# Patient Record
Sex: Female | Born: 1935 | Race: Black or African American | Hispanic: No | Marital: Single | Smoking: Former smoker
Health system: Southern US, Community
[De-identification: ages and names within clinical notes are randomized; demographics above are authoritative.]

## PROBLEM LIST (undated history)

## (undated) DIAGNOSIS — M25511 Pain in right shoulder: Secondary | ICD-10-CM

## (undated) DIAGNOSIS — E785 Hyperlipidemia, unspecified: Secondary | ICD-10-CM

## (undated) DIAGNOSIS — I509 Heart failure, unspecified: Secondary | ICD-10-CM

## (undated) DIAGNOSIS — I4891 Unspecified atrial fibrillation: Secondary | ICD-10-CM

## (undated) DIAGNOSIS — N3281 Overactive bladder: Secondary | ICD-10-CM

## (undated) DIAGNOSIS — R001 Bradycardia, unspecified: Secondary | ICD-10-CM

## (undated) DIAGNOSIS — R319 Hematuria, unspecified: Secondary | ICD-10-CM

## (undated) DIAGNOSIS — C801 Malignant (primary) neoplasm, unspecified: Secondary | ICD-10-CM

## (undated) DIAGNOSIS — J449 Chronic obstructive pulmonary disease, unspecified: Secondary | ICD-10-CM

## (undated) DIAGNOSIS — M549 Dorsalgia, unspecified: Secondary | ICD-10-CM

## (undated) DIAGNOSIS — N189 Chronic kidney disease, unspecified: Secondary | ICD-10-CM

## (undated) DIAGNOSIS — N952 Postmenopausal atrophic vaginitis: Secondary | ICD-10-CM

## (undated) DIAGNOSIS — N390 Urinary tract infection, site not specified: Secondary | ICD-10-CM

## (undated) DIAGNOSIS — E860 Dehydration: Secondary | ICD-10-CM

## (undated) DIAGNOSIS — M109 Gout, unspecified: Secondary | ICD-10-CM

## (undated) DIAGNOSIS — R350 Frequency of micturition: Secondary | ICD-10-CM

## (undated) DIAGNOSIS — G8929 Other chronic pain: Secondary | ICD-10-CM

## (undated) DIAGNOSIS — C50919 Malignant neoplasm of unspecified site of unspecified female breast: Secondary | ICD-10-CM

## (undated) DIAGNOSIS — L8990 Pressure ulcer of unspecified site, unspecified stage: Secondary | ICD-10-CM

## (undated) DIAGNOSIS — IMO0002 Reserved for concepts with insufficient information to code with codable children: Secondary | ICD-10-CM

## (undated) DIAGNOSIS — G8918 Other acute postprocedural pain: Secondary | ICD-10-CM

## (undated) DIAGNOSIS — B1920 Unspecified viral hepatitis C without hepatic coma: Secondary | ICD-10-CM

## (undated) DIAGNOSIS — M25512 Pain in left shoulder: Secondary | ICD-10-CM

## (undated) DIAGNOSIS — M1990 Unspecified osteoarthritis, unspecified site: Secondary | ICD-10-CM

## (undated) DIAGNOSIS — I10 Essential (primary) hypertension: Secondary | ICD-10-CM

## (undated) DIAGNOSIS — I499 Cardiac arrhythmia, unspecified: Secondary | ICD-10-CM

## (undated) DIAGNOSIS — G2581 Restless legs syndrome: Secondary | ICD-10-CM

## (undated) HISTORY — DX: Unspecified viral hepatitis C without hepatic coma: B19.20

## (undated) HISTORY — PX: COLON SURGERY: SHX602

## (undated) HISTORY — PX: BREAST SURGERY: SHX581

## (undated) HISTORY — DX: Hypomagnesemia: E83.42

## (undated) HISTORY — DX: Cardiac arrhythmia, unspecified: I49.9

## (undated) HISTORY — DX: Urinary tract infection, site not specified: N39.0

## (undated) HISTORY — PX: ABDOMINAL HYSTERECTOMY: SHX81

## (undated) HISTORY — PX: BACK SURGERY: SHX140

## (undated) HISTORY — DX: Postmenopausal atrophic vaginitis: N95.2

## (undated) HISTORY — DX: Overactive bladder: N32.81

## (undated) HISTORY — DX: Chronic kidney disease, unspecified: N18.9

## (undated) HISTORY — PX: INSERT / REPLACE / REMOVE PACEMAKER: SUR710

## (undated) HISTORY — DX: Reserved for concepts with insufficient information to code with codable children: IMO0002

## (undated) HISTORY — DX: Hematuria, unspecified: R31.9

## (undated) HISTORY — PX: ROTATOR CUFF REPAIR: SHX139

## (undated) HISTORY — DX: Gout, unspecified: M10.9

## (undated) HISTORY — PX: CHOLECYSTECTOMY: SHX55

## (undated) HISTORY — DX: Other acute postprocedural pain: G89.18

## (undated) HISTORY — DX: Dehydration: E86.0

## (undated) HISTORY — DX: Frequency of micturition: R35.0

## (undated) HISTORY — PX: CARPAL TUNNEL RELEASE: SHX101

## (undated) HISTORY — DX: Bradycardia, unspecified: R00.1

## (undated) HISTORY — PX: JOINT REPLACEMENT: SHX530

---

## 2001-11-01 ENCOUNTER — Encounter: Payer: Self-pay | Admitting: Internal Medicine

## 2001-11-01 ENCOUNTER — Ambulatory Visit (HOSPITAL_COMMUNITY): Admission: RE | Admit: 2001-11-01 | Discharge: 2001-11-01 | Payer: Self-pay | Admitting: Internal Medicine

## 2002-08-23 DIAGNOSIS — C801 Malignant (primary) neoplasm, unspecified: Secondary | ICD-10-CM

## 2002-08-23 DIAGNOSIS — C50919 Malignant neoplasm of unspecified site of unspecified female breast: Secondary | ICD-10-CM

## 2002-08-23 HISTORY — DX: Malignant (primary) neoplasm, unspecified: C80.1

## 2002-08-23 HISTORY — PX: BREAST LUMPECTOMY: SHX2

## 2002-08-23 HISTORY — DX: Malignant neoplasm of unspecified site of unspecified female breast: C50.919

## 2003-03-28 ENCOUNTER — Encounter: Payer: Self-pay | Admitting: Internal Medicine

## 2003-03-28 ENCOUNTER — Ambulatory Visit (HOSPITAL_COMMUNITY): Admission: RE | Admit: 2003-03-28 | Discharge: 2003-03-28 | Payer: Self-pay | Admitting: Internal Medicine

## 2003-04-11 ENCOUNTER — Encounter: Payer: Self-pay | Admitting: Internal Medicine

## 2003-04-11 ENCOUNTER — Ambulatory Visit (HOSPITAL_COMMUNITY): Admission: RE | Admit: 2003-04-11 | Discharge: 2003-04-11 | Payer: Self-pay | Admitting: Internal Medicine

## 2003-04-18 ENCOUNTER — Encounter (INDEPENDENT_AMBULATORY_CARE_PROVIDER_SITE_OTHER): Payer: Self-pay | Admitting: Diagnostic Radiology

## 2003-04-18 ENCOUNTER — Encounter (INDEPENDENT_AMBULATORY_CARE_PROVIDER_SITE_OTHER): Payer: Self-pay

## 2003-04-18 ENCOUNTER — Encounter: Payer: Self-pay | Admitting: Internal Medicine

## 2003-04-18 ENCOUNTER — Encounter: Admission: RE | Admit: 2003-04-18 | Discharge: 2003-04-18 | Payer: Self-pay | Admitting: Internal Medicine

## 2003-08-20 ENCOUNTER — Other Ambulatory Visit: Payer: Self-pay

## 2004-06-15 ENCOUNTER — Ambulatory Visit: Payer: Self-pay | Admitting: Oncology

## 2004-06-23 ENCOUNTER — Ambulatory Visit: Payer: Self-pay | Admitting: Oncology

## 2004-09-01 ENCOUNTER — Ambulatory Visit: Payer: Self-pay | Admitting: Unknown Physician Specialty

## 2004-09-14 ENCOUNTER — Ambulatory Visit: Payer: Self-pay | Admitting: Oncology

## 2004-09-23 ENCOUNTER — Ambulatory Visit: Payer: Self-pay | Admitting: Oncology

## 2004-10-23 ENCOUNTER — Ambulatory Visit: Payer: Self-pay | Admitting: General Surgery

## 2004-12-14 ENCOUNTER — Ambulatory Visit: Payer: Self-pay | Admitting: Oncology

## 2004-12-21 ENCOUNTER — Ambulatory Visit: Payer: Self-pay | Admitting: Oncology

## 2005-02-01 ENCOUNTER — Ambulatory Visit: Payer: Self-pay | Admitting: Oncology

## 2005-05-03 ENCOUNTER — Ambulatory Visit: Payer: Self-pay | Admitting: General Surgery

## 2005-05-05 ENCOUNTER — Ambulatory Visit: Payer: Self-pay | Admitting: Oncology

## 2005-05-23 ENCOUNTER — Ambulatory Visit: Payer: Self-pay | Admitting: Oncology

## 2005-07-29 ENCOUNTER — Ambulatory Visit: Payer: Self-pay | Admitting: Physician Assistant

## 2005-08-10 ENCOUNTER — Ambulatory Visit: Payer: Self-pay | Admitting: Oncology

## 2005-08-23 ENCOUNTER — Ambulatory Visit: Payer: Self-pay | Admitting: Oncology

## 2005-11-09 ENCOUNTER — Ambulatory Visit: Payer: Self-pay | Admitting: Oncology

## 2005-11-23 ENCOUNTER — Ambulatory Visit: Payer: Self-pay | Admitting: General Surgery

## 2005-11-24 ENCOUNTER — Ambulatory Visit: Payer: Self-pay | Admitting: Oncology

## 2005-12-02 ENCOUNTER — Ambulatory Visit: Payer: Self-pay | Admitting: General Surgery

## 2006-01-13 ENCOUNTER — Emergency Department: Payer: Self-pay | Admitting: Emergency Medicine

## 2006-02-07 ENCOUNTER — Ambulatory Visit: Payer: Self-pay | Admitting: Oncology

## 2006-05-09 ENCOUNTER — Ambulatory Visit: Payer: Self-pay | Admitting: Oncology

## 2006-05-23 ENCOUNTER — Ambulatory Visit: Payer: Self-pay | Admitting: Oncology

## 2006-05-23 ENCOUNTER — Ambulatory Visit: Payer: Self-pay | Admitting: General Surgery

## 2006-08-08 ENCOUNTER — Ambulatory Visit: Payer: Self-pay | Admitting: Oncology

## 2006-08-23 ENCOUNTER — Ambulatory Visit: Payer: Self-pay | Admitting: Oncology

## 2006-10-17 ENCOUNTER — Ambulatory Visit: Payer: Self-pay | Admitting: Unknown Physician Specialty

## 2006-11-08 ENCOUNTER — Ambulatory Visit: Payer: Self-pay | Admitting: Oncology

## 2006-11-09 ENCOUNTER — Ambulatory Visit: Payer: Self-pay | Admitting: Oncology

## 2006-11-22 ENCOUNTER — Ambulatory Visit: Payer: Self-pay | Admitting: Oncology

## 2006-11-25 ENCOUNTER — Emergency Department: Payer: Self-pay | Admitting: Unknown Physician Specialty

## 2006-11-25 ENCOUNTER — Other Ambulatory Visit: Payer: Self-pay

## 2006-11-28 ENCOUNTER — Ambulatory Visit: Payer: Self-pay | Admitting: Oncology

## 2006-12-22 ENCOUNTER — Ambulatory Visit: Payer: Self-pay | Admitting: Oncology

## 2007-02-21 ENCOUNTER — Ambulatory Visit: Payer: Self-pay | Admitting: Oncology

## 2007-03-01 ENCOUNTER — Ambulatory Visit: Payer: Self-pay | Admitting: Oncology

## 2007-03-24 ENCOUNTER — Ambulatory Visit: Payer: Self-pay | Admitting: Oncology

## 2007-04-24 ENCOUNTER — Ambulatory Visit: Payer: Self-pay | Admitting: Oncology

## 2007-05-24 ENCOUNTER — Ambulatory Visit: Payer: Self-pay | Admitting: Oncology

## 2007-06-24 ENCOUNTER — Ambulatory Visit: Payer: Self-pay | Admitting: Oncology

## 2007-07-24 ENCOUNTER — Ambulatory Visit: Payer: Self-pay | Admitting: Oncology

## 2007-08-23 ENCOUNTER — Ambulatory Visit: Payer: Self-pay | Admitting: Oncology

## 2007-08-24 ENCOUNTER — Ambulatory Visit: Payer: Self-pay | Admitting: Oncology

## 2007-09-07 ENCOUNTER — Other Ambulatory Visit: Payer: Self-pay

## 2007-09-08 ENCOUNTER — Inpatient Hospital Stay: Payer: Self-pay | Admitting: Internal Medicine

## 2007-11-23 ENCOUNTER — Ambulatory Visit: Payer: Self-pay | Admitting: Oncology

## 2007-12-22 ENCOUNTER — Ambulatory Visit: Payer: Self-pay | Admitting: Oncology

## 2008-02-21 ENCOUNTER — Ambulatory Visit: Payer: Self-pay | Admitting: Oncology

## 2008-02-25 ENCOUNTER — Emergency Department: Payer: Self-pay | Admitting: Emergency Medicine

## 2008-03-26 ENCOUNTER — Ambulatory Visit: Payer: Self-pay | Admitting: Unknown Physician Specialty

## 2008-05-01 ENCOUNTER — Ambulatory Visit: Payer: Self-pay | Admitting: Pain Medicine

## 2008-05-06 ENCOUNTER — Ambulatory Visit: Payer: Self-pay | Admitting: Pain Medicine

## 2008-05-09 ENCOUNTER — Ambulatory Visit: Payer: Self-pay | Admitting: Pain Medicine

## 2008-05-23 ENCOUNTER — Ambulatory Visit: Payer: Self-pay | Admitting: Oncology

## 2008-06-05 ENCOUNTER — Ambulatory Visit: Payer: Self-pay | Admitting: General Surgery

## 2008-06-06 ENCOUNTER — Ambulatory Visit: Payer: Self-pay | Admitting: Physician Assistant

## 2008-06-13 ENCOUNTER — Ambulatory Visit: Payer: Self-pay | Admitting: Pain Medicine

## 2008-06-20 ENCOUNTER — Ambulatory Visit: Payer: Self-pay | Admitting: Oncology

## 2008-06-23 ENCOUNTER — Ambulatory Visit: Payer: Self-pay | Admitting: Oncology

## 2008-06-27 ENCOUNTER — Ambulatory Visit: Payer: Self-pay | Admitting: Pain Medicine

## 2008-07-11 ENCOUNTER — Ambulatory Visit: Payer: Self-pay | Admitting: Physician Assistant

## 2008-07-11 ENCOUNTER — Ambulatory Visit: Payer: Self-pay | Admitting: Pain Medicine

## 2008-07-30 ENCOUNTER — Ambulatory Visit: Payer: Self-pay | Admitting: Pain Medicine

## 2008-08-26 ENCOUNTER — Ambulatory Visit: Payer: Self-pay | Admitting: Pain Medicine

## 2008-10-07 ENCOUNTER — Ambulatory Visit: Payer: Self-pay | Admitting: Pain Medicine

## 2008-10-24 ENCOUNTER — Ambulatory Visit: Payer: Self-pay | Admitting: Pain Medicine

## 2008-10-28 ENCOUNTER — Ambulatory Visit: Payer: Self-pay | Admitting: Unknown Physician Specialty

## 2008-11-07 ENCOUNTER — Ambulatory Visit: Payer: Self-pay | Admitting: Physician Assistant

## 2008-12-16 ENCOUNTER — Ambulatory Visit: Payer: Self-pay | Admitting: Physician Assistant

## 2008-12-24 ENCOUNTER — Ambulatory Visit: Payer: Self-pay | Admitting: Pain Medicine

## 2009-01-07 ENCOUNTER — Ambulatory Visit: Payer: Self-pay | Admitting: Physician Assistant

## 2009-02-05 ENCOUNTER — Ambulatory Visit: Payer: Self-pay | Admitting: Physician Assistant

## 2009-02-11 ENCOUNTER — Ambulatory Visit: Payer: Self-pay | Admitting: Pain Medicine

## 2009-03-03 ENCOUNTER — Ambulatory Visit: Payer: Self-pay | Admitting: Pain Medicine

## 2009-03-17 ENCOUNTER — Ambulatory Visit: Payer: Self-pay | Admitting: Pain Medicine

## 2009-04-01 ENCOUNTER — Ambulatory Visit: Payer: Self-pay | Admitting: Pain Medicine

## 2009-04-02 ENCOUNTER — Ambulatory Visit: Payer: Self-pay | Admitting: Cardiovascular Disease

## 2009-04-02 ENCOUNTER — Ambulatory Visit: Payer: Self-pay | Admitting: Unknown Physician Specialty

## 2009-04-08 ENCOUNTER — Inpatient Hospital Stay: Payer: Self-pay | Admitting: Unknown Physician Specialty

## 2009-04-16 ENCOUNTER — Ambulatory Visit: Payer: Self-pay | Admitting: Physician Assistant

## 2009-05-07 ENCOUNTER — Encounter: Payer: Self-pay | Admitting: Unknown Physician Specialty

## 2009-05-08 ENCOUNTER — Ambulatory Visit: Payer: Self-pay | Admitting: Physician Assistant

## 2009-05-23 ENCOUNTER — Ambulatory Visit: Payer: Self-pay | Admitting: Oncology

## 2009-05-26 ENCOUNTER — Ambulatory Visit: Payer: Self-pay | Admitting: Oncology

## 2009-05-27 ENCOUNTER — Encounter: Payer: Self-pay | Admitting: Unknown Physician Specialty

## 2009-06-04 ENCOUNTER — Ambulatory Visit: Payer: Self-pay | Admitting: Physician Assistant

## 2009-06-09 ENCOUNTER — Ambulatory Visit: Payer: Self-pay | Admitting: General Surgery

## 2009-06-23 ENCOUNTER — Ambulatory Visit: Payer: Self-pay | Admitting: Oncology

## 2009-06-25 ENCOUNTER — Encounter: Payer: Self-pay | Admitting: Unknown Physician Specialty

## 2009-07-10 ENCOUNTER — Ambulatory Visit: Payer: Self-pay | Admitting: Physician Assistant

## 2009-07-21 ENCOUNTER — Ambulatory Visit: Payer: Self-pay | Admitting: Unknown Physician Specialty

## 2009-07-22 ENCOUNTER — Ambulatory Visit: Payer: Self-pay | Admitting: Pain Medicine

## 2009-07-24 ENCOUNTER — Encounter: Payer: Self-pay | Admitting: Unknown Physician Specialty

## 2009-08-05 ENCOUNTER — Ambulatory Visit: Payer: Self-pay | Admitting: Cardiology

## 2009-08-05 ENCOUNTER — Ambulatory Visit: Payer: Self-pay | Admitting: Unknown Physician Specialty

## 2009-08-07 ENCOUNTER — Inpatient Hospital Stay: Payer: Self-pay | Admitting: Unknown Physician Specialty

## 2009-08-23 ENCOUNTER — Encounter: Payer: Self-pay | Admitting: Unknown Physician Specialty

## 2009-09-04 ENCOUNTER — Encounter: Payer: Self-pay | Admitting: Unknown Physician Specialty

## 2009-09-11 ENCOUNTER — Ambulatory Visit: Payer: Self-pay | Admitting: Physician Assistant

## 2009-09-23 ENCOUNTER — Encounter: Payer: Self-pay | Admitting: Unknown Physician Specialty

## 2009-10-06 ENCOUNTER — Ambulatory Visit: Payer: Self-pay | Admitting: Pain Medicine

## 2009-10-21 ENCOUNTER — Encounter: Payer: Self-pay | Admitting: Unknown Physician Specialty

## 2009-11-21 ENCOUNTER — Ambulatory Visit: Payer: Self-pay | Admitting: Oncology

## 2009-11-24 ENCOUNTER — Ambulatory Visit: Payer: Self-pay | Admitting: Oncology

## 2009-12-21 ENCOUNTER — Ambulatory Visit: Payer: Self-pay | Admitting: Oncology

## 2010-02-26 ENCOUNTER — Ambulatory Visit: Payer: Self-pay | Admitting: Pain Medicine

## 2010-06-10 ENCOUNTER — Ambulatory Visit: Payer: Self-pay | Admitting: Oncology

## 2010-06-12 ENCOUNTER — Ambulatory Visit: Payer: Self-pay | Admitting: Oncology

## 2010-06-19 ENCOUNTER — Ambulatory Visit: Payer: Self-pay | Admitting: Oncology

## 2010-06-23 ENCOUNTER — Ambulatory Visit: Payer: Self-pay | Admitting: Oncology

## 2010-07-06 ENCOUNTER — Ambulatory Visit: Payer: Self-pay | Admitting: Pain Medicine

## 2010-07-09 ENCOUNTER — Ambulatory Visit: Payer: Self-pay | Admitting: Pain Medicine

## 2010-07-27 ENCOUNTER — Ambulatory Visit: Payer: Self-pay | Admitting: Pain Medicine

## 2010-07-27 ENCOUNTER — Ambulatory Visit: Payer: Self-pay | Admitting: Oncology

## 2010-07-30 ENCOUNTER — Ambulatory Visit: Payer: Self-pay | Admitting: Oncology

## 2010-08-23 ENCOUNTER — Ambulatory Visit: Payer: Self-pay | Admitting: Oncology

## 2010-08-27 ENCOUNTER — Ambulatory Visit: Payer: Self-pay | Admitting: Pain Medicine

## 2010-09-11 ENCOUNTER — Ambulatory Visit: Payer: Self-pay | Admitting: Oncology

## 2010-09-12 LAB — CANCER ANTIGEN 27.29: CA 27.29: 90.4 U/mL — ABNORMAL HIGH (ref 0.0–38.6)

## 2010-09-23 ENCOUNTER — Ambulatory Visit: Payer: Self-pay | Admitting: Oncology

## 2010-09-28 ENCOUNTER — Ambulatory Visit: Payer: Self-pay | Admitting: Pain Medicine

## 2010-10-06 ENCOUNTER — Ambulatory Visit: Payer: Self-pay | Admitting: Pain Medicine

## 2010-10-13 ENCOUNTER — Ambulatory Visit: Payer: Self-pay | Admitting: Oncology

## 2010-10-14 LAB — CANCER ANTIGEN 27.29: CA 27.29: 68.3 U/mL — ABNORMAL HIGH (ref 0.0–38.6)

## 2010-10-19 ENCOUNTER — Ambulatory Visit: Payer: Self-pay | Admitting: Pain Medicine

## 2010-10-22 ENCOUNTER — Ambulatory Visit: Payer: Self-pay | Admitting: Oncology

## 2010-12-28 ENCOUNTER — Other Ambulatory Visit: Payer: Self-pay | Admitting: Orthopedic Surgery

## 2010-12-28 DIAGNOSIS — M25512 Pain in left shoulder: Secondary | ICD-10-CM

## 2011-01-04 ENCOUNTER — Ambulatory Visit
Admission: RE | Admit: 2011-01-04 | Discharge: 2011-01-04 | Disposition: A | Discharge status: Home / Self Care | Payer: Medicare Other | Source: Ambulatory Visit | Attending: Orthopedic Surgery | Admitting: Orthopedic Surgery

## 2011-01-04 DIAGNOSIS — M25512 Pain in left shoulder: Secondary | ICD-10-CM

## 2011-01-11 ENCOUNTER — Ambulatory Visit: Payer: Self-pay | Admitting: Pain Medicine

## 2011-01-11 ENCOUNTER — Ambulatory Visit: Payer: Self-pay | Admitting: Oncology

## 2011-01-12 LAB — CANCER ANTIGEN 27.29: CA 27.29: 72.7 U/mL — ABNORMAL HIGH (ref 0.0–38.6)

## 2011-01-21 ENCOUNTER — Ambulatory Visit: Payer: Self-pay | Admitting: Pain Medicine

## 2011-01-22 ENCOUNTER — Ambulatory Visit: Payer: Self-pay | Admitting: Oncology

## 2011-01-28 ENCOUNTER — Other Ambulatory Visit: Payer: Self-pay | Admitting: Pain Medicine

## 2011-01-29 ENCOUNTER — Ambulatory Visit: Payer: Self-pay | Admitting: Oncology

## 2011-02-16 ENCOUNTER — Other Ambulatory Visit: Payer: Self-pay | Admitting: Pain Medicine

## 2011-02-17 ENCOUNTER — Encounter (HOSPITAL_COMMUNITY)
Admission: RE | Admit: 2011-02-17 | Discharge: 2011-02-17 | Disposition: A | Discharge status: Home / Self Care | Payer: Medicare Other | Source: Ambulatory Visit | Attending: Orthopedic Surgery | Admitting: Orthopedic Surgery

## 2011-02-17 LAB — URINALYSIS, ROUTINE W REFLEX MICROSCOPIC
Hgb urine dipstick: NEGATIVE
Nitrite: NEGATIVE
Protein, ur: NEGATIVE mg/dL
Specific Gravity, Urine: 1.022 (ref 1.005–1.030)
Urobilinogen, UA: 0.2 mg/dL (ref 0.0–1.0)

## 2011-02-17 LAB — CBC
HCT: 38.4 % (ref 36.0–46.0)
Hemoglobin: 12.6 g/dL (ref 12.0–15.0)
MCHC: 32.8 g/dL (ref 30.0–36.0)
RBC: 4.22 MIL/uL (ref 3.87–5.11)

## 2011-02-17 LAB — BASIC METABOLIC PANEL
BUN: 12 mg/dL (ref 6–23)
CO2: 26 mEq/L (ref 19–32)
Calcium: 9.9 mg/dL (ref 8.4–10.5)
Chloride: 106 mEq/L (ref 96–112)
Creatinine, Ser: 0.9 mg/dL (ref 0.50–1.10)
Glucose, Bld: 88 mg/dL (ref 70–99)

## 2011-02-17 LAB — URINE MICROSCOPIC-ADD ON

## 2011-02-17 LAB — APTT: aPTT: 32 seconds (ref 24–37)

## 2011-02-17 LAB — PROTIME-INR: INR: 1.07 (ref 0.00–1.49)

## 2011-02-17 LAB — DIFFERENTIAL
Basophils Absolute: 0.1 10*3/uL (ref 0.0–0.1)
Lymphocytes Relative: 36 % (ref 12–46)
Monocytes Absolute: 0.7 10*3/uL (ref 0.1–1.0)
Neutro Abs: 4.4 10*3/uL (ref 1.7–7.7)
Neutrophils Relative %: 51 % (ref 43–77)

## 2011-02-19 ENCOUNTER — Ambulatory Visit (HOSPITAL_COMMUNITY): Payer: Medicare Other

## 2011-02-19 ENCOUNTER — Inpatient Hospital Stay (HOSPITAL_COMMUNITY)
Admission: RE | Admit: 2011-02-19 | Discharge: 2011-02-21 | DRG: 484 | Disposition: A | Discharge status: Home / Self Care | Payer: Medicare Other | Source: Ambulatory Visit | Attending: Orthopedic Surgery | Admitting: Orthopedic Surgery

## 2011-02-19 DIAGNOSIS — M719 Bursopathy, unspecified: Secondary | ICD-10-CM | POA: Diagnosis present

## 2011-02-19 DIAGNOSIS — M19019 Primary osteoarthritis, unspecified shoulder: Principal | ICD-10-CM | POA: Diagnosis present

## 2011-02-19 DIAGNOSIS — M67919 Unspecified disorder of synovium and tendon, unspecified shoulder: Secondary | ICD-10-CM | POA: Diagnosis present

## 2011-02-19 DIAGNOSIS — Z01812 Encounter for preprocedural laboratory examination: Secondary | ICD-10-CM

## 2011-02-19 DIAGNOSIS — Z79899 Other long term (current) drug therapy: Secondary | ICD-10-CM

## 2011-02-20 LAB — BASIC METABOLIC PANEL
BUN: 12 mg/dL (ref 6–23)
CO2: 24 mEq/L (ref 19–32)
Calcium: 9 mg/dL (ref 8.4–10.5)
Chloride: 108 mEq/L (ref 96–112)
Creatinine, Ser: 0.92 mg/dL (ref 0.50–1.10)

## 2011-02-20 LAB — TYPE AND SCREEN
DAT, IgG: NEGATIVE
PT AG Type: NEGATIVE
Unit division: 0
Unit division: 0

## 2011-02-20 LAB — CBC
HCT: 34.2 % — ABNORMAL LOW (ref 36.0–46.0)
MCH: 29.3 pg (ref 26.0–34.0)
MCV: 90.2 fL (ref 78.0–100.0)
Platelets: 179 10*3/uL (ref 150–400)
RBC: 3.79 MIL/uL — ABNORMAL LOW (ref 3.87–5.11)
WBC: 9.1 10*3/uL (ref 4.0–10.5)

## 2011-03-12 NOTE — Op Note (Signed)
NAMESOOJIN, FARAHANI                 ACCOUNT NO.:  [id number]  MEDICAL RECORD NO.:  [id number]  LOCATION:  [id number]                         FACILITY:  [facility]  PHYSICIAN:  Doran Heater. Veverly Fells, M.D. DATE OF BIRTH:  1930-06-05  DATE OF PROCEDURE:  02/19/2011 DATE OF DISCHARGE:                              OPERATIVE REPORT   PREOPERATIVE DIAGNOSIS:  Left shoulder rotator cuff tear arthropathy.  POSTOPERATIVE DIAGNOSIS:  Left shoulder rotator cuff tear arthropathy.  PROCEDURE PERFORMED:  Left shoulder reverse total shoulder arthroplasty using DePuy Delta Xtend prosthesis.  SURGEON:  Doran Heater. Veverly Fells, MD  ASSISTANT:  Abbott Pao. Dixon, PA-C  ANESTHESIA:  General anesthesia was used plus interscalene block.  ESTIMATED BLOOD LOSS:  Minimal.  FLUID REPLACEMENT:  1200 mL crystalloid.  Instrument counts correct.  There were no complications.  Perioperative antibiotics were given.  INDICATION:  The patient is a 77 year old female with worsening left shoulder pain secondary to rotator cuff tear arthropathy.  The patient has had progressive pain despite conservative management and has a very little function of that shoulder.  The patient also has had prior right shoulder surgery and has poor function with that.  She presents for evaluation of her left shoulder and for total shoulder arthroplasty, reverse to improve function and eliminate pain.  Informed consent was obtained.  DESCRIPTION OF PROCEDURE:  After adequate level of anesthesia was achieved, the patient was positioned in the modified beach-chair position.  Left shoulder was properly identified.  Time-out was called and then sterile prep and drape performed.  A deltopectoral approach utilized during the coracoid process extending down to the anterior humeral shaft.  Dissection down through subcutaneous tissues using Bovie.  The deltopectoral interval was identified and the cephalic vein was taken laterally the deltoid.  Upper  centimeter pectoralis insertion of the humerus was released.  Conjoined tendon identified, mobilized medially.  Subscapularis was taken down subperiosteally off the lesser tuberosity and tagged with the FiberWire suture.  We then performed anterior inferior capsulectomy and released off the humeral neck and also released a little bit of the anterior supraspinatus tendon.  There was some degenerative tendon that was nonfunctional and was allowing the patient to articulate with the acromion that was removed.  The teres minor was kept intact posteriorly.  At this point, we delivered the head on the wound and reamed up to size 12 reamer and then placed our cutting block off of it and a 12 intramedullary guide and resected at 10 degrees retroversion.  We next went ahead and retract the humerus posteriorly, did a 360-degree release of soft tissue off the glenoid careful to protect the axillary nerve.  Next, we went ahead and placed our central guide pin and then reamed down to try to get sufficient bone support for the metaglene and it should be noted the patient had a false basically acetabularized superior glenoid and so we could not get all the way down to where we had 360-degree support for the metaglene, but greater than 190 support was felt to be sufficient with three good screws and then a screw functioning like the pillar on the top part that we locked.  We again got  down as far as we felt like we could we gone any further would compromise the inferior bone and support for the metaglene.  We went and ahead and drilled our central lug, our central peg hole and then impacted our metaglene in position, again had three nice flush screw holes, the inferior three, superior one was off about a millimeter 2. We went ahead and then placed three locked screws of 48 inferior lock screw an then two nonlocked anterior-posterior 18 screws and then another 30 locked proximally.  Once we locked those  screws, we had a nice secure metaglene, we then went ahead and inserted a 38 standard glenosphere and then tied that in position.  We then directed our attention towards the humerus.  We reamed for a one at the left size 12 stem and then and placed that trial prosthesis 10 degrees retroverted and put a 38 +3 poly and reduced the shoulder and we were happy with soft tissue balancing intention.  We dislocated that.  Went ahead and placed our real size 12 hydroxyapatite coated with a EPI1 left metaphyseal portion tightened that into position and impacted that down with some impaction grafting using available bone graft.  We had a nice secure fit with no significant bony osteophytes were noted.  At this point, we went ahead and impacted our +3 38 polyethylene into place and then reduced the shoulder, thoroughly irrigated and then closed the deltopectoral interval and we noted the conjoined tendon be appropriate tension.  No gapping with the inferior pole or with external rotation. We repaired the deltopectoral interval with 0 Vicryl suture followed by 2-0 Vicryl subcutaneous closure and 4-0 Monocryl for skin.  Steri-Strips applied followed by sterile dressing.  The patient tolerated surgery well.  I should note repair of the subscapularis back through bone with the #2 FiberWire suture.  The subscap appeared to be in very good shape with nice muscle that balance and felt like that would add some increased to enhance stability and potentially increase function, that was repaired anatomically to the lesser tuberosity.     Doran Heater. Veverly Fells, M.D.     SRN/MEDQ  D:  02/19/2011  T:  02/20/2011  Job:  [id number]  Electronically Signed by Esmond Plants  on 03/12/2011 12:07:38 AM

## 2011-04-09 NOTE — Discharge Summary (Signed)
  Kristin [name], Kristin [name]                 ACCOUNT NO.:  [id number]  MEDICAL RECORD NO.:  [id number]  LOCATION:                                 FACILITY:  PHYSICIAN:  Doran Heater. Veverly Fells, M.D. DATE OF BIRTH:  1930/07/23  DATE OF ADMISSION: DATE OF DISCHARGE:                              DISCHARGE SUMMARY   ADMISSION DIAGNOSIS:  Left shoulder pain secondary to rotator cuff arthropathy.  DISCHARGE DIAGNOSIS:  Left shoulder pain secondary to rotator cuff arthropathy.  BRIEF HISTORY:  The patient is a 77 year old female with worsening left shoulder pain and function secondary to rotator cuff arthropathy.  The patient was elected to have a left total shoulder arthroplasty reversed to decrease pain and increase function.  PROCEDURE:  The patient had a left total shoulder arthroplasty reversed performed by Dr. Esmond Plants on February 19, 2011.  ASSISTANT:  Abbott Pao. Dixon, PA-C  ANESTHESIA:  General anesthesia was used.  COMPLICATIONS:  None.  HOSPITAL COURSE:  The patient was admitted on February 19, 2011, for the above-stated procedure which she tolerated well.  After adequate time in [facility], she was transferred up to 5000.  Postop day 1, the patient complained about minimal pain to that shoulder. Neurovascularly, she is intact.  Labs were in acceptable limits.  She was able to work with occupational and physical therapy quite well over the next couple of days and thus the patient will be discharged home on postop day 2, on February 21, 2011.  CONDITION:  Stable.  DIET:  Regular.  DISCHARGE MEDICATIONS: 1. Percocet 5/325 one to two tabs q.4-6 h. p.r.n. pain. 2. Robaxin 500 mg p.o. q.6 h.     Thomas B. Dixon, P.A.   ______________________________ Doran Heater. Veverly Fells, M.D.    TBD/MEDQ  D:  03/11/2011  T:  03/11/2011  Job:  [id number]  Electronically Signed by Shelle Iron P.A. on 03/16/2011 07:52:24 AM Electronically Signed by Esmond Plants  on 04/09/2011 10:15:57 AM

## 2011-04-12 ENCOUNTER — Ambulatory Visit: Payer: Self-pay | Admitting: Pain Medicine

## 2011-05-21 ENCOUNTER — Ambulatory Visit: Payer: Self-pay | Admitting: Oncology

## 2011-05-24 ENCOUNTER — Ambulatory Visit: Payer: Self-pay | Admitting: Oncology

## 2011-06-07 ENCOUNTER — Ambulatory Visit: Payer: Self-pay | Admitting: Pain Medicine

## 2011-06-29 ENCOUNTER — Ambulatory Visit: Payer: Self-pay | Admitting: Oncology

## 2011-08-23 ENCOUNTER — Ambulatory Visit: Payer: Self-pay | Admitting: Neurological Surgery

## 2011-08-31 ENCOUNTER — Ambulatory Visit: Payer: Self-pay | Admitting: Neurological Surgery

## 2011-09-20 ENCOUNTER — Ambulatory Visit: Payer: Self-pay | Admitting: Pain Medicine

## 2011-10-15 ENCOUNTER — Encounter (HOSPITAL_COMMUNITY): Payer: Self-pay

## 2011-10-15 ENCOUNTER — Other Ambulatory Visit: Payer: Self-pay

## 2011-10-15 ENCOUNTER — Encounter (HOSPITAL_COMMUNITY)
Admission: RE | Admit: 2011-10-15 | Discharge: 2011-10-15 | Disposition: A | Discharge status: Home / Self Care | Payer: Medicare Other | Source: Ambulatory Visit | Attending: Ophthalmology | Admitting: Ophthalmology

## 2011-10-15 ENCOUNTER — Encounter (HOSPITAL_COMMUNITY): Payer: Self-pay | Admitting: Pharmacy Technician

## 2011-10-15 HISTORY — DX: Other chronic pain: G89.29

## 2011-10-15 HISTORY — DX: Dorsalgia, unspecified: M54.9

## 2011-10-15 HISTORY — DX: Chronic obstructive pulmonary disease, unspecified: J44.9

## 2011-10-15 HISTORY — DX: Essential (primary) hypertension: I10

## 2011-10-15 HISTORY — DX: Unspecified osteoarthritis, unspecified site: M19.90

## 2011-10-15 HISTORY — DX: Malignant (primary) neoplasm, unspecified: C80.1

## 2011-10-15 HISTORY — DX: Hyperlipidemia, unspecified: E78.5

## 2011-10-15 LAB — DIFFERENTIAL
Basophils Absolute: 0 10*3/uL (ref 0.0–0.1)
Basophils Relative: 1 % (ref 0–1)
Eosinophils Absolute: 0.3 10*3/uL (ref 0.0–0.7)
Eosinophils Relative: 4 % (ref 0–5)
Monocytes Absolute: 0.5 10*3/uL (ref 0.1–1.0)

## 2011-10-15 LAB — BASIC METABOLIC PANEL
CO2: 24 mEq/L (ref 19–32)
Calcium: 10 mg/dL (ref 8.4–10.5)
Creatinine, Ser: 0.98 mg/dL (ref 0.50–1.10)
GFR calc Af Amer: 62 mL/min — ABNORMAL LOW (ref >90)
GFR calc non Af Amer: 53 mL/min — ABNORMAL LOW (ref >90)
Sodium: 140 mEq/L (ref 135–145)

## 2011-10-15 LAB — CBC
HCT: 38.1 % (ref 36.0–46.0)
MCH: 29.9 pg (ref 26.0–34.0)
MCHC: 32 g/dL (ref 30.0–36.0)
MCV: 93.4 fL (ref 78.0–100.0)
Platelets: 156 10*3/uL (ref 150–400)
RDW: 14.6 % (ref 11.5–15.5)
WBC: 6.1 10*3/uL (ref 4.0–10.5)

## 2011-10-15 NOTE — Patient Instructions (Addendum)
Kristin [name]  10/15/2011   Your procedure is scheduled on:  10/19/11  Report to [facility] at 06:15 AM.  Call this number if you have problems the morning of surgery: [phone]   Remember:   Do not eat food:After Midnight.  May have clear liquids:until Midnight .  Clear liquids include soda, tea, black coffee, apple or grape juice, broth.  Take these medicines the morning of surgery with A SIP OF WATER: Allopurinol, Carvedilol and Lisinopril. Take your Oxycodone and Ropinirole only if needed. Also, use your inhaler, Albuterol.   Do not wear jewelry, make-up or nail polish.  Do not wear lotions, powders, or perfumes. You may wear deodorant.  Do not shave 48 hours prior to surgery.  Do not bring valuables to the hospital.  Contacts, dentures or bridgework may not be worn into surgery.  Leave suitcase in the car. After surgery it may be brought to your room.  For patients admitted to the hospital, checkout time is 11:00 AM the day of discharge.   Patients discharged the day of surgery will not be allowed to drive home.  Name and phone number of your driver:   Special Instructions: N/A   Please read over the following fact sheets that you were given: Anesthesia Post-op Instructions and Care and Recovery After Surgery   Cataract A cataract is a clouding of the lens of the eye. It is most often related to aging. A cataract is not a "film" over the surface of the eye. The lens is inside the eye and changes size of the pupil. The lens can enlarge to let more light enter the eye in dark environments and contract the size of the pupil to let in bright light. The lens is the part of the eye that helps focus light on the retina. The retina is the eye's light-sensitive layer. It is in the back of the eye that sends visual signals to the brain. In a normal eye, light passes through the lens and gets focused on the retina. To help produce a sharp image, the lens must remain clear. When a lens  becomes cloudy, vision is compromised by the degree and nature of the clouding. Certain cataracts make people more near-sighted as they develop, others increase glare, and all reduce vision to some degree or another. A cataract that is so dense that it becomes milky Kemo Spruce and a Dae Highley opacity can be seen through the pupil. When the Zeta Bucy color is seen, it is called a "mature" or "hyper-mature cataract." Such cataracts cause total blindness in the affected eye. The cataract must be removed to prevent damage to the eye itself. Some types of cataracts can cause a secondary disease of the eye, such as certain types of glaucoma. In the early stages, better lighting and eyeglasses may lessen vision problems caused by cataracts. At a certain point, surgery may be needed to improve vision. CAUSES   Aging. However, cataracts may occur at any age, even in newborns.   Certain drugs.   Trauma to the eye.   Certain diseases (such as diabetes).   Inherited or acquired medical syndromes.  SYMPTOMS   Gradual, progressive drop in vision in the affected eye. Cataracts may develop at different rates in each eye. Cataracts may even be in just one eye with the other unaffected.   Cataracts due to trauma may develop quickly, sometimes over a matter or days or even hours. The result is severe and rapid visual loss.  DIAGNOSIS  To detect a cataract, an eye doctor examines the lens. A well developed cataract can be diagnosed without dilating the pupil. Early cataracts and others of a specific nature are best diagnosed with an exam of the eyes with the pupils dilated by drops. TREATMENT   For an early cataract, vision may improve by using different eyeglasses or stronger lighting.   If the above measures do not help, surgery is the only effective treatment. This treatment removes the cloudy lens and replaces it with a substitute lens (Intraocular lens, or IOL). Newly developed IOL technology allows the implanted lens  to improve vision both at a distance and up close. Discuss with your eye surgeon about the possibility of still needing glasses. Also discuss how visual coordination between both eyes will be affected.  A cataract needs to be removed only when vision loss interferes with your everyday activities such as driving, reading or watching TV. You and your eye doctor can make that decision together. In most cases, waiting until you are ready to have cataract surgery will not harm your eye. If you have cataracts in both eyes, only one should be removed at a time. This allows the operated eye to heal and be out of danger from serious problems (such as infection or poor wound healing) before having the other eye undergo surgery.  Sometimes, a cataract should be removed even if it does not cause problems with your vision. For example, a cataract should be removed if it prevents examination or treatment of another eye problem. Just as you cannot see out of the affected eye well, your doctor cannot see into your eye well through a cataract. The vast majority of people who have cataract surgery have better vision afterward. CATARACT REMOVAL There are two primary ways to remove a cataract. Your doctor can explain the differences and help determine which is best for you:  Phacoemulsification (small incision cataract surgery). This involves making a small cut (incision) on the edge of the clear, dome-shaped surface that covers the front of the eye (the cornea). An injection behind the eye or eye drops are given to make this a painless procedure. The doctor then inserts a tiny probe into the eye. This device emits ultrasound waves that soften and break up the cloudy center of the lens so it can be removed by suction. Most cataract surgery is done this way. The cuts are usually so small and performed in such a manner that often no sutures are needed to keep it closed.   Extracapsular surgery. Your doctor makes a slightly  longer incision on the side of the cornea. The doctor removes the hard center of the lens. The remainder of the lens is then removed by suction. In some cases, extremely fine sutures are needed which the doctor may, or may not remove in the office after the surgery.  When an IOL is implanted, it needs no care. It becomes a permanent part of your eye and cannot be seen or felt.  Some people cannot have an IOL. They may have problems during surgery, or maybe they have another eye disease. For these people, a soft contact lens may be suggested. If an IOL or contact lens cannot be used, very powerful and thick glasses are required after surgery. Since vision is very different through such thick glasses, it is important to have your doctor discuss the impact on your vision after any cataract surgery where there is no plan to implant an IOL. The normal lens  of the eye is covered by a clear capsule. Both phacoemulsification and extracapsular surgery require that the back surface of this lens capsule be left in place. This helps support IOLs and prevents the IOL from dislocating and falling back into the deeper interior of the eye. Right after surgery, and often permanently this "posterior capsule" remains clear. In some cases however, it can become cloudy, presenting the same type of visual compromise that the original cataract did since light is again obstructed as it passes through the clear IOL. This condition is often referred to as an "after-cataract." Fortunately, after-cataracts are easily treated using a painless and very fast laser treatment that is performed without anesthesia or incisions. It is done in a matter of minutes in an outpatient environment. Visual improvement is often immediate.  HOME CARE INSTRUCTIONS   Your surgeon will discuss pre and post operative care with you prior to surgery. The majority of people are able to do almost all normal activities right away. Although, it is often advised to  avoid strenuous activity for a period of time.   Postoperative drops and careful avoidance of infection will be needed. Many surgeons suggest the use of a protective shield during the first few days after surgery.   There is a very small incidence of complication from modern cataract surgery, but it can happen. Infection that spreads to the inside of the eye (endophthalmitis) can result in total visual loss and even loss of the eye itself. In extremely rare instances, the inflammation of endophthalmitis can spread to both eyes (sympathetic ophthalmia). Appropriate post-operative care under the close observation of your surgeon is essential to a successful outcome.  SEEK IMMEDIATE MEDICAL CARE IF:   You have any sudden drop of vision in the operated eye.   You have pain in the operated eye.   You see a large number of floating dots in the field of vision in the operated eye.   You see flashing lights, or if a portion of your side vision in any direction appears black (like a curtain being drawn into your field of vision) in the operated eye.  Document Released: 08/09/2005 Document Revised: 04/21/2011 Document Reviewed: 09/25/2007 [facility] Patient Information 2012 [place].   PATIENT INSTRUCTIONS POST-ANESTHESIA  IMMEDIATELY FOLLOWING SURGERY:  Do not drive or operate machinery for the first twenty four hours after surgery.  Do not make any important decisions for twenty four hours after surgery or while taking narcotic pain medications or sedatives.  If you develop intractable nausea and vomiting or a severe headache please notify your doctor immediately.  FOLLOW-UP:  Please make an appointment with your surgeon as instructed. You do not need to follow up with anesthesia unless specifically instructed to do so.  WOUND CARE INSTRUCTIONS (if applicable):  Keep a dry clean dressing on the anesthesia/puncture wound site if there is drainage.  Once the wound has quit draining you may leave  it open to air.  Generally you should leave the bandage intact for twenty four hours unless there is drainage.  If the epidural site drains for more than 36-48 hours please call the anesthesia department.  QUESTIONS?:  Please feel free to call your physician or the hospital operator if you have any questions, and they will be happy to assist you.     [place] [phone]

## 2011-10-18 MED ORDER — TETRACAINE HCL 0.5 % OP SOLN
OPHTHALMIC | Status: AC
  Administered 2011-10-19: 1 [drp] via OPHTHALMIC
  Filled 2011-10-18: qty 2

## 2011-10-18 MED ORDER — FLURBIPROFEN SODIUM 0.03 % OP SOLN
OPHTHALMIC | Status: AC
  Administered 2011-10-19: 1 [drp] via OPHTHALMIC
  Filled 2011-10-18: qty 2.5

## 2011-10-18 MED ORDER — CYCLOPENTOLATE-PHENYLEPHRINE 0.2-1 % OP SOLN
OPHTHALMIC | Status: AC
  Administered 2011-10-19: 1 [drp] via OPHTHALMIC
  Filled 2011-10-18: qty 2

## 2011-10-19 ENCOUNTER — Encounter (HOSPITAL_COMMUNITY)
Admission: RE | Disposition: A | Discharge status: Home / Self Care | Payer: Self-pay | Source: Ambulatory Visit | Attending: Ophthalmology

## 2011-10-19 ENCOUNTER — Encounter (HOSPITAL_COMMUNITY): Payer: Self-pay | Admitting: *Deleted

## 2011-10-19 ENCOUNTER — Ambulatory Visit (HOSPITAL_COMMUNITY)
Admission: RE | Admit: 2011-10-19 | Discharge: 2011-10-19 | Disposition: A | Discharge status: Home / Self Care | Payer: Medicare Other | Source: Ambulatory Visit | Attending: Ophthalmology | Admitting: Ophthalmology

## 2011-10-19 ENCOUNTER — Encounter (HOSPITAL_COMMUNITY): Payer: Self-pay | Admitting: Anesthesiology

## 2011-10-19 ENCOUNTER — Ambulatory Visit (HOSPITAL_COMMUNITY): Payer: Medicare Other | Admitting: Anesthesiology

## 2011-10-19 DIAGNOSIS — J449 Chronic obstructive pulmonary disease, unspecified: Secondary | ICD-10-CM | POA: Insufficient documentation

## 2011-10-19 DIAGNOSIS — Z01810 Encounter for preprocedural cardiovascular examination: Secondary | ICD-10-CM | POA: Insufficient documentation

## 2011-10-19 DIAGNOSIS — J4489 Other specified chronic obstructive pulmonary disease: Secondary | ICD-10-CM | POA: Insufficient documentation

## 2011-10-19 DIAGNOSIS — Z79899 Other long term (current) drug therapy: Secondary | ICD-10-CM | POA: Insufficient documentation

## 2011-10-19 DIAGNOSIS — H2510 Age-related nuclear cataract, unspecified eye: Secondary | ICD-10-CM | POA: Insufficient documentation

## 2011-10-19 DIAGNOSIS — Z01812 Encounter for preprocedural laboratory examination: Secondary | ICD-10-CM | POA: Insufficient documentation

## 2011-10-19 DIAGNOSIS — I10 Essential (primary) hypertension: Secondary | ICD-10-CM | POA: Insufficient documentation

## 2011-10-19 HISTORY — PX: CATARACT EXTRACTION W/PHACO: SHX586

## 2011-10-19 SURGERY — PHACOEMULSIFICATION, CATARACT, WITH IOL INSERTION
Anesthesia: Monitor Anesthesia Care | Site: Eye | Laterality: Right | Wound class: Clean

## 2011-10-19 MED ORDER — PROVISC 10 MG/ML IO SOLN
INTRAOCULAR | Status: DC | PRN
  Administered 2011-10-19: 8.5 mg via INTRAOCULAR

## 2011-10-19 MED ORDER — FLURBIPROFEN SODIUM 0.03 % OP SOLN
1.0000 [drp] | OPHTHALMIC | Status: AC
  Administered 2011-10-19 (×3): 1 [drp] via OPHTHALMIC

## 2011-10-19 MED ORDER — LACTATED RINGERS IV SOLN
INTRAVENOUS | Status: DC | PRN
  Administered 2011-10-19: 07:00:00 via INTRAVENOUS

## 2011-10-19 MED ORDER — TETRACAINE HCL 0.5 % OP SOLN
1.0000 [drp] | OPHTHALMIC | Status: AC
  Administered 2011-10-19 (×3): 1 [drp] via OPHTHALMIC

## 2011-10-19 MED ORDER — EPINEPHRINE HCL 1 MG/ML IJ SOLN
INTRAMUSCULAR | Status: AC
  Filled 2011-10-19: qty 1

## 2011-10-19 MED ORDER — LACTATED RINGERS IV SOLN
INTRAVENOUS | Status: DC
  Administered 2011-10-19: 1000 mL via INTRAVENOUS

## 2011-10-19 MED ORDER — PHENYLEPHRINE HCL 2.5 % OP SOLN
1.0000 [drp] | OPHTHALMIC | Status: AC
  Administered 2011-10-19 (×3): 1 [drp] via OPHTHALMIC

## 2011-10-19 MED ORDER — EPINEPHRINE HCL 1 MG/ML IJ SOLN
INTRAOCULAR | Status: DC | PRN
  Administered 2011-10-19: 08:00:00

## 2011-10-19 MED ORDER — PHENYLEPHRINE HCL 2.5 % OP SOLN
OPHTHALMIC | Status: AC
  Administered 2011-10-19: 1 [drp] via OPHTHALMIC
  Filled 2011-10-19: qty 2

## 2011-10-19 MED ORDER — TETRACAINE 0.5 % OP SOLN OPTIME - NO CHARGE
OPHTHALMIC | Status: DC | PRN
  Administered 2011-10-19: 1 [drp] via OPHTHALMIC

## 2011-10-19 MED ORDER — BSS IO SOLN
INTRAOCULAR | Status: DC | PRN
  Administered 2011-10-19: 15 mL via INTRAOCULAR

## 2011-10-19 MED ORDER — GLYCOPYRROLATE 0.2 MG/ML IJ SOLN
INTRAMUSCULAR | Status: DC | PRN
  Administered 2011-10-19: 0.2 mg via INTRAVENOUS

## 2011-10-19 MED ORDER — MIDAZOLAM HCL 2 MG/2ML IJ SOLN
1.0000 mg | INTRAMUSCULAR | Status: DC | PRN
  Administered 2011-10-19: 2 mg via INTRAVENOUS

## 2011-10-19 MED ORDER — MIDAZOLAM HCL 2 MG/2ML IJ SOLN
INTRAMUSCULAR | Status: AC
  Filled 2011-10-19: qty 2

## 2011-10-19 MED ORDER — GLYCOPYRROLATE 0.2 MG/ML IJ SOLN
INTRAMUSCULAR | Status: AC
  Filled 2011-10-19: qty 1

## 2011-10-19 MED ORDER — CYCLOPENTOLATE-PHENYLEPHRINE 0.2-1 % OP SOLN
1.0000 [drp] | OPHTHALMIC | Status: AC
  Administered 2011-10-19 (×3): 1 [drp] via OPHTHALMIC

## 2011-10-19 SURGICAL SUPPLY — 23 items
CAPSULAR TENSION RING-AMO (OPHTHALMIC RELATED) IMPLANT
CLOTH BEACON ORANGE TIMEOUT ST (SAFETY) ×2 IMPLANT
EYE SHIELD UNIVERSAL CLEAR (GAUZE/BANDAGES/DRESSINGS) ×2 IMPLANT
GLOVE BIOGEL M 6.5 STRL (GLOVE) ×2 IMPLANT
GLOVE ECLIPSE 6.5 STRL STRAW (GLOVE) IMPLANT
GLOVE ECLIPSE 7.0 STRL STRAW (GLOVE) IMPLANT
GLOVE EXAM NITRILE LRG STRL (GLOVE) IMPLANT
GLOVE EXAM NITRILE MD LF STRL (GLOVE) ×2 IMPLANT
GLOVE SKINSENSE NS SZ6.5 (GLOVE)
GLOVE SKINSENSE STRL SZ6.5 (GLOVE) IMPLANT
HEALON 5 0.6 ML (INTRAOCULAR LENS) IMPLANT
KIT VITRECTOMY (OPHTHALMIC RELATED) IMPLANT
PAD ARMBOARD 7.5X6 YLW CONV (MISCELLANEOUS) ×2 IMPLANT
PROC W NO LENS (INTRAOCULAR LENS)
PROC W SPEC LENS (INTRAOCULAR LENS)
PROCESS W NO LENS (INTRAOCULAR LENS) IMPLANT
PROCESS W SPEC LENS (INTRAOCULAR LENS) IMPLANT
RING MALYGIN (MISCELLANEOUS) IMPLANT
SIGHTPATH CAT PROC W REG LENS (Ophthalmic Related) ×2 IMPLANT
TAPE SURG TRANSPORE 1 IN (GAUZE/BANDAGES/DRESSINGS) ×1 IMPLANT
TAPE SURGICAL TRANSPORE 1 IN (GAUZE/BANDAGES/DRESSINGS) ×1
VISCOELASTIC ADDITIONAL (OPHTHALMIC RELATED) IMPLANT
WATER STERILE IRR 250ML POUR (IV SOLUTION) ×2 IMPLANT

## 2011-10-19 NOTE — H&P (Signed)
The patient was re examined and there is no change in the patients condition since the original H and P. 

## 2011-10-19 NOTE — Op Note (Signed)
Patient brought to the operating room and prepped and draped in the usual manner.  Lid speculum inserted in right eye.  Stab incision made at the twelve o'clock position.  Provisc instilled in the anterior chamber.   A 2.4 mm. Stab incision was made temporally.  An anterior capsulotomy was done with a bent 25 gauge needle.  The nucleus was hydrodissected.  The Phaco tip was inserted in the anterior chamber and the nucleus was emulsified.  CDE was 10.81.  The cortical material was then removed with the I and A tip.  Posterior capsule was the polished.  The anterior chamber was deepened with Provisc.  A 18.0 Diopter Rayner 570C IOL was then inserted in the capsular bag.  Provisc was then removed with the I and A tip.  The wound was then hydrated.  Patient sent to the Recovery Room in good condition with follow up in my office.

## 2011-10-19 NOTE — Transfer of Care (Signed)
Immediate Anesthesia Transfer of Care Note  Patient: Kristin [name]  Procedure(s) Performed: Procedure(s) (LRB): CATARACT EXTRACTION PHACO AND INTRAOCULAR LENS PLACEMENT (IOC) (Right)  Patient Location: Short Stay  Anesthesia Type: MAC  Level of Consciousness: awake, alert , oriented and patient cooperative  Airway & Oxygen Therapy: Patient Spontanous Breathing  Post-op Assessment: Report given to PACU RN and Post -op Vital signs reviewed and stable  Post vital signs: Reviewed and stable  Complications: No apparent anesthesia complications

## 2011-10-19 NOTE — Anesthesia Preprocedure Evaluation (Signed)
Anesthesia Evaluation  Patient identified by MRN, date of birth, ID band Patient awake    Reviewed: Allergy & Precautions, H&P , NPO status , Patient's Chart, lab work & pertinent test results, reviewed documented beta blocker date and time   History of Anesthesia Complications Negative for: history of anesthetic complications  Airway Mallampati: I      Dental  (+) Edentulous Upper and Edentulous Lower   Pulmonary asthma , COPD oxygen dependent,  clear to auscultation        Cardiovascular hypertension, + CAD Regular Bradycardia    Neuro/Psych    GI/Hepatic   Endo/Other    Renal/GU      Musculoskeletal  (+) Arthritis -, Osteoarthritis,    Abdominal   Peds  Hematology   Anesthesia Other Findings   Reproductive/Obstetrics                           Anesthesia Physical Anesthesia Plan  ASA: III  Anesthesia Plan: MAC   Post-op Pain Management:    Induction: Intravenous  Airway Management Planned: Nasal Cannula  Additional Equipment:   Intra-op Plan:   Post-operative Plan:   Informed Consent: I have reviewed the patients History and Physical, chart, labs and discussed the procedure including the risks, benefits and alternatives for the proposed anesthesia with the patient or authorized representative who has indicated his/her understanding and acceptance.     Plan Discussed with:   Anesthesia Plan Comments:         Anesthesia Quick Evaluation

## 2011-10-19 NOTE — Discharge Instructions (Signed)
Kristin [name]  10/19/2011           [name] [id number] [address]      1. Avoid closing eyes tightly. One often closes the eye tightly when laughing, talking, sneezing, coughing or if they feel irritated. At these times, you should be careful not to close your eyes tightly.  2. Instill one drop [place] in operative eye 3 times each day until the bottle is finished. To instill drops in your eye, open it, look up and have someone gently pull the lower lid down and instill a couple of drops inside the lower lid.  3. Do not touch upper lid.  4. Take Advil or Tylenol for pain.  5. You may use either eye for near work, such as reading or sewing and you may watch television.  6. You may have your hair done at the beauty parlor at any time.  7. Wear dark glasses with or without your own glasses if you are in bright light.  8. Call our office at [phone] or [phone] if you have sharp pain in your eye or unusual symptoms.  9. Do not be concerned because vision in the operative eye is not good. It will not be good, no matter how successful the operation, until you get a special lens for it. Your old glasses will not be suited to the new eye that was operated on and you will not be ready for a new lens for about a month.  10. Follow up at the [facility] office.    I have received a copy of the above instructions and will follow them.

## 2011-10-19 NOTE — Anesthesia Postprocedure Evaluation (Signed)
  Anesthesia Post-op Note  Patient: Kristin [name]  Procedure(s) Performed: Procedure(s) (LRB): CATARACT EXTRACTION PHACO AND INTRAOCULAR LENS PLACEMENT (IOC) (Right)  Patient Location: Short Stay  Anesthesia Type: MAC  Level of Consciousness: awake, alert , oriented and patient cooperative  Airway and Oxygen Therapy: Patient Spontanous Breathing  Post-op Pain: none  Post-op Assessment: Post-op Vital signs reviewed, Patient's Cardiovascular Status Stable and Respiratory Function Stable  Post-op Vital Signs: Reviewed and stable  Complications: No apparent anesthesia complications

## 2011-10-19 NOTE — Preoperative (Signed)
Beta Blockers   Reason not to administer Beta Blockers:Not Applicable 

## 2011-10-21 ENCOUNTER — Encounter (HOSPITAL_COMMUNITY): Payer: Self-pay | Admitting: Ophthalmology

## 2011-10-26 ENCOUNTER — Encounter (HOSPITAL_COMMUNITY): Payer: Self-pay | Admitting: Pharmacy Technician

## 2011-10-28 ENCOUNTER — Encounter (HOSPITAL_COMMUNITY): Payer: Self-pay

## 2011-10-28 ENCOUNTER — Encounter (HOSPITAL_COMMUNITY)
Admission: RE | Admit: 2011-10-28 | Discharge: 2011-10-28 | Disposition: A | Discharge status: Home / Self Care | Payer: Medicare Other | Source: Ambulatory Visit | Attending: Ophthalmology | Admitting: Ophthalmology

## 2011-11-01 MED ORDER — FLURBIPROFEN SODIUM 0.03 % OP SOLN
OPHTHALMIC | Status: AC
  Filled 2011-11-01: qty 2.5

## 2011-11-01 MED ORDER — CYCLOPENTOLATE-PHENYLEPHRINE 0.2-1 % OP SOLN
OPHTHALMIC | Status: AC
  Filled 2011-11-01: qty 2

## 2011-11-01 MED ORDER — TETRACAINE HCL 0.5 % OP SOLN
OPHTHALMIC | Status: AC
  Filled 2011-11-01: qty 2

## 2011-11-02 ENCOUNTER — Encounter (HOSPITAL_COMMUNITY)
Admission: RE | Disposition: A | Discharge status: Home / Self Care | Payer: Self-pay | Source: Ambulatory Visit | Attending: Ophthalmology

## 2011-11-02 ENCOUNTER — Ambulatory Visit (HOSPITAL_COMMUNITY): Payer: Medicare Other | Admitting: Anesthesiology

## 2011-11-02 ENCOUNTER — Encounter (HOSPITAL_COMMUNITY): Payer: Self-pay | Admitting: *Deleted

## 2011-11-02 ENCOUNTER — Encounter (HOSPITAL_COMMUNITY): Payer: Self-pay | Admitting: Anesthesiology

## 2011-11-02 ENCOUNTER — Ambulatory Visit (HOSPITAL_COMMUNITY)
Admission: RE | Admit: 2011-11-02 | Discharge: 2011-11-02 | Disposition: A | Discharge status: Home / Self Care | Payer: Medicare Other | Source: Ambulatory Visit | Attending: Ophthalmology | Admitting: Ophthalmology

## 2011-11-02 DIAGNOSIS — J4489 Other specified chronic obstructive pulmonary disease: Secondary | ICD-10-CM | POA: Insufficient documentation

## 2011-11-02 DIAGNOSIS — Z79899 Other long term (current) drug therapy: Secondary | ICD-10-CM | POA: Insufficient documentation

## 2011-11-02 DIAGNOSIS — J449 Chronic obstructive pulmonary disease, unspecified: Secondary | ICD-10-CM | POA: Insufficient documentation

## 2011-11-02 DIAGNOSIS — I10 Essential (primary) hypertension: Secondary | ICD-10-CM | POA: Insufficient documentation

## 2011-11-02 DIAGNOSIS — H2510 Age-related nuclear cataract, unspecified eye: Secondary | ICD-10-CM | POA: Insufficient documentation

## 2011-11-02 DIAGNOSIS — Z9981 Dependence on supplemental oxygen: Secondary | ICD-10-CM | POA: Insufficient documentation

## 2011-11-02 HISTORY — PX: CATARACT EXTRACTION W/PHACO: SHX586

## 2011-11-02 HISTORY — DX: Heart failure, unspecified: I50.9

## 2011-11-02 SURGERY — PHACOEMULSIFICATION, CATARACT, WITH IOL INSERTION
Anesthesia: Monitor Anesthesia Care | Site: Eye | Laterality: Left | Wound class: Clean

## 2011-11-02 MED ORDER — FENTANYL CITRATE 0.05 MG/ML IJ SOLN: 25.0000 ug | INTRAMUSCULAR | Status: DC | PRN

## 2011-11-02 MED ORDER — FLURBIPROFEN SODIUM 0.03 % OP SOLN
1.0000 [drp] | OPHTHALMIC | Status: AC
  Administered 2011-11-02 (×3): 1 [drp] via OPHTHALMIC

## 2011-11-02 MED ORDER — GLYCOPYRROLATE 0.2 MG/ML IJ SOLN
INTRAMUSCULAR | Status: AC
  Filled 2011-11-02: qty 1

## 2011-11-02 MED ORDER — ONDANSETRON HCL 4 MG/2ML IJ SOLN: 4.0000 mg | Freq: Once | INTRAMUSCULAR | Status: DC | PRN

## 2011-11-02 MED ORDER — LACTATED RINGERS IV SOLN
INTRAVENOUS | Status: DC | PRN
  Administered 2011-11-02: 08:00:00 via INTRAVENOUS

## 2011-11-02 MED ORDER — PHENYLEPHRINE HCL 2.5 % OP SOLN
OPHTHALMIC | Status: AC
  Filled 2011-11-02: qty 2

## 2011-11-02 MED ORDER — GLYCOPYRROLATE 0.2 MG/ML IJ SOLN
INTRAMUSCULAR | Status: DC | PRN
  Administered 2011-11-02: 0.2 mg via INTRAVENOUS

## 2011-11-02 MED ORDER — BSS IO SOLN
INTRAOCULAR | Status: DC | PRN
  Administered 2011-11-02: 15 mL via INTRAOCULAR

## 2011-11-02 MED ORDER — TETRACAINE HCL 0.5 % OP SOLN
1.0000 [drp] | OPHTHALMIC | Status: AC
  Administered 2011-11-02 (×3): 1 [drp] via OPHTHALMIC

## 2011-11-02 MED ORDER — MIDAZOLAM HCL 2 MG/2ML IJ SOLN
1.0000 mg | INTRAMUSCULAR | Status: DC | PRN
  Administered 2011-11-02: 1 mg via INTRAVENOUS

## 2011-11-02 MED ORDER — CYCLOPENTOLATE-PHENYLEPHRINE 0.2-1 % OP SOLN
1.0000 [drp] | OPHTHALMIC | Status: AC
  Administered 2011-11-02 (×3): 1 [drp] via OPHTHALMIC

## 2011-11-02 MED ORDER — LACTATED RINGERS IV SOLN
INTRAVENOUS | Status: DC
  Administered 2011-11-02: 08:00:00 via INTRAVENOUS

## 2011-11-02 MED ORDER — PHENYLEPHRINE HCL 2.5 % OP SOLN
1.0000 [drp] | OPHTHALMIC | Status: AC
  Administered 2011-11-02 (×3): 1 [drp] via OPHTHALMIC

## 2011-11-02 MED ORDER — MIDAZOLAM HCL 2 MG/2ML IJ SOLN
INTRAMUSCULAR | Status: AC
  Filled 2011-11-02: qty 2

## 2011-11-02 MED ORDER — EPINEPHRINE HCL 1 MG/ML IJ SOLN
INTRAOCULAR | Status: DC | PRN
  Administered 2011-11-02: 08:00:00

## 2011-11-02 MED ORDER — EPINEPHRINE HCL 1 MG/ML IJ SOLN
INTRAMUSCULAR | Status: AC
  Filled 2011-11-02: qty 1

## 2011-11-02 MED ORDER — KETOROLAC TROMETHAMINE 0.5 % OP SOLN: 1.0000 [drp] | OPHTHALMIC | Status: AC

## 2011-11-02 MED ORDER — PROVISC 10 MG/ML IO SOLN
INTRAOCULAR | Status: DC | PRN
  Administered 2011-11-02: 8.5 mg via INTRAOCULAR

## 2011-11-02 SURGICAL SUPPLY — 23 items
CAPSULAR TENSION RING-AMO (OPHTHALMIC RELATED) IMPLANT
CLOTH BEACON ORANGE TIMEOUT ST (SAFETY) ×2 IMPLANT
EYE SHIELD UNIVERSAL CLEAR (GAUZE/BANDAGES/DRESSINGS) ×2 IMPLANT
GLOVE BIOGEL M 6.5 STRL (GLOVE) IMPLANT
GLOVE ECLIPSE 6.5 STRL STRAW (GLOVE) IMPLANT
GLOVE ECLIPSE 7.0 STRL STRAW (GLOVE) IMPLANT
GLOVE EXAM NITRILE LRG STRL (GLOVE) IMPLANT
GLOVE EXAM NITRILE MD LF STRL (GLOVE) ×2 IMPLANT
GLOVE SKINSENSE NS SZ6.5 (GLOVE) ×1
GLOVE SKINSENSE STRL SZ6.5 (GLOVE) ×1 IMPLANT
HEALON 5 0.6 ML (INTRAOCULAR LENS) IMPLANT
KIT VITRECTOMY (OPHTHALMIC RELATED) IMPLANT
PAD ARMBOARD 7.5X6 YLW CONV (MISCELLANEOUS) ×2 IMPLANT
PROC W NO LENS (INTRAOCULAR LENS)
PROC W SPEC LENS (INTRAOCULAR LENS)
PROCESS W NO LENS (INTRAOCULAR LENS) IMPLANT
PROCESS W SPEC LENS (INTRAOCULAR LENS) IMPLANT
RING MALYGIN (MISCELLANEOUS) IMPLANT
SIGHTPATH CAT PROC W REG LENS (Ophthalmic Related) ×2 IMPLANT
TAPE SURG TRANSPORE 1 IN (GAUZE/BANDAGES/DRESSINGS) ×1 IMPLANT
TAPE SURGICAL TRANSPORE 1 IN (GAUZE/BANDAGES/DRESSINGS) ×1
VISCOELASTIC ADDITIONAL (OPHTHALMIC RELATED) IMPLANT
WATER STERILE IRR 250ML POUR (IV SOLUTION) ×2 IMPLANT

## 2011-11-02 NOTE — Anesthesia Procedure Notes (Signed)
Procedure Name: MAC Date/Time: 11/02/2011 8:20 AM Performed by: Antony Contras, Anara Cowman L Pre-anesthesia Checklist: Patient identified, Patient being monitored, Emergency Drugs available, Timeout performed and Suction available Oxygen Delivery Method: Nasal cannula

## 2011-11-02 NOTE — Transfer of Care (Signed)
Immediate Anesthesia Transfer of Care Note  Patient: Kristin [name]  Procedure(s) Performed: Procedure(s) (LRB): CATARACT EXTRACTION PHACO AND INTRAOCULAR LENS PLACEMENT (IOC) (Left)  Patient Location: Short Stay  Anesthesia Type: MAC  Level of Consciousness: awake, alert , oriented and patient cooperative  Airway & Oxygen Therapy: Patient Spontanous Breathing  Post-op Assessment: Report given to PACU RN and Post -op Vital signs reviewed and stable  Post vital signs: Reviewed and stable  Complications: No apparent anesthesia complications

## 2011-11-02 NOTE — H&P (Signed)
The patient was re examined and there is no change in the patients condition since the original H and P. 

## 2011-11-02 NOTE — Discharge Instructions (Signed)
CHAYE SIDES  11/02/2011           [place] Instructions [place] [id number] [address]      1. Avoid closing eyes tightly. One often closes the eye tightly when laughing, talking, sneezing, coughing or if they feel irritated. At these times, you should be careful not to close your eyes tightly.  2. Instill one drop [place] in operative eye 3 times each day until the bottle is finished. To instill drops in your eye, open it, look up and have someone gently pull the lower lid down and instill a couple of drops inside the lower lid.  3. Do not touch upper lid.  4. Take Advil or Tylenol for pain.  5. You may use either eye for near work, such as reading or sewing and you may watch television.  6. You may have your hair done at the beauty parlor at any time.  7. Wear dark glasses with or without your own glasses if you are in bright light.  8. Call our office at [phone] or [phone] if you have sharp pain in your eye or unusual symptoms.  9. Do not be concerned because vision in the operative eye is not good. It will not be good, no matter how successful the operation, until you get a special lens for it. Your old glasses will not be suited to the new eye that was operated on and you will not be ready for a new lens for about a month.  10. Follow up at the [facility] office.    I have received a copy of the above instructions and will follow them.

## 2011-11-02 NOTE — Op Note (Signed)
Patient brought to the operating room and prepped and draped in the usual manner.  Lid speculum inserted in left eye.  Stab incision made at the twelve o'clock position.  Provisc instilled in the anterior chamber.   A 2.4 mm. Stab incision was made temporally.  An anterior capsulotomy was done with a bent 25 gauge needle.  The nucleus was hydrodissected.  The Phaco tip was inserted in the anterior chamber and the nucleus was emulsified.  CDE was 8.60.  The cortical material was then removed with the I and A tip.  Posterior capsule was the polished.  The anterior chamber was deepened with Provisc.  A 18.5 Diopter Rayner 570C IOL was then inserted in the capsular bag.  Provisc was then removed with the I and A tip.  The wound was then hydrated.  Patient sent to the Recovery Room in good condition with follow up in my office.

## 2011-11-02 NOTE — Anesthesia Postprocedure Evaluation (Signed)
  Anesthesia Post-op Note  Patient: Kristin [name]  Procedure(s) Performed: Procedure(s) (LRB): CATARACT EXTRACTION PHACO AND INTRAOCULAR LENS PLACEMENT (IOC) (Left)  Patient Location: Short Stay  Anesthesia Type: MAC  Level of Consciousness: awake, alert , oriented and patient cooperative  Airway and Oxygen Therapy: Patient Spontanous Breathing  Post-op Pain: none  Post-op Assessment: Post-op Vital signs reviewed, Patient's Cardiovascular Status Stable, Respiratory Function Stable, Patent Airway and No signs of Nausea or vomiting  Post-op Vital Signs: Reviewed and stable  Complications: No apparent anesthesia complications

## 2011-11-02 NOTE — Anesthesia Preprocedure Evaluation (Signed)
Anesthesia Evaluation  Patient identified by MRN, date of birth, ID band Patient awake    Reviewed: Allergy & Precautions, H&P , NPO status , Patient's Chart, lab work & pertinent test results, reviewed documented beta blocker date and time   History of Anesthesia Complications Negative for: history of anesthetic complications  Airway Mallampati: I      Dental  (+) Edentulous Upper and Edentulous Lower   Pulmonary asthma , COPD oxygen dependent,  breath sounds clear to auscultation        Cardiovascular hypertension, + CAD Rhythm:Regular Rate:Bradycardia     Neuro/Psych    GI/Hepatic   Endo/Other    Renal/GU      Musculoskeletal  (+) Arthritis -, Osteoarthritis,    Abdominal   Peds  Hematology   Anesthesia Other Findings   Reproductive/Obstetrics                           Anesthesia Physical Anesthesia Plan  ASA: III  Anesthesia Plan: MAC   Post-op Pain Management:    Induction: Intravenous  Airway Management Planned: Nasal Cannula  Additional Equipment:   Intra-op Plan:   Post-operative Plan:   Informed Consent: I have reviewed the patients History and Physical, chart, labs and discussed the procedure including the risks, benefits and alternatives for the proposed anesthesia with the patient or authorized representative who has indicated his/her understanding and acceptance.     Plan Discussed with:   Anesthesia Plan Comments:         Anesthesia Quick Evaluation

## 2011-11-04 ENCOUNTER — Encounter (HOSPITAL_COMMUNITY): Payer: Self-pay | Admitting: Ophthalmology

## 2011-11-23 ENCOUNTER — Ambulatory Visit: Payer: Self-pay | Admitting: Oncology

## 2011-11-23 LAB — CBC CANCER CENTER
Basophil %: 0.5 %
HCT: 37.8 % (ref 35.0–47.0)
Lymphocyte %: 33.1 %
Monocyte #: 0.8 x10 3/mm — ABNORMAL HIGH (ref 0.0–0.7)
Neutrophil #: 4.6 x10 3/mm (ref 1.4–6.5)
Platelet: 203 x10 3/mm (ref 150–440)
RBC: 4.04 10*6/uL (ref 3.80–5.20)
RDW: 15.4 % — ABNORMAL HIGH (ref 11.5–14.5)
WBC: 8.8 x10 3/mm (ref 3.6–11.0)

## 2011-11-23 LAB — COMPREHENSIVE METABOLIC PANEL
Alkaline Phosphatase: 89 U/L (ref 50–136)
Bilirubin,Total: 0.4 mg/dL (ref 0.2–1.0)
Calcium, Total: 9.3 mg/dL (ref 8.5–10.1)
EGFR (Non-African Amer.): 51 — ABNORMAL LOW
Potassium: 5 mmol/L (ref 3.5–5.1)
SGPT (ALT): 17 U/L
Sodium: 143 mmol/L (ref 136–145)
Total Protein: 7.7 g/dL (ref 6.4–8.2)

## 2011-11-24 LAB — CANCER ANTIGEN 27.29: CA 27.29: 39.4 U/mL — ABNORMAL HIGH (ref 0.0–38.6)

## 2011-12-22 ENCOUNTER — Ambulatory Visit: Payer: Self-pay | Admitting: Oncology

## 2012-01-06 ENCOUNTER — Ambulatory Visit: Payer: Self-pay | Admitting: Unknown Physician Specialty

## 2012-01-11 ENCOUNTER — Ambulatory Visit: Payer: Self-pay | Admitting: Pain Medicine

## 2012-01-13 ENCOUNTER — Ambulatory Visit: Payer: Self-pay | Admitting: Pain Medicine

## 2012-01-31 ENCOUNTER — Ambulatory Visit: Payer: Self-pay | Admitting: Unknown Physician Specialty

## 2012-02-08 ENCOUNTER — Ambulatory Visit: Payer: Self-pay | Admitting: Pain Medicine

## 2012-02-10 ENCOUNTER — Ambulatory Visit: Payer: Self-pay | Admitting: Pain Medicine

## 2012-02-29 ENCOUNTER — Ambulatory Visit: Payer: Self-pay | Admitting: Pain Medicine

## 2012-03-07 ENCOUNTER — Ambulatory Visit: Payer: Self-pay | Admitting: Pain Medicine

## 2012-03-13 ENCOUNTER — Ambulatory Visit: Payer: Self-pay | Admitting: Pain Medicine

## 2012-06-29 ENCOUNTER — Ambulatory Visit: Payer: Self-pay | Admitting: Family Medicine

## 2012-07-31 ENCOUNTER — Ambulatory Visit: Payer: Self-pay | Admitting: Pain Medicine

## 2012-12-13 ENCOUNTER — Other Ambulatory Visit: Payer: Self-pay | Admitting: Pain Medicine

## 2012-12-13 ENCOUNTER — Ambulatory Visit: Payer: Self-pay | Admitting: Pain Medicine

## 2012-12-19 ENCOUNTER — Ambulatory Visit: Payer: Self-pay | Admitting: Pain Medicine

## 2013-01-08 ENCOUNTER — Ambulatory Visit: Payer: Self-pay | Admitting: Pain Medicine

## 2013-03-30 ENCOUNTER — Ambulatory Visit: Payer: Self-pay | Admitting: Pain Medicine

## 2013-04-05 ENCOUNTER — Other Ambulatory Visit: Payer: Self-pay | Admitting: Pain Medicine

## 2013-04-05 LAB — MAGNESIUM: Magnesium: 2.2 mg/dL

## 2013-07-03 ENCOUNTER — Ambulatory Visit: Payer: Self-pay | Admitting: Oncology

## 2013-07-10 ENCOUNTER — Ambulatory Visit: Payer: Self-pay | Admitting: Oncology

## 2013-07-10 LAB — CBC CANCER CENTER
Basophil #: 0.1 x10 3/mm (ref 0.0–0.1)
Basophil %: 0.8 %
Eosinophil #: 0.6 x10 3/mm (ref 0.0–0.7)
Eosinophil %: 8.1 %
HCT: 38.2 % (ref 35.0–47.0)
HGB: 12.4 g/dL (ref 12.0–16.0)
Lymphocyte #: 2.7 x10 3/mm (ref 1.0–3.6)
Lymphocyte %: 39.5 %
MCH: 30 pg (ref 26.0–34.0)
MCHC: 32.6 g/dL (ref 32.0–36.0)
MCV: 92 fL (ref 80–100)
Monocyte #: 0.5 x10 3/mm (ref 0.2–0.9)
Monocyte %: 7.8 %
Neutrophil #: 3 x10 3/mm (ref 1.4–6.5)
Neutrophil %: 43.8 %
Platelet: 151 x10 3/mm (ref 150–440)
RBC: 4.15 10*6/uL (ref 3.80–5.20)
RDW: 14.8 % — ABNORMAL HIGH (ref 11.5–14.5)
WBC: 6.8 x10 3/mm (ref 3.6–11.0)

## 2013-07-10 LAB — COMPREHENSIVE METABOLIC PANEL
Albumin: 3.3 g/dL — ABNORMAL LOW (ref 3.4–5.0)
Alkaline Phosphatase: 99 U/L (ref 50–136)
Anion Gap: 6 — ABNORMAL LOW (ref 7–16)
BUN: 24 mg/dL — ABNORMAL HIGH (ref 7–18)
Bilirubin,Total: 0.4 mg/dL (ref 0.2–1.0)
Calcium, Total: 9.4 mg/dL (ref 8.5–10.1)
Chloride: 106 mmol/L (ref 98–107)
Co2: 27 mmol/L (ref 21–32)
Creatinine: 1.73 mg/dL — ABNORMAL HIGH (ref 0.60–1.30)
EGFR (African American): 32 — ABNORMAL LOW
EGFR (Non-African Amer.): 27 — ABNORMAL LOW
Glucose: 90 mg/dL (ref 65–99)
Osmolality: 281 (ref 275–301)
Potassium: 4.7 mmol/L (ref 3.5–5.1)
SGOT(AST): 19 U/L (ref 15–37)
SGPT (ALT): 15 U/L (ref 12–78)
Sodium: 139 mmol/L (ref 136–145)
Total Protein: 7.4 g/dL (ref 6.4–8.2)

## 2013-07-11 LAB — CANCER ANTIGEN 27.29: CA 27.29: 49.4 U/mL — ABNORMAL HIGH (ref 0.0–38.6)

## 2013-07-23 ENCOUNTER — Ambulatory Visit: Payer: Self-pay | Admitting: Oncology

## 2013-07-27 ENCOUNTER — Ambulatory Visit: Payer: Self-pay | Admitting: Pain Medicine

## 2013-10-01 ENCOUNTER — Ambulatory Visit: Payer: Self-pay | Admitting: Pain Medicine

## 2013-10-30 DIAGNOSIS — N183 Chronic kidney disease, stage 3 unspecified: Secondary | ICD-10-CM | POA: Insufficient documentation

## 2013-10-30 DIAGNOSIS — M109 Gout, unspecified: Secondary | ICD-10-CM | POA: Insufficient documentation

## 2013-12-07 ENCOUNTER — Ambulatory Visit: Payer: Self-pay | Admitting: Pain Medicine

## 2014-01-16 ENCOUNTER — Other Ambulatory Visit: Payer: Self-pay | Admitting: Pain Medicine

## 2014-01-16 LAB — BASIC METABOLIC PANEL
ANION GAP: 7 (ref 7–16)
BUN: 31 mg/dL — ABNORMAL HIGH (ref 7–18)
CHLORIDE: 109 mmol/L — AB (ref 98–107)
CO2: 23 mmol/L (ref 21–32)
Calcium, Total: 9.4 mg/dL (ref 8.5–10.1)
Creatinine: 1.55 mg/dL — ABNORMAL HIGH (ref 0.60–1.30)
EGFR (Non-African Amer.): 31 — ABNORMAL LOW
GFR CALC AF AMER: 36 — AB
GLUCOSE: 78 mg/dL (ref 65–99)
OSMOLALITY: 283 (ref 275–301)
POTASSIUM: 5.1 mmol/L (ref 3.5–5.1)
SODIUM: 139 mmol/L (ref 136–145)

## 2014-01-16 LAB — HEPATIC FUNCTION PANEL A (ARMC)
ALBUMIN: 3.7 g/dL (ref 3.4–5.0)
ALT: 14 U/L (ref 12–78)
Alkaline Phosphatase: 103 U/L
BILIRUBIN TOTAL: 0.4 mg/dL (ref 0.2–1.0)
SGOT(AST): 16 U/L (ref 15–37)
Total Protein: 7.9 g/dL (ref 6.4–8.2)

## 2014-01-16 LAB — MAGNESIUM: Magnesium: 2 mg/dL

## 2014-03-01 ENCOUNTER — Ambulatory Visit: Payer: Self-pay | Admitting: Pain Medicine

## 2014-04-03 DIAGNOSIS — N183 Chronic kidney disease, stage 3 unspecified: Secondary | ICD-10-CM | POA: Insufficient documentation

## 2014-04-03 DIAGNOSIS — M5137 Other intervertebral disc degeneration, lumbosacral region: Secondary | ICD-10-CM | POA: Insufficient documentation

## 2014-04-03 DIAGNOSIS — I10 Essential (primary) hypertension: Secondary | ICD-10-CM | POA: Insufficient documentation

## 2014-04-03 DIAGNOSIS — I499 Cardiac arrhythmia, unspecified: Secondary | ICD-10-CM | POA: Insufficient documentation

## 2014-04-03 DIAGNOSIS — M75100 Unspecified rotator cuff tear or rupture of unspecified shoulder, not specified as traumatic: Secondary | ICD-10-CM | POA: Insufficient documentation

## 2014-04-03 DIAGNOSIS — I129 Hypertensive chronic kidney disease with stage 1 through stage 4 chronic kidney disease, or unspecified chronic kidney disease: Secondary | ICD-10-CM | POA: Insufficient documentation

## 2014-04-03 DIAGNOSIS — M48061 Spinal stenosis, lumbar region without neurogenic claudication: Secondary | ICD-10-CM | POA: Insufficient documentation

## 2014-04-03 HISTORY — DX: Cardiac arrhythmia, unspecified: I49.9

## 2014-04-04 DIAGNOSIS — Z9889 Other specified postprocedural states: Secondary | ICD-10-CM | POA: Insufficient documentation

## 2014-04-09 ENCOUNTER — Ambulatory Visit: Payer: Self-pay | Admitting: Pain Medicine

## 2014-04-23 ENCOUNTER — Ambulatory Visit: Payer: Self-pay | Admitting: Pain Medicine

## 2014-06-05 ENCOUNTER — Ambulatory Visit: Payer: Self-pay | Admitting: Pain Medicine

## 2014-07-04 ENCOUNTER — Ambulatory Visit: Payer: Self-pay | Admitting: Internal Medicine

## 2014-07-04 ENCOUNTER — Ambulatory Visit: Payer: Self-pay | Admitting: Oncology

## 2014-07-04 LAB — BASIC METABOLIC PANEL
Anion Gap: 8 (ref 7–16)
BUN: 25 mg/dL — AB (ref 7–18)
CHLORIDE: 110 mmol/L — AB (ref 98–107)
Calcium, Total: 9.6 mg/dL (ref 8.5–10.1)
Co2: 23 mmol/L (ref 21–32)
Creatinine: 1.56 mg/dL — ABNORMAL HIGH (ref 0.60–1.30)
GFR CALC AF AMER: 41 — AB
GFR CALC NON AF AMER: 34 — AB
Glucose: 95 mg/dL (ref 65–99)
Osmolality: 285 (ref 275–301)
Potassium: 4.3 mmol/L (ref 3.5–5.1)
SODIUM: 141 mmol/L (ref 136–145)

## 2014-07-10 ENCOUNTER — Ambulatory Visit: Payer: Self-pay | Admitting: Oncology

## 2014-07-10 LAB — CBC CANCER CENTER
Basophil #: 0.1 x10 3/mm (ref 0.0–0.1)
Basophil %: 1 %
Eosinophil #: 0.6 x10 3/mm (ref 0.0–0.7)
Eosinophil %: 10.6 %
HCT: 38.1 % (ref 35.0–47.0)
HGB: 12 g/dL (ref 12.0–16.0)
LYMPHS PCT: 30.9 %
Lymphocyte #: 1.9 x10 3/mm (ref 1.0–3.6)
MCH: 29.4 pg (ref 26.0–34.0)
MCHC: 31.4 g/dL — ABNORMAL LOW (ref 32.0–36.0)
MCV: 94 fL (ref 80–100)
Monocyte #: 0.5 x10 3/mm (ref 0.2–0.9)
Monocyte %: 8.8 %
NEUTROS ABS: 3 x10 3/mm (ref 1.4–6.5)
Neutrophil %: 48.7 %
PLATELETS: 225 x10 3/mm (ref 150–440)
RBC: 4.07 10*6/uL (ref 3.80–5.20)
RDW: 15.6 % — ABNORMAL HIGH (ref 11.5–14.5)
WBC: 6.1 x10 3/mm (ref 3.6–11.0)

## 2014-07-10 LAB — COMPREHENSIVE METABOLIC PANEL
ALBUMIN: 3.2 g/dL — AB (ref 3.4–5.0)
ANION GAP: 3 — AB (ref 7–16)
Alkaline Phosphatase: 69 U/L
BILIRUBIN TOTAL: 0.4 mg/dL (ref 0.2–1.0)
BUN: 24 mg/dL — AB (ref 7–18)
CHLORIDE: 108 mmol/L — AB (ref 98–107)
CO2: 29 mmol/L (ref 21–32)
CREATININE: 1.54 mg/dL — AB (ref 0.60–1.30)
Calcium, Total: 9.8 mg/dL (ref 8.5–10.1)
GFR CALC AF AMER: 42 — AB
GFR CALC NON AF AMER: 34 — AB
GLUCOSE: 83 mg/dL (ref 65–99)
OSMOLALITY: 283 (ref 275–301)
Potassium: 4.8 mmol/L (ref 3.5–5.1)
SGOT(AST): 26 U/L (ref 15–37)
SGPT (ALT): 15 U/L
Sodium: 140 mmol/L (ref 136–145)
TOTAL PROTEIN: 7.1 g/dL (ref 6.4–8.2)

## 2014-07-23 ENCOUNTER — Ambulatory Visit: Payer: Self-pay | Admitting: Oncology

## 2014-08-27 DIAGNOSIS — J441 Chronic obstructive pulmonary disease with (acute) exacerbation: Secondary | ICD-10-CM | POA: Diagnosis not present

## 2014-08-27 DIAGNOSIS — R05 Cough: Secondary | ICD-10-CM | POA: Diagnosis not present

## 2014-08-29 DIAGNOSIS — I2510 Atherosclerotic heart disease of native coronary artery without angina pectoris: Secondary | ICD-10-CM | POA: Diagnosis not present

## 2014-08-29 DIAGNOSIS — I440 Atrioventricular block, first degree: Secondary | ICD-10-CM | POA: Diagnosis not present

## 2014-08-29 DIAGNOSIS — I129 Hypertensive chronic kidney disease with stage 1 through stage 4 chronic kidney disease, or unspecified chronic kidney disease: Secondary | ICD-10-CM | POA: Diagnosis not present

## 2014-08-29 DIAGNOSIS — E119 Type 2 diabetes mellitus without complications: Secondary | ICD-10-CM | POA: Diagnosis not present

## 2014-08-29 DIAGNOSIS — R05 Cough: Secondary | ICD-10-CM | POA: Diagnosis not present

## 2014-08-29 DIAGNOSIS — Z79899 Other long term (current) drug therapy: Secondary | ICD-10-CM | POA: Diagnosis not present

## 2014-08-29 DIAGNOSIS — I452 Bifascicular block: Secondary | ICD-10-CM | POA: Diagnosis not present

## 2014-08-29 DIAGNOSIS — N189 Chronic kidney disease, unspecified: Secondary | ICD-10-CM | POA: Diagnosis not present

## 2014-08-29 DIAGNOSIS — J449 Chronic obstructive pulmonary disease, unspecified: Secondary | ICD-10-CM | POA: Diagnosis not present

## 2014-08-29 DIAGNOSIS — R918 Other nonspecific abnormal finding of lung field: Secondary | ICD-10-CM | POA: Diagnosis not present

## 2014-08-30 DIAGNOSIS — J449 Chronic obstructive pulmonary disease, unspecified: Secondary | ICD-10-CM | POA: Diagnosis not present

## 2014-08-30 DIAGNOSIS — I129 Hypertensive chronic kidney disease with stage 1 through stage 4 chronic kidney disease, or unspecified chronic kidney disease: Secondary | ICD-10-CM | POA: Diagnosis not present

## 2014-08-30 DIAGNOSIS — R05 Cough: Secondary | ICD-10-CM | POA: Diagnosis not present

## 2014-08-30 DIAGNOSIS — N189 Chronic kidney disease, unspecified: Secondary | ICD-10-CM | POA: Diagnosis not present

## 2014-08-30 DIAGNOSIS — E119 Type 2 diabetes mellitus without complications: Secondary | ICD-10-CM | POA: Diagnosis not present

## 2014-08-30 DIAGNOSIS — I2510 Atherosclerotic heart disease of native coronary artery without angina pectoris: Secondary | ICD-10-CM | POA: Diagnosis not present

## 2014-08-30 DIAGNOSIS — Z79899 Other long term (current) drug therapy: Secondary | ICD-10-CM | POA: Diagnosis not present

## 2014-08-30 DIAGNOSIS — I440 Atrioventricular block, first degree: Secondary | ICD-10-CM | POA: Diagnosis not present

## 2014-08-30 DIAGNOSIS — I452 Bifascicular block: Secondary | ICD-10-CM | POA: Diagnosis not present

## 2014-09-06 DIAGNOSIS — R05 Cough: Secondary | ICD-10-CM | POA: Diagnosis not present

## 2014-09-06 DIAGNOSIS — R001 Bradycardia, unspecified: Secondary | ICD-10-CM | POA: Diagnosis not present

## 2014-09-06 DIAGNOSIS — R55 Syncope and collapse: Secondary | ICD-10-CM | POA: Diagnosis not present

## 2014-09-06 LAB — CBC WITH DIFFERENTIAL/PLATELET
BASOS PCT: 1.1 %
Basophil #: 0.1 10*3/uL (ref 0.0–0.1)
EOS PCT: 4 %
Eosinophil #: 0.4 10*3/uL (ref 0.0–0.7)
HCT: 38.3 % (ref 35.0–47.0)
HGB: 12.2 g/dL (ref 12.0–16.0)
Lymphocyte #: 2.3 10*3/uL (ref 1.0–3.6)
Lymphocyte %: 23.7 %
MCH: 29.3 pg (ref 26.0–34.0)
MCHC: 31.9 g/dL — ABNORMAL LOW (ref 32.0–36.0)
MCV: 92 fL (ref 80–100)
MONO ABS: 0.8 x10 3/mm (ref 0.2–0.9)
MONOS PCT: 7.8 %
NEUTROS PCT: 63.4 %
Neutrophil #: 6.1 10*3/uL (ref 1.4–6.5)
PLATELETS: 229 10*3/uL (ref 150–440)
RBC: 4.17 10*6/uL (ref 3.80–5.20)
RDW: 15.4 % — AB (ref 11.5–14.5)
WBC: 9.6 10*3/uL (ref 3.6–11.0)

## 2014-09-06 LAB — BASIC METABOLIC PANEL
ANION GAP: 9 (ref 7–16)
BUN: 25 mg/dL — AB (ref 7–18)
CALCIUM: 9 mg/dL (ref 8.5–10.1)
CHLORIDE: 106 mmol/L (ref 98–107)
Co2: 23 mmol/L (ref 21–32)
Creatinine: 1.63 mg/dL — ABNORMAL HIGH (ref 0.60–1.30)
GFR CALC AF AMER: 39 — AB
GFR CALC NON AF AMER: 32 — AB
Glucose: 92 mg/dL (ref 65–99)
OSMOLALITY: 280 (ref 275–301)
POTASSIUM: 4.4 mmol/L (ref 3.5–5.1)
Sodium: 138 mmol/L (ref 136–145)

## 2014-09-06 LAB — TROPONIN I: Troponin-I: <0.02 ng/mL

## 2014-09-07 ENCOUNTER — Inpatient Hospital Stay: Payer: Self-pay | Admitting: Internal Medicine

## 2014-09-07 DIAGNOSIS — R001 Bradycardia, unspecified: Secondary | ICD-10-CM | POA: Diagnosis not present

## 2014-09-07 DIAGNOSIS — J449 Chronic obstructive pulmonary disease, unspecified: Secondary | ICD-10-CM | POA: Diagnosis not present

## 2014-09-07 DIAGNOSIS — I10 Essential (primary) hypertension: Secondary | ICD-10-CM | POA: Diagnosis not present

## 2014-09-07 DIAGNOSIS — Z853 Personal history of malignant neoplasm of breast: Secondary | ICD-10-CM | POA: Diagnosis not present

## 2014-09-07 DIAGNOSIS — R69 Illness, unspecified: Secondary | ICD-10-CM | POA: Diagnosis not present

## 2014-09-07 DIAGNOSIS — S0990XA Unspecified injury of head, initial encounter: Secondary | ICD-10-CM | POA: Diagnosis not present

## 2014-09-07 DIAGNOSIS — J9610 Chronic respiratory failure, unspecified whether with hypoxia or hypercapnia: Secondary | ICD-10-CM | POA: Diagnosis not present

## 2014-09-07 DIAGNOSIS — R791 Abnormal coagulation profile: Secondary | ICD-10-CM | POA: Diagnosis not present

## 2014-09-07 DIAGNOSIS — R55 Syncope and collapse: Secondary | ICD-10-CM | POA: Diagnosis not present

## 2014-09-07 DIAGNOSIS — R42 Dizziness and giddiness: Secondary | ICD-10-CM | POA: Diagnosis not present

## 2014-09-07 DIAGNOSIS — N183 Chronic kidney disease, stage 3 (moderate): Secondary | ICD-10-CM | POA: Diagnosis not present

## 2014-09-07 DIAGNOSIS — I951 Orthostatic hypotension: Secondary | ICD-10-CM | POA: Diagnosis not present

## 2014-09-07 DIAGNOSIS — I129 Hypertensive chronic kidney disease with stage 1 through stage 4 chronic kidney disease, or unspecified chronic kidney disease: Secondary | ICD-10-CM | POA: Diagnosis not present

## 2014-09-07 DIAGNOSIS — Z9981 Dependence on supplemental oxygen: Secondary | ICD-10-CM | POA: Diagnosis not present

## 2014-09-07 DIAGNOSIS — S299XXA Unspecified injury of thorax, initial encounter: Secondary | ICD-10-CM | POA: Diagnosis not present

## 2014-09-07 DIAGNOSIS — R05 Cough: Secondary | ICD-10-CM | POA: Diagnosis not present

## 2014-09-07 DIAGNOSIS — I509 Heart failure, unspecified: Secondary | ICD-10-CM | POA: Diagnosis not present

## 2014-09-07 LAB — TROPONIN I
Troponin-I: <0.02 ng/mL
Troponin-I: 0.02 ng/mL

## 2014-09-07 LAB — D-DIMER(ARMC): D-Dimer: 1094 ng/ml

## 2014-09-10 DIAGNOSIS — J45998 Other asthma: Secondary | ICD-10-CM | POA: Diagnosis not present

## 2014-09-16 DIAGNOSIS — J449 Chronic obstructive pulmonary disease, unspecified: Secondary | ICD-10-CM | POA: Insufficient documentation

## 2014-09-16 DIAGNOSIS — I10 Essential (primary) hypertension: Secondary | ICD-10-CM | POA: Diagnosis not present

## 2014-09-16 DIAGNOSIS — Z9889 Other specified postprocedural states: Secondary | ICD-10-CM | POA: Diagnosis not present

## 2014-09-16 DIAGNOSIS — M109 Gout, unspecified: Secondary | ICD-10-CM | POA: Diagnosis not present

## 2014-09-16 DIAGNOSIS — R0602 Shortness of breath: Secondary | ICD-10-CM | POA: Diagnosis not present

## 2014-09-16 DIAGNOSIS — J432 Centrilobular emphysema: Secondary | ICD-10-CM | POA: Insufficient documentation

## 2014-09-16 DIAGNOSIS — I495 Sick sinus syndrome: Secondary | ICD-10-CM | POA: Diagnosis not present

## 2014-09-23 DIAGNOSIS — I491 Atrial premature depolarization: Secondary | ICD-10-CM | POA: Diagnosis not present

## 2014-09-26 DIAGNOSIS — J069 Acute upper respiratory infection, unspecified: Secondary | ICD-10-CM | POA: Diagnosis not present

## 2014-09-26 DIAGNOSIS — I10 Essential (primary) hypertension: Secondary | ICD-10-CM | POA: Diagnosis not present

## 2014-09-26 DIAGNOSIS — J449 Chronic obstructive pulmonary disease, unspecified: Secondary | ICD-10-CM | POA: Diagnosis not present

## 2014-10-01 DIAGNOSIS — M79606 Pain in leg, unspecified: Secondary | ICD-10-CM | POA: Diagnosis not present

## 2014-10-01 DIAGNOSIS — M5416 Radiculopathy, lumbar region: Secondary | ICD-10-CM | POA: Diagnosis not present

## 2014-10-01 DIAGNOSIS — M545 Low back pain: Secondary | ICD-10-CM | POA: Diagnosis not present

## 2014-10-01 DIAGNOSIS — M47816 Spondylosis without myelopathy or radiculopathy, lumbar region: Secondary | ICD-10-CM | POA: Diagnosis not present

## 2014-10-01 DIAGNOSIS — G8929 Other chronic pain: Secondary | ICD-10-CM | POA: Diagnosis not present

## 2014-10-01 DIAGNOSIS — R0602 Shortness of breath: Secondary | ICD-10-CM | POA: Diagnosis not present

## 2014-10-01 DIAGNOSIS — F1120 Opioid dependence, uncomplicated: Secondary | ICD-10-CM | POA: Diagnosis not present

## 2014-10-01 DIAGNOSIS — Z79899 Other long term (current) drug therapy: Secondary | ICD-10-CM | POA: Diagnosis not present

## 2014-10-01 DIAGNOSIS — M25559 Pain in unspecified hip: Secondary | ICD-10-CM | POA: Diagnosis not present

## 2014-10-10 DIAGNOSIS — I495 Sick sinus syndrome: Secondary | ICD-10-CM | POA: Diagnosis not present

## 2014-10-10 DIAGNOSIS — I10 Essential (primary) hypertension: Secondary | ICD-10-CM | POA: Diagnosis not present

## 2014-10-10 DIAGNOSIS — Z9889 Other specified postprocedural states: Secondary | ICD-10-CM | POA: Diagnosis not present

## 2014-10-11 DIAGNOSIS — J45998 Other asthma: Secondary | ICD-10-CM | POA: Diagnosis not present

## 2014-10-18 DIAGNOSIS — N289 Disorder of kidney and ureter, unspecified: Secondary | ICD-10-CM | POA: Diagnosis not present

## 2014-11-09 DIAGNOSIS — J45998 Other asthma: Secondary | ICD-10-CM | POA: Diagnosis not present

## 2014-11-12 DIAGNOSIS — R350 Frequency of micturition: Secondary | ICD-10-CM | POA: Diagnosis not present

## 2014-12-09 DIAGNOSIS — K219 Gastro-esophageal reflux disease without esophagitis: Secondary | ICD-10-CM | POA: Diagnosis not present

## 2014-12-09 DIAGNOSIS — N3281 Overactive bladder: Secondary | ICD-10-CM | POA: Diagnosis not present

## 2014-12-09 DIAGNOSIS — M549 Dorsalgia, unspecified: Secondary | ICD-10-CM | POA: Diagnosis not present

## 2014-12-09 DIAGNOSIS — M109 Gout, unspecified: Secondary | ICD-10-CM | POA: Diagnosis not present

## 2014-12-09 DIAGNOSIS — E780 Pure hypercholesterolemia: Secondary | ICD-10-CM | POA: Diagnosis not present

## 2014-12-09 DIAGNOSIS — I10 Essential (primary) hypertension: Secondary | ICD-10-CM | POA: Diagnosis not present

## 2014-12-09 DIAGNOSIS — G2581 Restless legs syndrome: Secondary | ICD-10-CM | POA: Diagnosis not present

## 2014-12-09 DIAGNOSIS — M1990 Unspecified osteoarthritis, unspecified site: Secondary | ICD-10-CM | POA: Diagnosis not present

## 2014-12-10 DIAGNOSIS — J45998 Other asthma: Secondary | ICD-10-CM | POA: Diagnosis not present

## 2014-12-10 NOTE — Op Note (Signed)
PATIENT NAME:  Kristin [name], Kristin [name] MR#:  [id number] DATE OF BIRTH:  [birth year]  DATE OF PROCEDURE:  03/07/2012  Location: Operating Room Referring Physician: Dr. Albesa Seen Procedure by: Kathlen Brunswick. Dossie Arbour, MD  Note: This is the case of an 81 year old female patient who comes into the clinic today for a bilateral lumbar spinal cord stimulator trial. 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    Procedure(s):  1. Temporary (Trial) implantation of Lumbar Epidural, Double Percutaneous Neurostimulator Leads (16 electrode array). 2. Fluoroscopic Needle Guidance 3. Intraoperative Analysis and Programming. 4. Postoperative Analysis and Programming. 5. Moderate Conscious Sedation  Surgeon: Korinna Tat A. Dossie Arbour, M.D. Side of implant: Bilateral Top electrode tip level: Top of T8 vertebral body. Diagnostic Indications: Chronic Lumbosacral Radiculopathy/Radiculitis, failed back surgery syndrome.  Position: Prone.  Prepping solution: DuraPrep Area prepped: The thoracolumbar and sacral areas, were prepped with a broad-spectrum topical antiseptic microbicide. Target area: Lumbar Epidural Space, around the T8-9 vertebral body, for the electrode tip. Insertion site is the L2-3 intervertebral space. Level entered: L1-L2 Number of attempts: one  Infection Control: Standard Universal Precautions taken (Respiratory Hygiene/Cough Etiquette; Mouth, nose, eye protection; Hand Hygiene; Personal protective equipment (PPE); safe injection practices; and use of masks and disposable sterile surgical gloves) as recommended by the Department of [place] for Disease Control and Prevention (CDC).  Safety Measures: Allergies were reviewed. Appropriate site, procedure, and patient were confirmed by following the Joint Commission's Universal Protocol (UP.01.01.01). The patient was asked to confirm marked site  and procedure, before commencing. The patient was asked about blood thinners, or active infections, both of which were denied. No attempt was made at seeking any paresthesias. Aspiration looking for blood return was conducted prior to injecting. At no point did we inject any substances, as a needle was being advanced.  Pre-procedure Assessment:  A medical history and physical exam were obtained. Relevant documentation was reviewed and verified. Prior to the procedure, the patient was provided with an Audio CD, as well as written information on the procedure, including side-effects, and possible complications. Under the influence of no sedatives, a verbal, as well as a written informed consent were obtained, after having provided information on the risks and possible complications. To fulfill our ethical and legal obligations, as recommended by the American Medical Association's Code of Ethics, we have provided information to the patient about our clinical impression; the nature and purpose of an available treatment or procedure; the risks and benefits of an available treatment or procedure; alternatives; the risk and benefits of the alternative treatment or procedure; and the risks and benefits of not receiving or undergoing a treatment or procedure. The patient was provided information about the risks and possible complications associated with the procedure. These include, but not limited to, failure to achieve desired goals, infection, bleeding, organ or nerve damage, allergic reactions, paralysis, and death. In addition, the patient was informed that Medicine is not an exact science; therefore, there is also the possibility of unforeseen risks and possible complications that may result in a catastrophic outcome. The patient indicated having understood very clearly.  We have given the patient no guarantees and we have made no promises. Ample time was given to the patient to ask questions, all of which were  answered, to the patient's satisfaction, before proceeding. The patient understands that by signing our informed consent form, they understand and accept the risks and the fact  that it is impossible to predict all possible complications. Baseline vital signs were taken and the medical assessment was completed. Verification of the correct person, correct site (including marking of site), and correct procedure were performed and confirmed by the patient. Baseline vital signs were taken and the initial assessment was completed. Verification of the correct person, correct site (including marking of site), and correct procedure were performed and confirmed by the patient, in the form of a "Time Out".  Monitoring: The patient was monitored in the usual manner, using NIBPM, ECG, and pulse oximetry.  IV Access:  An IV access was obtained and secured.  Analgesia:  Moderate (Conscious) Intravenous sedation: Consent was obtained before administering any sedation. Availability of a responsible, adult driver, and NPO status confirmed. Meaningful verbal contact was maintained, with the patient at all times during the procedure. ASA Sedation Guidelines followed. For specifics on pharmacological type and quantity of sedation, please see nursing chart.  Prophylactic Antibiotics: Cefazolin (1st generation cephalosporin) 1 gm IV 30 minutes prior to the procedure.  Local Anesthesia: Lidocaine 1%. The skin over the procedure site were infiltrated using a 3 ml Luer-Lok syringe with a 0.5 inch, 25-G needle. Deeper tissues were infiltrated using a 3.0 inch, 22-G spinal needle, under fluoroscopic guidance.  Fluoroscopy: The patient was taken to the operative suite, where the patient was placed in position for the procedure, over the fluoroscopy compatible table. Fluoroscopy was manipulated, using "Tunnel Vision Technique", to obtain the best possible view of the target area, on the affected side. Parallax error was corrected  before commencing the procedure. Gabor Racz's "Direction-Depth-Direction" technique was used to introduce the procedural needle under continuous pulsed fluoroscopic guidance. Once the target was reached, antero-posterior and lateral fluoroscopic views were taken to confirm needle placement in two planes. Fluoroscopy time: Please see the patient's chart for details.  Description of the procedure: The procedure site was prepped using a broad-spectrum topical antiseptic. The area was then draped in the usual and standard manner. "Time-out" was performed as per JC Universal Protocol (UP.01.01.01).   The skin and deeper tissues over the procedure site were infiltrated using 1% lidocaine, loaded in a 10 ml Luer-Lok syringe with a 0.5 inch, 25-G needle. The procedural needle was then introduced through the skin and deeper tissues, using Gabor Racz's "Direction-Depth-Direction" technique, under pulsed fluoroscopic guidance. No attempt was made at seeking a paresthesia. The paramidline approach was used to enter the posterior lumbar epidural space at a 30 degree angle, using "Loss-of-resistance Technique" with 3 ml of PF-NaCl (0.9% NSS) + 0.5 ml of air, in a 5 ml glass syringe, using a "loss-of-bounce technique", at the desired level. Correct needle placement was confirmed in the  antero-posterior and lateral fluoroscopic views. The epidural lead was gently introduced under real-time fluoroscopy, constantly assessing for pain or paresthesias, until the tip was observed to be at the target level, on the side ipsilateral to the pain. The placement of the second lead was accomplished in a similar fashion. Once the target was thought to have been reached, antero-posterior and lateral fluoroscopic views were taken to confirm electrode placement in two planes. Placement was tested until a comfortable stimulation pattern was observed over the usual painful area. Once the patient had assured [name] that the stimulation was in the  correct pattern, and distribution, we proceeded to remove the 15-G "Tuohy" epidural needle(s). This was done while observing the electrode tip(s) under real-time fluoroscopy to prevent movement. The leads were then fixed to the skin using  silk 2-0 suture. Benzoin was applied to the area, and 6 (six) 25M, 1/2"X4", reinforced, adhesive Steri Strips were used to further secure the lead into place. A sterile transparent dressing was then used to cover the lead, in order to assess any evidence of infection in the future.  The patient tolerated the entire procedure well. A repeat set of vitals were taken after the procedure and the patient was kept under observation until discharge criteria was met. The patient was provided with discharge instructions, including a section on how to identify potential problems. Should any problems arise concerning this procedure, the patient was given instructions to immediately contact us, without hesitation. The neurostimulator representative and I, both provided the patient with our Business cards containing our contact telephone numbers, and instructed the patient to contact either one of [name], at any time, should there be any problems or questions. In any case, we plan to contact the patient by telephone for a follow-up status report regarding this interventional procedure.  EBL: 5 ml  Complications: No heme; no paresthesias.  Disposition: Return to clinics in 5-7 days for removal of trial electrodes and evaluation of trial.  Additional Comments/Plan: None.  Equipment used:   1. Medtronic accessory kit [id number], lot [id number]. 2. Medtronic lead [id number], lot #[id number] 3. Medtronic lead [phone], lot #[id number]  Disclaimer: Medicine is not an Chief Strategy Officer. The only guarantee in medicine is that nothing is guaranteed. It is important to note that the decision to proceed with this intervention was based on the information collected from the patient. The Data and conclusions were  drawn from the patient's questionnaire, the interview, and the physical examination. Because the information was provided in large part by the patient, it cannot be guaranteed that it has not been purposely or unconsciously manipulated. Every effort has been made to obtain as much relevant data as possible for this evaluation. It is important to note that the conclusions that lead to this procedure are derived in large part from the available data. Always take into account that the treatment will also be dependent on availability of resources and existing treatment guidelines, considered by other Pain Management Practitioners as being common knowledge and practice, at this time. For Medico-Legal purposes, it is also important to point out that variations in procedural techniques and pharmacological choices are the acceptable norm. The indications, contraindications, technique, and results of the above procedure should only be interpreted and judged by a Board-Certified Interventional Pain Specialist with extensive familiarity and expertise in the same exact procedure and technique, doing otherwise would be inappropriate and unethical.  ____________________________ Kathlen Brunswick. Dossie Arbour, MD fan:cms D: 03/07/2012 17:11:47 ET T: 03/07/2012 17:40:57 ET JOB#: [id number]  cc: Trini Christiansen A. Dossie Arbour, MD, <Dictator>  Gaspar Cola MD ELECTRONICALLY SIGNED 03/08/2012 15:32

## 2014-12-22 NOTE — H&P (Signed)
PATIENT NAME:  Kristin [name], CARNEAL MR#:  [id number] DATE OF BIRTH:  09-15-1929  DATE OF ADMISSION:  09/07/2014  REFERRING PHYSICIAN: Valli Glance. Owens Shark, MD  PRIMARY CARE PHYSICIAN: [place] Luan Pulling, MD  CARDIOLOGIST: Isaias Cowman, MD, [facility].   CHIEF COMPLAINT: Almost passed out.   HISTORY OF PRESENT ILLNESS: An 81 year old African-American female with a past medical history of congestive heart failure with unknown ejection fraction, COPD, chronic respiratory failure on 2 L nasal cannula at bedtime, presenting with a near-syncopal episode. She recently had a COPD exacerbation about 1 week ago completed both steroids and azithromycin, and back to her usual state of health. However, when she was in her kitchen today cooking dinner, she felt lightheaded and she almost passed out and went to sit in her chair. After sitting there, her symptoms improved. Denies any frank chest pain, shortness of breath, palpitations, or further symptomatology. Brought to the hospital for further workup and evaluation. Noted to be bradycardic with the lowest heart rate down to about 36.  REVIEW OF SYSTEMS:  CONSTITUTIONAL: Denies fevers, chills. Positive for fatigue, weakness.  EYES: Denies blurred vision, double vision, eye pain.  ENT: Denies tinnitus, ear pain, hearing loss.  RESPIRATORY: Denies cough, wheeze, shortness of breath.  CARDIOVASCULAR: Denies chest pain, palpitations, edema.  GASTROINTESTINAL: Denies nausea, vomiting, diarrhea, or abdominal pain.  GENITOURINARY: Denies dysuria or hematuria.  ENDOCRINE: Denies nocturia or thyroid problems. HEMATOLOGY AND LYMPHATIC: Denies easy bruising or bleeding.  SKIN: Denies rashes or lesions.  MUSCULOSKELETAL: Denies pain in neck, back, shoulder, knees, hips or arthritic symptoms.  NEUROLOGIC: Denies any paralysis, paresthesias.  PSYCHIATRIC: Denies anxiety or depressive symptoms.  Otherwise, full review of systems performed by me is negative.    PAST MEDICAL HISTORY: Hepatitis C; essential hypertension; COPD with chronic respiratory failure on 2 L nasal cannula at bedtime; congestive heart failure, unknown ejection fraction; as well as history of breast cancer currently in remission.   SOCIAL HISTORY: Remote tobacco use. Denies any alcohol or drug use.   FAMILY HISTORY: Denies any known cardiovascular or pulmonary disorders.   ALLERGIES: FLEXERIL AS WELL AS VANCOMYCIN.   HOME MEDICATIONS: Include Percocet 10/325 mg 2 tabs 1 to 3 times daily as needed for pain; allopurinol 100 mg p.o. q. daily; Requip 1 mg p.o. q. daily at bedtime; magnesium oxide 400 mg p.o. b.i.d.; Prilosec 20 mg p.o. q. daily; Vesicare 5 mg p.o. q. daily; calcium 600+ vitamin D 200, 1 tablet p.o. b.i.d.; vitamin D3, 2000 international units p.o. q. daily; aspirin 325 mg p.o. q. daily; Lasix 20 mg p.o. q. daily; lisinopril 10 mg p.o. q. daily.   PHYSICAL EXAMINATION:  VITAL SIGNS: Temperature 97.9, heart rate 76 currently, respirations 15, blood pressure 128/71, saturating 97% on supplemental oxygen 2 L nasal cannula. Weight 81.6 kg, BMI 26.6.  GENERAL: A weak-appearing African American currently in no acute distress.  HEAD: Normocephalic, atraumatic.  EYES: Pupils equal, round, reactive to light. Extraocular muscles intact. No scleral icterus.  MOUTH: Moist mucosal membranes. Dentition intact. No abscess noted. EARS, NOSE, AND THROAT: Clear without exudates. No external lesions.  NECK: Supple. No thyromegaly. No nodules. No JVD.  PULMONARY: Bibasilar coarse rhonchi. No use of accessory muscles. Good respiratory effort.  CHEST: Nontender to palpation.  CARDIOVASCULAR: S1, S2, regular rate and rhythm. No murmurs, rubs, or gallops. No edema. Pedal pulses 2+ bilaterally.  GASTROINTESTINAL: Soft, nontender, nondistended. No masses. Positive bowel sounds. No hepatosplenomegaly.  MUSCULOSKELETAL: No swelling, clubbing, or edema. Range of motion full  in all extremities.   NEUROLOGIC: Cranial nerves II through XII intact. No gross focal neurological deficits. Sensation intact. Reflexes intact.  SKIN: No ulceration, lesions, rashes, or cyanosis. Skin warm, dry. Turgor intact. PSYCHIATRIC: Mood and affect within normal limits. The patient is awake, alert, oriented x 3. Insight and judgment intact.  LABORATORY DATA: Includes EKG performed which reveals right bundle branch block, sinus bradycardia, heart rate of 52, lateral T inversions without any old EKGs for comparison. Radiographic imaging: Had a CT, head, performed which reveals no acute intracranial process. Remainder of laboratory data: Sodium of 138, potassium 4.4, chloride 106, bicarbonate of 23, BUN 25, creatinine 1.63, glucose 92. Troponin less than 0.02. WBC 9.6, hemoglobin 12.2, platelets of 229,000. D-dimer of 1094.   ASSESSMENT AND PLAN: An 81 year old African American female with a history of congestive heart failure, unknown ejection fraction; chronic obstructive pulmonary disease with chronic respiratory failure on 2 L nasal cannula at bedtime presenting with a near-syncopal episode.  1.  Near syncope: Likely secondary to symptomatic bradycardia. Heart rate dropped into the 30s while present in the Emergency Department. Place on telemetry. Consult cardiology. She is on no atrioventricular nodal agents. She may require permanent pacemaker if bradycardia remains an issue. Will also provide gentle intravenous fluid hydration given she has an element of acute kidney injury.  2.  Elevated D-dimer: Will get a CT scan to rule out a pulmonary embolism. Follow up on that study.  3.  Chronic obstructive pulmonary disease: Provide supplemental oxygen at bedtime as well as breathing treatments as needed.  4.  Hypertension: Lisinopril.  5.  Venous thromboembolism prophylaxis: Heparin subcutaneous.  CODE STATUS: The patient is full code.   TIME SPENT: 45 minutes.    ____________________________ Aaron Mose. Dantonio Justen,  MD dkh:ST D: 09/07/2014 02:07:52 ET T: 09/07/2014 02:20:35 ET JOB#: [id number]  cc: Aaron Mose. Kham Zuckerman, MD, <Dictator> Evyn Putzier Woodfin Ganja MD ELECTRONICALLY SIGNED 09/07/2014 4:03

## 2014-12-22 NOTE — Consult Note (Signed)
PATIENT NAME:  Kristin [name], Kristin [name] MR#:  [id number] DATE OF BIRTH:  1929-11-22  DATE OF CONSULTATION:  09/07/2014  REFERRING PHYSICIAN:   CONSULTING PHYSICIAN:  Corey Skains, MD  REFERRING PHYSICIAN: Dr. Posey Pronto   REASON FOR CONSULTATION: Near-syncope with bradycardia, chronic obstructive pulmonary disease, chronic kidney disease, and hypertension.   CHIEF COMPLAINT: "I almost passed out."   HISTORY OF PRESENT ILLNESS: This is an 81 year old female with known chronic kidney disease stage III, essential hypertension, chronic obstructive pulmonary disease on oxygen, having a near syncopal episode with dizziness, weakness occurring while she was at home at rest. This occurred with no evidence of chest discomfort or worsening shortness of breath. The patient did not completely pass out and did have some recovery. When she was seen in the Emergency Room, she had an EKG showing sinus bradycardia with left axis deviation and right bundle branch block with lateral T wave inversions. She does have normal troponin with no evidence of telemetry changes of heart block, but continues to have bradycardia. She has not been using any medications that could cause her bradycardia.   REVIEW OF SYSTEMS: The remainder review of systems negative for vision change, ringing in the ears, hearing loss, cough, congestion, heartburn, nausea, vomiting, diarrhea, bloody stools, stomach pain, extremity pain, leg weakness, cramping of the buttocks, known blood clots, headache, nosebleeds, congestion, trouble swallowing, frequent urination, urination at night, muscle weakness, numbness, anxiety, depression, skin lesions, and skin rashes.   PAST MEDICAL HISTORY:  1. Chronic kidney disease.  2. Hypertension.  3. COPD.   FAMILY HISTORY: Multiple family members with early onset of cardiovascular disease and hypertension.   SOCIAL HISTORY: Currently denies alcohol or tobacco use.   ALLERGIES: As listed.   MEDICATIONS: As listed.    PHYSICAL EXAMINATION:  VITAL SIGNS: Blood pressure is 110/68 bilaterally, heart rate is 52 upright, reclining, and slightly irregular.  GENERAL: She is a well-appearing female in no acute distress.  HEENT: No icterus, thyromegaly, ulcers, hemorrhage, or xanthelasma.  CARDIOVASCULAR: Regular rate and rhythm at 52 beats per minute with normal S1, S2. No apparent murmur, gallop, or rub. PMI is inferiorly displaced. Carotid upstroke normal without bruit. Jugular venous pressure is normal.  LUNGS: Clear to auscultation with normal respirations.  ABDOMEN: Soft, nontender, without hepatosplenomegaly or masses. Abdominal aorta is normal size without bruit.  EXTREMITIES: 2+ bilateral pulses in dorsal, pedal, radial, and femoral arteries without lower extremity edema, cyanosis, clubbing, or ulcers.  NEUROLOGIC: She is oriented to time, place, and person, with normal mood and affect.   ASSESSMENT: An 81 year old female with chronic kidney disease stage III, essential hypertension, and chronic obstructive pulmonary disease with abnormal EKG and near syncope with bradycardia, but no current evidence of heart block or myocardial infarction.   RECOMMENDATIONS: 1. Continue telemetry with ambulation, following for any further significant heart block needing pacemaker.  2. Will consideration of treadmill EKG for chronotropic incompetence as a cause needing pacemaker.  3. Echocardiogram for LV systolic dysfunction, valvular heart disease contributing to above.  4. Continue essential hypertension control, but avoid beta blockers  5. Two liters of oxygen for chronic obstructive pulmonary disease.  6. Continue to watch chronic kidney disease and/or anemia causing above.   Further diagnostic testing and treatment options after above.    ____________________________ Corey Skains, MD bjk:mw D: 09/07/2014 08:43:07 ET T: 09/07/2014 11:04:46 ET JOB#: [id number]  cc: Corey Skains, MD, <Dictator>  Corey Skains MD ELECTRONICALLY SIGNED 09/09/2014 13:27

## 2014-12-22 NOTE — Discharge Summary (Signed)
PATIENT NAME:  Kristin [name], Kristin [name] MR#:  [id number] DATE OF BIRTH:  [birth year]  DATE OF ADMISSION:  09/07/2014 DATE OF DISCHARGE:  09/07/2014  For a detailed note, please see the history and physical done on admission by Dr. Valentino Nose.   DIAGNOSES AT DISCHARGE:  1.  Presyncope, possibly related to orthostatic hypotension.  2.  History of hypertension.  3.  Gastroesophageal reflux disease.  4.  History of urinary incontinence.  5.  Restless leg syndrome.  6.  Sinus bradycardia.   DIET: The patient is being discharged on a low-sodium diet.   ACTIVITY: As tolerated.   FOLLOWUP: With Dr. Larene Beach in the next 1 to 2 weeks. Also follow up with Dr. Saralyn Pilar in the next 1 to 2 weeks.   DISCHARGE MEDICATIONS: As follows: Aspirin 325 mg daily, Lasix 20 mg daily, allopurinol 100 mg daily, omeprazole 20 mg daily, magnesium oxide 400 mg b.i.d., vitamin D3 at 2000 international units daily, Requip 1 mg at bedtime, Percocet 10/325 mg 2 tabs t.i.d. as needed, calcium vitamin D 1 tab b.i.d., oxybutynin 5 mg 1/2 tab b.i.d., lisinopril 5 mg daily.   CONSULTANTS DURING THE HOSPITAL COURSE: Dr. Nehemiah Massed from cardiology.   PERTINENT STUDIES DONE DURING HOSPITAL COURSE: A CT of head was done without contrast on admission showing no acute intracranial process. The patient's CT chest done with contrast showing no evidence of acute pulmonary embolism, worsening fibrotic change and honeycombing in the periphery of the lungs with diffuse peribronchial thickening and bronchiectasis. Underlying emphysema noticed. No definite superimposed focal airspace consolidation noted.   HOSPITAL COURSE: This is an 81 year old female who presented to the hospital with a presyncopal episode.   1.  Presyncope: The exact etiology of this is unclear, but suspected to be orthostatic hypotension. There was also some concern whether this was symptomatic bradycardia as the patient's heart did go down to the 30s, but she remained  hemodynamically stable with that low heart rate. The patient has been adequately hydrated with IV fluids now. Clinically, on ambulation, does not feel symptomatically or dizzy. Her cardiac markers x 3 have been negative. Her CT head is negative. At this point, the patient is being discharged home and her blood pressure medications are being adjusted as mentioned with her lisinopril dose being lowered. There is also some concern that this could be chronotropic incompetence given her bradycardia, but this is to be further addressed by cardiology as an outpatient.  2.  Hypertension: The patient was noted to be mildly orthostatic and is currently being discharged on a lower dose of lisinopril.  3.  Restless leg syndrome: She will continue her Requip. 4.  GERD: The patient will continue on Protonix.  5.  Gout: There was no acute gout attack. She will continue allopurinol.  6.  Urinary incontinence: The patient will continue her VESIcare.  7.  Bradycardia: The patient is not on any beta blockers or calcium channel blockers; possibly has underlying sick sinus syndrome with chronotropic incompetence. This is to be further addressed by her cardiologist, Dr. Saralyn Pilar.   CODE STATUS: The patient is a FULL CODE.   DISPOSITION: She is being discharged home.   TIME SPENT: 40 minutes.    ____________________________ Belia Heman. Verdell Carmine, MD vjs:ts D: 09/07/2014 14:08:10 ET T: 09/08/2014 01:10:02 ET JOB#: [id number]  cc: Belia Heman. Verdell Carmine, MD, <Dictator> Isaias Cowman, MD Arlis Porta., MD Henreitta Leber MD ELECTRONICALLY SIGNED 09/17/2014 11:50

## 2014-12-26 DIAGNOSIS — G8929 Other chronic pain: Secondary | ICD-10-CM | POA: Diagnosis not present

## 2014-12-26 DIAGNOSIS — I5032 Chronic diastolic (congestive) heart failure: Secondary | ICD-10-CM | POA: Insufficient documentation

## 2014-12-26 DIAGNOSIS — I10 Essential (primary) hypertension: Secondary | ICD-10-CM | POA: Diagnosis not present

## 2014-12-26 DIAGNOSIS — Z9889 Other specified postprocedural states: Secondary | ICD-10-CM | POA: Diagnosis not present

## 2014-12-26 DIAGNOSIS — Z79899 Other long term (current) drug therapy: Secondary | ICD-10-CM | POA: Diagnosis not present

## 2014-12-26 DIAGNOSIS — M961 Postlaminectomy syndrome, not elsewhere classified: Secondary | ICD-10-CM | POA: Diagnosis not present

## 2014-12-26 DIAGNOSIS — N183 Chronic kidney disease, stage 3 (moderate): Secondary | ICD-10-CM | POA: Diagnosis not present

## 2014-12-26 DIAGNOSIS — M47816 Spondylosis without myelopathy or radiculopathy, lumbar region: Secondary | ICD-10-CM | POA: Diagnosis not present

## 2014-12-26 DIAGNOSIS — I495 Sick sinus syndrome: Secondary | ICD-10-CM | POA: Diagnosis not present

## 2014-12-26 DIAGNOSIS — M545 Low back pain: Secondary | ICD-10-CM | POA: Diagnosis not present

## 2014-12-26 DIAGNOSIS — Z79891 Long term (current) use of opiate analgesic: Secondary | ICD-10-CM | POA: Diagnosis not present

## 2015-01-02 DIAGNOSIS — N183 Chronic kidney disease, stage 3 (moderate): Secondary | ICD-10-CM | POA: Diagnosis not present

## 2015-01-02 DIAGNOSIS — R7302 Impaired glucose tolerance (oral): Secondary | ICD-10-CM | POA: Diagnosis not present

## 2015-01-02 DIAGNOSIS — I129 Hypertensive chronic kidney disease with stage 1 through stage 4 chronic kidney disease, or unspecified chronic kidney disease: Secondary | ICD-10-CM | POA: Diagnosis not present

## 2015-01-02 DIAGNOSIS — M1A9XX0 Chronic gout, unspecified, without tophus (tophi): Secondary | ICD-10-CM | POA: Diagnosis not present

## 2015-01-09 DIAGNOSIS — J45998 Other asthma: Secondary | ICD-10-CM | POA: Diagnosis not present

## 2015-01-16 DIAGNOSIS — I5032 Chronic diastolic (congestive) heart failure: Secondary | ICD-10-CM | POA: Diagnosis not present

## 2015-02-09 DIAGNOSIS — J45998 Other asthma: Secondary | ICD-10-CM | POA: Diagnosis not present

## 2015-03-03 ENCOUNTER — Encounter: Payer: Self-pay | Admitting: Family Medicine

## 2015-03-03 ENCOUNTER — Ambulatory Visit (INDEPENDENT_AMBULATORY_CARE_PROVIDER_SITE_OTHER): Payer: Commercial Managed Care - HMO | Admitting: Family Medicine

## 2015-03-03 VITALS — BP 118/67 | HR 45 | Temp 98.0°F | Resp 16 | Ht 69.0 in | Wt 182.8 lb

## 2015-03-03 DIAGNOSIS — R21 Rash and other nonspecific skin eruption: Secondary | ICD-10-CM

## 2015-03-03 DIAGNOSIS — R600 Localized edema: Secondary | ICD-10-CM | POA: Diagnosis not present

## 2015-03-03 DIAGNOSIS — N319 Neuromuscular dysfunction of bladder, unspecified: Secondary | ICD-10-CM | POA: Diagnosis not present

## 2015-03-03 MED ORDER — FUROSEMIDE 20 MG PO TABS: 20.0000 mg | ORAL_TABLET | Freq: Every day | ORAL | Status: DC

## 2015-03-03 MED ORDER — TRIAMCINOLONE ACETONIDE 0.1 % EX CREA: 1.0000 "application " | TOPICAL_CREAM | Freq: Two times a day (BID) | CUTANEOUS | Status: DC

## 2015-03-03 MED ORDER — OXYBUTYNIN CHLORIDE 5 MG PO TABS: 5.0000 mg | ORAL_TABLET | Freq: Two times a day (BID) | ORAL | Status: DC

## 2015-03-03 NOTE — Progress Notes (Signed)
Name: Kristin [name]   MRN: [id number]    DOB: [birth year]   Date:03/03/2015       Progress Note  Subjective  Chief Complaint  Chief Complaint  Patient presents with  . Foot Swelling    3 weeks  . Rash    lower body- 1 week    HPI  Here c/o bilateral feet and leg swelling.  This has been going on for 3 weeks.She has seen her Card. (Dr. Lillie Fragmin) and he did echo, but told her only to cut down on her salt.  Elevating feet helps the swelling some. She also c/o bumpy, itchy rash on her legs bilaterally.  This has been going on for 2-3 weeks..   Past Medical History  Diagnosis Date  . Hypertension   . Hyperlipidemia   . COPD (chronic obstructive pulmonary disease)     wears O2 at 2L via [place] at night  . Cancer     rt breast cancer-post lumpectomy  . Arthritis   . Chronic back pain   . CHF (congestive heart failure)     History  Substance Use Topics  . Smoking status: Former Smoker -- 1.25 packs/day for 40 years    Types: Cigarettes  . Smokeless tobacco: Not on file  . Alcohol Use: No     Current outpatient prescriptions:  .  albuterol (PROVENTIL) (2.5 MG/3ML) 0.083% nebulizer solution, Take 2.5 mg by nebulization every 8 (eight) hours as needed. Shortness of breath and wheezing, Disp: , Rfl:  .  aspirin 325 MG tablet, Take 325 mg by mouth daily., Disp: , Rfl:  .  calcium citrate-vitamin D (CITRACAL+D) 315-200 MG-UNIT per tablet, Take 1 tablet by mouth 2 (two) times daily., Disp: , Rfl:  .  Cholecalciferol 2000 UNITS TBDP, Take by mouth., Disp: , Rfl:  .  furosemide (LASIX) 20 MG tablet, Take 20 mg by mouth daily., Disp: , Rfl:  .  levocetirizine (XYZAL) 5 MG tablet, Take 5 mg by mouth every evening., Disp: , Rfl:  .  lisinopril (PRINIVIL,ZESTRIL) 10 MG tablet, Take 10 mg by mouth daily., Disp: , Rfl:  .  magnesium hydroxide (MILK OF MAGNESIA) 400 MG/5ML suspension, Take by mouth daily as needed for mild constipation., Disp: , Rfl:  .  Melatonin 5 MG CAPS, Take by mouth.,  Disp: , Rfl:  .  omeprazole (PRILOSEC) 20 MG capsule, Take 20 mg by mouth daily., Disp: , Rfl:  .  oxybutynin (DITROPAN) 5 MG tablet, Take 5 mg by mouth 3 (three) times daily., Disp: , Rfl:  .  oxyCODONE-acetaminophen (PERCOCET) 10-325 MG per tablet, Take 1-2 tablets by mouth every 8 (eight) hours as needed. Chronic back pain, Disp: , Rfl:  .  allopurinol (ZYLOPRIM) 100 MG tablet, Take 100 mg by mouth daily., Disp: , Rfl:   Allergies  Allergen Reactions  . Flexeril [Cyclobenzaprine Hcl]     Severe Rash  . Vancomycin     Red Mans Syndrome    Review of Systems  Constitutional: Negative.  Negative for fever and chills.  HENT: Positive for hearing loss.   Eyes: Negative for blurred vision and double vision.  Respiratory: Positive for wheezing (occ.). Negative for cough, sputum production and shortness of breath.        Uses O2 at night to sleep.  Cardiovascular: Positive for leg swelling. Negative for chest pain, palpitations and orthopnea.  Gastrointestinal: Negative for heartburn, abdominal pain and blood in stool.  Genitourinary: Negative for dysuria, urgency and frequency.  Skin: Positive  for itching and rash.  Neurological: Negative for headaches.      Objective  Filed Vitals:   03/03/15 1555  BP: 118/67  Pulse: 45  Temp: 98 F (36.7 C)  Resp: 16  Height: '5\' 9"'$  (1.753 m)  Weight: 182 lb 12.8 oz (82.918 kg)     Physical Exam  Constitutional: She is well-developed, well-nourished, and in no distress.  HENT:  Head: Normocephalic and atraumatic.  Eyes: Conjunctivae and EOM are normal. Pupils are equal, round, and reactive to light.  Neck: Normal range of motion. Neck supple. No thyromegaly present.  Cardiovascular: Regular rhythm.  Bradycardia present.   Murmur heard.  Systolic murmur is present with a grade of 2/6  ULSB  Pulmonary/Chest: Effort normal. She has wheezes (faint, irregular espiratory wheezes.). She has no rales.  Musculoskeletal: She exhibits edema (1+  edema of bilateral feet.).  Lymphadenopathy:    She has no cervical adenopathy.  Skin: Rash (6-7 macular papular lesions on bilateral thighs (1-2 mm).  Some excoriation.) noted.  Vitals reviewed.       Assessment & Plan   1. Pedal edema  - furosemide (LASIX) 20 MG tablet; Take 1 tablet (20 mg total) by mouth daily.  Dispense: 30 tablet; Refill: 12  2. Maculopapular rash, localized  - triamcinolone cream (KENALOG) 0.1 %; Apply 1 application topically 2 (two) times daily.  Dispense: 30 g; Refill: 1  3. Bladder dysfunction  - oxybutynin (DITROPAN) 5 MG tablet; Take 1 tablet (5 mg total) by mouth 2 (two) times daily.  Dispense: 60 tablet; Refill: 6

## 2015-03-11 DIAGNOSIS — J45998 Other asthma: Secondary | ICD-10-CM | POA: Diagnosis not present

## 2015-03-27 DIAGNOSIS — Z7189 Other specified counseling: Secondary | ICD-10-CM | POA: Diagnosis not present

## 2015-03-27 DIAGNOSIS — F1120 Opioid dependence, uncomplicated: Secondary | ICD-10-CM | POA: Diagnosis not present

## 2015-03-27 DIAGNOSIS — Z853 Personal history of malignant neoplasm of breast: Secondary | ICD-10-CM | POA: Diagnosis not present

## 2015-03-27 DIAGNOSIS — Z5181 Encounter for therapeutic drug level monitoring: Secondary | ICD-10-CM | POA: Diagnosis not present

## 2015-03-27 DIAGNOSIS — Z0289 Encounter for other administrative examinations: Secondary | ICD-10-CM | POA: Diagnosis not present

## 2015-03-27 DIAGNOSIS — Z79899 Other long term (current) drug therapy: Secondary | ICD-10-CM | POA: Diagnosis not present

## 2015-03-27 DIAGNOSIS — G8929 Other chronic pain: Secondary | ICD-10-CM | POA: Diagnosis not present

## 2015-03-27 DIAGNOSIS — M545 Low back pain: Secondary | ICD-10-CM | POA: Diagnosis not present

## 2015-03-27 DIAGNOSIS — Z79891 Long term (current) use of opiate analgesic: Secondary | ICD-10-CM | POA: Diagnosis not present

## 2015-04-11 ENCOUNTER — Encounter: Payer: Self-pay | Admitting: *Deleted

## 2015-04-11 DIAGNOSIS — J45998 Other asthma: Secondary | ICD-10-CM | POA: Diagnosis not present

## 2015-04-29 ENCOUNTER — Encounter: Payer: Self-pay | Admitting: Obstetrics and Gynecology

## 2015-04-29 ENCOUNTER — Ambulatory Visit (INDEPENDENT_AMBULATORY_CARE_PROVIDER_SITE_OTHER): Payer: Commercial Managed Care - HMO | Admitting: Obstetrics and Gynecology

## 2015-04-29 VITALS — BP 125/74 | HR 90 | Resp 18 | Ht 69.0 in | Wt 174.4 lb

## 2015-04-29 DIAGNOSIS — R350 Frequency of micturition: Secondary | ICD-10-CM | POA: Diagnosis not present

## 2015-04-29 LAB — URINALYSIS, COMPLETE
Bilirubin, UA: NEGATIVE
Glucose, UA: NEGATIVE
Nitrite, UA: NEGATIVE
PH UA: 5 (ref 5.0–7.5)
PROTEIN UA: NEGATIVE
RBC, UA: NEGATIVE
Specific Gravity, UA: 1.01 (ref 1.005–1.030)
UUROB: 0.2 mg/dL (ref 0.2–1.0)

## 2015-04-29 LAB — MICROSCOPIC EXAMINATION
Bacteria, UA: NONE SEEN
RBC MICROSCOPIC, UA: NONE SEEN /HPF (ref 0–?)

## 2015-04-29 LAB — BLADDER SCAN AMB NON-IMAGING

## 2015-04-29 MED ORDER — FESOTERODINE FUMARATE ER 8 MG PO TB24: 8.0000 mg | ORAL_TABLET | Freq: Every day | ORAL | Status: DC

## 2015-04-29 NOTE — Progress Notes (Signed)
04/29/2015 11:47 AM   Kristin [name] 1929-09-29 [id number]  Referring provider: Arlis Porta., MD [place], [place] [address]  Chief Complaint  Patient presents with  . Urinary Frequency    3 mo f/u    HPI: Patient is an 81 year old female with a history of OAB presenting today for follow-up. She was last seen 11/12/14. At that time she recently stopped taking Vesicare because she stated it was too expensive but that it did improve her symptoms of urinary frequency especially nocturia. Her nephrologist had started her back on oxybutynin but she stated that it was not working. At that time she stated that she would like to restart Vesicare and if her insurance company would not cover it she would contact our office with other options of medications. She did not contact our office after her last visit and was not able to fill her prescription for Vesicare.   She is now taking oxybutynin as prescribed by her nephrologist once again and reports that it still has not improved her symptoms. She reports nocturia as her most bothersome symptom. She states that she voids approximately every 1-2 hours per night. Her daytime frequency is less. She reports minimal bilateral lower extremity edema during the day which she states has improved since she was started on a low-sodium diet.  PMH: Past Medical History  Diagnosis Date  . Hypertension   . Hyperlipidemia   . COPD (chronic obstructive pulmonary disease)     wears O2 at 2L via [place] at night  . Cancer     rt breast cancer-post lumpectomy  . Arthritis   . Chronic back pain   . CHF (congestive heart failure)   . Hepatitis C   . Gout   . Arthritis   . CKD (chronic kidney disease)   . OAB (overactive bladder)   . UTI (lower urinary tract infection)   . Cystocele   . Vaginal atrophy   . Bradycardia   . Hematuria   . Urinary frequency     Surgical History: Past Surgical History  Procedure Laterality Date  . Abdominal  hysterectomy    . Joint replacement      bilateral TKA  . Back surgery    . Rotator cuff repair      bilateral  . Breast surgery      rt lumpectomy  . Cataract extraction w/phaco  10/19/2011    Procedure: CATARACT EXTRACTION PHACO AND INTRAOCULAR LENS PLACEMENT (IOC);  Surgeon: Elta Guadeloupe T. Gershon Crane, MD;  Location: [facility];  Service: Ophthalmology;  Laterality: Right;  CDE:10.81  . Cataract extraction w/phaco  11/02/2011    Procedure: CATARACT EXTRACTION PHACO AND INTRAOCULAR LENS PLACEMENT (IOC);  Surgeon: Elta Guadeloupe T. Gershon Crane, MD;  Location: [facility];  Service: Ophthalmology;  Laterality: Left;  CDE 8.60  . Carpal tunnel release      Home Medications:    Medication List       This list is accurate as of: 04/29/15 11:47 AM.  Always use your most recent med list.               albuterol (2.5 MG/3ML) 0.083% nebulizer solution  Commonly known as:  PROVENTIL  Take 2.5 mg by nebulization every 8 (eight) hours as needed. Shortness of breath and wheezing     aspirin 325 MG tablet  Take 325 mg by mouth daily.     calcium citrate-vitamin D 315-200 MG-UNIT per tablet  Commonly known as:  CITRACAL+D  Take  1 tablet by mouth 2 (two) times daily.     Cholecalciferol 2000 UNITS Tbdp  Take by mouth.     fesoterodine 8 MG Tb24 tablet  Commonly known as:  TOVIAZ  Take 1 tablet (8 mg total) by mouth daily.     furosemide 20 MG tablet  Commonly known as:  LASIX  Take 1 tablet (20 mg total) by mouth daily.     levocetirizine 5 MG tablet  Commonly known as:  XYZAL  Take 5 mg by mouth every evening.     lisinopril 10 MG tablet  Commonly known as:  PRINIVIL,ZESTRIL  Take 10 mg by mouth daily.     magnesium hydroxide 400 MG/5ML suspension  Commonly known as:  MILK OF MAGNESIA  Take by mouth daily as needed for mild constipation.     Melatonin 5 MG Caps  Take by mouth.     omeprazole 20 MG capsule  Commonly known as:  PRILOSEC  Take 20 mg by mouth daily.     oxybutynin 5 MG tablet  Commonly  known as:  DITROPAN  Take 1 tablet (5 mg total) by mouth 2 (two) times daily.     oxyCODONE-acetaminophen 10-325 MG per tablet  Commonly known as:  PERCOCET  Take 1-2 tablets by mouth every 8 (eight) hours as needed. Chronic back pain     rOPINIRole 0.5 MG tablet  Commonly known as:  REQUIP  Take 0.5 mg by mouth 3 (three) times daily.     solifenacin 5 MG tablet  Commonly known as:  VESICARE  Take 5 mg by mouth daily.     triamcinolone cream 0.1 %  Commonly known as:  KENALOG  Apply 1 application topically 2 (two) times daily.        Allergies:  Allergies  Allergen Reactions  . Flexeril [Cyclobenzaprine Hcl]     Severe Rash  . Vancomycin     Red Mans Syndrome    Family History: Family History  Problem Relation Age of Onset  . Anesthesia problems Neg Hx   . Hypotension Neg Hx   . Malignant hyperthermia Neg Hx   . Pseudochol deficiency Neg Hx   . Heart disease Father   . Heart disease Mother   . Stroke Mother   . Cancer Sister     Social History:  reports that she has quit smoking. Her smoking use included Cigarettes. She has a 50 pack-year smoking history. She does not have any smokeless tobacco history on file. She reports that she does not drink alcohol or use illicit drugs.  ROS: UROLOGY Frequent Urination?: Yes Hard to postpone urination?: Yes Burning/pain with urination?: Yes Get up at night to urinate?: Yes Leakage of urine?: No Urine stream starts and stops?: Yes Trouble starting stream?: No Do you have to strain to urinate?: No Blood in urine?: No Urinary tract infection?: No Sexually transmitted disease?: No Injury to kidneys or bladder?: No Painful intercourse?: No Weak stream?: Yes Currently pregnant?: No Vaginal bleeding?: No Last menstrual period?: n  Gastrointestinal Nausea?: No Vomiting?: No Indigestion/heartburn?: No Diarrhea?: No Constipation?: Yes  Constitutional Fever: No Night sweats?: No Weight loss?: No Fatigue?:  No  Skin Skin rash/lesions?: No Itching?: No  Eyes Blurred vision?: No Double vision?: No  Ears/Nose/Throat Sore throat?: No Sinus problems?: No  Hematologic/Lymphatic Swollen glands?: No Easy bruising?: Yes  Cardiovascular Leg swelling?: No Chest pain?: No  Respiratory Cough?: No Shortness of breath?: No  Endocrine Excessive thirst?: No  Musculoskeletal Back pain?: Yes  Joint pain?: Yes  Neurological Headaches?: No Dizziness?: No  Psychologic Depression?: No Anxiety?: No  Physical Exam: BP 125/74 mmHg  Pulse 90  Resp 18  Ht '5\' 9"'$  (1.753 m)  Wt 174 lb 6.4 oz (79.107 kg)  BMI 25.74 kg/m2  Constitutional:  Alert and oriented, No acute distress. HEENT: [place] AT, moist mucus membranes.  Trachea midline, no masses. Cardiovascular: No clubbing, cyanosis, or edema. Respiratory: Normal respiratory effort, no increased work of breathing. Skin: No rashes, bruises or suspicious lesions. Lymph: No cervical adenopathy. Neurologic: Grossly intact, no focal deficits, moving all 4 extremities. Psychiatric: Normal mood and affect.  Laboratory Data: Lab Results  Component Value Date   WBC 9.6 09/06/2014   HGB 12.2 09/06/2014   HCT 38.3 09/06/2014   MCV 92 09/06/2014   PLT 229 09/06/2014    Lab Results  Component Value Date   CREATININE 1.63* 09/06/2014   Urinalysis    Component Value Date/Time   COLORURINE YELLOW 02/17/2011 1050   APPEARANCEUR CLOUDY* 02/17/2011 1050   LABSPEC 1.022 02/17/2011 1050   PHURINE 5.5 02/17/2011 1050   GLUCOSEU NEGATIVE 02/17/2011 1050   HGBUR NEGATIVE 02/17/2011 1050   BILIRUBINUR NEGATIVE 02/17/2011 1050   KETONESUR NEGATIVE 02/17/2011 1050   PROTEINUR NEGATIVE 02/17/2011 1050   UROBILINOGEN 0.2 02/17/2011 1050   NITRITE NEGATIVE 02/17/2011 1050   LEUKOCYTESUR MODERATE* 02/17/2011 1050    Pertinent Imaging: 81 year old female with a history of OAB. She is used oxybutynin the past without improvement in symptoms. She  reports moderate improvement with Vesicare but cannot afford the medication. PVRs minimal.  Assessment & Plan:   1. Urinary frequency Patient cannot afford Vesicare. Prescription for Toviaz sent to her pharmacy. I instructed patient to notify our office if this medication is not covered or if she cannot afford it. If medication requires prior authorization I instructed her to notify our office. We discussed options such as PTNS in the future the patient states that she would like to try to [place] first. I encouraged patient also to elevate her lower extremities throughout the day to decrease swelling prior to going to bed at night. -Patient advised of the potential side effects of Toviaz, such as: Dry eyes, dry mouth, constipation, mental confusion and/or urinary retention.  Should confusion or urinary retention occur patient instructed to discontinue medication, notify provider and/or seek immediate medical attention. - Urinalysis, Complete - BLADDER SCAN AMB NON-IMAGING- 72m  Problem List Items Addressed This Visit      Other   Urinary frequency - Primary   Relevant Medications   fesoterodine (TOVIAZ) 8 MG TB24 tablet   Other Relevant Orders   Urinalysis, Complete   BLADDER SCAN AMB NON-IMAGING (Completed)      Return in about 3 months (around 07/29/2015) for recheck OAB.  LHerbert Moors [facility] [address] [place] [place] [phone]

## 2015-05-01 ENCOUNTER — Telehealth: Payer: Self-pay | Admitting: Radiology

## 2015-05-01 NOTE — Telephone Encounter (Signed)
Please advise 

## 2015-05-01 NOTE — Telephone Encounter (Signed)
Pt states Ria Comment ordered a medication that isn't covered by her insurance. Can she get a different medication?

## 2015-05-05 DIAGNOSIS — I495 Sick sinus syndrome: Secondary | ICD-10-CM | POA: Diagnosis not present

## 2015-05-05 DIAGNOSIS — I5032 Chronic diastolic (congestive) heart failure: Secondary | ICD-10-CM | POA: Diagnosis not present

## 2015-05-05 DIAGNOSIS — I10 Essential (primary) hypertension: Secondary | ICD-10-CM | POA: Diagnosis not present

## 2015-05-05 DIAGNOSIS — J449 Chronic obstructive pulmonary disease, unspecified: Secondary | ICD-10-CM | POA: Diagnosis not present

## 2015-05-05 NOTE — Telephone Encounter (Signed)
I believe I prescribed her Vesicare or Toviaz. Will you please call patient and have her call her insurance carrier and see what OAB medications are covered. We could also call her pharmacy and see if they could send over a prior authorization form we could complete if needed.  thanks

## 2015-05-06 ENCOUNTER — Telehealth: Payer: Self-pay

## 2015-05-06 NOTE — Telephone Encounter (Signed)
Pt called requesting another medication than vesicare due to insurance not covering vesicare. Please advise.

## 2015-05-06 NOTE — Telephone Encounter (Signed)
See last telephone encounter for recommendations.

## 2015-05-12 DIAGNOSIS — J45998 Other asthma: Secondary | ICD-10-CM | POA: Diagnosis not present

## 2015-05-13 NOTE — Telephone Encounter (Signed)
I spoke with pt who stated she has only been using the vesicare. Pt stated she was unaware Lisbeth Ply was called into her pharmacy. Nurse reinforced with pt a script for Charlann Boxer should be at her pharmacy, if script is not there pt is to return call for a new script. Pt voiced understanding.

## 2015-06-11 DIAGNOSIS — J45998 Other asthma: Secondary | ICD-10-CM | POA: Diagnosis not present

## 2015-06-17 ENCOUNTER — Encounter: Payer: Self-pay | Admitting: Family Medicine

## 2015-06-17 ENCOUNTER — Ambulatory Visit (INDEPENDENT_AMBULATORY_CARE_PROVIDER_SITE_OTHER): Payer: Commercial Managed Care - HMO | Admitting: Family Medicine

## 2015-06-17 ENCOUNTER — Other Ambulatory Visit: Payer: Self-pay | Admitting: Family Medicine

## 2015-06-17 VITALS — BP 136/82 | HR 66 | Temp 98.4°F | Resp 16 | Ht 69.0 in | Wt 175.2 lb

## 2015-06-17 DIAGNOSIS — R7303 Prediabetes: Secondary | ICD-10-CM | POA: Insufficient documentation

## 2015-06-17 DIAGNOSIS — C50911 Malignant neoplasm of unspecified site of right female breast: Secondary | ICD-10-CM

## 2015-06-17 DIAGNOSIS — I5032 Chronic diastolic (congestive) heart failure: Secondary | ICD-10-CM | POA: Diagnosis not present

## 2015-06-17 DIAGNOSIS — Z23 Encounter for immunization: Secondary | ICD-10-CM | POA: Diagnosis not present

## 2015-06-17 DIAGNOSIS — J431 Panlobular emphysema: Secondary | ICD-10-CM | POA: Diagnosis not present

## 2015-06-17 DIAGNOSIS — I10 Essential (primary) hypertension: Secondary | ICD-10-CM

## 2015-06-17 DIAGNOSIS — R350 Frequency of micturition: Secondary | ICD-10-CM

## 2015-06-17 DIAGNOSIS — J302 Other seasonal allergic rhinitis: Secondary | ICD-10-CM | POA: Diagnosis not present

## 2015-06-17 DIAGNOSIS — Z853 Personal history of malignant neoplasm of breast: Secondary | ICD-10-CM | POA: Insufficient documentation

## 2015-06-17 LAB — POCT URINALYSIS DIPSTICK
Bilirubin, UA: NEGATIVE
Blood, UA: NEGATIVE
Glucose, UA: NEGATIVE
KETONES UA: NEGATIVE
LEUKOCYTES UA: NEGATIVE
PH UA: 5
PROTEIN UA: NEGATIVE
Spec Grav, UA: 1.01
UROBILINOGEN UA: NEGATIVE

## 2015-06-17 MED ORDER — ALBUTEROL SULFATE (2.5 MG/3ML) 0.083% IN NEBU: 2.5000 mg | INHALATION_SOLUTION | Freq: Three times a day (TID) | RESPIRATORY_TRACT | Status: DC | PRN

## 2015-06-17 MED ORDER — LEVOCETIRIZINE DIHYDROCHLORIDE 5 MG PO TABS: 5.0000 mg | ORAL_TABLET | Freq: Every evening | ORAL | Status: DC

## 2015-06-17 NOTE — Progress Notes (Signed)
Name: Kristin [name]   MRN: [id number]    DOB: 05/05/1930   Date:06/17/2015       Progress Note  Subjective  Chief Complaint  Chief Complaint  Patient presents with  . Urinary Frequency    pt has seen by cardio for irregular heart beat  also has some edema and frequent unrination    HPI  Here for f/u of pedal edema and HBP.  Edema much better since she cut back on salt.  She still c./o urinary frequency.  Vesicare helped a lot but not covered byu insurance.  Lisbeth Ply helps a little.  Has see [facility] for this in past.  Has seen  Dr./ Parachous for Card. F/u.  No problem-specific assessment & plan notes found for this encounter.   Past Medical History  Diagnosis Date  . Hypertension   . Hyperlipidemia   . COPD (chronic obstructive pulmonary disease) (HCC)     wears O2 at 2L via [place] at night  . Cancer ([place])     rt breast cancer-post lumpectomy  . Arthritis   . Chronic back pain   . CHF (congestive heart failure) ([place])   . Hepatitis C   . Gout   . Arthritis   . CKD (chronic kidney disease)   . OAB (overactive bladder)   . UTI (lower urinary tract infection)   . Cystocele   . Vaginal atrophy   . Bradycardia   . Hematuria   . Urinary frequency     Social History  Substance Use Topics  . Smoking status: Former Smoker -- 1.25 packs/day for 40 years    Types: Cigarettes  . Smokeless tobacco: Not on file  . Alcohol Use: No     Current outpatient prescriptions:  .  albuterol (PROVENTIL) (2.5 MG/3ML) 0.083% nebulizer solution, Take 2.5 mg by nebulization every 8 (eight) hours as needed. Shortness of breath and wheezing, Disp: , Rfl:  .  aspirin 325 MG tablet, Take 325 mg by mouth daily., Disp: , Rfl:  .  calcium citrate-vitamin D (CITRACAL+D) 315-200 MG-UNIT per tablet, Take 1 tablet by mouth 2 (two) times daily., Disp: , Rfl:  .  Cholecalciferol 2000 UNITS TBDP, Take by mouth., Disp: , Rfl:  .  fesoterodine (TOVIAZ) 8 MG TB24 tablet, Take 1 tablet (8 mg total) by  mouth daily., Disp: 30 tablet, Rfl: 4 .  furosemide (LASIX) 20 MG tablet, Take 1 tablet (20 mg total) by mouth daily., Disp: 30 tablet, Rfl: 12 .  levocetirizine (XYZAL) 5 MG tablet, Take 5 mg by mouth every evening., Disp: , Rfl:  .  lisinopril (PRINIVIL,ZESTRIL) 10 MG tablet, Take 10 mg by mouth daily., Disp: , Rfl:  .  magnesium hydroxide (MILK OF MAGNESIA) 400 MG/5ML suspension, Take by mouth daily as needed for mild constipation., Disp: , Rfl:  .  Melatonin 5 MG CAPS, Take by mouth., Disp: , Rfl:  .  omeprazole (PRILOSEC) 20 MG capsule, Take 20 mg by mouth daily., Disp: , Rfl:  .  oxyCODONE-acetaminophen (PERCOCET) 10-325 MG per tablet, Take 1-2 tablets by mouth every 8 (eight) hours as needed. Chronic back pain, Disp: , Rfl:  .  rOPINIRole (REQUIP) 0.5 MG tablet, Take 0.5 mg by mouth 3 (three) times daily., Disp: , Rfl:  .  triamcinolone cream (KENALOG) 0.1 %, Apply 1 application topically 2 (two) times daily., Disp: 30 g, Rfl: 1  Allergies  Allergen Reactions  . Flexeril [Cyclobenzaprine Hcl]     Severe Rash  . Vancomycin  [place] Syndrome    Review of Systems  Constitutional: Negative for fever, chills, weight loss and malaise/fatigue.  HENT: Negative for hearing loss.   Eyes: Negative for blurred vision and double vision.  Respiratory: Positive for wheezing (rare). Negative for cough, sputum production and shortness of breath.   Cardiovascular: Positive for leg swelling. Negative for chest pain, palpitations and orthopnea.  Gastrointestinal: Negative for heartburn, abdominal pain and constipation.  Genitourinary: Positive for frequency. Negative for dysuria and urgency.  Musculoskeletal: Negative for myalgias and joint pain.  Skin: Negative for rash.  Neurological: Negative for dizziness, tremors, weakness and headaches.      Objective  Filed Vitals:   06/17/15 1035  BP: 136/82  Pulse: 66  Temp: 98.4 F (36.9 C)  TempSrc: Oral  Resp: 16  Height: '5\' 9"'$  (1.753  m)  Weight: 175 lb 3.2 oz (79.47 kg)     Physical Exam  Constitutional: She is oriented to person, place, and time and well-developed, well-nourished, and in no distress. No distress.  HENT:  Head: Normocephalic and atraumatic.  Eyes: Conjunctivae and EOM are normal. Pupils are equal, round, and reactive to light. No scleral icterus.  Neck: Normal range of motion. Neck supple. Carotid bruit is not present. No thyromegaly present.  Cardiovascular: Normal rate, regular rhythm and intact distal pulses.  Exam reveals no gallop and no friction rub.   Murmur heard.  Systolic murmur is present with a grade of 1/6  throughout  Pulmonary/Chest: Effort normal and breath sounds normal. No respiratory distress. She has no wheezes. She has no rales.  Abdominal: Soft. Bowel sounds are normal. There is no tenderness.  Musculoskeletal: Normal range of motion. She exhibits no edema.  Lymphadenopathy:    She has no cervical adenopathy.  Neurological: She is alert and oriented to person, place, and time.  Vitals reviewed.     Recent Results (from the past 2160 hour(s))  Urinalysis, Complete     Status: Abnormal   Collection Time: 04/29/15 10:38 AM  Result Value Ref Range   Specific Gravity, UA 1.010 1.005 - 1.030   pH, UA 5.0 5.0 - 7.5   Color, UA Yellow Yellow   Appearance Ur Clear Clear   Leukocytes, UA 2+ (A) Negative   Protein, UA Negative Negative/Trace   Glucose, UA Negative Negative   Ketones, UA Trace (A) Negative   RBC, UA Negative Negative   Bilirubin, UA Negative Negative   Urobilinogen, Ur 0.2 0.2 - 1.0 mg/dL   Nitrite, UA Negative Negative   Microscopic Examination See below:   Microscopic Examination     Status: Abnormal   Collection Time: 04/29/15 10:38 AM  Result Value Ref Range   WBC, UA 6-10 (A) 0 -  5 /hpf   RBC, UA None seen 0 -  2 /hpf   Epithelial Cells (non renal) 0-10 0 - 10 /hpf   Mucus, UA Present (A) Not Estab.   Bacteria, UA None seen None seen/Few  BLADDER  SCAN AMB NON-IMAGING     Status: None   Collection Time: 04/29/15 11:04 AM  Result Value Ref Range   Scan Result 29m   POCT urinalysis dipstick     Status: Normal   Collection Time: 06/17/15 11:30 AM  Result Value Ref Range   Color, UA yellow    Clarity, UA clear    Glucose, UA negative    Bilirubin, UA negative    Ketones, UA negative    Spec Grav, UA 1.010    Blood,  UA negative    pH, UA 5.0    Protein, UA negative    Urobilinogen, UA negative    Nitrite, UA neagtive    Leukocytes, UA Negative Negative     Assessment & Plan  1. Chronic diastolic heart failure ([place])   2. Frequency of urination  - POCT urinalysis dipstick  3. Panlobular emphysema (HCC)  - albuterol (PROVENTIL) (2.5 MG/3ML) 0.083% nebulizer solution; Take 3 mLs (2.5 mg total) by nebulization every 8 (eight) hours as needed. Shortness of breath and wheezing  Dispense: 75 mL; Refill: 12  4. Essential hypertension   5. Need for influenza vaccination  - Flu vaccine HIGH DOSE PF (Fluzone High dose)  6. Seasonal allergies  - levocetirizine (XYZAL) 5 MG tablet; Take 1 tablet (5 mg total) by mouth every evening.  Dispense: 30 tablet; Refill: 12  7. Malignant neoplasm of right female breast, unspecified site of breast ([place])  - MM Digital Diagnostic Bilat; Future

## 2015-06-17 NOTE — Addendum Note (Signed)
Addended by: Frederich Cha D on: 06/17/2015 02:17 PM   Modules accepted: Miquel Dunn

## 2015-06-17 NOTE — Addendum Note (Signed)
Addended by: Frederich Cha D on: 06/17/2015 01:48 PM   Modules accepted: Orders, SmartSet

## 2015-06-17 NOTE — Patient Instructions (Addendum)
Make appt. With [facility] to further discuss urinary sx.  Continue current medsaat current doses.

## 2015-06-18 ENCOUNTER — Other Ambulatory Visit: Payer: Self-pay | Admitting: Family Medicine

## 2015-06-18 DIAGNOSIS — Z1239 Encounter for other screening for malignant neoplasm of breast: Secondary | ICD-10-CM

## 2015-06-18 DIAGNOSIS — C50911 Malignant neoplasm of unspecified site of right female breast: Secondary | ICD-10-CM

## 2015-06-19 LAB — URINE CULTURE

## 2015-06-25 ENCOUNTER — Other Ambulatory Visit: Payer: Self-pay | Admitting: Pain Medicine

## 2015-06-25 ENCOUNTER — Ambulatory Visit: Payer: Commercial Managed Care - HMO | Attending: Pain Medicine | Admitting: Pain Medicine

## 2015-06-25 ENCOUNTER — Encounter: Payer: Self-pay | Admitting: Pain Medicine

## 2015-06-25 VITALS — BP 144/89 | HR 62 | Temp 98.2°F | Resp 18 | Ht 69.0 in | Wt 175.0 lb

## 2015-06-25 DIAGNOSIS — M4310 Spondylolisthesis, site unspecified: Secondary | ICD-10-CM

## 2015-06-25 DIAGNOSIS — K5900 Constipation, unspecified: Secondary | ICD-10-CM

## 2015-06-25 DIAGNOSIS — G2581 Restless legs syndrome: Secondary | ICD-10-CM

## 2015-06-25 DIAGNOSIS — G96198 Other disorders of meninges, not elsewhere classified: Secondary | ICD-10-CM

## 2015-06-25 DIAGNOSIS — M4726 Other spondylosis with radiculopathy, lumbar region: Secondary | ICD-10-CM | POA: Diagnosis not present

## 2015-06-25 DIAGNOSIS — Z87891 Personal history of nicotine dependence: Secondary | ICD-10-CM | POA: Diagnosis not present

## 2015-06-25 DIAGNOSIS — M47816 Spondylosis without myelopathy or radiculopathy, lumbar region: Secondary | ICD-10-CM | POA: Insufficient documentation

## 2015-06-25 DIAGNOSIS — G8929 Other chronic pain: Secondary | ICD-10-CM | POA: Insufficient documentation

## 2015-06-25 DIAGNOSIS — I509 Heart failure, unspecified: Secondary | ICD-10-CM | POA: Diagnosis not present

## 2015-06-25 DIAGNOSIS — M549 Dorsalgia, unspecified: Secondary | ICD-10-CM | POA: Insufficient documentation

## 2015-06-25 DIAGNOSIS — M4806 Spinal stenosis, lumbar region: Secondary | ICD-10-CM | POA: Diagnosis not present

## 2015-06-25 DIAGNOSIS — Z79891 Long term (current) use of opiate analgesic: Secondary | ICD-10-CM

## 2015-06-25 DIAGNOSIS — T402X5A Adverse effect of other opioids, initial encounter: Secondary | ICD-10-CM

## 2015-06-25 DIAGNOSIS — M5441 Lumbago with sciatica, right side: Secondary | ICD-10-CM

## 2015-06-25 DIAGNOSIS — Z5181 Encounter for therapeutic drug level monitoring: Secondary | ICD-10-CM

## 2015-06-25 DIAGNOSIS — F1120 Opioid dependence, uncomplicated: Secondary | ICD-10-CM | POA: Diagnosis not present

## 2015-06-25 DIAGNOSIS — M7918 Myalgia, other site: Secondary | ICD-10-CM

## 2015-06-25 DIAGNOSIS — E559 Vitamin D deficiency, unspecified: Secondary | ICD-10-CM

## 2015-06-25 DIAGNOSIS — G9619 Other disorders of meninges, not elsewhere classified: Secondary | ICD-10-CM

## 2015-06-25 DIAGNOSIS — R7303 Prediabetes: Secondary | ICD-10-CM | POA: Diagnosis not present

## 2015-06-25 DIAGNOSIS — I341 Nonrheumatic mitral (valve) prolapse: Secondary | ICD-10-CM

## 2015-06-25 DIAGNOSIS — M810 Age-related osteoporosis without current pathological fracture: Secondary | ICD-10-CM | POA: Diagnosis not present

## 2015-06-25 DIAGNOSIS — Z79899 Other long term (current) drug therapy: Secondary | ICD-10-CM

## 2015-06-25 DIAGNOSIS — I129 Hypertensive chronic kidney disease with stage 1 through stage 4 chronic kidney disease, or unspecified chronic kidney disease: Secondary | ICD-10-CM | POA: Diagnosis not present

## 2015-06-25 DIAGNOSIS — M25559 Pain in unspecified hip: Secondary | ICD-10-CM

## 2015-06-25 DIAGNOSIS — M533 Sacrococcygeal disorders, not elsewhere classified: Secondary | ICD-10-CM

## 2015-06-25 DIAGNOSIS — F1190 Opioid use, unspecified, uncomplicated: Secondary | ICD-10-CM | POA: Diagnosis not present

## 2015-06-25 DIAGNOSIS — G894 Chronic pain syndrome: Secondary | ICD-10-CM | POA: Diagnosis not present

## 2015-06-25 DIAGNOSIS — Z8614 Personal history of Methicillin resistant Staphylococcus aureus infection: Secondary | ICD-10-CM | POA: Insufficient documentation

## 2015-06-25 DIAGNOSIS — M5442 Lumbago with sciatica, left side: Secondary | ICD-10-CM

## 2015-06-25 DIAGNOSIS — M545 Low back pain, unspecified: Secondary | ICD-10-CM

## 2015-06-25 DIAGNOSIS — M961 Postlaminectomy syndrome, not elsewhere classified: Secondary | ICD-10-CM | POA: Diagnosis not present

## 2015-06-25 DIAGNOSIS — M791 Myalgia: Secondary | ICD-10-CM

## 2015-06-25 DIAGNOSIS — M25519 Pain in unspecified shoulder: Secondary | ICD-10-CM | POA: Insufficient documentation

## 2015-06-25 DIAGNOSIS — M539 Dorsopathy, unspecified: Secondary | ICD-10-CM

## 2015-06-25 DIAGNOSIS — M489 Spondylopathy, unspecified: Secondary | ICD-10-CM

## 2015-06-25 DIAGNOSIS — M792 Neuralgia and neuritis, unspecified: Secondary | ICD-10-CM

## 2015-06-25 DIAGNOSIS — M2428 Disorder of ligament, vertebrae: Secondary | ICD-10-CM

## 2015-06-25 DIAGNOSIS — M47896 Other spondylosis, lumbar region: Secondary | ICD-10-CM

## 2015-06-25 DIAGNOSIS — K5909 Other constipation: Secondary | ICD-10-CM

## 2015-06-25 DIAGNOSIS — Z22322 Carrier or suspected carrier of Methicillin resistant Staphylococcus aureus: Secondary | ICD-10-CM

## 2015-06-25 DIAGNOSIS — M4600 Spinal enthesopathy, site unspecified: Secondary | ICD-10-CM

## 2015-06-25 DIAGNOSIS — M48061 Spinal stenosis, lumbar region without neurogenic claudication: Secondary | ICD-10-CM | POA: Insufficient documentation

## 2015-06-25 DIAGNOSIS — K5903 Drug induced constipation: Secondary | ICD-10-CM | POA: Diagnosis not present

## 2015-06-25 DIAGNOSIS — M5416 Radiculopathy, lumbar region: Secondary | ICD-10-CM

## 2015-06-25 HISTORY — DX: Hypomagnesemia: E83.42

## 2015-06-25 MED ORDER — OXYCODONE-ACETAMINOPHEN 10-325 MG PO TABS: 2.0000 | ORAL_TABLET | Freq: Three times a day (TID) | ORAL | Status: DC | PRN

## 2015-06-25 MED ORDER — BENEFIBER PO POWD: ORAL | Status: DC

## 2015-06-25 MED ORDER — BISACODYL 5 MG PO TBEC: 10.0000 mg | DELAYED_RELEASE_TABLET | Freq: Every day | ORAL | Status: DC | PRN

## 2015-06-25 MED ORDER — MAGNESIUM OXIDE 400 MG PO TABS: 400.0000 mg | ORAL_TABLET | Freq: Every day | ORAL | Status: DC

## 2015-06-25 MED ORDER — ROPINIROLE HCL 2 MG PO TABS: 2.0000 mg | ORAL_TABLET | Freq: Every day | ORAL | Status: DC

## 2015-06-25 NOTE — Progress Notes (Signed)
Did not bring pills for pill count.  Reminded to bring to each appointment.

## 2015-06-25 NOTE — Progress Notes (Signed)
Patient's Name: Kristin [name] MRN: [id number] DOB: 1929/10/03 DOS: 06/25/2015  Primary Reason(s) for Visit: Encounter for Medication Management. CC: Back Pain; Leg Pain; and Shoulder Pain   HPI:   Kristin [name] is a 81 y.o. year old, female patient, who returns today as an established patient. She has Abnormal glucose tolerance test; Chronic diastolic heart failure ([place]); Chronic kidney disease (CKD), stage III (moderate); CAFL (chronic airflow limitation) ([place]); Complete rotator cuff rupture of left shoulder; CCF (congestive cardiac failure) ([place]); DDD (degenerative disc disease), lumbosacral; Gout; Essential (primary) hypertension; Benign essential HTN; BP (high blood pressure); H/O cardiac catheterization; Arrhythmia, sinus node ([place]); Upper respiratory infection, viral; Lumbar canal stenosis; Urinary frequency; Chronic obstructive pulmonary disease ([place]); Congestive heart failure ([place]); Degeneration of intervertebral disc of lumbosacral region; 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[place]); History of cardiac catheterization; Sinoatrial node dysfunction ([place]); Need for influenza vaccination; Frequency of urination; Seasonal allergies; Breast cancer, right breast ([place]); Chronic pain; Chronic pain syndrome; Chronic low back pain (L>R); Lumbar facet syndrome (Bilateral) (L>R); Lumbar spondylosis; Chronic lumbar radicular pain (Bilateral) (L>R) (L5 Dermat[place]); Restless leg syndrome; Myofascial pain; Neuropathic pain; Neurogenic pain; Long term current use of opiate analgesic; Long term prescription opiate use; Opiate use; Opiate dependence ([place]); Encounter for therapeutic drug level monitoring; Chronic constipation; Therapeutic opioid-induced constipation (OIC); Vitamin D insufficiency; Grade 1 Retrolisthesis of L1 over L2; Lumbar facet hypertrophy (L1-2, L2-3, and L4-5); Stenosis of lateral recess of lumbar spine (L1-2 and L4-5); 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 Her primarily concern today is the Back Pain; Leg Pain; and Shoulder Pain    The patient returns today for follow-up of observation. He seems to be doing well with her current medication therapy. However, she thinks that the medication that we use for restless legs syndrome needs to be increased. She was taking Requip 1 mg by mouth at bedtime. We will increase this to 2 mg. She has been using the MiraLAX daily and because of this, I have recommended this she switched to Benefiber. Today's Pain Score: 7  Pain Type: Chronic pain Pain Location: Back Pain Orientation: Lower Pain Descriptors / Indicators: Aching Pain Frequency: Constant  Date of Last Visit: Date of Last Visit: 03/27/15 Service Provided on Last Visit: Service Provided on Last Visit: Med Refill  Pharmacotherapy Review:   Side-effects or Adverse reactions: None reported. Effectiveness: Described as relatively effective, allowing for increase in activities of daily living (ADL). Onset of action: Within expected pharmacological parameters. Duration of action: Within normal limits for medication. Peak effect: Timing and results are as within normal expected parameters.  PMP: Compliant with practice rules and regulations. DST: Compliant with practice rules and regulations. Lab work: No new labs ordered by our practice. Treatment compliance: Compliant. Substance Use Disorder (SUD) Risk Level: Low Planned course of action: Continue therapy as is.  Allergies: Kristin [name] is allergic to flexeril and vancomycin.  Meds: The patient has a current medication list which includes the following prescription(s):  albuterol, aspirin, calcium citrate-vitamin d, vitamin d3, fesoterodine, furosemide, levocetirizine, lisinopril, magnesium hydroxide, magnesium oxide, omeprazole, triamcinolone cream, bisacodyl, melatonin, oxycodone-acetaminophen, oxycodone-acetaminophen, oxycodone-acetaminophen, ropinirole, and benefiber. Requested Prescriptions   Signed Prescriptions Disp Refills  . magnesium oxide (MAG-OX) 400 MG tablet 30 tablet 2  Sig: Take 1 tablet (400 mg total) by mouth daily.  Marland Kitchen rOPINIRole (REQUIP) 2 MG tablet 30 tablet 2    Sig: Take 1 tablet (2 mg total) by mouth at bedtime.  Marland Kitchen oxyCODONE-acetaminophen (PERCOCET) 10-325 MG tablet 180 tablet 0    Sig: Take 2 tablets by mouth every 8 (eight) hours as needed for pain.  Marland Kitchen oxyCODONE-acetaminophen (PERCOCET) 10-325 MG tablet 180 tablet 0    Sig: Take 2 tablets by mouth every 8 (eight) hours as needed for pain.  Marland Kitchen oxyCODONE-acetaminophen (PERCOCET) 10-325 MG tablet 180 tablet 0    Sig: Take 2 tablets by mouth every 8 (eight) hours as needed for pain.  . Wheat Dextrin (BENEFIBER) POWD 500 g PRN    Sig: Stir 2 teaspoons of Benefiber into 4-8 oz of any non-carbonated beverage or soft food (hot or cold) PO TID  . bisacodyl (DULCOLAX) 5 MG EC tablet 30 tablet PRN    Sig: Take 2 tablets (10 mg total) by mouth daily as needed for moderate constipation.    ROS: Constitutional: Afebrile, no chills, well hydrated and well nourished Gastrointestinal: negative Musculoskeletal:negative Neurological: negative Behavioral/Psych: negative  PFSH: Medical:  Kristin [name]  has a past medical history of Hypertension; Hyperlipidemia; COPD (chronic obstructive pulmonary disease) ([place]); Cancer ([place]); Arthritis; Chronic back pain; CHF (congestive heart failure) ([place]); Hepatitis C; Gout; Arthritis; CKD (chronic kidney disease); OAB (overactive bladder); UTI (lower urinary tract infection); Cystocele; Vaginal atrophy; Bradycardia; Hematuria; Urinary frequency; and Hypomagnesemia  (06/25/2015). Family: family history includes Cancer in her sister; Heart disease in her father and mother; Stroke in her mother. There is no history of Anesthesia problems, Hypotension, Malignant hyperthermia, or Pseudochol deficiency. Surgical:  has past surgical history that includes Abdominal hysterectomy; Joint replacement; Back surgery; Rotator cuff repair; Breast surgery; Cataract extraction w/PHACO (10/19/2011); Cataract extraction w/PHACO (11/02/2011); and Carpal tunnel release. Tobacco:  reports that she has quit smoking. Her smoking use included Cigarettes. She has a 50 pack-year smoking history. She does not have any smokeless tobacco history on file. Alcohol:  reports that she does not drink alcohol. Drug:  reports that she does not use illicit drugs.  Physical Exam: Vitals:  Today's Vitals   06/25/15 1153 06/25/15 1200  BP: 144/89   Pulse: 62   Temp: 98.2 F (36.8 C)   TempSrc: Oral   Resp: 18   Height: '5\' 9"'$  (1.753 m)   Weight: 175 lb (79.379 kg)   SpO2: 100%   PainSc: 7  7   PainLoc: Back   Calculated BMI: Body mass index is 25.83 kg/(m^2). General appearance: alert, cooperative, appears stated age, mild distress and moderately obese Eyes: conjunctivae/corneas clear. PERRL, EOM's intact. Fundi benign. Lungs: No evidence respiratory distress, no audible rales or ronchi and no use of accessory muscles of respiration Neck: no adenopathy, no carotid bruit, no JVD, supple, symmetrical, trachea midline and thyroid not enlarged, symmetric, no tenderness/mass/nodules Back: symmetric, no curvature. ROM normal. No CVA tenderness. Extremities: extremities normal, atraumatic, no cyanosis or edema Pulses: 2+ and symmetric Skin: Skin color, texture, turgor normal. No rashes or lesions Neurologic: Gait: Antalgic    Assessment: Encounter Diagnosis:  Primary Diagnosis: Chronic pain [G89.29]  Plan: Angelyna was seen today for back pain, leg pain and shoulder pain.  Diagnoses and all  orders for this visit:  Chronic pain -     COMPLETE METABOLIC PANEL WITH GFR; Future -     C-reactive protein; Future -     Magnesium; Future -  Sedimentation rate; Future -     Vitamin D2,D3 Panel; Future -     Discontinue: oxyCODONE-acetaminophen (PERCOCET) 10-325 MG tablet; Take 2 tablets by mouth every 8 (eight) hours as needed for pain. -     Discontinue: oxyCODONE-acetaminophen (PERCOCET) 10-325 MG tablet; Take 2 tablets by mouth every 8 (eight) hours as needed for pain. -     Discontinue: oxyCODONE-acetaminophen (PERCOCET) 10-325 MG tablet; Take 2 tablets by mouth every 8 (eight) hours as needed for pain. -     oxyCODONE-acetaminophen (PERCOCET) 10-325 MG tablet; Take 2 tablets by mouth every 8 (eight) hours as needed for pain. -     oxyCODONE-acetaminophen (PERCOCET) 10-325 MG tablet; Take 2 tablets by mouth every 8 (eight) hours as needed for pain. -     oxyCODONE-acetaminophen (PERCOCET) 10-325 MG tablet; Take 2 tablets by mouth every 8 (eight) hours as needed for pain.  Chronic pain syndrome  Chronic low back pain  Lumbar facet syndrome  Osteoarthritis of spine with radiculopathy, lumbar region  Chronic radicular lumbar pain  Restless leg syndrome -     rOPINIRole (REQUIP) 2 MG tablet; Take 1 tablet (2 mg total) by mouth at bedtime.  Myofascial pain  Neuropathic pain  Neurogenic pain  Long term current use of opiate analgesic -     Drugs of abuse screen w/o alc, rtn urine-sln; Future  Long term prescription opiate use  Opiate use  Uncomplicated opioid dependence ([place])  Encounter for therapeutic drug level monitoring  Chronic constipation -     magnesium oxide (MAG-OX) 400 MG tablet; Take 1 tablet (400 mg total) by mouth daily. -     Wheat Dextrin (BENEFIBER) POWD; Stir 2 teaspoons of Benefiber into 4-8 oz of any non-carbonated beverage or soft food (hot or cold) PO TID -     bisacodyl (DULCOLAX) 5 MG EC tablet; Take 2 tablets (10 mg total) by mouth daily  as needed for moderate constipation.  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 Medications discontinued today:  Medications Discontinued During This Encounter  Medication Reason  . Cholecalciferol 2000 UNITS TBDP Error  . oxybutynin (DITROPAN-XL) 5 MG 24 hr tablet Error  . rOPINIRole (REQUIP) 0.5 MG tablet Error  . rOPINIRole (REQUIP) 1 MG tablet Change in therapy  . magnesium oxide (MAG-OX) 400 MG tablet Reorder  . oxyCODONE-acetaminophen (PERCOCET) 10-325 MG per tablet Reorder  . oxyCODONE-acetaminophen (PERCOCET) 10-325 MG tablet Cost of medication  . oxyCODONE-acetaminophen (PERCOCET) 10-325 MG tablet Cost of medication  . oxyCODONE-acetaminophen (PERCOCET) 10-325 MG tablet Cost of medication   Medications administered today:  Ms. Vallin had no medications administered during this visit.  Primary Care Physician: Dicky Doe, MD Location: [facility] Note by: Kathlen Brunswick. Dossie Arbour, M.D, DABA, DABAPM, DABPM, DABIPP, FIPP

## 2015-06-25 NOTE — Progress Notes (Signed)
Safety precautions to be maintained throughout the outpatient stay will include: orient to surroundings, keep bed in low position, maintain call bell within reach at all times, provide assistance with transfer out of bed and ambulation.  

## 2015-07-03 LAB — TOXASSURE SELECT 13 (MW), URINE: PDF: 0

## 2015-07-07 ENCOUNTER — Other Ambulatory Visit: Payer: Self-pay | Admitting: Family Medicine

## 2015-07-07 ENCOUNTER — Ambulatory Visit
Admission: RE | Admit: 2015-07-07 | Discharge: 2015-07-07 | Disposition: A | Discharge status: Home / Self Care | Payer: Commercial Managed Care - HMO | Source: Ambulatory Visit | Attending: Family Medicine | Admitting: Family Medicine

## 2015-07-07 DIAGNOSIS — Z1231 Encounter for screening mammogram for malignant neoplasm of breast: Secondary | ICD-10-CM | POA: Insufficient documentation

## 2015-07-07 DIAGNOSIS — Z853 Personal history of malignant neoplasm of breast: Secondary | ICD-10-CM | POA: Diagnosis not present

## 2015-07-07 DIAGNOSIS — Z1239 Encounter for other screening for malignant neoplasm of breast: Secondary | ICD-10-CM

## 2015-07-07 DIAGNOSIS — C50911 Malignant neoplasm of unspecified site of right female breast: Secondary | ICD-10-CM

## 2015-07-12 DIAGNOSIS — J45998 Other asthma: Secondary | ICD-10-CM | POA: Diagnosis not present

## 2015-07-29 ENCOUNTER — Ambulatory Visit (INDEPENDENT_AMBULATORY_CARE_PROVIDER_SITE_OTHER): Payer: Commercial Managed Care - HMO | Admitting: Obstetrics and Gynecology

## 2015-07-29 ENCOUNTER — Other Ambulatory Visit: Payer: Self-pay | Admitting: Pain Medicine

## 2015-07-29 ENCOUNTER — Encounter: Payer: Self-pay | Admitting: Obstetrics and Gynecology

## 2015-07-29 VITALS — BP 94/59 | HR 44 | Resp 18 | Ht 69.0 in | Wt 175.7 lb

## 2015-07-29 DIAGNOSIS — R350 Frequency of micturition: Secondary | ICD-10-CM

## 2015-07-29 LAB — URINALYSIS, COMPLETE
BILIRUBIN UA: NEGATIVE
Glucose, UA: NEGATIVE
Ketones, UA: NEGATIVE
NITRITE UA: NEGATIVE
PH UA: 5 (ref 5.0–7.5)
PROTEIN UA: NEGATIVE
Specific Gravity, UA: 1.02 (ref 1.005–1.030)
UUROB: 0.2 mg/dL (ref 0.2–1.0)

## 2015-07-29 LAB — MICROSCOPIC EXAMINATION: RBC MICROSCOPIC, UA: NONE SEEN /HPF (ref 0–?)

## 2015-07-29 LAB — BLADDER SCAN AMB NON-IMAGING

## 2015-07-29 NOTE — Progress Notes (Signed)
07/29/2015 3:19 PM   Kristin [name] November 07, 1929 [id number]  Referring provider: Arlis Porta., MD [address] [place], [place] [address]  Chief Complaint  Patient presents with  . Urinary Frequency    HPI: Patient is an 81 year old female with a history of overactive bladder with most bothersome symptom of nocturia. She has tried oxybutynin, Toviaz and Vesicare in the past. She states the Vesicare improved her symptoms the most that she quit that she was unable to afford it because her insurance would not cover the medication.  Most recently she completed a trial of Toviaz and stated that his did not prove improve her symptoms. She reports her symptoms are the worst at night. When she leaves the house and is out running errands she noticed that her symptoms improve.     PMH: Past Medical History  Diagnosis Date  . Hypertension   . Hyperlipidemia   . COPD (chronic obstructive pulmonary disease) (HCC)     wears O2 at 2L via [place] at night  . Arthritis   . Chronic back pain   . CHF (congestive heart failure) ([place])   . Hepatitis C   . Gout   . Arthritis   . CKD (chronic kidney disease)   . OAB (overactive bladder)   . UTI (lower urinary tract infection)   . Cystocele   . Vaginal atrophy   . Bradycardia   . Hematuria   . Urinary frequency   . Hypomagnesemia 06/25/2015  . Cancer [facility]) 2004    rt breast cancer-post lumpectomy- chemo/rad    Surgical History: Past Surgical History  Procedure Laterality Date  . Abdominal hysterectomy    . Joint replacement      bilateral TKA  . Back surgery    . Rotator cuff repair      bilateral  . Breast surgery      rt lumpectomy  . Cataract extraction w/phaco  10/19/2011    Procedure: CATARACT EXTRACTION PHACO AND INTRAOCULAR LENS PLACEMENT (IOC);  Surgeon: Elta Guadeloupe T. Gershon Crane, MD;  Location: [facility];  Service: Ophthalmology;  Laterality: Right;  CDE:10.81  . Cataract extraction w/phaco  11/02/2011    Procedure: CATARACT EXTRACTION PHACO AND  INTRAOCULAR LENS PLACEMENT (IOC);  Surgeon: Elta Guadeloupe T. Gershon Crane, MD;  Location: [facility];  Service: Ophthalmology;  Laterality: Left;  CDE 8.60  . Carpal tunnel release      Home Medications:    Medication List       This list is accurate as of: 07/29/15 11:59 PM.  Always use your most recent med list.               albuterol (2.5 MG/3ML) 0.083% nebulizer solution  Commonly known as:  PROVENTIL  Take 3 mLs (2.5 mg total) by nebulization every 8 (eight) hours as needed. Shortness of breath and wheezing     aspirin 325 MG tablet  Take 325 mg by mouth daily.     BENEFIBER Powd  Stir 2 teaspoons of Benefiber into 4-8 oz of any non-carbonated beverage or soft food (hot or cold) PO TID     bisacodyl 5 MG EC tablet  Commonly known as:  DULCOLAX  Take 2 tablets (10 mg total) by mouth daily as needed for moderate constipation.     calcium citrate-vitamin D 315-200 MG-UNIT tablet  Commonly known as:  CITRACAL+D  Take 1 tablet by mouth 2 (two) times daily.     fesoterodine 8 MG Tb24 tablet  Commonly known as:  [employer]  Take 1 tablet (8 mg total) by mouth daily.     furosemide 20 MG tablet  Commonly known as:  LASIX  Take 1 tablet (20 mg total) by mouth daily.     levocetirizine 5 MG tablet  Commonly known as:  XYZAL  Take 1 tablet (5 mg total) by mouth every evening.     lisinopril 10 MG tablet  Commonly known as:  PRINIVIL,ZESTRIL  Take 10 mg by mouth daily.     magnesium oxide 400 MG tablet  Commonly known as:  MAG-OX  Take 1 tablet (400 mg total) by mouth daily.     Melatonin 5 MG Caps  Take 2 capsules by mouth at bedtime.     omeprazole 20 MG capsule  Commonly known as:  PRILOSEC  Take 20 mg by mouth daily.     oxybutynin 5 MG tablet  Commonly known as:  DITROPAN  Take 2.5 mg by mouth.     oxyCODONE-acetaminophen 10-325 MG tablet  Commonly known as:  PERCOCET  Take 2 tablets by mouth every 8 (eight) hours as needed for pain.     rOPINIRole 2 MG tablet  Commonly  known as:  REQUIP  Take 1 tablet (2 mg total) by mouth at bedtime.     triamcinolone cream 0.1 %  Commonly known as:  KENALOG  Apply 1 application topically 2 (two) times daily.     Vitamin D3 2000 UNITS Tabs  Take 1 tablet by mouth daily.        Allergies:  Allergies  Allergen Reactions  . Flexeril [Cyclobenzaprine Hcl]     Severe Rash  . Vancomycin     Red [place] Syndrome    Family History: Family History  Problem Relation Age of Onset  . Anesthesia problems Neg Hx   . Hypotension Neg Hx   . Malignant hyperthermia Neg Hx   . Pseudochol deficiency Neg Hx   . Heart disease Father   . Heart disease Mother   . Stroke Mother   . Cancer Sister   . Breast cancer Sister 57  . Breast cancer Paternal Aunt     Social History:  reports that she has quit smoking. Her smoking use included Cigarettes. She has a 50 pack-year smoking history. She does not have any smokeless tobacco history on file. She reports that she does not drink alcohol or use illicit drugs.  ROS: UROLOGY Frequent Urination?: Yes Hard to postpone urination?: Yes Burning/pain with urination?: No Get up at night to urinate?: Yes Leakage of urine?: No Urine stream starts and stops?: Yes Trouble starting stream?: No Do you have to strain to urinate?: No Blood in urine?: No Urinary tract infection?: No Sexually transmitted disease?: No Injury to kidneys or bladder?: No Painful intercourse?: No Weak stream?: Yes Currently pregnant?: No Vaginal bleeding?: No Last menstrual period?: n  Gastrointestinal Nausea?: No Vomiting?: No Indigestion/heartburn?: No Diarrhea?: No Constipation?: Yes  Constitutional Fever: No Night sweats?: No Weight loss?: No Fatigue?: No  Skin Skin rash/lesions?: No Itching?: No  Eyes Blurred vision?: No Double vision?: No  Ears/Nose/Throat Sore throat?: No Sinus problems?: No  Hematologic/Lymphatic Swollen glands?: No Easy bruising?: No  Cardiovascular Leg  swelling?: Yes Chest pain?: No  Respiratory Cough?: No Shortness of breath?: Yes  Endocrine Excessive thirst?: No  Musculoskeletal Back pain?: Yes Joint pain?: Yes  Neurological Headaches?: No Dizziness?: No  Psychologic Depression?: No Anxiety?: No  Physical Exam: BP 94/59 mmHg  Pulse 44  Resp 18  Ht '5\' 9"'$  (  1.753 m)  Wt 175 lb 11.2 oz (79.697 kg)  BMI 25.93 kg/m2  Constitutional:  Alert and oriented, No acute distress. HEENT: [place] AT, moist mucus membranes.  Trachea midline, no masses. Cardiovascular: No clubbing, cyanosis, or edema. Respiratory: Normal respiratory effort, no increased work of breathing. Skin: No rashes, bruises or suspicious lesions. Neurologic: Grossly intact, no focal deficits, moving all 4 extremities. Psychiatric: Normal mood and affect.  Laboratory Data:   Urinalysis    Component Value Date/Time   COLORURINE YELLOW 02/17/2011 1050   APPEARANCEUR CLOUDY* 02/17/2011 1050   LABSPEC 1.022 02/17/2011 1050   PHURINE 5.5 02/17/2011 1050   GLUCOSEU Negative 07/29/2015 1006   HGBUR NEGATIVE 02/17/2011 1050   BILIRUBINUR Negative 07/29/2015 1006   BILIRUBINUR negative 06/17/2015 1130   BILIRUBINUR NEGATIVE 02/17/2011 1050   KETONESUR NEGATIVE 02/17/2011 1050   PROTEINUR negative 06/17/2015 1130   PROTEINUR NEGATIVE 02/17/2011 1050   UROBILINOGEN negative 06/17/2015 1130   UROBILINOGEN 0.2 02/17/2011 1050   NITRITE Negative 07/29/2015 1006   NITRITE neagtive 06/17/2015 1130   NITRITE NEGATIVE 02/17/2011 1050   LEUKOCYTESUR 1+* 07/29/2015 1006   LEUKOCYTESUR Negative 06/17/2015 1130    Pertinent Imaging:   Assessment & Plan:    1. Urinary frequency/Nocturia-  Recommend sleep study for evaluation for possible sleep apnea. Asian instructed to discontinue Toviaz and was provided samples of Myrbetriq today. Return to care in 1 month to reassess symptoms. - Urinalysis, Complete - BLADDER SCAN AMB NON-IMAGING   Return in about 1 month  (around 08/29/2015).  These notes generated with voice recognition software. I apologize for typographical errors.  Herbert Moors, [facility] [address] [place], [place] [address] [phone]

## 2015-08-26 DIAGNOSIS — J0100 Acute maxillary sinusitis, unspecified: Secondary | ICD-10-CM | POA: Diagnosis not present

## 2015-08-26 DIAGNOSIS — J441 Chronic obstructive pulmonary disease with (acute) exacerbation: Secondary | ICD-10-CM | POA: Diagnosis not present

## 2015-09-03 ENCOUNTER — Ambulatory Visit (INDEPENDENT_AMBULATORY_CARE_PROVIDER_SITE_OTHER): Payer: Medicare Other | Admitting: Obstetrics and Gynecology

## 2015-09-03 ENCOUNTER — Encounter: Payer: Self-pay | Admitting: Obstetrics and Gynecology

## 2015-09-03 VITALS — BP 127/80 | HR 88 | Resp 16 | Ht 69.0 in | Wt 179.5 lb

## 2015-09-03 DIAGNOSIS — R350 Frequency of micturition: Secondary | ICD-10-CM | POA: Diagnosis not present

## 2015-09-03 DIAGNOSIS — R351 Nocturia: Secondary | ICD-10-CM | POA: Diagnosis not present

## 2015-09-03 LAB — URINALYSIS, COMPLETE
BILIRUBIN UA: NEGATIVE
GLUCOSE, UA: NEGATIVE
Nitrite, UA: NEGATIVE
SPEC GRAV UA: 1.015 (ref 1.005–1.030)
UUROB: 0.2 mg/dL (ref 0.2–1.0)
pH, UA: 5.5 (ref 5.0–7.5)

## 2015-09-03 LAB — MICROSCOPIC EXAMINATION: RBC, UA: NONE SEEN /hpf (ref 0–?)

## 2015-09-03 LAB — BLADDER SCAN AMB NON-IMAGING

## 2015-09-03 NOTE — Progress Notes (Signed)
1:17 PM   Kristin [name] 11-Feb-1930 [id number]  Referring provider: Arlis Porta., MD [place], [place] [address]  Chief Complaint  Patient presents with  . Urinary Frequency  . Follow-up    1 month    HPI: Patient is an 82 year old female with a history of overactive bladder with most bothersome symptom of nocturia. She has tried oxybutynin, Toviaz and Vesicare in the past. She states the Vesicare improved her symptoms the most that she quit that she was unable to afford it because her insurance would not cover the medication.  Most recently she completed a trial of Myrbetriq and stated that his did not prove improve her symptoms. She reports her symptoms are the worst at night. When she leaves the house and is out running errands she noticed that her symptoms improve.     Continued bothersome symptom of nocturia  Voiding every 2-3 hrs. Otherwise she states she is asymptomatic.  No lower extremity swelling.  PMH: Past Medical History  Diagnosis Date  . Hypertension   . Hyperlipidemia   . COPD (chronic obstructive pulmonary disease) (HCC)     wears O2 at 2L via [place] at night  . Arthritis   . Chronic back pain   . CHF (congestive heart failure) ([facility])   . Hepatitis C   . Gout   . Arthritis   . CKD (chronic kidney disease)   . OAB (overactive bladder)   . UTI (lower urinary tract infection)   . Cystocele   . Vaginal atrophy   . Bradycardia   . Hematuria   . Urinary frequency   . Hypomagnesemia 06/25/2015  . Cancer [facility]) 2004    rt breast cancer-post lumpectomy- chemo/rad    Surgical History: Past Surgical History  Procedure Laterality Date  . Abdominal hysterectomy    . Joint replacement      bilateral TKA  . Back surgery    . Rotator cuff repair      bilateral  . Breast surgery      rt lumpectomy  . Cataract extraction w/phaco  10/19/2011    Procedure: CATARACT EXTRACTION PHACO AND INTRAOCULAR LENS PLACEMENT (IOC);  Surgeon: Elta Guadeloupe T. Gershon Crane, MD;   Location: [facility];  Service: Ophthalmology;  Laterality: Right;  CDE:10.81  . Cataract extraction w/phaco  11/02/2011    Procedure: CATARACT EXTRACTION PHACO AND INTRAOCULAR LENS PLACEMENT (IOC);  Surgeon: Elta Guadeloupe T. Gershon Crane, MD;  Location: [facility];  Service: Ophthalmology;  Laterality: Left;  CDE 8.60  . Carpal tunnel release      Home Medications:    Medication List       This list is accurate as of: 09/03/15  1:17 PM.  Always use your most recent med list.               albuterol (2.5 MG/3ML) 0.083% nebulizer solution  Commonly known as:  PROVENTIL  Take 3 mLs (2.5 mg total) by nebulization every 8 (eight) hours as needed. Shortness of breath and wheezing     aspirin 325 MG tablet  Take 325 mg by mouth daily.     BENEFIBER Powd  Stir 2 teaspoons of Benefiber into 4-8 oz of any non-carbonated beverage or soft food (hot or cold) PO TID     bisacodyl 5 MG EC tablet  Commonly known as:  DULCOLAX  Take 2 tablets (10 mg total) by mouth daily as needed for moderate constipation.     calcium citrate-vitamin D 315-200 MG-UNIT tablet  Commonly known as:  CITRACAL+D  Take 1 tablet by mouth 2 (two) times daily.     fesoterodine 8 MG Tb24 tablet  Commonly known as:  TOVIAZ  Take 1 tablet (8 mg total) by mouth daily.     furosemide 20 MG tablet  Commonly known as:  LASIX  Take 1 tablet (20 mg total) by mouth daily.     levocetirizine 5 MG tablet  Commonly known as:  XYZAL  Take 1 tablet (5 mg total) by mouth every evening.     lisinopril 10 MG tablet  Commonly known as:  PRINIVIL,ZESTRIL  Take 10 mg by mouth daily.     magnesium oxide 400 MG tablet  Commonly known as:  MAG-OX  Take 1 tablet (400 mg total) by mouth daily.     Melatonin 5 MG Caps  Take 2 capsules by mouth at bedtime.     omeprazole 20 MG capsule  Commonly known as:  PRILOSEC  Take 20 mg by mouth daily.     oxyCODONE-acetaminophen 10-325 MG tablet  Commonly known as:  PERCOCET  Take 2 tablets by mouth every  8 (eight) hours as needed for pain.     predniSONE 10 MG (48) Tbpk tablet  Commonly known as:  STERAPRED UNI-PAK 48 TAB     rOPINIRole 2 MG tablet  Commonly known as:  REQUIP  Take 1 tablet (2 mg total) by mouth at bedtime.     triamcinolone cream 0.1 %  Commonly known as:  KENALOG  Apply 1 application topically 2 (two) times daily.     Vitamin D3 2000 units Tabs  Take 1 tablet by mouth daily.        Allergies:  Allergies  Allergen Reactions  . Cyclobenzaprine Rash    Severe Rash  . Flexeril [Cyclobenzaprine Hcl]     Severe Rash  . Vancomycin     [place] Syndrome    Family History: Family History  Problem Relation Age of Onset  . Anesthesia problems Neg Hx   . Hypotension Neg Hx   . Malignant hyperthermia Neg Hx   . Pseudochol deficiency Neg Hx   . Heart disease Father   . Heart disease Mother   . Stroke Mother   . Cancer Sister   . Breast cancer Sister 18  . Breast cancer Paternal Aunt     Social History:  reports that she has quit smoking. Her smoking use included Cigarettes. She has a 50 pack-year smoking history. She does not have any smokeless tobacco history on file. She reports that she does not drink alcohol or use illicit drugs.  ROS: UROLOGY Frequent Urination?: Yes Hard to postpone urination?: Yes Burning/pain with urination?: No Get up at night to urinate?: Yes Leakage of urine?: No Urine stream starts and stops?: No Trouble starting stream?: No Do you have to strain to urinate?: No Blood in urine?: No Urinary tract infection?: No Sexually transmitted disease?: No Injury to kidneys or bladder?: No Painful intercourse?: No Weak stream?: Yes Currently pregnant?: No Vaginal bleeding?: No Last menstrual period?: n  Gastrointestinal Nausea?: No Vomiting?: No Indigestion/heartburn?: No Diarrhea?: No Constipation?: Yes  Constitutional Fever: No Night sweats?: No Weight loss?: No Fatigue?: No  Skin Skin rash/lesions?: No Itching?:  No  Eyes Blurred vision?: No Double vision?: No  Ears/Nose/Throat Sore throat?: No Sinus problems?: No  Hematologic/Lymphatic Swollen glands?: No Easy bruising?: No  Cardiovascular Leg swelling?: Yes Chest pain?: No  Respiratory Cough?: No Shortness of breath?: Yes  Endocrine  Excessive thirst?: No  Musculoskeletal Back pain?: Yes Joint pain?: Yes  Neurological Headaches?: No Dizziness?: No  Psychologic Depression?: No Anxiety?: No  Physical Exam: BP 127/80 mmHg  Pulse 88  Resp 16  Ht '5\' 9"'$  (1.753 m)  Wt 179 lb 8 oz (81.421 kg)  BMI 26.50 kg/m2  Constitutional:  Alert and oriented, No acute distress. HEENT: [place] AT, moist mucus membranes.  Trachea midline, no masses. Cardiovascular: No clubbing, cyanosis, or edema. Respiratory: Normal respiratory effort, no increased work of breathing. Skin: No rashes, bruises or suspicious lesions. Neurologic: Grossly intact, no focal deficits, moving all 4 extremities. Psychiatric: Normal mood and affect.  Laboratory Data:   Urinalysis Results for orders placed or performed in visit on 09/03/15  BLADDER SCAN AMB NON-IMAGING  Result Value Ref Range   Scan Result 14 mL     Pertinent Imaging:   Assessment & Plan:    1. Urinary frequency/Nocturia-  Recommend sleep study for evaluation for possible sleep apnea.Patient states that she does have an appt with her PCP but she has not had a sleep study yet. Patient has tried multiple anticholinergics as well as Myrbetriq without improvement in nocturia.  I reviewed the possible benefits of the PTNS treatments. Patient is not interested at this time. She would like to undergo her sleep study in then return for reassessment of her symptoms and 6 months. I encouraged her to remember to limit her fluid intake 4 hours prior to going to bed. - Urinalysis, Complete - BLADDER SCAN AMB NON-IMAGING- PVR 37m today    Return in about 6 months (around 03/02/2016).  These notes  generated with voice recognition software. I apologize for typographical errors.  LHerbert Moors [facility] [address] [place] [phone]

## 2015-09-11 DIAGNOSIS — J45998 Other asthma: Secondary | ICD-10-CM | POA: Diagnosis not present

## 2015-09-19 ENCOUNTER — Other Ambulatory Visit: Payer: Self-pay | Admitting: Pain Medicine

## 2015-09-24 ENCOUNTER — Ambulatory Visit: Payer: Medicare Other | Attending: Pain Medicine | Admitting: Pain Medicine

## 2015-09-24 ENCOUNTER — Other Ambulatory Visit: Payer: Self-pay | Admitting: Pain Medicine

## 2015-09-24 ENCOUNTER — Encounter: Payer: Self-pay | Admitting: Pain Medicine

## 2015-09-24 VITALS — BP 141/76 | HR 70 | Temp 98.8°F | Resp 16 | Ht 69.0 in | Wt 178.0 lb

## 2015-09-24 DIAGNOSIS — G2581 Restless legs syndrome: Secondary | ICD-10-CM

## 2015-09-24 DIAGNOSIS — K5903 Drug induced constipation: Secondary | ICD-10-CM

## 2015-09-24 DIAGNOSIS — K5909 Other constipation: Secondary | ICD-10-CM

## 2015-09-24 DIAGNOSIS — M545 Low back pain, unspecified: Secondary | ICD-10-CM

## 2015-09-24 DIAGNOSIS — E559 Vitamin D deficiency, unspecified: Secondary | ICD-10-CM

## 2015-09-24 DIAGNOSIS — Z5181 Encounter for therapeutic drug level monitoring: Secondary | ICD-10-CM | POA: Diagnosis not present

## 2015-09-24 DIAGNOSIS — G8929 Other chronic pain: Secondary | ICD-10-CM

## 2015-09-24 DIAGNOSIS — Z79891 Long term (current) use of opiate analgesic: Secondary | ICD-10-CM

## 2015-09-24 DIAGNOSIS — T402X5A Adverse effect of other opioids, initial encounter: Secondary | ICD-10-CM

## 2015-09-24 DIAGNOSIS — Z79899 Other long term (current) drug therapy: Secondary | ICD-10-CM | POA: Diagnosis not present

## 2015-09-24 DIAGNOSIS — K5900 Constipation, unspecified: Secondary | ICD-10-CM

## 2015-09-24 DIAGNOSIS — M47816 Spondylosis without myelopathy or radiculopathy, lumbar region: Secondary | ICD-10-CM

## 2015-09-24 LAB — COMPREHENSIVE METABOLIC PANEL
ALBUMIN: 4 g/dL (ref 3.5–5.0)
ALT: 15 U/L (ref 14–54)
ANION GAP: 5 (ref 5–15)
AST: 24 U/L (ref 15–41)
Alkaline Phosphatase: 82 U/L (ref 38–126)
BILIRUBIN TOTAL: 0.7 mg/dL (ref 0.3–1.2)
BUN: 22 mg/dL — AB (ref 6–20)
CHLORIDE: 107 mmol/L (ref 101–111)
CO2: 28 mmol/L (ref 22–32)
Calcium: 10 mg/dL (ref 8.9–10.3)
Creatinine, Ser: 1.22 mg/dL — ABNORMAL HIGH (ref 0.44–1.00)
GFR calc Af Amer: 46 mL/min — ABNORMAL LOW (ref >60)
GFR, EST NON AFRICAN AMERICAN: 40 mL/min — AB (ref >60)
Glucose, Bld: 87 mg/dL (ref 65–99)
POTASSIUM: 4.3 mmol/L (ref 3.5–5.1)
Sodium: 140 mmol/L (ref 135–145)
TOTAL PROTEIN: 7.4 g/dL (ref 6.5–8.1)

## 2015-09-24 LAB — SEDIMENTATION RATE: Sed Rate: 12 mm/hr (ref 0–30)

## 2015-09-24 LAB — MAGNESIUM: MAGNESIUM: 2.2 mg/dL (ref 1.7–2.4)

## 2015-09-24 MED ORDER — OXYCODONE-ACETAMINOPHEN 10-325 MG PO TABS: 2.0000 | ORAL_TABLET | Freq: Three times a day (TID) | ORAL | Status: DC | PRN

## 2015-09-24 MED ORDER — MAGNESIUM OXIDE 400 MG PO TABS: 400.0000 mg | ORAL_TABLET | Freq: Every day | ORAL | Status: DC

## 2015-09-24 MED ORDER — ROPINIROLE HCL 2 MG PO TABS: 2.0000 mg | ORAL_TABLET | Freq: Every day | ORAL | Status: DC

## 2015-09-24 MED ORDER — BENEFIBER PO POWD: ORAL | Status: DC

## 2015-09-24 MED ORDER — VITAMIN D3 50 MCG (2000 UT) PO TABS: 1.0000 | ORAL_TABLET | Freq: Every day | ORAL | Status: DC

## 2015-09-24 MED ORDER — BISACODYL 5 MG PO TBEC: 10.0000 mg | DELAYED_RELEASE_TABLET | Freq: Every day | ORAL | Status: DC | PRN

## 2015-09-24 NOTE — Progress Notes (Signed)
Patient here for medication management.  Percocet 10-325 qty 0.  Last fill 08/23/15 Patient would also like to increase qty of Biscodyl.  Last qty was 30, to be taken 2 tabs qd prn constipation.

## 2015-09-24 NOTE — Telephone Encounter (Signed)
Note: It is the responsibility of all patients to have an up to date list of all the medications that need to be refilled at the time of their appointment.  Pain Management Policy: Medication changes and refills will be conducted during appointments only, after determining if the medical indications for the use of the medication are still present. No fax, phone, or electronic refills will be conducted outside of an evaluation appointment. Patients are encouraged to call and set up an appointment to comply with stipulated policy. 

## 2015-09-24 NOTE — Patient Instructions (Signed)
GENERAL RISKS AND COMPLICATIONS  What are the risk, side effects and possible complications? Generally speaking, most procedures are safe.  However, with any procedure there are risks, side effects, and the possibility of complications.  The risks and complications are dependent upon the sites that are lesioned, or the type of nerve block to be performed.  The closer the procedure is to the spine, the more serious the risks are.  Great care is taken when placing the radio frequency needles, block needles or lesioning probes, but sometimes complications can occur. 1. Infection: Any time there is an injection through the skin, there is a risk of infection.  This is why sterile conditions are used for these blocks.  There are four possible types of infection. 1. Localized skin infection. 2. Central Nervous System Infection-This can be in the form of Meningitis, which can be deadly. 3. Epidural Infections-This can be in the form of an epidural abscess, which can cause pressure inside of the spine, causing compression of the spinal cord with subsequent paralysis. This would require an emergency surgery to decompress, and there are no guarantees that the patient would recover from the paralysis. 4. Discitis-This is an infection of the intervertebral discs.  It occurs in about 1% of discography procedures.  It is difficult to treat and it may lead to surgery.        2. Pain: the needles have to go through skin and soft tissues, will cause soreness.       3. Damage to internal structures:  The nerves to be lesioned may be near blood vessels or    other nerves which can be potentially damaged.       4. Bleeding: Bleeding is more common if the patient is taking blood thinners such as  aspirin, Coumadin, Ticiid, Plavix, etc., or if he/she have some genetic predisposition  such as hemophilia. Bleeding into the spinal canal can cause compression of the spinal  cord with subsequent paralysis.  This would require an  emergency surgery to  decompress and there are no guarantees that the patient would recover from the  paralysis.       5. Pneumothorax:  Puncturing of a lung is a possibility, every time a needle is introduced in  the area of the chest or upper back.  Pneumothorax refers to free air around the  collapsed lung(s), inside of the thoracic cavity (chest cavity).  Another two possible  complications related to a similar event would include: Hemothorax and Chylothorax.   These are variations of the Pneumothorax, where instead of air around the collapsed  lung(s), you may have blood or chyle, respectively.       6. Spinal headaches: They may occur with any procedures in the area of the spine.       7. Persistent CSF (Cerebro-Spinal Fluid) leakage: This is a rare problem, but may occur  with prolonged intrathecal or epidural catheters either due to the formation of a fistulous  track or a dural tear.       8. Nerve damage: By working so close to the spinal cord, there is always a possibility of  nerve damage, which could be as serious as a permanent spinal cord injury with  paralysis.       9. Death:  Although rare, severe deadly allergic reactions known as "Anaphylactic  reaction" can occur to any of the medications used.      10. Worsening of the symptoms:  We can always make thing worse.    What are the chances of something like this happening? Chances of any of this occuring are extremely low.  By statistics, you have more of a chance of getting killed in a motor vehicle accident: while driving to the hospital than any of the above occurring .  Nevertheless, you should be aware that they are possibilities.  In general, it is similar to taking a shower.  Everybody knows that you can slip, hit your head and get killed.  Does that mean that you should not shower again?  Nevertheless always keep in mind that statistics do not mean anything if you happen to be on the wrong side of them.  Even if a procedure has a 1  (one) in a 1,000,000 (million) chance of going wrong, it you happen to be that one..Also, keep in mind that by statistics, you have more of a chance of having something go wrong when taking medications.  Who should not have this procedure? If you are on a blood thinning medication (e.g. Coumadin, Plavix, see list of "Blood Thinners"), or if you have an active infection going on, you should not have the procedure.  If you are taking any blood thinners, please inform your physician.  How should I prepare for this procedure?  Do not eat or drink anything at least six hours prior to the procedure.  Bring a driver with you .  It cannot be a taxi.  Come accompanied by an adult that can drive you back, and that is strong enough to help you if your legs get weak or numb from the local anesthetic.  Take all of your medicines the morning of the procedure with just enough water to swallow them.  If you have diabetes, make sure that you are scheduled to have your procedure done first thing in the morning, whenever possible.  If you have diabetes, take only half of your insulin dose and notify our nurse that you have done so as soon as you arrive at the clinic.  If you are diabetic, but only take blood sugar pills (oral hypoglycemic), then do not take them on the morning of your procedure.  You may take them after you have had the procedure.  Do not take aspirin or any aspirin-containing medications, at least eleven (11) days prior to the procedure.  They may prolong bleeding.  Wear loose fitting clothing that may be easy to take off and that you would not mind if it got stained with Betadine or blood.  Do not wear any jewelry or perfume  Remove any nail coloring.  It will interfere with some of our monitoring equipment.  NOTE: Remember that this is not meant to be interpreted as a complete list of all possible complications.  Unforeseen problems may occur.  BLOOD THINNERS The following drugs  contain aspirin or other products, which can cause increased bleeding during surgery and should not be taken for 2 weeks prior to and 1 week after surgery.  If you should need take something for relief of minor pain, you may take acetaminophen which is found in Tylenol,m Datril, Anacin-3 and Panadol. It is not blood thinner. The products listed below are.  Do not take any of the products listed below in addition to any listed on your instruction sheet.  A.P.C or A.P.C with Codeine Codeine Phosphate Capsules #3 Ibuprofen Ridaura  ABC compound Congesprin Imuran rimadil  Advil Cope Indocin Robaxisal  Alka-Seltzer Effervescent Pain Reliever and Antacid Coricidin or Coricidin-D  Indomethacin Rufen    Alka-Seltzer plus Cold Medicine Cosprin Ketoprofen S-A-C Tablets  Anacin Analgesic Tablets or Capsules Coumadin Korlgesic Salflex  Anacin Extra Strength Analgesic tablets or capsules CP-2 Tablets Lanoril Salicylate  Anaprox Cuprimine Capsules Levenox Salocol  Anexsia-D Dalteparin Magan Salsalate  Anodynos Darvon compound Magnesium Salicylate Sine-off  Ansaid Dasin Capsules Magsal Sodium Salicylate  Anturane Depen Capsules Marnal Soma  APF Arthritis pain formula Dewitt's Pills Measurin Stanback  Argesic Dia-Gesic Meclofenamic Sulfinpyrazone  Arthritis Bayer Timed Release Aspirin Diclofenac Meclomen Sulindac  Arthritis pain formula Anacin Dicumarol Medipren Supac  Analgesic (Safety coated) Arthralgen Diffunasal Mefanamic Suprofen  Arthritis Strength Bufferin Dihydrocodeine Mepro Compound Suprol  Arthropan liquid Dopirydamole Methcarbomol with Aspirin Synalgos  ASA tablets/Enseals Disalcid Micrainin Tagament  Ascriptin Doan's Midol Talwin  Ascriptin A/D Dolene Mobidin Tanderil  Ascriptin Extra Strength Dolobid Moblgesic Ticlid  Ascriptin with Codeine Doloprin or Doloprin with Codeine Momentum Tolectin  Asperbuf Duoprin Mono-gesic Trendar  Aspergum Duradyne Motrin or Motrin IB Triminicin  Aspirin  plain, buffered or enteric coated Durasal Myochrisine Trigesic  Aspirin Suppositories Easprin Nalfon Trillsate  Aspirin with Codeine Ecotrin Regular or Extra Strength Naprosyn Uracel  Atromid-S Efficin Naproxen Ursinus  Auranofin Capsules Elmiron Neocylate Vanquish  Axotal Emagrin Norgesic Verin  Azathioprine Empirin or Empirin with Codeine Normiflo Vitamin E  Azolid Emprazil Nuprin Voltaren  Bayer Aspirin plain, buffered or children's or timed BC Tablets or powders Encaprin Orgaran Warfarin Sodium  Buff-a-Comp Enoxaparin Orudis Zorpin  Buff-a-Comp with Codeine Equegesic Os-Cal-Gesic   Buffaprin Excedrin plain, buffered or Extra Strength Oxalid   Bufferin Arthritis Strength Feldene Oxphenbutazone   Bufferin plain or Extra Strength Feldene Capsules Oxycodone with Aspirin   Bufferin with Codeine Fenoprofen Fenoprofen Pabalate or Pabalate-SF   Buffets II Flogesic Panagesic   Buffinol plain or Extra Strength Florinal or Florinal with Codeine Panwarfarin   Buf-Tabs Flurbiprofen Penicillamine   Butalbital Compound Four-way cold tablets Penicillin   Butazolidin Fragmin Pepto-Bismol   Carbenicillin Geminisyn Percodan   Carna Arthritis Reliever Geopen Persantine   Carprofen Gold's salt Persistin   Chloramphenicol Goody's Phenylbutazone   Chloromycetin Haltrain Piroxlcam   Clmetidine heparin Plaquenil   Cllnoril Hyco-pap Ponstel   Clofibrate Hydroxy chloroquine Propoxyphen         Before stopping any of these medications, be sure to consult the physician who ordered them.  Some, such as Coumadin (Warfarin) are ordered to prevent or treat serious conditions such as "deep thrombosis", "pumonary embolisms", and other heart problems.  The amount of time that you may need off of the medication may also vary with the medication and the reason for which you were taking it.  If you are taking any of these medications, please make sure you notify your pain physician before you undergo any  procedures.         Facet Blocks Patient Information  Description: The facets are joints in the spine between the vertebrae.  Like any joints in the body, facets can become irritated and painful.  Arthritis can also effect the facets.  By injecting steroids and local anesthetic in and around these joints, we can temporarily block the nerve supply to them.  Steroids act directly on irritated nerves and tissues to reduce selling and inflammation which often leads to decreased pain.  Facet blocks may be done anywhere along the spine from the neck to the low back depending upon the location of your pain.   After numbing the skin with local anesthetic (like Novocaine), a small needle is passed onto the facet joints under x-ray guidance.    You may experience a sensation of pressure while this is being done.  The entire block usually lasts about 15-25 minutes.   Conditions which may be treated by facet blocks:   Low back/buttock pain  Neck/shoulder pain  Certain types of headaches  Preparation for the injection:  1. Do not eat any solid food or dairy products within 6 hours of your appointment. 2. You may drink clear liquid up to 2 hours before appointment.  Clear liquids include water, black coffee, juice or soda.  No milk or cream please. 3. You may take your regular medication, including pain medications, with a sip of water before your appointment.  Diabetics should hold regular insulin (if taken separately) and take 1/2 normal NPH dose the morning of the procedure.  Carry some sugar containing items with you to your appointment. 4. A driver must accompany you and be prepared to drive you home after your procedure. 5. Bring all your current medications with you. 6. An IV may be inserted and sedation may be given at the discretion of the physician. 7. A blood pressure cuff, EKG and other monitors will often be applied during the procedure.  Some patients may need to have extra oxygen  administered for a short period. 8. You will be asked to provide medical information, including your allergies and medications, prior to the procedure.  We must know immediately if you are taking blood thinners (like Coumadin/Warfarin) or if you are allergic to IV iodine contrast (dye).  We must know if you could possible be pregnant.  Possible side-effects:   Bleeding from needle site  Infection (rare, may require surgery)  Nerve injury (rare)  Numbness & tingling (temporary)  Difficulty urinating (rare, temporary)  Spinal headache (a headache worse with upright posture)  Light-headedness (temporary)  Pain at injection site (serveral days)  Decreased blood pressure (rare, temporary)  Weakness in arm/leg (temporary)  Pressure sensation in back/neck (temporary)   Call if you experience:   Fever/chills associated with headache or increased back/neck pain  Headache worsened by an upright position  New onset, weakness or numbness of an extremity below the injection site  Hives or difficulty breathing (go to the emergency room)  Inflammation or drainage at the injection site(s)  Severe back/neck pain greater than usual  New symptoms which are concerning to you  Please note:  Although the local anesthetic injected can often make your back or neck feel good for several hours after the injection, the pain will likely return. It takes 3-7 days for steroids to work.  You may not notice any pain relief for at least one week.  If effective, we will often do a series of 2-3 injections spaced 3-6 weeks apart to maximally decrease your pain.  After the initial series, you may be a candidate for a more permanent nerve block of the facets.  If you have any questions, please call [phone] [facility] 

## 2015-09-25 LAB — C-REACTIVE PROTEIN: CRP: <0.5 mg/dL (ref <1.0)

## 2015-09-26 NOTE — Progress Notes (Signed)
Patient's Name: Kristin [name] MRN: [id number] DOB: Nov 27, 1929 DOS: 09/24/2015  Primary Reason(s) for Visit: Encounter for Medication Management CC: Back Pain   HPI  Kristin [name] is a 82 y.o. year old, female patient, who returns today as an established patient. She has Abnormal glucose tolerance test; Chronic diastolic heart failure ([place]); Chronic kidney disease (CKD), stage III (moderate); CAFL (chronic airflow limitation) ([place]); Complete rotator cuff rupture of shoulder (Left); CCF (congestive cardiac failure) (HCC); DDD (degenerative disc disease), lumbosacral; Gout; Essential (primary) hypertension; Benign essential HTN; BP (high blood pressure); H/O cardiac catheterization; Arrhythmia, sinus node ([place]); Upper respiratory infection, viral; Lumbar canal stenosis; Urinary frequency; Chronic obstructive pulmonary disease ([place]); Congestive heart failure ([employer]); Degeneration of intervertebral disc of lumbosacral region; 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[place]); History of cardiac catheterization; Sinoatrial node dysfunction ([place]); Need for influenza vaccination; Frequency of urination; Seasonal allergies; Breast cancer, right breast ([place]); Chronic pain; Chronic pain syndrome; Chronic low back pain (Location of Primary Source of Pain) (Bilateral) (L>R); Lumbar facet syndrome (Bilateral) (L>R); Lumbar spondylosis; Chronic lumbar radicular pain (Bilateral) (L>R) (L5 Dermat[place]); Restless leg syndrome; Myofascial pain; Neuropathic pain; Neurogenic pain; Long term current use of opiate analgesic; Long term prescription opiate use; Opiate use; Opiate dependence ([place]); Encounter for therapeutic drug level monitoring; Chronic constipation; Therapeutic opioid-induced constipation (OIC); Vitamin D insufficiency; Grade 1 Retrolisthesis of L1 over L2; Lumbar facet hypertrophy (L1-2, L2-3, and L4-5); Stenosis of lateral recess of lumbar spine (L1-2 and L4-5); 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 Her primarily concern today is the Back Pain   The patient returns to the clinics today for pharmacological management of her chronic pain. She is doing rather well on her current medication regimen and we have been doing PRN bilateral lumbar facet blocks under fluoroscopic guidance.  Reported Pain Score: 7  (last pain medicine this a.m.), clinically she looks like a 2/10. Reported level is inconsistent with clinical obrservations. Pain Type: Chronic pain Pain Location: Back Pain Orientation: Lower Pain Descriptors / Indicators: Constant, Aching Pain Frequency: Constant  Date of Last Visit: 06/25/15 Service Provided on Last Visit: Med Refill  Pharmacotherapy  Medication(s): The patient currently takes Percocet 10/325 2 tablets every 8 hours when necessary for the pain. We have been prescribing this for this patient. Onset of action: Within expected pharmacological parameters. (30-45 minutes) Time to Peak effect: Timing and results are as within normal expected parameters. (One hour) Analgesic Effect: More than 50% (60%) Activity Facilitation: Medication(s) allow patient to sit, stand, walk, and do the basic ADLs Perceived Effectiveness: Described as relatively effective, allowing for increase in activities of daily living (ADL) Side-effects or Adverse reactions: None reported Duration of action: Within normal limits for medication. (6 hours) [place] PMP: Compliant with practice rules and regulations UDS Results: The patient's last UDS done on 06/25/2015 came back within normal limits with no unexpected results. UDS Interpretation: Patient appears  to be compliant with practice rules and regulations Medication Assessment Form: Reviewed. Patient indicates being compliant with therapy Treatment compliance: Compliant Substance Use Disorder (SUD) Risk Level: Low Pharmacologic Plan: Continue therapy as is  Lab Work: Illicit Drugs No results found for: THCU, COCAINSCRNUR, PCPSCRNUR, MDMA, AMPHETMU, METHADONE, ETOH  Inflammation Markers Lab Results  Component Value Date  ESRSEDRATE 12 09/24/2015   CRP <0.5 09/24/2015    Renal Function Lab Results  Component Value Date   BUN 22* 09/24/2015   CREATININE 1.22* 09/24/2015   GFRAA 46* 09/24/2015   GFRNONAA 40* 09/24/2015    Hepatic Function Lab Results  Component Value Date   AST 24 09/24/2015   ALT 15 09/24/2015   ALBUMIN 4.0 09/24/2015    Electrolytes Lab Results  Component Value Date   NA 140 09/24/2015   K 4.3 09/24/2015   CL 107 09/24/2015   CALCIUM 10.0 09/24/2015   MG 2.2 09/24/2015    Allergies  Kristin [name] is allergic to cyclobenzaprine; flexeril; and vancomycin.  Meds  The patient has a current medication list which includes the following prescription(s): albuterol, aspirin, bisacodyl, calcium citrate-vitamin d, vitamin d3, furosemide, levocetirizine, lisinopril, magnesium oxide, melatonin, omeprazole, oxycodone-acetaminophen, ropinirole, triamcinolone cream, benefiber, oxycodone-acetaminophen, and oxycodone-acetaminophen.  Current Outpatient Prescriptions on File Prior to Visit  Medication Sig  . albuterol (PROVENTIL) (2.5 MG/3ML) 0.083% nebulizer solution Take 3 mLs (2.5 mg total) by nebulization every 8 (eight) hours as needed. Shortness of breath and wheezing  . aspirin 325 MG tablet Take 325 mg by mouth daily.  . calcium citrate-vitamin D (CITRACAL+D) 315-200 MG-UNIT per tablet Take 1 tablet by mouth 2 (two) times daily.  . furosemide (LASIX) 20 MG tablet Take 1 tablet (20 mg total) by mouth daily.  Marland Kitchen levocetirizine (XYZAL) 5 MG tablet Take 1 tablet (5  mg total) by mouth every evening.  Marland Kitchen lisinopril (PRINIVIL,ZESTRIL) 10 MG tablet Take 10 mg by mouth daily.  . Melatonin 5 MG CAPS Take 2 capsules by mouth at bedtime.   Marland Kitchen omeprazole (PRILOSEC) 20 MG capsule Take 20 mg by mouth daily.  Marland Kitchen triamcinolone cream (KENALOG) 0.1 % Apply 1 application topically 2 (two) times daily.   No current facility-administered medications on file prior to visit.    ROS  Constitutional: Afebrile, no chills, well hydrated and well nourished Gastrointestinal: negative Musculoskeletal:negative Neurological: negative Behavioral/Psych: negative  PFSH  Medical:  Ms. Snedeker  has a past medical history of Hypertension; Hyperlipidemia; COPD (chronic obstructive pulmonary disease) ([place]); Arthritis; Chronic back pain; CHF (congestive heart failure) ([place]); Hepatitis C; Gout; Arthritis; CKD (chronic kidney disease); OAB (overactive bladder); UTI (lower urinary tract infection); Cystocele; Vaginal atrophy; Bradycardia; Hematuria; Urinary frequency; Hypomagnesemia (06/25/2015); and Cancer ([place]) (2004). Family: family history includes Breast cancer in her paternal aunt; Breast cancer (age of onset: 44) in her sister; Cancer in her sister; Heart disease in her father and mother; Stroke in her mother. There is no history of Anesthesia problems, Hypotension, Malignant hyperthermia, or Pseudochol deficiency. Surgical:  has past surgical history that includes Abdominal hysterectomy; Joint replacement; Back surgery; Rotator cuff repair; Breast surgery; Cataract extraction w/PHACO (10/19/2011); Cataract extraction w/PHACO (11/02/2011); and Carpal tunnel release. Tobacco:  reports that she has quit smoking. Her smoking use included Cigarettes. She has a 50 pack-year smoking history. She does not have any smokeless tobacco history on file. Alcohol:  reports that she does not drink alcohol. Drug:  reports that she does not use illicit drugs.  Physical Exam  Vitals:  Today's Vitals    09/24/15 1357  BP: 141/76  Pulse: 70  Temp: 98.8 F (37.1 C)  TempSrc: Oral  Resp: 16  Height: '5\' 9"'$  (1.753 m)  Weight: 178 lb (80.74 kg)  SpO2: 97%  PainSc: 7     Calculated BMI: Body mass index is 26.27 kg/(m^2).  General appearance: alert, cooperative, appears stated age,  no distress and mildly obese Eyes: PERLA Respiratory: No evidence respiratory distress, no audible rales or ronchi and no use of accessory muscles of respiration  Cervical Spine Inspection: Normal anatomy Alignment: Symetrical ROM: Adequate  Upper Extremities Inspection: No gross anomalies detected ROM: Adequate Sensory: Normal Motor: Unremarkable  Thoracic Spine Inspection: No gross anomalies detected Alignment: Symetrical ROM: Adequate Palpation: WNL  Lumbar Spine Inspection: No gross anomalies detected Alignment: Symetrical ROM: Decreased Palpation: Tender Provocative Tests:  Lumbar Hyperextension and rotation test:  Positive bilaterally Patrick's Maneuver: deferred Gait: Antalgic (limping)  Lower Extremities Inspection: No gross anomalies detected ROM: Adequate Sensory:  Normal Motor: Unremarkable  Toe walk (S1): WNL  Heal walk (L5): WNL  Assessment & Plan  Primary Diagnosis & Pertinent Problem List: The primary encounter diagnosis was Chronic pain. Diagnoses of Chronic low back pain (L>R), Encounter for therapeutic drug level monitoring, Long term current use of opiate analgesic, Therapeutic opioid-induced constipation (OIC), Chronic constipation, Restless leg syndrome, Vitamin D insufficiency, and Lumbar facet syndrome (Bilateral) (L>R) were also pertinent to this visit.  Visit Diagnosis: 1. Chronic pain   2. Chronic low back pain (L>R)   3. Encounter for therapeutic drug level monitoring   4. Long term current use of opiate analgesic   5. Therapeutic opioid-induced constipation (OIC)   6. Chronic constipation   7. Restless leg syndrome   8. Vitamin D insufficiency   9.  Lumbar facet syndrome (Bilateral) (L>R)     Assessment: No problem-specific assessment & plan notes found for this encounter.   Plan of Care  Pharmacotherapy (Medications Ordered): Meds ordered this encounter  Medications  . DISCONTD: bisacodyl (DULCOLAX) 5 MG EC tablet    Sig: Take 2 tablets (10 mg total) by mouth daily as needed for moderate constipation.    Dispense:  30 tablet    Refill:  PRN    Do not place this medication, or any other prescription from our practice, on "Automatic Refill". Patient may have prescription filled one day early if pharmacy is closed on scheduled refill date.  . magnesium oxide (MAG-OX) 400 MG tablet    Sig: Take 1 tablet (400 mg total) by mouth daily.    Dispense:  30 tablet    Refill:  2    Do not place this medication, or any other prescription from our practice, on "Automatic Refill". Patient may have prescription filled one day early if pharmacy is closed on scheduled refill date.  . Wheat Dextrin (BENEFIBER) POWD    Sig: Stir 2 teaspoons of Benefiber into 4-8 oz of any non-carbonated beverage or soft food (hot or cold) PO TID    Dispense:  500 g    Refill:  PRN    Do not place this medication, or any other prescription from our practice, on "Automatic Refill". Patient may have prescription filled one day early if pharmacy is closed on scheduled refill date.  Marland Kitchen oxyCODONE-acetaminophen (PERCOCET) 10-325 MG tablet    Sig: Take 2 tablets by mouth every 8 (eight) hours as needed for pain.    Dispense:  180 tablet    Refill:  0    Do not place this medication, or any other prescription from our practice, on "Automatic Refill". Patient may have prescription filled one day early if pharmacy is closed on scheduled refill date. Do not fill until: 09/24/15 To last until: 10/24/15  . oxyCODONE-acetaminophen (PERCOCET) 10-325 MG tablet    Sig: Take 2 tablets by mouth every 8 (eight) hours as needed for pain.  Dispense:  180 tablet    Refill:  0    Do  not place this medication, or any other prescription from our practice, on "Automatic Refill". Patient may have prescription filled one day early if pharmacy is closed on scheduled refill date. Do not fill until: 10/24/15 To last until: 11/23/15  . oxyCODONE-acetaminophen (PERCOCET) 10-325 MG tablet    Sig: Take 2 tablets by mouth every 8 (eight) hours as needed for pain.    Dispense:  120 tablet    Refill:  0    Do not place this medication, or any other prescription from our practice, on "Automatic Refill". Patient may have prescription filled one day early if pharmacy is closed on scheduled refill date. Do not fill until: 11/23/15 To last until: 12/23/15  . rOPINIRole (REQUIP) 2 MG tablet    Sig: Take 1 tablet (2 mg total) by mouth at bedtime.    Dispense:  30 tablet    Refill:  2    Do not place this medication, or any other prescription from our practice, on "Automatic Refill". Patient may have prescription filled one day early if pharmacy is closed on scheduled refill date.  . Cholecalciferol (VITAMIN D3) 2000 units TABS    Sig: Take 1 tablet by mouth daily.    Dispense:  30 tablet    Refill:  2    Do not place this medication, or any other prescription from our practice, on "Automatic Refill". Patient may have prescription filled one day early if pharmacy is closed on scheduled refill date.  . bisacodyl (DULCOLAX) 5 MG EC tablet    Sig: Take 2 tablets (10 mg total) by mouth daily as needed for moderate constipation.    Dispense:  60 tablet    Refill:  PRN    Do not place this medication, or any other prescription from our practice, on "Automatic Refill". Patient may have prescription filled one day early if pharmacy is closed on scheduled refill date.    Lab-work & Procedure Ordered: Orders Placed This Encounter  Procedures  . LUMBAR FACET(MEDIAL BRANCH NERVE BLOCK) MBNB    Standing Status: Standing     Number of Occurrences: 1     Standing Expiration Date: 09/23/2016     Scheduling Instructions:     Side: Bilateral     Level: L2, L3, L4, L5, & S1 Medial Branch Nerve     Sedation: With Sedation.     Timeframe: PRN Procedure. Patient will call to schedule.    Order Specific Question:  Where will this procedure be performed?    Answer:  [facility]  . Drugs of abuse screen w/o alc, rtn urine-sln    Volume: 10 ml(s). Minimum 3 ml of urine is needed. Document temperature of fresh sample. Indications: Long term (current) use of opiate analgesic (Z79.891) Test#: [id number] ([employer] Select-13)  . Comprehensive metabolic panel    Order Specific Question:  Has the patient fasted?    Answer:  No  . C-reactive protein  . Magnesium  . Sedimentation rate  . Vitamin B12    Indication: Bone Pain (M89.9)  . Vitamin D pnl(25-hydrxy+1,25-dihy)-bld    Imaging Ordered: None  Interventional Therapies: Scheduled: None at this time. PRN Procedures: Bilateral lumbar facet block under fluoroscopic guidance and IV sedation.    Referral(s) or Consult(s): None at this time.  Medications administered during this visit: Ms. Campillo had no medications administered during this visit.  Future Appointments Date Time Provider [place]  12/18/2015  1:00 PM Milinda Pointer, MD [facility] None  12/22/2015 11:00 AM Arlis Porta., MD [facility] None  03/03/2016 10:30 AM Roda Shutters, FNP [facility] None    Primary Care Physician: Dicky Doe, MD Location: [facility] Note by: Kathlen Brunswick. Dossie Arbour, M.D, DABA, DABAPM, DABPM, DABIPP, FIPP

## 2015-09-30 LAB — TOXASSURE SELECT 13 (MW), URINE: PDF: 0

## 2015-10-04 NOTE — Progress Notes (Signed)
Quick Note:   BUN levels between 7 to 20 mg/dL (2.5 to 7.1 mmol/L) are considered normal. Elevated blood urea nitrogen can also be due to: urinary tract obstruction; congestive heart failure or recent heart attack; gastrointestinal bleeding; dehydration; shock; severe burns; certain medications, such as corticosteroids and some antibiotics; and/or a high protein diet.  Normal Creatinine levels are between 0.5 and 0.9 mg/dl for our lab. Any condition that impairs the function of the kidneys is likely to raise the creatinine level in the blood. The most common causes of longstanding (chronic) kidney disease in adults are high blood pressure and diabetes. Other causes of elevated blood creatinine levels include drugs, ingestion of a large amount of dietary meat, kidney infections, rhabdomyolysis (abnormal muscle breakdown), and urinary tract obstruction.  BUN-to-creatinine ratio >20:1 (BUN dispropertionally higher than the creatinine levels) suggests prerenal azotemia (dehydration or renal hypoperfusion), while <10:1 levels suggest renal damage.  eGFR (Estimated Glomerular Filtration Rate) results are reported as milliliters/minute/1.52m (mL/min/1.766m. Because some laboratories do not collect information on a patient's race when the sample is collected for testing, they may report calculated results for both African Americans and non-African Americans.  The NaNationwide Mutual InsuranceNAcad[facility] Surgical Suitesuggests only reporting actual results once values are < 60 mL/min. 1. Normal values: 90-120 mL/min 2. Below 60 mL/min suggests that some kidney damage has occurred. 3. Between 5937nd 30 indicate (Moderate) Stage 3 kidney disease. 4. Between 29 and 15 represent (Severe) Stage 4 kidney disease. 5. Less than 15 is considered (Kidney Failure) Stage 5.  ______

## 2015-10-04 NOTE — Progress Notes (Signed)
Quick Note:  Lab results reviewed and found to be within normal limits. ______ 

## 2015-10-12 DIAGNOSIS — J45998 Other asthma: Secondary | ICD-10-CM | POA: Diagnosis not present

## 2015-10-28 ENCOUNTER — Inpatient Hospital Stay
Admission: EM | Admit: 2015-10-28 | Discharge: 2015-10-30 | DRG: 690 | Disposition: A | Discharge status: Home / Self Care | Payer: Medicare Other | Attending: Internal Medicine | Admitting: Internal Medicine

## 2015-10-28 DIAGNOSIS — K219 Gastro-esophageal reflux disease without esophagitis: Secondary | ICD-10-CM | POA: Diagnosis not present

## 2015-10-28 DIAGNOSIS — Z79891 Long term (current) use of opiate analgesic: Secondary | ICD-10-CM | POA: Diagnosis not present

## 2015-10-28 DIAGNOSIS — E785 Hyperlipidemia, unspecified: Secondary | ICD-10-CM | POA: Diagnosis not present

## 2015-10-28 DIAGNOSIS — I509 Heart failure, unspecified: Secondary | ICD-10-CM | POA: Diagnosis not present

## 2015-10-28 DIAGNOSIS — M549 Dorsalgia, unspecified: Secondary | ICD-10-CM | POA: Diagnosis present

## 2015-10-28 DIAGNOSIS — R42 Dizziness and giddiness: Secondary | ICD-10-CM | POA: Diagnosis not present

## 2015-10-28 DIAGNOSIS — Z7951 Long term (current) use of inhaled steroids: Secondary | ICD-10-CM

## 2015-10-28 DIAGNOSIS — N3000 Acute cystitis without hematuria: Secondary | ICD-10-CM | POA: Diagnosis not present

## 2015-10-28 DIAGNOSIS — Z7982 Long term (current) use of aspirin: Secondary | ICD-10-CM

## 2015-10-28 DIAGNOSIS — N3281 Overactive bladder: Secondary | ICD-10-CM | POA: Diagnosis present

## 2015-10-28 DIAGNOSIS — Z853 Personal history of malignant neoplasm of breast: Secondary | ICD-10-CM

## 2015-10-28 DIAGNOSIS — E860 Dehydration: Secondary | ICD-10-CM | POA: Diagnosis not present

## 2015-10-28 DIAGNOSIS — N183 Chronic kidney disease, stage 3 (moderate): Secondary | ICD-10-CM | POA: Diagnosis present

## 2015-10-28 DIAGNOSIS — Z87891 Personal history of nicotine dependence: Secondary | ICD-10-CM

## 2015-10-28 DIAGNOSIS — R531 Weakness: Secondary | ICD-10-CM | POA: Diagnosis not present

## 2015-10-28 DIAGNOSIS — B1920 Unspecified viral hepatitis C without hepatic coma: Secondary | ICD-10-CM | POA: Diagnosis present

## 2015-10-28 DIAGNOSIS — Z881 Allergy status to other antibiotic agents status: Secondary | ICD-10-CM

## 2015-10-28 DIAGNOSIS — N390 Urinary tract infection, site not specified: Secondary | ICD-10-CM

## 2015-10-28 DIAGNOSIS — R55 Syncope and collapse: Secondary | ICD-10-CM | POA: Diagnosis not present

## 2015-10-28 DIAGNOSIS — M109 Gout, unspecified: Secondary | ICD-10-CM | POA: Diagnosis not present

## 2015-10-28 DIAGNOSIS — Z888 Allergy status to other drugs, medicaments and biological substances status: Secondary | ICD-10-CM | POA: Diagnosis not present

## 2015-10-28 DIAGNOSIS — Z885 Allergy status to narcotic agent status: Secondary | ICD-10-CM | POA: Diagnosis not present

## 2015-10-28 DIAGNOSIS — E1122 Type 2 diabetes mellitus with diabetic chronic kidney disease: Secondary | ICD-10-CM | POA: Diagnosis present

## 2015-10-28 DIAGNOSIS — J449 Chronic obstructive pulmonary disease, unspecified: Secondary | ICD-10-CM | POA: Diagnosis present

## 2015-10-28 DIAGNOSIS — G2581 Restless legs syndrome: Secondary | ICD-10-CM | POA: Diagnosis present

## 2015-10-28 DIAGNOSIS — I480 Paroxysmal atrial fibrillation: Secondary | ICD-10-CM | POA: Diagnosis not present

## 2015-10-28 DIAGNOSIS — I4892 Unspecified atrial flutter: Secondary | ICD-10-CM | POA: Diagnosis present

## 2015-10-28 DIAGNOSIS — Z79899 Other long term (current) drug therapy: Secondary | ICD-10-CM

## 2015-10-28 DIAGNOSIS — M1990 Unspecified osteoarthritis, unspecified site: Secondary | ICD-10-CM | POA: Diagnosis not present

## 2015-10-28 DIAGNOSIS — I130 Hypertensive heart and chronic kidney disease with heart failure and stage 1 through stage 4 chronic kidney disease, or unspecified chronic kidney disease: Secondary | ICD-10-CM | POA: Diagnosis not present

## 2015-10-28 DIAGNOSIS — Z96653 Presence of artificial knee joint, bilateral: Secondary | ICD-10-CM | POA: Diagnosis not present

## 2015-10-28 DIAGNOSIS — R0602 Shortness of breath: Secondary | ICD-10-CM | POA: Diagnosis not present

## 2015-10-28 DIAGNOSIS — I4891 Unspecified atrial fibrillation: Secondary | ICD-10-CM | POA: Diagnosis present

## 2015-10-28 DIAGNOSIS — I10 Essential (primary) hypertension: Secondary | ICD-10-CM | POA: Diagnosis not present

## 2015-10-28 DIAGNOSIS — G8929 Other chronic pain: Secondary | ICD-10-CM | POA: Diagnosis not present

## 2015-10-28 LAB — URINALYSIS COMPLETE WITH MICROSCOPIC (ARMC ONLY)
Bilirubin Urine: NEGATIVE
GLUCOSE, UA: NEGATIVE mg/dL
HGB URINE DIPSTICK: NEGATIVE
Ketones, ur: NEGATIVE mg/dL
Nitrite: NEGATIVE
PH: 5 (ref 5.0–8.0)
PROTEIN: NEGATIVE mg/dL
Specific Gravity, Urine: 1.013 (ref 1.005–1.030)

## 2015-10-28 LAB — CBC
HEMATOCRIT: 42.5 % (ref 35.0–47.0)
Hemoglobin: 14.2 g/dL (ref 12.0–16.0)
MCH: 30.4 pg (ref 26.0–34.0)
MCHC: 33.4 g/dL (ref 32.0–36.0)
MCV: 91 fL (ref 80.0–100.0)
Platelets: 175 10*3/uL (ref 150–440)
RBC: 4.67 MIL/uL (ref 3.80–5.20)
RDW: 15.2 % — ABNORMAL HIGH (ref 11.5–14.5)
WBC: 7.2 10*3/uL (ref 3.6–11.0)

## 2015-10-28 NOTE — ED Notes (Signed)
Patient ambulatory to triage with steady gait, without difficulty or distress noted; pt reports sudden onset of diaphoresis and dizziness PTA: st symptoms resolved now; st occurrence Friday as well; denies any recent illness; denies any pain

## 2015-10-29 ENCOUNTER — Inpatient Hospital Stay
Admit: 2015-10-29 | Discharge: 2015-10-29 | Disposition: A | Discharge status: Home / Self Care | Payer: Medicare Other | Attending: Internal Medicine | Admitting: Internal Medicine

## 2015-10-29 ENCOUNTER — Emergency Department: Payer: Medicare Other

## 2015-10-29 ENCOUNTER — Encounter: Payer: Self-pay | Admitting: Internal Medicine

## 2015-10-29 DIAGNOSIS — Z888 Allergy status to other drugs, medicaments and biological substances status: Secondary | ICD-10-CM | POA: Diagnosis not present

## 2015-10-29 DIAGNOSIS — I130 Hypertensive heart and chronic kidney disease with heart failure and stage 1 through stage 4 chronic kidney disease, or unspecified chronic kidney disease: Secondary | ICD-10-CM | POA: Diagnosis present

## 2015-10-29 DIAGNOSIS — I4891 Unspecified atrial fibrillation: Secondary | ICD-10-CM | POA: Diagnosis present

## 2015-10-29 DIAGNOSIS — R55 Syncope and collapse: Secondary | ICD-10-CM | POA: Diagnosis present

## 2015-10-29 DIAGNOSIS — N183 Chronic kidney disease, stage 3 (moderate): Secondary | ICD-10-CM | POA: Diagnosis present

## 2015-10-29 DIAGNOSIS — Z885 Allergy status to narcotic agent status: Secondary | ICD-10-CM | POA: Diagnosis not present

## 2015-10-29 DIAGNOSIS — E1122 Type 2 diabetes mellitus with diabetic chronic kidney disease: Secondary | ICD-10-CM | POA: Diagnosis present

## 2015-10-29 DIAGNOSIS — M549 Dorsalgia, unspecified: Secondary | ICD-10-CM | POA: Diagnosis present

## 2015-10-29 DIAGNOSIS — G2581 Restless legs syndrome: Secondary | ICD-10-CM | POA: Diagnosis present

## 2015-10-29 DIAGNOSIS — I4892 Unspecified atrial flutter: Secondary | ICD-10-CM | POA: Diagnosis present

## 2015-10-29 DIAGNOSIS — Z79891 Long term (current) use of opiate analgesic: Secondary | ICD-10-CM | POA: Diagnosis not present

## 2015-10-29 DIAGNOSIS — Z853 Personal history of malignant neoplasm of breast: Secondary | ICD-10-CM | POA: Diagnosis not present

## 2015-10-29 DIAGNOSIS — Z96653 Presence of artificial knee joint, bilateral: Secondary | ICD-10-CM | POA: Diagnosis present

## 2015-10-29 DIAGNOSIS — M1990 Unspecified osteoarthritis, unspecified site: Secondary | ICD-10-CM | POA: Diagnosis present

## 2015-10-29 DIAGNOSIS — Z7951 Long term (current) use of inhaled steroids: Secondary | ICD-10-CM | POA: Diagnosis not present

## 2015-10-29 DIAGNOSIS — K219 Gastro-esophageal reflux disease without esophagitis: Secondary | ICD-10-CM | POA: Diagnosis present

## 2015-10-29 DIAGNOSIS — Z87891 Personal history of nicotine dependence: Secondary | ICD-10-CM | POA: Diagnosis not present

## 2015-10-29 DIAGNOSIS — I509 Heart failure, unspecified: Secondary | ICD-10-CM | POA: Diagnosis present

## 2015-10-29 DIAGNOSIS — M109 Gout, unspecified: Secondary | ICD-10-CM | POA: Diagnosis present

## 2015-10-29 DIAGNOSIS — Z79899 Other long term (current) drug therapy: Secondary | ICD-10-CM | POA: Diagnosis not present

## 2015-10-29 DIAGNOSIS — B1920 Unspecified viral hepatitis C without hepatic coma: Secondary | ICD-10-CM | POA: Diagnosis present

## 2015-10-29 DIAGNOSIS — N3000 Acute cystitis without hematuria: Secondary | ICD-10-CM | POA: Diagnosis present

## 2015-10-29 DIAGNOSIS — E785 Hyperlipidemia, unspecified: Secondary | ICD-10-CM | POA: Diagnosis present

## 2015-10-29 DIAGNOSIS — Z7982 Long term (current) use of aspirin: Secondary | ICD-10-CM | POA: Diagnosis not present

## 2015-10-29 DIAGNOSIS — G8929 Other chronic pain: Secondary | ICD-10-CM | POA: Diagnosis present

## 2015-10-29 DIAGNOSIS — Z881 Allergy status to other antibiotic agents status: Secondary | ICD-10-CM | POA: Diagnosis not present

## 2015-10-29 DIAGNOSIS — N3281 Overactive bladder: Secondary | ICD-10-CM | POA: Diagnosis present

## 2015-10-29 DIAGNOSIS — E860 Dehydration: Secondary | ICD-10-CM | POA: Diagnosis present

## 2015-10-29 DIAGNOSIS — J449 Chronic obstructive pulmonary disease, unspecified: Secondary | ICD-10-CM | POA: Diagnosis present

## 2015-10-29 LAB — BASIC METABOLIC PANEL
Anion gap: 7 (ref 5–15)
Anion gap: 9 (ref 5–15)
BUN: 27 mg/dL — AB (ref 6–20)
BUN: 27 mg/dL — AB (ref 6–20)
CO2: 26 mmol/L (ref 22–32)
CO2: 21 mmol/L — ABNORMAL LOW (ref 22–32)
CREATININE: 1.51 mg/dL — AB (ref 0.44–1.00)
CREATININE: 1.25 mg/dL — AB (ref 0.44–1.00)
Calcium: 10 mg/dL (ref 8.9–10.3)
Calcium: 9.3 mg/dL (ref 8.9–10.3)
Chloride: 109 mmol/L (ref 101–111)
Chloride: 111 mmol/L (ref 101–111)
GFR calc Af Amer: 35 mL/min — ABNORMAL LOW (ref >60)
GFR calc Af Amer: 44 mL/min — ABNORMAL LOW (ref >60)
GFR, EST NON AFRICAN AMERICAN: 31 mL/min — AB (ref >60)
GFR, EST NON AFRICAN AMERICAN: 38 mL/min — AB (ref >60)
Glucose, Bld: 113 mg/dL — ABNORMAL HIGH (ref 65–99)
Glucose, Bld: 88 mg/dL (ref 65–99)
POTASSIUM: 5 mmol/L (ref 3.5–5.1)
Potassium: 4.4 mmol/L (ref 3.5–5.1)
SODIUM: 142 mmol/L (ref 135–145)
SODIUM: 141 mmol/L (ref 135–145)

## 2015-10-29 LAB — GLUCOSE, CAPILLARY
GLUCOSE-CAPILLARY: 81 mg/dL (ref 65–99)
GLUCOSE-CAPILLARY: 88 mg/dL (ref 65–99)

## 2015-10-29 LAB — TROPONIN I
TROPONIN I: 0.03 ng/mL (ref <0.031)
Troponin I: <0.03 ng/mL (ref <0.031)
Troponin I: <0.03 ng/mL (ref <0.031)

## 2015-10-29 LAB — TSH: TSH: 2.033 u[IU]/mL (ref 0.350–4.500)

## 2015-10-29 MED ORDER — PANTOPRAZOLE SODIUM 40 MG PO TBEC
40.0000 mg | DELAYED_RELEASE_TABLET | Freq: Every day | ORAL | Status: DC
  Administered 2015-10-29 – 2015-10-30 (×2): 40 mg via ORAL
  Filled 2015-10-29 (×2): qty 1

## 2015-10-29 MED ORDER — DEXTROSE 5 % IV SOLN
1.0000 g | INTRAVENOUS | Status: DC
  Administered 2015-10-29: 1 g via INTRAVENOUS
  Filled 2015-10-29 (×2): qty 10

## 2015-10-29 MED ORDER — APIXABAN 5 MG PO TABS
5.0000 mg | ORAL_TABLET | Freq: Two times a day (BID) | ORAL | Status: DC
  Administered 2015-10-29 – 2015-10-30 (×3): 5 mg via ORAL
  Filled 2015-10-29 (×3): qty 1

## 2015-10-29 MED ORDER — OXYCODONE-ACETAMINOPHEN 10-325 MG PO TABS: 1.0000 | ORAL_TABLET | Freq: Three times a day (TID) | ORAL | Status: DC | PRN

## 2015-10-29 MED ORDER — ACETAMINOPHEN 325 MG PO TABS: 650.0000 mg | ORAL_TABLET | Freq: Four times a day (QID) | ORAL | Status: DC | PRN

## 2015-10-29 MED ORDER — BISACODYL 5 MG PO TBEC: 10.0000 mg | DELAYED_RELEASE_TABLET | Freq: Every day | ORAL | Status: DC | PRN

## 2015-10-29 MED ORDER — CALCIUM CITRATE-VITAMIN D 315-200 MG-UNIT PO TABS: 1.0000 | ORAL_TABLET | Freq: Two times a day (BID) | ORAL | Status: DC

## 2015-10-29 MED ORDER — MAGNESIUM SULFATE 2 GM/50ML IV SOLN
2.0000 g | Freq: Once | INTRAVENOUS | Status: AC
  Administered 2015-10-29: 2 g via INTRAVENOUS
  Filled 2015-10-29: qty 50

## 2015-10-29 MED ORDER — DEXTROSE 5 % IV SOLN
1.0000 g | INTRAVENOUS | Status: DC
  Administered 2015-10-29: 1 g via INTRAVENOUS
  Filled 2015-10-29: qty 10

## 2015-10-29 MED ORDER — ALBUTEROL SULFATE (2.5 MG/3ML) 0.083% IN NEBU: 2.5000 mg | INHALATION_SOLUTION | Freq: Four times a day (QID) | RESPIRATORY_TRACT | Status: DC | PRN

## 2015-10-29 MED ORDER — PSYLLIUM 95 % PO PACK
1.0000 | PACK | Freq: Every day | ORAL | Status: DC
  Administered 2015-10-29 – 2015-10-30 (×2): 1 via ORAL
  Filled 2015-10-29 (×2): qty 1

## 2015-10-29 MED ORDER — LISINOPRIL 10 MG PO TABS
10.0000 mg | ORAL_TABLET | Freq: Every day | ORAL | Status: DC
  Administered 2015-10-29: 10 mg via ORAL
  Filled 2015-10-29 (×2): qty 1

## 2015-10-29 MED ORDER — VITAMIN D 1000 UNITS PO TABS
2000.0000 [IU] | ORAL_TABLET | Freq: Every day | ORAL | Status: DC
  Administered 2015-10-29 – 2015-10-30 (×2): 2000 [IU] via ORAL
  Filled 2015-10-29 (×2): qty 2

## 2015-10-29 MED ORDER — ENOXAPARIN SODIUM 80 MG/0.8ML ~~LOC~~ SOLN
1.0000 mg/kg | SUBCUTANEOUS | Status: DC
  Administered 2015-10-29: 80 mg via SUBCUTANEOUS
  Filled 2015-10-29: qty 0.8

## 2015-10-29 MED ORDER — SODIUM CHLORIDE 0.9% FLUSH
3.0000 mL | Freq: Two times a day (BID) | INTRAVENOUS | Status: DC
Start: 2015-10-29 — End: 2015-10-30
  Administered 2015-10-29 (×2): 3 mL via INTRAVENOUS

## 2015-10-29 MED ORDER — MAGNESIUM OXIDE 400 (241.3 MG) MG PO TABS
400.0000 mg | ORAL_TABLET | Freq: Every day | ORAL | Status: DC
  Administered 2015-10-29 – 2015-10-30 (×2): 400 mg via ORAL
  Filled 2015-10-29 (×2): qty 1

## 2015-10-29 MED ORDER — SODIUM CHLORIDE 0.9 % IV BOLUS (SEPSIS)
500.0000 mL | Freq: Once | INTRAVENOUS | Status: AC
  Administered 2015-10-29: 500 mL via INTRAVENOUS

## 2015-10-29 MED ORDER — SENNOSIDES-DOCUSATE SODIUM 8.6-50 MG PO TABS: 1.0000 | ORAL_TABLET | Freq: Every evening | ORAL | Status: DC | PRN

## 2015-10-29 MED ORDER — ASPIRIN 325 MG PO TABS
325.0000 mg | ORAL_TABLET | Freq: Every day | ORAL | Status: DC
  Administered 2015-10-29: 325 mg via ORAL
  Filled 2015-10-29 (×2): qty 1

## 2015-10-29 MED ORDER — OXYCODONE-ACETAMINOPHEN 5-325 MG PO TABS: 1.0000 | ORAL_TABLET | Freq: Three times a day (TID) | ORAL | Status: DC | PRN

## 2015-10-29 MED ORDER — DEXTROSE 5 % IV SOLN
2.0000 g | INTRAVENOUS | Status: DC
  Filled 2015-10-29: qty 2

## 2015-10-29 MED ORDER — OXYCODONE HCL 5 MG PO TABS: 5.0000 mg | ORAL_TABLET | Freq: Three times a day (TID) | ORAL | Status: DC | PRN

## 2015-10-29 MED ORDER — TRIAMCINOLONE ACETONIDE 0.1 % EX CREA
1.0000 "application " | TOPICAL_CREAM | Freq: Two times a day (BID) | CUTANEOUS | Status: DC
  Filled 2015-10-29 (×2): qty 15

## 2015-10-29 MED ORDER — ACETAMINOPHEN 650 MG RE SUPP: 650.0000 mg | Freq: Four times a day (QID) | RECTAL | Status: DC | PRN

## 2015-10-29 MED ORDER — ROPINIROLE HCL 1 MG PO TABS
2.0000 mg | ORAL_TABLET | Freq: Every day | ORAL | Status: DC
  Administered 2015-10-29: 2 mg via ORAL
  Filled 2015-10-29 (×3): qty 2

## 2015-10-29 MED ORDER — MELATONIN 5 MG PO CAPS: 2.0000 | ORAL_CAPSULE | Freq: Every day | ORAL | Status: DC

## 2015-10-29 MED ORDER — SODIUM CHLORIDE 0.9 % IV SOLN
INTRAVENOUS | Status: DC
  Administered 2015-10-29: 03:00:00 via INTRAVENOUS

## 2015-10-29 MED ORDER — CALCIUM CARBONATE-VITAMIN D 500-200 MG-UNIT PO TABS
1.0000 | ORAL_TABLET | Freq: Two times a day (BID) | ORAL | Status: DC
  Administered 2015-10-29 – 2015-10-30 (×3): 1 via ORAL
  Filled 2015-10-29 (×3): qty 1

## 2015-10-29 MED ORDER — ASPIRIN EC 81 MG PO TBEC: 81.0000 mg | DELAYED_RELEASE_TABLET | Freq: Every day | ORAL | Status: DC

## 2015-10-29 NOTE — ED Notes (Signed)
Patient transported to X-ray and CT via stretcher.

## 2015-10-29 NOTE — ED Provider Notes (Signed)
[facility] Emergency Department Provider Note  ____________________________________________  Time seen: Approximately 12:18 AM  I have reviewed the triage vital signs and the nursing notes.   HISTORY  Chief Complaint Dizziness    HPI JONNETTE NUON is a 82 y.o. female who presents to the ED from home with a chief complaint of dizziness and sweating. Patient reports similar episode last week. This evening she was at rest sitting in a chair when she experienced lightheadedness with sweating episode. Symptoms associated with nausea. Denies associated fever, chills, headache, vision changes, neck pain, chest pain, shortness of breath, vomiting, diarrhea. Denies recent travel or trauma. Nothing makes her symptoms better or worse.   Past Medical History  Diagnosis Date  . Hypertension   . Hyperlipidemia   . COPD (chronic obstructive pulmonary disease) (HCC)     wears O2 at 2L via [place] at night  . Arthritis   . Chronic back pain   . CHF (congestive heart failure) ([place])   . Hepatitis C   . Gout   . Arthritis   . CKD (chronic kidney disease)   . OAB (overactive bladder)   . UTI (lower urinary tract infection)   . Cystocele   . Vaginal atrophy   . Bradycardia   . Hematuria   . Urinary frequency   . Hypomagnesemia 06/25/2015  . Cancer [facility]) 2004    rt breast cancer-post lumpectomy- chemo/rad    Patient Active Problem List   Diagnosis Date Noted  . Chronic pain 06/25/2015  . Chronic pain syndrome 06/25/2015  . Chronic low back pain (Location of Primary Source of Pain) (Bilateral) (L>R) 06/25/2015  . Lumbar facet syndrome (Bilateral) (L>R) 06/25/2015  . Lumbar spondylosis 06/25/2015  . Chronic lumbar radicular pain (Bilateral) (L>R) (L5 Dermatome) 06/25/2015  . Restless leg syndrome 06/25/2015  . Myofascial pain 06/25/2015  . Neuropathic pain 06/25/2015  . Neurogenic pain 06/25/2015  . Long term current use of opiate analgesic 06/25/2015  . Long term  prescription opiate use 06/25/2015  . Opiate use 06/25/2015  . Opiate dependence ([place]) 06/25/2015  . Encounter for therapeutic drug level monitoring 06/25/2015  . Chronic constipation 06/25/2015  . Therapeutic opioid-induced constipation (OIC) 06/25/2015  . Vitamin D insufficiency 06/25/2015  . Grade 1 Retrolisthesis of L1 over L2 06/25/2015  . Lumbar facet hypertrophy (L1-2, L2-3, and L4-5) 06/25/2015  . Stenosis of lateral recess of lumbar spine (L1-2 and L4-5) 06/25/2015  . 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 Epidural fibrosis 06/25/2015  . Lumbar postlaminectomy syndrome 06/25/2015  . Chronic hip pain (bilateral) (L>R) 06/25/2015  . 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) 06/25/2015  . 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) 06/25/2015  . Hypomagnesemia 06/25/2015    Class: History of  . Osteoporosis 06/25/2015  . Chronic sacroiliac joint pain (Left) 06/25/2015  . MRSA (methicillin resistant staph aureus) culture positive 06/25/2015  . Mitral valve prolapse 06/25/2015  . 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 Need for influenza vaccination 06/17/2015  . Frequency of urination 06/17/2015  . Seasonal allergies 06/17/2015  . Breast cancer, right breast ([place]) 06/17/2015  . Urinary frequency 04/11/2015  . BP (high blood pressure) 03/03/2015  . Upper respiratory infection, viral 01/02/2015  . Chronic diastolic heart failure ([place]) 12/26/2014  . Abnormal glucose tolerance test 09/16/2014  . CAFL (chronic airflow limitation) ([place]) 09/16/2014  . Essential (primary) hypertension 09/16/2014  . Chronic obstructive pulmonary disease ([place]) 09/16/2014  . H/O cardiac catheterization 04/04/2014  .  History of cardiac catheterization 04/04/2014  . Complete rotator cuff rupture of shoulder (Left) 04/03/2014  . CCF (congestive cardiac failure) ([place]) 04/03/2014  . DDD (degenerative disc disease), lumbosacral 04/03/2014  . Benign  essential HTN 04/03/2014  . Arrhythmia, sinus node ([place]) 04/03/2014  . Lumbar canal stenosis 04/03/2014  . Congestive heart failure ([place]) 04/03/2014  . Degeneration of intervertebral disc of lumbosacral region 04/03/2014  . Sinoatrial node dysfunction ([place]) 04/03/2014  . Chronic kidney disease (CKD), stage III (moderate) 10/30/2013  . Gout 10/30/2013    Past Surgical History  Procedure Laterality Date  . Abdominal hysterectomy    . Joint replacement      bilateral TKA  . Back surgery    . Rotator cuff repair      bilateral  . Breast surgery      rt lumpectomy  . Cataract extraction w/phaco  10/19/2011    Procedure: CATARACT EXTRACTION PHACO AND INTRAOCULAR LENS PLACEMENT (IOC);  Surgeon: Elta Guadeloupe T. Gershon Crane, MD;  Location: [facility];  Service: Ophthalmology;  Laterality: Right;  CDE:10.81  . Cataract extraction w/phaco  11/02/2011    Procedure: CATARACT EXTRACTION PHACO AND INTRAOCULAR LENS PLACEMENT (IOC);  Surgeon: Elta Guadeloupe T. Gershon Crane, MD;  Location: [facility];  Service: Ophthalmology;  Laterality: Left;  CDE 8.60  . Carpal tunnel release      Current Outpatient Rx  Name  Route  Sig  Dispense  Refill  . albuterol (PROVENTIL) (2.5 MG/3ML) 0.083% nebulizer solution   Nebulization   Take 3 mLs (2.5 mg total) by nebulization every 8 (eight) hours as needed. Shortness of breath and wheezing   75 mL   12   . aspirin 325 MG tablet   Oral   Take 325 mg by mouth daily.         . bisacodyl (DULCOLAX) 5 MG EC tablet   Oral   Take 2 tablets (10 mg total) by mouth daily as needed for moderate constipation.   60 tablet   PRN     Do not place this medication, or any other prescri ...   . calcium citrate-vitamin D (CITRACAL+D) 315-200 MG-UNIT per tablet   Oral   Take 1 tablet by mouth 2 (two) times daily.         . Cholecalciferol (VITAMIN D3) 2000 units TABS   Oral   Take 1 tablet by mouth daily.   30 tablet   2     Do not place this medication, or any other prescri ...   .  furosemide (LASIX) 20 MG tablet   Oral   Take 1 tablet (20 mg total) by mouth daily.   30 tablet   12   . levocetirizine (XYZAL) 5 MG tablet   Oral   Take 1 tablet (5 mg total) by mouth every evening.   30 tablet   12   . lisinopril (PRINIVIL,ZESTRIL) 10 MG tablet   Oral   Take 10 mg by mouth daily.         . magnesium oxide (MAG-OX) 400 MG tablet   Oral   Take 1 tablet (400 mg total) by mouth daily.   30 tablet   2     Do not place this medication, or any other prescri ...   . Melatonin 5 MG CAPS   Oral   Take 2 capsules by mouth at bedtime.          Marland Kitchen omeprazole (PRILOSEC) 20 MG capsule   Oral   Take 20 mg by mouth  daily.         . oxyCODONE-acetaminophen (PERCOCET) 10-325 MG tablet   Oral   Take 2 tablets by mouth every 8 (eight) hours as needed for pain.   180 tablet   0     Do not place this medication, or any other prescri ...   . oxyCODONE-acetaminophen (PERCOCET) 10-325 MG tablet   Oral   Take 2 tablets by mouth every 8 (eight) hours as needed for pain.   180 tablet   0     Do not place this medication, or any other prescri ...   . oxyCODONE-acetaminophen (PERCOCET) 10-325 MG tablet   Oral   Take 2 tablets by mouth every 8 (eight) hours as needed for pain.   120 tablet   0     Do not place this medication, or any other prescri ...   . rOPINIRole (REQUIP) 2 MG tablet   Oral   Take 1 tablet (2 mg total) by mouth at bedtime.   30 tablet   2     Do not place this medication, or any other prescri ...   . triamcinolone cream (KENALOG) 0.1 %   Topical   Apply 1 application topically 2 (two) times daily.   30 g   1   . Wheat Dextrin (BENEFIBER) POWD      Stir 2 teaspoons of Benefiber into 4-8 oz of any non-carbonated beverage or soft food (hot or cold) PO TID   500 g   PRN     Do not place this medication, or any other prescri ...     Allergies Cyclobenzaprine; Flexeril; and Vancomycin  Family History  Problem Relation Age of  Onset  . Anesthesia problems Neg Hx   . Hypotension Neg Hx   . Malignant hyperthermia Neg Hx   . Pseudochol deficiency Neg Hx   . Heart disease Father   . Heart disease Mother   . Stroke Mother   . Cancer Sister   . Breast cancer Sister 21  . Breast cancer Paternal Aunt     Social History Social History  Substance Use Topics  . Smoking status: Former Smoker -- 1.25 packs/day for 40 years    Types: Cigarettes  . Smokeless tobacco: Not on file  . Alcohol Use: No    Review of Systems  Constitutional: No fever/chills. Positive for sweating. Eyes: No visual changes. ENT: No sore throat. Cardiovascular: Denies chest pain. Respiratory: Denies shortness of breath. Gastrointestinal: No abdominal pain.  Positive for nausea, no vomiting.  No diarrhea.  No constipation. Genitourinary: Negative for dysuria. Musculoskeletal: Negative for back pain. Skin: Negative for rash. Neurological: Positive for dizziness and lightheadedness. Negative for headaches, focal weakness or numbness.  10-point ROS otherwise negative.  ____________________________________________   PHYSICAL EXAM:  VITAL SIGNS: ED Triage Vitals  Enc Vitals Group     BP 10/28/15 2255 105/92 mmHg     Pulse Rate 10/28/15 2255 84     Resp 10/28/15 2255 20     Temp 10/28/15 2255 97.9 F (36.6 C)     Temp Source 10/28/15 2255 Oral     SpO2 10/28/15 2255 95 %     Weight 10/28/15 2255 175 lb (79.379 kg)     Height 10/28/15 2255 '5\' 9"'$  (1.753 m)     Head Cir --      Peak Flow --      Pain Score --      Pain Loc --  Pain Edu? --      Excl. in [place]? --     Constitutional: Alert and oriented. Well appearing and in no acute distress. Eyes: Conjunctivae are normal. PERRL. EOMI. Head: Atraumatic. Nose: No congestion/rhinnorhea. Mouth/Throat: Mucous membranes are moist.  Oropharynx non-erythematous. Neck: No stridor.  No carotid bruits. Cardiovascular: Irregular rate, regular rhythm. Grossly normal heart sounds.   Good peripheral circulation. Respiratory: Normal respiratory effort.  No retractions. Lungs CTAB. Gastrointestinal: Soft and nontender. No distention. No abdominal bruits. No CVA tenderness. Musculoskeletal: No lower extremity tenderness nor edema.  No joint effusions. Neurologic:  Normal speech and language. No gross focal neurologic deficits are appreciated.  Skin:  Skin is warm, dry and intact. No rash noted. Psychiatric: Mood and affect are normal. Speech and behavior are normal.  ____________________________________________   LABS (all labs ordered are listed, but only abnormal results are displayed)  Labs Reviewed  BASIC METABOLIC PANEL - Abnormal; Notable for the following:    Glucose, Bld 113 (*)    BUN 27 (*)    Creatinine, Ser 1.51 (*)    GFR calc non Af Amer 31 (*)    GFR calc Af Amer 35 (*)    All other components within normal limits  CBC - Abnormal; Notable for the following:    RDW 15.2 (*)    All other components within normal limits  URINALYSIS COMPLETEWITH MICROSCOPIC ([facility] ONLY) - Abnormal; Notable for the following:    Color, Urine YELLOW (*)    APPearance CLEAR (*)    Leukocytes, UA 3+ (*)    Bacteria, UA RARE (*)    Squamous Epithelial / LPF 0-5 (*)    All other components within normal limits  TROPONIN I   ____________________________________________  EKG  ED ECG REPORT I, Ozie Lupe J, the attending physician, personally viewed and interpreted this ECG.   Date: 10/29/2015  EKG Time: 2307  Rate: 62  Rhythm: Atrial flutter with 4:1 AV conduction  Axis: LAD  Intervals:right bundle branch block  ST&T Change: Nonspecific New atrial flutter compared to EKG dated 09/06/2014 ____________________________________________  RADIOLOGY  Chest 2 view (view by me, interpreted per Dr. Marisue Humble): Chronic interstitial lung disease and cardiomegaly. No superimposed acute process   CT Head without contrast interpreted per Dr. Marisue Humble: Stable atrophy and  chronic small vessel ischemia, no acute intracranial abnormality. ____________________________________________   PROCEDURES  Procedure(s) performed: None  Critical Care performed: No  ____________________________________________   INITIAL IMPRESSION / ASSESSMENT AND PLAN / ED COURSE  Pertinent labs & imaging results that were available during my care of the patient were reviewed by me and considered in my medical decision making (see chart for details).  82 year old female who presents with dizziness and near syncopal episode. New onset atrial flutter and UTI found on exam. Will initiate IV antibiotics, gentle IV fluid hydration and discuss with hospitalist for admission. Records review demonstrate that patient sees Dr. Read Drivers for sinoatrial disease, specifically sinus bradycardia, and CHF; there is no documented EKG or record of patient having atrial flutter.  ____________________________________________   FINAL CLINICAL IMPRESSION(S) / ED DIAGNOSES  Final diagnoses:  New onset atrial flutter ([place])  Near syncope  UTI (lower urinary tract infection)  Weakness      Paulette Blanch, MD 10/29/15 [phone]

## 2015-10-29 NOTE — Progress Notes (Signed)
Pharmacy Antibiotic Note  Kristin [name] is a 82 y.o. female admitted on 10/28/2015 with UTI.  Pharmacy has been consulted for ceftriaxone dosing.  Plan: Ceftriaxone 2 grams q 24 hours ordered.  Height: '5\' 9"'$  (175.3 cm) Weight: 172 lb 9.6 oz (78.291 kg) IBW/kg (Calculated) : 66.2  Temp (24hrs), Avg:97.8 F (36.6 C), Min:97.7 F (36.5 C), Max:97.9 F (36.6 C)   Recent Labs Lab 10/28/15 2259  WBC 7.2  CREATININE 1.51*    Estimated Creatinine Clearance: 29 mL/min (by C-G formula based on Cr of 1.51).    Allergies  Allergen Reactions  . Cyclobenzaprine Rash    Severe Rash  . Flexeril [Cyclobenzaprine Hcl]     Severe Rash  . Vancomycin Rash    [place] Syndrome    Antimicrobials this admission:   Dose adjustments this admission:   Microbiology results:  3/8 UCx: pending   UA: LE(+) NO2(-)  WBC 6-30 CXR: no acute disease  Thank you for allowing pharmacy to be a part of this patient's care.  Terrelle Ruffolo S 10/29/2015 3:19 AM

## 2015-10-29 NOTE — Progress Notes (Signed)
PT Cancellation Note  Patient Details Name: Kristin [name] MRN: [id number] DOB: 1929/11/19   Cancelled Treatment:    Reason Eval/Treat Not Completed: Patient at procedure or test/unavailable. Patient is currently off the floor for a procedure and is not available for mobility evaluation. PT will re-attempt as patient is available.   Kerman Passey, PT, DPT    10/29/2015, 9:23 AM

## 2015-10-29 NOTE — H&P (Signed)
[facility] NAME: Kristin [name]    MR#:  [id number]  DATE OF BIRTH:  September 27, 1929  DATE OF ADMISSION:  10/28/2015  PRIMARY CARE PHYSICIAN: Dicky Doe, MD   REQUESTING/REFERRING PHYSICIAN:   CHIEF COMPLAINT:   Chief Complaint  Patient presents with  . Dizziness    HISTORY OF PRESENT ILLNESS: Kristin [name]  is a 82 y.o. female with a known history of hypertension, hyperlipidemia, COPD, arthritis, congestive heart failure, hepatitis C presented to the emergency room with dizziness and about to pass out. Patient had a similar episode of near syncope on Friday. She also had similar episode yesterday when she was watching a television sitting on the couch she felt warm and sweaty. She felt diaphoretic and about to pass out. No history of chest pain. No complaints of shortness of breath and orthopnea. No fever or chills and cough. No history of chest pain. No history of any abdominal pain nausea vomiting diarrhea. Patient was found to be in atrial flutter in the emergency room rate was under control. First set of troponin negative. Patient also complains of increased frequency of urination  PAST MEDICAL HISTORY:   Past Medical History  Diagnosis Date  . Hypertension   . Hyperlipidemia   . COPD (chronic obstructive pulmonary disease) (HCC)     wears O2 at 2L via [place] at night  . Arthritis   . Chronic back pain   . CHF (congestive heart failure) ([place])   . Hepatitis C   . Gout   . Arthritis   . CKD (chronic kidney disease)   . OAB (overactive bladder)   . UTI (lower urinary tract infection)   . Cystocele   . Vaginal atrophy   . Bradycardia   . Hematuria   . Urinary frequency   . Hypomagnesemia 06/25/2015  . Cancer [facility]) 2004    rt breast cancer-post lumpectomy- chemo/rad    PAST SURGICAL HISTORY: Past Surgical History  Procedure Laterality Date  . Abdominal hysterectomy    . Joint replacement      bilateral TKA  . Back  surgery    . Rotator cuff repair      bilateral  . Breast surgery      rt lumpectomy  . Cataract extraction w/phaco  10/19/2011    Procedure: CATARACT EXTRACTION PHACO AND INTRAOCULAR LENS PLACEMENT (IOC);  Surgeon: Elta Guadeloupe T. Gershon Crane, MD;  Location: [facility];  Service: Ophthalmology;  Laterality: Right;  CDE:10.81  . Cataract extraction w/phaco  11/02/2011    Procedure: CATARACT EXTRACTION PHACO AND INTRAOCULAR LENS PLACEMENT (IOC);  Surgeon: Elta Guadeloupe T. Gershon Crane, MD;  Location: [facility];  Service: Ophthalmology;  Laterality: Left;  CDE 8.60  . Carpal tunnel release      SOCIAL HISTORY:  Social History  Substance Use Topics  . Smoking status: Former Smoker -- 1.25 packs/day for 40 years    Types: Cigarettes  . Smokeless tobacco: Not on file  . Alcohol Use: No    FAMILY HISTORY:  Family History  Problem Relation Age of Onset  . Anesthesia problems Neg Hx   . Hypotension Neg Hx   . Malignant hyperthermia Neg Hx   . Pseudochol deficiency Neg Hx   . Heart disease Father   . Heart disease Mother   . Stroke Mother   . Cancer Sister   . Breast cancer Sister 91  . Breast cancer Paternal Aunt     DRUG ALLERGIES:  Allergies  Allergen  Reactions  . Cyclobenzaprine Rash    Severe Rash  . Flexeril [Cyclobenzaprine Hcl]     Severe Rash  . Vancomycin Rash    Red Mans Syndrome    REVIEW OF SYSTEMS:   CONSTITUTIONAL: No fever, fatigue or weakness.  EYES: No blurred or double vision.  EARS, NOSE, AND THROAT: No tinnitus or ear pain.  RESPIRATORY: No cough, shortness of breath, wheezing or hemoptysis.  CARDIOVASCULAR: No chest pain, orthopnea, edema.  GASTROINTESTINAL: No nausea, vomiting, diarrhea or abdominal pain.  GENITOURINARY: No dysuria, hematuria. Has increased urinary frequency. ENDOCRINE: Has polyuria HEMATOLOGY: No anemia, easy bruising or bleeding SKIN: No rash or lesion. MUSCULOSKELETAL: No joint pain or arthritis.   NEUROLOGIC: No tingling, numbness, weakness. Dizziness  noted PSYCHIATRY: No anxiety or depression.   MEDICATIONS AT HOME:  Prior to Admission medications   Medication Sig Start Date End Date Taking? Authorizing Provider  albuterol (PROVENTIL) (2.5 MG/3ML) 0.083% nebulizer solution Take 3 mLs (2.5 mg total) by nebulization every 8 (eight) hours as needed. Shortness of breath and wheezing 06/17/15   Arlis Porta., MD  aspirin 325 MG tablet Take 325 mg by mouth daily.    Historical Provider, MD  bisacodyl (DULCOLAX) 5 MG EC tablet Take 2 tablets (10 mg total) by mouth daily as needed for moderate constipation. 09/24/15   Milinda Pointer, MD  calcium citrate-vitamin D (CITRACAL+D) 315-200 MG-UNIT per tablet Take 1 tablet by mouth 2 (two) times daily.    Historical Provider, MD  Cholecalciferol (VITAMIN D3) 2000 units TABS Take 1 tablet by mouth daily. 09/24/15   Milinda Pointer, MD  furosemide (LASIX) 20 MG tablet Take 1 tablet (20 mg total) by mouth daily. 03/03/15   Arlis Porta., MD  levocetirizine (XYZAL) 5 MG tablet Take 1 tablet (5 mg total) by mouth every evening. 06/17/15   Arlis Porta., MD  lisinopril (PRINIVIL,ZESTRIL) 10 MG tablet Take 10 mg by mouth daily.    Historical Provider, MD  magnesium oxide (MAG-OX) 400 MG tablet Take 1 tablet (400 mg total) by mouth daily. 09/24/15   Milinda Pointer, MD  Melatonin 5 MG CAPS Take 2 capsules by mouth at bedtime.     Historical Provider, MD  omeprazole (PRILOSEC) 20 MG capsule Take 20 mg by mouth daily.    Historical Provider, MD  oxyCODONE-acetaminophen (PERCOCET) 10-325 MG tablet Take 2 tablets by mouth every 8 (eight) hours as needed for pain. 09/24/15   Milinda Pointer, MD  rOPINIRole (REQUIP) 2 MG tablet Take 1 tablet (2 mg total) by mouth at bedtime. 09/24/15   Milinda Pointer, MD  Wheat Dextrin (BENEFIBER) POWD Stir 2 teaspoons of Benefiber into 4-8 oz of any non-carbonated beverage or soft food (hot or cold) PO TID 09/24/15   Milinda Pointer, MD      PHYSICAL EXAMINATION:    VITAL SIGNS: Blood pressure 134/78, pulse 61, temperature 97.9 F (36.6 C), temperature source Oral, resp. rate 16, height '5\' 9"'$  (1.753 m), weight 79.379 kg (175 lb), SpO2 96 %.  GENERAL:  82 y.o.-year-old patient lying in the bed with no acute distress.  EYES: Pupils equal, round, reactive to light and accommodation. No scleral icterus. Extraocular muscles intact.  HEENT: Head atraumatic, normocephalic. Oropharynx and nasopharynx clear.  NECK:  Supple, no jugular venous distention. No thyroid enlargement, no tenderness.  LUNGS: Normal breath sounds bilaterally, no wheezing, rales,rhonchi or crepitation. No use of accessory muscles of respiration.  CARDIOVASCULAR: S1, S2 irregular. No murmurs, rubs, or gallops.  ABDOMEN: Soft, nontender, nondistended. Bowel sounds present. No organomegaly or mass.  EXTREMITIES: No pedal edema, cyanosis, or clubbing.  NEUROLOGIC: Cranial nerves II through XII are intact. Muscle strength 5/5 in all extremities. Sensation intact. No cerebellar signs noted. PSYCHIATRIC: The patient is alert and oriented x 3.  SKIN: No obvious rash, lesion, or ulcer.   LABORATORY PANEL:   CBC  Recent Labs Lab 10/28/15 2259  WBC 7.2  HGB 14.2  HCT 42.5  PLT 175  MCV 91.0  MCH 30.4  MCHC 33.4  RDW 15.2*   ------------------------------------------------------------------------------------------------------------------  Chemistries   Recent Labs Lab 10/28/15 2259  NA 142  K 4.4  CL 109  CO2 26  GLUCOSE 113*  BUN 27*  CREATININE 1.51*  CALCIUM 10.0   ------------------------------------------------------------------------------------------------------------------ estimated creatinine clearance is 29 mL/min (by C-G formula based on Cr of 1.51). ------------------------------------------------------------------------------------------------------------------ No results for input(s): TSH, T4TOTAL, T3FREE, THYROIDAB in the last 72 hours.  Invalid input(s):  FREET3   Coagulation profile No results for input(s): INR, PROTIME in the last 168 hours. ------------------------------------------------------------------------------------------------------------------- No results for input(s): DDIMER in the last 72 hours. -------------------------------------------------------------------------------------------------------------------  Cardiac Enzymes  Recent Labs Lab 10/28/15 2259  TROPONINI 0.03   ------------------------------------------------------------------------------------------------------------------ Invalid input(s): POCBNP  ---------------------------------------------------------------------------------------------------------------  Urinalysis    Component Value Date/Time   COLORURINE YELLOW* 10/28/2015 2329   APPEARANCEUR CLEAR* 10/28/2015 2329   LABSPEC 1.013 10/28/2015 2329   PHURINE 5.0 10/28/2015 2329   GLUCOSEU NEGATIVE 10/28/2015 2329   HGBUR NEGATIVE 10/28/2015 2329   BILIRUBINUR NEGATIVE 10/28/2015 2329   BILIRUBINUR Negative 09/03/2015 1036   BILIRUBINUR negative 06/17/2015 1130   KETONESUR NEGATIVE 10/28/2015 2329   PROTEINUR NEGATIVE 10/28/2015 2329   PROTEINUR negative 06/17/2015 1130   UROBILINOGEN negative 06/17/2015 1130   UROBILINOGEN 0.2 02/17/2011 1050   NITRITE NEGATIVE 10/28/2015 2329   NITRITE Negative 09/03/2015 1036   NITRITE neagtive 06/17/2015 1130   LEUKOCYTESUR 3+* 10/28/2015 2329   LEUKOCYTESUR Trace* 09/03/2015 1036     RADIOLOGY: Dg Chest 2 View  10/29/2015  CLINICAL DATA:  82 year old female with dizziness. Sudden onset of diaphoresis and shortness of breath. EXAM: CHEST  2 VIEW COMPARISON:  CT 09/07/2014.  Radiographs 08/27/2014 FINDINGS: Cardiomediastinal contours are unchanged with mild cardiomegaly and tortuous thoracic aorta. Chronic interstitial thickening is unchanged from prior exam. Basilar honeycombing on CT less well appreciated radiographically. No evidence of pulmonary  edema, confluent airspace disease, pleural effusion or pneumothorax. Air-filled colon under the right hemidiaphragm. Post bilateral shoulder arthroplasties. No acute osseous abnormalities. IMPRESSION: Chronic interstitial lung disease and cardiomegaly. No superimposed acute process Electronically Signed   By: Jeb Levering M.D.   On: 10/29/2015 00:19   Ct Head Wo Contrast  10/29/2015  CLINICAL DATA:  Sudden onset of dizziness and diaphoresis prior to arrival, now resolved. Similar episode 4 days prior. EXAM: CT HEAD WITHOUT CONTRAST TECHNIQUE: Contiguous axial images were obtained from the base of the skull through the vertex without intravenous contrast. COMPARISON:  09/06/2014 FINDINGS: Age-related atrophy and mild chronic small vessel ischemia.No intracranial hemorrhage, mass effect, or midline shift. No hydrocephalus. The basilar cisterns are patent. No evidence of territorial infarct. No intracranial fluid collection. Upper scleroses of skullbase vasculature. Calvarium is intact. Included paranasal sinuses are well aerated. Minimal in chronic opacification of lower left mastoid air cells. IMPRESSION: Stable atrophy and chronic small vessel ischemia, no acute intracranial abnormality. Electronically Signed   By: Jeb Levering M.D.   On: 10/29/2015 00:39    EKG: Orders placed or performed during the  hospital encounter of 10/28/15  . ED EKG  . ED EKG  . EKG 12-Lead  . EKG 12-Lead    IMPRESSION AND PLAN: 82 year old female patient with history of arthritis, COPD, congestive heart failure, hypertension, hyperlipidemia presented to the emergency room and she felt dizzy and about to pass out. Admitting diagnosis 1. Near syncope 2. Urinary tract infection 3. Dizziness 4. New onset atrial flutter 5. Congestive heart failure stable 6. Emphysema  Treatment plan Admit patient to inpatient service Check echocardiogram and carotid ultrasound to rule out obstruction Anticoagulate patient with  subcutaneous Lovenox with full dose Cycle troponin check for ischemia Nebulization treatments Cardiology consultation IV Rocephin antibiotic for urinary tract infection Supportive care All the records are reviewed and case discussed with ED provider. Management plans discussed with the patient, family and they are in agreement.  CODE STATUS:FULL Code Status History    This patient does not have a recorded code status. Please follow your organizational policy for patients in this situation.       TOTAL TIME TAKING CARE OF THIS PATIENT: 52 minutes.    Saundra Shelling M.D on 10/29/2015 at 1:22 AM  Between 7am to 6pm - Pager - [phone]  After 6pm go to [email or web address] - password [email or web address] [facility]  Office  [phone]  CC: Primary care physician; Dicky Doe, MD

## 2015-10-29 NOTE — Progress Notes (Signed)
*  PRELIMINARY RESULTS* Echocardiogram 2D Echocardiogram has been performed.  Kristin [name] 10/29/2015, 9:32 AM

## 2015-10-29 NOTE — Evaluation (Signed)
Physical Therapy Evaluation Patient Details Name: Kristin [name] MRN: [id number] DOB: 1929/11/17 Today's Date: 10/29/2015   History of Present Illness  Patient is an 82 y/o female that presents after several episodes of light headed/diaphoretic states. Neurologic work up thus far has been negative for acute changes.   Clinical Impression  Patient admitted with complaints of multiple near syncopal episodes. No orthostatic hypotension noted during transfers. Patient reports she typically doesn't use any device for mobility, if she does it's a single point cane. Today she demonstrates no deficits in bed mobility or transfers. She does demonstrate decreased higher level balance (such as head turns) with ambulation in hallways without a device. Patient would benefit from outpatient PT services to address higher level balance, however no overt loss of balance noted today and no falls history to speak of. Skilled acute PT services are indicated to address her mobility deficits listed below.     Follow Up Recommendations Outpatient PT (If patient has any balance concerns)    Equipment Recommendations       Recommendations for Other Services       Precautions / Restrictions Precautions Precautions: Fall Restrictions Weight Bearing Restrictions: No      Mobility  Bed Mobility Overal bed mobility: Independent             General bed mobility comments: No deficits identified in bed mobility.   Transfers Overall transfer level: Independent Equipment used: None             General transfer comment: No deficits identified, though anterior trunk lean noted.   Ambulation/Gait Ambulation/Gait assistance: Supervision Ambulation Distance (Feet): 200 Feet Assistive device: Rolling walker (2 wheeled) Gait Pattern/deviations: WFL(Within Functional Limits);Decreased step length - right;Decreased step length - left;Narrow base of support   Gait velocity interpretation: Below normal speed  for age/gender General Gait Details: Patient demonstrates mild balance deficits throughout gait, modified DGI of 10/12 without device, no overt loss of balance noted.   Stairs            Wheelchair Mobility    Modified Rankin (Stroke Patients Only)       Balance Overall balance assessment: Needs assistance   Sitting balance-Leahy Scale: Good     Standing balance support: No upper extremity supported Standing balance-Leahy Scale: Good Standing balance comment: Modified DGI of 10/12                             Pertinent Vitals/Pain Pain Assessment: Faces Faces Pain Scale: No hurt    Home Living Family/patient expects to be discharged to:: Private residence Living Arrangements: Alone Available Help at Discharge: Family Type of Home: House Home Access: Level entry     Home Layout: One level Home Equipment: Cane - single point      Prior Function Level of Independence: Independent with assistive device(s)         Comments: Patient has been an independent ambulator with/without SPC with no reprots of falls.      Hand Dominance        Extremity/Trunk Assessment   Upper Extremity Assessment: Overall WFL for tasks assessed           Lower Extremity Assessment: Overall WFL for tasks assessed         Communication   Communication: No difficulties  Cognition Arousal/Alertness: Awake/alert Behavior During Therapy: WFL for tasks assessed/performed Overall Cognitive Status: Within Functional Limits for tasks assessed  General Comments      Exercises        Assessment/Plan    PT Assessment Patient needs continued PT services  PT Diagnosis Difficulty walking   PT Problem List Decreased mobility;Decreased balance;Decreased strength  PT Treatment Interventions Gait training;DME instruction;Therapeutic activities;Therapeutic exercise;Balance training   PT Goals (Current goals can be found in the Care Plan  section) Acute Rehab PT Goals Patient Stated Goal: To return home safely  PT Goal Formulation: With patient Time For Goal Achievement: 11/12/15 Potential to Achieve Goals: Good    Frequency Min 2X/week   Barriers to discharge        Co-evaluation               End of Session Equipment Utilized During Treatment: Gait belt Activity Tolerance: Patient tolerated treatment well Patient left: in bed;with call bell/phone within reach;with bed alarm set;with family/visitor present Nurse Communication: Mobility status         Time: 0254-8628 PT Time Calculation (min) (ACUTE ONLY): 15 min   Charges:   PT Evaluation $PT Eval Moderate Complexity: 1 Procedure     PT G Codes:       Kerman Passey, PT, DPT    10/29/2015, 3:18 PM

## 2015-10-29 NOTE — Progress Notes (Signed)
Pharmacy Antibiotic Note  Kristin [name] is a 82 y.o. female admitted on 10/28/2015 with UTI.  Pharmacy has been consulted for ceftriaxone dosing.  Plan: Will reduce to 1 g daily as pt is not septic, no signs of bacteremia.   Height: '5\' 9"'$  (175.3 cm) Weight: 172 lb 9.6 oz (78.291 kg) IBW/kg (Calculated) : 66.2  Temp (24hrs), Avg:97.8 F (36.6 C), Min:97.7 F (36.5 C), Max:97.9 F (36.6 C)   Recent Labs Lab 10/28/15 2259 10/29/15 0357  WBC 7.2  --   CREATININE 1.51* 1.25*    Estimated Creatinine Clearance: 35 mL/min (by C-G formula based on Cr of 1.25).    Allergies  Allergen Reactions  . Cyclobenzaprine Rash    Severe Rash  . Flexeril [Cyclobenzaprine Hcl]     Severe Rash  . Vancomycin Rash    Red Mans Syndrome    Antimicrobials this admission:   Dose adjustments this admission:   Microbiology results:  3/8 UCx: pending   UA: LE(+) NO2(-)  WBC 6-30 CXR: no acute disease  Thank you for allowing pharmacy to be a part of this patient's care.  Ramond Dial 10/29/2015 11:04 AM

## 2015-10-29 NOTE — Progress Notes (Signed)
[name], female   DOB: February 25, 1930, 82 y.o.   MRN: [id number] [facility] PROGRESS NOTE  Kristin [name]:[id number] DOB: February 21, 1930 DOA: 10/28/2015 PCP: Dicky Doe, MD  HPI/Subjective: Patient had dizziness and weakness and near passing out episode. She was found to be in atrial flutter.  Objective: Filed Vitals:   10/29/15 0822 10/29/15 1118  BP: 143/98 138/98  Pulse: 55 60  Temp:  98.3 F (36.8 C)  Resp:  18    Filed Weights   10/28/15 2255 10/29/15 0236  Weight: 79.379 kg (175 lb) 78.291 kg (172 lb 9.6 oz)    ROS: Review of Systems  Constitutional: Negative for fever and chills.  Eyes: Negative for blurred vision.  Respiratory: Negative for cough and shortness of breath.   Cardiovascular: Negative for chest pain.  Gastrointestinal: Negative for nausea, vomiting, abdominal pain, diarrhea and constipation.  Genitourinary: Negative for dysuria.  Musculoskeletal: Negative for joint pain.  Neurological: Positive for weakness. Negative for dizziness and headaches.   Exam: Physical Exam  Constitutional: She is oriented to person, place, and time.  HENT:  Nose: No mucosal edema.  Mouth/Throat: No oropharyngeal exudate or posterior oropharyngeal edema.  Eyes: Conjunctivae, EOM and lids are normal. Pupils are equal, round, and reactive to light.  Neck: No JVD present. Carotid bruit is not present. No edema present. No thyroid mass and no thyromegaly present.  Cardiovascular: S1 normal and S2 normal.  An irregularly irregular rhythm present. Exam reveals no gallop.   No murmur heard. Pulses:      Dorsalis pedis pulses are 2+ on the right side, and 2+ on the left side.  Respiratory: No respiratory distress. She has no wheezes. She has no rhonchi. She has no rales.  GI: Soft. Bowel sounds are normal. There is no tenderness.  Musculoskeletal:       Right ankle: She exhibits no swelling.       Left ankle: She exhibits no swelling.   Lymphadenopathy:    She has no cervical adenopathy.  Neurological: She is alert and oriented to person, place, and time. No cranial nerve deficit.  Skin: Skin is warm. No rash noted. Nails show no clubbing.  Psychiatric: She has a normal mood and affect.      Data Reviewed: Basic Metabolic Panel:  Recent Labs Lab 10/28/15 2259 10/29/15 0357  NA 142 141  K 4.4 5.0  CL 109 111  CO2 26 21*  GLUCOSE 113* 88  BUN 27* 27*  CREATININE 1.51* 1.25*  CALCIUM 10.0 9.3   CBC:  Recent Labs Lab 10/28/15 2259  WBC 7.2  HGB 14.2  HCT 42.5  MCV 91.0  PLT 175   Cardiac Enzymes:  Recent Labs Lab 10/28/15 2259 10/29/15 0357 10/29/15 0839 10/29/15 1435  TROPONINI 0.03 <0.03 <0.03 <0.03     Studies: Dg Chest 2 View  10/29/2015  CLINICAL DATA:  82 year old female with dizziness. Sudden onset of diaphoresis and shortness of breath. EXAM: CHEST  2 VIEW COMPARISON:  CT 09/07/2014.  Radiographs 08/27/2014 FINDINGS: Cardiomediastinal contours are unchanged with mild cardiomegaly and tortuous thoracic aorta. Chronic interstitial thickening is unchanged from prior exam. Basilar honeycombing on CT less well appreciated radiographically. No evidence of pulmonary edema, confluent airspace disease, pleural effusion or pneumothorax. Air-filled colon under the right hemidiaphragm. Post bilateral shoulder arthroplasties. No acute osseous abnormalities. IMPRESSION: Chronic interstitial lung disease and cardiomegaly. No superimposed acute process Electronically Signed   By: Jeb Levering M.D.   On: 10/29/2015  00:19   Ct Head Wo Contrast  10/29/2015  CLINICAL DATA:  Sudden onset of dizziness and diaphoresis prior to arrival, now resolved. Similar episode 4 days prior. EXAM: CT HEAD WITHOUT CONTRAST TECHNIQUE: Contiguous axial images were obtained from the base of the skull through the vertex without intravenous contrast. COMPARISON:  09/06/2014 FINDINGS: Age-related atrophy and mild chronic small  vessel ischemia.No intracranial hemorrhage, mass effect, or midline shift. No hydrocephalus. The basilar cisterns are patent. No evidence of territorial infarct. No intracranial fluid collection. Upper scleroses of skullbase vasculature. Calvarium is intact. Included paranasal sinuses are well aerated. Minimal in chronic opacification of lower left mastoid air cells. IMPRESSION: Stable atrophy and chronic small vessel ischemia, no acute intracranial abnormality. Electronically Signed   By: Jeb Levering M.D.   On: 10/29/2015 00:39    Scheduled Meds: . apixaban  5 mg Oral BID  . aspirin  325 mg Oral Daily  . calcium-vitamin D  1 tablet Oral BID  . cefTRIAXone (ROCEPHIN)  IV  1 g Intravenous Q24H  . cholecalciferol  2,000 Units Oral Daily  . lisinopril  10 mg Oral Daily  . magnesium oxide  400 mg Oral Daily  . pantoprazole  40 mg Oral Daily  . psyllium  1 packet Oral Daily  . rOPINIRole  2 mg Oral QHS  . sodium chloride flush  3 mL Intravenous Q12H  . triamcinolone cream  1 application Topical BID   Continuous Infusions: . sodium chloride 30 mL/hr at 10/29/15 0902    Assessment/Plan:  1. Atrial flutter rate controlled. Patient with near syncope. Echocardiogram with normal EF and moderate mitral regurgitation. Patient seen in consultation by Dr. Ubaldo Glassing cardiology and started on eliquis. I will stop aspirin at this point. Gave 1 dose of IV magnesium 2. Acute cystitis without hematuria- on Rocephin 3. Acute renal insufficiency and Dehydration- creatinine improved with IV fluids 4. Essential hypertension continue lisinopril 5. Gastroesophageal reflux disease on Protonix 6. Restless leg syndrome on Requip  Code Status:     Code Status Orders        Start     Ordered   10/29/15 0236  Full code   Continuous     10/29/15 0235    Code Status History    Date Active Date Inactive Code Status Order ID Comments User Context   This patient has a current code status but no historical code  status.     Disposition Plan: Home soon  Consultants:  Cardiology  Antibiotics:  Rocephin  Time spent: 35 minutes  Loletha Grayer  [facility]

## 2015-10-29 NOTE — Consult Note (Signed)
[place]  CARDIOLOGY CONSULT NOTE  [name] MRN: [id number] DOB/AGE: 1930/06/03 82 y.o.  Admit date: 10/28/2015 Referring Physician Dr. Leslye Peer Primary Physician   Primary Cardiologist Dr. Saralyn Pilar Reason for Consultation near syncope  HPI: Pt is a 82 yo female with history of hypertension, hyperlipidemia, o2 dependent copd, history of chf who was admitted with a second episode of near syncope. She states she was sitting on her bed watching tv when she felt real hot followed by near syncope. She had a similar episode several weeks earlier. She denies chest pain or rapid heart rate. She was noted to be in atrial flutter/fib in the er. She has not been diagnosed with this previously apparently. Echo done during this admission showed ef of 55-65% with moderate mr, mild tr. She has ruled out for mi. CXR revealed interstitial lung disease and cardiomegaly with no chf. No acute intracranial abnormalities on head ct.   Review of Systems  Constitutional: Positive for diaphoresis.  HENT: Negative.   Eyes: Negative.   Respiratory: Negative.   Cardiovascular: Negative.   Gastrointestinal: Negative.   Genitourinary: Negative.   Musculoskeletal: Negative.   Skin: Negative.   Neurological: Positive for dizziness.  Endo/Heme/Allergies: Negative.   Psychiatric/Behavioral: Negative.     Past Medical History  Diagnosis Date  . Hypertension   . Hyperlipidemia   . COPD (chronic obstructive pulmonary disease) (HCC)     wears O2 at 2L via [place] at night  . Arthritis   . Chronic back pain   . CHF (congestive heart failure) ([place])   . Hepatitis C   . Gout   . Arthritis   . CKD (chronic kidney disease)   . OAB (overactive bladder)   . UTI (lower urinary tract infection)   . Cystocele   . Vaginal atrophy   . Bradycardia   . Hematuria   . Urinary frequency   . Hypomagnesemia 06/25/2015  . Cancer [facility]) 2004    rt breast cancer-post  lumpectomy- chemo/rad    Family History  Problem Relation Age of Onset  . Anesthesia problems Neg Hx   . Hypotension Neg Hx   . Malignant hyperthermia Neg Hx   . Pseudochol deficiency Neg Hx   . Heart disease Father   . Heart disease Mother   . Stroke Mother   . Cancer Sister   . Breast cancer Sister 12  . Breast cancer Paternal Aunt     Social History   Social History  . Marital Status: Single    Spouse Name: N/A  . Number of Children: N/A  . Years of Education: N/A   Occupational History  . retired    Social History Main Topics  . Smoking status: Former Smoker -- 1.25 packs/day for 40 years    Types: Cigarettes  . Smokeless tobacco: Not on file  . Alcohol Use: No  . Drug Use: No  . Sexual Activity: Yes    Birth Control/ Protection: Surgical   Other Topics Concern  . Not on file   Social History Narrative    Past Surgical History  Procedure Laterality Date  . Abdominal hysterectomy    . Joint replacement      bilateral TKA  . Back surgery    . Rotator cuff repair      bilateral  . Breast surgery      rt lumpectomy  . Cataract extraction w/phaco  10/19/2011    Procedure: CATARACT EXTRACTION PHACO AND INTRAOCULAR  LENS PLACEMENT (IOC);  Surgeon: Elta Guadeloupe T. Gershon Crane, MD;  Location: [facility];  Service: Ophthalmology;  Laterality: Right;  CDE:10.81  . Cataract extraction w/phaco  11/02/2011    Procedure: CATARACT EXTRACTION PHACO AND INTRAOCULAR LENS PLACEMENT (IOC);  Surgeon: Elta Guadeloupe T. Gershon Crane, MD;  Location: [facility];  Service: Ophthalmology;  Laterality: Left;  CDE 8.60  . Carpal tunnel release       Prescriptions prior to admission  Medication Sig Dispense Refill Last Dose  . albuterol (PROVENTIL) (2.5 MG/3ML) 0.083% nebulizer solution Take 3 mLs (2.5 mg total) by nebulization every 8 (eight) hours as needed. Shortness of breath and wheezing 75 mL 12 10/28/2015 at Unknown time  . aspirin 325 MG tablet Take 325 mg by mouth daily.   10/28/2015 at 0800  . bisacodyl (DULCOLAX)  5 MG EC tablet Take 2 tablets (10 mg total) by mouth daily as needed for moderate constipation. 60 tablet PRN 10/28/2015 at Unknown time  . calcium citrate-vitamin D (CITRACAL+D) 315-200 MG-UNIT per tablet Take 1 tablet by mouth 2 (two) times daily.   10/28/2015 at Unknown time  . Cholecalciferol (VITAMIN D3) 2000 units TABS Take 1 tablet by mouth daily. 30 tablet 2 10/28/2015 at Unknown time  . furosemide (LASIX) 20 MG tablet Take 1 tablet (20 mg total) by mouth daily. 30 tablet 12 10/28/2015 at Unknown time  . levocetirizine (XYZAL) 5 MG tablet Take 1 tablet (5 mg total) by mouth every evening. 30 tablet 12 10/28/2015 at Unknown time  . lisinopril (PRINIVIL,ZESTRIL) 10 MG tablet Take 10 mg by mouth daily.   10/28/2015 at Unknown time  . magnesium oxide (MAG-OX) 400 MG tablet Take 1 tablet (400 mg total) by mouth daily. 30 tablet 2 10/28/2015 at Unknown time  . omeprazole (PRILOSEC) 20 MG capsule Take 20 mg by mouth daily.   10/28/2015 at Unknown time  . oxyCODONE-acetaminophen (PERCOCET) 10-325 MG tablet Take 2 tablets by mouth every 8 (eight) hours as needed for pain. 180 tablet 0 10/28/2015 at Unknown time  . rOPINIRole (REQUIP) 2 MG tablet Take 1 tablet (2 mg total) by mouth at bedtime. 30 tablet 2 10/28/2015 at Unknown time  . Wheat Dextrin (BENEFIBER) POWD Stir 2 teaspoons of Benefiber into 4-8 oz of any non-carbonated beverage or soft food (hot or cold) PO TID 500 g PRN 10/28/2015 at Unknown time    Physical Exam: Blood pressure 138/98, pulse 60, temperature 98.3 F (36.8 C), temperature source Oral, resp. rate 18, height '5\' 9"'$  (1.753 m), weight 78.291 kg (172 lb 9.6 oz), SpO2 94 %.   Wt Readings from Last 1 Encounters:  10/29/15 78.291 kg (172 lb 9.6 oz)     General appearance: alert, cooperative and appears stated age Head: Normocephalic, without obvious abnormality, atraumatic Resp: clear to auscultation bilaterally Chest wall: no tenderness Cardio: irregularly irregular rhythm GI: soft, non-tender;  bowel sounds normal; no masses,  no organomegaly Pulses: 2+ and symmetric Neurologic: Grossly normal  Labs:   Lab Results  Component Value Date   WBC 7.2 10/28/2015   HGB 14.2 10/28/2015   HCT 42.5 10/28/2015   MCV 91.0 10/28/2015   PLT 175 10/28/2015    Recent Labs Lab 10/29/15 0357  NA 141  K 5.0  CL 111  CO2 21*  BUN 27*  CREATININE 1.25*  CALCIUM 9.3  GLUCOSE 88   Lab Results  Component Value Date   TROPONINI <0.03 10/29/2015      Radiology: cardiomegaly with no chf EKG: afib with controlled vr  ASSESSMENT AND PLAN:  82 yo female with history of diastollic chf, history of mr dm now with afib with variable vr. Admitted after several near syncopal episodes. Has lost about 15 pounds of weight recently. New afib on ekg. Symptoms causeing dizziness had appearance of vasovagal episodes but afib is new adn patient may be having runs of rvr vs pauses when she converts from afib to nsr. No evidence of this on tele. CHADS2VASc score is at least 6. Will add eliquis 5 mg bid. Continue with current other meds including lisinopril  Signed: Teodoro Spray MD, [facility] 10/29/2015, 12:57 PM

## 2015-10-29 NOTE — Progress Notes (Signed)
Patient was admitted from the ED following c/o dizziness. On arrival to the unit patient was ambulatory and accompanied with her grand daughter. She was A&O X4 and denied pain, SOB or dizziness. Patient was oriented to her room, fall contract was signed. Patient remained on control aflutter in the 60-70 and VS WDL for patient.

## 2015-10-29 NOTE — ED Notes (Signed)
Pt returned from CT and x-ray. NAD noted.

## 2015-10-29 NOTE — Progress Notes (Signed)
PHARMACIST - PHYSICIAN ORDER COMMUNICATION  CONCERNING: P&T Medication Policy on Herbal Medications  DESCRIPTION:  This patient's order for:  melatonin  has been noted.  This product(s) is classified as an "herbal" or natural product. Due to a lack of definitive safety studies or FDA approval, nonstandard manufacturing practices, plus the potential risk of unknown drug-drug interactions while on inpatient medications, the Pharmacy and Therapeutics Committee does not permit the use of "herbal" or natural products of this type within [facility].   ACTION TAKEN: The pharmacy department is unable to verify this order at this time. Please reevaluate patient's clinical condition at discharge and address if the herbal or natural product(s) should be resumed at that time.

## 2015-10-30 MED ORDER — FUROSEMIDE 40 MG PO TABS
40.0000 mg | ORAL_TABLET | Freq: Once | ORAL | Status: AC
  Administered 2015-10-30: 40 mg via ORAL
  Filled 2015-10-30: qty 1

## 2015-10-30 MED ORDER — ATROPINE SULFATE 1 MG/ML IJ SOLN
0.4000 mg | INTRAMUSCULAR | Status: DC | PRN
  Filled 2015-10-30: qty 0.4

## 2015-10-30 MED ORDER — CEPHALEXIN 500 MG PO CAPS: 500.0000 mg | ORAL_CAPSULE | Freq: Three times a day (TID) | ORAL | Status: DC

## 2015-10-30 MED ORDER — APIXABAN 5 MG PO TABS: 5.0000 mg | ORAL_TABLET | Freq: Two times a day (BID) | ORAL | Status: DC

## 2015-10-30 NOTE — Discharge Summary (Signed)
[place] [facility] [place] NAME: Kristin [name]    MR#:  [id number]  DATE OF BIRTH:  03/17/1930  DATE OF ADMISSION:  10/28/2015 ADMITTING PHYSICIAN: Saundra Shelling, MD  DATE OF DISCHARGE: 10/30/2015  4:21 PM  PRIMARY CARE PHYSICIAN: Dicky Doe, MD    ADMISSION DIAGNOSIS:  Weakness [R53.1] UTI (lower urinary tract infection) [N39.0] Near syncope [R55] New onset atrial flutter ([place]) [I48.92]  DISCHARGE DIAGNOSIS:  Principal Problem:   Near syncope Active Problems:   Atrial flutter ([place])   UTI (lower urinary tract infection)   SECONDARY DIAGNOSIS:   Past Medical History  Diagnosis Date  . Hypertension   . Hyperlipidemia   . COPD (chronic obstructive pulmonary disease) (HCC)     wears O2 at 2L via [place] at night  . Arthritis   . Chronic back pain   . CHF (congestive heart failure) ([place])   . Hepatitis C   . Gout   . Arthritis   . CKD (chronic kidney disease)   . OAB (overactive bladder)   . UTI (lower urinary tract infection)   . Cystocele   . Vaginal atrophy   . Bradycardia   . Hematuria   . Urinary frequency   . Hypomagnesemia 06/25/2015  . Cancer [facility]) 2004    rt breast cancer-post lumpectomy- chemo/rad    HOSPITAL COURSE:   1. Atrial flutter. Patient also had episodes of bradycardia and some pauses. Patient was seen in consultation by Dr. Ubaldo Glassing cardiology. Echocardiogram was done which showed normal EF and moderate mitral regurgitation. Dr. Ubaldo Glassing recommended an outpatient Holter monitor which he will set up. He also recommended eliquis for anticoagulation. Benefits and risks of major blood thinner explained to the patient and all questions answered. Patient already has a follow-up appointment with Dr. Lorinda Creed cardiology. 2. Near syncope. Likely multifactorial with urinary tract infection, dehydration and also atrial flutter. 3. Acute cystitis without hematuria. Patient was started on Rocephin and switched over to Keflex  upon going home. Urine culture grew out mixed organisms. 4. Essential hypertension continue lisinopril and can restart Lasix 5. History of congestive heart failure. Currently no signs. Patient was given IV fluids during the hospital course. 6. Restless leg syndrome on Requip  DISCHARGE CONDITIONS:   Satisfactory  CONSULTS OBTAINED:  Treatment Team:  Teodoro Spray, MD  DRUG ALLERGIES:   Allergies  Allergen Reactions  . Cyclobenzaprine Rash    Severe Rash  . Flexeril [Cyclobenzaprine Hcl]     Severe Rash  . Vancomycin Rash    [place] Syndrome    DISCHARGE MEDICATIONS:   Discharge Medication List as of 10/30/2015  3:42 PM    START taking these medications   Details  apixaban (ELIQUIS) 5 MG TABS tablet Take 1 tablet (5 mg total) by mouth 2 (two) times daily., Starting 10/30/2015, Until Discontinued, Print    cephALEXin (KEFLEX) 500 MG capsule Take 1 capsule (500 mg total) by mouth 3 (three) times daily., Starting 10/30/2015, Until Discontinued, Print      CONTINUE these medications which have NOT CHANGED   Details  albuterol (PROVENTIL) (2.5 MG/3ML) 0.083% nebulizer solution Take 3 mLs (2.5 mg total) by nebulization every 8 (eight) hours as needed. Shortness of breath and wheezing, Starting 06/17/2015, Until Discontinued, Normal    bisacodyl (DULCOLAX) 5 MG EC tablet Take 2 tablets (10 mg total) by mouth daily as needed for moderate constipation., Starting 09/24/2015, Until Discontinued, Normal    calcium citrate-vitamin D (CITRACAL+D) 315-200  MG-UNIT per tablet Take 1 tablet by mouth 2 (two) times daily., Until Discontinued, Historical Med    Cholecalciferol (VITAMIN D3) 2000 units TABS Take 1 tablet by mouth daily., Starting 09/24/2015, Until Discontinued, Normal    furosemide (LASIX) 20 MG tablet Take 1 tablet (20 mg total) by mouth daily., Starting 03/03/2015, Until Discontinued, Normal    levocetirizine (XYZAL) 5 MG tablet Take 1 tablet (5 mg total) by mouth every evening.,  Starting 06/17/2015, Until Discontinued, Normal    lisinopril (PRINIVIL,ZESTRIL) 10 MG tablet Take 10 mg by mouth daily., Until Discontinued, Historical Med    magnesium oxide (MAG-OX) 400 MG tablet Take 1 tablet (400 mg total) by mouth daily., Starting 09/24/2015, Until Discontinued, Normal    omeprazole (PRILOSEC) 20 MG capsule Take 20 mg by mouth daily., Until Discontinued, Historical Med    oxyCODONE-acetaminophen (PERCOCET) 10-325 MG tablet Take 2 tablets by mouth every 8 (eight) hours as needed for pain., Starting 09/24/2015, Until Discontinued, Print    rOPINIRole (REQUIP) 2 MG tablet Take 1 tablet (2 mg total) by mouth at bedtime., Starting 09/24/2015, Until Discontinued, Normal    Wheat Dextrin (BENEFIBER) POWD Stir 2 teaspoons of Benefiber into 4-8 oz of any non-carbonated beverage or soft food (hot or cold) PO TID, Normal      STOP taking these medications     aspirin 325 MG tablet      triamcinolone cream (KENALOG) 0.1 %          DISCHARGE INSTRUCTIONS:    Follow-up PMD one week.  Follow-up with Dr. Lorinda Creed as scheduled.  If you experience worsening of your admission symptoms, develop shortness of breath, life threatening emergency, suicidal or homicidal thoughts you must seek medical attention immediately by calling 911 or calling your MD immediately  if symptoms less severe.  You Must read complete instructions/literature along with all the possible adverse reactions/side effects for all the Medicines you take and that have been prescribed to you. Take any new Medicines after you have completely understood and accept all the possible adverse reactions/side effects.   Please note  You were cared for by a hospitalist during your hospital stay. If you have any questions about your discharge medications or the care you received while you were in the hospital after you are discharged, you can call the unit and asked to speak with the hospitalist on call if the hospitalist  that took care of you is not available. Once you are discharged, your primary care physician will handle any further medical issues. Please note that NO REFILLS for any discharge medications will be authorized once you are discharged, as it is imperative that you return to your primary care physician (or establish a relationship with a primary care physician if you do not have one) for your aftercare needs so that they can reassess your need for medications and monitor your lab values.    Today   CHIEF COMPLAINT:   Chief Complaint  Patient presents with  . Dizziness    HISTORY OF PRESENT ILLNESS:  Kristin [name]  is a 82 y.o. female presented with dizziness and near syncope. Found to be in atrial flutter and have a urinary tract infection.   VITAL SIGNS:  Blood pressure 131/72, pulse 88, temperature 97.7 F (36.5 C), temperature source Oral, resp. rate 18, height '5\' 9"'$  (1.753 m), weight 78.291 kg (172 lb 9.6 oz), SpO2 95 %.    PHYSICAL EXAMINATION:  GENERAL:  82 y.o.-year-old patient lying in the bed with no  acute distress.  EYES: Pupils equal, round, reactive to light and accommodation. No scleral icterus. Extraocular muscles intact.  HEENT: Head atraumatic, normocephalic. Oropharynx and nasopharynx clear.  NECK:  Supple, no jugular venous distention. No thyroid enlargement, no tenderness.  LUNGS: Normal breath sounds bilaterally, no wheezing, rales,rhonchi or crepitation. No use of accessory muscles of respiration.  CARDIOVASCULAR: S1, S2 irregular. 2/6 systolic murmurs, no rubs, or gallops.  ABDOMEN: Soft, non-tender, non-distended. Bowel sounds present. No organomegaly or mass.  EXTREMITIES: No pedal edema, cyanosis, or clubbing.  NEUROLOGIC: Cranial nerves II through XII are intact. Muscle strength 5/5 in all extremities. Sensation intact. Gait not checked.  PSYCHIATRIC: The patient is alert and oriented x 3.  SKIN: No obvious rash, lesion, or ulcer.   DATA REVIEW:    CBC  Recent Labs Lab 10/28/15 2259  WBC 7.2  HGB 14.2  HCT 42.5  PLT 175    Chemistries   Recent Labs Lab 10/29/15 0357  NA 141  K 5.0  CL 111  CO2 21*  GLUCOSE 88  BUN 27*  CREATININE 1.25*  CALCIUM 9.3    Cardiac Enzymes  Recent Labs Lab 10/29/15 1435  TROPONINI <0.03    RADIOLOGY:  Dg Chest 2 View  10/29/2015  CLINICAL DATA:  82 year old female with dizziness. Sudden onset of diaphoresis and shortness of breath. EXAM: CHEST  2 VIEW COMPARISON:  CT 09/07/2014.  Radiographs 08/27/2014 FINDINGS: Cardiomediastinal contours are unchanged with mild cardiomegaly and tortuous thoracic aorta. Chronic interstitial thickening is unchanged from prior exam. Basilar honeycombing on CT less well appreciated radiographically. No evidence of pulmonary edema, confluent airspace disease, pleural effusion or pneumothorax. Air-filled colon under the right hemidiaphragm. Post bilateral shoulder arthroplasties. No acute osseous abnormalities. IMPRESSION: Chronic interstitial lung disease and cardiomegaly. No superimposed acute process Electronically Signed   By: Jeb Levering M.D.   On: 10/29/2015 00:19   Ct Head Wo Contrast  10/29/2015  CLINICAL DATA:  Sudden onset of dizziness and diaphoresis prior to arrival, now resolved. Similar episode 4 days prior. EXAM: CT HEAD WITHOUT CONTRAST TECHNIQUE: Contiguous axial images were obtained from the base of the skull through the vertex without intravenous contrast. COMPARISON:  09/06/2014 FINDINGS: Age-related atrophy and mild chronic small vessel ischemia.No intracranial hemorrhage, mass effect, or midline shift. No hydrocephalus. The basilar cisterns are patent. No evidence of territorial infarct. No intracranial fluid collection. Upper scleroses of skullbase vasculature. Calvarium is intact. Included paranasal sinuses are well aerated. Minimal in chronic opacification of lower left mastoid air cells. IMPRESSION: Stable atrophy and chronic small  vessel ischemia, no acute intracranial abnormality. Electronically Signed   By: Jeb Levering M.D.   On: 10/29/2015 00:39    Management plans discussed with the patient, family and they are in agreement.  CODE STATUS:     Code Status Orders        Start     Ordered   10/29/15 0236  Full code   Continuous     10/29/15 0235    Code Status History    Date Active Date Inactive Code Status Order ID Comments User Context   This patient has a current code status but no historical code status.      TOTAL TIME TAKING CARE OF THIS PATIENT: 35 minutes.    Loletha Grayer M.D on 10/30/2015 at 5:33 PM  Between 7am to 6pm - Pager - [phone]  After 6pm go to [email or web address] - password [email or web address] [facility]  [facility]  Office  [phone]  CC:  Primary care physician; Dicky Doe, MD

## 2015-10-30 NOTE — Discharge Instructions (Addendum)
Atrial Flutter °Atrial flutter is a heart rhythm that can cause the heart to beat very fast (tachycardia). It originates in the upper chambers of the heart (atria). In atrial flutter, the top chambers of the heart (atria) often beat much faster than the bottom chambers of the heart (ventricles). Atrial flutter has a regular "saw toothed" appearance in an EKG readout. An EKG is a test that records the electrical activity of the heart. Atrial flutter can cause the heart to beat up to 150 beats per minute (BPM). Atrial flutter can either be short lived (paroxysmal) or permanent.  °CAUSES  °Causes of atrial flutter can be many. Some of these include: °· Heart related issues: °¨ Heart attack (myocardial infarction). °¨ Heart failure. °¨ Heart valve problems. °¨ Poorly controlled high blood pressure (hypertension). °¨ After open heart surgery. °· Lung related issues: °¨ A blood clot in the lungs (pulmonary embolism). °¨ Chronic obstructive pulmonary disease (COPD). Medications used to treat COPD can attribute to atrial flutter. °· Other related causes: °¨ Hyperthyroidism. °¨ Caffeine. °¨ Some decongestant cold medications. °¨ Low electrolyte levels such as potassium or magnesium. °¨ Cocaine. °SYMPTOMS °· An awareness of your heart beating rapidly (palpitations). °· Shortness of breath. °· Chest pain. °· Low blood pressure (hypotension). °· Dizziness or fainting. °DIAGNOSIS  °Different tests can be performed to diagnose atrial flutter.  °· An EKG. °· Holter monitor. This is a 24-hour recording of your heart rhythm. You will also be given a diary. Write down all symptoms that you have and what you were doing at the time you experienced symptoms. °· Cardiac event monitor. This small device can be worn for up to 30 days. When you have heart symptoms, you will push a button on the device. This will then record your heart rhythm. °· Echocardiogram. This is an imaging test to look at your heart. Your caregiver will look at your  heart valves and the ventricles. °· Stress test. This test can help determine if the atrial flutter is related to exercise or if coronary artery disease is present. °· Laboratory studies will look at certain blood levels like: °¨ Complete blood count (CBC). °¨ Potassium. °¨ Magnesium. °¨ Thyroid function. °TREATMENT  °Treatment of atrial flutter varies. A combination of therapies may be used or sometimes atrial flutter may need only 1 type of treatment.  °Lab work: °If your blood work, such as your electrolytes (potassium, magnesium) or your thyroid function tests, are abnormal, your caregiver will treat them accordingly.  °Medication:  °There are several different types of medications that can convert your heart to a normal rhythm and prevent atrial flutter from reoccurring.  °Nonsurgical procedures: °Nonsurgical techniques may be used to control atrial flutter. Some examples include: °· Cardioversion. This technique uses either drugs or an electrical shock to restore a normal heart rhythm: °¨ Cardioversion drugs may be given through an intravenous (IV) line to help "reset" the heart rhythm. °¨ In electrical cardioversion, your caregiver shocks your heart with electrical energy. This helps to reset the heartbeat to a normal rhythm. °· Ablation. If atrial flutter is a persistent problem, an ablation may be needed. This procedure is done under mild sedation. High frequency radio-wave energy is used to destroy the area of heart tissue responsible for atrial flutter. °SEEK IMMEDIATE MEDICAL CARE IF:  °You have: °· Dizziness. °· Near fainting or fainting. °· Shortness of breath. °· Chest pain or pressure. °· Sudden nausea or vomiting. °· Profuse sweating. °If you have the above symptoms,   call your local emergency service immediately! Do not drive yourself to the hospital. MAKE SURE YOU:   Understand these instructions.  Will watch your condition.  Will get help right away if you are not doing well or get worse.    This information is not intended to replace advice given to you by your health care provider. Make sure you discuss any questions you have with your health care provider.   Document Released: 12/26/2008 Document Revised: 08/30/2014 Document Reviewed: 02/21/2015 Elsevier Interactive Patient Education 2016 [employer].   Dr Ubaldo Glassing will set you up with an outpatient Holter monitor, his office will call you   Information on my medicine - ELIQUIS (apixaban)  This medication education was reviewed with me or my healthcare representative as part of my discharge preparation.  The pharmacist that spoke with me during my hospital stay was:  Ramond Dial, [facility]  Why was Eliquis prescribed for you? Eliquis was prescribed for you to reduce the risk of a blood clot forming that can cause a stroke if you have a medical condition called atrial fibrillation (a type of irregular heartbeat).  What do You need to know about Eliquis ? Take your Eliquis TWICE DAILY - one tablet in the morning and one tablet in the evening with or without food. If you have difficulty swallowing the tablet whole please discuss with your pharmacist how to take the medication safely.  Take Eliquis exactly as prescribed by your doctor and DO NOT stop taking Eliquis without talking to the doctor who prescribed the medication.  Stopping may increase your risk of developing a stroke.  Refill your prescription before you run out.  After discharge, you should have regular check-up appointments with your healthcare provider that is prescribing your Eliquis.  In the future your dose may need to be changed if your kidney function or weight changes by a significant amount or as you get older.  What do you do if you miss a dose? If you miss a dose, take it as soon as you remember on the same day and resume taking twice daily.  Do not take more than one dose of ELIQUIS at the same time to make up a missed dose.  Important Safety  Information A possible side effect of Eliquis is bleeding. You should call your healthcare provider right away if you experience any of the following: ? Bleeding from an injury or your nose that does not stop. ? Unusual colored urine (red or dark brown) or unusual colored stools (red or black). ? Unusual bruising for unknown reasons. ? A serious fall or if you hit your head (even if there is no bleeding).  Some medicines may interact with Eliquis and might increase your risk of bleeding or clotting while on Eliquis. To help avoid this, consult your healthcare provider or pharmacist prior to using any new prescription or non-prescription medications, including herbals, vitamins, non-steroidal anti-inflammatory drugs (NSAIDs) and supplements.  This website has more information on Eliquis (apixaban): http://www.eliquis.com/eliquis/home

## 2015-10-30 NOTE — Progress Notes (Signed)
Physical Therapy Treatment Patient Details Name: Kristin [name] MRN: [id number] DOB: [birth year] Today's Date: 10/30/2015    History of Present Illness Patient is an 82 y/o female that presents after several episodes of light headed/diaphoretic states. Neurologic work up thus far has been negative for acute changes.     PT Comments    Pt eager to be discharged, but agreeable to work with PT. PT noted to ambulate in general fairly well; however, it is noted that pt has poor clearance of B feet with swing phase inconsistently, but more often than not. Pt mentioned she has chronic back pain. Discussed option of using a rolling walker to alleviate stress on back and allow for safer clearance of feet. Pt states she know it would help her, but refuses trial or obtaining/using a rolling walker at this time. Pt states she uses a single point cane when needed and wishes to continue as such for now. Post longer ambulation, pt up in chair then notes she needs to use the bathroom. Pt does so without assist and distant supervision of ambulation. Pt performs independently. Pt returns to chair. Pt may benefit from outpatient PT to address strength and mobility needs. Continue PT this admission for improved quality of ambulation.   Follow Up Recommendations  Outpatient PT     Equipment Recommendations  Other (comment) (would recommend rw; pt does not wish at this time)    Recommendations for Other Services       Precautions / Restrictions Precautions Precautions: Fall Restrictions Weight Bearing Restrictions: No    Mobility  Bed Mobility               General bed mobility comments: Not tested; sitting edge of bed upon arrival   Transfers Overall transfer level: Independent Equipment used: None             General transfer comment: Uses hands safely from bed, chair and commode  Ambulation/Gait Ambulation/Gait assistance: Supervision;Independent Ambulation Distance (Feet): 250 Feet  (also ambulates to/from bathroom total 50 ft distant S/I) Assistive device: None Gait Pattern/deviations: Step-through pattern (decreased clearance of B feet more consistently than not)   Gait velocity interpretation: at or above normal speed for age/gender (pt states she is ambulating at her baseline) General Gait Details: Pt ambulates with SBA without AD without LOB; pt does hovwever have decreased clearance of B feet more times than not sliding plantar aspect along floor. Discussed use of rw due to this and chronic back pain. Pt refuses idea of a rw at this time and notes she uses a SPC as needed at home.    Stairs            Wheelchair Mobility    Modified Rankin (Stroke Patients Only)       Balance     Sitting balance-Leahy Scale: Normal     Standing balance support: No upper extremity supported Standing balance-Leahy Scale: Good                      Cognition Arousal/Alertness: Awake/alert Behavior During Therapy: WFL for tasks assessed/performed Overall Cognitive Status: Within Functional Limits for tasks assessed                      Exercises      General Comments        Pertinent Vitals/Pain Pain Assessment: No/denies pain    Home Living  Prior Function            PT Goals (current goals can now be found in the care plan section) Progress towards PT goals: Progressing toward goals    Frequency  Min 2X/week    PT Plan Current plan remains appropriate    Co-evaluation             End of Session Equipment Utilized During Treatment: Gait belt Activity Tolerance: Patient tolerated treatment well Patient left: in chair;with call bell/phone within reach (did not wish alarm on chair)     Time: 7276-1848 PT Time Calculation (min) (ACUTE ONLY): 23 min  Charges:  $Gait Training: 23-37 mins                    G CodesCharlaine Dalton, PTA 10/30/2015, 3:15 PM

## 2015-10-30 NOTE — Progress Notes (Signed)
[place]  SUBJECTIVE: No complaints. Wants to go home. Bradycardia and puase last pm noted. Asymptomatic   Filed Vitals:   10/29/15 2005 10/30/15 0415 10/30/15 0417 10/30/15 0418  BP: 145/68 138/83 139/79 111/63  Pulse: 79 71 41 52  Temp: 98.8 F (37.1 C) 98.7 F (37.1 C)    TempSrc: Oral Oral    Resp:  18    Height:      Weight:      SpO2: 93% 95%      Intake/Output Summary (Last 24 hours) at 10/30/15 0905 Last data filed at 10/30/15 4401  Gross per 24 hour  Intake  861.5 ml  Output   2400 ml  Net -1538.5 ml    LABS: Basic Metabolic Panel:  Recent Labs  10/28/15 2259 10/29/15 0357  NA 142 141  K 4.4 5.0  CL 109 111  CO2 26 21*  GLUCOSE 113* 88  BUN 27* 27*  CREATININE 1.51* 1.25*  CALCIUM 10.0 9.3   Liver Function Tests: No results for input(s): AST, ALT, ALKPHOS, BILITOT, PROT, ALBUMIN in the last 72 hours. No results for input(s): LIPASE, AMYLASE in the last 72 hours. CBC:  Recent Labs  10/28/15 2259  WBC 7.2  HGB 14.2  HCT 42.5  MCV 91.0  PLT 175   Cardiac Enzymes:  Recent Labs  10/29/15 0357 10/29/15 0839 10/29/15 1435  TROPONINI <0.03 <0.03 <0.03   BNP: Invalid input(s): POCBNP D-Dimer: No results for input(s): DDIMER in the last 72 hours. Hemoglobin A1C: No results for input(s): HGBA1C in the last 72 hours. Fasting Lipid Panel: No results for input(s): CHOL, HDL, LDLCALC, TRIG, CHOLHDL, LDLDIRECT in the last 72 hours. Thyroid Function Tests:  Recent Labs  10/29/15 0839  TSH 2.033   Anemia Panel: No results for input(s): VITAMINB12, FOLATE, FERRITIN, TIBC, IRON, RETICCTPCT in the last 72 hours.   Physical Exam: Blood pressure 111/63, pulse 52, temperature 98.7 F (37.1 C), temperature source Oral, resp. rate 18, height '5\' 9"'$  (1.753 m), weight 78.291 kg (172 lb 9.6 oz), SpO2 95 %.   Wt Readings from Last 1 Encounters:  10/29/15 78.291 kg (172 lb 9.6 oz)     General appearance:  alert and cooperative Resp: clear to auscultation bilaterally Cardio: irregularly irregular rhythm GI: soft, non-tender; bowel sounds normal; no masses,  no organomegaly Neurologic: Grossly normal  TELEMETRY: Reviewed telemetry pt in aflutter with variable but slow vr. 2.4 second pause last pm. Asymptomatic:  ASSESSMENT AND PLAN:  Principal Problem:   Near syncope Active Problems:   Atrial flutter (HCC)-aflutter with slow vr. Asymptomatic at present. On eliquis. OK for discharge on eliquis and lisinopril. 48 Hour holter to be placed at discharge. Follow up in 1 week or less with Dr. Saralyn Pilar.    UTI (lower urinary tract infection)    Teodoro Spray., MD, [facility] 10/30/2015 9:05 AM

## 2015-10-30 NOTE — Care Management (Signed)
Patient for discharge home today on Eliquis.  She verbalizes has medication plan with her medicare policy.  She denies needing any home health nursing follow up.

## 2015-10-30 NOTE — Progress Notes (Signed)
Pt heart rate decreased into mid 30s. Pt asymptomatic and resting in bed. Pt had 2.16 second pause during this time as well. MD notified. Acknowledged. New orders placed. Will continue to monitor,

## 2015-10-30 NOTE — Progress Notes (Signed)
Pt discharged home, IV site DC, bleding controlled, tele monitor turned in, patient given instruction re eliquis, keflex, a flutter, UTI, understanding verbalized, scrips given to patient, she left hte hospital with her granddaughter.

## 2015-11-09 DIAGNOSIS — J45998 Other asthma: Secondary | ICD-10-CM | POA: Diagnosis not present

## 2015-11-10 ENCOUNTER — Telehealth: Payer: Self-pay

## 2015-11-10 NOTE — Telephone Encounter (Signed)
Pt wants to know since she is taking aspirin should she still come for procedure tomorrow 3/22 please call pt back today Dr Dossie Arbour pt

## 2015-11-10 NOTE — Telephone Encounter (Signed)
Attempted to call patient, phone number not valid.

## 2015-11-11 ENCOUNTER — Ambulatory Visit: Payer: Medicare Other | Attending: Pain Medicine | Admitting: Pain Medicine

## 2015-11-11 ENCOUNTER — Encounter: Payer: Self-pay | Admitting: Pain Medicine

## 2015-11-11 VITALS — BP 133/80 | HR 92 | Temp 98.0°F | Resp 16 | Ht 69.0 in | Wt 175.0 lb

## 2015-11-11 DIAGNOSIS — M549 Dorsalgia, unspecified: Secondary | ICD-10-CM | POA: Diagnosis present

## 2015-11-11 DIAGNOSIS — I129 Hypertensive chronic kidney disease with stage 1 through stage 4 chronic kidney disease, or unspecified chronic kidney disease: Secondary | ICD-10-CM | POA: Diagnosis not present

## 2015-11-11 DIAGNOSIS — R7309 Other abnormal glucose: Secondary | ICD-10-CM | POA: Insufficient documentation

## 2015-11-11 DIAGNOSIS — Z853 Personal history of malignant neoplasm of breast: Secondary | ICD-10-CM | POA: Diagnosis not present

## 2015-11-11 DIAGNOSIS — R7303 Prediabetes: Secondary | ICD-10-CM | POA: Diagnosis not present

## 2015-11-11 DIAGNOSIS — M539 Dorsopathy, unspecified: Secondary | ICD-10-CM

## 2015-11-11 DIAGNOSIS — M47896 Other spondylosis, lumbar region: Secondary | ICD-10-CM | POA: Diagnosis not present

## 2015-11-11 DIAGNOSIS — M5137 Other intervertebral disc degeneration, lumbosacral region: Secondary | ICD-10-CM | POA: Insufficient documentation

## 2015-11-11 DIAGNOSIS — M489 Spondylopathy, unspecified: Secondary | ICD-10-CM

## 2015-11-11 DIAGNOSIS — K5909 Other constipation: Secondary | ICD-10-CM | POA: Diagnosis not present

## 2015-11-11 DIAGNOSIS — M961 Postlaminectomy syndrome, not elsewhere classified: Secondary | ICD-10-CM | POA: Insufficient documentation

## 2015-11-11 DIAGNOSIS — R350 Frequency of micturition: Secondary | ICD-10-CM | POA: Diagnosis not present

## 2015-11-11 DIAGNOSIS — I509 Heart failure, unspecified: Secondary | ICD-10-CM | POA: Insufficient documentation

## 2015-11-11 DIAGNOSIS — G8929 Other chronic pain: Secondary | ICD-10-CM | POA: Diagnosis not present

## 2015-11-11 DIAGNOSIS — I341 Nonrheumatic mitral (valve) prolapse: Secondary | ICD-10-CM | POA: Insufficient documentation

## 2015-11-11 DIAGNOSIS — Z90710 Acquired absence of both cervix and uterus: Secondary | ICD-10-CM | POA: Diagnosis not present

## 2015-11-11 DIAGNOSIS — Z79891 Long term (current) use of opiate analgesic: Secondary | ICD-10-CM | POA: Diagnosis not present

## 2015-11-11 DIAGNOSIS — Z8614 Personal history of Methicillin resistant Staphylococcus aureus infection: Secondary | ICD-10-CM | POA: Diagnosis not present

## 2015-11-11 DIAGNOSIS — M4310 Spondylolisthesis, site unspecified: Secondary | ICD-10-CM

## 2015-11-11 DIAGNOSIS — I4892 Unspecified atrial flutter: Secondary | ICD-10-CM | POA: Insufficient documentation

## 2015-11-11 DIAGNOSIS — M47816 Spondylosis without myelopathy or radiculopathy, lumbar region: Secondary | ICD-10-CM | POA: Insufficient documentation

## 2015-11-11 DIAGNOSIS — M545 Low back pain: Secondary | ICD-10-CM | POA: Diagnosis not present

## 2015-11-11 DIAGNOSIS — B1920 Unspecified viral hepatitis C without hepatic coma: Secondary | ICD-10-CM | POA: Insufficient documentation

## 2015-11-11 DIAGNOSIS — M810 Age-related osteoporosis without current pathological fracture: Secondary | ICD-10-CM | POA: Insufficient documentation

## 2015-11-11 DIAGNOSIS — Z87891 Personal history of nicotine dependence: Secondary | ICD-10-CM | POA: Diagnosis not present

## 2015-11-11 DIAGNOSIS — Z5181 Encounter for therapeutic drug level monitoring: Secondary | ICD-10-CM

## 2015-11-11 DIAGNOSIS — G9619 Other disorders of meninges, not elsewhere classified: Secondary | ICD-10-CM | POA: Diagnosis not present

## 2015-11-11 DIAGNOSIS — M75122 Complete rotator cuff tear or rupture of left shoulder, not specified as traumatic: Secondary | ICD-10-CM | POA: Diagnosis not present

## 2015-11-11 DIAGNOSIS — I5032 Chronic diastolic (congestive) heart failure: Secondary | ICD-10-CM | POA: Insufficient documentation

## 2015-11-11 DIAGNOSIS — M4316 Spondylolisthesis, lumbar region: Secondary | ICD-10-CM | POA: Insufficient documentation

## 2015-11-11 DIAGNOSIS — J069 Acute upper respiratory infection, unspecified: Secondary | ICD-10-CM | POA: Insufficient documentation

## 2015-11-11 DIAGNOSIS — E559 Vitamin D deficiency, unspecified: Secondary | ICD-10-CM | POA: Insufficient documentation

## 2015-11-11 DIAGNOSIS — M4806 Spinal stenosis, lumbar region: Secondary | ICD-10-CM | POA: Diagnosis not present

## 2015-11-11 DIAGNOSIS — M109 Gout, unspecified: Secondary | ICD-10-CM | POA: Insufficient documentation

## 2015-11-11 DIAGNOSIS — J449 Chronic obstructive pulmonary disease, unspecified: Secondary | ICD-10-CM | POA: Diagnosis not present

## 2015-11-11 MED ORDER — OXYCODONE HCL 10 MG PO TABS: 10.0000 mg | ORAL_TABLET | Freq: Three times a day (TID) | ORAL | Status: DC | PRN

## 2015-11-11 NOTE — Patient Instructions (Signed)
GENERAL RISKS AND COMPLICATIONS  What are the risk, side effects and possible complications? Generally speaking, most procedures are safe.  However, with any procedure there are risks, side effects, and the possibility of complications.  The risks and complications are dependent upon the sites that are lesioned, or the type of nerve block to be performed.  The closer the procedure is to the spine, the more serious the risks are.  Great care is taken when placing the radio frequency needles, block needles or lesioning probes, but sometimes complications can occur. 1. Infection: Any time there is an injection through the skin, there is a risk of infection.  This is why sterile conditions are used for these blocks.  There are four possible types of infection. 1. Localized skin infection. 2. Central Nervous System Infection-This can be in the form of Meningitis, which can be deadly. 3. Epidural Infections-This can be in the form of an epidural abscess, which can cause pressure inside of the spine, causing compression of the spinal cord with subsequent paralysis. This would require an emergency surgery to decompress, and there are no guarantees that the patient would recover from the paralysis. 4. Discitis-This is an infection of the intervertebral discs.  It occurs in about 1% of discography procedures.  It is difficult to treat and it may lead to surgery.        2. Pain: the needles have to go through skin and soft tissues, will cause soreness.       3. Damage to internal structures:  The nerves to be lesioned may be near blood vessels or    other nerves which can be potentially damaged.       4. Bleeding: Bleeding is more common if the patient is taking blood thinners such as  aspirin, Coumadin, Ticiid, Plavix, etc., or if he/she have some genetic predisposition  such as hemophilia. Bleeding into the spinal canal can cause compression of the spinal  cord with subsequent paralysis.  This would require an  emergency surgery to  decompress and there are no guarantees that the patient would recover from the  paralysis.       5. Pneumothorax:  Puncturing of a lung is a possibility, every time a needle is introduced in  the area of the chest or upper back.  Pneumothorax refers to free air around the  collapsed lung(s), inside of the thoracic cavity (chest cavity).  Another two possible  complications related to a similar event would include: Hemothorax and Chylothorax.   These are variations of the Pneumothorax, where instead of air around the collapsed  lung(s), you may have blood or chyle, respectively.       6. Spinal headaches: They may occur with any procedures in the area of the spine.       7. Persistent CSF (Cerebro-Spinal Fluid) leakage: This is a rare problem, but may occur  with prolonged intrathecal or epidural catheters either due to the formation of a fistulous  track or a dural tear.       8. Nerve damage: By working so close to the spinal cord, there is always a possibility of  nerve damage, which could be as serious as a permanent spinal cord injury with  paralysis.       9. Death:  Although rare, severe deadly allergic reactions known as "Anaphylactic  reaction" can occur to any of the medications used.      10. Worsening of the symptoms:  We can always make thing worse.    What are the chances of something like this happening? Chances of any of this occuring are extremely low.  By statistics, you have more of a chance of getting killed in a motor vehicle accident: while driving to the hospital than any of the above occurring .  Nevertheless, you should be aware that they are possibilities.  In general, it is similar to taking a shower.  Everybody knows that you can slip, hit your head and get killed.  Does that mean that you should not shower again?  Nevertheless always keep in mind that statistics do not mean anything if you happen to be on the wrong side of them.  Even if a procedure has a 1  (one) in a 1,000,000 (million) chance of going wrong, it you happen to be that one..Also, keep in mind that by statistics, you have more of a chance of having something go wrong when taking medications.  Who should not have this procedure? If you are on a blood thinning medication (e.g. Coumadin, Plavix, see list of "Blood Thinners"), or if you have an active infection going on, you should not have the procedure.  If you are taking any blood thinners, please inform your physician.  How should I prepare for this procedure?  Do not eat or drink anything at least six hours prior to the procedure.  Bring a driver with you .  It cannot be a taxi.  Come accompanied by an adult that can drive you back, and that is strong enough to help you if your legs get weak or numb from the local anesthetic.  Take all of your medicines the morning of the procedure with just enough water to swallow them.  If you have diabetes, make sure that you are scheduled to have your procedure done first thing in the morning, whenever possible.  If you have diabetes, take only half of your insulin dose and notify our nurse that you have done so as soon as you arrive at the clinic.  If you are diabetic, but only take blood sugar pills (oral hypoglycemic), then do not take them on the morning of your procedure.  You may take them after you have had the procedure.  Do not take aspirin or any aspirin-containing medications, at least eleven (11) days prior to the procedure.  They may prolong bleeding.  Wear loose fitting clothing that may be easy to take off and that you would not mind if it got stained with Betadine or blood.  Do not wear any jewelry or perfume  Remove any nail coloring.  It will interfere with some of our monitoring equipment.  NOTE: Remember that this is not meant to be interpreted as a complete list of all possible complications.  Unforeseen problems may occur.  BLOOD THINNERS The following drugs  contain aspirin or other products, which can cause increased bleeding during surgery and should not be taken for 2 weeks prior to and 1 week after surgery.  If you should need take something for relief of minor pain, you may take acetaminophen which is found in Tylenol,m Datril, Anacin-3 and Panadol. It is not blood thinner. The products listed below are.  Do not take any of the products listed below in addition to any listed on your instruction sheet.  A.P.C or A.P.C with Codeine Codeine Phosphate Capsules #3 Ibuprofen Ridaura  ABC compound Congesprin Imuran rimadil  Advil Cope Indocin Robaxisal  Alka-Seltzer Effervescent Pain Reliever and Antacid Coricidin or Coricidin-D  Indomethacin Rufen    Alka-Seltzer plus Cold Medicine Cosprin Ketoprofen S-A-C Tablets  Anacin Analgesic Tablets or Capsules Coumadin Korlgesic Salflex  Anacin Extra Strength Analgesic tablets or capsules CP-2 Tablets Lanoril Salicylate  Anaprox Cuprimine Capsules Levenox Salocol  Anexsia-D Dalteparin Magan Salsalate  Anodynos Darvon compound Magnesium Salicylate Sine-off  Ansaid Dasin Capsules Magsal Sodium Salicylate  Anturane Depen Capsules Marnal Soma  APF Arthritis pain formula Dewitt's Pills Measurin Stanback  Argesic Dia-Gesic Meclofenamic Sulfinpyrazone  Arthritis Bayer Timed Release Aspirin Diclofenac Meclomen Sulindac  Arthritis pain formula Anacin Dicumarol Medipren Supac  Analgesic (Safety coated) Arthralgen Diffunasal Mefanamic Suprofen  Arthritis Strength Bufferin Dihydrocodeine Mepro Compound Suprol  Arthropan liquid Dopirydamole Methcarbomol with Aspirin Synalgos  ASA tablets/Enseals Disalcid Micrainin Tagament  Ascriptin Doan's Midol Talwin  Ascriptin A/D Dolene Mobidin Tanderil  Ascriptin Extra Strength Dolobid Moblgesic Ticlid  Ascriptin with Codeine Doloprin or Doloprin with Codeine Momentum Tolectin  Asperbuf Duoprin Mono-gesic Trendar  Aspergum Duradyne Motrin or Motrin IB Triminicin  Aspirin  plain, buffered or enteric coated Durasal Myochrisine Trigesic  Aspirin Suppositories Easprin Nalfon Trillsate  Aspirin with Codeine Ecotrin Regular or Extra Strength Naprosyn Uracel  Atromid-S Efficin Naproxen Ursinus  Auranofin Capsules Elmiron Neocylate Vanquish  Axotal Emagrin Norgesic Verin  Azathioprine Empirin or Empirin with Codeine Normiflo Vitamin E  Azolid Emprazil Nuprin Voltaren  Bayer Aspirin plain, buffered or children's or timed BC Tablets or powders Encaprin Orgaran Warfarin Sodium  Buff-a-Comp Enoxaparin Orudis Zorpin  Buff-a-Comp with Codeine Equegesic Os-Cal-Gesic   Buffaprin Excedrin plain, buffered or Extra Strength Oxalid   Bufferin Arthritis Strength Feldene Oxphenbutazone   Bufferin plain or Extra Strength Feldene Capsules Oxycodone with Aspirin   Bufferin with Codeine Fenoprofen Fenoprofen Pabalate or Pabalate-SF   Buffets II Flogesic Panagesic   Buffinol plain or Extra Strength Florinal or Florinal with Codeine Panwarfarin   Buf-Tabs Flurbiprofen Penicillamine   Butalbital Compound Four-way cold tablets Penicillin   Butazolidin Fragmin Pepto-Bismol   Carbenicillin Geminisyn Percodan   Carna Arthritis Reliever Geopen Persantine   Carprofen Gold's salt Persistin   Chloramphenicol Goody's Phenylbutazone   Chloromycetin Haltrain Piroxlcam   Clmetidine heparin Plaquenil   Cllnoril Hyco-pap Ponstel   Clofibrate Hydroxy chloroquine Propoxyphen         Before stopping any of these medications, be sure to consult the physician who ordered them.  Some, such as Coumadin (Warfarin) are ordered to prevent or treat serious conditions such as "deep thrombosis", "pumonary embolisms", and other heart problems.  The amount of time that you may need off of the medication may also vary with the medication and the reason for which you were taking it.  If you are taking any of these medications, please make sure you notify your pain physician before you undergo any  procedures.         Facet Blocks Patient Information  Description: The facets are joints in the spine between the vertebrae.  Like any joints in the body, facets can become irritated and painful.  Arthritis can also effect the facets.  By injecting steroids and local anesthetic in and around these joints, we can temporarily block the nerve supply to them.  Steroids act directly on irritated nerves and tissues to reduce selling and inflammation which often leads to decreased pain.  Facet blocks may be done anywhere along the spine from the neck to the low back depending upon the location of your pain.   After numbing the skin with local anesthetic (like Novocaine), a small needle is passed onto the facet joints under x-ray guidance.    You may experience a sensation of pressure while this is being done.  The entire block usually lasts about 15-25 minutes.   Conditions which may be treated by facet blocks:   Low back/buttock pain  Neck/shoulder pain  Certain types of headaches  Preparation for the injection:  1. Do not eat any solid food or dairy products within 8 hours of your appointment. 2. You may drink clear liquid up to 3 hours before appointment.  Clear liquids include water, black coffee, juice or soda.  No milk or cream please. 3. You may take your regular medication, including pain medications, with a sip of water before your appointment.  Diabetics should hold regular insulin (if taken separately) and take 1/2 normal NPH dose the morning of the procedure.  Carry some sugar containing items with you to your appointment. 4. A driver must accompany you and be prepared to drive you home after your procedure. 5. Bring all your current medications with you. 6. An IV may be inserted and sedation may be given at the discretion of the physician. 7. A blood pressure cuff, EKG and other monitors will often be applied during the procedure.  Some patients may need to have extra oxygen  administered for a short period. 8. You will be asked to provide medical information, including your allergies and medications, prior to the procedure.  We must know immediately if you are taking blood thinners (like Coumadin/Warfarin) or if you are allergic to IV iodine contrast (dye).  We must know if you could possible be pregnant.  Possible side-effects:   Bleeding from needle site  Infection (rare, may require surgery)  Nerve injury (rare)  Numbness & tingling (temporary)  Difficulty urinating (rare, temporary)  Spinal headache (a headache worse with upright posture)  Light-headedness (temporary)  Pain at injection site (serveral days)  Decreased blood pressure (rare, temporary)  Weakness in arm/leg (temporary)  Pressure sensation in back/neck (temporary)   Call if you experience:   Fever/chills associated with headache or increased back/neck pain  Headache worsened by an upright position  New onset, weakness or numbness of an extremity below the injection site  Hives or difficulty breathing (go to the emergency room)  Inflammation or drainage at the injection site(s)  Severe back/neck pain greater than usual  New symptoms which are concerning to you  Please note:  Although the local anesthetic injected can often make your back or neck feel good for several hours after the injection, the pain will likely return. It takes 3-7 days for steroids to work.  You may not notice any pain relief for at least one week.  If effective, we will often do a series of 2-3 injections spaced 3-6 weeks apart to maximally decrease your pain.  After the initial series, you may be a candidate for a more permanent nerve block of the facets.  If you have any questions, please call [phone] [facility] 

## 2015-11-11 NOTE — Progress Notes (Signed)
Patient presents today for procedure d/t low back pain.  Procedure has to be put on hold d/t patient is taking blood thinner after being evaluated in ED this past week.  Patient is scheduled for further evaluation with Dr Saralyn Pilar on 11/21/15.

## 2015-11-11 NOTE — Progress Notes (Signed)
Patient's Name: Kristin [name] MRN: [id number] DOB: August 20, 1930 DOS: 11/11/2015  Primary Reason(s) for Visit: Encounter for Medication Management CC: Back Pain   HPI  Kristin [name] is a 82 y.o. year old, female patient, who returns today as an established patient. She has Abnormal glucose tolerance test; Chronic diastolic heart failure ([place]); Chronic kidney disease (CKD), stage III (moderate); CAFL (chronic airflow limitation) ([place]); Complete rotator cuff rupture of shoulder (Left); CCF (congestive cardiac failure) (HCC); DDD (degenerative disc disease), lumbosacral; Gout; Essential (primary) hypertension; Benign essential HTN; BP (high blood pressure); H/O cardiac catheterization; Arrhythmia, sinus node ([place]); Upper respiratory infection, viral; Lumbar canal stenosis; Urinary frequency; Chronic obstructive pulmonary disease ([place]); Congestive heart failure ([place]); Degeneration of intervertebral disc of lumbosacral region; 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[place]); History of cardiac catheterization; Sinoatrial node dysfunction ([place]); Need for influenza vaccination; Frequency of urination; Seasonal allergies; Breast cancer, right breast ([place]); Chronic pain; Chronic pain syndrome; Chronic low back pain (Location of Primary Source of Pain) (Bilateral) (L>R); Lumbar facet syndrome (Bilateral) (L>R); Lumbar spondylosis; Chronic lumbar radicular pain (Bilateral) (L>R) (L5 [place]erm[place]); Restless leg syndrome; Myofascial pain; Neuropathic pain; Neurogenic pain; Long term current use of opiate analgesic; Long term prescription opiate use; Opiate use; Opiate dependence ([place]); Encounter for therapeutic drug level monitoring; Chronic constipation; Therapeutic opioid-induced constipation (OIC); Vitamin D insufficiency; Grade 1 Retrolisthesis of L1 over L2; Lumbar facet hypertrophy (L1-2, L2-3, and L4-5); Stenosis of lateral recess of lumbar spine (L1-2 and L4-5); 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 Her primarily concern today is the Back Pain   The patient comes into the clinics today hoping to have a bilateral lumbar facet block under fluoroscopic guidance and IV sedation. However, in reviewing her chart I have noticed that on 10/28/2015 she was in the emergency department to cardiac problems. She was recently placed on Elaquis and she did not stop it. At this point, we have decided to consult her cardiologist, Dr. Josefa Half to see if she can come off of the blood thinner for these type procedures. Meanwhile, today we have taken the opportunity to use this as a medication management evaluation. As it turns out her very last prescription that I wrote for her I made it for 120 tablets instead of the 180 that I normally prescribe for her. She brought this prescription back and we have destroyed it and given her some new ones.   Pain Assessment: Self-Reported Pain Score: 7 , clinically she looks like a 2-3/10. Reported level is compatible with observation Pain Type: Chronic pain Pain Location: Back Pain Orientation: Lower Pain Descriptors / Indicators: Constant, Aching Pain Frequency: Constant  Date of Last Visit: 09/24/15 Service Provided on Last Visit: Med Refill  Controlled Substance Pharmacotherapy Assessment  Analgesic: Oxycodone IR 20 mg every 8 hours (60 mg/day) Pill Count: The patient failed to bring her medications to this appointment. MME/day: 90 mg/day Pharmacokinetics: Onset of action (Liberation/Absorption): Within expected pharmacological parameters Time to  Peak effect (Distribution): Timing and results are as within normal expected parameters Duration of action (Metabolism/Excretion): Within normal limits for medication Pharmacodynamics: Analgesic Effect: More than 50% Activity Facilitation: Medication(s) allow patient to sit, stand, walk, and do the basic ADLs  Perceived Effectiveness: Described as relatively effective, allowing for increase in activities of daily living (ADL) Side-effects or Adverse reactions: None reported Monitoring: [place] PMP: Compliant with practice rules and regulations UDS Results/interpretation: The patient's last UDS was done on 09/24/2015 and it came back within normal limits with no unexpected results. Medication Assessment Form: Reviewed. Patient indicates being compliant with therapy Treatment compliance: Compliant Risk Assessment: Aberrant Behavior: None observed today and     Substance Use Disorder (SUD) Risk Level: Low Opioid Risk Tool (ORT) Score: Total Score: 3 Low Risk for SUD (Score <3) Depression Scale Score: PHQ-2: PHQ-2 Total Score: 0 No depression (0) PHQ-9: PHQ-9 Total Score: 0 No depression (0-4)  Pharmacologic Plan: No change in therapy, at this time   Laboratory Workup  Last ED UDS: No results found for: THCU, COCAINSCRNUR, PCPSCRNUR, MDMA, AMPHETMU, METHADONE, ETOH  Inflammation Markers Lab Results  Component Value Date   ESRSEDRATE 12 09/24/2015   CRP <0.5 09/24/2015    Renal Function Lab Results  Component Value Date   BUN 27* 10/29/2015   CREATININE 1.25* 10/29/2015   GFRAA 44* 10/29/2015   GFRNONAA 38* 10/29/2015    Hepatic Function Lab Results  Component Value Date   AST 24 09/24/2015   ALT 15 09/24/2015   ALBUMIN 4.0 09/24/2015    Electrolytes Lab Results  Component Value Date   NA 141 10/29/2015   K 5.0 10/29/2015   CL 111 10/29/2015   CALCIUM 9.3 10/29/2015   MG 2.2 09/24/2015    Allergies  Kristin [name] is allergic to cyclobenzaprine; flexeril; and  vancomycin.  Meds  The patient has a current medication list which includes the following prescription(s): albuterol, apixaban, bisacodyl, calcium citrate-vitamin d, vitamin d3, furosemide, levocetirizine, lisinopril, magnesium oxide, omeprazole, ropinirole, benefiber, cephalexin, oxycodone hcl, oxycodone hcl, and oxycodone hcl.  Current Outpatient Prescriptions on File Prior to Visit  Medication Sig  . albuterol (PROVENTIL) (2.5 MG/3ML) 0.083% nebulizer solution Take 3 mLs (2.5 mg total) by nebulization every 8 (eight) hours as needed. Shortness of breath and wheezing  . apixaban (ELIQUIS) 5 MG TABS tablet Take 1 tablet (5 mg total) by mouth 2 (two) times daily.  . bisacodyl (DULCOLAX) 5 MG EC tablet Take 2 tablets (10 mg total) by mouth daily as needed for moderate constipation.  . calcium citrate-vitamin D (CITRACAL+D) 315-200 MG-UNIT per tablet Take 1 tablet by mouth 2 (two) times daily.  . Cholecalciferol (VITAMIN D3) 2000 units TABS Take 1 tablet by mouth daily.  . furosemide (LASIX) 20 MG tablet Take 1 tablet (20 mg total) by mouth daily.  Marland Kitchen levocetirizine (XYZAL) 5 MG tablet Take 1 tablet (5 mg total) by mouth every evening.  Marland Kitchen lisinopril (PRINIVIL,ZESTRIL) 10 MG tablet Take 10 mg by mouth daily.  . magnesium oxide (MAG-OX) 400 MG tablet Take 1 tablet (400 mg total) by mouth daily.  Marland Kitchen omeprazole (PRILOSEC) 20 MG capsule Take 20 mg by mouth daily.  Marland Kitchen rOPINIRole (REQUIP) 2 MG tablet Take 1 tablet (2 mg total) by mouth at bedtime.  . Wheat Dextrin (BENEFIBER) POWD Stir 2 teaspoons of Benefiber into 4-8 oz of any non-carbonated beverage or soft food (hot or cold) PO TID  . cephALEXin (KEFLEX) 500 MG capsule Take 1 capsule (500 mg total) by mouth 3 (three) times daily. (Patient not taking: Reported on 11/11/2015)   No current facility-administered medications on file prior to visit.    ROS  Constitutional: Afebrile, no chills, well hydrated and well nourished Gastrointestinal:  negative Musculoskeletal:negative Neurological: negative  Behavioral/Psych: negative  PFSH  Medical:  Kristin [name]  has a past medical history of Hypertension; Hyperlipidemia; COPD (chronic obstructive pulmonary disease) ([place]); Arthritis; Chronic back pain; CHF (congestive heart failure) ([place]); Hepatitis C; Gout; Arthritis; CKD (chronic kidney disease); OAB (overactive bladder); UTI (lower urinary tract infection); Cystocele; Vaginal atrophy; Bradycardia; Hematuria; Urinary frequency; Hypomagnesemia (06/25/2015); and Cancer ([place]) (2004). Family: family history includes Breast cancer in her paternal aunt; Breast cancer (age of onset: 10) in her sister; Cancer in her sister; Heart disease in her father and mother; Stroke in her mother. There is no history of Anesthesia problems, Hypotension, Malignant hyperthermia, or Pseudochol deficiency. Surgical:  has past surgical history that includes Abdominal hysterectomy; Joint replacement; Back surgery; Rotator cuff repair; Breast surgery; Cataract extraction w/PHACO (10/19/2011); Cataract extraction w/PHACO (11/02/2011); and Carpal tunnel release. Tobacco:  reports that she has quit smoking. Her smoking use included Cigarettes. She has a 50 pack-year smoking history. She does not have any smokeless tobacco history on file. Alcohol:  reports that she does not drink alcohol. Drug:  reports that she does not use illicit drugs.  Physical Exam  Vitals:  Today's Vitals   11/11/15 0957 11/11/15 1006  BP: 137/122 133/80  Pulse: 111 92  Temp: 98 F (36.7 C)   TempSrc: Oral   Resp: 16 16  Height: '5\' 9"'$  (1.753 m)   Weight: 175 lb (79.379 kg)   SpO2: 94%   PainSc: 7      Calculated BMI: Body mass index is 25.83 kg/(m^2).     General appearance: alert, cooperative, appears stated age and mild distress Eyes: PERLA Respiratory: No evidence respiratory distress, no audible rales or ronchi and no use of accessory muscles of respiration  Cervical  Spine Inspection: Normal anatomy Alignment: Symetrical ROM: Adequate  Upper Extremities Inspection: No gross anomalies detected ROM: Adequate Sensory: Normal Motor: Unremarkable  Thoracic Spine Inspection: No gross anomalies detected Alignment: Symetrical ROM: Adequate  Lumbar Spine Inspection: No gross anomalies detected Alignment: Symetrical ROM: Adequate  Gait: WNL  Lower Extremities Inspection: No gross anomalies detected ROM: Adequate Sensory:  Normal Motor: Unremarkable  Assessment & Plan  Primary Diagnosis & Pertinent Problem List: The primary encounter diagnosis was Lumbar facet syndrome (Bilateral) (L>R). 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  Visit Diagnosis: 1. Lumbar facet syndrome (Bilateral) (L>R)   2. Chronic low back pain (Location of Primary Source of Pain) (Bilateral) (L>R)   3. Lumbar facet hypertrophy (L1-2, L2-3, and L4-5)   4. Lumbar spondylosis, unspecified spinal osteoarthritis   5. Grade 1 Retrolisthesis of L1 over L2   6. 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 Chronic pain   8. Encounter for therapeutic drug level monitoring   9. Long term current use of opiate analgesic     Problem-specific Plan(s): No problem-specific assessment & plan notes found for this encounter.   Plan of Care  Pharmacotherapy (Medications Ordered): Meds ordered this encounter  Medications  . Oxycodone HCl 10 MG TABS    Sig: Take 1-2 tablets (10-20 mg total) by mouth every 8 (eight) hours as needed.    Dispense:  180 tablet     Refill:  0    Do not place this medication, or any other prescription from our practice, on "Automatic Refill". Patient may have prescription filled one day early if pharmacy is closed on scheduled refill date. Do not fill until: 11/23/15 To last until: 12/23/15  . Oxycodone HCl 10 MG TABS    Sig: Take 1-2 tablets (10-20 mg total) by mouth every 8 (eight) hours as needed.    Dispense:  180 tablet    Refill:  0    Do not place this medication, or any other prescription from our practice, on "Automatic Refill". Patient may have prescription filled one day early if pharmacy is closed on scheduled refill date. Do not fill until: 12/23/15 To last until: 01/22/16  . Oxycodone HCl 10 MG TABS    Sig: Take 1-2 tablets (10-20 mg total) by mouth every 8 (eight) hours as needed.    Dispense:  180 tablet    Refill:  0    Do not place this medication, or any other prescription from our practice, on "Automatic Refill". Patient may have prescription filled one day early if pharmacy is closed on scheduled refill date. Do not fill until: 01/22/16 To last until: 02/21/16    [facility] & Procedure Ordered: Orders Placed This Encounter  Procedures  . LUMBAR FACET(MEDIAL BRANCH NERVE BLOCK) MBNB    Standing Status: Future     Number of Occurrences:      Standing Expiration Date: 11/10/2016    Scheduling Instructions:     Side: Bilateral     Level: L2, L3, L4, L5, & S1 Medial Branch Nerve     Sedation: With Sedation.     Timeframe: ASAA (make sure to stop the Elaquis areas for 3 days prior to procedure)    Order Specific Question:  Where will this procedure be performed?    Answer:  [facility]    Imaging Ordered: None  Interventional Therapies: Scheduled: Bilateral lumbar facet block under fluoroscopic guidance and IV sedation. PRN Procedures: Possible bilateral lumbar facet radiofrequency ablation    Referral(s) or Consult(s): Cardiology consult with Dr. Josefa Half is to determine if  the patient is able to stop her blood thinners for procedures.  Medications administered during this visit: Kristin [name] had no medications administered during this visit.  Future Appointments Date Time Provider [place]  11/20/2015 2:00 PM Arlis Porta., MD [facility] None  12/18/2015 1:00 PM Milinda Pointer, MD [facility] None  12/22/2015 11:00 AM Arlis Porta., MD [facility] None  02/11/2016 10:00 AM Milinda Pointer, MD [facility] None  03/03/2016 10:30 AM Nori Riis, PA-C [facility] None    Primary Care Physician: Dicky Doe, MD Location: [facility] Note by: Kathlen Brunswick. Dossie Arbour, M.D, DABA, DABAPM, DABPM, DABIPP, FIPP  Pain Score Disclaimer: We use the NRS-11 scale. This is a self-reported, subjective measurement of pain severity with only modest accuracy. It is used primarily to identify changes within a particular patient. It must be understood that outpatient pain scales are significantly less accurate that those used for research, where they can be applied under ideal controlled circumstances with minimal exposure to variables. In reality, the score is likely to be a combination of pain intensity and pain affect, where pain affect describes the degree of emotional arousal or changes in action readiness caused by the sensory experience of pain. Factors such as social and work situation, setting, emotional state, anxiety levels, expectation, and prior pain experience may influence pain perception and show  large inter-individual differences that may also be affected by time variables.

## 2015-11-17 ENCOUNTER — Other Ambulatory Visit: Payer: Self-pay | Admitting: Pain Medicine

## 2015-11-20 ENCOUNTER — Other Ambulatory Visit: Payer: Self-pay | Admitting: Family Medicine

## 2015-11-20 ENCOUNTER — Ambulatory Visit (INDEPENDENT_AMBULATORY_CARE_PROVIDER_SITE_OTHER): Payer: Medicare Other | Admitting: Family Medicine

## 2015-11-20 ENCOUNTER — Encounter: Payer: Self-pay | Admitting: Family Medicine

## 2015-11-20 ENCOUNTER — Ambulatory Visit: Payer: Medicare Other | Admitting: Family Medicine

## 2015-11-20 VITALS — BP 141/76 | HR 48 | Temp 98.2°F | Resp 16 | Ht 69.0 in | Wt 177.0 lb

## 2015-11-20 DIAGNOSIS — I4892 Unspecified atrial flutter: Secondary | ICD-10-CM | POA: Diagnosis not present

## 2015-11-20 DIAGNOSIS — N390 Urinary tract infection, site not specified: Secondary | ICD-10-CM | POA: Diagnosis not present

## 2015-11-20 DIAGNOSIS — N3000 Acute cystitis without hematuria: Secondary | ICD-10-CM | POA: Diagnosis not present

## 2015-11-20 NOTE — Progress Notes (Signed)
Name: Kristin [name]   MRN: [id number]    DOB: [birth year]   Date:11/20/2015       Progress Note  Subjective  Chief Complaint  Chief Complaint  Patient presents with  . Urinary Tract Infection    follow up.    HPI Here to f/u hosp. For UTI.  Treated with antibiotics.  Was hospitalized for about 3 days.  Also found to have atr. fib.  Discharged on Eliquis.  Has appt with Dr. Josefa Half tomorrow re: her heart.    No problem-specific assessment & plan notes found for this encounter.   Past Medical History  Diagnosis Date  . Hypertension   . Hyperlipidemia   . COPD (chronic obstructive pulmonary disease) (HCC)     wears O2 at 2L via [place] at night  . Arthritis   . Chronic back pain   . CHF (congestive heart failure) ([place])   . Hepatitis C   . Gout   . Arthritis   . CKD (chronic kidney disease)   . OAB (overactive bladder)   . UTI (lower urinary tract infection)   . Cystocele   . Vaginal atrophy   . Bradycardia   . Hematuria   . Urinary frequency   . Hypomagnesemia 06/25/2015  . Cancer [facility]) 2004    rt breast cancer-post lumpectomy- chemo/rad    Past Surgical History  Procedure Laterality Date  . Abdominal hysterectomy    . Joint replacement      bilateral TKA  . Back surgery    . Rotator cuff repair      bilateral  . Breast surgery      rt lumpectomy  . Cataract extraction w/phaco  10/19/2011    Procedure: CATARACT EXTRACTION PHACO AND INTRAOCULAR LENS PLACEMENT (IOC);  Surgeon: Elta Guadeloupe T. Gershon Crane, MD;  Location: [facility];  Service: Ophthalmology;  Laterality: Right;  CDE:10.81  . Cataract extraction w/phaco  11/02/2011    Procedure: CATARACT EXTRACTION PHACO AND INTRAOCULAR LENS PLACEMENT (IOC);  Surgeon: Elta Guadeloupe T. Gershon Crane, MD;  Location: [facility];  Service: Ophthalmology;  Laterality: Left;  CDE 8.60  . Carpal tunnel release      Family History  Problem Relation Age of Onset  . Anesthesia problems Neg Hx   . Hypotension Neg Hx   . Malignant hyperthermia Neg Hx   . Pseudochol  deficiency Neg Hx   . Heart disease Father   . Heart disease Mother   . Stroke Mother   . Cancer Sister   . Breast cancer Sister 11  . Breast cancer Paternal Aunt     Social History   Social History  . Marital Status: Single    Spouse Name: N/A  . Number of Children: N/A  . Years of Education: N/A   Occupational History  . retired    Social History Main Topics  . Smoking status: Former Smoker -- 1.25 packs/day for 40 years    Types: Cigarettes  . Smokeless tobacco: Not on file  . Alcohol Use: No  . Drug Use: No  . Sexual Activity: Yes    Birth Control/ Protection: Surgical   Other Topics Concern  . Not on file   Social History Narrative     Current outpatient prescriptions:  .  albuterol (PROVENTIL) (2.5 MG/3ML) 0.083% nebulizer solution, Take 3 mLs (2.5 mg total) by nebulization every 8 (eight) hours as needed. Shortness of breath and wheezing, Disp: 75 mL, Rfl: 12 .  apixaban (ELIQUIS) 5 MG TABS tablet, Take 1 tablet (5 mg  total) by mouth 2 (two) times daily., Disp: 60 tablet, Rfl: 0 .  bisacodyl (DULCOLAX) 5 MG EC tablet, Take 2 tablets (10 mg total) by mouth daily as needed for moderate constipation., Disp: 60 tablet, Rfl: PRN .  calcium citrate-vitamin D (CITRACAL+D) 315-200 MG-UNIT per tablet, Take 1 tablet by mouth 2 (two) times daily., Disp: , Rfl:  .  Cholecalciferol (VITAMIN D3) 2000 units TABS, Take 1 tablet by mouth daily., Disp: 30 tablet, Rfl: 2 .  furosemide (LASIX) 20 MG tablet, Take 1 tablet (20 mg total) by mouth daily., Disp: 30 tablet, Rfl: 12 .  levocetirizine (XYZAL) 5 MG tablet, Take 1 tablet (5 mg total) by mouth every evening., Disp: 30 tablet, Rfl: 12 .  lisinopril (PRINIVIL,ZESTRIL) 10 MG tablet, Take 10 mg by mouth daily., Disp: , Rfl:  .  magnesium oxide (MAG-OX) 400 MG tablet, Take 1 tablet (400 mg total) by mouth daily., Disp: 30 tablet, Rfl: 2 .  omeprazole (PRILOSEC) 20 MG capsule, Take 20 mg by mouth daily., Disp: , Rfl:  .  Oxycodone  HCl 10 MG TABS, Take 1-2 tablets (10-20 mg total) by mouth every 8 (eight) hours as needed., Disp: 180 tablet, Rfl: 0 .  Oxycodone HCl 10 MG TABS, Take 1-2 tablets (10-20 mg total) by mouth every 8 (eight) hours as needed., Disp: 180 tablet, Rfl: 0 .  rOPINIRole (REQUIP) 2 MG tablet, Take 1 tablet (2 mg total) by mouth at bedtime., Disp: 30 tablet, Rfl: 2 .  Wheat Dextrin (BENEFIBER) POWD, Stir 2 teaspoons of Benefiber into 4-8 oz of any non-carbonated beverage or soft food (hot or cold) PO TID, Disp: 500 g, Rfl: PRN  Allergies  Allergen Reactions  . Cyclobenzaprine Rash    Severe Rash  . Flexeril [Cyclobenzaprine Hcl]     Severe Rash  . Vancomycin Rash    [place] Syndrome     Review of Systems  Constitutional: Negative for fever, chills, weight loss and malaise/fatigue.  HENT: Negative for hearing loss.   Eyes: Negative for blurred vision and double vision.  Respiratory: Negative for cough, shortness of breath and wheezing.   Cardiovascular: Negative for chest pain, palpitations and orthopnea.  Gastrointestinal: Negative for heartburn, abdominal pain and blood in stool.  Genitourinary: Positive for frequency. Negative for dysuria and urgency.  Musculoskeletal: Negative for myalgias and joint pain.  Skin: Negative for rash.  Neurological: Negative for tremors, weakness and headaches.      Objective  Filed Vitals:   11/20/15 1402  BP: 141/76  Pulse: 48  Temp: 98.2 F (36.8 C)  TempSrc: Oral  Resp: 16  Height: '5\' 9"'  (1.753 m)  Weight: 177 lb (80.287 kg)    Physical Exam  Constitutional: She is oriented to person, place, and time and well-developed, well-nourished, and in no distress. No distress.  HENT:  Head: Normocephalic and atraumatic.  Neck: Normal range of motion. Neck supple. No thyromegaly present.  Cardiovascular: Normal rate and normal heart sounds.  An irregularly irregular rhythm present. Exam reveals no gallop and no friction rub.   No murmur  heard. Pulmonary/Chest: Effort normal and breath sounds normal. No respiratory distress. She has no wheezes. She has no rales.  Abdominal: Bowel sounds are normal. She exhibits no distension and no mass. There is no tenderness.  No CVA tenderness.  Musculoskeletal: She exhibits no edema.  Lymphadenopathy:    She has no cervical adenopathy.  Neurological: She is alert and oriented to person, place, and time.  Vitals reviewed.  Recent Results (from the past 2160 hour(s))  Urinalysis, Complete     Status: Abnormal   Collection Time: 09/03/15 10:36 AM  Result Value Ref Range   Specific Gravity, UA 1.015 1.005 - 1.030   pH, UA 5.5 5.0 - 7.5   Color, UA Yellow Yellow   Appearance Ur Clear Clear   Leukocytes, UA Trace (A) Negative   Protein, UA 1+ (A) Negative/Trace   Glucose, UA Negative Negative   Ketones, UA Trace (A) Negative   RBC, UA Trace (A) Negative   Bilirubin, UA Negative Negative   Urobilinogen, Ur 0.2 0.2 - 1.0 mg/dL   Nitrite, UA Negative Negative   Microscopic Examination See below:   Microscopic Examination     Status: Abnormal   Collection Time: 09/03/15 10:36 AM  Result Value Ref Range   WBC, UA 6-10 (A) 0 -  5 /hpf   RBC, UA None seen 0 -  2 /hpf   Epithelial Cells (non renal) 0-10 0 - 10 /hpf   Mucus, UA Present (A) Not Estab.   Bacteria, UA Few (A) None seen/Few  BLADDER SCAN AMB NON-IMAGING     Status: None   Collection Time: 09/03/15 10:54 AM  Result Value Ref Range   Scan Result 14 mL   ToxASSURE Select 13 (MW), Urine     Status: None   Collection Time: 09/24/15  2:05 PM  Result Value Ref Range   Report Summary FINAL     Comment: ==================================================================== TOXASSURE SELECT 13 (MW) ==================================================================== Test                             Result       Flag       Units Drug Present and Declared for Prescription Verification   Oxycodone                      2951          EXPECTED   ng/mg creat   Oxymorphone                    756          EXPECTED   ng/mg creat   Noroxycodone                   >5348        EXPECTED   ng/mg creat   Noroxymorphone                 224          EXPECTED   ng/mg creat    Sources of oxycodone are scheduled prescription medications.    Oxymorphone, noroxycodone, and noroxymorphone are expected    metabolites of oxycodone. Oxymorphone is also available as a    scheduled prescription medication. ==================================================================== Test                      Result    Flag   Units      Ref Range   Creatinine              187              mg/dL      >=20 ====== ============================================================== Declared Medications:  The flagging and interpretation on this report are based on the  following declared medications.  Unexpected results may arise from  inaccuracies in the declared medications.  **  Note: The testing scope of this panel includes these medications:  Oxycodone (Percocet)  **Note: The testing scope of this panel does not include following  reported medications:  Acetaminophen (Percocet) ==================================================================== For clinical consultation, please call [phone]. ====================================================================    PDF .   Comprehensive metabolic panel     Status: Abnormal   Collection Time: 09/24/15  3:20 PM  Result Value Ref Range   Sodium 140 135 - 145 mmol/L   Potassium 4.3 3.5 - 5.1 mmol/L   Chloride 107 101 - 111 mmol/L   CO2 28 22 - 32 mmol/L   Glucose, Bld 87 65 - 99 mg/dL   BUN 22 (H) 6 - 20 mg/dL   Creatinine, Ser 1.22 (H) 0.44 - 1.00 mg/dL   Calcium 10.0 8.9 - 10.3 mg/dL   Total Protein 7.4 6.5 - 8.1 g/dL   Albumin 4.0 3.5 - 5.0 g/dL   AST 24 15 - 41 U/L   ALT 15 14 - 54 U/L   Alkaline Phosphatase 82 38 - 126 U/L   Total Bilirubin 0.7 0.3 - 1.2 mg/dL   GFR calc non Af  Amer 40 (L) >60 mL/min   GFR calc Af Amer 46 (L) >60 mL/min    Comment: (NOTE) The eGFR has been calculated using the CKD EPI equation. This calculation has not been validated in all clinical situations. eGFR's persistently <60 mL/min signify possible Chronic Kidney Disease.    Anion gap 5 5 - 15  C-reactive protein     Status: None   Collection Time: 09/24/15  3:20 PM  Result Value Ref Range   CRP <0.5 <1.0 mg/dL    Comment: Performed at [facility]  Magnesium     Status: None   Collection Time: 09/24/15  3:20 PM  Result Value Ref Range   Magnesium 2.2 1.7 - 2.4 mg/dL  Sedimentation rate     Status: None   Collection Time: 09/24/15  3:20 PM  Result Value Ref Range   Sed Rate 12 0 - 30 mm/hr  Basic metabolic panel     Status: Abnormal   Collection Time: 10/28/15 10:59 PM  Result Value Ref Range   Sodium 142 135 - 145 mmol/L   Potassium 4.4 3.5 - 5.1 mmol/L   Chloride 109 101 - 111 mmol/L   CO2 26 22 - 32 mmol/L   Glucose, Bld 113 (H) 65 - 99 mg/dL   BUN 27 (H) 6 - 20 mg/dL   Creatinine, Ser 1.51 (H) 0.44 - 1.00 mg/dL   Calcium 10.0 8.9 - 10.3 mg/dL   GFR calc non Af Amer 31 (L) >60 mL/min   GFR calc Af Amer 35 (L) >60 mL/min    Comment: (NOTE) The eGFR has been calculated using the CKD EPI equation. This calculation has not been validated in all clinical situations. eGFR's persistently <60 mL/min signify possible Chronic Kidney Disease.    Anion gap 7 5 - 15  CBC     Status: Abnormal   Collection Time: 10/28/15 10:59 PM  Result Value Ref Range   WBC 7.2 3.6 - 11.0 K/uL   RBC 4.67 3.80 - 5.20 MIL/uL   Hemoglobin 14.2 12.0 - 16.0 g/dL   HCT 42.5 35.0 - 47.0 %   MCV 91.0 80.0 - 100.0 fL   MCH 30.4 26.0 - 34.0 pg   MCHC 33.4 32.0 - 36.0 g/dL   RDW 15.2 (H) 11.5 - 14.5 %   Platelets 175 150 - 440 K/uL  Troponin  I     Status: None   Collection Time: 10/28/15 10:59 PM  Result Value Ref Range   Troponin I 0.03 <0.031 ng/mL    Comment:        NO INDICATION  OF MYOCARDIAL INJURY.   Urinalysis complete, with microscopic ([facility] only)     Status: Abnormal   Collection Time: 10/28/15 11:29 PM  Result Value Ref Range   Color, Urine YELLOW (A) YELLOW   APPearance CLEAR (A) CLEAR   Glucose, UA NEGATIVE NEGATIVE mg/dL   Bilirubin Urine NEGATIVE NEGATIVE   Ketones, ur NEGATIVE NEGATIVE mg/dL   Specific Gravity, Urine 1.013 1.005 - 1.030   Hgb urine dipstick NEGATIVE NEGATIVE   pH 5.0 5.0 - 8.0   Protein, ur NEGATIVE NEGATIVE mg/dL   Nitrite NEGATIVE NEGATIVE   Leukocytes, UA 3+ (A) NEGATIVE   RBC / HPF 0-5 0 - 5 RBC/hpf   WBC, UA 6-30 0 - 5 WBC/hpf   Bacteria, UA RARE (A) NONE SEEN   Squamous Epithelial / LPF 0-5 (A) NONE SEEN   Mucous PRESENT    Hyaline Casts, UA PRESENT   Troponin I     Status: None   Collection Time: 10/29/15  3:57 AM  Result Value Ref Range   Troponin I <0.03 <0.031 ng/mL    Comment:        NO INDICATION OF MYOCARDIAL INJURY.   Basic metabolic panel     Status: Abnormal   Collection Time: 10/29/15  3:57 AM  Result Value Ref Range   Sodium 141 135 - 145 mmol/L   Potassium 5.0 3.5 - 5.1 mmol/L    Comment: HEMOLYSIS AT THIS LEVEL MAY AFFECT RESULT   Chloride 111 101 - 111 mmol/L   CO2 21 (L) 22 - 32 mmol/L   Glucose, Bld 88 65 - 99 mg/dL   BUN 27 (H) 6 - 20 mg/dL   Creatinine, Ser 1.25 (H) 0.44 - 1.00 mg/dL   Calcium 9.3 8.9 - 10.3 mg/dL   GFR calc non Af Amer 38 (L) >60 mL/min   GFR calc Af Amer 44 (L) >60 mL/min    Comment: (NOTE) The eGFR has been calculated using the CKD EPI equation. This calculation has not been validated in all clinical situations. eGFR's persistently <60 mL/min signify possible Chronic Kidney Disease.    Anion gap 9 5 - 15  Glucose, capillary     Status: None   Collection Time: 10/29/15  7:30 AM  Result Value Ref Range   Glucose-Capillary 81 65 - 99 mg/dL  Troponin I     Status: None   Collection Time: 10/29/15  8:39 AM  Result Value Ref Range   Troponin I <0.03 <0.031 ng/mL     Comment:        NO INDICATION OF MYOCARDIAL INJURY.   TSH     Status: None   Collection Time: 10/29/15  8:39 AM  Result Value Ref Range   TSH 2.033 0.350 - 4.500 uIU/mL  Glucose, capillary     Status: None   Collection Time: 10/29/15 11:19 AM  Result Value Ref Range   Glucose-Capillary 88 65 - 99 mg/dL  Troponin I     Status: None   Collection Time: 10/29/15  2:35 PM  Result Value Ref Range   Troponin I <0.03 <0.031 ng/mL    Comment:        NO INDICATION OF MYOCARDIAL INJURY.      Assessment & Plan  Problem List Items Addressed This  Visit      Cardiovascular and Mediastinum   Atrial flutter ([place])     Genitourinary   UTI (lower urinary tract infection)   Relevant Orders   Urinalysis   Urine Culture    Other Visit Diagnoses    Acute cystitis without hematuria    -  Primary    Relevant Orders    POCT urinalysis dipstick       No orders of the defined types were placed in this encounter.   1. Acute cystitis without hematuria  - POCT urinalysis dipstick-wnl  2. Atrial flutter, unspecified type ([place]) Keep appt. With Dr. Josefa Half tomorrow.   3. UTI (lower urinary tract infection)   - Urine Culture

## 2015-11-21 DIAGNOSIS — I5032 Chronic diastolic (congestive) heart failure: Secondary | ICD-10-CM | POA: Diagnosis not present

## 2015-11-21 DIAGNOSIS — J410 Simple chronic bronchitis: Secondary | ICD-10-CM | POA: Diagnosis not present

## 2015-11-21 DIAGNOSIS — I495 Sick sinus syndrome: Secondary | ICD-10-CM | POA: Diagnosis not present

## 2015-11-21 DIAGNOSIS — I4892 Unspecified atrial flutter: Secondary | ICD-10-CM | POA: Insufficient documentation

## 2015-11-21 DIAGNOSIS — I10 Essential (primary) hypertension: Secondary | ICD-10-CM | POA: Diagnosis not present

## 2015-11-21 DIAGNOSIS — N183 Chronic kidney disease, stage 3 (moderate): Secondary | ICD-10-CM | POA: Diagnosis not present

## 2015-11-25 ENCOUNTER — Telehealth: Payer: Self-pay

## 2015-11-25 ENCOUNTER — Other Ambulatory Visit: Payer: Self-pay | Admitting: Family Medicine

## 2015-11-25 DIAGNOSIS — N390 Urinary tract infection, site not specified: Secondary | ICD-10-CM

## 2015-11-25 LAB — URINE CULTURE

## 2015-11-25 MED ORDER — AMOXICILLIN-POT CLAVULANATE 875-125 MG PO TABS: 1.0000 | ORAL_TABLET | Freq: Two times a day (BID) | ORAL | Status: DC

## 2015-11-25 NOTE — Telephone Encounter (Signed)
Patient notified that whe could stop Eliquis for 3 days per Dr Saralyn Pilar.  Pre procedure instructions given and patient states understanding.

## 2015-11-26 ENCOUNTER — Telehealth: Payer: Self-pay

## 2015-11-26 NOTE — Telephone Encounter (Signed)
Spoke with patient.  She was given a script for a UTI and started today.  Coming in on the 11th for procedure.  Will have taken antibiotic for 6 days.  Instructed to call if running fever or any other problems.

## 2015-11-26 NOTE — Telephone Encounter (Signed)
Left message to call office

## 2015-11-26 NOTE — Telephone Encounter (Signed)
Having a procedure on April 11,  Her PCP gave her amoxicillan 10 days for infection. She wants to know if that will interfere with her procedure.

## 2015-11-26 NOTE — Telephone Encounter (Signed)
Note: It is the responsibility of all patients to have an up to date list of all the medications that need to be refilled at the time of their appointment.  Pain Management Policy: Medication changes and refills will be conducted during appointments only, after determining if the medical indications for the use of the medication are still present. No fax, phone, or electronic refills will be conducted outside of an evaluation appointment. Patients are encouraged to call and set up an appointment to comply with stipulated policy. 

## 2015-11-27 DIAGNOSIS — I483 Typical atrial flutter: Secondary | ICD-10-CM | POA: Diagnosis not present

## 2015-12-02 ENCOUNTER — Ambulatory Visit: Payer: Medicare Other | Attending: Pain Medicine | Admitting: Pain Medicine

## 2015-12-02 ENCOUNTER — Encounter: Payer: Self-pay | Admitting: Pain Medicine

## 2015-12-02 ENCOUNTER — Other Ambulatory Visit: Payer: Self-pay | Admitting: Family Medicine

## 2015-12-02 VITALS — BP 169/70 | HR 61 | Temp 98.3°F | Resp 16 | Ht 69.0 in | Wt 175.0 lb

## 2015-12-02 DIAGNOSIS — M75102 Unspecified rotator cuff tear or rupture of left shoulder, not specified as traumatic: Secondary | ICD-10-CM | POA: Insufficient documentation

## 2015-12-02 DIAGNOSIS — Z79891 Long term (current) use of opiate analgesic: Secondary | ICD-10-CM | POA: Diagnosis not present

## 2015-12-02 DIAGNOSIS — G8929 Other chronic pain: Secondary | ICD-10-CM | POA: Diagnosis not present

## 2015-12-02 DIAGNOSIS — I5032 Chronic diastolic (congestive) heart failure: Secondary | ICD-10-CM | POA: Insufficient documentation

## 2015-12-02 DIAGNOSIS — R7303 Prediabetes: Secondary | ICD-10-CM | POA: Diagnosis not present

## 2015-12-02 DIAGNOSIS — N390 Urinary tract infection, site not specified: Secondary | ICD-10-CM | POA: Insufficient documentation

## 2015-12-02 DIAGNOSIS — R7309 Other abnormal glucose: Secondary | ICD-10-CM | POA: Diagnosis not present

## 2015-12-02 DIAGNOSIS — M25519 Pain in unspecified shoulder: Secondary | ICD-10-CM | POA: Diagnosis present

## 2015-12-02 DIAGNOSIS — I129 Hypertensive chronic kidney disease with stage 1 through stage 4 chronic kidney disease, or unspecified chronic kidney disease: Secondary | ICD-10-CM | POA: Insufficient documentation

## 2015-12-02 DIAGNOSIS — M4316 Spondylolisthesis, lumbar region: Secondary | ICD-10-CM | POA: Diagnosis not present

## 2015-12-02 DIAGNOSIS — I499 Cardiac arrhythmia, unspecified: Secondary | ICD-10-CM | POA: Insufficient documentation

## 2015-12-02 DIAGNOSIS — E559 Vitamin D deficiency, unspecified: Secondary | ICD-10-CM | POA: Diagnosis not present

## 2015-12-02 DIAGNOSIS — M25551 Pain in right hip: Secondary | ICD-10-CM | POA: Diagnosis not present

## 2015-12-02 DIAGNOSIS — J449 Chronic obstructive pulmonary disease, unspecified: Secondary | ICD-10-CM | POA: Insufficient documentation

## 2015-12-02 DIAGNOSIS — I341 Nonrheumatic mitral (valve) prolapse: Secondary | ICD-10-CM | POA: Insufficient documentation

## 2015-12-02 DIAGNOSIS — G2581 Restless legs syndrome: Secondary | ICD-10-CM | POA: Insufficient documentation

## 2015-12-02 DIAGNOSIS — M47816 Spondylosis without myelopathy or radiculopathy, lumbar region: Secondary | ICD-10-CM

## 2015-12-02 DIAGNOSIS — M545 Low back pain: Secondary | ICD-10-CM | POA: Diagnosis not present

## 2015-12-02 DIAGNOSIS — C50911 Malignant neoplasm of unspecified site of right female breast: Secondary | ICD-10-CM | POA: Diagnosis not present

## 2015-12-02 DIAGNOSIS — M4806 Spinal stenosis, lumbar region: Secondary | ICD-10-CM | POA: Insufficient documentation

## 2015-12-02 DIAGNOSIS — R55 Syncope and collapse: Secondary | ICD-10-CM | POA: Diagnosis not present

## 2015-12-02 DIAGNOSIS — M5137 Other intervertebral disc degeneration, lumbosacral region: Secondary | ICD-10-CM | POA: Diagnosis not present

## 2015-12-02 DIAGNOSIS — J069 Acute upper respiratory infection, unspecified: Secondary | ICD-10-CM | POA: Insufficient documentation

## 2015-12-02 DIAGNOSIS — I4892 Unspecified atrial flutter: Secondary | ICD-10-CM | POA: Diagnosis not present

## 2015-12-02 DIAGNOSIS — Z9889 Other specified postprocedural states: Secondary | ICD-10-CM | POA: Insufficient documentation

## 2015-12-02 DIAGNOSIS — M47896 Other spondylosis, lumbar region: Secondary | ICD-10-CM

## 2015-12-02 DIAGNOSIS — K5909 Other constipation: Secondary | ICD-10-CM | POA: Diagnosis not present

## 2015-12-02 DIAGNOSIS — M549 Dorsalgia, unspecified: Secondary | ICD-10-CM | POA: Diagnosis present

## 2015-12-02 DIAGNOSIS — M25552 Pain in left hip: Secondary | ICD-10-CM | POA: Diagnosis not present

## 2015-12-02 MED ORDER — TRIAMCINOLONE ACETONIDE 40 MG/ML IJ SUSP
INTRAMUSCULAR | Status: AC
  Administered 2015-12-02: 09:00:00
  Filled 2015-12-02: qty 2

## 2015-12-02 MED ORDER — MIDAZOLAM HCL 5 MG/5ML IJ SOLN
INTRAMUSCULAR | Status: AC
  Administered 2015-12-02: 1 mg via INTRAVENOUS
  Filled 2015-12-02: qty 5

## 2015-12-02 MED ORDER — MIDAZOLAM HCL 5 MG/5ML IJ SOLN: 5.0000 mg | INTRAMUSCULAR | Status: DC

## 2015-12-02 MED ORDER — LIDOCAINE HCL (PF) 1 % IJ SOLN: 10.0000 mL | Freq: Once | INTRAMUSCULAR | Status: DC

## 2015-12-02 MED ORDER — FENTANYL CITRATE (PF) 100 MCG/2ML IJ SOLN: 100.0000 ug | INTRAMUSCULAR | Status: DC

## 2015-12-02 MED ORDER — ROPIVACAINE HCL 2 MG/ML IJ SOLN
INTRAMUSCULAR | Status: AC
  Administered 2015-12-02: 09:00:00
  Filled 2015-12-02: qty 20

## 2015-12-02 MED ORDER — ROPIVACAINE HCL 2 MG/ML IJ SOLN: 9.0000 mL | Freq: Once | INTRAMUSCULAR | Status: DC

## 2015-12-02 MED ORDER — LACTATED RINGERS IV SOLN: 1000.0000 mL | INTRAVENOUS | Status: AC

## 2015-12-02 MED ORDER — TRIAMCINOLONE ACETONIDE 40 MG/ML IJ SUSP: 40.0000 mg | Freq: Once | INTRAMUSCULAR | Status: DC

## 2015-12-02 MED ORDER — FENTANYL CITRATE (PF) 100 MCG/2ML IJ SOLN
INTRAMUSCULAR | Status: AC
  Administered 2015-12-02: 50 ug via INTRAVENOUS
  Filled 2015-12-02: qty 2

## 2015-12-02 NOTE — Patient Instructions (Signed)

## 2015-12-02 NOTE — Progress Notes (Signed)
Patient's Name: Kristin [name] Patient type: Established  MRN: [id number] Service setting: Ambulatory outpatient  DOB: May 26, 1930   DOS: 12/02/2015    Primary Reason(s) for Visit: Interventional Pain Management Treatment. CC: Back Pain and Shoulder Pain   Procedure:  Anesthesia, Analgesia, Anxiolysis:  Type: Diagnostic Medial Branch Facet Block Region: Lumbar Level: L2, L3, L4, L5, & S1 Medial Branch Level(s) Laterality: Bilateral  Indications: 1. Lumbar facet syndrome (Bilateral) (L>R)   2. Lumbar facet hypertrophy (L1-2, L2-3, and L4-5)   3. Lumbar spondylosis, unspecified spinal osteoarthritis   4. Chronic low back pain (Location of Primary Source of Pain) (Bilateral) (L>R)     Pre-procedure Pain Score: 7/10 Reported level of pain is compatible with clinical observations Post-procedure Pain Score: 0-No pain  Type: Moderate (Conscious) Sedation & Local Anesthesia Local Anesthetic: Lidocaine 1% Route: Intravenous (IV) IV Access: Secured Sedation: Meaningful verbal contact was maintained at all times during the procedure  Indication(s): Analgesia & Anxiolysis   Pre-Procedure Assessment:  Kristin [name] is a 82 y.o. year old, female patient, seen today for interventional treatment. She has Abnormal glucose tolerance test; Chronic diastolic heart failure ([place]); Chronic kidney disease (CKD), stage III (moderate); CAFL (chronic airflow limitation) ([place]); Complete rotator cuff rupture of shoulder (Left); CCF (congestive cardiac failure) (HCC); DDD (degenerative disc disease), lumbosacral; Gout; Essential (primary) hypertension; Benign essential HTN; BP (high blood pressure); H/O cardiac catheterization; Arrhythmia, sinus node ([place]); Upper respiratory infection, viral; Lumbar canal stenosis; Urinary frequency; Chronic obstructive pulmonary disease ([place]); Congestive heart failure ([place]); Degeneration of intervertebral disc of lumbosacral region; 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[place]); History of cardiac  catheterization; Sinoatrial node dysfunction ([place]); Need for influenza vaccination; Frequency of urination; Seasonal allergies; Breast cancer, right breast ([place]); Chronic pain; Chronic pain syndrome; Chronic low back pain (Location of Primary Source of Pain) (Bilateral) (L>R); Lumbar facet syndrome (Bilateral) (L>R); Lumbar spondylosis; Chronic lumbar radicular pain (Bilateral) (L>R) (L5 Dermatome); Restless leg syndrome; Myofascial pain; Neuropathic pain; Neurogenic pain; Long term current use of opiate analgesic; Long term prescription opiate use; Opiate use (90 MME/Day); Opiate dependence ([place]); Encounter for therapeutic drug level monitoring; Chronic constipation; Therapeutic opioid-induced constipation (OIC); Vitamin D insufficiency; Grade 1 Retrolisthesis of L1 over L2; Lumbar facet hypertrophy (L1-2, L2-3, and L4-5); Stenosis of lateral recess of lumbar spine (L1-2 and L4-5); 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 Her primarily concern today is the Back Pain and Shoulder Pain   Pain Type: Chronic pain Pain Location: Back Pain Orientation: Lower Pain Descriptors / Indicators: Constant, Aching Pain Frequency: Constant  Date of Last Visit: 11/11/15 Service Provided on Last Visit: Med Refill  Verification of the correct person, correct site (including marking of site), and correct procedure were performed and confirmed by the patient.  Today's Vitals   12/02/15 1003 12/02/15 1008 12/02/15 1010 12/02/15 1022  BP:  127/54 109/66 119/70 121/75  Pulse: 57 61 69 69  Temp:      TempSrc:      Resp: _0 Height:      Weight:      SpO2: 97% 96% 96%   PainSc:   0-No pain   Calculated BMI: Body mass  index is 25.83 kg/(m^2). Allergies: She is allergic to cyclobenzaprine; flexeril; and vancomycin.. Primary Diagnosis: Facet syndrome, lumbar [M54.5]  Consent: Secured. Under the influence of no sedatives a written informed consent was obtained, after having provided information on the risks and possible complications. To fulfill our ethical and legal obligations, as recommended by the American Medical Association's Code of Ethics, we have provided information to the patient about our clinical impression; the nature and purpose of the treatment or procedure; the risks, benefits, and possible complications of the intervention; alternatives; the risk(s) and benefit(s) of the alternative treatment(s) or procedure(s); and the risk(s) and benefit(s) of doing nothing. The patient was provided information about the risks and possible complications associated with the procedure. In the case of spinal procedures these may include, but are not limited to, failure to achieve desired goals, infection, bleeding, organ or nerve damage, allergic reactions, paralysis, and death. In addition, the patient was informed that Medicine is not an exact science; therefore, there is also the possibility of unforeseen risks and possible complications that may result in a catastrophic outcome. The patient indicated having understood very clearly. We have given the patient no guarantees and we have made no promises. Enough time was given to the patient to ask questions, all of which were answered to the patient's satisfaction.  Pre-Procedure Preparation: Safety Precautions: Allergies reviewed. Appropriate site, procedure, and patient were confirmed by following the Joint Commission's Universal Protocol (UP.01.01.01), in the form of a "Time  Out". The patient was asked to confirm marked site and procedure, before commencing. The patient was asked about blood thinners, or active infections, both of which were denied. Patient was assessed for positional comfort and all pressure points were checked before starting procedure. Monitoring:  As per clinic protocol. Infection Control Precautions: Sterile technique used. Standard Universal Precautions were taken as recommended by the Department of [facility] for Disease Control and Prevention (CDC). Standard pre-surgical skin prep was conducted. Respiratory hygiene and cough etiquette was practiced. Hand hygiene observed. Safe injection practices and needle disposal techniques followed. SDV (single dose vial) medications used. Medications properly checked for expiration dates and contaminants. Personal protective equipment (PPE) used: Sterile double glove technique. Radiation resistant gloves. Sterile surgical gloves.  Description of Procedure Process:   Time-out: "Time-out" completed before starting procedure, as per protocol. Position: Prone Target Area: For Lumbar Facet blocks, the target is the groove formed by the junction of the transverse process and superior articular process. For the L5 dorsal ramus, the target is the notch between superior articular process and sacral ala. For the S1 dorsal ramus, the target is the superior and lateral edge of the posterior S1 Sacral foramen. Approach: Paramedial approach. Area Prepped: Entire Posterior Lumbosacral Region Prepping solution: ChloraPrep (2% chlorhexidine gluconate and 70% isopropyl alcohol) Safety Precautions: Aspiration looking for blood return was conducted prior to all injections. At no point did we inject any substances, as a needle was being advanced. No attempts were made at seeking any paresthesias. Safe injection practices and needle disposal techniques used. Medications properly checked for expiration dates. SDV (single  dose vial) medications used.   Description of the Procedure: Protocol guidelines were followed. The patient was placed in position over the fluoroscopy table. The target area was identified and the area prepped in the usual manner. Skin desensitized using vapocoolant spray. Skin & deeper tissues infiltrated with local anesthetic. Appropriate amount of time allowed to pass for local anesthetics to take effect. The procedure needle was introduced through the  skin, ipsilateral to the reported pain, and advanced to the target area. Employing the "Medial Branch Technique", the needles were advanced to the angle made by the superior and medial portion of the transverse process, and the lateral and inferior portion of the superior articulating process of the targeted vertebral bodies. This area is known as "Burton's Eye" or the "Eye of the [place] Dog". A procedure needle was introduced through the skin, and this time advanced to the angle made by the superior and medial border of the sacral ala, and the lateral border of the S1 vertebral body. This last needle was later repositioned at the superior and lateral border of the posterior S1 foramen. Negative aspiration confirmed. Solution injected in intermittent fashion, asking for systemic symptoms every 0.5cc of injectate. The needles were then removed and the area cleansed, making sure to leave some of the prepping solution back to take advantage of its long term bactericidal properties. EBL: None Materials & Medications Used:  Needle(s) Used: 22g - 3.5" Spinal Needle(s) Medications Administered today: We administered ropivacaine (PF) 2 mg/ml (0.2%), fentaNYL, triamcinolone acetonide, and midazolam.Please see chart orders for dosing details.  Imaging Guidance:   Type of Imaging Technique: Fluoroscopy Guidance (Spinal) Indication(s): Assistance in needle guidance and placement for procedures requiring needle placement in or near specific anatomical locations not  easily accessible without such assistance. Exposure Time: Please see nurses notes. Contrast: None required. Fluoroscopic Guidance: I was personally present in the fluoroscopy suite, where the patient was placed in position for the procedure, over the fluoroscopy-compatible table. Fluoroscopy was manipulated, using "Tunnel Vision Technique", to obtain the best possible view of the target area, on the affected side. Parallax error was corrected before commencing the procedure. A "direction-depth-direction" technique was used to introduce the needle under continuous pulsed fluoroscopic guidance. Once the target was reached, antero-posterior, oblique, and lateral fluoroscopic projection views were taken to confirm needle placement in all planes. Permanently recorded images stored by scanning into EMR. Interpretation: Intraoperative imaging interpretation by performing Physician. Adequate needle placement confirmed. Adequate needle placement confirmed in AP, lateral, & Oblique Views. No contrast injected.  Antibiotic Prophylaxis:  Indication(s): No indications identified. Type:  Antibiotics Given (last 72 hours)    None       Post-operative Assessment:   Complications: No immediate post-treatment complications were observed. Disposition: Return to clinic for follow-up evaluation. The patient tolerated the entire procedure well. A repeat set of vitals were taken after the procedure and the patient was kept under observation following institutional policy, for this procedure. Post-procedural neurological assessment was performed, showing return to baseline, prior to discharge. The patient was discharged home, once institutional criteria were met. The patient was provided with post-procedure discharge instructions, including a section on how to identify potential problems. Should any problems arise concerning this procedure, the patient was given instructions to immediately contact us, at any time, without  hesitation. In any case, we plan to contact the patient by telephone for a follow-up status report regarding this interventional procedure. Comments:  No additional relevant information.  Medications administered during this visit: We administered ropivacaine (PF) 2 mg/ml (0.2%), fentaNYL, triamcinolone acetonide, and midazolam.  Prescriptions ordered during this visit: New Prescriptions   No medications on file    Future Appointments Date Time Provider [place]  12/18/2015 1:00 PM Milinda Pointer, MD [facility] None  02/11/2016 10:00 AM Milinda Pointer, MD [facility] None  02/19/2016 11:00 AM Arlis Porta., MD [facility] None  02/25/2016 10:00 AM Nori Riis,  PA-C [facility] None    Primary Care Physician: Dicky Doe, MD Location: [facility] Note by: Kathlen Brunswick. Dossie Arbour, M.D, DABA, DABAPM, DABPM, DABIPP, FIPP   Illustration of the posterior view of the lumbar spine and the posterior neural structures. Laminae of L2 through S1 are labeled. DPRL5, dorsal primary ramus of L5; DPRS1, dorsal primary ramus of S1; DPR3, dorsal primary ramus of L3; FJ, facet (zygapophyseal) joint L3-L4; I, inferior articular process of L4; LB1, lateral branch of dorsal primary ramus of L1; IAB, inferior articular branches from L3 medial branch (supplies L4-L5 facet joint); IBP, intermediate branch plexus; MB3, medial branch of dorsal primary ramus of L3; NR3, third lumbar nerve root; S, superior articular process of L5; SAB, superior articular branches from L4 (supplies L4-5 facet joint also); TP3, transverse process of L3.  Disclaimer:  Medicine is not an Chief Strategy Officer. The only guarantee in medicine is that nothing is guaranteed. It is important to note that the decision to proceed with this intervention was based on the information collected from the patient. The Data and conclusions were drawn from the patient's questionnaire, the interview, and the physical  examination. Because the information was provided in large part by the patient, it cannot be guaranteed that it has not been purposely or unconsciously manipulated. Every effort has been made to obtain as much relevant data as possible for this evaluation. It is important to note that the conclusions that lead to this procedure are derived in large part from the available data. Always take into account that the treatment will also be dependent on availability of resources and existing treatment guidelines, considered by other Pain Management Practitioners as being common knowledge and practice, at the time of the intervention. For Medico-Legal purposes, it is also important to point out that variation in procedural techniques and pharmacological choices are the acceptable norm. The indications, contraindications, technique, and results of the above procedure should only be interpreted and judged by a Board-Certified Interventional Pain Specialist with extensive familiarity and expertise in the same exact procedure and technique. Attempts at providing opinions without similar or greater experience and expertise than that of the treating physician will be considered as inappropriate and unethical, and shall result in a formal complaint to the state medical board and applicable specialty societies.

## 2015-12-02 NOTE — Progress Notes (Signed)
Patient here for procedure d/t low back pain. Safety precautions to be maintained throughout the outpatient stay will include: orient to surroundings, keep bed in low position, maintain call bell within reach at all times, provide assistance with transfer out of bed and ambulation.

## 2015-12-03 ENCOUNTER — Telehealth: Payer: Self-pay | Admitting: *Deleted

## 2015-12-03 NOTE — Telephone Encounter (Signed)
No problems post procedure. 

## 2015-12-04 ENCOUNTER — Other Ambulatory Visit: Payer: Self-pay | Admitting: Family Medicine

## 2015-12-04 DIAGNOSIS — I5032 Chronic diastolic (congestive) heart failure: Secondary | ICD-10-CM | POA: Diagnosis not present

## 2015-12-04 DIAGNOSIS — I10 Essential (primary) hypertension: Secondary | ICD-10-CM | POA: Diagnosis not present

## 2015-12-04 DIAGNOSIS — I4892 Unspecified atrial flutter: Secondary | ICD-10-CM | POA: Diagnosis not present

## 2015-12-04 DIAGNOSIS — J410 Simple chronic bronchitis: Secondary | ICD-10-CM | POA: Diagnosis not present

## 2015-12-04 DIAGNOSIS — I495 Sick sinus syndrome: Secondary | ICD-10-CM | POA: Diagnosis not present

## 2015-12-04 MED ORDER — LISINOPRIL 10 MG PO TABS: 10.0000 mg | ORAL_TABLET | Freq: Every day | ORAL | Status: DC

## 2015-12-09 ENCOUNTER — Telehealth: Payer: Self-pay | Admitting: Family Medicine

## 2015-12-09 NOTE — Telephone Encounter (Signed)
Kristin [name] at [facility] has a question about pt's lisinopril dosage.  Please call [phone]

## 2015-12-09 NOTE — Telephone Encounter (Signed)
Called patient and pharmacy confirmed patient has been taking 1/2 of Lisinopril 10 mg.Kristin [name]

## 2015-12-10 DIAGNOSIS — J45998 Other asthma: Secondary | ICD-10-CM | POA: Diagnosis not present

## 2015-12-16 ENCOUNTER — Other Ambulatory Visit: Payer: Self-pay | Admitting: Family Medicine

## 2015-12-16 ENCOUNTER — Ambulatory Visit
Admission: RE | Admit: 2015-12-16 | Discharge: 2015-12-16 | Disposition: A | Discharge status: Home / Self Care | Payer: Medicare Other | Source: Ambulatory Visit | Attending: Cardiology | Admitting: Cardiology

## 2015-12-16 ENCOUNTER — Encounter
Admission: RE | Admit: 2015-12-16 | Discharge: 2015-12-16 | Disposition: A | Discharge status: Home / Self Care | Payer: Medicare Other | Source: Ambulatory Visit | Attending: Cardiology | Admitting: Cardiology

## 2015-12-16 DIAGNOSIS — I700 Atherosclerosis of aorta: Secondary | ICD-10-CM | POA: Insufficient documentation

## 2015-12-16 DIAGNOSIS — J439 Emphysema, unspecified: Secondary | ICD-10-CM | POA: Diagnosis not present

## 2015-12-16 DIAGNOSIS — N3000 Acute cystitis without hematuria: Secondary | ICD-10-CM

## 2015-12-16 DIAGNOSIS — N390 Urinary tract infection, site not specified: Secondary | ICD-10-CM

## 2015-12-16 DIAGNOSIS — Z950 Presence of cardiac pacemaker: Secondary | ICD-10-CM

## 2015-12-16 DIAGNOSIS — Z01818 Encounter for other preprocedural examination: Secondary | ICD-10-CM | POA: Diagnosis not present

## 2015-12-16 HISTORY — DX: Dorsalgia, unspecified: M54.9

## 2015-12-16 HISTORY — DX: Restless legs syndrome: G25.81

## 2015-12-16 HISTORY — DX: Pressure ulcer of unspecified site, unspecified stage: L89.90

## 2015-12-16 HISTORY — DX: Overactive bladder: N32.81

## 2015-12-16 HISTORY — DX: Unspecified atrial fibrillation: I48.91

## 2015-12-16 LAB — PROTIME-INR
INR: 1.24
Prothrombin Time: 15.8 seconds — ABNORMAL HIGH (ref 11.4–15.0)

## 2015-12-16 LAB — BASIC METABOLIC PANEL
Anion gap: 8 (ref 5–15)
BUN: 22 mg/dL — AB (ref 6–20)
CALCIUM: 9.4 mg/dL (ref 8.9–10.3)
CO2: 23 mmol/L (ref 22–32)
CREATININE: 1.26 mg/dL — AB (ref 0.44–1.00)
Chloride: 107 mmol/L (ref 101–111)
GFR calc Af Amer: 44 mL/min — ABNORMAL LOW (ref >60)
GFR, EST NON AFRICAN AMERICAN: 38 mL/min — AB (ref >60)
GLUCOSE: 88 mg/dL (ref 65–99)
Potassium: 4.3 mmol/L (ref 3.5–5.1)
Sodium: 138 mmol/L (ref 135–145)

## 2015-12-16 LAB — CBC WITH DIFFERENTIAL/PLATELET
BASOS ABS: 0 10*3/uL (ref 0–0.1)
Basophils Relative: 1 %
EOS PCT: 5 %
Eosinophils Absolute: 0.3 10*3/uL (ref 0–0.7)
HEMATOCRIT: 36.7 % (ref 35.0–47.0)
Hemoglobin: 12.1 g/dL (ref 12.0–16.0)
LYMPHS ABS: 1.7 10*3/uL (ref 1.0–3.6)
LYMPHS PCT: 25 %
MCH: 30.3 pg (ref 26.0–34.0)
MCHC: 33.1 g/dL (ref 32.0–36.0)
MCV: 91.4 fL (ref 80.0–100.0)
MONO ABS: 0.8 10*3/uL (ref 0.2–0.9)
Monocytes Relative: 12 %
NEUTROS ABS: 3.9 10*3/uL (ref 1.4–6.5)
Neutrophils Relative %: 57 %
PLATELETS: 156 10*3/uL (ref 150–440)
RBC: 4.01 MIL/uL (ref 3.80–5.20)
RDW: 14.8 % — AB (ref 11.5–14.5)
WBC: 6.8 10*3/uL (ref 3.6–11.0)

## 2015-12-16 LAB — SURGICAL PCR SCREEN
MRSA, PCR: NEGATIVE
Staphylococcus aureus: NEGATIVE

## 2015-12-16 LAB — APTT: APTT: 26 s (ref 24–36)

## 2015-12-16 NOTE — Patient Instructions (Addendum)
  Your procedure is scheduled on: 12/24/15 Wed  Report to Same Day Surgery 2nd floor medical mall To find out your arrival time please call [phone] between 1PM - 3PM on  12/23/15 Tues   Remember: Instructions that are not followed completely may result in serious medical risk, up to and including death, or upon the discretion of your surgeon and anesthesiologist your surgery may need to be rescheduled.    _x___ 1. Do not eat food or drink liquids after midnight. No gum chewing or hard candies.     __x__ 2. No Alcohol for 24 hours before or after surgery.   ____ 3. Bring all medications with you on the day of surgery if instructed.    __x__ 4. Notify your doctor if there is any change in your medical condition     (cold, fever, infections).     Do not wear jewelry, make-up, hairpins, clips or nail polish.  Do not wear lotions, powders, or perfumes. You may wear deodorant.  Do not shave 48 hours prior to surgery. Men may shave face and neck.  Do not bring valuables to the hospital.    [facility] is not responsible for any belongings or valuables.               Contacts, dentures or bridgework may not be worn into surgery.  Leave your suitcase in the car. After surgery it may be brought to your room.  For patients admitted to the hospital, discharge time is determined by your treatment team.   Patients discharged the day of surgery will not be allowed to drive home.    Please read over the following fact sheets that you were given:   [facility] Preparing for Surgery and or MRSA Information   _x___ Take these medicines the morning of surgery with A SIP OF WATER:    1. albuterol (PROVENTIL) (2.5 MG/3ML) 0.083% nebulizer solutio  2.lisinopril (PRINIVIL,ZESTRIL) 10 MG tablet  3.omeprazole (PRILOSEC) 20 MG capsule  4.  5.  6.  ____ Fleet Enema (as directed)   __x__ Use CHG Soap or sage wipes as directed on instruction sheet   _x__ Use inhalers on the day of surgery and bring  to hospital day of surgery  ____ Stop metformin 2 days prior to surgery    ____ Take 1/2 of usual insulin dose the night before surgery and none on the morning of           surgery.   _x___ Stop aspirin or coumadin, or plavix stop Eliquis 5 days before surgery and stop aspirin on the 27th as instructed by Doctor  _x__ Stop Anti-inflammatories such as Advil, Aleve, Ibuprofen, Motrin, Naproxen,          Naprosyn, Goodies powders or aspirin products. Ok to take Tylenol.   ____ Stop supplements until after surgery.    ____ Bring C-Pap to the hospital.

## 2015-12-17 ENCOUNTER — Other Ambulatory Visit: Payer: Self-pay | Admitting: Family Medicine

## 2015-12-18 ENCOUNTER — Encounter: Payer: Self-pay | Admitting: Pain Medicine

## 2015-12-18 ENCOUNTER — Ambulatory Visit: Payer: Medicare Other | Attending: Pain Medicine | Admitting: Pain Medicine

## 2015-12-18 VITALS — BP 128/69 | HR 49 | Temp 98.4°F | Resp 16 | Ht 69.0 in | Wt 177.0 lb

## 2015-12-18 DIAGNOSIS — J449 Chronic obstructive pulmonary disease, unspecified: Secondary | ICD-10-CM | POA: Diagnosis not present

## 2015-12-18 DIAGNOSIS — K5903 Drug induced constipation: Secondary | ICD-10-CM | POA: Diagnosis not present

## 2015-12-18 DIAGNOSIS — T402X5A Adverse effect of other opioids, initial encounter: Secondary | ICD-10-CM

## 2015-12-18 DIAGNOSIS — I341 Nonrheumatic mitral (valve) prolapse: Secondary | ICD-10-CM | POA: Diagnosis not present

## 2015-12-18 DIAGNOSIS — M810 Age-related osteoporosis without current pathological fracture: Secondary | ICD-10-CM | POA: Diagnosis not present

## 2015-12-18 DIAGNOSIS — G2581 Restless legs syndrome: Secondary | ICD-10-CM | POA: Insufficient documentation

## 2015-12-18 DIAGNOSIS — R7303 Prediabetes: Secondary | ICD-10-CM | POA: Insufficient documentation

## 2015-12-18 DIAGNOSIS — Z87891 Personal history of nicotine dependence: Secondary | ICD-10-CM | POA: Diagnosis not present

## 2015-12-18 DIAGNOSIS — K5900 Constipation, unspecified: Secondary | ICD-10-CM

## 2015-12-18 DIAGNOSIS — Z79891 Long term (current) use of opiate analgesic: Secondary | ICD-10-CM | POA: Insufficient documentation

## 2015-12-18 DIAGNOSIS — R350 Frequency of micturition: Secondary | ICD-10-CM | POA: Insufficient documentation

## 2015-12-18 DIAGNOSIS — R55 Syncope and collapse: Secondary | ICD-10-CM | POA: Insufficient documentation

## 2015-12-18 DIAGNOSIS — K219 Gastro-esophageal reflux disease without esophagitis: Secondary | ICD-10-CM | POA: Insufficient documentation

## 2015-12-18 DIAGNOSIS — M4316 Spondylolisthesis, lumbar region: Secondary | ICD-10-CM | POA: Insufficient documentation

## 2015-12-18 DIAGNOSIS — R7309 Other abnormal glucose: Secondary | ICD-10-CM | POA: Diagnosis not present

## 2015-12-18 DIAGNOSIS — M5137 Other intervertebral disc degeneration, lumbosacral region: Secondary | ICD-10-CM | POA: Insufficient documentation

## 2015-12-18 DIAGNOSIS — G8929 Other chronic pain: Secondary | ICD-10-CM | POA: Insufficient documentation

## 2015-12-18 DIAGNOSIS — M533 Sacrococcygeal disorders, not elsewhere classified: Secondary | ICD-10-CM | POA: Insufficient documentation

## 2015-12-18 DIAGNOSIS — M109 Gout, unspecified: Secondary | ICD-10-CM | POA: Diagnosis not present

## 2015-12-18 DIAGNOSIS — I5032 Chronic diastolic (congestive) heart failure: Secondary | ICD-10-CM | POA: Diagnosis not present

## 2015-12-18 DIAGNOSIS — Z7901 Long term (current) use of anticoagulants: Secondary | ICD-10-CM | POA: Diagnosis not present

## 2015-12-18 DIAGNOSIS — Z5181 Encounter for therapeutic drug level monitoring: Secondary | ICD-10-CM | POA: Diagnosis not present

## 2015-12-18 DIAGNOSIS — M75102 Unspecified rotator cuff tear or rupture of left shoulder, not specified as traumatic: Secondary | ICD-10-CM | POA: Diagnosis not present

## 2015-12-18 DIAGNOSIS — C50911 Malignant neoplasm of unspecified site of right female breast: Secondary | ICD-10-CM | POA: Diagnosis not present

## 2015-12-18 DIAGNOSIS — I129 Hypertensive chronic kidney disease with stage 1 through stage 4 chronic kidney disease, or unspecified chronic kidney disease: Secondary | ICD-10-CM | POA: Insufficient documentation

## 2015-12-18 DIAGNOSIS — M4806 Spinal stenosis, lumbar region: Secondary | ICD-10-CM | POA: Insufficient documentation

## 2015-12-18 DIAGNOSIS — M549 Dorsalgia, unspecified: Secondary | ICD-10-CM | POA: Diagnosis present

## 2015-12-18 DIAGNOSIS — I4892 Unspecified atrial flutter: Secondary | ICD-10-CM | POA: Insufficient documentation

## 2015-12-18 DIAGNOSIS — Z8614 Personal history of Methicillin resistant Staphylococcus aureus infection: Secondary | ICD-10-CM | POA: Diagnosis not present

## 2015-12-18 DIAGNOSIS — M961 Postlaminectomy syndrome, not elsewhere classified: Secondary | ICD-10-CM | POA: Insufficient documentation

## 2015-12-18 DIAGNOSIS — K5909 Other constipation: Secondary | ICD-10-CM

## 2015-12-18 DIAGNOSIS — F1190 Opioid use, unspecified, uncomplicated: Secondary | ICD-10-CM | POA: Diagnosis not present

## 2015-12-18 MED ORDER — MAGNESIUM OXIDE 400 MG PO TABS: 400.0000 mg | ORAL_TABLET | Freq: Every day | ORAL | Status: DC

## 2015-12-18 MED ORDER — BENEFIBER PO POWD: ORAL | Status: DC

## 2015-12-18 MED ORDER — OXYCODONE HCL 20 MG PO TABS: 20.0000 mg | ORAL_TABLET | Freq: Three times a day (TID) | ORAL | Status: DC | PRN

## 2015-12-18 MED ORDER — BISACODYL 5 MG PO TBEC: 10.0000 mg | DELAYED_RELEASE_TABLET | Freq: Every day | ORAL | Status: DC | PRN

## 2015-12-18 NOTE — Progress Notes (Signed)
Patient's Name: Kristin [name]  Patient type: Established  MRN: [id number]  Service setting: Ambulatory outpatient  DOB: [birth year]  Location: [facility]  DOS: 12/18/2015  Primary Care Physician: Dicky Doe, MD  Note by: Kathlen Brunswick. Dossie Arbour, M.D, DABA, DABAPM, DABPM, DABIPP, FIPP  Referring Physician: Arlis Porta., MD  Specialty: Board-Certified Interventional Pain Management     Primary Reason(s) for Visit: Encounter for prescription drug management (Level of risk: moderate). In addition the patient returns for follow-up after a procedure. CC: Back Pain   HPI  Kristin [name] is a 82 y.o. year old, female patient, who returns today as an established patient. She has Abnormal glucose tolerance test; Chronic diastolic heart failure ([place]); Chronic kidney disease (CKD), stage III (moderate); CAFL (chronic airflow limitation) ([place]); Complete rotator cuff rupture of shoulder (Left); CCF (congestive cardiac failure) (HCC); DDD (degenerative disc disease), lumbosacral; Gout; Essential (primary) hypertension; Benign essential HTN; BP (high blood pressure); H/O cardiac catheterization; Arrhythmia, sinus node ([place]); Upper respiratory infection, viral; Lumbar canal stenosis; Urinary frequency; Chronic obstructive pulmonary disease ([place]); Congestive heart failure (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[place]); History of cardiac catheterization; Sinoatrial node dysfunction ([place]); Need for influenza vaccination; Frequency of urination; Seasonal allergies; Breast cancer, right breast ([place]); Chronic pain; Chronic pain syndrome; Chronic low back pain (Location of Primary Source of Pain) (Bilateral) (L>R); Lumbar facet syndrome (Bilateral) (L>R); Lumbar spondylosis; Chronic lumbar radicular pain (Bilateral) (L>R) (L5 [place]erm[place]); Restless leg syndrome; Myofascial pain; Neuropathic pain; Neurogenic pain; Long term current use of opiate analgesic;  Long term prescription opiate use; Opiate use (90 MME/Day); Encounter for therapeutic drug level monitoring; Chronic constipation; Opioid-induced constipation (OIC); Vitamin D insufficiency; Grade 1 Retrolisthesis of L1 over L2; Lumbar facet hypertrophy (L1-2, L2-3, and L4-5); Stenosis of lateral recess of lumbar spine (L1-2 and L4-5); 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 Her primarily concern today is the Back Pain   Pain Assessment: Self-Reported Pain Score: 5  Reported level is compatible with observation Pain Type: Chronic pain Pain Location: Back Pain Descriptors / Indicators: Aching, Constant Pain Frequency: Constant  The patient comes into the clinics today for pharmacological management of her chronic pain. In addition the patient returns to the clinics today for follow-up after a procedure.  Date of Last Visit: 12/02/15 Service Provided on Last Visit: Procedure (BLF)   Post-Procedure Assessment  Procedure done on last visit: Diagnostic bilateral lumbar facet block under fluoroscopic guidance and IV sedation. Side-effects or Adverse reactions: None reported Sedation: Sedation given  Results: Ultra-Short Term Relief (First 1 hour after procedure): 60 %  Analgesia during this period is likely to be Local Anesthetic and/or IV Sedative (Analgesic/Anxiolitic) related Short Term Relief (Initial 4-6 hrs after procedure): 60 % Complete relief  confirms area to be the source of pain Long Term Relief : 60 % (1.5 days) Long-term benefit would suggest an inflammatory etiology to the pain   Current Relief (Now): < 50%  No persistent benefit would suggest no long-term anti-inflammatory benefit from intervention and/or persistent or recurrent aggravating factors Interpretation of Results: Based  on these results, the patient may be a candidate for radiofrequency ablation of the lumbar facets.  Controlled Substance Pharmacotherapy Assessment & REMS (Risk Evaluation and Mitigation Strategy)  Analgesic: Oxycodone IR 20 mg every 8 hours (60 mg/day) Pill Count: The patient ran out of medication and forget to bring her empty bottles. She was informed that she has to bring those from now on. MME/day: 90 mg/day Pharmacokinetics: Onset of action (Liberation/Absorption): Within expected pharmacological parameters Time to Peak effect (Distribution): Timing and results are as within normal expected parameters Duration of action (Metabolism/Excretion): Within normal limits for medication Pharmacodynamics: Analgesic Effect: More than 50% Activity Facilitation: Medication(s) allow patient to sit, stand, walk, and do the basic ADLs Perceived Effectiveness: Described as relatively effective, allowing for increase in activities of daily living (ADL) Side-effects or Adverse reactions: None reported Monitoring: [place] PMP: Online review of the past 51-monthperiod conducted. Compliant with practice rules and regulations UDS Results/interpretation: The patient's last UDS was on 09/24/2015 and it came back within normal limits with no unexpected results. Medication Assessment Form: Reviewed. Patient indicates being compliant with therapy Treatment compliance: Compliant Risk Assessment: Aberrant Behavior: None observed today Substance Use Disorder (SUD) Risk Level: No change since last visit Risk of opioid abuse or dependence: 0.7-3.0% with doses ? 36 MME/day  and 6.1-26% with doses ? 120 MME/day. Opioid Risk Tool (ORT) Score:  3 Low Risk for SUD (Score <3) Depression Scale Score: PHQ-2: PHQ-2 Total Score: 0 No depression (0) PHQ-9: PHQ-9 Total Score: 0 No depression (0-4)  Pharmacologic Plan: Since the patient always takes 2 of the oxycodone IR 10 mg at a time, today I will be switching her to the 20 mg formulation so as to take less pills. The patient was informed to be her for not to take 2 of these at a time. She indicated that she understood and that she would not take 2 at a time of the no pills.  Laboratory Chemistry  Inflammation Markers Lab Results  Component Value Date   ESRSEDRATE 12 09/24/2015   CRP <0.5 09/24/2015    Renal Function Lab Results  Component Value Date   BUN 22* 12/16/2015   CREATININE 1.26* 12/16/2015   GFRAA 44* 12/16/2015   GFRNONAA 38* 12/16/2015    Hepatic Function Lab Results  Component Value Date   AST 24 09/24/2015   ALT 15 09/24/2015   ALBUMIN 4.0 09/24/2015    Electrolytes Lab Results  Component Value Date   NA 138 12/16/2015   K 4.3 12/16/2015   CL 107 12/16/2015   CALCIUM 9.4 12/16/2015   MG 2.2 09/24/2015    Pain Modulating Vitamins No results found for: VD25OH, VD125OH2TOT, VOA4166AY3 VKZ6010XN2 VITAMINB12  Coagulation Parameters Lab Results  Component Value Date   INR 1.24 12/16/2015   LABPROT 15.8* 12/16/2015    Note: I personally reviewed the above data. Results shared with patient.  Meds  The patient has a current medication list which includes the following prescription(s): albuterol, apixaban, apixaban, bisacodyl, calcium citrate-vitamin d, vitamin d3, furosemide, levocetirizine, lisinopril, magnesium oxide, omeprazole, oxygen-helium, ropinirole, benefiber, aspirin ec, oxycodone hcl, oxycodone hcl, and oxycodone hcl.  Current Outpatient Prescriptions on File Prior to Visit  Medication Sig  . albuterol (PROVENTIL) (2.5 MG/3ML) 0.083% nebulizer solution Take 3 mLs (2.5  mg total) by nebulization every 8 (eight) hours as needed. Shortness of breath and wheezing  . apixaban (ELIQUIS) 5 MG TABS tablet Take 1 tablet (5 mg total) by mouth 2 (two) times daily.  .Marland Kitchen  calcium citrate-vitamin D (CITRACAL+D) 315-200 MG-UNIT per tablet Take 1 tablet by mouth 2 (two) times daily.  . Cholecalciferol (VITAMIN D3) 2000 units TABS Take 1 tablet by mouth daily.  . furosemide (LASIX) 20 MG tablet Take 1 tablet (20 mg total) by mouth daily.  Marland Kitchen levocetirizine (XYZAL) 5 MG tablet Take 1 tablet (5 mg total) by mouth every evening.  Marland Kitchen lisinopril (PRINIVIL,ZESTRIL) 10 MG tablet Take 1 tablet (10 mg total) by mouth daily.  Marland Kitchen omeprazole (PRILOSEC) 20 MG capsule TAKE (1) CAPSULE BY MOUTH ONCE DAILY.  Marland Kitchen OXYGEN Inhale 2 Doses into the lungs at bedtime.  Marland Kitchen rOPINIRole (REQUIP) 2 MG tablet Take 1 tablet (2 mg total) by mouth at bedtime.   No current facility-administered medications on file prior to visit.    ROS  Constitutional: Afebrile, no chills, well hydrated and well nourished Gastrointestinal: No upper or lower GI bleeding, no nausea, no vomiting and no acute GI distress Musculoskeletal: No acute joint swelling or redness, no acute loss of range of motion and no acute onset weakness Neurological: Denies any acute onset apraxia, no episodes of paralysis, no acute loss of coordination, no acute loss of consciousness and no acute onset aphasia, dysarthria, agnosia, or amnesia  Allergies  Ms. Shearman is allergic to flexeril and vancomycin.  [place]  Medical:  Ms. Methot  has a past medical history of Hypertension; Hyperlipidemia; COPD (chronic obstructive pulmonary disease) ([place]); Arthritis; Chronic back pain; CHF (congestive heart failure) ([place]); Hepatitis C; Gout; Arthritis; OAB (overactive bladder); UTI (lower urinary tract infection); Cystocele; Vaginal atrophy; Bradycardia; Hematuria; Urinary frequency; Hypomagnesemia (06/25/2015); Cancer [facility]) (2004); Back pain; CKD (chronic kidney  disease); Overactive bladder; Atrial fibrillation ([place]); Decubitus ulcers; and Restless leg. Family: family history includes Breast cancer in her paternal aunt; Breast cancer (age of onset: 70) in her sister; Cancer in her sister; Heart disease in her father and mother; Stroke in her mother. There is no history of Anesthesia problems, Hypotension, Malignant hyperthermia, or Pseudochol deficiency. Surgical:  has past surgical history that includes Abdominal hysterectomy; Back surgery; Rotator cuff repair; Breast surgery; Cataract extraction w/PHACO (10/19/2011); Cataract extraction w/PHACO (11/02/2011); Carpal tunnel release; Joint replacement; and Colon surgery. Tobacco:  reports that she quit smoking about 17 years ago. Her smoking use included Cigarettes. She has a 50 pack-year smoking history. She does not have any smokeless tobacco history on file. Alcohol:  reports that she does not drink alcohol. Drug:  reports that she does not use illicit drugs.  Physical Examination  Constitutional Vitals: Blood pressure 128/69, pulse 49, temperature 98.4 F (36.9 C), temperature source Oral, resp. rate 16, height '5\' 9"'$  (1.753 m), weight 177 lb (80.287 kg), SpO2 97 %. Calculated BMI: Body mass index is 26.13 kg/(m^2). (25-29.9 kg/m2) Overweight - 20% higher incidence of chronic pain General appearance: Alert, cooperative, oriented x 3, in no acute distress, well nourished, well developed, well hydrated Eyes: PERLA Respiratory: No evidence respiratory distress, no audible rales or ronchi and no use of accessory muscles of respiration Psych: Alert, oriented to person, oriented to place and oriented to time  Cervical Spine Exam  Inspection: Normal anatomy, no anomalies observed Cervical Lordosis: Normal Alignment: Symetrical Functional ROM: Within functional limits [facility]) AROM: WFL Sensory: No sensory anomalies reported or detected  Upper Extremity Exam    Right  Left  Inspection: No gross anomalies  detected  Inspection: No gross anomalies detected  Functional ROM: Adequate  Functional ROM: Adequate  AROM: Adequate  AROM: Adequate  Sensory: No sensory  anomalies reported or detected  Sensory: No sensory anomalies reported or detected  Motor: Unremarkable  Motor: Unremarkable  Vascular: Normal skin color, temperature, and hair growth. No peripheral edema or cyanosis  Vascular: Normal skin color, temperature, and hair growth. No peripheral edema or cyanosis   Thoracic Spine  Inspection: No gross anomalies detected Alignment: Symetrical Functional ROM: Within functional limits [facility]) AROM: Adequate Palpation: WNL  Lumbar Spine  Inspection: No gross anomalies detected Alignment: Symetrical Functional ROM: Within functional limits (WFL) AROM: Decreased Sensory: No sensory anomalies reported or detected Palpation: Tender Provocative Tests: Lumbar Hyperextension and rotation test: Positive for bilateral lumbar facet pain Patrick's Maneuver: deferred  Gait Assessment  Gait: Antalgic (limping)  Lower Extremities    Right  Left  Inspection: No gross anomalies detected  Inspection: No gross anomalies detected  Functional ROM: Within functional limits [facility])  Functional ROM: Within functional limits (WFL)  AROM: Adequate  AROM: Adequate  Sensory: No sensory anomalies reported or detected  Sensory: No sensory anomalies reported or detected  Motor: Unremarkable  Motor: Unremarkable  Toe walk (S1): WNL  Toe walk (S1): WNL  Heal walk (L5): WNL  Heal walk (L5): WNL   Assessment & Plan  Primary Diagnosis & Pertinent Problem List: The primary encounter diagnosis was Chronic pain. Diagnoses of Encounter for therapeutic drug level monitoring, Long term current use of opiate analgesic, Opiate use (90 MME/Day), Opioid-induced constipation (OIC), Chronic constipation, and Gastroesophageal reflux disease without esophagitis were also pertinent to this visit.  Visit Diagnosis: 1. Chronic pain    2. Encounter for therapeutic drug level monitoring   3. Long term current use of opiate analgesic   4. Opiate use (90 MME/Day)   5. Opioid-induced constipation (OIC)   6. Chronic constipation   7. Gastroesophageal reflux disease without esophagitis     Problems updated and reviewed during this visit: Problem  Breast Cancer, Right Breast (Hcc)  Disorder of Rotator Cuff  Opiate use (90 MME/Day)   Oxycodone IR 20 mg every 8 hours (60 mg/day)   Opioid-induced constipation (OIC)  Gerd (Gastroesophageal Reflux Disease)  Mitral Valve Prolapse    Problem-specific Plan(s): No problem-specific assessment & plan notes found for this encounter.  No new assessment & plan notes have been filed under this hospital service since the last note was generated. Service: Pain Management   Plan of Care   Problem List Items Addressed This Visit      High   Chronic pain - Primary (Chronic)   Relevant Medications   aspirin EC 325 MG tablet   Oxycodone HCl 20 MG TABS   Oxycodone HCl 20 MG TABS   Oxycodone HCl 20 MG TABS     Medium   Encounter for therapeutic drug level monitoring   Long term current use of opiate analgesic (Chronic)   Relevant Orders   ToxASSURE Select 13 (MW), Urine   Opiate use (90 MME/Day) (Chronic)   Opioid-induced constipation (OIC) (Chronic)   Relevant Medications   bisacodyl (DULCOLAX) 5 MG EC tablet   Wheat Dextrin (BENEFIBER) POWD     Low   Chronic constipation (Chronic)   Relevant Medications   bisacodyl (DULCOLAX) 5 MG EC tablet   Wheat Dextrin (BENEFIBER) POWD   GERD (gastroesophageal reflux disease) (Chronic)   Relevant Medications   bisacodyl (DULCOLAX) 5 MG EC tablet   magnesium oxide (MAG-OX) 400 MG tablet   Wheat Dextrin (BENEFIBER) POWD       Pharmacotherapy (Medications Ordered): Meds ordered this encounter  Medications  .  bisacodyl (DULCOLAX) 5 MG EC tablet    Sig: Take 2 tablets (10 mg total) by mouth daily as needed for moderate  constipation.    Dispense:  60 tablet    Refill:  2    Do not place this medication, or any other prescription from our practice, on "Automatic Refill". Patient may have prescription filled one day early if pharmacy is closed on scheduled refill date.  . magnesium oxide (MAG-OX) 400 MG tablet    Sig: Take 1 tablet (400 mg total) by mouth daily.    Dispense:  30 tablet    Refill:  2    Do not place this medication, or any other prescription from our practice, on "Automatic Refill". Patient may have prescription filled one day early if pharmacy is closed on scheduled refill date.  . Wheat Dextrin (BENEFIBER) POWD    Sig: Stir 2 teaspoons of Benefiber into 4-8 oz of any non-carbonated beverage or soft food (hot or cold) PO TID    Dispense:  500 g    Refill:  2    This is an OTC product. This prescription is to serve as a reminder to the patient as to our preference.  . Oxycodone HCl 20 MG TABS    Sig: Take 1 tablet (20 mg total) by mouth every 8 (eight) hours as needed.    Dispense:  90 tablet    Refill:  0    Do not add this medication to the electronic "Automatic Refill" notification system. Patient may have prescription filled one day early if pharmacy is closed on scheduled refill date. Do not fill until: 12/18/15 To last until: 01/17/16  . Oxycodone HCl 20 MG TABS    Sig: Take 1 tablet (20 mg total) by mouth every 8 (eight) hours as needed.    Dispense:  90 tablet    Refill:  0    Do not add this medication to the electronic "Automatic Refill" notification system. Patient may have prescription filled one day early if pharmacy is closed on scheduled refill date. Do not fill until: 01/17/16 To last until: 02/16/16  . Oxycodone HCl 20 MG TABS    Sig: Take 1 tablet (20 mg total) by mouth every 8 (eight) hours as needed.    Dispense:  90 tablet    Refill:  0    Do not add this medication to the electronic "Automatic Refill" notification system. Patient may have prescription filled one  day early if pharmacy is closed on scheduled refill date. Do not fill until: 02/16/16 To last until: 03/17/16    [facility] & Procedure Ordered: Orders Placed This Encounter  Procedures  . ToxASSURE Select 13 (MW), Urine    Imaging Ordered: None  Interventional Therapies: Scheduled:  None at this time.    Considering:  Possible bilateral lumbar facet radiofrequency.    PRN Procedures:  Diagnostic bilateral lumbar facet block under fluoroscopic guidance and IV sedation for the low back pain.    Referral(s) or Consult(s): None at this time.  New Prescriptions   OXYCODONE HCL 20 MG TABS    Take 1 tablet (20 mg total) by mouth every 8 (eight) hours as needed.   OXYCODONE HCL 20 MG TABS    Take 1 tablet (20 mg total) by mouth every 8 (eight) hours as needed.   OXYCODONE HCL 20 MG TABS    Take 1 tablet (20 mg total) by mouth every 8 (eight) hours as needed.    Medications administered during this  visit: Ms. Seubert had no medications administered during this visit.  Future Appointments Date Time Provider [place]  02/19/2016 11:00 AM Arlis Porta., MD [facility] None  02/25/2016 10:00 AM Nori Riis, PA-C [facility] None  03/17/2016 11:00 AM Milinda Pointer, MD [facility] None    Primary Care Physician: Dicky Doe, MD Location: [facility] Note by: Kathlen Brunswick. Dossie Arbour, M.D, DABA, DABAPM, DABPM, DABIPP, FIPP  Pain Score Disclaimer: We use the NRS-11 scale. This is a self-reported, subjective measurement of pain severity with only modest accuracy. It is used primarily to identify changes within a particular patient. It must be understood that outpatient pain scales are significantly less accurate that those used for research, where they can be applied under ideal controlled circumstances with minimal exposure to variables. In reality, the score is likely to be a combination of pain intensity and pain affect, where pain affect  describes the degree of emotional arousal or changes in action readiness caused by the sensory experience of pain. Factors such as social and work situation, setting, emotional state, anxiety levels, expectation, and prior pain experience may influence pain perception and show large inter-individual differences that may also be affected by time variables.

## 2015-12-18 NOTE — Progress Notes (Signed)
Safety precautions to be maintained throughout the outpatient stay will include: orient to surroundings, keep bed in low position, maintain call bell within reach at all times, provide assistance with transfer out of bed and ambulation.  

## 2015-12-19 LAB — CULTURE, URINE COMPREHENSIVE

## 2015-12-22 ENCOUNTER — Ambulatory Visit: Payer: Commercial Managed Care - HMO | Admitting: Family Medicine

## 2015-12-23 ENCOUNTER — Other Ambulatory Visit: Payer: Self-pay | Admitting: Pain Medicine

## 2015-12-24 ENCOUNTER — Ambulatory Visit: Payer: Medicare Other | Admitting: Anesthesiology

## 2015-12-24 ENCOUNTER — Observation Stay: Payer: Medicare Other

## 2015-12-24 ENCOUNTER — Ambulatory Visit
Admission: RE | Admit: 2015-12-24 | Discharge: 2015-12-25 | Disposition: A | Discharge status: Home / Self Care | Payer: Medicare Other | Source: Ambulatory Visit | Attending: Cardiology | Admitting: Cardiology

## 2015-12-24 ENCOUNTER — Ambulatory Visit: Payer: Medicare Other

## 2015-12-24 ENCOUNTER — Encounter: Payer: Self-pay | Admitting: *Deleted

## 2015-12-24 ENCOUNTER — Encounter
Admission: RE | Disposition: A | Discharge status: Home / Self Care | Payer: Self-pay | Source: Ambulatory Visit | Attending: Cardiology

## 2015-12-24 DIAGNOSIS — M109 Gout, unspecified: Secondary | ICD-10-CM | POA: Diagnosis not present

## 2015-12-24 DIAGNOSIS — I4892 Unspecified atrial flutter: Secondary | ICD-10-CM | POA: Diagnosis not present

## 2015-12-24 DIAGNOSIS — Z87891 Personal history of nicotine dependence: Secondary | ICD-10-CM | POA: Diagnosis not present

## 2015-12-24 DIAGNOSIS — I480 Paroxysmal atrial fibrillation: Secondary | ICD-10-CM | POA: Insufficient documentation

## 2015-12-24 DIAGNOSIS — M1990 Unspecified osteoarthritis, unspecified site: Secondary | ICD-10-CM | POA: Diagnosis not present

## 2015-12-24 DIAGNOSIS — R001 Bradycardia, unspecified: Secondary | ICD-10-CM | POA: Diagnosis not present

## 2015-12-24 DIAGNOSIS — N189 Chronic kidney disease, unspecified: Secondary | ICD-10-CM | POA: Diagnosis not present

## 2015-12-24 DIAGNOSIS — Z79891 Long term (current) use of opiate analgesic: Secondary | ICD-10-CM | POA: Insufficient documentation

## 2015-12-24 DIAGNOSIS — Z950 Presence of cardiac pacemaker: Secondary | ICD-10-CM | POA: Diagnosis not present

## 2015-12-24 DIAGNOSIS — Z79899 Other long term (current) drug therapy: Secondary | ICD-10-CM | POA: Insufficient documentation

## 2015-12-24 DIAGNOSIS — I509 Heart failure, unspecified: Secondary | ICD-10-CM | POA: Diagnosis not present

## 2015-12-24 DIAGNOSIS — I340 Nonrheumatic mitral (valve) insufficiency: Secondary | ICD-10-CM | POA: Diagnosis not present

## 2015-12-24 DIAGNOSIS — E785 Hyperlipidemia, unspecified: Secondary | ICD-10-CM | POA: Insufficient documentation

## 2015-12-24 DIAGNOSIS — I129 Hypertensive chronic kidney disease with stage 1 through stage 4 chronic kidney disease, or unspecified chronic kidney disease: Secondary | ICD-10-CM | POA: Diagnosis not present

## 2015-12-24 DIAGNOSIS — Z96653 Presence of artificial knee joint, bilateral: Secondary | ICD-10-CM | POA: Diagnosis not present

## 2015-12-24 DIAGNOSIS — I10 Essential (primary) hypertension: Secondary | ICD-10-CM | POA: Diagnosis not present

## 2015-12-24 DIAGNOSIS — I495 Sick sinus syndrome: Secondary | ICD-10-CM | POA: Diagnosis not present

## 2015-12-24 DIAGNOSIS — E1122 Type 2 diabetes mellitus with diabetic chronic kidney disease: Secondary | ICD-10-CM | POA: Insufficient documentation

## 2015-12-24 DIAGNOSIS — Z853 Personal history of malignant neoplasm of breast: Secondary | ICD-10-CM | POA: Insufficient documentation

## 2015-12-24 DIAGNOSIS — I2510 Atherosclerotic heart disease of native coronary artery without angina pectoris: Secondary | ICD-10-CM | POA: Insufficient documentation

## 2015-12-24 DIAGNOSIS — Z7901 Long term (current) use of anticoagulants: Secondary | ICD-10-CM | POA: Insufficient documentation

## 2015-12-24 DIAGNOSIS — Z86010 Personal history of colonic polyps: Secondary | ICD-10-CM | POA: Diagnosis not present

## 2015-12-24 DIAGNOSIS — Z96612 Presence of left artificial shoulder joint: Secondary | ICD-10-CM | POA: Diagnosis not present

## 2015-12-24 DIAGNOSIS — J449 Chronic obstructive pulmonary disease, unspecified: Secondary | ICD-10-CM | POA: Insufficient documentation

## 2015-12-24 DIAGNOSIS — Z9889 Other specified postprocedural states: Secondary | ICD-10-CM

## 2015-12-24 DIAGNOSIS — I5032 Chronic diastolic (congestive) heart failure: Secondary | ICD-10-CM | POA: Diagnosis not present

## 2015-12-24 DIAGNOSIS — I498 Other specified cardiac arrhythmias: Secondary | ICD-10-CM | POA: Diagnosis not present

## 2015-12-24 HISTORY — PX: PACEMAKER INSERTION: SHX728

## 2015-12-24 LAB — TOXASSURE SELECT 13 (MW), URINE

## 2015-12-24 SURGERY — INSERTION, CARDIAC PACEMAKER
Anesthesia: Monitor Anesthesia Care

## 2015-12-24 MED ORDER — LISINOPRIL 10 MG PO TABS: 10.0000 mg | ORAL_TABLET | Freq: Every day | ORAL | Status: DC

## 2015-12-24 MED ORDER — CEFAZOLIN SODIUM 1-5 GM-% IV SOLN
1.0000 g | Freq: Four times a day (QID) | INTRAVENOUS | Status: DC
  Administered 2015-12-24 – 2015-12-25 (×2): 1 g via INTRAVENOUS
  Filled 2015-12-24 (×3): qty 50

## 2015-12-24 MED ORDER — FENTANYL CITRATE (PF) 100 MCG/2ML IJ SOLN: 25.0000 ug | INTRAMUSCULAR | Status: DC | PRN

## 2015-12-24 MED ORDER — ACETAMINOPHEN 325 MG PO TABS
325.0000 mg | ORAL_TABLET | ORAL | Status: DC | PRN
Start: 2015-12-24 — End: 2015-12-25

## 2015-12-24 MED ORDER — ONDANSETRON HCL 4 MG/2ML IJ SOLN: 4.0000 mg | Freq: Four times a day (QID) | INTRAMUSCULAR | Status: DC | PRN

## 2015-12-24 MED ORDER — SODIUM CHLORIDE 0.9 % IJ SOLN
INTRAMUSCULAR | Status: AC
  Filled 2015-12-24: qty 50

## 2015-12-24 MED ORDER — CEFAZOLIN SODIUM 1-5 GM-% IV SOLN
INTRAVENOUS | Status: AC
  Administered 2015-12-24: 1 g via INTRAVENOUS
  Filled 2015-12-24: qty 50

## 2015-12-24 MED ORDER — ONDANSETRON HCL 4 MG/2ML IJ SOLN: 4.0000 mg | Freq: Once | INTRAMUSCULAR | Status: DC | PRN

## 2015-12-24 MED ORDER — EPHEDRINE SULFATE 50 MG/ML IJ SOLN
INTRAMUSCULAR | Status: DC | PRN
  Administered 2015-12-24 (×2): 5 mg via INTRAVENOUS

## 2015-12-24 MED ORDER — SODIUM CHLORIDE 0.9 % IJ SOLN
INTRAMUSCULAR | Status: DC | PRN
  Administered 2015-12-24: 50 mL via INTRAVENOUS
  Administered 2015-12-24: 5 mL via INTRAVENOUS

## 2015-12-24 MED ORDER — PROPOFOL 500 MG/50ML IV EMUL
INTRAVENOUS | Status: DC | PRN
Start: 2015-12-24 — End: 2015-12-24
  Administered 2015-12-24: 50 ug/kg/min via INTRAVENOUS

## 2015-12-24 MED ORDER — LIDOCAINE HCL (CARDIAC) 20 MG/ML IV SOLN
INTRAVENOUS | Status: DC | PRN
  Administered 2015-12-24: 30 mg via INTRAVENOUS

## 2015-12-24 MED ORDER — FENTANYL CITRATE (PF) 100 MCG/2ML IJ SOLN
INTRAMUSCULAR | Status: DC | PRN
  Administered 2015-12-24: 50 ug via INTRAVENOUS

## 2015-12-24 MED ORDER — SODIUM CHLORIDE 0.9 % IR SOLN
Status: DC | PRN
  Administered 2015-12-24: 50 mL

## 2015-12-24 MED ORDER — LACTATED RINGERS IV SOLN
INTRAVENOUS | Status: DC
  Administered 2015-12-24: 11:00:00 via INTRAVENOUS

## 2015-12-24 MED ORDER — SODIUM CHLORIDE 0.9 % IR SOLN: Freq: Once | Status: DC

## 2015-12-24 MED ORDER — CEFAZOLIN SODIUM 1-5 GM-% IV SOLN
1.0000 g | Freq: Once | INTRAVENOUS | Status: AC
  Administered 2015-12-24: 1 g via INTRAVENOUS

## 2015-12-24 MED ORDER — FUROSEMIDE 20 MG PO TABS
20.0000 mg | ORAL_TABLET | Freq: Every day | ORAL | Status: DC
  Administered 2015-12-24: 20 mg via ORAL
  Filled 2015-12-24: qty 1

## 2015-12-24 MED ORDER — ALBUTEROL SULFATE (2.5 MG/3ML) 0.083% IN NEBU: 2.5000 mg | INHALATION_SOLUTION | Freq: Four times a day (QID) | RESPIRATORY_TRACT | Status: DC | PRN

## 2015-12-24 MED ORDER — CEFAZOLIN SODIUM 1-5 GM-% IV SOLN
INTRAVENOUS | Status: DC | PRN
  Administered 2015-12-24: 1 g via INTRAVENOUS

## 2015-12-24 MED ORDER — LACTATED RINGERS IV SOLN
INTRAVENOUS | Status: DC
  Administered 2015-12-24 (×2): via INTRAVENOUS

## 2015-12-24 MED ORDER — LIDOCAINE 1 % OPTIME INJ - NO CHARGE
INTRAMUSCULAR | Status: DC | PRN
  Administered 2015-12-24: 15 mL

## 2015-12-24 MED ORDER — GENTAMICIN SULFATE 40 MG/ML IJ SOLN
INTRAMUSCULAR | Status: AC
  Filled 2015-12-24: qty 2

## 2015-12-24 MED ORDER — MIDAZOLAM HCL 5 MG/5ML IJ SOLN
INTRAMUSCULAR | Status: DC | PRN
  Administered 2015-12-24: 0.5 mg via INTRAVENOUS

## 2015-12-24 MED ORDER — MAGNESIUM OXIDE 400 (241.3 MG) MG PO TABS: 400.0000 mg | ORAL_TABLET | Freq: Every day | ORAL | Status: DC

## 2015-12-24 MED ORDER — PHENYLEPHRINE HCL 10 MG/ML IJ SOLN
INTRAMUSCULAR | Status: DC | PRN
  Administered 2015-12-24 (×2): 100 ug via INTRAVENOUS

## 2015-12-24 MED ORDER — OXYCODONE HCL 5 MG PO TABS
10.0000 mg | ORAL_TABLET | Freq: Four times a day (QID) | ORAL | Status: DC | PRN
  Administered 2015-12-25: 10 mg via ORAL
  Filled 2015-12-24: qty 2

## 2015-12-24 MED ORDER — ROPINIROLE HCL 0.25 MG PO TABS
0.2500 mg | ORAL_TABLET | Freq: Every day | ORAL | Status: DC
  Administered 2015-12-24: 0.25 mg via ORAL
  Filled 2015-12-24: qty 1

## 2015-12-24 SURGICAL SUPPLY — 38 items
BAG DECANTER FOR FLEXI CONT (MISCELLANEOUS) ×2 IMPLANT
BRUSH SCRUB 4% CHG (MISCELLANEOUS) ×2 IMPLANT
CABLE SURG 12 DISP A/V CHANNEL (MISCELLANEOUS) ×4 IMPLANT
CANISTER SUCT 1200ML W/VALVE (MISCELLANEOUS) ×2 IMPLANT
CHLORAPREP W/TINT 26ML (MISCELLANEOUS) ×2 IMPLANT
COVER LIGHT HANDLE STERIS (MISCELLANEOUS) ×4 IMPLANT
COVER MAYO STAND STRL (DRAPES) ×2 IMPLANT
DRAPE C-ARM XRAY 36X54 (DRAPES) ×2 IMPLANT
DRESSING TELFA 4X3 1S ST N-ADH (GAUZE/BANDAGES/DRESSINGS) ×2 IMPLANT
DRSG TEGADERM 4X4.75 (GAUZE/BANDAGES/DRESSINGS) ×2 IMPLANT
ELECT REM PT RETURN 9FT ADLT (ELECTROSURGICAL) ×2
ELECTRODE REM PT RTRN 9FT ADLT (ELECTROSURGICAL) ×1 IMPLANT
GLOVE BIO SURGEON STRL SZ7.5 (GLOVE) ×8 IMPLANT
GLOVE BIO SURGEON STRL SZ8 (GLOVE) ×8 IMPLANT
GOWN STRL REUS W/ TWL LRG LVL3 (GOWN DISPOSABLE) ×1 IMPLANT
GOWN STRL REUS W/ TWL XL LVL3 (GOWN DISPOSABLE) ×1 IMPLANT
GOWN STRL REUS W/TWL LRG LVL3 (GOWN DISPOSABLE) ×1
GOWN STRL REUS W/TWL XL LVL3 (GOWN DISPOSABLE) ×1
IMMOBILIZER SHDR MD LX WHT (SOFTGOODS) IMPLANT
IMMOBILIZER SHDR XL LX WHT (SOFTGOODS) ×2 IMPLANT
INTRO PACEMAKR LEAD 9FR 13CM (INTRODUCER) ×2
INTRO PACEMKR SHEATH II 7FR (MISCELLANEOUS) ×2
INTRODUCER PACEMKR LD 9FR 13CM (INTRODUCER) ×1 IMPLANT
INTRODUCER PACEMKR SHTH II 7FR (MISCELLANEOUS) ×1 IMPLANT
IV NS 500ML (IV SOLUTION) ×1
IV NS 500ML BAXH (IV SOLUTION) ×1 IMPLANT
KIT RM TURNOVER STRD PROC AR (KITS) ×2 IMPLANT
LABEL OR SOLS (LABEL) ×2 IMPLANT
LEAD CAPSURE NOVUS 5076-52CM (Lead) ×2 IMPLANT
LEAD CAPSURE NOVUS 5076-58CM (Lead) ×2 IMPLANT
MARKER SKIN DUAL TIP RULER LAB (MISCELLANEOUS) ×2 IMPLANT
NEEDLE FILTER BLUNT 18X 1/2SAF (NEEDLE) ×1
NEEDLE FILTER BLUNT 18X1 1/2 (NEEDLE) ×1 IMPLANT
PACK PACE INSERTION (MISCELLANEOUS) ×2 IMPLANT
PAD STATPAD (MISCELLANEOUS) ×2 IMPLANT
PPM ADVISA MRI DR A2DR01 (Pacemaker) ×2 IMPLANT
SUT SILK 0 SH 30 (SUTURE) ×6 IMPLANT
SYR 3ML LL SCALE MARK (SYRINGE) ×2 IMPLANT

## 2015-12-24 NOTE — Op Note (Signed)
[facility]   12/24/2015                     1:35 PM  PATIENT:  Kristin [name]    PRE-OPERATIVE DIAGNOSIS:  BRADYCARDIA  POST-OPERATIVE DIAGNOSIS:  Same  PROCEDURE:  INSERTION PACEMAKER  SURGEON:  Mckenley Birenbaum, MD    ANESTHESIA:     PREOPERATIVE INDICATIONS:  [name] is a  82 y.o. female with a diagnosis of BRADYCARDIA who failed conservative measures and elected for surgical management.    The risks benefits and alternatives were discussed with the patient preoperatively including but not limited to the risks of infection, bleeding, cardiopulmonary complications, the need for revision surgery, among others, and the patient was willing to proceed.   OPERATIVE PROCEDURE: The patient was brought to the operating room the fasting state. The left pectoral region was prepped and draped in usual sterile manner. Anesthesia was obtained 1% lidocaine locally. A 6 cm incision was performed a left pectoral region. Access was obtained to left subclavian vein by fine needle aspiration. MRI compatible leads were positioned into the right ventricular apical septum and right atrial appendage under fluoroscopic guidance. After proper thresholds were obtained the leads were sutured in place. The pacemaker pocket was irrigated with gentamicin solution. The leads were connected to a MRI compatible dual-chamber rate responsive pacemaker generator (Advisa DR MRI A2 DRO 1), and was positioned into the pocket. The pocket was closed with 2-0 and 4-0 Vicryl, respectively. Steri-Strips and a pressure dressing were applied.

## 2015-12-24 NOTE — Transfer of Care (Addendum)
Immediate Anesthesia Transfer of Care Note  Patient: Kristin [name]  Procedure(s) Performed: Procedure(s): INSERTION PACEMAKER (N/A)  Patient Location: PACU  Anesthesia Type:MAC  Level of Consciousness:   Airway & Oxygen Therapy: Patient Spontanous Breathing and Patient connected to face mask oxygen  Post-op Assessment: Report given to RN and Post -op Vital signs reviewed and stable  Post vital signs: Reviewed and stable  Last Vitals:  Filed Vitals:   12/24/15 1039  BP: 100/75  Pulse: 68  Temp: 36.4 C  Resp: 16    Last Pain:  Filed Vitals:   12/24/15 1046  PainSc: 5          Complications: No apparent anesthesia complications

## 2015-12-24 NOTE — Anesthesia Postprocedure Evaluation (Signed)
Anesthesia Post Note  Patient: Kristin [name]  Procedure(s) Performed: Procedure(s) (LRB): INSERTION PACEMAKER (N/A)  Patient location during evaluation: PACU Anesthesia Type: General Level of consciousness: awake Pain management: pain level controlled Vital Signs Assessment: post-procedure vital signs reviewed and stable Respiratory status: spontaneous breathing Cardiovascular status: blood pressure returned to baseline Anesthetic complications: no    Last Vitals:  Filed Vitals:   12/24/15 1333 12/24/15 1336  BP: 120/62 120/62  Pulse: 59 59  Temp: 36.8 C 36.8 C  Resp: 15 13    Last Pain:  Filed Vitals:   12/24/15 1337  PainSc: Asleep                 VAN STAVEREN,Christohper Dube

## 2015-12-24 NOTE — Plan of Care (Signed)
Problem: Phase III Progression Outcomes Goal: Limited arm movement per orders Outcome: Progressing Pt using arm immobolizer, Pt verbalizes understanding in not raising or laying on her L arm.

## 2015-12-24 NOTE — Interval H&P Note (Signed)
History and Physical Interval Note:  12/24/2015 10:23 AM  Kristin [name]  has presented today for surgery, with the diagnosis of BRADYCARDIA  The various methods of treatment have been discussed with the patient and family. After consideration of risks, benefits and other options for treatment, the patient has consented to  Procedure(s): INSERTION PACEMAKER (N/A) as a surgical intervention .  The patient's history has been reviewed, patient examined, no change in status, stable for surgery.  I have reviewed the patient's chart and labs.  Questions were answered to the patient's satisfaction.     Jaydynn Wolford

## 2015-12-24 NOTE — H&P (Signed)
Printout Information Document Contents Office Visit Document Received Date 12/24/2015 Document Source [place] Patient Demographics  Reason for Visit  Encounter Details  Social Hx  Last Filed Vital Signs  Instructions  Progress Notes  Plan of Treatment  Imaging Results  EKG Results  Visit Diagnoses  Document Information Encounter Summary - Kristin [name] (82 y.o. Female)As of May. 03, 2017 Patient Demographics Patient Address Communication Language Race / Ethnicity  [address], [place] [address]  [phone] [facility]) [phone] (Mobile)  English (Preferred) Black or African American / Not Hispanic or Latino  Reason for Visit Reason Comments  Follow-up 2 weeks  Shortness of Breath Exertional  Edema Feet and ankles  Encounter Details Date Type Department Care Team Description  12/04/2015 Office Visit [facility]  [place], [place] [address]  [phone]  Isaias Cowman, [place]  [facility]  [place], [place] [address]  [phone]  [phone] (Fax)  Chronic diastolic CHF (congestive heart failure) (CMS-HCC) (Primary Dx);Essential hypertension, benign;New onset atrial flutter (CMS-HCC);Sinoatrial node dysfunction (CMS-HCC);Simple chronic bronchitis (CMS-HCC);S/P cardiac catheterization  Social History - as of this encounter Tobacco Use Types Packs/Day Years Used Date  Former Smoker Cigarettes   Quit: 12/26/2002   Alcohol Use Drinks/Week oz/Week Comments  No 0 Standard drinks or equivalent  0.0     Birth Sex Date Recorded  Unknown   Last Filed Vital Signs - in this encounter Vital Sign Reading Time Taken  Blood Pressure 110/70 12/04/2015 10:45 AM EDT  Pulse 42 12/04/2015 10:45 AM EDT  Temperature - -  Respiratory Rate - -  Oxygen Saturation - -  Inhaled Oxygen Concentration - -  Weight 80.7 kg (178 lb) 12/04/2015 10:45 AM EDT    Height 175.3 cm ('5\' 9"'$ ) 12/04/2015 10:45 AM EDT  Body Mass Index 26.29 12/04/2015 10:45 AM EDT  Instructions - in this encounter Patient Instructions - Isaias Cowman, MD - 12/04/2015 11:00 AM EDT DASH Eating Plan DASH stands for "Dietary Approaches to Stop Hypertension." The DASH eating plan is a healthy eating plan that has been shown to reduce high blood pressure (hypertension). Additional health benefits may include reducing the risk of type 2 diabetes mellitus, heart disease, and stroke. The DASH eating plan may also help with weight loss. WHAT DO I NEED TO KNOW ABOUT THE DASH EATING PLAN? For the DASH eating plan, you will follow these general guidelines:  Choose foods with a percent daily value for sodium of less than 5% (as listed on the food label).  Use salt-free seasonings or herbs instead of table salt or sea salt.  Check with your health care provider or pharmacist before using salt substitutes.  Eat lower-sodium products, often labeled as "lower sodium" or "no salt added."  Eat fresh foods.  Eat more vegetables, fruits, and low-fat dairy products.  Choose whole grains. Look for the word "whole" as the first word in the ingredient list.  Choose fish and skinless chicken or [place] more often than red meat. Limit fish, poultry, and meat to 6 oz (170 g) each day.  Limit sweets, desserts, sugars, and sugary drinks.  Choose heart-healthy fats.  Limit cheese to 1 oz (28 g) per day.  Eat more home-cooked food and less restaurant, buffet, and fast food.  Limit fried foods.  Cook foods using methods other than frying.  Limit canned vegetables. If you do use them, rinse them well to decrease the sodium.  When eating  at a restaurant, ask that your food be prepared with less salt, or no salt if possible. WHAT FOODS CAN I EAT? Seek help from a dietitian for individual calorie needs. Grains Whole grain or whole wheat bread. Brown rice. Whole grain or whole wheat  pasta. Quinoa, bulgur, and whole grain cereals. Low-sodium cereals. Corn or whole wheat flour tortillas. Whole grain cornbread. Whole grain crackers. Low-sodium crackers. Vegetables Fresh or frozen vegetables (raw, steamed, roasted, or grilled). Low-sodium or reduced-sodium tomato and vegetable juices. Low-sodium or reduced-sodium tomato sauce and paste. Low-sodium or reduced-sodium canned vegetables.  Fruits All fresh, canned (in natural juice), or frozen fruits. Meat and Other Protein Products Ground beef (85% or leaner), grass-fed beef, or beef trimmed of fat. Skinless chicken or [place]. Ground chicken or [place]. Pork trimmed of fat. All fish and seafood. Eggs. Dried beans, peas, or lentils. Unsalted nuts and seeds. Unsalted canned beans. Dairy Low-fat dairy products, such as skim or 1% milk, 2% or reduced-fat cheeses, low-fat ricotta or cottage cheese, or plain low-fat yogurt. Low-sodium or reduced-sodium cheeses. Fats and Oils Tub margarines without trans fats. Light or reduced-fat mayonnaise and salad dressings (reduced sodium). Avocado. Safflower, olive, or canola oils. Natural peanut or almond butter. Other Unsalted popcorn and pretzels. The items listed above may not be a complete list of recommended foods or beverages. Contact your dietitian for more options. WHAT FOODS ARE NOT RECOMMENDED? Grains White bread. White pasta. White rice. Refined cornbread. Bagels and croissants. Crackers that contain trans fat. Vegetables Creamed or fried vegetables. Vegetables in a cheese sauce. Regular canned vegetables. Regular canned tomato sauce and paste. Regular tomato and vegetable juices. Fruits Dried fruits. Canned fruit in light or heavy syrup. Fruit juice. Meat and Other Protein Products Fatty cuts of meat. Ribs, chicken wings, bacon, sausage, bologna, salami, chitterlings, fatback, hot dogs, bratwurst, and packaged luncheon meats. Salted nuts and seeds. Canned beans with salt. Dairy Whole  or 2% milk, cream, half-and-half, and cream cheese. Whole-fat or sweetened yogurt. Full-fat cheeses or blue cheese. Nondairy creamers and whipped toppings. Processed cheese, cheese spreads, or cheese curds. Condiments Onion and garlic salt, seasoned salt, table salt, and sea salt. Canned and packaged gravies. Worcestershire sauce. Tartar sauce. Barbecue sauce. Teriyaki sauce. Soy sauce, including reduced sodium. Steak sauce. Fish sauce. Oyster sauce. Cocktail sauce. Horseradish. Ketchup and mustard. Meat flavorings and tenderizers. Bouillon cubes. Hot sauce. Tabasco sauce. Marinades. Taco seasonings. Relishes. Fats and Oils Butter, stick margarine, lard, shortening, ghee, and bacon fat. Coconut, palm kernel, or palm oils. Regular salad dressings. Other Pickles and olives. Salted popcorn and pretzels. The items listed above may not be a complete list of foods and beverages to avoid. Contact your dietitian for more information. WHERE CAN I FIND MORE INFORMATION? National Heart, Lung, and Blood Institute: travelstabloid.com  This information is not intended to replace advice given to you by your health care provider. Make sure you discuss any questions you have with your health care provider.  Document Released: 07/29/2011 Document Revised: 08/30/2014 Document Reviewed: 06/13/2013 Elsevier Interactive Patient Education 2016 Elsevier Inc. Fat and Cholesterol Restricted Diet High levels of fat and cholesterol in your blood may lead to various health problems, such as diseases of the heart, blood vessels, gallbladder, liver, and pancreas. Fats are concentrated sources of energy that come in various forms. Certain types of fat, including saturated fat, may be harmful in excess. Cholesterol is a substance needed by your body in small amounts. Your body makes all the cholesterol it needs. Excess  cholesterol comes from the food you eat. When you have high levels of  cholesterol and saturated fat in your blood, health problems can develop because the excess fat and cholesterol will gather along the walls of your blood vessels, causing them to narrow. Choosing the right foods will help you control your intake of fat and cholesterol. This will help keep the levels of these substances in your blood within normal limits and reduce your risk of disease. WHAT IS MY PLAN? Your health care provider recommends that you:  Get no more than __________ % of the total calories in your daily diet from fat.  Limit your intake of saturated fat to less than ______% of your total calories each day.  Limit the amount of cholesterol in your diet to less than _________mg per day. WHAT TYPES OF FAT SHOULD I CHOOSE?  Choose healthy fats more often. Choose monounsaturated and polyunsaturated fats, such as olive and canola oil, flaxseeds, walnuts, almonds, and seeds.  Eat more omega-3 fats. Good choices include salmon, mackerel, sardines, tuna, flaxseed oil, and ground flaxseeds. Aim to eat fish at least two times a week.  Limit saturated fats. Saturated fats are primarily found in animal products, such as meats, butter, and cream. Plant sources of saturated fats include palm oil, palm kernel oil, and coconut oil.  Avoid foods with partially hydrogenated oils in them. These contain trans fats. Examples of foods that contain trans fats are stick margarine, some tub margarines, cookies, crackers, and other baked goods. WHAT GENERAL GUIDELINES DO I NEED TO FOLLOW? These guidelines for healthy eating will help you control your intake of fat and cholesterol:  Check food labels carefully to identify foods with trans fats or high amounts of saturated fat.  Fill one half of your plate with vegetables and green salads.  Fill one fourth of your plate with whole grains. Look for the word "whole" as the first word in the ingredient list.  Fill one fourth of your plate with lean protein  foods.  Limit fruit to two servings a day. Choose fruit instead of juice.  Eat more foods that contain soluble fiber. Examples of foods that contain this type of fiber are apples, broccoli, carrots, beans, peas, and barley. Aim to get 20-30 g of fiber per day.  Eat more home-cooked food and less restaurant, buffet, and fast food.  Limit or avoid alcohol.  Limit foods high in starch and sugar.  Limit fried foods.  Cook foods using methods other than frying. Baking, boiling, grilling, and broiling are all great options.  Lose weight if you are overweight. Losing just 5-10% of your initial body weight can help your overall health and prevent diseases such as diabetes and heart disease. WHAT FOODS CAN I EAT? Grains Whole grains, such as whole wheat or whole grain breads, crackers, cereals, and pasta. Unsweetened oatmeal, bulgur, barley, quinoa, or brown rice. Corn or whole wheat flour tortillas. Vegetables Fresh or frozen vegetables (raw, steamed, roasted, or grilled). Green salads. Fruits All fresh, canned (in natural juice), or frozen fruits. Meat and Other Protein Products Ground beef (85% or leaner), grass-fed beef, or beef trimmed of fat. Skinless chicken or [place]. Ground chicken or [place]. Pork trimmed of fat. All fish and seafood. Eggs. Dried beans, peas, or lentils. Unsalted nuts or seeds. Unsalted canned or dry beans. Dairy Low-fat dairy products, such as skim or 1% milk, 2% or reduced-fat cheeses, low-fat ricotta or cottage cheese, or plain low-fat yogurt. Fats and Oils Tub margarines  without trans fats. Light or reduced-fat mayonnaise and salad dressings. Avocado. Olive, canola, sesame, or safflower oils. Natural peanut or almond butter (choose ones without added sugar and oil). The items listed above may not be a complete list of recommended foods or beverages. Contact your dietitian for more options. WHAT FOODS ARE NOT RECOMMENDED? Grains White bread. White pasta. White  rice. Cornbread. Bagels, pastries, and croissants. Crackers that contain trans fat. Vegetables White potatoes. Corn. Creamed or fried vegetables. Vegetables in a cheese sauce. Fruits Dried fruits. Canned fruit in light or heavy syrup. Fruit juice. Meat and Other Protein Products Fatty cuts of meat. Ribs, chicken wings, bacon, sausage, bologna, salami, chitterlings, fatback, hot dogs, bratwurst, and packaged luncheon meats. Liver and organ meats. Dairy Whole or 2% milk, cream, half-and-half, and cream cheese. Whole milk cheeses. Whole-fat or sweetened yogurt. Full-fat cheeses. Nondairy creamers and whipped toppings. Processed cheese, cheese spreads, or cheese curds. Sweets and Desserts Corn syrup, sugars, honey, and molasses. Candy. Jam and jelly. Syrup. Sweetened cereals. Cookies, pies, cakes, donuts, muffins, and ice cream. Fats and Oils Butter, stick margarine, lard, shortening, ghee, or bacon fat. Coconut, palm kernel, or palm oils. Beverages Alcohol. Sweetened drinks (such as sodas, lemonade, and fruit drinks or punches). The items listed above may not be a complete list of foods and beverages to avoid. Contact your dietitian for more information.  This information is not intended to replace advice given to you by your health care provider. Make sure you discuss any questions you have with your health care provider.  Document Released: 08/09/2005 Document Revised: 08/30/2014 Document Reviewed: 11/07/2013 Elsevier Interactive Patient Education 2016 [employer].   Progress Notes - in this encounter Isaias Cowman, MD - 12/04/2015 11:00 AM EDT Formatting of this note may be different from the original. Established Patient Visit   Chief Complaint: Chief Complaint  Patient presents with  . Follow-up  2 weeks  . Shortness of Breath  Exertional  . Edema  Feet and ankles  Date of Service: 12/04/2015 Date of Birth: [birth year] PCP: Arlis Porta, MD  History of Present  Illness: Kristin [name] is a 82 y.o.female patient who returns for  1. Essential hypertension 2. Chronic diastolic congestive heart failure 3. Hyperlipidemia 4. Sinus bradycardia 5. Insignificant Coronary disease by cardiac catheterization 02/22/2002 6. Mitral regurgitation 7. Type 2 diabetes 8. COPD 9. Paroxysmal atrial fibrillation/atrial flutter  Patient was recently hospitalized at [facility] 10/28/2015 with near syncope, at which time the patient was noted to be in atrial ablation/atrial flutter, started on Eliquis. 2D echocardiogram on 10/29/2015 revealed normal left ventricular function, with LV ejection fraction of 55-65%, with moderate mitral regurgitation. 24-hour Holter monitor was performed 11/27/2015 which revealed predominant sinus bradycardia, with mean heart rate of 67 bpm, with intermittent episodes of sinus arrhythmia, junctional rhythm, and frequent brief runs of probable atrial flutter. The patient received injections at the pain clinic on 12/02/2015 at which time she was also noted to be in atrial flutter.  The patient has essential hypertension, blood pressure is well controlled on lisinopril and furosemide, which are well tolerated without apparent side effects. The patient follows a low-sodium, no added salt diet.  Past Medical and Surgical History  Past Medical History Past Medical History  Diagnosis Date  . Adenomatous polyps  . Anemia, unspecified  . Breast cancer (CMS-HCC)  . CHF (congestive heart failure) (CMS-HCC)  . Chronic arthritis  . Chronic renal insufficiency  . COPD (chronic obstructive pulmonary disease) , unspecified (CMS-HCC)  .  Coronary artery disease  . Diabetes mellitus type 2, uncomplicated (CMS-HCC)  . Endometriosis  . Gout  . Heart disease  . Hepatitis  . Hyperlipidemia, unspecified  . Hypertension  . Sinus bradycardia  . Tricuspid regurgitation   Past Surgical History She has a past surgical history that includes shoulder surgery; Bilateral knee  replacements; Left shoulder replacement; Colonoscopy (10/17/2006); egd (01/31/2012); Hysterectomy; and Stomach surgery.   Medications and Allergies  Current Medications  Current Outpatient Prescriptions  Medication Sig Dispense Refill  . albuterol (PROVENTIL) 2.5 mg /3 mL (0.083 %) nebulizer solution Take 2.5 mg by nebulization every 8 (eight) hours as needed for Wheezing.  Marland Kitchen apixaban (ELIQUIS) 5 mg tablet Take 1 tablet (5 mg total) by mouth every 12 (twelve) hours. 60 tablet 5  . bisacodyl (DULCOLAX) 5 mg EC tablet Take by mouth.  . calcium citrate-vitamin D3 (CITRACAL+D) 315-200 mg-unit tablet Take by mouth.  . FUROsemide (LASIX) 20 MG tablet Take 20 mg by mouth once daily.  Marland Kitchen levocetirizine (XYZAL) 5 MG tablet Take 5 mg by mouth once daily. 2  . lisinopril (PRINIVIL,ZESTRIL) 10 MG tablet Take 10 mg by mouth once daily.  . magnesium oxide (MAG-OX) 400 mg tablet Take 400 mg by mouth once daily.  Marland Kitchen omeprazole (PRILOSEC) 20 MG DR capsule Take 20 mg by mouth once daily.  Marland Kitchen oxyCODONE (ROXICODONE) 10 mg tablet Take by mouth.  Marland Kitchen rOPINIRole (REQUIP) 0.25 MG tablet Take 0.25 mg by mouth once daily as needed.   No current facility-administered medications for this visit.   Allergies: Flexeril [cyclobenzaprine] and Vancomycin  Social and Family History  Social History reports that she quit smoking about 12 years ago. Her smoking use included Cigarettes. She does not have any smokeless tobacco history on file. She reports that she does not drink alcohol or use illicit drugs.  Family History Family History  Problem Relation Age of Onset  . Hypertension Mother  . Stroke Mother  . Heart attack Father  . Heart disease Brother  . Heart attack Brother  . Heart attack Brother   Review of Systems   Review of Systems: The patient denies chest pain, reports chronic exertional shortness of breath, without orthopnea, paroxysmal nocturnal dyspnea, with mild pedal edema, without palpitations, heart  racing, presyncope, syncope. Review of 12 Systems is negative except as described above.  Physical Examination   Vitals: Visit Vitals  . BP 110/70  . Pulse (!) 42  . Ht 175.3 cm ('5\' 9"'$ )  . Wt 80.7 kg (178 lb)  . BMI 26.29 kg/m2   Ht:175.3 cm ('5\' 9"'$ ) Wt:80.7 kg (178 lb) KDT:OIZT surface area is 1.98 meters squared. Body mass index is 26.29 kg/(m^2).  HEENT: Pupils equally reactive to light and accomodation  Neck: Supple without thyromegaly, carotid pulses 2+ Lungs: clear to auscultation bilaterally; no wheezes, rales, rhonchi Heart: Regular rate and rhythm. No gallops, murmurs or rub Abdomen: soft nontender, nondistended, with normal bowel sounds Extremities: no cyanosis, clubbing, or edema Peripheral Pulses: 2+ in all extremities, 2+ femoral pulses bilaterally  Assessment   82 y.o. female with  1. Chronic diastolic CHF (congestive heart failure) (CMS-HCC)  2. Essential hypertension, benign  3. New onset atrial flutter (CMS-HCC)  4. Sinoatrial node dysfunction (CMS-HCC)  5. Simple chronic bronchitis (CMS-HCC)  6. S/P cardiac catheterization   82 year old female with essential hypertension, blood pressure under good control. Patient has chronic diastolic congestive heart failure, peripheral edema is much better control controlled after limiting her sodium intake. Patient was recently  hospitalized with near syncope, noted to be in atrial fibrillation atrial flutter, started on Eliquis. Holter monitor revealed predominant sinus bradycardia with intermittent sinus arrhythmia and junctional rhythm, with frequent brief runs of atrial flutter. We discussed in detail the potential benefits of permanent pacemaker implantation, and more aggressive therapy of atrial flutter with antiarrhythmics.  Plan   1. Continue current medications 2. Counseled patient about low-sodium diet 3. DASH diet printed instructions given to the patient 4. Counseled patient about low-cholesterol diet 5.  Low-fat and cholesterol diet printed instructions given to the patient 6. Continue Eliquis for stroke prevention 7. Return to clinic for follow-up in 2 weeks at which time we will make final decision about proceeding with permanent pacemaker implantation.  No orders of the defined types were placed in this encounter.  Return in about 2 weeks (around 12/18/2015).  Isaias Cowman, MD    Plan of Treatment - as of this encounter Upcoming Encounters Upcoming Encounters  Date Type Specialty Care Team Description  01/07/2016 Office Visit Cardiology Isaias Cowman, [place]  [facility]  [place], [place] [address]  [phone]  [phone] (Fax)    Imaging Results - in this encounter   [place] (12/16/2015 9:23 AM) EXTERNAL RADIOLOGY RESULT - CHEST (12/16/2015 9:23 AM)  Narrative  Ordered by an unspecified provider.   EKG Results - in this encounter   ECG (12/04/2015) ECG (12/04/2015)  Narrative  Ordered by an unspecified provider.   Visit Diagnoses - in this encounter Diagnosis  Chronic diastolic CHF (congestive heart failure) (CMS-HCC) - Primary  Essential hypertension, benign  New onset atrial flutter (CMS-HCC)  Sinoatrial node dysfunction (CMS-HCC)  Sinoatrial node dysfunction   Simple chronic bronchitis (CMS-HCC)  Simple chronic bronchitis   S/P cardiac catheterization  Other postprocedural status   Document Information Service Providers Document Coverage Dates Apr. 13, 2017 - Apr. 13, 2017 [facility] [phone] (Work) [place], [place] [address] Encounter Providers Isaias Cowman MD (Attending) [phone] (Work) [phone] (Fax)  [address] [facility] [place], [place] [address] Encounter Date Apr. 13, 2017 - Apr. 13, 2017

## 2015-12-24 NOTE — Anesthesia Preprocedure Evaluation (Addendum)
Anesthesia Evaluation  Patient identified by MRN, date of birth, ID band Patient awake    Reviewed: Allergy & Precautions, NPO status , Patient's Chart, lab work & pertinent test results, reviewed documented beta blocker date and time   Airway Mallampati: II  TM Distance: >3 FB     Dental  (+) Chipped   Pulmonary COPD, former smoker,           Cardiovascular hypertension, Pt. on medications +CHF  + dysrhythmias Atrial Fibrillation      Neuro/Psych  Neuromuscular disease    GI/Hepatic GERD  ,(+) Cirrhosis       , Hepatitis -, C  Endo/Other    Renal/GU Renal InsufficiencyRenal disease     Musculoskeletal  (+) Arthritis ,   Abdominal   Peds  Hematology   Anesthesia Other Findings Gout. RLS. Chronic pain pt, on opiods. Hx of CHF. A fib on eliquis. 2L o2 at nite only. EKG reviewed, Rbbb,L ant hemiblock, T wave inv in lat leads, She is now in SR. Cardiologist aware.  Reproductive/Obstetrics                            Anesthesia Physical Anesthesia Plan  ASA: III  Anesthesia Plan: MAC   Post-op Pain Management:    Induction:   Airway Management Planned:   Additional Equipment:   Intra-op Plan:   Post-operative Plan:   Informed Consent: I have reviewed the patients History and Physical, chart, labs and discussed the procedure including the risks, benefits and alternatives for the proposed anesthesia with the patient or authorized representative who has indicated his/her understanding and acceptance.     Plan Discussed with: CRNA  Anesthesia Plan Comments:         Anesthesia Quick Evaluation

## 2015-12-25 DIAGNOSIS — I480 Paroxysmal atrial fibrillation: Secondary | ICD-10-CM | POA: Diagnosis not present

## 2015-12-25 DIAGNOSIS — E785 Hyperlipidemia, unspecified: Secondary | ICD-10-CM | POA: Diagnosis not present

## 2015-12-25 DIAGNOSIS — I495 Sick sinus syndrome: Secondary | ICD-10-CM | POA: Diagnosis not present

## 2015-12-25 DIAGNOSIS — R001 Bradycardia, unspecified: Secondary | ICD-10-CM | POA: Diagnosis not present

## 2015-12-25 DIAGNOSIS — Z79891 Long term (current) use of opiate analgesic: Secondary | ICD-10-CM | POA: Diagnosis not present

## 2015-12-25 DIAGNOSIS — Z96653 Presence of artificial knee joint, bilateral: Secondary | ICD-10-CM | POA: Diagnosis not present

## 2015-12-25 DIAGNOSIS — I2510 Atherosclerotic heart disease of native coronary artery without angina pectoris: Secondary | ICD-10-CM | POA: Diagnosis not present

## 2015-12-25 DIAGNOSIS — N189 Chronic kidney disease, unspecified: Secondary | ICD-10-CM | POA: Diagnosis not present

## 2015-12-25 DIAGNOSIS — Z96612 Presence of left artificial shoulder joint: Secondary | ICD-10-CM | POA: Diagnosis not present

## 2015-12-25 DIAGNOSIS — Z87891 Personal history of nicotine dependence: Secondary | ICD-10-CM | POA: Diagnosis not present

## 2015-12-25 DIAGNOSIS — Z853 Personal history of malignant neoplasm of breast: Secondary | ICD-10-CM | POA: Diagnosis not present

## 2015-12-25 DIAGNOSIS — Z7901 Long term (current) use of anticoagulants: Secondary | ICD-10-CM | POA: Diagnosis not present

## 2015-12-25 DIAGNOSIS — I129 Hypertensive chronic kidney disease with stage 1 through stage 4 chronic kidney disease, or unspecified chronic kidney disease: Secondary | ICD-10-CM | POA: Diagnosis not present

## 2015-12-25 DIAGNOSIS — M109 Gout, unspecified: Secondary | ICD-10-CM | POA: Diagnosis not present

## 2015-12-25 DIAGNOSIS — E1122 Type 2 diabetes mellitus with diabetic chronic kidney disease: Secondary | ICD-10-CM | POA: Diagnosis not present

## 2015-12-25 DIAGNOSIS — M1990 Unspecified osteoarthritis, unspecified site: Secondary | ICD-10-CM | POA: Diagnosis not present

## 2015-12-25 DIAGNOSIS — Z79899 Other long term (current) drug therapy: Secondary | ICD-10-CM | POA: Diagnosis not present

## 2015-12-25 DIAGNOSIS — J449 Chronic obstructive pulmonary disease, unspecified: Secondary | ICD-10-CM | POA: Diagnosis not present

## 2015-12-25 DIAGNOSIS — I4892 Unspecified atrial flutter: Secondary | ICD-10-CM | POA: Diagnosis not present

## 2015-12-25 DIAGNOSIS — I340 Nonrheumatic mitral (valve) insufficiency: Secondary | ICD-10-CM | POA: Diagnosis not present

## 2015-12-25 DIAGNOSIS — I5032 Chronic diastolic (congestive) heart failure: Secondary | ICD-10-CM | POA: Diagnosis not present

## 2015-12-25 DIAGNOSIS — Z86010 Personal history of colonic polyps: Secondary | ICD-10-CM | POA: Diagnosis not present

## 2015-12-25 MED ORDER — CEPHALEXIN 250 MG PO CAPS: 250.0000 mg | ORAL_CAPSULE | Freq: Four times a day (QID) | ORAL | Status: DC

## 2015-12-25 NOTE — Discharge Instructions (Signed)
Resume Eliquis 12/26/2015. Do not lift left arm above head.

## 2015-12-25 NOTE — Progress Notes (Signed)
Discharge instructions explained to pt/ verbalized an understanding/ iv and telel removed/ rx given to pt/ pt request to take her morning meds at home after discharge/ will transport off unit via wheelchair when ride arrives

## 2015-12-30 ENCOUNTER — Other Ambulatory Visit: Payer: Self-pay

## 2015-12-30 DIAGNOSIS — G2581 Restless legs syndrome: Secondary | ICD-10-CM

## 2015-12-30 MED ORDER — ROPINIROLE HCL 2 MG PO TABS: 2.0000 mg | ORAL_TABLET | Freq: Every day | ORAL | Status: DC

## 2016-01-07 DIAGNOSIS — I5032 Chronic diastolic (congestive) heart failure: Secondary | ICD-10-CM | POA: Diagnosis not present

## 2016-01-07 DIAGNOSIS — J410 Simple chronic bronchitis: Secondary | ICD-10-CM | POA: Diagnosis not present

## 2016-01-07 DIAGNOSIS — Z950 Presence of cardiac pacemaker: Secondary | ICD-10-CM | POA: Insufficient documentation

## 2016-01-07 DIAGNOSIS — N183 Chronic kidney disease, stage 3 (moderate): Secondary | ICD-10-CM | POA: Diagnosis not present

## 2016-01-07 DIAGNOSIS — I10 Essential (primary) hypertension: Secondary | ICD-10-CM | POA: Diagnosis not present

## 2016-01-07 DIAGNOSIS — I495 Sick sinus syndrome: Secondary | ICD-10-CM | POA: Diagnosis not present

## 2016-01-09 DIAGNOSIS — J45998 Other asthma: Secondary | ICD-10-CM | POA: Diagnosis not present

## 2016-02-09 DIAGNOSIS — J45998 Other asthma: Secondary | ICD-10-CM | POA: Diagnosis not present

## 2016-02-11 ENCOUNTER — Encounter: Payer: Medicare Other | Admitting: Pain Medicine

## 2016-02-16 ENCOUNTER — Other Ambulatory Visit: Payer: Self-pay | Admitting: Pain Medicine

## 2016-02-16 NOTE — Telephone Encounter (Signed)
Note: It is the responsibility of all patients to have an up to date list of all the medications that need to be refilled at the time of their appointment.  Pain Management Policy: Medication changes and refills will be conducted during appointments only, after determining if the medical indications for the use of the medication are still present. No fax, phone, or electronic refills will be conducted outside of an evaluation appointment. Patients are encouraged to call and set up an appointment to comply with stipulated policy. 

## 2016-02-19 ENCOUNTER — Encounter: Payer: Self-pay | Admitting: Family Medicine

## 2016-02-19 ENCOUNTER — Ambulatory Visit (INDEPENDENT_AMBULATORY_CARE_PROVIDER_SITE_OTHER): Payer: Medicare Other | Admitting: Family Medicine

## 2016-02-19 VITALS — BP 128/80 | HR 90 | Temp 97.6°F | Resp 16 | Ht 69.0 in | Wt 170.0 lb

## 2016-02-19 DIAGNOSIS — J431 Panlobular emphysema: Secondary | ICD-10-CM | POA: Diagnosis not present

## 2016-02-19 DIAGNOSIS — I10 Essential (primary) hypertension: Secondary | ICD-10-CM | POA: Diagnosis not present

## 2016-02-19 DIAGNOSIS — K219 Gastro-esophageal reflux disease without esophagitis: Secondary | ICD-10-CM | POA: Diagnosis not present

## 2016-02-19 DIAGNOSIS — R600 Localized edema: Secondary | ICD-10-CM

## 2016-02-19 DIAGNOSIS — I483 Typical atrial flutter: Secondary | ICD-10-CM | POA: Diagnosis not present

## 2016-02-19 DIAGNOSIS — M5137 Other intervertebral disc degeneration, lumbosacral region: Secondary | ICD-10-CM | POA: Diagnosis not present

## 2016-02-19 MED ORDER — LISINOPRIL 10 MG PO TABS: 10.0000 mg | ORAL_TABLET | Freq: Every day | ORAL | Status: DC

## 2016-02-19 MED ORDER — FUROSEMIDE 20 MG PO TABS: 20.0000 mg | ORAL_TABLET | Freq: Every day | ORAL | Status: DC

## 2016-02-19 MED ORDER — OMEPRAZOLE 20 MG PO CPDR: 20.0000 mg | DELAYED_RELEASE_CAPSULE | Freq: Every day | ORAL | Status: DC

## 2016-02-19 MED ORDER — ALBUTEROL SULFATE (2.5 MG/3ML) 0.083% IN NEBU: 2.5000 mg | INHALATION_SOLUTION | Freq: Three times a day (TID) | RESPIRATORY_TRACT | Status: DC | PRN

## 2016-02-19 NOTE — Progress Notes (Signed)
Name: Kristin [name]   MRN: [id number]    DOB: August 11, 1930   Date:02/19/2016       Progress Note  Subjective  Chief Complaint  Chief Complaint  Patient presents with  . Hypertension    HPI Here for f/u of HBP.  Also has Atrial flutter and has pacer and is on Eliquis.  Also with GERD (takes Omeprazole daily)  and COPD with rare use of Albuterol.  Sees Pain management for back and leg pain.  No problem-specific assessment & plan notes found for this encounter.   Past Medical History  Diagnosis Date  . Hypertension   . Hyperlipidemia   . COPD (chronic obstructive pulmonary disease) (HCC)     wears O2 at 2L via  at night  . Arthritis   . Chronic back pain   . CHF (congestive heart failure) ([place])   . Hepatitis C   . Gout   . Arthritis   . OAB (overactive bladder)   . UTI (lower urinary tract infection)   . Cystocele   . Vaginal atrophy   . Bradycardia   . Hematuria   . Urinary frequency   . Hypomagnesemia 06/25/2015  . Cancer [name]) 2004    rt breast cancer-post lumpectomy- chemo/rad  . Back pain     lower back chronic  . CKD (chronic kidney disease)   . Overactive bladder   . Atrial fibrillation ([place])   . Decubitus ulcers   . Restless leg     Past Surgical History  Procedure Laterality Date  . Abdominal hysterectomy    . Back surgery    . Rotator cuff repair      bilateral  . Breast surgery      rt lumpectomy  . Cataract extraction w/phaco  10/19/2011    Procedure: CATARACT EXTRACTION PHACO AND INTRAOCULAR LENS PLACEMENT (IOC);  Surgeon: Elta Guadeloupe T. Gershon Crane, MD;  Location: [facility];  Service: Ophthalmology;  Laterality: Right;  CDE:10.81  . Cataract extraction w/phaco  11/02/2011    Procedure: CATARACT EXTRACTION PHACO AND INTRAOCULAR LENS PLACEMENT (IOC);  Surgeon: Elta Guadeloupe T. Gershon Crane, MD;  Location: [facility];  Service: Ophthalmology;  Laterality: Left;  CDE 8.60  . Carpal tunnel release    . Joint replacement      bilateral TKA  . Colon surgery    . Pacemaker insertion N/A  12/24/2015    Procedure: INSERTION PACEMAKER;  Surgeon: Isaias Cowman, MD;  Location: [facility] ORS;  Service: Cardiovascular;  Laterality: N/A;    Family History  Problem Relation Age of Onset  . Anesthesia problems Neg Hx   . Hypotension Neg Hx   . Malignant hyperthermia Neg Hx   . Pseudochol deficiency Neg Hx   . Heart disease Father   . Heart disease Mother   . Stroke Mother   . Cancer Sister   . Breast cancer Sister 59  . Breast cancer Paternal Aunt     Social History   Social History  . Marital Status: Single    Spouse Name: N/A  . Number of Children: N/A  . Years of Education: N/A   Occupational History  . retired    Social History Main Topics  . Smoking status: Former Smoker -- 1.25 packs/day for 40 years    Types: Cigarettes    Quit date: 12/16/1998  . Smokeless tobacco: Not on file  . Alcohol Use: No  . Drug Use: No  . Sexual Activity: Yes    Birth Control/ Protection: Surgical   Other Topics  Concern  . Not on file   Social History Narrative     Current outpatient prescriptions:  .  albuterol (PROVENTIL) (2.5 MG/3ML) 0.083% nebulizer solution, Take 3 mLs (2.5 mg total) by nebulization every 8 (eight) hours as needed. Shortness of breath and wheezing, Disp: 75 mL, Rfl: 12 .  apixaban (ELIQUIS) 5 MG TABS tablet, Take 1 tablet (5 mg total) by mouth 2 (two) times daily., Disp: 60 tablet, Rfl: 0 .  bisacodyl (DULCOLAX) 5 MG EC tablet, Take 2 tablets (10 mg total) by mouth daily as needed for moderate constipation., Disp: 60 tablet, Rfl: 2 .  calcium citrate-vitamin D (CITRACAL+D) 315-200 MG-UNIT per tablet, Take 1 tablet by mouth 2 (two) times daily., Disp: , Rfl:  .  Cholecalciferol (VITAMIN D3) 2000 units TABS, Take 1 tablet by mouth daily., Disp: 30 tablet, Rfl: 2 .  furosemide (LASIX) 20 MG tablet, Take 1 tablet (20 mg total) by mouth daily., Disp: 90 tablet, Rfl: 3 .  levocetirizine (XYZAL) 5 MG tablet, Take 1 tablet (5 mg total) by mouth every evening.,  Disp: 30 tablet, Rfl: 12 .  lisinopril (PRINIVIL,ZESTRIL) 10 MG tablet, Take 1 tablet (10 mg total) by mouth daily., Disp: 90 tablet, Rfl: 3 .  magnesium oxide (MAG-OX) 400 MG tablet, Take 1 tablet (400 mg total) by mouth daily., Disp: 30 tablet, Rfl: 2 .  omeprazole (PRILOSEC) 20 MG capsule, Take 1 capsule (20 mg total) by mouth daily., Disp: 90 capsule, Rfl: 3 .  Oxycodone HCl 20 MG TABS, Take 1 tablet (20 mg total) by mouth every 8 (eight) hours as needed., Disp: 90 tablet, Rfl: 0 .  OXYGEN, Inhale 2 Doses into the lungs at bedtime., Disp: , Rfl:  .  rOPINIRole (REQUIP) 2 MG tablet, Take 1 tablet (2 mg total) by mouth at bedtime., Disp: 30 tablet, Rfl: 2 .  Wheat Dextrin (BENEFIBER) POWD, Stir 2 teaspoons of Benefiber into 4-8 oz of any non-carbonated beverage or soft food (hot or cold) PO TID, Disp: 500 g, Rfl: 2  Allergies  Allergen Reactions  . Flexeril [Cyclobenzaprine Hcl]     Severe Rash  . Vancomycin Rash    [place] Syndrome     Review of Systems  Constitutional: Negative for fever, chills, weight loss and malaise/fatigue.  HENT: Negative for hearing loss.   Eyes: Negative for blurred vision and double vision.  Respiratory: Negative for cough, shortness of breath and wheezing.   Cardiovascular: Negative for chest pain, palpitations and leg swelling.  Gastrointestinal: Negative for heartburn, abdominal pain and blood in stool.  Genitourinary: Negative for dysuria, urgency and frequency.  Skin: Negative for rash.  Neurological: Negative for dizziness, tremors, weakness and headaches.      Objective  Filed Vitals:   02/19/16 1054  BP: 128/80  Pulse: 90  Temp: 97.6 F (36.4 C)  TempSrc: Oral  Resp: 16  Height: _0  (1.753 m)  Weight: 170 lb (77.111 kg)    Physical Exam  Constitutional: She is oriented to person, place, and time and well-developed, well-nourished, and in no distress. No distress.  HENT:  Head: Normocephalic and atraumatic.  Eyes: Conjunctivae  and EOM are normal. Pupils are equal, round, and reactive to light. No scleral icterus.  Neck: Normal range of motion. Neck supple. Carotid bruit is not present. No thyromegaly present.  Cardiovascular: Normal rate, regular rhythm and normal heart sounds.  Exam reveals no gallop.   No murmur heard. Pulmonary/Chest: Effort normal and breath sounds normal. No respiratory distress.  She has no wheezes. She has no rales.  Abdominal: Soft. Bowel sounds are normal.  Musculoskeletal: She exhibits no edema.  Lymphadenopathy:    She has no cervical adenopathy.  Neurological: She is alert and oriented to person, place, and time.  Vitals reviewed.      Recent Results (from the past 2160 hour(s))  CULTURE, URINE COMPREHENSIVE     Status: None   Collection Time: 12/16/15 12:00 AM  Result Value Ref Range   Urine Culture, Comprehensive Final report    Result 1 Comment     Comment: More than 3 organisms recovered, none predominant. Please submit another culture if clinically indicated. 50,000-100,000 colony forming units per mL   CBC with Differential/Platelet     Status: Abnormal   Collection Time: 12/16/15  9:58 AM  Result Value Ref Range   WBC 6.8 3.6 - 11.0 K/uL   RBC 4.01 3.80 - 5.20 MIL/uL   Hemoglobin 12.1 12.0 - 16.0 g/dL   HCT 36.7 35.0 - 47.0 %   MCV 91.4 80.0 - 100.0 fL   MCH 30.3 26.0 - 34.0 pg   MCHC 33.1 32.0 - 36.0 g/dL   RDW 14.8 (H) 11.5 - 14.5 %   Platelets 156 150 - 440 K/uL   Neutrophils Relative % 57 %   Neutro Abs 3.9 1.4 - 6.5 K/uL   Lymphocytes Relative 25 %   Lymphs Abs 1.7 1.0 - 3.6 K/uL   Monocytes Relative 12 %   Monocytes Absolute 0.8 0.2 - 0.9 K/uL   Eosinophils Relative 5 %   Eosinophils Absolute 0.3 0 - 0.7 K/uL   Basophils Relative 1 %   Basophils Absolute 0.0 0 - 0.1 K/uL  Basic metabolic panel     Status: Abnormal   Collection Time: 12/16/15  9:58 AM  Result Value Ref Range   Sodium 138 135 - 145 mmol/L   Potassium 4.3 3.5 - 5.1 mmol/L   Chloride  107 101 - 111 mmol/L   CO2 23 22 - 32 mmol/L   Glucose, Bld 88 65 - 99 mg/dL   BUN 22 (H) 6 - 20 mg/dL   Creatinine, Ser 1.26 (H) 0.44 - 1.00 mg/dL   Calcium 9.4 8.9 - 10.3 mg/dL   GFR calc non Af Amer 38 (L) >60 mL/min   GFR calc Af Amer 44 (L) >60 mL/min    Comment: (NOTE) The eGFR has been calculated using the CKD EPI equation. This calculation has not been validated in all clinical situations. eGFR's persistently <60 mL/min signify possible Chronic Kidney Disease.    Anion gap 8 5 - 15  Protime-INR     Status: Abnormal   Collection Time: 12/16/15  9:58 AM  Result Value Ref Range   Prothrombin Time 15.8 (H) 11.4 - 15.0 seconds   INR 1.24   APTT     Status: None   Collection Time: 12/16/15  9:58 AM  Result Value Ref Range   aPTT 26 24 - 36 seconds  Surgical pcr screen     Status: None   Collection Time: 12/16/15  9:58 AM  Result Value Ref Range   MRSA, PCR NEGATIVE NEGATIVE   Staphylococcus aureus NEGATIVE NEGATIVE    Comment:        The Xpert SA Assay (FDA approved for NASAL specimens in patients over 26 years of age), is one component of a comprehensive surveillance program.  Test performance has been validated by [facility] for patients greater than or equal to 1 year  old. It is not intended to diagnose infection nor to guide or monitor treatment.   ToxASSURE Select 13 (MW), Urine     Status: None   Collection Time: 12/18/15  2:31 PM  Result Value Ref Range   ToxAssure Select 13 FINAL     Comment: ==================================================================== TOXASSURE SELECT 13 (MW) ==================================================================== Test                             Result       Flag       Units Drug Present and Declared for Prescription Verification   Oxycodone                      2377         EXPECTED   ng/mg creat   Oxymorphone                    2225         EXPECTED   ng/mg creat   Noroxycodone                   7596          EXPECTED   ng/mg creat   Noroxymorphone                 911          EXPECTED   ng/mg creat    Sources of oxycodone are scheduled prescription medications.    Oxymorphone, noroxycodone, and noroxymorphone are expected    metabolites of oxycodone. Oxymorphone is also available as a    scheduled prescription medication. ==================================================================== Test                      Result    Flag   Units      Ref Range   Creatinine              103              mg/dL      >=20 ====== ============================================================== Declared Medications:  The flagging and interpretation on this report are based on the  following declared medications.  Unexpected results may arise from  inaccuracies in the declared medications.  **Note: The testing scope of this panel includes these medications:  Oxycodone  **Note: The testing scope of this panel does not include following  reported medications:  Albuterol  Apixaban  Aspirin  Bisacodyl  Calcium citrate (Calcium citrate/Vitamin D)  Cholecalciferol  Furosemide  Levocetirizine  Lisinopril  Magnesium Oxide  Omeprazole  Oxygen  Ropinirole  Supplement  Vitamin D (Calcium citrate/Vitamin D) ==================================================================== For clinical consultation, please call [phone]. ====================================================================      Assessment & Plan  Problem List Items Addressed This Visit      Cardiovascular and Mediastinum   Essential (primary) hypertension - Primary   Relevant Medications   lisinopril (PRINIVIL,ZESTRIL) 10 MG tablet   furosemide (LASIX) 20 MG tablet   Other Relevant Orders   COMPLETE METABOLIC PANEL WITH GFR   Lipid Profile   Atrial flutter (HCC)   Relevant Medications   lisinopril (PRINIVIL,ZESTRIL) 10 MG tablet   furosemide (LASIX) 20 MG tablet     Respiratory   Chronic obstructive pulmonary disease  (HCC)   Relevant Medications   albuterol (PROVENTIL) (2.5 MG/3ML) 0.083% nebulizer solution     Digestive   GERD (gastroesophageal reflux disease) (Chronic)   Relevant  Medications   omeprazole (PRILOSEC) 20 MG capsule   Other Relevant Orders   CBC with Differential     Musculoskeletal and Integument   DDD (degenerative disc disease), lumbosacral (Chronic)    Other Visit Diagnoses    Pedal edema        Relevant Medications    furosemide (LASIX) 20 MG tablet       Meds ordered this encounter  Medications  . albuterol (PROVENTIL) (2.5 MG/3ML) 0.083% nebulizer solution    Sig: Take 3 mLs (2.5 mg total) by nebulization every 8 (eight) hours as needed. Shortness of breath and wheezing    Dispense:  75 mL    Refill:  12  . omeprazole (PRILOSEC) 20 MG capsule    Sig: Take 1 capsule (20 mg total) by mouth daily.    Dispense:  90 capsule    Refill:  3  . lisinopril (PRINIVIL,ZESTRIL) 10 MG tablet    Sig: Take 1 tablet (10 mg total) by mouth daily.    Dispense:  90 tablet    Refill:  3    PT IS REQUESTING A 90 DAYS SUPPLY  . furosemide (LASIX) 20 MG tablet    Sig: Take 1 tablet (20 mg total) by mouth daily.    Dispense:  90 tablet    Refill:  3   1. Essential (primary) hypertension  - COMPLETE METABOLIC PANEL WITH GFR - Lipid Profile - lisinopril (PRINIVIL,ZESTRIL) 10 MG tablet; Take 1 tablet (10 mg total) by mouth daily.  Dispense: 90 tablet; Refill: 3  2. Typical atrial flutter (HCC) Cont to see Dr. Josefa Half  3. Gastroesophageal reflux disease without esophagitis  - CBC with Differential - omeprazole (PRILOSEC) 20 MG capsule; Take 1 capsule (20 mg total) by mouth daily.  Dispense: 90 capsule; Refill: 3  4. Panlobular emphysema (HCC)  - albuterol (PROVENTIL) (2.5 MG/3ML) 0.083% nebulizer solution; Take 3 mLs (2.5 mg total) by nebulization every 8 (eight) hours as needed. Shortness of breath and wheezing  Dispense: 75 mL; Refill: 12  5. DDD (degenerative disc disease),  lumbosacral Cont top go to pain clinic  6. Pedal edema  - furosemide (LASIX) 20 MG tablet; Take 1 tablet (20 mg total) by mouth daily.  Dispense: 90 tablet; Refill: 3

## 2016-02-25 ENCOUNTER — Encounter: Payer: Self-pay | Admitting: Urology

## 2016-02-25 ENCOUNTER — Ambulatory Visit: Payer: Medicare Other | Admitting: Urology

## 2016-03-01 DIAGNOSIS — N183 Chronic kidney disease, stage 3 (moderate): Secondary | ICD-10-CM | POA: Diagnosis not present

## 2016-03-02 ENCOUNTER — Other Ambulatory Visit: Payer: Medicare Other

## 2016-03-02 DIAGNOSIS — I10 Essential (primary) hypertension: Secondary | ICD-10-CM | POA: Diagnosis not present

## 2016-03-02 DIAGNOSIS — K219 Gastro-esophageal reflux disease without esophagitis: Secondary | ICD-10-CM | POA: Diagnosis not present

## 2016-03-03 ENCOUNTER — Ambulatory Visit: Payer: Medicare Other | Admitting: Urology

## 2016-03-03 LAB — CBC WITH DIFFERENTIAL/PLATELET
BASOS ABS: 59 {cells}/uL (ref 0–200)
Basophils Relative: 1 %
EOS PCT: 8 %
Eosinophils Absolute: 472 cells/uL (ref 15–500)
HCT: 41.6 % (ref 35.0–45.0)
HEMOGLOBIN: 13.5 g/dL (ref 11.7–15.5)
LYMPHS ABS: 2360 {cells}/uL (ref 850–3900)
Lymphocytes Relative: 40 %
MCH: 29.5 pg (ref 27.0–33.0)
MCHC: 32.5 g/dL (ref 32.0–36.0)
MCV: 90.8 fL (ref 80.0–100.0)
MONOS PCT: 8 %
MPV: 10.4 fL (ref 7.5–12.5)
Monocytes Absolute: 472 cells/uL (ref 200–950)
NEUTROS ABS: 2537 {cells}/uL (ref 1500–7800)
Neutrophils Relative %: 43 %
PLATELETS: 191 10*3/uL (ref 140–400)
RBC: 4.58 MIL/uL (ref 3.80–5.10)
RDW: 13.7 % (ref 11.0–15.0)
WBC: 5.9 10*3/uL (ref 3.8–10.8)

## 2016-03-03 LAB — COMPLETE METABOLIC PANEL WITH GFR
ALT: 10 U/L (ref 6–29)
AST: 21 U/L (ref 10–35)
Albumin: 3.7 g/dL (ref 3.6–5.1)
Alkaline Phosphatase: 74 U/L (ref 33–130)
BILIRUBIN TOTAL: 0.5 mg/dL (ref 0.2–1.2)
BUN: 21 mg/dL (ref 7–25)
CHLORIDE: 105 mmol/L (ref 98–110)
CO2: 27 mmol/L (ref 20–31)
CREATININE: 1.16 mg/dL — AB (ref 0.60–0.88)
Calcium: 10 mg/dL (ref 8.6–10.4)
GFR, Est African American: 50 mL/min — ABNORMAL LOW (ref >=60)
GFR, Est Non African American: 43 mL/min — ABNORMAL LOW (ref >=60)
GLUCOSE: 81 mg/dL (ref 65–99)
Potassium: 4.6 mmol/L (ref 3.5–5.3)
SODIUM: 143 mmol/L (ref 135–146)
TOTAL PROTEIN: 6.7 g/dL (ref 6.1–8.1)

## 2016-03-03 LAB — LIPID PANEL
Cholesterol: 204 mg/dL — ABNORMAL HIGH (ref 125–200)
HDL: 58 mg/dL (ref >=46)
LDL CALC: 131 mg/dL — AB (ref <130)
TRIGLYCERIDES: 76 mg/dL (ref <150)
Total CHOL/HDL Ratio: 3.5 Ratio (ref <=5.0)
VLDL: 15 mg/dL (ref <30)

## 2016-03-08 DIAGNOSIS — R350 Frequency of micturition: Secondary | ICD-10-CM | POA: Diagnosis not present

## 2016-03-08 DIAGNOSIS — R3915 Urgency of urination: Secondary | ICD-10-CM | POA: Diagnosis not present

## 2016-03-10 DIAGNOSIS — J45998 Other asthma: Secondary | ICD-10-CM | POA: Diagnosis not present

## 2016-03-17 ENCOUNTER — Encounter: Payer: Self-pay | Admitting: Urology

## 2016-03-17 ENCOUNTER — Encounter: Payer: Self-pay | Admitting: Pain Medicine

## 2016-03-17 ENCOUNTER — Ambulatory Visit: Payer: Medicare Other | Attending: Pain Medicine | Admitting: Pain Medicine

## 2016-03-17 ENCOUNTER — Ambulatory Visit (INDEPENDENT_AMBULATORY_CARE_PROVIDER_SITE_OTHER): Payer: Medicare Other | Admitting: Urology

## 2016-03-17 VITALS — BP 101/70 | HR 85 | Ht 67.0 in | Wt 172.0 lb

## 2016-03-17 VITALS — BP 140/80 | HR 87 | Temp 98.7°F | Resp 18 | Ht 69.0 in | Wt 170.0 lb

## 2016-03-17 DIAGNOSIS — J449 Chronic obstructive pulmonary disease, unspecified: Secondary | ICD-10-CM | POA: Diagnosis not present

## 2016-03-17 DIAGNOSIS — J069 Acute upper respiratory infection, unspecified: Secondary | ICD-10-CM | POA: Diagnosis not present

## 2016-03-17 DIAGNOSIS — I341 Nonrheumatic mitral (valve) prolapse: Secondary | ICD-10-CM | POA: Diagnosis not present

## 2016-03-17 DIAGNOSIS — M47896 Other spondylosis, lumbar region: Secondary | ICD-10-CM | POA: Diagnosis not present

## 2016-03-17 DIAGNOSIS — M533 Sacrococcygeal disorders, not elsewhere classified: Secondary | ICD-10-CM | POA: Diagnosis not present

## 2016-03-17 DIAGNOSIS — I129 Hypertensive chronic kidney disease with stage 1 through stage 4 chronic kidney disease, or unspecified chronic kidney disease: Secondary | ICD-10-CM | POA: Insufficient documentation

## 2016-03-17 DIAGNOSIS — Z950 Presence of cardiac pacemaker: Secondary | ICD-10-CM | POA: Insufficient documentation

## 2016-03-17 DIAGNOSIS — K219 Gastro-esophageal reflux disease without esophagitis: Secondary | ICD-10-CM

## 2016-03-17 DIAGNOSIS — Z5181 Encounter for therapeutic drug level monitoring: Secondary | ICD-10-CM

## 2016-03-17 DIAGNOSIS — N390 Urinary tract infection, site not specified: Secondary | ICD-10-CM | POA: Insufficient documentation

## 2016-03-17 DIAGNOSIS — Z79891 Long term (current) use of opiate analgesic: Secondary | ICD-10-CM

## 2016-03-17 DIAGNOSIS — X58XXXA Exposure to other specified factors, initial encounter: Secondary | ICD-10-CM | POA: Insufficient documentation

## 2016-03-17 DIAGNOSIS — M4806 Spinal stenosis, lumbar region: Secondary | ICD-10-CM | POA: Insufficient documentation

## 2016-03-17 DIAGNOSIS — G8929 Other chronic pain: Secondary | ICD-10-CM | POA: Insufficient documentation

## 2016-03-17 DIAGNOSIS — M961 Postlaminectomy syndrome, not elsewhere classified: Secondary | ICD-10-CM | POA: Insufficient documentation

## 2016-03-17 DIAGNOSIS — N183 Chronic kidney disease, stage 3 (moderate): Secondary | ICD-10-CM | POA: Diagnosis not present

## 2016-03-17 DIAGNOSIS — F1190 Opioid use, unspecified, uncomplicated: Secondary | ICD-10-CM

## 2016-03-17 DIAGNOSIS — E559 Vitamin D deficiency, unspecified: Secondary | ICD-10-CM | POA: Diagnosis not present

## 2016-03-17 DIAGNOSIS — R7303 Prediabetes: Secondary | ICD-10-CM | POA: Diagnosis not present

## 2016-03-17 DIAGNOSIS — R350 Frequency of micturition: Secondary | ICD-10-CM

## 2016-03-17 DIAGNOSIS — Z7901 Long term (current) use of anticoagulants: Secondary | ICD-10-CM | POA: Insufficient documentation

## 2016-03-17 DIAGNOSIS — G2581 Restless legs syndrome: Secondary | ICD-10-CM | POA: Diagnosis not present

## 2016-03-17 DIAGNOSIS — Z87891 Personal history of nicotine dependence: Secondary | ICD-10-CM | POA: Insufficient documentation

## 2016-03-17 DIAGNOSIS — M4316 Spondylolisthesis, lumbar region: Secondary | ICD-10-CM | POA: Diagnosis not present

## 2016-03-17 DIAGNOSIS — M545 Low back pain: Secondary | ICD-10-CM | POA: Insufficient documentation

## 2016-03-17 DIAGNOSIS — R351 Nocturia: Secondary | ICD-10-CM | POA: Diagnosis not present

## 2016-03-17 DIAGNOSIS — K5903 Drug induced constipation: Secondary | ICD-10-CM

## 2016-03-17 DIAGNOSIS — T402X5A Adverse effect of other opioids, initial encounter: Secondary | ICD-10-CM

## 2016-03-17 DIAGNOSIS — I5032 Chronic diastolic (congestive) heart failure: Secondary | ICD-10-CM | POA: Diagnosis not present

## 2016-03-17 DIAGNOSIS — I4892 Unspecified atrial flutter: Secondary | ICD-10-CM | POA: Insufficient documentation

## 2016-03-17 DIAGNOSIS — Z853 Personal history of malignant neoplasm of breast: Secondary | ICD-10-CM | POA: Insufficient documentation

## 2016-03-17 DIAGNOSIS — I495 Sick sinus syndrome: Secondary | ICD-10-CM | POA: Diagnosis not present

## 2016-03-17 DIAGNOSIS — M75102 Unspecified rotator cuff tear or rupture of left shoulder, not specified as traumatic: Secondary | ICD-10-CM | POA: Insufficient documentation

## 2016-03-17 DIAGNOSIS — Z7982 Long term (current) use of aspirin: Secondary | ICD-10-CM | POA: Insufficient documentation

## 2016-03-17 MED ORDER — OXYCODONE HCL 20 MG PO TABS: 20.0000 mg | ORAL_TABLET | Freq: Three times a day (TID) | ORAL | 0 refills | Status: DC | PRN

## 2016-03-17 MED ORDER — ROPINIROLE HCL 2 MG PO TABS: 2.0000 mg | ORAL_TABLET | Freq: Every day | ORAL | 2 refills | Status: DC

## 2016-03-17 MED ORDER — BISACODYL 5 MG PO TBEC: 10.0000 mg | DELAYED_RELEASE_TABLET | Freq: Every day | ORAL | 2 refills | Status: DC | PRN

## 2016-03-17 MED ORDER — MAGNESIUM OXIDE 400 MG PO TABS: 400.0000 mg | ORAL_TABLET | Freq: Every day | ORAL | 2 refills | Status: DC

## 2016-03-17 MED ORDER — BENEFIBER PO POWD: ORAL | 2 refills | Status: DC

## 2016-03-17 NOTE — Patient Instructions (Signed)
Facet Blocks Patient Information  Description: The facets are joints in the spine between the vertebrae.  Like any joints in the body, facets can become irritated and painful.  Arthritis can also effect the facets.  By injecting steroids and local anesthetic in and around these joints, we can temporarily block the nerve supply to them.  Steroids act directly on irritated nerves and tissues to reduce selling and inflammation which often leads to decreased pain.  Facet blocks may be done anywhere along the spine from the neck to the low back depending upon the location of your pain.   After numbing the skin with local anesthetic (like Novocaine), a small needle is passed onto the facet joints under x-ray guidance.  You may experience a sensation of pressure while this is being done.  The entire block usually lasts about 15-25 minutes.   Conditions which may be treated by facet blocks:   Low back/buttock pain  Neck/shoulder pain  Certain types of headaches  Preparation for the injection:  1. Do not eat any solid food or dairy products within 8 hours of your appointment. 2. You may drink clear liquid up to 3 hours before appointment.  Clear liquids include water, black coffee, juice or soda.  No milk or cream please. 3. You may take your regular medication, including pain medications, with a sip of water before your appointment.  Diabetics should hold regular insulin (if taken separately) and take 1/2 normal NPH dose the morning of the procedure.  Carry some sugar containing items with you to your appointment. 4. A driver must accompany you and be prepared to drive you home after your procedure. 5. Bring all your current medications with you. 6. An IV may be inserted and sedation may be given at the discretion of the physician. 7. A blood pressure cuff, EKG and other monitors will often be applied during the procedure.  Some patients may need to have extra oxygen administered for a short  period. 8. You will be asked to provide medical information, including your allergies and medications, prior to the procedure.  We must know immediately if you are taking blood thinners (like Coumadin/Warfarin) or if you are allergic to IV iodine contrast (dye).  We must know if you could possible be pregnant.  Possible side-effects:   Bleeding from needle site  Infection (rare, may require surgery)  Nerve injury (rare)  Numbness & tingling (temporary)  Difficulty urinating (rare, temporary)  Spinal headache (a headache worse with upright posture)  Light-headedness (temporary)  Pain at injection site (serveral days)  Decreased blood pressure (rare, temporary)  Weakness in arm/leg (temporary)  Pressure sensation in back/neck (temporary)   Call if you experience:   Fever/chills associated with headache or increased back/neck pain  Headache worsened by an upright position  New onset, weakness or numbness of an extremity below the injection site  Hives or difficulty breathing (go to the emergency room)  Inflammation or drainage at the injection site(s)  Severe back/neck pain greater than usual  New symptoms which are concerning to you  Please note:  Although the local anesthetic injected can often make your back or neck feel good for several hours after the injection, the pain will likely return. It takes 3-7 days for steroids to work.  You may not notice any pain relief for at least one week.  If effective, we will often do a series of 2-3 injections spaced 3-6 weeks apart to maximally decrease your pain.  After the initial   series, you may be a candidate for a more permanent nerve block of the facets.  If you have any questions, please call [phone] [facility]  Epidural Steroid Injection Patient Information  Description: The epidural space surrounds the nerves as they exit the spinal cord.  In some patients, the nerves can  be compressed and inflamed by a bulging disc or a tight spinal canal (spinal stenosis).  By injecting steroids into the epidural space, we can bring irritated nerves into direct contact with a potentially helpful medication.  These steroids act directly on the irritated nerves and can reduce swelling and inflammation which often leads to decreased pain.  Epidural steroids may be injected anywhere along the spine and from the neck to the low back depending upon the location of your pain.   After numbing the skin with local anesthetic (like Novocaine), a small needle is passed into the epidural space slowly.  You may experience a sensation of pressure while this is being done.  The entire block usually last less than 10 minutes.  Conditions which may be treated by epidural steroids:   Low back and leg pain  Neck and arm pain  Spinal stenosis  Post-laminectomy syndrome  Herpes zoster (shingles) pain  Pain from compression fractures  Preparation for the injection:  1. Do not eat any solid food or dairy products within 8 hours of your appointment.  2. You may drink clear liquids up to 3 hours before appointment.  Clear liquids include water, black coffee, juice or soda.  No milk or cream please. 3. You may take your regular medication, including pain medications, with a sip of water before your appointment  Diabetics should hold regular insulin (if taken separately) and take 1/2 normal NPH dos the morning of the procedure.  Carry some sugar containing items with you to your appointment. 4. A driver must accompany you and be prepared to drive you home after your procedure.  5. Bring all your current medications with your. 6. An IV may be inserted and sedation may be given at the discretion of the physician.   7. A blood pressure cuff, EKG and other monitors will often be applied during the procedure.  Some patients may need to have extra oxygen administered for a short period. 8. You will be  asked to provide medical information, including your allergies, prior to the procedure.  We must know immediately if you are taking blood thinners (like Coumadin/Warfarin)  Or if you are allergic to IV iodine contrast (dye). We must know if you could possible be pregnant.  Possible side-effects:  Bleeding from needle site  Infection (rare, may require surgery)  Nerve injury (rare)  Numbness & tingling (temporary)  Difficulty urinating (rare, temporary)  Spinal headache ( a headache worse with upright posture)  Light -headedness (temporary)  Pain at injection site (several days)  Decreased blood pressure (temporary)  Weakness in arm/leg (temporary)  Pressure sensation in back/neck (temporary)  Call if you experience:  Fever/chills associated with headache or increased back/neck pain.  Headache worsened by an upright position.  New onset weakness or numbness of an extremity below the injection site  Hives or difficulty breathing (go to the emergency room)  Inflammation or drainage at the infection site  Severe back/neck pain  Any new symptoms which are concerning to you  Please note:  Although the local anesthetic injected can often make your back or neck feel good for several hours after the injection,   the pain will likely return.  It takes 3-7 days for steroids to work in the epidural space.  You may not notice any pain relief for at least that one week.  If effective, we will often do a series of three injections spaced 3-6 weeks apart to maximally decrease your pain.  After the initial series, we generally will wait several months before considering a repeat injection of the same type.  If you have any questions, please call [phone] [facility] 

## 2016-03-17 NOTE — Progress Notes (Signed)
Safety precautions to be maintained throughout the outpatient stay will include: orient to surroundings, keep bed in low position, maintain call bell within reach at all times, provide assistance with transfer out of bed and ambulation.  Oxycodone pill count # 0/90  Filled 02-16-16  Took last one on 03-17-16

## 2016-03-17 NOTE — Progress Notes (Signed)
1:48 PM   Kristin [name] [birth year] [id number]  Referring provider: Arlis Porta., MD [address] [place], [place] [address]  Chief Complaint  Patient presents with  . Nocturia    6 month follow up    HPI: Patient is an 82 year old female with urinary frequency and nocturia who presents today for a follow up visit.  Patient has a history of overactive bladder with most bothersome symptom of nocturia. She has tried oxybutynin, Toviaz and Vesicare in the past. She states the Vesicare improved her symptoms the most that she quit that she was unable to afford it because her insurance would not cover the medication.  Most recently she completed a trial of Myrbetriq and stated that his did not prove improve her symptoms. She reports her symptoms are the worst at night. When she leaves the house and is out running errands she noticed that her symptoms improve.    Her nephrologist restarted her on the Vesicare approximately 4 weeks ago.  She feels that it is helping.  She used to get up every two hours during the night and now it has extended to every three hours.  Otherwise she states she is asymptomatic.  No lower extremity swelling.  She is visiting the restroom every three hours while awake.  That has not changed with the Vesicare.   She did not pursue a sleep study, but she is on oxygen at night.    PMH: Past Medical History:  Diagnosis Date  . Arthritis   . Arthritis   . Atrial fibrillation ([place])   . Back pain    lower back chronic  . Bradycardia   . Cancer [facility]) 2004   rt breast cancer-post lumpectomy- chemo/rad  . CHF (congestive heart failure) ([place])   . Chronic back pain   . CKD (chronic kidney disease)   . COPD (chronic obstructive pulmonary disease) ([facility])    wears O2 at 2L via [place] at night  . Cystocele   . Decubitus ulcers   . Gout   . Hematuria   . Hepatitis C   . Hyperlipidemia   . Hypertension   . Hypomagnesemia 06/25/2015  . OAB (overactive bladder)   .  Overactive bladder   . Restless leg   . Urinary frequency   . UTI (lower urinary tract infection)   . Vaginal atrophy     Surgical History: Past Surgical History:  Procedure Laterality Date  . ABDOMINAL HYSTERECTOMY    . BACK SURGERY    . BREAST SURGERY     rt lumpectomy  . CARPAL TUNNEL RELEASE    . CATARACT EXTRACTION W/PHACO  10/19/2011   Procedure: CATARACT EXTRACTION PHACO AND INTRAOCULAR LENS PLACEMENT (IOC);  Surgeon: Elta Guadeloupe T. Gershon Crane, MD;  Location: [facility];  Service: Ophthalmology;  Laterality: Right;  CDE:10.81  . CATARACT EXTRACTION W/PHACO  11/02/2011   Procedure: CATARACT EXTRACTION PHACO AND INTRAOCULAR LENS PLACEMENT (IOC);  Surgeon: Elta Guadeloupe T. Gershon Crane, MD;  Location: [facility];  Service: Ophthalmology;  Laterality: Left;  CDE 8.60  . COLON SURGERY    . JOINT REPLACEMENT     bilateral TKA  . PACEMAKER INSERTION N/A 12/24/2015   Procedure: INSERTION PACEMAKER;  Surgeon: Isaias Cowman, MD;  Location: [facility];  Service: Cardiovascular;  Laterality: N/A;  . ROTATOR CUFF REPAIR     bilateral    Home Medications:    Medication List       Accurate as of 03/17/16  1:48 PM. Always use your most recent  med list.          albuterol (2.5 MG/3ML) 0.083% nebulizer solution Commonly known as:  PROVENTIL Take 3 mLs (2.5 mg total) by nebulization every 8 (eight) hours as needed. Shortness of breath and wheezing   apixaban 5 MG Tabs tablet Commonly known as:  ELIQUIS Take 1 tablet (5 mg total) by mouth 2 (two) times daily.   aspirin EC 325 MG tablet Take by mouth.   BENEFIBER Powd Stir 2 teaspoons of Benefiber into 4-8 oz of any non-carbonated beverage or soft food (hot or cold) PO TID   bisacodyl 5 MG EC tablet Commonly known as:  DULCOLAX Take 2 tablets (10 mg total) by mouth daily as needed for moderate constipation.   calcium citrate-vitamin D 315-200 MG-UNIT tablet Commonly known as:  CITRACAL+D Take 1 tablet by mouth 2 (two) times daily.   cephALEXin 250 MG  capsule Commonly known as:  KEFLEX   docusate calcium 240 MG capsule Commonly known as:  SURFAK Take 240 mg by mouth.   furosemide 20 MG tablet Commonly known as:  LASIX Take 1 tablet (20 mg total) by mouth daily.   levocetirizine 5 MG tablet Commonly known as:  XYZAL Take 1 tablet (5 mg total) by mouth every evening.   lisinopril 10 MG tablet Commonly known as:  PRINIVIL,ZESTRIL Take 1 tablet (10 mg total) by mouth daily.   magnesium oxide 400 MG tablet Commonly known as:  MAG-OX Take 1 tablet (400 mg total) by mouth daily.   Melatonin 5 MG Tabs Take 5 mg by mouth.   omeprazole 20 MG capsule Commonly known as:  PRILOSEC Take 1 capsule (20 mg total) by mouth daily.   Oxycodone HCl 20 MG Tabs Take 1 tablet (20 mg total) by mouth every 8 (eight) hours as needed.   Oxycodone HCl 20 MG Tabs Take 1 tablet (20 mg total) by mouth every 8 (eight) hours as needed.   Oxycodone HCl 20 MG Tabs Take 1 tablet (20 mg total) by mouth every 8 (eight) hours as needed.   OXYGEN Inhale 2 Doses into the lungs at bedtime.   rOPINIRole 2 MG tablet Commonly known as:  REQUIP Take 1 tablet (2 mg total) by mouth at bedtime.   solifenacin 5 MG tablet Commonly known as:  VESICARE Take 5 mg by mouth.       Allergies:  Allergies  Allergen Reactions  . Cyclobenzaprine Rash and Hives    Severe Rash  . Flexeril [Cyclobenzaprine Hcl]     Severe Rash  . Vancomycin Rash    [place] Syndrome    Family History: Family History  Problem Relation Age of Onset  . Kidney disease Brother   . Heart disease Father   . Heart disease Mother   . Stroke Mother   . Cancer Sister   . Breast cancer Sister 51  . Breast cancer Paternal Aunt   . Anesthesia problems Neg Hx   . Hypotension Neg Hx   . Malignant hyperthermia Neg Hx   . Pseudochol deficiency Neg Hx     Social History:  reports that she quit smoking about 17 years ago. Her smoking use included Cigarettes. She has a 50.00 pack-year  smoking history. She has never used smokeless tobacco. She reports that she does not drink alcohol or use drugs.  ROS: UROLOGY Frequent Urination?: Yes Hard to postpone urination?: No Burning/pain with urination?: No Get up at night to urinate?: Yes Leakage of urine?: No Urine stream starts and  stops?: No Trouble starting stream?: No Do you have to strain to urinate?: No Blood in urine?: No Urinary tract infection?: No Sexually transmitted disease?: No Injury to kidneys or bladder?: No Painful intercourse?: No Weak stream?: Yes Currently pregnant?: No Vaginal bleeding?: No Last menstrual period?: n  Gastrointestinal Nausea?: No Vomiting?: No Indigestion/heartburn?: No Diarrhea?: No Constipation?: Yes  Constitutional Fever: No Night sweats?: No Weight loss?: No Fatigue?: Yes  Skin Skin rash/lesions?: No Itching?: No  Eyes Blurred vision?: No Double vision?: No  Ears/Nose/Throat Sore throat?: No Sinus problems?: No  Hematologic/Lymphatic Swollen glands?: No Easy bruising?: No  Cardiovascular Leg swelling?: No Chest pain?: No  Respiratory Cough?: No Shortness of breath?: Yes  Endocrine Excessive thirst?: No  Musculoskeletal Back pain?: Yes Joint pain?: No  Neurological Headaches?: No Dizziness?: No  Psychologic Depression?: No Anxiety?: No  Physical Exam: BP 101/70   Pulse 85   Ht '5\' 7"'  (1.702 m)   Wt 172 lb (78 kg)   BMI 26.94 kg/m   Constitutional:  Alert and oriented, No acute distress. HEENT: [place] AT, moist mucus membranes.  Trachea midline, no masses. Cardiovascular: No clubbing, cyanosis, or edema. Respiratory: Normal respiratory effort, no increased work of breathing. Skin: No rashes, bruises or suspicious lesions. Neurologic: Grossly intact, no focal deficits, moving all 4 extremities. Psychiatric: Normal mood and affect.  Laboratory Data:   Urinalysis Results for orders placed or performed in visit on 02/19/16  COMPLETE  METABOLIC PANEL WITH GFR  Result Value Ref Range   Sodium 143 135 - 146 mmol/L   Potassium 4.6 3.5 - 5.3 mmol/L   Chloride 105 98 - 110 mmol/L   CO2 27 20 - 31 mmol/L   Glucose, Bld 81 65 - 99 mg/dL   BUN 21 7 - 25 mg/dL   Creat 1.16 (H) 0.60 - 0.88 mg/dL   Total Bilirubin 0.5 0.2 - 1.2 mg/dL   Alkaline Phosphatase 74 33 - 130 U/L   AST 21 10 - 35 U/L   ALT 10 6 - 29 U/L   Total Protein 6.7 6.1 - 8.1 g/dL   Albumin 3.7 3.6 - 5.1 g/dL   Calcium 10.0 8.6 - 10.4 mg/dL   GFR, Est African American 50 (L) >=60 mL/min   GFR, Est Non African American 43 (L) >=60 mL/min  Lipid Profile  Result Value Ref Range   Cholesterol 204 (H) 125 - 200 mg/dL   Triglycerides 76 <150 mg/dL   HDL 58 >=46 mg/dL   Total CHOL/HDL Ratio 3.5 <=5.0 Ratio   VLDL 15 <30 mg/dL   LDL Cholesterol 131 (H) <130 mg/dL  CBC with Differential  Result Value Ref Range   WBC 5.9 3.8 - 10.8 K/uL   RBC 4.58 3.80 - 5.10 MIL/uL   Hemoglobin 13.5 11.7 - 15.5 g/dL   HCT 41.6 35.0 - 45.0 %   MCV 90.8 80.0 - 100.0 fL   MCH 29.5 27.0 - 33.0 pg   MCHC 32.5 32.0 - 36.0 g/dL   RDW 13.7 11.0 - 15.0 %   Platelets 191 140 - 400 K/uL   MPV 10.4 7.5 - 12.5 fL   Neutro Abs 2,537 1,500 - 7,800 cells/uL   Lymphs Abs 2,360 850 - 3,900 cells/uL   Monocytes Absolute 472 200 - 950 cells/uL   Eosinophils Absolute 472 15 - 500 cells/uL   Basophils Absolute 59 0 - 200 cells/uL   Neutrophils Relative % 43 %   Lymphocytes Relative 40 %   Monocytes Relative 8 %  Eosinophils Relative 8 %   Basophils Relative 1 %   Smear Review Criteria for review not met      Assessment & Plan:    1. Urinary frequency:   Patient has been restarted on her Vesicare by nephrology and has noticed an improvement.  She will RTC in 3 months.     2. Nocturia:    Patient will continue the [place].  She does not feel the need to have a sleep study.  She will RTC in 3 months for PVR and symptom recheck.      Return in about 3 years (around 03/18/2019) for  PVR and symptom recheck .  These notes generated with voice recognition software. I apologize for typographical errors.  Zara Council, [facility] [address] [place], [place] [address] [phone]

## 2016-03-17 NOTE — Progress Notes (Signed)
Patient's Name: Kristin [name]  Patient type: Established  MRN: [id number]  Service setting: Ambulatory outpatient  DOB: August 25, 1929  Location: [facility]  DOS: 03/17/2016  Primary Care Physician: Dicky Doe, MD  Note by: Kathlen Brunswick. Dossie Arbour, M.D, DABA, [place], DABPM, DABIPP, FIPP  Referring Physician: Arlis Porta., MD  Specialty: Board-Certified Interventional Pain Management  Last Visit to Pain Management: 02/16/2016   Primary Reason(s) for Visit: Encounter for prescription drug management (Level of risk: moderate) CC: Back Pain (low)   HPI  Kristin [name] is a 82 y.o. year old, female patient, who returns today as an established patient. She has Abnormal glucose tolerance test; Chronic diastolic heart failure ([place]); Chronic kidney disease (CKD), stage III (moderate); CAFL (chronic airflow limitation) ([place]); Complete rotator cuff rupture of shoulder (Left); CCF (congestive cardiac failure) (HCC); DDD (degenerative disc disease), lumbosacral; Gout; Essential (primary) hypertension; Benign essential HTN; BP (high blood pressure); H/O cardiac catheterization; Arrhythmia, sinus node ([place]); Upper respiratory infection, viral; Lumbar canal stenosis; Urinary frequency; Chronic obstructive pulmonary disease ([place]); Congestive heart failure ([place]); Degeneration of intervertebral disc of lumbosacral region; 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[place]); History of cardiac catheterization; Sinoatrial node dysfunction ([place]); Need for influenza vaccination; Frequency of urination; Seasonal allergies; Breast cancer, right breast ([place]); Chronic pain; Chronic pain syndrome; Chronic low back pain (Location of Primary Source of Pain) (Bilateral) (L>R); Lumbar facet syndrome (Bilateral) (L>R); Lumbar spondylosis; Chronic lumbar radicular pain (Bilateral) (L>R) (L5 [place]); Restless leg syndrome; Myofascial pain; Neuropathic pain; Neurogenic pain; Long term current use of opiate analgesic; Long term  prescription opiate use; Opiate use (90 MME/Day); Encounter for therapeutic drug level monitoring; Chronic constipation; Opioid-induced constipation (OIC); Vitamin D insufficiency; Grade 1 Retrolisthesis of L1 over L2; Lumbar facet hypertrophy (L1-2, L2-3, and L4-5); Stenosis of lateral recess of lumbar spine (L1-2 and L4-5); 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); Epidural fibrosis; Lumbar postlaminectomy syndrome; Chronic hip pain (bilateral) (L>R); 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 Her primarily concern today is the Back Pain (low)   Pain Assessment: Self-Reported Pain Score: 8  Clinically the patient looks like a 1/10 Reported level is inconsistent with clinical obrservations Information on the proper use of the pain score provided to the patient today. Pain Type: Chronic pain Pain Location: Back Pain Descriptors / Indicators: Aching Pain Frequency: Constant  The patient comes into the clinics today for pharmacological management of her chronic pain. I last saw this patient on 02/16/2016. The patient  reports that she does not use drugs. Her body mass index is 25.1 kg/m.  Date of Last Visit: 12/18/15 Service Provided on Last Visit: Med Refill  Controlled Substance Pharmacotherapy Assessment & REMS (Risk Evaluation and Mitigation Strategy)  Analgesic: Oxycodone IR 20 mg every 8 hours (60 mg/day) MME/day: 90 mg/day  Pill Count: Oxycodone pill count # 0/90  Filled 02-16-16  Took last one on  03-17-16. Pharmacokinetics: Onset of action (Liberation/Absorption): Within expected pharmacological parameters Time to Peak effect (Distribution): Timing and results are as within normal expected parameters Duration of action (Metabolism/Excretion): Within normal limits for medication Pharmacodynamics: Analgesic Effect: More than 50% Activity Facilitation: Medication(s) allow  patient to sit, stand, walk, and do the basic ADLs Perceived Effectiveness: Described as relatively effective, allowing for increase in activities of daily living (ADL) Side-effects or Adverse reactions: None reported Monitoring: [place] PMP: Online review of the past 1-monthperiod conducted. Compliant with practice rules and regulations Last UDS on record: ToxAssure Select 13  Date Value Ref Range Status  12/18/2015 FINAL  Final    Comment:    ==================================================================== TOXASSURE SELECT 13 (MW) ==================================================================== Test                             Result       Flag       Units Drug Present and Declared for Prescription Verification   Oxycodone                      2377         EXPECTED   ng/mg creat   Oxymorphone                    2225         EXPECTED   ng/mg creat   Noroxycodone                   7596         EXPECTED   ng/mg creat   Noroxymorphone                 911          EXPECTED   ng/mg creat    Sources of oxycodone are scheduled prescription medications.    Oxymorphone, noroxycodone, and noroxymorphone are expected    metabolites of oxycodone. Oxymorphone is also available as a    scheduled prescription medication. ==================================================================== Test                      Result    Flag   Units      Ref Range   Creatinine              103              mg/dL      >=20 ==================================================================== Declared Medications:  The flagging and  interpretation on this report are based on the  following declared medications.  Unexpected results may arise from  inaccuracies in the declared medications.  **Note: The testing scope of this panel includes these medications:  Oxycodone  **Note: The testing scope of this panel does not include following  reported medications:  Albuterol  Apixaban  Aspirin  Bisacodyl  Calcium citrate (Calcium citrate/Vitamin D)  Cholecalciferol  Furosemide  Levocetirizine  Lisinopril  Magnesium Oxide  Omeprazole  Oxygen  Ropinirole  Supplement  Vitamin D (Calcium citrate/Vitamin D) ==================================================================== For clinical consultation, please call ([phone] ====================================================================    UDS interpretation: Compliant          Medication Assessment Form: Reviewed. Patient indicates being compliant with therapy Treatment compliance: Compliant Risk Assessment: Aberrant Behavior: None observed today Substance Use Disorder (SUD) Risk Level: Low-to-moderate Risk of opioid abuse or dependence: 0.7-3.0% with doses ? 36 MME/day and 6.1-26% with doses ? 120 MME/day. Opioid Risk Tool (ORT) Score: Total Score: 0 Low Risk for SUD (Score <3) Depression Scale Score: PHQ-2: PHQ-2 Total Score: 0 No depression (0) PHQ-9: PHQ-9 Total Score: 0 No depression (0-4)  Pharmacologic Plan: No change in therapy, at this time  Laboratory Chemistry  Inflammation Markers Lab Results  Component Value Date   ESRSEDRATE 12 09/24/2015   CRP <0.5 09/24/2015    Renal Function Lab Results  Component Value Date   BUN 21 03/02/2016   CREATININE 1.16 (H) 03/02/2016   GFRAA 50 (L) 03/02/2016   GFRNONAA 43 (L) 03/02/2016    Hepatic Function Lab Results  Component Value Date   AST 21 03/02/2016   ALT 10 03/02/2016   ALBUMIN 3.7 03/02/2016    Electrolytes Lab Results  Component Value Date   NA 143 03/02/2016   K 4.6  03/02/2016   CL 105 03/02/2016   CALCIUM 10.0 03/02/2016   MG 2.2 09/24/2015    Pain Modulating Vitamins No results found for: Maralyn Sago [id number], HG9924QA8, TM1962IW9, 25OHVITD1, 25OHVITD2, 25OHVITD3, VITAMINB12  Coagulation Parameters Lab Results  Component Value Date   INR 1.24 12/16/2015   LABPROT 15.8 (H) 12/16/2015   APTT 26 12/16/2015   PLT 191 03/02/2016    Cardiovascular Lab Results  Component Value Date   HGB 13.5 03/02/2016   HCT 41.6 03/02/2016    Note: Lab results reviewed.  Recent Diagnostic Imaging  Dg Chest 2 View  Result Date: 12/16/2015 CLINICAL DATA:  82 year old female with a history of preoperative chest x-ray for pacemaker insertion EXAM: CHEST - 2 VIEW COMPARISON:  10/28/2015, 08/27/2014, CT chest 09/07/2014 FINDINGS: Cardiomediastinal silhouette unchanged in size and contour. Atherosclerotic calcifications of the aortic arch. Tortuosity of descending thoracic aorta. Coarsened interstitial markings bilaterally. No confluent airspace disease. Stigmata of emphysema, with increased retrosternal airspace, flattened hemidiaphragms, increased AP diameter, and hyperinflation on the AP view. No pleural effusion or pneumothorax. Surgical changes of the right axilla. No displaced fracture. Multilevel degenerative changes of the spine. Surgical changes of bilateral shoulder reverse arthroplasty. IMPRESSION: Emphysema and chronic lung changes without evidence of superimposed acute cardiopulmonary disease. Atherosclerosis Signed, Dulcy Fanny. Earleen Newport, DO [facility] Electronically Signed   By: Corrie Mckusick D.O.   On: 12/16/2015 10:50    Meds  The patient has a current medication list which includes the following prescription(s): albuterol, apixaban, aspirin ec, bisacodyl, calcium citrate-vitamin d, furosemide, levocetirizine, lisinopril, magnesium oxide, omeprazole, oxycodone hcl, oxycodone hcl, oxycodone hcl,  oxygen-helium, ropinirole, and benefiber.  Current Outpatient Prescriptions on File Prior to Visit  Medication Sig  . albuterol (PROVENTIL) (2.5 MG/3ML) 0.083% nebulizer solution Take 3 mLs (2.5 mg total) by nebulization every 8 (eight) hours as needed. Shortness of breath and wheezing  . apixaban (ELIQUIS) 5 MG TABS tablet Take 1 tablet (5 mg total) by mouth 2 (two) times daily.  . calcium citrate-vitamin D (CITRACAL+D) 315-200 MG-UNIT per tablet Take 1 tablet by mouth 2 (two) times daily.  . furosemide (LASIX) 20 MG tablet Take 1 tablet (20 mg total) by mouth daily.  Marland Kitchen levocetirizine (XYZAL) 5 MG tablet Take 1 tablet (5 mg total) by mouth every evening.  Marland Kitchen lisinopril (PRINIVIL,ZESTRIL) 10 MG tablet Take 1 tablet (10 mg total) by mouth daily.  Marland Kitchen omeprazole (PRILOSEC) 20 MG capsule Take 1 capsule (20 mg total) by mouth daily.  . OXYGEN Inhale 2 Doses into the lungs at bedtime.   No current facility-administered medications on file prior to visit.     ROS  Constitutional: Denies any fever or chills Gastrointestinal: No reported hemesis, hematochezia, vomiting, or acute GI distress Musculoskeletal: Denies any acute onset joint swelling, redness, loss of ROM, or weakness Neurological: No reported episodes of acute onset apraxia, aphasia, dysarthria, agnosia, amnesia, paralysis, loss of coordination,  or loss of consciousness  Allergies  Ms. Ramseyer is allergic to cyclobenzaprine; flexeril [cyclobenzaprine hcl]; and vancomycin.  [place]  Medical:  Ms. Yohe  has a past medical history of Arthritis; Arthritis; Atrial fibrillation ([place]); Back pain; Bradycardia; Cancer [facility]) (2004); CHF (congestive heart failure) ([place]); Chronic back pain; CKD (chronic kidney disease); COPD (chronic obstructive pulmonary disease) ([place]); Cystocele; Decubitus ulcers; Gout; Hematuria; Hepatitis C; Hyperlipidemia; Hypertension; Hypomagnesemia (06/25/2015); OAB (overactive bladder); Overactive bladder; Restless leg; Urinary  frequency; UTI (lower urinary tract infection); and Vaginal atrophy. Family: family history includes Breast cancer in her paternal aunt; Breast cancer (age of onset: 43) in her sister; Cancer in her sister; Heart disease in her father and mother; Stroke in her mother. Surgical:  has a past surgical history that includes Abdominal hysterectomy; Back surgery; Rotator cuff repair; Breast surgery; Cataract extraction w/PHACO (10/19/2011); Cataract extraction w/PHACO (11/02/2011); Carpal tunnel release; Joint replacement; Colon surgery; and Pacemaker insertion (N/A, 12/24/2015). Tobacco:  reports that she quit smoking about 17 years ago. Her smoking use included Cigarettes. She has a 50.00 pack-year smoking history. She has never used smokeless tobacco. Alcohol:  reports that she does not drink alcohol. Drug:  reports that she does not use drugs.  Constitutional Exam  Vitals: Blood pressure 140/80, pulse 87, temperature 98.7 F (37.1 C), resp. rate 18, height '5\' 9"'$  (1.753 m), weight 170 lb (77.1 kg), SpO2 98 %. General appearance: Well nourished, well developed, and well hydrated. In no acute distress Calculated BMI/Body habitus: Body mass index is 25.1 kg/m. (25-29.9 kg/m2) Overweight - 20% higher incidence of chronic pain Psych/Mental status: Alert and oriented x 3 (person, place, & time) Eyes: PERLA Respiratory: No evidence of acute respiratory distress  Cervical Spine Exam  Inspection: No masses, redness, or swelling Alignment: Symmetrical ROM: Functional: ROM is within functional limits [facility]) Stability: No instability detected Muscle strength & Tone: Functionally intact Sensory: Unimpaired Palpation: No complaints of tenderness  Upper Extremity (UE) Exam    Side: Right upper extremity  Side: Left upper extremity  Inspection: No masses, redness, swelling, or asymmetry  Inspection: No masses, redness, swelling, or asymmetry  ROM:  ROM:  Functional: ROM is within functional limits [facility])         Functional: ROM is within functional limits [facility])        Muscle strength & Tone: Functionally intact  Muscle strength & Tone: Functionally intact  Sensory: Unimpaired  Sensory: Unimpaired  Palpation: No complaints of tenderness  Palpation: No complaints of tenderness   Thoracic Spine Exam  Inspection: No masses, redness, or swelling Alignment: Symmetrical ROM: Functional: ROM is within functional limits [facility]) Stability: No instability detected Sensory: Unimpaired Muscle strength & Tone: Functionally intact Palpation: No complaints of tenderness  Lumbar Spine Exam  Inspection: No masses, redness, or swelling Alignment: Symmetrical ROM: Functional: Reduced ROM Stability: No instability detected Muscle strength & Tone: Functionally intact Sensory: Movement-associated discomfort Palpation: No complaints of tenderness Provocative Tests: Lumbar Hyperextension and rotation test: Positive bilaterally for facet joint pain. Patrick's Maneuver: provocative test deferred today              Gait & Posture Assessment  Ambulation: Unassisted Gait: Antalgic Posture: WNL   Lower Extremity Exam    Side: Right lower extremity  Side: Left lower extremity  Inspection: No masses, redness, swelling, or asymmetry ROM:  Inspection: No masses, redness, swelling, or asymmetry ROM:  Functional: ROM is within functional limits [facility])        Functional: ROM is within functional  limits [facility])        Muscle strength & Tone: Functionally intact  Muscle strength & Tone: Functionally intact  Sensory: Unimpaired  Sensory: Unimpaired  Palpation: No complaints of tenderness  Palpation: No complaints of tenderness   Assessment & Plan  Primary Diagnosis & Pertinent Problem List: The primary encounter diagnosis was Chronic pain. Diagnoses of Long term current use of opiate analgesic, Encounter for therapeutic drug level monitoring, Opiate use (90 MME/Day), Chronic low back pain (Location of Primary Source of  Pain) (Bilateral) (L>R), Gastroesophageal reflux disease without esophagitis, Restless leg syndrome, and Opioid-induced constipation (OIC) were also pertinent to this visit.  Visit Diagnosis: 1. Chronic pain   2. Long term current use of opiate analgesic   3. Encounter for therapeutic drug level monitoring   4. Opiate use (90 MME/Day)   5. Chronic low back pain (Location of Primary Source of Pain) (Bilateral) (L>R)   6. Gastroesophageal reflux disease without esophagitis   7. Restless leg syndrome   8. Opioid-induced constipation (OIC)     Problems updated and reviewed during this visit: Problem  New Onset Atrial Flutter (Hcc)  Chronic Obstructive Pulmonary Disease (Hcc)    Problem-specific Plan(s): No problem-specific Assessment & Plan notes found for this encounter.  No new Assessment & Plan notes have been filed under this hospital service since the last note was generated. Service: Pain Management   Plan of Care   Problem List Items Addressed This Visit      High   Chronic low back pain (Location of Primary Source of Pain) (Bilateral) (L>R) (Chronic)   Relevant Medications   aspirin EC 325 MG tablet   Oxycodone HCl 20 MG TABS   Oxycodone HCl 20 MG TABS   Oxycodone HCl 20 MG TABS   Chronic pain - Primary (Chronic)   Relevant Medications   aspirin EC 325 MG tablet   Oxycodone HCl 20 MG TABS   Oxycodone HCl 20 MG TABS   Oxycodone HCl 20 MG TABS   Restless leg syndrome (Chronic)   Relevant Medications   rOPINIRole (REQUIP) 2 MG tablet     Medium   Encounter for therapeutic drug level monitoring   Long term current use of opiate analgesic (Chronic)   Opiate use (90 MME/Day) (Chronic)   Opioid-induced constipation (OIC) (Chronic)   Relevant Medications   bisacodyl (DULCOLAX) 5 MG EC tablet   Wheat Dextrin (BENEFIBER) POWD     Low   GERD (gastroesophageal reflux disease) (Chronic)   Relevant Medications   magnesium oxide (MAG-OX) 400 MG tablet   bisacodyl  (DULCOLAX) 5 MG EC tablet   Wheat Dextrin (BENEFIBER) POWD    Other Visit Diagnoses   None.      Pharmacotherapy (Medications Ordered): Meds ordered this encounter  Medications  . magnesium oxide (MAG-OX) 400 MG tablet    Sig: Take 1 tablet (400 mg total) by mouth daily.    Dispense:  30 tablet    Refill:  2    Do not place this medication, or any other prescription from our practice, on "Automatic Refill". Patient may have prescription filled one day early if pharmacy is closed on scheduled refill date.  Marland Kitchen rOPINIRole (REQUIP) 2 MG tablet    Sig: Take 1 tablet (2 mg total) by mouth at bedtime.    Dispense:  30 tablet    Refill:  2    Do not place this medication, or any other prescription from our practice, on "Automatic Refill". Patient may have prescription  filled one day early if pharmacy is closed on scheduled refill date.  . bisacodyl (DULCOLAX) 5 MG EC tablet    Sig: Take 2 tablets (10 mg total) by mouth daily as needed for moderate constipation.    Dispense:  60 tablet    Refill:  2    Do not place this medication, or any other prescription from our practice, on "Automatic Refill". Patient may have prescription filled one day early if pharmacy is closed on scheduled refill date.  . Wheat Dextrin (BENEFIBER) POWD    Sig: Stir 2 teaspoons of Benefiber into 4-8 oz of any non-carbonated beverage or soft food (hot or cold) PO TID    Dispense:  500 g    Refill:  2    This is an OTC product. This prescription is to serve as a reminder to the patient as to our preference.  . Oxycodone HCl 20 MG TABS    Sig: Take 1 tablet (20 mg total) by mouth every 8 (eight) hours as needed.    Dispense:  90 tablet    Refill:  0    Do not add this medication to the electronic "Automatic Refill" notification system. Patient may have prescription filled one day early if pharmacy is closed on scheduled refill date. Do not fill until: 03/17/16 To last until: 04/16/16  . Oxycodone HCl 20 MG TABS     Sig: Take 1 tablet (20 mg total) by mouth every 8 (eight) hours as needed.    Dispense:  90 tablet    Refill:  0    Do not add this medication to the electronic "Automatic Refill" notification system. Patient may have prescription filled one day early if pharmacy is closed on scheduled refill date. Do not fill until: 04/16/16 To last until: 05/16/16  . Oxycodone HCl 20 MG TABS    Sig: Take 1 tablet (20 mg total) by mouth every 8 (eight) hours as needed.    Dispense:  90 tablet    Refill:  0    Do not add this medication to the electronic "Automatic Refill" notification system. Patient may have prescription filled one day early if pharmacy is closed on scheduled refill date. Do not fill until: 05/16/16 To last until: 06/15/16    [facility] & Procedure Ordered: No orders of the defined types were placed in this encounter.   Imaging Ordered: None  Interventional Therapies: Scheduled:   None at this time.    Considering:   Possible bilateral lumbar facet radiofrequency.    PRN Procedures:   Diagnostic bilateral lumbar facet block under fluoroscopic guidance and IV sedation for the low back pain.    Referral(s) or Consult(s): None at this time.  New Prescriptions   No medications on file    Medications administered during this visit: Ms. Southard had no medications administered during this visit.  Requested PM Follow-up: Return in about 3 months (around 06/14/2016) for Med-Mgmt, (3-Mo), (PRN) Procedure.  Future Appointments Date Time Provider [place]  03/17/2016 1:30 PM Nori Riis, PA-C [facility] None  06/14/2016 10:20 AM Milinda Pointer, MD [facility] None  06/22/2016 11:00 AM Arlis Porta., MD [facility] None    Primary Care Physician: Dicky Doe, MD Location: [facility] Note by: Kathlen Brunswick. Dossie Arbour, M.D, DABA, DABAPM, DABPM, DABIPP, FIPP  Pain Score Disclaimer: We use the NRS-11 scale. This is a self-reported,  subjective measurement of pain severity with only modest accuracy. It is used primarily to identify changes  within a particular patient. It must be understood that outpatient pain scales are significantly less accurate that those used for research, where they can be applied under ideal controlled circumstances with minimal exposure to variables. In reality, the score is likely to be a combination of pain intensity and pain affect, where pain affect describes the degree of emotional arousal or changes in action readiness caused by the sensory experience of pain. Factors such as social and work situation, setting, emotional state, anxiety levels, expectation, and prior pain experience may influence pain perception and show large inter-individual differences that may also be affected by time variables.  Patient instructions provided during this appointment: Patient Instructions  Facet Blocks Patient Information  Description: The facets are joints in the spine between the vertebrae.  Like any joints in the body, facets can become irritated and painful.  Arthritis can also effect the facets.  By injecting steroids and local anesthetic in and around these joints, we can temporarily block the nerve supply to them.  Steroids act directly on irritated nerves and tissues to reduce selling and inflammation which often leads to decreased pain.  Facet blocks may be done anywhere along the spine from the neck to the low back depending upon the location of your pain.   After numbing the skin with local anesthetic (like Novocaine), a small needle is passed onto the facet joints under x-ray guidance.  You may experience a sensation of pressure while this is being done.  The entire block usually lasts about 15-25 minutes.   Conditions which may be treated by facet blocks:   Low back/buttock pain  Neck/shoulder pain  Certain types of headaches  Preparation for the injection:  1. Do not eat any solid food or dairy  products within 8 hours of your appointment. 2. You may drink clear liquid up to 3 hours before appointment.  Clear liquids include water, black coffee, juice or soda.  No milk or cream please. 3. You may take your regular medication, including pain medications, with a sip of water before your appointment.  Diabetics should hold regular insulin (if taken separately) and take 1/2 normal NPH dose the morning of the procedure.  Carry some sugar containing items with you to your appointment. 4. A driver must accompany you and be prepared to drive you home after your procedure. 5. Bring all your current medications with you. 6. An IV may be inserted and sedation may be given at the discretion of the physician. 7. A blood pressure cuff, EKG and other monitors will often be applied during the procedure.  Some patients may need to have extra oxygen administered for a short period. 8. You will be asked to provide medical information, including your allergies and medications, prior to the procedure.  We must know immediately if you are taking blood thinners (like Coumadin/Warfarin) or if you are allergic to IV iodine contrast (dye).  We must know if you could possible be pregnant.  Possible side-effects:   Bleeding from needle site  Infection (rare, may require surgery)  Nerve injury (rare)  Numbness & tingling (temporary)  Difficulty urinating (rare, temporary)  Spinal headache (a headache worse with upright posture)  Light-headedness (temporary)  Pain at injection site (serveral days)  Decreased blood pressure (rare, temporary)  Weakness in arm/leg (temporary)  Pressure sensation in back/neck (temporary)   Call if you experience:   Fever/chills associated with headache or increased back/neck pain  Headache worsened by an upright position  New onset, weakness or  numbness of an extremity below the injection site  Hives or difficulty breathing (go to the emergency  room)  Inflammation or drainage at the injection site(s)  Severe back/neck pain greater than usual  New symptoms which are concerning to you  Please note:  Although the local anesthetic injected can often make your back or neck feel good for several hours after the injection, the pain will likely return. It takes 3-7 days for steroids to work.  You may not notice any pain relief for at least one week.  If effective, we will often do a series of 2-3 injections spaced 3-6 weeks apart to maximally decrease your pain.  After the initial series, you may be a candidate for a more permanent nerve block of the facets.  If you have any questions, please call #[phone]) [facility]  Description: The epidural space surrounds the nerves as they exit the spinal cord.  In some patients, the nerves can be compressed and inflamed by a bulging disc or a tight spinal canal (spinal stenosis).  By injecting steroids into the epidural space, we can bring irritated nerves into direct contact with a potentially helpful medication.  These steroids act directly on the irritated nerves and can reduce swelling and inflammation which often leads to decreased pain.  Epidural steroids may be injected anywhere along the spine and from the neck to the low back depending upon the location of your pain.   After numbing the skin with local anesthetic (like Novocaine), a small needle is passed into the epidural space slowly.  You may experience a sensation of pressure while this is being done.  The entire block usually last less than 10 minutes.  Conditions which may be treated by epidural steroids:   Low back and leg pain  Neck and arm pain  Spinal stenosis  Post-laminectomy syndrome  Herpes zoster (shingles) pain  Pain from compression fractures  Preparation for the injection:  1. Do not eat any solid food or dairy products within  8 hours of your appointment.  2. You may drink clear liquids up to 3 hours before appointment.  Clear liquids include water, black coffee, juice or soda.  No milk or cream please. 3. You may take your regular medication, including pain medications, with a sip of water before your appointment  Diabetics should hold regular insulin (if taken separately) and take 1/2 normal NPH dos the morning of the procedure.  Carry some sugar containing items with you to your appointment. 4. A driver must accompany you and be prepared to drive you home after your procedure.  5. Bring all your current medications with your. 6. An IV may be inserted and sedation may be given at the discretion of the physician.   7. A blood pressure cuff, EKG and other monitors will often be applied during the procedure.  Some patients may need to have extra oxygen administered for a short period. 8. You will be asked to provide medical information, including your allergies, prior to the procedure.  We must know immediately if you are taking blood thinners (like Coumadin/Warfarin)  Or if you are allergic to IV iodine contrast (dye). We must know if you could possible be pregnant.  Possible side-effects:  Bleeding from needle site  Infection (rare, may require surgery)  Nerve injury (rare)  Numbness & tingling (temporary)  Difficulty urinating (rare, temporary)  Spinal headache ( a headache worse with upright posture)  Light -  headedness (temporary)  Pain at injection site (several days)  Decreased blood pressure (temporary)  Weakness in arm/leg (temporary)  Pressure sensation in back/neck (temporary)  Call if you experience:  Fever/chills associated with headache or increased back/neck pain.  Headache worsened by an upright position.  New onset weakness or numbness of an extremity below the injection site  Hives or difficulty breathing (go to the emergency room)  Inflammation or drainage at the infection  site  Severe back/neck pain  Any new symptoms which are concerning to you  Please note:  Although the local anesthetic injected can often make your back or neck feel good for several hours after the injection, the pain will likely return.  It takes 3-7 days for steroids to work in the epidural space.  You may not notice any pain relief for at least that one week.  If effective, we will often do a series of three injections spaced 3-6 weeks apart to maximally decrease your pain.  After the initial series, we generally will wait several months before considering a repeat injection of the same type.  If you have any questions, please call [phone] [facility]

## 2016-03-30 ENCOUNTER — Telehealth: Payer: Self-pay | Admitting: Pain Medicine

## 2016-03-30 NOTE — Telephone Encounter (Signed)
Spoke with patient to let her know that Requip was escribed on March 17, 2016 for 30 day supply and 2 refills to [facility] in [place].

## 2016-03-30 NOTE — Telephone Encounter (Signed)
Patient did not get script for Requip at her last visit, it is for restless leg syndrome, please call in and let patient know

## 2016-04-07 DIAGNOSIS — I495 Sick sinus syndrome: Secondary | ICD-10-CM | POA: Diagnosis not present

## 2016-04-07 DIAGNOSIS — I5032 Chronic diastolic (congestive) heart failure: Secondary | ICD-10-CM | POA: Diagnosis not present

## 2016-04-07 DIAGNOSIS — I4892 Unspecified atrial flutter: Secondary | ICD-10-CM | POA: Diagnosis not present

## 2016-04-07 DIAGNOSIS — J410 Simple chronic bronchitis: Secondary | ICD-10-CM | POA: Diagnosis not present

## 2016-04-07 DIAGNOSIS — I10 Essential (primary) hypertension: Secondary | ICD-10-CM | POA: Diagnosis not present

## 2016-04-10 DIAGNOSIS — J45998 Other asthma: Secondary | ICD-10-CM | POA: Diagnosis not present

## 2016-05-11 DIAGNOSIS — J45998 Other asthma: Secondary | ICD-10-CM | POA: Diagnosis not present

## 2016-06-10 DIAGNOSIS — J45998 Other asthma: Secondary | ICD-10-CM | POA: Diagnosis not present

## 2016-06-14 ENCOUNTER — Other Ambulatory Visit: Payer: Self-pay | Admitting: Family Medicine

## 2016-06-14 ENCOUNTER — Encounter: Payer: Self-pay | Admitting: Pain Medicine

## 2016-06-14 ENCOUNTER — Ambulatory Visit: Payer: Medicare Other | Attending: Pain Medicine | Admitting: Pain Medicine

## 2016-06-14 VITALS — BP 144/67 | HR 91 | Temp 98.2°F | Resp 16 | Ht 69.0 in | Wt 174.0 lb

## 2016-06-14 DIAGNOSIS — I495 Sick sinus syndrome: Secondary | ICD-10-CM | POA: Insufficient documentation

## 2016-06-14 DIAGNOSIS — K5909 Other constipation: Secondary | ICD-10-CM | POA: Insufficient documentation

## 2016-06-14 DIAGNOSIS — I130 Hypertensive heart and chronic kidney disease with heart failure and stage 1 through stage 4 chronic kidney disease, or unspecified chronic kidney disease: Secondary | ICD-10-CM | POA: Insufficient documentation

## 2016-06-14 DIAGNOSIS — Z79891 Long term (current) use of opiate analgesic: Secondary | ICD-10-CM | POA: Diagnosis not present

## 2016-06-14 DIAGNOSIS — Z79899 Other long term (current) drug therapy: Secondary | ICD-10-CM | POA: Insufficient documentation

## 2016-06-14 DIAGNOSIS — I509 Heart failure, unspecified: Secondary | ICD-10-CM | POA: Insufficient documentation

## 2016-06-14 DIAGNOSIS — Z9889 Other specified postprocedural states: Secondary | ICD-10-CM | POA: Diagnosis not present

## 2016-06-14 DIAGNOSIS — N390 Urinary tract infection, site not specified: Secondary | ICD-10-CM | POA: Diagnosis not present

## 2016-06-14 DIAGNOSIS — I341 Nonrheumatic mitral (valve) prolapse: Secondary | ICD-10-CM | POA: Diagnosis not present

## 2016-06-14 DIAGNOSIS — M533 Sacrococcygeal disorders, not elsewhere classified: Secondary | ICD-10-CM | POA: Diagnosis not present

## 2016-06-14 DIAGNOSIS — M48061 Spinal stenosis, lumbar region without neurogenic claudication: Secondary | ICD-10-CM | POA: Insufficient documentation

## 2016-06-14 DIAGNOSIS — M25552 Pain in left hip: Secondary | ICD-10-CM | POA: Diagnosis not present

## 2016-06-14 DIAGNOSIS — I4892 Unspecified atrial flutter: Secondary | ICD-10-CM | POA: Insufficient documentation

## 2016-06-14 DIAGNOSIS — M159 Polyosteoarthritis, unspecified: Secondary | ICD-10-CM

## 2016-06-14 DIAGNOSIS — M109 Gout, unspecified: Secondary | ICD-10-CM | POA: Diagnosis not present

## 2016-06-14 DIAGNOSIS — J449 Chronic obstructive pulmonary disease, unspecified: Secondary | ICD-10-CM | POA: Insufficient documentation

## 2016-06-14 DIAGNOSIS — K219 Gastro-esophageal reflux disease without esophagitis: Secondary | ICD-10-CM | POA: Diagnosis not present

## 2016-06-14 DIAGNOSIS — Z7901 Long term (current) use of anticoagulants: Secondary | ICD-10-CM | POA: Insufficient documentation

## 2016-06-14 DIAGNOSIS — M1991 Primary osteoarthritis, unspecified site: Secondary | ICD-10-CM | POA: Insufficient documentation

## 2016-06-14 DIAGNOSIS — G2581 Restless legs syndrome: Secondary | ICD-10-CM | POA: Insufficient documentation

## 2016-06-14 DIAGNOSIS — G894 Chronic pain syndrome: Secondary | ICD-10-CM

## 2016-06-14 DIAGNOSIS — Z950 Presence of cardiac pacemaker: Secondary | ICD-10-CM | POA: Diagnosis not present

## 2016-06-14 DIAGNOSIS — G8929 Other chronic pain: Secondary | ICD-10-CM

## 2016-06-14 DIAGNOSIS — J302 Other seasonal allergic rhinitis: Secondary | ICD-10-CM

## 2016-06-14 DIAGNOSIS — F1190 Opioid use, unspecified, uncomplicated: Secondary | ICD-10-CM | POA: Diagnosis not present

## 2016-06-14 DIAGNOSIS — R55 Syncope and collapse: Secondary | ICD-10-CM | POA: Insufficient documentation

## 2016-06-14 DIAGNOSIS — M1288 Other specific arthropathies, not elsewhere classified, other specified site: Secondary | ICD-10-CM

## 2016-06-14 DIAGNOSIS — M75102 Unspecified rotator cuff tear or rupture of left shoulder, not specified as traumatic: Secondary | ICD-10-CM | POA: Insufficient documentation

## 2016-06-14 DIAGNOSIS — Z87891 Personal history of nicotine dependence: Secondary | ICD-10-CM | POA: Diagnosis not present

## 2016-06-14 DIAGNOSIS — M47816 Spondylosis without myelopathy or radiculopathy, lumbar region: Secondary | ICD-10-CM

## 2016-06-14 DIAGNOSIS — M150 Primary generalized (osteo)arthritis: Secondary | ICD-10-CM

## 2016-06-14 DIAGNOSIS — M8949 Other hypertrophic osteoarthropathy, multiple sites: Secondary | ICD-10-CM | POA: Insufficient documentation

## 2016-06-14 DIAGNOSIS — M25511 Pain in right shoulder: Secondary | ICD-10-CM

## 2016-06-14 MED ORDER — MELOXICAM 7.5 MG PO TABS: 7.5000 mg | ORAL_TABLET | Freq: Every day | ORAL | 2 refills | Status: DC

## 2016-06-14 MED ORDER — OXYCODONE HCL 20 MG PO TABS: 20.0000 mg | ORAL_TABLET | Freq: Three times a day (TID) | ORAL | 0 refills | Status: DC | PRN

## 2016-06-14 NOTE — Patient Instructions (Signed)

## 2016-06-14 NOTE — Progress Notes (Signed)
Patient's Name: Kristin [name]  MRN: [id number]  Referring Provider: Arlis Porta., MD  DOB: 1929-11-07  PCP: Dicky Doe, MD  DOS: 06/14/2016  Note by: Kathlen Brunswick. Dossie Arbour, MD  Service setting: Ambulatory outpatient  Specialty: Interventional Pain Management  Location: [facility] (AMB) Pain Management Facility    Patient type: Established   Primary Reason(s) for Visit: Encounter for prescription drug management (Level of risk: moderate) CC: Back Pain (lower); Leg Pain (both legs, thighs); and Arm Pain (upper arm  right)  HPI  Kristin [name] is a 82 y.o. year old, female patient, who comes today for an initial evaluation. She has Abnormal glucose tolerance test; Chronic diastolic heart failure ([place]); Chronic kidney disease (CKD), stage III (moderate); CAFL (chronic airflow limitation) ([place]); Complete rotator cuff rupture of shoulder (Left); CCF (congestive cardiac failure) (HCC); DDD (degenerative disc disease), lumbosacral; Gout; Essential (primary) hypertension; Benign essential HTN; BP (high blood pressure); H/O cardiac catheterization; Arrhythmia, sinus node ([place]); Upper respiratory infection, viral; Lumbar canal stenosis; Urinary frequency; Chronic obstructive pulmonary disease ([place]); Congestive heart failure ([place]); Degeneration of intervertebral disc of lumbosacral region; Chemical diabetes; History of cardiac catheterization; Sinoatrial node dysfunction ([place]); Need for influenza vaccination; Frequency of urination; Seasonal allergies; Breast cancer, right breast ([place]); Chronic pain; Chronic pain syndrome; Chronic low back pain (Location of Primary Source of Pain) (Bilateral) (L>R); Lumbar facet syndrome (Bilateral) (L>R); Lumbar spondylosis; Chronic lumbar radicular pain (Bilateral) (L>R) (L5 [place]erm[place]); Restless leg syndrome; Myofascial pain; Neuropathic pain; Neurogenic pain; Long term current use of opiate analgesic; Long term prescription opiate use; Opiate use (90 MME/Day); Encounter for  therapeutic drug level monitoring; Chronic constipation; Opioid-induced constipation (OIC); Vitamin D insufficiency; Grade 1 Retrolisthesis of L1 over L2; Lumbar facet hypertrophy (L1-2, L2-3, and L4-5); Stenosis of lateral recess of lumbar spine (L1-2 and L4-5); 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); Epidural fibrosis; Lumbar postlaminectomy syndrome; Chronic hip pain (bilateral) (L>R); 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); 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 Her primarily concern today is the Back Pain (lower); Leg Pain (both legs, thighs); and Arm Pain (upper arm  right)  Pain Assessment: Self-Reported Pain Score: 7 /10 Clinically the patient looks like a 3/10 Reported level is inconsistent with clinical observations. Information on the proper use of the pain score provided to the patient today. Pain Type: Chronic pain Pain Location: Back Pain Orientation: Lower Pain Descriptors / Indicators: Aching, Constant, Sharp (sharp pain when getting up and down) Pain Frequency: Constant  [name] was last seen on 03/30/2016 for medication management. During today's appointment we reviewed Kristin [name]'s chronic pain status, as well as her outpatient medication regimen.  The patient  reports that she does not use drugs. Her body mass index is 25.7 kg/m.  Further details on both, my assessment(s), as well as the proposed treatment plan, please see below.  Controlled Substance  Pharmacotherapy Assessment REMS (Risk Evaluation and Mitigation Strategy)  Analgesic:Oxycodone IR 20 mg every 8 hours (60 mg/day) MME/day:90 mg/day  Kristin Slack, RN  06/14/2016 10:39 AM  Sign at close encounter Nursing Pain Medication Assessment:  Safety precautions to be maintained throughout  the outpatient stay will include: orient to surroundings, keep bed in low position, maintain call bell within reach at all times, provide assistance with transfer out of bed and ambulation.  Medication Inspection Compliance: Pill count conducted under aseptic conditions, in front of the patient. Neither the pills nor the bottle was removed from the patient's sight at any time. Once count was completed pills were immediately returned to the patient in their original bottle. Pill Count: 7 of 90 pills remain Bottle Appearance: Standard pharmacy container. Clearly labeled. Medication: Oxycodone IR Filled Date: 09 / 25 / 2017 Pharmacokinetics: Liberation and absorption (onset of action): WNL Distribution (time to peak effect): WNL Metabolism and excretion (duration of action): WNL         Pharmacodynamics: Desired effects: Analgesia: The patient reports >50% benefit. Reported improvement in function: The patient reports medication allows her to accomplish basic ADLs. Clinically meaningful improvement in function (CMIF): Sustained CMIF goals met Perceived effectiveness: Described as relatively effective, allowing for increase in activities of daily living (ADL) Undesirable effects: Side-effects or Adverse reactions: None reported Monitoring: [place] PMP: Online review of the past 59-monthperiod conducted. Compliant with practice rules and regulations List of all UDS test(s) done:  Lab Results  Component Value Date   TOXASSSELUR FINAL 12/18/2015   TOXASSSELUR FINAL 09/24/2015   TOXASSSELUR FINAL 06/25/2015   Last UDS on record: ToxAssure Select 13  Date Value Ref Range Status  12/18/2015 FINAL   Final    Comment:    ==================================================================== TOXASSURE SELECT 13 (MW) ==================================================================== Test                             Result       Flag       Units Drug Present and Declared for Prescription Verification   Oxycodone                      2377         EXPECTED   ng/mg creat   Oxymorphone                    2225         EXPECTED   ng/mg creat   Noroxycodone                   7596         EXPECTED   ng/mg creat   Noroxymorphone                 911          EXPECTED   ng/mg creat    Sources of oxycodone are scheduled prescription medications.    Oxymorphone, noroxycodone, and noroxymorphone are expected    metabolites of oxycodone. Oxymorphone is also available as a    scheduled prescription medication. ==================================================================== Test                      Result    Flag   Units      Ref Range   Creatinine              103              mg/dL      >=20 ==================================================================== Declared Medications:  The flagging and interpretation on this report are based on the  following declared medications.  Unexpected results may arise from  inaccuracies in the declared medications.  **  Note: The testing scope of this panel includes these medications:  Oxycodone  **Note: The testing scope of this panel does not include following  reported medications:  Albuterol  Apixaban  Aspirin  Bisacodyl  Calcium citrate (Calcium citrate/Vitamin D)  Cholecalciferol  Furosemide  Levocetirizine  Lisinopril  Magnesium Oxide  Omeprazole  Oxygen  Ropinirole  Supplement  Vitamin D (Calcium citrate/Vitamin D) ==================================================================== For clinical consultation, please call [phone]. ====================================================================    UDS interpretation:  Compliant          Medication Assessment Form: Reviewed. Patient indicates being compliant with therapy Treatment compliance: Compliant Risk Assessment Profile: Aberrant behavior: See prior evaluations. None observed or detected today Comorbid factors increasing risk of overdose: See prior notes. No additional risks detected today Risk of substance use disorder (SUD): Low Opioid Risk Tool (ORT) Total Score: 3  Interpretation Table:  Score <3 = Low Risk for SUD  Score between 4-7 = Moderate Risk for SUD  Score >8 = High Risk for Opioid Abuse   Risk Mitigation Strategies:  Patient Counseling:  Covered Patient-Prescriber Agreement (PPA): Present and active  Notification to other healthcare providers: Done  Pharmacologic Plan: No change in therapy, at this time  Laboratory Chemistry  Inflammation Markers Lab Results  Component Value Date   ESRSEDRATE 12 09/24/2015   CRP <0.5 09/24/2015   Renal Function Lab Results  Component Value Date   BUN 21 03/02/2016   CREATININE 1.16 (H) 03/02/2016   GFRAA 50 (L) 03/02/2016   GFRNONAA 43 (L) 03/02/2016   Hepatic Function Lab Results  Component Value Date   AST 21 03/02/2016   ALT 10 03/02/2016   ALBUMIN 3.7 03/02/2016   Electrolytes Lab Results  Component Value Date   NA 143 03/02/2016   K 4.6 03/02/2016   CL 105 03/02/2016   CALCIUM 10.0 03/02/2016   MG 2.2 09/24/2015   Pain Modulating Vitamins No results found for: Maralyn Sago [id number], [id number], GO1157WI2, 25OHVITD1, 25OHVITD2, 25OHVITD3, VITAMINB12 Coagulation Parameters Lab Results  Component Value Date   INR 1.24 12/16/2015   LABPROT 15.8 (H) 12/16/2015   APTT 26 12/16/2015   PLT 191 03/02/2016   Cardiovascular Lab Results  Component Value Date   HGB 13.5 03/02/2016   HCT 41.6 03/02/2016   Note: Lab results reviewed.  Recent Diagnostic Imaging Review  Dg Chest 2 View  Result Date: 12/16/2015 CLINICAL DATA:  82 year old female with a history of  preoperative chest x-ray for pacemaker insertion EXAM: CHEST - 2 VIEW COMPARISON:  10/28/2015, 08/27/2014, CT chest 09/07/2014 FINDINGS: Cardiomediastinal silhouette unchanged in size and contour. Atherosclerotic calcifications of the aortic arch. Tortuosity of descending thoracic aorta. Coarsened interstitial markings bilaterally. No confluent airspace disease. Stigmata of emphysema, with increased retrosternal airspace, flattened hemidiaphragms, increased AP diameter, and hyperinflation on the AP view. No pleural effusion or pneumothorax. Surgical changes of the right axilla. No displaced fracture. Multilevel degenerative changes of the spine. Surgical changes of bilateral shoulder reverse arthroplasty. IMPRESSION: Emphysema and chronic lung changes without evidence of superimposed acute cardiopulmonary disease. Atherosclerosis Signed, Dulcy Fanny. Earleen Newport, DO [facility] Electronically Signed   By: Corrie Mckusick D.O.   On: 12/16/2015 10:50   Note: Imaging results reviewed.  Meds  The patient has a current medication list which includes the following prescription(s): albuterol, apixaban, aspirin ec, bisacodyl, calcium citrate-vitamin d, docusate calcium, furosemide, levocetirizine, lisinopril, magnesium oxide, melatonin, omeprazole, oxycodone hcl, oxycodone hcl, oxycodone hcl, oxygen-helium, ropinirole, benefiber drink mix, and meloxicam.  Current Outpatient  Prescriptions on File Prior to Visit  Medication Sig  . albuterol (PROVENTIL) (2.5 MG/3ML) 0.083% nebulizer solution Take 3 mLs (2.5 mg total) by nebulization every 8 (eight) hours as needed. Shortness of breath and wheezing  . apixaban (ELIQUIS) 5 MG TABS tablet Take 1 tablet (5 mg total) by mouth 2 (two) times daily.  Marland Kitchen aspirin EC 325 MG tablet Take by mouth.  . bisacodyl (DULCOLAX) 5 MG EC tablet Take 2 tablets (10 mg total) by mouth daily as needed for moderate constipation.  . calcium  citrate-vitamin D (CITRACAL+D) 315-200 MG-UNIT per tablet Take 1 tablet by mouth 2 (two) times daily.  Marland Kitchen docusate calcium (SURFAK) 240 MG capsule Take 240 mg by mouth.  . furosemide (LASIX) 20 MG tablet Take 1 tablet (20 mg total) by mouth daily.  Marland Kitchen levocetirizine (XYZAL) 5 MG tablet Take 1 tablet (5 mg total) by mouth every evening.  Marland Kitchen lisinopril (PRINIVIL,ZESTRIL) 10 MG tablet Take 1 tablet (10 mg total) by mouth daily.  . magnesium oxide (MAG-OX) 400 MG tablet Take 1 tablet (400 mg total) by mouth daily.  . Melatonin 5 MG TABS Take 5 mg by mouth.  Marland Kitchen omeprazole (PRILOSEC) 20 MG capsule Take 1 capsule (20 mg total) by mouth daily.  . OXYGEN Inhale 2 Doses into the lungs at bedtime.  Marland Kitchen rOPINIRole (REQUIP) 2 MG tablet Take 1 tablet (2 mg total) by mouth at bedtime.   No current facility-administered medications on file prior to visit.    ROS  Constitutional: Denies any fever or chills Gastrointestinal: No reported hemesis, hematochezia, vomiting, or acute GI distress Musculoskeletal: Denies any acute onset joint swelling, redness, loss of ROM, or weakness Neurological: No reported episodes of acute onset apraxia, aphasia, dysarthria, agnosia, amnesia, paralysis, loss of coordination, or loss of consciousness  Allergies  Ms. Mole is allergic to cyclobenzaprine; flexeril [cyclobenzaprine hcl]; and vancomycin.  PFSH  Drug: Ms. Caldwell  reports that she does not use drugs. Alcohol:  reports that she does not drink alcohol. Tobacco:  reports that she quit smoking about 17 years ago. Her smoking use included Cigarettes. She has a 50.00 pack-year smoking history. She has never used smokeless tobacco. Medical:  has a past medical history of Arthritis; Arthritis; Atrial fibrillation ([place]); Back pain; Bradycardia; Cancer [facility]) (2004); CHF (congestive heart failure) ([place]); Chronic back pain; CKD (chronic kidney disease); COPD (chronic obstructive pulmonary disease) ([place]); Cystocele; Decubitus ulcers;  Gout; Hematuria; Hepatitis C; Hyperlipidemia; Hypertension; Hypomagnesemia (06/25/2015); OAB (overactive bladder); Overactive bladder; Restless leg; Urinary frequency; UTI (lower urinary tract infection); and Vaginal atrophy. Family: family history includes Breast cancer in her paternal aunt; Breast cancer (age of onset: 45) in her sister; Cancer in her sister; Heart disease in her father and mother; Kidney disease in her brother; Stroke in her mother.  Past Surgical History:  Procedure Laterality Date  . ABDOMINAL HYSTERECTOMY    . BACK SURGERY    . BREAST SURGERY     rt lumpectomy  . CARPAL TUNNEL RELEASE    . CATARACT EXTRACTION W/PHACO  10/19/2011   Procedure: CATARACT EXTRACTION PHACO AND INTRAOCULAR LENS PLACEMENT (IOC);  Surgeon: Elta Guadeloupe T. Gershon Crane, MD;  Location: [facility];  Service: Ophthalmology;  Laterality: Right;  CDE:10.81  . CATARACT EXTRACTION W/PHACO  11/02/2011   Procedure: CATARACT EXTRACTION PHACO AND INTRAOCULAR LENS PLACEMENT (IOC);  Surgeon: Elta Guadeloupe T. Gershon Crane, MD;  Location: [facility];  Service: Ophthalmology;  Laterality: Left;  CDE 8.60  . COLON SURGERY    . JOINT REPLACEMENT  bilateral TKA  . PACEMAKER INSERTION N/A 12/24/2015   Procedure: INSERTION PACEMAKER;  Surgeon: Isaias Cowman, MD;  Location: [facility] ORS;  Service: Cardiovascular;  Laterality: N/A;  . ROTATOR CUFF REPAIR     bilateral   Constitutional Exam  General appearance: Well nourished, well developed, and well hydrated. In no apparent acute distress Vitals:   06/14/16 1025  BP: (!) 144/67  Pulse: 91  Resp: 16  Temp: 98.2 F (36.8 C)  TempSrc: Oral  SpO2: 94%  Weight: 174 lb (78.9 kg)  Height: 5' 9" (1.753 m)   BMI Assessment: Estimated body mass index is 25.7 kg/m as calculated from the following:   Height as of this encounter: 5' 9" (1.753 m).   Weight as of this encounter: 174 lb (78.9 kg).  BMI interpretation table: BMI level Category Range association with higher incidence of chronic pain   <18 kg/m2 Underweight   18.5-24.9 kg/m2 Ideal body weight   25-29.9 kg/m2 Overweight Increased incidence by 20%  30-34.9 kg/m2 Obese (Class I) Increased incidence by 68%  35-39.9 kg/m2 Severe obesity (Class II) Increased incidence by 136%  >40 kg/m2 Extreme obesity (Class III) Increased incidence by 254%   BMI Readings from Last 4 Encounters:  06/14/16 25.70 kg/m  03/17/16 26.94 kg/m  03/17/16 25.10 kg/m  02/19/16 25.10 kg/m   Wt Readings from Last 4 Encounters:  06/14/16 174 lb (78.9 kg)  03/17/16 172 lb (78 kg)  03/17/16 170 lb (77.1 kg)  02/19/16 170 lb (77.1 kg)  Psych/Mental status: Alert, oriented x 3 (person, place, & time) Eyes: PERLA Respiratory: No evidence of acute respiratory distress  Cervical Spine Exam  Inspection: No masses, redness, or swelling Alignment: Symmetrical Functional ROM: Unrestricted ROM Stability: No instability detected Muscle strength & Tone: Functionally intact Sensory: Unimpaired Palpation: Non-contributory  Upper Extremity (UE) Exam    Side: Right upper extremity  Side: Left upper extremity  Inspection: No masses, redness, swelling, or asymmetry  Inspection: No masses, redness, swelling, or asymmetry  Functional ROM: Decreased ROMfor shoulder joint  Functional ROM: Unrestricted ROM          Muscle strength & Tone: Guarding  Muscle strength & Tone: Functionally intact  Sensory: Articular pain pattern  Sensory: Unimpaired  Palpation: Tender  Palpation: Non-contributory   Thoracic Spine Exam  Inspection: No masses, redness, or swelling Alignment: Symmetrical Functional ROM: Unrestricted ROM Stability: No instability detected Sensory: Unimpaired Muscle strength & Tone: Functionally intact Palpation: Non-contributory  Lumbar Spine Exam  Inspection: Well healed scar from previous spine surgery detected Alignment: Symmetrical Functional ROM: Decreased ROM Stability: No instability detected Muscle strength & Tone: Functionally  intact Sensory: Movement-associated pain Palpation: Complains of area being tender to palpation Provocative Tests: Lumbar Hyperextension and rotation test: Positive bilaterally for facet joint pain. Patrick's Maneuver: evaluation deferred today              Gait & Posture Assessment  Ambulation: Unassisted Gait: Relatively normal for age and body habitus Posture: WNL   Lower Extremity Exam    Side: Right lower extremity  Side: Left lower extremity  Inspection: No masses, redness, swelling, or asymmetry  Inspection: No masses, redness, swelling, or asymmetry  Functional ROM: Unrestricted ROM          Functional ROM: Unrestricted ROM          Muscle strength & Tone: Functionally intact  Muscle strength & Tone: Functionally intact  Sensory: Unimpaired  Sensory: Unimpaired  Palpation: Non-contributory  Palpation: Non-contributory   Assessment  Primary Diagnosis & Pertinent Problem List: The primary encounter diagnosis was Chronic pain syndrome. Diagnoses of Long term current use of opiate analgesic, Opiate use (90 MME/Day), Chronic sacroiliac joint pain (Left), Lumbar facet syndrome (Bilateral) (L>R), Chronic shoulder pain (Right), and Primary osteoarthritis involving multiple joints were also pertinent to this visit.  Visit Diagnosis: 1. Chronic pain syndrome   2. Long term current use of opiate analgesic   3. Opiate use (90 MME/Day)   4. Chronic sacroiliac joint pain (Left)   5. Lumbar facet syndrome (Bilateral) (L>R)   6. Chronic shoulder pain (Right)   7. Primary osteoarthritis involving multiple joints    Plan of Care  Pharmacotherapy (Medications Ordered): Meds ordered this encounter  Medications  . Oxycodone HCl 20 MG TABS    Sig: Take 1 tablet (20 mg total) by mouth every 8 (eight) hours as needed.    Dispense:  90 tablet    Refill:  0    Do not add this medication to the electronic "Automatic Refill" notification system. Patient may have prescription filled one day early  if pharmacy is closed on scheduled refill date. Do not fill until: 06/15/16 To last until: 07/15/16  . Oxycodone HCl 20 MG TABS    Sig: Take 1 tablet (20 mg total) by mouth every 8 (eight) hours as needed.    Dispense:  90 tablet    Refill:  0    Do not add this medication to the electronic "Automatic Refill" notification system. Patient may have prescription filled one day early if pharmacy is closed on scheduled refill date. Do not fill until: 07/15/16 To last until: 08/14/16  . Oxycodone HCl 20 MG TABS    Sig: Take 1 tablet (20 mg total) by mouth every 8 (eight) hours as needed.    Dispense:  90 tablet    Refill:  0    Do not add this medication to the electronic "Automatic Refill" notification system. Patient may have prescription filled one day early if pharmacy is closed on scheduled refill date. Do not fill until: 08/14/16 To last until: 09/13/16  . meloxicam (MOBIC) 7.5 MG tablet    Sig: Take 1 tablet (7.5 mg total) by mouth daily.    Dispense:  30 tablet    Refill:  2    Do not add this medication to the electronic "Automatic Refill" notification system. Patient may have prescription filled one day early if pharmacy is closed on scheduled refill date.   New Prescriptions   MELOXICAM (MOBIC) 7.5 MG TABLET    Take 1 tablet (7.5 mg total) by mouth daily.   Medications administered during this visit: Ms. Parmer had no medications administered during this visit. Lab-work, Procedure(s), & Referral(s) Ordered: Orders Placed This Encounter  Procedures  . LUMBAR FACET(MEDIAL BRANCH NERVE BLOCK) MBNB  . SHOULDER INJECTION   Imaging & Referral(s) Ordered: None  Interventional Therapies: Pending/Scheduled/Planned:   Palliative bilateral lumbar facet block under fluoroscopic guidance and IV sedation, followed by a right intra-articular shoulder joint injection, 2 weeks later.    Considering:   Possible bilateral lumbar facet radiofrequency.   PRN Procedures:   Diagnostic  right intra-articular shoulder joint injection under fluoroscopic guidance, no sedation.  Diagnostic right suprascapular nerve block under fluoroscopic guidance, with or without sedation.  Possible right suprascapular radiofrequency ablation.    Requested PM Follow-up: Return in 3 months (on 09/09/2016) for Med-Mgmt, In addition, Schedule Procedure.  Future Appointments Date Time Provider [place]  06/17/2016 11:15 AM Larene Beach A  McGowan, PA-C [facility] None  06/22/2016 11:00 AM Arlis Porta., MD [facility] None  06/24/2016 12:45 PM Milinda Pointer, MD [facility] None  07/07/2016 9:15 AM Milinda Pointer, MD [facility] None  09/09/2016 9:30 AM Milinda Pointer, MD [facility] None   Primary Care Physician: Dicky Doe, MD Location: [facility] Outpatient Pain Management Facility Note by: Kathlen Brunswick. Dossie Arbour, M.D, DABA, DABAPM, DABPM, DABIPP, FIPP  Pain Score Disclaimer: We use the NRS-11 scale. This is a self-reported, subjective measurement of pain severity with only modest accuracy. It is used primarily to identify changes within a particular patient. It must be understood that outpatient pain scales are significantly less accurate that those used for research, where they can be applied under ideal controlled circumstances with minimal exposure to variables. In reality, the score is likely to be a combination of pain intensity and pain affect, where pain affect describes the degree of emotional arousal or changes in action readiness caused by the sensory experience of pain. Factors such as social and work situation, setting, emotional state, anxiety levels, expectation, and prior pain experience may influence pain perception and show large inter-individual differences that may also be affected by time variables.  Patient instructions provided during this appointment: Patient Instructions   Facet Blocks Patient Information  Description: The facets are joints in the spine between the  vertebrae.  Like any joints in the body, facets can become irritated and painful.  Arthritis can also effect the facets.  By injecting steroids and local anesthetic in and around these joints, we can temporarily block the nerve supply to them.  Steroids act directly on irritated nerves and tissues to reduce selling and inflammation which often leads to decreased pain.  Facet blocks may be done anywhere along the spine from the neck to the low back depending upon the location of your pain.   After numbing the skin with local anesthetic (like Novocaine), a small needle is passed onto the facet joints under x-ray guidance.  You may experience a sensation of pressure while this is being done.  The entire block usually lasts about 15-25 minutes.   Conditions which may be treated by facet blocks:   Low back/buttock pain  Neck/shoulder pain  Certain types of headaches  Preparation for the injection:  1. Do not eat any solid food or dairy products within 8 hours of your appointment. 2. You may drink clear liquid up to 3 hours before appointment.  Clear liquids include water, black coffee, juice or soda.  No milk or cream please. 3. You may take your regular medication, including pain medications, with a sip of water before your appointment.  Diabetics should hold regular insulin (if taken separately) and take 1/2 normal NPH dose the morning of the procedure.  Carry some sugar containing items with you to your appointment. 4. A driver must accompany you and be prepared to drive you home after your procedure. 5. Bring all your current medications with you. 6. An IV may be inserted and sedation may be given at the discretion of the physician. 7. A blood pressure cuff, EKG and other monitors will often be applied during the procedure.  Some patients may need to have extra oxygen administered for a short period. 8. You will be asked to provide medical information, including your allergies and medications,  prior to the procedure.  We must know immediately if you are taking blood thinners (like Coumadin/Warfarin) or if you are allergic to IV iodine contrast (dye).  We must know  if you could possible be pregnant.  Possible side-effects:   Bleeding from needle site  Infection (rare, may require surgery)  Nerve injury (rare)  Numbness & tingling (temporary)  Difficulty urinating (rare, temporary)  Spinal headache (a headache worse with upright posture)  Light-headedness (temporary)  Pain at injection site (serveral days)  Decreased blood pressure (rare, temporary)  Weakness in arm/leg (temporary)  Pressure sensation in back/neck (temporary)   Call if you experience:   Fever/chills associated with headache or increased back/neck pain  Headache worsened by an upright position  New onset, weakness or numbness of an extremity below the injection site  Hives or difficulty breathing (go to the emergency room)  Inflammation or drainage at the injection site(s)  Severe back/neck pain greater than usual  New symptoms which are concerning to you  Please note:  Although the local anesthetic injected can often make your back or neck feel good for several hours after the injection, the pain will likely return. It takes 3-7 days for steroids to work.  You may not notice any pain relief for at least one week.  If effective, we will often do a series of 2-3 injections spaced 3-6 weeks apart to maximally decrease your pain.  After the initial series, you may be a candidate for a more permanent nerve block of the facets.  If you have any questions, please call [phone] [phone]  What are the risk, side effects and possible complications? Generally speaking, most procedures are safe.  However, with any procedure there are risks, side effects, and the possibility of complications.  The risks and complications are  dependent upon the sites that are lesioned, or the type of nerve block to be performed.  The closer the procedure is to the spine, the more serious the risks are.  Great care is taken when placing the radio frequency needles, block needles or lesioning probes, but sometimes complications can occur. 1. Infection: Any time there is an injection through the skin, there is a risk of infection.  This is why sterile conditions are used for these blocks.  There are four possible types of infection. 1. Localized skin infection. 2. Central Nervous System Infection-This can be in the form of Meningitis, which can be deadly. 3. Epidural Infections-This can be in the form of an epidural abscess, which can cause pressure inside of the spine, causing compression of the spinal cord with subsequent paralysis. This would require an emergency surgery to decompress, and there are no guarantees that the patient would recover from the paralysis. 4. Discitis-This is an infection of the intervertebral discs.  It occurs in about 1% of discography procedures.  It is difficult to treat and it may lead to surgery.        2. Pain: the needles have to go through skin and soft tissues, will cause soreness.       3. Damage to internal structures:  The nerves to be lesioned may be near blood vessels or    other nerves which can be potentially damaged.       4. Bleeding: Bleeding is more common if the patient is taking blood thinners such as  aspirin, Coumadin, Ticiid, Plavix, etc., or if he/she have some genetic predisposition  such as hemophilia. Bleeding into the spinal canal can cause compression of the spinal  cord with subsequent paralysis.  This would require an emergency surgery to  decompress and there are no guarantees that  the patient would recover from the  paralysis.       5. Pneumothorax:  Puncturing of a lung is a possibility, every time a needle is introduced in  the area of the chest or upper back.  Pneumothorax refers  to free air around the  collapsed lung(s), inside of the thoracic cavity (chest cavity).  Another two possible  complications related to a similar event would include: Hemothorax and Chylothorax.   These are variations of the Pneumothorax, where instead of air around the collapsed  lung(s), you may have blood or chyle, respectively.       6. Spinal headaches: They may occur with any procedures in the area of the spine.       7. Persistent CSF (Cerebro-Spinal Fluid) leakage: This is a rare problem, but may occur  with prolonged intrathecal or epidural catheters either due to the formation of a fistulous  track or a dural tear.       8. Nerve damage: By working so close to the spinal cord, there is always a possibility of  nerve damage, which could be as serious as a permanent spinal cord injury with  paralysis.       9. Death:  Although rare, severe deadly allergic reactions known as "Anaphylactic  reaction" can occur to any of the medications used.      10. Worsening of the symptoms:  We can always make thing worse.  What are the chances of something like this happening? Chances of any of this occuring are extremely low.  By statistics, you have more of a chance of getting killed in a motor vehicle accident: while driving to the hospital than any of the above occurring .  Nevertheless, you should be aware that they are possibilities.  In general, it is similar to taking a shower.  Everybody knows that you can slip, hit your head and get killed.  Does that mean that you should not shower again?  Nevertheless always keep in mind that statistics do not mean anything if you happen to be on the wrong side of them.  Even if a procedure has a 1 (one) in a 1,000,000 (million) chance of going wrong, it you happen to be that one..Also, keep in mind that by statistics, you have more of a chance of having something go wrong when taking medications.  Who should not have this procedure? If you are on a blood thinning  medication (e.g. Coumadin, Plavix, see list of "Blood Thinners"), or if you have an active infection going on, you should not have the procedure.  If you are taking any blood thinners, please inform your physician.  How should I prepare for this procedure?  Do not eat or drink anything at least six hours prior to the procedure.  Bring a driver with you .  It cannot be a taxi.  Come accompanied by an adult that can drive you back, and that is strong enough to help you if your legs get weak or numb from the local anesthetic.  Take all of your medicines the morning of the procedure with just enough water to swallow them.  If you have diabetes, make sure that you are scheduled to have your procedure done first thing in the morning, whenever possible.  If you have diabetes, take only half of your insulin dose and notify our nurse that you have done so as soon as you arrive at the clinic.  If you are diabetic, but only take blood  sugar pills (oral hypoglycemic), then do not take them on the morning of your procedure.  You may take them after you have had the procedure.  Do not take aspirin or any aspirin-containing medications, at least eleven (11) days prior to the procedure.  They may prolong bleeding.  Wear loose fitting clothing that may be easy to take off and that you would not mind if it got stained with Betadine or blood.  Do not wear any jewelry or perfume  Remove any nail coloring.  It will interfere with some of our monitoring equipment.  NOTE: Remember that this is not meant to be interpreted as a complete list of all possible complications.  Unforeseen problems may occur.  BLOOD THINNERS The following drugs contain aspirin or other products, which can cause increased bleeding during surgery and should not be taken for 2 weeks prior to and 1 week after surgery.  If you should need take something for relief of minor pain, you may take acetaminophen which is found in Tylenol,m  Datril, Anacin-3 and Panadol. It is not blood thinner. The products listed below are.  Do not take any of the products listed below in addition to any listed on your instruction sheet.  A.P.C or A.P.C with Codeine Codeine Phosphate Capsules #3 Ibuprofen Ridaura  ABC compound Congesprin Imuran rimadil  Advil Cope Indocin Robaxisal  Alka-Seltzer Effervescent Pain Reliever and Antacid Coricidin or Coricidin-D  Indomethacin Rufen  Alka-Seltzer plus Cold Medicine Cosprin Ketoprofen S-A-C Tablets  Anacin Analgesic Tablets or Capsules Coumadin Korlgesic Salflex  Anacin Extra Strength Analgesic tablets or capsules CP-2 Tablets Lanoril Salicylate  Anaprox Cuprimine Capsules Levenox Salocol  Anexsia-D Dalteparin Magan Salsalate  Anodynos Darvon compound Magnesium Salicylate Sine-off  Ansaid Dasin Capsules Magsal Sodium Salicylate  Anturane Depen Capsules Marnal Soma  APF Arthritis pain formula Dewitt's Pills Measurin Stanback  Argesic Dia-Gesic Meclofenamic Sulfinpyrazone  Arthritis Bayer Timed Release Aspirin Diclofenac Meclomen Sulindac  Arthritis pain formula Anacin Dicumarol Medipren Supac  Analgesic (Safety coated) Arthralgen Diffunasal Mefanamic Suprofen  Arthritis Strength Bufferin Dihydrocodeine Mepro Compound Suprol  Arthropan liquid Dopirydamole Methcarbomol with Aspirin Synalgos  ASA tablets/Enseals Disalcid Micrainin Tagament  Ascriptin Doan's Midol Talwin  Ascriptin A/D Dolene Mobidin Tanderil  Ascriptin Extra Strength Dolobid Moblgesic Ticlid  Ascriptin with Codeine Doloprin or Doloprin with Codeine Momentum Tolectin  Asperbuf Duoprin Mono-gesic Trendar  Aspergum Duradyne Motrin or Motrin IB Triminicin  Aspirin plain, buffered or enteric coated Durasal Myochrisine Trigesic  Aspirin Suppositories Easprin Nalfon Trillsate  Aspirin with Codeine Ecotrin Regular or Extra Strength Naprosyn Uracel  Atromid-S Efficin Naproxen Ursinus  Auranofin Capsules Elmiron Neocylate Vanquish   Axotal Emagrin Norgesic Verin  Azathioprine Empirin or Empirin with Codeine Normiflo Vitamin E  Azolid Emprazil Nuprin Voltaren  Bayer Aspirin plain, buffered or children's or timed BC Tablets or powders Encaprin Orgaran Warfarin Sodium  Buff-a-Comp Enoxaparin Orudis Zorpin  Buff-a-Comp with Codeine Equegesic Os-Cal-Gesic   Buffaprin Excedrin plain, buffered or Extra Strength Oxalid   Bufferin Arthritis Strength Feldene Oxphenbutazone   Bufferin plain or Extra Strength Feldene Capsules Oxycodone with Aspirin   Bufferin with Codeine Fenoprofen Fenoprofen Pabalate or Pabalate-SF   Buffets II Flogesic Panagesic   Buffinol plain or Extra Strength Florinal or Florinal with Codeine Panwarfarin   Buf-Tabs Flurbiprofen Penicillamine   Butalbital Compound Four-way cold tablets Penicillin   Butazolidin Fragmin Pepto-Bismol   Carbenicillin Geminisyn Percodan   Carna Arthritis Reliever Geopen Persantine   Carprofen Gold's salt Persistin   Chloramphenicol Goody's Phenylbutazone   Chloromycetin Haltrain  Piroxlcam   Clmetidine heparin Plaquenil   Cllnoril Hyco-pap Ponstel   Clofibrate Hydroxy chloroquine Propoxyphen         Before stopping any of these medications, be sure to consult the physician who ordered them.  Some, such as Coumadin (Warfarin) are ordered to prevent or treat serious conditions such as "deep thrombosis", "pumonary embolisms", and other heart problems.  The amount of time that you may need off of the medication may also vary with the medication and the reason for which you were taking it.  If you are taking any of these medications, please make sure you notify your pain physician before you undergo any procedures.

## 2016-06-14 NOTE — Progress Notes (Deleted)
Nursing Pain Medication Assessment:  Safety precautions to be maintained throughout the outpatient stay will include: orient to surroundings, keep bed in low position, maintain call bell within reach at all times, provide assistance with transfer out of bed and ambulation.  Medication Inspection Compliance: Pill count conducted under aseptic conditions, in front of the patient. Neither the pills nor the bottle was removed from the patient's sight at any time. Once count was completed pills were immediately returned to the patient in their original bottle. Pill Count: *** of *** pills remain Bottle Appearance: Standard pharmacy container. Clearly labeled. Medication: See above Filled Date: 32 / *** / 2017 Medication last intake: ***

## 2016-06-14 NOTE — Progress Notes (Signed)
Nursing Pain Medication Assessment:  Safety precautions to be maintained throughout the outpatient stay will include: orient to surroundings, keep bed in low position, maintain call bell within reach at all times, provide assistance with transfer out of bed and ambulation.  Medication Inspection Compliance: Pill count conducted under aseptic conditions, in front of the patient. Neither the pills nor the bottle was removed from the patient's sight at any time. Once count was completed pills were immediately returned to the patient in their original bottle. Pill Count: 7 of 90 pills remain Bottle Appearance: Standard pharmacy container. Clearly labeled. Medication: Oxycodone IR Filled Date: 09 / 25 / 2017

## 2016-06-17 ENCOUNTER — Ambulatory Visit: Payer: Medicare Other | Admitting: Urology

## 2016-06-22 ENCOUNTER — Ambulatory Visit (INDEPENDENT_AMBULATORY_CARE_PROVIDER_SITE_OTHER): Payer: Medicare Other | Admitting: Family Medicine

## 2016-06-22 ENCOUNTER — Encounter: Payer: Self-pay | Admitting: Family Medicine

## 2016-06-22 VITALS — BP 140/80 | HR 80 | Temp 98.1°F | Resp 16 | Ht 67.0 in | Wt 174.0 lb

## 2016-06-22 DIAGNOSIS — Z23 Encounter for immunization: Secondary | ICD-10-CM

## 2016-06-22 DIAGNOSIS — M1288 Other specific arthropathies, not elsewhere classified, other specified site: Secondary | ICD-10-CM

## 2016-06-22 DIAGNOSIS — I5032 Chronic diastolic (congestive) heart failure: Secondary | ICD-10-CM | POA: Diagnosis not present

## 2016-06-22 DIAGNOSIS — N183 Chronic kidney disease, stage 3 unspecified: Secondary | ICD-10-CM

## 2016-06-22 DIAGNOSIS — I10 Essential (primary) hypertension: Secondary | ICD-10-CM

## 2016-06-22 DIAGNOSIS — I495 Sick sinus syndrome: Secondary | ICD-10-CM | POA: Diagnosis not present

## 2016-06-22 DIAGNOSIS — J431 Panlobular emphysema: Secondary | ICD-10-CM

## 2016-06-22 DIAGNOSIS — M47816 Spondylosis without myelopathy or radiculopathy, lumbar region: Secondary | ICD-10-CM

## 2016-06-22 DIAGNOSIS — G2581 Restless legs syndrome: Secondary | ICD-10-CM

## 2016-06-22 DIAGNOSIS — J301 Allergic rhinitis due to pollen: Secondary | ICD-10-CM | POA: Diagnosis not present

## 2016-06-22 MED ORDER — LEVOCETIRIZINE DIHYDROCHLORIDE 5 MG PO TABS: 5.0000 mg | ORAL_TABLET | Freq: Every evening | ORAL | 12 refills | Status: DC

## 2016-06-22 NOTE — Progress Notes (Signed)
Name: Kristin [name]   MRN: [id number]    DOB: [birth year]   Date:06/22/2016       Progress Note  Subjective  Chief Complaint  Chief Complaint  Patient presents with  . Hypertension    HPI Here for f/u of HBP.  She has CKD grade 3 and low back pain, seasonal allergies.  No problem-specific Assessment & Plan notes found for this encounter.   Past Medical History:  Diagnosis Date  . Arthritis   . Arthritis   . Atrial fibrillation ([place])   . Back pain    lower back chronic  . Bradycardia   . Cancer [facility]) 2004   rt breast cancer-post lumpectomy- chemo/rad  . CHF (congestive heart failure) ([place])   . Chronic back pain   . CKD (chronic kidney disease)   . COPD (chronic obstructive pulmonary disease) (HCC)    wears O2 at 2L via [place] at night  . Cystocele   . Decubitus ulcers   . Gout   . Hematuria   . Hepatitis C   . Hyperlipidemia   . Hypertension   . Hypomagnesemia 06/25/2015  . OAB (overactive bladder)   . Overactive bladder   . Restless leg   . Urinary frequency   . UTI (lower urinary tract infection)   . Vaginal atrophy     Past Surgical History:  Procedure Laterality Date  . ABDOMINAL HYSTERECTOMY    . BACK SURGERY    . BREAST SURGERY     rt lumpectomy  . CARPAL TUNNEL RELEASE    . CATARACT EXTRACTION W/PHACO  10/19/2011   Procedure: CATARACT EXTRACTION PHACO AND INTRAOCULAR LENS PLACEMENT (IOC);  Surgeon: Elta Guadeloupe T. Gershon Crane, MD;  Location: [facility];  Service: Ophthalmology;  Laterality: Right;  CDE:10.81  . CATARACT EXTRACTION W/PHACO  11/02/2011   Procedure: CATARACT EXTRACTION PHACO AND INTRAOCULAR LENS PLACEMENT (IOC);  Surgeon: Elta Guadeloupe T. Gershon Crane, MD;  Location: [facility];  Service: Ophthalmology;  Laterality: Left;  CDE 8.60  . COLON SURGERY    . JOINT REPLACEMENT     bilateral TKA  . PACEMAKER INSERTION N/A 12/24/2015   Procedure: INSERTION PACEMAKER;  Surgeon: Isaias Cowman, MD;  Location: [facility] ORS;  Service: Cardiovascular;  Laterality: N/A;  . ROTATOR CUFF  REPAIR     bilateral    Family History  Problem Relation Age of Onset  . Kidney disease Brother   . Heart disease Father   . Heart disease Mother   . Stroke Mother   . Cancer Sister   . Breast cancer Sister 57  . Breast cancer Paternal Aunt   . Anesthesia problems Neg Hx   . Hypotension Neg Hx   . Malignant hyperthermia Neg Hx   . Pseudochol deficiency Neg Hx     Social History   Social History  . Marital status: Single    Spouse name: N/A  . Number of children: N/A  . Years of education: N/A   Occupational History  . retired    Social History Main Topics  . Smoking status: Former Smoker    Packs/day: 1.25    Years: 40.00    Types: Cigarettes    Quit date: 12/16/1998  . Smokeless tobacco: Never Used  . Alcohol use No  . Drug use: No  . Sexual activity: Yes    Birth control/ protection: Surgical   Other Topics Concern  . Not on file   Social History Narrative  . No narrative on file     Current Outpatient Prescriptions:  .  albuterol (PROVENTIL) (2.5 MG/3ML) 0.083% nebulizer solution, Take 3 mLs (2.5 mg total) by nebulization every 8 (eight) hours as needed. Shortness of breath and wheezing, Disp: 75 mL, Rfl: 12 .  apixaban (ELIQUIS) 5 MG TABS tablet, Take 1 tablet (5 mg total) by mouth 2 (two) times daily., Disp: 60 tablet, Rfl: 0 .  aspirin EC 325 MG tablet, Take by mouth., Disp: , Rfl:  .  bisacodyl (DULCOLAX) 5 MG EC tablet, Take 2 tablets (10 mg total) by mouth daily as needed for moderate constipation., Disp: 60 tablet, Rfl: 2 .  calcium citrate-vitamin D (CITRACAL+D) 315-200 MG-UNIT per tablet, Take 1 tablet by mouth 2 (two) times daily., Disp: , Rfl:  .  docusate calcium (SURFAK) 240 MG capsule, Take 240 mg by mouth., Disp: , Rfl:  .  furosemide (LASIX) 20 MG tablet, Take 1 tablet (20 mg total) by mouth daily., Disp: 90 tablet, Rfl: 3 .  levocetirizine (XYZAL) 5 MG tablet, Take 1 tablet (5 mg total) by mouth every evening., Disp: 30 tablet, Rfl: 12 .   lisinopril (PRINIVIL,ZESTRIL) 10 MG tablet, Take 1 tablet (10 mg total) by mouth daily., Disp: 90 tablet, Rfl: 3 .  magnesium oxide (MAG-OX) 400 MG tablet, Take 1 tablet (400 mg total) by mouth daily., Disp: 30 tablet, Rfl: 2 .  meloxicam (MOBIC) 7.5 MG tablet, Take 1 tablet (7.5 mg total) by mouth daily., Disp: 30 tablet, Rfl: 2 .  omeprazole (PRILOSEC) 20 MG capsule, Take 1 capsule (20 mg total) by mouth daily., Disp: 90 capsule, Rfl: 3 .  Oxycodone HCl 20 MG TABS, Take 1 tablet (20 mg total) by mouth every 8 (eight) hours as needed., Disp: 90 tablet, Rfl: 0 .  [START ON 07/15/2016] Oxycodone HCl 20 MG TABS, Take 1 tablet (20 mg total) by mouth every 8 (eight) hours as needed., Disp: 90 tablet, Rfl: 0 .  [START ON 08/14/2016] Oxycodone HCl 20 MG TABS, Take 1 tablet (20 mg total) by mouth every 8 (eight) hours as needed., Disp: 90 tablet, Rfl: 0 .  OXYGEN, Inhale 2 Doses into the lungs at bedtime., Disp: , Rfl:  .  rOPINIRole (REQUIP) 2 MG tablet, Take 1 tablet (2 mg total) by mouth at bedtime., Disp: 30 tablet, Rfl: 2 .  Wheat Dextrin (BENEFIBER DRINK MIX) PACK, Stir 2 teaspoons of Benefiber into 4-8 oz of any non-carbonated beverage or soft food (hot or cold) PO TID as needed, Disp: , Rfl:   Allergies  Allergen Reactions  . Cyclobenzaprine Rash and Hives    Severe Rash  . Flexeril [Cyclobenzaprine Hcl]     Severe Rash  . Vancomycin Rash    [place] Syndrome     Review of Systems  Constitutional: Negative for chills, fever, malaise/fatigue and weight loss.  HENT: Positive for congestion. Negative for hearing loss.   Eyes: Negative for blurred vision and double vision.  Respiratory: Negative for cough, shortness of breath and wheezing.   Cardiovascular: Negative for chest pain, palpitations and leg swelling.  Gastrointestinal: Negative for abdominal pain, blood in stool and heartburn.  Genitourinary: Positive for frequency and urgency. Negative for dysuria.  Musculoskeletal: Positive  for back pain and joint pain. Negative for myalgias.  Skin: Negative for rash.  Neurological: Negative for dizziness, tremors, weakness and headaches.      Objective  Vitals:   06/22/16 1052 06/22/16 1139  BP: (!) 147/92 140/80  Pulse: 80   Resp: 16   Temp: 98.1 F (36.7 C)   TempSrc: Oral  Weight: 174 lb (78.9 kg)   Height: '5\' 7"'$  (1.702 m)     Physical Exam  Constitutional: She is oriented to person, place, and time and well-developed, well-nourished, and in no distress. No distress.  HENT:  Head: Normocephalic and atraumatic.  Eyes: Conjunctivae and EOM are normal. Pupils are equal, round, and reactive to light. No scleral icterus.  Neck: Normal range of motion. Neck supple. Carotid bruit is not present. No thyromegaly present.  Cardiovascular: Normal rate and regular rhythm.  Exam reveals no gallop and no friction rub.   Murmur heard.  Systolic murmur is present with a grade of 2/6  throughout  Pulmonary/Chest: Effort normal. No respiratory distress. She has no wheezes. She has rales (faint rales at bilateral bases).  Musculoskeletal: She exhibits no edema.  Lymphadenopathy:    She has no cervical adenopathy.  Neurological: She is alert and oriented to person, place, and time.  Vitals reviewed.      No results found for this or any previous visit (from the past 2160 hour(s)).   Assessment & Plan  Problem List Items Addressed This Visit      Cardiovascular and Mediastinum   Chronic diastolic heart failure (HCC)   Benign essential HTN   Sick sinus syndrome (HCC)     Respiratory   Chronic obstructive pulmonary disease (HCC)   Relevant Medications   levocetirizine (XYZAL) 5 MG tablet   Seasonal allergies   Relevant Medications   levocetirizine (XYZAL) 5 MG tablet     Musculoskeletal and Integument   Lumbar facet syndrome (Bilateral) (L>R) (Chronic)     Genitourinary   Chronic kidney disease (CKD), stage III (moderate)     Other   Restless leg  syndrome (Chronic)    Other Visit Diagnoses    Need for vaccination    -  Primary   Relevant Orders   Flu vaccine HIGH DOSE PF (Fluzone High dose) (Completed)      Meds ordered this encounter  Medications  . levocetirizine (XYZAL) 5 MG tablet    Sig: Take 1 tablet (5 mg total) by mouth every evening.    Dispense:  30 tablet    Refill:  12   1. Need for vaccination  - Flu vaccine HIGH DOSE PF (Fluzone High dose)  2. Benign essential HTN   3. Chronic diastolic heart failure (HCC) Cont to see Dr. Lillie Fragmin  4. Sick sinus syndrome ([place])   5. Panlobular emphysema (HCC)   6. Lumbar facet syndrome (Bilateral) (L>R)   7. Chronic kidney disease (CKD), stage III (moderate)   8. Restless leg syndrome   9. Chronic seasonal allergic rhinitis due to pollen  - levocetirizine (XYZAL) 5 MG tablet; Take 1 tablet (5 mg total) by mouth every evening.  Dispense: 30 tablet; Refill: 12   Continue all current meds at current doses

## 2016-06-24 ENCOUNTER — Ambulatory Visit: Payer: Medicare Other | Admitting: Pain Medicine

## 2016-06-25 ENCOUNTER — Telehealth: Payer: Self-pay | Admitting: Pain Medicine

## 2016-06-25 NOTE — Telephone Encounter (Signed)
Patient advised no prescriptions are written outside of appointment.

## 2016-06-25 NOTE — Telephone Encounter (Signed)
Calling about a refill for ReQuip, not sure if dr Dossie Arbour writes this for her, please call

## 2016-06-29 ENCOUNTER — Ambulatory Visit: Payer: Medicare Other | Attending: Pain Medicine | Admitting: Pain Medicine

## 2016-06-29 ENCOUNTER — Encounter: Payer: Self-pay | Admitting: Pain Medicine

## 2016-06-29 ENCOUNTER — Telehealth: Payer: Self-pay | Admitting: Pain Medicine

## 2016-06-29 VITALS — BP 150/83 | HR 81 | Temp 98.2°F | Resp 16 | Ht 69.0 in | Wt 174.0 lb

## 2016-06-29 DIAGNOSIS — I130 Hypertensive heart and chronic kidney disease with heart failure and stage 1 through stage 4 chronic kidney disease, or unspecified chronic kidney disease: Secondary | ICD-10-CM | POA: Insufficient documentation

## 2016-06-29 DIAGNOSIS — N3281 Overactive bladder: Secondary | ICD-10-CM | POA: Insufficient documentation

## 2016-06-29 DIAGNOSIS — I341 Nonrheumatic mitral (valve) prolapse: Secondary | ICD-10-CM | POA: Diagnosis not present

## 2016-06-29 DIAGNOSIS — I5032 Chronic diastolic (congestive) heart failure: Secondary | ICD-10-CM | POA: Diagnosis not present

## 2016-06-29 DIAGNOSIS — I495 Sick sinus syndrome: Secondary | ICD-10-CM | POA: Diagnosis not present

## 2016-06-29 DIAGNOSIS — M545 Low back pain: Secondary | ICD-10-CM | POA: Diagnosis not present

## 2016-06-29 DIAGNOSIS — M25511 Pain in right shoulder: Secondary | ICD-10-CM | POA: Insufficient documentation

## 2016-06-29 DIAGNOSIS — E785 Hyperlipidemia, unspecified: Secondary | ICD-10-CM | POA: Diagnosis not present

## 2016-06-29 DIAGNOSIS — K5909 Other constipation: Secondary | ICD-10-CM | POA: Diagnosis not present

## 2016-06-29 DIAGNOSIS — I4891 Unspecified atrial fibrillation: Secondary | ICD-10-CM | POA: Insufficient documentation

## 2016-06-29 DIAGNOSIS — Z950 Presence of cardiac pacemaker: Secondary | ICD-10-CM | POA: Insufficient documentation

## 2016-06-29 DIAGNOSIS — G2581 Restless legs syndrome: Secondary | ICD-10-CM | POA: Diagnosis not present

## 2016-06-29 DIAGNOSIS — M75102 Unspecified rotator cuff tear or rupture of left shoulder, not specified as traumatic: Secondary | ICD-10-CM | POA: Diagnosis not present

## 2016-06-29 DIAGNOSIS — R55 Syncope and collapse: Secondary | ICD-10-CM | POA: Diagnosis not present

## 2016-06-29 DIAGNOSIS — Z87891 Personal history of nicotine dependence: Secondary | ICD-10-CM | POA: Diagnosis not present

## 2016-06-29 DIAGNOSIS — K219 Gastro-esophageal reflux disease without esophagitis: Secondary | ICD-10-CM | POA: Insufficient documentation

## 2016-06-29 DIAGNOSIS — Z79899 Other long term (current) drug therapy: Secondary | ICD-10-CM | POA: Diagnosis not present

## 2016-06-29 DIAGNOSIS — R7309 Other abnormal glucose: Secondary | ICD-10-CM | POA: Diagnosis not present

## 2016-06-29 DIAGNOSIS — M48061 Spinal stenosis, lumbar region without neurogenic claudication: Secondary | ICD-10-CM | POA: Diagnosis not present

## 2016-06-29 DIAGNOSIS — Z7901 Long term (current) use of anticoagulants: Secondary | ICD-10-CM | POA: Insufficient documentation

## 2016-06-29 DIAGNOSIS — I4892 Unspecified atrial flutter: Secondary | ICD-10-CM | POA: Insufficient documentation

## 2016-06-29 DIAGNOSIS — N183 Chronic kidney disease, stage 3 (moderate): Secondary | ICD-10-CM | POA: Diagnosis not present

## 2016-06-29 DIAGNOSIS — J449 Chronic obstructive pulmonary disease, unspecified: Secondary | ICD-10-CM | POA: Diagnosis not present

## 2016-06-29 DIAGNOSIS — M109 Gout, unspecified: Secondary | ICD-10-CM | POA: Insufficient documentation

## 2016-06-29 DIAGNOSIS — Z9889 Other specified postprocedural states: Secondary | ICD-10-CM | POA: Insufficient documentation

## 2016-06-29 MED ORDER — ROPINIROLE HCL 2 MG PO TABS: 2.0000 mg | ORAL_TABLET | Freq: Every day | ORAL | 0 refills | Status: DC

## 2016-06-29 NOTE — Patient Instructions (Signed)
GENERAL RISKS AND COMPLICATIONS  What are the risk, side effects and possible complications? Generally speaking, most procedures are safe.  However, with any procedure there are risks, side effects, and the possibility of complications.  The risks and complications are dependent upon the sites that are lesioned, or the type of nerve block to be performed.  The closer the procedure is to the spine, the more serious the risks are.  Great care is taken when placing the radio frequency needles, block needles or lesioning probes, but sometimes complications can occur. 1. Infection: Any time there is an injection through the skin, there is a risk of infection.  This is why sterile conditions are used for these blocks.  There are four possible types of infection. 1. Localized skin infection. 2. Central Nervous System Infection-This can be in the form of Meningitis, which can be deadly. 3. Epidural Infections-This can be in the form of an epidural abscess, which can cause pressure inside of the spine, causing compression of the spinal cord with subsequent paralysis. This would require an emergency surgery to decompress, and there are no guarantees that the patient would recover from the paralysis. 4. Discitis-This is an infection of the intervertebral discs.  It occurs in about 1% of discography procedures.  It is difficult to treat and it may lead to surgery.        2. Pain: the needles have to go through skin and soft tissues, will cause soreness.       3. Damage to internal structures:  The nerves to be lesioned may be near blood vessels or    other nerves which can be potentially damaged.       4. Bleeding: Bleeding is more common if the patient is taking blood thinners such as  aspirin, Coumadin, Ticiid, Plavix, etc., or if he/she have some genetic predisposition  such as hemophilia. Bleeding into the spinal canal can cause compression of the spinal  cord with subsequent paralysis.  This would require an  emergency surgery to  decompress and there are no guarantees that the patient would recover from the  paralysis.       5. Pneumothorax:  Puncturing of a lung is a possibility, every time a needle is introduced in  the area of the chest or upper back.  Pneumothorax refers to free air around the  collapsed lung(s), inside of the thoracic cavity (chest cavity).  Another two possible  complications related to a similar event would include: Hemothorax and Chylothorax.   These are variations of the Pneumothorax, where instead of air around the collapsed  lung(s), you may have blood or chyle, respectively.       6. Spinal headaches: They may occur with any procedures in the area of the spine.       7. Persistent CSF (Cerebro-Spinal Fluid) leakage: This is a rare problem, but may occur  with prolonged intrathecal or epidural catheters either due to the formation of a fistulous  track or a dural tear.       8. Nerve damage: By working so close to the spinal cord, there is always a possibility of  nerve damage, which could be as serious as a permanent spinal cord injury with  paralysis.       9. Death:  Although rare, severe deadly allergic reactions known as "Anaphylactic  reaction" can occur to any of the medications used.      10. Worsening of the symptoms:  We can always make thing worse.    What are the chances of something like this happening? Chances of any of this occuring are extremely low.  By statistics, you have more of a chance of getting killed in a motor vehicle accident: while driving to the hospital than any of the above occurring .  Nevertheless, you should be aware that they are possibilities.  In general, it is similar to taking a shower.  Everybody knows that you can slip, hit your head and get killed.  Does that mean that you should not shower again?  Nevertheless always keep in mind that statistics do not mean anything if you happen to be on the wrong side of them.  Even if a procedure has a 1  (one) in a 1,000,000 (million) chance of going wrong, it you happen to be that one..Also, keep in mind that by statistics, you have more of a chance of having something go wrong when taking medications.  Who should not have this procedure? If you are on a blood thinning medication (e.g. Coumadin, Plavix, see list of "Blood Thinners"), or if you have an active infection going on, you should not have the procedure.  If you are taking any blood thinners, please inform your physician.  How should I prepare for this procedure?  Do not eat or drink anything at least six hours prior to the procedure.  Bring a driver with you .  It cannot be a taxi.  Come accompanied by an adult that can drive you back, and that is strong enough to help you if your legs get weak or numb from the local anesthetic.  Take all of your medicines the morning of the procedure with just enough water to swallow them.  If you have diabetes, make sure that you are scheduled to have your procedure done first thing in the morning, whenever possible.  If you have diabetes, take only half of your insulin dose and notify our nurse that you have done so as soon as you arrive at the clinic.  If you are diabetic, but only take blood sugar pills (oral hypoglycemic), then do not take them on the morning of your procedure.  You may take them after you have had the procedure.  Do not take aspirin or any aspirin-containing medications, at least eleven (11) days prior to the procedure.  They may prolong bleeding.  Wear loose fitting clothing that may be easy to take off and that you would not mind if it got stained with Betadine or blood.  Do not wear any jewelry or perfume  Remove any nail coloring.  It will interfere with some of our monitoring equipment.  NOTE: Remember that this is not meant to be interpreted as a complete list of all possible complications.  Unforeseen problems may occur.  BLOOD THINNERS The following drugs  contain aspirin or other products, which can cause increased bleeding during surgery and should not be taken for 2 weeks prior to and 1 week after surgery.  If you should need take something for relief of minor pain, you may take acetaminophen which is found in Tylenol,m Datril, Anacin-3 and Panadol. It is not blood thinner. The products listed below are.  Do not take any of the products listed below in addition to any listed on your instruction sheet.  A.P.C or A.P.C with Codeine Codeine Phosphate Capsules #3 Ibuprofen Ridaura  ABC compound Congesprin Imuran rimadil  Advil Cope Indocin Robaxisal  Alka-Seltzer Effervescent Pain Reliever and Antacid Coricidin or Coricidin-D  Indomethacin Rufen    Alka-Seltzer plus Cold Medicine Cosprin Ketoprofen S-A-C Tablets  Anacin Analgesic Tablets or Capsules Coumadin Korlgesic Salflex  Anacin Extra Strength Analgesic tablets or capsules CP-2 Tablets Lanoril Salicylate  Anaprox Cuprimine Capsules Levenox Salocol  Anexsia-D Dalteparin Magan Salsalate  Anodynos Darvon compound Magnesium Salicylate Sine-off  Ansaid Dasin Capsules Magsal Sodium Salicylate  Anturane Depen Capsules Marnal Soma  APF Arthritis pain formula Dewitt's Pills Measurin Stanback  Argesic Dia-Gesic Meclofenamic Sulfinpyrazone  Arthritis Bayer Timed Release Aspirin Diclofenac Meclomen Sulindac  Arthritis pain formula Anacin Dicumarol Medipren Supac  Analgesic (Safety coated) Arthralgen Diffunasal Mefanamic Suprofen  Arthritis Strength Bufferin Dihydrocodeine Mepro Compound Suprol  Arthropan liquid Dopirydamole Methcarbomol with Aspirin Synalgos  ASA tablets/Enseals Disalcid Micrainin Tagament  Ascriptin Doan's Midol Talwin  Ascriptin A/D Dolene Mobidin Tanderil  Ascriptin Extra Strength Dolobid Moblgesic Ticlid  Ascriptin with Codeine Doloprin or Doloprin with Codeine Momentum Tolectin  Asperbuf Duoprin Mono-gesic Trendar  Aspergum Duradyne Motrin or Motrin IB Triminicin  Aspirin  plain, buffered or enteric coated Durasal Myochrisine Trigesic  Aspirin Suppositories Easprin Nalfon Trillsate  Aspirin with Codeine Ecotrin Regular or Extra Strength Naprosyn Uracel  Atromid-S Efficin Naproxen Ursinus  Auranofin Capsules Elmiron Neocylate Vanquish  Axotal Emagrin Norgesic Verin  Azathioprine Empirin or Empirin with Codeine Normiflo Vitamin E  Azolid Emprazil Nuprin Voltaren  Bayer Aspirin plain, buffered or children's or timed BC Tablets or powders Encaprin Orgaran Warfarin Sodium  Buff-a-Comp Enoxaparin Orudis Zorpin  Buff-a-Comp with Codeine Equegesic Os-Cal-Gesic   Buffaprin Excedrin plain, buffered or Extra Strength Oxalid   Bufferin Arthritis Strength Feldene Oxphenbutazone   Bufferin plain or Extra Strength Feldene Capsules Oxycodone with Aspirin   Bufferin with Codeine Fenoprofen Fenoprofen Pabalate or Pabalate-SF   Buffets II Flogesic Panagesic   Buffinol plain or Extra Strength Florinal or Florinal with Codeine Panwarfarin   Buf-Tabs Flurbiprofen Penicillamine   Butalbital Compound Four-way cold tablets Penicillin   Butazolidin Fragmin Pepto-Bismol   Carbenicillin Geminisyn Percodan   Carna Arthritis Reliever Geopen Persantine   Carprofen Gold's salt Persistin   Chloramphenicol Goody's Phenylbutazone   Chloromycetin Haltrain Piroxlcam   Clmetidine heparin Plaquenil   Cllnoril Hyco-pap Ponstel   Clofibrate Hydroxy chloroquine Propoxyphen         Before stopping any of these medications, be sure to consult the physician who ordered them.  Some, such as Coumadin (Warfarin) are ordered to prevent or treat serious conditions such as "deep thrombosis", "pumonary embolisms", and other heart problems.  The amount of time that you may need off of the medication may also vary with the medication and the reason for which you were taking it.  If you are taking any of these medications, please make sure you notify your pain physician before you undergo any  procedures.         Facet Blocks Patient Information  Description: The facets are joints in the spine between the vertebrae.  Like any joints in the body, facets can become irritated and painful.  Arthritis can also effect the facets.  By injecting steroids and local anesthetic in and around these joints, we can temporarily block the nerve supply to them.  Steroids act directly on irritated nerves and tissues to reduce selling and inflammation which often leads to decreased pain.  Facet blocks may be done anywhere along the spine from the neck to the low back depending upon the location of your pain.   After numbing the skin with local anesthetic (like Novocaine), a small needle is passed onto the facet joints under x-ray guidance.    You may experience a sensation of pressure while this is being done.  The entire block usually lasts about 15-25 minutes.   Conditions which may be treated by facet blocks:   Low back/buttock pain  Neck/shoulder pain  Certain types of headaches  Preparation for the injection:  1. Do not eat any solid food or dairy products within 8 hours of your appointment. 2. You may drink clear liquid up to 3 hours before appointment.  Clear liquids include water, black coffee, juice or soda.  No milk or cream please. 3. You may take your regular medication, including pain medications, with a sip of water before your appointment.  Diabetics should hold regular insulin (if taken separately) and take 1/2 normal NPH dose the morning of the procedure.  Carry some sugar containing items with you to your appointment. 4. A driver must accompany you and be prepared to drive you home after your procedure. 5. Bring all your current medications with you. 6. An IV may be inserted and sedation may be given at the discretion of the physician. 7. A blood pressure cuff, EKG and other monitors will often be applied during the procedure.  Some patients may need to have extra oxygen  administered for a short period. 8. You will be asked to provide medical information, including your allergies and medications, prior to the procedure.  We must know immediately if you are taking blood thinners (like Coumadin/Warfarin) or if you are allergic to IV iodine contrast (dye).  We must know if you could possible be pregnant.  Possible side-effects:   Bleeding from needle site  Infection (rare, may require surgery)  Nerve injury (rare)  Numbness & tingling (temporary)  Difficulty urinating (rare, temporary)  Spinal headache (a headache worse with upright posture)  Light-headedness (temporary)  Pain at injection site (serveral days)  Decreased blood pressure (rare, temporary)  Weakness in arm/leg (temporary)  Pressure sensation in back/neck (temporary)   Call if you experience:   Fever/chills associated with headache or increased back/neck pain  Headache worsened by an upright position  New onset, weakness or numbness of an extremity below the injection site  Hives or difficulty breathing (go to the emergency room)  Inflammation or drainage at the injection site(s)  Severe back/neck pain greater than usual  New symptoms which are concerning to you  Please note:  Although the local anesthetic injected can often make your back or neck feel good for several hours after the injection, the pain will likely return. It takes 3-7 days for steroids to work.  You may not notice any pain relief for at least one week.  If effective, we will often do a series of 2-3 injections spaced 3-6 weeks apart to maximally decrease your pain.  After the initial series, you may be a candidate for a more permanent nerve block of the facets.  If you have any questions, please call [phone]  [facility] 

## 2016-06-29 NOTE — Telephone Encounter (Signed)
Patient came in to inquire about medications, we gave her an appt that prior patient canceled and she was seen today

## 2016-06-29 NOTE — Progress Notes (Signed)
Patient's Name: Kristin [name]  MRN: [id number]  Referring Provider: Arlis Porta., MD  DOB: 1929/12/19  PCP: Dicky Doe, MD  DOS: 06/29/2016  Note by: Kathlen Brunswick. Dossie Arbour, MD  Service setting: Ambulatory outpatient  Specialty: Interventional Pain Management  Location: [facility] (AMB) Pain Management Facility    Patient type: Established   Primary Reason(s) for Visit: Encounter for prescription drug management (Level of risk: moderate) CC: Back Pain (lower) and Shoulder Pain (right)  HPI  Kristin [name] is a 82 y.o. year old, female patient, who comes today for a medication management evaluation. She has Abnormal glucose tolerance test; Chronic diastolic heart failure ([place]); Chronic kidney disease (CKD), stage III (moderate); CAFL (chronic airflow limitation) ([place]); Complete rotator cuff rupture of shoulder (Left); CCF (congestive cardiac failure) (HCC); DDD (degenerative disc disease), lumbosacral; Gout; Essential (primary) hypertension; Benign essential HTN; BP (high blood pressure); H/O cardiac catheterization; Arrhythmia, sinus node ([place]); Upper respiratory infection, viral; Lumbar canal stenosis; Urinary frequency; Chronic obstructive pulmonary disease ([place]); Congestive heart failure ([place]); Degeneration of intervertebral disc of lumbosacral region; Chemical diabetes; History of cardiac catheterization; Sinoatrial node dysfunction ([place]); Need for influenza vaccination; Frequency of urination; Seasonal allergies; Breast cancer, right breast ([place]); Chronic pain; Chronic pain syndrome; Chronic low back pain (Location of Primary Source of Pain) (Bilateral) (L>R); Lumbar facet syndrome (Bilateral) (L>R); Lumbar spondylosis; Chronic lumbar radicular pain (Bilateral) (L>R) (L5 Dermatome); Restless leg syndrome; Myofascial pain; Neuropathic pain; Neurogenic pain; Long term current use of opiate analgesic; Long term prescription opiate use; Opiate use (90 MME/Day); Encounter for therapeutic drug level  monitoring; Chronic constipation; Opioid-induced constipation (OIC); Vitamin D insufficiency; Grade 1 Retrolisthesis of L1 over L2; Lumbar facet hypertrophy (L1-2, L2-3, and L4-5); Stenosis of lateral recess of lumbar spine (L1-2 and L4-5); 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); Epidural fibrosis; Lumbar postlaminectomy syndrome; Chronic hip pain (bilateral) (L>R); 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); 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 Her primarily concern today is the Back Pain (lower) and Shoulder Pain (right)  Pain Assessment: Self-Reported Pain Score: 5 /10 Clinically the patient looks like a 1/10 Reported level is inconsistent with clinical observations. Information on the proper use of the pain score provided to the patient today. Pain Type: Chronic pain Pain Location: Back Pain Orientation: Lower Pain Descriptors / Indicators: Aching, Constant, Throbbing  Kristin [name] was last seen on 06/29/2016 for medication management. During today's appointment we reviewed Kristin [name]'s chronic pain status, as well as her outpatient medication regimen. The patient comes into the clinics today as an add-on to get her Requip. On the patient's last appointment, she did not request the medication and therefore he was not prescribed. She is also requesting the magnesium, but we have pointed out that this medication is over-the-counter.  The patient  reports that she does not use drugs. Her  body mass index is 25.7 kg/m.  Further details on both, my assessment(s), as well as the proposed treatment plan, please see below.  Controlled Substance Pharmacotherapy Assessment REMS (Risk Evaluation and Mitigation Strategy)  Analgesic:Oxycodone IR 20 mg every 8 hours (60 mg/day) MME/day:90 mg/day Evon Slack,  RN  06/29/2016 10:13 AM  Sign at close encounter Safety precautions to be maintained throughout the outpatient stay will include: orient to surroundings, keep bed in low position, maintain call bell within reach at all times, provide assistance with transfer out of bed and ambulation.    Pharmacokinetics: Liberation and absorption (onset of action): WNL Distribution (time to peak effect): WNL Metabolism and excretion (duration of action): WNL         Pharmacodynamics: Desired effects: Analgesia: The patient reports >50% benefit. Reported improvement in function: The patient reports medication allows her to accomplish basic ADLs. Clinically meaningful improvement in function (CMIF): Sustained CMIF goals met Perceived effectiveness: Described as relatively effective, allowing for increase in activities of daily living (ADL) Undesirable effects: Side-effects or Adverse reactions: None reported Monitoring: [place] PMP: Online review of the past 9-monthperiod conducted. Compliant with practice rules and regulations List of all UDS test(s) done:  Lab Results  Component Value Date   TOXASSSELUR FINAL 12/18/2015   TOXASSSELUR FINAL 09/24/2015   TOXASSSELUR FINAL 06/25/2015   Last UDS on record: ToxAssure Select 13  Date Value Ref Range Status  12/18/2015 FINAL  Final    Comment:    ==================================================================== TOXASSURE SELECT 13 (MW) ==================================================================== Test                             Result       Flag       Units Drug Present and Declared for Prescription Verification   Oxycodone                       2377         EXPECTED   ng/mg creat   Oxymorphone                    2225         EXPECTED   ng/mg creat   Noroxycodone                   7596         EXPECTED   ng/mg creat   Noroxymorphone                 911          EXPECTED   ng/mg creat    Sources of oxycodone are scheduled prescription medications.    Oxymorphone, noroxycodone, and noroxymorphone are expected    metabolites of oxycodone. Oxymorphone is also available as a    scheduled prescription medication. ==================================================================== Test                      Result    Flag   Units      Ref Range   Creatinine              103              mg/dL      >=20 ==================================================================== Declared Medications:  The flagging and interpretation on this report are based on the  following declared medications.  Unexpected results may arise from  inaccuracies in the declared medications.  **Note: The testing scope of this panel includes these medications:  Oxycodone  **Note: The testing scope of this panel does not include following  reported medications:  Albuterol  Apixaban  Aspirin  Bisacodyl  Calcium citrate (Calcium citrate/Vitamin D)  Cholecalciferol  Furosemide  Levocetirizine  Lisinopril  Magnesium Oxide  Omeprazole  Oxygen  Ropinirole  Supplement  Vitamin D (Calcium citrate/Vitamin D) ==================================================================== For clinical consultation, please call [phone]. ====================================================================    UDS interpretation: Compliant          Medication Assessment Form: Reviewed. Patient indicates being compliant with therapy Treatment compliance: Compliant Risk Assessment Profile: Aberrant behavior: See prior evaluations. None observed or detected today Comorbid factors increasing risk of overdose: See prior notes. No additional risks detected  today Risk of substance use disorder (SUD): Low Opioid Risk Tool (ORT) Total Score: 3  Interpretation Table:  Score <3 = Low Risk for SUD  Score between 4-7 = Moderate Risk for SUD  Score >8 = High Risk for Opioid Abuse   Risk Mitigation Strategies:  Patient Counseling: Covered Patient-Prescriber Agreement (PPA): Present and active  Notification to other healthcare providers: Done  Pharmacologic Plan: No change in therapy, at this time  Laboratory Chemistry  Inflammation Markers Lab Results  Component Value Date   ESRSEDRATE 12 09/24/2015   CRP <0.5 09/24/2015   Renal Function Lab Results  Component Value Date   BUN 21 03/02/2016   CREATININE 1.16 (H) 03/02/2016   GFRAA 50 (L) 03/02/2016   GFRNONAA 43 (L) 03/02/2016   Hepatic Function Lab Results  Component Value Date   AST 21 03/02/2016   ALT 10 03/02/2016   ALBUMIN 3.7 03/02/2016   Electrolytes Lab Results  Component Value Date   NA 143 03/02/2016   K 4.6 03/02/2016   CL 105 03/02/2016   CALCIUM 10.0 03/02/2016   MG 2.2 09/24/2015   Pain Modulating Vitamins No results found for: Maralyn Sago [id number], [id number], [id number], 25OHVITD1, 25OHVITD2, 25OHVITD3, VITAMINB12 Coagulation Parameters Lab Results  Component Value Date   INR 1.24 12/16/2015   LABPROT 15.8 (H) 12/16/2015   APTT 26 12/16/2015   PLT 191 03/02/2016   Cardiovascular Lab Results  Component Value Date   HGB 13.5 03/02/2016   HCT 41.6 03/02/2016   Note: Lab results reviewed.  Recent Diagnostic Imaging Review  Dg Chest 2 View  Result Date: 12/16/2015 CLINICAL DATA:  82 year old female with a history of preoperative chest x-ray for pacemaker insertion EXAM: CHEST - 2 VIEW COMPARISON:  10/28/2015, 08/27/2014, CT chest 09/07/2014 FINDINGS: Cardiomediastinal silhouette unchanged in size and contour. Atherosclerotic calcifications of the aortic arch. Tortuosity of descending thoracic aorta. Coarsened interstitial markings bilaterally. No  confluent airspace disease. Stigmata of emphysema, with increased retrosternal airspace, flattened hemidiaphragms, increased AP diameter, and hyperinflation on the AP view. No pleural effusion or pneumothorax. Surgical changes of the right axilla. No displaced fracture. Multilevel degenerative changes of the spine. Surgical changes of bilateral shoulder reverse arthroplasty. IMPRESSION: Emphysema and chronic lung changes without evidence of superimposed acute cardiopulmonary disease. Atherosclerosis Signed, Dulcy Fanny. Earleen [place], DO [facility] Electronically Signed   By: Corrie Mckusick D.O.   On: 12/16/2015 10:50   Note: Imaging results reviewed.  Meds  The patient has a current medication list which includes the following prescription(s): albuterol, apixaban, aspirin ec, bisacodyl, calcium citrate-vitamin d, docusate calcium, furosemide, levocetirizine, lisinopril, magnesium oxide, omeprazole, oxycodone hcl, oxycodone hcl, oxycodone hcl, oxygen-helium, ropinirole, and benefiber drink mix.  Current Outpatient Prescriptions on File Prior to Visit  Medication Sig  . albuterol (PROVENTIL) (2.5 MG/3ML) 0.083% nebulizer solution Take 3 mLs (2.5 mg total) by nebulization every 8 (eight) hours as needed. Shortness of breath and wheezing  . apixaban (ELIQUIS) 5 MG TABS tablet Take 1 tablet (5 mg total) by mouth  2 (two) times daily.  Marland Kitchen aspirin EC 325 MG tablet Take by mouth.  . bisacodyl (DULCOLAX) 5 MG EC tablet Take 2 tablets (10 mg total) by mouth daily as needed for moderate constipation.  . calcium citrate-vitamin D (CITRACAL+D) 315-200 MG-UNIT per tablet Take 1 tablet by mouth 2 (two) times daily.  Marland Kitchen docusate calcium (SURFAK) 240 MG capsule Take 240 mg by mouth.  . furosemide (LASIX) 20 MG tablet Take 1 tablet (20 mg total) by mouth daily.  Marland Kitchen levocetirizine (XYZAL) 5 MG tablet Take 1 tablet (5 mg total) by mouth every evening.  Marland Kitchen lisinopril  (PRINIVIL,ZESTRIL) 10 MG tablet Take 1 tablet (10 mg total) by mouth daily.  . magnesium oxide (MAG-OX) 400 MG tablet Take 1 tablet (400 mg total) by mouth daily.  Marland Kitchen omeprazole (PRILOSEC) 20 MG capsule Take 1 capsule (20 mg total) by mouth daily.  . Oxycodone HCl 20 MG TABS Take 1 tablet (20 mg total) by mouth every 8 (eight) hours as needed.  Derrill Memo ON 07/15/2016] Oxycodone HCl 20 MG TABS Take 1 tablet (20 mg total) by mouth every 8 (eight) hours as needed.  Derrill Memo ON 08/14/2016] Oxycodone HCl 20 MG TABS Take 1 tablet (20 mg total) by mouth every 8 (eight) hours as needed.  . OXYGEN Inhale 2 Doses into the lungs at bedtime.  . Wheat Dextrin (BENEFIBER DRINK MIX) PACK Stir 2 teaspoons of Benefiber into 4-8 oz of any non-carbonated beverage or soft food (hot or cold) PO TID as needed   No current facility-administered medications on file prior to visit.    ROS  Constitutional: Denies any fever or chills Gastrointestinal: No reported hemesis, hematochezia, vomiting, or acute GI distress Musculoskeletal: Denies any acute onset joint swelling, redness, loss of ROM, or weakness Neurological: No reported episodes of acute onset apraxia, aphasia, dysarthria, agnosia, amnesia, paralysis, loss of coordination, or loss of consciousness  Allergies  Kristin [name] is allergic to cyclobenzaprine; flexeril [cyclobenzaprine hcl]; and vancomycin.  PFSH  Drug: Kristin [name]  reports that she does not use drugs. Alcohol:  reports that she does not drink alcohol. Tobacco:  reports that she quit smoking about 17 years ago. Her smoking use included Cigarettes. She has a 50.00 pack-year smoking history. She has never used smokeless tobacco. Medical:  has a past medical history of Arthritis; Arthritis; Atrial fibrillation ([place]); Back pain; Bradycardia; Cancer [facility]) (2004); CHF (congestive heart failure) ([place]); Chronic back pain; CKD (chronic kidney disease); COPD (chronic obstructive pulmonary disease) ([place]);  Cystocele; Decubitus ulcers; Gout; Hematuria; Hepatitis C; Hyperlipidemia; Hypertension; Hypomagnesemia (06/25/2015); OAB (overactive bladder); Overactive bladder; Restless leg; Urinary frequency; UTI (lower urinary tract infection); and Vaginal atrophy. Family: family history includes Breast cancer in her paternal aunt; Breast cancer (age of onset: 23) in her sister; Cancer in her sister; Heart disease in her father and mother; Kidney disease in her brother; Stroke in her mother.  Past Surgical History:  Procedure Laterality Date  . ABDOMINAL HYSTERECTOMY    . BACK SURGERY    . BREAST SURGERY     rt lumpectomy  . CARPAL TUNNEL RELEASE    . CATARACT EXTRACTION W/PHACO  10/19/2011   Procedure: CATARACT EXTRACTION PHACO AND INTRAOCULAR LENS PLACEMENT (IOC);  Surgeon: Elta Guadeloupe T. Gershon Crane, MD;  Location: [facility];  Service: Ophthalmology;  Laterality: Right;  CDE:10.81  . CATARACT EXTRACTION W/PHACO  11/02/2011   Procedure: CATARACT EXTRACTION PHACO AND INTRAOCULAR LENS PLACEMENT (IOC);  Surgeon: Elta Guadeloupe T. Gershon Crane, MD;  Location: [facility];  Service:  Ophthalmology;  Laterality: Left;  CDE 8.60  . COLON SURGERY    . JOINT REPLACEMENT     bilateral TKA  . PACEMAKER INSERTION N/A 12/24/2015   Procedure: INSERTION PACEMAKER;  Surgeon: Isaias Cowman, MD;  Location: [facility] ORS;  Service: Cardiovascular;  Laterality: N/A;  . ROTATOR CUFF REPAIR     bilateral   Constitutional Exam  General appearance: Well nourished, well developed, and well hydrated. In no apparent acute distress Vitals:   06/29/16 0959  BP: (!) 150/83  Pulse: 81  Resp: 16  Temp: 98.2 F (36.8 C)  TempSrc: Oral  SpO2: 100%  Weight: 174 lb (78.9 kg)  Height: '5\' 9"'  (1.753 m)   BMI Assessment: Estimated body mass index is 25.7 kg/m as calculated from the following:   Height as of this encounter: '5\' 9"'  (1.753 m).   Weight as of this encounter: 174 lb (78.9 kg).  BMI interpretation table: BMI level Category Range association with  higher incidence of chronic pain  <18 kg/m2 Underweight   18.5-24.9 kg/m2 Ideal body weight   25-29.9 kg/m2 Overweight Increased incidence by 20%  30-34.9 kg/m2 Obese (Class I) Increased incidence by 68%  35-39.9 kg/m2 Severe obesity (Class II) Increased incidence by 136%  >40 kg/m2 Extreme obesity (Class III) Increased incidence by 254%   BMI Readings from Last 4 Encounters:  06/29/16 25.70 kg/m  06/22/16 27.25 kg/m  06/14/16 25.70 kg/m  03/17/16 26.94 kg/m   Wt Readings from Last 4 Encounters:  06/29/16 174 lb (78.9 kg)  06/22/16 174 lb (78.9 kg)  06/14/16 174 lb (78.9 kg)  03/17/16 172 lb (78 kg)  Psych/Mental status: Alert, oriented x 3 (person, place, & time) Eyes: PERLA Respiratory: No evidence of acute respiratory distress  Cervical Spine Exam  Inspection: No masses, redness, or swelling Alignment: Symmetrical Functional ROM: Unrestricted ROM Stability: No instability detected Muscle strength & Tone: Functionally intact Sensory: Unimpaired Palpation: Non-contributory  Upper Extremity (UE) Exam    Side: Right upper extremity  Side: Left upper extremity  Inspection: No masses, redness, swelling, or asymmetry  Inspection: No masses, redness, swelling, or asymmetry  Functional ROM: Unrestricted ROM         Functional ROM: Unrestricted ROM          Muscle strength & Tone: Functionally intact  Muscle strength & Tone: Functionally intact  Sensory: Unimpaired  Sensory: Unimpaired  Palpation: Non-contributory  Palpation: Non-contributory   Thoracic Spine Exam  Inspection: No masses, redness, or swelling Alignment: Symmetrical Functional ROM: Unrestricted ROM Stability: No instability detected Sensory: Unimpaired Muscle strength & Tone: Functionally intact Palpation: Non-contributory  Lumbar Spine Exam  Inspection: No masses, redness, or swelling Alignment: Symmetrical Functional ROM: Decreased ROM Stability: No instability detected Muscle strength & Tone:  Functionally intact Sensory: Movement-associated discomfort Palpation: Complains of area being tender to palpation Provocative Tests: Lumbar Hyperextension and rotation test: Positive bilaterally for facet joint pain. Patrick's Maneuver: evaluation deferred today              Gait & Posture Assessment  Ambulation: Unassisted Gait: Relatively normal for age and body habitus Posture: WNL   Lower Extremity Exam    Side: Right lower extremity  Side: Left lower extremity  Inspection: No masses, redness, swelling, or asymmetry  Inspection: No masses, redness, swelling, or asymmetry  Functional ROM: Unrestricted ROM          Functional ROM: Unrestricted ROM          Muscle strength & Tone: Functionally intact  Muscle strength & Tone: Functionally intact  Sensory: Unimpaired  Sensory: Unimpaired  Palpation: Non-contributory  Palpation: Non-contributory   Assessment  Primary Diagnosis & Pertinent Problem List: The encounter diagnosis was Restless leg syndrome.  Visit Diagnosis: 1. Restless leg syndrome    Plan of Care  Pharmacotherapy (Medications Ordered): Meds ordered this encounter  Medications  . rOPINIRole (REQUIP) 2 MG tablet    Sig: Take 1 tablet (2 mg total) by mouth at bedtime.    Dispense:  76 tablet    Refill:  0    Do not place this medication, or any other prescription from our practice, on "Automatic Refill". Patient may have prescription filled one day early if pharmacy is closed on scheduled refill date.   New Prescriptions   No medications on file   Medications administered today: Kristin [name] had no medications administered during this visit. Lab-work, procedure(s), and/or referral(s): No orders of the defined types were placed in this encounter.  Imaging and/or referral(s): None  Interventional therapies: Planned, scheduled, and/or pending:   Palliative bilateral lumbar facet block under fluoroscopic guidance and IV sedation, followed by a right  intra-articular shoulder joint injection, 2 weeks later.    Considering:   Possible bilateral lumbar facet radiofrequency.   Palliative PRN treatment(s):   Diagnostic right intra-articular shoulder joint injection under fluoroscopic guidance, no sedation.  Diagnostic right suprascapular nerve block under fluoroscopic guidance, with or without sedation.  Possible right suprascapular radiofrequency ablation.    Provider-requested follow-up: Return for Keep prior appointment.  Future Appointments Date Time Provider [place]  07/07/2016 9:15 AM Milinda Pointer, MD [facility] None  07/21/2016 9:15 AM Milinda Pointer, MD [facility] None  09/09/2016 9:30 AM Milinda Pointer, MD [facility] None   Primary Care Physician: Dicky Doe, MD Location: [facility] Outpatient Pain Management Facility Note by: Kathlen Brunswick. Dossie Arbour, M.D, DABA, DABAPM, DABPM, DABIPP, FIPP  Pain Score Disclaimer: We use the NRS-11 scale. This is a self-reported, subjective measurement of pain severity with only modest accuracy. It is used primarily to identify changes within a particular patient. It must be understood that outpatient pain scales are significantly less accurate that those used for research, where they can be applied under ideal controlled circumstances with minimal exposure to variables. In reality, the score is likely to be a combination of pain intensity and pain affect, where pain affect describes the degree of emotional arousal or changes in action readiness caused by the sensory experience of pain. Factors such as social and work situation, setting, emotional state, anxiety levels, expectation, and prior pain experience may influence pain perception and show large inter-individual differences that may also be affected by time variables.  Patient instructions provided during this appointment: Patient Instructions   GENERAL RISKS AND COMPLICATIONS  What are the risk, side effects and possible  complications? Generally speaking, most procedures are safe.  However, with any procedure there are risks, side effects, and the possibility of complications.  The risks and complications are dependent upon the sites that are lesioned, or the type of nerve block to be performed.  The closer the procedure is to the spine, the more serious the risks are.  Great care is taken when placing the radio frequency needles, block needles or lesioning probes, but sometimes complications can occur. 1. Infection: Any time there is an injection through the skin, there is a risk of infection.  This is why sterile conditions are used for these blocks.  There are four possible types of infection. 1. Localized skin  infection. 2. Central Nervous System Infection-This can be in the form of Meningitis, which can be deadly. 3. Epidural Infections-This can be in the form of an epidural abscess, which can cause pressure inside of the spine, causing compression of the spinal cord with subsequent paralysis. This would require an emergency surgery to decompress, and there are no guarantees that the patient would recover from the paralysis. 4. Discitis-This is an infection of the intervertebral discs.  It occurs in about 1% of discography procedures.  It is difficult to treat and it may lead to surgery.        2. Pain: the needles have to go through skin and soft tissues, will cause soreness.       3. Damage to internal structures:  The nerves to be lesioned may be near blood vessels or    other nerves which can be potentially damaged.       4. Bleeding: Bleeding is more common if the patient is taking blood thinners such as  aspirin, Coumadin, Ticiid, Plavix, etc., or if he/she have some genetic predisposition  such as hemophilia. Bleeding into the spinal canal can cause compression of the spinal  cord with subsequent paralysis.  This would require an emergency surgery to  decompress and there are no guarantees that the patient  would recover from the  paralysis.       5. Pneumothorax:  Puncturing of a lung is a possibility, every time a needle is introduced in  the area of the chest or upper back.  Pneumothorax refers to free air around the  collapsed lung(s), inside of the thoracic cavity (chest cavity).  Another two possible  complications related to a similar event would include: Hemothorax and Chylothorax.   These are variations of the Pneumothorax, where instead of air around the collapsed  lung(s), you may have blood or chyle, respectively.       6. Spinal headaches: They may occur with any procedures in the area of the spine.       7. Persistent CSF (Cerebro-Spinal Fluid) leakage: This is a rare problem, but may occur  with prolonged intrathecal or epidural catheters either due to the formation of a fistulous  track or a dural tear.       8. Nerve damage: By working so close to the spinal cord, there is always a possibility of  nerve damage, which could be as serious as a permanent spinal cord injury with  paralysis.       9. Death:  Although rare, severe deadly allergic reactions known as "Anaphylactic  reaction" can occur to any of the medications used.      10. Worsening of the symptoms:  We can always make thing worse.  What are the chances of something like this happening? Chances of any of this occuring are extremely low.  By statistics, you have more of a chance of getting killed in a motor vehicle accident: while driving to the hospital than any of the above occurring .  Nevertheless, you should be aware that they are possibilities.  In general, it is similar to taking a shower.  Everybody knows that you can slip, hit your head and get killed.  Does that mean that you should not shower again?  Nevertheless always keep in mind that statistics do not mean anything if you happen to be on the wrong side of them.  Even if a procedure has a 1 (one) in a 1,000,000 (million) chance of going wrong, it  you happen to be that  one..Also, keep in mind that by statistics, you have more of a chance of having something go wrong when taking medications.  Who should not have this procedure? If you are on a blood thinning medication (e.g. Coumadin, Plavix, see list of "Blood Thinners"), or if you have an active infection going on, you should not have the procedure.  If you are taking any blood thinners, please inform your physician.  How should I prepare for this procedure?  Do not eat or drink anything at least six hours prior to the procedure.  Bring a driver with you .  It cannot be a taxi.  Come accompanied by an adult that can drive you back, and that is strong enough to help you if your legs get weak or numb from the local anesthetic.  Take all of your medicines the morning of the procedure with just enough water to swallow them.  If you have diabetes, make sure that you are scheduled to have your procedure done first thing in the morning, whenever possible.  If you have diabetes, take only half of your insulin dose and notify our nurse that you have done so as soon as you arrive at the clinic.  If you are diabetic, but only take blood sugar pills (oral hypoglycemic), then do not take them on the morning of your procedure.  You may take them after you have had the procedure.  Do not take aspirin or any aspirin-containing medications, at least eleven (11) days prior to the procedure.  They may prolong bleeding.  Wear loose fitting clothing that may be easy to take off and that you would not mind if it got stained with Betadine or blood.  Do not wear any jewelry or perfume  Remove any nail coloring.  It will interfere with some of our monitoring equipment.  NOTE: Remember that this is not meant to be interpreted as a complete list of all possible complications.  Unforeseen problems may occur.  BLOOD THINNERS The following drugs contain aspirin or other products, which can cause increased bleeding during  surgery and should not be taken for 2 weeks prior to and 1 week after surgery.  If you should need take something for relief of minor pain, you may take acetaminophen which is found in Tylenol,m Datril, Anacin-3 and Panadol. It is not blood thinner. The products listed below are.  Do not take any of the products listed below in addition to any listed on your instruction sheet.  A.P.C or A.P.C with Codeine Codeine Phosphate Capsules #3 Ibuprofen Ridaura  ABC compound Congesprin Imuran rimadil  Advil Cope Indocin Robaxisal  Alka-Seltzer Effervescent Pain Reliever and Antacid Coricidin or Coricidin-D  Indomethacin Rufen  Alka-Seltzer plus Cold Medicine Cosprin Ketoprofen S-A-C Tablets  Anacin Analgesic Tablets or Capsules Coumadin Korlgesic Salflex  Anacin Extra Strength Analgesic tablets or capsules CP-2 Tablets Lanoril Salicylate  Anaprox Cuprimine Capsules Levenox Salocol  Anexsia-D Dalteparin Magan Salsalate  Anodynos Darvon compound Magnesium Salicylate Sine-off  Ansaid Dasin Capsules Magsal Sodium Salicylate  Anturane Depen Capsules Marnal Soma  APF Arthritis pain formula Dewitt's Pills Measurin Stanback  Argesic Dia-Gesic Meclofenamic Sulfinpyrazone  Arthritis Bayer Timed Release Aspirin Diclofenac Meclomen Sulindac  Arthritis pain formula Anacin Dicumarol Medipren Supac  Analgesic (Safety coated) Arthralgen Diffunasal Mefanamic Suprofen  Arthritis Strength Bufferin Dihydrocodeine Mepro Compound Suprol  Arthropan liquid Dopirydamole Methcarbomol with Aspirin Synalgos  ASA tablets/Enseals Disalcid Micrainin Tagament  Ascriptin Doan's Midol Talwin  Ascriptin A/D Dolene Mobidin  Tanderil  Ascriptin Extra Strength Dolobid Moblgesic Ticlid  Ascriptin with Codeine Doloprin or Doloprin with Codeine Momentum Tolectin  Asperbuf Duoprin Mono-gesic Trendar  Aspergum Duradyne Motrin or Motrin IB Triminicin  Aspirin plain, buffered or enteric coated Durasal Myochrisine Trigesic  Aspirin  Suppositories Easprin Nalfon Trillsate  Aspirin with Codeine Ecotrin Regular or Extra Strength Naprosyn Uracel  Atromid-S Efficin Naproxen Ursinus  Auranofin Capsules Elmiron Neocylate Vanquish  Axotal Emagrin Norgesic Verin  Azathioprine Empirin or Empirin with Codeine Normiflo Vitamin E  Azolid Emprazil Nuprin Voltaren  Bayer Aspirin plain, buffered or children's or timed BC Tablets or powders Encaprin Orgaran Warfarin Sodium  Buff-a-Comp Enoxaparin Orudis Zorpin  Buff-a-Comp with Codeine Equegesic Os-Cal-Gesic   Buffaprin Excedrin plain, buffered or Extra Strength Oxalid   Bufferin Arthritis Strength Feldene Oxphenbutazone   Bufferin plain or Extra Strength Feldene Capsules Oxycodone with Aspirin   Bufferin with Codeine Fenoprofen Fenoprofen Pabalate or Pabalate-SF   Buffets II Flogesic Panagesic   Buffinol plain or Extra Strength Florinal or Florinal with Codeine Panwarfarin   Buf-Tabs Flurbiprofen Penicillamine   Butalbital Compound Four-way cold tablets Penicillin   Butazolidin Fragmin Pepto-Bismol   Carbenicillin Geminisyn Percodan   Carna Arthritis Reliever Geopen Persantine   Carprofen Gold's salt Persistin   Chloramphenicol Goody's Phenylbutazone   Chloromycetin Haltrain Piroxlcam   Clmetidine heparin Plaquenil   Cllnoril Hyco-pap Ponstel   Clofibrate Hydroxy chloroquine Propoxyphen         Before stopping any of these medications, be sure to consult the physician who ordered them.  Some, such as Coumadin (Warfarin) are ordered to prevent or treat serious conditions such as "deep thrombosis", "pumonary embolisms", and other heart problems.  The amount of time that you may need off of the medication may also vary with the medication and the reason for which you were taking it.  If you are taking any of these medications, please make sure you notify your pain physician before you undergo any procedures.         Facet Blocks Patient Information  Description: The facets  are joints in the spine between the vertebrae.  Like any joints in the body, facets can become irritated and painful.  Arthritis can also effect the facets.  By injecting steroids and local anesthetic in and around these joints, we can temporarily block the nerve supply to them.  Steroids act directly on irritated nerves and tissues to reduce selling and inflammation which often leads to decreased pain.  Facet blocks may be done anywhere along the spine from the neck to the low back depending upon the location of your pain.   After numbing the skin with local anesthetic (like Novocaine), a small needle is passed onto the facet joints under x-ray guidance.  You may experience a sensation of pressure while this is being done.  The entire block usually lasts about 15-25 minutes.   Conditions which may be treated by facet blocks:   Low back/buttock pain  Neck/shoulder pain  Certain types of headaches  Preparation for the injection:  1. Do not eat any solid food or dairy products within 8 hours of your appointment. 2. You may drink clear liquid up to 3 hours before appointment.  Clear liquids include water, black coffee, juice or soda.  No milk or cream please. 3. You may take your regular medication, including pain medications, with a sip of water before your appointment.  Diabetics should hold regular insulin (if taken separately) and take 1/2 normal NPH dose the morning of  the procedure.  Carry some sugar containing items with you to your appointment. 4. A driver must accompany you and be prepared to drive you home after your procedure. 5. Bring all your current medications with you. 6. An IV may be inserted and sedation may be given at the discretion of the physician. 7. A blood pressure cuff, EKG and other monitors will often be applied during the procedure.  Some patients may need to have extra oxygen administered for a short period. 8. You will be asked to provide medical information,  including your allergies and medications, prior to the procedure.  We must know immediately if you are taking blood thinners (like Coumadin/Warfarin) or if you are allergic to IV iodine contrast (dye).  We must know if you could possible be pregnant.  Possible side-effects:   Bleeding from needle site  Infection (rare, may require surgery)  Nerve injury (rare)  Numbness & tingling (temporary)  Difficulty urinating (rare, temporary)  Spinal headache (a headache worse with upright posture)  Light-headedness (temporary)  Pain at injection site (serveral days)  Decreased blood pressure (rare, temporary)  Weakness in arm/leg (temporary)  Pressure sensation in back/neck (temporary)   Call if you experience:   Fever/chills associated with headache or increased back/neck pain  Headache worsened by an upright position  New onset, weakness or numbness of an extremity below the injection site  Hives or difficulty breathing (go to the emergency room)  Inflammation or drainage at the injection site(s)  Severe back/neck pain greater than usual  New symptoms which are concerning to you  Please note:  Although the local anesthetic injected can often make your back or neck feel good for several hours after the injection, the pain will likely return. It takes 3-7 days for steroids to work.  You may not notice any pain relief for at least one week.  If effective, we will often do a series of 2-3 injections spaced 3-6 weeks apart to maximally decrease your pain.  After the initial series, you may be a candidate for a more permanent nerve block of the facets.  If you have any questions, please call #[phone]) [facility]

## 2016-06-29 NOTE — Progress Notes (Signed)
Safety precautions to be maintained throughout the outpatient stay will include: orient to surroundings, keep bed in low position, maintain call bell within reach at all times, provide assistance with transfer out of bed and ambulation.  

## 2016-07-06 ENCOUNTER — Other Ambulatory Visit: Payer: Self-pay | Admitting: Family Medicine

## 2016-07-06 DIAGNOSIS — Z1231 Encounter for screening mammogram for malignant neoplasm of breast: Secondary | ICD-10-CM

## 2016-07-07 ENCOUNTER — Encounter: Payer: Self-pay | Admitting: Pain Medicine

## 2016-07-07 ENCOUNTER — Ambulatory Visit: Admission: RE | Admit: 2016-07-07 | Payer: Medicare Other | Source: Ambulatory Visit

## 2016-07-07 ENCOUNTER — Ambulatory Visit (HOSPITAL_BASED_OUTPATIENT_CLINIC_OR_DEPARTMENT_OTHER): Payer: Medicare Other | Admitting: Pain Medicine

## 2016-07-07 ENCOUNTER — Ambulatory Visit
Admission: RE | Admit: 2016-07-07 | Discharge: 2016-07-07 | Disposition: A | Discharge status: Home / Self Care | Payer: Medicare Other | Source: Ambulatory Visit | Attending: Pain Medicine | Admitting: Pain Medicine

## 2016-07-07 VITALS — BP 163/84 | HR 60 | Temp 98.2°F | Resp 16 | Ht 69.0 in | Wt 170.0 lb

## 2016-07-07 DIAGNOSIS — M47816 Spondylosis without myelopathy or radiculopathy, lumbar region: Secondary | ICD-10-CM

## 2016-07-07 DIAGNOSIS — Z888 Allergy status to other drugs, medicaments and biological substances status: Secondary | ICD-10-CM | POA: Insufficient documentation

## 2016-07-07 DIAGNOSIS — M47896 Other spondylosis, lumbar region: Secondary | ICD-10-CM | POA: Insufficient documentation

## 2016-07-07 DIAGNOSIS — G8929 Other chronic pain: Secondary | ICD-10-CM

## 2016-07-07 DIAGNOSIS — M1288 Other specific arthropathies, not elsewhere classified, other specified site: Secondary | ICD-10-CM | POA: Diagnosis not present

## 2016-07-07 DIAGNOSIS — M545 Low back pain: Secondary | ICD-10-CM

## 2016-07-07 MED ORDER — METHYLPREDNISOLONE ACETATE 80 MG/ML IJ SUSP
80.0000 mg | Freq: Once | INTRAMUSCULAR | Status: DC
  Filled 2016-07-07: qty 1

## 2016-07-07 MED ORDER — ROPIVACAINE HCL 2 MG/ML IJ SOLN
9.0000 mL | Freq: Once | INTRAMUSCULAR | Status: AC
  Administered 2016-07-07: 9 mL
  Filled 2016-07-07: qty 10

## 2016-07-07 MED ORDER — LIDOCAINE HCL (PF) 1 % IJ SOLN
10.0000 mL | Freq: Once | INTRAMUSCULAR | Status: DC
  Administered 2016-07-07: 10 mL
  Filled 2016-07-07: qty 10

## 2016-07-07 MED ORDER — TRIAMCINOLONE ACETONIDE 40 MG/ML IJ SUSP
40.0000 mg | Freq: Once | INTRAMUSCULAR | Status: AC
  Administered 2016-07-07: 40 mg
  Filled 2016-07-07: qty 1

## 2016-07-07 MED ORDER — LACTATED RINGERS IV SOLN: 1000.0000 mL | Freq: Once | INTRAVENOUS | Status: DC

## 2016-07-07 MED ORDER — FENTANYL CITRATE (PF) 100 MCG/2ML IJ SOLN
25.0000 ug | INTRAMUSCULAR | Status: DC | PRN
  Filled 2016-07-07: qty 2

## 2016-07-07 MED ORDER — LIDOCAINE HCL (PF) 1 % IJ SOLN
10.0000 mL | Freq: Once | INTRAMUSCULAR | Status: AC
  Administered 2016-07-07: 10 mL
  Filled 2016-07-07: qty 10

## 2016-07-07 MED ORDER — ROPIVACAINE HCL 2 MG/ML IJ SOLN
9.0000 mL | Freq: Once | INTRAMUSCULAR | Status: DC
  Administered 2016-07-07: 9 mL
  Filled 2016-07-07: qty 10

## 2016-07-07 MED ORDER — MIDAZOLAM HCL 5 MG/5ML IJ SOLN
1.0000 mg | INTRAMUSCULAR | Status: DC | PRN
  Filled 2016-07-07: qty 5

## 2016-07-07 NOTE — Progress Notes (Signed)
Safety precautions to be maintained throughout the outpatient stay will include: orient to surroundings, keep bed in low position, maintain call bell within reach at all times, provide assistance with transfer out of bed and ambulation.  

## 2016-07-07 NOTE — Patient Instructions (Signed)
Pain Management Discharge Instructions  General Discharge Instructions :  If you need to reach your doctor call: Monday-Friday 8:00 am - 4:00 pm at [phone] or toll free [phone].  After clinic hours [phone] to have operator reach doctor.  Bring all of your medication bottles to all your appointments in the pain clinic.  To cancel or reschedule your appointment with Pain Management please remember to call 24 hours in advance to avoid a fee.  Refer to the educational materials which you have been given on: General Risks, I had my Procedure. Discharge Instructions, Post Sedation.  Post Procedure Instructions:  The drugs you were given will stay in your system until tomorrow, so for the next 24 hours you should not drive, make any legal decisions or drink any alcoholic beverages.  You may eat anything you prefer, but it is better to start with liquids then soups and crackers, and gradually work up to solid foods.  Please notify your doctor immediately if you have any unusual bleeding, trouble breathing or pain that is not related to your normal pain.  Depending on the type of procedure that was done, some parts of your body may feel week and/or numb.  This usually clears up by tonight or the next day.  Walk with the use of an assistive device or accompanied by an adult for the 24 hours.  You may use ice on the affected area for the first 24 hours.  Put ice in a Ziploc bag and cover with a towel and place against area 15 minutes on 15 minutes off.  You may switch to heat after 24 hours.Facet Joint Block The facet joints connect the bones of the spine (vertebrae). They make it possible for you to bend, twist, and make other movements with your spine. They also prevent you from overbending, overtwisting, and making other excessive movements.  A facet joint block is a procedure where a numbing medicine (anesthetic) is injected into a facet joint. Often, a type of anti-inflammatory  medicine called a steroid is also injected. A facet joint block may be done for two reasons:   Diagnosis. A facet joint block may be done as a test to see whether neck or back pain is caused by a worn-down or infected facet joint. If the pain gets better after a facet joint block, it means the pain is probably coming from the facet joint. If the pain does not get better, it means the pain is probably not coming from the facet joint.   Therapy. A facet joint block may be done to relieve neck or back pain caused by a facet joint. A facet joint block is only done as a therapy if the pain does not improve with medicine, exercise programs, physical therapy, and other forms of pain management. LET [facility] CARE PROVIDER KNOW ABOUT:   Any allergies you have.   All medicines you are taking, including vitamins, herbs, eyedrops, and over-the-counter medicines and creams.   Previous problems you or members of your family have had with the use of anesthetics.   Any blood disorders you have had.   Other health problems you have. RISKS AND COMPLICATIONS Generally, having a facet joint block is safe. However, as with any procedure, complications can occur. Possible complications associated with having a facet joint block include:   Bleeding.   Injury to a nerve near the injection site.   Pain at the injection site.   Weakness or numbness in areas controlled by nerves near  the injection site.   Infection.   Temporary fluid retention.   Allergic reaction to anesthetics or medicines used during the procedure. BEFORE THE PROCEDURE   Follow your health care provider's instructions if you are taking dietary supplements or medicines. You may need to stop taking them or reduce your dosage.   Do not take any new dietary supplements or medicines without asking your health care provider first.   Follow your health care provider's instructions about eating and drinking before the  procedure. You may need to stop eating and drinking several hours before the procedure.   Arrange to have an adult drive you home after the procedure. PROCEDURE  You may need to remove your clothing and dress in an open-back gown so that your health care provider can access your spine.   The procedure will be done while you are lying on an X-ray table. Most of the time you will be asked to lie on your stomach, but you may be asked to lie in a different position if an injection will be made in your neck.   Special machines will be used to monitor your oxygen levels, heart rate, and blood pressure.   If an injection will be made in your neck, an intravenous (IV) tube will be inserted into one of your veins. Fluids and medicine will flow directly into your body through the IV tube.   The area over the facet joint where the injection will be made will be cleaned with an antiseptic soap. The surrounding skin will be covered with sterile drapes.   An anesthetic will be applied to your skin to make the injection area numb. You may feel a temporary stinging or burning sensation.   A video X-ray machine will be used to locate the joint. A contrast dye may be injected into the facet joint area to help with locating the joint.   When the joint is located, an anesthetic medicine will be injected into the joint through the needle.   Your health care provider will ask you whether you feel pain relief. If you do feel relief, a steroid may be injected to provide pain relief for a longer period of time. If you do not feel relief or feel only partial relief, additional injections of an anesthetic may be made in other facet joints.   The needle will be removed, the skin will be cleansed, and bandages will be applied.  AFTER THE PROCEDURE   You will be observed for 15-30 minutes before being allowed to go home. Do not drive. Have an adult drive you or take a taxi or public transportation instead.    If you feel pain relief, the pain will return in several hours or days when the anesthetic wears off.   You may feel pain relief 2-14 days after the procedure. The amount of time this relief lasts varies from person to person.   It is normal to feel some tenderness over the injected area(s) for 2 days following the procedure.   If you have diabetes, you may have a temporary increase in blood sugar. This information is not intended to replace advice given to you by your health care provider. Make sure you discuss any questions you have with your health care provider. Document Released: 12/29/2006 Document Revised: 08/30/2014 Document Reviewed: 05/05/2015 Elsevier Interactive Patient Education  2017 [place] After Refer to this sheet in the next few weeks. These instructions provide you with information on caring  for yourself after your procedure. Your health care provider may also give you more specific instructions. Your treatment has been planned according to current medical practices, but problems sometimes occur. Call your health care provider if you have any problems or questions after your procedure. HOME CARE INSTRUCTIONS   Keep track of the amount of pain relief you feel and how long it lasts.  Limit pain medicine within the first 4-6 hours after the procedure as directed by your health care provider.  Resume taking dietary supplements and medicines as directed by your health care provider.  You may resume your regular diet.  Do not apply heat near or over the injection site(s) for 24 hours.   Do not take a bath or soak in water (such as a pool or lake) for 24 hours.  Do not drive for 24 hours unless approved by your health care provider.  Avoid strenuous activity for 24 hours.  Remove your bandages the morning after the procedure.   If the injection site is tender, applying an ice pack may relieve some tenderness. To do this:  Put  ice in a bag.  Place a towel between your skin and the bag.  Leave the ice on for 15-20 minutes, 3-4 times a day.  Keep follow-up appointments as directed by your health care provider. SEEK MEDICAL CARE IF:   Your pain is not controlled by your medicines.   There is drainage from the injection site.   There is significant bleeding or swelling at the injection site.  You have diabetes and your blood sugar is above 180 mg/dL. SEEK IMMEDIATE MEDICAL CARE IF:   You develop a fever of 101F (38.3C) or greater.   You have worsening pain or swelling around the injection site.   You have red streaking around the injection site.   You develop severe pain that is not controlled by your medicines.   You develop a headache, stiff neck, nausea, or vomiting.   Your eyes become very sensitive to light.   You have weakness, paralysis, or tingling in your arms or legs that was not present before the procedure.   You develop difficulty urinating or breathing.  This information is not intended to replace advice given to you by your health care provider. Make sure you discuss any questions you have with your health care provider. Document Released: 07/26/2012 Document Revised: 08/30/2014 Document Reviewed: 05/05/2015 Elsevier Interactive Patient Education  2017 [employer].

## 2016-07-07 NOTE — Progress Notes (Signed)
Patient's Name: Kristin [name]  MRN: [id number]  Referring Provider: Arlis Porta., MD  DOB: 1930-05-18  PCP: Arlis Porta., MD  DOS: 07/07/2016  Note by: Kathlen Brunswick. Dossie Arbour, MD  Service setting: Ambulatory outpatient  Location: [facility] (AMB) Pain Management Facility  Visit type: Procedure  Specialty: Interventional Pain Management  Patient type: Established   Primary Reason for Visit: Interventional Pain Management Treatment. CC: Back Pain (lower left)  Procedure #1:  Anesthesia, Analgesia, Anxiolysis:  Type: Diagnostic Medial Branch Facet Block Region: Lumbar Level: L2, L3, L4, L5, & S1 Medial Branch Level(s) Laterality: Bilateral  Type: Local Anesthesia with Moderate (Conscious) Sedation Local Anesthetic: Lidocaine 1% Route: Intravenous (IV) IV Access: Secured Sedation: Meaningful verbal contact was maintained at all times during the procedure  Indication(s): Analgesia and Anxiety  Indications: 1. Chronic low back pain (Location of Primary Source of Pain) (Bilateral) (L>R)   2. Lumbar facet syndrome (Bilateral) (L>R)   3. Lumbar facet hypertrophy (L1-2, L2-3, and L4-5)   4. Lumbar spondylosis    Pain Score: Pre-procedure: 5 /10 Post-procedure: 0-No pain/10  Pre-Procedure Assessment:  Ms. [name] is a 82 y.o. (year old), female patient, seen today for interventional treatment. She  has a past surgical history that includes Abdominal hysterectomy; Back surgery; Rotator cuff repair; Breast surgery; Cataract extraction w/PHACO (10/19/2011); Cataract extraction w/PHACO (11/02/2011); Carpal tunnel release; Joint replacement; Colon surgery; and Pacemaker insertion (N/A, 12/24/2015).. Her primarily concern today is the Back Pain (lower left) The primary encounter diagnosis was Chronic low back pain (Location of Primary Source of Pain) (Bilateral) (L>R). Diagnoses of Lumbar facet syndrome (Bilateral) (L>R), Lumbar facet hypertrophy (L1-2, L2-3, and L4-5), and Lumbar spondylosis were also  pertinent to this visit.  Pain Type: Chronic pain Pain Location: Back Pain Orientation: Left, Right (worse on the left) Pain Descriptors / Indicators: Aching, Constant, Throbbing Pain Frequency: Constant  Date of Last Visit: 06/29/16 Service Provided on Last Visit: Med Refill  Coagulation Parameters Lab Results  Component Value Date   INR 1.24 12/16/2015   LABPROT 15.8 (H) 12/16/2015   APTT 26 12/16/2015   PLT 191 03/02/2016   Verification of the correct person, correct site (including marking of site), and correct procedure were performed and confirmed by the patient.  Consent: Before the procedure and under the influence of no sedative(s), amnesic(s), or anxiolytics, the patient was informed of the treatment options, risks and possible complications. To fulfill our ethical and legal obligations, as recommended by the American Medical Association's Code of Ethics, I have informed the patient of my clinical impression; the nature and purpose of the treatment or procedure; the risks, benefits, and possible complications of the intervention; the alternatives, including doing nothing; the risk(s) and benefit(s) of the alternative treatment(s) or procedure(s); and the risk(s) and benefit(s) of doing nothing. The patient was provided information about the general risks and possible complications associated with the procedure. These may include, but are not limited to: failure to achieve desired goals, infection, bleeding, organ or nerve damage, allergic reactions, paralysis, and death. In addition, the patient was informed of those risks and complications associated to Spine-related procedures, such as failure to decrease pain; infection (i.e.: Meningitis, epidural or intraspinal abscess); bleeding (i.e.: epidural hematoma, subarachnoid hemorrhage, or any other type of intraspinal or peri-dural bleeding); organ or nerve damage (i.e.: Any type of peripheral nerve, nerve root, or spinal cord injury)  with subsequent damage to sensory, motor, and/or autonomic systems, resulting in permanent pain, numbness, and/or weakness of one  or several areas of the body; allergic reactions; (i.e.: anaphylactic reaction); and/or death. Furthermore, the patient was informed of those risks and complications associated with the medications. These include, but are not limited to: allergic reactions (i.e.: anaphylactic or anaphylactoid reaction(s)); adrenal axis suppression; blood sugar elevation that in diabetics may result in ketoacidosis or comma; water retention that in patients with history of congestive heart failure may result in shortness of breath, pulmonary edema, and decompensation with resultant heart failure; weight gain; swelling or edema; medication-induced neural toxicity; particulate matter embolism and blood vessel occlusion with resultant organ, and/or nervous system infarction; and/or aseptic necrosis of one or more joints. Finally, the patient was informed that Medicine is not an exact science; therefore, there is also the possibility of unforeseen or unpredictable risks and/or possible complications that may result in a catastrophic outcome. The patient indicated having understood very clearly. We have given the patient no guarantees and we have made no promises. Enough time was given to the patient to ask questions, all of which were answered to the patient's satisfaction. Ms. Osterlund has indicated that she wanted to continue with the procedure.  Consent Attestation: I, the ordering provider, attest that I have discussed with the patient the benefits, risks, side-effects, alternatives, likelihood of achieving goals, and potential problems during recovery for the procedure that I have provided informed consent.  Pre-Procedure Preparation:  Safety Precautions: Allergies reviewed. The patient was asked about blood thinners, or active infections, both of which were denied. The patient was asked to confirm  the procedure and laterality, before marking the site, and again before commencing the procedure. Appropriate site, procedure, and patient were confirmed by following the Joint Commission's Universal Protocol (UP.01.01.01), in the form of a "Time Out". The patient was asked to participate by confirming the accuracy of the "Time Out" information. Patient was assessed for positional comfort and pressure points before starting the procedure. Allergies: She is allergic to cyclobenzaprine; flexeril [cyclobenzaprine hcl]; and vancomycin. Allergy Precautions: None required Infection Control Precautions: Sterile technique used. Standard Universal Precautions were taken as recommended by the Department of [facility] for Disease Control and Prevention (CDC). Standard pre-surgical skin prep was conducted. Respiratory hygiene and cough etiquette was practiced. Hand hygiene observed. Safe injection practices and needle disposal techniques followed. SDV (single dose vial) medications used. Medications properly checked for expiration dates and contaminants. Personal protective equipment (PPE) used as per protocol. Monitoring:  As per clinic protocol. Vitals:   07/07/16 1037 07/07/16 1047 07/07/16 1057 07/07/16 1107  BP: 132/74 (!) 164/91 (!) 184/87 (!) 163/84  Pulse:  61 60 60  Resp: '13 14 14 16  '$ Temp:  98.2 F (36.8 C)    TempSrc:      SpO2: 100%  97% 97%  Weight:      Height:      Calculated BMI: Body mass index is 25.1 kg/m. Time-out: "Time-out" completed before starting procedure, as per protocol.  Description of Procedure Process:   Time-out: "Time-out" completed before starting procedure, as per protocol. Position: Prone Target Area: For Lumbar Facet blocks, the target is the groove formed by the junction of the transverse process and superior articular process. For the L5 dorsal ramus, the target is the notch between superior articular process and sacral ala. For the S1 dorsal ramus, the  target is the superior and lateral edge of the posterior S1 Sacral foramen. Approach: Paramedial approach. Area Prepped: Entire Posterior Lumbosacral Region Prepping solution: ChloraPrep (2% chlorhexidine gluconate and 70% isopropyl alcohol)  Safety Precautions: Aspiration looking for blood return was conducted prior to all injections. At no point did we inject any substances, as a needle was being advanced. No attempts were made at seeking any paresthesias. Safe injection practices and needle disposal techniques used. Medications properly checked for expiration dates. SDV (single dose vial) medications used. Description of the Procedure: Protocol guidelines were followed. The patient was placed in position over the fluoroscopy table. The target area was identified and the area prepped in the usual manner. Skin desensitized using vapocoolant spray. Skin & deeper tissues infiltrated with local anesthetic. Appropriate amount of time allowed to pass for local anesthetics to take effect. The procedure needle was introduced through the skin, ipsilateral to the reported pain, and advanced to the target area. Employing the "Medial Branch Technique", the needles were advanced to the angle made by the superior and medial portion of the transverse process, and the lateral and inferior portion of the superior articulating process of the targeted vertebral bodies. This area is known as "Burton's Eye" or the "Eye of the [place] Dog". A procedure needle was introduced through the skin, and this time advanced to the angle made by the superior and medial border of the sacral ala, and the lateral border of the S1 vertebral body. This last needle was later repositioned at the superior and lateral border of the posterior S1 foramen. Negative aspiration confirmed. Solution injected in intermittent fashion, asking for systemic symptoms every 0.5cc of injectate. The needles were then removed and the area cleansed, making sure to  leave some of the prepping solution back to take advantage of its long term bactericidal properties. EBL: None Materials & Medications Used:  Needle(s) Used: 22g - 3.5" Spinal Needle(s)   Illustration of the posterior view of the lumbar spine and the posterior neural structures. Laminae of L2 through S1 are labeled. DPRL5, dorsal primary ramus of L5; DPRS1, dorsal primary ramus of S1; DPR3, dorsal primary ramus of L3; FJ, facet (zygapophyseal) joint L3-L4; I, inferior articular process of L4; LB1, lateral branch of dorsal primary ramus of L1; IAB, inferior articular branches from L3 medial branch (supplies L4-L5 facet joint); IBP, intermediate branch plexus; MB3, medial branch of dorsal primary ramus of L3; NR3, third lumbar nerve root; S, superior articular process of L5; SAB, superior articular branches from L4 (supplies L4-5 facet joint also); TP3, transverse process of L3.  Imaging Guidance (Spinal):  Type of Imaging Technique: Fluoroscopy Guidance (Spinal) Indication(s): Assistance in needle guidance and placement for procedures requiring needle placement in or near specific anatomical locations not easily accessible without such assistance. Exposure Time: Please see nurses notes. Contrast: None used. Fluoroscopic Guidance: I was personally present during the use of fluoroscopy. "Tunnel Vision Technique" used to obtain the best possible view of the target area. Parallax error corrected before commencing the procedure. "Direction-depth-direction" technique used to introduce the needle under continuous pulsed fluoroscopy. Once target was reached, antero-posterior, oblique, and lateral fluoroscopic projection used confirm needle placement in all planes. Images permanently stored in EMR. Interpretation: No contrast injected. I personally interpreted the imaging intraoperatively. Adequate needle placement confirmed in multiple planes. Permanent images saved into the patient's record.  Antibiotic  Prophylaxis:  Indication(s): No indications identified. Type:  Antibiotics Given (last 72 hours)    None      Post-operative Assessment:  Complications: No immediate post-treatment complications observed by team, or reported by patient. Disposition: The patient tolerated the entire procedure well. A repeat set of vitals were taken after the procedure  and the patient was kept under observation following institutional policy, for this type of procedure. Post-procedural neurological assessment was performed, showing return to baseline, prior to discharge. The patient was provided with post-procedure discharge instructions, including a section on how to identify potential problems. Should any problems arise concerning this procedure, the patient was given instructions to immediately contact us, at any time, without hesitation. In any case, we plan to contact the patient by telephone for a follow-up status report regarding this interventional procedure. Comments:  No additional relevant information.  Plan of Care  Discharge to: Discharge home  Medications ordered for procedure: Meds ordered this encounter  Medications  . fentaNYL (SUBLIMAZE) injection 25-50 mcg    Make sure Narcan is available in the pyxis when using this medication. In the event of respiratory depression (RR< 8/min): Titrate NARCAN (naloxone) in increments of 0.1 to 0.2 mg IV at 2-3 minute intervals, until desired degree of reversal.  . lactated ringers infusion 1,000 mL  . midazolam (VERSED) 5 MG/5ML injection 1-2 mg    Make sure Flumazenil is available in the pyxis when using this medication. If oversedation occurs, administer 0.2 mg IV over 15 sec. If after 45 sec no response, administer 0.2 mg again over 1 min; may repeat at 1 min intervals; not to exceed 4 doses (1 mg)  . triamcinolone acetonide (KENALOG-40) injection 40 mg  . lidocaine (PF) (XYLOCAINE) 1 % injection 10 mL  . ropivacaine (PF) 2 mg/ml (0.2%) (NAROPIN)  epidural 9 mL  . ropivacaine (PF) 2 mg/ml (0.2%) (NAROPIN) epidural 9 mL  . triamcinolone acetonide (KENALOG-40) injection 40 mg  . DISCONTD: methylPREDNISolone acetate (DEPO-MEDROL) injection 80 mg  . DISCONTD: lidocaine (PF) (XYLOCAINE) 1 % injection 10 mL  . DISCONTD: ropivacaine (PF) 2 mg/ml (0.2%) (NAROPIN) epidural 9 mL   Medications administered: (For more details, see medical record) We administered triamcinolone acetonide, lidocaine (PF), ropivacaine (PF) 2 mg/ml (0.2%), ropivacaine (PF) 2 mg/ml (0.2%), triamcinolone acetonide, lidocaine (PF), and ropivacaine (PF) 2 mg/ml (0.2%). Lab-work, Procedure(s), & Referral(s) Ordered: Orders Placed This Encounter  Procedures  . LUMBAR FACET(MEDIAL BRANCH NERVE BLOCK) MBNB  . DG C-Arm 1-60 Min-No Report   Imaging Ordered: Results for orders placed during the hospital encounter of 12/24/15  DG C-Arm 1-60 Min-No Report   Narrative CLINICAL DATA: surgery   C-ARM 1-60 MINUTES  Fluoroscopy was utilized by the requesting physician.  No radiographic  interpretation.     New Prescriptions   No medications on file   Physician-requested Follow-up:  Return in about 2 weeks (around 07/21/2016) for Post-Procedure evaluation.  Future Appointments Date Time Provider [place]  07/09/2016 1:40 PM [facility]  07/21/2016 9:15 AM Milinda Pointer, MD [facility] None  09/09/2016 9:30 AM Milinda Pointer, MD [facility] None   Primary Care Physician: Arlis Porta., MD Location: [facility] Note by: Kathlen Brunswick. Dossie Arbour, M.D, DABA, DABAPM, DABPM, DABIPP, FIPP  Disclaimer:  Medicine is not an exact science. The only guarantee in medicine is that nothing is guaranteed. It is important to note that the decision to proceed with this intervention was based on the information collected from the patient. The Data and conclusions were drawn from the patient's questionnaire, the interview, and the  physical examination. Because the information was provided in large part by the patient, it cannot be guaranteed that it has not been purposely or unconsciously manipulated. Every effort has been made to obtain as much relevant data as possible for this  evaluation. It is important to note that the conclusions that lead to this procedure are derived in large part from the available data. Always take into account that the treatment will also be dependent on availability of resources and existing treatment guidelines, considered by other Pain Management Practitioners as being common knowledge and practice, at the time of the intervention. For Medico-Legal purposes, it is also important to point out that variation in procedural techniques and pharmacological choices are the acceptable norm. The indications, contraindications, technique, and results of the above procedure should only be interpreted and judged by a Board-Certified Interventional Pain Specialist with extensive familiarity and expertise in the same exact procedure and technique. Attempts at providing opinions without similar or greater experience and expertise than that of the treating physician will be considered as inappropriate and unethical, and shall result in a formal complaint to the state medical board and applicable specialty societies.  Instructions provided at this appointment: Patient Instructions  Pain Management Discharge Instructions  General Discharge Instructions :  If you need to reach your doctor call: Monday-Friday 8:00 am - 4:00 pm at [phone] or toll free [phone].  After clinic hours [phone] to have operator reach doctor.  Bring all of your medication bottles to all your appointments in the pain clinic.  To cancel or reschedule your appointment with Pain Management please remember to call 24 hours in advance to avoid a fee.  Refer to the educational materials which you have been given on: General Risks, I  had my Procedure. Discharge Instructions, Post Sedation.  Post Procedure Instructions:  The drugs you were given will stay in your system until tomorrow, so for the next 24 hours you should not drive, make any legal decisions or drink any alcoholic beverages.  You may eat anything you prefer, but it is better to start with liquids then soups and crackers, and gradually work up to solid foods.  Please notify your doctor immediately if you have any unusual bleeding, trouble breathing or pain that is not related to your normal pain.  Depending on the type of procedure that was done, some parts of your body may feel week and/or numb.  This usually clears up by tonight or the next day.  Walk with the use of an assistive device or accompanied by an adult for the 24 hours.  You may use ice on the affected area for the first 24 hours.  Put ice in a Ziploc bag and cover with a towel and place against area 15 minutes on 15 minutes off.  You may switch to heat after 24 hours.Facet Joint Block The facet joints connect the bones of the spine (vertebrae). They make it possible for you to bend, twist, and make other movements with your spine. They also prevent you from overbending, overtwisting, and making other excessive movements.  A facet joint block is a procedure where a numbing medicine (anesthetic) is injected into a facet joint. Often, a type of anti-inflammatory medicine called a steroid is also injected. A facet joint block may be done for two reasons:   Diagnosis. A facet joint block may be done as a test to see whether neck or back pain is caused by a worn-down or infected facet joint. If the pain gets better after a facet joint block, it means the pain is probably coming from the facet joint. If the pain does not get better, it means the pain is probably not coming from the facet joint.   Therapy. A facet  joint block may be done to relieve neck or back pain caused by a facet joint. A facet joint  block is only done as a therapy if the pain does not improve with medicine, exercise programs, physical therapy, and other forms of pain management. LET [facility] CARE PROVIDER KNOW ABOUT:   Any allergies you have.   All medicines you are taking, including vitamins, herbs, eyedrops, and over-the-counter medicines and creams.   Previous problems you or members of your family have had with the use of anesthetics.   Any blood disorders you have had.   Other health problems you have. RISKS AND COMPLICATIONS Generally, having a facet joint block is safe. However, as with any procedure, complications can occur. Possible complications associated with having a facet joint block include:   Bleeding.   Injury to a nerve near the injection site.   Pain at the injection site.   Weakness or numbness in areas controlled by nerves near the injection site.   Infection.   Temporary fluid retention.   Allergic reaction to anesthetics or medicines used during the procedure. BEFORE THE PROCEDURE   Follow your health care provider's instructions if you are taking dietary supplements or medicines. You may need to stop taking them or reduce your dosage.   Do not take any new dietary supplements or medicines without asking your health care provider first.   Follow your health care provider's instructions about eating and drinking before the procedure. You may need to stop eating and drinking several hours before the procedure.   Arrange to have an adult drive you home after the procedure. PROCEDURE  You may need to remove your clothing and dress in an open-back gown so that your health care provider can access your spine.   The procedure will be done while you are lying on an X-ray table. Most of the time you will be asked to lie on your stomach, but you may be asked to lie in a different position if an injection will be made in your neck.   Special machines will be used to  monitor your oxygen levels, heart rate, and blood pressure.   If an injection will be made in your neck, an intravenous (IV) tube will be inserted into one of your veins. Fluids and medicine will flow directly into your body through the IV tube.   The area over the facet joint where the injection will be made will be cleaned with an antiseptic soap. The surrounding skin will be covered with sterile drapes.   An anesthetic will be applied to your skin to make the injection area numb. You may feel a temporary stinging or burning sensation.   A video X-ray machine will be used to locate the joint. A contrast dye may be injected into the facet joint area to help with locating the joint.   When the joint is located, an anesthetic medicine will be injected into the joint through the needle.   Your health care provider will ask you whether you feel pain relief. If you do feel relief, a steroid may be injected to provide pain relief for a longer period of time. If you do not feel relief or feel only partial relief, additional injections of an anesthetic may be made in other facet joints.   The needle will be removed, the skin will be cleansed, and bandages will be applied.  AFTER THE PROCEDURE   You will be observed for 15-30 minutes before being allowed to  go home. Do not drive. Have an adult drive you or take a taxi or public transportation instead.   If you feel pain relief, the pain will return in several hours or days when the anesthetic wears off.   You may feel pain relief 2-14 days after the procedure. The amount of time this relief lasts varies from person to person.   It is normal to feel some tenderness over the injected area(s) for 2 days following the procedure.   If you have diabetes, you may have a temporary increase in blood sugar. This information is not intended to replace advice given to you by your health care provider. Make sure you discuss any questions you have  with your health care provider. Document Released: 12/29/2006 Document Revised: 08/30/2014 Document Reviewed: 05/05/2015 Elsevier Interactive Patient Education  2017 [employer] Refer to this sheet in the next few weeks. These instructions provide you with information on caring for yourself after your procedure. Your health care provider may also give you more specific instructions. Your treatment has been planned according to current medical practices, but problems sometimes occur. Call your health care provider if you have any problems or questions after your procedure. HOME CARE INSTRUCTIONS   Keep track of the amount of pain relief you feel and how long it lasts.  Limit pain medicine within the first 4-6 hours after the procedure as directed by your health care provider.  Resume taking dietary supplements and medicines as directed by your health care provider.  You may resume your regular diet.  Do not apply heat near or over the injection site(s) for 24 hours.   Do not take a bath or soak in water (such as a pool or lake) for 24 hours.  Do not drive for 24 hours unless approved by your health care provider.  Avoid strenuous activity for 24 hours.  Remove your bandages the morning after the procedure.   If the injection site is tender, applying an ice pack may relieve some tenderness. To do this:  Put ice in a bag.  Place a towel between your skin and the bag.  Leave the ice on for 15-20 minutes, 3-4 times a day.  Keep follow-up appointments as directed by your health care provider. SEEK MEDICAL CARE IF:   Your pain is not controlled by your medicines.   There is drainage from the injection site.   There is significant bleeding or swelling at the injection site.  You have diabetes and your blood sugar is above 180 mg/dL. SEEK IMMEDIATE MEDICAL CARE IF:   You develop a fever of 101F (38.3C) or greater.   You have worsening  pain or swelling around the injection site.   You have red streaking around the injection site.   You develop severe pain that is not controlled by your medicines.   You develop a headache, stiff neck, nausea, or vomiting.   Your eyes become very sensitive to light.   You have weakness, paralysis, or tingling in your arms or legs that was not present before the procedure.   You develop difficulty urinating or breathing.  This information is not intended to replace advice given to you by your health care provider. Make sure you discuss any questions you have with your health care provider. Document Released: 07/26/2012 Document Revised: 08/30/2014 Document Reviewed: 05/05/2015 Elsevier Interactive Patient Education  2017 [employer].

## 2016-07-08 ENCOUNTER — Telehealth: Payer: Self-pay | Admitting: *Deleted

## 2016-07-08 NOTE — Telephone Encounter (Signed)
No problems post procedure. 

## 2016-07-09 ENCOUNTER — Ambulatory Visit
Admission: RE | Admit: 2016-07-09 | Discharge: 2016-07-09 | Disposition: A | Discharge status: Home / Self Care | Payer: Medicare Other | Source: Ambulatory Visit | Attending: Family Medicine | Admitting: Family Medicine

## 2016-07-09 DIAGNOSIS — Z1231 Encounter for screening mammogram for malignant neoplasm of breast: Secondary | ICD-10-CM

## 2016-07-09 HISTORY — DX: Malignant neoplasm of unspecified site of unspecified female breast: C50.919

## 2016-07-11 DIAGNOSIS — J45998 Other asthma: Secondary | ICD-10-CM | POA: Diagnosis not present

## 2016-07-21 ENCOUNTER — Encounter: Payer: Self-pay | Admitting: Pain Medicine

## 2016-07-21 ENCOUNTER — Ambulatory Visit (HOSPITAL_BASED_OUTPATIENT_CLINIC_OR_DEPARTMENT_OTHER): Payer: Medicare Other | Admitting: Pain Medicine

## 2016-07-21 VITALS — BP 147/72 | HR 75 | Temp 97.9°F | Resp 16 | Ht 69.0 in | Wt 170.0 lb

## 2016-07-21 DIAGNOSIS — G894 Chronic pain syndrome: Secondary | ICD-10-CM

## 2016-07-21 NOTE — Progress Notes (Signed)
Appointment canceled.

## 2016-07-28 ENCOUNTER — Ambulatory Visit
Admission: RE | Admit: 2016-07-28 | Discharge: 2016-07-28 | Disposition: A | Discharge status: Home / Self Care | Payer: Medicare Other | Source: Ambulatory Visit | Attending: Pain Medicine | Admitting: Pain Medicine

## 2016-07-28 ENCOUNTER — Encounter: Payer: Medicare Other | Admitting: Pain Medicine

## 2016-07-28 ENCOUNTER — Ambulatory Visit (HOSPITAL_BASED_OUTPATIENT_CLINIC_OR_DEPARTMENT_OTHER): Payer: Medicare Other | Admitting: Pain Medicine

## 2016-07-28 ENCOUNTER — Encounter: Payer: Self-pay | Admitting: Pain Medicine

## 2016-07-28 VITALS — BP 142/92 | HR 56 | Temp 97.1°F | Resp 16 | Ht 69.0 in | Wt 168.0 lb

## 2016-07-28 DIAGNOSIS — M545 Low back pain: Secondary | ICD-10-CM

## 2016-07-28 DIAGNOSIS — M47816 Spondylosis without myelopathy or radiculopathy, lumbar region: Secondary | ICD-10-CM

## 2016-07-28 DIAGNOSIS — T8484XA Pain due to internal orthopedic prosthetic devices, implants and grafts, initial encounter: Secondary | ICD-10-CM | POA: Insufficient documentation

## 2016-07-28 DIAGNOSIS — M25511 Pain in right shoulder: Secondary | ICD-10-CM | POA: Insufficient documentation

## 2016-07-28 DIAGNOSIS — Z90710 Acquired absence of both cervix and uterus: Secondary | ICD-10-CM | POA: Insufficient documentation

## 2016-07-28 DIAGNOSIS — Z96611 Presence of right artificial shoulder joint: Secondary | ICD-10-CM | POA: Diagnosis not present

## 2016-07-28 DIAGNOSIS — G8929 Other chronic pain: Secondary | ICD-10-CM | POA: Diagnosis not present

## 2016-07-28 DIAGNOSIS — Z9889 Other specified postprocedural states: Secondary | ICD-10-CM | POA: Insufficient documentation

## 2016-07-28 DIAGNOSIS — M47896 Other spondylosis, lumbar region: Secondary | ICD-10-CM

## 2016-07-28 DIAGNOSIS — Y831 Surgical operation with implant of artificial internal device as the cause of abnormal reaction of the patient, or of later complication, without mention of misadventure at the time of the procedure: Secondary | ICD-10-CM | POA: Insufficient documentation

## 2016-07-28 DIAGNOSIS — M1288 Other specific arthropathies, not elsewhere classified, other specified site: Secondary | ICD-10-CM

## 2016-07-28 DIAGNOSIS — M19011 Primary osteoarthritis, right shoulder: Secondary | ICD-10-CM

## 2016-07-28 MED ORDER — ROPIVACAINE HCL 2 MG/ML IJ SOLN
4.0000 mL | Freq: Once | INTRAMUSCULAR | Status: AC
  Administered 2016-07-28: 4 mL
  Filled 2016-07-28: qty 10

## 2016-07-28 MED ORDER — METHYLPREDNISOLONE ACETATE 80 MG/ML IJ SUSP
80.0000 mg | Freq: Once | INTRAMUSCULAR | Status: AC
  Administered 2016-07-28: 80 mg
  Filled 2016-07-28: qty 1

## 2016-07-28 MED ORDER — LIDOCAINE HCL (PF) 1 % IJ SOLN
10.0000 mL | Freq: Once | INTRAMUSCULAR | Status: AC
  Administered 2016-07-28: 10 mL
  Filled 2016-07-28: qty 10

## 2016-07-28 NOTE — Progress Notes (Signed)
Patient's Name: Kristin [name]  MRN: [id number]  Referring Provider: Arlis Porta., MD  DOB: 1930-06-25  PCP: Arlis Porta., MD  DOS: 07/28/2016  Note by: Kathlen Brunswick. Dossie Arbour, MD  Service setting: Ambulatory outpatient  Location: [facility] (AMB) Pain Management Facility  Visit type: Procedure  Specialty: Interventional Pain Management  Patient type: Established   Primary Reason for Visit: Interventional Pain Management Treatment. CC: Shoulder Pain (right)  Procedure:  Anesthesia, Analgesia, Anxiolysis:  Type: Diagnostic Suprascapular nerve Block Region: Posterior Shoulder & Scapular Areas Level: Superior to the scapular spine, in the lateral aspect of the supraspinatus fossa (Suprescapular notch). Laterality: Right-Side  Type: Local Anesthesia Local Anesthetic: Lidocaine 1% Route: Infiltration (/IM) IV Access: Declined Sedation: Declined  Indication(s): Analgesia          Indications: 1. Chronic shoulder pain (Right)   2. Primary osteoarthritis of right shoulder   3. 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 (year old), female patient, seen today for interventional treatment. She  has a past surgical history that includes Abdominal hysterectomy; Back surgery; Rotator cuff repair; Breast surgery; Cataract extraction w/PHACO (10/19/2011); Cataract extraction w/PHACO (11/02/2011); Carpal tunnel release; Joint replacement; Colon surgery; and Pacemaker insertion (N/A, 12/24/2015).. Her primarily concern today is the Shoulder Pain (right) The primary encounter diagnosis was Chronic shoulder pain (Right). Diagnoses of Primary osteoarthritis of right shoulder and Pain due to right shoulder joint prosthesis ([place]) were also pertinent to this visit.  Pain Type: Chronic pain Pain Location: Shoulder Pain Orientation: Right Pain Descriptors / Indicators: Aching, Constant,  Throbbing (unable to lift arm) Pain Frequency: Constant  Date of Last Visit: 06/29/16 Service Provided on Last Visit: Med Refill  Coagulation Parameters Lab Results  Component Value Date   INR 1.24 12/16/2015   LABPROT 15.8 (H) 12/16/2015   APTT 26 12/16/2015   PLT 191 03/02/2016   Verification of the correct person, correct site (including marking of site), and correct procedure were performed and confirmed by the patient.  Consent: Before the procedure and under the influence of no sedative(s), amnesic(s), or anxiolytics, the patient was informed of the treatment options, risks and possible complications. To fulfill our ethical and legal obligations, as recommended by the American Medical Association's Code of Ethics, I have informed the patient of my clinical impression; the nature and purpose of the treatment or procedure; the risks, benefits, and possible complications of the intervention; the alternatives, including doing nothing; the risk(s) and benefit(s) of the alternative treatment(s) or procedure(s); and the risk(s) and benefit(s) of doing nothing. The patient was provided information about the general risks and possible complications associated with the procedure. These may include, but are not limited to: failure to achieve desired goals, infection, bleeding, organ or nerve damage, allergic reactions, paralysis, and death. In addition, the patient was informed of those risks and complications associated to the procedure, such as failure to decrease pain; infection; bleeding; organ or nerve damage with subsequent damage to sensory, motor, and/or autonomic systems, resulting in permanent pain, numbness, and/or weakness of one or several areas of the body; allergic reactions; (i.e.: anaphylactic reaction); and/or death. Furthermore, the patient was informed of those risks and complications associated with the medications. These include, but are not limited to: allergic reactions (i.e.:  anaphylactic or anaphylactoid reaction(s)); adrenal axis suppression; blood sugar elevation that in diabetics may result in ketoacidosis or comma;  water retention that in patients with history of congestive heart failure may result in shortness of breath, pulmonary edema, and decompensation with resultant heart failure; weight gain; swelling or edema; medication-induced neural toxicity; particulate matter embolism and blood vessel occlusion with resultant organ, and/or nervous system infarction; and/or aseptic necrosis of one or more joints. Finally, the patient was informed that Medicine is not an exact science; therefore, there is also the possibility of unforeseen or unpredictable risks and/or possible complications that may result in a catastrophic outcome. The patient indicated having understood very clearly. We have given the patient no guarantees and we have made no promises. Enough time was given to the patient to ask questions, all of which were answered to the patient's satisfaction. Kristin [name] has indicated that she wanted to continue with the procedure.  Consent Attestation: I, the ordering provider, attest that I have discussed with the patient the benefits, risks, side-effects, alternatives, likelihood of achieving goals, and potential problems during recovery for the procedure that I have provided informed consent.  Pre-Procedure Preparation:  Safety Precautions: Allergies reviewed. The patient was asked about blood thinners, or active infections, both of which were denied. The patient was asked to confirm the procedure and laterality, before marking the site, and again before commencing the procedure. Appropriate site, procedure, and patient were confirmed by following the Joint Commission's Universal Protocol (UP.01.01.01), in the form of a "Time Out". The patient was asked to participate by confirming the accuracy of the "Time Out" information. Patient was assessed for positional comfort and  pressure points before starting the procedure. Allergies: She is allergic to cyclobenzaprine; flexeril [cyclobenzaprine hcl]; and vancomycin. Allergy Precautions: None required Infection Control Precautions: Sterile technique used. Standard Universal Precautions were taken as recommended by the Department of [facility] for Disease Control and Prevention (CDC). Standard pre-surgical skin prep was conducted. Respiratory hygiene and cough etiquette was practiced. Hand hygiene observed. Safe injection practices and needle disposal techniques followed. SDV (single dose vial) medications used. Medications properly checked for expiration dates and contaminants. Personal protective equipment (PPE) used as per protocol. Monitoring:  As per clinic protocol. Vitals:   07/28/16 1122 07/28/16 1127 07/28/16 1133 07/28/16 1139  BP: (!) 126/99 (!) 145/78 (!) 147/94 (!) 142/92  Pulse: 62 68 78 (!) 56  Resp: '16 16 16 16  '$ Temp:      TempSrc:      SpO2: 98% 98% 98% 98%  Weight:      Height:      Calculated BMI: Body mass index is 24.81 kg/m. Time-out: "Time-out" completed before starting procedure, as per protocol.  Description of Procedure Process:   Time-out: "Time-out" completed before starting procedure, as per protocol. Position: Prone", Target Area: Suprascapular notch. Approach: Posterior approach. Area Prepped: Entire shoulder Area Prepping solution: ChloraPrep (2% chlorhexidine gluconate and 70% isopropyl alcohol) Safety Precautions: Aspiration looking for blood return was conducted prior to all injections. At no point did we inject any substances, as a needle was being advanced. No attempts were made at seeking any paresthesias. Safe injection practices and needle disposal techniques used. Medications properly checked for expiration dates. SDV (single dose vial) medications used. Description of the Procedure: Protocol guidelines were followed. The patient was placed in position over the  procedure table. The target area was identified and the area prepped in the usual manner. Skin & deeper tissues infiltrated with local anesthetic. Appropriate amount of time allowed to pass for local anesthetics to take effect. The procedure needles were then advanced  to the target area. Proper needle placement secured. Negative aspiration confirmed. Solution injected in intermittent fashion, asking for systemic symptoms every 0.5cc of injectate. The needles were then removed and the area cleansed, making sure to leave some of the prepping solution back to take advantage of its long term bactericidal properties. EBL: None Materials & Medications Used:  Needle(s) Used: 22g - 3.5" Spinal Needle(s) Solution Injected: 0.2% PF-Ropivacaine (83m) + SDV-Decadron 10 mg/ml (198m Medications Administered today: We administered methylPREDNISolone acetate, lidocaine (PF), and ropivacaine (PF) 2 mg/mL (0.2%).Please see chart orders for dosing details.  Imaging Guidance (Non-Spinal):  Type of Imaging Technique: Fluoroscopy Guidance (Non-Spinal) Indication(s): Assistance in needle guidance and placement for procedures requiring needle placement in or near specific anatomical locations not easily accessible without such assistance. Exposure Time: Please see nurses notes. Contrast: None used. Fluoroscopic Guidance: I was personally present during the use of fluoroscopy. "Tunnel Vision Technique" used to obtain the best possible view of the target area. Parallax error corrected before commencing the procedure. "Direction-depth-direction" technique used to introduce the needle under continuous pulsed fluoroscopy. Once target was reached, antero-posterior, oblique, and lateral fluoroscopic projection used confirm needle placement in all planes. Images permanently stored in EMR. Interpretation: No contrast injected. I personally interpreted the imaging intraoperatively. Adequate needle placement confirmed in multiple planes.  Permanent images saved into the patient's record.  Antibiotic Prophylaxis:  Indication(s): No indications identified. Type:  Antibiotics Given (last 72 hours)    None      Post-operative Assessment:  Complications: No immediate post-treatment complications observed by team, or reported by patient. Disposition: The patient tolerated the entire procedure well. A repeat set of vitals were taken after the procedure and the patient was kept under observation following institutional policy, for this type of procedure. Post-procedural neurological assessment was performed, showing return to baseline, prior to discharge. The patient was provided with post-procedure discharge instructions, including a section on how to identify potential problems. Should any problems arise concerning this procedure, the patient was given instructions to immediately contact us[place]oreaat any time, without hesitation. In any case, we plan to contact the patient by telephone for a follow-up status report regarding this interventional procedure. Comments:  No additional relevant information.  Plan of Care  Discharge to: Discharge home  Medications ordered for procedure: Meds ordered this encounter  Medications  . methylPREDNISolone acetate (DEPO-MEDROL) injection 80 mg  . lidocaine (PF) (XYLOCAINE) 1 % injection 10 mL  . ropivacaine (PF) 2 mg/mL (0.2%) (NAROPIN) injection 4 mL   Medications administered: (For more details, see medical record) We administered methylPREDNISolone acetate, lidocaine (PF), and ropivacaine (PF) 2 mg/mL (0.2%). Lab-work, Procedure(s), & Referral(s) Ordered: Orders Placed This Encounter  Procedures  . SUPRASCAPULAR NERVE BLOCK  . DG C-Arm 1-60 Min-No Report   Imaging Ordered: Results for orders placed in visit on 07/07/16  DG C-Arm 1-60 Min-No Report   Narrative Fluoroscopy was utilized by the requesting physician.  No radiographic  interpretation.    New Prescriptions   No medications on  file   Physician-requested Follow-up:  Return in about 2 weeks (around 08/11/2016) for Post-Procedure evaluation.  Future Appointments Date Time Provider [place]1/18/2018 9:30 AM FrMilinda PointerMD [facility]   Primary Care Physician: JaArlis Porta MD [facility] Note by: FrKathlen BrunswickNaDossie ArbourM.D, DABA, DABAPM, DABPM, DABIPP, FIPP Date: 07/28/16; Time: 2:55 PM  Disclaimer:  Medicine is not an exact science. The only guarantee in medicine is that nothing  is guaranteed. It is important to note that the decision to proceed with this intervention was based on the information collected from the patient. The Data and conclusions were drawn from the patient's questionnaire, the interview, and the physical examination. Because the information was provided in large part by the patient, it cannot be guaranteed that it has not been purposely or unconsciously manipulated. Every effort has been made to obtain as much relevant data as possible for this evaluation. It is important to note that the conclusions that lead to this procedure are derived in large part from the available data. Always take into account that the treatment will also be dependent on availability of resources and existing treatment guidelines, considered by other Pain Management Practitioners as being common knowledge and practice, at the time of the intervention. For Medico-Legal purposes, it is also important to point out that variation in procedural techniques and pharmacological choices are the acceptable norm. The indications, contraindications, technique, and results of the above procedure should only be interpreted and judged by a Board-Certified Interventional Pain Specialist with extensive familiarity and expertise in the same exact procedure and technique. Attempts at providing opinions without similar or greater experience and expertise than that of the treating  physician will be considered as inappropriate and unethical, and shall result in a formal complaint to the state medical board and applicable specialty societies.  Instructions provided at this appointment: Patient Instructions  Pain Management Discharge Instructions  General Discharge Instructions :  If you need to reach your doctor call: Monday-Friday 8:00 am - 4:00 pm at [phone] or toll free [phone].  After clinic hours [phone] to have operator reach doctor.  Bring all of your medication bottles to all your appointments in the pain clinic.  To cancel or reschedule your appointment with Pain Management please remember to call 24 hours in advance to avoid a fee.  Refer to the educational materials which you have been given on: General Risks, I had my Procedure. Discharge Instructions, Post Sedation.  Post Procedure Instructions:  The drugs you were given will stay in your system until tomorrow, so for the next 24 hours you should not drive, make any legal decisions or drink any alcoholic beverages.  You may eat anything you prefer, but it is better to start with liquids then soups and crackers, and gradually work up to solid foods.  Please notify your doctor immediately if you have any unusual bleeding, trouble breathing or pain that is not related to your normal pain.  Depending on the type of procedure that was done, some parts of your body may feel week and/or numb.  This usually clears up by tonight or the next day.  Walk with the use of an assistive device or accompanied by an adult for the 24 hours.  You may use ice on the affected area for the first 24 hours.  Put ice in a Ziploc bag and cover with a towel and place against area 15 minutes on 15 minutes off.  You may switch to heat after 24 hours.Trigger Point Injection Trigger points are areas where you have pain. A trigger point injection is a shot given in the trigger point to help relieve pain for a few days  to a few months. Common places for trigger points include:  The neck.  The shoulders.  The upper back.  The lower back. A trigger point injection will not cure long-lasting (chronic) pain permanently. These injections do not always work for every person, but  for some people they can help to relieve pain for a few days to a few months. Tell a health care provider about:  Any allergies you have.  All medicines you are taking, including vitamins, herbs, eye drops, creams, and over-the-counter medicines.  Any problems you or family members have had with anesthetic medicines.  Any blood disorders you have.  Any surgeries you have had.  Any medical conditions you have. What are the risks? Generally, this is a safe procedure. However, problems may occur, including:  Infection.  Bleeding.  Allergic reaction to the injected medicine.  Irritation of the skin around the injection site. What happens before the procedure?  Ask your health care provider about changing or stopping your regular medicines. This is especially important if you are taking diabetes medicines or blood thinners. What happens during the procedure?  Your health care provider will feel for trigger points. A marker may be used to circle the area for the injection.  The skin over the trigger point will be washed with a germ-killing (antiseptic) solution.  A thin needle is used for the shot. You may feel pain or a twitching feeling when the needle enters the trigger point.  A numbing solution may be injected into the trigger point. Sometimes a medicine to keep down swelling, redness, and warmth (inflammation) is also injected.  Your health care provider may move the needle around the area where the trigger point is located until the tightness and twitching goes away.  After the injection, your health care provider may put gentle pressure over the injection site.  The injection site will be covered with a bandage  (dressing). The procedure may vary among health care providers and hospitals. What happens after the procedure?  The dressing can be taken off in a few hours or as told by your health care provider.  You may feel sore and stiff for 1-2 days. This information is not intended to replace advice given to you by your health care provider. Make sure you discuss any questions you have with your health care provider. Document Released: 07/29/2011 Document Revised: 04/11/2016 Document Reviewed: 01/27/2015 Elsevier Interactive Patient Education  2017 [employer].

## 2016-07-28 NOTE — Patient Instructions (Signed)
Pain Management Discharge Instructions  General Discharge Instructions :  If you need to reach your doctor call: Monday-Friday 8:00 am - 4:00 pm at [phone] or toll free [phone].  After clinic hours [phone] to have operator reach doctor.  Bring all of your medication bottles to all your appointments in the pain clinic.  To cancel or reschedule your appointment with Pain Management please remember to call 24 hours in advance to avoid a fee.  Refer to the educational materials which you have been given on: General Risks, I had my Procedure. Discharge Instructions, Post Sedation.  Post Procedure Instructions:  The drugs you were given will stay in your system until tomorrow, so for the next 24 hours you should not drive, make any legal decisions or drink any alcoholic beverages.  You may eat anything you prefer, but it is better to start with liquids then soups and crackers, and gradually work up to solid foods.  Please notify your doctor immediately if you have any unusual bleeding, trouble breathing or pain that is not related to your normal pain.  Depending on the type of procedure that was done, some parts of your body may feel week and/or numb.  This usually clears up by tonight or the next day.  Walk with the use of an assistive device or accompanied by an adult for the 24 hours.  You may use ice on the affected area for the first 24 hours.  Put ice in a Ziploc bag and cover with a towel and place against area 15 minutes on 15 minutes off.  You may switch to heat after 24 hours.Trigger Point Injection Trigger points are areas where you have pain. A trigger point injection is a shot given in the trigger point to help relieve pain for a few days to a few months. Common places for trigger points include:  The neck.  The shoulders.  The upper back.  The lower back. A trigger point injection will not cure long-lasting (chronic) pain permanently. These injections do not  always work for every person, but for some people they can help to relieve pain for a few days to a few months. Tell a health care provider about:  Any allergies you have.  All medicines you are taking, including vitamins, herbs, eye drops, creams, and over-the-counter medicines.  Any problems you or family members have had with anesthetic medicines.  Any blood disorders you have.  Any surgeries you have had.  Any medical conditions you have. What are the risks? Generally, this is a safe procedure. However, problems may occur, including:  Infection.  Bleeding.  Allergic reaction to the injected medicine.  Irritation of the skin around the injection site. What happens before the procedure?  Ask your health care provider about changing or stopping your regular medicines. This is especially important if you are taking diabetes medicines or blood thinners. What happens during the procedure?  Your health care provider will feel for trigger points. A marker may be used to circle the area for the injection.  The skin over the trigger point will be washed with a germ-killing (antiseptic) solution.  A thin needle is used for the shot. You may feel pain or a twitching feeling when the needle enters the trigger point.  A numbing solution may be injected into the trigger point. Sometimes a medicine to keep down swelling, redness, and warmth (inflammation) is also injected.  Your health care provider may move the needle around the area where the trigger  point is located until the tightness and twitching goes away.  After the injection, your health care provider may put gentle pressure over the injection site.  The injection site will be covered with a bandage (dressing). The procedure may vary among health care providers and hospitals. What happens after the procedure?  The dressing can be taken off in a few hours or as told by your health care provider.  You may feel sore and stiff  for 1-2 days. This information is not intended to replace advice given to you by your health care provider. Make sure you discuss any questions you have with your health care provider. Document Released: 07/29/2011 Document Revised: 04/11/2016 Document Reviewed: 01/27/2015 Elsevier Interactive Patient Education  2017 [employer].

## 2016-07-28 NOTE — Progress Notes (Signed)
Safety precautions to be maintained throughout the outpatient stay will include: orient to surroundings, keep bed in low position, maintain call bell within reach at all times, provide assistance with transfer out of bed and ambulation.  

## 2016-07-29 ENCOUNTER — Telehealth: Payer: Self-pay

## 2016-07-29 NOTE — Telephone Encounter (Signed)
Denies any needs- States shoulder is a lot better

## 2016-08-03 DIAGNOSIS — I495 Sick sinus syndrome: Secondary | ICD-10-CM | POA: Diagnosis not present

## 2016-08-05 DIAGNOSIS — M79641 Pain in right hand: Secondary | ICD-10-CM | POA: Diagnosis not present

## 2016-08-05 DIAGNOSIS — M79642 Pain in left hand: Secondary | ICD-10-CM | POA: Diagnosis not present

## 2016-08-10 DIAGNOSIS — J45998 Other asthma: Secondary | ICD-10-CM | POA: Diagnosis not present

## 2016-08-22 ENCOUNTER — Encounter: Payer: Self-pay | Admitting: Emergency Medicine

## 2016-08-22 ENCOUNTER — Emergency Department: Payer: Medicare Other

## 2016-08-22 ENCOUNTER — Emergency Department
Admission: EM | Admit: 2016-08-22 | Discharge: 2016-08-23 | Disposition: A | Discharge status: Home / Self Care | Payer: Medicare Other | Attending: Emergency Medicine | Admitting: Emergency Medicine

## 2016-08-22 DIAGNOSIS — I5032 Chronic diastolic (congestive) heart failure: Secondary | ICD-10-CM | POA: Insufficient documentation

## 2016-08-22 DIAGNOSIS — X58XXXA Exposure to other specified factors, initial encounter: Secondary | ICD-10-CM | POA: Diagnosis not present

## 2016-08-22 DIAGNOSIS — J449 Chronic obstructive pulmonary disease, unspecified: Secondary | ICD-10-CM | POA: Diagnosis not present

## 2016-08-22 DIAGNOSIS — Y999 Unspecified external cause status: Secondary | ICD-10-CM | POA: Diagnosis not present

## 2016-08-22 DIAGNOSIS — M25512 Pain in left shoulder: Secondary | ICD-10-CM | POA: Diagnosis not present

## 2016-08-22 DIAGNOSIS — Z87891 Personal history of nicotine dependence: Secondary | ICD-10-CM | POA: Insufficient documentation

## 2016-08-22 DIAGNOSIS — Z7982 Long term (current) use of aspirin: Secondary | ICD-10-CM | POA: Insufficient documentation

## 2016-08-22 DIAGNOSIS — Z853 Personal history of malignant neoplasm of breast: Secondary | ICD-10-CM | POA: Insufficient documentation

## 2016-08-22 DIAGNOSIS — Y9389 Activity, other specified: Secondary | ICD-10-CM | POA: Insufficient documentation

## 2016-08-22 DIAGNOSIS — S46912A Strain of unspecified muscle, fascia and tendon at shoulder and upper arm level, left arm, initial encounter: Secondary | ICD-10-CM | POA: Insufficient documentation

## 2016-08-22 DIAGNOSIS — N183 Chronic kidney disease, stage 3 (moderate): Secondary | ICD-10-CM | POA: Diagnosis not present

## 2016-08-22 DIAGNOSIS — R031 Nonspecific low blood-pressure reading: Secondary | ICD-10-CM | POA: Diagnosis not present

## 2016-08-22 DIAGNOSIS — I130 Hypertensive heart and chronic kidney disease with heart failure and stage 1 through stage 4 chronic kidney disease, or unspecified chronic kidney disease: Secondary | ICD-10-CM | POA: Insufficient documentation

## 2016-08-22 DIAGNOSIS — Y929 Unspecified place or not applicable: Secondary | ICD-10-CM | POA: Diagnosis not present

## 2016-08-22 DIAGNOSIS — Z79899 Other long term (current) drug therapy: Secondary | ICD-10-CM | POA: Insufficient documentation

## 2016-08-22 DIAGNOSIS — S4992XA Unspecified injury of left shoulder and upper arm, initial encounter: Secondary | ICD-10-CM | POA: Diagnosis present

## 2016-08-22 HISTORY — DX: Pain in right shoulder: M25.511

## 2016-08-22 HISTORY — DX: Pain in left shoulder: M25.512

## 2016-08-22 NOTE — ED Triage Notes (Signed)
Pt states has had both shoulders replaced, has chronic shoulder pain in both. Pain increased with usage this afternoon. Takes oxycodone 3 x daily and last took at 5pm.

## 2016-08-22 NOTE — ED Triage Notes (Signed)
Pt was taking down christmas lights this afternoon and began to have left shoulder pain that increases with movement. No other complaints. vss.

## 2016-08-23 MED ORDER — KETOROLAC TROMETHAMINE 30 MG/ML IJ SOLN
INTRAMUSCULAR | Status: AC
  Filled 2016-08-23: qty 1

## 2016-08-23 MED ORDER — KETOROLAC TROMETHAMINE 30 MG/ML IJ SOLN
30.0000 mg | Freq: Once | INTRAMUSCULAR | Status: AC
  Administered 2016-08-23: 30 mg via INTRAVENOUS

## 2016-08-23 MED ORDER — LIDOCAINE 5 % EX PTCH: 1.0000 | MEDICATED_PATCH | CUTANEOUS | 0 refills | Status: DC

## 2016-08-23 MED ORDER — LIDOCAINE 5 % EX PTCH
MEDICATED_PATCH | CUTANEOUS | Status: AC
  Filled 2016-08-23: qty 1

## 2016-08-23 MED ORDER — LIDOCAINE 5 % EX PTCH
2.0000 | MEDICATED_PATCH | CUTANEOUS | Status: DC
  Administered 2016-08-23: 2 via TRANSDERMAL

## 2016-08-23 NOTE — ED Provider Notes (Signed)
[facility] Emergency Department Provider Note    First MD Initiated Contact with Patient 08/22/16 2311     (approximate)  I have reviewed the triage vital signs and the nursing notes.   HISTORY  Chief Complaint Shoulder Pain   HPI Kristin PYPER is a 83 y.o. female with history of bilateral shoulder replacement as well as chronic shoulder pain. Patient also has a history of diffuse arthritis in her spine for which she takes 20 mg of oxycodone every 8 hours last dose at 5 PM today presents to the emergency department with left shoulder pain worse with movement. Patient states pain began after taking down Christmas lights today. Patient denies any chest pain no shortness of breath.   Past Medical History:  Diagnosis Date  . Arthritis   . Arthritis   . Atrial fibrillation ([place])   . Back pain    lower back chronic  . Bradycardia   . Breast cancer ([place]) 2004   right breast cancer  . Cancer [facility]) 2004   rt breast cancer-post lumpectomy- chemo/rad  . CHF (congestive heart failure) ([place])   . Chronic back pain   . CKD (chronic kidney disease)   . COPD (chronic obstructive pulmonary disease) (HCC)    wears O2 at 2L via [place] at night  . Cystocele   . Decubitus ulcers   . Gout   . Hematuria   . Hepatitis C   . Hyperlipidemia   . Hypertension   . Hypomagnesemia 06/25/2015  . OAB (overactive bladder)   . Overactive bladder   . Restless leg   . Shoulder pain, bilateral   . Urinary frequency   . UTI (lower urinary tract infection)   . Vaginal atrophy     Patient Active Problem List   Diagnosis Date Noted  . Osteoarthritis of shoulder (Right) 07/28/2016  . Shoulder pain S/P prostheses (Right) 07/28/2016  . Chronic shoulder pain (S/P replacement) (Right) 06/14/2016  . Osteoarthritis, multiple sites 06/14/2016  . History of cardiac pacemaker in situ 01/07/2016  . Sick sinus syndrome ([place]) 12/24/2015  . GERD (gastroesophageal reflux disease) 12/18/2015    . Near syncope 10/29/2015  . New onset atrial flutter ([place]) 10/29/2015  . UTI (lower urinary tract infection) 10/29/2015  . Chronic pain syndrome 06/25/2015  . Chronic low back pain (Location of Primary Source of Pain) (Bilateral) (L>R) 06/25/2015  . Lumbar facet syndrome (Bilateral) (L>R) 06/25/2015  . Lumbar spondylosis 06/25/2015  . Chronic lumbar radicular pain (Bilateral) (L>R) (L5 Dermatome) 06/25/2015  . Restless leg syndrome 06/25/2015  . Myofascial pain 06/25/2015  . Neuropathic pain 06/25/2015  . Neurogenic pain 06/25/2015  . Long term current use of opiate analgesic 06/25/2015  . Long term prescription opiate use 06/25/2015  . Opiate use (90 MME/Day) 06/25/2015  . Encounter for therapeutic drug level monitoring 06/25/2015  . Chronic constipation 06/25/2015  . Opioid-induced constipation (OIC) 06/25/2015  . Vitamin D insufficiency 06/25/2015  . Grade 1 Retrolisthesis of L1 over L2 06/25/2015  . Lumbar facet hypertrophy (L1-2, L2-3, and L4-5) 06/25/2015  . Stenosis of lateral recess of lumbar spine (L1-2 and L4-5) 06/25/2015  . 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 Epidural fibrosis 06/25/2015  . Lumbar postlaminectomy syndrome 06/25/2015  . Chronic hip pain (bilateral) (L>R) 06/25/2015  . 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) 06/25/2015  . 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) 06/25/2015  . Hypomagnesemia 06/25/2015    Class: History of  . Osteoporosis  06/25/2015  . Chronic sacroiliac joint pain (Left) 06/25/2015  . MRSA (methicillin resistant staph aureus) culture positive 06/25/2015  . Mitral valve prolapse 06/25/2015  . 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 Need for influenza vaccination 06/17/2015  . Frequency of urination 06/17/2015  . Seasonal allergies 06/17/2015  . Breast cancer, right breast ([place]) 06/17/2015  . Urinary frequency 04/11/2015  . BP (high blood pressure) 03/03/2015  . Upper  respiratory infection, viral 01/02/2015  . Chronic diastolic heart failure ([place]) 12/26/2014  . Abnormal glucose tolerance test 09/16/2014  . CAFL (chronic airflow limitation) ([place]) 09/16/2014  . Essential (primary) hypertension 09/16/2014  . Chronic obstructive pulmonary disease ([place]) 09/16/2014  . H/O cardiac catheterization 04/04/2014  . History of cardiac catheterization 04/04/2014  . Complete rotator cuff rupture of shoulder (Left) 04/03/2014  . CCF (congestive cardiac failure) ([place]) 04/03/2014  . DDD (degenerative disc disease), lumbosacral 04/03/2014  . Benign essential HTN 04/03/2014  . Arrhythmia, sinus node ([place]) 04/03/2014  . Lumbar canal stenosis 04/03/2014  . Congestive heart failure ([place]) 04/03/2014  . Degeneration of intervertebral disc of lumbosacral region 04/03/2014  . Sinoatrial node dysfunction ([place]) 04/03/2014  . Rotator cuff syndrome 04/03/2014  . Disorder of rotator cuff 04/03/2014  . Chronic kidney disease (CKD), stage III (moderate) 10/30/2013  . Gout 10/30/2013    Past Surgical History:  Procedure Laterality Date  . ABDOMINAL HYSTERECTOMY    . BACK SURGERY    . BREAST SURGERY     rt lumpectomy  . CARPAL TUNNEL RELEASE    . CATARACT EXTRACTION W/PHACO  10/19/2011   Procedure: CATARACT EXTRACTION PHACO AND INTRAOCULAR LENS PLACEMENT (IOC);  Surgeon: Elta Guadeloupe T. Gershon Crane, MD;  Location: [facility];  Service: Ophthalmology;  Laterality: Right;  CDE:10.81  . CATARACT EXTRACTION W/PHACO  11/02/2011   Procedure: CATARACT EXTRACTION PHACO AND INTRAOCULAR LENS PLACEMENT (IOC);  Surgeon: Elta Guadeloupe T. Gershon Crane, MD;  Location: [facility];  Service: Ophthalmology;  Laterality: Left;  CDE 8.60  . COLON SURGERY    . JOINT REPLACEMENT     bilateral TKA  . PACEMAKER INSERTION N/A 12/24/2015   Procedure: INSERTION PACEMAKER;  Surgeon: Isaias Cowman, MD;  Location: [facility] ORS;  Service: Cardiovascular;  Laterality: N/A;  . ROTATOR CUFF REPAIR     bilateral    Prior to Admission  medications   Medication Sig Start Date End Date Taking? Authorizing Provider  albuterol (PROVENTIL) (2.5 MG/3ML) 0.083% nebulizer solution Take 3 mLs (2.5 mg total) by nebulization every 8 (eight) hours as needed. Shortness of breath and wheezing 02/19/16   Arlis Porta., MD  apixaban (ELIQUIS) 5 MG TABS tablet Take 1 tablet (5 mg total) by mouth 2 (two) times daily. 10/30/15   Loletha Grayer, MD  aspirin EC 325 MG tablet Take 325 mg by mouth daily.     Historical Provider, MD  bisacodyl (DULCOLAX) 5 MG EC tablet Take 2 tablets (10 mg total) by mouth daily as needed for moderate constipation. 03/17/16   Milinda Pointer, MD  calcium citrate-vitamin D (CITRACAL+D) 315-200 MG-UNIT per tablet Take 1 tablet by mouth 2 (two) times daily.    Historical Provider, MD  docusate calcium (SURFAK) 240 MG capsule Take 240 mg by mouth daily.     Historical Provider, MD  furosemide (LASIX) 20 MG tablet Take 1 tablet (20 mg total) by mouth daily. 02/19/16   Arlis Porta., MD  levocetirizine (XYZAL) 5 MG tablet Take 1 tablet (5 mg total) by mouth every evening. 06/22/16   Coralie Common  Brooke Bonito., MD  lisinopril (PRINIVIL,ZESTRIL) 10 MG tablet Take 1 tablet (10 mg total) by mouth daily. 02/19/16   Arlis Porta., MD  magnesium oxide (MAG-OX) 400 MG tablet Take 1 tablet (400 mg total) by mouth daily. 03/17/16   Milinda Pointer, MD  omeprazole (PRILOSEC) 20 MG capsule Take 1 capsule (20 mg total) by mouth daily. 02/19/16   Arlis Porta., MD  Oxycodone HCl 20 MG TABS Take 1 tablet (20 mg total) by mouth every 8 (eight) hours as needed. 06/15/16 07/15/16  Milinda Pointer, MD  Oxycodone HCl 20 MG TABS Take 1 tablet (20 mg total) by mouth every 8 (eight) hours as needed. 07/15/16 08/14/16  Milinda Pointer, MD  Oxycodone HCl 20 MG TABS Take 1 tablet (20 mg total) by mouth every 8 (eight) hours as needed. 08/14/16 09/13/16  Milinda Pointer, MD  OXYGEN Inhale 2 Doses into the lungs at bedtime.     Historical Provider, MD  rOPINIRole (REQUIP) 2 MG tablet Take 1 tablet (2 mg total) by mouth at bedtime. 06/29/16 09/13/16  Milinda Pointer, MD  Wheat Dextrin (BENEFIBER DRINK MIX) PACK Stir 2 teaspoons of Benefiber into 4-8 oz of any non-carbonated beverage or soft food (hot or cold) PO TID as needed 03/17/16   Historical Provider, MD    Allergies Cyclobenzaprine; Flexeril [cyclobenzaprine hcl]; and Vancomycin  Family History  Problem Relation Age of Onset  . Kidney disease Brother   . Heart disease Father   . Heart disease Mother   . Stroke Mother   . Cancer Sister   . Breast cancer Sister 3  . Breast cancer Paternal Aunt   . Anesthesia problems Neg Hx   . Hypotension Neg Hx   . Malignant hyperthermia Neg Hx   . Pseudochol deficiency Neg Hx     Social History Social History  Substance Use Topics  . Smoking status: Former Smoker    Packs/day: 1.25    Years: 40.00    Types: Cigarettes    Quit date: 12/16/1998  . Smokeless tobacco: Never Used  . Alcohol use No    Review of Systems Constitutional: No fever/chills Eyes: No visual changes. ENT: No sore throat. Cardiovascular: Denies chest pain. Respiratory: Denies shortness of breath. Gastrointestinal: No abdominal pain.  No nausea, no vomiting.  No diarrhea.  No constipation. Genitourinary: Negative for dysuria. Musculoskeletal: Negative for back pain.Positive for left shoulder pain Skin: Negative for rash. Neurological: Negative for headaches, focal weakness or numbness.  10-point ROS otherwise negative.  ____________________________________________   PHYSICAL EXAM:  VITAL SIGNS: ED Triage Vitals [08/22/16 2145]  Enc Vitals Group     BP      Pulse      Resp      Temp      Temp src      SpO2      Weight      Height      Head Circumference      Peak Flow      Pain Score 5     Pain Loc      Pain Edu?      Excl. in [place]?     Constitutional: Alert and oriented. Well appearing and in no acute  distress. Eyes: Conjunctivae are normal. PERRL. EOMI. Head: Atraumatic. Mouth/Throat: Mucous membranes are moist.  Oropharynx non-erythematous. Neck: No stridor.   Cardiovascular: Normal rate, regular rhythm. Good peripheral circulation. Grossly normal heart sounds. Respiratory: Normal respiratory effort.  No retractions. Lungs CTAB. Gastrointestinal: Soft and nontender.  No distention.  Musculoskeletal: No lower extremity tenderness nor edema. End palpation anterior left shoulder  Neurologic:  Normal speech and language. No gross focal neurologic deficits are appreciated.  Skin:  Skin is warm, dry and intact. No rash noted. Psychiatric: Mood and affect are normal. Speech and behavior are normal.  ____________________________________________   LABS (all labs ordered are listed, but only abnormal results are displayed)  Labs Reviewed - No data to display ____________________________________________  EKG  ED ECG REPORT I, [place], the attending physician, personally viewed and interpreted this ECG.   Date: 08/23/2016  EKG Time: 12:13 AM  Rate: 60  Rhythm: Normal sinus rhythm  Axis: Normal  Intervals: Normal  ST&T Change: None  ____________________________________________  RADIOLOGY I, [place] N BROWN, personally viewed and evaluated these images (plain radiographs) as part of my medical decision making, as well as reviewing the written report by the radiologist.  Dg Shoulder Left  Result Date: 08/22/2016 CLINICAL DATA:  Shoulder pain after taking down Christmas lights. EXAM: LEFT SHOULDER - 2+ VIEW COMPARISON:  LEFT shoulder radiograph February 19, 2011 FINDINGS: Bony fragments project in the inferior glenohumeral joint space, no donor site. No dislocation. LEFT shoulder arthroplasty. Intact well-seated non cemented hardware without periprosthetic lucency. Cardiac pacemaker LEFT chest. Soft tissue planes are nonsuspicious. IMPRESSION: Bony fragments LEFT inferior  glenohumeral joint space concerning for acute fracture, possible heterotopic ossification. Electronically Signed   By: Elon Alas M.D.   On: 08/22/2016 22:21      Procedures     INITIAL IMPRESSION / ASSESSMENT AND PLAN / ED COURSE  Pertinent labs & imaging results that were available during my care of the patient were reviewed by me and considered in my medical decision making (see chart for details).  83 year old female presented with nontraumatic left shoulder pain that is worse with movement Given history physical exam and comparison with a chest x-ray that was performed earlier in the year suspect fracture to be unlikely more likely to be heterotropic ossification of the left shoulder. Patient's pain improved with Lidoderm patch application and Toradol current pain score is 2 out of 10. Patient continues to have no chest pain no shortness of breath.   Clinical Course     ____________________________________________  FINAL CLINICAL IMPRESSION(S) / ED DIAGNOSES  Final diagnoses:  Strain of left shoulder, initial encounter     MEDICATIONS GIVEN DURING THIS VISIT:  Medications  ketorolac (TORADOL) 30 MG/ML injection 30 mg (not administered)  lidocaine (LIDODERM) 5 % 2 patch (not administered)     NEW OUTPATIENT MEDICATIONS STARTED DURING THIS VISIT:  New Prescriptions   No medications on file    Modified Medications   No medications on file    Discontinued Medications   No medications on file     Note:  This document was prepared using Dragon voice recognition software and may include unintentional dictation errors.    Gregor Hams, MD 08/23/16 [phone]

## 2016-08-25 NOTE — Addendum Note (Signed)
Addended by: Milinda Pointer A on: 08/25/2016 11:58 AM   Modules accepted: Orders

## 2016-08-26 ENCOUNTER — Other Ambulatory Visit: Payer: Self-pay | Admitting: Family Medicine

## 2016-08-26 DIAGNOSIS — E876 Hypokalemia: Secondary | ICD-10-CM

## 2016-08-31 DIAGNOSIS — E876 Hypokalemia: Secondary | ICD-10-CM | POA: Diagnosis not present

## 2016-09-01 LAB — BASIC METABOLIC PANEL WITH GFR
BUN: 28 mg/dL — ABNORMAL HIGH (ref 7–25)
CALCIUM: 9.4 mg/dL (ref 8.6–10.4)
CHLORIDE: 107 mmol/L (ref 98–110)
CO2: 25 mmol/L (ref 20–31)
CREATININE: 1.26 mg/dL — AB (ref 0.60–0.88)
GFR, EST AFRICAN AMERICAN: 45 mL/min — AB (ref >=60)
GFR, Est Non African American: 39 mL/min — ABNORMAL LOW (ref >=60)
Glucose, Bld: 72 mg/dL (ref 65–99)
Potassium: 4.4 mmol/L (ref 3.5–5.3)
SODIUM: 140 mmol/L (ref 135–146)

## 2016-09-09 ENCOUNTER — Other Ambulatory Visit: Payer: Self-pay | Admitting: Family Medicine

## 2016-09-09 ENCOUNTER — Ambulatory Visit: Payer: Medicare Other | Admitting: Pain Medicine

## 2016-09-09 DIAGNOSIS — I10 Essential (primary) hypertension: Secondary | ICD-10-CM

## 2016-09-10 DIAGNOSIS — J45998 Other asthma: Secondary | ICD-10-CM | POA: Diagnosis not present

## 2016-09-13 NOTE — Progress Notes (Signed)
Patient's Name: Kristin [name]  MRN: [id number]  Referring Provider: Hawkins, James H Jr., MD  DOB: 08/18/1930  PCP: James H Hawkins Jr., MD  DOS: 09/14/2016  Note by:  A. , MD  Service setting: Ambulatory outpatient  Specialty: Interventional Pain Management  Location: [facility] (AMB) Pain Management Facility    Patient type: Established   Primary Reason(s) for Visit: Encounter for prescription drug management & post-procedure evaluation of chronic illness with mild to moderate exacerbation(Level of risk: moderate) CC: Back Pain (lower, more on the left than right side)  HPI  Kristin [name] is a 83 y.o. year old, female patient, who comes today for a post-procedure evaluation and medication management. She has Abnormal glucose tolerance test; Chronic diastolic heart failure (HCC); Chronic kidney disease (CKD), stage III (moderate); CAFL (chronic airflow limitation) (HCC); Complete rotator cuff rupture of shoulder (Left); CCF (congestive cardiac failure) (HCC); DDD (degenerative disc disease), lumbosacral; Gout; Essential (primary) hypertension; Benign essential HTN; BP (high blood pressure); H/O cardiac catheterization; Arrhythmia, sinus node (HCC); Upper respiratory infection, viral; Lumbar canal stenosis; Urinary frequency; Chronic obstructive pulmonary disease (HCC); Congestive heart failure (HCC); Degeneration of intervertebral disc of lumbosacral region; Chemical diabetes; History of cardiac catheterization; Sinoatrial node dysfunction (HCC); Need for influenza vaccination; Frequency of urination; Seasonal allergies; Breast cancer, right breast (HCC); Chronic pain syndrome; Chronic low back pain (Location of Primary Source of Pain) (Bilateral) (L>R); Lumbar facet syndrome (Bilateral) (L>R); Lumbar spondylosis; Chronic lumbar radicular pain (Bilateral) (L>R) (L5 Dermatome); Restless leg syndrome; Myofascial pain; Neuropathic pain; Neurogenic pain; Long term current use of opiate analgesic; Long  term prescription opiate use; Opiate use (90 MME/Day); Encounter for therapeutic drug level monitoring; Chronic constipation; Opioid-induced constipation (OIC); Vitamin D insufficiency; Grade 1 Retrolisthesis of L1 over L2; Lumbar facet hypertrophy (L1-2, L2-3, and L4-5); Stenosis of lateral recess of lumbar spine (L1-2 and L4-5); 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); Epidural fibrosis; Lumbar postlaminectomy syndrome; Chronic hip pain (bilateral) (L>R); 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); 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); Hypomagnesemia; Osteoporosis; Chronic sacroiliac joint pain (Left); MRSA (methicillin resistant staph aureus) culture positive; Mitral valve prolapse; Near syncope; New onset atrial flutter (HCC); UTI (lower urinary tract infection); Rotator cuff syndrome; Disorder of rotator cuff; GERD (gastroesophageal reflux disease); Sick sinus syndrome (HCC); History of cardiac pacemaker in situ; Chronic shoulder pain (S/P replacement) (Right); Osteoarthritis, multiple sites; Osteoarthritis of shoulder (Right); Shoulder pain S/P prostheses (Right); Chronic diastolic CHF (congestive heart failure) (HCC); Chronic kidney disease, stage III (moderate); S/P cardiac pacemaker procedure; and Tear of rotator cuff on her problem list. Her primarily concern today is the Back Pain (lower, more on the left than right side)  Pain Assessment: Self-Reported Pain Score: 4 /10 Clinically the patient looks like a 1/10 Reported level is inconsistent with clinical observations. Information on the proper use of the pain score provided to the patient today Pain Type: Chronic pain Pain Location: Back Pain Orientation: Lower, Left, Right (more on the left side) Pain Descriptors / Indicators: Aching, Constant (worse on movement) Pain Frequency: Constant  Kristin [name] was last seen on 07/28/2016 for a procedure. During today's appointment we reviewed Ms. Dandridge's  post-procedure results, as well as her outpatient medication regimen. The patient indicates excellent results to the right-sided diagnostic suprascapular nerve block. She indicates currently having absolutely no pain in that shoulder. She also indicates that she has no range of motion but this seems to be mechanical as she is not hurt when she tries to   move the arm. At this point, we'll go back to her lower back which is still given her some problems. We have done to diagnostic lumbar facet blocks with excellent results and at this point I believe that she would be a good candidate for radiofrequency. She is no longer a candidate for any further physical therapy due to her advanced age and other medical problems. Because the patient indicates that the left lower back gives her more problems, we will go ahead and schedule her to do a R facet radiofrequency ablation under fluoroscopic guidance and IV sedation, starting with that left side.  Further details on both, my assessment(s), as well as the proposed treatment plan, please see below.  Controlled Substance Pharmacotherapy Assessment REMS (Risk Evaluation and Mitigation Strategy)  Analgesic:Oxycodone IR 20 mg every 8 hours (60 mg/day) MME/day:90 mg/day Patti Powell, RN  09/14/2016  2:35 PM  Sign at close encounter Nursing Pain Medication Assessment:  Safety precautions to be maintained throughout the outpatient stay will include: orient to surroundings, keep bed in low position, maintain call bell within reach at all times, provide assistance with transfer out of bed and ambulation.  Medication Inspection Compliance: Pill count conducted under aseptic conditions, in front of the patient. Neither the pills nor the bottle was removed from the patient's sight at any time. Once count was completed pills were immediately returned to the patient in their original bottle.  Medication: Oxycodone IR Pill Count: 4 of 90 pills remain Bottle Appearance:  Standard pharmacy container. Clearly labeled. Filled Date: 12 /23 / 2017 Medication last intake: 09/14/2016   Pharmacokinetics: Liberation and absorption (onset of action): WNL Distribution (time to peak effect): WNL Metabolism and excretion (duration of action): WNL         Pharmacodynamics: Desired effects: Analgesia: Ms. Weinel reports >50% benefit. Functional ability: Patient reports that medication allows her to accomplish basic ADLs Clinically meaningful improvement in function (CMIF): Sustained CMIF goals met Perceived effectiveness: Described as relatively effective, allowing for increase in activities of daily living (ADL) Undesirable effects: Side-effects or Adverse reactions: None reported Monitoring: [place] PMP: Online review of the past 12-month period conducted. Compliant with practice rules and regulations List of all UDS test(s) done:  Lab Results  Component Value Date   TOXASSSELUR FINAL 12/18/2015   TOXASSSELUR FINAL 09/24/2015   TOXASSSELUR FINAL 06/25/2015   Last UDS on record: ToxAssure Select 13  Date Value Ref Range Status  12/18/2015 FINAL  Final    Comment:    ==================================================================== TOXASSURE SELECT 13 (MW) ==================================================================== Test                             Result       Flag       Units Drug Present and Declared for Prescription Verification   Oxycodone                      2377         EXPECTED   ng/mg creat   Oxymorphone                    2225         EXPECTED   ng/mg creat   Noroxycodone                   7596         EXPECTED   ng/mg creat   Noroxymorphone                   911          EXPECTED   ng/mg creat    Sources of oxycodone are scheduled prescription medications.    Oxymorphone, noroxycodone, and noroxymorphone are expected    metabolites of oxycodone. Oxymorphone is also available as a    scheduled prescription  medication. ==================================================================== Test                      Result    Flag   Units      Ref Range   Creatinine              103              mg/dL      >=20 ==================================================================== Declared Medications:  The flagging and interpretation on this report are based on the  following declared medications.  Unexpected results may arise from  inaccuracies in the declared medications.  **Note: The testing scope of this panel includes these medications:  Oxycodone  **Note: The testing scope of this panel does not include following  reported medications:  Albuterol  Apixaban  Aspirin  Bisacodyl  Calcium citrate (Calcium citrate/Vitamin D)  Cholecalciferol  Furosemide  Levocetirizine  Lisinopril  Magnesium Oxide  Omeprazole  Oxygen  Ropinirole  Supplement  Vitamin D (Calcium citrate/Vitamin D) ==================================================================== For clinical consultation, please call [phone]. ====================================================================    UDS interpretation: Compliant          Medication Assessment Form: Reviewed. Patient indicates being compliant with therapy Treatment compliance: Compliant Risk Assessment Profile: Aberrant behavior: See prior evaluations. None observed or detected today Comorbid factors increasing risk of overdose: See prior notes. No additional risks detected today Risk of substance use disorder (SUD): Low Opioid Risk Tool (ORT) Total Score: 0  Interpretation Table:  Score <3 = Low Risk for SUD  Score between 4-7 = Moderate Risk for SUD  Score >8 = High Risk for Opioid Abuse   Risk Mitigation Strategies:  Patient Counseling: Covered Patient-Prescriber Agreement (PPA): Present and active  Notification to other healthcare providers: Done  Pharmacologic Plan: No change in therapy, at this time  Post-Procedure Assessment   07/28/2016 Procedure: Diagnostic Right Suprascapular NB Post-procedure pain score: 0/10         Influential Factors: BMI: 24.37 kg/m Intra-procedural challenges: None observed Assessment challenges: None detected         Post-procedural side-effects, adverse reactions, or complications: None reported Reported issues: None  Sedation: No sedation used. When no sedatives are used, the analgesic levels obtained are directly associated to the effectiveness of the local anesthetics. However, when sedation is provided, the level of analgesia obtained during the initial 1 hour following the intervention, is believed to be the result of a combination of factors. These factors may include, but are not limited to: 1. The effectiveness of the local anesthetics used. 2. The effects of the analgesic(s) and/or anxiolytic(s) used. 3. The degree of discomfort experienced by the patient at the time of the procedure. 4. The patients ability and reliability in recalling and recording the events. 5. The presence and influence of possible secondary gains and/or psychosocial factors. Reported result: Relief experienced during the 1st hour after the procedure: 60 % (Ultra-Short Term Relief) Interpretative annotation: Analgesia during this period is likely to be Local Anesthetic and/or IV Sedative (Analgesic/Anxiolitic) related.          Effects of local anesthetic: The analgesic effects attained during this period are directly associated to the localized infiltration of local   anesthetics and therefore cary significant diagnostic value as to the etiological location, or anatomical origin, of the pain. Expected duration of relief is directly dependent on the pharmacodynamics of the local anesthetic used. Long-acting (4-6 hours) anesthetics used.  Reported result: Relief during the next 4 to 6 hour after the procedure: 70 % (Short-Term Relief) Interpretative annotation: Complete relief would suggest area to be the source  of the pain.          Long-term benefit: Defined as the period of time past the expected duration of local anesthetics. With the possible exception of prolonged sympathetic blockade from the local anesthetics, benefits during this period are typically attributed to, or associated with, other factors such as analgesic sensory neuropraxia, antiinflammatory effects, or beneficial biochemical changes provided by agents other than the local anesthetics Reported result: Extended relief following procedure: 90 % (good pain relief but doesn't have ROM ) (Long-Term Relief) Interpretative annotation: Good relief. This could suggest inflammation to be a significant component in the etiology to the pain.          Current benefits: Defined as persistent relief that continues at this point in time.   Reported results: Treated area: 75 %       Interpretative annotation: Ongoing benefits would suggest effective therapeutic approach  Interpretation: Results would suggest a successful diagnostic intervention. If the pain returns, she may be a good candidate for radiofrequency ablation.  Laboratory Chemistry  Inflammation Markers Lab Results  Component Value Date   ESRSEDRATE 12 09/24/2015   CRP <0.5 09/24/2015   Renal Function Lab Results  Component Value Date   BUN 28 (H) 08/31/2016   CREATININE 1.26 (H) 08/31/2016   GFRAA 45 (L) 08/31/2016   GFRNONAA 39 (L) 08/31/2016   Hepatic Function Lab Results  Component Value Date   AST 21 03/02/2016   ALT 10 03/02/2016   ALBUMIN 3.7 03/02/2016   Electrolytes Lab Results  Component Value Date   NA 140 08/31/2016   K 4.4 08/31/2016   CL 107 08/31/2016   CALCIUM 9.4 08/31/2016   MG 2.2 09/24/2015   Pain Modulating Vitamins No results found for: Maralyn Sago [id number], [id number], [id number], 25OHVITD1, 25OHVITD2, 25OHVITD3, VITAMINB12 Coagulation Parameters Lab Results  Component Value Date   INR 1.24 12/16/2015   LABPROT 15.8 (H) 12/16/2015   APTT  26 12/16/2015   PLT 191 03/02/2016   Cardiovascular Lab Results  Component Value Date   HGB 13.5 03/02/2016   HCT 41.6 03/02/2016   Note: Lab results reviewed.  Recent Diagnostic Imaging Review  Dg Shoulder Left  Result Date: 08/22/2016 CLINICAL DATA:  Shoulder pain after taking down Christmas lights. EXAM: LEFT SHOULDER - 2+ VIEW COMPARISON:  LEFT shoulder radiograph February 19, 2011 FINDINGS: Bony fragments project in the inferior glenohumeral joint space, no donor site. No dislocation. LEFT shoulder arthroplasty. Intact well-seated non cemented hardware without periprosthetic lucency. Cardiac pacemaker LEFT chest. Soft tissue planes are nonsuspicious. IMPRESSION: Bony fragments LEFT inferior glenohumeral joint space concerning for acute fracture, possible heterotopic ossification. Electronically Signed   By: Elon Alas M.D.   On: 08/22/2016 22:21   Note: Imaging results reviewed.          Meds  The patient has a current medication list which includes the following prescription(s): albuterol, apixaban, aspirin ec, bisacodyl, calcium citrate-vitamin d, vitamin d, furosemide, levocetirizine, lisinopril, magnesium oxide, omeprazole, oxygen-helium, benefiber drink mix, oxycodone hcl, oxycodone hcl, oxycodone hcl, and ropinirole.  Current Outpatient Prescriptions on File Prior to Visit  Medication Sig  .  albuterol (PROVENTIL) (2.5 MG/3ML) 0.083% nebulizer solution Take 3 mLs (2.5 mg total) by nebulization every 8 (eight) hours as needed. Shortness of breath and wheezing  . apixaban (ELIQUIS) 5 MG TABS tablet Take 1 tablet (5 mg total) by mouth 2 (two) times daily.  . aspirin EC 325 MG tablet Take 325 mg by mouth daily.   . bisacodyl (DULCOLAX) 5 MG EC tablet Take 2 tablets (10 mg total) by mouth daily as needed for moderate constipation.  . calcium citrate-vitamin D (CITRACAL+D) 315-200 MG-UNIT per tablet Take 1 tablet by mouth 2 (two) times daily.  . furosemide (LASIX) 20 MG tablet  Take 1 tablet (20 mg total) by mouth daily.  . levocetirizine (XYZAL) 5 MG tablet Take 1 tablet (5 mg total) by mouth every evening.  . lisinopril (PRINIVIL,ZESTRIL) 10 MG tablet TAKE 1 TABLET BY MOUTH ONCE DAILY.  . magnesium oxide (MAG-OX) 400 MG tablet Take 1 tablet (400 mg total) by mouth daily.  . omeprazole (PRILOSEC) 20 MG capsule Take 1 capsule (20 mg total) by mouth daily.  . OXYGEN Inhale 2 Doses into the lungs at bedtime.  . Wheat Dextrin (BENEFIBER DRINK MIX) PACK Stir 2 teaspoons of Benefiber into 4-8 oz of any non-carbonated beverage or soft food (hot or cold) PO TID as needed   No current facility-administered medications on file prior to visit.    ROS  Constitutional: Denies any fever or chills Gastrointestinal: No reported hemesis, hematochezia, vomiting, or acute GI distress Musculoskeletal: Denies any acute onset joint swelling, redness, loss of ROM, or weakness Neurological: No reported episodes of acute onset apraxia, aphasia, dysarthria, agnosia, amnesia, paralysis, loss of coordination, or loss of consciousness  Allergies  Ms. Revelo is allergic to cyclobenzaprine; flexeril [cyclobenzaprine hcl]; and vancomycin.  PFSH  Drug: Ms. Wexler  reports that she does not use drugs. Alcohol:  reports that she does not drink alcohol. Tobacco:  reports that she quit smoking about 17 years ago. Her smoking use included Cigarettes. She has a 50.00 pack-year smoking history. She has never used smokeless tobacco. Medical:  has a past medical history of Arthritis; Arthritis; Atrial fibrillation (HCC); Back pain; Bradycardia; Breast cancer (HCC) (2004); Cancer (HCC) (2004); CHF (congestive heart failure) (HCC); Chronic back pain; CKD (chronic kidney disease); COPD (chronic obstructive pulmonary disease) (HCC); Cystocele; Decubitus ulcers; Gout; Hematuria; Hepatitis C; Hyperlipidemia; Hypertension; Hypomagnesemia (06/25/2015); OAB (overactive bladder); Overactive bladder; Restless leg;  Shoulder pain, bilateral; Urinary frequency; UTI (lower urinary tract infection); and Vaginal atrophy. Family: family history includes Breast cancer in her paternal aunt; Breast cancer (age of onset: 50) in her sister; Cancer in her sister; Heart disease in her father and mother; Kidney disease in her brother; Stroke in her mother.  Past Surgical History:  Procedure Laterality Date  . ABDOMINAL HYSTERECTOMY    . BACK SURGERY    . BREAST SURGERY     rt lumpectomy  . CARPAL TUNNEL RELEASE    . CATARACT EXTRACTION W/PHACO  10/19/2011   Procedure: CATARACT EXTRACTION PHACO AND INTRAOCULAR LENS PLACEMENT (IOC);  Surgeon: Mark T. Shapiro, MD;  Location: [facility];  Service: Ophthalmology;  Laterality: Right;  CDE:10.81  . CATARACT EXTRACTION W/PHACO  11/02/2011   Procedure: CATARACT EXTRACTION PHACO AND INTRAOCULAR LENS PLACEMENT (IOC);  Surgeon: Mark T. Shapiro, MD;  Location: [facility];  Service: Ophthalmology;  Laterality: Left;  CDE 8.60  . COLON SURGERY    . JOINT REPLACEMENT     bilateral TKA  . PACEMAKER INSERTION N/A 12/24/2015     Procedure: INSERTION PACEMAKER;  Surgeon: Alexander Paraschos, MD;  Location: [facility];  Service: Cardiovascular;  Laterality: N/A;  . ROTATOR CUFF REPAIR     bilateral   Constitutional Exam  General appearance: Well nourished, well developed, and well hydrated. In no apparent acute distress Vitals:   09/14/16 1420  BP: 118/79  Pulse: 88  Resp: 16  Temp: 98.6 F (37 C)  TempSrc: Oral  SpO2: 96%  Weight: 165 lb (74.8 kg)  Height: 5' 9" (1.753 m)   BMI Assessment: Estimated body mass index is 24.37 kg/m as calculated from the following:   Height as of this encounter: 5' 9" (1.753 m).   Weight as of this encounter: 165 lb (74.8 kg).  BMI interpretation table: BMI level Category Range association with higher incidence of chronic pain  <18 kg/m2 Underweight   18.5-24.9 kg/m2 Ideal body weight   25-29.9 kg/m2 Overweight Increased incidence by 20%  30-34.9  kg/m2 Obese (Class I) Increased incidence by 68%  35-39.9 kg/m2 Severe obesity (Class II) Increased incidence by 136%  >40 kg/m2 Extreme obesity (Class III) Increased incidence by 254%   BMI Readings from Last 4 Encounters:  09/14/16 24.37 kg/m  07/28/16 24.81 kg/m  07/21/16 25.10 kg/m  07/07/16 25.10 kg/m   Wt Readings from Last 4 Encounters:  09/14/16 165 lb (74.8 kg)  07/28/16 168 lb (76.2 kg)  07/21/16 170 lb (77.1 kg)  07/07/16 170 lb (77.1 kg)  Psych/Mental status: Alert, oriented x 3 (person, place, & time)       Eyes: PERLA Respiratory: No evidence of acute respiratory distress  Cervical Spine Exam  Inspection: No masses, redness, or swelling Alignment: Symmetrical Functional ROM: Unrestricted ROM Stability: No instability detected Muscle strength & Tone: Functionally intact Sensory: Unimpaired Palpation: Non-contributory  Upper Extremity (UE) Exam    Side: Right upper extremity  Side: Left upper extremity  Inspection: No masses, redness, swelling, or asymmetry  Inspection: No masses, redness, swelling, or asymmetry  Functional ROM: Unrestricted ROM          Functional ROM: Unrestricted ROM          Muscle strength & Tone: Functionally intact  Muscle strength & Tone: Functionally intact  Sensory: Unimpaired  Sensory: Unimpaired  Palpation: Non-contributory  Palpation: Non-contributory   Thoracic Spine Exam  Inspection: No masses, redness, or swelling Alignment: Symmetrical Functional ROM: Unrestricted ROM Stability: No instability detected Sensory: Unimpaired Muscle strength & Tone: Functionally intact Palpation: Non-contributory  Lumbar Spine Exam  Inspection: No masses, redness, or swelling Alignment: Symmetrical Functional ROM: Unrestricted ROM Stability: No instability detected Muscle strength & Tone: Functionally intact Sensory: Unimpaired Palpation: Non-contributory Provocative Tests: Lumbar Hyperextension and rotation test: evaluation deferred  today       Patrick's Maneuver: evaluation deferred today              Gait & Posture Assessment  Ambulation: Unassisted Gait: Relatively normal for age and body habitus Posture: WNL   Lower Extremity Exam    Side: Right lower extremity  Side: Left lower extremity  Inspection: No masses, redness, swelling, or asymmetry  Inspection: No masses, redness, swelling, or asymmetry  Functional ROM: Unrestricted ROM          Functional ROM: Unrestricted ROM          Muscle strength & Tone: Functionally intact  Muscle strength & Tone: Functionally intact  Sensory: Unimpaired  Sensory: Unimpaired  Palpation: Non-contributory  Palpation: Non-contributory   Assessment  Primary Diagnosis & Pertinent Problem List: The   primary encounter diagnosis was Chronic pain syndrome. 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  Status Diagnosis  Controlled Controlled Controlled 1. Chronic pain syndrome   2. Chronic low back pain (Location of Primary Source of Pain) (Bilateral) (L>R)   3. Chronic lumbar radicular pain (Bilateral) (L>R) (L5 Dermatome)   4. Lumbar postlaminectomy syndrome   5. Lumbar facet syndrome (Bilateral) (L>R)   6. Stenosis of lateral recess of lumbar spine (L1-2 and L4-5)   7. Long term current use of opiate analgesic   8. Opiate use (90 MME/Day)   9. Restless leg syndrome      Plan of Care  Pharmacotherapy (Medications Ordered): Meds ordered this encounter  Medications  . Oxycodone HCl 20 MG TABS    Sig: Take 1 tablet (20 mg total) by mouth every 8 (eight) hours as needed.    Dispense:  90 tablet    Refill:  0     Do not add this medication to the electronic "Automatic Refill" notification system. Patient may have prescription filled one day early if pharmacy is closed on scheduled refill date. Do not fill until: 09/14/16 To last until: 10/14/16  . Oxycodone HCl 20 MG TABS    Sig: Take 1 tablet (20 mg total) by mouth every 8 (eight) hours as needed.    Dispense:  90 tablet    Refill:  0    Do not add this medication to the electronic "Automatic Refill" notification system. Patient may have prescription filled one day early if pharmacy is closed on scheduled refill date. Do not fill until: 10/14/16 To last until: 11/13/16  . Oxycodone HCl 20 MG TABS    Sig: Take 1 tablet (20 mg total) by mouth every 8 (eight) hours as needed.    Dispense:  90 tablet    Refill:  0    Do not add this medication to the electronic "Automatic Refill" notification system. Patient may have prescription filled one day early if pharmacy is closed on scheduled refill date. Do not fill until: 11/13/16 To last until: 12/13/16  . rOPINIRole (REQUIP) 2 MG tablet    Sig: Take 1 tablet (2 mg total) by mouth at bedtime.    Dispense:  90 tablet    Refill:  0    Do not place this medication, or any other prescription from our practice, on "Automatic Refill". Patient may have prescription filled one day early if pharmacy is closed on scheduled refill date.   New Prescriptions   No medications on file   Medications administered today: Ms. Klingel had no medications administered during this visit. Lab-work, procedure(s), and/or referral(s): Orders Placed This Encounter  Procedures  . Radiofrequency,Lumbar   Imaging and/or referral(s): None  Interventional therapies: Planned, scheduled, and/or pending:   Left Lumbar Facet RFA.   Considering:   Possible bilateral lumbar facet radiofrequency. Palliative bilateral lumbar facet block under fluoroscopic guidance and IV sedation Diagnostic right intra-articular shoulder joint  injection under fluoroscopic guidance, no sedation.  Diagnostic right suprascapular nerve block under fluoroscopic guidance, with or without sedation.  Possible right suprascapular radiofrequency ablation.    Palliative PRN treatment(s):   Palliative bilateral lumbar facet block under fluoroscopic guidance  and IV sedation Diagnostic right intra-articular shoulder joint injection under fluoroscopic guidance, no sedation.  Diagnostic right suprascapular nerve block under fluoroscopic guidance, with or without sedation.  Possible right suprascapular radiofrequency ablation.    Provider-requested follow-up: Return in 2 months (on 11/23/2016) for (MD) Med-Mgmt, in addition, procedure (ASAA).  Future Appointments Date Time Provider Department Center  11/01/2016 11:00 AM James H Hawkins Jr., MD [facility] None  11/11/2016 1:00 PM  , MD [facility] None   Primary Care Physician: James H Hawkins Jr., MD Location: [facility] Note by:  A. , M.D, DABA, DABAPM, DABPM, DABIPP, FIPP Date: 09/14/2016; Time: 3:18 PM  Pain Score Disclaimer: We use the NRS-11 scale. This is a self-reported, subjective measurement of pain severity with only modest accuracy. It is used primarily to identify changes within a particular patient. It must be understood that outpatient pain scales are significantly less accurate that those used for research, where they can be applied under ideal controlled circumstances with minimal exposure to variables. In reality, the score is likely to be a combination of pain intensity and pain affect, where pain affect describes the degree of emotional arousal or changes in action readiness caused by the sensory experience of pain. Factors such as social and work situation, setting, emotional state, anxiety levels, expectation, and prior pain experience may influence pain perception and show large inter-individual differences that may also be  affected by time variables.  Patient instructions provided during this appointment: Patient Instructions   Make sure you stop your ASA and Eliquis 3 days before procedure, when we schedule it. We will have to get approved with your insurance and will call you for appointment  GENERAL RISKS AND COMPLICATIONS  What are the risk, side effects and possible complications? Generally speaking, most procedures are safe.  However, with any procedure there are risks, side effects, and the possibility of complications.  The risks and complications are dependent upon the sites that are lesioned, or the type of nerve block to be performed.  The closer the procedure is to the spine, the more serious the risks are.  Great care is taken when placing the radio frequency needles, block needles or lesioning probes, but sometimes complications can occur. 1. Infection: Any time there is an injection through the skin, there is a risk of infection.  This is why sterile conditions are used for these blocks.  There are four possible types of infection. 1. Localized skin infection. 2. Central Nervous System Infection-This can be in the form of Meningitis, which can be deadly. 3. Epidural Infections-This can be in the form of an epidural abscess, which can cause pressure inside of the spine, causing compression of the spinal cord with subsequent paralysis. This would require an emergency surgery to decompress, and there are no guarantees that the patient would recover from the paralysis. 4. Discitis-This is an infection of the intervertebral discs.  It occurs in about 1% of discography procedures.  It is difficult to treat and it may lead to surgery.        2. Pain: the needles have to go through skin and soft tissues, will cause soreness.       3. Damage to internal structures:  The nerves to be lesioned may be near blood vessels or    other nerves which can be potentially damaged.       4. Bleeding: Bleeding is more  common if the patient is taking blood thinners such as  aspirin, Coumadin, Ticiid,   Plavix, etc., or if he/she have some genetic predisposition  such as hemophilia. Bleeding into the spinal canal can cause compression of the spinal  cord with subsequent paralysis.  This would require an emergency surgery to  decompress and there are no guarantees that the patient would recover from the  paralysis.       5. Pneumothorax:  Puncturing of a lung is a possibility, every time a needle is introduced in  the area of the chest or upper back.  Pneumothorax refers to free air around the  collapsed lung(s), inside of the thoracic cavity (chest cavity).  Another two possible  complications related to a similar event would include: Hemothorax and Chylothorax.   These are variations of the Pneumothorax, where instead of air around the collapsed  lung(s), you may have blood or chyle, respectively.       6. Spinal headaches: They may occur with any procedures in the area of the spine.       7. Persistent CSF (Cerebro-Spinal Fluid) leakage: This is a rare problem, but may occur  with prolonged intrathecal or epidural catheters either due to the formation of a fistulous  track or a dural tear.       8. Nerve damage: By working so close to the spinal cord, there is always a possibility of  nerve damage, which could be as serious as a permanent spinal cord injury with  paralysis.       9. Death:  Although rare, severe deadly allergic reactions known as "Anaphylactic  reaction" can occur to any of the medications used.      10. Worsening of the symptoms:  We can always make thing worse.  What are the chances of something like this happening? Chances of any of this occuring are extremely low.  By statistics, you have more of a chance of getting killed in a motor vehicle accident: while driving to the hospital than any of the above occurring .  Nevertheless, you should be aware that they are possibilities.  In general, it is  similar to taking a shower.  Everybody knows that you can slip, hit your head and get killed.  Does that mean that you should not shower again?  Nevertheless always keep in mind that statistics do not mean anything if you happen to be on the wrong side of them.  Even if a procedure has a 1 (one) in a 1,000,000 (million) chance of going wrong, it you happen to be that one..Also, keep in mind that by statistics, you have more of a chance of having something go wrong when taking medications.  Who should not have this procedure? If you are on a blood thinning medication (e.g. Coumadin, Plavix, see list of "Blood Thinners"), or if you have an active infection going on, you should not have the procedure.  If you are taking any blood thinners, please inform your physician.  How should I prepare for this procedure?  Do not eat or drink anything at least six hours prior to the procedure.  Bring a driver with you .  It cannot be a taxi.  Come accompanied by an adult that can drive you back, and that is strong enough to help you if your legs get weak or numb from the local anesthetic.  Take all of your medicines the morning of the procedure with just enough water to swallow them.  If you have diabetes, make sure that you are scheduled to have your procedure done first   thing in the morning, whenever possible.  If you have diabetes, take only half of your insulin dose and notify our nurse that you have done so as soon as you arrive at the clinic.  If you are diabetic, but only take blood sugar pills (oral hypoglycemic), then do not take them on the morning of your procedure.  You may take them after you have had the procedure.  Do not take aspirin or any aspirin-containing medications, at least eleven (11) days prior to the procedure.  They may prolong bleeding.  Wear loose fitting clothing that may be easy to take off and that you would not mind if it got stained with Betadine or blood.  Do not wear any  jewelry or perfume  Remove any nail coloring.  It will interfere with some of our monitoring equipment.  NOTE: Remember that this is not meant to be interpreted as a complete list of all possible complications.  Unforeseen problems may occur.  BLOOD THINNERS The following drugs contain aspirin or other products, which can cause increased bleeding during surgery and should not be taken for 2 weeks prior to and 1 week after surgery.  If you should need take something for relief of minor pain, you may take acetaminophen which is found in Tylenol,m Datril, Anacin-3 and Panadol. It is not blood thinner. The products listed below are.  Do not take any of the products listed below in addition to any listed on your instruction sheet.  A.P.C or A.P.C with Codeine Codeine Phosphate Capsules #3 Ibuprofen Ridaura  ABC compound Congesprin Imuran rimadil  Advil Cope Indocin Robaxisal  Alka-Seltzer Effervescent Pain Reliever and Antacid Coricidin or Coricidin-D  Indomethacin Rufen  Alka-Seltzer plus Cold Medicine Cosprin Ketoprofen S-A-C Tablets  Anacin Analgesic Tablets or Capsules Coumadin Korlgesic Salflex  Anacin Extra Strength Analgesic tablets or capsules CP-2 Tablets Lanoril Salicylate  Anaprox Cuprimine Capsules Levenox Salocol  Anexsia-D Dalteparin Magan Salsalate  Anodynos Darvon compound Magnesium Salicylate Sine-off  Ansaid Dasin Capsules Magsal Sodium Salicylate  Anturane Depen Capsules Marnal Soma  APF Arthritis pain formula Dewitt's Pills Measurin Stanback  Argesic Dia-Gesic Meclofenamic Sulfinpyrazone  Arthritis Bayer Timed Release Aspirin Diclofenac Meclomen Sulindac  Arthritis pain formula Anacin Dicumarol Medipren Supac  Analgesic (Safety coated) Arthralgen Diffunasal Mefanamic Suprofen  Arthritis Strength Bufferin Dihydrocodeine Mepro Compound Suprol  Arthropan liquid Dopirydamole Methcarbomol with Aspirin Synalgos  ASA tablets/Enseals Disalcid Micrainin Tagament  Ascriptin Doan's  Midol Talwin  Ascriptin A/D Dolene Mobidin Tanderil  Ascriptin Extra Strength Dolobid Moblgesic Ticlid  Ascriptin with Codeine Doloprin or Doloprin with Codeine Momentum Tolectin  Asperbuf Duoprin Mono-gesic Trendar  Aspergum Duradyne Motrin or Motrin IB Triminicin  Aspirin plain, buffered or enteric coated Durasal Myochrisine Trigesic  Aspirin Suppositories Easprin Nalfon Trillsate  Aspirin with Codeine Ecotrin Regular or Extra Strength Naprosyn Uracel  Atromid-S Efficin Naproxen Ursinus  Auranofin Capsules Elmiron Neocylate Vanquish  Axotal Emagrin Norgesic Verin  Azathioprine Empirin or Empirin with Codeine Normiflo Vitamin E  Azolid Emprazil Nuprin Voltaren  Bayer Aspirin plain, buffered or children's or timed BC Tablets or powders Encaprin Orgaran Warfarin Sodium  Buff-a-Comp Enoxaparin Orudis Zorpin  Buff-a-Comp with Codeine Equegesic Os-Cal-Gesic   Buffaprin Excedrin plain, buffered or Extra Strength Oxalid   Bufferin Arthritis Strength Feldene Oxphenbutazone   Bufferin plain or Extra Strength Feldene Capsules Oxycodone with Aspirin   Bufferin with Codeine Fenoprofen Fenoprofen Pabalate or Pabalate-SF   Buffets II Flogesic Panagesic   Buffinol plain or Extra Strength Florinal or Florinal with Codeine Panwarfarin  Buf-Tabs Flurbiprofen Penicillamine   Butalbital Compound Four-way cold tablets Penicillin   Butazolidin Fragmin Pepto-Bismol   Carbenicillin Geminisyn Percodan   Carna Arthritis Reliever Geopen Persantine   Carprofen Gold's salt Persistin   Chloramphenicol Goody's Phenylbutazone   Chloromycetin Haltrain Piroxlcam   Clmetidine heparin Plaquenil   Cllnoril Hyco-pap Ponstel   Clofibrate Hydroxy chloroquine Propoxyphen         Before stopping any of these medications, be sure to consult the physician who ordered them.  Some, such as Coumadin (Warfarin) are ordered to prevent or treat serious conditions such as "deep thrombosis", "pumonary embolisms", and other heart  problems.  The amount of time that you may need off of the medication may also vary with the medication and the reason for which you were taking it.  If you are taking any of these medications, please make sure you notify your pain physician before you undergo any procedures.         Radiofrequency Lesioning Introduction Radiofrequency lesioning is a procedure that is performed to relieve pain. The procedure is often used for back, neck, or arm pain. Radiofrequency lesioning involves the use of a machine that creates radio waves to make heat. During the procedure, the heat is applied to the nerve that carries the pain signal. The heat damages the nerve and interferes with the pain signal. Pain relief usually starts about 2 weeks after the procedure and lasts for 6 months to 1 year. Tell a health care provider about:  Any allergies you have.  All medicines you are taking, including vitamins, herbs, eye drops, creams, and over-the-counter medicines.  Any problems you or family members have had with anesthetic medicines.  Any blood disorders you have.  Any surgeries you have had.  Any medical conditions you have.  Whether you are pregnant or may be pregnant. What are the risks? Generally, this is a safe procedure. However, problems may occur, including:  Pain or soreness at the injection site.  Infection at the injection site.  Damage to nerves or blood vessels. What happens before the procedure?  Ask your health care provider about:  Changing or stopping your regular medicines. This is especially important if you are taking diabetes medicines or blood thinners.  Taking medicines such as aspirin and ibuprofen. These medicines can thin your blood. Do not take these medicines before your procedure if your health care provider instructs you not to.  Follow instructions from your health care provider about eating or drinking restrictions.  Plan to have someone take you home  after the procedure.  If you go home right after the procedure, plan to have someone with you for 24 hours. What happens during the procedure?  You will be given one or more of the following:  A medicine to help you relax (sedative).  A medicine to numb the area (local anesthetic).  You will be awake during the procedure. You will need to be able to talk with the health care provider during the procedure.  With the help of a type of X-ray (fluoroscopy), the health care provider will insert a radiofrequency needle into the area to be treated.  Next, a wire that carries the radio waves (electrode) will be put through the radiofrequency needle. An electrical pulse will be sent through the electrode to verify the correct nerve. You will feel a tingling sensation, and you may have muscle twitching.  Then, the tissue that is around the needle tip will be heated by an electric current that   is passed using the radiofrequency machine. This will numb the nerves.  A bandage (dressing) will be put on the insertion area after the procedure is done. The procedure may vary among health care providers and hospitals. What happens after the procedure?  Your blood pressure, heart rate, breathing rate, and blood oxygen level will be monitored often until the medicines you were given have worn off.  Return to your normal activities as directed by your health care provider. This information is not intended to replace advice given to you by your health care provider. Make sure you discuss any questions you have with your health care provider. Document Released: 04/07/2011 Document Revised: 01/15/2016 Document Reviewed: 09/16/2014  2017 [employer]

## 2016-09-14 ENCOUNTER — Ambulatory Visit: Payer: Medicare Other | Attending: Pain Medicine | Admitting: Pain Medicine

## 2016-09-14 ENCOUNTER — Encounter: Payer: Self-pay | Admitting: Pain Medicine

## 2016-09-14 VITALS — BP 118/79 | HR 88 | Temp 98.6°F | Resp 16 | Ht 69.0 in | Wt 165.0 lb

## 2016-09-14 DIAGNOSIS — Z7982 Long term (current) use of aspirin: Secondary | ICD-10-CM | POA: Diagnosis not present

## 2016-09-14 DIAGNOSIS — M109 Gout, unspecified: Secondary | ICD-10-CM | POA: Diagnosis not present

## 2016-09-14 DIAGNOSIS — N3281 Overactive bladder: Secondary | ICD-10-CM | POA: Insufficient documentation

## 2016-09-14 DIAGNOSIS — R55 Syncope and collapse: Secondary | ICD-10-CM | POA: Diagnosis not present

## 2016-09-14 DIAGNOSIS — I495 Sick sinus syndrome: Secondary | ICD-10-CM | POA: Insufficient documentation

## 2016-09-14 DIAGNOSIS — I5032 Chronic diastolic (congestive) heart failure: Secondary | ICD-10-CM | POA: Insufficient documentation

## 2016-09-14 DIAGNOSIS — Z950 Presence of cardiac pacemaker: Secondary | ICD-10-CM | POA: Insufficient documentation

## 2016-09-14 DIAGNOSIS — Z841 Family history of disorders of kidney and ureter: Secondary | ICD-10-CM | POA: Insufficient documentation

## 2016-09-14 DIAGNOSIS — K219 Gastro-esophageal reflux disease without esophagitis: Secondary | ICD-10-CM | POA: Insufficient documentation

## 2016-09-14 DIAGNOSIS — G894 Chronic pain syndrome: Secondary | ICD-10-CM | POA: Diagnosis not present

## 2016-09-14 DIAGNOSIS — N183 Chronic kidney disease, stage 3 (moderate): Secondary | ICD-10-CM | POA: Insufficient documentation

## 2016-09-14 DIAGNOSIS — F17210 Nicotine dependence, cigarettes, uncomplicated: Secondary | ICD-10-CM | POA: Insufficient documentation

## 2016-09-14 DIAGNOSIS — G2581 Restless legs syndrome: Secondary | ICD-10-CM | POA: Insufficient documentation

## 2016-09-14 DIAGNOSIS — I4892 Unspecified atrial flutter: Secondary | ICD-10-CM | POA: Diagnosis not present

## 2016-09-14 DIAGNOSIS — M961 Postlaminectomy syndrome, not elsewhere classified: Secondary | ICD-10-CM | POA: Diagnosis not present

## 2016-09-14 DIAGNOSIS — M545 Low back pain: Secondary | ICD-10-CM | POA: Diagnosis not present

## 2016-09-14 DIAGNOSIS — Z803 Family history of malignant neoplasm of breast: Secondary | ICD-10-CM | POA: Insufficient documentation

## 2016-09-14 DIAGNOSIS — I341 Nonrheumatic mitral (valve) prolapse: Secondary | ICD-10-CM | POA: Diagnosis not present

## 2016-09-14 DIAGNOSIS — J449 Chronic obstructive pulmonary disease, unspecified: Secondary | ICD-10-CM | POA: Insufficient documentation

## 2016-09-14 DIAGNOSIS — R7309 Other abnormal glucose: Secondary | ICD-10-CM | POA: Insufficient documentation

## 2016-09-14 DIAGNOSIS — K5909 Other constipation: Secondary | ICD-10-CM | POA: Insufficient documentation

## 2016-09-14 DIAGNOSIS — M48061 Spinal stenosis, lumbar region without neurogenic claudication: Secondary | ICD-10-CM | POA: Diagnosis not present

## 2016-09-14 DIAGNOSIS — E785 Hyperlipidemia, unspecified: Secondary | ICD-10-CM | POA: Insufficient documentation

## 2016-09-14 DIAGNOSIS — Z888 Allergy status to other drugs, medicaments and biological substances status: Secondary | ICD-10-CM | POA: Diagnosis not present

## 2016-09-14 DIAGNOSIS — N390 Urinary tract infection, site not specified: Secondary | ICD-10-CM | POA: Diagnosis not present

## 2016-09-14 DIAGNOSIS — M1288 Other specific arthropathies, not elsewhere classified, other specified site: Secondary | ICD-10-CM

## 2016-09-14 DIAGNOSIS — M5416 Radiculopathy, lumbar region: Secondary | ICD-10-CM | POA: Diagnosis not present

## 2016-09-14 DIAGNOSIS — M75102 Unspecified rotator cuff tear or rupture of left shoulder, not specified as traumatic: Secondary | ICD-10-CM | POA: Diagnosis not present

## 2016-09-14 DIAGNOSIS — Z8249 Family history of ischemic heart disease and other diseases of the circulatory system: Secondary | ICD-10-CM | POA: Insufficient documentation

## 2016-09-14 DIAGNOSIS — G8929 Other chronic pain: Secondary | ICD-10-CM

## 2016-09-14 DIAGNOSIS — M47816 Spondylosis without myelopathy or radiculopathy, lumbar region: Secondary | ICD-10-CM

## 2016-09-14 DIAGNOSIS — F1190 Opioid use, unspecified, uncomplicated: Secondary | ICD-10-CM

## 2016-09-14 DIAGNOSIS — Z9889 Other specified postprocedural states: Secondary | ICD-10-CM | POA: Insufficient documentation

## 2016-09-14 DIAGNOSIS — Z79891 Long term (current) use of opiate analgesic: Secondary | ICD-10-CM

## 2016-09-14 DIAGNOSIS — I4891 Unspecified atrial fibrillation: Secondary | ICD-10-CM | POA: Insufficient documentation

## 2016-09-14 DIAGNOSIS — Z79899 Other long term (current) drug therapy: Secondary | ICD-10-CM | POA: Diagnosis not present

## 2016-09-14 DIAGNOSIS — Z823 Family history of stroke: Secondary | ICD-10-CM | POA: Insufficient documentation

## 2016-09-14 MED ORDER — OXYCODONE HCL 20 MG PO TABS: 20.0000 mg | ORAL_TABLET | Freq: Three times a day (TID) | ORAL | 0 refills | Status: DC | PRN

## 2016-09-14 MED ORDER — ROPINIROLE HCL 2 MG PO TABS: 2.0000 mg | ORAL_TABLET | Freq: Every day | ORAL | 0 refills | Status: DC

## 2016-09-14 NOTE — Progress Notes (Signed)
Nursing Pain Medication Assessment:  Safety precautions to be maintained throughout the outpatient stay will include: orient to surroundings, keep bed in low position, maintain call bell within reach at all times, provide assistance with transfer out of bed and ambulation.  Medication Inspection Compliance: Pill count conducted under aseptic conditions, in front of the patient. Neither the pills nor the bottle was removed from the patient's sight at any time. Once count was completed pills were immediately returned to the patient in their original bottle.  Medication: Oxycodone IR Pill Count: 4 of 90 pills remain Bottle Appearance: Standard pharmacy container. Clearly labeled. Filled Date: 12 /23 / 2017 Medication last intake: 09/14/2016

## 2016-09-14 NOTE — Patient Instructions (Signed)
Make sure you stop your ASA and Eliquis 3 days before procedure, when we schedule it. We will have to get approved with your insurance and will call you for appointment  GENERAL RISKS AND COMPLICATIONS  What are the risk, side effects and possible complications? Generally speaking, most procedures are safe.  However, with any procedure there are risks, side effects, and the possibility of complications.  The risks and complications are dependent upon the sites that are lesioned, or the type of nerve block to be performed.  The closer the procedure is to the spine, the more serious the risks are.  Great care is taken when placing the radio frequency needles, block needles or lesioning probes, but sometimes complications can occur. 1. Infection: Any time there is an injection through the skin, there is a risk of infection.  This is why sterile conditions are used for these blocks.  There are four possible types of infection. 1. Localized skin infection. 2. Central Nervous System Infection-This can be in the form of Meningitis, which can be deadly. 3. Epidural Infections-This can be in the form of an epidural abscess, which can cause pressure inside of the spine, causing compression of the spinal cord with subsequent paralysis. This would require an emergency surgery to decompress, and there are no guarantees that the patient would recover from the paralysis. 4. Discitis-This is an infection of the intervertebral discs.  It occurs in about 1% of discography procedures.  It is difficult to treat and it may lead to surgery.        2. Pain: the needles have to go through skin and soft tissues, will cause soreness.       3. Damage to internal structures:  The nerves to be lesioned may be near blood vessels or    other nerves which can be potentially damaged.       4. Bleeding: Bleeding is more common if the patient is taking blood thinners such as  aspirin, Coumadin, Ticiid, Plavix, etc., or if he/she have  some genetic predisposition  such as hemophilia. Bleeding into the spinal canal can cause compression of the spinal  cord with subsequent paralysis.  This would require an emergency surgery to  decompress and there are no guarantees that the patient would recover from the  paralysis.       5. Pneumothorax:  Puncturing of a lung is a possibility, every time a needle is introduced in  the area of the chest or upper back.  Pneumothorax refers to free air around the  collapsed lung(s), inside of the thoracic cavity (chest cavity).  Another two possible  complications related to a similar event would include: Hemothorax and Chylothorax.   These are variations of the Pneumothorax, where instead of air around the collapsed  lung(s), you may have blood or chyle, respectively.       6. Spinal headaches: They may occur with any procedures in the area of the spine.       7. Persistent CSF (Cerebro-Spinal Fluid) leakage: This is a rare problem, but may occur  with prolonged intrathecal or epidural catheters either due to the formation of a fistulous  track or a dural tear.       8. Nerve damage: By working so close to the spinal cord, there is always a possibility of  nerve damage, which could be as serious as a permanent spinal cord injury with  paralysis.       9. Death:  Although rare, severe deadly  allergic reactions known as "Anaphylactic  reaction" can occur to any of the medications used.      10. Worsening of the symptoms:  We can always make thing worse.  What are the chances of something like this happening? Chances of any of this occuring are extremely low.  By statistics, you have more of a chance of getting killed in a motor vehicle accident: while driving to the hospital than any of the above occurring .  Nevertheless, you should be aware that they are possibilities.  In general, it is similar to taking a shower.  Everybody knows that you can slip, hit your head and get killed.  Does that mean that you  should not shower again?  Nevertheless always keep in mind that statistics do not mean anything if you happen to be on the wrong side of them.  Even if a procedure has a 1 (one) in a 1,000,000 (million) chance of going wrong, it you happen to be that one..Also, keep in mind that by statistics, you have more of a chance of having something go wrong when taking medications.  Who should not have this procedure? If you are on a blood thinning medication (e.g. Coumadin, Plavix, see list of "Blood Thinners"), or if you have an active infection going on, you should not have the procedure.  If you are taking any blood thinners, please inform your physician.  How should I prepare for this procedure?  Do not eat or drink anything at least six hours prior to the procedure.  Bring a driver with you .  It cannot be a taxi.  Come accompanied by an adult that can drive you back, and that is strong enough to help you if your legs get weak or numb from the local anesthetic.  Take all of your medicines the morning of the procedure with just enough water to swallow them.  If you have diabetes, make sure that you are scheduled to have your procedure done first thing in the morning, whenever possible.  If you have diabetes, take only half of your insulin dose and notify our nurse that you have done so as soon as you arrive at the clinic.  If you are diabetic, but only take blood sugar pills (oral hypoglycemic), then do not take them on the morning of your procedure.  You may take them after you have had the procedure.  Do not take aspirin or any aspirin-containing medications, at least eleven (11) days prior to the procedure.  They may prolong bleeding.  Wear loose fitting clothing that may be easy to take off and that you would not mind if it got stained with Betadine or blood.  Do not wear any jewelry or perfume  Remove any nail coloring.  It will interfere with some of our monitoring equipment.  NOTE:  Remember that this is not meant to be interpreted as a complete list of all possible complications.  Unforeseen problems may occur.  BLOOD THINNERS The following drugs contain aspirin or other products, which can cause increased bleeding during surgery and should not be taken for 2 weeks prior to and 1 week after surgery.  If you should need take something for relief of minor pain, you may take acetaminophen which is found in Tylenol,m Datril, Anacin-3 and Panadol. It is not blood thinner. The products listed below are.  Do not take any of the products listed below in addition to any listed on your instruction sheet.  A.P.C or A.P.C  with Codeine Codeine Phosphate Capsules #3 Ibuprofen Ridaura  ABC compound Congesprin Imuran rimadil  Advil Cope Indocin Robaxisal  Alka-Seltzer Effervescent Pain Reliever and Antacid Coricidin or Coricidin-D  Indomethacin Rufen  Alka-Seltzer plus Cold Medicine Cosprin Ketoprofen S-A-C Tablets  Anacin Analgesic Tablets or Capsules Coumadin Korlgesic Salflex  Anacin Extra Strength Analgesic tablets or capsules CP-2 Tablets Lanoril Salicylate  Anaprox Cuprimine Capsules Levenox Salocol  Anexsia-D Dalteparin Magan Salsalate  Anodynos Darvon compound Magnesium Salicylate Sine-off  Ansaid Dasin Capsules Magsal Sodium Salicylate  Anturane Depen Capsules Marnal Soma  APF Arthritis pain formula Dewitt's Pills Measurin Stanback  Argesic Dia-Gesic Meclofenamic Sulfinpyrazone  Arthritis Bayer Timed Release Aspirin Diclofenac Meclomen Sulindac  Arthritis pain formula Anacin Dicumarol Medipren Supac  Analgesic (Safety coated) Arthralgen Diffunasal Mefanamic Suprofen  Arthritis Strength Bufferin Dihydrocodeine Mepro Compound Suprol  Arthropan liquid Dopirydamole Methcarbomol with Aspirin Synalgos  ASA tablets/Enseals Disalcid Micrainin Tagament  Ascriptin Doan's Midol Talwin  Ascriptin A/D Dolene Mobidin Tanderil  Ascriptin Extra Strength Dolobid Moblgesic Ticlid   Ascriptin with Codeine Doloprin or Doloprin with Codeine Momentum Tolectin  Asperbuf Duoprin Mono-gesic Trendar  Aspergum Duradyne Motrin or Motrin IB Triminicin  Aspirin plain, buffered or enteric coated Durasal Myochrisine Trigesic  Aspirin Suppositories Easprin Nalfon Trillsate  Aspirin with Codeine Ecotrin Regular or Extra Strength Naprosyn Uracel  Atromid-S Efficin Naproxen Ursinus  Auranofin Capsules Elmiron Neocylate Vanquish  Axotal Emagrin Norgesic Verin  Azathioprine Empirin or Empirin with Codeine Normiflo Vitamin E  Azolid Emprazil Nuprin Voltaren  Bayer Aspirin plain, buffered or children's or timed BC Tablets or powders Encaprin Orgaran Warfarin Sodium  Buff-a-Comp Enoxaparin Orudis Zorpin  Buff-a-Comp with Codeine Equegesic Os-Cal-Gesic   Buffaprin Excedrin plain, buffered or Extra Strength Oxalid   Bufferin Arthritis Strength Feldene Oxphenbutazone   Bufferin plain or Extra Strength Feldene Capsules Oxycodone with Aspirin   Bufferin with Codeine Fenoprofen Fenoprofen Pabalate or Pabalate-SF   Buffets II Flogesic Panagesic   Buffinol plain or Extra Strength Florinal or Florinal with Codeine Panwarfarin   Buf-Tabs Flurbiprofen Penicillamine   Butalbital Compound Four-way cold tablets Penicillin   Butazolidin Fragmin Pepto-Bismol   Carbenicillin Geminisyn Percodan   Carna Arthritis Reliever Geopen Persantine   Carprofen Gold's salt Persistin   Chloramphenicol Goody's Phenylbutazone   Chloromycetin Haltrain Piroxlcam   Clmetidine heparin Plaquenil   Cllnoril Hyco-pap Ponstel   Clofibrate Hydroxy chloroquine Propoxyphen         Before stopping any of these medications, be sure to consult the physician who ordered them.  Some, such as Coumadin (Warfarin) are ordered to prevent or treat serious conditions such as "deep thrombosis", "pumonary embolisms", and other heart problems.  The amount of time that you may need off of the medication may also vary with the medication  and the reason for which you were taking it.  If you are taking any of these medications, please make sure you notify your pain physician before you undergo any procedures.         Radiofrequency Lesioning Introduction Radiofrequency lesioning is a procedure that is performed to relieve pain. The procedure is often used for back, neck, or arm pain. Radiofrequency lesioning involves the use of a machine that creates radio waves to make heat. During the procedure, the heat is applied to the nerve that carries the pain signal. The heat damages the nerve and interferes with the pain signal. Pain relief usually starts about 2 weeks after the procedure and lasts for 6 months to 1 year. Tell a health care provider about:  Any allergies you have.  All medicines you are taking, including vitamins, herbs, eye drops, creams, and over-the-counter medicines.  Any problems you or family members have had with anesthetic medicines.  Any blood disorders you have.  Any surgeries you have had.  Any medical conditions you have.  Whether you are pregnant or may be pregnant. What are the risks? Generally, this is a safe procedure. However, problems may occur, including:  Pain or soreness at the injection site.  Infection at the injection site.  Damage to nerves or blood vessels. What happens before the procedure?  Ask your health care provider about:  Changing or stopping your regular medicines. This is especially important if you are taking diabetes medicines or blood thinners.  Taking medicines such as aspirin and ibuprofen. These medicines can thin your blood. Do not take these medicines before your procedure if your health care provider instructs you not to.  Follow instructions from your health care provider about eating or drinking restrictions.  Plan to have someone take you home after the procedure.  If you go home right after the procedure, plan to have someone with you for 24  hours. What happens during the procedure?  You will be given one or more of the following:  A medicine to help you relax (sedative).  A medicine to numb the area (local anesthetic).  You will be awake during the procedure. You will need to be able to talk with the health care provider during the procedure.  With the help of a type of X-ray (fluoroscopy), the health care provider will insert a radiofrequency needle into the area to be treated.  Next, a wire that carries the radio waves (electrode) will be put through the radiofrequency needle. An electrical pulse will be sent through the electrode to verify the correct nerve. You will feel a tingling sensation, and you may have muscle twitching.  Then, the tissue that is around the needle tip will be heated by an electric current that is passed using the radiofrequency machine. This will numb the nerves.  A bandage (dressing) will be put on the insertion area after the procedure is done. The procedure may vary among health care providers and hospitals. What happens after the procedure?  Your blood pressure, heart rate, breathing rate, and blood oxygen level will be monitored often until the medicines you were given have worn off.  Return to your normal activities as directed by your health care provider. This information is not intended to replace advice given to you by your health care provider. Make sure you discuss any questions you have with your health care provider. Document Released: 04/07/2011 Document Revised: 01/15/2016 Document Reviewed: 09/16/2014  2017 [employer]vier

## 2016-10-07 DIAGNOSIS — I5032 Chronic diastolic (congestive) heart failure: Secondary | ICD-10-CM | POA: Diagnosis not present

## 2016-10-07 DIAGNOSIS — Z950 Presence of cardiac pacemaker: Secondary | ICD-10-CM | POA: Diagnosis not present

## 2016-10-07 DIAGNOSIS — I495 Sick sinus syndrome: Secondary | ICD-10-CM | POA: Diagnosis not present

## 2016-10-07 DIAGNOSIS — N183 Chronic kidney disease, stage 3 (moderate): Secondary | ICD-10-CM | POA: Diagnosis not present

## 2016-10-07 DIAGNOSIS — I10 Essential (primary) hypertension: Secondary | ICD-10-CM | POA: Diagnosis not present

## 2016-10-11 DIAGNOSIS — J45998 Other asthma: Secondary | ICD-10-CM | POA: Diagnosis not present

## 2016-11-01 ENCOUNTER — Ambulatory Visit: Payer: Medicare Other | Admitting: Family Medicine

## 2016-11-08 ENCOUNTER — Ambulatory Visit (INDEPENDENT_AMBULATORY_CARE_PROVIDER_SITE_OTHER): Payer: Medicare Other | Admitting: Family Medicine

## 2016-11-08 ENCOUNTER — Encounter: Payer: Self-pay | Admitting: Family Medicine

## 2016-11-08 VITALS — BP 136/73 | HR 89 | Temp 97.8°F | Resp 16 | Ht 69.0 in | Wt 169.0 lb

## 2016-11-08 DIAGNOSIS — R350 Frequency of micturition: Secondary | ICD-10-CM

## 2016-11-08 DIAGNOSIS — Z950 Presence of cardiac pacemaker: Secondary | ICD-10-CM | POA: Diagnosis not present

## 2016-11-08 DIAGNOSIS — J431 Panlobular emphysema: Secondary | ICD-10-CM

## 2016-11-08 DIAGNOSIS — I5032 Chronic diastolic (congestive) heart failure: Secondary | ICD-10-CM | POA: Diagnosis not present

## 2016-11-08 DIAGNOSIS — R300 Dysuria: Secondary | ICD-10-CM | POA: Diagnosis not present

## 2016-11-08 DIAGNOSIS — J301 Allergic rhinitis due to pollen: Secondary | ICD-10-CM

## 2016-11-08 DIAGNOSIS — I495 Sick sinus syndrome: Secondary | ICD-10-CM

## 2016-11-08 DIAGNOSIS — I10 Essential (primary) hypertension: Secondary | ICD-10-CM | POA: Diagnosis not present

## 2016-11-08 DIAGNOSIS — C50911 Malignant neoplasm of unspecified site of right female breast: Secondary | ICD-10-CM | POA: Diagnosis not present

## 2016-11-08 DIAGNOSIS — J45998 Other asthma: Secondary | ICD-10-CM | POA: Diagnosis not present

## 2016-11-08 LAB — POCT URINALYSIS DIPSTICK
BILIRUBIN UA: NEGATIVE
GLUCOSE UA: NEGATIVE
Ketones, UA: NEGATIVE
NITRITE UA: NEGATIVE
PH UA: 5 (ref 5.0–8.0)
Protein, UA: NEGATIVE
RBC UA: NEGATIVE
SPEC GRAV UA: 1.01 (ref 1.030–1.035)
UROBILINOGEN UA: 0.2 (ref ?–2.0)

## 2016-11-08 MED ORDER — CEPHALEXIN 250 MG PO CAPS: 250.0000 mg | ORAL_CAPSULE | Freq: Four times a day (QID) | ORAL | 0 refills | Status: DC

## 2016-11-08 NOTE — Progress Notes (Signed)
Name: Kristin [name]   MRN: [id number]    DOB: 1930-03-28   Date:11/08/2016       Progress Note  Subjective  Chief Complaint  Chief Complaint  Patient presents with  . Hypertension  . Urinary Tract Infection  . Allergic Rhinitis     HPI Here for f/u of HBP.  Taking Lisinopril.  Also with atrial fib with a pacemaker implanted.  Has seasonal allergies doing well at present.  Has had more dysuria again and frequency.  No problem-specific Assessment & Plan notes found for this encounter.   Past Medical History:  Diagnosis Date  . Arthritis   . Arthritis   . Atrial fibrillation ([place])   . Back pain    lower back chronic  . Bradycardia   . Breast cancer ([place]) 2004   right breast cancer  . Cancer [facility]) 2004   rt breast cancer-post lumpectomy- chemo/rad  . CHF (congestive heart failure) ([place])   . Chronic back pain   . CKD (chronic kidney disease)   . COPD (chronic obstructive pulmonary disease) (HCC)    wears O2 at 2L via [place] at night  . Cystocele   . Decubitus ulcers   . Gout   . Hematuria   . Hepatitis C   . Hyperlipidemia   . Hypertension   . Hypomagnesemia 06/25/2015  . OAB (overactive bladder)   . Overactive bladder   . Restless leg   . Shoulder pain, bilateral   . Urinary frequency   . UTI (lower urinary tract infection)   . Vaginal atrophy     Past Surgical History:  Procedure Laterality Date  . ABDOMINAL HYSTERECTOMY    . BACK SURGERY    . BREAST SURGERY     rt lumpectomy  . CARPAL TUNNEL RELEASE    . CATARACT EXTRACTION W/PHACO  10/19/2011   Procedure: CATARACT EXTRACTION PHACO AND INTRAOCULAR LENS PLACEMENT (IOC);  Surgeon: Elta Guadeloupe T. Gershon Crane, MD;  Location: [facility];  Service: Ophthalmology;  Laterality: Right;  CDE:10.81  . CATARACT EXTRACTION W/PHACO  11/02/2011   Procedure: CATARACT EXTRACTION PHACO AND INTRAOCULAR LENS PLACEMENT (IOC);  Surgeon: Elta Guadeloupe T. Gershon Crane, MD;  Location: [facility];  Service: Ophthalmology;  Laterality: Left;  CDE 8.60  . COLON SURGERY     . JOINT REPLACEMENT     bilateral TKA  . PACEMAKER INSERTION N/A 12/24/2015   Procedure: INSERTION PACEMAKER;  Surgeon: Isaias Cowman, MD;  Location: [facility] ORS;  Service: Cardiovascular;  Laterality: N/A;  . ROTATOR CUFF REPAIR     bilateral    Family History  Problem Relation Age of Onset  . Kidney disease Brother   . Heart disease Father   . Heart disease Mother   . Stroke Mother   . Cancer Sister   . Breast cancer Sister 43  . Breast cancer Paternal Aunt   . Anesthesia problems Neg Hx   . Hypotension Neg Hx   . Malignant hyperthermia Neg Hx   . Pseudochol deficiency Neg Hx     Social History   Social History  . Marital status: Single    Spouse name: N/A  . Number of children: N/A  . Years of education: N/A   Occupational History  . retired    Social History Main Topics  . Smoking status: Former Smoker    Packs/day: 1.25    Years: 40.00    Types: Cigarettes    Quit date: 12/16/1998  . Smokeless tobacco: Never Used  . Alcohol use No  .  Drug use: No  . Sexual activity: Yes    Birth control/ protection: Surgical   Other Topics Concern  . Not on file   Social History Narrative  . No narrative on file     Current Outpatient Prescriptions:  .  albuterol (PROVENTIL) (2.5 MG/3ML) 0.083% nebulizer solution, Take 3 mLs (2.5 mg total) by nebulization every 8 (eight) hours as needed. Shortness of breath and wheezing, Disp: 75 mL, Rfl: 12 .  apixaban (ELIQUIS) 5 MG TABS tablet, Take 1 tablet (5 mg total) by mouth 2 (two) times daily., Disp: 60 tablet, Rfl: 0 .  aspirin EC 325 MG tablet, Take 325 mg by mouth daily. , Disp: , Rfl:  .  bisacodyl (DULCOLAX) 5 MG EC tablet, Take 2 tablets (10 mg total) by mouth daily as needed for moderate constipation., Disp: 60 tablet, Rfl: 2 .  Cholecalciferol (VITAMIN D) 2000 units tablet, Take 2,000 Units by mouth daily. , Disp: , Rfl:  .  furosemide (LASIX) 20 MG tablet, Take 1 tablet (20 mg total) by mouth daily., Disp: 90  tablet, Rfl: 3 .  levocetirizine (XYZAL) 5 MG tablet, Take 1 tablet (5 mg total) by mouth every evening., Disp: 30 tablet, Rfl: 12 .  lisinopril (PRINIVIL,ZESTRIL) 10 MG tablet, TAKE 1 TABLET BY MOUTH ONCE DAILY., Disp: 90 tablet, Rfl: 3 .  Oxycodone HCl 20 MG TABS, Take 1 tablet (20 mg total) by mouth every 8 (eight) hours as needed., Disp: 90 tablet, Rfl: 0 .  [START ON 11/13/2016] Oxycodone HCl 20 MG TABS, Take 1 tablet (20 mg total) by mouth every 8 (eight) hours as needed., Disp: 90 tablet, Rfl: 0 .  OXYGEN, Inhale 2 Doses into the lungs at bedtime., Disp: , Rfl:  .  rOPINIRole (REQUIP) 2 MG tablet, Take 1 tablet (2 mg total) by mouth at bedtime., Disp: 90 tablet, Rfl: 0 .  Wheat Dextrin (BENEFIBER DRINK MIX) PACK, Stir 2 teaspoons of Benefiber into 4-8 oz of any non-carbonated beverage or soft food (hot or cold) PO TID as needed, Disp: , Rfl:  .  cephALEXin (KEFLEX) 250 MG capsule, Take 1 capsule (250 mg total) by mouth 4 (four) times daily. Take for 7 days, Disp: 28 capsule, Rfl: 0 .  Oxycodone HCl 20 MG TABS, Take 1 tablet (20 mg total) by mouth every 8 (eight) hours as needed., Disp: 90 tablet, Rfl: 0  Allergies  Allergen Reactions  . Cyclobenzaprine Rash and Hives    Severe Rash  . Flexeril [Cyclobenzaprine Hcl]     Severe Rash  . Vancomycin Rash    [place] Syndrome     Review of Systems  Constitutional: Negative for chills, fever, malaise/fatigue and weight loss.  HENT: Negative for hearing loss and tinnitus.   Eyes: Negative for blurred vision and double vision.  Respiratory: Positive for shortness of breath (with walking up atairs.). Negative for cough and wheezing.   Cardiovascular: Negative for chest pain, palpitations and leg swelling.  Gastrointestinal: Negative for abdominal pain, blood in stool and heartburn.  Genitourinary: Positive for dysuria and frequency. Negative for hematuria and urgency.  Skin: Negative for rash.  Neurological: Negative for dizziness,  tingling, tremors, weakness and headaches.      Objective  Vitals:   11/08/16 1457  BP: 136/73  Pulse: 89  Resp: 16  Temp: 97.8 F (36.6 C)  TempSrc: Oral  Weight: 169 lb (76.7 kg)  Height: '5\' 9"'$  (1.753 m)    Physical Exam  Constitutional: She is oriented to  person, place, and time and well-developed, well-nourished, and in no distress. No distress.  HENT:  Head: Normocephalic and atraumatic.  Eyes: Conjunctivae and EOM are normal. Pupils are equal, round, and reactive to light. No scleral icterus.  Neck: Normal range of motion. Neck supple. Carotid bruit is not present. No thyromegaly present.  Cardiovascular: Normal rate, regular rhythm and normal heart sounds.  Exam reveals no gallop and no friction rub.   No murmur heard. Pulmonary/Chest: Effort normal and breath sounds normal. No respiratory distress. She has no wheezes. She has no rales.  Abdominal: Soft. There is no tenderness.  Musculoskeletal: She exhibits no edema.  Lymphadenopathy:    She has no cervical adenopathy.  Neurological: She is alert and oriented to person, place, and time.  Vitals reviewed.      Recent Results (from the past 2160 hour(s))  BASIC METABOLIC PANEL WITH GFR     Status: Abnormal   Collection Time: 08/31/16 12:01 AM  Result Value Ref Range   Sodium 140 135 - 146 mmol/L   Potassium 4.4 3.5 - 5.3 mmol/L   Chloride 107 98 - 110 mmol/L   CO2 25 20 - 31 mmol/L   Glucose, Bld 72 65 - 99 mg/dL   BUN 28 (H) 7 - 25 mg/dL   Creat 1.26 (H) 0.60 - 0.88 mg/dL    Comment:   For patients > or = 81 years of age: The upper reference limit for Creatinine is approximately 13% higher for people identified as African-American.      Calcium 9.4 8.6 - 10.4 mg/dL   GFR, Est African American 45 (L) >=60 mL/min   GFR, Est Non African American 39 (L) >=60 mL/min  POCT urinalysis dipstick     Status: Abnormal   Collection Time: 11/08/16  3:19 PM  Result Value Ref Range   Color, UA yellow    Clarity,  UA clear    Glucose, UA neg    Bilirubin, UA neg    Ketones, UA neg    Spec Grav, UA 1.010 1.030 - 1.035   Blood, UA neg    pH, UA 5.0 5.0 - 8.0   Protein, UA neg    Urobilinogen, UA 0.2 Negative - 2.0   Nitrite, UA neg    Leukocytes, UA moderate (2+) (A) Negative     Assessment & Plan  Problem List Items Addressed This Visit      Cardiovascular and Mediastinum   Benign essential HTN   Sick sinus syndrome (HCC)   Chronic diastolic CHF (congestive heart failure) (HCC)     Respiratory   Chronic obstructive pulmonary disease (HCC)   Seasonal allergies     Other   Frequency of urination   Relevant Medications   cephALEXin (KEFLEX) 250 MG capsule   Other Relevant Orders   Urine Culture   Breast cancer, right breast (HCC)   Relevant Medications   cephALEXin (KEFLEX) 250 MG capsule   History of cardiac pacemaker in situ    Other Visit Diagnoses    Difficult or painful urination    -  Primary   Relevant Orders   POCT urinalysis dipstick (Completed)      Meds ordered this encounter  Medications  . cephALEXin (KEFLEX) 250 MG capsule    Sig: Take 1 capsule (250 mg total) by mouth 4 (four) times daily. Take for 7 days    Dispense:  28 capsule    Refill:  0   1. Difficult or painful urination  - POCT  urinalysis dipstick  2. Benign essential HTN Cont Lisinopril  3. Sick sinus syndrome ([place])   4. Chronic diastolic CHF (congestive heart failure) (HCC) Cont Lasix  5. Panlobular emphysema ([place])   6. Chronic seasonal allergic rhinitis due to pollen Cont Xyal  7. History of cardiac pacemaker in situ   8. Malignant neoplasm of right female breast, unspecified estrogen receptor status, unspecified site of breast ([place])   9. Frequency of urination  - Urine Culture - cephALEXin (KEFLEX) 250 MG capsule; Take 1 capsule (250 mg total) by mouth 4 (four) times daily. Take for 7 days  Dispense: 28 capsule; Refill: 0

## 2016-11-08 NOTE — Addendum Note (Signed)
Addended by: Devona Konig on: 11/08/2016 04:05 PM   Modules accepted: Orders

## 2016-11-10 LAB — URINE CULTURE

## 2016-11-11 ENCOUNTER — Ambulatory Visit: Payer: Medicare Other | Attending: Pain Medicine | Admitting: Pain Medicine

## 2016-11-11 ENCOUNTER — Encounter: Payer: Self-pay | Admitting: Pain Medicine

## 2016-11-11 VITALS — BP 138/92 | HR 86 | Temp 98.0°F | Resp 18 | Ht 69.0 in | Wt 167.0 lb

## 2016-11-11 DIAGNOSIS — Z7982 Long term (current) use of aspirin: Secondary | ICD-10-CM | POA: Diagnosis not present

## 2016-11-11 DIAGNOSIS — Z79891 Long term (current) use of opiate analgesic: Secondary | ICD-10-CM

## 2016-11-11 DIAGNOSIS — Z79899 Other long term (current) drug therapy: Secondary | ICD-10-CM | POA: Diagnosis not present

## 2016-11-11 DIAGNOSIS — Z96653 Presence of artificial knee joint, bilateral: Secondary | ICD-10-CM | POA: Insufficient documentation

## 2016-11-11 DIAGNOSIS — K5903 Drug induced constipation: Secondary | ICD-10-CM | POA: Diagnosis not present

## 2016-11-11 DIAGNOSIS — M1288 Other specific arthropathies, not elsewhere classified, other specified site: Secondary | ICD-10-CM

## 2016-11-11 DIAGNOSIS — Z9889 Other specified postprocedural states: Secondary | ICD-10-CM | POA: Insufficient documentation

## 2016-11-11 DIAGNOSIS — Z90710 Acquired absence of both cervix and uterus: Secondary | ICD-10-CM | POA: Insufficient documentation

## 2016-11-11 DIAGNOSIS — G8929 Other chronic pain: Secondary | ICD-10-CM

## 2016-11-11 DIAGNOSIS — M25552 Pain in left hip: Secondary | ICD-10-CM | POA: Diagnosis not present

## 2016-11-11 DIAGNOSIS — J449 Chronic obstructive pulmonary disease, unspecified: Secondary | ICD-10-CM | POA: Insufficient documentation

## 2016-11-11 DIAGNOSIS — R55 Syncope and collapse: Secondary | ICD-10-CM | POA: Insufficient documentation

## 2016-11-11 DIAGNOSIS — M961 Postlaminectomy syndrome, not elsewhere classified: Secondary | ICD-10-CM | POA: Diagnosis not present

## 2016-11-11 DIAGNOSIS — R7309 Other abnormal glucose: Secondary | ICD-10-CM | POA: Insufficient documentation

## 2016-11-11 DIAGNOSIS — M545 Low back pain: Secondary | ICD-10-CM

## 2016-11-11 DIAGNOSIS — G894 Chronic pain syndrome: Secondary | ICD-10-CM | POA: Diagnosis not present

## 2016-11-11 DIAGNOSIS — F17210 Nicotine dependence, cigarettes, uncomplicated: Secondary | ICD-10-CM | POA: Insufficient documentation

## 2016-11-11 DIAGNOSIS — M47816 Spondylosis without myelopathy or radiculopathy, lumbar region: Secondary | ICD-10-CM

## 2016-11-11 DIAGNOSIS — N183 Chronic kidney disease, stage 3 (moderate): Secondary | ICD-10-CM | POA: Diagnosis not present

## 2016-11-11 DIAGNOSIS — T402X5A Adverse effect of other opioids, initial encounter: Secondary | ICD-10-CM

## 2016-11-11 DIAGNOSIS — I341 Nonrheumatic mitral (valve) prolapse: Secondary | ICD-10-CM | POA: Insufficient documentation

## 2016-11-11 DIAGNOSIS — Z881 Allergy status to other antibiotic agents status: Secondary | ICD-10-CM | POA: Diagnosis not present

## 2016-11-11 DIAGNOSIS — M109 Gout, unspecified: Secondary | ICD-10-CM | POA: Insufficient documentation

## 2016-11-11 DIAGNOSIS — Z803 Family history of malignant neoplasm of breast: Secondary | ICD-10-CM | POA: Insufficient documentation

## 2016-11-11 DIAGNOSIS — Z8249 Family history of ischemic heart disease and other diseases of the circulatory system: Secondary | ICD-10-CM | POA: Insufficient documentation

## 2016-11-11 DIAGNOSIS — M47896 Other spondylosis, lumbar region: Secondary | ICD-10-CM | POA: Diagnosis not present

## 2016-11-11 DIAGNOSIS — I4891 Unspecified atrial fibrillation: Secondary | ICD-10-CM | POA: Insufficient documentation

## 2016-11-11 DIAGNOSIS — N3281 Overactive bladder: Secondary | ICD-10-CM | POA: Insufficient documentation

## 2016-11-11 DIAGNOSIS — I5032 Chronic diastolic (congestive) heart failure: Secondary | ICD-10-CM | POA: Insufficient documentation

## 2016-11-11 DIAGNOSIS — G2581 Restless legs syndrome: Secondary | ICD-10-CM | POA: Diagnosis not present

## 2016-11-11 DIAGNOSIS — M25551 Pain in right hip: Secondary | ICD-10-CM | POA: Insufficient documentation

## 2016-11-11 DIAGNOSIS — K219 Gastro-esophageal reflux disease without esophagitis: Secondary | ICD-10-CM | POA: Diagnosis not present

## 2016-11-11 DIAGNOSIS — Z950 Presence of cardiac pacemaker: Secondary | ICD-10-CM | POA: Insufficient documentation

## 2016-11-11 DIAGNOSIS — M48061 Spinal stenosis, lumbar region without neurogenic claudication: Secondary | ICD-10-CM | POA: Diagnosis not present

## 2016-11-11 DIAGNOSIS — Z888 Allergy status to other drugs, medicaments and biological substances status: Secondary | ICD-10-CM | POA: Insufficient documentation

## 2016-11-11 DIAGNOSIS — I4892 Unspecified atrial flutter: Secondary | ICD-10-CM | POA: Insufficient documentation

## 2016-11-11 DIAGNOSIS — F1190 Opioid use, unspecified, uncomplicated: Secondary | ICD-10-CM | POA: Diagnosis not present

## 2016-11-11 DIAGNOSIS — Z823 Family history of stroke: Secondary | ICD-10-CM | POA: Insufficient documentation

## 2016-11-11 DIAGNOSIS — M75102 Unspecified rotator cuff tear or rupture of left shoulder, not specified as traumatic: Secondary | ICD-10-CM | POA: Insufficient documentation

## 2016-11-11 DIAGNOSIS — I495 Sick sinus syndrome: Secondary | ICD-10-CM | POA: Insufficient documentation

## 2016-11-11 DIAGNOSIS — E785 Hyperlipidemia, unspecified: Secondary | ICD-10-CM | POA: Insufficient documentation

## 2016-11-11 DIAGNOSIS — K5909 Other constipation: Secondary | ICD-10-CM | POA: Diagnosis not present

## 2016-11-11 DIAGNOSIS — Z841 Family history of disorders of kidney and ureter: Secondary | ICD-10-CM | POA: Insufficient documentation

## 2016-11-11 MED ORDER — OXYCODONE HCL 20 MG PO TABS: 20.0000 mg | ORAL_TABLET | Freq: Three times a day (TID) | ORAL | 0 refills | Status: DC | PRN

## 2016-11-11 MED ORDER — BISACODYL 5 MG PO TBEC: 10.0000 mg | DELAYED_RELEASE_TABLET | Freq: Every day | ORAL | 2 refills | Status: DC | PRN

## 2016-11-11 MED ORDER — ROPINIROLE HCL 2 MG PO TABS: 2.0000 mg | ORAL_TABLET | Freq: Every day | ORAL | 0 refills | Status: DC

## 2016-11-11 MED ORDER — BENEFIBER PO POWD: ORAL | 0 refills | Status: DC

## 2016-11-11 NOTE — Progress Notes (Signed)
Nursing Pain Medication Assessment:  Safety precautions to be maintained throughout the outpatient stay will include: orient to surroundings, keep bed in low position, maintain call bell within reach at all times, provide assistance with transfer out of bed and ambulation.  Medication Inspection Compliance: Ms. Demont did not comply with our request to bring her pills to be counted. She was reminded that bringing the medication bottles, even when empty, is a requirement. Pill/Patch Count: None available to be counted. Bottle Appearance: No container available. Did not bring bottle(s) to appointment. Medication: None brought in. Filled Date: N/A Last Medication intake:  Today

## 2016-11-11 NOTE — Patient Instructions (Signed)
Stop Eliquis for 3 days, but take Aspirin 81 mg instead.  Do not eat or drink for 8 hours, bring a driver, take blood pressure medicine.    Radiofrequency Lesioning Radiofrequency lesioning is a procedure that is performed to relieve pain. The procedure is often used for back, neck, or arm pain. Radiofrequency lesioning involves the use of a machine that creates radio waves to make heat. During the procedure, the heat is applied to the nerve that carries the pain signal. The heat damages the nerve and interferes with the pain signal. Pain relief usually starts about 2 weeks after the procedure and lasts for 6 months to 1 year. Tell a health care provider about:  Any allergies you have.  All medicines you are taking, including vitamins, herbs, eye drops, creams, and over-the-counter medicines.  Any problems you or family members have had with anesthetic medicines.  Any blood disorders you have.  Any surgeries you have had.  Any medical conditions you have.  Whether you are pregnant or may be pregnant. What are the risks? Generally, this is a safe procedure. However, problems may occur, including:  Pain or soreness at the injection site.  Infection at the injection site.  Damage to nerves or blood vessels. What happens before the procedure?  Ask your health care provider about:  Changing or stopping your regular medicines. This is especially important if you are taking diabetes medicines or blood thinners.  Taking medicines such as aspirin and ibuprofen. These medicines can thin your blood. Do not take these medicines before your procedure if your health care provider instructs you not to.  Follow instructions from your health care provider about eating or drinking restrictions.  Plan to have someone take you home after the procedure.  If you go home right after the procedure, plan to have someone with you for 24 hours. What happens during the procedure?  You will be given  one or more of the following:  A medicine to help you relax (sedative).  A medicine to numb the area (local anesthetic).  You will be awake during the procedure. You will need to be able to talk with the health care provider during the procedure.  With the help of a type of X-ray (fluoroscopy), the health care provider will insert a radiofrequency needle into the area to be treated.  Next, a wire that carries the radio waves (electrode) will be put through the radiofrequency needle. An electrical pulse will be sent through the electrode to verify the correct nerve. You will feel a tingling sensation, and you may have muscle twitching.  Then, the tissue that is around the needle tip will be heated by an electric current that is passed using the radiofrequency machine. This will numb the nerves.  A bandage (dressing) will be put on the insertion area after the procedure is done. The procedure may vary among health care providers and hospitals. What happens after the procedure?  Your blood pressure, heart rate, breathing rate, and blood oxygen level will be monitored often until the medicines you were given have worn off.  Return to your normal activities as directed by your health care provider. This information is not intended to replace advice given to you by your health care provider. Make sure you discuss any questions you have with your health care provider. Document Released: 04/07/2011 Document Revised: 01/15/2016 Document Reviewed: 09/16/2014 [employer] Interactive Patient Education  2017 [employer].

## 2016-11-11 NOTE — Progress Notes (Signed)
Patient's Name: Kristin [name]  MRN: [id number]  Referring Provider: Arlis Porta., MD  DOB: [birth year]  PCP: Mikey College, NP  DOS: 11/11/2016  Note by: Kathlen Brunswick. Dossie Arbour, MD  Service setting: Ambulatory outpatient  Specialty: Interventional Pain Management  Location: [facility] (AMB) Pain Management Facility    Patient type: Established   Primary Reason(s) for Visit: Encounter for prescription drug management (Level of risk: moderate) CC: Back Pain (low) and Hip Pain (bilateral)  HPI  Kristin [name] is a 83 y.o. year old, female patient, who comes today for a medication management evaluation. She has Abnormal glucose tolerance test; Chronic diastolic heart failure ([place]); Chronic kidney disease (CKD), stage III (moderate); CAFL (chronic airflow limitation) ([place]); Complete rotator cuff rupture of shoulder (Left); CCF (congestive cardiac failure) (HCC); DDD (degenerative disc disease), lumbosacral; Gout; Essential (primary) hypertension; Benign essential HTN; BP (high blood pressure); H/O cardiac catheterization; Arrhythmia, sinus node ([place]); Upper respiratory infection, viral; Lumbar canal stenosis; Urinary frequency; Chronic obstructive pulmonary disease ([place]); Congestive heart failure ([place]); Degeneration of intervertebral disc of lumbosacral region; Chemical diabetes; History of cardiac catheterization; Sinoatrial node dysfunction ([place]); Need for influenza vaccination; Frequency of urination; Seasonal allergies; Breast cancer, right breast ([place]); Chronic pain syndrome; Chronic low back pain (Location of Primary Source of Pain) (Bilateral) (L>R); Lumbar facet syndrome (Bilateral) (L>R); Lumbar spondylosis; Chronic lumbar radicular pain (Bilateral) (L>R) (L5 Dermat[place]); Restless leg syndrome; Myofascial pain; Neuropathic pain; Neurogenic pain; Long term current use of opiate analgesic; Long term prescription opiate use; Opiate use (90 MME/Day); Encounter for therapeutic drug level monitoring; Chronic  constipation; Opioid-induced constipation (OIC); Vitamin D insufficiency; Grade 1 Retrolisthesis of L1 over L2; Lumbar facet hypertrophy (L1-2, L2-3, and L4-5); Stenosis of lateral recess of lumbar spine (L1-2 and L4-5); 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); Epidural fibrosis; Lumbar postlaminectomy syndrome; Chronic hip pain (bilateral) (L>R); 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); 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); Hypomagnesemia; Osteoporosis; Chronic sacroiliac joint pain (Left); MRSA (methicillin resistant staph aureus) culture positive; Mitral valve prolapse; Near syncope; New onset atrial flutter ([place]); UTI (lower urinary tract infection); Rotator cuff syndrome; Disorder of rotator cuff; GERD (gastroesophageal reflux disease); Sick sinus syndrome ([employer]); History of cardiac pacemaker in situ; Chronic shoulder pain (S/P replacement) (Right); Osteoarthritis, multiple sites; Osteoarthritis of shoulder (Right); Shoulder pain S/P prostheses (Right); Chronic diastolic CHF (congestive heart failure) ([place]); Chronic kidney disease, stage III (moderate); S/P cardiac pacemaker procedure; and Tear of rotator cuff on her problem list. Her primarily concern today is the Back Pain (low) and Hip Pain (bilateral)  Pain Assessment: Self-Reported Pain Score: 5 /10 Clinically the patient looks like a 2/10 Reported level is inconsistent with clinical observations. Information on the proper use of the pain scale provided to the patient today Pain Type: Chronic pain Pain Location: Back (bilateral hips) Pain Orientation: Lower Pain Descriptors / Indicators: Aching, Constant Pain Frequency: Constant  Kristin [name] was last scheduled for an appointment on 09/14/2016 for medication management. During today's appointment we reviewed Kristin [name]'s chronic pain status, as well as her outpatient medication regimen.  The patient  reports that she does not use drugs. Her body  mass index is 24.66 kg/m.  Further details on both, my assessment(s), as well as the proposed treatment plan, please see below.  Controlled Substance Pharmacotherapy Assessment REMS (Risk Evaluation and Mitigation Strategy)  Analgesic:Oxycodone IR 20 mg every 8 hours (60 mg/day) MME/day:90 mg/day Zenovia Jarred, RN  11/11/2016 12:56 PM  Signed Nursing Pain Medication  Assessment:  Safety precautions to be maintained throughout the outpatient stay will include: orient to surroundings, keep bed in low position, maintain call bell within reach at all times, provide assistance with transfer out of bed and ambulation.  Medication Inspection Compliance: Kristin [name] did not comply with our request to bring her pills to be counted. She was reminded that bringing the medication bottles, even when empty, is a requirement. Pill/Patch Count: None available to be counted. Bottle Appearance: No container available. Did not bring bottle(s) to appointment. Medication: None brought in. Filled Date: N/A Last Medication intake:  Today   Pharmacokinetics: Liberation and absorption (onset of action): WNL Distribution (time to peak effect): WNL Metabolism and excretion (duration of action): WNL         Pharmacodynamics: Desired effects: Analgesia: Kristin [name] reports >50% benefit. Functional ability: Patient reports that medication allows her to accomplish basic ADLs Clinically meaningful improvement in function (CMIF): Sustained CMIF goals met Perceived effectiveness: Described as relatively effective, allowing for increase in activities of daily living (ADL) Undesirable effects: Side-effects or Adverse reactions: None reported Monitoring: [place] PMP: Online review of the past 70-monthperiod conducted. Compliant with practice rules and regulations List of all UDS test(s) done:  Lab Results  Component Value Date   TOXASSSELUR FINAL 12/18/2015   TOXASSSELUR FINAL 09/24/2015   TOXASSSELUR FINAL 06/25/2015    Last UDS on record: ToxAssure Select 13  Date Value Ref Range Status  12/18/2015 FINAL  Final    Comment:    ==================================================================== TOXASSURE SELECT 13 (MW) ==================================================================== Test                             Result       Flag       Units Drug Present and Declared for Prescription Verification   Oxycodone                      2377         EXPECTED   ng/mg creat   Oxymorphone                    2225         EXPECTED   ng/mg creat   Noroxycodone                   7596         EXPECTED   ng/mg creat   Noroxymorphone                 911          EXPECTED   ng/mg creat    Sources of oxycodone are scheduled prescription medications.    Oxymorphone, noroxycodone, and noroxymorphone are expected    metabolites of oxycodone. Oxymorphone is also available as a    scheduled prescription medication. ==================================================================== Test                      Result    Flag   Units      Ref Range   Creatinine              103              mg/dL      >=20 ==================================================================== Declared Medications:  The flagging and interpretation on this report are based on the  following declared medications.  Unexpected results may arise from  inaccuracies in the  declared medications.  **Note: The testing scope of this panel includes these medications:  Oxycodone  **Note: The testing scope of this panel does not include following  reported medications:  Albuterol  Apixaban  Aspirin  Bisacodyl  Calcium citrate (Calcium citrate/Vitamin D)  Cholecalciferol  Furosemide  Levocetirizine  Lisinopril  Magnesium Oxide  Omeprazole  Oxygen  Ropinirole  Supplement  Vitamin D (Calcium citrate/Vitamin D) ==================================================================== For clinical consultation, please call [phone]. ====================================================================    UDS interpretation: Compliant          Medication Assessment Form: Reviewed. Patient indicates being compliant with therapy Treatment compliance: Compliant Risk Assessment Profile: Aberrant behavior: See prior evaluations. None observed or detected today Comorbid factors increasing risk of overdose: See prior notes. No additional risks detected today Risk of substance use disorder (SUD): Low Opioid Risk Tool (ORT) Total Score:    Interpretation Table:  Score <3 = Low Risk for SUD  Score between 4-7 = Moderate Risk for SUD  Score >8 = High Risk for Opioid Abuse   Risk Mitigation Strategies:  Patient Counseling: Covered Patient-Prescriber Agreement (PPA): Present and active  Notification to other healthcare providers: Done  Pharmacologic Plan: No change in therapy, at this time  Laboratory Chemistry  Inflammation Markers Lab Results  Component Value Date   CRP <0.5 09/24/2015   ESRSEDRATE 12 09/24/2015   (CRP: Acute Phase) (ESR: Chronic Phase) Renal Function Markers Lab Results  Component Value Date   BUN 28 (H) 08/31/2016   CREATININE 1.26 (H) 08/31/2016   GFRAA 45 (L) 08/31/2016   GFRNONAA 39 (L) 08/31/2016   Hepatic Function Markers Lab Results  Component Value Date   AST 21 03/02/2016   ALT 10 03/02/2016   ALBUMIN 3.7 03/02/2016   ALKPHOS 74 03/02/2016   Electrolytes Lab Results  Component Value Date   NA 140 08/31/2016   K 4.4 08/31/2016   CL 107 08/31/2016   CALCIUM 9.4 08/31/2016   MG 2.2 09/24/2015   Neuropathy Markers No results found for: [id number] Bone Pathology Markers Lab Results  Component Value Date   ALKPHOS 74 03/02/2016   CALCIUM 9.4 08/31/2016   Coagulation Parameters Lab Results  Component Value Date   INR 1.24 12/16/2015   LABPROT 15.8 (H) 12/16/2015   APTT 26 12/16/2015   PLT 191 03/02/2016   Cardiovascular Markers Lab Results  Component  Value Date   HGB 13.5 03/02/2016   HCT 41.6 03/02/2016   Note: Lab results reviewed.  Recent Diagnostic Imaging Review  Dg Shoulder Left  Result Date: 08/22/2016 CLINICAL DATA:  Shoulder pain after taking down Christmas lights. EXAM: LEFT SHOULDER - 2+ VIEW COMPARISON:  LEFT shoulder radiograph February 19, 2011 FINDINGS: Bony fragments project in the inferior glenohumeral joint space, no donor site. No dislocation. LEFT shoulder arthroplasty. Intact well-seated non cemented hardware without periprosthetic lucency. Cardiac pacemaker LEFT chest. Soft tissue planes are nonsuspicious. IMPRESSION: Bony fragments LEFT inferior glenohumeral joint space concerning for acute fracture, possible heterotopic ossification. Electronically Signed   By: Elon Alas M.D.   On: 08/22/2016 22:21   Note: Imaging results reviewed.          Meds  The patient has a current medication list which includes the following prescription(s): albuterol, apixaban, aspirin ec, bisacodyl, cephalexin, vitamin d, furosemide, levocetirizine, lisinopril, omeprazole, oxycodone hcl, oxycodone hcl, oxycodone hcl, oxygen-helium, ropinirole, and benefiber.  Current Outpatient Prescriptions on File Prior to Visit  Medication Sig  . albuterol (PROVENTIL) (2.5 MG/3ML) 0.083% nebulizer solution Take 3 mLs (  2.5 mg total) by nebulization every 8 (eight) hours as needed. Shortness of breath and wheezing  . apixaban (ELIQUIS) 5 MG TABS tablet Take 1 tablet (5 mg total) by mouth 2 (two) times daily.  Marland Kitchen aspirin EC 325 MG tablet Take 325 mg by mouth daily.   . cephALEXin (KEFLEX) 250 MG capsule Take 1 capsule (250 mg total) by mouth 4 (four) times daily. Take for 7 days  . Cholecalciferol (VITAMIN D) 2000 units tablet Take 2,000 Units by mouth daily.   . furosemide (LASIX) 20 MG tablet Take 1 tablet (20 mg total) by mouth daily.  Marland Kitchen levocetirizine (XYZAL) 5 MG tablet Take 1 tablet (5 mg total) by mouth every evening.  Marland Kitchen lisinopril  (PRINIVIL,ZESTRIL) 10 MG tablet TAKE 1 TABLET BY MOUTH ONCE DAILY.  Marland Kitchen OXYGEN Inhale 2 Doses into the lungs at bedtime.   No current facility-administered medications on file prior to visit.    ROS  Constitutional: Denies any fever or chills Gastrointestinal: No reported hemesis, hematochezia, vomiting, or acute GI distress Musculoskeletal: Denies any acute onset joint swelling, redness, loss of ROM, or weakness Neurological: No reported episodes of acute onset apraxia, aphasia, dysarthria, agnosia, amnesia, paralysis, loss of coordination, or loss of consciousness  Allergies  Ms. Bellotti is allergic to cyclobenzaprine; flexeril [cyclobenzaprine hcl]; and vancomycin.  PFSH  Drug: Ms. Freundlich  reports that she does not use drugs. Alcohol:  reports that she does not drink alcohol. Tobacco:  reports that she quit smoking about 17 years ago. Her smoking use included Cigarettes. She has a 50.00 pack-year smoking history. She has never used smokeless tobacco. Medical:  has a past medical history of Arthritis; Arthritis; Atrial fibrillation ([place]); Back pain; Bradycardia; Breast cancer ([place]) (2004); Cancer [facility]) (2004); CHF (congestive heart failure) ([place]); Chronic back pain; CKD (chronic kidney disease); COPD (chronic obstructive pulmonary disease) ([place]); Cystocele; Decubitus ulcers; Gout; Hematuria; Hepatitis C; Hyperlipidemia; Hypertension; Hypomagnesemia (06/25/2015); OAB (overactive bladder); Overactive bladder; Restless leg; Shoulder pain, bilateral; Urinary frequency; UTI (lower urinary tract infection); and Vaginal atrophy. Family: family history includes Breast cancer in her paternal aunt; Breast cancer (age of onset: 98) in her sister; Cancer in her sister; Heart disease in her father and mother; Kidney disease in her brother; Stroke in her mother.  Past Surgical History:  Procedure Laterality Date  . ABDOMINAL HYSTERECTOMY    . BACK SURGERY    . BREAST SURGERY     rt lumpectomy  . CARPAL  TUNNEL RELEASE    . CATARACT EXTRACTION W/PHACO  10/19/2011   Procedure: CATARACT EXTRACTION PHACO AND INTRAOCULAR LENS PLACEMENT (IOC);  Surgeon: Elta Guadeloupe T. Gershon Crane, MD;  Location: [facility];  Service: Ophthalmology;  Laterality: Right;  CDE:10.81  . CATARACT EXTRACTION W/PHACO  11/02/2011   Procedure: CATARACT EXTRACTION PHACO AND INTRAOCULAR LENS PLACEMENT (IOC);  Surgeon: Elta Guadeloupe T. Gershon Crane, MD;  Location: [facility];  Service: Ophthalmology;  Laterality: Left;  CDE 8.60  . COLON SURGERY    . JOINT REPLACEMENT     bilateral TKA  . PACEMAKER INSERTION N/A 12/24/2015   Procedure: INSERTION PACEMAKER;  Surgeon: Isaias Cowman, MD;  Location: [facility];  Service: Cardiovascular;  Laterality: N/A;  . ROTATOR CUFF REPAIR     bilateral   Constitutional Exam  General appearance: Well nourished, well developed, and well hydrated. In no apparent acute distress Vitals:   11/11/16 1251  BP: (!) 138/92  Pulse: 86  Resp: 18  Temp: 98 F (36.7 C)  SpO2: 95%  Weight: 167 lb (75.8  kg)  Height: 5' 9" (1.753 m)   BMI Assessment: Estimated body mass index is 24.66 kg/m as calculated from the following:   Height as of this encounter: 5' 9" (1.753 m).   Weight as of this encounter: 167 lb (75.8 kg).  BMI interpretation table: BMI level Category Range association with higher incidence of chronic pain  <18 kg/m2 Underweight   18.5-24.9 kg/m2 Ideal body weight   25-29.9 kg/m2 Overweight Increased incidence by 20%  30-34.9 kg/m2 Obese (Class I) Increased incidence by 68%  35-39.9 kg/m2 Severe obesity (Class II) Increased incidence by 136%  >40 kg/m2 Extreme obesity (Class III) Increased incidence by 254%   BMI Readings from Last 4 Encounters:  11/11/16 24.66 kg/m  11/08/16 24.96 kg/m  09/14/16 24.37 kg/m  07/28/16 24.81 kg/m   Wt Readings from Last 4 Encounters:  11/11/16 167 lb (75.8 kg)  11/08/16 169 lb (76.7 kg)  09/14/16 165 lb (74.8 kg)  07/28/16 168 lb (76.2 kg)  Psych/Mental status: Alert,  oriented x 3 (person, place, & time)       Eyes: PERLA Respiratory: No evidence of acute respiratory distress  Cervical Spine Exam  Inspection: No masses, redness, or swelling Alignment: Symmetrical Functional ROM: Unrestricted ROM Stability: No instability detected Muscle strength & Tone: Functionally intact Sensory: Unimpaired Palpation: No palpable anomalies  Upper Extremity (UE) Exam    Side: Right upper extremity  Side: Left upper extremity  Inspection: No masses, redness, swelling, or asymmetry. No contractures  Inspection: No masses, redness, swelling, or asymmetry. No contractures  Functional ROM: Unrestricted ROM          Functional ROM: Unrestricted ROM          Muscle strength & Tone: Functionally intact  Muscle strength & Tone: Functionally intact  Sensory: Unimpaired  Sensory: Unimpaired  Palpation: No palpable anomalies  Palpation: No palpable anomalies  Specialized Test(s): Deferred         Specialized Test(s): Deferred          Thoracic Spine Exam  Inspection: No masses, redness, or swelling Alignment: Symmetrical Functional ROM: Unrestricted ROM Stability: No instability detected Sensory: Unimpaired Muscle strength & Tone: No palpable anomalies  Lumbar Spine Exam  Inspection: No masses, redness, or swelling Alignment: Symmetrical Functional ROM: Limited ROM Stability: No instability detected Muscle strength & Tone: Functionally intact Sensory: Movement-associated pain Palpation: Tender Provocative Tests: Lumbar Hyperextension and rotation test: Positive bilaterally for facet joint pain. Patrick's Maneuver: evaluation deferred today              Gait & Posture Assessment  Ambulation: Unassisted Gait: Relatively normal for age and body habitus Posture: WNL   Lower Extremity Exam    Side: Right lower extremity  Side: Left lower extremity  Inspection: No masses, redness, swelling, or asymmetry. No contractures  Inspection: No masses, redness, swelling, or  asymmetry. No contractures  Functional ROM: Unrestricted ROM          Functional ROM: Unrestricted ROM          Muscle strength & Tone: Functionally intact  Muscle strength & Tone: Functionally intact  Sensory: Unimpaired  Sensory: Unimpaired  Palpation: No palpable anomalies  Palpation: No palpable anomalies   Assessment  Primary Diagnosis & Pertinent Problem List: The primary encounter diagnosis was Chronic pain syndrome. Diagnoses of Chronic low back pain (Location of Primary Source of Pain) (Bilateral) (L>R), Lumbar facet syndrome (Bilateral) (L>R), Lumbar facet hypertrophy (L1-2, L2-3, and L4-5), Lumbar spondylosis, Failed back surgical syndrome (L4-5 left  hemilaminectomy laminectomy), Long term prescription opiate use, Opiate use (90 MME/Day), Restless leg syndrome, and Opioid-induced constipation (OIC) were also pertinent to this visit.  Status Diagnosis  Controlled Controlled Controlled 1. Chronic pain syndrome   2. Chronic low back pain (Location of Primary Source of Pain) (Bilateral) (L>R)   3. Lumbar facet syndrome (Bilateral) (L>R)   4. Lumbar facet hypertrophy (L1-2, L2-3, and L4-5)   5. Lumbar spondylosis   6. 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 Long term prescription opiate use   8. Opiate use (90 MME/Day)   9. Restless leg syndrome   10. Opioid-induced constipation (OIC)      Plan of Care  Pharmacotherapy (Medications Ordered): Meds ordered this encounter  Medications  . Oxycodone HCl 20 MG TABS    Sig: Take 1 tablet (20 mg total) by mouth every 8 (eight) hours as needed.    Dispense:  90 tablet    Refill:  0    Do not add this medication to the electronic "Automatic Refill" notification system. Patient may have prescription filled one day early if pharmacy is closed on scheduled refill date. Do not fill until: 02/11/17 To last until: 03/13/17  . Oxycodone HCl 20 MG TABS    Sig: Take 1 tablet (20 mg total) by mouth every 8  (eight) hours as needed.    Dispense:  90 tablet    Refill:  0    Do not add this medication to the electronic "Automatic Refill" notification system. Patient may have prescription filled one day early if pharmacy is closed on scheduled refill date. Do not fill until: 12/13/16 To last until: 01/12/17  . Oxycodone HCl 20 MG TABS    Sig: Take 1 tablet (20 mg total) by mouth every 8 (eight) hours as needed.    Dispense:  90 tablet    Refill:  0    Do not add this medication to the electronic "Automatic Refill" notification system. Patient may have prescription filled one day early if pharmacy is closed on scheduled refill date. Do not fill until: 01/12/17 To last until: 02/11/17  . rOPINIRole (REQUIP) 2 MG tablet    Sig: Take 1 tablet (2 mg total) by mouth at bedtime.    Dispense:  90 tablet    Refill:  0    Do not place this medication, or any other prescription from our practice, on "Automatic Refill". Patient may have prescription filled one day early if pharmacy is closed on scheduled refill date.  . bisacodyl (DULCOLAX) 5 MG EC tablet    Sig: Take 2 tablets (10 mg total) by mouth daily as needed for moderate constipation.    Dispense:  60 tablet    Refill:  2    Do not place this medication, or any other prescription from our practice, on "Automatic Refill". Patient may have prescription filled one day early if pharmacy is closed on scheduled refill date.  . Wheat Dextrin (BENEFIBER) POWD    Sig: Stir 2 tsp. TID into 4-8 oz of any non-carbonated beverage or soft food (hot or cold)    Dispense:  500 g    Refill:  0    This is an OTC product. This prescription is to serve as a reminder to the patient as to our preference.   New Prescriptions   WHEAT DEXTRIN (BENEFIBER) POWD    Stir 2 tsp. TID into 4-8 oz of any non-carbonated beverage or soft food (hot or cold)  Medications administered today: Ms. Sobocinski had no medications administered during this visit. Lab-work, procedure(s),  and/or referral(s): No orders of the defined types were placed in this encounter.  Imaging and/or referral(s): None  Interventional therapies: Planned, scheduled, and/or pending:   Left Lumbar Facet RFA.   Considering:   Possible bilateral lumbar facet radiofrequency. Palliative bilateral lumbar facet block under fluoroscopic guidance and IV sedation Diagnostic right intra-articular shoulder joint injection under fluoroscopic guidance, no sedation.  Diagnostic right suprascapular nerve block under fluoroscopic guidance, with or without sedation.  Possible right suprascapular radiofrequency ablation.    Palliative PRN treatment(s):   Palliative bilateral lumbar facet block under fluoroscopic guidance and IV sedation Diagnostic right intra-articular shoulder joint injection under fluoroscopic guidance, no sedation.  Diagnostic right suprascapular nerve block under fluoroscopic guidance, with or without sedation.  Possible right suprascapular radiofrequency ablation.    Provider-requested follow-up: Return in 4 months (on 03/03/2017) for keep scheduled Med-Mgmt appointment, (Nurse Practitioner) Med-Mgmt.  Future Appointments Date Time Provider [place]  12/13/2016 10:45 AM Milinda Pointer, MD [facility] None  03/01/2017 11:00 AM Vevelyn Francois, NP [facility] None  03/17/2017 11:00 AM Ander Purpura Bonnye Fava, NP [facility] None   Primary Care Physician: Mikey College, NP Location: [facility] Outpatient Pain Management Facility Note by: Kathlen Brunswick. Dossie Arbour, M.D, DABA, DABAPM, DABPM, DABIPP, FIPP Date: 11/11/2016; Time: 3:03 PM  Pain Score Disclaimer: We use the NRS-11 scale. This is a self-reported, subjective measurement of pain severity with only modest accuracy. It is used primarily to identify changes within a particular patient. It must be understood that outpatient pain scales are significantly less accurate that those used for research, where they can be applied under ideal  controlled circumstances with minimal exposure to variables. In reality, the score is likely to be a combination of pain intensity and pain affect, where pain affect describes the degree of emotional arousal or changes in action readiness caused by the sensory experience of pain. Factors such as social and work situation, setting, emotional state, anxiety levels, expectation, and prior pain experience may influence pain perception and show large inter-individual differences that may also be affected by time variables.  Patient instructions provided during this appointment: Patient Instructions  Stop Eliquis for 3 days, but take Aspirin 81 mg instead.  Do not eat or drink for 8 hours, bring a driver, take blood pressure medicine.    Radiofrequency Lesioning Radiofrequency lesioning is a procedure that is performed to relieve pain. The procedure is often used for back, neck, or arm pain. Radiofrequency lesioning involves the use of a machine that creates radio waves to make heat. During the procedure, the heat is applied to the nerve that carries the pain signal. The heat damages the nerve and interferes with the pain signal. Pain relief usually starts about 2 weeks after the procedure and lasts for 6 months to 1 year. Tell a health care provider about:  Any allergies you have.  All medicines you are taking, including vitamins, herbs, eye drops, creams, and over-the-counter medicines.  Any problems you or family members have had with anesthetic medicines.  Any blood disorders you have.  Any surgeries you have had.  Any medical conditions you have.  Whether you are pregnant or may be pregnant. What are the risks? Generally, this is a safe procedure. However, problems may occur, including:  Pain or soreness at the injection site.  Infection at the injection site.  Damage to nerves or blood vessels. What happens before the procedure?  Ask  your health care provider about:  Changing or  stopping your regular medicines. This is especially important if you are taking diabetes medicines or blood thinners.  Taking medicines such as aspirin and ibuprofen. These medicines can thin your blood. Do not take these medicines before your procedure if your health care provider instructs you not to.  Follow instructions from your health care provider about eating or drinking restrictions.  Plan to have someone take you home after the procedure.  If you go home right after the procedure, plan to have someone with you for 24 hours. What happens during the procedure?  You will be given one or more of the following:  A medicine to help you relax (sedative).  A medicine to numb the area (local anesthetic).  You will be awake during the procedure. You will need to be able to talk with the health care provider during the procedure.  With the help of a type of X-ray (fluoroscopy), the health care provider will insert a radiofrequency needle into the area to be treated.  Next, a wire that carries the radio waves (electrode) will be put through the radiofrequency needle. An electrical pulse will be sent through the electrode to verify the correct nerve. You will feel a tingling sensation, and you may have muscle twitching.  Then, the tissue that is around the needle tip will be heated by an electric current that is passed using the radiofrequency machine. This will numb the nerves.  A bandage (dressing) will be put on the insertion area after the procedure is done. The procedure may vary among health care providers and hospitals. What happens after the procedure?  Your blood pressure, heart rate, breathing rate, and blood oxygen level will be monitored often until the medicines you were given have worn off.  Return to your normal activities as directed by your health care provider. This information is not intended to replace advice given to you by your health care provider. Make sure you  discuss any questions you have with your health care provider. Document Released: 04/07/2011 Document Revised: 01/15/2016 Document Reviewed: 09/16/2014 Elsevier Interactive Patient Education  2017 [employer].

## 2016-11-16 DIAGNOSIS — I2510 Atherosclerotic heart disease of native coronary artery without angina pectoris: Secondary | ICD-10-CM | POA: Diagnosis not present

## 2016-11-16 DIAGNOSIS — K810 Acute cholecystitis: Secondary | ICD-10-CM | POA: Diagnosis not present

## 2016-11-16 DIAGNOSIS — R740 Nonspecific elevation of levels of transaminase and lactic acid dehydrogenase [LDH]: Secondary | ICD-10-CM | POA: Diagnosis not present

## 2016-11-16 DIAGNOSIS — K811 Chronic cholecystitis: Secondary | ICD-10-CM | POA: Diagnosis not present

## 2016-11-16 DIAGNOSIS — N183 Chronic kidney disease, stage 3 (moderate): Secondary | ICD-10-CM | POA: Diagnosis not present

## 2016-11-16 DIAGNOSIS — I5023 Acute on chronic systolic (congestive) heart failure: Secondary | ICD-10-CM | POA: Diagnosis not present

## 2016-11-16 DIAGNOSIS — Z950 Presence of cardiac pacemaker: Secondary | ICD-10-CM | POA: Diagnosis not present

## 2016-11-16 DIAGNOSIS — I10 Essential (primary) hypertension: Secondary | ICD-10-CM | POA: Diagnosis not present

## 2016-11-16 DIAGNOSIS — M791 Myalgia: Secondary | ICD-10-CM | POA: Diagnosis not present

## 2016-11-16 DIAGNOSIS — B9620 Unspecified Escherichia coli [E. coli] as the cause of diseases classified elsewhere: Secondary | ICD-10-CM | POA: Diagnosis not present

## 2016-11-16 DIAGNOSIS — R932 Abnormal findings on diagnostic imaging of liver and biliary tract: Secondary | ICD-10-CM | POA: Diagnosis not present

## 2016-11-16 DIAGNOSIS — J449 Chronic obstructive pulmonary disease, unspecified: Secondary | ICD-10-CM | POA: Diagnosis not present

## 2016-11-16 DIAGNOSIS — G2581 Restless legs syndrome: Secondary | ICD-10-CM | POA: Diagnosis not present

## 2016-11-16 DIAGNOSIS — Z853 Personal history of malignant neoplasm of breast: Secondary | ICD-10-CM | POA: Diagnosis not present

## 2016-11-16 DIAGNOSIS — K8040 Calculus of bile duct with cholecystitis, unspecified, without obstruction: Secondary | ICD-10-CM | POA: Diagnosis not present

## 2016-11-16 DIAGNOSIS — R768 Other specified abnormal immunological findings in serum: Secondary | ICD-10-CM | POA: Diagnosis not present

## 2016-11-16 DIAGNOSIS — R918 Other nonspecific abnormal finding of lung field: Secondary | ICD-10-CM | POA: Diagnosis not present

## 2016-11-16 DIAGNOSIS — I5042 Chronic combined systolic (congestive) and diastolic (congestive) heart failure: Secondary | ICD-10-CM | POA: Diagnosis not present

## 2016-11-16 DIAGNOSIS — Z7901 Long term (current) use of anticoagulants: Secondary | ICD-10-CM | POA: Diagnosis not present

## 2016-11-16 DIAGNOSIS — Z96612 Presence of left artificial shoulder joint: Secondary | ICD-10-CM | POA: Diagnosis not present

## 2016-11-16 DIAGNOSIS — R945 Abnormal results of liver function studies: Secondary | ICD-10-CM | POA: Diagnosis not present

## 2016-11-16 DIAGNOSIS — K7689 Other specified diseases of liver: Secondary | ICD-10-CM | POA: Diagnosis not present

## 2016-11-16 DIAGNOSIS — K828 Other specified diseases of gallbladder: Secondary | ICD-10-CM | POA: Diagnosis not present

## 2016-11-16 DIAGNOSIS — I130 Hypertensive heart and chronic kidney disease with heart failure and stage 1 through stage 4 chronic kidney disease, or unspecified chronic kidney disease: Secondary | ICD-10-CM | POA: Diagnosis not present

## 2016-11-16 DIAGNOSIS — E860 Dehydration: Secondary | ICD-10-CM | POA: Diagnosis not present

## 2016-11-16 DIAGNOSIS — R7881 Bacteremia: Secondary | ICD-10-CM | POA: Diagnosis not present

## 2016-11-16 DIAGNOSIS — K8050 Calculus of bile duct without cholangitis or cholecystitis without obstruction: Secondary | ICD-10-CM | POA: Diagnosis not present

## 2016-11-16 DIAGNOSIS — Z87891 Personal history of nicotine dependence: Secondary | ICD-10-CM | POA: Diagnosis not present

## 2016-11-16 DIAGNOSIS — Z96653 Presence of artificial knee joint, bilateral: Secondary | ICD-10-CM | POA: Diagnosis not present

## 2016-11-16 DIAGNOSIS — I4891 Unspecified atrial fibrillation: Secondary | ICD-10-CM | POA: Diagnosis not present

## 2016-11-16 DIAGNOSIS — R531 Weakness: Secondary | ICD-10-CM | POA: Diagnosis not present

## 2016-11-16 DIAGNOSIS — K660 Peritoneal adhesions (postprocedural) (postinfection): Secondary | ICD-10-CM | POA: Diagnosis not present

## 2016-11-16 DIAGNOSIS — G9341 Metabolic encephalopathy: Secondary | ICD-10-CM | POA: Diagnosis not present

## 2016-11-16 DIAGNOSIS — R935 Abnormal findings on diagnostic imaging of other abdominal regions, including retroperitoneum: Secondary | ICD-10-CM | POA: Diagnosis not present

## 2016-11-16 DIAGNOSIS — I4892 Unspecified atrial flutter: Secondary | ICD-10-CM | POA: Diagnosis not present

## 2016-11-16 DIAGNOSIS — R5381 Other malaise: Secondary | ICD-10-CM | POA: Diagnosis not present

## 2016-11-16 DIAGNOSIS — J410 Simple chronic bronchitis: Secondary | ICD-10-CM | POA: Diagnosis not present

## 2016-11-16 DIAGNOSIS — E785 Hyperlipidemia, unspecified: Secondary | ICD-10-CM | POA: Diagnosis not present

## 2016-11-16 DIAGNOSIS — R748 Abnormal levels of other serum enzymes: Secondary | ICD-10-CM | POA: Diagnosis not present

## 2016-11-16 DIAGNOSIS — K8020 Calculus of gallbladder without cholecystitis without obstruction: Secondary | ICD-10-CM | POA: Diagnosis not present

## 2016-11-16 DIAGNOSIS — M1990 Unspecified osteoarthritis, unspecified site: Secondary | ICD-10-CM | POA: Diagnosis not present

## 2016-11-16 DIAGNOSIS — E1122 Type 2 diabetes mellitus with diabetic chronic kidney disease: Secondary | ICD-10-CM | POA: Diagnosis not present

## 2016-11-16 DIAGNOSIS — K838 Other specified diseases of biliary tract: Secondary | ICD-10-CM | POA: Diagnosis not present

## 2016-11-16 DIAGNOSIS — J90 Pleural effusion, not elsewhere classified: Secondary | ICD-10-CM | POA: Diagnosis not present

## 2016-11-16 DIAGNOSIS — R948 Abnormal results of function studies of other organs and systems: Secondary | ICD-10-CM | POA: Diagnosis not present

## 2016-11-16 DIAGNOSIS — N179 Acute kidney failure, unspecified: Secondary | ICD-10-CM | POA: Diagnosis not present

## 2016-11-16 DIAGNOSIS — K8042 Calculus of bile duct with acute cholecystitis without obstruction: Secondary | ICD-10-CM | POA: Diagnosis not present

## 2016-11-17 DIAGNOSIS — Z7901 Long term (current) use of anticoagulants: Secondary | ICD-10-CM | POA: Insufficient documentation

## 2016-11-17 DIAGNOSIS — I4891 Unspecified atrial fibrillation: Secondary | ICD-10-CM | POA: Insufficient documentation

## 2016-11-17 DIAGNOSIS — I4892 Unspecified atrial flutter: Secondary | ICD-10-CM

## 2016-11-17 DIAGNOSIS — R768 Other specified abnormal immunological findings in serum: Secondary | ICD-10-CM | POA: Diagnosis not present

## 2016-11-18 DIAGNOSIS — R768 Other specified abnormal immunological findings in serum: Secondary | ICD-10-CM | POA: Insufficient documentation

## 2016-11-20 DIAGNOSIS — K810 Acute cholecystitis: Secondary | ICD-10-CM | POA: Diagnosis not present

## 2016-11-24 DIAGNOSIS — I10 Essential (primary) hypertension: Secondary | ICD-10-CM | POA: Diagnosis not present

## 2016-11-24 DIAGNOSIS — R531 Weakness: Secondary | ICD-10-CM | POA: Diagnosis not present

## 2016-11-24 DIAGNOSIS — I4891 Unspecified atrial fibrillation: Secondary | ICD-10-CM | POA: Diagnosis not present

## 2016-11-24 DIAGNOSIS — Z8679 Personal history of other diseases of the circulatory system: Secondary | ICD-10-CM | POA: Diagnosis not present

## 2016-11-24 DIAGNOSIS — J449 Chronic obstructive pulmonary disease, unspecified: Secondary | ICD-10-CM | POA: Diagnosis not present

## 2016-11-25 DIAGNOSIS — R531 Weakness: Secondary | ICD-10-CM | POA: Diagnosis not present

## 2016-11-25 DIAGNOSIS — I10 Essential (primary) hypertension: Secondary | ICD-10-CM | POA: Diagnosis not present

## 2016-11-25 DIAGNOSIS — Z8679 Personal history of other diseases of the circulatory system: Secondary | ICD-10-CM | POA: Diagnosis not present

## 2016-11-25 DIAGNOSIS — J449 Chronic obstructive pulmonary disease, unspecified: Secondary | ICD-10-CM | POA: Diagnosis not present

## 2016-11-25 DIAGNOSIS — I4891 Unspecified atrial fibrillation: Secondary | ICD-10-CM | POA: Diagnosis not present

## 2016-11-26 ENCOUNTER — Encounter: Payer: Self-pay | Admitting: Nurse Practitioner

## 2016-11-26 ENCOUNTER — Other Ambulatory Visit: Payer: Self-pay | Admitting: *Deleted

## 2016-11-26 ENCOUNTER — Ambulatory Visit (INDEPENDENT_AMBULATORY_CARE_PROVIDER_SITE_OTHER): Payer: Medicare Other | Admitting: Nurse Practitioner

## 2016-11-26 ENCOUNTER — Encounter: Payer: Self-pay | Admitting: *Deleted

## 2016-11-26 VITALS — BP 111/70 | HR 76 | Temp 98.0°F | Resp 16 | Ht 69.0 in | Wt 183.0 lb

## 2016-11-26 DIAGNOSIS — Z8679 Personal history of other diseases of the circulatory system: Secondary | ICD-10-CM | POA: Diagnosis not present

## 2016-11-26 DIAGNOSIS — R600 Localized edema: Secondary | ICD-10-CM | POA: Diagnosis not present

## 2016-11-26 DIAGNOSIS — Z09 Encounter for follow-up examination after completed treatment for conditions other than malignant neoplasm: Secondary | ICD-10-CM | POA: Diagnosis not present

## 2016-11-26 DIAGNOSIS — Z9049 Acquired absence of other specified parts of digestive tract: Secondary | ICD-10-CM | POA: Diagnosis not present

## 2016-11-26 DIAGNOSIS — K810 Acute cholecystitis: Secondary | ICD-10-CM | POA: Diagnosis not present

## 2016-11-26 DIAGNOSIS — I10 Essential (primary) hypertension: Secondary | ICD-10-CM | POA: Diagnosis not present

## 2016-11-26 DIAGNOSIS — R531 Weakness: Secondary | ICD-10-CM | POA: Diagnosis not present

## 2016-11-26 DIAGNOSIS — J449 Chronic obstructive pulmonary disease, unspecified: Secondary | ICD-10-CM | POA: Diagnosis not present

## 2016-11-26 DIAGNOSIS — I4891 Unspecified atrial fibrillation: Secondary | ICD-10-CM | POA: Diagnosis not present

## 2016-11-26 NOTE — Progress Notes (Signed)
Subjective:    [name], female    DOB: 1930/04/19, 83 y.o.   MRN: [id number]  Kristin [name] is a 83 y.o. female presenting on 11/26/2016 for Hospitalization Follow-up  HPI  Hospital follow up s/p laparoscopic cholecystectomy on 11/20/2016 Ms Branagan was admitted on 11/16/2016 to [facility] with acute cholecystitis complicated by choledocholithiasis and E.coli bacteremia.  At discharge her home lasix and lisinopril were held due to mild AKI and hypotension during hospitalization.  She was discharged home on 11/22/2016.  She finished her Augmentin prescription yesterday. She denies fever, chills, sweats, nausea, vomiting, constipation, and diarrhea with the exception of fever (100.1) and chills on Wednesday 11/24/2016.    She states her incisions "look pretty good," but endorses soreness at the incision in the epigastric region.  Pain is controlled with oxycodone 20 mg bid (her chronic dose for back pain) without need for break through pain control. She denies incisional drainage, redness, and warmth.  She has had daily bowel movements since going home and is taking miralax daily for 14 days and senna 2 tabs 2x per day for 30 days.  Prior to surgery she was taking benefiber and dulcolax prn (about every 2 days).  Mobility: She is using a walker today and reports prior to surgery she used a cane sometimes.  She still feels weak, but is getting stronger daily.  She is receiving home PT 2x week for 3 weeks and home OT 1x week for at least one more visit.  She was unsure of how long they were planning to come.  BP / BLE edema Discharge VS on 11/22/2016: BP 107/61  Pulse 81   Pre-op wt 3/28: 170 lbs (no post-op weight documented)  She has not checked her BP since going home, but denies dizziness and lightheadedness.  She does report her feet are very swollen and hurt when she walks.  She is wearing her only pair of shoes that fit.  She rests in a recliner at home with her feet  elevated.  Prior to hospitalization weight 11/11/2016 = 167 lbs 11/25/2016: 182 lbs 11/26/2016: 183 lbs  Social History  Substance Use Topics  . Smoking status: Former Smoker    Packs/day: 1.25    Years: 40.00    Types: Cigarettes    Quit date: 12/16/1998  . Smokeless tobacco: Never Used  . Alcohol use No    Review of Systems Per HPI unless specifically indicated above     Objective:    BP 111/70 (BP Location: Left Arm, Patient Position: Sitting, Cuff Size: Normal)   Pulse 76   Temp 98 F (36.7 C) (Oral)   Resp 16   Ht '5\' 9"'$  (1.753 m)   Wt 183 lb (83 kg)   SpO2 97%   BMI 27.02 kg/m   Wt Readings from Last 3 Encounters:  11/26/16 183 lb (83 kg)  11/11/16 167 lb (75.8 kg)  11/08/16 169 lb (76.7 kg)    Physical Exam  Constitutional: She is oriented to person, place, and time. She appears well-developed and well-nourished.  HENT:  Head: Normocephalic and atraumatic.  Cardiovascular: Normal rate, regular rhythm and intact distal pulses.   Murmur heard. BLE +3 pitting edema  Pulmonary/Chest: Effort normal and breath sounds normal. No respiratory distress.  Abdominal: Soft. Bowel sounds are normal. She exhibits no distension.    Neurological: She is alert and oriented to person, place, and time.  Skin: Skin is warm and dry.  Psychiatric: She  has a normal mood and affect. Her behavior is normal. Judgment and thought content normal.      Assessment & Plan:   Problem List Items Addressed This Visit    None    Visit Diagnoses    Hospital discharge follow-up    -  Primary 1. Continue your home PT and OT.  Discussed with patient to avoid driving until PT, OT, or her surgeon clears her to drive even though she wishes for independence. 2. Keep your follow up appointment with your surgeon.     Status post laparoscopic cholecystectomy     1. Incisions healing well.  One painful incision without other complications. 2. No evidence of infection at incisions or constitutional  ROS. 3. Physically deconditioned. Continue therapy and using assistive devices as needed.  Encouraged physical activity as tolerated.    Hypertension 1. Personal history of hypertension.  Normotensive today. 2. Restart lisinopril on Saturday 4/6.  Check BP before taking. If BP less than 100/60, hold medication. 3. Requested assistance from [facility] care management for a home BP evaluation in about 2 weeks. Care management RN Sherrin Daisy was assigned and will call PCP if needed.  Bilateral lower extremity edema     1. Take one tablet lasix 20 mg today. 2. Continue to hold lasix. 3.  Instructions given to call office if gains 2 lbs in 1 day or 5 lbs in 7 days.   4. Consider referral to cardiology if no improvement in BLE or BP.       Meds ordered this encounter  Medications  . ondansetron (ZOFRAN-ODT) 4 MG disintegrating tablet    Sig: Take 4 mg by mouth as needed.  . polyethylene glycol (MIRALAX / GLYCOLAX) packet    Sig: Take 17 g by mouth daily as needed.  . senna-docusate (SENOKOT-S) 8.6-50 MG tablet    Sig: Take 2 tablets by mouth 2 (two) times daily.    Follow up plan: Return in about 2 months (around 01/26/2017) for BP monitoring.  [name], DNP, AGPCNP-BC Adult Gerontology Primary Care Nurse Practitioner [facility] 11/26/2016, 5:21 PM

## 2016-11-26 NOTE — Patient Instructions (Addendum)
[name], Thank you for coming in to clinic today.  1. Restart your lisinopril on Saturday 11/27/2016.  Check your BP daily before your take your medicine and keep a log.  Do not take your lisinopril if your BP is less than 100/60.      2. Take one lasix pill today.  Do not restart your lasix every day.  If you notice an increase in 2 lbs in 1 day or 5 lbs in 1 week. Call me and we will tell you if you need to take your lasix.   If your BP regularly is around 120/80, you can start your lasix every day.  3. Continue cleaning your incisions with your hospital instruction.  If they start draining, are red or hot, call the clinic.  4.  If you have a fever, chills, or sweats check your temperature and call the clinic.  You might have an infection.  You have taken antibiotics and they should prevent infection.  Please schedule a follow-up appointment with Kristin [name], AGNP in  2 months.  If you have any other questions or concerns, please feel free to call the clinic or send a message through [place]. You may also schedule an earlier appointment if necessary.  Kristin Smiles, DNP, AGNP-BC Adult Gerontology Nurse Practitioner [place]   How to Take Your Blood Pressure Blood pressure is a measurement of how strongly your blood is pressing against the walls of your arteries. Arteries are blood vessels that carry blood from your heart throughout your body. Your health care provider takes your blood pressure at each office visit. You can also take your own blood pressure at home with a blood pressure machine. You may need to take your own blood pressure:  To confirm a diagnosis of high blood pressure (hypertension).  To monitor your blood pressure over time.  To make sure your blood pressure medicine is working. Supplies needed: To take your blood pressure, you will need a blood pressure machine. You can buy a blood pressure machine, or blood pressure monitor, at most  drugstores or online. There are several types of home blood pressure monitors. When choosing one, consider the following:  Choose a monitor that has an arm cuff.  Choose a monitor that wraps snugly around your upper arm. You should be able to fit only one finger between your arm and the cuff.  Do not choose a monitor that measures your blood pressure from your wrist or finger. Your health care provider can suggest a reliable monitor that will meet your needs. How to prepare To get the most accurate reading, avoid the following for 30 minutes before you check your blood pressure:  Drinking caffeine.  Drinking alcohol.  Eating.  Smoking.  Exercising. Five minutes before you check your blood pressure:  Empty your bladder.  Sit quietly without talking in a dining chair, rather than in a soft couch or armchair. How to take your blood pressure To check your blood pressure, follow the instructions in the manual that came with your blood pressure monitor. If you have a digital blood pressure monitor, the instructions may be as follows: 1. Sit up straight. 2. Place your feet on the floor. Do not cross your ankles or legs. 3. Rest your left arm at the level of your heart on a table or desk or on the arm of a chair. 4. Pull up your shirt sleeve. 5. Wrap the blood pressure cuff around the upper part of your  left arm, 1 inch (2.5 cm) above your elbow. It is best to wrap the cuff around bare skin. 6. Fit the cuff snugly around your arm. You should be able to place only one finger between the cuff and your arm. 7. Position the cord inside the groove of your elbow. 8. Press the power button. 9. Sit quietly while the cuff inflates and deflates. 10. Read the digital reading on the monitor screen and write it down (record it). 11. Wait 2-3 minutes, then repeat the steps, starting at step 1. What does my blood pressure reading mean? A blood pressure reading consists of a higher number over a lower  number. Ideally, your blood pressure should be below 120/80. The first ("top") number is called the systolic pressure. It is a measure of the pressure in your arteries as your heart beats. The second ("bottom") number is called the diastolic pressure. It is a measure of the pressure in your arteries as the heart relaxes. Blood pressure is classified into four stages. The following are the stages for adults who do not have a short-term serious illness or a chronic condition. Systolic pressure and diastolic pressure are measured in a unit called mm Hg. Normal   Systolic pressure: below 093.  Diastolic pressure: below 80. Elevated   Systolic pressure: 112-162.  Diastolic pressure: below 80. Hypertension stage 1   Systolic pressure: 446-950.  Diastolic pressure: 72-25. Hypertension stage 2   Systolic pressure: 750 or above.  Diastolic pressure: 90 or above. You can have prehypertension or hypertension even if only the systolic or only the diastolic number in your reading is higher than normal. Follow these instructions at home:  Check your blood pressure as often as recommended by your health care provider.  Take your monitor to the next appointment with your health care provider to make sure:  That you are using it correctly.  That it provides accurate readings.  Be sure you understand what your goal blood pressure numbers are.  Tell your health care provider if you are having any side effects from blood pressure medicine. Contact a health care provider if:  Your blood pressure is consistently high. Get help right away if:  Your systolic blood pressure is higher than 180.  Your diastolic blood pressure is higher than 110. This information is not intended to replace advice given to you by your health care provider. Make sure you discuss any questions you have with your health care provider. Document Released: 01/16/2016 Document Revised: 03/30/2016 Document Reviewed:  01/16/2016 Elsevier Interactive Patient Education  2017 [employer].

## 2016-11-27 NOTE — Progress Notes (Signed)
I have reviewed this encounter including the documentation in this note and/or discussed this patient with the provider, [name], AGPCNP-BC. I am certifying that I agree with the content of this note as supervising physician.  [name], [facility] 11/27/2016, 9:09 AM

## 2016-11-29 ENCOUNTER — Encounter: Payer: Self-pay | Admitting: Nurse Practitioner

## 2016-11-29 ENCOUNTER — Other Ambulatory Visit: Payer: Self-pay | Admitting: *Deleted

## 2016-11-29 NOTE — Patient Outreach (Signed)
[facility]) Care Management  11/29/2016  MAUREENA DABBS 04-Apr-1930 [id number]  MD referral -recent hospital discharge and has hypotension:  Telephone call to patient-[phone]; recording states voice mail has not been set up. Alternate number called -grand daughter answered & advised that patient was not available; advised that she would have patient return call-contact information given.  Plan: Will follow up.  Sherrin Daisy, RN BSN [place] Management Coordinator [facility] Care Management  [phone]

## 2016-11-29 NOTE — Progress Notes (Signed)
Patient called.  BP readings have been consistently between 100-120/80 on lisinopril. Instructions given to restart lasix 20 mg once daily if BP is 120/80.

## 2016-11-30 ENCOUNTER — Other Ambulatory Visit: Payer: Self-pay | Admitting: *Deleted

## 2016-11-30 DIAGNOSIS — J449 Chronic obstructive pulmonary disease, unspecified: Secondary | ICD-10-CM | POA: Diagnosis not present

## 2016-11-30 DIAGNOSIS — Z8679 Personal history of other diseases of the circulatory system: Secondary | ICD-10-CM | POA: Diagnosis not present

## 2016-11-30 DIAGNOSIS — I4891 Unspecified atrial fibrillation: Secondary | ICD-10-CM | POA: Diagnosis not present

## 2016-11-30 DIAGNOSIS — I10 Essential (primary) hypertension: Secondary | ICD-10-CM | POA: Diagnosis not present

## 2016-11-30 DIAGNOSIS — R531 Weakness: Secondary | ICD-10-CM | POA: Diagnosis not present

## 2016-11-30 NOTE — Patient Outreach (Signed)
[facility]) Care Management  11/30/2016  Kristin [name] November 06, 1929 [id number]  Referral via MD office-recent hospital discharge 11/22/16 & has hypotension-needs to know how to manage medication based on blood pressure:  Per chart review patient was admitted 11/16/16 to [facility] in [place] with acute kidney injury, acute cholecystitis, common bile choledocholithiasis and gram negative bacteremia. Patient had ERCP for stone removal, biliary sphincterotomy then laparoscopic cholecystectomy. Other health issues include HTN, Heart Failure, pacemaker in place, COPD.  #2 Telephone call attempt to patient. Patient was advised of reason for call & [facility] care management services. HIPPA verification received from patient.  Patient states caregivers include grand daughter & son. States grand daughter manages her medications and takes her blood pressure twice daily and helps her with personal care. . States son & grand daughter assist with meals & take her to MD appointments,   Voices she has home services 2-3 times weekly for therapy sessions. States she is using walker which helps her get around easier.  Patient voices that she is not weighing daily. States BP ranges from 97/55 to 122/68 and that grand daughter is recording them & giving her medications as instructed by MD,  States  that she does not feel dizzy or faint  when her blood pressure is low.    Voices she went to hospital follow up appointment with primary care provider 11/26/2016 & also has appointment scheduled to see surgeon.   Patient's major concern is swelling in ankles & feet,  possibility of constipation with taking pain medicine for surgical pain & chronic back pain and when to take medicine based on blood pressure readings (lasix & lisinopril).  States blood pressures yesterday were 122/68 & 119/75.   Patient consents to [facility] care management services.   Plan: Advise care management assistant to refer to [facility] care  coordinator for complex case management -recent hospital discharge, post surgery, hx heart failure-pacemaker; having hypotension that requires managing medication; patient is falls risk.   Sherrin Daisy, RN BSN [place] Management Coordinator [facility] Care Management  [phone]

## 2016-12-02 DIAGNOSIS — Z8679 Personal history of other diseases of the circulatory system: Secondary | ICD-10-CM | POA: Diagnosis not present

## 2016-12-02 DIAGNOSIS — I4891 Unspecified atrial fibrillation: Secondary | ICD-10-CM | POA: Diagnosis not present

## 2016-12-02 DIAGNOSIS — R531 Weakness: Secondary | ICD-10-CM | POA: Diagnosis not present

## 2016-12-02 DIAGNOSIS — I10 Essential (primary) hypertension: Secondary | ICD-10-CM | POA: Diagnosis not present

## 2016-12-02 DIAGNOSIS — J449 Chronic obstructive pulmonary disease, unspecified: Secondary | ICD-10-CM | POA: Diagnosis not present

## 2016-12-03 ENCOUNTER — Telehealth: Payer: Self-pay | Admitting: *Deleted

## 2016-12-03 DIAGNOSIS — J449 Chronic obstructive pulmonary disease, unspecified: Secondary | ICD-10-CM | POA: Diagnosis not present

## 2016-12-03 DIAGNOSIS — Z8679 Personal history of other diseases of the circulatory system: Secondary | ICD-10-CM | POA: Diagnosis not present

## 2016-12-03 DIAGNOSIS — I10 Essential (primary) hypertension: Secondary | ICD-10-CM | POA: Diagnosis not present

## 2016-12-03 DIAGNOSIS — R531 Weakness: Secondary | ICD-10-CM | POA: Diagnosis not present

## 2016-12-03 DIAGNOSIS — I4891 Unspecified atrial fibrillation: Secondary | ICD-10-CM | POA: Diagnosis not present

## 2016-12-03 NOTE — Patient Outreach (Signed)
[facility] [facility]) Care Management  12/03/2016  Kristin [name] [birth year] [id number]   [facility] CM received primary care provider referral for Kristin Kristin [name] from [facility] telephonic care coordinator, Halford Decamp.  The referral indicates Kristin [name] had a recent hospital discharge 04/02 (post surgery) and is having some hypotension (patient needs to know when to take medication). Patient has support from family members who take blood pressure & administer medication.   [facility] CM called and reached grand-daughter initially who stated contact (mobile) number is her and not Kristin [name]'  [facility] CM called confirmed home number and spoke with Kristin [name] who confirms she would like a home visit and has had concerns with her medications.    Plan To see Kristin [name] on Tuesday December 07 2016 at 10 am as agreed upon by Kristin [name]

## 2016-12-07 ENCOUNTER — Other Ambulatory Visit: Payer: Self-pay | Admitting: Nurse Practitioner

## 2016-12-07 ENCOUNTER — Telehealth: Payer: Self-pay | Admitting: Nurse Practitioner

## 2016-12-07 ENCOUNTER — Other Ambulatory Visit: Payer: Self-pay | Admitting: *Deleted

## 2016-12-07 DIAGNOSIS — R531 Weakness: Secondary | ICD-10-CM | POA: Diagnosis not present

## 2016-12-07 DIAGNOSIS — Z8679 Personal history of other diseases of the circulatory system: Secondary | ICD-10-CM | POA: Diagnosis not present

## 2016-12-07 DIAGNOSIS — I10 Essential (primary) hypertension: Secondary | ICD-10-CM | POA: Diagnosis not present

## 2016-12-07 DIAGNOSIS — J449 Chronic obstructive pulmonary disease, unspecified: Secondary | ICD-10-CM | POA: Diagnosis not present

## 2016-12-07 DIAGNOSIS — I4891 Unspecified atrial fibrillation: Secondary | ICD-10-CM | POA: Diagnosis not present

## 2016-12-07 MED ORDER — FUROSEMIDE 40 MG PO TABS: 40.0000 mg | ORAL_TABLET | Freq: Every day | ORAL | 0 refills | Status: DC

## 2016-12-07 NOTE — Patient Outreach (Addendum)
[facility]) Care Management   12/07/2016  CICI RODRIGES 11-Jun-1930 [id number]  TAYGEN NEWSOME is an 83 y.o. female with PMH of Chronic diastolic heart failure, COPD, HTN, Chronic kidney disease (CKD), stage III (moderate), s/p pacemaker, CAFL (chronic airflow limitation), Complete rotator cuff rupture of shoulder (Left); DDD (degenerative disc disease), lumbosacral; Gout; H/O cardiac catheterization; Arrhythmia, sinus node (HCC, Lumbar canal stenosis; Urinary frequency/UTI, Seasonal allergies; Breast cancer of right breast, Chronic pain syndrome; Chronic low back pain, Restless leg syndrome, Neurogenic pain; Long term current use of opiate analgesic; Chronic Opioid-induced constipation, Vitamin D insufficiency, 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 S/p laparoscopic cholecystectomy on 11/20/2016 and ERCP for stone removal, biliary sphincterotomy after an admission on 11/16/2016 to [facility] with acute cholecystitis complicated by choledocholithiasis and E.coli bacteremia. When she was discharged on 11/22/16, her home lasix and lisinopril were held due to mild AKI and hypotension during hospitalization.    Ms Sieben was referred to [place] on 12/01/16 for complex case management (for recent hospital discharge, post surgery, hx heart failure-pacemaker; having hypotension that requires managing medication; patient is falls risk) after contacted by [facility] telephonic CM, L Manning.  Ms Botello states her grand daughter manages her medications and takes her blood pressure twice daily and helps her with personal care. States son & grand daughter assist with meals & take her to MD appointments. She is seen by [facility] home care PT ("mel") and OT Timmothy Sours") services   She had her pcp follow up appointment on 11/26/16 with Winn Jock, NP at [facility].  Reports her MD, Dr Luan Pulling retired in March 2018.  Ms Garciagarcia mentioned she may consider changing pcps to have a MD vs NP.  Subjective: "My feet have been swelling for over a week now" "I have been keeping them raised up"  Objective:   Pt reports this am BP of 118/74 pulse 78 prior to [facility] CM arrival (entered in her [facility] therapy notebook)  BP 130/80 (BP Location: right arm, Patient Position: Sitting Manual cuff size normal pulse 62 Resp 22 SpO2 92% (sats initially started off at 84%)  BP 133/80 (BP Location: Right Arm, Patient Position: Sitting, Cuff Size: Normal)   Pulse 78   Resp (!) 22   SpO2 92%  (This BP and pulse was taken using Ms Schlemmer Om[employer] electronic BP cuff (purchased by son) when [name], son inquired if electronic cuff was accurate)   Prior to leaving her home THN CM rechecked her SpO2 =91 pulse 72  Review of Systems  Constitutional: Positive for malaise/fatigue. Negative for chills, diaphoresis, fever and weight loss.  HENT: Positive for hearing loss. Negative for congestion, ear discharge, ear pain, nosebleeds, sinus pain, sore throat and tinnitus.   Eyes: Negative for blurred vision, double vision, photophobia, pain, discharge and redness.  Respiratory: Positive for cough and sputum production. Negative for hemoptysis, shortness of breath, wheezing and stridor.   Cardiovascular: Positive for leg swelling. Negative for chest pain, palpitations, orthopnea, claudication and PND.  Gastrointestinal: Negative for abdominal pain, blood in stool, constipation, diarrhea, heartburn, melena, nausea and vomiting.  Genitourinary: Negative for dysuria, flank pain, frequency, hematuria and urgency.  Musculoskeletal: Positive for myalgias. Negative for back pain, falls, joint pain and neck pain.  Skin: Negative for itching and rash.  Neurological: Negative for dizziness, tingling, tremors, sensory change, speech change, focal  weakness, seizures, loss of  consciousness, weakness and headaches.  Endo/Heme/Allergies: Negative for environmental allergies and polydipsia. Does not bruise/bleed easily.  Psychiatric/Behavioral: Negative for depression, hallucinations, memory loss, substance abuse and suicidal ideas. The patient is not nervous/anxious and does not have insomnia.     Physical Exam  Constitutional: She is oriented to person, place, and time. She appears well-developed and well-nourished.  HENT:  Head: Normocephalic and atraumatic.  Right Ear: External ear normal.  Left Ear: External ear normal.  Mouth/Throat: Oropharynx is clear and moist.  Eyes: Conjunctivae are normal. Pupils are equal, round, and reactive to light.  Neck: Normal range of motion. Neck supple.  Cardiovascular: Normal rate, normal heart sounds and intact distal pulses.   Respiratory: Effort normal and breath sounds normal.  GI: Soft. Bowel sounds are normal. There is tenderness.    Musculoskeletal: She exhibits edema.       Right ankle: She exhibits swelling.       Left ankle: She exhibits swelling.  Neurological: She is alert and oriented to person, place, and time.  Skin: Skin is warm and dry.  Psychiatric: She has a normal mood and affect. Her behavior is normal. Judgment and thought content normal.    Encounter Medications:   Outpatient Encounter Prescriptions as of 12/07/2016  Medication Sig Note  . albuterol (PROVENTIL) (2.5 MG/3ML) 0.083% nebulizer solution Take 3 mLs (2.5 mg total) by nebulization every 8 (eight) hours as needed. Shortness of breath and wheezing   . apixaban (ELIQUIS) 5 MG TABS tablet Take 1 tablet (5 mg total) by mouth 2 (two) times daily.   Derrill Memo ON 12/13/2016] bisacodyl (DULCOLAX) 5 MG EC tablet Take 2 tablets (10 mg total) by mouth daily as needed for moderate constipation.   . Cholecalciferol (VITAMIN D) 2000 units tablet Take 2,000 Units by mouth daily.    . furosemide (LASIX) 40 MG tablet Take 1 tablet (40 mg total) by mouth  daily.   Marland Kitchen levocetirizine (XYZAL) 5 MG tablet Take 1 tablet (5 mg total) by mouth every evening.   Derrill Memo ON 02/11/2017] Oxycodone HCl 20 MG TABS Take 1 tablet (20 mg total) by mouth every 8 (eight) hours as needed. 11/11/2016: DO NOT DELETE, even if Expired!!! See Dr. Adalberto Cole care coordination note.  Derrill Memo ON 12/13/2016] Oxycodone HCl 20 MG TABS Take 1 tablet (20 mg total) by mouth every 8 (eight) hours as needed. 11/11/2016: DO NOT DELETE, even if Expired!!! See Dr. Adalberto Cole care coordination note.  Derrill Memo ON 01/12/2017] Oxycodone HCl 20 MG TABS Take 1 tablet (20 mg total) by mouth every 8 (eight) hours as needed. 11/11/2016: DO NOT DELETE, even if Expired!!! See Dr. Adalberto Cole care coordination note.  . OXYGEN Inhale 2 Doses into the lungs at bedtime.   Derrill Memo ON 12/13/2016] rOPINIRole (REQUIP) 2 MG tablet Take 1 tablet (2 mg total) by mouth at bedtime.   . senna-docusate (SENOKOT-S) 8.6-50 MG tablet Take 2 tablets by mouth 2 (two) times daily.   Marland Kitchen aspirin EC 325 MG tablet Take 325 mg by mouth daily.    . furosemide (LASIX) 20 MG tablet Take 1 tablet (20 mg total) by mouth daily. (Patient not taking: Reported on 12/07/2016) 11/26/2016: On hold till office visit  . lisinopril (PRINIVIL,ZESTRIL) 10 MG tablet TAKE 1 TABLET BY MOUTH ONCE DAILY. (Patient not taking: Reported on 11/26/2016) 11/26/2016: On hold till hospital f/u pcp.  Marland Kitchen omeprazole (PRILOSEC) 20 MG capsule    . polyethylene glycol (MIRALAX / GLYCOLAX)  packet Take 17 g by mouth daily as needed. 12/07/2016: Finished on 12/06/16 - box empty  . Wheat Dextrin (BENEFIBER) POWD Stir 2 tsp. TID into 4-8 oz of any non-carbonated beverage or soft food (hot or cold)    No facility-administered encounter medications on file as of 12/07/2016.     Functional Status:   In your present state of health, do you have any difficulty performing the following activities: 11/30/2016 12/24/2015  Hearing? Y N  Vision? N N  Difficulty concentrating or making  decisions? N N  Walking or climbing stairs? Y Y  Dressing or bathing? Y N  Doing errands, shopping? Y N  Some recent data might be hidden    Fall/Depression Screening:    PHQ 2/9 Scores 11/30/2016 11/11/2016 09/14/2016 07/28/2016 07/21/2016 07/07/2016 06/29/2016  PHQ - 2 Score 0 0 0 0 0 0 0  Exception Documentation - - - - - - -    Assessment:   Upon THN CM arrival to visit Ms Maltz she was greeted by Ms Munnerlyn son as Ms Bier was reclining with her legs elevated resting.  She confirmed her name, dob, today's date/time, her address and her united healthcare medicare coverage with CM. THN CM reviewed THN information packet and Ms Awan signed her [facility] consent form (her copies placed in [facility] information package). She confirms her granddaughter, Jacob Moores and her son, [name] may be contacted by [facility] staff as needed. She has a rolling walker but when ambulating is using her cane which she states she prefers. DME in the home includes a bp cuff, oxygen, nebulizer, cane, rolling walker and her bathroom has been redesigned to include a walk in shower with bench, grab bars and shower head (assisted by [place] [place] vocational rehabilitation services of [place]. When [facility] CM inquired about a bedside commode, Ms Callanan stated "I don't have one and don't need one"   Acute Medical Conditions (CHF, swelling of both lower extremities): Ms Nazaryan states she has been managing CHF and swelling with medications (lasix 20 mg recently started taking in mornings because at night she was getting up to void "every hour"), "mustard for spasms" as needed and elevation.  She was noted to be sniffing and coughing nonproductively throughout the visit.  She states she believes these symptoms are related to "seasonal allergies" THN CM discussed these symptoms related to CHF and lisinopril use. Both of her feet, toes are swollen with decreased pulses, THN CM measured her ankles and the top of feet.  Circumference Measurements:  right ankle  9 inches, top of right foot 12 inches       Left ankle 9.5 inches, top of left foot 13 inches, indicating > swelling in left       2-3+Pitting Edema She confirms she is voiding fair amounts of light yellow urine. She states she is not on a fluid restriction and during the visit she drank 2 8 ounce glasses of iced water. She states she does not weigh herself daily and was encouraged to weigh daily after educating her on the CHF yellow and red zones that includes a daily weight gain of 3 lbs and a weekly gain of 5 lbs is reportable to her doctor. The last weight noted in 11/26/16 pcp office visit note was 183 lbs with a ht of 69 inches.and a BMI of 27.10 See below THN interventions in Plan section  Chronic Medical Conditions (COPD, HTN): Ms Erlinda Hong manages her COPD and HTN with medications (to include nebulizer treatments and  oxygen at 2 L [place] at night -both DME provided by [employer]), a low sodium diet and frequent BP monitoring (especially for hypotension episodes of 100/60 or below).  The last noted flu shot was noted on 06/22/16 and last pneumovax noted in chart on 08/23/2008. Ms. Holten was not aware of her type of COPD. CM discussed bronchitis, emphysema, normal oxygen saturation values and taught her how to use her incentive spirometry she had not opened.  Last BM reported on 12/06/16  Surgical incision:  All of Ms Holzheimer laparoscopic sites with signs of healing except the upper midline epigastric site that was noted to have a hard moveable  2.5 cm area that Ms Deisher states causes minimal pain that is relieved by her oxycodone use. She has a surgical follow up appointment "soon" and a 12/13/16 pain management follow up appointment.  DME cost; Ms Snooks voiced concern about the cost of her nebulizer and oxygen being obtained from [place].  She is not aware if [employer] is in or out of network with [place] healthcare coverage.  CM discussed in and out of network coverage.   Patient goals: Ms Marzec reports her  immediate goals are 1) to decrease the swelling in her lower extremities and to see about possible decreasing the costs of her medications and DME  Plan:  [facility] CM contacted the pcp office for assistance with management of swollen extremities. Ms Bulger reported not having a follow up Cardiologist appointment until May 2018. Winn Jock provided an order for Lasix to be increased from 20 mg to 40 mg qd for 7 days when precaution, if BP is equal to or lower than 100/60, the Lasix is to be held. This order was sent in to Ms [facility] pharmacy of preference, [facility] in [place]. NP states she will try to get Ms Zoeller an earlier Cardiology appointment with Paras[facility] at [facility] in [place] (versus May 2018-Unable to view appt on EPIC). THN CM wrote this change in Lasix with perimeters and taped it to her Lasix bottle to assist her granddaughter, [name] with filling Ms Frey pill box. Ms Rockers was able to repeat this change in Lasix dosing using the teach back method.  THN CM provided a THN 2018 calendar, documented her upcoming appointments and wrote down when her 7 day course of Lasix would be completed (12/13/16). Ms Bolduc agreed to a follow up call from [facility] CM to check on her progress with change in Lasix later this week but encouraged Ms Erlinda Hong or son to contact [facility] CM as needed. THN CM provided a 7 day THN pill box versus her daily pill box. THN CM confirmed Dr Parks Ranger is now the primary medical provider for [facility] and discussed this with Ms Quarry  Ms Borunda confirmed with [facility] CM that she has a Therapist, music [place] address but live in [place] when [employer] offered [employer] senior resources She states she is aware of the [facility] senior resources but has not participated in the program at this time. THN sent barrier letter to pcp and successful visit letter to Ms [facility] sent referral to [place] CM sent [facility] CM notes to pcp and CV providers  [facility] CM Care  Plan Problem One     Most Recent Value  Care Plan Problem One  Knowledge deficit of congestive heart failure home management (signs/symptoms, treatment interventions  Role Documenting the Problem One  Care Management [place] for Problem One  Active  THN Long Term Goal (31-90 days)  Over the next 45 days the patient will be able to verbalize knowledge  and demonstrate home management interventions for CHF  THN Long Term Goal Start Date  12/07/16  Interventions for Problem One Long Term Goal  Assess and discuss CHF diagnosis, sympotoms/zone symptoms to report to doctor, diet, activity, medications , weights Provided  written materials, monitor for symptoms like sob weight gain & edema  THN CM Short Term Goal #1 (0-30 days)  over the next 7 days the patient will take ordered diuretics to decrease CHF symptom of edema with evidence of decreased edema of lower extremeties  THN CM Short Term Goal #1 Start Date  12/07/16  Interventions for Short Term Goal #1  Assess zone CHF symptoms, examined feet, lungs, medications, called pcp with noted CHF symptoms, Order for increase in lasix obtained Educated pt on zone symptoms, medications (how to take new ordered medications) and when to call pcp Provieded written CHF materials   THN CM Short Term Goal #2 (0-30 days)  Over the next 14-21 days the patient will be able to knowledge about CHF home treatment to include verbalizing zone symptoms she will call into her doctor, daily weights, diet, activity and understanding of use of CHF medications  THN CM Short Term Goal #2 Start Date  12/07/16  Interventions for Short Term Goal #2  assess and education on CHF home treatment using CHF booklet/handouts/Use teach back method to evaluate knowledge of CHF home care needs     [facility] CM Care Plan Problem Two     Most Recent Value  Care Plan Problem Two  Possible side effect of Lisinopril or CHF yellow zone coughing symptom assistance needed plus  increase cost  in medications reported (referral to [facility] pharmacist)   Role Documenting the Problem Two  Care Management [place] for Problem Two  Active  Interventions for Problem Two Long Term Goal   Discussed lisinopril side effect with pcp, NP and patient, [facility] pharmacy referral after permission given  THN Long Term Goal (31-90) days  over the next 30 - 45 days patient will be assisted with lisinopril and her medication costs  THN Long Term Goal Start Date  12/07/16  THN CM Short Term Goal #1 (0-30 days)  Over the next 30 days the patient will receive [place] referral for asisstance with Lisinopril use and possible assistance with costs of medications  THN CM Short Term Goal #1 Start Date  12/07/16  Interventions for Short Term Goal #2   [facility] pharmacy referral Discussed CHF symptoms related to medicines       Kimberly L. Lavina Hamman, RN, BSN, [place] Care Management [phone]

## 2016-12-07 NOTE — Progress Notes (Signed)
Telephone call from [place], [place] with [facility] who is at Kristin [name] home for a home visit.  Patient with increased pedal edema and tenderness, worse in left leg.   She is currently taking 20 mg lasix daily.  She was discharged from hospital and will not see cardio until May, Dr. Saralyn Pilar at [facility].  Pt's vitals are 130/80 and pulse 62.   Increase dose.  Take lasix 40 mg once daily for 7 days.  Hold if BP less than 100/60.  After 7 days, resume lasix 20 mg once daily.  7-day prescription of lasix sent to pharmacy.

## 2016-12-07 NOTE — Telephone Encounter (Signed)
Joelene Millin, nurse case manager with [facility] is visiting pt today and pt has extreme edema in legs.  They are very tight and temder especially in left leg.  She is currently taking 40 mg lasix daily.  She was discharged from hospital and will not see cardio until May, Dr. Saralyn Pilar at [facility].  Joelene Millin asked for treatment recommendations including increasing lasix.  Pt's vitals are 130/80 and pulse 62.  Her call back number is [phone]

## 2016-12-09 DIAGNOSIS — Z8679 Personal history of other diseases of the circulatory system: Secondary | ICD-10-CM | POA: Diagnosis not present

## 2016-12-09 DIAGNOSIS — I10 Essential (primary) hypertension: Secondary | ICD-10-CM | POA: Diagnosis not present

## 2016-12-09 DIAGNOSIS — J45998 Other asthma: Secondary | ICD-10-CM | POA: Diagnosis not present

## 2016-12-09 DIAGNOSIS — J449 Chronic obstructive pulmonary disease, unspecified: Secondary | ICD-10-CM | POA: Diagnosis not present

## 2016-12-09 DIAGNOSIS — I4891 Unspecified atrial fibrillation: Secondary | ICD-10-CM | POA: Diagnosis not present

## 2016-12-09 DIAGNOSIS — R531 Weakness: Secondary | ICD-10-CM | POA: Diagnosis not present

## 2016-12-09 NOTE — Telephone Encounter (Signed)
Patient instructed to take furosemide 40 mg daily for 7 days between 4/17 - 4/23. Then, resume 20 mg daily.

## 2016-12-10 ENCOUNTER — Telehealth: Payer: Self-pay | Admitting: *Deleted

## 2016-12-10 ENCOUNTER — Other Ambulatory Visit: Payer: Self-pay | Admitting: *Deleted

## 2016-12-10 ENCOUNTER — Telehealth: Payer: Self-pay | Admitting: Nurse Practitioner

## 2016-12-10 DIAGNOSIS — N183 Chronic kidney disease, stage 3 unspecified: Secondary | ICD-10-CM

## 2016-12-10 DIAGNOSIS — I5032 Chronic diastolic (congestive) heart failure: Secondary | ICD-10-CM

## 2016-12-10 MED ORDER — FUROSEMIDE 40 MG PO TABS: 40.0000 mg | ORAL_TABLET | Freq: Every day | ORAL | 0 refills | Status: DC

## 2016-12-10 NOTE — Telephone Encounter (Signed)
Kim with  [facility] have called and requested that you call her at  [phone].

## 2016-12-10 NOTE — Patient Outreach (Addendum)
[facility]) Care Management  12/10/2016  Kristin [name] Mar 08, 1930 [id number]   Telephone Assessment:    Acute Medical Conditions: Swelling of lower extremities Kristin [name] reports she continues to have pain of both her feet but is able to walk on them.  Reports she is wearing ted hose today. She confirms she has had increased voiding but feet remain swollen. She is alert and oriented to person, place, time and situation. She has kept her feet elevated. Denies increased sob, abdominal swelling, open areas on lower extremities, no seeping of fluid from swollen legs, not needing to use her inhaler/nebulizer more than normal and chest pain/cardiac symptoms. She denies constipation. Last BM 12/09/17 soft formed She continues to use Lasix 40 mg qd for 7 days then is scheduled to return to Lasix 20 mg qd   Plan:   THN Cm assessed for increased symptoms and informed Kristin [name] that CM would contact [place] center staff for further recommendations.  Kristin [name] voiced appreciation. THN CM called and spoke with Kristin [name] at 1524 12/10/16 at [place] center who will give Kristin [name] [facility] CM message including CM mobile return number Pending return call to Kristin [name] or [facility] CM  Routed note to pcp and CV providers Attempt to call CV but office closed for the day    Kanon Colunga L. Lavina Hamman, RN, BSN, [place] Care Management [phone]

## 2016-12-10 NOTE — Patient Outreach (Signed)
[facility]) Care Management  12/10/2016  Kristin [name] 08-14-1930 [id number]   Care coordination CHF follow calls to pt, pcp, CV and family  [facility] CM received a call from [name], NP at [facility] medical center. Reviewed Kristin [name] symptoms.  Recommendation is to continue taking the ordered Lasix 40 mg daily until she can consult with her cardiologist. Unable to determined in EPIC nor [facility]  the Cardiologist hospital follow up appointment date. [name] also voiced concern noted information about recent hospital weights  [name] NP 11/26/16 note states "Prior to hospitalization weight 11/11/2016 = 167 lbs 11/25/2016: 182 lbs 11/26/2016: 183 lbs"  Plans  THN CM to make another attempt to contact Kristin [name] Cardiologist office on Monday to determine his recommendation for treatment.  Kristin [name] counsel to monitor for CHF yellow and red zones this weekend.on first call to her today [name] CM attempted to reach Kristin [name] to follow up with her on pcp recommendations but no answer x 2 Kristin [name] gave verbal and written permission for [name] staff to contact her son, Kristin [name] and grand daughter, Kristin [name] as needed on 12/07/16. THN CM called son , [name]'s number without an answer THN CM called grand daughter Kristin [name] and spoke with her to discuss attempt to call Kristin [name] and Kristin [name] without success.  THN CM discussed with Kristin [name] the pcp recommendations for Kristin [name] to continue Lasix 40 mg qd until contact made to Cardiologist to check for other recommendations.  Kristin [name] confirmed she would be seeing Kristin [name] within the next hour and voiced appreciation of services to [facility] CM. Routed note to pcp, CV  Kristin [name] L. Lavina Hamman, RN, BSN, [place] Care Management [phone]

## 2016-12-10 NOTE — Patient Outreach (Signed)
[facility]) Care Management  12/10/2016  MARIEN MANSHIP [birth year] [id number]  Care Coordination follow up call from patient [facility] CM received a return call from Mrs Glanz to tell [facility] CM her phone was off the hook and [name] had provided the message to continue Lasix at 40 mg. Mrs Cong confirmed she did not have a CV appointment but she would call the CV also to get a follow up appointment and would continue the Lasix 40 mg qd until otherwise advised by her CV MD. Mrs Berenice Primas voiced appreciation for [facility] CM services    Plans:  [facility] CM follow up with Mrs Kassem and her CV MD on next week   Elanna Bert L. Lavina Hamman, RN, BSN, [place] Care Management [phone]

## 2016-12-10 NOTE — Telephone Encounter (Addendum)
Kristin [name] from [facility]:  Denies other symptoms of CHF.  Able to walk on legs -only tight swollen legs.  Today she continues to have pain in both feet.able to walk. Wearing Ted hose.  Voiding more, but her feet are still swollen.  A&O.  Elevating feet.  Denies sob, abd. Swelling, no seeping fluids in legs. Not needed to use inhaler/nebs more than normal.  No chest pain or cardiac symptoms.  LBM 4/19 soft and formed.  BP - Counseled to hold pill and has taken every.   RN reviewed HF zones with patient.    Kristin [name] will follow up with Cardiology office to see if there can be an earlier follow up appointment.  Next scheduled appointment is on 04/04/2017.  For now, continue lasix 40 mg daily until further instructions given.

## 2016-12-10 NOTE — Addendum Note (Signed)
Addended by: Cleaster Corin on: 12/10/2016 05:15 PM   Modules accepted: Orders

## 2016-12-13 ENCOUNTER — Encounter: Payer: Self-pay | Admitting: Pain Medicine

## 2016-12-13 ENCOUNTER — Ambulatory Visit (HOSPITAL_BASED_OUTPATIENT_CLINIC_OR_DEPARTMENT_OTHER): Payer: Medicare Other | Admitting: Pain Medicine

## 2016-12-13 ENCOUNTER — Ambulatory Visit
Admission: RE | Admit: 2016-12-13 | Discharge: 2016-12-13 | Disposition: A | Discharge status: Home / Self Care | Payer: Medicare Other | Source: Ambulatory Visit | Attending: Pain Medicine | Admitting: Pain Medicine

## 2016-12-13 VITALS — BP 155/87 | HR 60 | Temp 97.7°F | Resp 13 | Ht 68.0 in | Wt 168.0 lb

## 2016-12-13 DIAGNOSIS — M545 Low back pain, unspecified: Secondary | ICD-10-CM

## 2016-12-13 DIAGNOSIS — M4696 Unspecified inflammatory spondylopathy, lumbar region: Secondary | ICD-10-CM | POA: Diagnosis not present

## 2016-12-13 DIAGNOSIS — M47816 Spondylosis without myelopathy or radiculopathy, lumbar region: Secondary | ICD-10-CM

## 2016-12-13 DIAGNOSIS — G8929 Other chronic pain: Secondary | ICD-10-CM | POA: Diagnosis not present

## 2016-12-13 DIAGNOSIS — M47896 Other spondylosis, lumbar region: Secondary | ICD-10-CM | POA: Diagnosis not present

## 2016-12-13 DIAGNOSIS — Z888 Allergy status to other drugs, medicaments and biological substances status: Secondary | ICD-10-CM | POA: Insufficient documentation

## 2016-12-13 DIAGNOSIS — Z881 Allergy status to other antibiotic agents status: Secondary | ICD-10-CM | POA: Diagnosis not present

## 2016-12-13 DIAGNOSIS — G8918 Other acute postprocedural pain: Secondary | ICD-10-CM | POA: Insufficient documentation

## 2016-12-13 MED ORDER — LACTATED RINGERS IV SOLN
1000.0000 mL | Freq: Once | INTRAVENOUS | Status: AC
  Administered 2016-12-13: 1000 mL via INTRAVENOUS

## 2016-12-13 MED ORDER — FENTANYL CITRATE (PF) 100 MCG/2ML IJ SOLN
25.0000 ug | INTRAMUSCULAR | Status: DC | PRN
  Administered 2016-12-13: 50 ug via INTRAVENOUS
  Filled 2016-12-13: qty 2

## 2016-12-13 MED ORDER — ROPIVACAINE HCL 2 MG/ML IJ SOLN
9.0000 mL | Freq: Once | INTRAMUSCULAR | Status: AC
  Administered 2016-12-13: 9 mL via PERINEURAL
  Filled 2016-12-13: qty 10

## 2016-12-13 MED ORDER — MIDAZOLAM HCL 5 MG/5ML IJ SOLN
1.0000 mg | INTRAMUSCULAR | Status: DC | PRN
  Administered 2016-12-13: 1 mg via INTRAVENOUS
  Filled 2016-12-13: qty 5

## 2016-12-13 MED ORDER — LIDOCAINE HCL (PF) 1 % IJ SOLN
10.0000 mL | Freq: Once | INTRAMUSCULAR | Status: AC
  Administered 2016-12-13: 5 mL
  Filled 2016-12-13: qty 10

## 2016-12-13 MED ORDER — TRIAMCINOLONE ACETONIDE 40 MG/ML IJ SUSP
40.0000 mg | Freq: Once | INTRAMUSCULAR | Status: AC
  Administered 2016-12-13: 40 mg
  Filled 2016-12-13: qty 1

## 2016-12-13 MED ORDER — HYDROCODONE-ACETAMINOPHEN 5-325 MG PO TABS: 1.0000 | ORAL_TABLET | Freq: Three times a day (TID) | ORAL | 0 refills | Status: DC | PRN

## 2016-12-13 NOTE — Progress Notes (Signed)
Patient's Name: Kristin [name]  MRN: [id number]  Referring Provider: Milinda Pointer, MD  DOB: 02-16-1930  PCP: Mikey College, NP  DOS: 12/13/2016  Note by: Kathlen Brunswick. Dossie Arbour, MD  Service setting: Ambulatory outpatient  Location: [facility] (AMB) Pain Management Facility  Visit type: Procedure  Specialty: Interventional Pain Management  Patient type: Established   Primary Reason for Visit: Interventional Pain Management Treatment. CC: Back Pain (lower)  Procedure:  Anesthesia, Analgesia, Anxiolysis:  Type: Therapeutic Medial Branch Facet Radiofrequency Ablation Region: Lumbar Level: L2, L3, L4, L5, & S1 Medial Branch Level(s) Laterality: Left-Sided  Type: Local Anesthesia with Moderate (Conscious) Sedation Local Anesthetic: Lidocaine 1% Route: Intravenous (IV) IV Access: Secured Sedation: Meaningful verbal contact was maintained at all times during the procedure  Indication(s): Analgesia and Anxiety  Indications: 1. Lumbar facet syndrome (Bilateral) (L>R)   2. Lumbar facet hypertrophy (L1-2, L2-3, and L4-5)   3. Lumbar spondylosis   4. Chronic low back pain (Location of Primary Source of Pain) (Bilateral) (L>R)   5. Acute postoperative pain    Kristin [name] has either failed to respond, was unable to tolerate, or simply did not get enough benefit from other more conservative therapies including, but not limited to: 1. Over-the-counter medications 2. Anti-inflammatory medications 3. Muscle relaxants 4. Membrane stabilizers 5. Opioids 6. Physical therapy 7. Modalities (Heat, ice, etc.) 8. Invasive techniques such as nerve blocks. Kristin [name] has attained more than 50% relief of the pain from a series of diagnostic injections conducted in separate occasions.  Pain Score: Pre-procedure: 5 /10 Post-procedure: 0-No pain/10  Pre-op Assessment:  Previous date of service: 11/11/16 Service provided: Med Refill Kristin [name] is a 83 y.o. (year old), female patient, seen today for  interventional treatment. She  has a past surgical history that includes Abdominal hysterectomy; Back surgery; Rotator cuff repair; Breast surgery; Cataract extraction w/PHACO (10/19/2011); Cataract extraction w/PHACO (11/02/2011); Carpal tunnel release; Joint replacement; Colon surgery; Pacemaker insertion (N/A, 12/24/2015); and Cholecystectomy. Her primarily concern today is the Back Pain (lower)  Initial Vital Signs: Blood pressure (!) 113/94, pulse 72, temperature 97.7 F (36.5 C), resp. rate 18, height '5\' 8"'$  (1.727 m), weight 168 lb (76.2 kg), SpO2 92 %. BMI: 25.54 kg/m  Risk Assessment: Allergies: Reviewed. She is allergic to cyclobenzaprine; flexeril [cyclobenzaprine hcl]; and vancomycin.  Allergy Precautions: None required Coagulopathies: "Reviewed. None identified.  Blood-thinner therapy: None at this time Active Infection(s): Reviewed. None identified. Kristin [name] is afebrile  Site Confirmation: Kristin [name] was asked to confirm the procedure and laterality before marking the site Procedure checklist: Completed Consent: Before the procedure and under the influence of no sedative(s), amnesic(s), or anxiolytics, the patient was informed of the treatment options, risks and possible complications. To fulfill our ethical and legal obligations, as recommended by the American Medical Association's Code of Ethics, I have informed the patient of my clinical impression; the nature and purpose of the treatment or procedure; the risks, benefits, and possible complications of the intervention; the alternatives, including doing nothing; the risk(s) and benefit(s) of the alternative treatment(s) or procedure(s); and the risk(s) and benefit(s) of doing nothing. The patient was provided information about the general risks and possible complications associated with the procedure. These may include, but are not limited to: failure to achieve desired goals, infection, bleeding, organ or nerve damage, allergic  reactions, paralysis, and death. In addition, the patient was informed of those risks and complications associated to Spine-related procedures, such as failure to decrease pain; infection (i.e.: Meningitis, epidural or  intraspinal abscess); bleeding (i.e.: epidural hematoma, subarachnoid hemorrhage, or any other type of intraspinal or peri-dural bleeding); organ or nerve damage (i.e.: Any type of peripheral nerve, nerve root, or spinal cord injury) with subsequent damage to sensory, motor, and/or autonomic systems, resulting in permanent pain, numbness, and/or weakness of one or several areas of the body; allergic reactions; (i.e.: anaphylactic reaction); and/or death. Furthermore, the patient was informed of those risks and complications associated with the medications. These include, but are not limited to: allergic reactions (i.e.: anaphylactic or anaphylactoid reaction(s)); adrenal axis suppression; blood sugar elevation that in diabetics may result in ketoacidosis or comma; water retention that in patients with history of congestive heart failure may result in shortness of breath, pulmonary edema, and decompensation with resultant heart failure; weight gain; swelling or edema; medication-induced neural toxicity; particulate matter embolism and blood vessel occlusion with resultant organ, and/or nervous system infarction; and/or aseptic necrosis of one or more joints. Finally, the patient was informed that Medicine is not an exact science; therefore, there is also the possibility of unforeseen or unpredictable risks and/or possible complications that may result in a catastrophic outcome. The patient indicated having understood very clearly. We have given the patient no guarantees and we have made no promises. Enough time was given to the patient to ask questions, all of which were answered to the patient's satisfaction. Kristin [name] has indicated that she wanted to continue with the procedure. Attestation: I,  the ordering provider, attest that I have discussed with the patient the benefits, risks, side-effects, alternatives, likelihood of achieving goals, and potential problems during recovery for the procedure that I have provided informed consent. Date: 12/13/2016; Time: 10:45 AM  Pre-Procedure Preparation:  Monitoring: As per clinic protocol. Respiration, ETCO2, SpO2, BP, heart rate and rhythm monitor placed and checked for adequate function Safety Precautions: Patient was assessed for positional comfort and pressure points before starting the procedure. Time-out: I initiated and conducted the "Time-out" before starting the procedure, as per protocol. The patient was asked to participate by confirming the accuracy of the "Time Out" information. Verification of the correct person, site, and procedure were performed and confirmed by me, the nursing staff, and the patient. "Time-out" conducted as per Joint Commission's Universal Protocol (UP.01.01.01). "Time-out" Date & Time: 12/13/2016; 1140 hrs.  Description of Procedure Process:   Position: Prone Target Area: For Lumbar Facet blocks, the target is the groove formed by the junction of the transverse process and superior articular process. For the L5 dorsal ramus, the target is the notch between superior articular process and sacral ala. For the S1 dorsal ramus, the target is the superior and lateral edge of the posterior S1 Sacral foramen. Approach: Paraspinal approach. Area Prepped: Entire Posterior Lumbosacral Region Prepping solution: Hibiclens (4.0% Chlorhexidine gluconate solution) Safety Precautions: Aspiration looking for blood return was conducted prior to all injections. At no point did we inject any substances, as a needle was being advanced. No attempts were made at seeking any paresthesias. Safe injection practices and needle disposal techniques used. Medications properly checked for expiration dates. SDV (single dose vial) medications  used. Description of the Procedure: Protocol guidelines were followed. The patient was placed in position over the fluoroscopy table. The target area was identified and the area prepped in the usual manner. Skin desensitized using vapocoolant spray. Skin & deeper tissues infiltrated with local anesthetic. Appropriate amount of time allowed to pass for local anesthetics to take effect. Radiofrequency needles were introduced to the area of the medial branch  at the junction of the superior articular process and transverse process using fluoroscopy. Using the [employer], sensory stimulation using 50 Hz was used to locate & identify the nerve, making sure that the needle was positioned such that there was no sensory stimulation below 0.3 V or above 0.7 V. Stimulation using 2 Hz was used to evaluate the motor component. Care was taken not to lesion any nerves that demonstrated motor stimulation of the lower extremities at an output of less than 2.5 times that of the sensory threshold, or a maximum of 2.0 V. Once satisfactory placement of the needles was achieved, the above solution was slowly injected after negative aspiration. After waiting for at least 2 minutes, the ablation was performed at 80 degrees C for 60 seconds.The needles were then removed and the area cleansed, making sure to leave some of the prepping solution back to take advantage of its long term bactericidal properties. Vitals:   12/13/16 1221 12/13/16 1231 12/13/16 1241 12/13/16 1251  BP: (!) 161/79 (!) 149/78 (!) 147/77 (!) 155/87  Pulse: 60 64 60 60  Resp: '12 12 14 13  '$ Temp:      SpO2: 100% 96% 95% 93%  Weight:      Height:        Start Time: 1141 hrs. End Time: 1219 hrs. Materials & Medications:  Needle(s) Type: Teflon-coated, curved tip, Radiofrequency needle(s) Gauge: 22G Length: 10cm Medication(s): We administered lactated ringers, midazolam, fentaNYL, triamcinolone acetonide, lidocaine (PF), and ropivacaine  (PF) 2 mg/mL (0.2%). Please see chart orders for dosing details.  Imaging Guidance (Spinal):  Type of Imaging Technique: Fluoroscopy Guidance (Spinal) Indication(s): Assistance in needle guidance and placement for procedures requiring needle placement in or near specific anatomical locations not easily accessible without such assistance. Exposure Time: Please see nurses notes. Contrast: None used. Fluoroscopic Guidance: I was personally present during the use of fluoroscopy. "Tunnel Vision Technique" used to obtain the best possible view of the target area. Parallax error corrected before commencing the procedure. "Direction-depth-direction" technique used to introduce the needle under continuous pulsed fluoroscopy. Once target was reached, antero-posterior, oblique, and lateral fluoroscopic projection used confirm needle placement in all planes. Images permanently stored in EMR. Interpretation: No contrast injected. I personally interpreted the imaging intraoperatively. Adequate needle placement confirmed in multiple planes. Permanent images saved into the patient's record.  Antibiotic Prophylaxis:  Indication(s): None identified Antibiotic given: None  Post-operative Assessment:  EBL: None Complications: No immediate post-treatment complications observed by team, or reported by patient. Note: The patient tolerated the entire procedure well. A repeat set of vitals were taken after the procedure and the patient was kept under observation following institutional policy, for this type of procedure. Post-procedural neurological assessment was performed, showing return to baseline, prior to discharge. The patient was provided with post-procedure discharge instructions, including a section on how to identify potential problems. Should any problems arise concerning this procedure, the patient was given instructions to immediately contact us, at any time, without hesitation. In any case, we plan to contact  the patient by telephone for a follow-up status report regarding this interventional procedure. Comments:  No additional relevant information.  Plan of Care  Disposition: Discharge home  Discharge Date & Time: 12/13/2016; 1251 hrs.  Physician-requested Follow-up:  Return for Post-Procedure Eval, (6 wks), by MD.  Future Appointments Date Time Provider [place]  12/20/2016 10:00 AM Alisa Graff, FNP [facility] None  01/24/2017 1:00 PM Milinda Pointer, MD [facility] None  01/31/2017 11:00 AM Ander Purpura  Bonnye Fava, NP [facility] None  03/01/2017 11:00 AM Roberts, NP [facility] None  03/17/2017 11:00 AM Ander Purpura Bonnye Fava, NP [facility] None   Medications ordered for procedure: Meds ordered this encounter  Medications  . lactated ringers infusion 1,000 mL  . midazolam (VERSED) 5 MG/5ML injection 1-2 mg    Make sure Flumazenil is available in the pyxis when using this medication. If oversedation occurs, administer 0.2 mg IV over 15 sec. If after 45 sec no response, administer 0.2 mg again over 1 min; may repeat at 1 min intervals; not to exceed 4 doses (1 mg)  . fentaNYL (SUBLIMAZE) injection 25-50 mcg    Make sure Narcan is available in the pyxis when using this medication. In the event of respiratory depression (RR< 8/min): Titrate NARCAN (naloxone) in increments of 0.1 to 0.2 mg IV at 2-3 minute intervals, until desired degree of reversal.  . triamcinolone acetonide (KENALOG-40) injection 40 mg  . lidocaine (PF) (XYLOCAINE) 1 % injection 10 mL  . ropivacaine (PF) 2 mg/mL (0.2%) (NAROPIN) injection 9 mL  . HYDROcodone-acetaminophen (NORCO/VICODIN) 5-325 MG tablet    Sig: Take 1 tablet by mouth every 8 (eight) hours as needed for moderate pain.    Dispense:  30 tablet    Refill:  0    Do not place this medication, or any other prescription from our practice, on "Automatic Refill". Patient may have prescription filled one day early if pharmacy is closed on scheduled refill  date. Do not fill until: 12/13/16 To last until: 12/23/16   Medications administered: We administered lactated ringers, midazolam, fentaNYL, triamcinolone acetonide, lidocaine (PF), and ropivacaine (PF) 2 mg/mL (0.2%).  See the medical record for exact dosing, route, and time of administration.  Lab-work, Procedure(s), & Referral(s) Ordered: Orders Placed This Encounter  Procedures  . DG C-Arm 1-60 Min-No Report  . Informed Consent Details: Transcribe to consent form and obtain patient signature  . Provider attestation of informed consent for procedure/surgical case  . Verify informed consent  . Discharge instructions  . Follow-up   Imaging Ordered: Results for orders placed in visit on 07/28/16  DG C-Arm 1-60 Min-No Report   Narrative There is no report for this exam.   New Prescriptions   HYDROCODONE-ACETAMINOPHEN (NORCO/VICODIN) 5-325 MG TABLET    Take 1 tablet by mouth every 8 (eight) hours as needed for moderate pain.   Primary Care Physician: Mikey College, NP Location: [facility] Note by: Kathlen Brunswick. Dossie Arbour, M.D, DABA, DABAPM, DABPM, DABIPP, FIPP Date: 12/13/2016; Time: 6:07 PM  Disclaimer:  Medicine is not an Chief Strategy Officer. The only guarantee in medicine is that nothing is guaranteed. It is important to note that the decision to proceed with this intervention was based on the information collected from the patient. The Data and conclusions were drawn from the patient's questionnaire, the interview, and the physical examination. Because the information was provided in large part by the patient, it cannot be guaranteed that it has not been purposely or unconsciously manipulated. Every effort has been made to obtain as much relevant data as possible for this evaluation. It is important to note that the conclusions that lead to this procedure are derived in large part from the available data. Always take into account that the treatment will  also be dependent on availability of resources and existing treatment guidelines, considered by other Pain Management Practitioners as being common knowledge and practice, at the time of the intervention. For Medico-Legal purposes, it  is also important to point out that variation in procedural techniques and pharmacological choices are the acceptable norm. The indications, contraindications, technique, and results of the above procedure should only be interpreted and judged by a Board-Certified Interventional Pain Specialist with extensive familiarity and expertise in the same exact procedure and technique.  Instructions provided at this appointment: Patient Instructions   Post-Procedure instructions Instructions:  Apply ice: Fill a plastic sandwich bag with crushed ice. Cover it with a small towel and apply to injection site. Apply for 15 minutes then remove x 15 minutes. Repeat sequence on day of procedure, until you go to bed. The purpose is to minimize swelling and discomfort after procedure.  Apply heat: Apply heat to procedure site starting the day following the procedure. The purpose is to treat any soreness and discomfort from the procedure.  Food intake: Start with clear liquids (like water) and advance to regular food, as tolerated.   Physical activities: Keep activities to a minimum for the first 8 hours after the procedure.   Driving: If you have received any sedation, you are not allowed to drive for 24 hours after your procedure.  Blood thinner: Restart your blood thinner 6 hours after your procedure. (Only for those taking blood thinners)  Insulin: As soon as you can eat, you may resume your normal dosing schedule. (Only for those taking insulin)  Infection prevention: Keep procedure site clean and dry.  Post-procedure Pain Diary: Extremely important that this be done correctly and accurately. Recorded information will be used to determine the next step in treatment.  Pain  evaluated is that of treated area only. Do not include pain from an untreated area.  Complete every hour, on the hour, for the initial 8 hours. Set an alarm to help you do this part accurately.  Do not go to sleep and have it completed later. It will not be accurate.  Follow-up appointment: Keep your follow-up appointment after the procedure. Usually 2 weeks for most procedures. (6 weeks in the case of radiofrequency.) Bring you pain diary.  Expect:  From numbing medicine (AKA: Local Anesthetics): Numbness or decrease in pain.  Onset: Full effect within 15 minutes of injected.  Duration: It will depend on the type of local anesthetic used. On the average, 1 to 8 hours.   From steroids: Decrease in swelling or inflammation. Once inflammation is improved, relief of the pain will follow.  Onset of benefits: Depends on the amount of swelling present. The more swelling, the longer it will take for the benefits to be seen.   Duration: Steroids will stay in the system x 2 weeks. Duration of benefits will depend on multiple posibilities including persistent irritating factors.  From procedure: Some discomfort is to be expected once the numbing medicine wears off. This should be minimal if ice and heat are applied as instructed. Call if:  You experience numbness and weakness that gets worse with time, as opposed to wearing off.  New onset bowel or bladder incontinence. (Spinal procedures only)  Emergency Numbers:  Durning business hours (Monday - Thursday, 8:00 AM - 4:00 PM) (Friday, 9:00 AM - 12:00 Noon): [phone]  After hours: [phone] _____________________________________________________________________________________________  Radiofrequency Lesioning Radiofrequency lesioning is a procedure that is performed to relieve pain. The procedure is often used for back, neck, or arm pain. Radiofrequency lesioning involves the use of a machine that creates radio waves to make heat.  During the procedure, the heat is applied to the nerve that carries the  pain signal. The heat damages the nerve and interferes with the pain signal. Pain relief usually starts about 2 weeks after the procedure and lasts for 6 months to 1 year. Tell a health care provider about:  Any allergies you have.  All medicines you are taking, including vitamins, herbs, eye drops, creams, and over-the-counter medicines.  Any problems you or family members have had with anesthetic medicines.  Any blood disorders you have.  Any surgeries you have had.  Any medical conditions you have.  Whether you are pregnant or may be pregnant. What are the risks? Generally, this is a safe procedure. However, problems may occur, including:  Pain or soreness at the injection site.  Infection at the injection site.  Damage to nerves or blood vessels. What happens before the procedure?  Ask your health care provider about:  Changing or stopping your regular medicines. This is especially important if you are taking diabetes medicines or blood thinners.  Taking medicines such as aspirin and ibuprofen. These medicines can thin your blood. Do not take these medicines before your procedure if your health care provider instructs you not to.  Follow instructions from your health care provider about eating or drinking restrictions.  Plan to have someone take you home after the procedure.  If you go home right after the procedure, plan to have someone with you for 24 hours. What happens during the procedure?  You will be given one or more of the following:  A medicine to help you relax (sedative).  A medicine to numb the area (local anesthetic).  You will be awake during the procedure. You will need to be able to talk with the health care provider during the procedure.  With the help of a type of X-ray (fluoroscopy), the health care provider will insert a radiofrequency needle into the area to be  treated.  Next, a wire that carries the radio waves (electrode) will be put through the radiofrequency needle. An electrical pulse will be sent through the electrode to verify the correct nerve. You will feel a tingling sensation, and you may have muscle twitching.  Then, the tissue that is around the needle tip will be heated by an electric current that is passed using the radiofrequency machine. This will numb the nerves.  A bandage (dressing) will be put on the insertion area after the procedure is done. The procedure may vary among health care providers and hospitals. What happens after the procedure?  Your blood pressure, heart rate, breathing rate, and blood oxygen level will be monitored often until the medicines you were given have worn off.  Return to your normal activities as directed by your health care provider. This information is not intended to replace advice given to you by your health care provider. Make sure you discuss any questions you have with your health care provider. Document Released: 04/07/2011 Document Revised: 01/15/2016 Document Reviewed: 09/16/2014 Elsevier Interactive Patient Education  2017 [place]. Pain Management Discharge Instructions  General Discharge Instructions :  If you need to reach your doctor call: Monday-Friday 8:00 am - 4:00 pm at [phone] or toll free [phone].  After clinic hours [phone] to have operator reach doctor.  Bring all of your medication bottles to all your appointments in the pain clinic.  To cancel or reschedule your appointment with Pain Management please remember to call 24 hours in advance to avoid a fee.  Refer to the educational materials which you have been given on: General Risks, I had  my Procedure. Discharge Instructions, Post Sedation.  Post Procedure Instructions:  The drugs you were given will stay in your system until tomorrow, so for the next 24 hours you should not drive, make any legal  decisions or drink any alcoholic beverages.  You may eat anything you prefer, but it is better to start with liquids then soups and crackers, and gradually work up to solid foods.  Please notify your doctor immediately if you have any unusual bleeding, trouble breathing or pain that is not related to your normal pain.  Depending on the type of procedure that was done, some parts of your body may feel week and/or numb.  This usually clears up by tonight or the next day.  Walk with the use of an assistive device or accompanied by an adult for the 24 hours.  You may use ice on the affected area for the first 24 hours.  Put ice in a Ziploc bag and cover with a towel and place against area 15 minutes on 15 minutes off.  You may switch to heat after 24 hours.GENERAL RISKS AND COMPLICATIONS  What are the risk, side effects and possible complications? Generally speaking, most procedures are safe.  However, with any procedure there are risks, side effects, and the possibility of complications.  The risks and complications are dependent upon the sites that are lesioned, or the type of nerve block to be performed.  The closer the procedure is to the spine, the more serious the risks are.  Great care is taken when placing the radio frequency needles, block needles or lesioning probes, but sometimes complications can occur. 1. Infection: Any time there is an injection through the skin, there is a risk of infection.  This is why sterile conditions are used for these blocks.  There are four possible types of infection. 1. Localized skin infection. 2. Central Nervous System Infection-This can be in the form of Meningitis, which can be deadly. 3. Epidural Infections-This can be in the form of an epidural abscess, which can cause pressure inside of the spine, causing compression of the spinal cord with subsequent paralysis. This would require an emergency surgery to decompress, and there are no guarantees that the  patient would recover from the paralysis. 4. Discitis-This is an infection of the intervertebral discs.  It occurs in about 1% of discography procedures.  It is difficult to treat and it may lead to surgery.        2. Pain: the needles have to go through skin and soft tissues, will cause soreness.       3. Damage to internal structures:  The nerves to be lesioned may be near blood vessels or    other nerves which can be potentially damaged.       4. Bleeding: Bleeding is more common if the patient is taking blood thinners such as  aspirin, Coumadin, Ticiid, Plavix, etc., or if he/she have some genetic predisposition  such as hemophilia. Bleeding into the spinal canal can cause compression of the spinal  cord with subsequent paralysis.  This would require an emergency surgery to  decompress and there are no guarantees that the patient would recover from the  paralysis.       5. Pneumothorax:  Puncturing of a lung is a possibility, every time a needle is introduced in  the area of the chest or upper back.  Pneumothorax refers to free air around the  collapsed lung(s), inside of the thoracic cavity (chest cavity).  Another two possible  complications related to a similar event would include: Hemothorax and Chylothorax.   These are variations of the Pneumothorax, where instead of air around the collapsed  lung(s), you may have blood or chyle, respectively.       6. Spinal headaches: They may occur with any procedures in the area of the spine.       7. Persistent CSF (Cerebro-Spinal Fluid) leakage: This is a rare problem, but may occur  with prolonged intrathecal or epidural catheters either due to the formation of a fistulous  track or a dural tear.       8. Nerve damage: By working so close to the spinal cord, there is always a possibility of  nerve damage, which could be as serious as a permanent spinal cord injury with  paralysis.       9. Death:  Although rare, severe deadly allergic reactions known as  "Anaphylactic  reaction" can occur to any of the medications used.      10. Worsening of the symptoms:  We can always make thing worse.  What are the chances of something like this happening? Chances of any of this occuring are extremely low.  By statistics, you have more of a chance of getting killed in a motor vehicle accident: while driving to the hospital than any of the above occurring .  Nevertheless, you should be aware that they are possibilities.  In general, it is similar to taking a shower.  Everybody knows that you can slip, hit your head and get killed.  Does that mean that you should not shower again?  Nevertheless always keep in mind that statistics do not mean anything if you happen to be on the wrong side of them.  Even if a procedure has a 1 (one) in a 1,000,000 (million) chance of going wrong, it you happen to be that one..Also, keep in mind that by statistics, you have more of a chance of having something go wrong when taking medications.  Who should not have this procedure? If you are on a blood thinning medication (e.g. Coumadin, Plavix, see list of "Blood Thinners"), or if you have an active infection going on, you should not have the procedure.  If you are taking any blood thinners, please inform your physician.  How should I prepare for this procedure?  Do not eat or drink anything at least six hours prior to the procedure.  Bring a driver with you .  It cannot be a taxi.  Come accompanied by an adult that can drive you back, and that is strong enough to help you if your legs get weak or numb from the local anesthetic.  Take all of your medicines the morning of the procedure with just enough water to swallow them.  If you have diabetes, make sure that you are scheduled to have your procedure done first thing in the morning, whenever possible.  If you have diabetes, take only half of your insulin dose and notify our nurse that you have done so as soon as you arrive at the  clinic.  If you are diabetic, but only take blood sugar pills (oral hypoglycemic), then do not take them on the morning of your procedure.  You may take them after you have had the procedure.  Do not take aspirin or any aspirin-containing medications, at least eleven (11) days prior to the procedure.  They may prolong bleeding.  Wear loose fitting clothing that may be easy to  take off and that you would not mind if it got stained with Betadine or blood.  Do not wear any jewelry or perfume  Remove any nail coloring.  It will interfere with some of our monitoring equipment.  NOTE: Remember that this is not meant to be interpreted as a complete list of all possible complications.  Unforeseen problems may occur.  BLOOD THINNERS The following drugs contain aspirin or other products, which can cause increased bleeding during surgery and should not be taken for 2 weeks prior to and 1 week after surgery.  If you should need take something for relief of minor pain, you may take acetaminophen which is found in Tylenol,m Datril, Anacin-3 and Panadol. It is not blood thinner. The products listed below are.  Do not take any of the products listed below in addition to any listed on your instruction sheet.  A.P.C or A.P.C with Codeine Codeine Phosphate Capsules #3 Ibuprofen Ridaura  ABC compound Congesprin Imuran rimadil  Advil Cope Indocin Robaxisal  Alka-Seltzer Effervescent Pain Reliever and Antacid Coricidin or Coricidin-D  Indomethacin Rufen  Alka-Seltzer plus Cold Medicine Cosprin Ketoprofen S-A-C Tablets  Anacin Analgesic Tablets or Capsules Coumadin Korlgesic Salflex  Anacin Extra Strength Analgesic tablets or capsules CP-2 Tablets Lanoril Salicylate  Anaprox Cuprimine Capsules Levenox Salocol  Anexsia-D Dalteparin Magan Salsalate  Anodynos Darvon compound Magnesium Salicylate Sine-off  Ansaid Dasin Capsules Magsal Sodium Salicylate  Anturane Depen Capsules Marnal Soma  APF Arthritis pain  formula Dewitt's Pills Measurin Stanback  Argesic Dia-Gesic Meclofenamic Sulfinpyrazone  Arthritis Bayer Timed Release Aspirin Diclofenac Meclomen Sulindac  Arthritis pain formula Anacin Dicumarol Medipren Supac  Analgesic (Safety coated) Arthralgen Diffunasal Mefanamic Suprofen  Arthritis Strength Bufferin Dihydrocodeine Mepro Compound Suprol  Arthropan liquid Dopirydamole Methcarbomol with Aspirin Synalgos  ASA tablets/Enseals Disalcid Micrainin Tagament  Ascriptin Doan's Midol Talwin  Ascriptin A/D Dolene Mobidin Tanderil  Ascriptin Extra Strength Dolobid Moblgesic Ticlid  Ascriptin with Codeine Doloprin or Doloprin with Codeine Momentum Tolectin  Asperbuf Duoprin Mono-gesic Trendar  Aspergum Duradyne Motrin or Motrin IB Triminicin  Aspirin plain, buffered or enteric coated Durasal Myochrisine Trigesic  Aspirin Suppositories Easprin Nalfon Trillsate  Aspirin with Codeine Ecotrin Regular or Extra Strength Naprosyn Uracel  Atromid-S Efficin Naproxen Ursinus  Auranofin Capsules Elmiron Neocylate Vanquish  Axotal Emagrin Norgesic Verin  Azathioprine Empirin or Empirin with Codeine Normiflo Vitamin E  Azolid Emprazil Nuprin Voltaren  Bayer Aspirin plain, buffered or children's or timed BC Tablets or powders Encaprin Orgaran Warfarin Sodium  Buff-a-Comp Enoxaparin Orudis Zorpin  Buff-a-Comp with Codeine Equegesic Os-Cal-Gesic   Buffaprin Excedrin plain, buffered or Extra Strength Oxalid   Bufferin Arthritis Strength Feldene Oxphenbutazone   Bufferin plain or Extra Strength Feldene Capsules Oxycodone with Aspirin   Bufferin with Codeine Fenoprofen Fenoprofen Pabalate or Pabalate-SF   Buffets II Flogesic Panagesic   Buffinol plain or Extra Strength Florinal or Florinal with Codeine Panwarfarin   Buf-Tabs Flurbiprofen Penicillamine   Butalbital Compound Four-way cold tablets Penicillin   Butazolidin Fragmin Pepto-Bismol   Carbenicillin Geminisyn Percodan   Carna Arthritis Reliever Geopen  Persantine   Carprofen Gold's salt Persistin   Chloramphenicol Goody's Phenylbutazone   Chloromycetin Haltrain Piroxlcam   Clmetidine heparin Plaquenil   Cllnoril Hyco-pap Ponstel   Clofibrate Hydroxy chloroquine Propoxyphen         Before stopping any of these medications, be sure to consult the physician who ordered them.  Some, such as Coumadin (Warfarin) are ordered to prevent or treat serious conditions such as "deep thrombosis", "pumonary  embolisms", and other heart problems.  The amount of time that you may need off of the medication may also vary with the medication and the reason for which you were taking it.  If you are taking any of these medications, please make sure you notify your pain physician before you undergo any procedures.         Radiofrequency Lesioning Radiofrequency lesioning is a procedure that is performed to relieve pain. The procedure is often used for back, neck, or arm pain. Radiofrequency lesioning involves the use of a machine that creates radio waves to make heat. During the procedure, the heat is applied to the nerve that carries the pain signal. The heat damages the nerve and interferes with the pain signal. Pain relief usually starts about 2 weeks after the procedure and lasts for 6 months to 1 year. Tell a health care provider about:  Any allergies you have.  All medicines you are taking, including vitamins, herbs, eye drops, creams, and over-the-counter medicines.  Any problems you or family members have had with anesthetic medicines.  Any blood disorders you have.  Any surgeries you have had.  Any medical conditions you have.  Whether you are pregnant or may be pregnant. What are the risks? Generally, this is a safe procedure. However, problems may occur, including:  Pain or soreness at the injection site.  Infection at the injection site.  Damage to nerves or blood vessels. What happens before the procedure?  Ask your health care  provider about:  Changing or stopping your regular medicines. This is especially important if you are taking diabetes medicines or blood thinners.  Taking medicines such as aspirin and ibuprofen. These medicines can thin your blood. Do not take these medicines before your procedure if your health care provider instructs you not to.  Follow instructions from your health care provider about eating or drinking restrictions.  Plan to have someone take you home after the procedure.  If you go home right after the procedure, plan to have someone with you for 24 hours. What happens during the procedure?  You will be given one or more of the following:  A medicine to help you relax (sedative).  A medicine to numb the area (local anesthetic).  You will be awake during the procedure. You will need to be able to talk with the health care provider during the procedure.  With the help of a type of X-ray (fluoroscopy), the health care provider will insert a radiofrequency needle into the area to be treated.  Next, a wire that carries the radio waves (electrode) will be put through the radiofrequency needle. An electrical pulse will be sent through the electrode to verify the correct nerve. You will feel a tingling sensation, and you may have muscle twitching.  Then, the tissue that is around the needle tip will be heated by an electric current that is passed using the radiofrequency machine. This will numb the nerves.  A bandage (dressing) will be put on the insertion area after the procedure is done. The procedure may vary among health care providers and hospitals. What happens after the procedure?  Your blood pressure, heart rate, breathing rate, and blood oxygen level will be monitored often until the medicines you were given have worn off.  Return to your normal activities as directed by your health care provider. This information is not intended to replace advice given to you by your health  care provider. Make sure you discuss any questions you  have with your health care provider. Document Released: 04/07/2011 Document Revised: 01/15/2016 Document Reviewed: 09/16/2014 [employer] Interactive Patient Education  2017 [employer].

## 2016-12-13 NOTE — Progress Notes (Signed)
Safety precautions to be maintained throughout the outpatient stay will include: orient to surroundings, keep bed in low position, maintain call bell within reach at all times, provide assistance with transfer out of bed and ambulation.  

## 2016-12-13 NOTE — Patient Instructions (Addendum)
Post-Procedure instructions Instructions:  Apply ice: Fill a plastic sandwich bag with crushed ice. Cover it with a small towel and apply to injection site. Apply for 15 minutes then remove x 15 minutes. Repeat sequence on day of procedure, until you go to bed. The purpose is to minimize swelling and discomfort after procedure.  Apply heat: Apply heat to procedure site starting the day following the procedure. The purpose is to treat any soreness and discomfort from the procedure.  Food intake: Start with clear liquids (like water) and advance to regular food, as tolerated.   Physical activities: Keep activities to a minimum for the first 8 hours after the procedure.   Driving: If you have received any sedation, you are not allowed to drive for 24 hours after your procedure.  Blood thinner: Restart your blood thinner 6 hours after your procedure. (Only for those taking blood thinners)  Insulin: As soon as you can eat, you may resume your normal dosing schedule. (Only for those taking insulin)  Infection prevention: Keep procedure site clean and dry.  Post-procedure Pain Diary: Extremely important that this be done correctly and accurately. Recorded information will be used to determine the next step in treatment.  Pain evaluated is that of treated area only. Do not include pain from an untreated area.  Complete every hour, on the hour, for the initial 8 hours. Set an alarm to help you do this part accurately.  Do not go to sleep and have it completed later. It will not be accurate.  Follow-up appointment: Keep your follow-up appointment after the procedure. Usually 2 weeks for most procedures. (6 weeks in the case of radiofrequency.) Bring you pain diary.  Expect:  From numbing medicine (AKA: Local Anesthetics): Numbness or decrease in pain.  Onset: Full effect within 15 minutes of injected.  Duration: It will depend on the type of local anesthetic used. On the average, 1 to 8  hours.   From steroids: Decrease in swelling or inflammation. Once inflammation is improved, relief of the pain will follow.  Onset of benefits: Depends on the amount of swelling present. The more swelling, the longer it will take for the benefits to be seen.   Duration: Steroids will stay in the system x 2 weeks. Duration of benefits will depend on multiple posibilities including persistent irritating factors.  From procedure: Some discomfort is to be expected once the numbing medicine wears off. This should be minimal if ice and heat are applied as instructed. Call if:  You experience numbness and weakness that gets worse with time, as opposed to wearing off.  New onset bowel or bladder incontinence. (Spinal procedures only)  Emergency Numbers:  Durning business hours (Monday - Thursday, 8:00 AM - 4:00 PM) (Friday, 9:00 AM - 12:00 Noon): [phone]  After hours: [phone] _____________________________________________________________________________________________  Radiofrequency Lesioning Radiofrequency lesioning is a procedure that is performed to relieve pain. The procedure is often used for back, neck, or arm pain. Radiofrequency lesioning involves the use of a machine that creates radio waves to make heat. During the procedure, the heat is applied to the nerve that carries the pain signal. The heat damages the nerve and interferes with the pain signal. Pain relief usually starts about 2 weeks after the procedure and lasts for 6 months to 1 year. Tell a health care provider about:  Any allergies you have.  All medicines you are taking, including vitamins, herbs, eye drops, creams, and over-the-counter medicines.  Any problems you or family  members have had with anesthetic medicines.  Any blood disorders you have.  Any surgeries you have had.  Any medical conditions you have.  Whether you are pregnant or may be pregnant. What are the risks? Generally, this is a  safe procedure. However, problems may occur, including:  Pain or soreness at the injection site.  Infection at the injection site.  Damage to nerves or blood vessels. What happens before the procedure?  Ask your health care provider about:  Changing or stopping your regular medicines. This is especially important if you are taking diabetes medicines or blood thinners.  Taking medicines such as aspirin and ibuprofen. These medicines can thin your blood. Do not take these medicines before your procedure if your health care provider instructs you not to.  Follow instructions from your health care provider about eating or drinking restrictions.  Plan to have someone take you home after the procedure.  If you go home right after the procedure, plan to have someone with you for 24 hours. What happens during the procedure?  You will be given one or more of the following:  A medicine to help you relax (sedative).  A medicine to numb the area (local anesthetic).  You will be awake during the procedure. You will need to be able to talk with the health care provider during the procedure.  With the help of a type of X-ray (fluoroscopy), the health care provider will insert a radiofrequency needle into the area to be treated.  Next, a wire that carries the radio waves (electrode) will be put through the radiofrequency needle. An electrical pulse will be sent through the electrode to verify the correct nerve. You will feel a tingling sensation, and you may have muscle twitching.  Then, the tissue that is around the needle tip will be heated by an electric current that is passed using the radiofrequency machine. This will numb the nerves.  A bandage (dressing) will be put on the insertion area after the procedure is done. The procedure may vary among health care providers and hospitals. What happens after the procedure?  Your blood pressure, heart rate, breathing rate, and blood oxygen level  will be monitored often until the medicines you were given have worn off.  Return to your normal activities as directed by your health care provider. This information is not intended to replace advice given to you by your health care provider. Make sure you discuss any questions you have with your health care provider. Document Released: 04/07/2011 Document Revised: 01/15/2016 Document Reviewed: 09/16/2014 Elsevier Interactive Patient Education  2017 [place]. Pain Management Discharge Instructions  General Discharge Instructions :  If you need to reach your doctor call: Monday-Friday 8:00 am - 4:00 pm at [phone] or toll free [phone].  After clinic hours [phone] to have operator reach doctor.  Bring all of your medication bottles to all your appointments in the pain clinic.  To cancel or reschedule your appointment with Pain Management please remember to call 24 hours in advance to avoid a fee.  Refer to the educational materials which you have been given on: General Risks, I had my Procedure. Discharge Instructions, Post Sedation.  Post Procedure Instructions:  The drugs you were given will stay in your system until tomorrow, so for the next 24 hours you should not drive, make any legal decisions or drink any alcoholic beverages.  You may eat anything you prefer, but it is better to start with liquids then soups and crackers, and  gradually work up to solid foods.  Please notify your doctor immediately if you have any unusual bleeding, trouble breathing or pain that is not related to your normal pain.  Depending on the type of procedure that was done, some parts of your body may feel week and/or numb.  This usually clears up by tonight or the next day.  Walk with the use of an assistive device or accompanied by an adult for the 24 hours.  You may use ice on the affected area for the first 24 hours.  Put ice in a Ziploc bag and cover with a towel and place against  area 15 minutes on 15 minutes off.  You may switch to heat after 24 hours.GENERAL RISKS AND COMPLICATIONS  What are the risk, side effects and possible complications? Generally speaking, most procedures are safe.  However, with any procedure there are risks, side effects, and the possibility of complications.  The risks and complications are dependent upon the sites that are lesioned, or the type of nerve block to be performed.  The closer the procedure is to the spine, the more serious the risks are.  Great care is taken when placing the radio frequency needles, block needles or lesioning probes, but sometimes complications can occur. 1. Infection: Any time there is an injection through the skin, there is a risk of infection.  This is why sterile conditions are used for these blocks.  There are four possible types of infection. 1. Localized skin infection. 2. Central Nervous System Infection-This can be in the form of Meningitis, which can be deadly. 3. Epidural Infections-This can be in the form of an epidural abscess, which can cause pressure inside of the spine, causing compression of the spinal cord with subsequent paralysis. This would require an emergency surgery to decompress, and there are no guarantees that the patient would recover from the paralysis. 4. Discitis-This is an infection of the intervertebral discs.  It occurs in about 1% of discography procedures.  It is difficult to treat and it may lead to surgery.        2. Pain: the needles have to go through skin and soft tissues, will cause soreness.       3. Damage to internal structures:  The nerves to be lesioned may be near blood vessels or    other nerves which can be potentially damaged.       4. Bleeding: Bleeding is more common if the patient is taking blood thinners such as  aspirin, Coumadin, Ticiid, Plavix, etc., or if he/she have some genetic predisposition  such as hemophilia. Bleeding into the spinal canal can cause  compression of the spinal  cord with subsequent paralysis.  This would require an emergency surgery to  decompress and there are no guarantees that the patient would recover from the  paralysis.       5. Pneumothorax:  Puncturing of a lung is a possibility, every time a needle is introduced in  the area of the chest or upper back.  Pneumothorax refers to free air around the  collapsed lung(s), inside of the thoracic cavity (chest cavity).  Another two possible  complications related to a similar event would include: Hemothorax and Chylothorax.   These are variations of the Pneumothorax, where instead of air around the collapsed  lung(s), you may have blood or chyle, respectively.       6. Spinal headaches: They may occur with any procedures in the area of the spine.  7. Persistent CSF (Cerebro-Spinal Fluid) leakage: This is a rare problem, but may occur  with prolonged intrathecal or epidural catheters either due to the formation of a fistulous  track or a dural tear.       8. Nerve damage: By working so close to the spinal cord, there is always a possibility of  nerve damage, which could be as serious as a permanent spinal cord injury with  paralysis.       9. Death:  Although rare, severe deadly allergic reactions known as "Anaphylactic  reaction" can occur to any of the medications used.      10. Worsening of the symptoms:  We can always make thing worse.  What are the chances of something like this happening? Chances of any of this occuring are extremely low.  By statistics, you have more of a chance of getting killed in a motor vehicle accident: while driving to the hospital than any of the above occurring .  Nevertheless, you should be aware that they are possibilities.  In general, it is similar to taking a shower.  Everybody knows that you can slip, hit your head and get killed.  Does that mean that you should not shower again?  Nevertheless always keep in mind that statistics do not mean  anything if you happen to be on the wrong side of them.  Even if a procedure has a 1 (one) in a 1,000,000 (million) chance of going wrong, it you happen to be that one..Also, keep in mind that by statistics, you have more of a chance of having something go wrong when taking medications.  Who should not have this procedure? If you are on a blood thinning medication (e.g. Coumadin, Plavix, see list of "Blood Thinners"), or if you have an active infection going on, you should not have the procedure.  If you are taking any blood thinners, please inform your physician.  How should I prepare for this procedure?  Do not eat or drink anything at least six hours prior to the procedure.  Bring a driver with you .  It cannot be a taxi.  Come accompanied by an adult that can drive you back, and that is strong enough to help you if your legs get weak or numb from the local anesthetic.  Take all of your medicines the morning of the procedure with just enough water to swallow them.  If you have diabetes, make sure that you are scheduled to have your procedure done first thing in the morning, whenever possible.  If you have diabetes, take only half of your insulin dose and notify our nurse that you have done so as soon as you arrive at the clinic.  If you are diabetic, but only take blood sugar pills (oral hypoglycemic), then do not take them on the morning of your procedure.  You may take them after you have had the procedure.  Do not take aspirin or any aspirin-containing medications, at least eleven (11) days prior to the procedure.  They may prolong bleeding.  Wear loose fitting clothing that may be easy to take off and that you would not mind if it got stained with Betadine or blood.  Do not wear any jewelry or perfume  Remove any nail coloring.  It will interfere with some of our monitoring equipment.  NOTE: Remember that this is not meant to be interpreted as a complete list of all possible  complications.  Unforeseen problems may occur.  BLOOD THINNERS  The following drugs contain aspirin or other products, which can cause increased bleeding during surgery and should not be taken for 2 weeks prior to and 1 week after surgery.  If you should need take something for relief of minor pain, you may take acetaminophen which is found in Tylenol,m Datril, Anacin-3 and Panadol. It is not blood thinner. The products listed below are.  Do not take any of the products listed below in addition to any listed on your instruction sheet.  A.P.C or A.P.C with Codeine Codeine Phosphate Capsules #3 Ibuprofen Ridaura  ABC compound Congesprin Imuran rimadil  Advil Cope Indocin Robaxisal  Alka-Seltzer Effervescent Pain Reliever and Antacid Coricidin or Coricidin-D  Indomethacin Rufen  Alka-Seltzer plus Cold Medicine Cosprin Ketoprofen S-A-C Tablets  Anacin Analgesic Tablets or Capsules Coumadin Korlgesic Salflex  Anacin Extra Strength Analgesic tablets or capsules CP-2 Tablets Lanoril Salicylate  Anaprox Cuprimine Capsules Levenox Salocol  Anexsia-D Dalteparin Magan Salsalate  Anodynos Darvon compound Magnesium Salicylate Sine-off  Ansaid Dasin Capsules Magsal Sodium Salicylate  Anturane Depen Capsules Marnal Soma  APF Arthritis pain formula Dewitt's Pills Measurin Stanback  Argesic Dia-Gesic Meclofenamic Sulfinpyrazone  Arthritis Bayer Timed Release Aspirin Diclofenac Meclomen Sulindac  Arthritis pain formula Anacin Dicumarol Medipren Supac  Analgesic (Safety coated) Arthralgen Diffunasal Mefanamic Suprofen  Arthritis Strength Bufferin Dihydrocodeine Mepro Compound Suprol  Arthropan liquid Dopirydamole Methcarbomol with Aspirin Synalgos  ASA tablets/Enseals Disalcid Micrainin Tagament  Ascriptin Doan's Midol Talwin  Ascriptin A/D Dolene Mobidin Tanderil  Ascriptin Extra Strength Dolobid Moblgesic Ticlid  Ascriptin with Codeine Doloprin or Doloprin with Codeine Momentum Tolectin  Asperbuf Duoprin  Mono-gesic Trendar  Aspergum Duradyne Motrin or Motrin IB Triminicin  Aspirin plain, buffered or enteric coated Durasal Myochrisine Trigesic  Aspirin Suppositories Easprin Nalfon Trillsate  Aspirin with Codeine Ecotrin Regular or Extra Strength Naprosyn Uracel  Atromid-S Efficin Naproxen Ursinus  Auranofin Capsules Elmiron Neocylate Vanquish  Axotal Emagrin Norgesic Verin  Azathioprine Empirin or Empirin with Codeine Normiflo Vitamin E  Azolid Emprazil Nuprin Voltaren  Bayer Aspirin plain, buffered or children's or timed BC Tablets or powders Encaprin Orgaran Warfarin Sodium  Buff-a-Comp Enoxaparin Orudis Zorpin  Buff-a-Comp with Codeine Equegesic Os-Cal-Gesic   Buffaprin Excedrin plain, buffered or Extra Strength Oxalid   Bufferin Arthritis Strength Feldene Oxphenbutazone   Bufferin plain or Extra Strength Feldene Capsules Oxycodone with Aspirin   Bufferin with Codeine Fenoprofen Fenoprofen Pabalate or Pabalate-SF   Buffets II Flogesic Panagesic   Buffinol plain or Extra Strength Florinal or Florinal with Codeine Panwarfarin   Buf-Tabs Flurbiprofen Penicillamine   Butalbital Compound Four-way cold tablets Penicillin   Butazolidin Fragmin Pepto-Bismol   Carbenicillin Geminisyn Percodan   Carna Arthritis Reliever Geopen Persantine   Carprofen Gold's salt Persistin   Chloramphenicol Goody's Phenylbutazone   Chloromycetin Haltrain Piroxlcam   Clmetidine heparin Plaquenil   Cllnoril Hyco-pap Ponstel   Clofibrate Hydroxy chloroquine Propoxyphen         Before stopping any of these medications, be sure to consult the physician who ordered them.  Some, such as Coumadin (Warfarin) are ordered to prevent or treat serious conditions such as "deep thrombosis", "pumonary embolisms", and other heart problems.  The amount of time that you may need off of the medication may also vary with the medication and the reason for which you were taking it.  If you are taking any of these medications, please  make sure you notify your pain physician before you undergo any procedures.         Radiofrequency Lesioning Radiofrequency  lesioning is a procedure that is performed to relieve pain. The procedure is often used for back, neck, or arm pain. Radiofrequency lesioning involves the use of a machine that creates radio waves to make heat. During the procedure, the heat is applied to the nerve that carries the pain signal. The heat damages the nerve and interferes with the pain signal. Pain relief usually starts about 2 weeks after the procedure and lasts for 6 months to 1 year. Tell a health care provider about:  Any allergies you have.  All medicines you are taking, including vitamins, herbs, eye drops, creams, and over-the-counter medicines.  Any problems you or family members have had with anesthetic medicines.  Any blood disorders you have.  Any surgeries you have had.  Any medical conditions you have.  Whether you are pregnant or may be pregnant. What are the risks? Generally, this is a safe procedure. However, problems may occur, including:  Pain or soreness at the injection site.  Infection at the injection site.  Damage to nerves or blood vessels. What happens before the procedure?  Ask your health care provider about:  Changing or stopping your regular medicines. This is especially important if you are taking diabetes medicines or blood thinners.  Taking medicines such as aspirin and ibuprofen. These medicines can thin your blood. Do not take these medicines before your procedure if your health care provider instructs you not to.  Follow instructions from your health care provider about eating or drinking restrictions.  Plan to have someone take you home after the procedure.  If you go home right after the procedure, plan to have someone with you for 24 hours. What happens during the procedure?  You will be given one or more of the following:  A medicine to help  you relax (sedative).  A medicine to numb the area (local anesthetic).  You will be awake during the procedure. You will need to be able to talk with the health care provider during the procedure.  With the help of a type of X-ray (fluoroscopy), the health care provider will insert a radiofrequency needle into the area to be treated.  Next, a wire that carries the radio waves (electrode) will be put through the radiofrequency needle. An electrical pulse will be sent through the electrode to verify the correct nerve. You will feel a tingling sensation, and you may have muscle twitching.  Then, the tissue that is around the needle tip will be heated by an electric current that is passed using the radiofrequency machine. This will numb the nerves.  A bandage (dressing) will be put on the insertion area after the procedure is done. The procedure may vary among health care providers and hospitals. What happens after the procedure?  Your blood pressure, heart rate, breathing rate, and blood oxygen level will be monitored often until the medicines you were given have worn off.  Return to your normal activities as directed by your health care provider. This information is not intended to replace advice given to you by your health care provider. Make sure you discuss any questions you have with your health care provider. Document Released: 04/07/2011 Document Revised: 01/15/2016 Document Reviewed: 09/16/2014 Elsevier Interactive Patient Education  2017 [employer].

## 2016-12-14 ENCOUNTER — Telehealth: Payer: Self-pay | Admitting: *Deleted

## 2016-12-14 ENCOUNTER — Other Ambulatory Visit: Payer: Self-pay | Admitting: *Deleted

## 2016-12-14 DIAGNOSIS — I10 Essential (primary) hypertension: Secondary | ICD-10-CM | POA: Diagnosis not present

## 2016-12-14 DIAGNOSIS — I4892 Unspecified atrial flutter: Secondary | ICD-10-CM | POA: Diagnosis not present

## 2016-12-14 DIAGNOSIS — I495 Sick sinus syndrome: Secondary | ICD-10-CM | POA: Diagnosis not present

## 2016-12-14 DIAGNOSIS — I4891 Unspecified atrial fibrillation: Secondary | ICD-10-CM | POA: Diagnosis not present

## 2016-12-14 DIAGNOSIS — Z8679 Personal history of other diseases of the circulatory system: Secondary | ICD-10-CM | POA: Diagnosis not present

## 2016-12-14 DIAGNOSIS — J449 Chronic obstructive pulmonary disease, unspecified: Secondary | ICD-10-CM | POA: Diagnosis not present

## 2016-12-14 DIAGNOSIS — R531 Weakness: Secondary | ICD-10-CM | POA: Diagnosis not present

## 2016-12-14 DIAGNOSIS — I5032 Chronic diastolic (congestive) heart failure: Secondary | ICD-10-CM | POA: Diagnosis not present

## 2016-12-14 NOTE — Telephone Encounter (Signed)
States doing fine. Denies problems post procedure.

## 2016-12-14 NOTE — Patient Outreach (Addendum)
[facility]) Care Management  12/14/2016  Kristin [name] 1930-04-20 [id number]    Late entry for 12/13/16 Care Coordination [facility] CM received a call from Kristin [name] on 12/13/16 at 0914 to update CM that her swelling had decreased "some" over the weekend with increase of Lasix to 40 mg and she has scheduled an appointment to see her Cardiologist, Isaias Cowman, on 12/14/16 at 2 pm. [place] CM discussed the labs (potassium) noted in an in basket message from her pcp's NP to [facility] CM.   Kristin Gathright to discuss this with her Cardiologist   Plans: [facility] CM will follow up with Kristin Blucher to discuss further treatment plan offered by her Cardiologist  On 12/14/16 Dr Sharlet Salina notes not available to [facility] CM on EPIC but CM noted a scheduled 12/20/16 1000 CHF  clinic at [facility] appointment with [name], NP  [facility] routed note to pcp office   [facility] home visit to see Kristin Vaquerano scheduled for 12/21/16    Joelene Millin L. Lavina Hamman, RN, BSN, [place] Care Management [phone]

## 2016-12-15 ENCOUNTER — Other Ambulatory Visit: Payer: Self-pay | Admitting: Pharmacist

## 2016-12-15 DIAGNOSIS — Z8679 Personal history of other diseases of the circulatory system: Secondary | ICD-10-CM | POA: Diagnosis not present

## 2016-12-15 DIAGNOSIS — J449 Chronic obstructive pulmonary disease, unspecified: Secondary | ICD-10-CM | POA: Diagnosis not present

## 2016-12-15 DIAGNOSIS — R531 Weakness: Secondary | ICD-10-CM | POA: Diagnosis not present

## 2016-12-15 DIAGNOSIS — I4891 Unspecified atrial fibrillation: Secondary | ICD-10-CM | POA: Diagnosis not present

## 2016-12-15 DIAGNOSIS — I10 Essential (primary) hypertension: Secondary | ICD-10-CM | POA: Diagnosis not present

## 2016-12-15 NOTE — Patient Outreach (Signed)
[facility]) Care Management  12/15/2016  MAKEYA HILGERT Dec 26, 1929 [id number]  Patient was referred to [place] by [place] for medication reconciliation and patient cost concerns with her medications.   Successful phone outreach to patient, HIPAA details verified, explained purpose of call to patient.   Patient reports she "took care of" cost issues with Eliquis.  She did not wish to go into details other than to state she has Eliquis and denies affordability concern with it.  When asked about other medications, patient states other medications are affordable and she denies cost concerns.   Patient did not wish to review medications over the phone at time of call due to being busy, but stated [facility] Pharmacist could call back at a later date/time.   Plan:  Will make a phone outreach attempt in the next week to attempt to complete medication review with patient.   Karrie Meres, PharmD, [place] [phone]

## 2016-12-17 ENCOUNTER — Ambulatory Visit: Payer: Self-pay | Admitting: Pharmacist

## 2016-12-20 ENCOUNTER — Ambulatory Visit: Payer: Medicare Other | Admitting: Family

## 2016-12-21 ENCOUNTER — Telehealth: Payer: Self-pay | Admitting: Nurse Practitioner

## 2016-12-21 ENCOUNTER — Other Ambulatory Visit: Payer: Self-pay | Admitting: *Deleted

## 2016-12-21 NOTE — Telephone Encounter (Signed)
Returned phone call to [employer].  [name] is having swelling only at top of left foot and the side of her ankle is red and tender.  Causing her lots of stabbing pain.  Takes pain medication for chronic pain management at night and pain is better.  This is different from her swelling consistent with CHF because her right leg swelling is significantly improved and "almost normal." The left ankle is not significantly swollen - only the top of foot.  Pt has had gout in her feet in prior years, but notes that this pain is different from her former gout pain.  Recommended office visit to evaluate.  Can wait until 5/8 on same day as Dr. Saralyn Pilar or sooner if patient desires.  Advised granddaughter that we should defer management of CHF fluid status to Dr.Paraschos with upcoming echo and office visit, but to evaluate this new pain as a potentially separate problem.

## 2016-12-21 NOTE — Telephone Encounter (Signed)
Pt granddaughter called states that pt foot (left) is still swollen   Red and tender to touch (pain was so bad this weekend pt wanted to go to the ER) granddaughters states that right foot swollen have gone down. [name] call back  # is [phone]

## 2016-12-22 ENCOUNTER — Ambulatory Visit: Payer: Self-pay | Admitting: Pharmacist

## 2016-12-23 ENCOUNTER — Ambulatory Visit (INDEPENDENT_AMBULATORY_CARE_PROVIDER_SITE_OTHER): Payer: Medicare Other | Admitting: Nurse Practitioner

## 2016-12-23 ENCOUNTER — Encounter: Payer: Self-pay | Admitting: Nurse Practitioner

## 2016-12-23 VITALS — BP 107/62 | HR 78 | Temp 98.9°F | Resp 16 | Ht 69.0 in | Wt 162.0 lb

## 2016-12-23 DIAGNOSIS — I5032 Chronic diastolic (congestive) heart failure: Secondary | ICD-10-CM | POA: Diagnosis not present

## 2016-12-23 DIAGNOSIS — M109 Gout, unspecified: Secondary | ICD-10-CM | POA: Diagnosis not present

## 2016-12-23 MED ORDER — COLCHICINE 0.6 MG PO CAPS: ORAL_CAPSULE | ORAL | 0 refills | Status: DC

## 2016-12-23 NOTE — Progress Notes (Signed)
Subjective:    [name], female    DOB: 06-04-1930, 83 y.o.   MRN: [id number]  Kristin [name] is a 83 y.o. female presenting on 12/23/2016 for Foot Swelling   HPI Heart failure Overall improvement with significantly improved fluid status.  Patient weight now 162 lbs and back to her pre-op weight from early April 2018.  After increasing lasix to 40 mg daily, leg swelling reduced bilaterally.  She denies any abdominal swelling, right leg swelling, difficulty breathing.  She actually reports feeling well and has increased appetite and activity tolerance.  She notes she has started driving again in the area where she lives.  They have not received the message to have labs drawn to check kidney function.  They are going to see [name] at [facility] tomorrow for further heart failure management.  Dr. Saralyn Pilar, her Cardiologist has also ordered a repeat echocardiogram.  L Foot/Leg Swelling and Pain First symptoms of L leg swelling were about 1.5-2 weeks ago.  Pt had "really bad ankle and foot pain" on Saturday and considered going to the emergency room.    She took her regular oxycodone and it helped some.  She notes her home health nurse told her it might be gout.  She has pain and swelling in her left great toe with swelling worse to ankle and some extension up to her left knee.   Social History  Substance Use Topics  . Smoking status: Former Smoker    Packs/day: 1.25    Years: 40.00    Types: Cigarettes    Quit date: 12/16/1998  . Smokeless tobacco: Never Used  . Alcohol use No    Review of Systems Per HPI unless specifically indicated above     Objective:    BP 107/62 (BP Location: Right Arm, Patient Position: Sitting, Cuff Size: Large)   Pulse 78   Temp 98.9 F (37.2 C) (Oral)   Resp 16   Ht '5\' 9"'$  (1.753 m)   Wt 162 lb (73.5 kg)   SpO2 97%   BMI 23.92 kg/m   Wt Readings from Last 3 Encounters:  12/23/16 162 lb (73.5 kg)  12/13/16 168 lb (76.2 kg)    11/26/16 183 lb (83 kg)    Physical Exam  Constitutional: She is oriented to person, place, and time. She appears well-developed and well-nourished. No distress.  HENT:  Head: Normocephalic and atraumatic.  Eyes: Conjunctivae are normal. Pupils are equal, round, and reactive to light.  Neck: Normal range of motion. No JVD present. No tracheal deviation present.  Cardiovascular: Normal rate, regular rhythm, normal heart sounds and intact distal pulses.   Pulmonary/Chest: Effort normal and breath sounds normal. No respiratory distress. She has no wheezes. She has no rales.  Musculoskeletal:       Right lower leg: Normal. She exhibits no edema.       Left lower leg: She exhibits swelling.       Right foot: Normal.       Left foot: There is bony tenderness and swelling. There is normal range of motion, normal capillary refill, no crepitus, no deformity and no laceration.       Feet:  Lymphadenopathy:    She has no cervical adenopathy.  Neurological: She is alert and oriented to person, place, and time.  Skin: Skin is warm and dry.       Assessment & Plan:   Problem List Items Addressed This Visit  Cardiovascular and Mediastinum   Chronic diastolic CHF (congestive heart failure) (HCC) Stable and improving symptoms.  Pt with unilateral LE edema.  Evaluate other causes.  Plan: 1. Continue plans for repeat echocardiogram and appointments with [name], NP and Dr. Saralyn Pilar. 2. Obtain BMP at your appointment tomorrow. Order written     Other   Gout - Primary (Chronic) Acute flare.  Use colchicine for as short a duration as possible r/t CKD Stage III and recent AKI with hospitalization in April 2018.  Plan: 1. Take colchicine 2 capsules today then repeat with 1 capsule in 1 hour.  May repeat this dosing with additional gout flares.  Avoid prophylactic treatment if possible r/t CKD stage III. 2. Obtain BMP tomorrow at appointment with [name], NP    Relevant  Medications   Colchicine 0.6 MG CAPS   Other Relevant Orders   Basic Metabolic Panel (BMET)      Meds ordered this encounter  Medications  . Colchicine 0.6 MG CAPS    Sig: Take 2 pills followed by 1 pill in 1 hour at the start of your gout flare.    Dispense:  9 capsule    Refill:  0    Order Specific Question:   Supervising Provider    Answer:   Olin Hauser [[id number]]      Follow up plan: Return in about 2 months (around 02/22/2017).  [name], DNP, AGPCNP-BC Adult Gerontology Primary Care Nurse Practitioner [facility] 12/23/2016, 3:10 PM

## 2016-12-23 NOTE — Patient Outreach (Signed)
[facility]) Care Management   12/23/2016  Kristin [name] [birth year] [id number]  Kristin [name] is an 83 y.o. female with PMH of Chronic diastolic heart failure, COPD, HTN, Chronic kidney disease (CKD), stage III (moderate), s/p pacemaker, CAFL (chronic airflow limitation), Complete rotator cuff rupture of shoulder (Left); DDD (degenerative disc disease), lumbosacral; Gout; H/O cardiac catheterization; Arrhythmia, sinus node (HCC, Lumbar canal stenosis; Urinary frequency/UTI, Seasonal allergies; Breast cancer of right breast, Chronic pain syndrome; Chronic low back pain, Restless leg syndrome, Neurogenic pain; Long term current use of opiate analgesic; Chronic Opioid-induced constipation, Vitamin D insufficiency, 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 S/p laparoscopic cholecystectomy on 11/20/2016 and ERCP for stone removal, biliary sphincterotomy after an admission on 11/16/2016 to [facility] with acute cholecystitis complicated by choledocholithiasis and E.coli bacteremia. When she was discharged on 11/22/16, her home lasix and lisinopril were held due to mild AKI and hypotension during hospitalization.   Kristin [name] was referred to [place] on 12/01/16 for complex case management (for recent hospital discharge, post surgery, hx heart failure-pacemaker; having hypotension that requires managing medication; patient is falls risk) after contacted by [facility] telephonic CM, L Manning.  Kristin [name] states her grand daughter manages her medications and takes her blood pressure twice daily and helps her with personal care. States son &grand daughter assist with meals &take her to MD appointments. She is seen by [facility] home care PT ("mel") only now No longer being seen by OT Timmothy Sours)- Mel is schedule dot visit her on today after 3 pm She has had a  CV follow up appointment on 4//24/18 at 2 pm with Dr Josefa Half, Cv that went well per Kristin [name]. Kristin [name] confirms she is scheduled for an EKG on 12/29/16  And is schedule dot return to see thee CV "one week later" On 12/27/16 she returns to her surgeon to check her incision site that has healed and decreased in soreness and is now soft vs hard to touch. He CHF clinic appt for 12/20/16 was cancelled due to staff member out sick but will be rescheduled. THN CM discussed the purpose of this clinic to monitor her CHF symptoms in comparison to visit to a coumadin clinic to monitor INR values so pt could understand the importance of attendance to CHF clinic. Pt voiced understanding.  Subjective: "I'm doing better except my one foot is a little swollen and painful but not like it was before"    Objective:   BP 110/68   Pulse 71   Temp 98.1 F (36.7 C) (Oral)   SpO2 95%   Initially THN CM got 106/70 manually and inquired about episodes of low BP so THN Cm took her BP with her BP machine (since she had not yet completed to record in her log for her [facility] therapists. She denied dizziness episodes,  THN CM discussed hypotension On her log she was noted to be 117/80 p 78 on 12/20/16 Review of Systems  Constitutional: Positive for malaise/fatigue and weight loss. Negative for chills, diaphoresis and fever.  HENT: Positive for hearing loss. Negative for congestion, ear discharge, ear pain, nosebleeds, sinus pain, sore throat and tinnitus.   Eyes: Negative for blurred vision, double vision, photophobia, pain, discharge and redness.  Respiratory: Negative for cough, hemoptysis, sputum production, shortness of breath, wheezing and stridor.   Cardiovascular: Positive for leg swelling. Negative for chest  pain, palpitations, orthopnea, claudication and PND.  Gastrointestinal: Negative for abdominal pain, blood in stool, constipation, diarrhea, heartburn, melena, nausea and vomiting.  Genitourinary: Negative for dysuria,  frequency, hematuria and urgency.  Musculoskeletal: Positive for myalgias. Negative for back pain, falls, joint pain and neck pain.  Skin: Negative.  Negative for itching and rash.  Neurological: Negative.  Negative for dizziness, tingling, tremors, sensory change, speech change, focal weakness, seizures, loss of consciousness, weakness and headaches.  Endo/Heme/Allergies: Negative for environmental allergies and polydipsia. Does not bruise/bleed easily.  Psychiatric/Behavioral: Negative for depression, hallucinations, memory loss, substance abuse and suicidal ideas. The patient is not nervous/anxious and does not have insomnia.     Physical Exam  Encounter Medications:   Outpatient Encounter Prescriptions as of 12/21/2016  Medication Sig Note  . albuterol (PROVENTIL) (2.5 MG/3ML) 0.083% nebulizer solution Take 3 mLs (2.5 mg total) by nebulization every 8 (eight) hours as needed. Shortness of breath and wheezing   . apixaban (ELIQUIS) 5 MG TABS tablet Take 1 tablet (5 mg total) by mouth 2 (two) times daily.   Marland Kitchen aspirin EC 325 MG tablet Take 325 mg by mouth daily.    . bisacodyl (DULCOLAX) 5 MG EC tablet Take 2 tablets (10 mg total) by mouth daily as needed for moderate constipation.   . Cholecalciferol (VITAMIN D) 2000 units tablet Take 2,000 Units by mouth daily.    . furosemide (LASIX) 40 MG tablet Take 1 tablet (40 mg total) by mouth daily.   Marland Kitchen HYDROcodone-acetaminophen (NORCO/VICODIN) 5-325 MG tablet Take 1 tablet by mouth every 8 (eight) hours as needed for moderate pain.   Marland Kitchen levocetirizine (XYZAL) 5 MG tablet Take 1 tablet (5 mg total) by mouth every evening.   Marland Kitchen lisinopril (PRINIVIL,ZESTRIL) 10 MG tablet TAKE 1 TABLET BY MOUTH ONCE DAILY. 12/07/2016: Restarted taking lisinopril on 11/27/16 after pcp visit on 11/26/16 per MD order   . omeprazole (PRILOSEC) 20 MG capsule    . [START ON 02/11/2017] Oxycodone HCl 20 MG TABS Take 1 tablet (20 mg total) by mouth every 8 (eight) hours as needed.  11/11/2016: DO NOT DELETE, even if Expired!!! See Dr. Adalberto Cole care coordination note.  . Oxycodone HCl 20 MG TABS Take 1 tablet (20 mg total) by mouth every 8 (eight) hours as needed. 11/11/2016: DO NOT DELETE, even if Expired!!! See Dr. Adalberto Cole care coordination note.  Derrill Memo ON 01/12/2017] Oxycodone HCl 20 MG TABS Take 1 tablet (20 mg total) by mouth every 8 (eight) hours as needed. 11/11/2016: DO NOT DELETE, even if Expired!!! See Dr. Adalberto Cole care coordination note.  . OXYGEN Inhale 2 Doses into the lungs at bedtime.   . polyethylene glycol (MIRALAX / GLYCOLAX) packet Take 17 g by mouth daily as needed. 12/07/2016: Finished on 12/06/16 - box empty  . rOPINIRole (REQUIP) 2 MG tablet Take 1 tablet (2 mg total) by mouth at bedtime.   . senna-docusate (SENOKOT-S) 8.6-50 MG tablet Take 2 tablets by mouth 2 (two) times daily.    No facility-administered encounter medications on file as of 12/21/2016.     Functional Status:   In your present state of health, do you have any difficulty performing the following activities: 12/07/2016 11/30/2016  Hearing? Y Y  Vision? - N  Difficulty concentrating or making decisions? - N  Walking or climbing stairs? - Y  Dressing or bathing? - Y  Doing errands, shopping? - Y  Some recent data might be hidden    Fall/Depression Screening:    Fall Risk  12/21/2016 12/13/2016 11/30/2016  Falls in the past year? No No No  Risk for fall due to : Impaired balance/gait;Impaired mobility;Medication side effect;Impaired vision - -   PHQ 2/9 Scores 12/21/2016 12/13/2016 11/30/2016 11/11/2016 09/14/2016 07/28/2016 07/21/2016  PHQ - 2 Score 0 0 0 0 0 0 0  Exception Documentation - - - - - - -    Assessment:    Upon THN CM arrival to visit Kristin [name] she was greeted by Kristin [name] to her back door using a cane.    She confirmed her name, dob, today's date/time, her address and her united healthcare medicare coverage with CM. THN CM reviewed THN information packet and Kristin [name] signed  her [facility] consent form (her copies placed in [facility] information package). She confirms her granddaughter, Kristin [name] and her son, Kristin [name] may be contacted by [facility] staff as needed. She has a rolling walker but when ambulating is using her cane which she states she prefers. DME in the home includes a bp cuff, oxygen, nebulizer, cane, rolling walker and her. THN CM mentioned her bathroom in conversation and she gave CM a tour of it to include a walk in shower with bench, grab bars and shower head (assisted by [facility] [place].   Acute Medical Conditions (? Gout symptoms of right great toe) Kristin [name] is managing her CHF and swelling with  lasix 40 mg as continued by her CV  She was not noted to be sniffing and coughing nonproductively this visit.   No Pitting Edema noted today Denies sob Kristin [name] was again encouraged to weigh daily after educating her again on the CHF yellow and red zones that includes a daily weight gain of 3 lbs and a weekly gain of 5 lbs is reportable to her doctor. The last weight she recalls lately was 168 lbs with a ht of 69 inches.which was calculated with a BMI of 25.5 overweight THN CM educated Kristin [name] about BMI calculations, and he BMI being overweight.  She ws show the BMI scale and her normal BMI value of 151 to be not overweight per her request. Discussed loss of 17 lbs would = 21-22 BMI for her  THN CM discussed in details Gout symptoms and asked pt if she had the symptoms as reviewed. Kristin [name] agreed she had each symptom reviewed.  Right great toe is swollen and tender to touch.  Kristin [name] could not initially recall use of gout medications but at end of visit stated she recalls use of Allopurinol but does not have any at home. THN CM referred her to her pcp for assistance with medication to relieve gout pain.     Chronic Medical Conditions (COPD, HTN): Kristin [name] manages her COPD and HTN with medications (to include nebulizer treatments and  oxygen at 2 L [place] at night -both DME provided by [name]), a low sodium diet and frequent BP monitoring (especially for hypotension episodes of 100/60 or below).  The last noted flu shot was noted on 06/22/16 and last pneumovax noted in chart on 08/23/2008. Kristin. Frede was not aware of her type of COPD. CM discussed bronchitis, emphysema, normal oxygen saturation values and taught her how to use her incentive spirometry she had not opened.  Last BM reported on 12/21/16  DME cost; Kristin [name] voiced concern about the cost of her nebulizer and oxygen being obtained from [place].  She is not aware if [name] is in or out of network with [place] healthcare coverage.  CM discussed in and out of network coverage.   Patient goals: Kristin Weisenburger met her immediate goal # 1) to decrease the swelling in her lower extremities from last home visit and has been contacted by [place] to see about possible decreasing the costs of her medications and DME  Plan: [facility] CM educated Kristin Karwowski on there [place] when she informed CM she was only watching her sodium for her diet. CM left her with a list of [place] resources. THN CM left Kristin [name] the written copy of the list of 10 symptoms of Gout on her table after CM reviewed therm with her The THN 2018 calendar,has not been used as evidence by Pt stating initially she did not recall [facility] CM home visit.  THN CM picked up calendar on the living room table were [facility] CM left it weeks ago to show the [facility] CM appt written for today. When [facility] Cm was leaving her home, Kristin [name] had CM to write the next [facility] CM appt on her calendar on the wall near her back door.     THN CM routed [facility] CM notes to pcp and Cv providers    [facility] CM Care Plan Problem One     Most Recent Value  Care Plan Problem One  Knowledge deficit of congestive heart failure home management (signs/symptoms, treatment interventions  Role Documenting the Problem One  Care Management Coordinator  Care Plan for  Problem One  Active  THN Long Term Goal (31-90 days)  Over the next 45 days the patient will be able to verbalize knowledge  and demonstrate home management interventions for CHF  THN Long Term Goal Start Date  12/07/16  [facility] Long Term Goal Met Date  12/21/16  Interventions for Problem One Long Term Goal  Assess and discuss CHF diagnosis, sympotoms/zone symptoms to report to doctor, diet, activity, medications , weights Provided  written materials, monitor for symptoms like sob weight gain & edema  THN CM Short Term Goal #1 (0-30 days)  over the next 7 days the patient will take ordered diuretics to decrease edema  THN CM Short Term Goal #1 Start Date  12/07/16  [facility] CM Short Term Goal #1 Met Date  12/21/16  Interventions for Short Term Goal #1  Assess zone CHF symptoms, examined feet, lungs, medications, called pcp with noted CHF symptoms, Order for increase in lasix obtained Educated pt on zone symptoms, medications (how to take new ordered medications) and when to call pcp Provieded written CHF materials   THN CM Short Term Goal #2 (0-30 days)  Over the next 14-21 days the patient will be able to knowledge about CHF home treatment to include verbalizing zone symptoms she will call into her doctor, daily weights, diet, activity and understanding of use of CHF medications  THN CM Short Term Goal #2 Start Date  12/07/16  [facility] CM Short Term Goal #2 Met Date  12/21/16  Interventions for Short Term Goal #2  assess and education on CHF home treatment using CHF booklet/handouts/Use teach back method to evaluate knowledge of CHF home care needs     [facility] CM Care Plan Problem Two     Most Recent Value  Care Plan Problem Two   Possible side effect of Lisinopril or CHF yellow zone coughing symptom assistance needed plus  increase cost in medications reported (referral to [facility] pharmacist)   Role Documenting the Problem Two  Care Management [place] for Problem Two  Active  Interventions for Problem  Two Long Term Goal   Discussed lisinopril side effect with pcp, NP and patient, [facility] pharmacy referral after permission given  THN Long Term Goal Start Date  12/07/16  THN CM Short Term Goal #1 (0-30 days)  Over the next 30 days the patient will receive [place] referral for asisstance with Lisinopril use and possible assistance with costs of medications  THN CM Short Term Goal #1 Start Date  12/07/16  Interventions for Short Term Goal #2   [place] referral Discussed CHF symptoms related to medicines    [facility] CM Care Plan Problem Three     Most Recent Value  Care Plan Problem Three  Knowledge of gout flare up management   Role Documenting the Problem Three  Care Management Coordinator  Care Plan for Problem Three  Active  THN Long Term Goal (31-90) days  over the next 31 days gout flare up will be resolved  THN Long Term Goal Start Date  12/21/16  Interventions for Problem Three Long Term Goal  review gout symptoms, encourage call to pcp, assessed painful gout site Provided written symptoms to monitor for   [facility] CM Short Term Goal #1 (0-30 days)  over the next 14 day patient will make contact with pcp to request treatment plan for gout management/resolution  [facility] CM Short Term Goal #1 Start Date  12/21/16  Interventions for Short Term Goal #1  review gout symptoms, encourage call to pcp, assessed painful gout site Provided written symptoms to monitor for       Saphyre Cillo L. Lavina Hamman, RN, BSN, [place] Care Management [phone]

## 2016-12-23 NOTE — Patient Instructions (Signed)
Kristin [name], Thank you for coming in to clinic today.  1. You likely have a gout flare in your left foot. - START colchicine 0.6 mg tablet.  Take 2 pills as soon as you pick up your prescription.  Take 1 pill 1 hour later then stop.   - Any future gout flare, you can repeat this dosing with 2 pills and 1 pill 1 hour later.    Please keep your follow-up appointment with Inis Sizer on July 26th.  If you have any other questions or concerns, please feel free to call the clinic or send a message through [facility]. You may also schedule an earlier appointment if necessary.  [name], DNP, AGNP-BC Adult Gerontology Nurse Practitioner [facility], [facility]    Gout Gout is painful swelling that can occur in some of your joints. Gout is a type of arthritis. This condition is caused by having too much uric acid in your body. Uric acid is a chemical that forms when your body breaks down substances called purines. Purines are important for building body proteins. When your body has too much uric acid, sharp crystals can form and build up inside your joints. This causes pain and swelling. Gout attacks can happen quickly and be very painful (acute gout). Over time, the attacks can affect more joints and become more frequent (chronic gout). Gout can also cause uric acid to build up under your skin and inside your kidneys. What are the causes? This condition is caused by too much uric acid in your blood. This can occur because:  Your kidneys do not remove enough uric acid from your blood. This is the most common cause.  Your body makes too much uric acid. This can occur with some cancers and cancer treatments. It can also occur if your body is breaking down too many red blood cells (hemolytic anemia).  You eat too many foods that are high in purines. These foods include organ meats and some seafood. Alcohol, especially beer, is also high in purines. A gout attack may be triggered  by trauma or stress. What increases the risk? This condition is more likely to develop in people who:  Have a family history of gout.  Are female and middle-aged.  Are female and have gone through menopause.  Are obese.  Frequently drink alcohol, especially beer.  Are dehydrated.  Lose weight too quickly.  Have an organ transplant.  Have lead poisoning.  Take certain medicines, including aspirin, cyclosporine, diuretics, levodopa, and niacin.  Have kidney disease or psoriasis. What are the signs or symptoms? An attack of acute gout happens quickly. It usually occurs in just one joint. The most common place is the big toe. Attacks often start at night. Other joints that may be affected include joints of the feet, ankle, knee, fingers, wrist, or elbow. Symptoms may include:  Severe pain.  Warmth.  Swelling.  Stiffness.  Tenderness. The affected joint may be very painful to touch.  Shiny, red, or purple skin.  Chills and fever. Chronic gout may cause symptoms more frequently. More joints may be involved. You may also have white or yellow lumps (tophi) on your hands or feet or in other areas near your joints. How is this diagnosed? This condition is diagnosed based on your symptoms, medical history, and physical exam. You may have tests, such as:  Blood tests to measure uric acid levels.  Removal of joint fluid with a needle (aspiration) to look for uric acid crystals.  X-rays to look for joint damage. How is this treated? Treatment for this condition has two phases: treating an acute attack and preventing future attacks. Acute gout treatment may include medicines to reduce pain and swelling, including:  NSAIDs.  Steroids. These are strong anti-inflammatory medicines that can be taken by mouth (orally) or injected into a joint.  Colchicine. This medicine relieves pain and swelling when it is taken soon after an attack. It can be given orally or through an IV  tube. Preventive treatment may include:  Daily use of smaller doses of NSAIDs or colchicine.  Use of a medicine that reduces uric acid levels in your blood.  Changes to your diet. You may need to see a specialist about healthy eating (dietitian). Follow these instructions at home: During a Gout Attack   If directed, apply ice to the affected area:  Put ice in a plastic bag.  Place a towel between your skin and the bag.  Leave the ice on for 20 minutes, 2-3 times a day.  Rest the joint as much as possible. If the affected joint is in your leg, you may be given crutches to use.  Raise (elevate) the affected joint above the level of your heart as often as possible.  Drink enough fluids to keep your urine clear or pale yellow.  Take over-the-counter and prescription medicines only as told by your health care provider.  Do not drive or operate heavy machinery while taking prescription pain medicine.  Follow instructions from your health care provider about eating or drinking restrictions.  Return to your normal activities as told by your health care provider. Ask your health care provider what activities are safe for you. Avoiding Future Gout Attacks   Follow a low-purine diet as told by your dietitian or health care provider. Avoid foods and drinks that are high in purines, including liver, kidney, anchovies, asparagus, herring, mushrooms, mussels, and beer.  Limit alcohol intake to no more than 1 drink a day for nonpregnant women and 2 drinks a day for men. One drink equals 12 oz of beer, 5 oz of wine, or 1 oz of hard liquor.  Maintain a healthy weight or lose weight if you are overweight. If you want to lose weight, talk with your health care provider. It is important that you do not lose weight too quickly.  Start or maintain an exercise program as told by your health care provider.  Drink enough fluids to keep your urine clear or pale yellow.  Take over-the-counter and  prescription medicines only as told by your health care provider.  Keep all follow-up visits as told by your health care provider. This is important. Contact a health care provider if:  You have another gout attack.  You continue to have symptoms of a gout attack after10 days of treatment.  You have side effects from your medicines.  You have chills or a fever.  You have burning pain when you urinate.  You have pain in your lower back or belly. Get help right away if:  You have severe or uncontrolled pain.  You cannot urinate. This information is not intended to replace advice given to you by your health care provider. Make sure you discuss any questions you have with your health care provider. Document Released: 08/06/2000 Document Revised: 01/15/2016 Document Reviewed: 05/22/2015 Elsevier Interactive Patient Education  2017 [employer].

## 2016-12-24 ENCOUNTER — Encounter: Payer: Self-pay | Admitting: Family

## 2016-12-24 ENCOUNTER — Ambulatory Visit: Payer: Medicare Other | Attending: Family | Admitting: Family

## 2016-12-24 VITALS — BP 125/73 | HR 89 | Resp 20 | Ht 68.0 in | Wt 161.5 lb

## 2016-12-24 DIAGNOSIS — Z9049 Acquired absence of other specified parts of digestive tract: Secondary | ICD-10-CM | POA: Diagnosis not present

## 2016-12-24 DIAGNOSIS — Z8619 Personal history of other infectious and parasitic diseases: Secondary | ICD-10-CM | POA: Diagnosis not present

## 2016-12-24 DIAGNOSIS — Z950 Presence of cardiac pacemaker: Secondary | ICD-10-CM | POA: Diagnosis not present

## 2016-12-24 DIAGNOSIS — Z823 Family history of stroke: Secondary | ICD-10-CM | POA: Insufficient documentation

## 2016-12-24 DIAGNOSIS — I10 Essential (primary) hypertension: Secondary | ICD-10-CM

## 2016-12-24 DIAGNOSIS — J449 Chronic obstructive pulmonary disease, unspecified: Secondary | ICD-10-CM | POA: Diagnosis not present

## 2016-12-24 DIAGNOSIS — Z9841 Cataract extraction status, right eye: Secondary | ICD-10-CM | POA: Insufficient documentation

## 2016-12-24 DIAGNOSIS — Z881 Allergy status to other antibiotic agents status: Secondary | ICD-10-CM | POA: Diagnosis not present

## 2016-12-24 DIAGNOSIS — Z9842 Cataract extraction status, left eye: Secondary | ICD-10-CM | POA: Insufficient documentation

## 2016-12-24 DIAGNOSIS — M545 Low back pain: Secondary | ICD-10-CM | POA: Diagnosis not present

## 2016-12-24 DIAGNOSIS — G8929 Other chronic pain: Secondary | ICD-10-CM | POA: Insufficient documentation

## 2016-12-24 DIAGNOSIS — I4891 Unspecified atrial fibrillation: Secondary | ICD-10-CM | POA: Diagnosis not present

## 2016-12-24 DIAGNOSIS — Z90710 Acquired absence of both cervix and uterus: Secondary | ICD-10-CM | POA: Diagnosis not present

## 2016-12-24 DIAGNOSIS — Z87440 Personal history of urinary (tract) infections: Secondary | ICD-10-CM | POA: Insufficient documentation

## 2016-12-24 DIAGNOSIS — M1990 Unspecified osteoarthritis, unspecified site: Secondary | ICD-10-CM | POA: Insufficient documentation

## 2016-12-24 DIAGNOSIS — Z7982 Long term (current) use of aspirin: Secondary | ICD-10-CM | POA: Insufficient documentation

## 2016-12-24 DIAGNOSIS — Z803 Family history of malignant neoplasm of breast: Secondary | ICD-10-CM | POA: Insufficient documentation

## 2016-12-24 DIAGNOSIS — Z853 Personal history of malignant neoplasm of breast: Secondary | ICD-10-CM | POA: Insufficient documentation

## 2016-12-24 DIAGNOSIS — M109 Gout, unspecified: Secondary | ICD-10-CM | POA: Diagnosis not present

## 2016-12-24 DIAGNOSIS — Z9889 Other specified postprocedural states: Secondary | ICD-10-CM | POA: Insufficient documentation

## 2016-12-24 DIAGNOSIS — Z888 Allergy status to other drugs, medicaments and biological substances status: Secondary | ICD-10-CM | POA: Diagnosis not present

## 2016-12-24 DIAGNOSIS — Z7902 Long term (current) use of antithrombotics/antiplatelets: Secondary | ICD-10-CM | POA: Insufficient documentation

## 2016-12-24 DIAGNOSIS — I5032 Chronic diastolic (congestive) heart failure: Secondary | ICD-10-CM

## 2016-12-24 DIAGNOSIS — N189 Chronic kidney disease, unspecified: Secondary | ICD-10-CM | POA: Insufficient documentation

## 2016-12-24 DIAGNOSIS — Z8249 Family history of ischemic heart disease and other diseases of the circulatory system: Secondary | ICD-10-CM | POA: Insufficient documentation

## 2016-12-24 DIAGNOSIS — I130 Hypertensive heart and chronic kidney disease with heart failure and stage 1 through stage 4 chronic kidney disease, or unspecified chronic kidney disease: Secondary | ICD-10-CM | POA: Diagnosis not present

## 2016-12-24 DIAGNOSIS — E785 Hyperlipidemia, unspecified: Secondary | ICD-10-CM | POA: Diagnosis not present

## 2016-12-24 DIAGNOSIS — N3281 Overactive bladder: Secondary | ICD-10-CM | POA: Diagnosis not present

## 2016-12-24 DIAGNOSIS — Z841 Family history of disorders of kidney and ureter: Secondary | ICD-10-CM | POA: Insufficient documentation

## 2016-12-24 DIAGNOSIS — Z87891 Personal history of nicotine dependence: Secondary | ICD-10-CM | POA: Diagnosis not present

## 2016-12-24 DIAGNOSIS — G2581 Restless legs syndrome: Secondary | ICD-10-CM | POA: Diagnosis not present

## 2016-12-24 DIAGNOSIS — Z809 Family history of malignant neoplasm, unspecified: Secondary | ICD-10-CM | POA: Insufficient documentation

## 2016-12-24 DIAGNOSIS — Z96653 Presence of artificial knee joint, bilateral: Secondary | ICD-10-CM | POA: Insufficient documentation

## 2016-12-24 DIAGNOSIS — Z9981 Dependence on supplemental oxygen: Secondary | ICD-10-CM | POA: Insufficient documentation

## 2016-12-24 LAB — BASIC METABOLIC PANEL
Anion gap: 5 (ref 5–15)
BUN: 20 mg/dL (ref 6–20)
CHLORIDE: 107 mmol/L (ref 101–111)
CO2: 27 mmol/L (ref 22–32)
CREATININE: 0.98 mg/dL (ref 0.44–1.00)
Calcium: 9.7 mg/dL (ref 8.9–10.3)
GFR calc non Af Amer: 51 mL/min — ABNORMAL LOW (ref >60)
GFR, EST AFRICAN AMERICAN: 59 mL/min — AB (ref >60)
GLUCOSE: 87 mg/dL (ref 65–99)
Potassium: 4 mmol/L (ref 3.5–5.1)
Sodium: 139 mmol/L (ref 135–145)

## 2016-12-24 NOTE — Progress Notes (Signed)
[name], female    DOB: 1930-06-30, 83 y.o.   MRN: [id number]  HPI  Kristin [name] is a 83 y/o female with a history of atrial fibrillation, right breast cancer, chronic back pain, CKD, COPD, gout, hepatitis, hyperlipidemia, HTN, hypomagnesium and chronic heart failure  Reviewed echo report on 10/29/15 which showed an EF of 55-65% with moderate MR.   Admitted 11/16/16 due to cholecystitis and e coli bacteremia. Treated with IV antibiotics and then transitioned to oral antibiotics. Had ERCP done with stone removal prior to lap choley. Discharged home. Was in the ED 08/22/16 due to shoulder pain after taking down Christmas lights. Evaluated and discharged home.   She presents today for her initial visit with a chief complaint of moderate fatigue with little exertion. She describes it as chronic in nature and has been present for many years. Does improve with rest. Has associated cough, shortness of breath and left foot swelling with this.   Past Medical History:  Diagnosis Date  . Arthritis   . Arthritis   . Atrial fibrillation ([place])   . Back pain    lower back chronic  . Bradycardia   . Breast cancer ([place]) 2004   right breast cancer  . Cancer [facility]) 2004   rt breast cancer-post lumpectomy- chemo/rad  . CHF (congestive heart failure) ([place])   . Chronic back pain   . CKD (chronic kidney disease)   . COPD (chronic obstructive pulmonary disease) ([facility])    wears O2 at 2L via [place] at night  . Cystocele   . Decubitus ulcers   . Gout   . Hematuria   . Hepatitis C   . Hyperlipidemia   . Hypertension   . Hypomagnesemia 06/25/2015  . OAB (overactive bladder)   . Overactive bladder   . Restless leg   . Shoulder pain, bilateral   . Urinary frequency   . UTI (lower urinary tract infection)   . Vaginal atrophy    Past Surgical History:  Procedure Laterality Date  . ABDOMINAL HYSTERECTOMY    . BACK SURGERY    . BREAST SURGERY     rt lumpectomy  . CARPAL TUNNEL RELEASE    .  CATARACT EXTRACTION W/PHACO  10/19/2011   Procedure: CATARACT EXTRACTION PHACO AND INTRAOCULAR LENS PLACEMENT (IOC);  Surgeon: Elta Guadeloupe T. Gershon Crane, MD;  Location: [facility];  Service: Ophthalmology;  Laterality: Right;  CDE:10.81  . CATARACT EXTRACTION W/PHACO  11/02/2011   Procedure: CATARACT EXTRACTION PHACO AND INTRAOCULAR LENS PLACEMENT (IOC);  Surgeon: Elta Guadeloupe T. Gershon Crane, MD;  Location: [facility];  Service: Ophthalmology;  Laterality: Left;  CDE 8.60  . CHOLECYSTECTOMY     3/18  . COLON SURGERY    . JOINT REPLACEMENT     bilateral TKA  . PACEMAKER INSERTION N/A 12/24/2015   Procedure: INSERTION PACEMAKER;  Surgeon: Isaias Cowman, MD;  Location: [facility] ORS;  Service: Cardiovascular;  Laterality: N/A;  . ROTATOR CUFF REPAIR     bilateral   Family History  Problem Relation Age of Onset  . Kidney disease Brother   . Heart disease Father   . Heart disease Mother   . Stroke Mother   . Cancer Sister   . Breast cancer Sister 26  . Breast cancer Paternal Aunt   . Anesthesia problems Neg Hx   . Hypotension Neg Hx   . Malignant hyperthermia Neg Hx   . Pseudochol deficiency Neg Hx    Social History  Substance Use Topics  . Smoking  status: Former Smoker    Packs/day: 1.25    Years: 40.00    Types: Cigarettes    Quit date: 12/16/1998  . Smokeless tobacco: Never Used  . Alcohol use No   Allergies  Allergen Reactions  . Cyclobenzaprine Rash and Hives    Severe Rash  . Flexeril [Cyclobenzaprine Hcl]     Severe Rash  . Vancomycin Rash    [place] Syndrome   Prior to Admission medications   Medication Sig Start Date End Date Taking? Authorizing Provider  albuterol (PROVENTIL) (2.5 MG/3ML) 0.083% nebulizer solution Take 3 mLs (2.5 mg total) by nebulization every 8 (eight) hours as needed. Shortness of breath and wheezing 02/19/16  Yes Arlis Porta., MD  apixaban (ELIQUIS) 5 MG TABS tablet Take 1 tablet (5 mg total) by mouth 2 (two) times daily. 10/30/15  Yes Wieting, [name], MD  aspirin EC  325 MG tablet Take 325 mg by mouth daily.    Yes [provider]  bisacodyl (DULCOLAX) 5 MG EC tablet Take 2 tablets (10 mg total) by mouth daily as needed for moderate constipation. 12/13/16 03/13/17 Yes Milinda Pointer, MD  Cholecalciferol (VITAMIN D) 2000 units tablet Take 2,000 Units by mouth daily.    Yes [provider]  furosemide (LASIX) 40 MG tablet Take 1 tablet (40 mg total) by mouth daily. 12/10/16  Yes Mikey College, NP  levocetirizine (XYZAL) 5 MG tablet Take 1 tablet (5 mg total) by mouth every evening. 06/22/16  Yes Arlis Porta., MD  lisinopril (PRINIVIL,ZESTRIL) 10 MG tablet TAKE 1 TABLET BY MOUTH ONCE DAILY. 09/10/16  Yes Arlis Porta., MD  Oxycodone HCl 20 MG TABS Take 1 tablet (20 mg total) by mouth every 8 (eight) hours as needed. 02/11/17 03/13/17 Yes Milinda Pointer, MD  Oxycodone HCl 20 MG TABS Take 1 tablet (20 mg total) by mouth every 8 (eight) hours as needed. 12/13/16 01/12/17 Yes Milinda Pointer, MD  Oxycodone HCl 20 MG TABS Take 1 tablet (20 mg total) by mouth every 8 (eight) hours as needed. 01/12/17 02/11/17 Yes Milinda Pointer, MD  OXYGEN Inhale 2 Doses into the lungs at bedtime.   Yes [provider]  polyethylene glycol (MIRALAX / GLYCOLAX) packet Take 17 g by mouth daily as needed. 11/22/16  Yes [provider]  rOPINIRole (REQUIP) 2 MG tablet Take 1 tablet (2 mg total) by mouth at bedtime. 12/13/16 03/13/17 Yes Milinda Pointer, MD  Colchicine 0.6 MG CAPS Take 2 pills followed by 1 pill in 1 hour at the start of your gout flare. Patient not taking: Reported on 12/24/2016 12/23/16   Mikey College, NP     Review of Systems  Constitutional: Positive for fatigue (tire easily). Negative for appetite change.  HENT: Negative for congestion, postnasal drip and sore throat.   Eyes: Negative.   Respiratory: Positive for cough (in the mornings) and shortness of breath. Negative for chest tightness.    Cardiovascular: Positive for leg swelling (left foot). Negative for chest pain and palpitations.  Gastrointestinal: Negative for abdominal distention and abdominal pain.  Endocrine: Negative.   Genitourinary: Negative.   Musculoskeletal: Positive for arthralgias (left great toe) and back pain.  Skin: Negative.   Allergic/Immunologic: Negative.   Neurological: Negative for dizziness and light-headedness.  Hematological: Negative for adenopathy. Does not bruise/bleed easily.  Psychiatric/Behavioral: Negative for dysphoric mood and sleep disturbance (sleepingon 1 pillow). The patient is not nervous/anxious.    Vitals:   12/24/16 1131  BP:  125/73  Pulse: 89  Resp: 20  SpO2: 97%  Weight: 161 lb 8 oz (73.3 kg)  Height: '5\' 8"'$  (1.727 m)   Wt Readings from Last 3 Encounters:  12/24/16 161 lb 8 oz (73.3 kg)  12/23/16 162 lb (73.5 kg)  12/13/16 168 lb (76.2 kg)   Physical Exam  Constitutional: She is oriented to person, place, and time. She appears well-developed and well-nourished.  HENT:  Head: Normocephalic and atraumatic.  Neck: Normal range of motion. Neck supple. No JVD present.  Cardiovascular: Normal rate and regular rhythm.   Pulmonary/Chest: Effort normal. She has no wheezes. She has no rales.  Abdominal: Soft. She exhibits no distension. There is no tenderness.  Musculoskeletal: She exhibits edema (1+ pitting edema in left foot) and tenderness (left big toe).  Neurological: She is alert and oriented to person, place, and time.  Skin: Skin is warm and dry.  Psychiatric: She has a normal mood and affect. Her behavior is normal. Thought content normal.  Nursing note and vitals reviewed.  Assessment & Plan:  1: Chronic heart failure with preserved ejection fraction- - NYHA class III - euvolemic today - already weighing daily. Instructed to call for an overnight weight gain of >2 pounds or a weekly weight gain of >5 pounds - does add salt when cooking but none afterwards.  Discussed the importance of not adding any salt to her food and to follow a '2000mg'$  sodium diet. Written dietary information was given to her about this. - elevate legs as much as possible - saw cardiologist (Paraschos) 12/14/16 and returns to him 12/28/16  2: HTN-  - BP looks good today - saw PCP Merrilyn Puma) 12/23/16  3: Gout- - being treated with colchicine - get a BMP today - call PCP if pain persists  Medication bottles were reviewed.  Return here in 1 month or sooner for any questions/problems before then.

## 2016-12-24 NOTE — Progress Notes (Signed)
I have reviewed this encounter including the documentation in this note and/or discussed this patient with the provider, [name], AGPCNP-BC. I am certifying that I agree with the content of this note as supervising physician.  [name], [facility] 12/24/2016, 6:31 AM

## 2016-12-24 NOTE — Patient Instructions (Signed)
Continue weighing daily and call for an overnight weight gain of > 2 pounds or a weekly weight gain of >5 pounds. 

## 2016-12-27 ENCOUNTER — Telehealth: Payer: Self-pay | Admitting: Family

## 2016-12-27 NOTE — Telephone Encounter (Signed)
Spoke with patient and advised that her lab work drawn on 12/24/16 was normal. Patient was appreciative of the call.

## 2016-12-28 ENCOUNTER — Telehealth: Payer: Self-pay | Admitting: Nurse Practitioner

## 2016-12-28 NOTE — Telephone Encounter (Signed)
Pt.called states that her gout was not getting better wanted to know If she can take more  meds.  Pt call back # is  [phone]

## 2016-12-28 NOTE — Telephone Encounter (Signed)
[name],  This far after the onset of a gout flare, there aren't many treatment options.  We tried colchicine and it hasn't worked.  Other options for flares are not likely to work either.  Instead, we will treat the symptoms of inflammation and pain: - Start taking Tylenol extra strength 1 to 2 tablets every 6-8 hours for aches as needed.  Do not take more than 3,000 mg in 24 hours from all medicines.  May also take Ibuprofen as well if tolerated 200-'400mg'$  every 8 hours as needed. Ibuprofen may raise your blood pressure because it can increase your fluid. Watch your daily weights and check your BP daily. If you have an increase of 2 lbs in one day or 5 lbs in 1 week, stop taking ibuprofen and call clinic.  If your BP is above 130/90, stop taking ibuprofen. - Use heat and ice.  Apply this for 15 minutes at a time 6-8 times per day.   - Muscle rub with lidocaine.  Avoid using this before or during use of heat and ice to avoid burns.  Use this after heat or ice.  After your joint swelling is gone, we can use medication all the time to prevent any future gout flares.  We will need to work closely with your kidney doctor to find something that will work well without harming your kidneys.  Schedule an office visit for about 2 weeks after your swelling and pain are improved.   [name], DNP, AGPCNP-BC Adult Gerontology Primary Care Nurse Practitioner [facility]

## 2016-12-29 NOTE — Telephone Encounter (Signed)
Patient reports she if feeling much better today on colchicine and will continue to take as needed. sd

## 2016-12-31 ENCOUNTER — Telehealth: Payer: Self-pay | Admitting: *Deleted

## 2016-12-31 NOTE — Patient Outreach (Signed)
[facility]) Care Management  12/31/2016  Kristin [name] 25-Apr-1930 [id number]  Care Coordination  [facility] CM spoke with [name] to follow up on her pain in her feet after reviewing EPIC.  [name] confirms she saw her pcp and was diagnosed with gout and given colchicine "only nine tabs for now and I am doing much better"  I had to cancel my appointment to get a EKG for Dr Saralyn Pilar because I was sick so I have to reschedule my 01/05/17 appointment with him until I can get the EKG done.  Pt re scheduled for [facility] CM home visit per pt agreement also related to [facility] CM conflict in schedule  Plans:  [facility] CM see patient for [facility] CM  home visit next week  Shatyra Becka L. Lavina Hamman, RN, BSN, [place] Care Management [phone]

## 2017-01-04 ENCOUNTER — Other Ambulatory Visit: Payer: Self-pay | Admitting: Pharmacist

## 2017-01-04 ENCOUNTER — Ambulatory Visit: Payer: Self-pay | Admitting: *Deleted

## 2017-01-04 NOTE — Patient Outreach (Signed)
[place] [facility]) Care Management  01/04/2017  Kristin [name] [birth year] [id number]  Unsuccessful phone follow-up with patient. HIPAA compliant message left requesting return call.   Plan:  Will make another outreach attempt within the next week.   Karrie Meres, PharmD, [place] [phone]

## 2017-01-05 ENCOUNTER — Other Ambulatory Visit: Payer: Self-pay | Admitting: *Deleted

## 2017-01-05 NOTE — Patient Outreach (Signed)
[place] [facility]) Care Management   01/05/2017  Kristin [name] July 13, 1930 [id number]  Kristin [name] is an 83 y.o. female PMH of Chronic diastolic heart failure, COPD, HTN, Chronic kidney disease (CKD), stage III (moderate), s/p pacemaker, CAFL (chronic airflow limitation),Complete rotator cuff rupture of shoulder (Left); DDD (degenerative disc disease), lumbosacral; Gout; H/O cardiac catheterization; Arrhythmia, sinus node (HCC,Lumbar canal stenosis; Urinary frequency/UTI,Seasonal allergies; Breast cancer ofright breast, Chronic pain syndrome; Chronic low back pain, Restless leg syndrome, Neurogenic pain; Long term current use of opiate analgesic; Chronic Opioid-induced constipation, Vitamin D insufficiency, 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 S/p laparoscopic cholecystectomy on 11/20/2016 and ERCP for stone removal, biliary sphincterotomy after an admission on 11/16/2016 to [facility] with acute cholecystitis complicated by choledocholithiasis and E.coli bacteremia. When she was discharged on 11/22/16, her home lasix and lisinopril were held due to mild AKI and hypotension during hospitalization.   Ms Corporan was referred to [facility] on 12/01/16 for complex case management (for recent hospital discharge, post surgery, hx heart failure-pacemaker; having hypotension that requires managing medication; patient is falls risk) after contacted by [facility] telephonic CM, L Manning. Ms [name] states hergrand daughter manages her medications and takes her blood pressure twice daily and helps her with personal care. States son &grand daughter assist with meals &take her to MD appointments.   She has had a CV follow up appointment on 4//24/18 at 2 pm with Dr Josefa Half, Cv that went well per Kristin [name]. Kristin [name] confirms she was scheduled  for an EKG on 12/29/16 but had to cancel it related to having a cold and has rescheduled for once she completes her EKG she is scheduled to return to see the CV "one week later" On 12/27/16 she returned to her [place]  that has healed and decreased in soreness and is now soft vs hard to touch. Her CHF clinic appt for 12/20/16 was cancelled due to staff member out sick but has been rescheduled. THN CM discussed importance of attendance to CHF clinic. Pt voiced understanding.  Subjective: " I got a clean bill of health from my [place] doctors" (related to her [place] who completed her laparoscopic cholecystectomy on 11/20/16 (checked her incision site), " I feel much better"  Objective:   Review of Systems  Constitutional: Negative for chills, diaphoresis, fever, malaise/fatigue and weight loss.  HENT: Positive for hearing loss. Negative for congestion, ear discharge, ear pain, nosebleeds, sinus pain, sore throat and tinnitus.   Eyes: Negative.  Negative for blurred vision, double vision, photophobia, pain, discharge and redness.  Respiratory: Positive for cough, shortness of breath and wheezing. Negative for hemoptysis, sputum production and stridor.        Noted Some sob and wheezing when noted to move at faster pace in home "rushing to get items"  Encouraged to slow her pace   Cardiovascular: Positive for leg swelling. Negative for chest pain, palpitations, orthopnea, claudication and PND.       Left leg and ankle with minimal swelling   Gastrointestinal: Negative.  Negative for abdominal pain, blood in stool, constipation, diarrhea, heartburn, melena, nausea and vomiting.  Genitourinary: Positive for frequency. Negative for dysuria, flank pain, hematuria and urgency.       Kristin [name] used the restroom x 2 to void during 1/1/2 hour home visit   Musculoskeletal: Positive for back pain. Negative for falls, joint pain,  myalgias and neck pain.       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  Negative for itching and rash.  Neurological: Positive for weakness. Negative for dizziness, tingling, tremors, sensory change, speech change, focal weakness, seizures, loss of consciousness and headaches.  Endo/Heme/Allergies: Negative.  Negative for environmental allergies and polydipsia. Does not bruise/bleed easily.  Psychiatric/Behavioral: Negative.  Negative for depression, hallucinations, memory loss, substance abuse and suicidal ideas. The patient is not nervous/anxious and does not have insomnia.     Physical Exam  Constitutional: She is oriented to person, place, and time. She appears well-developed and well-nourished.  HENT:  Head: Normocephalic and atraumatic.  Right Ear: External ear normal.  Left Ear: External ear normal.  Mouth/Throat: Oropharynx is clear and moist.  Eyes: Conjunctivae and EOM are normal. Pupils are equal, round, and reactive to light.  Neck: Normal range of motion. Neck supple.  Cardiovascular: Normal rate, regular rhythm, normal heart sounds and intact distal pulses.   Respiratory: She has wheezes.  Sniffing noted during assessment Reports getting better from a "cold" in the last 1-2 weeks.  GI: Soft. Bowel sounds are normal.  Musculoskeletal: She exhibits edema.  Neurological: She is alert and oriented to person, place, and time.  Skin: Skin is warm and dry.  Psychiatric: She has a normal mood and affect. Her behavior is normal. Judgment and thought content normal.    Encounter Medications:   Outpatient Encounter  Prescriptions as of 01/05/2017  Medication Sig Note  . albuterol (PROVENTIL) (2.5 MG/3ML) 0.083% nebulizer solution Take 3 mLs (2.5 mg total) by nebulization every 8 (eight) hours as needed. Shortness of breath and wheezing   . apixaban (ELIQUIS) 5 MG TABS tablet Take 1 tablet (5 mg total) by mouth 2 (two) times daily.   Marland Kitchen aspirin EC 325 MG tablet Take 325 mg by mouth daily.    . bisacodyl (DULCOLAX) 5 MG EC tablet Take 2 tablets (10 mg total) by mouth daily as needed for moderate constipation.   . Cholecalciferol (VITAMIN D) 2000 units tablet Take 2,000 Units by mouth daily.    . Colchicine 0.6 MG CAPS Take 2 pills followed by 1 pill in 1 hour at the start of your gout flare. (Patient not taking: Reported on 12/24/2016)   . furosemide (LASIX) 40 MG tablet Take 1 tablet (40 mg total) by mouth daily.   Marland Kitchen levocetirizine (XYZAL) 5 MG tablet Take 1 tablet (5 mg total) by mouth every evening.   Marland Kitchen lisinopril (PRINIVIL,ZESTRIL) 10 MG tablet TAKE 1 TABLET BY MOUTH ONCE DAILY. 12/07/2016: Restarted taking lisinopril on 11/27/16 after pcp visit on 11/26/16 per MD order   . [START ON 02/11/2017] Oxycodone HCl 20 MG TABS Take 1 tablet (20 mg total) by mouth every 8 (eight) hours as needed. 11/11/2016: DO NOT DELETE, even if Expired!!! See Dr. Adalberto Cole care coordination note.  . Oxycodone HCl 20 MG TABS Take 1 tablet (20 mg total) by mouth every 8 (eight) hours as needed. 11/11/2016: DO NOT DELETE, even if Expired!!! See Dr. Adalberto Cole care coordination note.  Derrill Memo ON 01/12/2017] Oxycodone HCl 20 MG TABS Take 1 tablet (20 mg total) by mouth every 8 (  eight) hours as needed. 11/11/2016: DO NOT DELETE, even if Expired!!! See Dr. Adalberto Cole care coordination note.  . OXYGEN Inhale 2 Doses into the lungs at bedtime.   . polyethylene glycol (MIRALAX / GLYCOLAX) packet Take 17 g by mouth daily as needed.   Marland Kitchen rOPINIRole (REQUIP) 2 MG tablet Take 1 tablet (2 mg total) by mouth at bedtime.    No facility-administered encounter  medications on file as of 01/05/2017.     Functional Status:   In your present state of health, do you have any difficulty performing the following activities: 12/24/2016 12/07/2016  Hearing? Tempie Donning  Vision? N -  Difficulty concentrating or making decisions? Y -  Walking or climbing stairs? Y -  Dressing or bathing? N -  Doing errands, shopping? N -  Some recent data might be hidden    Fall/Depression Screening:    Fall Risk  12/24/2016 12/21/2016 12/13/2016  Falls in the past year? No No No  Risk for fall due to : Impaired balance/gait Impaired balance/gait;Impaired mobility;Medication side effect;Impaired vision -   PHQ 2/9 Scores 12/24/2016 12/21/2016 12/13/2016 11/30/2016 11/11/2016 09/14/2016 07/28/2016  PHQ - 2 Score 0 0 0 0 0 0 0  Exception Documentation - - - - - - -    Assessment:    Acute Medical issues   Back pain but with PMH as indicated in ROS.  Kristin [name] denies need for any further surgery.  Her Home health PT and OT sessions have concluded.  As she is ambulating long distances in her home and driving herself locally to appointments and to run errands.  She states she does not prefer to drive on the "interstate roads" Kristin [name] already is connected with a pain management program with noted medications regimen on medication review.  THN CM recommended discussing her pain with her pain management provider, use of heat or ice, use os stretches and exercises already taught by [facility] staff, use of a back massager ([employer]) or TENs ([employer]) Kristin [name] will get grand daughter to assist with [employer] purchases in [place] store.   Cough  Kristin [name] was noted with a cough during [facility] CM first visit and Lisinopril question. THN CM notes she was restarted on Lisinopril on 11/27/16 per Medication review after her 11/26/16 visit with a MD.  Community services issues- New BP cuff, [place] DME bill, hearing aid, Eliquis pharmacy concern   Chronic medical issues (CHF, COPD,  Kristin [name] continues to keep good  records of her BP, pulse and weight in her [facility] Calendar SHe wears Oxygen at night and uses her nebulizer prn CHF/Gout symptoms- Kristin [name] noted to continue to have some minimal swelling in her left lower leg and ankle but her right leg and ankle are very good. Weight values noted to be within 1-3 pounds range Continues to take Lasix 40 mg po and colchicine.  THN CM also counseled on use of colchicine and monitoring diet for any foods causing issues like seafood.  Pt states she "love shrimp"     Plan:  THN CM called and spoke with Mohammed Kindle at 1044 am [employer] to assist with getting Kristin Broecker quarterly benefit information connected with her united health care medicare coverage (educated Kristin Guidotti about coverage benefit- her $74 credit does not roll over each quarter-next quarter begins on February 19 2017) and ordering her a catalog to be sent out in the next 7-10 business days Kristin [name] ordered a new BP cuff today using her $  40 quarterly credit and paid $5 extra. Item to be delivered to her home and she will receive a call from a [employer] supervisor for processing of $5 charge [facility] CM entered [employer] contact information in her [facility] calendar for future reference  THN CM assisted Kristin [name] to call [place] [facility]) customer services to change her home number in their records as suggested by [employer], to discuss the [place] DME charge. Kristin [name] was able to review [facility] records to see no claims sent in for the amount on Kristin Allbritton 12/08/16 bill.  Kristin [name] called [employer] to find this bill sen to Kristin Patnode was in error and Pt only owes $15.20 which would be bill next month Kristin [name] voiced lots appreciation in clarifying this billing issue.  Also assistance given for the in network hearing aid, audiology (ENT) provider, [place] [phone].  THN CM and Kristin [name] spoke with Kristin [name], Delia Heady and Kristin [name] again at [facility] to discuss Kristin Butrick  benefit options and the directions to obtaining hearing aids.  Kristin Kwan was advised to have her hearing test done a her choice of audiologists and then fax test results to [facility] [phone].  Kristin Norfolk voice understanding.  A list of in network ENT provider but the [facility] one is no longer available Carron Curie and Dr Loura Back [facility] [phone]- [phone]) Other in network ENTs included [facility] audiology [place] [facility] [phone] and Fern[facility] Parkins [place] but Kristin Geng does not prefer to go to [place] nor [place].   A message was left for [facility] at [phone] during their lunch hour They will return a call to Kristin Giaimo to schedule an appointment for hearing test to be complete [facility] CM left [facility] fax number in Kristin [place]' [facility] calendar to have [place] [facility] to fax test results to [place] to have hearing aid to be shipped to her in the mail.  The recommended [place] [facility] office is not taking appointments at this time( hiring new ENT provider per Kristin [name]) Kristin [name] placed Kristin Stannard on a waiting list to receive a call for an appointment  Kristin Bauers will be seen for a [facility] CM home visit in 2 weeks and this information was written on her wall calendar near her back door.  THN CM Care Plan Problem One     Most Recent Value  Care Plan Problem One  Knowledge deficit of congestive heart failure home management (signs/symptoms, treatment interventions  Role Documenting the Problem One  Care Management Coordinator  Care Plan for Problem One  Active  THN Long Term Goal   Over the next 45 days the patient will be able to verbalize knowledge  and demonstrate home management interventions for CHF  THN Long Term Goal Start Date  12/07/16  [facility] Long Term Goal Met Date  12/21/16  Interventions for Problem One Long Term Goal  Assess and discuss CHF diagnosis, sympotoms/zone symptoms to report to doctor, diet, activity,  medications , weights Provided  written materials, monitor for symptoms like sob weight gain & edema  THN CM Short Term Goal #1   over the next 7 days the patient will take ordered diuretics to decrease edema  THN CM Short Term Goal #1 Start Date  12/07/16  [facility] CM Short Term Goal #1 Met Date  12/21/16  Interventions for Short Term Goal #  1  Assess zone CHF symptoms, examined feet, lungs, medications, called pcp with noted CHF symptoms, Order for increase in lasix obtained Educated pt on zone symptoms, medications (how to take new ordered medications) and when to call pcp Provieded written CHF materials   THN CM Short Term Goal #2   Over the next 14-21 days the patient will be able to knowledge about CHF home treatment to include verbalizing zone symptoms she will call into her doctor, daily weights, diet, activity and understanding of use of CHF medications  THN CM Short Term Goal #2 Start Date  12/07/16  [facility] CM Short Term Goal #2 Met Date  12/21/16  Interventions for Short Term Goal #2  assess and education on CHF home treatment using CHF booklet/handouts/Use teach back method to evaluate knowledge of CHF home care needs     [facility] CM Care Plan Problem Two     Most Recent Value  Care Plan Problem Two   Possible side effect of Lisinopril or CHF yellow zone coughing symptom assistance needed plus  increase cost in medications reported (referral to [facility] pharmacist)   Role Documenting the Problem Two  Care Management [place] for Problem Two  Active  Interventions for Problem Two Long Term Goal   Discussed lisinopril side effect with pcp, NP and patient, [facility] pharmacy referral after permission given  THN Long Term Goal  over the next 30 - 45 days patient will be assisted with lisinopril and her medication costs  THN Long Term Goal Start Date  12/07/16  THN CM Short Term Goal #1   Over the next 30 days the patient will receive [facility] pharmacy referral for asisstance with Lisinopril use and  possible assistance with costs of medications  THN CM Short Term Goal #1 Start Date  12/07/16  Interventions for Short Term Goal #2   [facility] pharmacy referral Discussed CHF symptoms related to medicines    [facility] CM Care Plan Problem Three     Most Recent Value  Care Plan Problem Three  Knowledge of gout flare up management   Role Documenting the Problem Three  Care Management [place] for Problem Three  Active  THN Long Term Goal   over the next 31 days gout flare up will be resolved  THN Long Term Goal Start Date  12/21/16  [facility] Long Term Goal Met Date  01/05/17  Interventions for Problem Three Long Term Goal  review gout symptoms, encourage call to pcp, assessed painful gout site Provided written symptoms to monitor for   [facility] CM Short Term Goal #1   over the next 14 day patient will make contact with pcp to request treatment plan for gout management/resolution  [facility] CM Short Term Goal #1 Start Date  12/21/16  [facility] CM Short Term Goal #1 Met Date  01/05/17  Interventions for Short Term Goal #1  review gout symptoms, encourage call to pcp, assessed painful gout site Provided written symptoms to monitor for        Henry Utsey L. Lavina Hamman, RN, BSN, [place] Care Management [phone]

## 2017-01-13 ENCOUNTER — Other Ambulatory Visit: Payer: Self-pay | Admitting: Pharmacist

## 2017-01-13 NOTE — Patient Outreach (Signed)
[facility]) Care Management  01/13/2017  SOPHIEA UEDA Mar 28, 1930 [id number]  Second unsuccessful phone outreach attempt to patient.  HIPAA compliant message left requesting return call.   Plan:  If no return call, will make third phone outreach attempt within next 10 business days.   Karrie Meres, PharmD, [place] [phone]

## 2017-01-19 ENCOUNTER — Ambulatory Visit: Payer: Self-pay | Admitting: *Deleted

## 2017-01-24 ENCOUNTER — Encounter: Payer: Self-pay | Admitting: Pain Medicine

## 2017-01-24 ENCOUNTER — Encounter: Payer: Self-pay | Admitting: Family

## 2017-01-24 ENCOUNTER — Ambulatory Visit: Payer: Medicare Other | Attending: Pain Medicine | Admitting: Pain Medicine

## 2017-01-24 ENCOUNTER — Ambulatory Visit: Payer: Medicare Other | Admitting: Family

## 2017-01-24 VITALS — BP 114/67 | HR 66 | Temp 98.4°F | Resp 18 | Ht 68.0 in | Wt 161.0 lb

## 2017-01-24 VITALS — BP 131/73 | HR 88 | Resp 20 | Ht 68.0 in | Wt 159.4 lb

## 2017-01-24 DIAGNOSIS — M961 Postlaminectomy syndrome, not elsewhere classified: Secondary | ICD-10-CM | POA: Diagnosis not present

## 2017-01-24 DIAGNOSIS — M549 Dorsalgia, unspecified: Secondary | ICD-10-CM | POA: Diagnosis not present

## 2017-01-24 DIAGNOSIS — Z79899 Other long term (current) drug therapy: Secondary | ICD-10-CM | POA: Insufficient documentation

## 2017-01-24 DIAGNOSIS — N183 Chronic kidney disease, stage 3 (moderate): Secondary | ICD-10-CM | POA: Insufficient documentation

## 2017-01-24 DIAGNOSIS — J449 Chronic obstructive pulmonary disease, unspecified: Secondary | ICD-10-CM | POA: Insufficient documentation

## 2017-01-24 DIAGNOSIS — B1920 Unspecified viral hepatitis C without hepatic coma: Secondary | ICD-10-CM | POA: Diagnosis not present

## 2017-01-24 DIAGNOSIS — Z9981 Dependence on supplemental oxygen: Secondary | ICD-10-CM | POA: Insufficient documentation

## 2017-01-24 DIAGNOSIS — I4891 Unspecified atrial fibrillation: Secondary | ICD-10-CM | POA: Diagnosis not present

## 2017-01-24 DIAGNOSIS — Z881 Allergy status to other antibiotic agents status: Secondary | ICD-10-CM | POA: Diagnosis not present

## 2017-01-24 DIAGNOSIS — I5032 Chronic diastolic (congestive) heart failure: Secondary | ICD-10-CM | POA: Insufficient documentation

## 2017-01-24 DIAGNOSIS — Z87891 Personal history of nicotine dependence: Secondary | ICD-10-CM | POA: Diagnosis not present

## 2017-01-24 DIAGNOSIS — G8929 Other chronic pain: Secondary | ICD-10-CM

## 2017-01-24 DIAGNOSIS — M4696 Unspecified inflammatory spondylopathy, lumbar region: Secondary | ICD-10-CM | POA: Diagnosis not present

## 2017-01-24 DIAGNOSIS — G9619 Other disorders of meninges, not elsewhere classified: Secondary | ICD-10-CM

## 2017-01-24 DIAGNOSIS — Z7982 Long term (current) use of aspirin: Secondary | ICD-10-CM | POA: Insufficient documentation

## 2017-01-24 DIAGNOSIS — Z96653 Presence of artificial knee joint, bilateral: Secondary | ICD-10-CM | POA: Insufficient documentation

## 2017-01-24 DIAGNOSIS — Z7901 Long term (current) use of anticoagulants: Secondary | ICD-10-CM | POA: Insufficient documentation

## 2017-01-24 DIAGNOSIS — I130 Hypertensive heart and chronic kidney disease with heart failure and stage 1 through stage 4 chronic kidney disease, or unspecified chronic kidney disease: Secondary | ICD-10-CM | POA: Diagnosis not present

## 2017-01-24 DIAGNOSIS — Z853 Personal history of malignant neoplasm of breast: Secondary | ICD-10-CM | POA: Diagnosis not present

## 2017-01-24 DIAGNOSIS — I10 Essential (primary) hypertension: Secondary | ICD-10-CM

## 2017-01-24 DIAGNOSIS — M47816 Spondylosis without myelopathy or radiculopathy, lumbar region: Secondary | ICD-10-CM

## 2017-01-24 DIAGNOSIS — M533 Sacrococcygeal disorders, not elsewhere classified: Secondary | ICD-10-CM | POA: Diagnosis not present

## 2017-01-24 DIAGNOSIS — M109 Gout, unspecified: Secondary | ICD-10-CM | POA: Diagnosis not present

## 2017-01-24 DIAGNOSIS — G894 Chronic pain syndrome: Secondary | ICD-10-CM

## 2017-01-24 DIAGNOSIS — G2581 Restless legs syndrome: Secondary | ICD-10-CM | POA: Insufficient documentation

## 2017-01-24 DIAGNOSIS — E785 Hyperlipidemia, unspecified: Secondary | ICD-10-CM | POA: Diagnosis not present

## 2017-01-24 DIAGNOSIS — M545 Low back pain: Secondary | ICD-10-CM

## 2017-01-24 DIAGNOSIS — G96198 Other disorders of meninges, not elsewhere classified: Secondary | ICD-10-CM

## 2017-01-24 NOTE — Patient Instructions (Addendum)
____________________________________________________________________________________________  Appointment Policy  It is our goal and responsibility to provide the medical community with assistance in the evaluation and management of patients with chronic pain. Unfortunately our resources are limited. Because we do not have an unlimited amount of time, or available appointments, we are required to closely monitor and manage their use. The following rules exist to maximize their use:  Patient's responsibilities: 1. Punctuality: You are required to be physically present and registered in our facility at least 30 minutes before your appointment. 2. Tardiness: The cutoff is your appointment time. If you have an appointment scheduled for 10:00 AM and you arrive at 10:01, you will be required to reschedule your appointment.  3. Plan ahead: Always assume that you will encounter traffic on your way in. Plan for it. If you are dependent on a driver, make sure they understand these rules and the need to arrive early. 4. Other appointments and responsibilities: Avoid scheduling any other appointments before or after your pain clinic appointments.  5. Be prepared: Write down everything that you need to discuss with your healthcare provider and give this information to the admitting nurse. Write down the medications that you will need refilled. Bring your pills and bottles (even the empty ones), to all of your appointments, except for those where a procedure is scheduled. 6. No children or pets: Find someone to take care of them. It is not appropriate to bring them in. 7. Scheduling changes: We request "advanced notification" of any changes or cancellations. 8. Advanced notification: Defined as a time period of more than 24 hours prior to the originally scheduled appointment. This allows for the appointment to be offered to other patients. 9. Rescheduling: When a visit is rescheduled, it will require the  cancellation of the original appointment. For this reason they both fall within the category of "Cancellations".  10. Cancellations: They require advanced notification. Any cancellation less than 24 hours before the  appointment will be recorded as a "No Show". 11. No Show: Defined as an unkept appointment where the patient failed to notify or declare to the practice their intention or inability to keep the appointment.  Corrective process for repeat offenders:  1. Tardiness: Three (3) episodes of rescheduling due to late arrivals will be recorded as one (1) "No Show". 2. Cancellation or reschedule: Three (3) cancellations or rescheduling will be recorded as one (1) "No Show". 3. "No Shows": Three (3) "No Shows" within a 12 month period will result in discharge from the practice.  ____________________________________________________________________________________________   ____________________________________________________________________________________________  Medication Rules  Applies to: All patients receiving prescriptions (written or electronic).  Pharmacy of record: Pharmacy where electronic prescriptions will be sent. If written prescriptions are taken to a different pharmacy, please inform the nursing staff. The pharmacy listed in the electronic medical record should be the one where you would like electronic prescriptions to be sent.  Prescription refills: Only during scheduled appointments. Applies to both, written and electronic prescriptions.  NOTE: The following applies primarily to controlled substances (Opioid Pain Medications)  Patient's responsibilities: 1. Pain Pills: Bring all pain pills to every appointment (except for procedure appointments). 2. Pill Bottles: Bring pills in original pharmacy bottle. Always bring newest bottle. Bring bottle, even if empty. 3. Medication refills: You are responsible for knowing and keeping track of what medications you need  refilled. The day before your appointment, write a list of all prescriptions that need to be refilled. Bring that list to your appointment and give it to the  admitting nurse. Prescriptions will be written only during appointments. If you forget a medication, it will not be "Called in", "Faxed", or "electronically sent". You will need to get another appointment to get these prescribed. 4. Prescription Accuracy: You are responsible for carefully inspecting your prescriptions before leaving our office. Have the discharge nurse carefully go over each prescription with you, before taking them home. Make sure that your name is accurately spelled, that your address is correct. Check the name and dose of your medication to make sure it is accurate. Check the number of pills, and the written instructions to make sure they are clear and accurate. Make sure that you are given enough medication to last until your next medication refill appointment. 5. Taking Medication: Take medication as prescribed. Never take more pills than instructed. Never take medication more frequently than prescribed. Taking less pills or less frequently is permitted and encouraged, when it comes to controlled substances (written prescriptions).  6. Inform other Doctors: Always inform, all of your healthcare providers, of all the medications you take. 7. Pain Medication from other Providers: You are not allowed to accept any additional pain medication from any other Doctor or Healthcare provider. There are two exceptions to this rule. (see below) In the event that you require additional pain medication, you are responsible for notifying us, as stated below. 8. Medication Agreement: You are responsible for carefully reading and following our Medication Agreement. This must be signed before receiving any prescriptions from our practice. Safely store a copy of your signed Agreement. Violations to the Agreement will result in no further prescriptions.  (Additional copies of our Medication Agreement are available upon request.) 9. Laws, Rules, & Regulations: All patients are expected to follow all Federal and [employer], Rules, [employer]. Ignorance of the Laws does not constitute a valid excuse.  Exceptions: There are only two exceptions to the rule of not receiving pain medications from other Healthcare Providers. 1. Exception #1 (Emergencies): In the event of an emergency (i.e.: accident requiring emergency care), you are allowed to receive additional pain medication. However, you are responsible for: As soon as you are able, call our office [phone], at any time of the day or night, and leave a message stating your name, the date and nature of the emergency, and the name and dose of the medication prescribed. In the event that your call is answered by a member of our staff, make sure to document and save the date, time, and the name of the person that took your information.  2. Exception #2 (Planned Surgery): In the event that you are scheduled by another doctor or dentist to have any type of surgery or procedure, you are allowed (for a period no longer than 30 days), to receive additional pain medication, for the acute post-op pain. However, in this case, you are responsible for picking up a copy of our "Post-op Pain Management for Surgeons" handout, and giving it to your surgeon or dentist. This document is available at our office, and does not require an appointment to obtain it. Simply go to our office during business hours (Monday-Thursday from 8:00 AM to 4:00 PM) (Friday 8:00 AM to 12:00 Noon) or if you have a scheduled appointment with [name], prior to your surgery, and ask for it by name. In addition, you will need to provide [name] with your name, name of your surgeon, type of surgery, and date of procedure or  surgery.  ____________________________________________________________________________________________ ____________________________________________________________________________________________  Pain Scale  Introduction: The pain score used by this practice is the Verbal Numerical Rating Scale (VNRS-11). This is an 11-point scale. It is for adults and children 10 years or older. There are significant differences in how the pain score is reported, used, and applied. Forget everything you learned in the past and learn this scoring system.  General Information: The scale should reflect your current level of pain. Unless you are specifically asked for the level of your worst pain, or your average pain. If you are asked for one of these two, then it should be understood that it is over the past 24 hours.  Basic Activities of Daily Living (ADL): Personal hygiene, dressing, eating, transferring, and using restroom.  Instructions: Most patients tend to report their level of pain as a combination of two factors, their physical pain and their psychosocial pain. This last one is also known as "suffering" and it is reflection of how physical pain affects you socially and psychologically. From now on, report them separately. From this point on, when asked to report your pain level, report only your physical pain. Use the following table for reference.  Pain Clinic Pain Levels (0-5/10)  Pain Level Score  Description  No Pain 0   Mild pain 1 Nagging, annoying, but does not interfere with basic activities of daily living (ADL). Patients are able to eat, bathe, get dressed, toileting (being able to get on and off the toilet and perform personal hygiene functions), transfer (move in and out of bed or a chair without assistance), and maintain continence (able to control bladder and bowel functions). Blood pressure and heart rate are unaffected. A normal heart rate for a healthy adult ranges from 60 to 100 bpm  (beats per minute).   Mild to moderate pain 2 Noticeable and distracting. Impossible to hide from other people. More frequent flare-ups. Still possible to adapt and function close to normal. It can be very annoying and may have occasional stronger flare-ups. With discipline, patients may get used to it and adapt.   Moderate pain 3 Interferes significantly with activities of daily living (ADL). It becomes difficult to feed, bathe, get dressed, get on and off the toilet or to perform personal hygiene functions. Difficult to get in and out of bed or a chair without assistance. Very distracting. With effort, it can be ignored when deeply involved in activities.   Moderately severe pain 4 Impossible to ignore for more than a few minutes. With effort, patients may still be able to manage work or participate in some social activities. Very difficult to concentrate. Signs of autonomic nervous system discharge are evident: dilated pupils (mydriasis); mild sweating (diaphoresis); sleep interference. Heart rate becomes elevated (>115 bpm). Diastolic blood pressure (lower number) rises above 100 mmHg. Patients find relief in laying down and not moving.   Severe pain 5 Intense and extremely unpleasant. Associated with frowning face and frequent crying. Pain overwhelms the senses.  Ability to do any activity or maintain social relationships becomes significantly limited. Conversation becomes difficult. Pacing back and forth is common, as getting into a comfortable position is nearly impossible. Pain wakes you up from deep sleep. Physical signs will be obvious: pupillary dilation; increased sweating; goosebumps; brisk reflexes; cold, clammy hands and feet; nausea, vomiting or dry heaves; loss of appetite; significant sleep disturbance with inability to fall asleep or to remain asleep. When persistent, significant weight loss is observed due to the complete loss of appetite and sleep deprivation.  Blood pressure and heart  rate  becomes significantly elevated. Caution: If elevated blood pressure triggers a pounding headache associated with blurred vision, then the patient should immediately seek attention at an urgent or emergency care unit, as these may be signs of an impending stroke.    Emergency Department Pain Levels (6-10/10)  Emergency Room Pain 6 Severely limiting. Requires emergency care and should not be seen or managed at an outpatient pain management facility. Communication becomes difficult and requires great effort. Assistance to reach the emergency department may be required. Facial flushing and profuse sweating along with potentially dangerous increases in heart rate and blood pressure will be evident.   Distressing pain 7 Self-care is very difficult. Assistance is required to transport, or use restroom. Assistance to reach the emergency department will be required. Tasks requiring coordination, such as bathing and getting dressed become very difficult.   Disabling pain 8 Self-care is no longer possible. At this level, pain is disabling. The individual is unable to do even the most "basic" activities such as walking, eating, bathing, dressing, transferring to a bed, or toileting. Fine motor skills are lost. It is difficult to think clearly.   Incapacitating pain 9 Pain becomes incapacitating. Thought processing is no longer possible. Difficult to remember your own name. Control of movement and coordination are lost.   The worst pain imaginable 10 At this level, most patients pass out from pain. When this level is reached, collapse of the autonomic nervous system occurs, leading to a sudden drop in blood pressure and heart rate. This in turn results in a temporary and dramatic drop in blood flow to the brain, leading to a loss of consciousness. Fainting is one of the body's self defense mechanisms. Passing out puts the brain in a calmed state and causes it to shut down for a while, in order to begin the  healing process.    Summary: 1. Refer to this scale when providing [name] with your pain level. 2. Be accurate and careful when reporting your pain level. This will help with your care. 3. Over-reporting your pain level will lead to loss of credibility. 4. Even a level of 1/10 means that there is pain and will be treated at our facility. 5. High, inaccurate reporting will be documented as "Symptom Exaggeration", leading to loss of credibility and suspicions of possible secondary gains such as obtaining more narcotics, or wanting to appear disabled, for fraudulent reasons. 6. Only pain levels of 5 or below will be seen at our facility. 7. Pain levels of 6 and above will be sent to the Emergency Department and the appointment cancelled. ____________________________________________________________________________________________  ____________________________________________________________________________________________  Post-Procedure instructions Instructions:  Apply ice: Fill a plastic sandwich bag with crushed ice. Cover it with a small towel and apply to injection site. Apply for 15 minutes then remove x 15 minutes. Repeat sequence on day of procedure, until you go to bed. The purpose is to minimize swelling and discomfort after procedure.  Apply heat: Apply heat to procedure site starting the day following the procedure. The purpose is to treat any soreness and discomfort from the procedure.  Food intake: Start with clear liquids (like water) and advance to regular food, as tolerated.   Physical activities: Keep activities to a minimum for the first 8 hours after the procedure.   Driving: If you have received any sedation, you are not allowed to drive for 24 hours after your procedure.  Blood thinner: Restart your blood thinner 6 hours after your procedure. (Only for those taking blood thinners)  Insulin: As soon as you can eat, you may resume your normal dosing schedule. (Only for  those taking insulin)  Infection prevention: Keep procedure site clean and dry.  Post-procedure Pain Diary: Extremely important that this be done correctly and accurately. Recorded information will be used to determine the next step in treatment.  Pain evaluated is that of treated area only. Do not include pain from an untreated area.  Complete every hour, on the hour, for the initial 8 hours. Set an alarm to help you do this part accurately.  Do not go to sleep and have it completed later. It will not be accurate.  Follow-up appointment: Keep your follow-up appointment after the procedure. Usually 2 weeks for most procedures. (6 weeks in the case of radiofrequency.) Bring you pain diary.  Expect:  From numbing medicine (AKA: Local Anesthetics): Numbness or decrease in pain.  Onset: Full effect within 15 minutes of injected.  Duration: It will depend on the type of local anesthetic used. On the average, 1 to 8 hours.   From steroids: Decrease in swelling or inflammation. Once inflammation is improved, relief of the pain will follow.  Onset of benefits: Depends on the amount of swelling present. The more swelling, the longer it will take for the benefits to be seen.   Duration: Steroids will stay in the system x 2 weeks. Duration of benefits will depend on multiple posibilities including persistent irritating factors.  From procedure: Some discomfort is to be expected once the numbing medicine wears off. This should be minimal if ice and heat are applied as instructed. Call if:  You experience numbness and weakness that gets worse with time, as opposed to wearing off.  New onset bowel or bladder incontinence. (Spinal procedures only)  Emergency Numbers:  [facility] business hours (Monday - Thursday, 8:00 AM - 4:00 PM) (Friday, 9:00 AM - 12:00 Noon): [phone]  After hours: [phone]  [phone] ____________________________________________________________________________________________

## 2017-01-24 NOTE — Progress Notes (Signed)
Patient's Name: Kristin [name]  MRN: [id number]  Referring Provider: Mikey College, *  DOB: 06/16/1930  PCP: Mikey College, NP  DOS: 01/24/2017  Note by: Kathlen Brunswick. Dossie Arbour, MD  Service setting: Ambulatory outpatient  Specialty: Interventional Pain Management  Location: [facility] (AMB) Pain Management Facility    Patient type: Established   Primary Reason(s) for Visit: Encounter for post-procedure evaluation of chronic illness with mild to moderate exacerbation CC: Back Pain (low)  HPI  Kristin [name] is a 83 y.o. year old, female patient, who comes today for a post-procedure evaluation. She has Chronic kidney disease (CKD), stage III (moderate); DDD (degenerative disc disease), lumbosacral; Gout; Benign essential HTN; Arrhythmia, sinus node; Lumbar canal stenosis; Chronic obstructive pulmonary disease ([place]); Chemical diabetes; History of cardiac catheterization; Seasonal allergies; History of breast cancer; Chronic pain syndrome; Chronic low back pain (Location of Primary Source of Pain) (Bilateral) (L>R); Lumbar facet syndrome (Bilateral) (L>R); Lumbar spondylosis; Chronic lumbar radicular pain (Bilateral) (L>R) (L5 Dermatome); Restless leg syndrome; Myofascial pain; Neurogenic pain; Long term current use of opiate analgesic; Opiate use (90 MME/Day); Opioid-induced constipation (OIC); Vitamin D insufficiency; Grade 1 Retrolisthesis of L1 over L2; Lumbar facet hypertrophy (L1-2, L2-3, and L4-5); Stenosis of lateral recess of lumbar spine (L1-2 and L4-5); 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); Epidural fibrosis; 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 Her primarily concern today is the Back Pain (low)  Pain Assessment: Self-Reported Pain Score: 5 /10 Clinically the patient looks like a 2/10 Reported level is inconsistent with clinical observations. Information on the proper use of the pain scale provided to the patient today Pain Type: Chronic pain Pain Location: Back Pain Orientation: Lower Pain Descriptors / Indicators: Aching Pain Frequency: Constant  Kristin [name] comes in today for post-procedure evaluation after the treatment done on 12/13/2016. The patient indicates not having obtained any benefit from the radiofrequency and therefore we will not repeat it again. Today we had a very long discussion with regards to her medicines. She is under the impression that I switched her medicine, but as it turns out we have not made any changes in the past year. Most likely, she is being giving oxycodone from a different pharmaceutical company. This is likely to be the case if pharmacy has ran out of the one that she normally gets. She was under the impression that she was not getting as much medication because the pill is smaller and her co-pay is lower and therefore she was thinking that she was getting less medication. The fact that the matter is that her medication regimen has not changed at all. She was also indicating that the medicine was not working and that she was asked to change back to what she had. I explained to her that she is still on the same one and we had not changed it. At this point I think there is a significant psychological component here driving her complaints. On the other hand, there is always a possibility that she may be developing tolerance to the medication in which case I have  offered  her to do a "drug holiday". I explained to the patient what the drug holiday was and she was not very enthusiastic about it. On her next visit, I will address this issue again and will probably taper her opioids down and stop them for about 2 weeks after which, I will restart them and I am sure they will work much better for her.  Further details on both, my assessment(s), as well as the proposed treatment plan, please see below.  Post-Procedure Assessment  12/13/2016 Procedure: Left-sided lumbar facet radiofrequency ablation under fluoroscopic guidance and IV sedation Pre-procedure pain score:  5/10 Post-procedure pain score: 0/10 (100% relief) Influential Factors: BMI: 24.48 kg/m Intra-procedural challenges: None observed Assessment challenges: None detected         Post-procedural side-effects, adverse reactions, or complications: None reported Reported issues: None  Sedation: Sedation provided. When no sedatives are used, the analgesic levels obtained are directly associated to the effectiveness of the local anesthetics. However, when sedation is provided, the level of analgesia obtained during the initial 1 hour following the intervention, is believed to be the result of a combination of factors. These factors may include, but are not limited to: 1. The effectiveness of the local anesthetics used. 2. The effects of the analgesic(s) and/or anxiolytic(s) used. 3. The degree of discomfort experienced by the patient at the time of the procedure. 4. The patients ability and reliability in recalling and recording the events. 5. The presence and influence of possible secondary gains and/or psychosocial factors. Reported result: Relief experienced during the 1st hour after the procedure: 100 % (Ultra-Short Term Relief) Interpretative annotation: Analgesia during this period is likely to be Local Anesthetic and/or IV Sedative (Analgesic/Anxiolitic) related.          Effects of local  anesthetic: The analgesic effects attained during this period are directly associated to the localized infiltration of local anesthetics and therefore cary significant diagnostic value as to the etiological location, or anatomical origin, of the pain. Expected duration of relief is directly dependent on the pharmacodynamics of the local anesthetic used. Long-acting (4-6 hours) anesthetics used.  Reported result: Relief during the next 4 to 6 hour after the procedure: 100 % (Short-Term Relief) Interpretative annotation: Complete relief would suggest area to be the source of the pain.          Long-term benefit: Defined as the period of time past the expected duration of local anesthetics. With the possible exception of prolonged sympathetic blockade from the local anesthetics, benefits during this period are typically attributed to, or associated with, other factors such as analgesic sensory neuropraxia, antiinflammatory effects, or beneficial biochemical changes provided by agents other than the local anesthetics Reported result: Extended relief following procedure: 0 % (Long-Term Relief) Interpretative annotation: No benefit. Unexpected therapeutic failure. No benefit, or incomplete benefit may suggest failure to adequately lesion the nerve  Current benefits: Defined as persistent relief that continues at this point in time.   Reported results: Treated area: 0 % Ms. Lara reports improvement in function Interpretative annotation: No benefit whatsoever. This would argue against repeating therapy  Interpretation: Results would suggest failure of therapy in achieving desired goal(s). Possible adjustment in plan of care may be required  Laboratory Chemistry  Inflammation Markers Lab Results  Component Value Date   CRP <0.5 09/24/2015   ESRSEDRATE 12 09/24/2015   (CRP: Acute Phase) (ESR: Chronic Phase) Renal Function Markers Lab Results  Component Value Date   BUN 20 12/24/2016   CREATININE  0.98 12/24/2016  GFRAA 59 (L) 12/24/2016   GFRNONAA 51 (L) 12/24/2016   Hepatic Function Markers Lab Results  Component Value Date   AST 21 03/02/2016   ALT 10 03/02/2016   ALBUMIN 3.7 03/02/2016   ALKPHOS 74 03/02/2016   Electrolytes Lab Results  Component Value Date   NA 139 12/24/2016   K 4.0 12/24/2016   CL 107 12/24/2016   CALCIUM 9.7 12/24/2016   MG 2.2 09/24/2015   Neuropathy Markers No results found for: [id number] Bone Pathology Markers Lab Results  Component Value Date   ALKPHOS 74 03/02/2016   CALCIUM 9.7 12/24/2016   Coagulation Parameters Lab Results  Component Value Date   INR 1.24 12/16/2015   LABPROT 15.8 (H) 12/16/2015   APTT 26 12/16/2015   PLT 191 03/02/2016   Cardiovascular Markers Lab Results  Component Value Date   HGB 13.5 03/02/2016   HCT 41.6 03/02/2016   Note: Lab results reviewed.  Recent Diagnostic Imaging Review  Dg C-arm 1-60 Min-no Report  Result Date: 12/13/2016 Fluoroscopy was utilized by the requesting physician.  No radiographic interpretation.   Note: Imaging results reviewed.          Meds  The patient has a current medication list which includes the following prescription(s): albuterol, apixaban, aspirin ec, bisacodyl, vitamin d, colchicine, furosemide, levocetirizine, lisinopril, oxycodone hcl, oxycodone hcl, oxygen-helium, polyethylene glycol, ropinirole, and oxycodone hcl.  Current Outpatient Prescriptions on File Prior to Visit  Medication Sig  . albuterol (PROVENTIL) (2.5 MG/3ML) 0.083% nebulizer solution Take 3 mLs (2.5 mg total) by nebulization every 8 (eight) hours as needed. Shortness of breath and wheezing  . apixaban (ELIQUIS) 5 MG TABS tablet Take 1 tablet (5 mg total) by mouth 2 (two) times daily.  Marland Kitchen aspirin EC 325 MG tablet Take 325 mg by mouth daily.   . bisacodyl (DULCOLAX) 5 MG EC tablet Take 2 tablets (10 mg total) by mouth daily as needed for moderate constipation.  . Cholecalciferol (VITAMIN D)  2000 units tablet Take 2,000 Units by mouth daily.   . furosemide (LASIX) 40 MG tablet Take 1 tablet (40 mg total) by mouth daily.  Marland Kitchen levocetirizine (XYZAL) 5 MG tablet Take 1 tablet (5 mg total) by mouth every evening.  Marland Kitchen lisinopril (PRINIVIL,ZESTRIL) 10 MG tablet TAKE 1 TABLET BY MOUTH ONCE DAILY.  Derrill Memo ON 02/11/2017] Oxycodone HCl 20 MG TABS Take 1 tablet (20 mg total) by mouth every 8 (eight) hours as needed.  . Oxycodone HCl 20 MG TABS Take 1 tablet (20 mg total) by mouth every 8 (eight) hours as needed.  . OXYGEN Inhale 2 Doses into the lungs at bedtime.  . polyethylene glycol (MIRALAX / GLYCOLAX) packet Take 17 g by mouth daily as needed.  Marland Kitchen rOPINIRole (REQUIP) 2 MG tablet Take 1 tablet (2 mg total) by mouth at bedtime.  . Oxycodone HCl 20 MG TABS Take 1 tablet (20 mg total) by mouth every 8 (eight) hours as needed.   No current facility-administered medications on file prior to visit.    ROS  Constitutional: Denies any fever or chills Gastrointestinal: No reported hemesis, hematochezia, vomiting, or acute GI distress Musculoskeletal: Denies any acute onset joint swelling, redness, loss of ROM, or weakness Neurological: No reported episodes of acute onset apraxia, aphasia, dysarthria, agnosia, amnesia, paralysis, loss of coordination, or loss of consciousness  Allergies  Ms. Everingham is allergic to cyclobenzaprine; flexeril [cyclobenzaprine hcl]; and vancomycin.  PFSH  Drug: Ms. Hinde  reports that she does not use drugs. Alcohol:  reports that she does not drink alcohol. Tobacco:  reports that she quit smoking about 18 years ago. Her smoking use included Cigarettes. She has a 50.00 pack-year smoking history. She has never used smokeless tobacco. Medical:  has a past medical history of Arthritis; Arthritis; Atrial fibrillation ([place]); Back pain; Bradycardia; Breast cancer ([place]) (2004); Cancer [facility]) (2004); CHF (congestive heart failure) ([place]); Chronic back pain; CKD (chronic kidney  disease); COPD (chronic obstructive pulmonary disease) ([place]); Cystocele; Decubitus ulcers; Gout; Hematuria; Hepatitis C; Hyperlipidemia; Hypertension; Hypomagnesemia (06/25/2015); OAB (overactive bladder); Overactive bladder; Restless leg; Shoulder pain, bilateral; Urinary frequency; UTI (lower urinary tract infection); and Vaginal atrophy. Family: family history includes Breast cancer in her paternal aunt; Breast cancer (age of onset: 85) in her sister; Cancer in her sister; Heart disease in her father and mother; Kidney disease in her brother; Stroke in her mother.  Past Surgical History:  Procedure Laterality Date  . ABDOMINAL HYSTERECTOMY    . BACK SURGERY    . BREAST SURGERY     rt lumpectomy  . CARPAL TUNNEL RELEASE    . CATARACT EXTRACTION W/PHACO  10/19/2011   Procedure: CATARACT EXTRACTION PHACO AND INTRAOCULAR LENS PLACEMENT (IOC);  Surgeon: Elta Guadeloupe T. Gershon Crane, MD;  Location: [facility];  Service: Ophthalmology;  Laterality: Right;  CDE:10.81  . CATARACT EXTRACTION W/PHACO  11/02/2011   Procedure: CATARACT EXTRACTION PHACO AND INTRAOCULAR LENS PLACEMENT (IOC);  Surgeon: Elta Guadeloupe T. Gershon Crane, MD;  Location: [facility];  Service: Ophthalmology;  Laterality: Left;  CDE 8.60  . CHOLECYSTECTOMY     3/18  . COLON SURGERY    . JOINT REPLACEMENT     bilateral TKA  . PACEMAKER INSERTION N/A 12/24/2015   Procedure: INSERTION PACEMAKER;  Surgeon: Isaias Cowman, MD;  Location: [facility];  Service: Cardiovascular;  Laterality: N/A;  . ROTATOR CUFF REPAIR     bilateral   Constitutional Exam  General appearance: Well nourished, well developed, and well hydrated. In no apparent acute distress Vitals:   01/24/17 1306  BP: 114/67  Pulse: 66  Resp: 18  Temp: 98.4 F (36.9 C)  SpO2: 100%  Weight: 161 lb (73 kg)  Height: '5\' 8"'  (1.727 m)   BMI Assessment: Estimated body mass index is 24.48 kg/m as calculated from the following:   Height as of this encounter: '5\' 8"'  (1.727 m).   Weight as of this  encounter: 161 lb (73 kg).  BMI interpretation table: BMI level Category Range association with higher incidence of chronic pain  <18 kg/m2 Underweight   18.5-24.9 kg/m2 Ideal body weight   25-29.9 kg/m2 Overweight Increased incidence by 20%  30-34.9 kg/m2 Obese (Class I) Increased incidence by 68%  35-39.9 kg/m2 Severe obesity (Class II) Increased incidence by 136%  >40 kg/m2 Extreme obesity (Class III) Increased incidence by 254%   BMI Readings from Last 4 Encounters:  01/24/17 24.48 kg/m  01/24/17 24.23 kg/m  12/24/16 24.56 kg/m  12/23/16 23.92 kg/m   Wt Readings from Last 4 Encounters:  01/24/17 161 lb (73 kg)  01/24/17 159 lb 6 oz (72.3 kg)  12/24/16 161 lb 8 oz (73.3 kg)  12/23/16 162 lb (73.5 kg)  Psych/Mental status: Alert, oriented x 3 (person, place, & time)       Eyes: PERLA Respiratory: No evidence of acute respiratory distress  Cervical Spine Exam  Inspection: No masses, redness, or swelling Alignment: Symmetrical Functional ROM: Unrestricted ROM      Stability: No instability detected Muscle strength & Tone: Functionally intact Sensory: Unimpaired Palpation: No  palpable anomalies              Upper Extremity (UE) Exam    Side: Right upper extremity  Side: Left upper extremity  Inspection: No masses, redness, swelling, or asymmetry. No contractures  Inspection: No masses, redness, swelling, or asymmetry. No contractures  Functional ROM: Unrestricted ROM          Functional ROM: Unrestricted ROM          Muscle strength & Tone: Functionally intact  Muscle strength & Tone: Functionally intact  Sensory: Unimpaired  Sensory: Unimpaired  Palpation: No palpable anomalies              Palpation: No palpable anomalies              Specialized Test(s): Deferred         Specialized Test(s): Deferred          Thoracic Spine Exam  Inspection: No masses, redness, or swelling Alignment: Symmetrical Functional ROM: Unrestricted ROM Stability: No instability  detected Sensory: Unimpaired Muscle strength & Tone: No palpable anomalies  Lumbar Spine Exam  Inspection: No masses, redness, or swelling Alignment: Symmetrical Functional ROM: Decreased ROM      Stability: No instability detected Muscle strength & Tone: Functionally intact Sensory: Movement-associated pain Palpation: Tender       Provocative Tests: Lumbar Hyperextension and rotation test: evaluation deferred today       Patrick's Maneuver: evaluation deferred today                    Gait & Posture Assessment  Ambulation: Patient ambulates using a cane Gait: Age-related, senile gait pattern Posture: Thoracic kyphosis   Lower Extremity Exam    Side: Right lower extremity  Side: Left lower extremity  Inspection: No masses, redness, swelling, or asymmetry. No contractures  Inspection: No masses, redness, swelling, or asymmetry. No contractures  Functional ROM: Unrestricted ROM          Functional ROM: Unrestricted ROM          Muscle strength & Tone: Functionally intact  Muscle strength & Tone: Functionally intact  Sensory: Unimpaired  Sensory: Unimpaired  Palpation: No palpable anomalies  Palpation: No palpable anomalies   Assessment  Primary Diagnosis & Pertinent Problem List: The primary encounter diagnosis was Chronic low back pain (Location of Primary Source of Pain) (Bilateral) (L>R). 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  Status Diagnosis  Unimproved Controlled Controlled 1. Chronic low back pain (Location of Primary Source of Pain) (Bilateral) (L>R)   2. Lumbar facet syndrome (Bilateral) (L>R)   3. Chronic sacroiliac joint pain (Left)   4. Chronic pain syndrome   5. Epidural fibrosis   6. 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 Plan of Care  Pharmacotherapy (Medications Ordered): No orders of the defined types were placed in this encounter.  New Prescriptions   No medications on file   Medications administered today: Ms. Krass had no medications administered during this visit. Lab-work, procedure(s), and/or referral(s): No orders of the defined types were placed in this encounter.  Imaging and/or referral(s): None  Interventional therapies: Planned, scheduled, and/or pending:   None at this point.   Considering:   Possible bilateral lumbar facet radiofrequency. (Left side done on  12/13/2016) Palliative bilateral lumbar facet block  Diagnostic right intra-articular shoulder joint injection   Diagnostic right suprascapular nerve block   Possible right suprascapular radiofrequency ablation.    Palliative PRN treatment(s):   Palliative bilateral lumbar facet block  Diagnostic right intra-articular shoulder joint injection   Diagnostic right suprascapular nerve block   Possible right suprascapular radiofrequency ablation.    Provider-requested follow-up: Return for Med-Mgmt, w/ NP.  Future Appointments Date Time Provider [place]  03/01/2017 11:00 AM Vevelyn Francois, NP [facility] None  03/17/2017 11:00 AM Mikey College, NP [facility] None  04/27/2017 10:20 AM Alisa Graff, FNP [facility] None   Primary Care Physician: Mikey College, NP Location: [facility] Note by: Kathlen Brunswick Dossie Arbour, M.D, DABA, DABAPM, DABPM, DABIPP, FIPP Date: 01/24/2017; Time: 4:19 PM  Patient instructions provided during this appointment: Patient Instructions    ____________________________________________________________________________________________  Appointment Policy  It is our goal and responsibility to provide the medical community with assistance in the evaluation and management of  patients with chronic pain. Unfortunately our resources are limited. Because we do not have an unlimited amount of time, or available appointments, we are required to closely monitor and manage their use. The following rules exist to maximize their use:  Patient's responsibilities: 1. Punctuality: You are required to be physically present and registered in our facility at least 30 minutes before your appointment. 2. Tardiness: The cutoff is your appointment time. If you have an appointment scheduled for 10:00 AM and you arrive at 10:01, you will be required to reschedule your appointment.  3. Plan ahead: Always assume that you will encounter traffic on your way in. Plan for it. If you are dependent on a driver, make sure they understand these rules and the need to arrive early. 4. Other appointments and responsibilities: Avoid scheduling any other appointments before or after your pain clinic appointments.  5. Be prepared: Write down everything that you need to discuss with your healthcare provider and give this information to the admitting nurse. Write down the medications that you will need refilled. Bring your pills and bottles (even the empty ones), to all of your appointments, except for those where a procedure is scheduled. 6. No children or pets: Find someone to take care of them. It is not appropriate to bring them in. 7. Scheduling changes: We request "advanced notification" of any changes or cancellations. 8. Advanced notification: Defined as a time period of more than 24 hours prior to the originally scheduled appointment. This allows for the appointment to be offered to other patients. 9. Rescheduling: When a visit is rescheduled, it will require the cancellation of the original appointment. For this reason they both fall within the category of "Cancellations".  10. Cancellations: They require advanced notification. Any cancellation less than 24 hours before the  appointment will be recorded  as a "No Show". 11. No Show: Defined as an unkept appointment where the patient failed to notify or declare to the practice their intention or inability to keep the appointment.  Corrective process for repeat offenders:  1. Tardiness: Three (3) episodes of rescheduling due to late arrivals will be recorded as one (1) "No Show". 2. Cancellation or reschedule: Three (3) cancellations or rescheduling will be recorded as one (1) "No Show". 3. "No Shows": Three (3) "No Shows" within a 12 month period will result in discharge from the practice.  ____________________________________________________________________________________________   ____________________________________________________________________________________________  Medication Rules  Applies to: All patients receiving prescriptions (written or  electronic).  Pharmacy of record: Pharmacy where electronic prescriptions will be sent. If written prescriptions are taken to a different pharmacy, please inform the nursing staff. The pharmacy listed in the electronic medical record should be the one where you would like electronic prescriptions to be sent.  Prescription refills: Only during scheduled appointments. Applies to both, written and electronic prescriptions.  NOTE: The following applies primarily to controlled substances (Opioid Pain Medications)  Patient's responsibilities: 1. Pain Pills: Bring all pain pills to every appointment (except for procedure appointments). 2. Pill Bottles: Bring pills in original pharmacy bottle. Always bring newest bottle. Bring bottle, even if empty. 3. Medication refills: You are responsible for knowing and keeping track of what medications you need refilled. The day before your appointment, write a list of all prescriptions that need to be refilled. Bring that list to your appointment and give it to the admitting nurse. Prescriptions will be written only during appointments. If you forget a  medication, it will not be "Called in", "Faxed", or "electronically sent". You will need to get another appointment to get these prescribed. 4. Prescription Accuracy: You are responsible for carefully inspecting your prescriptions before leaving our office. Have the discharge nurse carefully go over each prescription with you, before taking them home. Make sure that your name is accurately spelled, that your address is correct. Check the name and dose of your medication to make sure it is accurate. Check the number of pills, and the written instructions to make sure they are clear and accurate. Make sure that you are given enough medication to last until your next medication refill appointment. 5. Taking Medication: Take medication as prescribed. Never take more pills than instructed. Never take medication more frequently than prescribed. Taking less pills or less frequently is permitted and encouraged, when it comes to controlled substances (written prescriptions).  6. Inform other Doctors: Always inform, all of your healthcare providers, of all the medications you take. 7. Pain Medication from other Providers: You are not allowed to accept any additional pain medication from any other Doctor or Healthcare provider. There are two exceptions to this rule. (see below) In the event that you require additional pain medication, you are responsible for notifying us, as stated below. 8. Medication Agreement: You are responsible for carefully reading and following our Medication Agreement. This must be signed before receiving any prescriptions from our practice. Safely store a copy of your signed Agreement. Violations to the Agreement will result in no further prescriptions. (Additional copies of our Medication Agreement are available upon request.) 9. Laws, Rules, & Regulations: All patients are expected to follow all Federal and [employer], [employer], Rules, [employer]. Ignorance of the Laws does not constitute a  valid excuse.  Exceptions: There are only two exceptions to the rule of not receiving pain medications from other Healthcare Providers. 1. Exception #1 (Emergencies): In the event of an emergency (i.e.: accident requiring emergency care), you are allowed to receive additional pain medication. However, you are responsible for: As soon as you are able, call our office [phone], at any time of the day or night, and leave a message stating your name, the date and nature of the emergency, and the name and dose of the medication prescribed. In the event that your call is answered by a member of our staff, make sure to document and save the date, time, and the name of the person that took your information.  2. Exception #2 (Planned Surgery): In the event that  you are scheduled by another doctor or dentist to have any type of surgery or procedure, you are allowed (for a period no longer than 30 days), to receive additional pain medication, for the acute post-op pain. However, in this case, you are responsible for picking up a copy of our "Post-op Pain Management for Surgeons" handout, and giving it to your surgeon or dentist. This document is available at our office, and does not require an appointment to obtain it. Simply go to our office during business hours (Monday-Thursday from 8:00 AM to 4:00 PM) (Friday 8:00 AM to 12:00 Noon) or if you have a scheduled appointment with [name], prior to your surgery, and ask for it by name. In addition, you will need to provide [name] with your name, name of your surgeon, type of surgery, and date of procedure or surgery.  ____________________________________________________________________________________________ ____________________________________________________________________________________________  Pain Scale  Introduction: The pain score used by this practice is the Verbal Numerical Rating Scale (VNRS-11). This is an 11-point scale. It is for adults and children 10  years or older. There are significant differences in how the pain score is reported, used, and applied. Forget everything you learned in the past and learn this scoring system.  General Information: The scale should reflect your current level of pain. Unless you are specifically asked for the level of your worst pain, or your average pain. If you are asked for one of these two, then it should be understood that it is over the past 24 hours.  Basic Activities of Daily Living (ADL): Personal hygiene, dressing, eating, transferring, and using restroom.  Instructions: Most patients tend to report their level of pain as a combination of two factors, their physical pain and their psychosocial pain. This last one is also known as "suffering" and it is reflection of how physical pain affects you socially and psychologically. From now on, report them separately. From this point on, when asked to report your pain level, report only your physical pain. Use the following table for reference.  Pain Clinic Pain Levels (0-5/10)  Pain Level Score  Description  No Pain 0   Mild pain 1 Nagging, annoying, but does not interfere with basic activities of daily living (ADL). Patients are able to eat, bathe, get dressed, toileting (being able to get on and off the toilet and perform personal hygiene functions), transfer (move in and out of bed or a chair without assistance), and maintain continence (able to control bladder and bowel functions). Blood pressure and heart rate are unaffected. A normal heart rate for a healthy adult ranges from 60 to 100 bpm (beats per minute).   Mild to moderate pain 2 Noticeable and distracting. Impossible to hide from other people. More frequent flare-ups. Still possible to adapt and function close to normal. It can be very annoying and may have occasional stronger flare-ups. With discipline, patients may get used to it and adapt.   Moderate pain 3 Interferes significantly with activities of  daily living (ADL). It becomes difficult to feed, bathe, get dressed, get on and off the toilet or to perform personal hygiene functions. Difficult to get in and out of bed or a chair without assistance. Very distracting. With effort, it can be ignored when deeply involved in activities.   Moderately severe pain 4 Impossible to ignore for more than a few minutes. With effort, patients may still be able to manage work or participate in some social activities. Very difficult to concentrate. Signs of autonomic nervous system discharge  are evident: dilated pupils (mydriasis); mild sweating (diaphoresis); sleep interference. Heart rate becomes elevated (>115 bpm). Diastolic blood pressure (lower number) rises above 100 mmHg. Patients find relief in laying down and not moving.   Severe pain 5 Intense and extremely unpleasant. Associated with frowning face and frequent crying. Pain overwhelms the senses.  Ability to do any activity or maintain social relationships becomes significantly limited. Conversation becomes difficult. Pacing back and forth is common, as getting into a comfortable position is nearly impossible. Pain wakes you up from deep sleep. Physical signs will be obvious: pupillary dilation; increased sweating; goosebumps; brisk reflexes; cold, clammy hands and feet; nausea, vomiting or dry heaves; loss of appetite; significant sleep disturbance with inability to fall asleep or to remain asleep. When persistent, significant weight loss is observed due to the complete loss of appetite and sleep deprivation.  Blood pressure and heart rate becomes significantly elevated. Caution: If elevated blood pressure triggers a pounding headache associated with blurred vision, then the patient should immediately seek attention at an urgent or emergency care unit, as these may be signs of an impending stroke.    Emergency Department Pain Levels (6-10/10)  Emergency Room Pain 6 Severely limiting. Requires emergency  care and should not be seen or managed at an outpatient pain management facility. Communication becomes difficult and requires great effort. Assistance to reach the emergency department may be required. Facial flushing and profuse sweating along with potentially dangerous increases in heart rate and blood pressure will be evident.   Distressing pain 7 Self-care is very difficult. Assistance is required to transport, or use restroom. Assistance to reach the emergency department will be required. Tasks requiring coordination, such as bathing and getting dressed become very difficult.   Disabling pain 8 Self-care is no longer possible. At this level, pain is disabling. The individual is unable to do even the most "basic" activities such as walking, eating, bathing, dressing, transferring to a bed, or toileting. Fine motor skills are lost. It is difficult to think clearly.   Incapacitating pain 9 Pain becomes incapacitating. Thought processing is no longer possible. Difficult to remember your own name. Control of movement and coordination are lost.   The worst pain imaginable 10 At this level, most patients pass out from pain. When this level is reached, collapse of the autonomic nervous system occurs, leading to a sudden drop in blood pressure and heart rate. This in turn results in a temporary and dramatic drop in blood flow to the brain, leading to a loss of consciousness. Fainting is one of the body's self defense mechanisms. Passing out puts the brain in a calmed state and causes it to shut down for a while, in order to begin the healing process.    Summary: 1. Refer to this scale when providing [name] with your pain level. 2. Be accurate and careful when reporting your pain level. This will help with your care. 3. Over-reporting your pain level will lead to loss of credibility. 4. Even a level of 1/10 means that there is pain and will be treated at our facility. 5. High, inaccurate reporting will be  documented as "Symptom Exaggeration", leading to loss of credibility and suspicions of possible secondary gains such as obtaining more narcotics, or wanting to appear disabled, for fraudulent reasons. 6. Only pain levels of 5 or below will be seen at our facility. 7. Pain levels of 6 and above will be sent to the Emergency Department and the appointment cancelled. ____________________________________________________________________________________________  ____________________________________________________________________________________________  Post-Procedure instructions Instructions:  Apply ice: Fill a plastic sandwich bag with crushed ice. Cover it with a small towel and apply to injection site. Apply for 15 minutes then remove x 15 minutes. Repeat sequence on day of procedure, until you go to bed. The purpose is to minimize swelling and discomfort after procedure.  Apply heat: Apply heat to procedure site starting the day following the procedure. The purpose is to treat any soreness and discomfort from the procedure.  Food intake: Start with clear liquids (like water) and advance to regular food, as tolerated.   Physical activities: Keep activities to a minimum for the first 8 hours after the procedure.   Driving: If you have received any sedation, you are not allowed to drive for 24 hours after your procedure.  Blood thinner: Restart your blood thinner 6 hours after your procedure. (Only for those taking blood thinners)  Insulin: As soon as you can eat, you may resume your normal dosing schedule. (Only for those taking insulin)  Infection prevention: Keep procedure site clean and dry.  Post-procedure Pain Diary: Extremely important that this be done correctly and accurately. Recorded information will be used to determine the next step in treatment.  Pain evaluated is that of treated area only. Do not include pain from an untreated area.  Complete every hour, on the hour, for  the initial 8 hours. Set an alarm to help you do this part accurately.  Do not go to sleep and have it completed later. It will not be accurate.  Follow-up appointment: Keep your follow-up appointment after the procedure. Usually 2 weeks for most procedures. (6 weeks in the case of radiofrequency.) Bring you pain diary.  Expect:  From numbing medicine (AKA: Local Anesthetics): Numbness or decrease in pain.  Onset: Full effect within 15 minutes of injected.  Duration: It will depend on the type of local anesthetic used. On the average, 1 to 8 hours.   From steroids: Decrease in swelling or inflammation. Once inflammation is improved, relief of the pain will follow.  Onset of benefits: Depends on the amount of swelling present. The more swelling, the longer it will take for the benefits to be seen.   Duration: Steroids will stay in the system x 2 weeks. Duration of benefits will depend on multiple posibilities including persistent irritating factors.  From procedure: Some discomfort is to be expected once the numbing medicine wears off. This should be minimal if ice and heat are applied as instructed. Call if:  You experience numbness and weakness that gets worse with time, as opposed to wearing off.  New onset bowel or bladder incontinence. (Spinal procedures only)  Emergency Numbers:  [place] business hours (Monday - Thursday, 8:00 AM - 4:00 PM) (Friday, 9:00 AM - 12:00 Noon): [phone]  After hours: [phone] ____________________________________________________________________________________________

## 2017-01-24 NOTE — Patient Instructions (Addendum)
Continue weighing daily and call for an overnight weight gain of > 2 pounds or a weekly weight gain of >5 pounds.  Keep fluid intake around 60 ounces daily

## 2017-01-24 NOTE — Progress Notes (Signed)
Safety precautions to be maintained throughout the outpatient stay will include: orient to surroundings, keep bed in low position, maintain call bell within reach at all times, provide assistance with transfer out of bed and ambulation.  

## 2017-01-24 NOTE — Progress Notes (Signed)
[name], female    DOB: Apr 27, 1930, 83 y.o.   MRN: [id number]  HPI  Kristin [name] is a 83 y/o female with a history of atrial fibrillation, right breast cancer, chronic back pain, CKD, COPD, gout, hepatitis, hyperlipidemia, HTN, hypomagnesium and chronic heart failure  Reviewed echo report on 10/29/15 which showed an EF of 55-65% with moderate MR.   Admitted 11/16/16 due to cholecystitis and e coli bacteremia. Treated with IV antibiotics and then transitioned to oral antibiotics. Had ERCP done with stone removal prior to lap choley. Discharged home. Was in the ED 08/22/16 due to shoulder pain after taking down Christmas lights. Evaluated and discharged home.   She presents today for her follow-up visit with a chief complaint of moderate fatigue with little exertion. She describes it as chronic in nature and has been present for many years. Does improve with rest. Has associated cough, shortness of breath and left foot swelling with this.   Past Medical History:  Diagnosis Date  . Arthritis   . Arthritis   . Atrial fibrillation ([place])   . Back pain    lower back chronic  . Bradycardia   . Breast cancer ([place]) 2004   right breast cancer  . Cancer [facility]) 2004   rt breast cancer-post lumpectomy- chemo/rad  . CHF (congestive heart failure) ([place])   . Chronic back pain   . CKD (chronic kidney disease)   . COPD (chronic obstructive pulmonary disease) ([facility])    wears O2 at 2L via  at night  . Cystocele   . Decubitus ulcers   . Gout   . Hematuria   . Hepatitis C   . Hyperlipidemia   . Hypertension   . Hypomagnesemia 06/25/2015  . OAB (overactive bladder)   . Overactive bladder   . Restless leg   . Shoulder pain, bilateral   . Urinary frequency   . UTI (lower urinary tract infection)   . Vaginal atrophy    Past Surgical History:  Procedure Laterality Date  . ABDOMINAL HYSTERECTOMY    . BACK SURGERY    . BREAST SURGERY     rt lumpectomy  . CARPAL TUNNEL RELEASE    .  CATARACT EXTRACTION W/PHACO  10/19/2011   Procedure: CATARACT EXTRACTION PHACO AND INTRAOCULAR LENS PLACEMENT (IOC);  Surgeon: Elta Guadeloupe T. Gershon Crane, MD;  Location: [facility];  Service: Ophthalmology;  Laterality: Right;  CDE:10.81  . CATARACT EXTRACTION W/PHACO  11/02/2011   Procedure: CATARACT EXTRACTION PHACO AND INTRAOCULAR LENS PLACEMENT (IOC);  Surgeon: Elta Guadeloupe T. Gershon Crane, MD;  Location: [facility];  Service: Ophthalmology;  Laterality: Left;  CDE 8.60  . CHOLECYSTECTOMY     3/18  . COLON SURGERY    . JOINT REPLACEMENT     bilateral TKA  . PACEMAKER INSERTION N/A 12/24/2015   Procedure: INSERTION PACEMAKER;  Surgeon: Isaias Cowman, MD;  Location: [facility] ORS;  Service: Cardiovascular;  Laterality: N/A;  . ROTATOR CUFF REPAIR     bilateral   Family History  Problem Relation Age of Onset  . Kidney disease Brother   . Heart disease Father   . Heart disease Mother   . Stroke Mother   . Cancer Sister   . Breast cancer Sister 1  . Breast cancer Paternal Aunt   . Anesthesia problems Neg Hx   . Hypotension Neg Hx   . Malignant hyperthermia Neg Hx   . Pseudochol deficiency Neg Hx    Social History  Substance Use Topics  . Smoking  status: Former Smoker    Packs/day: 1.25    Years: 40.00    Types: Cigarettes    Quit date: 12/16/1998  . Smokeless tobacco: Never Used  . Alcohol use No   Allergies  Allergen Reactions  . Cyclobenzaprine Rash and Hives    Severe Rash  . Flexeril [Cyclobenzaprine Hcl]     Severe Rash  . Vancomycin Rash    [place] Syndrome   Prior to Admission medications   Medication Sig Start Date End Date Taking? Authorizing Provider  albuterol (PROVENTIL) (2.5 MG/3ML) 0.083% nebulizer solution Take 3 mLs (2.5 mg total) by nebulization every 8 (eight) hours as needed. Shortness of breath and wheezing 02/19/16  Yes Arlis Porta., MD  apixaban (ELIQUIS) 5 MG TABS tablet Take 1 tablet (5 mg total) by mouth 2 (two) times daily. 10/30/15  Yes Wieting, [name], MD  aspirin EC  325 MG tablet Take 325 mg by mouth daily.    Yes [provider]  bisacodyl (DULCOLAX) 5 MG EC tablet Take 2 tablets (10 mg total) by mouth daily as needed for moderate constipation. 12/13/16 03/13/17 Yes Milinda Pointer, MD  Cholecalciferol (VITAMIN D) 2000 units tablet Take 2,000 Units by mouth daily.    Yes [provider]  furosemide (LASIX) 40 MG tablet Take 1 tablet (40 mg total) by mouth daily. 12/10/16  Yes Mikey College, NP  levocetirizine (XYZAL) 5 MG tablet Take 1 tablet (5 mg total) by mouth every evening. 06/22/16  Yes Arlis Porta., MD  lisinopril (PRINIVIL,ZESTRIL) 10 MG tablet TAKE 1 TABLET BY MOUTH ONCE DAILY. 09/10/16  Yes Arlis Porta., MD  Oxycodone HCl 20 MG TABS Take 1 tablet (20 mg total) by mouth every 8 (eight) hours as needed. 02/11/17 03/13/17 Yes Milinda Pointer, MD  Oxycodone HCl 20 MG TABS Take 1 tablet (20 mg total) by mouth every 8 (eight) hours as needed. 12/13/16 01/12/17 Yes Milinda Pointer, MD  Oxycodone HCl 20 MG TABS Take 1 tablet (20 mg total) by mouth every 8 (eight) hours as needed. 01/12/17 02/11/17 Yes Milinda Pointer, MD  OXYGEN Inhale 2 Doses into the lungs at bedtime.   Yes [provider]  polyethylene glycol (MIRALAX / GLYCOLAX) packet Take 17 g by mouth daily as needed. 11/22/16  Yes [provider]  rOPINIRole (REQUIP) 2 MG tablet Take 1 tablet (2 mg total) by mouth at bedtime. 12/13/16 03/13/17 Yes Milinda Pointer, MD  Colchicine 0.6 MG CAPS Take 2 pills followed by 1 pill in 1 hour at the start of your gout flare. Patient not taking: Reported on 12/24/2016 12/23/16   Mikey College, NP     Review of Systems  Constitutional: Positive for fatigue. Negative for appetite change.  HENT: Positive for congestion (in the mornings). Negative for postnasal drip and sore throat.   Eyes: Negative.   Respiratory: Positive for cough (in the mornings) and shortness of breath (walking up stairs and  walking long distantces). Negative for chest tightness.   Cardiovascular: Negative for chest pain, palpitations and leg swelling.  Gastrointestinal: Negative for abdominal distention and abdominal pain.  Endocrine: Negative.   Genitourinary: Negative.   Musculoskeletal: Positive for arthralgias (left great toe) and back pain. Negative for neck stiffness.  Skin: Negative.   Allergic/Immunologic: Negative.   Neurological: Negative for dizziness and light-headedness.  Hematological: Negative for adenopathy. Bruises/bleeds easily.  Psychiatric/Behavioral: Positive for sleep disturbance (sleeping on 1 pillow; wearing oxygen @ 2L at bedtime). Negative for dysphoric  mood. The patient is not nervous/anxious.    Vitals:   01/24/17 1037  BP: 131/73  Pulse: 88  Resp: 20  SpO2: 94%  Weight: 159 lb 6 oz (72.3 kg)  Height: 5\' 8"  (1.727 m)   Wt Readings from Last 3 Encounters:  01/24/17 159 lb 6 oz (72.3 kg)  12/24/16 161 lb 8 oz (73.3 kg)  12/23/16 162 lb (73.5 kg)   Lab Results  Component Value Date   CREATININE 0.98 12/24/2016   CREATININE 1.26 (H) 08/31/2016   CREATININE 1.16 (H) 03/02/2016    Physical Exam  Constitutional: She is oriented to person, place, and time. She appears well-developed and well-nourished.  HENT:  Head: Normocephalic and atraumatic.  Neck: Normal range of motion. Neck supple. No JVD present.  Cardiovascular: Normal rate and regular rhythm.   Pulmonary/Chest: Effort normal. She has no wheezes. She has no rales.  Abdominal: Soft. She exhibits no distension. There is no tenderness.  Musculoskeletal: She exhibits edema (1+ pitting edema in left foot). She exhibits no tenderness.  Neurological: She is alert and oriented to person, place, and time.  Skin: Skin is warm and dry.  Psychiatric: She has a normal mood and affect. Her behavior is normal. Thought content normal.  Nursing note and vitals reviewed.  Assessment & Plan:  1: Chronic heart failure with  preserved ejection fraction- - NYHA class III - euvolemic today - already weighing daily and home weight ranges from 159-164 pounds. Reminded to call for an overnight weight gain of >2 pounds or a weekly weight gain of >5 pounds - does add salt when cooking but none afterwards. Reviewed the importance of not adding any salt to her food and to follow a 2000mg  sodium diet.  - elevate legs as much as possible - saw cardiologist (Kristin [name]) 12/14/16 and returns to him 02/02/17 - reports having an echocardiogram done on 01/21/17 - drinking "lots" of fluid but unable to quantify amount. Says that she's drinking 6-8 cups of fluid daily and she's guessing that her cup holds 12 ounces. Advised her to measure how much that cup holds and that she should stay closer to 60 ounces of fluid daily - wearing oxygen at 2L at bedtime  2: HTN-  - BP looks good today - checks it twice daily at home. BP record reviewed - sees PCP Kristin [name]) end of June 2018   Medication list was reviewed.  Return here in 3 months or sooner for any questions/problems before then.

## 2017-01-31 ENCOUNTER — Ambulatory Visit: Payer: Medicare Other | Admitting: Nurse Practitioner

## 2017-02-02 DIAGNOSIS — E785 Hyperlipidemia, unspecified: Secondary | ICD-10-CM | POA: Insufficient documentation

## 2017-02-07 ENCOUNTER — Other Ambulatory Visit: Payer: Self-pay | Admitting: Pharmacist

## 2017-02-07 NOTE — Patient Outreach (Signed)
[facility]) Care Management  02/07/2017  Kristin [name] September 20, 1929 [id number]  Third phone outreach attempt to patient, patient answered phone.   Purpose of call to follow-up with patient regarding medication concerns.  When patient answered call in April 2018, she stated she "took care of" cost issue for Eliquis and stated she needed a call back later due to being busy at time of call.    Multiple attempts to reach patient since then were made, with no return call from patient.   Today patient denies medication related concerns.  She did not wish to review her medications in detail over the phone.  She states she is not having cost concerns at this time.  She denies having questions about her medications at this time.   Plan:  Will close pharmacy case as patient denies pharmacy related concerns at this time.   Will update [facility] RN Maudie Mercury via in-basket of pharmacy case closure.   Karrie Meres, PharmD, [place] [phone]

## 2017-02-08 ENCOUNTER — Other Ambulatory Visit: Payer: Self-pay | Admitting: *Deleted

## 2017-02-24 ENCOUNTER — Ambulatory Visit: Payer: Medicare Other | Admitting: *Deleted

## 2017-02-28 NOTE — Progress Notes (Addendum)
Patient's Name: Kristin [name]  MRN: [id number]  Referring Provider: Mikey College, *  DOB: May 26, 1930  PCP: Mikey College, NP  DOS: 03/01/2017  Note by: Vevelyn Francois NP  Service setting: Ambulatory outpatient  Specialty: Interventional Pain Management  Location: [facility] (AMB) Pain Management Facility    Patient type: Established    Primary Reason(s) for Visit: Encounter for prescription drug management. (Level of risk: moderate)  CC: Back Pain (low); Shoulder Pain (bilateral); and Neck Pain  HPI  Kristin [name] is a 83 y.o. year old, female patient, who comes today for a medication management evaluation. She has Chronic kidney disease (CKD), stage III (moderate); DDD (degenerative disc disease), lumbosacral; Gout; Benign essential HTN; Arrhythmia, sinus node; Lumbar canal stenosis; Chronic obstructive pulmonary disease ([place]); Chemical diabetes; History of cardiac catheterization; Seasonal allergies; History of breast cancer; Chronic pain syndrome; Chronic low back pain (Location of Primary Source of Pain) (Bilateral) (L>R); Lumbar facet syndrome (Bilateral) (L>R); Lumbar spondylosis; Chronic lumbar radicular pain (Bilateral) (L>R) (L5 Dermatome); Restless leg syndrome; Myofascial pain; Neurogenic pain; Long term current use of opiate analgesic; Opiate use (90 MME/Day); Opioid-induced constipation (OIC); Vitamin D insufficiency; Grade 1 Retrolisthesis of L1 over L2; Lumbar facet hypertrophy (L1-2, L2-3, and L4-5); Stenosis of lateral recess of lumbar spine (L1-2 and L4-5); 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); Epidural fibrosis; 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); 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 Her primarily concern today is the Back Pain (low); Shoulder Pain (bilateral); and Neck Pain  Pain Assessment: Location: Lower Back Radiating:   Onset: More than a month ago Duration: Chronic pain Quality: Aching, Constant Severity: 5 /10 (self-reported pain score)  Note: Reported level is compatible with observation.                   Effect on ADL:   Timing: Constant Modifying factors:    Kristin [name] was last scheduled for an appointment on 11/11/16 for medication management. During today's appointment we reviewed Kristin [name]'s chronic pain status, as well as her outpatient medication regimen. She admits that her back pain is stable today. She is concern today by her shoulder pain. She feels this time for an injection. She admits that these have been effective in the past. The patient  reports that she does not use drugs. Her body mass index is 24.18 kg/m.  Further details on both, my assessment(s), as well as the proposed treatment plan, please see below.  Controlled Substance Pharmacotherapy Assessment REMS (Risk Evaluation and Mitigation Strategy)  Analgesic:Oxycodone IR 20 mg every 8 hours (60 mg/day) MME/day:90 mg/day Hart Rochester, RN  03/01/2017  3:16 PM  Sign at close encounter toxNursing Pain Medication Assessment:  Safety precautions to be maintained throughout the outpatient stay will include: orient to surroundings, keep bed in low position, maintain call bell within reach at all times, provide assistance with transfer out of bed and ambulation.  Medication Inspection Compliance: Pill count conducted under aseptic conditions, in front of the  patient. Neither the pills nor the bottle was removed from the patient's  sight at any time. Once count was completed pills were immediately returned to the patient in their original bottle.  Medication: Oxycodone IR Pill/Patch Count: 12 of 90 pills remain Pill/Patch Appearance: Markings consistent with prescribed medication Bottle Appearance: Standard pharmacy container. Clearly labeled. Filled Date: 06 / 22 / 2018 Last Medication intake:  Today   Pharmacokinetics: Liberation and absorption (onset of action): WNL Distribution (time to peak effect): WNL Metabolism and excretion (duration of action): WNL         Pharmacodynamics: Desired effects: Analgesia: Kristin [name] reports >50% benefit. Functional ability: Patient reports that medication allows her to accomplish basic ADLs Clinically meaningful improvement in function (CMIF): Sustained CMIF goals met Perceived effectiveness: Described as relatively effective, allowing for increase in activities of daily living (ADL) Undesirable effects: Side-effects or Adverse reactions: None reported Monitoring: [place] PMP: Online review of the past 59-monthperiod conducted. Compliant with practice rules and regulations List of all UDS test(s) done:  Lab Results  Component Value Date   TOXASSSELUR FINAL 12/18/2015   TOXASSSELUR FINAL 09/24/2015   TOXASSSELUR FINAL 06/25/2015   Last UDS on record: ToxAssure Select 13  Date Value Ref Range Status  12/18/2015 FINAL  Final    Comment:    ==================================================================== TOXASSURE SELECT 13 (MW) ==================================================================== Test                             Result       Flag       Units Drug Present and Declared for Prescription Verification   Oxycodone                      2377         EXPECTED   ng/mg creat   Oxymorphone                    2225         EXPECTED   ng/mg creat   Noroxycodone                   7596          EXPECTED   ng/mg creat   Noroxymorphone                 911          EXPECTED   ng/mg creat    Sources of oxycodone are scheduled prescription medications.    Oxymorphone, noroxycodone, and noroxymorphone are expected    metabolites of oxycodone. Oxymorphone is also available as a    scheduled prescription medication. ==================================================================== Test                      Result    Flag   Units      Ref Range   Creatinine              103              mg/dL      >=20 ==================================================================== Declared Medications:  The flagging and interpretation on this report are based on the  following declared medications.  Unexpected results may arise from  inaccuracies in the declared medications.  **Note: The testing scope of this panel includes these medications:  Oxycodone  **Note: The testing scope of this panel does not include following  reported medications:  Albuterol  Apixaban  Aspirin  Bisacodyl  Calcium citrate (Calcium citrate/Vitamin D)  Cholecalciferol  Furosemide  Levocetirizine  Lisinopril  Magnesium Oxide  Omeprazole  Oxygen  Ropinirole  Supplement  Vitamin D (Calcium citrate/Vitamin D) ==================================================================== For clinical consultation, please call [phone]. ====================================================================    UDS interpretation: Compliant          Medication Assessment Form: Discrepancies found between patient's report and information collected Treatment compliance: Deficiencies noted and steps taken to remind the patient of the seriousness of adequate therapy compliance Risk Assessment Profile: Aberrant behavior: taking more medication than prescribed Comorbid factors increasing risk of overdose: COPD or asthma and kidney disease Risk of substance use disorder (SUD): Moderate Opioid Risk Tool (ORT) Total Score:      Interpretation Table:  Score <3 = Low Risk for SUD  Score between 4-7 = Moderate Risk for SUD  Score >8 = High Risk for Opioid Abuse   Risk Mitigation Strategies:  Patient Counseling: Covered Patient-Prescriber Agreement (PPA): Present and active  Notification to other healthcare providers: Done  Pharmacologic Plan: No change in therapy, at this time  Laboratory Chemistry  Inflammation Markers (CRP: Acute Phase) (ESR: Chronic Phase) Lab Results  Component Value Date   CRP <0.5 09/24/2015   ESRSEDRATE 12 09/24/2015                 Renal Function Markers Lab Results  Component Value Date   BUN 20 12/24/2016   CREATININE 0.98 12/24/2016   GFRAA 59 (L) 12/24/2016   GFRNONAA 51 (L) 12/24/2016                 Hepatic Function Markers Lab Results  Component Value Date   AST 21 03/02/2016   ALT 10 03/02/2016   ALBUMIN 3.7 03/02/2016   ALKPHOS 74 03/02/2016                 Electrolytes Lab Results  Component Value Date   NA 139 12/24/2016   K 4.0 12/24/2016   CL 107 12/24/2016   CALCIUM 9.7 12/24/2016   MG 2.2 09/24/2015                 Neuropathy Markers No results found for: [id number]               Bone Pathology Markers Lab Results  Component Value Date   ALKPHOS 74 03/02/2016   CALCIUM 9.7 12/24/2016                 Coagulation Parameters Lab Results  Component Value Date   INR 1.24 12/16/2015   LABPROT 15.8 (H) 12/16/2015   APTT 26 12/16/2015   PLT 191 03/02/2016                 Cardiovascular Markers Lab Results  Component Value Date   HGB 13.5 03/02/2016   HCT 41.6 03/02/2016                 Note: Lab results reviewed.  Recent Diagnostic Imaging Review  Dg C-arm 1-60 Min-no Report  Result Date: 12/13/2016 Fluoroscopy was utilized by the requesting physician.  No radiographic interpretation.   Note: Imaging results reviewed.          Meds   Current Meds  Medication Sig  . albuterol (PROVENTIL) (2.5 MG/3ML) 0.083% nebulizer  solution Take 3 mLs (2.5 mg total) by nebulization every 8 (eight) hours as needed. Shortness of breath and wheezing  . apixaban (ELIQUIS) 5 MG TABS tablet Take 1 tablet (5 mg total) by mouth 2 (two) times daily.  Marland Kitchen  aspirin EC 325 MG tablet Take 325 mg by mouth daily.   . bisacodyl (DULCOLAX) 5 MG EC tablet Take 2 tablets (10 mg total) by mouth daily as needed for moderate constipation.  . Cholecalciferol (VITAMIN D) 2000 units tablet Take 2,000 Units by mouth daily.   . Colchicine 0.6 MG CAPS TAKE 2 PILLS FOLLOWED BY 1 PILL IN 1 HOUR AT THE START OF YOUR GOUT FLARE.  . furosemide (LASIX) 40 MG tablet Take 1 tablet (40 mg total) by mouth daily.  Marland Kitchen levocetirizine (XYZAL) 5 MG tablet Take 1 tablet (5 mg total) by mouth every evening.  Marland Kitchen lisinopril (PRINIVIL,ZESTRIL) 10 MG tablet TAKE 1 TABLET BY MOUTH ONCE DAILY.  Marland Kitchen OXYGEN Inhale 2 Doses into the lungs at bedtime.  . polyethylene glycol (MIRALAX / GLYCOLAX) packet Take 17 g by mouth daily as needed.  Marland Kitchen rOPINIRole (REQUIP) 2 MG tablet Take 1 tablet (2 mg total) by mouth at bedtime.  . [DISCONTINUED] rOPINIRole (REQUIP) 2 MG tablet Take 1 tablet (2 mg total) by mouth at bedtime.    ROS  Constitutional: Denies any fever or chills Gastrointestinal: No reported hemesis, hematochezia, vomiting, or acute GI distress Musculoskeletal: Denies any acute onset joint swelling, redness, loss of ROM, or weakness Neurological: No reported episodes of acute onset apraxia, aphasia, dysarthria, agnosia, amnesia, paralysis, loss of coordination, or loss of consciousness  Allergies  Ms. Hartsock is allergic to cyclobenzaprine; flexeril [cyclobenzaprine hcl]; and vancomycin.  PFSH  Drug: Ms. Leeth  reports that she does not use drugs. Alcohol:  reports that she does not drink alcohol. Tobacco:  reports that she quit smoking about 18 years ago. Her smoking use included Cigarettes. She has a 50.00 pack-year smoking history. She has never used smokeless  tobacco. Medical:  has a past medical history of Arthritis; Arthritis; Atrial fibrillation ([place]); Back pain; Bradycardia; Breast cancer ([place]) (2004); Cancer [facility]) (2004); CHF (congestive heart failure) ([place]); Chronic back pain; CKD (chronic kidney disease); COPD (chronic obstructive pulmonary disease) ([place]); Cystocele; Decubitus ulcers; Gout; Hematuria; Hepatitis C; Hyperlipidemia; Hypertension; Hypomagnesemia (06/25/2015); OAB (overactive bladder); Overactive bladder; Restless leg; Shoulder pain, bilateral; Urinary frequency; UTI (lower urinary tract infection); and Vaginal atrophy. Surgical: Ms. Charrier  has a past surgical history that includes Abdominal hysterectomy; Back surgery; Rotator cuff repair; Breast surgery; Cataract extraction w/PHACO (10/19/2011); Cataract extraction w/PHACO (11/02/2011); Carpal tunnel release; Joint replacement; Colon surgery; Pacemaker insertion (N/A, 12/24/2015); and Cholecystectomy. Family: family history includes Breast cancer in her paternal aunt; Breast cancer (age of onset: 70) in her sister; Cancer in her sister; Heart disease in her father and mother; Kidney disease in her brother; Stroke in her mother.  Constitutional Exam  General appearance: Well nourished, well developed, and well hydrated. In no apparent acute distress Vitals:   03/01/17 1118  BP: 118/73  Pulse: 64  Resp: 18  Temp: 97.9 F (36.6 C)  TempSrc: Oral  SpO2: 100%  Weight: 159 lb (72.1 kg)  Height: '5\' 8"'  (1.727 m)   BMI Assessment: Estimated body mass index is 24.18 kg/m as calculated from the following:   Height as of this encounter: '5\' 8"'  (1.727 m).   Weight as of this encounter: 159 lb (72.1 kg).  BMI interpretation table: BMI level Category Range association with higher incidence of chronic pain  <18 kg/m2 Underweight   18.5-24.9 kg/m2 Ideal body weight   25-29.9 kg/m2 Overweight Increased incidence by 20%  30-34.9 kg/m2 Obese (Class I) Increased incidence by 68%  35-39.9 kg/m2  Severe obesity (Class  II) Increased incidence by 136%  >40 kg/m2 Extreme obesity (Class III) Increased incidence by 254%   BMI Readings from Last 4 Encounters:  03/01/17 24.18 kg/m  01/24/17 24.48 kg/m  01/24/17 24.23 kg/m  12/24/16 24.56 kg/m   Wt Readings from Last 4 Encounters:  03/01/17 159 lb (72.1 kg)  01/24/17 161 lb (73 kg)  01/24/17 159 lb 6 oz (72.3 kg)  12/24/16 161 lb 8 oz (73.3 kg)  Psych/Mental status: Alert, oriented x 3 (person, place, & time)       Eyes: PERLA Respiratory: No evidence of acute respiratory distress  Cervical Spine Exam  Inspection: No masses, redness, or swelling Alignment: Symmetrical Functional ROM: Unrestricted ROM      Stability: No instability detected Muscle strength & Tone: Functionally intact Sensory: Unimpaired Palpation: No palpable anomalies              Upper Extremity (UE) Exam    Side: Right upper extremity  Side: Left upper extremity  Inspection: No masses, redness, swelling, or asymmetry. No contractures  Inspection: No masses, redness, swelling, or asymmetry. No contractures  Functional ROM: Unrestricted ROM          Functional ROM: Decreased ROM          Muscle strength & Tone: Functionally intact  Muscle strength & Tone: Movement possible against gravity, but not against resistance (3/5)  Sensory: Unimpaired  Sensory: Unimpaired  Palpation: No palpable anomalies              Palpation: No palpable anomalies              Specialized Test(s): Deferred         Specialized Test(s): Deferred          Thoracic Spine Exam  Inspection: No masses, redness, or swelling Alignment: Symmetrical Functional ROM: Unrestricted ROM Stability: No instability detected Sensory: Unimpaired Muscle strength & Tone: No palpable anomalies  Lumbar Spine Exam  Inspection: Well healed scar from previous spine surgery detected Alignment: Symmetrical Functional ROM: Unrestricted ROM      Stability: No instability detected Muscle strength &  Tone: Functionally intact Sensory: Unimpaired Palpation: Complains of area being tender to palpation       Provocative Tests: Lumbar Hyperextension and rotation test: Positive       Patrick's Maneuver: evaluation deferred today                    Gait & Posture Assessment  Ambulation: Patient ambulates using a cane Gait: Relatively normal for age and body habitus Posture: WNL   Lower Extremity Exam    Side: Right lower extremity  Side: Left lower extremity  Inspection: No masses, redness, swelling, or asymmetry. No contractures  Inspection: No masses, redness, swelling, or asymmetry. No contractures  Functional ROM: Unrestricted ROM          Functional ROM: Unrestricted ROM          Muscle strength & Tone: Functionally intact  Muscle strength & Tone: Functionally intact  Sensory: Unimpaired  Sensory: Unimpaired  Palpation: No palpable anomalies  Palpation: No palpable anomalies   Assessment  Primary Diagnosis & Pertinent Problem List: The primary encounter diagnosis was Primary osteoarthritis of right shoulder. Diagnoses of Chronic neck pain, Restless leg syndrome, Lumbar spondylosis, and Chronic pain syndrome were also pertinent to this visit.  Status Diagnosis  Controlled Controlled Controlled 1. Primary osteoarthritis of right shoulder   2. Chronic neck pain   3. Restless leg syndrome  4. Lumbar spondylosis   5. Chronic pain syndrome     Problems updated and reviewed during this visit: Problem  Chronic Neck Pain  Hyperlipidemia, Unspecified   Plan of Care  Pharmacotherapy (Medications Ordered): Meds ordered this encounter  Medications  . Oxycodone HCl 20 MG TABS    Sig: Take 1 tablet (20 mg total) by mouth every 8 (eight) hours as needed.    Dispense:  90 tablet    Refill:  0    Do not add this medication to the electronic "Automatic Refill" notification system. Patient may have prescription filled one day early if pharmacy is closed on scheduled refill date. Do not  fill until: 03/13/17 To last until: 04/12/17    Order Specific Question:   Supervising Provider    Answer:   Milinda Pointer [phone]  . Oxycodone HCl 20 MG TABS    Sig: Take 1 tablet (20 mg total) by mouth every 8 (eight) hours as needed.    Dispense:  90 tablet    Refill:  0    Do not add this medication to the electronic "Automatic Refill" notification system. Patient may have prescription filled one day early if pharmacy is closed on scheduled refill date. Do not fill until: 04/12/17 To last until: 05/12/17    Order Specific Question:   Supervising Provider    Answer:   Milinda Pointer [phone]  . rOPINIRole (REQUIP) 2 MG tablet    Sig: Take 1 tablet (2 mg total) by mouth at bedtime.    Dispense:  90 tablet    Refill:  0    Do not place this medication, or any other prescription from our practice, on "Automatic Refill". Patient may have prescription filled one day early if pharmacy is closed on scheduled refill date.    Order Specific Question:   Supervising Provider    Answer:   Milinda Pointer [[id number]]   New Prescriptions   No medications on file   Medications administered today: Ms. Wehner had no medications administered during this visit. Lab-work, procedure(s), and/or referral(s): Orders Placed This Encounter  Procedures  . SHOULDER INJECTION  . DG Cervical Spine Complete  . ToxASSURE Select 13 (MW), Urine   Imaging and/or referral(s): None  Interventional therapies: Planned, scheduled, and/or pending:   Left Shoulder injection no sedation    Considering:   Possible bilateral lumbar facet radiofrequency. Palliative bilateral lumbar facet block  Diagnostic right intra-articular shoulder joint injection  Diagnostic right suprascapular nerve block  Possible right suprascapular radiofrequency ablation.    Palliative PRN treatment(s):   Palliative bilateral lumbar facet block  Diagnostic right intra-articular shoulder joint injection Diagnostic right  suprascapular nerve block  Possible right suprascapular radiofrequency ablation.    Provider-requested follow-up: Return in about 2 months (around 05/02/2017) for MedMgmt, w/ Dr. Dossie Arbour, Procedure(NS), (ASAP).  Future Appointments Date Time Provider [place]  03/07/2017 12:30 PM Milinda Pointer, MD [facility] None  03/08/2017 10:00 AM [place] ADVISOR [place] None  03/17/2017 11:00 AM Mikey College, NP [facility] None  04/27/2017 10:20 AM Alisa Graff, FNP [facility] None  04/28/2017 1:00 PM Milinda Pointer, MD [facility] None   Primary Care Physician: Mikey College, NP Location: [facility] Note by: Vevelyn Francois NP Date: 03/01/2017; Time: 3:46 PM  Pain Score Disclaimer: We use the NRS-11 scale. This is a self-reported, subjective measurement of pain severity with only modest accuracy. It is used primarily to identify changes within a particular patient. It must be  understood that outpatient pain scales are significantly less accurate that those used for research, where they can be applied under ideal controlled circumstances with minimal exposure to variables. In reality, the score is likely to be a combination of pain intensity and pain affect, where pain affect describes the degree of emotional arousal or changes in action readiness caused by the sensory experience of pain. Factors such as social and work situation, setting, emotional state, anxiety levels, expectation, and prior pain experience may influence pain perception and show large inter-individual differences that may also be affected by time variables.  Patient instructions provided during this appointment: Patient Instructions    ____________________________________________________________________________________________  Medication Rules  Applies to: All patients receiving prescriptions (written or electronic).  Pharmacy of record: Pharmacy where  electronic prescriptions will be sent. If written prescriptions are taken to a different pharmacy, please inform the nursing staff. The pharmacy listed in the electronic medical record should be the one where you would like electronic prescriptions to be sent.  Prescription refills: Only during scheduled appointments. Applies to both, written and electronic prescriptions.  NOTE: The following applies primarily to controlled substances (Opioid* Pain Medications).   Patient's responsibilities: 1. Pain Pills: Bring all pain pills to every appointment (except for procedure appointments). 2. Pill Bottles: Bring pills in original pharmacy bottle. Always bring newest bottle. Bring bottle, even if empty. 3. Medication refills: You are responsible for knowing and keeping track of what medications you need refilled. The day before your appointment, write a list of all prescriptions that need to be refilled. Bring that list to your appointment and give it to the admitting nurse. Prescriptions will be written only during appointments. If you forget a medication, it will not be "Called in", "Faxed", or "electronically sent". You will need to get another appointment to get these prescribed. 4. Prescription Accuracy: You are responsible for carefully inspecting your prescriptions before leaving our office. Have the discharge nurse carefully go over each prescription with you, before taking them home. Make sure that your name is accurately spelled, that your address is correct. Check the name and dose of your medication to make sure it is accurate. Check the number of pills, and the written instructions to make sure they are clear and accurate. Make sure that you are given enough medication to last until your next medication refill appointment. 5. Taking Medication: Take medication as prescribed. Never take more pills than instructed. Never take medication more frequently than prescribed. Taking less pills or less  frequently is permitted and encouraged, when it comes to controlled substances (written prescriptions).  6. Inform other Doctors: Always inform, all of your healthcare providers, of all the medications you take. 7. Pain Medication from other Providers: You are not allowed to accept any additional pain medication from any other Doctor or Healthcare provider. There are two exceptions to this rule. (see below) In the event that you require additional pain medication, you are responsible for notifying us, as stated below. 8. Medication Agreement: You are responsible for carefully reading and following our Medication Agreement. This must be signed before receiving any prescriptions from our practice. Safely store a copy of your signed Agreement. Violations to the Agreement will result in no further prescriptions. (Additional copies of our Medication Agreement are available upon request.) 9. Laws, Rules, & Regulations: All patients are expected to follow all Federal and [employer], [employer], Rules, [employer] [facility]. Ignorance of the Laws does not constitute a valid excuse. The use of any illegal substances is  prohibited. 10. Adopted CDC guidelines & recommendations: Target dosing levels will be at or below 60 MME/day. Use of benzodiazepines** is not recommended.  Exceptions: There are only two exceptions to the rule of not receiving pain medications from other Healthcare Providers. 1. Exception #1 (Emergencies): In the event of an emergency (i.e.: accident requiring emergency care), you are allowed to receive additional pain medication. However, you are responsible for: As soon as you are able, call our office [phone], at any time of the day or night, and leave a message stating your name, the date and nature of the emergency, and the name and dose of the medication prescribed. In the event that your call is answered by a member of our staff, make sure to document and save the date, time, and the name of the  person that took your information.  2. Exception #2 (Planned Surgery): In the event that you are scheduled by another doctor or dentist to have any type of surgery or procedure, you are allowed (for a period no longer than 30 days), to receive additional pain medication, for the acute post-op pain. However, in this case, you are responsible for picking up a copy of our "Post-op Pain Management for Surgeons" handout, and giving it to your surgeon or dentist. This document is available at our office, and does not require an appointment to obtain it. Simply go to our office during business hours (Monday-Thursday from 8:00 AM to 4:00 PM) (Friday 8:00 AM to 12:00 Noon) or if you have a scheduled appointment with [name], prior to your surgery, and ask for it by name. In addition, you will need to provide [name] with your name, name of your surgeon, type of surgery, and date of procedure or surgery.  *Opioid medications include: morphine, codeine, oxycodone, oxymorphone, hydrocodone, hydromorphone, meperidine, tramadol, tapentadol, buprenorphine, fentanyl, methadone. **Benzodiazepine medications include: diazepam (Valium), alprazolam (Xanax), clonazepam (Klonopine), lorazepam (Ativan), clorazepate (Tranxene), chlordiazepoxide (Librium), estazolam (Prosom), oxazepam (Serax), temazepam (Restoril), triazolam (Halcion)  ____________________________________________________________________________________________  Trigger Point Injection Trigger points are areas where you have pain. A trigger point injection is a shot given in the trigger point to help relieve pain for a few days to a few months. Common places for trigger points include:  The neck.  The shoulders.  The upper back.  The lower back.  A trigger point injection will not cure long-lasting (chronic) pain permanently. These injections do not always work for every person, but for some people they can help to relieve pain for a few days to a few months. Tell  a health care provider about:  Any allergies you have.  All medicines you are taking, including vitamins, herbs, eye drops, creams, and over-the-counter medicines.  Any problems you or family members have had with anesthetic medicines.  Any blood disorders you have.  Any surgeries you have had.  Any medical conditions you have. What are the risks? Generally, this is a safe procedure. However, problems may occur, including:  Infection.  Bleeding.  Allergic reaction to the injected medicine.  Irritation of the skin around the injection site.  What happens before the procedure?  Ask your health care provider about changing or stopping your regular medicines. This is especially important if you are taking diabetes medicines or blood thinners. What happens during the procedure?  Your health care provider will feel for trigger points. A marker may be used to circle the area for the injection.  The skin over the trigger point will be washed with a  germ-killing (antiseptic) solution.  A thin needle is used for the shot. You may feel pain or a twitching feeling when the needle enters the trigger point.  A numbing solution may be injected into the trigger point. Sometimes a medicine to keep down swelling, redness, and warmth (inflammation) is also injected.  Your health care provider may move the needle around the area where the trigger point is located until the tightness and twitching goes away.  After the injection, your health care provider may put gentle pressure over the injection site.  The injection site will be covered with a bandage (dressing). The procedure may vary among health care providers and hospitals. What happens after the procedure?  The dressing can be taken off in a few hours or as told by your health care provider.  You may feel sore and stiff for 1-2 days. This information is not intended to replace advice given to you by your health care provider. Make  sure you discuss any questions you have with your health care provider. Document Released: 07/29/2011 Document Revised: 04/11/2016 Document Reviewed: 01/27/2015 Elsevier Interactive Patient Education  2018 [employer]  What are the risk, side effects and possible complications? Generally speaking, most procedures are safe.  However, with any procedure there are risks, side effects, and the possibility of complications.  The risks and complications are dependent upon the sites that are lesioned, or the type of nerve block to be performed.  The closer the procedure is to the spine, the more serious the risks are.  Great care is taken when placing the radio frequency needles, block needles or lesioning probes, but sometimes complications can occur. 1. Infection: Any time there is an injection through the skin, there is a risk of infection.  This is why sterile conditions are used for these blocks.  There are four possible types of infection. 1. Localized skin infection. 2. Central Nervous System Infection-This can be in the form of Meningitis, which can be deadly. 3. Epidural Infections-This can be in the form of an epidural abscess, which can cause pressure inside of the spine, causing compression of the spinal cord with subsequent paralysis. This would require an emergency surgery to decompress, and there are no guarantees that the patient would recover from the paralysis. 4. Discitis-This is an infection of the intervertebral discs.  It occurs in about 1% of discography procedures.  It is difficult to treat and it may lead to surgery.        2. Pain: the needles have to go through skin and soft tissues, will cause soreness.       3. Damage to internal structures:  The nerves to be lesioned may be near blood vessels or    other nerves which can be potentially damaged.       4. Bleeding: Bleeding is more common if the patient is taking blood thinners such as  aspirin,  Coumadin, Ticiid, Plavix, etc., or if he/she have some genetic predisposition  such as hemophilia. Bleeding into the spinal canal can cause compression of the spinal  cord with subsequent paralysis.  This would require an emergency surgery to  decompress and there are no guarantees that the patient would recover from the  paralysis.       5. Pneumothorax:  Puncturing of a lung is a possibility, every time a needle is introduced in  the area of the chest or upper back.  Pneumothorax refers to free air around the  collapsed  lung(s), inside of the thoracic cavity (chest cavity).  Another two possible  complications related to a similar event would include: Hemothorax and Chylothorax.   These are variations of the Pneumothorax, where instead of air around the collapsed  lung(s), you may have blood or chyle, respectively.       6. Spinal headaches: They may occur with any procedures in the area of the spine.       7. Persistent CSF (Cerebro-Spinal Fluid) leakage: This is a rare problem, but may occur  with prolonged intrathecal or epidural catheters either due to the formation of a fistulous  track or a dural tear.       8. Nerve damage: By working so close to the spinal cord, there is always a possibility of  nerve damage, which could be as serious as a permanent spinal cord injury with  paralysis.       9. Death:  Although rare, severe deadly allergic reactions known as "Anaphylactic  reaction" can occur to any of the medications used.      10. Worsening of the symptoms:  We can always make thing worse.  What are the chances of something like this happening? Chances of any of this occuring are extremely low.  By statistics, you have more of a chance of getting killed in a motor vehicle accident: while driving to the hospital than any of the above occurring .  Nevertheless, you should be aware that they are possibilities.  In general, it is similar to taking a shower.  Everybody knows that you can slip, hit  your head and get killed.  Does that mean that you should not shower again?  Nevertheless always keep in mind that statistics do not mean anything if you happen to be on the wrong side of them.  Even if a procedure has a 1 (one) in a 1,000,000 (million) chance of going wrong, it you happen to be that one..Also, keep in mind that by statistics, you have more of a chance of having something go wrong when taking medications.  Who should not have this procedure? If you are on a blood thinning medication (e.g. Coumadin, Plavix, see list of "Blood Thinners"), or if you have an active infection going on, you should not have the procedure.  If you are taking any blood thinners, please inform your physician.  How should I prepare for this procedure?  Do not eat or drink anything at least six hours prior to the procedure.  Bring a driver with you .  It cannot be a taxi.  Come accompanied by an adult that can drive you back, and that is strong enough to help you if your legs get weak or numb from the local anesthetic.  Take all of your medicines the morning of the procedure with just enough water to swallow them.  If you have diabetes, make sure that you are scheduled to have your procedure done first thing in the morning, whenever possible.  If you have diabetes, take only half of your insulin dose and notify our nurse that you have done so as soon as you arrive at the clinic.  If you are diabetic, but only take blood sugar pills (oral hypoglycemic), then do not take them on the morning of your procedure.  You may take them after you have had the procedure.  Do not take aspirin or any aspirin-containing medications, at least eleven (11) days prior to the procedure.  They may prolong bleeding.  Wear  loose fitting clothing that may be easy to take off and that you would not mind if it got stained with Betadine or blood.  Do not wear any jewelry or perfume  Remove any nail coloring.  It will interfere  with some of our monitoring equipment.  NOTE: Remember that this is not meant to be interpreted as a complete list of all possible complications.  Unforeseen problems may occur.  BLOOD THINNERS The following drugs contain aspirin or other products, which can cause increased bleeding during surgery and should not be taken for 2 weeks prior to and 1 week after surgery.  If you should need take something for relief of minor pain, you may take acetaminophen which is found in Tylenol,m Datril, Anacin-3 and Panadol. It is not blood thinner. The products listed below are.  Do not take any of the products listed below in addition to any listed on your instruction sheet.  A.P.C or A.P.C with Codeine Codeine Phosphate Capsules #3 Ibuprofen Ridaura  ABC compound Congesprin Imuran rimadil  Advil Cope Indocin Robaxisal  Alka-Seltzer Effervescent Pain Reliever and Antacid Coricidin or Coricidin-D  Indomethacin Rufen  Alka-Seltzer plus Cold Medicine Cosprin Ketoprofen S-A-C Tablets  Anacin Analgesic Tablets or Capsules Coumadin Korlgesic Salflex  Anacin Extra Strength Analgesic tablets or capsules CP-2 Tablets Lanoril Salicylate  Anaprox Cuprimine Capsules Levenox Salocol  Anexsia-D Dalteparin Magan Salsalate  Anodynos Darvon compound Magnesium Salicylate Sine-off  Ansaid Dasin Capsules Magsal Sodium Salicylate  Anturane Depen Capsules Marnal Soma  APF Arthritis pain formula Dewitt's Pills Measurin Stanback  Argesic Dia-Gesic Meclofenamic Sulfinpyrazone  Arthritis Bayer Timed Release Aspirin Diclofenac Meclomen Sulindac  Arthritis pain formula Anacin Dicumarol Medipren Supac  Analgesic (Safety coated) Arthralgen Diffunasal Mefanamic Suprofen  Arthritis Strength Bufferin Dihydrocodeine Mepro Compound Suprol  Arthropan liquid Dopirydamole Methcarbomol with Aspirin Synalgos  ASA tablets/Enseals Disalcid Micrainin Tagament  Ascriptin Doan's Midol Talwin  Ascriptin A/D Dolene Mobidin Tanderil  Ascriptin  Extra Strength Dolobid Moblgesic Ticlid  Ascriptin with Codeine Doloprin or Doloprin with Codeine Momentum Tolectin  Asperbuf Duoprin Mono-gesic Trendar  Aspergum Duradyne Motrin or Motrin IB Triminicin  Aspirin plain, buffered or enteric coated Durasal Myochrisine Trigesic  Aspirin Suppositories Easprin Nalfon Trillsate  Aspirin with Codeine Ecotrin Regular or Extra Strength Naprosyn Uracel  Atromid-S Efficin Naproxen Ursinus  Auranofin Capsules Elmiron Neocylate Vanquish  Axotal Emagrin Norgesic Verin  Azathioprine Empirin or Empirin with Codeine Normiflo Vitamin E  Azolid Emprazil Nuprin Voltaren  Bayer Aspirin plain, buffered or children's or timed BC Tablets or powders Encaprin Orgaran Warfarin Sodium  Buff-a-Comp Enoxaparin Orudis Zorpin  Buff-a-Comp with Codeine Equegesic Os-Cal-Gesic   Buffaprin Excedrin plain, buffered or Extra Strength Oxalid   Bufferin Arthritis Strength Feldene Oxphenbutazone   Bufferin plain or Extra Strength Feldene Capsules Oxycodone with Aspirin   Bufferin with Codeine Fenoprofen Fenoprofen Pabalate or Pabalate-SF   Buffets II Flogesic Panagesic   Buffinol plain or Extra Strength Florinal or Florinal with Codeine Panwarfarin   Buf-Tabs Flurbiprofen Penicillamine   Butalbital Compound Four-way cold tablets Penicillin   Butazolidin Fragmin Pepto-Bismol   Carbenicillin Geminisyn Percodan   Carna Arthritis Reliever Geopen Persantine   Carprofen Gold's salt Persistin   Chloramphenicol Goody's Phenylbutazone   Chloromycetin Haltrain Piroxlcam   Clmetidine heparin Plaquenil   Cllnoril Hyco-pap Ponstel   Clofibrate Hydroxy chloroquine Propoxyphen         Before stopping any of these medications, be sure to consult the physician who ordered them.  Some, such as Coumadin (Warfarin) are ordered to prevent or  treat serious conditions such as "deep thrombosis", "pumonary embolisms", and other heart problems.  The amount of time that you may need off of the  medication may also vary with the medication and the reason for which you were taking it.  If you are taking any of these medications, please make sure you notify your pain physician before you undergo any procedures.

## 2017-03-01 ENCOUNTER — Encounter: Payer: Self-pay | Admitting: Nurse Practitioner

## 2017-03-01 ENCOUNTER — Ambulatory Visit
Admission: RE | Admit: 2017-03-01 | Discharge: 2017-03-01 | Disposition: A | Discharge status: Home / Self Care | Payer: Medicare Other | Source: Ambulatory Visit | Attending: Nurse Practitioner | Admitting: Nurse Practitioner

## 2017-03-01 ENCOUNTER — Ambulatory Visit: Payer: Medicare Other | Attending: Nurse Practitioner | Admitting: Nurse Practitioner

## 2017-03-01 VITALS — BP 118/73 | HR 64 | Temp 97.9°F | Resp 18 | Ht 68.0 in | Wt 159.0 lb

## 2017-03-01 DIAGNOSIS — I5032 Chronic diastolic (congestive) heart failure: Secondary | ICD-10-CM | POA: Insufficient documentation

## 2017-03-01 DIAGNOSIS — M25512 Pain in left shoulder: Secondary | ICD-10-CM | POA: Diagnosis present

## 2017-03-01 DIAGNOSIS — M19011 Primary osteoarthritis, right shoulder: Secondary | ICD-10-CM | POA: Diagnosis not present

## 2017-03-01 DIAGNOSIS — Z950 Presence of cardiac pacemaker: Secondary | ICD-10-CM | POA: Diagnosis not present

## 2017-03-01 DIAGNOSIS — K219 Gastro-esophageal reflux disease without esophagitis: Secondary | ICD-10-CM | POA: Diagnosis not present

## 2017-03-01 DIAGNOSIS — M542 Cervicalgia: Principal | ICD-10-CM

## 2017-03-01 DIAGNOSIS — I341 Nonrheumatic mitral (valve) prolapse: Secondary | ICD-10-CM | POA: Insufficient documentation

## 2017-03-01 DIAGNOSIS — M4312 Spondylolisthesis, cervical region: Secondary | ICD-10-CM | POA: Diagnosis not present

## 2017-03-01 DIAGNOSIS — M5137 Other intervertebral disc degeneration, lumbosacral region: Secondary | ICD-10-CM | POA: Insufficient documentation

## 2017-03-01 DIAGNOSIS — G2581 Restless legs syndrome: Secondary | ICD-10-CM | POA: Diagnosis not present

## 2017-03-01 DIAGNOSIS — G893 Neoplasm related pain (acute) (chronic): Secondary | ICD-10-CM | POA: Diagnosis not present

## 2017-03-01 DIAGNOSIS — N183 Chronic kidney disease, stage 3 (moderate): Secondary | ICD-10-CM | POA: Diagnosis not present

## 2017-03-01 DIAGNOSIS — J449 Chronic obstructive pulmonary disease, unspecified: Secondary | ICD-10-CM | POA: Diagnosis not present

## 2017-03-01 DIAGNOSIS — I130 Hypertensive heart and chronic kidney disease with heart failure and stage 1 through stage 4 chronic kidney disease, or unspecified chronic kidney disease: Secondary | ICD-10-CM | POA: Diagnosis not present

## 2017-03-01 DIAGNOSIS — M47812 Spondylosis without myelopathy or radiculopathy, cervical region: Secondary | ICD-10-CM | POA: Insufficient documentation

## 2017-03-01 DIAGNOSIS — I495 Sick sinus syndrome: Secondary | ICD-10-CM | POA: Insufficient documentation

## 2017-03-01 DIAGNOSIS — M47816 Spondylosis without myelopathy or radiculopathy, lumbar region: Secondary | ICD-10-CM | POA: Diagnosis not present

## 2017-03-01 DIAGNOSIS — G894 Chronic pain syndrome: Secondary | ICD-10-CM | POA: Diagnosis not present

## 2017-03-01 DIAGNOSIS — N3281 Overactive bladder: Secondary | ICD-10-CM | POA: Insufficient documentation

## 2017-03-01 DIAGNOSIS — Z8614 Personal history of Methicillin resistant Staphylococcus aureus infection: Secondary | ICD-10-CM | POA: Insufficient documentation

## 2017-03-01 DIAGNOSIS — I4892 Unspecified atrial flutter: Secondary | ICD-10-CM | POA: Diagnosis not present

## 2017-03-01 DIAGNOSIS — M545 Low back pain: Secondary | ICD-10-CM | POA: Insufficient documentation

## 2017-03-01 DIAGNOSIS — I6523 Occlusion and stenosis of bilateral carotid arteries: Secondary | ICD-10-CM | POA: Insufficient documentation

## 2017-03-01 DIAGNOSIS — M4182 Other forms of scoliosis, cervical region: Secondary | ICD-10-CM | POA: Insufficient documentation

## 2017-03-01 DIAGNOSIS — E785 Hyperlipidemia, unspecified: Secondary | ICD-10-CM | POA: Diagnosis not present

## 2017-03-01 DIAGNOSIS — M48061 Spinal stenosis, lumbar region without neurogenic claudication: Secondary | ICD-10-CM | POA: Diagnosis not present

## 2017-03-01 DIAGNOSIS — E559 Vitamin D deficiency, unspecified: Secondary | ICD-10-CM | POA: Insufficient documentation

## 2017-03-01 DIAGNOSIS — Z79891 Long term (current) use of opiate analgesic: Secondary | ICD-10-CM | POA: Insufficient documentation

## 2017-03-01 DIAGNOSIS — G8929 Other chronic pain: Secondary | ICD-10-CM

## 2017-03-01 DIAGNOSIS — M25511 Pain in right shoulder: Secondary | ICD-10-CM | POA: Insufficient documentation

## 2017-03-01 DIAGNOSIS — M109 Gout, unspecified: Secondary | ICD-10-CM | POA: Diagnosis not present

## 2017-03-01 DIAGNOSIS — Z853 Personal history of malignant neoplasm of breast: Secondary | ICD-10-CM | POA: Insufficient documentation

## 2017-03-01 DIAGNOSIS — M810 Age-related osteoporosis without current pathological fracture: Secondary | ICD-10-CM | POA: Insufficient documentation

## 2017-03-01 DIAGNOSIS — I4891 Unspecified atrial fibrillation: Secondary | ICD-10-CM | POA: Insufficient documentation

## 2017-03-01 DIAGNOSIS — N390 Urinary tract infection, site not specified: Secondary | ICD-10-CM | POA: Insufficient documentation

## 2017-03-01 MED ORDER — OXYCODONE HCL 20 MG PO TABS: 20.0000 mg | ORAL_TABLET | Freq: Three times a day (TID) | ORAL | 0 refills | Status: DC | PRN

## 2017-03-01 MED ORDER — ROPINIROLE HCL 2 MG PO TABS: 2.0000 mg | ORAL_TABLET | Freq: Every day | ORAL | 0 refills | Status: DC

## 2017-03-01 NOTE — Patient Instructions (Addendum)
____________________________________________________________________________________________  Medication Rules  Applies to: All patients receiving prescriptions (written or electronic).  Pharmacy of record: Pharmacy where electronic prescriptions will be sent. If written prescriptions are taken to a different pharmacy, please inform the nursing staff. The pharmacy listed in the electronic medical record should be the one where you would like electronic prescriptions to be sent.  Prescription refills: Only during scheduled appointments. Applies to both, written and electronic prescriptions.  NOTE: The following applies primarily to controlled substances (Opioid* Pain Medications).   Patient's responsibilities: 1. Pain Pills: Bring all pain pills to every appointment (except for procedure appointments). 2. Pill Bottles: Bring pills in original pharmacy bottle. Always bring newest bottle. Bring bottle, even if empty. 3. Medication refills: You are responsible for knowing and keeping track of what medications you need refilled. The day before your appointment, write a list of all prescriptions that need to be refilled. Bring that list to your appointment and give it to the admitting nurse. Prescriptions will be written only during appointments. If you forget a medication, it will not be "Called in", "Faxed", or "electronically sent". You will need to get another appointment to get these prescribed. 4. Prescription Accuracy: You are responsible for carefully inspecting your prescriptions before leaving our office. Have the discharge nurse carefully go over each prescription with you, before taking them home. Make sure that your name is accurately spelled, that your address is correct. Check the name and dose of your medication to make sure it is accurate. Check the number of pills, and the written instructions to make sure they are clear and accurate. Make sure that you are given enough medication to  last until your next medication refill appointment. 5. Taking Medication: Take medication as prescribed. Never take more pills than instructed. Never take medication more frequently than prescribed. Taking less pills or less frequently is permitted and encouraged, when it comes to controlled substances (written prescriptions).  6. Inform other Doctors: Always inform, all of your healthcare providers, of all the medications you take. 7. Pain Medication from other Providers: You are not allowed to accept any additional pain medication from any other Doctor or Healthcare provider. There are two exceptions to this rule. (see below) In the event that you require additional pain medication, you are responsible for notifying us, as stated below. 8. Medication Agreement: You are responsible for carefully reading and following our Medication Agreement. This must be signed before receiving any prescriptions from our practice. Safely store a copy of your signed Agreement. Violations to the Agreement will result in no further prescriptions. (Additional copies of our Medication Agreement are available upon request.) 9. Laws, Rules, & Regulations: All patients are expected to follow all Federal and State Laws, Statutes, Rules, & Regulations. Ignorance of the Laws does not constitute a valid excuse. The use of any illegal substances is prohibited. 10. Adopted CDC guidelines & recommendations: Target dosing levels will be at or below 60 MME/day. Use of benzodiazepines** is not recommended.  Exceptions: There are only two exceptions to the rule of not receiving pain medications from other Healthcare Providers. 1. Exception #1 (Emergencies): In the event of an emergency (i.e.: accident requiring emergency care), you are allowed to receive additional pain medication. However, you are responsible for: As soon as you are able, call our office [phone], at any time of the day or night, and leave a message stating your  name, the date and nature of the emergency, and the name and dose of the medication   prescribed. In the event that your call is answered by a member of our staff, make sure to document and save the date, time, and the name of the person that took your information.  2. Exception #2 (Planned Surgery): In the event that you are scheduled by another doctor or dentist to have any type of surgery or procedure, you are allowed (for a period no longer than 30 days), to receive additional pain medication, for the acute post-op pain. However, in this case, you are responsible for picking up a copy of our "Post-op Pain Management for Surgeons" handout, and giving it to your surgeon or dentist. This document is available at our office, and does not require an appointment to obtain it. Simply go to our office during business hours (Monday-Thursday from 8:00 AM to 4:00 PM) (Friday 8:00 AM to 12:00 Noon) or if you have a scheduled appointment with [name], prior to your surgery, and ask for it by name. In addition, you will need to provide [name] with your name, name of your surgeon, type of surgery, and date of procedure or surgery.  *Opioid medications include: morphine, codeine, oxycodone, oxymorphone, hydrocodone, hydromorphone, meperidine, tramadol, tapentadol, buprenorphine, fentanyl, methadone. **Benzodiazepine medications include: diazepam (Valium), alprazolam (Xanax), clonazepam (Klonopine), lorazepam (Ativan), clorazepate (Tranxene), chlordiazepoxide (Librium), estazolam (Prosom), oxazepam (Serax), temazepam (Restoril), triazolam (Halcion)  ____________________________________________________________________________________________  Trigger Point Injection Trigger points are areas where you have pain. A trigger point injection is a shot given in the trigger point to help relieve pain for a few days to a few months. Common places for trigger points include:  The neck.  The shoulders.  The upper back.  The lower  back.  A trigger point injection will not cure long-lasting (chronic) pain permanently. These injections do not always work for every person, but for some people they can help to relieve pain for a few days to a few months. Tell a health care provider about:  Any allergies you have.  All medicines you are taking, including vitamins, herbs, eye drops, creams, and over-the-counter medicines.  Any problems you or family members have had with anesthetic medicines.  Any blood disorders you have.  Any surgeries you have had.  Any medical conditions you have. What are the risks? Generally, this is a safe procedure. However, problems may occur, including:  Infection.  Bleeding.  Allergic reaction to the injected medicine.  Irritation of the skin around the injection site.  What happens before the procedure?  Ask your health care provider about changing or stopping your regular medicines. This is especially important if you are taking diabetes medicines or blood thinners. What happens during the procedure?  Your health care provider will feel for trigger points. A marker may be used to circle the area for the injection.  The skin over the trigger point will be washed with a germ-killing (antiseptic) solution.  A thin needle is used for the shot. You may feel pain or a twitching feeling when the needle enters the trigger point.  A numbing solution may be injected into the trigger point. Sometimes a medicine to keep down swelling, redness, and warmth (inflammation) is also injected.  Your health care provider may move the needle around the area where the trigger point is located until the tightness and twitching goes away.  After the injection, your health care provider may put gentle pressure over the injection site.  The injection site will be covered with a bandage (dressing). The procedure may vary among health care providers  and hospitals. What happens after the  procedure?  The dressing can be taken off in a few hours or as told by your health care provider.  You may feel sore and stiff for 1-2 days. This information is not intended to replace advice given to you by your health care provider. Make sure you discuss any questions you have with your health care provider. Document Released: 07/29/2011 Document Revised: 04/11/2016 Document Reviewed: 01/27/2015 Elsevier Interactive Patient Education  2018 [place]  What are the risk, side effects and possible complications? Generally speaking, most procedures are safe.  However, with any procedure there are risks, side effects, and the possibility of complications.  The risks and complications are dependent upon the sites that are lesioned, or the type of nerve block to be performed.  The closer the procedure is to the spine, the more serious the risks are.  Great care is taken when placing the radio frequency needles, block needles or lesioning probes, but sometimes complications can occur. 1. Infection: Any time there is an injection through the skin, there is a risk of infection.  This is why sterile conditions are used for these blocks.  There are four possible types of infection. 1. Localized skin infection. 2. Central Nervous System Infection-This can be in the form of Meningitis, which can be deadly. 3. Epidural Infections-This can be in the form of an epidural abscess, which can cause pressure inside of the spine, causing compression of the spinal cord with subsequent paralysis. This would require an emergency surgery to decompress, and there are no guarantees that the patient would recover from the paralysis. 4. Discitis-This is an infection of the intervertebral discs.  It occurs in about 1% of discography procedures.  It is difficult to treat and it may lead to surgery.        2. Pain: the needles have to go through skin and soft tissues, will cause soreness.        3. Damage to internal structures:  The nerves to be lesioned may be near blood vessels or    other nerves which can be potentially damaged.       4. Bleeding: Bleeding is more common if the patient is taking blood thinners such as  aspirin, Coumadin, Ticiid, Plavix, etc., or if he/she have some genetic predisposition  such as hemophilia. Bleeding into the spinal canal can cause compression of the spinal  cord with subsequent paralysis.  This would require an emergency surgery to  decompress and there are no guarantees that the patient would recover from the  paralysis.       5. Pneumothorax:  Puncturing of a lung is a possibility, every time a needle is introduced in  the area of the chest or upper back.  Pneumothorax refers to free air around the  collapsed lung(s), inside of the thoracic cavity (chest cavity).  Another two possible  complications related to a similar event would include: Hemothorax and Chylothorax.   These are variations of the Pneumothorax, where instead of air around the collapsed  lung(s), you may have blood or chyle, respectively.       6. Spinal headaches: They may occur with any procedures in the area of the spine.       7. Persistent CSF (Cerebro-Spinal Fluid) leakage: This is a rare problem, but may occur  with prolonged intrathecal or epidural catheters either due to the formation of a fistulous  track or a dural tear.  8. Nerve damage: By working so close to the spinal cord, there is always a possibility of  nerve damage, which could be as serious as a permanent spinal cord injury with  paralysis.       9. Death:  Although rare, severe deadly allergic reactions known as "Anaphylactic  reaction" can occur to any of the medications used.      10. Worsening of the symptoms:  We can always make thing worse.  What are the chances of something like this happening? Chances of any of this occuring are extremely low.  By statistics, you have more of a chance of getting killed in  a motor vehicle accident: while driving to the hospital than any of the above occurring .  Nevertheless, you should be aware that they are possibilities.  In general, it is similar to taking a shower.  Everybody knows that you can slip, hit your head and get killed.  Does that mean that you should not shower again?  Nevertheless always keep in mind that statistics do not mean anything if you happen to be on the wrong side of them.  Even if a procedure has a 1 (one) in a 1,000,000 (million) chance of going wrong, it you happen to be that one..Also, keep in mind that by statistics, you have more of a chance of having something go wrong when taking medications.  Who should not have this procedure? If you are on a blood thinning medication (e.g. Coumadin, Plavix, see list of "Blood Thinners"), or if you have an active infection going on, you should not have the procedure.  If you are taking any blood thinners, please inform your physician.  How should I prepare for this procedure?  Do not eat or drink anything at least six hours prior to the procedure.  Bring a driver with you .  It cannot be a taxi.  Come accompanied by an adult that can drive you back, and that is strong enough to help you if your legs get weak or numb from the local anesthetic.  Take all of your medicines the morning of the procedure with just enough water to swallow them.  If you have diabetes, make sure that you are scheduled to have your procedure done first thing in the morning, whenever possible.  If you have diabetes, take only half of your insulin dose and notify our nurse that you have done so as soon as you arrive at the clinic.  If you are diabetic, but only take blood sugar pills (oral hypoglycemic), then do not take them on the morning of your procedure.  You may take them after you have had the procedure.  Do not take aspirin or any aspirin-containing medications, at least eleven (11) days prior to the procedure.   They may prolong bleeding.  Wear loose fitting clothing that may be easy to take off and that you would not mind if it got stained with Betadine or blood.  Do not wear any jewelry or perfume  Remove any nail coloring.  It will interfere with some of our monitoring equipment.  NOTE: Remember that this is not meant to be interpreted as a complete list of all possible complications.  Unforeseen problems may occur.  BLOOD THINNERS The following drugs contain aspirin or other products, which can cause increased bleeding during surgery and should not be taken for 2 weeks prior to and 1 week after surgery.  If you should need take something for relief of minor  pain, you may take acetaminophen which is found in Tylenol,m Datril, Anacin-3 and Panadol. It is not blood thinner. The products listed below are.  Do not take any of the products listed below in addition to any listed on your instruction sheet.  A.P.C or A.P.C with Codeine Codeine Phosphate Capsules #3 Ibuprofen Ridaura  ABC compound Congesprin Imuran rimadil  Advil Cope Indocin Robaxisal  Alka-Seltzer Effervescent Pain Reliever and Antacid Coricidin or Coricidin-D  Indomethacin Rufen  Alka-Seltzer plus Cold Medicine Cosprin Ketoprofen S-A-C Tablets  Anacin Analgesic Tablets or Capsules Coumadin Korlgesic Salflex  Anacin Extra Strength Analgesic tablets or capsules CP-2 Tablets Lanoril Salicylate  Anaprox Cuprimine Capsules Levenox Salocol  Anexsia-D Dalteparin Magan Salsalate  Anodynos Darvon compound Magnesium Salicylate Sine-off  Ansaid Dasin Capsules Magsal Sodium Salicylate  Anturane Depen Capsules Marnal Soma  APF Arthritis pain formula Dewitt's Pills Measurin Stanback  Argesic Dia-Gesic Meclofenamic Sulfinpyrazone  Arthritis Bayer Timed Release Aspirin Diclofenac Meclomen Sulindac  Arthritis pain formula Anacin Dicumarol Medipren Supac  Analgesic (Safety coated) Arthralgen Diffunasal Mefanamic Suprofen  Arthritis Strength  Bufferin Dihydrocodeine Mepro Compound Suprol  Arthropan liquid Dopirydamole Methcarbomol with Aspirin Synalgos  ASA tablets/Enseals Disalcid Micrainin Tagament  Ascriptin Doan's Midol Talwin  Ascriptin A/D Dolene Mobidin Tanderil  Ascriptin Extra Strength Dolobid Moblgesic Ticlid  Ascriptin with Codeine Doloprin or Doloprin with Codeine Momentum Tolectin  Asperbuf Duoprin Mono-gesic Trendar  Aspergum Duradyne Motrin or Motrin IB Triminicin  Aspirin plain, buffered or enteric coated Durasal Myochrisine Trigesic  Aspirin Suppositories Easprin Nalfon Trillsate  Aspirin with Codeine Ecotrin Regular or Extra Strength Naprosyn Uracel  Atromid-S Efficin Naproxen Ursinus  Auranofin Capsules Elmiron Neocylate Vanquish  Axotal Emagrin Norgesic Verin  Azathioprine Empirin or Empirin with Codeine Normiflo Vitamin E  Azolid Emprazil Nuprin Voltaren  Bayer Aspirin plain, buffered or children's or timed BC Tablets or powders Encaprin Orgaran Warfarin Sodium  Buff-a-Comp Enoxaparin Orudis Zorpin  Buff-a-Comp with Codeine Equegesic Os-Cal-Gesic   Buffaprin Excedrin plain, buffered or Extra Strength Oxalid   Bufferin Arthritis Strength Feldene Oxphenbutazone   Bufferin plain or Extra Strength Feldene Capsules Oxycodone with Aspirin   Bufferin with Codeine Fenoprofen Fenoprofen Pabalate or Pabalate-SF   Buffets II Flogesic Panagesic   Buffinol plain or Extra Strength Florinal or Florinal with Codeine Panwarfarin   Buf-Tabs Flurbiprofen Penicillamine   Butalbital Compound Four-way cold tablets Penicillin   Butazolidin Fragmin Pepto-Bismol   Carbenicillin Geminisyn Percodan   Carna Arthritis Reliever Geopen Persantine   Carprofen Gold's salt Persistin   Chloramphenicol Goody's Phenylbutazone   Chloromycetin Haltrain Piroxlcam   Clmetidine heparin Plaquenil   Cllnoril Hyco-pap Ponstel   Clofibrate Hydroxy chloroquine Propoxyphen         Before stopping any of these medications, be sure to consult  the physician who ordered them.  Some, such as Coumadin (Warfarin) are ordered to prevent or treat serious conditions such as "deep thrombosis", "pumonary embolisms", and other heart problems.  The amount of time that you may need off of the medication may also vary with the medication and the reason for which you were taking it.  If you are taking any of these medications, please make sure you notify your pain physician before you undergo any procedures.

## 2017-03-01 NOTE — Progress Notes (Signed)
toxNursing Pain Medication Assessment:  Safety precautions to be maintained throughout the outpatient stay will include: orient to surroundings, keep bed in low position, maintain call bell within reach at all times, provide assistance with transfer out of bed and ambulation.  Medication Inspection Compliance: Pill count conducted under aseptic conditions, in front of the patient. Neither the pills nor the bottle was removed from the patient's sight at any time. Once count was completed pills were immediately returned to the patient in their original bottle.  Medication: Oxycodone IR Pill/Patch Count: 12 of 90 pills remain Pill/Patch Appearance: Markings consistent with prescribed medication Bottle Appearance: Standard pharmacy container. Clearly labeled. Filled Date: 06 / 22 / 2018 Last Medication intake:  Today

## 2017-03-04 ENCOUNTER — Telehealth: Payer: Self-pay | Admitting: *Deleted

## 2017-03-04 LAB — TOXASSURE SELECT 13 (MW), URINE

## 2017-03-04 NOTE — Patient Outreach (Signed)
[facility]) Care Management  03/04/2017  Kristin [name] 14-Dec-1929 [id number]   Care coordination   [facility] CM spoke with Kristin [name] on 02/24/17 for follow up outreach call She reported that she is doing well except back pain.  [facility] CM assessed her back pain to inquire of home treatment.  She confirms she is being followed for this chronic back pain by Dr Richardean Chimera at [facility] pain clinic.  [facility] CM referred her to pain management provider for further changes in treatment plan.  When inquired Kristin [name] discussed the only HHPT she has received was for mobility after her last hospitalization.  [facility] CM discussed that she may be able to consult Dr Dossie Arbour for possible HHPT or outpatient PT to concentrate on her back.    [facility] CM called 03/04/17 and left a voice message inquiring about possible PT for Kristin [name] on her behalf  [facility] CM left [facility] CM's mobile number and Kristin [name] home number for any further information or a return call at [phone] after no answers x 2 at [phone]   [facility] CM also sent an EPIC in basket message to Dr Dossie Arbour about Kristin [name] interest in possible physical therapy sessions for her back pain. [facility] CM will follow up with Kristin [name] in 2 weeks for telephone follow up outreach  [place]. Lavina Hamman, RN, BSN, [place] [facility] [phone]

## 2017-03-07 ENCOUNTER — Ambulatory Visit (HOSPITAL_BASED_OUTPATIENT_CLINIC_OR_DEPARTMENT_OTHER): Payer: Medicare Other | Admitting: Pain Medicine

## 2017-03-07 ENCOUNTER — Telehealth: Payer: Self-pay | Admitting: *Deleted

## 2017-03-07 ENCOUNTER — Ambulatory Visit
Admission: RE | Admit: 2017-03-07 | Discharge: 2017-03-07 | Disposition: A | Discharge status: Home / Self Care | Payer: Medicare Other | Source: Ambulatory Visit | Attending: Pain Medicine | Admitting: Pain Medicine

## 2017-03-07 ENCOUNTER — Telehealth: Payer: Self-pay | Admitting: Pain Medicine

## 2017-03-07 ENCOUNTER — Encounter: Payer: Self-pay | Admitting: Pain Medicine

## 2017-03-07 VITALS — BP 119/72 | HR 61 | Temp 96.2°F | Resp 14 | Ht 68.0 in | Wt 158.0 lb

## 2017-03-07 DIAGNOSIS — Z79899 Other long term (current) drug therapy: Secondary | ICD-10-CM | POA: Diagnosis not present

## 2017-03-07 DIAGNOSIS — T8484XA Pain due to internal orthopedic prosthetic devices, implants and grafts, initial encounter: Secondary | ICD-10-CM

## 2017-03-07 DIAGNOSIS — M25511 Pain in right shoulder: Secondary | ICD-10-CM | POA: Insufficient documentation

## 2017-03-07 DIAGNOSIS — Z888 Allergy status to other drugs, medicaments and biological substances status: Secondary | ICD-10-CM | POA: Insufficient documentation

## 2017-03-07 DIAGNOSIS — Z96611 Presence of right artificial shoulder joint: Secondary | ICD-10-CM | POA: Diagnosis not present

## 2017-03-07 DIAGNOSIS — G8929 Other chronic pain: Secondary | ICD-10-CM | POA: Insufficient documentation

## 2017-03-07 DIAGNOSIS — M25512 Pain in left shoulder: Secondary | ICD-10-CM | POA: Diagnosis not present

## 2017-03-07 DIAGNOSIS — M545 Low back pain: Secondary | ICD-10-CM | POA: Diagnosis not present

## 2017-03-07 DIAGNOSIS — Z950 Presence of cardiac pacemaker: Secondary | ICD-10-CM | POA: Insufficient documentation

## 2017-03-07 DIAGNOSIS — Z881 Allergy status to other antibiotic agents status: Secondary | ICD-10-CM | POA: Insufficient documentation

## 2017-03-07 DIAGNOSIS — Z79891 Long term (current) use of opiate analgesic: Secondary | ICD-10-CM | POA: Insufficient documentation

## 2017-03-07 MED ORDER — FENTANYL CITRATE (PF) 100 MCG/2ML IJ SOLN
INTRAMUSCULAR | Status: AC
  Filled 2017-03-07: qty 2

## 2017-03-07 MED ORDER — ROPIVACAINE HCL 2 MG/ML IJ SOLN
9.0000 mL | Freq: Once | INTRAMUSCULAR | Status: AC
  Administered 2017-03-07: 9 mL

## 2017-03-07 MED ORDER — ROPIVACAINE HCL 2 MG/ML IJ SOLN
INTRAMUSCULAR | Status: AC
  Filled 2017-03-07: qty 10

## 2017-03-07 MED ORDER — LIDOCAINE HCL (PF) 1.5 % IJ SOLN
20.0000 mL | Freq: Once | INTRAMUSCULAR | Status: AC
  Administered 2017-03-07: 20 mL
  Filled 2017-03-07: qty 20

## 2017-03-07 MED ORDER — MIDAZOLAM HCL 5 MG/5ML IJ SOLN
1.0000 mg | INTRAMUSCULAR | Status: DC | PRN
  Administered 2017-03-07: 2 mg via INTRAVENOUS

## 2017-03-07 MED ORDER — MIDAZOLAM HCL 5 MG/5ML IJ SOLN
INTRAMUSCULAR | Status: AC
  Filled 2017-03-07: qty 5

## 2017-03-07 MED ORDER — METHYLPREDNISOLONE ACETATE 80 MG/ML IJ SUSP
INTRAMUSCULAR | Status: AC
  Filled 2017-03-07: qty 1

## 2017-03-07 MED ORDER — METHYLPREDNISOLONE ACETATE 80 MG/ML IJ SUSP
80.0000 mg | Freq: Once | INTRAMUSCULAR | Status: AC
  Administered 2017-03-07: 80 mg

## 2017-03-07 MED ORDER — FENTANYL CITRATE (PF) 100 MCG/2ML IJ SOLN
25.0000 ug | INTRAMUSCULAR | Status: DC | PRN
  Administered 2017-03-07: 50 ug via INTRAVENOUS

## 2017-03-07 NOTE — Patient Outreach (Signed)
[place] [facility]) Care Management  03/07/2017  Kristin [name] 11-07-1929 [id number]   Care coordination Kori from Pain management office [phone] called to clarify request for PT needs [name] interested in  [facility] CM had left a voice message on 03/04/17 regarding patients concerns with continued pain of back and inquiry about potential interventions to assist with pain reduction other than present pain management plan in place during 02/24/17 telephone follow up Pt with appointment today at pain management provider Discussed pt needs for PT for chronic back pain (stretches, exercises, precautions)   Plans to make contact with [name] in 1-2 weeks for follow up  [place]. Lavina Hamman, RN, BSN, [place] Care Management [phone]

## 2017-03-07 NOTE — Patient Instructions (Addendum)
____________________________________________________________________________________________  Post-Procedure instructions Instructions:  Apply ice: Fill a plastic sandwich bag with crushed ice. Cover it with a small towel and apply to injection site. Apply for 15 minutes then remove x 15 minutes. Repeat sequence on day of procedure, until you go to bed. The purpose is to minimize swelling and discomfort after procedure.  Apply heat: Apply heat to procedure site starting the day following the procedure. The purpose is to treat any soreness and discomfort from the procedure.  Food intake: Start with clear liquids (like water) and advance to regular food, as tolerated.   Physical activities: Keep activities to a minimum for the first 8 hours after the procedure.   Driving: If you have received any sedation, you are not allowed to drive for 24 hours after your procedure.  Blood thinner: Restart your blood thinner 6 hours after your procedure. (Only for those taking blood thinners)  Insulin: As soon as you can eat, you may resume your normal dosing schedule. (Only for those taking insulin)  Infection prevention: Keep procedure site clean and dry.  Post-procedure Pain Diary: Extremely important that this be done correctly and accurately. Recorded information will be used to determine the next step in treatment.  Pain evaluated is that of treated area only. Do not include pain from an untreated area.  Complete every hour, on the hour, for the initial 8 hours. Set an alarm to help you do this part accurately.  Do not go to sleep and have it completed later. It will not be accurate.  Follow-up appointment: Keep your follow-up appointment after the procedure. Usually 2 weeks for most procedures. (6 weeks in the case of radiofrequency.) Bring you pain diary.  Expect:  From numbing medicine (AKA: Local Anesthetics): Numbness or decrease in pain.  Onset: Full effect within 15 minutes of  injected.  Duration: It will depend on the type of local anesthetic used. On the average, 1 to 8 hours.   From steroids: Decrease in swelling or inflammation. Once inflammation is improved, relief of the pain will follow.  Onset of benefits: Depends on the amount of swelling present. The more swelling, the longer it will take for the benefits to be seen. In some cases, up to 10 days.  Duration: Steroids will stay in the system x 2 weeks. Duration of benefits will depend on multiple posibilities including persistent irritating factors.  From procedure: Some discomfort is to be expected once the numbing medicine wears off. This should be minimal if ice and heat are applied as instructed. Call if:  You experience numbness and weakness that gets worse with time, as opposed to wearing off.  New onset bowel or bladder incontinence. (Spinal procedures only)  Emergency Numbers:  [place] hours (Monday - Thursday, 8:00 AM - 4:00 PM) (Friday, 9:00 AM - 12:00 Noon): [phone]  After hours: [phone] ____________________________________________________________________________________________  Celiac Plexus Block Patient Information  Description: The celiac plexus is a group of nerves which are part of the sympathetic nervous system.  These nerves supply organs in the abdomen and pelvis.  Specific organs supplied with sensation by the celiac plexus include the stomach, liver, gallbladder, pancreas, kidneys and part of the gut.   The celiac plexus is located on both sides of the aorta at approximately the level of the first lumbar vertebral body.  The block will be performed with you lying on your abdomen with a pillow underneath.  Using direct x-ray guidance, the celiac plexus will be located on both  sides of the spine.  Numbing medicine will be used to deaden the skin prior to needle insertion.  In most cases, a small amount of sedation can be given by IV prior to the numbing  medicine.  Two small needles will be place near the celiac plexus and local anesthetic and steroid will be injected.  The entire block usually last about 15-25 minutes.  Conditions which may be treated by celiac plexus block:   Acute and chronic pancreatitis  Pain from liver or pancreatic cancer  Pain from Crohn's disease  Other types of abdominal or flank pain  Preparation for the injection:  1. Do not eat any solid food or dairy products within 8 hours of your appointment. 2. You may drink clear liquids up to 3 hours before appointment.  Clear liquids include water, black coffee, juice or soda.  No milk or cream please. 3. You may take your regular medication, including pain medications, with a sip of water before your appointment.  Diabetics should hold regular insulin (if taken separately) and take 1/2 normal NPH dose in the morning of the procedure.  Carry some sugar containing items with you to your appointment. 4. A driver must accompany you and be prepared to drive you home after your procedure. 5. Bring all your current medications with you. 6. An IV may be inserted and sedation may be given at the discretion of the physician. 7. A blood pressure cuff, EKG, and other monitors will often be applied during the procedure.  Some patients may need to have extra oxygen administered for a short period. 8. You will be asked to provide medical information, including your allergies and medications, prior to the procedure.  We must know immediately if you are taking blood thinners (like Coumadin/Warfarin) or if you are allergic to IV iodine contrast (dye).  We must know if you could possible be pregnant.  Possible side-effects:   Bleeding from needle site or deeper  Infection (rarre, can require surgery)  Nerve injury (rare)  Numbness & tingling (temporary)  Collapsed lung (rare)  Spinal headache ( a headache worse with upright posture)  Light-headedness (temporary)  Pain at  injection site (several days)  Decreased blood pressure (temporary)  Weakness in legs (temporary)  Seizure or other drug reaction (rare)  Call if you experience:   Fever/chills associated with headache or increased back/neck pain  Headache worsened by an upright position  New onset weakness or numbness of an extremity below the injection site.  Hives or difficulty breathing (go to the emergency room)  Inflammation or drainage at the injection site.  New onset diarrhea lasting more than 2 weeks.  New symptoms which are concerning to you  Please note:  If effective, we will often do a series of 2-3 injections spaced 3-6 weeks apart to maximally decrease your pain.  If initial series is effective, you may be a candidate for a more permanent block of the celiac plexus. .  If you have questions, please call [phone] [facility]    Pain Management Discharge Instructions  General Discharge Instructions :  If you need to reach your doctor call: Monday-Friday 8:00 am - 4:00 pm at [phone] or toll free [phone].  After clinic hours [phone] to have operator reach doctor.  Bring all of your medication bottles to all your appointments in the pain clinic.  To cancel or reschedule your appointment with Pain Management please remember to call 24 hours in advance to avoid  a fee.  Refer to the educational materials which you have been given on: General Risks, I had my Procedure. Discharge Instructions, Post Sedation.  Post Procedure Instructions:  The drugs you were given will stay in your system until tomorrow, so for the next 24 hours you should not drive, make any legal decisions or drink any alcoholic beverages.  You may eat anything you prefer, but it is better to start with liquids then soups and crackers, and gradually work up to solid foods.  Please notify your doctor immediately if you have any unusual bleeding, trouble  breathing or pain that is not related to your normal pain.  Depending on the type of procedure that was done, some parts of your body may feel week and/or numb.  This usually clears up by tonight or the next day.  Walk with the use of an assistive device or accompanied by an adult for the 24 hours.  You may use ice on the affected area for the first 24 hours.  Put ice in a Ziploc bag and cover with a towel and place against area 15 minutes on 15 minutes off.  You may switch to heat after 24 hours.GENERAL RISKS AND COMPLICATIONS  What are the risk, side effects and possible complications? Generally speaking, most procedures are safe.  However, with any procedure there are risks, side effects, and the possibility of complications.  The risks and complications are dependent upon the sites that are lesioned, or the type of nerve block to be performed.  The closer the procedure is to the spine, the more serious the risks are.  Great care is taken when placing the radio frequency needles, block needles or lesioning probes, but sometimes complications can occur. 1. Infection: Any time there is an injection through the skin, there is a risk of infection.  This is why sterile conditions are used for these blocks.  There are four possible types of infection. 1. Localized skin infection. 2. Central Nervous System Infection-This can be in the form of Meningitis, which can be deadly. 3. Epidural Infections-This can be in the form of an epidural abscess, which can cause pressure inside of the spine, causing compression of the spinal cord with subsequent paralysis. This would require an emergency surgery to decompress, and there are no guarantees that the patient would recover from the paralysis. 4. Discitis-This is an infection of the intervertebral discs.  It occurs in about 1% of discography procedures.  It is difficult to treat and it may lead to surgery.        2. Pain: the needles have to go through skin and  soft tissues, will cause soreness.       3. Damage to internal structures:  The nerves to be lesioned may be near blood vessels or    other nerves which can be potentially damaged.       4. Bleeding: Bleeding is more common if the patient is taking blood thinners such as  aspirin, Coumadin, Ticiid, Plavix, etc., or if he/she have some genetic predisposition  such as hemophilia. Bleeding into the spinal canal can cause compression of the spinal  cord with subsequent paralysis.  This would require an emergency surgery to  decompress and there are no guarantees that the patient would recover from the  paralysis.       5. Pneumothorax:  Puncturing of a lung is a possibility, every time a needle is introduced in  the area of the chest or upper back.  Pneumothorax refers to free air around the  collapsed lung(s), inside of the thoracic cavity (chest cavity).  Another two possible  complications related to a similar event would include: Hemothorax and Chylothorax.   These are variations of the Pneumothorax, where instead of air around the collapsed  lung(s), you may have blood or chyle, respectively.       6. Spinal headaches: They may occur with any procedures in the area of the spine.       7. Persistent CSF (Cerebro-Spinal Fluid) leakage: This is a rare problem, but may occur  with prolonged intrathecal or epidural catheters either due to the formation of a fistulous  track or a dural tear.       8. Nerve damage: By working so close to the spinal cord, there is always a possibility of  nerve damage, which could be as serious as a permanent spinal cord injury with  paralysis.       9. Death:  Although rare, severe deadly allergic reactions known as "Anaphylactic  reaction" can occur to any of the medications used.      10. Worsening of the symptoms:  We can always make thing worse.  What are the chances of something like this happening? Chances of any of this occuring are extremely low.  By statistics, you  have more of a chance of getting killed in a motor vehicle accident: while driving to the hospital than any of the above occurring .  Nevertheless, you should be aware that they are possibilities.  In general, it is similar to taking a shower.  Everybody knows that you can slip, hit your head and get killed.  Does that mean that you should not shower again?  Nevertheless always keep in mind that statistics do not mean anything if you happen to be on the wrong side of them.  Even if a procedure has a 1 (one) in a 1,000,000 (million) chance of going wrong, it you happen to be that one..Also, keep in mind that by statistics, you have more of a chance of having something go wrong when taking medications.  Who should not have this procedure? If you are on a blood thinning medication (e.g. Coumadin, Plavix, see list of "Blood Thinners"), or if you have an active infection going on, you should not have the procedure.  If you are taking any blood thinners, please inform your physician.  How should I prepare for this procedure?  Do not eat or drink anything at least six hours prior to the procedure.  Bring a driver with you .  It cannot be a taxi.  Come accompanied by an adult that can drive you back, and that is strong enough to help you if your legs get weak or numb from the local anesthetic.  Take all of your medicines the morning of the procedure with just enough water to swallow them.  If you have diabetes, make sure that you are scheduled to have your procedure done first thing in the morning, whenever possible.  If you have diabetes, take only half of your insulin dose and notify our nurse that you have done so as soon as you arrive at the clinic.  If you are diabetic, but only take blood sugar pills (oral hypoglycemic), then do not take them on the morning of your procedure.  You may take them after you have had the procedure.  Do not take aspirin or any aspirin-containing medications, at least  eleven (11) days prior  to the procedure.  They may prolong bleeding.  Wear loose fitting clothing that may be easy to take off and that you would not mind if it got stained with Betadine or blood.  Do not wear any jewelry or perfume  Remove any nail coloring.  It will interfere with some of our monitoring equipment.  NOTE: Remember that this is not meant to be interpreted as a complete list of all possible complications.  Unforeseen problems may occur.  BLOOD THINNERS The following drugs contain aspirin or other products, which can cause increased bleeding during surgery and should not be taken for 2 weeks prior to and 1 week after surgery.  If you should need take something for relief of minor pain, you may take acetaminophen which is found in Tylenol,m Datril, Anacin-3 and Panadol. It is not blood thinner. The products listed below are.  Do not take any of the products listed below in addition to any listed on your instruction sheet.  A.P.C or A.P.C with Codeine Codeine Phosphate Capsules #3 Ibuprofen Ridaura  ABC compound Congesprin Imuran rimadil  Advil Cope Indocin Robaxisal  Alka-Seltzer Effervescent Pain Reliever and Antacid Coricidin or Coricidin-D  Indomethacin Rufen  Alka-Seltzer plus Cold Medicine Cosprin Ketoprofen S-A-C Tablets  Anacin Analgesic Tablets or Capsules Coumadin Korlgesic Salflex  Anacin Extra Strength Analgesic tablets or capsules CP-2 Tablets Lanoril Salicylate  Anaprox Cuprimine Capsules Levenox Salocol  Anexsia-D Dalteparin Magan Salsalate  Anodynos Darvon compound Magnesium Salicylate Sine-off  Ansaid Dasin Capsules Magsal Sodium Salicylate  Anturane Depen Capsules Marnal Soma  APF Arthritis pain formula Dewitt's Pills Measurin Stanback  Argesic Dia-Gesic Meclofenamic Sulfinpyrazone  Arthritis Bayer Timed Release Aspirin Diclofenac Meclomen Sulindac  Arthritis pain formula Anacin Dicumarol Medipren Supac  Analgesic (Safety coated) Arthralgen Diffunasal  Mefanamic Suprofen  Arthritis Strength Bufferin Dihydrocodeine Mepro Compound Suprol  Arthropan liquid Dopirydamole Methcarbomol with Aspirin Synalgos  ASA tablets/Enseals Disalcid Micrainin Tagament  Ascriptin Doan's Midol Talwin  Ascriptin A/D Dolene Mobidin Tanderil  Ascriptin Extra Strength Dolobid Moblgesic Ticlid  Ascriptin with Codeine Doloprin or Doloprin with Codeine Momentum Tolectin  Asperbuf Duoprin Mono-gesic Trendar  Aspergum Duradyne Motrin or Motrin IB Triminicin  Aspirin plain, buffered or enteric coated Durasal Myochrisine Trigesic  Aspirin Suppositories Easprin Nalfon Trillsate  Aspirin with Codeine Ecotrin Regular or Extra Strength Naprosyn Uracel  Atromid-S Efficin Naproxen Ursinus  Auranofin Capsules Elmiron Neocylate Vanquish  Axotal Emagrin Norgesic Verin  Azathioprine Empirin or Empirin with Codeine Normiflo Vitamin E  Azolid Emprazil Nuprin Voltaren  Bayer Aspirin plain, buffered or children's or timed BC Tablets or powders Encaprin Orgaran Warfarin Sodium  Buff-a-Comp Enoxaparin Orudis Zorpin  Buff-a-Comp with Codeine Equegesic Os-Cal-Gesic   Buffaprin Excedrin plain, buffered or Extra Strength Oxalid   Bufferin Arthritis Strength Feldene Oxphenbutazone   Bufferin plain or Extra Strength Feldene Capsules Oxycodone with Aspirin   Bufferin with Codeine Fenoprofen Fenoprofen Pabalate or Pabalate-SF   Buffets II Flogesic Panagesic   Buffinol plain or Extra Strength Florinal or Florinal with Codeine Panwarfarin   Buf-Tabs Flurbiprofen Penicillamine   Butalbital Compound Four-way cold tablets Penicillin   Butazolidin Fragmin Pepto-Bismol   Carbenicillin Geminisyn Percodan   Carna Arthritis Reliever Geopen Persantine   Carprofen Gold's salt Persistin   Chloramphenicol Goody's Phenylbutazone   Chloromycetin Haltrain Piroxlcam   Clmetidine heparin Plaquenil   Cllnoril Hyco-pap Ponstel   Clofibrate Hydroxy chloroquine Propoxyphen         Before stopping any of  these medications, be sure to consult the physician who ordered them.  Some, such as Coumadin (Warfarin) are ordered to prevent or treat serious conditions such as "deep thrombosis", "pumonary embolisms", and other heart problems.  The amount of time that you may need off of the medication may also vary with the medication and the reason for which you were taking it.  If you are taking any of these medications, please make sure you notify your pain physician before you undergo any procedures.

## 2017-03-07 NOTE — Progress Notes (Signed)
Safety precautions to be maintained throughout the outpatient stay will include: orient to surroundings, keep bed in low position, maintain call bell within reach at all times, provide assistance with transfer out of bed and ambulation.  

## 2017-03-07 NOTE — Telephone Encounter (Signed)
Spoke with [facility] and they are requesting Home PT for stretching and strengthening for chronic back pain.  Will notify Dr Dossie Arbour when patient arrives today .

## 2017-03-07 NOTE — Telephone Encounter (Signed)
[facility] for [name] called stating patient is in a lot of pain and would like to get some home PT. Could you please call Maudie Mercury at [phone] to get this started. Patient Kristin [name] phone [phone]

## 2017-03-07 NOTE — Progress Notes (Signed)
Patient's Name: Kristin [name]  MRN: [id number]  Referring Provider: Mikey College, *  DOB: 07-18-1930  PCP: Mikey College, NP  DOS: 03/07/2017  Note by: Gaspar Cola, MD  Service setting: Ambulatory outpatient  Specialty: Interventional Pain Management  Patient type: Established  Location: [facility] (AMB) Pain Management Facility  Visit type: Interventional Procedure   Primary Reason for Visit: Interventional Pain Management Treatment. CC: Shoulder Pain (right) and Neck Pain (right)  Procedure:  Anesthesia, Analgesia, Anxiolysis:  Type: Diagnostic Suprascapular nerve Block Region: Posterior Shoulder & Scapular Areas Level: Superior to the scapular spine, in the lateral aspect of the supraspinatus fossa (Suprescapular notch). Laterality: Right-Side  Type: Local Anesthesia with Moderate (Conscious) Sedation Local Anesthetic: Lidocaine 1% Route: Intravenous (IV) IV Access: Secured Sedation: Meaningful verbal contact was maintained at all times during the procedure  Indication(s): Analgesia and Anxiety  Indications: 1. Chronic shoulder pain (S/P replacement) (Right)   2. Shoulder pain S/P prostheses (Right)   3. Chronic low back pain (Location of Primary Source of Pain) (Bilateral) (L>R)   4. Chronic shoulder pain (Bilateral)    Pain Score: Pre-procedure: 5 /10 Post-procedure: 0-No pain/10  Pre-op Assessment:  Previous date of service: 01/24/17 Service provided: Evaluation (post procedure evaluation with Dr. Dossie Arbour) Kristin [name] is a 83 y.o. (year old), female patient, seen today for interventional treatment. She  has a past surgical history that includes Abdominal hysterectomy; Back surgery; Rotator cuff repair; Breast surgery; Cataract extraction w/PHACO (10/19/2011); Cataract extraction w/PHACO (11/02/2011); Carpal tunnel release; Joint replacement; Colon surgery; Pacemaker insertion (N/A, 12/24/2015); and Cholecystectomy. Kristin [name] has a current medication list which  includes the following prescription(s): albuterol, apixaban, bisacodyl, vitamin d, furosemide, levocetirizine, lisinopril, oxycodone hcl, oxycodone hcl, oxygen-helium, polyethylene glycol, ropinirole, and oxycodone hcl, and the following Facility-Administered Medications: fentanyl and midazolam. Her primarily concern today is the Shoulder Pain (right) and Neck Pain (right)  Initial Vital Signs: Blood pressure 114/74, pulse 60, temperature 97.8 F (36.6 C), resp. rate 12, height 5\' 8"  (1.727 m), weight 158 lb (71.7 kg), SpO2 98 %. BMI: 24.02 kg/m  Risk Assessment: Allergies: Reviewed. She is allergic to cyclobenzaprine; flexeril [cyclobenzaprine hcl]; and vancomycin.  Allergy Precautions: None required Coagulopathies: Reviewed. None identified.  Blood-thinner therapy: None at this time Active Infection(s): Reviewed. None identified. Kristin [name] is afebrile  Site Confirmation: Kristin [name] was asked to confirm the procedure and laterality before marking the site Procedure checklist: Completed Consent: Before the procedure and under the influence of no sedative(s), amnesic(s), or anxiolytics, the patient was informed of the treatment options, risks and possible complications. To fulfill our ethical and legal obligations, as recommended by the American Medical Association's Code of Ethics, I have informed the patient of my clinical impression; the nature and purpose of the treatment or procedure; the risks, benefits, and possible complications of the intervention; the alternatives, including doing nothing; the risk(s) and benefit(s) of the alternative treatment(s) or procedure(s); and the risk(s) and benefit(s) of doing nothing. The patient was provided information about the general risks and possible complications associated with the procedure. These may include, but are not limited to: failure to achieve desired goals, infection, bleeding, organ or nerve damage, allergic reactions, paralysis, and  death. In addition, the patient was informed of those risks and complications associated to the procedure, such as failure to decrease pain; infection; bleeding; organ or nerve damage with subsequent damage to sensory, motor, and/or autonomic systems, resulting in permanent pain, numbness, and/or weakness of one or several areas of  the body; allergic reactions; (i.e.: anaphylactic reaction); and/or death. Furthermore, the patient was informed of those risks and complications associated with the medications. These include, but are not limited to: allergic reactions (i.e.: anaphylactic or anaphylactoid reaction(s)); adrenal axis suppression; blood sugar elevation that in diabetics may result in ketoacidosis or comma; water retention that in patients with history of congestive heart failure may result in shortness of breath, pulmonary edema, and decompensation with resultant heart failure; weight gain; swelling or edema; medication-induced neural toxicity; particulate matter embolism and blood vessel occlusion with resultant organ, and/or nervous system infarction; and/or aseptic necrosis of one or more joints. Finally, the patient was informed that Medicine is not an exact science; therefore, there is also the possibility of unforeseen or unpredictable risks and/or possible complications that may result in a catastrophic outcome. The patient indicated having understood very clearly. We have given the patient no guarantees and we have made no promises. Enough time was given to the patient to ask questions, all of which were answered to the patient's satisfaction. Kristin [name] has indicated that she wanted to continue with the procedure. Attestation: I, the ordering provider, attest that I have discussed with the patient the benefits, risks, side-effects, alternatives, likelihood of achieving goals, and potential problems during recovery for the procedure that I have provided informed consent. Date: 03/07/2017; Time:  11:36 AM  Pre-Procedure Preparation:  Monitoring: As per clinic protocol. Respiration, ETCO2, SpO2, BP, heart rate and rhythm monitor placed and checked for adequate function Safety Precautions: Patient was assessed for positional comfort and pressure points before starting the procedure. Time-out: I initiated and conducted the "Time-out" before starting the procedure, as per protocol. The patient was asked to participate by confirming the accuracy of the "Time Out" information. Verification of the correct person, site, and procedure were performed and confirmed by me, the nursing staff, and the patient. "Time-out" conducted as per Joint Commission's Universal Protocol (UP.01.01.01). "Time-out" Date & Time: 03/07/2017; 1139 hrs.  Description of Procedure Process:   Position: Prone Target Area: Suprascapular notch. Approach: Posterior approach. Area Prepped: Entire shoulder Area Prepping solution: ChloraPrep (2% chlorhexidine gluconate and 70% isopropyl alcohol) Safety Precautions: Aspiration looking for blood return was conducted prior to all injections. At no point did we inject any substances, as a needle was being advanced. No attempts were made at seeking any paresthesias. Safe injection practices and needle disposal techniques used. Medications properly checked for expiration dates. SDV (single dose vial) medications used. Description of the Procedure: Protocol guidelines were followed. The patient was placed in position over the procedure table. The target area was identified and the area prepped in the usual manner. Skin & deeper tissues infiltrated with local anesthetic. Appropriate amount of time allowed to pass for local anesthetics to take effect. The procedure needles were then advanced to the target area. Proper needle placement secured. Negative aspiration confirmed. Solution injected in intermittent fashion, asking for systemic symptoms every 0.5cc of injectate. The needles were then  removed and the area cleansed, making sure to leave some of the prepping solution back to take advantage of its long term bactericidal properties. Vitals:   03/07/17 1155 03/07/17 1200 03/07/17 1210 03/07/17 1220  BP: 120/71 119/72 117/71 119/72  Pulse: 61 60 62 61  Resp: 14 14 14 14   Temp:  (!) 96.2 F (35.7 C)    SpO2: (!) 89% 96% 94% 96%  Weight:      Height:        Start Time: 1139 hrs. End  Time: 1149 hrs. Materials:  Needle(s) Type: Regular needle Gauge: 22G Length: 3.5-in Medication(s): We administered midazolam, fentaNYL, lidocaine, methylPREDNISolone acetate, and ropivacaine (PF) 2 mg/mL (0.2%). Please see chart orders for dosing details.  Imaging Guidance (Non-Spinal):  Type of Imaging Technique: Fluoroscopy Guidance (Non-Spinal) Indication(s): Assistance in needle guidance and placement for procedures requiring needle placement in or near specific anatomical locations not easily accessible without such assistance. Exposure Time: Please see nurses notes. Contrast: Before injecting any contrast, we confirmed that the patient did not have an allergy to iodine, shellfish, or radiological contrast. Once satisfactory needle placement was completed at the desired level, radiological contrast was injected. Contrast injected under live fluoroscopy. No contrast complications. See chart for type and volume of contrast used. Fluoroscopic Guidance: I was personally present during the use of fluoroscopy. "Tunnel Vision Technique" used to obtain the best possible view of the target area. Parallax error corrected before commencing the procedure. "Direction-depth-direction" technique used to introduce the needle under continuous pulsed fluoroscopy. Once target was reached, antero-posterior, oblique, and lateral fluoroscopic projection used confirm needle placement in all planes. Images permanently stored in EMR. Interpretation: I personally interpreted the imaging intraoperatively. Adequate needle  placement confirmed in multiple planes. Appropriate spread of contrast into desired area was observed. No evidence of afferent or efferent intravascular uptake. Permanent images saved into the patient's record.  Antibiotic Prophylaxis:  Indication(s): None identified Antibiotic given: None  Post-operative Assessment:  EBL: None Complications: No immediate post-treatment complications observed by team, or reported by patient. Note: The patient tolerated the entire procedure well. A repeat set of vitals were taken after the procedure and the patient was kept under observation following institutional policy, for this type of procedure. Post-procedural neurological assessment was performed, showing return to baseline, prior to discharge. The patient was provided with post-procedure discharge instructions, including a section on how to identify potential problems. Should any problems arise concerning this procedure, the patient was given instructions to immediately contact us, at any time, without hesitation. In any case, we plan to contact the patient by telephone for a follow-up status report regarding this interventional procedure. Comments:  No additional relevant information.  Plan of Care  Disposition: Discharge home  Discharge Date & Time: 03/07/2017; 1226 hrs.  Physician-requested Follow-up:  Return for post-procedure eval (in 2 wks).  Future Appointments Date Time Provider [place]  03/15/2017 12:00 PM Barbaraann Faster, RN [facility] None  03/17/2017 11:00 AM Mikey College, NP [facility] None  04/04/2017 9:00 AM Milinda Pointer, MD [facility] None  04/27/2017 10:20 AM Alisa Graff, FNP [facility] None  04/28/2017 1:00 PM Milinda Pointer, MD [facility] None   Medications ordered for procedure: Meds ordered this encounter  Medications  . midazolam (VERSED) 5 MG/5ML injection 1-2 mg    Make sure Flumazenil is available in the pyxis when using this medication. If  oversedation occurs, administer 0.2 mg IV over 15 sec. If after 45 sec no response, administer 0.2 mg again over 1 min; may repeat at 1 min intervals; not to exceed 4 doses (1 mg)  . fentaNYL (SUBLIMAZE) injection 25-50 mcg    Make sure Narcan is available in the pyxis when using this medication. In the event of respiratory depression (RR< 8/min): Titrate NARCAN (naloxone) in increments of 0.1 to 0.2 mg IV at 2-3 minute intervals, until desired degree of reversal.  . lidocaine 1.5 % injection 20 mL    From block tray.  . methylPREDNISolone acetate (DEPO-MEDROL) injection 80 mg  . ropivacaine (  PF) 2 mg/mL (0.2%) (NAROPIN) injection 9 mL   Medications administered: We administered midazolam, fentaNYL, lidocaine, methylPREDNISolone acetate, and ropivacaine (PF) 2 mg/mL (0.2%).  See the medical record for exact dosing, route, and time of administration.  Lab-work, Procedure(s), & Referral(s) Ordered: Orders Placed This Encounter  Procedures  . SUPRASCAPULAR NERVE BLOCK  . DG C-Arm 1-60 Min-No Report  . Ambulatory referral to Physical Therapy  . Informed Consent Details: Transcribe to consent form and obtain patient signature   Imaging Ordered: Results for orders placed in visit on 12/13/16  DG C-Arm 1-60 Min-No Report   Narrative Fluoroscopy was utilized by the requesting physician.  No radiographic  interpretation.    New Prescriptions   No medications on file   Primary Care Physician: Mikey College, NP Location: [facility] Note by: Gaspar Cola, MD Date: 03/07/2017; Time: 12:54 PM  Disclaimer:  Medicine is not an exact science. The only guarantee in medicine is that nothing is guaranteed. It is important to note that the decision to proceed with this intervention was based on the information collected from the patient. The Data and conclusions were drawn from the patient's questionnaire, the interview, and the physical examination. Because  the information was provided in large part by the patient, it cannot be guaranteed that it has not been purposely or unconsciously manipulated. Every effort has been made to obtain as much relevant data as possible for this evaluation. It is important to note that the conclusions that lead to this procedure are derived in large part from the available data. Always take into account that the treatment will also be dependent on availability of resources and existing treatment guidelines, considered by other Pain Management Practitioners as being common knowledge and practice, at the time of the intervention. For Medico-Legal purposes, it is also important to point out that variation in procedural techniques and pharmacological choices are the acceptable norm. The indications, contraindications, technique, and results of the above procedure should only be interpreted and judged by a Board-Certified Interventional Pain Specialist with extensive familiarity and expertise in the same exact procedure and technique.  Instructions provided at this appointment: Patient Instructions   ____________________________________________________________________________________________  Post-Procedure instructions Instructions:  Apply ice: Fill a plastic sandwich bag with crushed ice. Cover it with a small towel and apply to injection site. Apply for 15 minutes then remove x 15 minutes. Repeat sequence on day of procedure, until you go to bed. The purpose is to minimize swelling and discomfort after procedure.  Apply heat: Apply heat to procedure site starting the day following the procedure. The purpose is to treat any soreness and discomfort from the procedure.  Food intake: Start with clear liquids (like water) and advance to regular food, as tolerated.   Physical activities: Keep activities to a minimum for the first 8 hours after the procedure.   Driving: If you have received any sedation, you are not allowed to  drive for 24 hours after your procedure.  Blood thinner: Restart your blood thinner 6 hours after your procedure. (Only for those taking blood thinners)  Insulin: As soon as you can eat, you may resume your normal dosing schedule. (Only for those taking insulin)  Infection prevention: Keep procedure site clean and dry.  Post-procedure Pain Diary: Extremely important that this be done correctly and accurately. Recorded information will be used to determine the next step in treatment.  Pain evaluated is that of treated area only. Do not include pain from an  untreated area.  Complete every hour, on the hour, for the initial 8 hours. Set an alarm to help you do this part accurately.  Do not go to sleep and have it completed later. It will not be accurate.  Follow-up appointment: Keep your follow-up appointment after the procedure. Usually 2 weeks for most procedures. (6 weeks in the case of radiofrequency.) Bring you pain diary.  Expect:  From numbing medicine (AKA: Local Anesthetics): Numbness or decrease in pain.  Onset: Full effect within 15 minutes of injected.  Duration: It will depend on the type of local anesthetic used. On the average, 1 to 8 hours.   From steroids: Decrease in swelling or inflammation. Once inflammation is improved, relief of the pain will follow.  Onset of benefits: Depends on the amount of swelling present. The more swelling, the longer it will take for the benefits to be seen. In some cases, up to 10 days.  Duration: Steroids will stay in the system x 2 weeks. Duration of benefits will depend on multiple posibilities including persistent irritating factors.  From procedure: Some discomfort is to be expected once the numbing medicine wears off. This should be minimal if ice and heat are applied as instructed. Call if:  You experience numbness and weakness that gets worse with time, as opposed to wearing off.  New onset bowel or bladder incontinence. (Spinal  procedures only)  Emergency Numbers:  [place] hours (Monday - Thursday, 8:00 AM - 4:00 PM) (Friday, 9:00 AM - 12:00 Noon): [phone]  After hours: [phone] ____________________________________________________________________________________________  Celiac Plexus Block Patient Information  Description: The celiac plexus is a group of nerves which are part of the sympathetic nervous system.  These nerves supply organs in the abdomen and pelvis.  Specific organs supplied with sensation by the celiac plexus include the stomach, liver, gallbladder, pancreas, kidneys and part of the gut.   The celiac plexus is located on both sides of the aorta at approximately the level of the first lumbar vertebral body.  The block will be performed with you lying on your abdomen with a pillow underneath.  Using direct x-ray guidance, the celiac plexus will be located on both sides of the spine.  Numbing medicine will be used to deaden the skin prior to needle insertion.  In most cases, a small amount of sedation can be given by IV prior to the numbing medicine.  Two small needles will be place near the celiac plexus and local anesthetic and steroid will be injected.  The entire block usually last about 15-25 minutes.  Conditions which may be treated by celiac plexus block:   Acute and chronic pancreatitis  Pain from liver or pancreatic cancer  Pain from Crohn's disease  Other types of abdominal or flank pain  Preparation for the injection:  1. Do not eat any solid food or dairy products within 8 hours of your appointment. 2. You may drink clear liquids up to 3 hours before appointment.  Clear liquids include water, black coffee, juice or soda.  No milk or cream please. 3. You may take your regular medication, including pain medications, with a sip of water before your appointment.  Diabetics should hold regular insulin (if taken separately) and take 1/2 normal NPH dose in the morning  of the procedure.  Carry some sugar containing items with you to your appointment. 4. A driver must accompany you and be prepared to drive you home after your procedure. 5. Bring all your current medications  with you. 6. An IV may be inserted and sedation may be given at the discretion of the physician. 7. A blood pressure cuff, EKG, and other monitors will often be applied during the procedure.  Some patients may need to have extra oxygen administered for a short period. 8. You will be asked to provide medical information, including your allergies and medications, prior to the procedure.  We must know immediately if you are taking blood thinners (like Coumadin/Warfarin) or if you are allergic to IV iodine contrast (dye).  We must know if you could possible be pregnant.  Possible side-effects:   Bleeding from needle site or deeper  Infection (rarre, can require surgery)  Nerve injury (rare)  Numbness & tingling (temporary)  Collapsed lung (rare)  Spinal headache ( a headache worse with upright posture)  Light-headedness (temporary)  Pain at injection site (several days)  Decreased blood pressure (temporary)  Weakness in legs (temporary)  Seizure or other drug reaction (rare)  Call if you experience:   Fever/chills associated with headache or increased back/neck pain  Headache worsened by an upright position  New onset weakness or numbness of an extremity below the injection site.  Hives or difficulty breathing (go to the emergency room)  Inflammation or drainage at the injection site.  New onset diarrhea lasting more than 2 weeks.  New symptoms which are concerning to you  Please note:  If effective, we will often do a series of 2-3 injections spaced 3-6 weeks apart to maximally decrease your pain.  If initial series is effective, you may be a candidate for a more permanent block of the celiac plexus. .  If you have questions, please call [phone] [facility]    Pain Management Discharge Instructions  General Discharge Instructions :  If you need to reach your doctor call: Monday-Friday 8:00 am - 4:00 pm at [phone] or toll free [phone].  After clinic hours [phone] to have operator reach doctor.  Bring all of your medication bottles to all your appointments in the pain clinic.  To cancel or reschedule your appointment with Pain Management please remember to call 24 hours in advance to avoid a fee.  Refer to the educational materials which you have been given on: General Risks, I had my Procedure. Discharge Instructions, Post Sedation.  Post Procedure Instructions:  The drugs you were given will stay in your system until tomorrow, so for the next 24 hours you should not drive, make any legal decisions or drink any alcoholic beverages.  You may eat anything you prefer, but it is better to start with liquids then soups and crackers, and gradually work up to solid foods.  Please notify your doctor immediately if you have any unusual bleeding, trouble breathing or pain that is not related to your normal pain.  Depending on the type of procedure that was done, some parts of your body may feel week and/or numb.  This usually clears up by tonight or the next day.  Walk with the use of an assistive device or accompanied by an adult for the 24 hours.  You may use ice on the affected area for the first 24 hours.  Put ice in a Ziploc bag and cover with a towel and place against area 15 minutes on 15 minutes off.  You may switch to heat after 24 hours.GENERAL RISKS AND COMPLICATIONS  What are the risk, side effects and possible complications? Generally speaking, most procedures are safe.  However, with any procedure there are risks, side effects, and the possibility of complications.  The risks and complications are dependent upon the sites that are lesioned, or the type of nerve block to be performed.   The closer the procedure is to the spine, the more serious the risks are.  Great care is taken when placing the radio frequency needles, block needles or lesioning probes, but sometimes complications can occur. 1. Infection: Any time there is an injection through the skin, there is a risk of infection.  This is why sterile conditions are used for these blocks.  There are four possible types of infection. 1. Localized skin infection. 2. Central Nervous System Infection-This can be in the form of Meningitis, which can be deadly. 3. Epidural Infections-This can be in the form of an epidural abscess, which can cause pressure inside of the spine, causing compression of the spinal cord with subsequent paralysis. This would require an emergency surgery to decompress, and there are no guarantees that the patient would recover from the paralysis. 4. Discitis-This is an infection of the intervertebral discs.  It occurs in about 1% of discography procedures.  It is difficult to treat and it may lead to surgery.        2. Pain: the needles have to go through skin and soft tissues, will cause soreness.       3. Damage to internal structures:  The nerves to be lesioned may be near blood vessels or    other nerves which can be potentially damaged.       4. Bleeding: Bleeding is more common if the patient is taking blood thinners such as  aspirin, Coumadin, Ticiid, Plavix, etc., or if he/she have some genetic predisposition  such as hemophilia. Bleeding into the spinal canal can cause compression of the spinal  cord with subsequent paralysis.  This would require an emergency surgery to  decompress and there are no guarantees that the patient would recover from the  paralysis.       5. Pneumothorax:  Puncturing of a lung is a possibility, every time a needle is introduced in  the area of the chest or upper back.  Pneumothorax refers to free air around the  collapsed lung(s), inside of the thoracic cavity (chest cavity).   Another two possible  complications related to a similar event would include: Hemothorax and Chylothorax.   These are variations of the Pneumothorax, where instead of air around the collapsed  lung(s), you may have blood or chyle, respectively.       6. Spinal headaches: They may occur with any procedures in the area of the spine.       7. Persistent CSF (Cerebro-Spinal Fluid) leakage: This is a rare problem, but may occur  with prolonged intrathecal or epidural catheters either due to the formation of a fistulous  track or a dural tear.       8. Nerve damage: By working so close to the spinal cord, there is always a possibility of  nerve damage, which could be as serious as a permanent spinal cord injury with  paralysis.       9. Death:  Although rare, severe deadly allergic reactions known as "Anaphylactic  reaction" can occur to any of the medications used.      10. Worsening of the symptoms:  We can always make thing worse.  What are the chances of something like this happening? Chances of any of this occuring are extremely low.  By statistics, you have more of a chance of getting killed in a motor vehicle accident: while driving to the hospital than any of the above occurring .  Nevertheless, you should be aware that they are possibilities.  In general, it is similar to taking a shower.  Everybody knows that you can slip, hit your head and get killed.  Does that mean that you should not shower again?  Nevertheless always keep in mind that statistics do not mean anything if you happen to be on the wrong side of them.  Even if a procedure has a 1 (one) in a 1,000,000 (million) chance of going wrong, it you happen to be that one..Also, keep in mind that by statistics, you have more of a chance of having something go wrong when taking medications.  Who should not have this procedure? If you are on a blood thinning medication (e.g. Coumadin, Plavix, see list of "Blood Thinners"), or if you have an active  infection going on, you should not have the procedure.  If you are taking any blood thinners, please inform your physician.  How should I prepare for this procedure?  Do not eat or drink anything at least six hours prior to the procedure.  Bring a driver with you .  It cannot be a taxi.  Come accompanied by an adult that can drive you back, and that is strong enough to help you if your legs get weak or numb from the local anesthetic.  Take all of your medicines the morning of the procedure with just enough water to swallow them.  If you have diabetes, make sure that you are scheduled to have your procedure done first thing in the morning, whenever possible.  If you have diabetes, take only half of your insulin dose and notify our nurse that you have done so as soon as you arrive at the clinic.  If you are diabetic, but only take blood sugar pills (oral hypoglycemic), then do not take them on the morning of your procedure.  You may take them after you have had the procedure.  Do not take aspirin or any aspirin-containing medications, at least eleven (11) days prior to the procedure.  They may prolong bleeding.  Wear loose fitting clothing that may be easy to take off and that you would not mind if it got stained with Betadine or blood.  Do not wear any jewelry or perfume  Remove any nail coloring.  It will interfere with some of our monitoring equipment.  NOTE: Remember that this is not meant to be interpreted as a complete list of all possible complications.  Unforeseen problems may occur.  BLOOD THINNERS The following drugs contain aspirin or other products, which can cause increased bleeding during surgery and should not be taken for 2 weeks prior to and 1 week after surgery.  If you should need take something for relief of minor pain, you may take acetaminophen which is found in Tylenol,m Datril, Anacin-3 and Panadol. It is not blood thinner. The products listed below are.  Do not  take any of the products listed below in addition to any listed on your instruction sheet.  A.P.C or A.P.C with Codeine Codeine Phosphate Capsules #3 Ibuprofen Ridaura  ABC compound Congesprin Imuran rimadil  Advil Cope Indocin Robaxisal  Alka-Seltzer Effervescent Pain Reliever and Antacid Coricidin or Coricidin-D  Indomethacin Rufen  Alka-Seltzer plus Cold Medicine Cosprin Ketoprofen S-A-C Tablets  Anacin Analgesic Tablets or Capsules Coumadin Korlgesic Salflex  Anacin  Extra Strength Analgesic tablets or capsules CP-2 Tablets Lanoril Salicylate  Anaprox Cuprimine Capsules Levenox Salocol  Anexsia-D Dalteparin Magan Salsalate  Anodynos Darvon compound Magnesium Salicylate Sine-off  Ansaid Dasin Capsules Magsal Sodium Salicylate  Anturane Depen Capsules Marnal Soma  APF Arthritis pain formula Dewitt's Pills Measurin Stanback  Argesic Dia-Gesic Meclofenamic Sulfinpyrazone  Arthritis Bayer Timed Release Aspirin Diclofenac Meclomen Sulindac  Arthritis pain formula Anacin Dicumarol Medipren Supac  Analgesic (Safety coated) Arthralgen Diffunasal Mefanamic Suprofen  Arthritis Strength Bufferin Dihydrocodeine Mepro Compound Suprol  Arthropan liquid Dopirydamole Methcarbomol with Aspirin Synalgos  ASA tablets/Enseals Disalcid Micrainin Tagament  Ascriptin Doan's Midol Talwin  Ascriptin A/D Dolene Mobidin Tanderil  Ascriptin Extra Strength Dolobid Moblgesic Ticlid  Ascriptin with Codeine Doloprin or Doloprin with Codeine Momentum Tolectin  Asperbuf Duoprin Mono-gesic Trendar  Aspergum Duradyne Motrin or Motrin IB Triminicin  Aspirin plain, buffered or enteric coated Durasal Myochrisine Trigesic  Aspirin Suppositories Easprin Nalfon Trillsate  Aspirin with Codeine Ecotrin Regular or Extra Strength Naprosyn Uracel  Atromid-S Efficin Naproxen Ursinus  Auranofin Capsules Elmiron Neocylate Vanquish  Axotal Emagrin Norgesic Verin  Azathioprine Empirin or Empirin with Codeine Normiflo Vitamin E   Azolid Emprazil Nuprin Voltaren  Bayer Aspirin plain, buffered or children's or timed BC Tablets or powders Encaprin Orgaran Warfarin Sodium  Buff-a-Comp Enoxaparin Orudis Zorpin  Buff-a-Comp with Codeine Equegesic Os-Cal-Gesic   Buffaprin Excedrin plain, buffered or Extra Strength Oxalid   Bufferin Arthritis Strength Feldene Oxphenbutazone   Bufferin plain or Extra Strength Feldene Capsules Oxycodone with Aspirin   Bufferin with Codeine Fenoprofen Fenoprofen Pabalate or Pabalate-SF   Buffets II Flogesic Panagesic   Buffinol plain or Extra Strength Florinal or Florinal with Codeine Panwarfarin   Buf-Tabs Flurbiprofen Penicillamine   Butalbital Compound Four-way cold tablets Penicillin   Butazolidin Fragmin Pepto-Bismol   Carbenicillin Geminisyn Percodan   Carna Arthritis Reliever Geopen Persantine   Carprofen Gold's salt Persistin   Chloramphenicol Goody's Phenylbutazone   Chloromycetin Haltrain Piroxlcam   Clmetidine heparin Plaquenil   Cllnoril Hyco-pap Ponstel   Clofibrate Hydroxy chloroquine Propoxyphen         Before stopping any of these medications, be sure to consult the physician who ordered them.  Some, such as Coumadin (Warfarin) are ordered to prevent or treat serious conditions such as "deep thrombosis", "pumonary embolisms", and other heart problems.  The amount of time that you may need off of the medication may also vary with the medication and the reason for which you were taking it.  If you are taking any of these medications, please make sure you notify your pain physician before you undergo any procedures.

## 2017-03-07 NOTE — Progress Notes (Signed)
Results were reviewed and found to be: significantly abnormal  Further testing may be useful  Review would suggest no procedures needed at this time

## 2017-03-08 ENCOUNTER — Ambulatory Visit: Payer: Medicare Other

## 2017-03-08 ENCOUNTER — Telehealth: Payer: Self-pay

## 2017-03-08 NOTE — Telephone Encounter (Signed)
Post procedure phone call.  Unable to leave messsage.  No voicemail set up.

## 2017-03-09 ENCOUNTER — Other Ambulatory Visit: Payer: Self-pay | Admitting: Pain Medicine

## 2017-03-09 DIAGNOSIS — G2581 Restless legs syndrome: Secondary | ICD-10-CM

## 2017-03-10 ENCOUNTER — Other Ambulatory Visit: Payer: Self-pay

## 2017-03-10 MED ORDER — FUROSEMIDE 40 MG PO TABS: 40.0000 mg | ORAL_TABLET | Freq: Every day | ORAL | 1 refills | Status: DC

## 2017-03-11 ENCOUNTER — Telehealth: Payer: Self-pay

## 2017-03-11 NOTE — Telephone Encounter (Signed)
Called to reschedule AWV - left message

## 2017-03-15 ENCOUNTER — Telehealth: Payer: Self-pay | Admitting: Pain Medicine

## 2017-03-15 ENCOUNTER — Other Ambulatory Visit: Payer: Self-pay | Admitting: *Deleted

## 2017-03-15 NOTE — Telephone Encounter (Signed)
Spoke to patient and pharmacy.  Pharmacy states that her medication has been filled and ready for her to pick up.  Will call patient and notify her.

## 2017-03-15 NOTE — Patient Outreach (Signed)
[facility]) Care Management  [place]  03/15/2017   DEZARAE MCCLARAN 1930/03/15 [id number]   ELGIE LANDINO is an 83 y.o. female PMH of Chronic diastolic heart failure, COPD, HTN, Chronic kidney disease (CKD), stage III (moderate), s/p pacemaker, CAFL (chronic airflow limitation),Complete rotator cuff rupture of shoulder (Left); DDD (degenerative disc disease), lumbosacral; Gout; H/O cardiac catheterization; Arrhythmia, sinus node (HCC,Lumbar canal stenosis; Urinary frequency/UTI,Seasonal allergies; Breast cancer ofright breast, Chronic pain syndrome; Chronic low back pain, Restless leg syndrome, Neurogenic pain; Long term current use of opiate analgesic; Chronic Opioid-induced constipation, Vitamin D insufficiency, 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 S/p laparoscopic cholecystectomy on 11/20/2016 and ERCP for stone removal, biliary sphincterotomy after an admission on 11/16/2016 to [facility] with acute cholecystitis complicated by choledocholithiasis and E.coli bacteremia. When she was discharged on 11/22/16, her home lasix and lisinopril were held due to mild AKI and hypotension during hospitalization.   Ms Rowles was referred to [place] on 12/01/16 for complex case management (for recent hospital discharge, post surgery, hx heart failure-pacemaker; having hypotension that requires managing medication; patient is falls risk) after contacted by [facility] telephonic CM, L Manning. Ms Amadi states hergrand daughter manages her medications and takes her blood pressure twice daily and helps her with personal care. States son &grand daughter assist with meals &take her to MD appointments.   This [facility] CM made initial contact on 12/03/16 and first home visit on 12/07/16. She has been visited in her home x 3 plus had  [facility] CM telephonic assessments.  She has not had a re admission since her April 2018 [place] discharge (3 months ago)  Subjective:  "I am doing good today" "I am not having any problems except my back and its been going on for a long time"  Objective:   Encounter Medications:  Outpatient Encounter Prescriptions as of 03/15/2017  Medication Sig Note  . albuterol (PROVENTIL) (2.5 MG/3ML) 0.083% nebulizer solution Take 3 mLs (2.5 mg total) by nebulization every 8 (eight) hours as needed. Shortness of breath and wheezing   . apixaban (ELIQUIS) 5 MG TABS tablet Take 1 tablet (5 mg total) by mouth 2 (two) times daily.   . Cholecalciferol (VITAMIN D) 2000 units tablet Take 2,000 Units by mouth daily.    . furosemide (LASIX) 40 MG tablet Take 1 tablet (40 mg total) by mouth daily.   Marland Kitchen levocetirizine (XYZAL) 5 MG tablet Take 1 tablet (5 mg total) by mouth every evening.   Marland Kitchen lisinopril (PRINIVIL,ZESTRIL) 10 MG tablet TAKE 1 TABLET BY MOUTH ONCE DAILY.   Marland Kitchen Oxycodone HCl 20 MG TABS Take 1 tablet (20 mg total) by mouth every 8 (eight) hours as needed.   Derrill Memo ON 04/12/2017] Oxycodone HCl 20 MG TABS Take 1 tablet (20 mg total) by mouth every 8 (eight) hours as needed.   . OXYGEN Inhale 2 Doses into the lungs at bedtime.   . polyethylene glycol (MIRALAX / GLYCOLAX) packet Take 17 g by mouth daily as needed.   Marland Kitchen rOPINIRole (REQUIP) 2 MG tablet Take 1 tablet (2 mg total) by mouth at bedtime.   . bisacodyl (DULCOLAX) 5 MG EC tablet Take 2 tablets (10 mg total) by mouth daily as needed for moderate constipation.   . Oxycodone HCl 20 MG TABS Take 1 tablet (20 mg total) by mouth every 8 (eight) hours as needed. 11/11/2016:  DO NOT DELETE, even if Expired!!! See Dr. Adalberto Cole care coordination note.   No facility-administered encounter medications on file as of 03/15/2017.     Functional Status:  In your present state of health, do you have any difficulty performing the following activities: 03/15/2017 01/24/2017   Hearing? - Y  Vision? N N  Difficulty concentrating or making decisions? N Y  Walking or climbing stairs? Y Y  Dressing or bathing? N N  Doing errands, shopping? N N  Preparing Food and eating ? N -  Using the Toilet? N -  In the past six months, have you accidently leaked urine? N -  Do you have problems with loss of bowel control? N -  Managing your Medications? N -  Managing your Finances? N -  Housekeeping or managing your Housekeeping? N -  Some recent data might be hidden    Fall/Depression Screening: Fall Risk  03/07/2017 03/01/2017 01/24/2017  Falls in the past year? No No No  Risk for fall due to : - - -   PHQ 2/9 Scores 03/15/2017 03/07/2017 03/01/2017 01/24/2017 01/24/2017 12/24/2016 12/21/2016  PHQ - 2 Score 0 0 0 0 0 0 0  Exception Documentation - - - - - - -    Assessment:  [facility] CM spoke with Mrs Morton for telephonic assessment and to follow up on her management of her back pain.  Mrs Edds reports that she did discussed further interventions for pain management to include possible PT services but has decided she would not prefer further PT sessions.  She reports she did receive an injection for her shoulder pain during her pain management visit but reports the "shot did not help" Mrs Bronkema discussed that there is a pain management treatment plan and her provider had discontinued oxycodone with "tylenol" which she states "worked better" and now she is taking Oxycodone 20 mg that she reports is not effective for her pain.  States her provider does not want "to go back to it" (oxycodone with tylenol).  She is able to inform [facility] CM that the provider stated the reason her provider informed her he did not want to use the oxycodone with tylenol.  [facility] CM discussed if the change was only for oxycodone without tylenol that she should consider asking her provider if there are any contraindications why she could take tylenol for break through pain 1-2 hours after she takes the prescribed  oxycodone 20 mg.  She voices she will ask her provider     No other medication changes per Mrs Jaroszewski and contact has been received from [facility] pharmacist of [facility] pharmacy case closure on 02/07/17  Mrs Ende reports a low BP when checking at home Reports systolic in 814G.  [facility] CM discussed monitoring for dizziness and calling her pcp to see if she needs to hold BP medication at intervals until her systolic BP is within the ordered normal limits to prevent further lowering the BP, dizziness or falls.  She agreed to ask her pcp about this.  Mrs Bauza continues to report issues with constipation but is aware and able to inform [facility] CM this is related to her intake of narcotic pain medications and it will be an ongoing issue.  Mrs Cuadros is also able to review the medications on her medication list and why she takes them to manage her bowel elimination  Mrs Mungo does not prefer to proceed with addressing issues with [facility] discharge concerns (re admission medical necessity) addressed by [place]  during the visits in April 2018   Confirms she is scheduled for a pcp appointment on 03/17/17 - follow up with pain management on 04/04/17    Plan:  [facility] CM will follow up with Mrs Critzer in 4 weeks as agreed by Mrs Dolbow to see if further needs and plan for potential discharge from program at that time Routed this note to all medical care team members listed in EPIC for Mrs Zenner   [facility] CM Care Plan Problem One     Most Recent Value  Care Plan Problem One  Knowledge deficit of congestive heart failure home management (signs/symptoms, treatment interventions  Role Documenting the Problem One  Care Management Coordinator  Care Plan for Problem One  Active  [facility] Long Term Goal   Over the next 45 days the patient will be able to verbalize knowledge  and demonstrate home management interventions for CHF  [facility] Long Term Goal Start Date  12/07/16  [facility] Long Term Goal Met Date  12/21/16  Interventions for Problem One  Long Term Goal  Assess and discuss CHF diagnosis, sympotoms/zone symptoms to report to doctor, diet, activity, medications , weights Provided  written materials, monitor for symptoms like sob weight gain & edema  [facility] CM Short Term Goal #1   over the next 7 days the patient will take ordered diuretics to decrease edema  [facility] CM Short Term Goal #1 Start Date  12/07/16  [facility] CM Short Term Goal #1 Met Date  12/21/16  Interventions for Short Term Goal #1  Assess zone CHF symptoms, examined feet, lungs, medications, called pcp with noted CHF symptoms, Order for increase in lasix obtained Educated pt on zone symptoms, medications (how to take new ordered medications) and when to call pcp Provieded written CHF materials   [facility] CM Short Term Goal #2   Over the next 14-21 days the patient will be able to knowledge about CHF home treatment to include verbalizing zone symptoms she will call into her doctor, daily weights, diet, activity and understanding of use of CHF medications  [facility] CM Short Term Goal #2 Start Date  12/07/16  [facility] CM Short Term Goal #2 Met Date  12/21/16  Interventions for Short Term Goal #2  assess and education on CHF home treatment using CHF booklet/handouts/Use teach back method to evaluate knowledge of CHF home care needs     [facility] CM Care Plan Problem Two     Most Recent Value  Care Plan Problem Two   Possible side effect of Lisinopril or CHF yellow zone coughing symptom assistance needed plus  increase cost in medications reported (referral to [facility] pharmacist)   Role Documenting the Problem Two  Care Management [place] for Problem Two  Active  Interventions for Problem Two Long Term Goal   Discussed lisinopril side effect with pcp, NP and patient, [facility] pharmacy referral after permission given  [facility] Long Term Goal  over the next 30 - 45 days patient will be assisted with lisinopril and her medication costs  [facility] Long Term Goal Start Date  12/07/16  [facility] Long Term Goal Met Date   01/05/17  [facility] CM Short Term Goal #1   Over the next 30 days the patient will receive [facility] pharmacy referral for asisstance with Lisinopril use and possible assistance with costs of medications  [facility] CM Short Term Goal #1 Start Date  12/07/16  [facility] CM Short Term Goal #1 Met Date   02/07/17  Interventions for Short Term  Goal #2   [facility] pharmacy referral Discussed CHF symptoms related to medicines    [facility] CM Care Plan Problem Three     Most Recent Value  Care Plan Problem Three  Knowledge of gout flare up management   Role Documenting the Problem Three  Care Management Coordinator  Care Plan for Problem Three  Active  [facility] Long Term Goal   over the next 31 days gout flare up will be resolved  [facility]N Long Term Goal Start Date  12/21/16  [facility] Long Term Goal Met Date  01/05/17  Interventions for Problem Three Long Term Goal  review gout symptoms, encourage call to pcp, assessed painful gout site Provided written symptoms to monitor for   [facility] CM Short Term Goal #1   over the next 14 day patient will make contact with pcp to request treatment plan for gout management/resolution  [facility] CM Short Term Goal #1 Start Date  12/21/16  [facility] CM Short Term Goal #1 Met Date  01/05/17  Interventions for Short Term Goal #1  review gout symptoms, encourage call to pcp, assessed painful gout site Provided written symptoms to monitor for       Mirella Gueye L. Lavina Hamman, RN, BSN, [place] Care Management [phone]

## 2017-03-15 NOTE — Telephone Encounter (Signed)
Needs to have REquip called in to pharmacy. Was told it would be called in. Please let patient know when she can pick it up.

## 2017-03-17 ENCOUNTER — Ambulatory Visit (INDEPENDENT_AMBULATORY_CARE_PROVIDER_SITE_OTHER): Payer: Medicare Other | Admitting: Nurse Practitioner

## 2017-03-17 ENCOUNTER — Encounter: Payer: Self-pay | Admitting: Nurse Practitioner

## 2017-03-17 VITALS — BP 101/62 | HR 62 | Temp 99.1°F | Ht 68.0 in | Wt 156.4 lb

## 2017-03-17 DIAGNOSIS — F5089 Other specified eating disorder: Secondary | ICD-10-CM

## 2017-03-17 DIAGNOSIS — D649 Anemia, unspecified: Secondary | ICD-10-CM

## 2017-03-17 DIAGNOSIS — I5032 Chronic diastolic (congestive) heart failure: Secondary | ICD-10-CM

## 2017-03-17 DIAGNOSIS — I10 Essential (primary) hypertension: Secondary | ICD-10-CM

## 2017-03-17 NOTE — Progress Notes (Signed)
Subjective:    [name], female    DOB: 12-Sep-1929, 83 y.o.   MRN: [id number]  Kristin [name] is a 83 y.o. female presenting on 03/17/2017 for Hypertension and Urinary Frequency   HPI Hypertension She is checking BP at home.   BP at home 117/78 2 days ago Current medications: Furosemide 40 mg once daily and lisinopril 10 mg, tolerating well without side effects Pt denies headache, lightheadedness, dizziness, changes in vision, chest tightness/pressure, palpitations, leg swelling, sudden loss of speech or loss of consciousness. Daily weight [phone] over last 3 days and is typical range of weights. Drinks 8 12-oz glasses per day Pt notes pica - desires to chew ice.  Urinary Frequency Pt notes urinary frequency.  Denies burning, incomplete emptying, low back pain, flank pain, or pelvic pain/pressure.  Pt notes it correlates to furosemide administration.  COPD Only uses albuterol occasionally about 3 days per week once daily. No regular shourtness of breath.  Social History  Substance Use Topics  . Smoking status: Former Smoker    Packs/day: 1.25    Years: 40.00    Types: Cigarettes    Quit date: 12/16/1998  . Smokeless tobacco: Never Used  . Alcohol use No    Review of Systems Per HPI unless specifically indicated above     Objective:    BP 101/62 (BP Location: Right Arm, Patient Position: Sitting, Cuff Size: Normal)   Pulse 62   Temp 99.1 F (37.3 C) (Oral)   Ht 5\' 8"  (1.727 m)   Wt 156 lb 6.4 oz (70.9 kg)   SpO2 100%   BMI 23.78 kg/m   Wt Readings from Last 3 Encounters:  03/17/17 156 lb 6.4 oz (70.9 kg)  03/07/17 158 lb (71.7 kg)  03/01/17 159 lb (72.1 kg)    Physical Exam  General - healthy, well-appearing, NAD HEENT - Normocephalic, atraumatic Neck - supple, non-tender, no LAD, no thyromegaly, no carotid bruit Heart - RRR, no murmurs heard Lungs - Clear throughout all lobes, no wheezing, crackles, or rhonchi. Normal work of  breathing. Extremeties - non-tender, trace pedal edema, cap refill < 2 seconds, peripheral pulses intact +2 bilaterally Skin - warm, dry Neuro - awake, alert, oriented x3, normal gait (pt now ambulates without cane) Psych - Normal mood and affect, normal behavior    Results for orders placed or performed in visit on 03/01/17  ToxASSURE Select 13 (MW), Urine  Result Value Ref Range   Summary FINAL       Assessment & Plan:   Problem List Items Addressed This Visit      Cardiovascular and Mediastinum   Benign essential HTN    Controlled and at goal today.  Pt taking lisinopril and furosemide.  History of gout - avoid HCTZ.  Plan: 1. Continue taking medicines without changes 2. Obtain labs at next visit.  Provided pt a prescription to have drawn at another clinic since she had blood draw last week and requested no labs today. 3. Encouraged heart health diet and continued effort to increase exercise. 4. Check BP 1-2 x per week at home, keep log, and bring to clinic at next appointment. 5. Follow up 6 months.        Chronic diastolic CHF (congestive heart failure) (HCC)    Stable.  Pt w/ stable weights, trace edema, and no shortness of breath not attributed to COPD.  Plan: 1. Continue follow up with Cardiology. 2. No changes today. 3. Follow up 6 months.  Other Visit Diagnoses    Pica    -  Primary Pt notes desire to chew and eat ice.  No history of iron deficiency anemia.  Plan: 1. Check CBC and iron panel.   Relevant Orders   CBC with Differential/Platelet   Fe+TIBC+Fer   Anemia, unspecified type     Pt w/ anemia at last CBC.  See Pica plan above.   Relevant Orders   CBC with Differential/Platelet   Fe+TIBC+Fer        Follow up plan: Return in about 6 months (around 09/17/2017) for Blood pressure, COPD, iron or sooner if needed.  [name], DNP, AGPCNP-BC Adult Gerontology Primary Care Nurse Practitioner [facility] 03/22/2017, 8:05 AM

## 2017-03-17 NOTE — Patient Instructions (Addendum)
Kristin [name], Thank you for coming in to clinic today.  1. For your blood pressure: - You have a normal blood pressure.  Continue on your current blood pressure and heart failure medications.  - Take furosemide 40 mg daily - Take your lisinopril 10 mg daily.  - Blood pressure is too low if it is less than 95/60 - Blood pressure is too high if it is I higher than 130/80.   2. You like to eat ice: This is not a problem, but does mean you could have iron deficiency anemia.  - Please have your next lab draw for CBC, ferritin, TIBC, serum iron.  - Your new iron medication instructions: Take 3 (three) tablets of 325 mg ferrous sulfate every other day 30 minutes before breakfast and with orange juice or your vitamin C tablet.    If it is hard to take your pills every other day (Monday, Wednesday, Friday, Sunday, Tuesday, Thursday), then take them Monday, Wednesday, and Friday.    3. It is safe to take tylenol.  Take 1-2 tablets regular 325 mg Tylenol up to 4 times daily for your pain.  - 3,000 mg in 24 hours is your maximum dose from all medicine sources.  Please schedule a follow-up appointment with [name], AGNP. Return in about 6 months (around 09/17/2017) for Blood pressure, COPD, iron or sooner if needed.  If you have any other questions or concerns, please feel free to call the clinic or send a message through [place]. You may also schedule an earlier appointment if necessary.  You will receive a survey after today's visit either digitally by e-mail or paper by [employer]. Your experiences and feedback matter to [name].  Please respond so we know how we are doing as we provide care for you.   [name], DNP, AGNP-BC Adult Gerontology Nurse Practitioner [place]

## 2017-03-18 NOTE — Telephone Encounter (Signed)
Called and spoke with patient to reschedule missed AWV. Rescheduled for 8/28 at 10:45. Pt confirmed

## 2017-03-22 NOTE — Assessment & Plan Note (Signed)
Stable.  Pt w/ stable weights, trace edema, and no shortness of breath not attributed to COPD.  Plan: 1. Continue follow up with Cardiology. 2. No changes today. 3. Follow up 6 months.

## 2017-03-22 NOTE — Progress Notes (Signed)
I have reviewed this encounter including the documentation in this note and/or discussed this patient with the provider, [name], AGPCNP-BC. I am certifying that I agree with the content of this note as supervising physician.  [name], [facility] 03/22/2017, 8:09 AM

## 2017-03-22 NOTE — Assessment & Plan Note (Addendum)
Controlled and at goal today.  Pt taking lisinopril and furosemide.  History of gout - avoid HCTZ.  Plan: 1. Continue taking medicines without changes 2. Obtain labs at next visit.  Provided pt a prescription to have drawn at another clinic since she had blood draw last week and requested no labs today. 3. Encouraged heart health diet and continued effort to increase exercise. 4. Check BP 1-2 x per week at home, keep log, and bring to clinic at next appointment. 5. Follow up 6 months.

## 2017-04-04 ENCOUNTER — Ambulatory Visit: Payer: Medicare Other | Admitting: Pain Medicine

## 2017-04-05 NOTE — Patient Outreach (Signed)
[facility]) Care Management   02/08/2017  Kristin [name] 05/27/1930 [id number]  Kristin [name] is an 83 y.o. female PMH of Chronic diastolic heart failure, COPD, HTN, Chronic kidney disease (CKD), stage III (moderate), s/p pacemaker, CAFL (chronic airflow limitation),Complete rotator cuff rupture of shoulder (Left); DDD (degenerative disc disease), lumbosacral; Gout; H/O cardiac catheterization; Arrhythmia, sinus node (HCC,Lumbar canal stenosis; Urinary frequency/UTI,Seasonal allergies; Breast cancer ofright breast, Chronic pain syndrome; Chronic low back pain, Restless leg syndrome, Neurogenic pain; Long term current use of opiate analgesic; Chronic Opioid-induced constipation, Vitamin D insufficiency, 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 S/p laparoscopic cholecystectomy on 11/20/2016 and ERCP for stone removal, biliary sphincterotomy after an admission on 11/16/2016 to [facility] with acute cholecystitis complicated by choledocholithiasis and E.coli bacteremia. When she was discharged on 11/22/16, her home lasix and lisinopril were held due to mild AKI and hypotension during hospitalization.   Kristin [name] was referred to [facility] on 12/01/16 for complex case management (for recent hospital discharge, post surgery, hx heart failure-pacemaker; having hypotension that requires managing medication; patient is falls risk) after contacted by [facility] telephonic CM, L Manning. Kristin [name] states hergrand daughter manages her medications and takes her blood pressure twice daily and helps her with personal care. States son &grand daughter assist with meals &take her to MD appointments.   She has had a CVfollow up appointment with Kristin [name], her [facility] surgeon and CHF clinic  Subjective:   Objective:   BP 110/70   Pulse 69    Temp (!) 97.4 F (36.3 C) (Oral)   Resp 20   SpO2 95%  Review of Systems  HENT: Positive for hearing loss.   Eyes: Negative.   Respiratory: Negative.   Cardiovascular: Positive for leg swelling.  Gastrointestinal: Negative.   Genitourinary: Negative.   Musculoskeletal: Positive for back pain and joint pain.  Skin: Negative.   Neurological: Positive for weakness. Negative for dizziness, tingling, tremors, sensory change, speech change, focal weakness, seizures, loss of consciousness and headaches.  Endo/Heme/Allergies: Negative.   Psychiatric/Behavioral: Negative.     Physical Exam  Constitutional: She is oriented to person, place, and time. She appears well-developed and well-nourished.  HENT:  Head: Normocephalic and atraumatic.  Right Ear: External ear normal.  Left Ear: External ear normal.  Eyes: Pupils are equal, round, and reactive to light.  Neck: Normal range of motion.  Cardiovascular: Normal rate and normal heart sounds.   Respiratory: Effort normal.  GI: Soft. Bowel sounds are normal.  Musculoskeletal: She exhibits edema.  Neurological: She is alert and oriented to person, place, and time.  Skin: Skin is warm and dry.  Psychiatric: She has a normal mood and affect. Her behavior is normal. Judgment and thought content normal.    Encounter Medications:   Outpatient Encounter Prescriptions as of 02/08/2017  Medication Sig Note  . albuterol (PROVENTIL) (2.5 MG/3ML) 0.083% nebulizer solution Take 3 mLs (2.5 mg total) by nebulization every 8 (eight) hours as needed. Shortness of breath and wheezing   . apixaban (ELIQUIS) 5 MG TABS tablet Take 1 tablet (5 mg total) by mouth 2 (two) times daily.   . bisacodyl (DULCOLAX) 5 MG EC tablet Take 2 tablets (10 mg total) by mouth daily as needed for moderate constipation.   . Cholecalciferol (VITAMIN D) 2000 units tablet Take 2,000 Units by mouth daily.    Marland Kitchen levocetirizine (XYZAL)  5 MG tablet Take 1 tablet (5 mg total) by mouth  every evening.   Marland Kitchen lisinopril (PRINIVIL,ZESTRIL) 10 MG tablet TAKE 1 TABLET BY MOUTH ONCE DAILY.   Marland Kitchen Oxycodone HCl 20 MG TABS Take 1 tablet (20 mg total) by mouth every 8 (eight) hours as needed. 11/11/2016: DO NOT DELETE, even if Expired!!! See Kristin. Adalberto Cole care coordination note.  . OXYGEN Inhale 2 L into the lungs at bedtime.    . polyethylene glycol (MIRALAX / GLYCOLAX) packet Take 17 g by mouth daily as needed.    No facility-administered encounter medications on file as of 02/08/2017.     Functional Status:   In your present state of health, do you have any difficulty performing the following activities: 03/15/2017 01/24/2017  Hearing? - Y  Vision? N N  Difficulty concentrating or making decisions? N Y  Walking or climbing stairs? Y Y  Dressing or bathing? N N  Doing errands, shopping? N N  Preparing Food and eating ? N -  Using the Toilet? N -  In the past six months, have you accidently leaked urine? N -  Do you have problems with loss of bowel control? N -  Managing your Medications? N -  Managing your Finances? N -  Housekeeping or managing your Housekeeping? N -  Some recent data might be hidden    Fall/Depression Screening:    Fall Risk  03/07/2017 03/01/2017 02/08/2017  Falls in the past year? No No No  Risk for fall due to : - - -   PHQ 2/9 Scores 03/15/2017 03/07/2017 03/01/2017 02/08/2017 01/24/2017 01/24/2017 12/24/2016  PHQ - 2 Score 0 0 0 0 0 0 0  Exception Documentation - - - - - - -    Assessment:   THN CM followed up with Kristin [name] after collaboration with [facility] pharmacy [name] reported Eliquis is available to her for 6 months and she will not need assist until the end of the year from her provider's office  She discussed her bp is low but continues to take her BP medicines Reminded her to monitor for dizziness/hypotension symptoms to report to pcp Her feet are better Assisted to contact [place] for a catalog and to order an new BP cuff to be shipped to her  home this week.  Her previous order had been cancelled when the extra fee not paid related to first medical computer issues. Kristin Rody is aware of her credits and how to use her catalog quarterly to order other items  Continues to have chronic back pain but continues to attend pain management sessions - [facility] CM recommended a back massager, massage therapy sessions Reports she will have an evaluation on 02/11/17 by an united health care nurse visit Kristin Burtch prefers not to call the audiologist to get an appointment until she has the funds to pay for hearing aid [facility] was re contacted about her bill Confirms she has a 20 % portion to pay and was able to get payment plan  Plan:  Will be contacted on monthly via telephone as agreed by Kristin Berenice Primas in 1-2 weeks   [facility] CM Care Plan Problem One     Most Recent Value  Care Plan Problem One  Knowledge deficit of congestive heart failure home management (signs/symptoms, treatment interventions  Role Documenting the Problem One  Care Management [place] for Problem One  Active  THN Long Term Goal   Over the next 45 days the  patient will be able to verbalize knowledge  and demonstrate home management interventions for CHF  THN Long Term Goal Start Date  12/07/16  [facility] Long Term Goal Met Date  12/21/16  Interventions for Problem One Long Term Goal  Assess and discuss CHF diagnosis, sympotoms/zone symptoms to report to doctor, diet, activity, medications , weights Provided  written materials, monitor for symptoms like sob weight gain & edema  THN CM Short Term Goal #1   over the next 7 days the patient will take ordered diuretics to decrease edema  THN CM Short Term Goal #1 Start Date  12/07/16  [facility] CM Short Term Goal #1 Met Date  12/21/16  Interventions for Short Term Goal #1  Assess zone CHF symptoms, examined feet, lungs, medications, called pcp with noted CHF symptoms, Order for increase in lasix obtained Educated pt on zone symptoms, medications  (how to take new ordered medications) and when to call pcp Provieded written CHF materials   THN CM Short Term Goal #2   Over the next 14-21 days the patient will be able to knowledge about CHF home treatment to include verbalizing zone symptoms she will call into her doctor, daily weights, diet, activity and understanding of use of CHF medications  THN CM Short Term Goal #2 Start Date  12/07/16  [facility] CM Short Term Goal #2 Met Date  12/21/16  Interventions for Short Term Goal #2  assess and education on CHF home treatment using CHF booklet/handouts/Use teach back method to evaluate knowledge of CHF home care needs     [facility] CM Care Plan Problem Two     Most Recent Value  Care Plan Problem Two   Possible side effect of Lisinopril or CHF yellow zone coughing symptom assistance needed plus  increase cost in medications reported (referral to [facility] pharmacist)   Role Documenting the Problem Two  Care Management [place] for Problem Two  Active  Interventions for Problem Two Long Term Goal   Discussed lisinopril side effect with pcp, NP and patient, [facility] pharmacy referral after permission given  THN Long Term Goal  over the next 30 - 45 days patient will be assisted with lisinopril and her medication costs  THN Long Term Goal Start Date  12/07/16  THN CM Short Term Goal #1   Over the next 30 days the patient will receive [facility] pharmacy referral for asisstance with Lisinopril use and possible assistance with costs of medications  THN CM Short Term Goal #1 Start Date  12/07/16  Interventions for Short Term Goal #2   [facility] pharmacy referral Discussed CHF symptoms related to medicines    [facility] CM Care Plan Problem Three     Most Recent Value  Care Plan Problem Three  Knowledge of gout flare up management   Role Documenting the Problem Three  Care Management [place] for Problem Three  Active  THN Long Term Goal   over the next 31 days gout flare up will be resolved  THN Long Term Goal  Start Date  12/21/16  [facility] Long Term Goal Met Date  01/05/17  Interventions for Problem Three Long Term Goal  review gout symptoms, encourage call to pcp, assessed painful gout site Provided written symptoms to monitor for   [facility] CM Short Term Goal #1   over the next 14 day patient will make contact with pcp to request treatment plan for gout management/resolution  [facility] CM Short Term Goal #1 Start Date  12/21/16  THN CM Short Term Goal #1 Met Date  01/05/17  Interventions for Short Term Goal #1  review gout symptoms, encourage call to pcp, assessed painful gout site Provided written symptoms to monitor for      Kimberly L. Lavina Hamman, RN, BSN, [place] Care Management [phone]

## 2017-04-08 ENCOUNTER — Other Ambulatory Visit: Payer: Self-pay | Admitting: Pain Medicine

## 2017-04-08 DIAGNOSIS — G2581 Restless legs syndrome: Secondary | ICD-10-CM

## 2017-04-11 ENCOUNTER — Encounter: Payer: Self-pay | Admitting: Nurse Practitioner

## 2017-04-11 ENCOUNTER — Ambulatory Visit: Payer: Medicare Other | Admitting: Pain Medicine

## 2017-04-12 ENCOUNTER — Other Ambulatory Visit: Payer: Self-pay | Admitting: *Deleted

## 2017-04-12 ENCOUNTER — Other Ambulatory Visit: Payer: Self-pay | Admitting: Nurse Practitioner

## 2017-04-12 MED ORDER — FUROSEMIDE 20 MG PO TABS: 20.0000 mg | ORAL_TABLET | Freq: Every day | ORAL | 1 refills | Status: DC

## 2017-04-12 NOTE — Patient Outreach (Signed)
[facility]) Care Management  04/12/2017  BABITA AMAKER [birth year] [id number]   Telephonic assessment attempt Call to Ms Ariola. No answer Left a voice message  [facility] CM left her work mobile contact number in a HIPPA compliant voice message and requested a return call from Mrs Chakraborty for follow up If no return call [facility] CM will attempt to contact Ms Knouff again this week  Joelene Millin L. Lavina Hamman, RN, BSN, [place] Care Management [phone]

## 2017-04-13 NOTE — Patient Outreach (Signed)
[facility]) Care Management  [place]  04/13/2017   AZA DANTES 11/30/1929 [id number]   LYRICK WORLAND is an 83 y.o. female PMH of Chronic diastolic heart failure, COPD, HTN, Chronic kidney disease (CKD), stage III (moderate), s/p pacemaker, CAFL (chronic airflow limitation),Complete rotator cuff rupture of shoulder (Left); DDD (degenerative disc disease), lumbosacral; Gout; H/O cardiac catheterization; Arrhythmia, sinus node (HCC,Lumbar canal stenosis; Urinary frequency/UTI,Seasonal allergies; Breast cancer ofright breast, Chronic pain syndrome; Chronic low back pain, Restless leg syndrome, Neurogenic pain; Long term current use of opiate analgesic; Chronic Opioid-induced constipation, Vitamin D insufficiency, 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 S/p laparoscopic cholecystectomy on 11/20/2016 and ERCP for stone removal, biliary sphincterotomy after an admission on 11/16/2016 to [facility] with acute cholecystitis complicated by choledocholithiasis and E.coli bacteremia. When she was discharged on 11/22/16, her home lasix and lisinopril were held due to mild AKI and hypotension during hospitalization.   Ms Sterbenz was referred to [place] on 12/01/16 for complex case management (for recent hospital discharge, post surgery, hx heart failure-pacemaker; having hypotension that requires managing medication; patient is falls risk) after contacted by [facility] telephonic CM, L Manning. Ms Dack  Is assisted and supported by her son and grand daughter  Subjective: "I am doing well" Objective:   Encounter Medications:  Outpatient Encounter Prescriptions as of 04/12/2017  Medication Sig Note  . albuterol (PROVENTIL) (2.5 MG/3ML) 0.083% nebulizer solution Take 3 mLs (2.5 mg total) by nebulization every 8 (eight) hours  as needed. Shortness of breath and wheezing   . apixaban (ELIQUIS) 5 MG TABS tablet Take 1 tablet (5 mg total) by mouth 2 (two) times daily.   . bisacodyl (DULCOLAX) 5 MG EC tablet Take 2 tablets (10 mg total) by mouth daily as needed for moderate constipation.   . Cholecalciferol (VITAMIN D) 2000 units tablet Take 2,000 Units by mouth daily.    Marland Kitchen levocetirizine (XYZAL) 5 MG tablet Take 1 tablet (5 mg total) by mouth every evening.   Marland Kitchen lisinopril (PRINIVIL,ZESTRIL) 10 MG tablet TAKE 1 TABLET BY MOUTH ONCE DAILY.   Marland Kitchen Oxycodone HCl 20 MG TABS Take 1 tablet (20 mg total) by mouth every 8 (eight) hours as needed. 11/11/2016: DO NOT DELETE, even if Expired!!! See Dr. Adalberto Cole care coordination note.  . Oxycodone HCl 20 MG TABS Take 1 tablet (20 mg total) by mouth every 8 (eight) hours as needed.   . Oxycodone HCl 20 MG TABS Take 1 tablet (20 mg total) by mouth every 8 (eight) hours as needed. (Patient not taking: Reported on 03/17/2017)   . OXYGEN Inhale 2 L into the lungs at bedtime.    . polyethylene glycol (MIRALAX / GLYCOLAX) packet Take 17 g by mouth daily as needed.   Marland Kitchen rOPINIRole (REQUIP) 2 MG tablet Take 1 tablet (2 mg total) by mouth at bedtime.   . [DISCONTINUED] furosemide (LASIX) 40 MG tablet Take 1 tablet (40 mg total) by mouth daily.    No facility-administered encounter medications on file as of 04/12/2017.     Functional Status:  In your present state of health, do you have any difficulty performing the following activities: 03/15/2017 01/24/2017  Hearing? - Y  Vision? N N  Difficulty concentrating or making decisions? N Y  Walking or climbing stairs? Y Y  Dressing or bathing? N N  Doing errands, shopping? N N  Preparing  Food and eating ? N -  Using the Toilet? N -  In the past six months, have you accidently leaked urine? N -  Do you have problems with loss of bowel control? N -  Managing your Medications? N -  Managing your Finances? N -  Housekeeping or managing your  Housekeeping? N -  Some recent data might be hidden    Fall/Depression Screening: Fall Risk  03/07/2017 03/01/2017 02/08/2017  Falls in the past year? No No No  Risk for fall due to : - - -   PHQ 2/9 Scores 03/15/2017 03/07/2017 03/01/2017 02/08/2017 01/24/2017 01/24/2017 12/24/2016  PHQ - 2 Score 0 0 0 0 0 0 0  Exception Documentation - - - - - - -    Assessment:   [facility] CM contacted Ms Fraiser for a telephonic assessment and follow up call.  She had done well with meeting her [facility] goals and has not been hospitalized since [facility] CM has been following her  On today Ms Witting continues to voice concern about her hand and back pain in which she is being followed by pain management providers.  She is scheduled to see pain management providers "next week"   She discussed cramps and foot pain with a PMH of gout but Ms Swaminathan pain now related to a bunion on her left foot. Per Ms Jiles Dr Elvina Mattes assisted her with a bunion on her right foot and she is waiting to see how the provide wants to proceed with interventions for her left foot She confirms she has good shoes or diabetic shoes when [facility] CM discusses this option with her.  [facility] CM reviewed a list of potassium rich foods with Ms Fedorchak to assist with cramps and encouraged her to speak with her providers about her potassium levels and interventions    Hearing Ms Westra states she received a call from a hearing aid company.  Ms Feely voiced interested in finding out if the company is okay to use and asked [facility] Cm to talk with [name], grand daughter about this. The company is called [employer] (ask for bill) [phone] THN CM discussed with her again that she would want to use a company in network with her insurance coverage to prevent from paying a large out of pocket expense.  THN CM and Ms Balik have spent time previously reviewing this process and made contact with her insurance to get in network providers.  This information was written for her in  her [facility] contact section of her calendar   Hypotension Ms Braaten reports a low BP "way down" and she call her pcp who "cut lasix" from 40 to 20 mg  She reports examples of bps as : 105/72, 111/73, 101/58, 88/52, 74/52, 87/53   [facility] CM inquired and discussed monitoring for dizziness and hold BP medications when below 90/60 to prevent dizziness and falls [facility]N Cm recommend she share these numbers also with her pcp   Weight is at 154 lbs now.  Ms Carmer reports that she generally does not eat lunch but prefers to eat breakfast & dinner. Ms Spindler reports a previous weight of 258 lbs THN CM calculated Ms Cass present BMI and shared with her the BMI and what the recommended weight for a normal BMI for her    Plan:  [facility] CM will contact Ms Lamia in 4 weeks to follow up on her progress THN Cm will assist to check for in network status of her  interest in hearing aid company and update her grand daughter [facility] will route note to pcp and other providers listed on care team in [place] Problem One     Most Recent Value  Care Plan Problem One  (P) Knowledge deficit of congestive heart failure home management (signs/symptoms, treatment interventions  Role Documenting the Problem One  (P) Care Management [place] for Problem One  (P) Active  THN Long Term Goal   (P) Over the next 45 days the patient will be able to verbalize knowledge  and demonstrate home management interventions for CHF  THN Long Term Goal Start Date  (P) 12/07/16  THN Long Term Goal Met Date  (P) 12/21/16  Interventions for Problem One Long Term Goal  (P) Assess and discuss CHF diagnosis, sympotoms/zone symptoms to report to doctor, diet, activity, medications , weights Provided  written materials, monitor for symptoms like sob weight gain & edema  THN CM Short Term Goal #1   (P) over the next 7 days the patient will take ordered diuretics to decrease edema  THN CM Short Term Goal #1 Start Date  (P) 12/07/16   THN CM Short Term Goal #1 Met Date  (P) 12/21/16  Interventions for Short Term Goal #1  (P) Assess zone CHF symptoms, examined feet, lungs, medications, called pcp with noted CHF symptoms, Order for increase in lasix obtained Educated pt on zone symptoms, medications (how to take new ordered medications) and when to call pcp Provieded written CHF materials   THN CM Short Term Goal #2   (P) Over the next 14-21 days the patient will be able to knowledge about CHF home treatment to include verbalizing zone symptoms she will call into her doctor, daily weights, diet, activity and understanding of use of CHF medications  THN CM Short Term Goal #2 Start Date  (P) 12/07/16  THN CM Short Term Goal #2 Met Date  (P) 12/21/16  Interventions for Short Term Goal #2  (P) assess and education on CHF home treatment using CHF booklet/handouts/Use teach back method to evaluate knowledge of CHF home care needs     [facility] CM Care Plan Problem Two     Most Recent Value  Care Plan Problem Two  (P)  Possible side effect of Lisinopril or CHF yellow zone coughing symptom assistance needed plus  increase cost in medications reported (referral to [facility] pharmacist)   Role Documenting the Problem Two  (P) [place] for Problem Two  (P) Active  Interventions for Problem Two Long Term Goal   (P) Discussed lisinopril side effect with pcp, NP and patient, [facility] pharmacy referral after permission given  THN Long Term Goal  (P) over the next 30 - 45 days patient will be assisted with lisinopril and her medication costs  THN Long Term Goal Start Date  (P) 12/07/16  THN Long Term Goal Met Date  (P) 01/05/17  THN CM Short Term Goal #1   (P) Over the next 30 days the patient will receive [facility] pharmacy referral for asisstance with Lisinopril use and possible assistance with costs of medications  THN CM Short Term Goal #1 Start Date  (P) 12/07/16  THN CM Short Term Goal #1 Met Date   (P) 02/07/17  Interventions for  Short Term Goal #2   (P) [facility] pharmacy referral Discussed CHF symptoms related to medicines    [facility] CM Care Plan Problem Three  Most Recent Value  Care Plan Problem Three  (P) Knowledge of gout flare up management   Role Documenting the Problem Three  (P) Care Management Coordinator  Care Plan for Problem Three  (P) Active  THN Long Term Goal   (P) over the next 31 days gout flare up will be resolved  THN Long Term Goal Start Date  (P) 12/21/16  THN Long Term Goal Met Date  (P) 01/05/17  Interventions for Problem Three Long Term Goal  (P) review gout symptoms, encourage call to pcp, assessed painful gout site Provided written symptoms to monitor for   [facility] CM Short Term Goal #1   (P) over the next 14 day patient will make contact with pcp to request treatment plan for gout management/resolution  [facility] CM Short Term Goal #1 Start Date  (P) 12/21/16  THN CM Short Term Goal #1 Met Date  (P) 01/05/17  Interventions for Short Term Goal #1  (P) review gout symptoms, encourage call to pcp, assessed painful gout site Provided written symptoms to monitor for      Kimberly L. Lavina Hamman, RN, BSN, [place] Care Management [phone]

## 2017-04-19 ENCOUNTER — Ambulatory Visit (INDEPENDENT_AMBULATORY_CARE_PROVIDER_SITE_OTHER): Payer: Medicare Other

## 2017-04-19 VITALS — BP 108/62 | HR 78 | Temp 98.5°F | Resp 17 | Ht 68.0 in | Wt 157.2 lb

## 2017-04-19 DIAGNOSIS — Z0000 Encounter for general adult medical examination without abnormal findings: Secondary | ICD-10-CM

## 2017-04-19 DIAGNOSIS — Z23 Encounter for immunization: Secondary | ICD-10-CM | POA: Diagnosis not present

## 2017-04-19 NOTE — Progress Notes (Signed)
Subjective:   Kristin [name] is a 83 y.o. female who presents for Medicare Annual (Subsequent) preventive examination.  Review of Systems:  Cardiac Risk Factors include: advanced age (>45men, >65 women);hypertension;dyslipidemia     Objective:     Vitals: BP 108/62 (BP Location: Right Arm, Patient Position: Sitting)   Pulse 78   Temp 98.5 F (36.9 C)   Resp 17   Ht 5\' 8"  (1.727 m)   Wt 157 lb 3.2 oz (71.3 kg)   BMI 23.90 kg/m   Body mass index is 23.9 kg/m.   Tobacco History  Smoking Status  . Former Smoker  . Packs/day: 1.25  . Years: 40.00  . Types: Cigarettes  . Quit date: 12/16/1998  Smokeless Tobacco  . Never Used     Counseling given: Not Answered   Past Medical History:  Diagnosis Date  . Arthritis   . Arthritis   . Atrial fibrillation ([place])   . Back pain    lower back chronic  . Bradycardia   . Breast cancer ([place]) 2004   right breast cancer  . Cancer [facility]) 2004   rt breast cancer-post lumpectomy- chemo/rad  . CHF (congestive heart failure) ([place])   . Chronic back pain   . CKD (chronic kidney disease)   . COPD (chronic obstructive pulmonary disease) (HCC)    wears O2 at 2L via [place] at night  . Cystocele   . Decubitus ulcers   . Gout   . Hematuria   . Hepatitis C   . Hyperlipidemia   . Hypertension   . Hypomagnesemia 06/25/2015  . OAB (overactive bladder)   . Overactive bladder   . Restless leg   . Shoulder pain, bilateral   . Urinary frequency   . UTI (lower urinary tract infection)   . Vaginal atrophy    Past Surgical History:  Procedure Laterality Date  . ABDOMINAL HYSTERECTOMY    . BACK SURGERY    . BREAST SURGERY     rt lumpectomy  . CARPAL TUNNEL RELEASE    . CATARACT EXTRACTION W/PHACO  10/19/2011   Procedure: CATARACT EXTRACTION PHACO AND INTRAOCULAR LENS PLACEMENT (IOC);  Surgeon: Elta Guadeloupe T. Gershon Crane, MD;  Location: [facility];  Service: Ophthalmology;  Laterality: Right;  CDE:10.81  . CATARACT EXTRACTION W/PHACO  11/02/2011   Procedure: CATARACT EXTRACTION PHACO AND INTRAOCULAR LENS PLACEMENT (IOC);  Surgeon: Elta Guadeloupe T. Gershon Crane, MD;  Location: [facility];  Service: Ophthalmology;  Laterality: Left;  CDE 8.60  . CHOLECYSTECTOMY     3/18  . COLON SURGERY    . JOINT REPLACEMENT     bilateral TKA  . PACEMAKER INSERTION N/A 12/24/2015   Procedure: INSERTION PACEMAKER;  Surgeon: Isaias Cowman, MD;  Location: [facility] ORS;  Service: Cardiovascular;  Laterality: N/A;  . ROTATOR CUFF REPAIR     bilateral   Family History  Problem Relation Age of Onset  . Kidney disease Brother   . Heart disease Father   . Heart disease Mother   . Stroke Mother   . Cancer Sister   . Breast cancer Sister 65  . Breast cancer Paternal Aunt   . Anesthesia problems Neg Hx   . Hypotension Neg Hx   . Malignant hyperthermia Neg Hx   . Pseudochol deficiency Neg Hx    History  Sexual Activity  . Sexual activity: Yes  . Birth control/ protection: Surgical    Outpatient Encounter Prescriptions as of 04/19/2017  Medication Sig  . albuterol (PROVENTIL) (2.5 MG/3ML) 0.083% nebulizer solution  Take 3 mLs (2.5 mg total) by nebulization every 8 (eight) hours as needed. Shortness of breath and wheezing  . apixaban (ELIQUIS) 5 MG TABS tablet Take 1 tablet (5 mg total) by mouth 2 (two) times daily.  . bisacodyl (DULCOLAX) 5 MG EC tablet Take 2 tablets (10 mg total) by mouth daily as needed for moderate constipation.  . Cholecalciferol (VITAMIN D) 2000 units tablet Take 2,000 Units by mouth daily.   . furosemide (LASIX) 20 MG tablet Take 1 tablet (20 mg total) by mouth daily. (Patient taking differently: Take 20 mg by mouth daily. Take 1/2 tablet a day)  . levocetirizine (XYZAL) 5 MG tablet Take 1 tablet (5 mg total) by mouth every evening.  Marland Kitchen lisinopril (PRINIVIL,ZESTRIL) 10 MG tablet TAKE 1 TABLET BY MOUTH ONCE DAILY.  Marland Kitchen Oxycodone HCl 20 MG TABS Take 1 tablet (20 mg total) by mouth every 8 (eight) hours as needed.  . OXYGEN Inhale 2 L into the lungs at  bedtime.   . polyethylene glycol (MIRALAX / GLYCOLAX) packet Take 17 g by mouth daily as needed.  Marland Kitchen rOPINIRole (REQUIP) 2 MG tablet Take 1 tablet (2 mg total) by mouth at bedtime.  . Oxycodone HCl 20 MG TABS Take 1 tablet (20 mg total) by mouth every 8 (eight) hours as needed.  . Oxycodone HCl 20 MG TABS Take 1 tablet (20 mg total) by mouth every 8 (eight) hours as needed.   No facility-administered encounter medications on file as of 04/19/2017.     Activities of Daily Living In your present state of health, do you have any difficulty performing the following activities: 04/19/2017 03/15/2017  Hearing? [place]? Y N  Difficulty concentrating or making decisions? Y N  Walking or climbing stairs? Y Y  Dressing or bathing? N N  Doing errands, shopping? N N  Preparing Food and eating ? N N  Using the Toilet? N N  In the past six months, have you accidently leaked urine? N N  Do you have problems with loss of bowel control? N N  Managing your Medications? N N  Managing your Finances? N N  Housekeeping or managing your Housekeeping? N N  Some recent data might be hidden    Patient Care Team: Mikey College, NP as PCP - Aquilla Hacker, Mickel Crow, RN as [place], Devonne Doughty, DO as Consulting Physician (Family Medicine) Isaias Cowman, MD as Consulting Physician (Cardiology) Milinda Pointer, MD as Referring Physician (Pain Medicine) Alisa Graff, FNP as Nurse Practitioner (Family Medicine)    Assessment:     Exercise Activities and Dietary recommendations Current Exercise Habits: The patient does not participate in regular exercise at present, Exercise limited by: orthopedic condition(s) (back pain)  Goals    None     Fall Risk Fall Risk  04/19/2017 03/07/2017 03/01/2017 02/08/2017 01/24/2017  Falls in the past year? No No No No No  Risk for fall due to : - - - - -   Depression Screen PHQ 2/9 Scores 04/19/2017  03/15/2017 03/07/2017 03/01/2017  PHQ - 2 Score 0 0 0 0  Exception Documentation - - - -     Cognitive Function     6CIT Screen 04/19/2017  What Year? 0 points  What month? 0 points  What time? 0 points  Count back from 20 0 points  Months in reverse 0 points  Repeat phrase 0 points  Total Score 0    Immunization History  Administered Date(s) Administered  . Influenza, High Dose Seasonal PF 06/17/2015, 06/22/2016, 04/19/2017  . Pneumococcal Conjugate-13 07/30/2014  . Pneumococcal Polysaccharide-23 08/23/2008  . Tdap 07/30/2014   Screening Tests Health Maintenance  Topic Date Due  . INFLUENZA VACCINE  03/23/2017  . TETANUS/TDAP  07/30/2024  . DEXA SCAN  Completed  . PNA vac Low Risk Adult  Completed      Plan:     I have personally reviewed and addressed the Medicare Annual Wellness questionnaire and have noted the following in the patient's chart:  A. Medical and social history B. Use of alcohol, tobacco or illicit drugs  C. Current medications and supplements D. Functional ability and status E.  Nutritional status F.  Physical activity G. Advance directives H. List of other physicians I.  Hospitalizations, surgeries, and ER visits in previous 12 months J.  [place] such as hearing and vision if needed, cognitive and depression L. Referrals and appointments   In addition, I have reviewed and discussed with patient certain preventive protocols, quality metrics, and best practice recommendations. A written personalized care plan for preventive services as well as general preventive health recommendations were provided to patient.   Signed,  Tyler Aas, LPN Nurse Health Advisor   MD Recommendations: patient brought copy of recent recorded BP's, copy given to [place].

## 2017-04-19 NOTE — Patient Instructions (Signed)
[name] , Thank you for taking time to come for your Medicare Wellness Visit. I appreciate your ongoing commitment to your health goals. Please review the following plan we discussed and let me know if I can assist you in the future.   Screening recommendations/referrals: Colonoscopy: no longer required Mammogram: no longer required Bone Density: completed 06/17/2015 Recommended yearly ophthalmology/optometry visit for glaucoma screening and checkup Recommended yearly dental visit for hygiene and checkup  Vaccinations: Influenza vaccine: done today  Pneumococcal vaccine: up to date Tdap vaccine: up to date Shingles vaccine: due, check with your insurance company for coverage  Advanced directives: Advance directive discussed with you today. I have provided a copy for you to complete at home and have notarized. Once this is complete please bring a copy in to our office so we can scan it into your chart.  Conditions/risks identified: none  Next appointment: Follow up on 07/22/2017 at 10:40am with Lissa Merlin. Follow up in one year for your annual wellness exam.    Preventive Care 65 Years and Older, Female Preventive care refers to lifestyle choices and visits with your health care provider that can promote health and wellness. What does preventive care include?  A yearly physical exam. This is also called an annual well check.  Dental exams once or twice a year.  Routine eye exams. Ask your health care provider how often you should have your eyes checked.  Personal lifestyle choices, including:  Daily care of your teeth and gums.  Regular physical activity.  Eating a healthy diet.  Avoiding tobacco and drug use.  Limiting alcohol use.  Practicing safe sex.  Taking low-dose aspirin every day.  Taking vitamin and mineral supplements as recommended by your health care provider. What happens during an annual well check? The services and screenings done by your  health care provider during your annual well check will depend on your age, overall health, lifestyle risk factors, and family history of disease. Counseling  Your health care provider may ask you questions about your:  Alcohol use.  Tobacco use.  Drug use.  Emotional well-being.  Home and relationship well-being.  Sexual activity.  Eating habits.  History of falls.  Memory and ability to understand (cognition).  Work and work Statistician.  Reproductive health. Screening  You may have the following tests or measurements:  Height, weight, and BMI.  Blood pressure.  Lipid and cholesterol levels. These may be checked every 5 years, or more frequently if you are over 45 years old.  Skin check.  Lung cancer screening. You may have this screening every year starting at age 71 if you have a 30-pack-year history of smoking and currently smoke or have quit within the past 15 years.  Fecal occult blood test (FOBT) of the stool. You may have this test every year starting at age 50.  Flexible sigmoidoscopy or colonoscopy. You may have a sigmoidoscopy every 5 years or a colonoscopy every 10 years starting at age 18.  Hepatitis C blood test.  Hepatitis B blood test.  Sexually transmitted disease (STD) testing.  Diabetes screening. This is done by checking your blood sugar (glucose) after you have not eaten for a while (fasting). You may have this done every 1-3 years.  Bone density scan. This is done to screen for osteoporosis. You may have this done starting at age 42.  Mammogram. This may be done every 1-2 years. Talk to your health care provider about how often you should have regular mammograms. Talk  with your health care provider about your test results, treatment options, and if necessary, the need for more tests. Vaccines  Your health care provider may recommend certain vaccines, such as:  Influenza vaccine. This is recommended every year.  Tetanus, diphtheria, and  acellular pertussis (Tdap, Td) vaccine. You may need a Td booster every 10 years.  Zoster vaccine. You may need this after age 32.  Pneumococcal 13-valent conjugate (PCV13) vaccine. One dose is recommended after age 14.  Pneumococcal polysaccharide (PPSV23) vaccine. One dose is recommended after age 43. Talk to your health care provider about which screenings and vaccines you need and how often you need them. This information is not intended to replace advice given to you by your health care provider. Make sure you discuss any questions you have with your health care provider. Document Released: 09/05/2015 Document Revised: 04/28/2016 Document Reviewed: 06/10/2015 Elsevier Interactive Patient Education  2017 [place] Prevention in the Home Falls can cause injuries. They can happen to people of all ages. There are many things you can do to make your home safe and to help prevent falls. What can I do on the outside of my home?  Regularly fix the edges of walkways and driveways and fix any cracks.  Remove anything that might make you trip as you walk through a door, such as a raised step or threshold.  Trim any bushes or trees on the path to your home.  Use bright outdoor lighting.  Clear any walking paths of anything that might make someone trip, such as rocks or tools.  Regularly check to see if handrails are loose or broken. Make sure that both sides of any steps have handrails.  Any raised decks and porches should have guardrails on the edges.  Have any leaves, snow, or ice cleared regularly.  Use sand or salt on walking paths during winter.  Clean up any spills in your garage right away. This includes oil or grease spills. What can I do in the bathroom?  Use night lights.  Install grab bars by the toilet and in the tub and shower. Do not use towel bars as grab bars.  Use non-skid mats or decals in the tub or shower.  If you need to sit down in the shower, use  a plastic, non-slip stool.  Keep the floor dry. Clean up any water that spills on the floor as soon as it happens.  Remove soap buildup in the tub or shower regularly.  Attach bath mats securely with double-sided non-slip rug tape.  Do not have throw rugs and other things on the floor that can make you trip. What can I do in the bedroom?  Use night lights.  Make sure that you have a light by your bed that is easy to reach.  Do not use any sheets or blankets that are too big for your bed. They should not hang down onto the floor.  Have a firm chair that has side arms. You can use this for support while you get dressed.  Do not have throw rugs and other things on the floor that can make you trip. What can I do in the kitchen?  Clean up any spills right away.  Avoid walking on wet floors.  Keep items that you use a lot in easy-to-reach places.  If you need to reach something above you, use a strong step stool that has a grab bar.  Keep electrical cords out of the way.  Do  not use floor polish or wax that makes floors slippery. If you must use wax, use non-skid floor wax.  Do not have throw rugs and other things on the floor that can make you trip. What can I do with my stairs?  Do not leave any items on the stairs.  Make sure that there are handrails on both sides of the stairs and use them. Fix handrails that are broken or loose. Make sure that handrails are as long as the stairways.  Check any carpeting to make sure that it is firmly attached to the stairs. Fix any carpet that is loose or worn.  Avoid having throw rugs at the top or bottom of the stairs. If you do have throw rugs, attach them to the floor with carpet tape.  Make sure that you have a light switch at the top of the stairs and the bottom of the stairs. If you do not have them, ask someone to add them for you. What else can I do to help prevent falls?  Wear shoes that:  Do not have high heels.  Have  rubber bottoms.  Are comfortable and fit you well.  Are closed at the toe. Do not wear sandals.  If you use a stepladder:  Make sure that it is fully opened. Do not climb a closed stepladder.  Make sure that both sides of the stepladder are locked into place.  Ask someone to hold it for you, if possible.  Clearly mark and make sure that you can see:  Any grab bars or handrails.  First and last steps.  Where the edge of each step is.  Use tools that help you move around (mobility aids) if they are needed. These include:  Canes.  Walkers.  Scooters.  Crutches.  Turn on the lights when you go into a dark area. Replace any light bulbs as soon as they burn out.  Set up your furniture so you have a clear path. Avoid moving your furniture around.  If any of your floors are uneven, fix them.  If there are any pets around you, be aware of where they are.  Review your medicines with your doctor. Some medicines can make you feel dizzy. This can increase your chance of falling. Ask your doctor what other things that you can do to help prevent falls. This information is not intended to replace advice given to you by your health care provider. Make sure you discuss any questions you have with your health care provider. Document Released: 06/05/2009 Document Revised: 01/15/2016 Document Reviewed: 09/13/2014 Elsevier Interactive Patient Education  2017 [employer].

## 2017-04-19 NOTE — Progress Notes (Signed)
Patient's Name: Kristin [name]  MRN: [id number]  Referring Provider: Mikey College, *  DOB: 1930-02-09  PCP: Mikey College, NP  DOS: 04/20/2017  Note by: Gaspar Cola, MD  Service setting: Ambulatory outpatient  Specialty: Interventional Pain Management  Location: [facility] (AMB) Pain Management Facility    Patient type: Established   Primary Reason(s) for Visit: Encounter for post-procedure evaluation of chronic illness with mild to moderate exacerbation CC: Back Pain (lower) and Hip Pain (left)  HPI  Kristin [name] is a 83 y.o. year old, female patient, who comes today for a post-procedure evaluation. She has Chronic kidney disease (CKD), stage III (moderate); DDD (degenerative disc disease), lumbosacral; Gout; Benign essential HTN; Arrhythmia, sinus node; Lumbar canal stenosis; Chronic obstructive pulmonary disease ([place]); Chemical diabetes; History of cardiac catheterization; Seasonal allergies; History of breast cancer; Chronic pain syndrome; Chronic low back pain (Location of Primary Source of Pain) (Bilateral) (L>R); Lumbar facet syndrome (Bilateral) (L>R); Lumbar spondylosis; Chronic lumbar radicular pain (Bilateral) (L>R) (L5 Dermatome); Restless leg syndrome; Myofascial pain; Neurogenic pain; Long term current use of opiate analgesic; Long term prescription opiate use; Opiate use (90 MME/Day); Opioid-induced constipation (OIC); Vitamin D insufficiency; Grade 1 Retrolisthesis of L1 over L2; Lumbar facet hypertrophy (L1-2, L2-3, and L4-5); Stenosis of lateral recess of lumbar spine (L1-2 and L4-5); 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); Epidural fibrosis; 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); 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 Her primarily concern today is the Back Pain (lower) and Hip Pain (left)  Pain Assessment: Location: Lower Back Radiating: having left pain (not sure if pain is coming from back pain) Onset: More than a month ago Duration: Chronic pain Quality: Aching, Constant Severity: 5 /10 (self-reported pain score)  Note: Reported level is inconsistent with clinical observations. Clinically the patient looks like a 1/10 Information on the proper use of the pain scale provided to the patient today Effect on ADL: pace self Timing: Constant Modifying factors: medicine   Kristin [name] comes in today for post-procedure evaluation after the treatment done on 04/08/2017. The patient returns in today clinics today indicating that the nerve blocks did not help. However, when I asked her if she had any shoulder pain while the air was numb, she indicated that she did not. I then asked her how long it had stayed numb and she indicated that the pain had returned the next day suggesting that he had been numb in controlling the pain for over 8 hours. This would suggest that the procedure was successful in providing the patient with relief of the pain for the duration of local anesthetic, suggesting that this is a possible way to control this shoulder pain. Unfortunately it did not provide her with any long-term benefit as the primary cause of the pain is more mechanical rather than inflammatory. However, the care she did get benefit from the local anesthetic, this means that we could do a radiofrequency of the right suprascapular nerve to provide her with longer lasting benefit.  On  12/13/2016, we did a left-sided lumbar facet radiofrequency neurotomy under fluoroscopic guidance. We  never completed the radiofrequency on the right side. Today she comes in indicating that she is having pain in the lower lumbar region and upon examining her, it is clear to me that this is a woman from the sacroiliac joint and her hip joints. The patient had a positive Patrick maneuver, bilaterally, for sacroiliac joint pain, and hip joint pain. She also had significant decreased range of motion of the hip joints, especially on the left side. Today we will be ordering x-rays of these areas and we will consider the possibility of doing some diagnostic bilateral intra-articular hip joint injections and/or sacroiliac joint injections. We will also need to consider completing the radiofrequency of the lumbar facets on the right side.  Aside from the above, the patient presents today with low blood pressure but she denies any lightheadedness, orthostatic hypotension upon changing positions, or unsteady gait. However, I have ordered my nursing staff to contact the patient primary care physician to see if they can review her blood pressure medication as it may need to be adjusted.  Further details on both, my assessment(s), as well as the proposed treatment plan, please see below.  Post-Procedure Assessment  04/08/2017 Procedure: Diagnostic right-sided suprascapular nerve block under fluoroscopic guidance and IV sedation Pre-procedure pain score:  5/10 Post-procedure pain score: 0/10 (100% relief) Influential Factors: BMI: 24.02 kg/m Intra-procedural challenges: None observed.         Assessment challenges: None detected.              Reported side-effects: None.        Post-procedural adverse reactions or complications: None reported         Sedation: Sedation provided. When no sedatives are used, the analgesic levels obtained are directly associated to the effectiveness of the local anesthetics.  However, when sedation is provided, the level of analgesia obtained during the initial 1 hour following the intervention, is believed to be the result of a combination of factors. These factors may include, but are not limited to: 1. The effectiveness of the local anesthetics used. 2. The effects of the analgesic(s) and/or anxiolytic(s) used. 3. The degree of discomfort experienced by the patient at the time of the procedure. 4. The patients ability and reliability in recalling and recording the events. 5. The presence and influence of possible secondary gains and/or psychosocial factors. Reported result: Relief experienced during the 1st hour after the procedure: 90 % (Ultra-Short Term Relief) Kristin [name] has indicated area to have been numb during this time. Interpretative annotation: Clinically appropriate result. Analgesia during this period is likely to be Local Anesthetic and/or IV Sedative (Analgesic/Anxiolytic) related.          Effects of local anesthetic: The analgesic effects attained during this period are directly associated to the localized infiltration of local anesthetics and therefore cary significant diagnostic value as to the etiological location, or anatomical origin, of the pain. Expected duration of relief is directly dependent on the pharmacodynamics of the local anesthetic used. Long-acting (4-6 hours) anesthetics used.  Reported result: Relief during the next 4 to 6 hour after the procedure: 75 % (Short-Term Relief) Kristin [name] has indicated area to have been numb during this time. Interpretative annotation: Clinically appropriate result. Analgesia during this period is likely to be Local Anesthetic-related. Partial relief from local anesthetics would suggest that treated area is not 100% responsible for the patient's symptoms.  Long-term benefit: Defined as the period of time past the expected duration of local anesthetics (1 hour for short-acting and  4-6 hours for long-acting).  With the possible exception of prolonged sympathetic blockade from the local anesthetics, benefits during this period are typically attributed to, or associated with, other factors such as analgesic sensory neuropraxia, antiinflammatory effects, or beneficial biochemical changes provided by agents other than the local anesthetics.  Reported result: Extended relief following procedure: 0 % (20%relief the first couple of days) (Long-Term Relief)            Interpretative annotation: Clinically appropriate result. Recurrence of symptoms. No permanent benefit expected. Etiology is likely mechanical rather than inflammatory.          Current benefits: Defined as persistent relief that continues at this point in time.   Reported results: Treated area: 0 %       Interpretative annotation: Recurrence of symptoms. No permanent benefit expected. Effective diagnostic intervention.          Interpretation: Results would suggest a successful diagnostic intervention. The patient has failed to respond to conservative therapies including over-the-counter medications, anti-inflammatories, muscle relaxants, membrane stabilizers, opioids, physical therapy modalities such as heat and ice, as well as more invasive techniques such as nerve blocks. Because Kristin [name] did attain more than 50% relief of the pain during a series of diagnostic blocks conducted in separate occasions, I believe it is medically necessary to proceed with Radiofrequency Ablation, in order to attempt gaining longer relief.          Plan:  Consider Radiofrequency Ablation for the purpose of attaining long-term benefits.  Laboratory Chemistry  Inflammation Markers (CRP: Acute Phase) (ESR: Chronic Phase) Lab Results  Component Value Date   CRP <0.5 09/24/2015   ESRSEDRATE 12 09/24/2015                 Renal Function Markers Lab Results  Component Value Date   BUN 20 12/24/2016   CREATININE 0.98 12/24/2016   GFRAA 59 (L) 12/24/2016   GFRNONAA  51 (L) 12/24/2016                 Hepatic Function Markers Lab Results  Component Value Date   AST 21 03/02/2016   ALT 10 03/02/2016   ALBUMIN 3.7 03/02/2016   ALKPHOS 74 03/02/2016                 Electrolytes Lab Results  Component Value Date   NA 139 12/24/2016   K 4.0 12/24/2016   CL 107 12/24/2016   CALCIUM 9.7 12/24/2016   MG 2.2 09/24/2015                 Neuropathy Markers No results found for: [id number]               Bone Pathology Markers Lab Results  Component Value Date   ALKPHOS 74 03/02/2016   CALCIUM 9.7 12/24/2016                 Coagulation Parameters Lab Results  Component Value Date   INR 1.24 12/16/2015   LABPROT 15.8 (H) 12/16/2015   APTT 26 12/16/2015   PLT 191 03/02/2016                 Cardiovascular Markers Lab Results  Component Value Date   HGB 13.5 03/02/2016   HCT 41.6 03/02/2016                 Note: Lab results reviewed.  Recent Diagnostic Imaging Review  Dg C-arm 1-60 Min-no Report  Result Date: 03/07/2017 Fluoroscopy was utilized  by the requesting physician.  No radiographic interpretation.   Note: Imaging results reviewed.          Meds   Current Outpatient Prescriptions:  .  albuterol (PROVENTIL) (2.5 MG/3ML) 0.083% nebulizer solution, Take 3 mLs (2.5 mg total) by nebulization every 8 (eight) hours as needed. Shortness of breath and wheezing, Disp: 75 mL, Rfl: 12 .  apixaban (ELIQUIS) 5 MG TABS tablet, Take 1 tablet (5 mg total) by mouth 2 (two) times daily., Disp: 60 tablet, Rfl: 0 .  Cholecalciferol (VITAMIN D) 2000 units tablet, Take 2,000 Units by mouth daily. , Disp: , Rfl:  .  furosemide (LASIX) 20 MG tablet, Take 1 tablet (20 mg total) by mouth daily. (Patient taking differently: Take 20 mg by mouth daily. Take 1/2 tablet a day), Disp: 90 tablet, Rfl: 1 .  levocetirizine (XYZAL) 5 MG tablet, Take 1 tablet (5 mg total) by mouth every evening., Disp: 30 tablet, Rfl: 12 .  lisinopril (PRINIVIL,ZESTRIL) 10 MG  tablet, TAKE 1 TABLET BY MOUTH ONCE DAILY., Disp: 90 tablet, Rfl: 3 .  Oxycodone HCl 20 MG TABS, Take 1 tablet (20 mg total) by mouth every 8 (eight) hours as needed., Disp: 90 tablet, Rfl: 0 .  OXYGEN, Inhale 2 L into the lungs at bedtime. , Disp: , Rfl:  .  polyethylene glycol (MIRALAX / GLYCOLAX) packet, Take 17 g by mouth daily as needed., Disp: , Rfl:  .  rOPINIRole (REQUIP) 2 MG tablet, Take 1 tablet (2 mg total) by mouth at bedtime., Disp: 90 tablet, Rfl: 0 .  bisacodyl (DULCOLAX) 5 MG EC tablet, Take 2 tablets (10 mg total) by mouth daily as needed for moderate constipation., Disp: 60 tablet, Rfl: 2 .  Oxycodone HCl 20 MG TABS, Take 1 tablet (20 mg total) by mouth every 8 (eight) hours as needed., Disp: 90 tablet, Rfl: 0 .  Oxycodone HCl 20 MG TABS, Take 1 tablet (20 mg total) by mouth every 8 (eight) hours as needed., Disp: 90 tablet, Rfl: 0  ROS  Constitutional: Denies any fever or chills Gastrointestinal: No reported hemesis, hematochezia, vomiting, or acute GI distress Musculoskeletal: Denies any acute onset joint swelling, redness, loss of ROM, or weakness Neurological: No reported episodes of acute onset apraxia, aphasia, dysarthria, agnosia, amnesia, paralysis, loss of coordination, or loss of consciousness  Allergies  Kristin [name] is allergic to cyclobenzaprine; flexeril [cyclobenzaprine hcl]; and vancomycin.  PFSH  Drug: Kristin [name]  reports that she does not use drugs. Alcohol:  reports that she does not drink alcohol. Tobacco:  reports that she quit smoking about 18 years ago. Her smoking use included Cigarettes. She has a 50.00 pack-year smoking history. She has never used smokeless tobacco. Medical:  has a past medical history of Arthritis; Arthritis; Atrial fibrillation ([place]); Back pain; Bradycardia; Breast cancer ([place]) (2004); Cancer [facility]) (2004); CHF (congestive heart failure) ([place]); Chronic back pain; CKD (chronic kidney disease); COPD (chronic obstructive pulmonary  disease) ([place]); Cystocele; Decubitus ulcers; Gout; Hematuria; Hepatitis C; Hyperlipidemia; Hypertension; Hypomagnesemia (06/25/2015); OAB (overactive bladder); Overactive bladder; Restless leg; Shoulder pain, bilateral; Urinary frequency; UTI (lower urinary tract infection); and Vaginal atrophy. Surgical: Kristin [name]  has a past surgical history that includes Abdominal hysterectomy; Back surgery; Rotator cuff repair; Breast surgery; Cataract extraction w/PHACO (10/19/2011); Cataract extraction w/PHACO (11/02/2011); Carpal tunnel release; Joint replacement; Colon surgery; Pacemaker insertion (N/A, 12/24/2015); and Cholecystectomy. Family: family history includes Breast cancer in her paternal aunt; Breast cancer (age of onset: 15) in her sister; Cancer in  her sister; Heart disease in her father and mother; Kidney disease in her brother; Stroke in her mother.  Constitutional Exam  General appearance: Well nourished, well developed, and well hydrated. In no apparent acute distress Vitals:   04/20/17 1130  BP: 91/61  Pulse: 75  Resp: 16  Temp: 98.2 F (36.8 C)  SpO2: 100%  Weight: 158 lb (71.7 kg)  Height: _0  (1.727 m)   BMI Assessment: Estimated body mass index is 24.02 kg/m as calculated from the following:   Height as of this encounter: _1  (1.727 m).   Weight as of this encounter: 158 lb (71.7 kg).  BMI interpretation table: BMI level Category Range association with higher incidence of chronic pain  <18 kg/m2 Underweight   18.5-24.9 kg/m2 Ideal body weight   25-29.9 kg/m2 Overweight Increased incidence by 20%  30-34.9 kg/m2 Obese (Class I) Increased incidence by 68%  35-39.9 kg/m2 Severe obesity (Class II) Increased incidence by 136%  >40 kg/m2 Extreme obesity (Class III) Increased incidence by 254%   BMI Readings from Last 4 Encounters:  04/20/17 24.02 kg/m  04/19/17 23.90 kg/m  03/17/17 23.78 kg/m  03/07/17 24.02 kg/m   Wt Readings from Last 4 Encounters:  04/20/17 158 lb  (71.7 kg)  04/19/17 157 lb 3.2 oz (71.3 kg)  03/17/17 156 lb 6.4 oz (70.9 kg)  03/07/17 158 lb (71.7 kg)  Psych/Mental status: Alert, oriented x 3 (person, place, & time)       Eyes: PERLA Respiratory: No evidence of acute respiratory distress  Cervical Spine Area Exam  Skin & Axial Inspection: No masses, redness, edema, swelling, or associated skin lesions Alignment: Symmetrical Functional ROM: Decreased ROM      Stability: No instability detected Muscle Tone/Strength: Functionally intact. No obvious neuro-muscular anomalies detected. Sensory (Neurological): Unimpaired Palpation: No palpable anomalies              Upper Extremity (UE) Exam    Side: Right upper extremity  Side: Left upper extremity  Skin & Extremity Inspection: Skin color, temperature, and hair growth are WNL. No peripheral edema or cyanosis. No masses, redness, swelling, asymmetry, or associated skin lesions. No contractures.  Skin & Extremity Inspection: Skin color, temperature, and hair growth are WNL. No peripheral edema or cyanosis. No masses, redness, swelling, asymmetry, or associated skin lesions. No contractures.  Functional ROM: Decreased ROM for shoulder  Functional ROM: Decreased ROM for shoulder  Muscle Tone/Strength: Guarding  Muscle Tone/Strength: Functionally intact. No obvious neuro-muscular anomalies detected.  Sensory (Neurological): Movement-associated pain affecting the shoulder  Sensory (Neurological): Unimpaired          Palpation: Tender              Palpation: No palpable anomalies              Specialized Test(s): Deferred         Specialized Test(s): Deferred          Thoracic Spine Area Exam  Skin & Axial Inspection: No masses, redness, or swelling Alignment: Symmetrical Functional ROM: Unrestricted ROM Stability: No instability detected Muscle Tone/Strength: Functionally intact. No obvious neuro-muscular anomalies detected. Sensory (Neurological): Unimpaired Muscle strength & Tone: No  palpable anomalies  Lumbar Spine Area Exam  Skin & Axial Inspection: No masses, redness, or swelling Alignment: Symmetrical Functional ROM: Minimal ROM, bilaterally Stability: No instability detected Muscle Tone/Strength: Functionally intact. No obvious neuro-muscular anomalies detected. Sensory (Neurological): Movement-associated pain Palpation: Complains of area being tender to palpation  Provocative Tests: Lumbar Hyperextension and rotation test: Improved after treatment       Lumbar Lateral bending test: evaluation deferred today       Patrick's Maneuver: Positive for bilateral S-I arthralgia and for bilateral hip arthralgia  Gait & Posture Assessment  Ambulation: Patient ambulates using a cane Gait: Very limited, using assistive device to ambulate Posture: Difficulty standing up straight, due to pain   Lower Extremity Exam    Side: Right lower extremity  Side: Left lower extremity  Skin & Extremity Inspection: Skin color, temperature, and hair growth are WNL. No peripheral edema or cyanosis. No masses, redness, swelling, asymmetry, or associated skin lesions. No contractures.  Skin & Extremity Inspection: Skin color, temperature, and hair growth are WNL. No peripheral edema or cyanosis. No masses, redness, swelling, asymmetry, or associated skin lesions. No contractures.  Functional ROM: Unrestricted ROM          Functional ROM: Unrestricted ROM          Muscle Tone/Strength: Deconditioned  Muscle Tone/Strength: Deconditioned  Sensory (Neurological): Unimpaired  Sensory (Neurological): Unimpaired  Palpation: No palpable anomalies  Palpation: No palpable anomalies   Assessment  Primary Diagnosis & Pertinent Problem List: The primary encounter diagnosis was Chronic shoulder pain (S/P replacement) (Right). Diagnoses of Shoulder pain S/P prostheses (Right), Chronic pain syndrome, Hip pain, chronic, unspecified laterality, and Chronic sacroiliac joint pain (Left) were also  pertinent to this visit.  Status Diagnosis  Persistent Persistent Persistent 1. Chronic shoulder pain (S/P replacement) (Right)   2. Shoulder pain S/P prostheses (Right)   3. Chronic pain syndrome   4. Hip pain, chronic, unspecified laterality   5. Chronic sacroiliac joint pain (Left)     Problems updated and reviewed during this visit: Problem  Hip Pain, Chronic, Unspecified Laterality   Plan of Care  Pharmacotherapy (Medications Ordered): No orders of the defined types were placed in this encounter.  New Prescriptions   No medications on file   Medications administered today: Kristin [name] had no medications administered during this visit.  Procedure Orders    No procedure(s) ordered today   Lab Orders  No laboratory test(s) ordered today    Imaging Orders     DG Si Joints     DG HIP UNILAT W OR W/O PELVIS 2-3 VIEWS RIGHT     DG HIP UNILAT W OR W/O PELVIS 2-3 VIEWS LEFT Referral Orders  No referral(s) requested today    Interventional management options: Planned, scheduled, and/or pending:   Diagnostic bilateral SI joint x-rays Diagnostic bilateral hip joint x-rays Pending therapeutic right-sided suprascapular nerve RFA under fluoroscopic guidance and IV sedation Pending therapeutic right-sided lumbar facet RFA    Considering:   Diagnostic bilateral sacroiliac joint block  Possible bilateral sacroiliac joint RFA  Diagnostic bilateral intra-articular hip joint injection  Diagnostic bilateral femoral nerve + obturator nerve block  Possible bilateral femoral nerve + obturator nerve RFA  Possible bilateral lumbar facet radiofrequency. (Left side done on 12/13/2016) Palliative bilateral lumbar facet block  Diagnostic right intra-articular shoulder joint injection   Diagnostic right suprascapular nerve block   Possible right suprascapular radiofrequency ablation.    Palliative PRN treatment(s):   Palliative bilateral lumbar facet block  Diagnostic right  intra-articular shoulder joint injection   Diagnostic right suprascapular nerve block   Possible right suprascapular radiofrequency ablation.   Provider-requested follow-up: Return for F/U eval with Dr. Dossie Arbour after test completion.  Future Appointments Date Time Provider [place]  04/27/2017 10:20 AM Alisa Graff,  FNP [facility] None  05/04/2017 1:15 PM Milinda Pointer, MD [facility] None  09/21/2017 10:40 AM Mikey College, NP [facility] None   Primary Care Physician: Mikey College, NP Location: [facility] Note by: Gaspar Cola, MD Date: 04/20/2017; Time: 1:27 PM

## 2017-04-20 ENCOUNTER — Ambulatory Visit
Admission: RE | Admit: 2017-04-20 | Discharge: 2017-04-20 | Disposition: A | Discharge status: Home / Self Care | Payer: Medicare Other | Source: Ambulatory Visit | Attending: Pain Medicine | Admitting: Pain Medicine

## 2017-04-20 ENCOUNTER — Other Ambulatory Visit
Admission: RE | Admit: 2017-04-20 | Discharge: 2017-04-20 | Disposition: A | Discharge status: Home / Self Care | Payer: Medicare Other | Source: Ambulatory Visit | Attending: Nurse Practitioner | Admitting: Nurse Practitioner

## 2017-04-20 ENCOUNTER — Ambulatory Visit: Payer: Medicare Other | Attending: Pain Medicine | Admitting: Pain Medicine

## 2017-04-20 ENCOUNTER — Encounter: Payer: Self-pay | Admitting: Pain Medicine

## 2017-04-20 VITALS — BP 91/61 | HR 75 | Temp 98.2°F | Resp 16 | Ht 68.0 in | Wt 158.0 lb

## 2017-04-20 DIAGNOSIS — M1990 Unspecified osteoarthritis, unspecified site: Secondary | ICD-10-CM | POA: Insufficient documentation

## 2017-04-20 DIAGNOSIS — M25511 Pain in right shoulder: Secondary | ICD-10-CM | POA: Diagnosis not present

## 2017-04-20 DIAGNOSIS — I4892 Unspecified atrial flutter: Secondary | ICD-10-CM | POA: Insufficient documentation

## 2017-04-20 DIAGNOSIS — M160 Bilateral primary osteoarthritis of hip: Secondary | ICD-10-CM | POA: Diagnosis not present

## 2017-04-20 DIAGNOSIS — G894 Chronic pain syndrome: Secondary | ICD-10-CM | POA: Diagnosis not present

## 2017-04-20 DIAGNOSIS — Z823 Family history of stroke: Secondary | ICD-10-CM | POA: Diagnosis not present

## 2017-04-20 DIAGNOSIS — T402X5A Adverse effect of other opioids, initial encounter: Secondary | ICD-10-CM | POA: Insufficient documentation

## 2017-04-20 DIAGNOSIS — I130 Hypertensive heart and chronic kidney disease with heart failure and stage 1 through stage 4 chronic kidney disease, or unspecified chronic kidney disease: Secondary | ICD-10-CM | POA: Diagnosis not present

## 2017-04-20 DIAGNOSIS — N3281 Overactive bladder: Secondary | ICD-10-CM | POA: Diagnosis not present

## 2017-04-20 DIAGNOSIS — I341 Nonrheumatic mitral (valve) prolapse: Secondary | ICD-10-CM | POA: Insufficient documentation

## 2017-04-20 DIAGNOSIS — Z79891 Long term (current) use of opiate analgesic: Secondary | ICD-10-CM | POA: Insufficient documentation

## 2017-04-20 DIAGNOSIS — J449 Chronic obstructive pulmonary disease, unspecified: Secondary | ICD-10-CM | POA: Insufficient documentation

## 2017-04-20 DIAGNOSIS — Z881 Allergy status to other antibiotic agents status: Secondary | ICD-10-CM | POA: Insufficient documentation

## 2017-04-20 DIAGNOSIS — M533 Sacrococcygeal disorders, not elsewhere classified: Secondary | ICD-10-CM | POA: Diagnosis present

## 2017-04-20 DIAGNOSIS — K219 Gastro-esophageal reflux disease without esophagitis: Secondary | ICD-10-CM | POA: Insufficient documentation

## 2017-04-20 DIAGNOSIS — Z8619 Personal history of other infectious and parasitic diseases: Secondary | ICD-10-CM | POA: Insufficient documentation

## 2017-04-20 DIAGNOSIS — Z7901 Long term (current) use of anticoagulants: Secondary | ICD-10-CM | POA: Diagnosis not present

## 2017-04-20 DIAGNOSIS — F5089 Other specified eating disorder: Secondary | ICD-10-CM | POA: Insufficient documentation

## 2017-04-20 DIAGNOSIS — Y838 Other surgical procedures as the cause of abnormal reaction of the patient, or of later complication, without mention of misadventure at the time of the procedure: Secondary | ICD-10-CM | POA: Insufficient documentation

## 2017-04-20 DIAGNOSIS — E785 Hyperlipidemia, unspecified: Secondary | ICD-10-CM | POA: Insufficient documentation

## 2017-04-20 DIAGNOSIS — M5137 Other intervertebral disc degeneration, lumbosacral region: Secondary | ICD-10-CM | POA: Insufficient documentation

## 2017-04-20 DIAGNOSIS — M961 Postlaminectomy syndrome, not elsewhere classified: Secondary | ICD-10-CM | POA: Insufficient documentation

## 2017-04-20 DIAGNOSIS — M25559 Pain in unspecified hip: Secondary | ICD-10-CM | POA: Diagnosis not present

## 2017-04-20 DIAGNOSIS — Z853 Personal history of malignant neoplasm of breast: Secondary | ICD-10-CM | POA: Insufficient documentation

## 2017-04-20 DIAGNOSIS — Z87891 Personal history of nicotine dependence: Secondary | ICD-10-CM | POA: Diagnosis not present

## 2017-04-20 DIAGNOSIS — Z96611 Presence of right artificial shoulder joint: Secondary | ICD-10-CM | POA: Insufficient documentation

## 2017-04-20 DIAGNOSIS — K5903 Drug induced constipation: Secondary | ICD-10-CM | POA: Insufficient documentation

## 2017-04-20 DIAGNOSIS — M25552 Pain in left hip: Secondary | ICD-10-CM | POA: Insufficient documentation

## 2017-04-20 DIAGNOSIS — M48061 Spinal stenosis, lumbar region without neurogenic claudication: Secondary | ICD-10-CM | POA: Insufficient documentation

## 2017-04-20 DIAGNOSIS — Z8249 Family history of ischemic heart disease and other diseases of the circulatory system: Secondary | ICD-10-CM | POA: Insufficient documentation

## 2017-04-20 DIAGNOSIS — M109 Gout, unspecified: Secondary | ICD-10-CM | POA: Insufficient documentation

## 2017-04-20 DIAGNOSIS — Z803 Family history of malignant neoplasm of breast: Secondary | ICD-10-CM | POA: Insufficient documentation

## 2017-04-20 DIAGNOSIS — G8929 Other chronic pain: Secondary | ICD-10-CM

## 2017-04-20 DIAGNOSIS — T8484XA Pain due to internal orthopedic prosthetic devices, implants and grafts, initial encounter: Secondary | ICD-10-CM

## 2017-04-20 DIAGNOSIS — I5032 Chronic diastolic (congestive) heart failure: Secondary | ICD-10-CM | POA: Insufficient documentation

## 2017-04-20 DIAGNOSIS — R001 Bradycardia, unspecified: Secondary | ICD-10-CM | POA: Diagnosis not present

## 2017-04-20 DIAGNOSIS — M545 Low back pain: Secondary | ICD-10-CM | POA: Diagnosis present

## 2017-04-20 DIAGNOSIS — M542 Cervicalgia: Secondary | ICD-10-CM | POA: Insufficient documentation

## 2017-04-20 DIAGNOSIS — Z950 Presence of cardiac pacemaker: Secondary | ICD-10-CM | POA: Diagnosis not present

## 2017-04-20 DIAGNOSIS — Z79899 Other long term (current) drug therapy: Secondary | ICD-10-CM | POA: Insufficient documentation

## 2017-04-20 DIAGNOSIS — Z888 Allergy status to other drugs, medicaments and biological substances status: Secondary | ICD-10-CM | POA: Insufficient documentation

## 2017-04-20 DIAGNOSIS — M25551 Pain in right hip: Secondary | ICD-10-CM | POA: Diagnosis present

## 2017-04-20 DIAGNOSIS — D649 Anemia, unspecified: Secondary | ICD-10-CM | POA: Insufficient documentation

## 2017-04-20 DIAGNOSIS — Z841 Family history of disorders of kidney and ureter: Secondary | ICD-10-CM | POA: Diagnosis not present

## 2017-04-20 DIAGNOSIS — Z8614 Personal history of Methicillin resistant Staphylococcus aureus infection: Secondary | ICD-10-CM | POA: Insufficient documentation

## 2017-04-20 DIAGNOSIS — Z87440 Personal history of urinary (tract) infections: Secondary | ICD-10-CM | POA: Diagnosis not present

## 2017-04-20 DIAGNOSIS — M810 Age-related osteoporosis without current pathological fracture: Secondary | ICD-10-CM | POA: Insufficient documentation

## 2017-04-20 DIAGNOSIS — G2581 Restless legs syndrome: Secondary | ICD-10-CM | POA: Diagnosis not present

## 2017-04-20 DIAGNOSIS — M488X6 Other specified spondylopathies, lumbar region: Secondary | ICD-10-CM | POA: Insufficient documentation

## 2017-04-20 DIAGNOSIS — N183 Chronic kidney disease, stage 3 (moderate): Secondary | ICD-10-CM | POA: Insufficient documentation

## 2017-04-20 DIAGNOSIS — E559 Vitamin D deficiency, unspecified: Secondary | ICD-10-CM | POA: Insufficient documentation

## 2017-04-20 DIAGNOSIS — I4891 Unspecified atrial fibrillation: Secondary | ICD-10-CM | POA: Insufficient documentation

## 2017-04-20 DIAGNOSIS — M4726 Other spondylosis with radiculopathy, lumbar region: Secondary | ICD-10-CM | POA: Insufficient documentation

## 2017-04-20 LAB — FERRITIN: Ferritin: 89 ng/mL (ref 11–307)

## 2017-04-20 LAB — CBC
HCT: 38 % (ref 35.0–47.0)
Hemoglobin: 12.6 g/dL (ref 12.0–16.0)
MCH: 30.2 pg (ref 26.0–34.0)
MCHC: 33.1 g/dL (ref 32.0–36.0)
MCV: 91.1 fL (ref 80.0–100.0)
Platelets: 185 10*3/uL (ref 150–440)
RBC: 4.17 MIL/uL (ref 3.80–5.20)
RDW: 15.8 % — ABNORMAL HIGH (ref 11.5–14.5)
WBC: 6.1 10*3/uL (ref 3.6–11.0)

## 2017-04-20 LAB — IRON AND TIBC
Iron: 58 ug/dL (ref 28–170)
Saturation Ratios: 18 % (ref 10.4–31.8)
TIBC: 316 ug/dL (ref 250–450)
UIBC: 258 ug/dL

## 2017-04-20 NOTE — Progress Notes (Signed)
Safety precautions to be maintained throughout the outpatient stay will include: orient to surroundings, keep bed in low position, maintain call bell within reach at all times, provide assistance with transfer out of bed and ambulation.  

## 2017-04-27 ENCOUNTER — Ambulatory Visit: Payer: Medicare Other | Attending: Family | Admitting: Family

## 2017-04-27 ENCOUNTER — Encounter: Payer: Self-pay | Admitting: Family

## 2017-04-27 VITALS — BP 89/56 | HR 82 | Resp 18 | Ht 68.0 in | Wt 158.1 lb

## 2017-04-27 DIAGNOSIS — I4891 Unspecified atrial fibrillation: Secondary | ICD-10-CM | POA: Diagnosis not present

## 2017-04-27 DIAGNOSIS — Z853 Personal history of malignant neoplasm of breast: Secondary | ICD-10-CM | POA: Insufficient documentation

## 2017-04-27 DIAGNOSIS — M109 Gout, unspecified: Secondary | ICD-10-CM | POA: Insufficient documentation

## 2017-04-27 DIAGNOSIS — E785 Hyperlipidemia, unspecified: Secondary | ICD-10-CM | POA: Insufficient documentation

## 2017-04-27 DIAGNOSIS — K759 Inflammatory liver disease, unspecified: Secondary | ICD-10-CM | POA: Diagnosis not present

## 2017-04-27 DIAGNOSIS — N189 Chronic kidney disease, unspecified: Secondary | ICD-10-CM | POA: Insufficient documentation

## 2017-04-27 DIAGNOSIS — I10 Essential (primary) hypertension: Secondary | ICD-10-CM

## 2017-04-27 DIAGNOSIS — Z87891 Personal history of nicotine dependence: Secondary | ICD-10-CM | POA: Diagnosis not present

## 2017-04-27 DIAGNOSIS — J449 Chronic obstructive pulmonary disease, unspecified: Secondary | ICD-10-CM | POA: Diagnosis not present

## 2017-04-27 DIAGNOSIS — I5032 Chronic diastolic (congestive) heart failure: Secondary | ICD-10-CM | POA: Diagnosis present

## 2017-04-27 DIAGNOSIS — I130 Hypertensive heart and chronic kidney disease with heart failure and stage 1 through stage 4 chronic kidney disease, or unspecified chronic kidney disease: Secondary | ICD-10-CM | POA: Diagnosis not present

## 2017-04-27 DIAGNOSIS — I110 Hypertensive heart disease with heart failure: Secondary | ICD-10-CM | POA: Diagnosis present

## 2017-04-27 NOTE — Progress Notes (Addendum)
Agree with pharmacist note below.  Physical exam done by myself.  Patient has been weighing daily and she was reminded to call for an overnight weight gain of >2 pounds or a weekly weight gain of >5 pounds  Reinforced to decrease her lisinopril to 1/2 tablet daily which would be 5mg  daily. Does check her blood pressure at home.   [name], FNP [facility]    [name], female    DOB: 1929/09/10, 83 y.o.   MRN: [id number]  HPI  Kristin [name] is a 83 y/o female with a history of atrial fibrillation, right breast cancer, chronic back pain, CKD, COPD, gout, hepatitis, hyperlipidemia, HTN, hypomagnesium and chronic heart failure  Reviewed echo report on 10/29/15 which showed an EF of 55-65% with moderate MR.   Admitted 11/16/16 due to cholecystitis and e coli bacteremia. Treated with IV antibiotics and then transitioned to oral antibiotics. Had ERCP done with stone removal prior to lap choley. Discharged home. Was in the ED 08/22/16 due to shoulder pain after taking down Christmas lights. Evaluated and discharged home.   Patient presents today for follow-up visit with a chief complaint of fatigue. She describes it as chronic in nature and has been present for many years. Does improve with rest. Has associated shortness of breath and left foot swelling which she says might be because of a possible bunion.    Past Medical History:  Diagnosis Date  . Arthritis   . Arthritis   . Atrial fibrillation ([place])   . Back pain    lower back chronic  . Bradycardia   . Breast cancer ([place]) 2004   right breast cancer  . Cancer [facility]) 2004   rt breast cancer-post lumpectomy- chemo/rad  . CHF (congestive heart failure) ([place])   . Chronic back pain   . CKD (chronic kidney disease)   . COPD (chronic obstructive pulmonary disease) ([facility])    wears O2 at 2L via [place] at night  . Cystocele   . Decubitus ulcers   . Gout   . Hematuria   . Hepatitis C   . Hyperlipidemia   . Hypertension   .  Hypomagnesemia 06/25/2015  . OAB (overactive bladder)   . Overactive bladder   . Restless leg   . Shoulder pain, bilateral   . Urinary frequency   . UTI (lower urinary tract infection)   . Vaginal atrophy    Past Surgical History:  Procedure Laterality Date  . ABDOMINAL HYSTERECTOMY    . BACK SURGERY    . BREAST SURGERY     rt lumpectomy  . CARPAL TUNNEL RELEASE    . CATARACT EXTRACTION W/PHACO  10/19/2011   Procedure: CATARACT EXTRACTION PHACO AND INTRAOCULAR LENS PLACEMENT (IOC);  Surgeon: Elta Guadeloupe T. Gershon Crane, MD;  Location: [facility];  Service: Ophthalmology;  Laterality: Right;  CDE:10.81  . CATARACT EXTRACTION W/PHACO  11/02/2011   Procedure: CATARACT EXTRACTION PHACO AND INTRAOCULAR LENS PLACEMENT (IOC);  Surgeon: Elta Guadeloupe T. Gershon Crane, MD;  Location: [facility];  Service: Ophthalmology;  Laterality: Left;  CDE 8.60  . CHOLECYSTECTOMY     3/18  . COLON SURGERY    . JOINT REPLACEMENT     bilateral TKA  . PACEMAKER INSERTION N/A 12/24/2015   Procedure: INSERTION PACEMAKER;  Surgeon: Isaias Cowman, MD;  Location: [facility];  Service: Cardiovascular;  Laterality: N/A;  . ROTATOR CUFF REPAIR     bilateral   Family History  Problem Relation Age of Onset  . Kidney disease Brother   .  Heart disease Father   . Heart disease Mother   . Stroke Mother   . Cancer Sister   . Breast cancer Sister 71  . Breast cancer Paternal Aunt   . Anesthesia problems Neg Hx   . Hypotension Neg Hx   . Malignant hyperthermia Neg Hx   . Pseudochol deficiency Neg Hx    Social History  Substance Use Topics  . Smoking status: Former Smoker    Packs/day: 1.25    Years: 40.00    Types: Cigarettes    Quit date: 12/16/1998  . Smokeless tobacco: Never Used  . Alcohol use No   Allergies  Allergen Reactions  . Cyclobenzaprine Rash and Hives    Severe Rash  . Flexeril [Cyclobenzaprine Hcl]     Severe Rash  . Vancomycin Rash    [place] Syndrome   Prior to Admission medications   Medication Sig Start Date  End Date Taking? Authorizing Provider  albuterol (PROVENTIL) (2.5 MG/3ML) 0.083% nebulizer solution Take 3 mLs (2.5 mg total) by nebulization every 8 (eight) hours as needed. Shortness of breath and wheezing 02/19/16  Yes Arlis Porta., MD  apixaban (ELIQUIS) 5 MG TABS tablet Take 1 tablet (5 mg total) by mouth 2 (two) times daily. 10/30/15  Yes Wieting, [name], MD  aspirin EC 325 MG tablet Take 325 mg by mouth daily.    Yes [provider]  bisacodyl (DULCOLAX) 5 MG EC tablet Take 2 tablets (10 mg total) by mouth daily as needed for moderate constipation. 12/13/16 03/13/17 Yes Milinda Pointer, MD  Cholecalciferol (VITAMIN D) 2000 units tablet Take 2,000 Units by mouth daily.    Yes [provider]  furosemide (LASIX) 40 MG tablet Take 1/2 tablet (20 mg total) by mouth daily. 12/10/16  Yes Mikey College, NP  levocetirizine (XYZAL) 5 MG tablet Take 1 tablet (5 mg total) by mouth every evening. 06/22/16  Yes Arlis Porta., MD  lisinopril (PRINIVIL,ZESTRIL) 10 MG tablet TAKE 1 TABLET BY MOUTH ONCE DAILY. 09/10/16  Yes Arlis Porta., MD  Oxycodone HCl 20 MG TABS Take 1 tablet (20 mg total) by mouth every 8 (eight) hours as needed. 02/11/17 03/13/17 Yes Milinda Pointer, MD  Oxycodone HCl 20 MG TABS Take 1 tablet (20 mg total) by mouth every 8 (eight) hours as needed. 12/13/16 01/12/17 Yes Milinda Pointer, MD  Oxycodone HCl 20 MG TABS Take 1 tablet (20 mg total) by mouth every 8 (eight) hours as needed. 01/12/17 02/11/17 Yes Milinda Pointer, MD  OXYGEN Inhale 2 Doses into the lungs at bedtime.   Yes [provider]  polyethylene glycol (MIRALAX / GLYCOLAX) packet Take 17 g by mouth daily as needed. 11/22/16  Yes [provider]  rOPINIRole (REQUIP) 2 MG tablet Take 1 tablet (2 mg total) by mouth at bedtime. 12/13/16 03/13/17 Yes Milinda Pointer, MD  Colchicine 0.6 MG CAPS Take 2 pills followed by 1 pill in 1 hour at the start of your gout  flare. Patient not taking: Reported on 12/24/2016 12/23/16   Mikey College, NP     Review of Systems  Constitutional: Positive for fatigue. Negative for appetite change.  HENT: Positive for congestion (in the mornings). Negative for postnasal drip and sore throat.   Eyes: Negative.   Respiratory: Positive for cough (in the mornings) and shortness of breath (walking up stairs and walking long distantces). Negative for chest tightness.   Cardiovascular: Negative for chest pain, palpitations and leg swelling.  Gastrointestinal:  Negative for abdominal distention and abdominal pain.  Endocrine: Negative.   Genitourinary: Negative.   Musculoskeletal: Positive for arthralgias (left great toe) and back pain. Negative for neck stiffness.  Skin: Negative.   Allergic/Immunologic: Negative.   Neurological: Negative for dizziness and light-headedness.  Hematological: Negative for adenopathy. Bruises/bleeds easily.  Psychiatric/Behavioral: Negative for dysphoric mood and sleep disturbance (sleeping on 1 pillow; wearing oxygen @ 2L at bedtime). The patient is not nervous/anxious.    Vitals:   04/27/17 1040  BP: (!) 89/56  Pulse: 82  Resp: 18  SpO2: 97%  Weight: 158 lb 2 oz (71.7 kg)  Height: 5\' 8"  (1.727 m)   Wt Readings from Last 3 Encounters:  04/27/17 158 lb 2 oz (71.7 kg)  04/20/17 158 lb (71.7 kg)  04/19/17 157 lb 3.2 oz (71.3 kg)    Lab Results  Component Value Date   CREATININE 0.98 12/24/2016   CREATININE 1.26 (H) 08/31/2016   CREATININE 1.16 (H) 03/02/2016    Physical Exam  Constitutional: She is oriented to person, place, and time. She appears well-developed and well-nourished.  HENT:  Head: Normocephalic and atraumatic.  Neck: Normal range of motion. Neck supple. No JVD present.  Cardiovascular: Normal rate and regular rhythm.   Pulmonary/Chest: Effort normal. She has no wheezes. She has no rales.  Abdominal: Soft. She exhibits no distension. There is no tenderness.   Musculoskeletal: She exhibits edema (trace pitting edema around bilateral ankles). She exhibits no tenderness.  Neurological: She is alert and oriented to person, place, and time.  Skin: Skin is warm and dry.  Psychiatric: She has a normal mood and affect. Her behavior is normal. Thought content normal.  Nursing note and vitals reviewed.  Assessment & Plan:  1: Chronic heart failure with preserved ejection fraction- - NYHA class III - Euvolemic today - Short of breath when walking up and down stairs to do laundry, but reports this has been an ongoing issue and is not a new occurrence.  - Saw cardiologist ([facility]) 02/02/17 - Previously reported having echocardiogram done on 01/21/17 - Wearing oxygen at 2L at bedtime   2: HTN-  - BP has been running low, 89/56 today; Denies any dizzy or lightheadedness.  - Checks BP twice daily at home -  Furosemide dose was previously cut in half due to low blood pressure. Today will decrease lisinopril to 5mg  daily.  - Will see PCP Merrilyn Puma) in Jan 2019.   Patient did not bring in a medication list.  Return here in 3 months or sooner for any questions/problems before then.   Candelaria Stagers, PharmD Pharmacy Resident  04/27/17

## 2017-04-27 NOTE — Patient Instructions (Addendum)
Continue weighing daily and call for an overnight weight gain of > 2 pounds or a weekly weight gain of >5 pounds.  Decrease lisinopril to 1/2 tablet daily (you'll now be taking 5mg  daily)

## 2017-04-28 ENCOUNTER — Ambulatory Visit: Payer: Medicare Other | Admitting: Pain Medicine

## 2017-04-28 ENCOUNTER — Other Ambulatory Visit: Payer: Self-pay | Admitting: *Deleted

## 2017-04-28 NOTE — Patient Outreach (Signed)
[facility]) Care Management  04/28/2017  LONNI DIRDEN [birth year] [id number]   Care coordination  Follow up call to the hearing aid company for Ms Aleece Loyd with Yet[facility] [employer] who reports [facility] is not available at this time  Alyssa answered questions about being in network with ms [facility] and the general cost of hearing aid out of pocket expenses She informed [facility] CM that her company does not file with any insurance company, was not in network with insurance companies and generally the cost of a hearing aid is $299 [phone] depending on the patient's hearing loss [facility] CM updated [name], Ms Oser grand daughter and Ms Maclaren  Plans to follow up with Ms Sprecher in 1-2 weeks for any further needs  Joelene Millin L. Lavina Hamman, RN, BSN, [facility] [phone]

## 2017-05-04 ENCOUNTER — Ambulatory Visit: Payer: Medicare Other | Attending: Pain Medicine | Admitting: Pain Medicine

## 2017-05-04 ENCOUNTER — Encounter: Payer: Self-pay | Admitting: Pain Medicine

## 2017-05-04 VITALS — BP 119/71 | HR 111 | Temp 98.1°F | Resp 16 | Ht 68.0 in | Wt 158.0 lb

## 2017-05-04 DIAGNOSIS — G2581 Restless legs syndrome: Secondary | ICD-10-CM | POA: Diagnosis not present

## 2017-05-04 DIAGNOSIS — E559 Vitamin D deficiency, unspecified: Secondary | ICD-10-CM | POA: Diagnosis not present

## 2017-05-04 DIAGNOSIS — T402X5A Adverse effect of other opioids, initial encounter: Secondary | ICD-10-CM | POA: Insufficient documentation

## 2017-05-04 DIAGNOSIS — Z950 Presence of cardiac pacemaker: Secondary | ICD-10-CM | POA: Insufficient documentation

## 2017-05-04 DIAGNOSIS — I5032 Chronic diastolic (congestive) heart failure: Secondary | ICD-10-CM | POA: Diagnosis not present

## 2017-05-04 DIAGNOSIS — G894 Chronic pain syndrome: Secondary | ICD-10-CM

## 2017-05-04 DIAGNOSIS — M542 Cervicalgia: Secondary | ICD-10-CM

## 2017-05-04 DIAGNOSIS — M5030 Other cervical disc degeneration, unspecified cervical region: Secondary | ICD-10-CM

## 2017-05-04 DIAGNOSIS — M48061 Spinal stenosis, lumbar region without neurogenic claudication: Secondary | ICD-10-CM | POA: Insufficient documentation

## 2017-05-04 DIAGNOSIS — E1122 Type 2 diabetes mellitus with diabetic chronic kidney disease: Secondary | ICD-10-CM | POA: Diagnosis not present

## 2017-05-04 DIAGNOSIS — M5010 Cervical disc disorder with radiculopathy, unspecified cervical region: Secondary | ICD-10-CM | POA: Insufficient documentation

## 2017-05-04 DIAGNOSIS — Z79899 Other long term (current) drug therapy: Secondary | ICD-10-CM | POA: Diagnosis not present

## 2017-05-04 DIAGNOSIS — I4892 Unspecified atrial flutter: Secondary | ICD-10-CM | POA: Diagnosis not present

## 2017-05-04 DIAGNOSIS — M25551 Pain in right hip: Secondary | ICD-10-CM | POA: Insufficient documentation

## 2017-05-04 DIAGNOSIS — M5442 Lumbago with sciatica, left side: Secondary | ICD-10-CM

## 2017-05-04 DIAGNOSIS — K5903 Drug induced constipation: Secondary | ICD-10-CM

## 2017-05-04 DIAGNOSIS — Z881 Allergy status to other antibiotic agents status: Secondary | ICD-10-CM | POA: Insufficient documentation

## 2017-05-04 DIAGNOSIS — J449 Chronic obstructive pulmonary disease, unspecified: Secondary | ICD-10-CM | POA: Diagnosis not present

## 2017-05-04 DIAGNOSIS — M25552 Pain in left hip: Secondary | ICD-10-CM | POA: Insufficient documentation

## 2017-05-04 DIAGNOSIS — K219 Gastro-esophageal reflux disease without esophagitis: Secondary | ICD-10-CM | POA: Insufficient documentation

## 2017-05-04 DIAGNOSIS — E785 Hyperlipidemia, unspecified: Secondary | ICD-10-CM | POA: Diagnosis not present

## 2017-05-04 DIAGNOSIS — Z7901 Long term (current) use of anticoagulants: Secondary | ICD-10-CM | POA: Diagnosis not present

## 2017-05-04 DIAGNOSIS — I130 Hypertensive heart and chronic kidney disease with heart failure and stage 1 through stage 4 chronic kidney disease, or unspecified chronic kidney disease: Secondary | ICD-10-CM | POA: Insufficient documentation

## 2017-05-04 DIAGNOSIS — K5900 Constipation, unspecified: Secondary | ICD-10-CM | POA: Insufficient documentation

## 2017-05-04 DIAGNOSIS — M109 Gout, unspecified: Secondary | ICD-10-CM | POA: Diagnosis not present

## 2017-05-04 DIAGNOSIS — J302 Other seasonal allergic rhinitis: Secondary | ICD-10-CM | POA: Insufficient documentation

## 2017-05-04 DIAGNOSIS — N183 Chronic kidney disease, stage 3 (moderate): Secondary | ICD-10-CM | POA: Insufficient documentation

## 2017-05-04 DIAGNOSIS — M79604 Pain in right leg: Secondary | ICD-10-CM

## 2017-05-04 DIAGNOSIS — F1190 Opioid use, unspecified, uncomplicated: Secondary | ICD-10-CM

## 2017-05-04 DIAGNOSIS — Z79891 Long term (current) use of opiate analgesic: Secondary | ICD-10-CM

## 2017-05-04 DIAGNOSIS — I4891 Unspecified atrial fibrillation: Secondary | ICD-10-CM | POA: Insufficient documentation

## 2017-05-04 DIAGNOSIS — M4722 Other spondylosis with radiculopathy, cervical region: Secondary | ICD-10-CM | POA: Diagnosis not present

## 2017-05-04 DIAGNOSIS — M5441 Lumbago with sciatica, right side: Secondary | ICD-10-CM

## 2017-05-04 DIAGNOSIS — Z853 Personal history of malignant neoplasm of breast: Secondary | ICD-10-CM | POA: Diagnosis not present

## 2017-05-04 DIAGNOSIS — M5412 Radiculopathy, cervical region: Secondary | ICD-10-CM

## 2017-05-04 DIAGNOSIS — M79605 Pain in left leg: Secondary | ICD-10-CM

## 2017-05-04 DIAGNOSIS — I341 Nonrheumatic mitral (valve) prolapse: Secondary | ICD-10-CM | POA: Insufficient documentation

## 2017-05-04 DIAGNOSIS — Z87891 Personal history of nicotine dependence: Secondary | ICD-10-CM | POA: Insufficient documentation

## 2017-05-04 DIAGNOSIS — G8929 Other chronic pain: Secondary | ICD-10-CM | POA: Insufficient documentation

## 2017-05-04 MED ORDER — BISACODYL 5 MG PO TBEC: 10.0000 mg | DELAYED_RELEASE_TABLET | Freq: Every day | ORAL | 2 refills | Status: DC | PRN

## 2017-05-04 MED ORDER — OXYCODONE HCL 20 MG PO TABS: 20.0000 mg | ORAL_TABLET | Freq: Three times a day (TID) | ORAL | 0 refills | Status: DC | PRN

## 2017-05-04 MED ORDER — ROPINIROLE HCL 2 MG PO TABS: 2.0000 mg | ORAL_TABLET | Freq: Every day | ORAL | 2 refills | Status: DC

## 2017-05-04 MED ORDER — POLYETHYLENE GLYCOL 3350 17 G PO PACK: 17.0000 g | PACK | Freq: Every day | ORAL | 2 refills | Status: DC | PRN

## 2017-05-04 NOTE — Progress Notes (Signed)
Nursing Pain Medication Assessment:  Safety precautions to be maintained throughout the outpatient stay will include: orient to surroundings, keep bed in low position, maintain call bell within reach at all times, provide assistance with transfer out of bed and ambulation.  Medication Inspection Compliance: Pill count conducted under aseptic conditions, in front of the patient. Neither the pills nor the bottle was removed from the patient's sight at any time. Once count was completed pills were immediately returned to the patient in their original bottle.  Medication: Oxycodone IR Pill/Patch Count: 16 of 90 pills remain Pill/Patch Appearance: Markings consistent with prescribed medication Bottle Appearance: Standard pharmacy container. Clearly labeled. Filled Date: 08/21 / 2018 Last Medication intake:  Today

## 2017-05-04 NOTE — Progress Notes (Signed)
Patient's Name: Kristin [name]  MRN: [id number]  Referring Provider: Mikey College, *  DOB: [birth year]  PCP: Mikey College, NP  DOS: 05/04/2017  Note by: Gaspar Cola, MD  Service setting: Ambulatory outpatient  Specialty: Interventional Pain Management  Location: [facility] (AMB) Pain Management Facility    Patient type: Established   Primary Reason(s) for Visit: Evaluation of chronic illnesses with exacerbation, or progression (Level of risk: moderate) CC: Back Pain (lower); Hip Pain (left); and Shoulder Pain (right)  HPI  Kristin [name] is a 83 y.o. year old, female patient, who comes today for a follow-up evaluation. She has Chronic kidney disease (CKD), stage III (moderate); DDD (degenerative disc disease), lumbosacral; Gout; Benign essential HTN; Lumbar central spinal stenosis; Chronic obstructive pulmonary disease ([place]); Chemical diabetes; History of cardiac catheterization; Seasonal allergies; History of breast cancer; Chronic pain syndrome; Chronic low back pain (Primary Source of Pain) (Bilateral) (L>R); Lumbar facet syndrome (Bilateral) (L>R); Lumbar spondylosis; Chronic lumbar radicular pain (Bilateral) (L>R) (L5 Dermatome); Restless leg syndrome; Myofascial pain; Neurogenic pain; Long term current use of opiate analgesic; Long term prescription opiate use; Opiate use (90 MME/Day); Opioid-induced constipation (OIC); Vitamin D insufficiency; Grade 1 Retrolisthesis of L1 over L2; Lumbar facet hypertrophy (L1-2, L2-3, and L4-5); Lumbar lateral recess stenosis (L1-2 and L4-5); 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); Epidural fibrosis; Lumbar foraminal stenosis (Severe Left L4-5); 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); Osteoporosis; Chronic sacroiliac joint pain (Left); History of methicillin resistant staphylococcus aureus (MRSA); Mitral valve prolapse; New onset atrial flutter (HCC); GERD (gastroesophageal reflux disease); Chronic shoulder pain (S/P  replacement) (Right); Osteoarthritis, multiple sites; Shoulder pain S/P prostheses (Right); Chronic diastolic CHF (congestive heart failure) ([place]); S/P cardiac pacemaker procedure; Anticoagulated; Hyperlipidemia, unspecified; Chronic neck pain; Chronic hip pain (Bilateral); Chronic cervical radicular pain  (Left); Long term current use of anticoagulant; Chronic lower extremity pain (Secondary Area of Pain) (Bilateral) (L>R); and DDD (degenerative disc disease), cervical on her problem list. Kristin [name] was last seen on 04/20/2017. Her primarily concern today is the Back Pain (lower); Hip Pain (left); and Shoulder Pain (right)  Pain Assessment: Location: Lower Back Radiating: left hip Onset: More than a month ago Duration: Chronic pain Quality: Aching Severity: 5 /10 (self-reported pain score)  Note: Reported level is inconsistent with clinical observations. Clinically the patient looks like a 2/10 Information on the proper use of the pain scale provided to the patient today Timing: Constant Modifying factors: medications  Further details on both, my assessment(s), as well as the proposed treatment plan, please see below.  Laboratory Chemistry  Inflammation Markers (CRP: Acute Phase) (ESR: Chronic Phase) Lab Results  Component Value Date   CRP <0.5 09/24/2015   ESRSEDRATE 12 09/24/2015                 Renal Function Markers Lab Results  Component Value Date   BUN 20 12/24/2016   CREATININE 0.98 12/24/2016   GFRAA 59 (L) 12/24/2016   GFRNONAA 51 (L) 12/24/2016                 Hepatic Function Markers Lab Results  Component Value Date   AST 21 03/02/2016   ALT 10 03/02/2016   ALBUMIN 3.7 03/02/2016   ALKPHOS 74 03/02/2016                 Electrolytes Lab Results  Component Value Date   NA 139 12/24/2016   K 4.0 12/24/2016   CL 107 12/24/2016   CALCIUM 9.7  12/24/2016   MG 2.2 09/24/2015                 Neuropathy Markers No results found for: [id number]                Bone Pathology Markers Lab Results  Component Value Date   ALKPHOS 74 03/02/2016   CALCIUM 9.7 12/24/2016                 Coagulation Parameters Lab Results  Component Value Date   INR 1.24 12/16/2015   LABPROT 15.8 (H) 12/16/2015   APTT 26 12/16/2015   PLT 185 04/20/2017                 Cardiovascular Markers Lab Results  Component Value Date   HGB 12.6 04/20/2017   HCT 38.0 04/20/2017                 Note: Lab results reviewed.  Recent Diagnostic Imaging Review  Dg Si Joints Result Date: 04/20/2017 CLINICAL DATA:  Hip pain, no known injury, initial encounter EXAM: BILATERAL SACROILIAC JOINTS - 3+ VIEW COMPARISON:  None. FINDINGS: Degenerative changes are noted at the lumbosacral junction. The sacral ala are within normal limits. No significant sclerosis of the sacroiliac joints is noted. Degenerative changes of the hip joints right greater than left is noted. IMPRESSION: No acute abnormality seen. Electronically Signed   By: [name] M.D.   On: 04/20/2017 15:43   Dg Hip Unilat W Or W/o Pelvis 2-3 Views Left Result Date: 04/20/2017 CLINICAL DATA:  Hip pain, initial encounter EXAM: DG HIP (WITH OR WITHOUT PELVIS) 3V LEFT COMPARISON:  None. FINDINGS: Degenerative changes of left hip joint are seen. There somewhat less marked than that seen on the right. No soft tissue abnormality is seen. IMPRESSION: Degenerative change without acute abnormality. Electronically Signed   By: [name] M.D.   On: 04/20/2017 15:44   Dg Hip Unilat W Or W/o Pelvis 2-3 Views Right Result Date: 04/20/2017 CLINICAL DATA:  Bilateral hip pain EXAM: DG HIP (WITH OR WITHOUT PELVIS) 2-3V RIGHT COMPARISON:  None. FINDINGS: Pelvic ring is intact. Degenerative changes of the right hip joint are noted somewhat more marked than that seen on the left side. No acute fracture or dislocation is noted. No soft tissue abnormality is seen. IMPRESSION: Degenerative change of the right hip. Electronically Signed    By: [name] M.D.   On: 04/20/2017 15:43   Cervical Imaging: Cervical DG complete:  Results for orders placed during the hospital encounter of 03/01/17  DG Cervical Spine Complete   Narrative CLINICAL DATA:  Pt reports neck pain radiating to left shoulder x 1 week. Denies injury. Denies hx of the same  EXAM: CERVICAL SPINE - COMPLETE 4+ VIEW  COMPARISON:  None.  FINDINGS: Disc desiccations at the C5-6 and C6-7 levels, moderate in degree, with associated disc space narrowings and mild osseous spurring. Associated osseous neural foramen narrowings noted at multiple levels.  3-4 mm anterolisthesis of C4 on C5, likely related to the underlying degenerative change, appearing slightly more pronounced on the swimmer's view. Also mild scoliosis.  No acute or suspicious osseous finding. No fracture line or displaced fracture fragment seen.  Bilateral carotid atherosclerosis. Visualized paravertebral soft tissues are otherwise unremarkable.  IMPRESSION: 1. Degenerative changes of the mid and lower cervical spine, mild to moderate in degree, with associated osseous neural foramen narrowings with possible associated nerve root impingement. If symptomatic could be radiculopathic in nature, would  consider nonemergent cervical spine MRI for further characterization. 2. 3-4 mm anterolisthesis of C4 on C5, likely related to the underlying degenerative change. This anterolisthesis appears slightly more pronounced on the swimmer's view raising the possibility of an associated instability at this level. Consider further plain film evaluation with flexion and extension views. 3. No acute appearing findings.   Electronically Signed   By: Franki Cabot M.D.   On: 03/01/2017 15:38    Cervical DG Myelogram views: No results found for this or any previous visit. Cervical DG Myelogram views: No results found for this or any previous visit. Cervical Discogram views: No results found for  this or any previous visit.  Shoulder Imaging: Shoulder-R MR wo contrast:  Results for orders placed in visit on 10/28/08  MR Shoulder Right Wo Contrast   Narrative * PRIOR REPORT IMPORTED FROM AN EXTERNAL SYSTEM *   PRIOR REPORT IMPORTED FROM THE [facility] WORKFLOW SYSTEM   REASON FOR EXAM:    Rt shoulder pain  COMMENTS:  [phone]   PROCEDURE:     MR  - MR SHOULDER RT  WO CONTRAST  - Oct 28 2008  4:14PM   RESULT:     Multiplanar/multisequence imaging of the right shoulder was  obtained without administration of Gadolinium.   The biceps tendon is identified within the bicipital groove and is grossly  unremarkable (this study is degraded by motion artifact). Evaluation of  the  osseous structures demonstrates multiple subchondral cysts along the  humeral  head as well as areas of irregularity. There are cystic changes involving  the acromioclavicular joint as well as an element of marrow edema. The  humeral head is superiorly subluxed. There does not appear to be evidence  of  acute fracture. The supraspinatus tendon is absent and there is evidence  of  muscle retraction. There is also significant muscle atrophy involving the  supraspinatus muscle. The infraspinatus muscle also demonstrates atrophy  and  increased T2 signal is appreciated within the expected course of the  infraspinatus tendon. Fluid signal is identified in the region of the  teres  minor though the muscle appears to be intact. Evaluation of the  subscapularis tendon demonstrates diffuse thinning along the distal aspect  of the tendon. Increased T2 signal is appreciated deep to the tendon as  well  as along the surface of the tendon. Fluid signal is appreciated in the  subacromial and subdeltoid bursal regions. Subchondral cysts are also  identified within the glenoid.   IMPRESSION:   1.     Severe degenerative changes involving the shoulder with a  high-riding  humeral head.  2.     Findings consistent  with complete tear of the supraspinatus tendon  with high-grade partial tear and partial complete tear of the  infraspinatus  tendon. There is atrophy of the muscle bellies and retraction.  3.     High-grade partial tear versus complete tear without retraction of  the subscapularis tendon as well as an element of tendinosis.  4.     Not mentioned above, there is an area of moderate thickening of the  mid to proximal portion of the subscapularis tendon.   Thank you for this opportunity to contribute to the care of your  patient.     Shoulder-L CT wo contrast:  Results for orders placed during the hospital encounter of 01/04/11  CT Shoulder Left Wo Contrast   Narrative *RADIOLOGY REPORT*  Clinical Data: Left shoulder pain for several months.  Weakness.  CT OF THE LEFT SHOULDER WITHOUT CONTRAST  Technique:  Multidetector CT imaging was performed according to the standard protocol. Multiplanar CT image reconstructions were also generated.  Comparison: None.  Findings: The scan demonstrates severe osteoarthritis of the glenohumeral joint with subchondral cysts and extensive erosions of the humeral head and glenoid.  There is marked narrowing of the space between the acromion and humeral head consistent with a chronic full-thickness rotator cuff tear.   There is severe atrophy of the infraspinatus and supraspinatus muscles.  There is a type 3 acromion with erosions of the distal clavicle.  Note is made of emphysematous disease in the left lung.  There is also a 6.4 mm nodule in the left upper lobe along the major fissure.  This could be an intrapulmonary lymph node but I cannot exclude a lung neoplasm.  I recommend a follow-up chest CT scan in 3-6 months without contrast for further evaluation of this nodule.  Note is made of extensive coronary artery calcification.  IMPRESSION:  1.  Severe osteoarthritis of the left glenohumeral joint with a chronic rotator cuff tear and  severe atrophy of the infraspinatus and supraspinatus muscles. 2.  6 mm nodule in the left lung along the major fissure, possibly an intrapulmonary lymph node.  However, I cannot exclude a lung neoplasm.  Follow-up CT scan without contrast in 3-6 months is recommended. 3.  Emphysematous disease. 4.  Extensive coronary artery calcifications.  Original Report Authenticated By: Larey Seat, M.D.   Shoulder-R DG:  Results for orders placed in visit on 04/08/09  DG Shoulder Right   Narrative * PRIOR REPORT IMPORTED FROM AN EXTERNAL SYSTEM *   PRIOR REPORT IMPORTED FROM THE [facility] WORKFLOW SYSTEM   REASON FOR EXAM:    postop  COMMENTS:   Bedside (portable):Y   PROCEDURE:     DXR - DXR SHOULDER RIGHT COMPLETE  - Apr 08 2009  4:30PM   RESULT:     Right shoulder evaluation dated 04/08/2009   The patient is status post right shoulder prostheses placement. A surgical  drain is identified within the right shoulder soft tissues. Skin staples  also identified. The native osseous structures demonstrate near anatomic  alignment. There is no gross evidence of fracture no dislocation. The  hardware appears intact.   IMPRESSION:      Patient is status post right shoulder prostheses  placement  the remainder of the interpretational left to the performing physician.       Shoulder-L DG:  Results for orders placed during the hospital encounter of 08/22/16  DG Shoulder Left   Narrative CLINICAL DATA:  Shoulder pain after taking down Christmas lights.  EXAM: LEFT SHOULDER - 2+ VIEW  COMPARISON:  LEFT shoulder radiograph February 19, 2011  FINDINGS: Bony fragments project in the inferior glenohumeral joint space, no donor site. No dislocation. LEFT shoulder arthroplasty. Intact well-seated non cemented hardware without periprosthetic lucency. Cardiac pacemaker LEFT chest. Soft tissue planes are nonsuspicious.  IMPRESSION: Bony fragments LEFT inferior glenohumeral joint space concerning  for acute fracture, possible heterotopic ossification.   Electronically Signed   By: Elon Alas M.D.   On: 08/22/2016 22:21    Lumbosacral Imaging: Lumbar MR wo contrast:  Results for orders placed in visit on 07/21/09  MR L Spine Ltd W/O Cm   Narrative * PRIOR REPORT IMPORTED FROM AN EXTERNAL SYSTEM *   PRIOR REPORT IMPORTED FROM THE [facility] WORKFLOW SYSTEM   REASON FOR EXAM:    back pain  COMMENTS:  [id number]   PROCEDURE:     MR  - MR LUMBAR SPINE WO CONTRAST  - Jul 21 2009  3:23PM   RESULT:   HISTORY: Back pain.   PROCEDURE AND FINDINGS:  Multiplanar, multisequence imaging of the lumbar  spine was obtained. Multilevel degeneration is present. Annular bulge is  present at L1-L2. This along with facetal hypertrophy results in mild  flattening of the lateral recesses. Annual bulge is present at L4-L5.  Facetal hypertrophy along with hypertrophy of the ligamentum flavum  results  in flattening of the lateral recesses at this level. Small L5-S1 disc  protrusion is present. No acute bony abnormality is identified. The lumbar  cord is normal.   IMPRESSION:   Multilevel disc degeneration. Annular bulge at L4-L5 along with facetal  hypertrophy and hypertrophy of the ligamentum flavum results in prominent  flattening of the lateral recesses at this level. Similar findings are  also  present at L1-L2. Lumbar vertebra numbered with the lowest lumbar-shaped  segmented vertebra as L5.Other findings as above.   Thank you for the opportunity to contribute to the care of your patient.       Lumbar MR w/wo contrast:  Results for orders placed in visit on 01/21/11  MR Lumbar Spine W Wo Contrast   Narrative * PRIOR REPORT IMPORTED FROM AN EXTERNAL SYSTEM *   PRIOR REPORT IMPORTED FROM THE [facility] WORKFLOW SYSTEM   REASON FOR EXAM:  COMMENTS:   PROCEDURE:     MR  - MR LUMBAR SPINE WO/W  - Jan 21 2011  2:27PM   RESULT:      Comparison: 07/21/2009   Technique: Standard  lumbar spine protocol, with and without 9 mL  Multihance  intravenous contrast. Half dose contrast was administered secondary to the  patient's slightly diminished GFR of 54.   Findings:  There bone marrow endplate changes at W8-G8, L4-L5, and L5-S1. There is  mild  increased T2 signal in the intervertebral disc space of L4-L5 and to a  lesser extent L2-L3. These findings are new from prior. Low T1 signal in  the  intervertebral disc spaces is most consistent with vacuum disc phenomenon.  There is minimal retrolisthesis of L1 on L2. Conus termination is normal.   T12-L1: Mild posterior disc bulge causes flattening of the thecal sac. No  neuroforaminal narrowing.   L1-L2: Mild posterior disc bulge causes flattening of the thecal sac.  There  is mild degenerative facet disease. There is moderate right, and mild  left,  neuroforaminal narrowing.   L2-L3: Mild posterior disc bulge causes flattening of the thecal sac.  There  is mild degenerative facet disease. There is moderate right, and mild  left,  neuroforaminal narrowing.   L3-L4: Mild posterior disc bulge causes flattening of the thecal sac.  There  is mild degenerative facet disease. No neuroforaminal narrowing.   L4-L5: Mild posterior disc bulge causes flattening of the thecal sac.  There  is moderate right, and severe left, neuroforaminal narrowing. There is  mild  degenerative facet disease, with moderate right-sided ligamentum flavum  hypertrophy. These findings are similar to prior. Postsurgical changes  seen  from left hemilaminectomy at this level.   L5-S1: A mild posterior disc bulge causes flattening of ventral thecal  sac.  There is moderate bilateral neuroforaminal narrowing, left greater than  right.   Small T2 hyperintense structure the kidneys are too small to get dressed,  but statistically likely represent cysts.   IMPRESSION:   1. Multilevel degenerative  disc and facet disease, with multilevel   bilateral  neuroforaminal narrowing, as described above a level by level.  2. Left hemilaminectomy at L4-L5.  3. There are bone marrow endplate reactive changes at L4-L5 and L2-L3 with  slight increased T2 signal in the intervertebral disc spaces. While these  findings are likely degenerative, or may be postsurgical at the level of  L4-L5, early changes of discitis osteomyelitis be difficult to exclude.  Recommend clinical correlation. A sedimentation rate could be performed,  as  indicated.   This was called to Merlyn Lot at [phone] hours 01/22/2011.       Lumbar CT wo contrast:  Results for orders placed in visit on 08/23/11  CT Lumbar Spine Wo Contrast   Narrative * PRIOR REPORT IMPORTED FROM AN EXTERNAL SYSTEM *   PRIOR REPORT IMPORTED FROM THE [facility] WORKFLOW SYSTEM   REASON FOR EXAM:    low back pain  COMMENTS:   PROCEDURE:     CT  - CT LUMBAR SPINE WO  - Aug 23 2011 11:20AM   RESULT:     This study was compared to a prior MRI dated 01/21/2011.   Technique: Multiplanar imaging of the lumbar spine was obtained utilizing  helical 2 mm acquisition as well as bone and soft tissue algorithm  reconstruction.   Findings: Multilevel severe degenerative disease changes are identified  within the lumbar spine. Multilevel vacuum discs are also identified.  Endplate hypertrophic spurring is appreciated most severe at the L1-L2  level  and the L5-S1 levels. Multilevel facet arthropathy is again appreciated.  The  spinal canal appears patent without evidence of narrowing secondary to  osseous etiologies. There are areas concerning for ligamentum flavum  hypertrophy at the L1-L2 and L2-L3 levels as well as the L4-L5 level.  Findings are once again appreciated consistent with prior laminectomy at  the  L4-L5 level.   Endplate reactive changes are appreciated at L4-L5, L5-S1 and the L2-L3  and  L1-L2 levels. Again, most severe at the L4-L5 level.   IMPRESSION:   Severe  degenerative changes within the lumbar spine as described above.  There is no CT evidence of appreciable canal stenosis. Postsurgical  changes  are once again identified.   Thank you for the opportunity to contribute to the care of your patient.       Lumbar DG 2-3 views:  Results for orders placed in visit on 02/25/08  DG Lumbar Spine 2-3 Views   Narrative * PRIOR REPORT IMPORTED FROM AN EXTERNAL SYSTEM *   PRIOR REPORT IMPORTED FROM THE [facility] [place] EXAM:    pain, hx breast ca  COMMENTS:   PROCEDURE:     DXR - DXR LUMBAR SPINE AP AND LATERAL  - Feb 26 2008  12:12AM   RESULT:     There does not appear to be evidence of acute fracture or  dislocation. Multilevel degenerative changes are appreciated within the  lumbar spine.  This appears to be most severe at the L1-2 and L5-S1  levels.   IMPRESSION:   1.Degenerative changes within the lumbar spine without evidence of acute  osseous abnormalities.  If there is persistent clinical concern, further  evaluation with lumbar MRI is recommended.  Thank you for the opportunity to contribute to the care of your patient.       Sacroiliac Joint Imaging: Sacroiliac Joint DG:  Results for orders placed during the hospital encounter of 04/20/17  DG Si Joints  Narrative CLINICAL DATA:  Hip pain, no known injury, initial encounter  EXAM: BILATERAL SACROILIAC JOINTS - 3+ VIEW  COMPARISON:  None.  FINDINGS: Degenerative changes are noted at the lumbosacral junction. The sacral ala are within normal limits. No significant sclerosis of the sacroiliac joints is noted. Degenerative changes of the hip joints right greater than left is noted.  IMPRESSION: No acute abnormality seen.   Electronically Signed   By: [name] M.D.   On: 04/20/2017 15:43    Hip Imaging: Hip-R DG 2-3 views:  Results for orders placed during the hospital encounter of 04/20/17  DG HIP UNILAT W OR W/O PELVIS 2-3 VIEWS RIGHT    Narrative CLINICAL DATA:  Bilateral hip pain  EXAM: DG HIP (WITH OR WITHOUT PELVIS) 2-3V RIGHT  COMPARISON:  None.  FINDINGS: Pelvic ring is intact. Degenerative changes of the right hip joint are noted somewhat more marked than that seen on the left side. No acute fracture or dislocation is noted. No soft tissue abnormality is seen.  IMPRESSION: Degenerative change of the right hip.   Electronically Signed   By: [name] M.D.   On: 04/20/2017 15:43    Hip-L DG 2-3 views:  Results for orders placed during the hospital encounter of 04/20/17  DG HIP UNILAT W OR W/O PELVIS 2-3 VIEWS LEFT   Narrative CLINICAL DATA:  Hip pain, initial encounter  EXAM: DG HIP (WITH OR WITHOUT PELVIS) 3V LEFT  COMPARISON:  None.  FINDINGS: Degenerative changes of left hip joint are seen. There somewhat less marked than that seen on the right. No soft tissue abnormality is seen.  IMPRESSION: Degenerative change without acute abnormality.   Electronically Signed   By: [name] M.D.   On: 04/20/2017 15:44    Note: Results of ordered imaging test(s) reviewed and explained to patient in Layman's terms. Copy of results provided to patient  Meds   Current Outpatient Prescriptions:  .  albuterol (PROVENTIL) (2.5 MG/3ML) 0.083% nebulizer solution, Take 3 mLs (2.5 mg total) by nebulization every 8 (eight) hours as needed. Shortness of breath and wheezing, Disp: 75 mL, Rfl: 12 .  apixaban (ELIQUIS) 5 MG TABS tablet, Take 1 tablet (5 mg total) by mouth 2 (two) times daily., Disp: 60 tablet, Rfl: 0 .  bisacodyl (DULCOLAX) 5 MG EC tablet, Take 2 tablets (10 mg total) by mouth daily as needed for moderate constipation., Disp: 60 tablet, Rfl: 2 .  Cholecalciferol (VITAMIN D) 2000 units tablet, Take 2,000 Units by mouth daily. , Disp: , Rfl:  .  furosemide (LASIX) 20 MG tablet, Take 1 tablet (20 mg total) by mouth daily. (Patient taking differently: Take 20 mg by mouth daily. Take 1/2 tablet a  day), Disp: 90 tablet, Rfl: 1 .  levocetirizine (XYZAL) 5 MG tablet, Take 1 tablet (5 mg total) by mouth every evening., Disp: 30 tablet, Rfl: 12 .  lisinopril (PRINIVIL,ZESTRIL) 10 MG tablet, Take 5 mg by mouth daily., Disp: , Rfl:  .  [START ON 06/11/2017] Oxycodone HCl 20 MG TABS, Take 1 tablet (20 mg total) by mouth every 8 (eight) hours as needed., Disp: 90 tablet, Rfl: 0 .  [START ON 07/11/2017] Oxycodone HCl 20 MG TABS, Take 1 tablet (20 mg total) by mouth every 8 (eight) hours as needed., Disp: 90 tablet, Rfl: 0 .  Oxycodone HCl 20 MG TABS, Take 1 tablet (20 mg total) by mouth every 8 (eight) hours as needed., Disp: 90 tablet, Rfl: 0 .  OXYGEN, Inhale  2 L into the lungs at bedtime. , Disp: , Rfl:  .  polyethylene glycol (MIRALAX / GLYCOLAX) packet, Take 17 g by mouth daily as needed for moderate constipation., Disp: 14 each, Rfl: 2 .  rOPINIRole (REQUIP) 2 MG tablet, Take 1 tablet (2 mg total) by mouth at bedtime., Disp: 30 tablet, Rfl: 2  ROS  Constitutional: Denies any fever or chills Gastrointestinal: No reported hemesis, hematochezia, vomiting, or acute GI distress Musculoskeletal: Denies any acute onset joint swelling, redness, loss of ROM, or weakness Neurological: No reported episodes of acute onset apraxia, aphasia, dysarthria, agnosia, amnesia, paralysis, loss of coordination, or loss of consciousness  Allergies  Kristin [name] is allergic to cyclobenzaprine; flexeril [cyclobenzaprine hcl]; and vancomycin.  PFSH  Drug: Kristin [name]  reports that she does not use drugs. Alcohol:  reports that she does not drink alcohol. Tobacco:  reports that she quit smoking about 18 years ago. Her smoking use included Cigarettes. She has a 50.00 pack-year smoking history. She has never used smokeless tobacco. Medical:  has a past medical history of Arrhythmia, sinus node (04/03/2014); Arthritis; Arthritis; Atrial fibrillation ([place]); Back pain; Bradycardia; Breast cancer ([place]) (2004); Cancer [facility])  (2004); CHF (congestive heart failure) ([place]); Chronic back pain; CKD (chronic kidney disease); COPD (chronic obstructive pulmonary disease) ([place]); Cystocele; Decubitus ulcers; Gout; Hematuria; Hepatitis C; Hyperlipidemia; Hypertension; Hypomagnesemia (06/25/2015); OAB (overactive bladder); Overactive bladder; Restless leg; Shoulder pain, bilateral; Urinary frequency; UTI (lower urinary tract infection); and Vaginal atrophy. Surgical: Kristin [name]  has a past surgical history that includes Abdominal hysterectomy; Back surgery; Rotator cuff repair; Breast surgery; Cataract extraction w/PHACO (10/19/2011); Cataract extraction w/PHACO (11/02/2011); Carpal tunnel release; Joint replacement; Colon surgery; Pacemaker insertion (N/A, 12/24/2015); and Cholecystectomy. Family: family history includes Breast cancer in her paternal aunt; Breast cancer (age of onset: 65) in her sister; Cancer in her sister; Heart disease in her father and mother; Kidney disease in her brother; Stroke in her mother.  Constitutional Exam  General appearance: Well nourished, well developed, and well hydrated. In no apparent acute distress Vitals:   05/04/17 1300  BP: 119/71  Pulse: (!) 111  Resp: 16  Temp: 98.1 F (36.7 C)  TempSrc: Oral  SpO2: 100%  Weight: 158 lb (71.7 kg)  Height: '5\' 8"'  (1.727 m)   BMI Assessment: Estimated body mass index is 24.02 kg/m as calculated from the following:   Height as of this encounter: '5\' 8"'  (1.727 m).   Weight as of this encounter: 158 lb (71.7 kg).  BMI interpretation table: BMI level Category Range association with higher incidence of chronic pain  <18 kg/m2 Underweight   18.5-24.9 kg/m2 Ideal body weight   25-29.9 kg/m2 Overweight Increased incidence by 20%  30-34.9 kg/m2 Obese (Class I) Increased incidence by 68%  35-39.9 kg/m2 Severe obesity (Class II) Increased incidence by 136%  >40 kg/m2 Extreme obesity (Class III) Increased incidence by 254%   BMI Readings from Last 4 Encounters:   05/10/17 23.72 kg/m  05/04/17 24.02 kg/m  04/27/17 24.04 kg/m  04/20/17 24.02 kg/m   Wt Readings from Last 4 Encounters:  05/10/17 156 lb (70.8 kg)  05/04/17 158 lb (71.7 kg)  04/27/17 158 lb 2 oz (71.7 kg)  04/20/17 158 lb (71.7 kg)  Psych/Mental status: Alert, oriented x 3 (person, place, & time)       Eyes: PERLA Respiratory: No evidence of acute respiratory distress  Cervical Spine Area Exam  Skin & Axial Inspection: No masses, redness, edema, swelling, or associated skin lesions Alignment:  Symmetrical Functional ROM: Decreased ROM      Stability: No instability detected Muscle Tone/Strength: Functionally intact. No obvious neuro-muscular anomalies detected. Sensory (Neurological): Movement-associated pain Palpation: Complains of area being tender to palpation              Upper Extremity (UE) Exam    Side: Right upper extremity  Side: Left upper extremity  Skin & Extremity Inspection: Skin color, temperature, and hair growth are WNL. No peripheral edema or cyanosis. No masses, redness, swelling, asymmetry, or associated skin lesions. No contractures.  Skin & Extremity Inspection: Skin color, temperature, and hair growth are WNL. No peripheral edema or cyanosis. No masses, redness, swelling, asymmetry, or associated skin lesions. No contractures.  Functional ROM: Decreased ROM for shoulder  Functional ROM: Decreased ROM for shoulder  Muscle Tone/Strength: Functionally intact. No obvious neuro-muscular anomalies detected.  Muscle Tone/Strength: Functionally intact. No obvious neuro-muscular anomalies detected.  Sensory (Neurological): Unimpaired          Sensory (Neurological): Unimpaired          Palpation: No palpable anomalies              Palpation: No palpable anomalies              Specialized Test(s): Deferred         Specialized Test(s): Deferred          Thoracic Spine Area Exam  Skin & Axial Inspection: No masses, redness, or swelling Alignment:  Symmetrical Functional ROM: Unrestricted ROM Stability: No instability detected Muscle Tone/Strength: Functionally intact. No obvious neuro-muscular anomalies detected. Sensory (Neurological): Unimpaired Muscle strength & Tone: No palpable anomalies  Lumbar Spine Area Exam  Skin & Axial Inspection: No masses, redness, or swelling Alignment: Symmetrical Functional ROM: Decreased ROM      Stability: No instability detected Muscle Tone/Strength: Functionally intact. No obvious neuro-muscular anomalies detected. Sensory (Neurological): Movement-associated pain Palpation: Complains of area being tender to palpation       Provocative Tests: Lumbar Hyperextension and rotation test: Positive bilaterally for facet joint pain. Lumbar Lateral bending test: evaluation deferred today       Patrick's Maneuver: evaluation deferred today                    Gait & Posture Assessment  Ambulation: Unassisted Gait: Relatively normal for age and body habitus Posture: WNL   Lower Extremity Exam    Side: Right lower extremity  Side: Left lower extremity  Skin & Extremity Inspection: Skin color, temperature, and hair growth are WNL. No peripheral edema or cyanosis. No masses, redness, swelling, asymmetry, or associated skin lesions. No contractures.  Skin & Extremity Inspection: Skin color, temperature, and hair growth are WNL. No peripheral edema or cyanosis. No masses, redness, swelling, asymmetry, or associated skin lesions. No contractures.  Functional ROM: Unrestricted ROM          Functional ROM: Unrestricted ROM          Muscle Tone/Strength: Functionally intact. No obvious neuro-muscular anomalies detected.  Muscle Tone/Strength: Functionally intact. No obvious neuro-muscular anomalies detected.  Sensory (Neurological): Unimpaired  Sensory (Neurological): Unimpaired  Palpation: No palpable anomalies  Palpation: No palpable anomalies   Assessment  Primary Diagnosis & Pertinent Problem List: The  primary encounter diagnosis was Chronic radicular cervical pain (L). Diagnoses of Chronic neck pain, DDD (degenerative disc disease), cervical, Chronic low back pain (Primary Source of Pain) (Bilateral) (L>R), Chronic lower extremity pain (Secondary Area of Pain) (Bilateral) (L>R), Chronic pain syndrome, Restless  leg syndrome, Long term current use of anticoagulant, Long term prescription opiate use, Opiate use (90 MME/Day), and Opioid-induced constipation (OIC) were also pertinent to this visit.  Status Diagnosis  Worsening Responding Stable 1. Chronic radicular cervical pain (L)   2. Chronic neck pain   3. DDD (degenerative disc disease), cervical   4. Chronic low back pain (Primary Source of Pain) (Bilateral) (L>R)   5. Chronic lower extremity pain (Secondary Area of Pain) (Bilateral) (L>R)   6. Chronic pain syndrome   7. Restless leg syndrome   8. Long term current use of anticoagulant   9. Long term prescription opiate use   10. Opiate use (90 MME/Day)   11. Opioid-induced constipation (OIC)     Problems updated and reviewed during this visit: Problem  Chronic lower extremity pain (Secondary Area of Pain) (Bilateral) (L>R)  Ddd (Degenerative Disc Disease), Cervical  Chronic cervical radicular pain  (Left)  Chronic hip pain (Bilateral)  Chronic Neck Pain  Chronic low back pain (Primary Source of Pain) (Bilateral) (L>R)  Grade 1 Retrolisthesis of L1 over L2  Lumbar facet hypertrophy (L1-2, L2-3, and L4-5)  Lumbar lateral recess stenosis (L1-2 and L4-5)  Epidural Fibrosis  Lumbar foraminal stenosis (Severe Left L4-5)   L1-2: Moderate right, mild left. L2-3: Moderate right, mild left. L4-5: Moderate right, severe left. L5-S1: Moderate (left greater than right).   Lumbar central spinal stenosis  Anticoagulated  S/P Cardiac Pacemaker Procedure   Overview:  Dual chamber, 12/24/2015  Overview:  Dual chamber, 12/24/2015 [employer] DRMRI  Model # [id number]   Hyperlipidemia, Unspecified   New Onset Atrial Flutter (Hcc)  Chemical Diabetes  Seasonal Allergies  Chronic Diastolic Chf (Congestive Heart Failure) (Hcc)  Chronic Obstructive Pulmonary Disease (Hcc)  History of Cardiac Catheterization   Overview:  insginificant CAD, 02/22/02   Benign Essential Htn  Chronic Kidney Disease (Ckd), Stage III (Moderate)  Arrhythmia, Sinus Node (Resolved)   Plan of Care  Pharmacotherapy (Medications Ordered): Meds ordered this encounter  Medications  . Oxycodone HCl 20 MG TABS    Sig: Take 1 tablet (20 mg total) by mouth every 8 (eight) hours as needed.    Dispense:  90 tablet    Refill:  0    Do not add this medication to the electronic "Automatic Refill" notification system. Patient may have prescription filled one day early if pharmacy is closed on scheduled refill date. Do not fill until: 06/11/17 To last until: 07/11/17  . Oxycodone HCl 20 MG TABS    Sig: Take 1 tablet (20 mg total) by mouth every 8 (eight) hours as needed.    Dispense:  90 tablet    Refill:  0    Do not add this medication to the electronic "Automatic Refill" notification system. Patient may have prescription filled one day early if pharmacy is closed on scheduled refill date. Do not fill until: 07/11/17 To last until: 08/10/17  . Oxycodone HCl 20 MG TABS    Sig: Take 1 tablet (20 mg total) by mouth every 8 (eight) hours as needed.    Dispense:  90 tablet    Refill:  0    Do not add this medication to the electronic "Automatic Refill" notification system. Patient may have prescription filled one day early if pharmacy is closed on scheduled refill date. Do not fill until: 05/12/17 To last until: 06/11/17  . rOPINIRole (REQUIP) 2 MG tablet    Sig: Take 1 tablet (2 mg total) by mouth at bedtime.  Dispense:  30 tablet    Refill:  2    Do not place this medication, or any other prescription from our practice, on "Automatic Refill". Patient may have prescription filled one day early if pharmacy is closed  on scheduled refill date.  . bisacodyl (DULCOLAX) 5 MG EC tablet    Sig: Take 2 tablets (10 mg total) by mouth daily as needed for moderate constipation.    Dispense:  60 tablet    Refill:  2    Do not place this medication, or any other prescription from our practice, on "Automatic Refill". Patient may have prescription filled one day early if pharmacy is closed on scheduled refill date.  . polyethylene glycol (MIRALAX / GLYCOLAX) packet    Sig: Take 17 g by mouth daily as needed for moderate constipation.    Dispense:  14 each    Refill:  2    Do not place medication on "Automatic Refill". Fill one day early if pharmacy is closed on scheduled refill date.   New Prescriptions   No medications on file   Medications administered today: Kristin [name] had no medications administered during this visit.   Procedure Orders     Cervical Epidural Injection Lab Orders  No laboratory test(s) ordered today   Imaging Orders  No imaging studies ordered today   Referral Orders  No referral(s) requested today   Interventional management options: Planned, scheduled, and/or pending:   Diagnostic left-sided cervical epidural steroid injection under fluoroscopic guidance and IV sedation Pending therapeutic right-sided suprascapular nerve block under fluoroscopic guidance and IV sedation Pending therapeutic right-sided lumbar facet RFA    Considering:   Diagnostic bilateral sacroiliac joint block  Possible bilateral sacroiliac joint RFA  Diagnostic bilateral intra-articular hip joint injection  Diagnostic bilateral femoral nerve + obturator nerve block  Possible bilateral femoral nerve + obturator nerve RFA  Possible bilateral lumbar facet radiofrequency. (Left side done on 12/13/2016) Palliative bilateral lumbar facet block  Diagnostic right intra-articular shoulder joint injection Diagnostic right suprascapular nerve block Possible right suprascapular radiofrequencyablation.     Palliative PRN treatment(s):   Palliative bilateral lumbar facet block Diagnostic right intra-articular shoulder joint injection  Diagnostic right suprascapular nerve block Possible right suprascapular radiofrequencyablation.   Provider-requested follow-up: Return for Procedure (with sedation): (L) CESI.  Future Appointments Date Time Provider [place]  05/24/2017 1:45 PM Vevelyn Francois, NP [facility] None  08/09/2017 12:45 PM Vevelyn Francois, NP [facility] None  09/21/2017 10:40 AM Mikey College, NP [facility] None  10/25/2017 11:00 AM Alisa Graff, FNP [facility] None   Primary Care Physician: Mikey College, NP Location: [facility] Note by: Gaspar Cola, MD Date: 05/04/2017; Time: 4:01 PM

## 2017-05-04 NOTE — Patient Instructions (Addendum)
____________________________________________________________________________________________  Preparing for Procedure with Sedation Instructions: . Oral Intake: Do not eat or drink anything for at least 8 hours prior to your procedure. . Transportation: Public transportation is not allowed. Bring an adult driver. The driver must be physically present in our waiting room before any procedure can be started. Marland Kitchen Physical Assistance: Bring an adult physically capable of assisting you, in the event you need help. This adult should keep you company at home for at least 6 hours after the procedure. . Blood Pressure Medicine: Take your blood pressure medicine with a sip of water the morning of the procedure. . Blood thinners:  . Diabetics on insulin: Notify the staff so that you can be scheduled 1st case in the morning. If your diabetes requires high dose insulin, take only  of your normal insulin dose the morning of the procedure and notify the staff that you have done so. . Preventing infections: Shower with an antibacterial soap the morning of your procedure. . Build-up your immune system: Take 1000 mg of Vitamin C with every meal (3 times a day) the day prior to your procedure. Marland Kitchen Antibiotics: Inform the staff if you have a condition or reason that requires you to take antibiotics before dental procedures. . Pregnancy: If you are pregnant, call and cancel the procedure. . Sickness: If you have a cold, fever, or any active infections, call and cancel the procedure. . Arrival: You must be in the facility at least 30 minutes prior to your scheduled procedure. . Children: Do not bring children with you. . Dress appropriately: Bring dark clothing that you would not mind if they get stained. . Valuables: Do not bring any jewelry or valuables. Procedure appointments are reserved for interventional treatments only. Marland Kitchen No Prescription Refills. . No medication changes will be discussed during procedure  appointments. . No disability issues will be discussed. ____________________________________________________________________________________________   Epidural Steroid Injection An epidural steroid injection is a shot of steroid medicine and numbing medicine that is given into the space between the spinal cord and the bones in your back (epidural space). The shot helps relieve pain caused by an irritated or swollen nerve root. The amount of pain relief you get from the injection depends on what is causing the nerve to be swollen and irritated, and how long your pain lasts. You are more likely to benefit from this injection if your pain is strong and comes on suddenly rather than if you have had pain for a long time. Tell a health care provider about:  Any allergies you have.  All medicines you are taking, including vitamins, herbs, eye drops, creams, and over-the-counter medicines.  Any problems you or family members have had with anesthetic medicines.  Any blood disorders you have.  Any surgeries you have had.  Any medical conditions you have.  Whether you are pregnant or may be pregnant. What are the risks? Generally, this is a safe procedure. However, problems may occur, including:  Headache.  Bleeding.  Infection.  Allergic reaction to medicines.  Damage to your nerves.  What happens before the procedure? Staying hydrated Follow instructions from your health care provider about hydration, which may include:  Up to 2 hours before the procedure - you may continue to drink clear liquids, such as water, clear fruit juice, black coffee, and plain tea.  Eating and drinking restrictions Follow instructions from your health care provider about eating and drinking, which may include:  8 hours before the procedure - stop eating heavy  meals or foods such as meat, fried foods, or fatty foods.  6 hours before the procedure - stop eating light meals or foods, such as toast or  cereal.  6 hours before the procedure - stop drinking milk or drinks that contain milk.  2 hours before the procedure - stop drinking clear liquids.  Medicine  You may be given medicines to lower anxiety.  Ask your health care provider about: ? Changing or stopping your regular medicines. This is especially important if you are taking diabetes medicines or blood thinners. ? Taking medicines such as aspirin and ibuprofen. These medicines can thin your blood. Do not take these medicines before your procedure if your health care provider instructs you not to. General instructions  Plan to have someone take you home from the hospital or clinic. What happens during the procedure?  You may receive a medicine to help you relax (sedative).  You will be asked to lie on your abdomen.  The injection site will be cleaned.  A numbing medicine (local anesthetic) will be used to numb the injection site.  A needle will be inserted through your skin into the epidural space. You may feel some discomfort when this happens. An X-ray machine will be used to make sure the needle is put as close as possible to the affected nerve.  A steroid medicine and a local anesthetic will be injected into the epidural space.  The needle will be removed.  A bandage (dressing) will be put over the injection site. What happens after the procedure?  Your blood pressure, heart rate, breathing rate, and blood oxygen level will be monitored until the medicines you were given have worn off.  Your arm or leg may feel weak or numb for a few hours.  The injection site may feel sore.  Do not drive for 24 hours if you received a sedative. This information is not intended to replace advice given to you by your health care provider. Make sure you discuss any questions you have with your health care provider. Document Released: 11/16/2007 Document Revised: 01/21/2016 Document Reviewed: 11/25/2015 Elsevier Interactive  Patient Education  2017 [employer].

## 2017-05-10 ENCOUNTER — Ambulatory Visit
Admission: RE | Admit: 2017-05-10 | Discharge: 2017-05-10 | Disposition: A | Discharge status: Home / Self Care | Payer: Medicare Other | Source: Ambulatory Visit | Attending: Pain Medicine | Admitting: Pain Medicine

## 2017-05-10 ENCOUNTER — Encounter: Payer: Self-pay | Admitting: Pain Medicine

## 2017-05-10 ENCOUNTER — Ambulatory Visit (HOSPITAL_BASED_OUTPATIENT_CLINIC_OR_DEPARTMENT_OTHER): Payer: Medicare Other | Admitting: Pain Medicine

## 2017-05-10 VITALS — BP 138/80 | HR 80 | Temp 97.6°F | Resp 13 | Ht 68.0 in | Wt 156.0 lb

## 2017-05-10 DIAGNOSIS — M25511 Pain in right shoulder: Secondary | ICD-10-CM | POA: Insufficient documentation

## 2017-05-10 DIAGNOSIS — X58XXXA Exposure to other specified factors, initial encounter: Secondary | ICD-10-CM | POA: Diagnosis not present

## 2017-05-10 DIAGNOSIS — G8929 Other chronic pain: Secondary | ICD-10-CM | POA: Insufficient documentation

## 2017-05-10 DIAGNOSIS — M25512 Pain in left shoulder: Secondary | ICD-10-CM | POA: Diagnosis not present

## 2017-05-10 DIAGNOSIS — Z96611 Presence of right artificial shoulder joint: Secondary | ICD-10-CM

## 2017-05-10 DIAGNOSIS — T8484XA Pain due to internal orthopedic prosthetic devices, implants and grafts, initial encounter: Secondary | ICD-10-CM | POA: Insufficient documentation

## 2017-05-10 MED ORDER — LIDOCAINE HCL 2 % IJ SOLN
20.0000 mL | Freq: Once | INTRAMUSCULAR | Status: AC
  Administered 2017-05-10: 400 mg

## 2017-05-10 MED ORDER — SODIUM CHLORIDE 0.9 % IV SOLN: 1000.0000 mL | Freq: Once | INTRAVENOUS | Status: DC

## 2017-05-10 MED ORDER — MIDAZOLAM HCL 5 MG/5ML IJ SOLN
1.0000 mg | INTRAMUSCULAR | Status: DC | PRN
  Administered 2017-05-10: 2 mg via INTRAVENOUS
  Filled 2017-05-10: qty 5

## 2017-05-10 MED ORDER — FENTANYL CITRATE (PF) 100 MCG/2ML IJ SOLN
25.0000 ug | INTRAMUSCULAR | Status: DC | PRN
  Administered 2017-05-10: 50 ug via INTRAVENOUS
  Filled 2017-05-10: qty 2

## 2017-05-10 MED ORDER — LIDOCAINE HCL (PF) 1.5 % IJ SOLN
20.0000 mL | Freq: Once | INTRAMUSCULAR | Status: DC
  Filled 2017-05-10: qty 20

## 2017-05-10 MED ORDER — LACTATED RINGERS IV SOLN
1000.0000 mL | Freq: Once | INTRAVENOUS | Status: AC
  Administered 2017-05-10: 1000 mL via INTRAVENOUS

## 2017-05-10 MED ORDER — ROPIVACAINE HCL 2 MG/ML IJ SOLN
9.0000 mL | Freq: Once | INTRAMUSCULAR | Status: AC
Start: 2017-05-10 — End: 2017-05-10
  Administered 2017-05-10: 10 mL
  Filled 2017-05-10: qty 10

## 2017-05-10 MED ORDER — METHYLPREDNISOLONE ACETATE 80 MG/ML IJ SUSP
80.0000 mg | Freq: Once | INTRAMUSCULAR | Status: AC
  Administered 2017-05-10: 80 mg
  Filled 2017-05-10: qty 1

## 2017-05-10 NOTE — Patient Instructions (Addendum)
____________________________________________________________________________________________  Post-Procedure instructions Instructions:  Apply ice: Fill a plastic sandwich bag with crushed ice. Cover it with a small towel and apply to injection site. Apply for 15 minutes then remove x 15 minutes. Repeat sequence on day of procedure, until you go to bed. The purpose is to minimize swelling and discomfort after procedure.  Apply heat: Apply heat to procedure site starting the day following the procedure. The purpose is to treat any soreness and discomfort from the procedure.  Food intake: Start with clear liquids (like water) and advance to regular food, as tolerated.   Physical activities: Keep activities to a minimum for the first 8 hours after the procedure.   Driving: If you have received any sedation, you are not allowed to drive for 24 hours after your procedure.  Blood thinner: Restart your blood thinner 6 hours after your procedure. (Only for those taking blood thinners)  Insulin: As soon as you can eat, you may resume your normal dosing schedule. (Only for those taking insulin)  Infection prevention: Keep procedure site clean and dry.  Post-procedure Pain Diary: Extremely important that this be done correctly and accurately. Recorded information will be used to determine the next step in treatment.  Pain evaluated is that of treated area only. Do not include pain from an untreated area.  Complete every hour, on the hour, for the initial 8 hours. Set an alarm to help you do this part accurately.  Do not go to sleep and have it completed later. It will not be accurate.  Follow-up appointment: Keep your follow-up appointment after the procedure. Usually 2 weeks for most procedures. (6 weeks in the case of radiofrequency.) Bring you pain diary.  Expect:  From numbing medicine (AKA: Local Anesthetics): Numbness or decrease in pain.  Onset: Full effect within 15 minutes of  injected.  Duration: It will depend on the type of local anesthetic used. On the average, 1 to 8 hours.   From steroids: Decrease in swelling or inflammation. Once inflammation is improved, relief of the pain will follow.  Onset of benefits: Depends on the amount of swelling present. The more swelling, the longer it will take for the benefits to be seen. In some cases, up to 10 days.  Duration: Steroids will stay in the system x 2 weeks. Duration of benefits will depend on multiple posibilities including persistent irritating factors.  From procedure: Some discomfort is to be expected once the numbing medicine wears off. This should be minimal if ice and heat are applied as instructed. Call if:  You experience numbness and weakness that gets worse with time, as opposed to wearing off.  New onset bowel or bladder incontinence. (Spinal procedures only)  Emergency Numbers:  Durning business hours (Monday - Thursday, 8:00 AM - 4:00 PM) (Friday, 9:00 AM - 12:00 Noon): [phone]  After hours: [phone] ____________________________________________________________________________________________   Post-Procedure Pain Diary   Name: Date of Service Procedure      Time Period Pain Score Painful Area  Pre-procedure ____/10   Time Period Pain Score Area improved. Area not improved.  15 to 30 min post-procedure ____/10    1st hour after procedure ____/10    2nd hour after procedure ____/10    3rd hour after procedure ____/10    4th hour after procedure ____/10    5th hour after procedure ____/10    6th hour after procedure ____/10    7th hour after procedure ____/10    Time Period Pain Score Area  improved. Area not improved.  Note: From here on, always document your pain score 1st thing in the morning.  1st day after procedure ____/10    2nd day after procedure ____/10    3rd day after procedure ____/10    4th day after procedure ____/10    5th day after procedure ____/10     Time Period Pain Score Area improved. Area not improved.  10th day after procedure ____/10    20th day after procedure ____/10     Benefits Indicate for each set of activities if the procedure changes your ability to accomplish them.  Activity Worse No-Change Improved  Dressing, eating, walking, toileting, hygiene     Shopping, housekeeping, food preparation, community transportation     Range of motion of affected area      Your opinion Please indicate which statement best describes your impression of this treatment.  Statement (X)  Based on the results, I am encouraged.   Based on the results, I am disappointed.   I am not sure I have an opinion at this point.    Note: Make sure to complete and return this form to your physician, on your follow-up appointment. This information will be used to interpret the results. Failure to accurately complete, or to return this information, may result in less than optimal outcomes.

## 2017-05-10 NOTE — Progress Notes (Signed)
Patient's Name: Kristin [name]  MRN: [id number]  Referring Provider: Mikey College, *  DOB: 11-07-1929  PCP: Mikey College, NP  DOS: 05/10/2017  Note by: Gaspar Cola, MD  Service setting: Ambulatory outpatient  Specialty: Interventional Pain Management  Patient type: Established  Location: [facility] (AMB) Pain Management Facility  Visit type: Interventional Procedure   Primary Reason for Visit: Interventional Pain Management Treatment. CC: Back Pain (lower  more on the left) and Shoulder Pain (right)  Procedure:  Anesthesia, Analgesia, Anxiolysis:  Type: Diagnostic Suprascapular nerve Block Region: Posterior Shoulder & Scapular Areas Level: Superior to the scapular spine, in the lateral aspect of the supraspinatus fossa (Suprescapular notch). Laterality: Right-Side  Type: Local Anesthesia with Moderate (Conscious) Sedation Local Anesthetic: Lidocaine 1% Route: Intravenous (IV) IV Access: Secured Sedation: Meaningful verbal contact was maintained at all times during the procedure  Indication(s): Analgesia and Anxiety  Indications: 1. Shoulder pain S/P prostheses (Right)   2. Chronic shoulder pain (S/P replacement) (Right)   3. Chronic shoulder pain (Bilateral)    Pain Score: Pre-procedure: 5 /10 Post-procedure: 0-No pain (0)/10  Pre-op Assessment:  Kristin [name] is a 83 y.o. (year old), female patient, seen today for interventional treatment. She  has a past surgical history that includes Abdominal hysterectomy; Back surgery; Rotator cuff repair; Breast surgery; Cataract extraction w/PHACO (10/19/2011); Cataract extraction w/PHACO (11/02/2011); Carpal tunnel release; Joint replacement; Colon surgery; Pacemaker insertion (N/A, 12/24/2015); and Cholecystectomy. Kristin [name] has a current medication list which includes the following prescription(s): albuterol, apixaban, bisacodyl, vitamin d, furosemide, levocetirizine, lisinopril, oxycodone hcl, oxycodone hcl, oxycodone hcl,  oxygen-helium, polyethylene glycol, and ropinirole, and the following Facility-Administered Medications: fentanyl, lactated ringers, and midazolam. Her primarily concern today is the Back Pain (lower  more on the left) and Shoulder Pain (right)  Initial Vital Signs: Blood pressure 116/76, pulse 80, temperature 97.9 F (36.6 C), temperature source Oral, resp. rate 16, height 5\' 8"  (1.727 m), weight 156 lb (70.8 kg), SpO2 95 %. BMI: Estimated body mass index is 23.72 kg/m as calculated from the following:   Height as of this encounter: 5\' 8"  (1.727 m).   Weight as of this encounter: 156 lb (70.8 kg).  Risk Assessment: Allergies: Reviewed. She is allergic to cyclobenzaprine; flexeril [cyclobenzaprine hcl]; and vancomycin.  Allergy Precautions: None required Coagulopathies: Reviewed. None identified.  Blood-thinner therapy: None at this time Active Infection(s): Reviewed. None identified. Kristin [name] is afebrile  Site Confirmation: Kristin [name] was asked to confirm the procedure and laterality before marking the site Procedure checklist: Completed Consent: Before the procedure and under the influence of no sedative(s), amnesic(s), or anxiolytics, the patient was informed of the treatment options, risks and possible complications. To fulfill our ethical and legal obligations, as recommended by the American Medical Association's Code of Ethics, I have informed the patient of my clinical impression; the nature and purpose of the treatment or procedure; the risks, benefits, and possible complications of the intervention; the alternatives, including doing nothing; the risk(s) and benefit(s) of the alternative treatment(s) or procedure(s); and the risk(s) and benefit(s) of doing nothing. The patient was provided information about the general risks and possible complications associated with the procedure. These may include, but are not limited to: failure to achieve desired goals, infection, bleeding, organ or  nerve damage, allergic reactions, paralysis, and death. In addition, the patient was informed of those risks and complications associated to the procedure, such as failure to decrease pain; infection; bleeding; organ or nerve damage with subsequent damage  to sensory, motor, and/or autonomic systems, resulting in permanent pain, numbness, and/or weakness of one or several areas of the body; allergic reactions; (i.e.: anaphylactic reaction); and/or death. Furthermore, the patient was informed of those risks and complications associated with the medications. These include, but are not limited to: allergic reactions (i.e.: anaphylactic or anaphylactoid reaction(s)); adrenal axis suppression; blood sugar elevation that in diabetics may result in ketoacidosis or comma; water retention that in patients with history of congestive heart failure may result in shortness of breath, pulmonary edema, and decompensation with resultant heart failure; weight gain; swelling or edema; medication-induced neural toxicity; particulate matter embolism and blood vessel occlusion with resultant organ, and/or nervous system infarction; and/or aseptic necrosis of one or more joints. Finally, the patient was informed that Medicine is not an exact science; therefore, there is also the possibility of unforeseen or unpredictable risks and/or possible complications that may result in a catastrophic outcome. The patient indicated having understood very clearly. We have given the patient no guarantees and we have made no promises. Enough time was given to the patient to ask questions, all of which were answered to the patient's satisfaction. [name] has indicated that she wanted to continue with the procedure. Attestation: I, the ordering provider, attest that I have discussed with the patient the benefits, risks, side-effects, alternatives, likelihood of achieving goals, and potential problems during recovery for the procedure that I have  provided informed consent. Date: 05/10/2017; Time: 1:28 PM  Pre-Procedure Preparation:  Monitoring: As per clinic protocol. Respiration, ETCO2, SpO2, BP, heart rate and rhythm monitor placed and checked for adequate function Safety Precautions: Patient was assessed for positional comfort and pressure points before starting the procedure. Time-out: I initiated and conducted the "Time-out" before starting the procedure, as per protocol. The patient was asked to participate by confirming the accuracy of the "Time Out" information. Verification of the correct person, site, and procedure were performed and confirmed by me, the nursing staff, and the patient. "Time-out" conducted as per Joint Commission's Universal Protocol (UP.01.01.01). "Time-out" Date & Time: 05/10/2017; 1357 hrs.  Description of Procedure Process:   Position: Prone Target Area: Suprascapular notch. Approach: Posterior approach. Area Prepped: Entire shoulder Area Prepping solution: ChloraPrep (2% chlorhexidine gluconate and 70% isopropyl alcohol) Safety Precautions: Aspiration looking for blood return was conducted prior to all injections. At no point did we inject any substances, as a needle was being advanced. No attempts were made at seeking any paresthesias. Safe injection practices and needle disposal techniques used. Medications properly checked for expiration dates. SDV (single dose vial) medications used. Description of the Procedure: Protocol guidelines were followed. The patient was placed in position over the procedure table. The target area was identified and the area prepped in the usual manner. Skin & deeper tissues infiltrated with local anesthetic. Appropriate amount of time allowed to pass for local anesthetics to take effect. The procedure needles were then advanced to the target area. Proper needle placement secured. Negative aspiration confirmed. Solution injected in intermittent fashion, asking for systemic symptoms  every 0.5cc of injectate. The needles were then removed and the area cleansed, making sure to leave some of the prepping solution back to take advantage of its long term bactericidal properties. Vitals:   05/10/17 1412 05/10/17 1417 05/10/17 1422 05/10/17 1431  BP: 119/78  134/78 138/80  Pulse:      Resp: (!) 23 18 12 13   Temp: 97.8 F (36.6 C)  97.6 F (36.4 C)   TempSrc: Temporal  SpO2: (!) 86% 92%  94%  Weight:      Height:        Start Time: 1357 hrs. End Time: 1402 hrs. Materials:  Needle(s) Type: Regular needle Gauge: 22G Length: 3.5-in Medication(s): We administered lactated ringers, midazolam, fentaNYL, methylPREDNISolone acetate, ropivacaine (PF) 2 mg/mL (0.2%), and lidocaine. Please see chart orders for dosing details.  Imaging Guidance (Non-Spinal):  Type of Imaging Technique: Fluoroscopy Guidance (Non-Spinal) Indication(s): Assistance in needle guidance and placement for procedures requiring needle placement in or near specific anatomical locations not easily accessible without such assistance. Exposure Time: Please see nurses notes. Contrast: Before injecting any contrast, we confirmed that the patient did not have an allergy to iodine, shellfish, or radiological contrast. Once satisfactory needle placement was completed at the desired level, radiological contrast was injected. Contrast injected under live fluoroscopy. No contrast complications. See chart for type and volume of contrast used. Fluoroscopic Guidance: I was personally present during the use of fluoroscopy. "Tunnel Vision Technique" used to obtain the best possible view of the target area. Parallax error corrected before commencing the procedure. "Direction-depth-direction" technique used to introduce the needle under continuous pulsed fluoroscopy. Once target was reached, antero-posterior, oblique, and lateral fluoroscopic projection used confirm needle placement in all planes. Images permanently stored in  EMR. Interpretation: I personally interpreted the imaging intraoperatively. Adequate needle placement confirmed in multiple planes. Appropriate spread of contrast into desired area was observed. No evidence of afferent or efferent intravascular uptake. Permanent images saved into the patient's record.  Antibiotic Prophylaxis:  Indication(s): None identified Antibiotic given: None  Post-operative Assessment:  EBL: None Complications: No immediate post-treatment complications observed by team, or reported by patient. Note: The patient tolerated the entire procedure well. A repeat set of vitals were taken after the procedure and the patient was kept under observation following institutional policy, for this type of procedure. Post-procedural neurological assessment was performed, showing return to baseline, prior to discharge. The patient was provided with post-procedure discharge instructions, including a section on how to identify potential problems. Should any problems arise concerning this procedure, the patient was given instructions to immediately contact us, at any time, without hesitation. In any case, we plan to contact the patient by telephone for a follow-up status report regarding this interventional procedure. Comments:  No additional relevant information.  Plan of Care  Disposition: Discharge home  Discharge Date & Time: 05/10/2017; 1432 hrs.   Physician-requested Follow-up:  Return for post-procedure eval by Crystal, in 2 weeks.  Future Appointments Date Time Provider [place]  05/11/2017 12:00 PM Barbaraann Faster, RN [facility] None  05/24/2017 1:45 PM Vevelyn Francois, NP [facility] None  08/09/2017 12:45 PM Vevelyn Francois, NP [facility] None  09/21/2017 10:40 AM Mikey College, NP [facility] None  10/25/2017 11:00 AM Alisa Graff, FNP [facility] None   Possible POC:  If the patient gets good relief of the pain with the right suprascapular nerve block, we will  consider radiofrequency to extent on that relief.     Imaging Orders     DG C-Arm 1-60 Min-No Report  Procedure Orders     SUPRASCAPULAR NERVE BLOCK  Medications ordered for procedure: Meds ordered this encounter  Medications  . DISCONTD: 0.9 %  sodium chloride infusion  . lactated ringers infusion 1,000 mL  . midazolam (VERSED) 5 MG/5ML injection 1-2 mg    Make sure Flumazenil is available in the pyxis when using this medication. If oversedation occurs, administer 0.2 mg IV over 15  sec. If after 45 sec no response, administer 0.2 mg again over 1 min; may repeat at 1 min intervals; not to exceed 4 doses (1 mg)  . fentaNYL (SUBLIMAZE) injection 25-50 mcg    Make sure Narcan is available in the pyxis when using this medication. In the event of respiratory depression (RR< 8/min): Titrate NARCAN (naloxone) in increments of 0.1 to 0.2 mg IV at 2-3 minute intervals, until desired degree of reversal.  . DISCONTD: lidocaine 1.5 % injection 20 mL    From block tray.  . methylPREDNISolone acetate (DEPO-MEDROL) injection 80 mg  . ropivacaine (PF) 2 mg/mL (0.2%) (NAROPIN) injection 9 mL  . lidocaine (XYLOCAINE) 2 % (with pres) injection 400 mg   Medications administered: We administered lactated ringers, midazolam, fentaNYL, methylPREDNISolone acetate, ropivacaine (PF) 2 mg/mL (0.2%), and lidocaine.  See the medical record for exact dosing, route, and time of administration.  New Prescriptions   No medications on file   Primary Care Physician: Mikey College, NP Location: [facility] Note by: Gaspar Cola, MD Date: 05/10/2017; Time: 2:49 PM  Disclaimer:  Medicine is not an Chief Strategy Officer. The only guarantee in medicine is that nothing is guaranteed. It is important to note that the decision to proceed with this intervention was based on the information collected from the patient. The Data and conclusions were drawn from the patient's questionnaire,  the interview, and the physical examination. Because the information was provided in large part by the patient, it cannot be guaranteed that it has not been purposely or unconsciously manipulated. Every effort has been made to obtain as much relevant data as possible for this evaluation. It is important to note that the conclusions that lead to this procedure are derived in large part from the available data. Always take into account that the treatment will also be dependent on availability of resources and existing treatment guidelines, considered by other Pain Management Practitioners as being common knowledge and practice, at the time of the intervention. For Medico-Legal purposes, it is also important to point out that variation in procedural techniques and pharmacological choices are the acceptable norm. The indications, contraindications, technique, and results of the above procedure should only be interpreted and judged by a Board-Certified Interventional Pain Specialist with extensive familiarity and expertise in the same exact procedure and technique.

## 2017-05-10 NOTE — Progress Notes (Signed)
Safety precautions to be maintained throughout the outpatient stay will include: orient to surroundings, keep bed in low position, maintain call bell within reach at all times, provide assistance with transfer out of bed and ambulation.  

## 2017-05-11 ENCOUNTER — Telehealth: Payer: Self-pay | Admitting: *Deleted

## 2017-05-11 ENCOUNTER — Other Ambulatory Visit: Payer: Self-pay | Admitting: *Deleted

## 2017-05-11 NOTE — Telephone Encounter (Signed)
Voicemail left for patient to call our office if there are questions or concerns re; procedure on yesterday.  

## 2017-05-13 ENCOUNTER — Encounter: Payer: Self-pay | Admitting: Pain Medicine

## 2017-05-13 DIAGNOSIS — M79605 Pain in left leg: Secondary | ICD-10-CM

## 2017-05-13 DIAGNOSIS — G8929 Other chronic pain: Secondary | ICD-10-CM | POA: Insufficient documentation

## 2017-05-13 DIAGNOSIS — M5030 Other cervical disc degeneration, unspecified cervical region: Secondary | ICD-10-CM | POA: Insufficient documentation

## 2017-05-13 DIAGNOSIS — M79604 Pain in right leg: Secondary | ICD-10-CM

## 2017-05-24 ENCOUNTER — Encounter: Payer: Self-pay | Admitting: Nurse Practitioner

## 2017-05-24 ENCOUNTER — Ambulatory Visit: Payer: Medicare Other | Attending: Nurse Practitioner | Admitting: Nurse Practitioner

## 2017-05-24 VITALS — BP 134/83 | HR 72 | Temp 97.7°F | Resp 18 | Ht 68.0 in | Wt 156.0 lb

## 2017-05-24 DIAGNOSIS — Z853 Personal history of malignant neoplasm of breast: Secondary | ICD-10-CM | POA: Diagnosis not present

## 2017-05-24 DIAGNOSIS — M5137 Other intervertebral disc degeneration, lumbosacral region: Secondary | ICD-10-CM | POA: Insufficient documentation

## 2017-05-24 DIAGNOSIS — I130 Hypertensive heart and chronic kidney disease with heart failure and stage 1 through stage 4 chronic kidney disease, or unspecified chronic kidney disease: Secondary | ICD-10-CM | POA: Insufficient documentation

## 2017-05-24 DIAGNOSIS — E559 Vitamin D deficiency, unspecified: Secondary | ICD-10-CM | POA: Diagnosis not present

## 2017-05-24 DIAGNOSIS — Z79891 Long term (current) use of opiate analgesic: Secondary | ICD-10-CM | POA: Diagnosis not present

## 2017-05-24 DIAGNOSIS — Z950 Presence of cardiac pacemaker: Secondary | ICD-10-CM | POA: Diagnosis not present

## 2017-05-24 DIAGNOSIS — Z881 Allergy status to other antibiotic agents status: Secondary | ICD-10-CM | POA: Insufficient documentation

## 2017-05-24 DIAGNOSIS — G894 Chronic pain syndrome: Secondary | ICD-10-CM

## 2017-05-24 DIAGNOSIS — E785 Hyperlipidemia, unspecified: Secondary | ICD-10-CM | POA: Diagnosis not present

## 2017-05-24 DIAGNOSIS — Z7901 Long term (current) use of anticoagulants: Secondary | ICD-10-CM | POA: Insufficient documentation

## 2017-05-24 DIAGNOSIS — M5412 Radiculopathy, cervical region: Secondary | ICD-10-CM

## 2017-05-24 DIAGNOSIS — K219 Gastro-esophageal reflux disease without esophagitis: Secondary | ICD-10-CM | POA: Diagnosis not present

## 2017-05-24 DIAGNOSIS — M25511 Pain in right shoulder: Secondary | ICD-10-CM | POA: Diagnosis not present

## 2017-05-24 DIAGNOSIS — M25551 Pain in right hip: Secondary | ICD-10-CM | POA: Insufficient documentation

## 2017-05-24 DIAGNOSIS — Z79899 Other long term (current) drug therapy: Secondary | ICD-10-CM | POA: Insufficient documentation

## 2017-05-24 DIAGNOSIS — I5032 Chronic diastolic (congestive) heart failure: Secondary | ICD-10-CM | POA: Diagnosis not present

## 2017-05-24 DIAGNOSIS — M542 Cervicalgia: Secondary | ICD-10-CM | POA: Diagnosis not present

## 2017-05-24 DIAGNOSIS — G8929 Other chronic pain: Secondary | ICD-10-CM

## 2017-05-24 DIAGNOSIS — Z87891 Personal history of nicotine dependence: Secondary | ICD-10-CM | POA: Diagnosis not present

## 2017-05-24 DIAGNOSIS — M48061 Spinal stenosis, lumbar region without neurogenic claudication: Secondary | ICD-10-CM | POA: Insufficient documentation

## 2017-05-24 DIAGNOSIS — G2581 Restless legs syndrome: Secondary | ICD-10-CM | POA: Diagnosis not present

## 2017-05-24 DIAGNOSIS — J449 Chronic obstructive pulmonary disease, unspecified: Secondary | ICD-10-CM | POA: Diagnosis not present

## 2017-05-24 DIAGNOSIS — M25552 Pain in left hip: Secondary | ICD-10-CM | POA: Diagnosis not present

## 2017-05-24 DIAGNOSIS — N183 Chronic kidney disease, stage 3 (moderate): Secondary | ICD-10-CM | POA: Diagnosis not present

## 2017-05-24 NOTE — Progress Notes (Signed)
Safety precautions to be maintained throughout the outpatient stay will include: orient to surroundings, keep bed in low position, maintain call bell within reach at all times, provide assistance with transfer out of bed and ambulation.  

## 2017-05-24 NOTE — Progress Notes (Signed)
Patient's Name: Kristin [name]  MRN: [id number]  Referring Provider: Mikey College, *  DOB: Sep 06, 1929  PCP: Mikey College, NP  DOS: 05/24/2017  Note by: Vevelyn Francois NP  Service setting: Ambulatory outpatient  Specialty: Interventional Pain Management  Location: [facility] (AMB) Pain Management Facility    Patient type: Established    Primary Reason(s) for Visit: Encounter for prescription drug management & post-procedure evaluation of chronic illness with mild to moderate exacerbation(Level of risk: moderate) CC: Shoulder Pain (right) and Neck Pain (left)  HPI  Kristin [name] is a 83 y.o. year old, female patient, who comes today for a post-procedure evaluation and medication management. She has Chronic kidney disease (CKD), stage III (moderate) (HCC); DDD (degenerative disc disease), lumbosacral; Gout; Benign essential HTN; Lumbar central spinal stenosis; Chronic obstructive pulmonary disease ([place]); Chemical diabetes; History of cardiac catheterization; Seasonal allergies; History of breast cancer; Chronic pain syndrome; Chronic low back pain (Primary Source of Pain) (Bilateral) (L>R); Lumbar facet syndrome (Bilateral) (L>R); Lumbar spondylosis; Chronic lumbar radicular pain (Bilateral) (L>R) (L5 Dermatome); Restless leg syndrome; Myofascial pain; Neurogenic pain; Long term current use of opiate analgesic; Long term prescription opiate use; Opiate use (90 MME/Day); Opioid-induced constipation (OIC); Vitamin D insufficiency; Grade 1 Retrolisthesis of L1 over L2; Lumbar facet hypertrophy (L1-2, L2-3, and L4-5); Lumbar lateral recess stenosis (L1-2 and L4-5); 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); Epidural fibrosis; Lumbar foraminal stenosis (Severe Left L4-5); 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); Osteoporosis; Chronic sacroiliac joint pain (Left); History of methicillin resistant staphylococcus aureus (MRSA); Mitral valve prolapse; New onset atrial flutter  (HCC); GERD (gastroesophageal reflux disease); Chronic shoulder pain (S/P replacement) (Right); Osteoarthritis, multiple sites; Shoulder pain S/P prostheses (Right); Chronic diastolic CHF (congestive heart failure) ([place]); S/P cardiac pacemaker procedure; Anticoagulated; Hyperlipidemia, unspecified; Chronic neck pain; Chronic hip pain (Bilateral); Chronic cervical radicular pain  (Left); Long term current use of anticoagulant; Chronic lower extremity pain (Secondary Area of Pain) (Bilateral) (L>R); and DDD (degenerative disc disease), cervical on her problem list. Her primarily concern today is the Shoulder Pain (right) and Neck Pain (left)  Pain Assessment: Location: Right Shoulder Radiating: neck pain goes into left shoulder Onset: More than a month ago Duration: Chronic pain Quality: Nagging Severity: 4 /10 (self-reported pain score)  Note: Reported level is compatible with observation.                    Effect on ADL:   Timing: Constant Modifying factors: medicatins  Kristin [name] was last seen on 03/01/2017 for a procedure. During today's appointment we reviewed Kristin [name]'s post-procedure results, as well as her outpatient medication regimen.  Further details on both, my assessment(s), as well as the proposed treatment plan, please see below.  Post-Procedure Assessment  05/10/2017 Procedure: Right Suprascapular nerve Block Pre-procedure pain score:  5/10 Post-procedure pain score: 0/10         Influential Factors: BMI: 23.72 kg/m Intra-procedural challenges: None observed.         Assessment challenges: None detected.              Reported side-effects: None.        Post-procedural adverse reactions or complications: None reported         Sedation: Please see nurses note. When no sedatives are used, the analgesic levels obtained are directly associated to the effectiveness of the local anesthetics. However, when sedation is provided, the level of analgesia obtained during the initial 1  hour following the intervention, is  believed to be the result of a combination of factors. These factors may include, but are not limited to: 1. The effectiveness of the local anesthetics used. 2. The effects of the analgesic(s) and/or anxiolytic(s) used. 3. The degree of discomfort experienced by the patient at the time of the procedure. 4. The patients ability and reliability in recalling and recording the events. 5. The presence and influence of possible secondary gains and/or psychosocial factors. Reported result: Relief experienced during the 1st hour after the procedure: 75 % (Ultra-Short Term Relief)            Interpretative annotation: Clinically appropriate result. Analgesia during this period is likely to be Local Anesthetic and/or IV Sedative (Analgesic/Anxiolytic) related.          Effects of local anesthetic: The analgesic effects attained during this period are directly associated to the localized infiltration of local anesthetics and therefore cary significant diagnostic value as to the etiological location, or anatomical origin, of the pain. Expected duration of relief is directly dependent on the pharmacodynamics of the local anesthetic used. Long-acting (4-6 hours) anesthetics used.  Reported result: Relief during the next 4 to 6 hour after the procedure: 75 % (Short-Term Relief)            Interpretative annotation: Clinically appropriate result. Analgesia during this period is likely to be Local Anesthetic-related.          Long-term benefit: Defined as the period of time past the expected duration of local anesthetics (1 hour for short-acting and 4-6 hours for long-acting). With the possible exception of prolonged sympathetic blockade from the local anesthetics, benefits during this period are typically attributed to, or associated with, other factors such as analgesic sensory neuropraxia, antiinflammatory effects, or beneficial biochemical changes provided by agents other than the  local anesthetics.  Reported result: Extended relief following procedure: 0 % (Long-Term Relief)            Interpretative annotation: Clinically appropriate result. Good relief. No permanent benefit expected. Inflammation plays a part in the etiology to the pain.          Current benefits: Defined as reported results that persistent at this point in time.   Analgesia: <25 %            Function: No improvement ROM: No improvement Interpretative annotation: No long-term benefit. Limited therapeutic benefit. Results would suggest persistent aggravating factors.          Interpretation: Results would suggest that repeating the procedure may be necessary,                  Plan:  Please see "Plan of Care" for details.       Laboratory Chemistry  Inflammation Markers (CRP: Acute Phase) (ESR: Chronic Phase) Lab Results  Component Value Date   CRP <0.5 09/24/2015   ESRSEDRATE 12 09/24/2015                 Renal Function Markers Lab Results  Component Value Date   BUN 20 12/24/2016   CREATININE 0.98 12/24/2016   GFRAA 59 (L) 12/24/2016   GFRNONAA 51 (L) 12/24/2016                 Hepatic Function Markers Lab Results  Component Value Date   AST 21 03/02/2016   ALT 10 03/02/2016   ALBUMIN 3.7 03/02/2016   ALKPHOS 74 03/02/2016                 Electrolytes  Lab Results  Component Value Date   NA 139 12/24/2016   K 4.0 12/24/2016   CL 107 12/24/2016   CALCIUM 9.7 12/24/2016   MG 2.2 09/24/2015                 Neuropathy Markers No results found for: [id number]               Bone Pathology Markers Lab Results  Component Value Date   ALKPHOS 74 03/02/2016   CALCIUM 9.7 12/24/2016                 Coagulation Parameters Lab Results  Component Value Date   INR 1.24 12/16/2015   LABPROT 15.8 (H) 12/16/2015   APTT 26 12/16/2015   PLT 185 04/20/2017                 Cardiovascular Markers Lab Results  Component Value Date   HGB 12.6 04/20/2017   HCT 38.0 04/20/2017                  Note: Lab results reviewed.  Recent Diagnostic Imaging Results  DG C-Arm 1-60 Min-No Report Fluoroscopy was utilized by the requesting physician.  No radiographic  interpretation.   Note: Imaging results reviewed.        Meds   Current Outpatient Prescriptions:  .  albuterol (PROVENTIL) (2.5 MG/3ML) 0.083% nebulizer solution, Take 3 mLs (2.5 mg total) by nebulization every 8 (eight) hours as needed. Shortness of breath and wheezing, Disp: 75 mL, Rfl: 12 .  apixaban (ELIQUIS) 5 MG TABS tablet, Take 1 tablet (5 mg total) by mouth 2 (two) times daily., Disp: 60 tablet, Rfl: 0 .  bisacodyl (DULCOLAX) 5 MG EC tablet, Take 2 tablets (10 mg total) by mouth daily as needed for moderate constipation., Disp: 60 tablet, Rfl: 2 .  Cholecalciferol (VITAMIN D) 2000 units tablet, Take 2,000 Units by mouth daily. , Disp: , Rfl:  .  furosemide (LASIX) 20 MG tablet, Take 1 tablet (20 mg total) by mouth daily. (Patient taking differently: Take 20 mg by mouth daily. Take 1/2 tablet a day), Disp: 90 tablet, Rfl: 1 .  levocetirizine (XYZAL) 5 MG tablet, Take 1 tablet (5 mg total) by mouth every evening., Disp: 30 tablet, Rfl: 12 .  lisinopril (PRINIVIL,ZESTRIL) 10 MG tablet, Take 5 mg by mouth daily., Disp: , Rfl:  .  [START ON 06/11/2017] Oxycodone HCl 20 MG TABS, Take 1 tablet (20 mg total) by mouth every 8 (eight) hours as needed., Disp: 90 tablet, Rfl: 0 .  [START ON 07/11/2017] Oxycodone HCl 20 MG TABS, Take 1 tablet (20 mg total) by mouth every 8 (eight) hours as needed., Disp: 90 tablet, Rfl: 0 .  Oxycodone HCl 20 MG TABS, Take 1 tablet (20 mg total) by mouth every 8 (eight) hours as needed., Disp: 90 tablet, Rfl: 0 .  OXYGEN, Inhale 2 L into the lungs at bedtime. , Disp: , Rfl:  .  polyethylene glycol (MIRALAX / GLYCOLAX) packet, Take 17 g by mouth daily as needed for moderate constipation., Disp: 14 each, Rfl: 2 .  rOPINIRole (REQUIP) 2 MG tablet, Take 1 tablet (2 mg total) by mouth at  bedtime., Disp: 30 tablet, Rfl: 2  ROS  Constitutional: Denies any fever or chills Gastrointestinal: No reported hemesis, hematochezia, vomiting, or acute GI distress Musculoskeletal: Denies any acute onset joint swelling, redness, loss of ROM, or weakness Neurological: No reported episodes of acute onset apraxia, aphasia, dysarthria, agnosia, amnesia, paralysis,  loss of coordination, or loss of consciousness  Allergies  Kristin [name] is allergic to flexeril [cyclobenzaprine hcl] and vancomycin.  PFSH  Drug: Kristin [name]  reports that she does not use drugs. Alcohol:  reports that she does not drink alcohol. Tobacco:  reports that she quit smoking about 18 years ago. Her smoking use included Cigarettes. She has a 50.00 pack-year smoking history. She has never used smokeless tobacco. Medical:  has a past medical history of Arrhythmia, sinus node (04/03/2014); Arthritis; Arthritis; Atrial fibrillation ([place]); Back pain; Bradycardia; Breast cancer ([place]) (2004); Cancer [facility]) (2004); CHF (congestive heart failure) ([place]); Chronic back pain; CKD (chronic kidney disease); COPD (chronic obstructive pulmonary disease) ([place]); Cystocele; Decubitus ulcers; Gout; Hematuria; Hepatitis C; Hyperlipidemia; Hypertension; Hypomagnesemia (06/25/2015); OAB (overactive bladder); Overactive bladder; Restless leg; Shoulder pain, bilateral; Urinary frequency; UTI (lower urinary tract infection); and Vaginal atrophy. Surgical: Kristin [name]  has a past surgical history that includes Abdominal hysterectomy; Back surgery; Rotator cuff repair; Breast surgery; Cataract extraction w/PHACO (10/19/2011); Cataract extraction w/PHACO (11/02/2011); Carpal tunnel release; Joint replacement; Colon surgery; Pacemaker insertion (N/A, 12/24/2015); and Cholecystectomy. Family: family history includes Breast cancer in her paternal aunt; Breast cancer (age of onset: 26) in her sister; Cancer in her sister; Heart disease in her father and mother; Kidney  disease in her brother; Stroke in her mother.  Constitutional Exam  General appearance: Well nourished, well developed, and well hydrated. In no apparent acute distress Vitals:   05/24/17 1353  BP: 134/83  Pulse: 72  Resp: 18  Temp: 97.7 F (36.5 C)  TempSrc: Oral  SpO2: 97%  Weight: 156 lb (70.8 kg)  Height: 5' 8" (1.727 m)   BMI Assessment: Estimated body mass index is 23.72 kg/m as calculated from the following:   Height as of this encounter: 5' 8" (1.727 m).   Weight as of this encounter: 156 lb (70.8 kg). Psych/Mental status: Alert, oriented x 3 (person, place, & time)       Eyes: PERLA Respiratory: No evidence of acute respiratory distress  Cervical Spine Area Exam  Skin & Axial Inspection: No masses, redness, edema, swelling, or associated skin lesions Alignment: Symmetrical Functional ROM: Unrestricted ROM      Stability: No instability detected Muscle Tone/Strength: Functionally intact. No obvious neuro-muscular anomalies detected. Sensory (Neurological): Unimpaired Palpation: No palpable anomalies              Upper Extremity (UE) Exam    Side: Right upper extremity  Side: Left upper extremity  Skin & Extremity Inspection: Skin color, temperature, and hair growth are WNL. No peripheral edema or cyanosis. No masses, redness, swelling, asymmetry, or associated skin lesions. No contractures.  Skin & Extremity Inspection: Skin color, temperature, and hair growth are WNL. No peripheral edema or cyanosis. No masses, redness, swelling, asymmetry, or associated skin lesions. No contractures.  Functional ROM: Pain restricted ROM for shoulder  Functional ROM: Unrestricted ROM          Muscle Tone/Strength: Normal strength (5/5)  Muscle Tone/Strength: Normal strength (5/5)  Sensory (Neurological): Unimpaired          Sensory (Neurological): Unimpaired          Palpation: No palpable anomalies              Palpation: No palpable anomalies              Specialized Test(s):  Deferred         Specialized Test(s): Deferred  Thoracic Spine Area Exam  Skin & Axial Inspection: No masses, redness, or swelling Alignment: Symmetrical Functional ROM: Unrestricted ROM Stability: No instability detected Muscle Tone/Strength: Functionally intact. No obvious neuro-muscular anomalies detected. Sensory (Neurological): Unimpaired Muscle strength & Tone: No palpable anomalies  Lumbar Spine Area Exam  Skin & Axial Inspection: No masses, redness, or swelling Alignment: Symmetrical Functional ROM: Unrestricted ROM      Stability: No instability detected Muscle Tone/Strength: Functionally intact. No obvious neuro-muscular anomalies detected. Sensory (Neurological): Unimpaired Palpation: No palpable anomalies       Provocative Tests: Lumbar Hyperextension and rotation test: evaluation deferred today       Lumbar Lateral bending test: evaluation deferred today       Patrick's Maneuver: evaluation deferred today                    Gait & Posture Assessment  Ambulation: Unassisted Gait: Relatively normal for age and body habitus Posture: WNL   Lower Extremity Exam    Side: Right lower extremity  Side: Left lower extremity  Skin & Extremity Inspection: Skin color, temperature, and hair growth are WNL. No peripheral edema or cyanosis. No masses, redness, swelling, asymmetry, or associated skin lesions. No contractures.  Skin & Extremity Inspection: Skin color, temperature, and hair growth are WNL. No peripheral edema or cyanosis. No masses, redness, swelling, asymmetry, or associated skin lesions. No contractures.  Functional ROM: Unrestricted ROM          Functional ROM: Unrestricted ROM          Muscle Tone/Strength: Functionally intact. No obvious neuro-muscular anomalies detected.  Muscle Tone/Strength: Functionally intact. No obvious neuro-muscular anomalies detected.  Sensory (Neurological): Unimpaired  Sensory (Neurological): Unimpaired  Palpation: No palpable  anomalies  Palpation: No palpable anomalies   Assessment  Primary Diagnosis & Pertinent Problem List: The primary encounter diagnosis was Chronic shoulder pain (S/P replacement) (Right). Diagnoses of Chronic neck pain, Chronic cervical radicular pain  (Left), and Chronic pain syndrome were also pertinent to this visit.  Status Diagnosis  Controlled Controlled Controlled 1. Chronic shoulder pain (S/P replacement) (Right)   2. Chronic neck pain   3. Chronic cervical radicular pain  (Left)   4. Chronic pain syndrome     Problems updated and reviewed during this visit: No problems updated. Plan of Care  Pharmacotherapy (Medications Ordered): No orders of the defined types were placed in this encounter.  New Prescriptions   No medications on file   Medications administered today: Kristin [name] had no medications administered during this visit. Lab-work, procedure(s), and/or referral(s): No orders of the defined types were placed in this encounter.  Imaging and/or referral(s): None  Interventional management options: Planned, scheduled, and/or pending:   Not at this time   Considering:   Diagnostic bilateral sacroiliac joint block Possible bilateral sacroiliac joint RFA Diagnostic bilateral intra-articular hip joint injection Diagnostic bilateral femoral nerve + obturator nerve block Possible bilateral femoral nerve + obturator nerve RFA Possible bilateral lumbar facet radiofrequency. (Left side done on 12/13/2016) Palliative bilateral lumbar facet block  Diagnostic right intra-articular shoulder joint injection Diagnostic right suprascapular nerve block Possible right suprascapular radiofrequencyablation.    Palliative PRN treatment(s):   Palliative bilateral lumbar facet block Diagnostic right intra-articular shoulder joint injection  Diagnostic right suprascapular nerve block Possible right suprascapular radiofrequencyablation.   Provider-requested  follow-up: No Follow-up on file.  Future Appointments Date Time Provider [place]  08/09/2017 12:45 PM Vevelyn Francois, NP [facility] None  09/21/2017 10:40 AM Merrilyn Puma,  Jerrel Ivory, NP [facility] None  10/25/2017 11:00 AM Alisa Graff, FNP [facility] None   Primary Care Physician: Mikey College, NP Location: [facility] Outpatient Pain Management Facility Note by: Vevelyn Francois NP Date: 05/24/2017; Time: 3:41 PM  Pain Score Disclaimer: We use the NRS-11 scale. This is a self-reported, subjective measurement of pain severity with only modest accuracy. It is used primarily to identify changes within a particular patient. It must be understood that outpatient pain scales are significantly less accurate that those used for research, where they can be applied under ideal controlled circumstances with minimal exposure to variables. In reality, the score is likely to be a combination of pain intensity and pain affect, where pain affect describes the degree of emotional arousal or changes in action readiness caused by the sensory experience of pain. Factors such as social and work situation, setting, emotional state, anxiety levels, expectation, and prior pain experience may influence pain perception and show large inter-individual differences that may also be affected by time variables.  Patient instructions provided during this appointment: There are no Patient Instructions on file for this visit.

## 2017-06-01 ENCOUNTER — Ambulatory Visit: Payer: Medicare Other | Admitting: Pain Medicine

## 2017-06-15 ENCOUNTER — Other Ambulatory Visit: Payer: Self-pay | Admitting: Nurse Practitioner

## 2017-06-15 ENCOUNTER — Telehealth: Payer: Self-pay | Admitting: *Deleted

## 2017-06-15 DIAGNOSIS — Z1231 Encounter for screening mammogram for malignant neoplasm of breast: Secondary | ICD-10-CM

## 2017-06-15 NOTE — Patient Outreach (Signed)
[facility]) Care Management  06/15/2017  Kristin [name] November 26, 1929 [id number]   Care coordination Contact made from Kristin [name] daughter for assist with [facility] OTC benefit for new BP cuff Kristin [name] is doing well and has met all her [facility] [address] [address] goals and has remind out of the hospital in the last 6 months. Her grand daughter reports she is progressing well  [facility] CM provided [facility] contact number as [phone] and confirmed Kristin [name] should have 72 credits available to use from 05/23/17 to 08/22/17  [facility] CM reminded her that all the resources and contact information needed for Kristin [name] is written in her spiral [facility] 2018 calendar [facility] CM has spoken previously with Kristin [name] on orn contact and gradual progression towards health coach or case closure    [place]. Lavina Hamman, RN, BSN, [place] [phone]

## 2017-07-12 ENCOUNTER — Ambulatory Visit
Admission: RE | Admit: 2017-07-12 | Discharge: 2017-07-12 | Disposition: A | Discharge status: Home / Self Care | Payer: Medicare Other | Source: Ambulatory Visit | Attending: Nurse Practitioner | Admitting: Nurse Practitioner

## 2017-07-12 DIAGNOSIS — N6489 Other specified disorders of breast: Secondary | ICD-10-CM | POA: Diagnosis not present

## 2017-07-12 DIAGNOSIS — Z1231 Encounter for screening mammogram for malignant neoplasm of breast: Secondary | ICD-10-CM | POA: Diagnosis present

## 2017-07-12 DIAGNOSIS — R928 Other abnormal and inconclusive findings on diagnostic imaging of breast: Secondary | ICD-10-CM | POA: Diagnosis not present

## 2017-07-19 ENCOUNTER — Other Ambulatory Visit: Payer: Self-pay | Admitting: Nurse Practitioner

## 2017-07-19 ENCOUNTER — Ambulatory Visit: Payer: Medicare Other | Admitting: Nurse Practitioner

## 2017-07-19 DIAGNOSIS — N6489 Other specified disorders of breast: Secondary | ICD-10-CM

## 2017-07-19 DIAGNOSIS — R928 Other abnormal and inconclusive findings on diagnostic imaging of breast: Secondary | ICD-10-CM

## 2017-07-20 ENCOUNTER — Ambulatory Visit (INDEPENDENT_AMBULATORY_CARE_PROVIDER_SITE_OTHER): Payer: Medicare Other | Admitting: Nurse Practitioner

## 2017-07-20 ENCOUNTER — Encounter: Payer: Self-pay | Admitting: Nurse Practitioner

## 2017-07-20 ENCOUNTER — Other Ambulatory Visit: Payer: Self-pay

## 2017-07-20 VITALS — BP 111/79 | HR 79 | Temp 98.5°F | Ht 68.0 in | Wt 162.8 lb

## 2017-07-20 DIAGNOSIS — I10 Essential (primary) hypertension: Secondary | ICD-10-CM

## 2017-07-20 DIAGNOSIS — J431 Panlobular emphysema: Secondary | ICD-10-CM | POA: Diagnosis not present

## 2017-07-20 DIAGNOSIS — R220 Localized swelling, mass and lump, head: Secondary | ICD-10-CM

## 2017-07-20 NOTE — Progress Notes (Signed)
Subjective:    [name], female    DOB: 1929/12/30, 83 y.o.   MRN: [id number]  Kristin [name] is a 83 y.o. female presenting on 07/20/2017 for Mouth Lesions (inside the mouth on the right side x 3-4 weeks. )   HPI Mouth Lesion Has mouth lesion that first appeared 3-4 weeks ago. States she now has a "knot" remaining that occasionally bleeds and burns w/ listerine and acidic foods. Was initially sore, but soreness has improved.    Hypertension - She is checking BP at home or outside of clinic.  Readings 90-110/70 - Current medications: lisinopril 5 mg once daily, furosemide 10 mg once daily, tolerating well without side effects - She is not currently symptomatic. However, she had to call EMS a few days ago r/t episode of dizziness, blurred vision.  EMS stated she was dehydrated.  Good CBG at that time.  Has not had any repeated episodes. - Pt denies headache, lightheadedness, dizziness, changes in vision, chest tightness/pressure, palpitations, leg swelling, sudden loss of speech or loss of consciousness. - She  reports an exercise routine that includes housework, 3 days per week. - Her diet is moderate in salt, moderate in fat, and moderate in carbohydrates.  COPD Shortness of breath w/ climbing stairs and walking faster.  Has albuterol prn, but has not needed to use recently.   Social History   Tobacco Use  . Smoking status: Former Smoker    Packs/day: 1.25    Years: 40.00    Pack years: 50.00    Types: Cigarettes    Last attempt to quit: 12/16/1998    Years since quitting: 18.6  . Smokeless tobacco: Never Used  Substance Use Topics  . Alcohol use: No    Alcohol/week: 0.0 oz  . Drug use: No    Review of Systems Per HPI unless specifically indicated above     Objective:    BP 111/79 (BP Location: Right Arm, Patient Position: Sitting, Cuff Size: Normal)   Pulse 79   Temp 98.5 F (36.9 C) (Oral)   Ht 5\' 8"  (1.727 m)   Wt 162 lb 12.8 oz (73.8 kg)   BMI  24.75 kg/m   Wt Readings from Last 3 Encounters:  07/20/17 162 lb 12.8 oz (73.8 kg)  05/24/17 156 lb (70.8 kg)  05/10/17 156 lb (70.8 kg)    Physical Exam  Constitutional: She is oriented to person, place, and time. She appears well-developed and well-nourished. No distress.  HENT:  Head: Normocephalic and atraumatic.  Mouth/Throat: Uvula is midline and oropharynx is clear and moist. Mucous membranes are not pale, not dry and not cyanotic.    Eyes: Conjunctivae are normal. Pupils are equal, round, and reactive to light.  Neck: Normal range of motion. Neck supple. No JVD present. No tracheal deviation present. No thyromegaly present.  Cardiovascular: Normal rate, regular rhythm, normal heart sounds and intact distal pulses.  Pulmonary/Chest: Effort normal and breath sounds normal. No respiratory distress.  Abdominal: Soft. Bowel sounds are normal. She exhibits no distension. There is no tenderness.  Musculoskeletal: She exhibits no edema.  Lymphadenopathy:    She has no cervical adenopathy.  Neurological: She is alert and oriented to person, place, and time.  Skin: Skin is warm and dry.  Psychiatric: She has a normal mood and affect. Her behavior is normal. Judgment and thought content normal.  Vitals reviewed.   Results for orders placed or performed during the hospital encounter of 04/20/17  CBC  Result Value Ref Range   WBC 6.1 3.6 - 11.0 K/uL   RBC 4.17 3.80 - 5.20 MIL/uL   Hemoglobin 12.6 12.0 - 16.0 g/dL   HCT 38.0 35.0 - 47.0 %   MCV 91.1 80.0 - 100.0 fL   MCH 30.2 26.0 - 34.0 pg   MCHC 33.1 32.0 - 36.0 g/dL   RDW 15.8 (H) 11.5 - 14.5 %   Platelets 185 150 - 440 K/uL  Ferritin  Result Value Ref Range   Ferritin 89 11 - 307 ng/mL  Iron and TIBC  Result Value Ref Range   Iron 58 28 - 170 ug/dL   TIBC 316 250 - 450 ug/dL   Saturation Ratios 18 10.4 - 31.8 %   UIBC 258 ug/dL      Assessment & Plan:   Problem List Items Addressed This Visit      Cardiovascular  and Mediastinum   Benign essential HTN    Controlled and at goal today.  Pt taking lisinopril and furosemide.  History of gout - avoid HCTZ.  Plan: 1. Continue taking medicines without changes 2. Obtain labs at next visit.   3. Encouraged heart healthy diet and continued effort to increase exercise. 4. Check BP 1-2 x per week at home, keep log, and bring to clinic at next appointment. 5. Follow up 6 months.         Respiratory   Chronic obstructive pulmonary disease (HCC)    Pt w/ controlled COPD symptoms.  No active exacerbation on exam today and no significant difficulty breathing.  No recent PFT, but no concern for worsening lung function after exam.  Plan: 1. Continue albuterol prn up to 6 times daily. 2. Follow up 6 months.       Other Visit Diagnoses    Buccal mass    -  Primary Pt presents today w/ subacute buccal mass consistent w/ mucosal polyp vs oral cancer.  If benign, pt still desires removal as it is irritating to her.  Plan: 1. Referral to ENT - Dr. Benjamine Mola.  Called office to verify he would consider removing the polyp at exam.  If not able, pt will need to visit a dentist or oral surgeon. 2. Followup as needed if additional referral required.   Relevant Orders   Ambulatory referral to ENT       Follow up plan: Return in about 6 months (around 01/17/2018) for BP, COPD.  [name], DNP, AGPCNP-BC Adult Gerontology Primary Care Nurse Practitioner [facility] 07/20/2017, 1:42 PM

## 2017-07-20 NOTE — Patient Instructions (Addendum)
Kristin [name], Thank you for coming in to clinic today.  1. For your mouth: - I am placing a referral to ENT for removal. Dr. Benjamine Mola [address] Kristin [name], [place]: Between [address] and [place] telephone: [phone]  2. If you do not hear back from the breast center in the next 1 week, let me know.  You do need followup for your mammogram.  Please schedule a follow-up appointment with [name], AGNP. Return in about 6 months (around 01/17/2018) for BP, COPD.  If you have any other questions or concerns, please feel free to call the clinic or send a message through [place]. You may also schedule an earlier appointment if necessary.  You will receive a survey after today's visit either digitally by e-mail or paper by [employer]. Your experiences and feedback matter to [name].  Please respond so we know how we are doing as we provide care for you.  [name], DNP, AGNP-BC Adult Gerontology Nurse Practitioner [place]

## 2017-07-20 NOTE — Assessment & Plan Note (Signed)
Pt w/ controlled COPD symptoms.  No active exacerbation on exam today and no significant difficulty breathing.  No recent PFT, but no concern for worsening lung function after exam.  Plan: 1. Continue albuterol prn up to 6 times daily. 2. Follow up 6 months.

## 2017-07-20 NOTE — Assessment & Plan Note (Signed)
Controlled and at goal today.  Pt taking lisinopril and furosemide.  History of gout - avoid HCTZ.  Plan: 1. Continue taking medicines without changes 2. Obtain labs at next visit.   3. Encouraged heart healthy diet and continued effort to increase exercise. 4. Check BP 1-2 x per week at home, keep log, and bring to clinic at next appointment. 5. Follow up 6 months.

## 2017-07-21 ENCOUNTER — Ambulatory Visit
Admission: RE | Admit: 2017-07-21 | Discharge: 2017-07-21 | Disposition: A | Discharge status: Home / Self Care | Payer: Medicare Other | Source: Ambulatory Visit | Attending: Nurse Practitioner | Admitting: Nurse Practitioner

## 2017-07-21 DIAGNOSIS — N6489 Other specified disorders of breast: Secondary | ICD-10-CM | POA: Diagnosis present

## 2017-07-21 DIAGNOSIS — R928 Other abnormal and inconclusive findings on diagnostic imaging of breast: Secondary | ICD-10-CM | POA: Diagnosis present

## 2017-07-29 ENCOUNTER — Other Ambulatory Visit: Payer: Self-pay | Admitting: Pain Medicine

## 2017-07-29 DIAGNOSIS — K5903 Drug induced constipation: Secondary | ICD-10-CM

## 2017-07-29 DIAGNOSIS — T402X5A Adverse effect of other opioids, initial encounter: Principal | ICD-10-CM

## 2017-08-05 ENCOUNTER — Telehealth: Payer: Self-pay | Admitting: *Deleted

## 2017-08-05 NOTE — Patient Outreach (Signed)
[facility]) Care Management  08/05/2017  TERENCE GOOGE 12-08-1929 [id number]   Case closure  CM reviewed Mrs Raus case for further needs. CM reviewed recent Epic pcp note which reports Mrs Barner is doing well. Cm spoke with Mrs Fariss about her met goals and her recent pcp visit.  She feels she is doing well also except still has chronic back pain but continues to see her pain management provider.  She reports changes in her pain management medicines and that they are not as effective as previous medicines.  She and CM discussed opiod medicine changes.  Cm encourage her to speak with her pain management provider about the present medicines not being effective and if needed speak with her pcp about other options.  Socially she reports the recent death of her brother whom she states "is no longer suffering"   Plan case closure  Closure reason-Consumer successfully achieved goal Pt has met her [place] care goals Send letter to patient and pcp Updated [facility] CMA  [facility] CM Care Plan Problem One     Most Recent Value  Care Plan Problem One  Knowledge deficit of congestive heart failure home management (signs/symptoms, treatment interventions  Role Documenting the Problem One  Care Management Coordinator  Care Plan for Problem One  Active  [facility] Long Term Goal   Over the next 45 days the patient will be able to verbalize knowledge  and demonstrate home management interventions for CHF  [facility] Long Term Goal Start Date  12/07/16  [facility] Long Term Goal Met Date  12/21/16  Interventions for Problem One Long Term Goal  Assess and discuss CHF diagnosis, sympotoms/zone symptoms to report to doctor, diet, activity, medications , weights Provided  written materials, monitor for symptoms like sob weight gain & edema  [facility] CM Short Term Goal #1   over the next 7 days the patient will take ordered diuretics to decrease edema  [facility] CM Short Term Goal #1 Start Date  12/07/16  [facility] CM Short Term  Goal #1 Met Date  12/21/16  Interventions for Short Term Goal #1  Assess zone CHF symptoms, examined feet, lungs, medications, called pcp with noted CHF symptoms, Order for increase in lasix obtained Educated pt on zone symptoms, medications (how to take new ordered medications) and when to call pcp Provieded written CHF materials   [facility] CM Short Term Goal #2   Over the next 14-21 days the patient will be able to knowledge about CHF home treatment to include verbalizing zone symptoms she will call into her doctor, daily weights, diet, activity and understanding of use of CHF medications  [facility] CM Short Term Goal #2 Start Date  12/07/16  [facility] CM Short Term Goal #2 Met Date  12/21/16  Interventions for Short Term Goal #2  assess and education on CHF home treatment using CHF booklet/handouts/Use teach back method to evaluate knowledge of CHF home care needs     [facility] CM Care Plan Problem Two     Most Recent Value  Care Plan Problem Two   Possible side effect of Lisinopril or CHF yellow zone coughing symptom assistance needed plus  increase cost in medications reported (referral to [facility] pharmacist)   Role Documenting the Problem Two  Care Management [place] for Problem Two  Active  Interventions for Problem Two Long Term Goal   Discussed lisinopril side effect with pcp, NP and patient, [facility] pharmacy referral after permission given  [facility] Long Term Goal  over the next 30 - 45 days patient will be assisted with lisinopril and her medication costs  [facility] Long Term Goal Start Date  12/07/16  [facility] Long Term Goal Met Date  01/05/17  [facility] CM Short Term Goal #1   Over the next 30 days the patient will receive [facility] pharmacy referral for asisstance with Lisinopril use and possible assistance with costs of medications  [facility] CM Short Term Goal #1 Start Date  12/07/16  [facility] CM Short Term Goal #1 Met Date   02/07/17  Interventions for Short Term Goal #2   [facility] pharmacy referral Discussed CHF symptoms related to  medicines    [facility] CM Care Plan Problem Three     Most Recent Value  Care Plan Problem Three  Knowledge of gout flare up management   Role Documenting the Problem Three  Care Management Coordinator  Care Plan for Problem Three  Active  [facility]N Long Term Goal   over the next 31 days gout flare up will be resolved  [facility]N Long Term Goal Start Date  12/21/16  [facility] Long Term Goal Met Date  01/05/17  Interventions for Problem Three Long Term Goal  review gout symptoms, encourage call to pcp, assessed painful gout site Provided written symptoms to monitor for   [facility] CM Short Term Goal #1   over the next 14 day patient will make contact with pcp to request treatment plan for gout management/resolution  [facility] CM Short Term Goal #1 Start Date  12/21/16  [facility] CM Short Term Goal #1 Met Date  01/05/17  Interventions for Short Term Goal #1  review gout symptoms, encourage call to pcp, assessed painful gout site Provided written symptoms to monitor for         Kimberly L. Lavina Hamman, RN, BSN, [place] Care Management [phone]

## 2017-08-09 ENCOUNTER — Other Ambulatory Visit: Payer: Self-pay

## 2017-08-09 ENCOUNTER — Ambulatory Visit: Payer: Medicare Other | Attending: Nurse Practitioner | Admitting: Nurse Practitioner

## 2017-08-09 ENCOUNTER — Encounter: Payer: Self-pay | Admitting: Nurse Practitioner

## 2017-08-09 VITALS — BP 102/70 | HR 87 | Temp 97.5°F | Resp 16 | Ht 68.0 in | Wt 156.0 lb

## 2017-08-09 DIAGNOSIS — Z7901 Long term (current) use of anticoagulants: Secondary | ICD-10-CM | POA: Diagnosis not present

## 2017-08-09 DIAGNOSIS — M109 Gout, unspecified: Secondary | ICD-10-CM | POA: Diagnosis not present

## 2017-08-09 DIAGNOSIS — G894 Chronic pain syndrome: Secondary | ICD-10-CM | POA: Insufficient documentation

## 2017-08-09 DIAGNOSIS — I130 Hypertensive heart and chronic kidney disease with heart failure and stage 1 through stage 4 chronic kidney disease, or unspecified chronic kidney disease: Secondary | ICD-10-CM | POA: Diagnosis not present

## 2017-08-09 DIAGNOSIS — M5441 Lumbago with sciatica, right side: Secondary | ICD-10-CM

## 2017-08-09 DIAGNOSIS — G2581 Restless legs syndrome: Secondary | ICD-10-CM | POA: Insufficient documentation

## 2017-08-09 DIAGNOSIS — Z87891 Personal history of nicotine dependence: Secondary | ICD-10-CM | POA: Insufficient documentation

## 2017-08-09 DIAGNOSIS — M5442 Lumbago with sciatica, left side: Secondary | ICD-10-CM | POA: Diagnosis not present

## 2017-08-09 DIAGNOSIS — Z5181 Encounter for therapeutic drug level monitoring: Secondary | ICD-10-CM | POA: Diagnosis present

## 2017-08-09 DIAGNOSIS — M48061 Spinal stenosis, lumbar region without neurogenic claudication: Secondary | ICD-10-CM | POA: Diagnosis not present

## 2017-08-09 DIAGNOSIS — Z881 Allergy status to other antibiotic agents status: Secondary | ICD-10-CM | POA: Diagnosis not present

## 2017-08-09 DIAGNOSIS — M25511 Pain in right shoulder: Secondary | ICD-10-CM | POA: Diagnosis not present

## 2017-08-09 DIAGNOSIS — M5416 Radiculopathy, lumbar region: Secondary | ICD-10-CM | POA: Diagnosis not present

## 2017-08-09 DIAGNOSIS — E785 Hyperlipidemia, unspecified: Secondary | ICD-10-CM | POA: Insufficient documentation

## 2017-08-09 DIAGNOSIS — M533 Sacrococcygeal disorders, not elsewhere classified: Secondary | ICD-10-CM | POA: Insufficient documentation

## 2017-08-09 DIAGNOSIS — Z950 Presence of cardiac pacemaker: Secondary | ICD-10-CM | POA: Insufficient documentation

## 2017-08-09 DIAGNOSIS — J449 Chronic obstructive pulmonary disease, unspecified: Secondary | ICD-10-CM | POA: Diagnosis not present

## 2017-08-09 DIAGNOSIS — I4891 Unspecified atrial fibrillation: Secondary | ICD-10-CM | POA: Diagnosis not present

## 2017-08-09 DIAGNOSIS — Z79899 Other long term (current) drug therapy: Secondary | ICD-10-CM | POA: Insufficient documentation

## 2017-08-09 DIAGNOSIS — I5032 Chronic diastolic (congestive) heart failure: Secondary | ICD-10-CM | POA: Insufficient documentation

## 2017-08-09 DIAGNOSIS — G8929 Other chronic pain: Secondary | ICD-10-CM

## 2017-08-09 DIAGNOSIS — Z79891 Long term (current) use of opiate analgesic: Secondary | ICD-10-CM | POA: Insufficient documentation

## 2017-08-09 DIAGNOSIS — N183 Chronic kidney disease, stage 3 (moderate): Secondary | ICD-10-CM | POA: Diagnosis not present

## 2017-08-09 MED ORDER — MELATONIN 10 MG PO CAPS: 20.0000 mg | ORAL_CAPSULE | Freq: Every evening | ORAL | 2 refills | Status: DC | PRN

## 2017-08-09 MED ORDER — OXYCODONE HCL 20 MG PO TABS: 20.0000 mg | ORAL_TABLET | Freq: Three times a day (TID) | ORAL | 0 refills | Status: DC | PRN

## 2017-08-09 NOTE — Progress Notes (Signed)
Nursing Pain Medication Assessment:  Safety precautions to be maintained throughout the outpatient stay will include: orient to surroundings, keep bed in low position, maintain call bell within reach at all times, provide assistance with transfer out of bed and ambulation.  Medication Inspection Compliance: Pill count conducted under aseptic conditions, in front of the patient. Neither the pills nor the bottle was removed from the patient's sight at any time. Once count was completed pills were immediately returned to the patient in their original bottle.  Medication: See above Pill/Patch Count: 2 of 90 pills remain Pill/Patch Appearance: Markings consistent with prescribed medication Bottle Appearance: Standard pharmacy container. Clearly labeled. Filled Date: 40 / 19/ 2018 Last Medication intake:  Today

## 2017-08-09 NOTE — Progress Notes (Signed)
Patient's Name: Kristin [name]  MRN: [id number]  Referring Provider: Mikey College, *  DOB: Nov 13, 1929  PCP: Mikey College, NP  DOS: 08/09/2017  Note by: Vevelyn Francois NP  Service setting: Ambulatory outpatient  Specialty: Interventional Pain Management  Location: [facility] (AMB) Pain Management Facility    Patient type: Established    Primary Reason(s) for Visit: Encounter for prescription drug management. (Level of risk: moderate)  CC: Back Pain (low) and Shoulder Pain (left)  HPI  Kristin [name] is a 83 y.o. year old, female patient, who comes today for a medication management evaluation. She has Chronic kidney disease (CKD), stage III (moderate) (HCC); DDD (degenerative disc disease), lumbosacral; Gout; Benign essential HTN; Lumbar central spinal stenosis; Chronic obstructive pulmonary disease ([place]); Chemical diabetes; History of cardiac catheterization; Seasonal allergies; History of breast cancer; Chronic pain syndrome; Chronic low back pain (Primary Source of Pain) (Bilateral) (L>R); Lumbar facet syndrome (Bilateral) (L>R); Lumbar spondylosis; Chronic lumbar radicular pain (Bilateral) (L>R) (L5 Dermatome); Restless leg syndrome; Myofascial pain; Neurogenic pain; Long term current use of opiate analgesic; Long term prescription opiate use; Opiate use (90 MME/Day); Opioid-induced constipation (OIC); Vitamin D insufficiency; Grade 1 Retrolisthesis of L1 over L2; Lumbar facet hypertrophy (L1-2, L2-3, and L4-5); Lumbar lateral recess stenosis (L1-2 and L4-5); 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); Epidural fibrosis; Lumbar foraminal stenosis (Severe Left L4-5); 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); Osteoporosis; Chronic sacroiliac joint pain (Left); History of methicillin resistant staphylococcus aureus (MRSA); Mitral valve prolapse; New onset atrial flutter (HCC); GERD (gastroesophageal reflux disease); Chronic shoulder pain (S/P replacement) (Right);  Osteoarthritis, multiple sites; Shoulder pain S/P prostheses (Right); Chronic diastolic CHF (congestive heart failure) ([place]); S/P cardiac pacemaker procedure; Anticoagulated; Hyperlipidemia, unspecified; Chronic neck pain; Chronic hip pain (Bilateral); Chronic cervical radicular pain  (Left); Long term current use of anticoagulant; Chronic lower extremity pain (Secondary Area of Pain) (Bilateral) (L>R); and DDD (degenerative disc disease), cervical on their problem list. Her primarily concern today is the Back Pain (low) and Shoulder Pain (left)  Pain Assessment: Location: Left Back(shoulder) Radiating: none Onset: More than a month ago Duration: Chronic pain Quality: Aching Severity: 5 /10 (self-reported pain score)  Note: Reported level is compatible with observation.                          Effect on ADL: does not Timing: Intermittent Modifying factors: sitting down  Kristin [name] was last scheduled for an appointment on 05/24/2017 for medication management. During today's appointment we reviewed Kristin [name]'s chronic pain status, as well as her outpatient medication regimen. She admits that she continues to have pain . She is conern today because she is not sleeping. She admits that it pain related. She states that she has been on Ambien in the past. She also has RLS and uses Requip. She states that this is effective however she feels like she needs to start it earlier. She admits that the symptoms start around 4 pm.m    The patient  reports that she does not use drugs. Her body mass index is 23.72 kg/m.  Further details on both, my assessment(s), as well as the proposed treatment plan, please see below.  Controlled Substance Pharmacotherapy Assessment REMS (Risk Evaluation and Mitigation Strategy)  Analgesic:Oxycodone IR 20 mg every 8 hours (60 mg/day) MME/day:90 mg/day Morley Kos, RN  08/09/2017  1:02 PM  Sign at close encounter Nursing Pain Medication Assessment:  Safety  precautions to be maintained  throughout the outpatient stay will include: orient to surroundings, keep bed in low position, maintain call bell within reach at all times, provide assistance with transfer out of bed and ambulation.  Medication Inspection Compliance: Pill count conducted under aseptic conditions, in front of the patient. Neither the pills nor the bottle was removed from the patient's sight at any time. Once count was completed pills were immediately returned to the patient in their original bottle.  Medication: See above Pill/Patch Count: 2 of 90 pills remain Pill/Patch Appearance: Markings consistent with prescribed medication Bottle Appearance: Standard pharmacy container. Clearly labeled. Filled Date: 27 / 19/ 2018 Last Medication intake:  Today   Pharmacokinetics: Liberation and absorption (onset of action): WNL Distribution (time to peak effect): WNL Metabolism and excretion (duration of action): WNL         Pharmacodynamics: Desired effects: Analgesia: Kristin [name] reports >50% benefit. Functional ability: Patient reports that medication allows her to accomplish basic ADLs Clinically meaningful improvement in function (CMIF): Sustained CMIF goals met Perceived effectiveness: Described as relatively effective, allowing for increase in activities of daily living (ADL) Undesirable effects: Side-effects or Adverse reactions: None reported Monitoring: [place] PMP: Online review of the past 46-monthperiod conducted. Compliant with practice rules and regulations Last UDS on record: Summary  Date Value Ref Range Status  03/01/2017 FINAL  Final    Comment:    ==================================================================== TOXASSURE SELECT 13 (MW) ==================================================================== Test                             Result       Flag       Units Drug Present and Declared for Prescription Verification   Oxycodone                      4800          EXPECTED   ng/mg creat   Oxymorphone                    1386         EXPECTED   ng/mg creat   Noroxycodone                   5976         EXPECTED   ng/mg creat   Noroxymorphone                 572          EXPECTED   ng/mg creat    Sources of oxycodone are scheduled prescription medications.    Oxymorphone, noroxycodone, and noroxymorphone are expected    metabolites of oxycodone. Oxymorphone is also available as a    scheduled prescription medication. ==================================================================== Test                      Result    Flag   Units      Ref Range   Creatinine              29               mg/dL      >=20 ==================================================================== Declared Medications:  The flagging and interpretation on this report are based on the  following declared medications.  Unexpected results may arise from  inaccuracies in the declared medications.  **Note: The testing scope of this panel includes these medications:  Oxycodone  **Note: The testing scope  of this panel does not include following  reported medications:  Albuterol  Apixaban  Aspirin  Bisacodyl  Cholecalciferol  Colchicine  Furosemide  Levocetirizine  Lisinopril  Oxygen  Polyethylene Glycol  Ropinirole ==================================================================== For clinical consultation, please call [phone]. ====================================================================    UDS interpretation: Compliant          Medication Assessment Form: Reviewed. Patient indicates being compliant with therapy Treatment compliance: Compliant Risk Assessment Profile: Aberrant behavior: See prior evaluations. None observed or detected today Comorbid factors increasing risk of overdose: See prior notes. No additional risks detected today Risk of substance use disorder (SUD): Low Opioid Risk Tool - 08/09/17 1256      Family History of Substance  Abuse   Alcohol  Negative    Illegal Drugs  Negative    Rx Drugs  Negative      Personal History of Substance Abuse   Alcohol  Negative    Illegal Drugs  Negative    Rx Drugs  Negative      Age   Age between 26-45 years   No      History of Preadolescent Sexual Abuse   History of Preadolescent Sexual Abuse  Negative or Female      Psychological Disease   Psychological Disease  Negative    Depression  Negative      Total Score   Opioid Risk Tool Scoring  0    Opioid Risk Interpretation  Low Risk      ORT Scoring interpretation table:  Score <3 = Low Risk for SUD  Score between 4-7 = Moderate Risk for SUD  Score >8 = High Risk for Opioid Abuse   Risk Mitigation Strategies:  Patient Counseling: Covered Patient-Prescriber Agreement (PPA): Present and active  Notification to other healthcare providers: Done  Pharmacologic Plan: No change in therapy, at this time  Laboratory Chemistry  Inflammation Markers (CRP: Acute Phase) (ESR: Chronic Phase) Lab Results  Component Value Date   CRP <0.5 09/24/2015   ESRSEDRATE 12 09/24/2015                 Rheumatology Markers No results found for: RF, ANA, [facility], [id number], [facility]              Renal Function Markers Lab Results  Component Value Date   BUN 20 12/24/2016   CREATININE 0.98 12/24/2016   GFRAA 59 (L) 12/24/2016   GFRNONAA 51 (L) 12/24/2016                 Hepatic Function Markers Lab Results  Component Value Date   AST 21 03/02/2016   ALT 10 03/02/2016   ALBUMIN 3.7 03/02/2016   ALKPHOS 74 03/02/2016                 Electrolytes Lab Results  Component Value Date   NA 139 12/24/2016   K 4.0 12/24/2016   CL 107 12/24/2016   CALCIUM 9.7 12/24/2016   MG 2.2 09/24/2015                 Neuropathy Markers No results found for: VITAMINB12, FOLATE, HGBA1C, HIV               Bone Pathology Markers No results found for: VD25OH, VD125OH2TOT, [id number], [id number], 25OHVITD1, 25OHVITD2,  25OHVITD3, TESTOFREE, TESTOSTERONE               Coagulation Parameters Lab Results  Component Value Date   INR 1.24 12/16/2015   LABPROT 15.8 (H) 12/16/2015  APTT 26 12/16/2015   PLT 185 04/20/2017                 Cardiovascular Markers Lab Results  Component Value Date   TROPONINI <0.03 10/29/2015   HGB 12.6 04/20/2017   HCT 38.0 04/20/2017                 CA Markers Lab Results  Component Value Date   LABCA2 49.4 (H) 07/10/2013                 Note: Lab results reviewed.  Recent Diagnostic Imaging Results  MM DIAG BREAST TOMO UNI LEFT CLINICAL DATA:  Callback from screening mammogram for possible asymmetry left breast  EXAM: 2D DIGITAL DIAGNOSTIC LEFT MAMMOGRAM WITH ADJUNCT TOMO  ULTRASOUND LEFT BREAST  COMPARISON:  Previous exam(s).  ACR Breast Density Category b: There are scattered areas of fibroglandular density.  FINDINGS: Spot compression CC and MLO views of the left breast are submitted. Previously questioned asymmetry does not persist on additional views.  Targeted ultrasound is performed, showing no focal abnormal discrete cystic or solid lesion in the retroareolar left breast.  IMPRESSION: Negative.  RECOMMENDATION: Management on clinical basis.  I have discussed the findings and recommendations with the patient. Results were also provided in writing at the conclusion of the visit. If applicable, a reminder letter will be sent to the patient regarding the next appointment.  BI-RADS CATEGORY  1: Negative.  Electronically Signed   By: Abelardo Diesel M.D.   On: 07/21/2017 13:37 US BREAST LTD UNI LEFT INC AXILLA CLINICAL DATA:  Callback from screening mammogram for possible asymmetry left breast  EXAM: 2D DIGITAL DIAGNOSTIC LEFT MAMMOGRAM WITH ADJUNCT TOMO  ULTRASOUND LEFT BREAST  COMPARISON:  Previous exam(s).  ACR Breast Density Category b: There are scattered areas of fibroglandular density.  FINDINGS: Spot compression CC  and MLO views of the left breast are submitted. Previously questioned asymmetry does not persist on additional views.  Targeted ultrasound is performed, showing no focal abnormal discrete cystic or solid lesion in the retroareolar left breast.  IMPRESSION: Negative.  RECOMMENDATION: Management on clinical basis.  I have discussed the findings and recommendations with the patient. Results were also provided in writing at the conclusion of the visit. If applicable, a reminder letter will be sent to the patient regarding the next appointment.  BI-RADS CATEGORY  1: Negative.  Electronically Signed   By: Abelardo Diesel M.D.   On: 07/21/2017 13:37  Complexity Note: Imaging results reviewed. Results shared with Ms. Leap, using Layman's terms.                         Meds   Current Outpatient Medications:  .  albuterol (PROVENTIL) (2.5 MG/3ML) 0.083% nebulizer solution, Take 3 mLs (2.5 mg total) by nebulization every 8 (eight) hours as needed. Shortness of breath and wheezing, Disp: 75 mL, Rfl: 12 .  apixaban (ELIQUIS) 5 MG TABS tablet, Take 1 tablet (5 mg total) by mouth 2 (two) times daily., Disp: 60 tablet, Rfl: 0 .  bisacodyl (DULCOLAX) 5 MG EC tablet, Take 2 tablets (10 mg total) by mouth daily as needed for moderate constipation., Disp: 60 tablet, Rfl: 2 .  Calcium-Vitamin D 600-200 MG-UNIT tablet, Take 1 tablet by mouth daily., Disp: , Rfl:  .  Cholecalciferol (VITAMIN D) 2000 units tablet, Take 2,000 Units by mouth daily. , Disp: , Rfl:  .  furosemide (LASIX) 20 MG tablet, Take 1  tablet (20 mg total) by mouth daily. (Patient taking differently: Take 20 mg by mouth daily. Take 1/2 tablet a day), Disp: 90 tablet, Rfl: 1 .  levocetirizine (XYZAL) 5 MG tablet, Take 1 tablet (5 mg total) by mouth every evening., Disp: 30 tablet, Rfl: 12 .  lisinopril (PRINIVIL,ZESTRIL) 10 MG tablet, Take 5 mg by mouth daily., Disp: , Rfl:  .  [START ON 10/09/2017] Oxycodone HCl 20 MG TABS, Take 1  tablet (20 mg total) by mouth every 8 (eight) hours as needed., Disp: 90 tablet, Rfl: 0 .  OXYGEN, Inhale 2 L into the lungs at bedtime. , Disp: , Rfl:  .  polyethylene glycol (MIRALAX / GLYCOLAX) packet, Take 17 g by mouth daily as needed for moderate constipation., Disp: 14 each, Rfl: 2 .  rOPINIRole (REQUIP) 2 MG tablet, Take 1 tablet (2 mg total) by mouth at bedtime., Disp: 30 tablet, Rfl: 2 .  Melatonin 10 MG CAPS, Take 20 mg by mouth at bedtime as needed., Disp: 60 capsule, Rfl: 2 .  [START ON 09/09/2017] Oxycodone HCl 20 MG TABS, Take 1 tablet (20 mg total) by mouth every 8 (eight) hours as needed., Disp: 90 tablet, Rfl: 0 .  Oxycodone HCl 20 MG TABS, Take 1 tablet (20 mg total) by mouth every 8 (eight) hours as needed., Disp: 90 tablet, Rfl: 0  ROS  Constitutional: Denies any fever or chills Gastrointestinal: No reported hemesis, hematochezia, vomiting, or acute GI distress Musculoskeletal: Denies any acute onset joint swelling, redness, loss of ROM, or weakness Neurological: No reported episodes of acute onset apraxia, aphasia, dysarthria, agnosia, amnesia, paralysis, loss of coordination, or loss of consciousness  Allergies  Ms. Lazcano is allergic to flexeril [cyclobenzaprine hcl] and vancomycin.  PFSH  Drug: Ms. Kayes  reports that she does not use drugs. Alcohol:  reports that she does not drink alcohol. Tobacco:  reports that she quit smoking about 18 years ago. Her smoking use included cigarettes. She has a 50.00 pack-year smoking history. she has never used smokeless tobacco. Medical:  has a past medical history of Arrhythmia, sinus node (04/03/2014), Arthritis, Arthritis, Atrial fibrillation ([place]), Back pain, Bradycardia, Breast cancer ([place]) (2004), Cancer ([place]) (2004), CHF (congestive heart failure) ([place]), Chronic back pain, CKD (chronic kidney disease), COPD (chronic obstructive pulmonary disease) ([place]), Cystocele, Decubitus ulcers, Gout, Hematuria, Hepatitis C, Hyperlipidemia,  Hypertension, Hypomagnesemia (06/25/2015), OAB (overactive bladder), Overactive bladder, Restless leg, Shoulder pain, bilateral, Urinary frequency, UTI (lower urinary tract infection), and Vaginal atrophy. Surgical: Ms. Chai  has a past surgical history that includes Abdominal hysterectomy; Back surgery; Rotator cuff repair; Breast surgery; Cataract extraction w/PHACO (10/19/2011); Cataract extraction w/PHACO (11/02/2011); Carpal tunnel release; Joint replacement; Colon surgery; Pacemaker insertion (N/A, 12/24/2015); Cholecystectomy; and Breast lumpectomy (Right, 2004). Family: family history includes Breast cancer in her paternal aunt; Breast cancer (age of onset: 53) in her sister; Cancer in her sister; Heart disease in her father and mother; Kidney disease in her brother; Stroke in her mother.  Constitutional Exam  General appearance: Well nourished, well developed, and well hydrated. In no apparent acute distress Vitals:   08/09/17 1248  BP: 102/70  Pulse: 87  Resp: 16  Temp: (!) 97.5 F (36.4 C)  TempSrc: Oral  Weight: 156 lb (70.8 kg)  Height: '5\' 8"'  (1.727 m)   BMI Assessment: Estimated body mass index is 23.72 kg/m as calculated from the following:   Height as of this encounter: '5\' 8"'  (1.727 m).   Weight as of this encounter: 156 lb (  70.8 kg). Psych/Mental status: Alert, oriented x 3 (person, place, & time)       Eyes: PERLA Respiratory: No evidence of acute respiratory distress  Cervical Spine Area Exam  Skin & Axial Inspection: No masses, redness, edema, swelling, or associated skin lesions Alignment: Symmetrical Functional ROM: Unrestricted ROM      Stability: No instability detected Muscle Tone/Strength: Functionally intact. No obvious neuro-muscular anomalies detected. Sensory (Neurological): Unimpaired Palpation: No palpable anomalies              Upper Extremity (UE) Exam    Side: Right upper extremity  Side: Left upper extremity  Skin & Extremity Inspection: Skin  color, temperature, and hair growth are WNL. No peripheral edema or cyanosis. No masses, redness, swelling, asymmetry, or associated skin lesions. No contractures.  Skin & Extremity Inspection: Skin color, temperature, and hair growth are WNL. No peripheral edema or cyanosis. No masses, redness, swelling, asymmetry, or associated skin lesions. No contractures.  Functional ROM: Unrestricted ROM          Functional ROM: Unrestricted ROM          Muscle Tone/Strength: Functionally intact. No obvious neuro-muscular anomalies detected.  Muscle Tone/Strength: Functionally intact. No obvious neuro-muscular anomalies detected.  Sensory (Neurological): Unimpaired          Sensory (Neurological): Unimpaired          Palpation: No palpable anomalies              Palpation: No palpable anomalies              Specialized Test(s): Deferred         Specialized Test(s): Deferred          Thoracic Spine Area Exam  Skin & Axial Inspection: No masses, redness, or swelling Alignment: Symmetrical Functional ROM: Unrestricted ROM Stability: No instability detected Muscle Tone/Strength: Functionally intact. No obvious neuro-muscular anomalies detected. Sensory (Neurological): Unimpaired Muscle strength & Tone: No palpable anomalies  Lumbar Spine Area Exam  Skin & Axial Inspection: No masses, redness, or swelling Alignment: Symmetrical Functional ROM: Unrestricted ROM      Stability: No instability detected Muscle Tone/Strength: Functionally intact. No obvious neuro-muscular anomalies detected. Sensory (Neurological): Unimpaired Palpation: No palpable anomalies       Provocative Tests: Lumbar Hyperextension and rotation test: evaluation deferred today       Lumbar Lateral bending test: evaluation deferred today       Patrick's Maneuver: evaluation deferred today                    Gait & Posture Assessment  Ambulation: Patient ambulates using a cane Gait: Relatively normal for age and body habitus Posture:  WNL   Lower Extremity Exam    Side: Right lower extremity  Side: Left lower extremity  Skin & Extremity Inspection: Skin color, temperature, and hair growth are WNL. No peripheral edema or cyanosis. No masses, redness, swelling, asymmetry, or associated skin lesions. No contractures.  Skin & Extremity Inspection: Skin color, temperature, and hair growth are WNL. No peripheral edema or cyanosis. No masses, redness, swelling, asymmetry, or associated skin lesions. No contractures.  Functional ROM: Unrestricted ROM          Functional ROM: Unrestricted ROM          Muscle Tone/Strength: Functionally intact. No obvious neuro-muscular anomalies detected.  Muscle Tone/Strength: Functionally intact. No obvious neuro-muscular anomalies detected.  Sensory (Neurological): Unimpaired  Sensory (Neurological): Unimpaired  Palpation: No palpable anomalies  Palpation: No  palpable anomalies   Assessment  Primary Diagnosis & Pertinent Problem List: The primary encounter diagnosis was Chronic lumbar radicular pain (Bilateral) (L>R) (L5 Dermatome). Diagnoses of Chronic shoulder pain (S/P replacement) (Right), Chronic low back pain (Primary Source of Pain) (Bilateral) (L>R), and Chronic pain syndrome were also pertinent to this visit.  Status Diagnosis  Controlled Controlled Controlled 1. Chronic lumbar radicular pain (Bilateral) (L>R) (L5 Dermatome)   2. Chronic shoulder pain (S/P replacement) (Right)   3. Chronic low back pain (Primary Source of Pain) (Bilateral) (L>R)   4. Chronic pain syndrome     Problems updated and reviewed during this visit: No problems updated. Plan of Care  Pharmacotherapy (Medications Ordered): Meds ordered this encounter  Medications  . Oxycodone HCl 20 MG TABS    Sig: Take 1 tablet (20 mg total) by mouth every 8 (eight) hours as needed.    Dispense:  90 tablet    Refill:  0    Do not add this medication to the electronic "Automatic Refill" notification system. Patient may  have prescription filled one day early if pharmacy is closed on scheduled refill date. Do not fill until: 10/09/2017 To last until:11/08/2017    Order Specific Question:   Supervising Provider    Answer:   Milinda Pointer [phone]  . Oxycodone HCl 20 MG TABS    Sig: Take 1 tablet (20 mg total) by mouth every 8 (eight) hours as needed.    Dispense:  90 tablet    Refill:  0    Do not add this medication to the electronic "Automatic Refill" notification system. Patient may have prescription filled one day early if pharmacy is closed on scheduled refill date. Do not fill until:09/09/2017 To last until: 10/09/2017    Order Specific Question:   Supervising Provider    Answer:   Milinda Pointer [phone]  . Oxycodone HCl 20 MG TABS    Sig: Take 1 tablet (20 mg total) by mouth every 8 (eight) hours as needed.    Dispense:  90 tablet    Refill:  0    Do not add this medication to the electronic "Automatic Refill" notification system. Patient may have prescription filled one day early if pharmacy is closed on scheduled refill date. Do not fill until: 08/10/2017 To last until: 09/09/2017    Order Specific Question:   Supervising Provider    Answer:   Milinda Pointer [phone]  . Melatonin 10 MG CAPS    Sig: Take 20 mg by mouth at bedtime as needed.    Dispense:  60 capsule    Refill:  2    Do not place this medication, or any other prescription from our practice, on "Automatic Refill". Patient may have prescription filled one day early if pharmacy is closed on scheduled refill date.    Order Specific Question:   Supervising Provider    Answer:   Milinda Pointer [[id number]]  This SmartLink is deprecated. Use AVSMEDLIST instead to display the medication list for a patient. Medications administered today: [name] had no medications administered during this visit. Lab-work, procedure(s), and/or referral(s): No orders of the defined types were placed in this encounter.  Imaging and/or  referral(s): None  Interventional management options: Planned, scheduled, and/or pending:  Not at this time May add one Requip. Considering Gabapentin for sleep and RLS    Considering:  Diagnostic bilateral sacroiliac joint block Possible bilateral sacroiliac joint RFA Diagnostic bilateral intra-articular hip joint injection Diagnostic bilateral femoral nerve + obturator nerve  block Possible bilateral femoral nerve + obturator nerve RFA Possible bilateral lumbar facet radiofrequency. (Left side done on 12/13/2016) Palliative bilateral lumbar facet block  Diagnostic right intra-articular shoulder joint injection Diagnostic right suprascapular nerve block Possible right suprascapular radiofrequencyablation.    Palliative PRN treatment(s):  Palliative bilateral lumbar facet block Diagnostic right intra-articular shoulder joint injection  Diagnostic right suprascapular nerve block Possible right suprascapular radiofrequencyablation   Provider-requested follow-up: Return in about 3 months (around 11/07/2017) for MedMgmt with [facility]).  Future Appointments  Date Time Provider [place]  10/25/2017 11:00 AM Alisa Graff, FNP [facility] None  11/01/2017 11:15 AM Vevelyn Francois, NP [facility] None  01/18/2018 10:20 AM Mikey College, NP [facility] None   Primary Care Physician: Mikey College, NP Location: [facility] Note by: Vevelyn Francois NP Date: 08/09/2017; Time: 1:18 PM  Pain Score Disclaimer: We use the NRS-11 scale. This is a self-reported, subjective measurement of pain severity with only modest accuracy. It is used primarily to identify changes within a particular patient. It must be understood that outpatient pain scales are significantly less accurate that those used for research, where they can be applied under ideal controlled circumstances with minimal exposure to variables. In reality, the score  is likely to be a combination of pain intensity and pain affect, where pain affect describes the degree of emotional arousal or changes in action readiness caused by the sensory experience of pain. Factors such as social and work situation, setting, emotional state, anxiety levels, expectation, and prior pain experience may influence pain perception and show large inter-individual differences that may also be affected by time variables.  Patient instructions provided during this appointment: Patient Instructions   ____________________________________________________________________________________________  Medication Rules  Applies to: All patients receiving prescriptions (written or electronic).  Pharmacy of record: Pharmacy where electronic prescriptions will be sent. If written prescriptions are taken to a different pharmacy, please inform the nursing staff. The pharmacy listed in the electronic medical record should be the one where you would like electronic prescriptions to be sent.  Prescription refills: Only during scheduled appointments. Applies to both, written and electronic prescriptions.  NOTE: The following applies primarily to controlled substances (Opioid* Pain Medications).   Patient's responsibilities: 1. Pain Pills: Bring all pain pills to every appointment (except for procedure appointments). 2. Pill Bottles: Bring pills in original pharmacy bottle. Always bring newest bottle. Bring bottle, even if empty. 3. Medication refills: You are responsible for knowing and keeping track of what medications you need refilled. The day before your appointment, write a list of all prescriptions that need to be refilled. Bring that list to your appointment and give it to the admitting nurse. Prescriptions will be written only during appointments. If you forget a medication, it will not be "Called in", "Faxed", or "electronically sent". You will need to get another appointment to get these  prescribed. 4. Prescription Accuracy: You are responsible for carefully inspecting your prescriptions before leaving our office. Have the discharge nurse carefully go over each prescription with you, before taking them home. Make sure that your name is accurately spelled, that your address is correct. Check the name and dose of your medication to make sure it is accurate. Check the number of pills, and the written instructions to make sure they are clear and accurate. Make sure that you are given enough medication to last until your next medication refill appointment. 5. Taking Medication: Take medication as prescribed. Never take more pills  than instructed. Never take medication more frequently than prescribed. Taking less pills or less frequently is permitted and encouraged, when it comes to controlled substances (written prescriptions).  6. Inform other Doctors: Always inform, all of your healthcare providers, of all the medications you take. 7. Pain Medication from other Providers: You are not allowed to accept any additional pain medication from any other Doctor or Healthcare provider. There are two exceptions to this rule. (see below) In the event that you require additional pain medication, you are responsible for notifying us, as stated below. 8. Medication Agreement: You are responsible for carefully reading and following our Medication Agreement. This must be signed before receiving any prescriptions from our practice. Safely store a copy of your signed Agreement. Violations to the Agreement will result in no further prescriptions. (Additional copies of our Medication Agreement are available upon request.) 9. Laws, Rules, & Regulations: All patients are expected to follow all Federal and [employer], Rules, [employer]. Ignorance of the Laws does not constitute a valid excuse. The use of any illegal substances is prohibited. 10. Adopted CDC guidelines & recommendations: Target dosing  levels will be at or below 60 MME/day. Use of benzodiazepines** is not recommended.  Exceptions: There are only two exceptions to the rule of not receiving pain medications from other Healthcare Providers. 1. Exception #1 (Emergencies): In the event of an emergency (i.e.: accident requiring emergency care), you are allowed to receive additional pain medication. However, you are responsible for: As soon as you are able, call our office [phone], at any time of the day or night, and leave a message stating your name, the date and nature of the emergency, and the name and dose of the medication prescribed. In the event that your call is answered by a member of our staff, make sure to document and save the date, time, and the name of the person that took your information.  2. Exception #2 (Planned Surgery): In the event that you are scheduled by another doctor or dentist to have any type of surgery or procedure, you are allowed (for a period no longer than 30 days), to receive additional pain medication, for the acute post-op pain. However, in this case, you are responsible for picking up a copy of our "Post-op Pain Management for Surgeons" handout, and giving it to your surgeon or dentist. This document is available at our office, and does not require an appointment to obtain it. Simply go to our office during business hours (Monday-Thursday from 8:00 AM to 4:00 PM) (Friday 8:00 AM to 12:00 Noon) or if you have a scheduled appointment with [name], prior to your surgery, and ask for it by name. In addition, you will need to provide [name] with your name, name of your surgeon, type of surgery, and date of procedure or surgery.  *Opioid medications include: morphine, codeine, oxycodone, oxymorphone, hydrocodone, hydromorphone, meperidine, tramadol, tapentadol, buprenorphine, fentanyl, methadone. **Benzodiazepine medications include: diazepam (Valium), alprazolam (Xanax), clonazepam (Klonopine), lorazepam (Ativan),  clorazepate (Tranxene), chlordiazepoxide (Librium), estazolam (Prosom), oxazepam (Serax), temazepam (Restoril), triazolam (Halcion)  You were given 3 prescriptions for Oxycodone today.  ____________________________________________________________________________________________

## 2017-08-09 NOTE — Patient Instructions (Addendum)
____________________________________________________________________________________________  Medication Rules  Applies to: All patients receiving prescriptions (written or electronic).  Pharmacy of record: Pharmacy where electronic prescriptions will be sent. If written prescriptions are taken to a different pharmacy, please inform the nursing staff. The pharmacy listed in the electronic medical record should be the one where you would like electronic prescriptions to be sent.  Prescription refills: Only during scheduled appointments. Applies to both, written and electronic prescriptions.  NOTE: The following applies primarily to controlled substances (Opioid* Pain Medications).   Patient's responsibilities: 1. Pain Pills: Bring all pain pills to every appointment (except for procedure appointments). 2. Pill Bottles: Bring pills in original pharmacy bottle. Always bring newest bottle. Bring bottle, even if empty. 3. Medication refills: You are responsible for knowing and keeping track of what medications you need refilled. The day before your appointment, write a list of all prescriptions that need to be refilled. Bring that list to your appointment and give it to the admitting nurse. Prescriptions will be written only during appointments. If you forget a medication, it will not be "Called in", "Faxed", or "electronically sent". You will need to get another appointment to get these prescribed. 4. Prescription Accuracy: You are responsible for carefully inspecting your prescriptions before leaving our office. Have the discharge nurse carefully go over each prescription with you, before taking them home. Make sure that your name is accurately spelled, that your address is correct. Check the name and dose of your medication to make sure it is accurate. Check the number of pills, and the written instructions to make sure they are clear and accurate. Make sure that you are given enough medication to  last until your next medication refill appointment. 5. Taking Medication: Take medication as prescribed. Never take more pills than instructed. Never take medication more frequently than prescribed. Taking less pills or less frequently is permitted and encouraged, when it comes to controlled substances (written prescriptions).  6. Inform other Doctors: Always inform, all of your healthcare providers, of all the medications you take. 7. Pain Medication from other Providers: You are not allowed to accept any additional pain medication from any other Doctor or Healthcare provider. There are two exceptions to this rule. (see below) In the event that you require additional pain medication, you are responsible for notifying us, as stated below. 8. Medication Agreement: You are responsible for carefully reading and following our Medication Agreement. This must be signed before receiving any prescriptions from our practice. Safely store a copy of your signed Agreement. Violations to the Agreement will result in no further prescriptions. (Additional copies of our Medication Agreement are available upon request.) 9. Laws, Rules, & Regulations: All patients are expected to follow all Federal and State Laws, Statutes, Rules, & Regulations. Ignorance of the Laws does not constitute a valid excuse. The use of any illegal substances is prohibited. 10. Adopted CDC guidelines & recommendations: Target dosing levels will be at or below 60 MME/day. Use of benzodiazepines** is not recommended.  Exceptions: There are only two exceptions to the rule of not receiving pain medications from other Healthcare Providers. 1. Exception #1 (Emergencies): In the event of an emergency (i.e.: accident requiring emergency care), you are allowed to receive additional pain medication. However, you are responsible for: As soon as you are able, call our office [phone], at any time of the day or night, and leave a message stating your  name, the date and nature of the emergency, and the name and dose of the medication   prescribed. In the event that your call is answered by a member of our staff, make sure to document and save the date, time, and the name of the person that took your information.  2. Exception #2 (Planned Surgery): In the event that you are scheduled by another doctor or dentist to have any type of surgery or procedure, you are allowed (for a period no longer than 30 days), to receive additional pain medication, for the acute post-op pain. However, in this case, you are responsible for picking up a copy of our "Post-op Pain Management for Surgeons" handout, and giving it to your surgeon or dentist. This document is available at our office, and does not require an appointment to obtain it. Simply go to our office during business hours (Monday-Thursday from 8:00 AM to 4:00 PM) (Friday 8:00 AM to 12:00 Noon) or if you have a scheduled appointment with [name], prior to your surgery, and ask for it by name. In addition, you will need to provide [name] with your name, name of your surgeon, type of surgery, and date of procedure or surgery.  *Opioid medications include: morphine, codeine, oxycodone, oxymorphone, hydrocodone, hydromorphone, meperidine, tramadol, tapentadol, buprenorphine, fentanyl, methadone. **Benzodiazepine medications include: diazepam (Valium), alprazolam (Xanax), clonazepam (Klonopine), lorazepam (Ativan), clorazepate (Tranxene), chlordiazepoxide (Librium), estazolam (Prosom), oxazepam (Serax), temazepam (Restoril), triazolam (Halcion)  You were given 3 prescriptions for Oxycodone today.  ____________________________________________________________________________________________

## 2017-08-14 ENCOUNTER — Inpatient Hospital Stay
Admission: EM | Admit: 2017-08-14 | Discharge: 2017-08-15 | DRG: 683 | Disposition: A | Discharge status: Home / Self Care | Payer: Medicare Other | Attending: Specialist | Admitting: Specialist

## 2017-08-14 ENCOUNTER — Encounter: Payer: Self-pay | Admitting: Emergency Medicine

## 2017-08-14 ENCOUNTER — Other Ambulatory Visit: Payer: Self-pay

## 2017-08-14 DIAGNOSIS — Z8249 Family history of ischemic heart disease and other diseases of the circulatory system: Secondary | ICD-10-CM

## 2017-08-14 DIAGNOSIS — N189 Chronic kidney disease, unspecified: Secondary | ICD-10-CM | POA: Diagnosis present

## 2017-08-14 DIAGNOSIS — R531 Weakness: Secondary | ICD-10-CM

## 2017-08-14 DIAGNOSIS — Z9221 Personal history of antineoplastic chemotherapy: Secondary | ICD-10-CM

## 2017-08-14 DIAGNOSIS — I482 Chronic atrial fibrillation: Secondary | ICD-10-CM | POA: Diagnosis present

## 2017-08-14 DIAGNOSIS — Z96611 Presence of right artificial shoulder joint: Secondary | ICD-10-CM | POA: Diagnosis present

## 2017-08-14 DIAGNOSIS — I4892 Unspecified atrial flutter: Secondary | ICD-10-CM | POA: Diagnosis present

## 2017-08-14 DIAGNOSIS — G4700 Insomnia, unspecified: Secondary | ICD-10-CM | POA: Diagnosis not present

## 2017-08-14 DIAGNOSIS — M5030 Other cervical disc degeneration, unspecified cervical region: Secondary | ICD-10-CM | POA: Diagnosis present

## 2017-08-14 DIAGNOSIS — M810 Age-related osteoporosis without current pathological fracture: Secondary | ICD-10-CM | POA: Diagnosis present

## 2017-08-14 DIAGNOSIS — I130 Hypertensive heart and chronic kidney disease with heart failure and stage 1 through stage 4 chronic kidney disease, or unspecified chronic kidney disease: Secondary | ICD-10-CM | POA: Diagnosis not present

## 2017-08-14 DIAGNOSIS — Z923 Personal history of irradiation: Secondary | ICD-10-CM

## 2017-08-14 DIAGNOSIS — N1831 Chronic kidney disease, stage 3a: Secondary | ICD-10-CM | POA: Diagnosis present

## 2017-08-14 DIAGNOSIS — N183 Chronic kidney disease, stage 3 (moderate): Secondary | ICD-10-CM | POA: Diagnosis present

## 2017-08-14 DIAGNOSIS — J449 Chronic obstructive pulmonary disease, unspecified: Secondary | ICD-10-CM | POA: Diagnosis not present

## 2017-08-14 DIAGNOSIS — Z841 Family history of disorders of kidney and ureter: Secondary | ICD-10-CM

## 2017-08-14 DIAGNOSIS — T502X5A Adverse effect of carbonic-anhydrase inhibitors, benzothiadiazides and other diuretics, initial encounter: Secondary | ICD-10-CM | POA: Diagnosis present

## 2017-08-14 DIAGNOSIS — M159 Polyosteoarthritis, unspecified: Secondary | ICD-10-CM | POA: Diagnosis present

## 2017-08-14 DIAGNOSIS — M5137 Other intervertebral disc degeneration, lumbosacral region: Secondary | ICD-10-CM | POA: Diagnosis not present

## 2017-08-14 DIAGNOSIS — I5032 Chronic diastolic (congestive) heart failure: Secondary | ICD-10-CM | POA: Diagnosis present

## 2017-08-14 DIAGNOSIS — E860 Dehydration: Secondary | ICD-10-CM | POA: Diagnosis present

## 2017-08-14 DIAGNOSIS — I951 Orthostatic hypotension: Secondary | ICD-10-CM | POA: Diagnosis not present

## 2017-08-14 DIAGNOSIS — Z79899 Other long term (current) drug therapy: Secondary | ICD-10-CM | POA: Diagnosis not present

## 2017-08-14 DIAGNOSIS — Z9841 Cataract extraction status, right eye: Secondary | ICD-10-CM

## 2017-08-14 DIAGNOSIS — N179 Acute kidney failure, unspecified: Principal | ICD-10-CM

## 2017-08-14 DIAGNOSIS — Z881 Allergy status to other antibiotic agents status: Secondary | ICD-10-CM

## 2017-08-14 DIAGNOSIS — Z90710 Acquired absence of both cervix and uterus: Secondary | ICD-10-CM

## 2017-08-14 DIAGNOSIS — Z7901 Long term (current) use of anticoagulants: Secondary | ICD-10-CM

## 2017-08-14 DIAGNOSIS — Z853 Personal history of malignant neoplasm of breast: Secondary | ICD-10-CM

## 2017-08-14 DIAGNOSIS — Z87440 Personal history of urinary (tract) infections: Secondary | ICD-10-CM

## 2017-08-14 DIAGNOSIS — Z87891 Personal history of nicotine dependence: Secondary | ICD-10-CM

## 2017-08-14 DIAGNOSIS — Z950 Presence of cardiac pacemaker: Secondary | ICD-10-CM

## 2017-08-14 DIAGNOSIS — Z9981 Dependence on supplemental oxygen: Secondary | ICD-10-CM

## 2017-08-14 DIAGNOSIS — Z9842 Cataract extraction status, left eye: Secondary | ICD-10-CM

## 2017-08-14 DIAGNOSIS — G2581 Restless legs syndrome: Secondary | ICD-10-CM | POA: Diagnosis present

## 2017-08-14 DIAGNOSIS — Z8614 Personal history of Methicillin resistant Staphylococcus aureus infection: Secondary | ICD-10-CM

## 2017-08-14 DIAGNOSIS — Z96653 Presence of artificial knee joint, bilateral: Secondary | ICD-10-CM | POA: Diagnosis present

## 2017-08-14 DIAGNOSIS — G894 Chronic pain syndrome: Secondary | ICD-10-CM | POA: Diagnosis present

## 2017-08-14 DIAGNOSIS — Z961 Presence of intraocular lens: Secondary | ICD-10-CM | POA: Diagnosis present

## 2017-08-14 DIAGNOSIS — Z9049 Acquired absence of other specified parts of digestive tract: Secondary | ICD-10-CM

## 2017-08-14 DIAGNOSIS — Z8619 Personal history of other infectious and parasitic diseases: Secondary | ICD-10-CM

## 2017-08-14 DIAGNOSIS — Z888 Allergy status to other drugs, medicaments and biological substances status: Secondary | ICD-10-CM

## 2017-08-14 DIAGNOSIS — Z803 Family history of malignant neoplasm of breast: Secondary | ICD-10-CM

## 2017-08-14 LAB — URINALYSIS, COMPLETE (UACMP) WITH MICROSCOPIC
Bacteria, UA: NONE SEEN
Bilirubin Urine: NEGATIVE
GLUCOSE, UA: NEGATIVE mg/dL
Ketones, ur: NEGATIVE mg/dL
Leukocytes, UA: NEGATIVE
Nitrite: NEGATIVE
PH: 5 (ref 5.0–8.0)
PROTEIN: NEGATIVE mg/dL
RBC / HPF: NONE SEEN RBC/hpf (ref 0–5)
SPECIFIC GRAVITY, URINE: 1.006 (ref 1.005–1.030)
WBC UA: NONE SEEN WBC/hpf (ref 0–5)

## 2017-08-14 LAB — BASIC METABOLIC PANEL
Anion gap: 7 (ref 5–15)
BUN: 76 mg/dL — ABNORMAL HIGH (ref 6–20)
CALCIUM: 9.6 mg/dL (ref 8.9–10.3)
CO2: 19 mmol/L — ABNORMAL LOW (ref 22–32)
CREATININE: 2.09 mg/dL — AB (ref 0.44–1.00)
Chloride: 109 mmol/L (ref 101–111)
GFR calc Af Amer: 24 mL/min — ABNORMAL LOW (ref >60)
GFR calc non Af Amer: 20 mL/min — ABNORMAL LOW (ref >60)
GLUCOSE: 88 mg/dL (ref 65–99)
Potassium: 4.8 mmol/L (ref 3.5–5.1)
Sodium: 135 mmol/L (ref 135–145)

## 2017-08-14 LAB — CBC
HCT: 37.1 % (ref 35.0–47.0)
Hemoglobin: 12 g/dL (ref 12.0–16.0)
MCH: 30.2 pg (ref 26.0–34.0)
MCHC: 32.3 g/dL (ref 32.0–36.0)
MCV: 93.7 fL (ref 80.0–100.0)
PLATELETS: 150 10*3/uL (ref 150–440)
RBC: 3.96 MIL/uL (ref 3.80–5.20)
RDW: 14.5 % (ref 11.5–14.5)
WBC: 5.9 10*3/uL (ref 3.6–11.0)

## 2017-08-14 LAB — GLUCOSE, CAPILLARY: Glucose-Capillary: 74 mg/dL (ref 65–99)

## 2017-08-14 LAB — TROPONIN I: Troponin I: <0.03 ng/mL (ref <0.03)

## 2017-08-14 MED ORDER — APIXABAN 5 MG PO TABS
5.0000 mg | ORAL_TABLET | Freq: Two times a day (BID) | ORAL | Status: DC
  Administered 2017-08-14 – 2017-08-15 (×2): 5 mg via ORAL
  Filled 2017-08-14 (×2): qty 1

## 2017-08-14 MED ORDER — SODIUM CHLORIDE 0.9 % IV SOLN
INTRAVENOUS | Status: DC
  Administered 2017-08-14 – 2017-08-15 (×2): via INTRAVENOUS

## 2017-08-14 MED ORDER — POLYETHYLENE GLYCOL 3350 17 G PO PACK: 17.0000 g | PACK | Freq: Every day | ORAL | Status: DC | PRN

## 2017-08-14 MED ORDER — ROPINIROLE HCL 1 MG PO TABS
2.0000 mg | ORAL_TABLET | Freq: Every day | ORAL | Status: DC
Start: 2017-08-14 — End: 2017-08-15
  Administered 2017-08-14: 2 mg via ORAL
  Filled 2017-08-14 (×2): qty 2

## 2017-08-14 MED ORDER — MELATONIN 5 MG PO TABS
20.0000 mg | ORAL_TABLET | Freq: Every evening | ORAL | Status: DC | PRN
  Filled 2017-08-14: qty 4

## 2017-08-14 MED ORDER — VITAMIN D 1000 UNITS PO TABS
2000.0000 [IU] | ORAL_TABLET | Freq: Every day | ORAL | Status: DC
  Administered 2017-08-15: 11:00:00 2000 [IU] via ORAL
  Filled 2017-08-14: qty 2

## 2017-08-14 MED ORDER — ONDANSETRON HCL 4 MG PO TABS: 4.0000 mg | ORAL_TABLET | Freq: Four times a day (QID) | ORAL | Status: DC | PRN

## 2017-08-14 MED ORDER — METOPROLOL TARTRATE 25 MG PO TABS
25.0000 mg | ORAL_TABLET | Freq: Two times a day (BID) | ORAL | Status: DC
  Filled 2017-08-14 (×2): qty 1

## 2017-08-14 MED ORDER — ALBUTEROL SULFATE (2.5 MG/3ML) 0.083% IN NEBU: 2.5000 mg | INHALATION_SOLUTION | RESPIRATORY_TRACT | Status: DC | PRN

## 2017-08-14 MED ORDER — SODIUM CHLORIDE 0.9 % IV BOLUS (SEPSIS)
1000.0000 mL | Freq: Once | INTRAVENOUS | Status: AC
  Administered 2017-08-14: 1000 mL via INTRAVENOUS

## 2017-08-14 MED ORDER — CETIRIZINE HCL 10 MG PO TABS
5.0000 mg | ORAL_TABLET | Freq: Every evening | ORAL | Status: DC
  Filled 2017-08-14: qty 1

## 2017-08-14 MED ORDER — BISACODYL 5 MG PO TBEC: 10.0000 mg | DELAYED_RELEASE_TABLET | Freq: Every day | ORAL | Status: DC | PRN

## 2017-08-14 MED ORDER — OXYCODONE HCL 5 MG PO TABS: 20.0000 mg | ORAL_TABLET | Freq: Three times a day (TID) | ORAL | Status: DC | PRN

## 2017-08-14 MED ORDER — ONDANSETRON HCL 4 MG/2ML IJ SOLN: 4.0000 mg | Freq: Four times a day (QID) | INTRAMUSCULAR | Status: DC | PRN

## 2017-08-14 NOTE — ED Notes (Signed)
Attempted iv access x 2 unsuccessful

## 2017-08-14 NOTE — ED Notes (Signed)
Lab worked obtained by Caryl Pina but unable to establish iv. Sent to lab.

## 2017-08-14 NOTE — ED Notes (Signed)
Lab unable to find the blood sent down.

## 2017-08-14 NOTE — ED Provider Notes (Signed)
[facility] Emergency Department Provider Note ____________________________________________   I have reviewed the triage vital signs and the triage nursing note.  HISTORY  Chief Complaint Weakness   Historian Patient  HPI Kristin [name] is a 83 y.o. female lives at home with her son, follows with Dr. Henrene Pastor shows for CHF and history of A. fib, she also has history of chronic kidney disease although previous creatinines were normal.  Patient reports fatigue over the past week or so.  No fevers.  No focal weakness or numbness.  No confusion or altered mental status.  No coughing.  No chest pain.  No abdominal pain.  No vomiting or diarrhea.  Unsure if she could be at risk for dehydration.  She states that she did have EMS come out to her house in the last few weeks and give her a bag of fluids and she felt better after that.     Past Medical History:  Diagnosis Date  . Arrhythmia, sinus node 04/03/2014  . Arthritis   . Arthritis   . Atrial fibrillation ([place])   . Back pain    lower back chronic  . Bradycardia   . Breast cancer ([place]) 2004   right breast cancer  . Cancer [facility]) 2004   rt breast cancer-post lumpectomy- chemo/rad  . CHF (congestive heart failure) ([place])   . Chronic back pain   . CKD (chronic kidney disease)   . COPD (chronic obstructive pulmonary disease) (HCC)    wears O2 at 2L via [place] at night  . Cystocele   . Decubitus ulcers   . Gout   . Hematuria   . Hepatitis C   . Hyperlipidemia   . Hypertension   . Hypomagnesemia 06/25/2015  . OAB (overactive bladder)   . Overactive bladder   . Restless leg   . Shoulder pain, bilateral   . Urinary frequency   . UTI (lower urinary tract infection)   . Vaginal atrophy     Patient Active Problem List   Diagnosis Date Noted  . Chronic lower extremity pain (Secondary Area of Pain) (Bilateral) (L>R) 05/13/2017  . DDD (degenerative disc disease), cervical 05/13/2017  . Chronic cervical radicular  pain  (Left) 05/04/2017  . Long term current use of anticoagulant 05/04/2017  . Chronic hip pain (Bilateral) 04/20/2017  . Chronic neck pain 03/01/2017  . Hyperlipidemia, unspecified 02/02/2017  . Anticoagulated 11/17/2016  . Shoulder pain S/P prostheses (Right) 07/28/2016  . Chronic shoulder pain (S/P replacement) (Right) 06/14/2016  . Osteoarthritis, multiple sites 06/14/2016  . S/P cardiac pacemaker procedure 01/07/2016  . GERD (gastroesophageal reflux disease) 12/18/2015  . New onset atrial flutter ([place]) 10/29/2015  . Chronic pain syndrome 06/25/2015  . Chronic low back pain (Primary Source of Pain) (Bilateral) (L>R) 06/25/2015  . Lumbar facet syndrome (Bilateral) (L>R) 06/25/2015  . Lumbar spondylosis 06/25/2015  . Chronic lumbar radicular pain (Bilateral) (L>R) (L5 Dermatome) 06/25/2015  . Restless leg syndrome 06/25/2015  . Myofascial pain 06/25/2015  . Neurogenic pain 06/25/2015  . Long term current use of opiate analgesic 06/25/2015  . Long term prescription opiate use 06/25/2015  . Opiate use (90 MME/Day) 06/25/2015  . Opioid-induced constipation (OIC) 06/25/2015  . Vitamin D insufficiency 06/25/2015  . Grade 1 Retrolisthesis of L1 over L2 06/25/2015  . Lumbar facet hypertrophy (L1-2, L2-3, and L4-5) 06/25/2015  . Lumbar lateral recess stenosis (L1-2 and L4-5) 06/25/2015  . 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 Epidural fibrosis 06/25/2015  .  Lumbar foraminal stenosis (Severe Left L4-5) 06/25/2015  . 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) 06/25/2015  . Osteoporosis 06/25/2015  . Chronic sacroiliac joint pain (Left) 06/25/2015  . History of methicillin resistant staphylococcus aureus (MRSA) 06/25/2015  . Mitral valve prolapse 06/25/2015  . 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 Seasonal allergies 06/17/2015  . History of breast cancer 06/17/2015  . Chronic diastolic CHF (congestive heart failure) ([place]) 12/26/2014  . Chronic  obstructive pulmonary disease ([place]) 09/16/2014  . History of cardiac catheterization 04/04/2014  . DDD (degenerative disc disease), lumbosacral 04/03/2014  . Benign essential HTN 04/03/2014  . Lumbar central spinal stenosis 04/03/2014  . Chronic kidney disease (CKD), stage III (moderate) ([place]) 10/30/2013  . Gout 10/30/2013    Past Surgical History:  Procedure Laterality Date  . ABDOMINAL HYSTERECTOMY    . BACK SURGERY    . BREAST LUMPECTOMY Right 2004  . BREAST SURGERY     rt lumpectomy  . CARPAL TUNNEL RELEASE    . CATARACT EXTRACTION W/PHACO  10/19/2011   Procedure: CATARACT EXTRACTION PHACO AND INTRAOCULAR LENS PLACEMENT (IOC);  Surgeon: Elta Guadeloupe T. Gershon Crane, MD;  Location: [facility];  Service: Ophthalmology;  Laterality: Right;  CDE:10.81  . CATARACT EXTRACTION W/PHACO  11/02/2011   Procedure: CATARACT EXTRACTION PHACO AND INTRAOCULAR LENS PLACEMENT (IOC);  Surgeon: Elta Guadeloupe T. Gershon Crane, MD;  Location: [facility];  Service: Ophthalmology;  Laterality: Left;  CDE 8.60  . CHOLECYSTECTOMY     3/18  . COLON SURGERY    . JOINT REPLACEMENT     bilateral TKA  . PACEMAKER INSERTION N/A 12/24/2015   Procedure: INSERTION PACEMAKER;  Surgeon: Isaias Cowman, MD;  Location: [facility] ORS;  Service: Cardiovascular;  Laterality: N/A;  . ROTATOR CUFF REPAIR     bilateral    Prior to Admission medications   Medication Sig Start Date End Date Taking? Authorizing Provider  bisacodyl (DULCOLAX) 5 MG EC tablet Take 2 tablets (10 mg total) by mouth daily as needed for moderate constipation. 05/12/17 08/14/17 Yes Milinda Pointer, MD  OXYGEN Inhale 2 L into the lungs at bedtime.    Yes [provider]  albuterol (PROVENTIL) (2.5 MG/3ML) 0.083% nebulizer solution Take 3 mLs (2.5 mg total) by nebulization every 8 (eight) hours as needed. Shortness of breath and wheezing 02/19/16   Arlis Porta., MD  apixaban (ELIQUIS) 5 MG TABS tablet Take 1 tablet (5 mg total) by mouth 2 (two) times daily. 10/30/15    Loletha Grayer, MD  Calcium-Vitamin D 600-200 MG-UNIT tablet Take 1 tablet by mouth daily.    [provider]  Cholecalciferol (VITAMIN D) 2000 units tablet Take 2,000 Units by mouth daily.     [provider]  furosemide (LASIX) 20 MG tablet Take 1 tablet (20 mg total) by mouth daily. Patient taking differently: Take 20 mg by mouth daily. Take 1/2 tablet a day 04/12/17   Mikey College, NP  levocetirizine (XYZAL) 5 MG tablet Take 1 tablet (5 mg total) by mouth every evening. 06/22/16   Arlis Porta., MD  lisinopril (PRINIVIL,ZESTRIL) 10 MG tablet Take 5 mg by mouth daily.    [provider]  Melatonin 10 MG CAPS Take 20 mg by mouth at bedtime as needed. 08/09/17   Vevelyn Francois, NP  Oxycodone HCl 20 MG TABS Take 1 tablet (20 mg total) by mouth every 8 (eight) hours as needed. 10/09/17 11/08/17  Vevelyn Francois, NP  Oxycodone HCl 20 MG TABS Take 1 tablet (20 mg total) by  mouth every 8 (eight) hours as needed. 09/09/17 10/09/17  Vevelyn Francois, NP  Oxycodone HCl 20 MG TABS Take 1 tablet (20 mg total) by mouth every 8 (eight) hours as needed. 08/10/17 09/09/17  Vevelyn Francois, NP  polyethylene glycol (MIRALAX / GLYCOLAX) packet Take 17 g by mouth daily as needed for moderate constipation. 05/12/17 08/10/17  Milinda Pointer, MD  rOPINIRole (REQUIP) 2 MG tablet Take 1 tablet (2 mg total) by mouth at bedtime. 05/12/17 08/10/17  Milinda Pointer, MD    Allergies  Allergen Reactions  . Flexeril [Cyclobenzaprine Hcl]     Severe Rash  . Vancomycin Rash    [place] Syndrome    Family History  Problem Relation Age of Onset  . Kidney disease Brother   . Heart disease Father   . Heart disease Mother   . Stroke Mother   . Cancer Sister   . Breast cancer Sister 31  . Breast cancer Paternal Aunt   . Anesthesia problems Neg Hx   . Hypotension Neg Hx   . Malignant hyperthermia Neg Hx   . Pseudochol deficiency Neg Hx     Social History Social History    Tobacco Use  . Smoking status: Former Smoker    Packs/day: 1.25    Years: 40.00    Pack years: 50.00    Types: Cigarettes    Last attempt to quit: 12/16/1998    Years since quitting: 18.6  . Smokeless tobacco: Never Used  Substance Use Topics  . Alcohol use: No    Alcohol/week: 0.0 oz  . Drug use: No    Review of Systems  Constitutional: Negative for fever. Eyes: Negative for visual changes. ENT: Negative for sore throat. Cardiovascular: Negative for chest pain. Respiratory: Negative for shortness of breath. Gastrointestinal: Negative for abdominal pain, vomiting and diarrhea. Genitourinary: Negative for dysuria, some frequency. Musculoskeletal: Negative for back pain. Skin: Negative for rash. Neurological: Negative for headache.  ____________________________________________   PHYSICAL EXAM:  VITAL SIGNS: ED Triage Vitals  Enc Vitals Group     BP 08/14/17 1126 (!) 129/109     Pulse Rate 08/14/17 1128 74     Resp 08/14/17 1126 19     Temp 08/14/17 1128 97.9 F (36.6 C)     Temp src --      SpO2 08/14/17 1128 100 %     Weight 08/14/17 1123 156 lb (70.8 kg)     Height 08/14/17 1123 5\' 8"  (1.727 m)     Head Circumference --      Peak Flow --      Pain Score 08/14/17 1122 0     Pain Loc --      Pain Edu? --      Excl. in [place]? --      Constitutional: Alert and oriented. Well appearing and in no distress. HEENT   Head: Normocephalic and atraumatic.      Eyes: Conjunctivae are normal. Pupils equal and round.       Ears:         Nose: No congestion/rhinnorhea.   Mouth/Throat: Mucous membranes are mildly dry.   Neck: No stridor. Cardiovascular/Chest: Normal rate, regular rhythm.  No murmurs, rubs, or gallops. Respiratory: Normal respiratory effort without tachypnea nor retractions. Breath sounds are clear and equal bilaterally. No wheezes/rales/rhonchi. Gastrointestinal: Soft. No distention, no guarding, no rebound. Nontender.     Genitourinary/rectal:Deferred Musculoskeletal: Nontender with normal range of motion in all extremities. No joint effusions.  No lower extremity tenderness.  No edema. Neurologic:  Normal speech and language. No gross or focal neurologic deficits are appreciated. Skin:  Skin is warm, dry and intact. No rash noted. Psychiatric: Mood and affect are normal. Speech and behavior are normal. Patient exhibits appropriate insight and judgment.   ____________________________________________  LABS (pertinent positives/negatives) I, Lisa Roca, MD the attending physician have reviewed the labs noted below.  Labs Reviewed  BASIC METABOLIC PANEL - Abnormal; Notable for the following components:      Result Value   CO2 19 (*)    BUN 76 (*)    Creatinine, Ser 2.09 (*)    GFR calc non Af Amer 20 (*)    GFR calc Af Amer 24 (*)    All other components within normal limits  URINALYSIS, COMPLETE (UACMP) WITH MICROSCOPIC - Abnormal; Notable for the following components:   Color, Urine STRAW (*)    APPearance CLEAR (*)    Hgb urine dipstick SMALL (*)    Squamous Epithelial / LPF 0-5 (*)    All other components within normal limits  CBC  GLUCOSE, CAPILLARY  TROPONIN I  CBG MONITORING, ED    ____________________________________________    EKG I, Lisa Roca, MD, the attending physician have personally viewed and interpreted all ECGs.  65 bpm.  Atrially paced rhythm.  Right bundle branch block and left anterior fascicular block.  Nonspecific T wave ____________________________________________  RADIOLOGY All Xrays were viewed by me.  Imaging interpreted by Radiologist, and I, Lisa Roca, MD the attending physician have reviewed the radiologist interpretation noted below.  None __________________________________________  PROCEDURES  Procedure(s) performed: None  Critical Care performed: None   ____________________________________________  ED COURSE / ASSESSMENT AND  PLAN  Pertinent labs & imaging results that were available during my care of the patient were reviewed by me and considered in my medical decision making (see chart for details).   Patient gives vague fatigue complaint without focal weakness.  Her exam is overall reassuring.  Patient found to be in acute renal failure, being started on IV fluid bolus.  Acute renal failure, will recommend hospital admission for treatment and management of her acute renal failure.  Troponin added on and pending upon hospice consultation.  DIFFERENTIAL DIAGNOSIS: Including but not limited to cardiac etiology, infections including urinary tract infection, electro light disturbance, dehydration, kidney failure, etc.  CONSULTATIONS:  Hospitalist for admission.  Patient / Family / Caregiver informed of clinical course, medical decision-making process, and agree with plan.  ___________________________________________   FINAL CLINICAL IMPRESSION(S) / ED DIAGNOSES   Final diagnoses:  Acute renal failure, unspecified acute renal failure type ([employer])  Weakness      ___________________________________________        Note: This dictation was prepared with Dragon dictation. Any transcriptional errors that result from this process are unintentional    Lisa Roca, MD 08/14/17 1421

## 2017-08-14 NOTE — H&P (Signed)
[place] [facility] [place] NAME: Kristin [name]    MR#:  [id number]  DATE OF BIRTH:  05/24/1930  DATE OF ADMISSION:  08/14/2017  PRIMARY CARE PHYSICIAN: Mikey College, NP   REQUESTING/REFERRING PHYSICIAN: Reita Cliche  CHIEF COMPLAINT:  Worsening of weakness  HISTORY OF PRESENT ILLNESS:  Kristin [name]  is a 83 y.o. female with a known history of chronic kidney disease but creatinine was normal in May 2018, chronic atrial fibrillation, history of breast cancer, congestive heart failure, essential hypertension and multiple other medical problems is presenting to the ED with a chief complaint of worsening of the weakness associated with dizziness. Admitted to the family patient is with poor by mouth intake for the past several days and progressively her weakness is getting worse. No fever. Denies any nausea vomiting diarrhea or constipation. No cough or no sick contacts. Creatinine today in the ED at 2.09, BUN is at 76. Patient has received 1 L fluid bolus  PAST MEDICAL HISTORY:   Past Medical History:  Diagnosis Date  . Arrhythmia, sinus node 04/03/2014  . Arthritis   . Arthritis   . Atrial fibrillation ([place])   . Back pain    lower back chronic  . Bradycardia   . Breast cancer ([place]) 2004   right breast cancer  . Cancer [facility]) 2004   rt breast cancer-post lumpectomy- chemo/rad  . CHF (congestive heart failure) ([place])   . Chronic back pain   . CKD (chronic kidney disease)   . COPD (chronic obstructive pulmonary disease) (HCC)    wears O2 at 2L via [place] at night  . Cystocele   . Decubitus ulcers   . Gout   . Hematuria   . Hepatitis C   . Hyperlipidemia   . Hypertension   . Hypomagnesemia 06/25/2015  . OAB (overactive bladder)   . Overactive bladder   . Restless leg   . Shoulder pain, bilateral   . Urinary frequency   . UTI (lower urinary tract infection)   . Vaginal atrophy     PAST SURGICAL HISTOIRY:   Past Surgical History:   Procedure Laterality Date  . ABDOMINAL HYSTERECTOMY    . BACK SURGERY    . BREAST LUMPECTOMY Right 2004  . BREAST SURGERY     rt lumpectomy  . CARPAL TUNNEL RELEASE    . CATARACT EXTRACTION W/PHACO  10/19/2011   Procedure: CATARACT EXTRACTION PHACO AND INTRAOCULAR LENS PLACEMENT (IOC);  Surgeon: Elta Guadeloupe T. Gershon Crane, MD;  Location: [facility];  Service: Ophthalmology;  Laterality: Right;  CDE:10.81  . CATARACT EXTRACTION W/PHACO  11/02/2011   Procedure: CATARACT EXTRACTION PHACO AND INTRAOCULAR LENS PLACEMENT (IOC);  Surgeon: Elta Guadeloupe T. Gershon Crane, MD;  Location: [facility];  Service: Ophthalmology;  Laterality: Left;  CDE 8.60  . CHOLECYSTECTOMY     3/18  . COLON SURGERY    . JOINT REPLACEMENT     bilateral TKA  . PACEMAKER INSERTION N/A 12/24/2015   Procedure: INSERTION PACEMAKER;  Surgeon: Isaias Cowman, MD;  Location: [facility] ORS;  Service: Cardiovascular;  Laterality: N/A;  . ROTATOR CUFF REPAIR     bilateral    SOCIAL HISTORY:   Social History   Tobacco Use  . Smoking status: Former Smoker    Packs/day: 1.25    Years: 40.00    Pack years: 50.00    Types: Cigarettes    Last attempt to quit: 12/16/1998    Years since quitting: 18.6  . Smokeless tobacco: Never Used  Substance Use Topics  . Alcohol use: No    Alcohol/week: 0.0 oz    FAMILY HISTORY:   Family History  Problem Relation Age of Onset  . Kidney disease Brother   . Heart disease Father   . Heart disease Mother   . Stroke Mother   . Cancer Sister   . Breast cancer Sister 29  . Breast cancer Paternal Aunt   . Anesthesia problems Neg Hx   . Hypotension Neg Hx   . Malignant hyperthermia Neg Hx   . Pseudochol deficiency Neg Hx     DRUG ALLERGIES:   Allergies  Allergen Reactions  . Flexeril [Cyclobenzaprine Hcl]     Severe Rash  . Vancomycin Rash    [place] Syndrome    REVIEW OF SYSTEMS:  CONSTITUTIONAL: No fever, fatigue ; reporting worsening of generalized weakness.  EYES: No blurred or double vision.   EARS, NOSE, AND THROAT: No tinnitus or ear pain.  RESPIRATORY: No cough, shortness of breath, wheezing or hemoptysis.  CARDIOVASCULAR: No chest pain, orthopnea, edema.  GASTROINTESTINAL: No nausea, vomiting, diarrhea or abdominal pain.  GENITOURINARY: No dysuria, hematuria.  ENDOCRINE: No polyuria, nocturia,  HEMATOLOGY: No anemia, easy bruising or bleeding SKIN: No rash or lesion. MUSCULOSKELETAL: No joint pain or arthritis.   NEUROLOGIC: No tingling, numbness, weakness.  PSYCHIATRY: No anxiety or depression.   MEDICATIONS AT HOME:   Prior to Admission medications   Medication Sig Start Date End Date Taking? Authorizing Provider  albuterol (PROVENTIL) (2.5 MG/3ML) 0.083% nebulizer solution Take 3 mLs (2.5 mg total) by nebulization every 8 (eight) hours as needed. Shortness of breath and wheezing 02/19/16  Yes Arlis Porta., MD  apixaban (ELIQUIS) 5 MG TABS tablet Take 1 tablet (5 mg total) by mouth 2 (two) times daily. 10/30/15  Yes Wieting, [name], MD  bisacodyl (DULCOLAX) 5 MG EC tablet Take 2 tablets (10 mg total) by mouth daily as needed for moderate constipation. 05/12/17 08/14/17 Yes Milinda Pointer, MD  Cholecalciferol (VITAMIN D) 2000 units tablet Take 2,000 Units by mouth daily.    Yes [provider]  furosemide (LASIX) 20 MG tablet Take 1 tablet (20 mg total) by mouth daily. 04/12/17  Yes Mikey College, NP  levocetirizine (XYZAL) 5 MG tablet Take 1 tablet (5 mg total) by mouth every evening. 06/22/16  Yes Arlis Porta., MD  lisinopril (PRINIVIL,ZESTRIL) 10 MG tablet Take 5 mg by mouth daily.   Yes [provider]  Melatonin 10 MG CAPS Take 20 mg by mouth at bedtime as needed. 08/09/17  Yes Vevelyn Francois, NP  Oxycodone HCl 20 MG TABS Take 1 tablet (20 mg total) by mouth every 8 (eight) hours as needed. 10/09/17 11/08/17 Yes Vevelyn Francois, NP  OXYGEN Inhale 2 L into the lungs at bedtime.    Yes [provider]  polyethylene  glycol (MIRALAX / GLYCOLAX) packet Take 17 g by mouth daily as needed for moderate constipation. 05/12/17 08/14/17 Yes Milinda Pointer, MD  rOPINIRole (REQUIP) 2 MG tablet Take 1 tablet (2 mg total) by mouth at bedtime. 05/12/17 08/14/17 Yes Milinda Pointer, MD  Oxycodone HCl 20 MG TABS Take 1 tablet (20 mg total) by mouth every 8 (eight) hours as needed. 09/09/17 10/09/17  Vevelyn Francois, NP  Oxycodone HCl 20 MG TABS Take 1 tablet (20 mg total) by mouth every 8 (eight) hours as needed. 08/10/17 09/09/17  Vevelyn Francois, NP      VITAL SIGNS:  Blood  pressure (!) 132/56, pulse (!) 53, temperature 97.9 F (36.6 C), resp. rate 16, height 5\' 8"  (1.727 m), weight 70.8 kg (156 lb), SpO2 91 %.  PHYSICAL EXAMINATION:  GENERAL:  83 y.o.-year-old patient lying in the bed with no acute distress.  EYES: Pupils equal, round, reactive to light and accommodation. No scleral icterus. Extraocular muscles intact.  HEENT: Head atraumatic, normocephalic. Oropharynx and nasopharynx clear. Dry mucous membranes NECK:  Supple, no jugular venous distention. No thyroid enlargement, no tenderness.  LUNGS: Normal breath sounds bilaterally, no wheezing, rales,rhonchi or crepitation. No use of accessory muscles of respiration.  CARDIOVASCULAR: S1, S2 normal. No murmurs, rubs, or gallops.  ABDOMEN: Soft, nontender, nondistended. Bowel sounds present. No organomegaly or mass.  EXTREMITIES: No pedal edema, cyanosis, or clubbing.  NEUROLOGIC: Cranial nerves II through XII are intact. Muscle strength 5/5 in all extremities. Sensation intact. Gait not checked.  PSYCHIATRIC: The patient is alert and oriented x 3.  SKIN: No obvious rash, lesion, or ulcer.   LABORATORY PANEL:   CBC Recent Labs  Lab 08/14/17 1320  WBC 5.9  HGB 12.0  HCT 37.1  PLT 150   ------------------------------------------------------------------------------------------------------------------  Chemistries  Recent Labs  Lab 08/14/17 1320  NA  135  K 4.8  CL 109  CO2 19*  GLUCOSE 88  BUN 76*  CREATININE 2.09*  CALCIUM 9.6   ------------------------------------------------------------------------------------------------------------------  Cardiac Enzymes Recent Labs  Lab 08/14/17 1124  TROPONINI <0.03   ------------------------------------------------------------------------------------------------------------------  RADIOLOGY:  No results found.  EKG:   Orders placed or performed during the hospital encounter of 08/14/17  . ED EKG  . ED EKG  . ED EKG  . ED EKG  . EKG 12-Lead  . EKG 12-Lead    IMPRESSION AND PLAN:   Kristin [name]  is a 83 y.o. female with a known history of chronic kidney disease but creatinine was normal in May 2018, chronic atrial fibrillation, history of breast cancer, congestive heart failure, essential hypertension and multiple other medical problems is presenting to the ED with a chief complaint of worsening of the weakness associated with dizziness   #Acute kidney injury on chronic kidney disease from poor by mouth intake-prerenal Admitted to MedSurg unit Encourage by mouth intake IV fluids with close monitoring for symptoms and signs of fluid overload as patient has history of congestive heart failure Avoid nephrotoxins Holding Lasix and lisinopril tonight Previous creatinine in May 2018 was normal Check orthostatics   #Essential hypertension Blood pressure is stable Holding lisinopril, start patient on metoprolol while holding lisinopril  #Chronic history of atrial fibrillation rate controlled Not on any rate limiting drugs Continue home medication eliquis  #Restless leg syndrome continue Requip  # chronic pain syndrome continue oxycodone her home medication  #Insomnia, continue melatonin  #Generalized weakness secondary to dehydration Providing IV fluids, PT consult is ordered  GI prophylaxis with Protonix DVT prophylaxis-eliquis   All the records are reviewed and  case discussed with ED provider. Management plans discussed with the patient, family and they are in agreement.  CODE STATUS: Fc , granddaughter [name] is the healthcare power of attorney  TOTAL TIME TAKING CARE OF THIS PATIENT: 43 minutes.   Note: This dictation was prepared with Dragon dictation along with smaller phrase technology. Any transcriptional errors that result from this process are unintentional.  Nicholes Mango M.D on 08/14/2017 at 3:57 PM  Between 7am to 6pm - Pager - [phone]  After 6pm go to [email or web address] - password [facility] psychotherap[facility]  Office  [phone]  CC: Primary care physician; Mikey College, NP

## 2017-08-14 NOTE — ED Notes (Signed)
Kristin [name] in with pt to try for ultrasound iv

## 2017-08-14 NOTE — Plan of Care (Signed)
New admission from ED accompanied by her granddgt with reported increased weakness over last few days. Orthostatic vs's indicate orthostatic. Up to BR with assistance and tolerated well. IVF's started. High risk fall precautions initiated with education done. Pt resting quietly at this time. Unable to tolerate metoprolol with dose held.

## 2017-08-14 NOTE — ED Triage Notes (Signed)
Pt arrived via EMS from home with reports of increased fatigue over the past few days, worse today, Denies any N/V/D reports dizziness and lightheadedness.  Denies any pain. Pt has pacemaker was paced rhythm with EMS   2 IV attempts made with EMS unsuccessful.

## 2017-08-15 DIAGNOSIS — N179 Acute kidney failure, unspecified: Secondary | ICD-10-CM | POA: Diagnosis not present

## 2017-08-15 DIAGNOSIS — R531 Weakness: Secondary | ICD-10-CM | POA: Diagnosis not present

## 2017-08-15 LAB — CBC
HCT: 34.8 % — ABNORMAL LOW (ref 35.0–47.0)
Hemoglobin: 11.6 g/dL — ABNORMAL LOW (ref 12.0–16.0)
MCH: 30.8 pg (ref 26.0–34.0)
MCHC: 33.4 g/dL (ref 32.0–36.0)
MCV: 92.2 fL (ref 80.0–100.0)
Platelets: 139 K/uL — ABNORMAL LOW (ref 150–440)
RBC: 3.78 MIL/uL — ABNORMAL LOW (ref 3.80–5.20)
RDW: 14 % (ref 11.5–14.5)
WBC: 5.7 K/uL (ref 3.6–11.0)

## 2017-08-15 LAB — BASIC METABOLIC PANEL WITH GFR
Anion gap: 3 — ABNORMAL LOW (ref 5–15)
BUN: 59 mg/dL — ABNORMAL HIGH (ref 6–20)
CO2: 19 mmol/L — ABNORMAL LOW (ref 22–32)
Calcium: 8.9 mg/dL (ref 8.9–10.3)
Chloride: 114 mmol/L — ABNORMAL HIGH (ref 101–111)
Creatinine, Ser: 1.41 mg/dL — ABNORMAL HIGH (ref 0.44–1.00)
GFR calc Af Amer: 38 mL/min — ABNORMAL LOW (ref >60)
GFR calc non Af Amer: 33 mL/min — ABNORMAL LOW (ref >60)
Glucose, Bld: 80 mg/dL (ref 65–99)
Potassium: 4.3 mmol/L (ref 3.5–5.1)
Sodium: 136 mmol/L (ref 135–145)

## 2017-08-15 MED ORDER — SODIUM CHLORIDE 0.9 % IV BOLUS (SEPSIS)
1000.0000 mL | Freq: Once | INTRAVENOUS | Status: AC
  Administered 2017-08-15: 1000 mL via INTRAVENOUS

## 2017-08-15 NOTE — Progress Notes (Signed)
[place] at [place] NAME: Kristin [name]    MR#:  [id number]  DATE OF BIRTH:  24-Oct-1929  SUBJECTIVE:   Pt. here due to weakness and noted to be in acute kidney injury. Creatinine has improved with IV fluids but continues to be hypotensive and orthostatic this morning.  REVIEW OF SYSTEMS:    Review of Systems  Constitutional: Negative for chills and fever.  HENT: Negative for congestion and tinnitus.   Eyes: Negative for blurred vision and double vision.  Respiratory: Negative for cough, shortness of breath and wheezing.   Cardiovascular: Negative for chest pain, orthopnea and PND.  Gastrointestinal: Negative for abdominal pain, diarrhea, nausea and vomiting.  Genitourinary: Negative for dysuria and hematuria.  Neurological: Negative for dizziness, sensory change and focal weakness.  All other systems reviewed and are negative.   Nutrition: Heart Healthy Tolerating Diet: Yes Tolerating PT: await eval.    DRUG ALLERGIES:   Allergies  Allergen Reactions  . Flexeril [Cyclobenzaprine Hcl]     Severe Rash  . Vancomycin Rash    Red Mans Syndrome    VITALS:  Blood pressure (!) 91/54, pulse 68, temperature 97.7 F (36.5 C), temperature source Oral, resp. rate 18, height 5\' 8"  (1.727 m), weight 68.9 kg (152 lb), SpO2 98 %.  PHYSICAL EXAMINATION:   Physical Exam  GENERAL:  83 y.o.-year-old patient sitting up in chair in no acute distress.  EYES: Pupils equal, round, reactive to light and accommodation. No scleral icterus. Extraocular muscles intact.  HEENT: Head atraumatic, normocephalic. Oropharynx and nasopharynx clear.  NECK:  Supple, no jugular venous distention. No thyroid enlargement, no tenderness.  LUNGS: Normal breath sounds bilaterally, no wheezing, rales, rhonchi. No use of accessory muscles of respiration.  CARDIOVASCULAR: S1, S2 normal. No murmurs, rubs, or gallops.  ABDOMEN: Soft, nontender, nondistended. Bowel sounds  present. No organomegaly or mass.  EXTREMITIES: No cyanosis, clubbing or edema b/l.    NEUROLOGIC: Cranial nerves II through XII are intact. No focal Motor or sensory deficits b/l.   PSYCHIATRIC: The patient is alert and oriented x 3.  SKIN: No obvious rash, lesion, or ulcer.    LABORATORY PANEL:   CBC Recent Labs  Lab 08/15/17 0502  WBC 5.7  HGB 11.6*  HCT 34.8*  PLT 139*   ------------------------------------------------------------------------------------------------------------------  Chemistries  Recent Labs  Lab 08/15/17 0502  NA 136  K 4.3  CL 114*  CO2 19*  GLUCOSE 80  BUN 59*  CREATININE 1.41*  CALCIUM 8.9   ------------------------------------------------------------------------------------------------------------------  Cardiac Enzymes Recent Labs  Lab 08/14/17 1124  TROPONINI <0.03   ------------------------------------------------------------------------------------------------------------------  RADIOLOGY:  No results found.   ASSESSMENT AND PLAN:   83 year old female with past medical history of essential hypertension, hepatitis C, COPD, history of CHF, atrial fibrillation, restless leg, previous history of urinary tract infection presented to the hospital due to weakness and noted to be in acute kidney injury.  1. Acute renal failure-secondary to dehydration and use of diuretics. -Patient's diuretics and lisinopril on hold. Renal function improved with some IV fluids and will continue to monitor. Renal dose meds, avoid nephrotoxins.  2. Orthostatic hypotension-continue IV fluid hydration, repeat orthostatics later this afternoon if improved will DC home later today. -Secondary to dehydration.  3. History of atrial fibrillation-rate controlled. Continue Eliquis.  4. Chronic pain-continue oxycodone as needed.  5. Restless leg syndrome-continue Requip.  6. History of CHF-this is chronic diastolic CHF. Clinically patient was not in heart  failure. -We'll resume  Lasix upon discharge.     All the records are reviewed and case discussed with Care Management/Social Worker. Management plans discussed with the patient, family and they are in agreement.  CODE STATUS: Full code  DVT Prophylaxis: Eliquis  TOTAL TIME TAKING CARE OF THIS PATIENT: 30 minutes.   POSSIBLE D/C IN 1-2 DAYS, DEPENDING ON CLINICAL CONDITION.   Henreitta Leber M.D on 08/15/2017 at 1:24 PM  Between 7am to 6pm - Pager - [phone]  After 6pm go to [email or web address] - Proofreader  [facility]  Office  [phone]  CC: Primary care physician; Mikey College, NP

## 2017-08-15 NOTE — Evaluation (Signed)
Physical Therapy Evaluation Patient Details Name: Kristin [name] MRN: [id number] DOB: 1929/09/17 Today's Date: 08/15/2017   History of Present Illness   83 y.o. female with a known history of chronic kidney disease but creatinine was normal in May 2018, chronic atrial fibrillation, history of breast cancer, congestive heart failure, essential hypertension and multiple other medical problems is presenting with chief complaint of worsening of the weakness associated with dizziness.  Admitted with AKI.  Clinical Impression  Pt did well with PT exam and overall did well with mobility acts a showed good safety with all tasks.  Pt made appropriate adjustments with gait training cues apart from the formal exam and he and son feel good about being able to go home safely w/o further PT intervention.  Will complete PT orders, pt appears to be at/near baseline.     Follow Up Recommendations No PT follow up    Equipment Recommendations       Recommendations for Other Services       Precautions / Restrictions Precautions Precautions: Fall Restrictions Weight Bearing Restrictions: No      Mobility  Bed Mobility Overal bed mobility: Modified Independent             General bed mobility comments: Pt able to get to EOB with only light use of rails, no physical assist  Transfers Overall transfer level: Independent Equipment used: Rolling walker (2 wheeled)             General transfer comment: Pt is able to rise to standing w/ only light reliance on the cane  Ambulation/Gait Ambulation/Gait assistance: Modified independent (Device/Increase time) Ambulation Distance (Feet): 200 Feet Assistive device: Straight cane       General Gait Details: Pt did well with the effort of prolonged ambulation.  No excessive fatigue (O2 remains in high 90s t/o the effort. Minimal cuing for cadence, speed and AD use but ultimately pt did very well, near baseline.   Stairs             Wheelchair Mobility    Modified Rankin (Stroke Patients Only)       Balance Overall balance assessment: Modified Independent                                           Pertinent Vitals/Pain Pain Assessment: No/denies pain    Home Living Family/patient expects to be discharged to:: Private residence Living Arrangements: Children Available Help at Discharge: Family   Home Access: Level entry       Home Equipment: Cane - single point      Prior Function Level of Independence: Independent with assistive device(s)         Comments: Pt's son reports that he takes her out regularly and that she can be active in the community     Hand Dominance        Extremity/Trunk Assessment   Upper Extremity Assessment Upper Extremity Assessment: Overall WFL for tasks assessed;Generalized weakness    Lower Extremity Assessment Lower Extremity Assessment: Overall WFL for tasks assessed;Generalized weakness       Communication   Communication: No difficulties  Cognition Arousal/Alertness: Awake/alert Behavior During Therapy: WFL for tasks assessed/performed Overall Cognitive Status: History of cognitive impairments - at baseline  General Comments      Exercises     Assessment/Plan    PT Assessment Patent does not need any further PT services  PT Problem List         PT Treatment Interventions      PT Goals (Current goals can be found in the Care Plan section)  Acute Rehab PT Goals Patient Stated Goal: go home PT Goal Formulation: All assessment and education complete, DC therapy    Frequency     Barriers to discharge        Co-evaluation               AM-PAC PT "6 Clicks" Daily Activity  Outcome Measure Difficulty turning over in bed (including adjusting bedclothes, sheets and blankets)?: None Difficulty moving from lying on back to sitting on the side of the bed? :  None Difficulty sitting down on and standing up from a chair with arms (e.g., wheelchair, bedside commode, etc,.)?: None Help needed moving to and from a bed to chair (including a wheelchair)?: None Help needed walking in hospital room?: None Help needed climbing 3-5 steps with a railing? : None 6 Click Score: 24    End of Session Equipment Utilized During Treatment: Gait belt Activity Tolerance: Patient tolerated treatment well Patient left: with bed alarm set   PT Visit Diagnosis: Muscle weakness (generalized) (M62.81);Difficulty in walking, not elsewhere classified (R26.2)    Time: 5300-5110 PT Time Calculation (min) (ACUTE ONLY): 28 min   Charges:   PT Evaluation $PT Eval Low Complexity: 1 Low PT Treatments $Gait Training: 8-22 mins   PT G Codes:   PT G-Codes **NOT FOR INPATIENT CLASS** Functional Assessment Tool Used: Clinical judgement Functional Limitation: Mobility: Walking and moving around Mobility: Walking and Moving Around Current Status (Y1117): At least 1 percent but less than 20 percent impaired, limited or restricted Mobility: Walking and Moving Around Goal Status [phone]): At least 1 percent but less than 20 percent impaired, limited or restricted Mobility: Walking and Moving Around Discharge Status [phone]): At least 1 percent but less than 20 percent impaired, limited or restricted    Kreg Shropshire, DPT 08/15/2017, 11:05 AM

## 2017-08-15 NOTE — Discharge Summary (Signed)
[place] at [place] NAME: Kristin [name]    MR#:  [id number]  DATE OF BIRTH:  01-28-1930  DATE OF ADMISSION:  08/14/2017 ADMITTING PHYSICIAN: Nicholes Mango, MD  DATE OF DISCHARGE: 08/15/2017  PRIMARY CARE PHYSICIAN: Mikey College, NP    ADMISSION DIAGNOSIS:  Weakness [R53.1] Acute renal failure, unspecified acute renal failure type ([place]) [N17.9]  DISCHARGE DIAGNOSIS:  Active Problems:   AKI (acute kidney injury) ([place])   SECONDARY DIAGNOSIS:   Past Medical History:  Diagnosis Date  . Arrhythmia, sinus node 04/03/2014  . Arthritis   . Arthritis   . Atrial fibrillation ([place])   . Back pain    lower back chronic  . Bradycardia   . Breast cancer ([place]) 2004   right breast cancer  . Cancer [facility]) 2004   rt breast cancer-post lumpectomy- chemo/rad  . CHF (congestive heart failure) ([place])   . Chronic back pain   . CKD (chronic kidney disease)   . COPD (chronic obstructive pulmonary disease) ([facility])    wears O2 at 2L via [place] at night  . Cystocele   . Decubitus ulcers   . Gout   . Hematuria   . Hepatitis C   . Hyperlipidemia   . Hypertension   . Hypomagnesemia 06/25/2015  . OAB (overactive bladder)   . Overactive bladder   . Restless leg   . Shoulder pain, bilateral   . Urinary frequency   . UTI (lower urinary tract infection)   . Vaginal atrophy     HOSPITAL COURSE:   83 year old female with past medical history of essential hypertension, hepatitis C, COPD, history of CHF, atrial fibrillation, restless leg, previous history of urinary tract infection presented to the hospital due to weakness and noted to be in acute kidney injury.  1. Acute renal failure-secondary to dehydration and use of diuretics. -Patient's diuretics and lisinopril were hold. Renal function improved with some IV fluids and is currently close to baseline.  - Renal function can be followed as outpatient.   2. Orthostatic hypotension-has improved and  resolved with IV fluid hydration. Patient was advised to hold her antihypertensives for a few more days and check her blood pressure at home and resume her blood pressure meds after further discussion with a primary care physician.  3. History of atrial fibrillation-rate controlled. She will Continue Eliquis.  4. Chronic pain- she will continue oxycodone as needed.  5. Restless leg syndrome-she will continue Requip.  6. History of CHF-this is chronic diastolic CHF. Clinically patient was not in heart failure while in the hospital. -pt will resume her Lasix, Lisinopril in the next 2-3 days.      DISCHARGE CONDITIONS:   Stable.   CONSULTS OBTAINED:    DRUG ALLERGIES:   Allergies  Allergen Reactions  . Flexeril [Cyclobenzaprine Hcl]     Severe Rash  . Vancomycin Rash    Red Mans Syndrome    DISCHARGE MEDICATIONS:   Allergies as of 08/15/2017      Reactions   Flexeril [cyclobenzaprine Hcl]    Severe Rash   Vancomycin Rash   Red Mans Syndrome      Medication List    TAKE these medications   albuterol (2.5 MG/3ML) 0.083% nebulizer solution Commonly known as:  PROVENTIL Take 3 mLs (2.5 mg total) by nebulization every 8 (eight) hours as needed. Shortness of breath and wheezing   apixaban 5 MG Tabs tablet Commonly known as:  ELIQUIS Take 1 tablet (5 mg  total) by mouth 2 (two) times daily.   bisacodyl 5 MG EC tablet Commonly known as:  DULCOLAX Take 2 tablets (10 mg total) by mouth daily as needed for moderate constipation.   furosemide 20 MG tablet Commonly known as:  LASIX Take 1 tablet (20 mg total) by mouth daily.   levocetirizine 5 MG tablet Commonly known as:  XYZAL Take 1 tablet (5 mg total) by mouth every evening.   lisinopril 10 MG tablet Commonly known as:  PRINIVIL,ZESTRIL Take 5 mg by mouth daily.   Melatonin 10 MG Caps Take 20 mg by mouth at bedtime as needed.   Oxycodone HCl 20 MG Tabs Take 1 tablet (20 mg total) by mouth every 8 (eight)  hours as needed.   Oxycodone HCl 20 MG Tabs Take 1 tablet (20 mg total) by mouth every 8 (eight) hours as needed. Start taking on:  09/09/2017   Oxycodone HCl 20 MG Tabs Take 1 tablet (20 mg total) by mouth every 8 (eight) hours as needed. Start taking on:  10/09/2017   OXYGEN Inhale 2 L into the lungs at bedtime.   polyethylene glycol packet Commonly known as:  MIRALAX / GLYCOLAX Take 17 g by mouth daily as needed for moderate constipation.   rOPINIRole 2 MG tablet Commonly known as:  REQUIP Take 1 tablet (2 mg total) by mouth at bedtime.   Vitamin D 2000 units tablet Take 2,000 Units by mouth daily.         DISCHARGE INSTRUCTIONS:   DIET:  Cardiac diet  DISCHARGE CONDITION:  Stable  ACTIVITY:  Activity as tolerated  OXYGEN:  Home Oxygen: No.   Oxygen Delivery: room air  DISCHARGE LOCATION:  home   If you experience worsening of your admission symptoms, develop shortness of breath, life threatening emergency, suicidal or homicidal thoughts you must seek medical attention immediately by calling 911 or calling your MD immediately  if symptoms less severe.  You Must read complete instructions/literature along with all the possible adverse reactions/side effects for all the Medicines you take and that have been prescribed to you. Take any new Medicines after you have completely understood and accpet all the possible adverse reactions/side effects.   Please note  You were cared for by a hospitalist during your hospital stay. If you have any questions about your discharge medications or the care you received while you were in the hospital after you are discharged, you can call the unit and asked to speak with the hospitalist on call if the hospitalist that took care of you is not available. Once you are discharged, your primary care physician will handle any further medical issues. Please note that NO REFILLS for any discharge medications will be authorized once you are  discharged, as it is imperative that you return to your primary care physician (or establish a relationship with a primary care physician if you do not have one) for your aftercare needs so that they can reassess your need for medications and monitor your lab values.   DATA REVIEW:   CBC Recent Labs  Lab 08/15/17 0502  WBC 5.7  HGB 11.6*  HCT 34.8*  PLT 139*    Chemistries  Recent Labs  Lab 08/15/17 0502  NA 136  K 4.3  CL 114*  CO2 19*  GLUCOSE 80  BUN 59*  CREATININE 1.41*  CALCIUM 8.9    Cardiac Enzymes Recent Labs  Lab 08/14/17 1124  [place] <0.03    Microbiology Results  Results for orders  placed or performed in visit on 11/08/16  Urine Culture     Status: None   Collection Time: 11/08/16 12:00 AM  Result Value Ref Range Status   Urine Culture, Routine Final report  Final   Organism ID, Bacteria Comment  Final    Comment: Culture shows less than 10,000 colony forming units of bacteria per milliliter of urine. This colony count is not generally considered to be clinically significant.     RADIOLOGY:  No results found.    Management plans discussed with the patient, family and they are in agreement.  CODE STATUS:     Code Status Orders  (From admission, onward)        Start     Ordered   08/14/17 1649  Full code  Continuous     08/14/17 1648    Advance Directive Documentation     Most Recent Value  Type of Advance Directive  Healthcare Power of Attorney, Living will  Pre-existing out of facility DNR order (yellow form or pink MOST form)  No data  "MOST" Form in Place?  No data      TOTAL TIME TAKING CARE OF THIS PATIENT: 40 minutes.    Henreitta Leber M.D on 08/15/2017 at 2:53 PM  Between 7am to 6pm - Pager - [phone]  After 6pm go to [email or web address] - Proofreader  [facility]  Office  [phone]  CC: Primary care physician; Mikey College, NP

## 2017-08-15 NOTE — Plan of Care (Signed)
Pt admitted for weakness and acute kidney injury.  Kidney function improved on IV fluids. BUN improved from 76 to 59 today; Creat improved from 2.09 to 1.41; GFR improved from 24-38.  She has been hypotensive and metoprolol has been held.  Not sure she's on metoprolol at home.  Dr. Verdell Carmine wants pt to hold lasix and lisinopril for a few days.  Has instructed her to check her BP at home and to not take if SBP < 100.  Pt has Chronic AFib.  CCMD has called several times today concerned abt her HR.  She has a Pacemaker and was told the pacer didn't catch at 0705 or 0707 and the rate may be down in the 50s before it paces.   Pt's BP and orthostatics continue to be low.  We're giving her a bolus of fluid and then will reassess VS and d/c.

## 2017-08-15 NOTE — Progress Notes (Signed)
After the fluid bolus, pt's orthostatic vitals were taken and pt was deemed safe to discharge home.  Reiterated to hold lisinopril and lasix.  Patient's family are transporting her home.

## 2017-08-15 NOTE — Care Management Note (Signed)
Case Management Note  Patient Details  Name: Kristin [name] MRN: [id number] Date of Birth: [birth year]  Subjective/Objective:      Admitted to [facility] with the diagnosis of acute kidney injury. Lives with son Kristin [name]. [name] [phone]). Last seen Dr., Merrilyn Puma 07/20/17. Prescriptions are filled at [facility] in [place].  Home Health per [facility] in the past. No skilled facility. Home oxygen per [facility] for many years. Only uses oxygen at night. Takes care of all basic activities of daily living herself, drives. Cane and nebulizer in the home. No falls. Lost weight in the past to not get diabetes. Family will transport             Action/Plan: Physical therapy evaluation completed. No recommendations.    Expected Discharge Date:  08/15/17               Expected Discharge Plan:     In-House Referral:   yes  Discharge planning Services     Post Acute Care Choice:    Choice offered to:     DME Arranged:    DME Agency:     HH Arranged:    HH Agency:     Status of Service:     If discussed at [employer], dates discussed:    Additional Comments:  Shelbie Ammons, RN MSN CCM Care Management [phone] 08/15/2017, 1:28 PM

## 2017-08-17 ENCOUNTER — Telehealth: Payer: Self-pay

## 2017-08-17 NOTE — Telephone Encounter (Signed)
Transition Care Management Follow-up Telephone Call   Date discharged? 08/15/17  How have you been since you were released from the hospital? "Feeling a lot better"   Do you understand why you were in the hospital?yes   Do you understand the discharge instructions? yes   Where were you discharged to? home   Items Reviewed:  Medications reviewed: yes  Allergies reviewed: yes  Dietary changes reviewed: yes  Referrals reviewed: yes   Functional Questionnaire:   Activities of Daily Living (ADLs):   She states they are independent in the following: ambulation, bathing and hygiene, feeding, continence, grooming, toileting and dressing States they require assistance with the following: none   Any transportation issues/concerns?: no   Any patient concerns? no   Confirmed importance and date/time of follow-up visits scheduled yes  Provider Appointment booked with [name], NP on 08/19/2017 at 4:00pm  Confirmed with patient if condition begins to worsen call PCP or go to the ER.  Patient was given the office number and encouraged to call back with question or concerns.  : yes

## 2017-08-19 ENCOUNTER — Encounter: Payer: Self-pay | Admitting: Nurse Practitioner

## 2017-08-19 ENCOUNTER — Other Ambulatory Visit: Payer: Self-pay

## 2017-08-19 ENCOUNTER — Ambulatory Visit (INDEPENDENT_AMBULATORY_CARE_PROVIDER_SITE_OTHER): Payer: Medicare Other | Admitting: Nurse Practitioner

## 2017-08-19 VITALS — BP 122/60 | HR 61 | Temp 98.8°F | Ht 68.0 in | Wt 157.4 lb

## 2017-08-19 DIAGNOSIS — J431 Panlobular emphysema: Secondary | ICD-10-CM | POA: Diagnosis not present

## 2017-08-19 DIAGNOSIS — J301 Allergic rhinitis due to pollen: Secondary | ICD-10-CM

## 2017-08-19 DIAGNOSIS — I5032 Chronic diastolic (congestive) heart failure: Secondary | ICD-10-CM

## 2017-08-19 DIAGNOSIS — N179 Acute kidney failure, unspecified: Secondary | ICD-10-CM

## 2017-08-19 MED ORDER — LEVOCETIRIZINE DIHYDROCHLORIDE 5 MG PO TABS: 5.0000 mg | ORAL_TABLET | Freq: Every evening | ORAL | 12 refills | Status: DC

## 2017-08-19 MED ORDER — FUROSEMIDE 20 MG PO TABS: 10.0000 mg | ORAL_TABLET | Freq: Every day | ORAL | 1 refills | Status: DC

## 2017-08-19 MED ORDER — ALBUTEROL SULFATE (2.5 MG/3ML) 0.083% IN NEBU: 2.5000 mg | INHALATION_SOLUTION | Freq: Three times a day (TID) | RESPIRATORY_TRACT | 12 refills | Status: DC | PRN

## 2017-08-19 NOTE — Progress Notes (Signed)
Subjective:    [name], female    DOB: 1929-09-11, 83 y.o.   MRN: [id number]  Kristin [name] is a 83 y.o. female presenting on 08/19/2017 for Hospitalization Follow-up (acute renal failure, pt diuretic and Lisinopril was discontinued )   HPI HOSPITAL FOLLOW-UP VISIT Hospital/Location: [facility] Acute Renal Failure, Weakness Date of Admission: 08/14/2017  Date of Discharge: 08/15/2017 Transitions of care telephone call: 08/17/17  Reason for Admission: Acute renal failure, Weakness Primary (+Secondary) Diagnosis: Acute kidney injury  - Hospital H&P and Discharge Summary have been reviewed - Patient presents today, days after recent hospitalization. Brief summary of recent course, patient had symptoms of weakness after several days of reduce po intake. Held diuretics and lisinopril.  Pt was provided IV fluids with improvement of renal function. Plan to monitor renal function by PCP and restart Hypertension medications as able.  - Today reports overall has done well after discharge. Symptoms of weakness are beginning to resolve, but strength is slow to return.  - New medications on discharge: none - Changes to current meds on discharge: stopped lisinopril and furosemide.  Prior to hospitalization, pt reports taking furosemide 40 mg tablet (1/2 tablet) daily and lisinopril 10 mg tablet once daily despite med list stating pt was to take lisinopril 5 mg once daily.  Patient is also accompanied today by her granddaughter.  She reports pt has had poor po intake of food and water.  Has encouraged pt to have at least 3 meals and drink three 32 oz bottles of water daily.    Current daily intake: Appetite: late breakfast 10a - cereal w/ banana or bacon and toast, coffee w/ cream and sugar No lunch Dinner 6-7p - Meat and 2 vegetables. 1/2 chicken breast Water: estimate of 48 oz water is consumed daily  Social History   Tobacco Use  . Smoking status: Former Smoker    Packs/day: 1.25     Years: 40.00    Pack years: 50.00    Types: Cigarettes    Last attempt to quit: 12/16/1998    Years since quitting: 18.6  . Smokeless tobacco: Never Used  Substance Use Topics  . Alcohol use: No    Alcohol/week: 0.0 oz  . Drug use: No    Review of Systems Per HPI unless specifically indicated above  Outpatient Encounter Medications as of 08/19/2017  Medication Sig Note  . albuterol (PROVENTIL) (2.5 MG/3ML) 0.083% nebulizer solution Take 3 mLs (2.5 mg total) by nebulization every 8 (eight) hours as needed. Shortness of breath and wheezing   . apixaban (ELIQUIS) 5 MG TABS tablet Take 1 tablet (5 mg total) by mouth 2 (two) times daily.   . Cholecalciferol (VITAMIN D) 2000 units tablet Take 2,000 Units by mouth daily.    Marland Kitchen levocetirizine (XYZAL) 5 MG tablet Take 1 tablet (5 mg total) by mouth every evening.   . Melatonin 10 MG CAPS Take 20 mg by mouth at bedtime as needed.   Derrill Memo ON 10/09/2017] Oxycodone HCl 20 MG TABS Take 1 tablet (20 mg total) by mouth every 8 (eight) hours as needed.   . OXYGEN Inhale 2 L into the lungs at bedtime.    . [DISCONTINUED] albuterol (PROVENTIL) (2.5 MG/3ML) 0.083% nebulizer solution Take 3 mLs (2.5 mg total) by nebulization every 8 (eight) hours as needed. Shortness of breath and wheezing   . [DISCONTINUED] levocetirizine (XYZAL) 5 MG tablet Take 1 tablet (5 mg total) by mouth every evening.   . bisacodyl (  DULCOLAX) 5 MG EC tablet Take 2 tablets (10 mg total) by mouth daily as needed for moderate constipation.   . furosemide (LASIX) 20 MG tablet Take 0.5 tablets (10 mg total) by mouth daily.   Marland Kitchen lisinopril (PRINIVIL,ZESTRIL) 10 MG tablet Take 5 mg by mouth daily.   Derrill Memo ON 09/09/2017] Oxycodone HCl 20 MG TABS Take 1 tablet (20 mg total) by mouth every 8 (eight) hours as needed. (Patient not taking: Reported on 08/19/2017)   . Oxycodone HCl 20 MG TABS Take 1 tablet (20 mg total) by mouth every 8 (eight) hours as needed. (Patient not taking: Reported  on 08/19/2017)   . polyethylene glycol (MIRALAX / GLYCOLAX) packet Take 17 g by mouth daily as needed for moderate constipation.   Marland Kitchen rOPINIRole (REQUIP) 2 MG tablet Take 1 tablet (2 mg total) by mouth at bedtime. 05/13/2017: DO NOT DELETE, even if Expired!!! See Dr. Adalberto Cole care coordination note.  . [DISCONTINUED] furosemide (LASIX) 20 MG tablet Take 1 tablet (20 mg total) by mouth daily. (Patient not taking: Reported on 08/19/2017)    No facility-administered encounter medications on file as of 08/19/2017.       Objective:    BP 122/60 (BP Location: Right Arm, Patient Position: Sitting, Cuff Size: Normal)   Pulse 61   Temp 98.8 F (37.1 C) (Oral)   Ht 5\' 8"  (1.727 m)   Wt 157 lb 6.4 oz (71.4 kg)   BMI 23.93 kg/m   Wt Readings from Last 3 Encounters:  08/19/17 157 lb 6.4 oz (71.4 kg)  08/14/17 152 lb (68.9 kg)  08/09/17 156 lb (70.8 kg)    Physical Exam General - healthy, well-appearing, NAD HEENT - Normocephalic, atraumatic Neck - supple, non-tender, no LAD, no thyromegaly, no carotid bruit Heart - RRR, no murmurs heard Lungs - Clear throughout all lobes, no wheezing, crackles, or rhonchi. Normal work of breathing. Extremeties - non-tender, trace pedal edema, cap refill < 2 seconds, peripheral pulses intact +2 bilaterally, 4/5 strength upper and lower extremities Skin - warm, dry Neuro - awake, alert, oriented x3, normal gait Psych - Normal mood and affect, normal behavior    Results for orders placed or performed during the hospital encounter of 71/21/97  Basic metabolic panel  Result Value Ref Range   Sodium 135 135 - 145 mmol/L   Potassium 4.8 3.5 - 5.1 mmol/L   Chloride 109 101 - 111 mmol/L   CO2 19 (L) 22 - 32 mmol/L   Glucose, Bld 88 65 - 99 mg/dL   BUN 76 (H) 6 - 20 mg/dL   Creatinine, Ser 2.09 (H) 0.44 - 1.00 mg/dL   Calcium 9.6 8.9 - 10.3 mg/dL   GFR calc non Af Amer 20 (L) >60 mL/min   GFR calc Af Amer 24 (L) >60 mL/min   Anion gap 7 5 - 15  CBC  Result  Value Ref Range   WBC 5.9 3.6 - 11.0 K/uL   RBC 3.96 3.80 - 5.20 MIL/uL   Hemoglobin 12.0 12.0 - 16.0 g/dL   HCT 37.1 35.0 - 47.0 %   MCV 93.7 80.0 - 100.0 fL   MCH 30.2 26.0 - 34.0 pg   MCHC 32.3 32.0 - 36.0 g/dL   RDW 14.5 11.5 - 14.5 %   Platelets 150 150 - 440 K/uL  Urinalysis, Complete w Microscopic  Result Value Ref Range   Color, Urine STRAW (A) YELLOW   APPearance CLEAR (A) CLEAR   Specific Gravity, Urine 1.006 1.005 -  1.030   pH 5.0 5.0 - 8.0   Glucose, UA NEGATIVE NEGATIVE mg/dL   Hgb urine dipstick SMALL (A) NEGATIVE   Bilirubin Urine NEGATIVE NEGATIVE   Ketones, ur NEGATIVE NEGATIVE mg/dL   Protein, ur NEGATIVE NEGATIVE mg/dL   Nitrite NEGATIVE NEGATIVE   Leukocytes, UA NEGATIVE NEGATIVE   RBC / HPF NONE SEEN 0 - 5 RBC/hpf   WBC, UA NONE SEEN 0 - 5 WBC/hpf   Bacteria, UA NONE SEEN NONE SEEN   Squamous Epithelial / LPF 0-5 (A) NONE SEEN   Mucus PRESENT   Glucose, capillary  Result Value Ref Range   Glucose-Capillary 74 65 - 99 mg/dL  Troponin I  Result Value Ref Range   Troponin I <0.03 <0.03 ng/mL  CBC  Result Value Ref Range   WBC 5.7 3.6 - 11.0 K/uL   RBC 3.78 (L) 3.80 - 5.20 MIL/uL   Hemoglobin 11.6 (L) 12.0 - 16.0 g/dL   HCT 34.8 (L) 35.0 - 47.0 %   MCV 92.2 80.0 - 100.0 fL   MCH 30.8 26.0 - 34.0 pg   MCHC 33.4 32.0 - 36.0 g/dL   RDW 14.0 11.5 - 14.5 %   Platelets 139 (L) 150 - 440 K/uL  Basic metabolic panel  Result Value Ref Range   Sodium 136 135 - 145 mmol/L   Potassium 4.3 3.5 - 5.1 mmol/L   Chloride 114 (H) 101 - 111 mmol/L   CO2 19 (L) 22 - 32 mmol/L   Glucose, Bld 80 65 - 99 mg/dL   BUN 59 (H) 6 - 20 mg/dL   Creatinine, Ser 1.41 (H) 0.44 - 1.00 mg/dL   Calcium 8.9 8.9 - 10.3 mg/dL   GFR calc non Af Amer 33 (L) >60 mL/min   GFR calc Af Amer 38 (L) >60 mL/min   Anion gap 3 (L) 5 - 15      Assessment & Plan:   Problem List Items Addressed This Visit      Cardiovascular and Mediastinum   Chronic diastolic CHF (congestive heart  failure) (HCC) Pt with stable HF and no currently active symptoms.  Pt not taking furosemide currently.  Plan: 1. Continue to hold furosemide.  If weight > 158 lbs, can resume lasix at 10 mg once.  Call HF clinic if any symptoms of edema, shortness of breath. 2. Continue to hold lisinopril.   Relevant Medications   furosemide (LASIX) 20 MG tablet     Respiratory   Chronic obstructive pulmonary disease (HCC) Stable today.  Pt continues to use albuterol as needed and uses daily levocetirizine.  Requests refill today.  Refills provided.   Relevant Medications   albuterol (PROVENTIL) (2.5 MG/3ML) 0.083% nebulizer solution   levocetirizine (XYZAL) 5 MG tablet    Other Visit Diagnoses    Acute kidney injury ([place])    -  Primary Pt with improved oral intake since leaving hospital.  Remains below food intake goal.  Plan: 1. Encouraged pt to eat at least 3 times daily, even if only small snacks for lunch. 2. Encouraged 2,000 mL or about 64 oz water daily. Discussed water goal with granddaughter.  Three 32 oz bottles is too much. 3. Recheck CMP for assessment of kidney function prior to resuming meds. 4. Reviewed when to call clinic for worsening HF symptoms.   Relevant Orders   Comprehensive metabolic panel   Chronic seasonal allergic rhinitis due to pollen     Pt requests refill.  Provided as above.   Relevant  Medications   levocetirizine (XYZAL) 5 MG tablet      Meds ordered this encounter  Medications  . furosemide (LASIX) 20 MG tablet    Sig: Take 0.5 tablets (10 mg total) by mouth daily.    Dispense:  15 tablet    Refill:  1    Order Specific Question:   Supervising Provider    Answer:   Olin Hauser [[id number]]  . albuterol (PROVENTIL) (2.5 MG/3ML) 0.083% nebulizer solution    Sig: Take 3 mLs (2.5 mg total) by nebulization every 8 (eight) hours as needed. Shortness of breath and wheezing    Dispense:  75 mL    Refill:  12    Order Specific Question:   Supervising  Provider    Answer:   Olin Hauser [[id number]]  . levocetirizine (XYZAL) 5 MG tablet    Sig: Take 1 tablet (5 mg total) by mouth every evening.    Dispense:  30 tablet    Refill:  12    Order Specific Question:   Supervising Provider    Answer:   Olin Hauser [[id number]]    I have reviewed the discharge medication list, and have reconciled the current and discharge medications today.  Follow up plan: Return in about 4 weeks (around 09/16/2017) for hypertension and kidneys.   [name], DNP, AGPCNP-BC Adult Gerontology Primary Care Nurse Practitioner [facility] 08/22/2017, 1:27 PM

## 2017-08-19 NOTE — Patient Instructions (Addendum)
Kristin [name], Thank you for coming in to clinic today.  1. Call [name] at the heart failure clinic if you are having any leg swelling, pants don't fit at the waist, or you are short of breath when you wouldn't normally be short of breath. - If your weight is higher than 158, start taking furosemide 10 mg once daily. - If your blood pressure is higher than 130/80, call [facility].   2. Labs at the medical mall at [facility] on Monday.  You can eat breakfast.   Please schedule a follow-up appointment with [name], AGNP. Return in about 4 weeks (around 09/16/2017) for hypertension and kidneys.  If you have any other questions or concerns, please feel free to call the clinic or send a message through [place]. You may also schedule an earlier appointment if necessary.  You will receive a survey after today's visit either digitally by e-mail or paper by [employer]. Your experiences and feedback matter to [name].  Please respond so we know how we are doing as we provide care for you.   [name], DNP, AGNP-BC Adult Gerontology Nurse Practitioner [place]

## 2017-08-22 ENCOUNTER — Other Ambulatory Visit
Admission: RE | Admit: 2017-08-22 | Discharge: 2017-08-22 | Disposition: A | Discharge status: Home / Self Care | Payer: Medicare Other | Source: Ambulatory Visit | Attending: Nurse Practitioner | Admitting: Nurse Practitioner

## 2017-08-22 ENCOUNTER — Encounter: Payer: Self-pay | Admitting: Nurse Practitioner

## 2017-08-22 DIAGNOSIS — N179 Acute kidney failure, unspecified: Secondary | ICD-10-CM | POA: Diagnosis present

## 2017-08-22 LAB — COMPREHENSIVE METABOLIC PANEL
ALT: 11 U/L — ABNORMAL LOW (ref 14–54)
AST: 23 U/L (ref 15–41)
Albumin: 3.6 g/dL (ref 3.5–5.0)
Alkaline Phosphatase: 60 U/L (ref 38–126)
Anion gap: 7 (ref 5–15)
BUN: 19 mg/dL (ref 6–20)
CO2: 25 mmol/L (ref 22–32)
Calcium: 9.7 mg/dL (ref 8.9–10.3)
Chloride: 107 mmol/L (ref 101–111)
Creatinine, Ser: 1.09 mg/dL — ABNORMAL HIGH (ref 0.44–1.00)
GFR calc Af Amer: 52 mL/min — ABNORMAL LOW (ref >60)
GFR calc non Af Amer: 45 mL/min — ABNORMAL LOW (ref >60)
Glucose, Bld: 73 mg/dL (ref 65–99)
Potassium: 4.9 mmol/L (ref 3.5–5.1)
Sodium: 139 mmol/L (ref 135–145)
Total Bilirubin: 0.5 mg/dL (ref 0.3–1.2)
Total Protein: 6.6 g/dL (ref 6.5–8.1)

## 2017-08-28 NOTE — Progress Notes (Signed)
[name], female    DOB: [birth year], 84 y.o.   MRN: [id number]  HPI Kristin [name] is a 84 y/o female with a history of atrial fibrillation, right breast cancer, chronic back pain, CKD, COPD, gout, hepatitis, hyperlipidemia, HTN, hypomagnesium and chronic heart failure  Reviewed echo report on 10/29/15 which showed an EF of 55-65% with moderate MR.   Admitted 08/14/17 due to dehydration. Diuretics were held and IV fluids were given. She was discharged the following day.    Patient presents today for a follow-up visit with a chief complaint of moderate fatigue upon minimal exertion. She says this has been chronic in nature having been present for several years. She has associated cough, shortness of breath, chronic back pain and bilateral shoulder pain. She denies any chest pain, edema, palpitations, dizziness or difficulty sleeping. Occasionally endorses difficulty in being able to buy food for herself.   Past Medical History:  Diagnosis Date  . Arrhythmia, sinus node 04/03/2014  . Arthritis   . Arthritis   . Atrial fibrillation ([place])   . Back pain    lower back chronic  . Bradycardia   . Breast cancer ([place]) 2004   right breast cancer  . Cancer [facility]) 2004   rt breast cancer-post lumpectomy- chemo/rad  . CHF (congestive heart failure) ([place])   . Chronic back pain   . CKD (chronic kidney disease)   . COPD (chronic obstructive pulmonary disease) (HCC)    wears O2 at 2L via [place] at night  . Cystocele   . Decubitus ulcers   . Gout   . Hematuria   . Hepatitis C   . Hyperlipidemia   . Hypertension   . Hypomagnesemia 06/25/2015  . OAB (overactive bladder)   . Overactive bladder   . Restless leg   . Shoulder pain, bilateral   . Urinary frequency   . UTI (lower urinary tract infection)   . Vaginal atrophy    Past Surgical History:  Procedure Laterality Date  . ABDOMINAL HYSTERECTOMY    . BACK SURGERY    . BREAST LUMPECTOMY Right 2004  . BREAST SURGERY     rt lumpectomy  .  CARPAL TUNNEL RELEASE    . CATARACT EXTRACTION W/PHACO  10/19/2011   Procedure: CATARACT EXTRACTION PHACO AND INTRAOCULAR LENS PLACEMENT (IOC);  Surgeon: Elta Guadeloupe T. Gershon Crane, MD;  Location: [facility];  Service: Ophthalmology;  Laterality: Right;  CDE:10.81  . CATARACT EXTRACTION W/PHACO  11/02/2011   Procedure: CATARACT EXTRACTION PHACO AND INTRAOCULAR LENS PLACEMENT (IOC);  Surgeon: Elta Guadeloupe T. Gershon Crane, MD;  Location: [facility];  Service: Ophthalmology;  Laterality: Left;  CDE 8.60  . CHOLECYSTECTOMY     3/18  . COLON SURGERY    . JOINT REPLACEMENT     bilateral TKA  . PACEMAKER INSERTION N/A 12/24/2015   Procedure: INSERTION PACEMAKER;  Surgeon: Isaias Cowman, MD;  Location: [facility] ORS;  Service: Cardiovascular;  Laterality: N/A;  . ROTATOR CUFF REPAIR     bilateral   Family History  Problem Relation Age of Onset  . Kidney disease Brother   . Heart disease Father   . Heart disease Mother   . Stroke Mother   . Cancer Sister   . Breast cancer Sister 51  . Breast cancer Paternal Aunt   . Anesthesia problems Neg Hx   . Hypotension Neg Hx   . Malignant hyperthermia Neg Hx   . Pseudochol deficiency Neg Hx    Social History   Tobacco Use  .  Smoking status: Former Smoker    Packs/day: 1.25    Years: 40.00    Pack years: 50.00    Types: Cigarettes    Last attempt to quit: 12/16/1998    Years since quitting: 18.7  . Smokeless tobacco: Never Used  Substance Use Topics  . Alcohol use: No    Alcohol/week: 0.0 oz   Allergies  Allergen Reactions  . Flexeril [Cyclobenzaprine Hcl]     Severe Rash  . Vancomycin Rash    [place] Syndrome   Prior to Admission medications   Medication Sig Start Date End Date Taking? Authorizing Provider  albuterol (PROVENTIL) (2.5 MG/3ML) 0.083% nebulizer solution Take 3 mLs (2.5 mg total) by nebulization every 8 (eight) hours as needed. Shortness of breath and wheezing 08/19/17  Yes Mikey College, NP  apixaban (ELIQUIS) 5 MG TABS tablet Take 1 tablet (5  mg total) by mouth 2 (two) times daily. 10/30/15  Yes Wieting, [name], MD  bisacodyl (DULCOLAX) 5 MG EC tablet Take 2 tablets (10 mg total) by mouth daily as needed for moderate constipation. 05/12/17 08/29/17 Yes Milinda Pointer, MD  Cholecalciferol (VITAMIN D) 2000 units tablet Take 2,000 Units by mouth daily.    Yes [provider]  furosemide (LASIX) 20 MG tablet Take 0.5 tablets (10 mg total) by mouth daily. 08/19/17  Yes Mikey College, NP  levocetirizine (XYZAL) 5 MG tablet Take 1 tablet (5 mg total) by mouth every evening. 08/19/17  Yes Mikey College, NP  lisinopril (PRINIVIL,ZESTRIL) 10 MG tablet Take 5 mg by mouth daily.   Yes [provider]  Melatonin 10 MG CAPS Take 20 mg by mouth at bedtime as needed. 08/09/17  Yes Vevelyn Francois, NP  Oxycodone HCl 20 MG TABS Take 1 tablet (20 mg total) by mouth every 8 (eight) hours as needed. 10/09/17 11/08/17 Yes Vevelyn Francois, NP  Oxycodone HCl 20 MG TABS Take 1 tablet (20 mg total) by mouth every 8 (eight) hours as needed. 09/09/17 10/09/17 Yes Vevelyn Francois, NP  Oxycodone HCl 20 MG TABS Take 1 tablet (20 mg total) by mouth every 8 (eight) hours as needed. 08/10/17 09/09/17 Yes King, Diona Foley, NP  OXYGEN Inhale 2 L into the lungs at bedtime.    Yes [provider]  polyethylene glycol (MIRALAX / GLYCOLAX) packet Take 17 g by mouth daily as needed for moderate constipation. 05/12/17 08/29/17 Yes Milinda Pointer, MD  rOPINIRole (REQUIP) 2 MG tablet Take 1 tablet (2 mg total) by mouth at bedtime. 05/12/17 08/29/17 Yes Milinda Pointer, MD   Review of Systems  Constitutional: Positive for fatigue. Negative for appetite change.  HENT: Positive for congestion (in the mornings). Negative for postnasal drip and sore throat.   Eyes: Negative.   Respiratory: Positive for cough (in the mornings) and shortness of breath (walking up stairs and walking long distantces). Negative for chest tightness.   Cardiovascular:  Negative for chest pain, palpitations and leg swelling.  Gastrointestinal: Negative for abdominal distention and abdominal pain.  Endocrine: Negative.   Genitourinary: Negative.   Musculoskeletal: Positive for arthralgias (both shoulders) and back pain. Negative for neck stiffness.  Skin: Negative.   Allergic/Immunologic: Negative.   Neurological: Negative for dizziness and light-headedness.  Hematological: Negative for adenopathy. Bruises/bleeds easily.  Psychiatric/Behavioral: Negative for dysphoric mood and sleep disturbance (sleeping on 1 pillow; wearing oxygen @ 2L at bedtime). The patient is not nervous/anxious.    Vitals:   08/29/17 1137  BP: 125/75  Pulse: 76  Resp: 18  SpO2: 100%  Weight: 161 lb 2 oz (73.1 kg)   Wt Readings from Last 3 Encounters:  08/29/17 161 lb 2 oz (73.1 kg)  08/19/17 157 lb 6.4 oz (71.4 kg)  08/14/17 152 lb (68.9 kg)    Lab Results  Component Value Date   CREATININE 1.09 (H) 08/22/2017   CREATININE 1.41 (H) 08/15/2017   CREATININE 2.09 (H) 08/14/2017    Physical Exam  Constitutional: She is oriented to person, place, and time. She appears well-developed and well-nourished.  HENT:  Head: Normocephalic and atraumatic.  Neck: Normal range of motion. Neck supple. No JVD present.  Cardiovascular: Normal rate and regular rhythm.  Pulmonary/Chest: Effort normal. She has no wheezes. She has no rales.  Abdominal: Soft. She exhibits no distension. There is no tenderness.  Musculoskeletal: She exhibits no edema or tenderness.  Neurological: She is alert and oriented to person, place, and time.  Skin: Skin is warm and dry.  Psychiatric: She has a normal mood and affect. Her behavior is normal. Thought content normal.  Nursing note and vitals reviewed.  Assessment & Plan:  1: Chronic heart failure with preserved ejection fraction- - NYHA class III - Euvolemic today - continues to weigh daily and says that her weight rose some over the holidays.  Reminded to call for an overnight weight gain of >2 pounds or a weekly weight gain of >5 pounds - Short of breath when walking up and down stairs to do laundry, but reports this has been an ongoing issue and is not a new occurrence.  - Saw cardiologist ([facility]) 08/04/17 - adding salt to her food and occasionally uses Mrs. Dash. Discussed the importance of using Mrs. Dash for seasoning in place of using salt.  - she says that due to finances, she tends to pick saltier foods to buy because they are the more inexpensive - using home monitor for pacemaker interrogation - Wearing oxygen at 2L at bedtime  - reports receiving her flu vaccine for this season   2: HTN-  - Checks BP at home - saw PCP Merrilyn Puma) last week  - BMP 08/22/17 reviewed and showed sodium 139, potassium 4.9 and GFR 52  Patient did not bring in a medication list.  Patient opts to not make a return appointment at this time. Advised her that she could call back at any time to schedule another appointment.

## 2017-08-29 ENCOUNTER — Ambulatory Visit: Payer: Medicare Other | Attending: Family | Admitting: Family

## 2017-08-29 ENCOUNTER — Encounter: Payer: Self-pay | Admitting: Family

## 2017-08-29 VITALS — BP 125/75 | HR 76 | Resp 18 | Wt 161.1 lb

## 2017-08-29 DIAGNOSIS — Z9049 Acquired absence of other specified parts of digestive tract: Secondary | ICD-10-CM | POA: Diagnosis not present

## 2017-08-29 DIAGNOSIS — G8929 Other chronic pain: Secondary | ICD-10-CM | POA: Insufficient documentation

## 2017-08-29 DIAGNOSIS — B1920 Unspecified viral hepatitis C without hepatic coma: Secondary | ICD-10-CM | POA: Insufficient documentation

## 2017-08-29 DIAGNOSIS — Z881 Allergy status to other antibiotic agents status: Secondary | ICD-10-CM | POA: Insufficient documentation

## 2017-08-29 DIAGNOSIS — Z7901 Long term (current) use of anticoagulants: Secondary | ICD-10-CM | POA: Insufficient documentation

## 2017-08-29 DIAGNOSIS — Z87891 Personal history of nicotine dependence: Secondary | ICD-10-CM | POA: Insufficient documentation

## 2017-08-29 DIAGNOSIS — E785 Hyperlipidemia, unspecified: Secondary | ICD-10-CM | POA: Insufficient documentation

## 2017-08-29 DIAGNOSIS — R5383 Other fatigue: Secondary | ICD-10-CM | POA: Diagnosis present

## 2017-08-29 DIAGNOSIS — I10 Essential (primary) hypertension: Secondary | ICD-10-CM

## 2017-08-29 DIAGNOSIS — I130 Hypertensive heart and chronic kidney disease with heart failure and stage 1 through stage 4 chronic kidney disease, or unspecified chronic kidney disease: Secondary | ICD-10-CM | POA: Diagnosis not present

## 2017-08-29 DIAGNOSIS — Z950 Presence of cardiac pacemaker: Secondary | ICD-10-CM | POA: Diagnosis not present

## 2017-08-29 DIAGNOSIS — Z853 Personal history of malignant neoplasm of breast: Secondary | ICD-10-CM | POA: Insufficient documentation

## 2017-08-29 DIAGNOSIS — J449 Chronic obstructive pulmonary disease, unspecified: Secondary | ICD-10-CM | POA: Diagnosis not present

## 2017-08-29 DIAGNOSIS — M549 Dorsalgia, unspecified: Secondary | ICD-10-CM | POA: Diagnosis not present

## 2017-08-29 DIAGNOSIS — Z79899 Other long term (current) drug therapy: Secondary | ICD-10-CM | POA: Diagnosis not present

## 2017-08-29 DIAGNOSIS — I5032 Chronic diastolic (congestive) heart failure: Secondary | ICD-10-CM | POA: Diagnosis not present

## 2017-08-29 DIAGNOSIS — Z96653 Presence of artificial knee joint, bilateral: Secondary | ICD-10-CM | POA: Insufficient documentation

## 2017-08-29 DIAGNOSIS — I4891 Unspecified atrial fibrillation: Secondary | ICD-10-CM | POA: Diagnosis not present

## 2017-08-29 DIAGNOSIS — N189 Chronic kidney disease, unspecified: Secondary | ICD-10-CM | POA: Diagnosis not present

## 2017-08-29 DIAGNOSIS — M109 Gout, unspecified: Secondary | ICD-10-CM | POA: Insufficient documentation

## 2017-08-29 NOTE — Patient Instructions (Signed)
Continue weighing daily and call for an overnight weight gain of > 2 pounds or a weekly weight gain of >5 pounds. 

## 2017-09-07 ENCOUNTER — Other Ambulatory Visit: Payer: Self-pay

## 2017-09-07 ENCOUNTER — Ambulatory Visit: Payer: Medicare Other | Attending: Pain Medicine | Admitting: Pain Medicine

## 2017-09-07 ENCOUNTER — Encounter: Payer: Self-pay | Admitting: Pain Medicine

## 2017-09-07 VITALS — BP 122/68 | HR 71 | Temp 97.7°F | Resp 16 | Ht 68.0 in | Wt 156.0 lb

## 2017-09-07 DIAGNOSIS — G9619 Other disorders of meninges, not elsewhere classified: Secondary | ICD-10-CM | POA: Diagnosis not present

## 2017-09-07 DIAGNOSIS — R200 Anesthesia of skin: Secondary | ICD-10-CM | POA: Diagnosis not present

## 2017-09-07 DIAGNOSIS — M4312 Spondylolisthesis, cervical region: Secondary | ICD-10-CM | POA: Insufficient documentation

## 2017-09-07 DIAGNOSIS — M545 Low back pain: Secondary | ICD-10-CM | POA: Insufficient documentation

## 2017-09-07 DIAGNOSIS — G2581 Restless legs syndrome: Secondary | ICD-10-CM | POA: Diagnosis not present

## 2017-09-07 DIAGNOSIS — M25512 Pain in left shoulder: Secondary | ICD-10-CM | POA: Diagnosis present

## 2017-09-07 DIAGNOSIS — R202 Paresthesia of skin: Secondary | ICD-10-CM | POA: Diagnosis not present

## 2017-09-07 DIAGNOSIS — T402X5A Adverse effect of other opioids, initial encounter: Secondary | ICD-10-CM | POA: Diagnosis not present

## 2017-09-07 DIAGNOSIS — M4726 Other spondylosis with radiculopathy, lumbar region: Secondary | ICD-10-CM | POA: Insufficient documentation

## 2017-09-07 DIAGNOSIS — M9981 Other biomechanical lesions of cervical region: Secondary | ICD-10-CM

## 2017-09-07 DIAGNOSIS — Z79891 Long term (current) use of opiate analgesic: Secondary | ICD-10-CM | POA: Insufficient documentation

## 2017-09-07 DIAGNOSIS — M5412 Radiculopathy, cervical region: Secondary | ICD-10-CM

## 2017-09-07 DIAGNOSIS — M47812 Spondylosis without myelopathy or radiculopathy, cervical region: Secondary | ICD-10-CM | POA: Diagnosis not present

## 2017-09-07 DIAGNOSIS — B1920 Unspecified viral hepatitis C without hepatic coma: Secondary | ICD-10-CM | POA: Insufficient documentation

## 2017-09-07 DIAGNOSIS — M5010 Cervical disc disorder with radiculopathy, unspecified cervical region: Secondary | ICD-10-CM | POA: Insufficient documentation

## 2017-09-07 DIAGNOSIS — Z96611 Presence of right artificial shoulder joint: Secondary | ICD-10-CM | POA: Insufficient documentation

## 2017-09-07 DIAGNOSIS — E559 Vitamin D deficiency, unspecified: Secondary | ICD-10-CM | POA: Insufficient documentation

## 2017-09-07 DIAGNOSIS — M4802 Spinal stenosis, cervical region: Secondary | ICD-10-CM

## 2017-09-07 DIAGNOSIS — M25511 Pain in right shoulder: Secondary | ICD-10-CM | POA: Insufficient documentation

## 2017-09-07 DIAGNOSIS — J449 Chronic obstructive pulmonary disease, unspecified: Secondary | ICD-10-CM | POA: Diagnosis not present

## 2017-09-07 DIAGNOSIS — Z8614 Personal history of Methicillin resistant Staphylococcus aureus infection: Secondary | ICD-10-CM | POA: Insufficient documentation

## 2017-09-07 DIAGNOSIS — M5030 Other cervical disc degeneration, unspecified cervical region: Secondary | ICD-10-CM | POA: Diagnosis not present

## 2017-09-07 DIAGNOSIS — M810 Age-related osteoporosis without current pathological fracture: Secondary | ICD-10-CM | POA: Insufficient documentation

## 2017-09-07 DIAGNOSIS — G8929 Other chronic pain: Secondary | ICD-10-CM

## 2017-09-07 DIAGNOSIS — G894 Chronic pain syndrome: Secondary | ICD-10-CM | POA: Insufficient documentation

## 2017-09-07 DIAGNOSIS — Z79899 Other long term (current) drug therapy: Secondary | ICD-10-CM | POA: Insufficient documentation

## 2017-09-07 DIAGNOSIS — M48061 Spinal stenosis, lumbar region without neurogenic claudication: Secondary | ICD-10-CM | POA: Insufficient documentation

## 2017-09-07 DIAGNOSIS — M5137 Other intervertebral disc degeneration, lumbosacral region: Secondary | ICD-10-CM | POA: Insufficient documentation

## 2017-09-07 DIAGNOSIS — Z87891 Personal history of nicotine dependence: Secondary | ICD-10-CM | POA: Insufficient documentation

## 2017-09-07 DIAGNOSIS — E785 Hyperlipidemia, unspecified: Secondary | ICD-10-CM | POA: Insufficient documentation

## 2017-09-07 DIAGNOSIS — N183 Chronic kidney disease, stage 3 (moderate): Secondary | ICD-10-CM | POA: Insufficient documentation

## 2017-09-07 DIAGNOSIS — M533 Sacrococcygeal disorders, not elsewhere classified: Secondary | ICD-10-CM | POA: Diagnosis not present

## 2017-09-07 DIAGNOSIS — Z853 Personal history of malignant neoplasm of breast: Secondary | ICD-10-CM | POA: Insufficient documentation

## 2017-09-07 DIAGNOSIS — N3281 Overactive bladder: Secondary | ICD-10-CM | POA: Insufficient documentation

## 2017-09-07 DIAGNOSIS — M542 Cervicalgia: Secondary | ICD-10-CM

## 2017-09-07 DIAGNOSIS — K5903 Drug induced constipation: Secondary | ICD-10-CM | POA: Diagnosis not present

## 2017-09-07 DIAGNOSIS — I2510 Atherosclerotic heart disease of native coronary artery without angina pectoris: Secondary | ICD-10-CM | POA: Insufficient documentation

## 2017-09-07 DIAGNOSIS — I4891 Unspecified atrial fibrillation: Secondary | ICD-10-CM | POA: Insufficient documentation

## 2017-09-07 DIAGNOSIS — Z950 Presence of cardiac pacemaker: Secondary | ICD-10-CM | POA: Insufficient documentation

## 2017-09-07 DIAGNOSIS — M109 Gout, unspecified: Secondary | ICD-10-CM | POA: Insufficient documentation

## 2017-09-07 DIAGNOSIS — M4722 Other spondylosis with radiculopathy, cervical region: Secondary | ICD-10-CM | POA: Insufficient documentation

## 2017-09-07 DIAGNOSIS — I5032 Chronic diastolic (congestive) heart failure: Secondary | ICD-10-CM | POA: Insufficient documentation

## 2017-09-07 DIAGNOSIS — M4310 Spondylolisthesis, site unspecified: Secondary | ICD-10-CM | POA: Diagnosis not present

## 2017-09-07 DIAGNOSIS — M47892 Other spondylosis, cervical region: Secondary | ICD-10-CM | POA: Diagnosis not present

## 2017-09-07 DIAGNOSIS — K219 Gastro-esophageal reflux disease without esophagitis: Secondary | ICD-10-CM | POA: Insufficient documentation

## 2017-09-07 DIAGNOSIS — Z7901 Long term (current) use of anticoagulants: Secondary | ICD-10-CM | POA: Insufficient documentation

## 2017-09-07 DIAGNOSIS — N189 Chronic kidney disease, unspecified: Secondary | ICD-10-CM | POA: Insufficient documentation

## 2017-09-07 NOTE — Patient Instructions (Addendum)
____________________________________________________________________________________________  Pain Scale  Introduction: The pain score used by this practice is the Verbal Numerical Rating Scale (VNRS-11). This is an 11-point scale. It is for adults and children 10 years or older. There are significant differences in how the pain score is reported, used, and applied. Forget everything you learned in the past and learn this scoring system.  General Information: The scale should reflect your current level of pain. Unless you are specifically asked for the level of your worst pain, or your average pain. If you are asked for one of these two, then it should be understood that it is over the past 24 hours.  Basic Activities of Daily Living (ADL): Personal hygiene, dressing, eating, transferring, and using restroom.  Instructions: Most patients tend to report their level of pain as a combination of two factors, their physical pain and their psychosocial pain. This last one is also known as "suffering" and it is reflection of how physical pain affects you socially and psychologically. From now on, report them separately. From this point on, when asked to report your pain level, report only your physical pain. Use the following table for reference.  Pain Clinic Pain Levels (0-5/10)  Pain Level Score  Description  No Pain 0   Mild pain 1 Nagging, annoying, but does not interfere with basic activities of daily living (ADL). Patients are able to eat, bathe, get dressed, toileting (being able to get on and off the toilet and perform personal hygiene functions), transfer (move in and out of bed or a chair without assistance), and maintain continence (able to control bladder and bowel functions). Blood pressure and heart rate are unaffected. A normal heart rate for a healthy adult ranges from 60 to 100 bpm (beats per minute).   Mild to moderate pain 2 Noticeable and distracting. Impossible to hide from other  people. More frequent flare-ups. Still possible to adapt and function close to normal. It can be very annoying and may have occasional stronger flare-ups. With discipline, patients may get used to it and adapt.   Moderate pain 3 Interferes significantly with activities of daily living (ADL). It becomes difficult to feed, bathe, get dressed, get on and off the toilet or to perform personal hygiene functions. Difficult to get in and out of bed or a chair without assistance. Very distracting. With effort, it can be ignored when deeply involved in activities.   Moderately severe pain 4 Impossible to ignore for more than a few minutes. With effort, patients may still be able to manage work or participate in some social activities. Very difficult to concentrate. Signs of autonomic nervous system discharge are evident: dilated pupils (mydriasis); mild sweating (diaphoresis); sleep interference. Heart rate becomes elevated (>115 bpm). Diastolic blood pressure (lower number) rises above 100 mmHg. Patients find relief in laying down and not moving.   Severe pain 5 Intense and extremely unpleasant. Associated with frowning face and frequent crying. Pain overwhelms the senses.  Ability to do any activity or maintain social relationships becomes significantly limited. Conversation becomes difficult. Pacing back and forth is common, as getting into a comfortable position is nearly impossible. Pain wakes you up from deep sleep. Physical signs will be obvious: pupillary dilation; increased sweating; goosebumps; brisk reflexes; cold, clammy hands and feet; nausea, vomiting or dry heaves; loss of appetite; significant sleep disturbance with inability to fall asleep or to remain asleep. When persistent, significant weight loss is observed due to the complete loss of appetite and sleep deprivation.  Blood   pressure and heart rate becomes significantly elevated. Caution: If elevated blood pressure triggers a pounding headache  associated with blurred vision, then the patient should immediately seek attention at an urgent or emergency care unit, as these may be signs of an impending stroke.    Emergency Department Pain Levels (6-10/10)  Emergency Room Pain 6 Severely limiting. Requires emergency care and should not be seen or managed at an outpatient pain management facility. Communication becomes difficult and requires great effort. Assistance to reach the emergency department may be required. Facial flushing and profuse sweating along with potentially dangerous increases in heart rate and blood pressure will be evident.   Distressing pain 7 Self-care is very difficult. Assistance is required to transport, or use restroom. Assistance to reach the emergency department will be required. Tasks requiring coordination, such as bathing and getting dressed become very difficult.   Disabling pain 8 Self-care is no longer possible. At this level, pain is disabling. The individual is unable to do even the most "basic" activities such as walking, eating, bathing, dressing, transferring to a bed, or toileting. Fine motor skills are lost. It is difficult to think clearly.   Incapacitating pain 9 Pain becomes incapacitating. Thought processing is no longer possible. Difficult to remember your own name. Control of movement and coordination are lost.   The worst pain imaginable 10 At this level, most patients pass out from pain. When this level is reached, collapse of the autonomic nervous system occurs, leading to a sudden drop in blood pressure and heart rate. This in turn results in a temporary and dramatic drop in blood flow to the brain, leading to a loss of consciousness. Fainting is one of the body's self defense mechanisms. Passing out puts the brain in a calmed state and causes it to shut down for a while, in order to begin the healing process.    Summary: 1. Refer to this scale when providing [name] with your pain level. 2. Be  accurate and careful when reporting your pain level. This will help with your care. 3. Over-reporting your pain level will lead to loss of credibility. 4. Even a level of 1/10 means that there is pain and will be treated at our facility. 5. High, inaccurate reporting will be documented as "Symptom Exaggeration", leading to loss of credibility and suspicions of possible secondary gains such as obtaining more narcotics, or wanting to appear disabled, for fraudulent reasons. 6. Only pain levels of 5 or below will be seen at our facility. 7. Pain levels of 6 and above will be sent to the Emergency Department and the appointment cancelled. ____________________________________________________________________________________________   ____________________________________________________________________________________________  DRUG HOLIDAYS  What is a "Drug Holiday"? Drug Holiday: is the name given to the period of time during which a patient stops taking a medication(s) for the purpose of eliminating tolerance to the drug.  Benefits . Improved effectiveness of opioids. . Decreased opioid dose needed to achieve benefits. . Improved pain with lesser dose.  What is tolerance? Tolerance: is the progressive decreased in effectiveness of a drug due to its repetitive use. With repetitive use, the body gets use to the medication and as a consequence, it loses its effectiveness. This is a common problem seen with opioid pain medications. As a result, a larger dose of the drug is needed to achieve the same effect that used to be obtained with a smaller dose.  How long should a "Drug Holiday" last? At least 14 consecutive days. (2 weeks)  What are  withdrawals? Withdrawals: refers to the wide range of symptoms that occur after stopping or dramatically reducing opiate drugs after heavy and prolonged use. Withdrawal symptoms do not occur to patients that use low dose opioids, or those who take the medication  sporadically. Contrary to benzodiazepine (example: Valium, Xanax, etc.) or alcohol withdrawals ("Delirium Tremens"), opioid withdrawals are not lethal. Withdrawals are the physical manifestation of the body getting rid of the excess receptors.  Expected Symptoms Early symptoms of withdrawal may include: . Agitation . Anxiety . Muscle aches . Increased tearing . Insomnia . Runny nose . Sweating . Yawning  Late symptoms of withdrawal may include: . Abdominal cramping . Diarrhea . Dilated pupils . Goose bumps . Nausea . Vomiting  Will I experience withdrawals? Due to the slow nature of the taper, it is very unlikely that you will experience any.  What is a slow taper? Taper: refers to the gradual decrease in dose. ___________________________________________________________________________________________ ____________________________________________________________________________________________  Preparing for Procedure with Sedation Instructions: . Oral Intake: Do not eat or drink anything for at least 8 hours prior to your procedure. . Transportation: Public transportation is not allowed. Bring an adult driver. The driver must be physically present in our waiting room before any procedure can be started. Marland Kitchen Physical Assistance: Bring an adult physically capable of assisting you, in the event you need help. This adult should keep you company at home for at least 6 hours after the procedure. . Blood Pressure Medicine: Take your blood pressure medicine with a sip of water the morning of the procedure. . Blood thinners:  . Diabetics on insulin: Notify the staff so that you can be scheduled 1st case in the morning. If your diabetes requires high dose insulin, take only  of your normal insulin dose the morning of the procedure and notify the staff that you have done so. . Preventing infections: Shower with an antibacterial soap the morning of your procedure. . Build-up your immune  system: Take 1000 mg of Vitamin C with every meal (3 times a day) the day prior to your procedure. Marland Kitchen Antibiotics: Inform the staff if you have a condition or reason that requires you to take antibiotics before dental procedures. . Pregnancy: If you are pregnant, call and cancel the procedure. . Sickness: If you have a cold, fever, or any active infections, call and cancel the procedure. . Arrival: You must be in the facility at least 30 minutes prior to your scheduled procedure. . Children: Do not bring children with you. . Dress appropriately: Bring dark clothing that you would not mind if they get stained. . Valuables: Do not bring any jewelry or valuables. Procedure appointments are reserved for interventional treatments only. Marland Kitchen No Prescription Refills. . No medication changes will be discussed during procedure appointments. . No disability issues will be discussed. ____________________________________________________________________________________________  Cervical EpiduralGENERAL RISKS AND COMPLICATIONS  What are the risk, side effects and possible complications? Generally speaking, most procedures are safe.  However, with any procedure there are risks, side effects, and the possibility of complications.  The risks and complications are dependent upon the sites that are lesioned, or the type of nerve block to be performed.  The closer the procedure is to the spine, the more serious the risks are.  Great care is taken when placing the radio frequency needles, block needles or lesioning probes, but sometimes complications can occur. 1. Infection: Any time there is an injection through the skin, there is a risk of infection.  This is why sterile  conditions are used for these blocks.  There are four possible types of infection. 1. Localized skin infection. 2. Central Nervous System Infection-This can be in the form of Meningitis, which can be deadly. 3. Epidural Infections-This can be in the  form of an epidural abscess, which can cause pressure inside of the spine, causing compression of the spinal cord with subsequent paralysis. This would require an emergency surgery to decompress, and there are no guarantees that the patient would recover from the paralysis. 4. Discitis-This is an infection of the intervertebral discs.  It occurs in about 1% of discography procedures.  It is difficult to treat and it may lead to surgery.        2. Pain: the needles have to go through skin and soft tissues, will cause soreness.       3. Damage to internal structures:  The nerves to be lesioned may be near blood vessels or    other nerves which can be potentially damaged.       4. Bleeding: Bleeding is more common if the patient is taking blood thinners such as  aspirin, Coumadin, Ticiid, Plavix, etc., or if he/she have some genetic predisposition  such as hemophilia. Bleeding into the spinal canal can cause compression of the spinal  cord with subsequent paralysis.  This would require an emergency surgery to  decompress and there are no guarantees that the patient would recover from the  paralysis.       5. Pneumothorax:  Puncturing of a lung is a possibility, every time a needle is introduced in  the area of the chest or upper back.  Pneumothorax refers to free air around the  collapsed lung(s), inside of the thoracic cavity (chest cavity).  Another two possible  complications related to a similar event would include: Hemothorax and Chylothorax.   These are variations of the Pneumothorax, where instead of air around the collapsed  lung(s), you may have blood or chyle, respectively.       6. Spinal headaches: They may occur with any procedures in the area of the spine.       7. Persistent CSF (Cerebro-Spinal Fluid) leakage: This is a rare problem, but may occur  with prolonged intrathecal or epidural catheters either due to the formation of a fistulous  track or a dural tear.       8. Nerve damage: By  working so close to the spinal cord, there is always a possibility of  nerve damage, which could be as serious as a permanent spinal cord injury with  paralysis.       9. Death:  Although rare, severe deadly allergic reactions known as "Anaphylactic  reaction" can occur to any of the medications used.      10. Worsening of the symptoms:  We can always make thing worse.  What are the chances of something like this happening? Chances of any of this occuring are extremely low.  By statistics, you have more of a chance of getting killed in a motor vehicle accident: while driving to the hospital than any of the above occurring .  Nevertheless, you should be aware that they are possibilities.  In general, it is similar to taking a shower.  Everybody knows that you can slip, hit your head and get killed.  Does that mean that you should not shower again?  Nevertheless always keep in mind that statistics do not mean anything if you happen to be on the wrong side of them.  Even if a procedure has a 1 (one) in a 1,000,000 (million) chance of going wrong, it you happen to be that one..Also, keep in mind that by statistics, you have more of a chance of having something go wrong when taking medications.  Who should not have this procedure? If you are on a blood thinning medication (e.g. Coumadin, Plavix, see list of "Blood Thinners"), or if you have an active infection going on, you should not have the procedure.  If you are taking any blood thinners, please inform your physician.  How should I prepare for this procedure?  Do not eat or drink anything at least six hours prior to the procedure.  Bring a driver with you .  It cannot be a taxi.  Come accompanied by an adult that can drive you back, and that is strong enough to help you if your legs get weak or numb from the local anesthetic.  Take all of your medicines the morning of the procedure with just enough water to swallow them.  If you have diabetes,  make sure that you are scheduled to have your procedure done first thing in the morning, whenever possible.  If you have diabetes, take only half of your insulin dose and notify our nurse that you have done so as soon as you arrive at the clinic.  If you are diabetic, but only take blood sugar pills (oral hypoglycemic), then do not take them on the morning of your procedure.  You may take them after you have had the procedure.  Do not take aspirin or any aspirin-containing medications, at least eleven (11) days prior to the procedure.  They may prolong bleeding.  Wear loose fitting clothing that may be easy to take off and that you would not mind if it got stained with Betadine or blood.  Do not wear any jewelry or perfume  Remove any nail coloring.  It will interfere with some of our monitoring equipment.  NOTE: Remember that this is not meant to be interpreted as a complete list of all possible complications.  Unforeseen problems may occur.  BLOOD THINNERS The following drugs contain aspirin or other products, which can cause increased bleeding during surgery and should not be taken for 2 weeks prior to and 1 week after surgery.  If you should need take something for relief of minor pain, you may take acetaminophen which is found in Tylenol,m Datril, Anacin-3 and Panadol. It is not blood thinner. The products listed below are.  Do not take any of the products listed below in addition to any listed on your instruction sheet.  A.P.C or A.P.C with Codeine Codeine Phosphate Capsules #3 Ibuprofen Ridaura  ABC compound Congesprin Imuran rimadil  Advil Cope Indocin Robaxisal  Alka-Seltzer Effervescent Pain Reliever and Antacid Coricidin or Coricidin-D  Indomethacin Rufen  Alka-Seltzer plus Cold Medicine Cosprin Ketoprofen S-A-C Tablets  Anacin Analgesic Tablets or Capsules Coumadin Korlgesic Salflex  Anacin Extra Strength Analgesic tablets or capsules CP-2 Tablets Lanoril Salicylate  Anaprox  Cuprimine Capsules Levenox Salocol  Anexsia-D Dalteparin Magan Salsalate  Anodynos Darvon compound Magnesium Salicylate Sine-off  Ansaid Dasin Capsules Magsal Sodium Salicylate  Anturane Depen Capsules Marnal Soma  APF Arthritis pain formula Dewitt's Pills Measurin Stanback  Argesic Dia-Gesic Meclofenamic Sulfinpyrazone  Arthritis Bayer Timed Release Aspirin Diclofenac Meclomen Sulindac  Arthritis pain formula Anacin Dicumarol Medipren Supac  Analgesic (Safety coated) Arthralgen Diffunasal Mefanamic Suprofen  Arthritis Strength Bufferin Dihydrocodeine Mepro Compound Suprol  Arthropan liquid Dopirydamole Methcarbomol with Aspirin  Synalgos  ASA tablets/Enseals Disalcid Micrainin Tagament  Ascriptin Doan's Midol Talwin  Ascriptin A/D Dolene Mobidin Tanderil  Ascriptin Extra Strength Dolobid Moblgesic Ticlid  Ascriptin with Codeine Doloprin or Doloprin with Codeine Momentum Tolectin  Asperbuf Duoprin Mono-gesic Trendar  Aspergum Duradyne Motrin or Motrin IB Triminicin  Aspirin plain, buffered or enteric coated Durasal Myochrisine Trigesic  Aspirin Suppositories Easprin Nalfon Trillsate  Aspirin with Codeine Ecotrin Regular or Extra Strength Naprosyn Uracel  Atromid-S Efficin Naproxen Ursinus  Auranofin Capsules Elmiron Neocylate Vanquish  Axotal Emagrin Norgesic Verin  Azathioprine Empirin or Empirin with Codeine Normiflo Vitamin E  Azolid Emprazil Nuprin Voltaren  Bayer Aspirin plain, buffered or children's or timed BC Tablets or powders Encaprin Orgaran Warfarin Sodium  Buff-a-Comp Enoxaparin Orudis Zorpin  Buff-a-Comp with Codeine Equegesic Os-Cal-Gesic   Buffaprin Excedrin plain, buffered or Extra Strength Oxalid   Bufferin Arthritis Strength Feldene Oxphenbutazone   Bufferin plain or Extra Strength Feldene Capsules Oxycodone with Aspirin   Bufferin with Codeine Fenoprofen Fenoprofen Pabalate or Pabalate-SF   Buffets II Flogesic Panagesic   Buffinol plain or Extra Strength Florinal  or Florinal with Codeine Panwarfarin   Buf-Tabs Flurbiprofen Penicillamine   Butalbital Compound Four-way cold tablets Penicillin   Butazolidin Fragmin Pepto-Bismol   Carbenicillin Geminisyn Percodan   Carna Arthritis Reliever Geopen Persantine   Carprofen Gold's salt Persistin   Chloramphenicol Goody's Phenylbutazone   Chloromycetin Haltrain Piroxlcam   Clmetidine heparin Plaquenil   Cllnoril Hyco-pap Ponstel   Clofibrate Hydroxy chloroquine Propoxyphen         Before stopping any of these medications, be sure to consult the physician who ordered them.  Some, such as Coumadin (Warfarin) are ordered to prevent or treat serious conditions such as "deep thrombosis", "pumonary embolisms", and other heart problems.  The amount of time that you may need off of the medication may also vary with the medication and the reason for which you were taking it.  If you are taking any of these medications, please make sure you notify your pain physician before you undergo any procedures.          Epidural Steroid Injection An epidural steroid injection is a shot of steroid medicine and numbing medicine that is given into the space between the spinal cord and the bones in your back (epidural space). The shot helps relieve pain caused by an irritated or swollen nerve root. The amount of pain relief you get from the injection depends on what is causing the nerve to be swollen and irritated, and how long your pain lasts. You are more likely to benefit from this injection if your pain is strong and comes on suddenly rather than if you have had pain for a long time. Tell a health care provider about:  Any allergies you have.  All medicines you are taking, including vitamins, herbs, eye drops, creams, and over-the-counter medicines.  Any problems you or family members have had with anesthetic medicines.  Any blood disorders you have.  Any surgeries you have had.  Any medical conditions you  have.  Whether you are pregnant or may be pregnant. What are the risks? Generally, this is a safe procedure. However, problems may occur, including:  Headache.  Bleeding.  Infection.  Allergic reaction to medicines.  Damage to your nerves.  What happens before the procedure? Staying hydrated Follow instructions from your health care provider about hydration, which may include:  Up to 2 hours before the procedure - you may continue to drink clear liquids, such  as water, clear fruit juice, black coffee, and plain tea.  Eating and drinking restrictions Follow instructions from your health care provider about eating and drinking, which may include:  8 hours before the procedure - stop eating heavy meals or foods such as meat, fried foods, or fatty foods.  6 hours before the procedure - stop eating light meals or foods, such as toast or cereal.  6 hours before the procedure - stop drinking milk or drinks that contain milk.  2 hours before the procedure - stop drinking clear liquids.  Medicine  You may be given medicines to lower anxiety.  Ask your health care provider about: ? Changing or stopping your regular medicines. This is especially important if you are taking diabetes medicines or blood thinners. ? Taking medicines such as aspirin and ibuprofen. These medicines can thin your blood. Do not take these medicines before your procedure if your health care provider instructs you not to. General instructions  Plan to have someone take you home from the hospital or clinic. What happens during the procedure?  You may receive a medicine to help you relax (sedative).  You will be asked to lie on your abdomen.  The injection site will be cleaned.  A numbing medicine (local anesthetic) will be used to numb the injection site.  A needle will be inserted through your skin into the epidural space. You may feel some discomfort when this happens. An X-ray machine will be used to  make sure the needle is put as close as possible to the affected nerve.  A steroid medicine and a local anesthetic will be injected into the epidural space.  The needle will be removed.  A bandage (dressing) will be put over the injection site. What happens after the procedure?  Your blood pressure, heart rate, breathing rate, and blood oxygen level will be monitored until the medicines you were given have worn off.  Your arm or leg may feel weak or numb for a few hours.  The injection site may feel sore.  Do not drive for 24 hours if you received a sedative. This information is not intended to replace advice given to you by your health care provider. Make sure you discuss any questions you have with your health care provider. Document Released: 11/16/2007 Document Revised: 01/21/2016 Document Reviewed: 11/25/2015 [employer] Interactive Patient Education  [employer].

## 2017-09-07 NOTE — Progress Notes (Signed)
Patient's Name: Kristin [name]  MRN: [id number]  Referring Provider: Mikey College, *  DOB: [birth year]  PCP: Mikey College, NP  DOS: 09/07/2017  Note by: Gaspar Cola, MD  Service setting: Ambulatory outpatient  Specialty: Interventional Pain Management  Location: [facility] (AMB) Pain Management Facility    Patient type: Established   Primary Reason(s) for Visit: Evaluation of chronic illnesses with exacerbation, or progression (Level of risk: moderate) CC: Shoulder Pain (bilateral)  HPI  [name] is a 84 y.o. year old, female patient, who comes today for a follow-up evaluation. She has Chronic kidney disease (CKD), stage III (moderate) (HCC); DDD (degenerative disc disease), lumbosacral; Gout; Benign essential HTN; Lumbar central spinal stenosis; Chronic obstructive pulmonary disease ([place]); Chemical diabetes; History of cardiac catheterization; Seasonal allergies; History of breast cancer; Chronic pain syndrome; Chronic low back pain (Primary Source of Pain) (Bilateral) (L>R); Lumbar facet syndrome (Bilateral) (L>R); Lumbar spondylosis; Chronic lumbar radicular pain (Bilateral) (L>R) (L5 Dermatome); Restless leg syndrome; Myofascial pain; Neurogenic pain; Long term current use of opiate analgesic; Long term prescription opiate use; Opiate use (90 MME/Day); Opioid-induced constipation (OIC); Vitamin D insufficiency; Grade 1 Retrolisthesis of L1 over L2; Lumbar facet hypertrophy (L1-2, L2-3, and L4-5); Lumbar lateral recess stenosis (L1-2 and L4-5); 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); Epidural fibrosis; Lumbar foraminal stenosis (Severe Left L4-5); 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); Osteoporosis; Chronic sacroiliac joint pain (Left); History of methicillin resistant staphylococcus aureus (MRSA); Mitral valve prolapse; GERD (gastroesophageal reflux disease); Chronic shoulder pain (S/P replacement) (Right); Osteoarthritis, multiple sites; Shoulder  pain S/P prostheses (Right); Chronic diastolic CHF (congestive heart failure) ([place]); S/P cardiac pacemaker procedure; Anticoagulated; Hyperlipidemia, unspecified; Chronic neck pain; Chronic hip pain (Bilateral); Chronic cervical radicular pain (Bilateral) (L>R); Long term current use of anticoagulant; Chronic lower extremity pain (Secondary Area of Pain) (Bilateral) (L>R); DDD (degenerative disc disease), cervical; AKI (acute kidney injury) ([place]); Numbness and tingling of upper extremity (Bilateral); Radicular pain of shoulder (Bilateral) (L>R); Cervical facet syndrome (Bilateral) (L>R); Cervical (3-4 mm) Anterolisthesis of C4 on C5; and Cervical foraminal stenosis (Bilateral) on their problem list. [name] was last seen on 07/29/2017. Her primarily concern today is the Shoulder Pain (bilateral)  Pain Assessment: Location: Left, Right Shoulder Radiating: does not radiate Onset: More than a month ago Duration: Chronic pain Quality: Aching Severity: 6 /10 (self-reported pain score)  Note: Reported level is inconsistent with clinical observations. Clinically the patient looks like a 2/10 A 2/10 is viewed as "Mild to Moderate" and described as noticeable and distracting. Impossible to hide from other people. More frequent flare-ups. Still possible to adapt and function close to normal. It can be very annoying and may have occasional stronger flare-ups. With discipline, patients may get used to it and adapt. Information on the proper use of the pain scale provided to the patient today. When using our objective Pain Scale, levels between 6 and 10/10 are said to belong in an emergency room, as it progressively worsens from a 6/10, described as severely limiting, requiring emergency care not usually available at an outpatient pain management facility. At a 6/10 level, communication becomes difficult and requires great effort. Assistance to reach the emergency department may be required. Facial flushing and  profuse sweating along with potentially dangerous increases in heart rate and blood pressure will be evident. Effect on ADL: cant raise right arm, lifting,  Timing: Constant Modifying factors: medications, heat  [name] comes into the clinic today to discuss her chronic pain situation.  I have received several communications from her other healthcare providers indicating that she continues to complain to them about her pain.  The problem here is that the patient has developed significant tolerance to opioids over time and even though we have talked to her in more than 5 different occasions about the need to do a "Drug Holiday", she continues to defer this.  The fact of the matter is that I have been treating this patient for very long time and for this reason I continue to allow her to stay on her current pain medication dose.  However, she continues to request increases in her dose, as would be expected on anybody who has significant tolerance and chooses not to manage it.  PICC line no longer take patient's like Ms. Medlen, in the sense that if they do not agree to let me treat their tolerance to opioids when it occurs, I simply do not take to her case.  The reason being that her current status is very predictable, as she is choosing not to engage on the very thing that would improve her chronic pain.  In addition to this, she continues to report very high levels of pain when in fact, they are not compatible with our observations.  Today, once again, we have talked about the need to proceed with a "Drug Holiday" in order to make her medications worked better.  Today she comes complaining about her shoulder pain however, further questioning reveals that she is experiencing bilateral arm and numbness, upper extremity pain, neck pain, and the shoulder pain is primarily over the area of the trapezius.  She does have a recent x-ray of the cervical spine demonstrating an anterolisthesis, associated with  bilateral multilevel foraminal stenosis and facet syndrome.  She is also experiencing cervicogenic headaches and she is likely to be experiencing bilateral cervical radiculopathy.  Because of this I will be scheduling the patient to return for a left-sided cervical epidural steroid injection under fluoroscopic guidance and IV sedation.  Further details on both, my assessment(s), as well as the proposed treatment plan, please see below.  Laboratory Chemistry  Inflammation Markers (CRP: Acute Phase) (ESR: Chronic Phase) Lab Results  Component Value Date   CRP <0.5 09/24/2015   ESRSEDRATE 12 09/24/2015                 Renal Function Markers Lab Results  Component Value Date   BUN 19 08/22/2017   CREATININE 1.09 (H) 08/22/2017   GFRAA 52 (L) 08/22/2017   GFRNONAA 45 (L) 08/22/2017                 Hepatic Function Markers Lab Results  Component Value Date   AST 23 08/22/2017   ALT 11 (L) 08/22/2017   ALBUMIN 3.6 08/22/2017   ALKPHOS 60 08/22/2017                 Electrolytes Lab Results  Component Value Date   NA 139 08/22/2017   K 4.9 08/22/2017   CL 107 08/22/2017   CALCIUM 9.7 08/22/2017   MG 2.2 09/24/2015                 Coagulation Parameters Lab Results  Component Value Date   INR 1.24 12/16/2015   LABPROT 15.8 (H) 12/16/2015   APTT 26 12/16/2015   PLT 139 (L) 08/15/2017                 Cardiovascular Markers Lab Results  Component Value Date  TROPONINI <0.03 08/14/2017   HGB 11.6 (L) 08/15/2017   HCT 34.8 (L) 08/15/2017                 CA Markers Lab Results  Component Value Date   LABCA2 49.4 (H) 07/10/2013                 Note: Lab results reviewed.  Recent Diagnostic Imaging Review  Cervical Imaging: Cervical DG complete:  Results for orders placed during the hospital encounter of 03/01/17  DG Cervical Spine Complete   Narrative CLINICAL DATA:  Pt reports neck pain radiating to left shoulder x 1 week. Denies injury. Denies hx of the  same  EXAM: CERVICAL SPINE - COMPLETE 4+ VIEW  COMPARISON:  None.  FINDINGS: Disc desiccations at the C5-6 and C6-7 levels, moderate in degree, with associated disc space narrowings and mild osseous spurring. Associated osseous neural foramen narrowings noted at multiple levels.  3-4 mm anterolisthesis of C4 on C5, likely related to the underlying degenerative change, appearing slightly more pronounced on the swimmer's view. Also mild scoliosis.  No acute or suspicious osseous finding. No fracture line or displaced fracture fragment seen.  Bilateral carotid atherosclerosis. Visualized paravertebral soft tissues are otherwise unremarkable.  IMPRESSION: 1. Degenerative changes of the mid and lower cervical spine, mild to moderate in degree, with associated osseous neural foramen narrowings with possible associated nerve root impingement. If symptomatic could be radiculopathic in nature, would consider nonemergent cervical spine MRI for further characterization. 2. 3-4 mm anterolisthesis of C4 on C5, likely related to the underlying degenerative change. This anterolisthesis appears slightly more pronounced on the swimmer's view raising the possibility of an associated instability at this level. Consider further plain film evaluation with flexion and extension views. 3. No acute appearing findings.   Electronically Signed   By: Franki Cabot M.D.   On: 03/01/2017 15:38    Shoulder Imaging: Shoulder-L CT wo contrast:  Results for orders placed during the hospital encounter of 01/04/11  CT Shoulder Left Wo Contrast   Narrative *RADIOLOGY REPORT*  Clinical Data: Left shoulder pain for several months.  Weakness.  CT OF THE LEFT SHOULDER WITHOUT CONTRAST  Technique:  Multidetector CT imaging was performed according to the standard protocol. Multiplanar CT image reconstructions were also generated.  Comparison: None.  Findings: The scan demonstrates severe  osteoarthritis of the glenohumeral joint with subchondral cysts and extensive erosions of the humeral head and glenoid.  There is marked narrowing of the space between the acromion and humeral head consistent with a chronic full-thickness rotator cuff tear.   There is severe atrophy of the infraspinatus and supraspinatus muscles.  There is a type 3 acromion with erosions of the distal clavicle.  Note is made of emphysematous disease in the left lung.  There is also a 6.4 mm nodule in the left upper lobe along the major fissure.  This could be an intrapulmonary lymph node but I cannot exclude a lung neoplasm.  I recommend a follow-up chest CT scan in 3-6 months without contrast for further evaluation of this nodule.  Note is made of extensive coronary artery calcification.  IMPRESSION:  1.  Severe osteoarthritis of the left glenohumeral joint with a chronic rotator cuff tear and severe atrophy of the infraspinatus and supraspinatus muscles. 2.  6 mm nodule in the left lung along the major fissure, possibly an intrapulmonary lymph node.  However, I cannot exclude a lung neoplasm.  Follow-up CT scan without contrast in 3-6 months  is recommended. 3.  Emphysematous disease. 4.  Extensive coronary artery calcifications.  Original Report Authenticated By: Larey Seat, M.D.   Shoulder-L DG:  Results for orders placed during the hospital encounter of 08/22/16  DG Shoulder Left   Narrative CLINICAL DATA:  Shoulder pain after taking down Christmas lights.  EXAM: LEFT SHOULDER - 2+ VIEW  COMPARISON:  LEFT shoulder radiograph February 19, 2011  FINDINGS: Bony fragments project in the inferior glenohumeral joint space, no donor site. No dislocation. LEFT shoulder arthroplasty. Intact well-seated non cemented hardware without periprosthetic lucency. Cardiac pacemaker LEFT chest. Soft tissue planes are nonsuspicious.  IMPRESSION: Bony fragments LEFT inferior glenohumeral joint  space concerning for acute fracture, possible heterotopic ossification.   Electronically Signed   By: Elon Alas M.D.   On: 08/22/2016 22:21    Sacroiliac Joint Imaging: Sacroiliac Joint DG:  Results for orders placed during the hospital encounter of 04/20/17  DG Si Joints   Narrative CLINICAL DATA:  Hip pain, no known injury, initial encounter  EXAM: BILATERAL SACROILIAC JOINTS - 3+ VIEW  COMPARISON:  None.  FINDINGS: Degenerative changes are noted at the lumbosacral junction. The sacral ala are within normal limits. No significant sclerosis of the sacroiliac joints is noted. Degenerative changes of the hip joints right greater than left is noted.  IMPRESSION: No acute abnormality seen.   Electronically Signed   By: [name] M.D.   On: 04/20/2017 15:43    Hip Imaging: Hip-R DG 2-3 views:  Results for orders placed during the hospital encounter of 04/20/17  DG HIP UNILAT W OR W/O PELVIS 2-3 VIEWS RIGHT   Narrative CLINICAL DATA:  Bilateral hip pain  EXAM: DG HIP (WITH OR WITHOUT PELVIS) 2-3V RIGHT  COMPARISON:  None.  FINDINGS: Pelvic ring is intact. Degenerative changes of the right hip joint are noted somewhat more marked than that seen on the left side. No acute fracture or dislocation is noted. No soft tissue abnormality is seen.  IMPRESSION: Degenerative change of the right hip.   Electronically Signed   By: [name] M.D.   On: 04/20/2017 15:43    Hip-L DG 2-3 views:  Results for orders placed during the hospital encounter of 04/20/17  DG HIP UNILAT W OR W/O PELVIS 2-3 VIEWS LEFT   Narrative CLINICAL DATA:  Hip pain, initial encounter  EXAM: DG HIP (WITH OR WITHOUT PELVIS) 3V LEFT  COMPARISON:  None.  FINDINGS: Degenerative changes of left hip joint are seen. There somewhat less marked than that seen on the right. No soft tissue abnormality is seen.  IMPRESSION: Degenerative change without acute  abnormality.   Electronically Signed   By: [name] M.D.   On: 04/20/2017 15:44    Complexity Note: Imaging results reviewed. Results shared with Ms. Favaro, using Layman's terms.                         Meds   Current Outpatient Medications:  .  albuterol (PROVENTIL) (2.5 MG/3ML) 0.083% nebulizer solution, Take 3 mLs (2.5 mg total) by nebulization every 8 (eight) hours as needed. Shortness of breath and wheezing, Disp: 75 mL, Rfl: 12 .  apixaban (ELIQUIS) 5 MG TABS tablet, Take 1 tablet (5 mg total) by mouth 2 (two) times daily., Disp: 60 tablet, Rfl: 0 .  Cholecalciferol (VITAMIN D) 2000 units tablet, Take 2,000 Units by mouth daily. , Disp: , Rfl:  .  furosemide (LASIX) 20 MG tablet, Take 0.5 tablets (  10 mg total) by mouth daily., Disp: 15 tablet, Rfl: 1 .  levocetirizine (XYZAL) 5 MG tablet, Take 1 tablet (5 mg total) by mouth every evening., Disp: 30 tablet, Rfl: 12 .  lisinopril (PRINIVIL,ZESTRIL) 10 MG tablet, Take 5 mg by mouth daily., Disp: , Rfl:  .  Melatonin 10 MG CAPS, Take 20 mg by mouth at bedtime as needed., Disp: 60 capsule, Rfl: 2 .  [START ON 10/09/2017] Oxycodone HCl 20 MG TABS, Take 1 tablet (20 mg total) by mouth every 8 (eight) hours as needed., Disp: 90 tablet, Rfl: 0 .  [START ON 09/09/2017] Oxycodone HCl 20 MG TABS, Take 1 tablet (20 mg total) by mouth every 8 (eight) hours as needed., Disp: 90 tablet, Rfl: 0 .  Oxycodone HCl 20 MG TABS, Take 1 tablet (20 mg total) by mouth every 8 (eight) hours as needed., Disp: 90 tablet, Rfl: 0 .  OXYGEN, Inhale 2 L into the lungs at bedtime. , Disp: , Rfl:  .  bisacodyl (DULCOLAX) 5 MG EC tablet, Take 2 tablets (10 mg total) by mouth daily as needed for moderate constipation., Disp: 60 tablet, Rfl: 2 .  polyethylene glycol (MIRALAX / GLYCOLAX) packet, Take 17 g by mouth daily as needed for moderate constipation., Disp: 14 each, Rfl: 2 .  rOPINIRole (REQUIP) 2 MG tablet, Take 1 tablet (2 mg total) by mouth at bedtime., Disp:  30 tablet, Rfl: 2  ROS  Constitutional: Denies any fever or chills Gastrointestinal: No reported hemesis, hematochezia, vomiting, or acute GI distress Musculoskeletal: Denies any acute onset joint swelling, redness, loss of ROM, or weakness Neurological: No reported episodes of acute onset apraxia, aphasia, dysarthria, agnosia, amnesia, paralysis, loss of coordination, or loss of consciousness  Allergies  Ms. Azizi is allergic to flexeril [cyclobenzaprine hcl] and vancomycin.  PFSH  Drug: Ms. Hai  reports that she does not use drugs. Alcohol:  reports that she does not drink alcohol. Tobacco:  reports that she quit smoking about 18 years ago. Her smoking use included cigarettes. She has a 50.00 pack-year smoking history. she has never used smokeless tobacco. Medical:  has a past medical history of Arrhythmia, sinus node (04/03/2014), Arthritis, Arthritis, Atrial fibrillation ([place]), Back pain, Bradycardia, Breast cancer ([place]) (2004), Cancer ([place]) (2004), CHF (congestive heart failure) ([place]), Chronic back pain, CKD (chronic kidney disease), COPD (chronic obstructive pulmonary disease) ([place]), Cystocele, Decubitus ulcers, Dehydration, Gout, Hematuria, Hepatitis C, Hyperlipidemia, Hypertension, Hypomagnesemia (06/25/2015), OAB (overactive bladder), Overactive bladder, Restless leg, Shoulder pain, bilateral, Urinary frequency, UTI (lower urinary tract infection), and Vaginal atrophy. Surgical: Ms. Fergeson  has a past surgical history that includes Abdominal hysterectomy; Back surgery; Rotator cuff repair; Breast surgery; Cataract extraction w/PHACO (10/19/2011); Cataract extraction w/PHACO (11/02/2011); Carpal tunnel release; Joint replacement; Colon surgery; Pacemaker insertion (N/A, 12/24/2015); Cholecystectomy; Breast lumpectomy (Right, 2004); and Insert / replace / remove pacemaker. Family: family history includes Breast cancer in her paternal aunt; Breast cancer (age of onset: 31) in her sister; Cancer  in her sister; Heart disease in her father and mother; Kidney disease in her brother; Stroke in her mother.  Constitutional Exam  General appearance: Well nourished, well developed, and well hydrated. In no apparent acute distress Vitals:   09/07/17 1121  BP: 122/68  Pulse: 71  Resp: 16  Temp: 97.7 F (36.5 C)  SpO2: 98%  Weight: 156 lb (70.8 kg)  Height: _0  (1.727 m)   BMI Assessment: Estimated body mass index is 23.72 kg/m as calculated from the following:  Height as of this encounter: _0  (1.727 m).   Weight as of this encounter: 156 lb (70.8 kg).  BMI interpretation table: BMI level Category Range association with higher incidence of chronic pain  <18 kg/m2 Underweight   18.5-24.9 kg/m2 Ideal body weight   25-29.9 kg/m2 Overweight Increased incidence by 20%  30-34.9 kg/m2 Obese (Class I) Increased incidence by 68%  35-39.9 kg/m2 Severe obesity (Class II) Increased incidence by 136%  >40 kg/m2 Extreme obesity (Class III) Increased incidence by 254%   BMI Readings from Last 4 Encounters:  09/07/17 23.72 kg/m  08/29/17 24.50 kg/m  08/19/17 23.93 kg/m  08/14/17 23.11 kg/m   Wt Readings from Last 4 Encounters:  09/07/17 156 lb (70.8 kg)  08/29/17 161 lb 2 oz (73.1 kg)  08/19/17 157 lb 6.4 oz (71.4 kg)  08/14/17 152 lb (68.9 kg)  Psych/Mental status: Alert, oriented x 3 (person, place, & time)       Eyes: PERLA Respiratory: No evidence of acute respiratory distress  Cervical Spine Area Exam  Skin & Axial Inspection: No masses, redness, edema, swelling, or associated skin lesions Alignment: Symmetrical Functional ROM: Decreased ROM      Stability: No instability detected Muscle Tone/Strength: Functionally intact. No obvious neuro-muscular anomalies detected. Sensory (Neurological): Movement-associated pain Palpation: No palpable anomalies              Upper Extremity (UE) Exam    Side: Right upper extremity  Side: Left upper extremity  Skin & Extremity  Inspection: Skin color, temperature, and hair growth are WNL. No peripheral edema or cyanosis. No masses, redness, swelling, asymmetry, or associated skin lesions. No contractures.  Skin & Extremity Inspection: Skin color, temperature, and hair growth are WNL. No peripheral edema or cyanosis. No masses, redness, swelling, asymmetry, or associated skin lesions. No contractures.  Functional ROM: Decreased ROM for shoulder  Functional ROM: Decreased ROM for shoulder  Muscle Tone/Strength: Functionally intact. No obvious neuro-muscular anomalies detected.  Muscle Tone/Strength: Functionally intact. No obvious neuro-muscular anomalies detected.  Sensory (Neurological): Unimpaired          Sensory (Neurological): Unimpaired          Palpation: No palpable anomalies              Palpation: No palpable anomalies              Specialized Test(s): Deferred         Specialized Test(s): Deferred          Thoracic Spine Area Exam  Skin & Axial Inspection: No masses, redness, or swelling Alignment: Symmetrical Functional ROM: Unrestricted ROM Stability: No instability detected Muscle Tone/Strength: Functionally intact. No obvious neuro-muscular anomalies detected. Sensory (Neurological): Unimpaired Muscle strength & Tone: No palpable anomalies  Lumbar Spine Area Exam  Skin & Axial Inspection: No masses, redness, or swelling Alignment: Symmetrical Functional ROM: Decreased ROM      Stability: No instability detected Muscle Tone/Strength: Functionally intact. No obvious neuro-muscular anomalies detected. Sensory (Neurological): Movement-associated discomfort Palpation: Complains of area being tender to palpation       Provocative Tests: Lumbar Hyperextension and rotation test: evaluation deferred today       Lumbar Lateral bending test: evaluation deferred today       Patrick's Maneuver: evaluation deferred today                    Gait & Posture Assessment  Ambulation: Patient ambulates using a  cane Gait: Age-related, senile gait pattern Posture: Antalgic  Lower Extremity Exam    Side: Right lower extremity  Side: Left lower extremity  Skin & Extremity Inspection: Skin color, temperature, and hair growth are WNL. No peripheral edema or cyanosis. No masses, redness, swelling, asymmetry, or associated skin lesions. No contractures.  Skin & Extremity Inspection: Skin color, temperature, and hair growth are WNL. No peripheral edema or cyanosis. No masses, redness, swelling, asymmetry, or associated skin lesions. No contractures.  Functional ROM: Unrestricted ROM          Functional ROM: Unrestricted ROM          Muscle Tone/Strength: Functionally intact. No obvious neuro-muscular anomalies detected.  Muscle Tone/Strength: Functionally intact. No obvious neuro-muscular anomalies detected.  Sensory (Neurological): Unimpaired  Sensory (Neurological): Unimpaired  Palpation: No palpable anomalies  Palpation: No palpable anomalies   Assessment  Primary Diagnosis & Pertinent Problem List: The primary encounter diagnosis was Radicular pain of shoulder (Bilateral) (L>R). Diagnoses of Chronic cervical radicular pain (Bilateral) (L>R), Numbness and tingling of upper extremity (Bilateral), Cervical foraminal stenosis (Bilateral), DDD (degenerative disc disease), cervical, Chronic neck pain, Cervical facet syndrome (Bilateral) (L>R), and Cervical (3-4 mm) Anterolisthesis of C4 on C5 were also pertinent to this visit.  Status Diagnosis  Worsening Worsened Stable 1. Radicular pain of shoulder (Bilateral) (L>R)   2. Chronic cervical radicular pain (Bilateral) (L>R)   3. Numbness and tingling of upper extremity (Bilateral)   4. Cervical foraminal stenosis (Bilateral)   5. DDD (degenerative disc disease), cervical   6. Chronic neck pain   7. Cervical facet syndrome (Bilateral) (L>R)   8. Cervical (3-4 mm) Anterolisthesis of C4 on C5     Problems updated and reviewed during this visit: Problem   Numbness and tingling of upper extremity (Bilateral)  Radicular pain of shoulder (Bilateral) (L>R)  Cervical facet syndrome (Bilateral) (L>R)  Cervical (3-4 mm) Anterolisthesis of C4 on C5  Cervical foraminal stenosis (Bilateral)  Chronic cervical radicular pain (Bilateral) (L>R)   Plan of Care  Pharmacotherapy (Medications Ordered): No orders of the defined types were placed in this encounter.  Medications administered today: [name] had no medications administered during this visit.   Procedure Orders     Cervical Epidural Injection Lab Orders  No laboratory test(s) ordered today   Imaging Orders  No imaging studies ordered today   Referral Orders  No referral(s) requested today    Interventional management options: Planned, scheduled, and/or pending:   Diagnostic left-sided cervical epidural steroid injection under fluoroscopic guidance and IV sedation   Considering:   Diagnostic bilateral sacroiliac joint block Possible bilateral sacroiliac joint RFA Diagnostic bilateral intra-articular hip joint injection Diagnostic bilateral femoral nerve + obturator nerve block Possible bilateral femoral nerve + obturator nerve RFA Possible bilateral lumbar facet radiofrequency. (Left side done on 12/13/2016) Palliative bilateral lumbar facet block  Diagnostic right intra-articular shoulder joint injection Diagnostic right suprascapular nerve block Possible right suprascapular radiofrequencyablation.    Palliative PRN treatment(s):   Palliative bilateral lumbar facet block Diagnostic right intra-articular shoulder joint injection  Diagnostic right suprascapular nerve block Possible right suprascapular radiofrequencyablation.   Provider-requested follow-up: Return for Procedure (w/ sedation): (L) CESI #1.  Future Appointments  Date Time Provider [place]  09/15/2017  9:45 AM Milinda Pointer, MD [facility] None  09/16/2017 11:00 AM Mikey College, NP [facility] None  11/01/2017 11:15 AM Vevelyn Francois, NP [facility] None  01/18/2018 10:20 AM Mikey College, NP [facility] None   Primary Care Physician: Mikey College, NP Location: [facility] Management Facility Note by: Gaspar Cola, MD Date: 09/07/2017; Time: 1:15 PM

## 2017-09-07 NOTE — Progress Notes (Signed)
Safety precautions to be maintained throughout the outpatient stay will include: orient to surroundings, keep bed in low position, maintain call bell within reach at all times, provide assistance with transfer out of bed and ambulation.  

## 2017-09-13 ENCOUNTER — Other Ambulatory Visit: Payer: Self-pay

## 2017-09-13 DIAGNOSIS — I5032 Chronic diastolic (congestive) heart failure: Secondary | ICD-10-CM

## 2017-09-13 MED ORDER — FUROSEMIDE 20 MG PO TABS: 10.0000 mg | ORAL_TABLET | Freq: Every day | ORAL | 1 refills | Status: DC

## 2017-09-14 NOTE — Progress Notes (Signed)
Patient's Name: Kristin [name]  MRN: [id number]  Referring Provider: Mikey College, *  DOB: 03-Jul-1930  PCP: Mikey College, NP  DOS: 09/15/2017  Note by: Gaspar Cola, MD  Service setting: Ambulatory outpatient  Specialty: Interventional Pain Management  Patient type: Established  Location: [facility] (AMB) Pain Management Facility  Visit type: Interventional Procedure   Primary Reason for Visit: Interventional Pain Management Treatment. CC: Shoulder Pain (bilateral) and Back Pain (lower)  Procedure:  Anesthesia, Analgesia, Anxiolysis:  Type: Diagnostic, Inter-Laminar, Epidural Steroid Injection #1  Region: Posterior Cervico-thoracic Region Level: C7-T1 Laterality: Left-Sided Paramedial  Type: Local Anesthesia with Moderate (Conscious) Sedation Local Anesthetic: Lidocaine 1% Route: Intravenous (IV) IV Access: Secured Sedation: Meaningful verbal contact was maintained at all times during the procedure  Indication(s): Analgesia and Anxiety   Indications: 1. Chronic cervical radicular pain (Bilateral) (L>R)   2. Cervical foraminal stenosis (Bilateral)   3. Cervical (3-4 mm) Anterolisthesis of C4 on C5   4. Cervical facet syndrome (Bilateral) (L>R)   5. Chronic shoulder pain (S/P replacement) (Right)   6. Chronic radicular cervical pain   7. Foraminal stenosis of cervical region   8. Anterolisthesis   9. Cervical facet syndrome    Pain Score: Pre-procedure: 6 /10 Post-procedure: 0-No pain/10  Pre-op Assessment:  Kristin [name] is a 84 y.o. (year old), female patient, seen today for interventional treatment. She  has a past surgical history that includes Abdominal hysterectomy; Back surgery; Rotator cuff repair; Breast surgery; Cataract extraction w/PHACO (10/19/2011); Cataract extraction w/PHACO (11/02/2011); Carpal tunnel release; Joint replacement; Colon surgery; Pacemaker insertion (N/A, 12/24/2015); Cholecystectomy; Breast lumpectomy (Right, 2004); and Insert / replace /  remove pacemaker. Kristin [name] has a current medication list which includes the following prescription(s): albuterol, apixaban, vitamin d, furosemide, levocetirizine, lisinopril, melatonin, oxycodone hcl, oxycodone hcl, oxygen-helium, bisacodyl, oxycodone hcl, polyethylene glycol, and ropinirole, and the following Facility-Administered Medications: fentanyl and midazolam. Her primarily concern today is the Shoulder Pain (bilateral) and Back Pain (lower)  Initial Vital Signs:  Pulse Rate: 71 Temp: 97.8 F (36.6 C) Resp: 16 BP: (!) 143/87 SpO2: 100 % ETCO2: 16  BMI: Estimated body mass index is 23.87 kg/m as calculated from the following:   Height as of this encounter: 5\' 8"  (1.727 m).   Weight as of this encounter: 157 lb (71.2 kg).  Risk Assessment: Allergies: Reviewed. She is allergic to flexeril [cyclobenzaprine hcl] and vancomycin.  Allergy Precautions: None required Coagulopathies: Reviewed. None identified.  Blood-thinner therapy: None at this time Active Infection(s): Reviewed. None identified. Kristin [name] is afebrile  Site Confirmation: Kristin [name] was asked to confirm the procedure and laterality before marking the site Procedure checklist: Completed Consent: Before the procedure and under the influence of no sedative(s), amnesic(s), or anxiolytics, the patient was informed of the treatment options, risks and possible complications. To fulfill our ethical and legal obligations, as recommended by the American Medical Association's Code of Ethics, I have informed the patient of my clinical impression; the nature and purpose of the treatment or procedure; the risks, benefits, and possible complications of the intervention; the alternatives, including doing nothing; the risk(s) and benefit(s) of the alternative treatment(s) or procedure(s); and the risk(s) and benefit(s) of doing nothing. The patient was provided information about the general risks and possible complications associated with  the procedure. These may include, but are not limited to: failure to achieve desired goals, infection, bleeding, organ or nerve damage, allergic reactions, paralysis, and death. In addition, the patient was informed of those  risks and complications associated to Spine-related procedures, such as failure to decrease pain; infection (i.e.: Meningitis, epidural or intraspinal abscess); bleeding (i.e.: epidural hematoma, subarachnoid hemorrhage, or any other type of intraspinal or peri-dural bleeding); organ or nerve damage (i.e.: Any type of peripheral nerve, nerve root, or spinal cord injury) with subsequent damage to sensory, motor, and/or autonomic systems, resulting in permanent pain, numbness, and/or weakness of one or several areas of the body; allergic reactions; (i.e.: anaphylactic reaction); and/or death. Furthermore, the patient was informed of those risks and complications associated with the medications. These include, but are not limited to: allergic reactions (i.e.: anaphylactic or anaphylactoid reaction(s)); adrenal axis suppression; blood sugar elevation that in diabetics may result in ketoacidosis or comma; water retention that in patients with history of congestive heart failure may result in shortness of breath, pulmonary edema, and decompensation with resultant heart failure; weight gain; swelling or edema; medication-induced neural toxicity; particulate matter embolism and blood vessel occlusion with resultant organ, and/or nervous system infarction; and/or aseptic necrosis of one or more joints. Finally, the patient was informed that Medicine is not an exact science; therefore, there is also the possibility of unforeseen or unpredictable risks and/or possible complications that may result in a catastrophic outcome. The patient indicated having understood very clearly. We have given the patient no guarantees and we have made no promises. Enough time was given to the patient to ask questions, all  of which were answered to the patient's satisfaction. Kristin [name] has indicated that she wanted to continue with the procedure. Attestation: I, the ordering provider, attest that I have discussed with the patient the benefits, risks, side-effects, alternatives, likelihood of achieving goals, and potential problems during recovery for the procedure that I have provided informed consent. Date: 09/15/2017; Time: 5:33 PM  Pre-Procedure Preparation:  Monitoring: As per clinic protocol. Respiration, ETCO2, SpO2, BP, heart rate and rhythm monitor placed and checked for adequate function Safety Precautions: Patient was assessed for positional comfort and pressure points before starting the procedure. Time-out: I initiated and conducted the "Time-out" before starting the procedure, as per protocol. The patient was asked to participate by confirming the accuracy of the "Time Out" information. Verification of the correct person, site, and procedure were performed and confirmed by me, the nursing staff, and the patient. "Time-out" conducted as per Joint Commission's Universal Protocol (UP.01.01.01). "Time-out" Date & Time: 09/15/2017; 1055 hrs.  Description of Procedure Process:   Position: Prone with head of the table was raised to facilitate breathing. Target Area: For Epidural Steroid injections the target is the interlaminar space, initially targeting the lower border of the superior vertebral body lamina. Approach: Paramedial approach. Area Prepped: Entire PosteriorCervical Region Prepping solution: ChloraPrep (2% chlorhexidine gluconate and 70% isopropyl alcohol) Safety Precautions: Aspiration looking for blood return was conducted prior to all injections. At no point did we inject any substances, as a needle was being advanced. No attempts were made at seeking any paresthesias. Safe injection practices and needle disposal techniques used. Medications properly checked for expiration dates. SDV (single dose  vial) medications used. Description of the Procedure: Protocol guidelines were followed. The procedure needle was introduced through the skin, ipsilateral to the reported pain, and advanced to the target area. Bone was contacted and the needle walked caudad, until the lamina was cleared. The epidural space was identified using "loss-of-resistance technique" with 2-3 ml of PF-NaCl (0.9% NSS), in a 5cc LOR glass syringe. Vitals:   09/15/17 1100 09/15/17 1105 09/15/17 1115 09/15/17 1125  BP: Marland Kitchen)  137/93 (!) 142/90 (!) 161/89 (!) 158/86  Pulse: 82 81 62 70  Resp: (!) 8 10 12 12   Temp:      SpO2: 100% 100% 98% 98%  Weight:      Height:        Start Time: 1055 hrs. End Time: 1101 hrs. Materials:  Needle(s) Type: Epidural needle Gauge: 17G Length: 3.5-in Medication(s): We administered midazolam, fentaNYL, lactated ringers, iopamidol, lidocaine, sodium chloride flush, ropivacaine (PF) 2 mg/mL (0.2%), and dexamethasone. Please see chart orders for dosing details.  Imaging Guidance (Spinal):  Type of Imaging Technique: Fluoroscopy Guidance (Spinal) Indication(s): Assistance in needle guidance and placement for procedures requiring needle placement in or near specific anatomical locations not easily accessible without such assistance. Exposure Time: Please see nurses notes. Contrast: Before injecting any contrast, we confirmed that the patient did not have an allergy to iodine, shellfish, or radiological contrast. Once satisfactory needle placement was completed at the desired level, radiological contrast was injected. Contrast injected under live fluoroscopy. No contrast complications. See chart for type and volume of contrast used. Fluoroscopic Guidance: I was personally present during the use of fluoroscopy. "Tunnel Vision Technique" used to obtain the best possible view of the target area. Parallax error corrected before commencing the procedure. "Direction-depth-direction" technique used to  introduce the needle under continuous pulsed fluoroscopy. Once target was reached, antero-posterior, oblique, and lateral fluoroscopic projection used confirm needle placement in all planes. Images permanently stored in EMR. Interpretation: I personally interpreted the imaging intraoperatively. Adequate needle placement confirmed in multiple planes. Appropriate spread of contrast into desired area was observed. No evidence of afferent or efferent intravascular uptake. No intrathecal or subarachnoid spread observed. Permanent images saved into the patient's record.  Antibiotic Prophylaxis:  Indication(s): None identified Antibiotic given: None  Post-operative Assessment:  Post-procedure Vital Signs:  Pulse Rate: 70 Temp: 97.8 F (36.6 C) Resp: 12 BP: (!) 158/86 SpO2: 98 % ETCO2: 14 EBL: None Complications: No immediate post-treatment complications observed by team, or reported by patient. Note: The patient tolerated the entire procedure well. A repeat set of vitals were taken after the procedure and the patient was kept under observation following institutional policy, for this type of procedure. Post-procedural neurological assessment was performed, showing return to baseline, prior to discharge. The patient was provided with post-procedure discharge instructions, including a section on how to identify potential problems. Should any problems arise concerning this procedure, the patient was given instructions to immediately contact us, at any time, without hesitation. In any case, we plan to contact the patient by telephone for a follow-up status report regarding this interventional procedure. Comments:  No additional relevant information.  Plan of Care   Imaging Orders     DG C-Arm 1-60 Min-No Report  Procedure Orders     Cervical Epidural Injection  Medications ordered for procedure: Meds ordered this encounter  Medications  . midazolam (VERSED) 5 MG/5ML injection 1-2 mg    Make  sure Flumazenil is available in the pyxis when using this medication. If oversedation occurs, administer 0.2 mg IV over 15 sec. If after 45 sec no response, administer 0.2 mg again over 1 min; may repeat at 1 min intervals; not to exceed 4 doses (1 mg)  . fentaNYL (SUBLIMAZE) injection 25-50 mcg    Make sure Narcan is available in the pyxis when using this medication. In the event of respiratory depression (RR< 8/min): Titrate NARCAN (naloxone) in increments of 0.1 to 0.2 mg IV at 2-3 minute intervals, until desired  degree of reversal.  . lactated ringers infusion 1,000 mL  . iopamidol (ISOVUE-M) 41 % intrathecal injection 10 mL    Must be Myelogram-compatible. If not available, you may substitute with a water-soluble, non-ionic, hypoallergenic, myelogram-compatible radiological contrast medium.  Marland Kitchen lidocaine (XYLOCAINE) 2 % (with pres) injection 200 mg  . sodium chloride flush (NS) 0.9 % injection 1 mL  . ropivacaine (PF) 2 mg/mL (0.2%) (NAROPIN) injection 1 mL  . dexamethasone (DECADRON) injection 10 mg   Medications administered: We administered midazolam, fentaNYL, lactated ringers, iopamidol, lidocaine, sodium chloride flush, ropivacaine (PF) 2 mg/mL (0.2%), and dexamethasone.  See the medical record for exact dosing, route, and time of administration.  New Prescriptions   No medications on file   Disposition: Discharge home  Discharge Date & Time: 09/15/2017; 1133 hrs.   Physician-requested Follow-up: Return for post-procedure eval (2 wks), w/ Dr. Dossie Arbour.  Future Appointments  Date Time Provider [place]  10/03/2017  2:00 PM Milinda Pointer, MD [facility] None  11/01/2017 11:15 AM Vevelyn Francois, NP [facility] None  01/18/2018 10:20 AM Mikey College, NP [facility] None   Primary Care Physician: Mikey College, NP Location: [facility] Note by: Gaspar Cola, MD Date: 09/15/2017; Time: 12:41 PM  Disclaimer:  Medicine is  not an Chief Strategy Officer. The only guarantee in medicine is that nothing is guaranteed. It is important to note that the decision to proceed with this intervention was based on the information collected from the patient. The Data and conclusions were drawn from the patient's questionnaire, the interview, and the physical examination. Because the information was provided in large part by the patient, it cannot be guaranteed that it has not been purposely or unconsciously manipulated. Every effort has been made to obtain as much relevant data as possible for this evaluation. It is important to note that the conclusions that lead to this procedure are derived in large part from the available data. Always take into account that the treatment will also be dependent on availability of resources and existing treatment guidelines, considered by other Pain Management Practitioners as being common knowledge and practice, at the time of the intervention. For Medico-Legal purposes, it is also important to point out that variation in procedural techniques and pharmacological choices are the acceptable norm. The indications, contraindications, technique, and results of the above procedure should only be interpreted and judged by a Board-Certified Interventional Pain Specialist with extensive familiarity and expertise in the same exact procedure and technique.

## 2017-09-15 ENCOUNTER — Ambulatory Visit
Admission: RE | Admit: 2017-09-15 | Discharge: 2017-09-15 | Disposition: A | Discharge status: Home / Self Care | Payer: Medicare Other | Source: Ambulatory Visit | Attending: Pain Medicine | Admitting: Pain Medicine

## 2017-09-15 ENCOUNTER — Other Ambulatory Visit: Payer: Self-pay

## 2017-09-15 ENCOUNTER — Ambulatory Visit (HOSPITAL_BASED_OUTPATIENT_CLINIC_OR_DEPARTMENT_OTHER): Payer: Medicare Other | Admitting: Pain Medicine

## 2017-09-15 ENCOUNTER — Encounter: Payer: Self-pay | Admitting: Pain Medicine

## 2017-09-15 VITALS — BP 158/86 | HR 70 | Temp 97.8°F | Resp 12 | Ht 68.0 in | Wt 157.0 lb

## 2017-09-15 DIAGNOSIS — Z79891 Long term (current) use of opiate analgesic: Secondary | ICD-10-CM | POA: Diagnosis not present

## 2017-09-15 DIAGNOSIS — G8929 Other chronic pain: Secondary | ICD-10-CM | POA: Insufficient documentation

## 2017-09-15 DIAGNOSIS — M25512 Pain in left shoulder: Secondary | ICD-10-CM | POA: Insufficient documentation

## 2017-09-15 DIAGNOSIS — Z9849 Cataract extraction status, unspecified eye: Secondary | ICD-10-CM | POA: Insufficient documentation

## 2017-09-15 DIAGNOSIS — M4312 Spondylolisthesis, cervical region: Secondary | ICD-10-CM | POA: Insufficient documentation

## 2017-09-15 DIAGNOSIS — M488X2 Other specified spondylopathies, cervical region: Secondary | ICD-10-CM | POA: Insufficient documentation

## 2017-09-15 DIAGNOSIS — Z96611 Presence of right artificial shoulder joint: Secondary | ICD-10-CM | POA: Insufficient documentation

## 2017-09-15 DIAGNOSIS — Z7901 Long term (current) use of anticoagulants: Secondary | ICD-10-CM | POA: Insufficient documentation

## 2017-09-15 DIAGNOSIS — M545 Low back pain: Secondary | ICD-10-CM | POA: Diagnosis present

## 2017-09-15 DIAGNOSIS — Z888 Allergy status to other drugs, medicaments and biological substances status: Secondary | ICD-10-CM | POA: Insufficient documentation

## 2017-09-15 DIAGNOSIS — M4310 Spondylolisthesis, site unspecified: Secondary | ICD-10-CM

## 2017-09-15 DIAGNOSIS — M5412 Radiculopathy, cervical region: Secondary | ICD-10-CM | POA: Insufficient documentation

## 2017-09-15 DIAGNOSIS — M25511 Pain in right shoulder: Secondary | ICD-10-CM | POA: Diagnosis not present

## 2017-09-15 DIAGNOSIS — M4802 Spinal stenosis, cervical region: Secondary | ICD-10-CM

## 2017-09-15 DIAGNOSIS — Z79899 Other long term (current) drug therapy: Secondary | ICD-10-CM | POA: Insufficient documentation

## 2017-09-15 DIAGNOSIS — F419 Anxiety disorder, unspecified: Secondary | ICD-10-CM | POA: Insufficient documentation

## 2017-09-15 DIAGNOSIS — M47812 Spondylosis without myelopathy or radiculopathy, cervical region: Secondary | ICD-10-CM

## 2017-09-15 DIAGNOSIS — Z9049 Acquired absence of other specified parts of digestive tract: Secondary | ICD-10-CM | POA: Insufficient documentation

## 2017-09-15 DIAGNOSIS — Z950 Presence of cardiac pacemaker: Secondary | ICD-10-CM | POA: Diagnosis not present

## 2017-09-15 DIAGNOSIS — Z90710 Acquired absence of both cervix and uterus: Secondary | ICD-10-CM | POA: Diagnosis not present

## 2017-09-15 DIAGNOSIS — Z881 Allergy status to other antibiotic agents status: Secondary | ICD-10-CM | POA: Diagnosis not present

## 2017-09-15 MED ORDER — MIDAZOLAM HCL 5 MG/5ML IJ SOLN
1.0000 mg | INTRAMUSCULAR | Status: DC | PRN
  Administered 2017-09-15: 1 mg via INTRAVENOUS
  Filled 2017-09-15: qty 5

## 2017-09-15 MED ORDER — SODIUM CHLORIDE 0.9% FLUSH
1.0000 mL | Freq: Once | INTRAVENOUS | Status: AC
  Administered 2017-09-15: 1 mL

## 2017-09-15 MED ORDER — DEXAMETHASONE SODIUM PHOSPHATE 10 MG/ML IJ SOLN
10.0000 mg | Freq: Once | INTRAMUSCULAR | Status: AC
  Administered 2017-09-15: 10 mg
  Filled 2017-09-15: qty 1

## 2017-09-15 MED ORDER — LIDOCAINE HCL 2 % IJ SOLN
10.0000 mL | Freq: Once | INTRAMUSCULAR | Status: AC
  Administered 2017-09-15: 200 mg
  Filled 2017-09-15: qty 40

## 2017-09-15 MED ORDER — IOPAMIDOL (ISOVUE-M 200) INJECTION 41%
10.0000 mL | Freq: Once | INTRAMUSCULAR | Status: AC
  Administered 2017-09-15: 1 mL via EPIDURAL
  Filled 2017-09-15: qty 10

## 2017-09-15 MED ORDER — FENTANYL CITRATE (PF) 100 MCG/2ML IJ SOLN
25.0000 ug | INTRAMUSCULAR | Status: DC | PRN
  Administered 2017-09-15: 50 ug via INTRAVENOUS
  Filled 2017-09-15: qty 2

## 2017-09-15 MED ORDER — ROPIVACAINE HCL 2 MG/ML IJ SOLN
1.0000 mL | Freq: Once | INTRAMUSCULAR | Status: AC
  Administered 2017-09-15: 1 mL via EPIDURAL
  Filled 2017-09-15: qty 10

## 2017-09-15 MED ORDER — LACTATED RINGERS IV SOLN
1000.0000 mL | Freq: Once | INTRAVENOUS | Status: AC
  Administered 2017-09-15: 1000 mL via INTRAVENOUS

## 2017-09-15 NOTE — Patient Instructions (Signed)

## 2017-09-15 NOTE — Progress Notes (Signed)
Safety precautions to be maintained throughout the outpatient stay will include: orient to surroundings, keep bed in low position, maintain call bell within reach at all times, provide assistance with transfer out of bed and ambulation.  

## 2017-09-16 ENCOUNTER — Ambulatory Visit: Payer: Medicare Other | Admitting: Nurse Practitioner

## 2017-09-16 ENCOUNTER — Telehealth: Payer: Self-pay | Admitting: *Deleted

## 2017-09-16 NOTE — Telephone Encounter (Signed)
Attempted to call for post procedure follow-up. Message left. 

## 2017-09-21 ENCOUNTER — Ambulatory Visit: Payer: Medicare Other | Admitting: Nurse Practitioner

## 2017-09-22 ENCOUNTER — Other Ambulatory Visit: Payer: Self-pay

## 2017-09-22 ENCOUNTER — Ambulatory Visit (INDEPENDENT_AMBULATORY_CARE_PROVIDER_SITE_OTHER): Payer: Medicare Other | Admitting: Nurse Practitioner

## 2017-09-22 ENCOUNTER — Encounter: Payer: Self-pay | Admitting: Nurse Practitioner

## 2017-09-22 VITALS — BP 137/77 | HR 68 | Temp 98.4°F | Ht 68.0 in | Wt 159.0 lb

## 2017-09-22 DIAGNOSIS — N179 Acute kidney failure, unspecified: Secondary | ICD-10-CM | POA: Diagnosis not present

## 2017-09-22 DIAGNOSIS — I10 Essential (primary) hypertension: Secondary | ICD-10-CM

## 2017-09-22 NOTE — Patient Instructions (Addendum)
Kristin [name], Thank you for coming in to clinic today.  1. Continue all medications without changes.   2. Continue daily BP readings and weights - continue drinking 1.5-2  32oz containers of water.   Please schedule a follow-up appointment with [name], AGNP. Return in about 6 months (around 03/22/2018) for Hypertension, GERD.  If you have any other questions or concerns, please feel free to call the clinic or send a message through [place]. You may also schedule an earlier appointment if necessary.  You will receive a survey after today's visit either digitally by e-mail or paper by [employer]. Your experiences and feedback matter to [name].  Please respond so we know how we are doing as we provide care for you.   [name], DNP, AGNP-BC Adult Gerontology Nurse Practitioner [facility]

## 2017-09-22 NOTE — Progress Notes (Signed)
Subjective:    [name], female    DOB: Jul 22, 1930, 84 y.o.   MRN: [id number]  Kristin [name] is a 84 y.o. female presenting on 09/22/2017 for Hypertension (kidney function)   HPI Hypertension - She is checking BP at home or outside of clinic.  Readings 96/50 - 130/80 - Current medications: lisinopril 5 mg and furosemide 10 mg once daily, tolerating well without side effects  - Had previously had dehydration and has  She drinks between 48-64 oz water every day. - She is not currently symptomatic. - Pt denies headache, lightheadedness, dizziness, changes in vision, chest tightness/pressure, palpitations, leg swelling, sudden loss of speech or loss of consciousness. - She has no regular exercise, but is taking care of her own housework.  She tries to walk regularly, but has done so less often with cold weather. - Her diet is low in salt, moderate in fat, and moderate in carbohydrates.  AKI Resolved on last lab check 08/22/17 w/ Creatinine and GFR returned to baseline.  Pt reports feeling much better with increasing her water intake and resolving her dehydration.  Social History   Tobacco Use  . Smoking status: Former Smoker    Packs/day: 1.25    Years: 40.00    Pack years: 50.00    Types: Cigarettes    Last attempt to quit: 12/16/1998    Years since quitting: 18.7  . Smokeless tobacco: Never Used  Substance Use Topics  . Alcohol use: No    Alcohol/week: 0.0 oz  . Drug use: No    Review of Systems Per HPI unless specifically indicated above     Objective:    BP 137/77 (BP Location: Right Arm, Patient Position: Sitting, Cuff Size: Normal)   Pulse 68   Temp 98.4 F (36.9 C) (Oral)   Ht 5\' 8"  (1.727 m)   Wt 159 lb (72.1 kg)   BMI 24.18 kg/m   Wt Readings from Last 3 Encounters:  09/22/17 159 lb (72.1 kg)  09/15/17 157 lb (71.2 kg)  09/07/17 156 lb (70.8 kg)    Physical Exam  General - healthy, well-appearing, NAD HEENT - Normocephalic,  atraumatic Neck - supple, non-tender, no LAD, no thyromegaly, no carotid bruit Heart - RRR, no murmurs heard Lungs - Clear throughout all lobes, no wheezing, crackles, or rhonchi. Normal work of breathing. Extremeties - non-tender, no edema, cap refill < 2 seconds, peripheral pulses intact +2 bilaterally Skin - warm, dry Neuro - awake, alert, oriented x3, antalgic gait - walking with single prong cane for stability Psych - Normal mood and affect, normal behavior    Results for orders placed or performed during the hospital encounter of 08/22/17  Comprehensive metabolic panel  Result Value Ref Range   Sodium 139 135 - 145 mmol/L   Potassium 4.9 3.5 - 5.1 mmol/L   Chloride 107 101 - 111 mmol/L   CO2 25 22 - 32 mmol/L   Glucose, Bld 73 65 - 99 mg/dL   BUN 19 6 - 20 mg/dL   Creatinine, Ser 1.09 (H) 0.44 - 1.00 mg/dL   Calcium 9.7 8.9 - 10.3 mg/dL   Total Protein 6.6 6.5 - 8.1 g/dL   Albumin 3.6 3.5 - 5.0 g/dL   AST 23 15 - 41 U/L   ALT 11 (L) 14 - 54 U/L   Alkaline Phosphatase 60 38 - 126 U/L   Total Bilirubin 0.5 0.3 - 1.2 mg/dL   GFR calc non Af Amer 45 (L) >  60 mL/min   GFR calc Af Amer 52 (L) >60 mL/min   Anion gap 7 5 - 15      Assessment & Plan:   Problem List Items Addressed This Visit      Cardiovascular and Mediastinum   Benign essential HTN - Primary    Controlled and at goal today.  Pt taking lisinopril 5 mg once daily and furosemide 10 mg once daily.  History of gout - avoid HCTZ.  Plan: 1. Continue taking medicines without changes.  AKI has resolved and has stable BP and stable daily weights between 156-158 lbs. 2. Labs stable for kidney and liver function.  3. Encouraged heart healthy diet and continued effort to increase exercise. 4. Check BP 1-2 x per week at home, keep log, and bring to clinic at next appointment. 5. Follow up 6 months.          Genitourinary   RESOLVED: AKI (acute kidney injury) ([place]) AKI now resolved with last kidney function back to  baseline.  Pt has continued having good urine output and remains hydrated with drinking 48-64 oz water daily. Plan: - Continue drinking plenty of water between 48-64 oz daily.  Encouraged pt not to drink more than 64 oz as that can complicate HF control. - Continue management of Hypertension. - Followup for CKD monitoring every 6 months.       Follow up plan: Return in about 6 months (around 03/22/2018) for Hypertension, GERD.  [name], DNP, AGPCNP-BC Adult Gerontology Primary Care Nurse Practitioner [facility] 09/22/2017, 1:56 PM

## 2017-09-22 NOTE — Assessment & Plan Note (Signed)
Controlled and at goal today.  Pt taking lisinopril 5 mg once daily and furosemide 10 mg once daily.  History of gout - avoid HCTZ.  Plan: 1. Continue taking medicines without changes.  AKI has resolved and has stable BP and stable daily weights between 156-158 lbs. 2. Labs stable for kidney and liver function.  3. Encouraged heart healthy diet and continued effort to increase exercise. 4. Check BP 1-2 x per week at home, keep log, and bring to clinic at next appointment. 5. Follow up 6 months.

## 2017-10-03 ENCOUNTER — Ambulatory Visit: Payer: Medicare Other | Attending: Pain Medicine | Admitting: Pain Medicine

## 2017-10-03 ENCOUNTER — Encounter: Payer: Self-pay | Admitting: Pain Medicine

## 2017-10-03 VITALS — BP 139/88 | HR 83 | Temp 98.4°F | Resp 16 | Ht 68.0 in | Wt 157.0 lb

## 2017-10-03 DIAGNOSIS — M79605 Pain in left leg: Secondary | ICD-10-CM | POA: Diagnosis not present

## 2017-10-03 DIAGNOSIS — Z79899 Other long term (current) drug therapy: Secondary | ICD-10-CM | POA: Insufficient documentation

## 2017-10-03 DIAGNOSIS — E785 Hyperlipidemia, unspecified: Secondary | ICD-10-CM | POA: Insufficient documentation

## 2017-10-03 DIAGNOSIS — M542 Cervicalgia: Secondary | ICD-10-CM | POA: Diagnosis not present

## 2017-10-03 DIAGNOSIS — M79604 Pain in right leg: Secondary | ICD-10-CM

## 2017-10-03 DIAGNOSIS — B1920 Unspecified viral hepatitis C without hepatic coma: Secondary | ICD-10-CM | POA: Diagnosis not present

## 2017-10-03 DIAGNOSIS — I4891 Unspecified atrial fibrillation: Secondary | ICD-10-CM | POA: Insufficient documentation

## 2017-10-03 DIAGNOSIS — M48061 Spinal stenosis, lumbar region without neurogenic claudication: Secondary | ICD-10-CM | POA: Diagnosis not present

## 2017-10-03 DIAGNOSIS — G2581 Restless legs syndrome: Secondary | ICD-10-CM | POA: Insufficient documentation

## 2017-10-03 DIAGNOSIS — M9981 Other biomechanical lesions of cervical region: Secondary | ICD-10-CM

## 2017-10-03 DIAGNOSIS — J449 Chronic obstructive pulmonary disease, unspecified: Secondary | ICD-10-CM | POA: Insufficient documentation

## 2017-10-03 DIAGNOSIS — N183 Chronic kidney disease, stage 3 (moderate): Secondary | ICD-10-CM | POA: Insufficient documentation

## 2017-10-03 DIAGNOSIS — M5441 Lumbago with sciatica, right side: Secondary | ICD-10-CM | POA: Diagnosis not present

## 2017-10-03 DIAGNOSIS — M5412 Radiculopathy, cervical region: Secondary | ICD-10-CM

## 2017-10-03 DIAGNOSIS — I341 Nonrheumatic mitral (valve) prolapse: Secondary | ICD-10-CM | POA: Diagnosis not present

## 2017-10-03 DIAGNOSIS — M4802 Spinal stenosis, cervical region: Secondary | ICD-10-CM | POA: Insufficient documentation

## 2017-10-03 DIAGNOSIS — M5442 Lumbago with sciatica, left side: Secondary | ICD-10-CM | POA: Diagnosis not present

## 2017-10-03 DIAGNOSIS — I5032 Chronic diastolic (congestive) heart failure: Secondary | ICD-10-CM | POA: Insufficient documentation

## 2017-10-03 DIAGNOSIS — Z950 Presence of cardiac pacemaker: Secondary | ICD-10-CM | POA: Diagnosis not present

## 2017-10-03 DIAGNOSIS — G8929 Other chronic pain: Secondary | ICD-10-CM | POA: Diagnosis not present

## 2017-10-03 DIAGNOSIS — G894 Chronic pain syndrome: Secondary | ICD-10-CM | POA: Diagnosis present

## 2017-10-03 DIAGNOSIS — E559 Vitamin D deficiency, unspecified: Secondary | ICD-10-CM | POA: Insufficient documentation

## 2017-10-03 DIAGNOSIS — Z881 Allergy status to other antibiotic agents status: Secondary | ICD-10-CM | POA: Diagnosis not present

## 2017-10-03 DIAGNOSIS — K219 Gastro-esophageal reflux disease without esophagitis: Secondary | ICD-10-CM | POA: Diagnosis not present

## 2017-10-03 DIAGNOSIS — Z7901 Long term (current) use of anticoagulants: Secondary | ICD-10-CM | POA: Insufficient documentation

## 2017-10-03 DIAGNOSIS — Z87891 Personal history of nicotine dependence: Secondary | ICD-10-CM | POA: Insufficient documentation

## 2017-10-03 DIAGNOSIS — M109 Gout, unspecified: Secondary | ICD-10-CM | POA: Insufficient documentation

## 2017-10-03 NOTE — Progress Notes (Signed)
Safety precautions to be maintained throughout the outpatient stay will include: orient to surroundings, keep bed in low position, maintain call bell within reach at all times, provide assistance with transfer out of bed and ambulation.  

## 2017-10-03 NOTE — Progress Notes (Signed)
Patient's Name: Kristin [name]  MRN: [id number]  Referring Provider: Mikey College, *  DOB: June 10, 1930  PCP: [facility]  DOS: 10/03/2017  Note by: Gaspar Cola, MD  Service setting: Ambulatory outpatient  Specialty: Interventional Pain Management  Location: [facility] (AMB) Pain Management Facility    Patient type: Established   Primary Reason(s) for Visit: Encounter for post-procedure evaluation of chronic illness with mild to moderate exacerbation CC: Back Pain (lower bilateral mostly on the left) and Shoulder Pain (bilateral)  HPI  Kristin [name] is a 84 y.o. year old, female patient, who comes today for a post-procedure evaluation. She has Chronic kidney disease (CKD), stage III (moderate) (HCC); DDD (degenerative disc disease), lumbosacral; Gout; Benign essential HTN; Lumbar central spinal stenosis; Chronic obstructive pulmonary disease ([place]); Chemical diabetes; History of cardiac catheterization; Seasonal allergies; History of breast cancer; Chronic pain syndrome; Chronic low back pain (Primary Source of Pain) (Bilateral) (L>R); Lumbar facet syndrome (Bilateral) (L>R); Lumbar spondylosis; Chronic lumbar radicular pain (Bilateral) (L>R) (L5 Dermatome); Restless leg syndrome; Myofascial pain; Neurogenic pain; Long term current use of opiate analgesic; Long term prescription opiate use; Opiate use (90 MME/Day); Opioid-induced constipation (OIC); Vitamin D insufficiency; Grade 1 Retrolisthesis of L1 over L2; Lumbar facet hypertrophy (L1-2, L2-3, and L4-5); Lumbar lateral recess stenosis (L1-2 and L4-5); 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); Epidural fibrosis; Lumbar foraminal stenosis (Severe Left L4-5); 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); Osteoporosis; Chronic sacroiliac joint pain (Left); History of methicillin resistant staphylococcus aureus (MRSA); Mitral valve prolapse; GERD (gastroesophageal reflux disease); Chronic shoulder pain  (S/P replacement) (Right); Osteoarthritis, multiple sites; Shoulder pain S/P prostheses (Right); Chronic diastolic CHF (congestive heart failure) ([place]); S/P cardiac pacemaker procedure; Anticoagulated; Hyperlipidemia, unspecified; Chronic neck pain; Chronic hip pain (Bilateral); Chronic cervical radicular pain (Tertiary Area of Pain) (Bilateral) (L>R); Long term current use of anticoagulant; Chronic lower extremity pain (Secondary Area of Pain) (Bilateral) (L>R); DDD (degenerative disc disease), cervical; Numbness and tingling of upper extremity (Bilateral); Radicular pain of shoulder (Bilateral) (L>R); Cervical facet syndrome (Bilateral) (L>R); Cervical (3-4 mm) Anterolisthesis of C4 on C5; and Cervical foraminal stenosis (Bilateral) on their problem list. Her primarily concern today is the Back Pain (lower bilateral mostly on the left) and Shoulder Pain (bilateral)  Pain Assessment: Location: Lower, Left, Right(bilateral) Back(shoulders) Radiating: na Onset: More than a month ago Duration: Chronic pain Quality: Discomfort, Constant Severity: 4 /10 (self-reported pain score)  Note: Reported level is compatible with observation.                         When using our objective Pain Scale, levels between 6 and 10/10 are said to belong in an emergency room, as it progressively worsens from a 6/10, described as severely limiting, requiring emergency care not usually available at an outpatient pain management facility. At a 6/10 level, communication becomes difficult and requires great effort. Assistance to reach the emergency department may be required. Facial flushing and profuse sweating along with potentially dangerous increases in heart rate and blood pressure will be evident. Effect on ADL: right shoulder is very limted with ROM Timing: Constant Modifying factors: sitting down relieves pain.  shoulder pain is constant no matter what activity  Kristin [name] comes in today for post-procedure evaluation  after the treatment done on 09/15/2017.  Further details on both, my assessment(s), as well as the proposed treatment plan, please see below.  Post-Procedure Assessment  09/15/2017 Procedure: Diagnostic left-sided cervical epidural  steroid injection #1 under fluoroscopic guidance and IV sedation Pre-procedure pain score:  6/10 Post-procedure pain score: 0/10 (100% relief) Influential Factors: BMI: 23.87 kg/m Intra-procedural challenges: None observed.         Assessment challenges: None detected.              Reported side-effects: None.        Post-procedural adverse reactions or complications: None reported         Sedation: Sedation provided. When no sedatives are used, the analgesic levels obtained are directly associated to the effectiveness of the local anesthetics. However, when sedation is provided, the level of analgesia obtained during the initial 1 hour following the intervention, is believed to be the result of a combination of factors. These factors may include, but are not limited to: 1. The effectiveness of the local anesthetics used. 2. The effects of the analgesic(s) and/or anxiolytic(s) used. 3. The degree of discomfort experienced by the patient at the time of the procedure. 4. The patients ability and reliability in recalling and recording the events. 5. The presence and influence of possible secondary gains and/or psychosocial factors. Reported result: Relief experienced during the 1st hour after the procedure: 100 % (Ultra-Short Term Relief)            Interpretative annotation: Clinically appropriate result. Analgesia during this period is likely to be Local Anesthetic and/or IV Sedative (Analgesic/Anxiolytic) related.          Effects of local anesthetic: The analgesic effects attained during this period are directly associated to the localized infiltration of local anesthetics and therefore cary significant diagnostic value as to the etiological location, or anatomical  origin, of the pain. Expected duration of relief is directly dependent on the pharmacodynamics of the local anesthetic used. Long-acting (4-6 hours) anesthetics used.  Reported result: Relief during the next 4 to 6 hour after the procedure: 100 % (Short-Term Relief)            Interpretative annotation: Clinically appropriate result. Analgesia during this period is likely to be Local Anesthetic-related.          Long-term benefit: Defined as the period of time past the expected duration of local anesthetics (1 hour for short-acting and 4-6 hours for long-acting). With the possible exception of prolonged sympathetic blockade from the local anesthetics, benefits during this period are typically attributed to, or associated with, other factors such as analgesic sensory neuropraxia, antiinflammatory effects, or beneficial biochemical changes provided by agents other than the local anesthetics.  Reported result: Extended relief following procedure: 100 %(pain relief wore off after about 1 week. ) (Long-Term Relief)            Interpretative annotation: Clinically appropriate result. Good relief. No permanent benefit expected. Inflammation plays a part in the etiology to the pain.          Current benefits: Defined as reported results that persistent at this point in time.   Analgesia: <50 %            Function: Somewhat improved ROM: Somewhat improved Interpretative annotation: Recurrence of symptoms. No permanent benefit expected. Effective diagnostic intervention.          Interpretation: Results would suggest a successful diagnostic intervention.                  Plan:  Proceed with treatment No.: 2  Laboratory Chemistry  Inflammation Markers (CRP: Acute Phase) (ESR: Chronic Phase) Lab Results  Component Value Date   CRP <  0.5 09/24/2015   ESRSEDRATE 12 09/24/2015                 Renal Function Markers Lab Results  Component Value Date   BUN 19 08/22/2017   CREATININE 1.09 (H) 08/22/2017    GFRAA 52 (L) 08/22/2017   GFRNONAA 45 (L) 08/22/2017                 Hepatic Function Markers Lab Results  Component Value Date   AST 23 08/22/2017   ALT 11 (L) 08/22/2017   ALBUMIN 3.6 08/22/2017   ALKPHOS 60 08/22/2017                 Electrolytes Lab Results  Component Value Date   NA 139 08/22/2017   K 4.9 08/22/2017   CL 107 08/22/2017   CALCIUM 9.7 08/22/2017   MG 2.2 09/24/2015                 Coagulation Parameters Lab Results  Component Value Date   INR 1.24 12/16/2015   LABPROT 15.8 (H) 12/16/2015   APTT 26 12/16/2015   PLT 139 (L) 08/15/2017                 Cardiovascular Markers Lab Results  Component Value Date   TROPONINI <0.03 08/14/2017   HGB 11.6 (L) 08/15/2017   HCT 34.8 (L) 08/15/2017                 CA Markers Lab Results  Component Value Date   LABCA2 49.4 (H) 07/10/2013                 Note: Lab results reviewed.  Recent Diagnostic Imaging Results  DG C-Arm 1-60 Min-No Report Fluoroscopy was utilized by the requesting physician.  No radiographic  interpretation.   Complexity Note: Imaging results reviewed. Results shared with Kristin [name], using Layman's terms.                         Meds   Current Outpatient Medications:  .  albuterol (PROVENTIL) (2.5 MG/3ML) 0.083% nebulizer solution, Take 3 mLs (2.5 mg total) by nebulization every 8 (eight) hours as needed. Shortness of breath and wheezing, Disp: 75 mL, Rfl: 12 .  apixaban (ELIQUIS) 5 MG TABS tablet, Take 1 tablet (5 mg total) by mouth 2 (two) times daily., Disp: 60 tablet, Rfl: 0 .  Cholecalciferol (VITAMIN D) 2000 units tablet, Take 2,000 Units by mouth daily. , Disp: , Rfl:  .  furosemide (LASIX) 20 MG tablet, Take 0.5 tablets (10 mg total) by mouth daily., Disp: 45 tablet, Rfl: 1 .  levocetirizine (XYZAL) 5 MG tablet, Take 1 tablet (5 mg total) by mouth every evening., Disp: 30 tablet, Rfl: 12 .  lisinopril (PRINIVIL,ZESTRIL) 10 MG tablet, Take 5 mg by mouth daily., Disp:  , Rfl:  .  Melatonin 10 MG CAPS, Take 20 mg by mouth at bedtime as needed., Disp: 60 capsule, Rfl: 2 .  [START ON 10/09/2017] Oxycodone HCl 20 MG TABS, Take 1 tablet (20 mg total) by mouth every 8 (eight) hours as needed., Disp: 90 tablet, Rfl: 0 .  Oxycodone HCl 20 MG TABS, Take 1 tablet (20 mg total) by mouth every 8 (eight) hours as needed., Disp: 90 tablet, Rfl: 0 .  OXYGEN, Inhale 2 L into the lungs at bedtime. , Disp: , Rfl:  .  bisacodyl (DULCOLAX) 5 MG EC tablet, Take 2 tablets (10 mg total) by  mouth daily as needed for moderate constipation., Disp: 60 tablet, Rfl: 2 .  Oxycodone HCl 20 MG TABS, Take 1 tablet (20 mg total) by mouth every 8 (eight) hours as needed., Disp: 90 tablet, Rfl: 0 .  polyethylene glycol (MIRALAX / GLYCOLAX) packet, Take 17 g by mouth daily as needed for moderate constipation., Disp: 14 each, Rfl: 2 .  rOPINIRole (REQUIP) 2 MG tablet, Take 1 tablet (2 mg total) by mouth at bedtime., Disp: 30 tablet, Rfl: 2  ROS  Constitutional: Denies any fever or chills Gastrointestinal: No reported hemesis, hematochezia, vomiting, or acute GI distress Musculoskeletal: Denies any acute onset joint swelling, redness, loss of ROM, or weakness Neurological: No reported episodes of acute onset apraxia, aphasia, dysarthria, agnosia, amnesia, paralysis, loss of coordination, or loss of consciousness  Allergies  Kristin [name] is allergic to flexeril [cyclobenzaprine hcl] and vancomycin.  PFSH  Drug: Kristin [name]  reports that she does not use drugs. Alcohol:  reports that she does not drink alcohol. Tobacco:  reports that she quit smoking about 18 years ago. Her smoking use included cigarettes. She has a 50.00 pack-year smoking history. she has never used smokeless tobacco. Medical:  has a past medical history of Arrhythmia, sinus node (04/03/2014), Arthritis, Arthritis, Atrial fibrillation ([place]), Back pain, Bradycardia, Breast cancer ([place]) (2004), Cancer ([place]) (2004), CHF (congestive heart  failure) ([place]), Chronic back pain, CKD (chronic kidney disease), COPD (chronic obstructive pulmonary disease) ([place]), Cystocele, Decubitus ulcers, Dehydration, Gout, Hematuria, Hepatitis C, Hyperlipidemia, Hypertension, Hypomagnesemia (06/25/2015), OAB (overactive bladder), Overactive bladder, Restless leg, Shoulder pain, bilateral, Urinary frequency, UTI (lower urinary tract infection), and Vaginal atrophy. Surgical: Kristin [name]  has a past surgical history that includes Abdominal hysterectomy; Back surgery; Rotator cuff repair; Breast surgery; Cataract extraction w/PHACO (10/19/2011); Cataract extraction w/PHACO (11/02/2011); Carpal tunnel release; Joint replacement; Colon surgery; Pacemaker insertion (N/A, 12/24/2015); Cholecystectomy; Breast lumpectomy (Right, 2004); and Insert / replace / remove pacemaker. Family: family history includes Breast cancer in her paternal aunt; Breast cancer (age of onset: 61) in her sister; Cancer in her sister; Heart disease in her father and mother; Kidney disease in her brother; Stroke in her mother.  Constitutional Exam  General appearance: Well nourished, well developed, and well hydrated. In no apparent acute distress Vitals:   10/03/17 1419  BP: 139/88  Pulse: 83  Resp: 16  Temp: 98.4 F (36.9 C)  TempSrc: Oral  SpO2: 98%  Weight: 157 lb (71.2 kg)  Height: _0  (1.727 m)   BMI Assessment: Estimated body mass index is 23.87 kg/m as calculated from the following:   Height as of this encounter: _1  (1.727 m).   Weight as of this encounter: 157 lb (71.2 kg).  BMI interpretation table: BMI level Category Range association with higher incidence of chronic pain  <18 kg/m2 Underweight   18.5-24.9 kg/m2 Ideal body weight   25-29.9 kg/m2 Overweight Increased incidence by 20%  30-34.9 kg/m2 Obese (Class I) Increased incidence by 68%  35-39.9 kg/m2 Severe obesity (Class II) Increased incidence by 136%  >40 kg/m2 Extreme obesity (Class III) Increased incidence  by 254%   BMI Readings from Last 4 Encounters:  10/03/17 23.87 kg/m  09/22/17 24.18 kg/m  09/15/17 23.87 kg/m  09/07/17 23.72 kg/m   Wt Readings from Last 4 Encounters:  10/03/17 157 lb (71.2 kg)  09/22/17 159 lb (72.1 kg)  09/15/17 157 lb (71.2 kg)  09/07/17 156 lb (70.8 kg)  Psych/Mental status: Alert, oriented x 3 (person, place, & time)  Eyes: PERLA Respiratory: No evidence of acute respiratory distress  Cervical Spine Area Exam  Skin & Axial Inspection: No masses, redness, edema, swelling, or associated skin lesions Alignment: Symmetrical Functional ROM: Improved after treatment      Stability: No instability detected Muscle Tone/Strength: Functionally intact. No obvious neuro-muscular anomalies detected. Sensory (Neurological): Movement-associated discomfort Palpation: No palpable anomalies              Upper Extremity (UE) Exam    Side: Right upper extremity  Side: Left upper extremity  Skin & Extremity Inspection: Skin color, temperature, and hair growth are WNL. No peripheral edema or cyanosis. No masses, redness, swelling, asymmetry, or associated skin lesions. No contractures.  Skin & Extremity Inspection: Skin color, temperature, and hair growth are WNL. No peripheral edema or cyanosis. No masses, redness, swelling, asymmetry, or associated skin lesions. No contractures.  Functional ROM: Limited ROM for shoulder  Functional ROM: Limited ROM for shoulder  Muscle Tone/Strength: Guarding  Muscle Tone/Strength: TEFL teacher (Neurological): Arthropathic arthralgia affecting the shoulder  Sensory (Neurological): Arthropathic arthralgia affecting the shoulder  Palpation: Tender              Palpation: Tender              Specialized Test(s): Deferred         Specialized Test(s): Deferred          Thoracic Spine Area Exam  Skin & Axial Inspection: No masses, redness, or swelling Alignment: Symmetrical Functional ROM: Unrestricted ROM Stability: No instability  detected Muscle Tone/Strength: Functionally intact. No obvious neuro-muscular anomalies detected. Sensory (Neurological): Unimpaired Muscle strength & Tone: No palpable anomalies  Lumbar Spine Area Exam  Skin & Axial Inspection: No masses, redness, or swelling Alignment: Symmetrical Functional ROM: Unrestricted ROM      Stability: No instability detected Muscle Tone/Strength: Functionally intact. No obvious neuro-muscular anomalies detected. Sensory (Neurological): Unimpaired Palpation: No palpable anomalies       Provocative Tests: Lumbar Hyperextension and rotation test: evaluation deferred today       Lumbar Lateral bending test: evaluation deferred today       Patrick's Maneuver: evaluation deferred today                    Gait & Posture Assessment  Ambulation: Unassisted Gait: Relatively normal for age and body habitus Posture: WNL   Lower Extremity Exam    Side: Right lower extremity  Side: Left lower extremity  Skin & Extremity Inspection: Skin color, temperature, and hair growth are WNL. No peripheral edema or cyanosis. No masses, redness, swelling, asymmetry, or associated skin lesions. No contractures.  Skin & Extremity Inspection: Skin color, temperature, and hair growth are WNL. No peripheral edema or cyanosis. No masses, redness, swelling, asymmetry, or associated skin lesions. No contractures.  Functional ROM: Unrestricted ROM          Functional ROM: Unrestricted ROM          Muscle Tone/Strength: Functionally intact. No obvious neuro-muscular anomalies detected.  Muscle Tone/Strength: Functionally intact. No obvious neuro-muscular anomalies detected.  Sensory (Neurological): Unimpaired  Sensory (Neurological): Unimpaired  Palpation: No palpable anomalies  Palpation: No palpable anomalies   Assessment  Primary Diagnosis & Pertinent Problem List: The primary encounter diagnosis was Chronic cervical radicular pain (Tertiary Area of Pain) (Bilateral) (L>R). Diagnoses of  Chronic neck pain, Chronic low back pain (Primary Source of Pain) (Bilateral) (L>R), Chronic lower extremity pain (Secondary Area of Pain) (Bilateral) (L>R), Chronic pain syndrome, and  Cervical foraminal stenosis (Bilateral) were also pertinent to this visit.  Status Diagnosis  Improving Improving Controlled 1. Chronic cervical radicular pain (Tertiary Area of Pain) (Bilateral) (L>R)   2. Chronic neck pain   3. Chronic low back pain (Primary Source of Pain) (Bilateral) (L>R)   4. Chronic lower extremity pain (Secondary Area of Pain) (Bilateral) (L>R)   5. Chronic pain syndrome   6. Cervical foraminal stenosis (Bilateral)     Problems updated and reviewed during this visit: No problems updated. Plan of Care  Pharmacotherapy (Medications Ordered): No orders of the defined types were placed in this encounter.  Medications administered today: [name] had no medications administered during this visit.   Procedure Orders     Cervical Epidural Injection Lab Orders  No laboratory test(s) ordered today   Imaging Orders  No imaging studies ordered today   Referral Orders  No referral(s) requested today    Interventional management options: Planned, scheduled, and/or pending:   Diagnostic/therapeutic left-sided cervical epidural steroid injection under fluoroscopic guidance and IV sedation   Considering:   Diagnostic bilateral sacroiliac joint block Possible bilateral sacroiliac joint RFA Diagnostic bilateral intra-articular hip joint injection Diagnostic bilateral femoral nerve + obturator nerve block Possible bilateral femoral nerve + obturator nerve RFA Possible bilateral lumbar facet radiofrequency. (Left side done on 12/13/2016) Palliative bilateral lumbar facet block  Diagnostic right intra-articular shoulder joint injection Diagnostic right suprascapular nerve block Possible right suprascapular radiofrequencyablation.    Palliative PRN treatment(s):    Palliative bilateral lumbar facet block Diagnostic right intra-articular shoulder joint injection  Diagnostic right suprascapular nerve block Possible right suprascapular radiofrequencyablation.   Provider-requested follow-up: Return for Procedure (w/ sedation): (L) CESI #2.  Future Appointments  Date Time Provider [place]  10/13/2017  9:45 AM Milinda Pointer, MD [facility] None  11/01/2017 11:15 AM Vevelyn Francois, NP [facility] None  01/18/2018 10:20 AM Mikey College, NP [facility] None  03/27/2018 11:00 AM Mikey College, NP [facility] None   Primary Care Physician: [facility] Location: [facility] Out[facility] Management Facility Note by: Gaspar Cola, MD Date: 10/03/2017; Time: 2:35 PM

## 2017-10-03 NOTE — Patient Instructions (Addendum)
____________________________________________________________________________________________  Preparing for Procedure with Sedation Instructions: . Oral Intake: Do not eat or drink anything for at least 8 hours prior to your procedure. . Transportation: Public transportation is not allowed. Bring an adult driver. The driver must be physically present in our waiting room before any procedure can be started. [facility] Kitchen Physical Assistance: Bring an adult physically capable of assisting you, in the event you need help. This adult should keep you company at home for at least 6 hours after the procedure. . Blood Pressure Medicine: Take your blood pressure medicine with a sip of water the morning of the procedure. . Blood thinners:  . Diabetics on insulin: Notify the staff so that you can be scheduled 1st case in the morning. If your diabetes requires high dose insulin, take only  of your normal insulin dose the morning of the procedure and notify the staff that you have done so. . Preventing infections: Shower with an antibacterial soap the morning of your procedure. . Build-up your immune system: Take 1000 mg of Vitamin C with every meal (3 times a day) the day prior to your procedure. Marland Kitchen Antibiotics: Inform the staff if you have a condition or reason that requires you to take antibiotics before dental procedures. . Pregnancy: If you are pregnant, call and cancel the procedure. . Sickness: If you have a cold, fever, or any active infections, call and cancel the procedure. . Arrival: You must be in the facility at least 30 minutes prior to your scheduled procedure. . Children: Do not bring children with you. . Dress appropriately: Bring dark clothing that you would not mind if they get stained. . Valuables: Do not bring any jewelry or valuables. Procedure appointments are reserved for interventional treatments only. Marland Kitchen No Prescription Refills. . No medication changes will be discussed during procedure  appointments. . No disability issues will be discussed. ____________________________________________________________________________________________  ____________________________________________________________________________________________  Risk(s) and Possible Complications  Patient Responsibilities: It is important that you read this as it is part of your informed consent. It is our duty to inform you of the risks and possible complications associated with treatments offered to you. It is your responsibility as a patient to read this and to ask questions about anything that is not clear or that you believe was not covered in this document.  Patient's Rights: You have the right to refuse treatment. You also have the right to change your mind, even after initially having agreed to have the treatment done. However, under this last option, if you wait until the last second to change your mind, you may be charged for the materials used up to that point.  Introduction: Medicine is not an Chief Strategy Officer. Everything in Medicine, including the lack of treatment(s), carries the potential for danger, harm, or loss (which is by definition: Risk). In Medicine, a complication is a secondary problem, condition, or disease that can aggravate an already existing one. All treatments carry the risk of possible complications. The fact that a side effects or complications occurs, does not imply that the treatment was conducted incorrectly. It must be clearly understood that these can happen even when everything is done following the highest safety standards.  No treatment: You can choose not to proceed with the proposed treatment alternative. The "PRO(s)" would include: avoiding the risk of complications associated with the therapy. The "CON(s)" would include: not getting any of the treatment benefits. These benefits fall under one of three categories: diagnostic; therapeutic; and/or palliative. Diagnostic benefits  include: getting  information which can ultimately lead to improvement of the disease or symptom(s). Therapeutic benefits are those associated with the successful treatment of the disease. Finally, palliative benefits are those related to the decrease of the primary symptoms, without necessarily curing the condition (example: decreasing the pain from a flare-up of a chronic condition, such as incurable terminal cancer).  General Risks and Complications: These are associated to most interventional treatments. They can occur alone, or in combination. They fall under one of the following six (6) categories: no benefit or worsening of symptoms; bleeding; infection; nerve damage; allergic reactions; and/or death. 1. No benefits or worsening of symptoms: In Medicine there are no guarantees, only probabilities. No healthcare provider can ever guarantee that a medical treatment will work, they can only state the probability that it may. Furthermore, there is always the possibility that the condition may worsen, either directly, or indirectly, as a consequence of the treatment. 2. Bleeding: This is more common if the patient is taking a blood thinner, either prescription or over the counter (example: Goody Powders, Fish oil, Aspirin, Garlic, etc.), or if suffering a condition associated with impaired coagulation (example: Hemophilia, cirrhosis of the liver, low platelet counts, etc.). However, even if you do not have one on these, it can still happen. If you have any of these conditions, or take one of these drugs, make sure to notify your treating physician. 3. Infection: This is more common in patients with a compromised immune system, either due to disease (example: diabetes, cancer, human immunodeficiency virus [HIV], etc.), or due to medications or treatments (example: therapies used to treat cancer and rheumatological diseases). However, even if you do not have one on these, it can still happen. If you have any of  these conditions, or take one of these drugs, make sure to notify your treating physician. 4. Nerve Damage: This is more common when the treatment is an invasive one, but it can also happen with the use of medications, such as those used in the treatment of cancer. The damage can occur to small secondary nerves, or to large primary ones, such as those in the spinal cord and brain. This damage may be temporary or permanent and it may lead to impairments that can range from temporary numbness to permanent paralysis and/or brain death. 5. Allergic Reactions: Any time a substance or material comes in contact with our body, there is the possibility of an allergic reaction. These can range from a mild skin rash (contact dermatitis) to a severe systemic reaction (anaphylactic reaction), which can result in death. 6. Death: In general, any medical intervention can result in death, most of the time due to an unforeseen complication. ____________________________________________________________________________________________   Epidural Steroid Injection Patient Information  Description: The epidural space surrounds the nerves as they exit the spinal cord.  In some patients, the nerves can be compressed and inflamed by a bulging disc or a tight spinal canal (spinal stenosis).  By injecting steroids into the epidural space, we can bring irritated nerves into direct contact with a potentially helpful medication.  These steroids act directly on the irritated nerves and can reduce swelling and inflammation which often leads to decreased pain.  Epidural steroids may be injected anywhere along the spine and from the neck to the low back depending upon the location of your pain.   After numbing the skin with local anesthetic (like Novocaine), a small needle is passed into the epidural space slowly.  You may experience a sensation of pressure while  this is being done.  The entire block usually last less than 10  minutes.  Conditions which may be treated by epidural steroids:   Low back and leg pain  Neck and arm pain  Spinal stenosis  Post-laminectomy syndrome  Herpes zoster (shingles) pain  Pain from compression fractures  Preparation for the injection:  1. Do not eat any solid food or dairy products within 8 hours of your appointment.  2. You may drink clear liquids up to 3 hours before appointment.  Clear liquids include water, black coffee, juice or soda.  No milk or cream please. 3. You may take your regular medication, including pain medications, with a sip of water before your appointment  Diabetics should hold regular insulin (if taken separately) and take 1/2 normal NPH dos the morning of the procedure.  Carry some sugar containing items with you to your appointment. 4. A driver must accompany you and be prepared to drive you home after your procedure.  5. Bring all your current medications with your. 6. An IV may be inserted and sedation may be given at the discretion of the physician.   7. A blood pressure cuff, EKG and other monitors will often be applied during the procedure.  Some patients may need to have extra oxygen administered for a short period. 8. You will be asked to provide medical information, including your allergies, prior to the procedure.  We must know immediately if you are taking blood thinners (like Coumadin/Warfarin)  Or if you are allergic to IV iodine contrast (dye). We must know if you could possible be pregnant.  Possible side-effects:  Bleeding from needle site  Infection (rare, may require surgery)  Nerve injury (rare)  Numbness & tingling (temporary)  Difficulty urinating (rare, temporary)  Spinal headache ( a headache worse with upright posture)  Light -headedness (temporary)  Pain at injection site (several days)  Decreased blood pressure (temporary)  Weakness in arm/leg (temporary)  Pressure sensation in back/neck (temporary)  Call  if you experience:  Fever/chills associated with headache or increased back/neck pain.  Headache worsened by an upright position.  New onset weakness or numbness of an extremity below the injection site  Hives or difficulty breathing (go to the emergency room)  Inflammation or drainage at the infection site  Severe back/neck pain  Any new symptoms which are concerning to you  Please note:  Although the local anesthetic injected can often make your back or neck feel good for several hours after the injection, the pain will likely return.  It takes 3-7 days for steroids to work in the epidural space.  You may not notice any pain relief for at least that one week.  If effective, we will often do a series of three injections spaced 3-6 weeks apart to maximally decrease your pain.  After the initial series, we generally will wait several months before considering a repeat injection of the same type.  If you have any questions, please call [phone] [facility]

## 2017-10-13 ENCOUNTER — Ambulatory Visit (HOSPITAL_BASED_OUTPATIENT_CLINIC_OR_DEPARTMENT_OTHER): Payer: Medicare Other | Admitting: Pain Medicine

## 2017-10-13 ENCOUNTER — Encounter: Payer: Self-pay | Admitting: Pain Medicine

## 2017-10-13 ENCOUNTER — Other Ambulatory Visit: Payer: Self-pay

## 2017-10-13 ENCOUNTER — Ambulatory Visit
Admission: RE | Admit: 2017-10-13 | Discharge: 2017-10-13 | Disposition: A | Discharge status: Home / Self Care | Payer: Medicare Other | Source: Ambulatory Visit | Attending: Pain Medicine | Admitting: Pain Medicine

## 2017-10-13 VITALS — BP 132/82 | HR 60 | Temp 97.2°F | Resp 20 | Ht 68.0 in | Wt 158.0 lb

## 2017-10-13 DIAGNOSIS — Z79899 Other long term (current) drug therapy: Secondary | ICD-10-CM | POA: Diagnosis not present

## 2017-10-13 DIAGNOSIS — M5412 Radiculopathy, cervical region: Secondary | ICD-10-CM | POA: Insufficient documentation

## 2017-10-13 DIAGNOSIS — M4802 Spinal stenosis, cervical region: Secondary | ICD-10-CM

## 2017-10-13 DIAGNOSIS — G8929 Other chronic pain: Secondary | ICD-10-CM | POA: Insufficient documentation

## 2017-10-13 DIAGNOSIS — M5030 Other cervical disc degeneration, unspecified cervical region: Secondary | ICD-10-CM | POA: Insufficient documentation

## 2017-10-13 DIAGNOSIS — M9981 Other biomechanical lesions of cervical region: Secondary | ICD-10-CM | POA: Insufficient documentation

## 2017-10-13 DIAGNOSIS — Z9660 Presence of unspecified orthopedic joint implant: Secondary | ICD-10-CM | POA: Diagnosis not present

## 2017-10-13 DIAGNOSIS — Z888 Allergy status to other drugs, medicaments and biological substances status: Secondary | ICD-10-CM | POA: Diagnosis not present

## 2017-10-13 DIAGNOSIS — Z9049 Acquired absence of other specified parts of digestive tract: Secondary | ICD-10-CM | POA: Diagnosis not present

## 2017-10-13 DIAGNOSIS — Z9889 Other specified postprocedural states: Secondary | ICD-10-CM | POA: Insufficient documentation

## 2017-10-13 DIAGNOSIS — Z79891 Long term (current) use of opiate analgesic: Secondary | ICD-10-CM | POA: Diagnosis not present

## 2017-10-13 DIAGNOSIS — Z90710 Acquired absence of both cervix and uterus: Secondary | ICD-10-CM | POA: Insufficient documentation

## 2017-10-13 DIAGNOSIS — Z881 Allergy status to other antibiotic agents status: Secondary | ICD-10-CM | POA: Insufficient documentation

## 2017-10-13 DIAGNOSIS — Z950 Presence of cardiac pacemaker: Secondary | ICD-10-CM | POA: Diagnosis not present

## 2017-10-13 MED ORDER — LACTATED RINGERS IV SOLN
1000.0000 mL | Freq: Once | INTRAVENOUS | Status: AC
Start: 2017-10-13 — End: 2017-10-13
  Administered 2017-10-13: 1000 mL via INTRAVENOUS

## 2017-10-13 MED ORDER — ROPIVACAINE HCL 2 MG/ML IJ SOLN
1.0000 mL | Freq: Once | INTRAMUSCULAR | Status: AC
  Administered 2017-10-13: 1 mL via EPIDURAL
  Filled 2017-10-13: qty 10

## 2017-10-13 MED ORDER — MIDAZOLAM HCL 5 MG/5ML IJ SOLN
1.0000 mg | INTRAMUSCULAR | Status: DC | PRN
  Administered 2017-10-13: 2 mg via INTRAVENOUS
  Filled 2017-10-13: qty 5

## 2017-10-13 MED ORDER — DEXAMETHASONE SODIUM PHOSPHATE 10 MG/ML IJ SOLN
10.0000 mg | Freq: Once | INTRAMUSCULAR | Status: AC
  Administered 2017-10-13: 10 mg
  Filled 2017-10-13: qty 1

## 2017-10-13 MED ORDER — LIDOCAINE HCL 2 % IJ SOLN
10.0000 mL | Freq: Once | INTRAMUSCULAR | Status: AC
  Administered 2017-10-13: 200 mg
  Filled 2017-10-13: qty 40

## 2017-10-13 MED ORDER — FENTANYL CITRATE (PF) 100 MCG/2ML IJ SOLN
25.0000 ug | INTRAMUSCULAR | Status: DC | PRN
  Administered 2017-10-13: 50 ug via INTRAVENOUS
  Filled 2017-10-13: qty 2

## 2017-10-13 MED ORDER — IOPAMIDOL (ISOVUE-M 200) INJECTION 41%
10.0000 mL | Freq: Once | INTRAMUSCULAR | Status: AC
  Administered 2017-10-13: 1 mL via EPIDURAL
  Filled 2017-10-13: qty 10

## 2017-10-13 MED ORDER — SODIUM CHLORIDE 0.9% FLUSH
1.0000 mL | Freq: Once | INTRAVENOUS | Status: AC
  Administered 2017-10-13: 1 mL

## 2017-10-13 NOTE — Progress Notes (Signed)
Patient's Name: Kristin [name]  MRN: [id number]  Referring Provider: [facility]  DOB: [birth year]  PCP: [facility]  DOS: 10/13/2017  Note by: Gaspar Cola, MD  Service setting: Ambulatory outpatient  Specialty: Interventional Pain Management  Patient type: Established  Location: [facility] (AMB) Pain Management Facility  Visit type: Interventional Procedure   Primary Reason for Visit: Interventional Pain Management Treatment. CC: Neck Pain  Procedure:       Anesthesia, Analgesia, Anxiolysis:  Type: Diagnostic, Inter-Laminar, Epidural Steroid Injection #2  Region: Posterior Cervico-thoracic Region Level: C7-T1 Laterality: Left-Sided Paramedial  Type: Local Anesthesia with Moderate (Conscious) Sedation Local Anesthetic: Lidocaine 1% Route: Intravenous (IV) IV Access: Secured Sedation: Meaningful verbal contact was maintained at all times during the procedure  Indication(s): Analgesia and Anxiety   Indications: 1. Chronic cervical radicular pain (Tertiary Area of Pain) (Bilateral) (L>R)   2. DDD (degenerative disc disease), cervical   3. Cervical foraminal stenosis (Bilateral)   4. Chronic radicular cervical pain   5. Foraminal stenosis of cervical region    Pain Score: Pre-procedure: 4 /10 Post-procedure: 2 /10  Pre-op Assessment:  Ms. Bitton is a 84 y.o. (year old), female patient, seen today for interventional treatment. She  has a past surgical history that includes Abdominal hysterectomy; Back surgery; Rotator cuff repair; Breast surgery; Cataract extraction w/PHACO (10/19/2011); Cataract extraction w/PHACO (11/02/2011); Carpal tunnel release; Joint replacement; Colon surgery; Pacemaker insertion (N/A, 12/24/2015); Cholecystectomy; Breast lumpectomy (Right, 2004); and Insert / replace / remove pacemaker. Kristin [name] has a current medication list which includes the following prescription(s): albuterol, apixaban, vitamin d, furosemide, levocetirizine,  lisinopril, melatonin, oxycodone hcl, oxygen-helium, polyethylene glycol, ropinirole, bisacodyl, oxycodone hcl, and oxycodone hcl, and the following Facility-Administered Medications: fentanyl, lactated ringers, and midazolam. Her primarily concern today is the Neck Pain  Initial Vital Signs:  Pulse Rate: 78 Temp: 98.2 F (36.8 C) Resp: 16 BP: 134/76 SpO2: 91 %  BMI: Estimated body mass index is 24.02 kg/m as calculated from the following:   Height as of this encounter: 5\' 8"  (1.727 m).   Weight as of this encounter: 158 lb (71.7 kg).  Risk Assessment: Allergies: Reviewed. She is allergic to flexeril [cyclobenzaprine hcl] and vancomycin.  Allergy Precautions: None required Coagulopathies: Reviewed. None identified.  Blood-thinner therapy: None at this time Active Infection(s): Reviewed. None identified. Kristin [name] is afebrile  Site Confirmation: Kristin [name] was asked to confirm the procedure and laterality before marking the site Procedure checklist: Completed Consent: Before the procedure and under the influence of no sedative(s), amnesic(s), or anxiolytics, the patient was informed of the treatment options, risks and possible complications. To fulfill our ethical and legal obligations, as recommended by the American Medical Association's Code of Ethics, I have informed the patient of my clinical impression; the nature and purpose of the treatment or procedure; the risks, benefits, and possible complications of the intervention; the alternatives, including doing nothing; the risk(s) and benefit(s) of the alternative treatment(s) or procedure(s); and the risk(s) and benefit(s) of doing nothing. The patient was provided information about the general risks and possible complications associated with the procedure. These may include, but are not limited to: failure to achieve desired goals, infection, bleeding, organ or nerve damage, allergic reactions, paralysis, and death. In addition, the  patient was informed of those risks and complications associated to Spine-related procedures, such as failure to decrease pain; infection (i.e.: Meningitis, epidural or intraspinal abscess); bleeding (i.e.: epidural hematoma, subarachnoid hemorrhage, or any other type  of intraspinal or peri-dural bleeding); organ or nerve damage (i.e.: Any type of peripheral nerve, nerve root, or spinal cord injury) with subsequent damage to sensory, motor, and/or autonomic systems, resulting in permanent pain, numbness, and/or weakness of one or several areas of the body; allergic reactions; (i.e.: anaphylactic reaction); and/or death. Furthermore, the patient was informed of those risks and complications associated with the medications. These include, but are not limited to: allergic reactions (i.e.: anaphylactic or anaphylactoid reaction(s)); adrenal axis suppression; blood sugar elevation that in diabetics may result in ketoacidosis or comma; water retention that in patients with history of congestive heart failure may result in shortness of breath, pulmonary edema, and decompensation with resultant heart failure; weight gain; swelling or edema; medication-induced neural toxicity; particulate matter embolism and blood vessel occlusion with resultant organ, and/or nervous system infarction; and/or aseptic necrosis of one or more joints. Finally, the patient was informed that Medicine is not an exact science; therefore, there is also the possibility of unforeseen or unpredictable risks and/or possible complications that may result in a catastrophic outcome. The patient indicated having understood very clearly. We have given the patient no guarantees and we have made no promises. Enough time was given to the patient to ask questions, all of which were answered to the patient's satisfaction. Kristin [name] has indicated that she wanted to continue with the procedure. Attestation: I, the ordering provider, attest that I have discussed  with the patient the benefits, risks, side-effects, alternatives, likelihood of achieving goals, and potential problems during recovery for the procedure that I have provided informed consent. Date  Time: 10/13/2017  9:44 AM  Pre-Procedure Preparation:  Monitoring: As per clinic protocol. Respiration, ETCO2, SpO2, BP, heart rate and rhythm monitor placed and checked for adequate function Safety Precautions: Patient was assessed for positional comfort and pressure points before starting the procedure. Time-out: I initiated and conducted the "Time-out" before starting the procedure, as per protocol. The patient was asked to participate by confirming the accuracy of the "Time Out" information. Verification of the correct person, site, and procedure were performed and confirmed by me, the nursing staff, and the patient. "Time-out" conducted as per Joint Commission's Universal Protocol (UP.01.01.01). Time: 1017  Description of Procedure Process:   Position: Prone with head of the table was raised to facilitate breathing. Target Area: For Epidural Steroid injections the target is the interlaminar space, initially targeting the lower border of the superior vertebral body lamina. Approach: Paramedial approach. Area Prepped: Entire PosteriorCervical Region Prepping solution: ChloraPrep (2% chlorhexidine gluconate and 70% isopropyl alcohol) Safety Precautions: Aspiration looking for blood return was conducted prior to all injections. At no point did we inject any substances, as a needle was being advanced. No attempts were made at seeking any paresthesias. Safe injection practices and needle disposal techniques used. Medications properly checked for expiration dates. SDV (single dose vial) medications used. Description of the Procedure: Protocol guidelines were followed. The procedure needle was introduced through the skin, ipsilateral to the reported pain, and advanced to the target area. Bone was contacted  and the needle walked caudad, until the lamina was cleared. The epidural space was identified using "loss-of-resistance technique" with 2-3 ml of PF-NaCl (0.9% NSS), in a 5cc LOR glass syringe. Vitals:   10/13/17 1027 10/13/17 1033 10/13/17 1043 10/13/17 1053  BP: 116/72 130/67 121/71 132/82  Pulse: 61 62 61 60  Resp: 16 16 14 20   Temp: (!) 96.4 F (35.8 C)   (!) 97.2 F (36.2 C)  SpO2: 98%  98% 92% 98%  Weight:      Height:        Start Time: 1017 hrs. End Time:   hrs. Materials:  Needle(s) Type: Epidural needle Gauge: 17G Length: 3.5-in Medication(s): Please see orders for medications and dosing details.  Imaging Guidance (Spinal):  Type of Imaging Technique: Fluoroscopy Guidance (Spinal) Indication(s): Assistance in needle guidance and placement for procedures requiring needle placement in or near specific anatomical locations not easily accessible without such assistance. Exposure Time: Please see nurses notes. Contrast: Before injecting any contrast, we confirmed that the patient did not have an allergy to iodine, shellfish, or radiological contrast. Once satisfactory needle placement was completed at the desired level, radiological contrast was injected. Contrast injected under live fluoroscopy. No contrast complications. See chart for type and volume of contrast used. Fluoroscopic Guidance: I was personally present during the use of fluoroscopy. "Tunnel Vision Technique" used to obtain the best possible view of the target area. Parallax error corrected before commencing the procedure. "Direction-depth-direction" technique used to introduce the needle under continuous pulsed fluoroscopy. Once target was reached, antero-posterior, oblique, and lateral fluoroscopic projection used confirm needle placement in all planes. Images permanently stored in EMR. Interpretation: I personally interpreted the imaging intraoperatively. Adequate needle placement confirmed in multiple planes.  Appropriate spread of contrast into desired area was observed. No evidence of afferent or efferent intravascular uptake. No intrathecal or subarachnoid spread observed. Permanent images saved into the patient's record.  Antibiotic Prophylaxis:   Anti-infectives (From admission, onward)   None     Indication(s): None identified  Post-operative Assessment:  Post-procedure Vital Signs:  Pulse Rate: 60 Temp: (!) 97.2 F (36.2 C) Resp: 20 BP: 132/82 SpO2: 98 %  EBL: None  Complications: No immediate post-treatment complications observed by team, or reported by patient.  Note: The patient tolerated the entire procedure well. A repeat set of vitals were taken after the procedure and the patient was kept under observation following institutional policy, for this type of procedure. Post-procedural neurological assessment was performed, showing return to baseline, prior to discharge. The patient was provided with post-procedure discharge instructions, including a section on how to identify potential problems. Should any problems arise concerning this procedure, the patient was given instructions to immediately contact us, at any time, without hesitation. In any case, we plan to contact the patient by telephone for a follow-up status report regarding this interventional procedure.  Comments:  No additional relevant information.  Plan of Care    Imaging Orders     DG C-Arm 1-60 Min-No Report  Procedure Orders     Cervical Epidural Injection  Medications ordered for procedure: Meds ordered this encounter  Medications  . midazolam (VERSED) 5 MG/5ML injection 1-2 mg    Make sure Flumazenil is available in the pyxis when using this medication. If oversedation occurs, administer 0.2 mg IV over 15 sec. If after 45 sec no response, administer 0.2 mg again over 1 min; may repeat at 1 min intervals; not to exceed 4 doses (1 mg)  . fentaNYL (SUBLIMAZE) injection 25-50 mcg    Make sure Narcan is  available in the pyxis when using this medication. In the event of respiratory depression (RR< 8/min): Titrate NARCAN (naloxone) in increments of 0.1 to 0.2 mg IV at 2-3 minute intervals, until desired degree of reversal.  . lactated ringers infusion 1,000 mL  . iopamidol (ISOVUE-M) 41 % intrathecal injection 10 mL    Must be Myelogram-compatible. If not available, you may substitute with  a water-soluble, non-ionic, hypoallergenic, myelogram-compatible radiological contrast medium.  Marland Kitchen lidocaine (XYLOCAINE) 2 % (with pres) injection 200 mg  . sodium chloride flush (NS) 0.9 % injection 1 mL  . ropivacaine (PF) 2 mg/mL (0.2%) (NAROPIN) injection 1 mL  . dexamethasone (DECADRON) injection 10 mg   Medications administered: We administered midazolam, fentaNYL, lactated ringers, iopamidol, lidocaine, sodium chloride flush, ropivacaine (PF) 2 mg/mL (0.2%), and dexamethasone.  See the medical record for exact dosing, route, and time of administration.  New Prescriptions   No medications on file   Disposition: Discharge home  Discharge Date & Time: 10/13/2017; 1057 hrs.   Physician-requested Follow-up: Return for post-procedure eval (2 wks), w/ Dr. Dossie Arbour.  Future Appointments  Date Time Provider [place]  11/01/2017 11:15 AM Vevelyn Francois, NP [facility] None  01/18/2018 10:20 AM Mikey College, NP [facility] None  03/27/2018 11:00 AM Mikey College, NP [facility] None   Primary Care Physician: [facility] Location: [facility] Outpatient [facility] Management Facility Note by: Gaspar Cola, MD Date: 10/13/2017; Time: 11:13 AM  Disclaimer:  Medicine is not an Chief Strategy Officer. The only guarantee in medicine is that nothing is guaranteed. It is important to note that the decision to proceed with this intervention was based on the information collected from the patient. The Data and conclusions were drawn from the patient's questionnaire, the interview, and  the physical examination. Because the information was provided in large part by the patient, it cannot be guaranteed that it has not been purposely or unconsciously manipulated. Every effort has been made to obtain as much relevant data as possible for this evaluation. It is important to note that the conclusions that lead to this procedure are derived in large part from the available data. Always take into account that the treatment will also be dependent on availability of resources and existing treatment guidelines, considered by other Pain Management Practitioners as being common knowledge and practice, at the time of the intervention. For Medico-Legal purposes, it is also important to point out that variation in procedural techniques and pharmacological choices are the acceptable norm. The indications, contraindications, technique, and results of the above procedure should only be interpreted and judged by a Board-Certified Interventional Pain Specialist with extensive familiarity and expertise in the same exact procedure and technique.

## 2017-10-13 NOTE — Progress Notes (Signed)
Safety precautions to be maintained throughout the outpatient stay will include: orient to surroundings, keep bed in low position, maintain call bell within reach at all times, provide assistance with transfer out of bed and ambulation.  

## 2017-10-13 NOTE — Patient Instructions (Addendum)
__________GENERAL RISKS AND COMPLICATIONS  What are the risk, side effects and possible complications? Generally speaking, most procedures are safe.  However, with any procedure there are risks, side effects, and the possibility of complications.  The risks and complications are dependent upon the sites that are lesioned, or the type of nerve block to be performed.  The closer the procedure is to the spine, the more serious the risks are.  Great care is taken when placing the radio frequency needles, block needles or lesioning probes, but sometimes complications can occur. 1. Infection: Any time there is an injection through the skin, there is a risk of infection.  This is why sterile conditions are used for these blocks.  There are four possible types of infection. 1. Localized skin infection. 2. Central Nervous System Infection-This can be in the form of Meningitis, which can be deadly. 3. Epidural Infections-This can be in the form of an epidural abscess, which can cause pressure inside of the spine, causing compression of the spinal cord with subsequent paralysis. This would require an emergency surgery to decompress, and there are no guarantees that the patient would recover from the paralysis. 4. Discitis-This is an infection of the intervertebral discs.  It occurs in about 1% of discography procedures.  It is difficult to treat and it may lead to surgery.        2. Pain: the needles have to go through skin and soft tissues, will cause soreness.       3. Damage to internal structures:  The nerves to be lesioned may be near blood vessels or    other nerves which can be potentially damaged.       4. Bleeding: Bleeding is more common if the patient is taking blood thinners such as  aspirin, Coumadin, Ticiid, Plavix, etc., or if he/she have some genetic predisposition  such as hemophilia. Bleeding into the spinal canal can cause compression of the spinal  cord with subsequent paralysis.  This would  require an emergency surgery to  decompress and there are no guarantees that the patient would recover from the  paralysis.       5. Pneumothorax:  Puncturing of a lung is a possibility, every time a needle is introduced in  the area of the chest or upper back.  Pneumothorax refers to free air around the  collapsed lung(s), inside of the thoracic cavity (chest cavity).  Another two possible  complications related to a similar event would include: Hemothorax and Chylothorax.   These are variations of the Pneumothorax, where instead of air around the collapsed  lung(s), you may have blood or chyle, respectively.       6. Spinal headaches: They may occur with any procedures in the area of the spine.       7. Persistent CSF (Cerebro-Spinal Fluid) leakage: This is a rare problem, but may occur  with prolonged intrathecal or epidural catheters either due to the formation of a fistulous  track or a dural tear.       8. Nerve damage: By working so close to the spinal cord, there is always a possibility of  nerve damage, which could be as serious as a permanent spinal cord injury with  paralysis.       9. Death:  Although rare, severe deadly allergic reactions known as "Anaphylactic  reaction" can occur to any of the medications used.      10. Worsening of the symptoms:  We can always make thing worse.  What are the chances of something like this happening? Chances of any of this occuring are extremely low.  By statistics, you have more of a chance of getting killed in a motor vehicle accident: while driving to the hospital than any of the above occurring .  Nevertheless, you should be aware that they are possibilities.  In general, it is similar to taking a shower.  Everybody knows that you can slip, hit your head and get killed.  Does that mean that you should not shower again?  Nevertheless always keep in mind that statistics do not mean anything if you happen to be on the wrong side of them.  Even if a procedure  has a 1 (one) in a 1,000,000 (million) chance of going wrong, it you happen to be that one..Also, keep in mind that by statistics, you have more of a chance of having something go wrong when taking medications.  Who should not have this procedure? If you are on a blood thinning medication (e.g. Coumadin, Plavix, see list of "Blood Thinners"), or if you have an active infection going on, you should not have the procedure.  If you are taking any blood thinners, please inform your physician.  How should I prepare for this procedure?  Do not eat or drink anything at least six hours prior to the procedure.  Bring a driver with you .  It cannot be a taxi.  Come accompanied by an adult that can drive you back, and that is strong enough to help you if your legs get weak or numb from the local anesthetic.  Take all of your medicines the morning of the procedure with just enough water to swallow them.  If you have diabetes, make sure that you are scheduled to have your procedure done first thing in the morning, whenever possible.  If you have diabetes, take only half of your insulin dose and notify our nurse that you have done so as soon as you arrive at the clinic.  If you are diabetic, but only take blood sugar pills (oral hypoglycemic), then do not take them on the morning of your procedure.  You may take them after you have had the procedure.  Do not take aspirin or any aspirin-containing medications, at least eleven (11) days prior to the procedure.  They may prolong bleeding.  Wear loose fitting clothing that may be easy to take off and that you would not mind if it got stained with Betadine or blood.  Do not wear any jewelry or perfume  Remove any nail coloring.  It will interfere with some of our monitoring equipment.  NOTE: Remember that this is not meant to be interpreted as a complete list of all possible complications.  Unforeseen problems may occur.  BLOOD THINNERS The following  drugs contain aspirin or other products, which can cause increased bleeding during surgery and should not be taken for 2 weeks prior to and 1 week after surgery.  If you should need take something for relief of minor pain, you may take acetaminophen which is found in Tylenol,m Datril, Anacin-3 and Panadol. It is not blood thinner. The products listed below are.  Do not take any of the products listed below in addition to any listed on your instruction sheet.  A.P.C or A.P.C with Codeine Codeine Phosphate Capsules #3 Ibuprofen Ridaura  ABC compound Congesprin Imuran rimadil  Advil Cope Indocin Robaxisal  Alka-Seltzer Effervescent Pain Reliever and Antacid Coricidin or Coricidin-D  Indomethacin Rufen  Alka-Seltzer plus Cold Medicine Cosprin Ketoprofen S-A-C Tablets  Anacin Analgesic Tablets or Capsules Coumadin Korlgesic Salflex  Anacin Extra Strength Analgesic tablets or capsules CP-2 Tablets Lanoril Salicylate  Anaprox Cuprimine Capsules Levenox Salocol  Anexsia-D Dalteparin Magan Salsalate  Anodynos Darvon compound Magnesium Salicylate Sine-off  Ansaid Dasin Capsules Magsal Sodium Salicylate  Anturane Depen Capsules Marnal Soma  APF Arthritis pain formula Dewitt's Pills Measurin Stanback  Argesic Dia-Gesic Meclofenamic Sulfinpyrazone  Arthritis Bayer Timed Release Aspirin Diclofenac Meclomen Sulindac  Arthritis pain formula Anacin Dicumarol Medipren Supac  Analgesic (Safety coated) Arthralgen Diffunasal Mefanamic Suprofen  Arthritis Strength Bufferin Dihydrocodeine Mepro Compound Suprol  Arthropan liquid Dopirydamole Methcarbomol with Aspirin Synalgos  ASA tablets/Enseals Disalcid Micrainin Tagament  Ascriptin Doan's Midol Talwin  Ascriptin A/D Dolene Mobidin Tanderil  Ascriptin Extra Strength Dolobid Moblgesic Ticlid  Ascriptin with Codeine Doloprin or Doloprin with Codeine Momentum Tolectin  Asperbuf Duoprin Mono-gesic Trendar  Aspergum Duradyne Motrin or Motrin IB Triminicin  Aspirin  plain, buffered or enteric coated Durasal Myochrisine Trigesic  Aspirin Suppositories Easprin Nalfon Trillsate  Aspirin with Codeine Ecotrin Regular or Extra Strength Naprosyn Uracel  Atromid-S Efficin Naproxen Ursinus  Auranofin Capsules Elmiron Neocylate Vanquish  Axotal Emagrin Norgesic Verin  Azathioprine Empirin or Empirin with Codeine Normiflo Vitamin E  Azolid Emprazil Nuprin Voltaren  Bayer Aspirin plain, buffered or children's or timed BC Tablets or powders Encaprin Orgaran Warfarin Sodium  Buff-a-Comp Enoxaparin Orudis Zorpin  Buff-a-Comp with Codeine Equegesic Os-Cal-Gesic   Buffaprin Excedrin plain, buffered or Extra Strength Oxalid   Bufferin Arthritis Strength Feldene Oxphenbutazone   Bufferin plain or Extra Strength Feldene Capsules Oxycodone with Aspirin   Bufferin with Codeine Fenoprofen Fenoprofen Pabalate or Pabalate-SF   Buffets II Flogesic Panagesic   Buffinol plain or Extra Strength Florinal or Florinal with Codeine Panwarfarin   Buf-Tabs Flurbiprofen Penicillamine   Butalbital Compound Four-way cold tablets Penicillin   Butazolidin Fragmin Pepto-Bismol   Carbenicillin Geminisyn Percodan   Carna Arthritis Reliever Geopen Persantine   Carprofen Gold's salt Persistin   Chloramphenicol Goody's Phenylbutazone   Chloromycetin Haltrain Piroxlcam   Clmetidine heparin Plaquenil   Cllnoril Hyco-pap Ponstel   Clofibrate Hydroxy chloroquine Propoxyphen         Before stopping any of these medications, be sure to consult the physician who ordered them.  Some, such as Coumadin (Warfarin) are ordered to prevent or treat serious conditions such as "deep thrombosis", "pumonary embolisms", and other heart problems.  The amount of time that you may need off of the medication may also vary with the medication and the reason for which you were taking it.  If you are taking any of these medications, please make sure you notify your pain physician before you undergo any  procedures.            ____________________________________________________________________________________________  Post-Procedure instructions Instructions:  Apply ice: Fill a plastic sandwich bag with crushed ice. Cover it with a small towel and apply to injection site. Apply for 15 minutes then remove x 15 minutes. Repeat sequence on day of procedure, until you go to bed. The purpose is to minimize swelling and discomfort after procedure.  Apply heat: Apply heat to procedure site starting the day following the procedure. The purpose is to treat any soreness and discomfort from the procedure.  Food intake: Start with clear liquids (like water) and advance to regular food, as tolerated.   Physical activities: Keep activities to a minimum for the first 8 hours after the procedure.   Driving: If you  have received any sedation, you are not allowed to drive for 24 hours after your procedure.  Blood thinner: Restart your blood thinner 6 hours after your procedure. (Only for those taking blood thinners)  Insulin: As soon as you can eat, you may resume your normal dosing schedule. (Only for those taking insulin)  Infection prevention: Keep procedure site clean and dry.  Post-procedure Pain Diary: Extremely important that this be done correctly and accurately. Recorded information will be used to determine the next step in treatment.  Pain evaluated is that of treated area only. Do not include pain from an untreated area.  Complete every hour, on the hour, for the initial 8 hours. Set an alarm to help you do this part accurately.  Do not go to sleep and have it completed later. It will not be accurate.  Follow-up appointment: Keep your follow-up appointment after the procedure. Usually 2 weeks for most procedures. (6 weeks in the case of radiofrequency.) Bring you pain diary.  Expect:  From numbing medicine (AKA: Local Anesthetics): Numbness or decrease in pain.  Onset: Full  effect within 15 minutes of injected.  Duration: It will depend on the type of local anesthetic used. On the average, 1 to 8 hours.   From steroids: Decrease in swelling or inflammation. Once inflammation is improved, relief of the pain will follow.  Onset of benefits: Depends on the amount of swelling present. The more swelling, the longer it will take for the benefits to be seen. In some cases, up to 10 days.  Duration: Steroids will stay in the system x 2 weeks. Duration of benefits will depend on multiple posibilities including persistent irritating factors.  From procedure: Some discomfort is to be expected once the numbing medicine wears off. This should be minimal if ice and heat are applied as instructed. Call if:  You experience numbness and weakness that gets worse with time, as opposed to wearing off.  New onset bowel or bladder incontinence. (Spinal procedures only)  Emergency Numbers:  [place] business hours (Monday - Thursday, 8:00 AM - 4:00 PM) (Friday, 9:00 AM - 12:00 Noon): [phone]  After hours: [phone] ____________________________________________________________________________________________   ____________________________________________________________________________________________  Post-Procedure instructions Instructions:  Apply ice: Fill a plastic sandwich bag with crushed ice. Cover it with a small towel and apply to injection site. Apply for 15 minutes then remove x 15 minutes. Repeat sequence on day of procedure, until you go to bed. The purpose is to minimize swelling and discomfort after procedure.  Apply heat: Apply heat to procedure site starting the day following the procedure. The purpose is to treat any soreness and discomfort from the procedure.  Food intake: Start with clear liquids (like water) and advance to regular food, as tolerated.   Physical activities: Keep activities to a minimum for the first 8 hours after the  procedure.   Driving: If you have received any sedation, you are not allowed to drive for 24 hours after your procedure.  Blood thinner: Restart your blood thinner 6 hours after your procedure. (Only for those taking blood thinners)  Insulin: As soon as you can eat, you may resume your normal dosing schedule. (Only for those taking insulin)  Infection prevention: Keep procedure site clean and dry.  Post-procedure Pain Diary: Extremely important that this be done correctly and accurately. Recorded information will be used to determine the next step in treatment.  Pain evaluated is that of treated area only. Do not include pain from an untreated area.  Complete every hour, on the hour, for the initial 8 hours. Set an alarm to help you do this part accurately.  Do not go to sleep and have it completed later. It will not be accurate.  Follow-up appointment: Keep your follow-up appointment after the procedure. Usually 2 weeks for most procedures. (6 weeks in the case of radiofrequency.) Bring you pain diary.  Expect:  From numbing medicine (AKA: Local Anesthetics): Numbness or decrease in pain.  Onset: Full effect within 15 minutes of injected.  Duration: It will depend on the type of local anesthetic used. On the average, 1 to 8 hours.   From steroids: Decrease in swelling or inflammation. Once inflammation is improved, relief of the pain will follow.  Onset of benefits: Depends on the amount of swelling present. The more swelling, the longer it will take for the benefits to be seen. In some cases, up to 10 days.  Duration: Steroids will stay in the system x 2 weeks. Duration of benefits will depend on multiple posibilities including persistent irritating factors.  From procedure: Some discomfort is to be expected once the numbing medicine wears off. This should be minimal if ice and heat are applied as instructed. Call if:  You experience numbness and weakness that gets worse with  time, as opposed to wearing off.  New onset bowel or bladder incontinence. (Spinal procedures only)  Emergency Numbers:  [place] business hours (Monday - Thursday, 8:00 AM - 4:00 PM) (Friday, 9:00 AM - 12:00 Noon): [phone]  After hours: [phone] ____________________________________________________________________________________________

## 2017-10-14 ENCOUNTER — Telehealth: Payer: Self-pay

## 2017-10-14 NOTE — Telephone Encounter (Signed)
Post procedure phone call. Patient states she is doing good.  

## 2017-10-25 ENCOUNTER — Ambulatory Visit: Payer: Medicare Other | Admitting: Family

## 2017-11-01 ENCOUNTER — Ambulatory Visit: Payer: Medicare Other | Attending: Nurse Practitioner | Admitting: Nurse Practitioner

## 2017-11-01 ENCOUNTER — Encounter: Payer: Self-pay | Admitting: Nurse Practitioner

## 2017-11-01 ENCOUNTER — Other Ambulatory Visit: Payer: Self-pay

## 2017-11-01 VITALS — BP 154/88 | HR 75 | Temp 97.7°F | Resp 16 | Ht 68.0 in | Wt 158.0 lb

## 2017-11-01 DIAGNOSIS — M5137 Other intervertebral disc degeneration, lumbosacral region: Secondary | ICD-10-CM | POA: Diagnosis not present

## 2017-11-01 DIAGNOSIS — K219 Gastro-esophageal reflux disease without esophagitis: Secondary | ICD-10-CM | POA: Diagnosis not present

## 2017-11-01 DIAGNOSIS — M159 Polyosteoarthritis, unspecified: Secondary | ICD-10-CM

## 2017-11-01 DIAGNOSIS — I5032 Chronic diastolic (congestive) heart failure: Secondary | ICD-10-CM | POA: Insufficient documentation

## 2017-11-01 DIAGNOSIS — M4802 Spinal stenosis, cervical region: Secondary | ICD-10-CM | POA: Diagnosis not present

## 2017-11-01 DIAGNOSIS — I4891 Unspecified atrial fibrillation: Secondary | ICD-10-CM | POA: Insufficient documentation

## 2017-11-01 DIAGNOSIS — M25511 Pain in right shoulder: Secondary | ICD-10-CM

## 2017-11-01 DIAGNOSIS — M479 Spondylosis, unspecified: Secondary | ICD-10-CM | POA: Insufficient documentation

## 2017-11-01 DIAGNOSIS — Z7901 Long term (current) use of anticoagulants: Secondary | ICD-10-CM | POA: Insufficient documentation

## 2017-11-01 DIAGNOSIS — M25552 Pain in left hip: Secondary | ICD-10-CM | POA: Insufficient documentation

## 2017-11-01 DIAGNOSIS — J449 Chronic obstructive pulmonary disease, unspecified: Secondary | ICD-10-CM | POA: Insufficient documentation

## 2017-11-01 DIAGNOSIS — M7918 Myalgia, other site: Secondary | ICD-10-CM | POA: Insufficient documentation

## 2017-11-01 DIAGNOSIS — E785 Hyperlipidemia, unspecified: Secondary | ICD-10-CM | POA: Insufficient documentation

## 2017-11-01 DIAGNOSIS — M109 Gout, unspecified: Secondary | ICD-10-CM | POA: Diagnosis not present

## 2017-11-01 DIAGNOSIS — N390 Urinary tract infection, site not specified: Secondary | ICD-10-CM | POA: Insufficient documentation

## 2017-11-01 DIAGNOSIS — G894 Chronic pain syndrome: Secondary | ICD-10-CM | POA: Insufficient documentation

## 2017-11-01 DIAGNOSIS — Z8614 Personal history of Methicillin resistant Staphylococcus aureus infection: Secondary | ICD-10-CM | POA: Insufficient documentation

## 2017-11-01 DIAGNOSIS — Z9889 Other specified postprocedural states: Secondary | ICD-10-CM | POA: Insufficient documentation

## 2017-11-01 DIAGNOSIS — E860 Dehydration: Secondary | ICD-10-CM | POA: Insufficient documentation

## 2017-11-01 DIAGNOSIS — Z950 Presence of cardiac pacemaker: Secondary | ICD-10-CM | POA: Diagnosis not present

## 2017-11-01 DIAGNOSIS — T402X5A Adverse effect of other opioids, initial encounter: Secondary | ICD-10-CM | POA: Diagnosis not present

## 2017-11-01 DIAGNOSIS — Z8249 Family history of ischemic heart disease and other diseases of the circulatory system: Secondary | ICD-10-CM | POA: Insufficient documentation

## 2017-11-01 DIAGNOSIS — M25551 Pain in right hip: Secondary | ICD-10-CM | POA: Insufficient documentation

## 2017-11-01 DIAGNOSIS — M150 Primary generalized (osteo)arthritis: Secondary | ICD-10-CM | POA: Diagnosis not present

## 2017-11-01 DIAGNOSIS — I341 Nonrheumatic mitral (valve) prolapse: Secondary | ICD-10-CM | POA: Diagnosis not present

## 2017-11-01 DIAGNOSIS — Z79899 Other long term (current) drug therapy: Secondary | ICD-10-CM | POA: Insufficient documentation

## 2017-11-01 DIAGNOSIS — R001 Bradycardia, unspecified: Secondary | ICD-10-CM | POA: Insufficient documentation

## 2017-11-01 DIAGNOSIS — K5900 Constipation, unspecified: Secondary | ICD-10-CM | POA: Insufficient documentation

## 2017-11-01 DIAGNOSIS — M542 Cervicalgia: Secondary | ICD-10-CM

## 2017-11-01 DIAGNOSIS — M48061 Spinal stenosis, lumbar region without neurogenic claudication: Secondary | ICD-10-CM | POA: Insufficient documentation

## 2017-11-01 DIAGNOSIS — Z9049 Acquired absence of other specified parts of digestive tract: Secondary | ICD-10-CM | POA: Insufficient documentation

## 2017-11-01 DIAGNOSIS — Z853 Personal history of malignant neoplasm of breast: Secondary | ICD-10-CM | POA: Diagnosis not present

## 2017-11-01 DIAGNOSIS — M8949 Other hypertrophic osteoarthropathy, multiple sites: Secondary | ICD-10-CM

## 2017-11-01 DIAGNOSIS — G2581 Restless legs syndrome: Secondary | ICD-10-CM

## 2017-11-01 DIAGNOSIS — Z87891 Personal history of nicotine dependence: Secondary | ICD-10-CM | POA: Insufficient documentation

## 2017-11-01 DIAGNOSIS — Z881 Allergy status to other antibiotic agents status: Secondary | ICD-10-CM | POA: Insufficient documentation

## 2017-11-01 DIAGNOSIS — Z803 Family history of malignant neoplasm of breast: Secondary | ICD-10-CM | POA: Insufficient documentation

## 2017-11-01 DIAGNOSIS — K5903 Drug induced constipation: Secondary | ICD-10-CM

## 2017-11-01 DIAGNOSIS — B1920 Unspecified viral hepatitis C without hepatic coma: Secondary | ICD-10-CM | POA: Insufficient documentation

## 2017-11-01 DIAGNOSIS — M5412 Radiculopathy, cervical region: Secondary | ICD-10-CM | POA: Diagnosis not present

## 2017-11-01 DIAGNOSIS — N3281 Overactive bladder: Secondary | ICD-10-CM | POA: Insufficient documentation

## 2017-11-01 DIAGNOSIS — Z841 Family history of disorders of kidney and ureter: Secondary | ICD-10-CM | POA: Insufficient documentation

## 2017-11-01 DIAGNOSIS — N183 Chronic kidney disease, stage 3 (moderate): Secondary | ICD-10-CM | POA: Diagnosis not present

## 2017-11-01 DIAGNOSIS — Z823 Family history of stroke: Secondary | ICD-10-CM | POA: Insufficient documentation

## 2017-11-01 DIAGNOSIS — Z9660 Presence of unspecified orthopedic joint implant: Secondary | ICD-10-CM | POA: Insufficient documentation

## 2017-11-01 DIAGNOSIS — Z90710 Acquired absence of both cervix and uterus: Secondary | ICD-10-CM | POA: Insufficient documentation

## 2017-11-01 DIAGNOSIS — G8929 Other chronic pain: Secondary | ICD-10-CM | POA: Diagnosis not present

## 2017-11-01 DIAGNOSIS — G893 Neoplasm related pain (acute) (chronic): Secondary | ICD-10-CM | POA: Diagnosis not present

## 2017-11-01 MED ORDER — ROPINIROLE HCL 2 MG PO TABS: ORAL_TABLET | ORAL | 2 refills | Status: DC

## 2017-11-01 MED ORDER — OXYCODONE HCL 20 MG PO TABS: 20.0000 mg | ORAL_TABLET | Freq: Three times a day (TID) | ORAL | 0 refills | Status: DC | PRN

## 2017-11-01 MED ORDER — POLYETHYLENE GLYCOL 3350 17 G PO PACK: 17.0000 g | PACK | Freq: Every day | ORAL | 2 refills | Status: DC | PRN

## 2017-11-01 MED ORDER — MELATONIN 10 MG PO CAPS: 20.0000 mg | ORAL_CAPSULE | Freq: Every evening | ORAL | 2 refills | Status: DC | PRN

## 2017-11-01 MED ORDER — BISACODYL 5 MG PO TBEC: 10.0000 mg | DELAYED_RELEASE_TABLET | Freq: Every day | ORAL | 2 refills | Status: DC | PRN

## 2017-11-01 NOTE — Progress Notes (Signed)
Nursing Pain Medication Assessment:  Safety precautions to be maintained throughout the outpatient stay will include: orient to surroundings, keep bed in low position, maintain call bell within reach at all times, provide assistance with transfer out of bed and ambulation.  Medication Inspection Compliance: Pill count conducted under aseptic conditions, in front of the patient. Neither the pills nor the bottle was removed from the patient's sight at any time. Once count was completed pills were immediately returned to the patient in their original bottle.  Medication: See above Pill/Patch Count: 8 of 90 pills remain Pill/Patch Appearance: Markings consistent with prescribed medication Bottle Appearance: Standard pharmacy container. Clearly labeled. Filled Date: 2 / 86 / 2019 Last Medication intake:  Today

## 2017-11-01 NOTE — Patient Instructions (Addendum)
____________________________________________________________________________________________  Medication Rules  Applies to: All patients receiving prescriptions (written or electronic).  Pharmacy of record: Pharmacy where electronic prescriptions will be sent. If written prescriptions are taken to a different pharmacy, please inform the nursing staff. The pharmacy listed in the electronic medical record should be the one where you would like electronic prescriptions to be sent.  Prescription refills: Only during scheduled appointments. Applies to both, written and electronic prescriptions.  NOTE: The following applies primarily to controlled substances (Opioid* Pain Medications).   Patient's responsibilities: 1. Pain Pills: Bring all pain pills to every appointment (except for procedure appointments). 2. Pill Bottles: Bring pills in original pharmacy bottle. Always bring newest bottle. Bring bottle, even if empty. 3. Medication refills: You are responsible for knowing and keeping track of what medications you need refilled. The day before your appointment, write a list of all prescriptions that need to be refilled. Bring that list to your appointment and give it to the admitting nurse. Prescriptions will be written only during appointments. If you forget a medication, it will not be "Called in", "Faxed", or "electronically sent". You will need to get another appointment to get these prescribed. 4. Prescription Accuracy: You are responsible for carefully inspecting your prescriptions before leaving our office. Have the discharge nurse carefully go over each prescription with you, before taking them home. Make sure that your name is accurately spelled, that your address is correct. Check the name and dose of your medication to make sure it is accurate. Check the number of pills, and the written instructions to make sure they are clear and accurate. Make sure that you are given enough medication to last  until your next medication refill appointment. 5. Taking Medication: Take medication as prescribed. Never take more pills than instructed. Never take medication more frequently than prescribed. Taking less pills or less frequently is permitted and encouraged, when it comes to controlled substances (written prescriptions).  6. Inform other Doctors: Always inform, all of your healthcare providers, of all the medications you take. 7. Pain Medication from other Providers: You are not allowed to accept any additional pain medication from any other Doctor or Healthcare provider. There are two exceptions to this rule. (see below) In the event that you require additional pain medication, you are responsible for notifying us, as stated below. 8. Medication Agreement: You are responsible for carefully reading and following our Medication Agreement. This must be signed before receiving any prescriptions from our practice. Safely store a copy of your signed Agreement. Violations to the Agreement will result in no further prescriptions. (Additional copies of our Medication Agreement are available upon request.) 9. Laws, Rules, & Regulations: All patients are expected to follow all Federal and State Laws, Statutes, Rules, & Regulations. Ignorance of the Laws does not constitute a valid excuse. The use of any illegal substances is prohibited. 10. Adopted CDC guidelines & recommendations: Target dosing levels will be at or below 60 MME/day. Use of benzodiazepines** is not recommended.  Exceptions: There are only two exceptions to the rule of not receiving pain medications from other Healthcare Providers. 1. Exception #1 (Emergencies): In the event of an emergency (i.e.: accident requiring emergency care), you are allowed to receive additional pain medication. However, you are responsible for: As soon as you are able, call our office [phone], at any time of the day or night, and leave a message stating your name, the  date and nature of the emergency, and the name and dose of the medication   prescribed. In the event that your call is answered by a member of our staff, make sure to document and save the date, time, and the name of the person that took your information.  2. Exception #2 (Planned Surgery): In the event that you are scheduled by another doctor or dentist to have any type of surgery or procedure, you are allowed (for a period no longer than 30 days), to receive additional pain medication, for the acute post-op pain. However, in this case, you are responsible for picking up a copy of our "Post-op Pain Management for Surgeons" handout, and giving it to your surgeon or dentist. This document is available at our office, and does not require an appointment to obtain it. Simply go to our office during business hours (Monday-Thursday from 8:00 AM to 4:00 PM) (Friday 8:00 AM to 12:00 Noon) or if you have a scheduled appointment with [name], prior to your surgery, and ask for it by name. In addition, you will need to provide [name] with your name, name of your surgeon, type of surgery, and date of procedure or surgery.  *Opioid medications include: morphine, codeine, oxycodone, oxymorphone, hydrocodone, hydromorphone, meperidine, tramadol, tapentadol, buprenorphine, fentanyl, methadone. **Benzodiazepine medications include: diazepam (Valium), alprazolam (Xanax), clonazepam (Klonopine), lorazepam (Ativan), clorazepate (Tranxene), chlordiazepoxide (Librium), estazolam (Prosom), oxazepam (Serax), temazepam (Restoril), triazolam (Halcion) (Last updated: 10/20/2017) ____________________________________________________________________________________________ Kristin [name] were given 3 prescriptions for Oxycodone today. Prescriptions for Melatonin, Bisacodyl. Miralax, and Requip were sent to your pharmacy.

## 2017-11-01 NOTE — Progress Notes (Signed)
Patient's Name: Kristin [name]  MRN: [id number]  Referring Provider: Kennedy, [name] Renee, *  DOB: 06/14/1930  PCP: [facility]  DOS: 11/01/2017  Note by:  M.  NP  Service setting: Ambulatory outpatient  Specialty: Interventional Pain Management  Location: [facility] (AMB) Pain Management Facility    Patient type: Established    Primary Reason(s) for Visit: Encounter for prescription drug management & post-procedure evaluation of chronic illness with mild to moderate exacerbation(Level of risk: moderate) CC: Back Pain (lower)  HPI  Kristin [name] is a 84 y.o. year old, female patient, who comes today for a post-procedure evaluation and medication management. She has Chronic kidney disease (CKD), stage III (moderate) (HCC); DDD (degenerative disc disease), lumbosacral; Gout; Benign essential HTN; Lumbar central spinal stenosis; Chronic obstructive pulmonary disease (HCC); Chemical diabetes; History of cardiac catheterization; Seasonal allergies; History of breast cancer; Chronic pain syndrome; Chronic low back pain (Primary Source of Pain) (Bilateral) (L>R); Lumbar facet syndrome (Bilateral) (L>R); Lumbar spondylosis; Chronic lumbar radicular pain (Bilateral) (L>R) (L5 Dermatome); Restless leg syndrome; Myofascial pain; Neurogenic pain; Long term current use of opiate analgesic; Long term prescription opiate use; Opiate use (90 MME/Day); Opioid-induced constipation (OIC); Vitamin D insufficiency; Grade 1 Retrolisthesis of L1 over L2; Lumbar facet hypertrophy (L1-2, L2-3, and L4-5); Lumbar lateral recess stenosis (L1-2 and L4-5); 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); Epidural fibrosis; Lumbar foraminal stenosis (Severe Left L4-5); 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); Osteoporosis; Chronic sacroiliac joint pain (Left); History of methicillin resistant staphylococcus aureus (MRSA); Mitral valve prolapse; GERD (gastroesophageal reflux disease);  Chronic shoulder pain (S/P replacement) (Right); Osteoarthritis, multiple sites; Shoulder pain S/P prostheses (Right); Chronic diastolic CHF (congestive heart failure) (HCC); S/P cardiac pacemaker procedure; Anticoagulated; Hyperlipidemia, unspecified; Chronic neck pain; Chronic hip pain (Bilateral); Chronic cervical radicular pain (Tertiary Area of Pain) (Bilateral) (L>R); Long term current use of anticoagulant; Chronic lower extremity pain (Secondary Area of Pain) (Bilateral) (L>R); DDD (degenerative disc disease), cervical; Numbness and tingling of upper extremity (Bilateral); Radicular pain of shoulder (Bilateral) (L>R); Cervical facet syndrome (Bilateral) (L>R); Cervical (3-4 mm) Anterolisthesis of C4 on C5; and Cervical foraminal stenosis (Bilateral) on their problem list. Her primarily concern today is the Back Pain (lower)  Pain Assessment: Location: Lower Back Radiating: left hip Onset: More than a month ago Quality: Constant, Discomfort Severity: 5 /10 (self-reported pain score)  Note: Reported level is compatible with observation.                          Effect on ADL: walking long distances Timing: Constant Modifying factors: denies, meds not working like they use too  Kristin [name] was last seen on 10/13/2017 for a procedure. During today's appointment we reviewed Kristin [name]'s post-procedure results, as well as her outpatient medication regimen. She admits that her restless leg syndrome seems to be getting worse. She does split her dose of Requip.  Further details on both, my assessment(s), as well as the proposed treatment plan, please see below.  Controlled Substance Pharmacotherapy Assessment REMS (Risk Evaluation and Mitigation Strategy)  Analgesic:Oxycodone IR 20 mg every 8 hours (60 mg/day) MME/day:90 mg/day   Garner, Cynthia F, RN  11/01/2017 11:56 AM  Sign at close encounter Nursing Pain Medication Assessment:  Safety precautions to be maintained throughout the  outpatient stay will include: orient to surroundings, keep bed in low position, maintain call bell within reach at all times, provide assistance with transfer out of bed and ambulation.    Medication Inspection Compliance: Pill count conducted under aseptic conditions, in front of the patient. Neither the pills nor the bottle was removed from the patient's sight at any time. Once count was completed pills were immediately returned to the patient in their original bottle.  Medication: See above Pill/Patch Count: 8 of 90 pills remain Pill/Patch Appearance: Markings consistent with prescribed medication Bottle Appearance: Standard pharmacy container. Clearly labeled. Filled Date: 2 / 79 / 2019 Last Medication intake:  Today   Pharmacokinetics: Liberation and absorption (onset of action): WNL Distribution (time to peak effect): WNL Metabolism and excretion (duration of action): WNL         Pharmacodynamics: Desired effects: Analgesia: Kristin [name] reports >50% benefit. Functional ability: Patient reports that medication allows her to accomplish basic ADLs Clinically meaningful improvement in function (CMIF): Sustained CMIF goals met Perceived effectiveness: Described as relatively effective, allowing for increase in activities of daily living (ADL) Undesirable effects: Side-effects or Adverse reactions: None reported Monitoring: [place] PMP: Online review of the past 57-monthperiod conducted. Compliant with practice rules and regulations Last UDS on record: Summary  Date Value Ref Range Status  03/01/2017 FINAL  Final    Comment:    ==================================================================== TOXASSURE SELECT 13 (MW) ==================================================================== Test                             Result       Flag       Units Drug Present and Declared for Prescription Verification   Oxycodone                      4800         EXPECTED   ng/mg creat   Oxymorphone                     1386         EXPECTED   ng/mg creat   Noroxycodone                   5976         EXPECTED   ng/mg creat   Noroxymorphone                 572          EXPECTED   ng/mg creat    Sources of oxycodone are scheduled prescription medications.    Oxymorphone, noroxycodone, and noroxymorphone are expected    metabolites of oxycodone. Oxymorphone is also available as a    scheduled prescription medication. ==================================================================== Test                      Result    Flag   Units      Ref Range   Creatinine              29               mg/dL      >=20 ==================================================================== Declared Medications:  The flagging and interpretation on this report are based on the  following declared medications.  Unexpected results may arise from  inaccuracies in the declared medications.  **Note: The testing scope of this panel includes these medications:  Oxycodone  **Note: The testing scope of this panel does not include following  reported medications:  Albuterol  Apixaban  Aspirin  Bisacodyl  Cholecalciferol  Colchicine  Furosemide  Levocetirizine  Lisinopril  Oxygen  Polyethylene Glycol  Ropinirole ==================================================================== For clinical consultation, please call [phone]. ====================================================================    UDS interpretation: Compliant          Medication Assessment Form: Reviewed. Patient indicates being compliant with therapy Treatment compliance: Compliant Risk Assessment Profile: Aberrant behavior: See prior evaluations. None observed or detected today Comorbid factors increasing risk of overdose: See prior notes. No additional risks detected today Risk of substance use disorder (SUD): Low Opioid Risk Tool - 11/01/17 1154      Family History of Substance Abuse   Alcohol  Negative    Illegal Drugs   Negative    Rx Drugs  Negative      Personal History of Substance Abuse   Alcohol  Negative    Illegal Drugs  Negative    Rx Drugs  Negative      Total Score   Opioid Risk Tool Scoring  0    Opioid Risk Interpretation  Low Risk      ORT Scoring interpretation table:  Score <3 = Low Risk for SUD  Score between 4-7 = Moderate Risk for SUD  Score >8 = High Risk for Opioid Abuse   Risk Mitigation Strategies:  Patient Counseling: Covered Patient-Prescriber Agreement (PPA): Present and active  Notification to other healthcare providers: Done  Pharmacologic Plan: No change in therapy, at this time.             Post-Procedure Assessment  10/13/2017 Procedure: left cervical ESI Pre-procedure pain score:  4/10 Post-procedure pain score: 2/10         Influential Factors: BMI: 24.02 kg/m Intra-procedural challenges: None observed.         Assessment challenges: None detected.              Reported side-effects: None.        Post-procedural adverse reactions or complications: None reported         Sedation: Please see nurses note. When no sedatives are used, the analgesic levels obtained are directly associated to the effectiveness of the local anesthetics. However, when sedation is provided, the level of analgesia obtained during the initial 1 hour following the intervention, is believed to be the result of a combination of factors. These factors may include, but are not limited to: 1. The effectiveness of the local anesthetics used. 2. The effects of the analgesic(s) and/or anxiolytic(s) used. 3. The degree of discomfort experienced by the patient at the time of the procedure. 4. The patients ability and reliability in recalling and recording the events. 5. The presence and influence of possible secondary gains and/or psychosocial factors. Reported result: Relief experienced during the 1st hour after the procedure: 100 % (Ultra-Short Term Relief)            Interpretative annotation:  Clinically appropriate result. Analgesia during this period is likely to be Local Anesthetic and/or IV Sedative (Analgesic/Anxiolytic) related.          Effects of local anesthetic: The analgesic effects attained during this period are directly associated to the localized infiltration of local anesthetics and therefore cary significant diagnostic value as to the etiological location, or anatomical origin, of the pain. Expected duration of relief is directly dependent on the pharmacodynamics of the local anesthetic used. Long-acting (4-6 hours) anesthetics used.  Reported result: Relief during the next 4 to 6 hour after the procedure: 100 % (Short-Term Relief)            Interpretative annotation: Clinically appropriate result. Analgesia during this period is likely   to be Local Anesthetic-related.          Long-term benefit: Defined as the period of time past the expected duration of local anesthetics (1 hour for short-acting and 4-6 hours for long-acting). With the possible exception of prolonged sympathetic blockade from the local anesthetics, benefits during this period are typically attributed to, or associated with, other factors such as analgesic sensory neuropraxia, antiinflammatory effects, or beneficial biochemical changes provided by agents other than the local anesthetics.  Reported result: Extended relief following procedure: 80 % (Long-Term Relief)            Interpretative annotation: Clinically appropriate result. Good relief. No permanent benefit expected. Inflammation plays a part in the etiology to the pain.          Current benefits: Defined as reported results that persistent at this point in time.   Analgesia: >50 %            Function: Ms. Blanchett reports improvement in function ROM: Ms. Avena reports improvement in ROM Interpretative annotation: Good relief.    Effective diagnostic intervention.          Interpretation: Results would suggest a successful diagnostic intervention.                   Plan:  Please see "Plan of Care" for details.                Laboratory Chemistry  Inflammation Markers (CRP: Acute Phase) (ESR: Chronic Phase) Lab Results  Component Value Date   CRP <0.5 09/24/2015   ESRSEDRATE 12 09/24/2015                         Rheumatology Markers No results found for: RF, ANA, LABURIC, URICUR, LYMEIGGIGMAB, LYMEABIGMQN              Renal Function Markers Lab Results  Component Value Date   BUN 19 08/22/2017   CREATININE 1.09 (H) 08/22/2017   GFRAA 52 (L) 08/22/2017   GFRNONAA 45 (L) 08/22/2017                 Hepatic Function Markers Lab Results  Component Value Date   AST 23 08/22/2017   ALT 11 (L) 08/22/2017   ALBUMIN 3.6 08/22/2017   ALKPHOS 60 08/22/2017                 Electrolytes Lab Results  Component Value Date   NA 139 08/22/2017   K 4.9 08/22/2017   CL 107 08/22/2017   CALCIUM 9.7 08/22/2017   MG 2.2 09/24/2015                        Neuropathy Markers No results found for: VITAMINB12, FOLATE, HGBA1C, HIV               Bone Pathology Markers No results found for: VD25OH, VD125OH2TOT, VD3125OH2, VD2125OH2, 25OHVITD1, 25OHVITD2, 25OHVITD3, TESTOFREE, TESTOSTERONE                       Coagulation Parameters Lab Results  Component Value Date   INR 1.24 12/16/2015   LABPROT 15.8 (H) 12/16/2015   APTT 26 12/16/2015   PLT 139 (L) 08/15/2017                 Cardiovascular Markers Lab Results  Component Value Date   TROPONINI <0.03 08/14/2017   HGB 11.6 (L) 08/15/2017   HCT   34.8 (L) 08/15/2017                 CA Markers Lab Results  Component Value Date   LABCA2 49.4 (H) 07/10/2013                 Note: Lab results reviewed.  Recent Diagnostic Imaging Results  DG C-Arm 1-60 Min-No Report Fluoroscopy was utilized by the requesting physician.  No radiographic  interpretation.   Complexity Note: Imaging results reviewed. Results shared with Ms. Marcom, using Layman's terms.                          Meds   Current Outpatient Medications:  .  albuterol (PROVENTIL) (2.5 MG/3ML) 0.083% nebulizer solution, Take 3 mLs (2.5 mg total) by nebulization every 8 (eight) hours as needed. Shortness of breath and wheezing, Disp: 75 mL, Rfl: 12 .  apixaban (ELIQUIS) 5 MG TABS tablet, Take 1 tablet (5 mg total) by mouth 2 (two) times daily., Disp: 60 tablet, Rfl: 0 .  Cholecalciferol (VITAMIN D) 2000 units tablet, Take 2,000 Units by mouth daily. , Disp: , Rfl:  .  furosemide (LASIX) 20 MG tablet, Take 0.5 tablets (10 mg total) by mouth daily., Disp: 45 tablet, Rfl: 1 .  levocetirizine (XYZAL) 5 MG tablet, Take 1 tablet (5 mg total) by mouth every evening., Disp: 30 tablet, Rfl: 12 .  lisinopril (PRINIVIL,ZESTRIL) 10 MG tablet, Take 5 mg by mouth daily., Disp: , Rfl:  .  [START ON 11/08/2017] Melatonin 10 MG CAPS, Take 20 mg by mouth at bedtime as needed., Disp: 60 capsule, Rfl: 2 .  [START ON 12/08/2017] Oxycodone HCl 20 MG TABS, Take 1 tablet (20 mg total) by mouth every 8 (eight) hours as needed., Disp: 90 tablet, Rfl: 0 .  OXYGEN, Inhale 2 L into the lungs at bedtime. , Disp: , Rfl:  .  bisacodyl (DULCOLAX) 5 MG EC tablet, Take 2 tablets (10 mg total) by mouth daily as needed for moderate constipation., Disp: 60 tablet, Rfl: 2 .  [START ON 01/07/2018] Oxycodone HCl 20 MG TABS, Take 1 tablet (20 mg total) by mouth every 8 (eight) hours as needed., Disp: 90 tablet, Rfl: 0 .  [START ON 11/08/2017] Oxycodone HCl 20 MG TABS, Take 1 tablet (20 mg total) by mouth every 8 (eight) hours as needed., Disp: 90 tablet, Rfl: 0 .  polyethylene glycol (MIRALAX / GLYCOLAX) packet, Take 17 g by mouth daily as needed for moderate constipation., Disp: 14 each, Rfl: 2 .  rOPINIRole (REQUIP) 2 MG tablet, Take 2 tablets at bedtime, Disp: 60 tablet, Rfl: 2  ROS  Constitutional: Denies any fever or chills Gastrointestinal: No reported hemesis, hematochezia, vomiting, or acute GI distress Musculoskeletal: Denies any acute onset  joint swelling, redness, loss of ROM, or weakness Neurological: No reported episodes of acute onset apraxia, aphasia, dysarthria, agnosia, amnesia, paralysis, loss of coordination, or loss of consciousness  Allergies  Ms. Hollen is allergic to flexeril [cyclobenzaprine hcl] and vancomycin.  PFSH  Drug: Ms. Brents  reports that she does not use drugs. Alcohol:  reports that she does not drink alcohol. Tobacco:  reports that she quit smoking about 18 years ago. Her smoking use included cigarettes. She has a 50.00 pack-year smoking history. she has never used smokeless tobacco. Medical:  has a past medical history of Arrhythmia, sinus node (04/03/2014), Arthritis, Arthritis, Atrial fibrillation (HCC), Back pain, Bradycardia, Breast cancer (HCC) (2004), Cancer (HCC) (2004), CHF (  congestive heart failure) (HCC), Chronic back pain, CKD (chronic kidney disease), COPD (chronic obstructive pulmonary disease) (HCC), Cystocele, Decubitus ulcers, Dehydration, Gout, Hematuria, Hepatitis C, Hyperlipidemia, Hypertension, Hypomagnesemia (06/25/2015), OAB (overactive bladder), Overactive bladder, Restless leg, Shoulder pain, bilateral, Urinary frequency, UTI (lower urinary tract infection), and Vaginal atrophy. Surgical: Ms. Garriga  has a past surgical history that includes Abdominal hysterectomy; Back surgery; Rotator cuff repair; Breast surgery; Cataract extraction w/PHACO (10/19/2011); Cataract extraction w/PHACO (11/02/2011); Carpal tunnel release; Joint replacement; Colon surgery; Pacemaker insertion (N/A, 12/24/2015); Cholecystectomy; Breast lumpectomy (Right, 2004); and Insert / replace / remove pacemaker. Family: family history includes Breast cancer in her paternal aunt; Breast cancer (age of onset: 50) in her sister; Cancer in her sister; Heart disease in her father and mother; Kidney disease in her brother; Stroke in her mother.  Constitutional Exam  General appearance: Well nourished, well developed, and well  hydrated. In no apparent acute distress Vitals:   11/01/17 1149  BP: (!) 154/88  Pulse: 75  Resp: 16  Temp: 97.7 F (36.5 C)  SpO2: 95%  Weight: 158 lb (71.7 kg)  Height: 5' 8" (1.727 m)   BMI Assessment: Estimated body mass index is 24.02 kg/m as calculated from the following:   Height as of this encounter: 5' 8" (1.727 m).   Weight as of this encounter: 158 lb (71.7 kg). Psych/Mental status: Alert, oriented x 3 (person, place, & time)       Eyes: PERLA Respiratory: No evidence of acute respiratory distress  Cervical Spine Area Exam  Skin & Axial Inspection: No masses, redness, edema, swelling, or associated skin lesions Alignment: Symmetrical Functional ROM: Unrestricted ROM      Stability: No instability detected Muscle Tone/Strength: Functionally intact. No obvious neuro-muscular anomalies detected. Sensory (Neurological): Unimpaired Palpation: Non-tender              Upper Extremity (UE) Exam    Side: Right upper extremity  Side: Left upper extremity  Skin & Extremity Inspection: Skin color, temperature, and hair growth are WNL. No peripheral edema or cyanosis. No masses, redness, swelling, asymmetry, or associated skin lesions. No contractures.  Skin & Extremity Inspection: Skin color, temperature, and hair growth are WNL. No peripheral edema or cyanosis. No masses, redness, swelling, asymmetry, or associated skin lesions. No contractures.  Functional ROM: Restricted ROM          Functional ROM: Adequate ROM          Muscle Tone/Strength: Functionally intact. No obvious neuro-muscular anomalies detected.  Muscle Tone/Strength: Functionally intact. No obvious neuro-muscular anomalies detected.  Sensory (Neurological): Unimpaired          Sensory (Neurological): Unimpaired          Palpation: No palpable anomalies              Palpation: No palpable anomalies              Specialized Test(s): Deferred         Specialized Test(s): Deferred           Gait & Posture Assessment   Ambulation: Patient ambulates using a cane Gait: Relatively normal for age and body habitus Posture: WNL   Lower Extremity Exam    Side: Right lower extremity  Side: Left lower extremity  Skin & Extremity Inspection: Skin color, temperature, and hair growth are WNL. No peripheral edema or cyanosis. No masses, redness, swelling, asymmetry, or associated skin lesions. No contractures.  Skin & Extremity Inspection: Skin color, temperature, and hair growth   are WNL. No peripheral edema or cyanosis. No masses, redness, swelling, asymmetry, or associated skin lesions. No contractures.  Functional ROM: Unrestricted ROM          Functional ROM: Unrestricted ROM          Muscle Tone/Strength: Functionally intact. No obvious neuro-muscular anomalies detected.  Muscle Tone/Strength: Functionally intact. No obvious neuro-muscular anomalies detected.  Sensory (Neurological): Unimpaired  Sensory (Neurological): Unimpaired  Palpation: No palpable anomalies  Palpation: No palpable anomalies   Assessment  Primary Diagnosis & Pertinent Problem List: The primary encounter diagnosis was Chronic neck pain. Diagnoses of Chronic shoulder pain (S/P replacement) (Right), Primary osteoarthritis involving multiple joints, Chronic pain syndrome, Restless leg syndrome, and Opioid-induced constipation (OIC) were also pertinent to this visit.  Status Diagnosis  Controlled Controlled Controlled 1. Chronic neck pain   2. Chronic shoulder pain (S/P replacement) (Right)   3. Primary osteoarthritis involving multiple joints   4. Chronic pain syndrome   5. Restless leg syndrome   6. Opioid-induced constipation (OIC)     Problems updated and reviewed during this visit: No problems updated. Plan of Care  Pharmacotherapy (Medications Ordered): Meds ordered this encounter  Medications  . rOPINIRole (REQUIP) 2 MG tablet    Sig: Take 2 tablets at bedtime    Dispense:  60 tablet    Refill:  2    Do not place this  medication, or any other prescription from our practice, on "Automatic Refill". Patient may have prescription filled one day early if pharmacy is closed on scheduled refill date.    Order Specific Question:   Supervising Provider    Answer:   NAVEIRA, FRANCISCO [[id number]]  . Oxycodone HCl 20 MG TABS    Sig: Take 1 tablet (20 mg total) by mouth every 8 (eight) hours as needed.    Dispense:  90 tablet    Refill:  0    Do not add this medication to the electronic "Automatic Refill" notification system. Patient may have prescription filled one day early if pharmacy is closed on scheduled refill date. Do not fill until:01/07/2018 To last until: 02/06/2018    Order Specific Question:   Supervising Provider    Answer:   NAVEIRA, FRANCISCO [[id number]]  . Oxycodone HCl 20 MG TABS    Sig: Take 1 tablet (20 mg total) by mouth every 8 (eight) hours as needed.    Dispense:  90 tablet    Refill:  0    Do not add this medication to the electronic "Automatic Refill" notification system. Patient may have prescription filled one day early if pharmacy is closed on scheduled refill date. Do not fill until:12/08/2017 To last until:01/07/2018    Order Specific Question:   Supervising Provider    Answer:   NAVEIRA, FRANCISCO [[id number]]  . Oxycodone HCl 20 MG TABS    Sig: Take 1 tablet (20 mg total) by mouth every 8 (eight) hours as needed.    Dispense:  90 tablet    Refill:  0    Do not add this medication to the electronic "Automatic Refill" notification system. Patient may have prescription filled one day early if pharmacy is closed on scheduled refill date. Do not fill until: 11/08/2017 To last until:12/08/2017    Order Specific Question:   Supervising Provider    Answer:   NAVEIRA, FRANCISCO [[id number]]  . Melatonin 10 MG CAPS    Sig: Take 20 mg by mouth at bedtime as needed.    Dispense:  60 capsule      Refill:  2    Do not place this medication, or any other prescription from our practice, on "Automatic Refill".  Patient may have prescription filled one day early if pharmacy is closed on scheduled refill date.    Order Specific Question:   Supervising Provider    Answer:   NAVEIRA, FRANCISCO [[id number]]  . polyethylene glycol (MIRALAX / GLYCOLAX) packet    Sig: Take 17 g by mouth daily as needed for moderate constipation.    Dispense:  14 each    Refill:  2    Do not place medication on "Automatic Refill". Fill one day early if pharmacy is closed on scheduled refill date.    Order Specific Question:   Supervising Provider    Answer:   NAVEIRA, FRANCISCO [[id number]]  . bisacodyl (DULCOLAX) 5 MG EC tablet    Sig: Take 2 tablets (10 mg total) by mouth daily as needed for moderate constipation.    Dispense:  60 tablet    Refill:  2    Do not place this medication, or any other prescription from our practice, on "Automatic Refill". Patient may have prescription filled one day early if pharmacy is closed on scheduled refill date.    Order Specific Question:   Supervising Provider    Answer:   NAVEIRA, FRANCISCO [[id number]]   New Prescriptions   No medications on file   Medications administered today: Auda L. Ambler had no medications administered during this visit. Lab-work, procedure(s), and/or referral(s): No orders of the defined types were placed in this encounter.  Imaging and/or referral(s): None  Interventional management options: Planned, scheduled, and/or pending: Not at this time May add one Requip. Considering Gabapentin for sleep and RLS    Considering:  Diagnostic bilateral sacroiliac joint block Possible bilateral sacroiliac joint RFA Diagnostic bilateral intra-articular hip joint injection Diagnostic bilateral femoral nerve + obturator nerve block Possible bilateral femoral nerve + obturator nerve RFA Possible bilateral lumbar facet radiofrequency. (Left side done on 12/13/2016) Palliative bilateral lumbar facet block  Diagnostic right intra-articular shoulder joint  injection Diagnostic right suprascapular nerve block Possible right suprascapular radiofrequencyablation.    Palliative PRN treatment(s):  Palliative bilateral lumbar facet block Diagnostic right intra-articular shoulder joint injection  Diagnostic right suprascapular nerve block Possible right suprascapular radiofrequencyablation   Provider-requested follow-up: Return in about 3 months (around 02/01/2018) for MedMgmt with Me ( ).  Future Appointments  Date Time Provider Department Center  01/18/2018 10:20 AM Kennedy, [name] Renee, NP [facility] None  02/01/2018 11:00 AM ,  M, NP [facility] None  03/27/2018 11:00 AM Kennedy, [name] Renee, NP [facility] None   Primary Care Physician: [facility] Location: [facility] Note by:  M.  NP Date: 11/01/2017; Time: 3:27 PM  Pain Score Disclaimer: We use the NRS-11 scale. This is a self-reported, subjective measurement of pain severity with only modest accuracy. It is used primarily to identify changes within a particular patient. It must be understood that outpatient pain scales are significantly less accurate that those used for research, where they can be applied under ideal controlled circumstances with minimal exposure to variables. In reality, the score is likely to be a combination of pain intensity and pain affect, where pain affect describes the degree of emotional arousal or changes in action readiness caused by the sensory experience of pain. Factors such as social and work situation, setting, emotional state, anxiety levels, expectation, and prior pain experience may influence pain perception and show large inter-individual   differences that may also be affected by time variables.  Patient instructions provided during this appointment: Patient Instructions   ____________________________________________________________________________________________  Medication Rules  Applies to: All patients receiving prescriptions (written or electronic).  Pharmacy of record: Pharmacy where electronic prescriptions will be sent. If written prescriptions are taken to a different pharmacy, please inform the nursing staff. The pharmacy listed in the electronic medical record should be the one where you would like electronic prescriptions to be sent.  Prescription refills: Only during scheduled appointments. Applies to both, written and electronic prescriptions.  NOTE: The following applies primarily to controlled substances (Opioid* Pain Medications).   Patient's responsibilities: 1. Pain Pills: Bring all pain pills to every appointment (except for procedure appointments). 2. Pill Bottles: Bring pills in original pharmacy bottle. Always bring newest bottle. Bring bottle, even if empty. 3. Medication refills: You are responsible for knowing and keeping track of what medications you need refilled. The day before your appointment, write a list of all prescriptions that need to be refilled. Bring that list to your appointment and give it to the admitting nurse. Prescriptions will be written only during appointments. If you forget a medication, it will not be "Called in", "Faxed", or "electronically sent". You will need to get another appointment to get these prescribed. 4. Prescription Accuracy: You are responsible for carefully inspecting your prescriptions before leaving our office. Have the discharge nurse carefully go over each prescription with you, before taking them home. Make sure that your name is accurately spelled, that your address is correct. Check the name and dose of your medication to make sure it is accurate. Check the number of pills, and the written instructions to make sure they are clear and accurate. Make sure that you are given enough medication to  last until your next medication refill appointment. 5. Taking Medication: Take medication as prescribed. Never take more pills than instructed. Never take medication more frequently than prescribed. Taking less pills or less frequently is permitted and encouraged, when it comes to controlled substances (written prescriptions).  6. Inform other Doctors: Always inform, all of your healthcare providers, of all the medications you take. 7. Pain Medication from other Providers: You are not allowed to accept any additional pain medication from any other Doctor or Healthcare provider. There are two exceptions to this rule. (see below) In the event that you require additional pain medication, you are responsible for notifying us, as stated below. 8. Medication Agreement: You are responsible for carefully reading and following our Medication Agreement. This must be signed before receiving any prescriptions from our practice. Safely store a copy of your signed Agreement. Violations to the Agreement will result in no further prescriptions. (Additional copies of our Medication Agreement are available upon request.) 9. Laws, Rules, & Regulations: All patients are expected to follow all Federal and [employer], Rules, [employer]. Ignorance of the Laws does not constitute a valid excuse. The use of any illegal substances is prohibited. 10. Adopted CDC guidelines & recommendations: Target dosing levels will be at or below 60 MME/day. Use of benzodiazepines** is not recommended.  Exceptions: There are only two exceptions to the rule of not receiving pain medications from other Healthcare Providers. 1. Exception #1 (Emergencies): In the event of an emergency (i.e.: accident requiring emergency care), you are allowed to receive additional pain medication. However, you are responsible for: As soon as you are able, call our office [phone], at any time of the day or night, and leave a  message stating your  name, the date and nature of the emergency, and the name and dose of the medication prescribed. In the event that your call is answered by a member of our staff, make sure to document and save the date, time, and the name of the person that took your information.  2. Exception #2 (Planned Surgery): In the event that you are scheduled by another doctor or dentist to have any type of surgery or procedure, you are allowed (for a period no longer than 30 days), to receive additional pain medication, for the acute post-op pain. However, in this case, you are responsible for picking up a copy of our "Post-op Pain Management for Surgeons" handout, and giving it to your surgeon or dentist. This document is available at our office, and does not require an appointment to obtain it. Simply go to our office during business hours (Monday-Thursday from 8:00 AM to 4:00 PM) (Friday 8:00 AM to 12:00 Noon) or if you have a scheduled appointment with [name], prior to your surgery, and ask for it by name. In addition, you will need to provide [name] with your name, name of your surgeon, type of surgery, and date of procedure or surgery.  *Opioid medications include: morphine, codeine, oxycodone, oxymorphone, hydrocodone, hydromorphone, meperidine, tramadol, tapentadol, buprenorphine, fentanyl, methadone. **Benzodiazepine medications include: diazepam (Valium), alprazolam (Xanax), clonazepam (Klonopine), lorazepam (Ativan), clorazepate (Tranxene), chlordiazepoxide (Librium), estazolam (Prosom), oxazepam (Serax), temazepam (Restoril), triazolam (Halcion) (Last updated: 10/20/2017) ____________________________________________________________________________________________ Dennis Bast were given 3 prescriptions for Oxycodone today. Prescriptions for Melatonin, Bisacodyl. Miralax, and Requip were sent to your pharmacy.

## 2017-11-15 ENCOUNTER — Other Ambulatory Visit: Payer: Self-pay

## 2017-11-15 DIAGNOSIS — I5032 Chronic diastolic (congestive) heart failure: Secondary | ICD-10-CM

## 2017-11-16 MED ORDER — FUROSEMIDE 20 MG PO TABS: 10.0000 mg | ORAL_TABLET | Freq: Every day | ORAL | 1 refills | Status: DC

## 2017-12-06 ENCOUNTER — Other Ambulatory Visit: Payer: Self-pay

## 2017-12-06 ENCOUNTER — Ambulatory Visit
Admission: RE | Admit: 2017-12-06 | Discharge: 2017-12-06 | Disposition: A | Discharge status: Home / Self Care | Payer: Medicare Other | Source: Ambulatory Visit | Attending: Nurse Practitioner | Admitting: Nurse Practitioner

## 2017-12-06 ENCOUNTER — Ambulatory Visit (INDEPENDENT_AMBULATORY_CARE_PROVIDER_SITE_OTHER): Payer: Medicare Other | Admitting: Nurse Practitioner

## 2017-12-06 ENCOUNTER — Encounter: Payer: Self-pay | Admitting: Nurse Practitioner

## 2017-12-06 VITALS — BP 118/65 | HR 63 | Temp 98.3°F | Resp 18 | Ht 68.0 in | Wt 158.6 lb

## 2017-12-06 DIAGNOSIS — Z950 Presence of cardiac pacemaker: Secondary | ICD-10-CM | POA: Diagnosis not present

## 2017-12-06 DIAGNOSIS — R05 Cough: Secondary | ICD-10-CM | POA: Insufficient documentation

## 2017-12-06 DIAGNOSIS — R059 Cough, unspecified: Secondary | ICD-10-CM

## 2017-12-06 DIAGNOSIS — R918 Other nonspecific abnormal finding of lung field: Secondary | ICD-10-CM | POA: Insufficient documentation

## 2017-12-06 DIAGNOSIS — J22 Unspecified acute lower respiratory infection: Secondary | ICD-10-CM

## 2017-12-06 DIAGNOSIS — I517 Cardiomegaly: Secondary | ICD-10-CM | POA: Diagnosis not present

## 2017-12-06 NOTE — Patient Instructions (Addendum)
Kristin [name],   Thank you for coming in to clinic today.  1. It sounds like you have a Upper Respiratory Virus (common cold) and bronchitis or pneumonia.  Viral infection will most likely run it's course in 7 to 10 days. Recommend good hand washing.  If it is pneumonia, I will send an antibiotic to your pharmacy.  We will know after your Xray results return. - Continue anti-histamine loratadine or cetirizine 10mg  daily, also can use Flonase 2 sprays each nostril daily for up to 4-6 weeks - CONTINUE OTC Mucinex (or may try Mucinex-DM for cough) up to 7-10 days then stop - Drink plenty of fluids to improve congestion - You may try over the counter Nasal Saline spray (Simply Saline, Ocean Spray) as needed to reduce congestion. - Drink warm herbal tea with honey for sore throat. - Start taking Tylenol extra strength 1 to 2 tablets every 6-8 hours for aches or fever/chills for next few days as needed.  Do not take more than 3,000 mg in 24 hours from all medicines.  If symptoms significantly worsening with persistent fevers/chills despite tylenol/ibpurofen, nausea, vomiting unable to tolerate food/fluids or medicine, body aches, or shortness of breath, sinus pain pressure or worsening productive cough, then follow-up for re-evaluation, may seek more immediate care at Urgent Care or ED if more concerned for emergency.  Please schedule a follow-up appointment with [name], AGNP. Return 5 days if symptoms worsen or fail to improve.  If you have any other questions or concerns, please feel free to call the clinic or send a message through [facility]. You may also schedule an earlier appointment if necessary.  You will receive a survey after today's visit either digitally by e-mail or paper by [employer]. Your experiences and feedback matter to [name].  Please respond so we know how we are doing as we provide care for you.   [name], DNP, AGNP-BC Adult Gerontology Nurse Practitioner [place]

## 2017-12-06 NOTE — Progress Notes (Signed)
Subjective:    [name], female    DOB: June 24, 1930, 84 y.o.   MRN: [id number]  Kristin [name] is a 84 y.o. female presenting on 12/06/2017 for Cough (productive cough, nasal drainage, head and chest congestion x5 days )   HPI Cough Patient presents today with cough and URI symptoms times 5 days.  Symptoms include productive cough, nasal drainage, postnasal drip, sinus and nasal congestion, sinus pressure. - No sick contacts - Pt initially though was allergies, but is not improving.  Has been taking allergy medicine daily for the last several weeks. - Mucinex is helping minimally, but is now out and has returned. - Overall, course of illness is stable since onset. - Pt denies fever, chills, sweats, nausea, vomiting, diarrhea and constipation.  Social History   Tobacco Use  . Smoking status: Former Smoker    Packs/day: 1.25    Years: 40.00    Pack years: 50.00    Types: Cigarettes    Last attempt to quit: 12/16/1998    Years since quitting: 18.9  . Smokeless tobacco: Never Used  Substance Use Topics  . Alcohol use: No    Alcohol/week: 0.0 oz  . Drug use: No    Review of Systems Per HPI unless specifically indicated above     Objective:    BP 118/65 (BP Location: Right Arm, Patient Position: Sitting, Cuff Size: Normal)   Pulse 63   Temp 98.3 F (36.8 C) (Oral)   Resp 18   Ht 5\' 8"  (1.727 m)   Wt 158 lb 9.6 oz (71.9 kg)   SpO2 100%   BMI 24.12 kg/m   Wt Readings from Last 3 Encounters:  12/06/17 158 lb 9.6 oz (71.9 kg)  11/01/17 158 lb (71.7 kg)  10/13/17 158 lb (71.7 kg)    Physical Exam  Constitutional: She appears well-developed and well-nourished. No distress.  HENT:  Head: Normocephalic and atraumatic.  Right Ear: Hearing, tympanic membrane, external ear and ear canal normal.  Left Ear: Hearing, tympanic membrane, external ear and ear canal normal.  Nose: Mucosal edema and rhinorrhea present. Right sinus exhibits maxillary sinus tenderness  and frontal sinus tenderness. Left sinus exhibits maxillary sinus tenderness and frontal sinus tenderness.  Mouth/Throat: Uvula is midline and mucous membranes are normal. Posterior oropharyngeal edema (cobblestoning) present. No oropharyngeal exudate (clear secretions), posterior oropharyngeal erythema or tonsillar abscesses.  Neck: Normal range of motion. Neck supple.  Cardiovascular: Normal rate, regular rhythm, S1 normal, S2 normal, normal heart sounds and intact distal pulses.  Pulmonary/Chest: Effort normal. No respiratory distress. She has no decreased breath sounds. She has wheezes in the right lower field and the left lower field. She has rhonchi (throughout all lobes, Rhonchi do not clear with cough). She has no rales.  Lymphadenopathy:    She has cervical adenopathy.       Right cervical: Deep cervical adenopathy present.  Neurological: She is alert.  Skin: Skin is warm and dry.  Psychiatric: She has a normal mood and affect. Her speech is normal and behavior is normal. Thought content normal. Cognition and memory are normal.  Vitals reviewed.  Results for orders placed or performed during the hospital encounter of 08/22/17  Comprehensive metabolic panel  Result Value Ref Range   Sodium 139 135 - 145 mmol/L   Potassium 4.9 3.5 - 5.1 mmol/L   Chloride 107 101 - 111 mmol/L   CO2 25 22 - 32 mmol/L   Glucose, Bld 73 65 - 99  mg/dL   BUN 19 6 - 20 mg/dL   Creatinine, Ser 1.09 (H) 0.44 - 1.00 mg/dL   Calcium 9.7 8.9 - 10.3 mg/dL   Total Protein 6.6 6.5 - 8.1 g/dL   Albumin 3.6 3.5 - 5.0 g/dL   AST 23 15 - 41 U/L   ALT 11 (L) 14 - 54 U/L   Alkaline Phosphatase 60 38 - 126 U/L   Total Bilirubin 0.5 0.3 - 1.2 mg/dL   GFR calc non Af Amer 45 (L) >60 mL/min   GFR calc Af Amer 52 (L) >60 mL/min   Anion gap 7 5 - 15      Assessment & Plan:   Problem List Items Addressed This Visit    None    Visit Diagnoses    Lower respiratory tract infection    -  Primary   Relevant Orders    DG Chest 2 View   Cough       Relevant Orders   DG Chest 2 View    Acute illness (nasopharyngitis with bronchitis or pneumonia).  Absence of fever more suggestive of bronchitis.  Symptoms not worsening. Consistent with viral illness x 5 days with no known sick contacts and no identifiable focal infections of ears, nose, throat.  Plan: 1. Reassurance, likely self-limited with cough lasting up to few weeks - Continue anti-histamine Loratadine or Cetirizine 10mg  daily,  - also can use Flonase 2 sprays each nostril daily for up to 4-6 weeks - Start Mucinex-DM OTC up to 7-10 days then stop 2. Supportive care with nasal saline, warm herbal tea with honey, 3. Improve hydration 4. Tylenol / Motrin PRN fevers 5. 2 View chest Xray today. 6. Return criteria given for 5-7 days  Personal review of Xray supports diagnosis of bronchitis.  Will monitor and provide antibiotics if no improvement in next 5-7 days.  Follow up plan: Return 5 days if symptoms worsen or fail to improve.  [name], DNP, AGPCNP-BC Adult Gerontology Primary Care Nurse Practitioner [facility] 12/06/2017, 10:44 AM

## 2017-12-26 ENCOUNTER — Encounter: Payer: Self-pay | Admitting: Pain Medicine

## 2017-12-26 ENCOUNTER — Ambulatory Visit: Payer: Medicare Other | Attending: Pain Medicine | Admitting: Pain Medicine

## 2017-12-26 VITALS — BP 138/92 | HR 77 | Temp 98.2°F | Resp 16 | Ht 69.0 in | Wt 158.0 lb

## 2017-12-26 DIAGNOSIS — M533 Sacrococcygeal disorders, not elsewhere classified: Secondary | ICD-10-CM | POA: Diagnosis not present

## 2017-12-26 DIAGNOSIS — I129 Hypertensive chronic kidney disease with stage 1 through stage 4 chronic kidney disease, or unspecified chronic kidney disease: Secondary | ICD-10-CM | POA: Insufficient documentation

## 2017-12-26 DIAGNOSIS — Z79891 Long term (current) use of opiate analgesic: Secondary | ICD-10-CM | POA: Insufficient documentation

## 2017-12-26 DIAGNOSIS — M961 Postlaminectomy syndrome, not elsewhere classified: Secondary | ICD-10-CM

## 2017-12-26 DIAGNOSIS — E559 Vitamin D deficiency, unspecified: Secondary | ICD-10-CM | POA: Diagnosis not present

## 2017-12-26 DIAGNOSIS — M47816 Spondylosis without myelopathy or radiculopathy, lumbar region: Secondary | ICD-10-CM | POA: Diagnosis not present

## 2017-12-26 DIAGNOSIS — M48061 Spinal stenosis, lumbar region without neurogenic claudication: Secondary | ICD-10-CM | POA: Diagnosis not present

## 2017-12-26 DIAGNOSIS — M109 Gout, unspecified: Secondary | ICD-10-CM | POA: Insufficient documentation

## 2017-12-26 DIAGNOSIS — Z96611 Presence of right artificial shoulder joint: Secondary | ICD-10-CM | POA: Diagnosis not present

## 2017-12-26 DIAGNOSIS — Z950 Presence of cardiac pacemaker: Secondary | ICD-10-CM | POA: Insufficient documentation

## 2017-12-26 DIAGNOSIS — K219 Gastro-esophageal reflux disease without esophagitis: Secondary | ICD-10-CM | POA: Diagnosis not present

## 2017-12-26 DIAGNOSIS — N183 Chronic kidney disease, stage 3 (moderate): Secondary | ICD-10-CM | POA: Insufficient documentation

## 2017-12-26 DIAGNOSIS — N3281 Overactive bladder: Secondary | ICD-10-CM | POA: Insufficient documentation

## 2017-12-26 DIAGNOSIS — M5442 Lumbago with sciatica, left side: Secondary | ICD-10-CM

## 2017-12-26 DIAGNOSIS — J449 Chronic obstructive pulmonary disease, unspecified: Secondary | ICD-10-CM | POA: Insufficient documentation

## 2017-12-26 DIAGNOSIS — M79604 Pain in right leg: Secondary | ICD-10-CM

## 2017-12-26 DIAGNOSIS — M4312 Spondylolisthesis, cervical region: Secondary | ICD-10-CM | POA: Insufficient documentation

## 2017-12-26 DIAGNOSIS — I341 Nonrheumatic mitral (valve) prolapse: Secondary | ICD-10-CM | POA: Insufficient documentation

## 2017-12-26 DIAGNOSIS — M5441 Lumbago with sciatica, right side: Secondary | ICD-10-CM

## 2017-12-26 DIAGNOSIS — M25511 Pain in right shoulder: Secondary | ICD-10-CM

## 2017-12-26 DIAGNOSIS — M5412 Radiculopathy, cervical region: Secondary | ICD-10-CM

## 2017-12-26 DIAGNOSIS — G894 Chronic pain syndrome: Secondary | ICD-10-CM | POA: Diagnosis not present

## 2017-12-26 DIAGNOSIS — F1190 Opioid use, unspecified, uncomplicated: Secondary | ICD-10-CM

## 2017-12-26 DIAGNOSIS — Z87891 Personal history of nicotine dependence: Secondary | ICD-10-CM | POA: Insufficient documentation

## 2017-12-26 DIAGNOSIS — E785 Hyperlipidemia, unspecified: Secondary | ICD-10-CM | POA: Insufficient documentation

## 2017-12-26 DIAGNOSIS — I5032 Chronic diastolic (congestive) heart failure: Secondary | ICD-10-CM | POA: Insufficient documentation

## 2017-12-26 DIAGNOSIS — G2581 Restless legs syndrome: Secondary | ICD-10-CM | POA: Insufficient documentation

## 2017-12-26 DIAGNOSIS — M4316 Spondylolisthesis, lumbar region: Secondary | ICD-10-CM | POA: Diagnosis not present

## 2017-12-26 DIAGNOSIS — M545 Low back pain: Secondary | ICD-10-CM | POA: Insufficient documentation

## 2017-12-26 DIAGNOSIS — Z79899 Other long term (current) drug therapy: Secondary | ICD-10-CM | POA: Insufficient documentation

## 2017-12-26 DIAGNOSIS — Z7901 Long term (current) use of anticoagulants: Secondary | ICD-10-CM | POA: Insufficient documentation

## 2017-12-26 DIAGNOSIS — I4891 Unspecified atrial fibrillation: Secondary | ICD-10-CM | POA: Insufficient documentation

## 2017-12-26 DIAGNOSIS — Z853 Personal history of malignant neoplasm of breast: Secondary | ICD-10-CM | POA: Diagnosis not present

## 2017-12-26 DIAGNOSIS — G8929 Other chronic pain: Secondary | ICD-10-CM

## 2017-12-26 DIAGNOSIS — T8484XA Pain due to internal orthopedic prosthetic devices, implants and grafts, initial encounter: Secondary | ICD-10-CM

## 2017-12-26 DIAGNOSIS — M4722 Other spondylosis with radiculopathy, cervical region: Secondary | ICD-10-CM | POA: Insufficient documentation

## 2017-12-26 DIAGNOSIS — M5137 Other intervertebral disc degeneration, lumbosacral region: Secondary | ICD-10-CM | POA: Insufficient documentation

## 2017-12-26 DIAGNOSIS — M79605 Pain in left leg: Secondary | ICD-10-CM

## 2017-12-26 DIAGNOSIS — M4802 Spinal stenosis, cervical region: Secondary | ICD-10-CM | POA: Insufficient documentation

## 2017-12-26 DIAGNOSIS — B1920 Unspecified viral hepatitis C without hepatic coma: Secondary | ICD-10-CM | POA: Insufficient documentation

## 2017-12-26 DIAGNOSIS — Z8614 Personal history of Methicillin resistant Staphylococcus aureus infection: Secondary | ICD-10-CM | POA: Insufficient documentation

## 2017-12-26 DIAGNOSIS — I2510 Atherosclerotic heart disease of native coronary artery without angina pectoris: Secondary | ICD-10-CM | POA: Insufficient documentation

## 2017-12-26 NOTE — Patient Instructions (Addendum)
____________________________________________________________________________________________  Preparing for Procedure with Sedation  Instructions: . Oral Intake: Do not eat or drink anything for at least 8 hours prior to your procedure. . Transportation: Public transportation is not allowed. Bring an adult driver. The driver must be physically present in our waiting room before any procedure can be started. Marland Kitchen Physical Assistance: Bring an adult physically capable of assisting you, in the event you need help. This adult should keep you company at home for at least 6 hours after the procedure. . Blood Pressure Medicine: Take your blood pressure medicine with a sip of water the morning of the procedure. . Blood thinners:  . Diabetics on insulin: Notify the staff so that you can be scheduled 1st case in the morning. If your diabetes requires high dose insulin, take only  of your normal insulin dose the morning of the procedure and notify the staff that you have done so. . Preventing infections: Shower with an antibacterial soap the morning of your procedure. . Build-up your immune system: Take 1000 mg of Vitamin C with every meal (3 times a day) the day prior to your procedure. Marland Kitchen Antibiotics: Inform the staff if you have a condition or reason that requires you to take antibiotics before dental procedures. . Pregnancy: If you are pregnant, call and cancel the procedure. . Sickness: If you have a cold, fever, or any active infections, call and cancel the procedure. . Arrival: You must be in the facility at least 30 minutes prior to your scheduled procedure. . Children: Do not bring children with you. . Dress appropriately: Bring dark clothing that you would not mind if they get stained. . Valuables: Do not bring any jewelry or valuables.  Procedure appointments are reserved for interventional treatments only. Marland Kitchen No Prescription Refills. . No medication changes will be discussed during procedure  appointments. . No disability issues will be discussed.  Remember:  Regular Business hours are:  Monday to Thursday 8:00 AM to 4:00 PM  Provider's Schedule: Milinda Pointer, MD:  Procedure days: Tuesday and Thursday 7:30 AM to 4:00 PM  Gillis Santa, MD:  Procedure days: Monday and Wednesday 7:30 AM to 4:00 PM ____________________________________________________________________________________________   ____________________________________________________________________________________________  Pain Scale  Introduction: The pain score used by this practice is the Verbal Numerical Rating Scale (VNRS-11). This is an 11-point scale. It is for adults and children 10 years or older. There are significant differences in how the pain score is reported, used, and applied. Forget everything you learned in the past and learn this scoring system.  General Information: The scale should reflect your current level of pain. Unless you are specifically asked for the level of your worst pain, or your average pain. If you are asked for one of these two, then it should be understood that it is over the past 24 hours.  Basic Activities of Daily Living (ADL): Personal hygiene, dressing, eating, transferring, and using restroom.  Instructions: Most patients tend to report their level of pain as a combination of two factors, their physical pain and their psychosocial pain. This last one is also known as "suffering" and it is reflection of how physical pain affects you socially and psychologically. From now on, report them separately. From this point on, when asked to report your pain level, report only your physical pain. Use the following table for reference.  Pain Clinic Pain Levels (0-5/10)  Pain Level Score  Description  No Pain 0   Mild pain 1 Nagging, annoying, but does not interfere  with basic activities of daily living (ADL). Patients are able to eat, bathe, get dressed, toileting (being able to  get on and off the toilet and perform personal hygiene functions), transfer (move in and out of bed or a chair without assistance), and maintain continence (able to control bladder and bowel functions). Blood pressure and heart rate are unaffected. A normal heart rate for a healthy adult ranges from 60 to 100 bpm (beats per minute).   Mild to moderate pain 2 Noticeable and distracting. Impossible to hide from other people. More frequent flare-ups. Still possible to adapt and function close to normal. It can be very annoying and may have occasional stronger flare-ups. With discipline, patients may get used to it and adapt.   Moderate pain 3 Interferes significantly with activities of daily living (ADL). It becomes difficult to feed, bathe, get dressed, get on and off the toilet or to perform personal hygiene functions. Difficult to get in and out of bed or a chair without assistance. Very distracting. With effort, it can be ignored when deeply involved in activities.   Moderately severe pain 4 Impossible to ignore for more than a few minutes. With effort, patients may still be able to manage work or participate in some social activities. Very difficult to concentrate. Signs of autonomic nervous system discharge are evident: dilated pupils (mydriasis); mild sweating (diaphoresis); sleep interference. Heart rate becomes elevated (>115 bpm). Diastolic blood pressure (lower number) rises above 100 mmHg. Patients find relief in laying down and not moving.   Severe pain 5 Intense and extremely unpleasant. Associated with frowning face and frequent crying. Pain overwhelms the senses.  Ability to do any activity or maintain social relationships becomes significantly limited. Conversation becomes difficult. Pacing back and forth is common, as getting into a comfortable position is nearly impossible. Pain wakes you up from deep sleep. Physical signs will be obvious: pupillary dilation; increased sweating; goosebumps;  brisk reflexes; cold, clammy hands and feet; nausea, vomiting or dry heaves; loss of appetite; significant sleep disturbance with inability to fall asleep or to remain asleep. When persistent, significant weight loss is observed due to the complete loss of appetite and sleep deprivation.  Blood pressure and heart rate becomes significantly elevated. Caution: If elevated blood pressure triggers a pounding headache associated with blurred vision, then the patient should immediately seek attention at an urgent or emergency care unit, as these may be signs of an impending stroke.    Emergency Department Pain Levels (6-10/10)  Emergency Room Pain 6 Severely limiting. Requires emergency care and should not be seen or managed at an outpatient pain management facility. Communication becomes difficult and requires great effort. Assistance to reach the emergency department may be required. Facial flushing and profuse sweating along with potentially dangerous increases in heart rate and blood pressure will be evident.   Distressing pain 7 Self-care is very difficult. Assistance is required to transport, or use restroom. Assistance to reach the emergency department will be required. Tasks requiring coordination, such as bathing and getting dressed become very difficult.   Disabling pain 8 Self-care is no longer possible. At this level, pain is disabling. The individual is unable to do even the most "basic" activities such as walking, eating, bathing, dressing, transferring to a bed, or toileting. Fine motor skills are lost. It is difficult to think clearly.   Incapacitating pain 9 Pain becomes incapacitating. Thought processing is no longer possible. Difficult to remember your own name. Control of movement and coordination are lost.  The worst pain imaginable 10 At this level, most patients pass out from pain. When this level is reached, collapse of the autonomic nervous system occurs, leading to a sudden drop in  blood pressure and heart rate. This in turn results in a temporary and dramatic drop in blood flow to the brain, leading to a loss of consciousness. Fainting is one of the body's self defense mechanisms. Passing out puts the brain in a calmed state and causes it to shut down for a while, in order to begin the healing process.    Summary: 1. Refer to this scale when providing [name] with your pain level. 2. Be accurate and careful when reporting your pain level. This will help with your care. 3. Over-reporting your pain level will lead to loss of credibility. 4. Even a level of 1/10 means that there is pain and will be treated at our facility. 5. High, inaccurate reporting will be documented as "Symptom Exaggeration", leading to loss of credibility and suspicions of possible secondary gains such as obtaining more narcotics, or wanting to appear disabled, for fraudulent reasons. 6. Only pain levels of 5 or below will be seen at our facility. 7. Pain levels of 6 and above will be sent to the Emergency Department and the appointment cancelled. ____________________________________________________________________________________________   Radiofrequency Lesioning Radiofrequency lesioning is a procedure that is performed to relieve pain. The procedure is often used for back, neck, or arm pain. Radiofrequency lesioning involves the use of a machine that creates radio waves to make heat. During the procedure, the heat is applied to the nerve that carries the pain signal. The heat damages the nerve and interferes with the pain signal. Pain relief usually starts about 2 weeks after the procedure and lasts for 6 months to 1 year. Tell a health care provider about:  Any allergies you have.  All medicines you are taking, including vitamins, herbs, eye drops, creams, and over-the-counter medicines.  Any problems you or family members have had with anesthetic medicines.  Any blood disorders you have.  Any  surgeries you have had.  Any medical conditions you have.  Whether you are pregnant or may be pregnant. What are the risks? Generally, this is a safe procedure. However, problems may occur, including:  Pain or soreness at the injection site.  Infection at the injection site.  Damage to nerves or blood vessels.  What happens before the procedure?  Ask your health care provider about: ? Changing or stopping your regular medicines. This is especially important if you are taking diabetes medicines or blood thinners. ? Taking medicines such as aspirin and ibuprofen. These medicines can thin your blood. Do not take these medicines before your procedure if your health care provider instructs you not to.  Follow instructions from your health care provider about eating or drinking restrictions.  Plan to have someone take you home after the procedure.  If you go home right after the procedure, plan to have someone with you for 24 hours. What happens during the procedure?  You will be given one or more of the following: ? A medicine to help you relax (sedative). ? A medicine to numb the area (local anesthetic).  You will be awake during the procedure. You will need to be able to talk with the health care provider during the procedure.  With the help of a type of X-ray (fluoroscopy), the health care provider will insert a radiofrequency needle into the area to be treated.  Next, a  wire that carries the radio waves (electrode) will be put through the radiofrequency needle. An electrical pulse will be sent through the electrode to verify the correct nerve. You will feel a tingling sensation, and you may have muscle twitching.  Then, the tissue that is around the needle tip will be heated by an electric current that is passed using the radiofrequency machine. This will numb the nerves.  A bandage (dressing) will be put on the insertion area after the procedure is done. The procedure may vary  among health care providers and hospitals. What happens after the procedure?  Your blood pressure, heart rate, breathing rate, and blood oxygen level will be monitored often until the medicines you were given have worn off.  Return to your normal activities as directed by your health care provider. This information is not intended to replace advice given to you by your health care provider. Make sure you discuss any questions you have with your health care provider. Document Released: 04/07/2011 Document Revised: 01/15/2016 Document Reviewed: 09/16/2014 Elsevier Interactive Patient Education  2018 [employer]. ___________________________________________________________________________________________  Post-Radiofrequency (RF) Discharge Instructions  You have just completed a Radiofrequency Neurotomy.  The following instructions will provide you with information and guidelines for self-care upon discharge.  If at any time you have questions or concerns please call your physician. DO NOT DRIVE YOURSELF!!  Instructions:  Apply ice: Fill a plastic sandwich bag with crushed ice. Cover it with a small towel and apply to injection site. Apply for 15 minutes then remove x 15 minutes. Repeat sequence on day of procedure, until you go to bed. The purpose is to minimize swelling and discomfort after procedure.  Apply heat: Apply heat to procedure site starting the day following the procedure. The purpose is to treat any soreness and discomfort from the procedure.  Food intake: No eating limitations, unless stipulated above.  Nevertheless, if you have had sedation, you may experience some nausea.  In this case, it may be wise to wait at least two hours prior to resuming regular diet.  Physical activities: Keep activities to a minimum for the first 8 hours after the procedure. For the first 24 hours after the procedure, do not drive a motor vehicle,  Operate heavy machinery, power tools, or handle any  weapons.  Consider walking with the use of an assistive device or accompanied by an adult for the first 24 hours.  Do not drink alcoholic beverages including beer.  Do not make any important decisions or sign any legal documents. Go home and rest today.  Resume activities tomorrow, as tolerated.  Use caution in moving about as you may experience mild leg weakness.  Use caution in cooking, use of household electrical appliances and climbing steps.  Driving: If you have received any sedation, you are not allowed to drive for 24 hours after your procedure.  Blood thinner: Restart your blood thinner 6 hours after your procedure. (Only for those taking blood thinners)  Insulin: As soon as you can eat, you may resume your normal dosing schedule. (Only for those taking insulin)  Medications: May resume pre-procedure medications.  Do not take any drugs, other than what has been prescribed to you.  Infection prevention: Keep procedure site clean and dry.  Post-procedure Pain Diary: Extremely important that this be done correctly and accurately. Recorded information will be used to determine the next step in treatment.  Pain evaluated is that of treated area only. Do not include pain from an untreated area.  Complete every hour, on the hour, for the initial 8 hours. Set an alarm to help you do this part accurately.  Do not go to sleep and have it completed later. It will not be accurate.  Follow-up appointment: Keep your follow-up appointment after the procedure. Usually 2 weeks for most procedures. (6 weeks in the case of radiofrequency.) Bring you pain diary.   Expect:  From numbing medicine (AKA: Local Anesthetics): Numbness or decrease in pain.  Onset: Full effect within 15 minutes of injected.  Duration: It will depend on the type of local anesthetic used. On the average, 1 to 8 hours.   From steroids: Decrease in swelling or inflammation. Once inflammation is improved, relief of the pain  will follow.  Onset of benefits: Depends on the amount of swelling present. The more swelling, the longer it will take for the benefits to be seen. In some cases, up to 10 days.  Duration: Steroids will stay in the system x 2 weeks. Duration of benefits will depend on multiple posibilities including persistent irritating factors.  From procedure: Some discomfort is to be expected once the numbing medicine wears off. This should be minimal if ice and heat are applied as instructed.  Call if:  You experience numbness and weakness that gets worse with time, as opposed to wearing off.  He experience any unusual bleeding, difficulty breathing, or loss of the ability to control your bowel and bladder. (This applies to Spinal procedures only)  You experience any redness, swelling, heat, red streaks, elevated temperature, fever, or any other signs of a possible infection.  Emergency Numbers:  Durning business hours (Monday - Thursday, 8:00 AM - 4:00 PM) (Friday, 9:00 AM - 12:00 Noon): [phone]  After hours: [phone] ____________________________________________________________________________________________  ____________________________________________________________________________________________  Blood Thinners  Recommended Time Interval Before and After Neuraxial Block or Catheter Removal  Drug (Generic) Brand Name Time Before Time After Comments  Abciximab Reopro 15 days 2 hours   Alteplase Activase 10 days 10 days   Apixaban Eliquis 3 days 6 hours   Aspirin > 325 mg Goody Powders/Excedrin 11 days  (Usually not stopped)  Aspirin ? 81 mg  7 days  (Usually not stopped)  Cholesterol Medication Lipitor 4 days    Cilostazol Pletal 3 days 5 hours   Clopidogrel Plavix 7-10 days 2 hours   Dabigatran Pradaxa 5 days 6 hours   Delteparin Fragmin 24 hours 4 hours   Dipyridamole + ASA Aggrenox 11days 2 hours   Enoxaparin  Lovenox 24 hours 4 hours   Eptifibatide Integrillin 8  hours 2 hours   Fish oil  4 days    Fondaparinux  Arixtra 72 hours 12 hours   Garlic supplements  7 days    Ginkgo biloba  36 hours    Ginseng  24 hours    Heparin (IV)  4 hours 2 hours   Heparin ([place])  12 hours 2 hours   Hydroxychloroquine Plaquenil 11 days    LMW Heparin  24 hours    LMWH  24 hours    NSAIDs  3 days  (Usually not stopped)  Prasugrel Effient 7-10 days 6 hours   Reteplase Retavase 10 days 10 days   Rivaroxaban Xarelto 3 days 6 hours   Streptokinase Streptase 10 days 10 days   Tenecteplase TNKase 10 days 10 days   Thrombolytics  10 days  10 days Avoid x 10 days after inj.  Ticagrelor Brilinta 5-7 days 6 hours   Ticlodipine Ticlid 10-14  days 2 hours   Tinzaparin Innohep 24 hours 4 hours   Tirofiban Aggrastat 8 hours 2 hours   Vitamin E  4 days    Warfarin Coumadin 5 days 2 hours   ____________________________________________________________________________________________

## 2017-12-26 NOTE — Progress Notes (Signed)
Patient's Name: Kristin [name]  MRN: [id number]  Referring Provider: Mikey College, *  DOB: 27-Apr-1930  PCP: Mikey College, NP  DOS: 12/26/2017  Note by: Gaspar Cola, MD  Service setting: Ambulatory outpatient  Specialty: Interventional Pain Management  Location: [facility] (AMB) Pain Management Facility    Patient type: Established   Primary Reason(s) for Visit: Evaluation of chronic illnesses with exacerbation, or progression (Level of risk: moderate) CC: Back Pain (lower left is worse ) and Shoulder Pain (right s/p sugery 25 years ago)  HPI  [name] is a 84 y.o. year old, female patient, who comes today for a follow-up evaluation. She has Chronic kidney disease (CKD), stage III (moderate) (HCC); DDD (degenerative disc disease), lumbosacral; Gout; Benign essential HTN; Lumbar central spinal stenosis; Chronic obstructive pulmonary disease ([place]); Chemical diabetes; History of cardiac catheterization; Seasonal allergies; History of breast cancer; Chronic pain syndrome; Chronic low back pain (Primary Source of Pain) (Bilateral) (L>R); Lumbar facet syndrome (Bilateral) (L>R); Lumbar spondylosis; Chronic lumbar radicular pain (Bilateral) (L>R) (L5 Dermatome); Restless leg syndrome; Myofascial pain; Neurogenic pain; Long term current use of opiate analgesic; Long term prescription opiate use; Opiate use (90 MME/Day); Opioid-induced constipation (OIC); Vitamin D insufficiency; Grade 1 Retrolisthesis of L1 over L2; Lumbar facet hypertrophy (L1-2, L2-3, and L4-5); Lumbar lateral recess stenosis (L1-2 and L4-5); 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); Epidural fibrosis; Lumbar foraminal stenosis (Severe Left L4-5); 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); Osteoporosis; Chronic sacroiliac joint pain (Left); History of methicillin resistant staphylococcus aureus (MRSA); Mitral valve prolapse; GERD (gastroesophageal reflux disease); Chronic shoulder pain (S/P  replacement) (Right); Osteoarthritis, multiple sites; Shoulder pain S/P prostheses (Right); Chronic diastolic CHF (congestive heart failure) ([place]); S/P cardiac pacemaker procedure; Anticoagulated; Hyperlipidemia, unspecified; Chronic neck pain; Chronic hip pain (Bilateral); Chronic cervical radicular pain (Tertiary Area of Pain) (Bilateral) (L>R); Long term current use of anticoagulant; Chronic lower extremity pain (Secondary Area of Pain) (Bilateral) (L>R); DDD (degenerative disc disease), cervical; Numbness and tingling of upper extremity (Bilateral); Radicular pain of shoulder (Bilateral) (L>R); Cervical facet syndrome (Bilateral) (L>R); Cervical (3-4 mm) Anterolisthesis of C4 on C5; and Cervical foraminal stenosis (Bilateral) on their problem list. [name] was last seen on 10/13/2017. Her primarily concern today is the Back Pain (lower left is worse ) and Shoulder Pain (right s/p sugery 25 years ago)  Pain Assessment: Location: Lower, Left(right ) Back(shoulder) Radiating: back pain goes into left hip.   Onset: More than a month ago Severity: 7 /10 (subjective, self-reported pain score)  Note: Reported level is inconsistent with clinical observations. Clinically the patient looks like a 3/10 A 3/10 is viewed as "Moderate" and described as significantly interfering with activities of daily living (ADL). It becomes difficult to feed, bathe, get dressed, get on and off the toilet or to perform personal hygiene functions. Difficult to get in and out of bed or a chair without assistance. Very distracting. With effort, it can be ignored when deeply involved in activities. [name] continues to use a standard subjective pain scale, rather than an objective pain scale as instructed. When using our objective Pain Scale, levels between 6 and 10/10 are said to belong in an emergency room, as it progressively worsens from a 6/10, described as severely limiting, requiring emergency care not usually available at an  outpatient pain management facility. At a 6/10 level, communication becomes difficult and requires great effort. Assistance to reach the emergency department may be required. Facial flushing and profuse sweating along with potentially  dangerous increases in heart rate and blood pressure will be evident. Effect on ADL: limited ROM with the right shoulder.  unable to do what she would like to do.  Timing: Constant Modifying factors: medications help a little bit BP: (!) 138/92  HR: 77  Further details on both, my assessment(s), as well as the proposed treatment plan, please see below.  Laboratory Chemistry  Inflammation Markers (CRP: Acute Phase) (ESR: Chronic Phase) Lab Results  Component Value Date   CRP <0.5 09/24/2015   ESRSEDRATE 12 09/24/2015                         Renal Function Markers Lab Results  Component Value Date   BUN 19 08/22/2017   CREATININE 1.09 (H) 08/22/2017   GFRAA 52 (L) 08/22/2017   GFRNONAA 45 (L) 08/22/2017                              Hepatic Function Markers Lab Results  Component Value Date   AST 23 08/22/2017   ALT 11 (L) 08/22/2017   ALBUMIN 3.6 08/22/2017   ALKPHOS 60 08/22/2017                        Electrolytes Lab Results  Component Value Date   NA 139 08/22/2017   K 4.9 08/22/2017   CL 107 08/22/2017   CALCIUM 9.7 08/22/2017   MG 2.2 09/24/2015                        Coagulation Parameters Lab Results  Component Value Date   INR 1.24 12/16/2015   LABPROT 15.8 (H) 12/16/2015   APTT 26 12/16/2015   PLT 139 (L) 08/15/2017                        Cardiovascular Markers Lab Results  Component Value Date   TROPONINI <0.03 08/14/2017   HGB 11.6 (L) 08/15/2017   HCT 34.8 (L) 08/15/2017                         CA Markers Lab Results  Component Value Date   LABCA2 49.4 (H) 07/10/2013                        Note: Lab results reviewed.  Recent Diagnostic Imaging Review  Cervical Imaging: Cervical DG complete:  Results  for orders placed during the hospital encounter of 03/01/17  DG Cervical Spine Complete   Narrative CLINICAL DATA:  Pt reports neck pain radiating to left shoulder x 1 week. Denies injury. Denies hx of the same  EXAM: CERVICAL SPINE - COMPLETE 4+ VIEW  COMPARISON:  None.  FINDINGS: Disc desiccations at the C5-6 and C6-7 levels, moderate in degree, with associated disc space narrowings and mild osseous spurring. Associated osseous neural foramen narrowings noted at multiple levels.  3-4 mm anterolisthesis of C4 on C5, likely related to the underlying degenerative change, appearing slightly more pronounced on the swimmer's view. Also mild scoliosis.  No acute or suspicious osseous finding. No fracture line or displaced fracture fragment seen.  Bilateral carotid atherosclerosis. Visualized paravertebral soft tissues are otherwise unremarkable.  IMPRESSION: 1. Degenerative changes of the mid and lower cervical spine, mild to moderate in degree, with associated osseous neural foramen narrowings with possible associated  nerve root impingement. If symptomatic could be radiculopathic in nature, would consider nonemergent cervical spine MRI for further characterization. 2. 3-4 mm anterolisthesis of C4 on C5, likely related to the underlying degenerative change. This anterolisthesis appears slightly more pronounced on the swimmer's view raising the possibility of an associated instability at this level. Consider further plain film evaluation with flexion and extension views. 3. No acute appearing findings.   Electronically Signed   By: Franki Cabot M.D.   On: 03/01/2017 15:38    Shoulder Imaging: Shoulder-L CT wo contrast:  Results for orders placed during the hospital encounter of 01/04/11  CT Shoulder Left Wo Contrast   Narrative *RADIOLOGY REPORT*  Clinical Data: Left shoulder pain for several months.  Weakness.  CT OF THE LEFT SHOULDER WITHOUT CONTRAST  Technique:   Multidetector CT imaging was performed according to the standard protocol. Multiplanar CT image reconstructions were also generated.  Comparison: None.  Findings: The scan demonstrates severe osteoarthritis of the glenohumeral joint with subchondral cysts and extensive erosions of the humeral head and glenoid.  There is marked narrowing of the space between the acromion and humeral head consistent with a chronic full-thickness rotator cuff tear.   There is severe atrophy of the infraspinatus and supraspinatus muscles.  There is a type 3 acromion with erosions of the distal clavicle.  Note is made of emphysematous disease in the left lung.  There is also a 6.4 mm nodule in the left upper lobe along the major fissure.  This could be an intrapulmonary lymph node but I cannot exclude a lung neoplasm.  I recommend a follow-up chest CT scan in 3-6 months without contrast for further evaluation of this nodule.  Note is made of extensive coronary artery calcification.  IMPRESSION:  1.  Severe osteoarthritis of the left glenohumeral joint with a chronic rotator cuff tear and severe atrophy of the infraspinatus and supraspinatus muscles. 2.  6 mm nodule in the left lung along the major fissure, possibly an intrapulmonary lymph node.  However, I cannot exclude a lung neoplasm.  Follow-up CT scan without contrast in 3-6 months is recommended. 3.  Emphysematous disease. 4.  Extensive coronary artery calcifications.  Original Report Authenticated By: Larey Seat, M.D.   Shoulder-L DG:  Results for orders placed during the hospital encounter of 08/22/16  DG Shoulder Left   Narrative CLINICAL DATA:  Shoulder pain after taking down Christmas lights.  EXAM: LEFT SHOULDER - 2+ VIEW  COMPARISON:  LEFT shoulder radiograph February 19, 2011  FINDINGS: Bony fragments project in the inferior glenohumeral joint space, no donor site. No dislocation. LEFT shoulder arthroplasty.  Intact well-seated non cemented hardware without periprosthetic lucency. Cardiac pacemaker LEFT chest. Soft tissue planes are nonsuspicious.  IMPRESSION: Bony fragments LEFT inferior glenohumeral joint space concerning for acute fracture, possible heterotopic ossification.   Electronically Signed   By: Elon Alas M.D.   On: 08/22/2016 22:21    Sacroiliac Joint Imaging: Sacroiliac Joint DG:  Results for orders placed during the hospital encounter of 04/20/17  DG Si Joints   Narrative CLINICAL DATA:  Hip pain, no known injury, initial encounter  EXAM: BILATERAL SACROILIAC JOINTS - 3+ VIEW  COMPARISON:  None.  FINDINGS: Degenerative changes are noted at the lumbosacral junction. The sacral ala are within normal limits. No significant sclerosis of the sacroiliac joints is noted. Degenerative changes of the hip joints right greater than left is noted.  IMPRESSION: No acute abnormality seen.   Electronically Signed   By:  [name] M.D.   On: 04/20/2017 15:43    Hip Imaging: Hip-R DG 2-3 views:  Results for orders placed during the hospital encounter of 04/20/17  DG HIP UNILAT W OR W/O PELVIS 2-3 VIEWS RIGHT   Narrative CLINICAL DATA:  Bilateral hip pain  EXAM: DG HIP (WITH OR WITHOUT PELVIS) 2-3V RIGHT  COMPARISON:  None.  FINDINGS: Pelvic ring is intact. Degenerative changes of the right hip joint are noted somewhat more marked than that seen on the left side. No acute fracture or dislocation is noted. No soft tissue abnormality is seen.  IMPRESSION: Degenerative change of the right hip.   Electronically Signed   By: [name] M.D.   On: 04/20/2017 15:43    Hip-L DG 2-3 views:  Results for orders placed during the hospital encounter of 04/20/17  DG HIP UNILAT W OR W/O PELVIS 2-3 VIEWS LEFT   Narrative CLINICAL DATA:  Hip pain, initial encounter  EXAM: DG HIP (WITH OR WITHOUT PELVIS) 3V LEFT  COMPARISON:   None.  FINDINGS: Degenerative changes of left hip joint are seen. There somewhat less marked than that seen on the right. No soft tissue abnormality is seen.  IMPRESSION: Degenerative change without acute abnormality.   Electronically Signed   By: [name] M.D.   On: 04/20/2017 15:44    Complexity Note: Imaging results reviewed. Results shared with Ms. Pangilinan, using Layman's terms.                         Meds   Current Outpatient Medications:  .  albuterol (PROVENTIL) (2.5 MG/3ML) 0.083% nebulizer solution, Take 3 mLs (2.5 mg total) by nebulization every 8 (eight) hours as needed. Shortness of breath and wheezing, Disp: 75 mL, Rfl: 12 .  apixaban (ELIQUIS) 5 MG TABS tablet, Take 1 tablet (5 mg total) by mouth 2 (two) times daily., Disp: 60 tablet, Rfl: 0 .  bisacodyl (DULCOLAX) 5 MG EC tablet, Take 2 tablets (10 mg total) by mouth daily as needed for moderate constipation., Disp: 60 tablet, Rfl: 2 .  Cholecalciferol (VITAMIN D) 2000 units tablet, Take 2,000 Units by mouth daily. , Disp: , Rfl:  .  furosemide (LASIX) 20 MG tablet, Take 0.5 tablets (10 mg total) by mouth daily., Disp: 45 tablet, Rfl: 1 .  levocetirizine (XYZAL) 5 MG tablet, Take 1 tablet (5 mg total) by mouth every evening., Disp: 30 tablet, Rfl: 12 .  lisinopril (PRINIVIL,ZESTRIL) 10 MG tablet, Take 5 mg by mouth daily., Disp: , Rfl:  .  Melatonin 10 MG CAPS, Take 20 mg by mouth at bedtime as needed., Disp: 60 capsule, Rfl: 2 .  [START ON 01/07/2018] Oxycodone HCl 20 MG TABS, Take 1 tablet (20 mg total) by mouth every 8 (eight) hours as needed., Disp: 90 tablet, Rfl: 0 .  Oxycodone HCl 20 MG TABS, Take 1 tablet (20 mg total) by mouth every 8 (eight) hours as needed., Disp: 90 tablet, Rfl: 0 .  OXYGEN, Inhale 2 L into the lungs at bedtime. , Disp: , Rfl:  .  polyethylene glycol (MIRALAX / GLYCOLAX) packet, Take 17 g by mouth daily as needed for moderate constipation., Disp: 14 each, Rfl: 2 .  rOPINIRole (REQUIP) 2  MG tablet, Take 2 tablets at bedtime, Disp: 60 tablet, Rfl: 2 .  Oxycodone HCl 20 MG TABS, Take 1 tablet (20 mg total) by mouth every 8 (eight) hours as needed. (Patient not taking: Reported on 12/06/2017), Disp:  90 tablet, Rfl: 0  ROS  Constitutional: Denies any fever or chills Gastrointestinal: No reported hemesis, hematochezia, vomiting, or acute GI distress Musculoskeletal: Denies any acute onset joint swelling, redness, loss of ROM, or weakness Neurological: No reported episodes of acute onset apraxia, aphasia, dysarthria, agnosia, amnesia, paralysis, loss of coordination, or loss of consciousness  Allergies  Ms. Thomann is allergic to flexeril [cyclobenzaprine hcl] and vancomycin.  PFSH  Drug: Ms. Pfister  reports that she does not use drugs. Alcohol:  reports that she does not drink alcohol. Tobacco:  reports that she quit smoking about 19 years ago. Her smoking use included cigarettes. She has a 50.00 pack-year smoking history. She has never used smokeless tobacco. Medical:  has a past medical history of Arrhythmia, sinus node (04/03/2014), Arthritis, Arthritis, Atrial fibrillation ([place]), Back pain, Bradycardia, Breast cancer ([place]) (2004), Cancer ([place]) (2004), CHF (congestive heart failure) ([place]), Chronic back pain, CKD (chronic kidney disease), COPD (chronic obstructive pulmonary disease) ([place]), Cystocele, Decubitus ulcers, Dehydration, Gout, Hematuria, Hepatitis C, Hyperlipidemia, Hypertension, Hypomagnesemia (06/25/2015), OAB (overactive bladder), Overactive bladder, Restless leg, Shoulder pain, bilateral, Urinary frequency, UTI (lower urinary tract infection), and Vaginal atrophy. Surgical: Ms. Tatum  has a past surgical history that includes Abdominal hysterectomy; Back surgery; Rotator cuff repair; Breast surgery; Cataract extraction w/PHACO (10/19/2011); Cataract extraction w/PHACO (11/02/2011); Carpal tunnel release; Joint replacement; Colon surgery; Pacemaker insertion (N/A, 12/24/2015);  Cholecystectomy; Breast lumpectomy (Right, 2004); and Insert / replace / remove pacemaker. Family: family history includes Breast cancer in her paternal aunt; Breast cancer (age of onset: 35) in her sister; Cancer in her sister; Heart disease in her father and mother; Kidney disease in her brother; Stroke in her mother.  Constitutional Exam  General appearance: Well nourished, well developed, and well hydrated. In no apparent acute distress Vitals:   12/26/17 0856  BP: (!) 138/92  Pulse: 77  Resp: 16  Temp: 98.2 F (36.8 C)  TempSrc: Oral  SpO2: 94%  Weight: 158 lb (71.7 kg)  Height: '5\' 9"'  (1.753 m)   BMI Assessment: Estimated body mass index is 23.33 kg/m as calculated from the following:   Height as of this encounter: '5\' 9"'  (1.753 m).   Weight as of this encounter: 158 lb (71.7 kg).  BMI interpretation table: BMI level Category Range association with higher incidence of chronic pain  <18 kg/m2 Underweight   18.5-24.9 kg/m2 Ideal body weight   25-29.9 kg/m2 Overweight Increased incidence by 20%  30-34.9 kg/m2 Obese (Class I) Increased incidence by 68%  35-39.9 kg/m2 Severe obesity (Class II) Increased incidence by 136%  >40 kg/m2 Extreme obesity (Class III) Increased incidence by 254%   Patient's current BMI Ideal Body weight  Body mass index is 23.33 kg/m. Ideal body weight: 66.2 kg (145 lb 15.1 oz) Adjusted ideal body weight: 68.4 kg (150 lb 12.3 oz)   BMI Readings from Last 4 Encounters:  12/26/17 23.33 kg/m  12/06/17 24.12 kg/m  11/01/17 24.02 kg/m  10/13/17 24.02 kg/m   Wt Readings from Last 4 Encounters:  12/26/17 158 lb (71.7 kg)  12/06/17 158 lb 9.6 oz (71.9 kg)  11/01/17 158 lb (71.7 kg)  10/13/17 158 lb (71.7 kg)  Psych/Mental status: Alert, oriented x 3 (person, place, & time)       Eyes: PERLA Respiratory: No evidence of acute respiratory distress  Cervical Spine Area Exam  Skin & Axial Inspection: No masses, redness, edema, swelling, or associated  skin lesions Alignment: Symmetrical Functional ROM: Unrestricted ROM  Stability: No instability detected Muscle Tone/Strength: Functionally intact. No obvious neuro-muscular anomalies detected. Sensory (Neurological): Unimpaired Palpation: No palpable anomalies              Upper Extremity (UE) Exam    Side: Right upper extremity  Side: Left upper extremity  Skin & Extremity Inspection: Evidence of prior arthroplastic surgery  Skin & Extremity Inspection: Skin color, temperature, and hair growth are WNL. No peripheral edema or cyanosis. No masses, redness, swelling, asymmetry, or associated skin lesions. No contractures.  Functional ROM: Decreased ROM for shoulder  Functional ROM: Unrestricted ROM          Muscle Tone/Strength: Guarding  Muscle Tone/Strength: Functionally intact. No obvious neuro-muscular anomalies detected.  Sensory (Neurological): Arthropathic arthralgia          Sensory (Neurological): Unimpaired          Palpation: Complains of area being tender to palpation              Palpation: No palpable anomalies              Specialized Test(s): Deferred         Specialized Test(s): Deferred          Thoracic Spine Area Exam  Skin & Axial Inspection: No masses, redness, or swelling Alignment: Symmetrical Functional ROM: Unrestricted ROM Stability: No instability detected Muscle Tone/Strength: Functionally intact. No obvious neuro-muscular anomalies detected. Sensory (Neurological): Unimpaired Muscle strength & Tone: No palpable anomalies  Lumbar Spine Area Exam  Skin & Axial Inspection: No masses, redness, or swelling Alignment: Symmetrical Functional ROM: Decreased ROM       Stability: No instability detected Muscle Tone/Strength: Functionally intact. No obvious neuro-muscular anomalies detected. Sensory (Neurological): Movement-associated pain Palpation: Complains of area being tender to palpation       Provocative Tests: Lumbar Hyperextension and rotation test:  Positive bilaterally for facet joint pain. Lumbar Lateral bending test: evaluation deferred today       Patrick's Maneuver: evaluation deferred today                    Gait & Posture Assessment  Ambulation: Patient ambulates using a cane Gait: Significantly limited. Dependent on assistive device to ambulate Posture: Difficulty standing up straight, due to pain   Lower Extremity Exam    Side: Right lower extremity  Side: Left lower extremity  Stability: No instability observed          Stability: No instability observed          Skin & Extremity Inspection: Skin color, temperature, and hair growth are WNL. No peripheral edema or cyanosis. No masses, redness, swelling, asymmetry, or associated skin lesions. No contractures.  Skin & Extremity Inspection: Skin color, temperature, and hair growth are WNL. No peripheral edema or cyanosis. No masses, redness, swelling, asymmetry, or associated skin lesions. No contractures.  Functional ROM: Unrestricted ROM                  Functional ROM: Unrestricted ROM                  Muscle Tone/Strength: Functionally intact. No obvious neuro-muscular anomalies detected.  Muscle Tone/Strength: Functionally intact. No obvious neuro-muscular anomalies detected.  Sensory (Neurological): Unimpaired  Sensory (Neurological): Unimpaired  Palpation: No palpable anomalies  Palpation: No palpable anomalies   Assessment  Primary Diagnosis & Pertinent Problem List: The primary encounter diagnosis was Chronic low back pain (Primary Source of Pain) (Bilateral) (L>R). 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  Status  Diagnosis  Controlled Controlled Controlled 1. Chronic low back pain (Primary Source of Pain) (Bilateral) (L>R)   2. Chronic lower extremity pain (Secondary Area of Pain) (Bilateral) (L>R)   3. Chronic cervical radicular pain (Tertiary Area of Pain) (Bilateral) (L>R)   4. 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 Chronic pain syndrome   6. Opiate use (90 MME/Day)   7. Chronic shoulder pain (S/P replacement) (Right)   8. Shoulder pain S/P prostheses (Right)     Problems updated and reviewed during this visit: No problems updated. Plan of Care  Pharmacotherapy (Medications Ordered): No orders of the defined types were placed in this encounter.  Medications administered today: [name] had no medications administered during this visit.   Procedure Orders     Radiofrequency Shoulder Joint Lab Orders  No laboratory test(s) ordered today   Imaging Orders  No imaging studies ordered today   Referral Orders  No referral(s) requested today    Interventional management options: Planned, scheduled, and/or pending:   Schedule her for right suprascapular RFA, ASAP, under fluoro and IV sedation.   Considering:   Diagnostic bilateral sacroiliac joint block Possible bilateral sacroiliac joint RFA Diagnostic bilateral intra-articular hip joint injection Diagnostic bilateral femoral nerve + obturator nerve block Possible bilateral femoral nerve + obturator nerve RFA Possible bilateral lumbar facet radiofrequency. (Left side done on 12/13/2016) Palliative bilateral lumbar facet block  Diagnostic right intra-articular shoulder joint injection Diagnostic right suprascapular nerve block Possible right suprascapular radiofrequencyablation.    Palliative PRN treatment(s):   Palliative bilateral lumbar facet block Diagnostic right intra-articular shoulder joint injection  Diagnostic right suprascapular nerve block Possible right  suprascapular radiofrequencyablation.   Provider-requested follow-up: Return for RFA (fluoro + sedation): (R) Suprascapular RFA.  Future Appointments  Date Time Provider [place]  01/18/2018 10:20 AM Mikey College, NP [facility] None  02/01/2018 11:00 AM Vevelyn Francois, NP [facility] None  03/27/2018 11:00 AM Mikey College, NP [facility] None   Primary Care Physician: Mikey College, NP Location: [facility] Outpatient Pain Management Facility Note by: Gaspar Cola, MD Date: 12/26/2017; Time: 9:44 AM

## 2018-01-18 ENCOUNTER — Other Ambulatory Visit: Payer: Self-pay

## 2018-01-18 ENCOUNTER — Encounter: Payer: Self-pay | Admitting: Nurse Practitioner

## 2018-01-18 ENCOUNTER — Ambulatory Visit (INDEPENDENT_AMBULATORY_CARE_PROVIDER_SITE_OTHER): Payer: Medicare Other | Admitting: Nurse Practitioner

## 2018-01-18 VITALS — BP 109/63 | HR 70 | Temp 99.4°F | Ht 69.0 in | Wt 160.8 lb

## 2018-01-18 DIAGNOSIS — Z23 Encounter for immunization: Secondary | ICD-10-CM | POA: Diagnosis not present

## 2018-01-18 DIAGNOSIS — I10 Essential (primary) hypertension: Secondary | ICD-10-CM

## 2018-01-18 DIAGNOSIS — D649 Anemia, unspecified: Secondary | ICD-10-CM | POA: Diagnosis not present

## 2018-01-18 DIAGNOSIS — I5032 Chronic diastolic (congestive) heart failure: Secondary | ICD-10-CM

## 2018-01-18 DIAGNOSIS — J431 Panlobular emphysema: Secondary | ICD-10-CM

## 2018-01-18 MED ORDER — LISINOPRIL 10 MG PO TABS: 10.0000 mg | ORAL_TABLET | Freq: Every day | ORAL | 1 refills | Status: DC

## 2018-01-18 NOTE — Progress Notes (Signed)
Subjective:    [name], female    DOB: Dec 07, 1929, 84 y.o.   MRN: [id number]  Kristin [name] is a 84 y.o. female presenting on 01/18/2018 for Hypertension   HPI Hypertension - She is checking BP at home or outside of clinic.  Readings 96/50 - 130/80 - Current medications: lisinopril 5 mg and furosemide 10 mg once daily, tolerating well without side effects  - Had previously had dehydration and had AKI now with full recovery. She drinks between 48-64 oz water every day. - She is not currently symptomatic. - Pt denies headache, lightheadedness, dizziness, changes in vision, chest tightness/pressure, palpitations, leg swelling, sudden loss of speech or loss of consciousness. - She has no regular exercise, but is taking care of her own housework.  She tries to walk regularly, but has done so less often with cold weather. - Her diet is low in salt, moderate in fat, and moderate in carbohydrates.  COPD Some shortness of breath, but relieves with rest.  Takes albuterol as needed.  Temperatures outdoors in the last 2 to 3 days have been higher than 90 degrees.  Patient reports no increased shortness of breath.  Generally, patient stays indoors to avoid hot temperatures.  Patient continues to walk as tolerated, but has increased shortness of breath with short distances.  Patient also has CHF, which complicates breathing.  CHF Pt reports stable weights.  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  She states she feels very well and has no concerns today.  Social History   Tobacco Use  . Smoking status: Former Smoker    Packs/day: 1.25    Years: 40.00    Pack years: 50.00    Types: Cigarettes    Last attempt to quit: 12/16/1998    Years since quitting: 19.1  . Smokeless tobacco: Never Used  Substance Use Topics  . Alcohol use: No    Alcohol/week: 0.0 oz  . Drug use: No    Review of Systems Per  HPI unless specifically indicated above     Objective:    BP 109/63 (BP Location: Right Arm, Patient Position: Sitting, Cuff Size: Normal)   Pulse 70   Temp 99.4 F (37.4 C) (Oral)   Ht 5\' 9"  (1.753 m)   Wt 160 lb 12.8 oz (72.9 kg)   BMI 23.75 kg/m   Wt Readings from Last 3 Encounters:  01/18/18 160 lb 12.8 oz (72.9 kg)  12/26/17 158 lb (71.7 kg)  12/06/17 158 lb 9.6 oz (71.9 kg)    Physical Exam  Constitutional: She is oriented to person, place, and time. She appears well-developed and well-nourished. No distress.  HENT:  Head: Normocephalic and atraumatic.  Neck: Normal range of motion. Neck supple. Carotid bruit is not present.  Cardiovascular: Normal rate, regular rhythm, S1 normal, S2 normal, normal heart sounds and intact distal pulses.  Pulmonary/Chest: Effort normal and breath sounds normal. No respiratory distress. She has no wheezes. She has no rales.  Abdominal: Soft. Bowel sounds are normal. She exhibits no distension.  Musculoskeletal: She exhibits no edema (pedal).  Neurological: She is alert and oriented to person, place, and time.  Skin: Skin is warm and dry.  Psychiatric: She has a normal mood and affect. Her behavior is normal. Judgment and thought content normal.  Vitals reviewed.   Results for orders placed or performed during the hospital encounter of 08/22/17  Comprehensive  metabolic panel  Result Value Ref Range   Sodium 139 135 - 145 mmol/L   Potassium 4.9 3.5 - 5.1 mmol/L   Chloride 107 101 - 111 mmol/L   CO2 25 22 - 32 mmol/L   Glucose, Bld 73 65 - 99 mg/dL   BUN 19 6 - 20 mg/dL   Creatinine, Ser 1.09 (H) 0.44 - 1.00 mg/dL   Calcium 9.7 8.9 - 10.3 mg/dL   Total Protein 6.6 6.5 - 8.1 g/dL   Albumin 3.6 3.5 - 5.0 g/dL   AST 23 15 - 41 U/L   ALT 11 (L) 14 - 54 U/L   Alkaline Phosphatase 60 38 - 126 U/L   Total Bilirubin 0.5 0.3 - 1.2 mg/dL   GFR calc non Af Amer 45 (L) >60 mL/min   GFR calc Af Amer 52 (L) >60 mL/min   Anion gap 7 5 - 15       Assessment & Plan:   Problem List Items Addressed This Visit      Cardiovascular and Mediastinum   Benign essential HTN - Primary   Relevant Medications   lisinopril (PRINIVIL,ZESTRIL) 10 MG tablet   Other Relevant Orders   COMPLETE METABOLIC PANEL WITH GFR   Chronic diastolic CHF (congestive heart failure) (HCC)   Relevant Medications   lisinopril (PRINIVIL,ZESTRIL) 10 MG tablet     Respiratory   Chronic obstructive pulmonary disease (HCC)    Other Visit Diagnoses    Anemia, unspecified type       Relevant Orders   CBC with Differential/Platelet   Encounter for vaccination       Relevant Orders   Pneumococcal polysaccharide vaccine 23-valent greater than or equal to 2yo subcutaneous/IM (Completed)    #1 Hypertension: Stable today on exam.  Medications tolerated without side effects.  Continue at current doses.  Refills provided.  Check labs today. Followup 6 months.  #2 CHF: Stable today on exam.  Medications tolerated without side effects.  Continue at current doses.  Refills provided.  Check labs today. Followup 6 months.    #3 COPD: Stable today on exam.  Medications : only on rescue inhaler.  Consider adding maintenance if needed this summer.  Continue at current doses.  Refills provided.  Followup 6 months and prn.   #4 Anemia: Status unknown.  Recheck labs.  Followup prn after labs.   #5 Vaccine: pt due for every 5 year PPSV-23 for indication COPD/CHF.  Pt agrees to have administered today.  Follow up plan: Return in about 6 months (around 07/21/2018) for hypertension, COPD.  [name], DNP, AGPCNP-BC Adult Gerontology Primary Care Nurse Practitioner [facility] 01/20/2018, 10:56 AM

## 2018-01-18 NOTE — Patient Instructions (Addendum)
[name],   Thank you for coming in to clinic today.  1. Continue medications without changes today.  Keep lisinopril at 10 mg once daily without changes.  2. Call if you start getting short of breath with warmer weather this summer.  You may need a daily inhaler.  Please schedule a follow-up appointment with [name], AGNP. Return in about 6 months (around 07/21/2018) for hypertension, COPD.  Call if needing an appointment sooner.  If you have any other questions or concerns, please feel free to call the clinic or send a message through [facility]. You may also schedule an earlier appointment if necessary.  You will receive a survey after today's visit either digitally by e-mail or paper by [employer]. Your experiences and feedback matter to [name].  Please respond so we know how we are doing as we provide care for you.   [name], DNP, AGNP-BC Adult Gerontology Nurse Practitioner [place]

## 2018-01-20 ENCOUNTER — Encounter: Payer: Self-pay | Admitting: Nurse Practitioner

## 2018-02-01 ENCOUNTER — Ambulatory Visit: Payer: Medicare Other | Attending: Nurse Practitioner | Admitting: Nurse Practitioner

## 2018-02-01 ENCOUNTER — Encounter: Payer: Self-pay | Admitting: Nurse Practitioner

## 2018-02-01 ENCOUNTER — Other Ambulatory Visit: Payer: Self-pay

## 2018-02-01 VITALS — BP 128/74 | Temp 98.0°F | Resp 63 | Ht 68.0 in | Wt 157.0 lb

## 2018-02-01 DIAGNOSIS — Z950 Presence of cardiac pacemaker: Secondary | ICD-10-CM | POA: Diagnosis not present

## 2018-02-01 DIAGNOSIS — I5032 Chronic diastolic (congestive) heart failure: Secondary | ICD-10-CM | POA: Diagnosis not present

## 2018-02-01 DIAGNOSIS — I341 Nonrheumatic mitral (valve) prolapse: Secondary | ICD-10-CM | POA: Diagnosis not present

## 2018-02-01 DIAGNOSIS — Z888 Allergy status to other drugs, medicaments and biological substances status: Secondary | ICD-10-CM | POA: Insufficient documentation

## 2018-02-01 DIAGNOSIS — I4892 Unspecified atrial flutter: Secondary | ICD-10-CM | POA: Diagnosis not present

## 2018-02-01 DIAGNOSIS — M79605 Pain in left leg: Secondary | ICD-10-CM | POA: Insufficient documentation

## 2018-02-01 DIAGNOSIS — M5137 Other intervertebral disc degeneration, lumbosacral region: Secondary | ICD-10-CM | POA: Insufficient documentation

## 2018-02-01 DIAGNOSIS — E785 Hyperlipidemia, unspecified: Secondary | ICD-10-CM | POA: Diagnosis not present

## 2018-02-01 DIAGNOSIS — G4700 Insomnia, unspecified: Secondary | ICD-10-CM | POA: Insufficient documentation

## 2018-02-01 DIAGNOSIS — M79604 Pain in right leg: Secondary | ICD-10-CM | POA: Insufficient documentation

## 2018-02-01 DIAGNOSIS — E1122 Type 2 diabetes mellitus with diabetic chronic kidney disease: Secondary | ICD-10-CM | POA: Insufficient documentation

## 2018-02-01 DIAGNOSIS — J449 Chronic obstructive pulmonary disease, unspecified: Secondary | ICD-10-CM | POA: Diagnosis not present

## 2018-02-01 DIAGNOSIS — Z87891 Personal history of nicotine dependence: Secondary | ICD-10-CM | POA: Insufficient documentation

## 2018-02-01 DIAGNOSIS — K5903 Drug induced constipation: Secondary | ICD-10-CM | POA: Diagnosis not present

## 2018-02-01 DIAGNOSIS — G2581 Restless legs syndrome: Secondary | ICD-10-CM | POA: Insufficient documentation

## 2018-02-01 DIAGNOSIS — Z7901 Long term (current) use of anticoagulants: Secondary | ICD-10-CM | POA: Insufficient documentation

## 2018-02-01 DIAGNOSIS — T402X5A Adverse effect of other opioids, initial encounter: Secondary | ICD-10-CM | POA: Diagnosis not present

## 2018-02-01 DIAGNOSIS — G894 Chronic pain syndrome: Secondary | ICD-10-CM | POA: Diagnosis not present

## 2018-02-01 DIAGNOSIS — M5030 Other cervical disc degeneration, unspecified cervical region: Secondary | ICD-10-CM

## 2018-02-01 DIAGNOSIS — G8929 Other chronic pain: Secondary | ICD-10-CM | POA: Diagnosis not present

## 2018-02-01 DIAGNOSIS — M533 Sacrococcygeal disorders, not elsewhere classified: Secondary | ICD-10-CM | POA: Diagnosis not present

## 2018-02-01 DIAGNOSIS — Z8249 Family history of ischemic heart disease and other diseases of the circulatory system: Secondary | ICD-10-CM | POA: Insufficient documentation

## 2018-02-01 DIAGNOSIS — Z79891 Long term (current) use of opiate analgesic: Secondary | ICD-10-CM | POA: Diagnosis not present

## 2018-02-01 DIAGNOSIS — Z9889 Other specified postprocedural states: Secondary | ICD-10-CM | POA: Insufficient documentation

## 2018-02-01 DIAGNOSIS — N183 Chronic kidney disease, stage 3 (moderate): Secondary | ICD-10-CM | POA: Diagnosis not present

## 2018-02-01 DIAGNOSIS — M25511 Pain in right shoulder: Secondary | ICD-10-CM | POA: Diagnosis not present

## 2018-02-01 DIAGNOSIS — Z9049 Acquired absence of other specified parts of digestive tract: Secondary | ICD-10-CM | POA: Insufficient documentation

## 2018-02-01 DIAGNOSIS — Z96611 Presence of right artificial shoulder joint: Secondary | ICD-10-CM | POA: Diagnosis not present

## 2018-02-01 DIAGNOSIS — Z853 Personal history of malignant neoplasm of breast: Secondary | ICD-10-CM | POA: Diagnosis not present

## 2018-02-01 DIAGNOSIS — K219 Gastro-esophageal reflux disease without esophagitis: Secondary | ICD-10-CM | POA: Diagnosis not present

## 2018-02-01 DIAGNOSIS — Z79899 Other long term (current) drug therapy: Secondary | ICD-10-CM | POA: Insufficient documentation

## 2018-02-01 DIAGNOSIS — I4891 Unspecified atrial fibrillation: Secondary | ICD-10-CM | POA: Insufficient documentation

## 2018-02-01 DIAGNOSIS — M48061 Spinal stenosis, lumbar region without neurogenic claudication: Secondary | ICD-10-CM | POA: Diagnosis not present

## 2018-02-01 DIAGNOSIS — T8484XA Pain due to internal orthopedic prosthetic devices, implants and grafts, initial encounter: Secondary | ICD-10-CM | POA: Insufficient documentation

## 2018-02-01 DIAGNOSIS — M47896 Other spondylosis, lumbar region: Secondary | ICD-10-CM | POA: Insufficient documentation

## 2018-02-01 DIAGNOSIS — I130 Hypertensive heart and chronic kidney disease with heart failure and stage 1 through stage 4 chronic kidney disease, or unspecified chronic kidney disease: Secondary | ICD-10-CM | POA: Insufficient documentation

## 2018-02-01 DIAGNOSIS — Z823 Family history of stroke: Secondary | ICD-10-CM | POA: Insufficient documentation

## 2018-02-01 DIAGNOSIS — M810 Age-related osteoporosis without current pathological fracture: Secondary | ICD-10-CM | POA: Diagnosis not present

## 2018-02-01 DIAGNOSIS — Z841 Family history of disorders of kidney and ureter: Secondary | ICD-10-CM | POA: Insufficient documentation

## 2018-02-01 DIAGNOSIS — M47816 Spondylosis without myelopathy or radiculopathy, lumbar region: Secondary | ICD-10-CM

## 2018-02-01 DIAGNOSIS — Z803 Family history of malignant neoplasm of breast: Secondary | ICD-10-CM | POA: Insufficient documentation

## 2018-02-01 DIAGNOSIS — Z90710 Acquired absence of both cervix and uterus: Secondary | ICD-10-CM | POA: Insufficient documentation

## 2018-02-01 MED ORDER — BISACODYL 5 MG PO TBEC: 10.0000 mg | DELAYED_RELEASE_TABLET | Freq: Every day | ORAL | 2 refills | Status: DC | PRN

## 2018-02-01 MED ORDER — OXYCODONE HCL 20 MG PO TABS: 20.0000 mg | ORAL_TABLET | Freq: Three times a day (TID) | ORAL | 0 refills | Status: DC | PRN

## 2018-02-01 MED ORDER — POLYETHYLENE GLYCOL 3350 17 G PO PACK: 17.0000 g | PACK | Freq: Every day | ORAL | 2 refills | Status: DC | PRN

## 2018-02-01 MED ORDER — ROPINIROLE HCL 2 MG PO TABS: ORAL_TABLET | ORAL | 2 refills | Status: DC

## 2018-02-01 MED ORDER — MELATONIN 10 MG PO CAPS: 20.0000 mg | ORAL_CAPSULE | Freq: Every evening | ORAL | 2 refills | Status: DC | PRN

## 2018-02-01 NOTE — Progress Notes (Addendum)
Patient's Name: Kristin [name]  MRN: [id number]  Referring Provider: [facility]  DOB: [birth year]  PCP: Mikey College, NP  DOS: 02/01/2018  Note by: Vevelyn Francois NP  Service setting: Ambulatory outpatient  Specialty: Interventional Pain Management  Location: [facility] (AMB) Pain Management Facility    Patient type: Established    Primary Reason(s) for Visit: Encounter for prescription drug management. (Level of risk: moderate)  CC: Back Pain (lower) and Shoulder Pain (bilaterally)  HPI  Kristin [name] is a 84 y.o. year old, female patient, who comes today for a medication management evaluation. She has Chronic kidney disease (CKD), stage III (moderate) (HCC); DDD (degenerative disc disease), lumbosacral; Gout; Benign essential HTN; Lumbar central spinal stenosis; Chronic obstructive pulmonary disease ([place]); Chemical diabetes; History of cardiac catheterization; Seasonal allergies; History of breast cancer; Chronic pain syndrome; Chronic low back pain (Primary Source of Pain) (Bilateral) (L>R); Lumbar facet syndrome (Bilateral) (L>R); Lumbar spondylosis; Chronic lumbar radicular pain (Bilateral) (L>R) (L5 Dermatome); Restless leg syndrome; Myofascial pain; Neurogenic pain; Long term current use of opiate analgesic; Long term prescription opiate use; Opiate use (90 MME/Day); Opioid-induced constipation (OIC); Vitamin D insufficiency; Grade 1 Retrolisthesis of L1 over L2; Lumbar facet hypertrophy (L1-2, L2-3, and L4-5); Lumbar lateral recess stenosis (L1-2 and L4-5); 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); Epidural fibrosis; Lumbar foraminal stenosis (Severe Left L4-5); 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); Osteoporosis; Chronic sacroiliac joint pain (Left); History of methicillin resistant staphylococcus aureus (MRSA); Mitral valve prolapse; GERD (gastroesophageal reflux disease); Chronic shoulder pain (S/P replacement) (Right); Osteoarthritis, multiple sites;  Shoulder pain S/P prostheses (Right); Chronic diastolic CHF (congestive heart failure) ([place]); S/P cardiac pacemaker procedure; Anticoagulated; Hyperlipidemia; Chronic neck pain; Chronic hip pain (Bilateral); Chronic cervical radicular pain (Tertiary Area of Pain) (Bilateral) (L>R); Long term current use of anticoagulant; Chronic lower extremity pain (Secondary Area of Pain) (Bilateral) (L>R); DDD (degenerative disc disease), cervical; Numbness and tingling of upper extremity (Bilateral); Radicular pain of shoulder (Bilateral) (L>R); Cervical facet syndrome (Bilateral) (L>R); Cervical (3-4 mm) Anterolisthesis of C4 on C5; Cervical foraminal stenosis (Bilateral); Atrial fibrillation and flutter ([place]); and Insomnia on their problem list. Her primarily concern today is the Back Pain (lower) and Shoulder Pain (bilaterally)  Pain Assessment: Location: Lower Back Radiating: denies Onset: More than a month ago Duration: Chronic pain Quality: Constant, Aching Severity: 5 /10 (subjective, self-reported pain score)  Note: Reported level is compatible with observation.                          Effect on ADL: limited ROM in right arm Timing: Constant Modifying factors: medications help a little bit BP: 128/74  HR:    Kristin [name] was last scheduled for an appointment on 11/01/2017 for medication management. During today's appointment we reviewed Kristin [name]'s chronic pain status, as well as her outpatient medication regimen. She admits that she continues to have the shoulder pain. She is scheduled to have a suprascapular nerve block. She denies any additional concerns today. She admits that she is sleeping with the Melatonin and would like to continue this.   The patient  reports that she does not use drugs. Her body mass index is 23.87 kg/m.  Further details on both, my assessment(s), as well as the proposed treatment plan, please see below.  Controlled Substance Pharmacotherapy Assessment REMS (Risk  Evaluation and Mitigation Strategy)  Analgesic:Oxycodone IR 20 mg every 8 hours (60 mg/day) MME/day:90 mg/day    [name],  [name], RN  02/01/2018  4:02 PM  Signed Nursing Pain Medication Assessment:  Safety precautions to be maintained throughout the outpatient stay will include: orient to surroundings, keep bed in low position, maintain call bell within reach at all times, provide assistance with transfer out of bed and ambulation.  Medication Inspection Compliance: Pill count conducted under aseptic conditions, in front of the patient. Neither the pills nor the bottle was removed from the patient's sight at any time. Once count was completed pills were immediately returned to the patient in their original bottle.  Medication: oxycodone HCL 20 mg Pill/Patch Count: 4 of 90 pills remain Pill/Patch Appearance: Markings consistent with prescribed medication Bottle Appearance: Standard pharmacy container. Clearly labeled. Filled Date: 5 / 52 / 2019 Last Medication intake:  Today   Pharmacokinetics: Liberation and absorption (onset of action): WNL Distribution (time to peak effect): WNL Metabolism and excretion (duration of action): WNL         Pharmacodynamics: Desired effects: Analgesia: Kristin [name] reports >50% benefit. Functional ability: Patient reports that medication allows her to accomplish basic ADLs Clinically meaningful improvement in function (CMIF): Sustained CMIF goals met Perceived effectiveness: Described as relatively effective, allowing for increase in activities of daily living (ADL) Undesirable effects: Side-effects or Adverse reactions: None reported Monitoring: [place] PMP: Online review of the past 65-monthperiod conducted. Compliant with practice rules and regulations Last UDS on record: Summary  Date Value Ref Range Status  03/01/2017 FINAL  Final    Comment:    ==================================================================== TOXASSURE SELECT 13  (MW) ==================================================================== Test                             Result       Flag       Units Drug Present and Declared for Prescription Verification   Oxycodone                      4800         EXPECTED   ng/mg creat   Oxymorphone                    1386         EXPECTED   ng/mg creat   Noroxycodone                   5976         EXPECTED   ng/mg creat   Noroxymorphone                 572          EXPECTED   ng/mg creat    Sources of oxycodone are scheduled prescription medications.    Oxymorphone, noroxycodone, and noroxymorphone are expected    metabolites of oxycodone. Oxymorphone is also available as a    scheduled prescription medication. ==================================================================== Test                      Result    Flag   Units      Ref Range   Creatinine              29               mg/dL      >=20 ==================================================================== Declared Medications:  The flagging and interpretation on this report are based on the  following declared medications.  Unexpected results may arise from  inaccuracies  in the declared medications.  **Note: The testing scope of this panel includes these medications:  Oxycodone  **Note: The testing scope of this panel does not include following  reported medications:  Albuterol  Apixaban  Aspirin  Bisacodyl  Cholecalciferol  Colchicine  Furosemide  Levocetirizine  Lisinopril  Oxygen  Polyethylene Glycol  Ropinirole ==================================================================== For clinical consultation, please call [phone]. ====================================================================    UDS interpretation: Compliant          Medication Assessment Form: Reviewed. Patient indicates being compliant with therapy Treatment compliance: Deficiencies noted and steps taken to remind the patient of the seriousness of  adequate therapy compliance Risk Assessment Profile: Aberrant behavior: See prior evaluations. None observed or detected today Comorbid factors increasing risk of overdose: See prior notes. No additional risks detected today Risk of substance use disorder (SUD): Moderate Opioid Risk Tool - 02/01/18 1136      Family History of Substance Abuse   Alcohol  Negative    Illegal Drugs  Negative    Rx Drugs  Negative      Personal History of Substance Abuse   Alcohol  Negative    Illegal Drugs  Negative    Rx Drugs  Negative      Age   Age between 28-45 years   No      Psychological Disease   Psychological Disease  Negative    Depression  Negative      Total Score   Opioid Risk Tool Scoring  0    Opioid Risk Interpretation  Low Risk      ORT Scoring interpretation table:  Score <3 = Low Risk for SUD  Score between 4-7 = Moderate Risk for SUD  Score >8 = High Risk for Opioid Abuse   Risk Mitigation Strategies:  Patient Counseling: Covered Patient-Prescriber Agreement (PPA): Present and active  Notification to other healthcare providers: Done  Pharmacologic Plan: No change in therapy, at this time.             Laboratory Chemistry  Inflammation Markers (CRP: Acute Phase) (ESR: Chronic Phase) Lab Results  Component Value Date   CRP <0.5 09/24/2015   ESRSEDRATE 12 09/24/2015                         Rheumatology Markers No results found for: RF, ANA, LABURIC, URICUR, LYMEIGGIGMAB, LYMEABIGMQN, HLAB27                      Renal Function Markers Lab Results  Component Value Date   BUN 19 08/22/2017   CREATININE 1.09 (H) 08/22/2017   GFRAA 52 (L) 08/22/2017   GFRNONAA 45 (L) 08/22/2017                             Hepatic Function Markers Lab Results  Component Value Date   AST 23 08/22/2017   ALT 11 (L) 08/22/2017   ALBUMIN 3.6 08/22/2017   ALKPHOS 60 08/22/2017                        Electrolytes Lab Results  Component Value Date   NA 139 08/22/2017   K 4.9  08/22/2017   CL 107 08/22/2017   CALCIUM 9.7 08/22/2017   MG 2.2 09/24/2015                        Neuropathy  Markers No results found for: VITAMINB12, FOLATE, HGBA1C, HIV                      Bone Pathology Markers No results found for: VD25OH, H139778, [id number], [id number], 25OHVITD1, 25OHVITD2, 25OHVITD3, TESTOFREE, TESTOSTERONE                       Coagulation Parameters Lab Results  Component Value Date   INR 1.24 12/16/2015   LABPROT 15.8 (H) 12/16/2015   APTT 26 12/16/2015   PLT 139 (L) 08/15/2017                        Cardiovascular Markers Lab Results  Component Value Date   TROPONINI <0.03 08/14/2017   HGB 11.6 (L) 08/15/2017   HCT 34.8 (L) 08/15/2017                         CA Markers Lab Results  Component Value Date   LABCA2 49.4 (H) 07/10/2013                        Note: Lab results reviewed.  Recent Diagnostic Imaging Results  DG Chest 2 View CLINICAL DATA:  Respiratory tract infection.  EXAM: CHEST - 2 VIEW  COMPARISON:  12/24/2015.  10/28/2015.  CT 09/07/2014.  FINDINGS: Cardiac pacer with lead tips over the right atrium right ventricle. Stable cardiomegaly. Stable bilateral interstitial prominence. No pleural effusion or pneumothorax. Thoracolumbar spine scoliosis and degenerative change. Prior bilateral shoulder replacements. Surgical clips upper abdomen.  IMPRESSION: 1. Chronic bilateral interstitial prominence consistent chronic interstitial lung disease. No acute pulmonary disease.  2. Cardiac pacer noted is in stable position. Stable cardiomegaly. No pulmonary venous congestion.  Electronically Signed   By: Marcello Moores  Register   On: 12/06/2017 11:36  Complexity Note: Imaging results reviewed. Results shared with Ms. Spatz, using Layman's terms.                         Meds   Current Outpatient Medications:  .  albuterol (PROVENTIL) (2.5 MG/3ML) 0.083% nebulizer solution, Take 3 mLs (2.5 mg total) by nebulization every 8  (eight) hours as needed. Shortness of breath and wheezing, Disp: 75 mL, Rfl: 12 .  apixaban (ELIQUIS) 5 MG TABS tablet, Take 1 tablet (5 mg total) by mouth 2 (two) times daily., Disp: 60 tablet, Rfl: 0 .  bisacodyl (DULCOLAX) 5 MG EC tablet, Take 2 tablets (10 mg total) by mouth daily as needed for moderate constipation., Disp: 60 tablet, Rfl: 2 .  Cholecalciferol (VITAMIN D) 2000 units tablet, Take 2,000 Units by mouth daily. , Disp: , Rfl:  .  furosemide (LASIX) 20 MG tablet, Take 0.5 tablets (10 mg total) by mouth daily., Disp: 45 tablet, Rfl: 1 .  lisinopril (PRINIVIL,ZESTRIL) 10 MG tablet, Take 1 tablet (10 mg total) by mouth daily., Disp: 90 tablet, Rfl: 1 .  Melatonin 10 MG CAPS, Take 20 mg by mouth at bedtime as needed., Disp: 60 capsule, Rfl: 2 .  [START ON 04/07/2018] Oxycodone HCl 20 MG TABS, Take 1 tablet (20 mg total) by mouth every 8 (eight) hours as needed., Disp: 90 tablet, Rfl: 0 .  OXYGEN, Inhale 2 L into the lungs at bedtime. , Disp: , Rfl:  .  polyethylene glycol (MIRALAX / GLYCOLAX) packet, Take 17 g by mouth daily as needed for moderate  constipation., Disp: 14 each, Rfl: 2 .  rOPINIRole (REQUIP) 2 MG tablet, Take 2 tablets at bedtime, Disp: 60 tablet, Rfl: 2 .  [START ON 03/08/2018] Oxycodone HCl 20 MG TABS, Take 1 tablet (20 mg total) by mouth every 8 (eight) hours as needed., Disp: 90 tablet, Rfl: 0 .  [START ON 02/06/2018] Oxycodone HCl 20 MG TABS, Take 1 tablet (20 mg total) by mouth every 8 (eight) hours as needed., Disp: 90 tablet, Rfl: 0  ROS  Constitutional: Denies any fever or chills Gastrointestinal: No reported hemesis, hematochezia, vomiting, or acute GI distress Musculoskeletal: Denies any acute onset joint swelling, redness, loss of ROM, or weakness Neurological: No reported episodes of acute onset apraxia, aphasia, dysarthria, agnosia, amnesia, paralysis, loss of coordination, or loss of consciousness  Allergies  Ms. Rendell is allergic to flexeril  [cyclobenzaprine hcl] and vancomycin.  PFSH  Drug: Ms. Brindle  reports that she does not use drugs. Alcohol:  reports that she does not drink alcohol. Tobacco:  reports that she quit smoking about 19 years ago. Her smoking use included cigarettes. She has a 50.00 pack-year smoking history. She has never used smokeless tobacco. Medical:  has a past medical history of Arrhythmia, sinus node (04/03/2014), Arthritis, Arthritis, Atrial fibrillation ([place]), Back pain, Bradycardia, Breast cancer ([place]) (2004), Cancer ([place]) (2004), CHF (congestive heart failure) ([place]), Chronic back pain, CKD (chronic kidney disease), COPD (chronic obstructive pulmonary disease) ([place]), Cystocele, Decubitus ulcers, Dehydration, Gout, Hematuria, Hepatitis C, Hyperlipidemia, Hypertension, Hypomagnesemia (06/25/2015), OAB (overactive bladder), Overactive bladder, Restless leg, Shoulder pain, bilateral, Urinary frequency, UTI (lower urinary tract infection), and Vaginal atrophy. Surgical: Ms. Haran  has a past surgical history that includes Abdominal hysterectomy; Back surgery; Rotator cuff repair; Breast surgery; Cataract extraction w/PHACO (10/19/2011); Cataract extraction w/PHACO (11/02/2011); Carpal tunnel release; Joint replacement; Colon surgery; Pacemaker insertion (N/A, 12/24/2015); Cholecystectomy; Breast lumpectomy (Right, 2004); and Insert / replace / remove pacemaker. Family: family history includes Breast cancer in her paternal aunt; Breast cancer (age of onset: 75) in her sister; Cancer in her sister; Heart disease in her father and mother; Kidney disease in her brother; Stroke in her mother.  Constitutional Exam  General appearance: Well nourished, well developed, and well hydrated. In no apparent acute distress Vitals:   02/01/18 1104  BP: 128/74  Resp: (!) 63  Temp: 98 F (36.7 C)  TempSrc: Oral  SpO2: 97%  Weight: 157 lb (71.2 kg)  Height: _0  (1.727 m)  Psych/Mental status: Alert, oriented x 3 (person, place, &  time)       Eyes: PERLA Respiratory: No evidence of acute respiratory distress  Cervical Spine Area Exam  Skin & Axial Inspection: No masses, redness, edema, swelling, or associated skin lesions Alignment: Symmetrical Functional ROM: Unrestricted ROM      Stability: No instability detected Muscle Tone/Strength: Functionally intact. No obvious neuro-muscular anomalies detected. Sensory (Neurological): Unimpaired Palpation: No palpable anomalies              Upper Extremity (UE) Exam    Side: Right upper extremity  Side: Left upper extremity  Skin & Extremity Inspection: Skin color, temperature, and hair growth are WNL. No peripheral edema or cyanosis. No masses, redness, swelling, asymmetry, or associated skin lesions. No contractures.  Skin & Extremity Inspection: Skin color, temperature, and hair growth are WNL. No peripheral edema or cyanosis. No masses, redness, swelling, asymmetry, or associated skin lesions. No contractures.  Functional ROM: Restricted ROM          Functional ROM:  Adequate ROM          Muscle Tone/Strength: Functionally intact. No obvious neuro-muscular anomalies detected.  Muscle Tone/Strength: Functionally intact. No obvious neuro-muscular anomalies detected.  Sensory (Neurological): Unimpaired          Sensory (Neurological): Unimpaired          Palpation: No palpable anomalies              Palpation: No palpable anomalies              Provocative Test(s):  Phalen's test: deferred Tinel's test: deferred Apley's scratch test (touch opposite shoulder):  Action 1 (Across chest): deferred Action 2 (Overhead): deferred Action 3 (LB reach): deferred   Provocative Test(s):  Phalen's test: deferred Tinel's test: deferred Apley's scratch test (touch opposite shoulder):  Action 1 (Across chest): deferred Action 2 (Overhead): deferred Action 3 (LB reach): deferred    Thoracic Spine Area Exam  Skin & Axial Inspection: No masses, redness, or swelling Alignment:  Symmetrical Functional ROM: Unrestricted ROM Stability: No instability detected Muscle Tone/Strength: Functionally intact. No obvious neuro-muscular anomalies detected. Sensory (Neurological): Unimpaired Muscle strength & Tone: No palpable anomalies  Lumbar Spine Area Exam  Skin & Axial Inspection: Well healed scar from previous spine surgery detected Alignment: Symmetrical Functional ROM: Unrestricted ROM       Stability: No instability detected Muscle Tone/Strength: Functionally intact. No obvious neuro-muscular anomalies detected. Sensory (Neurological): Unimpaired Palpation: No palpable anomalies       Provocative Tests: Lumbar Hyperextension/rotation test: deferred today       Lumbar quadrant test (Kemp's test): deferred today       Lumbar Lateral bending test: deferred today       Patrick's Maneuver: deferred today                   FABER test: deferred today       Thigh-thrust test: deferred today       S-I compression test: deferred today       S-I distraction test: deferred today        Gait & Posture Assessment  Ambulation: Patient ambulates using a cane Gait: Relatively normal for age and body habitus Posture: WNL   Lower Extremity Exam    Side: Right lower extremity  Side: Left lower extremity  Stability: No instability observed          Stability: No instability observed          Skin & Extremity Inspection: Skin color, temperature, and hair growth are WNL. No peripheral edema or cyanosis. No masses, redness, swelling, asymmetry, or associated skin lesions. No contractures.  Skin & Extremity Inspection: Skin color, temperature, and hair growth are WNL. No peripheral edema or cyanosis. No masses, redness, swelling, asymmetry, or associated skin lesions. No contractures.  Functional ROM: Unrestricted ROM                  Functional ROM: Unrestricted ROM                  Muscle Tone/Strength: Functionally intact. No obvious neuro-muscular anomalies detected.  Muscle  Tone/Strength: Functionally intact. No obvious neuro-muscular anomalies detected.  Sensory (Neurological): Unimpaired  Sensory (Neurological): Unimpaired  Palpation: No palpable anomalies  Palpation: No palpable anomalies   Assessment  Primary Diagnosis & Pertinent Problem List: The primary encounter diagnosis was Chronic shoulder pain (S/P replacement) (Right). Diagnoses of Lumbar facet syndrome (Bilateral) (L>R), DDD (degenerative disc disease), lumbosacral, Chronic sacroiliac joint pain (Left), Opioid-induced constipation (  OIC), Chronic pain syndrome, DDD (degenerative disc disease), cervical, Restless leg syndrome, Long term prescription opiate use, and Insomnia, unspecified type were also pertinent to this visit.  Status Diagnosis  Persistent Controlled Controlled 1. Chronic shoulder pain (S/P replacement) (Right)   2. Lumbar facet syndrome (Bilateral) (L>R)   3. DDD (degenerative disc disease), lumbosacral   4. Chronic sacroiliac joint pain (Left)   5. Opioid-induced constipation (OIC)   6. Chronic pain syndrome   7. DDD (degenerative disc disease), cervical   8. Restless leg syndrome   9. Long term prescription opiate use   10. Insomnia, unspecified type     Problems updated and reviewed during this visit: Problem  Insomnia  Hyperlipidemia  Atrial Fibrillation and Flutter (Hcc)   Plan of Care  Pharmacotherapy (Medications Ordered): Meds ordered this encounter  Medications  . Melatonin 10 MG CAPS    Sig: Take 20 mg by mouth at bedtime as needed.    Dispense:  60 capsule    Refill:  2    Do not place this medication, or any other prescription from our practice, on "Automatic Refill". Patient may have prescription filled one day early if pharmacy is closed on scheduled refill date.    Order Specific Question:   Supervising Provider    Answer:   Milinda Pointer [phone]  . Oxycodone HCl 20 MG TABS    Sig: Take 1 tablet (20 mg total) by mouth every 8 (eight) hours as  needed.    Dispense:  90 tablet    Refill:  0    Do not add this medication to the electronic "Automatic Refill" notification system. Patient may have prescription filled one day early if pharmacy is closed on scheduled refill date. Do not fill until:04/07/2018 To last until: 05/07/2018    Order Specific Question:   Supervising Provider    Answer:   Milinda Pointer [phone]  . Oxycodone HCl 20 MG TABS    Sig: Take 1 tablet (20 mg total) by mouth every 8 (eight) hours as needed.    Dispense:  90 tablet    Refill:  0    Do not add this medication to the electronic "Automatic Refill" notification system. Patient may have prescription filled one day early if pharmacy is closed on scheduled refill date. Do not fill until:03/08/2018 To last until:04/07/2018    Order Specific Question:   Supervising Provider    Answer:   Milinda Pointer [phone]  . Oxycodone HCl 20 MG TABS    Sig: Take 1 tablet (20 mg total) by mouth every 8 (eight) hours as needed.    Dispense:  90 tablet    Refill:  0    Do not add this medication to the electronic "Automatic Refill" notification system. Patient may have prescription filled one day early if pharmacy is closed on scheduled refill date. Do not fill until: 02/06/2018 To last until:03/08/2018    Order Specific Question:   Supervising Provider    Answer:   Milinda Pointer [phone]  . bisacodyl (DULCOLAX) 5 MG EC tablet    Sig: Take 2 tablets (10 mg total) by mouth daily as needed for moderate constipation.    Dispense:  60 tablet    Refill:  2    Do not place this medication, or any other prescription from our practice, on "Automatic Refill". Patient may have prescription filled one day early if pharmacy is closed on scheduled refill date.    Order Specific Question:   Supervising Provider  AnswerMilinda Pointer [[id number]]  . polyethylene glycol (MIRALAX / GLYCOLAX) packet    Sig: Take 17 g by mouth daily as needed for moderate constipation.     Dispense:  14 each    Refill:  2    Do not place medication on "Automatic Refill". Fill one day early if pharmacy is closed on scheduled refill date.    Order Specific Question:   Supervising Provider    Answer:   Milinda Pointer [phone]  . rOPINIRole (REQUIP) 2 MG tablet    Sig: Take 2 tablets at bedtime    Dispense:  60 tablet    Refill:  2    Do not place this medication, or any other prescription from our practice, on "Automatic Refill". Patient may have prescription filled one day early if pharmacy is closed on scheduled refill date.    Order Specific Question:   Supervising Provider    Answer:   Milinda Pointer [[id number]]   New Prescriptions   No medications on file   Medications administered today: [name] had no medications administered during this visit. Lab-work, procedure(s), and/or referral(s): Orders Placed This Encounter  Procedures  . ToxASSURE Select 13 (MW), Urine   Imaging and/or referral(s): None  Interventional management options: Planned, scheduled, and/or pending: Not at this time.   Considering:  Diagnostic bilateral sacroiliac joint block Possible bilateral sacroiliac joint RFA Diagnostic bilateral intra-articular hip joint injection Diagnostic bilateral femoral nerve + obturator nerve block Possible bilateral femoral nerve + obturator nerve RFA Possible bilateral lumbar facet radiofrequency. (Left side done on 12/13/2016) Palliative bilateral lumbar facet block  Diagnostic right intra-articular shoulder joint injection Diagnostic right suprascapular nerve block Possible right suprascapular radiofrequencyablation.    Palliative PRN treatment(s):  Palliative bilateral lumbar facet block Diagnostic right intra-articular shoulder joint injection  Diagnostic right suprascapular nerve block Possible right suprascapular radiofrequencyablation      Provider-requested follow-up: Return in about 3 months (around  05/04/2018) for MedMgmt with Me Donella Stade [facility] Pace).  Future Appointments  Date Time Provider [place]  02/09/2018 10:30 AM Milinda Pointer, MD [facility] None  05/04/2018 11:00 AM Vevelyn Francois, NP [facility] None  07/21/2018 11:00 AM Mikey College, NP [facility] None   Primary Care Physician: Mikey College, NP Location: [facility] Note by: Vevelyn Francois NP Date: 02/01/2018; Time: 4:03 PM  Pain Score Disclaimer: We use the NRS-11 scale. This is a self-reported, subjective measurement of pain severity with only modest accuracy. It is used primarily to identify changes within a particular patient. It must be understood that outpatient pain scales are significantly less accurate that those used for research, where they can be applied under ideal controlled circumstances with minimal exposure to variables. In reality, the score is likely to be a combination of pain intensity and pain affect, where pain affect describes the degree of emotional arousal or changes in action readiness caused by the sensory experience of pain. Factors such as social and work situation, setting, emotional state, anxiety levels, expectation, and prior pain experience may influence pain perception and show large inter-individual differences that may also be affected by time variables.  Patient instructions provided during this appointment: Patient Instructions  dulcolax, melatonin, miralax and requip has been escribed to your pharmacy.  You have been given 3 Rx for oxycodone to last until 05/07/2018.____________________________________________________________________________________________  Medication Rules  Applies to: All patients receiving prescriptions (written or electronic).  Pharmacy of record: Pharmacy where electronic prescriptions will be sent. If  written prescriptions are taken to a different pharmacy, please inform the nursing staff. The pharmacy listed in  the electronic medical record should be the one where you would like electronic prescriptions to be sent.  Prescription refills: Only during scheduled appointments. Applies to both, written and electronic prescriptions.  NOTE: The following applies primarily to controlled substances (Opioid* Pain Medications).   Patient's responsibilities: 1. Pain Pills: Bring all pain pills to every appointment (except for procedure appointments). 2. Pill Bottles: Bring pills in original pharmacy bottle. Always bring newest bottle. Bring bottle, even if empty. 3. Medication refills: You are responsible for knowing and keeping track of what medications you need refilled. The day before your appointment, write a list of all prescriptions that need to be refilled. Bring that list to your appointment and give it to the admitting nurse. Prescriptions will be written only during appointments. If you forget a medication, it will not be "Called in", "Faxed", or "electronically sent". You will need to get another appointment to get these prescribed. 4. Prescription Accuracy: You are responsible for carefully inspecting your prescriptions before leaving our office. Have the discharge nurse carefully go over each prescription with you, before taking them home. Make sure that your name is accurately spelled, that your address is correct. Check the name and dose of your medication to make sure it is accurate. Check the number of pills, and the written instructions to make sure they are clear and accurate. Make sure that you are given enough medication to last until your next medication refill appointment. 5. Taking Medication: Take medication as prescribed. Never take more pills than instructed. Never take medication more frequently than prescribed. Taking less pills or less frequently is permitted and encouraged, when it comes to controlled substances (written prescriptions).  6. Inform other Doctors: Always inform, all of your  healthcare providers, of all the medications you take. 7. Pain Medication from other Providers: You are not allowed to accept any additional pain medication from any other Doctor or Healthcare provider. There are two exceptions to this rule. (see below) In the event that you require additional pain medication, you are responsible for notifying us, as stated below. 8. Medication Agreement: You are responsible for carefully reading and following our Medication Agreement. This must be signed before receiving any prescriptions from our practice. Safely store a copy of your signed Agreement. Violations to the Agreement will result in no further prescriptions. (Additional copies of our Medication Agreement are available upon request.) 9. Laws, Rules, & Regulations: All patients are expected to follow all Federal and [employer], Rules, [employer]. Ignorance of the Laws does not constitute a valid excuse. The use of any illegal substances is prohibited. 10. Adopted CDC guidelines & recommendations: Target dosing levels will be at or below 60 MME/day. Use of benzodiazepines** is not recommended.  Exceptions: There are only two exceptions to the rule of not receiving pain medications from other Healthcare Providers. 1. Exception #1 (Emergencies): In the event of an emergency (i.e.: accident requiring emergency care), you are allowed to receive additional pain medication. However, you are responsible for: As soon as you are able, call our office [phone], at any time of the day or night, and leave a message stating your name, the date and nature of the emergency, and the name and dose of the medication prescribed. In the event that your call is answered by a member of our staff, make sure to document and save the date, time, and the  name of the person that took your information.  2. Exception #2 (Planned Surgery): In the event that you are scheduled by another doctor or dentist to have any type of  surgery or procedure, you are allowed (for a period no longer than 30 days), to receive additional pain medication, for the acute post-op pain. However, in this case, you are responsible for picking up a copy of our "Post-op Pain Management for Surgeons" handout, and giving it to your surgeon or dentist. This document is available at our office, and does not require an appointment to obtain it. Simply go to our office during business hours (Monday-Thursday from 8:00 AM to 4:00 PM) (Friday 8:00 AM to 12:00 Noon) or if you have a scheduled appointment with [name], prior to your surgery, and ask for it by name. In addition, you will need to provide [name] with your name, name of your surgeon, type of surgery, and date of procedure or surgery.  *Opioid medications include: morphine, codeine, oxycodone, oxymorphone, hydrocodone, hydromorphone, meperidine, tramadol, tapentadol, buprenorphine, fentanyl, methadone. **Benzodiazepine medications include: diazepam (Valium), alprazolam (Xanax), clonazepam (Klonopine), lorazepam (Ativan), clorazepate (Tranxene), chlordiazepoxide (Librium), estazolam (Prosom), oxazepam (Serax), temazepam (Restoril), triazolam (Halcion) (Last updated: 10/20/2017) ____________________________________________________________________________________________

## 2018-02-01 NOTE — Patient Instructions (Addendum)
dulcolax, melatonin, miralax and requip has been escribed to your pharmacy.  You have been given 3 Rx for oxycodone to last until 05/07/2018.____________________________________________________________________________________________  Medication Rules  Applies to: All patients receiving prescriptions (written or electronic).  Pharmacy of record: Pharmacy where electronic prescriptions will be sent. If written prescriptions are taken to a different pharmacy, please inform the nursing staff. The pharmacy listed in the electronic medical record should be the one where you would like electronic prescriptions to be sent.  Prescription refills: Only during scheduled appointments. Applies to both, written and electronic prescriptions.  NOTE: The following applies primarily to controlled substances (Opioid* Pain Medications).   Patient's responsibilities: 1. Pain Pills: Bring all pain pills to every appointment (except for procedure appointments). 2. Pill Bottles: Bring pills in original pharmacy bottle. Always bring newest bottle. Bring bottle, even if empty. 3. Medication refills: You are responsible for knowing and keeping track of what medications you need refilled. The day before your appointment, write a list of all prescriptions that need to be refilled. Bring that list to your appointment and give it to the admitting nurse. Prescriptions will be written only during appointments. If you forget a medication, it will not be "Called in", "Faxed", or "electronically sent". You will need to get another appointment to get these prescribed. 4. Prescription Accuracy: You are responsible for carefully inspecting your prescriptions before leaving our office. Have the discharge nurse carefully go over each prescription with you, before taking them home. Make sure that your name is accurately spelled, that your address is correct. Check the name and dose of your medication to make sure it is accurate. Check the  number of pills, and the written instructions to make sure they are clear and accurate. Make sure that you are given enough medication to last until your next medication refill appointment. 5. Taking Medication: Take medication as prescribed. Never take more pills than instructed. Never take medication more frequently than prescribed. Taking less pills or less frequently is permitted and encouraged, when it comes to controlled substances (written prescriptions).  6. Inform other Doctors: Always inform, all of your healthcare providers, of all the medications you take. 7. Pain Medication from other Providers: You are not allowed to accept any additional pain medication from any other Doctor or Healthcare provider. There are two exceptions to this rule. (see below) In the event that you require additional pain medication, you are responsible for notifying us, as stated below. 8. Medication Agreement: You are responsible for carefully reading and following our Medication Agreement. This must be signed before receiving any prescriptions from our practice. Safely store a copy of your signed Agreement. Violations to the Agreement will result in no further prescriptions. (Additional copies of our Medication Agreement are available upon request.) 9. Laws, Rules, & Regulations: All patients are expected to follow all Federal and [employer], [employer], Rules, [employer]. Ignorance of the Laws does not constitute a valid excuse. The use of any illegal substances is prohibited. 10. Adopted CDC guidelines & recommendations: Target dosing levels will be at or below 60 MME/day. Use of benzodiazepines** is not recommended.  Exceptions: There are only two exceptions to the rule of not receiving pain medications from other Healthcare Providers. 1. Exception #1 (Emergencies): In the event of an emergency (i.e.: accident requiring emergency care), you are allowed to receive additional pain medication. However, you are  responsible for: As soon as you are able, call our office [phone], at any time of the day or  night, and leave a message stating your name, the date and nature of the emergency, and the name and dose of the medication prescribed. In the event that your call is answered by a member of our staff, make sure to document and save the date, time, and the name of the person that took your information.  2. Exception #2 (Planned Surgery): In the event that you are scheduled by another doctor or dentist to have any type of surgery or procedure, you are allowed (for a period no longer than 30 days), to receive additional pain medication, for the acute post-op pain. However, in this case, you are responsible for picking up a copy of our "Post-op Pain Management for Surgeons" handout, and giving it to your surgeon or dentist. This document is available at our office, and does not require an appointment to obtain it. Simply go to our office during business hours (Monday-Thursday from 8:00 AM to 4:00 PM) (Friday 8:00 AM to 12:00 Noon) or if you have a scheduled appointment with [name], prior to your surgery, and ask for it by name. In addition, you will need to provide [name] with your name, name of your surgeon, type of surgery, and date of procedure or surgery.  *Opioid medications include: morphine, codeine, oxycodone, oxymorphone, hydrocodone, hydromorphone, meperidine, tramadol, tapentadol, buprenorphine, fentanyl, methadone. **Benzodiazepine medications include: diazepam (Valium), alprazolam (Xanax), clonazepam (Klonopine), lorazepam (Ativan), clorazepate (Tranxene), chlordiazepoxide (Librium), estazolam (Prosom), oxazepam (Serax), temazepam (Restoril), triazolam (Halcion) (Last updated: 10/20/2017) ____________________________________________________________________________________________

## 2018-02-01 NOTE — Progress Notes (Signed)
Nursing Pain Medication Assessment:  Safety precautions to be maintained throughout the outpatient stay will include: orient to surroundings, keep bed in low position, maintain call bell within reach at all times, provide assistance with transfer out of bed and ambulation.  Medication Inspection Compliance: Pill count conducted under aseptic conditions, in front of the patient. Neither the pills nor the bottle was removed from the patient's sight at any time. Once count was completed pills were immediately returned to the patient in their original bottle.  Medication: oxycodone HCL 20 mg Pill/Patch Count: 4 of 90 pills remain Pill/Patch Appearance: Markings consistent with prescribed medication Bottle Appearance: Standard pharmacy container. Clearly labeled. Filled Date: 5 / 52 / 2019 Last Medication intake:  Today

## 2018-02-08 LAB — TOXASSURE SELECT 13 (MW), URINE

## 2018-02-09 ENCOUNTER — Ambulatory Visit (HOSPITAL_BASED_OUTPATIENT_CLINIC_OR_DEPARTMENT_OTHER): Payer: Medicare Other | Admitting: Pain Medicine

## 2018-02-09 ENCOUNTER — Ambulatory Visit
Admission: RE | Admit: 2018-02-09 | Discharge: 2018-02-09 | Disposition: A | Discharge status: Home / Self Care | Payer: Medicare Other | Source: Ambulatory Visit | Attending: Pain Medicine | Admitting: Pain Medicine

## 2018-02-09 ENCOUNTER — Encounter: Payer: Self-pay | Admitting: Pain Medicine

## 2018-02-09 VITALS — BP 139/82 | HR 60 | Temp 97.2°F | Resp 15 | Ht 68.0 in | Wt 157.0 lb

## 2018-02-09 DIAGNOSIS — G8918 Other acute postprocedural pain: Secondary | ICD-10-CM | POA: Insufficient documentation

## 2018-02-09 DIAGNOSIS — M47816 Spondylosis without myelopathy or radiculopathy, lumbar region: Secondary | ICD-10-CM

## 2018-02-09 DIAGNOSIS — M5442 Lumbago with sciatica, left side: Secondary | ICD-10-CM

## 2018-02-09 DIAGNOSIS — G8929 Other chronic pain: Secondary | ICD-10-CM | POA: Insufficient documentation

## 2018-02-09 DIAGNOSIS — M47817 Spondylosis without myelopathy or radiculopathy, lumbosacral region: Secondary | ICD-10-CM | POA: Insufficient documentation

## 2018-02-09 DIAGNOSIS — M5441 Lumbago with sciatica, right side: Secondary | ICD-10-CM

## 2018-02-09 DIAGNOSIS — M5137 Other intervertebral disc degeneration, lumbosacral region: Secondary | ICD-10-CM | POA: Diagnosis not present

## 2018-02-09 DIAGNOSIS — Z950 Presence of cardiac pacemaker: Secondary | ICD-10-CM | POA: Insufficient documentation

## 2018-02-09 DIAGNOSIS — M545 Low back pain: Secondary | ICD-10-CM | POA: Diagnosis present

## 2018-02-09 HISTORY — DX: Other acute postprocedural pain: G89.18

## 2018-02-09 MED ORDER — LACTATED RINGERS IV SOLN
1000.0000 mL | Freq: Once | INTRAVENOUS | Status: AC
  Administered 2018-02-09: 1000 mL via INTRAVENOUS

## 2018-02-09 MED ORDER — ROPIVACAINE HCL 2 MG/ML IJ SOLN
9.0000 mL | Freq: Once | INTRAMUSCULAR | Status: AC
  Administered 2018-02-09: 9 mL via PERINEURAL

## 2018-02-09 MED ORDER — MIDAZOLAM HCL 5 MG/5ML IJ SOLN
1.0000 mg | INTRAMUSCULAR | Status: DC | PRN
  Administered 2018-02-09: 2 mg via INTRAVENOUS
  Filled 2018-02-09: qty 5

## 2018-02-09 MED ORDER — HYDROCODONE-ACETAMINOPHEN 5-325 MG PO TABS
1.0000 | ORAL_TABLET | Freq: Four times a day (QID) | ORAL | 0 refills | Status: AC | PRN
Start: 2018-02-09 — End: 2018-02-16

## 2018-02-09 MED ORDER — ROPIVACAINE HCL 2 MG/ML IJ SOLN
4.0000 mL | Freq: Once | INTRAMUSCULAR | Status: DC
  Filled 2018-02-09: qty 10

## 2018-02-09 MED ORDER — TRIAMCINOLONE ACETONIDE 40 MG/ML IJ SUSP
40.0000 mg | Freq: Once | INTRAMUSCULAR | Status: AC
  Administered 2018-02-09: 40 mg
  Filled 2018-02-09: qty 1

## 2018-02-09 MED ORDER — LIDOCAINE HCL 2 % IJ SOLN
20.0000 mL | Freq: Once | INTRAMUSCULAR | Status: AC
  Administered 2018-02-09: 400 mg
  Filled 2018-02-09: qty 40

## 2018-02-09 MED ORDER — HYDROCODONE-ACETAMINOPHEN 5-325 MG PO TABS: 1.0000 | ORAL_TABLET | Freq: Four times a day (QID) | ORAL | 0 refills | Status: AC | PRN

## 2018-02-09 MED ORDER — FENTANYL CITRATE (PF) 100 MCG/2ML IJ SOLN
25.0000 ug | INTRAMUSCULAR | Status: DC | PRN
  Administered 2018-02-09: 25 ug via INTRAVENOUS
  Filled 2018-02-09: qty 2

## 2018-02-09 NOTE — Patient Instructions (Addendum)
_You have been given prescription for Hydrocodone for post procedure pain. ___________________________________________________________________________________________  Post-Procedure Discharge Instructions  Instructions:  Apply ice: Fill a plastic sandwich bag with crushed ice. Cover it with a small towel and apply to injection site. Apply for 15 minutes then remove x 15 minutes. Repeat sequence on day of procedure, until you go to bed. The purpose is to minimize swelling and discomfort after procedure.  Apply heat: Apply heat to procedure site starting the day following the procedure. The purpose is to treat any soreness and discomfort from the procedure.  Food intake: Start with clear liquids (like water) and advance to regular food, as tolerated.   Physical activities: Keep activities to a minimum for the first 8 hours after the procedure.   Driving: If you have received any sedation, you are not allowed to drive for 24 hours after your procedure.  Blood thinner: Restart your blood thinner 6 hours after your procedure. (Only for those taking blood thinners)  Insulin: As soon as you can eat, you may resume your normal dosing schedule. (Only for those taking insulin)  Infection prevention: Keep procedure site clean and dry.  Post-procedure Pain Diary: Extremely important that this be done correctly and accurately. Recorded information will be used to determine the next step in treatment.  Pain evaluated is that of treated area only. Do not include pain from an untreated area.  Complete every hour, on the hour, for the initial 8 hours. Set an alarm to help you do this part accurately.  Do not go to sleep and have it completed later. It will not be accurate.  Follow-up appointment: Keep your follow-up appointment after the procedure. Usually 2 weeks for most procedures. (6 weeks in the case of radiofrequency.) Bring you pain diary.   Expect:  From numbing medicine (AKA: Local  Anesthetics): Numbness or decrease in pain.  Onset: Full effect within 15 minutes of injected.  Duration: It will depend on the type of local anesthetic used. On the average, 1 to 8 hours.   From steroids: Decrease in swelling or inflammation. Once inflammation is improved, relief of the pain will follow.  Onset of benefits: Depends on the amount of swelling present. The more swelling, the longer it will take for the benefits to be seen. In some cases, up to 10 days.  Duration: Steroids will stay in the system x 2 weeks. Duration of benefits will depend on multiple posibilities including persistent irritating factors.  From procedure: Some discomfort is to be expected once the numbing medicine wears off. This should be minimal if ice and heat are applied as instructed.  Call if:  You experience numbness and weakness that gets worse with time, as opposed to wearing off.  New onset bowel or bladder incontinence. (This applies to Spinal procedures only)  Emergency Numbers:  [place] business hours (Monday - Thursday, 8:00 AM - 4:00 PM) (Friday, 9:00 AM - 12:00 Noon): [phone]  After hours: [phone] ____________________________________________________________________________________________

## 2018-02-09 NOTE — Progress Notes (Signed)
Patient's Name: Kristin [name]  MRN: [id number]  Referring Provider: Mikey College, *  DOB: 06-Apr-1930  PCP: Mikey College, NP  DOS: 02/09/2018  Note by: Gaspar Cola, MD  Service setting: Ambulatory outpatient  Specialty: Interventional Pain Management  Patient type: Established  Location: [facility] (AMB) Pain Management Facility  Visit type: Interventional Procedure   Primary Reason for Visit: Interventional Pain Management Treatment. CC: Back Pain (lower back mainly on the left)  Procedure:          Anesthesia, Analgesia, Anxiolysis:  Type: Thermal Lumbar Facet, Medial Branch Radiofrequency Ablation/Neurotomy Level: L2, L3, L4, L5, & S1 Medial Branch Level(s). These levels will denervate the L3-4, L4-5, and the L5-S1 lumbar facet joints. Primary Purpose: Therapeutic Region: Posterolateral Lumbosacral Spine Laterality: Left  Type: Moderate (Conscious) Sedation combined with Local Anesthesia Indication(s): Analgesia and Anxiety Route: Intravenous (IV) IV Access: Secured Sedation: Meaningful verbal contact was maintained at all times during the procedure  Local Anesthetic: Lidocaine 1-2%   Indications: 1. Spondylosis without myelopathy or radiculopathy, lumbosacral region   2. Lumbar facet syndrome (Bilateral) (L>R)   3. Lumbar facet hypertrophy (L1-2, L2-3, and L4-5)   4. DDD (degenerative disc disease), lumbosacral   5. Chronic low back pain (Primary Source of Pain) (Bilateral) (L>R)    Ms. Clowdus has been dealing with the above chronic pain for longer than three months and has either failed to respond, was unable to tolerate, or simply did not get enough benefit from other more conservative therapies including, but not limited to: 1. Over-the-counter medications 2. Anti-inflammatory medications 3. Muscle relaxants 4. Membrane stabilizers 5. Opioids 6. Physical therapy 7. Modalities (Heat, ice, etc.) 8. Invasive techniques such as nerve blocks. Ms. Mateja has  attained more than 50% relief of the pain from a series of diagnostic injections conducted in separate occasions.  Pain Score: Pre-procedure: 6 /10 Post-procedure: 0-No pain/10  Pre-op Assessment:  Ms. Sublette is a 84 y.o. (year old), female patient, seen today for interventional treatment. She  has a past surgical history that includes Abdominal hysterectomy; Back surgery; Rotator cuff repair; Breast surgery; Cataract extraction w/PHACO (10/19/2011); Cataract extraction w/PHACO (11/02/2011); Carpal tunnel release; Joint replacement; Colon surgery; Pacemaker insertion (N/A, 12/24/2015); Cholecystectomy; Breast lumpectomy (Right, 2004); and Insert / replace / remove pacemaker. Ms. Carel has a current medication list which includes the following prescription(s): albuterol, apixaban, bisacodyl, vitamin d, furosemide, levocetirizine, lisinopril, melatonin, oxycodone hcl, oxycodone hcl, oxycodone hcl, oxygen-helium, polyethylene glycol, ropinirole, hydrocodone-acetaminophen, and hydrocodone-acetaminophen, and the following Facility-Administered Medications: fentanyl and midazolam. Her primarily concern today is the Back Pain (lower back mainly on the left)  Initial Vital Signs:  Pulse/HCG Rate: 81ECG Heart Rate: 61 Temp: 98.2 F (36.8 C) Resp: 16 BP: 130/85 SpO2: 100 %  BMI: Estimated body mass index is 23.87 kg/m as calculated from the following:   Height as of this encounter: 5\' 8"  (1.727 m).   Weight as of this encounter: 157 lb (71.2 kg).  Risk Assessment: Allergies: Reviewed. She is allergic to flexeril [cyclobenzaprine hcl] and vancomycin.  Allergy Precautions: None required Coagulopathies: Reviewed. None identified.  Blood-thinner therapy: None at this time Active Infection(s): Reviewed. None identified. Ms. Carmicheal is afebrile  Site Confirmation: Ms. Ezzell was asked to confirm the procedure and laterality before marking the site Procedure checklist: Completed Consent: Before the  procedure and under the influence of no sedative(s), amnesic(s), or anxiolytics, the patient was informed of the treatment options, risks and possible complications. To fulfill our ethical and legal  obligations, as recommended by the American Medical Association's Code of Ethics, I have informed the patient of my clinical impression; the nature and purpose of the treatment or procedure; the risks, benefits, and possible complications of the intervention; the alternatives, including doing nothing; the risk(s) and benefit(s) of the alternative treatment(s) or procedure(s); and the risk(s) and benefit(s) of doing nothing. The patient was provided information about the general risks and possible complications associated with the procedure. These may include, but are not limited to: failure to achieve desired goals, infection, bleeding, organ or nerve damage, allergic reactions, paralysis, and death. In addition, the patient was informed of those risks and complications associated to Spine-related procedures, such as failure to decrease pain; infection (i.e.: Meningitis, epidural or intraspinal abscess); bleeding (i.e.: epidural hematoma, subarachnoid hemorrhage, or any other type of intraspinal or peri-dural bleeding); organ or nerve damage (i.e.: Any type of peripheral nerve, nerve root, or spinal cord injury) with subsequent damage to sensory, motor, and/or autonomic systems, resulting in permanent pain, numbness, and/or weakness of one or several areas of the body; allergic reactions; (i.e.: anaphylactic reaction); and/or death. Furthermore, the patient was informed of those risks and complications associated with the medications. These include, but are not limited to: allergic reactions (i.e.: anaphylactic or anaphylactoid reaction(s)); adrenal axis suppression; blood sugar elevation that in diabetics may result in ketoacidosis or comma; water retention that in patients with history of congestive heart failure  may result in shortness of breath, pulmonary edema, and decompensation with resultant heart failure; weight gain; swelling or edema; medication-induced neural toxicity; particulate matter embolism and blood vessel occlusion with resultant organ, and/or nervous system infarction; and/or aseptic necrosis of one or more joints. Finally, the patient was informed that Medicine is not an exact science; therefore, there is also the possibility of unforeseen or unpredictable risks and/or possible complications that may result in a catastrophic outcome. The patient indicated having understood very clearly. We have given the patient no guarantees and we have made no promises. Enough time was given to the patient to ask questions, all of which were answered to the patient's satisfaction. Ms. Samet has indicated that she wanted to continue with the procedure. Attestation: I, the ordering provider, attest that I have discussed with the patient the benefits, risks, side-effects, alternatives, likelihood of achieving goals, and potential problems during recovery for the procedure that I have provided informed consent. Date  Time: 02/09/2018 10:29 AM  Pre-Procedure Preparation:  Monitoring: As per clinic protocol. Respiration, ETCO2, SpO2, BP, heart rate and rhythm monitor placed and checked for adequate function Safety Precautions: Patient was assessed for positional comfort and pressure points before starting the procedure. Time-out: I initiated and conducted the "Time-out" before starting the procedure, as per protocol. The patient was asked to participate by confirming the accuracy of the "Time Out" information. Verification of the correct person, site, and procedure were performed and confirmed by me, the nursing staff, and the patient. "Time-out" conducted as per Joint Commission's Universal Protocol (UP.01.01.01). Time: 1131  Description of Procedure:          Position: Prone Laterality: Left Levels:  L2, L3,  L4, L5, & S1 Medial Branch Level(s), at the L3-4, L4-5, and the L5-S1 lumbar facet joints. Area Prepped: Lumbosacral Prepping solution: ChloraPrep (2% chlorhexidine gluconate and 70% isopropyl alcohol) Safety Precautions: Aspiration looking for blood return was conducted prior to all injections. At no point did we inject any substances, as a needle was being advanced. Before injecting, the patient was told  to immediately notify me if she was experiencing any new onset of "ringing in the ears, or metallic taste in the mouth". No attempts were made at seeking any paresthesias. Safe injection practices and needle disposal techniques used. Medications properly checked for expiration dates. SDV (single dose vial) medications used. After the completion of the procedure, all disposable equipment used was discarded in the proper designated medical waste containers. Local Anesthesia: Protocol guidelines were followed. The patient was positioned over the fluoroscopy table. The area was prepped in the usual manner. The time-out was completed. The target area was identified using fluoroscopy. A 12-in long, straight, sterile hemostat was used with fluoroscopic guidance to locate the targets for each level blocked. Once located, the skin was marked with an approved surgical skin marker. Once all sites were marked, the skin (epidermis, dermis, and hypodermis), as well as deeper tissues (fat, connective tissue and muscle) were infiltrated with a small amount of a short-acting local anesthetic, loaded on a 10cc syringe with a 25G, 1.5-in  Needle. An appropriate amount of time was allowed for local anesthetics to take effect before proceeding to the next step. Local Anesthetic: Lidocaine 2.0% The unused portion of the local anesthetic was discarded in the proper designated containers. Technical explanation of process:  Radiofrequency Ablation (RFA) L2 Medial Branch Nerve RFA: The target area for the L2 medial branch is at the  junction of the postero-lateral aspect of the superior articular process and the superior, posterior, and medial edge of the transverse process of L3. Under fluoroscopic guidance, a Radiofrequency needle was inserted until contact was made with os over the superior postero-lateral aspect of the pedicular shadow (target area). Sensory and motor testing was conducted to properly adjust the position of the needle. Once satisfactory placement of the needle was achieved, the numbing solution was slowly injected after negative aspiration for blood. 2.0 mL of the nerve block solution was injected without difficulty or complication. After waiting for at least 3 minutes, the ablation was performed. Once completed, the needle was removed intact. L3 Medial Branch Nerve RFA: The target area for the L3 medial branch is at the junction of the postero-lateral aspect of the superior articular process and the superior, posterior, and medial edge of the transverse process of L4. Under fluoroscopic guidance, a Radiofrequency needle was inserted until contact was made with os over the superior postero-lateral aspect of the pedicular shadow (target area). Sensory and motor testing was conducted to properly adjust the position of the needle. Once satisfactory placement of the needle was achieved, the numbing solution was slowly injected after negative aspiration for blood. 2.0 mL of the nerve block solution was injected without difficulty or complication. After waiting for at least 3 minutes, the ablation was performed. Once completed, the needle was removed intact. L4 Medial Branch Nerve RFA: The target area for the L4 medial branch is at the junction of the postero-lateral aspect of the superior articular process and the superior, posterior, and medial edge of the transverse process of L5. Under fluoroscopic guidance, a Radiofrequency needle was inserted until contact was made with os over the superior postero-lateral aspect of the  pedicular shadow (target area). Sensory and motor testing was conducted to properly adjust the position of the needle. Once satisfactory placement of the needle was achieved, the numbing solution was slowly injected after negative aspiration for blood. 2.0 mL of the nerve block solution was injected without difficulty or complication. After waiting for at least 3 minutes, the ablation was performed.  Once completed, the needle was removed intact. L5 Medial Branch Nerve RFA: The target area for the L5 medial branch is at the junction of the postero-lateral aspect of the superior articular process of S1 and the superior, posterior, and medial edge of the sacral ala. Under fluoroscopic guidance, a Radiofrequency needle was inserted until contact was made with os over the superior postero-lateral aspect of the pedicular shadow (target area). Sensory and motor testing was conducted to properly adjust the position of the needle. Once satisfactory placement of the needle was achieved, the numbing solution was slowly injected after negative aspiration for blood. 2.0 mL of the nerve block solution was injected without difficulty or complication. After waiting for at least 3 minutes, the ablation was performed. Once completed, the needle was removed intact. S1 Medial Branch Nerve RFA: The target area for the S1 medial branch is located inferior to the junction of the S1 superior articular process and the L5 inferior articular process, posterior, inferior, and lateral to the 6 o'clock position of the L5-S1 facet joint, just superior to the S1 posterior foramen. Under fluoroscopic guidance, the Radiofrequency needle was advanced until contact was made with os over the Target area. Sensory and motor testing was conducted to properly adjust the position of the needle. Once satisfactory placement of the needle was achieved, the numbing solution was slowly injected after negative aspiration for blood. 2.0 mL of the nerve block  solution was injected without difficulty or complication. After waiting for at least 3 minutes, the ablation was performed. Once completed, the needle was removed intact. Radiofrequency lesioning (ablation):  Radiofrequency Generator: NeuroTherm [id number] Sensory Stimulation Parameters: 50 Hz was used to locate & identify the nerve, making sure that the needle was positioned such that there was no sensory stimulation below 0.3 V or above 0.7 V. Motor Stimulation Parameters: 2 Hz was used to evaluate the motor component. Care was taken not to lesion any nerves that demonstrated motor stimulation of the lower extremities at an output of less than 2.5 times that of the sensory threshold, or a maximum of 2.0 V. Lesioning Technique Parameters: Standard Radiofrequency settings. (Not bipolar or pulsed.) Temperature Settings: 80 degrees C Lesioning time: 60 seconds Intra-operative Compliance: Compliant Materials & Medications: Needle(s) (Electrode/Cannula) Type: Teflon-coated, curved tip, Radiofrequency needle(s) Gauge: 22G Length: 10cm Numbing solution: 0.2% PF-Ropivacaine + Triamcinolone (40 mg/mL) diluted to a final concentration of 4 mg of Triamcinolone/mL of Ropivacaine The unused portion of the solution was discarded in the proper designated containers.  Once the entire procedure was completed, the treated area was cleaned, making sure to leave some of the prepping solution back to take advantage of its long term bactericidal properties.  Illustration of the posterior view of the lumbar spine and the posterior neural structures. Laminae of L2 through S1 are labeled. DPRL5, dorsal primary ramus of L5; DPRS1, dorsal primary ramus of S1; DPR3, dorsal primary ramus of L3; FJ, facet (zygapophyseal) joint L3-L4; I, inferior articular process of L4; LB1, lateral branch of dorsal primary ramus of L1; IAB, inferior articular branches from L3 medial branch (supplies L4-L5 facet joint); IBP, intermediate branch  plexus; MB3, medial branch of dorsal primary ramus of L3; NR3, third lumbar nerve root; S, superior articular process of L5; SAB, superior articular branches from L4 (supplies L4-5 facet joint also); TP3, transverse process of L3.  Vitals:   02/09/18 1215 02/09/18 1220 02/09/18 1230 02/09/18 1240  BP: (!) 148/90 108/82 (!) 144/78 139/82  Pulse:  63 60 60  Resp:  19 12 15   Temp:  (!) 97.4 F (36.3 C)  (!) 97.2 F (36.2 C)  TempSrc:  Temporal  Temporal  SpO2: 100% 99% 98% 97%  Weight:      Height:        Start Time: 1131 hrs. End Time: 1211 hrs.  Imaging Guidance (Spinal):          Type of Imaging Technique: Fluoroscopy Guidance (Spinal) Indication(s): Assistance in needle guidance and placement for procedures requiring needle placement in or near specific anatomical locations not easily accessible without such assistance. Exposure Time: Please see nurses notes. Contrast: None used. Fluoroscopic Guidance: I was personally present during the use of fluoroscopy. "Tunnel Vision Technique" used to obtain the best possible view of the target area. Parallax error corrected before commencing the procedure. "Direction-depth-direction" technique used to introduce the needle under continuous pulsed fluoroscopy. Once target was reached, antero-posterior, oblique, and lateral fluoroscopic projection used confirm needle placement in all planes. Images permanently stored in EMR. Interpretation: No contrast injected. I personally interpreted the imaging intraoperatively. Adequate needle placement confirmed in multiple planes. Permanent images saved into the patient's record.  Antibiotic Prophylaxis:   Anti-infectives (From admission, onward)   None     Indication(s): None identified  Post-operative Assessment:  Post-procedure Vital Signs:  Pulse/HCG Rate: 6064 Temp: (!) 97.2 F (36.2 C) Resp: 15 BP: 139/82 SpO2: 97 %  EBL: None  Complications: No immediate post-treatment complications  observed by team, or reported by patient.  Note: The patient tolerated the entire procedure well. A repeat set of vitals were taken after the procedure and the patient was kept under observation following institutional policy, for this type of procedure. Post-procedural neurological assessment was performed, showing return to baseline, prior to discharge. The patient was provided with post-procedure discharge instructions, including a section on how to identify potential problems. Should any problems arise concerning this procedure, the patient was given instructions to immediately contact us, at any time, without hesitation. In any case, we plan to contact the patient by telephone for a follow-up status report regarding this interventional procedure.  Comments:  No additional relevant information.  Plan of Care   Possible POC:  Will have the patient return in approximately 2 weeks for a right-sided lumbar facet RFA. After that, we will then schedule her for the suprascapular RFA.    Imaging Orders     DG C-Arm 1-60 Min-No Report  Procedure Orders     Radiofrequency,Lumbar  Medications ordered for procedure: Meds ordered this encounter  Medications  . lidocaine (XYLOCAINE) 2 % (with pres) injection 400 mg  . midazolam (VERSED) 5 MG/5ML injection 1-2 mg    Make sure Flumazenil is available in the pyxis when using this medication. If oversedation occurs, administer 0.2 mg IV over 15 sec. If after 45 sec no response, administer 0.2 mg again over 1 min; may repeat at 1 min intervals; not to exceed 4 doses (1 mg)  . fentaNYL (SUBLIMAZE) injection 25-50 mcg    Make sure Narcan is available in the pyxis when using this medication. In the event of respiratory depression (RR< 8/min): Titrate NARCAN (naloxone) in increments of 0.1 to 0.2 mg IV at 2-3 minute intervals, until desired degree of reversal.  . lactated ringers infusion 1,000 mL  . DISCONTD: ropivacaine (PF) 2 mg/mL (0.2%) (NAROPIN)  injection 4 mL  . triamcinolone acetonide (KENALOG-40) injection 40 mg  . ropivacaine (PF) 2 mg/mL (0.2%) (NAROPIN) injection 9 mL  . HYDROcodone-acetaminophen (NORCO/VICODIN) 5-325  MG tablet    Sig: Take 1 tablet by mouth every 6 (six) hours as needed for up to 7 days for moderate pain.    Dispense:  28 tablet    Refill:  0    For acute post-operative pain. Not to be refilled. To last 7 days.  Marland Kitchen HYDROcodone-acetaminophen (NORCO/VICODIN) 5-325 MG tablet    Sig: Take 1 tablet by mouth every 6 (six) hours as needed for up to 7 days for moderate pain.    Dispense:  28 tablet    Refill:  0    For acute post-operative pain. Not to be refilled. To last 7 days.   Medications administered: We administered lidocaine, midazolam, fentaNYL, lactated ringers, triamcinolone acetonide, and ropivacaine (PF) 2 mg/mL (0.2%).  See the medical record for exact dosing, route, and time of administration.  New Prescriptions   HYDROCODONE-ACETAMINOPHEN (NORCO/VICODIN) 5-325 MG TABLET    Take 1 tablet by mouth every 6 (six) hours as needed for up to 7 days for moderate pain.   HYDROCODONE-ACETAMINOPHEN (NORCO/VICODIN) 5-325 MG TABLET    Take 1 tablet by mouth every 6 (six) hours as needed for up to 7 days for moderate pain.   Disposition: Discharge home  Discharge Date & Time: 02/09/2018; 1245 hrs.   Physician-requested Follow-up: Return for contralateral RFA (2 wks): (R) L-FCT RFA.  Future Appointments  Date Time Provider [place]  05/04/2018 11:00 AM Vevelyn Francois, NP [facility] None  07/21/2018 11:00 AM Mikey College, NP [facility] None   Primary Care Physician: Mikey College, NP Location: [facility] Note by: Gaspar Cola, MD Date: 02/09/2018; Time: 1:59 PM  Disclaimer:  Medicine is not an Chief Strategy Officer. The only guarantee in medicine is that nothing is guaranteed. It is important to note that the decision to proceed with this intervention  was based on the information collected from the patient. The Data and conclusions were drawn from the patient's questionnaire, the interview, and the physical examination. Because the information was provided in large part by the patient, it cannot be guaranteed that it has not been purposely or unconsciously manipulated. Every effort has been made to obtain as much relevant data as possible for this evaluation. It is important to note that the conclusions that lead to this procedure are derived in large part from the available data. Always take into account that the treatment will also be dependent on availability of resources and existing treatment guidelines, considered by other Pain Management Practitioners as being common knowledge and practice, at the time of the intervention. For Medico-Legal purposes, it is also important to point out that variation in procedural techniques and pharmacological choices are the acceptable norm. The indications, contraindications, technique, and results of the above procedure should only be interpreted and judged by a Board-Certified Interventional Pain Specialist with extensive familiarity and expertise in the same exact procedure and technique.

## 2018-02-10 ENCOUNTER — Telehealth: Payer: Self-pay | Admitting: *Deleted

## 2018-02-10 NOTE — Telephone Encounter (Signed)
Attempted to call for post procedure follow-up. Message left. 

## 2018-03-10 ENCOUNTER — Other Ambulatory Visit: Payer: Self-pay

## 2018-03-10 DIAGNOSIS — G8929 Other chronic pain: Secondary | ICD-10-CM

## 2018-03-10 DIAGNOSIS — J302 Other seasonal allergic rhinitis: Secondary | ICD-10-CM

## 2018-03-10 DIAGNOSIS — M5137 Other intervertebral disc degeneration, lumbosacral region: Secondary | ICD-10-CM

## 2018-03-10 DIAGNOSIS — M47817 Spondylosis without myelopathy or radiculopathy, lumbosacral region: Secondary | ICD-10-CM

## 2018-03-10 DIAGNOSIS — I5032 Chronic diastolic (congestive) heart failure: Secondary | ICD-10-CM

## 2018-03-10 DIAGNOSIS — M47816 Spondylosis without myelopathy or radiculopathy, lumbar region: Secondary | ICD-10-CM

## 2018-03-10 DIAGNOSIS — M5442 Lumbago with sciatica, left side: Secondary | ICD-10-CM

## 2018-03-10 DIAGNOSIS — M5441 Lumbago with sciatica, right side: Secondary | ICD-10-CM

## 2018-03-10 MED ORDER — LEVOCETIRIZINE DIHYDROCHLORIDE 5 MG PO TABS: 5.0000 mg | ORAL_TABLET | Freq: Every day | ORAL | 5 refills | Status: DC

## 2018-03-10 MED ORDER — FUROSEMIDE 20 MG PO TABS: 10.0000 mg | ORAL_TABLET | Freq: Every day | ORAL | 1 refills | Status: DC

## 2018-03-16 ENCOUNTER — Encounter: Payer: Self-pay | Admitting: Nurse Practitioner

## 2018-03-17 NOTE — Telephone Encounter (Signed)
The pt granddaughter Kristin [name] called stating her gma blood pressure is running 90/57- 101/54. She was experience some mild dizziness yesterday, but today no dizziness. She held her Lisinopril on yesterday and granddaughter wants to know if she should continue holding the medication? Please advise

## 2018-03-17 NOTE — Telephone Encounter (Signed)
Weight: 153-154  Suspect patient is dehydrated as cause for hypotension.  HOLD furosemide until weight 155-156 lbs.  HOLD lisinopril until BP > 120/80.  Patient verbalizes understanding.

## 2018-03-27 ENCOUNTER — Ambulatory Visit: Payer: Medicare Other | Admitting: Nurse Practitioner

## 2018-04-05 ENCOUNTER — Telehealth: Payer: Self-pay

## 2018-04-05 NOTE — Telephone Encounter (Signed)
The pt called with concern because her blood pressure been elevated the last 2 days. Her blood pressure 146/79 yesterday and today 151/89, but asymptomatic. She is still taking her blood pressure medication as directed.

## 2018-04-05 NOTE — Telephone Encounter (Signed)
Weight: 153 lbs = stable dry weight.  Has not had any change in dietary salt intake. Is taking lisinopril 10 mg daily and lasix 10 mg daily as prescribed without changes.  No other new symptoms with blood pressure like headache, nose bleeds, or dizziness.   Patient does state she is having worsening pain in her back and shoulders.  Is taking oxycodone 20 mg for pain.    May take acetaminophen [phone] mg three times daily for pain in addition to oxycodone as prescribed by pain management.  Call clinic if BP remains over 150 SBP in 7 days.  Goal: improve pain management.

## 2018-04-12 ENCOUNTER — Other Ambulatory Visit: Payer: Self-pay | Admitting: Nurse Practitioner

## 2018-04-12 DIAGNOSIS — G2581 Restless legs syndrome: Secondary | ICD-10-CM

## 2018-04-25 ENCOUNTER — Ambulatory Visit (INDEPENDENT_AMBULATORY_CARE_PROVIDER_SITE_OTHER): Payer: Medicare Other

## 2018-04-25 VITALS — BP 118/72 | HR 76 | Temp 99.1°F | Resp 16 | Ht 65.5 in | Wt 154.3 lb

## 2018-04-25 DIAGNOSIS — Z0000 Encounter for general adult medical examination without abnormal findings: Secondary | ICD-10-CM | POA: Diagnosis not present

## 2018-04-25 NOTE — Progress Notes (Signed)
Subjective:   Kristin [name] is a 84 y.o. female who presents for Medicare Annual (Subsequent) preventive examination.  Review of Systems:  Cardiac Risk Factors include: advanced age (>68men, >52 women);hypertension;smoking/ tobacco exposure;dyslipidemia     Objective:     Vitals: BP 118/72 (BP Location: Right Arm, Patient Position: Sitting)   Pulse 76   Temp 99.1 F (37.3 C) (Oral)   Resp 16   Ht 5' 5.5" (1.664 m)   Wt 154 lb 4.8 oz (70 kg)   BMI 25.29 kg/m   Body mass index is 25.29 kg/m.  Advanced Directives 04/25/2018 02/01/2018 10/13/2017 08/14/2017 08/14/2017 08/09/2017 05/24/2017  Does Patient Have a Medical Advance Directive? Yes Yes No No Yes Yes No  Type of Paramedic of [place];Living will [place];Living will - - [place];Living will [place];Living will -  Copy of [place] in Chart? No - copy requested No - copy requested - - No - copy requested - -  Would patient like information on creating a medical advance directive? - - - No - Patient declined - - -  Pre-existing out of facility DNR order (yellow form or pink MOST form) - - - - - - -    Tobacco Social History   Tobacco Use  Smoking Status Former Smoker  . Packs/day: 1.25  . Years: 40.00  . Pack years: 50.00  . Types: Cigarettes  . Last attempt to quit: 12/16/1998  . Years since quitting: 19.3  Smokeless Tobacco Never Used     Counseling given: Not Answered   Clinical Intake:  Pre-visit preparation completed: Yes  Pain : 0-10 Pain Score: 6  Pain Type: Chronic pain Pain Location: Back Pain Orientation: Lower Pain Descriptors / Indicators: Aching Pain Onset: More than a month ago Pain Frequency: Constant     Nutritional Status: BMI 25 -29 Overweight Nutritional Risks: None Diabetes: No  How often do you need to have someone help you when you read instructions, pamphlets, or other written  materials from your doctor or pharmacy?: 1 - Never What is the last grade level you completed in school?: 12th grade  Interpreter Needed?: No  Information entered by :: Tiffany Hill,LPN   Past Medical History:  Diagnosis Date  . Arrhythmia, sinus node 04/03/2014  . Arthritis   . Arthritis   . Atrial fibrillation ([place])   . Back pain    lower back chronic  . Bradycardia   . Breast cancer ([place]) 2004   right breast cancer  . Cancer [facility]) 2004   rt breast cancer-post lumpectomy- chemo/rad  . CHF (congestive heart failure) ([place])   . Chronic back pain   . CKD (chronic kidney disease)   . COPD (chronic obstructive pulmonary disease) ([facility])    wears O2 at 2L via [place] at night  . Cystocele   . Decubitus ulcers   . Dehydration   . Gout   . Hematuria   . Hepatitis C   . Hyperlipidemia   . Hypertension   . Hypomagnesemia 06/25/2015  . OAB (overactive bladder)   . Overactive bladder   . Restless leg   . Shoulder pain, bilateral   . Urinary frequency   . UTI (lower urinary tract infection)   . Vaginal atrophy    Past Surgical History:  Procedure Laterality Date  . ABDOMINAL HYSTERECTOMY    . BACK SURGERY    . BREAST LUMPECTOMY Right 2004  . BREAST SURGERY  rt lumpectomy  . CARPAL TUNNEL RELEASE    . CATARACT EXTRACTION W/PHACO  10/19/2011   Procedure: CATARACT EXTRACTION PHACO AND INTRAOCULAR LENS PLACEMENT (IOC);  Surgeon: Elta Guadeloupe T. Gershon Crane, MD;  Location: [facility];  Service: Ophthalmology;  Laterality: Right;  CDE:10.81  . CATARACT EXTRACTION W/PHACO  11/02/2011   Procedure: CATARACT EXTRACTION PHACO AND INTRAOCULAR LENS PLACEMENT (IOC);  Surgeon: Elta Guadeloupe T. Gershon Crane, MD;  Location: [facility];  Service: Ophthalmology;  Laterality: Left;  CDE 8.60  . CHOLECYSTECTOMY     3/18  . COLON SURGERY    . INSERT / REPLACE / REMOVE PACEMAKER    . JOINT REPLACEMENT     bilateral TKA  . PACEMAKER INSERTION N/A 12/24/2015   Procedure: INSERTION PACEMAKER;  Surgeon: Isaias Cowman, MD;   Location: [facility] ORS;  Service: Cardiovascular;  Laterality: N/A;  . ROTATOR CUFF REPAIR     bilateral   Family History  Problem Relation Age of Onset  . Kidney disease Brother   . Heart disease Father   . Heart disease Mother   . Stroke Mother   . Cancer Sister   . Breast cancer Sister 48  . Breast cancer Paternal Aunt   . Anesthesia problems Neg Hx   . Hypotension Neg Hx   . Malignant hyperthermia Neg Hx   . Pseudochol deficiency Neg Hx    Social History   Socioeconomic History  . Marital status: Single    Spouse name: Not on file  . Number of children: 2  . Years of education: 15  . Highest education level: 12th grade  Occupational History  . Occupation: retired  Scientific laboratory technician  . Financial resource strain: Very hard  . Food insecurity:    Worry: Often true    Inability: Often true  . Transportation needs:    Medical: No    Non-medical: No  Tobacco Use  . Smoking status: Former Smoker    Packs/day: 1.25    Years: 40.00    Pack years: 50.00    Types: Cigarettes    Last attempt to quit: 12/16/1998    Years since quitting: 19.3  . Smokeless tobacco: Never Used  Substance and Sexual Activity  . Alcohol use: No    Alcohol/week: 0.0 standard drinks  . Drug use: No  . Sexual activity: Not Currently    Birth control/protection: Surgical  Lifestyle  . Physical activity:    Days per week: 3 days    Minutes per session: 10 min  . Stress: Rather much  Relationships  . Social connections:    Talks on phone: More than three times a week    Gets together: Three times a week    Attends religious service: More than 4 times per year    Active member of club or organization: No    Attends meetings of clubs or organizations: Never    Relationship status: Patient refused  Other Topics Concern  . Not on file  Social History Narrative  . Not on file    Outpatient Encounter Medications as of 04/25/2018  Medication Sig  . albuterol (PROVENTIL) (2.5 MG/3ML) 0.083% nebulizer  solution Take 3 mLs (2.5 mg total) by nebulization every 8 (eight) hours as needed. Shortness of breath and wheezing  . apixaban (ELIQUIS) 5 MG TABS tablet Take 1 tablet (5 mg total) by mouth 2 (two) times daily.  . bisacodyl (DULCOLAX) 5 MG EC tablet Take 2 tablets (10 mg total) by mouth daily as needed for moderate constipation.  . Cholecalciferol (  VITAMIN D) 2000 units tablet Take 2,000 Units by mouth daily.   . furosemide (LASIX) 20 MG tablet Take 0.5 tablets (10 mg total) by mouth daily.  Marland Kitchen levocetirizine (XYZAL) 5 MG tablet Take 1 tablet (5 mg total) by mouth daily.  Marland Kitchen lisinopril (PRINIVIL,ZESTRIL) 10 MG tablet Take 1 tablet (10 mg total) by mouth daily.  . Melatonin 10 MG CAPS Take 20 mg by mouth at bedtime as needed.  . nystatin (MYCOSTATIN) 100000 UNIT/ML suspension   . Oxycodone HCl 20 MG TABS Take 1 tablet (20 mg total) by mouth every 8 (eight) hours as needed.  . OXYGEN Inhale 2 L into the lungs at bedtime.   . polyethylene glycol (MIRALAX / GLYCOLAX) packet Take 17 g by mouth daily as needed for moderate constipation.  Marland Kitchen rOPINIRole (REQUIP) 2 MG tablet Take 2 tablets at bedtime  . Oxycodone HCl 20 MG TABS Take 1 tablet (20 mg total) by mouth every 8 (eight) hours as needed.  . Oxycodone HCl 20 MG TABS Take 1 tablet (20 mg total) by mouth every 8 (eight) hours as needed.   No facility-administered encounter medications on file as of 04/25/2018.     Activities of Daily Living In your present state of health, do you have any difficulty performing the following activities: 04/25/2018 08/29/2017  Hearing? Y Y  Comment no hearing aids  -  Vision? N N  Difficulty concentrating or making decisions? N N  Walking or climbing stairs? Y Y  Comment pain and sob  -  Dressing or bathing? N N  Doing errands, shopping? N N  Preparing Food and eating ? N -  Using the Toilet? N -  In the past six months, have you accidently leaked urine? N -  Do you have problems with loss of bowel control? N -    Managing your Medications? N -  Managing your Finances? N -  Housekeeping or managing your Housekeeping? N -  Some recent data might be hidden    Patient Care Team: Mikey College, NP as PCP - General (Nurse Practitioner) Olin Hauser, DO as Consulting Physician (Family Medicine) Isaias Cowman, MD as Consulting Physician (Cardiology) Milinda Pointer, MD as Referring Physician (Pain Medicine) Alisa Graff, FNP as Nurse Practitioner (Family Medicine)    Assessment:   This is a routine wellness examination for Allycia.  Exercise Activities and Dietary recommendations Exercise limited by: None identified  Goals    . DIET - INCREASE WATER INTAKE     Recommend drinking at least 6-8 glasses of water a day        Fall Risk Fall Risk  04/25/2018 02/09/2018 02/01/2018 12/26/2017 11/01/2017  Falls in the past year? No No No No No  Risk for fall due to : - - - - -   Is the patient's home free of loose throw rugs in walkways, pet beds, electrical cords, etc?   yes      Grab bars in the bathroom? yes      Handrails on the stairs?   yes      Adequate lighting?   yes  Timed Get Up and Go performed: Completed in 8 seconds with no use of assistive devices, steady gait. No intervention needed at this time.   Depression Screen PHQ 2/9 Scores 04/25/2018 02/01/2018 11/01/2017 10/13/2017  PHQ - 2 Score 0 0 0 0  Exception Documentation - - - -     Cognitive Function     6CIT Screen 04/25/2018  04/19/2017  What Year? 0 points 0 points  What month? 0 points 0 points  What time? 0 points 0 points  Count back from 20 0 points 0 points  Months in reverse 0 points 0 points  Repeat phrase 2 points 0 points  Total Score 2 0    Immunization History  Administered Date(s) Administered  . Influenza, High Dose Seasonal PF 06/17/2015, 06/22/2016, 04/19/2017  . Pneumococcal Conjugate-13 07/30/2014  . Pneumococcal Polysaccharide-23 08/23/2008, 01/18/2018  . Tdap 07/30/2014     Qualifies for Shingles Vaccine? Yes, discussed shingrix vaccine   Screening Tests Health Maintenance  Topic Date Due  . INFLUENZA VACCINE  03/23/2018  . TETANUS/TDAP  07/30/2024  . DEXA SCAN  Completed  . PNA vac Low Risk Adult  Completed    Cancer Screenings: Lung: Low Dose CT Chest recommended if Age 60-80 years, 30 pack-year currently smoking OR have quit w/in 15years. Patient does not qualify. Breast:  Up to date on Mammogram? Yes   Up to date of Bone Density/Dexa? Yes Colorectal: no longer required  Additional Screenings: Hepatitis C Screening: not indicated      Plan:    I have personally reviewed and addressed the Medicare Annual Wellness questionnaire and have noted the following in the patient's chart:  A. Medical and social history B. Use of alcohol, tobacco or illicit drugs  C. Current medications and supplements D. Functional ability and status E.  Nutritional status F.  Physical activity G. Advance directives H. List of other physicians I.  Hospitalizations, surgeries, and ER visits in previous 12 months J.  [place] such as hearing and vision if needed, cognitive and depression L. Referrals and appointments   In addition, I have reviewed and discussed with patient certain preventive protocols, quality metrics, and best practice recommendations. A written personalized care plan for preventive services as well as general preventive health recommendations were provided to patient.   Signed,  Tyler Aas, LPN Nurse Health Advisor   Nurse Notes:none

## 2018-04-25 NOTE — Patient Instructions (Addendum)
Kristin [name] , Thank you for taking time to come for your Medicare Wellness Visit. I appreciate your ongoing commitment to your health goals. Please review the following plan we discussed and let me know if I can assist you in the future.   Screening recommendations/referrals: Colonoscopy: no longer required Mammogram: no longer required Bone Density: completed 06/17/2015 Recommended yearly ophthalmology/optometry visit for glaucoma screening and checkup Recommended yearly dental visit for hygiene and checkup  Vaccinations: Influenza vaccine: due 05/2018 Pneumococcal vaccine: completed series Tdap vaccine: up to date Shingles vaccine: shingrix eligible, check with your insurance company for coverage     Advanced directives: Please bring a copy of your health care power of attorney and living will to the office at your convenience.  Conditions/risks identified: Recommend drinking at least 6-8 glasses of water a day   Next appointment: Follow up on 07/21/2018 at 11:00am with Lissa Merlin. Follow up in one year for your annual wellness exam.    Preventive Care 65 Years and Older, Female Preventive care refers to lifestyle choices and visits with your health care provider that can promote health and wellness. What does preventive care include?  A yearly physical exam. This is also called an annual well check.  Dental exams once or twice a year.  Routine eye exams. Ask your health care provider how often you should have your eyes checked.  Personal lifestyle choices, including:  Daily care of your teeth and gums.  Regular physical activity.  Eating a healthy diet.  Avoiding tobacco and drug use.  Limiting alcohol use.  Practicing safe sex.  Taking low-dose aspirin every day.  Taking vitamin and mineral supplements as recommended by your health care provider. What happens during an annual well check? The services and screenings done by your health care provider during  your annual well check will depend on your age, overall health, lifestyle risk factors, and family history of disease. Counseling  Your health care provider may ask you questions about your:  Alcohol use.  Tobacco use.  Drug use.  Emotional well-being.  Home and relationship well-being.  Sexual activity.  Eating habits.  History of falls.  Memory and ability to understand (cognition).  Work and work Statistician.  Reproductive health. Screening  You may have the following tests or measurements:  Height, weight, and BMI.  Blood pressure.  Lipid and cholesterol levels. These may be checked every 5 years, or more frequently if you are over 61 years old.  Skin check.  Lung cancer screening. You may have this screening every year starting at age 40 if you have a 30-pack-year history of smoking and currently smoke or have quit within the past 15 years.  Fecal occult blood test (FOBT) of the stool. You may have this test every year starting at age 30.  Flexible sigmoidoscopy or colonoscopy. You may have a sigmoidoscopy every 5 years or a colonoscopy every 10 years starting at age 23.  Hepatitis C blood test.  Hepatitis B blood test.  Sexually transmitted disease (STD) testing.  Diabetes screening. This is done by checking your blood sugar (glucose) after you have not eaten for a while (fasting). You may have this done every 1-3 years.  Bone density scan. This is done to screen for osteoporosis. You may have this done starting at age 61.  Mammogram. This may be done every 1-2 years. Talk to your health care provider about how often you should have regular mammograms. Talk with your health care provider about your test  results, treatment options, and if necessary, the need for more tests. Vaccines  Your health care provider may recommend certain vaccines, such as:  Influenza vaccine. This is recommended every year.  Tetanus, diphtheria, and acellular pertussis (Tdap,  Td) vaccine. You may need a Td booster every 10 years.  Zoster vaccine. You may need this after age 68.  Pneumococcal 13-valent conjugate (PCV13) vaccine. One dose is recommended after age 48.  Pneumococcal polysaccharide (PPSV23) vaccine. One dose is recommended after age 72. Talk to your health care provider about which screenings and vaccines you need and how often you need them. This information is not intended to replace advice given to you by your health care provider. Make sure you discuss any questions you have with your health care provider. Document Released: 09/05/2015 Document Revised: 04/28/2016 Document Reviewed: 06/10/2015 Elsevier Interactive Patient Education  2017 [employer] Prevention in the Home Falls can cause injuries. They can happen to people of all ages. There are many things you can do to make your home safe and to help prevent falls. What can I do on the outside of my home?  Regularly fix the edges of walkways and driveways and fix any cracks.  Remove anything that might make you trip as you walk through a door, such as a raised step or threshold.  Trim any bushes or trees on the path to your home.  Use bright outdoor lighting.  Clear any walking paths of anything that might make someone trip, such as rocks or tools.  Regularly check to see if handrails are loose or broken. Make sure that both sides of any steps have handrails.  Any raised decks and porches should have guardrails on the edges.  Have any leaves, snow, or ice cleared regularly.  Use sand or salt on walking paths during winter.  Clean up any spills in your garage right away. This includes oil or grease spills. What can I do in the bathroom?  Use night lights.  Install grab bars by the toilet and in the tub and shower. Do not use towel bars as grab bars.  Use non-skid mats or decals in the tub or shower.  If you need to sit down in the shower, use a plastic, non-slip  stool.  Keep the floor dry. Clean up any water that spills on the floor as soon as it happens.  Remove soap buildup in the tub or shower regularly.  Attach bath mats securely with double-sided non-slip rug tape.  Do not have throw rugs and other things on the floor that can make you trip. What can I do in the bedroom?  Use night lights.  Make sure that you have a light by your bed that is easy to reach.  Do not use any sheets or blankets that are too big for your bed. They should not hang down onto the floor.  Have a firm chair that has side arms. You can use this for support while you get dressed.  Do not have throw rugs and other things on the floor that can make you trip. What can I do in the kitchen?  Clean up any spills right away.  Avoid walking on wet floors.  Keep items that you use a lot in easy-to-reach places.  If you need to reach something above you, use a strong step stool that has a grab bar.  Keep electrical cords out of the way.  Do not use floor polish or wax that makes  floors slippery. If you must use wax, use non-skid floor wax.  Do not have throw rugs and other things on the floor that can make you trip. What can I do with my stairs?  Do not leave any items on the stairs.  Make sure that there are handrails on both sides of the stairs and use them. Fix handrails that are broken or loose. Make sure that handrails are as long as the stairways.  Check any carpeting to make sure that it is firmly attached to the stairs. Fix any carpet that is loose or worn.  Avoid having throw rugs at the top or bottom of the stairs. If you do have throw rugs, attach them to the floor with carpet tape.  Make sure that you have a light switch at the top of the stairs and the bottom of the stairs. If you do not have them, ask someone to add them for you. What else can I do to help prevent falls?  Wear shoes that:  Do not have high heels.  Have rubber bottoms.  Are  comfortable and fit you well.  Are closed at the toe. Do not wear sandals.  If you use a stepladder:  Make sure that it is fully opened. Do not climb a closed stepladder.  Make sure that both sides of the stepladder are locked into place.  Ask someone to hold it for you, if possible.  Clearly mark and make sure that you can see:  Any grab bars or handrails.  First and last steps.  Where the edge of each step is.  Use tools that help you move around (mobility aids) if they are needed. These include:  Canes.  Walkers.  Scooters.  Crutches.  Turn on the lights when you go into a dark area. Replace any light bulbs as soon as they burn out.  Set up your furniture so you have a clear path. Avoid moving your furniture around.  If any of your floors are uneven, fix them.  If there are any pets around you, be aware of where they are.  Review your medicines with your doctor. Some medicines can make you feel dizzy. This can increase your chance of falling. Ask your doctor what other things that you can do to help prevent falls. This information is not intended to replace advice given to you by your health care provider. Make sure you discuss any questions you have with your health care provider. Document Released: 06/05/2009 Document Revised: 01/15/2016 Document Reviewed: 09/13/2014 Elsevier Interactive Patient Education  2017 [employer].  Free hearing clinics offered in [place]:   [address], [place], [place] [address] [phone]  [facility] [place] [address] [place], [place], [place] [address] [phone]

## 2018-05-04 ENCOUNTER — Ambulatory Visit: Payer: Medicare Other | Attending: Nurse Practitioner | Admitting: Nurse Practitioner

## 2018-05-04 ENCOUNTER — Encounter: Payer: Self-pay | Admitting: Nurse Practitioner

## 2018-05-04 ENCOUNTER — Other Ambulatory Visit: Payer: Self-pay

## 2018-05-04 VITALS — BP 101/69 | HR 69 | Temp 98.4°F | Resp 18 | Ht 65.0 in | Wt 152.0 lb

## 2018-05-04 DIAGNOSIS — J449 Chronic obstructive pulmonary disease, unspecified: Secondary | ICD-10-CM | POA: Insufficient documentation

## 2018-05-04 DIAGNOSIS — M25512 Pain in left shoulder: Secondary | ICD-10-CM | POA: Diagnosis present

## 2018-05-04 DIAGNOSIS — M7918 Myalgia, other site: Secondary | ICD-10-CM | POA: Insufficient documentation

## 2018-05-04 DIAGNOSIS — M5416 Radiculopathy, lumbar region: Secondary | ICD-10-CM | POA: Diagnosis not present

## 2018-05-04 DIAGNOSIS — T402X5A Adverse effect of other opioids, initial encounter: Secondary | ICD-10-CM

## 2018-05-04 DIAGNOSIS — G2581 Restless legs syndrome: Secondary | ICD-10-CM | POA: Diagnosis not present

## 2018-05-04 DIAGNOSIS — M25511 Pain in right shoulder: Secondary | ICD-10-CM | POA: Diagnosis present

## 2018-05-04 DIAGNOSIS — Z950 Presence of cardiac pacemaker: Secondary | ICD-10-CM | POA: Diagnosis not present

## 2018-05-04 DIAGNOSIS — G4700 Insomnia, unspecified: Secondary | ICD-10-CM

## 2018-05-04 DIAGNOSIS — M545 Low back pain: Secondary | ICD-10-CM | POA: Diagnosis present

## 2018-05-04 DIAGNOSIS — M5442 Lumbago with sciatica, left side: Secondary | ICD-10-CM

## 2018-05-04 DIAGNOSIS — Z853 Personal history of malignant neoplasm of breast: Secondary | ICD-10-CM | POA: Diagnosis not present

## 2018-05-04 DIAGNOSIS — M5137 Other intervertebral disc degeneration, lumbosacral region: Secondary | ICD-10-CM | POA: Diagnosis not present

## 2018-05-04 DIAGNOSIS — N183 Chronic kidney disease, stage 3 (moderate): Secondary | ICD-10-CM | POA: Diagnosis not present

## 2018-05-04 DIAGNOSIS — Z8614 Personal history of Methicillin resistant Staphylococcus aureus infection: Secondary | ICD-10-CM | POA: Insufficient documentation

## 2018-05-04 DIAGNOSIS — I129 Hypertensive chronic kidney disease with stage 1 through stage 4 chronic kidney disease, or unspecified chronic kidney disease: Secondary | ICD-10-CM | POA: Insufficient documentation

## 2018-05-04 DIAGNOSIS — K219 Gastro-esophageal reflux disease without esophagitis: Secondary | ICD-10-CM | POA: Insufficient documentation

## 2018-05-04 DIAGNOSIS — M48061 Spinal stenosis, lumbar region without neurogenic claudication: Secondary | ICD-10-CM | POA: Insufficient documentation

## 2018-05-04 DIAGNOSIS — I4891 Unspecified atrial fibrillation: Secondary | ICD-10-CM | POA: Diagnosis not present

## 2018-05-04 DIAGNOSIS — G894 Chronic pain syndrome: Secondary | ICD-10-CM | POA: Diagnosis not present

## 2018-05-04 DIAGNOSIS — M533 Sacrococcygeal disorders, not elsewhere classified: Secondary | ICD-10-CM | POA: Diagnosis not present

## 2018-05-04 DIAGNOSIS — Z79891 Long term (current) use of opiate analgesic: Secondary | ICD-10-CM | POA: Diagnosis not present

## 2018-05-04 DIAGNOSIS — K5903 Drug induced constipation: Secondary | ICD-10-CM | POA: Insufficient documentation

## 2018-05-04 DIAGNOSIS — G8929 Other chronic pain: Secondary | ICD-10-CM

## 2018-05-04 DIAGNOSIS — M5441 Lumbago with sciatica, right side: Secondary | ICD-10-CM

## 2018-05-04 MED ORDER — BISACODYL 5 MG PO TBEC: 10.0000 mg | DELAYED_RELEASE_TABLET | Freq: Every day | ORAL | 2 refills | Status: DC | PRN

## 2018-05-04 MED ORDER — OXYCODONE HCL 20 MG PO TABS: 20.0000 mg | ORAL_TABLET | Freq: Three times a day (TID) | ORAL | 0 refills | Status: DC | PRN

## 2018-05-04 MED ORDER — MELATONIN 10 MG PO CAPS: 20.0000 mg | ORAL_CAPSULE | Freq: Every evening | ORAL | 2 refills | Status: DC | PRN

## 2018-05-04 MED ORDER — ROPINIROLE HCL 2 MG PO TABS: ORAL_TABLET | ORAL | 2 refills | Status: DC

## 2018-05-04 NOTE — Patient Instructions (Addendum)
____________________________________________________________________________________________  Medication Rules  Applies to: All patients receiving prescriptions (written or electronic).  Pharmacy of record: Pharmacy where electronic prescriptions will be sent. If written prescriptions are taken to a different pharmacy, please inform the nursing staff. The pharmacy listed in the electronic medical record should be the one where you would like electronic prescriptions to be sent.  Prescription refills: Only during scheduled appointments. Applies to both, written and electronic prescriptions.  NOTE: The following applies primarily to controlled substances (Opioid* Pain Medications).   Patient's responsibilities: 1. Pain Pills: Bring all pain pills to every appointment (except for procedure appointments). 2. Pill Bottles: Bring pills in original pharmacy bottle. Always bring newest bottle. Bring bottle, even if empty. 3. Medication refills: You are responsible for knowing and keeping track of what medications you need refilled. The day before your appointment, write a list of all prescriptions that need to be refilled. Bring that list to your appointment and give it to the admitting nurse. Prescriptions will be written only during appointments. If you forget a medication, it will not be "Called in", "Faxed", or "electronically sent". You will need to get another appointment to get these prescribed. 4. Prescription Accuracy: You are responsible for carefully inspecting your prescriptions before leaving our office. Have the discharge nurse carefully go over each prescription with you, before taking them home. Make sure that your name is accurately spelled, that your address is correct. Check the name and dose of your medication to make sure it is accurate. Check the number of pills, and the written instructions to make sure they are clear and accurate. Make sure that you are given enough medication to last  until your next medication refill appointment. 5. Taking Medication: Take medication as prescribed. Never take more pills than instructed. Never take medication more frequently than prescribed. Taking less pills or less frequently is permitted and encouraged, when it comes to controlled substances (written prescriptions).  6. Inform other Doctors: Always inform, all of your healthcare providers, of all the medications you take. 7. Pain Medication from other Providers: You are not allowed to accept any additional pain medication from any other Doctor or Healthcare provider. There are two exceptions to this rule. (see below) In the event that you require additional pain medication, you are responsible for notifying us, as stated below. 8. Medication Agreement: You are responsible for carefully reading and following our Medication Agreement. This must be signed before receiving any prescriptions from our practice. Safely store a copy of your signed Agreement. Violations to the Agreement will result in no further prescriptions. (Additional copies of our Medication Agreement are available upon request.) 9. Laws, Rules, & Regulations: All patients are expected to follow all Federal and State Laws, Statutes, Rules, & Regulations. Ignorance of the Laws does not constitute a valid excuse. The use of any illegal substances is prohibited. 10. Adopted CDC guidelines & recommendations: Target dosing levels will be at or below 60 MME/day. Use of benzodiazepines** is not recommended.  Exceptions: There are only two exceptions to the rule of not receiving pain medications from other Healthcare Providers. 1. Exception #1 (Emergencies): In the event of an emergency (i.e.: accident requiring emergency care), you are allowed to receive additional pain medication. However, you are responsible for: As soon as you are able, call our office [phone], at any time of the day or night, and leave a message stating your name, the  date and nature of the emergency, and the name and dose of the medication   prescribed. In the event that your call is answered by a member of our staff, make sure to document and save the date, time, and the name of the person that took your information.  2. Exception #2 (Planned Surgery): In the event that you are scheduled by another doctor or dentist to have any type of surgery or procedure, you are allowed (for a period no longer than 30 days), to receive additional pain medication, for the acute post-op pain. However, in this case, you are responsible for picking up a copy of our "Post-op Pain Management for Surgeons" handout, and giving it to your surgeon or dentist. This document is available at our office, and does not require an appointment to obtain it. Simply go to our office during business hours (Monday-Thursday from 8:00 AM to 4:00 PM) (Friday 8:00 AM to 12:00 Noon) or if you have a scheduled appointment with [name], prior to your surgery, and ask for it by name. In addition, you will need to provide [name] with your name, name of your surgeon, type of surgery, and date of procedure or surgery.  *Opioid medications include: morphine, codeine, oxycodone, oxymorphone, hydrocodone, hydromorphone, meperidine, tramadol, tapentadol, buprenorphine, fentanyl, methadone. **Benzodiazepine medications include: diazepam (Valium), alprazolam (Xanax), clonazepam (Klonopine), lorazepam (Ativan), clorazepate (Tranxene), chlordiazepoxide (Librium), estazolam (Prosom), oxazepam (Serax), temazepam (Restoril), triazolam (Halcion) (Last updated: 10/20/2017) ____________________________________________________________________________________________   ____________________________________________________________________________________________  Drug Holidays (Slow)  What is a "Drug Holiday"? Drug Holiday: is the name given to the period of time during which a patient stops taking a medication(s) for the purpose of  eliminating tolerance to the drug.  Benefits . Improved effectiveness of opioids. . Decreased opioid dose needed to achieve benefits. . Improved pain with lesser dose.  What is tolerance? Tolerance: is the progressive decreased in effectiveness of a drug due to its repetitive use. With repetitive use, the body gets use to the medication and as a consequence, it loses its effectiveness. This is a common problem seen with opioid pain medications. As a result, a larger dose of the drug is needed to achieve the same effect that used to be obtained with a smaller dose.  How long should a "Drug Holiday" last? At least 14 consecutive days. (2 weeks)  What are withdrawals? Withdrawals: refers to the wide range of symptoms that occur after stopping or dramatically reducing opiate drugs after heavy and prolonged use. Withdrawal symptoms do not occur to patients that use low dose opioids, or those who take the medication sporadically. Contrary to benzodiazepine (example: Valium, Xanax, etc.) or alcohol withdrawals ("Delirium Tremens"), opioid withdrawals are not lethal. Withdrawals are the physical manifestation of the body getting rid of the excess receptors.  Expected Symptoms Early symptoms of withdrawal may include: . Agitation . Anxiety . Muscle aches . Increased tearing . Insomnia . Runny nose . Sweating . Yawning  Late symptoms of withdrawal may include: . Abdominal cramping . Diarrhea . Dilated pupils . Goose bumps . Nausea . Vomiting  Will I experience withdrawals? Due to the slow nature of the taper, it is very unlikely that you will experience any.  What is a slow taper? Taper: refers to the gradual decrease in dose. ___________________________________________________________________________________________

## 2018-05-04 NOTE — Progress Notes (Signed)
Patient's Name: Kristin [name]  MRN: [id number]  Referring Provider: Mikey College, *  DOB: November 27, 1929  PCP: Mikey College, NP  DOS: 05/04/2018  Note by: Vevelyn Francois NP  Service setting: Ambulatory outpatient  Specialty: Interventional Pain Management  Location: [facility] (AMB) Pain Management Facility    Patient type: Established    Primary Reason(s) for Visit: Encounter for prescription drug management & post-procedure evaluation of chronic illness with mild to moderate exacerbation(Level of risk: moderate) CC: Shoulder Pain (bilateral) and Back Pain (low)  HPI  Kristin [name] is a 84 y.o. year old, female patient, who comes today for a post-procedure evaluation and medication management. She has Chronic kidney disease (CKD), stage III (moderate) (HCC); DDD (degenerative disc disease), lumbosacral; Gout; Benign essential HTN; Lumbar central spinal stenosis; Chronic obstructive pulmonary disease ([place]); Chemical diabetes; History of cardiac catheterization; Seasonal allergies; History of breast cancer; Chronic pain syndrome; Chronic low back pain (Primary Source of Pain) (Bilateral) (L>R); Lumbar facet syndrome (Bilateral) (L>R); Lumbar spondylosis; Chronic lumbar radicular pain (Bilateral) (L>R) (L5 Dermatome); Restless leg syndrome; Myofascial pain; Neurogenic pain; Long term current use of opiate analgesic; Long term prescription opiate use; Opiate use (90 MME/Day); Opioid-induced constipation (OIC); Vitamin D insufficiency; Grade 1 Retrolisthesis of L1 over L2; Lumbar facet hypertrophy (L1-2, L2-3, and L4-5); Lumbar lateral recess stenosis (L1-2 and L4-5); 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); Epidural fibrosis; Lumbar foraminal stenosis (Severe Left L4-5); 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); Osteoporosis; Chronic sacroiliac joint pain (Left); History of methicillin resistant staphylococcus aureus (MRSA); Mitral valve prolapse; GERD (gastroesophageal  reflux disease); Chronic shoulder pain (S/P replacement) (Right); Osteoarthritis, multiple sites; Shoulder pain S/P prostheses (Right); Chronic diastolic CHF (congestive heart failure) ([place]); S/P cardiac pacemaker procedure; Anticoagulated; Hyperlipidemia; Chronic neck pain; Chronic hip pain (Bilateral); Chronic cervical radicular pain (Tertiary Area of Pain) (Bilateral) (L>R); Long term current use of anticoagulant; Chronic lower extremity pain (Secondary Area of Pain) (Bilateral) (L>R); DDD (degenerative disc disease), cervical; Numbness and tingling of upper extremity (Bilateral); Radicular pain of shoulder (Bilateral) (L>R); Cervical facet syndrome (Bilateral) (L>R); Cervical (3-4 mm) Anterolisthesis of C4 on C5; Cervical foraminal stenosis (Bilateral); Atrial fibrillation and flutter ([place]); Insomnia; Spondylosis without myelopathy or radiculopathy, lumbosacral region; Acute postoperative pain; and New onset atrial flutter (HCC) on their problem list. Her primarily concern today is the Shoulder Pain (bilateral) and Back Pain (low)  Pain Assessment: Location: Lower Back Radiating:   Onset: More than a month ago Duration: Chronic pain Quality: Aching, Constant Severity: 6 /10 (subjective, self-reported pain score)  Note: Reported level is compatible with observation. Clinically the patient looks like a 1/10 A 1/10 is viewed as "Mild" and described as nagging, annoying, but not interfering with basic activities of daily living (ADL). Kristin [name] is able to eat, bathe, get dressed, do toileting (being able to get on and off the toilet and perform personal hygiene functions), transfer (move in and out of bed or a chair without assistance), and maintain continence (able to control bladder and bowel functions). Physiologic parameters such as blood pressure and heart rate apear wnl. Kristin [name] does not seem to understand the use of our objective pain scale When using our objective Pain Scale, levels between 6  and 10/10 are said to belong in an emergency room, as it progressively worsens from a 6/10, described as severely limiting, requiring emergency care not usually available at an outpatient pain management facility. At a 6/10 level, communication becomes difficult and requires great effort. Assistance to  reach the emergency department may be required. Facial flushing and profuse sweating along with potentially dangerous increases in heart rate and blood pressure will be evident. Effect on ADL:   Timing: Constant Modifying factors: medications, rest BP: 101/69  HR: 69  Kristin [name] was last seen on 04/12/2018 for a procedure. During today's appointment we reviewed Kristin [name]'s post-procedure results, as well as her outpatient medication regimen.  He admits that she continues to have pain she does not feel like the injections are working for her.  She is requesting to have her medication restored to the previous dose.  Further details on both, my assessment(s), as well as the proposed treatment plan, please see below.  Controlled Substance Pharmacotherapy Assessment REMS (Risk Evaluation and Mitigation Strategy)  Analgesic:Oxycodone IR 20 mg every 8 hours (60 mg/day) MME/day:90 mg/day  Hart Rochester, RN  05/04/2018 11:30 AM  Sign at close encounter Nursing Pain Medication Assessment:  Safety precautions to be maintained throughout the outpatient stay will include: orient to surroundings, keep bed in low position, maintain call bell within reach at all times, provide assistance with transfer out of bed and ambulation.  Medication Inspection Compliance: Pill count conducted under aseptic conditions, in front of the patient. Neither the pills nor the bottle was removed from the patient's sight at any time. Once count was completed pills were immediately returned to the patient in their original bottle.  Medication: Oxycodone IR Pill/Patch Count: 7 of 90 pills remain Pill/Patch Appearance:  Markings consistent with prescribed medication Bottle Appearance: Standard pharmacy container. Clearly labeled. Filled Date: 08 / 16 / 2019 Last Medication intake:  Today   Pharmacokinetics: Liberation and absorption (onset of action): WNL Distribution (time to peak effect): WNL Metabolism and excretion (duration of action): WNL         Pharmacodynamics: Desired effects: Analgesia: Kristin [name] reports >50% benefit. Functional ability: Patient reports that medication allows her to accomplish basic ADLs Clinically meaningful improvement in function (CMIF): Sustained CMIF goals met Perceived effectiveness: Described as relatively effective but with some room for improvement Undesirable effects: Side-effects or Adverse reactions: None reported Monitoring: [place] PMP: Online review of the past 60-monthperiod conducted. Compliant with practice rules and regulations Last UDS on record: Summary  Date Value Ref Range Status  02/01/2018 FINAL  Final    Comment:    ==================================================================== TOXASSURE SELECT 13 (MW) ==================================================================== Test                             Result       Flag       Units Drug Present and Declared for Prescription Verification   Oxycodone                      401          EXPECTED   ng/mg creat   Oxymorphone                    438          EXPECTED   ng/mg creat   Noroxycodone                   1122         EXPECTED   ng/mg creat   Noroxymorphone                 309          EXPECTED  ng/mg creat    Sources of oxycodone are scheduled prescription medications.    Oxymorphone, noroxycodone, and noroxymorphone are expected    metabolites of oxycodone. Oxymorphone is also available as a    scheduled prescription medication. ==================================================================== Test                      Result    Flag   Units      Ref Range   Creatinine               79               mg/dL      >=20 ==================================================================== Declared Medications:  The flagging and interpretation on this report are based on the  following declared medications.  Unexpected results may arise from  inaccuracies in the declared medications.  **Note: The testing scope of this panel includes these medications:  Oxycodone  **Note: The testing scope of this panel does not include following  reported medications:  Albuterol (Proventil)  Apixaban  Bisacodyl  Furosemide (Lasix)  Lisinopril  Melatonin  Polyethylene Glycol (MiraLAX)  Ropinirole  Vitamin D ==================================================================== For clinical consultation, please call [phone]. ====================================================================    UDS interpretation: Compliant          Medication Assessment Form: Reviewed. Patient indicates being compliant with therapy Treatment compliance: Compliant Risk Assessment Profile: Aberrant behavior: See prior evaluations. None observed or detected today Comorbid factors increasing risk of overdose: See prior notes. No additional risks detected today Opioid risk tool (ORT) (Total Score): 0 Personal History of Substance Abuse (SUD-Substance use disorder):  Alcohol: Negative  Illegal Drugs: Negative  Rx Drugs: Negative  ORT Risk Level calculation: Low Risk Risk of substance use disorder (SUD): Low Opioid Risk Tool - 05/04/18 1129      Family History of Substance Abuse   Alcohol  Negative    Illegal Drugs  Negative    Rx Drugs  Negative      Personal History of Substance Abuse   Alcohol  Negative    Illegal Drugs  Negative    Rx Drugs  Negative      Age   Age between 22-45 years   No      History of Preadolescent Sexual Abuse   History of Preadolescent Sexual Abuse  Negative or Female      Psychological Disease   Psychological Disease  Negative    Depression  Negative       Total Score   Opioid Risk Tool Scoring  0    Opioid Risk Interpretation  Low Risk      ORT Scoring interpretation table:  Score <3 = Low Risk for SUD  Score between 4-7 = Moderate Risk for SUD  Score >8 = High Risk for Opioid Abuse   Risk Mitigation Strategies:  Patient Counseling: Covered Patient-Prescriber Agreement (PPA): Present and active  Notification to other healthcare providers: Done  Pharmacologic Plan: No change in therapy, at this time.             Post-Procedure Assessment  6/202019 Procedure: Lumbar facet radiofrequency Pre-procedure pain score:  6/10 Post-procedure pain score: 0/10         Influential Factors: BMI: 25.29 kg/m Intra-procedural challenges: None observed.         Assessment challenges: None detected.              Reported side-effects: None.        Post-procedural adverse reactions or complications: None  reported         Sedation: Please see nurses note. When no sedatives are used, the analgesic levels obtained are directly associated to the effectiveness of the local anesthetics. However, when sedation is provided, the level of analgesia obtained during the initial 1 hour following the intervention, is believed to be the result of a combination of factors. These factors may include, but are not limited to: 1. The effectiveness of the local anesthetics used. 2. The effects of the analgesic(s) and/or anxiolytic(s) used. 3. The degree of discomfort experienced by the patient at the time of the procedure. 4. The patients ability and reliability in recalling and recording the events. 5. The presence and influence of possible secondary gains and/or psychosocial factors. Reported result: Relief experienced during the 1st hour after the procedure: 50 % (Ultra-Short Term Relief)            Interpretative annotation: Clinically appropriate result. Analgesia during this period is likely to be Local Anesthetic and/or IV Sedative (Analgesic/Anxiolytic) related.           Effects of local anesthetic: The analgesic effects attained during this period are directly associated to the localized infiltration of local anesthetics and therefore cary significant diagnostic value as to the etiological location, or anatomical origin, of the pain. Expected duration of relief is directly dependent on the pharmacodynamics of the local anesthetic used. Long-acting (4-6 hours) anesthetics used.  Reported result: Relief during the next 4 to 6 hour after the procedure: 50 % (Short-Term Relief)            Interpretative annotation: Clinically appropriate result. Analgesia during this period is likely to be Local Anesthetic-related.          Long-term benefit: Defined as the period of time past the expected duration of local anesthetics (1 hour for short-acting and 4-6 hours for long-acting). With the possible exception of prolonged sympathetic blockade from the local anesthetics, benefits during this period are typically attributed to, or associated with, other factors such as analgesic sensory neuropraxia, antiinflammatory effects, or beneficial biochemical changes provided by agents other than the local anesthetics.  Reported result: Extended relief following procedure: 0 %(only last about 2-3 days) (Long-Term Relief)            Interpretative annotation: Clinically possible results. Good relief. No permanent benefit expected. Inflammation plays a part in the etiology to the pain.          Current benefits: Defined as reported results that persistent at this point in time.   Analgesia: 0 %            Function: No benefit ROM: No benefit Interpretative annotation: Recurrence of symptoms.    Results would argue against repeating therapy.          Interpretation: Results would suggest failure of therapy in achieving desired goal(s).                  Plan:  Please see "Plan of Care" for details.                Laboratory Chemistry  Inflammation Markers (CRP: Acute Phase)  (ESR: Chronic Phase) Lab Results  Component Value Date   CRP <0.5 09/24/2015   ESRSEDRATE 12 09/24/2015                         Rheumatology Markers No results found for: RF, ANA, [place], [place], [place], [place], HLAB27  Renal Function Markers Lab Results  Component Value Date   BUN 19 08/22/2017   CREATININE 1.09 (H) 08/22/2017   GFRAA 52 (L) 08/22/2017   GFRNONAA 45 (L) 08/22/2017                             Hepatic Function Markers Lab Results  Component Value Date   AST 23 08/22/2017   ALT 11 (L) 08/22/2017   ALBUMIN 3.6 08/22/2017   ALKPHOS 60 08/22/2017                        Electrolytes Lab Results  Component Value Date   NA 139 08/22/2017   K 4.9 08/22/2017   CL 107 08/22/2017   CALCIUM 9.7 08/22/2017   MG 2.2 09/24/2015                        Neuropathy Markers No results found for: VITAMINB12, FOLATE, HGBA1C, HIV                      CNS Tests No results found for: COLORCSF, APPEARCSF, RBCCOUNTCSF, WBCCSF, POLYSCSF, LYMPHSCSF, EOSCSF, PROTEINCSF, GLUCCSF, JCVIRUS, CSFOLI, IGGCSF                      Bone Pathology Markers No results found for: VD25OH, [id number], [id number], [id number], 25OHVITD1, 25OHVITD2, 25OHVITD3, TESTOFREE, TESTOSTERONE                       Coagulation Parameters Lab Results  Component Value Date   INR 1.24 12/16/2015   LABPROT 15.8 (H) 12/16/2015   APTT 26 12/16/2015   PLT 139 (L) 08/15/2017                        Cardiovascular Markers Lab Results  Component Value Date   TROPONINI <0.03 08/14/2017   HGB 11.6 (L) 08/15/2017   HCT 34.8 (L) 08/15/2017                         CA Markers Lab Results  Component Value Date   LABCA2 49.4 (H) 07/10/2013                        Note: Lab results reviewed.  Recent Diagnostic Imaging Results  DG C-Arm 1-60 Min-No Report Fluoroscopy was utilized by the requesting physician.  No radiographic  interpretation.   Complexity Note: Imaging  results reviewed. Results shared with Kristin [name], using Layman's terms.                         Meds   Current Outpatient Medications:  .  albuterol (PROVENTIL) (2.5 MG/3ML) 0.083% nebulizer solution, Take 3 mLs (2.5 mg total) by nebulization every 8 (eight) hours as needed. Shortness of breath and wheezing, Disp: 75 mL, Rfl: 12 .  apixaban (ELIQUIS) 5 MG TABS tablet, Take 1 tablet (5 mg total) by mouth 2 (two) times daily., Disp: 60 tablet, Rfl: 0 .  Cholecalciferol (VITAMIN D) 2000 units tablet, Take 2,000 Units by mouth daily. , Disp: , Rfl:  .  furosemide (LASIX) 20 MG tablet, Take 0.5 tablets (10 mg total) by mouth daily., Disp: 45 tablet, Rfl: 1 .  levocetirizine (XYZAL) 5 MG tablet, Take 1 tablet (5 mg total)  by mouth daily., Disp: 30 tablet, Rfl: 5 .  lisinopril (PRINIVIL,ZESTRIL) 10 MG tablet, Take 1 tablet (10 mg total) by mouth daily., Disp: 90 tablet, Rfl: 1 .  Melatonin 10 MG CAPS, Take 20 mg by mouth at bedtime as needed., Disp: 60 capsule, Rfl: 2 .  nystatin (MYCOSTATIN) 100000 UNIT/ML suspension, , Disp: , Rfl: 5 .  [START ON 06/06/2018] Oxycodone HCl 20 MG TABS, Take 1 tablet (20 mg total) by mouth every 8 (eight) hours as needed., Disp: 90 tablet, Rfl: 0 .  OXYGEN, Inhale 2 L into the lungs at bedtime. , Disp: , Rfl:  .  polyethylene glycol (MIRALAX / GLYCOLAX) packet, Take 17 g by mouth daily as needed for moderate constipation., Disp: 14 each, Rfl: 2 .  rOPINIRole (REQUIP) 2 MG tablet, Take 2 tablets at bedtime, Disp: 60 tablet, Rfl: 2 .  bisacodyl (DULCOLAX) 5 MG EC tablet, Take 2 tablets (10 mg total) by mouth daily as needed for moderate constipation., Disp: 60 tablet, Rfl: 2 .  [START ON 05/06/2018] Oxycodone HCl 20 MG TABS, Take 1 tablet (20 mg total) by mouth every 8 (eight) hours as needed., Disp: 90 tablet, Rfl: 0 .  [START ON 07/06/2018] Oxycodone HCl 20 MG TABS, Take 1 tablet (20 mg total) by mouth every 8 (eight) hours as needed., Disp: 90 tablet, Rfl: 0  ROS   Constitutional: Denies any fever or chills Gastrointestinal: No reported hemesis, hematochezia, vomiting, or acute GI distress Musculoskeletal: Denies any acute onset joint swelling, redness, loss of ROM, or weakness Neurological: No reported episodes of acute onset apraxia, aphasia, dysarthria, agnosia, amnesia, paralysis, loss of coordination, or loss of consciousness  Allergies  Kristin [name] is allergic to flexeril [cyclobenzaprine hcl] and vancomycin.  PFSH  Drug: Kristin [name]  reports that she does not use drugs. Alcohol:  reports that she does not drink alcohol. Tobacco:  reports that she quit smoking about 19 years ago. Her smoking use included cigarettes. She has a 50.00 pack-year smoking history. She has never used smokeless tobacco. Medical:  has a past medical history of Arrhythmia, sinus node (04/03/2014), Arthritis, Arthritis, Atrial fibrillation ([place]), Back pain, Bradycardia, Breast cancer ([place]) (2004), Cancer ([place]) (2004), CHF (congestive heart failure) ([place]), Chronic back pain, CKD (chronic kidney disease), COPD (chronic obstructive pulmonary disease) ([place]), Cystocele, Decubitus ulcers, Dehydration, Gout, Hematuria, Hepatitis C, Hyperlipidemia, Hypertension, Hypomagnesemia (06/25/2015), OAB (overactive bladder), Overactive bladder, Restless leg, Shoulder pain, bilateral, Urinary frequency, UTI (lower urinary tract infection), and Vaginal atrophy. Surgical: Kristin [name]  has a past surgical history that includes Abdominal hysterectomy; Back surgery; Rotator cuff repair; Breast surgery; Cataract extraction w/PHACO (10/19/2011); Cataract extraction w/PHACO (11/02/2011); Carpal tunnel release; Joint replacement; Colon surgery; Pacemaker insertion (N/A, 12/24/2015); Cholecystectomy; Breast lumpectomy (Right, 2004); and Insert / replace / remove pacemaker. Family: family history includes Breast cancer in her paternal aunt; Breast cancer (age of onset: 85) in her sister; Cancer in her sister; Heart  disease in her father and mother; Kidney disease in her brother; Stroke in her mother.  Constitutional Exam  General appearance: Well nourished, well developed, and well hydrated. In no apparent acute distress Vitals:   05/04/18 1126  BP: 101/69  Pulse: 69  Resp: 18  Temp: 98.4 F (36.9 C)  TempSrc: Oral  SpO2: 98%  Weight: 152 lb (68.9 kg)  Height: '5\' 5"'  (1.651 m)   BMI Assessment: Estimated body mass index is 25.29 kg/m as calculated from the following:   Height as of this encounter: '5\' 5"'  (1.651  m).   Weight as of this encounter: 152 lb (68.9 kg). Psych/Mental status: Alert, oriented x 3 (person, place, & time)       Eyes: PERLA Respiratory: No evidence of acute respiratory distress   Lumbar Spine Area Exam  Skin & Axial Inspection: No masses, redness, or swelling Alignment: Symmetrical Functional ROM: Unrestricted ROM       Stability: No instability detected Muscle Tone/Strength: Functionally intact. No obvious neuro-muscular anomalies detected. Sensory (Neurological): Unimpaired Palpation: Tender       Provocative Tests: Hyperextension/rotation test: deferred today       Lumbar quadrant test (Kemp's test): deferred today       Lateral bending test: deferred today       Patrick's Maneuver: deferred today                   FABER test: deferred today                   S-I anterior distraction/compression test: deferred today         S-I lateral compression test: deferred today         S-I Thigh-thrust test: deferred today         S-I Gaenslen's test: deferred today          Gait & Posture Assessment  Ambulation: Unassisted Gait: Relatively normal for age and body habitus Posture: WNL   Lower Extremity Exam    Side: Right lower extremity  Side: Left lower extremity  Stability: No instability observed          Stability: No instability observed          Skin & Extremity Inspection: Skin color, temperature, and hair growth are WNL. No peripheral edema or cyanosis.  No masses, redness, swelling, asymmetry, or associated skin lesions. No contractures.  Skin & Extremity Inspection: Skin color, temperature, and hair growth are WNL. No peripheral edema or cyanosis. No masses, redness, swelling, asymmetry, or associated skin lesions. No contractures.  Functional ROM: Unrestricted ROM                  Functional ROM: Unrestricted ROM                  Muscle Tone/Strength: Functionally intact. No obvious neuro-muscular anomalies detected.  Muscle Tone/Strength: Functionally intact. No obvious neuro-muscular anomalies detected.  Sensory (Neurological): Unimpaired  Sensory (Neurological): Unimpaired  Palpation: No palpable anomalies  Palpation: No palpable anomalies   Assessment  Primary Diagnosis & Pertinent Problem List: The primary encounter diagnosis was Chronic sacroiliac joint pain (Left). Diagnoses of Chronic lumbar radicular pain (Bilateral) (L>R) (L5 Dermatome), Chronic low back pain (Primary Source of Pain) (Bilateral) (L>R), Chronic pain syndrome, Opioid-induced constipation (OIC), Insomnia, unspecified type, and Restless leg syndrome were also pertinent to this visit.  Status Diagnosis  Persistent Not responding Not responding 1. Chronic sacroiliac joint pain (Left)   2. Chronic lumbar radicular pain (Bilateral) (L>R) (L5 Dermatome)   3. Chronic low back pain (Primary Source of Pain) (Bilateral) (L>R)   4. Chronic pain syndrome   5. Opioid-induced constipation (OIC)   6. Insomnia, unspecified type   7. Restless leg syndrome     Problems updated and reviewed during this visit: Problem  New Onset Atrial Flutter ([facility])   Plan of Care  Pharmacotherapy (Medications Ordered): Meds ordered this encounter  Medications  . bisacodyl (DULCOLAX) 5 MG EC tablet    Sig: Take 2 tablets (10 mg total) by mouth daily as  needed for moderate constipation.    Dispense:  60 tablet    Refill:  2    Do not place this medication, or any other prescription from our  practice, on "Automatic Refill". Patient may have prescription filled one day early if pharmacy is closed on scheduled refill date.    Order Specific Question:   Supervising Provider    Answer:   Milinda Pointer [phone]  . Oxycodone HCl 20 MG TABS    Sig: Take 1 tablet (20 mg total) by mouth every 8 (eight) hours as needed.    Dispense:  90 tablet    Refill:  0    Do not add this medication to the electronic "Automatic Refill" notification system. Patient may have prescription filled one day early if pharmacy is closed on scheduled refill date.    Order Specific Question:   Supervising Provider    Answer:   Milinda Pointer [phone]  . Oxycodone HCl 20 MG TABS    Sig: Take 1 tablet (20 mg total) by mouth every 8 (eight) hours as needed.    Dispense:  90 tablet    Refill:  0    Do not add this medication to the electronic "Automatic Refill" notification system. Patient may have prescription filled one day early if pharmacy is closed on scheduled refill date.    Order Specific Question:   Supervising Provider    Answer:   Milinda Pointer [phone]  . Oxycodone HCl 20 MG TABS    Sig: Take 1 tablet (20 mg total) by mouth every 8 (eight) hours as needed.    Dispense:  90 tablet    Refill:  0    Do not add this medication to the electronic "Automatic Refill" notification system. Patient may have prescription filled one day early if pharmacy is closed on scheduled refill date.    Order Specific Question:   Supervising Provider    Answer:   Milinda Pointer [phone]  . Melatonin 10 MG CAPS    Sig: Take 20 mg by mouth at bedtime as needed.    Dispense:  60 capsule    Refill:  2    Do not place this medication, or any other prescription from our practice, on "Automatic Refill". Patient may have prescription filled one day early if pharmacy is closed on scheduled refill date.    Order Specific Question:   Supervising Provider    Answer:   Milinda Pointer [phone]  . rOPINIRole  (REQUIP) 2 MG tablet    Sig: Take 2 tablets at bedtime    Dispense:  60 tablet    Refill:  2    Do not place this medication, or any other prescription from our practice, on "Automatic Refill". Patient may have prescription filled one day early if pharmacy is closed on scheduled refill date.    Order Specific Question:   Supervising Provider    Answer:   Milinda Pointer [[id number]]   New Prescriptions   No medications on file   Medications administered today: [name] had no medications administered during this visit. Lab-work, procedure(s), and/or referral(s): No orders of the defined types were placed in this encounter.  Imaging and/or referral(s): None  Interventional management options: Planned, scheduled, and/or pending: Not at this time.   Considering:  Diagnostic bilateral sacroiliac joint block Possible bilateral sacroiliac joint RFA Diagnostic bilateral intra-articular hip joint injection Diagnostic bilateral femoral nerve + obturator nerve block Possible bilateral femoral nerve + obturator nerve RFA Possible bilateral lumbar facet  radiofrequency. (Left side done on 12/13/2016) Palliative bilateral lumbar facet block  Diagnostic right intra-articular shoulder joint injection Diagnostic right suprascapular nerve block Possible right suprascapular radiofrequencyablation.    Palliative PRN treatment(s):  Palliative bilateral lumbar facet block Diagnostic right intra-articular shoulder joint injection  Diagnostic right suprascapular nerve block Possible right suprascapular radiofrequencyablation    Provider-requested follow-up: Return in about 3 months (around 08/03/2018) for MedMgmt with Me Donella Stade [facility] Pace).  Future Appointments  Date Time Provider [place]  05/29/2018 10:15 AM Milinda Pointer, MD [facility] None  07/21/2018 11:00 AM Mikey College, NP [facility] None  08/02/2018 11:00 AM Vevelyn Francois, NP [facility] None   05/08/2019 10:45 AM [place] ADVISOR [facility] None   Primary Care Physician: Mikey College, NP Location: [facility] Note by: Vevelyn Francois NP Date: 05/04/2018; Time: 3:57 PM  Pain Score Disclaimer: We use the NRS-11 scale. This is a self-reported, subjective measurement of pain severity with only modest accuracy. It is used primarily to identify changes within a particular patient. It must be understood that outpatient pain scales are significantly less accurate that those used for research, where they can be applied under ideal controlled circumstances with minimal exposure to variables. In reality, the score is likely to be a combination of pain intensity and pain affect, where pain affect describes the degree of emotional arousal or changes in action readiness caused by the sensory experience of pain. Factors such as social and work situation, setting, emotional state, anxiety levels, expectation, and prior pain experience may influence pain perception and show large inter-individual differences that may also be affected by time variables.  Patient instructions provided during this appointment: Patient Instructions  ____________________________________________________________________________________________  Medication Rules  Applies to: All patients receiving prescriptions (written or electronic).  Pharmacy of record: Pharmacy where electronic prescriptions will be sent. If written prescriptions are taken to a different pharmacy, please inform the nursing staff. The pharmacy listed in the electronic medical record should be the one where you would like electronic prescriptions to be sent.  Prescription refills: Only during scheduled appointments. Applies to both, written and electronic prescriptions.  NOTE: The following applies primarily to controlled substances (Opioid* Pain Medications).   Patient's responsibilities: 1. Pain Pills:  Bring all pain pills to every appointment (except for procedure appointments). 2. Pill Bottles: Bring pills in original pharmacy bottle. Always bring newest bottle. Bring bottle, even if empty. 3. Medication refills: You are responsible for knowing and keeping track of what medications you need refilled. The day before your appointment, write a list of all prescriptions that need to be refilled. Bring that list to your appointment and give it to the admitting nurse. Prescriptions will be written only during appointments. If you forget a medication, it will not be "Called in", "Faxed", or "electronically sent". You will need to get another appointment to get these prescribed. 4. Prescription Accuracy: You are responsible for carefully inspecting your prescriptions before leaving our office. Have the discharge nurse carefully go over each prescription with you, before taking them home. Make sure that your name is accurately spelled, that your address is correct. Check the name and dose of your medication to make sure it is accurate. Check the number of pills, and the written instructions to make sure they are clear and accurate. Make sure that you are given enough medication to last until your next medication refill appointment. 5. Taking Medication: Take medication as prescribed. Never take more pills than instructed. Never take  medication more frequently than prescribed. Taking less pills or less frequently is permitted and encouraged, when it comes to controlled substances (written prescriptions).  6. Inform other Doctors: Always inform, all of your healthcare providers, of all the medications you take. 7. Pain Medication from other Providers: You are not allowed to accept any additional pain medication from any other Doctor or Healthcare provider. There are two exceptions to this rule. (see below) In the event that you require additional pain medication, you are responsible for notifying us, as stated  below. 8. Medication Agreement: You are responsible for carefully reading and following our Medication Agreement. This must be signed before receiving any prescriptions from our practice. Safely store a copy of your signed Agreement. Violations to the Agreement will result in no further prescriptions. (Additional copies of our Medication Agreement are available upon request.) 9. Laws, Rules, & Regulations: All patients are expected to follow all Federal and [employer], [employer], Rules, [employer]. Ignorance of the Laws does not constitute a valid excuse. The use of any illegal substances is prohibited. 10. Adopted CDC guidelines & recommendations: Target dosing levels will be at or below 60 MME/day. Use of benzodiazepines** is not recommended.  Exceptions: There are only two exceptions to the rule of not receiving pain medications from other Healthcare Providers. 1. Exception #1 (Emergencies): In the event of an emergency (i.e.: accident requiring emergency care), you are allowed to receive additional pain medication. However, you are responsible for: As soon as you are able, call our office [phone], at any time of the day or night, and leave a message stating your name, the date and nature of the emergency, and the name and dose of the medication prescribed. In the event that your call is answered by a member of our staff, make sure to document and save the date, time, and the name of the person that took your information.  2. Exception #2 (Planned Surgery): In the event that you are scheduled by another doctor or dentist to have any type of surgery or procedure, you are allowed (for a period no longer than 30 days), to receive additional pain medication, for the acute post-op pain. However, in this case, you are responsible for picking up a copy of our "Post-op Pain Management for Surgeons" handout, and giving it to your surgeon or dentist. This document is available at our office, and does not  require an appointment to obtain it. Simply go to our office during business hours (Monday-Thursday from 8:00 AM to 4:00 PM) (Friday 8:00 AM to 12:00 Noon) or if you have a scheduled appointment with [name], prior to your surgery, and ask for it by name. In addition, you will need to provide [name] with your name, name of your surgeon, type of surgery, and date of procedure or surgery.  *Opioid medications include: morphine, codeine, oxycodone, oxymorphone, hydrocodone, hydromorphone, meperidine, tramadol, tapentadol, buprenorphine, fentanyl, methadone. **Benzodiazepine medications include: diazepam (Valium), alprazolam (Xanax), clonazepam (Klonopine), lorazepam (Ativan), clorazepate (Tranxene), chlordiazepoxide (Librium), estazolam (Prosom), oxazepam (Serax), temazepam (Restoril), triazolam (Halcion) (Last updated: 10/20/2017) ____________________________________________________________________________________________   ____________________________________________________________________________________________  Drug Holidays (Slow)  What is a "Drug Holiday"? Drug Holiday: is the name given to the period of time during which a patient stops taking a medication(s) for the purpose of eliminating tolerance to the drug.  Benefits . Improved effectiveness of opioids. . Decreased opioid dose needed to achieve benefits. . Improved pain with lesser dose.  What is tolerance? Tolerance: is the progressive decreased in effectiveness of  a drug due to its repetitive use. With repetitive use, the body gets use to the medication and as a consequence, it loses its effectiveness. This is a common problem seen with opioid pain medications. As a result, a larger dose of the drug is needed to achieve the same effect that used to be obtained with a smaller dose.  How long should a "Drug Holiday" last? At least 14 consecutive days. (2 weeks)  What are withdrawals? Withdrawals: refers to the wide range of symptoms that  occur after stopping or dramatically reducing opiate drugs after heavy and prolonged use. Withdrawal symptoms do not occur to patients that use low dose opioids, or those who take the medication sporadically. Contrary to benzodiazepine (example: Valium, Xanax, etc.) or alcohol withdrawals ("Delirium Tremens"), opioid withdrawals are not lethal. Withdrawals are the physical manifestation of the body getting rid of the excess receptors.  Expected Symptoms Early symptoms of withdrawal may include: . Agitation . Anxiety . Muscle aches . Increased tearing . Insomnia . Runny nose . Sweating . Yawning  Late symptoms of withdrawal may include: . Abdominal cramping . Diarrhea . Dilated pupils . Goose bumps . Nausea . Vomiting  Will I experience withdrawals? Due to the slow nature of the taper, it is very unlikely that you will experience any.  What is a slow taper? Taper: refers to the gradual decrease in dose. ___________________________________________________________________________________________

## 2018-05-04 NOTE — Progress Notes (Signed)
Nursing Pain Medication Assessment:  Safety precautions to be maintained throughout the outpatient stay will include: orient to surroundings, keep bed in low position, maintain call bell within reach at all times, provide assistance with transfer out of bed and ambulation.  Medication Inspection Compliance: Pill count conducted under aseptic conditions, in front of the patient. Neither the pills nor the bottle was removed from the patient's sight at any time. Once count was completed pills were immediately returned to the patient in their original bottle.  Medication: Oxycodone IR Pill/Patch Count: 7 of 90 pills remain Pill/Patch Appearance: Markings consistent with prescribed medication Bottle Appearance: Standard pharmacy container. Clearly labeled. Filled Date: 08 / 16 / 2019 Last Medication intake:  Today

## 2018-05-28 NOTE — Progress Notes (Signed)
Patient's Name: Kristin [name]  MRN: [id number]  Referring Provider: Mikey College, *  DOB: 07-05-1930  PCP: Mikey College, NP  DOS: 05/29/2018  Note by: Gaspar Cola, MD  Service setting: Ambulatory outpatient  Specialty: Interventional Pain Management  Location: [facility] (AMB) Pain Management Facility    Patient type: Established   Primary Reason(s) for Visit: Evaluation of chronic illnesses with exacerbation, or progression (Level of risk: moderate) CC: Shoulder Pain (bilateral) and Back Pain (low)  HPI  [name] is a 84 y.o. year old, female patient, who comes today for a follow-up evaluation. She has Chronic kidney disease (CKD), stage III (moderate) (HCC); DDD (degenerative disc disease), lumbosacral; Gout; Benign essential HTN; Lumbar central spinal stenosis; Chronic obstructive pulmonary disease ([place]); Chemical diabetes; History of cardiac catheterization; Seasonal allergies; History of breast cancer; Chronic pain syndrome; Chronic low back pain (Primary Source of Pain) (Bilateral) (L>R); Lumbar facet syndrome (Bilateral) (L>R); Lumbar spondylosis; Chronic lumbar radicular pain (Bilateral) (L>R) (L5 Dermatome); Restless leg syndrome; Myofascial pain; Neurogenic pain; Long term current use of opiate analgesic; Long term prescription opiate use; Opiate use (90 MME/Day); Opioid-induced constipation (OIC); Vitamin D insufficiency; Grade 1 Retrolisthesis of L1 over L2; Lumbar facet hypertrophy (L1-2, L2-3, and L4-5); Lumbar lateral recess stenosis (L1-2 and L4-5); 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); Epidural fibrosis; Lumbar foraminal stenosis (Severe Left L4-5); 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); Osteoporosis; Chronic sacroiliac joint pain (Left); History of methicillin resistant staphylococcus aureus (MRSA); Mitral valve prolapse; GERD (gastroesophageal reflux disease); Osteoarthritis, multiple sites; Chronic diastolic CHF (congestive heart  failure) ([place]); S/P cardiac pacemaker procedure; Anticoagulated; Hyperlipidemia; Chronic neck pain; Chronic hip pain (Bilateral); Chronic cervical radicular pain (Tertiary Area of Pain) (Bilateral) (L>R); Chronic anticoagulation (Eliquis); Chronic lower extremity pain (Secondary Area of Pain) (Bilateral) (L>R); DDD (degenerative disc disease), cervical; Numbness and tingling of upper extremity (Bilateral); Radicular pain of shoulder (Bilateral) (L>R); Cervical facet syndrome (Bilateral) (L>R); Cervical (3-4 mm) Anterolisthesis of C4 on C5; Cervical foraminal stenosis (Bilateral); Atrial fibrillation and flutter ([place]); Insomnia; Spondylosis without myelopathy or radiculopathy, lumbosacral region; Acute postoperative pain; New onset atrial flutter ([place]); Chronic shoulders pain (Bilateral) (L>R); Osteoarthritis of shoulder (Bilateral); Osteoarthritis of hip (Bilateral); Chronic shoulder pain after shoulder replacement (Bilateral) (L>R); and Trigger finger, left middle finger on their problem list. [name] was last seen on 02/09/2018. Her primarily concern today is the Shoulder Pain (bilateral) and Back Pain (low)  Pain Assessment: Location: Lower Back Radiating: bilateral shoulder Onset: More than a month ago Duration: Chronic pain Quality: Aching, Constant Severity: 5 /10 (subjective, self-reported pain score)  Note: Reported level is inconsistent with clinical observations. Clinically the patient looks like a 2/10 A 2/10 is viewed as "Mild to Moderate" and described as noticeable and distracting. Impossible to hide from other people. More frequent flare-ups. Still possible to adapt and function close to normal. It can be very annoying and may have occasional stronger flare-ups. With discipline, patients may get used to it and adapt. [name] does not seem to understand the use of our objective pain scale When using our objective Pain Scale, levels between 6 and 10/10 are said to belong in an emergency  room, as it progressively worsens from a 6/10, described as severely limiting, requiring emergency care not usually available at an outpatient pain management facility. At a 6/10 level, communication becomes difficult and requires great effort. Assistance to reach the emergency department may be required. Facial flushing and profuse sweating along with potentially dangerous increases  in heart rate and blood pressure will be evident. Timing: Constant Modifying factors: rest, medications BP: 129/78  HR: 63  Further details on both, my assessment(s), as well as the proposed treatment plan, please see below.  Laboratory Chemistry  Inflammation Markers (CRP: Acute Phase) (ESR: Chronic Phase) Lab Results  Component Value Date   CRP <0.5 09/24/2015   ESRSEDRATE 12 09/24/2015                         Renal Function Markers Lab Results  Component Value Date   BUN 19 08/22/2017   CREATININE 1.09 (H) 08/22/2017   GFRAA 52 (L) 08/22/2017   GFRNONAA 45 (L) 08/22/2017                             Hepatic Function Markers Lab Results  Component Value Date   AST 23 08/22/2017   ALT 11 (L) 08/22/2017   ALBUMIN 3.6 08/22/2017   ALKPHOS 60 08/22/2017                        Electrolytes Lab Results  Component Value Date   NA 139 08/22/2017   K 4.9 08/22/2017   CL 107 08/22/2017   CALCIUM 9.7 08/22/2017   MG 2.2 09/24/2015                        Neuropathy Markers No results found.  CNS Tests No results found.  Bone Pathology Markers No results found.  Coagulation Parameters Lab Results  Component Value Date   INR 1.24 12/16/2015   LABPROT 15.8 (H) 12/16/2015   APTT 26 12/16/2015   PLT 139 (L) 08/15/2017                        Cardiovascular Markers Lab Results  Component Value Date   TROPONINI <0.03 08/14/2017   HGB 11.6 (L) 08/15/2017   HCT 34.8 (L) 08/15/2017                         CA Markers Lab Results  Component Value Date   LABCA2 49.4 (H) 07/10/2013                         Note: Lab results reviewed.  Recent Diagnostic Imaging Review  Cervical Imaging: Cervical DG complete:  Results for orders placed during the hospital encounter of 03/01/17  DG Cervical Spine Complete   Narrative CLINICAL DATA:  Pt reports neck pain radiating to left shoulder x 1 week. Denies injury. Denies hx of the same  EXAM: CERVICAL SPINE - COMPLETE 4+ VIEW  COMPARISON:  None.  FINDINGS: Disc desiccations at the C5-6 and C6-7 levels, moderate in degree, with associated disc space narrowings and mild osseous spurring. Associated osseous neural foramen narrowings noted at multiple levels.  3-4 mm anterolisthesis of C4 on C5, likely related to the underlying degenerative change, appearing slightly more pronounced on the swimmer's view. Also mild scoliosis.  No acute or suspicious osseous finding. No fracture line or displaced fracture fragment seen.  Bilateral carotid atherosclerosis. Visualized paravertebral soft tissues are otherwise unremarkable.  IMPRESSION: 1. Degenerative changes of the mid and lower cervical spine, mild to moderate in degree, with associated osseous neural foramen narrowings with possible associated nerve root impingement. If symptomatic could be radiculopathic  in nature, would consider nonemergent cervical spine MRI for further characterization. 2. 3-4 mm anterolisthesis of C4 on C5, likely related to the underlying degenerative change. This anterolisthesis appears slightly more pronounced on the swimmer's view raising the possibility of an associated instability at this level. Consider further plain film evaluation with flexion and extension views. 3. No acute appearing findings.   Electronically Signed   By: Franki Cabot M.D.   On: 03/01/2017 15:38    Shoulder Imaging: Shoulder-L CT wo contrast:  Results for orders placed during the hospital encounter of 01/04/11  CT Shoulder Left Wo Contrast   Narrative *RADIOLOGY  REPORT*  Clinical Data: Left shoulder pain for several months.  Weakness.  CT OF THE LEFT SHOULDER WITHOUT CONTRAST  Technique:  Multidetector CT imaging was performed according to the standard protocol. Multiplanar CT image reconstructions were also generated.  Comparison: None.  Findings: The scan demonstrates severe osteoarthritis of the glenohumeral joint with subchondral cysts and extensive erosions of the humeral head and glenoid.  There is marked narrowing of the space between the acromion and humeral head consistent with a chronic full-thickness rotator cuff tear.   There is severe atrophy of the infraspinatus and supraspinatus muscles.  There is a type 3 acromion with erosions of the distal clavicle.  Note is made of emphysematous disease in the left lung.  There is also a 6.4 mm nodule in the left upper lobe along the major fissure.  This could be an intrapulmonary lymph node but I cannot exclude a lung neoplasm.  I recommend a follow-up chest CT scan in 3-6 months without contrast for further evaluation of this nodule.  Note is made of extensive coronary artery calcification.  IMPRESSION:  1.  Severe osteoarthritis of the left glenohumeral joint with a chronic rotator cuff tear and severe atrophy of the infraspinatus and supraspinatus muscles. 2.  6 mm nodule in the left lung along the major fissure, possibly an intrapulmonary lymph node.  However, I cannot exclude a lung neoplasm.  Follow-up CT scan without contrast in 3-6 months is recommended. 3.  Emphysematous disease. 4.  Extensive coronary artery calcifications.  Original Report Authenticated By: Larey Seat, M.D.   Shoulder-L DG:  Results for orders placed during the hospital encounter of 08/22/16  DG Shoulder Left   Narrative CLINICAL DATA:  Shoulder pain after taking down Christmas lights.  EXAM: LEFT SHOULDER - 2+ VIEW  COMPARISON:  LEFT shoulder radiograph February 19, 2011  FINDINGS: Bony fragments project in the inferior glenohumeral joint space, no donor site. No dislocation. LEFT shoulder arthroplasty. Intact well-seated non cemented hardware without periprosthetic lucency. Cardiac pacemaker LEFT chest. Soft tissue planes are nonsuspicious.  IMPRESSION: Bony fragments LEFT inferior glenohumeral joint space concerning for acute fracture, possible heterotopic ossification.   Electronically Signed   By: Elon Alas M.D.   On: 08/22/2016 22:21    Sacroiliac Joint Imaging: Sacroiliac Joint DG:  Results for orders placed during the hospital encounter of 04/20/17  DG Si Joints   Narrative CLINICAL DATA:  Hip pain, no known injury, initial encounter  EXAM: BILATERAL SACROILIAC JOINTS - 3+ VIEW  COMPARISON:  None.  FINDINGS: Degenerative changes are noted at the lumbosacral junction. The sacral ala are within normal limits. No significant sclerosis of the sacroiliac joints is noted. Degenerative changes of the hip joints right greater than left is noted.  IMPRESSION: No acute abnormality seen.   Electronically Signed   By: [name] M.D.   On: 04/20/2017  15:43    Hip Imaging: Hip-R DG 2-3 views:  Results for orders placed during the hospital encounter of 04/20/17  DG HIP UNILAT W OR W/O PELVIS 2-3 VIEWS RIGHT   Narrative CLINICAL DATA:  Bilateral hip pain  EXAM: DG HIP (WITH OR WITHOUT PELVIS) 2-3V RIGHT  COMPARISON:  None.  FINDINGS: Pelvic ring is intact. Degenerative changes of the right hip joint are noted somewhat more marked than that seen on the left side. No acute fracture or dislocation is noted. No soft tissue abnormality is seen.  IMPRESSION: Degenerative change of the right hip.   Electronically Signed   By: [name] M.D.   On: 04/20/2017 15:43    Hip-L DG 2-3 views:  Results for orders placed during the hospital encounter of 04/20/17  DG HIP UNILAT W OR W/O PELVIS 2-3 VIEWS LEFT    Narrative CLINICAL DATA:  Hip pain, initial encounter  EXAM: DG HIP (WITH OR WITHOUT PELVIS) 3V LEFT  COMPARISON:  None.  FINDINGS: Degenerative changes of left hip joint are seen. There somewhat less marked than that seen on the right. No soft tissue abnormality is seen.  IMPRESSION: Degenerative change without acute abnormality.   Electronically Signed   By: [name] M.D.   On: 04/20/2017 15:44    Complexity Note: Imaging results reviewed. Results shared with Ms. Silveira, using Layman's terms.                         Meds   Current Outpatient Medications:  .  albuterol (PROVENTIL) (2.5 MG/3ML) 0.083% nebulizer solution, Take 3 mLs (2.5 mg total) by nebulization every 8 (eight) hours as needed. Shortness of breath and wheezing, Disp: 75 mL, Rfl: 12 .  apixaban (ELIQUIS) 5 MG TABS tablet, Take 1 tablet (5 mg total) by mouth 2 (two) times daily., Disp: 60 tablet, Rfl: 0 .  bisacodyl (DULCOLAX) 5 MG EC tablet, Take 2 tablets (10 mg total) by mouth daily as needed for moderate constipation., Disp: 60 tablet, Rfl: 2 .  Cholecalciferol (VITAMIN D) 2000 units tablet, Take 2,000 Units by mouth daily. , Disp: , Rfl:  .  furosemide (LASIX) 20 MG tablet, Take 0.5 tablets (10 mg total) by mouth daily., Disp: 45 tablet, Rfl: 1 .  levocetirizine (XYZAL) 5 MG tablet, Take 1 tablet (5 mg total) by mouth daily., Disp: 30 tablet, Rfl: 5 .  lisinopril (PRINIVIL,ZESTRIL) 10 MG tablet, Take 1 tablet (10 mg total) by mouth daily., Disp: 90 tablet, Rfl: 1 .  Melatonin 10 MG CAPS, Take 20 mg by mouth at bedtime as needed., Disp: 60 capsule, Rfl: 2 .  nystatin (MYCOSTATIN) 100000 UNIT/ML suspension, , Disp: , Rfl: 5 .  Oxycodone HCl 20 MG TABS, Take 1 tablet (20 mg total) by mouth every 8 (eight) hours as needed., Disp: 90 tablet, Rfl: 0 .  [START ON 06/06/2018] Oxycodone HCl 20 MG TABS, Take 1 tablet (20 mg total) by mouth every 8 (eight) hours as needed., Disp: 90 tablet, Rfl: 0 .  [START ON  07/06/2018] Oxycodone HCl 20 MG TABS, Take 1 tablet (20 mg total) by mouth every 8 (eight) hours as needed., Disp: 90 tablet, Rfl: 0 .  OXYGEN, Inhale 2 L into the lungs at bedtime. , Disp: , Rfl:  .  polyethylene glycol (MIRALAX / GLYCOLAX) packet, Take 17 g by mouth daily as needed for moderate constipation., Disp: 14 each, Rfl: 2 .  rOPINIRole (REQUIP) 2 MG tablet, Take 2  tablets at bedtime, Disp: 60 tablet, Rfl: 2  ROS  Constitutional: Denies any fever or chills Gastrointestinal: No reported hemesis, hematochezia, vomiting, or acute GI distress Musculoskeletal: Denies any acute onset joint swelling, redness, loss of ROM, or weakness Neurological: No reported episodes of acute onset apraxia, aphasia, dysarthria, agnosia, amnesia, paralysis, loss of coordination, or loss of consciousness  Allergies  Ms. Aloisi is allergic to flexeril [cyclobenzaprine hcl] and vancomycin.  PFSH  Drug: Ms. Bucio  reports that she does not use drugs. Alcohol:  reports that she does not drink alcohol. Tobacco:  reports that she quit smoking about 19 years ago. Her smoking use included cigarettes. She has a 50.00 pack-year smoking history. She has never used smokeless tobacco. Medical:  has a past medical history of Arrhythmia, sinus node (04/03/2014), Arthritis, Arthritis, Atrial fibrillation ([place]), Back pain, Bradycardia, Breast cancer ([place]) (2004), Cancer ([place]) (2004), CHF (congestive heart failure) ([place]), Chronic back pain, CKD (chronic kidney disease), COPD (chronic obstructive pulmonary disease) ([place]), Cystocele, Decubitus ulcers, Dehydration, Gout, Hematuria, Hepatitis C, Hyperlipidemia, Hypertension, Hypomagnesemia (06/25/2015), OAB (overactive bladder), Overactive bladder, Restless leg, Shoulder pain, bilateral, Urinary frequency, UTI (lower urinary tract infection), and Vaginal atrophy. Surgical: Ms. Osgood  has a past surgical history that includes Abdominal hysterectomy; Back surgery; Rotator cuff repair;  Breast surgery; Cataract extraction w/PHACO (10/19/2011); Cataract extraction w/PHACO (11/02/2011); Carpal tunnel release; Joint replacement; Colon surgery; Pacemaker insertion (N/A, 12/24/2015); Cholecystectomy; Breast lumpectomy (Right, 2004); and Insert / replace / remove pacemaker. Family: family history includes Breast cancer in her paternal aunt; Breast cancer (age of onset: 19) in her sister; Cancer in her sister; Heart disease in her father and mother; Kidney disease in her brother; Stroke in her mother.  Constitutional Exam  General appearance: Well nourished, well developed, and well hydrated. In no apparent acute distress Vitals:   05/29/18 1015  BP: 129/78  Pulse: 63  Resp: 18  Temp: 97.7 F (36.5 C)  TempSrc: Oral  SpO2: 99%  Weight: 154 lb (69.9 kg)  Height: 5' 5.5" (1.664 m)   BMI Assessment: Estimated body mass index is 25.24 kg/m as calculated from the following:   Height as of this encounter: 5' 5.5" (1.664 m).   Weight as of this encounter: 154 lb (69.9 kg).  BMI interpretation table: BMI level Category Range association with higher incidence of chronic pain  <18 kg/m2 Underweight   18.5-24.9 kg/m2 Ideal body weight   25-29.9 kg/m2 Overweight Increased incidence by 20%  30-34.9 kg/m2 Obese (Class I) Increased incidence by 68%  35-39.9 kg/m2 Severe obesity (Class II) Increased incidence by 136%  >40 kg/m2 Extreme obesity (Class III) Increased incidence by 254%   Patient's current BMI Ideal Body weight  Body mass index is 25.24 kg/m. Ideal body weight: 58.2 kg (128 lb 3.2 oz) Adjusted ideal body weight: 62.8 kg (138 lb 8.3 oz)   BMI Readings from Last 4 Encounters:  05/29/18 25.24 kg/m  05/04/18 25.29 kg/m  04/25/18 25.29 kg/m  02/09/18 23.87 kg/m   Wt Readings from Last 4 Encounters:  05/29/18 154 lb (69.9 kg)  05/04/18 152 lb (68.9 kg)  04/25/18 154 lb 4.8 oz (70 kg)  02/09/18 157 lb (71.2 kg)  Psych/Mental status: Alert, oriented x 3 (person, place,  & time)       Eyes: PERLA Respiratory: No evidence of acute respiratory distress  Cervical Spine Area Exam  Skin & Axial Inspection: No masses, redness, edema, swelling, or associated skin lesions Alignment: Symmetrical Functional ROM: Unrestricted ROM  Stability: No instability detected Muscle Tone/Strength: Functionally intact. No obvious neuro-muscular anomalies detected. Sensory (Neurological): Unimpaired Palpation: No palpable anomalies              Upper Extremity (UE) Exam    Side: Right upper extremity  Side: Left upper extremity  Skin & Extremity Inspection: Evidence of prior arthroplastic surgery  Skin & Extremity Inspection: Left middle finger trigger finger.  Functional ROM: Decreased ROM for shoulder  Functional ROM: Decreased ROM for shoulder  Muscle Tone/Strength: Guarding  Muscle Tone/Strength: TEFL teacher (Neurological): Arthropathic arthralgia affecting the shoulder  Sensory (Neurological): Arthropathic arthralgia affecting the shoulder  Palpation: No palpable anomalies              Palpation: No palpable anomalies              Provocative Test(s):  Phalen's test: deferred Tinel's test: deferred Apley's scratch test (touch opposite shoulder):  Action 1 (Across chest): Decreased ROM Action 2 (Overhead): Decreased ROM Action 3 (LB reach): Decreased ROM   Provocative Test(s):  Phalen's test: deferred Tinel's test: deferred Apley's scratch test (touch opposite shoulder):  Action 1 (Across chest): Decreased ROM Action 2 (Overhead): Decreased ROM Action 3 (LB reach): Decreased ROM    Thoracic Spine Area Exam  Skin & Axial Inspection: No masses, redness, or swelling Alignment: Symmetrical Functional ROM: Unrestricted ROM Stability: No instability detected Muscle Tone/Strength: Functionally intact. No obvious neuro-muscular anomalies detected. Sensory (Neurological): Unimpaired Muscle strength & Tone: No palpable anomalies  Lumbar Spine Area Exam   Skin & Axial Inspection: No masses, redness, or swelling Alignment: Symmetrical Functional ROM: Decreased ROM       Stability: No instability detected Muscle Tone/Strength: Increased muscle tone over affected area Sensory (Neurological): Movement-associated pain Palpation: Complains of area being tender to palpation       Provocative Tests: Hyperextension/rotation test: (+) on the right for facet joint pain. Lumbar quadrant test (Kemp's test): (+) on the right for facet joint pain. Lateral bending test: deferred today       Patrick's Maneuver: (+) for bilateral S-I arthralgia and for bilateral hip arthralgia FABER test: (+) for bilateral S-I arthralgia and for bilateral hip arthralgia S-I anterior distraction/compression test: deferred today         S-I lateral compression test: deferred today         S-I Thigh-thrust test: deferred today         S-I Gaenslen's test: deferred today          Gait & Posture Assessment  Ambulation: Unassisted Gait: Relatively normal for age and body habitus Posture: WNL   Lower Extremity Exam    Side: Right lower extremity  Side: Left lower extremity  Stability: No instability observed          Stability: No instability observed          Skin & Extremity Inspection: Skin color, temperature, and hair growth are WNL. No peripheral edema or cyanosis. No masses, redness, swelling, asymmetry, or associated skin lesions. No contractures.  Skin & Extremity Inspection: Skin color, temperature, and hair growth are WNL. No peripheral edema or cyanosis. No masses, redness, swelling, asymmetry, or associated skin lesions. No contractures.  Functional ROM: Unrestricted ROM                  Functional ROM: Unrestricted ROM                  Muscle Tone/Strength: Functionally intact. No obvious neuro-muscular anomalies detected.  Muscle Tone/Strength: Functionally intact. No obvious neuro-muscular anomalies detected.  Sensory (Neurological): Unimpaired  Sensory  (Neurological): Unimpaired  Palpation: No palpable anomalies  Palpation: No palpable anomalies   Assessment  Primary Diagnosis & Pertinent Problem List: The primary encounter diagnosis was Chronic shoulders pain (Bilateral) (L>R). Diagnoses of Chronic shoulder pain after shoulder replacement (Bilateral) (L>R), Osteoarthritis of shoulder (Bilateral), Trigger finger, left middle finger, Chronic low back pain (Primary Source of Pain) (Bilateral) (L>R), Lumbar facet syndrome (Bilateral) (L>R), and Chronic anticoagulation (Eliquis) were also pertinent to this visit.  Status Diagnosis  Unimproved Unimproved Stable 1. Chronic shoulders pain (Bilateral) (L>R)   2. Chronic shoulder pain after shoulder replacement (Bilateral) (L>R)   3. Osteoarthritis of shoulder (Bilateral)   4. Trigger finger, left middle finger   5. Chronic low back pain (Primary Source of Pain) (Bilateral) (L>R)   6. Lumbar facet syndrome (Bilateral) (L>R)   7. Chronic anticoagulation (Eliquis)     Problems updated and reviewed during this visit: Problem  Insomnia  Chronic anticoagulation (Eliquis)   Stop: x3 days prior to procedures.  Restart: 6 hours after procedures. Indication: A-fib   Atrial Fibrillation and Flutter ([facility])  New Onset Atrial Flutter ([facility])  Tear of Rotator Cuff (Resolved)  Sick Sinus Syndrome ([facility]) (Resolved)  [place] (Acute Kidney Injury) ([facility]) (Resolved)  Arrhythmia, Sinus Node (Resolved)   Plan of Care  Pharmacotherapy (Medications Ordered): No orders of the defined types were placed in this encounter.  Medications administered today: [name] had no medications administered during this visit.   Procedure Orders     Radiofrequency Shoulder Joint Lab Orders  No laboratory test(s) ordered today   Imaging Orders  No imaging studies ordered today   Referral Orders  No referral(s) requested today   Interventional management options: Planned, scheduled, and/or pending:   NOTE: Eliquis  anticoagulation (stop x3 days prior to procedure and restart 6 hours after procedures) Schedule her for bilateral suprascapular RFA, ASAP, under fluoro and IV sedation. We'll follow with right lumbar facet RFA #1 vs. Palliative bilateral IA Hip joint injection.   Considering:   Diagnostic bilateral sacroiliac joint block Possible bilateral sacroiliac joint RFA Diagnostic bilateral intra-articular hip joint injection Diagnostic bilateral femoral nerve + obturator nerve block Possible bilateral femoral nerve + obturator nerve RFA Possible bilateral lumbar facet radiofrequency. (Left side done on 12/13/2016 & 02/09/2018) Palliative bilateral lumbar facet block  Diagnostic right intra-articular shoulder joint injection Diagnostic right suprascapular nerve block Possible right suprascapular radiofrequencyablation.    Palliative PRN treatment(s):   Palliative bilateral lumbar facet block Palliative left lumbar facet RFA #3 (last done on 02/09/18) Diagnostic right intra-articular shoulder joint injection  Diagnostic right suprascapular nerve block Possible right suprascapular radiofrequencyablation.   Provider-requested follow-up: Return for RFA (fluoro + sedation): (B) Suprascapular Nerve RFA, (Blood-thinner Protocol).  Future Appointments  Date Time Provider [place]  07/21/2018 11:00 AM Mikey College, NP [facility] None  08/02/2018 11:00 AM Vevelyn Francois, NP [facility] None  05/08/2019 10:45 AM [place] ADVISOR [facility] None   Primary Care Physician: Mikey College, NP Location: [facility] Note by: Gaspar Cola, MD Date: 05/29/2018; Time: 11:12 AM

## 2018-05-29 ENCOUNTER — Other Ambulatory Visit: Payer: Self-pay

## 2018-05-29 ENCOUNTER — Ambulatory Visit: Payer: Medicare Other | Attending: Pain Medicine | Admitting: Pain Medicine

## 2018-05-29 ENCOUNTER — Encounter: Payer: Self-pay | Admitting: Pain Medicine

## 2018-05-29 VITALS — BP 129/78 | HR 63 | Temp 97.7°F | Resp 18 | Ht 65.5 in | Wt 154.0 lb

## 2018-05-29 DIAGNOSIS — E785 Hyperlipidemia, unspecified: Secondary | ICD-10-CM | POA: Insufficient documentation

## 2018-05-29 DIAGNOSIS — K5900 Constipation, unspecified: Secondary | ICD-10-CM | POA: Insufficient documentation

## 2018-05-29 DIAGNOSIS — M25519 Pain in unspecified shoulder: Secondary | ICD-10-CM | POA: Diagnosis not present

## 2018-05-29 DIAGNOSIS — I130 Hypertensive heart and chronic kidney disease with heart failure and stage 1 through stage 4 chronic kidney disease, or unspecified chronic kidney disease: Secondary | ICD-10-CM | POA: Diagnosis not present

## 2018-05-29 DIAGNOSIS — I4891 Unspecified atrial fibrillation: Secondary | ICD-10-CM | POA: Insufficient documentation

## 2018-05-29 DIAGNOSIS — M5412 Radiculopathy, cervical region: Secondary | ICD-10-CM | POA: Diagnosis not present

## 2018-05-29 DIAGNOSIS — M48061 Spinal stenosis, lumbar region without neurogenic claudication: Secondary | ICD-10-CM | POA: Diagnosis not present

## 2018-05-29 DIAGNOSIS — Z803 Family history of malignant neoplasm of breast: Secondary | ICD-10-CM | POA: Insufficient documentation

## 2018-05-29 DIAGNOSIS — Z7901 Long term (current) use of anticoagulants: Secondary | ICD-10-CM

## 2018-05-29 DIAGNOSIS — Z8614 Personal history of Methicillin resistant Staphylococcus aureus infection: Secondary | ICD-10-CM | POA: Insufficient documentation

## 2018-05-29 DIAGNOSIS — M65332 Trigger finger, left middle finger: Secondary | ICD-10-CM | POA: Insufficient documentation

## 2018-05-29 DIAGNOSIS — M25551 Pain in right hip: Secondary | ICD-10-CM | POA: Diagnosis not present

## 2018-05-29 DIAGNOSIS — M19012 Primary osteoarthritis, left shoulder: Secondary | ICD-10-CM | POA: Insufficient documentation

## 2018-05-29 DIAGNOSIS — M19042 Primary osteoarthritis, left hand: Secondary | ICD-10-CM | POA: Diagnosis not present

## 2018-05-29 DIAGNOSIS — M19011 Primary osteoarthritis, right shoulder: Secondary | ICD-10-CM | POA: Diagnosis not present

## 2018-05-29 DIAGNOSIS — I341 Nonrheumatic mitral (valve) prolapse: Secondary | ICD-10-CM | POA: Diagnosis not present

## 2018-05-29 DIAGNOSIS — I5032 Chronic diastolic (congestive) heart failure: Secondary | ICD-10-CM | POA: Insufficient documentation

## 2018-05-29 DIAGNOSIS — Z8249 Family history of ischemic heart disease and other diseases of the circulatory system: Secondary | ICD-10-CM | POA: Insufficient documentation

## 2018-05-29 DIAGNOSIS — Z853 Personal history of malignant neoplasm of breast: Secondary | ICD-10-CM | POA: Insufficient documentation

## 2018-05-29 DIAGNOSIS — Z96619 Presence of unspecified artificial shoulder joint: Secondary | ICD-10-CM

## 2018-05-29 DIAGNOSIS — G2581 Restless legs syndrome: Secondary | ICD-10-CM | POA: Insufficient documentation

## 2018-05-29 DIAGNOSIS — Z823 Family history of stroke: Secondary | ICD-10-CM | POA: Insufficient documentation

## 2018-05-29 DIAGNOSIS — G9619 Other disorders of meninges, not elsewhere classified: Secondary | ICD-10-CM | POA: Diagnosis not present

## 2018-05-29 DIAGNOSIS — Z888 Allergy status to other drugs, medicaments and biological substances status: Secondary | ICD-10-CM | POA: Insufficient documentation

## 2018-05-29 DIAGNOSIS — I4892 Unspecified atrial flutter: Secondary | ICD-10-CM | POA: Insufficient documentation

## 2018-05-29 DIAGNOSIS — Z96611 Presence of right artificial shoulder joint: Secondary | ICD-10-CM | POA: Insufficient documentation

## 2018-05-29 DIAGNOSIS — Z96612 Presence of left artificial shoulder joint: Secondary | ICD-10-CM | POA: Insufficient documentation

## 2018-05-29 DIAGNOSIS — M7918 Myalgia, other site: Secondary | ICD-10-CM | POA: Insufficient documentation

## 2018-05-29 DIAGNOSIS — M25552 Pain in left hip: Secondary | ICD-10-CM | POA: Diagnosis not present

## 2018-05-29 DIAGNOSIS — J449 Chronic obstructive pulmonary disease, unspecified: Secondary | ICD-10-CM | POA: Diagnosis not present

## 2018-05-29 DIAGNOSIS — M160 Bilateral primary osteoarthritis of hip: Secondary | ICD-10-CM | POA: Insufficient documentation

## 2018-05-29 DIAGNOSIS — K219 Gastro-esophageal reflux disease without esophagitis: Secondary | ICD-10-CM | POA: Insufficient documentation

## 2018-05-29 DIAGNOSIS — Z87891 Personal history of nicotine dependence: Secondary | ICD-10-CM | POA: Insufficient documentation

## 2018-05-29 DIAGNOSIS — M533 Sacrococcygeal disorders, not elsewhere classified: Secondary | ICD-10-CM | POA: Diagnosis not present

## 2018-05-29 DIAGNOSIS — G8929 Other chronic pain: Secondary | ICD-10-CM | POA: Diagnosis present

## 2018-05-29 DIAGNOSIS — M5441 Lumbago with sciatica, right side: Secondary | ICD-10-CM

## 2018-05-29 DIAGNOSIS — N3281 Overactive bladder: Secondary | ICD-10-CM | POA: Insufficient documentation

## 2018-05-29 DIAGNOSIS — M25512 Pain in left shoulder: Secondary | ICD-10-CM

## 2018-05-29 DIAGNOSIS — Z881 Allergy status to other antibiotic agents status: Secondary | ICD-10-CM | POA: Insufficient documentation

## 2018-05-29 DIAGNOSIS — N183 Chronic kidney disease, stage 3 (moderate): Secondary | ICD-10-CM | POA: Insufficient documentation

## 2018-05-29 DIAGNOSIS — Z79899 Other long term (current) drug therapy: Secondary | ICD-10-CM | POA: Insufficient documentation

## 2018-05-29 DIAGNOSIS — G4700 Insomnia, unspecified: Secondary | ICD-10-CM | POA: Diagnosis not present

## 2018-05-29 DIAGNOSIS — M5442 Lumbago with sciatica, left side: Secondary | ICD-10-CM

## 2018-05-29 DIAGNOSIS — M4802 Spinal stenosis, cervical region: Secondary | ICD-10-CM | POA: Insufficient documentation

## 2018-05-29 DIAGNOSIS — M47816 Spondylosis without myelopathy or radiculopathy, lumbar region: Secondary | ICD-10-CM

## 2018-05-29 DIAGNOSIS — M25511 Pain in right shoulder: Secondary | ICD-10-CM | POA: Diagnosis not present

## 2018-05-29 DIAGNOSIS — Z9049 Acquired absence of other specified parts of digestive tract: Secondary | ICD-10-CM | POA: Insufficient documentation

## 2018-05-29 DIAGNOSIS — Z9981 Dependence on supplemental oxygen: Secondary | ICD-10-CM | POA: Insufficient documentation

## 2018-05-29 DIAGNOSIS — Z841 Family history of disorders of kidney and ureter: Secondary | ICD-10-CM | POA: Insufficient documentation

## 2018-05-29 DIAGNOSIS — Z90710 Acquired absence of both cervix and uterus: Secondary | ICD-10-CM | POA: Insufficient documentation

## 2018-05-29 DIAGNOSIS — R001 Bradycardia, unspecified: Secondary | ICD-10-CM | POA: Insufficient documentation

## 2018-05-29 DIAGNOSIS — Z79891 Long term (current) use of opiate analgesic: Secondary | ICD-10-CM | POA: Insufficient documentation

## 2018-05-29 NOTE — Patient Instructions (Addendum)
____________________________________________________________________________________________  Pain Scale  Introduction: The pain score used by this practice is the Verbal Numerical Rating Scale (VNRS-11). This is an 11-point scale. It is for adults and children 10 years or older. There are significant differences in how the pain score is reported, used, and applied. Forget everything you learned in the past and learn this scoring system.  General Information: The scale should reflect your current level of pain. Unless you are specifically asked for the level of your worst pain, or your average pain. If you are asked for one of these two, then it should be understood that it is over the past 24 hours.  Basic Activities of Daily Living (ADL): Personal hygiene, dressing, eating, transferring, and using restroom.  Instructions: Most patients tend to report their level of pain as a combination of two factors, their physical pain and their psychosocial pain. This last one is also known as "suffering" and it is reflection of how physical pain affects you socially and psychologically. From now on, report them separately. From this point on, when asked to report your pain level, report only your physical pain. Use the following table for reference.  Pain Clinic Pain Levels (0-5/10)  Pain Level Score  Description  No Pain 0   Mild pain 1 Nagging, annoying, but does not interfere with basic activities of daily living (ADL). Patients are able to eat, bathe, get dressed, toileting (being able to get on and off the toilet and perform personal hygiene functions), transfer (move in and out of bed or a chair without assistance), and maintain continence (able to control bladder and bowel functions). Blood pressure and heart rate are unaffected. A normal heart rate for a healthy adult ranges from 60 to 100 bpm (beats per minute).   Mild to moderate pain 2 Noticeable and distracting. Impossible to hide from other  people. More frequent flare-ups. Still possible to adapt and function close to normal. It can be very annoying and may have occasional stronger flare-ups. With discipline, patients may get used to it and adapt.   Moderate pain 3 Interferes significantly with activities of daily living (ADL). It becomes difficult to feed, bathe, get dressed, get on and off the toilet or to perform personal hygiene functions. Difficult to get in and out of bed or a chair without assistance. Very distracting. With effort, it can be ignored when deeply involved in activities.   Moderately severe pain 4 Impossible to ignore for more than a few minutes. With effort, patients may still be able to manage work or participate in some social activities. Very difficult to concentrate. Signs of autonomic nervous system discharge are evident: dilated pupils (mydriasis); mild sweating (diaphoresis); sleep interference. Heart rate becomes elevated (>115 bpm). Diastolic blood pressure (lower number) rises above 100 mmHg. Patients find relief in laying down and not moving.   Severe pain 5 Intense and extremely unpleasant. Associated with frowning face and frequent crying. Pain overwhelms the senses.  Ability to do any activity or maintain social relationships becomes significantly limited. Conversation becomes difficult. Pacing back and forth is common, as getting into a comfortable position is nearly impossible. Pain wakes you up from deep sleep. Physical signs will be obvious: pupillary dilation; increased sweating; goosebumps; brisk reflexes; cold, clammy hands and feet; nausea, vomiting or dry heaves; loss of appetite; significant sleep disturbance with inability to fall asleep or to remain asleep. When persistent, significant weight loss is observed due to the complete loss of appetite and sleep deprivation.  Blood   pressure and heart rate becomes significantly elevated. Caution: If elevated blood pressure triggers a pounding headache  associated with blurred vision, then the patient should immediately seek attention at an urgent or emergency care unit, as these may be signs of an impending stroke.    Emergency Department Pain Levels (6-10/10)  Emergency Room Pain 6 Severely limiting. Requires emergency care and should not be seen or managed at an outpatient pain management facility. Communication becomes difficult and requires great effort. Assistance to reach the emergency department may be required. Facial flushing and profuse sweating along with potentially dangerous increases in heart rate and blood pressure will be evident.   Distressing pain 7 Self-care is very difficult. Assistance is required to transport, or use restroom. Assistance to reach the emergency department will be required. Tasks requiring coordination, such as bathing and getting dressed become very difficult.   Disabling pain 8 Self-care is no longer possible. At this level, pain is disabling. The individual is unable to do even the most "basic" activities such as walking, eating, bathing, dressing, transferring to a bed, or toileting. Fine motor skills are lost. It is difficult to think clearly.   Incapacitating pain 9 Pain becomes incapacitating. Thought processing is no longer possible. Difficult to remember your own name. Control of movement and coordination are lost.   The worst pain imaginable 10 At this level, most patients pass out from pain. When this level is reached, collapse of the autonomic nervous system occurs, leading to a sudden drop in blood pressure and heart rate. This in turn results in a temporary and dramatic drop in blood flow to the brain, leading to a loss of consciousness. Fainting is one of the body's self defense mechanisms. Passing out puts the brain in a calmed state and causes it to shut down for a while, in order to begin the healing process.    Summary: 1. Refer to this scale when providing us with your pain level. 2. Be  accurate and careful when reporting your pain level. This will help with your care. 3. Over-reporting your pain level will lead to loss of credibility. 4. Even a level of 1/10 means that there is pain and will be treated at our facility. 5. High, inaccurate reporting will be documented as "Symptom Exaggeration", leading to loss of credibility and suspicions of possible secondary gains such as obtaining more narcotics, or wanting to appear disabled, for fraudulent reasons. 6. Only pain levels of 5 or below will be seen at our facility. 7. Pain levels of 6 and above will be sent to the Emergency Department and the appointment cancelled. ____________________________________________________________________________________________   ____________________________________________________________________________________________  Preparing for Procedure with Sedation  Instructions: . Oral Intake: Do not eat or drink anything for at least 8 hours prior to your procedure. . Transportation: Public transportation is not allowed. Bring an adult driver. The driver must be physically present in our waiting room before any procedure can be started. . Physical Assistance: Bring an adult physically capable of assisting you, in the event you need help. This adult should keep you company at home for at least 6 hours after the procedure. . Blood Pressure Medicine: Take your blood pressure medicine with a sip of water the morning of the procedure. . Blood thinners: Notify our staff if you are taking any blood thinners. Depending on which one you take, there will be specific instructions on how and when to stop it. . Diabetics on insulin: Notify the staff so that you can be   scheduled 1st case in the morning. If your diabetes requires high dose insulin, take only  of your normal insulin dose the morning of the procedure and notify the staff that you have done so. . Preventing infections: Shower with an antibacterial soap  the morning of your procedure. . Build-up your immune system: Take 1000 mg of Vitamin C with every meal (3 times a day) the day prior to your procedure. Marland Kitchen Antibiotics: Inform the staff if you have a condition or reason that requires you to take antibiotics before dental procedures. . Pregnancy: If you are pregnant, call and cancel the procedure. . Sickness: If you have a cold, fever, or any active infections, call and cancel the procedure. . Arrival: You must be in the facility at least 30 minutes prior to your scheduled procedure. . Children: Do not bring children with you. . Dress appropriately: Bring dark clothing that you would not mind if they get stained. . Valuables: Do not bring any jewelry or valuables.  Procedure appointments are reserved for interventional treatments only. Marland Kitchen No Prescription Refills. . No medication changes will be discussed during procedure appointments. . No disability issues will be discussed.  Reasons to call and reschedule or cancel your procedure: (Following these recommendations will minimize the risk of a serious complication.) . Surgeries: Avoid having procedures within 2 weeks of any surgery. (Avoid for 2 weeks before or after any surgery). . Flu Shots: Avoid having procedures within 2 weeks of a flu shots or . (Avoid for 2 weeks before or after immunizations). . Barium: Avoid having a procedure within 7-10 days after having had a radiological study involving the use of radiological contrast. (Myelograms, Barium swallow or enema study). . Heart attacks: Avoid any elective procedures or surgeries for the initial 6 months after a "Myocardial Infarction" (Heart Attack). . Blood thinners: It is imperative that you stop these medications before procedures. Let us know if you if you take any blood thinner.  . Infection: Avoid procedures during or within two weeks of an infection (including chest colds or gastrointestinal problems). Symptoms associated with  infections include: Localized redness, fever, chills, night sweats or profuse sweating, burning sensation when voiding, cough, congestion, stuffiness, runny nose, sore throat, diarrhea, nausea, vomiting, cold or Flu symptoms, recent or current infections. It is specially important if the infection is over the area that we intend to treat. Marland Kitchen Heart and lung problems: Symptoms that may suggest an active cardiopulmonary problem include: cough, chest pain, breathing difficulties or shortness of breath, dizziness, ankle swelling, uncontrolled high or unusually low blood pressure, and/or palpitations. If you are experiencing any of these symptoms, cancel your procedure and contact your primary care physician for an evaluation.  Remember:  Regular Business hours are:  Monday to Thursday 8:00 AM to 4:00 PM  Provider's Schedule: Milinda Pointer, MD:  Procedure days: Tuesday and Thursday 7:30 AM to 4:00 PM  Gillis Santa, MD:  Procedure days: Monday and Wednesday 7:30 AM to 4:00 PM ____________________________________________________________________________________________   ____________________________________________________________________________________________  Blood Thinners  Recommended Time Interval Before and After Neuraxial Block or Catheter Removal  Drug (Generic) Brand Name Time Before Time After Comments  Abciximab Reopro 15 days 2 hours   Alteplase Activase 10 days 10 days   Apixaban Eliquis 3 days 6 hours   Aspirin > 325 mg Goody Powders/Excedrin 11 days  (Usually not stopped)  Aspirin ? 81 mg  7 days  (Usually not stopped)  Cholesterol Medication Lipitor 4 days    Cilostazol Pletal  3 days 5 hours   Clopidogrel Plavix 7-10 days 2 hours   Dabigatran Pradaxa 5 days 6 hours   Delteparin Fragmin 24 hours 4 hours   Dipyridamole + ASA Aggrenox 11days 2 hours   Enoxaparin  Lovenox 24 hours 4 hours   Eptifibatide Integrillin 8 hours 2 hours   Fish oil  4 days    Fondaparinux   Arixtra 72 hours 12 hours   Garlic supplements  7 days    Ginkgo biloba  36 hours    Ginseng  24 hours    Heparin (IV)  4 hours 2 hours   Heparin ()  12 hours 2 hours   Hydroxychloroquine Plaquenil 11 days    LMW Heparin  24 hours    LMWH  24 hours    NSAIDs  3 days  (Usually not stopped)  Prasugrel Effient 7-10 days 6 hours   Reteplase Retavase 10 days 10 days   Rivaroxaban Xarelto 3 days 6 hours   Streptokinase Streptase 10 days 10 days   Tenecteplase TNKase 10 days 10 days   Thrombolytics  10 days  10 days Avoid x 10 days after inj.  Ticagrelor Brilinta 5-7 days 6 hours   Ticlodipine Ticlid 10-14 days 2 hours   Tinzaparin Innohep 24 hours 4 hours   Tirofiban Aggrastat 8 hours 2 hours   Vitamin E  4 days    Warfarin Coumadin 5 days 2 hours   ____________________________________________________________________________________________

## 2018-05-29 NOTE — Progress Notes (Signed)
Nursing Pain Medication Assessment:  Safety precautions to be maintained throughout the outpatient stay will include: orient to surroundings, keep bed in low position, maintain call bell within reach at all times, provide assistance with transfer out of bed and ambulation.  Medication Inspection Compliance: Kristin [name] did not comply with our request to bring her pills to be counted. She was reminded that bringing the medication bottles, even when empty, is a requirement.  Medication: None brought in. Pill/Patch Count: None available to be counted. Bottle Appearance: No container available. Did not bring bottle(s) to appointment. Filled Date: N/A Last Medication intake:  Today

## 2018-05-30 NOTE — Progress Notes (Signed)
I DISCUSSED THAT WITH HER YESTERDAY. SHE IS AWARE.

## 2018-07-04 ENCOUNTER — Ambulatory Visit: Payer: Medicare Other | Admitting: Pain Medicine

## 2018-07-10 ENCOUNTER — Other Ambulatory Visit: Payer: Self-pay | Admitting: Family Medicine

## 2018-07-10 DIAGNOSIS — I5032 Chronic diastolic (congestive) heart failure: Secondary | ICD-10-CM

## 2018-07-13 ENCOUNTER — Ambulatory Visit (HOSPITAL_BASED_OUTPATIENT_CLINIC_OR_DEPARTMENT_OTHER): Payer: Medicare Other | Admitting: Pain Medicine

## 2018-07-13 ENCOUNTER — Encounter: Payer: Self-pay | Admitting: Pain Medicine

## 2018-07-13 ENCOUNTER — Other Ambulatory Visit: Payer: Self-pay

## 2018-07-13 ENCOUNTER — Ambulatory Visit
Admission: RE | Admit: 2018-07-13 | Discharge: 2018-07-13 | Disposition: A | Discharge status: Home / Self Care | Payer: Medicare Other | Source: Ambulatory Visit | Attending: Pain Medicine | Admitting: Pain Medicine

## 2018-07-13 VITALS — BP 153/93 | HR 77 | Temp 97.6°F | Resp 16 | Ht 66.0 in | Wt 155.0 lb

## 2018-07-13 DIAGNOSIS — Z881 Allergy status to other antibiotic agents status: Secondary | ICD-10-CM | POA: Diagnosis not present

## 2018-07-13 DIAGNOSIS — Z7901 Long term (current) use of anticoagulants: Secondary | ICD-10-CM | POA: Insufficient documentation

## 2018-07-13 DIAGNOSIS — M25519 Pain in unspecified shoulder: Secondary | ICD-10-CM | POA: Diagnosis not present

## 2018-07-13 DIAGNOSIS — G8918 Other acute postprocedural pain: Secondary | ICD-10-CM | POA: Diagnosis not present

## 2018-07-13 DIAGNOSIS — M19011 Primary osteoarthritis, right shoulder: Secondary | ICD-10-CM | POA: Insufficient documentation

## 2018-07-13 DIAGNOSIS — Z96619 Presence of unspecified artificial shoulder joint: Secondary | ICD-10-CM | POA: Diagnosis not present

## 2018-07-13 DIAGNOSIS — M25511 Pain in right shoulder: Secondary | ICD-10-CM

## 2018-07-13 DIAGNOSIS — Z96612 Presence of left artificial shoulder joint: Secondary | ICD-10-CM | POA: Insufficient documentation

## 2018-07-13 DIAGNOSIS — Z950 Presence of cardiac pacemaker: Secondary | ICD-10-CM | POA: Diagnosis not present

## 2018-07-13 DIAGNOSIS — Z96611 Presence of right artificial shoulder joint: Secondary | ICD-10-CM | POA: Insufficient documentation

## 2018-07-13 DIAGNOSIS — M19012 Primary osteoarthritis, left shoulder: Secondary | ICD-10-CM | POA: Diagnosis not present

## 2018-07-13 DIAGNOSIS — G8929 Other chronic pain: Secondary | ICD-10-CM

## 2018-07-13 DIAGNOSIS — M25512 Pain in left shoulder: Secondary | ICD-10-CM | POA: Insufficient documentation

## 2018-07-13 DIAGNOSIS — I10 Essential (primary) hypertension: Secondary | ICD-10-CM

## 2018-07-13 DIAGNOSIS — M47816 Spondylosis without myelopathy or radiculopathy, lumbar region: Secondary | ICD-10-CM

## 2018-07-13 MED ORDER — MIDAZOLAM HCL 5 MG/5ML IJ SOLN
1.0000 mg | INTRAMUSCULAR | Status: DC | PRN
  Administered 2018-07-13: 0.5 mg via INTRAVENOUS
  Filled 2018-07-13: qty 5

## 2018-07-13 MED ORDER — HYDROCODONE-ACETAMINOPHEN 5-325 MG PO TABS: 1.0000 | ORAL_TABLET | Freq: Three times a day (TID) | ORAL | 0 refills | Status: AC | PRN

## 2018-07-13 MED ORDER — LIDOCAINE HCL 2 % IJ SOLN
20.0000 mL | Freq: Once | INTRAMUSCULAR | Status: AC
  Administered 2018-07-13: 400 mg

## 2018-07-13 MED ORDER — ROPIVACAINE HCL 2 MG/ML IJ SOLN
9.0000 mL | Freq: Once | INTRAMUSCULAR | Status: AC
  Administered 2018-07-13: 9 mL via PERINEURAL
  Filled 2018-07-13: qty 10

## 2018-07-13 MED ORDER — FENTANYL CITRATE (PF) 100 MCG/2ML IJ SOLN
25.0000 ug | INTRAMUSCULAR | Status: DC | PRN
  Administered 2018-07-13: 25 ug via INTRAVENOUS
  Filled 2018-07-13: qty 2

## 2018-07-13 MED ORDER — LACTATED RINGERS IV SOLN: 1000.0000 mL | Freq: Once | INTRAVENOUS | Status: DC

## 2018-07-13 MED ORDER — LISINOPRIL 10 MG PO TABS: 10.0000 mg | ORAL_TABLET | Freq: Every day | ORAL | 1 refills | Status: DC

## 2018-07-13 MED ORDER — METHYLPREDNISOLONE ACETATE 80 MG/ML IJ SUSP
80.0000 mg | Freq: Once | INTRAMUSCULAR | Status: AC
  Administered 2018-07-13: 80 mg
  Filled 2018-07-13: qty 1

## 2018-07-13 NOTE — Progress Notes (Signed)
Safety precautions to be maintained throughout the outpatient stay will include: orient to surroundings, keep bed in low position, maintain call bell within reach at all times, provide assistance with transfer out of bed and ambulation.  

## 2018-07-13 NOTE — Progress Notes (Signed)
Patient's Name: Kristin [name]  MRN: [id number]  Referring Provider: Mikey College, *  DOB: 01/18/1930  PCP: Mikey College, NP  DOS: 07/13/2018  Note by: Gaspar Cola, MD  Service setting: Ambulatory outpatient  Specialty: Interventional Pain Management  Patient type: Established  Location: [facility] (AMB) Pain Management Facility  Visit type: Interventional Procedure   Primary Reason for Visit: Interventional Pain Management Treatment. CC: Shoulder Pain (bilateral)  Procedure:          Anesthesia, Analgesia, Anxiolysis:  Type: Suprascapular nerve Radiofrequency Ablation #1  Primary Purpose: Therapeutic Region: Posterior Shoulder & Scapular Areas Level: Superior to the scapular spine, in the lateral aspect of the supraspinatus fossa (Suprescapular notch). Target Area: Suprascapular nerve as it passes thru the lower portion of the suprascapular notch. Approach: Posterior percutaneous approach. Laterality: Bilateral  Type: Moderate (Conscious) Sedation combined with Local Anesthesia Indication(s): Analgesia and Anxiety Route: Intravenous (IV) IV Access: Secured Sedation: Meaningful verbal contact was maintained at all times during the procedure  Local Anesthetic: Lidocaine 1-2%  Position: Prone   Indications: 1. 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 Osteoarthritis of shoulder (Bilateral)   3. Chronic shoulders pain (Bilateral) (L>R)   4. Chronic anticoagulation (Eliquis)   5. Acute postoperative pain    Kristin [name] has been dealing with the above chronic pain for longer than three months and has either failed to respond, was unable to tolerate, or simply did not get enough benefit from other more conservative therapies including, but not limited to: 1. Over-the-counter medications 2. Anti-inflammatory medications 3. Muscle relaxants 4. Membrane stabilizers 5. Opioids 6. Physical therapy and/or chiropractic manipulation 7. Modalities  (Heat, ice, etc.) 8. Invasive techniques such as nerve blocks. Kristin [name] has attained more than 50% relief of the pain from a series of diagnostic injections conducted in separate occasions.  Pain Score: Pre-procedure: 4 /10 Post-procedure: 0-No pain/10  Pre-op Assessment:  Kristin [name] is a 84 y.o. (year old), female patient, seen today for interventional treatment. She  has a past surgical history that includes Abdominal hysterectomy; Back surgery; Rotator cuff repair; Breast surgery; Cataract extraction w/PHACO (10/19/2011); Cataract extraction w/PHACO (11/02/2011); Carpal tunnel release; Joint replacement; Colon surgery; Pacemaker insertion (N/A, 12/24/2015); Cholecystectomy; Breast lumpectomy (Right, 2004); and Insert / replace / remove pacemaker. Kristin [name] has a current medication list which includes the following prescription(s): albuterol, apixaban, bisacodyl, vitamin d, furosemide, hydrocodone-acetaminophen, hydrocodone-acetaminophen, levocetirizine, lisinopril, melatonin, nystatin, nystatin, oxycodone hcl, oxygen-helium, ropinirole, fluconazole, oxycodone hcl, oxycodone hcl, and polyethylene glycol, and the following Facility-Administered Medications: fentanyl, lactated ringers, and midazolam. Her primarily concern today is the Shoulder Pain (bilateral)  Initial Vital Signs:  Pulse/HCG Rate: 77ECG Heart Rate: 62 Temp: 97.8 F (36.6 C) Resp: 16 BP: (!) 144/88 SpO2: 98 %  BMI: Estimated body mass index is 25.02 kg/m as calculated from the following:   Height as of this encounter: 5\' 6"  (1.676 m).   Weight as of this encounter: 155 lb (70.3 kg).  Risk Assessment: Allergies: Reviewed. She is allergic to flexeril [cyclobenzaprine hcl] and vancomycin.  Allergy Precautions: None required Coagulopathies: Reviewed. None identified.  Blood-thinner therapy: None at this time Active Infection(s): Reviewed. None identified. Kristin [name] is afebrile  Site Confirmation: Kristin [name] was asked to  confirm the procedure and laterality before marking the site Procedure checklist: Completed Consent: Before the procedure and under the influence of no sedative(s), amnesic(s), or anxiolytics, the patient was informed of the treatment options, risks and possible complications. To fulfill  our ethical and legal obligations, as recommended by the American Medical Association's Code of Ethics, I have informed the patient of my clinical impression; the nature and purpose of the treatment or procedure; the risks, benefits, and possible complications of the intervention; the alternatives, including doing nothing; the risk(s) and benefit(s) of the alternative treatment(s) or procedure(s); and the risk(s) and benefit(s) of doing nothing. The patient was provided information about the general risks and possible complications associated with the procedure. These may include, but are not limited to: failure to achieve desired goals, infection, bleeding, organ or nerve damage, allergic reactions, paralysis, and death. In addition, the patient was informed of those risks and complications associated to the procedure, such as failure to decrease pain; infection; bleeding; organ or nerve damage with subsequent damage to sensory, motor, and/or autonomic systems, resulting in permanent pain, numbness, and/or weakness of one or several areas of the body; allergic reactions; (i.e.: anaphylactic reaction); and/or death. Furthermore, the patient was informed of those risks and complications associated with the medications. These include, but are not limited to: allergic reactions (i.e.: anaphylactic or anaphylactoid reaction(s)); adrenal axis suppression; blood sugar elevation that in diabetics may result in ketoacidosis or comma; water retention that in patients with history of congestive heart failure may result in shortness of breath, pulmonary edema, and decompensation with resultant heart failure; weight gain; swelling or  edema; medication-induced neural toxicity; particulate matter embolism and blood vessel occlusion with resultant organ, and/or nervous system infarction; and/or aseptic necrosis of one or more joints. Finally, the patient was informed that Medicine is not an exact science; therefore, there is also the possibility of unforeseen or unpredictable risks and/or possible complications that may result in a catastrophic outcome. The patient indicated having understood very clearly. We have given the patient no guarantees and we have made no promises. Enough time was given to the patient to ask questions, all of which were answered to the patient's satisfaction. Ms. Franzoni has indicated that she wanted to continue with the procedure. Attestation: I, the ordering provider, attest that I have discussed with the patient the benefits, risks, side-effects, alternatives, likelihood of achieving goals, and potential problems during recovery for the procedure that I have provided informed consent. Date  Time: 07/13/2018  1:15 PM  Pre-Procedure Preparation:  Monitoring: As per clinic protocol. Respiration, ETCO2, SpO2, BP, heart rate and rhythm monitor placed and checked for adequate function Safety Precautions: Patient was assessed for positional comfort and pressure points before starting the procedure. Time-out: I initiated and conducted the "Time-out" before starting the procedure, as per protocol. The patient was asked to participate by confirming the accuracy of the "Time Out" information. Verification of the correct person, site, and procedure were performed and confirmed by me, the nursing staff, and the patient. "Time-out" conducted as per Joint Commission's Universal Protocol (UP.01.01.01). Time: 1352  Description of Procedure:          Area Prepped: Entire shoulder and scapular area Prepping solution: Hibiclens (4.0% Chlorhexidine gluconate solution) Safety Precautions: Aspiration looking for blood return  was conducted prior to all injections. At no point did we inject any substances, as a needle was being advanced. No attempts were made at seeking any paresthesias. Safe injection practices and needle disposal techniques used. Medications properly checked for expiration dates. SDV (single dose vial) medications used. Description of the Procedure: Protocol guidelines were followed. The patient was placed in position over the procedure table. The target area was identified and the area prepped in the  usual manner. The skin and muscle were infiltrated with local anesthetic. Appropriate amount of time allowed to pass for local anesthetics to take effect. Radiofrequency needles were introduced to the target area using fluoroscopic guidance. Using the NeuroTherm NT1100 Radiofrequency Generator, sensory stimulation using 50 Hz was used to locate & identify the nerve, making sure that the needle was positioned such that there was no sensory stimulation below 0.3 V or above 0.7 V. Stimulation using 2 Hz was used to evaluate the motor component. Care was taken not to lesion any nerves that demonstrated motor stimulation of the lower extremities at an output of less than 2.5 times that of the sensory threshold, or a maximum of 2.0 V. Once satisfactory placement of the needles was achieved, the numbing solution was slowly injected after negative aspiration. After waiting for at least 2 minutes, the ablation was performed at 80 degrees C for 60 seconds, using regular Radiofrequency settings. Once the procedure was completed, the needles were then removed and the area cleansed, making sure to leave some of the prepping solution back to take advantage of its long term bactericidal properties. Intra-operative Compliance: Compliant   Vitals:   07/13/18 1425 07/13/18 1434 07/13/18 1444 07/13/18 1453  BP: 139/79 (!) 130/97 137/85 (!) 153/93  Pulse:      Resp: 18 16 (!) 22 16  Temp:  98 F (36.7 C)  97.6 F (36.4 C)    TempSrc:  Temporal  Temporal  SpO2: 94% 99% 98% 97%  Weight:      Height:        Start Time: 1352 hrs. End Time: (P) 1425 hrs.  Materials & Medications:  Needle(s) Type: Teflon-coated, curved tip, Radiofrequency needle(s) Gauge: 22G Length: 10cm Medication(s): Please see orders for medications and dosing details.  Imaging Guidance (Non-Spinal):          Type of Imaging Technique: Fluoroscopy Guidance (Non-Spinal) Indication(s): Assistance in needle guidance and placement for procedures requiring needle placement in or near specific anatomical locations not easily accessible without such assistance. Exposure Time: Please see nurses notes. Contrast: Before injecting any contrast, we confirmed that the patient did not have an allergy to iodine, shellfish, or radiological contrast. Once satisfactory needle placement was completed at the desired level, radiological contrast was injected. Contrast injected under live fluoroscopy. No contrast complications. See chart for type and volume of contrast used. Fluoroscopic Guidance: I was personally present during the use of fluoroscopy. "Tunnel Vision Technique" used to obtain the best possible view of the target area. Parallax error corrected before commencing the procedure. "Direction-depth-direction" technique used to introduce the needle under continuous pulsed fluoroscopy. Once target was reached, antero-posterior, oblique, and lateral fluoroscopic projection used confirm needle placement in all planes. Images permanently stored in EMR. Interpretation: I personally interpreted the imaging intraoperatively. Adequate needle placement confirmed in multiple planes. Appropriate spread of contrast into desired area was observed. No evidence of afferent or efferent intravascular uptake. Permanent images saved into the patient's record.  Antibiotic Prophylaxis:   Anti-infectives (From admission, onward)   None     Indication(s): None  identified  Post-operative Assessment:  Post-procedure Vital Signs:  Pulse/HCG Rate: 7762 Temp: 97.6 F (36.4 C) Resp: 16 BP: (!) 153/93 SpO2: 97 %  EBL: None  Complications: No immediate post-treatment complications observed by team, or reported by patient.  Note: The patient tolerated the entire procedure well. A repeat set of vitals were taken after the procedure and the patient was kept under observation following institutional policy, for  this type of procedure. Post-procedural neurological assessment was performed, showing return to baseline, prior to discharge. The patient was provided with post-procedure discharge instructions, including a section on how to identify potential problems. Should any problems arise concerning this procedure, the patient was given instructions to immediately contact us, at any time, without hesitation. In any case, we plan to contact the patient by telephone for a follow-up status report regarding this interventional procedure.  Comments:  No additional relevant information.  Plan of Care    Imaging Orders     DG C-Arm 1-60 Min-No Report  Procedure Orders     Radiofrequency Shoulder Joint     Radiofrequency,Lumbar  Medications ordered for procedure: Meds ordered this encounter  Medications  . lidocaine (XYLOCAINE) 2 % (with pres) injection 400 mg  . midazolam (VERSED) 5 MG/5ML injection 1-2 mg    Make sure Flumazenil is available in the pyxis when using this medication. If oversedation occurs, administer 0.2 mg IV over 15 sec. If after 45 sec no response, administer 0.2 mg again over 1 min; may repeat at 1 min intervals; not to exceed 4 doses (1 mg)  . fentaNYL (SUBLIMAZE) injection 25-50 mcg    Make sure Narcan is available in the pyxis when using this medication. In the event of respiratory depression (RR< 8/min): Titrate NARCAN (naloxone) in increments of 0.1 to 0.2 mg IV at 2-3 minute intervals, until desired degree of reversal.  .  lactated ringers infusion 1,000 mL  . ropivacaine (PF) 2 mg/mL (0.2%) (NAROPIN) injection 9 mL  . methylPREDNISolone acetate (DEPO-MEDROL) injection 80 mg  . HYDROcodone-acetaminophen (NORCO/VICODIN) 5-325 MG tablet    Sig: Take 1 tablet by mouth every 8 (eight) hours as needed for up to 7 days for severe pain. Must last 7 days.    Dispense:  21 tablet    Refill:  0    For acute post-operative pain. Not to be refilled. Must last 7 days.  Marland Kitchen HYDROcodone-acetaminophen (NORCO/VICODIN) 5-325 MG tablet    Sig: Take 1 tablet by mouth every 8 (eight) hours as needed for up to 7 days for severe pain. Must last for 7 days.    Dispense:  21 tablet    Refill:  0    For acute post-operative pain. Not to be refilled. Must last 7 days.   Medications administered: We administered lidocaine, midazolam, fentaNYL, ropivacaine (PF) 2 mg/mL (0.2%), and methylPREDNISolone acetate.  See the medical record for exact dosing, route, and time of administration.  Disposition: Discharge home  Discharge Date & Time: 07/13/2018; 1454 hrs.   Physician-requested Follow-up: Return in about 2 weeks (around 07/27/2018) for RFA (fluoro + sedation): (R) L-FCT RFA #1.  Future Appointments  Date Time Provider [place]  07/21/2018 11:00 AM Mikey College, NP [facility] None  08/02/2018 11:00 AM Vevelyn Francois, NP [facility] None  05/08/2019 10:45 AM Mabel ADVISOR [facility] None   Primary Care Physician: Mikey College, NP Location: [facility] Out[facility] Management Facility Note by: Gaspar Cola, MD Date: 07/13/2018; Time: 3:10 PM  Disclaimer:  Medicine is not an Chief Strategy Officer. The only guarantee in medicine is that nothing is guaranteed. It is important to note that the decision to proceed with this intervention was based on the information collected from the patient. The Data and conclusions were drawn from the patient's questionnaire, the interview, and the physical examination.  Because the information was provided in large part by the patient, it cannot be guaranteed that it  has not been purposely or unconsciously manipulated. Every effort has been made to obtain as much relevant data as possible for this evaluation. It is important to note that the conclusions that lead to this procedure are derived in large part from the available data. Always take into account that the treatment [name] also be dependent on availability of resources and existing treatment guidelines, considered by other Pain Management Practitioners as being common knowledge and practice, at the time of the intervention. For Medico-Legal purposes, it is also important to point out that variation in procedural techniques and pharmacological choices are the acceptable norm. The indications, contraindications, technique, and results of the above procedure should only be interpreted and judged by a Board-Certified Interventional Pain Specialist with extensive familiarity and expertise in the same exact procedure and technique.

## 2018-07-13 NOTE — Patient Instructions (Addendum)
___________________________________________________________________________________________  Post-Radiofrequency (RF) Discharge Instructions  You have just completed a Radiofrequency Neurotomy.  The following instructions will provide you with information and guidelines for self-care upon discharge.  If at any time you have questions or concerns please call your physician. DO NOT DRIVE YOURSELF!!  Instructions:  Apply ice: Fill a plastic sandwich bag with crushed ice. Cover it with a small towel and apply to injection site. Apply for 15 minutes then remove x 15 minutes. Repeat sequence on day of procedure, until you go to bed. The purpose is to minimize swelling and discomfort after procedure.  Apply heat: Apply heat to procedure site starting the day following the procedure. The purpose is to treat any soreness and discomfort from the procedure.  Food intake: No eating limitations, unless stipulated above.  Nevertheless, if you have had sedation, you may experience some nausea.  In this case, it may be wise to wait at least two hours prior to resuming regular diet.  Physical activities: Keep activities to a minimum for the first 8 hours after the procedure. For the first 24 hours after the procedure, do not drive a motor vehicle,  Operate heavy machinery, power tools, or handle any weapons.  Consider walking with the use of an assistive device or accompanied by an adult for the first 24 hours.  Do not drink alcoholic beverages including beer.  Do not make any important decisions or sign any legal documents. Go home and rest today.  Resume activities tomorrow, as tolerated.  Use caution in moving about as you may experience mild leg weakness.  Use caution in cooking, use of household electrical appliances and climbing steps.  Driving: If you have received any sedation, you are not allowed to drive for 24 hours after your procedure.  Blood thinner: Restart your blood thinner 6 hours after your  procedure. (Only for those taking blood thinners)  Insulin: As soon as you can eat, you may resume your normal dosing schedule. (Only for those taking insulin)  Medications: May resume pre-procedure medications.  Do not take any drugs, other than what has been prescribed to you.  Infection prevention: Keep procedure site clean and dry.  Post-procedure Pain Diary: Extremely important that this be done correctly and accurately. Recorded information will be used to determine the next step in treatment.  Pain evaluated is that of treated area only. Do not include pain from an untreated area.  Complete every hour, on the hour, for the initial 8 hours. Set an alarm to help you do this part accurately.  Do not go to sleep and have it completed later. It will not be accurate.  Follow-up appointment: Keep your follow-up appointment after the procedure. Usually 2 weeks for most procedures. (6 weeks in the case of radiofrequency.) Bring you pain diary.   Expect:  From numbing medicine (AKA: Local Anesthetics): Numbness or decrease in pain.  Onset: Full effect within 15 minutes of injected.  Duration: It will depend on the type of local anesthetic used. On the average, 1 to 8 hours.   From steroids: Decrease in swelling or inflammation. Once inflammation is improved, relief of the pain will follow.  Onset of benefits: Depends on the amount of swelling present. The more swelling, the longer it will take for the benefits to be seen. In some cases, up to 10 days.  Duration: Steroids will stay in the system x 2 weeks. Duration of benefits will depend on multiple posibilities including persistent irritating factors.  From procedure: Some   discomfort is to be expected once the numbing medicine wears off. This should be minimal if ice and heat are applied as instructed.  Call if:  You experience numbness and weakness that gets worse with time, as opposed to wearing off.  He experience any unusual  bleeding, difficulty breathing, or loss of the ability to control your bowel and bladder. (This applies to Spinal procedures only)  You experience any redness, swelling, heat, red streaks, elevated temperature, fever, or any other signs of a possible infection.  Emergency Numbers:  [place] hours (Monday - Thursday, 8:00 AM - 4:00 PM) (Friday, 9:00 AM - 12:00 Noon): [phone]  After hours: [phone] ____________________________________________________________________________________________    ____________________________________________________________________________________________  Preparing for Procedure with Sedation  Instructions: . Oral Intake: Do not eat or drink anything for at least 8 hours prior to your procedure. . Transportation: Public transportation is not allowed. Bring an adult driver. The driver must be physically present in our waiting room before any procedure can be started. Marland Kitchen Physical Assistance: Bring an adult physically capable of assisting you, in the event you need help. This adult should keep you company at home for at least 6 hours after the procedure. . Blood Pressure Medicine: Take your blood pressure medicine with a sip of water the morning of the procedure. . Blood thinners: Notify our staff if you are taking any blood thinners. Depending on which one you take, there will be specific instructions on how and when to stop it. . Diabetics on insulin: Notify the staff so that you can be scheduled 1st case in the morning. If your diabetes requires high dose insulin, take only  of your normal insulin dose the morning of the procedure and notify the staff that you have done so. . Preventing infections: Shower with an antibacterial soap the morning of your procedure. . Build-up your immune system: Take 1000 mg of Vitamin C with every meal (3 times a day) the day prior to your procedure. Marland Kitchen Antibiotics: Inform the staff if you have a condition or  reason that requires you to take antibiotics before dental procedures. . Pregnancy: If you are pregnant, call and cancel the procedure. . Sickness: If you have a cold, fever, or any active infections, call and cancel the procedure. . Arrival: You must be in the facility at least 30 minutes prior to your scheduled procedure. . Children: Do not bring children with you. . Dress appropriately: Bring dark clothing that you would not mind if they get stained. . Valuables: Do not bring any jewelry or valuables.  Procedure appointments are reserved for interventional treatments only. Marland Kitchen No Prescription Refills. . No medication changes will be discussed during procedure appointments. . No disability issues will be discussed.  Reasons to call and reschedule or cancel your procedure: (Following these recommendations will minimize the risk of a serious complication.) . Surgeries: Avoid having procedures within 2 weeks of any surgery. (Avoid for 2 weeks before or after any surgery). . Flu Shots: Avoid having procedures within 2 weeks of a flu shots or . (Avoid for 2 weeks before or after immunizations). . Barium: Avoid having a procedure within 7-10 days after having had a radiological study involving the use of radiological contrast. (Myelograms, Barium swallow or enema study). . Heart attacks: Avoid any elective procedures or surgeries for the initial 6 months after a "Myocardial Infarction" (Heart Attack). . Blood thinners: It is imperative that you stop these medications before procedures. Let us know if you if  you take any blood thinner.  . Infection: Avoid procedures during or within two weeks of an infection (including chest colds or gastrointestinal problems). Symptoms associated with infections include: Localized redness, fever, chills, night sweats or profuse sweating, burning sensation when voiding, cough, congestion, stuffiness, runny nose, sore throat, diarrhea, nausea, vomiting, cold or Flu  symptoms, recent or current infections. It is specially important if the infection is over the area that we intend to treat. Marland Kitchen Heart and lung problems: Symptoms that may suggest an active cardiopulmonary problem include: cough, chest pain, breathing difficulties or shortness of breath, dizziness, ankle swelling, uncontrolled high or unusually low blood pressure, and/or palpitations. If you are experiencing any of these symptoms, cancel your procedure and contact your primary care physician for an evaluation.  Remember:  Regular Business hours are:  Monday to Thursday 8:00 AM to 4:00 PM  Provider's Schedule: Milinda Pointer, MD:  Procedure days: Tuesday and Thursday 7:30 AM to 4:00 PM  Gillis Santa, MD:  Procedure days: Monday and Wednesday 7:30 AM to 4:00 PM ____________________________________________________________________________________________   Hydrocodone - apap 5-325 mg x 2 weeks to fill on 07/13/18 and 07/20/18 escribed to your pharmacy

## 2018-07-14 ENCOUNTER — Telehealth: Payer: Self-pay

## 2018-07-14 NOTE — Telephone Encounter (Signed)
Post procedure phone call.  Patient states she is doing real good this morning.

## 2018-07-18 ENCOUNTER — Ambulatory Visit: Payer: Medicare Other | Admitting: Pain Medicine

## 2018-07-21 ENCOUNTER — Ambulatory Visit: Payer: Medicare Other | Admitting: Nurse Practitioner

## 2018-07-25 ENCOUNTER — Ambulatory Visit (INDEPENDENT_AMBULATORY_CARE_PROVIDER_SITE_OTHER): Payer: Medicare Other | Admitting: Nurse Practitioner

## 2018-07-25 ENCOUNTER — Other Ambulatory Visit: Payer: Self-pay

## 2018-07-25 ENCOUNTER — Encounter: Payer: Self-pay | Admitting: Nurse Practitioner

## 2018-07-25 VITALS — BP 129/71 | HR 67 | Temp 97.8°F | Resp 17 | Ht 66.0 in | Wt 152.6 lb

## 2018-07-25 DIAGNOSIS — I10 Essential (primary) hypertension: Secondary | ICD-10-CM

## 2018-07-25 DIAGNOSIS — G894 Chronic pain syndrome: Secondary | ICD-10-CM

## 2018-07-25 DIAGNOSIS — T402X5A Adverse effect of other opioids, initial encounter: Secondary | ICD-10-CM

## 2018-07-25 DIAGNOSIS — I5032 Chronic diastolic (congestive) heart failure: Secondary | ICD-10-CM

## 2018-07-25 DIAGNOSIS — K5903 Drug induced constipation: Secondary | ICD-10-CM | POA: Diagnosis not present

## 2018-07-25 LAB — COMPLETE METABOLIC PANEL WITH GFR
AG Ratio: 1.6 (calc) (ref 1.0–2.5)
ALT: 11 U/L (ref 6–29)
AST: 21 U/L (ref 10–35)
Albumin: 4.4 g/dL (ref 3.6–5.1)
Alkaline phosphatase (APISO): 81 U/L (ref 33–130)
BUN/Creatinine Ratio: 19 (calc) (ref 6–22)
BUN: 29 mg/dL — ABNORMAL HIGH (ref 7–25)
CO2: 23 mmol/L (ref 20–32)
Calcium: 10.2 mg/dL (ref 8.6–10.4)
Chloride: 103 mmol/L (ref 98–110)
Creat: 1.49 mg/dL — ABNORMAL HIGH (ref 0.60–0.88)
GFR, Est African American: 36 mL/min/{1.73_m2} — ABNORMAL LOW (ref >=60)
GFR, Est Non African American: 31 mL/min/{1.73_m2} — ABNORMAL LOW (ref >=60)
Globulin: 2.7 g/dL (calc) (ref 1.9–3.7)
Glucose, Bld: 75 mg/dL (ref 65–99)
Potassium: 4.1 mmol/L (ref 3.5–5.3)
Sodium: 138 mmol/L (ref 135–146)
Total Bilirubin: 0.6 mg/dL (ref 0.2–1.2)
Total Protein: 7.1 g/dL (ref 6.1–8.1)

## 2018-07-25 MED ORDER — FUROSEMIDE 20 MG PO TABS: ORAL_TABLET | ORAL | 0 refills | Status: DC

## 2018-07-25 MED ORDER — LISINOPRIL 10 MG PO TABS: 10.0000 mg | ORAL_TABLET | Freq: Every day | ORAL | 1 refills | Status: DC

## 2018-07-25 MED ORDER — BISACODYL 5 MG PO TBEC: 10.0000 mg | DELAYED_RELEASE_TABLET | Freq: Every day | ORAL | 2 refills | Status: DC | PRN

## 2018-07-25 MED ORDER — DULOXETINE HCL 20 MG PO CPEP: 20.0000 mg | ORAL_CAPSULE | Freq: Every day | ORAL | 3 refills | Status: DC

## 2018-07-25 NOTE — Progress Notes (Signed)
Subjective:    [name], female    DOB: 03-02-1930, 84 y.o.   MRN: [id number]  Kristin [name] is a 84 y.o. female presenting on 07/25/2018 for Hypertension and Back Pain (pt states she got aches and pain, but she currently got an appointment scheduled for next week at the pain clinic )   HPI Hypertension - She is not checking BP at home or outside of clinic.    - Current medications: lisinopril 20 mg once daily and lasix 10 mg daily, tolerating well without side effects - She is not currently symptomatic. - Pt denies headache, lightheadedness, dizziness, changes in vision, chest tightness/pressure, palpitations, leg swelling, sudden loss of speech or loss of consciousness. - She  reports an exercise routine that includes walking, 3-4 days per week. - Her diet is moderate in salt, moderate in fat, and moderate in carbohydrates.  Chronic Pain Had injections in shoulders 2 weeks ago, returns in about 1 week for injections in her back.  Endorses feeling down some, would like increase in her pain medications.   Pain is located most in left side of back, low back, and shoulders bilaterally R>L.  Social History   Tobacco Use  . Smoking status: Former Smoker    Packs/day: 1.25    Years: 40.00    Pack years: 50.00    Types: Cigarettes    Last attempt to quit: 12/16/1998    Years since quitting: 19.6  . Smokeless tobacco: Never Used  Substance Use Topics  . Alcohol use: No    Alcohol/week: 0.0 standard drinks  . Drug use: No    Review of Systems Per HPI unless specifically indicated above     Objective:    BP 129/71 (BP Location: Right Arm, Patient Position: Sitting, Cuff Size: Normal)   Pulse 67   Temp 97.8 F (36.6 C) (Oral)   Ht 5\' 6"  (1.676 m)   Wt 152 lb 9.6 oz (69.2 kg)   BMI 24.63 kg/m   Wt Readings from Last 3 Encounters:  07/25/18 152 lb 9.6 oz (69.2 kg)  07/13/18 155 lb (70.3 kg)  05/29/18 154 lb (69.9 kg)    Physical Exam  Constitutional: She  is oriented to person, place, and time. She appears well-developed and well-nourished. No distress.  HENT:  Head: Normocephalic and atraumatic.  Cardiovascular: Normal rate, regular rhythm, S1 normal, S2 normal and intact distal pulses.  Murmur (harsh, holosystolic 1/6 murmur) heard. Pulmonary/Chest: Effort normal and breath sounds normal. No respiratory distress.  Abdominal: Soft. Bowel sounds are normal. She exhibits no distension. There is no tenderness.  Musculoskeletal: She exhibits edema (trace pedal).  Generalized tenderness left shoulder, lumbar spine.  Mild left sided paraspinal muscle hypertonicity without tenderness.  Neurological: She is alert and oriented to person, place, and time.  Skin: Skin is warm and dry. Capillary refill takes less than 2 seconds.  Psychiatric: She has a normal mood and affect. Her behavior is normal. Judgment and thought content normal.  Vitals reviewed.  Results for orders placed or performed in visit on 02/01/18  ToxASSURE Select 11 (MW), Urine  Result Value Ref Range   Summary FINAL       Assessment & Plan:   Problem List Items Addressed This Visit      Cardiovascular and Mediastinum   Benign essential HYPERTENSION Controlled hypertension.  BP goal < 130/80.  Pt is working on lifestyle modifications.  Taking medications tolerating well without side effects. Complications - AKI in  past  Plan: 1. Continue taking meds today without changes 2. Obtain labs today  3. Encouraged heart healthy diet and increasing exercise to 30 minutes most days of the week. 4. Check BP 1-2 x per week at home, keep log, and bring to clinic at next appointment. 5. Follow up 3 months.     Relevant Medications   lisinopril (PRINIVIL,ZESTRIL) 10 MG tablet   furosemide (LASIX) 20 MG tablet   Other Relevant Orders   COMPLETE METABOLIC PANEL WITH GFR   Chronic diastolic CHF (congestive heart failure) (HCC) Stable today on exam.  Medications tolerated without side  effects.  Continue at current doses.  Refills provided.  Check labs today. Followup 3 months.    Relevant Medications   lisinopril (PRINIVIL,ZESTRIL) 10 MG tablet   furosemide (LASIX) 20 MG tablet     Digestive   Opioid-induced constipation (OIC) (Chronic) Stable today on exam.  Medications tolerated without side effects.  Continue at current doses.  Refills provided.   . Followup 3 months.    Relevant Medications   bisacodyl (DULCOLAX) 5 MG EC tablet     Other   Chronic pain syndrome - Primary (Chronic) Patient without good relief on opioid therapy and current treatments.  Patient also admits she feels down some due to her pain.  Is willing to try alternative, non-opioid therapy. - START duloxetine 20 mg once daily - cautioned about side effects of dizziness, palpitations - may need higher dose for pain control. - follow-up 3 months or sooner if needed.  Patient verbalizes understanding to call and does call when needed.   Relevant Medications   DULoxetine (CYMBALTA) 20 MG capsule      Meds ordered this encounter  Medications  . bisacodyl (DULCOLAX) 5 MG EC tablet    Sig: Take 2 tablets (10 mg total) by mouth daily as needed for moderate constipation.    Dispense:  60 tablet    Refill:  2    Do not place this medication, or any other prescription from our practice, on "Automatic Refill". Patient may have prescription filled one day early if pharmacy is closed on scheduled refill date.    Order Specific Question:   Supervising Provider    Answer:   Olin Hauser [[id number]]  . DULoxetine (CYMBALTA) 20 MG capsule    Sig: Take 1 capsule (20 mg total) by mouth daily.    Dispense:  30 capsule    Refill:  3    Order Specific Question:   Supervising Provider    Answer:   Olin Hauser [[id number]]  . lisinopril (PRINIVIL,ZESTRIL) 10 MG tablet    Sig: Take 1 tablet (10 mg total) by mouth daily.    Dispense:  90 tablet    Refill:  1    Order Specific Question:   Supervising  Provider    Answer:   Olin Hauser [[id number]]  . furosemide (LASIX) 20 MG tablet    Sig: TAKE (1/2) TABLET BY MOUTH ONCE DAILY.    Dispense:  45 tablet    Refill:  0    Order Specific Question:   Supervising Provider    Answer:   Olin Hauser [[id number]]    Follow up plan: Return in about 3 months (around 10/24/2018) for hypertension, depression, duloxetine for pain.  [name], DNP, AGPCNP-BC Adult Gerontology Primary Care Nurse Practitioner [facility] 07/25/2018, 8:17 AM

## 2018-07-25 NOTE — Patient Instructions (Signed)
Kristin [name],   Thank you for coming in to clinic today.  1. For chronic pain: - Continue with pain management.  Consider future reduction of oxycodone for about 1 month and then increase for your current dose to be more effective.  Talk about this with pain management. - START duloxetine 20 mg once daily.  This may help with pain and depression, or generally feeling a little down. You may need a higher dose for pain relief in future.  2. NO changes to blood pressure meds.   - Keep drinking your water to stay hydrated, but not too much. - Keep checking daily weight.   3. Continue your miralax as needed over the counter.  Continue dulcolax daily as prescribed.  Refills are sent.  Please schedule a follow-up appointment with [name], AGNP. Return in about 3 months (around 10/24/2018) for hypertension, depression, duloxetine for pain.  If you have any other questions or concerns, please feel free to call the clinic or send a message through [place]. You may also schedule an earlier appointment if necessary.  You will receive a survey after today's visit either digitally by e-mail or paper by [employer]. Your experiences and feedback matter to [name].  Please respond so we know how we are doing as we provide care for you.   [name], DNP, AGNP-BC Adult Gerontology Nurse Practitioner [place]

## 2018-07-26 ENCOUNTER — Encounter: Payer: Self-pay | Admitting: Nurse Practitioner

## 2018-07-27 ENCOUNTER — Telehealth: Payer: Self-pay | Admitting: Family Medicine

## 2018-07-27 DIAGNOSIS — N183 Chronic kidney disease, stage 3 unspecified: Secondary | ICD-10-CM

## 2018-07-27 NOTE — Telephone Encounter (Signed)
Covering inbox for Kristin [name], AGPCNP-BC while she is out of office.  I called patient today 12/5 approx 2:00pm to discuss her question with lasix and referral to Nephrology. She was seen by PCP on 07/25/18.  Advised her that we will proceed with referral to Nephrology - kidney specialist listed below, verbally gave her address and contact info. She will await apt.  [address] [address] [place], [place] [address] Phone: [phone]   Next - I advised her regarding Creatinine and lasix, I advised that she should STOP Lasix 10mg  (half of 20mg  tab) - instead of taking daily. She may switch to Lasix 10mg  daily PRN ONLY if swelling.  May need to hold ACEi in future depending if it is a long time until she is scheduled for Nephrology.  Forwarded this to her PCP for review upon her return.  Kristin [name], [facility] 07/27/2018, 2:17 PM  -------------------  Copied below is Lab Result comment discussion from her chart.  Notes recorded by Jeanelle Malling, CMA on 07/27/2018 at 2:03 PM EST Patient notified for test result wants to be referred for nephrology and has question about Lasix dosage how much does she needs to reduce. ------  Notes recorded by Wilson Singer, CMA on 07/26/2018 at 2:23 PM EST Attempted to contact the pt, no answer. LMOM to return my call. ------  Notes recorded by Mikey College, NP on 07/26/2018 at 7:59 AM EST CMP shows worsening kidney function again. Continue with good hydration. Recommend nephrology consult. May need to consider reducing lasix. Please let me know if patient is willing to see kidney specialist.  Goal is to preserve remaining kidney function while managing CHF well. No med changes now.

## 2018-08-01 ENCOUNTER — Ambulatory Visit
Admission: RE | Admit: 2018-08-01 | Discharge: 2018-08-01 | Disposition: A | Discharge status: Home / Self Care | Payer: Medicare Other | Source: Ambulatory Visit | Attending: Pain Medicine | Admitting: Pain Medicine

## 2018-08-01 ENCOUNTER — Encounter: Payer: Self-pay | Admitting: Pain Medicine

## 2018-08-01 ENCOUNTER — Other Ambulatory Visit: Payer: Self-pay

## 2018-08-01 ENCOUNTER — Ambulatory Visit (HOSPITAL_BASED_OUTPATIENT_CLINIC_OR_DEPARTMENT_OTHER): Payer: Medicare Other | Admitting: Pain Medicine

## 2018-08-01 VITALS — BP 138/81 | HR 69 | Temp 97.2°F | Resp 13 | Ht 66.0 in | Wt 147.0 lb

## 2018-08-01 DIAGNOSIS — M47817 Spondylosis without myelopathy or radiculopathy, lumbosacral region: Secondary | ICD-10-CM

## 2018-08-01 DIAGNOSIS — M5441 Lumbago with sciatica, right side: Secondary | ICD-10-CM

## 2018-08-01 DIAGNOSIS — M5137 Other intervertebral disc degeneration, lumbosacral region: Secondary | ICD-10-CM | POA: Insufficient documentation

## 2018-08-01 DIAGNOSIS — M4316 Spondylolisthesis, lumbar region: Secondary | ICD-10-CM | POA: Diagnosis not present

## 2018-08-01 DIAGNOSIS — M47816 Spondylosis without myelopathy or radiculopathy, lumbar region: Secondary | ICD-10-CM

## 2018-08-01 DIAGNOSIS — G8929 Other chronic pain: Secondary | ICD-10-CM | POA: Diagnosis not present

## 2018-08-01 DIAGNOSIS — M4310 Spondylolisthesis, site unspecified: Secondary | ICD-10-CM

## 2018-08-01 DIAGNOSIS — M5442 Lumbago with sciatica, left side: Secondary | ICD-10-CM

## 2018-08-01 DIAGNOSIS — G8918 Other acute postprocedural pain: Secondary | ICD-10-CM

## 2018-08-01 DIAGNOSIS — M545 Low back pain: Secondary | ICD-10-CM | POA: Diagnosis present

## 2018-08-01 DIAGNOSIS — G894 Chronic pain syndrome: Secondary | ICD-10-CM

## 2018-08-01 DIAGNOSIS — M961 Postlaminectomy syndrome, not elsewhere classified: Secondary | ICD-10-CM

## 2018-08-01 MED ORDER — ROPIVACAINE HCL 2 MG/ML IJ SOLN
9.0000 mL | Freq: Once | INTRAMUSCULAR | Status: AC
  Administered 2018-08-01: 10 mL via PERINEURAL
  Filled 2018-08-01: qty 10

## 2018-08-01 MED ORDER — MIDAZOLAM HCL 5 MG/5ML IJ SOLN
1.0000 mg | INTRAMUSCULAR | Status: DC | PRN
  Administered 2018-08-01: 1.5 mg via INTRAVENOUS
  Filled 2018-08-01: qty 5

## 2018-08-01 MED ORDER — OXYCODONE HCL 20 MG PO TABS: 20.0000 mg | ORAL_TABLET | Freq: Three times a day (TID) | ORAL | 0 refills | Status: DC | PRN

## 2018-08-01 MED ORDER — LIDOCAINE HCL 2 % IJ SOLN
20.0000 mL | Freq: Once | INTRAMUSCULAR | Status: AC
  Administered 2018-08-01: 400 mg
  Filled 2018-08-01: qty 20

## 2018-08-01 MED ORDER — HYDROCODONE-ACETAMINOPHEN 5-325 MG PO TABS: 1.0000 | ORAL_TABLET | Freq: Three times a day (TID) | ORAL | 0 refills | Status: AC | PRN

## 2018-08-01 MED ORDER — TRIAMCINOLONE ACETONIDE 40 MG/ML IJ SUSP
40.0000 mg | Freq: Once | INTRAMUSCULAR | Status: AC
  Administered 2018-08-01: 40 mg
  Filled 2018-08-01: qty 1

## 2018-08-01 MED ORDER — FENTANYL CITRATE (PF) 100 MCG/2ML IJ SOLN
25.0000 ug | INTRAMUSCULAR | Status: DC | PRN
  Administered 2018-08-01: 75 ug via INTRAVENOUS
  Filled 2018-08-01: qty 2

## 2018-08-01 MED ORDER — LACTATED RINGERS IV SOLN
1000.0000 mL | Freq: Once | INTRAVENOUS | Status: AC
  Administered 2018-08-01: 1000 mL via INTRAVENOUS

## 2018-08-01 NOTE — Progress Notes (Signed)
Nursing Pain Medication Assessment:  Safety precautions to be maintained throughout the outpatient stay will include: orient to surroundings, keep bed in low position, maintain call bell within reach at all times, provide assistance with transfer out of bed and ambulation.  Medication Inspection Compliance: Pill count conducted under aseptic conditions, in front of the patient. Neither the pills nor the bottle was removed from the patient's sight at any time. Once count was completed pills were immediately returned to the patient in their original bottle.  Medication: Oxycodone IR Pill/Patch Count: 9 of 90 pills remain Pill/Patch Appearance: Markings consistent with prescribed medication Bottle Appearance: Standard pharmacy container. Clearly labeled. Filled Date: 44 / 14 / 2019 Last Medication intake:  Today

## 2018-08-01 NOTE — Progress Notes (Signed)
Upon discharge swelling approx 4 cm wide noted at the top insertion site.  No active bleeding noted.  Notified Dr Dossie Arbour, instructed to apply pressure dressing.  4x4''s folded with elasiplast applied to affected area.  Patient denies pain at that site.  Instructed that she could start her blood thinner back tomorrow. Patient verbalizes u/o information.

## 2018-08-01 NOTE — Patient Instructions (Addendum)
___________________________________________________________________________________________  Post-Radiofrequency (RF) Discharge Instructions  You have just completed a Radiofrequency Neurotomy.  The following instructions will provide you with information and guidelines for self-care upon discharge.  If at any time you have questions or concerns please call your physician. DO NOT DRIVE YOURSELF!!  Instructions:  Apply ice: Fill a plastic sandwich bag with crushed ice. Cover it with a small towel and apply to injection site. Apply for 15 minutes then remove x 15 minutes. Repeat sequence on day of procedure, until you go to bed. The purpose is to minimize swelling and discomfort after procedure.  Apply heat: Apply heat to procedure site starting the day following the procedure. The purpose is to treat any soreness and discomfort from the procedure.  Food intake: No eating limitations, unless stipulated above.  Nevertheless, if you have had sedation, you may experience some nausea.  In this case, it may be wise to wait at least two hours prior to resuming regular diet.  Physical activities: Keep activities to a minimum for the first 8 hours after the procedure. For the first 24 hours after the procedure, do not drive a motor vehicle,  Operate heavy machinery, power tools, or handle any weapons.  Consider walking with the use of an assistive device or accompanied by an adult for the first 24 hours.  Do not drink alcoholic beverages including beer.  Do not make any important decisions or sign any legal documents. Go home and rest today.  Resume activities tomorrow, as tolerated.  Use caution in moving about as you may experience mild leg weakness.  Use caution in cooking, use of household electrical appliances and climbing steps.  Driving: If you have received any sedation, you are not allowed to drive for 24 hours after your procedure.  Blood thinner: Restart your blood thinner 6 hours after your  procedure. (Only for those taking blood thinners)  Insulin: As soon as you can eat, you may resume your normal dosing schedule. (Only for those taking insulin)  Medications: May resume pre-procedure medications.  Do not take any drugs, other than what has been prescribed to you.  Infection prevention: Keep procedure site clean and dry.  Post-procedure Pain Diary: Extremely important that this be done correctly and accurately. Recorded information will be used to determine the next step in treatment.  Pain evaluated is that of treated area only. Do not include pain from an untreated area.  Complete every hour, on the hour, for the initial 8 hours. Set an alarm to help you do this part accurately.  Do not go to sleep and have it completed later. It will not be accurate.  Follow-up appointment: Keep your follow-up appointment after the procedure. Usually 2 weeks for most procedures. (6 weeks in the case of radiofrequency.) Bring you pain diary.   Expect:  From numbing medicine (AKA: Local Anesthetics): Numbness or decrease in pain.  Onset: Full effect within 15 minutes of injected.  Duration: It will depend on the type of local anesthetic used. On the average, 1 to 8 hours.   From steroids: Decrease in swelling or inflammation. Once inflammation is improved, relief of the pain will follow.  Onset of benefits: Depends on the amount of swelling present. The more swelling, the longer it will take for the benefits to be seen. In some cases, up to 10 days.  Duration: Steroids will stay in the system x 2 weeks. Duration of benefits will depend on multiple posibilities including persistent irritating factors.  From procedure: Some   discomfort is to be expected once the numbing medicine wears off. This should be minimal if ice and heat are applied as instructed.  Call if:  You experience numbness and weakness that gets worse with time, as opposed to wearing off.  He experience any unusual  bleeding, difficulty breathing, or loss of the ability to control your bowel and bladder. (This applies to Spinal procedures only)  You experience any redness, swelling, heat, red streaks, elevated temperature, fever, or any other signs of a possible infection.  Emergency Numbers:  [place] business hours (Monday - Thursday, 8:00 AM - 4:00 PM) (Friday, 9:00 AM - 12:00 Noon): [phone]  After hours: [phone] ____________________________________________________________________________________________   1.  Ice to back for next 24 hours as tolerated 2.  Hydrocodone - apap 5-325 mg x 2 weeks for post procedure pain      Fill dates 08/01/18 and 08/08/18 escribed to your pharmacy   3.  Oxycodone hcl 20 mg x 3 months escribed to pharmacy      Fill dates 08/05/18, 09/04/18, 10/04/18   [place].  [place], [place]

## 2018-08-01 NOTE — Progress Notes (Signed)
Patient's Name: Kristin [name]  MRN: [id number]  Referring Provider: Milinda Pointer, MD  DOB: [birth year]  PCP: Mikey College, NP  DOS: 08/01/2018  Note by: Gaspar Cola, MD  Service setting: Ambulatory outpatient  Specialty: Interventional Pain Management  Patient type: Established  Location: [facility] (AMB) Pain Management Facility  Visit type: Interventional Procedure   Primary Reason for Visit: Interventional Pain Management Treatment. CC: Back Pain  Procedure:          Anesthesia, Analgesia, Anxiolysis:  Type: Thermal Lumbar Facet, Medial Branch Radiofrequency Ablation/Neurotomy  #3 (Last done on 02/18/18)  Primary Purpose: Therapeutic Region: Posterolateral Lumbosacral Spine Level: L2, L3, L4, L5, & S1 Medial Branch Level(s). These levels will denervate the L3-4, L4-5, and the L5-S1 lumbar facet joints. Laterality: Left  Type: Moderate (Conscious) Sedation combined with Local Anesthesia Indication(s): Analgesia and Anxiety Route: Intravenous (IV) IV Access: Secured Sedation: Meaningful verbal contact was maintained at all times during the procedure  Local Anesthetic: Lidocaine 1-2%  Position: Prone   Indications: 1. Spondylosis without myelopathy or radiculopathy, lumbosacral region   2. Lumbar facet syndrome (Bilateral) (L>R)   3. Lumbar facet hypertrophy (L1-2, L2-3, and L4-5)   4. DDD (degenerative disc disease), lumbosacral   5. 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 Grade 1 Retrolisthesis of L1 over L2   7. Chronic low back pain (Primary Source of Pain) (Bilateral) (L>R)    Ms. Atiyeh has been dealing with the above chronic pain for longer than three months and has either failed to respond, was unable to tolerate, or simply did not get enough benefit from other more conservative therapies including, but not limited to: 1. Over-the-counter medications 2. Anti-inflammatory medications 3. Muscle relaxants 4. Membrane  stabilizers 5. Opioids 6. Physical therapy and/or chiropractic manipulation 7. Modalities (Heat, ice, etc.) 8. Invasive techniques such as nerve blocks. Ms. Steidle has attained more than 50% relief of the pain from a series of diagnostic injections conducted in separate occasions.  Pain Score: Pre-procedure: 4 /10 Post-procedure: 0-No pain/10  Post-Procedure Assessment  07/13/2018 Procedure: Therapeutic bilateral suprascapular RFA #1 under fluoroscopic guidance and IV sedation  Pre-procedure pain score:  4/10 Post-procedure pain score: 0/10 (100% relief) Influential Factors: BMI: 23.73 kg/m Intra-procedural challenges: None observed.         Assessment challenges: None detected.              Reported side-effects: None.        Post-procedural adverse reactions or complications: None reported         Sedation: Sedation provided. When no sedatives are used, the analgesic levels obtained are directly associated to the effectiveness of the local anesthetics. However, when sedation is provided, the level of analgesia obtained during the initial 1 hour following the intervention, is believed to be the result of a combination of factors. These factors may include, but are not limited to: 1. The effectiveness of the local anesthetics used. 2. The effects of the analgesic(s) and/or anxiolytic(s) used. 3. The degree of discomfort experienced by the patient at the time of the procedure. 4. The patients ability and reliability in recalling and recording the events. 5. The presence and influence of possible secondary gains and/or psychosocial factors. Reported result: Relief experienced during the 1st hour after the procedure: 100 % (Ultra-Short Term Relief)            Interpretative annotation: Clinically appropriate result. Analgesia during this period is likely to be Local Anesthetic and/or IV  Sedative (Analgesic/Anxiolytic) related.          Effects of local anesthetic: The analgesic effects  attained during this period are directly associated to the localized infiltration of local anesthetics and therefore cary significant diagnostic value as to the etiological location, or anatomical origin, of the pain. Expected duration of relief is directly dependent on the pharmacodynamics of the local anesthetic used. Long-acting (4-6 hours) anesthetics used.  Reported result: Relief during the next 4 to 6 hour after the procedure: 100 % (Short-Term Relief)            Interpretative annotation: Clinically appropriate result. Analgesia during this period is likely to be Local Anesthetic-related.          Long-term benefit: Defined as the period of time past the expected duration of local anesthetics (1 hour for short-acting and 4-6 hours for long-acting). With the possible exception of prolonged sympathetic blockade from the local anesthetics, benefits during this period are typically attributed to, or associated with, other factors such as analgesic sensory neuropraxia, antiinflammatory effects, or beneficial biochemical changes provided by agents other than the local anesthetics.  Reported result: Extended relief following procedure: 100 %(last for 7 days) (Long-Term Relief)            Interpretative annotation: Clinically possible results. Good relief. No permanent benefit expected. Inflammation plays a part in the etiology to the pain.          Current benefits: Defined as reported results that persistent at this point in time.   Analgesia: 50-75 % Ms. Kostick reports improvement of arthralgia. Function: Somewhat improved ROM: Somewhat improved Interpretative annotation: Ongoing benefit. Therapeutic benefit observed. Effective therapeutic approach. Benefit could signal adequate RF ablation.  Interpretation: Results would suggest a successful palliative intervention.                  Plan:  Today we will proceed with a repeat left-sided lumbar facet RFA #3 under fluoroscopic guidance and IV  sedation.  The last time this side was done was in June 2019.                Controlled Substance Pharmacotherapy Assessment REMS (Risk Evaluation and Mitigation Strategy)  Analgesic: Oxycodone IR 20 mg every 8 hours (60 mg/day) MME/day:90 mg/day Janett Billow, RN  08/01/2018  3:59 PM  Sign at close encounter Upon discharge swelling approx 4 cm wide noted at the top insertion site.  No active bleeding noted.  Notified Dr Dossie Arbour, instructed to apply pressure dressing.  4x4''s folded with elasiplast applied to affected area.  Patient denies pain at that site.  Instructed that she could start her blood thinner back tomorrow. Patient verbalizes u/o information.   Chauncey Fischer, RN  08/01/2018  1:04 PM  Sign at close encounter Nursing Pain Medication Assessment:  Safety precautions to be maintained throughout the outpatient stay will include: orient to surroundings, keep bed in low position, maintain call bell within reach at all times, provide assistance with transfer out of bed and ambulation.  Medication Inspection Compliance: Pill count conducted under aseptic conditions, in front of the patient. Neither the pills nor the bottle was removed from the patient's sight at any time. Once count was completed pills were immediately returned to the patient in their original bottle.  Medication: Oxycodone IR Pill/Patch Count: 9 of 90 pills remain Pill/Patch Appearance: Markings consistent with prescribed medication Bottle Appearance: Standard pharmacy container. Clearly labeled. Filled Date: 77 / 14 / 2019 Last Medication intake:  Today   Pharmacokinetics: Liberation and absorption (onset of action): WNL Distribution (time to peak effect): WNL Metabolism and excretion (duration of action): WNL         Pharmacodynamics: Desired effects: Analgesia: Ms. Vacca reports >50% benefit. Functional ability: Patient reports that medication allows her to accomplish basic ADLs Clinically  meaningful improvement in function (CMIF): Sustained CMIF goals met Perceived effectiveness: Described as relatively effective, allowing for increase in activities of daily living (ADL) Undesirable effects: Side-effects or Adverse reactions: None reported Monitoring: [place] PMP: Online review of the past 80-monthperiod conducted. Compliant with practice rules and regulations Last UDS on record: Summary  Date Value Ref Range Status  02/01/2018 FINAL  Final    Comment:    ==================================================================== TOXASSURE SELECT 13 (MW) ==================================================================== Test                             Result       Flag       Units Drug Present and Declared for Prescription Verification   Oxycodone                      401          EXPECTED   ng/mg creat   Oxymorphone                    438          EXPECTED   ng/mg creat   Noroxycodone                   1122         EXPECTED   ng/mg creat   Noroxymorphone                 309          EXPECTED   ng/mg creat    Sources of oxycodone are scheduled prescription medications.    Oxymorphone, noroxycodone, and noroxymorphone are expected    metabolites of oxycodone. Oxymorphone is also available as a    scheduled prescription medication. ==================================================================== Test                      Result    Flag   Units      Ref Range   Creatinine              79               mg/dL      >=20 ==================================================================== Declared Medications:  The flagging and interpretation on this report are based on the  following declared medications.  Unexpected results may arise from  inaccuracies in the declared medications.  **Note: The testing scope of this panel includes these medications:  Oxycodone  **Note: The testing scope of this panel does not include following  reported medications:  Albuterol (Proventil)   Apixaban  Bisacodyl  Furosemide (Lasix)  Lisinopril  Melatonin  Polyethylene Glycol (MiraLAX)  Ropinirole  Vitamin D ==================================================================== For clinical consultation, please call ([phone] ====================================================================    UDS interpretation: Compliant          Medication Assessment Form: Reviewed. Patient indicates being compliant with therapy Treatment compliance: Compliant Risk Assessment Profile: Aberrant behavior: See prior evaluations. None observed or detected today Comorbid factors increasing risk of overdose: See prior notes. No additional risks detected today Opioid risk tool (ORT) (Total Score): 0 Personal History of Substance  Abuse (SUD-Substance use disorder):  Alcohol: Negative  Illegal Drugs: Negative  Rx Drugs: Negative  ORT Risk Level calculation: Low Risk Risk of substance use disorder (SUD): Low Opioid Risk Tool - 08/01/18 1254      Family History of Substance Abuse   Alcohol  Negative    Illegal Drugs  Negative    Rx Drugs  Negative      Personal History of Substance Abuse   Alcohol  Negative    Illegal Drugs  Negative    Rx Drugs  Negative      Age   Age between 95-45 years   No      History of Preadolescent Sexual Abuse   History of Preadolescent Sexual Abuse  Negative or Female      Psychological Disease   Psychological Disease  Negative    Depression  Negative      Total Score   Opioid Risk Tool Scoring  0    Opioid Risk Interpretation  Low Risk      ORT Scoring interpretation table:  Score <3 = Low Risk for SUD  Score between 4-7 = Moderate Risk for SUD  Score >8 = High Risk for Opioid Abuse   Risk Mitigation Strategies:  Patient Counseling: Covered Patient-Prescriber Agreement (PPA): Present and active  Notification to other healthcare providers: Done  Pharmacologic Plan: No change in therapy, at this time.              Pre-op Assessment:    Ms. Ozawa is a 84 y.o. (year old), female patient, seen today for interventional treatment. She  has a past surgical history that includes Abdominal hysterectomy; Back surgery; Rotator cuff repair; Breast surgery; Cataract extraction w/PHACO (10/19/2011); Cataract extraction w/PHACO (11/02/2011); Carpal tunnel release; Joint replacement; Colon surgery; Pacemaker insertion (N/A, 12/24/2015); Cholecystectomy; Breast lumpectomy (Right, 2004); and Insert / replace / remove pacemaker. Ms. Kregel has a current medication list which includes the following prescription(s): albuterol, apixaban, bisacodyl, vitamin d, duloxetine, fluconazole, furosemide, levocetirizine, lisinopril, melatonin, nystatin, oxycodone hcl, oxygen-helium, ropinirole, hydrocodone-acetaminophen, hydrocodone-acetaminophen, oxycodone hcl, oxycodone hcl, and polyethylene glycol, and the following Facility-Administered Medications: fentanyl and midazolam. Her primarily concern today is the Back Pain  Initial Vital Signs:  Pulse/HCG Rate: 69ECG Heart Rate: 60 Temp: (!) 96.3 F (35.7 C) BP: 123/86 SpO2: 97 %  BMI: Estimated body mass index is 23.73 kg/m as calculated from the following:   Height as of this encounter: '5\' 6"'  (1.676 m).   Weight as of this encounter: 147 lb (66.7 kg).  Risk Assessment: Allergies: Reviewed. She is allergic to flexeril [cyclobenzaprine hcl] and vancomycin.  Allergy Precautions: None required Coagulopathies: Reviewed. None identified.  Blood-thinner therapy: None at this time Active Infection(s): Reviewed. None identified. Ms. Wegner is afebrile  Site Confirmation: Ms. Iman was asked to confirm the procedure and laterality before marking the site Procedure checklist: Completed Consent: Before the procedure and under the influence of no sedative(s), amnesic(s), or anxiolytics, the patient was informed of the treatment options, risks and possible complications. To fulfill our ethical and legal obligations, as  recommended by the American Medical Association's Code of Ethics, I have informed the patient of my clinical impression; the nature and purpose of the treatment or procedure; the risks, benefits, and possible complications of the intervention; the alternatives, including doing nothing; the risk(s) and benefit(s) of the alternative treatment(s) or procedure(s); and the risk(s) and benefit(s) of doing nothing. The patient was provided information about the general risks and possible complications associated with the  procedure. These may include, but are not limited to: failure to achieve desired goals, infection, bleeding, organ or nerve damage, allergic reactions, paralysis, and death. In addition, the patient was informed of those risks and complications associated to Spine-related procedures, such as failure to decrease pain; infection (i.e.: Meningitis, epidural or intraspinal abscess); bleeding (i.e.: epidural hematoma, subarachnoid hemorrhage, or any other type of intraspinal or peri-dural bleeding); organ or nerve damage (i.e.: Any type of peripheral nerve, nerve root, or spinal cord injury) with subsequent damage to sensory, motor, and/or autonomic systems, resulting in permanent pain, numbness, and/or weakness of one or several areas of the body; allergic reactions; (i.e.: anaphylactic reaction); and/or death. Furthermore, the patient was informed of those risks and complications associated with the medications. These include, but are not limited to: allergic reactions (i.e.: anaphylactic or anaphylactoid reaction(s)); adrenal axis suppression; blood sugar elevation that in diabetics may result in ketoacidosis or comma; water retention that in patients with history of congestive heart failure may result in shortness of breath, pulmonary edema, and decompensation with resultant heart failure; weight gain; swelling or edema; medication-induced neural toxicity; particulate matter embolism and blood vessel  occlusion with resultant organ, and/or nervous system infarction; and/or aseptic necrosis of one or more joints. Finally, the patient was informed that Medicine is not an exact science; therefore, there is also the possibility of unforeseen or unpredictable risks and/or possible complications that may result in a catastrophic outcome. The patient indicated having understood very clearly. We have given the patient no guarantees and we have made no promises. Enough time was given to the patient to ask questions, all of which were answered to the patient's satisfaction. Ms. Lewey has indicated that she wanted to continue with the procedure. Attestation: I, the ordering provider, attest that I have discussed with the patient the benefits, risks, side-effects, alternatives, likelihood of achieving goals, and potential problems during recovery for the procedure that I have provided informed consent. Date  Time: 08/01/2018 12:50 PM  Pre-Procedure Preparation:  Monitoring: As per clinic protocol. Respiration, ETCO2, SpO2, BP, heart rate and rhythm monitor placed and checked for adequate function Safety Precautions: Patient was assessed for positional comfort and pressure points before starting the procedure. Time-out: I initiated and conducted the "Time-out" before starting the procedure, as per protocol. The patient was asked to participate by confirming the accuracy of the "Time Out" information. Verification of the correct person, site, and procedure were performed and confirmed by me, the nursing staff, and the patient. "Time-out" conducted as per Joint Commission's Universal Protocol (UP.01.01.01). Time: 1425  Description of Procedure:          Laterality: Left Levels:  L2, L3, L4, L5, & S1 Medial Branch Level(s), at the L3-4, L4-5, and the L5-S1 lumbar facet joints. Area Prepped: Lumbosacral Prepping solution: ChloraPrep (2% chlorhexidine gluconate and 70% isopropyl alcohol) Safety Precautions:  Aspiration looking for blood return was conducted prior to all injections. At no point did we inject any substances, as a needle was being advanced. Before injecting, the patient was told to immediately notify me if she was experiencing any new onset of "ringing in the ears, or metallic taste in the mouth". No attempts were made at seeking any paresthesias. Safe injection practices and needle disposal techniques used. Medications properly checked for expiration dates. SDV (single dose vial) medications used. After the completion of the procedure, all disposable equipment used was discarded in the proper designated medical waste containers. Local Anesthesia: Protocol guidelines were followed. The patient was  positioned over the fluoroscopy table. The area was prepped in the usual manner. The time-out was completed. The target area was identified using fluoroscopy. A 12-in long, straight, sterile hemostat was used with fluoroscopic guidance to locate the targets for each level blocked. Once located, the skin was marked with an approved surgical skin marker. Once all sites were marked, the skin (epidermis, dermis, and hypodermis), as well as deeper tissues (fat, connective tissue and muscle) were infiltrated with a small amount of a short-acting local anesthetic, loaded on a 10cc syringe with a 25G, 1.5-in  Needle. An appropriate amount of time was allowed for local anesthetics to take effect before proceeding to the next step. Local Anesthetic: Lidocaine 2.0% The unused portion of the local anesthetic was discarded in the proper designated containers. Technical explanation of process:  Radiofrequency Ablation (RFA) L2 Medial Branch Nerve RFA: The target area for the L2 medial branch is at the junction of the postero-lateral aspect of the superior articular process and the superior, posterior, and medial edge of the transverse process of L3. Under fluoroscopic guidance, a Radiofrequency needle was inserted until  contact was made with os over the superior postero-lateral aspect of the pedicular shadow (target area). Sensory and motor testing was conducted to properly adjust the position of the needle. Once satisfactory placement of the needle was achieved, the numbing solution was slowly injected after negative aspiration for blood. 2.0 mL of the nerve block solution was injected without difficulty or complication. After waiting for at least 3 minutes, the ablation was performed. Once completed, the needle was removed intact. L3 Medial Branch Nerve RFA: The target area for the L3 medial branch is at the junction of the postero-lateral aspect of the superior articular process and the superior, posterior, and medial edge of the transverse process of L4. Under fluoroscopic guidance, a Radiofrequency needle was inserted until contact was made with os over the superior postero-lateral aspect of the pedicular shadow (target area). Sensory and motor testing was conducted to properly adjust the position of the needle. Once satisfactory placement of the needle was achieved, the numbing solution was slowly injected after negative aspiration for blood. 2.0 mL of the nerve block solution was injected without difficulty or complication. After waiting for at least 3 minutes, the ablation was performed. Once completed, the needle was removed intact. L4 Medial Branch Nerve RFA: The target area for the L4 medial branch is at the junction of the postero-lateral aspect of the superior articular process and the superior, posterior, and medial edge of the transverse process of L5. Under fluoroscopic guidance, a Radiofrequency needle was inserted until contact was made with os over the superior postero-lateral aspect of the pedicular shadow (target area). Sensory and motor testing was conducted to properly adjust the position of the needle. Once satisfactory placement of the needle was achieved, the numbing solution was slowly injected after  negative aspiration for blood. 2.0 mL of the nerve block solution was injected without difficulty or complication. After waiting for at least 3 minutes, the ablation was performed. Once completed, the needle was removed intact. L5 Medial Branch Nerve RFA: The target area for the L5 medial branch is at the junction of the postero-lateral aspect of the superior articular process of S1 and the superior, posterior, and medial edge of the sacral ala. Under fluoroscopic guidance, a Radiofrequency needle was inserted until contact was made with os over the superior postero-lateral aspect of the pedicular shadow (target area). Sensory and motor testing was conducted to  properly adjust the position of the needle. Once satisfactory placement of the needle was achieved, the numbing solution was slowly injected after negative aspiration for blood. 2.0 mL of the nerve block solution was injected without difficulty or complication. After waiting for at least 3 minutes, the ablation was performed. Once completed, the needle was removed intact. S1 Medial Branch Nerve RFA: The target area for the S1 medial branch is located inferior to the junction of the S1 superior articular process and the L5 inferior articular process, posterior, inferior, and lateral to the 6 o'clock position of the L5-S1 facet joint, just superior to the S1 posterior foramen. Under fluoroscopic guidance, the Radiofrequency needle was advanced until contact was made with os over the Target area. Sensory and motor testing was conducted to properly adjust the position of the needle. Once satisfactory placement of the needle was achieved, the numbing solution was slowly injected after negative aspiration for blood. 2.0 mL of the nerve block solution was injected without difficulty or complication. After waiting for at least 3 minutes, the ablation was performed. Once completed, the needle was removed intact. Radiofrequency lesioning (ablation):  Radiofrequency  Generator: NeuroTherm [id number] Sensory Stimulation Parameters: 50 Hz was used to locate & identify the nerve, making sure that the needle was positioned such that there was no sensory stimulation below 0.3 V or above 0.7 V. Motor Stimulation Parameters: 2 Hz was used to evaluate the motor component. Care was taken not to lesion any nerves that demonstrated motor stimulation of the lower extremities at an output of less than 2.5 times that of the sensory threshold, or a maximum of 2.0 V. Lesioning Technique Parameters: Standard Radiofrequency settings. (Not bipolar or pulsed.) Temperature Settings: 80 degrees C Lesioning time: 60 seconds Intra-operative Compliance: Compliant Materials & Medications: Needle(s) (Electrode/Cannula) Type: Teflon-coated, curved tip, Radiofrequency needle(s) Gauge: 22G Length: 10cm Numbing solution: 0.2% PF-Ropivacaine + Triamcinolone (40 mg/mL) diluted to a final concentration of 4 mg of Triamcinolone/mL of Ropivacaine The unused portion of the solution was discarded in the proper designated containers.  Once the entire procedure was completed, the treated area was cleaned, making sure to leave some of the prepping solution back to take advantage of its long term bactericidal properties.  Illustration of the posterior view of the lumbar spine and the posterior neural structures. Laminae of L2 through S1 are labeled. DPRL5, dorsal primary ramus of L5; DPRS1, dorsal primary ramus of S1; DPR3, dorsal primary ramus of L3; FJ, facet (zygapophyseal) joint L3-L4; I, inferior articular process of L4; LB1, lateral branch of dorsal primary ramus of L1; IAB, inferior articular branches from L3 medial branch (supplies L4-L5 facet joint); IBP, intermediate branch plexus; MB3, medial branch of dorsal primary ramus of L3; NR3, third lumbar nerve root; S, superior articular process of L5; SAB, superior articular branches from L4 (supplies L4-5 facet joint also); TP3, transverse process of  L3.  Vitals:   08/01/18 1522 08/01/18 1528 08/01/18 1533 08/01/18 1538  BP:  128/77  138/81  Pulse:      Resp: '12 17  13  ' Temp:    (!) 97.2 F (36.2 C)  TempSrc:    Temporal  SpO2:  99% 100% 100%  Weight:      Height:        Start Time: 1425 hrs. End Time: 1505 hrs.  Imaging Guidance (Spinal):          Type of Imaging Technique: Fluoroscopy Guidance (Spinal) Indication(s): Assistance in needle guidance and placement for procedures requiring  needle placement in or near specific anatomical locations not easily accessible without such assistance. Exposure Time: Please see nurses notes. Contrast: None used. Fluoroscopic Guidance: I was personally present during the use of fluoroscopy. "Tunnel Vision Technique" used to obtain the best possible view of the target area. Parallax error corrected before commencing the procedure. "Direction-depth-direction" technique used to introduce the needle under continuous pulsed fluoroscopy. Once target was reached, antero-posterior, oblique, and lateral fluoroscopic projection used confirm needle placement in all planes. Images permanently stored in EMR. Interpretation: No contrast injected. I personally interpreted the imaging intraoperatively. Adequate needle placement confirmed in multiple planes. Permanent images saved into the patient's record.  Antibiotic Prophylaxis:   Anti-infectives (From admission, onward)   None     Indication(s): None identified  Post-operative Assessment:  Post-procedure Vital Signs:  Pulse/HCG Rate: 6960 Temp: (!) 97.2 F (36.2 C) Resp: 13 BP: 138/81 SpO2: 100 %  EBL: None  Complications: No immediate post-treatment complications observed by team, or reported by patient.  Note: The patient tolerated the entire procedure well. A repeat set of vitals were taken after the procedure and the patient was kept under observation following institutional policy, for this type of procedure. Post-procedural neurological  assessment was performed, showing return to baseline, prior to discharge. The patient was provided with post-procedure discharge instructions, including a section on how to identify potential problems. Should any problems arise concerning this procedure, the patient was given instructions to immediately contact us, at any time, without hesitation. In any case, we plan to contact the patient by telephone for a follow-up status report regarding this interventional procedure.  Comments:  No additional relevant information.  Plan of Care    Imaging Orders     DG C-Arm 1-60 Min-No Report  Procedure Orders     Radiofrequency,Lumbar     Radiofrequency,Lumbar  Medications ordered for procedure: Meds ordered this encounter  Medications  . lidocaine (XYLOCAINE) 2 % (with pres) injection 400 mg  . midazolam (VERSED) 5 MG/5ML injection 1-2 mg    Make sure Flumazenil is available in the pyxis when using this medication. If oversedation occurs, administer 0.2 mg IV over 15 sec. If after 45 sec no response, administer 0.2 mg again over 1 min; may repeat at 1 min intervals; not to exceed 4 doses (1 mg)  . fentaNYL (SUBLIMAZE) injection 25-50 mcg    Make sure Narcan is available in the pyxis when using this medication. In the event of respiratory depression (RR< 8/min): Titrate NARCAN (naloxone) in increments of 0.1 to 0.2 mg IV at 2-3 minute intervals, until desired degree of reversal.  . lactated ringers infusion 1,000 mL  . ropivacaine (PF) 2 mg/mL (0.2%) (NAROPIN) injection 9 mL  . triamcinolone acetonide (KENALOG-40) injection 40 mg  . HYDROcodone-acetaminophen (NORCO/VICODIN) 5-325 MG tablet    Sig: Take 1 tablet by mouth every 8 (eight) hours as needed for up to 7 days for severe pain. Must last 7 days.    Dispense:  21 tablet    Refill:  0    For acute post-operative pain. Not to be refilled. Must last 7 days.  Marland Kitchen HYDROcodone-acetaminophen (NORCO/VICODIN) 5-325 MG tablet    Sig: Take 1 tablet  by mouth every 8 (eight) hours as needed for up to 7 days for severe pain. Must last for 7 days.    Dispense:  21 tablet    Refill:  0    For acute post-operative pain. Not to be refilled. Must last 7 days.  Marland Kitchen  Oxycodone HCl 20 MG TABS    Sig: Take 1 tablet (20 mg total) by mouth every 8 (eight) hours as needed.    Dispense:  90 tablet    Refill:  0    [place] STOP ACT - Not applicable. Fill one day early if pharmacy is closed on scheduled refill date. Do not fill until: 10/04/18. Must last 30 days. To last until: 11/03/18.  Marland Kitchen Oxycodone HCl 20 MG TABS    Sig: Take 1 tablet (20 mg total) by mouth every 8 (eight) hours as needed.    Dispense:  90 tablet    Refill:  0    [place] STOP ACT - Not applicable. Fill one day early if pharmacy is closed on scheduled refill date. Do not fill until: 09/04/18. Must last 30 days. To last until: 10/04/18.  Marland Kitchen Oxycodone HCl 20 MG TABS    Sig: Take 1 tablet (20 mg total) by mouth every 8 (eight) hours as needed.    Dispense:  90 tablet    Refill:  0    [place] STOP ACT - Not applicable. Fill one day early if pharmacy is closed on scheduled refill date. Do not fill until: 08/05/18. Must last 30 days. To last until: 09/04/18.   Medications administered: We administered lidocaine, midazolam, fentaNYL, lactated ringers, ropivacaine (PF) 2 mg/mL (0.2%), and triamcinolone acetonide.  See the medical record for exact dosing, route, and time of administration.  Disposition: Discharge home  Discharge Date & Time: 08/01/2018; 1556 hrs.   Physician-requested Follow-up: Return for contralateral RFA (2 wks): (R) L-FCT RFA.  Future Appointments  Date Time Provider [place]  10/26/2018 10:00 AM Mikey College, NP [facility] None  10/31/2018 11:15 AM Vevelyn Francois, NP [facility] None  05/08/2019 10:45 AM [place] ADVISOR [facility] None   Primary Care Physician: Mikey College, NP Location: [facility] Note by: Gaspar Cola, MD Date: 08/01/2018; Time: 4:06 PM  Disclaimer:  Medicine is not an Chief Strategy Officer. The only guarantee in medicine is that nothing is guaranteed. It is important to note that the decision to proceed with this intervention was based on the information collected from the patient. The Data and conclusions were drawn from the patient's questionnaire, the interview, and the physical examination. Because the information was provided in large part by the patient, it cannot be guaranteed that it has not been purposely or unconsciously manipulated. Every effort has been made to obtain as much relevant data as possible for this evaluation. It is important to note that the conclusions that lead to this procedure are derived in large part from the available data. Always take into account that the treatment will also be dependent on availability of resources and existing treatment guidelines, considered by other Pain Management Practitioners as being common knowledge and practice, at the time of the intervention. For Medico-Legal purposes, it is also important to point out that variation in procedural techniques and pharmacological choices are the acceptable norm. The indications, contraindications, technique, and results of the above procedure should only be interpreted and judged by a Board-Certified Interventional Pain Specialist with extensive familiarity and expertise in the same exact procedure and technique.

## 2018-08-02 ENCOUNTER — Telehealth: Payer: Self-pay | Admitting: *Deleted

## 2018-08-02 ENCOUNTER — Encounter: Payer: Medicare Other | Admitting: Nurse Practitioner

## 2018-08-02 NOTE — Telephone Encounter (Signed)
No problems post procedure. 

## 2018-08-28 ENCOUNTER — Other Ambulatory Visit: Payer: Self-pay | Admitting: Nurse Practitioner

## 2018-08-28 DIAGNOSIS — G2581 Restless legs syndrome: Secondary | ICD-10-CM

## 2018-09-19 ENCOUNTER — Encounter: Payer: Self-pay | Admitting: Pain Medicine

## 2018-09-19 ENCOUNTER — Ambulatory Visit (HOSPITAL_BASED_OUTPATIENT_CLINIC_OR_DEPARTMENT_OTHER): Payer: Medicare Other | Admitting: Pain Medicine

## 2018-09-19 ENCOUNTER — Other Ambulatory Visit: Payer: Self-pay

## 2018-09-19 ENCOUNTER — Other Ambulatory Visit: Payer: Self-pay | Admitting: Pain Medicine

## 2018-09-19 ENCOUNTER — Ambulatory Visit
Admission: RE | Admit: 2018-09-19 | Discharge: 2018-09-19 | Disposition: A | Discharge status: Home / Self Care | Payer: Medicare Other | Source: Ambulatory Visit | Attending: Pain Medicine | Admitting: Pain Medicine

## 2018-09-19 VITALS — BP 129/91 | HR 83 | Temp 97.3°F | Resp 10 | Ht 67.0 in | Wt 147.0 lb

## 2018-09-19 DIAGNOSIS — G4700 Insomnia, unspecified: Secondary | ICD-10-CM

## 2018-09-19 DIAGNOSIS — Z7901 Long term (current) use of anticoagulants: Secondary | ICD-10-CM | POA: Insufficient documentation

## 2018-09-19 DIAGNOSIS — G2581 Restless legs syndrome: Secondary | ICD-10-CM | POA: Insufficient documentation

## 2018-09-19 DIAGNOSIS — M5441 Lumbago with sciatica, right side: Secondary | ICD-10-CM

## 2018-09-19 DIAGNOSIS — M25512 Pain in left shoulder: Secondary | ICD-10-CM

## 2018-09-19 DIAGNOSIS — M4310 Spondylolisthesis, site unspecified: Secondary | ICD-10-CM | POA: Diagnosis present

## 2018-09-19 DIAGNOSIS — M47816 Spondylosis without myelopathy or radiculopathy, lumbar region: Secondary | ICD-10-CM

## 2018-09-19 DIAGNOSIS — G8929 Other chronic pain: Secondary | ICD-10-CM | POA: Insufficient documentation

## 2018-09-19 DIAGNOSIS — M4802 Spinal stenosis, cervical region: Secondary | ICD-10-CM

## 2018-09-19 DIAGNOSIS — M47817 Spondylosis without myelopathy or radiculopathy, lumbosacral region: Secondary | ICD-10-CM | POA: Insufficient documentation

## 2018-09-19 DIAGNOSIS — M961 Postlaminectomy syndrome, not elsewhere classified: Secondary | ICD-10-CM

## 2018-09-19 DIAGNOSIS — K5903 Drug induced constipation: Secondary | ICD-10-CM

## 2018-09-19 DIAGNOSIS — G8918 Other acute postprocedural pain: Secondary | ICD-10-CM

## 2018-09-19 DIAGNOSIS — M5442 Lumbago with sciatica, left side: Secondary | ICD-10-CM

## 2018-09-19 DIAGNOSIS — Z96619 Presence of unspecified artificial shoulder joint: Secondary | ICD-10-CM

## 2018-09-19 DIAGNOSIS — T402X5A Adverse effect of other opioids, initial encounter: Secondary | ICD-10-CM | POA: Diagnosis present

## 2018-09-19 DIAGNOSIS — M25519 Pain in unspecified shoulder: Secondary | ICD-10-CM

## 2018-09-19 DIAGNOSIS — M5137 Other intervertebral disc degeneration, lumbosacral region: Secondary | ICD-10-CM | POA: Diagnosis present

## 2018-09-19 DIAGNOSIS — M5412 Radiculopathy, cervical region: Secondary | ICD-10-CM

## 2018-09-19 DIAGNOSIS — M5030 Other cervical disc degeneration, unspecified cervical region: Secondary | ICD-10-CM

## 2018-09-19 DIAGNOSIS — M25511 Pain in right shoulder: Secondary | ICD-10-CM

## 2018-09-19 MED ORDER — LACTATED RINGERS IV SOLN
1000.0000 mL | Freq: Once | INTRAVENOUS | Status: AC
  Administered 2018-09-19: 1000 mL via INTRAVENOUS

## 2018-09-19 MED ORDER — LIDOCAINE HCL 2 % IJ SOLN
20.0000 mL | Freq: Once | INTRAMUSCULAR | Status: AC
  Administered 2018-09-19: 400 mg
  Filled 2018-09-19: qty 40

## 2018-09-19 MED ORDER — TRIAMCINOLONE ACETONIDE 40 MG/ML IJ SUSP
40.0000 mg | Freq: Once | INTRAMUSCULAR | Status: AC
  Administered 2018-09-19: 40 mg
  Filled 2018-09-19: qty 1

## 2018-09-19 MED ORDER — MIDAZOLAM HCL 5 MG/5ML IJ SOLN
1.0000 mg | INTRAMUSCULAR | Status: DC | PRN
  Filled 2018-09-19: qty 5

## 2018-09-19 MED ORDER — ROPINIROLE HCL 2 MG PO TABS: 4.0000 mg | ORAL_TABLET | Freq: Every day | ORAL | 2 refills | Status: DC

## 2018-09-19 MED ORDER — FENTANYL CITRATE (PF) 100 MCG/2ML IJ SOLN
25.0000 ug | INTRAMUSCULAR | Status: DC | PRN
  Filled 2018-09-19: qty 2

## 2018-09-19 MED ORDER — HYDROCODONE-ACETAMINOPHEN 5-325 MG PO TABS: 1.0000 | ORAL_TABLET | Freq: Three times a day (TID) | ORAL | 0 refills | Status: AC | PRN

## 2018-09-19 MED ORDER — POLYETHYLENE GLYCOL 3350 17 G PO PACK: 17.0000 g | PACK | Freq: Every day | ORAL | 2 refills | Status: DC | PRN

## 2018-09-19 MED ORDER — ROPIVACAINE HCL 2 MG/ML IJ SOLN
9.0000 mL | Freq: Once | INTRAMUSCULAR | Status: AC
  Administered 2018-09-19: 9 mL via PERINEURAL
  Filled 2018-09-19: qty 10

## 2018-09-19 MED ORDER — MELATONIN 10 MG PO CAPS: 20.0000 mg | ORAL_CAPSULE | Freq: Every evening | ORAL | 2 refills | Status: DC | PRN

## 2018-09-19 NOTE — Progress Notes (Signed)
Patient's Name: Kristin [name]  MRN: [id number]  Referring Provider: Milinda Pointer, MD  DOB: Jul 05, 1930  PCP: Mikey College, NP  DOS: 09/19/2018  Note by: Gaspar Cola, MD  Service setting: Ambulatory outpatient  Specialty: Interventional Pain Management  Patient type: Established  Location: [facility] (AMB) Pain Management Facility  Visit type: Interventional Procedure   Primary Reason for Visit: Interventional Pain Management Treatment. CC: Back Pain (lower)  Procedure:          Anesthesia, Analgesia, Anxiolysis:  Type: Thermal Lumbar Facet, Medial Branch Radiofrequency Ablation/Neurotomy  #1  Primary Purpose: Therapeutic Region: Posterolateral Lumbosacral Spine Level: L2, L3, L4, L5, & S1 Medial Branch Level(s). These levels will denervate the L3-4, L4-5, and the L5-S1 lumbar facet joints. Laterality: Right  Type: Moderate (Conscious) Sedation combined with Local Anesthesia Indication(s): Analgesia and Anxiety Route: Intravenous (IV) IV Access: Secured Sedation: Meaningful verbal contact was maintained at all times during the procedure  Local Anesthetic: Lidocaine 1-2%  Position: Prone   Indications: 1. Spondylosis without myelopathy or radiculopathy, lumbosacral region   2. Lumbar facet syndrome (Bilateral) (L>R)   3. Lumbar facet hypertrophy (L1-2, L2-3, and L4-5)   4. Lumbar spondylosis   5. DDD (degenerative disc disease), lumbosacral   6. 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 Chronic low back pain (Primary Source of Pain) (Bilateral) (L>R)    Kristin [name] has been dealing with the above chronic pain for longer than three months and has either failed to respond, was unable to tolerate, or simply did not get enough benefit from other more conservative therapies including, but not limited to: 1. Over-the-counter medications 2. Anti-inflammatory medications 3. Muscle relaxants 4. Membrane stabilizers 5. Opioids 6. Physical therapy  and/or chiropractic manipulation 7. Modalities (Heat, ice, etc.) 8. Invasive techniques such as nerve blocks. Kristin [name] has attained more than 50% relief of the pain from a series of diagnostic injections conducted in separate occasions.  Pain Score: Pre-procedure: 5 /10 Post-procedure: 0-No pain/10  Pre-op Assessment:  Kristin [name] is a 85 y.o. (year old), female patient, seen today for interventional treatment. She  has a past surgical history that includes Abdominal hysterectomy; Back surgery; Rotator cuff repair; Breast surgery; Cataract extraction w/PHACO (10/19/2011); Cataract extraction w/PHACO (11/02/2011); Carpal tunnel release; Joint replacement; Colon surgery; Pacemaker insertion (N/A, 12/24/2015); Cholecystectomy; Breast lumpectomy (Right, 2004); and Insert / replace / remove pacemaker. Kristin [name] has a current medication list which includes the following prescription(s): albuterol, apixaban, bisacodyl, vitamin d, duloxetine, furosemide, levocetirizine, lisinopril, melatonin, nystatin, oxycodone hcl, oxycodone hcl, oxygen-helium, ropinirole, fluconazole, hydrocodone-acetaminophen, hydrocodone-acetaminophen, oxycodone hcl, and polyethylene glycol, and the following Facility-Administered Medications: fentanyl and midazolam. Her primarily concern today is the Back Pain (lower)  Initial Vital Signs:  Pulse/HCG Rate: 83ECG Heart Rate: 61 Temp: 97.9 F (36.6 C) Resp: 18 BP: 111/88 SpO2: 97 %  BMI: Estimated body mass index is 23.02 kg/m as calculated from the following:   Height as of this encounter: 5\' 7"  (1.702 m).   Weight as of this encounter: 147 lb (66.7 kg).  Risk Assessment: Allergies: Reviewed. She is allergic to flexeril [cyclobenzaprine hcl] and vancomycin.  Allergy Precautions: None required Coagulopathies: Reviewed. None identified.  Blood-thinner therapy: None at this time Active Infection(s): Reviewed. None identified. Kristin [name] is afebrile  Site Confirmation: Ms.  [name] was asked to confirm the procedure and laterality before marking the site Procedure checklist: Completed Consent: Before the procedure and under the influence of no sedative(s), amnesic(s), or anxiolytics, the  patient was informed of the treatment options, risks and possible complications. To fulfill our ethical and legal obligations, as recommended by the American Medical Association's Code of Ethics, I have informed the patient of my clinical impression; the nature and purpose of the treatment or procedure; the risks, benefits, and possible complications of the intervention; the alternatives, including doing nothing; the risk(s) and benefit(s) of the alternative treatment(s) or procedure(s); and the risk(s) and benefit(s) of doing nothing. The patient was provided information about the general risks and possible complications associated with the procedure. These may include, but are not limited to: failure to achieve desired goals, infection, bleeding, organ or nerve damage, allergic reactions, paralysis, and death. In addition, the patient was informed of those risks and complications associated to Spine-related procedures, such as failure to decrease pain; infection (i.e.: Meningitis, epidural or intraspinal abscess); bleeding (i.e.: epidural hematoma, subarachnoid hemorrhage, or any other type of intraspinal or peri-dural bleeding); organ or nerve damage (i.e.: Any type of peripheral nerve, nerve root, or spinal cord injury) with subsequent damage to sensory, motor, and/or autonomic systems, resulting in permanent pain, numbness, and/or weakness of one or several areas of the body; allergic reactions; (i.e.: anaphylactic reaction); and/or death. Furthermore, the patient was informed of those risks and complications associated with the medications. These include, but are not limited to: allergic reactions (i.e.: anaphylactic or anaphylactoid reaction(s)); adrenal axis suppression; blood sugar  elevation that in diabetics may result in ketoacidosis or comma; water retention that in patients with history of congestive heart failure may result in shortness of breath, pulmonary edema, and decompensation with resultant heart failure; weight gain; swelling or edema; medication-induced neural toxicity; particulate matter embolism and blood vessel occlusion with resultant organ, and/or nervous system infarction; and/or aseptic necrosis of one or more joints. Finally, the patient was informed that Medicine is not an exact science; therefore, there is also the possibility of unforeseen or unpredictable risks and/or possible complications that may result in a catastrophic outcome. The patient indicated having understood very clearly. We have given the patient no guarantees and we have made no promises. Enough time was given to the patient to ask questions, all of which were answered to the patient's satisfaction. Ms. Badami has indicated that she wanted to continue with the procedure. Attestation: I, the ordering provider, attest that I have discussed with the patient the benefits, risks, side-effects, alternatives, likelihood of achieving goals, and potential problems during recovery for the procedure that I have provided informed consent. Date  Time: 09/19/2018 10:27 AM  Pre-Procedure Preparation:  Monitoring: As per clinic protocol. Respiration, ETCO2, SpO2, BP, heart rate and rhythm monitor placed and checked for adequate function Safety Precautions: Patient was assessed for positional comfort and pressure points before starting the procedure. Time-out: I initiated and conducted the "Time-out" before starting the procedure, as per protocol. The patient was asked to participate by confirming the accuracy of the "Time Out" information. Verification of the correct person, site, and procedure were performed and confirmed by me, the nursing staff, and the patient. "Time-out" conducted as per Joint  Commission's Universal Protocol (UP.01.01.01). Time: 1107  Description of Procedure:          Laterality: Right Levels:  L2, L3, L4, L5, & S1 Medial Branch Level(s), at the L3-4, L4-5, and the L5-S1 lumbar facet joints. Area Prepped: Lumbosacral Prepping solution: ChloraPrep (2% chlorhexidine gluconate and 70% isopropyl alcohol) Safety Precautions: Aspiration looking for blood return was conducted prior to all injections. At no point did we  inject any substances, as a needle was being advanced. Before injecting, the patient was told to immediately notify me if she was experiencing any new onset of "ringing in the ears, or metallic taste in the mouth". No attempts were made at seeking any paresthesias. Safe injection practices and needle disposal techniques used. Medications properly checked for expiration dates. SDV (single dose vial) medications used. After the completion of the procedure, all disposable equipment used was discarded in the proper designated medical waste containers. Local Anesthesia: Protocol guidelines were followed. The patient was positioned over the fluoroscopy table. The area was prepped in the usual manner. The time-out was completed. The target area was identified using fluoroscopy. A 12-in long, straight, sterile hemostat was used with fluoroscopic guidance to locate the targets for each level blocked. Once located, the skin was marked with an approved surgical skin marker. Once all sites were marked, the skin (epidermis, dermis, and hypodermis), as well as deeper tissues (fat, connective tissue and muscle) were infiltrated with a small amount of a short-acting local anesthetic, loaded on a 10cc syringe with a 25G, 1.5-in  Needle. An appropriate amount of time was allowed for local anesthetics to take effect before proceeding to the next step. Local Anesthetic: Lidocaine 2.0% The unused portion of the local anesthetic was discarded in the proper designated containers. Technical  explanation of process:  Radiofrequency Ablation (RFA) L2 Medial Branch Nerve RFA: The target area for the L2 medial branch is at the junction of the postero-lateral aspect of the superior articular process and the superior, posterior, and medial edge of the transverse process of L3. Under fluoroscopic guidance, a Radiofrequency needle was inserted until contact was made with os over the superior postero-lateral aspect of the pedicular shadow (target area). Sensory and motor testing was conducted to properly adjust the position of the needle. Once satisfactory placement of the needle was achieved, the numbing solution was slowly injected after negative aspiration for blood. 2.0 mL of the nerve block solution was injected without difficulty or complication. After waiting for at least 3 minutes, the ablation was performed. Once completed, the needle was removed intact. L3 Medial Branch Nerve RFA: The target area for the L3 medial branch is at the junction of the postero-lateral aspect of the superior articular process and the superior, posterior, and medial edge of the transverse process of L4. Under fluoroscopic guidance, a Radiofrequency needle was inserted until contact was made with os over the superior postero-lateral aspect of the pedicular shadow (target area). Sensory and motor testing was conducted to properly adjust the position of the needle. Once satisfactory placement of the needle was achieved, the numbing solution was slowly injected after negative aspiration for blood. 2.0 mL of the nerve block solution was injected without difficulty or complication. After waiting for at least 3 minutes, the ablation was performed. Once completed, the needle was removed intact. L4 Medial Branch Nerve RFA: The target area for the L4 medial branch is at the junction of the postero-lateral aspect of the superior articular process and the superior, posterior, and medial edge of the transverse process of L5. Under  fluoroscopic guidance, a Radiofrequency needle was inserted until contact was made with os over the superior postero-lateral aspect of the pedicular shadow (target area). Sensory and motor testing was conducted to properly adjust the position of the needle. Once satisfactory placement of the needle was achieved, the numbing solution was slowly injected after negative aspiration for blood. 2.0 mL of the nerve block solution was injected  without difficulty or complication. After waiting for at least 3 minutes, the ablation was performed. Once completed, the needle was removed intact. L5 Medial Branch Nerve RFA: The target area for the L5 medial branch is at the junction of the postero-lateral aspect of the superior articular process of S1 and the superior, posterior, and medial edge of the sacral ala. Under fluoroscopic guidance, a Radiofrequency needle was inserted until contact was made with os over the superior postero-lateral aspect of the pedicular shadow (target area). Sensory and motor testing was conducted to properly adjust the position of the needle. Once satisfactory placement of the needle was achieved, the numbing solution was slowly injected after negative aspiration for blood. 2.0 mL of the nerve block solution was injected without difficulty or complication. After waiting for at least 3 minutes, the ablation was performed. Once completed, the needle was removed intact. S1 Medial Branch Nerve RFA: The target area for the S1 medial branch is located inferior to the junction of the S1 superior articular process and the L5 inferior articular process, posterior, inferior, and lateral to the 6 o'clock position of the L5-S1 facet joint, just superior to the S1 posterior foramen. Under fluoroscopic guidance, the Radiofrequency needle was advanced until contact was made with os over the Target area. Sensory and motor testing was conducted to properly adjust the position of the needle. Once satisfactory  placement of the needle was achieved, the numbing solution was slowly injected after negative aspiration for blood. 2.0 mL of the nerve block solution was injected without difficulty or complication. After waiting for at least 3 minutes, the ablation was performed. Once completed, the needle was removed intact. Radiofrequency lesioning (ablation):  Radiofrequency Generator: NeuroTherm [id number] Sensory Stimulation Parameters: 50 Hz was used to locate & identify the nerve, making sure that the needle was positioned such that there was no sensory stimulation below 0.3 V or above 0.7 V. Motor Stimulation Parameters: 2 Hz was used to evaluate the motor component. Care was taken not to lesion any nerves that demonstrated motor stimulation of the lower extremities at an output of less than 2.5 times that of the sensory threshold, or a maximum of 2.0 V. Lesioning Technique Parameters: Standard Radiofrequency settings. (Not bipolar or pulsed.) Temperature Settings: 80 degrees C Lesioning time: 60 seconds Intra-operative Compliance: Compliant Materials & Medications: Needle(s) (Electrode/Cannula) Type: Teflon-coated, curved tip, Radiofrequency needle(s) Gauge: 22G Length: 10cm Numbing solution: 0.2% PF-Ropivacaine + Triamcinolone (40 mg/mL) diluted to a final concentration of 4 mg of Triamcinolone/mL of Ropivacaine The unused portion of the solution was discarded in the proper designated containers.  Once the entire procedure was completed, the treated area was cleaned, making sure to leave some of the prepping solution back to take advantage of its long term bactericidal properties.  Illustration of the posterior view of the lumbar spine and the posterior neural structures. Laminae of L2 through S1 are labeled. DPRL5, dorsal primary ramus of L5; DPRS1, dorsal primary ramus of S1; DPR3, dorsal primary ramus of L3; FJ, facet (zygapophyseal) joint L3-L4; I, inferior articular process of L4; LB1, lateral branch of  dorsal primary ramus of L1; IAB, inferior articular branches from L3 medial branch (supplies L4-L5 facet joint); IBP, intermediate branch plexus; MB3, medial branch of dorsal primary ramus of L3; NR3, third lumbar nerve root; S, superior articular process of L5; SAB, superior articular branches from L4 (supplies L4-5 facet joint also); TP3, transverse process of L3.  Vitals:   09/19/18 1206 09/19/18 1216 09/19/18 1226 09/19/18 1238  BP: (!) 157/90 130/90 130/90 (!) 129/91  Pulse:      Resp: 11 12 10 10   Temp:  (!) 97 F (36.1 C)  (!) 97.3 F (36.3 C)  TempSrc:      SpO2: 98% 100% 100% 100%  Weight:      Height:        Start Time: 1107 hrs. End Time: 1206 hrs.  Imaging Guidance (Spinal):          Type of Imaging Technique: Fluoroscopy Guidance (Spinal) Indication(s): Assistance in needle guidance and placement for procedures requiring needle placement in or near specific anatomical locations not easily accessible without such assistance. Exposure Time: Please see nurses notes. Contrast: None used. Fluoroscopic Guidance: I was personally present during the use of fluoroscopy. "Tunnel Vision Technique" used to obtain the best possible view of the target area. Parallax error corrected before commencing the procedure. "Direction-depth-direction" technique used to introduce the needle under continuous pulsed fluoroscopy. Once target was reached, antero-posterior, oblique, and lateral fluoroscopic projection used confirm needle placement in all planes. Images permanently stored in EMR. Interpretation: No contrast injected. I personally interpreted the imaging intraoperatively. Adequate needle placement confirmed in multiple planes. Permanent images saved into the patient's record.  Antibiotic Prophylaxis:   Anti-infectives (From admission, onward)   None     Indication(s): None identified  Post-operative Assessment:  Post-procedure Vital Signs:  Pulse/HCG Rate: 8361 Temp: (!) 97.3 F  (36.3 C) Resp: 10 BP: (!) 129/91 SpO2: 100 %  EBL: None  Complications: No immediate post-treatment complications observed by team, or reported by patient.  Note: The patient tolerated the entire procedure well. A repeat set of vitals were taken after the procedure and the patient was kept under observation following institutional policy, for this type of procedure. Post-procedural neurological assessment was performed, showing return to baseline, prior to discharge. The patient was provided with post-procedure discharge instructions, including a section on how to identify potential problems. Should any problems arise concerning this procedure, the patient was given instructions to immediately contact us, at any time, without hesitation. In any case, we plan to contact the patient by telephone for a follow-up status report regarding this interventional procedure.  Comments:  No additional relevant information.  Plan of Care  Interventional management options: Planned, scheduled, and/or pending:   NOTE: Eliquis anticoagulation (stop x3 days prior to procedure and restart 6 hours after procedures) NOTE: NO more RFA. Poor candidate due to unreliable, slow responses to test stimulation. None at this time. Consider cervical MRI.    Considering:   Diagnostic bilateral sacroiliac joint block Diagnostic bilateral intra-articular hip joint injection   Palliative PRN treatment(s):   Palliative midline cervical ESI for radicular shoulder pain. Palliative bilateral lumbar facet blocksfor low back pain.    Imaging Orders     DG C-Arm 1-60 Min-No Report  Procedure Orders     Radiofrequency,Lumbar  Medications ordered for procedure: Meds ordered this encounter  Medications  . HYDROcodone-acetaminophen (NORCO/VICODIN) 5-325 MG tablet    Sig: Take 1 tablet by mouth every 8 (eight) hours as needed for up to 7 days for severe pain. Must last 7 days.    Dispense:  21 tablet    Refill:  0     For acute post-operative pain. Not to be refilled. Must last 7 days.  Marland Kitchen HYDROcodone-acetaminophen (NORCO/VICODIN) 5-325 MG tablet    Sig: Take 1 tablet by mouth every 8 (eight) hours as needed for up to 7 days for severe pain. Must last  for 7 days.    Dispense:  21 tablet    Refill:  0    For acute post-operative pain. Not to be refilled. Must last 7 days.  Marland Kitchen lidocaine (XYLOCAINE) 2 % (with pres) injection 400 mg  . midazolam (VERSED) 5 MG/5ML injection 1-2 mg    Make sure Flumazenil is available in the pyxis when using this medication. If oversedation occurs, administer 0.2 mg IV over 15 sec. If after 45 sec no response, administer 0.2 mg again over 1 min; may repeat at 1 min intervals; not to exceed 4 doses (1 mg)  . fentaNYL (SUBLIMAZE) injection 25-50 mcg    Make sure Narcan is available in the pyxis when using this medication. In the event of respiratory depression (RR< 8/min): Titrate NARCAN (naloxone) in increments of 0.1 to 0.2 mg IV at 2-3 minute intervals, until desired degree of reversal.  . lactated ringers infusion 1,000 mL  . ropivacaine (PF) 2 mg/mL (0.2%) (NAROPIN) injection 9 mL  . triamcinolone acetonide (KENALOG-40) injection 40 mg  . Melatonin 10 MG CAPS    Sig: Take 20 mg by mouth at bedtime as needed.    Dispense:  60 capsule    Refill:  2  . polyethylene glycol (MIRALAX / GLYCOLAX) packet    Sig: Take 17 g by mouth daily as needed for moderate constipation.    Dispense:  14 each    Refill:  2    Do not place medication on "Automatic Refill". Fill one day early if pharmacy is closed on scheduled refill date.  Marland Kitchen rOPINIRole (REQUIP) 2 MG tablet    Sig: Take 2 tablets (4 mg total) by mouth at bedtime.    Dispense:  60 tablet    Refill:  2   Medications administered: We administered lidocaine, lactated ringers, ropivacaine (PF) 2 mg/mL (0.2%), and triamcinolone acetonide.  See the medical record for exact dosing, route, and time of administration.  Disposition:  Discharge home  Discharge Date & Time: 09/19/2018; 1239 hrs.   Physician-requested Follow-up: Return for Procedure (w/ sedation): (ML) CESI.  Future Appointments  Date Time Provider [place]  10/03/2018  9:45 AM Milinda Pointer, MD [facility] None  10/26/2018 10:00 AM Mikey College, NP [facility] None  10/31/2018 11:15 AM Vevelyn Francois, NP [facility] None  05/08/2019 10:45 AM [place] ADVISOR [facility] None   Primary Care Physician: Mikey College, NP Location: [facility] Note by: Gaspar Cola, MD Date: 09/19/2018; Time: 12:42 PM  Disclaimer:  Medicine is not an Chief Strategy Officer. The only guarantee in medicine is that nothing is guaranteed. It is important to note that the decision to proceed with this intervention was based on the information collected from the patient. The Data and conclusions were drawn from the patient's questionnaire, the interview, and the physical examination. Because the information was provided in large part by the patient, it cannot be guaranteed that it has not been purposely or unconsciously manipulated. Every effort has been made to obtain as much relevant data as possible for this evaluation. It is important to note that the conclusions that lead to this procedure are derived in large part from the available data. Always take into account that the treatment will also be dependent on availability of resources and existing treatment guidelines, considered by other Pain Management Practitioners as being common knowledge and practice, at the time of the intervention. For Medico-Legal purposes, it is also important to point out that variation in  procedural techniques and pharmacological choices are the acceptable norm. The indications, contraindications, technique, and results of the above procedure should only be interpreted and judged by a Board-Certified Interventional Pain Specialist with extensive  familiarity and expertise in the same exact procedure and technique.

## 2018-09-19 NOTE — Patient Instructions (Addendum)
___________________________________________________________________________________________  Post-Radiofrequency (RF) Discharge Instructions  You have just completed a Radiofrequency Neurotomy.  The following instructions will provide you with information and guidelines for self-care upon discharge.  If at any time you have questions or concerns please call your physician. DO NOT DRIVE YOURSELF!!  Instructions:  Apply ice: Fill a plastic sandwich bag with crushed ice. Cover it with a small towel and apply to injection site. Apply for 15 minutes then remove x 15 minutes. Repeat sequence on day of procedure, until you go to bed. The purpose is to minimize swelling and discomfort after procedure.  Apply heat: Apply heat to procedure site starting the day following the procedure. The purpose is to treat any soreness and discomfort from the procedure.  Food intake: No eating limitations, unless stipulated above.  Nevertheless, if you have had sedation, you may experience some nausea.  In this case, it may be wise to wait at least two hours prior to resuming regular diet.  Physical activities: Keep activities to a minimum for the first 8 hours after the procedure. For the first 24 hours after the procedure, do not drive a motor vehicle,  Operate heavy machinery, power tools, or handle any weapons.  Consider walking with the use of an assistive device or accompanied by an adult for the first 24 hours.  Do not drink alcoholic beverages including beer.  Do not make any important decisions or sign any legal documents. Go home and rest today.  Resume activities tomorrow, as tolerated.  Use caution in moving about as you may experience mild leg weakness.  Use caution in cooking, use of household electrical appliances and climbing steps.  Driving: If you have received any sedation, you are not allowed to drive for 24 hours after your procedure.  Blood thinner: Restart your blood thinner 6 hours after your  procedure. (Only for those taking blood thinners)  Insulin: As soon as you can eat, you may resume your normal dosing schedule. (Only for those taking insulin)  Medications: May resume pre-procedure medications.  Do not take any drugs, other than what has been prescribed to you.  Infection prevention: Keep procedure site clean and dry.  Post-procedure Pain Diary: Extremely important that this be done correctly and accurately. Recorded information will be used to determine the next step in treatment.  Pain evaluated is that of treated area only. Do not include pain from an untreated area.  Complete every hour, on the hour, for the initial 8 hours. Set an alarm to help you do this part accurately.  Do not go to sleep and have it completed later. It will not be accurate.  Follow-up appointment: Keep your follow-up appointment after the procedure. Usually 2 weeks for most procedures. (6 weeks in the case of radiofrequency.) Bring you pain diary.   Expect:  From numbing medicine (AKA: Local Anesthetics): Numbness or decrease in pain.  Onset: Full effect within 15 minutes of injected.  Duration: It will depend on the type of local anesthetic used. On the average, 1 to 8 hours.   From steroids: Decrease in swelling or inflammation. Once inflammation is improved, relief of the pain will follow.  Onset of benefits: Depends on the amount of swelling present. The more swelling, the longer it will take for the benefits to be seen. In some cases, up to 10 days.  Duration: Steroids will stay in the system x 2 weeks. Duration of benefits will depend on multiple posibilities including persistent irritating factors.  From procedure: Some   discomfort is to be expected once the numbing medicine wears off. This should be minimal if ice and heat are applied as instructed.  Call if:  You experience numbness and weakness that gets worse with time, as opposed to wearing off.  He experience any unusual  bleeding, difficulty breathing, or loss of the ability to control your bowel and bladder. (This applies to Spinal procedures only)  You experience any redness, swelling, heat, red streaks, elevated temperature, fever, or any other signs of a possible infection.  Emergency Numbers:  Durning business hours (Monday - Thursday, 8:00 AM - 4:00 PM) (Friday, 9:00 AM - 12:00 Noon): [phone]  After hours: [phone] ____________________________________________________________________________________________   ____________________________________________________________________________________________  Preparing for Procedure with Sedation  Instructions: . Oral Intake: Do not eat or drink anything for at least 8 hours prior to your procedure. . Transportation: Public transportation is not allowed. Bring an adult driver. The driver must be physically present in our waiting room before any procedure can be started. . Physical Assistance: Bring an adult physically capable of assisting you, in the event you need help. This adult should keep you company at home for at least 6 hours after the procedure. . Blood Pressure Medicine: Take your blood pressure medicine with a sip of water the morning of the procedure. . Blood thinners: Notify our staff if you are taking any blood thinners. Depending on which one you take, there will be specific instructions on how and when to stop it. . Diabetics on insulin: Notify the staff so that you can be scheduled 1st case in the morning. If your diabetes requires high dose insulin, take only  of your normal insulin dose the morning of the procedure and notify the staff that you have done so. . Preventing infections: Shower with an antibacterial soap the morning of your procedure. . Build-up your immune system: Take 1000 mg of Vitamin C with every meal (3 times a day) the day prior to your procedure. . Antibiotics: Inform the staff if you have a condition or  reason that requires you to take antibiotics before dental procedures. . Pregnancy: If you are pregnant, call and cancel the procedure. . Sickness: If you have a cold, fever, or any active infections, call and cancel the procedure. . Arrival: You must be in the facility at least 30 minutes prior to your scheduled procedure. . Children: Do not bring children with you. . Dress appropriately: Bring dark clothing that you would not mind if they get stained. . Valuables: Do not bring any jewelry or valuables.  Procedure appointments are reserved for interventional treatments only. . No Prescription Refills. . No medication changes will be discussed during procedure appointments. . No disability issues will be discussed.  Reasons to call and reschedule or cancel your procedure: (Following these recommendations will minimize the risk of a serious complication.) . Surgeries: Avoid having procedures within 2 weeks of any surgery. (Avoid for 2 weeks before or after any surgery). . Flu Shots: Avoid having procedures within 2 weeks of a flu shots or . (Avoid for 2 weeks before or after immunizations). . Barium: Avoid having a procedure within 7-10 days after having had a radiological study involving the use of radiological contrast. (Myelograms, Barium swallow or enema study). . Heart attacks: Avoid any elective procedures or surgeries for the initial 6 months after a "Myocardial Infarction" (Heart Attack). . Blood thinners: It is imperative that you stop these medications before procedures. Let us know if you if you   take any blood thinner.  . Infection: Avoid procedures during or within two weeks of an infection (including chest colds or gastrointestinal problems). Symptoms associated with infections include: Localized redness, fever, chills, night sweats or profuse sweating, burning sensation when voiding, cough, congestion, stuffiness, runny nose, sore throat, diarrhea, nausea, vomiting, cold or Flu  symptoms, recent or current infections. It is specially important if the infection is over the area that we intend to treat. . Heart and lung problems: Symptoms that may suggest an active cardiopulmonary problem include: cough, chest pain, breathing difficulties or shortness of breath, dizziness, ankle swelling, uncontrolled high or unusually low blood pressure, and/or palpitations. If you are experiencing any of these symptoms, cancel your procedure and contact your primary care physician for an evaluation.  Remember:  Regular Business hours are:  Monday to Thursday 8:00 AM to 4:00 PM  Provider's Schedule: Sevan Mcbroom, MD:  Procedure days: Tuesday and Thursday 7:30 AM to 4:00 PM  Bilal Lateef, MD:  Procedure days: Monday and Wednesday 7:30 AM to 4:00 PM ____________________________________________________________________________________________    

## 2018-09-19 NOTE — Progress Notes (Signed)
Safety precautions to be maintained throughout the outpatient stay will include: orient to surroundings, keep bed in low position, maintain call bell within reach at all times, provide assistance with transfer out of bed and ambulation.  

## 2018-09-20 ENCOUNTER — Telehealth: Payer: Self-pay | Admitting: *Deleted

## 2018-09-20 NOTE — Telephone Encounter (Signed)
Denies complications or any pain this morning. "doing good". Instructed to call for any post procedure problems.

## 2018-09-28 ENCOUNTER — Other Ambulatory Visit: Payer: Self-pay

## 2018-09-28 DIAGNOSIS — G894 Chronic pain syndrome: Secondary | ICD-10-CM

## 2018-09-28 DIAGNOSIS — I10 Essential (primary) hypertension: Secondary | ICD-10-CM

## 2018-09-28 DIAGNOSIS — I5032 Chronic diastolic (congestive) heart failure: Secondary | ICD-10-CM

## 2018-09-28 MED ORDER — FUROSEMIDE 20 MG PO TABS: ORAL_TABLET | ORAL | 0 refills | Status: DC

## 2018-09-28 MED ORDER — DULOXETINE HCL 20 MG PO CPEP: 20.0000 mg | ORAL_CAPSULE | Freq: Every day | ORAL | 1 refills | Status: DC

## 2018-09-28 MED ORDER — LISINOPRIL 10 MG PO TABS: 10.0000 mg | ORAL_TABLET | Freq: Every day | ORAL | 1 refills | Status: DC

## 2018-10-03 ENCOUNTER — Encounter: Payer: Self-pay | Admitting: Pain Medicine

## 2018-10-03 ENCOUNTER — Other Ambulatory Visit (HOSPITAL_COMMUNITY): Payer: Self-pay | Admitting: Nephrology

## 2018-10-03 ENCOUNTER — Ambulatory Visit: Payer: Medicare Other | Admitting: Pain Medicine

## 2018-10-03 ENCOUNTER — Other Ambulatory Visit: Payer: Self-pay | Admitting: Nephrology

## 2018-10-03 VITALS — Ht 67.0 in | Wt 152.0 lb

## 2018-10-03 DIAGNOSIS — R319 Hematuria, unspecified: Secondary | ICD-10-CM

## 2018-10-03 DIAGNOSIS — M542 Cervicalgia: Secondary | ICD-10-CM | POA: Insufficient documentation

## 2018-10-03 NOTE — Progress Notes (Signed)
NO SHOW

## 2018-10-03 NOTE — Patient Instructions (Signed)

## 2018-10-06 ENCOUNTER — Other Ambulatory Visit: Payer: Self-pay | Admitting: Family Medicine

## 2018-10-06 DIAGNOSIS — J302 Other seasonal allergic rhinitis: Secondary | ICD-10-CM

## 2018-10-10 ENCOUNTER — Ambulatory Visit: Payer: Medicare Other | Admitting: Pain Medicine

## 2018-10-24 ENCOUNTER — Ambulatory Visit (HOSPITAL_COMMUNITY)
Admission: RE | Admit: 2018-10-24 | Discharge: 2018-10-24 | Disposition: A | Discharge status: Home / Self Care | Payer: Medicare Other | Source: Ambulatory Visit | Attending: Nephrology | Admitting: Nephrology

## 2018-10-24 DIAGNOSIS — R319 Hematuria, unspecified: Secondary | ICD-10-CM

## 2018-10-26 ENCOUNTER — Other Ambulatory Visit: Payer: Self-pay

## 2018-10-26 ENCOUNTER — Encounter: Payer: Self-pay | Admitting: Nurse Practitioner

## 2018-10-26 ENCOUNTER — Ambulatory Visit (INDEPENDENT_AMBULATORY_CARE_PROVIDER_SITE_OTHER): Payer: Medicare Other | Admitting: Nurse Practitioner

## 2018-10-26 VITALS — BP 134/71 | HR 63 | Temp 98.5°F | Resp 16 | Ht 67.0 in | Wt 159.4 lb

## 2018-10-26 DIAGNOSIS — I10 Essential (primary) hypertension: Secondary | ICD-10-CM | POA: Diagnosis not present

## 2018-10-26 DIAGNOSIS — N183 Chronic kidney disease, stage 3 unspecified: Secondary | ICD-10-CM

## 2018-10-26 DIAGNOSIS — I5032 Chronic diastolic (congestive) heart failure: Secondary | ICD-10-CM

## 2018-10-26 DIAGNOSIS — G2581 Restless legs syndrome: Secondary | ICD-10-CM

## 2018-10-26 DIAGNOSIS — G4700 Insomnia, unspecified: Secondary | ICD-10-CM

## 2018-10-26 MED ORDER — ROPINIROLE HCL 2 MG PO TABS: 4.0000 mg | ORAL_TABLET | Freq: Every day | ORAL | 2 refills | Status: DC

## 2018-10-26 MED ORDER — MELATONIN 10 MG PO CAPS: 20.0000 mg | ORAL_CAPSULE | Freq: Every evening | ORAL | 2 refills | Status: DC | PRN

## 2018-10-26 MED ORDER — FUROSEMIDE 20 MG PO TABS: ORAL_TABLET | ORAL | 1 refills | Status: DC

## 2018-10-26 NOTE — Progress Notes (Signed)
Subjective:    [name], female    DOB: Nov 18, 1929, 85 y.o.   MRN: [id number]  Kristin [name] is a 85 y.o. female presenting on 10/26/2018 for Hypertension (follow up) and Depression  HPI Hypertension - She is not checking BP at home or outside of clinic.    - Current medications: lisinopril 10 mg daily, furosemide 10 mg daily, tolerating well without side effects - She is not currently symptomatic.  Does have standard dependent leg edema (mild) that resolves at night. - Pt denies headache, lightheadedness, dizziness, changes in vision, chest tightness/pressure, palpitations, leg swelling, sudden loss of speech or loss of consciousness. - She  reports no regular exercise routine. - Her diet is moderate in salt, moderate in fat, and moderate in carbohydrates.   CKD stage IIIb Patient is seen by Dr. Juleen China for CKD in setting of CHF - ok in Feb to continue furosemide.  Depression Patient feels good with current moods.  Patient notes not significant improvement in pain control on this med.  Continues oxycodone.  Gets good relief with Miralax and occasional dulcolax (uses about twice weekly).  Patient always has relief with this.  Patient has to use dulcolax to have a BM.    Depression screen [facility] 2/9 10/26/2018 10/26/2018 09/19/2018 07/13/2018 05/29/2018  Decreased Interest 2 0 0 0 0  Down, Depressed, Hopeless 0 0 0 0 0  PHQ - 2 Score 2 0 0 0 0  Altered sleeping 1 - - - -  Tired, decreased energy 3 - - - -  Change in appetite 0 - - - -  Feeling bad or failure about yourself  0 - - - -  Trouble concentrating 0 - - - -  Moving slowly or fidgety/restless 1 - - - -  Suicidal thoughts 0 - - - -  PHQ-9 Score 7 - - - -  Difficult doing work/chores Somewhat difficult - - - -  Some recent data might be hidden    GAD 7 : Generalized Anxiety Score 10/26/2018  Nervous, Anxious, on Edge 0  Control/stop worrying 0  Worry too much - different things 0  Trouble relaxing 0  Restless 0    Easily annoyed or irritable 0  Afraid - awful might happen 0  Total GAD 7 Score 0  Anxiety Difficulty Not difficult at all     Social History   Tobacco Use  . Smoking status: Former Smoker    Packs/day: 1.25    Years: 40.00    Pack years: 50.00    Types: Cigarettes    Last attempt to quit: 12/16/1998    Years since quitting: 19.8  . Smokeless tobacco: Never Used  Substance Use Topics  . Alcohol use: No    Alcohol/week: 0.0 standard drinks  . Drug use: No    Review of Systems Per HPI unless specifically indicated above     Objective:    BP 134/71   Pulse 63   Temp 98.5 F (36.9 C) (Oral)   Resp 16   Ht 5\' 7"  (1.702 m)   Wt 159 lb 6.4 oz (72.3 kg)   SpO2 100%   BMI 24.97 kg/m   Wt Readings from Last 3 Encounters:  10/26/18 159 lb 6.4 oz (72.3 kg)  10/03/18 152 lb (68.9 kg)  09/19/18 147 lb (66.7 kg)    Physical Exam Vitals signs reviewed.  Constitutional:      General: She is awake. She is not in acute distress.  Appearance: She is well-developed.  HENT:     Head: Normocephalic and atraumatic.  Neck:     Musculoskeletal: Normal range of motion and neck supple.     Vascular: No carotid bruit.  Cardiovascular:     Rate and Rhythm: Normal rate and regular rhythm.     Pulses:          Radial pulses are 2+ on the right side and 2+ on the left side.       Posterior tibial pulses are 1+ on the right side and 1+ on the left side.     Heart sounds: Normal heart sounds, S1 normal and S2 normal.  Pulmonary:     Effort: Pulmonary effort is normal. No respiratory distress.     Breath sounds: Normal breath sounds and air entry.  Musculoskeletal:     Right lower leg: Edema (trace) present.     Left lower leg: 1+ Edema (non-pitting) present.  Skin:    General: Skin is warm and dry.  Neurological:     Mental Status: She is alert and oriented to person, place, and time.  Psychiatric:        Attention and Perception: Attention normal.        Mood and Affect: Mood  and affect normal.        Behavior: Behavior normal. Behavior is cooperative.    Results for orders placed or performed in visit on 07/25/18  COMPLETE METABOLIC PANEL WITH GFR  Result Value Ref Range   Glucose, Bld 75 65 - 99 mg/dL   BUN 29 (H) 7 - 25 mg/dL   Creat 1.49 (H) 0.60 - 0.88 mg/dL   GFR, Est Non African American 31 (L) > OR = 60 mL/min/1.42m2   GFR, Est African American 36 (L) > OR = 60 mL/min/1.45m2   BUN/Creatinine Ratio 19 6 - 22 (calc)   Sodium 138 135 - 146 mmol/L   Potassium 4.1 3.5 - 5.3 mmol/L   Chloride 103 98 - 110 mmol/L   CO2 23 20 - 32 mmol/L   Calcium 10.2 8.6 - 10.4 mg/dL   Total Protein 7.1 6.1 - 8.1 g/dL   Albumin 4.4 3.6 - 5.1 g/dL   Globulin 2.7 1.9 - 3.7 g/dL (calc)   AG Ratio 1.6 1.0 - 2.5 (calc)   Total Bilirubin 0.6 0.2 - 1.2 mg/dL   Alkaline phosphatase (APISO) 81 33 - 130 U/L   AST 21 10 - 35 U/L   ALT 11 6 - 29 U/L      Assessment & Plan:   Problem List Items Addressed This Visit      Cardiovascular and Mediastinum   Benign essential HTN - Primary Controlled hypertension.  BP goal < 130/80.  Pt is maintaining prior lifestyle modifications.  Taking medications tolerating well without side effects. CKD, CHF complications.  Plan: 1. Continue taking medications without changes. 2. Obtain labs today  3. Encouraged heart healthy diet and increasing exercise to 30 minutes most days of the week. 4. Check BP 1-2 x per week at home, keep log, and bring to clinic at next appointment. 5. Follow up 6 months.     Relevant Medications   furosemide (LASIX) 20 MG tablet   Other Relevant Orders   CBC with Differential/Platelet   Comprehensive metabolic panel   Chronic diastolic CHF (congestive heart failure) ([place])' Stable today on exam.  Medications tolerated without side effects.  Continue at current doses.  Refills provided.  Check labs today. Continue daily weights.  Followup 6 months and sooner if needed.    Relevant Medications   furosemide  (LASIX) 20 MG tablet   Other Relevant Orders   CBC with Differential/Platelet   Comprehensive metabolic panel     Genitourinary   Chronic kidney disease (CKD), stage III (moderate) (HCC) Stable today on exam.  Medications tolerated without side effects.  Continue management of meds with Dr. Juleen China.  Last note reviewed and stable..  Check labs today. Followup 6 months.    Relevant Orders   Comprehensive metabolic panel     Other   Restless leg syndrome (Chronic) Stable - continue current meds.  Refills provided. Follow-up 6 months.    Insomnia (Chronic) Stable.  Continue current meds.  Refills provided.  Follow-up 6 months.  Continue management of RLS to help with insomnia. Encouraged good sleep hygiene.      Meds ordered this encounter  Medications  . DISCONTD: rOPINIRole (REQUIP) 2 MG tablet    Sig: Take 2 tablets (4 mg total) by mouth at bedtime.    Dispense:  60 tablet    Refill:  2    Order Specific Question:   Supervising Provider    Answer:   Olin Hauser [[id number]]  . DISCONTD: Melatonin 10 MG CAPS    Sig: Take 20 mg by mouth at bedtime as needed.    Dispense:  60 capsule    Refill:  2    Order Specific Question:   Supervising Provider    Answer:   Olin Hauser [[id number]]  . furosemide (LASIX) 20 MG tablet    Sig: TAKE (1/2) TABLET BY MOUTH ONCE DAILY.    Dispense:  45 tablet    Refill:  1    Order Specific Question:   Supervising Provider    Answer:   Olin Hauser [[id number]]   Follow up plan: Return in about 6 months (around 04/28/2019) for hypertension, depression.  [name], DNP, AGPCNP-BC Adult Gerontology Primary Care Nurse Practitioner [facility] 10/26/2018, 10:23 AM

## 2018-10-26 NOTE — Patient Instructions (Addendum)
Kristin [name],   Thank you for coming in to clinic today.  1. Continue all of your current medications without changes.  You are doing well. - Continue your followup with Dr. Juleen China for your kidneys.  - Needs labs  - take your order with you to Dr. Juleen China  Please schedule a follow-up appointment with Kristin [name], AGNP. Return in about 6 months (around 04/28/2019) for hypertension, depression.  If you have any other questions or concerns, please feel free to call the clinic or send a message through [place]. You may also schedule an earlier appointment if necessary.  You will receive a survey after today's visit either digitally by e-mail or paper by [employer]. Your experiences and feedback matter to [name].  Please respond so we know how we are doing as we provide care for you.  Kristin Smiles, DNP, AGNP-BC Adult Gerontology Nurse Practitioner [place]

## 2018-10-31 ENCOUNTER — Ambulatory Visit: Payer: Medicare Other | Attending: Nurse Practitioner | Admitting: Nurse Practitioner

## 2018-10-31 ENCOUNTER — Other Ambulatory Visit: Payer: Self-pay

## 2018-10-31 ENCOUNTER — Encounter: Payer: Self-pay | Admitting: Nurse Practitioner

## 2018-10-31 VITALS — BP 146/75 | HR 70 | Temp 98.1°F | Resp 18 | Ht 66.0 in | Wt 156.0 lb

## 2018-10-31 DIAGNOSIS — R200 Anesthesia of skin: Secondary | ICD-10-CM | POA: Insufficient documentation

## 2018-10-31 DIAGNOSIS — M47816 Spondylosis without myelopathy or radiculopathy, lumbar region: Secondary | ICD-10-CM | POA: Insufficient documentation

## 2018-10-31 DIAGNOSIS — M19011 Primary osteoarthritis, right shoulder: Secondary | ICD-10-CM

## 2018-10-31 DIAGNOSIS — G894 Chronic pain syndrome: Secondary | ICD-10-CM | POA: Diagnosis not present

## 2018-10-31 DIAGNOSIS — G2581 Restless legs syndrome: Secondary | ICD-10-CM | POA: Insufficient documentation

## 2018-10-31 DIAGNOSIS — G4700 Insomnia, unspecified: Secondary | ICD-10-CM | POA: Diagnosis present

## 2018-10-31 DIAGNOSIS — T402X5A Adverse effect of other opioids, initial encounter: Secondary | ICD-10-CM | POA: Insufficient documentation

## 2018-10-31 DIAGNOSIS — K5903 Drug induced constipation: Secondary | ICD-10-CM | POA: Insufficient documentation

## 2018-10-31 DIAGNOSIS — M5030 Other cervical disc degeneration, unspecified cervical region: Secondary | ICD-10-CM

## 2018-10-31 DIAGNOSIS — R202 Paresthesia of skin: Secondary | ICD-10-CM | POA: Diagnosis present

## 2018-10-31 DIAGNOSIS — M7918 Myalgia, other site: Secondary | ICD-10-CM | POA: Diagnosis present

## 2018-10-31 DIAGNOSIS — M19012 Primary osteoarthritis, left shoulder: Secondary | ICD-10-CM | POA: Insufficient documentation

## 2018-10-31 MED ORDER — MELATONIN 10 MG PO CAPS: 20.0000 mg | ORAL_CAPSULE | Freq: Every evening | ORAL | 2 refills | Status: DC | PRN

## 2018-10-31 MED ORDER — ROPINIROLE HCL 2 MG PO TABS: 4.0000 mg | ORAL_TABLET | Freq: Every day | ORAL | 2 refills | Status: DC

## 2018-10-31 MED ORDER — OXYCODONE HCL 20 MG PO TABS: 20.0000 mg | ORAL_TABLET | Freq: Three times a day (TID) | ORAL | 0 refills | Status: DC | PRN

## 2018-10-31 MED ORDER — POLYETHYLENE GLYCOL 3350 17 G PO PACK: 17.0000 g | PACK | Freq: Every day | ORAL | 2 refills | Status: DC | PRN

## 2018-10-31 NOTE — Progress Notes (Signed)
Nursing Pain Medication Assessment:  Safety precautions to be maintained throughout the outpatient stay will include: orient to surroundings, keep bed in low position, maintain call bell within reach at all times, provide assistance with transfer out of bed and ambulation.  Medication Inspection Compliance: Pill count conducted under aseptic conditions, in front of the patient. Neither the pills nor the bottle was removed from the patient's sight at any time. Once count was completed pills were immediately returned to the patient in their original bottle.  Medication: Oxycodone IR Pill/Patch Count: 4 of 90 pills remain Pill/Patch Appearance: Markings consistent with prescribed medication Bottle Appearance: Standard pharmacy container. Clearly labeled. Filled Date: 02 / 13 / 2020 Last Medication intake:  Today

## 2018-10-31 NOTE — Progress Notes (Signed)
Patient's Name: Kristin [name]  MRN: [id number]  Referring Provider: Mikey College, *  DOB: 1929/09/24  PCP: Mikey College, NP  DOS: 10/31/2018  Note by: Dionisio David, NP  Service setting: Ambulatory outpatient  Specialty: Interventional Pain Management  Location: [facility] (AMB) Pain Management Facility    Patient type: Established   HPI  Reason for Visit: Kristin [name] is a 85 y.o. year old, female patient, who comes today with a chief complaint of Medication Refill and Back Pain Last Appointment: Her last appointment at our practice was on 09/20/2018. I last saw her on 08/28/2018.  Pain Assessment: Today, Kristin [name] describes the severity of the Chronic pain as a 6 /10. She indicates the location/referral of the pain to be Back Lower, Left/Denies . Onset was: More than a month ago. The quality of pain is described as Aching, Nagging. Temporal description, or timing of pain is: Constant. Possible modifying factors: Oxycodone, sitting and laying down. Kristin [name]  height is '5\' 6"'  (1.676 m) and weight is 156 lb (70.8 kg). Her temperature is 98.1 F (36.7 C). Her blood pressure is 146/75 (abnormal) and her pulse is 70. Her respiration is 18 and oxygen saturation is 95%. She admits that the injections do not seem to last very long.  She only got 2 weeks of relief out of the radiofrequency.  She does continue to do what she can.  She feels like she is very fortunate to be 89.  Controlled Substance Pharmacotherapy Assessment REMS (Risk Evaluation and Mitigation Strategy)  Analgesic:Oxycodone IR 20 mg every 8 hours (60 mg/day) MME/day:90 mg/day  Janne Napoleon, RN  10/31/2018 11:51 AM  Sign when Signing Visit Nursing Pain Medication Assessment:  Safety precautions to be maintained throughout the outpatient stay will include: orient to surroundings, keep bed in low position, maintain call bell within reach at all times, provide assistance with transfer out of bed and ambulation.   Medication Inspection Compliance: Pill count conducted under aseptic conditions, in front of the patient. Neither the pills nor the bottle was removed from the patient's sight at any time. Once count was completed pills were immediately returned to the patient in their original bottle.  Medication: Oxycodone IR Pill/Patch Count: 4 of 90 pills remain Pill/Patch Appearance: Markings consistent with prescribed medication Bottle Appearance: Standard pharmacy container. Clearly labeled. Filled Date: 02 / 13 / 2020 Last Medication intake:  Today   Pharmacokinetics: Liberation and absorption (onset of action): WNL Distribution (time to peak effect): WNL Metabolism and excretion (duration of action): WNL         Pharmacodynamics: Desired effects: Analgesia: Kristin [name] reports >50% benefit. Functional ability: Patient reports that medication allows her to accomplish basic ADLs Clinically meaningful improvement in function (CMIF): Sustained CMIF goals met Perceived effectiveness: Described as relatively effective, allowing for increase in activities of daily living (ADL) Undesirable effects: Side-effects or Adverse reactions: None reported Monitoring:  PMP: Online review of the past 35-monthperiod conducted. Compliant with practice rules and regulations Last UDS on record: Summary  Date Value Ref Range Status  02/01/2018 FINAL  Final    Comment:    ==================================================================== TOXASSURE SELECT 13 (MW) ==================================================================== Test                             Result       Flag       Units Drug Present and Declared for Prescription Verification   Oxycodone  401          EXPECTED   ng/mg creat   Oxymorphone                    438          EXPECTED   ng/mg creat   Noroxycodone                   1122         EXPECTED   ng/mg creat   Noroxymorphone                 309          EXPECTED    ng/mg creat    Sources of oxycodone are scheduled prescription medications.    Oxymorphone, noroxycodone, and noroxymorphone are expected    metabolites of oxycodone. Oxymorphone is also available as a    scheduled prescription medication. ==================================================================== Test                      Result    Flag   Units      Ref Range   Creatinine              79               mg/dL      >=20 ==================================================================== Declared Medications:  The flagging and interpretation on this report are based on the  following declared medications.  Unexpected results may arise from  inaccuracies in the declared medications.  **Note: The testing scope of this panel includes these medications:  Oxycodone  **Note: The testing scope of this panel does not include following  reported medications:  Albuterol (Proventil)  Apixaban  Bisacodyl  Furosemide (Lasix)  Lisinopril  Melatonin  Polyethylene Glycol (MiraLAX)  Ropinirole  Vitamin D ==================================================================== For clinical consultation, please call [phone]. ====================================================================    UDS interpretation: Compliant          Medication Assessment Form: Reviewed. Patient indicates being compliant with therapy Treatment compliance: Compliant Risk Assessment Profile: Aberrant behavior: See initial evaluations. None observed or detected today Comorbid factors increasing risk of overdose: See initial evaluation. No additional risks detected today Opioid risk tool (ORT):  Opioid Risk  08/01/2018  Alcohol 0  Illegal Drugs 0  Rx Drugs 0  Alcohol 0  Illegal Drugs 0  Rx Drugs 0  Age between 16-45 years  0  History of Preadolescent Sexual Abuse 0  Psychological Disease 0  Depression 0  Opioid Risk Tool Scoring 0  Opioid Risk Interpretation Low Risk    ORT Scoring  interpretation table:  Score <3 = Low Risk for SUD  Score between 4-7 = Moderate Risk for SUD  Score >8 = High Risk for Opioid Abuse   Risk of substance use disorder (SUD): Low  Risk Mitigation Strategies:  Patient Counseling: Covered Patient-Prescriber Agreement (PPA): Present and active  Notification to other healthcare providers: Done  Pharmacologic Plan: No change in therapy, at this time.            Evaluation of last interventional procedure  09/19/2018 Procedure: Right lumbar facet radiofrequency ablation Pre-procedure pain score:  5/10 Post-procedure pain score: 0/10         Influential Factors: Intra-procedural challenges: None observed.         Reported side-effects: None.        Post-procedural adverse reactions or complications: None reported         Sedation:  Please see nurses note for DOS. When no sedatives are used, the analgesic levels obtained are directly associated to the effectiveness of the local anesthetics. However, when sedation is provided, the level of analgesia obtained during the initial 1 hour following the intervention, is believed to be the result of a combination of factors. These factors may include, but are not limited to: 1. The effectiveness of the local anesthetics used. 2. The effects of the analgesic(s) and/or anxiolytic(s) used. 3. The degree of discomfort experienced by the patient at the time of the procedure. 4. The patients ability and reliability in recalling and recording the events. 5. The presence and influence of possible secondary gains and/or psychosocial factors. Reported result: Relief experienced during the 1st hour after the procedure: 100 % (Ultra-Short Term Relief)            Interpretative annotation: Clinically appropriate result. Analgesia during this period is likely to be Local Anesthetic and/or IV Sedative (Analgesic/Anxiolytic) related.          Effects of local anesthetic: The analgesic effects attained during this period  are directly associated to the localized infiltration of local anesthetics and therefore cary significant diagnostic value as to the etiological location, or anatomical origin, of the pain. Expected duration of relief is directly dependent on the pharmacodynamics of the local anesthetic used. Long-acting (4-6 hours) anesthetics used.  Reported result: Relief during the next 4 to 6 hour after the procedure: 100 %(2 weeks only ) (Short-Term Relief)            Interpretative annotation: Clinically appropriate result. Analgesia during this period is likely to be Local Anesthetic-related.          Long-term benefit: Defined as the period of time past the expected duration of local anesthetics (1 hour for short-acting and 4-6 hours for long-acting). With the possible exception of prolonged sympathetic blockade from the local anesthetics, benefits during this period are typically attributed to, or associated with, other factors such as analgesic sensory neuropraxia, antiinflammatory effects, or beneficial biochemical changes provided by agents other than the local anesthetics.  Reported result: Extended relief following procedure: 0 % (Long-Term Relief)            Interpretative annotation:    Short-term benefit. No permanent benefit expected.            ROS  Constitutional: Denies any fever or chills Gastrointestinal: No reported hemesis, hematochezia, vomiting, or acute GI distress Musculoskeletal: Denies any acute onset joint swelling, redness, loss of ROM, or weakness Neurological: No reported episodes of acute onset apraxia, aphasia, dysarthria, agnosia, amnesia, paralysis, loss of coordination, or loss of consciousness  Medication Review  DULoxetine, Melatonin, Oxycodone HCl, Oxygen-Helium, Vitamin D, albuterol, apixaban, bisacodyl, fluconazole, furosemide, levocetirizine, lisinopril, polyethylene glycol, and rOPINIRole  History Review  Allergy: Kristin [name] is allergic to flexeril [cyclobenzaprine  hcl] and vancomycin. Drug: Kristin [name]  reports no history of drug use. Alcohol:  reports no history of alcohol use. Tobacco:  reports that she quit smoking about 19 years ago. Her smoking use included cigarettes. She has a 50.00 pack-year smoking history. She has never used smokeless tobacco. Social: Kristin [name]  reports that she quit smoking about 19 years ago. Her smoking use included cigarettes. She has a 50.00 pack-year smoking history. She has never used smokeless tobacco. She reports that she does not drink alcohol or use drugs. Medical:  has a past medical history of Acute postoperative pain (02/09/2018), Arrhythmia, sinus node (04/03/2014), Arthritis, Arthritis, Atrial fibrillation (  [place]), Back pain, Bradycardia, Breast cancer ([place]) (2004), Cancer ([place]) (2004), CHF (congestive heart failure) ([place]), Chronic back pain, CKD (chronic kidney disease), COPD (chronic obstructive pulmonary disease) ([place]), Cystocele, Decubitus ulcers, Dehydration, Gout, Hematuria, Hepatitis C, Hyperlipidemia, Hypertension, Hypomagnesemia (06/25/2015), OAB (overactive bladder), Overactive bladder, Restless leg, Shoulder pain, bilateral, Urinary frequency, UTI (lower urinary tract infection), and Vaginal atrophy. Surgical: Kristin [name]  has a past surgical history that includes Abdominal hysterectomy; Back surgery; Rotator cuff repair; Breast surgery; Cataract extraction w/PHACO (10/19/2011); Cataract extraction w/PHACO (11/02/2011); Carpal tunnel release; Joint replacement; Colon surgery; Pacemaker insertion (N/A, 12/24/2015); Cholecystectomy; Breast lumpectomy (Right, 2004); and Insert / replace / remove pacemaker. Family: family history includes Breast cancer in her paternal aunt; Breast cancer (age of onset: 8) in her sister; Cancer in her sister; Heart disease in her father and mother; Kidney disease in her brother; Stroke in her mother. 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  Lab Review  Kidney Function Lab Results  Component Value Date   BUN 29 (H) 07/25/2018   CREATININE 1.49 (H) 07/25/2018   BCR 19 07/25/2018   GFRAA 36 (L) 07/25/2018   GFRNONAA 31 (L) 07/25/2018  Liver Function Lab Results  Component Value Date   AST 21 07/25/2018   ALT 11 07/25/2018   ALBUMIN 3.6 08/22/2017  Note: Above Lab results reviewed.  Imaging Review  US RENAL CLINICAL DATA:  Hematuria.  EXAM: RENAL / URINARY TRACT ULTRASOUND COMPLETE  COMPARISON:  Abdominal MRI 07/27/2010  FINDINGS: Right Kidney:  Renal measurements: 8.8 x 3.7 x 4.7 cm = volume: 81 mL . Echogenicity within normal limits. No mass or hydronephrosis visualized.  Left Kidney:  Renal measurements: 8.4 x 4.1 x 3.9 cm = volume: 70 mL. Echogenicity within normal limits. No solid mass or hydronephrosis visualized. 1.7 cm cyst.  Bladder:  Appears normal for degree of bladder distention.  IMPRESSION: 1.7 cm left renal cyst, otherwise unremarkable appearance of the kidneys. No hydronephrosis.  Electronically Signed   By: Logan Bores M.D.   On: 10/25/2018 08:38 Note: Reviewed        Physical Exam  General appearance: Well nourished, well developed,  and well hydrated. In no apparent acute distress Mental status: Alert, oriented x 3 (person, place, & time)       Respiratory: No evidence of acute respiratory distress Eyes: PERLA Vitals: BP (!) 146/75   Pulse 70   Temp 98.1 F (36.7 C)   Resp 18   Ht '5\' 6"'  (1.676 m)   Wt 156 lb (70.8 kg)   SpO2 95%   BMI 25.18 kg/m  BMI: Estimated body mass index is 25.18 kg/m as calculated from the following:   Height as of this encounter: '5\' 6"'  (1.676 m).   Weight as of this encounter: 156 lb (70.8 kg). Ideal: Ideal body weight: 59.3 kg (130 lb 11.7 oz) Adjusted ideal body weight:  63.9 kg (140 lb 13.4 oz) Upper Extremity (UE) Exam    Side: Right upper extremity  Side: Left upper extremity  Skin & Extremity Inspection: Evidence of prior arthroplastic surgery  Skin & Extremity Inspection: Evidence of prior arthroplastic surgery  Functional ROM: Decreased ROM          Functional ROM: Decreased ROM          Muscle Tone/Strength: Functionally intact. No obvious neuro-muscular anomalies detected.  Muscle Tone/Strength: Functionally intact. No obvious neuro-muscular anomalies detected.  Sensory (Neurological): Unimpaired          Sensory (Neurological): Unimpaired          Palpation: No palpable anomalies              Palpation: No palpable anomalies                   Lumbar Spine Area Exam  Skin & Axial Inspection: No masses, redness, or swelling Alignment: Symmetrical Functional ROM: Unrestricted ROM       Stability: No instability detected Muscle Tone/Strength: Functionally intact. No obvious neuro-muscular anomalies detected. Sensory (Neurological): Unimpaired Palpation: Tender       Provocative Tests: Hyperextension/rotation test: Positive bilaterally for facet joint pain. Lumbar quadrant test (Kemp's test): deferred today       Lateral bending test: deferred today       Patrick's Maneuver: deferred today                   Gait & Posture Assessment  Ambulation: Patient ambulates using a  cane Gait: Relatively normal for age and body habitus Posture: WNL    Assessment   Status Diagnosis  Persistent Persistent Controlled 1. Lumbar spondylosis   2. Osteoarthritis of shoulder (Bilateral)   3. DDD (degenerative disc disease), cervical   4. Numbness and tingling of upper extremity (Bilateral)   5. Chronic pain syndrome   6. Opioid-induced constipation (OIC)   7. Myofascial pain   8. Restless leg syndrome   9. Insomnia, unspecified type      Updated Problems: No problems updated.  Plan of Care  Pharmacotherapy (Medications Ordered): Meds ordered this encounter  Medications  . Oxycodone HCl 20 MG TABS    Sig: Take 1 tablet (20 mg total) by mouth every 8 (eight) hours as needed for up to 30 days.    Dispense:  90 tablet    Refill:  0    Do not place this medication, or any other prescription from our practice, on "Automatic Refill". Patient may have prescription filled one day early if pharmacy is closed on scheduled refill date.    Order Specific Question:   Supervising Provider    Answer:   Milinda Pointer [phone]  . Oxycodone HCl 20 MG TABS    Sig: Take 1 tablet (20 mg total) by mouth every 8 (eight) hours as needed for up to 30 days.    Dispense:  90 tablet    Refill:  0    Do not place this medication, or any other prescription from our practice, on "Automatic Refill". Patient may have prescription filled one day early if pharmacy is closed on scheduled refill date.    Order Specific Question:   Supervising Provider    Answer:   Milinda Pointer [phone]  . Oxycodone HCl 20 MG TABS    Sig: Take 1 tablet (20 mg total) by mouth every 8 (eight) hours as needed for up to 30 days.    Dispense:  90 tablet    Refill:  0    Do not place this medication, or any other prescription from our practice, on "Automatic Refill". Patient may have prescription filled one day early if pharmacy is closed on scheduled refill date.    Order Specific Question:   Supervising  Provider    Answer:   Milinda Pointer [phone]  . polyethylene glycol (MIRALAX / GLYCOLAX) packet    Sig: Take 17 g by mouth daily as needed for moderate constipation.    Dispense:  14 each    Refill:  2    Do not place medication on "Automatic Refill". Fill one day early if pharmacy is closed on scheduled refill date.    Order Specific Question:   Supervising Provider    Answer:   Milinda Pointer [phone]  . rOPINIRole (REQUIP) 2 MG tablet    Sig: Take 2 tablets (4 mg total) by mouth at bedtime.    Dispense:  60 tablet    Refill:  2    Order Specific Question:   Supervising Provider    Answer:   Milinda Pointer [phone]  . Melatonin 10 MG CAPS    Sig: Take 20 mg by mouth at bedtime as needed.    Dispense:  60 capsule    Refill:  2    Order Specific Question:   Supervising Provider    AnswerMilinda Pointer [[id number]]   Administered today: [name] had no medications administered during this visit.  Orders:  No orders of the defined types were placed in this encounter.  Follow-up plan:   Return in about 3 months (around 01/31/2019) for MedMgmt.  Not at this time.   Interventional options:  Planned, scheduled, and/or pending: NOTE:Eliquisanticoagulation (stop x3 daysprior to procedure and restart 6 hours afterprocedures) NOTE:NO more RFA. Poor candidate due to unreliable, slow responses to test stimulation. None at this time. Consider cervical MRI.    Considering: Diagnostic bilateral sacroiliac joint block Diagnostic bilateral intra-articular hip joint injection   Palliative PRN treatment(s): Palliative midline cervical ESI for radicular shoulder pain. Palliative bilateral lumbar facet blocksfor low back pain.    Note by: Dionisio David, NP Date: 10/31/2018; Time: 2:51 PM

## 2018-10-31 NOTE — Patient Instructions (Addendum)
____________________________________________________________________________________________  Medication Rules  Purpose: To inform patients, and their family members, of our rules and regulations.  Applies to: All patients receiving prescriptions (written or electronic).  Pharmacy of record: Pharmacy where electronic prescriptions will be sent. If written prescriptions are taken to a different pharmacy, please inform the nursing staff. The pharmacy listed in the electronic medical record should be the one where you would like electronic prescriptions to be sent.  Electronic prescriptions: In compliance with the [place] Strengthen Opioid Misuse Prevention (STOP) Act of 2017 (Session Law 2017-74/H243), effective August 23, 2018, all controlled substances must be electronically prescribed. Calling prescriptions to the pharmacy will cease to exist.  Prescription refills: Only during scheduled appointments. Applies to all prescriptions.  NOTE: The following applies primarily to controlled substances (Opioid* Pain Medications).   Patient's responsibilities: 1. Pain Pills: Bring all pain pills to every appointment (except for procedure appointments). 2. Pill Bottles: Bring pills in original pharmacy bottle. Always bring the newest bottle. Bring bottle, even if empty. 3. Medication refills: You are responsible for knowing and keeping track of what medications you take and those you need refilled. The day before your appointment: write a list of all prescriptions that need to be refilled. The day of the appointment: give the list to the admitting nurse. Prescriptions will be written only during appointments. No prescriptions will be written on procedure days. If you forget a medication: it will not be "Called in", "Faxed", or "electronically sent". You will need to get another appointment to get these prescribed. No early refills. Do not call asking to have your prescription filled  early. 4. Prescription Accuracy: You are responsible for carefully inspecting your prescriptions before leaving our office. Have the discharge nurse carefully go over each prescription with you, before taking them home. Make sure that your name is accurately spelled, that your address is correct. Check the name and dose of your medication to make sure it is accurate. Check the number of pills, and the written instructions to make sure they are clear and accurate. Make sure that you are given enough medication to last until your next medication refill appointment. 5. Taking Medication: Take medication as prescribed. When it comes to controlled substances, taking less pills or less frequently than prescribed is permitted and encouraged. Never take more pills than instructed. Never take medication more frequently than prescribed.  6. Inform other Doctors: Always inform, all of your healthcare providers, of all the medications you take. 7. Pain Medication from other Providers: You are not allowed to accept any additional pain medication from any other Doctor or Healthcare provider. There are two exceptions to this rule. (see below) In the event that you require additional pain medication, you are responsible for notifying us, as stated below. 8. Medication Agreement: You are responsible for carefully reading and following our Medication Agreement. This must be signed before receiving any prescriptions from our practice. Safely store a copy of your signed Agreement. Violations to the Agreement will result in no further prescriptions. (Additional copies of our Medication Agreement are available upon request.) 9. Laws, Rules, & Regulations: All patients are expected to follow all Federal and State Laws, Statutes, Rules, & Regulations. Ignorance of the Laws does not constitute a valid excuse. The use of any illegal substances is prohibited. 10. Adopted CDC guidelines & recommendations: Target dosing levels will be  at or below 60 MME/day. Use of benzodiazepines** is not recommended.  Exceptions: There are only two exceptions to the rule of not   receiving pain medications from other Healthcare Providers. 1. Exception #1 (Emergencies): In the event of an emergency (i.e.: accident requiring emergency care), you are allowed to receive additional pain medication. However, you are responsible for: As soon as you are able, call our office [phone], at any time of the day or night, and leave a message stating your name, the date and nature of the emergency, and the name and dose of the medication prescribed. In the event that your call is answered by a member of our staff, make sure to document and save the date, time, and the name of the person that took your information.  2. Exception #2 (Planned Surgery): In the event that you are scheduled by another doctor or dentist to have any type of surgery or procedure, you are allowed (for a period no longer than 30 days), to receive additional pain medication, for the acute post-op pain. However, in this case, you are responsible for picking up a copy of our "Post-op Pain Management for Surgeons" handout, and giving it to your surgeon or dentist. This document is available at our office, and does not require an appointment to obtain it. Simply go to our office during business hours (Monday-Thursday from 8:00 AM to 4:00 PM) (Friday 8:00 AM to 12:00 Noon) or if you have a scheduled appointment with [name], prior to your surgery, and ask for it by name. In addition, you will need to provide [name] with your name, name of your surgeon, type of surgery, and date of procedure or surgery.  *Opioid medications include: morphine, codeine, oxycodone, oxymorphone, hydrocodone, hydromorphone, meperidine, tramadol, tapentadol, buprenorphine, fentanyl, methadone. **Benzodiazepine medications include: diazepam (Valium), alprazolam (Xanax), clonazepam (Klonopine), lorazepam (Ativan), clorazepate  (Tranxene), chlordiazepoxide (Librium), estazolam (Prosom), oxazepam (Serax), temazepam (Restoril), triazolam (Halcion) (Last updated: 10/20/2017) ____________________________________________________________________________________________ Three prescriptions for Oxycodone, one for Requip, Miralax, and Melatonin were sent to your pharmacy.

## 2018-11-02 ENCOUNTER — Encounter: Payer: Self-pay | Admitting: Nurse Practitioner

## 2018-11-24 ENCOUNTER — Other Ambulatory Visit: Payer: Self-pay

## 2018-11-24 DIAGNOSIS — J302 Other seasonal allergic rhinitis: Secondary | ICD-10-CM

## 2018-11-24 MED ORDER — LEVOCETIRIZINE DIHYDROCHLORIDE 5 MG PO TABS: 5.0000 mg | ORAL_TABLET | Freq: Every day | ORAL | 2 refills | Status: DC

## 2018-12-21 ENCOUNTER — Telehealth: Payer: Self-pay | Admitting: Nurse Practitioner

## 2018-12-21 NOTE — Telephone Encounter (Signed)
Pt called said that she was told that if she have heart condition she do not need to take Duloxetine, pt also states that it was making her itch. Please advise pt.

## 2018-12-21 NOTE — Telephone Encounter (Signed)
As per patient read for side effect if had HTN  And heart diseases patient should not take this medicine and her side effect is itchyness mostly in arm and back,, mouth sore have been taking from several months now. Please suggest ?

## 2018-12-21 NOTE — Telephone Encounter (Signed)
Covering inbox for Kristin [name], AGPCNP-BC while she is out of office on maternity leave.  Patient was seen for this issue chronic pain among other concerns 07/2018, started on Duloxetine low dose 20mg  daily, patient took it for now almost 5 months, had issues with some side effects listed below in messages here.  I called her to discuss briefly, expressed that this medicine does not seem to be working well for her, and that it has been long enough, and her symptoms or side effects are persistent, therefore I recommend tapering off this medicine for now.  She skipped dose today. I advised to take it 1 capsule every OTHER day for 1 week. Then take 1 capsule every 3rd day for 1-2 week then stop.  Her symptoms seem to not be severe or worsening, and therefore agrees that she would prefer to taper off of it, rather than stop suddenly.  She understands and will notify us if any problem. I advised that Kristin [name] mentioned another medicine such as Gabapentin that we could consider if need in future.  She should follow-up with Kristin [name] in about 3 months.  Kristin [name], [facility] 12/21/2018, 2:11 PM

## 2019-01-04 ENCOUNTER — Other Ambulatory Visit: Payer: Self-pay | Admitting: Nurse Practitioner

## 2019-01-04 DIAGNOSIS — G2581 Restless legs syndrome: Secondary | ICD-10-CM

## 2019-01-05 ENCOUNTER — Other Ambulatory Visit: Payer: Self-pay

## 2019-01-05 DIAGNOSIS — I10 Essential (primary) hypertension: Secondary | ICD-10-CM

## 2019-01-10 ENCOUNTER — Other Ambulatory Visit: Payer: Self-pay | Admitting: *Deleted

## 2019-01-18 ENCOUNTER — Encounter: Payer: Self-pay | Admitting: Pain Medicine

## 2019-01-22 ENCOUNTER — Ambulatory Visit: Payer: Medicare Other | Attending: Nurse Practitioner | Admitting: Pain Medicine

## 2019-01-22 ENCOUNTER — Other Ambulatory Visit: Payer: Self-pay

## 2019-01-22 DIAGNOSIS — J431 Panlobular emphysema: Secondary | ICD-10-CM

## 2019-01-22 DIAGNOSIS — M79605 Pain in left leg: Secondary | ICD-10-CM

## 2019-01-22 DIAGNOSIS — M5442 Lumbago with sciatica, left side: Secondary | ICD-10-CM | POA: Diagnosis not present

## 2019-01-22 DIAGNOSIS — Z01818 Encounter for other preprocedural examination: Secondary | ICD-10-CM | POA: Insufficient documentation

## 2019-01-22 DIAGNOSIS — M961 Postlaminectomy syndrome, not elsewhere classified: Secondary | ICD-10-CM

## 2019-01-22 DIAGNOSIS — G894 Chronic pain syndrome: Secondary | ICD-10-CM | POA: Diagnosis not present

## 2019-01-22 DIAGNOSIS — M5137 Other intervertebral disc degeneration, lumbosacral region: Secondary | ICD-10-CM

## 2019-01-22 DIAGNOSIS — M160 Bilateral primary osteoarthritis of hip: Secondary | ICD-10-CM

## 2019-01-22 DIAGNOSIS — M79604 Pain in right leg: Secondary | ICD-10-CM

## 2019-01-22 DIAGNOSIS — T402X5A Adverse effect of other opioids, initial encounter: Secondary | ICD-10-CM

## 2019-01-22 DIAGNOSIS — M5030 Other cervical disc degeneration, unspecified cervical region: Secondary | ICD-10-CM

## 2019-01-22 DIAGNOSIS — M5441 Lumbago with sciatica, right side: Secondary | ICD-10-CM

## 2019-01-22 DIAGNOSIS — G8929 Other chronic pain: Secondary | ICD-10-CM

## 2019-01-22 DIAGNOSIS — G4700 Insomnia, unspecified: Secondary | ICD-10-CM

## 2019-01-22 DIAGNOSIS — Z7901 Long term (current) use of anticoagulants: Secondary | ICD-10-CM

## 2019-01-22 DIAGNOSIS — G2581 Restless legs syndrome: Secondary | ICD-10-CM

## 2019-01-22 DIAGNOSIS — K5903 Drug induced constipation: Secondary | ICD-10-CM

## 2019-01-22 DIAGNOSIS — J439 Emphysema, unspecified: Secondary | ICD-10-CM

## 2019-01-22 DIAGNOSIS — M5412 Radiculopathy, cervical region: Secondary | ICD-10-CM

## 2019-01-22 DIAGNOSIS — M25559 Pain in unspecified hip: Secondary | ICD-10-CM

## 2019-01-22 MED ORDER — OXYCODONE HCL 20 MG PO TABS: 20.0000 mg | ORAL_TABLET | Freq: Three times a day (TID) | ORAL | 0 refills | Status: DC | PRN

## 2019-01-22 MED ORDER — ROPINIROLE HCL 2 MG PO TABS: 4.0000 mg | ORAL_TABLET | Freq: Every day | ORAL | 2 refills | Status: DC

## 2019-01-22 MED ORDER — MELATONIN 10 MG PO CAPS: 20.0000 mg | ORAL_CAPSULE | Freq: Every evening | ORAL | 2 refills | Status: DC | PRN

## 2019-01-22 MED ORDER — POLYETHYLENE GLYCOL 3350 17 G PO PACK: 17.0000 g | PACK | Freq: Every day | ORAL | 2 refills | Status: DC | PRN

## 2019-01-22 MED ORDER — ALBUTEROL SULFATE (2.5 MG/3ML) 0.083% IN NEBU: 2.5000 mg | INHALATION_SOLUTION | Freq: Three times a day (TID) | RESPIRATORY_TRACT | 12 refills | Status: DC | PRN

## 2019-01-22 MED ORDER — BISACODYL 5 MG PO TBEC: 10.0000 mg | DELAYED_RELEASE_TABLET | Freq: Every day | ORAL | 2 refills | Status: DC | PRN

## 2019-01-22 NOTE — Progress Notes (Signed)
Pain Management Virtual Encounter Note - Virtual Visit via Telephone Telehealth (real-time audio visits between healthcare provider and patient).  Patient's Phone No. & Preferred Pharmacy:  [phone] (home); There is no such number on file (mobile).; (Preferred) [phone] [email or web address]  [place], [place] [address] [place] [address] Phone: [phone] Fax: [phone]  [id number] - [place], [place] [address] [place] [address] Phone: [phone] Fax: [phone]   Pre-screening note:  Our staff contacted Kristin [name] and offered her an "in person", "face-to-face" appointment versus a telephone encounter. She indicated preferring the telephone encounter, at this time.  Reason for Virtual Visit: COVID-19*  Social distancing based on CDC and AMA recommendations.   I contacted Kristin [name] on 01/22/2019 at 1:04 PM via telephone.      I clearly identified myself as Gaspar Cola, MD. I verified that I was speaking with the correct person using two identifiers (Name and date of birth: Nov 10, 1929).  Advanced Informed Consent I sought verbal advanced consent from Kristin [name] for virtual visit interactions. I informed Kristin [name] of possible security and privacy concerns, risks, and limitations associated with providing "not-in-person" medical evaluation and management services. I also informed Kristin [name] of the availability of "in-person" appointments. Finally, I informed her that there would be a charge for the virtual visit and that she could be  personally, fully or partially, financially responsible for it. Kristin [name] expressed understanding and agreed to proceed.   Historic Elements   Kristin [name] is a 85 y.o. year old, female patient evaluated today after her last encounter by our practice on 01/04/2019. Kristin [name]  has a past medical history of Acute postoperative pain (02/09/2018),  Arrhythmia, sinus node (04/03/2014), Arthritis, Arthritis, Atrial fibrillation ([place]), Back pain, Bradycardia, Breast cancer ([place]) (2004), Cancer ([place]) (2004), CHF (congestive heart failure) ([place]), Chronic back pain, CKD (chronic kidney disease), COPD (chronic obstructive pulmonary disease) ([place]), Cystocele, Decubitus ulcers, Dehydration, Gout, Hematuria, Hepatitis C, Hyperlipidemia, Hypertension, Hypomagnesemia (06/25/2015), OAB (overactive bladder), Overactive bladder, Restless leg, Shoulder pain, bilateral, Urinary frequency, UTI (lower urinary tract infection), and Vaginal atrophy. She also  has a past surgical history that includes Abdominal hysterectomy; Back surgery; Rotator cuff repair; Breast surgery; Cataract extraction w/PHACO (10/19/2011); Cataract extraction w/PHACO (11/02/2011); Carpal tunnel release; Joint replacement; Colon surgery; Pacemaker insertion (N/A, 12/24/2015); Cholecystectomy; Breast lumpectomy (Right, 2004); and Insert / replace / remove pacemaker. Kristin [name] has a current medication list which includes the following prescription(s): albuterol, apixaban, vitamin d, furosemide, levocetirizine, lisinopril, melatonin, oxycodone hcl, oxygen-helium, polyethylene glycol, ropinirole, bisacodyl, oxycodone hcl, and oxycodone hcl. She  reports that she quit smoking about 20 years ago. Her smoking use included cigarettes. She has a 50.00 pack-year smoking history. She has never used smokeless tobacco. She reports that she does not drink alcohol or use drugs. Kristin [name] is allergic to flexeril [cyclobenzaprine hcl] and vancomycin.   HPI  I last communicated with her on 09/19/2018. Today, she is being contacted for medication management.  Pharmacotherapy Assessment  Analgesic: Oxycodone IR 20 mg every 8 hours (60 mg/day of oxycodone) MME/day: 90 mg/day.   Monitoring: Pharmacotherapy: No side-effects or adverse reactions reported. [name] PMP: PDMP reviewed during this encounter.       Compliance: No  problems identified. Effectiveness: Clinically acceptable. Plan: Refer to "POC".  Pertinent Labs  Renal Function Lab Results  Component Value Date   BUN 29 (H) 07/25/2018  CREATININE 1.49 (H) 07/25/2018   BCR 19 07/25/2018   GFRAA 36 (L) 07/25/2018   GFRNONAA 31 (L) 07/25/2018   Hepatic Function Lab Results  Component Value Date   AST 21 07/25/2018   ALT 11 07/25/2018   ALBUMIN 3.6 08/22/2017   UDS Summary  Date Value Ref Range Status  02/01/2018 FINAL  Final    Comment:    ==================================================================== TOXASSURE SELECT 13 (MW) ==================================================================== Test                             Result       Flag       Units Drug Present and Declared for Prescription Verification   Oxycodone                      401          EXPECTED   ng/mg creat   Oxymorphone                    438          EXPECTED   ng/mg creat   Noroxycodone                   1122         EXPECTED   ng/mg creat   Noroxymorphone                 309          EXPECTED   ng/mg creat    Sources of oxycodone are scheduled prescription medications.    Oxymorphone, noroxycodone, and noroxymorphone are expected    metabolites of oxycodone. Oxymorphone is also available as a    scheduled prescription medication. ==================================================================== Test                      Result    Flag   Units      Ref Range   Creatinine              79               mg/dL      >=20 ==================================================================== Declared Medications:  The flagging and interpretation on this report are based on the  following declared medications.  Unexpected results may arise from  inaccuracies in the declared medications.  **Note: The testing scope of this panel includes these medications:  Oxycodone  **Note: The testing scope of this panel does not include following  reported medications:   Albuterol (Proventil)  Apixaban  Bisacodyl  Furosemide (Lasix)  Lisinopril  Melatonin  Polyethylene Glycol (MiraLAX)  Ropinirole  Vitamin D ==================================================================== For clinical consultation, please call [phone]. ====================================================================    Note: Above Lab results reviewed.  Recent imaging  US RENAL CLINICAL DATA:  Hematuria.  EXAM: RENAL / URINARY TRACT ULTRASOUND COMPLETE  COMPARISON:  Abdominal MRI 07/27/2010  FINDINGS: Right Kidney:  Renal measurements: 8.8 x 3.7 x 4.7 cm = volume: 81 mL . Echogenicity within normal limits. No mass or hydronephrosis visualized.  Left Kidney:  Renal measurements: 8.4 x 4.1 x 3.9 cm = volume: 70 mL. Echogenicity within normal limits. No solid mass or hydronephrosis visualized. 1.7 cm cyst.  Bladder:  Appears normal for degree of bladder distention.  IMPRESSION: 1.7 cm left renal cyst, otherwise unremarkable appearance of the kidneys. No hydronephrosis.  Electronically Signed   By: Seymour Bars.D.  On: 10/25/2018 08:38  Assessment  The primary encounter diagnosis was Chronic pain syndrome. 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  Plan of Care  I have discontinued Kristin [name].  Kristin [name] fluconazole. I have also changed her Oxycodone HCl, Oxycodone HCl, Oxycodone HCl, polyethylene glycol, rOPINIRole, and albuterol. Additionally, I am having her maintain her apixaban, OXYGEN, Vitamin D, lisinopril, furosemide, levocetirizine, Melatonin, and bisacodyl.  Pharmacotherapy (Medications Ordered): Meds ordered this encounter  Medications  . Oxycodone HCl 20 MG TABS    Sig: Take 1 tablet (20 mg total) by mouth every 8 (eight) hours as needed for up to 30 days. Must last 30 days    Dispense:  90 tablet    Refill:  0    Chronic Pain: STOP Act - Not applicable. Fill 1 day early if closed on scheduled refill date. Do not fill until: 04/03/19. To last until: 05/03/19. Instruct to avoid benzodiazepines within 8 hours of opioid.  . Oxycodone HCl 20 MG TABS    Sig: Take 1 tablet (20 mg total) by mouth every 8 (eight) hours as needed for up to 30 days. Must last 30 days    Dispense:  90 tablet    Refill:  0    Chronic Pain: STOP Act - Not applicable. Fill 1 day early if closed on scheduled refill date. Do not fill until: 03/04/19. To last until: 04/03/19. Instruct to avoid benzodiazepines within 8 hours of opioid.  . Oxycodone HCl 20 MG TABS    Sig: Take 1 tablet (20 mg total) by mouth every 8 (eight) hours as needed for up to 30 days. Must last 30 days    Dispense:  90 tablet    Refill:  0    Chronic Pain: STOP Act - Not applicable. Fill 1 day early if closed on scheduled refill date. Do not fill until: 02/02/19. To last until: 03/04/19. Instruct to avoid benzodiazepines within 8 hours of opioid.  . Melatonin 10 MG CAPS    Sig: Take 20 mg by mouth at bedtime as needed.    Dispense:  60 capsule    Refill:  2    Fill one day early if pharmacy is closed on scheduled refill date. May substitute for generic if available.  . polyethylene glycol (MIRALAX / GLYCOLAX) 17 g packet    Sig: Take 17 g by mouth daily as needed for moderate constipation. Must last 30 days    Dispense:  30 packet     Refill:  2    Fill one day early if pharmacy is closed on scheduled refill date. May substitute for generic if available.  Marland Kitchen rOPINIRole (REQUIP) 2 MG tablet    Sig: Take 2 tablets (4 mg total) by mouth at bedtime. Must last 30 days    Dispense:  60 tablet    Refill:  2    Fill one day early if pharmacy is closed on scheduled refill date. May substitute for generic if available.  . bisacodyl (DULCOLAX) 5 MG EC tablet  Sig: Take 2 tablets (10 mg total) by mouth daily as needed for moderate constipation.    Dispense:  60 tablet    Refill:  2    Fill one day early if pharmacy is closed on scheduled refill date. May substitute for generic if available.  Marland Kitchen albuterol (PROVENTIL) (2.5 MG/3ML) 0.083% nebulizer solution    Sig: Take 3 mLs (2.5 mg total) by nebulization every 8 (eight) hours as needed for wheezing or shortness of breath.    Dispense:  75 mL    Refill:  12    Fill one day early if pharmacy is closed on scheduled refill date. May substitute for generic if available.   Orders:  Orders Placed This Encounter  Procedures  . Novel Coronavirus, NAA ([facility])    Send patient to pre-admission testing for collection. Estimated turn-around time: 72 hrs.    Standing Status:   Future    Standing Expiration Date:   04/24/2019    Order Specific Question:   Known Exposure    Answer:   No known exposure. Pre-procedure testing.  . MR HIP RIGHT WO CONTRAST    Standing Status:   Future    Standing Expiration Date:   04/24/2019    Order Specific Question:   What is the patient's sedation requirement?    Answer:   No Sedation    Order Specific Question:   Does the patient have a pacemaker or implanted devices?    Answer:   No    Order Specific Question:   Preferred imaging location?    Answer:   [facility] (table limit-350lbs)    Order Specific Question:   Call Results- Best Contact Number?    Answer:   [phone]    Order Specific Question:   Radiology Contrast Protocol - do NOT  remove file path    Answer:   \\[email or web address]  . MR HIP LEFT WO CONTRAST    Standing Status:   Future    Standing Expiration Date:   04/24/2019    Order Specific Question:   What is the patient's sedation requirement?    Answer:   No Sedation    Order Specific Question:   Does the patient have a pacemaker or implanted devices?    Answer:   No    Order Specific Question:   Preferred imaging location?    Answer:   [facility] (table limit-350lbs)    Order Specific Question:   Call Results- Best Contact Number?    Answer:   [phone]    Order Specific Question:   Radiology Contrast Protocol - do NOT remove file path    Answer:   \\[email or web address]  . Blood Thinner Instructions to Nursing    If the patient requires a Lovenox-bridge therapy, make sure arrangements are made to institute it with the assistance of the PCP.    Standing Status:   Standing    Number of Occurrences:   36    Standing Expiration Date:   07/23/2020    Scheduling Instructions:     Always stop the Eliquis (Apixaban) x 3 days prior to procedure or surgery.   Follow-up plan:   Return for (3 mo) Med-Mgmt, in addition, Procedure, (w/ sedation), (Blood-thinner Protocol).  The patient indicates currently having pain in both of her shoulders, both of her hips, and her lower back.  She wants to come in to have a procedure done.  We will have to do that COVID testing first.  She also needs to stop  her Eliquis for 3 days prior to procedure.  The same day that we do the coag testing we should stop the Eliquis.  When she comes in we will do a physical exam and determine at that time what is it that is bothering her the most.   I discussed the assessment and treatment plan with the patient. The patient was provided an opportunity to ask questions and all were answered. The patient agreed with the plan and demonstrated an understanding of the instructions.  Patient  advised to call back or seek an in-person evaluation if the symptoms or condition worsens.  Total duration of non-face-to-face encounter: 12 minutes.  Note by: Gaspar Cola, MD Date: 01/22/2019; Time: 1:28 PM  Note: This dictation was prepared with Dragon dictation. Any transcriptional errors that may result from this process are unintentional.  Disclaimer:  * Given the special circumstances of the COVID-19 pandemic, the federal government has announced that the Office for Civil Rights (OCR) will exercise its enforcement discretion and will not impose penalties on physicians using telehealth in the event of noncompliance with regulatory requirements under the [place] and [place] (HIPAA) in connection with the good faith provision of telehealth during the MWUXL-24 national public health emergency. ([place])

## 2019-01-22 NOTE — Patient Instructions (Signed)
____________________________________________________________________________________________  Preparing for Procedure with Sedation  Procedure appointments are limited to planned procedures: . No Prescription Refills. . No disability issues will be discussed. . No medication changes will be discussed.  Instructions: . Oral Intake: Do not eat or drink anything for at least 8 hours prior to your procedure. . Transportation: Public transportation is not allowed. Bring an adult driver. The driver must be physically present in our waiting room before any procedure can be started. Marland Kitchen Physical Assistance: Bring an adult physically capable of assisting you, in the event you need help. This adult should keep you company at home for at least 6 hours after the procedure. . Blood Pressure Medicine: Take your blood pressure medicine with a sip of water the morning of the procedure. . Blood thinners: Notify our staff if you are taking any blood thinners. Depending on which one you take, there will be specific instructions on how and when to stop it. . Diabetics on insulin: Notify the staff so that you can be scheduled 1st case in the morning. If your diabetes requires high dose insulin, take only  of your normal insulin dose the morning of the procedure and notify the staff that you have done so. . Preventing infections: Shower with an antibacterial soap the morning of your procedure. . Build-up your immune system: Take 1000 mg of Vitamin C with every meal (3 times a day) the day prior to your procedure. Marland Kitchen Antibiotics: Inform the staff if you have a condition or reason that requires you to take antibiotics before dental procedures. . Pregnancy: If you are pregnant, call and cancel the procedure. . Sickness: If you have a cold, fever, or any active infections, call and cancel the procedure. . Arrival: You must be in the facility at least 30 minutes prior to your scheduled procedure. . Children: Do not bring  children with you. . Dress appropriately: Bring dark clothing that you would not mind if they get stained. . Valuables: Do not bring any jewelry or valuables.  Reasons to call and reschedule or cancel your procedure: (Following these recommendations will minimize the risk of a serious complication.) . Surgeries: Avoid having procedures within 2 weeks of any surgery. (Avoid for 2 weeks before or after any surgery). . Flu Shots: Avoid having procedures within 2 weeks of a flu shots or . (Avoid for 2 weeks before or after immunizations). . Barium: Avoid having a procedure within 7-10 days after having had a radiological study involving the use of radiological contrast. (Myelograms, Barium swallow or enema study). . Heart attacks: Avoid any elective procedures or surgeries for the initial 6 months after a "Myocardial Infarction" (Heart Attack). . Blood thinners: It is imperative that you stop these medications before procedures. Let us know if you if you take any blood thinner.  . Infection: Avoid procedures during or within two weeks of an infection (including chest colds or gastrointestinal problems). Symptoms associated with infections include: Localized redness, fever, chills, night sweats or profuse sweating, burning sensation when voiding, cough, congestion, stuffiness, runny nose, sore throat, diarrhea, nausea, vomiting, cold or Flu symptoms, recent or current infections. It is specially important if the infection is over the area that we intend to treat. Marland Kitchen Heart and lung problems: Symptoms that may suggest an active cardiopulmonary problem include: cough, chest pain, breathing difficulties or shortness of breath, dizziness, ankle swelling, uncontrolled high or unusually low blood pressure, and/or palpitations. If you are experiencing any of these symptoms, cancel your procedure and contact  your primary care physician for an evaluation.  Remember:  Regular Business hours are:  Monday to Thursday  8:00 AM to 4:00 PM  Provider's Schedule: Milinda Pointer, MD:  Procedure days: Tuesday and Thursday 7:30 AM to 4:00 PM  Gillis Santa, MD:  Procedure days: Monday and Wednesday 7:30 AM to 4:00 PM ____________________________________________________________________________________________   ____________________________________________________________________________________________  Medication Rules  Purpose: To inform patients, and their family members, of our rules and regulations.  Applies to: All patients receiving prescriptions (written or electronic).  Pharmacy of record: Pharmacy where electronic prescriptions will be sent. If written prescriptions are taken to a different pharmacy, please inform the nursing staff. The pharmacy listed in the electronic medical record should be the one where you would like electronic prescriptions to be sent.  Electronic prescriptions: In compliance with the [place] (STOP) Act of 2017 (Session Lanny Cramp [phone]), effective August 23, 2018, all controlled substances must be electronically prescribed. Calling prescriptions to the pharmacy will cease to exist.  Prescription refills: Only during scheduled appointments. Applies to all prescriptions.  NOTE: The following applies primarily to controlled substances (Opioid* Pain Medications).   Patient's responsibilities: 1. Pain Pills: Bring all pain pills to every appointment (except for procedure appointments). 2. Pill Bottles: Bring pills in original pharmacy bottle. Always bring the newest bottle. Bring bottle, even if empty. 3. Medication refills: You are responsible for knowing and keeping track of what medications you take and those you need refilled. The day before your appointment: write a list of all prescriptions that need to be refilled. The day of the appointment: give the list to the admitting nurse. Prescriptions will be written only during  appointments. No prescriptions will be written on procedure days. If you forget a medication: it will not be "Called in", "Faxed", or "electronically sent". You will need to get another appointment to get these prescribed. No early refills. Do not call asking to have your prescription filled early. 4. Prescription Accuracy: You are responsible for carefully inspecting your prescriptions before leaving our office. Have the discharge nurse carefully go over each prescription with you, before taking them home. Make sure that your name is accurately spelled, that your address is correct. Check the name and dose of your medication to make sure it is accurate. Check the number of pills, and the written instructions to make sure they are clear and accurate. Make sure that you are given enough medication to last until your next medication refill appointment. 5. Taking Medication: Take medication as prescribed. When it comes to controlled substances, taking less pills or less frequently than prescribed is permitted and encouraged. Never take more pills than instructed. Never take medication more frequently than prescribed.  6. Inform other Doctors: Always inform, all of your healthcare providers, of all the medications you take. 7. Pain Medication from other Providers: You are not allowed to accept any additional pain medication from any other Doctor or Healthcare provider. There are two exceptions to this rule. (see below) In the event that you require additional pain medication, you are responsible for notifying us, as stated below. 8. Medication Agreement: You are responsible for carefully reading and following our Medication Agreement. This must be signed before receiving any prescriptions from our practice. Safely store a copy of your signed Agreement. Violations to the Agreement will result in no further prescriptions. (Additional copies of our Medication Agreement are available upon request.) 9. Laws, Rules,  & Regulations: All patients are expected to follow all Federal  and [employer], Rules, & Regulations. Ignorance of the Laws does not constitute a valid excuse. The use of any illegal substances is prohibited. 10. Adopted CDC guidelines & recommendations: Target dosing levels will be at or below 60 MME/day. Use of benzodiazepines** is not recommended.  Exceptions: There are only two exceptions to the rule of not receiving pain medications from other Healthcare Providers. 1. Exception #1 (Emergencies): In the event of an emergency (i.e.: accident requiring emergency care), you are allowed to receive additional pain medication. However, you are responsible for: As soon as you are able, call our office [phone], at any time of the day or night, and leave a message stating your name, the date and nature of the emergency, and the name and dose of the medication prescribed. In the event that your call is answered by a member of our staff, make sure to document and save the date, time, and the name of the person that took your information.  2. Exception #2 (Planned Surgery): In the event that you are scheduled by another doctor or dentist to have any type of surgery or procedure, you are allowed (for a period no longer than 30 days), to receive additional pain medication, for the acute post-op pain. However, in this case, you are responsible for picking up a copy of our "Post-op Pain Management for Surgeons" handout, and giving it to your surgeon or dentist. This document is available at our office, and does not require an appointment to obtain it. Simply go to our office during business hours (Monday-Thursday from 8:00 AM to 4:00 PM) (Friday 8:00 AM to 12:00 Noon) or if you have a scheduled appointment with [name], prior to your surgery, and ask for it by name. In addition, you will need to provide [name] with your name, name of your surgeon, type of surgery, and date of procedure or surgery.  *Opioid  medications include: morphine, codeine, oxycodone, oxymorphone, hydrocodone, hydromorphone, meperidine, tramadol, tapentadol, buprenorphine, fentanyl, methadone. **Benzodiazepine medications include: diazepam (Valium), alprazolam (Xanax), clonazepam (Klonopine), lorazepam (Ativan), clorazepate (Tranxene), chlordiazepoxide (Librium), estazolam (Prosom), oxazepam (Serax), temazepam (Restoril), triazolam (Halcion) (Last updated: 10/20/2017) ____________________________________________________________________________________________   ____________________________________________________________________________________________  Medication Recommendations and Reminders  Applies to: All patients receiving prescriptions (written and/or electronic).  Medication Rules & Regulations: These rules and regulations exist for your safety and that of others. They are not flexible and neither are we. Dismissing or ignoring them will be considered "non-compliance" with medication therapy, resulting in complete and irreversible termination of such therapy. (See document titled "Medication Rules" for more details.) In all conscience, because of safety reasons, we cannot continue providing a therapy where the patient does not follow instructions.  Pharmacy of record:   Definition: This is the pharmacy where your electronic prescriptions will be sent.   We do not endorse any particular pharmacy.  You are not restricted in your choice of pharmacy.  The pharmacy listed in the electronic medical record should be the one where you want electronic prescriptions to be sent.  If you choose to change pharmacy, simply notify our nursing staff of your choice of new pharmacy.  Recommendations:  Keep all of your pain medications in a safe place, under lock and key, even if you live alone.   After you fill your prescription, take 1 week's worth of pills and put them away in a safe place. You should keep a separate,  properly labeled bottle for this purpose. The remainder should be kept in the original bottle. Use  this as your primary supply, until it runs out. Once it's gone, then you know that you have 1 week's worth of medicine, and it is time to come in for a prescription refill. If you do this correctly, it is unlikely that you will ever run out of medicine.  To make sure that the above recommendation works, it is very important that you make sure your medication refill appointments are scheduled at least 1 week before you run out of medicine. To do this in an effective manner, make sure that you do not leave the office without scheduling your next medication management appointment. Always ask the nursing staff to show you in your prescription , when your medication will be running out. Then arrange for the receptionist to get you a return appointment, at least 7 days before you run out of medicine. Do not wait until you have 1 or 2 pills left, to come in. This is very poor planning and does not take into consideration that we may need to cancel appointments due to bad weather, sickness, or emergencies affecting our staff.  "Partial Fill": If for any reason your pharmacy does not have enough pills/tablets to completely fill or refill your prescription, do not allow for a "partial fill". You will need a separate prescription to fill the remaining amount, which we will not provide. If the reason for the partial fill is your insurance, you will need to talk to the pharmacist about payment alternatives for the remaining tablets, but again, do not accept a partial fill.  Prescription refills and/or changes in medication(s):   Prescription refills, and/or changes in dose or medication, will be conducted only during scheduled medication management appointments. (Applies to both, written and electronic prescriptions.)  No refills on procedure days. No medication will be changed or started on procedure days. No changes,  adjustments, and/or refills will be conducted on a procedure day. Doing so will interfere with the diagnostic portion of the procedure.  No phone refills. No medications will be "called into the pharmacy".  No Fax refills.  No weekend refills.  No Holliday refills.  No after hours refills.  Remember:  Business hours are:  Monday to Thursday 8:00 AM to 4:00 PM Provider's Schedule: Dionisio David, NP - Appointments are:  Medication management: Monday to Thursday 8:00 AM to 4:00 PM Milinda Pointer, MD - Appointments are:  Medication management: Monday and Wednesday 8:00 AM to 4:00 PM Procedure day: Tuesday and Thursday 7:30 AM to 4:00 PM Gillis Santa, MD - Appointments are:  Medication management: Tuesday and Thursday 8:00 AM to 4:00 PM Procedure day: Monday and Wednesday 7:30 AM to 4:00 PM (Last update: 10/20/2017) ____________________________________________________________________________________________   ____________________________________________________________________________________________  CANNABIDIOL (AKA: CBD Oil or Pills)  Applies to: All patients receiving prescriptions of controlled substances (written and/or electronic).  General Information: Cannabidiol (CBD) was discovered in [birth year]. It is one of some 113 identified cannabinoids in cannabis (Marijuana) plants, accounting for up to 40% of the plant's extract. As of 2018, preliminary clinical research on cannabidiol included studies of anxiety, cognition, movement disorders, and pain.  Cannabidiol is consummed in multiple ways, including inhalation of cannabis smoke or vapor, as an aerosol spray into the cheek, and by mouth. It may be supplied as CBD oil containing CBD as the active ingredient (no added tetrahydrocannabinol (THC) or terpenes), a full-plant CBD-dominant hemp extract oil, capsules, dried cannabis, or as a liquid solution. CBD is thought not have the same psychoactivity as THC, and may affect the  actions  of THC. Studies suggest that CBD may interact with different biological targets, including cannabinoid receptors and other neurotransmitter receptors. As of 2018 the mechanism of action for its biological effects has not been determined.  In the [place], cannabidiol has a limited approval by the Food and Drug Administration (FDA) for treatment of only two types of epilepsy disorders. The side effects of long-term use of the drug include somnolence, decreased appetite, diarrhea, fatigue, malaise, weakness, sleeping problems, and others.  CBD remains a Schedule I drug prohibited for any use.  Legality: Some manufacturers ship CBD products nationally, an illegal action which the FDA has not enforced in 2018, with CBD remaining the subject of an FDA investigational new drug evaluation, and is not considered legal as a dietary supplement or food ingredient as of December 2018. Federal illegality has made it difficult historically to conduct research on CBD. CBD is openly sold in head shops and health food stores in some states where such sales have not been explicitly legalized.  Warning: Because it is not FDA approved for general use or treatment of pain, it is not required to undergo the same manufacturing controls as prescription drugs.  This means that the available cannabidiol (CBD) may be contaminated with THC.  If this is the case, it will trigger a positive urine drug screen (UDS) test for cannabinoids (Marijuana).  Because a positive UDS for illicit substances is a violation of our medication agreement, your opioid analgesics (pain medicine) may be permanently discontinued. (Last update: 11/10/2017) ____________________________________________________________________________________________

## 2019-01-23 ENCOUNTER — Encounter: Payer: Medicare Other | Admitting: Nurse Practitioner

## 2019-02-05 ENCOUNTER — Other Ambulatory Visit
Admission: RE | Admit: 2019-02-05 | Discharge: 2019-02-05 | Disposition: A | Discharge status: Home / Self Care | Payer: Medicare Other | Source: Ambulatory Visit | Attending: Pain Medicine | Admitting: Pain Medicine

## 2019-02-05 ENCOUNTER — Other Ambulatory Visit: Payer: Self-pay

## 2019-02-05 DIAGNOSIS — Z1159 Encounter for screening for other viral diseases: Secondary | ICD-10-CM | POA: Insufficient documentation

## 2019-02-06 ENCOUNTER — Ambulatory Visit: Payer: Medicare Other | Admitting: Pain Medicine

## 2019-02-06 LAB — NOVEL CORONAVIRUS, NAA (HOSP ORDER, SEND-OUT TO REF LAB; TAT 18-24 HRS): SARS-CoV-2, NAA: NOT DETECTED

## 2019-02-07 NOTE — Progress Notes (Signed)
Patient's Name: Kristin [name]  MRN: [id number]  Referring Provider: Mikey College, *  DOB: Nov 14, 1929  PCP: Mikey College, NP  DOS: 02/08/2019  Note by: Gaspar Cola, MD  Service setting: Ambulatory outpatient  Specialty: Interventional Pain Management  Patient type: Established  Location: [facility] (AMB) Pain Management Facility  Visit type: Interventional Procedure   Primary Reason for Visit: Interventional Pain Management Treatment. CC: Hip Pain (bilateral )  Procedure:          Anesthesia, Analgesia, Anxiolysis:  Type: Intra-Articular Hip Injection          Primary Purpose: Diagnostic Region: Posterolateral hip joint area. Level: Lower pelvic and hip joint level. Target Area: Superior aspect of the hip joint cavity, going thru the superior portion of the capsular ligament. Approach: Posterolateral approach. Laterality: Bilateral  Type: Moderate (Conscious) Sedation combined with Local Anesthesia Indication(s): Analgesia and Anxiety Route: Intravenous (IV) IV Access: Secured Sedation: Meaningful verbal contact was maintained at all times during the procedure  Local Anesthetic: Lidocaine 1-2%  Position: Prone Prepped Area: Entire Posterolateral hip area. Prepping solution: DuraPrep (Iodine Povacrylex [0.7% available iodine] and Isopropyl Alcohol, 74% w/w)   Indications: 1. Chronic hip pain (Bilateral)   2. Osteoarthritis of hip (Bilateral)   3. Chronic anticoagulation (Eliquis)    Pain Score: Pre-procedure: 8 /10 Post-procedure: 0-No pain/10  Pre-op Assessment:  Kristin [name] is a 85 y.o. (year old), female patient, seen today for interventional treatment. She  has a past surgical history that includes Abdominal hysterectomy; Back surgery; Rotator cuff repair; Breast surgery; Cataract extraction w/PHACO (10/19/2011); Cataract extraction w/PHACO (11/02/2011); Carpal tunnel release; Joint replacement; Colon surgery; Pacemaker insertion (N/A, 12/24/2015); Cholecystectomy;  Breast lumpectomy (Right, 2004); and Insert / replace / remove pacemaker. Kristin [name] has a current medication list which includes the following prescription(s): albuterol, apixaban, bisacodyl, vitamin d, furosemide, levocetirizine, lisinopril, melatonin, oxycodone hcl, oxycodone hcl, oxycodone hcl, oxygen-helium, polyethylene glycol, and ropinirole, and the following Facility-Administered Medications: fentanyl and midazolam. Her primarily concern today is the Hip Pain (bilateral )  Initial Vital Signs:  Pulse/HCG Rate: 64ECG Heart Rate: 66 Temp: 97.9 F (36.6 C) Resp: 16 BP: (!) 145/79 SpO2: 100 %  BMI: Estimated body mass index is 24.37 kg/m as calculated from the following:   Height as of this encounter: 5\' 9"  (1.753 m).   Weight as of this encounter: 165 lb (74.8 kg).  Risk Assessment: Allergies: Reviewed. She is allergic to flexeril [cyclobenzaprine hcl] and vancomycin.  Allergy Precautions: None required Coagulopathies: Reviewed. None identified.  Blood-thinner therapy: None at this time Active Infection(s): Reviewed. None identified. Kristin [name] is afebrile  Site Confirmation: Kristin [name] was asked to confirm the procedure and laterality before marking the site Procedure checklist: Completed Consent: Before the procedure and under the influence of no sedative(s), amnesic(s), or anxiolytics, the patient was informed of the treatment options, risks and possible complications. To fulfill our ethical and legal obligations, as recommended by the American Medical Association's Code of Ethics, I have informed the patient of my clinical impression; the nature and purpose of the treatment or procedure; the risks, benefits, and possible complications of the intervention; the alternatives, including doing nothing; the risk(s) and benefit(s) of the alternative treatment(s) or procedure(s); and the risk(s) and benefit(s) of doing nothing. The patient was provided information about the general risks  and possible complications associated with the procedure. These may include, but are not limited to: failure to achieve desired goals, infection, bleeding, organ or nerve damage, allergic reactions,  paralysis, and death. In addition, the patient was informed of those risks and complications associated to the procedure, such as failure to decrease pain; infection; bleeding; organ or nerve damage with subsequent damage to sensory, motor, and/or autonomic systems, resulting in permanent pain, numbness, and/or weakness of one or several areas of the body; allergic reactions; (i.e.: anaphylactic reaction); and/or death. Furthermore, the patient was informed of those risks and complications associated with the medications. These include, but are not limited to: allergic reactions (i.e.: anaphylactic or anaphylactoid reaction(s)); adrenal axis suppression; blood sugar elevation that in diabetics may result in ketoacidosis or comma; water retention that in patients with history of congestive heart failure may result in shortness of breath, pulmonary edema, and decompensation with resultant heart failure; weight gain; swelling or edema; medication-induced neural toxicity; particulate matter embolism and blood vessel occlusion with resultant organ, and/or nervous system infarction; and/or aseptic necrosis of one or more joints. Finally, the patient was informed that Medicine is not an exact science; therefore, there is also the possibility of unforeseen or unpredictable risks and/or possible complications that may result in a catastrophic outcome. The patient indicated having understood very clearly. We have given the patient no guarantees and we have made no promises. Enough time was given to the patient to ask questions, all of which were answered to the patient's satisfaction. Kristin [name] has indicated that she wanted to continue with the procedure. Attestation: I, the ordering provider, attest that I have discussed  with the patient the benefits, risks, side-effects, alternatives, likelihood of achieving goals, and potential problems during recovery for the procedure that I have provided informed consent. Date  Time: 02/08/2019 10:01 AM  Pre-Procedure Preparation:  Monitoring: As per clinic protocol. Respiration, ETCO2, SpO2, BP, heart rate and rhythm monitor placed and checked for adequate function Safety Precautions: Patient was assessed for positional comfort and pressure points before starting the procedure. Time-out: I initiated and conducted the "Time-out" before starting the procedure, as per protocol. The patient was asked to participate by confirming the accuracy of the "Time Out" information. Verification of the correct person, site, and procedure were performed and confirmed by me, the nursing staff, and the patient. "Time-out" conducted as per Joint Commission's Universal Protocol (UP.01.01.01). Time: 1030  Description of Procedure:          Safety Precautions: Aspiration looking for blood return was conducted prior to all injections. At no point did we inject any substances, as a needle was being advanced. No attempts were made at seeking any paresthesias. Safe injection practices and needle disposal techniques used. Medications properly checked for expiration dates. SDV (single dose vial) medications used. Description of the Procedure: Protocol guidelines were followed. The patient was placed in position over the fluoroscopy table. The target area was identified and the area prepped in the usual manner. Skin & deeper tissues infiltrated with local anesthetic. Appropriate amount of time allowed to pass for local anesthetics to take effect. The procedure needles were then advanced to the target area. Proper needle placement secured. Negative aspiration confirmed. Solution injected in intermittent fashion, asking for systemic symptoms every 0.5cc of injectate. The needles were then removed and the area  cleansed, making sure to leave some of the prepping solution back to take advantage of its long term bactericidal properties. Vitals:   02/08/19 1046 02/08/19 1056 02/08/19 1106 02/08/19 1117  BP: 136/78 (!) 149/84 (!) 157/102 (!) 148/87  Pulse:      Resp: 12 13 16 16   Temp:  Marland Kitchen)  96 F (35.6 C)  (!) 96.1 F (35.6 C)  TempSrc:      SpO2: 98% 100% 98% 98%  Weight:      Height:        Start Time: 1030 hrs. End Time: 1046 hrs. Materials:  Needle(s) Type: Spinal Needle Gauge: 22G Length: 5.0-in Medication(s): Please see orders for medications and dosing details.  Imaging Guidance (Non-Spinal):          Type of Imaging Technique: Fluoroscopy Guidance (Non-Spinal) Indication(s): Assistance in needle guidance and placement for procedures requiring needle placement in or near specific anatomical locations not easily accessible without such assistance. Exposure Time: Please see nurses notes. Contrast: Before injecting any contrast, we confirmed that the patient did not have an allergy to iodine, shellfish, or radiological contrast. Once satisfactory needle placement was completed at the desired level, radiological contrast was injected. Contrast injected under live fluoroscopy. No contrast complications. See chart for type and volume of contrast used. Fluoroscopic Guidance: I was personally present during the use of fluoroscopy. "Tunnel Vision Technique" used to obtain the best possible view of the target area. Parallax error corrected before commencing the procedure. "Direction-depth-direction" technique used to introduce the needle under continuous pulsed fluoroscopy. Once target was reached, antero-posterior, oblique, and lateral fluoroscopic projection used confirm needle placement in all planes. Images permanently stored in EMR. Interpretation: I personally interpreted the imaging intraoperatively. Adequate needle placement confirmed in multiple planes. Appropriate spread of contrast into  desired area was observed. No evidence of afferent or efferent intravascular uptake. Permanent images saved into the patient's record.  Antibiotic Prophylaxis:   Anti-infectives (From admission, onward)   None     Indication(s): None identified  Post-operative Assessment:  Post-procedure Vital Signs:  Pulse/HCG Rate: 6462 Temp: (!) 96.1 F (35.6 C) Resp: 16 BP: (!) 148/87 SpO2: 98 %  EBL: None  Complications: No immediate post-treatment complications observed by team, or reported by patient.  Note: The patient tolerated the entire procedure well. A repeat set of vitals were taken after the procedure and the patient was kept under observation following institutional policy, for this type of procedure. Post-procedural neurological assessment was performed, showing return to baseline, prior to discharge. The patient was provided with post-procedure discharge instructions, including a section on how to identify potential problems. Should any problems arise concerning this procedure, the patient was given instructions to immediately contact us, at any time, without hesitation. In any case, we plan to contact the patient by telephone for a follow-up status report regarding this interventional procedure.  Comments:  No additional relevant information.  Plan of Care  Orders:  Orders Placed This Encounter  Procedures  . HIP INJECTION    Scheduling Instructions:     Side: Bilateral     Sedation: With Sedation.     Timeframe: Today  . DG PAIN CLIN[facility] C-ARM 1-60 MIN NO REPORT    Intraoperative interpretation by procedural physician at [facility].    Standing Status:   Standing    Number of Occurrences:   1    Order Specific Question:   Reason for exam:    Answer:   Assistance in needle guidance and placement for procedures requiring needle placement in or near specific anatomical locations not easily accessible without such assistance.  . Care order/instruction: Please  confirm that the patient has stopped the Eliquis (Apixaban) x 3 days prior to procedure or surgery.    Please confirm that the patient has stopped the Eliquis (Apixaban) x 3 days  prior to procedure or surgery.    Standing Status:   Standing    Number of Occurrences:   1  . Provider attestation of informed consent for procedure/surgical case    I, the ordering provider, attest that I have discussed with the patient the benefits, risks, side effects, alternatives, likelihood of achieving goals and potential problems during recovery for the procedure that I have provided informed consent.    Standing Status:   Standing    Number of Occurrences:   1  . Informed Consent Details: Transcribe to consent form and obtain patient signature    Consent Attestation: I, the ordering provider, attest that I have discussed with the patient the benefits, risks, side-effects, alternatives, likelihood of achieving goals, and potential problems during recovery for the procedure that I have provided informed consent.    Standing Status:   Standing    Number of Occurrences:   1    Order Specific Question:   Procedure    Answer:   Bilateral intra-articular hip joint injection under fluoroscopic guidance    Order Specific Question:   Surgeon    Answer:   Sabryna Lahm A. Dossie Arbour, MD    Order Specific Question:   Indication/Reason    Answer:   Bilateral hip joint pain secondary to osteoarthritis of the hip (Bilateral)   Medications ordered for procedure: Meds ordered this encounter  Medications  . iohexol (OMNIPAQUE) 180 MG/ML injection 10 mL    Must be Myelogram-compatible. If not available, you may substitute with a water-soluble, non-ionic, hypoallergenic, myelogram-compatible radiological contrast medium.  Marland Kitchen lidocaine (XYLOCAINE) 2 % (with pres) injection 400 mg  . lactated ringers infusion 1,000 mL  . midazolam (VERSED) 5 MG/5ML injection 1-2 mg    Make sure Flumazenil is available in the pyxis when using this  medication. If oversedation occurs, administer 0.2 mg IV over 15 sec. If after 45 sec no response, administer 0.2 mg again over 1 min; may repeat at 1 min intervals; not to exceed 4 doses (1 mg)  . fentaNYL (SUBLIMAZE) injection 25-50 mcg    Make sure Narcan is available in the pyxis when using this medication. In the event of respiratory depression (RR< 8/min): Titrate NARCAN (naloxone) in increments of 0.1 to 0.2 mg IV at 2-3 minute intervals, until desired degree of reversal.  . ropivacaine (PF) 2 mg/mL (0.2%) (NAROPIN) injection 9 mL  . methylPREDNISolone acetate (DEPO-MEDROL) injection 80 mg   Medications administered: We administered iohexol, lidocaine, lactated ringers, ropivacaine (PF) 2 mg/mL (0.2%), and methylPREDNISolone acetate.  See the medical record for exact dosing, route, and time of administration.  Disposition: Discharge home  Discharge Date & Time: 02/08/2019; 1118 hrs.   Follow-up plan:   Return in about 2 weeks (around 02/22/2019) for (VV), E/M (PP).     Future Appointments  Date Time Provider [place]  02/09/2019  1:00 PM [facility] 1 [facility]  02/09/2019  2:00 PM [facility]-MR 1 [facility]  03/12/2019  9:15 AM Milinda Pointer, MD [facility] None  04/16/2019 11:30 AM Milinda Pointer, MD [facility] None  05/08/2019 10:00 AM Mikey College, NP [facility] None  05/08/2019 10:45 AM [place] ADVISOR [facility] None   Primary Care Physician: Mikey College, NP Location: [facility] Note by: Gaspar Cola, MD Date: 02/08/2019; Time: 12:28 PM  Disclaimer:  Medicine is not an Chief Strategy Officer. The only guarantee in medicine is that nothing is guaranteed. It is important to note that the decision to proceed  with this intervention was based on the information collected from the patient. The Data and conclusions were drawn from the patient's questionnaire, the interview, and the physical examination. Because the information was  provided in large part by the patient, it cannot be guaranteed that it has not been purposely or unconsciously manipulated. Every effort has been made to obtain as much relevant data as possible for this evaluation. It is important to note that the conclusions that lead to this procedure are derived in large part from the available data. Always take into account that the treatment will also be dependent on availability of resources and existing treatment guidelines, considered by other Pain Management Practitioners as being common knowledge and practice, at the time of the intervention. For Medico-Legal purposes, it is also important to point out that variation in procedural techniques and pharmacological choices are the acceptable norm. The indications, contraindications, technique, and results of the above procedure should only be interpreted and judged by a Board-Certified Interventional Pain Specialist with extensive familiarity and expertise in the same exact procedure and technique.

## 2019-02-07 NOTE — Patient Instructions (Signed)

## 2019-02-08 ENCOUNTER — Other Ambulatory Visit: Payer: Self-pay

## 2019-02-08 ENCOUNTER — Ambulatory Visit
Admission: RE | Admit: 2019-02-08 | Discharge: 2019-02-08 | Disposition: A | Discharge status: Home / Self Care | Payer: Medicare Other | Source: Ambulatory Visit | Attending: Pain Medicine | Admitting: Pain Medicine

## 2019-02-08 ENCOUNTER — Encounter: Payer: Self-pay | Admitting: Pain Medicine

## 2019-02-08 ENCOUNTER — Ambulatory Visit (HOSPITAL_BASED_OUTPATIENT_CLINIC_OR_DEPARTMENT_OTHER): Payer: Medicare Other | Admitting: Pain Medicine

## 2019-02-08 VITALS — BP 148/87 | HR 64 | Temp 96.1°F | Resp 16 | Ht 69.0 in | Wt 165.0 lb

## 2019-02-08 DIAGNOSIS — M25559 Pain in unspecified hip: Secondary | ICD-10-CM

## 2019-02-08 DIAGNOSIS — G8929 Other chronic pain: Secondary | ICD-10-CM | POA: Diagnosis present

## 2019-02-08 DIAGNOSIS — M160 Bilateral primary osteoarthritis of hip: Secondary | ICD-10-CM

## 2019-02-08 DIAGNOSIS — Z7901 Long term (current) use of anticoagulants: Secondary | ICD-10-CM

## 2019-02-08 MED ORDER — LACTATED RINGERS IV SOLN
1000.0000 mL | Freq: Once | INTRAVENOUS | Status: AC
  Administered 2019-02-08: 1000 mL via INTRAVENOUS

## 2019-02-08 MED ORDER — ROPIVACAINE HCL 2 MG/ML IJ SOLN
9.0000 mL | Freq: Once | INTRAMUSCULAR | Status: AC
  Administered 2019-02-08: 9 mL via INTRA_ARTICULAR
  Filled 2019-02-08: qty 10

## 2019-02-08 MED ORDER — IOHEXOL 180 MG/ML  SOLN
10.0000 mL | Freq: Once | INTRAMUSCULAR | Status: AC
  Administered 2019-02-08: 10 mL via INTRA_ARTICULAR

## 2019-02-08 MED ORDER — MIDAZOLAM HCL 5 MG/5ML IJ SOLN
1.0000 mg | INTRAMUSCULAR | Status: DC | PRN
  Filled 2019-02-08: qty 5

## 2019-02-08 MED ORDER — METHYLPREDNISOLONE ACETATE 80 MG/ML IJ SUSP
80.0000 mg | Freq: Once | INTRAMUSCULAR | Status: AC
  Administered 2019-02-08: 80 mg via INTRA_ARTICULAR
  Filled 2019-02-08: qty 1

## 2019-02-08 MED ORDER — FENTANYL CITRATE (PF) 100 MCG/2ML IJ SOLN
25.0000 ug | INTRAMUSCULAR | Status: DC | PRN
  Filled 2019-02-08: qty 2

## 2019-02-08 MED ORDER — LIDOCAINE HCL 2 % IJ SOLN
20.0000 mL | Freq: Once | INTRAMUSCULAR | Status: AC
  Administered 2019-02-08: 400 mg
  Filled 2019-02-08: qty 40

## 2019-02-09 ENCOUNTER — Ambulatory Visit (HOSPITAL_COMMUNITY): Payer: Medicare Other

## 2019-02-09 ENCOUNTER — Telehealth: Payer: Self-pay

## 2019-02-09 ENCOUNTER — Ambulatory Visit (HOSPITAL_COMMUNITY)
Admission: RE | Admit: 2019-02-09 | Discharge: 2019-02-09 | Disposition: A | Discharge status: Home / Self Care | Payer: Medicare Other | Source: Ambulatory Visit | Attending: Pain Medicine | Admitting: Pain Medicine

## 2019-02-09 ENCOUNTER — Other Ambulatory Visit: Payer: Self-pay

## 2019-02-09 DIAGNOSIS — M160 Bilateral primary osteoarthritis of hip: Secondary | ICD-10-CM

## 2019-02-09 DIAGNOSIS — G8929 Other chronic pain: Secondary | ICD-10-CM

## 2019-02-09 DIAGNOSIS — M25559 Pain in unspecified hip: Secondary | ICD-10-CM | POA: Diagnosis not present

## 2019-02-09 NOTE — Telephone Encounter (Signed)
Post procedure phone call. Patient states she is doing good.  

## 2019-02-12 ENCOUNTER — Other Ambulatory Visit: Payer: Self-pay

## 2019-03-02 ENCOUNTER — Other Ambulatory Visit: Payer: Self-pay | Admitting: Nurse Practitioner

## 2019-03-02 DIAGNOSIS — I10 Essential (primary) hypertension: Secondary | ICD-10-CM

## 2019-03-08 ENCOUNTER — Encounter: Payer: Self-pay | Admitting: Pain Medicine

## 2019-03-08 ENCOUNTER — Telehealth: Payer: Self-pay | Admitting: *Deleted

## 2019-03-08 NOTE — Telephone Encounter (Signed)
Called patient to get pre virtual visit information. No answer. LVM for her to call us back.

## 2019-03-11 NOTE — Progress Notes (Signed)
Pain Management Virtual Encounter Note - Virtual Visit via Telephone Telehealth (real-time audio visits between healthcare provider and patient).   Patient's Phone No. & Preferred Pharmacy:  [phone] (home); There is no such number on file (mobile).; (Preferred) [phone] [email or web address]  [facility] [place] [address] [place] [address] Phone: [phone] Fax: [phone]  [id number] - [place], [place] [address] [address] Phone: [phone] Fax: [phone]    Pre-screening note:  Our staff contacted Kristin [name] and offered her an "in person", "face-to-face" appointment versus a telephone encounter. She indicated preferring the telephone encounter, at this time.   Reason for Virtual Visit: COVID-19*  Social distancing based on CDC and AMA recommendations.   I contacted Kristin [name] on 03/12/2019 via telephone.      I clearly identified myself as Gaspar Cola, MD. I verified that I was speaking with the correct person using two identifiers (Name: Kristin [name], and date of birth: 1929-12-17).  Advanced Informed Consent I sought verbal advanced consent from Kristin [name] for virtual visit interactions. I informed Kristin [name] of possible security and privacy concerns, risks, and limitations associated with providing "not-in-person" medical evaluation and management services. I also informed Kristin [name] of the availability of "in-person" appointments. Finally, I informed her that there would be a charge for the virtual visit and that she could be  personally, fully or partially, financially responsible for it. Kristin [name] expressed understanding and agreed to proceed.   Historic Elements   Kristin [name] is a 85 y.o. year old, female patient evaluated today after her last encounter by our practice on 03/08/2019. Kristin [name]  has a past medical history of Acute postoperative pain  (02/09/2018), Arrhythmia, sinus node (04/03/2014), Arthritis, Arthritis, Atrial fibrillation ([place]), Back pain, Bradycardia, Breast cancer ([place]) (2004), Cancer ([place]) (2004), CHF (congestive heart failure) ([place]), Chronic back pain, CKD (chronic kidney disease), COPD (chronic obstructive pulmonary disease) ([place]), Cystocele, Decubitus ulcers, Dehydration, Gout, Hematuria, Hepatitis C, Hyperlipidemia, Hypertension, Hypomagnesemia (06/25/2015), OAB (overactive bladder), Overactive bladder, Restless leg, Shoulder pain, bilateral, Urinary frequency, UTI (lower urinary tract infection), and Vaginal atrophy. She also  has a past surgical history that includes Abdominal hysterectomy; Back surgery; Rotator cuff repair; Breast surgery; Cataract extraction w/PHACO (10/19/2011); Cataract extraction w/PHACO (11/02/2011); Carpal tunnel release; Joint replacement; Colon surgery; Pacemaker insertion (N/A, 12/24/2015); Cholecystectomy; Breast lumpectomy (Right, 2004); and Insert / replace / remove pacemaker. Kristin [name] has a current medication list which includes the following prescription(s): acetaminophen, albuterol, apixaban, bisacodyl, vitamin d, furosemide, levocetirizine, lisinopril, melatonin, oxycodone hcl, oxycodone hcl, oxygen-helium, polyethylene glycol, ropinirole, and oxycodone hcl. She  reports that she quit smoking about 20 years ago. Her smoking use included cigarettes. She has a 50.00 pack-year smoking history. She has never used smokeless tobacco. She reports that she does not drink alcohol or use drugs. Kristin [name] is allergic to flexeril [cyclobenzaprine hcl] and vancomycin.   HPI  Today, she is being contacted for a post-procedure assessment.  The patient indicates having attained only 80% relief of the pain on the left hip, which lasted only for 1 day.  However, in terms of the right hip, she got 100% relief of the pain that has continued and she currently has no right hip pain.  Today we went over the results of her  hip MRIs.  The patient appears to still be having some pain and the trochanteric bursa area of  the left hip.  I will be bringing her in for a diagnostic trochanteric bursa injection.  Post-Procedure Evaluation  Procedure: Diagnostic bilateral intra-articular hip jint inj. #2 under fluoroscopic guidance and IV sedation Pre-procedure pain level:  8/10 Post-procedure: 0/10 (100% relief)  Sedation: Sedation provided.  Effectiveness during initial hour after procedure(Ultra-Short Term Relief): 80 %, 100% on the right side  Local anesthetic used: Long-acting (4-6 hours) Effectiveness: Defined as any analgesic benefit obtained secondary to the administration of local anesthetics. This carries significant diagnostic value as to the etiological location, or anatomical origin, of the pain. Duration of benefit is expected to coincide with the duration of the local anesthetic used.  Effectiveness during initial 4-6 hours after procedure(Short-Term Relief): 80 % on the left and 100% on the right.  Long-term benefit: Defined as any relief past the pharmacologic duration of the local anesthetics.  Effectiveness past the initial 6 hours after procedure(Long-Term Relief): 0 % on the left and 100% on the right.  Current benefits: Defined as benefit that persist at this time.   Analgesia:  No benefit on the left side but 100% relief on the right. Function: Back to baseline on the left, but increase level of function on the right. ROM: Back to baseline on the left, but increase range of motion on the right.  Pharmacotherapy Assessment  Analgesic: Oxycodone IR 20 mg, 1 tab PO q 8 hrs (60 mg/day of oxycodone)(Has enough to last until 05/03/2019) MME/day: 90 mg/day.   Monitoring: Pharmacotherapy: No side-effects or adverse reactions reported. [place] PMP: PDMP reviewed during this encounter.       Compliance: No problems identified. Effectiveness: Clinically acceptable. Plan: Refer to "POC".  Pertinent Labs    SAFETY SCREENING Profile Lab Results  Component Value Date   SARSCOV2NAA NOT DETECTED 02/05/2019   COVIDSOURCE NASOPHARYNGEAL 02/05/2019   STAPHAUREUS NEGATIVE 12/16/2015   MRSAPCR NEGATIVE 12/16/2015   Renal Function Lab Results  Component Value Date   BUN 29 (H) 07/25/2018   CREATININE 1.49 (H) 07/25/2018   BCR 19 07/25/2018   GFRAA 36 (L) 07/25/2018   GFRNONAA 31 (L) 07/25/2018   Hepatic Function Lab Results  Component Value Date   AST 21 07/25/2018   ALT 11 07/25/2018   ALBUMIN 3.6 08/22/2017   UDS Summary  Date Value Ref Range Status  02/01/2018 FINAL  Final    Comment:    ==================================================================== TOXASSURE SELECT 13 (MW) ==================================================================== Test                             Result       Flag       Units Drug Present and Declared for Prescription Verification   Oxycodone                      401          EXPECTED   ng/mg creat   Oxymorphone                    438          EXPECTED   ng/mg creat   Noroxycodone                   1122         EXPECTED   ng/mg creat   Noroxymorphone                 309  EXPECTED   ng/mg creat    Sources of oxycodone are scheduled prescription medications.    Oxymorphone, noroxycodone, and noroxymorphone are expected    metabolites of oxycodone. Oxymorphone is also available as a    scheduled prescription medication. ==================================================================== Test                      Result    Flag   Units      Ref Range   Creatinine              79               mg/dL      >=20 ==================================================================== Declared Medications:  The flagging and interpretation on this report are based on the  following declared medications.  Unexpected results may arise from  inaccuracies in the declared medications.  **Note: The testing scope of this panel includes these  medications:  Oxycodone  **Note: The testing scope of this panel does not include following  reported medications:  Albuterol (Proventil)  Apixaban  Bisacodyl  Furosemide (Lasix)  Lisinopril  Melatonin  Polyethylene Glycol (MiraLAX)  Ropinirole  Vitamin D ==================================================================== For clinical consultation, please call [phone]. ====================================================================    Note: Above Lab results reviewed.  Recent imaging  DG PAIN CLINIC C-ARM 1-60 MIN NO REPORT Fluoro was used, but no Radiologist interpretation will be provided.  Please refer to "NOTES" tab for provider progress note.  Assessment  The primary encounter diagnosis was Greater trochanteric bursitis of hip (Left). Diagnoses of Chronic hip pain (Bilateral) and Osteoarthritis of hip (Bilateral) were also pertinent to this visit.  Plan of Care  I am having Kristin [name] maintain her apixaban, OXYGEN, Vitamin D, furosemide, levocetirizine, Oxycodone HCl, Oxycodone HCl, Oxycodone HCl, Melatonin, polyethylene glycol, rOPINIRole, bisacodyl, albuterol, lisinopril, and acetaminophen.  Pharmacotherapy (Medications Ordered): No orders of the defined types were placed in this encounter.  Orders:  Orders Placed This Encounter  Procedures  . HIP INJECTION    Standing Status:   Future    Standing Expiration Date:   06/12/2019    Scheduling Instructions:     Purpose: Diagnostic     Indication: Hip pain 2ry to Trochanteric Burlitis left (M70.62).     Side: Left-sided     Timeframe: As soon as the schedule permits.   Follow-up plan:   Return in about 7 weeks (around 05/02/2019) for (VV), E/M (MM), in addition, Procedure (no sedation): (L) Trochanteric bursa inj..      Interventional management options:  Considering:   NOTE: ELIQUIS ANTICOAGULATION (Stop: x3 days prior to procedure  Restart 6 hours after procedures) Diagnostic left-sided  trochanteric bursa injection #1  Diagnostic bilateral sacroiliac joint block Possible bilateral sacroiliac joint RFA Diagnostic bilateral intra-articular hip joint injection #2 Diagnostic bilateral femoral nerve + obturator nerve block Possible bilateral femoral nerve + obturator nerve RFA Diagnostic right intra-articular shoulder joint injection   Palliative PRN treatment(s):   Palliative left CESI #3  Palliative bilateral intra-articular hip jint inj. #2  (0% on the left but 100% on the right) Palliative bilateral lumbar facet block Palliative left lumbar facet RFA #4 (Last done on 08/01/18) Palliative left lumbar facet RFA #2 (Last done on 09/19/18) Diagnostic right intra-articular shoulder joint injection  Palliative bilateral suprascapular nerve block Palliative bilateral suprascapular nerve RFA #2(Last done 07/13/18)    Recent Visits Date Type Provider Dept  02/08/19 Procedure visit Milinda Pointer, [facility]  01/22/19 Office Visit Milinda Pointer,  MD [facility]  Showing recent visits within past 90 days and meeting all other requirements   Today's Visits Date Type Provider Dept  03/12/19 Office Visit Milinda Pointer, MD [facility]  Showing today's visits and meeting all other requirements   Future Appointments Date Type Provider Dept  04/12/19 Appointment Milinda Pointer, MD [facility]  Showing future appointments within next 90 days and meeting all other requirements   I discussed the assessment and treatment plan with the patient. The patient was provided an opportunity to ask questions and all were answered. The patient agreed with the plan and demonstrated an understanding of the instructions.  Patient advised to call back or seek an in-person evaluation if the symptoms or condition worsens.  Total duration of non-face-to-face encounter: 15 minutes.  Note by: Gaspar Cola, MD Date:  03/12/2019; Time: 10:10 AM  Note: This dictation was prepared with Dragon dictation. Any transcriptional errors that may result from this process are unintentional.  Disclaimer:  * Given the special circumstances of the COVID-19 pandemic, the federal government has announced that the Office for Civil Rights (OCR) will exercise its enforcement discretion and will not impose penalties on physicians using telehealth in the event of noncompliance with regulatory requirements under the [place] and [place] (HIPAA) in connection with the good faith provision of telehealth during the ESLPN-30 national public health emergency. ([place])

## 2019-03-12 ENCOUNTER — Other Ambulatory Visit: Payer: Self-pay

## 2019-03-12 ENCOUNTER — Ambulatory Visit: Payer: Medicare Other | Attending: Pain Medicine | Admitting: Pain Medicine

## 2019-03-12 DIAGNOSIS — M7062 Trochanteric bursitis, left hip: Secondary | ICD-10-CM | POA: Diagnosis not present

## 2019-03-12 DIAGNOSIS — M25559 Pain in unspecified hip: Secondary | ICD-10-CM

## 2019-03-12 DIAGNOSIS — M160 Bilateral primary osteoarthritis of hip: Secondary | ICD-10-CM | POA: Diagnosis not present

## 2019-03-12 DIAGNOSIS — G8929 Other chronic pain: Secondary | ICD-10-CM

## 2019-03-12 NOTE — Patient Instructions (Signed)
____________________________________________________________________________________________  Preparing for your procedure (without sedation)  Procedure appointments are limited to planned procedures: . No Prescription Refills. . No disability issues will be discussed. . No medication changes will be discussed.  Instructions: . Oral Intake: Do not eat or drink anything for at least 3 hours prior to your procedure. . Transportation: Unless otherwise stated by your physician, you may drive yourself after the procedure. . Blood Pressure Medicine: Take your blood pressure medicine with a sip of water the morning of the procedure. . Blood thinners: Notify our staff if you are taking any blood thinners. Depending on which one you take, there will be specific instructions on how and when to stop it. . Diabetics on insulin: Notify the staff so that you can be scheduled 1st case in the morning. If your diabetes requires high dose insulin, take only  of your normal insulin dose the morning of the procedure and notify the staff that you have done so. . Preventing infections: Shower with an antibacterial soap the morning of your procedure.  . Build-up your immune system: Take 1000 mg of Vitamin C with every meal (3 times a day) the day prior to your procedure. [place] Kitchen Antibiotics: Inform the staff if you have a condition or reason that requires you to take antibiotics before dental procedures. . Pregnancy: If you are pregnant, call and cancel the procedure. . Sickness: If you have a cold, fever, or any active infections, call and cancel the procedure. . Arrival: You must be in the facility at least 30 minutes prior to your scheduled procedure. . Children: Do not bring any children with you. . Dress appropriately: Bring dark clothing that you would not mind if they get stained. . Valuables: Do not bring any jewelry or valuables.  Reasons to call and reschedule or cancel your procedure: (Following these  recommendations will minimize the risk of a serious complication.) . Surgeries: Avoid having procedures within 2 weeks of any surgery. (Avoid for 2 weeks before or after any surgery). . Flu Shots: Avoid having procedures within 2 weeks of a flu shots or . (Avoid for 2 weeks before or after immunizations). . Barium: Avoid having a procedure within 7-10 days after having had a radiological study involving the use of radiological contrast. (Myelograms, Barium swallow or enema study). . Heart attacks: Avoid any elective procedures or surgeries for the initial 6 months after a "Myocardial Infarction" (Heart Attack). . Blood thinners: It is imperative that you stop these medications before procedures. Let us know if you if you take any blood thinner.  . Infection: Avoid procedures during or within two weeks of an infection (including chest colds or gastrointestinal problems). Symptoms associated with infections include: Localized redness, fever, chills, night sweats or profuse sweating, burning sensation when voiding, cough, congestion, stuffiness, runny nose, sore throat, diarrhea, nausea, vomiting, cold or Flu symptoms, recent or current infections. It is specially important if the infection is over the area that we intend to treat. Marland Kitchen Heart and lung problems: Symptoms that may suggest an active cardiopulmonary problem include: cough, chest pain, breathing difficulties or shortness of breath, dizziness, ankle swelling, uncontrolled high or unusually low blood pressure, and/or palpitations. If you are experiencing any of these symptoms, cancel your procedure and contact your primary care physician for an evaluation.  Remember:  Regular Business hours are:  Monday to Thursday 8:00 AM to 4:00 PM  Provider's Schedule: Milinda Pointer, MD:  Procedure days: Tuesday and Thursday 7:30 AM to 4:00 PM  [place]  Holley Raring, MD:  Procedure days: Monday and Wednesday 7:30 AM to 4:00  PM ____________________________________________________________________________________________   ____________________________________________________________________________________________  CANNABIDIOL (AKA: CBD Oil or Pills)  Applies to: All patients receiving prescriptions of controlled substances (written and/or electronic).  General Information: Cannabidiol (CBD) was discovered in 73. It is one of some 113 identified cannabinoids in cannabis (Marijuana) plants, accounting for up to 40% of the plant's extract. As of 2018, preliminary clinical research on cannabidiol included studies of anxiety, cognition, movement disorders, and pain.  Cannabidiol is consummed in multiple ways, including inhalation of cannabis smoke or vapor, as an aerosol spray into the cheek, and by mouth. It may be supplied as CBD oil containing CBD as the active ingredient (no added tetrahydrocannabinol (THC) or terpenes), a full-plant CBD-dominant hemp extract oil, capsules, dried cannabis, or as a liquid solution. CBD is thought not have the same psychoactivity as THC, and may affect the actions of THC. Studies suggest that CBD may interact with different biological targets, including cannabinoid receptors and other neurotransmitter receptors. As of 2018 the mechanism of action for its biological effects has not been determined.  In the [place], cannabidiol has a limited approval by the Food and Drug Administration (FDA) for treatment of only two types of epilepsy disorders. The side effects of long-term use of the drug include somnolence, decreased appetite, diarrhea, fatigue, malaise, weakness, sleeping problems, and others.  CBD remains a Schedule I drug prohibited for any use.  Legality: Some manufacturers ship CBD products nationally, an illegal action which the FDA has not enforced in 2018, with CBD remaining the subject of an FDA investigational new drug evaluation, and is not considered legal as a dietary  supplement or food ingredient as of December 2018. Federal illegality has made it difficult historically to conduct research on CBD. CBD is openly sold in head shops and health food stores in some states where such sales have not been explicitly legalized.  Warning: Because it is not FDA approved for general use or treatment of pain, it is not required to undergo the same manufacturing controls as prescription drugs.  This means that the available cannabidiol (CBD) may be contaminated with THC.  If this is the case, it will trigger a positive urine drug screen (UDS) test for cannabinoids (Marijuana).  Because a positive UDS for illicit substances is a violation of our medication agreement, your opioid analgesics (pain medicine) may be permanently discontinued. (Last update: 11/10/2017) ____________________________________________________________________________________________

## 2019-03-27 ENCOUNTER — Ambulatory Visit (HOSPITAL_BASED_OUTPATIENT_CLINIC_OR_DEPARTMENT_OTHER): Payer: Medicare Other | Admitting: Pain Medicine

## 2019-03-27 ENCOUNTER — Encounter: Payer: Self-pay | Admitting: Pain Medicine

## 2019-03-27 ENCOUNTER — Other Ambulatory Visit: Payer: Self-pay

## 2019-03-27 ENCOUNTER — Ambulatory Visit
Admission: RE | Admit: 2019-03-27 | Discharge: 2019-03-27 | Disposition: A | Discharge status: Home / Self Care | Payer: Medicare Other | Source: Ambulatory Visit | Attending: Pain Medicine | Admitting: Pain Medicine

## 2019-03-27 VITALS — BP 175/95 | HR 62 | Temp 98.2°F | Resp 14 | Ht 68.0 in | Wt 165.0 lb

## 2019-03-27 DIAGNOSIS — Z7901 Long term (current) use of anticoagulants: Secondary | ICD-10-CM | POA: Diagnosis present

## 2019-03-27 DIAGNOSIS — G8929 Other chronic pain: Secondary | ICD-10-CM | POA: Insufficient documentation

## 2019-03-27 DIAGNOSIS — M7062 Trochanteric bursitis, left hip: Secondary | ICD-10-CM | POA: Diagnosis present

## 2019-03-27 DIAGNOSIS — M160 Bilateral primary osteoarthritis of hip: Secondary | ICD-10-CM | POA: Diagnosis present

## 2019-03-27 DIAGNOSIS — M25559 Pain in unspecified hip: Secondary | ICD-10-CM

## 2019-03-27 MED ORDER — ROPIVACAINE HCL 2 MG/ML IJ SOLN
4.0000 mL | Freq: Once | INTRAMUSCULAR | Status: AC
  Administered 2019-03-27: 4 mL via INTRA_ARTICULAR
  Filled 2019-03-27: qty 10

## 2019-03-27 MED ORDER — METHYLPREDNISOLONE ACETATE 80 MG/ML IJ SUSP
80.0000 mg | Freq: Once | INTRAMUSCULAR | Status: AC
  Administered 2019-03-27: 80 mg via INTRA_ARTICULAR
  Filled 2019-03-27: qty 1

## 2019-03-27 MED ORDER — LIDOCAINE HCL 2 % IJ SOLN
20.0000 mL | Freq: Once | INTRAMUSCULAR | Status: AC
  Administered 2019-03-27: 400 mg
  Filled 2019-03-27: qty 200

## 2019-03-27 NOTE — Patient Instructions (Signed)

## 2019-03-27 NOTE — Progress Notes (Signed)
Patient's Name: Kristin [name]  MRN: [id number]  Referring Provider: Mikey College, *  DOB: 1930-02-04  PCP: Mikey College, NP  DOS: 03/27/2019  Note by: Gaspar Cola, MD  Service setting: Ambulatory outpatient  Specialty: Interventional Pain Management  Patient type: Established  Location: [facility] (AMB) Pain Management Facility  Visit type: Interventional Procedure   Primary Reason for Visit: Interventional Pain Management Treatment. CC: Hip Pain  Procedure:          Anesthesia, Analgesia, Anxiolysis:  Type: Trochanteric Bursa Injection          Primary Purpose: Diagnostic/Therapeutric Region: Upper (proximal) Femoral Region Level: Hip Joint Target Area: Superior aspect of the hip joint cavity, going thru the superior portion of the capsular ligament. Approach: Posterolateral approach Laterality: Left  Type: Local Anesthesia Indication(s): Analgesia         Local Anesthetic: Lidocaine 1-2% Route: Infiltration (Stacy/IM) IV Access: Declined Sedation: Declined   Position: Lateral Decubitus with bad side up   Indications: 1. Chronic hip pain (Bilateral)   2. Greater trochanteric bursitis of hip (Left)   3. Osteoarthritis of hip (Bilateral)   4. Chronic anticoagulation (Eliquis)    Pain Score: Pre-procedure: 6 /10 Post-procedure: 0-No pain/10   Pre-op Assessment:  Kristin [name] is a 85 y.o. (year old), female patient, seen today for interventional treatment. She  has a past surgical history that includes Abdominal hysterectomy; Back surgery; Rotator cuff repair; Breast surgery; Cataract extraction w/PHACO (10/19/2011); Cataract extraction w/PHACO (11/02/2011); Carpal tunnel release; Joint replacement; Colon surgery; Pacemaker insertion (N/A, 12/24/2015); Cholecystectomy; Breast lumpectomy (Right, 2004); and Insert / replace / remove pacemaker. Kristin [name] has a current medication list which includes the following prescription(s): acetaminophen, albuterol, apixaban, bisacodyl,  vitamin d, furosemide, levocetirizine, lisinopril, melatonin, oxycodone hcl, oxycodone hcl, oxygen-helium, polyethylene glycol, ropinirole, and oxycodone hcl. Her primarily concern today is the Hip Pain  Initial Vital Signs:  Pulse/HCG Rate: 62ECG Heart Rate: 67 Temp: 98.2 F (36.8 C) Resp: 13 BP: (!) 150/81 SpO2: 96 %  BMI: Estimated body mass index is 25.09 kg/m as calculated from the following:   Height as of this encounter: 5\' 8"  (1.727 m).   Weight as of this encounter: 165 lb (74.8 kg).  Risk Assessment: Allergies: Reviewed. She is allergic to flexeril [cyclobenzaprine hcl] and vancomycin.  Allergy Precautions: None required Coagulopathies: Reviewed. None identified.  Blood-thinner therapy: None at this time Active Infection(s): Reviewed. None identified. Kristin [name] is afebrile  Site Confirmation: Kristin [name] was asked to confirm the procedure and laterality before marking the site Procedure checklist: Completed Consent: Before the procedure and under the influence of no sedative(s), amnesic(s), or anxiolytics, the patient was informed of the treatment options, risks and possible complications. To fulfill our ethical and legal obligations, as recommended by the American Medical Association's Code of Ethics, I have informed the patient of my clinical impression; the nature and purpose of the treatment or procedure; the risks, benefits, and possible complications of the intervention; the alternatives, including doing nothing; the risk(s) and benefit(s) of the alternative treatment(s) or procedure(s); and the risk(s) and benefit(s) of doing nothing. The patient was provided information about the general risks and possible complications associated with the procedure. These may include, but are not limited to: failure to achieve desired goals, infection, bleeding, organ or nerve damage, allergic reactions, paralysis, and death. In addition, the patient was informed of those risks and  complications associated to the procedure, such as failure to decrease pain; infection; bleeding; organ or  nerve damage with subsequent damage to sensory, motor, and/or autonomic systems, resulting in permanent pain, numbness, and/or weakness of one or several areas of the body; allergic reactions; (i.e.: anaphylactic reaction); and/or death. Furthermore, the patient was informed of those risks and complications associated with the medications. These include, but are not limited to: allergic reactions (i.e.: anaphylactic or anaphylactoid reaction(s)); adrenal axis suppression; blood sugar elevation that in diabetics may result in ketoacidosis or comma; water retention that in patients with history of congestive heart failure may result in shortness of breath, pulmonary edema, and decompensation with resultant heart failure; weight gain; swelling or edema; medication-induced neural toxicity; particulate matter embolism and blood vessel occlusion with resultant organ, and/or nervous system infarction; and/or aseptic necrosis of one or more joints. Finally, the patient was informed that Medicine is not an exact science; therefore, there is also the possibility of unforeseen or unpredictable risks and/or possible complications that may result in a catastrophic outcome. The patient indicated having understood very clearly. We have given the patient no guarantees and we have made no promises. Enough time was given to the patient to ask questions, all of which were answered to the patient's satisfaction. Kristin [name] has indicated that she wanted to continue with the procedure. Attestation: I, the ordering provider, attest that I have discussed with the patient the benefits, risks, side-effects, alternatives, likelihood of achieving goals, and potential problems during recovery for the procedure that I have provided informed consent. Date   Time: 03/27/2019 10:03 AM  Pre-Procedure Preparation:  Monitoring: As per clinic  protocol. Respiration, ETCO2, SpO2, BP, heart rate and rhythm monitor placed and checked for adequate function Safety Precautions: Patient was assessed for positional comfort and pressure points before starting the procedure. Time-out: I initiated and conducted the "Time-out" before starting the procedure, as per protocol. The patient was asked to participate by confirming the accuracy of the "Time Out" information. Verification of the correct person, site, and procedure were performed and confirmed by me, the nursing staff, and the patient. "Time-out" conducted as per Joint Commission's Universal Protocol (UP.01.01.01). Time: 1050  Description of Procedure:          Area Prepped: Entire Posterolateral hip area. Prepping solution: DuraPrep (Iodine Povacrylex [0.7% available iodine] and Isopropyl Alcohol, 74% w/w) Safety Precautions: Aspiration looking for blood return was conducted prior to all injections. At no point did we inject any substances, as a needle was being advanced. No attempts were made at seeking any paresthesias. Safe injection practices and needle disposal techniques used. Medications properly checked for expiration dates. SDV (single dose vial) medications used. Description of the Procedure: Protocol guidelines were followed. The patient was placed in position over the procedure table. The target area was identified and the area prepped in the usual manner. Skin & deeper tissues infiltrated with local anesthetic. Appropriate amount of time allowed to pass for local anesthetics to take effect. The procedure needles were then advanced to the target area. Proper needle placement secured. Negative aspiration confirmed. Solution injected in intermittent fashion, asking for systemic symptoms every 0.5cc of injectate. The needles were then removed and the area cleansed, making sure to leave some of the prepping solution back to take advantage of its long term bactericidal properties. Vitals:    03/27/19 1001 03/27/19 1036 03/27/19 1041 03/27/19 1055  BP: (!) 150/81 (!) 167/87 (!) 166/88 (!) 175/95  Pulse: 62     Resp:  13 12 14   Temp: 98.2 F (36.8 C)     SpO2:  96% 100% 98% 100%  Weight: 165 lb (74.8 kg)     Height: 5\' 8"  (1.727 m)       Start Time: 1050 hrs. End Time: 1054 hrs.  Materials:  Needle(s) Type: Spinal Needle Gauge: 22G Length: 5.0-in Medication(s): Please see orders for medications and dosing details.  Imaging Guidance (Non-Spinal):          Type of Imaging Technique: Fluoroscopy Guidance (Non-Spinal) Indication(s): Assistance in needle guidance and placement for procedures requiring needle placement in or near specific anatomical locations not easily accessible without such assistance. Exposure Time: Please see nurses notes. Contrast: Before injecting any contrast, we confirmed that the patient did not have an allergy to iodine, shellfish, or radiological contrast. Once satisfactory needle placement was completed at the desired level, radiological contrast was injected. Contrast injected under live fluoroscopy. No contrast complications. See chart for type and volume of contrast used. Fluoroscopic Guidance: I was personally present during the use of fluoroscopy. "Tunnel Vision Technique" used to obtain the best possible view of the target area. Parallax error corrected before commencing the procedure. "Direction-depth-direction" technique used to introduce the needle under continuous pulsed fluoroscopy. Once target was reached, antero-posterior, oblique, and lateral fluoroscopic projection used confirm needle placement in all planes. Images permanently stored in EMR. Interpretation: I personally interpreted the imaging intraoperatively. Adequate needle placement confirmed in multiple planes. Appropriate spread of contrast into desired area was observed. No evidence of afferent or efferent intravascular uptake. Permanent images saved into the patient's  record.  Antibiotic Prophylaxis:   Anti-infectives (From admission, onward)   None     Indication(s): None identified  Post-operative Assessment:  Post-procedure Vital Signs:  Pulse/HCG Rate: 6260 Temp: 98.2 F (36.8 C) Resp: 14 BP: (!) 175/95 SpO2: 100 %  EBL: None  Complications: No immediate post-treatment complications observed by team, or reported by patient.  Note: The patient tolerated the entire procedure well. A repeat set of vitals were taken after the procedure and the patient was kept under observation following institutional policy, for this type of procedure. Post-procedural neurological assessment was performed, showing return to baseline, prior to discharge. The patient was provided with post-procedure discharge instructions, including a section on how to identify potential problems. Should any problems arise concerning this procedure, the patient was given instructions to immediately contact us, at any time, without hesitation. In any case, we plan to contact the patient by telephone for a follow-up status report regarding this interventional procedure.  Comments:  No additional relevant information.  Plan of Care  Orders:  Orders Placed This Encounter  Procedures   HIP INJECTION    Scheduling Instructions:     Side: Left-sided     Sedation: No Sedation.     Timeframe: Today   DG PAIN [facility] C-ARM 1-60 MIN NO REPORT    Intraoperative interpretation by procedural physician at [facility].    Standing Status:   Standing    Number of Occurrences:   1    Order Specific Question:   Reason for exam:    Answer:   Assistance in needle guidance and placement for procedures requiring needle placement in or near specific anatomical locations not easily accessible without such assistance.   Care order/instruction: Please confirm that the patient has stopped the Eliquis (Apixaban) x 3 days prior to procedure or surgery.    Please confirm that the patient  has stopped the Eliquis (Apixaban) x 3 days prior to procedure or surgery.    Standing Status:  Standing    Number of Occurrences:   1   Provider attestation of informed consent for procedure/surgical case    I, the ordering provider, attest that I have discussed with the patient the benefits, risks, side effects, alternatives, likelihood of achieving goals and potential problems during recovery for the procedure that I have provided informed consent.    Standing Status:   Standing    Number of Occurrences:   1   Informed Consent Details: Transcribe to consent form and obtain patient signature    Consent Attestation: I, the ordering provider, attest that I have discussed with the patient the benefits, risks, side-effects, alternatives, likelihood of achieving goals, and potential problems during recovery for the procedure that I have provided informed consent.    Standing Status:   Standing    Number of Occurrences:   1    Order Specific Question:   Procedure    Answer:   Left-sided intra-articular hip joint injection under fluoroscopic guidance    Order Specific Question:   Surgeon    Answer:   Kerie Badger A. Dossie Arbour, MD    Order Specific Question:   Indication/Reason    Answer:   Left hip pain secondary to osteoarthritis of the hip   Bleeding precautions    Standing Status:   Standing    Number of Occurrences:   1   Chronic Opioid Analgesic:  Oxycodone IR 20 mg, 1 tab PO q 8 hrs (60 mg/day of oxycodone)(Has enough to last until 05/03/2019) MME/day: 90 mg/day.   Medications ordered for procedure: Meds ordered this encounter  Medications   lidocaine (XYLOCAINE) 2 % (with pres) injection 400 mg   ropivacaine (PF) 2 mg/mL (0.2%) (NAROPIN) injection 4 mL   methylPREDNISolone acetate (DEPO-MEDROL) injection 80 mg   Medications administered: We administered lidocaine, ropivacaine (PF) 2 mg/mL (0.2%), and methylPREDNISolone acetate.  See the medical record for exact dosing, route,  and time of administration.  Follow-up plan:   Return for (VV), 2 wk PP-F/U Eval.       Interventional management options:  Considering:   NOTE: ELIQUIS ANTICOAGULATION (Stop: x3 days prior to procedure   Restart 6 hours after procedures) Diagnostic bilateral sacroiliac joint block Possible bilateral sacroiliac joint RFA Diagnostic bilateral femoral nerve + obturator nerve block Possible bilateral femoral nerve + obturator nerve RFA Diagnostic right intra-articular shoulder joint injection   Palliative PRN treatment(s):   Palliative left CESI #3  Palliative left-sided trochanteric bursa injection #2  Palliative bilateral intra-articular hip jint inj. #2  (0% on the left but 100% on the right) Palliative bilateral lumbar facet block Palliative left lumbar facet RFA #4 (Last done on 08/01/18) Palliative left lumbar facet RFA #2 (Last done on 09/19/18) Diagnostic right intra-articular shoulder joint injection  Palliative bilateral suprascapular nerve block Palliative bilateral suprascapular nerve RFA #2(Last done 07/13/18)     Recent Visits Date Type Provider Dept  03/12/19 Office Visit Milinda Pointer, MD [facility]  02/08/19 Procedure visit Milinda Pointer, MD [facility]  01/22/19 Office Visit Milinda Pointer, MD [facility]  Showing recent visits within past 90 days and meeting all other requirements   Today's Visits Date Type Provider Dept  03/27/19 Procedure visit Milinda Pointer, MD [facility]  Showing today's visits and meeting all other requirements   Future Appointments Date Type Provider Dept  04/12/19 Appointment Milinda Pointer, MD [facility]  Showing future appointments within next 90 days and meeting all other requirements   Disposition: Discharge  home  Discharge Date & Time: 03/27/2019; 1056 hrs.   Primary Care Physician: Mikey College, NP Location: [facility] Note by: Gaspar Cola, MD Date: 03/27/2019; Time: 11:21 AM  Disclaimer:  Medicine is not an Chief Strategy Officer. The only guarantee in medicine is that nothing is guaranteed. It is important to note that the decision to proceed with this intervention was based on the information collected from the patient. The Data and conclusions were drawn from the patient's questionnaire, the interview, and the physical examination. Because the information was provided in large part by the patient, it cannot be guaranteed that it has not been purposely or unconsciously manipulated. Every effort has been made to obtain as much relevant data as possible for this evaluation. It is important to note that the conclusions that lead to this procedure are derived in large part from the available data. Always take into account that the treatment will also be dependent on availability of resources and existing treatment guidelines, considered by other Pain Management Practitioners as being common knowledge and practice, at the time of the intervention. For Medico-Legal purposes, it is also important to point out that variation in procedural techniques and pharmacological choices are the acceptable norm. The indications, contraindications, technique, and results of the above procedure should only be interpreted and judged by a Board-Certified Interventional Pain Specialist with extensive familiarity and expertise in the same exact procedure and technique.

## 2019-03-28 ENCOUNTER — Telehealth: Payer: Self-pay | Admitting: *Deleted

## 2019-03-28 NOTE — Telephone Encounter (Signed)
Denies any issues post procedure.

## 2019-03-31 ENCOUNTER — Other Ambulatory Visit: Payer: Self-pay | Admitting: Family Medicine

## 2019-03-31 DIAGNOSIS — J302 Other seasonal allergic rhinitis: Secondary | ICD-10-CM

## 2019-04-11 ENCOUNTER — Encounter: Payer: Self-pay | Admitting: Pain Medicine

## 2019-04-11 NOTE — Progress Notes (Signed)
Pain Management Virtual Encounter Note - Virtual Visit via Telephone Telehealth (real-time audio visits between healthcare provider and patient).   Patient's Phone No. & Preferred Pharmacy:  [phone] (home); There is no such number on file (mobile).; (Preferred) [phone] [email or web address]  [place], [place] - [address] [place] [address] Phone: [phone] Fax: [phone]    Pre-screening note:  Our staff contacted Ms. Wilczak and offered her an "in person", "face-to-face" appointment versus a telephone encounter. She indicated preferring the telephone encounter, at this time.   Reason for Virtual Visit: COVID-19*  Social distancing based on CDC and AMA recommendations.   I contacted [name] on 04/12/2019 via telephone.      I clearly identified myself as Gaspar Cola, MD. I verified that I was speaking with the correct person using two identifiers (Name: KATHERYN CULLITON, and date of birth: 1930/04/29).  Advanced Informed Consent I sought verbal advanced consent from [name] for virtual visit interactions. I informed Ms. Pohlman of possible security and privacy concerns, risks, and limitations associated with providing "not-in-person" medical evaluation and management services. I also informed Ms. Baird of the availability of "in-person" appointments. Finally, I informed her that there would be a charge for the virtual visit and that she could be  personally, fully or partially, financially responsible for it. Ms. Figiel expressed understanding and agreed to proceed.   Historic Elements   Kristin [name] is a 85 y.o. year old, female patient evaluated today after her last encounter by our practice on 03/28/2019. Ms. Menzel  has a past medical history of Acute postoperative pain (02/09/2018), Arrhythmia, sinus node (04/03/2014), Arthritis, Arthritis, Atrial fibrillation ([place]), Back pain, Bradycardia, Breast cancer  ([place]) (2004), Cancer ([place]) (2004), CHF (congestive heart failure) ([place]), Chronic back pain, CKD (chronic kidney disease), COPD (chronic obstructive pulmonary disease) ([place]), Cystocele, Decubitus ulcers, Dehydration, Gout, Hematuria, Hepatitis C, Hyperlipidemia, Hypertension, Hypomagnesemia (06/25/2015), OAB (overactive bladder), Overactive bladder, Restless leg, Shoulder pain, bilateral, Urinary frequency, UTI (lower urinary tract infection), and Vaginal atrophy. She also  has a past surgical history that includes Abdominal hysterectomy; Back surgery; Rotator cuff repair; Breast surgery; Cataract extraction w/PHACO (10/19/2011); Cataract extraction w/PHACO (11/02/2011); Carpal tunnel release; Joint replacement; Colon surgery; Pacemaker insertion (N/A, 12/24/2015); Cholecystectomy; Breast lumpectomy (Right, 2004); and Insert / replace / remove pacemaker. Ms. Latino has a current medication list which includes the following prescription(s): acetaminophen, albuterol, apixaban, bisacodyl, vitamin d, furosemide, levocetirizine, lisinopril, melatonin, oxycodone hcl, oxycodone hcl, oxycodone hcl, oxygen-helium, polyethylene glycol, and ropinirole. She  reports that she quit smoking about 20 years ago. Her smoking use included cigarettes. She has a 50.00 pack-year smoking history. She has never used smokeless tobacco. She reports that she does not drink alcohol or use drugs. Ms. Grawe is allergic to flexeril [cyclobenzaprine hcl] and vancomycin.   HPI  Today, she is being contacted for both, medication management and a post-procedure assessment.  The patient indicates doing well with the current medication regimen. No adverse reactions or side effects reported to the medications.   Post-Procedure Evaluation  Procedure: Therapeutic left-sided trochanteric bursa injection under fluoroscopic guidance, no sedation. Pre-procedure pain level:  6/10 Post-procedure: 0/10 (100% relief)  Sedation: None.  Effectiveness during  initial hour after procedure(Ultra-Short Term Relief): 100 %   Local anesthetic used: Long-acting (4-6 hours) Effectiveness: Defined as any analgesic benefit obtained secondary to the administration of local anesthetics. This carries significant diagnostic value as to the etiological location, or  anatomical origin, of the pain. Duration of benefit is expected to coincide with the duration of the local anesthetic used.  Effectiveness during initial 4-6 hours after procedure(Short-Term Relief): 100 %   Long-term benefit: Defined as any relief past the pharmacologic duration of the local anesthetics.  Effectiveness past the initial 6 hours after procedure(Long-Term Relief): 0 %(pt stated that she got no relief from the procedure)   Current benefits: Defined as benefit that persist at this time.   Analgesia:  Back to baseline Function: Back to baseline ROM: Back to baseline  Pharmacotherapy Assessment  Analgesic: Oxycodone IR 20 mg, 1 tab PO q 8 hrs (60 mg/day of oxycodone)(Has enough to last until 05/03/2019) MME/day: 90 mg/day.   Monitoring: Pharmacotherapy: No side-effects or adverse reactions reported. [place] PMP: PDMP reviewed during this encounter.       Compliance: No problems identified. Effectiveness: Clinically acceptable. Plan: Refer to "POC".  UDS:  Summary  Date Value Ref Range Status  02/01/2018 FINAL  Final    Comment:    ==================================================================== TOXASSURE SELECT 13 (MW) ==================================================================== Test                             Result       Flag       Units Drug Present and Declared for Prescription Verification   Oxycodone                      401          EXPECTED   ng/mg creat   Oxymorphone                    438          EXPECTED   ng/mg creat   Noroxycodone                   1122         EXPECTED   ng/mg creat   Noroxymorphone                 309          EXPECTED   ng/mg creat     Sources of oxycodone are scheduled prescription medications.    Oxymorphone, noroxycodone, and noroxymorphone are expected    metabolites of oxycodone. Oxymorphone is also available as a    scheduled prescription medication. ==================================================================== Test                      Result    Flag   Units      Ref Range   Creatinine              79               mg/dL      >=20 ==================================================================== Declared Medications:  The flagging and interpretation on this report are based on the  following declared medications.  Unexpected results may arise from  inaccuracies in the declared medications.  **Note: The testing scope of this panel includes these medications:  Oxycodone  **Note: The testing scope of this panel does not include following  reported medications:  Albuterol (Proventil)  Apixaban  Bisacodyl  Furosemide (Lasix)  Lisinopril  Melatonin  Polyethylene Glycol (MiraLAX)  Ropinirole  Vitamin D ==================================================================== For clinical consultation, please call [phone]. ====================================================================    Laboratory Chemistry Profile (12 mo)  Renal: 07/25/2018: BUN 29; BUN/Creatinine Ratio  19; Creat 1.49  Lab Results  Component Value Date   GFRAA 36 (L) 07/25/2018   GFRNONAA 31 (L) 07/25/2018   Hepatic: No results found for requested labs within last 8760 hours. Lab Results  Component Value Date   AST 21 07/25/2018   ALT 11 07/25/2018   Other: No results found for requested labs within last 8760 hours. Note: Above Lab results reviewed.  Imaging  Last 90 days:  Mr Hip Right Wo Contrast  Result Date: 02/09/2019 CLINICAL DATA:  Right hip pain. EXAM: MR OF THE RIGHT HIP WITHOUT CONTRAST TECHNIQUE: Multiplanar, multisequence MR imaging was performed. No intravenous contrast was administered. COMPARISON:   None. FINDINGS: Bones: There is moderate osteoarthritis of right hip with medial joint space narrowing, subcortical edema in the femoral head and acetabulum, and degenerative subcortical cyst formation in the femoral head and acetabulum. Articular cartilage and labrum Articular cartilage: Diffuse thinning of the articular cartilage, most prominent medially. Labrum:  Diffuse degeneration of the labrum. Joint or bursal effusion Joint effusion: The patient had a joint injection yesterday. There is a small amount of fluid in the joint. Bursae: No bursitis. Muscles and tendons Muscles and tendons: Muscles and tendons around the right hip are normal. Other findings Miscellaneous:   None IMPRESSION: Moderate osteoarthritis of the right hip as described. Electronically Signed   By: Lorriane Shire M.D.   On: 02/09/2019 17:37   Mr Hip Left Wo Contrast  Result Date: 02/09/2019 CLINICAL DATA:  Bilateral hip pain and chronic low back pain. EXAM: MR OF THE LEFT HIP WITHOUT CONTRAST TECHNIQUE: Multiplanar, multisequence MR imaging was performed. No intravenous contrast was administered. COMPARISON:  Radiographs dated 04/20/2017 and 02/08/2019 FINDINGS: Bones: There is medial joint space narrowing with small cystic degenerative changes of the femoral head and acetabulum. No fracture or avascular necrosis. Articular cartilage and labrum Articular cartilage: Diffuse thinning of the articular cartilage most prominent medially. Labrum:  The labrum is diffusely degenerated. Joint or bursal effusion Joint effusion: The patient had a joint injection yesterday. There is a small amount of fluid in the joint. Bursae: No bursitis. Muscles and tendons Muscles and tendons: Muscles around the hip appear normal. Slight edema in the posterior paraspinal musculature at the L5-S1 level, nonspecific. Other findings Miscellaneous:   None IMPRESSION: Slight arthritic changes of the left hip as described. Electronically Signed   By: Lorriane Shire  M.D.   On: 02/09/2019 17:36   [facility] C-arm 1-60 Min No Report  Result Date: 03/27/2019 Fluoro was used, but no Radiologist interpretation will be provided. Please refer to "NOTES" tab for provider progress note.  Dg Pain Clinic C-arm 1-60 Min No Report  Result Date: 02/19/2019 Fluoro was used, but no Radiologist interpretation will be provided. Please refer to "NOTES" tab for provider progress note.   Assessment  Diagnoses of Chronic pain syndrome, Insomnia, unspecified type, Opioid-induced constipation (OIC), and Restless leg syndrome were pertinent to this visit.  Plan of Care  I have discontinued Sharyne Richters Oxycodone HCl and Oxycodone HCl. I have also changed her Oxycodone HCl. Additionally, I am having her start on Oxycodone HCl and Oxycodone HCl. Lastly, I am having her maintain her apixaban, OXYGEN, Vitamin D, furosemide, albuterol, lisinopril, acetaminophen, levocetirizine, Melatonin, bisacodyl, polyethylene glycol, and rOPINIRole.  Pharmacotherapy (Medications Ordered): Meds ordered this encounter  Medications  . Oxycodone HCl 20 MG TABS    Sig: Take 1 tablet (20 mg total) by mouth every 8 (eight) hours as needed. Must last 30  days    Dispense:  90 tablet    Refill:  0    Chronic Pain: STOP Act (Not applicable) Fill 1 day early if closed on refill date. Do not fill until: 05/03/2019. To last until: 06/02/2019. Avoid benzodiazepines within 8 hours of opioids  . Melatonin 10 MG CAPS    Sig: Take 20 mg by mouth at bedtime as needed.    Dispense:  60 capsule    Refill:  2    Fill one day early if pharmacy is closed on scheduled refill date. May substitute for generic if available.  . bisacodyl (DULCOLAX) 5 MG EC tablet    Sig: Take 2 tablets (10 mg total) by mouth daily as needed for moderate constipation.    Dispense:  60 tablet    Refill:  2    Fill one day early if pharmacy is closed on scheduled refill date. May substitute for generic if available.  .  polyethylene glycol (MIRALAX / GLYCOLAX) 17 g packet    Sig: Take 17 g by mouth daily as needed for moderate constipation. Must last 30 days    Dispense:  30 packet    Refill:  2    Fill one day early if pharmacy is closed on scheduled refill date. May substitute for generic if available.  Marland Kitchen rOPINIRole (REQUIP) 2 MG tablet    Sig: Take 2 tablets (4 mg total) by mouth at bedtime. Must last 30 days    Dispense:  60 tablet    Refill:  2    Fill one day early if pharmacy is closed on scheduled refill date. May substitute for generic if available.  . Oxycodone HCl 20 MG TABS    Sig: Take 1 tablet (20 mg total) by mouth every 8 (eight) hours as needed. Must last 30 days    Dispense:  90 tablet    Refill:  0    Chronic Pain: STOP Act (Not applicable) Fill 1 day early if closed on refill date. Do not fill until: 06/02/2019. To last until: 07/02/2019. Avoid benzodiazepines within 8 hours of opioids  . Oxycodone HCl 20 MG TABS    Sig: Take 1 tablet (20 mg total) by mouth every 8 (eight) hours as needed. Must last 30 days    Dispense:  90 tablet    Refill:  0    Chronic Pain: STOP Act (Not applicable) Fill 1 day early if closed on refill date. Do not fill until: 07/02/2019. To last until: 08/01/2019. Avoid benzodiazepines within 8 hours of opioids   Orders:  No orders of the defined types were placed in this encounter.  Follow-up plan:   Return in about 4 months (around 08/01/2019) for (VV), E/M (MM).      Interventional management options:  Considering:   NOTE: ELIQUIS ANTICOAGULATION (Stop: x3 days prior to procedure  Restart 6 hours after procedures) Diagnostic bilateral sacroiliac joint block Possible bilateral sacroiliac joint RFA Diagnostic bilateral femoral nerve + obturator nerve block Possible bilateral femoral nerve + obturator nerve RFA Diagnostic right intra-articular shoulder joint injection   Palliative PRN treatment(s):   Palliative left CESI #3  Palliative left-sided  trochanteric bursa injection #2  Palliative bilateral intra-articular hip jint inj. #2  (0% on the left but 100% on the right) Palliative bilateral lumbar facet block Palliative left lumbar facet RFA #4 (Last done on 08/01/18) Palliative left lumbar facet RFA #2 (Last done on 09/19/18) Diagnostic right intra-articular shoulder joint injection  Palliative bilateral suprascapular nerve block  Palliative bilateral suprascapular nerve RFA #2(Last done 07/13/18)      Recent Visits Date Type Provider Dept  03/27/19 Procedure visit Milinda Pointer, MD [facility]  03/12/19 Office Visit Milinda Pointer, MD [facility]  02/08/19 Procedure visit Milinda Pointer, MD [facility]  01/22/19 Office Visit Milinda Pointer, MD [facility]  Showing recent visits within past 90 days and meeting all other requirements   Today's Visits Date Type Provider Dept  04/12/19 Office Visit Milinda Pointer, MD [facility]  Showing today's visits and meeting all other requirements   Future Appointments No visits were found meeting these conditions.  Showing future appointments within next 90 days and meeting all other requirements   I discussed the assessment and treatment plan with the patient. The patient was provided an opportunity to ask questions and all were answered. The patient agreed with the plan and demonstrated an understanding of the instructions.  Patient advised to call back or seek an in-person evaluation if the symptoms or condition worsens.  Total duration of non-face-to-face encounter: 12 minutes.  Note by: Gaspar Cola, MD Date: 04/12/2019; Time: 4:06 PM  Note: This dictation was prepared with Dragon dictation. Any transcriptional errors that may result from this process are unintentional.  Disclaimer:  * Given the special circumstances of the COVID-19 pandemic, the federal government has announced that the Office  for Civil Rights (OCR) will exercise its enforcement discretion and will not impose penalties on physicians using telehealth in the event of noncompliance with regulatory requirements under the [place] (HIPAA) in connection with the good faith provision of telehealth during the MEQAS-34 national public health emergency. ([place])

## 2019-04-12 ENCOUNTER — Other Ambulatory Visit: Payer: Self-pay

## 2019-04-12 ENCOUNTER — Ambulatory Visit: Payer: Medicare Other | Attending: Pain Medicine | Admitting: Pain Medicine

## 2019-04-12 DIAGNOSIS — T402X5A Adverse effect of other opioids, initial encounter: Secondary | ICD-10-CM

## 2019-04-12 DIAGNOSIS — K5903 Drug induced constipation: Secondary | ICD-10-CM

## 2019-04-12 DIAGNOSIS — G2581 Restless legs syndrome: Secondary | ICD-10-CM

## 2019-04-12 DIAGNOSIS — G4700 Insomnia, unspecified: Secondary | ICD-10-CM | POA: Diagnosis not present

## 2019-04-12 DIAGNOSIS — G894 Chronic pain syndrome: Secondary | ICD-10-CM

## 2019-04-12 MED ORDER — MELATONIN 10 MG PO CAPS: 20.0000 mg | ORAL_CAPSULE | Freq: Every evening | ORAL | 2 refills | Status: DC | PRN

## 2019-04-12 MED ORDER — OXYCODONE HCL 20 MG PO TABS: 20.0000 mg | ORAL_TABLET | Freq: Three times a day (TID) | ORAL | 0 refills | Status: DC | PRN

## 2019-04-12 MED ORDER — ROPINIROLE HCL 2 MG PO TABS: 4.0000 mg | ORAL_TABLET | Freq: Every day | ORAL | 2 refills | Status: DC

## 2019-04-12 MED ORDER — BISACODYL 5 MG PO TBEC: 10.0000 mg | DELAYED_RELEASE_TABLET | Freq: Every day | ORAL | 2 refills | Status: DC | PRN

## 2019-04-12 MED ORDER — POLYETHYLENE GLYCOL 3350 17 G PO PACK: 17.0000 g | PACK | Freq: Every day | ORAL | 2 refills | Status: DC | PRN

## 2019-04-16 ENCOUNTER — Ambulatory Visit: Payer: Medicare Other | Admitting: Pain Medicine

## 2019-04-18 ENCOUNTER — Other Ambulatory Visit: Payer: Self-pay

## 2019-04-18 ENCOUNTER — Ambulatory Visit: Payer: Medicare Other | Admitting: Nurse Practitioner

## 2019-05-03 ENCOUNTER — Telehealth: Payer: Self-pay

## 2019-05-03 NOTE — Telephone Encounter (Signed)
She had a med refill appt 8/20 and she said her pharmacy is telling her Dr. Dossie Arbour didn't call in her meds. Please look into this and let her know.

## 2019-05-03 NOTE — Telephone Encounter (Signed)
Called patient and informed her that medications were scheduled to be filled today. Patient with understanding.

## 2019-05-08 ENCOUNTER — Ambulatory Visit (INDEPENDENT_AMBULATORY_CARE_PROVIDER_SITE_OTHER): Payer: Medicare Other

## 2019-05-08 ENCOUNTER — Ambulatory Visit (INDEPENDENT_AMBULATORY_CARE_PROVIDER_SITE_OTHER): Payer: Medicare Other | Admitting: Nurse Practitioner

## 2019-05-08 ENCOUNTER — Other Ambulatory Visit: Payer: Self-pay

## 2019-05-08 ENCOUNTER — Encounter: Payer: Self-pay | Admitting: Nurse Practitioner

## 2019-05-08 VITALS — BP 118/72 | HR 90 | Resp 16 | Ht 68.0 in | Wt 162.0 lb

## 2019-05-08 VITALS — BP 118/72 | HR 90 | Ht 68.0 in | Wt 162.0 lb

## 2019-05-08 DIAGNOSIS — M4696 Unspecified inflammatory spondylopathy, lumbar region: Secondary | ICD-10-CM

## 2019-05-08 DIAGNOSIS — J302 Other seasonal allergic rhinitis: Secondary | ICD-10-CM | POA: Diagnosis not present

## 2019-05-08 DIAGNOSIS — I5032 Chronic diastolic (congestive) heart failure: Secondary | ICD-10-CM

## 2019-05-08 DIAGNOSIS — Z23 Encounter for immunization: Secondary | ICD-10-CM

## 2019-05-08 DIAGNOSIS — Z0000 Encounter for general adult medical examination without abnormal findings: Secondary | ICD-10-CM | POA: Diagnosis not present

## 2019-05-08 DIAGNOSIS — I10 Essential (primary) hypertension: Secondary | ICD-10-CM | POA: Diagnosis not present

## 2019-05-08 MED ORDER — LEVOCETIRIZINE DIHYDROCHLORIDE 5 MG PO TABS: 5.0000 mg | ORAL_TABLET | Freq: Every day | ORAL | 4 refills | Status: DC

## 2019-05-08 MED ORDER — FUROSEMIDE 20 MG PO TABS: ORAL_TABLET | ORAL | 4 refills | Status: DC

## 2019-05-08 MED ORDER — LISINOPRIL 10 MG PO TABS: 10.0000 mg | ORAL_TABLET | Freq: Every day | ORAL | 3 refills | Status: DC

## 2019-05-08 NOTE — Patient Instructions (Addendum)
Kristin [name] , Thank you for taking time to come for your Medicare Wellness Visit. I appreciate your ongoing commitment to your health goals. Please review the following plan we discussed and let me know if I can assist you in the future.   Screening recommendations/referrals: Colonoscopy: no longer required Mammogram: no longer required Bone Density: completed 06/17/2015 Recommended yearly ophthalmology/optometry visit for glaucoma screening and checkup Recommended yearly dental visit for hygiene and checkup  Vaccinations: Influenza vaccine: done today  Pneumococcal vaccine: up to date Tdap vaccine: up to date Shingles vaccine: shingrix eligible     Advanced directives: Advance directive discussed with you today. I have provided a copy for you to complete at home and have notarized. Once this is complete please bring a copy in to our office so we can scan it into your chart.  Conditions/risks identified: fall preventions discussed, discussed chronic care management team for [facility]. Someone will reach out to you to discuss assistance with this   Next appointment: Follow up in one year for your annual wellness visit   Preventive Care 65 Years and Older, Female Preventive care refers to lifestyle choices and visits with your health care provider that can promote health and wellness. What does preventive care include?  A yearly physical exam. This is also called an annual well check.  Dental exams once or twice a year.  Routine eye exams. Ask your health care provider how often you should have your eyes checked.  Personal lifestyle choices, including:  Daily care of your teeth and gums.  Regular physical activity.  Eating a healthy diet.  Avoiding tobacco and drug use.  Limiting alcohol use.  Practicing safe sex.  Taking low-dose aspirin every day.  Taking vitamin and mineral supplements as recommended by your health care provider. What happens during an annual well  check? The services and screenings done by your health care provider during your annual well check will depend on your age, overall health, lifestyle risk factors, and family history of disease. Counseling  Your health care provider may ask you questions about your:  Alcohol use.  Tobacco use.  Drug use.  Emotional well-being.  Home and relationship well-being.  Sexual activity.  Eating habits.  History of falls.  Memory and ability to understand (cognition).  Work and work Statistician.  Reproductive health. Screening  You may have the following tests or measurements:  Height, weight, and BMI.  Blood pressure.  Lipid and cholesterol levels. These may be checked every 5 years, or more frequently if you are over 7 years old.  Skin check.  Lung cancer screening. You may have this screening every year starting at age 60 if you have a 30-pack-year history of smoking and currently smoke or have quit within the past 15 years.  Fecal occult blood test (FOBT) of the stool. You may have this test every year starting at age 19.  Flexible sigmoidoscopy or colonoscopy. You may have a sigmoidoscopy every 5 years or a colonoscopy every 10 years starting at age 10.  Hepatitis C blood test.  Hepatitis B blood test.  Sexually transmitted disease (STD) testing.  Diabetes screening. This is done by checking your blood sugar (glucose) after you have not eaten for a while (fasting). You may have this done every 1-3 years.  Bone density scan. This is done to screen for osteoporosis. You may have this done starting at age 4.  Mammogram. This may be done every 1-2 years. Talk to your health care provider about  how often you should have regular mammograms. Talk with your health care provider about your test results, treatment options, and if necessary, the need for more tests. Vaccines  Your health care provider may recommend certain vaccines, such as:  Influenza vaccine. This is  recommended every year.  Tetanus, diphtheria, and acellular pertussis (Tdap, Td) vaccine. You may need a Td booster every 10 years.  Zoster vaccine. You may need this after age 107.  Pneumococcal 13-valent conjugate (PCV13) vaccine. One dose is recommended after age 60.  Pneumococcal polysaccharide (PPSV23) vaccine. One dose is recommended after age 9. Talk to your health care provider about which screenings and vaccines you need and how often you need them. This information is not intended to replace advice given to you by your health care provider. Make sure you discuss any questions you have with your health care provider. Document Released: 09/05/2015 Document Revised: 04/28/2016 Document Reviewed: 06/10/2015 Elsevier Interactive Patient Education  2017 [place] Prevention in the Home Falls can cause injuries. They can happen to people of all ages. There are many things you can do to make your home safe and to help prevent falls. What can I do on the outside of my home?  Regularly fix the edges of walkways and driveways and fix any cracks.  Remove anything that might make you trip as you walk through a door, such as a raised step or threshold.  Trim any bushes or trees on the path to your home.  Use bright outdoor lighting.  Clear any walking paths of anything that might make someone trip, such as rocks or tools.  Regularly check to see if handrails are loose or broken. Make sure that both sides of any steps have handrails.  Any raised decks and porches should have guardrails on the edges.  Have any leaves, snow, or ice cleared regularly.  Use sand or salt on walking paths during winter.  Clean up any spills in your garage right away. This includes oil or grease spills. What can I do in the bathroom?  Use night lights.  Install grab bars by the toilet and in the tub and shower. Do not use towel bars as grab bars.  Use non-skid mats or decals in the tub or  shower.  If you need to sit down in the shower, use a plastic, non-slip stool.  Keep the floor dry. Clean up any water that spills on the floor as soon as it happens.  Remove soap buildup in the tub or shower regularly.  Attach bath mats securely with double-sided non-slip rug tape.  Do not have throw rugs and other things on the floor that can make you trip. What can I do in the bedroom?  Use night lights.  Make sure that you have a light by your bed that is easy to reach.  Do not use any sheets or blankets that are too big for your bed. They should not hang down onto the floor.  Have a firm chair that has side arms. You can use this for support while you get dressed.  Do not have throw rugs and other things on the floor that can make you trip. What can I do in the kitchen?  Clean up any spills right away.  Avoid walking on wet floors.  Keep items that you use a lot in easy-to-reach places.  If you need to reach something above you, use a strong step stool that has a grab bar.  Keep  electrical cords out of the way.  Do not use floor polish or wax that makes floors slippery. If you must use wax, use non-skid floor wax.  Do not have throw rugs and other things on the floor that can make you trip. What can I do with my stairs?  Do not leave any items on the stairs.  Make sure that there are handrails on both sides of the stairs and use them. Fix handrails that are broken or loose. Make sure that handrails are as long as the stairways.  Check any carpeting to make sure that it is firmly attached to the stairs. Fix any carpet that is loose or worn.  Avoid having throw rugs at the top or bottom of the stairs. If you do have throw rugs, attach them to the floor with carpet tape.  Make sure that you have a light switch at the top of the stairs and the bottom of the stairs. If you do not have them, ask someone to add them for you. What else can I do to help prevent falls?   Wear shoes that:  Do not have high heels.  Have rubber bottoms.  Are comfortable and fit you well.  Are closed at the toe. Do not wear sandals.  If you use a stepladder:  Make sure that it is fully opened. Do not climb a closed stepladder.  Make sure that both sides of the stepladder are locked into place.  Ask someone to hold it for you, if possible.  Clearly mark and make sure that you can see:  Any grab bars or handrails.  First and last steps.  Where the edge of each step is.  Use tools that help you move around (mobility aids) if they are needed. These include:  Canes.  Walkers.  Scooters.  Crutches.  Turn on the lights when you go into a dark area. Replace any light bulbs as soon as they burn out.  Set up your furniture so you have a clear path. Avoid moving your furniture around.  If any of your floors are uneven, fix them.  If there are any pets around you, be aware of where they are.  Review your medicines with your doctor. Some medicines can make you feel dizzy. This can increase your chance of falling. Ask your doctor what other things that you can do to help prevent falls. This information is not intended to replace advice given to you by your health care provider. Make sure you discuss any questions you have with your health care provider. Document Released: 06/05/2009 Document Revised: 01/15/2016 Document Reviewed: 09/13/2014 Elsevier Interactive Patient Education  2017 [employer].

## 2019-05-08 NOTE — Progress Notes (Signed)
Subjective:    [name], female    DOB: [birth year], 85 y.o.   MRN: [id number]  Kristin [name] is a 85 y.o. female presenting on 05/08/2019 for Hypertension and Depression   HPI Hypertension - She is checking BP at home or outside of clinic.  Readings 115-125/70s - Current medications: furosemide, lisinopril, tolerating well without side effects - She is not currently symptomatic. - Pt denies headache, lightheadedness, dizziness, changes in vision, chest tightness/pressure, palpitations, leg swelling, sudden loss of speech or loss of consciousness. - She  reports no regular exercise routine.  Limited due to hip/back pain. - Her diet is moderate in salt, moderate in fat, and moderate in carbohydrates.   CHF Urinates 2-3 times at night.  Patient drinks about 60 oz water per day.  Depression Patient is currently taking duloxetine only 2-3 nights per week.  Feels well and has no significant depression today.  Denies SI/HI, feeling down.  Depression screen [facility] 2/9 05/08/2019 10/31/2018 10/26/2018 10/26/2018 09/19/2018  Decreased Interest 0 1 2 0 0  Down, Depressed, Hopeless 0 0 0 0 0  PHQ - 2 Score 0 1 2 0 0  Altered sleeping 0 - 1 - -  Tired, decreased energy 0 - 3 - -  Change in appetite 0 - 0 - -  Feeling bad or failure about yourself  0 - 0 - -  Trouble concentrating 0 - 0 - -  Moving slowly or fidgety/restless 0 - 1 - -  Suicidal thoughts 0 - 0 - -  PHQ-9 Score 0 - 7 - -  Difficult doing work/chores Not difficult at all - Somewhat difficult - -  Some recent data might be hidden    GAD 7 : Generalized Anxiety Score 10/26/2018  Nervous, Anxious, on Edge 0  Control/stop worrying 0  Worry too much - different things 0  Trouble relaxing 0  Restless 0  Easily annoyed or irritable 0  Afraid - awful might happen 0  Total GAD 7 Score 0  Anxiety Difficulty Not difficult at all   Social History   Tobacco Use  . Smoking status: Former Smoker    Packs/day: 1.25    Years:  40.00    Pack years: 50.00    Types: Cigarettes    Quit date: 12/16/1998    Years since quitting: 20.4  . Smokeless tobacco: Never Used  Substance Use Topics  . Alcohol use: No    Alcohol/week: 0.0 standard drinks  . Drug use: No    Review of Systems Per HPI unless specifically indicated above     Objective:    BP 118/72 (BP Location: Left Arm, Patient Position: Sitting, Cuff Size: Normal)   Pulse 90   Ht 5\' 8"  (1.727 m)   Wt 162 lb (73.5 kg)   BMI 24.63 kg/m   Wt Readings from Last 3 Encounters:  05/08/19 162 lb (73.5 kg)  03/27/19 165 lb (74.8 kg)  02/08/19 165 lb (74.8 kg)    Physical Exam Vitals signs reviewed.  Constitutional:      General: She is awake. She is not in acute distress.    Appearance: She is well-developed.  HENT:     Head: Normocephalic and atraumatic.  Neck:     Musculoskeletal: Normal range of motion and neck supple.     Vascular: No carotid bruit or hepatojugular reflux.  Cardiovascular:     Rate and Rhythm: Normal rate and regular rhythm.     Pulses:  Radial pulses are 2+ on the right side and 2+ on the left side.       Posterior tibial pulses are 1+ on the right side and 1+ on the left side.     Heart sounds: S1 normal and S2 normal. Murmur present.  Pulmonary:     Effort: Pulmonary effort is normal. No respiratory distress.     Breath sounds: Normal breath sounds and air entry.  Musculoskeletal:     Right lower leg: 1+ Pitting Edema present.     Left lower leg: 1+ Pitting Edema present.  Skin:    General: Skin is warm and dry.     Capillary Refill: Capillary refill takes less than 2 seconds.  Neurological:     General: No focal deficit present.     Mental Status: She is alert and oriented to person, place, and time. Mental status is at baseline.  Psychiatric:        Attention and Perception: Attention normal.        Mood and Affect: Mood and affect normal.        Behavior: Behavior normal. Behavior is cooperative.         Thought Content: Thought content normal.        Judgment: Judgment normal.    Results for orders placed or performed during the hospital encounter of 02/05/19  Novel Coronavirus, NAA (hospital order; send-out to ref lab)   Specimen: Nasopharyngeal Swab; Respiratory  Result Value Ref Range   SARS-CoV-2, NAA NOT DETECTED NOT DETECTED   Coronavirus Source NASOPHARYNGEAL       Assessment & Plan:   Problem List Items Addressed This Visit      Cardiovascular and Mediastinum   Benign essential HTN - Primary Controlled hypertension.  BP goal < 130/80.  Pt is working on lifestyle modifications.  Taking medications tolerating well without side effects. CHF, CKD are complications.  Plan: 1. Continue taking lisinopril, furosemide 2. Obtain labs today  3. Encouraged heart healthy diet and increasing exercise to 30 minutes most days of the week. 4. Check BP 1-2 x per week at home, keep log, and bring to clinic at next appointment. 5. Follow up 6 months.     Relevant Medications   furosemide (LASIX) 20 MG tablet   lisinopril (ZESTRIL) 10 MG tablet   Other Relevant Orders   BASIC METABOLIC PANEL WITH GFR (Completed)   Chronic diastolic CHF (congestive heart failure) (HCC) Clinically stable on exam today.  Mild pedal edema only.  Continues management with HF clinic, [name], NP.   Relevant Medications   furosemide (LASIX) 20 MG tablet   lisinopril (ZESTRIL) 10 MG tablet   Other Relevant Orders   BASIC METABOLIC PANEL WITH GFR (Completed)     Other   Seasonal allergies Consistent with chronic allergic rhinitis, seasonal, unknown triggers. - Today no evidence of acute sinusitis or complication.  - Continues on Xyzal for symptom control   Plan: 1. Continue antihistamine levocetirizine 5 mg once daily. 2. Can continue saline nasal spray as needed. 3. Follow-up in future, as needed, consider 2nd opinion from ENT/allergy.   Relevant Medications   levocetirizine (XYZAL) 5 MG tablet     Other Visit Diagnoses    Need for immunization against influenza     Pt > age 22.  Needs annual influenza vaccine.  Plan: 1. Administer high dose fluad today.    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  Pain limits some movements, but is not limiting patient's ADLs significantly at this time other than taking more time to complete. Continue duloxetine.  Follow-up prn and with pain management.      Meds ordered this encounter  Medications  . furosemide (LASIX) 20 MG tablet    Sig: TAKE (1/2) TABLET BY MOUTH ONCE DAILY.    Dispense:  45 tablet    Refill:  4    Order Specific Question:   Supervising Provider    Answer:   Olin Hauser [[id number]]  . lisinopril (ZESTRIL) 10 MG tablet    Sig: Take 1 tablet (10 mg total) by mouth daily.    Dispense:  90 tablet    Refill:  3    Order Specific Question:   Supervising Provider    Answer:   Olin Hauser [[id number]]  . levocetirizine (XYZAL) 5 MG tablet    Sig: Take 1 tablet (5 mg total) by mouth daily.    Dispense:  90 tablet    Refill:  4    Order Specific Question:   Supervising Provider    Answer:   Olin Hauser [[id number]]   Follow up plan: Return in about 6 months (around 11/05/2019) for hypertension.  Kristin Smiles, DNP, AGPCNP-BC Adult Gerontology Primary Care Nurse Practitioner [facility] 05/08/2019, 10:21 AM

## 2019-05-08 NOTE — Patient Instructions (Addendum)
Kristin [name],   Thank you for coming in to clinic today.  1. Continue off the duloxetine.  2. No need for mammogram at this age.  Your breast was many years ago.  Your chances of recurrence are VERY low at this time.  3. Continue lisinopril, and furosemide.  No changes today.  Please schedule a follow-up appointment with [name], AGNP. Return in about 6 months (around 11/05/2019) for hypertension.  If you have any other questions or concerns, please feel free to call the clinic or send a message through [place]. You may also schedule an earlier appointment if necessary.  You will receive a survey after today's visit either digitally by e-mail or paper by [employer]. Your experiences and feedback matter to [name].  Please respond so we know how we are doing as we provide care for you.   [name], DNP, AGNP-BC Adult Gerontology Nurse Practitioner [place]

## 2019-05-08 NOTE — Progress Notes (Signed)
Subjective:   Kristin [name] is a 85 y.o. female who presents for Medicare Annual (Subsequent) preventive examination.    Review of Systems:   Cardiac Risk Factors include: advanced age (>17men, >33 women);dyslipidemia;hypertension     Objective:     Vitals: BP 118/72 (BP Location: Left Arm, Patient Position: Sitting, Cuff Size: Normal)   Pulse 90   Resp 16   Ht 5\' 8"  (1.727 m)   Wt 162 lb (73.5 kg)   BMI 24.63 kg/m   Body mass index is 24.63 kg/m.  Advanced Directives 05/08/2019 10/31/2018 08/01/2018 07/13/2018 04/25/2018 02/01/2018 10/13/2017  Does Patient Have a Medical Advance Directive? No No No No Yes Yes No  Type of Advance Directive - - - - Press photographer;Living will [place];Living will -  Copy of [place] in Chart? - - - - No - copy requested No - copy requested -  Would patient like information on creating a medical advance directive? Yes (MAU/Ambulatory/Procedural Areas - Information given) No - Patient declined No - Patient declined No - Patient declined - - -  Pre-existing out of facility DNR order (yellow form or pink MOST form) - - - - - - -    Tobacco Social History   Tobacco Use  Smoking Status Former Smoker  . Packs/day: 1.25  . Years: 40.00  . Pack years: 50.00  . Types: Cigarettes  . Quit date: 12/16/1998  . Years since quitting: 20.4  Smokeless Tobacco Never Used     Counseling given: Not Answered   Clinical Intake:  Pre-visit preparation completed: Yes  Pain : 0-10 Pain Score: 6  Pain Type: Chronic pain Pain Location: Hip Pain Orientation: Left Pain Descriptors / Indicators: Aching Pain Onset: More than a month ago Pain Frequency: Constant     Nutritional Status: BMI of 19-24  Normal Nutritional Risks: None Diabetes: No  How often do you need to have someone help you when you read instructions, pamphlets, or other written materials from your doctor or pharmacy?: 1 - Never   Interpreter Needed?: No  Information entered by :: Shakyra Mattera,LPN  Past Medical History:  Diagnosis Date  . Acute postoperative pain 02/09/2018  . Arrhythmia, sinus node 04/03/2014  . Arthritis   . Arthritis   . Atrial fibrillation ([place])   . Back pain    lower back chronic  . Bradycardia   . Breast cancer ([place]) 2004   right breast cancer  . Cancer [facility]) 2004   rt breast cancer-post lumpectomy- chemo/rad  . CHF (congestive heart failure) ([place])   . Chronic back pain   . CKD (chronic kidney disease)   . COPD (chronic obstructive pulmonary disease) (HCC)    wears O2 at 2L via [place] at night  . Cystocele   . Decubitus ulcers   . Dehydration   . Gout   . Hematuria   . Hepatitis C   . Hyperlipidemia   . Hypertension   . Hypomagnesemia 06/25/2015  . OAB (overactive bladder)   . Overactive bladder   . Restless leg   . Shoulder pain, bilateral   . Urinary frequency   . UTI (lower urinary tract infection)   . Vaginal atrophy    Past Surgical History:  Procedure Laterality Date  . ABDOMINAL HYSTERECTOMY    . BACK SURGERY    . BREAST LUMPECTOMY Right 2004  . BREAST SURGERY     rt lumpectomy  . CARPAL TUNNEL RELEASE    .  CATARACT EXTRACTION W/PHACO  10/19/2011   Procedure: CATARACT EXTRACTION PHACO AND INTRAOCULAR LENS PLACEMENT (IOC);  Surgeon: Elta Guadeloupe T. Gershon Crane, MD;  Location: [facility];  Service: Ophthalmology;  Laterality: Right;  CDE:10.81  . CATARACT EXTRACTION W/PHACO  11/02/2011   Procedure: CATARACT EXTRACTION PHACO AND INTRAOCULAR LENS PLACEMENT (IOC);  Surgeon: Elta Guadeloupe T. Gershon Crane, MD;  Location: [facility];  Service: Ophthalmology;  Laterality: Left;  CDE 8.60  . CHOLECYSTECTOMY     3/18  . COLON SURGERY    . INSERT / REPLACE / REMOVE PACEMAKER    . JOINT REPLACEMENT     bilateral TKA  . PACEMAKER INSERTION N/A 12/24/2015   Procedure: INSERTION PACEMAKER;  Surgeon: Isaias Cowman, MD;  Location: [facility] ORS;  Service: Cardiovascular;  Laterality: N/A;  . ROTATOR CUFF REPAIR      bilateral   Family History  Problem Relation Age of Onset  . Kidney disease Brother   . Heart disease Father   . Heart disease Mother   . Stroke Mother   . Cancer Sister   . Breast cancer Sister 40  . Breast cancer Paternal Aunt   . Anesthesia problems Neg Hx   . Hypotension Neg Hx   . Malignant hyperthermia Neg Hx   . Pseudochol deficiency Neg Hx    Social History   Socioeconomic History  . Marital status: Single    Spouse name: Not on file  . Number of children: 2  . Years of education: 37  . Highest education level: 12th grade  Occupational History  . Occupation: retired  Scientific laboratory technician  . Financial resource strain: Somewhat hard  . Food insecurity    Worry: Often true    Inability: Often true  . Transportation needs    Medical: No    Non-medical: No  Tobacco Use  . Smoking status: Former Smoker    Packs/day: 1.25    Years: 40.00    Pack years: 50.00    Types: Cigarettes    Quit date: 12/16/1998    Years since quitting: 20.4  . Smokeless tobacco: Never Used  Substance and Sexual Activity  . Alcohol use: No    Alcohol/week: 0.0 standard drinks  . Drug use: No  . Sexual activity: Not Currently    Birth control/protection: Surgical  Lifestyle  . Physical activity    Days per week: 0 days    Minutes per session: 0 min  . Stress: Rather much  Relationships  . Social connections    Talks on phone: More than three times a week    Gets together: Three times a week    Attends religious service: More than 4 times per year    Active member of club or organization: No    Attends meetings of clubs or organizations: Never    Relationship status: Patient refused  Other Topics Concern  . Not on file  Social History Narrative  . Not on file    Outpatient Encounter Medications as of 05/08/2019  Medication Sig  . acetaminophen (TYLENOL) 500 MG tablet Take 500 mg by mouth 3 (three) times daily.  Marland Kitchen albuterol (PROVENTIL) (2.5 MG/3ML) 0.083% nebulizer solution Take 3  mLs (2.5 mg total) by nebulization every 8 (eight) hours as needed for wheezing or shortness of breath.  Marland Kitchen apixaban (ELIQUIS) 5 MG TABS tablet Take 1 tablet (5 mg total) by mouth 2 (two) times daily.  . bisacodyl (DULCOLAX) 5 MG EC tablet Take 2 tablets (10 mg total) by mouth daily as needed for moderate  constipation.  . Cholecalciferol (VITAMIN D) 2000 units tablet Take 2,000 Units by mouth daily.   . furosemide (LASIX) 20 MG tablet TAKE (1/2) TABLET BY MOUTH ONCE DAILY.  Marland Kitchen levocetirizine (XYZAL) 5 MG tablet Take 1 tablet (5 mg total) by mouth daily.  Marland Kitchen lisinopril (ZESTRIL) 10 MG tablet Take 1 tablet (10 mg total) by mouth daily.  . Melatonin 10 MG CAPS Take 20 mg by mouth at bedtime as needed.  . Oxycodone HCl 20 MG TABS Take 1 tablet (20 mg total) by mouth every 8 (eight) hours as needed. Must last 30 days  . [START ON 06/02/2019] Oxycodone HCl 20 MG TABS Take 1 tablet (20 mg total) by mouth every 8 (eight) hours as needed. Must last 30 days  . [START ON 07/02/2019] Oxycodone HCl 20 MG TABS Take 1 tablet (20 mg total) by mouth every 8 (eight) hours as needed. Must last 30 days  . OXYGEN Inhale 2 L into the lungs at bedtime.   . polyethylene glycol (MIRALAX / GLYCOLAX) 17 g packet Take 17 g by mouth daily as needed for moderate constipation. Must last 30 days  . rOPINIRole (REQUIP) 2 MG tablet Take 2 tablets (4 mg total) by mouth at bedtime. Must last 30 days  . [DISCONTINUED] furosemide (LASIX) 20 MG tablet TAKE (1/2) TABLET BY MOUTH ONCE DAILY.  . [DISCONTINUED] levocetirizine (XYZAL) 5 MG tablet TAKE 1 TABLET BY MOUTH ONCE DAILY.  . [DISCONTINUED] lisinopril (ZESTRIL) 10 MG tablet TAKE 1 TABLET BY MOUTH ONCE DAILY.   No facility-administered encounter medications on file as of 05/08/2019.     Activities of Daily Living In your present state of health, do you have any difficulty performing the following activities: 05/08/2019 05/08/2019  Hearing? Y Y  Comment no hearing aids -  Vision? N Y   Comment eyeglasses, Dr.Shapiro -  Difficulty concentrating or making decisions? N N  Walking or climbing stairs? Y Y  Comment due to pain back and hip pain  Dressing or bathing? N N  Doing errands, shopping? N N  Preparing Food and eating ? N -  Using the Toilet? N -  In the past six months, have you accidently leaked urine? N -  Do you have problems with loss of bowel control? N -  Managing your Medications? N -  Managing your Finances? N -  Housekeeping or managing your Housekeeping? N -  Some recent data might be hidden    Patient Care Team: Mikey College, NP as PCP - General (Nurse Practitioner) Olin Hauser, DO as Consulting Physician (Family Medicine) Isaias Cowman, MD as Consulting Physician (Cardiology) Milinda Pointer, MD as Referring Physician (Pain Medicine) Alisa Graff, FNP as Nurse Practitioner (Family Medicine)    Assessment:   This is a routine wellness examination for Rhys.  Exercise Activities and Dietary recommendations Current Exercise Habits: The patient does not participate in regular exercise at present, Exercise limited by: None identified  Goals    . DIET - INCREASE WATER INTAKE     Recommend drinking at least 6-8 glasses of water a day        Fall Risk: Fall Risk  03/27/2019 02/08/2019 10/31/2018 09/19/2018 08/01/2018  Falls in the past year? 0 0 0 0 0  Number falls in past yr: - - 0 - -  Injury with Fall? - - 0 - -  Risk for fall due to : - - - - -    [place]  HOME:  Any stairs in or around the home? Yes  If so, are there any without handrails? No   Home free of loose throw rugs in walkways, pet beds, electrical cords, etc? Yes  Adequate lighting in your home to reduce risk of falls? Yes   ASSISTIVE DEVICES UTILIZED TO PREVENT FALLS:  Life alert? Yes  Use of a cane, walker or w/c? Yes  Grab bars in the bathroom? Yes  Shower chair or bench in shower? Yes  Elevated toilet seat  or a handicapped toilet? Yes   DME ORDERS:  DME order needed?  No   TIMED UP AND GO:  Was the test performed? Yes .  Length of time to ambulate 10 feet: 12 sec.   GAIT:  Appearance of gait: Gait slow and steady with the use of an assistive device.  Education: Fall risk prevention has been discussed.  Intervention(s) required? No   DME/home health order needed?  No    Depression Screen PHQ 2/9 Scores 05/08/2019 10/31/2018 10/26/2018 10/26/2018  PHQ - 2 Score 0 1 2 0  PHQ- 9 Score 0 - 7 -  Exception Documentation - - - -     Cognitive Function     6CIT Screen 05/08/2019 04/25/2018 04/19/2017  What Year? 0 points 0 points 0 points  What month? 0 points 0 points 0 points  What time? 0 points 0 points 0 points  Count back from 20 0 points 0 points 0 points  Months in reverse 0 points 0 points 0 points  Repeat phrase 0 points 2 points 0 points  Total Score 0 2 0    Immunization History  Administered Date(s) Administered  . Influenza, High Dose Seasonal PF 06/17/2015, 06/22/2016, 04/19/2017, 06/02/2018  . Pneumococcal Conjugate-13 07/30/2014  . Pneumococcal Polysaccharide-23 08/23/2008, 01/18/2018  . Tdap 07/30/2014    Qualifies for Shingles Vaccine? Yes  Zostavax completed n/a. Due for Shingrix. Education has been provided regarding the importance of this vaccine. Pt has been advised to call insurance company to determine out of pocket expense. Advised may also receive vaccine at local pharmacy or Health Dept. Verbalized acceptance and understanding.  Tdap: up to date   Flu Vaccine: Due for Flu vaccine. Does the patient want to receive this vaccine today?  Yes .   Pneumococcal Vaccine: up to date   Screening Tests Health Maintenance  Topic Date Due  . INFLUENZA VACCINE  03/24/2019  . TETANUS/TDAP  07/30/2024  . DEXA SCAN  Completed  . PNA vac Low Risk Adult  Completed    Cancer Screenings:  Colorectal Screening: no longer required  Mammogram: no longer required   Bone Density: Completed 06/17/2015  Lung Cancer Screening: (Low Dose CT Chest recommended if Age 55-80 years, 30 pack-year currently smoking OR have quit w/in 15years.) does not qualify.    Additional Screening:  Hepatitis C Screening: does not qualify  Vision Screening: Recommended annual ophthalmology exams for early detection of glaucoma and other disorders of the eye. Is the patient up to date with their annual eye exam?  Yes  Who is the provider or what is the name of the office in which the pt attends annual eye exams? [facility]   Dental Screening: Recommended annual dental exams for proper oral hygiene  Community Resource Referral:  CRR required this visit?  No       Plan:  I have personally reviewed and addressed the Medicare Annual Wellness questionnaire and have noted the following in the patient's chart:  A.  Medical and social history B. Use of alcohol, tobacco or illicit drugs  C. Current medications and supplements D. Functional ability and status E.  Nutritional status F.  Physical activity G. Advance directives H. List of other physicians I.  Hospitalizations, surgeries, and ER visits in previous 12 months J.  [place] such as hearing and vision if needed, cognitive and depression L. Referrals and appointments   In addition, I have reviewed and discussed with patient certain preventive protocols, quality metrics, and best practice recommendations. A written personalized care plan for preventive services as well as general preventive health recommendations were provided to patient.  Signed,    Bevelyn Ngo, LPN  9/[id number]/5369 Nurse Health Advisor   Nurse Notes: none

## 2019-05-09 LAB — BASIC METABOLIC PANEL WITH GFR
BUN/Creatinine Ratio: 22 (calc) (ref 6–22)
BUN: 27 mg/dL — ABNORMAL HIGH (ref 7–25)
CO2: 16 mmol/L — ABNORMAL LOW (ref 20–32)
Calcium: 10.1 mg/dL (ref 8.6–10.4)
Chloride: 109 mmol/L (ref 98–110)
Creat: 1.22 mg/dL — ABNORMAL HIGH (ref 0.60–0.88)
GFR, Est African American: 46 mL/min/{1.73_m2} — ABNORMAL LOW (ref >=60)
GFR, Est Non African American: 40 mL/min/{1.73_m2} — ABNORMAL LOW (ref >=60)
Glucose, Bld: 65 mg/dL (ref 65–99)
Potassium: 5 mmol/L (ref 3.5–5.3)
Sodium: 137 mmol/L (ref 135–146)

## 2019-05-14 ENCOUNTER — Encounter: Payer: Self-pay | Admitting: Nurse Practitioner

## 2019-05-14 ENCOUNTER — Telehealth: Payer: Self-pay

## 2019-05-14 ENCOUNTER — Ambulatory Visit: Payer: Self-pay | Admitting: Pharmacist

## 2019-05-14 ENCOUNTER — Encounter: Payer: Self-pay | Admitting: Pharmacist

## 2019-05-14 DIAGNOSIS — C50911 Malignant neoplasm of unspecified site of right female breast: Secondary | ICD-10-CM | POA: Insufficient documentation

## 2019-05-14 DIAGNOSIS — I4891 Unspecified atrial fibrillation: Secondary | ICD-10-CM

## 2019-05-14 NOTE — Chronic Care Management (AMB) (Signed)
  Chronic Care Management   Follow Up Note   05/14/2019 Name: Kristin [name] MRN: [id number] DOB: 22-May-1930  Referred by: Mikey College, NP Reason for referral : Chronic Care Management (Initial Patient Outreach Call)   Kristin [name] is a 85 y.o. year old female who is a primary care patient of Mikey College, NP. The CCM team was consulted for assistance with chronic disease management and care coordination needs, related to medication assistance for Eliquis.  Was unable to reach patient via telephone today and have left HIPAA compliant message asking patient to return my call.  Plan  The care management team will reach out to the patient again over the next 7 days.   Harlow Asa, PharmD, [place] [employer] [phone]

## 2019-05-14 NOTE — Patient Instructions (Signed)
Thank you allowing the Chronic Care Management Team to be a part of your care! It was a pleasure speaking with you today!     CCM (Chronic Care Management) Team    Janci Minor RN, BSN Nurse Care Coordinator  [phone]   Harlow Asa PharmD  Clinical Pharmacist  [phone]   Eula Fried LCSW Clinical Social Worker [phone]  Visit Information  Goals Addressed            This Visit's Progress   . [place] Assistance       Current Barriers:  . Financial Barriers - currently in coverage gap of Part D coverage  Pharmacist Clinical Goal(s):  Marland Kitchen Over the next 30 days, patient will work with CM Pharmacist to address needs related to medication assistance  Interventions: . Collaborate with [place] regarding patient's current out of pocket expenditure . Collaborate with patient's Cardiologist office . Counsel on the importance of medication adherence o Ms. Monds reports that she has ~10-15 days of her Eliquis remaining.  o States that she will be able to pick up a 30 day supply refill from the pharmacy when needed  . Review medication assistance options with Ms. Scalia o Based on reported income, patient not eligible for Extra Help Subsidy o Based on reported income and year-to-date out of pocket medication expenses, patient meets requirements for: - Patient Assistance Program for Eliquis through [employer] . Will collaborate with [facility] to assist patient with applying for patient assistance for Eliquis through [employer] . Review with Ms. Fede supporting documents that will be needed, including out of pocket expense reports from patient's pharmacy . Schedule appointment with patient to complete medication review . Review with patient available CM services. Patient denies need for other services at this time.  Patient Self Care Activities:  . Attends all scheduled provider appointments . Calls pharmacy for  medication refills . Calls provider office for new concerns or questions  Initial goal documentation        Ms. Moist was given information about Chronic Care Management services today including:  1. CCM service includes personalized support from designated clinical staff supervised by her physician, including individualized plan of care and coordination with other care providers 2. 24/7 contact phone numbers for assistance for urgent and routine care needs. 3. Service will only be billed when office clinical staff spend 20 minutes or more in a month to coordinate care. 4. Only one practitioner may furnish and bill the service in a calendar month. 5. The patient may stop CCM services at any time (effective at the end of the month) by phone call to the office staff. 6. The patient will be responsible for cost sharing (co-pay) of up to 20% of the service fee (after annual deductible is met).  Patient agreed to services and verbal consent obtained.   The patient verbalized understanding of instructions provided today and declined a print copy of patient instruction materials.   Telephone follow up appointment with care management team member scheduled for: 9/23 at 2 pm  Harlow Asa, PharmD, [place] [phone]

## 2019-05-14 NOTE — Assessment & Plan Note (Signed)
Patient has had cancer removed

## 2019-05-14 NOTE — Chronic Care Management (AMB) (Signed)
Chronic Care Management   Note  05/14/2019 Name: Kristin [name] MRN: [id number] DOB: 1929/12/25   Subjective:   Kristin [name] is a 85 y.o. year old female who is a primary care patient of Kristin College, NP. The CM team was consulted for assistance with chronic disease management and care coordination, related to medication assistance for Eliquis.  I reached out to Kristin [name] by phone today.   Kristin [name] was given information about Chronic Care Management services today including:  1. CCM service includes personalized support from designated clinical staff supervised by her physician, including individualized plan of care and coordination with other care providers 2. 24/7 contact phone numbers for assistance for urgent and routine care needs. 3. Service will only be billed when office clinical staff spend 20 minutes or more in a month to coordinate care. 4. Only one practitioner may furnish and bill the service in a calendar month. 5. The patient may stop CCM services at any time (effective at the end of the month) by phone call to the office staff. 6. The patient will be responsible for cost sharing (co-pay) of up to 20% of the service fee (after annual deductible is met).  Patient agreed to services and verbal consent obtained.   Review of patient status, including review of consultants reports, laboratory and other test data, was performed as part of comprehensive evaluation and provision of chronic care management services.   SDOH (Social Determinants of Health) screening performed today: Financial Strain . See Care Plan for related entries.   Objective:  Outpatient Encounter Medications as of 05/14/2019  Medication Sig Note  . apixaban (ELIQUIS) 5 MG TABS tablet Take 1 tablet (5 mg total) by mouth 2 (two) times daily.   Marland Kitchen acetaminophen (TYLENOL) 500 MG tablet Take 500 mg by mouth 3 (three) times daily.   Marland Kitchen albuterol (PROVENTIL) (2.5 MG/3ML) 0.083% nebulizer solution Take  3 mLs (2.5 mg total) by nebulization every 8 (eight) hours as needed for wheezing or shortness of breath.   . bisacodyl (DULCOLAX) 5 MG EC tablet Take 2 tablets (10 mg total) by mouth daily as needed for moderate constipation.   . Cholecalciferol (VITAMIN D) 2000 units tablet Take 2,000 Units by mouth daily.    . furosemide (LASIX) 20 MG tablet TAKE (1/2) TABLET BY MOUTH ONCE DAILY.   Marland Kitchen levocetirizine (XYZAL) 5 MG tablet Take 1 tablet (5 mg total) by mouth daily.   Marland Kitchen lisinopril (ZESTRIL) 10 MG tablet Take 1 tablet (10 mg total) by mouth daily.   . Melatonin 10 MG CAPS Take 20 mg by mouth at bedtime as needed.   . Oxycodone HCl 20 MG TABS Take 1 tablet (20 mg total) by mouth every 8 (eight) hours as needed. Must last 30 days   . [START ON 06/02/2019] Oxycodone HCl 20 MG TABS Take 1 tablet (20 mg total) by mouth every 8 (eight) hours as needed. Must last 30 days   . [START ON 07/02/2019] Oxycodone HCl 20 MG TABS Take 1 tablet (20 mg total) by mouth every 8 (eight) hours as needed. Must last 30 days   . OXYGEN Inhale 2 L into the lungs at bedtime.  04/25/2018: At night as needed  . polyethylene glycol (MIRALAX / GLYCOLAX) 17 g packet Take 17 g by mouth daily as needed for moderate constipation. Must last 30 days   . rOPINIRole (REQUIP) 2 MG tablet Take 2 tablets (4 mg total) by mouth at bedtime. Must last 30  days    No facility-administered encounter medications on file as of 05/14/2019.      Assessment:   Goals Addressed            This Visit's Progress   . [place] Assistance       Current Barriers:  . Financial Barriers - currently in coverage gap of Part D coverage  Pharmacist Clinical Goal(s):  Marland Kitchen Over the next 30 days, patient will work with CM Pharmacist to address needs related to medication assistance  Interventions: . Collaborate with [place] regarding patient's current out of pocket expenditure . Collaborate with patient's Cardiologist office . Counsel on  the importance of medication adherence o Kristin [name] reports that she has ~10-15 days of her Eliquis remaining.  o States that she will be able to pick up a 30 day supply refill from the pharmacy when needed  . Review medication assistance options with Kristin [name] o Based on reported income, patient not eligible for Extra Help Subsidy o Based on reported income and year-to-date out of pocket medication expenses, patient meets requirements for: - Patient Assistance Program for Eliquis through [employer] . Will collaborate with [facility] CPhT to assist patient with applying for patient assistance for Eliquis through [employer] . Review with Kristin [name] supporting documents that will be needed, including out of pocket expense reports from patient's pharmacy . Schedule appointment with patient to complete medication review . Review with patient available CM services. Patient denies need for other services at this time.  Patient Self Care Activities:  . Attends all scheduled provider appointments . Calls pharmacy for medication refills . Calls provider office for new concerns or questions  Initial goal documentation        Plan:  Telephone follow up appointment with care management team member scheduled for: 9/23 at 2 pm to complete medication review  Harlow Asa, PharmD, [place] [phone]

## 2019-05-15 ENCOUNTER — Other Ambulatory Visit: Payer: Self-pay | Admitting: Pharmacy Technician

## 2019-05-15 NOTE — Patient Outreach (Signed)
[facility]) Care Management  05/15/2019  FRANCIA VERRY 09-09-1929 [id number]                                      Medication Assistance Referral  Referral From: [facility] RPh)  Medication/Company: Eliquis / BMS Patient application portion:  Mailed Provider application portion: Faxed  to Dr. Peggye Form Provider address/fax verified via: Office website    Follow up:  Will follow up with patient in 5-10 business days to confirm application(s) have been received.  Yehudit Fulginiti P. Tryniti Laatsch, [place] Management [phone]

## 2019-05-16 ENCOUNTER — Ambulatory Visit (INDEPENDENT_AMBULATORY_CARE_PROVIDER_SITE_OTHER): Payer: Medicare Other | Admitting: Pharmacist

## 2019-05-16 DIAGNOSIS — I4891 Unspecified atrial fibrillation: Secondary | ICD-10-CM

## 2019-05-16 DIAGNOSIS — G4700 Insomnia, unspecified: Secondary | ICD-10-CM

## 2019-05-16 DIAGNOSIS — I4892 Unspecified atrial flutter: Secondary | ICD-10-CM | POA: Diagnosis not present

## 2019-05-16 NOTE — Patient Instructions (Addendum)
Thank you allowing the Chronic Care Management Team to be a part of your care! It was a pleasure speaking with you today!     CCM (Chronic Care Management) Team    Janci Minor RN, BSN Nurse Care Coordinator  [phone]   Harlow Asa PharmD  Clinical Pharmacist  [phone]   Eula Fried LCSW Clinical Social Worker [phone]  Visit Information  Goals Addressed            This Visit's Progress   . PharmD - [place]       Current Barriers:  . Financial Barriers - currently in coverage gap of Part D coverage  Pharmacist Clinical Goal(s):  Marland Kitchen Over the next 30 days, patient will work with CM Pharmacist to address needs related to medication assistance  Interventions: . Comprehensive medication review performed; medication list updated in electronic medical record o Renal dosing recommendations (based on calculated CrCl ~38 mL/min): - Package insert for Xyzal recommends dose reduction to 2.5 mg once every other day for CrCl 30-50 mL/min o Counsel on risk of dizziness, sedation and falls with use of oxycodone, particularly when used in combination with other CNS depressants such as ropinirole and levocetirizine. - Ms. Kinsel denies any sedation or dizziness. Denies any falls in the past year. - Patient seen by [facility] o Note melatonin dose higher than usual recommended range. Note as a supplement, data is limited on clear dosage recommendations and evidence. Also, inter-product variability is a concern. - Ms. Wentling verbalizes understanding and states that she is going to try reducing down to 1 capsule (10 mg) QHS as needed . [name] is cut short as patient states that her ride has come and she must go . Note last LDL drawn 03/02/16 was elevated, 131 mg/dL. Based on patient's age and risk factors, use of a moderate-intensity statin may be reasonable.  . Continue to collaborate with [facility] CPhT to assist patient with applying for  patient assistance for Eliquis through [employer] . Will collaborate with PCP   Patient Self Care Activities:  . Attends all scheduled provider appointments . Calls pharmacy for medication refills . Calls provider office for new concerns or questions  Please see past updates related to this goal by clicking on the "Past Updates" button in the selected goal         The patient verbalized understanding of instructions provided today and declined a print copy of patient instruction materials.   The care management team will reach out to the patient again over the next 14 days.   Harlow Asa, PharmD, [place] [employer] [phone]

## 2019-05-16 NOTE — Chronic Care Management (AMB) (Deleted)
Chronic Care Management   Follow Up Note   05/16/2019 Name: Kristin [name] MRN: [id number] DOB: June 12, 1930  Referred by: Kristin [name] Reason for referral : No chief complaint on file.   Kristin [name] is a 85 y.o. year old female who is a primary care patient of Kristin [name]. The CCM team was consulted for assistance with chronic disease management and care coordination needs.  Kristin [name] has a past medical history including but not limited to hypertension, CKD, Chronic diastolic CHF, COPD, atrial fibrillation, chronic pain, restless leg syndrome and insomnia.  I reached out to Kristin [name] by phone today.   Review of patient status, including review of consultants reports, relevant laboratory and other test results, and collaboration with appropriate care team members and the patient's provider was performed as part of comprehensive patient evaluation and provision of chronic care management services.     Outpatient Encounter Medications as of 05/16/2019  Medication Sig Note  . acetaminophen (TYLENOL) 500 MG tablet Take 500 mg by mouth 3 (three) times daily.   Marland Kitchen albuterol (PROVENTIL) (2.5 MG/3ML) 0.083% nebulizer solution Take 3 mLs (2.5 mg total) by nebulization every 8 (eight) hours as needed for wheezing or shortness of breath.   Marland Kitchen apixaban (ELIQUIS) 5 MG TABS tablet Take 1 tablet (5 mg total) by mouth 2 (two) times daily.   . bisacodyl (DULCOLAX) 5 MG EC tablet Take 2 tablets (10 mg total) by mouth daily as needed for moderate constipation.   . Cholecalciferol (VITAMIN D) 2000 units tablet Take 2,000 Units by mouth daily.    . furosemide (LASIX) 20 MG tablet TAKE (1/2) TABLET BY MOUTH ONCE DAILY.   Marland Kitchen levocetirizine (XYZAL) 5 MG tablet Take 1 tablet (5 mg total) by mouth daily.   Marland Kitchen lisinopril (ZESTRIL) 10 MG tablet Take 1 tablet (10 mg total) by mouth daily.   . Melatonin 10 MG CAPS Take 20 mg by mouth at bedtime as needed.   . Oxycodone HCl 20 MG TABS Take 1  tablet (20 mg total) by mouth every 8 (eight) hours as needed. Must last 30 days   . polyethylene glycol (MIRALAX / GLYCOLAX) 17 g packet Take 17 g by mouth daily as needed for moderate constipation. Must last 30 days   . rOPINIRole (REQUIP) 2 MG tablet Take 2 tablets (4 mg total) by mouth at bedtime. Must last 30 days   . [START ON 06/02/2019] Oxycodone HCl 20 MG TABS Take 1 tablet (20 mg total) by mouth every 8 (eight) hours as needed. Must last 30 days   . [START ON 07/02/2019] Oxycodone HCl 20 MG TABS Take 1 tablet (20 mg total) by mouth every 8 (eight) hours as needed. Must last 30 days   . OXYGEN Inhale 2 L into the lungs at bedtime.  04/25/2018: At night as needed   No facility-administered encounter medications on file as of 05/16/2019.     Goals Addressed            This Visit's Progress   . PharmD - Kristin [name]       Current Barriers:  . Financial Barriers - currently in coverage gap of Part D coverage  Pharmacist Clinical Goal(s):  Marland Kitchen Over the next 30 days, patient will work with CM Pharmacist to address needs related to medication assistance  Interventions: . Comprehensive medication review performed; medication list updated in electronic medical record o Renal dosing recommendations (based on calculated CrCl ~38 mL/min): - Package  insert for Xyzal recommends dose reduction to 2.5 mg once every other day for CrCl 30-50 mL/min o Counsel on risk of dizziness, sedation and falls with use of oxycodone, particularly when used in combination with other CNS depressants such as ropinirole and levocetirizine. - Kristin [name] denies any sedation or dizziness. Denies any falls in the past year. - [name] o Note melatonin dose higher than usual recommended range. Kristin [name] reports that she has discussed this with pain management physician and that he manages this for her. This is a non-FDA approved supplement, with inter-product variability and  limitations on safety data. Kristin [name] is cut short as patient states that her ride has come and she must go . Continue to collaborate with [name] CPhT to assist patient with applying for patient assistance for Eliquis through [employer] . Will collaborate with PCP   Patient Self Care Activities:  . Attends all scheduled provider appointments . Calls pharmacy for medication refills . Calls provider office for new concerns or questions  Please see past updates related to this goal by clicking on the "Past Updates" button in the selected goal         Plan  The care management team will reach out to the patient again over the next 7 days.   Harlow Asa, PharmD, [place] [employer] [phone]

## 2019-05-17 ENCOUNTER — Other Ambulatory Visit: Payer: Self-pay | Admitting: Nurse Practitioner

## 2019-05-17 ENCOUNTER — Ambulatory Visit: Payer: Self-pay | Admitting: Pharmacist

## 2019-05-17 DIAGNOSIS — E785 Hyperlipidemia, unspecified: Secondary | ICD-10-CM

## 2019-05-17 DIAGNOSIS — J302 Other seasonal allergic rhinitis: Secondary | ICD-10-CM

## 2019-05-17 MED ORDER — LEVOCETIRIZINE DIHYDROCHLORIDE 5 MG PO TABS: 2.5000 mg | ORAL_TABLET | ORAL | 4 refills | Status: DC

## 2019-05-17 NOTE — Chronic Care Management (AMB) (Signed)
Chronic Care Management   Follow Up Note   05/17/2019 Name: BREE HEINZELMAN MRN: [id number] DOB: 1930/06/24  Referred by: Mikey College, NP Reason for referral : Chronic Care Management (Patient Phone Call)   KIANTE PETROVICH is a 85 y.o. year old female who is a primary care patient of Mikey College, NP. The CCM team was consulted for assistance with chronic disease management and care coordination needs.  Ms. Schuld has a past medical history including but not limited to hypertension, CKD, Chronic heart failure with preserved ejection fraction, COPD, atrial fibrillation, seasonal allergies, chronic pain, restless leg syndrome and insomnia.  I reached out to [name] by phone today.   Review of patient status, including review of consultants reports, relevant laboratory and other test results, and collaboration with appropriate care team members and the patient's provider was performed as part of comprehensive patient evaluation and provision of chronic care management services.      Outpatient Encounter Medications as of 05/17/2019  Medication Sig Note  . acetaminophen (TYLENOL) 500 MG tablet Take 500 mg by mouth 3 (three) times daily.   Marland Kitchen albuterol (PROVENTIL) (2.5 MG/3ML) 0.083% nebulizer solution Take 3 mLs (2.5 mg total) by nebulization every 8 (eight) hours as needed for wheezing or shortness of breath.   Marland Kitchen apixaban (ELIQUIS) 5 MG TABS tablet Take 1 tablet (5 mg total) by mouth 2 (two) times daily.   . bisacodyl (DULCOLAX) 5 MG EC tablet Take 2 tablets (10 mg total) by mouth daily as needed for moderate constipation.   . Cholecalciferol (VITAMIN D) 2000 units tablet Take 2,000 Units by mouth daily.    . furosemide (LASIX) 20 MG tablet TAKE (1/2) TABLET BY MOUTH ONCE DAILY.   Marland Kitchen levocetirizine (XYZAL) 5 MG tablet Take 0.5 tablets (2.5 mg total) by mouth every other day.   . lisinopril (ZESTRIL) 10 MG tablet Take 1 tablet (10 mg total) by mouth daily.   . Melatonin 10 MG  CAPS Take 20 mg by mouth at bedtime as needed. (Patient taking differently: Take 10 mg by mouth at bedtime as needed. )   . Oxycodone HCl 20 MG TABS Take 1 tablet (20 mg total) by mouth every 8 (eight) hours as needed. Must last 30 days   . [START ON 06/02/2019] Oxycodone HCl 20 MG TABS Take 1 tablet (20 mg total) by mouth every 8 (eight) hours as needed. Must last 30 days   . [START ON 07/02/2019] Oxycodone HCl 20 MG TABS Take 1 tablet (20 mg total) by mouth every 8 (eight) hours as needed. Must last 30 days   . OXYGEN Inhale 2 L into the lungs at bedtime.  04/25/2018: At night as needed  . polyethylene glycol (MIRALAX / GLYCOLAX) 17 g packet Take 17 g by mouth daily as needed for moderate constipation. Must last 30 days   . rOPINIRole (REQUIP) 2 MG tablet Take 2 tablets (4 mg total) by mouth at bedtime. Must last 30 days    No facility-administered encounter medications on file as of 05/17/2019.     Goals Addressed            This Visit's Progress   . PharmD - [place]       Current Barriers:  . Financial Barriers - currently in coverage gap of Part D coverage  Pharmacist Clinical Goal(s):  Marland Kitchen Over the next 30 days, patient will work with CM Pharmacist to address needs related to medication assistance  Interventions: .  Collaborate with PCP regarding renal dosing for Xyzal and consideration of use of moderate-intensity for management of hyperlipidemia o PCP agrees with dose reduction of Xyzal to 2.5 mg every other day. Prescription updated in chart o PCP has previously discussed LDL goals and management with patient in past and will address again at future visit . Follow up with Ms. Pickerel o Advise Ms Ballinas to reduce Xyzal 5 mg dose to 1/2 tablet every OTHER day. Patient verbalizes understanding by teach-back method o Encourage patient to use weekly pillbox to aid with adherence . Will continue to collaborate with [facility] CPhT to assist patient with applying for patient  assistance for Eliquis through [employer] o Patient to check mail for application  Patient Self Care Activities:  . Attends all scheduled provider appointments . Calls pharmacy for medication refills . Calls provider office for new concerns or questions  Please see past updates related to this goal by clicking on the "Past Updates" button in the selected goal         Plan  The care management team will reach out to the patient again over the next 14 days.   Harlow Asa, PharmD, [place] [employer] [phone]

## 2019-05-17 NOTE — Chronic Care Management (AMB) (Addendum)
Chronic Care Management   Note  05/17/2019 Name: Kristin [name] MRN: [id number] DOB: 1930-01-20   Subjective:   Kristin [name] is a 85 y.o. year old female who is a primary care patient of Mikey College, NP. The CM team was consulted for assistance with chronic disease management and care coordination. [name] has a past medical history including but not limited to hypertension, CKD, Chronic heart failure with preserved ejection fraction, COPD, atrial fibrillation, seasonal allergies, chronic pain, restless leg syndrome and insomnia.  I reached out to Kristin [name] by phone today as scheduled to complete medication review.  Review of patient status, including review of consultants reports, laboratory and other test data, was performed as part of comprehensive evaluation and provision of chronic care management services.   Objective:  Lab Results  Component Value Date   CREATININE 1.22 (H) 05/08/2019   CREATININE 1.49 (H) 07/25/2018   CREATININE 1.09 (H) 08/22/2017    No results found for: HGBA1C     Component Value Date/Time   CHOL 204 (H) 03/02/2016 0901   TRIG 76 03/02/2016 0901   HDL 58 03/02/2016 0901   CHOLHDL 3.5 03/02/2016 0901   VLDL 15 03/02/2016 0901   LDLCALC 131 (H) 03/02/2016 0901     BP Readings from Last 3 Encounters:  05/08/19 118/72  05/08/19 118/72  03/27/19 (!) 175/95    Allergies  Allergen Reactions  . Flexeril [Cyclobenzaprine Hcl]     Severe Rash  . Vancomycin Rash    [place] Syndrome    Medications Reviewed Today    Reviewed by Vella Raring, [place] (Pharmacist) on 05/16/19 at [place] List Status: <None>  Medication Order Taking? Sig Documenting Provider Last Dose Status Informant  acetaminophen (TYLENOL) 500 MG tablet [id number] Yes Take 500 mg by mouth 3 (three) times daily. [provider] Taking Active   albuterol (PROVENTIL) (2.5 MG/3ML) 0.083% nebulizer solution [id number] Yes Take 3 mLs (2.5 mg total) by  nebulization every 8 (eight) hours as needed for wheezing or shortness of breath. Milinda Pointer, MD Taking Active   apixaban (ELIQUIS) 5 MG TABS tablet [id number] Yes Take 1 tablet (5 mg total) by mouth 2 (two) times daily. Loletha Grayer, MD Taking Active Family Member           Med Note [facility] A   Mon May 14, 2019  3:33 PM)    bisacodyl (DULCOLAX) 5 MG EC tablet [id number] Yes Take 2 tablets (10 mg total) by mouth daily as needed for moderate constipation. Milinda Pointer, MD Taking Active   Cholecalciferol (VITAMIN D) 2000 units tablet [id number] Yes Take 2,000 Units by mouth daily.  [provider] Taking Active Family Member  furosemide (LASIX) 20 MG tablet [id number] Yes TAKE (1/2) TABLET BY MOUTH ONCE DAILY. Mikey College, NP Taking Active   levocetirizine (XYZAL) 5 MG tablet [id number] Yes Take 1 tablet (5 mg total) by mouth daily. Mikey College, NP Taking Active   lisinopril (ZESTRIL) 10 MG tablet [id number] Yes Take 1 tablet (10 mg total) by mouth daily. Mikey College, NP Taking Active   Melatonin 10 MG CAPS [id number] Yes Take 20 mg by mouth at bedtime as needed. Milinda Pointer, MD Taking Active   Oxycodone HCl 20 MG TABS [id number] Yes Take 1 tablet (20 mg total) by mouth every 8 (eight) hours as needed. Must last 30 days Milinda Pointer, MD Taking Active   Oxycodone HCl 20 MG TABS [id number]  Take 1  tablet (20 mg total) by mouth every 8 (eight) hours as needed. Must last 30 days Milinda Pointer, MD  Active   Oxycodone HCl 20 MG TABS [id number]  Take 1 tablet (20 mg total) by mouth every 8 (eight) hours as needed. Must last 30 days Milinda Pointer, MD  Active   OXYGEN [id number]  Inhale 2 L into the lungs at bedtime.  [provider]  Active Family Member           Med Note (HILL, TIFFANY A   Tue Apr 25, 2018  3:19 PM) At night as needed  polyethylene glycol (MIRALAX / GLYCOLAX) 17 g packet [id number] Yes Take 17 g by mouth  daily as needed for moderate constipation. Must last 30 days Milinda Pointer, MD Taking Active   rOPINIRole (REQUIP) 2 MG tablet [id number] Yes Take 2 tablets (4 mg total) by mouth at bedtime. Must last 30 days Milinda Pointer, MD Taking Active   Med List Note Landis Martins, [place] 10/31/18 1223):  UDS 02/01/2018 MR 02-02-19 Received permission from Dr Saralyn Pilar to stop Eliquis for 3 days on 11-24-15.  Pt notified Hydrocodone x2 post RF on 02/09/2018.             Assessment:   Goals Addressed            This Visit's Progress   . PharmD - [place]       Current Barriers:  . Financial Barriers - currently in coverage gap of Part D coverage  Pharmacist Clinical Goal(s):  Marland Kitchen Over the next 30 days, patient will work with CM Pharmacist to address needs related to medication assistance  Interventions: . Comprehensive medication review performed; medication list updated in electronic medical record o Renal dosing recommendations (based on calculated CrCl ~38 mL/min): - Package insert for Xyzal recommends dose reduction to 2.5 mg once every other day for CrCl 30-50 mL/min o Counsel on risk of dizziness, sedation and falls with use of oxycodone, particularly when used in combination with other CNS depressants such as ropinirole and levocetirizine. - [name] denies any sedation or dizziness. Denies any falls in the past year. - Patient seen by [facility] o Note melatonin dose higher than usual recommended range. Note as a supplement, data is limited on clear dosage recommendations and evidence. Also, inter-product variability is a concern. - [name] verbalizes understanding and states that she is going to try reducing down to 1 capsule (10 mg) QHS as needed . Counsel on importance of medication adherence.  o Recommend patient start using weekly pillbox as adherence tool.  [name] is cut short as patient states that her ride has come and she must  go . Note last LDL drawn 03/02/16 was elevated, 131 mg/dL. Based on patient's age and risk factors, use of a moderate-intensity statin may be reasonable.  . Continue to collaborate with [facility] CPhT to assist patient with applying for patient assistance for Eliquis through [employer] . Will collaborate with PCP   Patient Self Care Activities:  . Attends all scheduled provider appointments . Calls pharmacy for medication refills . Calls provider office for new concerns or questions  Please see past updates related to this goal by clicking on the "Past Updates" button in the selected goal         Plan:  The care management team will reach out to the patient again over the next 14 days.   Harlow Asa, PharmD, [place]  [facility] [employer] [phone]

## 2019-05-17 NOTE — Patient Instructions (Signed)
Thank you allowing the Chronic Care Management Team to be a part of your care! It was a pleasure speaking with you today!     CCM (Chronic Care Management) Team    Janci Minor RN, BSN Nurse Care Coordinator  [phone]   Harlow Asa PharmD  Clinical Pharmacist  [phone]   Eula Fried LCSW Clinical Social Worker [phone]  Visit Information  Goals Addressed            This Visit's Progress   . PharmD - [place]       Current Barriers:  . Financial Barriers - currently in coverage gap of Part D coverage  Pharmacist Clinical Goal(s):  Marland Kitchen Over the next 30 days, patient will work with CM Pharmacist to address needs related to medication assistance  Interventions: . Collaborate with PCP regarding renal dosing for Xyzal and consideration of use of moderate-intensity for management of hyperlipidemia o PCP agrees with dose reduction of Xyzal to 2.5 mg every other day. Prescription updated in chart o PCP has previously discussed LDL goals and management with patient in past and will address again at future visit . Follow up with Ms. Jean o Advise Ms Mcclintic to reduce Xyzal 5 mg dose to 1/2 tablet every OTHER day. Patient verbalizes understanding by teach-back method o Encourage patient to use weekly pillbox to aid with adherence . Will continue to collaborate with [facility] CPhT to assist patient with applying for patient assistance for Eliquis through [employer] o Patient to check mail for application  Patient Self Care Activities:  . Attends all scheduled provider appointments . Calls pharmacy for medication refills . Calls provider office for new concerns or questions  Please see past updates related to this goal by clicking on the "Past Updates" button in the selected goal         The patient verbalized understanding of instructions provided today and declined a print copy of patient instruction materials.   The care management  team will reach out to the patient again over the next 14 days.   Harlow Asa, PharmD, [place] [employer] [phone]

## 2019-05-23 ENCOUNTER — Other Ambulatory Visit: Payer: Self-pay | Admitting: Pharmacy Technician

## 2019-05-23 NOTE — Patient Outreach (Signed)
[place] [facility]) Care Management  05/23/2019  LILIA LETTERMAN April 21, 1930 [id number]  Successful outreach call placed to patient in regards to BMS application for Eliquis.  Spoke to patient, HIPAA identifiers verified.  Patient informed she received the applications. She informed she has no questions. Informed patient to make sure she looks over the check list on the letter. She informed she was able to obtain the OOP spend from her pharmacy. She informed she will try and get it mailed back today.   Will followup with patient in 10-14 business days if application has not been received back.  Rage Beever P. Ashby Moskal, [place] Management [phone]

## 2019-05-29 ENCOUNTER — Other Ambulatory Visit: Payer: Self-pay | Admitting: Pharmacy Technician

## 2019-05-29 NOTE — Patient Outreach (Signed)
[place] [facility]) Care Management  05/29/2019  SAMANI DEAL 1930/01/27 [id number]    Received all necessary paperwork and signatures from both patient and provider for BMS patient assistance for Eliquis.  Submitted completed paperwork via fax to BMS.  Will followup with BMS in 5-10 business days to inquire on status of application.  Deldrick Linch P. Jagger Beahm, [place] Management [phone]

## 2019-05-30 ENCOUNTER — Ambulatory Visit: Payer: Self-pay | Admitting: Pharmacist

## 2019-05-30 DIAGNOSIS — Z598 Other problems related to housing and economic circumstances: Secondary | ICD-10-CM

## 2019-05-30 DIAGNOSIS — Z599 Problem related to housing and economic circumstances, unspecified: Secondary | ICD-10-CM

## 2019-05-30 NOTE — Patient Instructions (Signed)
Thank you allowing the Chronic Care Management Team to be a part of your care! It was a pleasure speaking with you today!     CCM (Chronic Care Management) Team    Janci Minor RN, BSN Nurse Care Coordinator  [phone]   Harlow Asa PharmD  Clinical Pharmacist  [phone]   Eula Fried LCSW Clinical Social Worker [phone]  Visit Information  Goals Addressed            This Visit's Progress   . PharmD - [place]       Current Barriers:  . Financial Barriers - currently in coverage gap of Part D coverage  Pharmacist Clinical Goal(s):  Marland Kitchen Over the next 30 days, patient will work with CM Pharmacist to address needs related to medication assistance  Interventions: . Follow up with patient regarding importance of medication adherence o Ms Hogan confirms having reduced Xyzal 5 mg dose to 1/2 tablet every OTHER day. o Again encourage patient to use weekly pillbox to aid with adherence - Ms. Denning reports that she has used up her over the counter benefit for this quarter, but states that she will look into obtaining one from her pharmacy . Will continue to collaborate with [facility] CPhT to assist patient with applying for patient assistance for Eliquis through [employer] o Per note in chart, [facility] CPhT has received assistance program paperwork from patient and PCP and faxed both parts to BMS yesterday . Will send a referral to [facility] for resources for patient.   Patient Self Care Activities:  . Attends all scheduled provider appointments . Calls pharmacy for medication refills . Calls provider office for new concerns or questions  Please see past updates related to this goal by clicking on the "Past Updates" button in the selected goal         The patient verbalized understanding of instructions provided today and declined a print copy of patient instruction materials.   The care management team will reach out to the patient again over the  next 30 days.   Harlow Asa, PharmD, [place] [employer] [phone]

## 2019-05-30 NOTE — Chronic Care Management (AMB) (Signed)
Chronic Care Management   Follow Up Note   05/30/2019 Name: Kristin [name] MRN: [id number] DOB: [birth year]  Referred by: Mikey College, NP Reason for referral : Chronic Care Management (Patient Phone Call)   VAIL BASISTA is a 85 y.o. year old female who is a primary care patient of Mikey College, NP. The CCM team was consulted for assistance with chronic disease management and care coordination needs.  Ms. Goatley has a past medical history including but not limited to hypertension, CKD, Chronic heart failure with preserved ejection fraction, COPD, atrial fibrillation, seasonal allergies, chronic pain, restless leg syndrome and insomnia.  I reached out to [name] by phone today.   Review of patient status, including review of consultants reports, relevant laboratory and other test results, and collaboration with appropriate care team members and the patient's provider was performed as part of comprehensive patient evaluation and provision of chronic care management services.    SDOH (Social Determinants of Health) screening performed today: Armed forces logistics/support/administrative officer . See Care Plan for related entries.  Outpatient Encounter Medications as of 05/30/2019  Medication Sig Note  . levocetirizine (XYZAL) 5 MG tablet Take 0.5 tablets (2.5 mg total) by mouth every other day.   Marland Kitchen acetaminophen (TYLENOL) 500 MG tablet Take 500 mg by mouth 3 (three) times daily.   Marland Kitchen albuterol (PROVENTIL) (2.5 MG/3ML) 0.083% nebulizer solution Take 3 mLs (2.5 mg total) by nebulization every 8 (eight) hours as needed for wheezing or shortness of breath.   Marland Kitchen apixaban (ELIQUIS) 5 MG TABS tablet Take 1 tablet (5 mg total) by mouth 2 (two) times daily.   . bisacodyl (DULCOLAX) 5 MG EC tablet Take 2 tablets (10 mg total) by mouth daily as needed for moderate constipation.   . Cholecalciferol (VITAMIN D) 2000 units tablet Take 2,000 Units by mouth daily.    . furosemide (LASIX) 20 MG tablet TAKE  (1/2) TABLET BY MOUTH ONCE DAILY.   Marland Kitchen lisinopril (ZESTRIL) 10 MG tablet Take 1 tablet (10 mg total) by mouth daily.   . Melatonin 10 MG CAPS Take 20 mg by mouth at bedtime as needed. (Patient taking differently: Take 10 mg by mouth at bedtime as needed. )   . Oxycodone HCl 20 MG TABS Take 1 tablet (20 mg total) by mouth every 8 (eight) hours as needed. Must last 30 days   . [START ON 06/02/2019] Oxycodone HCl 20 MG TABS Take 1 tablet (20 mg total) by mouth every 8 (eight) hours as needed. Must last 30 days   . [START ON 07/02/2019] Oxycodone HCl 20 MG TABS Take 1 tablet (20 mg total) by mouth every 8 (eight) hours as needed. Must last 30 days   . OXYGEN Inhale 2 L into the lungs at bedtime.  04/25/2018: At night as needed  . polyethylene glycol (MIRALAX / GLYCOLAX) 17 g packet Take 17 g by mouth daily as needed for moderate constipation. Must last 30 days   . rOPINIRole (REQUIP) 2 MG tablet Take 2 tablets (4 mg total) by mouth at bedtime. Must last 30 days    No facility-administered encounter medications on file as of 05/30/2019.     Goals Addressed            This Visit's Progress   . PharmD - [place]       Current Barriers:  . Financial Barriers - currently in coverage gap of Part D coverage  Pharmacist Clinical Goal(s):  Marland Kitchen Over the  next 30 days, patient will work with CM Pharmacist to address needs related to medication assistance  Interventions: . Follow up with patient regarding importance of medication adherence o Ms Favela confirms having reduced Xyzal 5 mg dose to 1/2 tablet every OTHER day. o Again encourage patient to use weekly pillbox to aid with adherence - Ms. Hoagland reports that she has used up her over the counter benefit for this quarter, but states that she will look into obtaining one from her pharmacy . Will continue to collaborate with [facility] to assist patient with applying for patient assistance for Eliquis through [employer] o Per note in  chart, [facility] has received assistance program paperwork from patient and PCP and faxed both parts to [facility] yesterday . Will send a referral to [facility] for resources for patient. Ms. Arrazola reports sometimes having difficutly with obtaining enough to eat  Patient Self Care Activities:  . Attends all scheduled provider appointments . Calls pharmacy for medication refills . Calls provider office for new concerns or questions  Please see past updates related to this goal by clicking on the "Past Updates" button in the selected goal        Plan  The care management team will reach out to the patient again over the next 30 days.   Harlow Asa, PharmD, [place] [employer] [phone]

## 2019-05-31 ENCOUNTER — Telehealth: Payer: Self-pay | Admitting: *Deleted

## 2019-05-31 NOTE — Telephone Encounter (Signed)
Attempted to call for pre appointment med/allergy review. Unable to leave a message.

## 2019-06-01 ENCOUNTER — Encounter: Payer: Self-pay | Admitting: Pain Medicine

## 2019-06-03 NOTE — Progress Notes (Signed)
Pain Management Virtual Encounter Note - Virtual Visit via Telephone Telehealth (real-time audio visits between healthcare provider and patient).   Patient's Phone No. & Preferred Pharmacy:  [phone] (home); There is no such number on file (mobile).; (Preferred) [phone] [email or web address]  [place], [place] - [address] [place] [address] Phone: [phone] Fax: [phone]    Pre-screening note:  Our staff contacted Ms. Alcivar and offered her an "in person", "face-to-face" appointment versus a telephone encounter. She indicated preferring the telephone encounter, at this time.   Reason for Virtual Visit: COVID-19*  Social distancing based on CDC and AMA recommendations.   I contacted [name] on 06/04/2019 via telephone.      I clearly identified myself as Gaspar Cola, MD. I verified that I was speaking with the correct person using two identifiers (Name: KAILIE POLUS, and date of birth: 08/31/1929).  Advanced Informed Consent I sought verbal advanced consent from [name] for virtual visit interactions. I informed Ms. Speyer of possible security and privacy concerns, risks, and limitations associated with providing "not-in-person" medical evaluation and management services. I also informed Ms. Pollak of the availability of "in-person" appointments. Finally, I informed her that there would be a charge for the virtual visit and that she could be  personally, fully or partially, financially responsible for it. Ms. Mahan expressed understanding and agreed to proceed.   Historic Elements   Ms. SEVEN MARENGO is a 85 y.o. year old, female patient evaluated today after her last encounter by our practice on 05/31/2019. Ms. Woodring  has a past medical history of Acute postoperative pain (02/09/2018), Arrhythmia, sinus node (04/03/2014), Arthritis, Arthritis, Atrial fibrillation ([place]), Back pain, Bradycardia, Breast  cancer ([place]) (2004), Cancer ([place]) (2004), CHF (congestive heart failure) ([place]), Chronic back pain, CKD (chronic kidney disease), COPD (chronic obstructive pulmonary disease) ([place]), Cystocele, Decubitus ulcers, Dehydration, Gout, Hematuria, Hepatitis C, Hyperlipidemia, Hypertension, Hypomagnesemia (06/25/2015), OAB (overactive bladder), Overactive bladder, Restless leg, Shoulder pain, bilateral, Urinary frequency, UTI (lower urinary tract infection), and Vaginal atrophy. She also  has a past surgical history that includes Abdominal hysterectomy; Back surgery; Rotator cuff repair; Breast surgery; Cataract extraction w/PHACO (10/19/2011); Cataract extraction w/PHACO (11/02/2011); Carpal tunnel release; Joint replacement; Colon surgery; Pacemaker insertion (N/A, 12/24/2015); Cholecystectomy; Breast lumpectomy (Right, 2004); and Insert / replace / remove pacemaker. Ms. Ishikawa has a current medication list which includes the following prescription(s): acetaminophen, albuterol, apixaban, bisacodyl, vitamin d, furosemide, levocetirizine, lisinopril, melatonin, oxycodone hcl, oxycodone hcl, oxycodone hcl, oxygen-helium, polyethylene glycol, ropinirole, NONFORMULARY OR COMPOUNDED ITEM, and benefiber. She  reports that she quit smoking about 20 years ago. Her smoking use included cigarettes. She has a 50.00 pack-year smoking history. She has never used smokeless tobacco. She reports that she does not drink alcohol or use drugs. Ms. Massmann is allergic to flexeril [cyclobenzaprine hcl] and vancomycin.   HPI  Today, she is being contacted for medication management.  The patient indicates doing well with the current medication regimen. No adverse reactions or side effects reported to the medications.  Due to the fact that she is on a blood thinner (Eliquis) she is not a good candidate to be on any nonsteroidal anti-inflammatory drug.  However, she could benefit from some type of anti-inflammatory treatment since she does continue to  complain about arthritis in her hands as well as all her joints in her body.  Today her primary concern is that for lower back pain which seems to have  flared up again.  She indicates that the left is worse than the right and she is currently having pain on her left hip and right shoulder.  She would like for [name] to inject her lower back and left hip to see if this can provide her with some relief of the pain and although she is having right shoulder pain, she says that she can deal with that for now.  I will be ordering a compounded cream to see if this can help the patient with some of this pain in her hands then all other joints.  The patient was warned to be careful not to touch her eyes or mouth after having applied the cream to her hands.  She indicated having understood.  I will be prescribing a compounded cream containing: 2.5% Lidocaine, 10% Ketamine, 10% Ketoprofen, & 6% Gabapentin.  In addition, will schedule her to come back for a lumbar facet, SI, and hip injection on the left side.  Pharmacotherapy Assessment  Analgesic: Oxycodone IR 20 mg, 1 tab PO q 8 hrs (60 mg/day of oxycodone) MME/day: 90 mg/day.   Monitoring: Pharmacotherapy: No side-effects or adverse reactions reported.  PMP: PDMP reviewed during this encounter.       Compliance: No problems identified. Effectiveness: Clinically acceptable. Plan: Refer to "POC".  UDS:  Summary  Date Value Ref Range Status  02/01/2018 FINAL  Final    Comment:    ==================================================================== TOXASSURE SELECT 13 (MW) ==================================================================== Test                             Result       Flag       Units Drug Present and Declared for Prescription Verification   Oxycodone                      401          EXPECTED   ng/mg creat   Oxymorphone                    438          EXPECTED   ng/mg creat   Noroxycodone                   1122         EXPECTED    ng/mg creat   Noroxymorphone                 309          EXPECTED   ng/mg creat    Sources of oxycodone are scheduled prescription medications.    Oxymorphone, noroxycodone, and noroxymorphone are expected    metabolites of oxycodone. Oxymorphone is also available as a    scheduled prescription medication. ==================================================================== Test                      Result    Flag   Units      Ref Range   Creatinine              79               mg/dL      >=20 ==================================================================== Declared Medications:  The flagging and interpretation on this report are based on the  following declared medications.  Unexpected results may arise from  inaccuracies in the declared medications.  **Note: The testing scope of this panel  includes these medications:  Oxycodone  **Note: The testing scope of this panel does not include following  reported medications:  Albuterol (Proventil)  Apixaban  Bisacodyl  Furosemide (Lasix)  Lisinopril  Melatonin  Polyethylene Glycol (MiraLAX)  Ropinirole  Vitamin D ==================================================================== For clinical consultation, please call [phone]. ====================================================================    Laboratory Chemistry Profile (12 mo)  Renal: 05/08/2019: BUN 27; BUN/Creatinine Ratio 22; Creat 1.22  Lab Results  Component Value Date   GFRAA 46 (L) 05/08/2019   GFRNONAA 40 (L) 05/08/2019   Hepatic: No results found for requested labs within last 8760 hours. Lab Results  Component Value Date   AST 21 07/25/2018   ALT 11 07/25/2018   Other: No results found for requested labs within last 8760 hours. Note: Above Lab results reviewed.  Imaging  Last 90 days:  Dg Pain Clinic C-arm 1-60 Min No Report  Result Date: 03/27/2019 Fluoro was used, but no Radiologist interpretation will be provided. Please refer to "NOTES"  tab for provider progress note.  Assessment  The primary encounter diagnosis was Chronic pain syndrome. Diagnoses of Chronic low back pain (Primary Area of Pain) (Bilateral) (L>R), Lumbar facet syndrome (Bilateral) (L>R), Chronic sacroiliac joint pain (Left), Chronic hip pain (Bilateral), Osteoarthritis of hip (Bilateral), Insomnia, unspecified type, Chronic shoulder pain after shoulder replacement (Bilateral) (L>R), Opioid-induced constipation (OIC), Restless leg syndrome, Chronic anticoagulation (Eliquis), and Osteoarthritis involving multiple joints were also pertinent to this visit.  Plan of Care  I have discontinued [place]. I have also changed her Melatonin. Additionally, I am having her start on Benefiber and NONFORMULARY OR COMPOUNDED ITEM. Lastly, I am having her maintain her apixaban, OXYGEN, Vitamin D, albuterol, acetaminophen, bisacodyl, polyethylene glycol, rOPINIRole, Oxycodone HCl, Oxycodone HCl, furosemide, lisinopril, levocetirizine, and Oxycodone HCl.  Pharmacotherapy (Medications Ordered): Meds ordered this encounter  Medications  . Melatonin 10 MG CAPS    Sig: Take 10 mg by mouth at bedtime as needed.    Dispense:  90 capsule    Refill:  3    Fill one day early if pharmacy is closed on scheduled refill date. May substitute for generic or OTC, if available.  . Wheat Dextrin (BENEFIBER) POWD    Sig: Take 6 g by mouth 3 (three) times daily before meals. (2 tsp = 6 g)    Dispense:  730 g    Refill:  8    Fill one day early if pharmacy is closed on scheduled refill date. May substitute for generic if available.  . Oxycodone HCl 20 MG TABS    Sig: Take 1 tablet (20 mg total) by mouth every 8 (eight) hours as needed. Must last 30 days    Dispense:  90 tablet    Refill:  0    Chronic Pain: STOP Act (Not applicable) Fill 1 day early if closed on refill date. Do not fill until: 08/01/2019. To last until: 08/31/2019. Avoid benzodiazepines within 8 hours of opioids   . NONFORMULARY OR COMPOUNDED ITEM    Sig: Sig: Apply 1-2 gm(s) (2-4 pumps) to affected area, 3-4 times/day. (1 pump = 0.5 gm)    Dispense:  1 each    Refill:  2    Compounded cream: 2.5% Lidocaine, 10% Ketamine, 10% Ketoprofen, 6% Gabapentin  Dispense: 120 gm Pump Bottle. (Dispenser: 1 pump = 0.5 gm.)   Orders:  Orders Placed This Encounter  Procedures  . LUMBAR FACET(MEDIAL BRANCH NERVE BLOCK) MBNB    Standing Status:   Future  Standing Expiration Date:   07/05/2019    Scheduling Instructions:     Side: Left-sided     Level: L3-4, L4-5, & L5-S1 Facets (L2, L3, L4, L5, & S1 Medial Branch Nerves)     Sedation: With Sedation.     Timeframe: ASAA    Order Specific Question:   Where will this procedure be performed?    Answer:   [facility]  . SACROILIAC JOINT INJECTION    Standing Status:   Future    Standing Expiration Date:   07/05/2019    Scheduling Instructions:     Side: Left-sided     Sedation: With Sedation.     Timeframe: ASAP    Order Specific Question:   Where will this procedure be performed?    Answer:   [facility]  . HIP INJECTION    Standing Status:   Future    Standing Expiration Date:   07/04/2019    Scheduling Instructions:     Side: Left-sided     Sedation: With Sedation.     Timeframe: As soon as schedule allows  . Blood Thinner Instructions to Nursing    If the patient requires a Lovenox-bridge therapy, make sure arrangements are made to institute it with the assistance of the PCP.    Standing Status:   Future    Standing Expiration Date:   07/05/2019    Scheduling Instructions:     Always stop the Eliquis (Apixaban) x 3 days prior to procedure or surgery.   Follow-up plan:   Return in about 3 months (around 08/29/2019) for Procedure (w/ sedation): (L) L-FCT BLK + (L) SI BLK + (L) Hip BLK, (Blood-thinner Protocol), (ASAP).     Interventional management options:  Considering:   NOTE: ELIQUIS ANTICOAGULATION (Stop: x3 days prior to  procedure  Restart 6 hours after procedures) Diagnostic bilateral SI joint block Possible bilateral SI joint RFA Diagnostic bilateral femoral nerve + obturator NB Possible bilateral femoral nerve + obturator nerve RFA Diagnostic right IA shoulder joint injection   Palliative PRN treatment(s):   Palliative left CESI #3  Palliative left trochanteric bursa injection #2  Palliative bilateral IA hip jint inj. #2  (0% on the left but 100% on the right) Palliative bilateral lumbar facet block Palliative left lumbar facet RFA #4 (Last done on 08/01/18) Palliative left lumbar facet RFA #2 (Last done on 09/19/18) Palliative bilateral suprascapular nerve block Palliative bilateral suprascapular nerve RFA #2(Last done 07/13/18)    Recent Visits Date Type Provider Dept  04/12/19 Office Visit Milinda Pointer, MD [facility]  03/27/19 Procedure visit Milinda Pointer, MD [facility]  03/12/19 Office Visit Milinda Pointer, MD [facility]  Showing recent visits within past 90 days and meeting all other requirements   Today's Visits Date Type Provider Dept  06/04/19 Office Visit Milinda Pointer, MD [facility]  Showing today's visits and meeting all other requirements   Future Appointments No visits were found meeting these conditions.  Showing future appointments within next 90 days and meeting all other requirements   I discussed the assessment and treatment plan with the patient. The patient was provided an opportunity to ask questions and all were answered. The patient agreed with the plan and demonstrated an understanding of the instructions.  Patient advised to call back or seek an in-person evaluation if the symptoms or condition worsens.  Total duration of non-face-to-face encounter: 15 minutes.  Note by: Gaspar Cola, MD Date: 06/04/2019; Time: 12:38  PM  Note: This dictation was prepared with Dragon dictation. Any  transcriptional errors that may result from this process are unintentional.  Disclaimer:  * Given the special circumstances of the COVID-19 pandemic, the federal government has announced that the Office for Civil Rights (OCR) will exercise its enforcement discretion and will not impose penalties on physicians using telehealth in the event of noncompliance with regulatory requirements under the [place] (HIPAA) in connection with the good faith provision of telehealth during the EZBMZ-58 national public health emergency. ([place])

## 2019-06-04 ENCOUNTER — Other Ambulatory Visit: Payer: Self-pay

## 2019-06-04 ENCOUNTER — Other Ambulatory Visit: Payer: Self-pay | Admitting: Nurse Practitioner

## 2019-06-04 ENCOUNTER — Ambulatory Visit: Payer: Medicare Other | Attending: Pain Medicine | Admitting: Pain Medicine

## 2019-06-04 DIAGNOSIS — G2581 Restless legs syndrome: Secondary | ICD-10-CM

## 2019-06-04 DIAGNOSIS — G4700 Insomnia, unspecified: Secondary | ICD-10-CM

## 2019-06-04 DIAGNOSIS — M25519 Pain in unspecified shoulder: Secondary | ICD-10-CM

## 2019-06-04 DIAGNOSIS — K5903 Drug induced constipation: Secondary | ICD-10-CM

## 2019-06-04 DIAGNOSIS — M160 Bilateral primary osteoarthritis of hip: Secondary | ICD-10-CM

## 2019-06-04 DIAGNOSIS — M159 Polyosteoarthritis, unspecified: Secondary | ICD-10-CM

## 2019-06-04 DIAGNOSIS — M8949 Other hypertrophic osteoarthropathy, multiple sites: Secondary | ICD-10-CM

## 2019-06-04 DIAGNOSIS — G894 Chronic pain syndrome: Secondary | ICD-10-CM

## 2019-06-04 DIAGNOSIS — M5441 Lumbago with sciatica, right side: Secondary | ICD-10-CM

## 2019-06-04 DIAGNOSIS — G8929 Other chronic pain: Secondary | ICD-10-CM

## 2019-06-04 DIAGNOSIS — Z96619 Presence of unspecified artificial shoulder joint: Secondary | ICD-10-CM

## 2019-06-04 DIAGNOSIS — M47816 Spondylosis without myelopathy or radiculopathy, lumbar region: Secondary | ICD-10-CM

## 2019-06-04 DIAGNOSIS — Z7901 Long term (current) use of anticoagulants: Secondary | ICD-10-CM

## 2019-06-04 DIAGNOSIS — M533 Sacrococcygeal disorders, not elsewhere classified: Secondary | ICD-10-CM

## 2019-06-04 DIAGNOSIS — T402X5A Adverse effect of other opioids, initial encounter: Secondary | ICD-10-CM

## 2019-06-04 MED ORDER — MELATONIN 10 MG PO CAPS: 10.0000 mg | ORAL_CAPSULE | Freq: Every evening | ORAL | 3 refills | Status: DC | PRN

## 2019-06-04 MED ORDER — BENEFIBER PO POWD: 6.0000 g | Freq: Three times a day (TID) | ORAL | 8 refills | Status: DC

## 2019-06-04 MED ORDER — OXYCODONE HCL 20 MG PO TABS: 20.0000 mg | ORAL_TABLET | Freq: Three times a day (TID) | ORAL | 0 refills | Status: DC | PRN

## 2019-06-04 MED ORDER — NONFORMULARY OR COMPOUNDED ITEM: 2 refills | Status: DC

## 2019-06-04 NOTE — Patient Instructions (Signed)

## 2019-06-05 ENCOUNTER — Telehealth: Payer: Self-pay | Admitting: Pain Medicine

## 2019-06-05 NOTE — Telephone Encounter (Signed)
Patient says she is returning a nurse call about compound cream. Her pharmacy does not have it. Please call patient back

## 2019-06-06 ENCOUNTER — Other Ambulatory Visit: Payer: Self-pay | Admitting: Pharmacy Technician

## 2019-06-06 NOTE — Patient Outreach (Signed)
[facility]) Care Management  06/06/2019  Kristin [name] 09-11-1929 [id number]   Care coordination call placed to [employer] in regards to patient's Eliquis application.  Spoke to [place] who provided me the patient's case ID number as [id number]. She also informed the patient was APPROVED 05/31/2019-08/23/2019. She informed it an take 2-3 business days to process into the pharmacy's system. She informed the patient should received the medication in another 5-7 business days.  Will followup with patient in 7-10 business days to confirm receipt of medication.  Earline Stiner P. Wanisha Shiroma, [place] Management [phone]

## 2019-06-06 NOTE — Telephone Encounter (Signed)
Called patient back. She is k with using the [facility] in [place] for her compounded cream. I will fax the script over today. Patient informed to call us back with any problems obtaining her medications.

## 2019-06-12 ENCOUNTER — Encounter: Payer: Self-pay | Admitting: Pain Medicine

## 2019-06-12 ENCOUNTER — Ambulatory Visit
Admission: RE | Admit: 2019-06-12 | Discharge: 2019-06-12 | Disposition: A | Discharge status: Home / Self Care | Payer: Medicare Other | Source: Ambulatory Visit | Attending: Pain Medicine | Admitting: Pain Medicine

## 2019-06-12 ENCOUNTER — Other Ambulatory Visit: Payer: Self-pay

## 2019-06-12 ENCOUNTER — Ambulatory Visit (HOSPITAL_BASED_OUTPATIENT_CLINIC_OR_DEPARTMENT_OTHER): Payer: Medicare Other | Admitting: Pain Medicine

## 2019-06-12 VITALS — BP 166/99 | HR 63 | Temp 97.4°F | Resp 16 | Ht 68.0 in | Wt 162.0 lb

## 2019-06-12 DIAGNOSIS — M5388 Other specified dorsopathies, sacral and sacrococcygeal region: Secondary | ICD-10-CM | POA: Diagnosis present

## 2019-06-12 DIAGNOSIS — M1612 Unilateral primary osteoarthritis, left hip: Secondary | ICD-10-CM

## 2019-06-12 DIAGNOSIS — M47816 Spondylosis without myelopathy or radiculopathy, lumbar region: Secondary | ICD-10-CM | POA: Insufficient documentation

## 2019-06-12 DIAGNOSIS — M533 Sacrococcygeal disorders, not elsewhere classified: Secondary | ICD-10-CM | POA: Diagnosis present

## 2019-06-12 DIAGNOSIS — G8929 Other chronic pain: Secondary | ICD-10-CM | POA: Insufficient documentation

## 2019-06-12 DIAGNOSIS — M51379 Other intervertebral disc degeneration, lumbosacral region without mention of lumbar back pain or lower extremity pain: Secondary | ICD-10-CM

## 2019-06-12 DIAGNOSIS — M25552 Pain in left hip: Secondary | ICD-10-CM | POA: Diagnosis present

## 2019-06-12 DIAGNOSIS — M47817 Spondylosis without myelopathy or radiculopathy, lumbosacral region: Secondary | ICD-10-CM | POA: Diagnosis present

## 2019-06-12 DIAGNOSIS — M5442 Lumbago with sciatica, left side: Secondary | ICD-10-CM

## 2019-06-12 DIAGNOSIS — M5137 Other intervertebral disc degeneration, lumbosacral region: Secondary | ICD-10-CM

## 2019-06-12 DIAGNOSIS — Z7901 Long term (current) use of anticoagulants: Secondary | ICD-10-CM | POA: Insufficient documentation

## 2019-06-12 DIAGNOSIS — M5441 Lumbago with sciatica, right side: Secondary | ICD-10-CM

## 2019-06-12 MED ORDER — METHYLPREDNISOLONE ACETATE 40 MG/ML IJ SUSP
40.0000 mg | Freq: Once | INTRAMUSCULAR | Status: AC
  Administered 2019-06-12: 40 mg via INTRA_ARTICULAR

## 2019-06-12 MED ORDER — ROPIVACAINE HCL 2 MG/ML IJ SOLN
4.0000 mL | Freq: Once | INTRAMUSCULAR | Status: AC
  Administered 2019-06-12: 4 mL via INTRA_ARTICULAR

## 2019-06-12 MED ORDER — LIDOCAINE HCL 2 % IJ SOLN
INTRAMUSCULAR | Status: AC
  Filled 2019-06-12: qty 20

## 2019-06-12 MED ORDER — FENTANYL CITRATE (PF) 100 MCG/2ML IJ SOLN
INTRAMUSCULAR | Status: AC
  Filled 2019-06-12: qty 2

## 2019-06-12 MED ORDER — ROPIVACAINE HCL 2 MG/ML IJ SOLN
INTRAMUSCULAR | Status: AC
  Filled 2019-06-12: qty 20

## 2019-06-12 MED ORDER — LACTATED RINGERS IV SOLN
1000.0000 mL | Freq: Once | INTRAVENOUS | Status: AC
  Administered 2019-06-12: 1000 mL via INTRAVENOUS

## 2019-06-12 MED ORDER — METHYLPREDNISOLONE ACETATE 40 MG/ML IJ SUSP
INTRAMUSCULAR | Status: AC
  Filled 2019-06-12: qty 2

## 2019-06-12 MED ORDER — IOHEXOL 180 MG/ML  SOLN
INTRAMUSCULAR | Status: AC
  Filled 2019-06-12: qty 20

## 2019-06-12 MED ORDER — LIDOCAINE HCL 2 % IJ SOLN: 20.0000 mL | Freq: Once | INTRAMUSCULAR | Status: DC

## 2019-06-12 MED ORDER — FENTANYL CITRATE (PF) 100 MCG/2ML IJ SOLN
25.0000 ug | INTRAMUSCULAR | Status: DC | PRN
  Administered 2019-06-12: 50 ug via INTRAVENOUS

## 2019-06-12 MED ORDER — IOHEXOL 180 MG/ML  SOLN
10.0000 mL | Freq: Once | INTRAMUSCULAR | Status: AC
  Administered 2019-06-12: 10 mL via INTRA_ARTICULAR

## 2019-06-12 MED ORDER — MIDAZOLAM HCL 5 MG/5ML IJ SOLN
1.0000 mg | INTRAMUSCULAR | Status: DC | PRN
  Administered 2019-06-12: 11:00:00 2 mg via INTRAVENOUS

## 2019-06-12 MED ORDER — TRIAMCINOLONE ACETONIDE 40 MG/ML IJ SUSP
INTRAMUSCULAR | Status: AC
  Filled 2019-06-12: qty 1

## 2019-06-12 MED ORDER — TRIAMCINOLONE ACETONIDE 40 MG/ML IJ SUSP
40.0000 mg | Freq: Once | INTRAMUSCULAR | Status: AC
  Administered 2019-06-12: 40 mg

## 2019-06-12 MED ORDER — MIDAZOLAM HCL 5 MG/5ML IJ SOLN
INTRAMUSCULAR | Status: AC
  Filled 2019-06-12: qty 5

## 2019-06-12 MED ORDER — ROPIVACAINE HCL 2 MG/ML IJ SOLN
9.0000 mL | Freq: Once | INTRAMUSCULAR | Status: AC
  Administered 2019-06-12: 9 mL via PERINEURAL

## 2019-06-12 NOTE — Progress Notes (Signed)
Safety precautions to be maintained throughout the outpatient stay will include: orient to surroundings, keep bed in low position, maintain call bell within reach at all times, provide assistance with transfer out of bed and ambulation.  

## 2019-06-12 NOTE — Progress Notes (Signed)
Patient's Name: Kristin [name]  MRN: [id number]  Referring Provider: Mikey College, *  DOB: June 24, 1930  PCP: Mikey College, NP  DOS: 06/12/2019  Note by: Gaspar Cola, MD  Service setting: Ambulatory outpatient  Specialty: Interventional Pain Management  Patient type: Established  Location: [facility] (AMB) Pain Management Facility  Visit type: Interventional Procedure   Primary Reason for Visit: Interventional Pain Management Treatment. CC: Back Pain (lower) and Hip Pain (left)  Procedure #1:  Anesthesia, Analgesia, Anxiolysis:  Procedure #1: Type: Medial Branch Facet Block #4 Primary Purpose: Diagnostic Region: Lumbar Level: L2, L3, L4, L5, & S1 Medial Branch Level(s) Target Area: For Lumbar Facet blocks, the target is the groove formed by the junction of the transverse process and superior articular process. For the L5 dorsal ramus, the target is the notch between superior articular process and sacral ala. For the S1 dorsal ramus, the target is the superior and lateral edge of the posterior S1 Sacral foramen. Approach: Posterior, paramedial, percutaneous approach. Laterality: Left  Procedure #2: Type: Sacroiliac Joint Block  #1  Primary Purpose: Diagnostic Region: Posterior Lumbosacral Level: PSIS (Posterior Superior Iliac Spine) Sacroiliac Joint Target Area: For upper sacroiliac joint block(s), the target is the superior and posterior margin of the sacroiliac joint. Approach: Ipsilateral approach. Laterality: Left  Type: Moderate (Conscious) Sedation combined with Local Anesthesia Indication(s): Analgesia and Anxiety Route: Intravenous (IV) IV Access: Secured Sedation: Meaningful verbal contact was maintained at all times during the procedure  Local Anesthetic: Lidocaine 1-2%  Position: Prone   Indications: 1. Lumbar facet syndrome (Bilateral) (L>R)   2. Spondylosis without myelopathy or radiculopathy, lumbosacral region   3. Lumbar facet hypertrophy (L1-2,  L2-3, and L4-5)   4. DDD (degenerative disc disease), lumbosacral   5. Chronic sacroiliac joint pain (Left)   6. Other specified dorsopathies, sacral and sacrococcygeal region   7. Chronic low back pain (Primary Area of Pain) (Bilateral) (L>R)    Procedure #3:  Anesthesia, Analgesia, Anxiolysis:  Type: Intra-Articular Hip Injection #2  Primary Purpose: Diagnostic Region: Posterolateral hip joint area. Level: Lower pelvic and hip joint level. Target Area: Superior aspect of the hip joint cavity, going thru the superior portion of the capsular ligament. Approach: Posterolateral approach. Laterality: Left-Sided  Type: Moderate (Conscious) Sedation combined with Local Anesthesia Indication(s): Analgesia and Anxiety Route: Intravenous (IV) IV Access: Secured Sedation: Meaningful verbal contact was maintained at all times during the procedure  Local Anesthetic: Lidocaine 1-2%  Position: Lateral Decubitus with bad side up Prepped Area: Entire Posterolateral hip area. Prepping solution: DuraPrep (Iodine Povacrylex [0.7% available iodine] and Isopropyl Alcohol, 74% w/w)   Indications: 1. Chronic hip pain (Left)   2. Osteoarthritis of hip (Left)    Pain Score: Pre-procedure: 6 /10 Post-procedure: 0-No pain/10   Pre-op Assessment:  Kristin [name] is a 85 y.o. (year old), female patient, seen today for interventional treatment. She  has a past surgical history that includes Abdominal hysterectomy; Back surgery; Rotator cuff repair; Breast surgery; Cataract extraction w/PHACO (10/19/2011); Cataract extraction w/PHACO (11/02/2011); Carpal tunnel release; Joint replacement; Colon surgery; Pacemaker insertion (N/A, 12/24/2015); Cholecystectomy; Breast lumpectomy (Right, 2004); and Insert / replace / remove pacemaker. Kristin [name] has a current medication list which includes the following prescription(s): acetaminophen, albuterol, apixaban, bisacodyl, vitamin d, furosemide, levocetirizine, lisinopril,  melatonin, NONFORMULARY OR COMPOUNDED ITEM, oxycodone hcl, oxycodone hcl, oxycodone hcl, oxygen-helium, polyethylene glycol, ropinirole, and benefiber, and the following Facility-Administered Medications: fentanyl, lidocaine, and midazolam. Her primarily concern today is the Back Pain (lower)  and Hip Pain (left)  Initial Vital Signs:  Pulse/HCG Rate: 78ECG Heart Rate: 61 Temp: (!) 97 F (36.1 C) Resp: 16 BP: (!) 180/95 SpO2: 95 %  BMI: Estimated body mass index is 24.63 kg/m as calculated from the following:   Height as of this encounter: 5\' 8"  (1.727 m).   Weight as of this encounter: 162 lb (73.5 kg).  Risk Assessment: Allergies: Reviewed. She is allergic to flexeril [cyclobenzaprine hcl] and vancomycin.  Allergy Precautions: None required Coagulopathies: Reviewed. None identified.  Blood-thinner therapy: None at this time Active Infection(s): Reviewed. None identified. Kristin [name] is afebrile  Site Confirmation: Kristin [name] was asked to confirm the procedure and laterality before marking the site Procedure checklist: Completed Consent: Before the procedure and under the influence of no sedative(s), amnesic(s), or anxiolytics, the patient was informed of the treatment options, risks and possible complications. To fulfill our ethical and legal obligations, as recommended by the American Medical Association's Code of Ethics, I have informed the patient of my clinical impression; the nature and purpose of the treatment or procedure; the risks, benefits, and possible complications of the intervention; the alternatives, including doing nothing; the risk(s) and benefit(s) of the alternative treatment(s) or procedure(s); and the risk(s) and benefit(s) of doing nothing. The patient was provided information about the general risks and possible complications associated with the procedure. These may include, but are not limited to: failure to achieve desired goals, infection, bleeding, organ or nerve  damage, allergic reactions, paralysis, and death. In addition, the patient was informed of those risks and complications associated to Spine-related procedures, such as failure to decrease pain; infection (i.e.: Meningitis, epidural or intraspinal abscess); bleeding (i.e.: epidural hematoma, subarachnoid hemorrhage, or any other type of intraspinal or peri-dural bleeding); organ or nerve damage (i.e.: Any type of peripheral nerve, nerve root, or spinal cord injury) with subsequent damage to sensory, motor, and/or autonomic systems, resulting in permanent pain, numbness, and/or weakness of one or several areas of the body; allergic reactions; (i.e.: anaphylactic reaction); and/or death. Furthermore, the patient was informed of those risks and complications associated with the medications. These include, but are not limited to: allergic reactions (i.e.: anaphylactic or anaphylactoid reaction(s)); adrenal axis suppression; blood sugar elevation that in diabetics may result in ketoacidosis or comma; water retention that in patients with history of congestive heart failure may result in shortness of breath, pulmonary edema, and decompensation with resultant heart failure; weight gain; swelling or edema; medication-induced neural toxicity; particulate matter embolism and blood vessel occlusion with resultant organ, and/or nervous system infarction; and/or aseptic necrosis of one or more joints. Finally, the patient was informed that Medicine is not an exact science; therefore, there is also the possibility of unforeseen or unpredictable risks and/or possible complications that may result in a catastrophic outcome. The patient indicated having understood very clearly. We have given the patient no guarantees and we have made no promises. Enough time was given to the patient to ask questions, all of which were answered to the patient's satisfaction. Kristin [name] has indicated that she wanted to continue with the  procedure. Attestation: I, the ordering provider, attest that I have discussed with the patient the benefits, risks, side-effects, alternatives, likelihood of achieving goals, and potential problems during recovery for the procedure that I have provided informed consent. Date  Time: 06/12/2019  9:57 AM  Pre-Procedure Preparation:  Monitoring: As per clinic protocol. Respiration, ETCO2, SpO2, BP, heart rate and rhythm monitor placed and checked for adequate function Safety Precautions:  Patient was assessed for positional comfort and pressure points before starting the procedure. Time-out: I initiated and conducted the "Time-out" before starting the procedure, as per protocol. The patient was asked to participate by confirming the accuracy of the "Time Out" information. Verification of the correct person, site, and procedure were performed and confirmed by me, the nursing staff, and the patient. "Time-out" conducted as per Joint Commission's Universal Protocol (UP.01.01.01). Time: 1034  Description of Procedure #1:   Time-out: "Time-out" completed before starting procedure, as per protocol. Area Prepped: Entire Posterior Lumbosacral Region Prepping solution: DuraPrep (Iodine Povacrylex [0.7% available iodine] and Isopropyl Alcohol, 74% w/w) Safety Precautions: Aspiration looking for blood return was conducted prior to all injections. At no point did we inject any substances, as a needle was being advanced. No attempts were made at seeking any paresthesias. Safe injection practices and needle disposal techniques used. Medications properly checked for expiration dates. SDV (single dose vial) medications used.  Description of the Procedure: Protocol guidelines were followed. The patient was placed in position over the fluoroscopy table. The target area was identified and the area prepped in the usual manner. Skin & deeper tissues infiltrated with local anesthetic. Appropriate amount of time allowed to  pass for local anesthetics to take effect. The procedure needle was introduced through the skin, ipsilateral to the reported pain, and advanced to the target area. Employing the "Medial Branch Technique", the needles were advanced to the angle made by the superior and medial portion of the transverse process, and the lateral and inferior portion of the superior articulating process of the targeted vertebral bodies. This area is known as "Burton's Eye" or the "Eye of the [place] Dog". A procedure needle was introduced through the skin, and this time advanced to the angle made by the superior and medial border of the sacral ala, and the lateral border of the S1 vertebral body. This last needle was later repositioned at the superior and lateral border of the posterior S1 foramen. Negative aspiration confirmed. Solution injected in intermittent fashion, asking for systemic symptoms every 0.5cc of injectate. The needles were then removed and the area cleansed, making sure to leave some of the prepping solution back to take advantage of its long term bactericidal properties.  Start Time: 1034 hrs.  Materials:  Needle(s) Type: Spinal Needle Gauge: 22G Length: 3.5-in Medication(s): Please see orders for medications and dosing details.  Description of Procedure # 2:   Area Prepped: Entire Posterior Lumbosacral Region Prepping solution: DuraPrep (Iodine Povacrylex [0.7% available iodine] and Isopropyl Alcohol, 74% w/w) Safety Precautions: Aspiration looking for blood return was conducted prior to all injections. At no point did we inject any substances, as a needle was being advanced. No attempts were made at seeking any paresthesias. Safe injection practices and needle disposal techniques used. Medications properly checked for expiration dates. SDV (single dose vial) medications used. Description of the Procedure: Protocol guidelines were followed. The patient was placed in position over the fluoroscopy table.  The target area was identified and the area prepped in the usual manner. Skin & deeper tissues infiltrated with local anesthetic. Appropriate amount of time allowed to pass for local anesthetics to take effect. The procedure needle was advanced under fluoroscopic guidance into the sacroiliac joint until a firm endpoint was obtained. Proper needle placement secured. Negative aspiration confirmed. Solution injected in intermittent fashion, asking for systemic symptoms every 0.5cc of injectate. The needles were then removed and the area cleansed, making sure to leave some of the prepping  solution back to take advantage of its long term bactericidal properties.  Materials:  Needle(s) Type: Spinal Needle Gauge: 22G Length: 3.5-in Medication(s): Please see orders for medications and dosing details.  Imaging Guidance (Spinal) for procedure #1 & 2:  Type of Imaging Technique: Fluoroscopy Guidance (Spinal) Indication(s): Assistance in needle guidance and placement for procedures requiring needle placement in or near specific anatomical locations not easily accessible without such assistance. Exposure Time: Please see nurses notes. Contrast: None used. Fluoroscopic Guidance: I was personally present during the use of fluoroscopy. "Tunnel Vision Technique" used to obtain the best possible view of the target area. Parallax error corrected before commencing the procedure. "Direction-depth-direction" technique used to introduce the needle under continuous pulsed fluoroscopy. Once target was reached, antero-posterior, oblique, and lateral fluoroscopic projection used confirm needle placement in all planes. Images permanently stored in EMR. Interpretation: No contrast injected. I personally interpreted the imaging intraoperatively. Adequate needle placement confirmed in multiple planes. Permanent images saved into the patient's record.  Description of Procedure #3:  Safety Precautions: Aspiration looking for blood  return was conducted prior to all injections. At no point did we inject any substances, as a needle was being advanced. No attempts were made at seeking any paresthesias. Safe injection practices and needle disposal techniques used. Medications properly checked for expiration dates. SDV (single dose vial) medications used. Description of the Procedure: Protocol guidelines were followed. The patient was placed in position over the fluoroscopy table. The target area was identified and the area prepped in the usual manner. Skin & deeper tissues infiltrated with local anesthetic. Appropriate amount of time allowed to pass for local anesthetics to take effect. The procedure needles were then advanced to the target area. Proper needle placement secured. Negative aspiration confirmed. Solution injected in intermittent fashion, asking for systemic symptoms every 0.5cc of injectate. The needles were then removed and the area cleansed, making sure to leave some of the prepping solution back to take advantage of its long term bactericidal properties.  Vitals:   06/12/19 1104 06/12/19 1113 06/12/19 1114 06/12/19 1125  BP: (!) 157/104 (!) 163/106 (!) 164/96 (!) 166/99  Pulse:      Resp: 14 14 14 16   Temp: (!) 97.4 F (36.3 C)     TempSrc:      SpO2: 96% 96% 96% 97%  Weight:      Height:         End Time: 1057 hrs.  Materials:  Needle(s) Type: Spinal Needle Gauge: 22G Length: 5.0-in Medication(s): Please see orders for medications and dosing details.  Imaging Guidance (Non-Spinal) for procedure #3:  Type of Imaging Technique: Fluoroscopy Guidance (Non-Spinal) Indication(s): Assistance in needle guidance and placement for procedures requiring needle placement in or near specific anatomical locations not easily accessible without such assistance. Exposure Time: Please see nurses notes. Contrast: Before injecting any contrast, we confirmed that the patient did not have an allergy to iodine, shellfish, or  radiological contrast. Once satisfactory needle placement was completed at the desired level, radiological contrast was injected. Contrast injected under live fluoroscopy. No contrast complications. See chart for type and volume of contrast used. Fluoroscopic Guidance: I was personally present during the use of fluoroscopy. "Tunnel Vision Technique" used to obtain the best possible view of the target area. Parallax error corrected before commencing the procedure. "Direction-depth-direction" technique used to introduce the needle under continuous pulsed fluoroscopy. Once target was reached, antero-posterior, oblique, and lateral fluoroscopic projection used confirm needle placement in all planes. Images permanently stored in  EMR. Interpretation: I personally interpreted the imaging intraoperatively. Adequate needle placement confirmed in multiple planes. Appropriate spread of contrast into desired area was observed. No evidence of afferent or efferent intravascular uptake. Permanent images saved into the patient's record.  Antibiotic Prophylaxis:   Anti-infectives (From admission, onward)   None     Indication(s): None identified  Post-operative Assessment:  Post-procedure Vital Signs:  Pulse/HCG Rate: 6360 Temp: (!) 97.4 F (36.3 C) Resp: 16 BP: (!) 166/99 SpO2: 97 %  EBL: None  Complications: No immediate post-treatment complications observed by team, or reported by patient.  Note: The patient tolerated the entire procedure well. A repeat set of vitals were taken after the procedure and the patient was kept under observation following institutional policy, for this type of procedure. Post-procedural neurological assessment was performed, showing return to baseline, prior to discharge. The patient was provided with post-procedure discharge instructions, including a section on how to identify potential problems. Should any problems arise concerning this procedure, the patient was given  instructions to immediately contact us, at any time, without hesitation. In any case, we plan to contact the patient by telephone for a follow-up status report regarding this interventional procedure.  Comments:  No additional relevant information.  Plan of Care  Orders:  Orders Placed This Encounter  Procedures  . LUMBAR FACET(MEDIAL BRANCH NERVE BLOCK) MBNB    Scheduling Instructions:     Side: Left-sided     Level: L3-4, L4-5, & L5-S1 Facets (L2, L3, L4, L5, & S1 Medial Branch Nerves)     Sedation: With Sedation.     Timeframe: Today    Order Specific Question:   Where will this procedure be performed?    Answer:   [facility]  . SACROILIAC JOINT INJECTION    Scheduling Instructions:     Side: Left-sided     Sedation: With Sedation.     Timeframe: Today    Order Specific Question:   Where will this procedure be performed?    Answer:   [facility]  . HIP INJECTION    Scheduling Instructions:     Side: Left-sided     Sedation: With Sedation.     Timeframe: Today  . DG PAIN CLINIC C-ARM 1-60 MIN NO REPORT    Intraoperative interpretation by procedural physician at [place].    Standing Status:   Standing    Number of Occurrences:   1    Order Specific Question:   Reason for exam:    Answer:   Assistance in needle guidance and placement for procedures requiring needle placement in or near specific anatomical locations not easily accessible without such assistance.  . Informed Consent Details: Physician/Practitioner Attestation; Transcribe to consent form and obtain patient signature    Nursing Order: Transcribe to consent form and obtain patient signature. Note: Always confirm laterality of pain with Kristin [name], before procedure. Procedure: Lumbar Facet Block  under fluoroscopic guidance Indication/Reason: Low Back Pain, with our without leg pain, due to Facet Joint Arthralgia (Joint Pain) known as Lumbar Facet Syndrome, secondary to Lumbar, and/or  Lumbosacral Spondylosis (Arthritis of the Spine), without myelopathy or radiculopathy (Nerve Damage). Provider Attestation: I, [place] Dossie Arbour, MD, (Pain Management Specialist), the physician/practitioner, attest that I have discussed with the patient the benefits, risks, side effects, alternatives, likelihood of achieving goals and potential problems during recovery for the procedure that I have provided informed consent.  . Informed Consent Details: Physician/Practitioner Attestation; Transcribe to consent form and obtain patient  signature    Provider Attestation: I, [place] Dossie Arbour, MD, (Pain Management Specialist), the physician/practitioner, attest that I have discussed with the patient the benefits, risks, side effects, alternatives, likelihood of achieving goals and potential problems during recovery for the procedure that I have provided informed consent.    Scheduling Instructions:     Procedure: Sacroiliac Joint Block     Indication/Reason: Chronic Low Back and Hip Pain secondary to Sacroiliac Joint Pain (Arthralgia/Arthropathy)     Nursing Order: Transcribe to consent form and obtain patient signature.     Note: Always confirm laterality of pain with Kristin [name], before procedure.  . Care order/instruction: Please confirm that the patient has stopped the Eliquis (Apixaban) x 3 days prior to procedure or surgery.    Please confirm that the patient has stopped the Eliquis (Apixaban) x 3 days prior to procedure or surgery.    Standing Status:   Standing    Number of Occurrences:   1  . Provide equipment / supplies at bedside    Equipment required: Single use, disposable, "Block Tray"    Standing Status:   Standing    Number of Occurrences:   1    Order Specific Question:   Specify    Answer:   Block Tray  . Informed Consent Details: Physician/Practitioner Attestation; Transcribe to consent form and obtain patient signature    Nursing Order: Transcribe to consent form and obtain  patient signature. Note: Always confirm laterality of pain with Kristin [name], before procedure. Procedure: Hip injection Indication/Reason: Hip Joint Pain (Arthralgia) Provider Attestation: I, [place] Dossie Arbour, MD, (Pain Management Specialist), the physician/practitioner, attest that I have discussed with the patient the benefits, risks, side effects, alternatives, likelihood of achieving goals and potential problems during recovery for the procedure that I have provided informed consent.  . Bleeding precautions    Standing Status:   Standing    Number of Occurrences:   1   Chronic Opioid Analgesic:  Oxycodone IR 20 mg, 1 tab PO q 8 hrs (60 mg/day of oxycodone) MME/day: 90 mg/day.   Medications ordered for procedure: Meds ordered this encounter  Medications  . lidocaine (XYLOCAINE) 2 % (with pres) injection 400 mg  . lactated ringers infusion 1,000 mL  . midazolam (VERSED) 5 MG/5ML injection 1-2 mg    Make sure Flumazenil is available in the pyxis when using this medication. If oversedation occurs, administer 0.2 mg IV over 15 sec. If after 45 sec no response, administer 0.2 mg again over 1 min; may repeat at 1 min intervals; not to exceed 4 doses (1 mg)  . fentaNYL (SUBLIMAZE) injection 25-50 mcg    Make sure Narcan is available in the pyxis when using this medication. In the event of respiratory depression (RR< 8/min): Titrate NARCAN (naloxone) in increments of 0.1 to 0.2 mg IV at 2-3 minute intervals, until desired degree of reversal.  . ropivacaine (PF) 2 mg/mL (0.2%) (NAROPIN) injection 9 mL  . triamcinolone acetonide (KENALOG-40) injection 40 mg  . ropivacaine (PF) 2 mg/mL (0.2%) (NAROPIN) injection 4 mL  . methylPREDNISolone acetate (DEPO-MEDROL) injection 40 mg  . iohexol (OMNIPAQUE) 180 MG/ML injection 10 mL    Must be Myelogram-compatible. If not available, you may substitute with a water-soluble, non-ionic, hypoallergenic, myelogram-compatible radiological contrast medium.  .  ropivacaine (PF) 2 mg/mL (0.2%) (NAROPIN) injection 4 mL  . methylPREDNISolone acetate (DEPO-MEDROL) injection 40 mg   Medications administered: We administered lactated ringers, midazolam, fentaNYL, ropivacaine (PF) 2 mg/mL (0.2%), triamcinolone acetonide,  ropivacaine (PF) 2 mg/mL (0.2%), methylPREDNISolone acetate, iohexol, ropivacaine (PF) 2 mg/mL (0.2%), and methylPREDNISolone acetate.  See the medical record for exact dosing, route, and time of administration.  Follow-up plan:   Return in about 2 weeks (around 06/26/2019) for (VV), (PP).       Interventional management options:  Considering:   NOTE: ELIQUIS ANTICOAGULATION (Stop: x3 days prior to procedure  Restart 6 hours after procedures) Diagnostic bilateral SI joint block Possible bilateral SI joint RFA Diagnostic bilateral femoral nerve + obturator NB Possible bilateral femoral nerve + obturator nerve RFA Diagnostic right IA shoulder joint injection   Palliative PRN treatment(s):   Palliative left CESI #3  Palliative left trochanteric bursa injection #2  Palliative right IA hip inj. #2  (100% on the right) Palliative left IA hip inj. #3   Palliative right lumbar facet block #3 Palliative left lumbar facet block #5 Palliative right lumbar facet RFA #2 (Last done on 09/19/18) Palliative left lumbar facet RFA #4 (Last done on 08/01/18) Palliative bilateral suprascapular nerve block Palliative bilateral suprascapular nerve RFA #2(Last done 07/13/18)    Recent Visits Date Type Provider Dept  06/04/19 Office Visit Milinda Pointer, MD [facility]  04/12/19 Office Visit Milinda Pointer, MD [facility]  03/27/19 Procedure visit Milinda Pointer, MD [facility]  Showing recent visits within past 90 days and meeting all other requirements   Today's Visits Date Type Provider Dept  06/12/19 Procedure visit Milinda Pointer, MD [facility]  Showing today's visits and  meeting all other requirements   Future Appointments Date Type Provider Dept  07/02/19 Appointment Milinda Pointer, MD [facility]  08/29/19 Appointment Milinda Pointer, MD [facility]  Showing future appointments within next 90 days and meeting all other requirements   Disposition: Discharge home  Discharge Date & Time: 06/12/2019; 1125 hrs.   Primary Care Physician: Mikey College, NP Location: [facility] Note by: Gaspar Cola, MD Date: 06/12/2019; Time: 1:16 PM  Disclaimer:  Medicine is not an exact science. The only guarantee in medicine is that nothing is guaranteed. It is important to note that the decision to proceed with this intervention was based on the information collected from the patient. The Data and conclusions were drawn from the patient's questionnaire, the interview, and the physical examination. Because the information was provided in large part by the patient, it cannot be guaranteed that it has not been purposely or unconsciously manipulated. Every effort has been made to obtain as much relevant data as possible for this evaluation. It is important to note that the conclusions that lead to this procedure are derived in large part from the available data. Always take into account that the treatment will also be dependent on availability of resources and existing treatment guidelines, considered by other Pain Management Practitioners as being common knowledge and practice, at the time of the intervention. For Medico-Legal purposes, it is also important to point out that variation in procedural techniques and pharmacological choices are the acceptable norm. The indications, contraindications, technique, and results of the above procedure should only be interpreted and judged by a Board-Certified Interventional Pain Specialist with extensive familiarity and expertise in the same exact procedure and technique.

## 2019-06-12 NOTE — Patient Instructions (Signed)

## 2019-06-13 ENCOUNTER — Telehealth: Payer: Self-pay

## 2019-06-13 NOTE — Telephone Encounter (Signed)
Post procedure phone call.  Patient states she is doing well.  

## 2019-06-15 ENCOUNTER — Other Ambulatory Visit: Payer: Self-pay | Admitting: Pharmacy Technician

## 2019-06-15 NOTE — Patient Outreach (Signed)
[facility]) Care Management  06/15/2019  Kristin [name] 1930-06-04 [id number]  Successful outreach call placed to patient in regards to BMS application for Eliquis.  Spoke to patient, HIPAA identifiers verified.  Patient informed she received a 90 days supply of medication. Discussed refill procedure with patient. Patient verbalized understanding. Confirmed patient had name and number as she informs she has no other questions or concerns at this time.  Will route note to embedded [facility] RPh Harlow Asa that patient assistance has been completed and will remove myself from care team.  Luiz Ochoa. Nozomi Mettler, [place] [facility] [phone]

## 2019-06-28 ENCOUNTER — Ambulatory Visit: Payer: Medicare Other | Admitting: Pharmacist

## 2019-06-28 ENCOUNTER — Encounter: Payer: Self-pay | Admitting: Pain Medicine

## 2019-06-28 DIAGNOSIS — J302 Other seasonal allergic rhinitis: Secondary | ICD-10-CM

## 2019-06-28 DIAGNOSIS — I4891 Unspecified atrial fibrillation: Secondary | ICD-10-CM

## 2019-06-28 NOTE — Progress Notes (Signed)
Pain relief after procedure (treated area only): (Questions asked to patient) 1. About 15 minutes after the procedure, while the area was still numb from the local anesthetics, were you having any pain in the area? First 1 hour: 100 % better. Initial 4-6 hours: 100 % better. 2. How many days was the area numb? Any benefit longer than 6 hours: 100 % better. 3. How much better is your pain now, when compared to before the procedure? Current benefit: 75 % better.  Patient states that the pain is better she has weakness in the back and has not strenght. 4. Can you move better now? Improvement in ROM (Range of Motion): Yes. 5. Can you do more now? Improvement in function: No. 4. Did you have any problems with the procedure? Side-effects/Complications: weakness in the back.Marland Kitchen

## 2019-06-28 NOTE — Chronic Care Management (AMB) (Signed)
Chronic Care Management   Follow Up Note   06/28/2019 Name: Kristin [name] MRN: [id number] DOB: Aug 15, 1930  Referred by: Kristin College, NP (Inactive) Reason for referral : Chronic Care Management (Patient Phone Call)   Kristin [name] is a 85 y.o. year old female who is a primary care patient of Kristin College, NP (Inactive). The CCM team was consulted for assistance with chronic disease management and care coordination needs. Kristin [name] has a past medical history including but not limited to hypertension, CKD, Chronic heart failure with preserved ejection fraction, COPD, atrial fibrillation, seasonal allergies, chronic pain, restless leg syndrome and insomnia.   I reached out to Kristin [name] by phone today.   Review of patient status, including review of consultants reports, relevant laboratory and other test results, and collaboration with appropriate care team members and the patient's provider was performed as part of comprehensive patient evaluation and provision of chronic care management services.     Outpatient Encounter Medications as of 06/28/2019  Medication Sig Note  . apixaban (ELIQUIS) 5 MG TABS tablet Take 1 tablet (5 mg total) by mouth 2 (two) times daily.   Marland Kitchen levocetirizine (XYZAL) 5 MG tablet Take 0.5 tablets (2.5 mg total) by mouth every other day.   Marland Kitchen acetaminophen (TYLENOL) 500 MG tablet Take 500 mg by mouth 3 (three) times daily.   Marland Kitchen albuterol (PROVENTIL) (2.5 MG/3ML) 0.083% nebulizer solution Take 3 mLs (2.5 mg total) by nebulization every 8 (eight) hours as needed for wheezing or shortness of breath.   . bisacodyl (DULCOLAX) 5 MG EC tablet Take 2 tablets (10 mg total) by mouth daily as needed for moderate constipation.   . Cholecalciferol (VITAMIN D) 2000 units tablet Take 2,000 Units by mouth daily.    . furosemide (LASIX) 20 MG tablet TAKE (1/2) TABLET BY MOUTH ONCE DAILY.   Marland Kitchen lisinopril (ZESTRIL) 10 MG tablet Take 1 tablet (10 mg total) by mouth daily.    Derrill Memo ON 08/01/2019] Melatonin 10 MG CAPS Take 10 mg by mouth at bedtime as needed.   . NONFORMULARY OR COMPOUNDED ITEM Sig: Apply 1-2 gm(s) (2-4 pumps) to affected area, 3-4 times/day. (1 pump = 0.5 gm)   . Oxycodone HCl 20 MG TABS Take 1 tablet (20 mg total) by mouth every 8 (eight) hours as needed. Must last 30 days   . [START ON 07/02/2019] Oxycodone HCl 20 MG TABS Take 1 tablet (20 mg total) by mouth every 8 (eight) hours as needed. Must last 30 days   . [START ON 08/01/2019] Oxycodone HCl 20 MG TABS Take 1 tablet (20 mg total) by mouth every 8 (eight) hours as needed. Must last 30 days   . OXYGEN Inhale 2 L into the lungs at bedtime.  04/25/2018: At night as needed  . polyethylene glycol (MIRALAX / GLYCOLAX) 17 g packet Take 17 g by mouth daily as needed for moderate constipation. Must last 30 days   . rOPINIRole (REQUIP) 2 MG tablet Take 2 tablets (4 mg total) by mouth at bedtime. Must last 30 days   . [START ON 08/01/2019] Wheat Dextrin (BENEFIBER) POWD Take 6 g by mouth 3 (three) times daily before meals. (2 tsp = 6 g)    No facility-administered encounter medications on file as of 06/28/2019.     Goals Addressed            This Visit's Progress   . PharmD - Kristin [name]       Current Barriers:  .  Financial Barriers - currently in coverage gap of Part D coverage  Pharmacist Clinical Goal(s):  Marland Kitchen Over the next 30 days, patient will work with CM Pharmacist to address needs related to medication assistance  Interventions: . Follow up with patient regarding importance of medication adherence o Again encourage patient to use weekly pillbox, particularly to aid with adherence to every other day dosing - Kristin [name] states that she will pick one up from local pharmacy . Collaborated with Kristin [name] to assist patient with applying for patient assistance for Eliquis through [employer] o Patient approved and confirmed has received supply o Review process for reapplying  for 2021, including need to meet out of pocket expenditure requirement . Advise patient that Kristin [name] is available in the office to contact for any medical questions/concerns. Note patient's PCP has recently left practice.  Patient Self Care Activities:  . Attends all scheduled provider appointments . Calls pharmacy for medication refills . Calls provider office for new concerns or questions  Please see past updates related to this goal by clicking on the "Past Updates" button in the selected goal         Plan  The care management team will reach out to the patient again over the next 30 days.   Kristin [name], PharmD, [place] [employer] [phone]

## 2019-06-28 NOTE — Patient Instructions (Signed)
Thank you allowing the Chronic Care Management Team to be a part of your care! It was a pleasure speaking with you today!     CCM (Chronic Care Management) Team    Janci Minor RN, BSN Nurse Care Coordinator  [phone]   Harlow Asa PharmD  Clinical Pharmacist  [phone]   Eula Fried LCSW Clinical Social Worker [phone]  Visit Information  Goals Addressed            This Visit's Progress   . PharmD - [place]       Current Barriers:  . Financial Barriers - currently in coverage gap of Part D coverage  Pharmacist Clinical Goal(s):  Marland Kitchen Over the next 30 days, patient will work with CM Pharmacist to address needs related to medication assistance  Interventions: . Follow up with patient regarding importance of medication adherence o Again encourage patient to use weekly pillbox, particularly to aid with adherence to every other day dosing - Ms. Mroczkowski states that she will pick one up from local pharmacy . Collaborated with [facility] to assist patient with applying for patient assistance for Eliquis through [employer] o Patient approved and confirmed has received supply o Review process for reapplying for 2021, including need to meet out of pocket expenditure requirement . Advise patient that Dr. Parks Ranger is available in the office to contact for any medical questions/concerns. Note patient's PCP has recently left practice.  Patient Self Care Activities:  . Attends all scheduled provider appointments . Calls pharmacy for medication refills . Calls provider office for new concerns or questions  Please see past updates related to this goal by clicking on the "Past Updates" button in the selected goal         The patient verbalized understanding of instructions provided today and declined a print copy of patient instruction materials.   The care management team will reach out to the patient again over the next 30 days.    Harlow Asa, PharmD, [place] [employer] [phone]

## 2019-07-01 NOTE — Progress Notes (Signed)
Pain Management Virtual Encounter Note - Virtual Visit via Telephone Telehealth (real-time audio visits between healthcare provider and patient).   Patient's Phone No. & Preferred Pharmacy:  [phone] (home); There is no such number on file (mobile).; (Preferred) [phone] [email or web address]  [place], [place] - [address] [place] [address] Phone: [phone] Fax: [phone]    Pre-screening note:  Our staff contacted Kristin [name] and offered her an "in person", "face-to-face" appointment versus a telephone encounter. She indicated preferring the telephone encounter, at this time.   Reason for Virtual Visit: COVID-19*  Social distancing based on CDC and AMA recommendations.   I contacted Kristin [name] on 07/02/2019 via telephone.      I clearly identified myself as Gaspar Cola, MD. I verified that I was speaking with the correct person using two identifiers (Name: Kristin [name], and date of birth: 1930-08-12).  Advanced Informed Consent I sought verbal advanced consent from Kristin [name] for virtual visit interactions. I informed Kristin [name] of possible security and privacy concerns, risks, and limitations associated with providing "not-in-person" medical evaluation and management services. I also informed Kristin [name] of the availability of "in-person" appointments. Finally, I informed her that there would be a charge for the virtual visit and that she could be  personally, fully or partially, financially responsible for it. Kristin [name] expressed understanding and agreed to proceed.   Historic Elements   Kristin [name] is a 85 y.o. year old, female patient evaluated today after her last encounter by our practice on 06/13/2019. Kristin [name]  has a past medical history of Acute postoperative pain (02/09/2018), Arrhythmia, sinus node (04/03/2014), Arthritis, Arthritis, Atrial fibrillation ([place]), Back pain, Bradycardia, Breast  cancer ([place]) (2004), Cancer ([place]) (2004), CHF (congestive heart failure) ([place]), Chronic back pain, CKD (chronic kidney disease), COPD (chronic obstructive pulmonary disease) ([place]), Cystocele, Decubitus ulcers, Dehydration, Gout, Hematuria, Hepatitis C, Hyperlipidemia, Hypertension, Hypomagnesemia (06/25/2015), OAB (overactive bladder), Overactive bladder, Restless leg, Shoulder pain, bilateral, Urinary frequency, UTI (lower urinary tract infection), and Vaginal atrophy. She also  has a past surgical history that includes Abdominal hysterectomy; Back surgery; Rotator cuff repair; Breast surgery; Cataract extraction w/PHACO (10/19/2011); Cataract extraction w/PHACO (11/02/2011); Carpal tunnel release; Joint replacement; Colon surgery; Pacemaker insertion (N/A, 12/24/2015); Cholecystectomy; Breast lumpectomy (Right, 2004); and Insert / replace / remove pacemaker. Kristin [name] has a current medication list which includes the following prescription(s): acetaminophen, albuterol, apixaban, bisacodyl, vitamin d, furosemide, levocetirizine, lisinopril, melatonin, NONFORMULARY OR COMPOUNDED ITEM, oxycodone hcl, oxycodone hcl, oxycodone hcl, oxycodone hcl, oxygen-helium, polyethylene glycol, ropinirole, and benefiber. She  reports that she quit smoking about 20 years ago. Her smoking use included cigarettes. She has a 50.00 pack-year smoking history. She has never used smokeless tobacco. She reports that she does not drink alcohol or use drugs. Kristin [name] is allergic to flexeril [cyclobenzaprine hcl] and vancomycin.   HPI  Today, she is being contacted for both, medication management and a post-procedure assessment.  The patient indicates doing well with the current medication regimen. No adverse reactions or side effects reported to the medications.   Post-Procedure Evaluation  Procedure: Diagnostic/therapeutic left lumbar facet block #4 + left sacroiliac joint block #1 + left intra-articular hip joint injection #2 under  fluoroscopic guidance and IV sedation Pre-procedure pain level: 6/10 Post-procedure: 0/10 (100% relief)  Sedation: Sedation provided.  Pain relief after procedure (treated area only): (Questions asked to patient) 1. About 15 minutes after the procedure, while the area  was still numb from the local anesthetics, were you having any pain in the area? First 1 hour: 100 % better. Initial 4-6 hours: 100 % better. 2. How many days was the area numb? Any benefit longer than 6 hours: 100 % better. 3. How much better is your pain now, when compared to before the procedure? Current benefit: 75 % better.  Patient states that the pain is better she has weakness in the back and has not strenght. 4. Can you move better now? Improvement in ROM (Range of Motion): Yes. 5. Can you do more now? Improvement in function: No. 4. Did you have any problems with the procedure? Side-effects/Complications: weakness in the back..  Current benefits: Defined as benefit that persist at this time.   Analgesia:  >75% relief Function: Kristin [name] reports improvement in function ROM: No improvement  Pharmacotherapy Assessment  Analgesic: Oxycodone IR 20 mg, 1 tab PO q 8 hrs (60 mg/day of oxycodone) MME/day: 90 mg/day.   Monitoring: Pharmacotherapy: No side-effects or adverse reactions reported. [place] PMP: PDMP reviewed during this encounter.       Compliance: No problems identified. Effectiveness: Clinically acceptable. Plan: Refer to "POC".  UDS:  Summary  Date Value Ref Range Status  02/01/2018 FINAL  Final    Comment:    ==================================================================== TOXASSURE SELECT 13 (MW) ==================================================================== Test                             Result       Flag       Units Drug Present and Declared for Prescription Verification   Oxycodone                      401          EXPECTED   ng/mg creat   Oxymorphone                    438           EXPECTED   ng/mg creat   Noroxycodone                   1122         EXPECTED   ng/mg creat   Noroxymorphone                 309          EXPECTED   ng/mg creat    Sources of oxycodone are scheduled prescription medications.    Oxymorphone, noroxycodone, and noroxymorphone are expected    metabolites of oxycodone. Oxymorphone is also available as a    scheduled prescription medication. ==================================================================== Test                      Result    Flag   Units      Ref Range   Creatinine              79               mg/dL      >=20 ==================================================================== Declared Medications:  The flagging and interpretation on this report are based on the  following declared medications.  Unexpected results may arise from  inaccuracies in the declared medications.  **Note: The testing scope of this panel includes these medications:  Oxycodone  **Note: The testing scope of this panel does not include following  reported medications:  Albuterol (  Proventil)  Apixaban  Bisacodyl  Furosemide (Lasix)  Lisinopril  Melatonin  Polyethylene Glycol (MiraLAX)  Ropinirole  Vitamin D ==================================================================== For clinical consultation, please call [phone]. ====================================================================    Laboratory Chemistry Profile (12 mo)  Renal: 05/08/2019: BUN 27; BUN/Creatinine Ratio 22; Creat 1.22  Lab Results  Component Value Date   GFRAA 46 (L) 05/08/2019   GFRNONAA 40 (L) 05/08/2019   Hepatic: No results found for requested labs within last 8760 hours. Lab Results  Component Value Date   AST 21 07/25/2018   ALT 11 07/25/2018   Other: No results found for requested labs within last 8760 hours. Note: Above Lab results reviewed.  Imaging  Last 90 days:  Dg Pain Clinic C-arm 1-60 Min No Report  Result Date: 06/12/2019 Fluoro  was used, but no Radiologist interpretation will be provided. Please refer to "NOTES" tab for provider progress note.   Assessment  The primary encounter diagnosis was Lumbar facet syndrome (Bilateral) (L>R). Diagnoses of Spondylosis without myelopathy or radiculopathy, lumbosacral region, Lumbar facet hypertrophy (L1-2, L2-3, and L4-5), DDD (degenerative disc disease), lumbosacral, Chronic pain syndrome, Insomnia, unspecified type, Opioid-induced constipation (OIC), Osteoarthritis involving multiple joints, and Restless leg syndrome were also pertinent to this visit.  Plan of Care  I am having Kristin [name] start on Oxycodone HCl. I am also having her maintain her apixaban, OXYGEN, Vitamin D, albuterol, acetaminophen, Oxycodone HCl, furosemide, lisinopril, levocetirizine, Benefiber, Oxycodone HCl, NONFORMULARY OR COMPOUNDED ITEM, Oxycodone HCl, Melatonin, bisacodyl, polyethylene glycol, and rOPINIRole.  Pharmacotherapy (Medications Ordered): Meds ordered this encounter  Medications  . Oxycodone HCl 20 MG TABS    Sig: Take 1 tablet (20 mg total) by mouth every 8 (eight) hours as needed. Must last 30 days    Dispense:  90 tablet    Refill:  0    Chronic Pain: STOP Act (Not applicable) Fill 1 day early if closed on refill date. Do not fill until: 08/01/2019. To last until: 08/31/2019. Avoid benzodiazepines within 8 hours of opioids  . Melatonin 10 MG CAPS    Sig: Take 10 mg by mouth at bedtime as needed.    Dispense:  90 capsule    Refill:  3    Fill one day early if pharmacy is closed on scheduled refill date. May substitute for generic or OTC, if available.  . bisacodyl (DULCOLAX) 5 MG EC tablet    Sig: Take 2 tablets (10 mg total) by mouth daily as needed for moderate constipation.    Dispense:  60 tablet    Refill:  11    Fill one day early if pharmacy is closed on scheduled refill date. May substitute for generic if available.  . polyethylene glycol (MIRALAX / GLYCOLAX) 17 g packet     Sig: Take 17 g by mouth daily as needed for moderate constipation. Must last 30 days    Dispense:  30 packet    Refill:  11    Fill one day early if pharmacy is closed on scheduled refill date. May substitute for generic if available.  Marland Kitchen rOPINIRole (REQUIP) 2 MG tablet    Sig: Take 2 tablets (4 mg total) by mouth at bedtime. Must last 30 days    Dispense:  60 tablet    Refill:  5    Fill one day early if pharmacy is closed on scheduled refill date. May substitute for generic if available.  . Oxycodone HCl 20 MG TABS    Sig: Take 1 tablet (20 mg total)  by mouth every 8 (eight) hours as needed. Must last 30 days    Dispense:  90 tablet    Refill:  0    Chronic Pain: STOP Act (Not applicable) Fill 1 day early if closed on refill date. Do not fill until: 08/31/2019. To last until: 09/30/2019. Avoid benzodiazepines within 8 hours of opioids   Orders:  No orders of the defined types were placed in this encounter.  Follow-up plan:   Return in about 3 months (around 09/26/2019) for (VV), (MM).      Interventional management options:  Considering:   NOTE: ELIQUIS ANTICOAGULATION (Stop: x3 days prior to procedure  Restart 6 hours after procedures) Diagnostic bilateral SI joint block Possible bilateral SI joint RFA Diagnostic bilateral femoral nerve + obturator NB Possible bilateral femoral nerve + obturator nerve RFA Diagnostic right IA shoulder joint injection   Palliative PRN treatment(s):   Palliative left CESI #3  Palliative left trochanteric bursa injection #2  Palliative right IA hip inj. #2  (100% on the right) Palliative left IA hip inj. #3   Palliative right lumbar facet block #3 Palliative left lumbar facet block #5 Palliative right lumbar facet RFA #2 (Last done on 09/19/18) Palliative left lumbar facet RFA #4 (Last done on 08/01/18) Palliative bilateral suprascapular nerve block Palliative bilateral suprascapular nerve RFA #2(Last done 07/13/18)     Recent  Visits Date Type Provider Dept  06/12/19 Procedure visit Milinda Pointer, MD [facility]  06/04/19 Office Visit Milinda Pointer, [facility]  04/12/19 Office Visit Milinda Pointer, MD [facility]  Showing recent visits within past 90 days and meeting all other requirements   Today's Visits Date Type Provider Dept  07/02/19 Telemedicine Milinda Pointer, MD [facility]  Showing today's visits and meeting all other requirements   Future Appointments Date Type Provider Dept  08/29/19 Appointment Milinda Pointer, MD [facility]  Showing future appointments within next 90 days and meeting all other requirements   I discussed the assessment and treatment plan with the patient. The patient was provided an opportunity to ask questions and all were answered. The patient agreed with the plan and demonstrated an understanding of the instructions.  Patient advised to call back or seek an in-person evaluation if the symptoms or condition worsens.  Total duration of non-face-to-face encounter: 12 minutes.  Note by: Gaspar Cola, MD Date: 07/02/2019; Time: 4:54 PM  Note: This dictation was prepared with Dragon dictation. Any transcriptional errors that may result from this process are unintentional.  Disclaimer:  * Given the special circumstances of the COVID-19 pandemic, the federal government has announced that the Office for Civil Rights (OCR) will exercise its enforcement discretion and will not impose penalties on physicians using telehealth in the event of noncompliance with regulatory requirements under the [place] and [place] (HIPAA) in connection with the good faith provision of telehealth during the PYKDX-83 national public health emergency. ([place])

## 2019-07-02 ENCOUNTER — Other Ambulatory Visit: Payer: Self-pay

## 2019-07-02 ENCOUNTER — Ambulatory Visit: Payer: Medicare Other | Attending: Pain Medicine | Admitting: Pain Medicine

## 2019-07-02 DIAGNOSIS — G894 Chronic pain syndrome: Secondary | ICD-10-CM

## 2019-07-02 DIAGNOSIS — M5137 Other intervertebral disc degeneration, lumbosacral region: Secondary | ICD-10-CM

## 2019-07-02 DIAGNOSIS — G4700 Insomnia, unspecified: Secondary | ICD-10-CM

## 2019-07-02 DIAGNOSIS — M47817 Spondylosis without myelopathy or radiculopathy, lumbosacral region: Secondary | ICD-10-CM | POA: Diagnosis not present

## 2019-07-02 DIAGNOSIS — K5903 Drug induced constipation: Secondary | ICD-10-CM

## 2019-07-02 DIAGNOSIS — M47816 Spondylosis without myelopathy or radiculopathy, lumbar region: Secondary | ICD-10-CM | POA: Diagnosis not present

## 2019-07-02 DIAGNOSIS — M159 Polyosteoarthritis, unspecified: Secondary | ICD-10-CM

## 2019-07-02 DIAGNOSIS — T402X5A Adverse effect of other opioids, initial encounter: Secondary | ICD-10-CM

## 2019-07-02 DIAGNOSIS — G2581 Restless legs syndrome: Secondary | ICD-10-CM

## 2019-07-02 DIAGNOSIS — M8949 Other hypertrophic osteoarthropathy, multiple sites: Secondary | ICD-10-CM

## 2019-07-02 MED ORDER — POLYETHYLENE GLYCOL 3350 17 G PO PACK: 17.0000 g | PACK | Freq: Every day | ORAL | 11 refills | Status: DC | PRN

## 2019-07-02 MED ORDER — OXYCODONE HCL 20 MG PO TABS: 20.0000 mg | ORAL_TABLET | Freq: Three times a day (TID) | ORAL | 0 refills | Status: DC | PRN

## 2019-07-02 MED ORDER — ROPINIROLE HCL 2 MG PO TABS: 4.0000 mg | ORAL_TABLET | Freq: Every day | ORAL | 5 refills | Status: DC

## 2019-07-02 MED ORDER — BISACODYL 5 MG PO TBEC: 10.0000 mg | DELAYED_RELEASE_TABLET | Freq: Every day | ORAL | 11 refills | Status: DC | PRN

## 2019-07-02 MED ORDER — MELATONIN 10 MG PO CAPS: 10.0000 mg | ORAL_CAPSULE | Freq: Every evening | ORAL | 3 refills | Status: DC | PRN

## 2019-07-24 ENCOUNTER — Ambulatory Visit (INDEPENDENT_AMBULATORY_CARE_PROVIDER_SITE_OTHER): Payer: Medicare Other | Admitting: Pharmacist

## 2019-07-24 DIAGNOSIS — Z599 Problem related to housing and economic circumstances, unspecified: Secondary | ICD-10-CM

## 2019-07-24 DIAGNOSIS — I10 Essential (primary) hypertension: Secondary | ICD-10-CM

## 2019-07-24 DIAGNOSIS — Z598 Other problems related to housing and economic circumstances: Secondary | ICD-10-CM

## 2019-07-24 NOTE — Patient Instructions (Signed)
Thank you allowing the Chronic Care Management Team to be a part of your care! It was a pleasure speaking with you today!     CCM (Chronic Care Management) Team    Janci Minor RN, BSN Nurse Care Coordinator  [phone]   Harlow Asa PharmD  Clinical Pharmacist  [phone]   Eula Fried LCSW Clinical Social Worker [phone]  Visit Information  Goals Addressed            This Visit's Progress   . PharmD - [place]       Current Barriers:  . Financial Barriers - currently in coverage gap of Part D coverage  Pharmacist Clinical Goal(s):  Marland Kitchen Over the next 30 days, patient will work with CM Pharmacist to address needs related to medication assistance  Interventions: . Follow up with patient regarding importance of medication adherence o Again encourage patient to use weekly pillbox, particularly to aid with adherence to every other day dosing - Ms. Staller states that she has not yet picked one up - Encourage patient to order weekly pillbox through health plan over the counter benefit . Patient states she uses this benefit, has the catalogue and will include a weekly pillbox in her December order . Counsel patient regarding benefit of keeping record of blood pressure readings o Reports checking blood pressure at home occasionally (has upper arm monitor) and that readings have been "good", but denies keeping record o Encourage patient to continue checking BP, keep log and bring record with her to medical appointments . Send coordination of care message to [place] to follow up regarding referral for resources from 10/7.  Patient Self Care Activities:  . Attends all scheduled provider appointments . Calls pharmacy for medication refills . Calls provider office for new concerns or questions  Please see past updates related to this goal by clicking on the "Past Updates" button in the selected goal         The patient verbalized  understanding of instructions provided today and declined a print copy of patient instruction materials.   The care management team will reach out to the patient again over the next 60 days.   Harlow Asa, PharmD, [place] [employer] [phone]

## 2019-07-24 NOTE — Chronic Care Management (AMB) (Signed)
Chronic Care Management   Follow Up Note   07/24/2019 Name: LAVONIA EAGER MRN: [id number] DOB: [birth year]  Referred by: Mikey College, NP (Inactive) Reason for referral : Chronic Care Management (Patient Phone Call) and Care Coordination   BERNEDETTE AUSTON is a 85 y.o. year old female who is a primary care patient of Mikey College, NP (Inactive). The CCM team was consulted for assistance with chronic disease management and care coordination needs. Ms. Bannister has a past medical history including but not limited to hypertension, CKD, Chronic heart failure with preserved ejection fraction, COPD, atrial fibrillation, seasonal allergies, chronic pain, restless leg syndrome and insomnia.     I reached out to [name] by phone today.   Review of patient status, including review of consultants reports, relevant laboratory and other test results, and collaboration with appropriate care team members and the patient's provider was performed as part of comprehensive patient evaluation and provision of chronic care management services.     Outpatient Encounter Medications as of 07/24/2019  Medication Sig Note  . acetaminophen (TYLENOL) 500 MG tablet Take 500 mg by mouth 3 (three) times daily.   Marland Kitchen albuterol (PROVENTIL) (2.5 MG/3ML) 0.083% nebulizer solution Take 3 mLs (2.5 mg total) by nebulization every 8 (eight) hours as needed for wheezing or shortness of breath.   Marland Kitchen apixaban (ELIQUIS) 5 MG TABS tablet Take 1 tablet (5 mg total) by mouth 2 (two) times daily.   Derrill Memo ON 08/01/2019] bisacodyl (DULCOLAX) 5 MG EC tablet Take 2 tablets (10 mg total) by mouth daily as needed for moderate constipation.   . Cholecalciferol (VITAMIN D) 2000 units tablet Take 2,000 Units by mouth daily.    . furosemide (LASIX) 20 MG tablet TAKE (1/2) TABLET BY MOUTH ONCE DAILY.   Marland Kitchen levocetirizine (XYZAL) 5 MG tablet Take 0.5 tablets (2.5 mg total) by mouth every other day.   . lisinopril (ZESTRIL) 10 MG  tablet Take 1 tablet (10 mg total) by mouth daily.   Derrill Memo ON 08/01/2019] Melatonin 10 MG CAPS Take 10 mg by mouth at bedtime as needed.   . NONFORMULARY OR COMPOUNDED ITEM Sig: Apply 1-2 gm(s) (2-4 pumps) to affected area, 3-4 times/day. (1 pump = 0.5 gm)   . Oxycodone HCl 20 MG TABS Take 1 tablet (20 mg total) by mouth every 8 (eight) hours as needed. Must last 30 days   . [START ON 08/01/2019] Oxycodone HCl 20 MG TABS Take 1 tablet (20 mg total) by mouth every 8 (eight) hours as needed. Must last 30 days   . [START ON 08/01/2019] Oxycodone HCl 20 MG TABS Take 1 tablet (20 mg total) by mouth every 8 (eight) hours as needed. Must last 30 days   . [START ON 08/31/2019] Oxycodone HCl 20 MG TABS Take 1 tablet (20 mg total) by mouth every 8 (eight) hours as needed. Must last 30 days   . OXYGEN Inhale 2 L into the lungs at bedtime.  04/25/2018: At night as needed  . [START ON 08/01/2019] polyethylene glycol (MIRALAX / GLYCOLAX) 17 g packet Take 17 g by mouth daily as needed for moderate constipation. Must last 30 days   . [START ON 08/01/2019] rOPINIRole (REQUIP) 2 MG tablet Take 2 tablets (4 mg total) by mouth at bedtime. Must last 30 days   . [START ON 08/01/2019] Wheat Dextrin (BENEFIBER) POWD Take 6 g by mouth 3 (three) times daily before meals. (2 tsp = 6 g)    No  facility-administered encounter medications on file as of 07/24/2019.     Goals Addressed            This Visit's Progress   . PharmD - [place]       Current Barriers:  . Financial Barriers - currently in coverage gap of Part D coverage  Pharmacist Clinical Goal(s):  Marland Kitchen Over the next 30 days, patient will work with CM Pharmacist to address needs related to medication assistance  Interventions: . Follow up with patient regarding importance of medication adherence o Again encourage patient to use weekly pillbox, particularly to aid with adherence to every other day dosing - Ms. Martis states that she has not yet picked  one up - Encourage patient to order weekly pillbox through health plan over the counter benefit . Patient states she uses this benefit, has the catalogue and will include a weekly pillbox in her December order . Counsel patient regarding benefit of keeping record of blood pressure readings o Reports checking blood pressure at home occasionally (has upper arm monitor) and that readings have been "good", but denies keeping record o Encourage patient to continue checking BP, keep log and bring record with her to medical appointments . Send coordination of care message to [place] to follow up regarding referral for resources from 10/7.  Patient Self Care Activities:  . Attends all scheduled provider appointments . Calls pharmacy for medication refills . Calls provider office for new concerns or questions  Please see past updates related to this goal by clicking on the "Past Updates" button in the selected goal         Plan  The care management team will reach out to the patient again over the next 60 days.   Harlow Asa, PharmD, [place] [employer] [phone]

## 2019-07-27 ENCOUNTER — Telehealth: Payer: Self-pay | Admitting: Nurse Practitioner

## 2019-07-27 NOTE — Telephone Encounter (Signed)
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 She is not eligible for SNAP benefits but has [employer]. She stated that she is doing okay preparing meals for herself but would welcome Meals on Wheels to help with making sure she has enough food to last her through the month. She does go to the [employer] at the [employer] near her along with the [employer] on Tuesday afternoons.  She has family that lives down the road that checks in on her frequently - Kristin [name] helps her with her finances and keeping up with other personal needs and her son picks up groceries for her as she does not go into the store due to COVID-19 -Will place a referral to [employer] [place] [employer] on Wheels for Kristin [name] -Will email Kristin list of community guide for [place] so that she can have it handy patient stated that she is doing   [place] Management ??[email or web address]   ??[phone]

## 2019-07-27 NOTE — Telephone Encounter (Signed)
From: Jill Alexanders [facility])  Sent: Friday, July 27, 2019 2:06 PM To: '[email or web address]> Subject: SECURE: Community Resources for Ms. 05-26-1972 Afternoon [name], Your grandmother asked that I email you this information in regards to community resources for food. Could you please pass this along to her?  Attachment: Food Julien Nordmann for Physicist, medical Attachment: [place] (they no longer update the .pdf of this guide, but there is a database on their website - http://caswellcares.com/community-guide-to-assistance/ search by keyword.  I will also place a referral to Meals on Wheels of [facility] and will follow up with Ms. Demilio to let her know the status once I hear back from them.  Thank you,   [place] Management ??[email or web address]   ??Curt Bears

## 2019-07-27 NOTE — Telephone Encounter (Signed)
° ° °  Kristin [name] ([place] # was disconnected) to add Ms. Sizer to their waitlist. [place] Management ??[email or web address]   ??[phone]

## 2019-08-09 ENCOUNTER — Telehealth: Payer: Self-pay | Admitting: Family Medicine

## 2019-08-09 NOTE — Telephone Encounter (Signed)
Pt called requesting to change PCP to you I did tell her its okay if not please let me know Thanks

## 2019-08-10 NOTE — Telephone Encounter (Signed)
Needs to wait for Dr. Raliegh Ip

## 2019-08-13 NOTE — Telephone Encounter (Signed)
It is okay for her to switch to me as PCP. For next visit since she is requesting to change in advance, I would ask that she schedule an "establish care visit" so that we could review her previous medical history without new medical problems - prefer 40 min, like a new patient visit.  This could even be virtual if needed, could do 20 min if virtual time slot on phone.  [name], DO [facility] 08/13/2019, 4:44 PM

## 2019-08-13 NOTE — Telephone Encounter (Signed)
From: Jill Alexanders [facility])  Sent: Monday, August 13, 2019 2:13 PM To: [email or web address] Subject: SECURE: Meals on Wheels Referral - [facility] 08/13/19 Importance: High  Good Afternoon, Please see patients information below, please let me know if you need anything else.  Patient Name:     Kristin [name]                                     Address:               [place]                                 Phone Number:   [phone]                                                   Last 4 ssn:             [id number]                                    Date of Birth:      1930/03/15                                                         Emergency Contact:        Winfield Rast - Granddaughter [phone]                                    Marital Status:                   Single                                       Household Size:                1                               Race:                                    Black      Language Spoken:           English                                                  What are you doing for meals right now? 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 Any type of paid help in  the home? no Health Concerns: Diabetic?  How many prescription medications a day?  Length of Meal Delivery: please add to MOW wait list Preferred Start Date: ASAP  Thank you for your assistance,  [place] . Embedded Care Coordination [place]  Care Management ??[email or web address]  ??[phone]

## 2019-08-13 NOTE — Telephone Encounter (Signed)
    Returned phone call to [place] [facility] pt regarding [employer] Referral for [employer] on Wheels and organization that is doing meal boxes. [place] . Embedded Care Coordination [place]  Care Management ??[email or web address]  ??[phone]

## 2019-08-20 NOTE — Telephone Encounter (Signed)
Spoke to the patient and scheduled face to face new patient on 02/09 around 8:00 am for 40 minutes also informed patient due to pandemic might change to virtual.

## 2019-08-29 ENCOUNTER — Encounter: Payer: Medicare Other | Admitting: Pain Medicine

## 2019-09-03 ENCOUNTER — Telehealth: Payer: Self-pay

## 2019-09-03 ENCOUNTER — Ambulatory Visit: Payer: Self-pay | Admitting: Pharmacist

## 2019-09-03 NOTE — Chronic Care Management (AMB) (Signed)
  Chronic Care Management   Follow Up Note   09/03/2019 Name: TANDREA KOMMER MRN: [id number] DOB: 1930/01/12  Referred by: Olin Hauser, DO Reason for referral : Chronic Care Management (Patient Phone Call)   MAIRE GOVAN is a 86 y.o. year old female who is a primary care patient of Olin Hauser, DO. The CCM team was consulted for assistance with chronic disease management and care coordination needs.    Was unable to reach patient via telephone today and unable to leave a message as no voicemail picks up.  Plan  The care management team will reach out to the patient again over the next 30 days.   Harlow Asa, PharmD, [place] [employer] [phone]

## 2019-09-06 NOTE — Telephone Encounter (Signed)
Error

## 2019-09-11 DIAGNOSIS — J45998 Other asthma: Secondary | ICD-10-CM | POA: Diagnosis not present

## 2019-09-20 ENCOUNTER — Ambulatory Visit: Payer: Self-pay | Admitting: Pharmacist

## 2019-09-20 ENCOUNTER — Telehealth: Payer: Self-pay

## 2019-09-20 NOTE — Chronic Care Management (AMB) (Signed)
  Chronic Care Management   Follow Up Note   09/20/2019 Name: Kristin [name] MRN: [id number] DOB: 11-23-1929  Referred by: Olin Hauser, DO Reason for referral : Chronic Care Management (Patient Phone Call)   Kristin [name] is a 86 y.o. year old female who is a primary care patient of Olin Hauser, DO. The CCM team was consulted for assistance with chronic disease management and care coordination needs.    Was unable to reach patient via telephone today and have left HIPAA compliant voicemail asking patient to return my call. (unsuccessful outreach #2)   Plan  The care management team will reach out to the patient again over the next 30 days.   Harlow Asa, PharmD, [place] [employer] [phone]

## 2019-09-25 ENCOUNTER — Encounter: Payer: Self-pay | Admitting: Pain Medicine

## 2019-09-26 ENCOUNTER — Ambulatory Visit: Payer: Medicare Other | Attending: Pain Medicine | Admitting: Pain Medicine

## 2019-09-26 ENCOUNTER — Other Ambulatory Visit: Payer: Self-pay

## 2019-09-26 DIAGNOSIS — M25552 Pain in left hip: Secondary | ICD-10-CM

## 2019-09-26 DIAGNOSIS — M7062 Trochanteric bursitis, left hip: Secondary | ICD-10-CM

## 2019-09-26 DIAGNOSIS — M8949 Other hypertrophic osteoarthropathy, multiple sites: Secondary | ICD-10-CM | POA: Diagnosis not present

## 2019-09-26 DIAGNOSIS — G8929 Other chronic pain: Secondary | ICD-10-CM

## 2019-09-26 DIAGNOSIS — M5442 Lumbago with sciatica, left side: Secondary | ICD-10-CM | POA: Diagnosis not present

## 2019-09-26 DIAGNOSIS — M5412 Radiculopathy, cervical region: Secondary | ICD-10-CM | POA: Diagnosis not present

## 2019-09-26 DIAGNOSIS — M79604 Pain in right leg: Secondary | ICD-10-CM

## 2019-09-26 DIAGNOSIS — G894 Chronic pain syndrome: Secondary | ICD-10-CM

## 2019-09-26 DIAGNOSIS — M5441 Lumbago with sciatica, right side: Secondary | ICD-10-CM

## 2019-09-26 DIAGNOSIS — M159 Polyosteoarthritis, unspecified: Secondary | ICD-10-CM

## 2019-09-26 DIAGNOSIS — M79605 Pain in left leg: Secondary | ICD-10-CM

## 2019-09-26 MED ORDER — NONFORMULARY OR COMPOUNDED ITEM: 2 refills | Status: DC

## 2019-09-26 MED ORDER — OXYCODONE HCL 20 MG PO TABS: 20.0000 mg | ORAL_TABLET | Freq: Three times a day (TID) | ORAL | 0 refills | Status: DC | PRN

## 2019-09-26 NOTE — Progress Notes (Signed)
Patient: Kristin [name]  Service Category: E/M  Provider: Gaspar Cola, MD  DOB: 1929/11/27  DOS: 09/26/2019  Location: Office  MRN: [id number]  Setting: Ambulatory outpatient  Referring Provider: No ref. provider found  Type: Established Patient  Specialty: Interventional Pain Management  PCP: Olin Hauser, DO  Location: Remote location  Delivery: TeleHealth     Virtual Encounter - Pain Management PROVIDER NOTE: Information contained herein reflects review and annotations entered in association with encounter. Interpretation of such information and data should be left to medically-trained personnel. Information provided to patient can be located elsewhere in the medical record under "Patient Instructions". Document created using STT-dictation technology, any transcriptional errors that may result from process are unintentional.    Contact & Pharmacy Preferred: [phone] Home: [phone] (home) Mobile: There is no such number on file (mobile). E-mail: [email or web address]  [place], [address] [place] Phone: [phone] Fax: [phone]   Pre-screening  Kristin [name] offered "in-person" vs "virtual" encounter. She indicated preferring virtual for this encounter.   Reason COVID-19*  Social distancing based on CDC and AMA recommendations.   I contacted [phone] on 09/26/2019 via telephone.      I clearly identified myself as 15/10/2019, MD. I verified that I was speaking with the correct person using two identifiers (Name: Kristin [name], and date of birth: 05/16/1930).  Consent I sought verbal advanced consent from 16/08/1929 for virtual visit interactions. I informed Kristin [name] of possible security and privacy concerns, risks, and limitations associated with providing "not-in-person" medical evaluation and management services. I also informed Kristin [name] of the availability of "in-person"  appointments. Finally, I informed her that there would be a charge for the virtual visit and that she could be  personally, fully or partially, financially responsible for it. Kristin [name] expressed understanding and agreed to proceed.   Historic Elements   Kristin [name] is a 86 y.o. year old, female patient evaluated today after her last encounter by our practice on 06/13/2019. Kristin [name]  has a past medical history of Acute postoperative pain (02/09/2018), Arrhythmia, sinus node (04/03/2014), Arthritis, Arthritis, Atrial fibrillation ([place]), Back pain, Bradycardia, Breast cancer ([place]) (2004), Cancer ([place]) (2004), CHF (congestive heart failure) ([place]), Chronic back pain, CKD (chronic kidney disease), COPD (chronic obstructive pulmonary disease) ([place]), Cystocele, Decubitus ulcers, Dehydration, Gout, Hematuria, Hepatitis C, Hyperlipidemia, Hypertension, Hypomagnesemia (06/25/2015), OAB (overactive bladder), Overactive bladder, Restless leg, Shoulder pain, bilateral, Urinary frequency, UTI (lower urinary tract infection), and Vaginal atrophy. She also  has a past surgical history that includes Abdominal hysterectomy; Back surgery; Rotator cuff repair; Breast surgery; Cataract extraction w/PHACO (10/19/2011); Cataract extraction w/PHACO (11/02/2011); Carpal tunnel release; Joint replacement; Colon surgery; Pacemaker insertion (N/A, 12/24/2015); Cholecystectomy; Breast lumpectomy (Right, 2004); and Insert / replace / remove pacemaker. Kristin [name] has a current medication list which includes the following prescription(s): acetaminophen, albuterol, apixaban, bisacodyl, vitamin d, furosemide, levocetirizine, lisinopril, melatonin, oxycodone hcl, oxygen-helium, polyethylene glycol, ropinirole, and benefiber. She  reports that she quit smoking about 20 years ago. Her smoking use included cigarettes. She has a 50.00 pack-year smoking history. She has never used smokeless tobacco. She reports that she does not drink alcohol or use  drugs. Kristin [name] is allergic to flexeril [cyclobenzaprine hcl] and vancomycin.   HPI  Today, she is being contacted for medication management. The patient indicates doing well with the current medication regimen. No adverse reactions or side  effects reported to the medications.  The patient indicates that lately she has been experiencing more pain in the left hip and would like to come in for a left hip injection.  The last time we injected her left trochanteric bursa was around August 2020.  She has a known recurrent trochanteric bursitis and osteoarthritis of the left hip and therefore we will bring her in to see if we can again get this under better control.  Pharmacotherapy Assessment  Analgesic: Oxycodone IR 20 mg, 1 tab PO q 8 hrs (60 mg/day of oxycodone) MME/day: 90 mg/day.   Monitoring: [place] PMP: PDMP not reviewed this encounter.       Pharmacotherapy: No side-effects or adverse reactions reported. Compliance: No problems identified. Effectiveness: Clinically acceptable. Plan: Refer to "POC".  UDS:  Summary  Date Value Ref Range Status  02/01/2018 FINAL  Final    Comment:    ==================================================================== TOXASSURE SELECT 13 (MW) ==================================================================== Test                             Result       Flag       Units Drug Present and Declared for Prescription Verification   Oxycodone                      401          EXPECTED   ng/mg creat   Oxymorphone                    438          EXPECTED   ng/mg creat   Noroxycodone                   1122         EXPECTED   ng/mg creat   Noroxymorphone                 309          EXPECTED   ng/mg creat    Sources of oxycodone are scheduled prescription medications.    Oxymorphone, noroxycodone, and noroxymorphone are expected    metabolites of oxycodone. Oxymorphone is also available as a    scheduled prescription  medication. ==================================================================== Test                      Result    Flag   Units      Ref Range   Creatinine              79               mg/dL      >=20 ==================================================================== Declared Medications:  The flagging and interpretation on this report are based on the  following declared medications.  Unexpected results may arise from  inaccuracies in the declared medications.  **Note: The testing scope of this panel includes these medications:  Oxycodone  **Note: The testing scope of this panel does not include following  reported medications:  Albuterol (Proventil)  Apixaban  Bisacodyl  Furosemide (Lasix)  Lisinopril  Melatonin  Polyethylene Glycol (MiraLAX)  Ropinirole  Vitamin D ==================================================================== For clinical consultation, please call [phone]. ====================================================================    Laboratory Chemistry Profile (12 mo)  Renal: 05/08/2019: BUN 27; BUN/Creatinine Ratio 22; Creat 1.22  Lab Results  Component Value Date   GFRAA 46 (L) 05/08/2019   GFRNONAA 40 (L) 05/08/2019  Hepatic: No results found for requested labs within last 8760 hours. Lab Results  Component Value Date   AST 21 07/25/2018   ALT 11 07/25/2018   Other: No results found for requested labs within last 8760 hours.  Note: Above Lab results reviewed.  Imaging  DG PAIN CLINIC C-ARM 1-60 MIN NO REPORT Fluoro was used, but no Radiologist interpretation will be provided.  Please refer to "NOTES" tab for provider progress note.   Assessment  The primary encounter diagnosis was Chronic pain syndrome. Diagnoses of Chronic low back pain (Primary Area of Pain) (Bilateral) (L>R), Chronic lower extremity pain (Secondary Area of Pain) (Bilateral) (L>R), Chronic cervical radicular pain (Tertiary Area of Pain) (Bilateral) (L>R), and  Osteoarthritis involving multiple joints were also pertinent to this visit.  Plan of Care  Problem-specific:  No problem-specific Assessment & Plan notes found for this encounter.  I am having [name] maintain her apixaban, OXYGEN, Vitamin D, albuterol, acetaminophen, furosemide, lisinopril, levocetirizine, Benefiber, Melatonin, bisacodyl, polyethylene glycol, rOPINIRole, and Oxycodone HCl.  Pharmacotherapy (Medications Ordered): No orders of the defined types were placed in this encounter.  Orders:  No orders of the defined types were placed in this encounter.  Follow-up plan:   Return in about 13 weeks (around 12/26/2019) for (VV), (MM).      Interventional management options:  Considering:   NOTE: ELIQUIS ANTICOAGULATION (Stop: x3 days prior to procedure  Restart 6 hours after procedures) Diagnostic bilateral SI joint block Possible bilateral SI joint RFA Diagnostic bilateral femoral nerve + obturator NB Possible bilateral femoral nerve + obturator nerve RFA Diagnostic right IA shoulder joint injection   Palliative PRN treatment(s):   Palliative left CESI #3  Palliative left trochanteric bursa injection #2  Palliative right IA hip inj. #2  (100% on the right) Palliative left IA hip inj. #3   Palliative right lumbar facet block #3 Palliative left lumbar facet block #5 Palliative right lumbar facet RFA #2 (Last done on 09/19/18) Palliative left lumbar facet RFA #4 (Last done on 08/01/18) Palliative bilateral suprascapular nerve block Palliative bilateral suprascapular nerve RFA #2(Last done 07/13/18)      Recent Visits Date Type Provider Dept  07/02/19 Telemedicine Milinda Pointer, MD [facility]  Showing recent visits within past 90 days and meeting all other requirements   Today's Visits Date Type Provider Dept  09/26/19 Telemedicine Milinda Pointer, MD [facility]  Showing today's visits and meeting all other requirements    Future Appointments No visits were found meeting these conditions.  Showing future appointments within next 90 days and meeting all other requirements   I discussed the assessment and treatment plan with the patient. The patient was provided an opportunity to ask questions and all were answered. The patient agreed with the plan and demonstrated an understanding of the instructions.  Patient advised to call back or seek an in-person evaluation if the symptoms or condition worsens.  Duration of encounter: 13 minutes.  Note by: Gaspar Cola, MD Date: 09/26/2019; Time: 6:54 AM

## 2019-09-27 ENCOUNTER — Ambulatory Visit (HOSPITAL_BASED_OUTPATIENT_CLINIC_OR_DEPARTMENT_OTHER): Payer: Medicare Other | Admitting: Pain Medicine

## 2019-09-27 ENCOUNTER — Other Ambulatory Visit: Payer: Self-pay

## 2019-09-27 ENCOUNTER — Ambulatory Visit
Admission: RE | Admit: 2019-09-27 | Discharge: 2019-09-27 | Disposition: A | Discharge status: Home / Self Care | Payer: Medicare Other | Source: Ambulatory Visit | Attending: Pain Medicine | Admitting: Pain Medicine

## 2019-09-27 ENCOUNTER — Encounter: Payer: Self-pay | Admitting: Pain Medicine

## 2019-09-27 VITALS — BP 161/88 | HR 62 | Temp 97.1°F | Resp 17 | Ht 67.0 in | Wt 163.0 lb

## 2019-09-27 DIAGNOSIS — G8929 Other chronic pain: Secondary | ICD-10-CM

## 2019-09-27 DIAGNOSIS — M533 Sacrococcygeal disorders, not elsewhere classified: Secondary | ICD-10-CM | POA: Diagnosis not present

## 2019-09-27 DIAGNOSIS — M25552 Pain in left hip: Secondary | ICD-10-CM

## 2019-09-27 DIAGNOSIS — M1612 Unilateral primary osteoarthritis, left hip: Secondary | ICD-10-CM | POA: Diagnosis not present

## 2019-09-27 DIAGNOSIS — Z7901 Long term (current) use of anticoagulants: Secondary | ICD-10-CM | POA: Insufficient documentation

## 2019-09-27 DIAGNOSIS — M5388 Other specified dorsopathies, sacral and sacrococcygeal region: Secondary | ICD-10-CM

## 2019-09-27 DIAGNOSIS — M7062 Trochanteric bursitis, left hip: Secondary | ICD-10-CM | POA: Insufficient documentation

## 2019-09-27 MED ORDER — ROPIVACAINE HCL 2 MG/ML IJ SOLN
INTRAMUSCULAR | Status: AC
  Filled 2019-09-27: qty 10

## 2019-09-27 MED ORDER — LIDOCAINE HCL 2 % IJ SOLN
INTRAMUSCULAR | Status: AC
  Filled 2019-09-27: qty 20

## 2019-09-27 MED ORDER — METHYLPREDNISOLONE ACETATE 80 MG/ML IJ SUSP
80.0000 mg | Freq: Once | INTRAMUSCULAR | Status: AC
  Administered 2019-09-27: 80 mg via INTRA_ARTICULAR

## 2019-09-27 MED ORDER — LIDOCAINE HCL 2 % IJ SOLN
20.0000 mL | Freq: Once | INTRAMUSCULAR | Status: AC
  Administered 2019-09-27: 400 mg

## 2019-09-27 MED ORDER — ROPIVACAINE HCL 2 MG/ML IJ SOLN
9.0000 mL | Freq: Once | INTRAMUSCULAR | Status: AC
  Administered 2019-09-27: 9 mL via INTRA_ARTICULAR

## 2019-09-27 MED ORDER — METHYLPREDNISOLONE ACETATE 80 MG/ML IJ SUSP
INTRAMUSCULAR | Status: AC
  Filled 2019-09-27: qty 1

## 2019-09-27 NOTE — Patient Instructions (Signed)

## 2019-09-27 NOTE — Progress Notes (Signed)
PROVIDER NOTE: Information contained herein reflects review and annotations entered in association with encounter. Interpretation of such information and data should be left to medically-trained personnel. Information provided to patient can be located elsewhere in the medical record under "Patient Instructions". Document created using STT-dictation technology, any transcriptional errors that may result from process are unintentional.    Patient: Kristin [name]  Service Category: Procedure  Provider: Gaspar Cola, MD  DOB: 07/26/1930  DOS: 09/27/2019  Location: [facility]  MRN: [id number]  Setting: Ambulatory - outpatient  Referring Provider: [name] *  Type: Established Patient  Specialty: Interventional Pain Management  PCP: Olin Hauser, DO   Primary Reason for Visit: Interventional Pain Management Treatment. CC: Hip Pain (left)  Procedure:          Anesthesia, Analgesia, Anxiolysis:  Type: Diagnostic Sacroiliac Joint Steroid Injection #1  Region: Superior Lumbosacral Region Level: PSIS (Posterior Superior Iliac Spine) Laterality: Left-Side  Type: Local Anesthesia Indication(s): Analgesia         Route: Infiltration ([place]/IM) IV Access: Declined Sedation: Declined  Local Anesthetic: Lidocaine 1-2%  Position: Prone           Indications: 1. Chronic sacroiliac joint pain (Left)   2. Other specified dorsopathies, sacral and sacrococcygeal region   3. Chronic hip pain (Left)    Pain Score: Pre-procedure: 6 /10 Post-procedure: 0-No pain/10   Note: The patient came into the clinic today indicating that she was having pain in the left hip however, upon further examination and history taking, it turns out that the patient's pain is in the area of the left PSIS and sacroiliac joint.  The patient was informed that this area is not really the hip joint but the sacroiliac joint.  This is a common misunderstanding on the part of the patient's, but I  believe that we have corrected this today.  Palpation over that area did reproduce the patient's symptoms while palpation over the true hip joint area did not.  The patient also had a positive Patrick maneuver for left sacroiliac joint pain.  Pre-op Assessment:  Kristin [name] is a 86 y.o. (year old), female patient, seen today for interventional treatment. She  has a past surgical history that includes Abdominal hysterectomy; Back surgery; Rotator cuff repair; Breast surgery; Cataract extraction w/PHACO (10/19/2011); Cataract extraction w/PHACO (11/02/2011); Carpal tunnel release; Joint replacement; Colon surgery; Pacemaker insertion (N/A, 12/24/2015); Cholecystectomy; Breast lumpectomy (Right, 2004); and Insert / replace / remove pacemaker. Kristin [name] has a current medication list which includes the following prescription(s): acetaminophen, albuterol, apixaban, bisacodyl, calcium carbonate-vitamin d, vitamin d, furosemide, levocetirizine, lisinopril, melatonin, NONFORMULARY OR COMPOUNDED ITEM, [START ON 09/30/2019] oxycodone hcl, [START ON 10/30/2019] oxycodone hcl, [START ON 11/29/2019] oxycodone hcl, oxygen-helium, polyethylene glycol, ropinirole, and benefiber. Her primarily concern today is the Hip Pain (left)  Initial Vital Signs:  Pulse/HCG Rate: 66  Temp: (!) 97.1 F (36.2 C) Resp: 18 BP: 127/78 SpO2: 98 %  BMI: Estimated body mass index is 25.53 kg/m as calculated from the following:   Height as of this encounter: 5\' 7"  (1.702 m).   Weight as of this encounter: 163 lb (73.9 kg).  Risk Assessment: Allergies: Reviewed. She is allergic to flexeril [cyclobenzaprine hcl] and vancomycin.  Allergy Precautions: None required Coagulopathies: Reviewed. None identified.  Blood-thinner therapy: None at this time Active Infection(s): Reviewed. None identified. Kristin [name] is afebrile  Site Confirmation: Kristin [name] was asked to confirm the procedure and laterality before marking the site Procedure checklist:  Completed Consent: Before the procedure and under the influence of no sedative(s), amnesic(s), or anxiolytics, the patient was informed of the treatment options, risks and possible complications. To fulfill our ethical and legal obligations, as recommended by the American Medical Association's Code of Ethics, I have informed the patient of my clinical impression; the nature and purpose of the treatment or procedure; the risks, benefits, and possible complications of the intervention; the alternatives, including doing nothing; the risk(s) and benefit(s) of the alternative treatment(s) or procedure(s); and the risk(s) and benefit(s) of doing nothing. The patient was provided information about the general risks and possible complications associated with the procedure. These may include, but are not limited to: failure to achieve desired goals, infection, bleeding, organ or nerve damage, allergic reactions, paralysis, and death. In addition, the patient was informed of those risks and complications associated to the procedure, such as failure to decrease pain; infection; bleeding; organ or nerve damage with subsequent damage to sensory, motor, and/or autonomic systems, resulting in permanent pain, numbness, and/or weakness of one or several areas of the body; allergic reactions; (i.e.: anaphylactic reaction); and/or death. Furthermore, the patient was informed of those risks and complications associated with the medications. These include, but are not limited to: allergic reactions (i.e.: anaphylactic or anaphylactoid reaction(s)); adrenal axis suppression; blood sugar elevation that in diabetics may result in ketoacidosis or comma; water retention that in patients with history of congestive heart failure may result in shortness of breath, pulmonary edema, and decompensation with resultant heart failure; weight gain; swelling or edema; medication-induced neural toxicity; particulate matter embolism and blood vessel  occlusion with resultant organ, and/or nervous system infarction; and/or aseptic necrosis of one or more joints. Finally, the patient was informed that Medicine is not an exact science; therefore, there is also the possibility of unforeseen or unpredictable risks and/or possible complications that may result in a catastrophic outcome. The patient indicated having understood very clearly. We have given the patient no guarantees and we have made no promises. Enough time was given to the patient to ask questions, all of which were answered to the patient's satisfaction. Ms. Stemler has indicated that she wanted to continue with the procedure. Attestation: I, the ordering provider, attest that I have discussed with the patient the benefits, risks, side-effects, alternatives, likelihood of achieving goals, and potential problems during recovery for the procedure that I have provided informed consent. Date  Time: 09/27/2019 10:13 AM  Pre-Procedure Preparation:  Monitoring: As per clinic protocol. Respiration, ETCO2, SpO2, BP, heart rate and rhythm monitor placed and checked for adequate function Safety Precautions: Patient was assessed for positional comfort and pressure points before starting the procedure. Time-out: I initiated and conducted the "Time-out" before starting the procedure, as per protocol. The patient was asked to participate by confirming the accuracy of the "Time Out" information. Verification of the correct person, site, and procedure were performed and confirmed by me, the nursing staff, and the patient. "Time-out" conducted as per Joint Commission's Universal Protocol (UP.01.01.01). Time: 1047  Description of Procedure:          Target Area: Superior, posterior, aspect of the sacroiliac fissure Approach: Posterior, paraspinal, ipsilateral approach. Area Prepped: Entire Lower Lumbosacral Region Prepping solution: DuraPrep (Iodine Povacrylex [0.7% available iodine] and Isopropyl Alcohol,  74% w/w) Safety Precautions: Aspiration looking for blood return was conducted prior to all injections. At no point did we inject any substances, as a needle was being advanced. No attempts were made at seeking any paresthesias. Safe injection  practices and needle disposal techniques used. Medications properly checked for expiration dates. SDV (single dose vial) medications used. Description of the Procedure: Protocol guidelines were followed. The patient was placed in position over the procedure table. The target area was identified and the area prepped in the usual manner. Skin & deeper tissues infiltrated with local anesthetic. Appropriate amount of time allowed to pass for local anesthetics to take effect. The procedure needle was advanced under fluoroscopic guidance into the sacroiliac joint until a firm endpoint was obtained. Proper needle placement secured. Negative aspiration confirmed. Solution injected in intermittent fashion, asking for systemic symptoms every 0.5cc of injectate. The needles were then removed and the area cleansed, making sure to leave some of the prepping solution back to take advantage of its long term bactericidal properties. Vitals:   09/27/19 1013 09/27/19 1035 09/27/19 1045 09/27/19 1052  BP: 127/78 (!) 156/101 (!) 159/84 (!) 161/88  Pulse: 66 83 60 62  Resp: 18 15 16 17   Temp: (!) 97.1 F (36.2 C)     SpO2: 98% 100% 100% 99%  Weight: 163 lb (73.9 kg)     Height: 5\' 7"  (1.702 m)       Start Time: 1047 hrs. End Time: 1048 hrs. Materials:  Needle(s) Type: Spinal Needle Gauge: 22G Length: 3.5-in Medication(s): Please see orders for medications and dosing details.  Imaging Guidance (Non-Spinal):          Type of Imaging Technique: Fluoroscopy Guidance (Non-Spinal) Indication(s): Assistance in needle guidance and placement for procedures requiring needle placement in or near specific anatomical locations not easily accessible without such assistance. Exposure  Time: Please see nurses notes. Contrast: Before injecting any contrast, we confirmed that the patient did not have an allergy to iodine, shellfish, or radiological contrast. Once satisfactory needle placement was completed at the desired level, radiological contrast was injected. Contrast injected under live fluoroscopy. No contrast complications. See chart for type and volume of contrast used. Fluoroscopic Guidance: I was personally present during the use of fluoroscopy. "Tunnel Vision Technique" used to obtain the best possible view of the target area. Parallax error corrected before commencing the procedure. "Direction-depth-direction" technique used to introduce the needle under continuous pulsed fluoroscopy. Once target was reached, antero-posterior, oblique, and lateral fluoroscopic projection used confirm needle placement in all planes. Images permanently stored in EMR. Interpretation: I personally interpreted the imaging intraoperatively. Adequate needle placement confirmed in multiple planes. Appropriate spread of contrast into desired area was observed. No evidence of afferent or efferent intravascular uptake. Permanent images saved into the patient's record.  Antibiotic Prophylaxis:   Anti-infectives (From admission, onward)   None     Indication(s): None identified  Post-operative Assessment:  Post-procedure Vital Signs:  Pulse/HCG Rate: 62  Temp: (!) 97.1 F (36.2 C) Resp: 17 BP: (!) 161/88 SpO2: 99 %  EBL: None  Complications: No immediate post-treatment complications observed by team, or reported by patient.  Note: The patient tolerated the entire procedure well. A repeat set of vitals were taken after the procedure and the patient was kept under observation following institutional policy, for this type of procedure. Post-procedural neurological assessment was performed, showing return to baseline, prior to discharge. The patient was provided with post-procedure discharge  instructions, including a section on how to identify potential problems. Should any problems arise concerning this procedure, the patient was given instructions to immediately contact us, at any time, without hesitation. In any case, we plan to contact the patient by telephone for a follow-up status report  regarding this interventional procedure.  Comments:  No additional relevant information.  Plan of Care  Orders:  Orders Placed This Encounter  Procedures  . SACROILIAC JOINT INJECTION    Scheduling Instructions:     Side: Left-sided     Sedation: No Sedation.     Timeframe: Today    Order Specific Question:   Where will this procedure be performed?    Answer:   [facility]  . DG PAIN CLINIC C-ARM 1-60 MIN NO REPORT    Intraoperative interpretation by procedural physician at [place].    Standing Status:   Standing    Number of Occurrences:   1    Order Specific Question:   Reason for exam:    Answer:   Assistance in needle guidance and placement for procedures requiring needle placement in or near specific anatomical locations not easily accessible without such assistance.  . Care order/instruction: Please confirm that the patient has stopped the Eliquis (Apixaban) x 3 days prior to procedure or surgery.    Please confirm that the patient has stopped the Eliquis (Apixaban) x 3 days prior to procedure or surgery.    Standing Status:   Standing    Number of Occurrences:   1  . Provide equipment / supplies at bedside    Equipment required: Single use, disposable, "Block Tray"    Standing Status:   Standing    Number of Occurrences:   1    Order Specific Question:   Specify    Answer:   Block Tray  . Informed Consent Details: Physician/Practitioner Attestation; Transcribe to consent form and obtain patient signature    Nursing Order: Transcribe to consent form and obtain patient signature. Note: Always confirm laterality of pain with Ms. Philbrick, before  procedure. Procedure: Left SI joint injection Indication/Reason: Left sacroiliac joint Pain (Arthralgia) Provider Attestation: I, [place] Dossie Arbour, MD, (Pain Management Specialist), the physician/practitioner, attest that I have discussed with the patient the benefits, risks, side effects, alternatives, likelihood of achieving goals and potential problems during recovery for the procedure that I have provided informed consent.  . Informed Consent Details: Physician/Practitioner Attestation; Transcribe to consent form and obtain patient signature    Provider Attestation: I, [place] Dossie Arbour, MD, (Pain Management Specialist), the physician/practitioner, attest that I have discussed with the patient the benefits, risks, side effects, alternatives, likelihood of achieving goals and potential problems during recovery for the procedure that I have provided informed consent.    Scheduling Instructions:     Procedure: Sacroiliac Joint Block     Indication/Reason: Chronic Low Back and Hip Pain secondary to Sacroiliac Joint Pain (Arthralgia/Arthropathy)     Nursing Order: Transcribe to consent form and obtain patient signature.     Note: Always confirm laterality of pain with Ms. Gowens, before procedure.   Chronic Opioid Analgesic:  Oxycodone IR 20 mg, 1 tab PO q 8 hrs (60 mg/day of oxycodone) MME/day: 90 mg/day.   Medications ordered for procedure: Meds ordered this encounter  Medications  . lidocaine (XYLOCAINE) 2 % (with pres) injection 400 mg  . ropivacaine (PF) 2 mg/mL (0.2%) (NAROPIN) injection 9 mL  . methylPREDNISolone acetate (DEPO-MEDROL) injection 80 mg   Medications administered: We administered lidocaine, ropivacaine (PF) 2 mg/mL (0.2%), and methylPREDNISolone acetate.  See the medical record for exact dosing, route, and time of administration.  Follow-up plan:   Return in about 2 weeks (around 10/11/2019) for (VV), (PP).       Interventional management options:  Considering:    NOTE: ELIQUIS ANTICOAGULATION (Stop: x3 days prior to procedure  Restart 6 hours after procedures) Diagnostic right SI joint block Possible bilateral SI joint RFA Diagnostic bilateral femoral nerve + obturator NB Possible bilateral femoral nerve + obturator nerve RFA Diagnostic right IA shoulder joint injection   Palliative PRN treatment(s):   Diagnostic left SI joint block #2  Palliative left CESI #3  Palliative left trochanteric bursa injection #2  Palliative right IA hip inj. #2  (100% on the right) Palliative left IA hip inj. #3   Palliative right lumbar facet block #3 Palliative left lumbar facet block #5 Palliative right lumbar facet RFA #2 (Last done on 09/19/18) Palliative left lumbar facet RFA #4 (Last done on 08/01/18) Palliative bilateral suprascapular nerve block Palliative bilateral suprascapular nerve RFA #2(Last done 07/13/18)    Recent Visits Date Type Provider Dept  09/26/19 Telemedicine Milinda Pointer, [facility]  07/02/19 Telemedicine Milinda Pointer, MD [facility]  Showing recent visits within past 90 days and meeting all other requirements   Today's Visits Date Type Provider Dept  09/27/19 Procedure visit Milinda Pointer, MD [facility]  Showing today's visits and meeting all other requirements   Future Appointments Date Type Provider Dept  10/10/19 Appointment Milinda Pointer, MD [facility]  12/26/19 Appointment Milinda Pointer, MD [facility]  Showing future appointments within next 90 days and meeting all other requirements   Disposition: Discharge home  Discharge (Date  Time): 09/27/2019; 1057 hrs.   Primary Care Physician: Olin Hauser, DO Location: [facility] Note by: Gaspar Cola, MD Date: 09/27/2019; Time: 11:00 AM  Disclaimer:  Medicine is not an Chief Strategy Officer. The only guarantee in medicine is that nothing is  guaranteed. It is important to note that the decision to proceed with this intervention was based on the information collected from the patient. The Data and conclusions were drawn from the patient's questionnaire, the interview, and the physical examination. Because the information was provided in large part by the patient, it cannot be guaranteed that it has not been purposely or unconsciously manipulated. Every effort has been made to obtain as much relevant data as possible for this evaluation. It is important to note that the conclusions that lead to this procedure are derived in large part from the available data. Always take into account that the treatment will also be dependent on availability of resources and existing treatment guidelines, considered by other Pain Management Practitioners as being common knowledge and practice, at the time of the intervention. For Medico-Legal purposes, it is also important to point out that variation in procedural techniques and pharmacological choices are the acceptable norm. The indications, contraindications, technique, and results of the above procedure should only be interpreted and judged by a Board-Certified Interventional Pain Specialist with extensive familiarity and expertise in the same exact procedure and technique.

## 2019-09-27 NOTE — Progress Notes (Signed)
Safety precautions to be maintained throughout the outpatient stay will include: orient to surroundings, keep bed in low position, maintain call bell within reach at all times, provide assistance with transfer out of bed and ambulation.  

## 2019-09-28 ENCOUNTER — Telehealth: Payer: Self-pay | Admitting: *Deleted

## 2019-09-28 NOTE — Telephone Encounter (Signed)
No problems post procedure. 

## 2019-10-01 ENCOUNTER — Ambulatory Visit (INDEPENDENT_AMBULATORY_CARE_PROVIDER_SITE_OTHER): Payer: Medicare Other | Admitting: Pharmacist

## 2019-10-01 DIAGNOSIS — I10 Essential (primary) hypertension: Secondary | ICD-10-CM | POA: Diagnosis not present

## 2019-10-01 NOTE — Chronic Care Management (AMB) (Signed)
Chronic Care Management   Follow Up Note   10/01/2019 Name: Kristin [name] MRN: [id number] DOB: 1930/03/24  Referred by: Olin Hauser, DO Reason for referral : Chronic Care Management (Patient Phone Call)   Kristin [name] is a 86 y.o. year old female who is a primary care patient of Olin Hauser, DO. The CCM team was consulted for assistance with chronic disease management and care coordination needs.  Kristin [name] has a past medical history including but not limited to hypertension, CKD, Chronic heart failure with preserved ejection fraction, COPD, atrial fibrillation, seasonal allergies, chronic pain, restless leg syndrome and insomnia.     I reached out to Kristin [name] by phone today.   Review of patient status, including review of consultants reports, relevant laboratory and other test results, and collaboration with appropriate care team members and the patient's provider was performed as part of comprehensive patient evaluation and provision of chronic care management services.     Outpatient Encounter Medications as of 10/01/2019  Medication Sig Note  . acetaminophen (TYLENOL) 500 MG tablet Take 500 mg by mouth 3 (three) times daily.   Marland Kitchen albuterol (PROVENTIL) (2.5 MG/3ML) 0.083% nebulizer solution Take 3 mLs (2.5 mg total) by nebulization every 8 (eight) hours as needed for wheezing or shortness of breath.   Marland Kitchen apixaban (ELIQUIS) 5 MG TABS tablet Take 1 tablet (5 mg total) by mouth 2 (two) times daily.   . bisacodyl (DULCOLAX) 5 MG EC tablet Take 2 tablets (10 mg total) by mouth daily as needed for moderate constipation.   . Calcium Carbonate-Vitamin D 600-200 MG-UNIT TABS Take by mouth.   . Cholecalciferol (VITAMIN D) 2000 units tablet Take 2,000 Units by mouth daily.    . furosemide (LASIX) 20 MG tablet TAKE (1/2) TABLET BY MOUTH ONCE DAILY.   Marland Kitchen levocetirizine (XYZAL) 5 MG tablet Take 0.5 tablets (2.5 mg total) by mouth every other day.   . lisinopril (ZESTRIL) 10  MG tablet Take 1 tablet (10 mg total) by mouth daily.   . Melatonin 10 MG CAPS Take 10 mg by mouth at bedtime as needed.   . NONFORMULARY OR COMPOUNDED ITEM Sig: Apply 1-2 gm(s) (2-4 pumps) to affected area, 3-4 times/day. (1 pump = 0.5 gm)   . Oxycodone HCl 20 MG TABS Take 1 tablet (20 mg total) by mouth every 8 (eight) hours as needed. Must last 30 days   . [START ON 10/30/2019] Oxycodone HCl 20 MG TABS Take 1 tablet (20 mg total) by mouth every 8 (eight) hours as needed. Must last 30 days   . [START ON 11/29/2019] Oxycodone HCl 20 MG TABS Take 1 tablet (20 mg total) by mouth every 8 (eight) hours as needed. Must last 30 days   . OXYGEN Inhale 2 L into the lungs at bedtime.  04/25/2018: At night as needed  . polyethylene glycol (MIRALAX / GLYCOLAX) 17 g packet Take 17 g by mouth daily as needed for moderate constipation. Must last 30 days   . rOPINIRole (REQUIP) 2 MG tablet Take 2 tablets (4 mg total) by mouth at bedtime. Must last 30 days   . Wheat Dextrin (BENEFIBER) POWD Take 6 g by mouth 3 (three) times daily before meals. (2 tsp = 6 g)    No facility-administered encounter medications on file as of 10/01/2019.    Goals Addressed            This Visit's Progress   . PharmD - [place]  Current Barriers:  . Financial Barriers - currently in coverage gap of Part D coverage  Pharmacist Clinical Goal(s):  Marland Kitchen Over the next 30 days, patient will work with CM Pharmacist to address needs related to medication assistance  Interventions: . Follow up with patient regarding importance of medication adherence o [name] reports that she obtained and started using a weekly pillbox. States that this has been particularly useful with helping her to keep up with the every other day dosing of her levocetirizine . Again counsel patient regarding benefit of keeping record of blood pressure readings o Reports checking blood pressure at home occasionally (has upper arm monitor) and that  readings have been "good", but denies keeping record o Encourage patient to continue checking BP, keep log and bring record with her to medical appointments . Will mail patient a blood pressure log and BP monitoring education as requested . Coordiation of care - confirm appointment with PCP tomorrow morning.  Patient Self Care Activities:  . Attends all scheduled provider appointments . Calls pharmacy for medication refills . Calls provider office for new concerns or questions  Please see past updates related to this goal by clicking on the "Past Updates" button in the selected goal         Plan  The care management team will reach out to the patient again over the next 30 days.   Kristin [name], PharmD, [place] [employer] [phone]

## 2019-10-01 NOTE — Patient Instructions (Signed)
Thank you allowing the Chronic Care Management Team to be a part of your care! It was a pleasure speaking with you today!     CCM (Chronic Care Management) Team    Noreene Larsson RN, MSN, CCM Nurse Care Coordinator  [phone]   Harlow Asa PharmD  Clinical Pharmacist  [phone]   Eula Fried LCSW Clinical Social Worker [phone]  Visit Information  Goals Addressed            This Visit's Progress   . PharmD - [place]       Current Barriers:  . Financial Barriers - currently in coverage gap of Part D coverage  Pharmacist Clinical Goal(s):  Marland Kitchen Over the next 30 days, patient will work with CM Pharmacist to address needs related to medication assistance  Interventions: . Follow up with patient regarding importance of medication adherence o Ms. Saintjean reports that she obtained and started using a weekly pillbox. States that this has been particularly useful with helping her to keep up with the every other day dosing of her levocetirizine . Again counsel patient regarding benefit of keeping record of blood pressure readings o Reports checking blood pressure at home occasionally (has upper arm monitor) and that readings have been "good", but denies keeping record o Encourage patient to continue checking BP, keep log and bring record with her to medical appointments . Will mail patient a blood pressure log and BP monitoring education as requested . Coordiation of care - confirm appointment with PCP tomorrow morning.  Patient Self Care Activities:  . Attends all scheduled provider appointments . Calls pharmacy for medication refills . Calls provider office for new concerns or questions  Please see past updates related to this goal by clicking on the "Past Updates" button in the selected goal         The patient verbalized understanding of instructions provided today and declined a print copy of patient instruction materials.   The care  management team will reach out to the patient again over the next 30 days.   Harlow Asa, PharmD, [place] [employer] [phone]

## 2019-10-02 ENCOUNTER — Other Ambulatory Visit: Payer: Self-pay | Admitting: Family Medicine

## 2019-10-02 ENCOUNTER — Encounter: Payer: Self-pay | Admitting: Family Medicine

## 2019-10-02 ENCOUNTER — Other Ambulatory Visit: Payer: Self-pay

## 2019-10-02 ENCOUNTER — Ambulatory Visit: Payer: Medicare Other | Admitting: Family Medicine

## 2019-10-02 ENCOUNTER — Ambulatory Visit (INDEPENDENT_AMBULATORY_CARE_PROVIDER_SITE_OTHER): Payer: Medicare Other | Admitting: Family Medicine

## 2019-10-02 VITALS — BP 122/63 | HR 63 | Temp 96.8°F | Resp 16 | Ht 68.0 in | Wt 161.0 lb

## 2019-10-02 DIAGNOSIS — I4891 Unspecified atrial fibrillation: Secondary | ICD-10-CM | POA: Diagnosis not present

## 2019-10-02 DIAGNOSIS — M160 Bilateral primary osteoarthritis of hip: Secondary | ICD-10-CM

## 2019-10-02 DIAGNOSIS — I4892 Unspecified atrial flutter: Secondary | ICD-10-CM

## 2019-10-02 DIAGNOSIS — I5032 Chronic diastolic (congestive) heart failure: Secondary | ICD-10-CM | POA: Diagnosis not present

## 2019-10-02 DIAGNOSIS — I129 Hypertensive chronic kidney disease with stage 1 through stage 4 chronic kidney disease, or unspecified chronic kidney disease: Secondary | ICD-10-CM | POA: Diagnosis not present

## 2019-10-02 DIAGNOSIS — G894 Chronic pain syndrome: Secondary | ICD-10-CM

## 2019-10-02 DIAGNOSIS — N1831 Chronic kidney disease, stage 3a: Secondary | ICD-10-CM

## 2019-10-02 DIAGNOSIS — J432 Centrilobular emphysema: Secondary | ICD-10-CM | POA: Diagnosis not present

## 2019-10-02 DIAGNOSIS — M5137 Other intervertebral disc degeneration, lumbosacral region: Secondary | ICD-10-CM

## 2019-10-02 DIAGNOSIS — I495 Sick sinus syndrome: Secondary | ICD-10-CM | POA: Diagnosis not present

## 2019-10-02 DIAGNOSIS — Z0000 Encounter for general adult medical examination without abnormal findings: Secondary | ICD-10-CM

## 2019-10-02 DIAGNOSIS — E559 Vitamin D deficiency, unspecified: Secondary | ICD-10-CM

## 2019-10-02 DIAGNOSIS — D649 Anemia, unspecified: Secondary | ICD-10-CM

## 2019-10-02 DIAGNOSIS — E785 Hyperlipidemia, unspecified: Secondary | ICD-10-CM

## 2019-10-02 DIAGNOSIS — N183 Chronic kidney disease, stage 3 unspecified: Secondary | ICD-10-CM

## 2019-10-02 DIAGNOSIS — R7309 Other abnormal glucose: Secondary | ICD-10-CM

## 2019-10-02 MED ORDER — ALBUTEROL SULFATE (2.5 MG/3ML) 0.083% IN NEBU: 2.5000 mg | INHALATION_SOLUTION | Freq: Three times a day (TID) | RESPIRATORY_TRACT | 3 refills | Status: DC | PRN

## 2019-10-02 NOTE — Assessment & Plan Note (Signed)
Stable without acute exacerbation dCHF with preserved EF Followed by Dr Saralyn Pilar [facility] Cardiology On low dose furosemide diuretic

## 2019-10-02 NOTE — Patient Instructions (Addendum)
Thank you for coming to the office today.  Keep up the good work overall.  Refilled Albuterol  Call if need more med refills  DUE for FASTING BLOOD WORK (no food or drink after midnight before the lab appointment, only water or coffee without cream/sugar on the morning of)  SCHEDULE "Lab Only" visit in the morning at the clinic for lab draw in 6 MONTHS   - Make sure Lab Only appointment is at about 1 week before your next appointment, so that results will be available  For Lab Results, once available within 2-3 days of blood draw, you can can log in to MyChart online to view your results and a brief explanation. Also, we can discuss results at next follow-up visit.   Please schedule a Follow-up Appointment to: Return in about 6 months (around 03/31/2020) for Annual Physical.  If you have any other questions or concerns, please feel free to call the office or send a message through [place]. You may also schedule an earlier appointment if necessary.  Additionally, you may be receiving a survey about your experience at our office within a few days to 1 week by e-mail or mail. We value your feedback.  [name], DO [place]

## 2019-10-02 NOTE — Assessment & Plan Note (Addendum)
Well-controlled HTN - Home BP readings reviewed  Complication with CKD III, dCHF    Plan:  1. Continue current BP regimen Lisinopril 10mg  daily, Furosemide 10mg  daily (half of 20mg ) 2. Encourage improved lifestyle - low sodium diet, regular exercise 3. Continue monitor BP outside office, bring readings to next visit, if persistently >140/90 or new symptoms notify office sooner

## 2019-10-02 NOTE — Assessment & Plan Note (Signed)
Stable without acute COPD exacerbation Former smoker >30 years ago Not on maintenance therapy Has nebulizer at home w/ albuterol, refilled

## 2019-10-02 NOTE — Progress Notes (Signed)
Subjective:    [name], female    DOB: [birth year], 86 y.o.   MRN: [id number]  Kristin [name] is a 86 y.o. female presenting on 10/02/2019 for Hypertension (follow up with Dr.K after Ander Purpura NP)  Previous PCP [name], AGPCNP-BC. Here to re-establish with new provider at [facility]  HPI   CHRONIC HTN CKD III Reports checks her BP regularly at home on cuff, usually average is normal, occasionally has a mild high or low Current Meds - Lisinopril 10mg  daily, Furosemide 10mg  (Half of 20mg ) daily   Reports good compliance, took meds today. Tolerating well, w/o complaints. Lifestyle: - Diet: limited salt diet - Exercise: limited due to arthritis Admits occasional edema, mostly controlled Denies CP, dyspnea, HA, dizziness / lightheadedness  Chronic Pain Syndrome / Osteoarthritis multiple joints / Sacroiliac Pain / Lumbar Facet Syndrome / L Hip Pain Followed by [facility] Pain Management Dr Dossie Arbour, recently had injection therapy, some improvement She is on Oxycodone 20mg  q 8 hr PRN with some good results, helps her function She uses cane to assist with ambulation.  Cardiology / Diastolic CHF Chronic / Atrial Flutter  With Atrial Fibrillation Followed by Dr Saralyn Pilar [facility] Cardiology - chart review. Prior history reduced LVEF has improved, and has mild valvular insufficiency. History of Atrial Fib and Flutter in past minimally symptomatic and on Eliquis anticoagulation for primary prevention CVA. 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  Centrilobular Emphysema (COPD) / Former Smoker quit >30 years ago Not endorsing significant problem with emphysema. Currently breathing well, since former smoker. Not using inhaler. She has a nebulizer machine at home for PRN use albuterol only. Rarely uses.  Health Maintenance: UTD Flu vaccine  Depression screen [facility] 2/9 10/02/2019 09/27/2019 05/08/2019  Decreased Interest 0 0 0  Down, Depressed, Hopeless 0 0 0  PHQ - 2 Score 0 0 0  Altered  sleeping - - 0  Tired, decreased energy - - 0  Change in appetite - - 0  Feeling bad or failure about yourself  - - 0  Trouble concentrating - - 0  Moving slowly or fidgety/restless - - 0  Suicidal thoughts - - 0  PHQ-9 Score - - 0  Difficult doing work/chores - - Not difficult at all  Some recent data might be hidden    Past Medical History:  Diagnosis Date  . Acute postoperative pain 02/09/2018  . Arrhythmia, sinus node 04/03/2014  . Arthritis   . Arthritis   . Atrial fibrillation ([place])   . Back pain    lower back chronic  . Bradycardia   . Breast cancer ([place]) 2004   right breast cancer  . Cancer [facility]) 2004   rt breast cancer-post lumpectomy- chemo/rad  . CHF (congestive heart failure) ([place])   . Chronic back pain   . CKD (chronic kidney disease)   . COPD (chronic obstructive pulmonary disease) (HCC)    wears O2 at 2L via [place] at night  . Cystocele   . Decubitus ulcers   . Dehydration   . Gout   . Hematuria   . Hepatitis C   . Hyperlipidemia   . Hypertension   . Hypomagnesemia 06/25/2015  . OAB (overactive bladder)   . Overactive bladder   . Restless leg   . Shoulder pain, bilateral   . Urinary frequency   . UTI (lower urinary tract infection)   . Vaginal atrophy    Past Surgical History:  Procedure Laterality Date  . ABDOMINAL  HYSTERECTOMY    . BACK SURGERY    . BREAST LUMPECTOMY Right 2004  . BREAST SURGERY     rt lumpectomy  . CARPAL TUNNEL RELEASE    . CATARACT EXTRACTION W/PHACO  10/19/2011   Procedure: CATARACT EXTRACTION PHACO AND INTRAOCULAR LENS PLACEMENT (IOC);  Surgeon: Elta Guadeloupe T. Gershon Crane, MD;  Location: [facility];  Service: Ophthalmology;  Laterality: Right;  CDE:10.81  . CATARACT EXTRACTION W/PHACO  11/02/2011   Procedure: CATARACT EXTRACTION PHACO AND INTRAOCULAR LENS PLACEMENT (IOC);  Surgeon: Elta Guadeloupe T. Gershon Crane, MD;  Location: [facility];  Service: Ophthalmology;  Laterality: Left;  CDE 8.60  . CHOLECYSTECTOMY     3/18  . COLON SURGERY    . INSERT / REPLACE  / REMOVE PACEMAKER    . JOINT REPLACEMENT     bilateral TKA  . PACEMAKER INSERTION N/A 12/24/2015   Procedure: INSERTION PACEMAKER;  Surgeon: Isaias Cowman, MD;  Location: [facility] ORS;  Service: Cardiovascular;  Laterality: N/A;  . ROTATOR CUFF REPAIR     bilateral   Social History   Socioeconomic History  . Marital status: Single    Spouse name: Not on file  . Number of children: 2  . Years of education: 63  . Highest education level: 12th grade  Occupational History  . Occupation: retired  Tobacco Use  . Smoking status: Former Smoker    Packs/day: 1.25    Years: 40.00    Pack years: 50.00    Types: Cigarettes    Quit date: 12/16/1998    Years since quitting: 20.8  . Smokeless tobacco: Former Network engineer and Sexual Activity  . Alcohol use: No    Alcohol/week: 0.0 standard drinks  . Drug use: No  . Sexual activity: Not Currently    Birth control/protection: Surgical  Other Topics Concern  . Not on file  Social History Narrative  . Not on file   Social Determinants of Health   Financial Resource Strain: Medium Risk  . Difficulty of Paying Living Expenses: Somewhat hard  Food Insecurity:   . Worried About Charity fundraiser in the Last Year: Not on file  . Ran Out of Food in the Last Year: Not on file  Transportation Needs:   . Lack of Transportation (Medical): Not on file  . Lack of Transportation (Non-Medical): Not on file  Physical Activity: Inactive  . Days of Exercise per Week: 0 days  . Minutes of Exercise per Session: 0 min  Stress:   . Feeling of Stress : Not on file  Social Connections:   . Frequency of Communication with Friends and Family: Not on file  . Frequency of Social Gatherings with Friends and Family: Not on file  . Attends Religious Services: Not on file  . Active Member of Clubs or Organizations: Not on file  . Attends Archivist Meetings: Not on file  . Marital Status: Not on file  Intimate Partner Violence:   . Fear of  Current or Ex-Partner: Not on file  . Emotionally Abused: Not on file  . Physically Abused: Not on file  . Sexually Abused: Not on file   Family History  Problem Relation Age of Onset  . Kidney disease Brother   . Heart disease Father   . Heart disease Mother   . Stroke Mother   . Cancer Sister   . Breast cancer Sister 57  . Breast cancer Paternal Aunt   . Anesthesia problems Neg Hx   . Hypotension Neg Hx   .  Malignant hyperthermia Neg Hx   . Pseudochol deficiency Neg Hx    Current Outpatient Medications on File Prior to Visit  Medication Sig  . acetaminophen (TYLENOL) 500 MG tablet Take 500 mg by mouth 3 (three) times daily.  Marland Kitchen apixaban (ELIQUIS) 5 MG TABS tablet Take 1 tablet (5 mg total) by mouth 2 (two) times daily.  . bisacodyl (DULCOLAX) 5 MG EC tablet Take 2 tablets (10 mg total) by mouth daily as needed for moderate constipation.  . Cholecalciferol (VITAMIN D) 2000 units tablet Take 2,000 Units by mouth daily.   . furosemide (LASIX) 20 MG tablet TAKE (1/2) TABLET BY MOUTH ONCE DAILY.  Marland Kitchen levocetirizine (XYZAL) 5 MG tablet Take 0.5 tablets (2.5 mg total) by mouth every other day.  . lisinopril (ZESTRIL) 10 MG tablet Take 1 tablet (10 mg total) by mouth daily.  . Melatonin 10 MG CAPS Take 10 mg by mouth at bedtime as needed.  . OXYGEN Inhale 2 L into the lungs at bedtime.   . polyethylene glycol (MIRALAX / GLYCOLAX) 17 g packet Take 17 g by mouth daily as needed for moderate constipation. Must last 30 days  . rOPINIRole (REQUIP) 2 MG tablet Take 2 tablets (4 mg total) by mouth at bedtime. Must last 30 days  . Wheat Dextrin (BENEFIBER) POWD Take 6 g by mouth 3 (three) times daily before meals. (2 tsp = 6 g)  . Calcium Carbonate-Vitamin D 600-200 MG-UNIT TABS Take by mouth.  . NONFORMULARY OR COMPOUNDED ITEM Sig: Apply 1-2 gm(s) (2-4 pumps) to affected area, 3-4 times/day. (1 pump = 0.5 gm)  . Oxycodone HCl 20 MG TABS Take 1 tablet (20 mg total) by mouth every 8 (eight) hours as  needed. Must last 30 days (Patient not taking: Reported on 10/02/2019)  . [START ON 10/30/2019] Oxycodone HCl 20 MG TABS Take 1 tablet (20 mg total) by mouth every 8 (eight) hours as needed. Must last 30 days (Patient not taking: Reported on 10/02/2019)  . [START ON 11/29/2019] Oxycodone HCl 20 MG TABS Take 1 tablet (20 mg total) by mouth every 8 (eight) hours as needed. Must last 30 days   No current facility-administered medications on file prior to visit.    Review of Systems Per HPI unless specifically indicated above      Objective:    BP 122/63   Pulse 63   Temp (!) 96.8 F (36 C) (Oral)   Resp 16   Ht 5\' 8"  (1.727 m)   Wt 161 lb (73 kg)   SpO2 100%   BMI 24.48 kg/m   Wt Readings from Last 3 Encounters:  10/02/19 161 lb (73 kg)  09/27/19 163 lb (73.9 kg)  06/12/19 162 lb (73.5 kg)    Physical Exam Vitals and nursing note reviewed.  Constitutional:      General: She is not in acute distress.    Appearance: She is well-developed. She is not diaphoretic.     Comments: Elderly 86 year old female, comfortable, cooperative  HENT:     Head: Normocephalic and atraumatic.  Eyes:     General:        Right eye: No discharge.        Left eye: No discharge.     Conjunctiva/sclera: Conjunctivae normal.  Neck:     Thyroid: No thyromegaly.  Cardiovascular:     Rate and Rhythm: Normal rate and regular rhythm.     Heart sounds: Normal heart sounds. No murmur.     Comments:  No ectopy or irregularity today Pulmonary:     Effort: Pulmonary effort is normal. No respiratory distress.     Breath sounds: Normal breath sounds. No wheezing or rales.  Musculoskeletal:        General: Normal range of motion.     Cervical back: Normal range of motion and neck supple.     Right lower leg: No edema.     Left lower leg: No edema.     Comments: Uses cane for ambulation  Good range of motion of bilateral hip flex / ext and knee flex ext, muscle strength intact 5/5 lower extremity    Lymphadenopathy:     Cervical: No cervical adenopathy.  Skin:    General: Skin is warm and dry.     Findings: No erythema or rash.  Neurological:     Mental Status: She is alert and oriented to person, place, and time.  Psychiatric:        Behavior: Behavior normal.     Comments: Well groomed, good eye contact, normal speech and thoughts    Results for orders placed or performed in visit on 02/40/97  BASIC METABOLIC PANEL WITH GFR  Result Value Ref Range   Glucose, Bld 65 65 - 99 mg/dL   BUN 27 (H) 7 - 25 mg/dL   Creat 1.22 (H) 0.60 - 0.88 mg/dL   GFR, Est Non African American 40 (L) > OR = 60 mL/min/1.70m2   GFR, Est African American 46 (L) > OR = 60 mL/min/1.39m2   BUN/Creatinine Ratio 22 6 - 22 (calc)   Sodium 137 135 - 146 mmol/L   Potassium 5.0 3.5 - 5.3 mmol/L   Chloride 109 98 - 110 mmol/L   CO2 16 (L) 20 - 32 mmol/L   Calcium 10.1 8.6 - 10.4 mg/dL      Assessment & Plan:   Problem List Items Addressed This Visit    Osteoarthritis of hip (Bilateral) (Chronic)   DDD (degenerative disc disease), lumbosacral (Chronic)   Chronic pain syndrome (Chronic)   Chronic diastolic CHF (congestive heart failure) (HCC)    Stable without acute exacerbation dCHF with preserved EF Followed by Dr Saralyn Pilar [facility] Cardiology On low dose furosemide diuretic      Centrilobular emphysema (HCC)    Stable without acute COPD exacerbation Former smoker >30 years ago Not on maintenance therapy Has nebulizer at home w/ albuterol, refilled      Relevant Medications   albuterol (PROVENTIL) (2.5 MG/3ML) 0.083% nebulizer solution   Benign hypertension with CKD (chronic kidney disease) stage III - Primary    Well-controlled HTN - Home BP readings reviewed  Complication with CKD III, dCHF    Plan:  1. Continue current BP regimen Lisinopril 10mg  daily, Furosemide 10mg  daily (half of 20mg ) 2. Encourage improved lifestyle - low sodium diet, regular exercise 3. Continue monitor BP outside  office, bring readings to next visit, if persistently >140/90 or new symptoms notify office sooner      Atrial fibrillation and flutter ([place])    Currently without active atrial fibrillation Followed by [facility] Cardiology On Anticoagulation       Other Visit Diagnoses    Sick sinus syndrome ([place])   (Chronic)      S/p pacemaker, followed by [facility] Cardiology Dr Saralyn Pilar  #Pain / OA/DJD Followed by [facility] Pain Management Dr Dossie Arbour S/p injection therapy On Oxycodone PRN.   Meds ordered this encounter  Medications  . albuterol (PROVENTIL) (2.5 MG/3ML) 0.083% nebulizer solution    Sig:  Take 3 mLs (2.5 mg total) by nebulization every 8 (eight) hours as needed for wheezing or shortness of breath.    Dispense:  75 mL    Refill:  3     Follow up plan: Return in about 6 months (around 03/31/2020) for Annual Physical.  Future labs 6 months 03/24/20  [name], DO [facility] 10/02/2019, 8:38 AM

## 2019-10-02 NOTE — Assessment & Plan Note (Signed)
Currently without active atrial fibrillation Followed by [facility] Cardiology On Anticoagulation

## 2019-10-09 ENCOUNTER — Telehealth: Payer: Self-pay | Admitting: *Deleted

## 2019-10-09 ENCOUNTER — Encounter: Payer: Self-pay | Admitting: Pain Medicine

## 2019-10-09 NOTE — Telephone Encounter (Signed)
Attempted to call the number on the chart x 3. The message "service has not been established for this number" is the outgoing message on the patient's phone.

## 2019-10-09 NOTE — Progress Notes (Signed)
Patient: Kristin [name]  Service Category: E/M  Provider: Gaspar Cola, MD  DOB: [birth year]  DOS: 10/10/2019  Location: Office  MRN: [id number]  Setting: Ambulatory outpatient  Referring Provider: [name] *  Type: Established Patient  Specialty: Interventional Pain Management  PCP: Olin Hauser, DO  Location: Remote location  Delivery: TeleHealth     Virtual Encounter - Pain Management PROVIDER NOTE: Information contained herein reflects review and annotations entered in association with encounter. Interpretation of such information and data should be left to medically-trained personnel. Information provided to patient can be located elsewhere in the medical record under "Patient Instructions". Document created using STT-dictation technology, any transcriptional errors that may result from process are unintentional.    Contact & Pharmacy Preferred: [phone] Home: [phone] (home) Mobile: There is no such number on file (mobile). E-mail: [email or web address]  [place], [address] [place] Phone: [phone] Fax: [phone]   Pre-screening  Kristin [name] offered "in-person" vs "virtual" encounter. She indicated preferring virtual for this encounter.   Reason COVID-19*  Social distancing based on CDC and AMA recommendations.   I contacted [phone] on 10/10/2019 via telephone.      I clearly identified myself as 10/23/2019, MD. I verified that I was speaking with the correct person using two identifiers (Name: Kristin [name], and date of birth: [birth year]).  Consent I sought verbal advanced consent from 16/08/1929 for virtual visit interactions. I informed Kristin [name] of possible security and privacy concerns, risks, and limitations associated with providing "not-in-person" medical evaluation and management services. I also informed Kristin [name] of the availability of  "in-person" appointments. Finally, I informed her that there would be a charge for the virtual visit and that she could be  personally, fully or partially, financially responsible for it. Ms. Thier expressed understanding and agreed to proceed.   Historic Elements   Kristin [name] is a 86 y.o. year old, female patient evaluated today after her last contact with our practice on 10/09/2019. Kristin [name]  has a past medical history of Acute postoperative pain (02/09/2018), Arrhythmia, sinus node (04/03/2014), Arthritis, Arthritis, Atrial fibrillation ([place]), Back pain, Bradycardia, Breast cancer ([place]) (2004), Cancer ([place]) (2004), CHF (congestive heart failure) ([place]), Chronic back pain, CKD (chronic kidney disease), COPD (chronic obstructive pulmonary disease) ([place]), Cystocele, Decubitus ulcers, Dehydration, Gout, Hematuria, Hepatitis C, Hyperlipidemia, Hypertension, Hypomagnesemia (06/25/2015), OAB (overactive bladder), Overactive bladder, Restless leg, Shoulder pain, bilateral, Urinary frequency, UTI (lower urinary tract infection), and Vaginal atrophy. She also  has a past surgical history that includes Abdominal hysterectomy; Back surgery; Rotator cuff repair; Breast surgery; Cataract extraction w/PHACO (10/19/2011); Cataract extraction w/PHACO (11/02/2011); Carpal tunnel release; Joint replacement; Colon surgery; Pacemaker insertion (N/A, 12/24/2015); Cholecystectomy; Breast lumpectomy (Right, 2004); and Insert / replace / remove pacemaker. Kristin [name] has a current medication list which includes the following prescription(s): acetaminophen, albuterol, apixaban, bisacodyl, calcium carbonate-vitamin d, vitamin d, furosemide, levocetirizine, lisinopril, melatonin, NONFORMULARY OR COMPOUNDED ITEM, [START ON 11/29/2019] oxycodone hcl, oxygen-helium, polyethylene glycol, ropinirole, and benefiber. She  reports that she quit smoking about 20 years ago. Her smoking use included cigarettes. She has a 50.00 pack-year smoking  history. She has quit using smokeless tobacco. She reports that she does not drink alcohol or use drugs. Kristin [name] is allergic to flexeril [cyclobenzaprine hcl] and vancomycin.   HPI  Today, she is being contacted for a post-procedure assessment.  The patient indicates that  she is doing okay, but the procedure really did not help her that much.  Because she continues to have this pain in the left lower back and hip area and over the years it has been getting worse, today I will go ahead and order a repeat MRI of the lumbar spine.  Around June of last year she had MRIs of the hips done.  They showed that the patient has osteoarthritis of the hips.  She does have a pacemaker, but apparently it is the time that you can have an MRI with evidence she had those MRIs of the hips done.  In any case, when called radiology they indicated that whenever the patient has a pacemaker, they usually have the MRIs done at [facility] in [place].  The patient is pacemaker information is as follows: Dual chamber; implant date 12/24/2015; [employer]; Model # [id number]  Post-Procedure Evaluation  Procedure (09/27/2019): Diagnostic left SI joint block #1 under fluoroscopic guidance, no IV sedation Pre-procedure pain level:  6/10 Post-procedure: 0/10 (100% relief)  Sedation: None.  Landis Martins, RN  10/09/2019 12:20 PM  Sign when Signing Visit Pain relief after procedure (treated area only): (Questions asked to patient) 1. Starting about 15 minutes after the procedure, and "while the area was still numb" (from the local anesthetics), were you having any of your usual pain "in that area" (the treated area)?  (NOTE: NOT including the discomfort from the needle sticks.) First 1 hour: 100 % better. First 4-6 hours: 25 % better. 2. How long did the numbness from the local anesthetics last? (More than 6 hours?) Duration:6 hours.  3. How much better is your pain now, when compared to before the procedure? Current benefit: 0 %  better. 4. Can you move better now? Improvement in ROM (Range of Motion): No. 5. Can you do more now? Improvement in function: No. 4. Did you have any problems with the procedure? Side-effects/Complications: No.  Current benefits: Defined as benefit that persist at this time.   Analgesia:  No benefit Function: No benefit ROM: No benefit  Pharmacotherapy Assessment  Analgesic: Oxycodone IR 20 mg, 1 tab PO q 8 hrs (60 mg/day of oxycodone) MME/day: 90 mg/day.   Monitoring:  PMP: PDMP reviewed during this encounter.       Pharmacotherapy: No side-effects or adverse reactions reported. Compliance: No problems identified. Effectiveness: Clinically acceptable. Plan: Refer to "POC".  UDS:  Summary  Date Value Ref Range Status  02/01/2018 FINAL  Final    Comment:    ==================================================================== TOXASSURE SELECT 13 (MW) ==================================================================== Test                             Result       Flag       Units Drug Present and Declared for Prescription Verification   Oxycodone                      401          EXPECTED   ng/mg creat   Oxymorphone                    438          EXPECTED   ng/mg creat   Noroxycodone                   1122         EXPECTED  ng/mg creat   Noroxymorphone                 309          EXPECTED   ng/mg creat    Sources of oxycodone are scheduled prescription medications.    Oxymorphone, noroxycodone, and noroxymorphone are expected    metabolites of oxycodone. Oxymorphone is also available as a    scheduled prescription medication. ==================================================================== Test                      Result    Flag   Units      Ref Range   Creatinine              79               mg/dL      >=20 ==================================================================== Declared Medications:  The flagging and interpretation on this report are based on  the  following declared medications.  Unexpected results may arise from  inaccuracies in the declared medications.  **Note: The testing scope of this panel includes these medications:  Oxycodone  **Note: The testing scope of this panel does not include following  reported medications:  Albuterol (Proventil)  Apixaban  Bisacodyl  Furosemide (Lasix)  Lisinopril  Melatonin  Polyethylene Glycol (MiraLAX)  Ropinirole  Vitamin D ==================================================================== For clinical consultation, please call [phone]. ====================================================================    Laboratory Chemistry Profile   Renal Lab Results  Component Value Date   BUN 27 (H) 05/08/2019   CREATININE 1.22 (H) 05/08/2019   BCR 22 05/08/2019   GFRAA 46 (L) 05/08/2019   GFRNONAA 40 (L) 05/08/2019    Hepatic Lab Results  Component Value Date   AST 21 07/25/2018   ALT 11 07/25/2018   ALBUMIN 3.6 08/22/2017   ALKPHOS 60 08/22/2017    Electrolytes Lab Results  Component Value Date   NA 137 05/08/2019   K 5.0 05/08/2019   CL 109 05/08/2019   CALCIUM 10.1 05/08/2019   MG 2.2 09/24/2015    Bone No results found.  Coagulation Lab Results  Component Value Date   INR 1.24 12/16/2015   LABPROT 15.8 (H) 12/16/2015   APTT 26 12/16/2015   PLT 139 (L) 08/15/2017    Cardiovascular Lab Results  Component Value Date   TROPONINI <0.03 08/14/2017   HGB 11.6 (L) 08/15/2017   HCT 34.8 (L) 08/15/2017    Inflammation (CRP: Acute Phase) (ESR: Chronic Phase) Lab Results  Component Value Date   CRP <0.5 09/24/2015   ESRSEDRATE 12 09/24/2015      Note: Above Lab results reviewed.  Imaging  DG PAIN CLINIC C-ARM 1-60 MIN NO REPORT Fluoro was used, but no Radiologist interpretation will be provided.  Please refer to "NOTES" tab for provider progress note.  Assessment  The primary encounter diagnosis was Chronic pain syndrome. 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  Plan of  Care  Problem-specific:  No problem-specific Assessment & Plan notes found for this encounter.  I am having [name] maintain her apixaban, OXYGEN, Vitamin D, acetaminophen, furosemide, lisinopril, levocetirizine, Benefiber, Melatonin, bisacodyl, polyethylene glycol, rOPINIRole, Oxycodone HCl, NONFORMULARY OR COMPOUNDED ITEM, Calcium Carbonate-Vitamin D, and albuterol.  Pharmacotherapy (Medications Ordered): No orders of the defined types were placed in this encounter.  Orders:  Orders Placed This Encounter  Procedures  . MR LUMBAR SPINE WO CONTRAST    Patient presents with axial pain with possible radicular component.  In addition to any acute findings, please report on:  1. Facet (Zygapophyseal) joint DJD (Hypertrophy, space narrowing, subchondral sclerosis, and/or osteophyte formation) 2. DDD and/or IVDD (Loss of disc height, desiccation or "Black disc disease") 3. Pars defects 4. Spondylolisthesis, spondylosis, and/or spondyloarthropathies (include Degree/Grade of displacement in mm) 5. Vertebral body Fractures, including age (old, new/acute) 23. Modic Type Changes 7. Demineralization 8. Bone pathology 9. Central, Lateral Recess, and/or Foraminal  Stenosis (include AP diameter of stenosis in mm) 10. Surgical changes (hardware type, status, and presence of fibrosis)  NOTE: Please specify level(s) and laterality.    Standing Status:   Future    Standing Expiration Date:   01/07/2020    Order Specific Question:   What is the patient's sedation requirement?    Answer:   No Sedation    Order Specific Question:   Does the patient have a pacemaker or implanted devices?    Answer:   No    Order Specific Question:   Preferred imaging location?    Answer:   [facility] (table limit-500 lbs)    Order Specific Question:   Call Results- Best Contact Number?    Answer:   [phone] ([facility])    Order Specific Question:   Radiology Contrast Protocol - do NOT remove file path    Answer:   \\[email or web address]   Follow-up plan:   Return in about 11 weeks (around 12/26/2019) for (VV), (MM) review results of lumbar MRI.      Interventional management options:  Considering:   NOTE: ELIQUIS ANTICOAGULATION (Stop: x3 days prior to procedure  Restart 6 hours after procedures) Diagnostic right SI joint block Possible bilateral SI joint RFA Diagnostic bilateral femoral nerve + obturator NB Possible bilateral femoral nerve + obturator nerve RFA Diagnostic right IA shoulder joint injection   Palliative PRN treatment(s):   Diagnostic left SI joint block #2  Palliative left CESI #3  Palliative left trochanteric bursa injection #2  Palliative right IA hip inj. #2  (100% on the right) Palliative left IA hip inj. #3   Palliative right lumbar facet block #3 Palliative left lumbar facet block #5 Palliative right lumbar facet RFA #2 (Last done on 09/19/18) Palliative left lumbar facet RFA #4 (Last done on 08/01/18) Palliative bilateral suprascapular nerve block Palliative bilateral suprascapular nerve RFA #2(Last done 07/13/18)    Recent Visits Date Type Provider Dept  09/27/19 Procedure visit  Milinda Pointer, MD [facility]  09/26/19 Telemedicine Milinda Pointer, MD [facility]  Showing recent visits within past 90 days and meeting all other requirements   Today's Visits Date Type Provider Dept  10/10/19 Telemedicine Milinda Pointer, MD [facility]  Showing today's visits and meeting all other requirements   Future Appointments Date Type Provider Dept  12/26/19 Appointment Milinda Pointer, MD [facility]  Showing future appointments within next 90 days and meeting all other requirements   I discussed the assessment and treatment plan  with the patient. The patient was provided an opportunity to ask questions and all were answered. The patient agreed with the plan and demonstrated an understanding of the instructions.  Patient advised to call back or seek an in-person evaluation if the symptoms or condition worsens.  Duration of encounter: 15 minutes.  Note by: Gaspar Cola, MD Date: 10/10/2019; Time: 1:02 PM

## 2019-10-09 NOTE — Progress Notes (Signed)
Pain relief after procedure (treated area only): (Questions asked to patient) 1. Starting about 15 minutes after the procedure, and "while the area was still numb" (from the local anesthetics), were you having any of your usual pain "in that area" (the treated area)?  (NOTE: NOT including the discomfort from the needle sticks.) First 1 hour: 100 % better. First 4-6 hours: 25 % better. 2. How long did the numbness from the local anesthetics last? (More than 6 hours?) Duration:6 hours.  3. How much better is your pain now, when compared to before the procedure? Current benefit: 0 % better. 4. Can you move better now? Improvement in ROM (Range of Motion): No. 5. Can you do more now? Improvement in function: No. 4. Did you have any problems with the procedure? Side-effects/Complications: No.

## 2019-10-10 ENCOUNTER — Other Ambulatory Visit: Payer: Self-pay

## 2019-10-10 ENCOUNTER — Ambulatory Visit: Payer: Medicare Other | Attending: Pain Medicine | Admitting: Pain Medicine

## 2019-10-10 DIAGNOSIS — M5441 Lumbago with sciatica, right side: Secondary | ICD-10-CM

## 2019-10-10 DIAGNOSIS — M5136 Other intervertebral disc degeneration, lumbar region: Secondary | ICD-10-CM | POA: Diagnosis not present

## 2019-10-10 DIAGNOSIS — M5442 Lumbago with sciatica, left side: Secondary | ICD-10-CM | POA: Diagnosis not present

## 2019-10-10 DIAGNOSIS — M4310 Spondylolisthesis, site unspecified: Secondary | ICD-10-CM

## 2019-10-10 DIAGNOSIS — G96198 Other disorders of meninges, not elsewhere classified: Secondary | ICD-10-CM

## 2019-10-10 DIAGNOSIS — G894 Chronic pain syndrome: Secondary | ICD-10-CM

## 2019-10-10 DIAGNOSIS — M47816 Spondylosis without myelopathy or radiculopathy, lumbar region: Secondary | ICD-10-CM

## 2019-10-10 DIAGNOSIS — M5412 Radiculopathy, cervical region: Secondary | ICD-10-CM | POA: Diagnosis not present

## 2019-10-10 DIAGNOSIS — M79604 Pain in right leg: Secondary | ICD-10-CM | POA: Diagnosis not present

## 2019-10-10 DIAGNOSIS — G8929 Other chronic pain: Secondary | ICD-10-CM

## 2019-10-10 DIAGNOSIS — M4807 Spinal stenosis, lumbosacral region: Secondary | ICD-10-CM

## 2019-10-10 DIAGNOSIS — M5137 Other intervertebral disc degeneration, lumbosacral region: Secondary | ICD-10-CM

## 2019-10-10 DIAGNOSIS — M79605 Pain in left leg: Secondary | ICD-10-CM

## 2019-10-10 DIAGNOSIS — Z950 Presence of cardiac pacemaker: Secondary | ICD-10-CM

## 2019-10-10 DIAGNOSIS — M48061 Spinal stenosis, lumbar region without neurogenic claudication: Secondary | ICD-10-CM

## 2019-10-10 DIAGNOSIS — M961 Postlaminectomy syndrome, not elsewhere classified: Secondary | ICD-10-CM

## 2019-10-10 DIAGNOSIS — M5416 Radiculopathy, lumbar region: Secondary | ICD-10-CM

## 2019-10-12 DIAGNOSIS — J45998 Other asthma: Secondary | ICD-10-CM | POA: Diagnosis not present

## 2019-10-16 ENCOUNTER — Telehealth: Payer: Self-pay | Admitting: *Deleted

## 2019-10-24 ENCOUNTER — Ambulatory Visit (INDEPENDENT_AMBULATORY_CARE_PROVIDER_SITE_OTHER): Payer: Medicare Other | Admitting: Pharmacist

## 2019-10-24 ENCOUNTER — Telehealth: Payer: Self-pay

## 2019-10-24 DIAGNOSIS — N183 Chronic kidney disease, stage 3 unspecified: Secondary | ICD-10-CM

## 2019-10-24 DIAGNOSIS — I129 Hypertensive chronic kidney disease with stage 1 through stage 4 chronic kidney disease, or unspecified chronic kidney disease: Secondary | ICD-10-CM

## 2019-10-24 NOTE — Patient Instructions (Signed)
Thank you allowing the Chronic Care Management Team to be a part of your care! It was a pleasure speaking with you today!     CCM (Chronic Care Management) Team    Noreene Larsson RN, MSN, CCM Nurse Care Coordinator  [phone]   Harlow Asa PharmD  Clinical Pharmacist  [phone]   Eula Fried LCSW Clinical Social Worker [phone]  Visit Information  Goals Addressed            This Visit's Progress   . COMPLETED: PharmD - [place]       Current Barriers:  . Financial Barriers - currently in coverage gap of Part D coverage    Pharmacist Clinical Goal(s):  Marland Kitchen Over the next 30 days, patient will work with CM Pharmacist to address needs related to medication assistance  Interventions: . Perform chart review . Follow up with patient regarding importance of medication adherence o Confirms using weekly pillbox as adherence aid . Counsel on benefits of home BP monitoring o Confirms receiving BP monitoring log in mail o Reports recent results ~110/70-80s, HR 75-80 o Encourage patient to continue checking BP, keep log and bring record with her to medical appointments . Review the process for applying for patient assistance for Eliquis through [employer] for 2021 ? Patient to reach out to [facility] Pharmacist when close to meeting out of pocket expenditure requirement for calendar year  Patient Self Care Activities:  . Attends all scheduled provider appointments . Calls pharmacy for medication refills . Calls provider office for new concerns or questions  Please see past updates related to this goal by clicking on the "Past Updates" button in the selected goal         Patient verbalizes understanding of instructions provided today.   Patient has been provided with contact information for CM Pharmacist and has been advised to call with any medication related questions or concerns.   Harlow Asa, PharmD, [place] [employer] [phone]

## 2019-10-24 NOTE — Chronic Care Management (AMB) (Signed)
Chronic Care Management   Follow Up Note   10/24/2019 Name: GUSTIE BOBB MRN: [id number] DOB: [birth year]  Referred by: Olin Hauser, DO Reason for referral : Chronic Care Management (Patient Phone Call)   TYKERRIA MCCUBBINS is a 86 y.o. year old female who is a primary care patient of Olin Hauser, DO. The CCM team was consulted for assistance with chronic disease management and care coordination needs.  Ms. Eustice has a past medical history including but not limited to hypertension, CKD, Chronic heart failure with preserved ejection fraction, COPD, atrial fibrillation, seasonal allergies, chronic pain, restless leg syndrome and insomnia.     I reached out to [name] by phone today.   Review of patient status, including review of consultants reports, relevant laboratory and other test results, and collaboration with appropriate care team members and the patient's provider was performed as part of comprehensive patient evaluation and provision of chronic care management services.      Outpatient Encounter Medications as of 10/24/2019  Medication Sig Note  . acetaminophen (TYLENOL) 500 MG tablet Take 500 mg by mouth 3 (three) times daily.   Marland Kitchen albuterol (PROVENTIL) (2.5 MG/3ML) 0.083% nebulizer solution Take 3 mLs (2.5 mg total) by nebulization every 8 (eight) hours as needed for wheezing or shortness of breath.   Marland Kitchen apixaban (ELIQUIS) 5 MG TABS tablet Take 1 tablet (5 mg total) by mouth 2 (two) times daily.   . bisacodyl (DULCOLAX) 5 MG EC tablet Take 2 tablets (10 mg total) by mouth daily as needed for moderate constipation.   . Calcium Carbonate-Vitamin D 600-200 MG-UNIT TABS Take by mouth.   . Cholecalciferol (VITAMIN D) 2000 units tablet Take 2,000 Units by mouth daily.    . furosemide (LASIX) 20 MG tablet TAKE (1/2) TABLET BY MOUTH ONCE DAILY.   Marland Kitchen levocetirizine (XYZAL) 5 MG tablet Take 0.5 tablets (2.5 mg total) by mouth every other day.   . lisinopril (ZESTRIL)  10 MG tablet Take 1 tablet (10 mg total) by mouth daily.   . Melatonin 10 MG CAPS Take 10 mg by mouth at bedtime as needed.   . NONFORMULARY OR COMPOUNDED ITEM Sig: Apply 1-2 gm(s) (2-4 pumps) to affected area, 3-4 times/day. (1 pump = 0.5 gm)   . [START ON 11/29/2019] Oxycodone HCl 20 MG TABS Take 1 tablet (20 mg total) by mouth every 8 (eight) hours as needed. Must last 30 days 10/10/2019: Future Prescription, NOT DUPLICATES. >>>DO NOT DELETE, even if Expired!!!<<< See Care Coordination Note from [facility] (Dr. Dossie Arbour)  . OXYGEN Inhale 2 L into the lungs at bedtime.  04/25/2018: At night as needed  . polyethylene glycol (MIRALAX / GLYCOLAX) 17 g packet Take 17 g by mouth daily as needed for moderate constipation. Must last 30 days   . rOPINIRole (REQUIP) 2 MG tablet Take 2 tablets (4 mg total) by mouth at bedtime. Must last 30 days   . [place] Dextrin (BENEFIBER) POWD Take 6 g by mouth 3 (three) times daily before meals. (2 tsp = 6 g)    No facility-administered encounter medications on file as of 10/24/2019.    Goals Addressed            This Visit's Progress   . COMPLETED: PharmD - [place]       Current Barriers:  . Financial Barriers - currently in coverage gap of Part D coverage    Pharmacist Clinical Goal(s):  Marland Kitchen Over the next 30 days, patient  will work with CM Pharmacist to address needs related to medication assistance  Interventions: . Perform chart review . Follow up with patient regarding importance of medication adherence o Confirms using weekly pillbox as adherence aid . Counsel on benefits of home BP monitoring o Confirms receiving BP monitoring log in mail o Reports recent results ~110/70-80s, HR 75-80 o Encourage patient to continue checking BP, keep log and bring record with her to medical appointments . Review the process for applying for patient assistance for Eliquis through [employer] for 2021 ? Patient to reach out to [facility] Pharmacist  when close to meeting out of pocket expenditure requirement for calendar year  Patient Self Care Activities:  . Attends all scheduled provider appointments . Calls pharmacy for medication refills . Calls provider office for new concerns or questions  Please see past updates related to this goal by clicking on the "Past Updates" button in the selected goal         Plan  1) Patient denies any further medication questions/concerns at this time 2) Patient to reach out to [facility] Pharmacist when close to meeting out of pocket expenditure requirement for Eliquis patient assistance program 3) Patient has been provided with contact information for CM Pharmacist and has been advised to call with any medication related questions or concerns.   Harlow Asa, PharmD, [place] [employer] [phone]

## 2019-10-25 ENCOUNTER — Telehealth: Payer: Self-pay

## 2019-11-02 ENCOUNTER — Other Ambulatory Visit: Payer: Self-pay

## 2019-11-02 ENCOUNTER — Ambulatory Visit (HOSPITAL_COMMUNITY)
Admission: RE | Admit: 2019-11-02 | Discharge: 2019-11-02 | Disposition: A | Discharge status: Home / Self Care | Payer: Medicare Other | Source: Ambulatory Visit | Attending: Pain Medicine | Admitting: Pain Medicine

## 2019-11-02 DIAGNOSIS — G8929 Other chronic pain: Secondary | ICD-10-CM | POA: Diagnosis not present

## 2019-11-02 DIAGNOSIS — M47816 Spondylosis without myelopathy or radiculopathy, lumbar region: Secondary | ICD-10-CM | POA: Diagnosis not present

## 2019-11-02 DIAGNOSIS — M5126 Other intervertebral disc displacement, lumbar region: Secondary | ICD-10-CM | POA: Diagnosis not present

## 2019-11-02 DIAGNOSIS — M48061 Spinal stenosis, lumbar region without neurogenic claudication: Secondary | ICD-10-CM | POA: Diagnosis not present

## 2019-11-02 DIAGNOSIS — M5416 Radiculopathy, lumbar region: Secondary | ICD-10-CM | POA: Diagnosis not present

## 2019-11-02 DIAGNOSIS — M5136 Other intervertebral disc degeneration, lumbar region: Secondary | ICD-10-CM | POA: Diagnosis not present

## 2019-11-02 DIAGNOSIS — M961 Postlaminectomy syndrome, not elsewhere classified: Secondary | ICD-10-CM

## 2019-11-02 DIAGNOSIS — G96198 Other disorders of meninges, not elsewhere classified: Secondary | ICD-10-CM | POA: Diagnosis not present

## 2019-11-02 DIAGNOSIS — M4807 Spinal stenosis, lumbosacral region: Secondary | ICD-10-CM | POA: Insufficient documentation

## 2019-11-02 DIAGNOSIS — M4310 Spondylolisthesis, site unspecified: Secondary | ICD-10-CM

## 2019-11-02 DIAGNOSIS — M5137 Other intervertebral disc degeneration, lumbosacral region: Secondary | ICD-10-CM | POA: Insufficient documentation

## 2019-11-06 ENCOUNTER — Ambulatory Visit: Payer: Medicare Other | Admitting: Nurse Practitioner

## 2019-12-11 DIAGNOSIS — I495 Sick sinus syndrome: Secondary | ICD-10-CM | POA: Diagnosis not present

## 2019-12-19 ENCOUNTER — Encounter: Payer: Self-pay | Admitting: Pain Medicine

## 2019-12-19 NOTE — Progress Notes (Signed)
Patient would like to review MRI results of her back. She is interested in having a procedure.

## 2019-12-19 NOTE — Progress Notes (Signed)
Patient: Kristin [name]  Service Category: E/M  Provider: Gaspar Cola, MD  DOB: 05-26-1930  DOS: 12/20/2019  Location: Office  MRN: [id number]  Setting: Ambulatory outpatient  Referring Provider: [name] *  Type: Established Patient  Specialty: Interventional Pain Management  PCP: Olin Hauser, DO  Location: Remote location  Delivery: TeleHealth     Virtual Encounter - Pain Management PROVIDER NOTE: Information contained herein reflects review and annotations entered in association with encounter. Interpretation of such information and data should be left to medically-trained personnel. Information provided to patient can be located elsewhere in the medical record under "Patient Instructions". Document created using STT-dictation technology, any transcriptional errors that may result from process are unintentional.    Contact & Pharmacy Preferred: [phone] Home: [phone] (home) Mobile: There is no such number on file (mobile). E-mail: [email or web address]  [place], [address] [place] Phone: [phone] Fax: [phone]   Pre-screening  Kristin [name] offered "in-person" vs "virtual" encounter. She indicated preferring virtual for this encounter.   Reason COVID-19*  Social distancing based on CDC and AMA recommendations.   I contacted [phone] on 12/20/2019 via telephone.      I clearly identified myself as 01/02/2020, MD. I verified that I was speaking with the correct person using two identifiers (Name: Kristin [name], and date of birth: 27-Jul-1930).  Consent I sought verbal advanced consent from 16/08/1931 for virtual visit interactions. I informed Ms. Jeffrey of possible security and privacy concerns, risks, and limitations associated with providing "not-in-person" medical evaluation and management services. I also informed Ms. Lindell of the availability of  "in-person" appointments. Finally, I informed her that there would be a charge for the virtual visit and that she could be  personally, fully or partially, financially responsible for it. Ms. Deweese expressed understanding and agreed to proceed.   Historic Elements   Kristin [name] is a 86 y.o. year old, female patient evaluated today after her last contact with our practice on 10/16/2019. Ms. Monsivais  has a past medical history of Acute postoperative pain (02/09/2018), Arrhythmia, sinus node (04/03/2014), Arthritis, Arthritis, Atrial fibrillation ([place]), Back pain, Bradycardia, Breast cancer ([place]) (2004), Cancer ([place]) (2004), CHF (congestive heart failure) ([place]), Chronic back pain, CKD (chronic kidney disease), COPD (chronic obstructive pulmonary disease) ([place]), Cystocele, Decubitus ulcers, Dehydration, Gout, Hematuria, Hepatitis C, Hyperlipidemia, Hypertension, Hypomagnesemia (06/25/2015), OAB (overactive bladder), Overactive bladder, Restless leg, Shoulder pain, bilateral, Urinary frequency, UTI (lower urinary tract infection), and Vaginal atrophy. She also  has a past surgical history that includes Abdominal hysterectomy; Back surgery; Rotator cuff repair; Breast surgery; Cataract extraction w/PHACO (10/19/2011); Cataract extraction w/PHACO (11/02/2011); Carpal tunnel release; Joint replacement; Colon surgery; Pacemaker insertion (N/A, 12/24/2015); Cholecystectomy; Breast lumpectomy (Right, 2004); and Insert / replace / remove pacemaker. Ms. Scoggin has a current medication list which includes the following prescription(s): acetaminophen, albuterol, apixaban, bisacodyl, calcium carbonate-vitamin d, vitamin d, furosemide, levocetirizine, lisinopril, melatonin, [START ON 12/29/2019] oxycodone hcl, [START ON 01/28/2020] oxycodone hcl, [START ON 02/27/2020] oxycodone hcl, oxygen-helium, polyethylene glycol, ropinirole, [START ON 01/28/2020] ropinirole, and benefiber. She  reports that she quit smoking about 21 years ago. Her  smoking use included cigarettes. She has a 50.00 pack-year smoking history. She has quit using smokeless tobacco. She reports that she does not drink alcohol or use drugs. Ms. Mcever is allergic to flexeril [cyclobenzaprine hcl] and vancomycin.   HPI  Today, she is being  contacted for medication management. The patient indicates doing well with the current medication regimen. No adverse reactions or side effects reported to the medications.  The patient indicates that she is having a flareup of her low back pain and she would like to come in for a procedure.  She indicates that the low back pain is bilateral but the left side seems to be worse than the right.  Currently she is having absolutely no lower extremity pain.  Today we have reviewed the results of her recent MRI.  It shows that she has multilevel foraminal stenosis and lumbar facet arthropathy as well as advanced DDD of the lumbar spine.  Pharmacotherapy Assessment  Analgesic: Oxycodone IR 20 mg, 1 tab PO q 8 hrs (60 mg/day of oxycodone) MME/day: 90 mg/day.   Monitoring: [place] PMP: PDMP reviewed during this encounter.       Pharmacotherapy: No side-effects or adverse reactions reported. Compliance: No problems identified. Effectiveness: Clinically acceptable. Plan: Refer to "POC".  UDS:  Summary  Date Value Ref Range Status  02/01/2018 FINAL  Final    Comment:    ==================================================================== TOXASSURE SELECT 13 (MW) ==================================================================== Test                             Result       Flag       Units Drug Present and Declared for Prescription Verification   Oxycodone                      401          EXPECTED   ng/mg creat   Oxymorphone                    438          EXPECTED   ng/mg creat   Noroxycodone                   1122         EXPECTED   ng/mg creat   Noroxymorphone                 309          EXPECTED   ng/mg creat    Sources of  oxycodone are scheduled prescription medications.    Oxymorphone, noroxycodone, and noroxymorphone are expected    metabolites of oxycodone. Oxymorphone is also available as a    scheduled prescription medication. ==================================================================== Test                      Result    Flag   Units      Ref Range   Creatinine              79               mg/dL      >=20 ==================================================================== Declared Medications:  The flagging and interpretation on this report are based on the  following declared medications.  Unexpected results may arise from  inaccuracies in the declared medications.  **Note: The testing scope of this panel includes these medications:  Oxycodone  **Note: The testing scope of this panel does not include following  reported medications:  Albuterol (Proventil)  Apixaban  Bisacodyl  Furosemide (Lasix)  Lisinopril  Melatonin  Polyethylene Glycol (MiraLAX)  Ropinirole  Vitamin D ==================================================================== For clinical consultation, please call [phone]. ====================================================================  Laboratory Chemistry Profile   Renal Lab Results  Component Value Date   BUN 27 (H) 05/08/2019   CREATININE 1.22 (H) 05/08/2019   BCR 22 05/08/2019   GFRAA 46 (L) 05/08/2019   GFRNONAA 40 (L) 05/08/2019     Hepatic Lab Results  Component Value Date   AST 21 07/25/2018   ALT 11 07/25/2018   ALBUMIN 3.6 08/22/2017   ALKPHOS 60 08/22/2017     Electrolytes Lab Results  Component Value Date   NA 137 05/08/2019   K 5.0 05/08/2019   CL 109 05/08/2019   CALCIUM 10.1 05/08/2019   MG 2.2 09/24/2015     Bone No results found for: VD25OH, VD125OH2TOT, ZO1096EA5, WU9811BJ4, 25OHVITD1, 25OHVITD2, 25OHVITD3, TESTOFREE, TESTOSTERONE   Inflammation (CRP: Acute Phase) (ESR: Chronic Phase) Lab Results  Component  Value Date   CRP <0.5 09/24/2015   ESRSEDRATE 12 09/24/2015       Note: Above Lab results reviewed.  Imaging  MR LUMBAR SPINE WO CONTRAST CLINICAL DATA:  Osteoarthritis  EXAM: MRI LUMBAR SPINE WITHOUT CONTRAST  TECHNIQUE: Multiplanar, multisequence MR imaging of the lumbar spine was performed. No intravenous contrast was administered.  COMPARISON:  01/21/2011  FINDINGS: Segmentation:  5 lumbar type vertebrae  Alignment:  Levoscoliosis and L5-S1 anterolisthesis.  Vertebrae:  No fracture, evidence of discitis, or bone lesion.  Conus medullaris and cauda equina: Conus extends to the L1-2 level. Conus and cauda equina appear normal.  Paraspinal and other soft tissues: Renal cysts and a subcentimeter hypointensity in the lower pole right kidney which is stable and likely a proteinaceous cyst. There is atrophy of intrinsic back muscles with scarring centered at the level of L4 posteriorly.  Disc levels:  T12- L1: Unremarkable.  L1-L2: Disc narrowing and bulging with endplate ridging. Right-sided facet spurring with moderate foraminal narrowing.  L2-L3: Disc narrowing and endplate degeneration asymmetric to the right where there is bulky spurring. Asymmetric right facet spurring. Right foraminal stenosis that is moderate to advanced. There is right subarticular recess narrowing without static L3 compression  L3-L4: Disc narrowing and bulging. Mild facet spurring. No noted impingement  L4-L5: Disc narrowing and endplate degeneration that is advanced. There is asymmetric left-sided height loss. Degenerative facet spurring on both sides. The canal is patent after left-sided laminotomy. Left foraminal impingement is severe and progressive, with flattening of L4.  L5-S1:Facet degeneration with spurring and mild anterolisthesis. Advanced disc narrowing with bulging and endplate ridging. Biforaminal impingement with L5 compression.  IMPRESSION: 1. Advanced and diffuse  degenerative disease with levoscoliosis and L5-S1 anterolisthesis. 2. High-grade foraminal stenosis on the right at L2-3 and L5-S1 and on the left at L4-5 and L5-S1. 3. Diffusely patent spinal canal including at the level of prior L4-5 decompression.  Electronically Signed   By: Monte Fantasia M.D.   On: 11/03/2019 06:47  Assessment  The primary encounter diagnosis was Chronic pain syndrome. 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  Plan of Care  Problem-specific:  No problem-specific Assessment & Plan notes found for this encounter.  Kristin [name] has a current medication list which includes the following long-term medication(s): albuterol, apixaban, bisacodyl,  furosemide, levocetirizine, lisinopril, melatonin, [START ON 12/29/2019] oxycodone hcl, [START ON 01/28/2020] oxycodone hcl, [START ON 02/27/2020] oxycodone hcl, polyethylene glycol, ropinirole, [START ON 01/28/2020] ropinirole, and benefiber.  Pharmacotherapy (Medications Ordered): Meds ordered this encounter  Medications  . Oxycodone HCl 20 MG TABS    Sig: Take 1 tablet (20 mg total) by mouth every 8 (eight) hours as needed. Must last 30 days    Dispense:  90 tablet    Refill:  0    Chronic Pain: STOP Act (Not applicable) Fill 1 day early if closed on refill date. Do not fill until: 12/29/2019. To last until: 01/28/2020. Avoid benzodiazepines within 8 hours of opioids  . Oxycodone HCl 20 MG TABS    Sig: Take 1 tablet (20 mg total) by mouth every 8 (eight) hours as  needed. Must last 30 days    Dispense:  90 tablet    Refill:  0    Chronic Pain: STOP Act (Not applicable) Fill 1 day early if closed on refill date. Do not fill until: 01/28/2020. To last until: 02/27/2020. Avoid benzodiazepines within 8 hours of opioids  . Oxycodone HCl 20 MG TABS    Sig: Take 1 tablet (20 mg total) by mouth every 8 (eight) hours as needed. Must last 30 days    Dispense:  90 tablet    Refill:  0    Chronic Pain: STOP Act (Not applicable) Fill 1 day early if closed on refill date. Do not fill until: 02/27/2020. To last until: 03/28/2020. Avoid benzodiazepines within 8 hours of opioids  . rOPINIRole (REQUIP) 2 MG tablet    Sig: Take 2 tablets (4 mg total) by mouth at bedtime. Must last 30 days    Dispense:  60 tablet    Refill:  5    Fill one day early if pharmacy is closed on scheduled refill date. May substitute for generic if available.  . Melatonin 10 MG CAPS    Sig: Take 10 mg by mouth at bedtime.    Dispense:  30 capsule    Refill:  5    Fill one day early if pharmacy is closed on scheduled refill date. May substitute for generic or OTC, if available.   Orders:  Orders Placed This Encounter  Procedures  . LUMBAR FACET(MEDIAL BRANCH NERVE BLOCK) MBNB    Standing Status:   Future    Standing Expiration Date:   01/19/2020    Scheduling Instructions:     Procedure: Lumbar facet block (AKA.: Lumbosacral medial branch nerve block)     Side: Bilateral     Level: L3-4, L4-5, & L5-S1 Facets (L2, L3, L4, L5, & S1 Medial Branch Nerves)     Sedation: Patient's choice.     Timeframe: ASAA    Order Specific Question:   Where will this procedure be performed?    Answer:   [facility]  . ToxASSURE Select 13 (MW), Urine    Volume: 30 ml(s). Minimum 3 ml of urine is needed. Document temperature of fresh sample. Indications: Long term (current) use of opiate analgesic [phone])    Order Specific Question:   Release to patient    Answer:   Immediate   Follow-up plan:    Return in about 3 months (around 03/26/2020) for (F2F), (MM), in addition, Procedure (w/ sedation): (B) L-FCT Blk, (Blood-thinner Protocol).      Interventional management options:  Considering:   NOTE: ELIQUIS ANTICOAGULATION (Stop: x3 days prior to procedure  Restart 6 hours after procedures)  Diagnostic right SI joint block Possible bilateral SI joint RFA Diagnostic bilateral femoral nerve + obturator NB Possible bilateral femoral nerve + obturator nerve RFA Diagnostic right IA shoulder joint injection   Palliative PRN treatment(s):   Diagnostic left SI joint block #2  Palliative left CESI #3  Palliative left trochanteric bursa injection #2  Palliative right IA hip inj. #2  (100% on the right) Palliative left IA hip inj. #3   Palliative right lumbar facet block #3 Palliative left lumbar facet block #5 Palliative right lumbar facet RFA #2 (Last done on 09/19/18) Palliative left lumbar facet RFA #4 (Last done on 08/01/18) Palliative bilateral suprascapular nerve block Palliative bilateral suprascapular nerve RFA #2(Last done 07/13/18)     Recent Visits Date Type Provider Dept  10/10/19 Telemedicine Milinda Pointer, [facility]  09/27/19 Procedure visit Milinda Pointer, MD [facility]  09/26/19 Telemedicine Milinda Pointer, MD [facility]  Showing recent visits within past 90 days and meeting all other requirements   Today's Visits Date Type Provider Dept  12/20/19 Telemedicine Milinda Pointer, MD [facility]  Showing today's visits and meeting all other requirements   Future Appointments No visits were found meeting these conditions.  Showing future appointments within next 90 days and meeting all other requirements   I discussed the assessment and treatment plan with the patient. The patient was provided an opportunity to ask questions and all were answered. The patient agreed with the plan and demonstrated an  understanding of the instructions.  Patient advised to call back or seek an in-person evaluation if the symptoms or condition worsens.  Duration of encounter: 15 minutes.  Note by: Gaspar Cola, MD Date: 12/20/2019; Time: 1:49 PM

## 2019-12-20 ENCOUNTER — Ambulatory Visit: Payer: Medicare Other | Attending: Pain Medicine | Admitting: Pain Medicine

## 2019-12-20 ENCOUNTER — Other Ambulatory Visit: Payer: Self-pay

## 2019-12-20 DIAGNOSIS — G8929 Other chronic pain: Secondary | ICD-10-CM

## 2019-12-20 DIAGNOSIS — M79605 Pain in left leg: Secondary | ICD-10-CM

## 2019-12-20 DIAGNOSIS — G894 Chronic pain syndrome: Secondary | ICD-10-CM | POA: Diagnosis not present

## 2019-12-20 DIAGNOSIS — M79604 Pain in right leg: Secondary | ICD-10-CM | POA: Diagnosis not present

## 2019-12-20 DIAGNOSIS — G96198 Other disorders of meninges, not elsewhere classified: Secondary | ICD-10-CM

## 2019-12-20 DIAGNOSIS — M47816 Spondylosis without myelopathy or radiculopathy, lumbar region: Secondary | ICD-10-CM

## 2019-12-20 DIAGNOSIS — G2581 Restless legs syndrome: Secondary | ICD-10-CM

## 2019-12-20 DIAGNOSIS — M961 Postlaminectomy syndrome, not elsewhere classified: Secondary | ICD-10-CM

## 2019-12-20 DIAGNOSIS — M5412 Radiculopathy, cervical region: Secondary | ICD-10-CM | POA: Diagnosis not present

## 2019-12-20 DIAGNOSIS — Z79899 Other long term (current) drug therapy: Secondary | ICD-10-CM

## 2019-12-20 DIAGNOSIS — M5441 Lumbago with sciatica, right side: Secondary | ICD-10-CM

## 2019-12-20 DIAGNOSIS — M5442 Lumbago with sciatica, left side: Secondary | ICD-10-CM | POA: Diagnosis not present

## 2019-12-20 DIAGNOSIS — G4700 Insomnia, unspecified: Secondary | ICD-10-CM

## 2019-12-20 MED ORDER — MELATONIN 10 MG PO CAPS: 10.0000 mg | ORAL_CAPSULE | Freq: Every day | ORAL | 5 refills | Status: DC

## 2019-12-20 MED ORDER — OXYCODONE HCL 20 MG PO TABS: 20.0000 mg | ORAL_TABLET | Freq: Three times a day (TID) | ORAL | 0 refills | Status: DC | PRN

## 2019-12-20 MED ORDER — ROPINIROLE HCL 2 MG PO TABS: 4.0000 mg | ORAL_TABLET | Freq: Every day | ORAL | 5 refills | Status: DC

## 2019-12-20 NOTE — Patient Instructions (Signed)
____________________________________________________________________________________________  Preparing for Procedure with Sedation  Procedure appointments are limited to planned procedures: . No Prescription Refills. . No disability issues will be discussed. . No medication changes will be discussed.  Instructions: . Oral Intake: Do not eat or drink anything for at least 3 hours prior to your procedure. (Exception: Blood Pressure Medication. See below.) . Transportation: Unless otherwise stated by your physician, you may drive yourself after the procedure. . Blood Pressure Medicine: Do not forget to take your blood pressure medicine with a sip of water the morning of the procedure. If your Diastolic (lower reading)is above 100 mmHg, elective cases will be cancelled/rescheduled. . Blood thinners: These will need to be stopped for procedures. Notify our staff if you are taking any blood thinners. Depending on which one you take, there will be specific instructions on how and when to stop it. . Diabetics on insulin: Notify the staff so that you can be scheduled 1st case in the morning. If your diabetes requires high dose insulin, take only  of your normal insulin dose the morning of the procedure and notify the staff that you have done so. . Preventing infections: Shower with an antibacterial soap the morning of your procedure. . Build-up your immune system: Take 1000 mg of Vitamin C with every meal (3 times a day) the day prior to your procedure. Marland Kitchen Antibiotics: Inform the staff if you have a condition or reason that requires you to take antibiotics before dental procedures. . Pregnancy: If you are pregnant, call and cancel the procedure. . Sickness: If you have a cold, fever, or any active infections, call and cancel the procedure. . Arrival: You must be in the facility at least 30 minutes prior to your scheduled procedure. . Children: Do not bring children with you. . Dress appropriately:  Bring dark clothing that you would not mind if they get stained. . Valuables: Do not bring any jewelry or valuables.  Reasons to call and reschedule or cancel your procedure: (Following these recommendations will minimize the risk of a serious complication.) . Surgeries: Avoid having procedures within 2 weeks of any surgery. (Avoid for 2 weeks before or after any surgery). . Flu Shots: Avoid having procedures within 2 weeks of a flu shots or . (Avoid for 2 weeks before or after immunizations). . Barium: Avoid having a procedure within 7-10 days after having had a radiological study involving the use of radiological contrast. (Myelograms, Barium swallow or enema study). . Heart attacks: Avoid any elective procedures or surgeries for the initial 6 months after a "Myocardial Infarction" (Heart Attack). . Blood thinners: It is imperative that you stop these medications before procedures. Let us know if you if you take any blood thinner.  . Infection: Avoid procedures during or within two weeks of an infection (including chest colds or gastrointestinal problems). Symptoms associated with infections include: Localized redness, fever, chills, night sweats or profuse sweating, burning sensation when voiding, cough, congestion, stuffiness, runny nose, sore throat, diarrhea, nausea, vomiting, cold or Flu symptoms, recent or current infections. It is specially important if the infection is over the area that we intend to treat. Marland Kitchen Heart and lung problems: Symptoms that may suggest an active cardiopulmonary problem include: cough, chest pain, breathing difficulties or shortness of breath, dizziness, ankle swelling, uncontrolled high or unusually low blood pressure, and/or palpitations. If you are experiencing any of these symptoms, cancel your procedure and contact your primary care physician for an evaluation.  Remember:  Regular Business hours are:  Monday to Thursday 8:00 AM to 4:00 PM  Provider's  Schedule: Milinda Pointer, MD:  Procedure days: Tuesday and Thursday 7:30 AM to 4:00 PM  Gillis Santa, MD:  Procedure days: Monday and Wednesday 7:30 AM to 4:00 PM ____________________________________________________________________________________________   ____________________________________________________________________________________________  Blood Thinners  IMPORTANT NOTICE:  If you take any of these, make sure to notify the nursing staff.  Failure to do so may result in injury.  Recommended time intervals to stop and restart blood-thinners, before & after invasive procedures  Generic Name Brand Name Stop Time. Must be stopped at least this long before procedures. After procedures, wait at least this long before re-starting.  Abciximab Reopro 15 days 2 hrs  Alteplase Activase 10 days 10 days  Anagrelide Agrylin    Apixaban Eliquis 3 days 6 hrs  Cilostazol Pletal 3 days 5 hrs  Clopidogrel Plavix 7-10 days 2 hrs  Dabigatran Pradaxa 5 days 6 hrs  Dalteparin Fragmin 24 hours 4 hrs  Dipyridamole Aggrenox 11days 2 hrs  Edoxaban Lixiana; Savaysa 3 days 2 hrs  Enoxaparin  Lovenox 24 hours 4 hrs  Eptifibatide Integrillin 8 hours 2 hrs  Fondaparinux  Arixtra 72 hours 12 hrs  Prasugrel Effient 7-10 days 6 hrs  Reteplase Retavase 10 days 10 days  Rivaroxaban Xarelto 3 days 6 hrs  Ticagrelor Brilinta 5-7 days 6 hrs  Ticlopidine Ticlid 10-14 days 2 hrs  Tinzaparin Innohep 24 hours 4 hrs  Tirofiban Aggrastat 8 hours 2 hrs  Warfarin Coumadin 5 days 2 hrs   Other medications with blood-thinning effects  Product indications Generic (Brand) names Note  Cholesterol Lipitor Stop 4 days before procedure  Blood thinner (injectable) Heparin (LMW or LMWH Heparin) Stop 24 hours before procedure  Cancer Ibrutinib (Imbruvica) Stop 7 days before procedure  Malaria/Rheumatoid Hydroxychloroquine (Plaquenil) Stop 11 days before procedure  Thrombolytics  10 days before or after procedures    Over-the-counter (OTC) Products with blood-thinning effects  Product Common names Stop Time  Aspirin > 325 mg Goody Powders, Excedrin, etc. 11 days  Aspirin ? 81 mg  7 days  Fish oil  4 days  Garlic supplements  7 days  Ginkgo biloba  36 hours  Ginseng  24 hours  NSAIDs Ibuprofen, Naprosyn, etc. 3 days  Vitamin E  4 days   ____________________________________________________________________________________________

## 2019-12-26 ENCOUNTER — Telehealth: Payer: Medicare Other | Admitting: Pain Medicine

## 2020-01-08 ENCOUNTER — Other Ambulatory Visit
Admission: RE | Admit: 2020-01-08 | Discharge: 2020-01-08 | Disposition: A | Discharge status: Home / Self Care | Payer: Medicare Other | Source: Ambulatory Visit | Attending: Pain Medicine | Admitting: Pain Medicine

## 2020-01-08 DIAGNOSIS — G894 Chronic pain syndrome: Secondary | ICD-10-CM | POA: Insufficient documentation

## 2020-01-08 DIAGNOSIS — Z79899 Other long term (current) drug therapy: Secondary | ICD-10-CM | POA: Diagnosis not present

## 2020-01-10 ENCOUNTER — Encounter: Payer: Self-pay | Admitting: Pain Medicine

## 2020-01-10 ENCOUNTER — Ambulatory Visit
Admission: RE | Admit: 2020-01-10 | Discharge: 2020-01-10 | Disposition: A | Discharge status: Home / Self Care | Payer: Medicare Other | Source: Ambulatory Visit | Attending: Pain Medicine | Admitting: Pain Medicine

## 2020-01-10 ENCOUNTER — Ambulatory Visit (HOSPITAL_BASED_OUTPATIENT_CLINIC_OR_DEPARTMENT_OTHER): Payer: Medicare Other | Admitting: Pain Medicine

## 2020-01-10 ENCOUNTER — Other Ambulatory Visit: Payer: Self-pay

## 2020-01-10 VITALS — BP 137/80 | HR 71 | Temp 97.8°F | Resp 14 | Ht 66.0 in | Wt 160.0 lb

## 2020-01-10 DIAGNOSIS — M5442 Lumbago with sciatica, left side: Secondary | ICD-10-CM

## 2020-01-10 DIAGNOSIS — Z7901 Long term (current) use of anticoagulants: Secondary | ICD-10-CM

## 2020-01-10 DIAGNOSIS — G8929 Other chronic pain: Secondary | ICD-10-CM | POA: Diagnosis not present

## 2020-01-10 DIAGNOSIS — M47816 Spondylosis without myelopathy or radiculopathy, lumbar region: Secondary | ICD-10-CM

## 2020-01-10 DIAGNOSIS — M5137 Other intervertebral disc degeneration, lumbosacral region: Secondary | ICD-10-CM | POA: Diagnosis not present

## 2020-01-10 DIAGNOSIS — M47817 Spondylosis without myelopathy or radiculopathy, lumbosacral region: Secondary | ICD-10-CM | POA: Diagnosis not present

## 2020-01-10 DIAGNOSIS — M5441 Lumbago with sciatica, right side: Secondary | ICD-10-CM | POA: Diagnosis not present

## 2020-01-10 DIAGNOSIS — M545 Low back pain: Secondary | ICD-10-CM

## 2020-01-10 DIAGNOSIS — M51379 Other intervertebral disc degeneration, lumbosacral region without mention of lumbar back pain or lower extremity pain: Secondary | ICD-10-CM

## 2020-01-10 MED ORDER — MIDAZOLAM HCL 5 MG/5ML IJ SOLN
1.0000 mg | INTRAMUSCULAR | Status: DC | PRN
  Administered 2020-01-10: 2 mg via INTRAVENOUS
  Filled 2020-01-10: qty 5

## 2020-01-10 MED ORDER — ROPIVACAINE HCL 2 MG/ML IJ SOLN
18.0000 mL | Freq: Once | INTRAMUSCULAR | Status: AC
  Administered 2020-01-10: 18 mL via PERINEURAL
  Filled 2020-01-10: qty 20

## 2020-01-10 MED ORDER — FENTANYL CITRATE (PF) 100 MCG/2ML IJ SOLN
25.0000 ug | INTRAMUSCULAR | Status: DC | PRN
  Administered 2020-01-10: 75 ug via INTRAVENOUS
  Filled 2020-01-10: qty 2

## 2020-01-10 MED ORDER — LIDOCAINE HCL 2 % IJ SOLN
20.0000 mL | Freq: Once | INTRAMUSCULAR | Status: AC
  Administered 2020-01-10: 400 mg
  Filled 2020-01-10: qty 40

## 2020-01-10 MED ORDER — LACTATED RINGERS IV SOLN
1000.0000 mL | Freq: Once | INTRAVENOUS | Status: AC
  Administered 2020-01-10: 1000 mL via INTRAVENOUS

## 2020-01-10 MED ORDER — TRIAMCINOLONE ACETONIDE 40 MG/ML IJ SUSP
80.0000 mg | Freq: Once | INTRAMUSCULAR | Status: AC
  Administered 2020-01-10: 80 mg
  Filled 2020-01-10: qty 2

## 2020-01-10 NOTE — Progress Notes (Signed)
Safety precautions to be maintained throughout the outpatient stay will include: orient to surroundings, keep bed in low position, maintain call bell within reach at all times, provide assistance with transfer out of bed and ambulation.  

## 2020-01-10 NOTE — Patient Instructions (Signed)

## 2020-01-10 NOTE — Progress Notes (Signed)
PROVIDER NOTE: Information contained herein reflects review and annotations entered in association with encounter. Interpretation of such information and data should be left to medically-trained personnel. Information provided to patient can be located elsewhere in the medical record under "Patient Instructions". Document created using STT-dictation technology, any transcriptional errors that may result from process are unintentional.    Patient: Kristin [name]  Service Category: Procedure  Provider: Gaspar Cola, MD  DOB: 1930/02/23  DOS: 01/10/2020  Location: [facility]  MRN: [id number]  Setting: Ambulatory - outpatient  Referring Provider: [name] *  Type: Established Patient  Specialty: Interventional Pain Management  PCP: Olin Hauser, DO   Primary Reason for Visit: Interventional Pain Management Treatment. CC: Back Pain (lower)  Procedure:          Anesthesia, Analgesia, Anxiolysis:  Type: Lumbar Facet, Medial Branch Block(s)          Primary Purpose: Palliative Region: Posterolateral Lumbosacral Spine Level: L2, L3, L4, L5, & S1 Medial Branch Level(s). Injecting these levels blocks the L3-4, L4-5, and L5-S1 lumbar facet joints. Laterality: Bilateral  Type: Moderate (Conscious) Sedation combined with Local Anesthesia Indication(s): Analgesia and Anxiety Route: Intravenous (IV) IV Access: Secured Sedation: Meaningful verbal contact was maintained at all times during the procedure  Local Anesthetic: Lidocaine 1-2%  Position: Prone   Indications: 1. Lumbar facet syndrome (Bilateral) (L>R)   2. Lumbar facet hypertrophy (L1-2, L2-3, and L4-5)   3. Spondylosis without myelopathy or radiculopathy, lumbosacral region   4. Chronic low back pain (Primary Area of Pain) (Bilateral) (L>R)   5. DDD (degenerative disc disease), lumbosacral   6. Chronic anticoagulation (Eliquis)    Pain Score: Pre-procedure: 8 /10 Post-procedure: 0-No pain/10    Pre-op Assessment:  Kristin [name] is a 86 y.o. (year old), female patient, seen today for interventional treatment. She  has a past surgical history that includes Abdominal hysterectomy; Back surgery; Rotator cuff repair; Breast surgery; Cataract extraction w/PHACO (10/19/2011); Cataract extraction w/PHACO (11/02/2011); Carpal tunnel release; Joint replacement; Colon surgery; Pacemaker insertion (N/A, 12/24/2015); Cholecystectomy; Breast lumpectomy (Right, 2004); and Insert / replace / remove pacemaker. Kristin [name] has a current medication list which includes the following prescription(s): acetaminophen, albuterol, apixaban, bisacodyl, calcium carbonate-vitamin d, vitamin d, furosemide, levocetirizine, lisinopril, melatonin, oxycodone hcl, [START ON 01/28/2020] oxycodone hcl, [START ON 02/27/2020] oxycodone hcl, oxygen-helium, polyethylene glycol, ropinirole, [START ON 01/28/2020] ropinirole, and benefiber, and the following Facility-Administered Medications: fentanyl and midazolam. Her primarily concern today is the Back Pain (lower)  Initial Vital Signs:  Pulse/HCG Rate: 71ECG Heart Rate: 61 Temp: 97.9 F (36.6 C) Resp: 16 BP: (!) 149/89 SpO2: 97 %  BMI: Estimated body mass index is 25.82 kg/m as calculated from the following:   Height as of this encounter: 5\' 6"  (1.676 m).   Weight as of this encounter: 160 lb (72.6 kg).  Risk Assessment: Allergies: Reviewed. She is allergic to flexeril [cyclobenzaprine hcl] and vancomycin.  Allergy Precautions: None required Coagulopathies: Reviewed. None identified.  Blood-thinner therapy: None at this time Active Infection(s): Reviewed. None identified. Kristin [name] is afebrile  Site Confirmation: Kristin [name] was asked to confirm the procedure and laterality before marking the site Procedure checklist: Completed Consent: Before the procedure and under the influence of no sedative(s), amnesic(s), or anxiolytics, the patient was informed of the treatment options,  risks and possible complications. To fulfill our ethical and legal obligations, as recommended by the American Medical Association's Code of Ethics, I have informed the patient of my clinical  impression; the nature and purpose of the treatment or procedure; the risks, benefits, and possible complications of the intervention; the alternatives, including doing nothing; the risk(s) and benefit(s) of the alternative treatment(s) or procedure(s); and the risk(s) and benefit(s) of doing nothing. The patient was provided information about the general risks and possible complications associated with the procedure. These may include, but are not limited to: failure to achieve desired goals, infection, bleeding, organ or nerve damage, allergic reactions, paralysis, and death. In addition, the patient was informed of those risks and complications associated to Spine-related procedures, such as failure to decrease pain; infection (i.e.: Meningitis, epidural or intraspinal abscess); bleeding (i.e.: epidural hematoma, subarachnoid hemorrhage, or any other type of intraspinal or peri-dural bleeding); organ or nerve damage (i.e.: Any type of peripheral nerve, nerve root, or spinal cord injury) with subsequent damage to sensory, motor, and/or autonomic systems, resulting in permanent pain, numbness, and/or weakness of one or several areas of the body; allergic reactions; (i.e.: anaphylactic reaction); and/or death. Furthermore, the patient was informed of those risks and complications associated with the medications. These include, but are not limited to: allergic reactions (i.e.: anaphylactic or anaphylactoid reaction(s)); adrenal axis suppression; blood sugar elevation that in diabetics may result in ketoacidosis or comma; water retention that in patients with history of congestive heart failure may result in shortness of breath, pulmonary edema, and decompensation with resultant heart failure; weight gain; swelling or edema;  medication-induced neural toxicity; particulate matter embolism and blood vessel occlusion with resultant organ, and/or nervous system infarction; and/or aseptic necrosis of one or more joints. Finally, the patient was informed that Medicine is not an exact science; therefore, there is also the possibility of unforeseen or unpredictable risks and/or possible complications that may result in a catastrophic outcome. The patient indicated having understood very clearly. We have given the patient no guarantees and we have made no promises. Enough time was given to the patient to ask questions, all of which were answered to the patient's satisfaction. Ms. Suder has indicated that she wanted to continue with the procedure. Attestation: I, the ordering provider, attest that I have discussed with the patient the benefits, risks, side-effects, alternatives, likelihood of achieving goals, and potential problems during recovery for the procedure that I have provided informed consent. Date  Time: 01/10/2020  9:10 AM  Pre-Procedure Preparation:  Monitoring: As per clinic protocol. Respiration, ETCO2, SpO2, BP, heart rate and rhythm monitor placed and checked for adequate function Safety Precautions: Patient was assessed for positional comfort and pressure points before starting the procedure. Time-out: I initiated and conducted the "Time-out" before starting the procedure, as per protocol. The patient was asked to participate by confirming the accuracy of the "Time Out" information. Verification of the correct person, site, and procedure were performed and confirmed by me, the nursing staff, and the patient. "Time-out" conducted as per Joint Commission's Universal Protocol (UP.01.01.01). Time: 0945  Description of Procedure:          Laterality: Bilateral. The procedure was performed in identical fashion on both sides. Levels:  L2, L3, L4, L5, & S1 Medial Branch Level(s) Area Prepped: Posterior Lumbosacral  Region DuraPrep (Iodine Povacrylex [0.7% available iodine] and Isopropyl Alcohol, 74% w/w) Safety Precautions: Aspiration looking for blood return was conducted prior to all injections. At no point did we inject any substances, as a needle was being advanced. Before injecting, the patient was told to immediately notify me if she was experiencing any new onset of "ringing in the ears, or  metallic taste in the mouth". No attempts were made at seeking any paresthesias. Safe injection practices and needle disposal techniques used. Medications properly checked for expiration dates. SDV (single dose vial) medications used. After the completion of the procedure, all disposable equipment used was discarded in the proper designated medical waste containers. Local Anesthesia: Protocol guidelines were followed. The patient was positioned over the fluoroscopy table. The area was prepped in the usual manner. The time-out was completed. The target area was identified using fluoroscopy. A 12-in long, straight, sterile hemostat was used with fluoroscopic guidance to locate the targets for each level blocked. Once located, the skin was marked with an approved surgical skin marker. Once all sites were marked, the skin (epidermis, dermis, and hypodermis), as well as deeper tissues (fat, connective tissue and muscle) were infiltrated with a small amount of a short-acting local anesthetic, loaded on a 10cc syringe with a 25G, 1.5-in  Needle. An appropriate amount of time was allowed for local anesthetics to take effect before proceeding to the next step. Local Anesthetic: Lidocaine 2.0% The unused portion of the local anesthetic was discarded in the proper designated containers. Technical explanation of process:  L2 Medial Branch Nerve Block (MBB): The target area for the L2 medial branch is at the junction of the postero-lateral aspect of the superior articular process and the superior, posterior, and medial edge of the  transverse process of L3. Under fluoroscopic guidance, a Quincke needle was inserted until contact was made with os over the superior postero-lateral aspect of the pedicular shadow (target area). After negative aspiration for blood, 0.5 mL of the nerve block solution was injected without difficulty or complication. The needle was removed intact. L3 Medial Branch Nerve Block (MBB): The target area for the L3 medial branch is at the junction of the postero-lateral aspect of the superior articular process and the superior, posterior, and medial edge of the transverse process of L4. Under fluoroscopic guidance, a Quincke needle was inserted until contact was made with os over the superior postero-lateral aspect of the pedicular shadow (target area). After negative aspiration for blood, 0.5 mL of the nerve block solution was injected without difficulty or complication. The needle was removed intact. L4 Medial Branch Nerve Block (MBB): The target area for the L4 medial branch is at the junction of the postero-lateral aspect of the superior articular process and the superior, posterior, and medial edge of the transverse process of L5. Under fluoroscopic guidance, a Quincke needle was inserted until contact was made with os over the superior postero-lateral aspect of the pedicular shadow (target area). After negative aspiration for blood, 0.5 mL of the nerve block solution was injected without difficulty or complication. The needle was removed intact. L5 Medial Branch Nerve Block (MBB): The target area for the L5 medial branch is at the junction of the postero-lateral aspect of the superior articular process and the superior, posterior, and medial edge of the sacral ala. Under fluoroscopic guidance, a Quincke needle was inserted until contact was made with os over the superior postero-lateral aspect of the pedicular shadow (target area). After negative aspiration for blood, 0.5 mL of the nerve block solution was injected  without difficulty or complication. The needle was removed intact. S1 Medial Branch Nerve Block (MBB): The target area for the S1 medial branch is at the posterior and inferior 6 o'clock position of the L5-S1 facet joint. Under fluoroscopic guidance, the Quincke needle inserted for the L5 MBB was redirected until contact was made with os  over the inferior and postero aspect of the sacrum, at the 6 o' clock position under the L5-S1 facet joint (Target area). After negative aspiration for blood, 0.5 mL of the nerve block solution was injected without difficulty or complication. The needle was removed intact.  Nerve block solution: 0.2% PF-Ropivacaine + Triamcinolone (40 mg/mL) diluted to a final concentration of 4 mg of Triamcinolone/mL of Ropivacaine The unused portion of the solution was discarded in the proper designated containers. Procedural Needles: 22-gauge, 3.5-inch, Quincke needles used for all levels.  Once the entire procedure was completed, the treated area was cleaned, making sure to leave some of the prepping solution back to take advantage of its long term bactericidal properties.   Illustration of the posterior view of the lumbar spine and the posterior neural structures. Laminae of L2 through S1 are labeled. DPRL5, dorsal primary ramus of L5; DPRS1, dorsal primary ramus of S1; DPR3, dorsal primary ramus of L3; FJ, facet (zygapophyseal) joint L3-L4; I, inferior articular process of L4; LB1, lateral branch of dorsal primary ramus of L1; IAB, inferior articular branches from L3 medial branch (supplies L4-L5 facet joint); IBP, intermediate branch plexus; MB3, medial branch of dorsal primary ramus of L3; NR3, third lumbar nerve root; S, superior articular process of L5; SAB, superior articular branches from L4 (supplies L4-5 facet joint also); TP3, transverse process of L3.  Vitals:   01/10/20 0954 01/10/20 1004 01/10/20 1014 01/10/20 1024  BP: 105/69 129/71 127/75 137/80  Pulse:      Resp:  12 16 14 14   Temp:  97.8 F (36.6 C)    TempSrc:      SpO2: 100% 95% 96% 100%  Weight:      Height:         Start Time: 0945 hrs. End Time: 0954 hrs.  Imaging Guidance (Spinal):          Type of Imaging Technique: Fluoroscopy Guidance (Spinal) Indication(s): Assistance in needle guidance and placement for procedures requiring needle placement in or near specific anatomical locations not easily accessible without such assistance. Exposure Time: Please see nurses notes. Contrast: None used. Fluoroscopic Guidance: I was personally present during the use of fluoroscopy. "Tunnel Vision Technique" used to obtain the best possible view of the target area. Parallax error corrected before commencing the procedure. "Direction-depth-direction" technique used to introduce the needle under continuous pulsed fluoroscopy. Once target was reached, antero-posterior, oblique, and lateral fluoroscopic projection used confirm needle placement in all planes. Images permanently stored in EMR. Interpretation: No contrast injected. I personally interpreted the imaging intraoperatively. Adequate needle placement confirmed in multiple planes. Permanent images saved into the patient's record.  Antibiotic Prophylaxis:   Anti-infectives (From admission, onward)   None     Indication(s): None identified  Post-operative Assessment:  Post-procedure Vital Signs:  Pulse/HCG Rate: 7160 Temp: 97.8 F (36.6 C) Resp: 14 BP: 137/80 SpO2: 100 %  EBL: None  Complications: No immediate post-treatment complications observed by team, or reported by patient.  Note: The patient tolerated the entire procedure well. A repeat set of vitals were taken after the procedure and the patient was kept under observation following institutional policy, for this type of procedure. Post-procedural neurological assessment was performed, showing return to baseline, prior to discharge. The patient was provided with post-procedure  discharge instructions, including a section on how to identify potential problems. Should any problems arise concerning this procedure, the patient was given instructions to immediately contact us, at any time, without hesitation. In any case, we plan  to contact the patient by telephone for a follow-up status report regarding this interventional procedure.  Comments:  No additional relevant information.  Plan of Care  Orders:  Orders Placed This Encounter  Procedures  . LUMBAR FACET(MEDIAL BRANCH NERVE BLOCK) MBNB    Scheduling Instructions:     Procedure: Lumbar facet block (AKA.: Lumbosacral medial branch nerve block)     Side: Bilateral     Level: L3-4, L4-5, & L5-S1 Facets (L2, L3, L4, L5, & S1 Medial Branch Nerves)     Sedation: Patient's choice.     Timeframe: Today    Order Specific Question:   Where will this procedure be performed?    Answer:   [facility]  . [facility] C-ARM 1-60 [name] NO REPORT    Intraoperative interpretation by procedural physician at [place].    Standing Status:   Standing    Number of Occurrences:   1    Order Specific Question:   Reason for exam:    Answer:   Assistance in needle guidance and placement for procedures requiring needle placement in or near specific anatomical locations not easily accessible without such assistance.  . Informed Consent Details: Physician/Practitioner Attestation; Transcribe to consent form and obtain patient signature    Nursing Order: Transcribe to consent form and obtain patient signature. Note: Always confirm laterality of pain with Ms. Winstead, before procedure. Procedure: Lumbar Facet Block  under fluoroscopic guidance Indication/Reason: Low Back Pain, with our without leg pain, due to Facet Joint Arthralgia (Joint Pain) known as Lumbar Facet Syndrome, secondary to Lumbar, and/or Lumbosacral Spondylosis (Arthritis of the Spine), without myelopathy or radiculopathy (Nerve Damage). Provider  Attestation: I, [place] Dossie Arbour, MD, (Pain Management Specialist), the physician/practitioner, attest that I have discussed with the patient the benefits, risks, side effects, alternatives, likelihood of achieving goals and potential problems during recovery for the procedure that I have provided informed consent.  . Care order/instruction: Please confirm that the patient has stopped the Eliquis (Apixaban) x 3 days prior to procedure or surgery.    Please confirm that the patient has stopped the Eliquis (Apixaban) x 3 days prior to procedure or surgery.    Standing Status:   Standing    Number of Occurrences:   1  . Provide equipment / supplies at bedside    Equipment required: Single use, disposable, "Block Tray"    Standing Status:   Standing    Number of Occurrences:   1    Order Specific Question:   Specify    Answer:   Block Tray  . Bleeding precautions    Standing Status:   Standing    Number of Occurrences:   1   Chronic Opioid Analgesic:  Oxycodone IR 20 mg, 1 tab PO q 8 hrs (60 mg/day of oxycodone) MME/day: 90 mg/day.   Medications ordered for procedure: Meds ordered this encounter  Medications  . lidocaine (XYLOCAINE) 2 % (with pres) injection 400 mg  . lactated ringers infusion 1,000 mL  . midazolam (VERSED) 5 MG/5ML injection 1-2 mg    Make sure Flumazenil is available in the pyxis when using this medication. If oversedation occurs, administer 0.2 mg IV over 15 sec. If after 45 sec no response, administer 0.2 mg again over 1 [name]; may repeat at 1 [name] intervals; not to exceed 4 doses (1 mg)  . fentaNYL (SUBLIMAZE) injection 25-50 mcg    Make sure Narcan is available in the pyxis when using this medication. In  the event of respiratory depression (RR< 8/[name]): Titrate NARCAN (naloxone) in increments of 0.1 to 0.2 mg IV at 2-3 minute intervals, until desired degree of reversal.  . ropivacaine (PF) 2 mg/mL (0.2%) (NAROPIN) injection 18 mL  . triamcinolone acetonide (KENALOG-40)  injection 80 mg   Medications administered: We administered lidocaine, lactated ringers, midazolam, fentaNYL, ropivacaine (PF) 2 mg/mL (0.2%), and triamcinolone acetonide.  See the medical record for exact dosing, route, and time of administration.  Follow-up plan:   Return in about 2 weeks (around 01/24/2020) for (VV), (PP).       Interventional management options:  Considering:   NOTE: ELIQUIS ANTICOAGULATION (Stop: x3 days prior to procedure  Restart 6 hours after procedures) Diagnostic right SI joint block Possible bilateral SI joint RFA Diagnostic bilateral femoral nerve + obturator NB Possible bilateral femoral nerve + obturator nerve RFA Diagnostic right IA shoulder joint injection   Palliative PRN treatment(s):   Diagnostic left SI joint block #2  Palliative left CESI #3  Palliative left trochanteric bursa injection #2  Palliative right IA hip inj. #2  (100% on the right) Palliative left IA hip inj. #3   Palliative right lumbar facet block #3 Palliative left lumbar facet block #5 Palliative right lumbar facet RFA #2 (Last done on 09/19/18) Palliative left lumbar facet RFA #4 (Last done on 08/01/18) Palliative bilateral suprascapular nerve block Palliative bilateral suprascapular nerve RFA #2(Last done 07/13/18)      Recent Visits Date Type Provider Dept  12/20/19 Telemedicine Milinda Pointer, MD [facility]  Showing recent visits within past 90 days and meeting all other requirements   Today's Visits Date Type Provider Dept  01/10/20 Procedure visit Milinda Pointer, MD [facility]  Showing today's visits and meeting all other requirements   Future Appointments Date Type Provider Dept  01/23/20 Appointment Milinda Pointer, MD [facility]  03/17/20 Appointment Milinda Pointer, MD [facility]  Showing future appointments within next 90 days and meeting all other requirements   Disposition: Discharge home   Discharge (Date  Time): 01/10/2020; 1028 hrs.   Primary Care Physician: Olin Hauser, DO Location: [facility] Note by: Gaspar Cola, MD Date: 01/10/2020; Time: 12:19 PM  Disclaimer:  Medicine is not an Chief Strategy Officer. The only guarantee in medicine is that nothing is guaranteed. It is important to note that the decision to proceed with this intervention was based on the information collected from the patient. The Data and conclusions were drawn from the patient's questionnaire, the interview, and the physical examination. Because the information was provided in large part by the patient, it cannot be guaranteed that it has not been purposely or unconsciously manipulated. Every effort has been made to obtain as much relevant data as possible for this evaluation. It is important to note that the conclusions that lead to this procedure are derived in large part from the available data. Always take into account that the treatment will also be dependent on availability of resources and existing treatment guidelines, considered by other Pain Management Practitioners as being common knowledge and practice, at the time of the intervention. For Medico-Legal purposes, it is also important to point out that variation in procedural techniques and pharmacological choices are the acceptable norm. The indications, contraindications, technique, and results of the above procedure should only be interpreted and judged by a Board-Certified Interventional Pain Specialist with extensive familiarity and expertise in the same exact procedure and technique.

## 2020-01-11 ENCOUNTER — Telehealth: Payer: Self-pay

## 2020-01-11 NOTE — Telephone Encounter (Signed)
Post procedure phone call.  Patient states she does not see that the injection helped at all.  Instructed to put heat on it today and to document on her pain diary  So that she can accurately tell Dr Dossie Arbour at her follow up appointment.

## 2020-01-15 DIAGNOSIS — M2042 Other hammer toe(s) (acquired), left foot: Secondary | ICD-10-CM | POA: Diagnosis not present

## 2020-01-15 DIAGNOSIS — B351 Tinea unguium: Secondary | ICD-10-CM | POA: Diagnosis not present

## 2020-01-15 DIAGNOSIS — M79675 Pain in left toe(s): Secondary | ICD-10-CM | POA: Diagnosis not present

## 2020-01-15 LAB — MISC LABCORP TEST (SEND OUT): Labcorp test code: 738526

## 2020-01-23 ENCOUNTER — Other Ambulatory Visit: Payer: Self-pay

## 2020-01-23 ENCOUNTER — Ambulatory Visit: Payer: Medicare Other | Attending: Pain Medicine | Admitting: Pain Medicine

## 2020-01-23 DIAGNOSIS — G894 Chronic pain syndrome: Secondary | ICD-10-CM | POA: Diagnosis not present

## 2020-01-23 DIAGNOSIS — G8929 Other chronic pain: Secondary | ICD-10-CM

## 2020-01-23 DIAGNOSIS — M5442 Lumbago with sciatica, left side: Secondary | ICD-10-CM

## 2020-01-23 DIAGNOSIS — M5412 Radiculopathy, cervical region: Secondary | ICD-10-CM

## 2020-01-23 DIAGNOSIS — R937 Abnormal findings on diagnostic imaging of other parts of musculoskeletal system: Secondary | ICD-10-CM | POA: Diagnosis not present

## 2020-01-23 DIAGNOSIS — M79604 Pain in right leg: Secondary | ICD-10-CM

## 2020-01-23 DIAGNOSIS — M5441 Lumbago with sciatica, right side: Secondary | ICD-10-CM

## 2020-01-23 DIAGNOSIS — Z7901 Long term (current) use of anticoagulants: Secondary | ICD-10-CM

## 2020-01-23 DIAGNOSIS — M48061 Spinal stenosis, lumbar region without neurogenic claudication: Secondary | ICD-10-CM

## 2020-01-23 DIAGNOSIS — M5137 Other intervertebral disc degeneration, lumbosacral region: Secondary | ICD-10-CM | POA: Diagnosis not present

## 2020-01-23 DIAGNOSIS — M79605 Pain in left leg: Secondary | ICD-10-CM

## 2020-01-23 NOTE — Patient Instructions (Signed)
____________________________________________________________________________________________  Preparing for Procedure with Sedation  Procedure appointments are limited to planned procedures: . No Prescription Refills. . No disability issues will be discussed. . No medication changes will be discussed.  Instructions: . Oral Intake: Do not eat or drink anything for at least 8 hours prior to your procedure. (Exception: Blood Pressure Medication. See below.) . Transportation: Unless otherwise stated by your physician, you may drive yourself after the procedure. . Blood Pressure Medicine: Do not forget to take your blood pressure medicine with a sip of water the morning of the procedure. If your Diastolic (lower reading)is above 100 mmHg, elective cases will be cancelled/rescheduled. . Blood thinners: These will need to be stopped for procedures. Notify our staff if you are taking any blood thinners. Depending on which one you take, there will be specific instructions on how and when to stop it. . Diabetics on insulin: Notify the staff so that you can be scheduled 1st case in the morning. If your diabetes requires high dose insulin, take only  of your normal insulin dose the morning of the procedure and notify the staff that you have done so. . Preventing infections: Shower with an antibacterial soap the morning of your procedure. . Build-up your immune system: Take 1000 mg of Vitamin C with every meal (3 times a day) the day prior to your procedure. Marland Kitchen Antibiotics: Inform the staff if you have a condition or reason that requires you to take antibiotics before dental procedures. . Pregnancy: If you are pregnant, call and cancel the procedure. . Sickness: If you have a cold, fever, or any active infections, call and cancel the procedure. . Arrival: You must be in the facility at least 30 minutes prior to your scheduled procedure. . Children: Do not bring children with you. . Dress appropriately:  Bring dark clothing that you would not mind if they get stained. . Valuables: Do not bring any jewelry or valuables.  Reasons to call and reschedule or cancel your procedure: (Following these recommendations will minimize the risk of a serious complication.) . Surgeries: Avoid having procedures within 2 weeks of any surgery. (Avoid for 2 weeks before or after any surgery). . Flu Shots: Avoid having procedures within 2 weeks of a flu shots or . (Avoid for 2 weeks before or after immunizations). . Barium: Avoid having a procedure within 7-10 days after having had a radiological study involving the use of radiological contrast. (Myelograms, Barium swallow or enema study). . Heart attacks: Avoid any elective procedures or surgeries for the initial 6 months after a "Myocardial Infarction" (Heart Attack). . Blood thinners: It is imperative that you stop these medications before procedures. Let us know if you if you take any blood thinner.  . Infection: Avoid procedures during or within two weeks of an infection (including chest colds or gastrointestinal problems). Symptoms associated with infections include: Localized redness, fever, chills, night sweats or profuse sweating, burning sensation when voiding, cough, congestion, stuffiness, runny nose, sore throat, diarrhea, nausea, vomiting, cold or Flu symptoms, recent or current infections. It is specially important if the infection is over the area that we intend to treat. Marland Kitchen Heart and lung problems: Symptoms that may suggest an active cardiopulmonary problem include: cough, chest pain, breathing difficulties or shortness of breath, dizziness, ankle swelling, uncontrolled high or unusually low blood pressure, and/or palpitations. If you are experiencing any of these symptoms, cancel your procedure and contact your primary care physician for an evaluation.  Remember:  Regular Business hours are:  Monday to Thursday 8:00 AM to 4:00 PM  Provider's  Schedule: Milinda Pointer, MD:  Procedure days: Tuesday and Thursday 7:30 AM to 4:00 PM  Gillis Santa, MD:  Procedure days: Monday and Wednesday 7:30 AM to 4:00 PM ____________________________________________________________________________________________   ____________________________________________________________________________________________  Blood Thinners  IMPORTANT NOTICE:  If you take any of these, make sure to notify the nursing staff.  Failure to do so may result in injury.  Recommended time intervals to stop and restart blood-thinners, before & after invasive procedures  Generic Name Brand Name Stop Time. Must be stopped at least this long before procedures. After procedures, wait at least this long before re-starting.  Abciximab Reopro 15 days 2 hrs  Alteplase Activase 10 days 10 days  Anagrelide Agrylin    Apixaban Eliquis 3 days 6 hrs  Cilostazol Pletal 3 days 5 hrs  Clopidogrel Plavix 7-10 days 2 hrs  Dabigatran Pradaxa 5 days 6 hrs  Dalteparin Fragmin 24 hours 4 hrs  Dipyridamole Aggrenox 11days 2 hrs  Edoxaban Lixiana; Savaysa 3 days 2 hrs  Enoxaparin  Lovenox 24 hours 4 hrs  Eptifibatide Integrillin 8 hours 2 hrs  Fondaparinux  Arixtra 72 hours 12 hrs  Prasugrel Effient 7-10 days 6 hrs  Reteplase Retavase 10 days 10 days  Rivaroxaban Xarelto 3 days 6 hrs  Ticagrelor Brilinta 5-7 days 6 hrs  Ticlopidine Ticlid 10-14 days 2 hrs  Tinzaparin Innohep 24 hours 4 hrs  Tirofiban Aggrastat 8 hours 2 hrs  Warfarin Coumadin 5 days 2 hrs   Other medications with blood-thinning effects  Product indications Generic (Brand) names Note  Cholesterol Lipitor Stop 4 days before procedure  Blood thinner (injectable) Heparin (LMW or LMWH Heparin) Stop 24 hours before procedure  Cancer Ibrutinib (Imbruvica) Stop 7 days before procedure  Malaria/Rheumatoid Hydroxychloroquine (Plaquenil) Stop 11 days before procedure  Thrombolytics  10 days before or after procedures    Over-the-counter (OTC) Products with blood-thinning effects  Product Common names Stop Time  Aspirin > 325 mg Goody Powders, Excedrin, etc. 11 days  Aspirin ? 81 mg  7 days  Fish oil  4 days  Garlic supplements  7 days  Ginkgo biloba  36 hours  Ginseng  24 hours  NSAIDs Ibuprofen, Naprosyn, etc. 3 days  Vitamin E  4 days   ____________________________________________________________________________________________

## 2020-01-23 NOTE — Progress Notes (Signed)
Patient: Kristin [name]  Service Category: E/M  Provider: Gaspar Cola, MD  DOB: 1930/01/28  DOS: 01/23/2020  Location: Office  MRN: [id number]  Setting: Ambulatory outpatient  Referring Provider: [name] *  Type: Established Patient  Specialty: Interventional Pain Management  PCP: Olin Hauser, DO  Location: Remote location  Delivery: TeleHealth     Virtual Encounter - Pain Management PROVIDER NOTE: Information contained herein reflects review and annotations entered in association with encounter. Interpretation of such information and data should be left to medically-trained personnel. Information provided to patient can be located elsewhere in the medical record under "Patient Instructions". Document created using STT-dictation technology, any transcriptional errors that may result from process are unintentional.    Contact & Pharmacy Preferred: [phone] Home: [phone] (home) Mobile: There is no such number on file (mobile). E-mail: [email or web address]  [place], [place] - [address] [place] [address] Phone: [phone] Fax: [phone]   Pre-screening  Kristin [name] offered "in-person" vs "virtual" encounter. She indicated preferring virtual for this encounter.   Reason COVID-19*   Social distancing based on CDC and AMA recommendations.   I contacted Kristin [name] on 01/23/2020 via telephone.      I clearly identified myself as Gaspar Cola, MD. I verified that I was speaking with the correct person using two identifiers (Name: Kristin [name], and date of birth: [birth year]).  Consent I sought verbal advanced consent from Kristin [name] for virtual visit interactions. I informed Kristin [name] of possible security and privacy concerns, risks, and limitations associated with providing "not-in-person" medical evaluation and management services. I also informed Kristin [name] of the availability of  "in-person" appointments. Finally, I informed her that there would be a charge for the virtual visit and that she could be  personally, fully or partially, financially responsible for it. Kristin [name] expressed understanding and agreed to proceed.   Historic Elements   Kristin [name] is a 86 y.o. year old, female patient evaluated today after her last contact with our practice on 01/11/2020. Kristin [name]  has a past medical history of Acute postoperative pain (02/09/2018), Arrhythmia, sinus node (04/03/2014), Arthritis, Arthritis, Atrial fibrillation ([place]), Back pain, Bradycardia, Breast cancer ([place]) (2004), Cancer ([place]) (2004), CHF (congestive heart failure) ([place]), Chronic back pain, CKD (chronic kidney disease), COPD (chronic obstructive pulmonary disease) ([place]), Cystocele, Decubitus ulcers, Dehydration, Gout, Hematuria, Hepatitis C, Hyperlipidemia, Hypertension, Hypomagnesemia (06/25/2015), OAB (overactive bladder), Overactive bladder, Restless leg, Shoulder pain, bilateral, Urinary frequency, UTI (lower urinary tract infection), and Vaginal atrophy. She also  has a past surgical history that includes Abdominal hysterectomy; Back surgery; Rotator cuff repair; Breast surgery; Cataract extraction w/PHACO (10/19/2011); Cataract extraction w/PHACO (11/02/2011); Carpal tunnel release; Joint replacement; Colon surgery; Pacemaker insertion (N/A, 12/24/2015); Cholecystectomy; Breast lumpectomy (Right, 2004); and Insert / replace / remove pacemaker. Kristin [name] has a current medication list which includes the following prescription(s): acetaminophen, albuterol, apixaban, bisacodyl, calcium carbonate-vitamin d, vitamin d, furosemide, levocetirizine, lisinopril, melatonin, oxycodone hcl, [START ON 01/28/2020] oxycodone hcl, [START ON 02/27/2020] oxycodone hcl, oxygen-helium, polyethylene glycol, [START ON 01/28/2020] ropinirole, and benefiber. She  reports that she quit smoking about 21 years ago. Her smoking use included cigarettes.  She has a 50.00 pack-year smoking history. She has quit using smokeless tobacco. She reports that she does not drink alcohol or use drugs. Kristin [name] is allergic to flexeril [cyclobenzaprine hcl] and vancomycin.   HPI  Today, she is being contacted for a  post-procedure assessment.  The patient indicated that the diagnostic bilateral lumbar facet block did not provide her with any relief of the pain.  This in communication with the results of her recent MRI would suggest that the pain is actually coming from the upper lumbar region and the high-grade foraminal stenosis that she has at the L2-3 and L4-5 levels.  Because of this, we will bring the patient in for transforaminal epidural steroid injections.  The plan was shared with the patient who understood and agreed.  Post-Procedure Evaluation  Procedure: Palliative bilateral lumbar facet block under fluoroscopic guidance and IV sedation Pre-procedure pain level: 8/10 Post-procedure: 0/10 (100% relief)  Sedation: Sedation provided.  Effectiveness during initial hour after procedure(Ultra-Short Term Relief): 0 % .  Local anesthetic used: Long-acting (4-6 hours) Effectiveness: Defined as any analgesic benefit obtained secondary to the administration of local anesthetics. This carries significant diagnostic value as to the etiological location, or anatomical origin, of the pain. Duration of benefit is expected to coincide with the duration of the local anesthetic used.  Effectiveness during initial 4-6 hours after procedure(Short-Term Relief): 0 % .  Long-term benefit: Defined as any relief past the pharmacologic duration of the local anesthetics.  Effectiveness past the initial 6 hours after procedure(Long-Term Relief): 0 % .   Pharmacotherapy Assessment  Analgesic: Oxycodone IR 20 mg, 1 tab PO q 8 hrs (60 mg/day of oxycodone) MME/day: 90 mg/day.   Monitoring: [place] PMP: PDMP reviewed during this encounter.       Pharmacotherapy: No side-effects  or adverse reactions reported. Compliance: No problems identified. Effectiveness: Clinically acceptable. Plan: Refer to "POC".  UDS:  Summary  Date Value Ref Range Status  02/01/2018 FINAL  Final    Comment:    ==================================================================== TOXASSURE SELECT 13 (MW) ==================================================================== Test                             Result       Flag       Units Drug Present and Declared for Prescription Verification   Oxycodone                      401          EXPECTED   ng/mg creat   Oxymorphone                    438          EXPECTED   ng/mg creat   Noroxycodone                   1122         EXPECTED   ng/mg creat   Noroxymorphone                 309          EXPECTED   ng/mg creat    Sources of oxycodone are scheduled prescription medications.    Oxymorphone, noroxycodone, and noroxymorphone are expected    metabolites of oxycodone. Oxymorphone is also available as a    scheduled prescription medication. ==================================================================== Test                      Result    Flag   Units      Ref Range   Creatinine              79  mg/dL      >=20 ==================================================================== Declared Medications:  The flagging and interpretation on this report are based on the  following declared medications.  Unexpected results may arise from  inaccuracies in the declared medications.  **Note: The testing scope of this panel includes these medications:  Oxycodone  **Note: The testing scope of this panel does not include following  reported medications:  Albuterol (Proventil)  Apixaban  Bisacodyl  Furosemide (Lasix)  Lisinopril  Melatonin  Polyethylene Glycol (MiraLAX)  Ropinirole  Vitamin D ==================================================================== For clinical consultation, please call [phone]. ====================================================================     Laboratory Chemistry Profile   Renal Lab Results  Component Value Date   BUN 27 (H) 05/08/2019   CREATININE 1.22 (H) 05/08/2019   BCR 22 05/08/2019   GFRAA 46 (L) 05/08/2019   GFRNONAA 40 (L) 05/08/2019     Hepatic Lab Results  Component Value Date   AST 21 07/25/2018   ALT 11 07/25/2018   ALBUMIN 3.6 08/22/2017   ALKPHOS 60 08/22/2017     Electrolytes Lab Results  Component Value Date   NA 137 05/08/2019   K 5.0 05/08/2019   CL 109 05/08/2019   CALCIUM 10.1 05/08/2019   MG 2.2 09/24/2015     Bone No results found for: VD25OH, VD125OH2TOT, KD3267TI4, PY0998PJ8, 25OHVITD1, 25OHVITD2, 25OHVITD3, TESTOFREE, TESTOSTERONE   Inflammation (CRP: Acute Phase) (ESR: Chronic Phase) Lab Results  Component Value Date   CRP <0.5 09/24/2015   ESRSEDRATE 12 09/24/2015       Note: Above Lab results reviewed.   Imaging  DG PAIN CLINIC C-ARM 1-60 MIN NO REPORT Fluoro was used, but no Radiologist interpretation will be provided.  Please refer to "NOTES" tab for provider progress note.  Assessment  The primary encounter diagnosis was Chronic pain syndrome. Diagnoses of Chronic low back pain (Primary Area of Pain) (Bilateral) (L>R), Chronic lower extremity pain (Secondary Area of Pain) (Bilateral) (L>R), Chronic cervical radicular pain (Tertiary Area of Pain) (Bilateral) (L>R), DDD (degenerative disc disease), lumbosacral, Lumbar lateral recess stenosis (L1-2 and L4-5), Lumbar foraminal stenosis (Severe Left L4-5), Chronic anticoagulation (Eliquis), and Abnormal MRI, lumbar spine (11/03/2019) were also pertinent to this visit.  Plan of Care  Problem-specific:  No problem-specific Assessment & Plan notes found for this encounter.  Kristin [name] has a current medication list which includes the following long-term medication(s): albuterol, apixaban, bisacodyl, furosemide, levocetirizine,  lisinopril, melatonin, oxycodone hcl, [START ON 01/28/2020] oxycodone hcl, [START ON 02/27/2020] oxycodone hcl, polyethylene glycol, [START ON 01/28/2020] ropinirole, and benefiber.  Pharmacotherapy (Medications Ordered): No orders of the defined types were placed in this encounter.  Orders:  Orders Placed This Encounter  Procedures   Lumbar Transforaminal Epidural    Standing Status:   Future    Standing Expiration Date:   02/22/2020    Scheduling Instructions:     Side: Right-sided L2 & Left L4     Level: L2 & L4     Sedation: With Sedation.     Timeframe: ASAP    Order Specific Question:   Where will this procedure be performed?    Answer:   [facility]   Blood Thinner Instructions to Nursing    If the patient requires a Lovenox-bridge therapy, make sure arrangements are made to institute it with the assistance of the PCP.    Standing Status:   Standing    Number of Occurrences:   36    Standing Expiration Date:   01/22/2021    Scheduling  Instructions:     Always stop the Eliquis (Apixaban) x 3 days prior to procedure or surgery.   Follow-up plan:   Return for Procedure (w/ sedation): (R) L2 TFESI + (L) L4 TFESI, (Blood-thinner Protocol).      Interventional management options:  Considering:   NOTE: ELIQUIS ANTICOAGULATION (Stop: x3 days prior to procedure   Restart 6 hours after procedures) Diagnostic right SI joint block Possible bilateral SI joint RFA Diagnostic bilateral femoral nerve + obturator NB Possible bilateral femoral nerve + obturator nerve RFA Diagnostic right IA shoulder joint injection   Palliative PRN treatment(s):   Diagnostic left SI joint block #2  Palliative left CESI #3  Palliative left trochanteric bursa injection #2  Palliative right IA hip inj. #2  (100% on the right) Palliative left IA hip inj. #3   Palliative right lumbar facet block #3 Palliative left lumbar facet block #5 Palliative right lumbar facet RFA #2 (Last done on  09/19/18) Palliative left lumbar facet RFA #4 (Last done on 08/01/18) Palliative bilateral suprascapular nerve block Palliative bilateral suprascapular nerve RFA #2(Last done 07/13/18)       Recent Visits Date Type Provider Dept  01/10/20 Procedure visit Milinda Pointer, MD [facility]  12/20/19 Telemedicine Milinda Pointer, MD [facility]  Showing recent visits within past 90 days and meeting all other requirements   Today's Visits Date Type Provider Dept  01/23/20 Telemedicine Milinda Pointer, MD [facility]  Showing today's visits and meeting all other requirements   Future Appointments Date Type Provider Dept  03/17/20 Appointment Milinda Pointer, MD [facility]  Showing future appointments within next 90 days and meeting all other requirements   I discussed the assessment and treatment plan with the patient. The patient was provided an opportunity to ask questions and all were answered. The patient agreed with the plan and demonstrated an understanding of the instructions.  Patient advised to call back or seek an in-person evaluation if the symptoms or condition worsens.  Duration of encounter: 15 minutes.  Note by: Gaspar Cola, MD Date: 01/23/2020; Time: 3:58 PM

## 2020-01-31 ENCOUNTER — Other Ambulatory Visit: Payer: Self-pay

## 2020-01-31 ENCOUNTER — Ambulatory Visit (HOSPITAL_BASED_OUTPATIENT_CLINIC_OR_DEPARTMENT_OTHER): Payer: Medicare HMO | Admitting: Pain Medicine

## 2020-01-31 ENCOUNTER — Ambulatory Visit
Admission: RE | Admit: 2020-01-31 | Discharge: 2020-01-31 | Disposition: A | Discharge status: Home / Self Care | Payer: Medicare HMO | Source: Ambulatory Visit | Attending: Pain Medicine | Admitting: Pain Medicine

## 2020-01-31 ENCOUNTER — Encounter: Payer: Self-pay | Admitting: Pain Medicine

## 2020-01-31 VITALS — BP 146/90 | HR 60 | Temp 97.1°F | Resp 12 | Ht 66.0 in | Wt 158.0 lb

## 2020-01-31 DIAGNOSIS — M48061 Spinal stenosis, lumbar region without neurogenic claudication: Secondary | ICD-10-CM

## 2020-01-31 DIAGNOSIS — G8929 Other chronic pain: Secondary | ICD-10-CM | POA: Insufficient documentation

## 2020-01-31 DIAGNOSIS — Z7901 Long term (current) use of anticoagulants: Secondary | ICD-10-CM | POA: Diagnosis not present

## 2020-01-31 DIAGNOSIS — M961 Postlaminectomy syndrome, not elsewhere classified: Secondary | ICD-10-CM | POA: Diagnosis not present

## 2020-01-31 DIAGNOSIS — M4310 Spondylolisthesis, site unspecified: Secondary | ICD-10-CM | POA: Diagnosis not present

## 2020-01-31 DIAGNOSIS — M5416 Radiculopathy, lumbar region: Secondary | ICD-10-CM | POA: Diagnosis not present

## 2020-01-31 DIAGNOSIS — M5137 Other intervertebral disc degeneration, lumbosacral region: Secondary | ICD-10-CM | POA: Insufficient documentation

## 2020-01-31 MED ORDER — IOHEXOL 180 MG/ML  SOLN
10.0000 mL | Freq: Once | INTRAMUSCULAR | Status: AC
  Administered 2020-01-31: 10 mL via EPIDURAL
  Filled 2020-01-31: qty 20

## 2020-01-31 MED ORDER — MIDAZOLAM HCL 5 MG/5ML IJ SOLN
1.0000 mg | INTRAMUSCULAR | Status: DC | PRN
  Administered 2020-01-31: 1 mg via INTRAVENOUS
  Filled 2020-01-31: qty 5

## 2020-01-31 MED ORDER — FENTANYL CITRATE (PF) 100 MCG/2ML IJ SOLN
25.0000 ug | INTRAMUSCULAR | Status: DC | PRN
  Administered 2020-01-31: 50 ug via INTRAVENOUS
  Filled 2020-01-31: qty 2

## 2020-01-31 MED ORDER — LIDOCAINE HCL 2 % IJ SOLN
20.0000 mL | Freq: Once | INTRAMUSCULAR | Status: AC
  Administered 2020-01-31: 400 mg
  Filled 2020-01-31: qty 20

## 2020-01-31 MED ORDER — SODIUM CHLORIDE 0.9% FLUSH
2.0000 mL | Freq: Once | INTRAVENOUS | Status: AC
  Administered 2020-01-31: 2 mL

## 2020-01-31 MED ORDER — DEXAMETHASONE SODIUM PHOSPHATE 10 MG/ML IJ SOLN
20.0000 mg | Freq: Once | INTRAMUSCULAR | Status: AC
  Administered 2020-01-31: 20 mg
  Filled 2020-01-31: qty 2

## 2020-01-31 MED ORDER — ROPIVACAINE HCL 2 MG/ML IJ SOLN
2.0000 mL | Freq: Once | INTRAMUSCULAR | Status: AC
  Administered 2020-01-31: 2 mL via EPIDURAL
  Filled 2020-01-31: qty 10

## 2020-01-31 MED ORDER — SODIUM CHLORIDE (PF) 0.9 % IJ SOLN
INTRAMUSCULAR | Status: AC
  Filled 2020-01-31: qty 10

## 2020-01-31 MED ORDER — LACTATED RINGERS IV SOLN
1000.0000 mL | Freq: Once | INTRAVENOUS | Status: AC
  Administered 2020-01-31: 1000 mL via INTRAVENOUS

## 2020-01-31 NOTE — Progress Notes (Signed)
Safety precautions to be maintained throughout the outpatient stay will include: orient to surroundings, keep bed in low position, maintain call bell within reach at all times, provide assistance with transfer out of bed and ambulation.  

## 2020-01-31 NOTE — Progress Notes (Signed)
PROVIDER NOTE: Information contained herein reflects review and annotations entered in association with encounter. Interpretation of such information and data should be left to medically-trained personnel. Information provided to patient can be located elsewhere in the medical record under "Patient Instructions". Document created using STT-dictation technology, any transcriptional errors that may result from process are unintentional.    Patient: Kristin [name]  Service Category: Procedure  Provider: Gaspar Cola, MD  DOB: 12/16/1929  DOS: 01/31/2020  Location: [facility]  MRN: [id number]  Setting: Ambulatory - outpatient  Referring Provider: [name] *  Type: Established Patient  Specialty: Interventional Pain Management  PCP: Olin Hauser, DO   Primary Reason for Visit: Interventional Pain Management Treatment. CC: Back Pain (low)  Procedure:          Anesthesia, Analgesia, Anxiolysis:  Type: Trans-Foraminal Epidural Steroid Injection          Purpose: Diagnostic/Therapeutic Region: Posterolateral Lumbosacral Target Area: The 6 o'clock position under the pedicle, on the affected side. Approach: Posterior Percutaneous Paravertebral approach. Level: L2 & L4 Levels Laterality: Right L2 and left L4       Type: Moderate (Conscious) Sedation combined with Local Anesthesia Indication(s): Analgesia and Anxiety Route: Intravenous (IV) IV Access: Secured Sedation: Meaningful verbal contact was maintained at all times during the procedure  Local Anesthetic: Lidocaine 1-2%  Position: Prone   Indications: 1. Lumbar foraminal stenosis (Severe Left L4-5)   2. 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 Grade 1 Retrolisthesis of L1 over L2   4. 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 DDD (degenerative disc disease), lumbosacral   6. Chronic lumbar radicular pain (Bilateral) (L>R)   7. Chronic anticoagulation  (Eliquis)    Pain Score: Pre-procedure: 7 /10 Post-procedure: 0-No pain/10   Pre-op Assessment:  Kristin [name] is a 86 y.o. (year old), female patient, seen today for interventional treatment. She  has a past surgical history that includes Abdominal hysterectomy; Back surgery; Rotator cuff repair; Breast surgery; Cataract extraction w/PHACO (10/19/2011); Cataract extraction w/PHACO (11/02/2011); Carpal tunnel release; Joint replacement; Colon surgery; Pacemaker insertion (N/A, 12/24/2015); Cholecystectomy; Breast lumpectomy (Right, 2004); and Insert / replace / remove pacemaker. Kristin [name] has a current medication list which includes the following prescription(s): acetaminophen, albuterol, apixaban, bisacodyl, calcium carbonate-vitamin d, vitamin d, furosemide, levocetirizine, lisinopril, melatonin, oxycodone hcl, [START ON 02/27/2020] oxycodone hcl, oxygen-helium, polyethylene glycol, ropinirole, benefiber, and oxycodone hcl, and the following Facility-Administered Medications: fentanyl and midazolam. Her primarily concern today is the Back Pain (low)  Initial Vital Signs:  Pulse/HCG Rate: 60ECG Heart Rate: 61 Temp: 97.8 F (36.6 C) Resp: 18 BP: 129/70 SpO2: 100 %  BMI: Estimated body mass index is 25.5 kg/m as calculated from the following:   Height as of this encounter: 5\' 6"  (1.676 m).   Weight as of this encounter: 158 lb (71.7 kg).  Risk Assessment: Allergies: Reviewed. She is allergic to flexeril [cyclobenzaprine hcl] and vancomycin.  Allergy Precautions: None required Coagulopathies: Reviewed. None identified.  Blood-thinner therapy: None at this time Active Infection(s): Reviewed. None identified. Kristin [name] is afebrile  Site Confirmation: Kristin [name] was asked to confirm the procedure and laterality before marking the site Procedure checklist: Completed Consent: Before the procedure and under the influence of no sedative(s), amnesic(s), or anxiolytics, the patient was informed of the  treatment options, risks and possible complications. To fulfill our ethical and legal obligations, as recommended by the American Medical Association's Code of Ethics, I  have informed the patient of my clinical impression; the nature and purpose of the treatment or procedure; the risks, benefits, and possible complications of the intervention; the alternatives, including doing nothing; the risk(s) and benefit(s) of the alternative treatment(s) or procedure(s); and the risk(s) and benefit(s) of doing nothing. The patient was provided information about the general risks and possible complications associated with the procedure. These may include, but are not limited to: failure to achieve desired goals, infection, bleeding, organ or nerve damage, allergic reactions, paralysis, and death. In addition, the patient was informed of those risks and complications associated to Spine-related procedures, such as failure to decrease pain; infection (i.e.: Meningitis, epidural or intraspinal abscess); bleeding (i.e.: epidural hematoma, subarachnoid hemorrhage, or any other type of intraspinal or peri-dural bleeding); organ or nerve damage (i.e.: Any type of peripheral nerve, nerve root, or spinal cord injury) with subsequent damage to sensory, motor, and/or autonomic systems, resulting in permanent pain, numbness, and/or weakness of one or several areas of the body; allergic reactions; (i.e.: anaphylactic reaction); and/or death. Furthermore, the patient was informed of those risks and complications associated with the medications. These include, but are not limited to: allergic reactions (i.e.: anaphylactic or anaphylactoid reaction(s)); adrenal axis suppression; blood sugar elevation that in diabetics may result in ketoacidosis or comma; water retention that in patients with history of congestive heart failure may result in shortness of breath, pulmonary edema, and decompensation with resultant heart failure; weight gain;  swelling or edema; medication-induced neural toxicity; particulate matter embolism and blood vessel occlusion with resultant organ, and/or nervous system infarction; and/or aseptic necrosis of one or more joints. Finally, the patient was informed that Medicine is not an exact science; therefore, there is also the possibility of unforeseen or unpredictable risks and/or possible complications that may result in a catastrophic outcome. The patient indicated having understood very clearly. We have given the patient no guarantees and we have made no promises. Enough time was given to the patient to ask questions, all of which were answered to the patient's satisfaction. Ms. Slatten has indicated that she wanted to continue with the procedure. Attestation: I, the ordering provider, attest that I have discussed with the patient the benefits, risks, side-effects, alternatives, likelihood of achieving goals, and potential problems during recovery for the procedure that I have provided informed consent. Date  Time: 01/31/2020  8:27 AM  Pre-Procedure Preparation:  Monitoring: As per clinic protocol. Respiration, ETCO2, SpO2, BP, heart rate and rhythm monitor placed and checked for adequate function Safety Precautions: Patient was assessed for positional comfort and pressure points before starting the procedure. Time-out: I initiated and conducted the "Time-out" before starting the procedure, as per protocol. The patient was asked to participate by confirming the accuracy of the "Time Out" information. Verification of the correct person, site, and procedure were performed and confirmed by me, the nursing staff, and the patient. "Time-out" conducted as per Joint Commission's Universal Protocol (UP.01.01.01). Time: 0925  Description of Procedure:          Area Prepped: Entire Posterior Lumbosacral Area DuraPrep (Iodine Povacrylex [0.7% available iodine] and Isopropyl Alcohol, 74% w/w) Safety Precautions: Aspiration  looking for blood return was conducted prior to all injections. At no point did we inject any substances, as a needle was being advanced. No attempts were made at seeking any paresthesias. Safe injection practices and needle disposal techniques used. Medications properly checked for expiration dates. SDV (single dose vial) medications used. Description of the Procedure: Protocol guidelines were followed. The patient  was placed in position over the procedure table. The target area was identified and the area prepped in the usual manner. Skin & deeper tissues infiltrated with local anesthetic. Appropriate amount of time allowed to pass for local anesthetics to take effect. The procedure needles were then advanced to the target area. Proper needle placement secured. Negative aspiration confirmed. Solution injected in intermittent fashion, asking for systemic symptoms every 0.5cc of injectate. The needles were then removed and the area cleansed, making sure to leave some of the prepping solution back to take advantage of its long term bactericidal properties.  Vitals:   01/31/20 0938 01/31/20 0947 01/31/20 0957 01/31/20 1007  BP: 125/75 (!) 145/76 (!) 141/77 (!) 146/90  Pulse: 60     Resp: 12 14 16 12   Temp:  (!) 97.4 F (36.3 C)  (!) 97.1 F (36.2 C)  TempSrc:  Temporal  Temporal  SpO2: 99% 93% 100% 100%  Weight:      Height:        Start Time: 0925 hrs. End Time: 0935 hrs.  Materials:  Needle(s) Type: Spinal Needle Gauge: 22G Length: 5-in Medication(s): Please see orders for medications and dosing details.  Imaging Guidance (Spinal):          Type of Imaging Technique: Fluoroscopy Guidance (Spinal) Indication(s): Assistance in needle guidance and placement for procedures requiring needle placement in or near specific anatomical locations not easily accessible without such assistance. Exposure Time: Please see nurses notes. Contrast: Before injecting any contrast, we confirmed that the  patient did not have an allergy to iodine, shellfish, or radiological contrast. Once satisfactory needle placement was completed at the desired level, radiological contrast was injected. Contrast injected under live fluoroscopy. No contrast complications. See chart for type and volume of contrast used. Fluoroscopic Guidance: I was personally present during the use of fluoroscopy. "Tunnel Vision Technique" used to obtain the best possible view of the target area. Parallax error corrected before commencing the procedure. "Direction-depth-direction" technique used to introduce the needle under continuous pulsed fluoroscopy. Once target was reached, antero-posterior, oblique, and lateral fluoroscopic projection used confirm needle placement in all planes. Images permanently stored in EMR. Interpretation: I personally interpreted the imaging intraoperatively. Adequate needle placement confirmed in multiple planes. Appropriate spread of contrast into desired area was observed. No evidence of afferent or efferent intravascular uptake. No intrathecal or subarachnoid spread observed. Permanent images saved into the patient's record.  Antibiotic Prophylaxis:   Anti-infectives (From admission, onward)   None     Indication(s): None identified  Post-operative Assessment:  Post-procedure Vital Signs:  Pulse/HCG Rate: 6064 Temp: (!) 97.1 F (36.2 C) Resp: 12 BP: (!) 146/90 SpO2: 100 %  EBL: None  Complications: No immediate post-treatment complications observed by team, or reported by patient.  Note: The patient tolerated the entire procedure well. A repeat set of vitals were taken after the procedure and the patient was kept under observation following institutional policy, for this type of procedure. Post-procedural neurological assessment was performed, showing return to baseline, prior to discharge. The patient was provided with post-procedure discharge instructions, including a section on how to  identify potential problems. Should any problems arise concerning this procedure, the patient was given instructions to immediately contact us, at any time, without hesitation. In any case, we plan to contact the patient by telephone for a follow-up status report regarding this interventional procedure.  Comments:  No additional relevant information.  Plan of Care  Orders:  Orders Placed This Encounter  Procedures  .  Lumbar Transforaminal Epidural    Scheduling Instructions:     Side: Bilateral (R-L2; L-L4)     Level: L2 & L4     Sedation: Patient's choice.     Timeframe: Today    Order Specific Question:   Where will this procedure be performed?    Answer:   [facility]  . [facility] C-ARM 1-60 MIN NO REPORT    Intraoperative interpretation by procedural physician at [facility].    Standing Status:   Standing    Number of Occurrences:   1    Order Specific Question:   Reason for exam:    Answer:   Assistance in needle guidance and placement for procedures requiring needle placement in or near specific anatomical locations not easily accessible without such assistance.  . Informed Consent Details: Physician/Practitioner Attestation; Transcribe to consent form and obtain patient signature    Provider Attestation: I, Dante Dossie Arbour, MD, (Pain Management Specialist), the physician/practitioner, attest that I have discussed with the patient the benefits, risks, side effects, alternatives, likelihood of achieving goals and potential problems during recovery for the procedure that I have provided informed consent.    Scheduling Instructions:     Procedure: Diagnostic lumbar transforaminal epidural steroid injection under fluoroscopic guidance. (See notes for level and laterality.)     Indication/Reason: Lumbar radiculopathy/radiculitis associated with lumbar stenosis     Note: Always confirm laterality of pain with Ms. Storer, before procedure.     Transcribe to  consent form and obtain patient signature.  . Care order/instruction: Please confirm that the patient has stopped the Eliquis (Apixaban) x 3 days prior to procedure or surgery.    Please confirm that the patient has stopped the Eliquis (Apixaban) x 3 days prior to procedure or surgery.    Standing Status:   Standing    Number of Occurrences:   1  . Provide equipment / supplies at bedside    Equipment required: Single use, disposable, "Block Tray"    Standing Status:   Standing    Number of Occurrences:   1    Order Specific Question:   Specify    Answer:   Block Tray  . Bleeding precautions    Standing Status:   Standing    Number of Occurrences:   1   Chronic Opioid Analgesic:  Oxycodone IR 20 mg, 1 tab PO q 8 hrs (60 mg/day of oxycodone) MME/day: 90 mg/day.   Medications ordered for procedure: Meds ordered this encounter  Medications  . iohexol (OMNIPAQUE) 180 MG/ML injection 10 mL    Must be Myelogram-compatible. If not available, you may substitute with a water-soluble, non-ionic, hypoallergenic, myelogram-compatible radiological contrast medium.  Marland Kitchen lidocaine (XYLOCAINE) 2 % (with pres) injection 400 mg  . lactated ringers infusion 1,000 mL  . midazolam (VERSED) 5 MG/5ML injection 1-2 mg    Make sure Flumazenil is available in the pyxis when using this medication. If oversedation occurs, administer 0.2 mg IV over 15 sec. If after 45 sec no response, administer 0.2 mg again over 1 min; may repeat at 1 min intervals; not to exceed 4 doses (1 mg)  . fentaNYL (SUBLIMAZE) injection 25-50 mcg    Make sure Narcan is available in the pyxis when using this medication. In the event of respiratory depression (RR< 8/min): Titrate NARCAN (naloxone) in increments of 0.1 to 0.2 mg IV at 2-3 minute intervals, until desired degree of reversal.  . sodium chloride flush (NS) 0.9 % injection  2 mL  . ropivacaine (PF) 2 mg/mL (0.2%) (NAROPIN) injection 2 mL  . dexamethasone (DECADRON) injection 20 mg    Medications administered: We administered iohexol, lidocaine, lactated ringers, midazolam, fentaNYL, sodium chloride flush, ropivacaine (PF) 2 mg/mL (0.2%), and dexamethasone.  See the medical record for exact dosing, route, and time of administration.  Follow-up plan:   Return in about 2 weeks (around 02/14/2020) for (VV), (PP).       Interventional management options:  Considering:   NOTE: ELIQUIS ANTICOAGULATION (Stop: x3 days prior to procedure  Restart 6 hours after procedures) Diagnostic right SI joint block Possible bilateral SI joint RFA Diagnostic bilateral femoral nerve + obturator NB Possible bilateral femoral nerve + obturator nerve RFA Diagnostic right IA shoulder joint injection   Palliative PRN treatment(s):   Diagnostic left SI joint block #2  Palliative left CESI #3  Palliative left trochanteric bursa injection #2  Palliative right IA hip inj. #2  (100% on the right) Palliative left IA hip inj. #3   Palliative right lumbar facet block #3 Palliative left lumbar facet block #5 Palliative right lumbar facet RFA #2 (Last done on 09/19/18) Palliative left lumbar facet RFA #4 (Last done on 08/01/18) Palliative bilateral suprascapular nerve block Palliative bilateral suprascapular nerve RFA #2(Last done 07/13/18)    Recent Visits Date Type Provider Dept  01/23/20 Telemedicine Milinda Pointer, MD [facility]  01/10/20 Procedure visit Milinda Pointer, MD [facility]  12/20/19 Telemedicine Milinda Pointer, MD [facility]  Showing recent visits within past 90 days and meeting all other requirements Today's Visits Date Type Provider Dept  01/31/20 Procedure visit Milinda Pointer, MD [facility]  Showing today's visits and meeting all other requirements Future Appointments Date Type Provider Dept  02/14/20 Appointment Milinda Pointer, MD [facility]  03/17/20 Appointment Milinda Pointer,  MD [facility]  Showing future appointments within next 90 days and meeting all other requirements  Disposition: Discharge home  Discharge (Date  Time): 01/31/2020; 1014 hrs.   Primary Care Physician: Olin Hauser, DO Location: [facility] Note by: Gaspar Cola, MD Date: 01/31/2020; Time: 10:31 AM  Disclaimer:  Medicine is not an exact science. The only guarantee in medicine is that nothing is guaranteed. It is important to note that the decision to proceed with this intervention was based on the information collected from the patient. The Data and conclusions were drawn from the patient's questionnaire, the interview, and the physical examination. Because the information was provided in large part by the patient, it cannot be guaranteed that it has not been purposely or unconsciously manipulated. Every effort has been made to obtain as much relevant data as possible for this evaluation. It is important to note that the conclusions that lead to this procedure are derived in large part from the available data. Always take into account that the treatment will also be dependent on availability of resources and existing treatment guidelines, considered by other Pain Management Practitioners as being common knowledge and practice, at the time of the intervention. For Medico-Legal purposes, it is also important to point out that variation in procedural techniques and pharmacological choices are the acceptable norm. The indications, contraindications, technique, and results of the above procedure should only be interpreted and judged by a Board-Certified Interventional Pain Specialist with extensive familiarity and expertise in the same exact procedure and technique.

## 2020-01-31 NOTE — Patient Instructions (Signed)

## 2020-02-01 ENCOUNTER — Telehealth: Payer: Self-pay

## 2020-02-01 NOTE — Telephone Encounter (Signed)
Post procedure phone call.  Unable to leave a message.  Voicemail has not been established at this time.

## 2020-02-13 ENCOUNTER — Telehealth: Payer: Self-pay

## 2020-02-13 NOTE — Progress Notes (Signed)
Patient: Kristin [name]  Service Category: E/M  Provider: Gaspar Cola, MD  DOB: 10-16-1929  DOS: 02/14/2020  Location: Office  MRN: [id number]  Setting: Ambulatory outpatient  Referring Provider: [name] *  Type: Established Patient  Specialty: Interventional Pain Management  PCP: Olin Hauser, DO  Location: Remote location  Delivery: TeleHealth     Virtual Encounter - Pain Management PROVIDER NOTE: Information contained herein reflects review and annotations entered in association with encounter. Interpretation of such information and data should be left to medically-trained personnel. Information provided to patient can be located elsewhere in the medical record under "Patient Instructions". Document created using STT-dictation technology, any transcriptional errors that may result from process are unintentional.    Contact & Pharmacy Preferred: [phone] Home: [phone] (home) Mobile: There is no such number on file (mobile). E-mail: [email or web address]  [facility], [address] [place] Phone: [phone] Fax: [phone]   Pre-screening  Kristin [name] offered "in-person" vs "virtual" encounter. She indicated preferring virtual for this encounter.   Reason COVID-19*  Social distancing based on CDC and AMA recommendations.   I contacted [phone] on 02/14/2020 via telephone.      I clearly identified myself as 02/27/2020, MD. I verified that I was speaking with the correct person using two identifiers (Name: Kristin [name], and date of birth: August 03, 1930).  Consent I sought verbal advanced consent from 16/08/1929 for virtual visit interactions. I informed Kristin [name] of possible security and privacy concerns, risks, and limitations associated with providing "not-in-person" medical evaluation and management services. I also informed Kristin [name] of the availability of  "in-person" appointments. Finally, I informed her that there would be a charge for the virtual visit and that she could be  personally, fully or partially, financially responsible for it. Kristin [name] expressed understanding and agreed to proceed.   Historic Elements   Kristin [name] is a 86 y.o. year old, female patient evaluated today after her last contact with our practice on 02/13/2020. Kristin [name]  has a past medical history of Acute postoperative pain (02/09/2018), Arrhythmia, sinus node (04/03/2014), Arthritis, Arthritis, Atrial fibrillation ([place]), Back pain, Bradycardia, Breast cancer ([place]) (2004), Cancer ([place]) (2004), CHF (congestive heart failure) ([place]), Chronic back pain, CKD (chronic kidney disease), COPD (chronic obstructive pulmonary disease) ([place]), Cystocele, Decubitus ulcers, Dehydration, Gout, Hematuria, Hepatitis C, Hyperlipidemia, Hypertension, Hypomagnesemia (06/25/2015), OAB (overactive bladder), Overactive bladder, Restless leg, Shoulder pain, bilateral, Urinary frequency, UTI (lower urinary tract infection), and Vaginal atrophy. She also  has a past surgical history that includes Abdominal hysterectomy; Back surgery; Rotator cuff repair; Breast surgery; Cataract extraction w/PHACO (10/19/2011); Cataract extraction w/PHACO (11/02/2011); Carpal tunnel release; Joint replacement; Colon surgery; Pacemaker insertion (N/A, 12/24/2015); Cholecystectomy; Breast lumpectomy (Right, 2004); and Insert / replace / remove pacemaker. Kristin [name] has a current medication list which includes the following prescription(s): acetaminophen, albuterol, apixaban, bisacodyl, calcium carbonate-vitamin d, vitamin d, furosemide, levocetirizine, lisinopril, melatonin, [START ON 02/27/2020] oxycodone hcl, oxygen-helium, polyethylene glycol, ropinirole, and benefiber. She  reports that she quit smoking about 21 years ago. Her smoking use included cigarettes. She has a 50.00 pack-year smoking history. She has quit using smokeless  tobacco. She reports that she does not drink alcohol and does not use drugs. Kristin [name] is allergic to flexeril [cyclobenzaprine hcl] and vancomycin.   HPI  Today, she is being contacted for a post-procedure assessment.  According to the patient, she had  100% relief of the pain for the duration of the local anesthetic, however once the numbing medicine wore off, her pain came back just as bad as it was before.  Today she indicates that her pain is primarily in the lower back and she has absolutely no lower extremity pain.  She also indicates that this pain is worse on the left side when compared to the right.  On 01/31/2020 we went ahead and did a right-sided L2 transforaminal ESI and a left-sided L4 transforaminal ESI.  The L4 level was done for the purpose of treating the pain down her lower extremity which at this point seems to be completely gone suggesting that it did work in providing her with the relief that we were looking for for the lower extremity.  However, she has continued to have some pain in the left side of the lower back more so than the right.  This again is what I would have expected since the L2 transforaminal was done on the right side and although the day of the procedure this is where she was having the worst pain in her back, today he seems to be the right side.  She does have severe left L4-5 foraminal stenosis and she also has a grade 1 retrolisthesis L1 over L2.  Since the patient continues to have recurrence of her low back pain, I have offered to the patient to send her to be evaluated by a neurosurgeon.  She had a recent MRI done that they can use to do that evaluation.  Post-Procedure Evaluation  Procedure: (01/31/2020) diagnostic/therapeutic right L2 TFESI & left L4 TFESI under fluoroscopic guidance and IV sedation Pre-procedure pain level: 7/10 Post-procedure: 0/10 (100% relief)  Sedation: Sedation provided.  Effectiveness during initial hour after procedure(Ultra-Short  Term Relief): 100 %.  Local anesthetic used: Long-acting (4-6 hours) Effectiveness: Defined as any analgesic benefit obtained secondary to the administration of local anesthetics. This carries significant diagnostic value as to the etiological location, or anatomical origin, of the pain. Duration of benefit is expected to coincide with the duration of the local anesthetic used.  Effectiveness during initial 4-6 hours after procedure(Short-Term Relief): 100 %.  Long-term benefit: Defined as any relief past the pharmacologic duration of the local anesthetics.  Effectiveness past the initial 6 hours after procedure(Long-Term Relief): 0 %.  Current benefits: Defined as benefit that persist at this time.   Analgesia:  Back to baseline Function: Back to baseline ROM: Back to baseline  Pharmacotherapy Assessment  Analgesic: Oxycodone IR 20 mg, 1 tab PO q 8 hrs (60 mg/day of oxycodone) MME/day: 90 mg/day.   Monitoring: [place] PMP: PDMP reviewed during this encounter.       Pharmacotherapy: No side-effects or adverse reactions reported. Compliance: No problems identified. Effectiveness: Clinically acceptable. Plan: Refer to "POC".  UDS:  Summary  Date Value Ref Range Status  02/01/2018 FINAL  Final    Comment:    ==================================================================== TOXASSURE SELECT 13 (MW) ==================================================================== Test                             Result       Flag       Units Drug Present and Declared for Prescription Verification   Oxycodone                      401          EXPECTED   ng/mg creat  Oxymorphone                    438          EXPECTED   ng/mg creat   Noroxycodone                   1122         EXPECTED   ng/mg creat   Noroxymorphone                 309          EXPECTED   ng/mg creat    Sources of oxycodone are scheduled prescription medications.    Oxymorphone, noroxycodone, and noroxymorphone are expected     metabolites of oxycodone. Oxymorphone is also available as a    scheduled prescription medication. ==================================================================== Test                      Result    Flag   Units      Ref Range   Creatinine              79               mg/dL      >=20 ==================================================================== Declared Medications:  The flagging and interpretation on this report are based on the  following declared medications.  Unexpected results may arise from  inaccuracies in the declared medications.  **Note: The testing scope of this panel includes these medications:  Oxycodone  **Note: The testing scope of this panel does not include following  reported medications:  Albuterol (Proventil)  Apixaban  Bisacodyl  Furosemide (Lasix)  Lisinopril  Melatonin  Polyethylene Glycol (MiraLAX)  Ropinirole  Vitamin D ==================================================================== For clinical consultation, please call [phone]. ====================================================================     Laboratory Chemistry Profile   Renal Lab Results  Component Value Date   BUN 27 (H) 05/08/2019   CREATININE 1.22 (H) 05/08/2019   BCR 22 05/08/2019   GFRAA 46 (L) 05/08/2019   GFRNONAA 40 (L) 05/08/2019     Hepatic Lab Results  Component Value Date   AST 21 07/25/2018   ALT 11 07/25/2018   ALBUMIN 3.6 08/22/2017   ALKPHOS 60 08/22/2017     Electrolytes Lab Results  Component Value Date   NA 137 05/08/2019   K 5.0 05/08/2019   CL 109 05/08/2019   CALCIUM 10.1 05/08/2019   MG 2.2 09/24/2015     Bone No results found for: VD25OH, VD125OH2TOT, JJ0093GH8, EX9371IR6, 25OHVITD1, 25OHVITD2, 25OHVITD3, TESTOFREE, TESTOSTERONE   Inflammation (CRP: Acute Phase) (ESR: Chronic Phase) Lab Results  Component Value Date   CRP <0.5 09/24/2015   ESRSEDRATE 12 09/24/2015       Note: Above Lab results  reviewed.   Imaging  DG PAIN CLINIC C-ARM 1-60 MIN NO REPORT Fluoro was used, but no Radiologist interpretation will be provided.  Please refer to "NOTES" tab for provider progress note.  Assessment  The primary encounter diagnosis was Chronic pain syndrome. Diagnoses of Chronic low back pain (Primary Area of Pain) (Bilateral) (L>R), Chronic lower extremity pain (Secondary Area of Pain) (Bilateral) (L>R), Chronic cervical radicular pain (Tertiary Area of Pain) (Bilateral) (L>R), Lumbar lateral recess stenosis (L1-2 and L4-5), Lumbar foraminal stenosis (Severe Left L4-5), Lumbar facet hypertrophy (L1-2, L2-3, and L4-5), Lumbar facet syndrome (Bilateral) (L>R), Grade 1 Retrolisthesis of L1 over L2, Chronic anticoagulation ([place]), Uncomplicated opioid dependence ([place]), and Pharmacologic therapy were also pertinent to this visit.  Plan of Care  Problem-specific:  No problem-specific Assessment & Plan notes found for this encounter.  Kristin [name] has a current medication list which includes the following long-term medication(s): albuterol, apixaban, bisacodyl, furosemide, levocetirizine, lisinopril, melatonin, [START ON 02/27/2020] oxycodone hcl, polyethylene glycol, ropinirole, and benefiber.  Pharmacotherapy (Medications Ordered): No orders of the defined types were placed in this encounter.  Orders:  Orders Placed This Encounter  Procedures  . ToxASSURE Select 13 (MW), Urine    Volume: 30 ml(s). Minimum 3 ml of urine is needed. Document temperature of fresh sample. Indications: Long term (current) use of opiate analgesic (G50.037)    Order Specific Question:   Release to patient    Answer:   Immediate  . Ambulatory referral to Neurosurgery    Referral Priority:   Routine    Referral Type:   Surgical    Referral Reason:   Specialty Services Required    Requested Specialty:   Neurosurgery    Number of Visits Requested:   1   Follow-up plan:   Return in about 1 month (around  03/17/2020) for regular appointment.      Interventional management options:  Considering:   NOTE: ELIQUIS ANTICOAGULATION (Stop: x3 days prior to procedure  Restart 6 hours after procedures) Diagnostic right SI joint block Possible bilateral SI joint RFA Diagnostic bilateral femoral nerve + obturator NB Possible bilateral femoral nerve + obturator nerve RFA Diagnostic right IA shoulder joint injection   Palliative PRN treatment(s):   Diagnostic left SI joint block #2  Palliative left CESI #3  Palliative left trochanteric bursa injection #2  Palliative right IA hip inj. #2  (100% on the right) Palliative left IA hip inj. #3   Palliative right lumbar facet block #3 Palliative left lumbar facet block #5 Palliative right lumbar facet RFA #2 (Last done on 09/19/18) Palliative left lumbar facet RFA #4 (Last done on 08/01/18) Palliative bilateral suprascapular nerve block Palliative bilateral suprascapular nerve RFA #2(Last done 07/13/18)     Recent Visits Date Type Provider Dept  01/31/20 Procedure visit Milinda Pointer, MD [facility]  01/23/20 Telemedicine Milinda Pointer, MD [facility]  01/10/20 Procedure visit Milinda Pointer, MD [facility]  12/20/19 Telemedicine Milinda Pointer, MD [facility]  Showing recent visits within past 90 days and meeting all other requirements Today's Visits Date Type Provider Dept  02/14/20 Telemedicine Milinda Pointer, MD [facility]  Showing today's visits and meeting all other requirements Future Appointments Date Type Provider Dept  03/17/20 Appointment Milinda Pointer, MD [facility]  Showing future appointments within next 90 days and meeting all other requirements  I discussed the assessment and treatment plan with the patient. The patient was provided an opportunity to ask questions and all were answered. The patient agreed with the plan and  demonstrated an understanding of the instructions.  Patient advised to call back or seek an in-person evaluation if the symptoms or condition worsens.  Duration of encounter: 12 minutes.  Note by: Gaspar Cola, MD Date: 02/14/2020; Time: 1:43 PM

## 2020-02-13 NOTE — Telephone Encounter (Signed)
Attempted to call patient. Message  states that the call can not be completed at this time.

## 2020-02-14 ENCOUNTER — Other Ambulatory Visit: Payer: Self-pay

## 2020-02-14 ENCOUNTER — Telehealth: Payer: Self-pay

## 2020-02-14 ENCOUNTER — Encounter: Payer: Self-pay | Admitting: Pain Medicine

## 2020-02-14 ENCOUNTER — Ambulatory Visit: Payer: Medicare HMO | Attending: Pain Medicine | Admitting: Pain Medicine

## 2020-02-14 DIAGNOSIS — M79604 Pain in right leg: Secondary | ICD-10-CM | POA: Diagnosis not present

## 2020-02-14 DIAGNOSIS — M5412 Radiculopathy, cervical region: Secondary | ICD-10-CM | POA: Diagnosis not present

## 2020-02-14 DIAGNOSIS — M5441 Lumbago with sciatica, right side: Secondary | ICD-10-CM

## 2020-02-14 DIAGNOSIS — Z7901 Long term (current) use of anticoagulants: Secondary | ICD-10-CM

## 2020-02-14 DIAGNOSIS — G8929 Other chronic pain: Secondary | ICD-10-CM

## 2020-02-14 DIAGNOSIS — M47816 Spondylosis without myelopathy or radiculopathy, lumbar region: Secondary | ICD-10-CM | POA: Diagnosis not present

## 2020-02-14 DIAGNOSIS — M5442 Lumbago with sciatica, left side: Secondary | ICD-10-CM

## 2020-02-14 DIAGNOSIS — M79605 Pain in left leg: Secondary | ICD-10-CM

## 2020-02-14 DIAGNOSIS — G894 Chronic pain syndrome: Secondary | ICD-10-CM | POA: Diagnosis not present

## 2020-02-14 DIAGNOSIS — M48061 Spinal stenosis, lumbar region without neurogenic claudication: Secondary | ICD-10-CM | POA: Diagnosis not present

## 2020-02-14 DIAGNOSIS — F1129 Opioid dependence with unspecified opioid-induced disorder: Secondary | ICD-10-CM | POA: Insufficient documentation

## 2020-02-14 DIAGNOSIS — F1120 Opioid dependence, uncomplicated: Secondary | ICD-10-CM | POA: Diagnosis not present

## 2020-02-14 DIAGNOSIS — Z79899 Other long term (current) drug therapy: Secondary | ICD-10-CM

## 2020-02-14 DIAGNOSIS — M4310 Spondylolisthesis, site unspecified: Secondary | ICD-10-CM | POA: Diagnosis not present

## 2020-02-14 NOTE — Telephone Encounter (Signed)
Tried calling patient to discharge appt today, no answer, no vm

## 2020-02-19 DIAGNOSIS — Z79899 Other long term (current) drug therapy: Secondary | ICD-10-CM | POA: Diagnosis not present

## 2020-02-19 DIAGNOSIS — F1120 Opioid dependence, uncomplicated: Secondary | ICD-10-CM | POA: Diagnosis not present

## 2020-02-19 DIAGNOSIS — G894 Chronic pain syndrome: Secondary | ICD-10-CM | POA: Diagnosis not present

## 2020-02-27 LAB — TOXASSURE SELECT 13 (MW), URINE

## 2020-03-05 DIAGNOSIS — M79674 Pain in right toe(s): Secondary | ICD-10-CM | POA: Diagnosis not present

## 2020-03-05 DIAGNOSIS — M79675 Pain in left toe(s): Secondary | ICD-10-CM | POA: Diagnosis not present

## 2020-03-05 DIAGNOSIS — L97511 Non-pressure chronic ulcer of other part of right foot limited to breakdown of skin: Secondary | ICD-10-CM | POA: Diagnosis not present

## 2020-03-05 DIAGNOSIS — B351 Tinea unguium: Secondary | ICD-10-CM | POA: Diagnosis not present

## 2020-03-11 DIAGNOSIS — M4155 Other secondary scoliosis, thoracolumbar region: Secondary | ICD-10-CM | POA: Diagnosis not present

## 2020-03-14 ENCOUNTER — Ambulatory Visit (INDEPENDENT_AMBULATORY_CARE_PROVIDER_SITE_OTHER): Payer: Medicare HMO | Admitting: Pharmacist

## 2020-03-14 DIAGNOSIS — I4891 Unspecified atrial fibrillation: Secondary | ICD-10-CM | POA: Diagnosis not present

## 2020-03-14 DIAGNOSIS — I4892 Unspecified atrial flutter: Secondary | ICD-10-CM | POA: Diagnosis not present

## 2020-03-14 NOTE — Chronic Care Management (AMB) (Signed)
Chronic Care Management   Follow Up Note   03/14/2020 Name: Kristin [name] MRN: [id number] DOB: August 02, 1930  Referred by: Olin Hauser, DO Reason for referral : Chronic Care Management (Patient Phone Call)   Kristin [name] is a 86 y.o. year old female who is a primary care patient of Olin Hauser, DO. The CCM team was consulted for assistance with chronic disease management and care coordination needs.    I reached out to [name] by phone today.   Review of patient status, including review of consultants reports, relevant laboratory and other test results, and collaboration with appropriate care team members and the patient's provider was performed as part of comprehensive patient evaluation and provision of chronic care management services.    Objective  Outpatient Encounter Medications as of 03/14/2020  Medication Sig Note  . acetaminophen (TYLENOL) 500 MG tablet Take 500 mg by mouth 3 (three) times daily.   Marland Kitchen albuterol (PROVENTIL) (2.5 MG/3ML) 0.083% nebulizer solution Take 3 mLs (2.5 mg total) by nebulization every 8 (eight) hours as needed for wheezing or shortness of breath.   Marland Kitchen apixaban (ELIQUIS) 5 MG TABS tablet Take 1 tablet (5 mg total) by mouth 2 (two) times daily.   . bisacodyl (DULCOLAX) 5 MG EC tablet Take 2 tablets (10 mg total) by mouth daily as needed for moderate constipation.   . Calcium Carbonate-Vitamin D 600-200 MG-UNIT TABS Take by mouth.   . Cholecalciferol (VITAMIN D) 2000 units tablet Take 2,000 Units by mouth daily.    . furosemide (LASIX) 20 MG tablet TAKE (1/2) TABLET BY MOUTH ONCE DAILY.   Marland Kitchen levocetirizine (XYZAL) 5 MG tablet Take 0.5 tablets (2.5 mg total) by mouth every other day.   . lisinopril (ZESTRIL) 10 MG tablet Take 1 tablet (10 mg total) by mouth daily.   . Melatonin 10 MG CAPS Take 10 mg by mouth at bedtime.   . Oxycodone HCl 20 MG TABS Take 1 tablet (20 mg total) by mouth every 8 (eight) hours as needed. Must last 30  days   . OXYGEN Inhale 2 L into the lungs at bedtime.  04/25/2018: At night as needed  . polyethylene glycol (MIRALAX / GLYCOLAX) 17 g packet Take 17 g by mouth daily as needed for moderate constipation. Must last 30 days 12/19/2019: PRN  . rOPINIRole (REQUIP) 2 MG tablet Take 2 tablets (4 mg total) by mouth at bedtime. Must last 30 days   . Wheat Dextrin (BENEFIBER) POWD Take 6 g by mouth 3 (three) times daily before meals. (2 tsp = 6 g) 12/19/2019: PRN   No facility-administered encounter medications on file as of 03/14/2020.    Goals Addressed            This Visit's Progress   . PharmD - Medication Assistance       CARE PLAN ENTRY (see longitudinal plan of care for additional care plan information)  Current Barriers:  . Financial Barriers in complicated patient with multiple medical conditions including hypertension, CKD, Chronic heart failure with preserved ejection fraction, COPD, atrial fibrillation, seasonal allergies, chronic pain, restless leg syndrome; patient has [name] and reports copay for Eliquis  is cost prohibitive at this time o Note [place] assisted patient with applying for and receiving medication assistance from Eliquis in 2020 calendar year  Pharmacist Clinical Goal(s):  Marland Kitchen Over the next 30 days, patient will work with PharmD and providers to relieve medication access concerns  Interventions: . Perform  chart review . Place coordination of care call to [facility] to determine patient's current out of pocket spend for 2021 calendar year . Patient reports she has been having difficulty with her ongoing back pain. Note patient seen at [facility].  o Reports saw Dr. Lacinda Axon with [facility] on 7/20, who recommended that she start physical therapy o Patient has appointment with [facility] PT scheduled for 8/4  . Follow up with patient regarding importance of medication adherence o Confirms  using weekly pillbox as adherence aid . Eliquis by [place]: Patient meets income/out of pocket spend criteria for this medication's patient assistance program. Reviewed application process. Patient will provide proof of income, out of pocket spend report, and will sign application.  . Will collaborate with [facility] CPhT to assist patient with applying for patient assistance for Eliquis through [employer] . Inter-disciplinary care team collaboration (see longitudinal plan of care)  Patient Self Care Activities:  . Patient will provide necessary portions of application   Initial goal documentation        Plan  The care management team will reach out to the patient again over the next 30 days.   Harlow Asa, PharmD, [place] [employer] [phone]

## 2020-03-14 NOTE — Patient Instructions (Signed)
Thank you allowing the Chronic Care Management Team to be a part of your care! It was a pleasure speaking with you today!     CCM (Chronic Care Management) Team    Noreene Larsson RN, MSN, CCM Nurse Care Coordinator  [phone]   Harlow Asa PharmD  Clinical Pharmacist  [phone]   Eula Fried LCSW Clinical Social Worker [phone]  Visit Information  Goals Addressed            This Visit's Progress   . PharmD - Medication Assistance       CARE PLAN ENTRY (see longitudinal plan of care for additional care plan information)  Current Barriers:  . Financial Barriers in complicated patient with multiple medical conditions including hypertension, CKD, Chronic heart failure with preserved ejection fraction, COPD, atrial fibrillation, seasonal allergies, chronic pain, restless leg syndrome; patient has [name] and reports copay for Eliquis  is cost prohibitive at this time o Note [place] assisted patient with applying for and receiving medication assistance from Eliquis in 2020 calendar year  Pharmacist Clinical Goal(s):  Marland Kitchen Over the next 30 days, patient will work with PharmD and providers to relieve medication access concerns  Interventions: . Perform chart review . Place coordination of care call to [facility] to determine patient's current out of pocket spend for 2021 calendar year . Patient reports she has been having difficulty with her ongoing back pain. Note patient seen at [facility].  o Reports saw Dr. Lacinda Axon with [facility] on 7/20, who recommended that she start physical therapy o Patient has appointment with [facility] PT scheduled for 8/4  . Follow up with patient regarding importance of medication adherence o Confirms using weekly pillbox as adherence aid . Eliquis by [place]: Patient meets income/out of pocket spend criteria for this medication's patient  assistance program. Reviewed application process. Patient will provide proof of income, out of pocket spend report, and will sign application.  . Will collaborate with [facility] CPhT to assist patient with applying for patient assistance for Eliquis through [employer] . Inter-disciplinary care team collaboration (see longitudinal plan of care)  Patient Self Care Activities:  . Patient will provide necessary portions of application   Initial goal documentation        Patient verbalizes understanding of instructions provided today.   The care management team will reach out to the patient again over the next 30 days.   Harlow Asa, PharmD, [place] [employer] [phone]

## 2020-03-16 NOTE — Progress Notes (Signed)
PROVIDER NOTE: Information contained herein reflects review and annotations entered in association with encounter. Interpretation of such information and data should be left to medically-trained personnel. Information provided to patient can be located elsewhere in the medical record under "Patient Instructions". Document created using STT-dictation technology, any transcriptional errors that may result from process are unintentional.    Patient: Kristin [name]  Service Category: E/M  Provider: Gaspar Cola, MD  DOB: 1930/08/04  DOS: 03/17/2020  Specialty: Interventional Pain Management  MRN: [id number]  Setting: Ambulatory outpatient  PCP: Olin Hauser, DO  Type: Established Patient    Referring Provider: [name] *  Location: Office  Delivery: Face-to-face     HPI  Reason for encounter: [name], a 86 y.o. year old female, is here today for evaluation and management of her Chronic pain syndrome [G89.4]. Kristin [name] primary complain today is Back Pain (low) and Hip Pain (bilateral, left worse) Last encounter: Practice (02/14/2020). My last encounter with her was on 01/31/2020. 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); Epidural fibrosis; Lumbar foraminal stenosis (Severe Left L4-5); Chronic sacroiliac joint pain (Left); Osteoarthritis involving multiple joints; Chronic neck pain; Chronic hip pain (Bilateral); Chronic  cervical radicular pain (Tertiary Area of Pain) (Bilateral) (L>R); Chronic lower extremity pain (Secondary Area of Pain) (Bilateral) (L>R); DDD (degenerative disc disease), cervical; Numbness and tingling of upper extremity (Bilateral); Chronic shoulder pain, Radicular (Bilateral) (L>R); Cervical facet syndrome (Bilateral) (L>R); Cervical (3-4 mm) Anterolisthesis of C4 on C5; Cervical foraminal stenosis (Bilateral); Spondylosis without myelopathy or radiculopathy, lumbosacral region; Chronic shoulders pain (Bilateral) (L>R); Osteoarthritis of shoulder (Bilateral); Osteoarthritis of hip (Bilateral); Chronic shoulder pain after shoulder replacement (Bilateral) (L>R); Trigger finger, left middle finger; Cervicalgia; Greater trochanteric bursitis of hip (Left); Other specified dorsopathies, sacral and sacrococcygeal region; Chronic hip pain (Left); Osteoarthritis of hip (Left); and Abnormal MRI, lumbar spine (11/03/2019) on their pertinent problem list. Pain Assessment: Severity of Chronic pain is reported as a 6 /10. Location: Back Lower/radiates into both hips, left is worse. Onset: More than a month ago. Quality: Discomfort, Constant, Aching. Timing: Constant. Modifying factor(s): lying down resting. Vitals:  height is '5\' 6"'  (1.676 m) and weight is 156 lb (70.8 kg). Her temperature is 97.3 F (36.3 C) (abnormal). Her blood pressure is 130/65 (abnormal) and her pulse is 62. Her respiration is 18 and oxygen saturation is 95%.    The patient indicates doing well with the current medication regimen. No adverse reactions or side effects reported to the medications.  She has been seen orthopedic surgeon who started her on some physical therapy for her low back pain and right shoulder pain.  She indicates that she will give that a try for a while and see if that can help her.  She was told by the surgeon that she is not a good candidate for decompression.  Pharmacotherapy Assessment   Analgesic: Oxycodone IR 20 mg,  1 tab PO q 8 hrs (60 mg/day of oxycodone) MME/day: 90 mg/day.   Monitoring: [place] PMP: PDMP reviewed during this encounter.  Pharmacotherapy: No side-effects or adverse reactions reported. Compliance: No problems identified. Effectiveness: Clinically acceptable.  Dewayne Shorter, RN  03/17/2020 11:12 AM  Signed Nursing Pain Medication Assessment:  Safety precautions to be maintained throughout the outpatient stay will include: orient to surroundings, keep bed in low position, maintain call bell within reach at all times, provide assistance with transfer out of bed and ambulation.  Medication Inspection Compliance: Pill count conducted under aseptic conditions, in front of the patient. Neither the pills nor the bottle was removed from the patient's sight at any time. Once count was completed pills were immediately returned to the patient in their original bottle.  Medication: Oxycodone IR Pill/Patch Count: 9 of 90 pills remain Pill/Patch Appearance: Markings consistent with prescribed medication Bottle Appearance: Standard pharmacy container. Clearly labeled. Filled Date: 07 / 07/ 2021 Last Medication intake:  Today    UDS:  Summary  Date Value Ref Range Status  02/19/2020 Note  Final    Comment:    ==================================================================== [employer] [facility] (MW) ==================================================================== Test                             Result       Flag       Units  Drug Present and Declared for Prescription Verification   Oxycodone                      4108         EXPECTED   ng/mg creat   Oxymorphone                    1875         EXPECTED   ng/mg creat   Noroxycodone                   >7353        EXPECTED   ng/mg creat   Noroxymorphone                 835          EXPECTED   ng/mg creat    Sources of oxycodone are scheduled prescription medications.    Oxymorphone, noroxycodone, and noroxymorphone are expected     metabolites of oxycodone. Oxymorphone is also available as a    scheduled prescription medication.  ==================================================================== Test                      Result    Flag   Units      Ref Range   Creatinine              136              mg/dL      >=20 ==================================================================== Declared Medications:  The flagging and interpretation on this report are based on the  following declared medications.  Unexpected results may arise from  inaccuracies in the declared medications.   **Note: The testing scope of this panel includes these medications:   Oxycodone   **Note: The testing scope of this panel does not include the  following reported medications:   Acetaminophen (Tylenol)  Albuterol  Apixaban (Eliquis)  Bisacodyl  Calcium  Furosemide (Lasix)  Helium  Levocetirizine (Xyzal)  Lisinopril (Zestril)  Melatonin  Oxygen  Polyethylene Glycol (MiraLAX)  Ropinirole (Requip)  Supplement  Vitamin D ==================================================================== For clinical consultation, please call [phone]. ====================================================================  ROS  Constitutional: Denies any fever or chills Gastrointestinal: No reported hemesis, hematochezia, vomiting, or acute GI distress Musculoskeletal: Denies any acute onset joint swelling, redness, loss of ROM, or weakness Neurological: No reported episodes of acute onset apraxia, aphasia, dysarthria, agnosia, amnesia, paralysis, loss of coordination, or loss of consciousness  Medication Review  Benefiber, Calcium Carbonate-Vitamin D, Melatonin, Oxycodone HCl, Oxygen-Helium, Vitamin D, acetaminophen, albuterol, apixaban, bisacodyl, furosemide, levocetirizine, lisinopril, polyethylene glycol, and rOPINIRole  History Review  Allergy: Kristin [name] is allergic to flexeril [cyclobenzaprine hcl] and vancomycin. Drug:  Kristin [name]  reports no history of drug use. Alcohol:  reports no history of alcohol use. Tobacco:  reports that she quit smoking about 21 years ago. Her smoking use included cigarettes. She has a 50.00 pack-year smoking history. She has quit using smokeless tobacco. Social: Kristin [name]  reports that she quit smoking about 21 years ago. Her smoking use included cigarettes. She has a 50.00 pack-year smoking history. She has quit using smokeless tobacco. She reports that she does not drink alcohol and does not use drugs. Medical:  has a past medical history of Acute postoperative pain (02/09/2018), Arrhythmia, sinus node (04/03/2014), Arthritis, Arthritis, Atrial fibrillation ([place]), Back pain, Bradycardia, Breast cancer ([place]) (2004), Cancer ([place]) (2004), CHF (congestive heart failure) ([place]), Chronic back pain, CKD (chronic kidney disease), COPD (chronic obstructive pulmonary disease) ([place]), Cystocele, Decubitus ulcers, Dehydration, Gout, Hematuria, Hepatitis C, Hyperlipidemia, Hypertension, Hypomagnesemia (06/25/2015), OAB (overactive bladder), Overactive bladder, Restless leg, Shoulder pain, bilateral, Urinary frequency, UTI (lower urinary tract infection), and Vaginal atrophy. Surgical: Kristin [name]  has a past surgical history that includes Abdominal hysterectomy; Back surgery; Rotator cuff repair; Breast surgery; Cataract extraction w/PHACO (10/19/2011); Cataract extraction w/PHACO (11/02/2011); Carpal tunnel release; Joint replacement; Colon surgery; Pacemaker insertion (N/A, 12/24/2015); Cholecystectomy; Breast lumpectomy (Right, 2004); and Insert / replace / remove pacemaker. Family: family history includes Breast cancer in her paternal aunt; Breast cancer (age of onset: 77) in her sister; Cancer in her sister; Heart disease in her father and mother; Kidney disease in her brother; Stroke in her mother.  Laboratory Chemistry Profile   Renal Lab Results  Component Value Date   BUN 27 (H) 05/08/2019    CREATININE 1.22 (H) 05/08/2019   BCR 22 05/08/2019   GFRAA 46 (L) 05/08/2019   GFRNONAA 40 (L) 05/08/2019     Hepatic Lab Results  Component Value Date   AST 21 07/25/2018   ALT 11 07/25/2018   ALBUMIN 3.6 08/22/2017   ALKPHOS 60 08/22/2017     Electrolytes Lab Results  Component Value Date   NA 137 05/08/2019   K 5.0 05/08/2019   CL 109 05/08/2019   CALCIUM 10.1 05/08/2019   MG 2.2 09/24/2015     Bone No results found for: VD25OH, VD125OH2TOT, LX7262MB5, DH7416LA4, 25OHVITD1, 25OHVITD2, 25OHVITD3, TESTOFREE, TESTOSTERONE   Inflammation (CRP: Acute Phase) (ESR: Chronic Phase) Lab Results  Component Value Date   CRP <0.5 09/24/2015   ESRSEDRATE 12 09/24/2015       Note: Above Lab results reviewed.  Recent Imaging Review  DG PAIN CLINIC C-ARM 1-60 MIN NO REPORT Fluoro was used, but no Radiologist interpretation will be provided.  Please refer to "NOTES" tab for provider progress note. Note: Reviewed        Physical Exam  General appearance: Well nourished, well developed, and well hydrated. In no apparent acute distress Mental status: Alert, oriented x 3 (person, place, & time)       Respiratory: No evidence of acute respiratory distress  Eyes: PERLA Vitals: BP (!) 130/65   Pulse 62   Temp (!) 97.3 F (36.3 C)   Resp 18   Ht '5\' 6"'  (1.676 m)   Wt 156 lb (70.8 kg)   SpO2 95%   BMI 25.18 kg/m  BMI: Estimated body mass index is 25.18 kg/m as calculated from the following:   Height as of this encounter: '5\' 6"'  (1.676 m).   Weight as of this encounter: 156 lb (70.8 kg). Ideal: Ideal body weight: 59.3 kg (130 lb 11.7 oz) Adjusted ideal body weight: 63.9 kg (140 lb 13.4 oz)  Assessment   Status Diagnosis  Controlled Controlled Controlled 1. Chronic pain syndrome   2. Chronic low back pain (Primary Area of Pain) (Bilateral) (L>R)   3. Chronic lower extremity pain (Secondary Area of Pain) (Bilateral) (L>R)   4. Chronic cervical radicular pain (Tertiary Area of  Pain) (Bilateral) (L>R)   5. Pharmacologic therapy   6. Chronic anticoagulation (Eliquis)   7. Insomnia, unspecified type      Updated Problems: No problems updated.  Plan of Care  Problem-specific:  No problem-specific Assessment & Plan notes found for this encounter.  Kristin [name] has a current medication list which includes the following long-term medication(s): albuterol, apixaban, bisacodyl, furosemide, levocetirizine, lisinopril, [START ON 05/28/2020] melatonin, [START ON 03/28/2020] oxycodone hcl, [START ON 04/27/2020] oxycodone hcl, [START ON 05/27/2020] oxycodone hcl, polyethylene glycol, ropinirole, and benefiber.  Pharmacotherapy (Medications Ordered): Meds ordered this encounter  Medications  . Oxycodone HCl 20 MG TABS    Sig: Take 1 tablet (20 mg total) by mouth every 8 (eight) hours as needed. Must last 30 days    Dispense:  90 tablet    Refill:  0    Chronic Pain: STOP Act (Not applicable) Fill 1 day early if closed on refill date. Do not fill until: 03/28/2020. To last until: 04/27/2020. Avoid benzodiazepines within 8 hours of opioids  . Oxycodone HCl 20 MG TABS    Sig: Take 1 tablet (20 mg total) by mouth every 8 (eight) hours as needed. Must last 30 days    Dispense:  90 tablet    Refill:  0    Chronic Pain: STOP Act (Not applicable) Fill 1 day early if closed on refill date. Do not fill until: 04/27/2020. To last until: 05/27/2020. Avoid benzodiazepines within 8 hours of opioids  . Oxycodone HCl 20 MG TABS    Sig: Take 1 tablet (20 mg total) by mouth every 8 (eight) hours as needed. Must last 30 days    Dispense:  90 tablet    Refill:  0    Chronic Pain: STOP Act (Not applicable) Fill 1 day early if closed on refill date. Do not fill until: 05/27/2020. To last until: 06/26/2020. Avoid benzodiazepines within 8 hours of opioids  . Melatonin 10 MG CAPS    Sig: Take 10 mg by mouth at bedtime.    Dispense:  30 capsule    Refill:  5    Fill one day early if pharmacy is closed  on scheduled refill date. May substitute for generic or OTC, if available.   Orders:  No orders of the defined types were placed in this encounter.  Follow-up plan:   Return in about 3 months (around 06/25/2020) for 20-min, F2F, MM (on eval day).      Interventional management options:  Considering:   NOTE: ELIQUIS ANTICOAGULATION (Stop: x3 days prior to procedure  Restart 6 hours after procedures) Diagnostic right  SI joint block Possible bilateral SI joint RFA Diagnostic bilateral femoral nerve + obturator NB Possible bilateral femoral nerve + obturator nerve RFA Diagnostic right IA shoulder joint injection   Palliative PRN treatment(s):   Diagnostic left SI joint block #2  Palliative left CESI #3  Palliative left trochanteric bursa injection #2  Palliative right IA hip inj. #2  (100% on the right) Palliative left IA hip inj. #3   Palliative right lumbar facet block #3 Palliative left lumbar facet block #5 Palliative right lumbar facet RFA #2 (Last done on 09/19/18) Palliative left lumbar facet RFA #4 (Last done on 08/01/18) Palliative bilateral suprascapular nerve block Palliative bilateral suprascapular nerve RFA #2(Last done 07/13/18)      Recent Visits Date Type Provider Dept  02/14/20 Telemedicine Milinda Pointer, [facility]  01/31/20 Procedure visit Milinda Pointer, MD [facility]  01/23/20 Telemedicine Milinda Pointer, MD [facility]  01/10/20 Procedure visit Milinda Pointer, MD [facility]  12/20/19 Telemedicine Milinda Pointer, MD [facility]  Showing recent visits within past 90 days and meeting all other requirements Today's Visits Date Type Provider Dept  03/17/20 Office Visit Milinda Pointer, MD [facility]  Showing today's visits and meeting all other requirements Future Appointments Date Type Provider Dept  06/04/20 Appointment Milinda Pointer, MD [facility]  Showing future appointments within next 90 days and meeting all other requirements  I discussed the assessment and treatment plan with the patient. The patient was provided an opportunity to ask questions and all were answered. The patient agreed with the plan and demonstrated an understanding of the instructions.  Patient advised to call back or seek an in-person evaluation if the symptoms or condition worsens.  Duration of encounter: 30 minutes.  Note by: Gaspar Cola, MD Date: 03/17/2020; Time: 4:20 PM

## 2020-03-17 ENCOUNTER — Encounter: Payer: Self-pay | Admitting: Pain Medicine

## 2020-03-17 ENCOUNTER — Other Ambulatory Visit: Payer: Self-pay

## 2020-03-17 ENCOUNTER — Ambulatory Visit: Payer: Medicare HMO | Attending: Pain Medicine | Admitting: Pain Medicine

## 2020-03-17 VITALS — BP 130/65 | HR 62 | Temp 97.3°F | Resp 18 | Ht 66.0 in | Wt 156.0 lb

## 2020-03-17 DIAGNOSIS — M79605 Pain in left leg: Secondary | ICD-10-CM

## 2020-03-17 DIAGNOSIS — Z7901 Long term (current) use of anticoagulants: Secondary | ICD-10-CM | POA: Insufficient documentation

## 2020-03-17 DIAGNOSIS — G8929 Other chronic pain: Secondary | ICD-10-CM

## 2020-03-17 DIAGNOSIS — G894 Chronic pain syndrome: Secondary | ICD-10-CM | POA: Insufficient documentation

## 2020-03-17 DIAGNOSIS — M5441 Lumbago with sciatica, right side: Secondary | ICD-10-CM

## 2020-03-17 DIAGNOSIS — G4700 Insomnia, unspecified: Secondary | ICD-10-CM | POA: Insufficient documentation

## 2020-03-17 DIAGNOSIS — M5412 Radiculopathy, cervical region: Secondary | ICD-10-CM | POA: Diagnosis not present

## 2020-03-17 DIAGNOSIS — M5442 Lumbago with sciatica, left side: Secondary | ICD-10-CM | POA: Diagnosis not present

## 2020-03-17 DIAGNOSIS — M79604 Pain in right leg: Secondary | ICD-10-CM | POA: Diagnosis not present

## 2020-03-17 DIAGNOSIS — Z79899 Other long term (current) drug therapy: Secondary | ICD-10-CM | POA: Diagnosis not present

## 2020-03-17 MED ORDER — MELATONIN 10 MG PO CAPS: 10.0000 mg | ORAL_CAPSULE | Freq: Every day | ORAL | 5 refills | Status: DC

## 2020-03-17 MED ORDER — OXYCODONE HCL 20 MG PO TABS: 20.0000 mg | ORAL_TABLET | Freq: Three times a day (TID) | ORAL | 0 refills | Status: DC | PRN

## 2020-03-17 NOTE — Progress Notes (Signed)
Nursing Pain Medication Assessment:  Safety precautions to be maintained throughout the outpatient stay will include: orient to surroundings, keep bed in low position, maintain call bell within reach at all times, provide assistance with transfer out of bed and ambulation.  Medication Inspection Compliance: Pill count conducted under aseptic conditions, in front of the patient. Neither the pills nor the bottle was removed from the patient's sight at any time. Once count was completed pills were immediately returned to the patient in their original bottle.  Medication: Oxycodone IR Pill/Patch Count: 9 of 90 pills remain Pill/Patch Appearance: Markings consistent with prescribed medication Bottle Appearance: Standard pharmacy container. Clearly labeled. Filled Date: 07 / 07/ 2021 Last Medication intake:  Today

## 2020-03-17 NOTE — Patient Instructions (Addendum)
____________________________________________________________________________________________  Drug Holidays (Slow)  What is a "Drug Holiday"? Drug Holiday: is the name given to the period of time during which a patient stops taking a medication(s) for the purpose of eliminating tolerance to the drug.  Benefits . Improved effectiveness of opioids. . Decreased opioid dose needed to achieve benefits. . Improved pain with lesser dose.  What is tolerance? Tolerance: is the progressive decreased in effectiveness of a drug due to its repetitive use. With repetitive use, the body gets use to the medication and as a consequence, it loses its effectiveness. This is a common problem seen with opioid pain medications. As a result, a larger dose of the drug is needed to achieve the same effect that used to be obtained with a smaller dose.  How long should a "Drug Holiday" last? You should stay off of the pain medicine for at least 14 consecutive days. (2 weeks)  Should I stop the medicine "cold [place]"? No. You should always coordinate with your Pain Specialist so that he/she can provide you with the correct medication dose to make the transition as smoothly as possible.  How do I stop the medicine? Slowly. You will be instructed to decrease the daily amount of pills that you take by one (1) pill every seven (7) days. This is called a "slow downward taper" of your dose. For example: if you normally take four (4) pills per day, you will be asked to drop this dose to three (3) pills per day for seven (7) days, then to two (2) pills per day for seven (7) days, then to one (1) per day for seven (7) days, and at the end of those last seven (7) days, this is when the "Drug Holiday" would start.   Will I have withdrawals? By doing a "slow downward taper" like this one, it is unlikely that you will experience any significant withdrawal symptoms. Typically, what triggers withdrawals is the sudden stop of a high  dose opioid therapy. Withdrawals can usually be avoided by slowly decreasing the dose over a prolonged period of time. If you do not follow these instructions and decide to stop your medication abruptly, withdrawals may be possible.  What are withdrawals? Withdrawals: refers to the wide range of symptoms that occur after stopping or dramatically reducing opiate drugs after heavy and prolonged use. Withdrawal symptoms do not occur to patients that use low dose opioids, or those who take the medication sporadically. Contrary to benzodiazepine (example: Valium, Xanax, etc.) or alcohol withdrawals ("Delirium Tremens"), opioid withdrawals are not lethal. Withdrawals are the physical manifestation of the body getting rid of the excess receptors.  Expected Symptoms Early symptoms of withdrawal may include: . Agitation . Anxiety . Muscle aches . Increased tearing . Insomnia . Runny nose . Sweating . Yawning  Late symptoms of withdrawal may include: . Abdominal cramping . Diarrhea . Dilated pupils . Goose bumps . Nausea . Vomiting  Will I experience withdrawals? Due to the slow nature of the taper, it is very unlikely that you will experience any.  What is a slow taper? Taper: refers to the gradual decrease in dose.  (Last update: 03/12/2020) ____________________________________________________________________________________________    ____________________________________________________________________________________________  Medication Rules  Purpose: To inform patients, and their family members, of our rules and regulations.  Applies to: All patients receiving prescriptions (written or electronic).  Pharmacy of record: Pharmacy where electronic prescriptions will be sent. If written prescriptions are taken to a different pharmacy, please inform the nursing staff. The pharmacy  listed in the electronic medical record should be the one where you would like electronic prescriptions  to be sent.  Electronic prescriptions: In compliance with the [place] (STOP) Act of 2017 (Session Lanny Cramp [phone]), effective August 23, 2018, all controlled substances must be electronically prescribed. Calling prescriptions to the pharmacy will cease to exist.  Prescription refills: Only during scheduled appointments. Applies to all prescriptions.  NOTE: The following applies primarily to controlled substances (Opioid* Pain Medications).   Type of encounter (visit): For patients receiving controlled substances, face-to-face visits are required. (Not an option or up to the patient.)  Patient's responsibilities: 1. Pain Pills: Bring all pain pills to every appointment (except for procedure appointments). 2. Pill Bottles: Bring pills in original pharmacy bottle. Always bring the newest bottle. Bring bottle, even if empty. 3. Medication refills: You are responsible for knowing and keeping track of what medications you take and those you need refilled. The day before your appointment: write a list of all prescriptions that need to be refilled. The day of the appointment: give the list to the admitting nurse. Prescriptions will be written only during appointments. No prescriptions will be written on procedure days. If you forget a medication: it will not be "Called in", "Faxed", or "electronically sent". You will need to get another appointment to get these prescribed. No early refills. Do not call asking to have your prescription filled early. 4. Prescription Accuracy: You are responsible for carefully inspecting your prescriptions before leaving our office. Have the discharge nurse carefully go over each prescription with you, before taking them home. Make sure that your name is accurately spelled, that your address is correct. Check the name and dose of your medication to make sure it is accurate. Check the number of pills, and the written instructions to  make sure they are clear and accurate. Make sure that you are given enough medication to last until your next medication refill appointment. 5. Taking Medication: Take medication as prescribed. When it comes to controlled substances, taking less pills or less frequently than prescribed is permitted and encouraged. Never take more pills than instructed. Never take medication more frequently than prescribed.  6. Inform other Doctors: Always inform, all of your healthcare providers, of all the medications you take. 7. Pain Medication from other Providers: You are not allowed to accept any additional pain medication from any other Doctor or Healthcare provider. There are two exceptions to this rule. (see below) In the event that you require additional pain medication, you are responsible for notifying us, as stated below. 8. Medication Agreement: You are responsible for carefully reading and following our Medication Agreement. This must be signed before receiving any prescriptions from our practice. Safely store a copy of your signed Agreement. Violations to the Agreement will result in no further prescriptions. (Additional copies of our Medication Agreement are available upon request.) 9. Laws, Rules, & Regulations: All patients are expected to follow all Federal and [employer], [employer], Rules, [employer]. Ignorance of the Laws does not constitute a valid excuse.  10. Illegal drugs and Controlled Substances: The use of illegal substances (including, but not limited to marijuana and its derivatives) and/or the illegal use of any controlled substances is strictly prohibited. Violation of this rule may result in the immediate and permanent discontinuation of any and all prescriptions being written by our practice. The use of any illegal substances is prohibited. 11. Adopted CDC guidelines & recommendations: Target dosing levels will be at or  below 60 MME/day. Use of benzodiazepines** is not  recommended.  Exceptions: There are only two exceptions to the rule of not receiving pain medications from other Healthcare Providers. 1. Exception #1 (Emergencies): In the event of an emergency (i.e.: accident requiring emergency care), you are allowed to receive additional pain medication. However, you are responsible for: As soon as you are able, call our office [phone], at any time of the day or night, and leave a message stating your name, the date and nature of the emergency, and the name and dose of the medication prescribed. In the event that your call is answered by a member of our staff, make sure to document and save the date, time, and the name of the person that took your information.  2. Exception #2 (Planned Surgery): In the event that you are scheduled by another doctor or dentist to have any type of surgery or procedure, you are allowed (for a period no longer than 30 days), to receive additional pain medication, for the acute post-op pain. However, in this case, you are responsible for picking up a copy of our "Post-op Pain Management for Surgeons" handout, and giving it to your surgeon or dentist. This document is available at our office, and does not require an appointment to obtain it. Simply go to our office during business hours (Monday-Thursday from 8:00 AM to 4:00 PM) (Friday 8:00 AM to 12:00 Noon) or if you have a scheduled appointment with [name], prior to your surgery, and ask for it by name. In addition, you will need to provide [name] with your name, name of your surgeon, type of surgery, and date of procedure or surgery.  *Opioid medications include: morphine, codeine, oxycodone, oxymorphone, hydrocodone, hydromorphone, meperidine, tramadol, tapentadol, buprenorphine, fentanyl, methadone. **Benzodiazepine medications include: diazepam (Valium), alprazolam (Xanax), clonazepam (Klonopine), lorazepam (Ativan), clorazepate (Tranxene), chlordiazepoxide (Librium), estazolam (Prosom),  oxazepam (Serax), temazepam (Restoril), triazolam (Halcion) (Last updated: 10/20/2017) ____________________________________________________________________________________________   ____________________________________________________________________________________________  Medication Recommendations and Reminders  Applies to: All patients receiving prescriptions (written and/or electronic).  Medication Rules & Regulations: These rules and regulations exist for your safety and that of others. They are not flexible and neither are we. Dismissing or ignoring them will be considered "non-compliance" with medication therapy, resulting in complete and irreversible termination of such therapy. (See document titled "Medication Rules" for more details.) In all conscience, because of safety reasons, we cannot continue providing a therapy where the patient does not follow instructions.  Pharmacy of record:   Definition: This is the pharmacy where your electronic prescriptions will be sent.   We do not endorse any particular pharmacy, however, we have experienced problems with [facility] not securing enough medication supply for the community.  We do not restrict you in your choice of pharmacy. However, once we write for your prescriptions, we will NOT be re-sending more prescriptions to fix restricted supply problems created by your pharmacy, or your insurance.   The pharmacy listed in the electronic medical record should be the one where you want electronic prescriptions to be sent.  If you choose to change pharmacy, simply notify our nursing staff.  Recommendations:  Keep all of your pain medications in a safe place, under lock and key, even if you live alone. We will NOT replace lost, stolen, or damaged medication.  After you fill your prescription, take 1 week's worth of pills and put them away in a safe place. You should keep a separate, properly labeled bottle for this purpose. The remainder  should be kept in the original bottle. Use this as your primary supply, until it runs out. Once it's gone, then you know that you have 1 week's worth of medicine, and it is time to come in for a prescription refill. If you do this correctly, it is unlikely that you will ever run out of medicine.  To make sure that the above recommendation works, it is very important that you make sure your medication refill appointments are scheduled at least 1 week before you run out of medicine. To do this in an effective manner, make sure that you do not leave the office without scheduling your next medication management appointment. Always ask the nursing staff to show you in your prescription , when your medication will be running out. Then arrange for the receptionist to get you a return appointment, at least 7 days before you run out of medicine. Do not wait until you have 1 or 2 pills left, to come in. This is very poor planning and does not take into consideration that we may need to cancel appointments due to bad weather, sickness, or emergencies affecting our staff.  DO NOT ACCEPT A "Partial Fill": If for any reason your pharmacy does not have enough pills/tablets to completely fill or refill your prescription, do not allow for a "partial fill". The law allows the pharmacy to complete that prescription within 72 hours, without requiring a new prescription. If they do not fill the rest of your prescription within those 72 hours, you will need a separate prescription to fill the remaining amount, which we will NOT provide. If the reason for the partial fill is your insurance, you will need to talk to the pharmacist about payment alternatives for the remaining tablets, but again, DO NOT ACCEPT A PARTIAL FILL, unless you can trust your pharmacist to obtain the remainder of the pills within 72 hours.  Prescription refills and/or changes in medication(s):   Prescription refills, and/or changes in dose or medication,  will be conducted only during scheduled medication management appointments. (Applies to both, written and electronic prescriptions.)  No refills on procedure days. No medication will be changed or started on procedure days. No changes, adjustments, and/or refills will be conducted on a procedure day. Doing so will interfere with the diagnostic portion of the procedure.  No phone refills. No medications will be "called into the pharmacy".  No Fax refills.  No weekend refills.  No Holliday refills.  No after hours refills.  Remember:  Business hours are:  Monday to Thursday 8:00 AM to 4:00 PM Provider's Schedule: Milinda Pointer, MD - Appointments are:  Medication management: Monday and Wednesday 8:00 AM to 4:00 PM Procedure day: Tuesday and Thursday 7:30 AM to 4:00 PM Gillis Santa, MD - Appointments are:  Medication management: Tuesday and Thursday 8:00 AM to 4:00 PM Procedure day: Monday and Wednesday 7:30 AM to 4:00 PM (Last update: 03/12/2020) ____________________________________________________________________________________________   ____________________________________________________________________________________________  CANNABIDIOL (AKA: CBD Oil or Pills)  Applies to: All patients receiving prescriptions of controlled substances (written and/or electronic).  General Information: Cannabidiol (CBD), a derivative of Marijuana, was discovered in 41. It is one of some 113 identified cannabinoids in cannabis (Marijuana) plants, accounting for up to 40% of the plant's extract. As of 2018, preliminary clinical research on cannabidiol included studies of anxiety, cognition, movement disorders, and pain.  Cannabidiol is consummed in multiple ways, including inhalation of cannabis smoke or vapor, as an aerosol spray into the cheek, and by mouth. It  may be supplied as CBD oil containing CBD as the active ingredient (no added tetrahydrocannabinol (THC) or terpenes), a full-plant  CBD-dominant hemp extract oil, capsules, dried cannabis, or as a liquid solution. CBD is thought not have the same psychoactivity as THC, and may affect the actions of THC. Studies suggest that CBD may interact with different biological targets, including cannabinoid receptors and other neurotransmitter receptors. As of 2018 the mechanism of action for its biological effects has not been determined.  In the [place], cannabidiol has a limited approval by the Food and Drug Administration (FDA) for treatment of only two types of epilepsy disorders. The side effects of long-term use of the drug include somnolence, decreased appetite, diarrhea, fatigue, malaise, weakness, sleeping problems, and others.  CBD remains a Schedule I drug prohibited for any use.  Legality: Some manufacturers ship CBD products nationally, an illegal action which the FDA has not enforced in 2018, with CBD remaining the subject of an FDA investigational new drug evaluation, and is not considered legal as a dietary supplement or food ingredient as of December 2018. Federal illegality has made it difficult historically to conduct research on CBD. CBD is openly sold in head shops and health food stores in some states where such sales have not been explicitly legalized.  Warning: Because it is not FDA approved for general use or treatment of pain, it is not required to undergo the same manufacturing controls as prescription drugs.  This means that the available cannabidiol (CBD) may be contaminated with THC.  If this is the case, it will trigger a positive urine drug screen (UDS) test for cannabinoids (Marijuana).  Because a positive UDS for illicit substances is a violation of our medication agreement, your opioid analgesics (pain medicine) may be permanently discontinued. (Last update: 03/12/2020) ____________________________________________________________________________________________    Intrathecal Pain Pump Information An  intrathecal pain pump is a pump that sends medicine into your spinal canal through a thin tube (catheter). You may need an intrathecal pain pump if you have long-term pain that cannot be managed in other ways. In some cases, this pump can be used to deliver medicine that treats long-term muscle spasms (spasticity).  If you are using the pump for only a short time, the pump may be outside your body.  For long-term use, the pump and the catheter are implanted inside your body. The pump is a small round disc and is usually placed under the skin of the abdomen. The catheter will run under your skin from your abdomen to your back. Your health care provider will program the pump to deliver pain medicine into your spinal canal continuously or at certain intervals. You may have a handheld device that allows you to give yourself a dose of pain medicine when needed. The pump runs on a battery. The battery will last 7-10 years, and then the pump will need to be replaced. What are the benefits of an intrathecal pain pump? Opioid pain medicines are strong medicines that are often used to control long-term and severe pain. When these medicines are placed in the fluid space (intrathecal space) that surrounds your spinal cord and brain, the medicine will block the pain signals that come from your body. This type of pump may be an option if you have long-term pain that cannot be managed with opioids that are taken by mouth or by injection. The pump may allow you to have better control of your pain with less medicine. What are the risks of an intrathecal  pain pump? Before you can have the pump placed, you will need to go through a pain control trial. Medicine will be placed into your intrathecal space to see if it controls your pain and to see how well you tolerate the medicine. You will need to have a surgical procedure to place the catheter and the pain pump. This surgery has some risks. There are also risks to long-term  use of opioids. Risks of pain pump implantation and use include:  Infection.  Bleeding.  Failure of the pump or the catheter.  Leaking of fluid from your spinal canal (cerebrospinal fluid leak).  A growth around the tip of the catheter inside your spinal canal (granuloma). This can put pressure on your spine.  A need to remove the pump if problems develop.  A need to replace the pump after 7-10 years. Risks of long-term opioid use include:  A breathing problem that prevents you from getting enough oxygen (respiratory depression).  Physical and psychological dependence.  Constipation or difficulty passing urine.  Nausea and vomiting.  Mental changes, including feeling high, dizzy, drowsy, confused, depressed, or anxious. How do I use the pump? Your health care provider will decide what type of pump is best for you. If you have a pump that gives a dose of medicine on demand, you may have a handheld device to trigger the release of your medicine. You will need to see your health care provider often to make sure the pump is programmed correctly for your pain. This programming may need to be changed over time. You will also need to have the pump resupplied with medicine. Your health care provider may do this at your routine visits. You should also know that:  Electronic devices or scans will not damage your pump.  You will need a device identification card to pass through security gates.  Your device will have an alarm to warn you of a malfunction or low battery power. The alarm will sound if you need a medicine refill. Contact your health care provider if your alarm sounds. Summary  An intrathecal pain pump is a pump that sends medicine into your spinal canal through a thin tube (catheter).  The pump is a small round disc and is usually placed under the skin of the abdomen.  This type of pump may be an option if you have long-term pain that cannot be managed with opioids that  are taken by mouth or by injection.  The pump may allow you to have better control of your pain with less medicine. This information is not intended to replace advice given to you by your health care provider. Make sure you discuss any questions you have with your health care provider. Document Revised: 06/12/2018 Document Reviewed: 06/12/2018 Elsevier Patient Education  2020 [employer].

## 2020-03-18 ENCOUNTER — Other Ambulatory Visit: Payer: Self-pay | Admitting: Pharmacy Technician

## 2020-03-18 NOTE — Patient Outreach (Signed)
[facility]) Care Management  03/18/2020  Kristin [name] [birth year] [id number]                                       Medication Assistance Referral  Referral From: [facility] Embedded RPh Dorthula Perfect   Medication/Company: Eliquis / BMS Patient application portion:  Mailed Provider application portion: Faxed  to Dr. Isaias Cowman Provider address/fax verified via: Office website     Follow up:  Will follow up with patient in 5-15 business days to confirm application(s) have been received.  [employer] P. Hilberto Burzynski, [place]  [phone]

## 2020-03-24 ENCOUNTER — Other Ambulatory Visit: Payer: Medicare Other

## 2020-03-25 DIAGNOSIS — I4891 Unspecified atrial fibrillation: Secondary | ICD-10-CM | POA: Diagnosis not present

## 2020-03-25 DIAGNOSIS — I4892 Unspecified atrial flutter: Secondary | ICD-10-CM | POA: Diagnosis not present

## 2020-03-25 DIAGNOSIS — Z79899 Other long term (current) drug therapy: Secondary | ICD-10-CM | POA: Diagnosis not present

## 2020-03-25 DIAGNOSIS — I495 Sick sinus syndrome: Secondary | ICD-10-CM | POA: Diagnosis not present

## 2020-03-25 DIAGNOSIS — I5032 Chronic diastolic (congestive) heart failure: Secondary | ICD-10-CM | POA: Diagnosis not present

## 2020-03-26 DIAGNOSIS — M6281 Muscle weakness (generalized): Secondary | ICD-10-CM | POA: Diagnosis not present

## 2020-03-26 DIAGNOSIS — M2560 Stiffness of unspecified joint, not elsewhere classified: Secondary | ICD-10-CM | POA: Diagnosis not present

## 2020-03-26 DIAGNOSIS — G8929 Other chronic pain: Secondary | ICD-10-CM | POA: Diagnosis not present

## 2020-03-26 DIAGNOSIS — M545 Low back pain: Secondary | ICD-10-CM | POA: Diagnosis not present

## 2020-03-27 ENCOUNTER — Other Ambulatory Visit: Payer: Self-pay | Admitting: Pharmacy Technician

## 2020-03-27 NOTE — Patient Outreach (Signed)
[facility] [facility]) Care Management  03/27/2020  SKYLLAR NOTARIANNI Jul 14, 1930 [id number]   Successful call placed to patient regarding patient assistance application(s) for Eliquis with BMS , HIPAA identifiers verified.   Patient informs she received the application and mailed it back to me this week.  Follow up:  Will route note to embedded [facility] R[facility] Harlow Asa for case closure if document(s) have not been received in the next 15 business days.  Tuere Nwosu P. [employer], [place]  [phone]

## 2020-03-28 ENCOUNTER — Telehealth: Payer: Self-pay

## 2020-03-31 ENCOUNTER — Ambulatory Visit: Payer: Self-pay | Admitting: Pharmacist

## 2020-03-31 ENCOUNTER — Encounter: Payer: Medicare Other | Admitting: Family Medicine

## 2020-03-31 DIAGNOSIS — I4892 Unspecified atrial flutter: Secondary | ICD-10-CM

## 2020-03-31 DIAGNOSIS — I4891 Unspecified atrial fibrillation: Secondary | ICD-10-CM

## 2020-03-31 NOTE — Chronic Care Management (AMB) (Signed)
Chronic Care Management   Follow Up Note   03/31/2020 Name: Kristin [name] MRN: [id number] DOB: [birth year]  Referred by: Olin Hauser, DO Reason for referral : Care Coordination [facility])   Kristin [name] is a 86 y.o. year old female who is a primary care patient of Olin Hauser, DO. The CCM team was consulted for assistance with chronic disease management and care coordination needs.    Coordination of care with [facility].  Outpatient Encounter Medications as of 03/31/2020  Medication Sig Note  . acetaminophen (TYLENOL) 500 MG tablet Take 500 mg by mouth 3 (three) times daily.   Marland Kitchen albuterol (PROVENTIL) (2.5 MG/3ML) 0.083% nebulizer solution Take 3 mLs (2.5 mg total) by nebulization every 8 (eight) hours as needed for wheezing or shortness of breath.   Marland Kitchen apixaban (ELIQUIS) 5 MG TABS tablet Take 1 tablet (5 mg total) by mouth 2 (two) times daily.   . bisacodyl (DULCOLAX) 5 MG EC tablet Take 2 tablets (10 mg total) by mouth daily as needed for moderate constipation.   . Calcium Carbonate-Vitamin D 600-200 MG-UNIT TABS Take by mouth.   . Cholecalciferol (VITAMIN D) 2000 units tablet Take 2,000 Units by mouth daily.    . furosemide (LASIX) 20 MG tablet TAKE (1/2) TABLET BY MOUTH ONCE DAILY.   Marland Kitchen levocetirizine (XYZAL) 5 MG tablet Take 0.5 tablets (2.5 mg total) by mouth every other day.   . lisinopril (ZESTRIL) 10 MG tablet Take 1 tablet (10 mg total) by mouth daily.   Derrill Memo ON 05/28/2020] Melatonin 10 MG CAPS Take 10 mg by mouth at bedtime.   . Oxycodone HCl 20 MG TABS Take 1 tablet (20 mg total) by mouth every 8 (eight) hours as needed. Must last 30 days 03/17/2020: FUTURE Prescription. (NOT a DUPLICATE!!) >>>DO NOT DELETE<<< (even if Expired!) See Care Coordination Note from [facility] (Dr. Dossie Arbour)    . [START ON 04/27/2020] Oxycodone HCl 20 MG TABS Take 1 tablet (20 mg total) by mouth every 8 (eight) hours as needed. Must last  30 days 03/17/2020: FUTURE Prescription. (NOT a DUPLICATE!!) >>>DO NOT DELETE<<< (even if Expired!) See Care Coordination Note from [facility] (Dr. Dossie Arbour)  . [START ON 05/27/2020] Oxycodone HCl 20 MG TABS Take 1 tablet (20 mg total) by mouth every 8 (eight) hours as needed. Must last 30 days 03/17/2020: FUTURE Prescription. (NOT a DUPLICATE!!) >>>DO NOT DELETE<<< (even if Expired!) See Care Coordination Note from [facility] Management (Dr. Dossie Arbour)  . OXYGEN Inhale 2 L into the lungs at bedtime.  04/25/2018: At night as needed  . polyethylene glycol (MIRALAX / GLYCOLAX) 17 g packet Take 17 g by mouth daily as needed for moderate constipation. Must last 30 days 12/19/2019: PRN  . rOPINIRole (REQUIP) 2 MG tablet Take 2 tablets (4 mg total) by mouth at bedtime. Must last 30 days   . Wheat Dextrin (BENEFIBER) POWD Take 6 g by mouth 3 (three) times daily before meals. (2 tsp = 6 g) 12/19/2019: PRN   No facility-administered encounter medications on file as of 03/31/2020.    Goals Addressed            This Visit's Progress   . PharmD - Medication Assistance       CARE PLAN ENTRY (see longitudinal plan of care for additional care plan information)  Current Barriers:  . Financial Barriers in complicated patient with multiple medical conditions including hypertension, CKD, Chronic heart failure with preserved ejection  fraction, COPD, atrial fibrillation, seasonal allergies, chronic pain, restless leg syndrome; patient has [name] and reports copay for Eliquis  is cost prohibitive at this time o Note [name] assisted patient with applying for and receiving medication assistance from Eliquis in 2020 calendar year  Pharmacist Clinical Goal(s):  Marland Kitchen Over the next 30 days, patient will work with PharmD and providers to relieve medication access concerns  Interventions: . Receive message from [name] letting me know that she has faxed provider portion of  Eliquis patient assistance paperwork to Dr. Saralyn Pilar' office three times, but has not yet received a response.  Marland Kitchen Place coordination of care message to [facility]. Advised by clinic staff that provider paperwork has been received and clinic planning to fax back to assistance program. Request clinic instead return completed paperwork to [name] at fax number: [phone]. . Will update [facility] . Inter-disciplinary care team collaboration (see longitudinal plan of care)  Patient Self Care Activities:  . Patient will provide necessary portions of application   Initial goal documentation and Please see past updates related to this goal by clicking on the "Past Updates" button in the selected goal         Plan  The care management team will reach out to the patient again over the next 30 days.   Harlow Asa, PharmD, [place] [employer] [phone]

## 2020-04-01 ENCOUNTER — Ambulatory Visit: Payer: Medicare Other

## 2020-04-01 ENCOUNTER — Other Ambulatory Visit: Payer: Self-pay

## 2020-04-01 ENCOUNTER — Ambulatory Visit (INDEPENDENT_AMBULATORY_CARE_PROVIDER_SITE_OTHER): Payer: Medicare HMO | Admitting: Family Medicine

## 2020-04-01 ENCOUNTER — Other Ambulatory Visit: Payer: Medicare HMO

## 2020-04-01 ENCOUNTER — Encounter: Payer: Self-pay | Admitting: Family Medicine

## 2020-04-01 VITALS — BP 95/64 | HR 64 | Temp 96.4°F | Resp 16 | Ht 66.0 in | Wt 160.6 lb

## 2020-04-01 DIAGNOSIS — I129 Hypertensive chronic kidney disease with stage 1 through stage 4 chronic kidney disease, or unspecified chronic kidney disease: Secondary | ICD-10-CM | POA: Diagnosis not present

## 2020-04-01 DIAGNOSIS — E785 Hyperlipidemia, unspecified: Secondary | ICD-10-CM | POA: Diagnosis not present

## 2020-04-01 DIAGNOSIS — N1831 Chronic kidney disease, stage 3a: Secondary | ICD-10-CM

## 2020-04-01 DIAGNOSIS — I83223 Varicose veins of left lower extremity with both ulcer of ankle and inflammation: Secondary | ICD-10-CM | POA: Diagnosis not present

## 2020-04-01 DIAGNOSIS — E559 Vitamin D deficiency, unspecified: Secondary | ICD-10-CM

## 2020-04-01 DIAGNOSIS — D649 Anemia, unspecified: Secondary | ICD-10-CM | POA: Diagnosis not present

## 2020-04-01 DIAGNOSIS — F1120 Opioid dependence, uncomplicated: Secondary | ICD-10-CM

## 2020-04-01 DIAGNOSIS — I5032 Chronic diastolic (congestive) heart failure: Secondary | ICD-10-CM | POA: Diagnosis not present

## 2020-04-01 DIAGNOSIS — R7309 Other abnormal glucose: Secondary | ICD-10-CM

## 2020-04-01 DIAGNOSIS — I4891 Unspecified atrial fibrillation: Secondary | ICD-10-CM | POA: Diagnosis not present

## 2020-04-01 DIAGNOSIS — R6889 Other general symptoms and signs: Secondary | ICD-10-CM | POA: Diagnosis not present

## 2020-04-01 DIAGNOSIS — Z0000 Encounter for general adult medical examination without abnormal findings: Secondary | ICD-10-CM

## 2020-04-01 DIAGNOSIS — L97321 Non-pressure chronic ulcer of left ankle limited to breakdown of skin: Secondary | ICD-10-CM | POA: Diagnosis not present

## 2020-04-01 DIAGNOSIS — I4892 Unspecified atrial flutter: Secondary | ICD-10-CM | POA: Diagnosis not present

## 2020-04-01 DIAGNOSIS — N183 Chronic kidney disease, stage 3 unspecified: Secondary | ICD-10-CM | POA: Diagnosis not present

## 2020-04-01 DIAGNOSIS — J432 Centrilobular emphysema: Secondary | ICD-10-CM

## 2020-04-01 DIAGNOSIS — K5903 Drug induced constipation: Secondary | ICD-10-CM

## 2020-04-01 MED ORDER — MUPIROCIN 2 % EX OINT: 1.0000 "application " | TOPICAL_OINTMENT | Freq: Two times a day (BID) | CUTANEOUS | 2 refills | Status: DC | PRN

## 2020-04-01 NOTE — Assessment & Plan Note (Signed)
Due for fasting lipid today Check labs, last panel 2018

## 2020-04-01 NOTE — Patient Instructions (Addendum)
Thank you for coming to the office today.  Please schedule and return for a NURSE ONLY VISIT for VACCINE - Approximately around September / October / November - Need High Dose Flu Vaccine  Try the topical ointment Mupirocin as needed for twice a day up to 7-10 days if skin sore on Left ankle still bothering you.  If spreading redness or pain or draining pus contact office sooner.  Keep doing ELEVATION and compression with legs to help swelling  Keep on medicines.  Use rescue inhaler if need  Please schedule a Follow-up Appointment to: Return in about 6 months (around 10/02/2020) for 6 month follow-up HTN, CKD, Arthritis, COPD.  If you have any other questions or concerns, please feel free to call the office or send a message through [place]. You may also schedule an earlier appointment if necessary.  Additionally, you may be receiving a survey about your experience at our office within a few days to 1 week by e-mail or mail. We value your feedback.  [name], DO [place]

## 2020-04-01 NOTE — Progress Notes (Signed)
Subjective:    [name], female    DOB: 1930/05/26, 86 y.o.   MRN: [id number]  Kristin [name] is a 86 y.o. female presenting on 04/01/2020 for Annual Exam   HPI   Here for Annual Physical and Lab Review.  CHRONIC HTN CKD III Reports checks her BP regularly at home on cuff, usually average is normal Current Meds - Lisinopril 10mg  daily, Furosemide 10mg  (Half of 20mg ) daily   Reports good compliance, took meds today. Tolerating well, w/o complaints. Lifestyle: - Diet: limited salt diet - Exercise: limited due to arthritis Admits occasional edema, mostly controlled Denies CP, dyspnea, HA, dizziness / lightheadedness  Chronic Pain Syndrome / Osteoarthritis multiple joints / Sacroiliac Pain / Lumbar Facet Syndrome / L Hip Pain Opioid Induced Constipation Opioid Dependence uncomplicated  Followed by [facility] Pain Management Dr Despina Hick injection therapy She is on Oxycodone 20mg  q 8 hr PRN with some good results, helps her function She uses cane to assist with ambulation.  Cardiology / Diastolic CHF Chronic / Atrial Flutter  With Atrial Fibrillation Followed by Dr Saralyn Pilar [facility] - chart review. Prior history reduced LVEF has improved, and has mild valvular insufficiency. History of Atrial Fib and Flutter in past minimally symptomatic and on Eliquis anticoagulation for primary prevention CVA. 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  Centrilobular Emphysema (COPD) / Former Smoker quit >30 years ago Not endorsing significant problem with emphysema. Currently breathing well, since former smoker. Not using inhaler. She has a nebulizer machine at home for PRN use albuterol only. Rarely uses.   Health Maintenance: UTD COVID19 vaccine. Due for Flu Vaccine, will return when available.  Depression screen [facility] 2/9 04/01/2020 03/17/2020 01/31/2020  Decreased Interest 0 0 0  Down, Depressed, Hopeless 0 0 0  PHQ - 2 Score 0 0 0  Altered sleeping - - -   Tired, decreased energy - - -  Change in appetite - - -  Feeling bad or failure about yourself  - - -  Trouble concentrating - - -  Moving slowly or fidgety/restless - - -  Suicidal thoughts - - -  PHQ-9 Score - - -  Difficult doing work/chores - - -  Some recent data might be hidden    Past Medical History:  Diagnosis Date  . Acute postoperative pain 02/09/2018  . Arrhythmia, sinus node 04/03/2014  . Arthritis   . Arthritis   . Atrial fibrillation ([place])   . Back pain    lower back chronic  . Bradycardia   . Breast cancer ([place]) 2004   right breast cancer  . Cancer [facility]) 2004   rt breast cancer-post lumpectomy- chemo/rad  . CHF (congestive heart failure) ([place])   . Chronic back pain   . CKD (chronic kidney disease)   . COPD (chronic obstructive pulmonary disease) (HCC)    wears O2 at 2L via  at night  . Cystocele   . Decubitus ulcers   . Dehydration   . Gout   . Hematuria   . Hepatitis C   . Hyperlipidemia   . Hypertension   . Hypomagnesemia 06/25/2015  . OAB (overactive bladder)   . Overactive bladder   . Restless leg   . Shoulder pain, bilateral   . Urinary frequency   . UTI (lower urinary tract infection)   . Vaginal atrophy    Past Surgical History:  Procedure Laterality Date  . ABDOMINAL HYSTERECTOMY    . BACK SURGERY    .  BREAST LUMPECTOMY Right 2004  . BREAST SURGERY     rt lumpectomy  . CARPAL TUNNEL RELEASE    . CATARACT EXTRACTION W/PHACO  10/19/2011   Procedure: CATARACT EXTRACTION PHACO AND INTRAOCULAR LENS PLACEMENT (IOC);  Surgeon: Elta Guadeloupe T. Gershon Crane, MD;  Location: [facility];  Service: Ophthalmology;  Laterality: Right;  CDE:10.81  . CATARACT EXTRACTION W/PHACO  11/02/2011   Procedure: CATARACT EXTRACTION PHACO AND INTRAOCULAR LENS PLACEMENT (IOC);  Surgeon: Elta Guadeloupe T. Gershon Crane, MD;  Location: [facility] ORS;  Service: Ophthalmology;  Laterality: Left;  CDE 8.60  . CHOLECYSTECTOMY     3/18  . COLON SURGERY    . INSERT / REPLACE / REMOVE PACEMAKER    . JOINT  REPLACEMENT     bilateral TKA  . PACEMAKER INSERTION N/A 12/24/2015   Procedure: INSERTION PACEMAKER;  Surgeon: Isaias Cowman, MD;  Location: [facility] ORS;  Service: Cardiovascular;  Laterality: N/A;  . ROTATOR CUFF REPAIR     bilateral   Social History   Socioeconomic History  . Marital status: Single    Spouse name: Not on file  . Number of children: 2  . Years of education: 36  . Highest education level: 12th grade  Occupational History  . Occupation: retired  Tobacco Use  . Smoking status: Former Smoker    Packs/day: 1.25    Years: 40.00    Pack years: 50.00    Types: Cigarettes    Quit date: 12/16/1998    Years since quitting: 21.3  . Smokeless tobacco: Former Network engineer  . Vaping Use: Never used  Substance and Sexual Activity  . Alcohol use: No    Alcohol/week: 0.0 standard drinks  . Drug use: No  . Sexual activity: Not Currently    Birth control/protection: Surgical  Other Topics Concern  . Not on file  Social History Narrative  . Not on file   Social Determinants of Health   Financial Resource Strain: Medium Risk  . Difficulty of Paying Living Expenses: Somewhat hard  Food Insecurity:   . Worried About Charity fundraiser in the Last Year:   . Arboriculturist in the Last Year:   Transportation Needs:   . Film/video editor (Medical):   Marland Kitchen Lack of Transportation (Non-Medical):   Physical Activity: Inactive  . Days of Exercise per Week: 0 days  . Minutes of Exercise per Session: 0 min  Stress:   . Feeling of Stress :   Social Connections:   . Frequency of Communication with Friends and Family:   . Frequency of Social Gatherings with Friends and Family:   . Attends Religious Services:   . Active Member of Clubs or Organizations:   . Attends Archivist Meetings:   Marland Kitchen Marital Status:   Intimate Partner Violence:   . Fear of Current or Ex-Partner:   . Emotionally Abused:   Marland Kitchen Physically Abused:   . Sexually Abused:    Family History   Problem Relation Age of Onset  . Kidney disease Brother   . Heart disease Father   . Heart disease Mother   . Stroke Mother   . Cancer Sister   . Breast cancer Sister 35  . Breast cancer Paternal Aunt   . Anesthesia problems Neg Hx   . Hypotension Neg Hx   . Malignant hyperthermia Neg Hx   . Pseudochol deficiency Neg Hx    Current Outpatient Medications on File Prior to Visit  Medication Sig  . acetaminophen (TYLENOL) 500  MG tablet Take 500 mg by mouth 3 (three) times daily.  Marland Kitchen albuterol (PROVENTIL) (2.5 MG/3ML) 0.083% nebulizer solution Take 3 mLs (2.5 mg total) by nebulization every 8 (eight) hours as needed for wheezing or shortness of breath.  Marland Kitchen apixaban (ELIQUIS) 5 MG TABS tablet Take 1 tablet (5 mg total) by mouth 2 (two) times daily.  . bisacodyl (DULCOLAX) 5 MG EC tablet Take 2 tablets (10 mg total) by mouth daily as needed for moderate constipation.  . Calcium Carbonate-Vitamin D 600-200 MG-UNIT TABS Take by mouth.  . Cholecalciferol (VITAMIN D) 2000 units tablet Take 2,000 Units by mouth daily.   . furosemide (LASIX) 20 MG tablet TAKE (1/2) TABLET BY MOUTH ONCE DAILY.  Marland Kitchen levocetirizine (XYZAL) 5 MG tablet Take 0.5 tablets (2.5 mg total) by mouth every other day.  . lisinopril (ZESTRIL) 10 MG tablet Take 1 tablet (10 mg total) by mouth daily.  Derrill Memo ON 05/28/2020] Melatonin 10 MG CAPS Take 10 mg by mouth at bedtime.  . OXYGEN Inhale 2 L into the lungs at bedtime.   . polyethylene glycol (MIRALAX / GLYCOLAX) 17 g packet Take 17 g by mouth daily as needed for moderate constipation. Must last 30 days  . rOPINIRole (REQUIP) 2 MG tablet Take 2 tablets (4 mg total) by mouth at bedtime. Must last 30 days  . Wheat Dextrin (BENEFIBER) POWD Take 6 g by mouth 3 (three) times daily before meals. (2 tsp = 6 g)  . Oxycodone HCl 20 MG TABS Take 1 tablet (20 mg total) by mouth every 8 (eight) hours as needed. Must last 30 days (Patient not taking: Reported on 04/01/2020)  . [START ON  04/27/2020] Oxycodone HCl 20 MG TABS Take 1 tablet (20 mg total) by mouth every 8 (eight) hours as needed. Must last 30 days (Patient not taking: Reported on 04/01/2020)  . [START ON 05/27/2020] Oxycodone HCl 20 MG TABS Take 1 tablet (20 mg total) by mouth every 8 (eight) hours as needed. Must last 30 days (Patient not taking: Reported on 04/01/2020)   No current facility-administered medications on file prior to visit.    Review of Systems  Constitutional: Negative for activity change, appetite change, chills, diaphoresis, fatigue and fever.  HENT: Negative for congestion and hearing loss.   Eyes: Negative for visual disturbance.  Respiratory: Negative for apnea, cough, choking, chest tightness, shortness of breath and wheezing.   Cardiovascular: Negative for chest pain, palpitations and leg swelling.  Gastrointestinal: Negative for abdominal pain, anal bleeding, blood in stool, constipation, diarrhea, nausea and vomiting.  Endocrine: Negative for cold intolerance.  Genitourinary: Negative for difficulty urinating, dysuria, frequency and hematuria.  Musculoskeletal: Positive for arthralgias and back pain. Negative for myalgias and neck pain.  Skin: Negative for rash.  Allergic/Immunologic: Negative for environmental allergies.  Neurological: Negative for dizziness, weakness, light-headedness, numbness and headaches.  Hematological: Negative for adenopathy.  Psychiatric/Behavioral: Negative for behavioral problems, dysphoric mood and sleep disturbance. The patient is not nervous/anxious.    Per HPI unless specifically indicated above       Objective:    BP 95/64   Pulse 64   Temp (!) 96.4 F (35.8 C) (Temporal)   Resp 16   Ht 5\' 6"  (1.676 m)   Wt 160 lb 9.6 oz (72.8 kg)   SpO2 100%   BMI 25.92 kg/m   Wt Readings from Last 3 Encounters:  04/01/20 160 lb 9.6 oz (72.8 kg)  03/17/20 156 lb (70.8 kg)  01/31/20 158 lb (71.7  kg)    Physical Exam Vitals and nursing note reviewed.    Constitutional:      General: She is not in acute distress.    Appearance: She is well-developed. She is not diaphoretic.     Comments: Well-appearing, comfortable, cooperative  HENT:     Head: Normocephalic and atraumatic.  Eyes:     General:        Right eye: No discharge.        Left eye: No discharge.     Conjunctiva/sclera: Conjunctivae normal.     Pupils: Pupils are equal, round, and reactive to light.  Neck:     Thyroid: No thyromegaly.  Cardiovascular:     Rate and Rhythm: Normal rate and regular rhythm.     Heart sounds: Normal heart sounds. No murmur heard.   Pulmonary:     Effort: Pulmonary effort is normal. No respiratory distress.     Breath sounds: Normal breath sounds. No wheezing or rales.  Abdominal:     General: Bowel sounds are normal. There is no distension.     Palpations: Abdomen is soft. There is no mass.     Tenderness: There is no abdominal tenderness.  Musculoskeletal:        General: No tenderness. Normal range of motion.     Cervical back: Normal range of motion and neck supple.     Right lower leg: Edema (localized to lower ankles/feet, varicose veins) present.     Left lower leg: Edema (localized to lower ankles/feet, varicose veins, left lateral ankle over malleoluls area has slight callus formation some mild ulceration superficial only over varicose vein) present.     Comments: Upper / Lower Extremities: - Normal muscle tone, strength bilateral upper extremities 5/5, lower extremities 5/5  Uses cane. Able to stand from seated.  Lymphadenopathy:     Cervical: No cervical adenopathy.  Skin:    General: Skin is warm and dry.     Findings: No erythema or rash.  Neurological:     Mental Status: She is alert and oriented to person, place, and time.     Comments: Distal sensation intact to light touch all extremities  Psychiatric:        Behavior: Behavior normal.     Comments: Well groomed, good eye contact, normal speech and thoughts         Results for orders placed or performed in visit on 02/14/20  ToxASSURE Select 13 (MW), Urine  Result Value Ref Range   Summary Note       Assessment & Plan:   Problem List Items Addressed This Visit    Varicose veins of left lower extremity with inflammation, with ulcer of ankle limited to breakdown of skin ([place])    Localized area L lower ankle/foot with superficial ulceration of varicose vein, no active open sore now, it is healed but has some callus formation. - Edema is managed at this time - No new complications  Trial on Mupirocin ointment PRN for skin sore Return criteria if worsening May consider vascular consultation      Relevant Medications   mupirocin ointment (BACTROBAN) 2 %   Uncomplicated opioid dependence ([place]) (Chronic)    Followed by pain management [facility] On opiate therapy chronically for pain to improve function      Opioid-induced constipation (OIC) (Chronic)    Stable, chronic problem On miralax      Hyperlipidemia    Due for fasting lipid today Check labs, last panel 2018  Chronic kidney disease (CKD), stage III (moderate)    See A&P for HTN w CKD Due for labs check chemistry      Centrilobular emphysema (HCC)    Stable without acute COPD exacerbation Former smoker >30 years ago Not on maintenance therapy Has nebulizer at home w/ albuterol      Benign hypertension with CKD (chronic kidney disease) stage III    Well-controlled HTN - Home BP readings reviewed  Complication with CKD III, dCHF    Plan:  1. Continue current BP regimen Lisinopril 10mg  daily, Furosemide 10mg  daily (half of 20mg ) 2. Encourage improved lifestyle - low sodium diet, regular exercise 3. Continue monitor BP outside office, bring readings to next visit, if persistently >140/90 or new symptoms notify office sooner       Other Visit Diagnoses    Annual physical exam    -  Primary       Updated Health Maintenance information - She will contact us with  COVID19 vaccination information to update the dates she received vaccines - Due for Flu shot in Fall/Winter 2021, will return.  Reviewed recent lab results with patient Encouraged improvement to lifestyle with diet and exercise Goal maintain healthy weight.   Meds ordered this encounter  Medications  . mupirocin ointment (BACTROBAN) 2 %    Sig: Apply 1 application topically 2 (two) times daily as needed. For up to 7-10 days as needed to help heal and prevent infection.    Dispense:  22 g    Refill:  2    Follow up plan: Return in about 6 months (around 10/02/2020) for 6 month follow-up HTN, CKD, Arthritis, COPD.  [name], [facility] 04/01/2020, 9:25 AM

## 2020-04-01 NOTE — Assessment & Plan Note (Addendum)
Stable without acute COPD exacerbation Former smoker >30 years ago Not on maintenance therapy Has nebulizer at home w/ albuterol

## 2020-04-01 NOTE — Assessment & Plan Note (Signed)
Stable, chronic problem On miralax

## 2020-04-01 NOTE — Assessment & Plan Note (Signed)
Well-controlled HTN - Home BP readings reviewed  Complication with CKD III, dCHF    Plan:  1. Continue current BP regimen Lisinopril 10mg  daily, Furosemide 10mg  daily (half of 20mg ) 2. Encourage improved lifestyle - low sodium diet, regular exercise 3. Continue monitor BP outside office, bring readings to next visit, if persistently >140/90 or new symptoms notify office sooner

## 2020-04-01 NOTE — Assessment & Plan Note (Signed)
Followed by pain management [facility] On opiate therapy chronically for pain to improve function

## 2020-04-01 NOTE — Assessment & Plan Note (Addendum)
Localized area L lower ankle/foot with superficial ulceration of varicose vein, no active open sore now, it is healed but has some callus formation. - Edema is managed at this time - No new complications  Trial on Mupirocin ointment PRN for skin sore Return criteria if worsening May consider vascular consultation

## 2020-04-01 NOTE — Assessment & Plan Note (Signed)
See A&P for HTN w CKD Due for labs check chemistry

## 2020-04-02 LAB — CBC WITH DIFFERENTIAL/PLATELET
Absolute Monocytes: 538 cells/uL (ref 200–950)
Basophils Absolute: 39 cells/uL (ref 0–200)
Basophils Relative: 0.7 %
Eosinophils Absolute: 319 cells/uL (ref 15–500)
Eosinophils Relative: 5.7 %
HCT: 40.4 % (ref 35.0–45.0)
Hemoglobin: 12.6 g/dL (ref 11.7–15.5)
Lymphs Abs: 2318 cells/uL (ref 850–3900)
MCH: 30.2 pg (ref 27.0–33.0)
MCHC: 31.2 g/dL — ABNORMAL LOW (ref 32.0–36.0)
MCV: 96.9 fL (ref 80.0–100.0)
MPV: 10.2 fL (ref 7.5–12.5)
Monocytes Relative: 9.6 %
Neutro Abs: 2386 cells/uL (ref 1500–7800)
Neutrophils Relative %: 42.6 %
Platelets: 146 10*3/uL (ref 140–400)
RBC: 4.17 10*6/uL (ref 3.80–5.10)
RDW: 12.5 % (ref 11.0–15.0)
Total Lymphocyte: 41.4 %
WBC: 5.6 10*3/uL (ref 3.8–10.8)

## 2020-04-02 LAB — HEMOGLOBIN A1C
Hgb A1c MFr Bld: 5.5 % of total Hgb (ref <5.7)
Mean Plasma Glucose: 111 (calc)
eAG (mmol/L): 6.2 (calc)

## 2020-04-02 LAB — COMPLETE METABOLIC PANEL WITH GFR
AG Ratio: 1.6 (calc) (ref 1.0–2.5)
ALT: 12 U/L (ref 6–29)
AST: 21 U/L (ref 10–35)
Albumin: 4.2 g/dL (ref 3.6–5.1)
Alkaline phosphatase (APISO): 63 U/L (ref 37–153)
BUN/Creatinine Ratio: 25 (calc) — ABNORMAL HIGH (ref 6–22)
BUN: 37 mg/dL — ABNORMAL HIGH (ref 7–25)
CO2: 24 mmol/L (ref 20–32)
Calcium: 9.8 mg/dL (ref 8.6–10.4)
Chloride: 106 mmol/L (ref 98–110)
Creat: 1.5 mg/dL — ABNORMAL HIGH (ref 0.60–0.88)
GFR, Est African American: 36 mL/min/{1.73_m2} — ABNORMAL LOW (ref >=60)
GFR, Est Non African American: 31 mL/min/{1.73_m2} — ABNORMAL LOW (ref >=60)
Globulin: 2.7 g/dL (calc) (ref 1.9–3.7)
Glucose, Bld: 75 mg/dL (ref 65–99)
Potassium: 4.7 mmol/L (ref 3.5–5.3)
Sodium: 140 mmol/L (ref 135–146)
Total Bilirubin: 0.6 mg/dL (ref 0.2–1.2)
Total Protein: 6.9 g/dL (ref 6.1–8.1)

## 2020-04-02 LAB — VITAMIN D 25 HYDROXY (VIT D DEFICIENCY, FRACTURES): Vit D, 25-Hydroxy: 31 ng/mL (ref 30–100)

## 2020-04-02 LAB — LIPID PANEL
Cholesterol: 199 mg/dL (ref <200)
HDL: 62 mg/dL (ref >=50)
LDL Cholesterol (Calc): 117 mg/dL (calc) — ABNORMAL HIGH
Non-HDL Cholesterol (Calc): 137 mg/dL (calc) — ABNORMAL HIGH (ref <130)
Total CHOL/HDL Ratio: 3.2 (calc) (ref <5.0)
Triglycerides: 92 mg/dL (ref <150)

## 2020-04-02 LAB — TSH: TSH: 1.65 mIU/L (ref 0.40–4.50)

## 2020-04-04 ENCOUNTER — Ambulatory Visit: Payer: Self-pay | Admitting: Pharmacist

## 2020-04-04 DIAGNOSIS — I129 Hypertensive chronic kidney disease with stage 1 through stage 4 chronic kidney disease, or unspecified chronic kidney disease: Secondary | ICD-10-CM

## 2020-04-04 NOTE — Patient Instructions (Signed)
Thank you allowing the Chronic Care Management Team to be a part of your care! It was a pleasure speaking with you today!     CCM (Chronic Care Management) Team    Noreene Larsson RN, MSN, CCM Nurse Care Coordinator  [phone]   Harlow Asa PharmD  Clinical Pharmacist  [phone]   Eula Fried LCSW Clinical Social Worker [phone]  Visit Information  Goals Addressed            This Visit's Progress   . PharmD - Medication Assistance       CARE PLAN ENTRY (see longitudinal plan of care for additional care plan information)  Current Barriers:  . Financial Barriers in complicated patient with multiple medical conditions including hypertension, CKD, Chronic heart failure with preserved ejection fraction, COPD, atrial fibrillation, seasonal allergies, chronic pain, restless leg syndrome; patient has [name] and reports copay for Eliquis  is cost prohibitive at this time o Note [place] assisted patient with applying for and receiving medication assistance from Eliquis in 2020 calendar year  Pharmacist Clinical Goal(s):  Marland Kitchen Over the next 30 days, patient will work with PharmD and providers to relieve medication access concerns  Interventions: . Perform chart review. Note patient seen by PCP on 8/10 . Follow up with patient regarding medication assistance.  o Reports mailed Eliquis patient assistance application back to [place] [facility] last week o Reports also received call from and went into [facility] to complete Eliquis assistance paperwork with this office as well. - CM Pharmacist has requested [facility] to fax completed paperwork to [facility] Susy Frizzle at fax number: [phone]. . Follow up with patient regarding importance of medication adherence ? Confirms using weekly pillbox as adherence aid . Encourage patient to continue to monitor home BP, keep log and bring record with her to medical  appointments . Inter-disciplinary care team collaboration (see longitudinal plan of care)  Patient Self Care Activities:  . Patient will provide necessary portions of application   Initial goal documentation and Please see past updates related to this goal by clicking on the "Past Updates" button in the selected goal         Patient verbalizes understanding of instructions provided today.   The care management team will reach out to the patient again over the next 30 days.   Harlow Asa, PharmD, [place] [employer] [phone]

## 2020-04-04 NOTE — Chronic Care Management (AMB) (Signed)
Chronic Care Management   Follow Up Note   04/04/2020 Name: Kristin [name] MRN: [id number] DOB: Feb 20, 1930  Referred by: Olin Hauser, DO Reason for referral : Chronic Care Management (Patient Phone Call)   Kristin [name] is a 86 y.o. year old female who is a primary care patient of Olin Hauser, DO. The CCM team was consulted for assistance with chronic disease management and care coordination needs.    I reached out to [name] by phone today.   Review of patient status, including review of consultants reports, relevant laboratory and other test results, and collaboration with appropriate care team members and the patient's provider was performed as part of comprehensive patient evaluation and provision of chronic care management services.      Outpatient Encounter Medications as of 04/04/2020  Medication Sig Note  . furosemide (LASIX) 20 MG tablet TAKE (1/2) TABLET BY MOUTH ONCE DAILY.   Marland Kitchen lisinopril (ZESTRIL) 10 MG tablet Take 1 tablet (10 mg total) by mouth daily.   Marland Kitchen acetaminophen (TYLENOL) 500 MG tablet Take 500 mg by mouth 3 (three) times daily.   Marland Kitchen albuterol (PROVENTIL) (2.5 MG/3ML) 0.083% nebulizer solution Take 3 mLs (2.5 mg total) by nebulization every 8 (eight) hours as needed for wheezing or shortness of breath.   Marland Kitchen apixaban (ELIQUIS) 5 MG TABS tablet Take 1 tablet (5 mg total) by mouth 2 (two) times daily.   . bisacodyl (DULCOLAX) 5 MG EC tablet Take 2 tablets (10 mg total) by mouth daily as needed for moderate constipation.   . Calcium Carbonate-Vitamin D 600-200 MG-UNIT TABS Take by mouth.   . Cholecalciferol (VITAMIN D) 2000 units tablet Take 2,000 Units by mouth daily.    Marland Kitchen levocetirizine (XYZAL) 5 MG tablet Take 0.5 tablets (2.5 mg total) by mouth every other day.   Derrill Memo ON 05/28/2020] Melatonin 10 MG CAPS Take 10 mg by mouth at bedtime.   . mupirocin ointment (BACTROBAN) 2 % Apply 1 application topically 2 (two) times daily as needed. For  up to 7-10 days as needed to help heal and prevent infection.   . Oxycodone HCl 20 MG TABS Take 1 tablet (20 mg total) by mouth every 8 (eight) hours as needed. Must last 30 days (Patient not taking: Reported on 04/01/2020) 03/17/2020: FUTURE Prescription. (NOT a DUPLICATE!!) >>>DO NOT DELETE<<< (even if Expired!) See Care Coordination Note from [facility] (Dr. Dossie Arbour)    . [START ON 04/27/2020] Oxycodone HCl 20 MG TABS Take 1 tablet (20 mg total) by mouth every 8 (eight) hours as needed. Must last 30 days (Patient not taking: Reported on 04/01/2020) 03/17/2020: FUTURE Prescription. (NOT a DUPLICATE!!) >>>DO NOT DELETE<<< (even if Expired!) See Care Coordination Note from [facility] (Dr. Dossie Arbour)  . [START ON 05/27/2020] Oxycodone HCl 20 MG TABS Take 1 tablet (20 mg total) by mouth every 8 (eight) hours as needed. Must last 30 days (Patient not taking: Reported on 04/01/2020) 03/17/2020: FUTURE Prescription. (NOT a DUPLICATE!!) >>>DO NOT DELETE<<< (even if Expired!) See Care Coordination Note from [facility] (Dr. Dossie Arbour)  . OXYGEN Inhale 2 L into the lungs at bedtime.  04/25/2018: At night as needed  . polyethylene glycol (MIRALAX / GLYCOLAX) 17 g packet Take 17 g by mouth daily as needed for moderate constipation. Must last 30 days 12/19/2019: PRN  . rOPINIRole (REQUIP) 2 MG tablet Take 2 tablets (4 mg total) by mouth at bedtime. Must last 30 days   . Wheat  Dextrin (BENEFIBER) POWD Take 6 g by mouth 3 (three) times daily before meals. (2 tsp = 6 g) 12/19/2019: PRN   No facility-administered encounter medications on file as of 04/04/2020.    Goals Addressed            This Visit's Progress   . PharmD - Medication Assistance       CARE PLAN ENTRY (see longitudinal plan of care for additional care plan information)  Current Barriers:  . Financial Barriers in complicated patient with multiple medical conditions including hypertension, CKD, Chronic heart failure with preserved  ejection fraction, COPD, atrial fibrillation, seasonal allergies, chronic pain, restless leg syndrome; patient has [name] and reports copay for Eliquis  is cost prohibitive at this time o Note [place] assisted patient with applying for and receiving medication assistance from Eliquis in 2020 calendar year  Pharmacist Clinical Goal(s):  Marland Kitchen Over the next 30 days, patient will work with PharmD and providers to relieve medication access concerns  Interventions: . Perform chart review. Note patient seen by PCP on 8/10 . Follow up with patient regarding medication assistance.  o Reports mailed Eliquis patient assistance application back to [place] last week o Reports also received call from and went into [facility] to complete Eliquis assistance paperwork with this office as well. - CM Pharmacist has requested [facility] to fax completed paperwork to [facility] CPhT Susy Frizzle at fax number: [phone]. . Follow up with patient regarding importance of medication adherence ? Confirms using weekly pillbox as adherence aid . Encourage patient to continue to monitor home BP, keep log and bring record with her to medical appointments . Inter-disciplinary care team collaboration (see longitudinal plan of care)  Patient Self Care Activities:  . Patient will provide necessary portions of application   Initial goal documentation and Please see past updates related to this goal by clicking on the "Past Updates" button in the selected goal         Plan  The care management team will reach out to the patient again over the next 30 days.   Harlow Asa, PharmD, [place] [employer] [phone]

## 2020-04-09 ENCOUNTER — Other Ambulatory Visit: Payer: Self-pay | Admitting: Pharmacy Technician

## 2020-04-09 NOTE — Patient Outreach (Signed)
[facility]) Care Management  04/09/2020  Kristin [name] August 07, 1930 [id number]   Received both patient and provider portion(s) of patient assistance application(s) for Eliquis. Faxed completed application and required documents into [employer].  Will follow up with company(ies) in 5-7 business days to check status of application(s).  [name] P. Argusta Mcgann, [place]  [phone]

## 2020-04-10 ENCOUNTER — Other Ambulatory Visit: Payer: Self-pay | Admitting: Pharmacy Technician

## 2020-04-10 NOTE — Patient Outreach (Signed)
[facility]) Care Management  04/10/2020  Kristin [name] 1930/07/07 [id number]  Care coordination call placed to [employer] in regards to patient's application for Eliquis.  Spoke to [place] who informed the application was still processing. He informed to check back in a few days but that hopefully it would be completed by end of day today.  Will follow up with [employer] in 2-5 business days to inquire about a determination.  Janis Cuffe P. Dillon Mcreynolds, [place]  [phone]

## 2020-04-11 ENCOUNTER — Other Ambulatory Visit: Payer: Self-pay | Admitting: Pharmacy Technician

## 2020-04-11 NOTE — Patient Outreach (Signed)
[facility]) Care Management  04/11/2020  Kristin [name] 01-29-1930 [id number]   ADDENDUM  Successful outreach call placed to patient in regards to patient's application with [employer].  Spoke with patient's son [name], HIPAA identifiers verified and confirmed.  Informed [name] that patient was approved with the [employer] foundation for patient assistance to receive her Eliquis at no charge. Inofrmed him that patient would need to call to set up her delivery with their pharmacy. Provided [name] the phone number to [employer]. [name] verbalized understanding.  Will follow up with patient in 7-10 business days to inquire if medication was received and to discuss refill procedure.  [name], [place]  [phone]

## 2020-04-11 NOTE — Patient Outreach (Signed)
[facility] [facility]) Care Management  04/11/2020  TENNELLE TAFLINGER 1930-04-13 [id number]  Care coordination call placed to [employer] in regards to Eliquis application.   Spoke to [place] who informed patient was APPROVED 04/10/2020-08/22/2020. She informed patient should be receiving a call from [facility], however, patient could outreach [employer] to set up her delivery as well.  Will outreach patient with this information.  Oluwatosin Higginson P. Rendy Lazard, [place]  [phone]

## 2020-04-22 ENCOUNTER — Other Ambulatory Visit: Payer: Self-pay | Admitting: Pharmacy Technician

## 2020-04-22 NOTE — Patient Outreach (Signed)
[facility]) Care Management  04/22/2020  WILLADEEN COLANTUONO [birth year] [id number]   Successful call placed to patient regarding patient assistance medication delivery of Eliquis from [employer], HIPAA identifiers verified.   Patient informs she has received the Eliquis for a 90 days supply. Informed patient this medication is auto shipped from [place] which is the pharmacy associated with [employer]. Informed her she should receive another shipment before end of year, but to outreach the company or myself if she does not receive a shipment and only has 2 weeks supply remaining. Patient verbalized understanding.  Confirmed patient had name and number as she had no other questions or concerns.  Follow up:  Will route note to embedded [place] for case closure  Jivan Symanski P. Oluwadara Gorman, [place]  [phone]

## 2020-04-29 ENCOUNTER — Other Ambulatory Visit: Payer: Self-pay

## 2020-04-29 ENCOUNTER — Encounter: Payer: Self-pay | Admitting: Family Medicine

## 2020-04-29 ENCOUNTER — Ambulatory Visit (INDEPENDENT_AMBULATORY_CARE_PROVIDER_SITE_OTHER): Payer: Medicare HMO | Admitting: Family Medicine

## 2020-04-29 VITALS — BP 113/74 | HR 63 | Temp 96.9°F | Resp 16 | Ht 66.0 in | Wt 159.0 lb

## 2020-04-29 DIAGNOSIS — R1031 Right lower quadrant pain: Secondary | ICD-10-CM

## 2020-04-29 DIAGNOSIS — G589 Mononeuropathy, unspecified: Secondary | ICD-10-CM

## 2020-04-29 DIAGNOSIS — I83223 Varicose veins of left lower extremity with both ulcer of ankle and inflammation: Secondary | ICD-10-CM

## 2020-04-29 DIAGNOSIS — L97321 Non-pressure chronic ulcer of left ankle limited to breakdown of skin: Secondary | ICD-10-CM

## 2020-04-29 MED ORDER — GABAPENTIN 100 MG PO CAPS: ORAL_CAPSULE | ORAL | 1 refills | Status: DC

## 2020-04-29 NOTE — Patient Instructions (Addendum)
Thank you for coming to the office today.  For Right groin pain, likely pinched nerve  Start Gabapentin 100mg  capsules, take at night for 2-3 nights only, and then increase to 2 times a day for a few days, and then may increase to 3 times a day, it may make you drowsy, if helps significantly at night only, then you can increase instead to 3 capsules at night, instead of 3 times a day - In the future if needed, we can significantly increase the dose if tolerated well, some common doses are 300mg  three times a day up to 600mg  three times a day, usually it takes several weeks or months to get to higher doses  Please notify Dr Dossie Arbour that you are also taking this medication.  Try to limit activities that are provoking it.  I do not see or feel a hernia at this time, if it is worsening and develops swelling seek care sooner.  For the sore on left ankle likely from [facility] vein ulcer   [facility] [place], [place] [address]  Main: [phone]   Please schedule a Follow-up Appointment to: Return in about 4 weeks (around 05/27/2020), or if symptoms worsen or fail to improve, for right groin.  If you have any other questions or concerns, please feel free to call the office or send a message through [place]. You may also schedule an earlier appointment if necessary.  Additionally, you may be receiving a survey about your experience at our office within a few days to 1 week by e-mail or mail. We value your feedback.  [name], DO [place]

## 2020-04-29 NOTE — Progress Notes (Signed)
Subjective:    [name], female    DOB: 03-Dec-1929, 86 y.o.   MRN: [id number]  Kristin [name] is a 86 y.o. female presenting on 04/29/2020 for Groin Pain (Right side pain and numbness--onset 2 weeks)   HPI   Right Groin Pain, episodic, numbness / pain Reports symptoms onset 2-3 weeks ago, gradual onset without improvement, seems to have numbness associated one localized area, she says worse if very active with housework in past few weeks. If she is sitting and resting it does not bother her as much but only when active. Additionally, followed by Dr Dossie Arbour, she takes Oxycodone but it does not help this problem Denies any bulging fullness, diarrhea abdominal pain, skin rash in this area  Varicose vein, ulceration Left side lower ankle Last seen 1 month ago, has had chronic issue for weeks or months with left sided lateral ankle with raised skin sore non healing some bleeding, scabbing, says it is hard firm nodule. She has varicose veins, that do cause pain at times. Denies worsening edema  Depression screen [facility] 2/9 04/01/2020 03/17/2020 01/31/2020  Decreased Interest 0 0 0  Down, Depressed, Hopeless 0 0 0  PHQ - 2 Score 0 0 0  Altered sleeping - - -  Tired, decreased energy - - -  Change in appetite - - -  Feeling bad or failure about yourself  - - -  Trouble concentrating - - -  Moving slowly or fidgety/restless - - -  Suicidal thoughts - - -  PHQ-9 Score - - -  Difficult doing work/chores - - -  Some recent data might be hidden    Social History   Tobacco Use  . Smoking status: Former Smoker    Packs/day: 1.25    Years: 40.00    Pack years: 50.00    Types: Cigarettes    Quit date: 12/16/1998    Years since quitting: 21.3  . Smokeless tobacco: Former Network [employer]  . Vaping Use: Never used  Substance Use Topics  . Alcohol use: No    Alcohol/week: 0.0 standard drinks  . Drug use: No    Review of Systems Per HPI unless specifically indicated  above     Objective:    BP 113/74   Pulse 63   Temp (!) 96.9 F (36.1 C) (Temporal)   Resp 16   Ht 5\' 6"  (1.676 m)   Wt 159 lb (72.1 kg)   SpO2 97%   BMI 25.66 kg/m   Wt Readings from Last 3 Encounters:  04/29/20 159 lb (72.1 kg)  04/01/20 160 lb 9.6 oz (72.8 kg)  03/17/20 156 lb (70.8 kg)    Physical Exam Vitals and nursing note reviewed.  Constitutional:      General: She is not in acute distress.    Appearance: She is well-developed. She is not diaphoretic.     Comments: Well-appearing, comfortable, cooperative  HENT:     Head: Normocephalic and atraumatic.  Eyes:     General:        Right eye: No discharge.        Left eye: No discharge.     Conjunctiva/sclera: Conjunctivae normal.     Pupils: Pupils are equal, round, and reactive to light.  Neck:     Thyroid: No thyromegaly.  Cardiovascular:     Rate and Rhythm: Normal rate and regular rhythm.     Heart sounds: Normal heart sounds. No murmur heard.   Pulmonary:  Effort: Pulmonary effort is normal. No respiratory distress.     Breath sounds: Normal breath sounds. No wheezing or rales.  Abdominal:     General: Bowel sounds are normal. There is no distension.     Palpations: Abdomen is soft. There is no mass.     Tenderness: There is no abdominal tenderness.  Musculoskeletal:        General: No tenderness. Normal range of motion.     Cervical back: Normal range of motion and neck supple.     Right lower leg: Edema (localized to lower ankles/feet, varicose veins) present.     Left lower leg: Edema (localized to lower ankles/feet, varicose veins, left lateral ankle over malleoluls area has slight callus formation some mild ulceration superficial only over varicose vein) present.     Comments: Upper / Lower Extremities: - Normal muscle tone, strength bilateral upper extremities 5/5, lower extremities 5/5  Uses cane. Able to stand from seated.  Lymphadenopathy:     Cervical: No cervical adenopathy.  Skin:     General: Skin is warm and dry.     Findings: No erythema or rash.  Neurological:     Mental Status: She is alert and oriented to person, place, and time.     Comments: Distal sensation intact to light touch all extremities  Psychiatric:        Behavior: Behavior normal.     Comments: Well groomed, good eye contact, normal speech and thoughts    Results for orders placed or performed in visit on 04/01/20  Hemoglobin A1c  Result Value Ref Range   Hgb A1c MFr Bld 5.5 <5.7 % of total Hgb   Mean Plasma Glucose 111 (calc)   eAG (mmol/L) 6.2 (calc)  CBC with Differential/Platelet  Result Value Ref Range   WBC 5.6 3.8 - 10.8 Thousand/uL   RBC 4.17 3.80 - 5.10 Million/uL   Hemoglobin 12.6 11.7 - 15.5 g/dL   HCT 40.4 35 - 45 %   MCV 96.9 80.0 - 100.0 fL   MCH 30.2 27.0 - 33.0 pg   MCHC 31.2 (L) 32.0 - 36.0 g/dL   RDW 12.5 11.0 - 15.0 %   Platelets 146 140 - 400 Thousand/uL   MPV 10.2 7.5 - 12.5 fL   Neutro Abs 2,386 1,500 - 7,800 cells/uL   Lymphs Abs 2,318 850 - 3,900 cells/uL   Absolute Monocytes 538 200 - 950 cells/uL   Eosinophils Absolute 319 15 - 500 cells/uL   Basophils Absolute 39 0 - 200 cells/uL   Neutrophils Relative % 42.6 %   Total Lymphocyte 41.4 %   Monocytes Relative 9.6 %   Eosinophils Relative 5.7 %   Basophils Relative 0.7 %  COMPLETE METABOLIC PANEL WITH GFR  Result Value Ref Range   Glucose, Bld 75 65 - 99 mg/dL   BUN 37 (H) 7 - 25 mg/dL   Creat 1.50 (H) 0.60 - 0.88 mg/dL   GFR, Est Non African American 31 (L) > OR = 60 mL/min/1.36m2   GFR, Est African American 36 (L) > OR = 60 mL/min/1.47m2   BUN/Creatinine Ratio 25 (H) 6 - 22 (calc)   Sodium 140 135 - 146 mmol/L   Potassium 4.7 3.5 - 5.3 mmol/L   Chloride 106 98 - 110 mmol/L   CO2 24 20 - 32 mmol/L   Calcium 9.8 8.6 - 10.4 mg/dL   Total Protein 6.9 6.1 - 8.1 g/dL   Albumin 4.2 3.6 - 5.1 g/dL   Globulin 2.7  1.9 - 3.7 g/dL (calc)   AG Ratio 1.6 1.0 - 2.5 (calc)   Total Bilirubin 0.6 0.2 - 1.2 mg/dL    Alkaline phosphatase (APISO) 63 37 - 153 U/L   AST 21 10 - 35 U/L   ALT 12 6 - 29 U/L  Lipid panel  Result Value Ref Range   Cholesterol 199 <200 mg/dL   HDL 62 > OR = 50 mg/dL   Triglycerides 92 <150 mg/dL   LDL Cholesterol (Calc) 117 (H) mg/dL (calc)   Total CHOL/HDL Ratio 3.2 <5.0 (calc)   Non-HDL Cholesterol (Calc) 137 (H) <130 mg/dL (calc)  VITAMIN D 25 Hydroxy (Vit-D Deficiency, Fractures)  Result Value Ref Range   Vit D, 25-Hydroxy 31 30 - 100 ng/mL  TSH  Result Value Ref Range   TSH 1.65 0.40 - 4.50 mIU/L      Assessment & Plan:   Problem List Items Addressed This Visit    Varicose veins of left lower extremity with inflammation, with ulcer of ankle limited to breakdown of skin ([place])   Relevant Orders   Ambulatory referral to Vascular Surgery    Other Visit Diagnoses    Right groin pain    -  Primary   Relevant Medications   gabapentin (NEURONTIN) 100 MG capsule   Pinched nerve          #Right groin pain Most likely secondary to pinched nerve in this area with neuropathic episodic pain only with over-exertion and repetitive activities otherwise not having symptoms when at rest. She has known extensive lumbar DJD / hip DJD and other MSK problems, has some reduced mobility, uses cane, and seems to have activity triggered localized pain across this area, on exam seems to be over her muscle / fascia in R groin, not involving acetabular joint or other areas. No rash on skin. - Trial on gabapentin 100mg  nightly vs titrate up to TID dosing if prefer, may help reduce neuropathic pain in this area - Caution sedation on this med, she may notify Dr Dossie Arbour regarding this med - Limit provoking activities  #Varicose Vein, superficial ulceration Left ankle, not improved on topical regimen including mupirocin No secondary cellulitis infection identified Edema is improved or stable, still has ulceration and some pain Referral to [facility] for further consultation on  symptomatic varicose veins  Orders Placed This Encounter  Procedures  . Ambulatory referral to Vascular Surgery    Referral Priority:   Routine    Referral Type:   Surgical    Referral Reason:   Specialty Services Required    Requested Specialty:   Vascular Surgery    Number of Visits Requested:   1     Meds ordered this encounter  Medications  . gabapentin (NEURONTIN) 100 MG capsule    Sig: Start 1 capsule daily, increase by 1 cap every 2-3 days as tolerated up to 3 times a day, or may take 3 at once in evening.    Dispense:  90 capsule    Refill:  1      Follow up plan: Return in about 4 weeks (around 05/27/2020), or if symptoms worsen or fail to improve, for right groin.    [name], DO [facility] 04/29/2020, 2:01 PM

## 2020-05-05 ENCOUNTER — Ambulatory Visit (INDEPENDENT_AMBULATORY_CARE_PROVIDER_SITE_OTHER): Payer: Medicare HMO | Admitting: Pharmacist

## 2020-05-05 DIAGNOSIS — I4892 Unspecified atrial flutter: Secondary | ICD-10-CM | POA: Diagnosis not present

## 2020-05-05 DIAGNOSIS — I129 Hypertensive chronic kidney disease with stage 1 through stage 4 chronic kidney disease, or unspecified chronic kidney disease: Secondary | ICD-10-CM | POA: Diagnosis not present

## 2020-05-05 DIAGNOSIS — I4891 Unspecified atrial fibrillation: Secondary | ICD-10-CM | POA: Diagnosis not present

## 2020-05-05 DIAGNOSIS — N183 Chronic kidney disease, stage 3 unspecified: Secondary | ICD-10-CM

## 2020-05-05 NOTE — Patient Instructions (Signed)
Thank you allowing the Chronic Care Management Team to be a part of your care! It was a pleasure speaking with you today!     CCM (Chronic Care Management) Team    Noreene Larsson RN, MSN, CCM Nurse Care Coordinator  [phone]   Harlow Asa PharmD  Clinical Pharmacist  [phone]   Eula Fried LCSW Clinical Social Worker [phone]  Visit Information  Goals Addressed            This Visit's Progress    COMPLETED: PharmD - Medication Assistance       CARE PLAN ENTRY (see longitudinal plan of care for additional care plan information)  Current Barriers:   Financial Barriers in complicated patient with multiple medical conditions including hypertension, CKD, Chronic heart failure with preserved ejection fraction, COPD, atrial fibrillation, seasonal allergies, chronic pain, restless leg syndrome; patient has [name] and reports copay for Eliquis  is cost prohibitive at this time o Note [facility] Pharmacy assisted patient with applying for and receiving medication assistance from Eliquis in 2020 calendar year  Pharmacist Clinical Goal(s):   Over the next 30 days, patient will work with PharmD and providers to relieve medication access concerns  Interventions:  Received coordination of care message from [facility]Ph[facility] [employer].  o Patient approved for Eliquis patient assistance program 04/10/2020-08/22/2020, has received medication and [facility] CPhT has reviewed refill process with patient.  Perform chart review. Note patient seen by PCP on 9/7 for groin pain o Patient started on trial of gabapentin 100 mg - 1 capsule at night for 2-3 nights only, and then increase to 2 times a day for a few days, and then may increase to 3 times a day - Provider cautioned patient regarding sedation risk with medication and about also notifying pain management provider  Follow up with patient regarding medication assistance. Patient denies further medication  assistance needs at this time o Requests follow up with CCM Pharmacist in mid-2022 calendar year to assess needs/eligibility to reapply for assistance   Follow up with patient regarding gabapentin Rx. Reports taking as directed by PCP. Denies any dizziness or sedation   Follow up with patient regarding importance of medication adherence ? Confirms using weekly pillbox as adherence aid  Encourage patient to continue to monitor home BP, keep log and bring record with her to medical appointments ? Reports taking: ? Furosemide 20 mg - 1/2 tablet daily ? Lisinopril 10 mg - 1 tablet daily ? Reports home BP ranging: 110s-130s/70s  Inter-disciplinary care team collaboration (see longitudinal plan of care)  Patient Self Care Activities:   Patient will provide necessary portions of application   Please see past updates related to this goal by clicking on the "Past Updates" button in the selected goal         Patient verbalizes understanding of instructions provided today.   The care management team will reach out to the patient again in June 2022 The patient has been provided with contact information for CM Pharmacist and has been advised to call with any medication related questions or concerns   Harlow Asa, PharmD, [place] [phone]

## 2020-05-05 NOTE — Chronic Care Management (AMB) (Signed)
Chronic Care Management   Follow Up Note   05/05/2020 Name: Kristin [name] MRN: [id number] DOB: February 03, 1930  Referred by: Olin Hauser, DO Reason for referral : Chronic Care Management (Patient Phone Call)   Kristin [name] is a 86 y.o. year old female who is a primary care patient of Olin Hauser, DO. The CCM team was consulted for assistance with chronic disease management and care coordination needs.    I reached out to [name] by phone today.   Review of patient status, including review of consultants reports, relevant laboratory and other test results, and collaboration with appropriate care team members and the patient's provider was performed as part of comprehensive patient evaluation and provision of chronic care management services.    SDOH (Social Determinants of Health) assessments performed: No See Care Plan activities for detailed interventions related to [facility])     Outpatient Encounter Medications as of 05/05/2020  Medication Sig Note  . furosemide (LASIX) 20 MG tablet TAKE (1/2) TABLET BY MOUTH ONCE DAILY.   Marland Kitchen gabapentin (NEURONTIN) 100 MG capsule Start 1 capsule daily, increase by 1 cap every 2-3 days as tolerated up to 3 times a day, or may take 3 at once in evening.   Marland Kitchen lisinopril (ZESTRIL) 10 MG tablet Take 1 tablet (10 mg total) by mouth daily.   Marland Kitchen acetaminophen (TYLENOL) 500 MG tablet Take 500 mg by mouth 3 (three) times daily.   Marland Kitchen albuterol (PROVENTIL) (2.5 MG/3ML) 0.083% nebulizer solution Take 3 mLs (2.5 mg total) by nebulization every 8 (eight) hours as needed for wheezing or shortness of breath.   Marland Kitchen apixaban (ELIQUIS) 5 MG TABS tablet Take 1 tablet (5 mg total) by mouth 2 (two) times daily.   . bisacodyl (DULCOLAX) 5 MG EC tablet Take 2 tablets (10 mg total) by mouth daily as needed for moderate constipation.   . Calcium Carbonate-Vitamin D 600-200 MG-UNIT TABS Take by mouth.   . Cholecalciferol (VITAMIN D) 2000 units tablet Take 2,000  Units by mouth daily.    Marland Kitchen levocetirizine (XYZAL) 5 MG tablet Take 0.5 tablets (2.5 mg total) by mouth every other day.   Derrill Memo ON 05/28/2020] Melatonin 10 MG CAPS Take 10 mg by mouth at bedtime.   . mupirocin ointment (BACTROBAN) 2 % Apply 1 application topically 2 (two) times daily as needed. For up to 7-10 days as needed to help heal and prevent infection.   . Oxycodone HCl 20 MG TABS Take 1 tablet (20 mg total) by mouth every 8 (eight) hours as needed. Must last 30 days (Patient not taking: Reported on 04/01/2020) 03/17/2020: FUTURE Prescription. (NOT a DUPLICATE!!) >>>DO NOT DELETE<<< (even if Expired!) See Care Coordination Note from [facility] (Dr. Dossie Arbour)    . Oxycodone HCl 20 MG TABS Take 1 tablet (20 mg total) by mouth every 8 (eight) hours as needed. Must last 30 days 03/17/2020: FUTURE Prescription. (NOT a DUPLICATE!!) >>>DO NOT DELETE<<< (even if Expired!) See Care Coordination Note from [facility] Pain Management (Dr. Dossie Arbour)  . [START ON 05/27/2020] Oxycodone HCl 20 MG TABS Take 1 tablet (20 mg total) by mouth every 8 (eight) hours as needed. Must last 30 days 03/17/2020: FUTURE Prescription. (NOT a DUPLICATE!!) >>>DO NOT DELETE<<< (even if Expired!) See Care Coordination Note from [facility] (Dr. Dossie Arbour)  . OXYGEN Inhale 2 L into the lungs at bedtime.  04/25/2018: At night as needed  . polyethylene glycol (MIRALAX / GLYCOLAX) 17 g packet Take 17  g by mouth daily as needed for moderate constipation. Must last 30 days 12/19/2019: PRN  . rOPINIRole (REQUIP) 2 MG tablet Take 2 tablets (4 mg total) by mouth at bedtime. Must last 30 days   . Wheat Dextrin (BENEFIBER) POWD Take 6 g by mouth 3 (three) times daily before meals. (2 tsp = 6 g) 12/19/2019: PRN   No facility-administered encounter medications on file as of 05/05/2020.    Goals Addressed            This Visit's Progress   . COMPLETED: PharmD - Medication Assistance       CARE PLAN ENTRY (see longitudinal plan of  care for additional care plan information)  Current Barriers:  . Financial Barriers in complicated patient with multiple medical conditions including hypertension, CKD, Chronic heart failure with preserved ejection fraction, COPD, atrial fibrillation, seasonal allergies, chronic pain, restless leg syndrome; patient has [name] and reports copay for Eliquis  is cost prohibitive at this time o Note [facility] assisted patient with applying for and receiving medication assistance from Eliquis in 2020 calendar year  Pharmacist Clinical Goal(s):  Marland Kitchen Over the next 30 days, patient will work with PharmD and providers to relieve medication access concerns  Interventions: . Received coordination of care message from [name].  o Patient approved for Eliquis patient assistance program 04/10/2020-08/22/2020, has received medication and [facility] CPhT has reviewed refill process with patient. . Perform chart review. Note patient seen by PCP on 9/7 for groin pain o Patient started on trial of gabapentin 100 mg - 1 capsule at night for 2-3 nights only, and then increase to 2 times a day for a few days, and then may increase to 3 times a day - Provider cautioned patient regarding sedation risk with medication and about also notifying pain management provider . Follow up with patient regarding medication assistance. Patient denies further medication assistance needs at this time o Requests follow up with CCM Pharmacist in mid-2022 calendar year to assess needs/eligibility to reapply for assistance  . Follow up with patient regarding gabapentin Rx. Reports taking as directed by PCP. Denies any dizziness or sedation  . Follow up with patient regarding importance of medication adherence ? Confirms using weekly pillbox as adherence aid . Encourage patient to continue to monitor home BP, keep log and bring record with her to medical appointments ? Reports taking: ? Furosemide 20  mg - 1/2 tablet daily ? Lisinopril 10 mg - 1 tablet daily ? Reports home BP ranging: 110s-130s/70s . Inter-disciplinary care team collaboration (see longitudinal plan of care)  Patient Self Care Activities:  . Patient will provide necessary portions of application   Please see past updates related to this goal by clicking on the "Past Updates" button in the selected goal         Plan  The care management team will reach out to the patient again in June 2022 The patient has been provided with contact information for CM Pharmacist and has been advised to call with any medication related questions or concerns.   Harlow Asa, PharmD, [place] [employer] [phone]

## 2020-05-13 ENCOUNTER — Ambulatory Visit (INDEPENDENT_AMBULATORY_CARE_PROVIDER_SITE_OTHER): Payer: Medicare HMO

## 2020-05-13 VITALS — Ht 69.0 in | Wt 160.0 lb

## 2020-05-13 DIAGNOSIS — Z0000 Encounter for general adult medical examination without abnormal findings: Secondary | ICD-10-CM

## 2020-05-13 NOTE — Progress Notes (Addendum)
I connected with Tobe Sos today by telephone and verified that I am speaking with the correct person using two identifiers. Location patient: home Location provider: work Persons participating in the virtual visit: [place], [place] LPN.   I discussed the limitations, risks, security and privacy concerns of performing an evaluation and management service by telephone and the availability of in person appointments. I also discussed with the patient that there may be a patient responsible charge related to this service. The patient expressed understanding and verbally consented to this telephonic visit.    Interactive audio and video telecommunications were attempted between this provider and patient, however failed, due to patient having technical difficulties OR patient did not have access to video capability.  We continued and completed visit with audio only.     Vital signs may be patient reported or missing.  Subjective:   Kristin [name] is a 86 y.o. female who presents for Medicare Annual (Subsequent) preventive examination.  Review of Systems     Cardiac Risk Factors include: advanced age (>48men, >20 women);dyslipidemia;hypertension;sedentary lifestyle     Objective:    Today's Vitals   05/13/20 0951 05/13/20 0952  Weight: 160 lb (72.6 kg)   Height: 5\' 9"  (1.753 m)   PainSc:  6    Body mass index is 23.63 kg/m.  Advanced Directives 05/13/2020 03/17/2020 01/31/2020 01/10/2020 09/27/2019 06/12/2019 05/08/2019  Does Patient Have a Medical Advance Directive? Yes Yes No No No No No  Type of Paramedic of [place];Living will [place];Living will - - - - -  Copy of [place] in Chart? No - copy requested - - - - - -  Would patient like information on creating a medical advance directive? - - No - Patient declined - No - Patient declined - Yes (MAU/Ambulatory/Procedural Areas - Information given)  Pre-existing  out of facility DNR order (yellow form or pink MOST form) - - - - - - -    Current Medications (verified) Outpatient Encounter Medications as of 05/13/2020  Medication Sig  . acetaminophen (TYLENOL) 500 MG tablet Take 500 mg by mouth 3 (three) times daily.  Marland Kitchen albuterol (PROVENTIL) (2.5 MG/3ML) 0.083% nebulizer solution Take 3 mLs (2.5 mg total) by nebulization every 8 (eight) hours as needed for wheezing or shortness of breath.  Marland Kitchen apixaban (ELIQUIS) 5 MG TABS tablet Take 1 tablet (5 mg total) by mouth 2 (two) times daily.  . bisacodyl (DULCOLAX) 5 MG EC tablet Take 2 tablets (10 mg total) by mouth daily as needed for moderate constipation.  . Calcium Carbonate-Vitamin D 600-200 MG-UNIT TABS Take by mouth.  . Cholecalciferol (VITAMIN D) 2000 units tablet Take 2,000 Units by mouth daily.   . furosemide (LASIX) 20 MG tablet TAKE (1/2) TABLET BY MOUTH ONCE DAILY.  Marland Kitchen gabapentin (NEURONTIN) 100 MG capsule Start 1 capsule daily, increase by 1 cap every 2-3 days as tolerated up to 3 times a day, or may take 3 at once in evening.  Marland Kitchen levocetirizine (XYZAL) 5 MG tablet Take 0.5 tablets (2.5 mg total) by mouth every other day.  . lisinopril (ZESTRIL) 10 MG tablet Take 1 tablet (10 mg total) by mouth daily.  Derrill Memo ON 05/28/2020] Melatonin 10 MG CAPS Take 10 mg by mouth at bedtime.  . Oxycodone HCl 20 MG TABS Take 1 tablet (20 mg total) by mouth every 8 (eight) hours as needed. Must last 30 days  . [START ON 05/27/2020] Oxycodone HCl  20 MG TABS Take 1 tablet (20 mg total) by mouth every 8 (eight) hours as needed. Must last 30 days  . OXYGEN Inhale 2 L into the lungs at bedtime.   . polyethylene glycol (MIRALAX / GLYCOLAX) 17 g packet Take 17 g by mouth daily as needed for moderate constipation. Must last 30 days  . rOPINIRole (REQUIP) 2 MG tablet Take 2 tablets (4 mg total) by mouth at bedtime. Must last 30 days  . Wheat Dextrin (BENEFIBER) POWD Take 6 g by mouth 3 (three) times daily before meals. (2 tsp =  6 g)  . mupirocin ointment (BACTROBAN) 2 % Apply 1 application topically 2 (two) times daily as needed. For up to 7-10 days as needed to help heal and prevent infection. (Patient not taking: Reported on 05/13/2020)  . Oxycodone HCl 20 MG TABS Take 1 tablet (20 mg total) by mouth every 8 (eight) hours as needed. Must last 30 days (Patient not taking: Reported on 04/01/2020)   No facility-administered encounter medications on file as of 05/13/2020.    Allergies (verified) Flexeril [cyclobenzaprine hcl] and Vancomycin   History: Past Medical History:  Diagnosis Date  . Acute postoperative pain 02/09/2018  . Arrhythmia, sinus node 04/03/2014  . Arthritis   . Arthritis   . Atrial fibrillation ([place])   . Back pain    lower back chronic  . Bradycardia   . Breast cancer ([place]) 2004   right breast cancer  . Cancer [facility]) 2004   rt breast cancer-post lumpectomy- chemo/rad  . CHF (congestive heart failure) ([place])   . Chronic back pain   . CKD (chronic kidney disease)   . COPD (chronic obstructive pulmonary disease) ([facility])    wears O2 at 2L via [place] at night  . Cystocele   . Decubitus ulcers   . Dehydration   . Gout   . Hematuria   . Hepatitis C   . Hyperlipidemia   . Hypertension   . Hypomagnesemia 06/25/2015  . OAB (overactive bladder)   . Overactive bladder   . Restless leg   . Shoulder pain, bilateral   . Urinary frequency   . UTI (lower urinary tract infection)   . Vaginal atrophy    Past Surgical History:  Procedure Laterality Date  . ABDOMINAL HYSTERECTOMY    . BACK SURGERY    . BREAST LUMPECTOMY Right 2004  . BREAST SURGERY     rt lumpectomy  . CARPAL TUNNEL RELEASE    . CATARACT EXTRACTION W/PHACO  10/19/2011   Procedure: CATARACT EXTRACTION PHACO AND INTRAOCULAR LENS PLACEMENT (IOC);  Surgeon: Elta Guadeloupe T. Gershon Crane, MD;  Location: [facility];  Service: Ophthalmology;  Laterality: Right;  CDE:10.81  . CATARACT EXTRACTION W/PHACO  11/02/2011   Procedure: CATARACT EXTRACTION PHACO AND  INTRAOCULAR LENS PLACEMENT (IOC);  Surgeon: Elta Guadeloupe T. Gershon Crane, MD;  Location: [facility];  Service: Ophthalmology;  Laterality: Left;  CDE 8.60  . CHOLECYSTECTOMY     3/18  . COLON SURGERY    . INSERT / REPLACE / REMOVE PACEMAKER    . JOINT REPLACEMENT     bilateral TKA  . PACEMAKER INSERTION N/A 12/24/2015   Procedure: INSERTION PACEMAKER;  Surgeon: Isaias Cowman, MD;  Location: [facility] ORS;  Service: Cardiovascular;  Laterality: N/A;  . ROTATOR CUFF REPAIR     bilateral   Family History  Problem Relation Age of Onset  . Kidney disease Brother   . Heart disease Father   . Heart disease Mother   . Stroke Mother   .  Cancer Sister   . Breast cancer Sister 12  . Breast cancer Paternal Aunt   . Anesthesia problems Neg Hx   . Hypotension Neg Hx   . Malignant hyperthermia Neg Hx   . Pseudochol deficiency Neg Hx    Social History   Socioeconomic History  . Marital status: Single    Spouse name: Not on file  . Number of children: 2  . Years of education: 60  . Highest education level: 12th grade  Occupational History  . Occupation: retired  Tobacco Use  . Smoking status: Former Smoker    Packs/day: 1.25    Years: 40.00    Pack years: 50.00    Types: Cigarettes    Quit date: 12/16/1998    Years since quitting: 21.4  . Smokeless tobacco: Former Network engineer  . Vaping Use: Never used  Substance and Sexual Activity  . Alcohol use: No    Alcohol/week: 0.0 standard drinks  . Drug use: Yes    Types: Oxycodone  . Sexual activity: Not Currently    Birth control/protection: Surgical  Other Topics Concern  . Not on file  Social History Narrative  . Not on file   Social Determinants of Health   Financial Resource Strain: Low Risk   . Difficulty of Paying Living Expenses: Not hard at all  Food Insecurity: No Food Insecurity  . Worried About Charity fundraiser in the Last Year: Never true  . Ran Out of Food in the Last Year: Never true  Transportation Needs: No  Transportation Needs  . Lack of Transportation (Medical): No  . Lack of Transportation (Non-Medical): No  Physical Activity: Inactive  . Days of Exercise per Week: 0 days  . Minutes of Exercise per Session: 0 min  Stress: No Stress Concern Present  . Feeling of Stress : Not at all  Social Connections:   . Frequency of Communication with Friends and Family: Not on file  . Frequency of Social Gatherings with Friends and Family: Not on file  . Attends Religious Services: Not on file  . Active Member of Clubs or Organizations: Not on file  . Attends Archivist Meetings: Not on file  . Marital Status: Not on file    Tobacco Counseling Counseling given: Not Answered   Clinical Intake:  Pre-visit preparation completed: Yes  Pain : 0-10 Pain Score: 6  Pain Type: Chronic pain Pain Location: Back Pain Orientation: Lower Pain Descriptors / Indicators: Aching Pain Onset: More than a month ago Pain Frequency: Intermittent Pain Relieving Factors: rest helps  Pain Relieving Factors: rest helps  Nutritional Status: BMI of 19-24  Normal Nutritional Risks: None Diabetes: No  How often do you need to have someone help you when you read instructions, pamphlets, or other written materials from your doctor or pharmacy?: 1 - Never What is the last grade level you completed in school?: college  Diabetic? no  Interpreter Needed?: No  Information entered by :: NAllen LPN   Activities of Daily Living In your present state of health, do you have any difficulty performing the following activities: 05/13/2020  Hearing? Y  Comment a little  Vision? N  Difficulty concentrating or making decisions? N  Walking or climbing stairs? Y  Dressing or bathing? N  Doing errands, shopping? N  Preparing Food and eating ? N  Using the Toilet? N  In the past six months, have you accidently leaked urine? Y  Comment occasionally  Do you have problems  with loss of bowel control? N  Managing  your Medications? N  Managing your Finances? N  Housekeeping or managing your Housekeeping? N  Some recent data might be hidden    Patient Care Team: Olin Hauser, DO as PCP - General (Family Medicine) Parks Ranger Devonne Doughty, DO as Consulting Physician (Family Medicine) Isaias Cowman, MD as Consulting Physician (Cardiology) Milinda Pointer, MD as Referring Physician (Pain Medicine) Alisa Graff, FNP as Nurse Practitioner (Family Medicine) [place], Virl Diamond, [facility] as Pharmacist  Indicate any recent Medical Services you may have received from other than [facility] providers in the past year (date may be approximate).     Assessment:   This is a routine wellness examination for Vernell.  Hearing/Vision screen  Hearing Screening   125Hz  250Hz  500Hz  1000Hz  2000Hz  3000Hz  4000Hz  6000Hz  8000Hz   Right ear:           Left ear:           Vision Screening Comments: Regular eye exams, Dr. Gershon Crane  Dietary issues and exercise activities discussed: Current Exercise Habits: The patient does not participate in regular exercise at present  Goals    . DIET - INCREASE WATER INTAKE     Recommend drinking at least 6-8 glasses of water a day     . Patient Stated     05/13/2020, no goals      Depression Screen PHQ 2/9 Scores 05/13/2020 04/01/2020 03/17/2020 01/31/2020 10/02/2019 09/27/2019 05/08/2019  PHQ - 2 Score 0 0 0 0 0 0 0  PHQ- 9 Score - - - - - - 0  Exception Documentation - - - - - - -    Fall Risk Fall Risk  05/13/2020 04/01/2020 03/17/2020 01/31/2020 01/10/2020  Falls in the past year? 0 0 0 0 0  Number falls in past yr: - 0 - - -  Injury with Fall? - 0 - - -  Risk for fall due to : Medication side effect - - - -  Follow up Falls evaluation completed;Education provided;Falls prevention discussed Falls evaluation completed - - -    Any stairs in or around the home? Yes  If so, are there any without handrails? No  Home free of loose throw rugs in walkways, pet beds,  electrical cords, etc? Yes  Adequate lighting in your home to reduce risk of falls? Yes   ASSISTIVE DEVICES UTILIZED TO PREVENT FALLS:  Life alert? No  Use of a cane, walker or w/c? Yes  Grab bars in the bathroom? Yes  Shower chair or bench in shower? Yes  Elevated toilet seat or a handicapped toilet? Yes   TIMED UP AND GO:  Was the test performed? No .  Cognitive Function:     6CIT Screen 05/13/2020 05/08/2019 04/25/2018 04/19/2017  What Year? 0 points 0 points 0 points 0 points  What month? 0 points 0 points 0 points 0 points  What time? 0 points 0 points 0 points 0 points  Count back from 20 0 points 0 points 0 points 0 points  Months in reverse 2 points 0 points 0 points 0 points  Repeat phrase 0 points 0 points 2 points 0 points  Total Score 2 0 2 0    Immunizations Immunization History  Administered Date(s) Administered  . Fluad Quad(high Dose 65+) 05/08/2019  . Influenza, High Dose Seasonal PF 06/17/2015, 06/22/2016, 04/19/2017, 06/02/2018  . Moderna SARS-COVID-2 Vaccination 09/07/2019, 10/05/2019  . Pneumococcal Conjugate-13 07/30/2014  . Pneumococcal Polysaccharide-23 08/23/2008, 01/18/2018  . Tdap  07/30/2014    TDAP status: Up to date Flu Vaccine status: Up to date Pneumococcal vaccine status: Up to date Covid-19 vaccine status: Completed vaccines  Qualifies for Shingles Vaccine? Yes   Zostavax completed No   Shingrix Completed?: No.    Education has been provided regarding the importance of this vaccine. Patient has been advised to call insurance company to determine out of pocket expense if they have not yet received this vaccine. Advised may also receive vaccine at local pharmacy or Health Dept. Verbalized acceptance and understanding.  Screening Tests Health Maintenance  Topic Date Due  . INFLUENZA VACCINE  03/23/2020  . TETANUS/TDAP  07/30/2024  . DEXA SCAN  Completed  . COVID-19 Vaccine  Completed  . PNA vac Low Risk Adult  Completed    Health  Maintenance  Health Maintenance Due  Topic Date Due  . INFLUENZA VACCINE  03/23/2020    Colorectal cancer screening: No longer required.  Mammogram status: No longer required.  Bone Density status: Completed 06/17/2015.   Lung Cancer Screening: (Low Dose CT Chest recommended if Age 76-80 years, 30 pack-year currently smoking OR have quit w/in 15years.) does not qualify.   Lung Cancer Screening Referral: no  Additional Screening:  Hepatitis C Screening: does not qualify;  Vision Screening: Recommended annual ophthalmology exams for early detection of glaucoma and other disorders of the eye. Is the patient up to date with their annual eye exam?  Yes  Who is the provider or what is the name of the office in which the patient attends annual eye exams? Dr. Gershon Crane If pt is not established with a provider, would they like to be referred to a provider to establish care? No .   Dental Screening: Recommended annual dental exams for proper oral hygiene  Community Resource Referral / Chronic Care Management: CRR required this visit?  No   CCM required this visit?  No      Plan:     I have personally reviewed and noted the following in the patient's chart:   . Medical and social history . Use of alcohol, tobacco or illicit drugs  . Current medications and supplements . Functional ability and status . Nutritional status . Physical activity . Advanced directives . List of other physicians . Hospitalizations, surgeries, and ER visits in previous 12 months . Vitals . Screenings to include cognitive, depression, and falls . Referrals and appointments  In addition, I have reviewed and discussed with patient certain preventive protocols, quality metrics, and best practice recommendations. A written personalized care plan for preventive services as well as general preventive health recommendations were provided to patient.     Kellie Simmering, LPN   [id number]/00/[id number]   Nurse Notes:

## 2020-05-13 NOTE — Patient Instructions (Signed)
Kristin [name] , Thank you for taking time to come for your Medicare Wellness Visit. I appreciate your ongoing commitment to your health goals. Please review the following plan we discussed and let me know if I can assist you in the future.   Screening recommendations/referrals: Colonoscopy: not required Mammogram: not required Bone Density: completed 06/17/2015 Recommended yearly ophthalmology/optometry visit for glaucoma screening and checkup Recommended yearly dental visit for hygiene and checkup  Vaccinations: Influenza vaccine: due Pneumococcal vaccine: completed 01/18/2018 Tdap vaccine: completed 07/30/2014 Shingles vaccine: discussed   Covid-19: 09/07/2019, 10/05/2019  Advanced directives: Please bring a copy of your POA (Power of Attorney) and/or Living Will to your next appointment.   Conditions/risks identified: none  Next appointment: Follow up in one year for your annual wellness visit    Preventive Care 65 Years and Older, Female Preventive care refers to lifestyle choices and visits with your health care provider that can promote health and wellness. What does preventive care include?  A yearly physical exam. This is also called an annual well check.  Dental exams once or twice a year.  Routine eye exams. Ask your health care provider how often you should have your eyes checked.  Personal lifestyle choices, including:  Daily care of your teeth and gums.  Regular physical activity.  Eating a healthy diet.  Avoiding tobacco and drug use.  Limiting alcohol use.  Practicing safe sex.  Taking low-dose aspirin every day.  Taking vitamin and mineral supplements as recommended by your health care provider. What happens during an annual well check? The services and screenings done by your health care provider during your annual well check will depend on your age, overall health, lifestyle risk factors, and family history of disease. Counseling  Your health care  provider may ask you questions about your:  Alcohol use.  Tobacco use.  Drug use.  Emotional well-being.  Home and relationship well-being.  Sexual activity.  Eating habits.  History of falls.  Memory and ability to understand (cognition).  Work and work Statistician.  Reproductive health. Screening  You may have the following tests or measurements:  Height, weight, and BMI.  Blood pressure.  Lipid and cholesterol levels. These may be checked every 5 years, or more frequently if you are over 33 years old.  Skin check.  Lung cancer screening. You may have this screening every year starting at age 33 if you have a 30-pack-year history of smoking and currently smoke or have quit within the past 15 years.  Fecal occult blood test (FOBT) of the stool. You may have this test every year starting at age 75.  Flexible sigmoidoscopy or colonoscopy. You may have a sigmoidoscopy every 5 years or a colonoscopy every 10 years starting at age 34.  Hepatitis C blood test.  Hepatitis B blood test.  Sexually transmitted disease (STD) testing.  Diabetes screening. This is done by checking your blood sugar (glucose) after you have not eaten for a while (fasting). You may have this done every 1-3 years.  Bone density scan. This is done to screen for osteoporosis. You may have this done starting at age 1.  Mammogram. This may be done every 1-2 years. Talk to your health care provider about how often you should have regular mammograms. Talk with your health care provider about your test results, treatment options, and if necessary, the need for more tests. Vaccines  Your health care provider may recommend certain vaccines, such as:  Influenza vaccine. This is recommended every year.  Tetanus, diphtheria, and acellular pertussis (Tdap, Td) vaccine. You may need a Td booster every 10 years.  Zoster vaccine. You may need this after age 69.  Pneumococcal 13-valent conjugate (PCV13)  vaccine. One dose is recommended after age 46.  Pneumococcal polysaccharide (PPSV23) vaccine. One dose is recommended after age 18. Talk to your health care provider about which screenings and vaccines you need and how often you need them. This information is not intended to replace advice given to you by your health care provider. Make sure you discuss any questions you have with your health care provider. Document Released: 09/05/2015 Document Revised: 04/28/2016 Document Reviewed: 06/10/2015 Elsevier Interactive Patient Education  2017 [place] Prevention in the Home Falls can cause injuries. They can happen to people of all ages. There are many things you can do to make your home safe and to help prevent falls. What can I do on the outside of my home?  Regularly fix the edges of walkways and driveways and fix any cracks.  Remove anything that might make you trip as you walk through a door, such as a raised step or threshold.  Trim any bushes or trees on the path to your home.  Use bright outdoor lighting.  Clear any walking paths of anything that might make someone trip, such as rocks or tools.  Regularly check to see if handrails are loose or broken. Make sure that both sides of any steps have handrails.  Any raised decks and porches should have guardrails on the edges.  Have any leaves, snow, or ice cleared regularly.  Use sand or salt on walking paths during winter.  Clean up any spills in your garage right away. This includes oil or grease spills. What can I do in the bathroom?  Use night lights.  Install grab bars by the toilet and in the tub and shower. Do not use towel bars as grab bars.  Use non-skid mats or decals in the tub or shower.  If you need to sit down in the shower, use a plastic, non-slip stool.  Keep the floor dry. Clean up any water that spills on the floor as soon as it happens.  Remove soap buildup in the tub or shower  regularly.  Attach bath mats securely with double-sided non-slip rug tape.  Do not have throw rugs and other things on the floor that can make you trip. What can I do in the bedroom?  Use night lights.  Make sure that you have a light by your bed that is easy to reach.  Do not use any sheets or blankets that are too big for your bed. They should not hang down onto the floor.  Have a firm chair that has side arms. You can use this for support while you get dressed.  Do not have throw rugs and other things on the floor that can make you trip. What can I do in the kitchen?  Clean up any spills right away.  Avoid walking on wet floors.  Keep items that you use a lot in easy-to-reach places.  If you need to reach something above you, use a strong step stool that has a grab bar.  Keep electrical cords out of the way.  Do not use floor polish or wax that makes floors slippery. If you must use wax, use non-skid floor wax.  Do not have throw rugs and other things on the floor that can make you trip. What can I do  with my stairs?  Do not leave any items on the stairs.  Make sure that there are handrails on both sides of the stairs and use them. Fix handrails that are broken or loose. Make sure that handrails are as long as the stairways.  Check any carpeting to make sure that it is firmly attached to the stairs. Fix any carpet that is loose or worn.  Avoid having throw rugs at the top or bottom of the stairs. If you do have throw rugs, attach them to the floor with carpet tape.  Make sure that you have a light switch at the top of the stairs and the bottom of the stairs. If you do not have them, ask someone to add them for you. What else can I do to help prevent falls?  Wear shoes that:  Do not have high heels.  Have rubber bottoms.  Are comfortable and fit you well.  Are closed at the toe. Do not wear sandals.  If you use a stepladder:  Make sure that it is fully  opened. Do not climb a closed stepladder.  Make sure that both sides of the stepladder are locked into place.  Ask someone to hold it for you, if possible.  Clearly mark and make sure that you can see:  Any grab bars or handrails.  First and last steps.  Where the edge of each step is.  Use tools that help you move around (mobility aids) if they are needed. These include:  Canes.  Walkers.  Scooters.  Crutches.  Turn on the lights when you go into a dark area. Replace any light bulbs as soon as they burn out.  Set up your furniture so you have a clear path. Avoid moving your furniture around.  If any of your floors are uneven, fix them.  If there are any pets around you, be aware of where they are.  Review your medicines with your doctor. Some medicines can make you feel dizzy. This can increase your chance of falling. Ask your doctor what other things that you can do to help prevent falls. This information is not intended to replace advice given to you by your health care provider. Make sure you discuss any questions you have with your health care provider. Document Released: 06/05/2009 Document Revised: 01/15/2016 Document Reviewed: 09/13/2014 Elsevier Interactive Patient Education  2017 [employer].

## 2020-05-15 ENCOUNTER — Other Ambulatory Visit (INDEPENDENT_AMBULATORY_CARE_PROVIDER_SITE_OTHER): Payer: Self-pay | Admitting: Nurse Practitioner

## 2020-05-15 DIAGNOSIS — I83223 Varicose veins of left lower extremity with both ulcer of ankle and inflammation: Secondary | ICD-10-CM

## 2020-05-16 ENCOUNTER — Other Ambulatory Visit: Payer: Self-pay

## 2020-05-16 ENCOUNTER — Encounter (INDEPENDENT_AMBULATORY_CARE_PROVIDER_SITE_OTHER): Payer: Self-pay | Admitting: Nurse Practitioner

## 2020-05-16 ENCOUNTER — Ambulatory Visit (INDEPENDENT_AMBULATORY_CARE_PROVIDER_SITE_OTHER): Payer: Medicare HMO | Admitting: Nurse Practitioner

## 2020-05-16 ENCOUNTER — Ambulatory Visit (INDEPENDENT_AMBULATORY_CARE_PROVIDER_SITE_OTHER): Payer: Medicare HMO

## 2020-05-16 VITALS — BP 144/85 | HR 58 | Ht 67.0 in | Wt 165.0 lb

## 2020-05-16 DIAGNOSIS — E785 Hyperlipidemia, unspecified: Secondary | ICD-10-CM

## 2020-05-16 DIAGNOSIS — L97321 Non-pressure chronic ulcer of left ankle limited to breakdown of skin: Secondary | ICD-10-CM | POA: Diagnosis not present

## 2020-05-16 DIAGNOSIS — I83223 Varicose veins of left lower extremity with both ulcer of ankle and inflammation: Secondary | ICD-10-CM | POA: Diagnosis not present

## 2020-05-16 DIAGNOSIS — L97329 Non-pressure chronic ulcer of left ankle with unspecified severity: Secondary | ICD-10-CM | POA: Diagnosis not present

## 2020-05-20 ENCOUNTER — Encounter (INDEPENDENT_AMBULATORY_CARE_PROVIDER_SITE_OTHER): Payer: Self-pay | Admitting: Nurse Practitioner

## 2020-05-20 NOTE — Progress Notes (Signed)
Subjective:    [name], female    DOB: 30-Jun-1930, 86 y.o.   MRN: [id number] Chief Complaint  Patient presents with  . New Patient (Initial Visit)    Varicose veins BLE reflux    The patient presents today on referral by Dr. Parks Ranger in regards to a slow healing venous ulceration.  The patient notes that this wound occurred several months ago following a bleeding episode.  The patient also notes that there is a number of varicosities in the area.  This bleeding was spontaneous although she does endorse the area being itchy at times.  She denies any worsening of her edema however she does have some edema present.  She denies any fever, chills, nausea, vomiting or diarrhea.  The area is currently scabbed over but it has been that way for several weeks with no evidence of healing.  Today noninvasive study showed no evidence of DVT or superficial venous thrombosis.  No evidence of deep venous insufficiency or superficial venous reflux.   Review of Systems  Cardiovascular: Positive for leg swelling.  Skin: Positive for wound.  Hematological: Bruises/bleeds easily.       Objective:   Physical Exam Vitals reviewed.  HENT:     Head: Normocephalic.  Cardiovascular:     Rate and Rhythm: Normal rate.     Pulses: Normal pulses.  Pulmonary:     Effort: Pulmonary effort is normal.  Musculoskeletal:     Right lower leg: Edema present.     Left lower leg: Edema present.  Skin:    General: Skin is warm and dry.     Findings: Bruising present.  Neurological:     Mental Status: She is alert and oriented to person, place, and time.  Psychiatric:        Mood and Affect: Mood normal.        Behavior: Behavior normal.        Thought Content: Thought content normal.        Judgment: Judgment normal.     BP (!) 144/85   Pulse (!) 58   Ht 5\' 7"  (1.702 m)   Wt 165 lb (74.8 kg)   BMI 25.84 kg/m   Past Medical History:  Diagnosis Date  . Acute postoperative pain  02/09/2018  . Arrhythmia, sinus node 04/03/2014  . Arthritis   . Arthritis   . Atrial fibrillation ([place])   . Back pain    lower back chronic  . Bradycardia   . Breast cancer ([place]) 2004   right breast cancer  . Cancer [facility]) 2004   rt breast cancer-post lumpectomy- chemo/rad  . CHF (congestive heart failure) ([place])   . Chronic back pain   . CKD (chronic kidney disease)   . COPD (chronic obstructive pulmonary disease) ([facility])    wears O2 at 2L via [place] at night  . Cystocele   . Decubitus ulcers   . Dehydration   . Gout   . Hematuria   . Hepatitis C   . Hyperlipidemia   . Hypertension   . Hypomagnesemia 06/25/2015  . OAB (overactive bladder)   . Overactive bladder   . Restless leg   . Shoulder pain, bilateral   . Urinary frequency   . UTI (lower urinary tract infection)   . Vaginal atrophy     Social History   Socioeconomic History  . Marital status: Single    Spouse name: Not on file  . Number of children: 2  . Years  of education: 3  . Highest education level: 12th grade  Occupational History  . Occupation: retired  Tobacco Use  . Smoking status: Former Smoker    Packs/day: 1.25    Years: 40.00    Pack years: 50.00    Types: Cigarettes    Quit date: 12/16/1998    Years since quitting: 21.4  . Smokeless tobacco: Former Network engineer  . Vaping Use: Never used  Substance and Sexual Activity  . Alcohol use: No    Alcohol/week: 0.0 standard drinks  . Drug use: Yes    Types: Oxycodone  . Sexual activity: Not Currently    Birth control/protection: Surgical  Other Topics Concern  . Not on file  Social History Narrative  . Not on file   Social Determinants of Health   Financial Resource Strain: Low Risk   . Difficulty of Paying Living Expenses: Not hard at all  Food Insecurity: No Food Insecurity  . Worried About Charity fundraiser in the Last Year: Never true  . Ran Out of Food in the Last Year: Never true  Transportation Needs: No Transportation Needs  .  Lack of Transportation (Medical): No  . Lack of Transportation (Non-Medical): No  Physical Activity: Inactive  . Days of Exercise per Week: 0 days  . Minutes of Exercise per Session: 0 min  Stress: No Stress Concern Present  . Feeling of Stress : Not at all  Social Connections:   . Frequency of Communication with Friends and Family: Not on file  . Frequency of Social Gatherings with Friends and Family: Not on file  . Attends Religious Services: Not on file  . Active Member of Clubs or Organizations: Not on file  . Attends Archivist Meetings: Not on file  . Marital Status: Not on file  Intimate Partner Violence:   . Fear of Current or Ex-Partner: Not on file  . Emotionally Abused: Not on file  . Physically Abused: Not on file  . Sexually Abused: Not on file    Past Surgical History:  Procedure Laterality Date  . ABDOMINAL HYSTERECTOMY    . BACK SURGERY    . BREAST LUMPECTOMY Right 2004  . BREAST SURGERY     rt lumpectomy  . CARPAL TUNNEL RELEASE    . CATARACT EXTRACTION W/PHACO  10/19/2011   Procedure: CATARACT EXTRACTION PHACO AND INTRAOCULAR LENS PLACEMENT (IOC);  Surgeon: Elta Guadeloupe T. Gershon Crane, MD;  Location: [facility];  Service: Ophthalmology;  Laterality: Right;  CDE:10.81  . CATARACT EXTRACTION W/PHACO  11/02/2011   Procedure: CATARACT EXTRACTION PHACO AND INTRAOCULAR LENS PLACEMENT (IOC);  Surgeon: Elta Guadeloupe T. Gershon Crane, MD;  Location: [facility];  Service: Ophthalmology;  Laterality: Left;  CDE 8.60  . CHOLECYSTECTOMY     3/18  . COLON SURGERY    . INSERT / REPLACE / REMOVE PACEMAKER    . JOINT REPLACEMENT     bilateral TKA  . PACEMAKER INSERTION N/A 12/24/2015   Procedure: INSERTION PACEMAKER;  Surgeon: Isaias Cowman, MD;  Location: [facility] OR[facility];  Service: Cardiovascular;  Laterality: N/A;  . ROTATOR CUFF REPAIR     bilateral    Family History  Problem Relation Age of Onset  . Kidney disease Brother   . Heart disease Father   . Heart disease Mother   . Stroke Mother    . Cancer Sister   . Breast cancer Sister 33  . Breast cancer Paternal Aunt   . Anesthesia problems Neg Hx   . Hypotension Neg  Hx   . Malignant hyperthermia Neg Hx   . Pseudochol deficiency Neg Hx     Allergies  Allergen Reactions  . Flexeril [Cyclobenzaprine Hcl]     Severe Rash  . Vancomycin Rash    [place] Syndrome       Assessment & Plan:   1. Hyperlipidemia, unspecified hyperlipidemia type Continue statin as ordered and reviewed, no changes at this time   2. Varicose veins of left lower extremity with inflammation, with ulcer of ankle limited to breakdown of skin ([place]) The patient's noninvasive studies did not show evidence of venous reflux she does have some prominent varicosities near the ankle where her ulceration is.  We have done an emergent sclerotherapy around this area to try to decrease pressure and stop any further bleeding episodes.  We will also place the patient in an Unna wrap in order to help control the edema and allow the wound time to heal.  Patient will present to the office on a weekly basis for Unna wrap evaluation.  We will have the patient present to the office in 4 weeks for evaluation of the wound.     Current Outpatient Medications on File Prior to Visit  Medication Sig Dispense Refill  . acetaminophen (TYLENOL) 500 MG tablet Take 500 mg by mouth 3 (three) times daily.    Marland Kitchen albuterol (PROVENTIL) (2.5 MG/3ML) 0.083% nebulizer solution Take 3 mLs (2.5 mg total) by nebulization every 8 (eight) hours as needed for wheezing or shortness of breath. 75 mL 3  . apixaban (ELIQUIS) 5 MG TABS tablet Take 1 tablet (5 mg total) by mouth 2 (two) times daily. 60 tablet 0  . bisacodyl (DULCOLAX) 5 MG EC tablet Take 2 tablets (10 mg total) by mouth daily as needed for moderate constipation. 60 tablet 11  . Calcium Carbonate-Vitamin D 600-200 MG-UNIT TABS Take by mouth.    . Cholecalciferol (VITAMIN D) 2000 units tablet Take 2,000 Units by mouth daily.     .  furosemide (LASIX) 20 MG tablet TAKE (1/2) TABLET BY MOUTH ONCE DAILY. 45 tablet 4  . gabapentin (NEURONTIN) 100 MG capsule Start 1 capsule daily, increase by 1 cap every 2-3 days as tolerated up to 3 times a day, or may take 3 at once in evening. 90 capsule 1  . levocetirizine (XYZAL) 5 MG tablet Take 0.5 tablets (2.5 mg total) by mouth every other day. 23 tablet 4  . lisinopril (ZESTRIL) 10 MG tablet Take 1 tablet (10 mg total) by mouth daily. 90 tablet 3  . [START ON 05/28/2020] Melatonin 10 MG CAPS Take 10 mg by mouth at bedtime. 30 capsule 5  . mupirocin ointment (BACTROBAN) 2 % Apply 1 application topically 2 (two) times daily as needed. For up to 7-10 days as needed to help heal and prevent infection. 22 g 2  . Oxycodone HCl 20 MG TABS Take 1 tablet (20 mg total) by mouth every 8 (eight) hours as needed. Must last 30 days 90 tablet 0  . [START ON 05/27/2020] Oxycodone HCl 20 MG TABS Take 1 tablet (20 mg total) by mouth every 8 (eight) hours as needed. Must last 30 days 90 tablet 0  . OXYGEN Inhale 2 L into the lungs at bedtime.     . polyethylene glycol (MIRALAX / GLYCOLAX) 17 g packet Take 17 g by mouth daily as needed for moderate constipation. Must last 30 days 30 packet 11  . rOPINIRole (REQUIP) 2 MG tablet Take 2 tablets (4 mg  total) by mouth at bedtime. Must last 30 days 60 tablet 5  . Wheat Dextrin (BENEFIBER) POWD Take 6 g by mouth 3 (three) times daily before meals. (2 tsp = 6 g) 730 g 8  . Oxycodone HCl 20 MG TABS Take 1 tablet (20 mg total) by mouth every 8 (eight) hours as needed. Must last 30 days (Patient not taking: Reported on 04/01/2020) 90 tablet 0   No current facility-administered medications on file prior to visit.    There are no Patient Instructions on file for this visit. No follow-ups on file.   Kris Hartmann, NP

## 2020-05-23 ENCOUNTER — Inpatient Hospital Stay (HOSPITAL_COMMUNITY)
Admission: EM | Admit: 2020-05-23 | Discharge: 2020-05-25 | DRG: 193 | Disposition: A | Discharge status: Home Health | Payer: Medicare HMO | Attending: Internal Medicine | Admitting: Internal Medicine

## 2020-05-23 ENCOUNTER — Ambulatory Visit: Payer: Self-pay | Admitting: *Deleted

## 2020-05-23 ENCOUNTER — Other Ambulatory Visit: Payer: Self-pay

## 2020-05-23 ENCOUNTER — Encounter (HOSPITAL_COMMUNITY): Payer: Self-pay | Admitting: Emergency Medicine

## 2020-05-23 ENCOUNTER — Emergency Department (HOSPITAL_COMMUNITY): Payer: Medicare HMO

## 2020-05-23 DIAGNOSIS — I5032 Chronic diastolic (congestive) heart failure: Secondary | ICD-10-CM | POA: Diagnosis not present

## 2020-05-23 DIAGNOSIS — R062 Wheezing: Secondary | ICD-10-CM | POA: Diagnosis not present

## 2020-05-23 DIAGNOSIS — N3281 Overactive bladder: Secondary | ICD-10-CM | POA: Diagnosis not present

## 2020-05-23 DIAGNOSIS — M109 Gout, unspecified: Secondary | ICD-10-CM | POA: Diagnosis present

## 2020-05-23 DIAGNOSIS — J189 Pneumonia, unspecified organism: Principal | ICD-10-CM | POA: Diagnosis present

## 2020-05-23 DIAGNOSIS — J440 Chronic obstructive pulmonary disease with acute lower respiratory infection: Secondary | ICD-10-CM | POA: Diagnosis present

## 2020-05-23 DIAGNOSIS — Z96653 Presence of artificial knee joint, bilateral: Secondary | ICD-10-CM | POA: Diagnosis present

## 2020-05-23 DIAGNOSIS — Z853 Personal history of malignant neoplasm of breast: Secondary | ICD-10-CM

## 2020-05-23 DIAGNOSIS — R059 Cough, unspecified: Secondary | ICD-10-CM | POA: Diagnosis not present

## 2020-05-23 DIAGNOSIS — Z841 Family history of disorders of kidney and ureter: Secondary | ICD-10-CM

## 2020-05-23 DIAGNOSIS — Z87440 Personal history of urinary (tract) infections: Secondary | ICD-10-CM

## 2020-05-23 DIAGNOSIS — J811 Chronic pulmonary edema: Secondary | ICD-10-CM | POA: Diagnosis not present

## 2020-05-23 DIAGNOSIS — Z8249 Family history of ischemic heart disease and other diseases of the circulatory system: Secondary | ICD-10-CM

## 2020-05-23 DIAGNOSIS — Z8614 Personal history of Methicillin resistant Staphylococcus aureus infection: Secondary | ICD-10-CM | POA: Diagnosis not present

## 2020-05-23 DIAGNOSIS — R531 Weakness: Secondary | ICD-10-CM | POA: Diagnosis not present

## 2020-05-23 DIAGNOSIS — I130 Hypertensive heart and chronic kidney disease with heart failure and stage 1 through stage 4 chronic kidney disease, or unspecified chronic kidney disease: Secondary | ICD-10-CM | POA: Diagnosis not present

## 2020-05-23 DIAGNOSIS — R0902 Hypoxemia: Secondary | ICD-10-CM | POA: Diagnosis not present

## 2020-05-23 DIAGNOSIS — R001 Bradycardia, unspecified: Secondary | ICD-10-CM | POA: Diagnosis not present

## 2020-05-23 DIAGNOSIS — Z9981 Dependence on supplemental oxygen: Secondary | ICD-10-CM | POA: Diagnosis not present

## 2020-05-23 DIAGNOSIS — Z87891 Personal history of nicotine dependence: Secondary | ICD-10-CM | POA: Diagnosis not present

## 2020-05-23 DIAGNOSIS — Z803 Family history of malignant neoplasm of breast: Secondary | ICD-10-CM | POA: Diagnosis not present

## 2020-05-23 DIAGNOSIS — J9621 Acute and chronic respiratory failure with hypoxia: Secondary | ICD-10-CM | POA: Diagnosis present

## 2020-05-23 DIAGNOSIS — R4182 Altered mental status, unspecified: Secondary | ICD-10-CM | POA: Diagnosis present

## 2020-05-23 DIAGNOSIS — Z23 Encounter for immunization: Secondary | ICD-10-CM

## 2020-05-23 DIAGNOSIS — R0602 Shortness of breath: Secondary | ICD-10-CM | POA: Diagnosis not present

## 2020-05-23 DIAGNOSIS — Z823 Family history of stroke: Secondary | ICD-10-CM

## 2020-05-23 DIAGNOSIS — J9601 Acute respiratory failure with hypoxia: Secondary | ICD-10-CM

## 2020-05-23 DIAGNOSIS — Z950 Presence of cardiac pacemaker: Secondary | ICD-10-CM | POA: Diagnosis not present

## 2020-05-23 DIAGNOSIS — R778 Other specified abnormalities of plasma proteins: Secondary | ICD-10-CM | POA: Diagnosis present

## 2020-05-23 DIAGNOSIS — I361 Nonrheumatic tricuspid (valve) insufficiency: Secondary | ICD-10-CM | POA: Diagnosis not present

## 2020-05-23 DIAGNOSIS — Z20822 Contact with and (suspected) exposure to covid-19: Secondary | ICD-10-CM | POA: Diagnosis present

## 2020-05-23 DIAGNOSIS — I517 Cardiomegaly: Secondary | ICD-10-CM | POA: Diagnosis not present

## 2020-05-23 DIAGNOSIS — E785 Hyperlipidemia, unspecified: Secondary | ICD-10-CM | POA: Diagnosis present

## 2020-05-23 DIAGNOSIS — N1832 Chronic kidney disease, stage 3b: Secondary | ICD-10-CM | POA: Diagnosis present

## 2020-05-23 DIAGNOSIS — I4891 Unspecified atrial fibrillation: Secondary | ICD-10-CM | POA: Diagnosis present

## 2020-05-23 DIAGNOSIS — J9691 Respiratory failure, unspecified with hypoxia: Secondary | ICD-10-CM | POA: Diagnosis present

## 2020-05-23 DIAGNOSIS — J441 Chronic obstructive pulmonary disease with (acute) exacerbation: Secondary | ICD-10-CM | POA: Diagnosis not present

## 2020-05-23 DIAGNOSIS — I959 Hypotension, unspecified: Secondary | ICD-10-CM | POA: Diagnosis not present

## 2020-05-23 LAB — BASIC METABOLIC PANEL
Anion gap: 11 (ref 5–15)
BUN: 28 mg/dL — ABNORMAL HIGH (ref 8–23)
CO2: 21 mmol/L — ABNORMAL LOW (ref 22–32)
Calcium: 9.2 mg/dL (ref 8.9–10.3)
Chloride: 103 mmol/L (ref 98–111)
Creatinine, Ser: 1.57 mg/dL — ABNORMAL HIGH (ref 0.44–1.00)
GFR calc Af Amer: 34 mL/min — ABNORMAL LOW (ref >60)
GFR calc non Af Amer: 29 mL/min — ABNORMAL LOW (ref >60)
Glucose, Bld: 106 mg/dL — ABNORMAL HIGH (ref 70–99)
Potassium: 3.8 mmol/L (ref 3.5–5.1)
Sodium: 135 mmol/L (ref 135–145)

## 2020-05-23 LAB — RESPIRATORY PANEL BY RT PCR (FLU A&B, COVID)
Influenza A by PCR: NEGATIVE
Influenza B by PCR: NEGATIVE
SARS Coronavirus 2 by RT PCR: NEGATIVE

## 2020-05-23 LAB — CBC
HCT: 39.5 % (ref 36.0–46.0)
Hemoglobin: 12.3 g/dL (ref 12.0–15.0)
MCH: 30.9 pg (ref 26.0–34.0)
MCHC: 31.1 g/dL (ref 30.0–36.0)
MCV: 99.2 fL (ref 80.0–100.0)
Platelets: 157 10*3/uL (ref 150–400)
RBC: 3.98 MIL/uL (ref 3.87–5.11)
RDW: 13.7 % (ref 11.5–15.5)
WBC: 11.2 10*3/uL — ABNORMAL HIGH (ref 4.0–10.5)
nRBC: 0 % (ref 0.0–0.2)

## 2020-05-23 LAB — CBG MONITORING, ED: Glucose-Capillary: 90 mg/dL (ref 70–99)

## 2020-05-23 LAB — BRAIN NATRIURETIC PEPTIDE: B Natriuretic Peptide: 994 pg/mL — ABNORMAL HIGH (ref 0.0–100.0)

## 2020-05-23 LAB — TROPONIN I (HIGH SENSITIVITY): Troponin I (High Sensitivity): 36 ng/L — ABNORMAL HIGH (ref <18)

## 2020-05-23 MED ORDER — SODIUM CHLORIDE 0.9 % IV SOLN
1.0000 g | INTRAVENOUS | Status: DC
  Administered 2020-05-23 – 2020-05-24 (×2): 1 g via INTRAVENOUS
  Filled 2020-05-23 (×2): qty 10

## 2020-05-23 MED ORDER — SODIUM CHLORIDE 0.9 % IV SOLN
500.0000 mg | INTRAVENOUS | Status: DC
  Administered 2020-05-24 – 2020-05-25 (×2): 500 mg via INTRAVENOUS
  Filled 2020-05-23 (×2): qty 500

## 2020-05-23 NOTE — ED Provider Notes (Signed)
ATTENDING SUPERVISORY NOTE I have personally viewed the imaging studies performed. I have personally seen and examined the patient, and discussed the plan of care with the resident.  I have reviewed the documentation of the resident and agree.  No diagnosis found.  .Critical Care Performed by: Elnora Morrison, MD Authorized by: Elnora Morrison, MD   Critical care provider statement:    Critical care time (minutes):  40   Critical care start time:  05/23/2020 7:40 PM   Critical care time was exclusive of:  Separately billable procedures and treating other patients and teaching time   Critical care was time spent personally by me on the following activities:  Discussions with consultants, evaluation of patient's response to treatment, examination of patient, ordering and performing treatments and interventions, ordering and review of laboratory studies, ordering and review of radiographic studies, pulse oximetry and re-evaluation of patient's condition      Elnora Morrison, MD 05/24/20 [id number]

## 2020-05-23 NOTE — ED Provider Notes (Signed)
[facility] EMERGENCY DEPARTMENT Provider Note   CSN: [id number] Arrival date & time: 05/23/20  1806     History Chief Complaint  Patient presents with  . Weakness    Kristin [name] is a 86 y.o. female.  86 year old woman with past medical history of A. fib, CHF, pacemaker presents with hypoxia and weakness. Her granddaughter took her temperature with a mild fever to 100.6*F today. She states that she noticed a weakness for the last 4 to 5 days, she cannot identify any decrease in functionality, but she feels more unsteady.  She has walked with a cane for many years.  In the ED she was noted to be hypoxic to the 70s, return to SpO2 greater than 90 on 5 L [place]. Patient is able to speak in full sentences, not using accessory muscles, no increased work of breathing. Patient does not have any supplemental oxygen use at baseline.  She lives with family, denies any sick contacts, received both COVID-19 Moderna vaccines in January and February.  He reports some recent leg swelling had went to see the vein and vascular specialist for a wound on her left lateral malleolus.  They place an Haematologist.  Removed boot to examine leg which is healing well with scab over wound on left lateral malleolus.       Past Medical History:  Diagnosis Date  . Acute postoperative pain 02/09/2018  . Arrhythmia, sinus node 04/03/2014  . Arthritis   . Arthritis   . Atrial fibrillation ([place])   . Back pain    lower back chronic  . Bradycardia   . Breast cancer ([place]) 2004   right breast cancer  . Cancer [facility]) 2004   rt breast cancer-post lumpectomy- chemo/rad  . CHF (congestive heart failure) ([place])   . Chronic back pain   . CKD (chronic kidney disease)   . COPD (chronic obstructive pulmonary disease) (HCC)    wears O2 at 2L via [place] at night  . Cystocele   . Decubitus ulcers   . Dehydration   . Gout   . Hematuria   . Hepatitis C   . Hyperlipidemia   . Hypertension   . Hypomagnesemia 06/25/2015  . OAB  (overactive bladder)   . Overactive bladder   . Restless leg   . Shoulder pain, bilateral   . Urinary frequency   . UTI (lower urinary tract infection)   . Vaginal atrophy     Patient Active Problem List   Diagnosis Date Noted  . Varicose veins of left lower extremity with inflammation, with ulcer of ankle limited to breakdown of skin ([place]) 04/01/2020  . Uncomplicated opioid dependence ([place]) 02/14/2020  . Abnormal MRI, lumbar spine (11/03/2019) 01/23/2020  . Pharmacologic therapy 12/20/2019  . Other specified dorsopathies, sacral and sacrococcygeal region 06/12/2019  . Chronic hip pain (Left) 06/12/2019  . Osteoarthritis of hip (Left) 06/12/2019  . Greater trochanteric bursitis of hip (Left) 03/12/2019  . Preop testing 01/22/2019  . Cervicalgia 10/03/2018  . Chronic shoulders pain (Bilateral) (L>R) 05/29/2018  . Osteoarthritis of shoulder (Bilateral) 05/29/2018  . Osteoarthritis of hip (Bilateral) 05/29/2018  . Chronic shoulder pain after shoulder replacement (Bilateral) (L>R) 05/29/2018  . Trigger finger, left middle finger 05/29/2018  . Spondylosis without myelopathy or radiculopathy, lumbosacral region 02/09/2018  . Insomnia 02/01/2018  . Numbness and tingling of upper extremity (Bilateral) 09/07/2017  . Chronic shoulder pain, Radicular (Bilateral) (L>R) 09/07/2017  . Cervical facet syndrome (Bilateral) (L>R) 09/07/2017  . Cervical (3-4 mm) Anterolisthesis  of C4 on C5 09/07/2017  . Cervical foraminal stenosis (Bilateral) 09/07/2017  . Chronic lower extremity pain (Secondary Area of Pain) (Bilateral) (L>R) 05/13/2017  . DDD (degenerative disc disease), cervical 05/13/2017  . Chronic cervical radicular pain [facility] Area of Pain) (Bilateral) (L>R) 05/04/2017  . Chronic anticoagulation (Eliquis) 05/04/2017  . Chronic hip pain (Bilateral) 04/20/2017  . Chronic neck pain 03/01/2017  . Hyperlipidemia 02/02/2017  . Red blood cell antibody positive 11/18/2016  . Anticoagulated  11/17/2016  . Atrial fibrillation and flutter ([place]) 11/17/2016  . Osteoarthritis involving multiple joints 06/14/2016  . Cardiac pacemaker in situ 01/07/2016  . S/P cardiac pacemaker procedure 01/07/2016  . GERD (gastroesophageal reflux disease) 12/18/2015  . Chronic pain syndrome 06/25/2015  . Chronic low back pain (Primary Area of Pain) (Bilateral) (L>R) 06/25/2015  . Lumbar facet syndrome (Bilateral) (L>R) 06/25/2015  . Lumbar spondylosis 06/25/2015  . Chronic lumbar radicular pain (Bilateral) (L>R) 06/25/2015  . Restless leg syndrome 06/25/2015  . Myofascial pain 06/25/2015  . Neurogenic pain 06/25/2015  . Long term current use of opiate analgesic 06/25/2015  . Long term prescription opiate use 06/25/2015  . Opiate use (90 MME/Day) 06/25/2015  . Opioid-induced constipation (OIC) 06/25/2015  . Vitamin D insufficiency 06/25/2015  . Grade 1 Retrolisthesis of L1 over L2 06/25/2015  . Lumbar facet hypertrophy (L1-2, L2-3, and L4-5) 06/25/2015  . Lumbar lateral recess stenosis (L1-2 and L4-5) 06/25/2015  . 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 Epidural fibrosis 06/25/2015  . Lumbar foraminal stenosis (Severe Left L4-5) 06/25/2015  . Osteoporosis 06/25/2015  . Chronic sacroiliac joint pain (Left) 06/25/2015  . History of methicillin resistant staphylococcus aureus (MRSA) 06/25/2015  . Mitral valve prolapse 06/25/2015  . Seasonal allergies 06/17/2015  . History of breast cancer 06/17/2015  . Chronic diastolic CHF (congestive heart failure) ([place]) 12/26/2014  . Centrilobular emphysema ([place]) 09/16/2014  . S/P cardiac catheterization 04/04/2014  . DDD (degenerative disc disease), lumbosacral 04/03/2014  . Benign hypertension with CKD (chronic kidney disease) stage III ([place]) 04/03/2014  . Lumbar central spinal stenosis 04/03/2014  . Chronic kidney disease (CKD), stage III (moderate) ([place]) 10/30/2013  . Gout 10/30/2013    Past Surgical  History:  Procedure Laterality Date  . ABDOMINAL HYSTERECTOMY    . BACK SURGERY    . BREAST LUMPECTOMY Right 2004  . BREAST SURGERY     rt lumpectomy  . CARPAL TUNNEL RELEASE    . CATARACT EXTRACTION W/PHACO  10/19/2011   Procedure: CATARACT EXTRACTION PHACO AND INTRAOCULAR LENS PLACEMENT (IOC);  Surgeon: Elta Guadeloupe T. Gershon Crane, MD;  Location: [facility];  Service: Ophthalmology;  Laterality: Right;  CDE:10.81  . CATARACT EXTRACTION W/PHACO  11/02/2011   Procedure: CATARACT EXTRACTION PHACO AND INTRAOCULAR LENS PLACEMENT (IOC);  Surgeon: Elta Guadeloupe T. Gershon Crane, MD;  Location: [facility];  Service: Ophthalmology;  Laterality: Left;  CDE 8.60  . CHOLECYSTECTOMY     3/18  . COLON SURGERY    . INSERT / REPLACE / REMOVE PACEMAKER    . JOINT REPLACEMENT     bilateral TKA  . PACEMAKER INSERTION N/A 12/24/2015   Procedure: INSERTION PACEMAKER;  Surgeon: Isaias Cowman, MD;  Location: [facility] ORS;  Service: Cardiovascular;  Laterality: N/A;  . ROTATOR CUFF REPAIR     bilateral     OB History   No obstetric history on file.     Family History  Problem Relation Age of Onset  . Kidney disease Brother   . Heart disease Father   .  Heart disease Mother   . Stroke Mother   . Cancer Sister   . Breast cancer Sister 28  . Breast cancer Paternal Aunt   . Anesthesia problems Neg Hx   . Hypotension Neg Hx   . Malignant hyperthermia Neg Hx   . Pseudochol deficiency Neg Hx     Social History   Tobacco Use  . Smoking status: Former Smoker    Packs/day: 1.25    Years: 40.00    Pack years: 50.00    Types: Cigarettes    Quit date: 12/16/1998    Years since quitting: 21.4  . Smokeless tobacco: Former Network engineer  . Vaping Use: Never used  Substance Use Topics  . Alcohol use: No    Alcohol/week: 0.0 standard drinks  . Drug use: Yes    Types: Oxycodone    Home Medications Prior to Admission medications   Medication Sig Start Date End Date Taking? Authorizing Provider  acetaminophen (TYLENOL) 500 MG  tablet Take 500 mg by mouth 3 (three) times daily.    [provider]  albuterol (PROVENTIL) (2.5 MG/3ML) 0.083% nebulizer solution Take 3 mLs (2.5 mg total) by nebulization every 8 (eight) hours as needed for wheezing or shortness of breath. 10/02/19 10/01/20  Karamalegos, Devonne Doughty, DO  apixaban (ELIQUIS) 5 MG TABS tablet Take 1 tablet (5 mg total) by mouth 2 (two) times daily. 10/30/15   Loletha Grayer, MD  bisacodyl (DULCOLAX) 5 MG EC tablet Take 2 tablets (10 mg total) by mouth daily as needed for moderate constipation. 08/01/19 07/31/20  Milinda Pointer, MD  Calcium Carbonate-Vitamin D 600-200 MG-UNIT TABS Take by mouth.    [provider]  Cholecalciferol (VITAMIN D) 2000 units tablet Take 2,000 Units by mouth daily.     [provider]  furosemide (LASIX) 20 MG tablet TAKE (1/2) TABLET BY MOUTH ONCE DAILY. 05/08/19   Mikey College, NP  gabapentin (NEURONTIN) 100 MG capsule Start 1 capsule daily, increase by 1 cap every 2-3 days as tolerated up to 3 times a day, or may take 3 at once in evening. 04/29/20   Karamalegos, Devonne Doughty, DO  levocetirizine (XYZAL) 5 MG tablet Take 0.5 tablets (2.5 mg total) by mouth every other day. 05/17/19   Mikey College, NP  lisinopril (ZESTRIL) 10 MG tablet Take 1 tablet (10 mg total) by mouth daily. 05/08/19   Mikey College, NP  Melatonin 10 MG CAPS Take 10 mg by mouth at bedtime. 05/28/20 11/24/20  Milinda Pointer, MD  mupirocin ointment (BACTROBAN) 2 % Apply 1 application topically 2 (two) times daily as needed. For up to 7-10 days as needed to help heal and prevent infection. 04/01/20   Karamalegos, Devonne Doughty, DO  Oxycodone HCl 20 MG TABS Take 1 tablet (20 mg total) by mouth every 8 (eight) hours as needed. Must last 30 days Patient not taking: Reported on 04/01/2020 03/28/20 04/27/20  Milinda Pointer, MD  Oxycodone HCl 20 MG TABS Take 1 tablet (20 mg total) by mouth every 8 (eight) hours as needed. Must last 30  days 04/27/20 05/27/20  Milinda Pointer, MD  Oxycodone HCl 20 MG TABS Take 1 tablet (20 mg total) by mouth every 8 (eight) hours as needed. Must last 30 days 05/27/20 06/26/20  Milinda Pointer, MD  OXYGEN Inhale 2 L into the lungs at bedtime.     [provider]  polyethylene glycol (MIRALAX / GLYCOLAX) 17 g packet Take 17 g by mouth daily as needed  for moderate constipation. Must last 30 days 08/01/19 07/31/20  Milinda Pointer, MD  rOPINIRole (REQUIP) 2 MG tablet Take 2 tablets (4 mg total) by mouth at bedtime. Must last 30 days 01/28/20 07/26/20  Milinda Pointer, MD  Wheat Dextrin (BENEFIBER) POWD Take 6 g by mouth 3 (three) times daily before meals. (2 tsp = 6 g) 08/01/19 07/31/20  Milinda Pointer, MD    Allergies    Flexeril [cyclobenzaprine hcl] and Vancomycin  Review of Systems   Review of Systems  Constitutional: Positive for fatigue and fever. Negative for chills.  HENT: Negative for congestion, rhinorrhea and sore throat.   Respiratory: Positive for cough and shortness of breath. Negative for wheezing.   Cardiovascular: Positive for leg swelling. Negative for chest pain.  Gastrointestinal: Negative for abdominal pain, constipation, diarrhea, nausea and vomiting.  Genitourinary: Negative for dysuria.  Musculoskeletal: Negative for arthralgias.  Skin: Positive for wound.  Neurological: Positive for weakness. Negative for dizziness and headaches.    Physical Exam Updated Vital Signs BP (!) 119/99 (BP Location: Left Arm)   Pulse 65   Temp 98.6 F (37 C) (Oral)   Resp 13   Ht 5\' 7"  (1.702 m)   Wt 75 kg   SpO2 94%   BMI 25.90 kg/m   Physical Exam Vitals and nursing note reviewed.  Constitutional:      General: She is not in acute distress.    Appearance: Normal appearance. She is normal weight. She is not ill-appearing, toxic-appearing or diaphoretic.  HENT:     Head: Normocephalic and atraumatic.  Cardiovascular:     Rate and Rhythm: Normal rate. Rhythm  irregular.     Heart sounds: Normal heart sounds. No murmur heard.  No friction rub. No gallop.      Comments: A fib with pacemaker Pulmonary:     Effort: Pulmonary effort is normal. No respiratory distress.     Breath sounds: Rhonchi and rales present. No wheezing.     Comments: Good air flow in all lung fields with diffuse bilateral rales and rhonchi. Speaking in full sentences. Abdominal:     General: Abdomen is flat. Bowel sounds are normal. There is no distension.     Palpations: Abdomen is soft.     Tenderness: There is no abdominal tenderness. There is no guarding.  Musculoskeletal:     Right lower leg: Edema present.     Left lower leg: No edema.     Comments: Trace RLE edema, healing wound on L lateral malleolus  Skin:    General: Skin is warm and dry.     Capillary Refill: Capillary refill takes less than 2 seconds.  Neurological:     General: No focal deficit present.     Mental Status: She is alert and oriented to person, place, and time. Mental status is at baseline.  Psychiatric:        Mood and Affect: Mood normal.        Behavior: Behavior normal.     ED Results / Procedures / Treatments   Labs (all labs ordered are listed, but only abnormal results are displayed) Labs Reviewed  BASIC METABOLIC PANEL - Abnormal; Notable for the following components:      Result Value   CO2 21 (*)    Glucose, Bld 106 (*)    BUN 28 (*)    Creatinine, Ser 1.57 (*)    GFR calc non Af Amer 29 (*)    GFR calc Af Amer 34 (*)  All other components within normal limits  CBC - Abnormal; Notable for the following components:   WBC 11.2 (*)    All other components within normal limits  BRAIN NATRIURETIC PEPTIDE - Abnormal; Notable for the following components:   B Natriuretic Peptide 994.0 (*)    All other components within normal limits  TROPONIN I (HIGH SENSITIVITY) - Abnormal; Notable for the following components:   Troponin I (High Sensitivity) 36 (*)    All other components  within normal limits  RESPIRATORY PANEL BY RT PCR (FLU A&B, COVID)  URINALYSIS, ROUTINE W REFLEX MICROSCOPIC  CBG MONITORING, ED  TROPONIN I (HIGH SENSITIVITY)    EKG EKG Interpretation  Date/Time:  Friday May 23 2020 19:53:13 EDT Ventricular Rate:  76 PR Interval:    QRS Duration: 150 QT Interval:  415 QTC Calculation: 467 R Axis:   -72 Text Interpretation: Atrial fibrillation RBBB and LAFB Confirmed by Elnora Morrison [phone]) on 05/23/2020 7:55:34 PM   Radiology DG Chest Portable 1 View  Result Date: 05/23/2020 CLINICAL DATA:  Weakness and shortness of breath. EXAM: PORTABLE CHEST 1 VIEW COMPARISON:  December 06, 2017 FINDINGS: There is a dual lead AICD. Stable, mild to moderate severity chronic appearing diffusely increased interstitial lung markings are noted. Mild areas of superimposed atelectasis and/or infiltrate are seen within the bilateral lung bases. There is no evidence of a pleural effusion or pneumothorax. The cardiac silhouette is moderately enlarged. There is moderate severity calcification of the aortic arch. Bilateral shoulder replacements are seen. IMPRESSION: 1. Stable, mild to moderate severity chronic appearing increased interstitial lung markings with mild superimposed bibasilar atelectasis and/or infiltrate. 2. Stable cardiomegaly. 3. Aortic atherosclerosis. Electronically Signed   By: Virgina Norfolk M.D.   On: 05/23/2020 21:09    Procedures Procedures (including critical care time)  Medications Ordered in ED Medications - No data to display  ED Course  I have reviewed the triage vital signs and the nursing notes.  Pertinent labs & imaging results that were available during my care of the patient were reviewed by me and considered in my medical decision making (see chart for details).  2015: Most likely a respiratory illness, suspect COVID-19 due to prevalence and with symptoms of hypoxia, dry cough, fever. Awaiting PCR results, CXR pending. Will obtain  CBC, BMP. CHF exacerbation and/or pulmonary edema also on differential, will obtain BNP as well. Clinical Course as of May 23 2330  Fri May 23, 2020  2309 Patient with mild leukocytosis, mildly elevated troponin, markedly elevated BNP, and CXR with atelectasis vs infiltrate. COVID-19 negative x1. With fever, hypoxia with new oxygen requirement, no e/o pulmonary edema, lean more toward pneumonia at this time, but also can consider CHF exacerbation. Will consult hospitalist for admission.   [CM]  2330 Handoff given to hospitalist.   [CM]    Clinical Course User Index [CM] Gladys Damme, MD   MDM Rules/Calculators/A&P                           Final Clinical Impression(s) / ED Diagnoses Final diagnoses:  Acute respiratory failure with hypoxia [facility])    Rx / DC Orders ED Discharge Orders    None       Gladys Damme, MD 05/23/20 0102    Elnora Morrison, MD 05/24/20 [phone]

## 2020-05-23 NOTE — ED Triage Notes (Signed)
Pt brought in by [facility] after family called stating pt was weak and had a low BP. Per EMS, pt's BP was 122/64 but O2 sats on room air were 85% so they palced pt on O2 at 2L nasal cannula and sats increased to 93%.

## 2020-05-23 NOTE — Telephone Encounter (Signed)
Patient's daughter is calling with concerns over patient weakness and fatigue. Due to symptoms and decreased BP- advised ED.  Reason for Disposition  [8] Systolic BP < 90 AND [7] dizzy, lightheaded, or weak  Answer Assessment - Initial Assessment Questions 1. DESCRIPTION: "Describe how you are feeling."     No energy- claims to be fine 2. SEVERITY: "How bad is it?"  "Can you stand and walk?"   - MILD - Feels weak or tired, but does not interfere with work, school or normal activities   - [place] to stand and walk; weakness interferes with work, school, or normal activities   - SEVERE - Unable to stand or walk     moderate 3. ONSET:  "When did the weakness begin?"     2 days 4. CAUSE: "What do you think is causing the weakness?"     unsure 5. MEDICINES: "Have you recently started a new medicine or had a change in the amount of a medicine?"     No changes in medictaion 6. OTHER SYMPTOMS: "Do you have any other symptoms?" (e.g., chest pain, fever, cough, SOB, vomiting, diarrhea, bleeding, other areas of pain)     Cough seems to be worse 7. PREGNANCY: "Is there any chance you are pregnant?" "When was your last menstrual period?"     n/a  Answer Assessment - Initial Assessment Questions 1. BLOOD PRESSURE: "What is the blood pressure?" "Did you take at least two measurements 5 minutes apart?"     77/67 2. ONSET: "When did you take your blood pressure?"     4:30 3. HOW: "How did you obtain the blood pressure?" (e.g., visiting nurse, automatic home BP monitor)     Automatic cuff 4. HISTORY: "Do you have a history of low blood pressure?" "What is your blood pressure normally?"     no 5. MEDICATIONS: "Are you taking any medications for blood pressure?" If Yes, ask: "Have they been changed recently?"     No chnages 6. PULSE RATE: "Do you know what your pulse rate is?"      66 7. OTHER SYMPTOMS: "Have you been sick recently?" "Have you had a recent injury?"     Not asked 8.  PREGNANCY: "Is there any chance you are pregnant?" "When was your last menstrual period?"     n/a  Protocols used: BLOOD PRESSURE - LOW-A-AH, WEAKNESS (GENERALIZED) AND FATIGUE-A-AH

## 2020-05-23 NOTE — ED Notes (Signed)
ED Provider at bedside. 

## 2020-05-24 ENCOUNTER — Inpatient Hospital Stay (HOSPITAL_COMMUNITY): Payer: Medicare HMO

## 2020-05-24 DIAGNOSIS — J9601 Acute respiratory failure with hypoxia: Secondary | ICD-10-CM

## 2020-05-24 DIAGNOSIS — I361 Nonrheumatic tricuspid (valve) insufficiency: Secondary | ICD-10-CM

## 2020-05-24 DIAGNOSIS — J9621 Acute and chronic respiratory failure with hypoxia: Secondary | ICD-10-CM

## 2020-05-24 LAB — ECHOCARDIOGRAM COMPLETE
AR max vel: 1.89 cm2
AV Area VTI: 1.66 cm2
AV Area mean vel: 1.85 cm2
AV Mean grad: 9 mmHg
AV Peak grad: 17.8 mmHg
Ao pk vel: 2.11 m/s
Area-P 1/2: 1.88 cm2
Height: 67 in
S' Lateral: 3.11 cm
Weight: 2645.52 oz

## 2020-05-24 LAB — COMPREHENSIVE METABOLIC PANEL
ALT: 15 U/L (ref 0–44)
AST: 20 U/L (ref 15–41)
Albumin: 3 g/dL — ABNORMAL LOW (ref 3.5–5.0)
Alkaline Phosphatase: 52 U/L (ref 38–126)
Anion gap: 7 (ref 5–15)
BUN: 26 mg/dL — ABNORMAL HIGH (ref 8–23)
CO2: 21 mmol/L — ABNORMAL LOW (ref 22–32)
Calcium: 8.4 mg/dL — ABNORMAL LOW (ref 8.9–10.3)
Chloride: 108 mmol/L (ref 98–111)
Creatinine, Ser: 1.32 mg/dL — ABNORMAL HIGH (ref 0.44–1.00)
GFR calc Af Amer: 42 mL/min — ABNORMAL LOW (ref >60)
GFR calc non Af Amer: 36 mL/min — ABNORMAL LOW (ref >60)
Glucose, Bld: 123 mg/dL — ABNORMAL HIGH (ref 70–99)
Potassium: 3.8 mmol/L (ref 3.5–5.1)
Sodium: 136 mmol/L (ref 135–145)
Total Bilirubin: 1.2 mg/dL (ref 0.3–1.2)
Total Protein: 6.2 g/dL — ABNORMAL LOW (ref 6.5–8.1)

## 2020-05-24 LAB — CBC
HCT: 34 % — ABNORMAL LOW (ref 36.0–46.0)
Hemoglobin: 10.9 g/dL — ABNORMAL LOW (ref 12.0–15.0)
MCH: 31.1 pg (ref 26.0–34.0)
MCHC: 32.1 g/dL (ref 30.0–36.0)
MCV: 97.1 fL (ref 80.0–100.0)
Platelets: 144 10*3/uL — ABNORMAL LOW (ref 150–400)
RBC: 3.5 MIL/uL — ABNORMAL LOW (ref 3.87–5.11)
RDW: 13.6 % (ref 11.5–15.5)
WBC: 10.6 10*3/uL — ABNORMAL HIGH (ref 4.0–10.5)
nRBC: 0 % (ref 0.0–0.2)

## 2020-05-24 LAB — URINALYSIS, ROUTINE W REFLEX MICROSCOPIC
Bilirubin Urine: NEGATIVE
Glucose, UA: NEGATIVE mg/dL
Ketones, ur: NEGATIVE mg/dL
Nitrite: NEGATIVE
Protein, ur: 30 mg/dL — AB
Specific Gravity, Urine: 1.017 (ref 1.005–1.030)
pH: 6 (ref 5.0–8.0)

## 2020-05-24 LAB — BLOOD GAS, ARTERIAL
Acid-base deficit: 1.3 mmol/L (ref 0.0–2.0)
Bicarbonate: 23.5 mmol/L (ref 20.0–28.0)
FIO2: 40
O2 Saturation: 93.7 %
Patient temperature: 37
pCO2 arterial: 35.2 mmHg (ref 32.0–48.0)
pH, Arterial: 7.422 (ref 7.350–7.450)
pO2, Arterial: 69.4 mmHg — ABNORMAL LOW (ref 83.0–108.0)

## 2020-05-24 LAB — TSH: TSH: 1.653 u[IU]/mL (ref 0.350–4.500)

## 2020-05-24 LAB — TROPONIN I (HIGH SENSITIVITY)
Troponin I (High Sensitivity): 34 ng/L — ABNORMAL HIGH (ref <18)
Troponin I (High Sensitivity): 40 ng/L — ABNORMAL HIGH (ref <18)

## 2020-05-24 LAB — HIV ANTIBODY (ROUTINE TESTING W REFLEX): HIV Screen 4th Generation wRfx: NONREACTIVE

## 2020-05-24 MED ORDER — ROPINIROLE HCL 1 MG PO TABS
4.0000 mg | ORAL_TABLET | Freq: Every day | ORAL | Status: DC
  Administered 2020-05-24: 4 mg via ORAL
  Filled 2020-05-24 (×3): qty 4

## 2020-05-24 MED ORDER — GABAPENTIN 100 MG PO CAPS
100.0000 mg | ORAL_CAPSULE | Freq: Two times a day (BID) | ORAL | Status: DC
  Administered 2020-05-24 – 2020-05-25 (×3): 100 mg via ORAL
  Filled 2020-05-24 (×3): qty 1

## 2020-05-24 MED ORDER — POLYETHYLENE GLYCOL 3350 17 G PO PACK
17.0000 g | PACK | Freq: Every day | ORAL | Status: DC | PRN
  Administered 2020-05-25: 17 g via ORAL
  Filled 2020-05-24: qty 1

## 2020-05-24 MED ORDER — APIXABAN 5 MG PO TABS
5.0000 mg | ORAL_TABLET | Freq: Two times a day (BID) | ORAL | Status: DC
  Administered 2020-05-24 – 2020-05-25 (×3): 5 mg via ORAL
  Filled 2020-05-24 (×3): qty 1

## 2020-05-24 MED ORDER — ALBUTEROL SULFATE (2.5 MG/3ML) 0.083% IN NEBU: 2.5000 mg | INHALATION_SOLUTION | Freq: Three times a day (TID) | RESPIRATORY_TRACT | Status: DC | PRN

## 2020-05-24 MED ORDER — MELATONIN 3 MG PO TABS
9.0000 mg | ORAL_TABLET | Freq: Every day | ORAL | Status: DC
  Administered 2020-05-24: 9 mg via ORAL
  Filled 2020-05-24: qty 3

## 2020-05-24 MED ORDER — METHYLPREDNISOLONE SODIUM SUCC 40 MG IJ SOLR
40.0000 mg | Freq: Two times a day (BID) | INTRAMUSCULAR | Status: DC
  Administered 2020-05-24 – 2020-05-25 (×3): 40 mg via INTRAVENOUS
  Filled 2020-05-24 (×3): qty 1

## 2020-05-24 MED ORDER — INFLUENZA VAC A&B SA ADJ QUAD 0.5 ML IM PRSY
0.5000 mL | PREFILLED_SYRINGE | INTRAMUSCULAR | Status: AC
  Administered 2020-05-25: 0.5 mL via INTRAMUSCULAR
  Filled 2020-05-24 (×2): qty 0.5

## 2020-05-24 MED ORDER — LISINOPRIL 10 MG PO TABS: 10.0000 mg | ORAL_TABLET | Freq: Every day | ORAL | Status: DC

## 2020-05-24 MED ORDER — IPRATROPIUM-ALBUTEROL 0.5-2.5 (3) MG/3ML IN SOLN
3.0000 mL | Freq: Four times a day (QID) | RESPIRATORY_TRACT | Status: DC
  Administered 2020-05-24 – 2020-05-25 (×4): 3 mL via RESPIRATORY_TRACT
  Filled 2020-05-24 (×4): qty 3

## 2020-05-24 MED ORDER — DM-GUAIFENESIN ER 30-600 MG PO TB12
1.0000 | ORAL_TABLET | Freq: Two times a day (BID) | ORAL | Status: DC
  Administered 2020-05-24 – 2020-05-25 (×3): 1 via ORAL
  Filled 2020-05-24 (×3): qty 1

## 2020-05-24 MED ORDER — LORATADINE 10 MG PO TABS
5.0000 mg | ORAL_TABLET | ORAL | Status: DC
  Administered 2020-05-24: 5 mg via ORAL
  Filled 2020-05-24: qty 1

## 2020-05-24 NOTE — Progress Notes (Addendum)
Patient admitted to the hospital earlier this morning by Dr. Clearence Ped  Patient seen and examined.  She reports that she is coughing.  Overall her respiratory status is mildly better although not back to baseline yet.  She complains of pain in her left foot.  This occurred after her left lower extremity was wrapped overnight.  Currently, patient has bilateral wheezes and rhonchi.  Left lower extremity dressing removed, no erythema noted in lower extremity.  Swelling is comparable between right and left.  Assessment/plan:  Acute on chronic respiratory failure with hypoxia. -Chronically uses oxygen at 2 L at night -She required up to 5 L oxygen on admission -Chest x-ray indicates chronic bilateral changes as well as concern for possible pneumonia. -BNP is also elevated. -Wean down oxygen as tolerated  Altered mental status. -Possibly related to hypoxia -Overall mental status appears to be improved -Urinalysis shows 21-50 WBCs -PCO2 normal on ABG  Generalized weakness -Physical therapy evaluation  Community-acquired pneumonia -Patient had mild fever leukocytosis on admission -Started on ceftriaxone and azithromycin -Chest x-ray shows chronic interstitial changes. -Continue on bronchodilators as well as add some steroids  Elevated BNP -Clinically does not have evidence of volume overload -Check echocardiogram  CKD stage 3b -creatinine is currently at baseline -continue to monitor  [employer]

## 2020-05-24 NOTE — ED Notes (Signed)
Pt provided meal tray per MD request.

## 2020-05-24 NOTE — ED Notes (Signed)
AC notified of nurse needing [employer] supplies.

## 2020-05-24 NOTE — Progress Notes (Signed)
1424 Patient eating at this time, treatment declined at this time.

## 2020-05-24 NOTE — Progress Notes (Signed)
Patient eating dinner Blood gas on hold

## 2020-05-24 NOTE — ED Notes (Signed)
Patient called asking for a gown so she could go to sleep. Patient was unsure of time. Told patient it was 5 am.  Patient has pure wick, but bed and clothing was soiled with urine. Patient clothing and bed lined changed. Patient given warm blankets and lights turned off. Patient has call bell within reach.

## 2020-05-24 NOTE — Progress Notes (Signed)
*  PRELIMINARY RESULTS* Echocardiogram 2D Echocardiogram has been performed.  Kristin [name] 05/24/2020, 12:56 PM

## 2020-05-24 NOTE — H&P (Signed)
TRH H&P    Patient Demographics:    Kristin [name], is a 86 y.o. female  MRN: [id number]  DOB - 08/23/1930  Admit Date - 05/23/2020  Referring MD/NP/PA: Reather Converse  Outpatient Primary MD for the patient is Olin Hauser, DO  Patient coming from: Home  Chief complaint-weakness   HPI:    Kristin [name]  is a 86 y.o. female, with history of recurrent UTIs, overactive bladder, hypertension, hyperlipidemia, hepatitis C, gout, COPD, CHF, CKD, atrial fibrillation, and more presents to the ED with a chief complaint of generalized weakness.  Patient is a poor historian, but granddaughter is very helpful.  Patient reports that she has felt weak for 2 days.  She describes it as her legs feeling weak, and they are also kind of painful.  But when she describes the pain she says it is like a heaviness.  Granddaughter reports that patient is completely oriented and takes care of herself.  She even drove herself to get her hair done 2 days ago.  Patient reports that she has has not felt right though in the past 2 days.  During this time she is also been short of breath.  Been getting worse over 2 days.  She does deny chest pain and palpitations.  Granddaughter notes that patient has not been eating and drinking in the past day and a half.  She has had decreased urine output and the urine output has been dark.  Patient denies dysuria.  Patient does report that she has low back pain, its in the center of her back.  Is the same as her normal arthritis pain in her back.  Patient admits to having fever at home.  She has not taken any medication for it.  She denies any diaphoresis.  Dry cough as well.  Was falling asleep during conversation and does not add more history.  Of note patient has O2 that she supposed to wear at night at home that is 2 L nasal cannula. She uses albuterol nebulizer as needed for COPD. Patient has CHF and has been  taking her Lasix 20 mg.  In the ED Temperature 98.6, heart rate 65, respiratory rate 13, blood pressure 119/99, 94% on 5 L nasal cannula White blood cell count 11.2, hemoglobin 12.3 CHEM panel shows a BUN of 28, creatinine of 1.57 -baseline creatinine seems to be 1.2-1.5 Negative flu and Covid Downtrending Trope from 36-34 BNP is 994 Chest x-ray shows interstitial lung markings with mild superimposed bibasilar atelectasis and/or infiltrate EKG shows a heart rate of 76, A. fib, QTC of 467 UA pending    Review of systems:    In addition to the HPI above,  Review of Systems  Constitutional: Positive for fever and malaise/fatigue. Negative for chills, diaphoresis and weight loss.  HENT: Positive for congestion. Negative for sinus pain and sore throat.   Eyes: Negative for blurred vision and double vision.  Respiratory: Positive for cough and shortness of breath. Negative for sputum production and wheezing.   Cardiovascular: Negative for chest pain, palpitations and leg  swelling.  Gastrointestinal: Negative for abdominal pain, diarrhea, nausea and vomiting.  Genitourinary: Negative for dysuria, frequency and urgency.       Decreased urine output  Musculoskeletal: Positive for back pain (chronic). Negative for myalgias.  Skin: Negative for itching and rash.  Neurological: Positive for weakness. Negative for dizziness, focal weakness and headaches.  Endo/Heme/Allergies: Negative for polydipsia.  Psychiatric/Behavioral: Negative for substance abuse.   .  All other systems reviewed and are negative.    Past History of the following :    Past Medical History:  Diagnosis Date  . Acute postoperative pain 02/09/2018  . Arrhythmia, sinus node 04/03/2014  . Arthritis   . Arthritis   . Atrial fibrillation ([place])   . Back pain    lower back chronic  . Bradycardia   . Breast cancer ([place]) 2004   right breast cancer  . Cancer [facility] 2004   rt breast cancer-post lumpectomy- chemo/rad  .  CHF (congestive heart failure) ([place])   . Chronic back pain   . CKD (chronic kidney disease)   . COPD (chronic obstructive pulmonary disease) ([facility])    wears O2 at 2L via [place] at night  . Cystocele   . Decubitus ulcers   . Dehydration   . Gout   . Hematuria   . Hepatitis C   . Hyperlipidemia   . Hypertension   . Hypomagnesemia 06/25/2015  . OAB (overactive bladder)   . Overactive bladder   . Restless leg   . Shoulder pain, bilateral   . Urinary frequency   . UTI (lower urinary tract infection)   . Vaginal atrophy       Past Surgical History:  Procedure Laterality Date  . ABDOMINAL HYSTERECTOMY    . BACK SURGERY    . BREAST LUMPECTOMY Right 2004  . BREAST SURGERY     rt lumpectomy  . CARPAL TUNNEL RELEASE    . CATARACT EXTRACTION W/PHACO  10/19/2011   Procedure: CATARACT EXTRACTION PHACO AND INTRAOCULAR LENS PLACEMENT (IOC);  Surgeon: Elta Guadeloupe T. Gershon Crane, MD;  Location: [facility];  Service: Ophthalmology;  Laterality: Right;  CDE:10.81  . CATARACT EXTRACTION W/PHACO  11/02/2011   Procedure: CATARACT EXTRACTION PHACO AND INTRAOCULAR LENS PLACEMENT (IOC);  Surgeon: Elta Guadeloupe T. Gershon Crane, MD;  Location: [facility];  Service: Ophthalmology;  Laterality: Left;  CDE 8.60  . CHOLECYSTECTOMY     3/18  . COLON SURGERY    . INSERT / REPLACE / REMOVE PACEMAKER    . JOINT REPLACEMENT     bilateral TKA  . PACEMAKER INSERTION N/A 12/24/2015   Procedure: INSERTION PACEMAKER;  Surgeon: Isaias Cowman, MD;  Location: [facility] ORS;  Service: Cardiovascular;  Laterality: N/A;  . ROTATOR CUFF REPAIR     bilateral      Social History:      Social History   Tobacco Use  . Smoking status: Former Smoker    Packs/day: 1.25    Years: 40.00    Pack years: 50.00    Types: Cigarettes    Quit date: 12/16/1998    Years since quitting: 21.4  . Smokeless tobacco: Former Network engineer Use Topics  . Alcohol use: No    Alcohol/week: 0.0 standard drinks       Family History :     Family History  Problem  Relation Age of Onset  . Kidney disease Brother   . Heart disease Father   . Heart disease Mother   . Stroke Mother   . Cancer Sister   .  Breast cancer Sister 81  . Breast cancer Paternal Aunt   . Anesthesia problems Neg Hx   . Hypotension Neg Hx   . Malignant hyperthermia Neg Hx   . Pseudochol deficiency Neg Hx       Home Medications:   Prior to Admission medications   Medication Sig Start Date End Date Taking? Authorizing Provider  acetaminophen (TYLENOL) 500 MG tablet Take 500 mg by mouth 3 (three) times daily.    [provider]  albuterol (PROVENTIL) (2.5 MG/3ML) 0.083% nebulizer solution Take 3 mLs (2.5 mg total) by nebulization every 8 (eight) hours as needed for wheezing or shortness of breath. 10/02/19 10/01/20  Karamalegos, Devonne Doughty, DO  apixaban (ELIQUIS) 5 MG TABS tablet Take 1 tablet (5 mg total) by mouth 2 (two) times daily. 10/30/15   Loletha Grayer, MD  bisacodyl (DULCOLAX) 5 MG EC tablet Take 2 tablets (10 mg total) by mouth daily as needed for moderate constipation. 08/01/19 07/31/20  Milinda Pointer, MD  Calcium Carbonate-Vitamin D 600-200 MG-UNIT TABS Take by mouth.    [provider]  Cholecalciferol (VITAMIN D) 2000 units tablet Take 2,000 Units by mouth daily.     [provider]  furosemide (LASIX) 20 MG tablet TAKE (1/2) TABLET BY MOUTH ONCE DAILY. 05/08/19   Mikey College, NP  gabapentin (NEURONTIN) 100 MG capsule Start 1 capsule daily, increase by 1 cap every 2-3 days as tolerated up to 3 times a day, or may take 3 at once in evening. 04/29/20   Karamalegos, Devonne Doughty, DO  levocetirizine (XYZAL) 5 MG tablet Take 0.5 tablets (2.5 mg total) by mouth every other day. 05/17/19   Mikey College, NP  lisinopril (ZESTRIL) 10 MG tablet Take 1 tablet (10 mg total) by mouth daily. 05/08/19   Mikey College, NP  Melatonin 10 MG CAPS Take 10 mg by mouth at bedtime. 05/28/20 11/24/20  Milinda Pointer, MD  mupirocin ointment  (BACTROBAN) 2 % Apply 1 application topically 2 (two) times daily as needed. For up to 7-10 days as needed to help heal and prevent infection. 04/01/20   Karamalegos, Devonne Doughty, DO  Oxycodone HCl 20 MG TABS Take 1 tablet (20 mg total) by mouth every 8 (eight) hours as needed. Must last 30 days Patient not taking: Reported on 04/01/2020 03/28/20 04/27/20  Milinda Pointer, MD  Oxycodone HCl 20 MG TABS Take 1 tablet (20 mg total) by mouth every 8 (eight) hours as needed. Must last 30 days 04/27/20 05/27/20  Milinda Pointer, MD  Oxycodone HCl 20 MG TABS Take 1 tablet (20 mg total) by mouth every 8 (eight) hours as needed. Must last 30 days 05/27/20 06/26/20  Milinda Pointer, MD  OXYGEN Inhale 2 L into the lungs at bedtime.     [provider]  polyethylene glycol (MIRALAX / GLYCOLAX) 17 g packet Take 17 g by mouth daily as needed for moderate constipation. Must last 30 days 08/01/19 07/31/20  Milinda Pointer, MD  rOPINIRole (REQUIP) 2 MG tablet Take 2 tablets (4 mg total) by mouth at bedtime. Must last 30 days 01/28/20 07/26/20  Milinda Pointer, MD  Wheat Dextrin (BENEFIBER) POWD Take 6 g by mouth 3 (three) times daily before meals. (2 tsp = 6 g) 08/01/19 07/31/20  Milinda Pointer, MD     Allergies:     Allergies  Allergen Reactions  . Flexeril [Cyclobenzaprine Hcl]     Severe Rash  . Vancomycin Rash    [place] Syndrome  Physical Exam:   Vitals  Blood pressure 125/65, pulse 61, temperature 98.6 F (37 C), temperature source Oral, resp. rate 10, height 5\' 7"  (1.702 m), weight 75 kg, SpO2 99 %.  1.  General: Laying supine in bed drowsy  2. Psychiatric: Mood and behavior normal for situation  3. Neurologic: Oriented x3, drowsy falling asleep in the middle of speaking Moves all 4 extremities voluntarily  4. HEENMT:  Head is atraumatic, normocephalic, pupils are reactive to light, neck is supple, trachea is midline, mucous membranes are mildly dry  5. Respiratory  : Rhonchi in both lungs No wheezing Continue to monitor  6. Cardiovascular : Heart rate and heart rate is normal Rhythm is irregularly irregular No murmur, rub or gallop   7. Gastrointestinal:  Abdomen is soft, nondistended, nontender to palpation  8. Skin:  No acute lesion on limited skin exam Left ankle wrapped  9.Musculoskeletal:  Trace edema in the lower extremities    Data Review:    CBC Recent Labs  Lab 05/23/20 2021  WBC 11.2*  HGB 12.3  HCT 39.5  PLT 157  MCV 99.2  MCH 30.9  MCHC 31.1  RDW 13.7   ------------------------------------------------------------------------------------------------------------------  Results for orders placed or performed during the hospital encounter of 05/23/20 (from the past 48 hour(s))  Respiratory Panel by RT PCR (Flu A&B, Covid) - Nasopharyngeal Swab     Status: None   Collection Time: 05/23/20  7:58 PM   Specimen: Nasopharyngeal Swab  Result Value Ref Range   SARS Coronavirus 2 by RT PCR NEGATIVE NEGATIVE    Comment: (NOTE) SARS-CoV-2 target nucleic acids are NOT DETECTED.  The SARS-CoV-2 RNA is generally detectable in upper respiratoy specimens during the acute phase of infection. The lowest concentration of SARS-CoV-2 viral copies this assay can detect is 131 copies/mL. A negative result does not preclude SARS-Cov-2 infection and should not be used as the sole basis for treatment or other patient management decisions. A negative result may occur with  improper specimen collection/handling, submission of specimen other than nasopharyngeal swab, presence of viral mutation(s) within the areas targeted by this assay, and inadequate number of viral copies (<131 copies/mL). A negative result must be combined with clinical observations, patient history, and epidemiological information. The expected result is Negative.  Fact Sheet for Patients:  PinkCheek.be  Fact Sheet for Healthcare  Providers:  GravelBags.it  This test is no t yet approved or cleared by the [place] FDA and  has been authorized for detection and/or diagnosis of SARS-CoV-2 by FDA under an Emergency Use Authorization (EUA). This EUA will remain  in effect (meaning this test can be used) for the duration of the COVID-19 declaration under Section 564(b)(1) of the Act, 21 U.S.C. section 360bbb-3(b)(1), unless the authorization is terminated or revoked sooner.     Influenza A by PCR NEGATIVE NEGATIVE   Influenza B by PCR NEGATIVE NEGATIVE    Comment: (NOTE) The Xpert Xpress SARS-CoV-2/FLU/RSV assay is intended as an aid in  the diagnosis of influenza from Nasopharyngeal swab specimens and  should not be used as a sole basis for treatment. Nasal washings and  aspirates are unacceptable for Xpert Xpress SARS-CoV-2/FLU/RSV  testing.  Fact Sheet for Patients: PinkCheek.be  Fact Sheet for Healthcare Providers: GravelBags.it  This test is not yet approved or cleared by the [place] FDA and  has been authorized for detection and/or diagnosis of SARS-CoV-2 by  FDA under an Emergency Use Authorization (EUA). This EUA will remain  in effect (  meaning this test can be used) for the duration of the  Covid-19 declaration under Section 564(b)(1) of the Act, 21  U.S.C. section 360bbb-3(b)(1), unless the authorization is  terminated or revoked. Performed at [facility], [address] [place], [place] [address]   CBG monitoring, ED     Status: None   Collection Time: 05/23/20  8:20 PM  Result Value Ref Range   Glucose-Capillary 90 70 - 99 mg/dL    Comment: Glucose reference range applies only to samples taken after fasting for at least 8 hours.  Basic metabolic panel     Status: Abnormal   Collection Time: 05/23/20  8:21 PM  Result Value Ref Range   Sodium 135 135 - 145 mmol/L   Potassium 3.8 3.5 - 5.1 mmol/L    Chloride 103 98 - 111 mmol/L   CO2 21 (L) 22 - 32 mmol/L   Glucose, Bld 106 (H) 70 - 99 mg/dL    Comment: Glucose reference range applies only to samples taken after fasting for at least 8 hours.   BUN 28 (H) 8 - 23 mg/dL   Creatinine, Ser 1.57 (H) 0.44 - 1.00 mg/dL   Calcium 9.2 8.9 - 10.3 mg/dL   GFR calc non Af Amer 29 (L) >60 mL/min   GFR calc Af Amer 34 (L) >60 mL/min   Anion gap 11 5 - 15    Comment: Performed at [facility], [address] [place], [place] [address]  CBC     Status: Abnormal   Collection Time: 05/23/20  8:21 PM  Result Value Ref Range   WBC 11.2 (H) 4.0 - 10.5 K/uL   RBC 3.98 3.87 - 5.11 MIL/uL   Hemoglobin 12.3 12.0 - 15.0 g/dL   HCT 39.5 36 - 46 %   MCV 99.2 80.0 - 100.0 fL   MCH 30.9 26.0 - 34.0 pg   MCHC 31.1 30.0 - 36.0 g/dL   RDW 13.7 11.5 - 15.5 %   Platelets 157 150 - 400 K/uL   nRBC 0.0 0.0 - 0.2 %    Comment: Performed at [facility], [address] [place], [place] [address]  Brain natriuretic peptide     Status: Abnormal   Collection Time: 05/23/20  8:21 PM  Result Value Ref Range   B Natriuretic Peptide 994.0 (H) 0.0 - 100.0 pg/mL    Comment: Performed at [facility], [address] [place], [place] [address]  Troponin I (High Sensitivity)     Status: Abnormal   Collection Time: 05/23/20  8:21 PM  Result Value Ref Range   Troponin I (High Sensitivity) 36 (H) <18 ng/L    Comment: (NOTE) Elevated high sensitivity troponin I (hsTnI) values and significant  changes across serial measurements may suggest ACS but many other  chronic and acute conditions are known to elevate hsTnI results.  Refer to the "Links" section for chest pain algorithms and additional  guidance. Performed at [facility], [address], [place], [place] [address]   Troponin I (High Sensitivity)     Status: Abnormal   Collection Time: 05/24/20 12:06 AM  Result Value Ref Range   Troponin I (High Sensitivity) 34 (H) <18 ng/L    Comment: (NOTE) Elevated high  sensitivity troponin I (hsTnI) values and significant  changes across serial measurements may suggest ACS but many other  chronic and acute conditions are known to elevate hsTnI results.  Refer to the "Links" section for chest pain algorithms and additional  guidance. Performed at  [facility], [address], [place], [place] [address]   Urinalysis, Routine w reflex microscopic Urine, Clean Catch     Status: Abnormal   Collection Time: 05/24/20  1:15 AM  Result Value Ref Range   Color, Urine YELLOW YELLOW   APPearance HAZY (A) CLEAR   Specific Gravity, Urine 1.017 1.005 - 1.030   pH 6.0 5.0 - 8.0   Glucose, UA NEGATIVE NEGATIVE mg/dL   Hgb urine dipstick SMALL (A) NEGATIVE   Bilirubin Urine NEGATIVE NEGATIVE   Ketones, ur NEGATIVE NEGATIVE mg/dL   Protein, ur 30 (A) NEGATIVE mg/dL   Nitrite NEGATIVE NEGATIVE   Leukocytes,Ua MODERATE (A) NEGATIVE   RBC / HPF 0-5 0 - 5 RBC/hpf   WBC, UA 21-50 0 - 5 WBC/hpf   Bacteria, UA RARE (A) NONE SEEN   Squamous Epithelial / LPF 11-20 0 - 5    Comment: Performed at [facility], [address], [place], [place] [address]    Chemistries  Recent Labs  Lab 05/23/20 2021  NA 135  K 3.8  CL 103  CO2 21*  GLUCOSE 106*  BUN 28*  CREATININE 1.57*  CALCIUM 9.2   ------------------------------------------------------------------------------------------------------------------  ------------------------------------------------------------------------------------------------------------------ GFR: Estimated Creatinine Clearance: 26.2 mL/min (A) (by C-G formula based on SCr of 1.57 mg/dL (H)). Liver Function Tests: No results for input(s): AST, ALT, ALKPHOS, BILITOT, PROT, ALBUMIN in the last 168 hours. No results for input(s): LIPASE, AMYLASE in the last 168 hours. No results for input(s): AMMONIA in the last 168 hours. Coagulation Profile: No results for input(s): INR, PROTIME in the last 168 hours. Cardiac Enzymes: No results for input(s):  CKTOTAL, CKMB, CKMBINDEX, TROPONINI in the last 168 hours. BNP (last 3 results) No results for input(s): PROBNP in the last 8760 hours. HbA1C: No results for input(s): HGBA1C in the last 72 hours. CBG: Recent Labs  Lab 05/23/20 2020  GLUCAP 90   Lipid Profile: No results for input(s): CHOL, HDL, LDLCALC, TRIG, CHOLHDL, LDLDIRECT in the last 72 hours. Thyroid Function Tests: No results for input(s): TSH, T4TOTAL, FREET4, T3FREE, THYROIDAB in the last 72 hours. Anemia Panel: No results for input(s): VITAMINB12, FOLATE, FERRITIN, TIBC, IRON, RETICCTPCT in the last 72 hours.  --------------------------------------------------------------------------------------------------------------- Urine analysis:    Component Value Date/Time   COLORURINE YELLOW 05/24/2020 0115   APPEARANCEUR HAZY (A) 05/24/2020 0115   APPEARANCEUR Clear 09/03/2015 1036   LABSPEC 1.017 05/24/2020 0115   PHURINE 6.0 05/24/2020 0115   GLUCOSEU NEGATIVE 05/24/2020 0115   HGBUR SMALL (A) 05/24/2020 0115   BILIRUBINUR NEGATIVE 05/24/2020 0115   BILIRUBINUR neg 11/08/2016 1519   BILIRUBINUR Negative 09/03/2015 1036   KETONESUR NEGATIVE 05/24/2020 0115   PROTEINUR 30 (A) 05/24/2020 0115   UROBILINOGEN 0.2 11/08/2016 1519   UROBILINOGEN 0.2 02/17/2011 1050   NITRITE NEGATIVE 05/24/2020 0115   LEUKOCYTESUR MODERATE (A) 05/24/2020 0115      Imaging Results:    DG Chest Portable 1 View  Result Date: 05/23/2020 CLINICAL DATA:  Weakness and shortness of breath. EXAM: PORTABLE CHEST 1 VIEW COMPARISON:  December 06, 2017 FINDINGS: There is a dual lead AICD. Stable, mild to moderate severity chronic appearing diffusely increased interstitial lung markings are noted. Mild areas of superimposed atelectasis and/or infiltrate are seen within the bilateral lung bases. There is no evidence of a pleural effusion or pneumothorax. The cardiac silhouette is moderately enlarged. There is moderate severity calcification of the aortic  arch. Bilateral shoulder replacements are seen. IMPRESSION: 1. Stable, mild to moderate severity chronic appearing  increased interstitial lung markings with mild superimposed bibasilar atelectasis and/or infiltrate. 2. Stable cardiomegaly. 3. Aortic atherosclerosis. Electronically Signed   By: Virgina Norfolk M.D.   On: 05/23/2020 21:09    My personal review of EKG: Rhythm NSR, Rate 76 /min, QTc 467,no Acute ST changes   Assessment & Plan:    Active Problems:   Respiratory failure with hypoxia ([place])   1. Acute on chronic respiratory failure with hypoxia 1. Patient satting in high 70s on arrival 2. Stabilized on 5 L nasal cannula 3. Has a history of CHF and COPD 4. ABG pending to assess for possible CO2 retention 5. BNP is 994, no other BNP in our system to compare to 6. Holding on Lasix at this time as kidney acquired pneumonia is higher on the differential for the etiology of this hypoxia 7. Will monitor intake and output, if patient clinically becomes fluid overloaded, will add Lasix at that time 8. Continue to monitor 2. Altered mental status 1. Described as confused, when patient is usually totally oriented 2. Most likely secondary to hypoxia with O2 sats at high 70s on arrival 3. ABG pending 4. CO2 retention could be contributing with patient history of COPD -again ABG pending 5. TSH also pending 6. Daughter reports that patient has been like this before its been a UTI -UA pending 3. Generalized weakness 1. Secondary to hypoxia 2. Continue treatment plan above and reassess 3. Patient remains weak consider consulting PT 4. Community acquired pneumonia 1. Fever of 100.6, leukocytosis 11.2, chest x-ray shows possible infiltrate 2. Treat with Rocephin and Zithromax 3. Sputum culture pending 4. Albuterol as needed 5. Continue to monitor 5. Elevated troponin 1. Downtrending from 36-34 2. Monitor on telemetry 3. Most likely demand ischemia, also in the setting of  CKD 6. Elevated BNP 1. BNP 994 2. Holding Lasix at this time 3. Patient has history of CHF, but no BNP to compare to 4. If clinically becoming overloaded consider CHF treatment plan   DVT Prophylaxis-Eliquis- SCDs   AM Labs Ordered, also please review Full Orders  Family Communication: Admission, patients condition and plan of care including tests being ordered have been discussed with the patient and granddaughter [name] who indicate understanding and agree with the plan and Code Status.  Code Status: Full  Admission status: Inpatient :The appropriate admission status for this patient is INPATIENT. Inpatient status is judged to be reasonable and necessary in order to provide the required intensity of service to ensure the patient's safety. The patient's presenting symptoms, physical exam findings, and initial radiographic and laboratory data in the context of their chronic comorbidities is felt to place them at high risk for further clinical deterioration. Furthermore, it is not anticipated that the patient will be medically stable for discharge from the hospital within 2 midnights of admission. The following factors support the admission status of inpatient.     The patient's presenting symptoms include generalized weakness The worrisome physical exam findings include confusion, hypoxia The initial radiographic and laboratory data are worrisome because of chest x-ray shows interstitial lung markings with mild superimposed bibasilar atelectasis and/or infiltrate, leukocytosis at 11.2, mildly elevated troponin at 36 The chronic co-morbidities include COPD, A. fib, chronic respiratory failure at night with 2 L nasal cannula at night only, restless leg, overactive bladder, hypertension hyperlipidemia, hepatitis C, CHF, and more       * I certify that at the point of admission it is my clinical judgment that the patient will require inpatient hospital  care spanning beyond 2 midnights from the  point of admission due to high intensity of service, high risk for further deterioration and high frequency of surveillance required.*  Time spent in minutes : [place]

## 2020-05-25 ENCOUNTER — Inpatient Hospital Stay (HOSPITAL_COMMUNITY): Payer: Medicare HMO

## 2020-05-25 DIAGNOSIS — J189 Pneumonia, unspecified organism: Principal | ICD-10-CM

## 2020-05-25 DIAGNOSIS — J441 Chronic obstructive pulmonary disease with (acute) exacerbation: Secondary | ICD-10-CM

## 2020-05-25 DIAGNOSIS — I5032 Chronic diastolic (congestive) heart failure: Secondary | ICD-10-CM

## 2020-05-25 MED ORDER — DM-GUAIFENESIN ER 30-600 MG PO TB12: 1.0000 | ORAL_TABLET | Freq: Two times a day (BID) | ORAL | 0 refills | Status: DC

## 2020-05-25 MED ORDER — AMOXICILLIN-POT CLAVULANATE 875-125 MG PO TABS: 1.0000 | ORAL_TABLET | Freq: Two times a day (BID) | ORAL | 0 refills | Status: AC

## 2020-05-25 MED ORDER — AZITHROMYCIN 500 MG PO TABS: 500.0000 mg | ORAL_TABLET | Freq: Every day | ORAL | 0 refills | Status: AC

## 2020-05-25 MED ORDER — PREDNISONE 10 MG PO TABS: ORAL_TABLET | ORAL | 0 refills | Status: DC

## 2020-05-25 NOTE — TOC Transition Note (Signed)
Transition of Care [facility]) - CM/SW Discharge Note   Patient Details  Name: Kristin [name] MRN: [id number] Date of Birth: December 14, 1929  Transition of Care [facility]) CM/SW Contact:  Natasha Bence, LCSW Phone Number: 05/25/2020, 12:35 PM   Clinical Narrative:    CSW received request for [facility] services and O2. CSW placed referral for [facility] with [facility]. Patient already utilizes [place] for home O2. CSW placed referral with [facility] for portable to be delivered to the hospital. TOC signing off.   Final next level of care: [place] Barriers to Discharge: Barriers Resolved   Patient Goals and CMS Choice Patient states their goals for this hospitalization and ongoing recovery are:: Return home with [facility]   Choice offered to / list presented to : Patient  Discharge Placement                    Patient and family notified of of transfer: 05/25/20  Discharge Plan and Services                DME Arranged: Oxygen DME Agency: [employer] [facility] Date DME Agency Contacted: 05/25/20 Time DME Agency Contacted: [id number] Representative spoke with at DME Agency: [facility] [place] Arranged: PT [place] Agency: [place] Date [place]: 05/25/20 Time [place]: [id number] Representative spoke with at [place]: [place] ([place]) Interventions     Readmission Risk Interventions Readmission Risk Prevention Plan 05/25/2020  Transportation Screening Complete  PCP or Specialist Appt within 3-5 Days Not Complete  Not Complete comments Sunday discharge PCP not available  HRI or [place] Complete  Social Work Consult for [place] Planning/Counseling Complete  Palliative Care Screening Not Applicable  Medication Review Press photographer) Complete  Some recent data might be hidden

## 2020-05-25 NOTE — Discharge Summary (Signed)
Physician Discharge Summary  Kristin [name] BJS:[id number] DOB: August 11, 1930 DOA: 05/23/2020  PCP: Olin Hauser, DO  Admit date: 05/23/2020 Discharge date: 05/25/2020  Admitted From: home Disposition:  home  Recommendations for Outpatient Follow-up:  1. Follow up with PCP in 1-2 weeks 2. Please obtain BMP/CBC in one week 3. Please follow up on the following pending results:  Home Health: home health PT Equipment/Devices:oxygen at 2L  Discharge Condition:stable CODE STATUS:full code Diet recommendation: heart healthy  Brief/Interim Summary:  Kristin [name]  is a 86 y.o. female, with history of recurrent UTIs, overactive bladder, hypertension, hyperlipidemia, hepatitis C, gout, COPD, CHF, CKD, atrial fibrillation, and more presents to the ED with a chief complaint of generalized weakness.  Patient is a poor historian, but granddaughter is very helpful.  Patient reports that she has felt weak for 2 days.  She describes it as her legs feeling weak, and they are also kind of painful.  But when she describes the pain she says it is like a heaviness.  Granddaughter reports that patient is completely oriented and takes care of herself.  She even drove herself to get her hair done 2 days ago.  Patient reports that she has has not felt right though in the past 2 days.  During this time she is also been short of breath.  Been getting worse over 2 days.  She does deny chest pain and palpitations.  Granddaughter notes that patient has not been eating and drinking in the past day and a half.  She has had decreased urine output and the urine output has been dark.  Patient denies dysuria.  Patient does report that she has low back pain, its in the center of her back.  Is the same as her normal arthritis pain in her back.  Patient admits to having fever at home.  She has not taken any medication for it.  She denies any diaphoresis.  Dry cough as well.  Was falling asleep during conversation and does not add  more history.  Of note patient has O2 that she supposed to wear at night at home that is 2 L nasal cannula. She uses albuterol nebulizer as needed for COPD. Patient has CHF and has been taking her Lasix 20 mg.   Discharge Diagnoses:  Active Problems:   Respiratory failure with hypoxia (HCC)  Acute on chronic respiratory failure with hypoxia. -Chronically uses oxygen at 2 L at night -She required up to 5 L oxygen on admission, but has since been weaned down to 2L -Chest x-ray indicates chronic bilateral changes as well as concern for possible pneumonia.   Altered mental status. -Possibly related to hypoxia -Overall mental status appears to be improved -Urinalysis shows 21-50 WBCs, but no dysuria -PCO2 normal on ABG  Generalized weakness -Physical therapy evaluation recommended home health PT  Community-acquired pneumonia in the setting of copd -Patient had mild fever, leukocytosis on admission -Started on ceftriaxone and azithromycin -Chest x-ray shows chronic interstitial changes. -she was also treated with steroids and bronchodilators -with clinical improvement, she was transitioned to oral antibiotics and prednisone taper -COVID19 negative  Elevated BNP/Chronic diastolic CHF -Clinically does not have evidence of volume overload -Echo showed preserved EF with grade 1 diastolic dysfunction -Resume home dose of lasix at discharge  CKD stage 3b -creatinine is currently at baseline -continue to monitor  Discharge Instructions  Discharge Instructions    Diet - low sodium heart healthy   Complete by: As directed    Increase activity  slowly   Complete by: As directed      Allergies as of 05/25/2020      Reactions   Flexeril [cyclobenzaprine Hcl]    Severe Rash   Vancomycin Rash   Red Mans Syndrome      Medication List    STOP taking these medications   lisinopril 10 MG tablet Commonly known as: ZESTRIL     TAKE these medications   acetaminophen 500  MG tablet Commonly known as: TYLENOL Take 500 mg by mouth 3 (three) times daily.   albuterol (2.5 MG/3ML) 0.083% nebulizer solution Commonly known as: PROVENTIL Take 3 mLs (2.5 mg total) by nebulization every 8 (eight) hours as needed for wheezing or shortness of breath.   amoxicillin-clavulanate 875-125 MG tablet Commonly known as: Augmentin Take 1 tablet by mouth 2 (two) times daily for 5 days.   apixaban 5 MG Tabs tablet Commonly known as: ELIQUIS Take 1 tablet (5 mg total) by mouth 2 (two) times daily.   azithromycin 500 MG tablet Commonly known as: Zithromax Take 1 tablet (500 mg total) by mouth daily for 3 days. Take 1 tablet daily for 3 days.   Benefiber Powd Take 6 g by mouth 3 (three) times daily before meals. (2 tsp = 6 g)   bisacodyl 5 MG EC tablet Commonly known as: DULCOLAX Take 2 tablets (10 mg total) by mouth daily as needed for moderate constipation.   Calcium Carbonate-Vitamin D 600-200 MG-UNIT Tabs Take by mouth.   dextromethorphan-guaiFENesin 30-600 MG 12hr tablet Commonly known as: MUCINEX DM Take 1 tablet by mouth 2 (two) times daily.   furosemide 20 MG tablet Commonly known as: LASIX TAKE (1/2) TABLET BY MOUTH ONCE DAILY.   gabapentin 100 MG capsule Commonly known as: NEURONTIN Start 1 capsule daily, increase by 1 cap every 2-3 days as tolerated up to 3 times a day, or may take 3 at once in evening.   levocetirizine 5 MG tablet Commonly known as: XYZAL Take 0.5 tablets (2.5 mg total) by mouth every other day.   Melatonin 10 MG Caps Take 10 mg by mouth at bedtime. Start taking on: May 28, 2020   mupirocin ointment 2 % Commonly known as: Bactroban Apply 1 application topically 2 (two) times daily as needed. For up to 7-10 days as needed to help heal and prevent infection.   Oxycodone HCl 20 MG Tabs Take 1 tablet (20 mg total) by mouth every 8 (eight) hours as needed. Must last 30 days   Oxycodone HCl 20 MG Tabs Take 1 tablet (20 mg  total) by mouth every 8 (eight) hours as needed. Must last 30 days   Oxycodone HCl 20 MG Tabs Take 1 tablet (20 mg total) by mouth every 8 (eight) hours as needed. Must last 30 days Start taking on: May 27, 2020   OXYGEN Inhale 2 L into the lungs at bedtime.   polyethylene glycol 17 g packet Commonly known as: MIRALAX / GLYCOLAX Take 17 g by mouth daily as needed for moderate constipation. Must last 30 days   predniSONE 10 MG tablet Commonly known as: DELTASONE Take 40mg  po daily for 2 days then 30mg  daily for 2 days then 20mg  daily for 2 days then 10mg  daily for 2 days then stop   rOPINIRole 2 MG tablet Commonly known as: Requip Take 2 tablets (4 mg total) by mouth at bedtime. Must last 30 days   Vitamin D 50 MCG (2000 UT) tablet Take 2,000 Units by mouth daily.  Follow-up Information    [facility] Follow up.   Specialty: Home Health Services Why: HHPT Contact information: [place] [address] [phone]        [facility] Follow up.   Why: 2L O2 Contact information: [place] [address] [phone]              Allergies  Allergen Reactions  . Flexeril [Cyclobenzaprine Hcl]     Severe Rash  . Vancomycin Rash    Red Mans Syndrome    Consultations:     Procedures/Studies: DG Chest Port 1 View  Result Date: 05/25/2020 CLINICAL DATA:  Cough, wheezing EXAM: PORTABLE CHEST 1 VIEW COMPARISON:  05/23/2020 chest radiograph. FINDINGS: Stable configuration of 2 lead left subclavian pacemaker. Surgical clips overlie the right axilla and upper right abdomen. Partially visualized bilateral shoulder arthroplasty. Stable cardiomediastinal silhouette with mild cardiomegaly. No pneumothorax. No pleural effusion. Scattered reticular and parahilar interstitial opacities throughout both lungs appear unchanged. No superimposed acute consolidative airspace disease. IMPRESSION: 1. Stable mild cardiomegaly.  2. Stable scattered reticular and parahilar interstitial opacities throughout both lungs, favor nonspecific scarring with a component of mild pulmonary edema not excluded. Electronically Signed   By: Ilona Sorrel M.D.   On: 05/25/2020 05:36   DG Chest Portable 1 View  Result Date: 05/23/2020 CLINICAL DATA:  Weakness and shortness of breath. EXAM: PORTABLE CHEST 1 VIEW COMPARISON:  December 06, 2017 FINDINGS: There is a dual lead AICD. Stable, mild to moderate severity chronic appearing diffusely increased interstitial lung markings are noted. Mild areas of superimposed atelectasis and/or infiltrate are seen within the bilateral lung bases. There is no evidence of a pleural effusion or pneumothorax. The cardiac silhouette is moderately enlarged. There is moderate severity calcification of the aortic arch. Bilateral shoulder replacements are seen. IMPRESSION: 1. Stable, mild to moderate severity chronic appearing increased interstitial lung markings with mild superimposed bibasilar atelectasis and/or infiltrate. 2. Stable cardiomegaly. 3. Aortic atherosclerosis. Electronically Signed   By: Virgina Norfolk M.D.   On: 05/23/2020 21:09   ECHOCARDIOGRAM COMPLETE  Result Date: 05/24/2020    ECHOCARDIOGRAM REPORT   Patient Name:   NOHELI MELDER Date of Exam: 05/24/2020 Medical Rec #:  [id number]     Height:       67.0 in Accession #:    [id number]    Weight:       165.3 lb Date of Birth:  September 15, 1929      BSA:          1.865 m Patient Age:    53 years      BP:           106/53 mmHg Patient Gender: F             HR:           60 bpm. Exam Location:  [facility] Procedure: 2D Echo, Cardiac Doppler and Color Doppler Indications:    Elevated Troponin  History:        Patient has prior history of Echocardiogram examinations, most                 recent 10/29/2015. CHF, Pacemaker, COPD, Arrythmias:Atrial                 Fibrillation; Risk Factors:Dyslipidemia and Hypertension.                 Anticoagulated, GERD, History of  breast cancer, History of  methicillin resistant staphylococcus aureus (MRSA),.  Sonographer:    Alvino Chapel RCS Referring Phys: [place]  1. Left ventricular ejection fraction, by estimation, is 60 to 65%. The left ventricle has normal function. The left ventricle has no regional wall motion abnormalities. There is mild left ventricular hypertrophy. Left ventricular diastolic parameters are consistent with Grade I diastolic dysfunction (impaired relaxation).  2. Right ventricular systolic function is normal. The right ventricular size is normal. There is normal pulmonary artery systolic pressure.  3. Left atrial size was moderately dilated.  4. Right atrial size was mild to moderately dilated.  5. The mitral valve is normal in structure. Trivial mitral valve regurgitation. No evidence of mitral stenosis.  6. The aortic valve is tricuspid. There is moderate calcification of the aortic valve. There is moderate thickening of the aortic valve. Aortic valve regurgitation is not visualized. No aortic stenosis is present.  7. The inferior vena cava is normal in size with greater than 50% respiratory variability, suggesting right atrial pressure of 3 mmHg. FINDINGS  Left Ventricle: Left ventricular ejection fraction, by estimation, is 60 to 65%. The left ventricle has normal function. The left ventricle has no regional wall motion abnormalities. The left ventricular internal cavity size was normal in size. There is  mild left ventricular hypertrophy. Left ventricular diastolic parameters are consistent with Grade I diastolic dysfunction (impaired relaxation). Normal left ventricular filling pressure. Right Ventricle: The right ventricular size is normal. No increase in right ventricular wall thickness. Right ventricular systolic function is normal. There is normal pulmonary artery systolic pressure. The tricuspid regurgitant velocity is 2.74 m/s, and  with an assumed right atrial  pressure of 3 mmHg, the estimated right ventricular systolic pressure is 16.1 mmHg. Left Atrium: Left atrial size was moderately dilated. Right Atrium: Right atrial size was mild to moderately dilated. Pericardium: There is no evidence of pericardial effusion. Mitral Valve: The mitral valve is normal in structure. There is mild thickening of the mitral valve leaflet(s). There is mild calcification of the mitral valve leaflet(s). Mild mitral annular calcification. Trivial mitral valve regurgitation. No evidence  of mitral valve stenosis. Tricuspid Valve: The tricuspid valve is normal in structure. Tricuspid valve regurgitation is mild . No evidence of tricuspid stenosis. Aortic Valve: The aortic valve is tricuspid. There is moderate calcification of the aortic valve. There is moderate thickening of the aortic valve. There is moderate aortic valve annular calcification. Aortic valve regurgitation is not visualized. No aortic stenosis is present. Aortic valve mean gradient measures 9.0 mmHg. Aortic valve peak gradient measures 17.8 mmHg. Aortic valve area, by VTI measures 1.66 cm. Pulmonic Valve: The pulmonic valve was not well visualized. Pulmonic valve regurgitation is mild. No evidence of pulmonic stenosis. Aorta: The aortic root is normal in size and structure. Pulmonary Artery: Indeterminant PASP, inadequate TR jet. Venous: The inferior vena cava is normal in size with greater than 50% respiratory variability, suggesting right atrial pressure of 3 mmHg. IAS/Shunts: No atrial level shunt detected by color flow Doppler. Additional Comments: A pacer wire is visualized.  LEFT VENTRICLE PLAX 2D LVIDd:         4.56 cm  Diastology LVIDs:         3.11 cm  LV e' medial:    4.57 cm/s LV PW:         1.21 cm  LV E/e' medial:  15.2 LV IVS:        1.10 cm  LV e' lateral:   6.53 cm/s  LVOT diam:     2.00 cm  LV E/e' lateral: 10.6 LV SV:         70 LV SV Index:   37 LVOT Area:     3.14 cm  RIGHT VENTRICLE RV S prime:     14.90  cm/s TAPSE (M-mode): 2.3 cm LEFT ATRIUM             Index       RIGHT ATRIUM           Index LA diam:        3.63 cm 1.95 cm/m  RA Area:     21.40 cm LA Vol (A2C):   91.1 ml 48.84 ml/m RA Volume:   65.90 ml  35.33 ml/m LA Vol (A4C):   72.3 ml 38.76 ml/m LA Biplane Vol: 86.3 ml 46.27 ml/m  AORTIC VALVE AV Area (Vmax):    1.89 cm AV Area (Vmean):   1.85 cm AV Area (VTI):     1.66 cm AV Vmax:           211.00 cm/s AV Vmean:          136.000 cm/s AV VTI:            0.419 m AV Peak Grad:      17.8 mmHg AV Mean Grad:      9.0 mmHg LVOT Vmax:         127.00 cm/s LVOT Vmean:        80.000 cm/s LVOT VTI:          0.222 m LVOT/AV VTI ratio: 0.53  AORTA Ao Root diam: 3.50 cm MITRAL VALVE               TRICUSPID VALVE MV Area (PHT): 1.88 cm    TR Peak grad:   30.0 mmHg MV Decel Time: 404 msec    TR Vmax:        274.00 cm/s MV E velocity: 69.40 cm/s MV A velocity: 95.90 cm/s  SHUNTS MV E/A ratio:  0.72        Systemic VTI:  0.22 m                            Systemic Diam: 2.00 cm Carlyle Dolly MD Electronically signed by Carlyle Dolly MD Signature Date/Time: 05/24/2020/1:15:45 PM    Final    VAS [name] LOWER EXTREMITY VENOUS REFLUX  Result Date: 05/20/2020  Lower Venous Reflux Study Indications: Varicosities, and ulceration.  Performing Technologist: Almira Coaster RVS  Examination Guidelines: A complete evaluation includes B-mode imaging, spectral Doppler, color Doppler, and power Doppler as needed of all accessible portions of each vessel. Bilateral testing is considered an integral part of a complete examination. Limited examinations for reoccurring indications may be performed as noted. The reflux portion of the exam is performed with the patient in reverse Trendelenburg. Significant venous reflux is defined as >500 ms in the superficial venous system, and >1 second in the deep venous system.  Venous Reflux Times +--------------+---------+------+-----------+------------+--------+ RIGHT         Reflux  NoRefluxReflux TimeDiameter cmsComments                         Yes                                  +--------------+---------+------+-----------+------------+--------+ CFV  no                                             +--------------+---------+------+-----------+------------+--------+ FV prox       no                                             +--------------+---------+------+-----------+------------+--------+ FV mid        no                                             +--------------+---------+------+-----------+------------+--------+ FV dist       no                                             +--------------+---------+------+-----------+------------+--------+ Popliteal     no                                             +--------------+---------+------+-----------+------------+--------+ GSV at SFJ    no                            .65              +--------------+---------+------+-----------+------------+--------+ GSV prox thighno                            .46              +--------------+---------+------+-----------+------------+--------+ GSV mid thigh no                            .15              +--------------+---------+------+-----------+------------+--------+ GSV dist thighno                            .35              +--------------+---------+------+-----------+------------+--------+ GSV at knee   no                            .29              +--------------+---------+------+-----------+------------+--------+ GSV prox calf no                            .33              +--------------+---------+------+-----------+------------+--------+ SSV Pop Fossa no                            .24              +--------------+---------+------+-----------+------------+--------+  +--------------+---------+------+-----------+------------+--------+ LEFT          Reflux NoRefluxReflux TimeDiameter  cmsComments                          Yes                                  +--------------+---------+------+-----------+------------+--------+ CFV           no                                             +--------------+---------+------+-----------+------------+--------+ FV prox       no                                             +--------------+---------+------+-----------+------------+--------+ FV mid        no                                             +--------------+---------+------+-----------+------------+--------+ FV dist       no                                             +--------------+---------+------+-----------+------------+--------+ Popliteal     no                                             +--------------+---------+------+-----------+------------+--------+ GSV at SFJ    no                            .49              +--------------+---------+------+-----------+------------+--------+ GSV prox thigh                              .44              +--------------+---------+------+-----------+------------+--------+ GSV mid thigh                               .44              +--------------+---------+------+-----------+------------+--------+ GSV dist thigh                              .39              +--------------+---------+------+-----------+------------+--------+ GSV at knee                                 .37              +--------------+---------+------+-----------+------------+--------+ GSV prox calf                               .  34              +--------------+---------+------+-----------+------------+--------+ SSV Pop Fossa no                            .32              +--------------+---------+------+-----------+------------+--------+   Summary: Bilateral: - No evidence of deep vein thrombosis seen in the lower extremities, bilaterally, from the common femoral through the popliteal veins.  - No evidence of  superficial venous thrombosis in the lower extremities, bilaterally.  - No evidence of deep venous insufficiency seen bilaterally in the lower extremity.  - No evidence of superficial venous reflux seen in the greater saphenous veins bilaterally.  - No evidence of superficial venous reflux seen in the short saphenous veins bilaterally.  *See table(s) above for measurements and observations. Electronically signed by Leotis Pain MD on 05/20/2020 at 12:26:50 PM.    Final        Subjective: Overall she is feeling better. Shortness of breath is improving. She does have a productive cough. Wheezing is better  Discharge Exam: Vitals:   05/25/20 1000 05/25/20 1200 05/25/20 1210 05/25/20 1341  BP: (!) 106/59     Pulse: 60     Resp: 16     Temp: 98.7 F (37.1 C)     TempSrc: Oral     SpO2: 96% (!) 85% 94% 97%  Weight:      Height:        General: Pt is alert, awake, not in acute distress Cardiovascular: RRR, S1/S2 +, no rubs, no gallops Respiratory: CTA bilaterally, no wheezing, no rhonchi Abdominal: Soft, NT, ND, bowel sounds + Extremities: no edema, no cyanosis    The results of significant diagnostics from this hospitalization (including imaging, microbiology, ancillary and laboratory) are listed below for reference.     Microbiology: Recent Results (from the past 240 hour(s))  Respiratory Panel by RT PCR (Flu A&B, Covid) - Nasopharyngeal Swab     Status: None   Collection Time: 05/23/20  7:58 PM   Specimen: Nasopharyngeal Swab  Result Value Ref Range Status   SARS Coronavirus 2 by RT PCR NEGATIVE NEGATIVE Final    Comment: (NOTE) SARS-CoV-2 target nucleic acids are NOT DETECTED.  The SARS-CoV-2 RNA is generally detectable in upper respiratoy specimens during the acute phase of infection. The lowest concentration of SARS-CoV-2 viral copies this assay can detect is 131 copies/mL. A negative result does not preclude SARS-Cov-2 infection and should not be used as the sole basis for  treatment or other patient management decisions. A negative result may occur with  improper specimen collection/handling, submission of specimen other than nasopharyngeal swab, presence of viral mutation(s) within the areas targeted by this assay, and inadequate number of viral copies (<131 copies/mL). A negative result must be combined with clinical observations, patient history, and epidemiological information. The expected result is Negative.  Fact Sheet for Patients:  PinkCheek.be  Fact Sheet for Healthcare Providers:  GravelBags.it  This test is no t yet approved or cleared by the [place] FDA and  has been authorized for detection and/or diagnosis of SARS-CoV-2 by FDA under an Emergency Use Authorization (EUA). This EUA will remain  in effect (meaning this test can be used) for the duration of the COVID-19 declaration under Section 564(b)(1) of the Act, 21 U.S.C. section 360bbb-3(b)(1), unless the authorization is terminated or revoked sooner.     Influenza A by PCR  NEGATIVE NEGATIVE Final   Influenza B by PCR NEGATIVE NEGATIVE Final    Comment: (NOTE) The Xpert Xpress SARS-CoV-2/FLU/RSV assay is intended as an aid in  the diagnosis of influenza from Nasopharyngeal swab specimens and  should not be used as a sole basis for treatment. Nasal washings and  aspirates are unacceptable for Xpert Xpress SARS-CoV-2/FLU/RSV  testing.  Fact Sheet for Patients: PinkCheek.be  Fact Sheet for Healthcare Providers: GravelBags.it  This test is not yet approved or cleared by the [place] FDA and  has been authorized for detection and/or diagnosis of SARS-CoV-2 by  FDA under an Emergency Use Authorization (EUA). This EUA will remain  in effect (meaning this test can be used) for the duration of the  Covid-19 declaration under Section 564(b)(1) of the Act, 21   U.S.C. section 360bbb-3(b)(1), unless the authorization is  terminated or revoked. Performed at [facility], [address], [place], [place] [address]      Labs: BNP (last 3 results) Recent Labs    05/23/20 2021  BNP 130.8*   Basic Metabolic Panel: Recent Labs  Lab 05/23/20 2021 05/24/20 0359  NA 135 136  K 3.8 3.8  CL 103 108  CO2 21* 21*  GLUCOSE 106* 123*  BUN 28* 26*  CREATININE 1.57* 1.32*  CALCIUM 9.2 8.4*   Liver Function Tests: Recent Labs  Lab 05/24/20 0359  AST 20  ALT 15  ALKPHOS 52  BILITOT 1.2  PROT 6.2*  ALBUMIN 3.0*   No results for input(s): LIPASE, AMYLASE in the last 168 hours. No results for input(s): AMMONIA in the last 168 hours. CBC: Recent Labs  Lab 05/23/20 2021 05/24/20 0359  WBC 11.2* 10.6*  HGB 12.3 10.9*  HCT 39.5 34.0*  MCV 99.2 97.1  PLT 157 144*   Cardiac Enzymes: No results for input(s): CKTOTAL, CKMB, CKMBINDEX, TROPONINI in the last 168 hours. BNP: Invalid input(s): POCBNP CBG: Recent Labs  Lab 05/23/20 2020  GLUCAP 90   D-Dimer No results for input(s): DDIMER in the last 72 hours. Hgb A1c No results for input(s): HGBA1C in the last 72 hours. Lipid Profile No results for input(s): CHOL, HDL, LDLCALC, TRIG, CHOLHDL, LDLDIRECT in the last 72 hours. Thyroid function studies Recent Labs    05/24/20 0359  TSH 1.653   Anemia work up No results for input(s): VITAMINB12, FOLATE, FERRITIN, TIBC, IRON, RETICCTPCT in the last 72 hours. Urinalysis    Component Value Date/Time   COLORURINE YELLOW 05/24/2020 0115   APPEARANCEUR HAZY (A) 05/24/2020 0115   APPEARANCEUR Clear 09/03/2015 1036   LABSPEC 1.017 05/24/2020 0115   PHURINE 6.0 05/24/2020 0115   GLUCOSEU NEGATIVE 05/24/2020 0115   HGBUR SMALL (A) 05/24/2020 0115   BILIRUBINUR NEGATIVE 05/24/2020 0115   BILIRUBINUR neg 11/08/2016 1519   BILIRUBINUR Negative 09/03/2015 1036   KETONESUR NEGATIVE 05/24/2020 0115   PROTEINUR 30 (A) 05/24/2020 0115    UROBILINOGEN 0.2 11/08/2016 1519   UROBILINOGEN 0.2 02/17/2011 1050   NITRITE NEGATIVE 05/24/2020 0115   LEUKOCYTESUR MODERATE (A) 05/24/2020 0115   Sepsis Labs Invalid input(s): PROCALCITONIN,  WBC,  LACTICIDVEN Microbiology Recent Results (from the past 240 hour(s))  Respiratory Panel by RT PCR (Flu A&B, Covid) - Nasopharyngeal Swab     Status: None   Collection Time: 05/23/20  7:58 PM   Specimen: Nasopharyngeal Swab  Result Value Ref Range Status   SARS Coronavirus 2 by RT PCR NEGATIVE NEGATIVE Final    Comment: (NOTE) SARS-CoV-2 target nucleic acids are NOT DETECTED.  The SARS-CoV-2 RNA is generally detectable in upper respiratoy specimens during the acute phase of infection. The lowest concentration of SARS-CoV-2 viral copies this assay can detect is 131 copies/mL. A negative result does not preclude SARS-Cov-2 infection and should not be used as the sole basis for treatment or other patient management decisions. A negative result may occur with  improper specimen collection/handling, submission of specimen other than nasopharyngeal swab, presence of viral mutation(s) within the areas targeted by this assay, and inadequate number of viral copies (<131 copies/mL). A negative result must be combined with clinical observations, patient history, and epidemiological information. The expected result is Negative.  Fact Sheet for Patients:  PinkCheek.be  Fact Sheet for Healthcare Providers:  GravelBags.it  This test is no t yet approved or cleared by the [place] FDA and  has been authorized for detection and/or diagnosis of SARS-CoV-2 by FDA under an Emergency Use Authorization (EUA). This EUA will remain  in effect (meaning this test can be used) for the duration of the COVID-19 declaration under Section 564(b)(1) of the Act, 21 U.S.C. section 360bbb-3(b)(1), unless the authorization is terminated or revoked  sooner.     Influenza A by PCR NEGATIVE NEGATIVE Final   Influenza B by PCR NEGATIVE NEGATIVE Final    Comment: (NOTE) The Xpert Xpress SARS-CoV-2/FLU/RSV assay is intended as an aid in  the diagnosis of influenza from Nasopharyngeal swab specimens and  should not be used as a sole basis for treatment. Nasal washings and  aspirates are unacceptable for Xpert Xpress SARS-CoV-2/FLU/RSV  testing.  Fact Sheet for Patients: PinkCheek.be  Fact Sheet for Healthcare Providers: GravelBags.it  This test is not yet approved or cleared by the [place] FDA and  has been authorized for detection and/or diagnosis of SARS-CoV-2 by  FDA under an Emergency Use Authorization (EUA). This EUA will remain  in effect (meaning this test can be used) for the duration of the  Covid-19 declaration under Section 564(b)(1) of the Act, 21  U.S.C. section 360bbb-3(b)(1), unless the authorization is  terminated or revoked. Performed at [facility], [address], [place], [place] [address]      Time coordinating discharge: 52mins  SIGNED:   Kathie Dike, MD  [facility] 05/25/2020, 8:06 PM   If 7PM-7AM, please contact night-coverage [email or web address]

## 2020-05-25 NOTE — Evaluation (Addendum)
Physical Therapy Evaluation Patient Details Name: Kristin [name] MRN: [id number] DOB: 1929/10/10 Today's Date: 05/25/2020   History of Present Illness  Kristin [name]  is a 86 y.o. female, with history of recurrent UTIs, overactive bladder, hypertension, hyperlipidemia, hepatitis C, gout, COPD, CHF, CKD, atrial fibrillation, and more presents to the ED with a chief complaint of generalized weakness.  Patient is a poor historian, but granddaughter is very helpful.  Patient reports that she has felt weak for 2 days.  She describes it as her legs feeling weak, and they are also kind of painful.  But when she describes the pain she says it is like a heaviness.  Granddaughter reports that patient is completely oriented and takes care of herself.  She even drove herself to get her hair done 2 days ago.  Patient reports that she has has not felt right though in the past 2 days.  During this time she is also been short of breath.  Been getting worse over 2 days.  She does deny chest pain and palpitations.  Granddaughter notes that patient has not been eating and drinking in the past day and a half.  She has had decreased urine output and the urine output has been dark.  Patient denies dysuria.  Patient does report that she has low back pain, its in the center of her back.  Is the same as her normal arthritis pain in her back.  Patient admits to having fever at home.  She has not taken any medication for it.  She denies any diaphoresis  Clinical Impression  PT able to complete bed mobility with min assist.  This is not unusual as she has not been up since Thursday.  Pt demonstrated steadiness with ambulation but fatigues easily.  Recommend HH    Follow Up Recommendations Home health PT    Equipment Recommendations  none   Recommendations for Other Services  none     Precautions / Restrictions Precautions Precautions: None Restrictions Weight Bearing Restrictions: No      Mobility  Bed Mobility Overal  bed mobility: Modified Independent                Transfers Overall transfer level: Modified independent                  Ambulation/Gait Ambulation/Gait assistance: Modified independent (Device/Increase time) Gait Distance (Feet): 60 Feet Assistive device: Rolling walker (2 wheeled) Gait Pattern/deviations: Decreased step length - right;Decreased step length - left                 Pertinent Vitals/Pain Pain Assessment: No/denies pain Pain Location: PT has a chronic bad back that goes to a 6/10 when she is up Pain Intervention(s): Limited activity within patient's tolerance    Home Living Family/patient expects to be discharged to:: Private residence Living Arrangements: Children Available Help at Discharge: Family Type of Home: House Home Access: Level entry     Home Layout: Able to live on main level with bedroom/bathroom Home Equipment: [name] - single point      Prior Function Level of Independence: Independent with assistive device(s)         Comments: PT son stays with her; son has just had his leg amputated and is not much help.  Granddaugher lives two houses down but is a full Theatre manager.     Hand Dominance        Extremity/Trunk Assessment   Upper Extremity Assessment Upper Extremity Assessment: Defer to OT  evaluation    Lower Extremity Assessment Lower Extremity Assessment: Overall WFL for tasks assessed       Communication   Communication: No difficulties  Cognition Arousal/Alertness: Awake/alert Behavior During Therapy: WFL for tasks assessed/performed Overall Cognitive Status: Within Functional Limits for tasks assessed                                        General Comments      Exercises General Exercises - Lower Extremity Ankle Circles/Pumps: AROM;Both;10 reps Quad Sets: Both;10 reps Gluteal Sets: 10 reps Long Arc Quad: Both;10 reps Heel Slides: Both;10 reps Hip ABduction/ADduction: Both;10  reps   Assessment/Plan    PT Assessment All further PT needs can be met in the next venue of care  PT Problem List Decreased strength;Decreased activity tolerance;Pain       PT Treatment Interventions      PT Goals (Current goals can be found in the Care Plan section)  Acute Rehab PT Goals Patient Stated Goal: to go home PT Goal Formulation: With patient Time For Goal Achievement: 05/26/20 Potential to Achieve Goals: Good    Frequency  3x a week    Barriers to discharge  none       Co-evaluation               AM-PAC PT "6 Clicks" Mobility  Outcome Measure Help needed turning from your back to your side while in a flat bed without using bedrails?: A Little Help needed moving from lying on your back to sitting on the side of a flat bed without using bedrails?: A Little Help needed moving to and from a bed to a chair (including a wheelchair)?: A Little Help needed standing up from a chair using your arms (e.g., wheelchair or bedside chair)?: A Little Help needed to walk in hospital room?: None Help needed climbing 3-5 steps with a railing? : A Little 6 Click Score: 19    End of Session Equipment Utilized During Treatment: Gait belt Activity Tolerance: Patient tolerated treatment well Patient left: in chair Nurse Communication: Mobility status PT Visit Diagnosis: Muscle weakness (generalized) (M62.81)    Time: (P) 0940-  1020     Charges:   PT Evaluation $PT Eval Low Complexity: (P) 1 Low PT Treatments $Gait Training: (P) 8-22 mins        Rayetta Humphrey, PT CLT [phone] 05/25/2020, 10:20 AM

## 2020-05-26 ENCOUNTER — Telehealth: Payer: Self-pay

## 2020-05-26 NOTE — Telephone Encounter (Signed)
I can try to use records from ED as documentation if it is required.  Usually we need office visit to document for equipment orders.  If you can contact [facility] and find out best way to order this for her.  I do not see an order for portable oxygen concentrator through [employer].  She still needs to keep an apt within 1 week if possible.  [name], DO [facility] 05/26/2020, 6:51 PM

## 2020-05-26 NOTE — Telephone Encounter (Signed)
Transition Care Management Follow-up Telephone Call  Date of discharge and from where:  [facility] 05/25/2020  How have you been since you were released from the hospital? weak  Any questions or concerns? No  Items Reviewed:  Did the pt receive and understand the discharge instructions provided? Yes   Medications obtained and verified? Yes   Any new allergies since your discharge? No   Dietary orders reviewed? Yes  Do you have support at home? Yes   Functional Questionnaire: (I = Independent and D = Dependent) ADLs: I  Bathing/Dressing- I  Meal Prep- I  Eating- I  Maintaining continence- I  Transferring/Ambulation- I  Managing Meds- I  Follow up appointments reviewed:   PCP Hospital f/u appt confirmed? Yes  Scheduled to see Dr. Parks Ranger on 06/03/2020 @ 2:20p..  Are transportation arrangements needed? No   If their condition worsens, is the pt aware to call PCP or go to the Emergency Dept.? Yes  Was the patient provided with contact information for the PCP's office or ED? Yes  Was to pt encouraged to call back with questions or concerns? Yes

## 2020-05-26 NOTE — Telephone Encounter (Signed)
Copied from [place] [id number]. Topic: General - Other >> May 26, 2020  2:48 PM Oneta Rack wrote: Patient was discharged from [facility] 05/25/2020 and grad daughter would like patient seen sooner then 06/03/2020 please call regarding a possible work in [phone].    Requesting orders for portable oxygen concentrator, patient curently has one that plugs into the wall

## 2020-05-26 NOTE — Telephone Encounter (Signed)
As per patient's grand daughter she was taken to ED last week diagnosed with pneumonia was send home, needed Oxygen for 24 hours at night she is requesting portable oxygen concentrator through [place] no appointment is available till next week she is asking if Dr Raliegh Ip can able to send this order ?

## 2020-05-27 NOTE — Telephone Encounter (Signed)
Will wait on message from [place]. We need to find out how do we order the portable oxygen concentrator if we keep her at 2 LPM oxygen. And we are not increasing up to 5 L.  They should have hospital discharge.  If she needs any higher concentration of oxygen >2 L then she will need an office visit and do 6 min walk test.  Let me know if they respond or I will see the message as well if it is sent to me.  [name], DO [facility] 05/27/2020, 6:11 PM

## 2020-05-28 ENCOUNTER — Other Ambulatory Visit: Payer: Self-pay | Admitting: Family Medicine

## 2020-05-28 ENCOUNTER — Other Ambulatory Visit: Payer: Self-pay | Admitting: Nurse Practitioner

## 2020-05-28 DIAGNOSIS — I872 Venous insufficiency (chronic) (peripheral): Secondary | ICD-10-CM | POA: Diagnosis not present

## 2020-05-28 DIAGNOSIS — I130 Hypertensive heart and chronic kidney disease with heart failure and stage 1 through stage 4 chronic kidney disease, or unspecified chronic kidney disease: Secondary | ICD-10-CM | POA: Diagnosis not present

## 2020-05-28 DIAGNOSIS — J440 Chronic obstructive pulmonary disease with acute lower respiratory infection: Secondary | ICD-10-CM | POA: Diagnosis not present

## 2020-05-28 DIAGNOSIS — I10 Essential (primary) hypertension: Secondary | ICD-10-CM

## 2020-05-28 DIAGNOSIS — J9621 Acute and chronic respiratory failure with hypoxia: Secondary | ICD-10-CM | POA: Diagnosis not present

## 2020-05-28 DIAGNOSIS — M1030 Gout due to renal impairment, unspecified site: Secondary | ICD-10-CM | POA: Diagnosis not present

## 2020-05-28 DIAGNOSIS — N189 Chronic kidney disease, unspecified: Secondary | ICD-10-CM | POA: Diagnosis not present

## 2020-05-28 DIAGNOSIS — I5032 Chronic diastolic (congestive) heart failure: Secondary | ICD-10-CM

## 2020-05-28 DIAGNOSIS — J189 Pneumonia, unspecified organism: Secondary | ICD-10-CM | POA: Diagnosis not present

## 2020-05-28 DIAGNOSIS — L97829 Non-pressure chronic ulcer of other part of left lower leg with unspecified severity: Secondary | ICD-10-CM | POA: Diagnosis not present

## 2020-05-28 DIAGNOSIS — R1031 Right lower quadrant pain: Secondary | ICD-10-CM

## 2020-05-28 DIAGNOSIS — I509 Heart failure, unspecified: Secondary | ICD-10-CM | POA: Diagnosis not present

## 2020-05-28 NOTE — Telephone Encounter (Signed)
Patient advised.

## 2020-05-30 ENCOUNTER — Encounter (INDEPENDENT_AMBULATORY_CARE_PROVIDER_SITE_OTHER): Payer: Self-pay

## 2020-05-30 ENCOUNTER — Other Ambulatory Visit: Payer: Self-pay

## 2020-05-30 ENCOUNTER — Ambulatory Visit (INDEPENDENT_AMBULATORY_CARE_PROVIDER_SITE_OTHER): Payer: Medicare HMO | Admitting: Nurse Practitioner

## 2020-05-30 VITALS — BP 167/82 | HR 83 | Resp 16 | Wt 163.4 lb

## 2020-05-30 DIAGNOSIS — L97321 Non-pressure chronic ulcer of left ankle limited to breakdown of skin: Secondary | ICD-10-CM | POA: Diagnosis not present

## 2020-05-30 DIAGNOSIS — I83223 Varicose veins of left lower extremity with both ulcer of ankle and inflammation: Secondary | ICD-10-CM | POA: Diagnosis not present

## 2020-05-30 NOTE — Progress Notes (Signed)
History of Present Illness  There is no documented history at this time  Assessments & Plan   There are no diagnoses linked to this encounter.    Additional instructions  Subjective:  Patient presents with venous ulcer of the Left lower extremity.    Procedure:  3 layer unna wrap was placed Left lower extremity.   Plan:   Follow up in one week.  

## 2020-06-02 ENCOUNTER — Telehealth: Payer: Self-pay

## 2020-06-02 ENCOUNTER — Telehealth: Payer: Self-pay | Admitting: Family Medicine

## 2020-06-02 ENCOUNTER — Encounter (INDEPENDENT_AMBULATORY_CARE_PROVIDER_SITE_OTHER): Payer: Self-pay | Admitting: Nurse Practitioner

## 2020-06-02 DIAGNOSIS — J440 Chronic obstructive pulmonary disease with acute lower respiratory infection: Secondary | ICD-10-CM | POA: Diagnosis not present

## 2020-06-02 DIAGNOSIS — N189 Chronic kidney disease, unspecified: Secondary | ICD-10-CM | POA: Diagnosis not present

## 2020-06-02 DIAGNOSIS — I872 Venous insufficiency (chronic) (peripheral): Secondary | ICD-10-CM | POA: Diagnosis not present

## 2020-06-02 DIAGNOSIS — L97829 Non-pressure chronic ulcer of other part of left lower leg with unspecified severity: Secondary | ICD-10-CM | POA: Diagnosis not present

## 2020-06-02 DIAGNOSIS — I509 Heart failure, unspecified: Secondary | ICD-10-CM | POA: Diagnosis not present

## 2020-06-02 DIAGNOSIS — I130 Hypertensive heart and chronic kidney disease with heart failure and stage 1 through stage 4 chronic kidney disease, or unspecified chronic kidney disease: Secondary | ICD-10-CM | POA: Diagnosis not present

## 2020-06-02 DIAGNOSIS — J189 Pneumonia, unspecified organism: Secondary | ICD-10-CM | POA: Diagnosis not present

## 2020-06-02 DIAGNOSIS — J9621 Acute and chronic respiratory failure with hypoxia: Secondary | ICD-10-CM | POA: Diagnosis not present

## 2020-06-02 DIAGNOSIS — M1030 Gout due to renal impairment, unspecified site: Secondary | ICD-10-CM | POA: Diagnosis not present

## 2020-06-02 NOTE — Telephone Encounter (Signed)
Verbal left on Amy voicemail for approval OT evaluation.

## 2020-06-02 NOTE — Telephone Encounter (Signed)
Can they take a verbal?  Can have OT eval

## 2020-06-02 NOTE — Telephone Encounter (Signed)
Verbal given for OT therapy w/ [facility]. I called back and spoke with Sharyn Lull, [place].

## 2020-06-02 NOTE — Telephone Encounter (Signed)
Copied from [place] [id number]. Topic: General - Other >> May 30, 2020 11:15 AM Oneta Rack wrote: Amy from [facility] leave VM   PT 2x 3 1x 4 requesting OT Eval for right shoulder

## 2020-06-02 NOTE — Telephone Encounter (Signed)
Copied from [place] [id number]. Topic: Quick Communication - Home Health Verbal Orders >> Jun 02, 2020  2:50 PM Yvette Rack wrote: Caller/Agency: Sharyn Lull with [facility] Number: [phone] Requesting OT/PT/Skilled Nursing/Social Work/Speech Therapy: OT  Frequency: 2 week 2, and 1 week 3

## 2020-06-03 ENCOUNTER — Encounter: Payer: Self-pay | Admitting: Family Medicine

## 2020-06-03 ENCOUNTER — Inpatient Hospital Stay: Payer: Medicare HMO | Admitting: Family Medicine

## 2020-06-03 ENCOUNTER — Other Ambulatory Visit: Payer: Self-pay

## 2020-06-03 ENCOUNTER — Ambulatory Visit (INDEPENDENT_AMBULATORY_CARE_PROVIDER_SITE_OTHER): Payer: Medicare HMO | Admitting: Family Medicine

## 2020-06-03 VITALS — BP 118/54 | HR 64 | Temp 96.9°F | Resp 16 | Ht 67.0 in | Wt 157.0 lb

## 2020-06-03 DIAGNOSIS — J9611 Chronic respiratory failure with hypoxia: Secondary | ICD-10-CM

## 2020-06-03 DIAGNOSIS — J432 Centrilobular emphysema: Secondary | ICD-10-CM

## 2020-06-03 DIAGNOSIS — N183 Chronic kidney disease, stage 3 unspecified: Secondary | ICD-10-CM

## 2020-06-03 DIAGNOSIS — I129 Hypertensive chronic kidney disease with stage 1 through stage 4 chronic kidney disease, or unspecified chronic kidney disease: Secondary | ICD-10-CM

## 2020-06-03 NOTE — Patient Instructions (Addendum)
Thank you for coming to the office today.  Start multivitamin once daily  Increase Boost x 2 daily  Ordering the portable oxygen concentrator through [place] - stay tuned for updates  Pulse ox with walking for 2 minutes down to 85%, then seated rested was 85%, increased back up into range after several minutes.  [employer] [facility] [place] covid testing  Our building here 530pm Tues / Thurs - can schedule in advance.  You may have coronavirus / [place] Testing Information  LOCATIONS:  [address], [place], [place] [address] Monday 9:30am - 4:15pm Wednesday 9:00am - 4:30pm  [facility], [place] [address] [place] [address] Tuesday - 5:30pm to 6:30pm Thursday - 5:30pm to 6:30pm  COVID-19 Testing By Appointment Only  Online scheduling can be done online at [email or web address]  For assistance in scheduling a COVID-19 test, please call [phone].  Test result may take 2-7 days to result. You will be notified by MyChart or by Phone.  REQUIRED self quarantine to [place] - advised to avoid all exposure with others while during treatment. Should continue to quarantine for up to 10-14 days, pending resolution of symptoms, if symptoms resolve by 10-14 days and is afebrile >3 days - may STOP self quarantine at that time.  If symptoms do not resolve or significantly improve OR if WORSENING - fever / cough - or worsening shortness of breath - then should contact us and seek advice on next steps in treatment at home vs where/when to seek care at Urgent Care or Hospital ED for further intervention  DUE for FASTING BLOOD WORK (no food or drink after midnight before the lab appointment, only water or coffee without cream/sugar on the morning of)  SCHEDULE "Lab Only" visit in the morning at the clinic for lab draw in 1 WEEK  - Make sure Lab Only appointment is at about 1 week before  your next appointment, so that results will be available  For Lab Results, once available within 2-3 days of blood draw, you can can log in to MyChart online to view your results and a brief explanation. Also, we can discuss results at next follow-up visit.    Please schedule a Follow-up Appointment to: Return in about 3 months (around 09/03/2020) for 3 month COPD, CHF, oxygen.  If you have any other questions or concerns, please feel free to call the office or send a message through [place]. You may also schedule an earlier appointment if necessary.  Additionally, you may be receiving a survey about your experience at our office within a few days to 1 week by e-mail or mail. We value your feedback.  [name], DO [facility]

## 2020-06-03 NOTE — Progress Notes (Signed)
Subjective:    [name], female    DOB: [birth year], 86 y.o.   MRN: [id number]  Kristin [name] is a 86 y.o. female presenting on 06/03/2020 for Hospitalization Follow-up (6 min walk for getting portable machine) and COPD   HPI   HOSPITAL FOLLOW-UP VISIT  Hospital/Location: [facility] Date of Admission: 05/23/20 Date of Discharge: 05/25/20 Transitions of care telephone call: completed on 05/26/20 by Kellie Simmering LPN  Reason for Admission: generalized weakness, copd exacerbation Primary (+Secondary) Diagnosis: hypoxia chronic respiratory failure  [facility] H&P and Discharge Summary have been reviewed - Patient presents today about 9 days after recent hospitalization. Brief summary of recent course, patient had symptoms of weakness, dyspnea COPD exacerbation, hospitalized, treated with steroid taper and oxygen, without acute CHF, improved, discharged to home with home oxygen.  - Today reports overall has done well after discharge. Symptoms of dyspnea have improved. Needs portable oxygen concentrator to be able to function at home to have oxygen with her for constant use 24 hrs - Admits some poor appetite, poor PO intake   I have reviewed the discharge medication list, and have reconciled the current and discharge medications today.   Depression screen [facility] 2/9 05/13/2020 04/01/2020 03/17/2020  Decreased Interest 0 0 0  Down, Depressed, Hopeless 0 0 0  PHQ - 2 Score 0 0 0  Altered sleeping - - -  Tired, decreased energy - - -  Change in appetite - - -  Feeling bad or failure about yourself  - - -  Trouble concentrating - - -  Moving slowly or fidgety/restless - - -  Suicidal thoughts - - -  PHQ-9 Score - - -  Difficult doing work/chores - - -  Some recent data might be hidden    Social History   Tobacco Use  . Smoking status: Former Smoker    Packs/day: 1.25    Years: 40.00    Pack years: 50.00    Types: Cigarettes    Quit date: 12/16/1998    Years  since quitting: 21.4  . Smokeless tobacco: Former Network engineer  . Vaping Use: Never used  Substance Use Topics  . Alcohol use: No    Alcohol/week: 0.0 standard drinks  . Drug use: Yes    Types: Oxycodone    Review of Systems Per HPI unless specifically indicated above     Objective:    BP (!) 118/54   Pulse 64   Temp (!) 96.9 F (36.1 C) (Temporal)   Resp 16   Ht 5\' 7"  (1.702 m)   Wt 157 lb (71.2 kg)   SpO2 97% Comment: on 2 liter of O2 at home  BMI 24.59 kg/m   Wt Readings from Last 3 Encounters:  06/03/20 157 lb (71.2 kg)  05/30/20 163 lb 6.4 oz (74.1 kg)  05/23/20 165 lb 5.5 oz (75 kg)    Physical Exam Vitals and nursing note reviewed.  Constitutional:      General: She is not in acute distress.    Appearance: She is well-developed. She is not diaphoretic.     Comments: Chronically ill appearing and thin 86 yr female, comfortable, cooperative  HENT:     Head: Normocephalic and atraumatic.  Eyes:     General:        Right eye: No discharge.        Left eye: No discharge.     Conjunctiva/sclera: Conjunctivae normal.  Cardiovascular:  Rate and Rhythm: Normal rate.  Pulmonary:     Breath sounds: Rhonchi present. No wheezing.     Comments: Reduced air movement Skin:    General: Skin is warm and dry.     Findings: No erythema or rash.  Neurological:     Mental Status: She is alert and oriented to person, place, and time.  Psychiatric:        Behavior: Behavior normal.     Comments: Well groomed, good eye contact, normal speech and thoughts      Results for orders placed or performed during the hospital encounter of 05/23/20  Respiratory Panel by RT PCR (Flu A&B, Covid) - Nasopharyngeal Swab   Specimen: Nasopharyngeal Swab  Result Value Ref Range   SARS Coronavirus 2 by RT PCR NEGATIVE NEGATIVE   Influenza A by PCR NEGATIVE NEGATIVE   Influenza B by PCR NEGATIVE NEGATIVE  Basic metabolic panel  Result Value Ref Range   Sodium 135 135 - 145  mmol/L   Potassium 3.8 3.5 - 5.1 mmol/L   Chloride 103 98 - 111 mmol/L   CO2 21 (L) 22 - 32 mmol/L   Glucose, Bld 106 (H) 70 - 99 mg/dL   BUN 28 (H) 8 - 23 mg/dL   Creatinine, Ser 1.57 (H) 0.44 - 1.00 mg/dL   Calcium 9.2 8.9 - 10.3 mg/dL   GFR calc non Af Amer 29 (L) >60 mL/min   GFR calc Af Amer 34 (L) >60 mL/min   Anion gap 11 5 - 15  CBC  Result Value Ref Range   WBC 11.2 (H) 4.0 - 10.5 K/uL   RBC 3.98 3.87 - 5.11 MIL/uL   Hemoglobin 12.3 12.0 - 15.0 g/dL   HCT 39.5 36 - 46 %   MCV 99.2 80.0 - 100.0 fL   MCH 30.9 26.0 - 34.0 pg   MCHC 31.1 30.0 - 36.0 g/dL   RDW 13.7 11.5 - 15.5 %   Platelets 157 150 - 400 K/uL   nRBC 0.0 0.0 - 0.2 %  Urinalysis, Routine w reflex microscopic Urine, Clean Catch  Result Value Ref Range   Color, Urine YELLOW YELLOW   APPearance HAZY (A) CLEAR   Specific Gravity, Urine 1.017 1.005 - 1.030   pH 6.0 5.0 - 8.0   Glucose, UA NEGATIVE NEGATIVE mg/dL   Hgb urine dipstick SMALL (A) NEGATIVE   Bilirubin Urine NEGATIVE NEGATIVE   Ketones, ur NEGATIVE NEGATIVE mg/dL   Protein, ur 30 (A) NEGATIVE mg/dL   Nitrite NEGATIVE NEGATIVE   Leukocytes,Ua MODERATE (A) NEGATIVE   RBC / HPF 0-5 0 - 5 RBC/hpf   WBC, UA 21-50 0 - 5 WBC/hpf   Bacteria, UA RARE (A) NONE SEEN   Squamous Epithelial / LPF 11-20 0 - 5  Brain natriuretic peptide  Result Value Ref Range   B Natriuretic Peptide 994.0 (H) 0.0 - 100.0 pg/mL  Blood gas, arterial (at [facility])  Result Value Ref Range   FIO2 40.00    pH, Arterial 7.422 7.35 - 7.45   pCO2 arterial 35.2 32 - 48 mmHg   pO2, Arterial 69.4 (L) 83 - 108 mmHg   Bicarbonate 23.5 20.0 - 28.0 mmol/L   Acid-base deficit 1.3 0.0 - 2.0 mmol/L   O2 Saturation 93.7 %   Patient temperature 37.0    Allens test (pass/fail) NOT INDICATED (A) PASS  HIV Antibody (routine testing w rflx)  Result Value Ref Range   HIV Screen 4th Generation wRfx Non Reactive  Non Reactive  CBC  Result Value Ref Range   WBC 10.6 (H) 4.0 - 10.5 K/uL   RBC  3.50 (L) 3.87 - 5.11 MIL/uL   Hemoglobin 10.9 (L) 12.0 - 15.0 g/dL   HCT 34.0 (L) 36 - 46 %   MCV 97.1 80.0 - 100.0 fL   MCH 31.1 26.0 - 34.0 pg   MCHC 32.1 30.0 - 36.0 g/dL   RDW 13.6 11.5 - 15.5 %   Platelets 144 (L) 150 - 400 K/uL   nRBC 0.0 0.0 - 0.2 %  Comprehensive metabolic panel  Result Value Ref Range   Sodium 136 135 - 145 mmol/L   Potassium 3.8 3.5 - 5.1 mmol/L   Chloride 108 98 - 111 mmol/L   CO2 21 (L) 22 - 32 mmol/L   Glucose, Bld 123 (H) 70 - 99 mg/dL   BUN 26 (H) 8 - 23 mg/dL   Creatinine, Ser 1.32 (H) 0.44 - 1.00 mg/dL   Calcium 8.4 (L) 8.9 - 10.3 mg/dL   Total Protein 6.2 (L) 6.5 - 8.1 g/dL   Albumin 3.0 (L) 3.5 - 5.0 g/dL   AST 20 15 - 41 U/L   ALT 15 0 - 44 U/L   Alkaline Phosphatase 52 38 - 126 U/L   Total Bilirubin 1.2 0.3 - 1.2 mg/dL   GFR calc non Af Amer 36 (L) >60 mL/min   GFR calc Af Amer 42 (L) >60 mL/min   Anion gap 7 5 - 15  TSH  Result Value Ref Range   TSH 1.653 0.350 - 4.500 uIU/mL  CBG monitoring, ED  Result Value Ref Range   Glucose-Capillary 90 70 - 99 mg/dL  ECHOCARDIOGRAM COMPLETE  Result Value Ref Range   Weight 2,645.52 oz   Height 67 in   BP 106/53 mmHg   AR max vel 1.89 cm2   AV Area VTI 1.66 cm2   AV Mean grad 9.0 mmHg   AV Peak grad 17.8 mmHg   Ao pk vel 2.11 m/s   AV Area mean vel 1.85 cm2   Area-P 1/2 1.88 cm2   S' Lateral 3.11 cm  Troponin I (High Sensitivity)  Result Value Ref Range   Troponin I (High Sensitivity) 36 (H) <18 ng/L  Troponin I (High Sensitivity)  Result Value Ref Range   Troponin I (High Sensitivity) 34 (H) <18 ng/L  Troponin I (High Sensitivity)  Result Value Ref Range   Troponin I (High Sensitivity) 40 (H) <18 ng/L      Assessment & Plan:   Problem List Items Addressed This Visit    Respiratory failure with hypoxia (HCC)   Centrilobular emphysema (HCC) - Primary   Relevant Orders   COMPLETE METABOLIC PANEL WITH GFR   CBC with Differential/Platelet   Benign hypertension with CKD (chronic  kidney disease) stage III (HCC)   Relevant Orders   COMPLETE METABOLIC PANEL WITH GFR   CBC with Differential/Platelet      #COPD Centrilobular Emphysema, Chronic Respiratory Failure S/p AECOPD On prolong prednisone taper On oxygen supplemental, needs portable oxygen concentrator Weight down, less likely acute CHF not on imaging, continue diuretic no change  #Low Protein Reduced PO intake, weight loss Goal to improve nutrition PO intake, add Boost supplement twice a day between meals   6 min walk test Ambulation (room air) desaturation to 85% after 2 min walk, stopped walking due to shortness of breath Sitting resting on room air 88% Sitting resting on oxygen 2L 95%  Orders Placed  This Encounter  Procedures  . For home use only DME oxygen    Please provide POC    Order Specific Question:   Length of Need    Answer:   Lifetime    Order Specific Question:   Mode or (Route)    Answer:   Nasal cannula    Order Specific Question:   Liters per Minute    Answer:   2    Order Specific Question:   Frequency    Answer:   Continuous (stationary and portable oxygen unit needed)    Order Specific Question:   Oxygen conserving device    Answer:   Yes    Order Specific Question:   Oxygen delivery system    Answer:   Gas  . COMPLETE METABOLIC PANEL WITH GFR  . CBC with Differential/Platelet      No orders of the defined types were placed in this encounter.     Follow up plan: Return in about 3 months (around 09/03/2020) for 3 month COPD, CHF, oxygen.   [name], [facility] 06/03/2020, 3:09 PM

## 2020-06-03 NOTE — Progress Notes (Signed)
PROVIDER NOTE: Information contained herein reflects review and annotations entered in association with encounter. Interpretation of such information and data should be left to medically-trained personnel. Information provided to patient can be located elsewhere in the medical record under "Patient Instructions". Document created using STT-dictation technology, any transcriptional errors that may result from process are unintentional.    Patient: Kristin [name]  Service Category: E/M  Provider: Gaspar Cola, MD  DOB: 07/02/1930  DOS: 06/04/2020  Specialty: Interventional Pain Management  MRN: [id number]  Setting: Ambulatory outpatient  PCP: Olin Hauser, DO  Type: Established Patient    Referring Provider: [name] *  Location: Office  Delivery: Face-to-face     HPI  [name], a 86 y.o. year old female, is here today because of her No primary diagnosis found.. [name] primary complain today is Back Pain (lumbar bilateral left is worse ) Last encounter: My last encounter with her was on 03/17/2020. Pertinent problems: 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); Epidural fibrosis; Lumbar foraminal stenosis (Severe Left L4-5); Chronic sacroiliac joint pain (Left); Osteoarthritis involving multiple joints; Chronic neck pain; Chronic hip pain (Bilateral); Chronic cervical radicular pain (Tertiary Area of Pain) (Bilateral) (L>R); Chronic  lower extremity pain (Secondary Area of Pain) (Bilateral) (L>R); DDD (degenerative disc disease), cervical; Numbness and tingling of upper extremity (Bilateral); Chronic shoulder pain, Radicular (Bilateral) (L>R); Cervical facet syndrome (Bilateral) (L>R); Cervical (3-4 mm) Anterolisthesis of C4 on C5; Cervical foraminal stenosis (Bilateral); Spondylosis without myelopathy or radiculopathy, lumbosacral region; Chronic shoulders pain (Bilateral) (L>R); Osteoarthritis of shoulder (Bilateral); Osteoarthritis of hip (Bilateral); Chronic shoulder pain after shoulder replacement (Bilateral) (L>R); Trigger finger, left middle finger; Cervicalgia; Greater trochanteric bursitis of hip (Left); Other specified dorsopathies, sacral and sacrococcygeal region; Chronic hip pain (Left); Osteoarthritis of hip (Left); and Abnormal MRI, lumbar spine (11/03/2019) on their pertinent problem list. Pain Assessment: Severity of Chronic pain is reported as a 5 /10. Location: Back Lower, Left, Right/into the left hip. Onset: More than a month ago. Quality: Discomfort, Constant, Aching. Timing: Constant. Modifying factor(s): sitting down or laying down makes the pain a lot better. Vitals:  height is '5\' 6"'  (1.676 m) and weight is 157 lb (71.2 kg). Her temporal temperature is 97.2 F (36.2 C) (abnormal). Her blood pressure is 72/57 (abnormal) and her pulse is 69. Her respiration is 16 and oxygen saturation is 97%.   Reason for encounter: medication management.  The patient indicates doing well with the current medication regimen. No adverse reactions or side effects reported to the medications.  The patient comes into the clinic today after recently having been hospitalized due to a pneumonia.  She refers that she is doing much better she but she still feels weak.  Were not planning on doing any type of interventional therapy until she fully recovers from that.  The patient is UDS and PMP compliant.  RTCB: 09/24/2020 Nonopioid transfer:  Requip, MiraLAX, Dulcolax, and melatonin.  Pharmacotherapy Assessment   Analgesic: Oxycodone IR 20 mg, 1 tab PO q 8 hrs (60 mg/day of oxycodone) MME/day: 90 mg/day.  Monitoring: [place] PMP: PDMP reviewed during this encounter.       Pharmacotherapy: No side-effects or adverse reactions reported. Compliance: No problems identified. Effectiveness: Clinically acceptable.  Janett Billow, RN  06/04/2020 10:43 AM  Sign when Signing Visit Nursing Pain Medication Assessment:  Safety precautions to be maintained throughout the outpatient stay will include: orient to surroundings, keep bed in low position, maintain call bell within reach at all times, provide assistance with transfer out of bed and ambulation.  Medication Inspection Compliance: Pill count conducted under aseptic conditions, in front of the patient. Neither the pills nor the bottle was removed from the patient's sight at any time. Once count was completed pills were immediately returned to the patient in their original bottle.  Medication: Oxycodone IR Pill/Patch Count: 74 of 90 pills remain Pill/Patch Appearance: Markings consistent with prescribed medication Bottle Appearance: Standard pharmacy container. Clearly labeled. Filled Date: 10 / 05 / 2021 Last Medication intake:  Today    UDS:  Summary  Date Value Ref Range Status  02/19/2020 Note  Final    Comment:    ==================================================================== [employer] [facility] (MW) ==================================================================== Test                             Result       Flag       Units  Drug Present and Declared for Prescription Verification   Oxycodone                      4108         EXPECTED   ng/mg creat   Oxymorphone                    1875         EXPECTED   ng/mg creat   Noroxycodone                   >7353        EXPECTED   ng/mg creat   Noroxymorphone                 835          EXPECTED   ng/mg creat     Sources of oxycodone are scheduled prescription medications.    Oxymorphone, noroxycodone, and noroxymorphone are expected    metabolites of oxycodone. Oxymorphone is also available as a    scheduled prescription medication.  ==================================================================== Test                      Result    Flag   Units      Ref Range   Creatinine              136              mg/dL      >=20 ==================================================================== Declared Medications:  The flagging and interpretation on this report are based on the  following declared medications.  Unexpected results may arise from  inaccuracies in the declared medications.   **Note: The testing scope of this panel includes these medications:   Oxycodone   **Note: The testing scope of this panel does not include the  following reported medications:   Acetaminophen (Tylenol)  Albuterol  Apixaban (Eliquis)  Bisacodyl  Calcium  Furosemide (Lasix)  Helium  Levocetirizine (Xyzal)  Lisinopril (Zestril)  Melatonin  Oxygen  Polyethylene Glycol (MiraLAX)  Ropinirole (  Requip)  Supplement  Vitamin D ==================================================================== For clinical consultation, please call [phone]. ====================================================================      ROS  Constitutional: Denies any fever or chills Gastrointestinal: No reported hemesis, hematochezia, vomiting, or acute GI distress Musculoskeletal: Denies any acute onset joint swelling, redness, loss of ROM, or weakness Neurological: No reported episodes of acute onset apraxia, aphasia, dysarthria, agnosia, amnesia, paralysis, loss of coordination, or loss of consciousness  Medication Review  Benefiber, Calcium Carbonate-Vitamin D, Melatonin, Oxycodone HCl, Oxygen-Helium, Vitamin D, acetaminophen, albuterol, apixaban, bisacodyl, dextromethorphan-guaiFENesin, furosemide, gabapentin,  levocetirizine, mupirocin ointment, polyethylene glycol, and rOPINIRole  History Review  Allergy: Ms. Hollin is allergic to flexeril [cyclobenzaprine hcl] and vancomycin. Drug: Ms. Ellington  reports current drug use. Drug: Oxycodone. Alcohol:  reports no history of alcohol use. Tobacco:  reports that she quit smoking about 21 years ago. Her smoking use included cigarettes. She has a 50.00 pack-year smoking history. She has quit using smokeless tobacco. Social: Ms. Clendenning  reports that she quit smoking about 21 years ago. Her smoking use included cigarettes. She has a 50.00 pack-year smoking history. She has quit using smokeless tobacco. She reports current drug use. Drug: Oxycodone. She reports that she does not drink alcohol. Medical:  has a past medical history of Acute postoperative pain (02/09/2018), Arrhythmia, sinus node (04/03/2014), Arthritis, Arthritis, Atrial fibrillation ([place]), Back pain, Bradycardia, Breast cancer ([place]) (2004), Cancer ([place]) (2004), CHF (congestive heart failure) ([place]), Chronic back pain, CKD (chronic kidney disease), COPD (chronic obstructive pulmonary disease) ([place]), Cystocele, Decubitus ulcers, Dehydration, Gout, Hematuria, Hepatitis C, Hyperlipidemia, Hypertension, Hypomagnesemia (06/25/2015), OAB (overactive bladder), Overactive bladder, Restless leg, Shoulder pain, bilateral, Urinary frequency, UTI (lower urinary tract infection), and Vaginal atrophy. Surgical: Ms. Achey  has a past surgical history that includes Abdominal hysterectomy; Back surgery; Rotator cuff repair; Breast surgery; Cataract extraction w/PHACO (10/19/2011); Cataract extraction w/PHACO (11/02/2011); Carpal tunnel release; Joint replacement; Colon surgery; Pacemaker insertion (N/A, 12/24/2015); Cholecystectomy; Breast lumpectomy (Right, 2004); and Insert / replace / remove pacemaker. Family: family history includes Breast cancer in her paternal aunt; Breast cancer (age of onset: 22) in her sister; Cancer in her  sister; Heart disease in her father and mother; Kidney disease in her brother; Stroke in her mother.  Laboratory Chemistry Profile   Renal Lab Results  Component Value Date   BUN 26 (H) 05/24/2020   CREATININE 1.32 (H) 05/24/2020   BCR 25 (H) 04/01/2020   GFRAA 42 (L) 05/24/2020   GFRNONAA 36 (L) 05/24/2020     Hepatic Lab Results  Component Value Date   AST 20 05/24/2020   ALT 15 05/24/2020   ALBUMIN 3.0 (L) 05/24/2020   ALKPHOS 52 05/24/2020     Electrolytes Lab Results  Component Value Date   NA 136 05/24/2020   K 3.8 05/24/2020   CL 108 05/24/2020   CALCIUM 8.4 (L) 05/24/2020   MG 2.2 09/24/2015     Bone Lab Results  Component Value Date   VD25OH 31 04/01/2020     Inflammation (CRP: Acute Phase) (ESR: Chronic Phase) Lab Results  Component Value Date   CRP <0.5 09/24/2015   ESRSEDRATE 12 09/24/2015       Note: Above Lab results reviewed.  Recent Imaging Review  DG Chest Port 1 View CLINICAL DATA:  Cough, wheezing  EXAM: PORTABLE CHEST 1 VIEW  COMPARISON:  05/23/2020 chest radiograph.  FINDINGS: Stable configuration of 2 lead left subclavian pacemaker. Surgical clips overlie the right axilla and upper right abdomen. Partially visualized bilateral shoulder arthroplasty. Stable cardiomediastinal  silhouette with mild cardiomegaly. No pneumothorax. No pleural effusion. Scattered reticular and parahilar interstitial opacities throughout both lungs appear unchanged. No superimposed acute consolidative airspace disease.  IMPRESSION: 1. Stable mild cardiomegaly. 2. Stable scattered reticular and parahilar interstitial opacities throughout both lungs, favor nonspecific scarring with a component of mild pulmonary edema not excluded.  Electronically Signed   By: Ilona Sorrel M.D.   On: 05/25/2020 05:36 Note: Reviewed        Physical Exam  General appearance: Well nourished, well developed, and well hydrated. In no apparent acute distress Mental  status: Alert, oriented x 3 (person, place, & time)       Respiratory: No evidence of acute respiratory distress Eyes: PERLA Vitals: BP (!) 72/57 (BP Location: Right Arm, Patient Position: Sitting, Cuff Size: Normal)    Pulse 69    Temp (!) 97.2 F (36.2 C) (Temporal)    Resp 16    Ht '5\' 6"'  (1.676 m)    Wt 157 lb (71.2 kg)    SpO2 97%    BMI 25.34 kg/m  BMI: Estimated body mass index is 25.34 kg/m as calculated from the following:   Height as of this encounter: '5\' 6"'  (1.676 m).   Weight as of this encounter: 157 lb (71.2 kg). Ideal: Ideal body weight: 59.3 kg (130 lb 11.7 oz) Adjusted ideal body weight: 64.1 kg (141 lb 3.8 oz)  Assessment   Status Diagnosis  Controlled Controlled Controlled 1. Chronic pain syndrome   2. Insomnia, unspecified type   3. Opioid-induced constipation (OIC)   4. Restless leg syndrome      Updated Problems: No problems updated.  Plan of Care  Problem-specific:  No problem-specific Assessment & Plan notes found for this encounter.  Ms. MALONE ADMIRE has a current medication list which includes the following long-term medication(s): albuterol, apixaban, furosemide, gabapentin, levocetirizine, benefiber, [START ON 06/26/2020] bisacodyl, [START ON 06/26/2020] melatonin, [START ON 06/26/2020] oxycodone hcl, [START ON 07/26/2020] oxycodone hcl, [START ON 08/25/2020] oxycodone hcl, [START ON 06/26/2020] polyethylene glycol, and [START ON 06/26/2020] ropinirole.  Pharmacotherapy (Medications Ordered): Meds ordered this encounter  Medications   Oxycodone HCl 20 MG TABS    Sig: Take 1 tablet (20 mg total) by mouth every 8 (eight) hours as needed. Must last 30 days    Dispense:  90 tablet    Refill:  0    Chronic Pain: STOP Act (Not applicable) Fill 1 day early if closed on refill date. Avoid benzodiazepines within 8 hours of opioids   Melatonin 10 MG CAPS    Sig: Take 10 mg by mouth at bedtime.    Dispense:  30 capsule    Refill:  2    Fill one day early if  pharmacy is closed on scheduled refill date. May substitute for generic or OTC, if available.   bisacodyl (DULCOLAX) 5 MG EC tablet    Sig: Take 2 tablets (10 mg total) by mouth daily as needed for moderate constipation.    Dispense:  180 tablet    Refill:  0    Fill one day early if pharmacy is closed on scheduled refill date. May substitute for generic if available.   polyethylene glycol (MIRALAX / GLYCOLAX) 17 g packet    Sig: Take 17 g by mouth daily as needed for moderate constipation. Must last 30 days    Dispense:  90 packet    Refill:  0    Fill one day early if pharmacy is closed on scheduled refill  date. May substitute for generic if available.   rOPINIRole (REQUIP) 2 MG tablet    Sig: Take 2 tablets (4 mg total) by mouth at bedtime. Must last 30 days    Dispense:  60 tablet    Refill:  2    Fill one day early if pharmacy is closed on scheduled refill date. May substitute for generic if available.   Oxycodone HCl 20 MG TABS    Sig: Take 1 tablet (20 mg total) by mouth every 8 (eight) hours as needed. Must last 30 days    Dispense:  90 tablet    Refill:  0    Chronic Pain: STOP Act (Not applicable) Fill 1 day early if closed on refill date. Avoid benzodiazepines within 8 hours of opioids   Oxycodone HCl 20 MG TABS    Sig: Take 1 tablet (20 mg total) by mouth every 8 (eight) hours as needed. Must last 30 days    Dispense:  90 tablet    Refill:  0    Chronic Pain: STOP Act (Not applicable) Fill 1 day early if closed on refill date. Avoid benzodiazepines within 8 hours of opioids   Orders:  No orders of the defined types were placed in this encounter.  Follow-up plan:   Return in about 16 weeks (around 09/24/2020) for (F2F), (Med Mgmt).      Interventional management options:  Considering:   NOTE: ELIQUIS ANTICOAGULATION (Stop: x3 days prior to procedure   Restart 6 hours after procedures) Diagnostic right SI joint block Possible bilateral SI joint RFA Diagnostic  bilateral femoral nerve + obturator NB Possible bilateral femoral nerve + obturator nerve RFA Diagnostic right IA shoulder joint injection   Palliative PRN treatment(s):   Diagnostic left SI joint block #2  Palliative left CESI #3  Palliative left trochanteric bursa injection #2  Palliative right IA hip inj. #2  (100% on the right) Palliative left IA hip inj. #3   Palliative right lumbar facet block #3 Palliative left lumbar facet block #5 Palliative right lumbar facet RFA #2 (Last done on 09/19/18) Palliative left lumbar facet RFA #4 (Last done on 08/01/18) Palliative bilateral suprascapular nerve block Palliative bilateral suprascapular nerve RFA #2(Last done 07/13/18)       Recent Visits Date Type Provider Dept  03/17/20 Office Visit Milinda Pointer, MD [facility]  Showing recent visits within past 90 days and meeting all other requirements Today's Visits Date Type Provider Dept  06/04/20 Office Visit Milinda Pointer, MD [facility]  Showing today's visits and meeting all other requirements Future Appointments No visits were found meeting these conditions. Showing future appointments within next 90 days and meeting all other requirements  I discussed the assessment and treatment plan with the patient. The patient was provided an opportunity to ask questions and all were answered. The patient agreed with the plan and demonstrated an understanding of the instructions.  Patient advised to call back or seek an in-person evaluation if the symptoms or condition worsens.  Duration of encounter: 30 minutes.  Note by: Gaspar Cola, MD Date: 06/04/2020; Time: 11:02 AM

## 2020-06-04 ENCOUNTER — Other Ambulatory Visit: Payer: Self-pay

## 2020-06-04 ENCOUNTER — Encounter: Payer: Self-pay | Admitting: Family Medicine

## 2020-06-04 ENCOUNTER — Encounter: Payer: Self-pay | Admitting: Pain Medicine

## 2020-06-04 ENCOUNTER — Ambulatory Visit: Payer: Medicare HMO | Attending: Pain Medicine | Admitting: Pain Medicine

## 2020-06-04 DIAGNOSIS — K5903 Drug induced constipation: Secondary | ICD-10-CM | POA: Diagnosis not present

## 2020-06-04 DIAGNOSIS — G894 Chronic pain syndrome: Secondary | ICD-10-CM | POA: Insufficient documentation

## 2020-06-04 DIAGNOSIS — G2581 Restless legs syndrome: Secondary | ICD-10-CM | POA: Insufficient documentation

## 2020-06-04 DIAGNOSIS — T402X5A Adverse effect of other opioids, initial encounter: Secondary | ICD-10-CM | POA: Insufficient documentation

## 2020-06-04 DIAGNOSIS — G4700 Insomnia, unspecified: Secondary | ICD-10-CM | POA: Insufficient documentation

## 2020-06-04 MED ORDER — OXYCODONE HCL 20 MG PO TABS: 20.0000 mg | ORAL_TABLET | Freq: Three times a day (TID) | ORAL | 0 refills | Status: DC | PRN

## 2020-06-04 MED ORDER — BISACODYL 5 MG PO TBEC: 10.0000 mg | DELAYED_RELEASE_TABLET | Freq: Every day | ORAL | 0 refills | Status: DC | PRN

## 2020-06-04 MED ORDER — MELATONIN 10 MG PO CAPS: 10.0000 mg | ORAL_CAPSULE | Freq: Every day | ORAL | 2 refills | Status: DC

## 2020-06-04 MED ORDER — POLYETHYLENE GLYCOL 3350 17 G PO PACK: 17.0000 g | PACK | Freq: Every day | ORAL | 0 refills | Status: DC | PRN

## 2020-06-04 MED ORDER — ROPINIROLE HCL 2 MG PO TABS: 4.0000 mg | ORAL_TABLET | Freq: Every day | ORAL | 2 refills | Status: DC

## 2020-06-04 NOTE — Patient Instructions (Signed)
____________________________________________________________________________________________  Medication Rules  Purpose: To inform patients, and their family members, of our rules and regulations.  Applies to: All patients receiving prescriptions (written or electronic).  Pharmacy of record: Pharmacy where electronic prescriptions will be sent. If written prescriptions are taken to a different pharmacy, please inform the nursing staff. The pharmacy listed in the electronic medical record should be the one where you would like electronic prescriptions to be sent.  Electronic prescriptions: In compliance with the [place] (STOP) Act of 2017 (Session Lanny Cramp [phone]), effective August 23, 2018, all controlled substances must be electronically prescribed. Calling prescriptions to the pharmacy will cease to exist.  Prescription refills: Only during scheduled appointments. Applies to all prescriptions.  NOTE: The following applies primarily to controlled substances (Opioid* Pain Medications).   Type of encounter (visit): For patients receiving controlled substances, face-to-face visits are required. (Not an option or up to the patient.)  Patient's responsibilities: 1. Pain Pills: Bring all pain pills to every appointment (except for procedure appointments). 2. Pill Bottles: Bring pills in original pharmacy bottle. Always bring the newest bottle. Bring bottle, even if empty. 3. Medication refills: You are responsible for knowing and keeping track of what medications you take and those you need refilled. The day before your appointment: write a list of all prescriptions that need to be refilled. The day of the appointment: give the list to the admitting nurse. Prescriptions will be written only during appointments. No prescriptions will be written on procedure days. If you forget a medication: it will not be "Called in", "Faxed", or "electronically sent".  You will need to get another appointment to get these prescribed. No early refills. Do not call asking to have your prescription filled early. 4. Prescription Accuracy: You are responsible for carefully inspecting your prescriptions before leaving our office. Have the discharge nurse carefully go over each prescription with you, before taking them home. Make sure that your name is accurately spelled, that your address is correct. Check the name and dose of your medication to make sure it is accurate. Check the number of pills, and the written instructions to make sure they are clear and accurate. Make sure that you are given enough medication to last until your next medication refill appointment. 5. Taking Medication: Take medication as prescribed. When it comes to controlled substances, taking less pills or less frequently than prescribed is permitted and encouraged. Never take more pills than instructed. Never take medication more frequently than prescribed.  6. Inform other Doctors: Always inform, all of your healthcare providers, of all the medications you take. 7. Pain Medication from other Providers: You are not allowed to accept any additional pain medication from any other Doctor or Healthcare provider. There are two exceptions to this rule. (see below) In the event that you require additional pain medication, you are responsible for notifying us, as stated below. 8. Medication Agreement: You are responsible for carefully reading and following our Medication Agreement. This must be signed before receiving any prescriptions from our practice. Safely store a copy of your signed Agreement. Violations to the Agreement will result in no further prescriptions. (Additional copies of our Medication Agreement are available upon request.) 9. Laws, Rules, & Regulations: All patients are expected to follow all Federal and [employer], [employer], Rules, [employer] [facility] [employer]. Ignorance of the Laws does not constitute a  valid excuse.  10. Illegal drugs and Controlled Substances: The use of illegal substances (including, but not limited to marijuana and its  derivatives) and/or the illegal use of any controlled substances is strictly prohibited. Violation of this rule may result in the immediate and permanent discontinuation of any and all prescriptions being written by our practice. The use of any illegal substances is prohibited. 11. Adopted CDC guidelines & recommendations: Target dosing levels will be at or below 60 MME/day. Use of benzodiazepines** is not recommended.  Exceptions: There are only two exceptions to the rule of not receiving pain medications from other Healthcare Providers. 1. Exception #1 (Emergencies): In the event of an emergency (i.e.: accident requiring emergency care), you are allowed to receive additional pain medication. However, you are responsible for: As soon as you are able, call our office [phone], at any time of the day or night, and leave a message stating your name, the date and nature of the emergency, and the name and dose of the medication prescribed. In the event that your call is answered by a member of our staff, make sure to document and save the date, time, and the name of the person that took your information.  2. Exception #2 (Planned Surgery): In the event that you are scheduled by another doctor or dentist to have any type of surgery or procedure, you are allowed (for a period no longer than 30 days), to receive additional pain medication, for the acute post-op pain. However, in this case, you are responsible for picking up a copy of our "Post-op Pain Management for Surgeons" handout, and giving it to your surgeon or dentist. This document is available at our office, and does not require an appointment to obtain it. Simply go to our office during business hours (Monday-Thursday from 8:00 AM to 4:00 PM) (Friday 8:00 AM to 12:00 Noon) or if you have a scheduled appointment  with [name], prior to your surgery, and ask for it by name. In addition, you are responsible for: calling our office [phone], at any time of the day or night, and leaving a message stating your name, name of your surgeon, type of surgery, and date of procedure or surgery. Failure to comply with your responsibilities may result in termination of therapy involving the controlled substances.  *Opioid medications include: morphine, codeine, oxycodone, oxymorphone, hydrocodone, hydromorphone, meperidine, tramadol, tapentadol, buprenorphine, fentanyl, methadone. **Benzodiazepine medications include: diazepam (Valium), alprazolam (Xanax), clonazepam (Klonopine), lorazepam (Ativan), clorazepate (Tranxene), chlordiazepoxide (Librium), estazolam (Prosom), oxazepam (Serax), temazepam (Restoril), triazolam (Halcion) (Last updated: 04/29/2020) ____________________________________________________________________________________________   ____________________________________________________________________________________________  Medication Recommendations and Reminders  Applies to: All patients receiving prescriptions (written and/or electronic).  Medication Rules & Regulations: These rules and regulations exist for your safety and that of others. They are not flexible and neither are we. Dismissing or ignoring them will be considered "non-compliance" with medication therapy, resulting in complete and irreversible termination of such therapy. (See document titled "Medication Rules" for more details.) In all conscience, because of safety reasons, we cannot continue providing a therapy where the patient does not follow instructions.  Pharmacy of record:   Definition: This is the pharmacy where your electronic prescriptions will be sent.   We do not endorse any particular pharmacy, however, we have experienced problems with [facility] not securing enough medication supply for the community.  We do not  restrict you in your choice of pharmacy. However, once we write for your prescriptions, we will NOT be re-sending more prescriptions to fix restricted supply problems created by your pharmacy, or your insurance.   The pharmacy listed in the electronic medical record should be the  one where you want electronic prescriptions to be sent.  If you choose to change pharmacy, simply notify our nursing staff.  Recommendations:  Keep all of your pain medications in a safe place, under lock and key, even if you live alone. We will NOT replace lost, stolen, or damaged medication.  After you fill your prescription, take 1 week's worth of pills and put them away in a safe place. You should keep a separate, properly labeled bottle for this purpose. The remainder should be kept in the original bottle. Use this as your primary supply, until it runs out. Once it's gone, then you know that you have 1 week's worth of medicine, and it is time to come in for a prescription refill. If you do this correctly, it is unlikely that you will ever run out of medicine.  To make sure that the above recommendation works, it is very important that you make sure your medication refill appointments are scheduled at least 1 week before you run out of medicine. To do this in an effective manner, make sure that you do not leave the office without scheduling your next medication management appointment. Always ask the nursing staff to show you in your prescription , when your medication will be running out. Then arrange for the receptionist to get you a return appointment, at least 7 days before you run out of medicine. Do not wait until you have 1 or 2 pills left, to come in. This is very poor planning and does not take into consideration that we may need to cancel appointments due to bad weather, sickness, or emergencies affecting our staff.  DO NOT ACCEPT A "Partial Fill": If for any reason your pharmacy does not have enough pills/tablets  to completely fill or refill your prescription, do not allow for a "partial fill". The law allows the pharmacy to complete that prescription within 72 hours, without requiring a new prescription. If they do not fill the rest of your prescription within those 72 hours, you will need a separate prescription to fill the remaining amount, which we will NOT provide. If the reason for the partial fill is your insurance, you will need to talk to the pharmacist about payment alternatives for the remaining tablets, but again, DO NOT ACCEPT A PARTIAL FILL, unless you can trust your pharmacist to obtain the remainder of the pills within 72 hours.  Prescription refills and/or changes in medication(s):   Prescription refills, and/or changes in dose or medication, will be conducted only during scheduled medication management appointments. (Applies to both, written and electronic prescriptions.)  No refills on procedure days. No medication will be changed or started on procedure days. No changes, adjustments, and/or refills will be conducted on a procedure day. Doing so will interfere with the diagnostic portion of the procedure.  No phone refills. No medications will be "called into the pharmacy".  No Fax refills.  No weekend refills.  No Holliday refills.  No after hours refills.  Remember:  Business hours are:  Monday to Thursday 8:00 AM to 4:00 PM Provider's Schedule: Milinda Pointer, MD - Appointments are:  Medication management: Monday and Wednesday 8:00 AM to 4:00 PM Procedure day: Tuesday and Thursday 7:30 AM to 4:00 PM Gillis Santa, MD - Appointments are:  Medication management: Tuesday and Thursday 8:00 AM to 4:00 PM Procedure day: Monday and Wednesday 7:30 AM to 4:00 PM (Last update: 03/12/2020) ____________________________________________________________________________________________  ____________________________________________________________________________________________  CBD  (cannabidiol) WARNING  Applicable to: All individuals currently taking  or considering taking CBD (cannabidiol) and, more important, all patients taking opioid analgesic controlled substances (pain medication). (Example: oxycodone; oxymorphone; hydrocodone; hydromorphone; morphine; methadone; tramadol; tapentadol; fentanyl; buprenorphine; butorphanol; dextromethorphan; meperidine; codeine; etc.)  Legal status: CBD remains a Schedule I drug prohibited for any use. CBD is illegal with one exception. In the [place], CBD has a limited Transport planner (FDA) approval for the treatment of two specific types of epilepsy disorders. Only one CBD product has been approved by the FDA for this purpose: "Epidiolex". FDA is aware that some companies are marketing products containing cannabis and cannabis-derived compounds in ways that violate the [employer], Drug and Cosmetic Act [facility] Act) and that may put the health and safety of consumers at risk. The FDA, a Federal agency, has not enforced the CBD status since 2018.   Legality: Some manufacturers ship CBD products nationally, which is illegal. Often such products are sold online and are therefore available throughout the country. CBD is openly sold in head shops and health food stores in some states where such sales have not been explicitly legalized. Selling unapproved products with unsubstantiated therapeutic claims is not only a violation of the law, but also can put patients at risk, as these products have not been proven to be safe or effective. Federal illegality makes it difficult to conduct research on CBD.  Reference: "FDA Regulation of Cannabis and Cannabis-Derived Products, Including Cannabidiol (CBD)" - [employer]  Warning: CBD is not FDA approved and has not undergo the same manufacturing controls as prescription  drugs.  This means that the purity and safety of available CBD may be questionable. Most of the time, despite manufacturer's claims, it is contaminated with THC (delta-9-tetrahydrocannabinol - the chemical in marijuana responsible for the "HIGH").  When this is the case, the [facility] contaminant will trigger a positive urine drug screen (UDS) test for Marijuana (carboxy-THC). Because a positive UDS for any illicit substance is a violation of our medication agreement, your opioid analgesics (pain medicine) may be permanently discontinued.  MORE ABOUT CBD  General Information: CBD  is a derivative of the Marijuana (cannabis sativa) plant discovered in 48. It is one of the 113 identified substances found in Marijuana. It accounts for up to 40% of the plant's extract. As of 2018, preliminary clinical studies on CBD included research for the treatment of anxiety, movement disorders, and pain. CBD is available and consumed in multiple forms, including inhalation of smoke or vapor, as an aerosol spray, and by mouth. It may be supplied as an oil containing CBD, capsules, dried cannabis, or as a liquid solution. CBD is thought not to be as psychoactive as THC (delta-9-tetrahydrocannabinol - the chemical in marijuana responsible for the "HIGH"). Studies suggest that CBD may interact with different biological target receptors in the body, including cannabinoid and other neurotransmitter receptors. As of 2018 the mechanism of action for its biological effects has not been determined.  Side-effects  Adverse reactions: Dry mouth, diarrhea, decreased appetite, fatigue, drowsiness, malaise, weakness, sleep disturbances, and others.  Drug interactions: CBC may interact with other medications such as blood-thinners. (Last update:  03/29/2020) ____________________________________________________________________________________________   ____________________________________________________________________________________________  Drug Holidays (Slow)  What is a "Drug Holiday"? Drug Holiday: is the name given to the period of time during which a patient stops taking a medication(s) for the purpose of eliminating tolerance to the drug.  Benefits . Improved effectiveness of opioids. . Decreased opioid dose needed to achieve benefits. . Improved pain with lesser dose.  What  is tolerance? Tolerance: is the progressive decreased in effectiveness of a drug due to its repetitive use. With repetitive use, the body gets use to the medication and as a consequence, it loses its effectiveness. This is a common problem seen with opioid pain medications. As a result, a larger dose of the drug is needed to achieve the same effect that used to be obtained with a smaller dose.  How long should a "Drug Holiday" last? You should stay off of the pain medicine for at least 14 consecutive days. (2 weeks)  Should I stop the medicine "cold [place]"? No. You should always coordinate with your Pain Specialist so that he/she can provide you with the correct medication dose to make the transition as smoothly as possible.  How do I stop the medicine? Slowly. You will be instructed to decrease the daily amount of pills that you take by one (1) pill every seven (7) days. This is called a "slow downward taper" of your dose. For example: if you normally take four (4) pills per day, you will be asked to drop this dose to three (3) pills per day for seven (7) days, then to two (2) pills per day for seven (7) days, then to one (1) per day for seven (7) days, and at the end of those last seven (7) days, this is when the "Drug Holiday" would start.   Will I have withdrawals? By doing a "slow downward taper" like this one, it is unlikely that you will  experience any significant withdrawal symptoms. Typically, what triggers withdrawals is the sudden stop of a high dose opioid therapy. Withdrawals can usually be avoided by slowly decreasing the dose over a prolonged period of time. If you do not follow these instructions and decide to stop your medication abruptly, withdrawals may be possible.  What are withdrawals? Withdrawals: refers to the wide range of symptoms that occur after stopping or dramatically reducing opiate drugs after heavy and prolonged use. Withdrawal symptoms do not occur to patients that use low dose opioids, or those who take the medication sporadically. Contrary to benzodiazepine (example: Valium, Xanax, etc.) or alcohol withdrawals ("Delirium Tremens"), opioid withdrawals are not lethal. Withdrawals are the physical manifestation of the body getting rid of the excess receptors.  Expected Symptoms Early symptoms of withdrawal may include: . Agitation . Anxiety . Muscle aches . Increased tearing . Insomnia . Runny nose . Sweating . Yawning  Late symptoms of withdrawal may include: . Abdominal cramping . Diarrhea . Dilated pupils . Goose bumps . Nausea . Vomiting  Will I experience withdrawals? Due to the slow nature of the taper, it is very unlikely that you will experience any.  What is a slow taper? Taper: refers to the gradual decrease in dose.  (Last update: 03/12/2020) ____________________________________________________________________________________________

## 2020-06-04 NOTE — Progress Notes (Signed)
Nursing Pain Medication Assessment:  Safety precautions to be maintained throughout the outpatient stay will include: orient to surroundings, keep bed in low position, maintain call bell within reach at all times, provide assistance with transfer out of bed and ambulation.  Medication Inspection Compliance: Pill count conducted under aseptic conditions, in front of the patient. Neither the pills nor the bottle was removed from the patient's sight at any time. Once count was completed pills were immediately returned to the patient in their original bottle.  Medication: Oxycodone IR Pill/Patch Count: 74 of 90 pills remain Pill/Patch Appearance: Markings consistent with prescribed medication Bottle Appearance: Standard pharmacy container. Clearly labeled. Filled Date: 10 / 05 / 2021 Last Medication intake:  Today

## 2020-06-05 DIAGNOSIS — I872 Venous insufficiency (chronic) (peripheral): Secondary | ICD-10-CM | POA: Diagnosis not present

## 2020-06-05 DIAGNOSIS — N189 Chronic kidney disease, unspecified: Secondary | ICD-10-CM | POA: Diagnosis not present

## 2020-06-05 DIAGNOSIS — J9621 Acute and chronic respiratory failure with hypoxia: Secondary | ICD-10-CM | POA: Diagnosis not present

## 2020-06-05 DIAGNOSIS — L97829 Non-pressure chronic ulcer of other part of left lower leg with unspecified severity: Secondary | ICD-10-CM | POA: Diagnosis not present

## 2020-06-05 DIAGNOSIS — I509 Heart failure, unspecified: Secondary | ICD-10-CM | POA: Diagnosis not present

## 2020-06-05 DIAGNOSIS — J189 Pneumonia, unspecified organism: Secondary | ICD-10-CM | POA: Diagnosis not present

## 2020-06-05 DIAGNOSIS — J440 Chronic obstructive pulmonary disease with acute lower respiratory infection: Secondary | ICD-10-CM | POA: Diagnosis not present

## 2020-06-05 DIAGNOSIS — M1030 Gout due to renal impairment, unspecified site: Secondary | ICD-10-CM | POA: Diagnosis not present

## 2020-06-05 DIAGNOSIS — I130 Hypertensive heart and chronic kidney disease with heart failure and stage 1 through stage 4 chronic kidney disease, or unspecified chronic kidney disease: Secondary | ICD-10-CM | POA: Diagnosis not present

## 2020-06-06 ENCOUNTER — Telehealth: Payer: Self-pay | Admitting: Family Medicine

## 2020-06-06 ENCOUNTER — Other Ambulatory Visit: Payer: Self-pay

## 2020-06-06 ENCOUNTER — Ambulatory Visit (INDEPENDENT_AMBULATORY_CARE_PROVIDER_SITE_OTHER): Payer: Medicare HMO | Admitting: Nurse Practitioner

## 2020-06-06 ENCOUNTER — Other Ambulatory Visit: Payer: Medicare HMO

## 2020-06-06 VITALS — BP 104/92 | HR 84 | Ht 66.0 in | Wt 154.0 lb

## 2020-06-06 DIAGNOSIS — E785 Hyperlipidemia, unspecified: Secondary | ICD-10-CM

## 2020-06-06 DIAGNOSIS — I83892 Varicose veins of left lower extremities with other complications: Secondary | ICD-10-CM

## 2020-06-06 DIAGNOSIS — Z7901 Long term (current) use of anticoagulants: Secondary | ICD-10-CM

## 2020-06-06 DIAGNOSIS — I129 Hypertensive chronic kidney disease with stage 1 through stage 4 chronic kidney disease, or unspecified chronic kidney disease: Secondary | ICD-10-CM | POA: Diagnosis not present

## 2020-06-06 DIAGNOSIS — J432 Centrilobular emphysema: Secondary | ICD-10-CM | POA: Diagnosis not present

## 2020-06-06 DIAGNOSIS — N183 Chronic kidney disease, stage 3 unspecified: Secondary | ICD-10-CM | POA: Diagnosis not present

## 2020-06-06 NOTE — Telephone Encounter (Signed)
Called to request verbal orders to move patients OT appt. To begin the week of 10/25.  Patient has a lot of appts. Today and is not able to see OT today.  Questions please call at [phone]

## 2020-06-07 LAB — COMPLETE METABOLIC PANEL WITHOUT GFR
AG Ratio: 1.3 (calc) (ref 1.0–2.5)
ALT: 16 U/L (ref 6–29)
AST: 28 U/L (ref 10–35)
Albumin: 3.5 g/dL — ABNORMAL LOW (ref 3.6–5.1)
Alkaline phosphatase (APISO): 50 U/L (ref 37–153)
BUN/Creatinine Ratio: 18 (calc) (ref 6–22)
BUN: 33 mg/dL — ABNORMAL HIGH (ref 7–25)
CO2: 18 mmol/L — ABNORMAL LOW (ref 20–32)
Calcium: 8.7 mg/dL (ref 8.6–10.4)
Chloride: 109 mmol/L (ref 98–110)
Creat: 1.86 mg/dL — ABNORMAL HIGH (ref 0.60–0.88)
GFR, Est African American: 28 mL/min/1.73m2 — ABNORMAL LOW (ref >=60)
GFR, Est Non African American: 24 mL/min/1.73m2 — ABNORMAL LOW (ref >=60)
Globulin: 2.6 g/dL (ref 1.9–3.7)
Glucose, Bld: 77 mg/dL (ref 65–99)
Potassium: 3.8 mmol/L (ref 3.5–5.3)
Sodium: 139 mmol/L (ref 135–146)
Total Bilirubin: 0.4 mg/dL (ref 0.2–1.2)
Total Protein: 6.1 g/dL (ref 6.1–8.1)

## 2020-06-07 LAB — CBC WITH DIFFERENTIAL/PLATELET
Absolute Monocytes: 385 cells/uL (ref 200–950)
Basophils Absolute: 22 cells/uL (ref 0–200)
Basophils Relative: 0.4 %
Eosinophils Absolute: 33 cells/uL (ref 15–500)
Eosinophils Relative: 0.6 %
HCT: 37.4 % (ref 35.0–45.0)
Hemoglobin: 11.8 g/dL (ref 11.7–15.5)
Lymphs Abs: 1986 cells/uL (ref 850–3900)
MCH: 30 pg (ref 27.0–33.0)
MCHC: 31.6 g/dL — ABNORMAL LOW (ref 32.0–36.0)
MCV: 95.2 fL (ref 80.0–100.0)
MPV: 9.9 fL (ref 7.5–12.5)
Monocytes Relative: 7 %
Neutro Abs: 3075 cells/uL (ref 1500–7800)
Neutrophils Relative %: 55.9 %
Platelets: 172 10*3/uL (ref 140–400)
RBC: 3.93 10*6/uL (ref 3.80–5.10)
RDW: 12.8 % (ref 11.0–15.0)
Total Lymphocyte: 36.1 %
WBC: 5.5 10*3/uL (ref 3.8–10.8)

## 2020-06-09 ENCOUNTER — Encounter (INDEPENDENT_AMBULATORY_CARE_PROVIDER_SITE_OTHER): Payer: Self-pay | Admitting: Nurse Practitioner

## 2020-06-09 ENCOUNTER — Telehealth: Payer: Self-pay

## 2020-06-09 DIAGNOSIS — M1030 Gout due to renal impairment, unspecified site: Secondary | ICD-10-CM | POA: Diagnosis not present

## 2020-06-09 DIAGNOSIS — I872 Venous insufficiency (chronic) (peripheral): Secondary | ICD-10-CM | POA: Diagnosis not present

## 2020-06-09 DIAGNOSIS — I130 Hypertensive heart and chronic kidney disease with heart failure and stage 1 through stage 4 chronic kidney disease, or unspecified chronic kidney disease: Secondary | ICD-10-CM | POA: Diagnosis not present

## 2020-06-09 DIAGNOSIS — I509 Heart failure, unspecified: Secondary | ICD-10-CM | POA: Diagnosis not present

## 2020-06-09 DIAGNOSIS — J440 Chronic obstructive pulmonary disease with acute lower respiratory infection: Secondary | ICD-10-CM | POA: Diagnosis not present

## 2020-06-09 DIAGNOSIS — L97829 Non-pressure chronic ulcer of other part of left lower leg with unspecified severity: Secondary | ICD-10-CM | POA: Diagnosis not present

## 2020-06-09 DIAGNOSIS — J189 Pneumonia, unspecified organism: Secondary | ICD-10-CM | POA: Diagnosis not present

## 2020-06-09 DIAGNOSIS — J9621 Acute and chronic respiratory failure with hypoxia: Secondary | ICD-10-CM | POA: Diagnosis not present

## 2020-06-09 DIAGNOSIS — N189 Chronic kidney disease, unspecified: Secondary | ICD-10-CM | POA: Diagnosis not present

## 2020-06-09 NOTE — Telephone Encounter (Signed)
Verbal given 

## 2020-06-09 NOTE — Telephone Encounter (Signed)
Copied from [place] [id number]. Topic: General - Inquiry >> Jun 09, 2020 10:54 AM Gillis Ends D wrote: Reason for CRM: Patient's son called and wanted a follow up with the portable oxygen order that was put in for his mother at [place]. He would like for you to give his mother a call at [phone]. Please advise

## 2020-06-09 NOTE — Progress Notes (Signed)
Subjective:    [name], female    DOB: 06/13/1930, 86 y.o.   MRN: [id number] Chief Complaint  Patient presents with  . Follow-up    4 week unna boot check    The patient presents today on referral by Dr. Parks Ranger in regards to a slow healing venous ulceration.  The patient notes that this wound occurred several months ago following a bleeding episode.  The patient notes that since her previous office visit she has not had any further episodes of bleeding however the ulceration has not healed very well.  The patient has been in [employer].  The patient has also been recently hospitalized for pneumonia as well.  She currently denies any fever, chills, nausea, vomiting or diarrhea.  She has been doing well with the transition to medical grade 1 compression stockings.  Previous noninvasive study showed no evidence of DVT or superficial venous thrombosis.  No evidence of deep venous insufficiency or superficial venous reflux.   Review of Systems  Cardiovascular: Positive for leg swelling.  Musculoskeletal: Positive for arthralgias and joint swelling.  Hematological: Bruises/bleeds easily.  All other systems reviewed and are negative.      Objective:   Physical Exam Vitals reviewed.  HENT:     Head: Normocephalic.  Cardiovascular:     Rate and Rhythm: Normal rate.     Pulses: Normal pulses.  Pulmonary:     Effort: Pulmonary effort is normal.  Neurological:     Mental Status: She is alert and oriented to person, place, and time. Mental status is at baseline.     Motor: Weakness present.     Gait: Gait abnormal.  Psychiatric:        Mood and Affect: Mood normal.        Behavior: Behavior normal.        Thought Content: Thought content normal.        Judgment: Judgment normal.     BP (!) 104/92   Pulse 84   Ht 5\' 6"  (1.676 m)   Wt 154 lb (69.9 kg)   BMI 24.86 kg/m   Past Medical History:  Diagnosis Date  . Acute postoperative pain 02/09/2018  .  Arrhythmia, sinus node 04/03/2014  . Arthritis   . Arthritis   . Atrial fibrillation ([place])   . Back pain    lower back chronic  . Bradycardia   . Breast cancer ([place]) 2004   right breast cancer  . Cancer [facility]) 2004   rt breast cancer-post lumpectomy- chemo/rad  . CHF (congestive heart failure) ([place])   . Chronic back pain   . CKD (chronic kidney disease)   . COPD (chronic obstructive pulmonary disease) (H[facility])    wears O2 at 2L via [place] at night  . Cystocele   . Decubitus ulcers   . Dehydration   . Gout   . Hematuria   . Hepatitis C   . Hyperlipidemia   . Hypertension   . Hypomagnesemia 06/25/2015  . OAB (overactive bladder)   . Overactive bladder   . Restless leg   . Shoulder pain, bilateral   . Urinary frequency   . UTI (lower urinary tract infection)   . Vaginal atrophy     Social History   Socioeconomic History  . Marital status: Single    Spouse name: Not on file  . Number of children: 2  . Years of education: 64  . Highest education level: 12th grade  Occupational History  .  Occupation: retired  Tobacco Use  . Smoking status: Former Smoker    Packs/day: 1.25    Years: 40.00    Pack years: 50.00    Types: Cigarettes    Quit date: 12/16/1998    Years since quitting: 21.4  . Smokeless tobacco: Former Network engineer  . Vaping Use: Never used  Substance and Sexual Activity  . Alcohol use: No    Alcohol/week: 0.0 standard drinks  . Drug use: Yes    Types: Oxycodone  . Sexual activity: Not Currently    Birth control/protection: Surgical  Other Topics Concern  . Not on file  Social History Narrative  . Not on file   Social Determinants of Health   Financial Resource Strain: Low Risk   . Difficulty of Paying Living Expenses: Not hard at all  Food Insecurity: No Food Insecurity  . Worried About Charity fundraiser in the Last Year: Never true  . Ran Out of Food in the Last Year: Never true  Transportation Needs: No Transportation Needs  . Lack of  Transportation (Medical): No  . Lack of Transportation (Non-Medical): No  Physical Activity: Inactive  . Days of Exercise per Week: 0 days  . Minutes of Exercise per Session: 0 min  Stress: No Stress Concern Present  . Feeling of Stress : Not at all  Social Connections:   . Frequency of Communication with Friends and Family: Not on file  . Frequency of Social Gatherings with Friends and Family: Not on file  . Attends Religious Services: Not on file  . Active Member of Clubs or Organizations: Not on file  . Attends Archivist Meetings: Not on file  . Marital Status: Not on file  Intimate Partner Violence:   . Fear of Current or Ex-Partner: Not on file  . Emotionally Abused: Not on file  . Physically Abused: Not on file  . Sexually Abused: Not on file    Past Surgical History:  Procedure Laterality Date  . ABDOMINAL HYSTERECTOMY    . BACK SURGERY    . BREAST LUMPECTOMY Right 2004  . BREAST SURGERY     rt lumpectomy  . CARPAL TUNNEL RELEASE    . CATARACT EXTRACTION W/PHACO  10/19/2011   Procedure: CATARACT EXTRACTION PHACO AND INTRAOCULAR LENS PLACEMENT (IOC);  Surgeon: Elta Guadeloupe T. Gershon Crane, MD;  Location: [facility];  Service: Ophthalmology;  Laterality: Right;  CDE:10.81  . CATARACT EXTRACTION W/PHACO  11/02/2011   Procedure: CATARACT EXTRACTION PHACO AND INTRAOCULAR LENS PLACEMENT (IOC);  Surgeon: Elta Guadeloupe T. Gershon Crane, MD;  Location: [facility];  Service: Ophthalmology;  Laterality: Left;  CDE 8.60  . CHOLECYSTECTOMY     3/18  . COLON SURGERY    . INSERT / REPLACE / REMOVE PACEMAKER    . JOINT REPLACEMENT     bilateral TKA  . PACEMAKER INSERTION N/A 12/24/2015   Procedure: INSERTION PACEMAKER;  Surgeon: Isaias Cowman, MD;  Location: [facility] OR[facility];  Service: Cardiovascular;  Laterality: N/A;  . ROTATOR CUFF REPAIR     bilateral    Family History  Problem Relation Age of Onset  . Kidney disease Brother   . Heart disease Father   . Heart disease Mother   . Stroke Mother   .  Cancer Sister   . Breast cancer Sister 41  . Breast cancer Paternal Aunt   . Anesthesia problems Neg Hx   . Hypotension Neg Hx   . Malignant hyperthermia Neg Hx   . Pseudochol deficiency Neg Hx  Allergies  Allergen Reactions  . Flexeril [Cyclobenzaprine Hcl]     Severe Rash  . Vancomycin Rash    [place] Syndrome       Assessment & Plan:   1. Bleeding from varicose veins of lower extremity, left Recommend:  The patient has continued to her to conservative therapy however despite conservative therapy the patient still has risk of bleeding varicosities.  Patient should undergo injection sclerotherapy to treat the residual varicosities in the right lower extremity.  The risks, benefits and alternative therapies were reviewed in detail with the patient.  All questions were answered.  The patient agrees to proceed with sclerotherapy at their convenience.  The patient will continue wearing the graduated compression stockings and using the over-the-counter pain medications to treat her symptoms.       2. Hyperlipidemia, unspecified hyperlipidemia type Continue statin as ordered and reviewed, no changes at this time   3. Chronic anticoagulation (Eliquis) This increases likelihood of spontaneous bleeding from varicose veins in addition to creating increased difficulty with stopping that bleeding.   Current Outpatient Medications on File Prior to Visit  Medication Sig Dispense Refill  . acetaminophen (TYLENOL) 500 MG tablet Take 500 mg by mouth 3 (three) times daily.    Marland Kitchen albuterol (PROVENTIL) (2.5 MG/3ML) 0.083% nebulizer solution Take 3 mLs (2.5 mg total) by nebulization every 8 (eight) hours as needed for wheezing or shortness of breath. 75 mL 3  . apixaban (ELIQUIS) 5 MG TABS tablet Take 1 tablet (5 mg total) by mouth 2 (two) times daily. 60 tablet 0  . [START ON 06/26/2020] bisacodyl (DULCOLAX) 5 MG EC tablet Take 2 tablets (10 mg total) by mouth daily as needed for  moderate constipation. 180 tablet 0  . Calcium Carbonate-Vitamin D 600-200 MG-UNIT TABS Take by mouth.    . Cholecalciferol (VITAMIN D) 2000 units tablet Take 2,000 Units by mouth daily.     Marland Kitchen dextromethorphan-guaiFENesin (MUCINEX DM) 30-600 MG 12hr tablet Take 1 tablet by mouth 2 (two) times daily. 30 tablet 0  . furosemide (LASIX) 20 MG tablet TAKE (1/2) TABLET BY MOUTH ONCE DAILY. 45 tablet 11  . gabapentin (NEURONTIN) 100 MG capsule TAKE 1 CAPSULE BY MOUTH ONCE DAILY THEN INCREASE EVERY 2 TO 3 DAYS UP TO TAKING THREE TIMES DAILY OR 3 CAPSULES AT BEDTIME (Patient taking differently: TAKE 1 CAPSULE BY MOUTH ONCE DAILY THEN INCREASE EVERY 2 TO 3 DAYS UP TO TAKING THREE TIMES DAILY OR 3 CAPSULES AT BEDTIME  Patient reports she is taking 3 capsules at bedtime.) 90 capsule 2  . levocetirizine (XYZAL) 5 MG tablet Take 0.5 tablets (2.5 mg total) by mouth every other day. 23 tablet 4  . [START ON 06/26/2020] Melatonin 10 MG CAPS Take 10 mg by mouth at bedtime. 30 capsule 2  . mupirocin ointment (BACTROBAN) 2 % Apply 1 application topically 2 (two) times daily as needed. For up to 7-10 days as needed to help heal and prevent infection. 22 g 2  . [START ON 06/26/2020] Oxycodone HCl 20 MG TABS Take 1 tablet (20 mg total) by mouth every 8 (eight) hours as needed. Must last 30 days 90 tablet 0  . [START ON 07/26/2020] Oxycodone HCl 20 MG TABS Take 1 tablet (20 mg total) by mouth every 8 (eight) hours as needed. Must last 30 days 90 tablet 0  . [START ON 08/25/2020] Oxycodone HCl 20 MG TABS Take 1 tablet (20 mg total) by mouth every 8 (eight) hours as needed. Must last  30 days 90 tablet 0  . OXYGEN Inhale 2 L into the lungs at bedtime.     Derrill Memo ON 06/26/2020] polyethylene glycol (MIRALAX / GLYCOLAX) 17 g packet Take 17 g by mouth daily as needed for moderate constipation. Must last 30 days 90 packet 0  . [START ON 06/26/2020] rOPINIRole (REQUIP) 2 MG tablet Take 2 tablets (4 mg total) by mouth at bedtime. Must  last 30 days 60 tablet 2  . Wheat Dextrin (BENEFIBER) POWD Take 6 g by mouth 3 (three) times daily before meals. (2 tsp = 6 g) 730 g 8   No current facility-administered medications on file prior to visit.    There are no Patient Instructions on file for this visit. No follow-ups on file.   Kris Hartmann, NP

## 2020-06-10 DIAGNOSIS — J45998 Other asthma: Secondary | ICD-10-CM | POA: Diagnosis not present

## 2020-06-10 DIAGNOSIS — U071 COVID-19: Secondary | ICD-10-CM | POA: Diagnosis not present

## 2020-06-10 DIAGNOSIS — Z20828 Contact with and (suspected) exposure to other viral communicable diseases: Secondary | ICD-10-CM | POA: Diagnosis not present

## 2020-06-10 DIAGNOSIS — Z03818 Encounter for observation for suspected exposure to other biological agents ruled out: Secondary | ICD-10-CM | POA: Diagnosis not present

## 2020-06-11 DIAGNOSIS — I509 Heart failure, unspecified: Secondary | ICD-10-CM | POA: Diagnosis not present

## 2020-06-11 DIAGNOSIS — J9621 Acute and chronic respiratory failure with hypoxia: Secondary | ICD-10-CM | POA: Diagnosis not present

## 2020-06-11 DIAGNOSIS — I872 Venous insufficiency (chronic) (peripheral): Secondary | ICD-10-CM | POA: Diagnosis not present

## 2020-06-11 DIAGNOSIS — L97829 Non-pressure chronic ulcer of other part of left lower leg with unspecified severity: Secondary | ICD-10-CM | POA: Diagnosis not present

## 2020-06-11 DIAGNOSIS — N189 Chronic kidney disease, unspecified: Secondary | ICD-10-CM | POA: Diagnosis not present

## 2020-06-11 DIAGNOSIS — J440 Chronic obstructive pulmonary disease with acute lower respiratory infection: Secondary | ICD-10-CM | POA: Diagnosis not present

## 2020-06-11 DIAGNOSIS — M1030 Gout due to renal impairment, unspecified site: Secondary | ICD-10-CM | POA: Diagnosis not present

## 2020-06-11 DIAGNOSIS — U071 COVID-19: Secondary | ICD-10-CM

## 2020-06-11 DIAGNOSIS — J189 Pneumonia, unspecified organism: Secondary | ICD-10-CM | POA: Diagnosis not present

## 2020-06-11 DIAGNOSIS — I130 Hypertensive heart and chronic kidney disease with heart failure and stage 1 through stage 4 chronic kidney disease, or unspecified chronic kidney disease: Secondary | ICD-10-CM | POA: Diagnosis not present

## 2020-06-11 DIAGNOSIS — I495 Sick sinus syndrome: Secondary | ICD-10-CM | POA: Diagnosis not present

## 2020-06-11 HISTORY — DX: COVID-19: U07.1

## 2020-06-11 NOTE — Telephone Encounter (Signed)
Called her granddaughter Kristin [name] back as requested  [employer] should be sending her portable oxygen concentrator tomorrow 8AM. Desat 70% if off oxygen. On oxygen she is between 93-97%. She has home oxygen just not portable. It was ordered back on 06/03/20.  She already scheduled patient for infusion therapy at [facility] tomorrow morning.  I advised to proceed with this plan, I do not have availability tomorrow, so she should proceed.  F/u as needed, should do virtual visit if new or worsening concern  Kristin [name], [facility] 06/11/2020, 5:17 PM

## 2020-06-11 NOTE — Telephone Encounter (Signed)
Copied from [place] [phone]. Topic: General - Other >> Jun 11, 2020 10:58 AM Celene Kras wrote: Reason for CRM: Pts granddaughter, [name], called stating that the pt tested positive for covid  on 06/10/20. She is requesting to have PCPs advice regarding the covid infusion for pt. Please advise. >> Jun 11, 2020  3:07 PM Keene Breath wrote: Patient's granddaughter is calling again to get a response from the doctor regarding the infusion process before she is scheduled for tomorrow morning.  Please call patient's relative to discuss.

## 2020-06-12 DIAGNOSIS — I872 Venous insufficiency (chronic) (peripheral): Secondary | ICD-10-CM | POA: Diagnosis not present

## 2020-06-12 DIAGNOSIS — M1030 Gout due to renal impairment, unspecified site: Secondary | ICD-10-CM | POA: Diagnosis not present

## 2020-06-12 DIAGNOSIS — I509 Heart failure, unspecified: Secondary | ICD-10-CM | POA: Diagnosis not present

## 2020-06-12 DIAGNOSIS — J440 Chronic obstructive pulmonary disease with acute lower respiratory infection: Secondary | ICD-10-CM | POA: Diagnosis not present

## 2020-06-12 DIAGNOSIS — J9621 Acute and chronic respiratory failure with hypoxia: Secondary | ICD-10-CM | POA: Diagnosis not present

## 2020-06-12 DIAGNOSIS — J189 Pneumonia, unspecified organism: Secondary | ICD-10-CM | POA: Diagnosis not present

## 2020-06-12 DIAGNOSIS — N189 Chronic kidney disease, unspecified: Secondary | ICD-10-CM | POA: Diagnosis not present

## 2020-06-12 DIAGNOSIS — L97829 Non-pressure chronic ulcer of other part of left lower leg with unspecified severity: Secondary | ICD-10-CM | POA: Diagnosis not present

## 2020-06-12 DIAGNOSIS — J45998 Other asthma: Secondary | ICD-10-CM | POA: Diagnosis not present

## 2020-06-12 DIAGNOSIS — I130 Hypertensive heart and chronic kidney disease with heart failure and stage 1 through stage 4 chronic kidney disease, or unspecified chronic kidney disease: Secondary | ICD-10-CM | POA: Diagnosis not present

## 2020-06-13 ENCOUNTER — Ambulatory Visit: Payer: Self-pay | Admitting: Pharmacist

## 2020-06-13 DIAGNOSIS — J432 Centrilobular emphysema: Secondary | ICD-10-CM

## 2020-06-13 NOTE — Chronic Care Management (AMB) (Signed)
  Chronic Care Management   Note  06/13/2020 Name: Kristin [name] MRN: [id number] DOB: February 05, 1930  Speak with patient briefly while reaching out to family member. [name] reports that she tested positive for COVID-19 this week  From review of chart, note patient seen by PCP on 10/12 and advised to be tested for COVID-19 From patient/review of chart, note patient received COVID-19 monoclonal antibody infusion on 10/21  Patient reports using oxygen as directed by PCP. Reports believes symptoms improving this week Reports monitoring pulse ox at home. Reports reading today is 96%  Advise patient to contact providers/seek care if new or worsening symptoms. Will update PCP  Follow up plan: Next PCP appointment scheduled for: 09/03/2020  Harlow Asa, PharmD, [facility] [phone]

## 2020-06-16 DIAGNOSIS — J189 Pneumonia, unspecified organism: Secondary | ICD-10-CM | POA: Diagnosis not present

## 2020-06-16 DIAGNOSIS — M1030 Gout due to renal impairment, unspecified site: Secondary | ICD-10-CM | POA: Diagnosis not present

## 2020-06-16 DIAGNOSIS — L97829 Non-pressure chronic ulcer of other part of left lower leg with unspecified severity: Secondary | ICD-10-CM | POA: Diagnosis not present

## 2020-06-16 DIAGNOSIS — N189 Chronic kidney disease, unspecified: Secondary | ICD-10-CM | POA: Diagnosis not present

## 2020-06-16 DIAGNOSIS — J9621 Acute and chronic respiratory failure with hypoxia: Secondary | ICD-10-CM | POA: Diagnosis not present

## 2020-06-16 DIAGNOSIS — I130 Hypertensive heart and chronic kidney disease with heart failure and stage 1 through stage 4 chronic kidney disease, or unspecified chronic kidney disease: Secondary | ICD-10-CM | POA: Diagnosis not present

## 2020-06-16 DIAGNOSIS — J440 Chronic obstructive pulmonary disease with acute lower respiratory infection: Secondary | ICD-10-CM | POA: Diagnosis not present

## 2020-06-16 DIAGNOSIS — I872 Venous insufficiency (chronic) (peripheral): Secondary | ICD-10-CM | POA: Diagnosis not present

## 2020-06-16 DIAGNOSIS — I509 Heart failure, unspecified: Secondary | ICD-10-CM | POA: Diagnosis not present

## 2020-06-18 DIAGNOSIS — J189 Pneumonia, unspecified organism: Secondary | ICD-10-CM | POA: Diagnosis not present

## 2020-06-18 DIAGNOSIS — I872 Venous insufficiency (chronic) (peripheral): Secondary | ICD-10-CM | POA: Diagnosis not present

## 2020-06-18 DIAGNOSIS — L97829 Non-pressure chronic ulcer of other part of left lower leg with unspecified severity: Secondary | ICD-10-CM | POA: Diagnosis not present

## 2020-06-18 DIAGNOSIS — J9621 Acute and chronic respiratory failure with hypoxia: Secondary | ICD-10-CM | POA: Diagnosis not present

## 2020-06-18 DIAGNOSIS — J440 Chronic obstructive pulmonary disease with acute lower respiratory infection: Secondary | ICD-10-CM | POA: Diagnosis not present

## 2020-06-18 DIAGNOSIS — M1030 Gout due to renal impairment, unspecified site: Secondary | ICD-10-CM | POA: Diagnosis not present

## 2020-06-18 DIAGNOSIS — N189 Chronic kidney disease, unspecified: Secondary | ICD-10-CM | POA: Diagnosis not present

## 2020-06-18 DIAGNOSIS — I509 Heart failure, unspecified: Secondary | ICD-10-CM | POA: Diagnosis not present

## 2020-06-18 DIAGNOSIS — I130 Hypertensive heart and chronic kidney disease with heart failure and stage 1 through stage 4 chronic kidney disease, or unspecified chronic kidney disease: Secondary | ICD-10-CM | POA: Diagnosis not present

## 2020-06-19 DIAGNOSIS — I872 Venous insufficiency (chronic) (peripheral): Secondary | ICD-10-CM | POA: Diagnosis not present

## 2020-06-19 DIAGNOSIS — J440 Chronic obstructive pulmonary disease with acute lower respiratory infection: Secondary | ICD-10-CM | POA: Diagnosis not present

## 2020-06-19 DIAGNOSIS — M1030 Gout due to renal impairment, unspecified site: Secondary | ICD-10-CM | POA: Diagnosis not present

## 2020-06-19 DIAGNOSIS — J189 Pneumonia, unspecified organism: Secondary | ICD-10-CM | POA: Diagnosis not present

## 2020-06-19 DIAGNOSIS — J9621 Acute and chronic respiratory failure with hypoxia: Secondary | ICD-10-CM | POA: Diagnosis not present

## 2020-06-19 DIAGNOSIS — I130 Hypertensive heart and chronic kidney disease with heart failure and stage 1 through stage 4 chronic kidney disease, or unspecified chronic kidney disease: Secondary | ICD-10-CM | POA: Diagnosis not present

## 2020-06-19 DIAGNOSIS — I509 Heart failure, unspecified: Secondary | ICD-10-CM | POA: Diagnosis not present

## 2020-06-19 DIAGNOSIS — L97829 Non-pressure chronic ulcer of other part of left lower leg with unspecified severity: Secondary | ICD-10-CM | POA: Diagnosis not present

## 2020-06-19 DIAGNOSIS — N189 Chronic kidney disease, unspecified: Secondary | ICD-10-CM | POA: Diagnosis not present

## 2020-06-20 DIAGNOSIS — J440 Chronic obstructive pulmonary disease with acute lower respiratory infection: Secondary | ICD-10-CM | POA: Diagnosis not present

## 2020-06-20 DIAGNOSIS — N189 Chronic kidney disease, unspecified: Secondary | ICD-10-CM | POA: Diagnosis not present

## 2020-06-20 DIAGNOSIS — I872 Venous insufficiency (chronic) (peripheral): Secondary | ICD-10-CM | POA: Diagnosis not present

## 2020-06-20 DIAGNOSIS — M1030 Gout due to renal impairment, unspecified site: Secondary | ICD-10-CM | POA: Diagnosis not present

## 2020-06-20 DIAGNOSIS — J9621 Acute and chronic respiratory failure with hypoxia: Secondary | ICD-10-CM | POA: Diagnosis not present

## 2020-06-20 DIAGNOSIS — J189 Pneumonia, unspecified organism: Secondary | ICD-10-CM | POA: Diagnosis not present

## 2020-06-20 DIAGNOSIS — I509 Heart failure, unspecified: Secondary | ICD-10-CM | POA: Diagnosis not present

## 2020-06-20 DIAGNOSIS — L97829 Non-pressure chronic ulcer of other part of left lower leg with unspecified severity: Secondary | ICD-10-CM | POA: Diagnosis not present

## 2020-06-20 DIAGNOSIS — I130 Hypertensive heart and chronic kidney disease with heart failure and stage 1 through stage 4 chronic kidney disease, or unspecified chronic kidney disease: Secondary | ICD-10-CM | POA: Diagnosis not present

## 2020-06-23 DIAGNOSIS — Z1152 Encounter for screening for COVID-19: Secondary | ICD-10-CM | POA: Diagnosis not present

## 2020-06-23 DIAGNOSIS — Z03818 Encounter for observation for suspected exposure to other biological agents ruled out: Secondary | ICD-10-CM | POA: Diagnosis not present

## 2020-06-24 DIAGNOSIS — I509 Heart failure, unspecified: Secondary | ICD-10-CM | POA: Diagnosis not present

## 2020-06-24 DIAGNOSIS — J189 Pneumonia, unspecified organism: Secondary | ICD-10-CM | POA: Diagnosis not present

## 2020-06-24 DIAGNOSIS — M1030 Gout due to renal impairment, unspecified site: Secondary | ICD-10-CM | POA: Diagnosis not present

## 2020-06-24 DIAGNOSIS — J9621 Acute and chronic respiratory failure with hypoxia: Secondary | ICD-10-CM | POA: Diagnosis not present

## 2020-06-24 DIAGNOSIS — J440 Chronic obstructive pulmonary disease with acute lower respiratory infection: Secondary | ICD-10-CM | POA: Diagnosis not present

## 2020-06-24 DIAGNOSIS — I130 Hypertensive heart and chronic kidney disease with heart failure and stage 1 through stage 4 chronic kidney disease, or unspecified chronic kidney disease: Secondary | ICD-10-CM | POA: Diagnosis not present

## 2020-06-24 DIAGNOSIS — N189 Chronic kidney disease, unspecified: Secondary | ICD-10-CM | POA: Diagnosis not present

## 2020-06-24 DIAGNOSIS — I872 Venous insufficiency (chronic) (peripheral): Secondary | ICD-10-CM | POA: Diagnosis not present

## 2020-06-24 DIAGNOSIS — L97829 Non-pressure chronic ulcer of other part of left lower leg with unspecified severity: Secondary | ICD-10-CM | POA: Diagnosis not present

## 2020-06-25 DIAGNOSIS — N189 Chronic kidney disease, unspecified: Secondary | ICD-10-CM | POA: Diagnosis not present

## 2020-06-25 DIAGNOSIS — J9621 Acute and chronic respiratory failure with hypoxia: Secondary | ICD-10-CM | POA: Diagnosis not present

## 2020-06-25 DIAGNOSIS — I872 Venous insufficiency (chronic) (peripheral): Secondary | ICD-10-CM | POA: Diagnosis not present

## 2020-06-25 DIAGNOSIS — I130 Hypertensive heart and chronic kidney disease with heart failure and stage 1 through stage 4 chronic kidney disease, or unspecified chronic kidney disease: Secondary | ICD-10-CM | POA: Diagnosis not present

## 2020-06-25 DIAGNOSIS — J189 Pneumonia, unspecified organism: Secondary | ICD-10-CM | POA: Diagnosis not present

## 2020-06-25 DIAGNOSIS — M1030 Gout due to renal impairment, unspecified site: Secondary | ICD-10-CM | POA: Diagnosis not present

## 2020-06-25 DIAGNOSIS — I509 Heart failure, unspecified: Secondary | ICD-10-CM | POA: Diagnosis not present

## 2020-06-25 DIAGNOSIS — L97829 Non-pressure chronic ulcer of other part of left lower leg with unspecified severity: Secondary | ICD-10-CM | POA: Diagnosis not present

## 2020-06-25 DIAGNOSIS — J440 Chronic obstructive pulmonary disease with acute lower respiratory infection: Secondary | ICD-10-CM | POA: Diagnosis not present

## 2020-06-27 DIAGNOSIS — N189 Chronic kidney disease, unspecified: Secondary | ICD-10-CM | POA: Diagnosis not present

## 2020-06-27 DIAGNOSIS — I509 Heart failure, unspecified: Secondary | ICD-10-CM | POA: Diagnosis not present

## 2020-06-27 DIAGNOSIS — J9621 Acute and chronic respiratory failure with hypoxia: Secondary | ICD-10-CM | POA: Diagnosis not present

## 2020-06-27 DIAGNOSIS — M1030 Gout due to renal impairment, unspecified site: Secondary | ICD-10-CM | POA: Diagnosis not present

## 2020-06-27 DIAGNOSIS — J189 Pneumonia, unspecified organism: Secondary | ICD-10-CM | POA: Diagnosis not present

## 2020-06-27 DIAGNOSIS — I130 Hypertensive heart and chronic kidney disease with heart failure and stage 1 through stage 4 chronic kidney disease, or unspecified chronic kidney disease: Secondary | ICD-10-CM | POA: Diagnosis not present

## 2020-06-27 DIAGNOSIS — J440 Chronic obstructive pulmonary disease with acute lower respiratory infection: Secondary | ICD-10-CM | POA: Diagnosis not present

## 2020-06-27 DIAGNOSIS — I872 Venous insufficiency (chronic) (peripheral): Secondary | ICD-10-CM | POA: Diagnosis not present

## 2020-06-27 DIAGNOSIS — L97829 Non-pressure chronic ulcer of other part of left lower leg with unspecified severity: Secondary | ICD-10-CM | POA: Diagnosis not present

## 2020-07-01 DIAGNOSIS — L97829 Non-pressure chronic ulcer of other part of left lower leg with unspecified severity: Secondary | ICD-10-CM | POA: Diagnosis not present

## 2020-07-01 DIAGNOSIS — J440 Chronic obstructive pulmonary disease with acute lower respiratory infection: Secondary | ICD-10-CM | POA: Diagnosis not present

## 2020-07-01 DIAGNOSIS — J9621 Acute and chronic respiratory failure with hypoxia: Secondary | ICD-10-CM | POA: Diagnosis not present

## 2020-07-01 DIAGNOSIS — I509 Heart failure, unspecified: Secondary | ICD-10-CM | POA: Diagnosis not present

## 2020-07-01 DIAGNOSIS — I872 Venous insufficiency (chronic) (peripheral): Secondary | ICD-10-CM | POA: Diagnosis not present

## 2020-07-01 DIAGNOSIS — N189 Chronic kidney disease, unspecified: Secondary | ICD-10-CM | POA: Diagnosis not present

## 2020-07-01 DIAGNOSIS — J189 Pneumonia, unspecified organism: Secondary | ICD-10-CM | POA: Diagnosis not present

## 2020-07-01 DIAGNOSIS — I130 Hypertensive heart and chronic kidney disease with heart failure and stage 1 through stage 4 chronic kidney disease, or unspecified chronic kidney disease: Secondary | ICD-10-CM | POA: Diagnosis not present

## 2020-07-01 DIAGNOSIS — M1030 Gout due to renal impairment, unspecified site: Secondary | ICD-10-CM | POA: Diagnosis not present

## 2020-07-02 DIAGNOSIS — N189 Chronic kidney disease, unspecified: Secondary | ICD-10-CM | POA: Diagnosis not present

## 2020-07-02 DIAGNOSIS — J9621 Acute and chronic respiratory failure with hypoxia: Secondary | ICD-10-CM | POA: Diagnosis not present

## 2020-07-02 DIAGNOSIS — L97829 Non-pressure chronic ulcer of other part of left lower leg with unspecified severity: Secondary | ICD-10-CM | POA: Diagnosis not present

## 2020-07-02 DIAGNOSIS — J440 Chronic obstructive pulmonary disease with acute lower respiratory infection: Secondary | ICD-10-CM | POA: Diagnosis not present

## 2020-07-02 DIAGNOSIS — J189 Pneumonia, unspecified organism: Secondary | ICD-10-CM | POA: Diagnosis not present

## 2020-07-02 DIAGNOSIS — I509 Heart failure, unspecified: Secondary | ICD-10-CM | POA: Diagnosis not present

## 2020-07-02 DIAGNOSIS — M1030 Gout due to renal impairment, unspecified site: Secondary | ICD-10-CM | POA: Diagnosis not present

## 2020-07-02 DIAGNOSIS — I872 Venous insufficiency (chronic) (peripheral): Secondary | ICD-10-CM | POA: Diagnosis not present

## 2020-07-02 DIAGNOSIS — I130 Hypertensive heart and chronic kidney disease with heart failure and stage 1 through stage 4 chronic kidney disease, or unspecified chronic kidney disease: Secondary | ICD-10-CM | POA: Diagnosis not present

## 2020-07-03 ENCOUNTER — Ambulatory Visit: Payer: Self-pay | Admitting: *Deleted

## 2020-07-03 NOTE — Telephone Encounter (Signed)
Noted. Pt has an appt 07/03/2020.  KP

## 2020-07-03 NOTE — Telephone Encounter (Signed)
(  Patient was diagnosed with COVID 10/20- had infusion treatment and reports has since tested negative)  Patient is calling to report bilateral feet swelling that started yesterday- no redness- but painful to walk. Appointment scheduled per 24 hour protocol.  Reason for Disposition  [1] MILD swelling of both ankles (i.e., pedal edema) AND [2] new-onset or worsening  Answer Assessment - Initial Assessment Questions 1. ONSET: "When did the swelling start?" (e.g., minutes, hours, days)     yesterday 2. LOCATION: "What part of the leg is swollen?"  "Are both legs swollen or just one leg?"     Swelling in both feet up to the ankle 3. SEVERITY: "How bad is the swelling?" (e.g., localized; mild, moderate, severe)  - Localized - small area of swelling localized to one leg  - MILD pedal edema - swelling limited to foot and ankle, pitting edema < 1/4 inch (6 mm) deep, rest and elevation eliminate most or all swelling  - MODERATE edema - swelling of lower leg to knee, pitting edema > 1/4 inch (6 mm) deep, rest and elevation only partially reduce swelling  - SEVERE edema - swelling extends above knee, facial or hand swelling present    Mild/moderate 4. REDNESS: "Does the swelling look red or infected?"     no 5. PAIN: "Is the swelling painful to touch?" If Yes, ask: "How painful is it?"   (Scale 1-10; mild, moderate or severe)     Little painful- hurts to walk 6. FEVER: "Do you have a fever?" If Yes, ask: "What is it, how was it measured, and when did it start?"      no 7. CAUSE: "What do you think is causing the leg swelling?"     Not this bad 8. MEDICAL HISTORY: "Do you have a history of heart failure, kidney disease, liver failure, or cancer?"     Heart failure 9. RECURRENT SYMPTOM: "Have you had leg swelling before?" If Yes, ask: "When was the last time?" "What happened that time?"     Yes- not as bad- 1 year ago 10. OTHER SYMPTOMS: "Do you have any other symptoms?" (e.g., chest pain,  difficulty breathing)       no 11. PREGNANCY: "Is there any chance you are pregnant?" "When was your last menstrual period?"       n/a  Protocols used: LEG SWELLING AND EDEMA-A-AH

## 2020-07-04 ENCOUNTER — Other Ambulatory Visit: Payer: Self-pay | Admitting: Family Medicine

## 2020-07-04 ENCOUNTER — Ambulatory Visit (INDEPENDENT_AMBULATORY_CARE_PROVIDER_SITE_OTHER): Payer: Medicare HMO | Admitting: Family Medicine

## 2020-07-04 ENCOUNTER — Other Ambulatory Visit: Payer: Self-pay

## 2020-07-04 ENCOUNTER — Encounter: Payer: Self-pay | Admitting: Family Medicine

## 2020-07-04 VITALS — BP 130/59 | HR 66 | Temp 97.3°F | Resp 16 | Ht 66.0 in | Wt 161.2 lb

## 2020-07-04 DIAGNOSIS — R600 Localized edema: Secondary | ICD-10-CM

## 2020-07-04 DIAGNOSIS — K5903 Drug induced constipation: Secondary | ICD-10-CM

## 2020-07-04 DIAGNOSIS — I872 Venous insufficiency (chronic) (peripheral): Secondary | ICD-10-CM

## 2020-07-04 DIAGNOSIS — L97321 Non-pressure chronic ulcer of left ankle limited to breakdown of skin: Secondary | ICD-10-CM

## 2020-07-04 DIAGNOSIS — G2581 Restless legs syndrome: Secondary | ICD-10-CM

## 2020-07-04 DIAGNOSIS — I83223 Varicose veins of left lower extremity with both ulcer of ankle and inflammation: Secondary | ICD-10-CM

## 2020-07-04 MED ORDER — BISACODYL 5 MG PO TBEC: 10.0000 mg | DELAYED_RELEASE_TABLET | Freq: Every day | ORAL | 1 refills | Status: DC | PRN

## 2020-07-04 MED ORDER — ROPINIROLE HCL 2 MG PO TABS: 4.0000 mg | ORAL_TABLET | Freq: Every day | ORAL | 1 refills | Status: DC

## 2020-07-04 MED ORDER — POLYETHYLENE GLYCOL 3350 17 G PO PACK: 17.0000 g | PACK | Freq: Every day | ORAL | 1 refills | Status: DC | PRN

## 2020-07-04 NOTE — Patient Instructions (Addendum)
Thank you for coming to the office today.  For the swelling it is caused by leaky veins and circulation.  Goal is to try the compression stockings - new rx handwritten take to [facility]  Keep on Furosemide fluid pill HALF pill daily, if swelling is worse - may take WHOLE pill daily for 2-3 days then go back to HALF pill.  -----------------------------  Use RICE therapy: - R - Rest / relative rest with activity modification avoid overuse of joint - I - Ice packs (make sure you use a towel or sock / something to protect skin) - C - Compression with stocking to apply pressure and reduce swelling allowing more support - E - Elevation - if significant swelling, lift leg above heart level (toes above your nose) to help reduce swelling, most helpful at night after day of being on your feet   Preferred option  [facility] [place], [place] [address] Open until 5PM Phone: [phone]   Other option [place] [place], [place] [address] Ph: [phone]  Please schedule a Follow-up Appointment to: Return if symptoms worsen or fail to improve, for keep apt as scheduled.  If you have any other questions or concerns, please feel free to call the office or send a message through [place]. You may also schedule an earlier appointment if necessary.  Additionally, you may be receiving a survey about your experience at our office within a few days to 1 week by e-mail or mail. We value your feedback.  [name], DO [place]

## 2020-07-04 NOTE — Progress Notes (Signed)
Subjective:    [name], female    DOB: 01-03-1930, 86 y.o.   MRN: [id number]  Kristin [name] is a 86 y.o. female presenting on 07/04/2020 for Foot Swelling (onset 2 days)   HPI   Bilateral Lower Extremity foot/ankle edema Varicose Veins w/ Lower Ext Inflammation, L ankle ulcer limited breakdown Last seen [facility] 06/06/20 for similar problem, had ulceration that is healing, and edema, will have upcoming sclerotherapy upcoming - Not wearing compression - Has lasix taking half of 20mg  daily some good results but not resolving problem Denies pain or redness other areas of swelling, dyspnea  Will switch meds to PCP now instead of pain management - Ropinirole, P9olyethylene Glycol, Dulocolax. She is managed by [facility] Pain management Dr Dossie Arbour and they are rx her opiates only.   Depression screen [facility] 2/9 05/13/2020 04/01/2020 03/17/2020  Decreased Interest 0 0 0  Down, Depressed, Hopeless 0 0 0  PHQ - 2 Score 0 0 0  Altered sleeping - - -  Tired, decreased energy - - -  Change in appetite - - -  Feeling bad or failure about yourself  - - -  Trouble concentrating - - -  Moving slowly or fidgety/restless - - -  Suicidal thoughts - - -  PHQ-9 Score - - -  Difficult doing work/chores - - -  Some recent data might be hidden    Social History   Tobacco Use  . Smoking status: Former Smoker    Packs/day: 1.25    Years: 40.00    Pack years: 50.00    Types: Cigarettes    Quit date: 12/16/1998    Years since quitting: 21.5  . Smokeless tobacco: Former Network engineer  . Vaping Use: Never used  Substance Use Topics  . Alcohol use: No    Alcohol/week: 0.0 standard drinks  . Drug use: Yes    Types: Oxycodone    Review of Systems Per HPI unless specifically indicated above     Objective:    BP (!) 130/59   Pulse 66   Temp (!) 97.3 F (36.3 C) (Temporal)   Resp 16   Ht 5\' 6"  (1.676 m)   Wt 161 lb 3.2 oz (73.1 kg)   SpO2 93% Comment: on 2 liter of  oxygen at home  BMI 26.02 kg/m   Wt Readings from Last 3 Encounters:  07/04/20 161 lb 3.2 oz (73.1 kg)  06/06/20 154 lb (69.9 kg)  06/04/20 157 lb (71.2 kg)    Physical Exam Vitals and nursing note reviewed.  Constitutional:      General: She is not in acute distress.    Appearance: She is well-developed. She is not diaphoretic.     Comments: Well-appearing, comfortable, cooperative  HENT:     Head: Normocephalic and atraumatic.  Eyes:     General:        Right eye: No discharge.        Left eye: No discharge.     Conjunctiva/sclera: Conjunctivae normal.  Cardiovascular:     Rate and Rhythm: Normal rate.  Pulmonary:     Effort: Pulmonary effort is normal.  Musculoskeletal:     Right lower leg: Edema (+1-2 pitting edema foot ankle and lower leg, some varicose veins) present.     Left lower leg: Edema (similar to R) present.  Skin:    General: Skin is warm and dry.     Findings: No erythema or rash.  Neurological:  Mental Status: She is alert and oriented to person, place, and time.  Psychiatric:        Behavior: Behavior normal.     Comments: Well groomed, good eye contact, normal speech and thoughts        Results for orders placed or performed in visit on 06/03/20  COMPLETE METABOLIC PANEL WITH GFR  Result Value Ref Range   Glucose, Bld 77 65 - 99 mg/dL   BUN 33 (H) 7 - 25 mg/dL   Creat 1.86 (H) 0.60 - 0.88 mg/dL   GFR, Est Non African American 24 (L) > OR = 60 mL/min/1.43m2   GFR, Est African American 28 (L) > OR = 60 mL/min/1.73m2   BUN/Creatinine Ratio 18 6 - 22 (calc)   Sodium 139 135 - 146 mmol/L   Potassium 3.8 3.5 - 5.3 mmol/L   Chloride 109 98 - 110 mmol/L   CO2 18 (L) 20 - 32 mmol/L   Calcium 8.7 8.6 - 10.4 mg/dL   Total Protein 6.1 6.1 - 8.1 g/dL   Albumin 3.5 (L) 3.6 - 5.1 g/dL   Globulin 2.6 1.9 - 3.7 g/dL (calc)   AG Ratio 1.3 1.0 - 2.5 (calc)   Total Bilirubin 0.4 0.2 - 1.2 mg/dL   Alkaline phosphatase (APISO) 50 37 - 153 U/L   AST 28 10  - 35 U/L   ALT 16 6 - 29 U/L  CBC with Differential/Platelet  Result Value Ref Range   WBC 5.5 3.8 - 10.8 Thousand/uL   RBC 3.93 3.80 - 5.10 Million/uL   Hemoglobin 11.8 11.7 - 15.5 g/dL   HCT 37.4 35 - 45 %   MCV 95.2 80.0 - 100.0 fL   MCH 30.0 27.0 - 33.0 pg   MCHC 31.6 (L) 32.0 - 36.0 g/dL   RDW 12.8 11.0 - 15.0 %   Platelets 172 140 - 400 Thousand/uL   MPV 9.9 7.5 - 12.5 fL   Neutro Abs 3,075 1,500 - 7,800 cells/uL   Lymphs Abs 1,986 850 - 3,900 cells/uL   Absolute Monocytes 385 200 - 950 cells/uL   Eosinophils Absolute 33 15.0 - 500.0 cells/uL   Basophils Absolute 22 0.0 - 200.0 cells/uL   Neutrophils Relative % 55.9 %   Total Lymphocyte 36.1 %   Monocytes Relative 7.0 %   Eosinophils Relative 0.6 %   Basophils Relative 0.4 %      Assessment & Plan:   Problem List Items Addressed This Visit    Venous insufficiency of both lower extremities   Varicose veins of left lower extremity with inflammation, with ulcer of ankle limited to breakdown of skin ([place]) - Primary    Other Visit Diagnoses    Bilateral lower extremity edema          Clinically with edema, venous insufficiency and varicose veins Followed by [facility] Vascular Specialist  Reassurance, seems stable underlying chronic problem, no acute causes of swelling identified. No cellulitis and exam not suggestive of DVT  Handwritten rx Compression Stockings 20-24mmHg, given to patient can go to [facility] May increase / adjust Furosemide from half of 20mg  up to whole 20mg  PRN if need for 2-3 days then reduce to half dose  Additionally may limit oxygen at times during day if sedentary and asymptomatic. Should wear with activity and evening.  No orders of the defined types were placed in this encounter.     Follow up plan: Return if symptoms worsen or fail to improve, for keep apt as  scheduled.  [name], [facility] 07/04/2020, 11:34 AM

## 2020-07-09 ENCOUNTER — Ambulatory Visit (INDEPENDENT_AMBULATORY_CARE_PROVIDER_SITE_OTHER): Payer: Medicare HMO | Admitting: Vascular Surgery

## 2020-07-09 ENCOUNTER — Other Ambulatory Visit: Payer: Self-pay

## 2020-07-09 ENCOUNTER — Encounter (INDEPENDENT_AMBULATORY_CARE_PROVIDER_SITE_OTHER): Payer: Self-pay | Admitting: Vascular Surgery

## 2020-07-09 VITALS — BP 164/89 | HR 79 | Ht 67.0 in | Wt 161.0 lb

## 2020-07-09 DIAGNOSIS — L97321 Non-pressure chronic ulcer of left ankle limited to breakdown of skin: Secondary | ICD-10-CM

## 2020-07-09 DIAGNOSIS — I83223 Varicose veins of left lower extremity with both ulcer of ankle and inflammation: Secondary | ICD-10-CM | POA: Diagnosis not present

## 2020-07-09 NOTE — Progress Notes (Signed)
Varicose veins of left lower extremity with inflammation (454.1  I83.10) Current Plans   Indication: Patient presents with symptomatic varicose veins of the left lower extremity.   Procedure: Sclerotherapy using hypertonic saline mixed with 1% Lidocaine was performed on the left lower extremity. Compression wraps were placed. The patient tolerated the procedure well.  Of note: the patient does not have any veins amenable to saline sclerotherapy to the left ankle. I treated what hopefully could help. She needs to engage in conservative therapy such as compression socks, elevation and possibly a lymphedema pump to reduce the risk of further ulceration. This was discussed with the patient.  No further saline sclerotherapy needed at this time

## 2020-07-11 DIAGNOSIS — J440 Chronic obstructive pulmonary disease with acute lower respiratory infection: Secondary | ICD-10-CM | POA: Diagnosis not present

## 2020-07-11 DIAGNOSIS — M1030 Gout due to renal impairment, unspecified site: Secondary | ICD-10-CM | POA: Diagnosis not present

## 2020-07-11 DIAGNOSIS — I509 Heart failure, unspecified: Secondary | ICD-10-CM | POA: Diagnosis not present

## 2020-07-11 DIAGNOSIS — I130 Hypertensive heart and chronic kidney disease with heart failure and stage 1 through stage 4 chronic kidney disease, or unspecified chronic kidney disease: Secondary | ICD-10-CM | POA: Diagnosis not present

## 2020-07-11 DIAGNOSIS — L97829 Non-pressure chronic ulcer of other part of left lower leg with unspecified severity: Secondary | ICD-10-CM | POA: Diagnosis not present

## 2020-07-11 DIAGNOSIS — N189 Chronic kidney disease, unspecified: Secondary | ICD-10-CM | POA: Diagnosis not present

## 2020-07-11 DIAGNOSIS — J189 Pneumonia, unspecified organism: Secondary | ICD-10-CM | POA: Diagnosis not present

## 2020-07-11 DIAGNOSIS — J45998 Other asthma: Secondary | ICD-10-CM | POA: Diagnosis not present

## 2020-07-11 DIAGNOSIS — J9621 Acute and chronic respiratory failure with hypoxia: Secondary | ICD-10-CM | POA: Diagnosis not present

## 2020-07-11 DIAGNOSIS — I872 Venous insufficiency (chronic) (peripheral): Secondary | ICD-10-CM | POA: Diagnosis not present

## 2020-07-13 DIAGNOSIS — J45998 Other asthma: Secondary | ICD-10-CM | POA: Diagnosis not present

## 2020-07-14 ENCOUNTER — Ambulatory Visit: Payer: Self-pay

## 2020-07-14 NOTE — Telephone Encounter (Signed)
Patient called stating that she has painful swelling in both feet and lower legs.  She states that her symptoms started last week. She states that she saw Dr Parks Ranger and he told her to rest elevate her feet and legs and wear compression hose.  She states that these measures are not helping.  She states that she has occassional cough. No fever or SOB.  She rates the pain at 6-7.  She states she is able to walk. She has Hx of CHF.  Per protocol patient will go to UC in area.  No appointments available.  She will have another to drive.   Reason for Disposition . [1] MODERATE leg swelling (e.g., swelling extends up to knees) AND [2] new-onset or worsening  Answer Assessment - Initial Assessment Questions 1. ONSET: "When did the swelling start?" (e.g., minutes, hours, days)     Last week 2. LOCATION: "What part of the leg is swollen?"  "Are both legs swollen or just one leg?"    Both legs 3. SEVERITY: "How bad is the swelling?" (e.g., localized; mild, moderate, severe)  - Localized - small area of swelling localized to one leg  - MILD pedal edema - swelling limited to foot and ankle, pitting edema < 1/4 inch (6 mm) deep, rest and elevation eliminate most or all swelling  - MODERATE edema - swelling of lower leg to knee, pitting edema > 1/4 inch (6 mm) deep, rest and elevation only partially reduce swelling  - SEVERE edema - swelling extends above knee, facial or hand swelling present      moderate 4. REDNESS: "Does the swelling look red or infected?"     no 5. PAIN: "Is the swelling painful to touch?" If Yes, ask: "How painful is it?"   (Scale 1-10; mild, moderate or severe)     6-7 6. FEVER: "Do you have a fever?" If Yes, ask: "What is it, how was it measured, and when did it start?"      no 7. CAUSE: "What do you think is causing the leg swelling?"    unsure 8. MEDICAL HISTORY: "Do you have a history of heart failure, kidney disease, liver failure, or cancer?"     Heart Dx CHF 9. RECURRENT  SYMPTOM: "Have you had leg swelling before?" If Yes, ask: "When was the last time?" "What happened that time?"     never 10. OTHER SYMPTOMS: "Do you have any other symptoms?" (e.g., chest pain, difficulty breathing)       no 11. PREGNANCY: "Is there any chance you are pregnant?" "When was your last menstrual period?"       N/A  Protocols used: LEG SWELLING AND EDEMA-A-AH

## 2020-07-15 DIAGNOSIS — I130 Hypertensive heart and chronic kidney disease with heart failure and stage 1 through stage 4 chronic kidney disease, or unspecified chronic kidney disease: Secondary | ICD-10-CM | POA: Diagnosis not present

## 2020-07-15 DIAGNOSIS — J189 Pneumonia, unspecified organism: Secondary | ICD-10-CM | POA: Diagnosis not present

## 2020-07-15 DIAGNOSIS — I509 Heart failure, unspecified: Secondary | ICD-10-CM | POA: Diagnosis not present

## 2020-07-15 DIAGNOSIS — M1030 Gout due to renal impairment, unspecified site: Secondary | ICD-10-CM | POA: Diagnosis not present

## 2020-07-15 DIAGNOSIS — J9621 Acute and chronic respiratory failure with hypoxia: Secondary | ICD-10-CM | POA: Diagnosis not present

## 2020-07-15 DIAGNOSIS — J440 Chronic obstructive pulmonary disease with acute lower respiratory infection: Secondary | ICD-10-CM | POA: Diagnosis not present

## 2020-07-15 DIAGNOSIS — N189 Chronic kidney disease, unspecified: Secondary | ICD-10-CM | POA: Diagnosis not present

## 2020-07-15 DIAGNOSIS — L97829 Non-pressure chronic ulcer of other part of left lower leg with unspecified severity: Secondary | ICD-10-CM | POA: Diagnosis not present

## 2020-07-15 DIAGNOSIS — I872 Venous insufficiency (chronic) (peripheral): Secondary | ICD-10-CM | POA: Diagnosis not present

## 2020-07-16 DIAGNOSIS — N183 Chronic kidney disease, stage 3 unspecified: Secondary | ICD-10-CM | POA: Diagnosis not present

## 2020-07-16 DIAGNOSIS — I129 Hypertensive chronic kidney disease with stage 1 through stage 4 chronic kidney disease, or unspecified chronic kidney disease: Secondary | ICD-10-CM | POA: Diagnosis not present

## 2020-07-16 DIAGNOSIS — I517 Cardiomegaly: Secondary | ICD-10-CM | POA: Diagnosis not present

## 2020-07-16 DIAGNOSIS — R9431 Abnormal electrocardiogram [ECG] [EKG]: Secondary | ICD-10-CM | POA: Diagnosis not present

## 2020-07-16 DIAGNOSIS — I452 Bifascicular block: Secondary | ICD-10-CM | POA: Diagnosis not present

## 2020-07-16 DIAGNOSIS — M79604 Pain in right leg: Secondary | ICD-10-CM | POA: Diagnosis not present

## 2020-07-16 DIAGNOSIS — M79605 Pain in left leg: Secondary | ICD-10-CM | POA: Diagnosis not present

## 2020-07-16 DIAGNOSIS — J449 Chronic obstructive pulmonary disease, unspecified: Secondary | ICD-10-CM | POA: Diagnosis not present

## 2020-07-16 DIAGNOSIS — Z87891 Personal history of nicotine dependence: Secondary | ICD-10-CM | POA: Diagnosis not present

## 2020-07-16 DIAGNOSIS — J811 Chronic pulmonary edema: Secondary | ICD-10-CM | POA: Diagnosis not present

## 2020-07-16 DIAGNOSIS — M7989 Other specified soft tissue disorders: Secondary | ICD-10-CM | POA: Diagnosis not present

## 2020-07-16 DIAGNOSIS — I509 Heart failure, unspecified: Secondary | ICD-10-CM | POA: Diagnosis not present

## 2020-07-16 DIAGNOSIS — R0602 Shortness of breath: Secondary | ICD-10-CM | POA: Diagnosis not present

## 2020-07-28 ENCOUNTER — Ambulatory Visit (INDEPENDENT_AMBULATORY_CARE_PROVIDER_SITE_OTHER): Payer: Medicare HMO | Admitting: Family Medicine

## 2020-07-28 ENCOUNTER — Other Ambulatory Visit: Payer: Self-pay

## 2020-07-28 ENCOUNTER — Encounter: Payer: Self-pay | Admitting: Family Medicine

## 2020-07-28 VITALS — BP 145/81 | HR 70 | Temp 97.1°F | Resp 16 | Ht 66.0 in | Wt 158.0 lb

## 2020-07-28 DIAGNOSIS — R600 Localized edema: Secondary | ICD-10-CM

## 2020-07-28 DIAGNOSIS — I872 Venous insufficiency (chronic) (peripheral): Secondary | ICD-10-CM

## 2020-07-28 DIAGNOSIS — L97321 Non-pressure chronic ulcer of left ankle limited to breakdown of skin: Secondary | ICD-10-CM

## 2020-07-28 DIAGNOSIS — I5032 Chronic diastolic (congestive) heart failure: Secondary | ICD-10-CM | POA: Diagnosis not present

## 2020-07-28 DIAGNOSIS — I83223 Varicose veins of left lower extremity with both ulcer of ankle and inflammation: Secondary | ICD-10-CM

## 2020-07-28 MED ORDER — FUROSEMIDE 40 MG PO TABS: 40.0000 mg | ORAL_TABLET | Freq: Every day | ORAL | 2 refills | Status: DC | PRN

## 2020-07-28 NOTE — Patient Instructions (Addendum)
Thank you for coming to the office today.  Increased Furosemide from 20mg  up to 40mg  - take one daily as needed for leg swelling.  Upcoming apt with Vascular 12/15 - discuss the swelling and pain.  Also very important to call Heart doctor and schedule apt  Isaias Cowman, MD  [place]  [facility]  [place], [place] [address]  [phone]    Please schedule a Follow-up Appointment to: Return if symptoms worsen or fail to improve, for keep apt January.  If you have any other questions or concerns, please feel free to call the office or send a message through [place]. You may also schedule an earlier appointment if necessary.  Additionally, you may be receiving a survey about your experience at our office within a few days to 1 week by e-mail or mail. We value your feedback.  [name], DO [place]

## 2020-07-28 NOTE — Progress Notes (Signed)
Subjective:    [name], female    DOB: Oct 04, 1929, 86 y.o.   MRN: [id number]  Kristin [name] is a 86 y.o. female presenting on 07/28/2020 for Hospitalization Follow-up (leg swelling onset 2 weeks--soreness both side)   HPI  Bilateral Lower Extremity foot/ankle edema Varicose Veins w/ Lower Ext Inflammation, L ankle ulcer limited breakdown  Last visit with me 07/09/20 for same problem, she was given handwritten rx compression stockings and we increased Furosemide from half of 20mg  up to whole tab 20mg  daily  Updates in interval she is not always adhering to compression stocking wear  Last seen Vascular [facility] 06/06/20 for similar problem, had ulceration that is healing, and edema, will have upcoming sclerotherapy upcoming - Not wearing compression - Has lasix taking half of 20mg  daily some good results but not resolving problem Denies pain or redness other areas of swelling, dyspnea    Depression screen [facility] 2/9 05/13/2020 04/01/2020 03/17/2020  Decreased Interest 0 0 0  Down, Depressed, Hopeless 0 0 0  PHQ - 2 Score 0 0 0  Altered sleeping - - -  Tired, decreased energy - - -  Change in appetite - - -  Feeling bad or failure about yourself  - - -  Trouble concentrating - - -  Moving slowly or fidgety/restless - - -  Suicidal thoughts - - -  PHQ-9 Score - - -  Difficult doing work/chores - - -  Some recent data might be hidden    Social History   Tobacco Use  . Smoking status: Former Smoker    Packs/day: 1.25    Years: 40.00    Pack years: 50.00    Types: Cigarettes    Quit date: 12/16/1998    Years since quitting: 21.6  . Smokeless tobacco: Former Network engineer  . Vaping Use: Never used  Substance Use Topics  . Alcohol use: No    Alcohol/week: 0.0 standard drinks  . Drug use: Yes    Types: Oxycodone    Review of Systems Per HPI unless specifically indicated above     Objective:    BP (!) 145/81   Pulse 70   Temp (!) 97.1 F (36.2 C)  (Temporal)   Resp 16   Ht 5\' 6"  (1.676 m)   Wt 158 lb (71.7 kg)   SpO2 96%   BMI 25.50 kg/m   Wt Readings from Last 3 Encounters:  07/28/20 158 lb (71.7 kg)  07/09/20 161 lb (73 kg)  07/04/20 161 lb 3.2 oz (73.1 kg)    Physical Exam Vitals and nursing note reviewed.  Constitutional:      General: She is not in acute distress.    Appearance: She is well-developed. She is not diaphoretic.     Comments: Well-appearing, comfortable, cooperative  HENT:     Head: Normocephalic and atraumatic.  Eyes:     General:        Right eye: No discharge.        Left eye: No discharge.     Conjunctiva/sclera: Conjunctivae normal.  Cardiovascular:     Rate and Rhythm: Normal rate.  Pulmonary:     Effort: Pulmonary effort is normal.  Musculoskeletal:     Right lower leg: Edema (+1-2 pitting edema foot ankle and lower leg, some varicose veins) present.     Left lower leg: Edema (similar to R) present.  Skin:    General: Skin is warm and dry.  Findings: No erythema or rash.  Neurological:     Mental Status: She is alert and oriented to person, place, and time.  Psychiatric:        Behavior: Behavior normal.     Comments: Well groomed, good eye contact, normal speech and thoughts    Results for orders placed or performed in visit on 06/03/20  COMPLETE METABOLIC PANEL WITH GFR  Result Value Ref Range   Glucose, Bld 77 65 - 99 mg/dL   BUN 33 (H) 7 - 25 mg/dL   Creat 1.86 (H) 0.60 - 0.88 mg/dL   GFR, Est Non African American 24 (L) > OR = 60 mL/min/1.27m2   GFR, Est African American 28 (L) > OR = 60 mL/min/1.22m2   BUN/Creatinine Ratio 18 6 - 22 (calc)   Sodium 139 135 - 146 mmol/L   Potassium 3.8 3.5 - 5.3 mmol/L   Chloride 109 98 - 110 mmol/L   CO2 18 (L) 20 - 32 mmol/L   Calcium 8.7 8.6 - 10.4 mg/dL   Total Protein 6.1 6.1 - 8.1 g/dL   Albumin 3.5 (L) 3.6 - 5.1 g/dL   Globulin 2.6 1.9 - 3.7 g/dL (calc)   AG Ratio 1.3 1.0 - 2.5 (calc)   Total Bilirubin 0.4 0.2 - 1.2 mg/dL    Alkaline phosphatase (APISO) 50 37 - 153 U/L   AST 28 10 - 35 U/L   ALT 16 6 - 29 U/L  CBC with Differential/Platelet  Result Value Ref Range   WBC 5.5 3.8 - 10.8 Thousand/uL   RBC 3.93 3.80 - 5.10 Million/uL   Hemoglobin 11.8 11.7 - 15.5 g/dL   HCT 37.4 35 - 45 %   MCV 95.2 80.0 - 100.0 fL   MCH 30.0 27.0 - 33.0 pg   MCHC 31.6 (L) 32.0 - 36.0 g/dL   RDW 12.8 11.0 - 15.0 %   Platelets 172 140 - 400 Thousand/uL   MPV 9.9 7.5 - 12.5 fL   Neutro Abs 3,075 1,500 - 7,800 cells/uL   Lymphs Abs 1,986 850 - 3,900 cells/uL   Absolute Monocytes 385 200 - 950 cells/uL   Eosinophils Absolute 33 15.0 - 500.0 cells/uL   Basophils Absolute 22 0.0 - 200.0 cells/uL   Neutrophils Relative % 55.9 %   Total Lymphocyte 36.1 %   Monocytes Relative 7.0 %   Eosinophils Relative 0.6 %   Basophils Relative 0.4 %      Assessment & Plan:   Problem List Items Addressed This Visit    Venous insufficiency of both lower extremities   Relevant Medications   furosemide (LASIX) 40 MG tablet   Varicose veins of left lower extremity with inflammation, 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 No cellulitis and exam not suggestive of DVT  Counseling on using compression stockings Increase dose Furosemide from 20mg  to 40mg  daily PRN, counsel on use  Follow up with Cardiology / Vascular  Meds ordered this  encounter  Medications  . furosemide (LASIX) 40 MG tablet    Sig: Take 1 tablet (40  mg total) by mouth daily as needed for fluid or edema.    Dispense:  30 tablet    Refill:  2    Dose increase      Follow up plan: Return if symptoms worsen or fail to improve, for keep apt January.   [name], [facility] 07/28/2020, 11:35 AM

## 2020-07-29 DIAGNOSIS — I4892 Unspecified atrial flutter: Secondary | ICD-10-CM | POA: Diagnosis not present

## 2020-07-29 DIAGNOSIS — I5033 Acute on chronic diastolic (congestive) heart failure: Secondary | ICD-10-CM | POA: Diagnosis not present

## 2020-07-29 DIAGNOSIS — E785 Hyperlipidemia, unspecified: Secondary | ICD-10-CM | POA: Diagnosis not present

## 2020-07-29 DIAGNOSIS — J449 Chronic obstructive pulmonary disease, unspecified: Secondary | ICD-10-CM | POA: Diagnosis not present

## 2020-07-29 DIAGNOSIS — I10 Essential (primary) hypertension: Secondary | ICD-10-CM | POA: Diagnosis not present

## 2020-07-29 DIAGNOSIS — I4891 Unspecified atrial fibrillation: Secondary | ICD-10-CM | POA: Diagnosis not present

## 2020-07-29 DIAGNOSIS — I5032 Chronic diastolic (congestive) heart failure: Secondary | ICD-10-CM | POA: Diagnosis not present

## 2020-07-29 DIAGNOSIS — Z950 Presence of cardiac pacemaker: Secondary | ICD-10-CM | POA: Diagnosis not present

## 2020-08-02 ENCOUNTER — Inpatient Hospital Stay (HOSPITAL_COMMUNITY)
Admission: EM | Admit: 2020-08-02 | Discharge: 2020-08-05 | DRG: 291 | Disposition: A | Discharge status: Home Health | Payer: Medicare HMO | Attending: Internal Medicine | Admitting: Internal Medicine

## 2020-08-02 ENCOUNTER — Emergency Department (HOSPITAL_COMMUNITY): Payer: Medicare HMO

## 2020-08-02 ENCOUNTER — Other Ambulatory Visit: Payer: Self-pay

## 2020-08-02 DIAGNOSIS — G2581 Restless legs syndrome: Secondary | ICD-10-CM | POA: Diagnosis present

## 2020-08-02 DIAGNOSIS — I5033 Acute on chronic diastolic (congestive) heart failure: Secondary | ICD-10-CM | POA: Diagnosis not present

## 2020-08-02 DIAGNOSIS — Z853 Personal history of malignant neoplasm of breast: Secondary | ICD-10-CM | POA: Diagnosis not present

## 2020-08-02 DIAGNOSIS — Z96653 Presence of artificial knee joint, bilateral: Secondary | ICD-10-CM | POA: Diagnosis present

## 2020-08-02 DIAGNOSIS — Z950 Presence of cardiac pacemaker: Secondary | ICD-10-CM

## 2020-08-02 DIAGNOSIS — N3281 Overactive bladder: Secondary | ICD-10-CM | POA: Diagnosis present

## 2020-08-02 DIAGNOSIS — N1832 Chronic kidney disease, stage 3b: Secondary | ICD-10-CM | POA: Diagnosis present

## 2020-08-02 DIAGNOSIS — Z803 Family history of malignant neoplasm of breast: Secondary | ICD-10-CM

## 2020-08-02 DIAGNOSIS — Z841 Family history of disorders of kidney and ureter: Secondary | ICD-10-CM

## 2020-08-02 DIAGNOSIS — J440 Chronic obstructive pulmonary disease with acute lower respiratory infection: Secondary | ICD-10-CM | POA: Diagnosis present

## 2020-08-02 DIAGNOSIS — J9811 Atelectasis: Secondary | ICD-10-CM | POA: Diagnosis present

## 2020-08-02 DIAGNOSIS — I5032 Chronic diastolic (congestive) heart failure: Secondary | ICD-10-CM | POA: Diagnosis present

## 2020-08-02 DIAGNOSIS — J849 Interstitial pulmonary disease, unspecified: Secondary | ICD-10-CM | POA: Diagnosis present

## 2020-08-02 DIAGNOSIS — G8929 Other chronic pain: Secondary | ICD-10-CM | POA: Diagnosis present

## 2020-08-02 DIAGNOSIS — Z87891 Personal history of nicotine dependence: Secondary | ICD-10-CM

## 2020-08-02 DIAGNOSIS — I495 Sick sinus syndrome: Secondary | ICD-10-CM | POA: Diagnosis present

## 2020-08-02 DIAGNOSIS — Z881 Allergy status to other antibiotic agents status: Secondary | ICD-10-CM | POA: Diagnosis not present

## 2020-08-02 DIAGNOSIS — Z79899 Other long term (current) drug therapy: Secondary | ICD-10-CM | POA: Diagnosis not present

## 2020-08-02 DIAGNOSIS — I5023 Acute on chronic systolic (congestive) heart failure: Secondary | ICD-10-CM | POA: Diagnosis not present

## 2020-08-02 DIAGNOSIS — J9621 Acute and chronic respiratory failure with hypoxia: Secondary | ICD-10-CM | POA: Diagnosis not present

## 2020-08-02 DIAGNOSIS — R9431 Abnormal electrocardiogram [ECG] [EKG]: Secondary | ICD-10-CM | POA: Diagnosis present

## 2020-08-02 DIAGNOSIS — Z7901 Long term (current) use of anticoagulants: Secondary | ICD-10-CM | POA: Diagnosis not present

## 2020-08-02 DIAGNOSIS — J9601 Acute respiratory failure with hypoxia: Secondary | ICD-10-CM | POA: Diagnosis present

## 2020-08-02 DIAGNOSIS — R918 Other nonspecific abnormal finding of lung field: Secondary | ICD-10-CM | POA: Diagnosis not present

## 2020-08-02 DIAGNOSIS — E876 Hypokalemia: Secondary | ICD-10-CM | POA: Diagnosis present

## 2020-08-02 DIAGNOSIS — Z888 Allergy status to other drugs, medicaments and biological substances status: Secondary | ICD-10-CM

## 2020-08-02 DIAGNOSIS — Z9049 Acquired absence of other specified parts of digestive tract: Secondary | ICD-10-CM

## 2020-08-02 DIAGNOSIS — Z8249 Family history of ischemic heart disease and other diseases of the circulatory system: Secondary | ICD-10-CM

## 2020-08-02 DIAGNOSIS — R069 Unspecified abnormalities of breathing: Secondary | ICD-10-CM | POA: Diagnosis not present

## 2020-08-02 DIAGNOSIS — Z8616 Personal history of COVID-19: Secondary | ICD-10-CM | POA: Diagnosis not present

## 2020-08-02 DIAGNOSIS — I480 Paroxysmal atrial fibrillation: Secondary | ICD-10-CM | POA: Diagnosis present

## 2020-08-02 DIAGNOSIS — Z90710 Acquired absence of both cervix and uterus: Secondary | ICD-10-CM

## 2020-08-02 DIAGNOSIS — Z20822 Contact with and (suspected) exposure to covid-19: Secondary | ICD-10-CM | POA: Diagnosis not present

## 2020-08-02 DIAGNOSIS — I4891 Unspecified atrial fibrillation: Secondary | ICD-10-CM | POA: Diagnosis not present

## 2020-08-02 DIAGNOSIS — I130 Hypertensive heart and chronic kidney disease with heart failure and stage 1 through stage 4 chronic kidney disease, or unspecified chronic kidney disease: Secondary | ICD-10-CM | POA: Diagnosis not present

## 2020-08-02 DIAGNOSIS — R0902 Hypoxemia: Secondary | ICD-10-CM | POA: Diagnosis not present

## 2020-08-02 DIAGNOSIS — J189 Pneumonia, unspecified organism: Secondary | ICD-10-CM | POA: Diagnosis not present

## 2020-08-02 DIAGNOSIS — E785 Hyperlipidemia, unspecified: Secondary | ICD-10-CM | POA: Diagnosis present

## 2020-08-02 DIAGNOSIS — J90 Pleural effusion, not elsewhere classified: Secondary | ICD-10-CM | POA: Diagnosis not present

## 2020-08-02 DIAGNOSIS — I959 Hypotension, unspecified: Secondary | ICD-10-CM | POA: Diagnosis not present

## 2020-08-02 DIAGNOSIS — R0602 Shortness of breath: Secondary | ICD-10-CM | POA: Diagnosis not present

## 2020-08-02 DIAGNOSIS — R509 Fever, unspecified: Secondary | ICD-10-CM | POA: Diagnosis not present

## 2020-08-02 DIAGNOSIS — M109 Gout, unspecified: Secondary | ICD-10-CM | POA: Diagnosis present

## 2020-08-02 DIAGNOSIS — Z823 Family history of stroke: Secondary | ICD-10-CM

## 2020-08-02 LAB — BASIC METABOLIC PANEL
Anion gap: 12 (ref 5–15)
BUN: 25 mg/dL — ABNORMAL HIGH (ref 8–23)
CO2: 23 mmol/L (ref 22–32)
Calcium: 9.1 mg/dL (ref 8.9–10.3)
Chloride: 103 mmol/L (ref 98–111)
Creatinine, Ser: 1.22 mg/dL — ABNORMAL HIGH (ref 0.44–1.00)
GFR, Estimated: 43 mL/min — ABNORMAL LOW (ref >60)
Glucose, Bld: 84 mg/dL (ref 70–99)
Potassium: 3.8 mmol/L (ref 3.5–5.1)
Sodium: 138 mmol/L (ref 135–145)

## 2020-08-02 LAB — CBC WITH DIFFERENTIAL/PLATELET
Abs Immature Granulocytes: 0.11 10*3/uL — ABNORMAL HIGH (ref 0.00–0.07)
Basophils Absolute: 0.1 10*3/uL (ref 0.0–0.1)
Basophils Relative: 0 %
Eosinophils Absolute: 0.1 10*3/uL (ref 0.0–0.5)
Eosinophils Relative: 0 %
HCT: 42 % (ref 36.0–46.0)
Hemoglobin: 13 g/dL (ref 12.0–15.0)
Immature Granulocytes: 1 %
Lymphocytes Relative: 13 %
Lymphs Abs: 2.2 10*3/uL (ref 0.7–4.0)
MCH: 30.1 pg (ref 26.0–34.0)
MCHC: 31 g/dL (ref 30.0–36.0)
MCV: 97.2 fL (ref 80.0–100.0)
Monocytes Absolute: 0.5 10*3/uL (ref 0.1–1.0)
Monocytes Relative: 3 %
Neutro Abs: 15 10*3/uL — ABNORMAL HIGH (ref 1.7–7.7)
Neutrophils Relative %: 83 %
Platelets: 125 10*3/uL — ABNORMAL LOW (ref 150–400)
RBC: 4.32 MIL/uL (ref 3.87–5.11)
RDW: 14.3 % (ref 11.5–15.5)
WBC: 18 10*3/uL — ABNORMAL HIGH (ref 4.0–10.5)
nRBC: 0 % (ref 0.0–0.2)

## 2020-08-02 LAB — RESP PANEL BY RT-PCR (FLU A&B, COVID) ARPGX2
Influenza A by PCR: NEGATIVE
Influenza B by PCR: NEGATIVE
SARS Coronavirus 2 by RT PCR: NEGATIVE

## 2020-08-02 LAB — BRAIN NATRIURETIC PEPTIDE: B Natriuretic Peptide: 1540 pg/mL — ABNORMAL HIGH (ref 0.0–100.0)

## 2020-08-02 LAB — LACTIC ACID, PLASMA: Lactic Acid, Venous: 1.8 mmol/L (ref 0.5–1.9)

## 2020-08-02 MED ORDER — SODIUM CHLORIDE 0.9 % IV SOLN
1.0000 g | Freq: Once | INTRAVENOUS | Status: AC
  Administered 2020-08-02: 1 g via INTRAVENOUS
  Filled 2020-08-02: qty 10

## 2020-08-02 MED ORDER — AZITHROMYCIN 250 MG PO TABS
500.0000 mg | ORAL_TABLET | Freq: Once | ORAL | Status: AC
  Administered 2020-08-02: 500 mg via ORAL
  Filled 2020-08-02: qty 2

## 2020-08-02 MED ORDER — FUROSEMIDE 10 MG/ML IJ SOLN
40.0000 mg | Freq: Once | INTRAMUSCULAR | Status: AC
  Administered 2020-08-02: 40 mg via INTRAVENOUS
  Filled 2020-08-02: qty 4

## 2020-08-02 NOTE — ED Triage Notes (Signed)
Pt with SOB with fever since last night.  100.5 in triage.  Pt with pain to bilateral legs and feet x 2 weeks with swelling.

## 2020-08-02 NOTE — ED Notes (Signed)
Pt placed on purewick 

## 2020-08-02 NOTE — ED Notes (Signed)
Call to lab x 2 to attempt blood draw.

## 2020-08-02 NOTE — ED Provider Notes (Addendum)
[facility] Provider Note   CSN: [id number] Arrival date & time: 08/02/20  1231     History Chief Complaint  Patient presents with   Shortness of Breath    fever    Kristin [name] is a 86 y.o. female.  HPI   This patient is a very pleasant 86 year old female, she has a known history of prior breast cancer, history of congestive heart failure and COPD who wears 2 L of oxygen at night.  The patient had been admitted to the hospital in October approximately 2 months ago during which time she was evaluated, had her oxygen made 2 L chronically.  She reports that she lives with her son but generally takes care of herself independently.  Her lisinopril was discontinued, she was discharged on Augmentin and Zithromax and improved.  She does take apixaban for her history of atrial fibrillation.  She was seen November 24 at an outside hospital during which time she had her Lasix increased for what appeared to be congestive heart failure exacerbation.  She was seen at the [facility] cardiology clinic on December 7 approximately 4 or 5 days ago, it was noted that her last pacemaker interrogation was October 20, normal dual-chamber pacemaker with predominant atrial pacing, there was no evidence of atrial fibrillation.  Plenty of battery life left.  Last ejection fraction was 50% based on echocardiogram from 2018.  She now reports that she has recently had increasing shortness of breath, she has had some discomfort in her bilateral legs with associated swelling.  She had had a fever on arrival to 100.5, this has been persistent, gradually worsening and prompted her visit today because of shortness of breath.  No associated vomiting or diarrhea, she is wearing her compression stockings and taking her medication exactly as prescribed by her doctor    Past Medical History:  Diagnosis Date   Acute postoperative pain 02/09/2018   Arrhythmia, sinus node 04/03/2014   Arthritis     Arthritis    Atrial fibrillation (HCC)    Back pain    lower back chronic   Bradycardia    Breast cancer ([place]) 2004   right breast cancer   Cancer ([place]) 2004   rt breast cancer-post lumpectomy- chemo/rad   CHF (congestive heart failure) (HCC)    Chronic back pain    CKD (chronic kidney disease)    COPD (chronic obstructive pulmonary disease) ([place])    wears O2 at 2L via [place] at night   Cystocele    Decubitus ulcers    Dehydration    Gout    Hematuria    Hepatitis C    Hyperlipidemia    Hypertension    Hypomagnesemia 06/25/2015   OAB (overactive bladder)    Overactive bladder    Restless leg    Shoulder pain, bilateral    Urinary frequency    UTI (lower urinary tract infection)    Vaginal atrophy     Patient Active Problem List   Diagnosis Date Noted   Venous insufficiency of both lower extremities 07/04/2020   COVID-19 06/11/2020   Respiratory failure with hypoxia ([place]) 05/23/2020   Varicose veins of left lower extremity with inflammation, with ulcer of ankle limited to breakdown of skin ([place]) 32/20/2542   Uncomplicated opioid dependence ([place]) 02/14/2020   Abnormal MRI, lumbar spine (11/03/2019) 01/23/2020   Pharmacologic therapy 12/20/2019   Other specified dorsopathies, sacral and sacrococcygeal region 06/12/2019   Chronic hip pain (Left) 06/12/2019   Osteoarthritis of  hip (Left) 06/12/2019   Greater trochanteric bursitis of hip (Left) 03/12/2019   Preop testing 01/22/2019   Cervicalgia 10/03/2018   Chronic shoulders pain (Bilateral) (L>R) 05/29/2018   Osteoarthritis of shoulder (Bilateral) 05/29/2018   Osteoarthritis of hip (Bilateral) 05/29/2018   Chronic shoulder pain after shoulder replacement (Bilateral) (L>R) 05/29/2018   Trigger finger, left middle finger 05/29/2018   Spondylosis without myelopathy or radiculopathy, lumbosacral region 02/09/2018   Insomnia 02/01/2018   Numbness and tingling of upper extremity  (Bilateral) 09/07/2017   Chronic shoulder pain, Radicular (Bilateral) (L>R) 09/07/2017   Cervical facet syndrome (Bilateral) (L>R) 09/07/2017   Cervical (3-4 mm) Anterolisthesis of C4 on C5 09/07/2017   Cervical foraminal stenosis (Bilateral) 09/07/2017   Chronic lower extremity pain (Secondary Area of Pain) (Bilateral) (L>R) 05/13/2017   DDD (degenerative disc disease), cervical 05/13/2017   Chronic cervical radicular pain [facility] Area of Pain) (Bilateral) (L>R) 05/04/2017   Chronic anticoagulation (Eliquis) 05/04/2017   Chronic hip pain (Bilateral) 04/20/2017   Chronic neck pain 03/01/2017   Hyperlipidemia 02/02/2017   Red blood cell antibody positive 11/18/2016   Anticoagulated 11/17/2016   Atrial fibrillation and flutter ([place]) 11/17/2016   Osteoarthritis involving multiple joints 06/14/2016   Cardiac pacemaker in situ 01/07/2016   S/P cardiac pacemaker procedure 01/07/2016   GERD (gastroesophageal reflux disease) 12/18/2015   Chronic pain syndrome 06/25/2015   Chronic low back pain (Primary Area of Pain) (Bilateral) (L>R) 06/25/2015   Lumbar facet syndrome (Bilateral) (L>R) 06/25/2015   Lumbar spondylosis 06/25/2015   Chronic lumbar radicular pain (Bilateral) (L>R) 06/25/2015   Restless leg syndrome 06/25/2015   Myofascial pain 06/25/2015   Neurogenic pain 06/25/2015   Long term current use of opiate analgesic 06/25/2015   Long term prescription opiate use 06/25/2015   Opiate use (90 MME/Day) 06/25/2015   Opioid-induced constipation (OIC) 06/25/2015   Vitamin D insufficiency 06/25/2015   Grade 1 Retrolisthesis of L1 over L2 06/25/2015   Lumbar facet hypertrophy (L1-2, L2-3, and L4-5) 06/25/2015   Lumbar lateral recess stenosis (L1-2 and L4-5) 06/25/2015   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) 06/25/2015   Epidural fibrosis 06/25/2015   Lumbar foraminal stenosis (Severe Left L4-5) 06/25/2015    Osteoporosis 06/25/2015   Chronic sacroiliac joint pain (Left) 06/25/2015   History of methicillin resistant staphylococcus aureus (MRSA) 06/25/2015   Mitral valve prolapse 06/25/2015   Seasonal allergies 06/17/2015   History of breast cancer 06/17/2015   Chronic diastolic CHF (congestive heart failure) ([place]) 12/26/2014   Centrilobular emphysema ([place]) 09/16/2014   S/P cardiac catheterization 04/04/2014   DDD (degenerative disc disease), lumbosacral 04/03/2014   Benign hypertension with CKD (chronic kidney disease) stage III ([place]) 04/03/2014   Lumbar central spinal stenosis 04/03/2014   Chronic kidney disease (CKD), stage III (moderate) ([place]) 10/30/2013   Gout 10/30/2013    Past Surgical History:  Procedure Laterality Date   ABDOMINAL HYSTERECTOMY     BACK SURGERY     BREAST LUMPECTOMY Right 2004   BREAST SURGERY     rt lumpectomy   CARPAL TUNNEL RELEASE     CATARACT EXTRACTION W/PHACO  10/19/2011   Procedure: CATARACT EXTRACTION PHACO AND INTRAOCULAR LENS PLACEMENT ([place]);  Surgeon: Elta Guadeloupe T. Gershon Crane, MD;  Location: [facility];  Service: Ophthalmology;  Laterality: Right;  CDE:10.81   CATARACT EXTRACTION W/PHACO  11/02/2011   Procedure: CATARACT EXTRACTION PHACO AND INTRAOCULAR LENS PLACEMENT ([facility]);  Surgeon: Elta Guadeloupe T. Gershon Crane, MD;  Location: [facility];  Service: Ophthalmology;  Laterality: Left;  CDE 8.60  CHOLECYSTECTOMY     3/18   COLON SURGERY     INSERT / REPLACE / REMOVE PACEMAKER     JOINT REPLACEMENT     bilateral TKA   PACEMAKER INSERTION N/A 12/24/2015   Procedure: INSERTION PACEMAKER;  Surgeon: Isaias Cowman, MD;  Location: [facility] ORS;  Service: Cardiovascular;  Laterality: N/A;   ROTATOR CUFF REPAIR     bilateral     OB History   No obstetric history on file.     Family History  Problem Relation Age of Onset   Kidney disease Brother    Heart disease Father    Heart disease Mother    Stroke Mother    Cancer Sister    Breast cancer  Sister 14   Breast cancer Paternal Aunt    Anesthesia problems Neg Hx    Hypotension Neg Hx    Malignant hyperthermia Neg Hx    Pseudochol deficiency Neg Hx     Social History   Tobacco Use   Smoking status: Former Smoker    Packs/day: 1.25    Years: 40.00    Pack years: 50.00    Types: Cigarettes    Quit date: 12/16/1998    Years since quitting: 21.6   Smokeless tobacco: Former Counsellor Use: Never used  Substance Use Topics   Alcohol use: No    Alcohol/week: 0.0 standard drinks   Drug use: Yes    Types: Oxycodone    Home Medications Prior to Admission medications   Medication Sig Start Date End Date Taking? Authorizing Provider  acetaminophen (TYLENOL) 500 MG tablet Take 500 mg by mouth 3 (three) times daily.   Yes [provider]  albuterol (PROVENTIL) (2.5 MG/3ML) 0.083% nebulizer solution Take 3 mLs (2.5 mg total) by nebulization every 8 (eight) hours as needed for wheezing or shortness of breath. 10/02/19 10/01/20 Yes Karamalegos, Devonne Doughty, DO  apixaban (ELIQUIS) 5 MG TABS tablet Take 1 tablet (5 mg total) by mouth 2 (two) times daily. Patient taking differently: Take 5 mg by mouth 2 (two) times daily. For stroke prevention 10/30/15  Yes Wieting, [name], MD  bisacodyl (DULCOLAX) 5 MG EC tablet Take 2 tablets (10 mg total) by mouth daily as needed for moderate constipation. 07/04/20  Yes Karamalegos, Devonne Doughty, DO  Calcium Carbonate-Vitamin D 600-200 MG-UNIT TABS Take by mouth.   Yes [provider]  Cholecalciferol (VITAMIN D) 2000 units tablet Take 2,000 Units by mouth daily.    Yes [provider]  dextromethorphan-guaiFENesin (MUCINEX DM) 30-600 MG 12hr tablet Take 1 tablet by mouth 2 (two) times daily. 05/25/20  Yes Kathie Dike, MD  diclofenac Sodium (VOLTAREN) 1 % GEL Apply topically 4 (four) times daily.   Yes [provider]  furosemide (LASIX) 40 MG tablet Take 1 tablet (40 mg total) by mouth daily  as needed for fluid or edema. 07/28/20  Yes Karamalegos, Alexander J, DO  gabapentin (NEURONTIN) 100 MG capsule TAKE 1 CAPSULE BY MOUTH ONCE DAILY THEN INCREASE EVERY 2 TO 3 DAYS UP TO TAKING THREE TIMES DAILY OR 3 CAPSULES AT BEDTIME Patient taking differently: Take 300 mg by mouth at bedtime. 05/28/20  Yes Karamalegos, Devonne Doughty, DO  levocetirizine (XYZAL) 5 MG tablet Take 0.5 tablets (2.5 mg total) by mouth every other day. 05/17/19  Yes Mikey College, NP  Melatonin 10 MG CAPS Take 10 mg by mouth at bedtime. 06/26/20 09/24/20 Yes Milinda Pointer, MD  mupirocin ointment (BACTROBAN) 2 % Apply  1 application topically 2 (two) times daily as needed. For up to 7-10 days as needed to help heal and prevent infection. 04/01/20  Yes Karamalegos, Devonne Doughty, DO  Oxycodone HCl 20 MG TABS Take 1 tablet (20 mg total) by mouth every 8 (eight) hours as needed. Must last 30 days 07/26/20 08/25/20 Yes Milinda Pointer, MD  OXYGEN Inhale 2 L into the lungs at bedtime.    Yes [provider]  rOPINIRole (REQUIP) 2 MG tablet Take 2 tablets (4 mg total) by mouth at bedtime. 07/04/20  Yes Karamalegos, Devonne Doughty, DO  Wheat Dextrin (BENEFIBER) POWD Take 6 g by mouth 3 (three) times daily before meals. (2 tsp = 6 g) 08/01/19 07/31/20 Yes Milinda Pointer, MD  Oxycodone HCl 20 MG TABS Take 1 tablet (20 mg total) by mouth every 8 (eight) hours as needed. Must last 30 days 06/26/20 07/26/20  Milinda Pointer, MD  Oxycodone HCl 20 MG TABS Take 1 tablet (20 mg total) by mouth every 8 (eight) hours as needed. Must last 30 days 08/25/20 09/24/20  Milinda Pointer, MD  polyethylene glycol (MIRALAX / GLYCOLAX) 17 g packet Take 17 g by mouth daily as needed for moderate constipation. 07/04/20   Karamalegos, Devonne Doughty, DO    Allergies    Flexeril [cyclobenzaprine hcl] and Vancomycin  Review of Systems   Review of Systems  All other systems reviewed and are negative.   Physical Exam Updated Vital Signs BP  139/67 (BP Location: Right Arm)    Pulse 63    Temp 99 F (37.2 C) (Oral)    Resp 18    Ht 1.702 m (5\' 7" )    Wt 72.6 kg    SpO2 97%    BMI 25.06 kg/m   Physical Exam Vitals and nursing note reviewed.  Constitutional:      General: She is not in acute distress.    Appearance: She is well-developed and well-nourished.  HENT:     Head: Normocephalic and atraumatic.     Mouth/Throat:     Mouth: Oropharynx is clear and moist.     Pharynx: No oropharyngeal exudate.  Eyes:     General: No scleral icterus.       Right eye: No discharge.        Left eye: No discharge.     Extraocular Movements: EOM normal.     Conjunctiva/sclera: Conjunctivae normal.     Pupils: Pupils are equal, round, and reactive to light.  Neck:     Thyroid: No thyromegaly.     Vascular: No JVD.  Cardiovascular:     Rate and Rhythm: Normal rate and regular rhythm.     Pulses: Intact distal pulses.     Heart sounds: Normal heart sounds. No murmur heard. No friction rub. No gallop.   Pulmonary:     Effort: Pulmonary effort is normal. No respiratory distress.     Breath sounds: Rales present. No wheezing.     Comments: Rales at the right base Abdominal:     General: Bowel sounds are normal. There is no distension.     Palpations: Abdomen is soft. There is no mass.     Tenderness: There is no abdominal tenderness.  Musculoskeletal:        General: No tenderness. Normal range of motion.     Cervical back: Normal range of motion and neck supple.     Right lower leg: Edema present.     Left lower leg: Edema present.  Lymphadenopathy:  Cervical: No cervical adenopathy.  Skin:    General: Skin is warm and dry.     Findings: No erythema or rash.  Neurological:     General: No focal deficit present.     Mental Status: She is alert.     Coordination: Coordination normal.  Psychiatric:        Mood and Affect: Mood and affect normal.        Behavior: Behavior normal.     ED Results / Procedures / Treatments    Labs (all labs ordered are listed, but only abnormal results are displayed) Labs Reviewed  CBC WITH DIFFERENTIAL/PLATELET - Abnormal; Notable for the following components:      Result Value   WBC 18.0 (*)    Platelets 125 (*)    Neutro Abs 15.0 (*)    Abs Immature Granulocytes 0.11 (*)    All other components within normal limits  BRAIN NATRIURETIC PEPTIDE - Abnormal; Notable for the following components:   B Natriuretic Peptide 1,540.0 (*)    All other components within normal limits  BASIC METABOLIC PANEL - Abnormal; Notable for the following components:   BUN 25 (*)    Creatinine, Ser 1.22 (*)    GFR, Estimated 43 (*)    All other components within normal limits  RESP PANEL BY RT-PCR (FLU A&B, COVID) [id number]  LACTIC ACID, PLASMA    EKG EKG Interpretation  Date/Time:  Saturday August 02 2020 12:48:40 EST Ventricular Rate:  61 PR Interval:    QRS Duration: 138 QT Interval:  506 QTC Calculation: 509 R Axis:   -68 Text Interpretation: Atrial-paced rhythm with prolonged AV conduction Left axis deviation Right bundle branch block Possible Anteroseptal infarct , age undetermined T wave abnormality, consider inferolateral ischemia Abnormal ECG Confirmed by Aletta Edouard [phone]) on 08/02/2020 12:59:41 PM   Radiology DG Chest Portable 1 View  Result Date: 08/02/2020 CLINICAL DATA:  Shortness of breath, fever. EXAM: PORTABLE CHEST 1 VIEW COMPARISON:  July 16, 2020. FINDINGS: Stable cardiomediastinal silhouette. Left-sided pacemaker is unchanged in position. No pneumothorax is noted. Status post bilateral shoulder arthroplasties. Stable interstitial opacities are noted throughout both lungs which may represent edema and some degree of chronic interstitial lung disease. Increased right basilar atelectasis or infiltrate is noted with probable small right pleural effusion. IMPRESSION: Increased right basilar atelectasis or infiltrate is noted with probable small right pleural  effusion. Stable interstitial opacities are noted throughout both lungs which may represent edema and some degree of chronic interstitial lung disease. Electronically Signed   By: Marijo Conception M.D.   On: 08/02/2020 13:30    Procedures Procedures (including critical care time)  Medications Ordered in ED Medications  cefTRIAXone (ROCEPHIN) 1 g in sodium chloride 0.9 % 100 mL IVPB (1 g Intravenous New Bag/Given 08/02/20 2248)  azithromycin (ZITHROMAX) tablet 500 mg (500 mg Oral Given 08/02/20 2249)    ED Course  I have reviewed the triage vital signs and the nursing notes.  Pertinent labs & imaging results that were available during my care of the patient were reviewed by me and considered in my medical decision making (see chart for details).    MDM Rules/Calculators/A&P                          This patient appears to have a recurrent pneumonia at the right base.  I personally looked at the x-ray and there does appear to be increased infiltrate in that location.  This is consistent with her temperature of 100.5, she is not tachycardic however she has a pacemaker, her oxygen levels are adequate on 3 L, she will need to ambulate to see if she decompensates.  She is not coughing throughout the exam and does not appear to be in distress  The patient does get hypoxic with any type of walking.  She has a leukocytosis of 18,000, BNP of 1500 and a chest x-ray showing right lower lobe pneumonia.  She does get hypoxic when she tries to ambulate.  Will discuss with hospitalist  Discussed with hospitalist who has been kind of treatment.  Rocephin and Zithromax given for infection  Kristin [name] was evaluated in Emergency Department on 08/02/2020 for the symptoms described in the history of present illness. She was evaluated in the context of the global COVID-19 pandemic, which necessitated consideration that the patient might be at risk for infection with the SARS-CoV-2 virus that causes  COVID-19. Institutional protocols and algorithms that pertain to the evaluation of patients at risk for COVID-19 are in a state of rapid change based on information released by regulatory bodies including the CDC and federal and state organizations. These policies and algorithms were followed during the patient's care in the ED.   Final Clinical Impression(s) / ED Diagnoses Final diagnoses:  Community acquired pneumonia of right lower lobe of lung    Rx / DC Orders ED Discharge Orders    None       Noemi Chapel, MD 08/02/20 2303    Noemi Chapel, MD 08/02/20 2303

## 2020-08-02 NOTE — H&P (Addendum)
TRH H&P    Patient Demographics:    Kristin [name], is a 86 y.o. female  MRN: [id number]  DOB - [birth year]  Admit Date - 08/02/2020  Referring MD/NP/PA: Sabra Heck  Outpatient Primary MD for the patient is Olin Hauser, DO  Patient coming from: Home  Chief complaint- dyspnea   HPI:    Kristin [name]  is a 86 y.o. female, with history of restless leg, overactive bladder, hypertension, hyperlipidemia, hepatitis C, gout, COPD, CKD, CHF, breast cancer, atrial fibrillation, and more presents to the ER with a chief complaint of dyspnea.  Patient reports it was acute onset yesterday, and has been progressively worse since.  She wears 2 L nasal cannula at baseline for COPD, and has not attempted to increase her oxygen at home.  She reports her shortness of breath is worse with exertion, better with rest. She reports a cough productive of clear sputum.  She has had subjective fevers at home.  She reports a mild chest pain on the right side of her chest, she cannot characterize it.  She reports nothing makes it worse and nothing makes it better.  It is not worse with deep inspiration specifically.  She took Tylenol and it was improved.  Patient states that she is also had leg pain that has been going on for 2 weeks.  Standing on her legs makes the pain worse.  Rest makes it better.  She is taking Tylenol but it has not helped.  She has associated bilateral lower extremity edema per her report-not seen on exam.  She takes 40 mg of Lasix at home every day, reports that it has not helped with the edema.  Patient has no other complaints at this time.  Patient does not smoke, does not drink, does not use illicit drugs.  She has had her first 2 Covid vaccines but no booster.  She is full code.  In the ED Temp 100.5, heart rate 57-64, blood pressure 139/67, O2 sats as low as 89% on her baseline 2 L nasal cannula, increased to 3 L to  maintain mid 90s oxygen saturations.  white blood 18, hemoglobin 13 CHEM panel reveals BUN 25, creatinine 1.22, at baseline BNP is up to 1548 from 900 Lactic acid is 1.8 Chest x-ray shows possible infiltrate right lower lobe, possible edema versus interstitial changes EKG shows a heart rate of 61, QTc 509, atrial paced Admission requested for further management of acute hypoxic respiratory failure and community-acquired pneumonia   Review of systems:    In addition to the HPI above,  Reports subjective fevers No Headache, No changes with Vision or hearing, No problems swallowing food or Liquids, No Chest pain, reports cough and shortness of breath No Abdominal pain, No Nausea or Vomiting, bowel movements are regular, No Blood in stool or Urine, No dysuria, No new skin rashes or bruises, No new joints pain, reports pain in the bilateral lower extremities for 2 weeks No new weakness, tingling, numbness in any extremity, No recent weight gain or loss, No polyuria, polydypsia or polyphagia,  No significant Mental Stressors.  All other systems reviewed and are negative.    Past History of the following :    Past Medical History:  Diagnosis Date  . Acute postoperative pain 02/09/2018  . Arrhythmia, sinus node 04/03/2014  . Arthritis   . Arthritis   . Atrial fibrillation ([place])   . Back pain    lower back chronic  . Bradycardia   . Breast cancer ([place]) 2004   right breast cancer  . Cancer [facility]) 2004   rt breast cancer-post lumpectomy- chemo/rad  . CHF (congestive heart failure) ([place])   . Chronic back pain   . CKD (chronic kidney disease)   . COPD (chronic obstructive pulmonary disease) (HCC)    wears O2 at 2L via [place] at night  . Cystocele   . Decubitus ulcers   . Dehydration   . Gout   . Hematuria   . Hepatitis C   . Hyperlipidemia   . Hypertension   . Hypomagnesemia 06/25/2015  . OAB (overactive bladder)   . Overactive bladder   . Restless leg   . Shoulder pain,  bilateral   . Urinary frequency   . UTI (lower urinary tract infection)   . Vaginal atrophy       Past Surgical History:  Procedure Laterality Date  . ABDOMINAL HYSTERECTOMY    . BACK SURGERY    . BREAST LUMPECTOMY Right 2004  . BREAST SURGERY     rt lumpectomy  . CARPAL TUNNEL RELEASE    . CATARACT EXTRACTION W/PHACO  10/19/2011   Procedure: CATARACT EXTRACTION PHACO AND INTRAOCULAR LENS PLACEMENT (IOC);  Surgeon: Elta Guadeloupe T. Gershon Crane, MD;  Location: [facility];  Service: Ophthalmology;  Laterality: Right;  CDE:10.81  . CATARACT EXTRACTION W/PHACO  11/02/2011   Procedure: CATARACT EXTRACTION PHACO AND INTRAOCULAR LENS PLACEMENT (IOC);  Surgeon: Elta Guadeloupe T. Gershon Crane, MD;  Location: [facility];  Service: Ophthalmology;  Laterality: Left;  CDE 8.60  . CHOLECYSTECTOMY     3/18  . COLON SURGERY    . INSERT / REPLACE / REMOVE PACEMAKER    . JOINT REPLACEMENT     bilateral TKA  . PACEMAKER INSERTION N/A 12/24/2015   Procedure: INSERTION PACEMAKER;  Surgeon: Isaias Cowman, MD;  Location: [facility] ORS;  Service: Cardiovascular;  Laterality: N/A;  . ROTATOR CUFF REPAIR     bilateral      Social History:      Social History   Tobacco Use  . Smoking status: Former Smoker    Packs/day: 1.25    Years: 40.00    Pack years: 50.00    Types: Cigarettes    Quit date: 12/16/1998    Years since quitting: 21.6  . Smokeless tobacco: Former Network engineer Use Topics  . Alcohol use: No    Alcohol/week: 0.0 standard drinks       Family History :     Family History  Problem Relation Age of Onset  . Kidney disease Brother   . Heart disease Father   . Heart disease Mother   . Stroke Mother   . Cancer Sister   . Breast cancer Sister 86  . Breast cancer Paternal Aunt   . Anesthesia problems Neg Hx   . Hypotension Neg Hx   . Malignant hyperthermia Neg Hx   . Pseudochol deficiency Neg Hx       Home Medications:   Prior to Admission medications   Medication Sig Start Date End Date Taking?  Authorizing Provider  acetaminophen (TYLENOL) 500  MG tablet Take 500 mg by mouth 3 (three) times daily.   Yes [provider]  albuterol (PROVENTIL) (2.5 MG/3ML) 0.083% nebulizer solution Take 3 mLs (2.5 mg total) by nebulization every 8 (eight) hours as needed for wheezing or shortness of breath. 10/02/19 10/01/20 Yes Karamalegos, Devonne Doughty, DO  apixaban (ELIQUIS) 5 MG TABS tablet Take 1 tablet (5 mg total) by mouth 2 (two) times daily. Patient taking differently: Take 5 mg by mouth 2 (two) times daily. For stroke prevention 10/30/15  Yes Wieting, [name], MD  bisacodyl (DULCOLAX) 5 MG EC tablet Take 2 tablets (10 mg total) by mouth daily as needed for moderate constipation. 07/04/20  Yes Karamalegos, Devonne Doughty, DO  Calcium Carbonate-Vitamin D 600-200 MG-UNIT TABS Take by mouth.   Yes [provider]  Cholecalciferol (VITAMIN D) 2000 units tablet Take 2,000 Units by mouth daily.    Yes [provider]  dextromethorphan-guaiFENesin (MUCINEX DM) 30-600 MG 12hr tablet Take 1 tablet by mouth 2 (two) times daily. 05/25/20  Yes Kathie Dike, MD  diclofenac Sodium (VOLTAREN) 1 % GEL Apply topically 4 (four) times daily.   Yes [provider]  furosemide (LASIX) 40 MG tablet Take 1 tablet (40 mg total) by mouth daily as needed for fluid or edema. 07/28/20  Yes Karamalegos, Alexander J, DO  gabapentin (NEURONTIN) 100 MG capsule TAKE 1 CAPSULE BY MOUTH ONCE DAILY THEN INCREASE EVERY 2 TO 3 DAYS UP TO TAKING THREE TIMES DAILY OR 3 CAPSULES AT BEDTIME Patient taking differently: Take 300 mg by mouth at bedtime. 05/28/20  Yes Karamalegos, Devonne Doughty, DO  levocetirizine (XYZAL) 5 MG tablet Take 0.5 tablets (2.5 mg total) by mouth every other day. 05/17/19  Yes Mikey College, NP  Melatonin 10 MG CAPS Take 10 mg by mouth at bedtime. 06/26/20 09/24/20 Yes Milinda Pointer, MD  mupirocin ointment (BACTROBAN) 2 % Apply 1 application topically 2 (two) times daily as needed. For  up to 7-10 days as needed to help heal and prevent infection. 04/01/20  Yes Karamalegos, Devonne Doughty, DO  Oxycodone HCl 20 MG TABS Take 1 tablet (20 mg total) by mouth every 8 (eight) hours as needed. Must last 30 days 07/26/20 08/25/20 Yes Milinda Pointer, MD  OXYGEN Inhale 2 L into the lungs at bedtime.    Yes [provider]  rOPINIRole (REQUIP) 2 MG tablet Take 2 tablets (4 mg total) by mouth at bedtime. 07/04/20  Yes Karamalegos, Devonne Doughty, DO  Wheat Dextrin (BENEFIBER) POWD Take 6 g by mouth 3 (three) times daily before meals. (2 tsp = 6 g) 08/01/19 07/31/20 Yes Milinda Pointer, MD  Oxycodone HCl 20 MG TABS Take 1 tablet (20 mg total) by mouth every 8 (eight) hours as needed. Must last 30 days 06/26/20 07/26/20  Milinda Pointer, MD  Oxycodone HCl 20 MG TABS Take 1 tablet (20 mg total) by mouth every 8 (eight) hours as needed. Must last 30 days 08/25/20 09/24/20  Milinda Pointer, MD  polyethylene glycol (MIRALAX / GLYCOLAX) 17 g packet Take 17 g by mouth daily as needed for moderate constipation. 07/04/20   Olin Hauser, DO     Allergies:     Allergies  Allergen Reactions  . Flexeril [Cyclobenzaprine Hcl]     Severe Rash  . Vancomycin Rash    [place] Syndrome     Physical Exam:   Vitals  Blood pressure 119/67, pulse (!) 120, temperature 99 F (37.2 C), temperature source Oral, resp. rate 18, height 5'  7" (1.702 m), weight 72.6 kg, SpO2 98 %.  1.  General: Lying supine in bed in no acute distress  2. Psychiatric: Mood and behavior normal for situation, cooperative with exam  3. Neurologic: Face is symmetric, speech and language are normal, moves all 4 extremities voluntarily, no focal deficit on limited exam  4. HEENMT:  Head is atraumatic, normocephalic, pupils reactive to light, neck is supple, trachea is midline, mucous membranes are moist  5. Respiratory : Lungs with bilateral rhonchi, no wheeze, no rales  6. Cardiovascular : Heart rate is  normal, rhythm is regular, no murmurs rubs or gallops  7. Gastrointestinal:  Abdomen is soft, nondistended, nontender to palpation  8. Skin:  Skin is warm dry and intact  9.Musculoskeletal:  No acute deformity, no calf tenderness, peripheral pulses palpated, trace edema at the ankles    Data Review:    CBC Recent Labs  Lab 08/02/20 2014  WBC 18.0*  HGB 13.0  HCT 42.0  PLT 125*  MCV 97.2  MCH 30.1  MCHC 31.0  RDW 14.3  LYMPHSABS 2.2  MONOABS 0.5  EOSABS 0.1  BASOSABS 0.1   ------------------------------------------------------------------------------------------------------------------  Results for orders placed or performed during the hospital encounter of 08/02/20 (from the past 48 hour(s))  Basic metabolic panel     Status: Abnormal   Collection Time: 08/02/20  5:03 PM  Result Value Ref Range   Sodium 138 135 - 145 mmol/L   Potassium 3.8 3.5 - 5.1 mmol/L   Chloride 103 98 - 111 mmol/L   CO2 23 22 - 32 mmol/L   Glucose, Bld 84 70 - 99 mg/dL    Comment: Glucose reference range applies only to samples taken after fasting for at least 8 hours.   BUN 25 (H) 8 - 23 mg/dL   Creatinine, Ser 1.22 (H) 0.44 - 1.00 mg/dL   Calcium 9.1 8.9 - 10.3 mg/dL   GFR, Estimated 43 (L) >60 mL/min    Comment: (NOTE) Calculated using the CKD-EPI Creatinine Equation (2021)    Anion gap 12 5 - 15    Comment: Performed at [facility], [address] [place], [place] [address]  CBC with Differential     Status: Abnormal   Collection Time: 08/02/20  8:14 PM  Result Value Ref Range   WBC 18.0 (H) 4.0 - 10.5 K/uL   RBC 4.32 3.87 - 5.11 MIL/uL   Hemoglobin 13.0 12.0 - 15.0 g/dL   HCT 42.0 36.0 - 46.0 %   MCV 97.2 80.0 - 100.0 fL   MCH 30.1 26.0 - 34.0 pg   MCHC 31.0 30.0 - 36.0 g/dL   RDW 14.3 11.5 - 15.5 %   Platelets 125 (L) 150 - 400 K/uL   nRBC 0.0 0.0 - 0.2 %   Neutrophils Relative % 83 %   Neutro Abs 15.0 (H) 1.7 - 7.7 K/uL   Lymphocytes Relative 13 %   Lymphs Abs 2.2 0.7  - 4.0 K/uL   Monocytes Relative 3 %   Monocytes Absolute 0.5 0.1 - 1.0 K/uL   Eosinophils Relative 0 %   Eosinophils Absolute 0.1 0.0 - 0.5 K/uL   Basophils Relative 0 %   Basophils Absolute 0.1 0.0 - 0.1 K/uL   Immature Granulocytes 1 %   Abs Immature Granulocytes 0.11 (H) 0.00 - 0.07 K/uL    Comment: Performed at [facility], [address], [place], [place] [address]  Brain natriuretic peptide     Status: Abnormal   Collection Time: 08/02/20  8:14 PM  Result Value Ref Range   B Natriuretic Peptide 1,540.0 (H) 0.0 - 100.0 pg/mL    Comment: Performed at [facility], [address] [place], [place] [address]  Lactic acid, plasma     Status: None   Collection Time: 08/02/20  8:14 PM  Result Value Ref Range   Lactic Acid, Venous 1.8 0.5 - 1.9 mmol/L    Comment: Performed at [facility], [address] [place], [place] [address]    Chemistries  Recent Labs  Lab 08/02/20 1703  NA 138  K 3.8  CL 103  CO2 23  GLUCOSE 84  BUN 25*  CREATININE 1.22*  CALCIUM 9.1   ------------------------------------------------------------------------------------------------------------------  ------------------------------------------------------------------------------------------------------------------ GFR: Estimated Creatinine Clearance: 31 mL/min (A) (by C-G formula based on SCr of 1.22 mg/dL (H)). Liver Function Tests: No results for input(s): AST, ALT, ALKPHOS, BILITOT, PROT, ALBUMIN in the last 168 hours. No results for input(s): LIPASE, AMYLASE in the last 168 hours. No results for input(s): AMMONIA in the last 168 hours. Coagulation Profile: No results for input(s): INR, PROTIME in the last 168 hours. Cardiac Enzymes: No results for input(s): CKTOTAL, CKMB, CKMBINDEX, TROPONINI in the last 168 hours. BNP (last 3 results) No results for input(s): PROBNP in the last 8760 hours. HbA1C: No results for input(s): HGBA1C in the last 72 hours. CBG: No results for input(s): GLUCAP in  the last 168 hours. Lipid Profile: No results for input(s): CHOL, HDL, LDLCALC, TRIG, CHOLHDL, LDLDIRECT in the last 72 hours. Thyroid Function Tests: No results for input(s): TSH, T4TOTAL, FREET4, T3FREE, THYROIDAB in the last 72 hours. Anemia Panel: No results for input(s): VITAMINB12, FOLATE, FERRITIN, TIBC, IRON, RETICCTPCT in the last 72 hours.  --------------------------------------------------------------------------------------------------------------- Urine analysis:    Component Value Date/Time   COLORURINE YELLOW 05/24/2020 0115   APPEARANCEUR HAZY (A) 05/24/2020 0115   APPEARANCEUR Clear 09/03/2015 1036   LABSPEC 1.017 05/24/2020 0115   PHURINE 6.0 05/24/2020 0115   GLUCOSEU NEGATIVE 05/24/2020 0115   HGBUR SMALL (A) 05/24/2020 0115   BILIRUBINUR NEGATIVE 05/24/2020 0115   BILIRUBINUR neg 11/08/2016 1519   BILIRUBINUR Negative 09/03/2015 1036   KETONESUR NEGATIVE 05/24/2020 0115   PROTEINUR 30 (A) 05/24/2020 0115   UROBILINOGEN 0.2 11/08/2016 1519   UROBILINOGEN 0.2 02/17/2011 1050   NITRITE NEGATIVE 05/24/2020 0115   LEUKOCYTESUR MODERATE (A) 05/24/2020 0115      Imaging Results:    DG Chest Portable 1 View  Result Date: 08/02/2020 CLINICAL DATA:  Shortness of breath, fever. EXAM: PORTABLE CHEST 1 VIEW COMPARISON:  July 16, 2020. FINDINGS: Stable cardiomediastinal silhouette. Left-sided pacemaker is unchanged in position. No pneumothorax is noted. Status post bilateral shoulder arthroplasties. Stable interstitial opacities are noted throughout both lungs which may represent edema and some degree of chronic interstitial lung disease. Increased right basilar atelectasis or infiltrate is noted with probable small right pleural effusion. IMPRESSION: Increased right basilar atelectasis or infiltrate is noted with probable small right pleural effusion. Stable interstitial opacities are noted throughout both lungs which may represent edema and some degree of chronic  interstitial lung disease. Electronically Signed   By: Marijo Conception M.D.   On: 08/02/2020 13:30       Assessment & Plan:    Active Problems:   Acute respiratory failure with hypoxia (HCC)   1. Acute respiratory failure with hypoxia 1. Likely multifactorial with pneumonia and some vascular congestion in the lungs 2. Wean back down to baseline 2 L nasal cannula as tolerated 3. Continue with  neb treatments as needed 4. Monitor on telemetry, pulse ox with vitals 2. Community acquired pneumonia 1. Cough, shortness of breath, leukocytosis, infiltrate on chest x-ray 2. Rocephin and Zithromax started in the ED 3. Continue Rocephin and Zithromax 4. Sputum culture, Legionella and strep antigens 5. Continue neb treatments as needed 3. Prolonged QT 1. QTC 509 2. Pacemaker in place 3. Monitor on telemetry 4. Acute on chronic CHF 1. Peripheral edema at the ankles, vascular congestion on checks x-ray, pleural effusion 2. Last echo was October 2021 and showed an ejection fraction of 60 to 65% with grade 1 diastolic dysfunction 3. Daily weights 4. Monitor intake and output 5. Fluid restriction with diet 6. Dose of IV Lasix normal p.o. dose of Lasix tomorrow 5.    DVT Prophylaxis-   Eliquis- SCDs   AM Labs Ordered, also please review Full Orders  Family Communication: No family at bedside Code Status: Full  Admission status: Observation.  Time spent in minutes : [place]

## 2020-08-02 NOTE — ED Notes (Signed)
EKG done and handed Dr. Melina Copa

## 2020-08-03 ENCOUNTER — Observation Stay (HOSPITAL_COMMUNITY): Payer: Medicare HMO

## 2020-08-03 ENCOUNTER — Encounter (HOSPITAL_COMMUNITY): Payer: Self-pay | Admitting: Family Medicine

## 2020-08-03 DIAGNOSIS — Z888 Allergy status to other drugs, medicaments and biological substances status: Secondary | ICD-10-CM | POA: Diagnosis not present

## 2020-08-03 DIAGNOSIS — G2581 Restless legs syndrome: Secondary | ICD-10-CM | POA: Diagnosis present

## 2020-08-03 DIAGNOSIS — Z90710 Acquired absence of both cervix and uterus: Secondary | ICD-10-CM | POA: Diagnosis not present

## 2020-08-03 DIAGNOSIS — Z841 Family history of disorders of kidney and ureter: Secondary | ICD-10-CM | POA: Diagnosis not present

## 2020-08-03 DIAGNOSIS — N3281 Overactive bladder: Secondary | ICD-10-CM | POA: Diagnosis present

## 2020-08-03 DIAGNOSIS — I130 Hypertensive heart and chronic kidney disease with heart failure and stage 1 through stage 4 chronic kidney disease, or unspecified chronic kidney disease: Secondary | ICD-10-CM | POA: Diagnosis present

## 2020-08-03 DIAGNOSIS — Z8616 Personal history of COVID-19: Secondary | ICD-10-CM | POA: Diagnosis not present

## 2020-08-03 DIAGNOSIS — Z8249 Family history of ischemic heart disease and other diseases of the circulatory system: Secondary | ICD-10-CM | POA: Diagnosis not present

## 2020-08-03 DIAGNOSIS — G8929 Other chronic pain: Secondary | ICD-10-CM | POA: Diagnosis present

## 2020-08-03 DIAGNOSIS — J440 Chronic obstructive pulmonary disease with acute lower respiratory infection: Secondary | ICD-10-CM | POA: Diagnosis present

## 2020-08-03 DIAGNOSIS — Z9049 Acquired absence of other specified parts of digestive tract: Secondary | ICD-10-CM | POA: Diagnosis not present

## 2020-08-03 DIAGNOSIS — Z87891 Personal history of nicotine dependence: Secondary | ICD-10-CM | POA: Diagnosis not present

## 2020-08-03 DIAGNOSIS — R0602 Shortness of breath: Secondary | ICD-10-CM | POA: Diagnosis present

## 2020-08-03 DIAGNOSIS — E785 Hyperlipidemia, unspecified: Secondary | ICD-10-CM | POA: Diagnosis present

## 2020-08-03 DIAGNOSIS — J9621 Acute and chronic respiratory failure with hypoxia: Secondary | ICD-10-CM | POA: Diagnosis present

## 2020-08-03 DIAGNOSIS — Z881 Allergy status to other antibiotic agents status: Secondary | ICD-10-CM | POA: Diagnosis not present

## 2020-08-03 DIAGNOSIS — Z7901 Long term (current) use of anticoagulants: Secondary | ICD-10-CM | POA: Diagnosis not present

## 2020-08-03 DIAGNOSIS — J9811 Atelectasis: Secondary | ICD-10-CM | POA: Diagnosis present

## 2020-08-03 DIAGNOSIS — Z853 Personal history of malignant neoplasm of breast: Secondary | ICD-10-CM | POA: Diagnosis not present

## 2020-08-03 DIAGNOSIS — Z79899 Other long term (current) drug therapy: Secondary | ICD-10-CM | POA: Diagnosis not present

## 2020-08-03 DIAGNOSIS — J189 Pneumonia, unspecified organism: Secondary | ICD-10-CM | POA: Diagnosis present

## 2020-08-03 DIAGNOSIS — Z20822 Contact with and (suspected) exposure to covid-19: Secondary | ICD-10-CM | POA: Diagnosis present

## 2020-08-03 DIAGNOSIS — Z96653 Presence of artificial knee joint, bilateral: Secondary | ICD-10-CM | POA: Diagnosis present

## 2020-08-03 DIAGNOSIS — I5033 Acute on chronic diastolic (congestive) heart failure: Secondary | ICD-10-CM | POA: Diagnosis present

## 2020-08-03 DIAGNOSIS — R918 Other nonspecific abnormal finding of lung field: Secondary | ICD-10-CM | POA: Diagnosis not present

## 2020-08-03 DIAGNOSIS — J9601 Acute respiratory failure with hypoxia: Secondary | ICD-10-CM | POA: Diagnosis not present

## 2020-08-03 DIAGNOSIS — J849 Interstitial pulmonary disease, unspecified: Secondary | ICD-10-CM | POA: Diagnosis present

## 2020-08-03 LAB — CBC WITH DIFFERENTIAL/PLATELET
Abs Immature Granulocytes: 0.09 10*3/uL — ABNORMAL HIGH (ref 0.00–0.07)
Basophils Absolute: 0.1 10*3/uL (ref 0.0–0.1)
Basophils Relative: 0 %
Eosinophils Absolute: 0.1 10*3/uL (ref 0.0–0.5)
Eosinophils Relative: 0 %
HCT: 46 % (ref 36.0–46.0)
Hemoglobin: 13.7 g/dL (ref 12.0–15.0)
Immature Granulocytes: 1 %
Lymphocytes Relative: 13 %
Lymphs Abs: 2 10*3/uL (ref 0.7–4.0)
MCH: 29 pg (ref 26.0–34.0)
MCHC: 29.8 g/dL — ABNORMAL LOW (ref 30.0–36.0)
MCV: 97.3 fL (ref 80.0–100.0)
Monocytes Absolute: 0.5 10*3/uL (ref 0.1–1.0)
Monocytes Relative: 4 %
Neutro Abs: 12.5 10*3/uL — ABNORMAL HIGH (ref 1.7–7.7)
Neutrophils Relative %: 82 %
Platelets: 131 10*3/uL — ABNORMAL LOW (ref 150–400)
RBC: 4.73 MIL/uL (ref 3.87–5.11)
RDW: 14.3 % (ref 11.5–15.5)
WBC: 15.2 10*3/uL — ABNORMAL HIGH (ref 4.0–10.5)
nRBC: 0 % (ref 0.0–0.2)

## 2020-08-03 LAB — COMPREHENSIVE METABOLIC PANEL
ALT: 22 U/L (ref 0–44)
AST: 31 U/L (ref 15–41)
Albumin: 3.7 g/dL (ref 3.5–5.0)
Alkaline Phosphatase: 68 U/L (ref 38–126)
Anion gap: 12 (ref 5–15)
BUN: 22 mg/dL (ref 8–23)
CO2: 27 mmol/L (ref 22–32)
Calcium: 9.4 mg/dL (ref 8.9–10.3)
Chloride: 101 mmol/L (ref 98–111)
Creatinine, Ser: 1.07 mg/dL — ABNORMAL HIGH (ref 0.44–1.00)
GFR, Estimated: 50 mL/min — ABNORMAL LOW (ref >60)
Glucose, Bld: 94 mg/dL (ref 70–99)
Potassium: 3.4 mmol/L — ABNORMAL LOW (ref 3.5–5.1)
Sodium: 140 mmol/L (ref 135–145)
Total Bilirubin: 1.6 mg/dL — ABNORMAL HIGH (ref 0.3–1.2)
Total Protein: 7.4 g/dL (ref 6.5–8.1)

## 2020-08-03 LAB — HIV ANTIBODY (ROUTINE TESTING W REFLEX): HIV Screen 4th Generation wRfx: NONREACTIVE

## 2020-08-03 LAB — MAGNESIUM: Magnesium: 2 mg/dL (ref 1.7–2.4)

## 2020-08-03 MED ORDER — OXYCODONE HCL 5 MG PO TABS: 20.0000 mg | ORAL_TABLET | Freq: Three times a day (TID) | ORAL | Status: DC | PRN

## 2020-08-03 MED ORDER — FUROSEMIDE 40 MG PO TABS: 40.0000 mg | ORAL_TABLET | Freq: Every day | ORAL | Status: DC | PRN

## 2020-08-03 MED ORDER — DM-GUAIFENESIN ER 30-600 MG PO TB12
1.0000 | ORAL_TABLET | Freq: Two times a day (BID) | ORAL | Status: DC
  Administered 2020-08-03 – 2020-08-05 (×5): 1 via ORAL
  Filled 2020-08-03 (×5): qty 1

## 2020-08-03 MED ORDER — MELATONIN 5 MG PO TABS
10.0000 mg | ORAL_TABLET | Freq: Every day | ORAL | Status: DC
  Filled 2020-08-03 (×2): qty 2

## 2020-08-03 MED ORDER — ALBUTEROL SULFATE (2.5 MG/3ML) 0.083% IN NEBU: 2.5000 mg | INHALATION_SOLUTION | Freq: Three times a day (TID) | RESPIRATORY_TRACT | Status: DC | PRN

## 2020-08-03 MED ORDER — ACETAMINOPHEN 325 MG PO TABS: 650.0000 mg | ORAL_TABLET | Freq: Four times a day (QID) | ORAL | Status: DC | PRN

## 2020-08-03 MED ORDER — APIXABAN 5 MG PO TABS
5.0000 mg | ORAL_TABLET | Freq: Two times a day (BID) | ORAL | Status: DC
  Administered 2020-08-03 – 2020-08-05 (×5): 5 mg via ORAL
  Filled 2020-08-03 (×5): qty 1

## 2020-08-03 MED ORDER — ROPINIROLE HCL 1 MG PO TABS
4.0000 mg | ORAL_TABLET | Freq: Every day | ORAL | Status: DC
  Administered 2020-08-03 – 2020-08-04 (×2): 4 mg via ORAL
  Filled 2020-08-03 (×3): qty 4

## 2020-08-03 MED ORDER — FUROSEMIDE 40 MG PO TABS
40.0000 mg | ORAL_TABLET | Freq: Two times a day (BID) | ORAL | Status: DC
Start: 2020-08-03 — End: 2020-08-05
  Administered 2020-08-03 – 2020-08-05 (×5): 40 mg via ORAL
  Filled 2020-08-03 (×5): qty 1

## 2020-08-03 MED ORDER — POTASSIUM CHLORIDE CRYS ER 20 MEQ PO TBCR
20.0000 meq | EXTENDED_RELEASE_TABLET | Freq: Two times a day (BID) | ORAL | Status: AC
  Administered 2020-08-03 – 2020-08-04 (×4): 20 meq via ORAL
  Filled 2020-08-03 (×4): qty 1

## 2020-08-03 MED ORDER — GABAPENTIN 400 MG PO CAPS
400.0000 mg | ORAL_CAPSULE | Freq: Every day | ORAL | Status: DC
  Administered 2020-08-03 – 2020-08-04 (×2): 400 mg via ORAL
  Filled 2020-08-03 (×2): qty 1

## 2020-08-03 MED ORDER — SODIUM CHLORIDE 0.9 % IV SOLN: 500.0000 mg | INTRAVENOUS | Status: DC

## 2020-08-03 MED ORDER — MELATONIN 3 MG PO TABS
9.0000 mg | ORAL_TABLET | Freq: Every day | ORAL | Status: DC
Start: 2020-08-03 — End: 2020-08-05
  Administered 2020-08-03 – 2020-08-04 (×2): 9 mg via ORAL
  Filled 2020-08-03 (×2): qty 3

## 2020-08-03 MED ORDER — BISACODYL 5 MG PO TBEC
10.0000 mg | DELAYED_RELEASE_TABLET | Freq: Every day | ORAL | Status: DC | PRN
  Administered 2020-08-03: 10 mg via ORAL
  Filled 2020-08-03: qty 2

## 2020-08-03 MED ORDER — SODIUM CHLORIDE 0.9 % IV SOLN
2.0000 g | INTRAVENOUS | Status: DC
  Administered 2020-08-03 – 2020-08-04 (×2): 2 g via INTRAVENOUS
  Filled 2020-08-03 (×2): qty 20

## 2020-08-03 NOTE — Progress Notes (Signed)
PROGRESS NOTE    Kristin [name]  WUG:[id number] DOB: [birth year] DOA: 08/02/2020 PCP: Olin Hauser, DO    Brief Narrative:  86 year old female with history of restless leg syndrome overactive bladder, hypertension, hyperlipidemia, hepatitis C, gout, COPD, CKD stage IIIb, chronic diastolic heart failure, breast cancer, paroxysmal A. fib, pacemaker presented to the ER with shortness of breath and cough.  According to the patient, she has some lung conditions and uses oxygen at home.  On chart review, it looks like patient has chronic interstitial lung disease and diastolic heart failure.  Patient is started getting more shortness of breath so came to the hospital last night.  Was also recently seen at her primary care physician's office for worsening leg edema for which she was asked to take extra doses of Lasix.  In the emergency room, temperature 100.5.  On 3 L oxygen.  WC count 18,000.  Chest x-ray with bilateral interstitial lung disease, basal infiltrate versus atelectasis.  Started on antibiotics.  EKG with QTC of 509.   Assessment & Plan:   Principal Problem:   Acute respiratory failure with hypoxia (HCC) Active Problems:   Acute on chronic diastolic CHF (congestive heart failure) ([place])   Community acquired pneumonia   Prolonged QT interval  Community-acquired pneumonia, suspect gram-positive and gram-negative pneumonia.  Acute on chronic hypoxemic respiratory failure with underlying history of chronic hypoxic respiratory failure due to interstitial lung disease: Agree with admission given severity of symptoms. Started on Rocephin and azithromycin.  She received 2 doses of azithromycin.   Her QTC is prolonged.  We will continue Rocephin but discontinue azithromycin. Chest physiotherapy, incentive spirometry, deep breathing exercises, sputum induction, mucolytic's and bronchodilators. Sputum cultures, blood cultures, Legionella and streptococcal antigen. Supplemental oxygen  to keep saturations more than 90%.  Chronic diastolic congestive heart failure: Clinically euvolemic.  Resume Lasix 40 mg daily.  She was given 1 dose of IV Lasix in the morning.  Hypokalemia: Replace aggressively.  Paroxysmal A. fib, sick sinus syndrome status post pacemaker: Functioning well.  Currently rate controlled.  She is not on any rate control medication.  She is therapeutic on Eliquis.   DVT prophylaxis: SCDs Start: 08/03/20 0046 apixaban (ELIQUIS) tablet 5 mg   Code Status: Full code Family Communication: Patient's son on the phone Disposition Plan: Status is: Observation  The patient will require care spanning > 2 midnights and should be moved to inpatient because: Inpatient level of care appropriate due to severity of illness  Dispo: The patient is from: Home              Anticipated d/c is to: Home              Anticipated d/c date is: 2 days              Patient currently is not medically stable to d/c.         Consultants:   None  Procedures:   None  Antimicrobials:   Rocephin, azithromycin 12/11----   Subjective: Patient seen and examined.  No overnight events.  Continues to have dry hacking cough.  Also complains of pain and burning bilateral legs.  Temperature maximum 100.  No sputum production.  Objective: Vitals:   08/02/20 2130 08/02/20 2300 08/03/20 0035 08/03/20 0558  BP: 139/67 119/67 136/75 140/65  Pulse: 63 (!) 120 61 (!) 57  Resp: 18 18 20 20   Temp:    (!) 97.1 F (36.2 C)  TempSrc:      SpO2:  97% 98% 100% 95%  Weight:   70.5 kg 70.5 kg  Height:   5\' 7"  (1.702 m)     Intake/Output Summary (Last 24 hours) at 08/03/2020 1108 Last data filed at 08/03/2020 0451 Gross per 24 hour  Intake 240 ml  Output 1000 ml  Net -760 ml   Filed Weights   08/02/20 1248 08/03/20 0035 08/03/20 0558  Weight: 72.6 kg 70.5 kg 70.5 kg    Examination:  General exam: Appears in mild distress with dry hacking cough.  Otherwise comfortable.   Chronically sick looking. Respiratory system: Patient has wheezing right more than left.  She has some coarse crackles at the bases. Cardiovascular system: S1 & S2 heard, RRR.  Pacemaker in place.  No JVD, murmurs, rubs, gallops or clicks. No pedal edema. Gastrointestinal system: Abdomen is nondistended, soft and nontender. No organomegaly or masses felt. Normal bowel sounds heard. Central nervous system: Alert and oriented. No focal neurological deficits. Extremities: Symmetric 5 x 5 power. Skin: No rashes, lesions or ulcers Psychiatry: Judgement and insight appear normal. Mood & affect appropriate.     Data Reviewed: I have personally reviewed following labs and imaging studies  CBC: Recent Labs  Lab 08/02/20 2014 08/03/20 0637  WBC 18.0* 15.2*  NEUTROABS 15.0* 12.5*  HGB 13.0 13.7  HCT 42.0 46.0  MCV 97.2 97.3  PLT 125* 326*   Basic Metabolic Panel: Recent Labs  Lab 08/02/20 1703 08/03/20 0637  NA 138 140  K 3.8 3.4*  CL 103 101  CO2 23 27  GLUCOSE 84 94  BUN 25* 22  CREATININE 1.22* 1.07*  CALCIUM 9.1 9.4  MG  --  2.0   GFR: Estimated Creatinine Clearance: 35.3 mL/min (A) (by C-G formula based on SCr of 1.07 mg/dL (H)). Liver Function Tests: Recent Labs  Lab 08/03/20 0637  AST 31  ALT 22  ALKPHOS 68  BILITOT 1.6*  PROT 7.4  ALBUMIN 3.7   No results for input(s): LIPASE, AMYLASE in the last 168 hours. No results for input(s): AMMONIA in the last 168 hours. Coagulation Profile: No results for input(s): INR, PROTIME in the last 168 hours. Cardiac Enzymes: No results for input(s): CKTOTAL, CKMB, CKMBINDEX, TROPONINI in the last 168 hours. BNP (last 3 results) No results for input(s): PROBNP in the last 8760 hours. HbA1C: No results for input(s): HGBA1C in the last 72 hours. CBG: No results for input(s): GLUCAP in the last 168 hours. Lipid Profile: No results for input(s): CHOL, HDL, LDLCALC, TRIG, CHOLHDL, LDLDIRECT in the last 72 hours. Thyroid  Function Tests: No results for input(s): TSH, T4TOTAL, FREET4, T3FREE, THYROIDAB in the last 72 hours. Anemia Panel: No results for input(s): VITAMINB12, FOLATE, FERRITIN, TIBC, IRON, RETICCTPCT in the last 72 hours. Sepsis Labs: Recent Labs  Lab 08/02/20 2014  LATICACIDVEN 1.8    Recent Results (from the past 240 hour(s))  Resp Panel by RT-PCR (Flu A&B, Covid) Nasopharyngeal Swab     Status: None   Collection Time: 08/02/20 10:54 PM   Specimen: Nasopharyngeal Swab; Nasopharyngeal(NP) swabs in vial transport medium  Result Value Ref Range Status   SARS Coronavirus 2 by RT PCR NEGATIVE NEGATIVE Final    Comment: (NOTE) SARS-CoV-2 target nucleic acids are NOT DETECTED.  The SARS-CoV-2 RNA is generally detectable in upper respiratory specimens during the acute phase of infection. The lowest concentration of SARS-CoV-2 viral copies this assay can detect is 138 copies/mL. A negative result does not preclude SARS-Cov-2 infection and should not be used as  the sole basis for treatment or other patient management decisions. A negative result may occur with  improper specimen collection/handling, submission of specimen other than nasopharyngeal swab, presence of viral mutation(s) within the areas targeted by this assay, and inadequate number of viral copies(<138 copies/mL). A negative result must be combined with clinical observations, patient history, and epidemiological information. The expected result is Negative.  Fact Sheet for Patients:  EntrepreneurPulse.com.au  Fact Sheet for Healthcare Providers:  IncredibleEmployment.be  This test is no t yet approved or cleared by the [place] FDA and  has been authorized for detection and/or diagnosis of SARS-CoV-2 by FDA under an Emergency Use Authorization (EUA). This EUA will remain  in effect (meaning this test can be used) for the duration of the COVID-19 declaration under Section 564(b)(1) of  the Act, 21 U.S.C.section 360bbb-3(b)(1), unless the authorization is terminated  or revoked sooner.       Influenza A by PCR NEGATIVE NEGATIVE Final   Influenza B by PCR NEGATIVE NEGATIVE Final    Comment: (NOTE) The Xpert Xpress SARS-CoV-2/FLU/RSV plus assay is intended as an aid in the diagnosis of influenza from Nasopharyngeal swab specimens and should not be used as a sole basis for treatment. Nasal washings and aspirates are unacceptable for Xpert Xpress SARS-CoV-2/FLU/RSV testing.  Fact Sheet for Patients: EntrepreneurPulse.com.au  Fact Sheet for Healthcare Providers: IncredibleEmployment.be  This test is not yet approved or cleared by the [place] FDA and has been authorized for detection and/or diagnosis of SARS-CoV-2 by FDA under an Emergency Use Authorization (EUA). This EUA will remain in effect (meaning this test can be used) for the duration of the COVID-19 declaration under Section 564(b)(1) of the Act, 21 U.S.C. section 360bbb-3(b)(1), unless the authorization is terminated or revoked.  Performed at [facility], [address], [place], [place] [address]          Radiology Studies: DG CHEST PORT 1 VIEW  Result Date: 08/03/2020 CLINICAL DATA:  Hypoxia EXAM: PORTABLE CHEST 1 VIEW COMPARISON:  08/02/2020 FINDINGS: Normal cardiac silhouette. Diffuse interstitial opacities unchanged. No clear focal consolidation. No pleural fluid. Density over RIGHT lung bases a similar to improved. No pneumothorax. IMPRESSION: 1. Chronic interstitial opacities. 2. Cannot exclude superimposed edema. 3. RIGHT basilar opacity is slightly improved suggesting improving atelectasis. Electronically Signed   By: Suzy Bouchard M.D.   On: 08/03/2020 05:49   DG Chest Portable 1 View  Result Date: 08/02/2020 CLINICAL DATA:  Shortness of breath, fever. EXAM: PORTABLE CHEST 1 VIEW COMPARISON:  July 16, 2020. FINDINGS: Stable cardiomediastinal  silhouette. Left-sided pacemaker is unchanged in position. No pneumothorax is noted. Status post bilateral shoulder arthroplasties. Stable interstitial opacities are noted throughout both lungs which may represent edema and some degree of chronic interstitial lung disease. Increased right basilar atelectasis or infiltrate is noted with probable small right pleural effusion. IMPRESSION: Increased right basilar atelectasis or infiltrate is noted with probable small right pleural effusion. Stable interstitial opacities are noted throughout both lungs which may represent edema and some degree of chronic interstitial lung disease. Electronically Signed   By: Marijo Conception M.D.   On: 08/02/2020 13:30        Scheduled Meds: . apixaban  5 mg Oral BID  . dextromethorphan-guaiFENesin  1 tablet Oral BID  . furosemide  40 mg Oral BID  . gabapentin  400 mg Oral QHS  . melatonin  9 mg Oral QHS  . potassium chloride  20 mEq Oral BID  . rOPINIRole  4  mg Oral QHS   Continuous Infusions: . cefTRIAXone (ROCEPHIN)  IV       LOS: 0 days    Time spent: 35 minutes    Barb Merino, MD [facility] Pager [phone]

## 2020-08-03 NOTE — Plan of Care (Signed)

## 2020-08-03 NOTE — ED Notes (Signed)
PT report called to [facility] [place], verbalized complete understanding of Pt plan of care and current condition, denies questions at this time. PT resting on stretcher rails up x 2 call light in hand. Pt remains a/o x 4 skin warm dry intact. Pt VSS NAD PT denies needs or pain at this time. Pt is currently hemodynamically stable and awaiting transport to clean ready room.  PT IV healthy and intact.

## 2020-08-04 NOTE — Plan of Care (Signed)
  Problem: Acute Rehab PT Goals(only PT should resolve) Goal: Pt Will Go Supine/Side To Sit Outcome: Progressing Flowsheets (Taken 08/04/2020 1414) Pt will go Supine/Side to Sit:  Independently  with modified independence Goal: Patient Will Transfer Sit To/From Stand Outcome: Progressing Flowsheets (Taken 08/04/2020 1414) Patient will transfer sit to/from stand: with modified independence Goal: Pt Will Transfer Bed To Chair/Chair To Bed Outcome: Progressing Flowsheets (Taken 08/04/2020 1414) Pt will Transfer Bed to Chair/Chair to Bed: with modified independence Goal: Pt Will Ambulate Outcome: Progressing Flowsheets (Taken 08/04/2020 1414) Pt will Ambulate:  100 feet  with modified independence  with supervision  with cane   2:15 PM, 08/04/20 Lonell Grandchild, MPT Physical Therapist with [facility] [phone] office [phone] mobile phone

## 2020-08-04 NOTE — Progress Notes (Signed)
OT Cancellation Note  Patient Details Name: Kristin [name] MRN: [id number] DOB: 13-Oct-1929   Cancelled Treatment:    Reason Eval/Treat Not Completed: OT screened, no needs identified, will sign off. Patient is functioning at baseline of modified independence to supervision level. She lives with her children who will be able to provide assistance as needed once she returns home. No follow up OT services are needed at this time. Thank you for the referral.    Ailene Ravel, OTR/L,CBIS  [phone]  08/04/2020, 4:06 PM

## 2020-08-04 NOTE — Evaluation (Signed)
Physical Therapy Evaluation Patient Details Name: Kristin [name] MRN: [id number] DOB: [birth year] Today's Date: 08/04/2020   History of Present Illness  Gilberto Streck  is a 86 y.o. female, with history of restless leg, overactive bladder, hypertension, hyperlipidemia, hepatitis C, gout, COPD, CKD, CHF, breast cancer, atrial fibrillation, and more presents to the ER with a chief complaint of dyspnea.  Patient reports it was acute onset yesterday, and has been progressively worse since.  She wears 2 L nasal cannula at baseline for COPD, and has not attempted to increase her oxygen at home.  She reports her shortness of breath is worse with exertion, better with rest. She reports a cough productive of clear sputum.  She has had subjective fevers at home.  She reports a mild chest pain on the right side of her chest, she cannot characterize it.  She reports nothing makes it worse and nothing makes it better.  It is not worse with deep inspiration specifically.  She took Tylenol and it was improved.  Patient states that she is also had leg pain that has been going on for 2 weeks.  Standing on her legs makes the pain worse.  Rest makes it better.  She is taking Tylenol but it has not helped.  She has associated bilateral lower extremity edema per her report-not seen on exam.  She takes 40 mg of Lasix at home every day, reports that it has not helped with the edema.  Patient has no other complaints at this time.    Clinical Impression  Patient functioning near baseline for functional mobility and gait, slightly unsteady on feet, no loss of balance during ambulation while on 2 LPM with SpO2 at 95% and demonstrates good return for transferring to commode in bathroom.  Patient tolerated sitting up in chair after therapy - RN notified.  Patient will benefit from continued physical therapy in hospital and recommended venue below to increase strength, balance, endurance for safe ADLs and gait.      Follow Up  Recommendations Home health PT;Supervision for mobility/OOB;Supervision - Intermittent    Equipment Recommendations  None recommended by PT    Recommendations for Other Services       Precautions / Restrictions Precautions Precautions: Fall Restrictions Weight Bearing Restrictions: No      Mobility  Bed Mobility Overal bed mobility: Modified Independent                  Transfers Overall transfer level: Needs assistance Equipment used: Straight cane Transfers: Sit to/from Stand;Stand Pivot Transfers Sit to Stand: Supervision Stand pivot transfers: Supervision       General transfer comment: slightly labored movement, good return for transferring to commode in bathroom  Ambulation/Gait Ambulation/Gait assistance: Supervision Gait Distance (Feet): 55 Feet Assistive device: Straight cane Gait Pattern/deviations: Step-to pattern;Decreased step length - left;Decreased stance time - right;Decreased stride length Gait velocity: decreased   General Gait Details: slightly labored cadence with mostly 3 point gait pattern using SPC, no loss of balance, limited secondary to fatigue, on 2 LPM with SpO2 at 95%  Stairs            Wheelchair Mobility    Modified Rankin (Stroke Patients Only)       Balance Overall balance assessment: Needs assistance Sitting-balance support: Feet unsupported;No upper extremity supported Sitting balance-Leahy Scale: Good Sitting balance - Comments: seated at EOB   Standing balance support: During functional activity;No upper extremity supported Standing balance-Leahy Scale: Poor Standing balance comment: fair/poor without AD, fair  usin SPC                             Pertinent Vitals/Pain Pain Assessment: 0-10 Pain Score: 5  Pain Location: chronic low back pain Pain Descriptors / Indicators: Sore Pain Intervention(s): Limited activity within patient's tolerance;Monitored during session    Home Living  Family/patient expects to be discharged to:: Private residence Living Arrangements: Children Available Help at Discharge: Family;Available PRN/intermittently Type of Home: House Home Access: Level entry     Home Layout: Laundry or work area in basement;Able to live on main level with bedroom/bathroom;Other (Comment) (Patient states she does not go to basement) Home Equipment: Bedside commode;Walker - 4 wheels      Prior Function Level of Independence: Independent with assistive device(s)         Comments: Hydrographic surveyor using SPC, drives     Hand Dominance   Dominant Hand: Right    Extremity/Trunk Assessment   Upper Extremity Assessment Upper Extremity Assessment: Defer to OT evaluation    Lower Extremity Assessment Lower Extremity Assessment: Generalized weakness    Cervical / Trunk Assessment Cervical / Trunk Assessment: Normal  Communication   Communication: No difficulties  Cognition Arousal/Alertness: Awake/alert Behavior During Therapy: WFL for tasks assessed/performed Overall Cognitive Status: Within Functional Limits for tasks assessed                                        General Comments      Exercises     Assessment/Plan    PT Assessment Patient needs continued PT services  PT Problem List Decreased strength;Decreased activity tolerance;Decreased balance;Decreased mobility       PT Treatment Interventions Balance training;Gait training;Stair training;Functional mobility training;Therapeutic activities;Therapeutic exercise;DME instruction    PT Goals (Current goals can be found in the Care Plan section)  Acute Rehab PT Goals Patient Stated Goal: return home with family to assist PT Goal Formulation: With patient Time For Goal Achievement: 08/08/20 Potential to Achieve Goals: Good    Frequency Min 3X/week   Barriers to discharge        Co-evaluation               AM-PAC PT "6 Clicks" Mobility  Outcome  Measure Help needed turning from your back to your side while in a flat bed without using bedrails?: None Help needed moving from lying on your back to sitting on the side of a flat bed without using bedrails?: None Help needed moving to and from a bed to a chair (including a wheelchair)?: A Little Help needed standing up from a chair using your arms (e.g., wheelchair or bedside chair)?: A Little Help needed to walk in hospital room?: A Little Help needed climbing 3-5 steps with a railing? : A Lot 6 Click Score: 19    End of Session Equipment Utilized During Treatment: Oxygen Activity Tolerance: Patient tolerated treatment well;Patient limited by fatigue Patient left: in chair;with call bell/phone within reach Nurse Communication: Mobility status PT Visit Diagnosis: Unsteadiness on feet (R26.81);Other abnormalities of gait and mobility (R26.89);Muscle weakness (generalized) (M62.81)    Time: 4765-4650 PT Time Calculation (min) (ACUTE ONLY): 30 min   Charges:   PT Evaluation $PT Eval Moderate Complexity: 1 Mod PT Treatments $Therapeutic Activity: 23-37 mins        2:10 PM, 08/04/20 Lonell Grandchild, MPT Physical Therapist with [facility]  [facility] [phone] office [phone] mobile phone

## 2020-08-04 NOTE — Plan of Care (Signed)
°  Problem: SLP Dysphagia Goals Goal: Patient will utilize recommended strategies Description: Patient will utilize recommended strategies during swallow to increase swallowing safety with Flowsheets (Taken 08/04/2020 1319) Patient will utilize recommended strategies during swallow to increase swallowing safety with: modified independence   Thank you,  Genene Churn, [place]

## 2020-08-04 NOTE — Progress Notes (Signed)
PROGRESS NOTE    Kristin [name]  BSJ:[id number] DOB: 06-13-1930 DOA: 08/02/2020 PCP: Olin Hauser, DO    Brief Narrative:  86 year old female with history of restless leg syndrome overactive bladder, hypertension, hyperlipidemia, hepatitis C, gout, COPD, CKD stage IIIb, chronic diastolic heart failure, breast cancer, paroxysmal A. fib, pacemaker presented to the ER with shortness of breath and cough.  According to the patient, she has some lung conditions and uses oxygen at home.  On chart review, it looks like patient has chronic interstitial lung disease and diastolic heart failure.  Patient is started getting more shortness of breath so came to the hospital last night.  Was also recently seen at her primary care physician's office for worsening leg edema for which she was asked to take extra doses of Lasix.  In the emergency room, temperature 100.5.  On 3 L oxygen.  WC count 18,000.  Chest x-ray with bilateral interstitial lung disease, basal infiltrate versus atelectasis.  Started on antibiotics.  EKG with QTC of 509.   Assessment & Plan:   Principal Problem:   Acute respiratory failure with hypoxia (HCC) Active Problems:   Acute on chronic diastolic CHF (congestive heart failure) ([place])   Community acquired pneumonia   Prolonged QT interval   Pneumonia  Community-acquired pneumonia, suspect gram-positive and gram-negative pneumonia.  Acute on chronic hypoxemic respiratory failure with underlying history of chronic hypoxic respiratory failure due to interstitial lung disease: Started on Rocephin and azithromycin.  She received 2 doses of azithromycin.   Her QTC is prolonged.  We will continue Rocephin but discontinue azithromycin. Chest physiotherapy, incentive spirometry, deep breathing exercises, sputum induction, mucolytic's and bronchodilators. Sputum cultures, blood cultures, Legionella and streptococcal antigen. Supplemental oxygen to keep saturations more than  90%. Mobilize, may go home with additional home oxygen.  Chronic diastolic congestive heart failure: Clinically euvolemic.  Resume Lasix 40 mg daily.  She was given 1 dose of IV Lasix in the morning.  Hypokalemia: Replace aggressively. Recheck levels in the morning.  Paroxysmal A. fib, sick sinus syndrome status post pacemaker: Functioning well.  Currently rate controlled.  She is not on any rate control medication.  She is therapeutic on Eliquis.   DVT prophylaxis: SCDs Start: 08/03/20 0046 apixaban (ELIQUIS) tablet 5 mg   Code Status: Full code Family Communication: Grand daughter [name] on the phone. Disposition Plan: Status is: inpatient .  The patient will require care spanning > 2 midnights and should be moved to inpatient because: Inpatient level of care appropriate due to severity of illness  Dispo: The patient is from: Home              Anticipated d/c is to: Home              Anticipated d/c date is: tomorrow               Patient currently is not medically stable to d/c.  Consultants:   None  Procedures:   None  Antimicrobials:   Rocephin 12/11-  azithromycin 12/11---12/13   Subjective: Patient seen and examined.  No overnight events.  She has not mobilized yet.  Wants to go home.  Still has some dry hacking cough.  Afebrile overnight.  Objective: Vitals:   08/03/20 0035 08/03/20 0558 08/03/20 1402 08/04/20 0500  BP: 136/75 140/65 114/63 120/68  Pulse: 61 (!) 57 62 60  Resp: 20 20 18 18   Temp:  (!) 97.1 F (36.2 C) (!) 97.5 F (36.4 C) 98.8 F (37.1  C)  TempSrc:   Oral Oral  SpO2: 100% 95% 95% 99%  Weight: 70.5 kg 70.5 kg  68.8 kg  Height: 5\' 7"  (1.702 m)       Intake/Output Summary (Last 24 hours) at 08/04/2020 1156 Last data filed at 08/04/2020 0657 Gross per 24 hour  Intake 460 ml  Output --  Net 460 ml   Filed Weights   08/03/20 0035 08/03/20 0558 08/04/20 0500  Weight: 70.5 kg 70.5 kg 68.8 kg    Examination:  General exam: Looks  fairly comfortable.  She is on 3 L of oxygen.   Respiratory system: Mostly clear.  Occasional expiratory crackles at bases.   Cardiovascular system: S1 & S2 heard, RRR.  Pacemaker in place.  No JVD, murmurs, rubs, gallops or clicks. No pedal edema. Gastrointestinal system: Abdomen is nondistended, soft and nontender. No organomegaly or masses felt. Normal bowel sounds heard. Central nervous system: Alert and oriented. No focal neurological deficits. Extremities: Symmetric 5 x 5 power. Skin: No rashes, lesions or ulcers Psychiatry: Judgement and insight appear normal. Mood & affect appropriate.     Data Reviewed: I have personally reviewed following labs and imaging studies  CBC: Recent Labs  Lab 08/02/20 2014 08/03/20 0637  WBC 18.0* 15.2*  NEUTROABS 15.0* 12.5*  HGB 13.0 13.7  HCT 42.0 46.0  MCV 97.2 97.3  PLT 125* 045*   Basic Metabolic Panel: Recent Labs  Lab 08/02/20 1703 08/03/20 0637  NA 138 140  K 3.8 3.4*  CL 103 101  CO2 23 27  GLUCOSE 84 94  BUN 25* 22  CREATININE 1.22* 1.07*  CALCIUM 9.1 9.4  MG  --  2.0   GFR: Estimated Creatinine Clearance: 35.3 mL/min (A) (by C-G formula based on SCr of 1.07 mg/dL (H)). Liver Function Tests: Recent Labs  Lab 08/03/20 0637  AST 31  ALT 22  ALKPHOS 68  BILITOT 1.6*  PROT 7.4  ALBUMIN 3.7   No results for input(s): LIPASE, AMYLASE in the last 168 hours. No results for input(s): AMMONIA in the last 168 hours. Coagulation Profile: No results for input(s): INR, PROTIME in the last 168 hours. Cardiac Enzymes: No results for input(s): CKTOTAL, CKMB, CKMBINDEX, TROPONINI in the last 168 hours. BNP (last 3 results) No results for input(s): PROBNP in the last 8760 hours. HbA1C: No results for input(s): HGBA1C in the last 72 hours. CBG: No results for input(s): GLUCAP in the last 168 hours. Lipid Profile: No results for input(s): CHOL, HDL, LDLCALC, TRIG, CHOLHDL, LDLDIRECT in the last 72 hours. Thyroid Function  Tests: No results for input(s): TSH, T4TOTAL, FREET4, T3FREE, THYROIDAB in the last 72 hours. Anemia Panel: No results for input(s): VITAMINB12, FOLATE, FERRITIN, TIBC, IRON, RETICCTPCT in the last 72 hours. Sepsis Labs: Recent Labs  Lab 08/02/20 2014  LATICACIDVEN 1.8    Recent Results (from the past 240 hour(s))  Resp Panel by RT-PCR (Flu A&B, Covid) Nasopharyngeal Swab     Status: None   Collection Time: 08/02/20 10:54 PM   Specimen: Nasopharyngeal Swab; Nasopharyngeal(NP) swabs in vial transport medium  Result Value Ref Range Status   SARS Coronavirus 2 by RT PCR NEGATIVE NEGATIVE Final    Comment: (NOTE) SARS-CoV-2 target nucleic acids are NOT DETECTED.  The SARS-CoV-2 RNA is generally detectable in upper respiratory specimens during the acute phase of infection. The lowest concentration of SARS-CoV-2 viral copies this assay can detect is 138 copies/mL. A negative result does not preclude SARS-Cov-2 infection and should not be used  as the sole basis for treatment or other patient management decisions. A negative result may occur with  improper specimen collection/handling, submission of specimen other than nasopharyngeal swab, presence of viral mutation(s) within the areas targeted by this assay, and inadequate number of viral copies(<138 copies/mL). A negative result must be combined with clinical observations, patient history, and epidemiological information. The expected result is Negative.  Fact Sheet for Patients:  EntrepreneurPulse.com.au  Fact Sheet for Healthcare Providers:  IncredibleEmployment.be  This test is no t yet approved or cleared by the [place] FDA and  has been authorized for detection and/or diagnosis of SARS-CoV-2 by FDA under an Emergency Use Authorization (EUA). This EUA will remain  in effect (meaning this test can be used) for the duration of the COVID-19 declaration under Section 564(b)(1) of the Act,  21 U.S.C.section 360bbb-3(b)(1), unless the authorization is terminated  or revoked sooner.       Influenza A by PCR NEGATIVE NEGATIVE Final   Influenza B by PCR NEGATIVE NEGATIVE Final    Comment: (NOTE) The Xpert Xpress SARS-CoV-2/FLU/RSV plus assay is intended as an aid in the diagnosis of influenza from Nasopharyngeal swab specimens and should not be used as a sole basis for treatment. Nasal washings and aspirates are unacceptable for Xpert Xpress SARS-CoV-2/FLU/RSV testing.  Fact Sheet for Patients: EntrepreneurPulse.com.au  Fact Sheet for Healthcare Providers: IncredibleEmployment.be  This test is not yet approved or cleared by the [place] FDA and has been authorized for detection and/or diagnosis of SARS-CoV-2 by FDA under an Emergency Use Authorization (EUA). This EUA will remain in effect (meaning this test can be used) for the duration of the COVID-19 declaration under Section 564(b)(1) of the Act, 21 U.S.C. section 360bbb-3(b)(1), unless the authorization is terminated or revoked.  Performed at [facility], [address] [place], [place] [address]          Radiology Studies: DG CHEST PORT 1 VIEW  Result Date: 08/03/2020 CLINICAL DATA:  Hypoxia EXAM: PORTABLE CHEST 1 VIEW COMPARISON:  08/02/2020 FINDINGS: Normal cardiac silhouette. Diffuse interstitial opacities unchanged. No clear focal consolidation. No pleural fluid. Density over RIGHT lung bases a similar to improved. No pneumothorax. IMPRESSION: 1. Chronic interstitial opacities. 2. Cannot exclude superimposed edema. 3. RIGHT basilar opacity is slightly improved suggesting improving atelectasis. Electronically Signed   By: Suzy Bouchard M.D.   On: 08/03/2020 05:49   DG Chest Portable 1 View  Result Date: 08/02/2020 CLINICAL DATA:  Shortness of breath, fever. EXAM: PORTABLE CHEST 1 VIEW COMPARISON:  July 16, 2020. FINDINGS: Stable cardiomediastinal  silhouette. Left-sided pacemaker is unchanged in position. No pneumothorax is noted. Status post bilateral shoulder arthroplasties. Stable interstitial opacities are noted throughout both lungs which may represent edema and some degree of chronic interstitial lung disease. Increased right basilar atelectasis or infiltrate is noted with probable small right pleural effusion. IMPRESSION: Increased right basilar atelectasis or infiltrate is noted with probable small right pleural effusion. Stable interstitial opacities are noted throughout both lungs which may represent edema and some degree of chronic interstitial lung disease. Electronically Signed   By: Marijo Conception M.D.   On: 08/02/2020 13:30        Scheduled Meds: . apixaban  5 mg Oral BID  . dextromethorphan-guaiFENesin  1 tablet Oral BID  . furosemide  40 mg Oral BID  . gabapentin  400 mg Oral QHS  . melatonin  9 mg Oral QHS  . potassium chloride  20 mEq Oral BID  . rOPINIRole  4 mg Oral QHS   Continuous Infusions: . cefTRIAXone (ROCEPHIN)  IV Stopped (08/03/20 2145)     LOS: 1 day    Time spent: 30 minutes    Barb Merino, MD [facility] Pager [phone]

## 2020-08-04 NOTE — Evaluation (Signed)
Clinical/Bedside Swallow Evaluation Patient Details  Name: TAJ ARTEAGA MRN: [id number] Date of Birth: [birth year]  Today's Date: 08/04/2020 Time: SLP Start Time (ACUTE ONLY): 5784 SLP Stop Time (ACUTE ONLY): 0943 SLP Time Calculation (min) (ACUTE ONLY): 19.73 min  Past Medical History:  Past Medical History:  Diagnosis Date  . Acute postoperative pain 02/09/2018  . Arrhythmia, sinus node 04/03/2014  . Arthritis   . Arthritis   . Atrial fibrillation ([place])   . Back pain    lower back chronic  . Bradycardia   . Breast cancer ([place]) 2004   right breast cancer  . Cancer [facility]) 2004   rt breast cancer-post lumpectomy- chemo/rad  . CHF (congestive heart failure) ([place])   . Chronic back pain   . CKD (chronic kidney disease)   . COPD (chronic obstructive pulmonary disease) (HCC)    wears O2 at 2L via [place] at night  . Cystocele   . Decubitus ulcers   . Dehydration   . Gout   . Hematuria   . Hepatitis C   . Hyperlipidemia   . Hypertension   . Hypomagnesemia 06/25/2015  . OAB (overactive bladder)   . Overactive bladder   . Restless leg   . Shoulder pain, bilateral   . Urinary frequency   . UTI (lower urinary tract infection)   . Vaginal atrophy    Past Surgical History:  Past Surgical History:  Procedure Laterality Date  . ABDOMINAL HYSTERECTOMY    . BACK SURGERY    . BREAST LUMPECTOMY Right 2004  . BREAST SURGERY     rt lumpectomy  . CARPAL TUNNEL RELEASE    . CATARACT EXTRACTION W/PHACO  10/19/2011   Procedure: CATARACT EXTRACTION PHACO AND INTRAOCULAR LENS PLACEMENT (IOC);  Surgeon: Elta Guadeloupe T. Gershon Crane, MD;  Location: [facility];  Service: Ophthalmology;  Laterality: Right;  CDE:10.81  . CATARACT EXTRACTION W/PHACO  11/02/2011   Procedure: CATARACT EXTRACTION PHACO AND INTRAOCULAR LENS PLACEMENT (IOC);  Surgeon: Elta Guadeloupe T. Gershon Crane, MD;  Location: [facility];  Service: Ophthalmology;  Laterality: Left;  CDE 8.60  . CHOLECYSTECTOMY     3/18  . COLON SURGERY    . INSERT / REPLACE / REMOVE  PACEMAKER    . JOINT REPLACEMENT     bilateral TKA  . PACEMAKER INSERTION N/A 12/24/2015   Procedure: INSERTION PACEMAKER;  Surgeon: Isaias Cowman, MD;  Location: [facility] ORS;  Service: Cardiovascular;  Laterality: N/A;  . ROTATOR CUFF REPAIR     bilateral   HPI:  86 year old female with history of restless leg syndrome overactive bladder, hypertension, hyperlipidemia, hepatitis C, gout, COPD, CKD stage IIIb, chronic diastolic heart failure, breast cancer, paroxysmal A. fib, pacemaker presented to the ER with shortness of breath and cough.  According to the patient, she has some lung conditions and uses oxygen at home.  On chart review, it looks like patient has chronic interstitial lung disease and diastolic heart failure.  Patient is started getting more shortness of breath so came to the hospital last night.  Was also recently seen at her primary care physician's office for worsening leg edema for which she was asked to take extra doses of Lasix. In the emergency room, temperature 100.5.  On 3 L oxygen.  WC count 18,000.  Chest x-ray with bilateral interstitial lung disease, basal infiltrate versus atelectasis.  Started on antibiotics. BSE requested.   Assessment / Plan / Recommendation Clinical Impression  Clinical swallow evaluation completed while Pt sitting up in chair in room. She denies difficulty  swallowing. Pt wears U/L dentures, however she only has her upper dentures present today. Oral motor examination is WFL with the exception of mild hoarse vocal quality which Pt indicates has improved from yesterday. Pt without overt signs or symptoms of aspiration nor reports of globus. Pt with mild oral phase dysphagia with impaired mastication resulting in prolonged oral transit. Pt reportedly has U/L dentures, but only has her upper dentures here. Pt was able to consume regular textures when SLP cut up into bite sizes pieces. Recommend D3/mech soft and thin liquids with standard aspiration and  reflux precautions. SLP will follow x1 for diet tolerance.   SLP Visit Diagnosis: Dysphagia, unspecified (R13.10)    Aspiration Risk  Mild aspiration risk    Diet Recommendation Dysphagia 3 (Mech soft);Thin liquid   Liquid Administration via: Cup;Straw Medication Administration: Whole meds with liquid Supervision: Patient able to self feed Compensations: Small sips/bites Postural Changes: Seated upright at 90 degrees;Remain upright for at least 30 minutes after po intake    Other  Recommendations Oral Care Recommendations: Oral care BID;Staff/trained caregiver to provide oral care Other Recommendations: Clarify dietary restrictions   Follow up Recommendations None      Frequency and Duration min 2x/week  1 week       Prognosis Prognosis for Safe Diet Advancement: Good      Swallow Study   General Date of Onset: 08/02/20 HPI: 86 year old female with history of restless leg syndrome overactive bladder, hypertension, hyperlipidemia, hepatitis C, gout, COPD, CKD stage IIIb, chronic diastolic heart failure, breast cancer, paroxysmal A. fib, pacemaker presented to the ER with shortness of breath and cough.  According to the patient, she has some lung conditions and uses oxygen at home.  On chart review, it looks like patient has chronic interstitial lung disease and diastolic heart failure.  Patient is started getting more shortness of breath so came to the hospital last night.  Was also recently seen at her primary care physician's office for worsening leg edema for which she was asked to take extra doses of Lasix. In the emergency room, temperature 100.5.  On 3 L oxygen.  WC count 18,000.  Chest x-ray with bilateral interstitial lung disease, basal infiltrate versus atelectasis.  Started on antibiotics. BSE requested. Type of Study: Bedside Swallow Evaluation Previous Swallow Assessment: N/A Diet Prior to this Study: Regular;Thin liquids Temperature Spikes Noted: No Respiratory  Status: Nasal cannula (on 2L at home) History of Recent Intubation: No Behavior/Cognition: Alert;Cooperative;Pleasant mood Oral Cavity Assessment: Within Functional Limits Oral Care Completed by SLP: Recent completion by staff Oral Cavity - Dentition: Dentures, top (her lower dentures are at home) Vision: Functional for self-feeding Self-Feeding Abilities: Able to feed self Patient Positioning: Upright in chair Baseline Vocal Quality: Hoarse Volitional Cough: Strong Volitional Swallow: Able to elicit    Oral/Motor/Sensory Function Overall Oral Motor/Sensory Function: Within functional limits   Ice Chips Ice chips: Within functional limits Presentation: Spoon   Thin Liquid Thin Liquid: Within functional limits Presentation: Cup;Self Fed;Straw    Nectar Thick Nectar Thick Liquid: Not tested   Honey Thick Honey Thick Liquid: Not tested   Puree Puree: Within functional limits Presentation: Spoon   Solid     Solid: Impaired Presentation: Spoon Oral Phase Impairments: Impaired mastication Oral Phase Functional Implications: Prolonged oral transit     Thank you,  Genene Churn, [place]  Rosalina Dingwall 08/04/2020,9:44 AM

## 2020-08-04 NOTE — TOC Initial Note (Signed)
Transition of Care [facility]) - Initial/Assessment Note   Patient Details  Name: Kristin [name] MRN: [id number] Date of Birth: [birth year]  [facility]) CM/SW Contact:    Sherie Don, LCSW Phone Number: 08/04/2020, 1:33 PM  Clinical Narrative: Patient is an 86 year old female who was admitted for acute respiratory failure with hypoxia. PT evaluation recommended HHPT. CSW spoke with patient and she is agreeable to HHPT as she is active with [place]. CSW confirmed with Georgina Snell with [facility] patient is active for PT/OT. TOC to follow.  Expected Discharge Plan: [place] Barriers to Discharge: Continued Medical Work up  Patient Goals and CMS Choice Patient states their goals for this hospitalization and ongoing recovery are:: Discharge home with [facility] CMS Medicare.gov Compare Post Acute Care list provided to:: Patient Choice offered to / list presented to : Patient  Expected Discharge Plan and Services Expected Discharge Plan: [place] In-house Referral: Clinical Social Work Discharge Planning Services: NA Post Acute Care Choice: [place] arrangements for the past 2 months: Single Family Home            DME Arranged: N/A DME Agency: NA HH Arranged: PT,OT [place] Agency: [place] Date [place]: 08/04/20 Time [place]: 63 Representative spoke with at [place]: Georgina Snell  Prior Living Arrangements/Services Living arrangements for the past 2 months: [place] Patient language and need for interpreter reviewed:: Yes Do you feel safe going back to the place where you live?: Yes      Need for Family Participation in Patient Care: No (Comment) Care giver support system in place?: Yes (comment) Current home services: DME (Patient has home O2) Criminal Activity/Legal Involvement Pertinent to Current Situation/Hospitalization: No - Comment as needed  Activities of Daily Living Home Assistive  Devices/Equipment: Cane (specify quad or straight) ADL Screening (condition at time of admission) Patient's cognitive ability adequate to safely complete daily activities?: Yes Is the patient deaf or have difficulty hearing?: Yes Does the patient have difficulty seeing, even when wearing glasses/contacts?: Yes Does the patient have difficulty concentrating, remembering, or making decisions?: No Patient able to express need for assistance with ADLs?: No Does the patient have difficulty dressing or bathing?: No Independently performs ADLs?: Yes (appropriate for developmental age) Does the patient have difficulty walking or climbing stairs?: Yes Weakness of Legs: Both Weakness of Arms/Hands: Both  Permission Sought/Granted Permission sought to share information with : Other (comment) Permission granted to share information with : Yes, Verbal Permission Granted Permission granted to share info w AGENCY: [facility]  Emotional Assessment Appearance:: Appears stated age Attitude/Demeanor/Rapport: Engaged Affect (typically observed): Accepting Orientation: : Oriented to Self,Oriented to Place,Oriented to  Time,Oriented to Situation Alcohol / Substance Use: Not Applicable Psych Involvement: No (comment)  Admission diagnosis:  Acute respiratory failure with hypoxia ([place]) [J96.01] Community acquired pneumonia of right lower lobe of lung [J18.9] Pneumonia [J18.9] Patient Active Problem List   Diagnosis Date Noted  . Pneumonia 08/03/2020  . Acute respiratory failure with hypoxia ([place]) 08/02/2020  . Community acquired pneumonia 08/02/2020  . Prolonged QT interval 08/02/2020  . Venous insufficiency of both lower extremities 07/04/2020  . COVID-19 06/11/2020  . Respiratory failure with hypoxia ([place]) 05/23/2020  . Varicose veins of left lower extremity with inflammation, with ulcer of ankle limited to breakdown of skin ([place]) 04/01/2020  . Uncomplicated opioid dependence ([place]) 02/14/2020  .  Abnormal MRI, lumbar spine (11/03/2019) 01/23/2020  . Pharmacologic  therapy 12/20/2019  . Other specified dorsopathies, sacral and sacrococcygeal region 06/12/2019  . Chronic hip pain (Left) 06/12/2019  . Osteoarthritis of hip (Left) 06/12/2019  . Greater trochanteric bursitis of hip (Left) 03/12/2019  . Preop testing 01/22/2019  . Cervicalgia 10/03/2018  . Chronic shoulders pain (Bilateral) (L>R) 05/29/2018  . Osteoarthritis of shoulder (Bilateral) 05/29/2018  . Osteoarthritis of hip (Bilateral) 05/29/2018  . Chronic shoulder pain after shoulder replacement (Bilateral) (L>R) 05/29/2018  . Trigger finger, left middle finger 05/29/2018  . Spondylosis without myelopathy or radiculopathy, lumbosacral region 02/09/2018  . Insomnia 02/01/2018  . Numbness and tingling of upper extremity (Bilateral) 09/07/2017  . Chronic shoulder pain, Radicular (Bilateral) (L>R) 09/07/2017  . Cervical facet syndrome (Bilateral) (L>R) 09/07/2017  . Cervical (3-4 mm) Anterolisthesis of C4 on C5 09/07/2017  . Cervical foraminal stenosis (Bilateral) 09/07/2017  . Chronic lower extremity pain (Secondary Area of Pain) (Bilateral) (L>R) 05/13/2017  . DDD (degenerative disc disease), cervical 05/13/2017  . Chronic cervical radicular pain [facility] Area of Pain) (Bilateral) (L>R) 05/04/2017  . Chronic anticoagulation (Eliquis) 05/04/2017  . Chronic hip pain (Bilateral) 04/20/2017  . Chronic neck pain 03/01/2017  . Hyperlipidemia 02/02/2017  . Red blood cell antibody positive 11/18/2016  . Anticoagulated 11/17/2016  . Atrial fibrillation and flutter ([place]) 11/17/2016  . Osteoarthritis involving multiple joints 06/14/2016  . Cardiac pacemaker in situ 01/07/2016  . S/P cardiac pacemaker procedure 01/07/2016  . GERD (gastroesophageal reflux disease) 12/18/2015  . Chronic pain syndrome 06/25/2015  . Chronic low back pain (Primary Area of Pain) (Bilateral) (L>R) 06/25/2015  . Lumbar facet syndrome (Bilateral) (L>R)  06/25/2015  . Lumbar spondylosis 06/25/2015  . Chronic lumbar radicular pain (Bilateral) (L>R) 06/25/2015  . Restless leg syndrome 06/25/2015  . Myofascial pain 06/25/2015  . Neurogenic pain 06/25/2015  . Long term current use of opiate analgesic 06/25/2015  . Long term prescription opiate use 06/25/2015  . Opiate use (90 MME/Day) 06/25/2015  . Opioid-induced constipation (OIC) 06/25/2015  . Vitamin D insufficiency 06/25/2015  . Grade 1 Retrolisthesis of L1 over L2 06/25/2015  . Lumbar facet hypertrophy (L1-2, L2-3, and L4-5) 06/25/2015  . Lumbar lateral recess stenosis (L1-2 and L4-5) 06/25/2015  . 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 Epidural fibrosis 06/25/2015  . Lumbar foraminal stenosis (Severe Left L4-5) 06/25/2015  . Osteoporosis 06/25/2015  . Chronic sacroiliac joint pain (Left) 06/25/2015  . History of methicillin resistant staphylococcus aureus (MRSA) 06/25/2015  . Mitral valve prolapse 06/25/2015  . Seasonal allergies 06/17/2015  . History of breast cancer 06/17/2015  . Acute on chronic diastolic CHF (congestive heart failure) ([place]) 12/26/2014  . Centrilobular emphysema ([place]) 09/16/2014  . S/P cardiac catheterization 04/04/2014  . DDD (degenerative disc disease), lumbosacral 04/03/2014  . Benign hypertension with CKD (chronic kidney disease) stage III ([place]) 04/03/2014  . Lumbar central spinal stenosis 04/03/2014  . Chronic kidney disease (CKD), stage III (moderate) ([place]) 10/30/2013  . Gout 10/30/2013   PCP:  Olin Hauser, DO Pharmacy:   [place], [place] - [address] [place] [address] Phone: [phone] Fax: [phone]  Readmission Risk Interventions Readmission Risk Prevention Plan 05/25/2020  Transportation Screening Complete  PCP or Specialist Appt within 3-5 Days Not Complete  Not Complete comments Sunday discharge PCP not available  HRI or  [place] Complete  Social Work Consult for [place] Planning/Counseling Complete  Palliative Care Screening Not Applicable  Medication Review Press photographer) Complete  Some recent data might be hidden

## 2020-08-05 LAB — CBC WITH DIFFERENTIAL/PLATELET
Abs Immature Granulocytes: 0.03 10*3/uL (ref 0.00–0.07)
Basophils Absolute: 0.1 10*3/uL (ref 0.0–0.1)
Basophils Relative: 1 %
Eosinophils Absolute: 0.6 10*3/uL — ABNORMAL HIGH (ref 0.0–0.5)
Eosinophils Relative: 7 %
HCT: 38.8 % (ref 36.0–46.0)
Hemoglobin: 11.9 g/dL — ABNORMAL LOW (ref 12.0–15.0)
Immature Granulocytes: 0 %
Lymphocytes Relative: 18 %
Lymphs Abs: 1.6 10*3/uL (ref 0.7–4.0)
MCH: 29.8 pg (ref 26.0–34.0)
MCHC: 30.7 g/dL (ref 30.0–36.0)
MCV: 97 fL (ref 80.0–100.0)
Monocytes Absolute: 0.7 10*3/uL (ref 0.1–1.0)
Monocytes Relative: 7 %
Neutro Abs: 6.1 10*3/uL (ref 1.7–7.7)
Neutrophils Relative %: 67 %
Platelets: 141 10*3/uL — ABNORMAL LOW (ref 150–400)
RBC: 4 MIL/uL (ref 3.87–5.11)
RDW: 14.1 % (ref 11.5–15.5)
WBC: 9.1 10*3/uL (ref 4.0–10.5)
nRBC: 0 % (ref 0.0–0.2)

## 2020-08-05 LAB — BASIC METABOLIC PANEL
Anion gap: 9 (ref 5–15)
BUN: 28 mg/dL — ABNORMAL HIGH (ref 8–23)
CO2: 25 mmol/L (ref 22–32)
Calcium: 9.3 mg/dL (ref 8.9–10.3)
Chloride: 103 mmol/L (ref 98–111)
Creatinine, Ser: 1.05 mg/dL — ABNORMAL HIGH (ref 0.44–1.00)
GFR, Estimated: 51 mL/min — ABNORMAL LOW (ref >60)
Glucose, Bld: 95 mg/dL (ref 70–99)
Potassium: 4.1 mmol/L (ref 3.5–5.1)
Sodium: 137 mmol/L (ref 135–145)

## 2020-08-05 LAB — PHOSPHORUS: Phosphorus: 3 mg/dL (ref 2.5–4.6)

## 2020-08-05 LAB — MAGNESIUM: Magnesium: 2 mg/dL (ref 1.7–2.4)

## 2020-08-05 MED ORDER — SODIUM CHLORIDE 0.9 % IV SOLN
2.0000 g | Freq: Once | INTRAVENOUS | Status: AC
  Administered 2020-08-05: 2 g via INTRAVENOUS
  Filled 2020-08-05: qty 20

## 2020-08-05 MED ORDER — CEPHALEXIN 500 MG PO CAPS: 500.0000 mg | ORAL_CAPSULE | Freq: Two times a day (BID) | ORAL | 0 refills | Status: AC

## 2020-08-05 NOTE — TOC Transition Note (Addendum)
Transition of Care [facility]) - CM/SW Discharge Note   Patient Details  Name: Kristin [name] MRN: [id number] Date of Birth: 03/22/1930  [facility]) CM/SW Contact:  Salome Arnt, LCSW Phone Number: 08/05/2020, 9:54 AM   Clinical Narrative:  Pt d/c today and will return home with home health services. Home health arranged with [facility] is aware of d/c. PT order is in and [facility] states they will add OT. Pt notified that [facility] will call to schedule visits. She states her son will pick her up from hospital. RN updated. No other needs reported.      Final next level of care: [place] Barriers to Discharge: Barriers Resolved   Patient Goals and CMS Choice Patient states their goals for this hospitalization and ongoing recovery are:: Discharge home with [facility] CMS Medicare.gov Compare Post Acute Care list provided to:: Patient Choice offered to / list presented to : Patient  Discharge Placement                  Name of family member notified: Pt only Patient and family notified of of transfer: 08/05/20  Discharge Plan and Services In-house Referral: Clinical Social Work Discharge Planning Services: NA Post Acute Care Choice: Home Health          DME Arranged: N/A DME Agency: NA       HH Arranged: PT [place] Agency: [place] Date [facility] Agency Contacted: 08/05/20 Time [place] Agency Contacted: [phone] Representative spoke with at [place]: [place] ([place]) Interventions     Readmission Risk Interventions Readmission Risk Prevention Plan 05/25/2020  Transportation Screening Complete  PCP or Specialist Appt within 3-5 Days Not Complete  Not Complete comments Sunday discharge PCP not available  HRI or [place] Complete  Social Work Consult for [place] Planning/Counseling Complete  Palliative Care Screening Not Applicable  Medication Review Press photographer) Complete  Some recent data might be  hidden

## 2020-08-05 NOTE — Progress Notes (Signed)
Pt has d/c orders. Discharge teaching given. Awaiting arrival of son to transport pt home.

## 2020-08-05 NOTE — Discharge Summary (Signed)
Physician Discharge Summary  ARNOLA CRITTENDON JJO:[id number] DOB: Apr 27, 1930 DOA: 08/02/2020  PCP: Olin Hauser, DO  Admit date: 08/02/2020 Discharge date: 08/05/2020  Admitted From: Home Disposition: Home with home health  Recommendations for Outpatient Follow-up:  1. Follow up with PCP in 1-2 weeks 2. Please obtain BMP/CBC in one week   Home Health: Home health PT Equipment/Devices: Oxygen present at home  Discharge Condition: Stable CODE STATUS: Full code Diet recommendation: Low-salt diet  Discharge summary: 86 year old female with history of restless leg syndrome, overactive bladder, hypertension, hyperlipidemia, hepatitis C, gout, COPD, CKD stage IIIb, chronic diastolic heart failure, breast cancer, paroxysmal A. fib, pacemaker presented to the ER with shortness of breath and cough.  According to the patient, she has some lung conditions and uses oxygen at home.  On chart review, it looks like patient has chronic interstitial lung disease and diastolic heart failure.  Patient started getting more shortness of breath so came to the hospital last night.  Was also recently seen at her primary care physician's office for worsening leg edema for which she was asked to take extra doses of Lasix.  Also complains of leg pain.  She also recovered recently from COVID-19 pneumonia.  In the emergency room, temperature 100.5.  On 3 L oxygen.  WBC count 18,000.  Chest x-ray with bilateral interstitial lung disease, basal infiltrate versus atelectasis.  Started on antibiotics.  EKG with QTC of 509.  Patient was admitted to the hospital and treated with Rocephin.  Initially started on azithromycin but discontinued with chronic prolonged QTC.  Treated with bronchodilator therapy.  Blood cultures negative.  Did good clinical recovery.  Already received 3 days of IV antibiotics.  Will continue 4 more days of oral Omnicef.  Patient is on 2 L oxygen and back to her baseline.  She has diastolic  congestive heart failure and that is stable and she is euvolemic.  Patient is therapeutic on Eliquis.  She is on gabapentin and chronic opiate therapy for pain management that she will continue.  Stable to discharge home.  She will benefit with home health therapies with physical therapy to increase her strength and mobility.    Discharge Diagnoses:  Principal Problem:   Acute respiratory failure with hypoxia (HCC) Active Problems:   Acute on chronic diastolic CHF (congestive heart failure) ([facility])   Community acquired pneumonia   Prolonged QT interval   Pneumonia    Discharge Instructions  Discharge Instructions    Call MD for:  difficulty breathing, headache or visual disturbances   Complete by: As directed    Call MD for:  temperature >100.4   Complete by: As directed    Diet - low sodium heart healthy   Complete by: As directed    Increase activity slowly   Complete by: As directed      Allergies as of 08/05/2020      Reactions   Flexeril [cyclobenzaprine Hcl]    Severe Rash   Vancomycin Rash   Red [place] Syndrome      Medication List    TAKE these medications   acetaminophen 500 MG tablet Commonly known as: TYLENOL Take 500 mg by mouth 3 (three) times daily.   albuterol (2.5 MG/3ML) 0.083% nebulizer solution Commonly known as: PROVENTIL Take 3 mLs (2.5 mg total) by nebulization every 8 (eight) hours as needed for wheezing or shortness of breath.   apixaban 5 MG Tabs tablet Commonly known as: ELIQUIS Take 1 tablet (5 mg total) by mouth 2 (  two) times daily. What changed: additional instructions   Benefiber Powd Take 6 g by mouth 3 (three) times daily before meals. (2 tsp = 6 g)   bisacodyl 5 MG EC tablet Commonly known as: DULCOLAX Take 2 tablets (10 mg total) by mouth daily as needed for moderate constipation.   Calcium Carbonate-Vitamin D 600-200 MG-UNIT Tabs Take by mouth.   cephALEXin 500 MG capsule Commonly known as: KEFLEX Take 1 capsule (500 mg  total) by mouth 2 (two) times daily for 4 days.   dextromethorphan-guaiFENesin 30-600 MG 12hr tablet Commonly known as: MUCINEX DM Take 1 tablet by mouth 2 (two) times daily.   diclofenac Sodium 1 % Gel Commonly known as: VOLTAREN Apply topically 4 (four) times daily.   furosemide 40 MG tablet Commonly known as: LASIX Take 1 tablet (40 mg total) by mouth daily as needed for fluid or edema.   gabapentin 100 MG capsule Commonly known as: NEURONTIN TAKE 1 CAPSULE BY MOUTH ONCE DAILY THEN INCREASE EVERY 2 TO 3 DAYS UP TO TAKING THREE TIMES DAILY OR 3 CAPSULES AT BEDTIME What changed:   how much to take  how to take this  when to take this  additional instructions   levocetirizine 5 MG tablet Commonly known as: XYZAL Take 0.5 tablets (2.5 mg total) by mouth every other day.   Melatonin 10 MG Caps Take 10 mg by mouth at bedtime.   mupirocin ointment 2 % Commonly known as: Bactroban Apply 1 application topically 2 (two) times daily as needed. For up to 7-10 days as needed to help heal and prevent infection.   Oxycodone HCl 20 MG Tabs Take 1 tablet (20 mg total) by mouth every 8 (eight) hours as needed. Must last 30 days   Oxycodone HCl 20 MG Tabs Take 1 tablet (20 mg total) by mouth every 8 (eight) hours as needed. Must last 30 days   Oxycodone HCl 20 MG Tabs Take 1 tablet (20 mg total) by mouth every 8 (eight) hours as needed. Must last 30 days Start taking on: August 25, 2020   OXYGEN Inhale 2 L into the lungs at bedtime.   polyethylene glycol 17 g packet Commonly known as: MIRALAX / GLYCOLAX Take 17 g by mouth daily as needed for moderate constipation.   rOPINIRole 2 MG tablet Commonly known as: Requip Take 2 tablets (4 mg total) by mouth at bedtime.   Vitamin D 50 MCG (2000 UT) tablet Take 2,000 Units by mouth daily.       Follow-up Information    [facility] Follow up.   Specialty: Home Health Services Why: Will contact you to schedule  home health visits.  Contact information: [place] [address] [phone]              Allergies  Allergen Reactions  . Flexeril [Cyclobenzaprine Hcl]     Severe Rash  . Vancomycin Rash    Red [place] Syndrome    Consultations:  None   Procedures/Studies: DG CHEST PORT 1 VIEW  Result Date: 08/03/2020 CLINICAL DATA:  Hypoxia EXAM: PORTABLE CHEST 1 VIEW COMPARISON:  08/02/2020 FINDINGS: Normal cardiac silhouette. Diffuse interstitial opacities unchanged. No clear focal consolidation. No pleural fluid. Density over RIGHT lung bases a similar to improved. No pneumothorax. IMPRESSION: 1. Chronic interstitial opacities. 2. Cannot exclude superimposed edema. 3. RIGHT basilar opacity is slightly improved suggesting improving atelectasis. Electronically Signed   By: Suzy Bouchard M.D.   On: 08/03/2020 05:49  DG Chest Portable 1 View  Result Date: 08/02/2020 CLINICAL DATA:  Shortness of breath, fever. EXAM: PORTABLE CHEST 1 VIEW COMPARISON:  July 16, 2020. FINDINGS: Stable cardiomediastinal silhouette. Left-sided pacemaker is unchanged in position. No pneumothorax is noted. Status post bilateral shoulder arthroplasties. Stable interstitial opacities are noted throughout both lungs which may represent edema and some degree of chronic interstitial lung disease. Increased right basilar atelectasis or infiltrate is noted with probable small right pleural effusion. IMPRESSION: Increased right basilar atelectasis or infiltrate is noted with probable small right pleural effusion. Stable interstitial opacities are noted throughout both lungs which may represent edema and some degree of chronic interstitial lung disease. Electronically Signed   By: Marijo Conception M.D.   On: 08/02/2020 13:30   (Echo, Carotid, EGD, Colonoscopy, ERCP)    Subjective: Patient was seen and examined.  No overnight events.  Eager to go home.  She wanted to make sure I discharged her  today early morning.  Denies any cough or wheezing.  She thinks she is at her usual self and comfortable on her 2 L oxygen.   Discharge Exam: Vitals:   08/04/20 2125 08/05/20 0507  BP: (!) 103/53 114/62  Pulse: 60 60  Resp: 17 18  Temp: 99.3 F (37.4 C) 99 F (37.2 C)  SpO2: 96% 98%   Vitals:   08/04/20 0500 08/04/20 1630 08/04/20 2125 08/05/20 0507  BP: 120/68 (!) 103/58 (!) 103/53 114/62  Pulse: 60 64 60 60  Resp: 18 20 17 18   Temp: 98.8 F (37.1 C) 98.3 F (36.8 C) 99.3 F (37.4 C) 99 F (37.2 C)  TempSrc: Oral   Oral  SpO2: 99% 96% 96% 98%  Weight: 68.8 kg     Height:        General: Pt is alert, awake, not in acute distress Cardiovascular: RRR, S1/S2 +, no rubs, no gallops Respiratory: CTA bilaterally, no wheezing, no rhonchi, no added sounds.  She looks fairly comfortable on 2 L oxygen. Abdominal: Soft, NT, ND, bowel sounds + Extremities: no edema, no cyanosis She has no palpable tenderness.  She has hyperesthesia of the legs.   The results of significant diagnostics from this hospitalization (including imaging, microbiology, ancillary and laboratory) are listed below for reference.     Microbiology: Recent Results (from the past 240 hour(s))  Resp Panel by RT-PCR (Flu A&B, Covid) Nasopharyngeal Swab     Status: None   Collection Time: 08/02/20 10:54 PM   Specimen: Nasopharyngeal Swab; Nasopharyngeal(NP) swabs in vial transport medium  Result Value Ref Range Status   SARS Coronavirus 2 by RT PCR NEGATIVE NEGATIVE Final    Comment: (NOTE) SARS-CoV-2 target nucleic acids are NOT DETECTED.  The SARS-CoV-2 RNA is generally detectable in upper respiratory specimens during the acute phase of infection. The lowest concentration of SARS-CoV-2 viral copies this assay can detect is 138 copies/mL. A negative result does not preclude SARS-Cov-2 infection and should not be used as the sole basis for treatment or other patient management decisions. A negative result  may occur with  improper specimen collection/handling, submission of specimen other than nasopharyngeal swab, presence of viral mutation(s) within the areas targeted by this assay, and inadequate number of viral copies(<138 copies/mL). A negative result must be combined with clinical observations, patient history, and epidemiological information. The expected result is Negative.  Fact Sheet for Patients:  EntrepreneurPulse.com.au  Fact Sheet for Healthcare Providers:  IncredibleEmployment.be  This test is no t yet approved or cleared by the [place]  States FDA and  has been authorized for detection and/or diagnosis of SARS-CoV-2 by FDA under an Emergency Use Authorization (EUA). This EUA will remain  in effect (meaning this test can be used) for the duration of the COVID-19 declaration under Section 564(b)(1) of the Act, 21 U.S.C.section 360bbb-3(b)(1), unless the authorization is terminated  or revoked sooner.       Influenza A by PCR NEGATIVE NEGATIVE Final   Influenza B by PCR NEGATIVE NEGATIVE Final    Comment: (NOTE) The Xpert Xpress SARS-CoV-2/FLU/RSV plus assay is intended as an aid in the diagnosis of influenza from Nasopharyngeal swab specimens and should not be used as a sole basis for treatment. Nasal washings and aspirates are unacceptable for Xpert Xpress SARS-CoV-2/FLU/RSV testing.  Fact Sheet for Patients: EntrepreneurPulse.com.au  Fact Sheet for Healthcare Providers: IncredibleEmployment.be  This test is not yet approved or cleared by the [place] FDA and has been authorized for detection and/or diagnosis of SARS-CoV-2 by FDA under an Emergency Use Authorization (EUA). This EUA will remain in effect (meaning this test can be used) for the duration of the COVID-19 declaration under Section 564(b)(1) of the Act, 21 U.S.C. section 360bbb-3(b)(1), unless the authorization is terminated  or revoked.  Performed at [facility], [address], [place], [place] [address]      Labs: BNP (last 3 results) Recent Labs    05/23/20 2021 08/02/20 2014  BNP 994.0* 8,299.3*   Basic Metabolic Panel: Recent Labs  Lab 08/02/20 1703 08/03/20 0637 08/05/20 0611  NA 138 140 137  K 3.8 3.4* 4.1  CL 103 101 103  CO2 23 27 25   GLUCOSE 84 94 95  BUN 25* 22 28*  CREATININE 1.22* 1.07* 1.05*  CALCIUM 9.1 9.4 9.3  MG  --  2.0 2.0  PHOS  --   --  3.0   Liver Function Tests: Recent Labs  Lab 08/03/20 0637  AST 31  ALT 22  ALKPHOS 68  BILITOT 1.6*  PROT 7.4  ALBUMIN 3.7   No results for input(s): LIPASE, AMYLASE in the last 168 hours. No results for input(s): AMMONIA in the last 168 hours. CBC: Recent Labs  Lab 08/02/20 2014 08/03/20 0637 08/05/20 0611  WBC 18.0* 15.2* 9.1  NEUTROABS 15.0* 12.5* 6.1  HGB 13.0 13.7 11.9*  HCT 42.0 46.0 38.8  MCV 97.2 97.3 97.0  PLT 125* 131* 141*   Cardiac Enzymes: No results for input(s): CKTOTAL, CKMB, CKMBINDEX, TROPONINI in the last 168 hours. BNP: Invalid input(s): POCBNP CBG: No results for input(s): GLUCAP in the last 168 hours. D-Dimer No results for input(s): DDIMER in the last 72 hours. Hgb A1c No results for input(s): HGBA1C in the last 72 hours. Lipid Profile No results for input(s): CHOL, HDL, LDLCALC, TRIG, CHOLHDL, LDLDIRECT in the last 72 hours. Thyroid function studies No results for input(s): TSH, T4TOTAL, T3FREE, THYROIDAB in the last 72 hours.  Invalid input(s): FREET3 Anemia work up No results for input(s): VITAMINB12, FOLATE, FERRITIN, TIBC, IRON, RETICCTPCT in the last 72 hours. Urinalysis    Component Value Date/Time   COLORURINE YELLOW 05/24/2020 0115   APPEARANCEUR HAZY (A) 05/24/2020 0115   APPEARANCEUR Clear 09/03/2015 1036   LABSPEC 1.017 05/24/2020 0115   PHURINE 6.0 05/24/2020 0115   GLUCOSEU NEGATIVE 05/24/2020 0115   HGBUR SMALL (A) 05/24/2020 0115   BILIRUBINUR NEGATIVE  05/24/2020 0115   BILIRUBINUR neg 11/08/2016 1519   BILIRUBINUR Negative 09/03/2015 1036   KETONESUR NEGATIVE 05/24/2020 0115   PROTEINUR 30 (A)  05/24/2020 0115   UROBILINOGEN 0.2 11/08/2016 1519   UROBILINOGEN 0.2 02/17/2011 1050   NITRITE NEGATIVE 05/24/2020 0115   LEUKOCYTESUR MODERATE (A) 05/24/2020 0115   Sepsis Labs Invalid input(s): PROCALCITONIN,  WBC,  LACTICIDVEN Microbiology Recent Results (from the past 240 hour(s))  Resp Panel by RT-PCR (Flu A&B, Covid) Nasopharyngeal Swab     Status: None   Collection Time: 08/02/20 10:54 PM   Specimen: Nasopharyngeal Swab; Nasopharyngeal(NP) swabs in vial transport medium  Result Value Ref Range Status   SARS Coronavirus 2 by RT PCR NEGATIVE NEGATIVE Final    Comment: (NOTE) SARS-CoV-2 target nucleic acids are NOT DETECTED.  The SARS-CoV-2 RNA is generally detectable in upper respiratory specimens during the acute phase of infection. The lowest concentration of SARS-CoV-2 viral copies this assay can detect is 138 copies/mL. A negative result does not preclude SARS-Cov-2 infection and should not be used as the sole basis for treatment or other patient management decisions. A negative result may occur with  improper specimen collection/handling, submission of specimen other than nasopharyngeal swab, presence of viral mutation(s) within the areas targeted by this assay, and inadequate number of viral copies(<138 copies/mL). A negative result must be combined with clinical observations, patient history, and epidemiological information. The expected result is Negative.  Fact Sheet for Patients:  EntrepreneurPulse.com.au  Fact Sheet for Healthcare Providers:  IncredibleEmployment.be  This test is no t yet approved or cleared by the [place] FDA and  has been authorized for detection and/or diagnosis of SARS-CoV-2 by FDA under an Emergency Use Authorization (EUA). This EUA will remain  in  effect (meaning this test can be used) for the duration of the COVID-19 declaration under Section 564(b)(1) of the Act, 21 U.S.C.section 360bbb-3(b)(1), unless the authorization is terminated  or revoked sooner.       Influenza A by PCR NEGATIVE NEGATIVE Final   Influenza B by PCR NEGATIVE NEGATIVE Final    Comment: (NOTE) The Xpert Xpress SARS-CoV-2/FLU/RSV plus assay is intended as an aid in the diagnosis of influenza from Nasopharyngeal swab specimens and should not be used as a sole basis for treatment. Nasal washings and aspirates are unacceptable for Xpert Xpress SARS-CoV-2/FLU/RSV testing.  Fact Sheet for Patients: EntrepreneurPulse.com.au  Fact Sheet for Healthcare Providers: IncredibleEmployment.be  This test is not yet approved or cleared by the [place] FDA and has been authorized for detection and/or diagnosis of SARS-CoV-2 by FDA under an Emergency Use Authorization (EUA). This EUA will remain in effect (meaning this test can be used) for the duration of the COVID-19 declaration under Section 564(b)(1) of the Act, 21 U.S.C. section 360bbb-3(b)(1), unless the authorization is terminated or revoked.  Performed at [facility], [address] [place], [place] [address]      Time coordinating discharge:  35 minutes  SIGNED:   Barb Merino, MD  [facility] 08/05/2020, 1:00 PM

## 2020-08-06 ENCOUNTER — Telehealth: Payer: Self-pay

## 2020-08-06 ENCOUNTER — Ambulatory Visit (INDEPENDENT_AMBULATORY_CARE_PROVIDER_SITE_OTHER): Payer: Medicare HMO | Admitting: Vascular Surgery

## 2020-08-06 NOTE — Telephone Encounter (Signed)
Transition Care Management Follow-up Telephone Call  Date of discharge and from where: 08/05/2020 [facility]  How have you been since you were released from the hospital? Better, but weak  Any questions or concerns? No  Items Reviewed:  Did the pt receive and understand the discharge instructions provided? Yes   Medications obtained and verified? Yes   Other? No   Any new allergies since your discharge? No   Dietary orders reviewed? Yes  Do you have support at home? Yes   Home Care and Equipment/Supplies: Were home health services ordered? yes If so, what is the name of the agency? [facility]  Has the agency set up a time to come to the patient's home? no Were any new equipment or medical supplies ordered?  No What is the name of the medical supply agency? n/a Were you able to get the supplies/equipment? not applicable Do you have any questions related to the use of the equipment or supplies? No  Functional Questionnaire: (I = Independent and D = Dependent) ADLs: I  Bathing/Dressing- I  Meal Prep- I  Eating- I  Maintaining continence- I  Transferring/Ambulation- I  Managing Meds- I  Follow up appointments reviewed:   PCP Hospital f/u appt confirmed? Yes  Scheduled to see Dr. Parks Ranger on 08/12/2020 @ 11:20.  Are transportation arrangements needed? No   If their condition worsens, is the pt aware to call PCP or go to the Emergency Dept.? Yes  Was the patient provided with contact information for the PCP's office or ED? Yes  Was to pt encouraged to call back with questions or concerns? Yes

## 2020-08-09 DIAGNOSIS — I5033 Acute on chronic diastolic (congestive) heart failure: Secondary | ICD-10-CM | POA: Diagnosis not present

## 2020-08-09 DIAGNOSIS — N189 Chronic kidney disease, unspecified: Secondary | ICD-10-CM | POA: Diagnosis not present

## 2020-08-09 DIAGNOSIS — J9601 Acute respiratory failure with hypoxia: Secondary | ICD-10-CM | POA: Diagnosis not present

## 2020-08-09 DIAGNOSIS — I0981 Rheumatic heart failure: Secondary | ICD-10-CM | POA: Diagnosis not present

## 2020-08-09 DIAGNOSIS — M1030 Gout due to renal impairment, unspecified site: Secondary | ICD-10-CM | POA: Diagnosis not present

## 2020-08-09 DIAGNOSIS — J432 Centrilobular emphysema: Secondary | ICD-10-CM | POA: Diagnosis not present

## 2020-08-09 DIAGNOSIS — J189 Pneumonia, unspecified organism: Secondary | ICD-10-CM | POA: Diagnosis not present

## 2020-08-09 DIAGNOSIS — I480 Paroxysmal atrial fibrillation: Secondary | ICD-10-CM | POA: Diagnosis not present

## 2020-08-09 DIAGNOSIS — I10 Essential (primary) hypertension: Secondary | ICD-10-CM | POA: Diagnosis not present

## 2020-08-09 DIAGNOSIS — I130 Hypertensive heart and chronic kidney disease with heart failure and stage 1 through stage 4 chronic kidney disease, or unspecified chronic kidney disease: Secondary | ICD-10-CM | POA: Diagnosis not present

## 2020-08-10 DIAGNOSIS — J45998 Other asthma: Secondary | ICD-10-CM | POA: Diagnosis not present

## 2020-08-11 ENCOUNTER — Other Ambulatory Visit: Payer: Self-pay

## 2020-08-11 ENCOUNTER — Emergency Department (HOSPITAL_COMMUNITY)
Admission: EM | Admit: 2020-08-11 | Discharge: 2020-08-11 | Disposition: A | Discharge status: Home / Self Care | Payer: Medicare HMO | Attending: Emergency Medicine | Admitting: Emergency Medicine

## 2020-08-11 ENCOUNTER — Emergency Department (HOSPITAL_COMMUNITY): Payer: Medicare HMO

## 2020-08-11 ENCOUNTER — Encounter (HOSPITAL_COMMUNITY): Payer: Self-pay

## 2020-08-11 DIAGNOSIS — Z7901 Long term (current) use of anticoagulants: Secondary | ICD-10-CM | POA: Diagnosis not present

## 2020-08-11 DIAGNOSIS — Z853 Personal history of malignant neoplasm of breast: Secondary | ICD-10-CM | POA: Insufficient documentation

## 2020-08-11 DIAGNOSIS — N183 Chronic kidney disease, stage 3 unspecified: Secondary | ICD-10-CM | POA: Insufficient documentation

## 2020-08-11 DIAGNOSIS — Z87891 Personal history of nicotine dependence: Secondary | ICD-10-CM | POA: Insufficient documentation

## 2020-08-11 DIAGNOSIS — H52203 Unspecified astigmatism, bilateral: Secondary | ICD-10-CM | POA: Diagnosis not present

## 2020-08-11 DIAGNOSIS — E860 Dehydration: Secondary | ICD-10-CM | POA: Insufficient documentation

## 2020-08-11 DIAGNOSIS — I959 Hypotension, unspecified: Secondary | ICD-10-CM | POA: Diagnosis not present

## 2020-08-11 DIAGNOSIS — I509 Heart failure, unspecified: Secondary | ICD-10-CM | POA: Diagnosis not present

## 2020-08-11 DIAGNOSIS — J189 Pneumonia, unspecified organism: Secondary | ICD-10-CM | POA: Diagnosis not present

## 2020-08-11 DIAGNOSIS — Z79899 Other long term (current) drug therapy: Secondary | ICD-10-CM | POA: Insufficient documentation

## 2020-08-11 DIAGNOSIS — Z8616 Personal history of COVID-19: Secondary | ICD-10-CM | POA: Diagnosis not present

## 2020-08-11 DIAGNOSIS — H353131 Nonexudative age-related macular degeneration, bilateral, early dry stage: Secondary | ICD-10-CM | POA: Diagnosis not present

## 2020-08-11 DIAGNOSIS — I130 Hypertensive heart and chronic kidney disease with heart failure and stage 1 through stage 4 chronic kidney disease, or unspecified chronic kidney disease: Secondary | ICD-10-CM | POA: Insufficient documentation

## 2020-08-11 DIAGNOSIS — R400 Somnolence: Secondary | ICD-10-CM | POA: Insufficient documentation

## 2020-08-11 DIAGNOSIS — Z96653 Presence of artificial knee joint, bilateral: Secondary | ICD-10-CM | POA: Diagnosis not present

## 2020-08-11 DIAGNOSIS — J449 Chronic obstructive pulmonary disease, unspecified: Secondary | ICD-10-CM | POA: Diagnosis not present

## 2020-08-11 DIAGNOSIS — Z961 Presence of intraocular lens: Secondary | ICD-10-CM | POA: Diagnosis not present

## 2020-08-11 DIAGNOSIS — H5211 Myopia, right eye: Secondary | ICD-10-CM | POA: Diagnosis not present

## 2020-08-11 DIAGNOSIS — H524 Presbyopia: Secondary | ICD-10-CM | POA: Diagnosis not present

## 2020-08-11 DIAGNOSIS — R531 Weakness: Secondary | ICD-10-CM | POA: Diagnosis not present

## 2020-08-11 DIAGNOSIS — R5381 Other malaise: Secondary | ICD-10-CM | POA: Diagnosis not present

## 2020-08-11 LAB — URINALYSIS, ROUTINE W REFLEX MICROSCOPIC
Bacteria, UA: NONE SEEN
Bilirubin Urine: NEGATIVE
Glucose, UA: NEGATIVE mg/dL
Ketones, ur: NEGATIVE mg/dL
Nitrite: NEGATIVE
Protein, ur: NEGATIVE mg/dL
Specific Gravity, Urine: 1.014 (ref 1.005–1.030)
pH: 5 (ref 5.0–8.0)

## 2020-08-11 LAB — CBC WITH DIFFERENTIAL/PLATELET
Abs Immature Granulocytes: 0.01 10*3/uL (ref 0.00–0.07)
Basophils Absolute: 0.1 10*3/uL (ref 0.0–0.1)
Basophils Relative: 1 %
Eosinophils Absolute: 0.5 10*3/uL (ref 0.0–0.5)
Eosinophils Relative: 11 %
HCT: 40.2 % (ref 36.0–46.0)
Hemoglobin: 11.9 g/dL — ABNORMAL LOW (ref 12.0–15.0)
Immature Granulocytes: 0 %
Lymphocytes Relative: 46 %
Lymphs Abs: 2.3 10*3/uL (ref 0.7–4.0)
MCH: 30 pg (ref 26.0–34.0)
MCHC: 29.6 g/dL — ABNORMAL LOW (ref 30.0–36.0)
MCV: 101.3 fL — ABNORMAL HIGH (ref 80.0–100.0)
Monocytes Absolute: 0.5 10*3/uL (ref 0.1–1.0)
Monocytes Relative: 9 %
Neutro Abs: 1.7 10*3/uL (ref 1.7–7.7)
Neutrophils Relative %: 33 %
Platelets: 141 10*3/uL — ABNORMAL LOW (ref 150–400)
RBC: 3.97 MIL/uL (ref 3.87–5.11)
RDW: 14 % (ref 11.5–15.5)
WBC: 5 10*3/uL (ref 4.0–10.5)
nRBC: 0 % (ref 0.0–0.2)

## 2020-08-11 LAB — BASIC METABOLIC PANEL
Anion gap: 11 (ref 5–15)
Anion gap: 11 (ref 5–15)
BUN: 58 mg/dL — ABNORMAL HIGH (ref 8–23)
BUN: 52 mg/dL — ABNORMAL HIGH (ref 8–23)
CO2: 17 mmol/L — ABNORMAL LOW (ref 22–32)
CO2: 18 mmol/L — ABNORMAL LOW (ref 22–32)
Calcium: 9.4 mg/dL (ref 8.9–10.3)
Calcium: 9.5 mg/dL (ref 8.9–10.3)
Chloride: 110 mmol/L (ref 98–111)
Chloride: 109 mmol/L (ref 98–111)
Creatinine, Ser: 2.09 mg/dL — ABNORMAL HIGH (ref 0.44–1.00)
Creatinine, Ser: 1.8 mg/dL — ABNORMAL HIGH (ref 0.44–1.00)
GFR, Estimated: 22 mL/min — ABNORMAL LOW (ref >60)
GFR, Estimated: 27 mL/min — ABNORMAL LOW (ref >60)
Glucose, Bld: 72 mg/dL (ref 70–99)
Glucose, Bld: 84 mg/dL (ref 70–99)
Potassium: 4.9 mmol/L (ref 3.5–5.1)
Potassium: 5.2 mmol/L — ABNORMAL HIGH (ref 3.5–5.1)
Sodium: 138 mmol/L (ref 135–145)
Sodium: 138 mmol/L (ref 135–145)

## 2020-08-11 MED ORDER — SODIUM CHLORIDE 0.9 % IV BOLUS
500.0000 mL | Freq: Once | INTRAVENOUS | Status: AC
  Administered 2020-08-11: 500 mL via INTRAVENOUS

## 2020-08-11 NOTE — ED Provider Notes (Signed)
[facility] EMERGENCY DEPARTMENT Provider Note   CSN: [id number] Arrival date & time: 08/11/20  1411     History Chief Complaint  Patient presents with  . drowsy    Kristin [name] is a 86 y.o. female history of A. fib, CHF, CKD, COPD, hepatitis C, hyperlipidemia, chronic pain, on Eliquis.  Patient arrives via EMS for concern of drowsiness since being discharged from the hospital after treatment of community-acquired pneumonia.  History obtained from patient: Patient reports that she is feeling well today, she was going to go to her eye doctors for evaluation she had taken 2 of her pain pills as she normally does prior to going due to her chronic back and leg pain.  She reports that she felt tired during the visit and tired throughout the day today and felt the need to sleep.  She has no complaints besides being tired.  Patient reports that she uses 2 L supplemental oxygen via nasal cannula at baseline.  Denies fever/chills, fall/injury, headache, vision changes, numbness/tingling, weakness, neck pain, chest pain, back pain, abdominal pain, cough/shortness of breath, extremity swelling/color change or any additional concerns.  Additional history obtained from patient's granddaughter and caregiver Kristin [name].  Kristin [name] reports that she called EMS today because the patient appeared more tired than normal.  She had been recovering well from recent admission from pneumonia, the patient seemed more tired today when they were going to the eye doctor's appointment.  She reports that this patient handles all of her own medications and is supposed to take one 20 mg oxycodone 3 times a day, she was unaware the patient doubled her dose this morning.  No recent falls or injury no nausea vomiting diarrhea or complaints of pain.  HPI     Past Medical History:  Diagnosis Date  . Acute postoperative pain 02/09/2018  . Arrhythmia, sinus node 04/03/2014  . Arthritis   . Arthritis   . Atrial fibrillation ([place])    . Back pain    lower back chronic  . Bradycardia   . Breast cancer ([place]) 2004   right breast cancer  . Cancer [facility]) 2004   rt breast cancer-post lumpectomy- chemo/rad  . CHF (congestive heart failure) ([place])   . Chronic back pain   . CKD (chronic kidney disease)   . COPD (chronic obstructive pulmonary disease) (HCC)    wears O2 at 2L via [place] at night  . Cystocele   . Decubitus ulcers   . Dehydration   . Gout   . Hematuria   . Hepatitis C   . Hyperlipidemia   . Hypertension   . Hypomagnesemia 06/25/2015  . OAB (overactive bladder)   . Overactive bladder   . Restless leg   . Shoulder pain, bilateral   . Urinary frequency   . UTI (lower urinary tract infection)   . Vaginal atrophy     Patient Active Problem List   Diagnosis Date Noted  . Pneumonia 08/03/2020  . Acute respiratory failure with hypoxia ([place]) 08/02/2020  . Community acquired pneumonia 08/02/2020  . Prolonged QT interval 08/02/2020  . Venous insufficiency of both lower extremities 07/04/2020  . COVID-19 06/11/2020  . Respiratory failure with hypoxia ([place]) 05/23/2020  . Varicose veins of left lower extremity with inflammation, with ulcer of ankle limited to breakdown of skin ([place]) 04/01/2020  . Uncomplicated opioid dependence ([place]) 02/14/2020  . Abnormal MRI, lumbar spine (11/03/2019) 01/23/2020  . Pharmacologic therapy 12/20/2019  . Other specified dorsopathies, sacral and sacrococcygeal region 06/12/2019  .  Chronic hip pain (Left) 06/12/2019  . Osteoarthritis of hip (Left) 06/12/2019  . Greater trochanteric bursitis of hip (Left) 03/12/2019  . Preop testing 01/22/2019  . Cervicalgia 10/03/2018  . Chronic shoulders pain (Bilateral) (L>R) 05/29/2018  . Osteoarthritis of shoulder (Bilateral) 05/29/2018  . Osteoarthritis of hip (Bilateral) 05/29/2018  . Chronic shoulder pain after shoulder replacement (Bilateral) (L>R) 05/29/2018  . Trigger finger, left middle finger 05/29/2018  . Spondylosis without  myelopathy or radiculopathy, lumbosacral region 02/09/2018  . Insomnia 02/01/2018  . Numbness and tingling of upper extremity (Bilateral) 09/07/2017  . Chronic shoulder pain, Radicular (Bilateral) (L>R) 09/07/2017  . Cervical facet syndrome (Bilateral) (L>R) 09/07/2017  . Cervical (3-4 mm) Anterolisthesis of C4 on C5 09/07/2017  . Cervical foraminal stenosis (Bilateral) 09/07/2017  . Chronic lower extremity pain (Secondary Area of Pain) (Bilateral) (L>R) 05/13/2017  . DDD (degenerative disc disease), cervical 05/13/2017  . Chronic cervical radicular pain [facility] Area of Pain) (Bilateral) (L>R) 05/04/2017  . Chronic anticoagulation (Eliquis) 05/04/2017  . Chronic hip pain (Bilateral) 04/20/2017  . Chronic neck pain 03/01/2017  . Hyperlipidemia 02/02/2017  . Red blood cell antibody positive 11/18/2016  . Anticoagulated 11/17/2016  . Atrial fibrillation and flutter ([place]) 11/17/2016  . Osteoarthritis involving multiple joints 06/14/2016  . Cardiac pacemaker in situ 01/07/2016  . S/P cardiac pacemaker procedure 01/07/2016  . GERD (gastroesophageal reflux disease) 12/18/2015  . Chronic pain syndrome 06/25/2015  . Chronic low back pain (Primary Area of Pain) (Bilateral) (L>R) 06/25/2015  . Lumbar facet syndrome (Bilateral) (L>R) 06/25/2015  . Lumbar spondylosis 06/25/2015  . Chronic lumbar radicular pain (Bilateral) (L>R) 06/25/2015  . Restless leg syndrome 06/25/2015  . Myofascial pain 06/25/2015  . Neurogenic pain 06/25/2015  . Long term current use of opiate analgesic 06/25/2015  . Long term prescription opiate use 06/25/2015  . Opiate use (90 MME/Day) 06/25/2015  . Opioid-induced constipation (OIC) 06/25/2015  . Vitamin D insufficiency 06/25/2015  . Grade 1 Retrolisthesis of L1 over L2 06/25/2015  . Lumbar facet hypertrophy (L1-2, L2-3, and L4-5) 06/25/2015  . Lumbar lateral recess stenosis (L1-2 and L4-5) 06/25/2015  . Failed back surgical syndrome (L4-5 left hemilaminectomy  laminectomy) 06/25/2015  . Epidural fibrosis 06/25/2015  . Lumbar foraminal stenosis (Severe Left L4-5) 06/25/2015  . Osteoporosis 06/25/2015  . Chronic sacroiliac joint pain (Left) 06/25/2015  . History of methicillin resistant staphylococcus aureus (MRSA) 06/25/2015  . Mitral valve prolapse 06/25/2015  . Seasonal allergies 06/17/2015  . History of breast cancer 06/17/2015  . Acute on chronic diastolic CHF (congestive heart failure) ([place]) 12/26/2014  . Centrilobular emphysema ([place]) 09/16/2014  . S/P cardiac catheterization 04/04/2014  . DDD (degenerative disc disease), lumbosacral 04/03/2014  . Benign hypertension with CKD (chronic kidney disease) stage III ([place]) 04/03/2014  . Lumbar central spinal stenosis 04/03/2014  . Chronic kidney disease (CKD), stage III (moderate) ([place]) 10/30/2013  . Gout 10/30/2013    Past Surgical History:  Procedure Laterality Date  . ABDOMINAL HYSTERECTOMY    . BACK SURGERY    . BREAST LUMPECTOMY Right 2004  . BREAST SURGERY     rt lumpectomy  . CARPAL TUNNEL RELEASE    . CATARACT EXTRACTION W/PHACO  10/19/2011   Procedure: CATARACT EXTRACTION PHACO AND INTRAOCULAR LENS PLACEMENT (IOC);  Surgeon: Elta Guadeloupe T. Gershon Crane, MD;  Location: [facility];  Service: Ophthalmology;  Laterality: Right;  CDE:10.81  . CATARACT EXTRACTION W/PHACO  11/02/2011   Procedure: CATARACT EXTRACTION PHACO AND INTRAOCULAR LENS PLACEMENT (IOC);  Surgeon: Elta Guadeloupe T. Gershon Crane, MD;  Location:  [facility] ORS;  Service: Ophthalmology;  Laterality: Left;  CDE 8.60  . CHOLECYSTECTOMY     3/18  . COLON SURGERY    . INSERT / REPLACE / REMOVE PACEMAKER    . JOINT REPLACEMENT     bilateral TKA  . PACEMAKER INSERTION N/A 12/24/2015   Procedure: INSERTION PACEMAKER;  Surgeon: Isaias Cowman, MD;  Location: [facility] ORS;  Service: Cardiovascular;  Laterality: N/A;  . ROTATOR CUFF REPAIR     bilateral     OB History   No obstetric history on file.     Family History  Problem Relation Age of Onset  .  Kidney disease Brother   . Heart disease Father   . Heart disease Mother   . Stroke Mother   . Cancer Sister   . Breast cancer Sister 69  . Breast cancer Paternal Aunt   . Anesthesia problems Neg Hx   . Hypotension Neg Hx   . Malignant hyperthermia Neg Hx   . Pseudochol deficiency Neg Hx     Social History   Tobacco Use  . Smoking status: Former Smoker    Packs/day: 1.25    Years: 40.00    Pack years: 50.00    Types: Cigarettes    Quit date: 12/16/1998    Years since quitting: 21.6  . Smokeless tobacco: Former Network engineer  . Vaping Use: Never used  Substance Use Topics  . Alcohol use: No    Alcohol/week: 0.0 standard drinks  . Drug use: Yes    Types: Oxycodone    Home Medications Prior to Admission medications   Medication Sig Start Date End Date Taking? Authorizing Provider  acetaminophen (TYLENOL) 500 MG tablet Take 500 mg by mouth 3 (three) times daily.    [provider]  albuterol (PROVENTIL) (2.5 MG/3ML) 0.083% nebulizer solution Take 3 mLs (2.5 mg total) by nebulization every 8 (eight) hours as needed for wheezing or shortness of breath. 10/02/19 10/01/20  Karamalegos, Devonne Doughty, DO  apixaban (ELIQUIS) 5 MG TABS tablet Take 1 tablet (5 mg total) by mouth 2 (two) times daily. Patient taking differently: Take 5 mg by mouth 2 (two) times daily. For stroke prevention 10/30/15   Loletha Grayer, MD  bisacodyl (DULCOLAX) 5 MG EC tablet Take 2 tablets (10 mg total) by mouth daily as needed for moderate constipation. 07/04/20   Karamalegos, Devonne Doughty, DO  Calcium Carbonate-Vitamin D 600-200 MG-UNIT TABS Take by mouth.    [provider]  Cholecalciferol (VITAMIN D) 2000 units tablet Take 2,000 Units by mouth daily.     [provider]  dextromethorphan-guaiFENesin (MUCINEX DM) 30-600 MG 12hr tablet Take 1 tablet by mouth 2 (two) times daily. 05/25/20   Kathie Dike, MD  diclofenac Sodium (VOLTAREN) 1 % GEL Apply topically 4 (four) times  daily.    [provider]  furosemide (LASIX) 40 MG tablet Take 1 tablet (40 mg total) by mouth daily as needed for fluid or edema. 07/28/20   Karamalegos, Devonne Doughty, DO  gabapentin (NEURONTIN) 100 MG capsule TAKE 1 CAPSULE BY MOUTH ONCE DAILY THEN INCREASE EVERY 2 TO 3 DAYS UP TO TAKING THREE TIMES DAILY OR 3 CAPSULES AT BEDTIME Patient taking differently: Take 300 mg by mouth at bedtime. 05/28/20   Karamalegos, Devonne Doughty, DO  levocetirizine (XYZAL) 5 MG tablet Take 0.5 tablets (2.5 mg total) by mouth every other day. 05/17/19   Mikey College, NP  Melatonin 10 MG CAPS Take 10 mg by mouth at bedtime.  06/26/20 09/24/20  Milinda Pointer, MD  mupirocin ointment (BACTROBAN) 2 % Apply 1 application topically 2 (two) times daily as needed. For up to 7-10 days as needed to help heal and prevent infection. 04/01/20   Karamalegos, Devonne Doughty, DO  Oxycodone HCl 20 MG TABS Take 1 tablet (20 mg total) by mouth every 8 (eight) hours as needed. Must last 30 days 06/26/20 07/26/20  Milinda Pointer, MD  Oxycodone HCl 20 MG TABS Take 1 tablet (20 mg total) by mouth every 8 (eight) hours as needed. Must last 30 days 07/26/20 08/25/20  Milinda Pointer, MD  Oxycodone HCl 20 MG TABS Take 1 tablet (20 mg total) by mouth every 8 (eight) hours as needed. Must last 30 days 08/25/20 09/24/20  Milinda Pointer, MD  OXYGEN Inhale 2 L into the lungs at bedtime.     [provider]  polyethylene glycol (MIRALAX / GLYCOLAX) 17 g packet Take 17 g by mouth daily as needed for moderate constipation. 07/04/20   Karamalegos, Devonne Doughty, DO  rOPINIRole (REQUIP) 2 MG tablet Take 2 tablets (4 mg total) by mouth at bedtime. 07/04/20   Karamalegos, Devonne Doughty, DO  Wheat Dextrin (BENEFIBER) POWD Take 6 g by mouth 3 (three) times daily before meals. (2 tsp = 6 g) 08/01/19 07/31/20  Milinda Pointer, MD    Allergies    Flexeril [cyclobenzaprine hcl] and Vancomycin  Review of Systems   Review of Systems Ten  systems are reviewed and are negative for acute change except as noted in the HPI  Physical Exam Updated Vital Signs BP 126/70   Pulse 61   Temp 98.2 F (36.8 C) (Oral)   Resp 18   Ht 5\' 7"  (1.702 m)   Wt 68.8 kg   SpO2 95%   BMI 23.76 kg/m   Physical Exam Constitutional:      General: She is not in acute distress.    Appearance: Normal appearance. She is well-developed. She is not ill-appearing or diaphoretic.  HENT:     Head: Normocephalic and atraumatic.  Eyes:     General: Vision grossly intact. Gaze aligned appropriately.     Pupils: Pupils are equal, round, and reactive to light.  Neck:     Trachea: Trachea and phonation normal.  Pulmonary:     Effort: Pulmonary effort is normal. No respiratory distress.  Abdominal:     General: There is no distension.     Palpations: Abdomen is soft.     Tenderness: There is no abdominal tenderness. There is no guarding or rebound.  Musculoskeletal:        General: Normal range of motion.     Cervical back: Normal range of motion.  Skin:    General: Skin is warm and dry.  Neurological:     Mental Status: She is alert.     GCS: GCS eye subscore is 4. GCS verbal subscore is 5. GCS motor subscore is 6.     Comments: Speech is clear and goal oriented, follows commands Major Cranial nerves without deficit, no facial droop Moves extremities without ataxia, coordination intact  Psychiatric:        Behavior: Behavior normal.     ED Results / Procedures / Treatments   Labs (all labs ordered are listed, but only abnormal results are displayed) Labs Reviewed  BASIC METABOLIC PANEL - Abnormal; Notable for the following components:      Result Value   CO2 17 (*)    BUN 58 (*)  Creatinine, Ser 2.09 (*)    GFR, Estimated 22 (*)    All other components within normal limits  URINALYSIS, ROUTINE W REFLEX MICROSCOPIC - Abnormal; Notable for the following components:   Hgb urine dipstick SMALL (*)    Leukocytes,Ua TRACE (*)    All  other components within normal limits  CBC WITH DIFFERENTIAL/PLATELET  BASIC METABOLIC PANEL    EKG None  Radiology DG Chest 2 View  Result Date: 08/11/2020 CLINICAL DATA:  Drowsiness, recent pneumonia, previous tobacco abuse EXAM: CHEST - 2 VIEW COMPARISON:  08/03/2020, 05/25/2020 FINDINGS: Frontal and lateral views of the chest demonstrates stable dual lead pacemaker. The cardiac silhouette is enlarged. Diffuse interstitial scarring and fibrosis is unchanged. Chronic atelectasis or scarring at the right lung base. No acute airspace disease, effusion, or pneumothorax. IMPRESSION: 1. Chronic scarring.  No acute process. Electronically Signed   By: Randa Ngo M.D.   On: 08/11/2020 16:34    Procedures Procedures (including critical care time)  Medications Ordered in ED Medications  sodium chloride 0.9 % bolus 500 mL (0 mLs Intravenous Stopped 08/11/20 1813)    ED Course  I have reviewed the triage vital signs and the nursing notes.  Pertinent labs & imaging results that were available during my care of the patient were reviewed by me and considered in my medical decision making (see chart for details).  Clinical Course as of 08/11/20 1845  Mon Aug 11, 2020  1509 Winfield Rast (Granddaughter)  [phone] [facility] Phone) [BM]    Clinical Course User Index [BM] Gari Crown   MDM Rules/Calculators/A&P                         Additional history obtained from: 1. Nursing notes from this visit. 2. Family. 3. Review of electronic medical records.  Reviewed discharge summary, patient admitted 08/02/2020 and discharged 3 days later diagnosis CAP right lower lobe.  She had been admitted started on Rocephin/azithromycin initially, transition to [facility].  Improved and discharged. ----------------- 86 year old female arrived for concern of drowsiness that began today.  After discussion with both patient and her granddaughter/caretaker over the phone it appears patient took  a double dose of her oxycodone 20 mg today.  Patient handles all of her own daily medications but granddaughter reports she will now start helping with medications.  On my exam patient is fully alert and oriented with no focal neuro deficits.  There is no history of neurodeficit suggestive of CVA/TIA.  Patient reports that she is feeling well no chest pain shortness of breath abdominal pain nausea vomiting or diarrhea.  Vital signs on arrival are within normal limits.  Will obtain screening labs including CBC, BMP, urinalysis chest x-ray and EKG - BMP shows AKI with creatinine 2.09, bicarb low at 17, no emergent electrolyte derangement or gap. Urinalysis without evidence of infection. CBC was canceled by lab due to not enough blood being in the tube.  Reordered.  IV fluids ordered for dehydration. Case discussed with attending physician Dr. Roderic Palau, plan of care is to give 500 mL fluid bolus and repeat BMP. - Patient reassessed.  She is resting comfortably in bed no acute distress.  Her son is at baseline and reports mental status is baseline.  On repeat exam she is fully alert and oriented follows commands well no focal neuro deficits.  Vital signs stable on room air.  She has no complaints or concerns.  She is receiving fluid bolus and phlebotomy is  coming to redraw labs. - CBC and repeat BMP pending.  Care handoff given to Dr. Roderic Palau at shift change.  Final disposition per oncoming team.  Note: Portions of this report may have been transcribed using voice recognition software. Every effort was made to ensure accuracy; however, inadvertent computerized transcription errors may still be present. Final Clinical Impression(s) / ED Diagnoses Final diagnoses:  None    Rx / DC Orders ED Discharge Orders    None       Gari Crown 08/11/20 1845    Margette Fast, MD 08/12/20 [phone]

## 2020-08-11 NOTE — Discharge Instructions (Addendum)
Follow-up with your doctor tomorrow as planned.  Do not take your evening dose of Lasix today.  And discussed when to start back up with your doctor tomorrow

## 2020-08-11 NOTE — ED Triage Notes (Signed)
Pt brought in by EMS due to being drowsy which has progressively gotten worse since she has been out of the hospital. Pt takes oxycodone for pain

## 2020-08-12 ENCOUNTER — Other Ambulatory Visit: Payer: Self-pay

## 2020-08-12 ENCOUNTER — Encounter: Payer: Self-pay | Admitting: Family Medicine

## 2020-08-12 ENCOUNTER — Ambulatory Visit (INDEPENDENT_AMBULATORY_CARE_PROVIDER_SITE_OTHER): Payer: Medicare HMO | Admitting: Family Medicine

## 2020-08-12 VITALS — BP 74/44 | HR 88 | Temp 96.2°F | Ht 66.0 in | Wt 147.0 lb

## 2020-08-12 DIAGNOSIS — I5032 Chronic diastolic (congestive) heart failure: Secondary | ICD-10-CM

## 2020-08-12 DIAGNOSIS — I872 Venous insufficiency (chronic) (peripheral): Secondary | ICD-10-CM | POA: Diagnosis not present

## 2020-08-12 DIAGNOSIS — R531 Weakness: Secondary | ICD-10-CM

## 2020-08-12 DIAGNOSIS — R600 Localized edema: Secondary | ICD-10-CM

## 2020-08-12 DIAGNOSIS — J189 Pneumonia, unspecified organism: Secondary | ICD-10-CM | POA: Diagnosis not present

## 2020-08-12 DIAGNOSIS — J432 Centrilobular emphysema: Secondary | ICD-10-CM | POA: Diagnosis not present

## 2020-08-12 DIAGNOSIS — J9601 Acute respiratory failure with hypoxia: Secondary | ICD-10-CM | POA: Diagnosis not present

## 2020-08-12 DIAGNOSIS — J45998 Other asthma: Secondary | ICD-10-CM | POA: Diagnosis not present

## 2020-08-12 NOTE — Assessment & Plan Note (Addendum)
Resolved CAP Repeat CXR 12/20 improved Still has supplemental oxygen requirement Completed IV and oral antibiotics from prior hospitalization History COVID 05/2020

## 2020-08-12 NOTE — Progress Notes (Signed)
Subjective:    [name], female    DOB: 02-16-1930, 86 y.o.   MRN: [id number]  Kristin [name] is a 86 y.o. female presenting on 08/12/2020 for Hospitalization Follow-up (Dehydration--as per hospital she might have taken extra med's and daughter wants to talk about her meds)  History provided by patient and granddaughter, [name].  HPI  HOSPITAL FOLLOW-UP VISIT  Hospital/Location: [facility] Date of Admission: 08/02/20 Date of Discharge: 08/05/20 Transitions of care telephone call: Completed by Kellie Simmering LPN on 88/50/27  Reason for Admission ./ Diagnoses: Community Acquired Pneumonia with Acute Respiratory Failure hypoxia  - Hospital H&P and Discharge Summary have been reviewed - Patient presents today 7 days after recent hospitalization. Brief summary of recent course, patient had symptoms of cough and pneumonia, hospitalized, treated with IV antibiotics and transition to oral antibiotics R sided pneumonia with improvement.  Additional update - ED visit 08/11/20 [facility] - due to altered mental status and sedation, review of ED report shows patient took double dose of oxycodone 20mg  and patient does handle her own medications, but granddaughter is going to help. Reported today that yesterday morning on the way to eye doctor appointment, patient become completely incoherent, they did not do eye apt, and called 911, they had low oxygen and low heart rate and pulse.  Hospital also advised that she may have taken other medication extra. They admit that she may have taken additional lisinopril. However it is no longer on her med list. It should have been discontinued.  - Today reports overall has done well after discharge. Symptoms of cognitive decline has resolved. Near baseline now.  - New medications on discharge: None (completed oral antibiotic) - Changes to current meds on discharge: Discontinue Lisinopril  I have reviewed the discharge medication list, and have reconciled  the current and discharge medications today.   Current Outpatient Medications:  .  acetaminophen (TYLENOL) 500 MG tablet, Take 500 mg by mouth as needed., Disp: , Rfl:  .  albuterol (PROVENTIL) (2.5 MG/3ML) 0.083% nebulizer solution, Take 3 mLs (2.5 mg total) by nebulization every 8 (eight) hours as needed for wheezing or shortness of breath., Disp: 75 mL, Rfl: 3 .  apixaban (ELIQUIS) 5 MG TABS tablet, Take 1 tablet (5 mg total) by mouth 2 (two) times daily. (Patient taking differently: Take 5 mg by mouth 2 (two) times daily. For stroke prevention), Disp: 60 tablet, Rfl: 0 .  bisacodyl (DULCOLAX) 5 MG EC tablet, Take 2 tablets (10 mg total) by mouth daily as needed for moderate constipation., Disp: 180 tablet, Rfl: 1 .  Cholecalciferol (VITAMIN D) 2000 units tablet, Take 2,000 Units by mouth daily. , Disp: , Rfl:  .  dextromethorphan-guaiFENesin (MUCINEX DM) 30-600 MG 12hr tablet, Take 1 tablet by mouth 2 (two) times daily., Disp: 30 tablet, Rfl: 0 .  diclofenac Sodium (VOLTAREN) 1 % GEL, Apply topically 4 (four) times daily., Disp: , Rfl:  .  levocetirizine (XYZAL) 5 MG tablet, Take 0.5 tablets (2.5 mg total) by mouth every other day., Disp: 23 tablet, Rfl: 4 .  Melatonin 10 MG CAPS, Take 10 mg by mouth at bedtime., Disp: 30 capsule, Rfl: 2 .  mupirocin ointment (BACTROBAN) 2 %, Apply 1 application topically 2 (two) times daily as needed. For up to 7-10 days as needed to help heal and prevent infection., Disp: 22 g, Rfl: 2 .  Oxycodone HCl 20 MG TABS, Take 1 tablet (20 mg total) by mouth every 8 (eight) hours as  needed. Must last 30 days, Disp: 90 tablet, Rfl: 0 .  OXYGEN, Inhale 2 L into the lungs at bedtime. , Disp: , Rfl:  .  polyethylene glycol (MIRALAX / GLYCOLAX) 17 g packet, Take 17 g by mouth daily as needed for moderate constipation., Disp: 90 packet, Rfl: 1 .  rOPINIRole (REQUIP) 2 MG tablet, Take 2 tablets (4 mg total) by mouth at bedtime., Disp: 180 tablet, Rfl: 1 .  furosemide (LASIX)  40 MG tablet, Take 0.5-1 tablets (20-40 mg total) by mouth daily as needed for fluid or edema., Disp: 30 tablet, Rfl: 2 .  gabapentin (NEURONTIN) 100 MG capsule, TAKE 1 CAPSULE BY MOUTH ONCE DAILY THEN INCREASE EVERY 2 TO 3 DAYS UP TO TAKING THREE TIMES DAILY OR 3 CAPSULES AT BEDTIME (Patient taking differently: Take 300 mg by mouth at bedtime.), Disp: 90 capsule, Rfl: 2 .  Oxycodone HCl 20 MG TABS, Take 1 tablet (20 mg total) by mouth every 8 (eight) hours as needed. Must last 30 days, Disp: 90 tablet, Rfl: 0 .  [START ON 08/25/2020] Oxycodone HCl 20 MG TABS, Take 1 tablet (20 mg total) by mouth every 8 (eight) hours as needed. Must last 30 days, Disp: 90 tablet, Rfl: 0 .  Wheat Dextrin (BENEFIBER) POWD, Take 6 g by mouth 3 (three) times daily before meals. (2 tsp = 6 g), Disp: 730 g, Rfl: 8  ------------------------------------------------------------------------- Social History   Tobacco Use  . Smoking status: Former Smoker    Packs/day: 1.25    Years: 40.00    Pack years: 50.00    Types: Cigarettes    Quit date: 12/16/1998    Years since quitting: 21.6  . Smokeless tobacco: Former Network engineer  . Vaping Use: Never used  Substance Use Topics  . Alcohol use: No    Alcohol/week: 0.0 standard drinks  . Drug use: Yes    Types: Oxycodone    Review of Systems Per HPI unless specifically indicated above     Objective:    BP (!) 74/44   Pulse 88   Temp (!) 96.2 F (35.7 C) (Temporal)   Ht 5\' 6"  (1.676 m)   Wt 147 lb (66.7 kg)   SpO2 98%   BMI 23.73 kg/m   Wt Readings from Last 3 Encounters:  08/12/20 147 lb (66.7 kg)  08/11/20 151 lb 10.8 oz (68.8 kg)  08/04/20 151 lb 10.8 oz (68.8 kg)    Physical Exam Vitals and nursing note reviewed.  Constitutional:      General: She is not in acute distress.    Appearance: She is well-developed and well-nourished. She is not diaphoretic.     Comments: Currently well 86 year old elderly female, comfortable, cooperative  HENT:      Head: Normocephalic and atraumatic.     Mouth/Throat:     Mouth: Oropharynx is clear and moist.  Eyes:     General:        Right eye: No discharge.        Left eye: No discharge.     Conjunctiva/sclera: Conjunctivae normal.  Neck:     Thyroid: No thyromegaly.  Cardiovascular:     Rate and Rhythm: Normal rate and regular rhythm.     Pulses: Intact distal pulses.     Heart sounds: Normal heart sounds. No murmur heard.   Pulmonary:     Effort: Pulmonary effort is normal. No respiratory distress.     Breath sounds: Normal breath sounds. No wheezing or  rales.     Comments: On supplemental oxygen portable then switched to tank Musculoskeletal:        General: Normal range of motion.     Cervical back: Normal range of motion and neck supple.     Right lower leg: Edema (+1 stable with compression on ) present.     Left lower leg: Edema (+1 stable with compression on) present.  Lymphadenopathy:     Cervical: No cervical adenopathy.  Skin:    General: Skin is warm and dry.     Findings: No erythema or rash.  Neurological:     Mental Status: She is alert and oriented to person, place, and time.  Psychiatric:        Mood and Affect: Mood and affect normal.        Behavior: Behavior normal.     Comments: Well groomed, good eye contact, normal speech and thoughts. However not as active in conversation today.      I have personally reviewed the radiology report from 08/11/20 on CXR.  DG Chest 2 ViewPerformed 08/11/2020 Final result  Study Result CLINICAL DATA: Drowsiness, recent pneumonia, previous tobacco abuse  EXAM: CHEST - 2 VIEW  COMPARISON: 08/03/2020, 05/25/2020  FINDINGS: Frontal and lateral views of the chest demonstrates stable dual lead pacemaker. The cardiac silhouette is enlarged. Diffuse interstitial scarring and fibrosis is unchanged. Chronic atelectasis or scarring at the right lung base. No acute airspace disease, effusion, or pneumothorax.  IMPRESSION: 1.  Chronic scarring. No acute process.   Electronically Signed By: Randa Ngo M.D. On: 08/11/2020 16:34     Results for orders placed or performed during the hospital encounter of 32/20/25  Basic metabolic panel  Result Value Ref Range   Sodium 138 135 - 145 mmol/L   Potassium 4.9 3.5 - 5.1 mmol/L   Chloride 110 98 - 111 mmol/L   CO2 17 (L) 22 - 32 mmol/L   Glucose, Bld 72 70 - 99 mg/dL   BUN 58 (H) 8 - 23 mg/dL   Creatinine, Ser 2.09 (H) 0.44 - 1.00 mg/dL   Calcium 9.4 8.9 - 10.3 mg/dL   GFR, Estimated 22 (L) >60 mL/min   Anion gap 11 5 - 15  Urinalysis, Routine w reflex microscopic Urine, Clean Catch  Result Value Ref Range   Color, Urine YELLOW YELLOW   APPearance CLEAR CLEAR   Specific Gravity, Urine 1.014 1.005 - 1.030   pH 5.0 5.0 - 8.0   Glucose, UA NEGATIVE NEGATIVE mg/dL   Hgb urine dipstick SMALL (A) NEGATIVE   Bilirubin Urine NEGATIVE NEGATIVE   Ketones, ur NEGATIVE NEGATIVE mg/dL   Protein, ur NEGATIVE NEGATIVE mg/dL   Nitrite NEGATIVE NEGATIVE   Leukocytes,Ua TRACE (A) NEGATIVE   RBC / HPF 0-5 0 - 5 RBC/hpf   WBC, UA 0-5 0 - 5 WBC/hpf   Bacteria, UA NONE SEEN NONE SEEN   Squamous Epithelial / LPF 0-5 0 - 5   Hyaline Casts, UA PRESENT   CBC with Differential  Result Value Ref Range   WBC 5.0 4.0 - 10.5 K/uL   RBC 3.97 3.87 - 5.11 MIL/uL   Hemoglobin 11.9 (L) 12.0 - 15.0 g/dL   HCT 40.2 36.0 - 46.0 %   MCV 101.3 (H) 80.0 - 100.0 fL   MCH 30.0 26.0 - 34.0 pg   MCHC 29.6 (L) 30.0 - 36.0 g/dL   RDW 14.0 11.5 - 15.5 %   Platelets 141 (L) 150 - 400 K/uL   nRBC  0.0 0.0 - 0.2 %   Neutrophils Relative % 33 %   Neutro Abs 1.7 1.7 - 7.7 K/uL   Lymphocytes Relative 46 %   Lymphs Abs 2.3 0.7 - 4.0 K/uL   Monocytes Relative 9 %   Monocytes Absolute 0.5 0.1 - 1.0 K/uL   Eosinophils Relative 11 %   Eosinophils Absolute 0.5 0.0 - 0.5 K/uL   Basophils Relative 1 %   Basophils Absolute 0.1 0.0 - 0.1 K/uL   Immature Granulocytes 0 %   Abs Immature Granulocytes  0.01 0.00 - 0.07 K/uL  Basic metabolic panel  Result Value Ref Range   Sodium 138 135 - 145 mmol/L   Potassium 5.2 (H) 3.5 - 5.1 mmol/L   Chloride 109 98 - 111 mmol/L   CO2 18 (L) 22 - 32 mmol/L   Glucose, Bld 84 70 - 99 mg/dL   BUN 52 (H) 8 - 23 mg/dL   Creatinine, Ser 1.80 (H) 0.44 - 1.00 mg/dL   Calcium 9.5 8.9 - 10.3 mg/dL   GFR, Estimated 27 (L) >60 mL/min   Anion gap 11 5 - 15      Assessment & Plan:   Problem List Items Addressed This Visit    Venous insufficiency of both lower extremities   Relevant Medications   furosemide (LASIX) 40 MG tablet   RESOLVED: Community acquired pneumonia - Primary    Resolved CAP Repeat CXR 12/20 improved Still has supplemental oxygen requirement Completed IV and oral antibiotics from prior hospitalization History COVID 05/2020      Chronic diastolic CHF (congestive heart failure) (HCC)   Relevant Medications   furosemide (LASIX) 40 MG tablet   Centrilobular emphysema (HCC)   Acute respiratory failure with hypoxia (HCC)    Other Visit Diagnoses    Bilateral lower extremity edema       Relevant Medications   furosemide (LASIX) 40 MG tablet   Generalized weakness          #CAP - RESOLVED Acute hypoxic respiratory failure, on supplemental O2 Congestive Heart Failure, Diastolic - without acute exacerbation LE Edema Generalized weakness  Note - it appears her portable oxygen concentrator may not be functioning properly, pulse ox reading while wearing this was 70-80%, however when replaced with physical oxygen tank in room at 2L her O2 went back up to 98% on pulse ox. They can call company for oxygen to get new portable oxygen concentrator replacement.  Clinically with significant improvement with regards to recent CAP now resolved after antibiotic courses, completed oral course. Recent hospitalization, improved fluid balance as well with prior CHF now balanced on furosemide had higher dose, however caution with inc dose may run risk  of dehydration.  Caution if low blood volume, BP medication can run risk of hypotension and cause similar issues as recently with hospital visit.  Advised remain OFF / Discontinue Lisinopril for now.  Caution with Oxycodone can cause sedation and confusion. Very important to take correct dose, now granddaughter helping with pill admin to avoid accidental dosing from patient.  Limit or hold Oxycodone and gabapentin if patient is sedated or confused.  Reduce or limit Lasix 40mg  down to 0 or 20mg  PRN if need if swelling is not significant and if BP is lower.  She will need a bedside commode DME equipment to assist right now given she has difficulty with weakness after hospitalization and has had episode of cognitive confusion and low BP. She is at high fall risk if going to bathroom.  Recent lab already done 12/20 in ED.  Follow-up as scheduled 08/2020, re-schedule Feb 2022 visit.   No orders of the defined types were placed in this encounter.   Follow up plan: Return if symptoms worsen or fail to improve.   [name], [facility] 08/12/2020, 11:41 AM

## 2020-08-12 NOTE — Patient Instructions (Addendum)
Thank you for coming to the office today.  Caution with medications causing side effects - worry about low BP, low heart rate, and sedation / confusion  - Limit Oxycodone and Gabapentin if having these side effects - If BP is lower - should HOLD or reduce Furosemide by half - should take 0 or 20mg  if reduced swelling or low BP already. Especially if BP < 100 should HOLD.  DISCONTINUE Lisinopril.  Switched oxygen today, if the portable oxygen is not functioning, may need you to call the company and get a replacement.  [place] for [employer].   Please schedule a Follow-up Appointment to: Return if symptoms worsen or fail to improve.  If you have any other questions or concerns, please feel free to call the office or send a message through [facility]. You may also schedule an earlier appointment if necessary.  Additionally, you may be receiving a survey about your experience at our office within a few days to 1 week by e-mail or mail. We value your feedback.  [name], DO [facility]

## 2020-08-13 ENCOUNTER — Telehealth: Payer: Self-pay

## 2020-08-13 NOTE — Telephone Encounter (Signed)
Copied from [place] [id number]. Topic: Quick Communication - Home Health Verbal Orders >> Aug 12, 2020 11:46 AM Gillis Ends D wrote: Caller/Agency: Amy from [facility] Number: [phone] Requesting OT/PT/Skilled Nursing/Social Work/Speech Therapy: PT Frequency: once a week for 2 weeks and then 2 times a week for 2 weeks and then 1 time a week for 4 weeks.

## 2020-08-13 NOTE — Telephone Encounter (Signed)
Left detail message for verbal.

## 2020-08-14 ENCOUNTER — Telehealth: Payer: Self-pay | Admitting: Family Medicine

## 2020-08-14 DIAGNOSIS — I130 Hypertensive heart and chronic kidney disease with heart failure and stage 1 through stage 4 chronic kidney disease, or unspecified chronic kidney disease: Secondary | ICD-10-CM | POA: Diagnosis not present

## 2020-08-14 DIAGNOSIS — J189 Pneumonia, unspecified organism: Secondary | ICD-10-CM | POA: Diagnosis not present

## 2020-08-14 DIAGNOSIS — J432 Centrilobular emphysema: Secondary | ICD-10-CM | POA: Diagnosis not present

## 2020-08-14 DIAGNOSIS — M1030 Gout due to renal impairment, unspecified site: Secondary | ICD-10-CM | POA: Diagnosis not present

## 2020-08-14 DIAGNOSIS — I0981 Rheumatic heart failure: Secondary | ICD-10-CM | POA: Diagnosis not present

## 2020-08-14 DIAGNOSIS — I480 Paroxysmal atrial fibrillation: Secondary | ICD-10-CM | POA: Diagnosis not present

## 2020-08-14 DIAGNOSIS — I5033 Acute on chronic diastolic (congestive) heart failure: Secondary | ICD-10-CM | POA: Diagnosis not present

## 2020-08-14 DIAGNOSIS — J9601 Acute respiratory failure with hypoxia: Secondary | ICD-10-CM | POA: Diagnosis not present

## 2020-08-14 DIAGNOSIS — N189 Chronic kidney disease, unspecified: Secondary | ICD-10-CM | POA: Diagnosis not present

## 2020-08-14 NOTE — Telephone Encounter (Signed)
Called to request verbal orders for skilled nursing medical management and OT eval for energy conservation.  CB# [phone]

## 2020-08-18 NOTE — Telephone Encounter (Signed)
Left detail message for verbal.

## 2020-08-19 DIAGNOSIS — I130 Hypertensive heart and chronic kidney disease with heart failure and stage 1 through stage 4 chronic kidney disease, or unspecified chronic kidney disease: Secondary | ICD-10-CM | POA: Diagnosis not present

## 2020-08-19 DIAGNOSIS — R000 Tachycardia, unspecified: Secondary | ICD-10-CM | POA: Diagnosis not present

## 2020-08-19 DIAGNOSIS — M25562 Pain in left knee: Secondary | ICD-10-CM | POA: Diagnosis not present

## 2020-08-19 DIAGNOSIS — E1122 Type 2 diabetes mellitus with diabetic chronic kidney disease: Secondary | ICD-10-CM | POA: Diagnosis not present

## 2020-08-19 DIAGNOSIS — M65332 Trigger finger, left middle finger: Secondary | ICD-10-CM | POA: Diagnosis not present

## 2020-08-19 DIAGNOSIS — N183 Chronic kidney disease, stage 3 unspecified: Secondary | ICD-10-CM | POA: Diagnosis not present

## 2020-08-19 DIAGNOSIS — J90 Pleural effusion, not elsewhere classified: Secondary | ICD-10-CM | POA: Diagnosis not present

## 2020-08-19 DIAGNOSIS — R9431 Abnormal electrocardiogram [ECG] [EKG]: Secondary | ICD-10-CM | POA: Diagnosis not present

## 2020-08-19 DIAGNOSIS — I5032 Chronic diastolic (congestive) heart failure: Secondary | ICD-10-CM | POA: Diagnosis not present

## 2020-08-19 DIAGNOSIS — M25561 Pain in right knee: Secondary | ICD-10-CM | POA: Diagnosis not present

## 2020-08-22 ENCOUNTER — Other Ambulatory Visit: Payer: Self-pay | Admitting: Family Medicine

## 2020-08-22 DIAGNOSIS — R1031 Right lower quadrant pain: Secondary | ICD-10-CM

## 2020-08-26 DIAGNOSIS — I480 Paroxysmal atrial fibrillation: Secondary | ICD-10-CM | POA: Diagnosis not present

## 2020-08-26 DIAGNOSIS — J189 Pneumonia, unspecified organism: Secondary | ICD-10-CM | POA: Diagnosis not present

## 2020-08-26 DIAGNOSIS — N189 Chronic kidney disease, unspecified: Secondary | ICD-10-CM | POA: Diagnosis not present

## 2020-08-26 DIAGNOSIS — J9601 Acute respiratory failure with hypoxia: Secondary | ICD-10-CM | POA: Diagnosis not present

## 2020-08-26 DIAGNOSIS — J432 Centrilobular emphysema: Secondary | ICD-10-CM | POA: Diagnosis not present

## 2020-08-26 DIAGNOSIS — I5033 Acute on chronic diastolic (congestive) heart failure: Secondary | ICD-10-CM | POA: Diagnosis not present

## 2020-08-26 DIAGNOSIS — I130 Hypertensive heart and chronic kidney disease with heart failure and stage 1 through stage 4 chronic kidney disease, or unspecified chronic kidney disease: Secondary | ICD-10-CM | POA: Diagnosis not present

## 2020-08-26 DIAGNOSIS — I0981 Rheumatic heart failure: Secondary | ICD-10-CM | POA: Diagnosis not present

## 2020-08-26 DIAGNOSIS — M1030 Gout due to renal impairment, unspecified site: Secondary | ICD-10-CM | POA: Diagnosis not present

## 2020-08-27 ENCOUNTER — Ambulatory Visit (INDEPENDENT_AMBULATORY_CARE_PROVIDER_SITE_OTHER): Payer: Medicare HMO | Admitting: Vascular Surgery

## 2020-08-28 DIAGNOSIS — J9601 Acute respiratory failure with hypoxia: Secondary | ICD-10-CM | POA: Diagnosis not present

## 2020-08-28 DIAGNOSIS — I5033 Acute on chronic diastolic (congestive) heart failure: Secondary | ICD-10-CM | POA: Diagnosis not present

## 2020-08-28 DIAGNOSIS — J432 Centrilobular emphysema: Secondary | ICD-10-CM | POA: Diagnosis not present

## 2020-08-28 DIAGNOSIS — I130 Hypertensive heart and chronic kidney disease with heart failure and stage 1 through stage 4 chronic kidney disease, or unspecified chronic kidney disease: Secondary | ICD-10-CM | POA: Diagnosis not present

## 2020-08-28 DIAGNOSIS — I480 Paroxysmal atrial fibrillation: Secondary | ICD-10-CM | POA: Diagnosis not present

## 2020-08-28 DIAGNOSIS — M1030 Gout due to renal impairment, unspecified site: Secondary | ICD-10-CM | POA: Diagnosis not present

## 2020-08-28 DIAGNOSIS — I0981 Rheumatic heart failure: Secondary | ICD-10-CM | POA: Diagnosis not present

## 2020-08-28 DIAGNOSIS — J189 Pneumonia, unspecified organism: Secondary | ICD-10-CM | POA: Diagnosis not present

## 2020-08-28 DIAGNOSIS — N189 Chronic kidney disease, unspecified: Secondary | ICD-10-CM | POA: Diagnosis not present

## 2020-09-03 ENCOUNTER — Encounter (INDEPENDENT_AMBULATORY_CARE_PROVIDER_SITE_OTHER): Payer: Self-pay

## 2020-09-03 ENCOUNTER — Ambulatory Visit: Payer: Medicare HMO | Admitting: Family Medicine

## 2020-09-03 ENCOUNTER — Other Ambulatory Visit: Payer: Self-pay

## 2020-09-03 ENCOUNTER — Encounter (INDEPENDENT_AMBULATORY_CARE_PROVIDER_SITE_OTHER): Payer: Self-pay | Admitting: Vascular Surgery

## 2020-09-03 ENCOUNTER — Ambulatory Visit (INDEPENDENT_AMBULATORY_CARE_PROVIDER_SITE_OTHER): Payer: Medicare HMO | Admitting: Vascular Surgery

## 2020-09-03 VITALS — BP 137/68 | HR 82 | Resp 16 | Ht 67.0 in | Wt 150.0 lb

## 2020-09-03 DIAGNOSIS — I83223 Varicose veins of left lower extremity with both ulcer of ankle and inflammation: Secondary | ICD-10-CM

## 2020-09-03 DIAGNOSIS — I480 Paroxysmal atrial fibrillation: Secondary | ICD-10-CM | POA: Diagnosis not present

## 2020-09-03 DIAGNOSIS — L97321 Non-pressure chronic ulcer of left ankle limited to breakdown of skin: Secondary | ICD-10-CM

## 2020-09-03 DIAGNOSIS — I5033 Acute on chronic diastolic (congestive) heart failure: Secondary | ICD-10-CM | POA: Diagnosis not present

## 2020-09-03 DIAGNOSIS — N189 Chronic kidney disease, unspecified: Secondary | ICD-10-CM | POA: Diagnosis not present

## 2020-09-03 DIAGNOSIS — I130 Hypertensive heart and chronic kidney disease with heart failure and stage 1 through stage 4 chronic kidney disease, or unspecified chronic kidney disease: Secondary | ICD-10-CM | POA: Diagnosis not present

## 2020-09-03 DIAGNOSIS — J189 Pneumonia, unspecified organism: Secondary | ICD-10-CM | POA: Diagnosis not present

## 2020-09-03 DIAGNOSIS — J432 Centrilobular emphysema: Secondary | ICD-10-CM | POA: Diagnosis not present

## 2020-09-03 DIAGNOSIS — J9601 Acute respiratory failure with hypoxia: Secondary | ICD-10-CM | POA: Diagnosis not present

## 2020-09-03 DIAGNOSIS — M1030 Gout due to renal impairment, unspecified site: Secondary | ICD-10-CM | POA: Diagnosis not present

## 2020-09-03 DIAGNOSIS — I0981 Rheumatic heart failure: Secondary | ICD-10-CM | POA: Diagnosis not present

## 2020-09-03 NOTE — Progress Notes (Signed)
Patient presents today in follow-up to recent sclerotherapy to the left lower extremity in the setting of a bleeding varicose vein.  The area of hemorrhage is healing well.  There are no other surrounding varicosities (left ankle) to treat.  Patient is complaining of right lower extremity discomfort at night.  No recent ABI.  No sclerotherapy was performed today.  We will bring the patient back to undergo an ABI to assess for any contributing peripheral artery disease.  She is in agreement with the plan.

## 2020-09-10 DIAGNOSIS — I0981 Rheumatic heart failure: Secondary | ICD-10-CM | POA: Diagnosis not present

## 2020-09-10 DIAGNOSIS — I130 Hypertensive heart and chronic kidney disease with heart failure and stage 1 through stage 4 chronic kidney disease, or unspecified chronic kidney disease: Secondary | ICD-10-CM | POA: Diagnosis not present

## 2020-09-10 DIAGNOSIS — J9601 Acute respiratory failure with hypoxia: Secondary | ICD-10-CM | POA: Diagnosis not present

## 2020-09-10 DIAGNOSIS — N189 Chronic kidney disease, unspecified: Secondary | ICD-10-CM | POA: Diagnosis not present

## 2020-09-10 DIAGNOSIS — J432 Centrilobular emphysema: Secondary | ICD-10-CM | POA: Diagnosis not present

## 2020-09-10 DIAGNOSIS — I5033 Acute on chronic diastolic (congestive) heart failure: Secondary | ICD-10-CM | POA: Diagnosis not present

## 2020-09-10 DIAGNOSIS — J45998 Other asthma: Secondary | ICD-10-CM | POA: Diagnosis not present

## 2020-09-10 DIAGNOSIS — J189 Pneumonia, unspecified organism: Secondary | ICD-10-CM | POA: Diagnosis not present

## 2020-09-10 DIAGNOSIS — I480 Paroxysmal atrial fibrillation: Secondary | ICD-10-CM | POA: Diagnosis not present

## 2020-09-10 DIAGNOSIS — M1030 Gout due to renal impairment, unspecified site: Secondary | ICD-10-CM | POA: Diagnosis not present

## 2020-09-12 DIAGNOSIS — J45998 Other asthma: Secondary | ICD-10-CM | POA: Diagnosis not present

## 2020-09-15 NOTE — Progress Notes (Signed)
PROVIDER NOTE: Information contained herein reflects review and annotations entered in association with encounter. Interpretation of such information and data should be left to medically-trained personnel. Information provided to patient can be located elsewhere in the medical record under "Patient Instructions". Document created using STT-dictation technology, any transcriptional errors that may result from process are unintentional.    Patient: Kristin [name]  Service Category: E/M  Provider: Oswaldo Done, MD  DOB: 1930-06-12  DOS: 09/17/2020  Specialty: Interventional Pain Management  MRN: [id number]  Setting: Ambulatory outpatient  PCP: Smitty Cords, DO  Type: Established Patient    Referring Provider: Saralyn Pilar *  Location: Office  Delivery: Face-to-face     HPI  Kristin [name], a 87 y.o. year old female, is here today because of her Chronic pain syndrome [G89.4]. Kristin [name] primary complain today is Back Pain (low) Last encounter: My last encounter with her was on 06/04/2020. 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); Epidural fibrosis; Lumbar foraminal stenosis (Severe Left L4-5); Chronic sacroiliac joint pain (Left); Osteoarthritis involving multiple joints; Chronic neck pain; Chronic hip pain (Bilateral); Chronic cervical radicular pain (Tertiary Area of Pain) (Bilateral) (L>R); Chronic lower extremity pain  (Secondary Area of Pain) (Bilateral) (L>R); DDD (degenerative disc disease), cervical; Numbness and tingling of upper extremity (Bilateral); Chronic shoulder pain, Radicular (Bilateral) (L>R); Cervical facet syndrome (Bilateral) (L>R); Cervical (3-4 mm) Anterolisthesis of C4 on C5; Cervical foraminal stenosis (Bilateral); Spondylosis without myelopathy or radiculopathy, lumbosacral region; Chronic shoulders pain (Bilateral) (L>R); Osteoarthritis of shoulder (Bilateral); Osteoarthritis of hip (Bilateral); Chronic shoulder pain after shoulder replacement (Bilateral) (L>R); Trigger finger, left middle finger; Cervicalgia; Greater trochanteric bursitis of hip (Left); Other specified dorsopathies, sacral and sacrococcygeal region; Chronic hip pain (Left); Osteoarthritis of hip (Left); and Abnormal MRI, lumbar spine (11/03/2019) on their pertinent problem list. Pain Assessment: Severity of Chronic pain is reported as a 5 /10. Location: Back Lower/is having some pain in the front of left leg. Onset: More than a month ago. Quality: Aching. Timing: Constant. Modifying factor(s): rest, meds. Vitals:  height is 5\' 6"  (1.676 m) and weight is 152 lb (68.9 kg). Her temperature is 97 F (36.1 C) (abnormal). Her blood pressure is 140/106 (abnormal) and her pulse is 62. Her respiration is 16 and oxygen saturation is 100%.   Reason for encounter: medication management.   The patient indicates doing well with the current medication regimen. No adverse reactions or side effects reported to the medications.  The patient does admit that she has noticed the medicine not to be working as well as it used to.  Today I have offered her to get started on a "Drug Holiday" and she has accepted.  We will go ahead and start tapering the opioids down until we can stop them for 2 weeks.  Because she currently takes 60 mg/day of oxycodone, he will take a little bit of time until we can have her completely off of the medication.  In addition,  the patient indicates that she is having some increased pain in the area of the  lower back.  Physical exam is compatible with bilateral lumbar facet and bilateral sacroiliac joint pain.  In view of this, I will be bringing her back for a palliative bilateral lumbar facet block and bilateral SI joint block under fluoroscopic guidance and IV sedation for the purpose of improving this pain and making it easier for her to undergo the "Drug Holiday".  The plan was shared with the patient who understood and accepted.  RTCB: 12/17/2020 Nonopioids transferred 06/04/2020: Requip, MiraLAX, Dulcolax, and melatonin  Pharmacotherapy Assessment   Analgesic: Oxycodone IR 20 mg, 1 tab PO q 8 hrs (60 mg/day of oxycodone) MME/day: 90 mg/day.   Monitoring: [place] PMP: PDMP reviewed during this encounter.       Pharmacotherapy: No side-effects or adverse reactions reported. Compliance: No problems identified. Effectiveness: Clinically acceptable.  Valerie Salts, RN  09/17/2020 10:47 AM  Signed Nursing Pain Medication Assessment:  Safety precautions to be maintained throughout the outpatient stay will include: orient to surroundings, keep bed in low position, maintain call bell within reach at all times, provide assistance with transfer out of bed and ambulation.  Medication Inspection Compliance: Kristin [name] did not comply with our request to bring her pills to be counted. She was reminded that bringing the medication bottles, even when empty, is a requirement.  Medication: None brought in. Pill/Patch Count: None available to be counted. Bottle Appearance: No container available. Did not bring bottle(s) to appointment. Filled Date: N/A Last Medication intake:  Yesterday  Reminded to bring to all appointments.    UDS:  Summary  Date Value Ref Range Status  02/19/2020 Note  Final    Comment:    ==================================================================== [employer] [facility]  (MW) ==================================================================== Test                             Result       Flag       Units  Drug Present and Declared for Prescription Verification   Oxycodone                      4108         EXPECTED   ng/mg creat   Oxymorphone                    1875         EXPECTED   ng/mg creat   Noroxycodone                   >7353        EXPECTED   ng/mg creat   Noroxymorphone                 835          EXPECTED   ng/mg creat    Sources of oxycodone are scheduled prescription medications.    Oxymorphone, noroxycodone, and noroxymorphone are expected    metabolites of oxycodone. Oxymorphone is also available as a    scheduled prescription medication.  ==================================================================== Test                      Result    Flag   Units      Ref Range   Creatinine              136              mg/dL      >=47 ====================================================================  Declared Medications:  The flagging and interpretation on this report are based on the  following declared medications.  Unexpected results may arise from  inaccuracies in the declared medications.   **Note: The testing scope of this panel includes these medications:   Oxycodone   **Note: The testing scope of this panel does not include the  following reported medications:   Acetaminophen (Tylenol)  Albuterol  Apixaban (Eliquis)  Bisacodyl  Calcium  Furosemide (Lasix)  Helium  Levocetirizine (Xyzal)  Lisinopril (Zestril)  Melatonin  Oxygen  Polyethylene Glycol (MiraLAX)  Ropinirole (Requip)  Supplement  Vitamin D ==================================================================== For clinical consultation, please call [phone]. ====================================================================      ROS  Constitutional: Denies any fever or chills Gastrointestinal: No reported hemesis, hematochezia, vomiting, or  acute GI distress Musculoskeletal: Denies any acute onset joint swelling, redness, loss of ROM, or weakness Neurological: No reported episodes of acute onset apraxia, aphasia, dysarthria, agnosia, amnesia, paralysis, loss of coordination, or loss of consciousness  Medication Review  Benefiber, Melatonin, Oxycodone HCl, Oxygen-Helium, Vitamin D, acetaminophen, albuterol, apixaban, bisacodyl, dextromethorphan-guaiFENesin, diclofenac Sodium, furosemide, gabapentin, levocetirizine, mupirocin ointment, oxyCODONE, polyethylene glycol, and rOPINIRole  History Review  Allergy: Kristin [name] is allergic to flexeril [cyclobenzaprine hcl] and vancomycin. Drug: Kristin [name]  reports current drug use. Drug: Oxycodone. Alcohol:  reports no history of alcohol use. Tobacco:  reports that she quit smoking about 21 years ago. Her smoking use included cigarettes. She has a 50.00 pack-year smoking history. She has quit using smokeless tobacco. Social: Ms. Bracken  reports that she quit smoking about 21 years ago. Her smoking use included cigarettes. She has a 50.00 pack-year smoking history. She has quit using smokeless tobacco. She reports current drug use. Drug: Oxycodone. She reports that she does not drink alcohol. Medical:  has a past medical history of Acute postoperative pain (02/09/2018), Arrhythmia, sinus node (04/03/2014), Arthritis, Arthritis, Atrial fibrillation (HCC), Back pain, Bradycardia, Breast cancer (HCC) (2004), Cancer (HCC) (2004), CHF (congestive heart failure) (HCC), Chronic back pain, CKD (chronic kidney disease), COPD (chronic obstructive pulmonary disease) (HCC), Cystocele, Decubitus ulcers, Dehydration, Gout, Hematuria, Hepatitis C, Hyperlipidemia, Hypertension, Hypomagnesemia (06/25/2015), OAB (overactive bladder), Overactive bladder, Restless leg, Shoulder pain, bilateral, Urinary frequency, UTI (lower urinary tract infection), and Vaginal atrophy. Surgical: Ms. Wotton  has a past surgical history that  includes Abdominal hysterectomy; Back surgery; Rotator cuff repair; Breast surgery; Cataract extraction w/PHACO (10/19/2011); Cataract extraction w/PHACO (11/02/2011); Carpal tunnel release; Joint replacement; Colon surgery; Pacemaker insertion (N/A, 12/24/2015); Cholecystectomy; Breast lumpectomy (Right, 2004); and Insert / replace / remove pacemaker. Family: family history includes Breast cancer in her paternal aunt; Breast cancer (age of onset: 58) in her sister; Cancer in her sister; Heart disease in her father and mother; Kidney disease in her brother; Stroke in her mother.  Laboratory Chemistry Profile   Renal Lab Results  Component Value Date   BUN 52 (H) 08/11/2020   CREATININE 1.80 (H) 08/11/2020   BCR 18 06/06/2020   GFRAA 28 (L) 06/06/2020   GFRNONAA 27 (L) 08/11/2020     Hepatic Lab Results  Component Value Date   AST 31 08/03/2020   ALT 22 08/03/2020   ALBUMIN 3.7 08/03/2020   ALKPHOS 68 08/03/2020     Electrolytes Lab Results  Component Value Date   NA 138 08/11/2020   K 5.2 (H) 08/11/2020   CL 109 08/11/2020   CALCIUM 9.5 08/11/2020   MG 2.0 08/05/2020   PHOS 3.0 08/05/2020     Bone Lab Results  Component  Value Date   VD25OH 31 04/01/2020     Inflammation (CRP: Acute Phase) (ESR: Chronic Phase) Lab Results  Component Value Date   CRP <0.5 09/24/2015   ESRSEDRATE 12 09/24/2015   LATICACIDVEN 1.8 08/02/2020       Note: Above Lab results reviewed.  Recent Imaging Review  DG Chest 2 View CLINICAL DATA:  Drowsiness, recent pneumonia, previous tobacco abuse  EXAM: CHEST - 2 VIEW  COMPARISON:  08/03/2020, 05/25/2020  FINDINGS: Frontal and lateral views of the chest demonstrates stable dual lead pacemaker. The cardiac silhouette is enlarged. Diffuse interstitial scarring and fibrosis is unchanged. Chronic atelectasis or scarring at the right lung base. No acute airspace disease, effusion, or pneumothorax.  IMPRESSION: 1. Chronic scarring.  No acute  process.  Electronically Signed   By: Sharlet Salina M.D.   On: 08/11/2020 16:34 Note: Reviewed        Physical Exam  General appearance: Well nourished, well developed, and well hydrated. In no apparent acute distress Mental status: Alert, oriented x 3 (person, place, & time)       Respiratory: No evidence of acute respiratory distress Eyes: PERLA Vitals: BP (!) 140/106 Comment: did not take blood pressure medication today.  Will take whe  Pulse 62   Temp (!) 97 F (36.1 C)   Resp 16   Ht 5\' 6"  (1.676 m)   Wt 152 lb (68.9 kg)   SpO2 100%   BMI 24.53 kg/m  BMI: Estimated body mass index is 24.53 kg/m as calculated from the following:   Height as of this encounter: 5\' 6"  (1.676 m).   Weight as of this encounter: 152 lb (68.9 kg). Ideal: Ideal body weight: 59.3 kg (130 lb 11.7 oz) Adjusted ideal body weight: 63.2 kg (139 lb 3.8 oz)  Assessment   Status Diagnosis  Controlled Controlled Controlled 1. Chronic pain syndrome   2. Chronic low back pain (Primary Area of Pain) (Bilateral) (L>R)   3. Chronic lower extremity pain (Secondary Area of Pain) (Bilateral) (L>R)   4. Pharmacologic therapy   5. Uncomplicated opioid dependence (HCC)   6. DDD (degenerative disc disease), lumbosacral   7. Lumbar facet syndrome (Bilateral) (L>R)   8. Lumbar facet hypertrophy (L1-2, L2-3, and L4-5)   9. Chronic sacroiliac joint pain (Left)   10. Chronic anticoagulation (Eliquis)      Updated Problems: No problems updated.  Plan of Care  Problem-specific:  No problem-specific Assessment & Plan notes found for this encounter.  Kristin [name] has a current medication list which includes the following long-term medication(s): albuterol, apixaban, bisacodyl, furosemide, gabapentin, levocetirizine, melatonin, [START ON 09/24/2020] oxycodone, [START ON 10/01/2020] oxycodone, [START ON 10/08/2020] oxycodone, [START ON 10/15/2020] oxycodone, [START ON 12/10/2020] oxycodone, [START ON 12/03/2020]  oxycodone, [START ON 11/26/2020] oxycodone, [START ON 11/19/2020] oxycodone, [START ON 11/12/2020] oxycodone, [START ON 11/05/2020] oxycodone, [START ON 10/29/2020] oxycodone, [START ON 09/24/2020] oxycodone hcl, [START ON 10/24/2020] oxycodone hcl, oxycodone hcl, polyethylene glycol, ropinirole, and benefiber.  Pharmacotherapy (Medications Ordered): Meds ordered this encounter  Medications  . Oxycodone HCl 10 MG TABS    Sig: Take 1 tablet (10 mg total) by mouth every 6 (six) hours as needed. Must last 30 days.    Dispense:  120 tablet    Refill:  0    Chronic Pain: STOP Act (Not applicable) Fill 1 day early if closed on refill date. Avoid benzodiazepines within 8 hours of opioids  . oxyCODONE (OXY IR/ROXICODONE) 5 MG immediate release tablet  Sig: Take 1 tablet (5 mg total) by mouth 4 (four) times daily for 7 days. Max: 4/day. Must last 7 days.    Dispense:  28 tablet    Refill:  0    Prescription is part of a downward taper. Fill prescriptions in the correct order. Failure to do so may trigger withdrawal syndrome. Fill date: 09/24/2020. To last until: 10/01/2020.  Marland Kitchen oxyCODONE (OXY IR/ROXICODONE) 5 MG immediate release tablet    Sig: Take 1 tablet (5 mg total) by mouth 3 (three) times daily for 7 days. Max: 3/day. Must last 7 days.    Dispense:  21 tablet    Refill:  0    Prescription is part of a downward taper. Fill prescriptions in the correct order. Failure to do so may trigger withdrawal syndrome. Fill date: 10/01/2020. To last until: 10/08/2020.  Marland Kitchen oxyCODONE (OXY IR/ROXICODONE) 5 MG immediate release tablet    Sig: Take 1 tablet (5 mg total) by mouth 2 (two) times daily for 7 days. Max: 2/day. Must last 7 days.    Dispense:  14 tablet    Refill:  0    Prescription is part of a downward taper. Fill prescriptions in the correct order. Failure to do so may trigger withdrawal syndrome. Fill date: 10/08/2020. To last until: 10/15/2020.  Marland Kitchen oxyCODONE (OXY IR/ROXICODONE) 5 MG immediate release tablet    Sig:  Take 1 tablet (5 mg total) by mouth daily for 7 days. Max: 1/day. Must last 7 days.    Dispense:  7 tablet    Refill:  0    Prescription is part of a downward taper. Fill prescriptions in the correct order. Failure to do so may trigger withdrawal syndrome. Fill date: 10/15/2020. To last until: 10/22/2020.  Marland Kitchen oxyCODONE (OXY IR/ROXICODONE) 5 MG immediate release tablet    Sig: Take 1 tablet (5 mg total) by mouth daily for 7 days. Max: 1/day. Must last 7 days.    Dispense:  7 tablet    Refill:  0    Prescription is part of a downward taper. Fill prescriptions in the correct order. Failure to do so may trigger withdrawal syndrome. Fill date: 12/10/2020. To last until: 12/17/2020.  Marland Kitchen oxyCODONE (OXY IR/ROXICODONE) 5 MG immediate release tablet    Sig: Take 1 tablet (5 mg total) by mouth 2 (two) times daily for 7 days. Max: 2/day. Must last 7 days.    Dispense:  14 tablet    Refill:  0    Prescription is part of a downward taper. Fill prescriptions in the correct order. Failure to do so may trigger withdrawal syndrome. Fill date: 12/03/2020. To last until: 12/10/2020.  Marland Kitchen oxyCODONE (OXY IR/ROXICODONE) 5 MG immediate release tablet    Sig: Take 1 tablet (5 mg total) by mouth 3 (three) times daily for 7 days. Max: 3/day. Must last 7 days.    Dispense:  21 tablet    Refill:  0    Prescription is part of a downward taper. Fill prescriptions in the correct order. Failure to do so may trigger withdrawal syndrome. Fill date: 11/26/2020. To last until: 12/03/2020.  Marland Kitchen oxyCODONE (OXY IR/ROXICODONE) 5 MG immediate release tablet    Sig: Take 1 tablet (5 mg total) by mouth 4 (four) times daily for 7 days. Max: 4/day. Must last 7 days.    Dispense:  28 tablet    Refill:  0    Prescription is part of a downward taper. Fill prescriptions in the correct order. Failure  to do so may trigger withdrawal syndrome. Fill date: 11/19/2020. To last until: 11/26/2020.  Marland Kitchen oxyCODONE (OXY IR/ROXICODONE) 5 MG immediate release tablet    Sig:  Take 1 tablet (5 mg total) by mouth 5 (five) times daily for 7 days. Max: 5/day. Must last 7 days.    Dispense:  35 tablet    Refill:  0    Prescription is part of a downward taper. Fill prescriptions in the correct order. Failure to do so may trigger withdrawal syndrome. Fill date: 11/12/2020. To last until: 11/19/2020.  Marland Kitchen oxyCODONE (OXY IR/ROXICODONE) 5 MG immediate release tablet    Sig: Take 1 tablet (5 mg total) by mouth 6 (six) times daily for 7 days. Max: 6/day. Must last 7 days.    Dispense:  42 tablet    Refill:  0    Prescription is part of a downward taper. Fill prescriptions in the correct order. Failure to do so may trigger withdrawal syndrome. Fill date: 11/05/2020. To last until: 11/12/2020.  Marland Kitchen oxyCODONE (OXY IR/ROXICODONE) 5 MG immediate release tablet    Sig: Take 2 tablets (10 mg total) by mouth 3 (three) times daily AND 1 tablet (5 mg total) daily with lunch. Do all this for 7 days. For the next 7 days take 2 tab with breakfast, dinner, and at bedtime. Take only 1 tab at noon. Max: 7/day. Must last 7 days..    Dispense:  49 tablet    Refill:  0    Prescription is part of a downward taper. Fill prescriptions in the correct order. Failure to do so may trigger withdrawal syndrome. Fill date: 10/29/2020. To last until: 11/05/2020.  Marland Kitchen Oxycodone HCl 10 MG TABS    Sig: Take 1 tablet (10 mg total) by mouth 4 (four) times daily for 5 days. Max: 4/day. Must last 5 days.    Dispense:  20 tablet    Refill:  0    Prescription is part of a downward taper. Fill prescriptions in the correct order. Failure to do so may trigger withdrawal syndrome. Fill date: 10/24/2020. To last until: 10/29/2020.   Orders:  Orders Placed This Encounter  Procedures  . SACROILIAC JOINT INJECTION    Standing Status:   Future    Standing Expiration Date:   10/18/2020    Scheduling Instructions:     Side: Bilateral     Sedation: Patient's choice.     Timeframe: ASAP    Order Specific Question:   Where will this  procedure be performed?    Answer:   [facility]  . LUMBAR FACET(MEDIAL BRANCH NERVE BLOCK) MBNB    Standing Status:   Future    Standing Expiration Date:   10/18/2020    Scheduling Instructions:     Procedure: Lumbar facet block (AKA.: Lumbosacral medial branch nerve block)     Side: Bilateral     Level: L3-4, L4-5, & L5-S1 Facets (L2, L3, L4, L5, & S1 Medial Branch Nerves)     Sedation: Patient's choice.     Timeframe: ASAA    Order Specific Question:   Where will this procedure be performed?    Answer:   [facility]  . Blood Thinner Instructions to Nursing    Always make sure patient has clearance from prescribing physician to stop blood thinners for interventional therapies. If the patient requires a Lovenox-bridge therapy, make sure arrangements are made to institute it with the assistance of the PCP.    Scheduling Instructions:  Have Kristin [name] stop the Eliquis (Apixaban) x 3 days prior to procedure or surgery.   Follow-up plan:   Return for Procedure (w/ sedation): (B) L-FCT Blk + (B) SI Blk, (Blood Thinner Protocol).      Interventional management options:  Considering:   NOTE: ELIQUIS ANTICOAGULATION (Stop: x3 days prior to procedure  Restart 6 hours after procedures) Diagnostic right SI joint block Possible bilateral SI joint RFA Diagnostic bilateral femoral nerve + obturator NB Possible bilateral femoral nerve + obturator nerve RFA Diagnostic right IA shoulder joint injection   Palliative PRN treatment(s):   Diagnostic left SI joint block #2  Palliative left CESI #3  Palliative left trochanteric bursa injection #2  Palliative right IA hip inj. #2  (100% on the right) Palliative left IA hip inj. #3   Palliative right lumbar facet block #3 Palliative left lumbar facet block #5 Palliative right lumbar facet RFA #2 (Last done on 09/19/18) Palliative left lumbar facet RFA #4 (Last done on 08/01/18) Palliative bilateral suprascapular nerve  block Palliative bilateral suprascapular nerve RFA #2(Last done 07/13/18)        Recent Visits No visits were found meeting these conditions. Showing recent visits within past 90 days and meeting all other requirements Today's Visits Date Type Provider Dept  09/17/20 Office Visit Delano Metz, MD [facility]  Showing today's visits and meeting all other requirements Future Appointments No visits were found meeting these conditions. Showing future appointments within next 90 days and meeting all other requirements  I discussed the assessment and treatment plan with the patient. The patient was provided an opportunity to ask questions and all were answered. The patient agreed with the plan and demonstrated an understanding of the instructions.  Patient advised to call back or seek an in-person evaluation if the symptoms or condition worsens.  Duration of encounter: 30 minutes.  Note by: Oswaldo Done, MD Date: 09/17/2020; Time: 12:19 PM

## 2020-09-17 ENCOUNTER — Other Ambulatory Visit: Payer: Self-pay

## 2020-09-17 ENCOUNTER — Ambulatory Visit: Payer: Medicare HMO | Attending: Pain Medicine | Admitting: Pain Medicine

## 2020-09-17 ENCOUNTER — Encounter: Payer: Self-pay | Admitting: Pain Medicine

## 2020-09-17 VITALS — BP 140/106 | HR 62 | Temp 97.0°F | Resp 16 | Ht 66.0 in | Wt 152.0 lb

## 2020-09-17 DIAGNOSIS — G894 Chronic pain syndrome: Secondary | ICD-10-CM

## 2020-09-17 DIAGNOSIS — M533 Sacrococcygeal disorders, not elsewhere classified: Secondary | ICD-10-CM | POA: Diagnosis not present

## 2020-09-17 DIAGNOSIS — M5442 Lumbago with sciatica, left side: Secondary | ICD-10-CM | POA: Diagnosis not present

## 2020-09-17 DIAGNOSIS — F1120 Opioid dependence, uncomplicated: Secondary | ICD-10-CM

## 2020-09-17 DIAGNOSIS — Z7901 Long term (current) use of anticoagulants: Secondary | ICD-10-CM

## 2020-09-17 DIAGNOSIS — G8929 Other chronic pain: Secondary | ICD-10-CM

## 2020-09-17 DIAGNOSIS — M47816 Spondylosis without myelopathy or radiculopathy, lumbar region: Secondary | ICD-10-CM

## 2020-09-17 DIAGNOSIS — M79604 Pain in right leg: Secondary | ICD-10-CM | POA: Diagnosis not present

## 2020-09-17 DIAGNOSIS — Z79899 Other long term (current) drug therapy: Secondary | ICD-10-CM

## 2020-09-17 DIAGNOSIS — M79605 Pain in left leg: Secondary | ICD-10-CM

## 2020-09-17 DIAGNOSIS — M5137 Other intervertebral disc degeneration, lumbosacral region: Secondary | ICD-10-CM

## 2020-09-17 DIAGNOSIS — M5441 Lumbago with sciatica, right side: Secondary | ICD-10-CM

## 2020-09-17 MED ORDER — OXYCODONE HCL 5 MG PO TABS: 5.0000 mg | ORAL_TABLET | Freq: Three times a day (TID) | ORAL | 0 refills | Status: DC

## 2020-09-17 MED ORDER — OXYCODONE HCL 10 MG PO TABS: 10.0000 mg | ORAL_TABLET | Freq: Four times a day (QID) | ORAL | 0 refills | Status: DC

## 2020-09-17 MED ORDER — OXYCODONE HCL 5 MG PO TABS: 5.0000 mg | ORAL_TABLET | Freq: Every day | ORAL | 0 refills | Status: DC

## 2020-09-17 MED ORDER — OXYCODONE HCL 5 MG PO TABS: 5.0000 mg | ORAL_TABLET | Freq: Two times a day (BID) | ORAL | 0 refills | Status: DC

## 2020-09-17 MED ORDER — OXYCODONE HCL 5 MG PO TABS: 5.0000 mg | ORAL_TABLET | Freq: Four times a day (QID) | ORAL | 0 refills | Status: DC

## 2020-09-17 MED ORDER — OXYCODONE HCL 5 MG PO TABS: ORAL_TABLET | ORAL | 0 refills | Status: DC

## 2020-09-17 MED ORDER — OXYCODONE HCL 10 MG PO TABS: 10.0000 mg | ORAL_TABLET | Freq: Four times a day (QID) | ORAL | 0 refills | Status: DC | PRN

## 2020-09-17 NOTE — Progress Notes (Signed)
Nursing Pain Medication Assessment:  Safety precautions to be maintained throughout the outpatient stay will include: orient to surroundings, keep bed in low position, maintain call bell within reach at all times, provide assistance with transfer out of bed and ambulation.  Medication Inspection Compliance: Ms. Crady did not comply with our request to bring her pills to be counted. She was reminded that bringing the medication bottles, even when empty, is a requirement.  Medication: None brought in. Pill/Patch Count: None available to be counted. Bottle Appearance: No container available. Did not bring bottle(s) to appointment. Filled Date: N/A Last Medication intake:  Yesterday  Reminded to bring to all appointments.

## 2020-09-17 NOTE — Patient Instructions (Addendum)
____Stop Eliquis for 3 days prior to procedure Take your Blood pressure pill the morning of procedure_with a small sip of water. Bring a driver  Follow instructions on  your pill bottles.  Your oxycodone 5 mg will decrease every 7 days.  Oxycodone 10 mg dose has changed to 1 every 6 hours.  If you have any questions call us or your pharmacy for instructions.  _______________________________________________________________________________________  Drug Holidays (Slow)  What is a "Drug Holiday"? Drug Holiday: is the name given to the period of time during which a patient stops taking a medication(s) for the purpose of eliminating tolerance to the drug.  Benefits . Improved effectiveness of opioids. . Decreased opioid dose needed to achieve benefits. . Improved pain with lesser dose.  What is tolerance? Tolerance: is the progressive decreased in effectiveness of a drug due to its repetitive use. With repetitive use, the body gets use to the medication and as a consequence, it loses its effectiveness. This is a common problem seen with opioid pain medications. As a result, a larger dose of the drug is needed to achieve the same effect that used to be obtained with a smaller dose.  How long should a "Drug Holiday" last? You should stay off of the pain medicine for at least 14 consecutive days. (2 weeks)  Should I stop the medicine "cold [place]"? No. You should always coordinate with your Pain Specialist so that he/she can provide you with the correct medication dose to make the transition as smoothly as possible.  How do I stop the medicine? Slowly. You will be instructed to decrease the daily amount of pills that you take by one (1) pill every seven (7) days. This is called a "slow downward taper" of your dose. For example: if you normally take four (4) pills per day, you will be asked to drop this dose to three (3) pills per day for seven (7) days, then to two (2) pills per day for seven (7)  days, then to one (1) per day for seven (7) days, and at the end of those last seven (7) days, this is when the "Drug Holiday" would start.   Will I have withdrawals? By doing a "slow downward taper" like this one, it is unlikely that you will experience any significant withdrawal symptoms. Typically, what triggers withdrawals is the sudden stop of a high dose opioid therapy. Withdrawals can usually be avoided by slowly decreasing the dose over a prolonged period of time. If you do not follow these instructions and decide to stop your medication abruptly, withdrawals may be possible.  What are withdrawals? Withdrawals: refers to the wide range of symptoms that occur after stopping or dramatically reducing opiate drugs after heavy and prolonged use. Withdrawal symptoms do not occur to patients that use low dose opioids, or those who take the medication sporadically. Contrary to benzodiazepine (example: Valium, Xanax, etc.) or alcohol withdrawals ("Delirium Tremens"), opioid withdrawals are not lethal. Withdrawals are the physical manifestation of the body getting rid of the excess receptors.  Expected Symptoms Early symptoms of withdrawal may include: . Agitation . Anxiety . Muscle aches . Increased tearing . Insomnia . Runny nose . Sweating . Yawning  Late symptoms of withdrawal may include: . Abdominal cramping . Diarrhea . Dilated pupils . Goose bumps . Nausea . Vomiting  Will I experience withdrawals? Due to the slow nature of the taper, it is very unlikely that you will experience any.  What is a slow taper? Taper: refers  to the gradual decrease in dose.  (Last update: 03/12/2020) ____________________________________________________________________________________________    ____________________________________________________________________________________________  Preparing for Procedure with Sedation  Procedure appointments are limited to planned procedures: . No  Prescription Refills. . No disability issues will be discussed. . No medication changes will be discussed.  Instructions: . Oral Intake: Do not eat or drink anything for at least 8 hours prior to your procedure. (Exception: Blood Pressure Medication. See below.) . Transportation: Unless otherwise stated by your physician, you may drive yourself after the procedure. . Blood Pressure Medicine: Do not forget to take your blood pressure medicine with a sip of water the morning of the procedure. If your Diastolic (lower reading)is above 100 mmHg, elective cases will be cancelled/rescheduled. . Blood thinners: These will need to be stopped for procedures. Notify our staff if you are taking any blood thinners. Depending on which one you take, there will be specific instructions on how and when to stop it. . Diabetics on insulin: Notify the staff so that you can be scheduled 1st case in the morning. If your diabetes requires high dose insulin, take only  of your normal insulin dose the morning of the procedure and notify the staff that you have done so. . Preventing infections: Shower with an antibacterial soap the morning of your procedure. . Build-up your immune system: Take 1000 mg of Vitamin C with every meal (3 times a day) the day prior to your procedure. Marland Kitchen Antibiotics: Inform the staff if you have a condition or reason that requires you to take antibiotics before dental procedures. . Pregnancy: If you are pregnant, call and cancel the procedure. . Sickness: If you have a cold, fever, or any active infections, call and cancel the procedure. . Arrival: You must be in the facility at least 30 minutes prior to your scheduled procedure. . Children: Do not bring children with you. . Dress appropriately: Bring dark clothing that you would not mind if they get stained. . Valuables: Do not bring any jewelry or valuables.  Reasons to call and reschedule or cancel your procedure: (Following these  recommendations will minimize the risk of a serious complication.) . Surgeries: Avoid having procedures within 2 weeks of any surgery. (Avoid for 2 weeks before or after any surgery). . Flu Shots: Avoid having procedures within 2 weeks of a flu shots or . (Avoid for 2 weeks before or after immunizations). . Barium: Avoid having a procedure within 7-10 days after having had a radiological study involving the use of radiological contrast. (Myelograms, Barium swallow or enema study). . Heart attacks: Avoid any elective procedures or surgeries for the initial 6 months after a "Myocardial Infarction" (Heart Attack). . Blood thinners: It is imperative that you stop these medications before procedures. Let us know if you if you take any blood thinner.  . Infection: Avoid procedures during or within two weeks of an infection (including chest colds or gastrointestinal problems). Symptoms associated with infections include: Localized redness, fever, chills, night sweats or profuse sweating, burning sensation when voiding, cough, congestion, stuffiness, runny nose, sore throat, diarrhea, nausea, vomiting, cold or Flu symptoms, recent or current infections. It is specially important if the infection is over the area that we intend to treat. Marland Kitchen Heart and lung problems: Symptoms that may suggest an active cardiopulmonary problem include: cough, chest pain, breathing difficulties or shortness of breath, dizziness, ankle swelling, uncontrolled high or unusually low blood pressure, and/or palpitations. If you are experiencing any of these symptoms, cancel your procedure and  contact your primary care physician for an evaluation.  Remember:  Regular Business hours are:  Monday to Thursday 8:00 AM to 4:00 PM  Provider's Schedule: Milinda Pointer, MD:  Procedure days: Tuesday and Thursday 7:30 AM to 4:00 PM  Gillis Santa, MD:  Procedure days: Monday and Wednesday 7:30 AM to 4:00  PM ____________________________________________________________________________________________

## 2020-09-18 DIAGNOSIS — I0981 Rheumatic heart failure: Secondary | ICD-10-CM | POA: Diagnosis not present

## 2020-09-18 DIAGNOSIS — J189 Pneumonia, unspecified organism: Secondary | ICD-10-CM | POA: Diagnosis not present

## 2020-09-18 DIAGNOSIS — I130 Hypertensive heart and chronic kidney disease with heart failure and stage 1 through stage 4 chronic kidney disease, or unspecified chronic kidney disease: Secondary | ICD-10-CM | POA: Diagnosis not present

## 2020-09-18 DIAGNOSIS — J432 Centrilobular emphysema: Secondary | ICD-10-CM | POA: Diagnosis not present

## 2020-09-18 DIAGNOSIS — I5033 Acute on chronic diastolic (congestive) heart failure: Secondary | ICD-10-CM | POA: Diagnosis not present

## 2020-09-18 DIAGNOSIS — N189 Chronic kidney disease, unspecified: Secondary | ICD-10-CM | POA: Diagnosis not present

## 2020-09-18 DIAGNOSIS — I480 Paroxysmal atrial fibrillation: Secondary | ICD-10-CM | POA: Diagnosis not present

## 2020-09-18 DIAGNOSIS — J9601 Acute respiratory failure with hypoxia: Secondary | ICD-10-CM | POA: Diagnosis not present

## 2020-09-18 DIAGNOSIS — M1030 Gout due to renal impairment, unspecified site: Secondary | ICD-10-CM | POA: Diagnosis not present

## 2020-09-22 ENCOUNTER — Other Ambulatory Visit (INDEPENDENT_AMBULATORY_CARE_PROVIDER_SITE_OTHER): Payer: Self-pay | Admitting: Vascular Surgery

## 2020-09-22 DIAGNOSIS — M79604 Pain in right leg: Secondary | ICD-10-CM

## 2020-09-23 DIAGNOSIS — I5033 Acute on chronic diastolic (congestive) heart failure: Secondary | ICD-10-CM | POA: Diagnosis not present

## 2020-09-23 DIAGNOSIS — N189 Chronic kidney disease, unspecified: Secondary | ICD-10-CM | POA: Diagnosis not present

## 2020-09-23 DIAGNOSIS — I0981 Rheumatic heart failure: Secondary | ICD-10-CM | POA: Diagnosis not present

## 2020-09-23 DIAGNOSIS — J9601 Acute respiratory failure with hypoxia: Secondary | ICD-10-CM | POA: Diagnosis not present

## 2020-09-23 DIAGNOSIS — M1030 Gout due to renal impairment, unspecified site: Secondary | ICD-10-CM | POA: Diagnosis not present

## 2020-09-23 DIAGNOSIS — J432 Centrilobular emphysema: Secondary | ICD-10-CM | POA: Diagnosis not present

## 2020-09-23 DIAGNOSIS — I480 Paroxysmal atrial fibrillation: Secondary | ICD-10-CM | POA: Diagnosis not present

## 2020-09-23 DIAGNOSIS — J189 Pneumonia, unspecified organism: Secondary | ICD-10-CM | POA: Diagnosis not present

## 2020-09-23 DIAGNOSIS — I130 Hypertensive heart and chronic kidney disease with heart failure and stage 1 through stage 4 chronic kidney disease, or unspecified chronic kidney disease: Secondary | ICD-10-CM | POA: Diagnosis not present

## 2020-09-24 ENCOUNTER — Ambulatory Visit (INDEPENDENT_AMBULATORY_CARE_PROVIDER_SITE_OTHER): Payer: Medicare HMO

## 2020-09-24 ENCOUNTER — Other Ambulatory Visit: Payer: Self-pay

## 2020-09-24 ENCOUNTER — Ambulatory Visit (INDEPENDENT_AMBULATORY_CARE_PROVIDER_SITE_OTHER): Payer: Medicare HMO | Admitting: Nurse Practitioner

## 2020-09-24 VITALS — BP 146/84 | HR 89 | Ht 67.0 in | Wt 151.0 lb

## 2020-09-24 DIAGNOSIS — L97321 Non-pressure chronic ulcer of left ankle limited to breakdown of skin: Secondary | ICD-10-CM | POA: Diagnosis not present

## 2020-09-24 DIAGNOSIS — I70243 Atherosclerosis of native arteries of left leg with ulceration of ankle: Secondary | ICD-10-CM

## 2020-09-24 DIAGNOSIS — M5030 Other cervical disc degeneration, unspecified cervical region: Secondary | ICD-10-CM | POA: Diagnosis not present

## 2020-09-24 DIAGNOSIS — I83223 Varicose veins of left lower extremity with both ulcer of ankle and inflammation: Secondary | ICD-10-CM

## 2020-09-24 DIAGNOSIS — M79604 Pain in right leg: Secondary | ICD-10-CM | POA: Diagnosis not present

## 2020-09-25 ENCOUNTER — Other Ambulatory Visit: Payer: Self-pay | Admitting: Family Medicine

## 2020-09-25 DIAGNOSIS — R600 Localized edema: Secondary | ICD-10-CM

## 2020-09-25 DIAGNOSIS — I5032 Chronic diastolic (congestive) heart failure: Secondary | ICD-10-CM

## 2020-09-25 DIAGNOSIS — I872 Venous insufficiency (chronic) (peripheral): Secondary | ICD-10-CM

## 2020-09-25 NOTE — Telephone Encounter (Signed)
Refills for 3 months sent in in December. Should not be due.

## 2020-09-29 ENCOUNTER — Encounter (INDEPENDENT_AMBULATORY_CARE_PROVIDER_SITE_OTHER): Payer: Self-pay | Admitting: Nurse Practitioner

## 2020-09-29 NOTE — H&P (View-Only) (Signed)
Subjective:    [name], female    DOB: September 13, 1929, 87 y.o.   MRN: [id number] Chief Complaint  Patient presents with  . Follow-up    Pt Cont U/S     Patient presents today for evaluation of her left ankle ulceration. The patient had prominent varicosities around that area and she was treated with saline sclerotherapy. This ulceration had existed for several months and was very slow in healing. Initially it was felt that this may be related to venous insufficiency however following treatments of sclerotherapy as well as other conservative tactics such as use of medical grade 1 compression stockings, elevation and exercise the patient continues to have her ulceration. There is only slight improvement seen. Due to this ABIs were done. The patient is found to have an ABI 0.96 on the left and 0.82 on the left. She has a TBI 0.45 on the left with a TBI 0.60 on the right. The patient has monophasic tibial artery waveforms bilaterally with slightly dampened toe waveforms bilaterally.   Review of Systems  Cardiovascular: Positive for leg swelling.  Skin: Positive for wound.       Objective:   Physical Exam Vitals reviewed.  HENT:     Head: Normocephalic.  Cardiovascular:     Rate and Rhythm: Normal rate.     Pulses: Normal pulses.  Pulmonary:     Effort: Pulmonary effort is normal.  Feet:     Left foot:     Skin integrity: Ulcer present.  Skin:    General: Skin is warm and dry.  Neurological:     Mental Status: She is alert and oriented to person, place, and time.  Psychiatric:        Mood and Affect: Mood normal.        Behavior: Behavior normal.        Thought Content: Thought content normal.        Judgment: Judgment normal.     BP (!) 146/84   Pulse 89   Ht 5\' 7"  (1.702 m)   Wt 151 lb (68.5 kg)   BMI 23.65 kg/m   Past Medical History:  Diagnosis Date  . Acute postoperative pain 02/09/2018  . Arrhythmia, sinus node 04/03/2014  . Arthritis   . Arthritis    . Atrial fibrillation ([place])   . Back pain    lower back chronic  . Bradycardia   . Breast cancer ([place]) 2004   right breast cancer  . Cancer [facility]) 2004   rt breast cancer-post lumpectomy- chemo/rad  . CHF (congestive heart failure) ([place])   . Chronic back pain   . CKD (chronic kidney disease)   . COPD (chronic obstructive pulmonary disease) (HCC)    wears O2 at 2L via [place] at night  . Cystocele   . Decubitus ulcers   . Dehydration   . Gout   . Hematuria   . Hepatitis C   . Hyperlipidemia   . Hypertension   . Hypomagnesemia 06/25/2015  . OAB (overactive bladder)   . Overactive bladder   . Restless leg   . Shoulder pain, bilateral   . Urinary frequency   . UTI (lower urinary tract infection)   . Vaginal atrophy     Social History   Socioeconomic History  . Marital status: Single    Spouse name: Not on file  . Number of children: 2  . Years of education: 36  . Highest education level: 12th grade  Occupational History  .  Occupation: retired  Tobacco Use  . Smoking status: Former Smoker    Packs/day: 1.25    Years: 40.00    Pack years: 50.00    Types: Cigarettes    Quit date: 12/16/1998    Years since quitting: 21.8  . Smokeless tobacco: Former Network engineer  . Vaping Use: Never used  Substance and Sexual Activity  . Alcohol use: No    Alcohol/week: 0.0 standard drinks  . Drug use: Yes    Types: Oxycodone  . Sexual activity: Not Currently    Birth control/protection: Surgical  Other Topics Concern  . Not on file  Social History Narrative  . Not on file   Social Determinants of Health   Financial Resource Strain: Low Risk   . Difficulty of Paying Living Expenses: Not hard at all  Food Insecurity: No Food Insecurity  . Worried About Charity fundraiser in the Last Year: Never true  . Ran Out of Food in the Last Year: Never true  Transportation Needs: No Transportation Needs  . Lack of Transportation (Medical): No  . Lack of Transportation  (Non-Medical): No  Physical Activity: Inactive  . Days of Exercise per Week: 0 days  . Minutes of Exercise per Session: 0 min  Stress: No Stress Concern Present  . Feeling of Stress : Not at all  Social Connections: Not on file  Intimate Partner Violence: Not on file    Past Surgical History:  Procedure Laterality Date  . ABDOMINAL HYSTERECTOMY    . BACK SURGERY    . BREAST LUMPECTOMY Right 2004  . BREAST SURGERY     rt lumpectomy  . CARPAL TUNNEL RELEASE    . CATARACT EXTRACTION W/PHACO  10/19/2011   Procedure: CATARACT EXTRACTION PHACO AND INTRAOCULAR LENS PLACEMENT (IOC);  Surgeon: Elta Guadeloupe T. Gershon Crane, MD;  Location: [facility];  Service: Ophthalmology;  Laterality: Right;  CDE:10.81  . CATARACT EXTRACTION W/PHACO  11/02/2011   Procedure: CATARACT EXTRACTION PHACO AND INTRAOCULAR LENS PLACEMENT (IOC);  Surgeon: Elta Guadeloupe T. Gershon Crane, MD;  Location: [facility];  Service: Ophthalmology;  Laterality: Left;  CDE 8.60  . CHOLECYSTECTOMY     3/18  . COLON SURGERY    . INSERT / REPLACE / REMOVE PACEMAKER    . JOINT REPLACEMENT     bilateral TKA  . PACEMAKER INSERTION N/A 12/24/2015   Procedure: INSERTION PACEMAKER;  Surgeon: Isaias Cowman, MD;  Location: [facility] ORS;  Service: Cardiovascular;  Laterality: N/A;  . ROTATOR CUFF REPAIR     bilateral    Family History  Problem Relation Age of Onset  . Kidney disease Brother   . Heart disease Father   . Heart disease Mother   . Stroke Mother   . Cancer Sister   . Breast cancer Sister 9  . Breast cancer Paternal Aunt   . Anesthesia problems Neg Hx   . Hypotension Neg Hx   . Malignant hyperthermia Neg Hx   . Pseudochol deficiency Neg Hx     Allergies  Allergen Reactions  . Flexeril [Cyclobenzaprine Hcl]     Severe Rash  . Vancomycin Rash    [place] Syndrome    CBC Latest Ref Rng & Units 08/11/2020 08/05/2020 08/03/2020  WBC 4.0 - 10.5 K/uL 5.0 9.1 15.2(H)  Hemoglobin 12.0 - 15.0 g/dL 11.9(L) 11.9(L) 13.7  Hematocrit 36.0 - 46.0 % 40.2  38.8 46.0  Platelets 150 - 400 K/uL 141(L) 141(L) 131(L)      CMP     Component  Value Date/Time   NA 138 08/11/2020 1929   NA 138 09/06/2014 2215   K 5.2 (H) 08/11/2020 1929   K 4.4 09/06/2014 2215   CL 109 08/11/2020 1929   CL 106 09/06/2014 2215   CO2 18 (L) 08/11/2020 1929   CO2 23 09/06/2014 2215   GLUCOSE 84 08/11/2020 1929   GLUCOSE 92 09/06/2014 2215   BUN 52 (H) 08/11/2020 1929   BUN 25 (H) 09/06/2014 2215   CREATININE 1.80 (H) 08/11/2020 1929   CREATININE 1.86 (H) 06/06/2020 1120   CALCIUM 9.5 08/11/2020 1929   CALCIUM 9.0 09/06/2014 2215   PROT 7.4 08/03/2020 0637   PROT 7.1 07/10/2014 1219   ALBUMIN 3.7 08/03/2020 0637   ALBUMIN 3.2 (L) 07/10/2014 1219   AST 31 08/03/2020 0637   AST 26 07/10/2014 1219   ALT 22 08/03/2020 0637   ALT 15 07/10/2014 1219   ALKPHOS 68 08/03/2020 0637   ALKPHOS 69 07/10/2014 1219   BILITOT 1.6 (H) 08/03/2020 0637   BILITOT 0.4 07/10/2014 1219   GFRNONAA 27 (L) 08/11/2020 1929   GFRNONAA 24 (L) 06/06/2020 1120   GFRAA 28 (L) 06/06/2020 1120     No results found.     Assessment & Plan:   1. 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  This represents a limb threatening ischemia and places the patient at the risk for limb loss.  Patient should undergo angiography of the lower extremities with the hope for intervention for limb salvage.  The risks and benefits as well as the alternative therapies was discussed in detail with the patient.  All questions were answered.  Patient agrees to proceed with angiography.  The patient will follow up with me in the office after the procedure.    2. Varicose veins of left lower extremity with inflammation, with ulcer of ankle limited to breakdown of skin [facility]) Patient will continue with conservative  therapy of varicose veins including use of medical grade 1 compression stockings, elevation and exercise.  3. DDD (degenerative disc disease), cervical Continue NSAID medications as already ordered, these medications have been reviewed and there are no changes at this time.  Continued activity and therapy was stressed.    Current Outpatient Medications on File Prior to Visit  Medication Sig Dispense Refill  . acetaminophen (TYLENOL) 500 MG tablet Take 500 mg by mouth as needed.    Marland Kitchen albuterol (PROVENTIL) (2.5 MG/3ML) 0.083% nebulizer solution Take 3 mLs (2.5 mg total) by nebulization every 8 (eight) hours as needed for wheezing or shortness of breath. 75 mL 3  . apixaban (ELIQUIS) 5 MG TABS tablet Take 1 tablet (5 mg total) by mouth 2 (two) times daily. (Patient taking differently: Take 5 mg by mouth 2 (two) times daily. For stroke prevention) 60 tablet 0  . bisacodyl (DULCOLAX) 5 MG EC tablet Take 2 tablets (10 mg total) by mouth daily as needed for moderate constipation. 180 tablet 1  . Cholecalciferol (VITAMIN D) 2000 units tablet Take 2,000 Units by mouth daily.     Marland Kitchen dextromethorphan-guaiFENesin (MUCINEX DM) 30-600 MG 12hr tablet Take 1 tablet by mouth 2 (two) times daily. 30 tablet 0  . diclofenac Sodium (VOLTAREN) 1 % GEL Apply topically 4 (four) times daily.    . furosemide (LASIX) 40 MG tablet Take 0.5-1 tablets (20-40 mg total)  by mouth daily as needed for fluid or edema. 30 tablet 2  . gabapentin (NEURONTIN) 100 MG capsule TAKE 1 CAPSULE BY MOUTH ONCE DAILY THEN INCREASE EVERY 2 TO 3 DAYS UP TO TAKING THREE TIMES DAILY OR 3 CAPSULES AT BEDTIME 90 capsule 0  . levocetirizine (XYZAL) 5 MG tablet Take 0.5 tablets (2.5 mg total) by mouth every other day. 23 tablet 4  . Melatonin 10 MG CAPS Take 10 mg by mouth at bedtime. 30 capsule 2  . mupirocin ointment (BACTROBAN) 2 % Apply 1 application topically 2 (two) times daily as needed. For up to 7-10 days as needed to help heal and prevent  infection. 22 g 2  . oxyCODONE (OXY IR/ROXICODONE) 5 MG immediate release tablet Take 1 tablet (5 mg total) by mouth 4 (four) times daily for 7 days. Max: 4/day. Must last 7 days. 28 tablet 0  . [START ON 10/01/2020] oxyCODONE (OXY IR/ROXICODONE) 5 MG immediate release tablet Take 1 tablet (5 mg total) by mouth 3 (three) times daily for 7 days. Max: 3/day. Must last 7 days. 21 tablet 0  . [START ON 10/08/2020] oxyCODONE (OXY IR/ROXICODONE) 5 MG immediate release tablet Take 1 tablet (5 mg total) by mouth 2 (two) times daily for 7 days. Max: 2/day. Must last 7 days. 14 tablet 0  . [START ON 10/15/2020] oxyCODONE (OXY IR/ROXICODONE) 5 MG immediate release tablet Take 1 tablet (5 mg total) by mouth daily for 7 days. Max: 1/day. Must last 7 days. 7 tablet 0  . [START ON 12/10/2020] oxyCODONE (OXY IR/ROXICODONE) 5 MG immediate release tablet Take 1 tablet (5 mg total) by mouth daily for 7 days. Max: 1/day. Must last 7 days. 7 tablet 0  . [START ON 12/03/2020] oxyCODONE (OXY IR/ROXICODONE) 5 MG immediate release tablet Take 1 tablet (5 mg total) by mouth 2 (two) times daily for 7 days. Max: 2/day. Must last 7 days. 14 tablet 0  . [START ON 11/26/2020] oxyCODONE (OXY IR/ROXICODONE) 5 MG immediate release tablet Take 1 tablet (5 mg total) by mouth 3 (three) times daily for 7 days. Max: 3/day. Must last 7 days. 21 tablet 0  . [START ON 11/19/2020] oxyCODONE (OXY IR/ROXICODONE) 5 MG immediate release tablet Take 1 tablet (5 mg total) by mouth 4 (four) times daily for 7 days. Max: 4/day. Must last 7 days. 28 tablet 0  . [START ON 11/12/2020] oxyCODONE (OXY IR/ROXICODONE) 5 MG immediate release tablet Take 1 tablet (5 mg total) by mouth 5 (five) times daily for 7 days. Max: 5/day. Must last 7 days. 35 tablet 0  . [START ON 11/05/2020] oxyCODONE (OXY IR/ROXICODONE) 5 MG immediate release tablet Take 1 tablet (5 mg total) by mouth 6 (six) times daily for 7 days. Max: 6/day. Must last 7 days. 42 tablet 0  . [START ON 10/29/2020]  oxyCODONE (OXY IR/ROXICODONE) 5 MG immediate release tablet Take 2 tablets (10 mg total) by mouth 3 (three) times daily AND 1 tablet (5 mg total) daily with lunch. Do all this for 7 days. For the next 7 days take 2 tab with breakfast, dinner, and at bedtime. Take only 1 tab at noon. Max: 7/day. Must last 7 days.. 49 tablet 0  . Oxycodone HCl 10 MG TABS Take 1 tablet (10 mg total) by mouth every 6 (six) hours as needed. Must last 30 days. 120 tablet 0  . [START ON 10/24/2020] Oxycodone HCl 10 MG TABS Take 1 tablet (10 mg total) by mouth 4 (four) times daily for  5 days. Max: 4/day. Must last 5 days. 20 tablet 0  . Oxycodone HCl 20 MG TABS Take 1 tablet (20 mg total) by mouth every 8 (eight) hours as needed. Must last 30 days 90 tablet 0  . OXYGEN Inhale 2 L into the lungs at bedtime.     . polyethylene glycol (MIRALAX / GLYCOLAX) 17 g packet Take 17 g by mouth daily as needed for moderate constipation. 90 packet 1  . rOPINIRole (REQUIP) 2 MG tablet Take 2 tablets (4 mg total) by mouth at bedtime. 180 tablet 1  . Wheat Dextrin (BENEFIBER) POWD Take 6 g by mouth 3 (three) times daily before meals. (2 tsp = 6 g) 730 g 8   No current facility-administered medications on file prior to visit.    There are no Patient Instructions on file for this visit. No follow-ups on file.   Kris Hartmann, NP

## 2020-09-29 NOTE — Progress Notes (Signed)
Subjective:    [name], female    DOB: 21-Apr-1930, 87 y.o.   MRN: [id number] Chief Complaint  Patient presents with  . Follow-up    Pt Cont U/S     Patient presents today for evaluation of her left ankle ulceration. The patient had prominent varicosities around that area and she was treated with saline sclerotherapy. This ulceration had existed for several months and was very slow in healing. Initially it was felt that this may be related to venous insufficiency however following treatments of sclerotherapy as well as other conservative tactics such as use of medical grade 1 compression stockings, elevation and exercise the patient continues to have her ulceration. There is only slight improvement seen. Due to this ABIs were done. The patient is found to have an ABI 0.96 on the left and 0.82 on the left. She has a TBI 0.45 on the left with a TBI 0.60 on the right. The patient has monophasic tibial artery waveforms bilaterally with slightly dampened toe waveforms bilaterally.   Review of Systems  Cardiovascular: Positive for leg swelling.  Skin: Positive for wound.       Objective:   Physical Exam Vitals reviewed.  HENT:     Head: Normocephalic.  Cardiovascular:     Rate and Rhythm: Normal rate.     Pulses: Normal pulses.  Pulmonary:     Effort: Pulmonary effort is normal.  Feet:     Left foot:     Skin integrity: Ulcer present.  Skin:    General: Skin is warm and dry.  Neurological:     Mental Status: She is alert and oriented to person, place, and time.  Psychiatric:        Mood and Affect: Mood normal.        Behavior: Behavior normal.        Thought Content: Thought content normal.        Judgment: Judgment normal.     BP (!) 146/84   Pulse 89   Ht 5\' 7"  (1.702 m)   Wt 151 lb (68.5 kg)   BMI 23.65 kg/m   Past Medical History:  Diagnosis Date  . Acute postoperative pain 02/09/2018  . Arrhythmia, sinus node 04/03/2014  . Arthritis   . Arthritis    . Atrial fibrillation ([place])   . Back pain    lower back chronic  . Bradycardia   . Breast cancer ([place]) 2004   right breast cancer  . Cancer [facility]) 2004   rt breast cancer-post lumpectomy- chemo/rad  . CHF (congestive heart failure) ([place])   . Chronic back pain   . CKD (chronic kidney disease)   . COPD (chronic obstructive pulmonary disease) (HCC)    wears O2 at 2L via [place] at night  . Cystocele   . Decubitus ulcers   . Dehydration   . Gout   . Hematuria   . Hepatitis C   . Hyperlipidemia   . Hypertension   . Hypomagnesemia 06/25/2015  . OAB (overactive bladder)   . Overactive bladder   . Restless leg   . Shoulder pain, bilateral   . Urinary frequency   . UTI (lower urinary tract infection)   . Vaginal atrophy     Social History   Socioeconomic History  . Marital status: Single    Spouse name: Not on file  . Number of children: 2  . Years of education: 53  . Highest education level: 12th grade  Occupational History  .  Occupation: retired  Tobacco Use  . Smoking status: Former Smoker    Packs/day: 1.25    Years: 40.00    Pack years: 50.00    Types: Cigarettes    Quit date: 12/16/1998    Years since quitting: 21.8  . Smokeless tobacco: Former Network engineer  . Vaping Use: Never used  Substance and Sexual Activity  . Alcohol use: No    Alcohol/week: 0.0 standard drinks  . Drug use: Yes    Types: Oxycodone  . Sexual activity: Not Currently    Birth control/protection: Surgical  Other Topics Concern  . Not on file  Social History Narrative  . Not on file   Social Determinants of Health   Financial Resource Strain: Low Risk   . Difficulty of Paying Living Expenses: Not hard at all  Food Insecurity: No Food Insecurity  . Worried About Charity fundraiser in the Last Year: Never true  . Ran Out of Food in the Last Year: Never true  Transportation Needs: No Transportation Needs  . Lack of Transportation (Medical): No  . Lack of Transportation  (Non-Medical): No  Physical Activity: Inactive  . Days of Exercise per Week: 0 days  . Minutes of Exercise per Session: 0 min  Stress: No Stress Concern Present  . Feeling of Stress : Not at all  Social Connections: Not on file  Intimate Partner Violence: Not on file    Past Surgical History:  Procedure Laterality Date  . ABDOMINAL HYSTERECTOMY    . BACK SURGERY    . BREAST LUMPECTOMY Right 2004  . BREAST SURGERY     rt lumpectomy  . CARPAL TUNNEL RELEASE    . CATARACT EXTRACTION W/PHACO  10/19/2011   Procedure: CATARACT EXTRACTION PHACO AND INTRAOCULAR LENS PLACEMENT (IOC);  Surgeon: Elta Guadeloupe T. Gershon Crane, MD;  Location: [facility];  Service: Ophthalmology;  Laterality: Right;  CDE:10.81  . CATARACT EXTRACTION W/PHACO  11/02/2011   Procedure: CATARACT EXTRACTION PHACO AND INTRAOCULAR LENS PLACEMENT (IOC);  Surgeon: Elta Guadeloupe T. Gershon Crane, MD;  Location: [facility];  Service: Ophthalmology;  Laterality: Left;  CDE 8.60  . CHOLECYSTECTOMY     3/18  . COLON SURGERY    . INSERT / REPLACE / REMOVE PACEMAKER    . JOINT REPLACEMENT     bilateral TKA  . PACEMAKER INSERTION N/A 12/24/2015   Procedure: INSERTION PACEMAKER;  Surgeon: Isaias Cowman, MD;  Location: [facility];  Service: Cardiovascular;  Laterality: N/A;  . ROTATOR CUFF REPAIR     bilateral    Family History  Problem Relation Age of Onset  . Kidney disease Brother   . Heart disease Father   . Heart disease Mother   . Stroke Mother   . Cancer Sister   . Breast cancer Sister 71  . Breast cancer Paternal Aunt   . Anesthesia problems Neg Hx   . Hypotension Neg Hx   . Malignant hyperthermia Neg Hx   . Pseudochol deficiency Neg Hx     Allergies  Allergen Reactions  . Flexeril [Cyclobenzaprine Hcl]     Severe Rash  . Vancomycin Rash    [place] Syndrome    CBC Latest Ref Rng & Units 08/11/2020 08/05/2020 08/03/2020  WBC 4.0 - 10.5 K/uL 5.0 9.1 15.2(H)  Hemoglobin 12.0 - 15.0 g/dL 11.9(L) 11.9(L) 13.7  Hematocrit 36.0 - 46.0 % 40.2  38.8 46.0  Platelets 150 - 400 K/uL 141(L) 141(L) 131(L)      CMP     Component  Value Date/Time   NA 138 08/11/2020 1929   NA 138 09/06/2014 2215   K 5.2 (H) 08/11/2020 1929   K 4.4 09/06/2014 2215   CL 109 08/11/2020 1929   CL 106 09/06/2014 2215   CO2 18 (L) 08/11/2020 1929   CO2 23 09/06/2014 2215   GLUCOSE 84 08/11/2020 1929   GLUCOSE 92 09/06/2014 2215   BUN 52 (H) 08/11/2020 1929   BUN 25 (H) 09/06/2014 2215   CREATININE 1.80 (H) 08/11/2020 1929   CREATININE 1.86 (H) 06/06/2020 1120   CALCIUM 9.5 08/11/2020 1929   CALCIUM 9.0 09/06/2014 2215   PROT 7.4 08/03/2020 0637   PROT 7.1 07/10/2014 1219   ALBUMIN 3.7 08/03/2020 0637   ALBUMIN 3.2 (L) 07/10/2014 1219   AST 31 08/03/2020 0637   AST 26 07/10/2014 1219   ALT 22 08/03/2020 0637   ALT 15 07/10/2014 1219   ALKPHOS 68 08/03/2020 0637   ALKPHOS 69 07/10/2014 1219   BILITOT 1.6 (H) 08/03/2020 0637   BILITOT 0.4 07/10/2014 1219   GFRNONAA 27 (L) 08/11/2020 1929   GFRNONAA 24 (L) 06/06/2020 1120   GFRAA 28 (L) 06/06/2020 1120     No results found.     Assessment & Plan:   1. 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  This represents a limb threatening ischemia and places the patient at the risk for limb loss.  Patient should undergo angiography of the lower extremities with the hope for intervention for limb salvage.  The risks and benefits as well as the alternative therapies was discussed in detail with the patient.  All questions were answered.  Patient agrees to proceed with angiography.  The patient will follow up with me in the office after the procedure.    2. Varicose veins of left lower extremity with inflammation, with ulcer of ankle limited to breakdown of skin [facility]) Patient will continue with conservative  therapy of varicose veins including use of medical grade 1 compression stockings, elevation and exercise.  3. DDD (degenerative disc disease), cervical Continue NSAID medications as already ordered, these medications have been reviewed and there are no changes at this time.  Continued activity and therapy was stressed.    Current Outpatient Medications on File Prior to Visit  Medication Sig Dispense Refill  . acetaminophen (TYLENOL) 500 MG tablet Take 500 mg by mouth as needed.    Marland Kitchen albuterol (PROVENTIL) (2.5 MG/3ML) 0.083% nebulizer solution Take 3 mLs (2.5 mg total) by nebulization every 8 (eight) hours as needed for wheezing or shortness of breath. 75 mL 3  . apixaban (ELIQUIS) 5 MG TABS tablet Take 1 tablet (5 mg total) by mouth 2 (two) times daily. (Patient taking differently: Take 5 mg by mouth 2 (two) times daily. For stroke prevention) 60 tablet 0  . bisacodyl (DULCOLAX) 5 MG EC tablet Take 2 tablets (10 mg total) by mouth daily as needed for moderate constipation. 180 tablet 1  . Cholecalciferol (VITAMIN D) 2000 units tablet Take 2,000 Units by mouth daily.     Marland Kitchen dextromethorphan-guaiFENesin (MUCINEX DM) 30-600 MG 12hr tablet Take 1 tablet by mouth 2 (two) times daily. 30 tablet 0  . diclofenac Sodium (VOLTAREN) 1 % GEL Apply topically 4 (four) times daily.    . furosemide (LASIX) 40 MG tablet Take 0.5-1 tablets (20-40 mg total)  by mouth daily as needed for fluid or edema. 30 tablet 2  . gabapentin (NEURONTIN) 100 MG capsule TAKE 1 CAPSULE BY MOUTH ONCE DAILY THEN INCREASE EVERY 2 TO 3 DAYS UP TO TAKING THREE TIMES DAILY OR 3 CAPSULES AT BEDTIME 90 capsule 0  . levocetirizine (XYZAL) 5 MG tablet Take 0.5 tablets (2.5 mg total) by mouth every other day. 23 tablet 4  . Melatonin 10 MG CAPS Take 10 mg by mouth at bedtime. 30 capsule 2  . mupirocin ointment (BACTROBAN) 2 % Apply 1 application topically 2 (two) times daily as needed. For up to 7-10 days as needed to help heal and prevent  infection. 22 g 2  . oxyCODONE (OXY IR/ROXICODONE) 5 MG immediate release tablet Take 1 tablet (5 mg total) by mouth 4 (four) times daily for 7 days. Max: 4/day. Must last 7 days. 28 tablet 0  . [START ON 10/01/2020] oxyCODONE (OXY IR/ROXICODONE) 5 MG immediate release tablet Take 1 tablet (5 mg total) by mouth 3 (three) times daily for 7 days. Max: 3/day. Must last 7 days. 21 tablet 0  . [START ON 10/08/2020] oxyCODONE (OXY IR/ROXICODONE) 5 MG immediate release tablet Take 1 tablet (5 mg total) by mouth 2 (two) times daily for 7 days. Max: 2/day. Must last 7 days. 14 tablet 0  . [START ON 10/15/2020] oxyCODONE (OXY IR/ROXICODONE) 5 MG immediate release tablet Take 1 tablet (5 mg total) by mouth daily for 7 days. Max: 1/day. Must last 7 days. 7 tablet 0  . [START ON 12/10/2020] oxyCODONE (OXY IR/ROXICODONE) 5 MG immediate release tablet Take 1 tablet (5 mg total) by mouth daily for 7 days. Max: 1/day. Must last 7 days. 7 tablet 0  . [START ON 12/03/2020] oxyCODONE (OXY IR/ROXICODONE) 5 MG immediate release tablet Take 1 tablet (5 mg total) by mouth 2 (two) times daily for 7 days. Max: 2/day. Must last 7 days. 14 tablet 0  . [START ON 11/26/2020] oxyCODONE (OXY IR/ROXICODONE) 5 MG immediate release tablet Take 1 tablet (5 mg total) by mouth 3 (three) times daily for 7 days. Max: 3/day. Must last 7 days. 21 tablet 0  . [START ON 11/19/2020] oxyCODONE (OXY IR/ROXICODONE) 5 MG immediate release tablet Take 1 tablet (5 mg total) by mouth 4 (four) times daily for 7 days. Max: 4/day. Must last 7 days. 28 tablet 0  . [START ON 11/12/2020] oxyCODONE (OXY IR/ROXICODONE) 5 MG immediate release tablet Take 1 tablet (5 mg total) by mouth 5 (five) times daily for 7 days. Max: 5/day. Must last 7 days. 35 tablet 0  . [START ON 11/05/2020] oxyCODONE (OXY IR/ROXICODONE) 5 MG immediate release tablet Take 1 tablet (5 mg total) by mouth 6 (six) times daily for 7 days. Max: 6/day. Must last 7 days. 42 tablet 0  . [START ON 10/29/2020]  oxyCODONE (OXY IR/ROXICODONE) 5 MG immediate release tablet Take 2 tablets (10 mg total) by mouth 3 (three) times daily AND 1 tablet (5 mg total) daily with lunch. Do all this for 7 days. For the next 7 days take 2 tab with breakfast, dinner, and at bedtime. Take only 1 tab at noon. Max: 7/day. Must last 7 days.. 49 tablet 0  . Oxycodone HCl 10 MG TABS Take 1 tablet (10 mg total) by mouth every 6 (six) hours as needed. Must last 30 days. 120 tablet 0  . [START ON 10/24/2020] Oxycodone HCl 10 MG TABS Take 1 tablet (10 mg total) by mouth 4 (four) times daily for  5 days. Max: 4/day. Must last 5 days. 20 tablet 0  . Oxycodone HCl 20 MG TABS Take 1 tablet (20 mg total) by mouth every 8 (eight) hours as needed. Must last 30 days 90 tablet 0  . OXYGEN Inhale 2 L into the lungs at bedtime.     . polyethylene glycol (MIRALAX / GLYCOLAX) 17 g packet Take 17 g by mouth daily as needed for moderate constipation. 90 packet 1  . rOPINIRole (REQUIP) 2 MG tablet Take 2 tablets (4 mg total) by mouth at bedtime. 180 tablet 1  . Wheat Dextrin (BENEFIBER) POWD Take 6 g by mouth 3 (three) times daily before meals. (2 tsp = 6 g) 730 g 8   No current facility-administered medications on file prior to visit.    There are no Patient Instructions on file for this visit. No follow-ups on file.   Kris Hartmann, NP

## 2020-09-29 NOTE — Progress Notes (Signed)
PROVIDER NOTE: Information contained herein reflects review and annotations entered in association with encounter. Interpretation of such information and data should be left to medically-trained personnel. Information provided to patient can be located elsewhere in the medical record under "Patient Instructions". Document created using STT-dictation technology, any transcriptional errors that may result from process are unintentional.    Patient: Kristin [name]  Service Category: Procedure  Provider: Gaspar Cola, MD  DOB: 06/27/1930  DOS: 09/30/2020  Location: [facility]  MRN: [id number]  Setting: Ambulatory - outpatient  Referring Provider: [name] *  Type: Established Patient  Specialty: Interventional Pain Management  PCP: Olin Hauser, DO   Primary Reason for Visit: Interventional Pain Management Treatment. CC: Back Pain  Procedure:          Anesthesia, Analgesia, Anxiolysis:  Procedure #1: Type: Medial Branch Facet Block L5/R3 Primary Purpose: Palliative Region: Lumbar Level: L2, L3, L4, L5, & S1 Medial Branch Level(s) Target Area: For Lumbar Facet blocks, the target is the groove formed by the junction of the transverse process and superior articular process. For the L5 dorsal ramus, the target is the notch between superior articular process and sacral ala. For the S1 dorsal ramus, the target is the superior and lateral edge of the posterior S1 Sacral foramen. Approach: Posterior, paramedial, percutaneous approach. Laterality: Bilateral  Procedure #2: Type: Sacroiliac Joint Block  L3/R1  Primary Purpose: Diagnostic/Therapeutric Region: Posterior Lumbosacral Level: PSIS (Posterior Superior Iliac Spine) Sacroiliac Joint Target Area: For upper sacroiliac joint block(s), the target is the superior and posterior margin of the sacroiliac joint. Approach: Ipsilateral approach. Laterality: Bilateral  Type: Moderate (Conscious) Sedation combined  with Local Anesthesia Indication(s): Analgesia and Anxiety Route: Intravenous (IV) IV Access: Secured Sedation: Meaningful verbal contact was maintained at all times during the procedure  Local Anesthetic: Lidocaine 1-2%  Position: Prone   Indications: 1. Lumbar facet syndrome (Bilateral) (L>R)   2. Lumbar facet hypertrophy (L1-2, L2-3, and L4-5)   3. Spondylosis without myelopathy or radiculopathy, lumbosacral region   4. Chronic sacroiliac pain (Bilateral)   5. Other specified dorsopathies, sacral and sacrococcygeal region   6. Osteoarthritis of sacroiliac joints (Bilateral) ([place])   7. DDD (degenerative disc disease), lumbosacral   8. Chronic low back pain (Bilateral) w/o sciatica   9. Chronic anticoagulation (Eliquis)    Pain Score: Pre-procedure: 5 /10 Post-procedure: 0-No pain/10   Note: Today the patient had some questions regarding the "Drug Holiday".  She was wondering if it was intentional that we jump from the 20 mg to 10 mg pills.  I did explain to the patient that yes we had done that, but she is also supposed to be having some 5 mg pills which she will be using to decrease her daily dose, every week, by 5 mg.  However, when I went today to the PMP to review the process, I noticed that the pharmacy had not provided the patient with the 5 mg pills.  Today we will be contacting the patient's daughter and go over the process so that it is clear that when they go to the pharmacy they need to be picking up prescriptions for the 10 mg oxycodone as well as 5 mg oxycodone prescriptions, every week, to slowly taper her down instead of abruptly dropping her.  I really do not understand why the pharmacist did not follow my orders written in the prescriptions.  It was very clear that they need to provide her with the prescriptions that I  wrote in the combination that I wrote it and in the order that I also order that she should be getting it.  Otherwise they may precipitate a withdrawal  syndrome.  Today, not only will we be contacting the patient's daughter and explained all of this to her, but I we will also contact the pharmacy and make sure that they understand the importance of providing the patient with this prescriptions in the correct order.  Pre-op H&P Assessment:  Kristin [name] is a 87 y.o. (year old), female patient, seen today for interventional treatment. She  has a past surgical history that includes Abdominal hysterectomy; Back surgery; Rotator cuff repair; Breast surgery; Cataract extraction w/PHACO (10/19/2011); Cataract extraction w/PHACO (11/02/2011); Carpal tunnel release; Joint replacement; Colon surgery; Pacemaker insertion (N/A, 12/24/2015); Cholecystectomy; Breast lumpectomy (Right, 2004); and Insert / replace / remove pacemaker. Ms. Secrist has a current medication list which includes the following prescription(s): acetaminophen, albuterol, bisacodyl, vitamin d, dextromethorphan-guaifenesin, diclofenac sodium, furosemide, gabapentin, levocetirizine, mupirocin ointment, oxycodone, [START ON 10/01/2020] oxycodone, [START ON 10/08/2020] oxycodone, [START ON 10/15/2020] oxycodone, [START ON 12/10/2020] oxycodone, [START ON 12/03/2020] oxycodone, [START ON 11/26/2020] oxycodone, [START ON 11/19/2020] oxycodone, [START ON 11/12/2020] oxycodone, [START ON 11/05/2020] oxycodone, [START ON 10/29/2020] oxycodone, oxycodone hcl, [START ON 10/24/2020] oxycodone hcl, oxygen-helium, polyethylene glycol, ropinirole, apixaban, melatonin, and benefiber, and the following Facility-Administered Medications: fentanyl and midazolam. Her primarily concern today is the Back Pain  Initial Vital Signs:  Pulse/HCG Rate: 78ECG Heart Rate: 60 Temp: (!) 96.8 F (36 C) Resp: 18 BP: (!) 123/97 SpO2: 99 % (2L o2)  BMI: Estimated body mass index is 23.89 kg/m as calculated from the following:   Height as of this encounter: 5\' 6"  (1.676 m).   Weight as of this encounter: 148 lb (67.1 kg).  Risk  Assessment: Allergies: Reviewed. She is allergic to flexeril [cyclobenzaprine hcl] and vancomycin.  Allergy Precautions: None required Coagulopathies: Reviewed. None identified.  Blood-thinner therapy: None at this time Active Infection(s): Reviewed. None identified. Ms. Montesano is afebrile  Site Confirmation: Ms. Casserly was asked to confirm the procedure and laterality before marking the site Procedure checklist: Completed Consent: Before the procedure and under the influence of no sedative(s), amnesic(s), or anxiolytics, the patient was informed of the treatment options, risks and possible complications. To fulfill our ethical and legal obligations, as recommended by the American Medical Association's Code of Ethics, I have informed the patient of my clinical impression; the nature and purpose of the treatment or procedure; the risks, benefits, and possible complications of the intervention; the alternatives, including doing nothing; the risk(s) and benefit(s) of the alternative treatment(s) or procedure(s); and the risk(s) and benefit(s) of doing nothing. The patient was provided information about the general risks and possible complications associated with the procedure. These may include, but are not limited to: failure to achieve desired goals, infection, bleeding, organ or nerve damage, allergic reactions, paralysis, and death. In addition, the patient was informed of those risks and complications associated to Spine-related procedures, such as failure to decrease pain; infection (i.e.: Meningitis, epidural or intraspinal abscess); bleeding (i.e.: epidural hematoma, subarachnoid hemorrhage, or any other type of intraspinal or peri-dural bleeding); organ or nerve damage (i.e.: Any type of peripheral nerve, nerve root, or spinal cord injury) with subsequent damage to sensory, motor, and/or autonomic systems, resulting in permanent pain, numbness, and/or weakness of one or several areas of the body;  allergic reactions; (i.e.: anaphylactic reaction); and/or death. Furthermore, the patient was informed of those risks and complications associated  with the medications. These include, but are not limited to: allergic reactions (i.e.: anaphylactic or anaphylactoid reaction(s)); adrenal axis suppression; blood sugar elevation that in diabetics may result in ketoacidosis or comma; water retention that in patients with history of congestive heart failure may result in shortness of breath, pulmonary edema, and decompensation with resultant heart failure; weight gain; swelling or edema; medication-induced neural toxicity; particulate matter embolism and blood vessel occlusion with resultant organ, and/or nervous system infarction; and/or aseptic necrosis of one or more joints. Finally, the patient was informed that Medicine is not an exact science; therefore, there is also the possibility of unforeseen or unpredictable risks and/or possible complications that may result in a catastrophic outcome. The patient indicated having understood very clearly. We have given the patient no guarantees and we have made no promises. Enough time was given to the patient to ask questions, all of which were answered to the patient's satisfaction. Ms. Garno has indicated that she wanted to continue with the procedure. Attestation: I, the ordering provider, attest that I have discussed with the patient the benefits, risks, side-effects, alternatives, likelihood of achieving goals, and potential problems during recovery for the procedure that I have provided informed consent. Date  Time: 09/30/2020 10:39 AM  Pre-Procedure Preparation:  Monitoring: As per clinic protocol. Respiration, ETCO2, SpO2, BP, heart rate and rhythm monitor placed and checked for adequate function Safety Precautions: Patient was assessed for positional comfort and pressure points before starting the procedure. Time-out: I initiated and conducted the "Time-out"  before starting the procedure, as per protocol. The patient was asked to participate by confirming the accuracy of the "Time Out" information. Verification of the correct person, site, and procedure were performed and confirmed by me, the nursing staff, and the patient. "Time-out" conducted as per Joint Commission's Universal Protocol (UP.01.01.01). Time: 1124  Description of Procedure #1:   Time-out: "Time-out" completed before starting procedure, as per protocol. Area Prepped: Entire Posterior Lumbosacral Region DuraPrep (Iodine Povacrylex [0.7% available iodine] and Isopropyl Alcohol, 74% w/w) Safety Precautions: Aspiration looking for blood return was conducted prior to all injections. At no point did we inject any substances, as a needle was being advanced. No attempts were made at seeking any paresthesias. Safe injection practices and needle disposal techniques used. Medications properly checked for expiration dates. SDV (single dose vial) medications used.  Description of the Procedure: Protocol guidelines were followed. The patient was placed in position over the fluoroscopy table. The target area was identified and the area prepped in the usual manner. Skin & deeper tissues infiltrated with local anesthetic. Appropriate amount of time allowed to pass for local anesthetics to take effect. The procedure needle was introduced through the skin, ipsilateral to the reported pain, and advanced to the target area. Employing the "Medial Branch Technique", the needles were advanced to the angle made by the superior and medial portion of the transverse process, and the lateral and inferior portion of the superior articulating process of the targeted vertebral bodies. This area is known as "Burton's Eye" or the "Eye of the [place] Dog". A procedure needle was introduced through the skin, and this time advanced to the angle made by the superior and medial border of the sacral ala, and the lateral border of the  S1 vertebral body. This last needle was later repositioned at the superior and lateral border of the posterior S1 foramen. Negative aspiration confirmed. Solution injected in intermittent fashion, asking for systemic symptoms every 0.5cc of injectate. The needles were then removed  and the area cleansed, making sure to leave some of the prepping solution back to take advantage of its long term bactericidal properties. Start Time: 1124 hrs. Materials:  Needle(s) Type: Spinal Needle Gauge: 22G Length: 3.5-in Medication(s): Please see orders for medications and dosing details.  Description of Procedure # 2:   Area Prepped: Entire Posterior Lumbosacral Region DuraPrep (Iodine Povacrylex [0.7% available iodine] and Isopropyl Alcohol, 74% w/w) Safety Precautions: Aspiration looking for blood return was conducted prior to all injections. At no point did we inject any substances, as a needle was being advanced. No attempts were made at seeking any paresthesias. Safe injection practices and needle disposal techniques used. Medications properly checked for expiration dates. SDV (single dose vial) medications used. Description of the Procedure: Protocol guidelines were followed. The patient was placed in position over the fluoroscopy table. The target area was identified and the area prepped in the usual manner. Skin & deeper tissues infiltrated with local anesthetic. Appropriate amount of time allowed to pass for local anesthetics to take effect. The procedure needle was advanced under fluoroscopic guidance into the sacroiliac joint until a firm endpoint was obtained. Proper needle placement secured. Negative aspiration confirmed. Solution injected in intermittent fashion, asking for systemic symptoms every 0.5cc of injectate. The needles were then removed and the area cleansed, making sure to leave some of the prepping solution back to take advantage of its long term bactericidal properties. Vitals:   09/30/20  1135 09/30/20 1145 09/30/20 1155 09/30/20 1204  BP: 120/68 119/72 123/76 125/82  Pulse:  63 60 76  Resp: 18 20 16 16   Temp:      SpO2: 98% 99% 100% 99%  Weight:      Height:        End Time: 1135 hrs. Materials:  Needle(s) Type: Spinal Needle Gauge: 22G Length: 3.5-in Medication(s): Please see orders for medications and dosing details.  Imaging Guidance (Spinal):          Type of Imaging Technique: Fluoroscopy Guidance (Spinal) Indication(s): Assistance in needle guidance and placement for procedures requiring needle placement in or near specific anatomical locations not easily accessible without such assistance. Exposure Time: Please see nurses notes. Contrast: None used. Fluoroscopic Guidance: I was personally present during the use of fluoroscopy. "Tunnel Vision Technique" used to obtain the best possible view of the target area. Parallax error corrected before commencing the procedure. "Direction-depth-direction" technique used to introduce the needle under continuous pulsed fluoroscopy. Once target was reached, antero-posterior, oblique, and lateral fluoroscopic projection used confirm needle placement in all planes. Images permanently stored in EMR. Interpretation: No contrast injected. I personally interpreted the imaging intraoperatively. Adequate needle placement confirmed in multiple planes. Permanent images saved into the patient's record.  Antibiotic Prophylaxis:   Anti-infectives (From admission, onward)   None     Indication(s): None identified  Post-operative Assessment:  Post-procedure Vital Signs:  Pulse/HCG Rate: 7660 Temp: (!) 96.8 F (36 C) Resp: 16 BP: 125/82 SpO2: 99 %  EBL: None  Complications: No immediate post-treatment complications observed by team, or reported by patient.  Note: The patient tolerated the entire procedure well. A repeat set of vitals were taken after the procedure and the patient was kept under observation following  institutional policy, for this type of procedure. Post-procedural neurological assessment was performed, showing return to baseline, prior to discharge. The patient was provided with post-procedure discharge instructions, including a section on how to identify potential problems. Should any problems arise concerning this procedure, the patient was given  instructions to immediately contact us, at any time, without hesitation. In any case, we plan to contact the patient by telephone for a follow-up status report regarding this interventional procedure.  Comments:  No additional relevant information.  Plan of Care  Orders:  Orders Placed This Encounter  Procedures  . LUMBAR FACET(MEDIAL BRANCH NERVE BLOCK) MBNB    Scheduling Instructions:     Procedure: Lumbar facet block (AKA.: Lumbosacral medial branch nerve block)     Side: Bilateral     Level: L3-4, L4-5, & L5-S1 Facets (L2, L3, L4, L5, & S1 Medial Branch Nerves)     Sedation: Patient's choice.     Timeframe: Today    Order Specific Question:   Where will this procedure be performed?    Answer:   [facility]  . SACROILIAC JOINT INJECTION    Scheduling Instructions:     Side: Bilateral     Sedation: Patient's choice.     Timeframe: Today    Order Specific Question:   Where will this procedure be performed?    Answer:   [facility]  . DG PAIN CLIN[facility] C-ARM 1-60 MIN NO REPORT    Intraoperative interpretation by procedural physician at [place].    Standing Status:   Standing    Number of Occurrences:   1    Order Specific Question:   Reason for exam:    Answer:   Assistance in needle guidance and placement for procedures requiring needle placement in or near specific anatomical locations not easily accessible without such assistance.  . Informed Consent Details: Physician/Practitioner Attestation; Transcribe to consent form and obtain patient signature    Nursing Order: Transcribe to consent form and obtain  patient signature. Note: Always confirm laterality of pain with Ms. Lungren, before procedure.    Order Specific Question:   Physician/Practitioner attestation of informed consent for procedure/surgical case    Answer:   I, the physician/practitioner, attest that I have discussed with the patient the benefits, risks, side effects, alternatives, likelihood of achieving goals and potential problems during recovery for the procedure that I have provided informed consent.    Order Specific Question:   Procedure    Answer:   Lumbar Facet Block  under fluoroscopic guidance    Order Specific Question:   Physician/Practitioner performing the procedure    Answer:   Ellen Mayol A. Dossie Arbour MD    Order Specific Question:   Indication/Reason    Answer:   Low Back Pain, with our without leg pain, due to Facet Joint Arthralgia (Joint Pain) Spondylosis (Arthritis of the Spine), without myelopathy or radiculopathy (Nerve Damage).  . Care order/instruction: Please confirm that the patient has stopped the Eliquis (Apixaban) x 3 days prior to procedure or surgery.    Please confirm that the patient has stopped the Eliquis (Apixaban) x 3 days prior to procedure or surgery.    Standing Status:   Standing    Number of Occurrences:   1  . Provide equipment / supplies at bedside    "Block Tray" (Disposable  single use) Needle type: SpinalSpinal Amount/quantity: 4 Size: Regular (3.5-inch) Gauge: 22G    Standing Status:   Standing    Number of Occurrences:   1    Order Specific Question:   Specify    Answer:   Block Tray  . Informed Consent Details: Physician/Practitioner Attestation; Transcribe to consent form and obtain patient signature    Nursing Order: Transcribe to consent form and obtain patient signature.  Note: Always confirm laterality of pain with Ms. Kentner, before procedure.    Order Specific Question:   Physician/Practitioner attestation of informed consent for procedure/surgical case    Answer:   I, the  physician/practitioner, attest that I have discussed with the patient the benefits, risks, side effects, alternatives, likelihood of achieving goals and potential problems during recovery for the procedure that I have provided informed consent.    Order Specific Question:   Procedure    Answer:   Sacroiliac Joint Block    Order Specific Question:   Physician/Practitioner performing the procedure    Answer:   Jontae Sonier A. Dossie Arbour, MD    Order Specific Question:   Indication/Reason    Answer:   Chronic Low Back and Hip Pain secondary to Sacroiliac Joint Pain (Arthralgia/Arthropathy)  . Provide equipment / supplies at bedside    "Block Tray" (Disposable  single use) Needle type: SpinalSpinal Amount/quantity: 1 Size: Medium (5-inch) Gauge: 22G    Standing Status:   Standing    Number of Occurrences:   1    Order Specific Question:   Specify    Answer:   Block Tray  . Bleeding precautions    Standing Status:   Standing    Number of Occurrences:   1   Chronic Opioid Analgesic:  Oxycodone IR 20 mg, 1 tab PO q 8 hrs (60 mg/day of oxycodone) MME/day: 90 mg/day.   Medications ordered for procedure: Meds ordered this encounter  Medications  . lidocaine (XYLOCAINE) 2 % (with pres) injection 400 mg  . lactated ringers infusion 1,000 mL  . midazolam (VERSED) 5 MG/5ML injection 1-2 mg    Make sure Flumazenil is available in the pyxis when using this medication. If oversedation occurs, administer 0.2 mg IV over 15 sec. If after 45 sec no response, administer 0.2 mg again over 1 min; may repeat at 1 min intervals; not to exceed 4 doses (1 mg)  . fentaNYL (SUBLIMAZE) injection 25-50 mcg    Make sure Narcan is available in the pyxis when using this medication. In the event of respiratory depression (RR< 8/min): Titrate NARCAN (naloxone) in increments of 0.1 to 0.2 mg IV at 2-3 minute intervals, until desired degree of reversal.  . ropivacaine (PF) 2 mg/mL (0.2%) (NAROPIN) injection 18 mL  .  triamcinolone acetonide (KENALOG-40) injection 80 mg  . methylPREDNISolone acetate (DEPO-MEDROL) injection 80 mg  . ropivacaine (PF) 2 mg/mL (0.2%) (NAROPIN) injection 9 mL   Medications administered: We administered lidocaine, lactated ringers, midazolam, fentaNYL, ropivacaine (PF) 2 mg/mL (0.2%), triamcinolone acetonide, methylPREDNISolone acetate, and ropivacaine (PF) 2 mg/mL (0.2%).  See the medical record for exact dosing, route, and time of administration.  Follow-up plan:   Return in about 2 weeks (around 10/14/2020) for (F2F), (PP) Follow-up.       Interventional Therapies  Risk  Complexity Considerations:   NOTE: ELIQUIS ANTICOAGULATION (Stop: x3 days prior to procedure  Restart 6 hours after procedures)   Planned  Pending:   Pending further evaluation   Under consideration:   Diagnostic right SI joint block Possible bilateral SI joint RFA Diagnostic bilateral femoral nerve + obturator NB Possible bilateral femoral nerve + obturator nerve RFA Diagnostic right IA shoulder joint injection   Completed:   Diagnostic/therapeutic right L2 TFESI x1 (01/31/2020)  Diagnostic/therapeutic left L4 TFESI x1 (01/31/2020) Diagnostic left SI joint block x2 (09/27/2019) Palliative left CESI x2 (10/13/2017) Palliative left trochanteric bursa injection x1 (03/27/2019)  Palliative right IA hip inj. x1  (100% on the  right) (02/08/2019) Palliative left IA hip inj. x2 (02/08/2019) Palliative right lumbar facet MBB x2(01/10/2020) Palliative left lumbar facet MBB x4(01/10/2020) Palliative right lumbar facet RFA x1 (09/19/18) Palliative left lumbar facet RFA x3 (08/01/18) Diagnostic/therapeutic right suprascapular NB x3 (05/10/2017)  Palliative bilateral suprascapular nerve RFA x1(07/13/18)   Therapeutic  Palliative (PRN) options:   Diagnostic left SI joint block #2  Palliative left CESI #3  Palliative left trochanteric bursa injection #2  Palliative right IA hip inj. #2  (100% on the  right) Palliative left IA hip inj. #3   Palliative right lumbar facet block #3 Palliative left lumbar facet block #5 Palliative right lumbar facet RFA #2  Palliative left lumbar facet RFA #4  Palliative bilateral suprascapular nerve block Palliative bilateral suprascapular nerve RFA #2   Interventional management options:  Considering:      Palliative PRN treatment(s):       Recent Visits Date Type Provider Dept  09/17/20 Office Visit Milinda Pointer, MD [facility]  Showing recent visits within past 90 days and meeting all other requirements Today's Visits Date Type Provider Dept  09/30/20 Procedure visit Milinda Pointer, MD [facility]  Showing today's visits and meeting all other requirements Future Appointments Date Type Provider Dept  10/20/20 Appointment Milinda Pointer, MD [facility]  Showing future appointments within next 90 days and meeting all other requirements  Disposition: Discharge home  Discharge (Date  Time): 09/30/2020; 1206 hrs.   Primary Care Physician: Olin Hauser, DO Location: [facility] Note by: Gaspar Cola, MD Date: 09/30/2020; Time: 12:10 PM  Disclaimer:  Medicine is not an Chief Strategy Officer. The only guarantee in medicine is that nothing is guaranteed. It is important to note that the decision to proceed with this intervention was based on the information collected from the patient. The Data and conclusions were drawn from the patient's questionnaire, the interview, and the physical examination. Because the information was provided in large part by the patient, it cannot be guaranteed that it has not been purposely or unconsciously manipulated. Every effort has been made to obtain as much relevant data as possible for this evaluation. It is important to note that the conclusions that lead to this procedure are derived in large part from the available data. Always  take into account that the treatment will also be dependent on availability of resources and existing treatment guidelines, considered by other Pain Management Practitioners as being common knowledge and practice, at the time of the intervention. For Medico-Legal purposes, it is also important to point out that variation in procedural techniques and pharmacological choices are the acceptable norm. The indications, contraindications, technique, and results of the above procedure should only be interpreted and judged by a Board-Certified Interventional Pain Specialist with extensive familiarity and expertise in the same exact procedure and technique.

## 2020-09-30 ENCOUNTER — Ambulatory Visit (HOSPITAL_BASED_OUTPATIENT_CLINIC_OR_DEPARTMENT_OTHER): Payer: Medicare HMO | Admitting: Pain Medicine

## 2020-09-30 ENCOUNTER — Other Ambulatory Visit: Payer: Self-pay

## 2020-09-30 ENCOUNTER — Ambulatory Visit
Admission: RE | Admit: 2020-09-30 | Discharge: 2020-09-30 | Disposition: A | Discharge status: Home / Self Care | Payer: Medicare HMO | Source: Ambulatory Visit | Attending: Pain Medicine | Admitting: Pain Medicine

## 2020-09-30 ENCOUNTER — Encounter: Payer: Self-pay | Admitting: Pain Medicine

## 2020-09-30 ENCOUNTER — Telehealth: Payer: Self-pay | Admitting: *Deleted

## 2020-09-30 VITALS — BP 125/82 | HR 76 | Temp 96.8°F | Resp 16 | Ht 66.0 in | Wt 148.0 lb

## 2020-09-30 DIAGNOSIS — M545 Low back pain, unspecified: Secondary | ICD-10-CM | POA: Insufficient documentation

## 2020-09-30 DIAGNOSIS — M47816 Spondylosis without myelopathy or radiculopathy, lumbar region: Secondary | ICD-10-CM | POA: Diagnosis not present

## 2020-09-30 DIAGNOSIS — G8929 Other chronic pain: Secondary | ICD-10-CM | POA: Insufficient documentation

## 2020-09-30 DIAGNOSIS — M461 Sacroiliitis, not elsewhere classified: Secondary | ICD-10-CM | POA: Insufficient documentation

## 2020-09-30 DIAGNOSIS — M47817 Spondylosis without myelopathy or radiculopathy, lumbosacral region: Secondary | ICD-10-CM | POA: Diagnosis not present

## 2020-09-30 DIAGNOSIS — M533 Sacrococcygeal disorders, not elsewhere classified: Secondary | ICD-10-CM | POA: Diagnosis not present

## 2020-09-30 DIAGNOSIS — M5388 Other specified dorsopathies, sacral and sacrococcygeal region: Secondary | ICD-10-CM | POA: Diagnosis not present

## 2020-09-30 DIAGNOSIS — Z7901 Long term (current) use of anticoagulants: Secondary | ICD-10-CM | POA: Diagnosis not present

## 2020-09-30 DIAGNOSIS — M5137 Other intervertebral disc degeneration, lumbosacral region: Secondary | ICD-10-CM

## 2020-09-30 MED ORDER — LIDOCAINE HCL (PF) 2 % IJ SOLN
INTRAMUSCULAR | Status: AC
  Filled 2020-09-30: qty 5

## 2020-09-30 MED ORDER — LACTATED RINGERS IV SOLN
1000.0000 mL | Freq: Once | INTRAVENOUS | Status: AC
  Administered 2020-09-30: 1000 mL via INTRAVENOUS

## 2020-09-30 MED ORDER — ROPIVACAINE HCL 2 MG/ML IJ SOLN
9.0000 mL | Freq: Once | INTRAMUSCULAR | Status: AC
  Administered 2020-09-30: 9 mL via INTRA_ARTICULAR
  Filled 2020-09-30: qty 10

## 2020-09-30 MED ORDER — METHYLPREDNISOLONE ACETATE 80 MG/ML IJ SUSP
80.0000 mg | Freq: Once | INTRAMUSCULAR | Status: AC
  Administered 2020-09-30: 80 mg via INTRA_ARTICULAR
  Filled 2020-09-30: qty 1

## 2020-09-30 MED ORDER — ROPIVACAINE HCL 2 MG/ML IJ SOLN
18.0000 mL | Freq: Once | INTRAMUSCULAR | Status: AC
  Administered 2020-09-30: 18 mL via PERINEURAL
  Filled 2020-09-30: qty 20

## 2020-09-30 MED ORDER — TRIAMCINOLONE ACETONIDE 40 MG/ML IJ SUSP
80.0000 mg | Freq: Once | INTRAMUSCULAR | Status: AC
  Administered 2020-09-30: 80 mg
  Filled 2020-09-30: qty 2

## 2020-09-30 MED ORDER — MIDAZOLAM HCL 5 MG/5ML IJ SOLN
1.0000 mg | INTRAMUSCULAR | Status: DC | PRN
  Administered 2020-09-30: 1 mg via INTRAVENOUS
  Filled 2020-09-30: qty 5

## 2020-09-30 MED ORDER — FENTANYL CITRATE (PF) 100 MCG/2ML IJ SOLN
25.0000 ug | INTRAMUSCULAR | Status: DC | PRN
  Administered 2020-09-30: 50 ug via INTRAVENOUS
  Filled 2020-09-30: qty 2

## 2020-09-30 MED ORDER — LIDOCAINE HCL 2 % IJ SOLN
20.0000 mL | Freq: Once | INTRAMUSCULAR | Status: AC
  Administered 2020-09-30: 200 mg
  Filled 2020-09-30: qty 20

## 2020-09-30 NOTE — Patient Instructions (Signed)

## 2020-09-30 NOTE — Progress Notes (Signed)
Safety precautions to be maintained throughout the outpatient stay will include: orient to surroundings, keep bed in low position, maintain call bell within reach at all times, provide assistance with transfer out of bed and ambulation.  

## 2020-09-30 NOTE — Telephone Encounter (Signed)
Spoke with daughter [name] about the oxy titration. Will need to consult with Dr. Dossie Arbour about this and get back to her.

## 2020-10-01 ENCOUNTER — Telehealth: Payer: Self-pay

## 2020-10-01 ENCOUNTER — Telehealth: Payer: Self-pay | Admitting: *Deleted

## 2020-10-01 ENCOUNTER — Other Ambulatory Visit: Payer: Self-pay | Admitting: Pain Medicine

## 2020-10-01 DIAGNOSIS — I10 Essential (primary) hypertension: Secondary | ICD-10-CM | POA: Diagnosis not present

## 2020-10-01 DIAGNOSIS — Z79899 Other long term (current) drug therapy: Secondary | ICD-10-CM

## 2020-10-01 DIAGNOSIS — Z9889 Other specified postprocedural states: Secondary | ICD-10-CM | POA: Diagnosis not present

## 2020-10-01 DIAGNOSIS — I5033 Acute on chronic diastolic (congestive) heart failure: Secondary | ICD-10-CM | POA: Diagnosis not present

## 2020-10-01 DIAGNOSIS — J449 Chronic obstructive pulmonary disease, unspecified: Secondary | ICD-10-CM | POA: Diagnosis not present

## 2020-10-01 DIAGNOSIS — I4892 Unspecified atrial flutter: Secondary | ICD-10-CM | POA: Diagnosis not present

## 2020-10-01 DIAGNOSIS — I4891 Unspecified atrial fibrillation: Secondary | ICD-10-CM | POA: Diagnosis not present

## 2020-10-01 DIAGNOSIS — I5030 Unspecified diastolic (congestive) heart failure: Secondary | ICD-10-CM | POA: Diagnosis not present

## 2020-10-01 DIAGNOSIS — G894 Chronic pain syndrome: Secondary | ICD-10-CM

## 2020-10-01 DIAGNOSIS — I5032 Chronic diastolic (congestive) heart failure: Secondary | ICD-10-CM | POA: Diagnosis not present

## 2020-10-01 DIAGNOSIS — Z950 Presence of cardiac pacemaker: Secondary | ICD-10-CM | POA: Diagnosis not present

## 2020-10-01 NOTE — Telephone Encounter (Signed)
After meeting with Dr. Dossie Arbour, he would like the patient AND a responsible family member to come in for a F2F evaluation to discuss her medications and the titration.  1) The daughter informed me yesterday that when the patient was taking the 20 mg dose that there were days that the patient only took two doses instead of the scheduled three because she would be asleep or the dose would be during the sleeping hours.   2) The daughter informed me that there had been an incident where the patient had accidentally taken an extra dose of the 20 mg  and had to come to the ED for over sedation.  3) Due to the intricacy of the titration schedule and having not started it out as prescribed, Dr. Dossie Arbour would like for the patient to come in for a face to face visit WITH a family member.   4) They must bring ALL unused medications and containers for [name] to count/dispose of if necessary.    I called and spoke with the daughter and she is in agreement with the F2F and states she will come with the patient and bring ALL the surplus and current medication bottles in their perspective containers.

## 2020-10-01 NOTE — Telephone Encounter (Signed)
Her daughter called back and she said she spoke to the nurse yesterday with questions about how to taper her medication. Please call back. Marland Kitchen

## 2020-10-01 NOTE — Telephone Encounter (Signed)
Spoke with patient re; procedure on yesterday no questions or concerns.  States it did not do much good.  Rationale explained re; steroids and how they work.  Patient verbalizes u/o information.

## 2020-10-01 NOTE — Progress Notes (Signed)
Yesterday we identified that the patient was having some issues with the opioid taper that I have prescribed for her.  When I checked the PMP, she had not picked up her oxycodone 5 mg pills.  However, since she was having problems understanding what we were trying to do, we contacted the patient's daughter.  As it turns out, she provided [name] with a significant amount of additional information that we did not have.  To start with, she indicated that 1 point the patient had forgotten having taken one of her oxycodone 20 mg pills and took a second 1.  She became unresponsive and the family took her to the emergency room where they kept her under observation until she recovered.  She was not admitted to the hospital and they concluded that it had been accidental.  However, the patient never shared this information with [name].  Furthermore, we have been prescribing oxycodone IR 20 mg to be taken 3 times daily (every 8 hours).  However, the patient's daughter now informs [name] that she usually takes the medication twice a day and when she takes the medicine she apparently and will not wake up to take the third pill.  Therefore she has only been taking the oxycodone IR 20 mg twice daily.  Interestingly, the patient never told us this and also never brought in the excess amount of pills that she had.  By my account, the patient probably has a substantial surplus of medication and therefore we need to get all of this back and destroyed so that we can again regain control over her medicines.  Furthermore, the fact that she takes the medicines and goes to sleep would suggest that the dose is definitely too high.  Therefore we will be permanently discontinuing the 20 mg pills and if anything, we will continue to titrate down until we completely discontinue the opioids.  However, at this point it is a matter of getting all those pills back.  The daughter has been informed that we will be canceling all the prescriptions that we sent to  the pharmacy for the purpose of the opioid taper and no further prescriptions will be provided to the patient until we can get the excess back.

## 2020-10-02 ENCOUNTER — Ambulatory Visit (INDEPENDENT_AMBULATORY_CARE_PROVIDER_SITE_OTHER): Payer: Medicare HMO | Admitting: Family Medicine

## 2020-10-02 ENCOUNTER — Encounter: Payer: Self-pay | Admitting: Pain Medicine

## 2020-10-02 ENCOUNTER — Telehealth (INDEPENDENT_AMBULATORY_CARE_PROVIDER_SITE_OTHER): Payer: Self-pay

## 2020-10-02 ENCOUNTER — Other Ambulatory Visit: Payer: Self-pay

## 2020-10-02 ENCOUNTER — Encounter: Payer: Self-pay | Admitting: Family Medicine

## 2020-10-02 ENCOUNTER — Ambulatory Visit: Payer: Medicare HMO | Attending: Pain Medicine | Admitting: Pain Medicine

## 2020-10-02 VITALS — BP 105/62 | HR 64 | Temp 97.5°F | Ht 67.0 in | Wt 150.0 lb

## 2020-10-02 VITALS — BP 150/81 | HR 74 | Temp 97.7°F | Ht 67.0 in | Wt 149.2 lb

## 2020-10-02 DIAGNOSIS — M5412 Radiculopathy, cervical region: Secondary | ICD-10-CM | POA: Diagnosis not present

## 2020-10-02 DIAGNOSIS — I495 Sick sinus syndrome: Secondary | ICD-10-CM

## 2020-10-02 DIAGNOSIS — G8929 Other chronic pain: Secondary | ICD-10-CM | POA: Diagnosis not present

## 2020-10-02 DIAGNOSIS — N184 Chronic kidney disease, stage 4 (severe): Secondary | ICD-10-CM | POA: Diagnosis not present

## 2020-10-02 DIAGNOSIS — J302 Other seasonal allergic rhinitis: Secondary | ICD-10-CM | POA: Diagnosis not present

## 2020-10-02 DIAGNOSIS — T40601S Poisoning by unspecified narcotics, accidental (unintentional), sequela: Secondary | ICD-10-CM

## 2020-10-02 DIAGNOSIS — M5441 Lumbago with sciatica, right side: Secondary | ICD-10-CM | POA: Diagnosis not present

## 2020-10-02 DIAGNOSIS — J432 Centrilobular emphysema: Secondary | ICD-10-CM | POA: Diagnosis not present

## 2020-10-02 DIAGNOSIS — M79605 Pain in left leg: Secondary | ICD-10-CM | POA: Diagnosis not present

## 2020-10-02 DIAGNOSIS — N183 Chronic kidney disease, stage 3 unspecified: Secondary | ICD-10-CM

## 2020-10-02 DIAGNOSIS — M79604 Pain in right leg: Secondary | ICD-10-CM | POA: Insufficient documentation

## 2020-10-02 DIAGNOSIS — Z79899 Other long term (current) drug therapy: Secondary | ICD-10-CM | POA: Insufficient documentation

## 2020-10-02 DIAGNOSIS — F1129 Opioid dependence with unspecified opioid-induced disorder: Secondary | ICD-10-CM | POA: Diagnosis not present

## 2020-10-02 DIAGNOSIS — M5442 Lumbago with sciatica, left side: Secondary | ICD-10-CM | POA: Insufficient documentation

## 2020-10-02 DIAGNOSIS — Z79891 Long term (current) use of opiate analgesic: Secondary | ICD-10-CM

## 2020-10-02 DIAGNOSIS — G894 Chronic pain syndrome: Secondary | ICD-10-CM | POA: Diagnosis not present

## 2020-10-02 DIAGNOSIS — I129 Hypertensive chronic kidney disease with stage 1 through stage 4 chronic kidney disease, or unspecified chronic kidney disease: Secondary | ICD-10-CM | POA: Diagnosis not present

## 2020-10-02 DIAGNOSIS — N1831 Chronic kidney disease, stage 3a: Secondary | ICD-10-CM

## 2020-10-02 DIAGNOSIS — N189 Chronic kidney disease, unspecified: Secondary | ICD-10-CM | POA: Insufficient documentation

## 2020-10-02 DIAGNOSIS — I5032 Chronic diastolic (congestive) heart failure: Secondary | ICD-10-CM

## 2020-10-02 DIAGNOSIS — Z7189 Other specified counseling: Secondary | ICD-10-CM | POA: Insufficient documentation

## 2020-10-02 HISTORY — DX: Poisoning by unspecified narcotics, accidental (unintentional), sequela: T40.601S

## 2020-10-02 MED ORDER — OXYCODONE HCL 5 MG PO TABS: 5.0000 mg | ORAL_TABLET | Freq: Every day | ORAL | 0 refills | Status: DC

## 2020-10-02 MED ORDER — OXYCODONE HCL 5 MG PO TABS: 5.0000 mg | ORAL_TABLET | Freq: Two times a day (BID) | ORAL | 0 refills | Status: DC

## 2020-10-02 MED ORDER — OXYCODONE HCL 5 MG PO TABS: 5.0000 mg | ORAL_TABLET | Freq: Three times a day (TID) | ORAL | 0 refills | Status: DC

## 2020-10-02 MED ORDER — LEVOCETIRIZINE DIHYDROCHLORIDE 5 MG PO TABS: 2.5000 mg | ORAL_TABLET | ORAL | 4 refills | Status: DC

## 2020-10-02 MED ORDER — OXYCODONE HCL 5 MG PO TABS: 5.0000 mg | ORAL_TABLET | Freq: Four times a day (QID) | ORAL | 0 refills | Status: DC

## 2020-10-02 NOTE — Progress Notes (Signed)
Subjective:    [name], female    DOB: Jun 18, 1930, 87 y.o.   MRN: [id number]  Kristin [name] is a 87 y.o. female presenting on 10/02/2020 for Follow-up, Hypertension, Arthritis, and Back Pain (Been getting worst as time. She states she had injections for her back pain 2 days ago and has not notice any change.)   HPI   CHRONIC HTNCKD III Reportschecks her BP regularly at home on cuff Recently had some low BP she stopped Lisinopril, then BP elevated again she asks to restart Lisinopril. Current Meds -Lisinopril 10mg  daily, Furosemide 40mg  daily PRN Reports good compliance, took meds today. Tolerating well, w/o complaints. Lifestyle: - Diet:limited salt diet - Exercise:limited due to arthritis Admits occasional edema, mostly controlled Denies CP, dyspnea, HA, dizziness / lightheadedness  Chronic Pain Syndrome / Osteoarthritis multiple joints / Sacroiliac Pain / Lumbar Facet Syndrome / L Hip Pain Opioid Induced Constipation Opioid Dependence uncomplicated  Followed by [facility] Dr Despina Hick injection therapy She is on Oxycodone 20mg  q 8 hr PRN with some good results, helps her function She uses cane to assist with ambulation.  Cardiology / Diastolic CHF Chronic / Atrial Flutter With Atrial Fibrillation Followed by Dr Saralyn Pilar [facility] - chart review Last visit with Dr Josefa Half yesterday 10/01/20 Prior history reduced LVEF has improved, and has mild valvular insufficiency. History of Atrial Fib and Flutter in past minimally symptomatic and on Eliquis anticoagulation for primary prevention CVA. 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  Centrilobular Emphysema (COPD) / Former Smoker quit >30 years ago Not endorsing significant problem with emphysema. Currently breathing well, since former smoker. Not using inhaler. She has a nebulizer machine at home for PRN use albuterol only. Rarely uses. improved. She is still using  breathing treatments.  Bilateral Lower Extremity Edema Atherosclerosis PAD Upcoming angioplasty scheduled Dr Delana Meyer [facility] 10/14/20  Health Maintenance: UTD COVID Vaccine, added Booster 08/2020  Depression screen [facility] 2/9 09/30/2020 09/17/2020 05/13/2020  Decreased Interest 0 0 0  Down, Depressed, Hopeless 0 0 0  PHQ - 2 Score 0 0 0  Altered sleeping - - -  Tired, decreased energy - - -  Change in appetite - - -  Feeling bad or failure about yourself  - - -  Trouble concentrating - - -  Moving slowly or fidgety/restless - - -  Suicidal thoughts - - -  PHQ-9 Score - - -  Difficult doing work/chores - - -  Some recent data might be hidden    Social History   Tobacco Use  . Smoking status: Former Smoker    Packs/day: 1.25    Years: 40.00    Pack years: 50.00    Types: Cigarettes    Quit date: 12/16/1998    Years since quitting: 21.8  . Smokeless tobacco: Former Network engineer  . Vaping Use: Never used  Substance Use Topics  . Alcohol use: No    Alcohol/week: 0.0 standard drinks  . Drug use: Yes    Types: Oxycodone    Review of Systems  Constitutional: Negative for activity change, appetite change, chills, diaphoresis, fatigue and fever.  HENT: Negative for congestion and hearing loss.   Eyes: Negative for visual disturbance.  Respiratory: Negative for cough, chest tightness, shortness of breath and wheezing.   Cardiovascular: Negative for chest pain, palpitations and leg swelling.  Gastrointestinal: Negative for abdominal pain, constipation, diarrhea, nausea and vomiting.  Genitourinary: Negative for dysuria, frequency and hematuria.  Musculoskeletal: Negative for arthralgias and neck pain.  Skin: Negative for rash.  Allergic/Immunologic: Negative for environmental allergies.  Neurological: Negative for dizziness, weakness, light-headedness, numbness and headaches.  Hematological: Negative for adenopathy.  Psychiatric/Behavioral: Negative for behavioral problems,  dysphoric mood and sleep disturbance.   Per HPI unless specifically indicated above     Objective:    BP (!) 150/81   Pulse 74   Temp 97.7 F (36.5 C) (Temporal)   Ht 5\' 7"  (1.702 m)   Wt 149 lb 3.2 oz (67.7 kg)   SpO2 100%   BMI 23.37 kg/m   Wt Readings from Last 3 Encounters:  10/02/20 149 lb 3.2 oz (67.7 kg)  09/30/20 148 lb (67.1 kg)  09/24/20 151 lb (68.5 kg)    Physical Exam Vitals and nursing note reviewed.  Constitutional:      General: She is not in acute distress.    Appearance: She is well-developed and well-nourished. She is not diaphoretic.     Comments: Well-appearing, comfortable, cooperative  HENT:     Head: Normocephalic and atraumatic.     Mouth/Throat:     Mouth: Oropharynx is clear and moist.  Eyes:     General:        Right eye: No discharge.        Left eye: No discharge.     Extraocular Movements: EOM normal.     Conjunctiva/sclera: Conjunctivae normal.     Pupils: Pupils are equal, round, and reactive to light.  Neck:     Thyroid: No thyromegaly.  Cardiovascular:     Rate and Rhythm: Normal rate and regular rhythm.     Pulses: Intact distal pulses.     Heart sounds: Normal heart sounds. No murmur heard.   Pulmonary:     Effort: Pulmonary effort is normal. No respiratory distress.     Breath sounds: Normal breath sounds. No wheezing or rales.  Abdominal:     General: Bowel sounds are normal. There is no distension.     Palpations: Abdomen is soft. There is no mass.     Tenderness: There is no abdominal tenderness.  Musculoskeletal:        General: No tenderness or edema. Normal range of motion.     Cervical back: Normal range of motion and neck supple.     Comments: Upper / Lower Extremities: - Normal muscle tone, strength bilateral upper extremities 5/5, lower extremities 5/5  Lymphadenopathy:     Cervical: No cervical adenopathy.  Skin:    General: Skin is warm and dry.     Findings: No erythema or rash.  Neurological:      Mental Status: She is alert and oriented to person, place, and time.     Comments: Distal sensation intact to light touch all extremities  Psychiatric:        Mood and Affect: Mood and affect normal.        Behavior: Behavior normal.     Comments: Well groomed, good eye contact, normal speech and thoughts        Results for orders placed or performed during the hospital encounter of 16/10/96  Basic metabolic panel  Result Value Ref Range   Sodium 138 135 - 145 mmol/L   Potassium 4.9 3.5 - 5.1 mmol/L   Chloride 110 98 - 111 mmol/L   CO2 17 (L) 22 - 32 mmol/L   Glucose, Bld 72 70 - 99 mg/dL   BUN 58 (H) 8 - 23 mg/dL   Creatinine,  Ser 2.09 (H) 0.44 - 1.00 mg/dL   Calcium 9.4 8.9 - 10.3 mg/dL   GFR, Estimated 22 (L) >60 mL/min   Anion gap 11 5 - 15  Urinalysis, Routine w reflex microscopic Urine, Clean Catch  Result Value Ref Range   Color, Urine YELLOW YELLOW   APPearance CLEAR CLEAR   Specific Gravity, Urine 1.014 1.005 - 1.030   pH 5.0 5.0 - 8.0   Glucose, UA NEGATIVE NEGATIVE mg/dL   Hgb urine dipstick SMALL (A) NEGATIVE   Bilirubin Urine NEGATIVE NEGATIVE   Ketones, ur NEGATIVE NEGATIVE mg/dL   Protein, ur NEGATIVE NEGATIVE mg/dL   Nitrite NEGATIVE NEGATIVE   Leukocytes,Ua TRACE (A) NEGATIVE   RBC / HPF 0-5 0 - 5 RBC/hpf   WBC, UA 0-5 0 - 5 WBC/hpf   Bacteria, UA NONE SEEN NONE SEEN   Squamous Epithelial / LPF 0-5 0 - 5   Hyaline Casts, UA PRESENT   CBC with Differential  Result Value Ref Range   WBC 5.0 4.0 - 10.5 K/uL   RBC 3.97 3.87 - 5.11 MIL/uL   Hemoglobin 11.9 (L) 12.0 - 15.0 g/dL   HCT 40.2 36.0 - 46.0 %   MCV 101.3 (H) 80.0 - 100.0 fL   MCH 30.0 26.0 - 34.0 pg   MCHC 29.6 (L) 30.0 - 36.0 g/dL   RDW 14.0 11.5 - 15.5 %   Platelets 141 (L) 150 - 400 K/uL   nRBC 0.0 0.0 - 0.2 %   Neutrophils Relative % 33 %   Neutro Abs 1.7 1.7 - 7.7 K/uL   Lymphocytes Relative 46 %   Lymphs Abs 2.3 0.7 - 4.0 K/uL   Monocytes Relative 9 %   Monocytes Absolute 0.5 0.1 -  1.0 K/uL   Eosinophils Relative 11 %   Eosinophils Absolute 0.5 0.0 - 0.5 K/uL   Basophils Relative 1 %   Basophils Absolute 0.1 0.0 - 0.1 K/uL   Immature Granulocytes 0 %   Abs Immature Granulocytes 0.01 0.00 - 0.07 K/uL  Basic metabolic panel  Result Value Ref Range   Sodium 138 135 - 145 mmol/L   Potassium 5.2 (H) 3.5 - 5.1 mmol/L   Chloride 109 98 - 111 mmol/L   CO2 18 (L) 22 - 32 mmol/L   Glucose, Bld 84 70 - 99 mg/dL   BUN 52 (H) 8 - 23 mg/dL   Creatinine, Ser 1.80 (H) 0.44 - 1.00 mg/dL   Calcium 9.5 8.9 - 10.3 mg/dL   GFR, Estimated 27 (L) >60 mL/min   Anion gap 11 5 - 15      Assessment & Plan:   Problem List Items Addressed This Visit    Seasonal allergies   Relevant Medications   levocetirizine (XYZAL) 5 MG tablet   Chronic kidney disease (CKD), stage III (moderate) (HCC)    See A&P for HTN w CKD  Check BMET today for Creatinine trend. Prior elevated during hospitalization.      Relevant Orders   BASIC METABOLIC PANEL WITH GFR   Chronic diastolic CHF (congestive heart failure) (HCC)    Stable without acute exacerbation dCHF with preserved EF Followed by Dr Saralyn Pilar [facility] On furosemide diuretic      Relevant Medications   lisinopril (ZESTRIL) 10 MG tablet   Centrilobular emphysema ([place])    Stable without acute COPD exacerbation Former smoker >30 years ago Not on maintenance therapy Has nebulizer at home w/ albuterol      Relevant Medications   levocetirizine (  XYZAL) 5 MG tablet   Benign hypertension with CKD (chronic kidney disease) stage III (HCC) - Primary    Well-controlled HTN - Home BP readings reviewed  Complication with CKD III, dCHF    Plan:  1. Continue current BP regimen (RESTART) Lisinopril 10mg  daily, Furosemide up to 20-40mg  daily PRN 2. Encourage improved lifestyle - low sodium diet, regular exercise 3. Continue monitor BP outside office, bring readings to next visit, if persistently >140/90 or new symptoms notify office  sooner      Relevant Medications   lisinopril (ZESTRIL) 10 MG tablet   Other Relevant Orders   BASIC METABOLIC PANEL WITH GFR    Other Visit Diagnoses    Sick sinus syndrome ([place])   (Chronic)     Relevant Medications   lisinopril (ZESTRIL) 10 MG tablet      Orders Placed This Encounter  Procedures  . BASIC METABOLIC PANEL WITH GFR    Meds ordered this encounter  Medications  . levocetirizine (XYZAL) 5 MG tablet    Sig: Take 0.5 tablets (2.5 mg total) by mouth every other day.    Dispense:  23 tablet    Refill:  4      Follow up plan: Return in about 6 months (around 04/01/2021) for 6 month Annual Physical (AM apt, fasting lab AFTER).   [name], DO [facility] 10/02/2020, 10:52 AM

## 2020-10-02 NOTE — Progress Notes (Signed)
PROVIDER NOTE: Information contained herein reflects review and annotations entered in association with encounter. Interpretation of such information and data should be left to medically-trained personnel. Information provided to patient can be located elsewhere in the medical record under "Patient Instructions". Document created using STT-dictation technology, any transcriptional errors that may result from process are unintentional.    Patient: Kristin [name]  Service Category: E/M  Provider: Gaspar Cola, MD  DOB: 1929-10-10  DOS: 10/02/2020  Specialty: Interventional Pain Management  MRN: [id number]  Setting: Ambulatory outpatient  PCP: Olin Hauser, DO  Type: Established Patient    Referring Provider: [name] *  Location: Office  Delivery: Face-to-face     HPI  [name], a 87 y.o. year old female, is here today because of her Chronic pain syndrome [G89.4]. Ms. Strand primary complain today is Back Pain Last encounter: My last encounter with her was on 09/30/2020. Pertinent problems: Ms. 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); Epidural fibrosis; Lumbar foraminal stenosis (Severe Left L4-5); Chronic sacroiliac joint pain (Left); Osteoarthritis involving multiple joints; Chronic neck pain; Chronic hip pain (Bilateral); Chronic cervical radicular pain (3ry area of Pain) (Bilateral) (L>R); Chronic lower extremity pain  (2ry area of Pain) (Bilateral) (L>R); DDD (degenerative disc disease), cervical; Numbness and tingling of upper extremity (Bilateral); Chronic shoulder pain, Radicular (Bilateral) (L>R); Cervical facet syndrome (Bilateral) (L>R); Cervical (3-4 mm) Anterolisthesis of C4 on C5; Cervical foraminal stenosis (Bilateral); Spondylosis without myelopathy or radiculopathy, lumbosacral region; Chronic shoulders pain (Bilateral) (L>R); Osteoarthritis of shoulder (Bilateral); Osteoarthritis of hip (Bilateral); Chronic shoulder pain after shoulder replacement (Bilateral) (L>R); Trigger finger, left middle finger; Cervicalgia; Greater trochanteric bursitis of hip (Left); Other specified dorsopathies, sacral and sacrococcygeal region; Chronic hip pain (Left); Osteoarthritis of hip (Left); Abnormal MRI, lumbar spine (11/03/2019); Chronic sacroiliac pain (Bilateral); Osteoarthritis of sacroiliac joints (Bilateral) (HCC); and Chronic low back pain (Bilateral) w/o sciatica on their pertinent problem list. Pain Assessment: Severity of Chronic pain is reported as a 5 /10. Location: Back Left/pain stay in the hip area. Onset: More than a month ago. Quality: Aching. Timing: Constant. Modifying factor(s): laying down and meds at times. Vitals:  height is '5\' 7"'  (1.702 m) and weight is 150 lb (68 kg). Her temperature is 97.5 F (36.4 C) (abnormal). Her blood pressure is 105/62 and her pulse is 64. Her oxygen saturation is 93%.   Reason for encounter: medication management.  According to our records, the patient was taking oxycodone IR 20 mg, 1 tab p.o. every 8 hours (60 mg/day of oxycodone) (90 MME/day).  Due to CDC recommendations, decision was made to begin tapering down this patient's medications through a "Drug Holiday" for the purpose of eliminating her tolerance and hopefully getting her into a safer lower dose with MME below 60.  The patient's last oxycodone IR 20 mg prescription was written on 06/04/2020 and filled on 08/25/2020.   Prior to that, the patient's PMP had demonstrated excellent compliance and the patient's partner by getting her prescriptions filled every 30 days, suggesting that the patient was using  the medications as prescribed.  In addition, follow-up visits for medication management were kept with no problems and the patient had been indicating that she was compliant with her medications and taking them as prescribed.  On 09/17/2020 the decision was made to proceed with the "Drug Holiday" and to begin changing her prescriptions so as to slowly decrease her daily dose, so as to avoid withdrawals.  The plan was to decrease her daily dose by 5 mg every 7 days.  On 09/17/2020 she was provided with a prescription for the oxycodone IR 10 mg to be taken 4 times daily (40 mg/day of oxycodone) + a prescription for oxycodone IR 5 mg, to be taken 1 tablet p.o. 4 times daily (20 mg/day of oxycodone), (total: 60 mg/day of oxycodone).  These 5 mg pills would be decreased every 7 days until completely eliminated.  At that point, the 10 mg pills would be eliminated and again replaced by 5 mg pills that would continue to decrease in # every 7 days until the patient was completely off of the oxycodone.  On 09/17/2020 I also scheduled the patient to come in for a bilateral lumbar facet and SI joint block for the purpose of decreasing her pain and making this opioid transition smoother.  On 09/30/2020 the patient came in for the scheduled procedure and at that time she indicated that she had gone to the pharmacy and obtained the oxycodone 10 mg pills, but she seemed to be confused on how she should be taking them and whether or not would be equivalent to what she was taking before because of this I went ahead and reviewed the PMP and noticed that the patient had not received the prescription for the oxycodone 5 mg pills.  At the time I became concerned that she may going through withdrawals from having gone down from her 60 mg/day of oxycodone to  only 40 mg/day.  We contacted the pharmacy and we also contacted the patient's daughter to make sure that we were all on the same page.  To my surprise, the patient's daughter indicated that she has actually not been taking the oxycodone IR 20 mg pills 3 times a day.  In fact, she indicated that she normally will take the medicine twice a day and this apparently makes her very sleepy and she does not wake up in the middle of the night to take the third pill.  She also indicated that because of this, the patient has a surplus of pills at home.  This is a problem because the patient had not been bring him back her bottles or pills to be counted, claiming that she had none left.  This apparently was not true.  Being this the case, this means that she was actually taking oxycodone 40 mg/day and not 60 mg/day as she had reported.  It was actually good to the patient did not get her oxycodone 5 mg pills filled since this would have been an increase in what she was taking as opposed to really matching her dose.  Because of this, we spoke to the patient's daughter on the phone and requested that she bring in all of the surplus medications that she has at home so that they can be counted and destroyed in front of witnesses.  In addition, I had my nurse call the pharmacy and cancel all of the other prescriptions that we had written for this patient.  The purpose of today's visit is to get a true  account of what the patient is actually taking and to then go down on the dose until we can accomplish a "Drug Holiday".  Once we do this, we will reassess the patient as needed for these medications since having too much medication at home it is a significant risk.  This is highlighted by the fact that the patient's daughter indicated that there was an event where she had forgotten taking her pain medication pill and took a second 1, causing the patient to become oversedated and unresponsive.  They took her to the emergency room  where she was observed, but not admitted to the hospital.  It was determined that the event had been an apparent unintentional overdose, which the patient of course never reported to [name].  Lately I have also seen the patient be dependent on oxygen and therefore it is imperative that we bring down her opioids to a much safer dose, if not completely eliminate them.  After having talked to the daughter today, she clarified that they had already started tapering her down and she just started a regimen where she is taking 30 mg/day.  She believes that this is sufficient and it is helping Ms. Touchet with her pain as she is not really suffering.  Because of this, this is where we will be starting our taper.  For the next week she will be at 30 mg/day, followed by a decrease of 5 mg every week.  Therefore the next prescription will be for 25 mg/day, followed by 20 mg/day 7 days later, and so 1 and so forth.  Today we took the session of #17, 20 mg pills, which we destroyed in front of the patient and her daughter.  In addition to that we also took pulsation of #117, 10 mg oxycodone pills, which we also destroyed in front of the patient and her daughter.  A new set of prescriptions is being sent to the pharmacy today.  RTCB: 11/27/2020 Nonopioids transferred 06/04/2020: Requip, MiraLAX, Dulcolax, and melatonin  Pharmacotherapy Assessment   Analgesic: Oxycodone IR 20 mg, 1 tab PO q 8 hrs (60 mg/day of oxycodone) MME/day: 90 mg/day.   Monitoring: [place] PMP: PDMP reviewed during this encounter.       Pharmacotherapy: No side-effects or adverse reactions reported. Compliance: No problems identified. Effectiveness: Clinically acceptable.  Hart Rochester, RN  10/02/2020  3:25 PM  Sign when Signing Visit 1435 #17 oxycodone 41m wasted in stericycle with SSaul FordyceRN and Erika(patient's daughter) as witness.   1510 #117 Oxycodone 10 mg wasted in stericycle with patient's daughter as witness, Dena and  Cyndi.  [place] all scripts for ox[place]one sent prior to today were deleted by pharmacist.  BChauncey Fischer RN  10/02/2020  2:33 PM  Sign when Signing Visit Nursing Pain Medication Assessment:  Safety precautions to be maintained throughout the outpatient stay will include: orient to surroundings, keep bed in low position, maintain call bell within reach at all times, provide assistance with transfer out of bed and ambulation.  Medication Inspection Compliance: Pill count conducted under aseptic conditions, in front of the patient. Neither the pills nor the bottle was removed from the patient's sight at any time. Once count was completed pills were immediately returned to the patient in their original bottle.  Medication #1: Oxycodone IR 25mPill/Patch Count: 17 of 90 pills remain Pill/Patch Appearance: Markings consistent with prescribed medication Bottle Appearance: Standard pharmacy container. Clearly labeled. Filled Date: 1 / 3 / 22 Last  Medication intake:  not taken  Medication #2: Oxycodone IR 6m Pill/Patch Count: 117 of 120 pills remain Pill/Patch Appearance: Markings consistent with prescribed medication Bottle Appearance: Standard pharmacy container. Clearly labeled. Filled Date: 2 / 2 / 22 Last Medication intake:  TodaySafety precautions to be maintained throughout the outpatient stay will include: orient to surroundings, keep bed in low position, maintain call bell within reach at all times, provide assistance with transfer out of bed and ambulation.     UDS:  Summary  Date Value Ref Range Status  02/19/2020 Note  Final    Comment:    ==================================================================== [employer] (MW) ==================================================================== Test                             Result       Flag       Units  Drug Present and Declared for Prescription Verification   Oxycodone                      4108          EXPECTED   ng/mg creat   Oxymorphone                    1875         EXPECTED   ng/mg creat   Noroxycodone                   >7353        EXPECTED   ng/mg creat   Noroxymorphone                 835          EXPECTED   ng/mg creat    Sources of oxycodone are scheduled prescription medications.    Oxymorphone, noroxycodone, and noroxymorphone are expected    metabolites of oxycodone. Oxymorphone is also available as a    scheduled prescription medication.  ==================================================================== Test                      Result    Flag   Units      Ref Range   Creatinine              136              mg/dL      >=20 ==================================================================== Declared Medications:  The flagging and interpretation on this report are based on the  following declared medications.  Unexpected results may arise from  inaccuracies in the declared medications.   **Note: The testing scope of this panel includes these medications:   Oxycodone   **Note: The testing scope of this panel does not include the  following reported medications:   Acetaminophen (Tylenol)  Albuterol  Apixaban (Eliquis)  Bisacodyl  Calcium  Furosemide (Lasix)  Helium  Levocetirizine (Xyzal)  Lisinopril (Zestril)  Melatonin  Oxygen  Polyethylene Glycol (MiraLAX)  Ropinirole (Requip)  Supplement  Vitamin D ==================================================================== For clinical consultation, please call ([phone] ====================================================================      ROS  Constitutional: Denies any fever or chills Gastrointestinal: No reported hemesis, hematochezia, vomiting, or acute GI distress Musculoskeletal: Denies any acute onset joint swelling, redness, loss of ROM, or weakness Neurological: No reported episodes of acute onset apraxia, aphasia, dysarthria, agnosia, amnesia, paralysis, loss of coordination, or  loss of consciousness  Medication Review  Benefiber, Melatonin, Oxygen-Helium, Vitamin D, acetaminophen, albuterol, apixaban, aspirin  EC, atorvastatin, bisacodyl, dextromethorphan-guaiFENesin, diclofenac Sodium, furosemide, levocetirizine, mupirocin ointment, oxyCODONE, polyethylene glycol, rOPINIRole, and solifenacin  History Review  Allergy: Ms. Ballinas is allergic to flexeril [cyclobenzaprine hcl] and vancomycin. Drug: Ms. Gift  reports current drug use. Drug: Oxycodone. Alcohol:  reports no history of alcohol use. Tobacco:  reports that she quit smoking about 21 years ago. Her smoking use included cigarettes. She has a 50.00 pack-year smoking history. She has quit using smokeless tobacco. Social: Ms. Marquard  reports that she quit smoking about 21 years ago. Her smoking use included cigarettes. She has a 50.00 pack-year smoking history. She has quit using smokeless tobacco. She reports current drug use. Drug: Oxycodone. She reports that she does not drink alcohol. Medical:  has a past medical history of Acute postoperative pain (02/09/2018), Arrhythmia, sinus node (04/03/2014), Arthritis, Arthritis, Atrial fibrillation ([place]), Back pain, Bradycardia, Breast cancer ([place]) (2004), Cancer ([place]) (2004), CHF (congestive heart failure) ([place]), Chronic back pain, CKD (chronic kidney disease), COPD (chronic obstructive pulmonary disease) ([place]), Cystocele, Decubitus ulcers, Dehydration, Gout, Hematuria, Hepatitis C, Hyperlipidemia, Hypertension, Hypomagnesemia (06/25/2015), OAB (overactive bladder), Overactive bladder, Restless leg, Shoulder pain, bilateral, Urinary frequency, UTI (lower urinary tract infection), and Vaginal atrophy. Surgical: Ms. Cong  has a past surgical history that includes Abdominal hysterectomy; Back surgery; Rotator cuff repair; Breast surgery; Cataract extraction w/PHACO (10/19/2011); Cataract extraction w/PHACO (11/02/2011); Carpal tunnel release; Joint replacement; Colon surgery;  Pacemaker insertion (N/A, 12/24/2015); Cholecystectomy; Breast lumpectomy (Right, 2004); Insert / replace / remove pacemaker; and Lower Extremity Angiography (Left, 10/14/2020). Family: family history includes Breast cancer in her paternal aunt; Breast cancer (age of onset: 57) in her sister; Cancer in her sister; Heart disease in her father and mother; Kidney disease in her brother; Stroke in her mother.  Laboratory Chemistry Profile   Renal Lab Results  Component Value Date   BUN 31 (H) 11/07/2020   CREATININE 1.43 (H) 11/07/2020   BCR 29 (H) 10/02/2020   GFRAA 52 (L) 10/02/2020   GFRNONAA 35 (L) 11/07/2020     Hepatic Lab Results  Component Value Date   AST 21 11/07/2020   ALT 16 11/07/2020   ALBUMIN 3.7 11/07/2020   ALKPHOS 55 11/07/2020     Electrolytes Lab Results  Component Value Date   NA 134 (L) 11/07/2020   K 4.2 11/07/2020   CL 108 11/07/2020   CALCIUM 9.0 11/07/2020   MG 2.0 11/07/2020   PHOS 3.0 08/05/2020     Bone Lab Results  Component Value Date   VD25OH 31 04/01/2020     Inflammation (CRP: Acute Phase) (ESR: Chronic Phase) Lab Results  Component Value Date   CRP <0.5 09/24/2015   ESRSEDRATE 12 09/24/2015   LATICACIDVEN 1.8 08/02/2020       Note: Above Lab results reviewed.  Recent Imaging Review  ABI WITH/WO TBI LOWER EXTREMITY DOPPLER STUDY   Comparison Study: 10/14/2020  Performing Technologist: Charlane Ferretti RT (R)(VS)    Examination Guidelines: A complete evaluation includes at minimum, Doppler waveform signals and systolic blood pressure reading at the level of bilateral brachial, anterior tibial, and posterior tibial arteries, when vessel segments are accessible. Bilateral testing is considered an integral part of a complete examination. Photoelectric Plethysmograph (PPG) waveforms and toe systolic pressure readings are included as required and additional duplex testing as needed. Limited examinations for reoccurring indications may  be performed as noted.    ABI Findings: +---------+------------------+-----+----------+--------+ Right    Rt Pressure (mmHg)IndexWaveform  Comment  +---------+------------------+-----+----------+--------+ Brachial 122                                       +---------+------------------+-----+----------+--------+  ATA      101               0.83 monophasic         +---------+------------------+-----+----------+--------+ PTA      99                0.81 monophasic         +---------+------------------+-----+----------+--------+ Great Toe45                0.37 Abnormal           +---------+------------------+-----+----------+--------+  +---------+------------------+-----+----------+-------+ Left     Lt Pressure (mmHg)IndexWaveform  Comment +---------+------------------+-----+----------+-------+ Brachial 112                                      +---------+------------------+-----+----------+-------+ ATA      99                0.81 monophasic        +---------+------------------+-----+----------+-------+ PTA      98                0.80 monophasic        +---------+------------------+-----+----------+-------+ Great Toe75                0.61 Abnormal          +---------+------------------+-----+----------+-------+  +-------+-----------+-----------+------------+------------+ ABI/TBIToday's ABIToday's TBIPrevious ABIPrevious TBI +-------+-----------+-----------+------------+------------+ Right  .83        .37        .82         .60          +-------+-----------+-----------+------------+------------+ Left   .81        .61        .96         .61          +-------+-----------+-----------+------------+------------+  Right ABIs appear essentially unchanged compared to prior study on 10/14/2020. Left ABIs appear decreased compared to prior study on 10/14/2020. Bilateral TBI's appear essentially unchanged as compared to the  previous exam on 10/14/2020.   Summary: Right: Resting right ankle-brachial index indicates mild right lower extremity arterial disease. The right toe-brachial index is abnormal.  Left: Resting left ankle-brachial index indicates mild left lower extremity arterial disease. The left toe-brachial index is abnormal.  *See table(s) above for measurements and observations.    Electronically signed by Hortencia Pilar MD on 11/10/2020 at 5:16:23 PM.    Final   VAS [name] LOWER EXTREMITY ARTERIAL DUPLEX LOWER EXTREMITY ARTERIAL DUPLEX STUDY   Vascular Interventions: 10/14/2020: PTA of the Left SFA and Popliteal Artery.                         PTA of the Left Posterior tibial Artery and                         Tibioperoneal trunk. PTA and Stent placement Left EIA. Current ABI:            Not obtained  Performing Technologist: Almira Coaster RVS    Examination Guidelines: A complete evaluation includes B-mode imaging, spectral Doppler, color Doppler, and power Doppler as needed of all accessible portions of each vessel. Bilateral testing is considered an integral part of a complete examination. Limited examinations for reoccurring indications may be performed as noted.     +-----------+--------+-----+--------+--------+--------+ RIGHT      PSV cm/sRatioStenosisWaveformComments +-----------+--------+-----+--------+--------+--------+  ATA Distal 22                                    +-----------+--------+-----+--------+--------+--------+ PTA Distal 126                                   +-----------+--------+-----+--------+--------+--------+ PERO Distal10                                    +-----------+--------+-----+--------+--------+--------+     +-----------+--------+-----+--------+----------+------------+ LEFT       PSV cm/sRatioStenosisWaveform  Comments     +-----------+--------+-----+--------+----------+------------+ CFA Distal 84                    triphasic              +-----------+--------+-----+--------+----------+------------+ DFA        54                   triphasic              +-----------+--------+-----+--------+----------+------------+ SFA Prox   96                   triphasic              +-----------+--------+-----+--------+----------+------------+ SFA Mid    91                   triphasic patent stent +-----------+--------+-----+--------+----------+------------+ SFA Distal 64                   triphasic              +-----------+--------+-----+--------+----------+------------+ POP Distal 44                   triphasic              +-----------+--------+-----+--------+----------+------------+ ATA Distal 22                   monophasic             +-----------+--------+-----+--------+----------+------------+ PTA Distal 126                  triphasic              +-----------+--------+-----+--------+----------+------------+ PERO Distal10                   monophasic             +-----------+--------+-----+--------+----------+------------+     Summary: Left: Imaging and Waveforms obtained throughout in the Left Lower Extremity; The Left leg stent appears to be patent.    See table(s) above for measurements and observations.  Electronically signed by Hortencia Pilar MD on 11/10/2020 at 5:16:00 PM.       Final   Note: Reviewed        Physical Exam  General appearance: Well nourished, well developed, and well hydrated. In no apparent acute distress Mental status: Alert, oriented x 3 (person, place, & time)       Respiratory: No evidence of acute respiratory distress Eyes: PERLA Vitals: BP 105/62   Pulse 64   Temp (!) 97.5 F (36.4 C)   Ht '5\' 7"'  (1.702 m)   Wt 150 lb (68 kg)   SpO2 93%   BMI 23.49  kg/m  BMI: Estimated body mass index is 23.49 kg/m as calculated from the following:   Height as of this encounter: '5\' 7"'  (1.702 m).   Weight as of  this encounter: 150 lb (68 kg). Ideal: Ideal body weight: 61.6 kg (135 lb 12.9 oz) Adjusted ideal body weight: 64.2 kg (141 lb 7.7 oz)  Assessment   Status Diagnosis  Controlled Improving Improving 1. Chronic pain syndrome   2. Chronic low back pain (1ry area of Pain) (Bilateral) (L>R) w/ sciatica (Bilateral)   3. Chronic lower extremity pain (2ry area of Pain) (Bilateral) (L>R)   4. Chronic cervical radicular pain (3ry area of Pain) (Bilateral) (L>R)   5. Long term prescription opiate use   6. Opioid dependence with opioid-induced disorder ([place])   7. Pharmacologic therapy   8. Stage 4 chronic kidney disease ([place])   9. Overdose opiate, accidental or unintentional, sequela   10. Encounter for chronic pain management   11. Encounter for pain management counseling      Updated Problems: No problems updated.  Plan of Care  Problem-specific:  No problem-specific Assessment & Plan notes found for this encounter.  Ms. MOLLYE GUINTA has a current medication list which includes the following long-term medication(s): apixaban, bisacodyl, furosemide, levocetirizine, polyethylene glycol, ropinirole, albuterol, atorvastatin, oxycodone, and benefiber.  Pharmacotherapy (Medications Ordered): Meds ordered this encounter  Medications  . DISCONTD: oxyCODONE (OXY IR/ROXICODONE) 5 MG immediate release tablet    Sig: Take 1 tablet (5 mg total) by mouth daily for 7 days. Max: 1/day. Must last 7 days.    Dispense:  7 tablet    Refill:  0    Prescription is part of a downward taper. Fill prescriptions in the correct order. Failure to do so may trigger withdrawal syndrome. Fill date: 11/06/2020. To last until: 11/13/2020.  Marland Kitchen DISCONTD: oxyCODONE (OXY IR/ROXICODONE) 5 MG immediate release tablet    Sig: Take 1 tablet (5 mg total) by mouth 2 (two) times daily for 7 days. Max: 2/day. Must last 7 days.    Dispense:  14 tablet    Refill:  0    Prescription is part of a downward taper. Fill prescriptions in  the correct order. Failure to do so may trigger withdrawal syndrome. Fill date: 10/30/2020. To last until: 11/06/2020.  Marland Kitchen DISCONTD: oxyCODONE (OXY IR/ROXICODONE) 5 MG immediate release tablet    Sig: Take 1 tablet (5 mg total) by mouth 3 (three) times daily for 7 days. Max: 3/day. Must last 7 days.    Dispense:  21 tablet    Refill:  0    Prescription is part of a downward taper. Fill prescriptions in the correct order. Failure to do so may trigger withdrawal syndrome. Fill date: 10/23/2020. To last until: 10/30/2020.  Marland Kitchen DISCONTD: oxyCODONE (OXY IR/ROXICODONE) 5 MG immediate release tablet    Sig: Take 1 tablet (5 mg total) by mouth 4 (four) times daily for 7 days. Max: 4/day. Must last 7 days.    Dispense:  28 tablet    Refill:  0    Prescription is part of a downward taper. Fill prescriptions in the correct order. Failure to do so may trigger withdrawal syndrome. Fill date: 10/16/2020. To last until: 10/23/2020.  Marland Kitchen DISCONTD: oxyCODONE (OXY IR/ROXICODONE) 5 MG immediate release tablet    Sig: Take 1 tablet (5 mg total) by mouth 5 (five) times daily for 7 days. Max: 5/day. Must last 7 days.    Dispense:  35 tablet    Refill:  0    Prescription is part of a downward taper. Fill prescriptions in the correct order. Failure to do so may trigger withdrawal syndrome. Fill date: 10/09/2020. To last until: 10/16/2020.  Marland Kitchen DISCONTD: oxyCODONE (OXY IR/ROXICODONE) 5 MG immediate release tablet    Sig: Take 1 tablet (5 mg total) by mouth 6 (six) times daily for 7 days. Max: 6/day. Must last 7 days.    Dispense:  42 tablet    Refill:  0    Prescription is part of a downward taper. Fill prescriptions in the correct order. Failure to do so may trigger withdrawal syndrome. Fill date: 10/02/2020. To last until: 10/09/2020.   Orders:  No orders of the defined types were placed in this encounter.  Follow-up plan:   Return in about 8 weeks (around 11/27/2020) for (F2F), (Med Mgmt).      Interventional Therapies  Risk   Complexity Considerations:   NOTE: ELIQUIS ANTICOAGULATION (Stop: x3 days prior to procedure  Restart 6 hours after procedures)   Planned  Pending:   Pending further evaluation   Under consideration:   Diagnostic right SI joint block Possible bilateral SI joint RFA Diagnostic bilateral femoral nerve + obturator NB Possible bilateral femoral nerve + obturator nerve RFA Diagnostic right IA shoulder joint injection   Completed:   Diagnostic/therapeutic right L2 TFESI x1 (01/31/2020)  Diagnostic/therapeutic left L4 TFESI x1 (01/31/2020) Diagnostic left SI joint block x2 (09/27/2019) Palliative left CESI x2 (10/13/2017) Palliative left trochanteric bursa injection x1 (03/27/2019)  Palliative right IA hip inj. x1  (100% on the right) (02/08/2019) Palliative left IA hip inj. x2 (02/08/2019) Palliative right lumbar facet MBB x2(01/10/2020) Palliative left lumbar facet MBB x4(01/10/2020) Palliative right lumbar facet RFA x1 (09/19/18) Palliative left lumbar facet RFA x3 (08/01/18) Diagnostic/therapeutic right suprascapular NB x3 (05/10/2017)  Palliative bilateral suprascapular nerve RFA x1(07/13/18)   Therapeutic  Palliative (PRN) options:   Diagnostic left SI joint block #2  Palliative left CESI #3  Palliative left trochanteric bursa injection #2  Palliative right IA hip inj. #2  (100% on the right) Palliative left IA hip inj. #3   Palliative right lumbar facet block #3 Palliative left lumbar facet block #5 Palliative right lumbar facet RFA #2  Palliative left lumbar facet RFA #4  Palliative bilateral suprascapular nerve block Palliative bilateral suprascapular nerve RFA #2    Recent Visits Date Type Provider Dept  11/24/20 Office Visit Milinda Pointer, MD [facility]  10/20/20 Office Visit Milinda Pointer, MD [facility]  10/02/20 Office Visit Milinda Pointer, MD [facility]  09/30/20 Procedure visit Milinda Pointer, MD [facility]  09/17/20 Office Visit Milinda Pointer, MD [facility]  Showing recent visits within past 90 days and meeting all other requirements Future Appointments Date Type Provider Dept  01/07/21 Appointment Milinda Pointer, MD [facility]  Showing future appointments within next 90 days and meeting all other requirements  I discussed the assessment and treatment plan with the patient. The patient was provided an opportunity to ask questions and all were answered. The patient agreed with the plan and demonstrated an understanding of the instructions.  Patient advised to call back or seek an in-person evaluation if the symptoms or condition worsens.  Duration of encounter: 70 minutes.  Note by: Gaspar Cola, MD Date: 10/02/2020; Time: 5:49 AM

## 2020-10-02 NOTE — Progress Notes (Signed)
1435 #17 oxycodone 20mg  wasted in stericycle with Saul Fordyce RN and Erika(patient's daughter) as witness.   1510 #117 Oxycodone 10 mg wasted in stericycle with patient's daughter as witness, Dena and Cyndi.  1515 Pharmacy called and all scripts for oxycodone sent prior to today were deleted by pharmacist.

## 2020-10-02 NOTE — Assessment & Plan Note (Signed)
Well-controlled HTN - Home BP readings reviewed  Complication with CKD III, dCHF    Plan:  1. Continue current BP regimen (RESTART) Lisinopril 10mg  daily, Furosemide up to 20-40mg  daily PRN 2. Encourage improved lifestyle - low sodium diet, regular exercise 3. Continue monitor BP outside office, bring readings to next visit, if persistently >140/90 or new symptoms notify office sooner

## 2020-10-02 NOTE — Telephone Encounter (Signed)
Spoke with the patient and she is scheduled with Dr. Delana Meyer for a LLE angio on 10/14/20 with a 9:00 am arrival time to the [facility]. Covid testing is on 10/10/20 between 8-1 pm at the [facility]. Pre-procedure instructions were discussed and will be mailed.

## 2020-10-02 NOTE — Assessment & Plan Note (Signed)
Stable without acute COPD exacerbation Former smoker >30 years ago Not on maintenance therapy Has nebulizer at home w/ albuterol

## 2020-10-02 NOTE — Assessment & Plan Note (Signed)
Stable without acute exacerbation dCHF with preserved EF Followed by Dr Saralyn Pilar [facility] On furosemide diuretic

## 2020-10-02 NOTE — Progress Notes (Signed)
Nursing Pain Medication Assessment:  Safety precautions to be maintained throughout the outpatient stay will include: orient to surroundings, keep bed in low position, maintain call bell within reach at all times, provide assistance with transfer out of bed and ambulation.  Medication Inspection Compliance: Pill count conducted under aseptic conditions, in front of the patient. Neither the pills nor the bottle was removed from the patient's sight at any time. Once count was completed pills were immediately returned to the patient in their original bottle.  Medication #1: Oxycodone IR 20mg  Pill/Patch Count: 17 of 90 pills remain Pill/Patch Appearance: Markings consistent with prescribed medication Bottle Appearance: Standard pharmacy container. Clearly labeled. Filled Date: 1 / 3 / 22 Last Medication intake:  not taken  Medication #2: Oxycodone IR 10mg  Pill/Patch Count: 117 of 120 pills remain Pill/Patch Appearance: Markings consistent with prescribed medication Bottle Appearance: Standard pharmacy container. Clearly labeled. Filled Date: 2 / 2 / 22 Last Medication intake:  TodaySafety precautions to be maintained throughout the outpatient stay will include: orient to surroundings, keep bed in low position, maintain call bell within reach at all times, provide assistance with transfer out of bed and ambulation.

## 2020-10-02 NOTE — Assessment & Plan Note (Signed)
See A&P for HTN w CKD  Check BMET today for Creatinine trend. Prior elevated during hospitalization.

## 2020-10-02 NOTE — Patient Instructions (Addendum)
Thank you for coming to the office today.  Blood test today for kidney function  Restart Lisinopril 10mg   Keep track of BP  Improve hydration.  Keep on breathing treatments.  DUE for FASTING BLOOD WORK (no food or drink after midnight before the lab appointment, only water or coffee without cream/sugar on the morning of)  SCHEDULE "Lab Only" visit in the morning at the clinic for lab draw in 6 MONTHS   - Make sure Lab Only appointment is at about 1 week before your next appointment, so that results will be available  For Lab Results, once available within 2-3 days of blood draw, you can can log in to MyChart online to view your results and a brief explanation. Also, we can discuss results at next follow-up visit.   Please schedule a Follow-up Appointment to: Return in about 6 months (around 04/01/2021) for 6 month Annual Physical (AM apt, fasting lab AFTER).  If you have any other questions or concerns, please feel free to call the office or send a message through [place]. You may also schedule an earlier appointment if necessary.  Additionally, you may be receiving a survey about your experience at our office within a few days to 1 week by e-mail or mail. We value your feedback.  [name], DO [place]

## 2020-10-02 NOTE — Patient Instructions (Addendum)
____________________________________________________________________________________________  Drug Holidays (Slow)  What is a "Drug Holiday"? Drug Holiday: is the name given to the period of time during which a patient stops taking a medication(s) for the purpose of eliminating tolerance to the drug.  Benefits . Improved effectiveness of opioids. . Decreased opioid dose needed to achieve benefits. . Improved pain with lesser dose.  What is tolerance? Tolerance: is the progressive decreased in effectiveness of a drug due to its repetitive use. With repetitive use, the body gets use to the medication and as a consequence, it loses its effectiveness. This is a common problem seen with opioid pain medications. As a result, a larger dose of the drug is needed to achieve the same effect that used to be obtained with a smaller dose.  How long should a "Drug Holiday" last? You should stay off of the pain medicine for at least 14 consecutive days. (2 weeks)  Should I stop the medicine "cold turkey"? No. You should always coordinate with your Pain Specialist so that he/she can provide you with the correct medication dose to make the transition as smoothly as possible.  How do I stop the medicine? Slowly. You will be instructed to decrease the daily amount of pills that you take by one (1) pill every seven (7) days. This is called a "slow downward taper" of your dose. For example: if you normally take four (4) pills per day, you will be asked to drop this dose to three (3) pills per day for seven (7) days, then to two (2) pills per day for seven (7) days, then to one (1) per day for seven (7) days, and at the end of those last seven (7) days, this is when the "Drug Holiday" would start.   Will I have withdrawals? By doing a "slow downward taper" like this one, it is unlikely that you will experience any significant withdrawal symptoms. Typically, what triggers withdrawals is the sudden stop of a high  dose opioid therapy. Withdrawals can usually be avoided by slowly decreasing the dose over a prolonged period of time. If you do not follow these instructions and decide to stop your medication abruptly, withdrawals may be possible.  What are withdrawals? Withdrawals: refers to the wide range of symptoms that occur after stopping or dramatically reducing opiate drugs after heavy and prolonged use. Withdrawal symptoms do not occur to patients that use low dose opioids, or those who take the medication sporadically. Contrary to benzodiazepine (example: Valium, Xanax, etc.) or alcohol withdrawals ("Delirium Tremens"), opioid withdrawals are not lethal. Withdrawals are the physical manifestation of the body getting rid of the excess receptors.  Expected Symptoms Early symptoms of withdrawal may include: . Agitation . Anxiety . Muscle aches . Increased tearing . Insomnia . Runny nose . Sweating . Yawning  Late symptoms of withdrawal may include: . Abdominal cramping . Diarrhea . Dilated pupils . Goose bumps . Nausea . Vomiting  Will I experience withdrawals? Due to the slow nature of the taper, it is very unlikely that you will experience any.  What is a slow taper? Taper: refers to the gradual decrease in dose.  (Last update: 03/12/2020) ____________________________________________________________________________________________     

## 2020-10-03 LAB — BASIC METABOLIC PANEL WITH GFR
BUN/Creatinine Ratio: 29 (calc) — ABNORMAL HIGH (ref 6–22)
BUN: 32 mg/dL — ABNORMAL HIGH (ref 7–25)
CO2: 26 mmol/L (ref 20–32)
Calcium: 10.5 mg/dL — ABNORMAL HIGH (ref 8.6–10.4)
Chloride: 104 mmol/L (ref 98–110)
Creat: 1.09 mg/dL — ABNORMAL HIGH (ref 0.60–0.88)
GFR, Est African American: 52 mL/min/{1.73_m2} — ABNORMAL LOW (ref >=60)
GFR, Est Non African American: 45 mL/min/{1.73_m2} — ABNORMAL LOW (ref >=60)
Glucose, Bld: 96 mg/dL (ref 65–99)
Potassium: 4.1 mmol/L (ref 3.5–5.3)
Sodium: 140 mmol/L (ref 135–146)

## 2020-10-10 ENCOUNTER — Other Ambulatory Visit
Admission: RE | Admit: 2020-10-10 | Discharge: 2020-10-10 | Disposition: A | Discharge status: Home / Self Care | Payer: Medicare HMO | Source: Ambulatory Visit | Attending: Vascular Surgery | Admitting: Vascular Surgery

## 2020-10-10 ENCOUNTER — Other Ambulatory Visit: Payer: Self-pay

## 2020-10-10 DIAGNOSIS — Z20822 Contact with and (suspected) exposure to covid-19: Secondary | ICD-10-CM | POA: Insufficient documentation

## 2020-10-10 DIAGNOSIS — Z01812 Encounter for preprocedural laboratory examination: Secondary | ICD-10-CM | POA: Insufficient documentation

## 2020-10-10 LAB — SARS CORONAVIRUS 2 (TAT 6-24 HRS): SARS Coronavirus 2: NEGATIVE

## 2020-10-11 DIAGNOSIS — J45998 Other asthma: Secondary | ICD-10-CM | POA: Diagnosis not present

## 2020-10-13 ENCOUNTER — Other Ambulatory Visit (INDEPENDENT_AMBULATORY_CARE_PROVIDER_SITE_OTHER): Payer: Self-pay | Admitting: Nurse Practitioner

## 2020-10-13 DIAGNOSIS — J45998 Other asthma: Secondary | ICD-10-CM | POA: Diagnosis not present

## 2020-10-14 ENCOUNTER — Encounter: Payer: Self-pay | Admitting: Vascular Surgery

## 2020-10-14 ENCOUNTER — Encounter: Payer: Self-pay | Admitting: Certified Registered"

## 2020-10-14 ENCOUNTER — Encounter
Admission: RE | Disposition: A | Discharge status: Home / Self Care | Payer: Self-pay | Source: Home / Self Care | Attending: Vascular Surgery

## 2020-10-14 ENCOUNTER — Ambulatory Visit
Admission: RE | Admit: 2020-10-14 | Discharge: 2020-10-14 | Disposition: A | Discharge status: Home / Self Care | Payer: Medicare HMO | Attending: Vascular Surgery | Admitting: Vascular Surgery

## 2020-10-14 ENCOUNTER — Other Ambulatory Visit: Payer: Self-pay

## 2020-10-14 DIAGNOSIS — I70249 Atherosclerosis of native arteries of left leg with ulceration of unspecified site: Secondary | ICD-10-CM | POA: Diagnosis not present

## 2020-10-14 DIAGNOSIS — I70243 Atherosclerosis of native arteries of left leg with ulceration of ankle: Secondary | ICD-10-CM | POA: Insufficient documentation

## 2020-10-14 DIAGNOSIS — Z79899 Other long term (current) drug therapy: Secondary | ICD-10-CM | POA: Diagnosis not present

## 2020-10-14 DIAGNOSIS — Z87891 Personal history of nicotine dependence: Secondary | ICD-10-CM | POA: Diagnosis not present

## 2020-10-14 DIAGNOSIS — Z7901 Long term (current) use of anticoagulants: Secondary | ICD-10-CM | POA: Diagnosis not present

## 2020-10-14 DIAGNOSIS — L97321 Non-pressure chronic ulcer of left ankle limited to breakdown of skin: Secondary | ICD-10-CM | POA: Diagnosis not present

## 2020-10-14 DIAGNOSIS — M5030 Other cervical disc degeneration, unspecified cervical region: Secondary | ICD-10-CM | POA: Diagnosis not present

## 2020-10-14 DIAGNOSIS — I70299 Other atherosclerosis of native arteries of extremities, unspecified extremity: Secondary | ICD-10-CM

## 2020-10-14 HISTORY — PX: LOWER EXTREMITY ANGIOGRAPHY: CATH118251

## 2020-10-14 LAB — CREATININE, SERUM
Creatinine, Ser: 1.29 mg/dL — ABNORMAL HIGH (ref 0.44–1.00)
GFR, Estimated: 40 mL/min — ABNORMAL LOW (ref >60)

## 2020-10-14 LAB — BUN: BUN: 37 mg/dL — ABNORMAL HIGH (ref 8–23)

## 2020-10-14 SURGERY — LOWER EXTREMITY ANGIOGRAPHY
Anesthesia: Moderate Sedation | Site: Leg Lower | Laterality: Left

## 2020-10-14 MED ORDER — MIDAZOLAM HCL 5 MG/5ML IJ SOLN
INTRAMUSCULAR | Status: AC
  Filled 2020-10-14: qty 5

## 2020-10-14 MED ORDER — NITROGLYCERIN 1 MG/10 ML FOR IR/CATH LAB
INTRA_ARTERIAL | Status: AC
  Filled 2020-10-14: qty 10

## 2020-10-14 MED ORDER — DIPHENHYDRAMINE HCL 50 MG/ML IJ SOLN: 50.0000 mg | Freq: Once | INTRAMUSCULAR | Status: DC | PRN

## 2020-10-14 MED ORDER — SODIUM CHLORIDE 0.9 % IV SOLN: INTRAVENOUS | Status: DC

## 2020-10-14 MED ORDER — LABETALOL HCL 5 MG/ML IV SOLN: 10.0000 mg | INTRAVENOUS | Status: DC | PRN

## 2020-10-14 MED ORDER — SODIUM CHLORIDE 0.9% FLUSH: 3.0000 mL | Freq: Two times a day (BID) | INTRAVENOUS | Status: DC

## 2020-10-14 MED ORDER — HEPARIN SODIUM (PORCINE) 1000 UNIT/ML IJ SOLN
INTRAMUSCULAR | Status: AC
  Filled 2020-10-14: qty 1

## 2020-10-14 MED ORDER — HEPARIN SODIUM (PORCINE) 1000 UNIT/ML IJ SOLN
INTRAMUSCULAR | Status: DC | PRN
  Administered 2020-10-14: 4000 [IU] via INTRAVENOUS

## 2020-10-14 MED ORDER — FENTANYL CITRATE (PF) 100 MCG/2ML IJ SOLN
INTRAMUSCULAR | Status: AC
  Filled 2020-10-14: qty 2

## 2020-10-14 MED ORDER — NITROGLYCERIN 1 MG/10 ML FOR IR/CATH LAB
INTRA_ARTERIAL | Status: DC | PRN
  Administered 2020-10-14: 400 ug via INTRA_ARTERIAL

## 2020-10-14 MED ORDER — FAMOTIDINE 20 MG PO TABS: 40.0000 mg | ORAL_TABLET | Freq: Once | ORAL | Status: DC | PRN

## 2020-10-14 MED ORDER — ASPIRIN EC 81 MG PO TBEC: 81.0000 mg | DELAYED_RELEASE_TABLET | Freq: Every day | ORAL | 2 refills | Status: DC

## 2020-10-14 MED ORDER — MIDAZOLAM HCL 2 MG/ML PO SYRP: 8.0000 mg | ORAL_SOLUTION | Freq: Once | ORAL | Status: DC | PRN

## 2020-10-14 MED ORDER — ATORVASTATIN CALCIUM 20 MG PO TABS: 20.0000 mg | ORAL_TABLET | Freq: Every day | ORAL | 3 refills | Status: DC

## 2020-10-14 MED ORDER — CEFAZOLIN SODIUM-DEXTROSE 2-4 GM/100ML-% IV SOLN: 2.0000 g | Freq: Once | INTRAVENOUS | Status: AC

## 2020-10-14 MED ORDER — FENTANYL CITRATE (PF) 100 MCG/2ML IJ SOLN
INTRAMUSCULAR | Status: DC | PRN
  Administered 2020-10-14: 50 ug via INTRAVENOUS
  Administered 2020-10-14: 25 ug via INTRAVENOUS

## 2020-10-14 MED ORDER — MORPHINE SULFATE (PF) 4 MG/ML IV SOLN: 2.0000 mg | INTRAVENOUS | Status: DC | PRN

## 2020-10-14 MED ORDER — CEFAZOLIN SODIUM-DEXTROSE 2-4 GM/100ML-% IV SOLN
INTRAVENOUS | Status: AC
  Administered 2020-10-14: 2 g via INTRAVENOUS
  Filled 2020-10-14: qty 100

## 2020-10-14 MED ORDER — ACETAMINOPHEN 325 MG PO TABS: 650.0000 mg | ORAL_TABLET | ORAL | Status: DC | PRN

## 2020-10-14 MED ORDER — SODIUM CHLORIDE 0.9 % IV SOLN: 250.0000 mL | INTRAVENOUS | Status: DC | PRN

## 2020-10-14 MED ORDER — HYDRALAZINE HCL 20 MG/ML IJ SOLN: 5.0000 mg | INTRAMUSCULAR | Status: DC | PRN

## 2020-10-14 MED ORDER — HYDROMORPHONE HCL 1 MG/ML IJ SOLN: 1.0000 mg | Freq: Once | INTRAMUSCULAR | Status: DC | PRN

## 2020-10-14 MED ORDER — ONDANSETRON HCL 4 MG/2ML IJ SOLN: 4.0000 mg | Freq: Four times a day (QID) | INTRAMUSCULAR | Status: DC | PRN

## 2020-10-14 MED ORDER — SODIUM CHLORIDE 0.9% FLUSH: 3.0000 mL | INTRAVENOUS | Status: DC | PRN

## 2020-10-14 MED ORDER — MIDAZOLAM HCL 2 MG/2ML IJ SOLN
INTRAMUSCULAR | Status: DC | PRN
  Administered 2020-10-14: 2 mg via INTRAVENOUS
  Administered 2020-10-14: 1 mg via INTRAVENOUS

## 2020-10-14 MED ORDER — METHYLPREDNISOLONE SODIUM SUCC 125 MG IJ SOLR: 125.0000 mg | Freq: Once | INTRAMUSCULAR | Status: DC | PRN

## 2020-10-14 SURGICAL SUPPLY — 25 items
BALLN LUTONIX 4X220X130 (BALLOONS) ×2
BALLN LUTONIX AV 8X40X75 (BALLOONS) ×2
BALLN ULTRASCOR 014 2.5X40X150 (BALLOONS) ×2
BALLN ULTRASCORE 014 3X40X150 (BALLOONS) ×2
BALLN ULTRASCORE 4X100X130 (BALLOONS) ×2
BALLOON LUTONIX 4X220X130 (BALLOONS) ×1 IMPLANT
BALLOON LUTONIX AV 8X40X75 (BALLOONS) ×1 IMPLANT
BALLOON ULTRASCORE 4X100X130 (BALLOONS) ×1 IMPLANT
BALLOON ULTRSCR 014 2.5X40X150 (BALLOONS) ×1 IMPLANT
BALLOON ULTRSCRE 014 3X40X150 (BALLOONS) ×1 IMPLANT
CATH ANGIO 5F PIGTAIL 65CM (CATHETERS) ×2 IMPLANT
COVER PROBE U/S 5X48 (MISCELLANEOUS) ×2 IMPLANT
DEVICE STARCLOSE SE CLOSURE (Vascular Products) ×2 IMPLANT
GLIDECATH ANGLED 4FR 120CM (CATHETERS) ×2 IMPLANT
GLIDEWIRE ADV .035X260CM (WIRE) ×2 IMPLANT
KIT ENCORE 26 ADVANTAGE (KITS) ×2 IMPLANT
NEEDLE ENTRY 21GA 7CM ECHOTIP (NEEDLE) ×2 IMPLANT
PACK ANGIOGRAPHY (CUSTOM PROCEDURE TRAY) ×2 IMPLANT
SET INTRO CAPELLA COAXIAL (SET/KITS/TRAYS/PACK) ×2 IMPLANT
SHEATH ANL2 6FRX45 HC (SHEATH) ×2 IMPLANT
SHEATH BRITE TIP 5FRX11 (SHEATH) ×2 IMPLANT
SHIELD X-DRAPE GOLD 12X17 (MISCELLANEOUS) ×2 IMPLANT
STENT LIFESTAR 10X40 (Permanent Stent) ×2 IMPLANT
WIRE GUIDERIGHT .035X150 (WIRE) ×2 IMPLANT
WIRE RUNTHROUGH .014X300CM (WIRE) ×2 IMPLANT

## 2020-10-14 NOTE — Discharge Instructions (Signed)
Femoral Site Care  This sheet gives you information about how to care for yourself after your procedure. Your health care provider may also give you more specific instructions. If you have problems or questions, contact your health care provider. What can I expect after the procedure? After the procedure, it is common to have:  Bruising that usually fades within 1-2 weeks.  Tenderness at the site. Follow these instructions at home: Wound care  Follow instructions from your health care provider about how to take care of your insertion site. Make sure you: ? Wash your hands with soap and water before you change your bandage (dressing). If soap and water are not available, use hand sanitizer. ? Change your dressing as told by your health care provider. ? Leave stitches (sutures), skin glue, or adhesive strips in place. These skin closures may need to stay in place for 2 weeks or longer. If adhesive strip edges start to loosen and curl up, you may trim the loose edges. Do not remove adhesive strips completely unless your health care provider tells you to do that.  Do not take baths, swim, or use a hot tub until your health care provider approves.  You may shower 24-48 hours after the procedure or as told by your health care provider. ? Gently wash the site with plain soap and water. ? Pat the area dry with a clean towel. ? Do not rub the site. This may cause bleeding.  Do not apply powder or lotion to the site. Keep the site clean and dry.  Check your femoral site every day for signs of infection. Check for: ? Redness, swelling, or pain. ? Fluid or blood. ? Warmth. ? Pus or a bad smell. Activity  For the first 2-3 days after your procedure, or as long as directed: ? Avoid climbing stairs as much as possible. ? Do not squat.  Do not lift anything that is heavier than 10 lb (4.5 kg), or the limit that you are told, until your health care provider says that it is safe.  Rest as  directed. ? Avoid sitting for a long time without moving. Get up to take short walks every 1-2 hours.  Do not drive for 24 hours if you were given a medicine to help you relax (sedative). General instructions  Take over-the-counter and prescription medicines only as told by your health care provider.  Keep all follow-up visits as told by your health care provider. This is important. Contact a health care provider if you have:  A fever or chills.  You have redness, swelling, or pain around your insertion site. Get help right away if:  The catheter insertion area swells very fast.  You pass out.  You suddenly start to sweat or your skin gets clammy.  The catheter insertion area is bleeding, and the bleeding does not stop when you hold steady pressure on the area.  The area near or just beyond the catheter insertion site becomes pale, cool, tingly, or numb. These symptoms may represent a serious problem that is an emergency. Do not wait to see if the symptoms will go away. Get medical help right away. Call your local emergency services (911 in the [place] Do not drive yourself to the hospital. Summary  After the procedure, it is common to have bruising that usually fades within 1-2 weeks.  Check your femoral site every day for signs of infection.  Do not lift anything that is heavier than 10 lb (4.5 kg), or   the limit that you are told, until your health care provider says that it is safe. This information is not intended to replace advice given to you by your health care provider. Make sure you discuss any questions you have with your health care provider. Document Revised: 04/11/2020 Document Reviewed: 04/11/2020 [employer] Patient Education  2021 [employer]  

## 2020-10-14 NOTE — Op Note (Signed)
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 Introduction catheter into left lower extremity 3rd order catheter placement  2. Contrast injection left lower extremity for distal runoff  3. Percutaneous transluminal angioplasty left superficial femoral and popliteal arteries to 4 mm with Lutonix drug-eluting balloon 4. Percutaneous transluminal angioplasty left posterior tibial and tibioperoneal trunk to 3 mm with a ultra score balloon  5. Percutaneous transluminal angioplasty and stent placement left external iliac artery.             6.  Star close closure right common femoral arteriotomy  Anesthesia: Conscious sedation was administered under my direct supervision by the interventional radiology RN. IV Versed plus fentanyl were utilized. Continuous ECG, pulse oximetry and blood pressure was monitored throughout the entire procedure.  Conscious sedation was for a total of 1 hour 13 minutes 22 seconds.  Sheath: 6 [place] Ansell right common femoral retrograde  Contrast: 90 cc  Fluoroscopy Time: 11.2 minutes  Indications: Kristin [name] presents with increasing pain of the left lower extremity.  She has developed an ankle ulceration which has been nonhealing despite excellent wound care.  Noninvasive studies as well as physical examination support significant atherosclerotic occlusive disease.  This suggests the patient is having limb threatening ischemia. The risks and benefits for angiography with intervention for limb salvage are reviewed all questions answered patient agrees to proceed.  Procedure:Kristin [name] is a 87 y.o. y.o. female  who was identified and appropriate procedural time out was performed. The patient was then placed supine on the table and prepped and draped in the usual sterile fashion.   Ultrasound was placed in the sterile sleeve and the right groin was evaluated the right common femoral artery was echolucent and pulsatile indicating patency. Image was recorded for the permanent record and under real-time visualization a microneedle was inserted into the common femoral artery followed by the microwire and then the micro-sheath. A J-wire was then advanced through the micro-sheath and a 5 [place] sheath was then inserted over a J-wire. J-wire was then advanced and a 5 French pigtail catheter was positioned at the level of T12.  AP projection of the aorta was then obtained. Pigtail catheter was repositioned to above the bifurcation and a RAO view of the pelvis was obtained. Subsequently a pigtail catheter with the stiff angle Glidewire was used to cross the aortic bifurcation the catheter wire were advanced down into the left distal external iliac artery. Oblique view of the femoral bifurcation was then obtained and subsequently the wire was reintroduced and the pigtail catheter negotiated into the SFA representing third order catheter placement. Distal runoff was then performed.  Diagnostic interpretation: The abdominal aorta is opacified with a bolus gesture contrast.  There is diffuse calcific disease but there are no hemodynamically significant stenoses.  The aortic bifurcation is widely patent.  Bilateral common iliac arteries are diffusely diseased but widely patent.  On the left there is a mid focal stenosis approximately 20 mm in length and greater than 70% diameter reduction.  Proximal and distal to this lesion the external iliac artery is patent.  The right external iliac artery is patent.  The left common femoral profunda femoris are patent although diffusely diseased.  The proximal SFA is widely patent  without hemodynamically significant stenosis.  However beginning  at the mid level and extending through Hunter's canal to the above-knee popliteal there is diffuse increasing calcifications with multiple lesions greater than 80%.  With the leg frog-legged and a steep oblique projection I am able to work around the knee prosthesis and the at knee popliteal is widely patent.  Trifurcation is heavily diseased with occlusion of the anterior tibial shortly after its origin.  There is a greater than 80% stenosis at the origin of the tibioperoneal trunk that extends for approximately 5 mm.  There is a greater than 80% stenosis at the distal tibioperoneal trunk extending into the origin of the posterior tibial again for a distance of approximate 5 to 7 mm.  Distally the posterior tibial is the dominant runoff to the foot and free of hemodynamically significant stenosis.  The peroneal is visualized but appears to occlude shortly after its origin.  4000 units of heparin was then given and allowed to circulate and a 6 [place] Ansell sheath was advanced up and over the bifurcation and positioned in the femoral artery.  With the tip of the Ansell sheath in the distal common iliac on the left.  RAO projection a magnified image is made of the external iliac artery.  This demonstrates a greater than 70% stenosis in the midportion.  A 10 mm x 40 mm life star stent is then deployed across this lesion and postdilated with an 8 mm x 40 mm Lutonix drug-eluting balloon inflated to 12 atm for 30 seconds.  Follow-up imaging demonstrates less than 10% residual stenosis.  The dilator is reintroduced through the sheath and the sheath and dilator advanced through the stent under direct fluoroscopic visualization and the tip of the sheath is positioned in the proximal SFA.  The detector was then repositioned and the SFA and popliteal was reimaged demonstrating diffuse disease over approximately 20 cm with multiple greater than 80% stenoses  throughout this segment.  The segment begins in the mid SFA and extends to the above-knee popliteal.  Initially I treated this with a 4 mm x 100 mm ultra score balloon 2 separate inflations were required each to 12 atm for 1 minute.  Next a 4 mm x 220 mm Lutonix drug-eluting balloon was used to treat this area again the inflation was to 12 atm for 1 full minute.  Follow-up imaging in both AP as well as LAO and RAO projections demonstrate less than 15% residual stenosis throughout the entire segment.    Angled glide catheter and advantage wire were then negotiated down into the distal popliteal. Catheter was then advanced. Hand injection contrast demonstrated the tibial anatomy in detail.  A 0.014 run-through wire was then exchanged and with the angled catheter negotiated into the posterior tibial.  A 2.5 mm x 40 mm ultra score balloon was then used to treat the ostial lesion of the posterior tibial as well as the proximal lesion in the tibioperoneal trunk.  Inflations were to 12 atm for 1 minute.  Follow-up imaging demonstrated an excellent result in the posterior tibial lesion with less than 5% residual stenosis.  Significant residual stenosis was noted in the proximal TP trunk and therefore a 3 mm x 40 mm ultra score balloon was advanced across this lesion inflated to 12 atm for 1 minute.  Follow-up imaging now demonstrated less than 10% residual stenosis with preservation of the single-vessel runoff.    After review of these images the sheath is pulled into the right external iliac oblique of the common femoral is obtained  and a Star close device deployed. There no immediate Complications.  Findings:  The abdominal aorta is opacified with a bolus gesture contrast.  There is diffuse calcific disease but there are no hemodynamically significant stenoses.  The aortic bifurcation is widely patent.  Bilateral common iliac arteries are diffusely diseased but widely patent.  On the left there is a mid focal  stenosis approximately 20 mm in length and greater than 70% diameter reduction.  Proximal and distal to this lesion the external iliac artery is patent.  The right external iliac artery is patent.  The left common femoral profunda femoris are patent although diffusely diseased.  The proximal SFA is widely patent without hemodynamically significant stenosis.  However beginning at the mid level and extending through Hunter's canal to the above-knee popliteal there is diffuse increasing calcifications with multiple lesions greater than 80%.  With the leg frog-legged and a steep oblique projection I am able to work around the knee prosthesis and the at knee popliteal is widely patent.  Trifurcation is heavily diseased with occlusion of the anterior tibial shortly after its origin.  There is a greater than 80% stenosis at the origin of the tibioperoneal trunk that extends for approximately 5 mm.  There is a greater than 80% stenosis at the distal tibioperoneal trunk extending into the origin of the posterior tibial again for a distance of approximate 5 to 7 mm.  Distally the posterior tibial is the dominant runoff to the foot and free of hemodynamically significant stenosis.  The peroneal is visualized but appears to occlude shortly after its origin.  Following angioplasty and stent placement in the left external iliac artery there is less than 10% residual stenosis.  Following angioplasty posterior tibial tibial now is in-line flow and looks quite nice less than 10%. Angioplasty of the mid SFA at Hunter's canal and above-knee popliteal yields an excellent result with less than 10% residual stenosis.  Summary: Successful recanalization left lower extremity for limb salvage   Disposition: Patient was taken to the recovery room in stable condition having tolerated the procedure well.  Kristin [name]  10/14/2020,11:49 AM

## 2020-10-14 NOTE — Interval H&P Note (Signed)
History and Physical Interval Note:  10/14/2020 10:26 AM  Kristin [name]  has presented today for surgery, with the diagnosis of Left LE Angio   BARD    ASO w ulceration  Covid  Feb 18.  The various methods of treatment have been discussed with the patient and family. After consideration of risks, benefits and other options for treatment, the patient has consented to  Procedure(s): LOWER EXTREMITY ANGIOGRAPHY (Left) as a surgical intervention.  The patient's history has been reviewed, patient examined, no change in status, stable for surgery.  I have reviewed the patient's chart and labs.  Questions were answered to the patient's satisfaction.     Hortencia Pilar

## 2020-10-15 NOTE — Progress Notes (Signed)
Spoke with patient regarding bra that was left here at the hospital yesterday.  Patient states "just throw it away"  Offered to hold it for her but she refused

## 2020-10-19 NOTE — Progress Notes (Signed)
PROVIDER NOTE: Information contained herein reflects review and annotations entered in association with encounter. Interpretation of such information and data should be left to medically-trained personnel. Information provided to patient can be located elsewhere in the medical record under "Patient Instructions". Document created using STT-dictation technology, any transcriptional errors that may result from process are unintentional.    Patient: Kristin [name]  Service Category: E/M  Provider: Gaspar Cola, MD  DOB: 1929/10/15  DOS: 10/20/2020  Specialty: Interventional Pain Management  MRN: [id number]  Setting: Ambulatory outpatient  PCP: Kristin Hauser, DO  Type: Established Patient    Referring Provider: [name] *  Location: Office  Delivery: Face-to-face     HPI  [name], a 87 y.o. year old female, is here today because of her Chronic pain syndrome [G89.4]. Ms. Wack primary complain today is Back Pain (Back pain) Last encounter: My last encounter with her was on 10/02/2020. Pertinent problems: Ms. 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); Epidural fibrosis; Lumbar foraminal stenosis (Severe Left L4-5); Chronic sacroiliac joint pain (Left); Osteoarthritis involving multiple joints; Chronic neck pain; Chronic hip pain (Bilateral); Chronic cervical radicular pain (3ry area of Pain) (Bilateral) (L>R); Chronic lower  extremity pain (2ry area of Pain) (Bilateral) (L>R); DDD (degenerative disc disease), cervical; Numbness and tingling of upper extremity (Bilateral); Chronic shoulder pain, Radicular (Bilateral) (L>R); Cervical facet syndrome (Bilateral) (L>R); Cervical (3-4 mm) Anterolisthesis of C4 on C5; Cervical foraminal stenosis (Bilateral); Spondylosis without myelopathy or radiculopathy, lumbosacral region; Chronic shoulders pain (Bilateral) (L>R); Osteoarthritis of shoulder (Bilateral); Osteoarthritis of hip (Bilateral); Chronic shoulder pain after shoulder replacement (Bilateral) (L>R); Trigger finger, left middle finger; Cervicalgia; Greater trochanteric bursitis of hip (Left); Other specified dorsopathies, sacral and sacrococcygeal region; Chronic hip pain (Left); Osteoarthritis of hip (Left); Abnormal MRI, lumbar spine (11/03/2019); Chronic sacroiliac pain (Bilateral); Osteoarthritis of sacroiliac joints (Bilateral) (HCC); and Chronic low back pain (Bilateral) w/o sciatica on their pertinent problem list. Pain Assessment: Severity of Chronic pain is reported as a 5 /10. Location: Back Lower/pain radiaties down both leg to her feet, left leg is worse. Onset: More than a month ago. Quality: Sharp,Burning,Aching. Timing: Constant. Modifying factor(s): sit down and meds. Vitals:  height is _0  (1.702 m) and weight is 150 lb (68 kg). Her temperature is 97.1 F (36.2 C) (abnormal). Her blood pressure is 111/59 (abnormal) and her pulse is 64. Her oxygen saturation is 100%.   Reason for encounter: both, medication management and post-procedure assessment.  The patient is currently undergoing a downward opioid taper for the purpose of accomplishing a "Drug Holiday".  If this is being done correctly, she should be taking oxycodone IR 5 mg 4 times daily (20 mg/day of oxycodone).  This should last until 10/24/2020.  In addition the patient has additional prescriptions that we will continue to taper the opioids down until she  is completely off of them by 11/13/2020.  A "Drug Holiday" consist of 14 days being off of the opioids which means that she should be ready to go back to them on 11/27/2020,  should the evaluation revealed that she will be better on them than off of them.  Today she is still complaining of some low back pain and bilateral lower extremity pain but she says that the worst pain is to her lower back.  On 10/14/2020 she had an angioplasty on her left lower extremity and she indicates that they went into her right groin to do that.  She says that she has a stent on the left lower extremity.  She still on her Eliquis.  Right now she indicates her lower back pain to be worse than the lower extremity pain and in terms of this lower extremity pain she refers that today the left side is worse than the right.  Her primary complaint at this point seems to be that of cramps on her legs and hands which apparently happen more often at nighttime.  Today I have provided her with some written information regarding this and some things that she can do on her own with over-the-counter medications or remedies.  I recommended that she try some tonic water and although she can probably take some magnesium, because of her chronic kidney disease I have recommended that she take no more than 1 tablet/day.  She understood and accepted and she indicated that she will try to see if that works for her.  She has been slowly going down on her oxycodone where she refers that currently she is taking the 5 mg tablet and she does not seem to be experiencing any type of withdrawal symptom and therefore her downward taper of this medication seems to be going well.  I have encouraged her to continue with that to see if we can complete that "Drug Holiday".  She already has an appointment to return on 11/24/2020 for medication management.  Her "Drug Holiday" should be completed by 11/27/2020.  Today she does not seem to be on any type of acute distress.  She is  however complaining of some generalized itching and I did look at her sclera but she does not have any evidence of yellowing.  She seems to think that this started after she received some anesthesia for her angioplasty and she was asking me if by any chance they had anything to do with it.  This appears to have been approximately a week ago and asked anesthesiologist I do know that older patients tend to clear those medications to slower than healthy patients.  I provided her with some reassurance and I told her that if it has anything to do with the anesthesia she should be seen any improvement within this next week.  He understood and accepted.  RTCB: 11/27/2020 Nonopioids transferred 06/04/2020: Requip, MiraLAX, Dulcolax, and melatonin  Post-Procedure Evaluation  Procedure (10/02/2020): Palliative bilateral lumbar facet block L5/R3 + bilateral SI joint block L3/R1  under fluoroscopic guidance and IV sedation Pre-procedure pain level: 5/10 Post-procedure: 0/10 (100% relief)  Sedation: Sedation provided.  Effectiveness during initial hour after procedure(Ultra-Short Term Relief): 100 %.  Local anesthetic used: Long-acting (4-6 hours) Effectiveness: Defined as any analgesic benefit obtained secondary to the administration of local anesthetics. This carries significant diagnostic value as to the etiological location, or anatomical origin, of the pain. Duration of benefit is expected to coincide with the duration of the local anesthetic used.  Effectiveness during initial 4-6 hours after procedure(Short-Term Relief): 100 %.  Long-term benefit: Defined as any relief past the pharmacologic duration of the local anesthetics.  Effectiveness past the initial 6 hours after procedure(Long-Term  Relief): 0 %.  Current benefits: Defined as benefit that persist at this time.   Analgesia:  She indicates that she always has some bilateral lower extremity pain with the left today being worse than the right however,  she recently had an angioplasty on 10/14/2020 where they went in through her right groin and she refers that they put a stent on the left lower extremity.  In terms of her lower back pain, she refers that it is still there. Function: Back to baseline ROM: Back to baseline  Pharmacotherapy Assessment   Analgesic: Oxycodone IR 20 mg, 1 tab PO q 8 hrs (60 mg/day of oxycodone) MME/day: 90 mg/day.   Monitoring: [place] PMP: PDMP reviewed during this encounter.       Pharmacotherapy: No side-effects or adverse reactions reported. Compliance: No problems identified. Effectiveness: Clinically acceptable.  Chauncey Fischer, RN  10/20/2020 10:17 AM  Sign when Signing Visit Safety precautions to be maintained throughout the outpatient stay will include: orient to surroundings, keep bed in low position, maintain call bell within reach at all times, provide assistance with transfer out of bed and ambulation.     UDS:  Summary  Date Value Ref Range Status  02/19/2020 Note  Final    Comment:    ==================================================================== [facility] ([facility]) ==================================================================== Test                             Result       Flag       Units  Drug Present and Declared for Prescription Verification   Oxycodone                      4108         EXPECTED   ng/mg creat   Oxymorphone                    1875         EXPECTED   ng/mg creat   Noroxycodone                   >7353        EXPECTED   ng/mg creat   Noroxymorphone                 835          EXPECTED   ng/mg creat    Sources of oxycodone are scheduled prescription medications.    Oxymorphone, noroxycodone, and noroxymorphone are expected    metabolites of oxycodone. Oxymorphone is also available as a    scheduled prescription medication.  ==================================================================== Test                      Result    Flag   Units      Ref Range    Creatinine              136              mg/dL      >=20 ==================================================================== Declared Medications:  The flagging and interpretation on this report are based on the  following declared medications.  Unexpected results may arise from  inaccuracies in the declared medications.   **Note: The testing scope of this panel includes these medications:   Oxycodone   **Note: The testing scope of this panel does not include the  following reported medications:   Acetaminophen (Tylenol)  Albuterol  Apixaban (Eliquis)  Bisacodyl  Calcium  Furosemide (Lasix)  Helium  Levocetirizine (Xyzal)  Lisinopril (Zestril)  Melatonin  Oxygen  Polyethylene Glycol (MiraLAX)  Ropinirole (Requip)  Supplement  Vitamin D ==================================================================== For clinical consultation, please call [phone]. ====================================================================      ROS  Constitutional: Denies any fever or chills Gastrointestinal: No reported hemesis, hematochezia, vomiting, or acute GI distress Musculoskeletal: Denies any acute onset joint swelling, redness, loss of ROM, or weakness Neurological: No reported episodes of acute onset apraxia, aphasia, dysarthria, agnosia, amnesia, paralysis, loss of coordination, or loss of consciousness  Medication Review  Benefiber, Melatonin, Oxygen-Helium, Vitamin D, acetaminophen, albuterol, apixaban, aspirin EC, atorvastatin, bisacodyl, dextromethorphan-guaiFENesin, diclofenac Sodium, furosemide, levocetirizine, lisinopril, mupirocin ointment, oxyCODONE, polyethylene glycol, and rOPINIRole  History Review  Allergy: Ms. Oliveria is allergic to flexeril [cyclobenzaprine hcl] and vancomycin. Drug: Ms. Ruttan  reports current drug use. Drug: Oxycodone. Alcohol:  reports no history of alcohol use. Tobacco:  reports that she quit smoking about 21 years ago. Her smoking use  included cigarettes. She has a 50.00 pack-year smoking history. She has quit using smokeless tobacco. Social: Ms. Yorke  reports that she quit smoking about 21 years ago. Her smoking use included cigarettes. She has a 50.00 pack-year smoking history. She has quit using smokeless tobacco. She reports current drug use. Drug: Oxycodone. She reports that she does not drink alcohol. Medical:  has a past medical history of Acute postoperative pain (02/09/2018), Arrhythmia, sinus node (04/03/2014), Arthritis, Arthritis, Atrial fibrillation ([place]), Back pain, Bradycardia, Breast cancer ([place]) (2004), Cancer ([place]) (2004), CHF (congestive heart failure) ([place]), Chronic back pain, CKD (chronic kidney disease), COPD (chronic obstructive pulmonary disease) ([place]), Cystocele, Decubitus ulcers, Dehydration, Gout, Hematuria, Hepatitis C, Hyperlipidemia, Hypertension, Hypomagnesemia (06/25/2015), OAB (overactive bladder), Overactive bladder, Restless leg, Shoulder pain, bilateral, Urinary frequency, UTI (lower urinary tract infection), and Vaginal atrophy. Surgical: Ms. Cummings  has a past surgical history that includes Abdominal hysterectomy; Back surgery; Rotator cuff repair; Breast surgery; Cataract extraction w/PHACO (10/19/2011); Cataract extraction w/PHACO (11/02/2011); Carpal tunnel release; Joint replacement; Colon surgery; Pacemaker insertion (N/A, 12/24/2015); Cholecystectomy; Breast lumpectomy (Right, 2004); Insert / replace / remove pacemaker; and Lower Extremity Angiography (Left, 10/14/2020). Family: family history includes Breast cancer in her paternal aunt; Breast cancer (age of onset: 47) in her sister; Cancer in her sister; Heart disease in her father and mother; Kidney disease in her brother; Stroke in her mother.  Laboratory Chemistry Profile   Renal Lab Results  Component Value Date   BUN 37 (H) 10/14/2020   CREATININE 1.29 (H) 10/14/2020   BCR 29 (H) 10/02/2020   GFRAA 52 (L) 10/02/2020   GFRNONAA 40 (L)  10/14/2020     Hepatic Lab Results  Component Value Date   AST 31 08/03/2020   ALT 22 08/03/2020   ALBUMIN 3.7 08/03/2020   ALKPHOS 68 08/03/2020     Electrolytes Lab Results  Component Value Date   NA 140 10/02/2020   K 4.1 10/02/2020   CL 104 10/02/2020   CALCIUM 10.5 (H) 10/02/2020   MG 2.0 08/05/2020   PHOS 3.0 08/05/2020     Bone Lab Results  Component Value Date   VD25OH 31 04/01/2020     Inflammation (CRP: Acute Phase) (ESR: Chronic Phase) Lab Results  Component Value Date   CRP <0.5 09/24/2015   ESRSEDRATE 12 09/24/2015   LATICACIDVEN 1.8 08/02/2020       Note: Above Lab results reviewed.  Recent Imaging Review  PERIPHERAL VASCULAR CATHETERIZATION See op  note Note: Reviewed        Physical Exam  General appearance: Well nourished, well developed, and well hydrated. In no apparent acute distress Mental status: Alert, oriented x 3 (person, place, & time)       Respiratory: No evidence of acute respiratory distress Eyes: PERLA Vitals: BP (!) 111/59   Pulse 64   Temp (!) 97.1 F (36.2 C)   Ht _0  (1.702 m)   Wt 150 lb (68 kg)   SpO2 100%   BMI 23.49 kg/m  BMI: Estimated body mass index is 23.49 kg/m as calculated from the following:   Height as of this encounter: _1  (1.702 m).   Weight as of this encounter: 150 lb (68 kg). Ideal: Ideal body weight: 61.6 kg (135 lb 12.9 oz) Adjusted ideal body weight: 64.2 kg (141 lb 7.7 oz)  Assessment   Status Diagnosis  Controlled Controlled Controlled 1. Chronic pain syndrome   2. Chronic low back pain (1ry area of Pain) (Bilateral) (L>R) w/ sciatica (Bilateral)   3. Chronic lower extremity pain (2ry area of Pain) (Bilateral) (L>R)   4. Chronic cervical radicular pain (3ry area of Pain) (Bilateral) (L>R)   5. Pharmacologic therapy   6. Uncomplicated opioid dependence ([place])      Updated Problems: Problem  Uncomplicated Opioid Dependence ([facility])    Plan of Care  Problem-specific:  No  problem-specific Assessment & Plan notes found for this encounter.  Ms. ANNETA ROUNDS has a current medication list which includes the following long-term medication(s): albuterol, apixaban, atorvastatin, bisacodyl, furosemide, levocetirizine, [START ON 11/06/2020] oxycodone, [START ON 10/30/2020] oxycodone, [START ON 10/23/2020] oxycodone, polyethylene glycol, ropinirole, and benefiber.  Pharmacotherapy (Medications Ordered): No orders of the defined types were placed in this encounter.  Orders:  No orders of the defined types were placed in this encounter.  Follow-up plan:   Return for scheduled encounter.      Interventional Therapies  Risk  Complexity Considerations:   NOTE: ELIQUIS ANTICOAGULATION (Stop: x3 days prior to procedure  Restart 6 hours after procedures)   Planned  Pending:   Pending further evaluation   Under consideration:   Diagnostic right SI joint block Possible bilateral SI joint RFA Diagnostic bilateral femoral nerve + obturator NB Possible bilateral femoral nerve + obturator nerve RFA Diagnostic right IA shoulder joint injection   Completed:   Diagnostic/therapeutic right L2 TFESI x1 (01/31/2020)  Diagnostic/therapeutic left L4 TFESI x1 (01/31/2020) Diagnostic left SI joint block x2 (09/27/2019) Palliative left CESI x2 (10/13/2017) Palliative left trochanteric bursa injection x1 (03/27/2019)  Palliative right IA hip inj. x1  (100% on the right) (02/08/2019) Palliative left IA hip inj. x2 (02/08/2019) Palliative right lumbar facet MBB x2(01/10/2020) Palliative left lumbar facet MBB x4(01/10/2020) Palliative right lumbar facet RFA x1 (09/19/18) Palliative left lumbar facet RFA x3 (08/01/18) Diagnostic/therapeutic right suprascapular NB x3 (05/10/2017)  Palliative bilateral suprascapular nerve RFA x1(07/13/18)   Therapeutic  Palliative (PRN) options:   Diagnostic left SI joint block #2  Palliative left CESI #3  Palliative left trochanteric bursa  injection #2  Palliative right IA hip inj. #2  (100% on the right) Palliative left IA hip inj. #3   Palliative right lumbar facet block #3 Palliative left lumbar facet block #5 Palliative right lumbar facet RFA #2  Palliative left lumbar facet RFA #4  Palliative bilateral suprascapular nerve block Palliative bilateral suprascapular nerve RFA #2    Recent Visits Date Type Provider Dept  10/02/20 Office Visit Milinda Pointer, MD [facility]  09/30/20 Procedure visit Milinda Pointer, MD [facility]  09/17/20 Office Visit Milinda Pointer, MD [facility]  Showing recent visits within past 90 days and meeting all other requirements Today's Visits Date Type Provider Dept  10/20/20 Office Visit Milinda Pointer, MD [facility]  Showing today's visits and meeting all other requirements Future Appointments Date Type Provider Dept  11/24/20 Appointment Milinda Pointer, MD [facility]  Showing future appointments within next 90 days and meeting all other requirements  I discussed the assessment and treatment plan with the patient. The patient was provided an opportunity to ask questions and all were answered. The patient agreed with the plan and demonstrated an understanding of the instructions.  Patient advised to call back or seek an in-person evaluation if the symptoms or condition worsens.  Duration of encounter: 46 minutes.  Note by: Gaspar Cola, MD Date: 10/20/2020; Time: 11:08 AM

## 2020-10-20 ENCOUNTER — Encounter: Payer: Self-pay | Admitting: Pain Medicine

## 2020-10-20 ENCOUNTER — Ambulatory Visit: Payer: Medicare HMO | Attending: Pain Medicine | Admitting: Pain Medicine

## 2020-10-20 ENCOUNTER — Other Ambulatory Visit: Payer: Self-pay

## 2020-10-20 VITALS — BP 111/59 | HR 64 | Temp 97.1°F | Ht 67.0 in | Wt 150.0 lb

## 2020-10-20 DIAGNOSIS — M5442 Lumbago with sciatica, left side: Secondary | ICD-10-CM | POA: Diagnosis not present

## 2020-10-20 DIAGNOSIS — M5412 Radiculopathy, cervical region: Secondary | ICD-10-CM | POA: Insufficient documentation

## 2020-10-20 DIAGNOSIS — M79604 Pain in right leg: Secondary | ICD-10-CM | POA: Insufficient documentation

## 2020-10-20 DIAGNOSIS — G894 Chronic pain syndrome: Secondary | ICD-10-CM | POA: Diagnosis not present

## 2020-10-20 DIAGNOSIS — F1120 Opioid dependence, uncomplicated: Secondary | ICD-10-CM | POA: Insufficient documentation

## 2020-10-20 DIAGNOSIS — M79605 Pain in left leg: Secondary | ICD-10-CM | POA: Diagnosis not present

## 2020-10-20 DIAGNOSIS — G8929 Other chronic pain: Secondary | ICD-10-CM | POA: Diagnosis not present

## 2020-10-20 DIAGNOSIS — M5441 Lumbago with sciatica, right side: Secondary | ICD-10-CM | POA: Insufficient documentation

## 2020-10-20 DIAGNOSIS — Z79899 Other long term (current) drug therapy: Secondary | ICD-10-CM | POA: Diagnosis not present

## 2020-10-20 NOTE — Patient Instructions (Signed)
____________________________________________________________________________________________  Muscle Spasms & Cramps  Cause:  The most common cause of muscle spasms and cramps is vitamin and/or electrolyte (calcium, potassium, sodium, etc.) deficiencies.  Possible triggers: Sweating - causes loss of electrolytes thru the skin. Steroids - causes loss of electrolytes thru the urine.  Treatment: 1. Gatorade (or any other electrolyte-replenishing drink) - Take 1, 8 oz glass with each meal (3 times a day). 2. OTC (over-the-counter) Magnesium 400 to 500 mg - Take 1 tablet twice a day (one with breakfast and one before bedtime). If you have kidney problems, talk to your primary care physician before taking any Magnesium. 3. Tonic Water with quinine - Take 1, 8 oz glass before bedtime.   ____________________________________________________________________________________________    

## 2020-10-20 NOTE — Progress Notes (Signed)
Safety precautions to be maintained throughout the outpatient stay will include: orient to surroundings, keep bed in low position, maintain call bell within reach at all times, provide assistance with transfer out of bed and ambulation.  

## 2020-10-27 DIAGNOSIS — H353131 Nonexudative age-related macular degeneration, bilateral, early dry stage: Secondary | ICD-10-CM | POA: Diagnosis not present

## 2020-10-27 DIAGNOSIS — H26493 Other secondary cataract, bilateral: Secondary | ICD-10-CM | POA: Diagnosis not present

## 2020-10-27 DIAGNOSIS — Z961 Presence of intraocular lens: Secondary | ICD-10-CM | POA: Diagnosis not present

## 2020-10-30 ENCOUNTER — Other Ambulatory Visit (INDEPENDENT_AMBULATORY_CARE_PROVIDER_SITE_OTHER): Payer: Self-pay | Admitting: Vascular Surgery

## 2020-10-30 DIAGNOSIS — Z95820 Peripheral vascular angioplasty status with implants and grafts: Secondary | ICD-10-CM

## 2020-11-03 ENCOUNTER — Ambulatory Visit (INDEPENDENT_AMBULATORY_CARE_PROVIDER_SITE_OTHER): Payer: Medicare HMO

## 2020-11-03 ENCOUNTER — Encounter (INDEPENDENT_AMBULATORY_CARE_PROVIDER_SITE_OTHER): Payer: Self-pay | Admitting: Vascular Surgery

## 2020-11-03 ENCOUNTER — Ambulatory Visit (INDEPENDENT_AMBULATORY_CARE_PROVIDER_SITE_OTHER): Payer: Medicare HMO | Admitting: Vascular Surgery

## 2020-11-03 ENCOUNTER — Other Ambulatory Visit: Payer: Self-pay

## 2020-11-03 VITALS — BP 147/79 | HR 80 | Ht 67.0 in | Wt 144.0 lb

## 2020-11-03 DIAGNOSIS — I129 Hypertensive chronic kidney disease with stage 1 through stage 4 chronic kidney disease, or unspecified chronic kidney disease: Secondary | ICD-10-CM | POA: Diagnosis not present

## 2020-11-03 DIAGNOSIS — I4891 Unspecified atrial fibrillation: Secondary | ICD-10-CM | POA: Diagnosis not present

## 2020-11-03 DIAGNOSIS — N183 Chronic kidney disease, stage 3 unspecified: Secondary | ICD-10-CM

## 2020-11-03 DIAGNOSIS — Z95820 Peripheral vascular angioplasty status with implants and grafts: Secondary | ICD-10-CM | POA: Diagnosis not present

## 2020-11-03 DIAGNOSIS — I4892 Unspecified atrial flutter: Secondary | ICD-10-CM | POA: Diagnosis not present

## 2020-11-03 DIAGNOSIS — E785 Hyperlipidemia, unspecified: Secondary | ICD-10-CM

## 2020-11-03 DIAGNOSIS — I7025 Atherosclerosis of native arteries of other extremities with ulceration: Secondary | ICD-10-CM

## 2020-11-03 DIAGNOSIS — I872 Venous insufficiency (chronic) (peripheral): Secondary | ICD-10-CM

## 2020-11-03 NOTE — Progress Notes (Signed)
MRN : [id number]  Kristin [name] is a 87 y.o. (1929-12-02) female who presents with chief complaint of No chief complaint on file. Marland Kitchen  History of Present Illness:   The patient returns to the office for followup and review status post angiogram with intervention on 10/14/2020.   Percutaneous transluminal angioplasty left superficial femoral and popliteal arteries to 4 mm with [employer] drug-eluting balloon  Percutaneous transluminal angioplasty left posterior tibial and tibioperoneal trunk to 3 mm with a ultra score balloon Percutaneous transluminal angioplasty and stent placement left external iliac artery  The patient no improvement in the lower extremity symptoms. No improvement of the patient's claudication distance and persistent rest pain symptoms.  No new ulcers or wounds have occurred since the last visit.  There have been no significant changes to the patient's overall health care.  The patient denies amaurosis fugax or recent TIA symptoms. There are no recent neurological changes noted. The patient denies history of DVT, PE or superficial thrombophlebitis. The patient denies recent episodes of angina or shortness of breath.   ABI's Rt=0.83 and Lt=0.81  (previous ABI's Rt=0.82 and Lt=0.96)   No outpatient medications have been marked as taking for the 11/03/20 encounter (Appointment) with Delana Meyer, Dolores Lory, MD.    Past Medical History:  Diagnosis Date   Acute postoperative pain 02/09/2018   Arrhythmia, sinus node 04/03/2014   Arthritis    Arthritis    Atrial fibrillation (HCC)    Back pain    lower back chronic   Bradycardia    Breast cancer ([place]) 2004   right breast cancer   Cancer ([place]) 2004   rt breast cancer-post lumpectomy- chemo/rad   CHF (congestive heart failure) (HCC)    Chronic back pain    CKD (chronic kidney disease)    COPD (chronic obstructive pulmonary disease) ([place])    wears O2 at 2L via [place] at night   Cystocele    Decubitus ulcers     Dehydration    Gout    Hematuria    Hepatitis C    Hyperlipidemia    Hypertension    Hypomagnesemia 06/25/2015   OAB (overactive bladder)    Overactive bladder    Restless leg    Shoulder pain, bilateral    Urinary frequency    UTI (lower urinary tract infection)    Vaginal atrophy     Past Surgical History:  Procedure Laterality Date   ABDOMINAL HYSTERECTOMY     BACK SURGERY     BREAST LUMPECTOMY Right 2004   BREAST SURGERY     rt lumpectomy   CARPAL TUNNEL RELEASE     CATARACT EXTRACTION W/PHACO  10/19/2011   Procedure: CATARACT EXTRACTION PHACO AND INTRAOCULAR LENS PLACEMENT ([place]);  Surgeon: Elta Guadeloupe T. Gershon Crane, MD;  Location: [facility];  Service: Ophthalmology;  Laterality: Right;  CDE:10.81   CATARACT EXTRACTION W/PHACO  11/02/2011   Procedure: CATARACT EXTRACTION PHACO AND INTRAOCULAR LENS PLACEMENT ([facility]);  Surgeon: Elta Guadeloupe T. Gershon Crane, MD;  Location: [facility];  Service: Ophthalmology;  Laterality: Left;  CDE 8.60   CHOLECYSTECTOMY     3/18   COLON SURGERY     INSERT / REPLACE / REMOVE PACEMAKER     JOINT REPLACEMENT     bilateral TKA   LOWER EXTREMITY ANGIOGRAPHY Left 10/14/2020   Procedure: LOWER EXTREMITY ANGIOGRAPHY;  Surgeon: Katha Cabal, MD;  Location: [facility];  Service: Cardiovascular;  Laterality: Left;   PACEMAKER INSERTION N/A 12/24/2015   Procedure: INSERTION PACEMAKER;  Surgeon: Isaias Cowman, MD;  Location: [facility];  Service: Cardiovascular;  Laterality: N/A;   ROTATOR CUFF REPAIR     bilateral    Social History Social History   Tobacco Use   Smoking status: Former Smoker    Packs/day: 1.25    Years: 40.00    Pack years: 50.00    Types: Cigarettes    Quit date: 12/16/1998    Years since quitting: 21.8   Smokeless tobacco: Former Counsellor Use: Never used  Substance Use Topics   Alcohol use: No    Alcohol/week: 0.0 standard drinks   Drug use: Yes    Types: Oxycodone    Family  History Family History  Problem Relation Age of Onset   Kidney disease Brother    Heart disease Father    Heart disease Mother    Stroke Mother    Cancer Sister    Breast cancer Sister 54   Breast cancer Paternal Aunt    Anesthesia problems Neg Hx    Hypotension Neg Hx    Malignant hyperthermia Neg Hx    Pseudochol deficiency Neg Hx     Allergies  Allergen Reactions   Flexeril [Cyclobenzaprine Hcl]     Severe Rash   Vancomycin Rash    [place] Syndrome     REVIEW OF SYSTEMS (Negative unless checked)  Constitutional: [] Weight loss  [] Fever  [] Chills Cardiac: [] Chest pain   [] Chest pressure   [] Palpitations   [] Shortness of breath when laying flat   [] Shortness of breath with exertion. Vascular:  [x] Pain in legs with walking   [x] Pain in legs at rest  [] History of DVT   [] Phlebitis   [] Swelling in legs   [] Varicose veins   [] Non-healing ulcers Pulmonary:   [] Uses home oxygen   [] Productive cough   [] Hemoptysis   [] Wheeze  [] COPD   [] Asthma Neurologic:  [] Dizziness   [] Seizures   [] History of stroke   [] History of TIA  [] Aphasia   [] Vissual changes   [] Weakness or numbness in arm   [] Weakness or numbness in leg Musculoskeletal:   [] Joint swelling   [] Joint pain   [] Low back pain Hematologic:  [] Easy bruising  [] Easy bleeding   [] Hypercoagulable state   [] Anemic Gastrointestinal:  [] Diarrhea   [] Vomiting  [] Gastroesophageal reflux/heartburn   [] Difficulty swallowing. Genitourinary:  [] Chronic kidney disease   [] Difficult urination  [] Frequent urination   [] Blood in urine Skin:  [] Rashes   [] Ulcers  Psychological:  [] History of anxiety   []  History of major depression.  Physical Examination  There were no vitals filed for this visit. There is no height or weight on file to calculate BMI. Gen: WD/WN, NAD Head: [place]/AT, No temporalis wasting.  Ear/Nose/Throat: Hearing grossly intact, nares w/o erythema or drainage Eyes: PER, EOMI, sclera nonicteric.  Neck: Supple,  no large masses.   Pulmonary:  Good air movement, no audible wheezing bilaterally, no use of accessory muscles.  Cardiac: RRR, no JVD Vascular:  Vessel Right Left  Radial Palpable Palpable  PT Not Palpable Not Palpable  DP Not Palpable Not Palpable  Gastrointestinal: Non-distended. No guarding/no peritoneal signs.  Musculoskeletal: M/S 5/5 throughout.  No deformity or atrophy.  Neurologic: CN 2-12 intact. Symmetrical.  Speech is fluent. Motor exam as listed above. Psychiatric: Judgment intact, Mood & affect appropriate for pt's clinical situation. Dermatologic: No rashes or ulcers noted.  No changes consistent with cellulitis.   CBC Lab Results  Component Value Date   WBC 5.0  08/11/2020   HGB 11.9 (L) 08/11/2020   HCT 40.2 08/11/2020   MCV 101.3 (H) 08/11/2020   PLT 141 (L) 08/11/2020    BMET    Component Value Date/Time   NA 140 10/02/2020 1106   NA 138 09/06/2014 2215   K 4.1 10/02/2020 1106   K 4.4 09/06/2014 2215   CL 104 10/02/2020 1106   CL 106 09/06/2014 2215   CO2 26 10/02/2020 1106   CO2 23 09/06/2014 2215   GLUCOSE 96 10/02/2020 1106   GLUCOSE 92 09/06/2014 2215   BUN 37 (H) 10/14/2020 0951   BUN 25 (H) 09/06/2014 2215   CREATININE 1.29 (H) 10/14/2020 0951   CREATININE 1.09 (H) 10/02/2020 1106   CALCIUM 10.5 (H) 10/02/2020 1106   CALCIUM 9.0 09/06/2014 2215   GFRNONAA 40 (L) 10/14/2020 0951   GFRNONAA 45 (L) 10/02/2020 1106   GFRAA 52 (L) 10/02/2020 1106   CrCl cannot be calculated (Unknown ideal weight.).  COAG Lab Results  Component Value Date   INR 1.24 12/16/2015   INR 1.07 02/17/2011    Radiology PERIPHERAL VASCULAR CATHETERIZATION  Result Date: 10/14/2020 See op note    Assessment/Plan 1. Atherosclerosis of native arteries of the extremities with ulceration ([place]) Recommend:  The patient is status post successful angiogram with intervention.  The patient reports that the claudication symptoms and leg pain are unchanged and continue to  be a major issue.   The patient continues to voice lifestyle limiting changes at this point in time.  Patient should undergo noninvasive studies as ordered. The patient will follow up with me after the studies.   In the mean time the patient should continue walking and an exercise program.  The patient should continue antiplatelet therapy and aggressive treatment of the lipid abnormalities  The patient should continue wearing graduated compression socks 10-15 mmHg strength to control the mild edema.  - VAS [name] LOWER EXTREMITY ARTERIAL DUPLEX; Future  2. Venous insufficiency of both lower extremities No surgery or intervention at this point in time.    I have had a long discussion with the patient regarding venous insufficiency and why it  causes symptoms. I have discussed with the patient the chronic skin changes that accompany venous insufficiency and the long term sequela such as infection and ulceration.  Patient will begin wearing graduated compression stockings class 1 (20-30 mmHg) or compression wraps on a daily basis a prescription was given. The patient will put the stockings on first thing in the morning and removing them in the evening. The patient is instructed specifically not to sleep in the stockings.    In addition, behavioral modification including several periods of elevation of the lower extremities during the day will be continued. I have demonstrated that proper elevation is a position with the ankles at heart level.  The patient is instructed to begin routine exercise, especially walking on a daily basis  3. Atrial fibrillation and flutter ([place]) Continue antiarrhythmia medications as already ordered, these medications have been reviewed and there are no changes at this time.  Continue anticoagulation as ordered by Cardiology Service   4. Benign hypertension with CKD (chronic kidney disease) stage III (HCC) Continue antihypertensive medications as already ordered, these  medications have been reviewed and there are no changes at this time.   5. Hyperlipidemia, unspecified hyperlipidemia type Continue statin as ordered and reviewed, no changes at this time     Hortencia Pilar, MD  11/03/2020 12:01 PM

## 2020-11-04 ENCOUNTER — Other Ambulatory Visit (INDEPENDENT_AMBULATORY_CARE_PROVIDER_SITE_OTHER): Payer: Self-pay | Admitting: Vascular Surgery

## 2020-11-04 DIAGNOSIS — Z9862 Peripheral vascular angioplasty status: Secondary | ICD-10-CM

## 2020-11-04 DIAGNOSIS — I70249 Atherosclerosis of native arteries of left leg with ulceration of unspecified site: Secondary | ICD-10-CM

## 2020-11-05 ENCOUNTER — Encounter (INDEPENDENT_AMBULATORY_CARE_PROVIDER_SITE_OTHER): Payer: Self-pay | Admitting: Nurse Practitioner

## 2020-11-05 ENCOUNTER — Ambulatory Visit (INDEPENDENT_AMBULATORY_CARE_PROVIDER_SITE_OTHER): Payer: Medicare HMO | Admitting: Nurse Practitioner

## 2020-11-05 ENCOUNTER — Ambulatory Visit (INDEPENDENT_AMBULATORY_CARE_PROVIDER_SITE_OTHER): Payer: Medicare HMO

## 2020-11-05 ENCOUNTER — Other Ambulatory Visit: Payer: Self-pay

## 2020-11-05 VITALS — BP 155/75 | HR 80 | Resp 16 | Wt 142.8 lb

## 2020-11-05 DIAGNOSIS — E785 Hyperlipidemia, unspecified: Secondary | ICD-10-CM | POA: Diagnosis not present

## 2020-11-05 DIAGNOSIS — I7025 Atherosclerosis of native arteries of other extremities with ulceration: Secondary | ICD-10-CM | POA: Diagnosis not present

## 2020-11-05 DIAGNOSIS — I70249 Atherosclerosis of native arteries of left leg with ulceration of unspecified site: Secondary | ICD-10-CM | POA: Diagnosis not present

## 2020-11-05 DIAGNOSIS — N183 Chronic kidney disease, stage 3 unspecified: Secondary | ICD-10-CM | POA: Diagnosis not present

## 2020-11-05 DIAGNOSIS — I129 Hypertensive chronic kidney disease with stage 1 through stage 4 chronic kidney disease, or unspecified chronic kidney disease: Secondary | ICD-10-CM

## 2020-11-05 DIAGNOSIS — Z9862 Peripheral vascular angioplasty status: Secondary | ICD-10-CM

## 2020-11-07 ENCOUNTER — Other Ambulatory Visit: Payer: Self-pay

## 2020-11-07 ENCOUNTER — Encounter (INDEPENDENT_AMBULATORY_CARE_PROVIDER_SITE_OTHER): Payer: Self-pay | Admitting: Vascular Surgery

## 2020-11-07 ENCOUNTER — Emergency Department (HOSPITAL_COMMUNITY): Payer: Medicare HMO

## 2020-11-07 ENCOUNTER — Emergency Department (HOSPITAL_COMMUNITY)
Admission: EM | Admit: 2020-11-07 | Discharge: 2020-11-07 | Disposition: A | Discharge status: Home / Self Care | Payer: Medicare HMO | Attending: Emergency Medicine | Admitting: Emergency Medicine

## 2020-11-07 ENCOUNTER — Encounter (HOSPITAL_COMMUNITY): Payer: Self-pay | Admitting: Emergency Medicine

## 2020-11-07 DIAGNOSIS — J449 Chronic obstructive pulmonary disease, unspecified: Secondary | ICD-10-CM | POA: Insufficient documentation

## 2020-11-07 DIAGNOSIS — R059 Cough, unspecified: Secondary | ICD-10-CM | POA: Diagnosis not present

## 2020-11-07 DIAGNOSIS — Z87891 Personal history of nicotine dependence: Secondary | ICD-10-CM | POA: Insufficient documentation

## 2020-11-07 DIAGNOSIS — I130 Hypertensive heart and chronic kidney disease with heart failure and stage 1 through stage 4 chronic kidney disease, or unspecified chronic kidney disease: Secondary | ICD-10-CM | POA: Diagnosis not present

## 2020-11-07 DIAGNOSIS — M549 Dorsalgia, unspecified: Secondary | ICD-10-CM | POA: Diagnosis not present

## 2020-11-07 DIAGNOSIS — N183 Chronic kidney disease, stage 3 unspecified: Secondary | ICD-10-CM | POA: Insufficient documentation

## 2020-11-07 DIAGNOSIS — Z853 Personal history of malignant neoplasm of breast: Secondary | ICD-10-CM | POA: Diagnosis not present

## 2020-11-07 DIAGNOSIS — Z7901 Long term (current) use of anticoagulants: Secondary | ICD-10-CM | POA: Diagnosis not present

## 2020-11-07 DIAGNOSIS — I517 Cardiomegaly: Secondary | ICD-10-CM | POA: Diagnosis not present

## 2020-11-07 DIAGNOSIS — R531 Weakness: Secondary | ICD-10-CM | POA: Insufficient documentation

## 2020-11-07 DIAGNOSIS — Z950 Presence of cardiac pacemaker: Secondary | ICD-10-CM | POA: Insufficient documentation

## 2020-11-07 DIAGNOSIS — Z79899 Other long term (current) drug therapy: Secondary | ICD-10-CM | POA: Insufficient documentation

## 2020-11-07 DIAGNOSIS — Z8616 Personal history of COVID-19: Secondary | ICD-10-CM | POA: Diagnosis not present

## 2020-11-07 DIAGNOSIS — I5032 Chronic diastolic (congestive) heart failure: Secondary | ICD-10-CM | POA: Diagnosis not present

## 2020-11-07 LAB — CBC WITH DIFFERENTIAL/PLATELET
Abs Immature Granulocytes: 0.02 10*3/uL (ref 0.00–0.07)
Basophils Absolute: 0.1 10*3/uL (ref 0.0–0.1)
Basophils Relative: 1 %
Eosinophils Absolute: 0.1 10*3/uL (ref 0.0–0.5)
Eosinophils Relative: 1 %
HCT: 39.7 % (ref 36.0–46.0)
Hemoglobin: 12.3 g/dL (ref 12.0–15.0)
Immature Granulocytes: 0 %
Lymphocytes Relative: 22 %
Lymphs Abs: 1.6 10*3/uL (ref 0.7–4.0)
MCH: 30.2 pg (ref 26.0–34.0)
MCHC: 31 g/dL (ref 30.0–36.0)
MCV: 97.5 fL (ref 80.0–100.0)
Monocytes Absolute: 0.5 10*3/uL (ref 0.1–1.0)
Monocytes Relative: 7 %
Neutro Abs: 5.1 10*3/uL (ref 1.7–7.7)
Neutrophils Relative %: 69 %
Platelets: 173 10*3/uL (ref 150–400)
RBC: 4.07 MIL/uL (ref 3.87–5.11)
RDW: 14.8 % (ref 11.5–15.5)
WBC: 7.4 10*3/uL (ref 4.0–10.5)
nRBC: 0 % (ref 0.0–0.2)

## 2020-11-07 LAB — COMPREHENSIVE METABOLIC PANEL
ALT: 16 U/L (ref 0–44)
AST: 21 U/L (ref 15–41)
Albumin: 3.7 g/dL (ref 3.5–5.0)
Alkaline Phosphatase: 55 U/L (ref 38–126)
Anion gap: 7 (ref 5–15)
BUN: 31 mg/dL — ABNORMAL HIGH (ref 8–23)
CO2: 19 mmol/L — ABNORMAL LOW (ref 22–32)
Calcium: 9 mg/dL (ref 8.9–10.3)
Chloride: 108 mmol/L (ref 98–111)
Creatinine, Ser: 1.43 mg/dL — ABNORMAL HIGH (ref 0.44–1.00)
GFR, Estimated: 35 mL/min — ABNORMAL LOW (ref >60)
Glucose, Bld: 114 mg/dL — ABNORMAL HIGH (ref 70–99)
Potassium: 4.2 mmol/L (ref 3.5–5.1)
Sodium: 134 mmol/L — ABNORMAL LOW (ref 135–145)
Total Bilirubin: 0.6 mg/dL (ref 0.3–1.2)
Total Protein: 7 g/dL (ref 6.5–8.1)

## 2020-11-07 LAB — URINALYSIS, ROUTINE W REFLEX MICROSCOPIC
Bilirubin Urine: NEGATIVE
Glucose, UA: NEGATIVE mg/dL
Hgb urine dipstick: NEGATIVE
Ketones, ur: NEGATIVE mg/dL
Leukocytes,Ua: NEGATIVE
Nitrite: NEGATIVE
Protein, ur: NEGATIVE mg/dL
Specific Gravity, Urine: 1.015 (ref 1.005–1.030)
pH: 5 (ref 5.0–8.0)

## 2020-11-07 LAB — BRAIN NATRIURETIC PEPTIDE: B Natriuretic Peptide: 144 pg/mL — ABNORMAL HIGH (ref 0.0–100.0)

## 2020-11-07 LAB — MAGNESIUM: Magnesium: 2 mg/dL (ref 1.7–2.4)

## 2020-11-07 LAB — TROPONIN I (HIGH SENSITIVITY): Troponin I (High Sensitivity): 12 ng/L (ref <18)

## 2020-11-07 NOTE — ED Provider Notes (Signed)
[facility] Provider Note   CSN: [id number] Arrival date & time: 11/07/20  7353     History Chief Complaint  Patient presents with  . Weakness    Kristin [name] is a 87 y.o. female.  HPI   This patient is a very pleasant 87 year old female, she has a history of atrial fibrillation and takes Eliquis, she has chronic kidney disease, known history of congestive heart failure as well as COPD though she did stop smoking 20 years ago.  She has an overactive bladder and urinates approximately every 2 hours which is very frustrating for her but denies any dysuria.  She presents to the hospital today with a complaint of having generalized weakness.  She reports this has been going on for a couple of weeks.  It has been associated with a recent pneumonia for which she was fully treated and improved but continues to have a cough.  She states that she has not had any difficulty with eating or drinking, she has been able to ambulate though today while she was at the grocery store she had difficulty walking because of generalized weakness.  She does not feel focally weak, it has been going on for a couple of months, the patient is very vague on the details but states it has been for a long time, she is here primarily because a family member wanted her to get checked out.  She does have a known pacemaker, it is in the left upper chest, [facility]  The patient also complains of some lower leg pain, she has had angiography of her lower extremities and was found to have the need for angioplasty and stent placement in the left external iliac artery as well as some angioplasty of the posterior tibial and the mid superficial femoral artery.  The patient reports that after this she continues to have some discomfort in her legs but it is not getting any worse than it has been chronically.  She was seen on February 28 because of a chronic pain syndrome, she has known  degenerative disc disease, gout, lumbar central spinal stenosis.  She continues to take oxycodone  Past Medical History:  Diagnosis Date  . Acute postoperative pain 02/09/2018  . Arrhythmia, sinus node 04/03/2014  . Arthritis   . Arthritis   . Atrial fibrillation ([place])   . Back pain    lower back chronic  . Bradycardia   . Breast cancer ([place]) 2004   right breast cancer  . Cancer [facility]) 2004   rt breast cancer-post lumpectomy- chemo/rad  . CHF (congestive heart failure) ([place])   . Chronic back pain   . CKD (chronic kidney disease)   . COPD (chronic obstructive pulmonary disease) (HCC)    wears O2 at 2L via [place] at night  . Cystocele   . Decubitus ulcers   . Dehydration   . Gout   . Hematuria   . Hepatitis C   . Hyperlipidemia   . Hypertension   . Hypomagnesemia 06/25/2015  . OAB (overactive bladder)   . Overactive bladder   . Restless leg   . Shoulder pain, bilateral   . Urinary frequency   . UTI (lower urinary tract infection)   . Vaginal atrophy     Patient Active Problem List   Diagnosis Date Noted  . Atherosclerosis of native arteries of the extremities with ulceration ([place]) 11/03/2020  . Uncomplicated opioid dependence ([place]) 10/20/2020  . Encounter for chronic  pain management 10/02/2020  . Encounter for pain management counseling 10/02/2020  . Overdose opiate, accidental or unintentional, sequela 10/02/2020  . Chronic kidney disease 10/02/2020  . Chronic sacroiliac pain (Bilateral) 09/30/2020  . Osteoarthritis of sacroiliac joints (Bilateral) ([place]) 09/30/2020  . Chronic low back pain (Bilateral) w/o sciatica 09/30/2020  . Pneumonia 08/03/2020  . Prolonged QT interval 08/02/2020  . Venous insufficiency of both lower extremities 07/04/2020  . COVID-19 06/11/2020  . Varicose veins of left lower extremity with inflammation, with ulcer of ankle limited to breakdown of skin ([place]) 04/01/2020  . Opioid dependence with opioid-induced disorder ([place]) 02/14/2020  . Abnormal  MRI, lumbar spine (11/03/2019) 01/23/2020  . Pharmacologic therapy 12/20/2019  . Other specified dorsopathies, sacral and sacrococcygeal region 06/12/2019  . Chronic hip pain (Left) 06/12/2019  . Osteoarthritis of hip (Left) 06/12/2019  . Greater trochanteric bursitis of hip (Left) 03/12/2019  . Preop testing 01/22/2019  . Cervicalgia 10/03/2018  . Chronic shoulders pain (Bilateral) (L>R) 05/29/2018  . Osteoarthritis of shoulder (Bilateral) 05/29/2018  . Osteoarthritis of hip (Bilateral) 05/29/2018  . Chronic shoulder pain after shoulder replacement (Bilateral) (L>R) 05/29/2018  . Trigger finger, left middle finger 05/29/2018  . Spondylosis without myelopathy or radiculopathy, lumbosacral region 02/09/2018  . Insomnia 02/01/2018  . Numbness and tingling of upper extremity (Bilateral) 09/07/2017  . Chronic shoulder pain, Radicular (Bilateral) (L>R) 09/07/2017  . Cervical facet syndrome (Bilateral) (L>R) 09/07/2017  . Cervical (3-4 mm) Anterolisthesis of C4 on C5 09/07/2017  . Cervical foraminal stenosis (Bilateral) 09/07/2017  . Chronic lower extremity pain (2ry area of Pain) (Bilateral) (L>R) 05/13/2017  . DDD (degenerative disc disease), cervical 05/13/2017  . Chronic cervical radicular pain (3ry area of Pain) (Bilateral) (L>R) 05/04/2017  . Chronic anticoagulation (Eliquis) 05/04/2017  . Chronic hip pain (Bilateral) 04/20/2017  . Chronic neck pain 03/01/2017  . Hyperlipidemia 02/02/2017  . Red blood cell antibody positive 11/18/2016  . Anticoagulated 11/17/2016  . Atrial fibrillation and flutter ([place]) 11/17/2016  . Osteoarthritis involving multiple joints 06/14/2016  . Cardiac pacemaker in situ 01/07/2016  . S/P cardiac pacemaker procedure 01/07/2016  . GERD (gastroesophageal reflux disease) 12/18/2015  . Chronic pain syndrome 06/25/2015  . Chronic low back pain (1ry area of Pain) (Bilateral) (L>R) w/ sciatica (Bilateral) 06/25/2015  . Lumbar facet syndrome (Bilateral) (L>R)  06/25/2015  . Lumbar spondylosis 06/25/2015  . Chronic lumbar radicular pain (Bilateral) (L>R) 06/25/2015  . Restless leg syndrome 06/25/2015  . Myofascial pain 06/25/2015  . Neurogenic pain 06/25/2015  . Long term current use of opiate analgesic 06/25/2015  . Long term prescription opiate use 06/25/2015  . Opiate use (90 MME/Day) 06/25/2015  . Opioid-induced constipation (OIC) 06/25/2015  . Vitamin D insufficiency 06/25/2015  . Grade 1 Retrolisthesis of L1 over L2 06/25/2015  . Lumbar facet hypertrophy (L1-2, L2-3, and L4-5) 06/25/2015  . Lumbar lateral recess stenosis (L1-2 and L4-5) 06/25/2015  . 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 Epidural fibrosis 06/25/2015  . Lumbar foraminal stenosis (Severe Left L4-5) 06/25/2015  . Osteoporosis 06/25/2015  . Chronic sacroiliac joint pain (Left) 06/25/2015  . History of methicillin resistant staphylococcus aureus (MRSA) 06/25/2015  . Mitral valve prolapse 06/25/2015  . Seasonal allergies 06/17/2015  . History of breast cancer 06/17/2015  . Chronic diastolic CHF (congestive heart failure) ([place]) 12/26/2014  . Centrilobular emphysema ([place]) 09/16/2014  . S/P cardiac catheterization 04/04/2014  . DDD (degenerative disc disease), lumbosacral 04/03/2014  . Benign hypertension with CKD (chronic kidney disease) stage III (  [place]) 04/03/2014  . Lumbar central spinal stenosis 04/03/2014  . Chronic kidney disease (CKD), stage III (moderate) ([place]) 10/30/2013  . Gout 10/30/2013    Past Surgical History:  Procedure Laterality Date  . ABDOMINAL HYSTERECTOMY    . BACK SURGERY    . BREAST LUMPECTOMY Right 2004  . BREAST SURGERY     rt lumpectomy  . CARPAL TUNNEL RELEASE    . CATARACT EXTRACTION W/PHACO  10/19/2011   Procedure: CATARACT EXTRACTION PHACO AND INTRAOCULAR LENS PLACEMENT (IOC);  Surgeon: Elta Guadeloupe T. Gershon Crane, MD;  Location: [facility];  Service: Ophthalmology;  Laterality: Right;  CDE:10.81  . CATARACT  EXTRACTION W/PHACO  11/02/2011   Procedure: CATARACT EXTRACTION PHACO AND INTRAOCULAR LENS PLACEMENT (IOC);  Surgeon: Elta Guadeloupe T. Gershon Crane, MD;  Location: [facility];  Service: Ophthalmology;  Laterality: Left;  CDE 8.60  . CHOLECYSTECTOMY     3/18  . COLON SURGERY    . INSERT / REPLACE / REMOVE PACEMAKER    . JOINT REPLACEMENT     bilateral TKA  . LOWER EXTREMITY ANGIOGRAPHY Left 10/14/2020   Procedure: LOWER EXTREMITY ANGIOGRAPHY;  Surgeon: Katha Cabal, MD;  Location: [facility];  Service: Cardiovascular;  Laterality: Left;  . PACEMAKER INSERTION N/A 12/24/2015   Procedure: INSERTION PACEMAKER;  Surgeon: Isaias Cowman, MD;  Location: [facility];  Service: Cardiovascular;  Laterality: N/A;  . ROTATOR CUFF REPAIR     bilateral     OB History   No obstetric history on file.     Family History  Problem Relation Age of Onset  . Kidney disease Brother   . Heart disease Father   . Heart disease Mother   . Stroke Mother   . Cancer Sister   . Breast cancer Sister 35  . Breast cancer Paternal Aunt   . Anesthesia problems Neg Hx   . Hypotension Neg Hx   . Malignant hyperthermia Neg Hx   . Pseudochol deficiency Neg Hx     Social History   Tobacco Use  . Smoking status: Former Smoker    Packs/day: 1.25    Years: 40.00    Pack years: 50.00    Types: Cigarettes    Quit date: 12/16/1998    Years since quitting: 21.9  . Smokeless tobacco: Former Network engineer  . Vaping Use: Never used  Substance Use Topics  . Alcohol use: No    Alcohol/week: 0.0 standard drinks  . Drug use: Yes    Types: Oxycodone    Home Medications Prior to Admission medications   Medication Sig Start Date End Date Taking? Authorizing Provider  acetaminophen (TYLENOL) 500 MG tablet Take 500 mg by mouth in the morning and at bedtime.    [provider]  albuterol (PROVENTIL) (2.5 MG/3ML) 0.083% nebulizer solution Take 3 mLs (2.5 mg total) by nebulization every 8 (eight) hours as needed  for wheezing or shortness of breath. 10/02/19 10/01/20  Karamalegos, Devonne Doughty, DO  apixaban (ELIQUIS) 5 MG TABS tablet Take 1 tablet (5 mg total) by mouth 2 (two) times daily. 10/30/15   Loletha Grayer, MD  aspirin EC 81 MG tablet Take 1 tablet (81 mg total) by mouth daily. Swallow whole. 10/14/20 10/14/21  Schnier, Dolores Lory, MD  atorvastatin (LIPITOR) 20 MG tablet Take 1 tablet (20 mg total) by mouth daily. 10/14/20 01/12/21  Schnier, Dolores Lory, MD  bisacodyl (DULCOLAX) 5 MG EC tablet Take 2 tablets (10 mg total) by mouth daily as needed for moderate constipation. 07/04/20  Karamalegos, Devonne Doughty, DO  Cholecalciferol (VITAMIN D) 2000 units tablet Take 2,000 Units by mouth daily.     [provider]  dextromethorphan-guaiFENesin (MUCINEX DM) 30-600 MG 12hr tablet Take 1 tablet by mouth 2 (two) times daily.    [provider]  diclofenac Sodium (VOLTAREN) 1 % GEL Apply 1 application topically 4 (four) times daily as needed (pain).    [provider]  furosemide (LASIX) 40 MG tablet TAKE (1) TABLET BY MOUTH ONCE DAILY AS NEEDED. Patient taking differently: Take 20 mg by mouth daily. 09/30/20   Kathrine Haddock, NP  levocetirizine (XYZAL) 5 MG tablet Take 0.5 tablets (2.5 mg total) by mouth every other day. 10/02/20   Karamalegos, Devonne Doughty, DO  lisinopril (ZESTRIL) 10 MG tablet Take 10 mg by mouth daily.    [provider]  Melatonin 10 MG TABS Take 10 mg by mouth at bedtime.    [provider]  mupirocin ointment (BACTROBAN) 2 % Apply 1 application topically 2 (two) times daily as needed. For up to 7-10 days as needed to help heal and prevent infection. 04/01/20   Karamalegos, Devonne Doughty, DO  oxyCODONE (OXY IR/ROXICODONE) 5 MG immediate release tablet Take 1 tablet (5 mg total) by mouth daily for 7 days. Max: 1/day. Must last 7 days. 11/06/20 11/13/20  Milinda Pointer, MD  oxyCODONE (OXY IR/ROXICODONE) 5 MG immediate release tablet Take 1 tablet (5 mg total) by  mouth 2 (two) times daily for 7 days. Max: 2/day. Must last 7 days. 10/30/20 11/06/20  Milinda Pointer, MD  oxyCODONE (OXY IR/ROXICODONE) 5 MG immediate release tablet Take 1 tablet (5 mg total) by mouth 3 (three) times daily for 7 days. Max: 3/day. Must last 7 days. 10/23/20 10/30/20  Milinda Pointer, MD  OXYGEN Inhale 2 L into the lungs at bedtime.     [provider]  polyethylene glycol (MIRALAX / GLYCOLAX) 17 g packet Take 17 g by mouth daily as needed for moderate constipation. 07/04/20   Karamalegos, Devonne Doughty, DO  rOPINIRole (REQUIP) 2 MG tablet Take 2 tablets (4 mg total) by mouth at bedtime. 07/04/20   Karamalegos, Devonne Doughty, DO  solifenacin (VESICARE) 5 MG tablet Take 1 tablet by mouth daily. 03/08/16   [provider]  Wheat Dextrin (BENEFIBER) POWD Take 6 g by mouth 3 (three) times daily before meals. (2 tsp = 6 g) Patient taking differently: Take 6 g by mouth 3 (three) times daily as needed (constipation). (2 tsp = 6 g) 08/01/19 07/31/20  Milinda Pointer, MD    Allergies    Flexeril [cyclobenzaprine hcl] and Vancomycin  Review of Systems   Review of Systems  All other systems reviewed and are negative.   Physical Exam Updated Vital Signs BP (!) 175/85 (BP Location: Left Arm)   Pulse 65   Temp 98.1 F (36.7 C) (Oral)   Resp 19   Ht 1.702 m (5\' 7" )   Wt 65 kg   SpO2 98%   BMI 22.44 kg/m   Physical Exam Vitals and nursing note reviewed.  Constitutional:      General: She is not in acute distress.    Appearance: She is well-developed.  HENT:     Head: Normocephalic and atraumatic.     Mouth/Throat:     Pharynx: No oropharyngeal exudate.  Eyes:     General: No scleral icterus.       Right eye: No discharge.        Left eye: No discharge.  Conjunctiva/sclera: Conjunctivae normal.     Pupils: Pupils are equal, round, and reactive to light.  Neck:     Thyroid: No thyromegaly.     Vascular: No JVD.  Cardiovascular:     Rate and Rhythm:  Normal rate and regular rhythm.     Heart sounds: Normal heart sounds. No murmur heard. No friction rub. No gallop.      Comments: Heart rate of 60, wide-complex, normal pulses at the radial arteries Pulmonary:     Effort: Pulmonary effort is normal. No respiratory distress.     Breath sounds: Normal breath sounds. No wheezing or rales.  Abdominal:     General: Bowel sounds are normal. There is no distension.     Palpations: Abdomen is soft. There is no mass.     Tenderness: There is no abdominal tenderness.  Musculoskeletal:        General: No tenderness. Normal range of motion.     Cervical back: Normal range of motion and neck supple.  Lymphadenopathy:     Cervical: No cervical adenopathy.  Skin:    General: Skin is warm and dry.     Findings: No erythema or rash.  Neurological:     Mental Status: She is alert.     Coordination: Coordination normal.     Comments: Able to straight leg raise bilaterally, follows commands with arms and legs, normal coordination, normal speech, no facial droop  Psychiatric:        Behavior: Behavior normal.     ED Results / Procedures / Treatments   Labs (all labs ordered are listed, but only abnormal results are displayed) Labs Reviewed  COMPREHENSIVE METABOLIC PANEL - Abnormal; Notable for the following components:      Result Value   Sodium 134 (*)    CO2 19 (*)    Glucose, Bld 114 (*)    BUN 31 (*)    Creatinine, Ser 1.43 (*)    GFR, Estimated 35 (*)    All other components within normal limits  BRAIN NATRIURETIC PEPTIDE - Abnormal; Notable for the following components:   B Natriuretic Peptide 144.0 (*)    All other components within normal limits  CBC WITH DIFFERENTIAL/PLATELET  MAGNESIUM  URINALYSIS, ROUTINE W REFLEX MICROSCOPIC  TROPONIN I (HIGH SENSITIVITY)    EKG EKG Interpretation  Date/Time:  Friday November 07 2020 19:31:21 EDT Ventricular Rate:  62 PR Interval:    QRS Duration: 158 QT Interval:  412 QTC  Calculation: 419 R Axis:   -64 Text Interpretation: Second degree AV block, Mobitz II Ventricular premature complex RBBB and LAFB LVH with secondary repolarization abnormality Inferior infarct, acute (RCA) Anterior Q waves, possibly due to LVH Lateral leads are also involved Probable RV involvement, suggest recording right precordial leads Baseline wander in lead(s) II III aVF Since last tracing pacer spikes are not seen Confirmed by Noemi Chapel [phone]) on 11/07/2020 7:52:56 PM   Radiology DG Chest Port 1 View  Result Date: 11/07/2020 CLINICAL DATA:  Cough EXAM: PORTABLE CHEST 1 VIEW COMPARISON:  08/11/2020 FINDINGS: Left pacer remains in place, unchanged. Cardiomegaly. Scarring throughout the lungs. No confluent opacities or effusions. Aortic atherosclerosis. Bilateral shoulder replacements. No acute bony abnormality. IMPRESSION: Cardiomegaly/chronic changes.  No active disease. Electronically Signed   By: Rolm Baptise M.D.   On: 11/07/2020 20:28    Procedures Procedures   Medications Ordered in ED Medications - No data to display  ED Course  I have reviewed the triage vital signs and the  nursing notes.  Pertinent labs & imaging results that were available during my care of the patient were reviewed by me and considered in my medical decision making (see chart for details).  Clinical Course as of 11/07/20 2215  Fri Nov 07, 2020  2213 Patient ambulated well to the bathroom and back, she has mild renal insufficiency consistent with prior labs, no other significant findings on electrolytes, urinalysis was clean and not dehydrated, blood counts were normal troponin normal magnesium normal, EKG shows what looks like a likely paced rhythm, discussed the case with the [employer] pacemaker company who states there has been no arrhythmias and the pacemaker is working and functioning per normal.  The patient is stable for discharge [BM]    Clinical Course User Index [BM] Noemi Chapel, MD   MDM  Rules/Calculators/A&P                          This patient has no focal weakness, her vital signs are unremarkable except for a slight blood pressure which is low at 103/79.  She is afebrile, she has a paced rhythm on the monitor.  Her abdomen is soft and nontender, she does not have any increasing signs of vascular abnormalities of her legs and after stenting has overall been better.  We will check labs, magnesium level, urinalysis, the patient otherwise appears to be at her baseline, she will also need her pacer interrogated  Pt well appearing at d/c.  Final Clinical Impression(s) / ED Diagnoses Final diagnoses:  Generalized weakness      Noemi Chapel, MD 11/07/20 2215

## 2020-11-07 NOTE — ED Notes (Signed)
Pt ambulatory to bathroom without assistance, pt states that she feels good and wants to go home, family at bedside.

## 2020-11-07 NOTE — Discharge Instructions (Signed)
Your testing has not shown a definite cause of weakness, all of your tests were reassuring.  Your blood pressure was a little bit elevated but everything else was normal, please take your medications exactly as prescribed by your doctor, follow-up with your doctor within 48 hours for a recheck but come back to the ER for severe or worsening symptoms

## 2020-11-07 NOTE — ED Triage Notes (Signed)
Pt states she has been very weak for "awhile" and went to grocery store today and "could hardly walk d/t weakness" so her "grandaughter insisted I come and get checked out".

## 2020-11-07 NOTE — ED Notes (Signed)
Pacemaker Interrogated.

## 2020-11-07 NOTE — ED Notes (Signed)
Pt has [employer] pacemaker.

## 2020-11-08 DIAGNOSIS — J45998 Other asthma: Secondary | ICD-10-CM | POA: Diagnosis not present

## 2020-11-10 DIAGNOSIS — J45998 Other asthma: Secondary | ICD-10-CM | POA: Diagnosis not present

## 2020-11-11 ENCOUNTER — Encounter: Payer: Self-pay | Admitting: Family Medicine

## 2020-11-11 ENCOUNTER — Other Ambulatory Visit: Payer: Self-pay

## 2020-11-11 ENCOUNTER — Ambulatory Visit (INDEPENDENT_AMBULATORY_CARE_PROVIDER_SITE_OTHER): Payer: Medicare HMO | Admitting: Family Medicine

## 2020-11-11 ENCOUNTER — Encounter (INDEPENDENT_AMBULATORY_CARE_PROVIDER_SITE_OTHER): Payer: Self-pay | Admitting: Nurse Practitioner

## 2020-11-11 VITALS — BP 72/50 | HR 69 | Temp 97.1°F | Ht 67.0 in | Wt 145.0 lb

## 2020-11-11 DIAGNOSIS — R531 Weakness: Secondary | ICD-10-CM | POA: Diagnosis not present

## 2020-11-11 DIAGNOSIS — I9589 Other hypotension: Secondary | ICD-10-CM

## 2020-11-11 DIAGNOSIS — E860 Dehydration: Secondary | ICD-10-CM

## 2020-11-11 DIAGNOSIS — J432 Centrilobular emphysema: Secondary | ICD-10-CM

## 2020-11-11 DIAGNOSIS — J9611 Chronic respiratory failure with hypoxia: Secondary | ICD-10-CM

## 2020-11-11 DIAGNOSIS — E861 Hypovolemia: Secondary | ICD-10-CM | POA: Diagnosis not present

## 2020-11-11 DIAGNOSIS — I5032 Chronic diastolic (congestive) heart failure: Secondary | ICD-10-CM

## 2020-11-11 NOTE — Patient Instructions (Addendum)
Thank you for coming to the office today.  Discontinue Lisinopril 10mg  daily for now. Until advised otherwise to restart.   HOLD Lasix (half of the 40mg ) daily for now. Skip for at least 3+ days, maybe longer. Monitor for signs of swelling and increased blood pressure.  BP is still low.  Likely cause of weakness.  Goal to improve nutrition.  Try rehydration solution or Apple Juice + Water (50/50 mix), G2 low cal gatorade, pedialyte.  Goal is 40-60 oz per day increase fluid. Caution with fluid overload.  [facility] referral for oxygen evaluation - stay tuned.   If not improved by 48 hours, may need hospital ED for IV fluid rehydration and labs.  Please schedule a Follow-up Appointment to: Return if symptoms worsen or fail to improve.  If you have any other questions or concerns, please feel free to call the office or send a message through [facility]. You may also schedule an earlier appointment if necessary.  Additionally, you may be receiving a survey about your experience at our office within a few days to 1 week by e-mail or mail. We value your feedback.  [name], DO [place]

## 2020-11-11 NOTE — Progress Notes (Signed)
Subjective:    Patient ID: [name], female    DOB: 1930-07-25, 87 y.o.   MRN: [id number] Chief Complaint  Patient presents with  . Follow-up    Ultrasound follow up     The patient returns to the office for followup and review status post angiogram with intervention. The patient notes improvement in the lower extremity symptoms. No interval shortening of the patient's claudication distance or rest pain symptoms. Previous wounds have now healed.  No new ulcers or wounds have occurred since the last visit.  Patient notes that the pain she had left lower extremity is gone however pain still persist in the right.  General intervention including:  Procedure(s) Performed: 1. Introduction catheter into left lower extremity 3rd order catheter placement  2. Contrast injection left lower extremity for distal runoff  3. Percutaneous transluminal angioplasty left superficial femoral and popliteal arteries to 4 mm with Lutonix drug-eluting balloon 4. Percutaneous transluminal angioplasty left posterior tibial and tibioperoneal trunk to 3 mm with a ultra score balloon  5. Percutaneous transluminal angioplasty and stent placement left external iliac artery.             6.  Star close closure right common femoral arteriotomy   There have been no significant changes to the patient's overall health care.  The patient denies amaurosis fugax or recent TIA symptoms. There are no recent neurological changes noted. The patient denies history of DVT, PE or superficial thrombophlebitis. The patient denies recent episodes of angina or shortness of breath.    Duplex US of the left lower extremity reveals triphasic waveforms down to the level of the tibial arteries.  The distal anterior and peroneal artery have monophasic waveforms with triphasic posterior tibial artery.   Review of Systems  Cardiovascular: Positive for leg swelling.   Skin: Negative for wound.  All other systems reviewed and are negative.      Objective:   Physical Exam Vitals reviewed.  HENT:     Head: Normocephalic.  Cardiovascular:     Rate and Rhythm: Normal rate.     Pulses:          Dorsalis pedis pulses are 1+ on the right side and 2+ on the left side.       Posterior tibial pulses are detected w/ Doppler on the right side and 2+ on the left side.  Pulmonary:     Effort: Pulmonary effort is normal.  Skin:    General: Skin is warm and dry.  Neurological:     Mental Status: She is alert and oriented to person, place, and time.  Psychiatric:        Mood and Affect: Mood normal.        Behavior: Behavior normal.        Thought Content: Thought content normal.        Judgment: Judgment normal.     BP (!) 155/75 (BP Location: Right Arm)   Pulse 80   Resp 16   Wt 142 lb 12.8 oz (64.8 kg)   BMI 22.37 kg/m   Past Medical History:  Diagnosis Date  . Acute postoperative pain 02/09/2018  . Arrhythmia, sinus node 04/03/2014  . Arthritis   . Arthritis   . Atrial fibrillation ([place])   . Back pain    lower back chronic  . Bradycardia   . Breast cancer ([place]) 2004   right breast cancer  . Cancer [facility]) 2004   rt breast cancer-post lumpectomy- chemo/rad  . CHF (  congestive heart failure) ([place])   . Chronic back pain   . CKD (chronic kidney disease)   . COPD (chronic obstructive pulmonary disease) (HCC)    wears O2 at 2L via [place] at night  . Cystocele   . Decubitus ulcers   . Dehydration   . Gout   . Hematuria   . Hepatitis C   . Hyperlipidemia   . Hypertension   . Hypomagnesemia 06/25/2015  . OAB (overactive bladder)   . Overactive bladder   . Restless leg   . Shoulder pain, bilateral   . Urinary frequency   . UTI (lower urinary tract infection)   . Vaginal atrophy     Social History   Socioeconomic History  . Marital status: Single    Spouse name: Not on file  . Number of children: 2  . Years of education: 33  .  Highest education level: 12th grade  Occupational History  . Occupation: retired  Tobacco Use  . Smoking status: Former Smoker    Packs/day: 1.25    Years: 40.00    Pack years: 50.00    Types: Cigarettes    Quit date: 12/16/1998    Years since quitting: 21.9  . Smokeless tobacco: Former Network engineer  . Vaping Use: Never used  Substance and Sexual Activity  . Alcohol use: No    Alcohol/week: 0.0 standard drinks  . Drug use: Yes    Types: Oxycodone  . Sexual activity: Not Currently    Birth control/protection: Surgical  Other Topics Concern  . Not on file  Social History Narrative  . Not on file   Social Determinants of Health   Financial Resource Strain: Low Risk   . Difficulty of Paying Living Expenses: Not hard at all  Food Insecurity: No Food Insecurity  . Worried About Charity fundraiser in the Last Year: Never true  . Ran Out of Food in the Last Year: Never true  Transportation Needs: No Transportation Needs  . Lack of Transportation (Medical): No  . Lack of Transportation (Non-Medical): No  Physical Activity: Inactive  . Days of Exercise per Week: 0 days  . Minutes of Exercise per Session: 0 min  Stress: No Stress Concern Present  . Feeling of Stress : Not at all  Social Connections: Not on file  Intimate Partner Violence: Not on file    Past Surgical History:  Procedure Laterality Date  . ABDOMINAL HYSTERECTOMY    . BACK SURGERY    . BREAST LUMPECTOMY Right 2004  . BREAST SURGERY     rt lumpectomy  . CARPAL TUNNEL RELEASE    . CATARACT EXTRACTION W/PHACO  10/19/2011   Procedure: CATARACT EXTRACTION PHACO AND INTRAOCULAR LENS PLACEMENT (IOC);  Surgeon: Elta Guadeloupe T. Gershon Crane, MD;  Location: [facility];  Service: Ophthalmology;  Laterality: Right;  CDE:10.81  . CATARACT EXTRACTION W/PHACO  11/02/2011   Procedure: CATARACT EXTRACTION PHACO AND INTRAOCULAR LENS PLACEMENT (IOC);  Surgeon: Elta Guadeloupe T. Gershon Crane, MD;  Location: [facility];  Service: Ophthalmology;  Laterality:  Left;  CDE 8.60  . CHOLECYSTECTOMY     3/18  . COLON SURGERY    . INSERT / REPLACE / REMOVE PACEMAKER    . JOINT REPLACEMENT     bilateral TKA  . LOWER EXTREMITY ANGIOGRAPHY Left 10/14/2020   Procedure: LOWER EXTREMITY ANGIOGRAPHY;  Surgeon: Katha Cabal, MD;  Location: [facility];  Service: Cardiovascular;  Laterality: Left;  . PACEMAKER INSERTION N/A 12/24/2015   Procedure: INSERTION  PACEMAKER;  Surgeon: Isaias Cowman, MD;  Location: [facility];  Service: Cardiovascular;  Laterality: N/A;  . ROTATOR CUFF REPAIR     bilateral    Family History  Problem Relation Age of Onset  . Kidney disease Brother   . Heart disease Father   . Heart disease Mother   . Stroke Mother   . Cancer Sister   . Breast cancer Sister 74  . Breast cancer Paternal Aunt   . Anesthesia problems Neg Hx   . Hypotension Neg Hx   . Malignant hyperthermia Neg Hx   . Pseudochol deficiency Neg Hx     Allergies  Allergen Reactions  . Flexeril [Cyclobenzaprine Hcl]     Severe Rash  . Vancomycin Rash    [place] Syndrome    CBC Latest Ref Rng & Units 11/07/2020 08/11/2020 08/05/2020  WBC 4.0 - 10.5 K/uL 7.4 5.0 9.1  Hemoglobin 12.0 - 15.0 g/dL 12.3 11.9(L) 11.9(L)  Hematocrit 36.0 - 46.0 % 39.7 40.2 38.8  Platelets 150 - 400 K/uL 173 141(L) 141(L)      CMP     Component Value Date/Time   NA 134 (L) 11/07/2020 2002   NA 138 09/06/2014 2215   K 4.2 11/07/2020 2002   K 4.4 09/06/2014 2215   CL 108 11/07/2020 2002   CL 106 09/06/2014 2215   CO2 19 (L) 11/07/2020 2002   CO2 23 09/06/2014 2215   GLUCOSE 114 (H) 11/07/2020 2002   GLUCOSE 92 09/06/2014 2215   BUN 31 (H) 11/07/2020 2002   BUN 25 (H) 09/06/2014 2215   CREATININE 1.43 (H) 11/07/2020 2002   CREATININE 1.09 (H) 10/02/2020 1106   CALCIUM 9.0 11/07/2020 2002   CALCIUM 9.0 09/06/2014 2215   PROT 7.0 11/07/2020 2002   PROT 7.1 07/10/2014 1219   ALBUMIN 3.7 11/07/2020 2002   ALBUMIN 3.2 (L) 07/10/2014 1219   AST 21  11/07/2020 2002   AST 26 07/10/2014 1219   ALT 16 11/07/2020 2002   ALT 15 07/10/2014 1219   ALKPHOS 55 11/07/2020 2002   ALKPHOS 69 07/10/2014 1219   BILITOT 0.6 11/07/2020 2002   BILITOT 0.4 07/10/2014 1219   GFRNONAA 35 (L) 11/07/2020 2002   GFRNONAA 45 (L) 10/02/2020 1106   GFRAA 52 (L) 10/02/2020 1106     ABI WITH/WO TBI  Result Date: 11/10/2020 LOWER EXTREMITY DOPPLER STUDY  Comparison Study: 10/14/2020 Performing Technologist: Charlane Ferretti RT (R)(VS)  Examination Guidelines: A complete evaluation includes at minimum, Doppler waveform signals and systolic blood pressure reading at the level of bilateral brachial, anterior tibial, and posterior tibial arteries, when vessel segments are accessible. Bilateral testing is considered an integral part of a complete examination. Photoelectric Plethysmograph (PPG) waveforms and toe systolic pressure readings are included as required and additional duplex testing as needed. Limited examinations for reoccurring indications may be performed as noted.  ABI Findings: +---------+------------------+-----+----------+--------+ Right    Rt Pressure (mmHg)IndexWaveform  Comment  +---------+------------------+-----+----------+--------+ Brachial 122                                       +---------+------------------+-----+----------+--------+ ATA      101               0.83 monophasic         +---------+------------------+-----+----------+--------+ PTA      99  0.81 monophasic         +---------+------------------+-----+----------+--------+ Great Toe45                0.37 Abnormal           +---------+------------------+-----+----------+--------+ +---------+------------------+-----+----------+-------+ Left     Lt Pressure (mmHg)IndexWaveform  Comment +---------+------------------+-----+----------+-------+ Brachial 112                                      +---------+------------------+-----+----------+-------+  ATA      99                0.81 monophasic        +---------+------------------+-----+----------+-------+ PTA      98                0.80 monophasic        +---------+------------------+-----+----------+-------+ Great Toe75                0.61 Abnormal          +---------+------------------+-----+----------+-------+ +-------+-----------+-----------+------------+------------+ ABI/TBIToday's ABIToday's TBIPrevious ABIPrevious TBI +-------+-----------+-----------+------------+------------+ Right  .83        .37        .82         .60          +-------+-----------+-----------+------------+------------+ Left   .81        .61        .96         .61          +-------+-----------+-----------+------------+------------+ Right ABIs appear essentially unchanged compared to prior study on 10/14/2020. Left ABIs appear decreased compared to prior study on 10/14/2020. Bilateral TBI's appear essentially unchanged as compared to the previous exam on 10/14/2020.  Summary: Right: Resting right ankle-brachial index indicates mild right lower extremity arterial disease. The right toe-brachial index is abnormal. Left: Resting left ankle-brachial index indicates mild left lower extremity arterial disease. The left toe-brachial index is abnormal. *See table(s) above for measurements and observations.  Electronically signed by Hortencia Pilar MD on 11/10/2020 at 5:16:23 PM.   Final    VAS [name] ABI WITH/WO TBI  Result Date: 09/29/2020 LOWER EXTREMITY DOPPLER STUDY  Performing Technologist: Charlane Ferretti RT (R)(VS)  Examination Guidelines: A complete evaluation includes at minimum, Doppler waveform signals and systolic blood pressure reading at the level of bilateral brachial, anterior tibial, and posterior tibial arteries, when vessel segments are accessible. Bilateral testing is considered an integral part of a complete examination. Photoelectric Plethysmograph (PPG) waveforms and toe systolic pressure  readings are included as required and additional duplex testing as needed. Limited examinations for reoccurring indications may be performed as noted.  ABI Findings: +---------+------------------+-----+----------+--------+ Right    Rt Pressure (mmHg)IndexWaveform  Comment  +---------+------------------+-----+----------+--------+ Brachial 141                                       +---------+------------------+-----+----------+--------+ ATA      116               0.82 monophasic         +---------+------------------+-----+----------+--------+ PTA      108               0.77 monophasic         +---------+------------------+-----+----------+--------+ Alfonse Spruce  0.60 Abnormal           +---------+------------------+-----+----------+--------+ +---------+------------------+-----+----------+-------+ Left     Lt Pressure (mmHg)IndexWaveform  Comment +---------+------------------+-----+----------+-------+ Brachial 139                                      +---------+------------------+-----+----------+-------+ ATA      126               0.89 monophasic        +---------+------------------+-----+----------+-------+ PTA      136               0.96 monophasic        +---------+------------------+-----+----------+-------+ Great Toe64                0.45 Normal            +---------+------------------+-----+----------+-------+ Summary: Right: Resting right ankle-brachial index indicates mild right lower extremity arterial disease. The right toe-brachial index is abnormal. Left: Resting left ankle-brachial index is within normal range. No evidence of significant left lower extremity arterial disease. The left toe-brachial index is abnormal. Although ankle brachial indices are within normal limits (0.95-1.29), arterial Doppler waveforms at the ankle suggest some component of arterial occlusive disease. *See table(s) above for measurements and observations.   Electronically signed by Hortencia Pilar MD on 09/29/2020 at 1:45:06 PM.   Final        Assessment & Plan:   1. Atherosclerosis of native arteries of the extremities with ulceration ([place]) Recommend:  The patient is status post successful angiogram with intervention.  The patient reports that the claudication symptoms and leg pain is essentially gone.   The patient denies lifestyle limiting changes at this point in time.  No further invasive studies, angiography or surgery at this time The patient should continue walking and begin a more formal exercise program.  The patient should continue antiplatelet therapy and aggressive treatment of the lipid abnormalities  The patient does continue to have some pain in the right lower extremity but she does not wish to proceed with any intervention at this time.  Patient is advised that if she begins to develop new ulcerations or abdominal pain we should discuss treatment of the right lower extremity.  The patient should continue wearing graduated compression socks 10-15 mmHg strength to control the mild edema.  Patient should undergo noninvasive studies as ordered. The patient will follow up with me after the studies.    2. Hyperlipidemia, unspecified hyperlipidemia type Continue statin as ordered and reviewed, no changes at this time   3. Benign hypertension with CKD (chronic kidney disease) stage III (HCC) Continue antihypertensive medications as already ordered, these medications have been reviewed and there are no changes at this time.    Current Outpatient Medications on File Prior to Visit  Medication Sig Dispense Refill  . acetaminophen (TYLENOL) 500 MG tablet Take 500 mg by mouth in the morning and at bedtime.    Marland Kitchen apixaban (ELIQUIS) 5 MG TABS tablet Take 1 tablet (5 mg total) by mouth 2 (two) times daily. 60 tablet 0  . aspirin EC 81 MG tablet Take 1 tablet (81 mg total) by mouth daily. Swallow whole. 150 tablet 2  . atorvastatin  (LIPITOR) 20 MG tablet Take 1 tablet (20 mg total) by mouth daily. 30 tablet 3  . bisacodyl (DULCOLAX) 5 MG EC tablet Take 2 tablets (10 mg total) by mouth daily as needed  for moderate constipation. 180 tablet 1  . Cholecalciferol (VITAMIN D) 2000 units tablet Take 2,000 Units by mouth daily.     Marland Kitchen dextromethorphan-guaiFENesin (MUCINEX DM) 30-600 MG 12hr tablet Take 1 tablet by mouth 2 (two) times daily.    . diclofenac Sodium (VOLTAREN) 1 % GEL Apply 1 application topically 4 (four) times daily as needed (pain).    . furosemide (LASIX) 40 MG tablet TAKE (1) TABLET BY MOUTH ONCE DAILY AS NEEDED. (Patient taking differently: Take 20 mg by mouth daily.) 30 tablet 11  . levocetirizine (XYZAL) 5 MG tablet Take 0.5 tablets (2.5 mg total) by mouth every other day. 23 tablet 4  . lisinopril (ZESTRIL) 10 MG tablet Take 10 mg by mouth daily.    . Melatonin 10 MG TABS Take 10 mg by mouth at bedtime.    . mupirocin ointment (BACTROBAN) 2 % Apply 1 application topically 2 (two) times daily as needed. For up to 7-10 days as needed to help heal and prevent infection. 22 g 2  . oxyCODONE (OXY IR/ROXICODONE) 5 MG immediate release tablet Take 1 tablet (5 mg total) by mouth daily for 7 days. Max: 1/day. Must last 7 days. 7 tablet 0  . oxyCODONE (OXY IR/ROXICODONE) 5 MG immediate release tablet Take 1 tablet (5 mg total) by mouth 2 (two) times daily for 7 days. Max: 2/day. Must last 7 days. 14 tablet 0  . OXYGEN Inhale 2 L into the lungs at bedtime.     . polyethylene glycol (MIRALAX / GLYCOLAX) 17 g packet Take 17 g by mouth daily as needed for moderate constipation. 90 packet 1  . rOPINIRole (REQUIP) 2 MG tablet Take 2 tablets (4 mg total) by mouth at bedtime. 180 tablet 1  . solifenacin (VESICARE) 5 MG tablet Take 1 tablet by mouth daily.    Marland Kitchen albuterol (PROVENTIL) (2.5 MG/3ML) 0.083% nebulizer solution Take 3 mLs (2.5 mg total) by nebulization every 8 (eight) hours as needed for wheezing or shortness of breath. 75  mL 3  . oxyCODONE (OXY IR/ROXICODONE) 5 MG immediate release tablet Take 1 tablet (5 mg total) by mouth 3 (three) times daily for 7 days. Max: 3/day. Must last 7 days. 21 tablet 0  . Wheat Dextrin (BENEFIBER) POWD Take 6 g by mouth 3 (three) times daily before meals. (2 tsp = 6 g) (Patient taking differently: Take 6 g by mouth 3 (three) times daily as needed (constipation). (2 tsp = 6 g)) 730 g 8   No current facility-administered medications on file prior to visit.    There are no Patient Instructions on file for this visit. No follow-ups on file.   Kris Hartmann, NP

## 2020-11-11 NOTE — Progress Notes (Signed)
Subjective:    [name], female    DOB: [birth year], 87 y.o.   MRN: [id number]  Kristin [name] is a 87 y.o. female presenting on 11/11/2020 for Shortness of Breath (Pt still feels the same as she was seen at the ER. /Patient has no taken her lisinopril medication cause last night her BP was 89/48.) and Fatigue (Pt says she still feels very weak./Pt was seen at the ER for feeling weak on 11/07/2020./It has not improved since she was seen at the ER. She can hardly stand on her feet.)   HPI   Dyspnea Fatigue Chronic Respiratory Failure with Hypoxia on oxygen Centrilobular Emphysema Diastolic CHF Hypotension  Worsening problem since last week Thurs. She went to ED on 11/07/20, at that time no identified source of infection or problem, not significantly dehydrated. Had mild renal insufficiency on lab some electrolyte abnormality. BP was 100/70 range.  She had one day last week with activity at grocery store without oxygen then had setback with breathing now.  Home BP yesterday 89/48 She has been holding Lisinopril 10mg  She feels very weak Chronic respiratory failure - on home continuous oxygen 2-3 L Symptoms worsened since this past Thursday, she did trip to grocery store without wearing oxygen  Onset chronic respiratory failure since 07/2020 hospitalization with pneumonia complication, previously had initially pneumonia then COVID 05/2020 then the pneumonia recurrent in 07/2020. On oxygen since Has not seen Pulm  Poor PO but some partial amount of 3 meals, can skip 1 meal a day. She is taking 1 can/serving of Ensure / Boost.   Depression screen [facility] 2/9 09/30/2020 09/17/2020 05/13/2020  Decreased Interest 0 0 0  Down, Depressed, Hopeless 0 0 0  PHQ - 2 Score 0 0 0  Altered sleeping - - -  Tired, decreased energy - - -  Change in appetite - - -  Feeling bad or failure about yourself  - - -  Trouble concentrating - - -  Moving slowly or fidgety/restless - - -  Suicidal  thoughts - - -  PHQ-9 Score - - -  Difficult doing work/chores - - -  Some recent data might be hidden    Social History   Tobacco Use  . Smoking status: Former Smoker    Packs/day: 1.25    Years: 40.00    Pack years: 50.00    Types: Cigarettes    Quit date: 12/16/1998    Years since quitting: 21.9  . Smokeless tobacco: Former Network engineer  . Vaping Use: Never used  Substance Use Topics  . Alcohol use: No    Alcohol/week: 0.0 standard drinks  . Drug use: Yes    Types: Oxycodone    Review of Systems Per HPI unless specifically indicated above      Objective:    BP (!) 72/50 (BP Location: Left Arm, Cuff Size: Normal)   Pulse 69   Temp (!) 97.1 F (36.2 C) (Temporal)   Ht 5\' 7"  (1.702 m)   Wt 145 lb (65.8 kg)   SpO2 98%   BMI 22.71 kg/m   Wt Readings from Last 3 Encounters:  11/11/20 145 lb (65.8 kg)  11/07/20 143 lb 4.8 oz (65 kg)  11/05/20 142 lb 12.8 oz (64.8 kg)    Physical Exam Vitals and nursing note reviewed.  Constitutional:      General: She is not in acute distress.    Appearance: She is well-developed. She is not diaphoretic.  Comments: Chronically ill appearing 87 year old female some frail appearance, cooperative, in wheelchair, has portable oxygen 3L today  HENT:     Head: Normocephalic and atraumatic.  Eyes:     General:        Right eye: No discharge.        Left eye: No discharge.     Conjunctiva/sclera: Conjunctivae normal.     Pupils: Pupils are equal, round, and reactive to light.  Neck:     Thyroid: No thyromegaly.  Cardiovascular:     Rate and Rhythm: Normal rate and regular rhythm.     Heart sounds: Normal heart sounds. No murmur heard.   Pulmonary:     Effort: Pulmonary effort is normal. No respiratory distress.     Breath sounds: Decreased breath sounds (Slight decreased breath sounds generalized) present. No wheezing or rales.  Abdominal:     General: Bowel sounds are normal. There is no distension.     Palpations:  Abdomen is soft. There is no mass.     Tenderness: There is no abdominal tenderness.  Musculoskeletal:        General: No tenderness. Normal range of motion.     Cervical back: Normal range of motion and neck supple.     Right lower leg: No edema.     Left lower leg: No edema.  Lymphadenopathy:     Cervical: No cervical adenopathy.  Skin:    General: Skin is warm and dry.     Findings: No erythema or rash.  Neurological:     Mental Status: She is alert and oriented to person, place, and time.     Comments: Distal sensation intact to light touch all extremities  Psychiatric:        Behavior: Behavior normal.     Comments: Well groomed, good eye contact, normal speech and thoughts      I have personally reviewed the radiology report from 11/07/20 CXR from ED.   CLINICAL DATA:  Cough  EXAM: PORTABLE CHEST 1 VIEW  COMPARISON:  08/11/2020  FINDINGS: Left pacer remains in place, unchanged. Cardiomegaly. Scarring throughout the lungs. No confluent opacities or effusions. Aortic atherosclerosis. Bilateral shoulder replacements. No acute bony abnormality.  IMPRESSION: Cardiomegaly/chronic changes.  No active disease.   Electronically Signed   By: Rolm Baptise M.D.   On: 11/07/2020 20:28  Results for orders placed or performed during the hospital encounter of 11/07/20  CBC with Differential  Result Value Ref Range   WBC 7.4 4.0 - 10.5 K/uL   RBC 4.07 3.87 - 5.11 MIL/uL   Hemoglobin 12.3 12.0 - 15.0 g/dL   HCT 39.7 36.0 - 46.0 %   MCV 97.5 80.0 - 100.0 fL   MCH 30.2 26.0 - 34.0 pg   MCHC 31.0 30.0 - 36.0 g/dL   RDW 14.8 11.5 - 15.5 %   Platelets 173 150 - 400 K/uL   nRBC 0.0 0.0 - 0.2 %   Neutrophils Relative % 69 %   Neutro Abs 5.1 1.7 - 7.7 K/uL   Lymphocytes Relative 22 %   Lymphs Abs 1.6 0.7 - 4.0 K/uL   Monocytes Relative 7 %   Monocytes Absolute 0.5 0.1 - 1.0 K/uL   Eosinophils Relative 1 %   Eosinophils Absolute 0.1 0.0 - 0.5 K/uL   Basophils  Relative 1 %   Basophils Absolute 0.1 0.0 - 0.1 K/uL   Immature Granulocytes 0 %   Abs Immature Granulocytes 0.02 0.00 - 0.07 K/uL  Comprehensive  metabolic panel  Result Value Ref Range   Sodium 134 (L) 135 - 145 mmol/L   Potassium 4.2 3.5 - 5.1 mmol/L   Chloride 108 98 - 111 mmol/L   CO2 19 (L) 22 - 32 mmol/L   Glucose, Bld 114 (H) 70 - 99 mg/dL   BUN 31 (H) 8 - 23 mg/dL   Creatinine, Ser 1.43 (H) 0.44 - 1.00 mg/dL   Calcium 9.0 8.9 - 10.3 mg/dL   Total Protein 7.0 6.5 - 8.1 g/dL   Albumin 3.7 3.5 - 5.0 g/dL   AST 21 15 - 41 U/L   ALT 16 0 - 44 U/L   Alkaline Phosphatase 55 38 - 126 U/L   Total Bilirubin 0.6 0.3 - 1.2 mg/dL   GFR, Estimated 35 (L) >60 mL/min   Anion gap 7 5 - 15  Magnesium  Result Value Ref Range   Magnesium 2.0 1.7 - 2.4 mg/dL  Urinalysis, Routine w reflex microscopic Urine, Clean Catch  Result Value Ref Range   Color, Urine YELLOW YELLOW   APPearance CLEAR CLEAR   Specific Gravity, Urine 1.015 1.005 - 1.030   pH 5.0 5.0 - 8.0   Glucose, UA NEGATIVE NEGATIVE mg/dL   Hgb urine dipstick NEGATIVE NEGATIVE   Bilirubin Urine NEGATIVE NEGATIVE   Ketones, ur NEGATIVE NEGATIVE mg/dL   Protein, ur NEGATIVE NEGATIVE mg/dL   Nitrite NEGATIVE NEGATIVE   Leukocytes,Ua NEGATIVE NEGATIVE  Brain natriuretic peptide  Result Value Ref Range   B Natriuretic Peptide 144.0 (H) 0.0 - 100.0 pg/mL  Troponin I (High Sensitivity)  Result Value Ref Range   Troponin I (High Sensitivity) 12 <18 ng/L      Assessment & Plan:   Problem List Items Addressed This Visit    Chronic diastolic CHF (congestive heart failure) ([place])   Relevant Orders   Ambulatory referral to Pulmonology   Centrilobular emphysema ([place])   Relevant Orders   Ambulatory referral to Pulmonology    Other Visit Diagnoses    Chronic respiratory failure with hypoxia ([place])    -  Primary   Relevant Orders   Ambulatory referral to Pulmonology   Generalized weakness       Relevant Orders   Ambulatory  referral to Pulmonology   Dehydration       Hypotension due to hypovolemia         Centrilobular Emphysema  Chronic Respiratory Failure w/ Hypoxia On Supplemental continuous oxygen 2L  Seems provoked episode recently with inc activity without oxygen, but maintain oxygen saturation on her 2L at baseline.  referral to Pulmonology for chronic respiratory failure with hypoxia, history of emphysema, on 2L daily oxygen, since 07/2020, she had pneumonia and COVID and then recurrent pneumonia hospitalized complex course in 07/2020, now remains on chronic oxygen for past 3+ months, has had significant difficulty if remains off oxygen, requesting further assistance and management of pulmonology since not improved and complicated course.   #Dehydration #Hypotension #Diastolic CHF Clinically volume is down today or dry, no edema. No sign of acute CHF exacerbation  Seems since recent ED visit past few days with onset symptoms she has been dry or has had lower BP and dehydrated, not taking in as much PO, still trying some meals and Ensure once daily but 30 oz fluid or less on average.  Lisinopril 10mg  BP med has been HELD by family caregiver She is still receiving Furosemide 20mg  daily now, we advised now to HOLD Furosemide for at least 3+ days until determine if  her symptoms are improving.  May need to hold Furosemide and Lisinopril indefinitely for now, unless fluid or BP increases. May use Furosemide PRN ONLY going forward  Can follow back up with Cardiology at [facility] for consultation if further concerns with fluid balance.  Main concern was hypotension and general weak/fatigue Her BP has remained low since ED visit 3/18  Counseling on improving nutrition, fluid intake as advised re hydration, she prefers and we agree upon a oral rehydration trial first for 48 hours instead of return to ED today for IV fluid. If NOT improving, still low Bp at home, and symptomatic and poor PO intake, will need  hospital evaluation again check chemistry and BP and may need IVF  Luvenia Redden grand daughter after visit as well to reiterate one additional thought that the Oxycodone could also lower her BP more, hypotension assoc with opiate, but now patient is already being weaned to lower doses 5mg  daily for past week then off on Thursday for 2 weeks. Unlikely to be major factor for her but can monitor BP around opiate dose.  She asked about laser cataract surgery in future, no anesthesia, I advised reschedule it for 1-2 weeks when BP / hydration status is improved.   Orders Placed This Encounter  Procedures  . Ambulatory referral to Pulmonology    Referral Priority:   Routine    Referral Type:   Consultation    Referral Reason:   Specialty Services Required    Requested Specialty:   Pulmonary Disease    Number of Visits Requested:   1     No orders of the defined types were placed in this encounter.     Follow up plan: Return if symptoms worsen or fail to improve.  [name], [facility] 11/11/2020, 2:31 PM

## 2020-11-19 NOTE — Progress Notes (Signed)
PROVIDER NOTE: Information contained herein reflects review and annotations entered in association with encounter. Interpretation of such information and data should be left to medically-trained personnel. Information provided to patient can be located elsewhere in the medical record under "Patient Instructions". Document created using STT-dictation technology, any transcriptional errors that may result from process are unintentional.    Patient: Kristin [name]  Service Category: E/M  Provider: Gaspar Cola, MD  DOB: 1930-08-16  DOS: 11/24/2020  Specialty: Interventional Pain Management  MRN: [id number]  Setting: Ambulatory outpatient  PCP: Olin Hauser, DO  Type: Established Patient    Referring Provider: [name] *  Location: Office  Delivery: Face-to-face     HPI  [name], a 87 y.o. year old female, is here today because of her Chronic pain syndrome [G89.4]. Ms. Sheperd primary complain today is Back Pain Last encounter: My last encounter with her was on 10/20/2020. Pertinent problems: Ms. 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); Epidural fibrosis; Lumbar foraminal stenosis (Severe Left L4-5); Chronic sacroiliac joint pain (Left); Osteoarthritis involving multiple joints; Chronic neck pain; Chronic hip pain (Bilateral); Chronic cervical radicular pain (3ry area of Pain) (Bilateral) (L>R); Chronic lower extremity pain  (2ry area of Pain) (Bilateral) (L>R); DDD (degenerative disc disease), cervical; Numbness and tingling of upper extremity (Bilateral); Chronic shoulder pain, Radicular (Bilateral) (L>R); Cervical facet syndrome (Bilateral) (L>R); Cervical (3-4 mm) Anterolisthesis of C4 on C5; Cervical foraminal stenosis (Bilateral); Spondylosis without myelopathy or radiculopathy, lumbosacral region; Chronic shoulders pain (Bilateral) (L>R); Osteoarthritis of shoulder (Bilateral); Osteoarthritis of hip (Bilateral); Chronic shoulder pain after shoulder replacement (Bilateral) (L>R); Trigger finger, left middle finger; Cervicalgia; Greater trochanteric bursitis of hip (Left); Other specified dorsopathies, sacral and sacrococcygeal region; Chronic hip pain (Left); Osteoarthritis of hip (Left); Abnormal MRI, lumbar spine (11/03/2019); Chronic sacroiliac pain (Bilateral); Osteoarthritis of sacroiliac joints (Bilateral) (HCC); and Chronic low back pain (Bilateral) w/o sciatica on their pertinent problem list. Pain Assessment: Severity of Chronic pain is reported as a 6 /10. Location: Back Left/pain radiaties down left leg to her toes. Onset: More than a month ago. Quality: Aching,Burning. Timing: Constant. Modifying factor(s): Med, laying down. Vitals:  height is '5\' 7"'  (1.702 m) and weight is 145 lb (65.8 kg). Her temperature is 98.7 F (37.1 C). Her blood pressure is 128/98 (abnormal) and her pulse is 67. Her oxygen saturation is 94%.   Reason for encounter: medication management.  The patient recently completed a "Drug Holiday".  Today she returns for evaluation and to determine what dose we need to put her back on.  In order to do that, I contacted the patient's granddaughter "Winfield Rast", who was present during our last visit and with familiar with the goal of the drug holiday.  She confirmed that the patient has been off of the medicine for at least 2 weeks.  The patient is in no acute distress and she is having some  discomfort according to the granddaughter, she has gone down on the amount of activity that she does because of the pain.  Today we spoke  about our goals in terms of getting her to be more active, but not to get sleepy.  The granddaughter confirmed that there were some concerns about the patient using too much medication.  There was also the issue of the patient getting more medicine that she was really using.  In the past she used to have prescriptions for 20 mg to be use 3 times daily, but she was actually using no more than twice a day.  In talking to Ms. La Palma, she indicated that she noticed her complaining to have gone up once she got to taking one 5 mg pill a day.  Based on this information, today I have decided to start the patient back on oxycodone IR 5 mg p.o. twice daily and will try traded up as needed, with the help of the patient's family and their feedback.  We have decided to provide the patient with a 30-day prescription and to see her back in 1 month for follow-up and further adjustment if needed.  I also told the patient's granddaughter that I would be more than glad to have a virtual appointment before that date, if needed.  She understood and accepted.  They also communicated to me that the patient has been having problems with her oxygen saturation and she is scheduled to see a specialist.  Currently she has an oxygen machine, which she left in the car today.  Today we have ordered a UDS to make sure that the patient does not have any opioids or any other related medications in the system.  I have lately noticed that the patient is having some difficulty remembering things and a peripheral example of this today was the fact that she asked me about the pain medication and I told her that I would be giving it to her twice a day.  She then asked about the milligrams and I told her not to concentrate on the amount of milligrams, but to answer her question I would be giving her the 5 mg pills.   Immediately, without having tried it, she said that she needed a lot more medicine.  This triggered me to go over the concept of drug holidays 1 was more with the patient.  I have also explained how the opioid narcotics could adversely affect her oxygen saturation and how the goal is to bring the pain down at least 40% but without having any side effects or oversedation.  She is 87 years old and I am concerned with her ability to walk and how this can also be worsened by the indiscriminate use of opioids.  She does have indications for the use of the pain medicine, but at the same time we have to be careful with the dosing since she is getting older, and her kidney and liver function are decreasing.  However, she is used to asking for more pain medicine and this is a problem because she is not asking for it out of need, but more so out of the habit.  For these reasons, it is more important than they were to have the feedback of the family as we adjust the medicine.  Going back to the issue of difficulty remembering things, after I had explained this to the patient, she asked me the same pain 2 additional times.  RTCB: 12/24/2020 Nonopioids transferred 06/04/2020: Requip, MiraLAX, Dulcolax, and melatonin  Pharmacotherapy Assessment   Analgesic: Oxycodone IR 20 mg, 1 tab PO q 8 hrs (60 mg/day of oxycodone) MME/day: 90 mg/day.  Monitoring: [place] PMP: PDMP reviewed during this encounter.       Pharmacotherapy: No side-effects or adverse reactions reported. Compliance: No problems identified. Effectiveness: Clinically acceptable.  Chauncey Fischer, RN  11/24/2020 12:34 PM  Sign when Signing Visit Nursing Pain Medication Assessment:  Safety precautions to be maintained throughout the outpatient stay will include: orient to surroundings, keep bed in low position, maintain call bell within reach at all times, provide assistance with transfer out of bed and ambulation.  Medication Inspection Compliance: Ms.  Arnett did not comply with our request to bring her pills to be counted. She was reminded that bringing the medication bottles, even when empty, is a requirement.  Medication: None brought in. Pill/Patch Count: None available to be counted. Bottle Appearance: No container available. Did not bring bottle(s) to appointment. Filled Date: N/A Last Medication intake:  Ran out of medicine more than 48 hours ago. Safety precautions to be maintained throughout the outpatient stay will include: orient to surroundings, keep bed in low position, maintain call bell within reach at all times, provide assistance with transfer out of bed and ambulation.     UDS:  Summary  Date Value Ref Range Status  02/19/2020 Note  Final    Comment:    ==================================================================== [employer] Select 13 (MW) ==================================================================== Test                             Result       Flag       Units  Drug Present and Declared for Prescription Verification   Oxycodone                      4108         EXPECTED   ng/mg creat   Oxymorphone                    1875         EXPECTED   ng/mg creat   Noroxycodone                   >7353        EXPECTED   ng/mg creat   Noroxymorphone                 835          EXPECTED   ng/mg creat    Sources of oxycodone are scheduled prescription medications.    Oxymorphone, noroxycodone, and noroxymorphone are expected    metabolites of oxycodone. Oxymorphone is also available as a    scheduled prescription medication.  ==================================================================== Test                      Result    Flag   Units      Ref Range   Creatinine              136              mg/dL      >=20 ==================================================================== Declared Medications:  The flagging and interpretation on this report are based on the  following declared medications.  Unexpected  results may arise from  inaccuracies in the declared medications.   **Note: The testing scope of this panel includes these medications:   Oxycodone   **Note: The testing scope of this panel does not include the  following reported medications:   Acetaminophen (Tylenol)  Albuterol  Apixaban (Eliquis)  Bisacodyl  Calcium  Furosemide (Lasix)  Helium  Levocetirizine (Xyzal)  Lisinopril (Zestril)  Melatonin  Oxygen  Polyethylene Glycol (MiraLAX)  Ropinirole (Requip)  Supplement  Vitamin D ==================================================================== For clinical consultation, please call [phone]. ====================================================================      ROS  Constitutional: Denies any fever or chills Gastrointestinal: No reported hemesis, hematochezia, vomiting, or acute GI distress Musculoskeletal: Denies any acute onset joint swelling, redness, loss of ROM, or weakness Neurological: No reported episodes of acute onset apraxia, aphasia, dysarthria, agnosia, amnesia, paralysis, loss of coordination, or loss of consciousness  Medication Review  Benefiber, Melatonin, Oxygen-Helium, Vitamin D, acetaminophen, albuterol, apixaban, aspirin EC, atorvastatin, bisacodyl, dextromethorphan-guaiFENesin, diclofenac Sodium, furosemide, levocetirizine, mupirocin ointment, oxyCODONE, polyethylene glycol, rOPINIRole, and solifenacin  History Review  Allergy: Ms. Minish is allergic to flexeril [cyclobenzaprine hcl] and vancomycin. Drug: Ms. Ressel  reports current drug use. Drug: Oxycodone. Alcohol:  reports no history of alcohol use. Tobacco:  reports that she quit smoking about 21 years ago. Her smoking use included cigarettes. She has a 50.00 pack-year smoking history. She has quit using smokeless tobacco. Social: Ms. Gartner  reports that she quit smoking about 21 years ago. Her smoking use included cigarettes. She has a 50.00 pack-year smoking history. She has  quit using smokeless tobacco. She reports current drug use. Drug: Oxycodone. She reports that she does not drink alcohol. Medical:  has a past medical history of Acute postoperative pain (02/09/2018), Arrhythmia, sinus node (04/03/2014), Arthritis, Arthritis, Atrial fibrillation ([place]), Back pain, Bradycardia, Breast cancer ([place]) (2004), Cancer ([place]) (2004), CHF (congestive heart failure) ([place]), Chronic back pain, CKD (chronic kidney disease), COPD (chronic obstructive pulmonary disease) ([place]), Cystocele, Decubitus ulcers, Dehydration, Gout, Hematuria, Hepatitis C, Hyperlipidemia, Hypertension, Hypomagnesemia (06/25/2015), OAB (overactive bladder), Overactive bladder, Restless leg, Shoulder pain, bilateral, Urinary frequency, UTI (lower urinary tract infection), and Vaginal atrophy. Surgical: Ms. Kotter  has a past surgical history that includes Abdominal hysterectomy; Back surgery; Rotator cuff repair; Breast surgery; Cataract extraction w/PHACO (10/19/2011); Cataract extraction w/PHACO (11/02/2011); Carpal tunnel release; Joint replacement; Colon surgery; Pacemaker insertion (N/A, 12/24/2015); Cholecystectomy; Breast lumpectomy (Right, 2004); Insert / replace / remove pacemaker; and Lower Extremity Angiography (Left, 10/14/2020). Family: family history includes Breast cancer in her paternal aunt; Breast cancer (age of onset: 1) in her sister; Cancer in her sister; Heart disease in her father and mother; Kidney disease in her brother; Stroke in her mother.  Laboratory Chemistry Profile   Renal Lab Results  Component Value Date   BUN 31 (H) 11/07/2020   CREATININE 1.43 (H) 11/07/2020   BCR 29 (H) 10/02/2020   GFRAA 52 (L) 10/02/2020   GFRNONAA 35 (L) 11/07/2020     Hepatic Lab Results  Component Value Date   AST 21 11/07/2020   ALT 16 11/07/2020   ALBUMIN 3.7 11/07/2020   ALKPHOS 55 11/07/2020     Electrolytes Lab Results  Component Value Date   NA 134 (L) 11/07/2020   K 4.2 11/07/2020   CL 108  11/07/2020   CALCIUM 9.0 11/07/2020   MG 2.0 11/07/2020   PHOS 3.0 08/05/2020     Bone Lab Results  Component Value Date   VD25OH 31 04/01/2020     Inflammation (CRP: Acute Phase) (ESR: Chronic Phase) Lab Results  Component Value Date   CRP <0.5 09/24/2015   ESRSEDRATE 12 09/24/2015   LATICACIDVEN 1.8 08/02/2020       Note: Above Lab results reviewed.  Recent Imaging Review  ABI WITH/WO TBI LOWER EXTREMITY DOPPLER STUDY  Comparison Study: 10/14/2020  Performing Technologist: Charlane Ferretti RT (R)(VS)    Examination Guidelines: A complete evaluation includes at minimum, Doppler waveform signals and systolic blood pressure reading at the level of bilateral brachial, anterior tibial, and posterior tibial arteries, when vessel segments are accessible. Bilateral testing is considered an integral part of a complete examination. Photoelectric Plethysmograph (PPG) waveforms and toe systolic pressure readings are included as required and additional duplex testing as needed. Limited examinations for reoccurring indications may be performed as noted.    ABI Findings: +---------+------------------+-----+----------+--------+ Right    Rt Pressure (mmHg)IndexWaveform  Comment  +---------+------------------+-----+----------+--------+ Brachial 122                                       +---------+------------------+-----+----------+--------+ ATA      101               0.83 monophasic         +---------+------------------+-----+----------+--------+ PTA      99                0.81 monophasic         +---------+------------------+-----+----------+--------+ Great Toe45                0.37 Abnormal           +---------+------------------+-----+----------+--------+  +---------+------------------+-----+----------+-------+ Left     Lt Pressure (mmHg)IndexWaveform  Comment +---------+------------------+-----+----------+-------+ Brachial 112                                       +---------+------------------+-----+----------+-------+ ATA      99                0.81 monophasic        +---------+------------------+-----+----------+-------+ PTA      98                0.80 monophasic        +---------+------------------+-----+----------+-------+ Great Toe75                0.61 Abnormal          +---------+------------------+-----+----------+-------+  +-------+-----------+-----------+------------+------------+ ABI/TBIToday's ABIToday's TBIPrevious ABIPrevious TBI +-------+-----------+-----------+------------+------------+ Right  .83        .37        .82         .60          +-------+-----------+-----------+------------+------------+ Left   .81        .61        .96         .61          +-------+-----------+-----------+------------+------------+  Right ABIs appear essentially unchanged compared to prior study on 10/14/2020. Left ABIs appear decreased compared to prior study on 10/14/2020. Bilateral TBI's appear essentially unchanged as compared to the previous exam on 10/14/2020.   Summary: Right: Resting right ankle-brachial index indicates mild right lower extremity arterial disease. The right toe-brachial index is abnormal.  Left: Resting left ankle-brachial index indicates mild left lower extremity arterial disease. The left toe-brachial index is abnormal.  *See table(s) above for measurements and observations.    Electronically signed by Hortencia Pilar MD on 11/10/2020 at 5:16:23 PM.    Final   VAS [name] LOWER EXTREMITY ARTERIAL DUPLEX LOWER EXTREMITY ARTERIAL DUPLEX STUDY   Vascular Interventions: 10/14/2020: PTA of the Left SFA and Popliteal Artery.  PTA of the Left Posterior tibial Artery and                         Tibioperoneal trunk. PTA and Stent placement Left EIA. Current ABI:            Not obtained  Performing Technologist: Almira Coaster RVS     Examination Guidelines: A complete evaluation includes B-mode imaging, spectral Doppler, color Doppler, and power Doppler as needed of all accessible portions of each vessel. Bilateral testing is considered an integral part of a complete examination. Limited examinations for reoccurring indications may be performed as noted.     +-----------+--------+-----+--------+--------+--------+ RIGHT      PSV cm/sRatioStenosisWaveformComments +-----------+--------+-----+--------+--------+--------+ ATA Distal 22                                    +-----------+--------+-----+--------+--------+--------+ PTA Distal 126                                   +-----------+--------+-----+--------+--------+--------+ PERO Distal10                                    +-----------+--------+-----+--------+--------+--------+     +-----------+--------+-----+--------+----------+------------+ LEFT       PSV cm/sRatioStenosisWaveform  Comments     +-----------+--------+-----+--------+----------+------------+ CFA Distal 84                   triphasic              +-----------+--------+-----+--------+----------+------------+ DFA        54                   triphasic              +-----------+--------+-----+--------+----------+------------+ SFA Prox   96                   triphasic              +-----------+--------+-----+--------+----------+------------+ SFA Mid    91                   triphasic patent stent +-----------+--------+-----+--------+----------+------------+ SFA Distal 64                   triphasic              +-----------+--------+-----+--------+----------+------------+ POP Distal 44                   triphasic              +-----------+--------+-----+--------+----------+------------+ ATA Distal 22                   monophasic             +-----------+--------+-----+--------+----------+------------+ PTA Distal 126                   triphasic              +-----------+--------+-----+--------+----------+------------+ PERO Distal10                   monophasic             +-----------+--------+-----+--------+----------+------------+     Summary: Left: Imaging and Waveforms obtained throughout in the Left Lower Extremity; The Left leg  stent appears to be patent.    See table(s) above for measurements and observations.  Electronically signed by Hortencia Pilar MD on 11/10/2020 at 5:16:00 PM.       Final   Note: Reviewed        Physical Exam  General appearance: Well nourished, well developed, and well hydrated. In no apparent acute distress Mental status: Alert, oriented x 3 (person, place, & time)       Respiratory: No evidence of acute respiratory distress Eyes: PERLA Vitals: BP (!) 128/98   Pulse 67   Temp 98.7 F (37.1 C)   Ht '5\' 7"'  (1.702 m)   Wt 145 lb (65.8 kg)   SpO2 94%   BMI 22.71 kg/m  BMI: Estimated body mass index is 22.71 kg/m as calculated from the following:   Height as of this encounter: '5\' 7"'  (1.702 m).   Weight as of this encounter: 145 lb (65.8 kg). Ideal: Ideal body weight: 61.6 kg (135 lb 12.9 oz) Adjusted ideal body weight: 63.3 kg (139 lb 7.7 oz)  Assessment   Status Diagnosis  Worsened Increased Increased secondary to the later portion of the drug holiday. 1. Chronic pain syndrome   2. Chronic low back pain (1ry area of Pain) (Bilateral) (L>R) w/ sciatica (Bilateral)   3. Chronic lower extremity pain (2ry area of Pain) (Bilateral) (L>R)   4. Chronic cervical radicular pain (3ry area of Pain) (Bilateral) (L>R)   5. Cervicalgia   6. Pharmacologic therapy   7. Chronic use of opiate for therapeutic purpose   8. Uncomplicated opioid dependence ([place])   9. Long term prescription opiate use      Updated Problems: Problem  Chronic Use of Opiate for Therapeutic Purpose    Plan of Care  Problem-specific:  No problem-specific Assessment & Plan  notes found for this encounter.  Ms. ALIYYAH RIESE has a current medication list which includes the following long-term medication(s): apixaban, atorvastatin, bisacodyl, furosemide, levocetirizine, oxycodone, polyethylene glycol, ropinirole, albuterol, and benefiber.  Pharmacotherapy (Medications Ordered): Meds ordered this encounter  Medications  . oxyCODONE (OXY IR/ROXICODONE) 5 MG immediate release tablet    Sig: Take 1 tablet (5 mg total) by mouth 2 (two) times daily as needed for severe pain. Must last 30 days    Dispense:  60 tablet    Refill:  0    Not a duplicate. Do NOT delete! Chronic Pain: STOP Act NOT applicable. Fill 1 day early if closed on refill date. Avoid benzodiazepines within 8 hours of opioids. Do not send refill requests.   Orders:  Orders Placed This Encounter  Procedures  . Tox[employer]SURE Select 13 (MW), Urine    Volume: 30 ml(s). Minimum 3 ml of urine is needed. Document temperature of fresh sample. Indications: Long term (current) use of opiate analgesic (T02.409)    Order Specific Question:   Release to patient    Answer:   Immediate   Follow-up plan:   Return in about 1 month (around 12/24/2020) for (F2F), (MM).      Interventional Therapies  Risk  Complexity Considerations:   NOTE: ELIQUIS ANTICOAGULATION (Stop: x3 days prior to procedure  Restart 6 hours after procedures)   Planned  Pending:   Pending further evaluation   Under consideration:   Diagnostic right SI joint block Possible bilateral SI joint RFA Diagnostic bilateral femoral nerve + obturator NB Possible bilateral femoral nerve + obturator nerve RFA Diagnostic right IA shoulder joint injection   Completed:   Diagnostic/therapeutic right L2  TFESI x1 (01/31/2020)  Diagnostic/therapeutic left L4 TFESI x1 (01/31/2020) Diagnostic left SI joint block x2 (09/27/2019) Palliative left CESI x2 (10/13/2017) Palliative left trochanteric bursa injection x1 (03/27/2019)  Palliative right IA hip inj.  x1  (100% on the right) (02/08/2019) Palliative left IA hip inj. x2 (02/08/2019) Palliative right lumbar facet MBB x2(01/10/2020) Palliative left lumbar facet MBB x4(01/10/2020) Palliative right lumbar facet RFA x1 (09/19/18) Palliative left lumbar facet RFA x3 (08/01/18) Diagnostic/therapeutic right suprascapular NB x3 (05/10/2017)  Palliative bilateral suprascapular nerve RFA x1(07/13/18)   Therapeutic  Palliative (PRN) options:   Diagnostic left SI joint block #2  Palliative left CESI #3  Palliative left trochanteric bursa injection #2  Palliative right IA hip inj. #2  (100% on the right) Palliative left IA hip inj. #3   Palliative right lumbar facet block #3 Palliative left lumbar facet block #5 Palliative right lumbar facet RFA #2  Palliative left lumbar facet RFA #4  Palliative bilateral suprascapular nerve block Palliative bilateral suprascapular nerve RFA #2     Recent Visits Date Type Provider Dept  10/20/20 Office Visit Milinda Pointer, MD [facility]  10/02/20 Office Visit Milinda Pointer, MD [facility]  09/30/20 Procedure visit Milinda Pointer, MD [facility] [facility]  09/17/20 Office Visit Milinda Pointer, MD [facility]  Showing recent visits within past 90 days and meeting all other requirements Today's Visits Date Type Provider Dept  11/24/20 Office Visit Milinda Pointer, MD [facility]  Showing today's visits and meeting all other requirements Future Appointments Date Type Provider Dept  01/07/21 Appointment Milinda Pointer, MD [facility]  Showing future appointments within next 90 days and meeting all other requirements  I discussed the assessment and treatment plan with the patient. The patient was provided an opportunity to ask questions and all were answered. The patient agreed with the plan and demonstrated an understanding of the instructions.  Patient advised to call back or seek  an in-person evaluation if the symptoms or condition worsens.  Duration of encounter: 30 minutes.  Note by: Gaspar Cola, MD Date: 11/24/2020; Time: 3:14 PM

## 2020-11-24 ENCOUNTER — Encounter: Payer: Self-pay | Admitting: Pain Medicine

## 2020-11-24 ENCOUNTER — Ambulatory Visit: Payer: Medicare HMO | Attending: Pain Medicine | Admitting: Pain Medicine

## 2020-11-24 ENCOUNTER — Other Ambulatory Visit: Payer: Self-pay

## 2020-11-24 VITALS — BP 128/98 | HR 67 | Temp 98.7°F | Ht 67.0 in | Wt 145.0 lb

## 2020-11-24 DIAGNOSIS — G894 Chronic pain syndrome: Secondary | ICD-10-CM | POA: Diagnosis not present

## 2020-11-24 DIAGNOSIS — M5442 Lumbago with sciatica, left side: Secondary | ICD-10-CM | POA: Diagnosis not present

## 2020-11-24 DIAGNOSIS — M5441 Lumbago with sciatica, right side: Secondary | ICD-10-CM | POA: Diagnosis not present

## 2020-11-24 DIAGNOSIS — M542 Cervicalgia: Secondary | ICD-10-CM

## 2020-11-24 DIAGNOSIS — Z79899 Other long term (current) drug therapy: Secondary | ICD-10-CM

## 2020-11-24 DIAGNOSIS — F1120 Opioid dependence, uncomplicated: Secondary | ICD-10-CM | POA: Insufficient documentation

## 2020-11-24 DIAGNOSIS — M5412 Radiculopathy, cervical region: Secondary | ICD-10-CM | POA: Diagnosis not present

## 2020-11-24 DIAGNOSIS — M79604 Pain in right leg: Secondary | ICD-10-CM

## 2020-11-24 DIAGNOSIS — G8929 Other chronic pain: Secondary | ICD-10-CM | POA: Diagnosis not present

## 2020-11-24 DIAGNOSIS — M79605 Pain in left leg: Secondary | ICD-10-CM

## 2020-11-24 DIAGNOSIS — Z79891 Long term (current) use of opiate analgesic: Secondary | ICD-10-CM | POA: Diagnosis not present

## 2020-11-24 MED ORDER — OXYCODONE HCL 5 MG PO TABS: 5.0000 mg | ORAL_TABLET | Freq: Two times a day (BID) | ORAL | 0 refills | Status: DC | PRN

## 2020-11-24 NOTE — Patient Instructions (Signed)
____________________________________________________________________________________________  Medication Rules  Purpose: To inform patients, and their family members, of our rules and regulations.  Applies to: All patients receiving prescriptions (written or electronic).  Pharmacy of record: Pharmacy where electronic prescriptions will be sent. If written prescriptions are taken to a different pharmacy, please inform the nursing staff. The pharmacy listed in the electronic medical record should be the one where you would like electronic prescriptions to be sent.  Electronic prescriptions: In compliance with the [place] (STOP) Act of 2017 (Session Lanny Cramp [phone]), effective August 23, 2018, all controlled substances must be electronically prescribed. Calling prescriptions to the pharmacy will cease to exist.  Prescription refills: Only during scheduled appointments. Applies to all prescriptions.  NOTE: The following applies primarily to controlled substances (Opioid* Pain Medications).   Type of encounter (visit): For patients receiving controlled substances, face-to-face visits are required. (Not an option or up to the patient.)  Patient's responsibilities: 1. Pain Pills: Bring all pain pills to every appointment (except for procedure appointments). 2. Pill Bottles: Bring pills in original pharmacy bottle. Always bring the newest bottle. Bring bottle, even if empty. 3. Medication refills: You are responsible for knowing and keeping track of what medications you take and those you need refilled. The day before your appointment: write a list of all prescriptions that need to be refilled. The day of the appointment: give the list to the admitting nurse. Prescriptions will be written only during appointments. No prescriptions will be written on procedure days. If you forget a medication: it will not be "Called in", "Faxed", or "electronically sent".  You will need to get another appointment to get these prescribed. No early refills. Do not call asking to have your prescription filled early. 4. Prescription Accuracy: You are responsible for carefully inspecting your prescriptions before leaving our office. Have the discharge nurse carefully go over each prescription with you, before taking them home. Make sure that your name is accurately spelled, that your address is correct. Check the name and dose of your medication to make sure it is accurate. Check the number of pills, and the written instructions to make sure they are clear and accurate. Make sure that you are given enough medication to last until your next medication refill appointment. 5. Taking Medication: Take medication as prescribed. When it comes to controlled substances, taking less pills or less frequently than prescribed is permitted and encouraged. Never take more pills than instructed. Never take medication more frequently than prescribed.  6. Inform other Doctors: Always inform, all of your healthcare providers, of all the medications you take. 7. Pain Medication from other Providers: You are not allowed to accept any additional pain medication from any other Doctor or Healthcare provider. There are two exceptions to this rule. (see below) In the event that you require additional pain medication, you are responsible for notifying us, as stated below. 8. Cough Medicine: Often these contain an opioid, such as codeine or hydrocodone. Never accept or take cough medicine containing these opioids if you are already taking an opioid* medication. The combination may cause respiratory failure and death. 9. Medication Agreement: You are responsible for carefully reading and following our Medication Agreement. This must be signed before receiving any prescriptions from our practice. Safely store a copy of your signed Agreement. Violations to the Agreement will result in no further prescriptions.  (Additional copies of our Medication Agreement are available upon request.) 10. Laws, Rules, & Regulations: All patients are expected to follow all  Federal and [employer], [employer]. Ignorance of the Laws does not constitute a valid excuse.  11. Illegal drugs and Controlled Substances: The use of illegal substances (including, but not limited to marijuana and its derivatives) and/or the illegal use of any controlled substances is strictly prohibited. Violation of this rule may result in the immediate and permanent discontinuation of any and all prescriptions being written by our practice. The use of any illegal substances is prohibited. 12. Adopted CDC guidelines & recommendations: Target dosing levels will be at or below 60 MME/day. Use of benzodiazepines** is not recommended.  Exceptions: There are only two exceptions to the rule of not receiving pain medications from other Healthcare Providers. 1. Exception #1 (Emergencies): In the event of an emergency (i.e.: accident requiring emergency care), you are allowed to receive additional pain medication. However, you are responsible for: As soon as you are able, call our office [phone], at any time of the day or night, and leave a message stating your name, the date and nature of the emergency, and the name and dose of the medication prescribed. In the event that your call is answered by a member of our staff, make sure to document and save the date, time, and the name of the person that took your information.  2. Exception #2 (Planned Surgery): In the event that you are scheduled by another doctor or dentist to have any type of surgery or procedure, you are allowed (for a period no longer than 30 days), to receive additional pain medication, for the acute post-op pain. However, in this case, you are responsible for picking up a copy of our "Post-op Pain Management for Surgeons" handout, and giving it to your surgeon or dentist. This  document is available at our office, and does not require an appointment to obtain it. Simply go to our office during business hours (Monday-Thursday from 8:00 AM to 4:00 PM) (Friday 8:00 AM to 12:00 Noon) or if you have a scheduled appointment with [name], prior to your surgery, and ask for it by name. In addition, you are responsible for: calling our office [phone], at any time of the day or night, and leaving a message stating your name, name of your surgeon, type of surgery, and date of procedure or surgery. Failure to comply with your responsibilities may result in termination of therapy involving the controlled substances.  *Opioid medications include: morphine, codeine, oxycodone, oxymorphone, hydrocodone, hydromorphone, meperidine, tramadol, tapentadol, buprenorphine, fentanyl, methadone. **Benzodiazepine medications include: diazepam (Valium), alprazolam (Xanax), clonazepam (Klonopine), lorazepam (Ativan), clorazepate (Tranxene), chlordiazepoxide (Librium), estazolam (Prosom), oxazepam (Serax), temazepam (Restoril), triazolam (Halcion) (Last updated: 07/21/2020) ____________________________________________________________________________________________

## 2020-11-24 NOTE — Progress Notes (Signed)
Nursing Pain Medication Assessment:  Safety precautions to be maintained throughout the outpatient stay will include: orient to surroundings, keep bed in low position, maintain call bell within reach at all times, provide assistance with transfer out of bed and ambulation.  Medication Inspection Compliance: Kristin [name] did not comply with our request to bring her pills to be counted. She was reminded that bringing the medication bottles, even when empty, is a requirement.  Medication: None brought in. Pill/Patch Count: None available to be counted. Bottle Appearance: No container available. Did not bring bottle(s) to appointment. Filled Date: N/A Last Medication intake:  Ran out of medicine more than 48 hours ago. Safety precautions to be maintained throughout the outpatient stay will include: orient to surroundings, keep bed in low position, maintain call bell within reach at all times, provide assistance with transfer out of bed and ambulation.

## 2020-11-26 DIAGNOSIS — H26491 Other secondary cataract, right eye: Secondary | ICD-10-CM | POA: Diagnosis not present

## 2020-11-29 LAB — TOXASSURE SELECT 13 (MW), URINE

## 2020-12-02 DIAGNOSIS — I495 Sick sinus syndrome: Secondary | ICD-10-CM | POA: Diagnosis not present

## 2020-12-03 ENCOUNTER — Other Ambulatory Visit: Payer: Self-pay | Admitting: Specialist

## 2020-12-03 ENCOUNTER — Ambulatory Visit: Payer: Self-pay | Admitting: *Deleted

## 2020-12-03 DIAGNOSIS — E119 Type 2 diabetes mellitus without complications: Secondary | ICD-10-CM | POA: Diagnosis not present

## 2020-12-03 DIAGNOSIS — J849 Interstitial pulmonary disease, unspecified: Secondary | ICD-10-CM

## 2020-12-03 DIAGNOSIS — R0602 Shortness of breath: Secondary | ICD-10-CM

## 2020-12-03 DIAGNOSIS — Z9981 Dependence on supplemental oxygen: Secondary | ICD-10-CM | POA: Diagnosis not present

## 2020-12-03 DIAGNOSIS — J449 Chronic obstructive pulmonary disease, unspecified: Secondary | ICD-10-CM | POA: Diagnosis not present

## 2020-12-03 NOTE — Telephone Encounter (Signed)
I agree with triage. She has a complicated situation because she has CHF, and when she was taking fluid pills too often she became dehydrated and was in hospital.  She needs to follow-up closely with [facility] regarding swelling / fluid / heart. Main goal is to avoid re hospitalization. They may need to make adjustment to her regimen.  I saw her last in a hospital follow-up.  Her next apt with Cardiology Dr Saralyn Pilar at [place] is not until 01/29/21  She likely needs a sooner apt with them as well  [name], [employer] 12/03/2020, 11:26 AM

## 2020-12-03 NOTE — Telephone Encounter (Addendum)
I called the patient to notify her of Dr. Raliegh Ip recommendation. She informed me that Dr. Raul Del, Pulmonologist looked at her legs and didn't seem to be concern because they were not really swollen today. Dr. Raul Del recommended that she elevate her feet when she is resting. She informed me that the SOB is chronic not any different from her usual SOB. I instructed the patient if her symptoms worsen or persist that Dr. Raliegh Ip recommended that you call Dr. Saralyn Pilar and request a sooner appointment. She verbalize understanding.

## 2020-12-03 NOTE — Telephone Encounter (Signed)
I called the patient to f/u on the recent triage not. Pt called with complaint os feet and leg swelling x 3 days; Swelling is from her calf to her feet;the swelling does not improve with elevation; she also has difficulty walking due to foot pain rated 5 out 10; the pt has CHF but she has not noticed a change in her weight; she has shortness of breath when she does too much. I recommended that the patient seek emergent care to elevate her new onset symptoms. She informed me that she has an appt with Dr. Raul Del Pulmonologist at [facility] today at 11:00am.

## 2020-12-03 NOTE — Telephone Encounter (Signed)
Pt called with complaint os feet and leg swelling x 3 days; the swelling is from her calf to her feet;the swelling does not improve with elevation; she also has difficulty walking due to foot pain rated 5 out 10; the pt has CHF but she has not noticed a change in her weight; she has shortness of breath when she does too much; recommendations made per nurse triage protocol; she verbalized understanding; the pt sees Dr Parks Ranger at [facility]; he has no availability within protocol guidelines; pt notified and can be contacted at [phone]; will route to office for scheduling.  Reason for Disposition . [1] MODERATE leg swelling (e.g., swelling extends up to knees) AND [2] new-onset or worsening  Answer Assessment - Initial Assessment Questions 1. ONSET: "When did the swelling start?" (e.g., minutes, hours, days)     11/30/20 2. LOCATION: "What part of the leg is swollen?"  "Are both legs swollen or just one leg?"    Bilateral legs from calf to feet 3. SEVERITY: "How bad is the swelling?" (e.g., localized; mild, moderate, severe)  - Localized - small area of swelling localized to one leg  - MILD pedal edema - swelling limited to foot and ankle, pitting edema < 1/4 inch (6 mm) deep, rest and elevation eliminate most or all swelling  - MODERATE edema - swelling of lower leg to knee, pitting edema > 1/4 inch (6 mm) deep, rest and elevation only partially reduce swelling  - SEVERE edema - swelling extends above knee, facial or hand swelling present      moderate 4. REDNESS: "Does the swelling look red or infected?"   no 5. PAIN: "Is the swelling painful to touch?" If Yes, ask: "How painful is it?"   (Scale 1-10; mild, moderate or severe)     Foot pain rated 5 out of 10 6. FEVER: "Do you have a fever?" If Yes, ask: "What is it, how was it measured, and when did it start?"      no 7. CAUSE: "What do you think is causing the leg swelling?"     8. MEDICAL HISTORY: "Do you have a history of  heart failure, kidney disease, liver failure, or cancer?"    chf 9. RECURRENT SYMPTOM: "Have you had leg swelling before?" If Yes, ask: "When was the last time?" "What happened that time?"    1 month ago; legs went down elevation 10. OTHER SYMPTOMS: "Do you have any other symptoms?" (e.g., chest pain, difficulty breathing)     Shortness of breath with exertion 11. PREGNANCY: "Is there any chance you are pregnant?" "When was your last menstrual period?"      no  Protocols used: LEG SWELLING AND EDEMA-A-AH

## 2020-12-09 DIAGNOSIS — J45998 Other asthma: Secondary | ICD-10-CM | POA: Diagnosis not present

## 2020-12-10 DIAGNOSIS — H26492 Other secondary cataract, left eye: Secondary | ICD-10-CM | POA: Diagnosis not present

## 2020-12-11 DIAGNOSIS — J45998 Other asthma: Secondary | ICD-10-CM | POA: Diagnosis not present

## 2020-12-16 ENCOUNTER — Ambulatory Visit: Payer: Medicare HMO

## 2020-12-19 ENCOUNTER — Other Ambulatory Visit: Payer: Self-pay | Admitting: Family Medicine

## 2020-12-19 DIAGNOSIS — G589 Mononeuropathy, unspecified: Secondary | ICD-10-CM

## 2020-12-19 NOTE — Telephone Encounter (Signed)
Requested medication (s) are due for refill today: ?  Requested medication (s) are on the active medication list: No  Last refill:  ?  Future visit scheduled: Yes  Notes to clinic:  Medication not on list.    Requested Prescriptions  Pending Prescriptions Disp Refills   gabapentin (NEURONTIN) 100 MG capsule [Pharmacy Med Name: GABAPENTIN 100 MG CAPSULE] 90 capsule 11    Sig: TAKE 1 CAPSULE BY MOUTH ONCE DAILY THEN INCREASE EVERY 2 TO 3 DAYS UP TO TAKING THREE TIMES DAILY OR 3 CAPSULES AT BEDTIME      Neurology: Anticonvulsants - gabapentin Passed - 12/19/2020  2:17 PM      Passed - Valid encounter within last 12 months    Recent Outpatient Visits           1 month ago Chronic respiratory failure with hypoxia [facility])   [place], DO   2 months ago Benign hypertension with CKD (chronic kidney disease) stage III [facility])   [place], Devonne Doughty, DO   4 months ago Community acquired pneumonia of right lower lobe of lung   [place], DO   4 months ago Bilateral lower extremity edema   [place], DO   5 months ago Varicose veins of left lower extremity with inflammation, with ulcer of ankle limited to breakdown of skin [facility])   [facility] Parks Ranger, Devonne Doughty, DO       Future Appointments             In 3 months Parks Ranger, Devonne Doughty, [facility]

## 2020-12-23 ENCOUNTER — Other Ambulatory Visit (INDEPENDENT_AMBULATORY_CARE_PROVIDER_SITE_OTHER): Payer: Self-pay | Admitting: Vascular Surgery

## 2020-12-23 DIAGNOSIS — I739 Peripheral vascular disease, unspecified: Secondary | ICD-10-CM

## 2020-12-24 ENCOUNTER — Other Ambulatory Visit: Payer: Self-pay

## 2020-12-24 ENCOUNTER — Ambulatory Visit (INDEPENDENT_AMBULATORY_CARE_PROVIDER_SITE_OTHER): Payer: Medicare HMO | Admitting: Nurse Practitioner

## 2020-12-24 ENCOUNTER — Ambulatory Visit (INDEPENDENT_AMBULATORY_CARE_PROVIDER_SITE_OTHER): Payer: Medicare HMO

## 2020-12-24 VITALS — BP 148/70 | HR 84 | Ht 67.0 in | Wt 146.0 lb

## 2020-12-24 DIAGNOSIS — I739 Peripheral vascular disease, unspecified: Secondary | ICD-10-CM

## 2020-12-24 DIAGNOSIS — I7025 Atherosclerosis of native arteries of other extremities with ulceration: Secondary | ICD-10-CM

## 2020-12-24 DIAGNOSIS — I129 Hypertensive chronic kidney disease with stage 1 through stage 4 chronic kidney disease, or unspecified chronic kidney disease: Secondary | ICD-10-CM | POA: Diagnosis not present

## 2020-12-24 DIAGNOSIS — E785 Hyperlipidemia, unspecified: Secondary | ICD-10-CM

## 2020-12-24 DIAGNOSIS — N183 Chronic kidney disease, stage 3 unspecified: Secondary | ICD-10-CM | POA: Diagnosis not present

## 2020-12-24 MED ORDER — LIDOCAINE 5 % EX OINT: 1.0000 "application " | TOPICAL_OINTMENT | Freq: Every day | CUTANEOUS | 0 refills | Status: DC | PRN

## 2020-12-25 ENCOUNTER — Telehealth: Payer: Self-pay | Admitting: Pain Medicine

## 2020-12-26 ENCOUNTER — Encounter: Payer: Self-pay | Admitting: Pain Medicine

## 2020-12-26 NOTE — Telephone Encounter (Signed)
Spoke with daughter.  INformed her that I will schedule patient for a virtual visit on MOnday at 4pm.  Informed her of UDS result.  Daughter states she will investigate.  Dr Dossie Arbour notified.

## 2020-12-28 NOTE — Progress Notes (Signed)
Patient: Kristin [name]  Service Category: E/M  Provider: Gaspar Cola, MD  DOB: October 28, 1929  DOS: 12/29/2020  Location: Office  MRN: [id number]  Setting: Ambulatory outpatient  Referring Provider: [name] *  Type: Established Patient  Specialty: Interventional Pain Management  PCP: Olin Hauser, DO  Location: Remote location  Delivery: TeleHealth     Virtual Encounter - Pain Management PROVIDER NOTE: Information contained herein reflects review and annotations entered in association with encounter. Interpretation of such information and data should be left to medically-trained personnel. Information provided to patient can be located elsewhere in the medical record under "Patient Instructions". Document created using STT-dictation technology, any transcriptional errors that may result from process are unintentional.    Contact & Pharmacy Preferred: [phone] Home: [phone] (home) Mobile: There is no such number on file (mobile). E-mail: [email or web address] .[email or web address], [place] - [address] [place] [address] Phone: [phone] Fax: [phone]   Pre-screening  Kristin [name] offered "in-person" vs "virtual" encounter. She indicated preferring virtual for this encounter.   Reason COVID-19*  Social distancing based on CDC and AMA recommendations.   I contacted Kristin [name] on 12/29/2020 via telephone.      I clearly identified myself as Gaspar Cola, MD. I verified that I was speaking with the correct person using two identifiers (Name: Kristin [name], and date of birth: 05/21/1930).  Consent I sought verbal advanced consent from Kristin [name] for virtual visit interactions. I informed Kristin [name] of possible security and privacy concerns, risks, and limitations associated with providing "not-in-person" medical evaluation and management services. I also informed Kristin [name] of the availability of  "in-person" appointments. Finally, I informed her that there would be a charge for the virtual visit and that she could be  personally, fully or partially, financially responsible for it. Kristin [name] expressed understanding and agreed to proceed.   Historic Elements   Kristin [name] is a 87 y.o. year old, female patient evaluated today after our last contact on 12/25/2020. Kristin [name]  has a past medical history of Acute postoperative pain (02/09/2018), Arrhythmia, sinus node (04/03/2014), Arthritis, Arthritis, Atrial fibrillation ([place]), Back pain, Bradycardia, Breast cancer ([place]) (2004), Cancer ([place]) (2004), CHF (congestive heart failure) ([place]), Chronic back pain, CKD (chronic kidney disease), COPD (chronic obstructive pulmonary disease) ([place]), Cystocele, Decubitus ulcers, Dehydration, Gout, Hematuria, Hepatitis C, Hyperlipidemia, Hypertension, Hypomagnesemia (06/25/2015), OAB (overactive bladder), Overactive bladder, Restless leg, Shoulder pain, bilateral, Urinary frequency, UTI (lower urinary tract infection), and Vaginal atrophy. She also  has a past surgical history that includes Abdominal hysterectomy; Back surgery; Rotator cuff repair; Breast surgery; Cataract extraction w/PHACO (10/19/2011); Cataract extraction w/PHACO (11/02/2011); Carpal tunnel release; Joint replacement; Colon surgery; Pacemaker insertion (N/A, 12/24/2015); Cholecystectomy; Breast lumpectomy (Right, 2004); Insert / replace / remove pacemaker; and Lower Extremity Angiography (Left, 10/14/2020). Kristin [name] has a current medication list which includes the following prescription(s): acetaminophen, apixaban, aspirin ec, atorvastatin, bisacodyl, vitamin d, dextromethorphan-guaifenesin, diclofenac sodium, furosemide, gabapentin, levocetirizine, lidocaine, lisinopril, melatonin, mupirocin ointment, oxycodone hcl, oxygen-helium, polyethylene glycol, ropinirole, solifenacin, stiolto respimat, anoro ellipta, albuterol, and benefiber. She  reports that she  quit smoking about 22 years ago. Her smoking use included cigarettes. She has a 50.00 pack-year smoking history. She has quit using smokeless tobacco. She reports current drug use. Drug: Oxycodone. She reports that she does not drink alcohol. Kristin [name] is allergic to flexeril [cyclobenzaprine hcl] and vancomycin.   HPI  Today, she is  being contacted for medication management.  Abnormal 11/24/2020 UDS, positive for buprenorphine.  PMP does not show a prescriber for the buprenorphine.  This could explain the issues with the patient becoming disoriented.  The UDS also states that the patient had no oxycodone, but inaccurately describes it as "unexpected".  This is not correct since the patient was at the tail end of a "Drug Holiday".  The fact of the matter is that I was expecting the patient not to have oxycodone.  The presence of oxycodone would have been an expected.  We contacted the patient's caretaker (daughter) and we informed her of the findings.  She indicated to [name] that the patient's brother is living with her and apparently had some recent surgery.  I think that he made a shared some medications with her.  Today I had a conversation with the patient's daughter "Kristin [name]" [phone].  We talked about the oxycodone IR 5 mg twice a day and how that was helping with the pain.  She indicated that 20 minutes after taking the 5 mg pill, she still complaining of pain and it does not seem that it is making her too sleepy.  In view of this, today we will go ahead and increase it to the 10 mg pill and we will again give her 1 twice daily.  This time, we will keep an eye on how long the effect for these pills is, so as to determine if she needs a twice daily or 3 times daily dosing.  Kristin [name] indicates that the buprenorphine came from the patient's son who is an amputee and apparently has been getting some pain medicine.  She also indicated having had a long conversation with him regarding the medicine and how he  is not to give her any of his medicine since it could trigger some serious problems.  She will continue to monitor her mother's medication use and she agrees that we should definitely not go to the 20 mg pill.  RTCB: 01/28/2021 Nonopioids transferred 06/04/2020: Requip, MiraLAX, Dulcolax, and melatonin  Pharmacotherapy Assessment  Analgesic: Oxycodone IR 10 mg, 1 tab PO BID (20 mg/day of oxycodone) MME/day: 30 mg/day.   Monitoring: [place] PMP: PDMP reviewed during this encounter.       Pharmacotherapy: No side-effects or adverse reactions reported. Compliance: No problems identified. Effectiveness: Clinically acceptable. Plan: Refer to "POC".  UDS:  Summary  Date Value Ref Range Status  11/24/2020 Note  Final    Comment:    ==================================================================== [employer] Select 13 (MW) ==================================================================== Test                             Result       Flag       Units  Drug Present not Declared for Prescription Verification   Buprenorphine                  180          UNEXPECTED ng/mg creat   Norbuprenorphine               108          UNEXPECTED ng/mg creat    Source of buprenorphine is a scheduled prescription medication.    Norbuprenorphine is an expected metabolite of buprenorphine.  Drug Absent but Declared for Prescription Verification   Oxycodone  Not Detected UNEXPECTED ng/mg creat ==================================================================== Test                      Result    Flag   Units      Ref Range   Creatinine              120              mg/dL      >=20 ==================================================================== Declared Medications:  The flagging and interpretation on this report are based on the  following declared medications.  Unexpected results may arise from  inaccuracies in the declared medications.   **Note: The testing scope of this panel  includes these medications:   Oxycodone   **Note: The testing scope of this panel does not include the  following reported medications:   Acetaminophen  Albuterol  Apixaban  Aspirin  Atorvastatin  Bisacodyl  Dextromethorphan (Mucinex DM)  Furosemide  Guaifenesin (Mucinex DM)  Helium  Levocetirizine  Melatonin  Oxygen  Polyethylene Glycol  Ropinirole  Solifenacin  Supplement  Topical  Topical Diclofenac  Vitamin D ==================================================================== For clinical consultation, please call [phone]. ====================================================================     Laboratory Chemistry Profile   Renal Lab Results  Component Value Date   BUN 31 (H) 11/07/2020   CREATININE 1.43 (H) 11/07/2020   BCR 29 (H) 10/02/2020   GFRAA 52 (L) 10/02/2020   GFRNONAA 35 (L) 11/07/2020     Hepatic Lab Results  Component Value Date   AST 21 11/07/2020   ALT 16 11/07/2020   ALBUMIN 3.7 11/07/2020   ALKPHOS 55 11/07/2020     Electrolytes Lab Results  Component Value Date   NA 134 (L) 11/07/2020   K 4.2 11/07/2020   CL 108 11/07/2020   CALCIUM 9.0 11/07/2020   MG 2.0 11/07/2020   PHOS 3.0 08/05/2020     Bone Lab Results  Component Value Date   VD25OH 31 04/01/2020     Inflammation (CRP: Acute Phase) (ESR: Chronic Phase) Lab Results  Component Value Date   CRP <0.5 09/24/2015   ESRSEDRATE 12 09/24/2015   LATICACIDVEN 1.8 08/02/2020       Note: Above Lab results reviewed.  Imaging  VAS [name] LOWER EXTREMITY ARTERIAL DUPLEX LOWER EXTREMITY ARTERIAL DUPLEX STUDY  Patient Name:  CATHERINE CUBERO  Date of Exam:   12/24/2020 Medical Rec #: [id number]      Accession #:    [id number] Date of Birth: Nov 16, 1929       Patient Gender: F Patient Age:   35Y Exam Location:  [facility] Procedure:      VAS [name] LOWER EXTREMITY ARTERIAL DUPLEX Referring Phys: [id number] [place]  --------------------------------------------------------------------------------     Vascular Interventions: 10/14/2020: PTA of the Left SFA and Popliteal Artery.                         PTA of the Left Posterior tibial Artery and                         Tibioperoneal trunk. PTA and Stent placement Left EIA. Current ABI:            Rt= .94 Lt=1.05  Comparison Study: 11/05/2020  Performing Technologist: Charlane Ferretti RT (R)(VS)    Examination Guidelines: A complete evaluation includes B-mode imaging, spectral Doppler, color Doppler, and power Doppler as needed of all accessible portions of each  vessel. Bilateral testing is considered an integral part of a complete examination. Limited examinations for reoccurring indications may be performed as note    +----------+--------+-----+--------+----------+--------+ LEFT      PSV cm/sRatioStenosisWaveform  Comments +----------+--------+-----+--------+----------+--------+ CFA Distal109                  triphasic          +----------+--------+-----+--------+----------+--------+ DFA       163                  triphasic          +----------+--------+-----+--------+----------+--------+ SFA Prox  105                  triphasic          +----------+--------+-----+--------+----------+--------+ SFA Mid   146                  triphasic Stent    +----------+--------+-----+--------+----------+--------+ SFA Distal108                  triphasic          +----------+--------+-----+--------+----------+--------+ POP Prox  68                   biphasic           +----------+--------+-----+--------+----------+--------+ POP Distal49                   biphasic           +----------+--------+-----+--------+----------+--------+ ATA Distal45                   monophasic         +----------+--------+-----+--------+----------+--------+ PTA Prox  68                   monophasic          +----------+--------+-----+--------+----------+--------+    Summary: Left: Patent stent with no evidence of stenosis in the mid SFA artery.  See table(s) above for measurements and observations.  Electronically signed by Hortencia Pilar MD on 12/25/2020 at 5:10:39 PM.       Final   VAS [name] ABI WITH/WO TBI  LOWER EXTREMITY DOPPLER STUDY  Patient Name:  ANDJELA WICKES  Date of Exam:   12/24/2020 Medical Rec #: [id number]      Accession #:    [id number] Date of Birth: November 26, 1929       Patient Gender: F Patient Age:   72Y Exam Location:  [facility] Procedure:      VAS [name] ABI WITH/WO TBI Referring Phys: [id number] [place]  --------------------------------------------------------------------------------     Comparison Study: 11/03/2020  Performing Technologist: Charlane Ferretti RT (R)(VS)    Examination Guidelines: A complete evaluation includes at minimum, Doppler waveform signals and systolic blood pressure reading at the level of bilateral brachial, anterior tibial, and posterior tibial arteries, when vessel segments are accessible. Bilateral testing is considered an integral part of a complete examination. Photoelectric Plethysmograph (PPG) waveforms and toe systolic pressure readings are included as required and additional duplex testing as needed. Limited examinations for reoccurring indications may be performed as noted.    ABI Findings: +---------+------------------+-----+----------+--------+ Right    Rt Pressure (mmHg)IndexWaveform  Comment  +---------+------------------+-----+----------+--------+ Brachial 121                                       +---------+------------------+-----+----------+--------+ ATA  119               0.94 monophasic         +---------+------------------+-----+----------+--------+ PTA      113               0.90 monophasic         +---------+------------------+-----+----------+--------+ Great  Toe64                0.51 Abnormal           +---------+------------------+-----+----------+--------+  +---------+------------------+-----+----------+-------+ Left     Lt Pressure (mmHg)IndexWaveform  Comment +---------+------------------+-----+----------+-------+ Brachial 126                                      +---------+------------------+-----+----------+-------+ ATA      113               0.90 monophasic        +---------+------------------+-----+----------+-------+ PTA      132               1.05 monophasic        +---------+------------------+-----+----------+-------+ Great Toe61                0.48 Dampened          +---------+------------------+-----+----------+-------+  +-------+-----------+-----------+------------+------------+ ABI/TBIToday's ABIToday's TBIPrevious ABIPrevious TBI +-------+-----------+-----------+------------+------------+ Right  .94        .51        .83         .37          +-------+-----------+-----------+------------+------------+ Left   1.05       .48        .81         .61          +-------+-----------+-----------+------------+------------+  Right ABIs appear essentially unchanged compared to prior study on 11/03/2020. Left ABIs appear decreased compared to prior study on 11/03/2020. Bilateral TBI's appear essentially unchanged as compared to the previous exam on 11/03/2020.   Summary: Right: Resting right ankle-brachial index indicates mild right lower extremity arterial disease. The right toe-brachial index is abnormal.  Left: Resting left ankle-brachial index is within normal range. No evidence of significant left lower extremity arterial disease. The left toe-brachial index is abnormal.  *See table(s) above for measurements and observations.    Electronically signed by Hortencia Pilar MD on 12/25/2020 at 5:10:36 PM.       Final    Assessment  The primary encounter diagnosis was Chronic pain  syndrome. Diagnoses of Pharmacologic therapy, Long term prescription opiate use, and Atherosclerosis of native arteries of the extremities with ulceration ([place]) were also pertinent to this visit.  Plan of Care  Problem-specific:  No problem-specific Assessment & Plan notes found for this encounter.  Kristin [name] has a current medication list which includes the following long-term medication(s): albuterol, apixaban, atorvastatin, bisacodyl, furosemide, gabapentin, levocetirizine, oxycodone hcl, polyethylene glycol, ropinirole, and benefiber.  Pharmacotherapy (Medications Ordered): Meds ordered this encounter  Medications  . Oxycodone HCl 10 MG TABS    Sig: Take 1 tablet (10 mg total) by mouth 2 (two) times daily as needed. Must last 30 days.    Dispense:  60 tablet    Refill:  0    Chronic Pain: STOP Act (Not applicable) Fill 1 day early if closed on refill date. Avoid benzodiazepines within 8 hours of opioids   Orders:  No orders of the defined  types were placed in this encounter.  Follow-up plan:   Return in about 30 days (around 01/28/2021) for evaluation day, (afternoon VV), (MM).      Interventional Therapies  Risk  Complexity Considerations:   NOTE: ELIQUIS ANTICOAGULATION (Stop: x3 days prior to procedure  Restart 6 hours after procedures)   Planned  Pending:   Pending further evaluation   Under consideration:   Diagnostic right SI joint block Possible bilateral SI joint RFA Diagnostic bilateral femoral nerve + obturator NB Possible bilateral femoral nerve + obturator nerve RFA Diagnostic right IA shoulder joint injection   Completed:   Diagnostic/therapeutic right L2 TFESI x1 (01/31/2020)  Diagnostic/therapeutic left L4 TFESI x1 (01/31/2020) Diagnostic left SI joint block x2 (09/27/2019) Palliative left CESI x2 (10/13/2017) Palliative left trochanteric bursa injection x1 (03/27/2019)  Palliative right IA hip inj. x1  (100% on the right) (02/08/2019) Palliative  left IA hip inj. x2 (02/08/2019) Palliative right lumbar facet MBB x2(01/10/2020) Palliative left lumbar facet MBB x4(01/10/2020) Palliative right lumbar facet RFA x1 (09/19/18) Palliative left lumbar facet RFA x3 (08/01/18) Diagnostic/therapeutic right suprascapular NB x3 (05/10/2017)  Palliative bilateral suprascapular nerve RFA x1(07/13/18)   Therapeutic  Palliative (PRN) options:   Diagnostic left SI joint block #2  Palliative left CESI #3  Palliative left trochanteric bursa injection #2  Palliative right IA hip inj. #2  (100% on the right) Palliative left IA hip inj. #3   Palliative right lumbar facet block #3 Palliative left lumbar facet block #5 Palliative right lumbar facet RFA #2  Palliative left lumbar facet RFA #4  Palliative bilateral suprascapular nerve block Palliative bilateral suprascapular nerve RFA #2      Recent Visits Date Type Provider Dept  11/24/20 Office Visit Milinda Pointer, MD [facility]  10/20/20 Office Visit Milinda Pointer, MD [facility]  10/02/20 Office Visit Milinda Pointer, MD [facility]  09/30/20 Procedure visit Milinda Pointer, MD [facility]  Showing recent visits within past 90 days and meeting all other requirements Today's Visits Date Type Provider Dept  12/29/20 Telemedicine Milinda Pointer, MD [facility]  Showing today's visits and meeting all other requirements Future Appointments Date Type Provider Dept  01/07/21 Appointment Milinda Pointer, MD [facility]  Showing future appointments within next 90 days and meeting all other requirements  I discussed the assessment and treatment plan with the patient. The patient was provided an opportunity to ask questions and all were answered. The patient agreed with the plan and demonstrated an understanding of the instructions.  Patient advised to call back or seek an in-person evaluation if the symptoms or  condition worsens.  Duration of encounter: 15 minutes.  Note by: Gaspar Cola, MD Date: 12/29/2020; Time: 3:59 PM

## 2020-12-29 ENCOUNTER — Encounter (INDEPENDENT_AMBULATORY_CARE_PROVIDER_SITE_OTHER): Payer: Self-pay | Admitting: Nurse Practitioner

## 2020-12-29 ENCOUNTER — Other Ambulatory Visit: Payer: Self-pay

## 2020-12-29 ENCOUNTER — Ambulatory Visit: Payer: Medicare HMO | Attending: Pain Medicine | Admitting: Pain Medicine

## 2020-12-29 DIAGNOSIS — Z79899 Other long term (current) drug therapy: Secondary | ICD-10-CM

## 2020-12-29 DIAGNOSIS — G894 Chronic pain syndrome: Secondary | ICD-10-CM | POA: Diagnosis not present

## 2020-12-29 DIAGNOSIS — I7025 Atherosclerosis of native arteries of other extremities with ulceration: Secondary | ICD-10-CM | POA: Diagnosis not present

## 2020-12-29 DIAGNOSIS — Z79891 Long term (current) use of opiate analgesic: Secondary | ICD-10-CM

## 2020-12-29 MED ORDER — OXYCODONE HCL 10 MG PO TABS: 10.0000 mg | ORAL_TABLET | Freq: Two times a day (BID) | ORAL | 0 refills | Status: DC | PRN

## 2020-12-29 NOTE — Progress Notes (Signed)
Subjective:    [name], female    DOB: [birth year], 87 y.o.   MRN: [id number] Chief Complaint  Patient presents with  . Follow-up    3 Mo RLE arterial . U/S    Today's is an 87 year old female that presents today due to discomfort in her left lower extremity.  The patient previously had an angiogram done on this left lower extremity due to a nonhealing ulceration.  The patient also notes that the ulceration has returned somewhat.  The area is largely scabbed over.  The patient also notes some burning and stinging in her lower extremities which is what bothers her the most.  However she denies any new wounds or ulcerations.  She denies any claudication-like symptoms.  She denies rest pain.  Today noninvasive studies show an ABI 0.94 the right and 1.05 on the left.  The patient has monophasic tibial waveforms bilaterally.  The patient also has good toe waveforms bilaterally.  The previous ABIs were 0.83 on the right and 0.81 on the left.   Review of Systems  Musculoskeletal: Positive for arthralgias.  Skin: Positive for wound.  Neurological: Positive for numbness.       Objective:   Physical Exam Vitals reviewed.  HENT:     Head: Normocephalic.  Cardiovascular:     Rate and Rhythm: Normal rate.     Pulses:          Dorsalis pedis pulses are 1+ on the right side and 1+ on the left side.       Posterior tibial pulses are detected w/ Doppler on the right side and detected w/ Doppler on the left side.  Pulmonary:     Effort: Pulmonary effort is normal.  Neurological:     Mental Status: She is alert and oriented to person, place, and time.  Psychiatric:        Mood and Affect: Mood normal.        Behavior: Behavior normal.        Thought Content: Thought content normal.        Judgment: Judgment normal.     BP (!) 148/70   Pulse 84   Ht 5\' 7"  (1.702 m)   Wt 146 lb (66.2 kg)   BMI 22.87 kg/m   Past Medical History:  Diagnosis Date  . Acute postoperative  pain 02/09/2018  . Arrhythmia, sinus node 04/03/2014  . Arthritis   . Arthritis   . Atrial fibrillation ([place])   . Back pain    lower back chronic  . Bradycardia   . Breast cancer ([place]) 2004   right breast cancer  . Cancer [facility]) 2004   rt breast cancer-post lumpectomy- chemo/rad  . CHF (congestive heart failure) ([place])   . Chronic back pain   . CKD (chronic kidney disease)   . COPD (chronic obstructive pulmonary disease) (H[facility])    wears O2 at 2L via [place] at night  . Cystocele   . Decubitus ulcers   . Dehydration   . Gout   . Hematuria   . Hepatitis C   . Hyperlipidemia   . Hypertension   . Hypomagnesemia 06/25/2015  . OAB (overactive bladder)   . Overactive bladder   . Restless leg   . Shoulder pain, bilateral   . Urinary frequency   . UTI (lower urinary tract infection)   . Vaginal atrophy     Social History   Socioeconomic History  . Marital status: Single    Spouse  name: Not on file  . Number of children: 2  . Years of education: 110  . Highest education level: 12th grade  Occupational History  . Occupation: retired  Tobacco Use  . Smoking status: Former Smoker    Packs/day: 1.25    Years: 40.00    Pack years: 50.00    Types: Cigarettes    Quit date: 12/16/1998    Years since quitting: 22.0  . Smokeless tobacco: Former Network engineer  . Vaping Use: Never used  Substance and Sexual Activity  . Alcohol use: No    Alcohol/week: 0.0 standard drinks  . Drug use: Yes    Types: Oxycodone  . Sexual activity: Not Currently    Birth control/protection: Surgical  Other Topics Concern  . Not on file  Social History Narrative  . Not on file   Social Determinants of Health   Financial Resource Strain: Low Risk   . Difficulty of Paying Living Expenses: Not hard at all  Food Insecurity: No Food Insecurity  . Worried About Charity fundraiser in the Last Year: Never true  . Ran Out of Food in the Last Year: Never true  Transportation Needs: No Transportation Needs   . Lack of Transportation (Medical): No  . Lack of Transportation (Non-Medical): No  Physical Activity: Inactive  . Days of Exercise per Week: 0 days  . Minutes of Exercise per Session: 0 min  Stress: No Stress Concern Present  . Feeling of Stress : Not at all  Social Connections: Not on file  Intimate Partner Violence: Not on file    Past Surgical History:  Procedure Laterality Date  . ABDOMINAL HYSTERECTOMY    . BACK SURGERY    . BREAST LUMPECTOMY Right 2004  . BREAST SURGERY     rt lumpectomy  . CARPAL TUNNEL RELEASE    . CATARACT EXTRACTION W/PHACO  10/19/2011   Procedure: CATARACT EXTRACTION PHACO AND INTRAOCULAR LENS PLACEMENT (IOC);  Surgeon: Elta Guadeloupe T. Gershon Crane, MD;  Location: [facility];  Service: Ophthalmology;  Laterality: Right;  CDE:10.81  . CATARACT EXTRACTION W/PHACO  11/02/2011   Procedure: CATARACT EXTRACTION PHACO AND INTRAOCULAR LENS PLACEMENT (IOC);  Surgeon: Elta Guadeloupe T. Gershon Crane, MD;  Location: [facility];  Service: Ophthalmology;  Laterality: Left;  CDE 8.60  . CHOLECYSTECTOMY     3/18  . COLON SURGERY    . INSERT / REPLACE / REMOVE PACEMAKER    . JOINT REPLACEMENT     bilateral TKA  . LOWER EXTREMITY ANGIOGRAPHY Left 10/14/2020   Procedure: LOWER EXTREMITY ANGIOGRAPHY;  Surgeon: Katha Cabal, MD;  Location: [facility];  Service: Cardiovascular;  Laterality: Left;  . PACEMAKER INSERTION N/A 12/24/2015   Procedure: INSERTION PACEMAKER;  Surgeon: Isaias Cowman, MD;  Location: [facility];  Service: Cardiovascular;  Laterality: N/A;  . ROTATOR CUFF REPAIR     bilateral    Family History  Problem Relation Age of Onset  . Kidney disease Brother   . Heart disease Father   . Heart disease Mother   . Stroke Mother   . Cancer Sister   . Breast cancer Sister 45  . Breast cancer Paternal Aunt   . Anesthesia problems Neg Hx   . Hypotension Neg Hx   . Malignant hyperthermia Neg Hx   . Pseudochol deficiency Neg Hx     Allergies  Allergen Reactions  .  Flexeril [Cyclobenzaprine Hcl]     Severe Rash  . Vancomycin Rash    [place] Syndrome  CBC Latest Ref Rng & Units 11/07/2020 08/11/2020 08/05/2020  WBC 4.0 - 10.5 K/uL 7.4 5.0 9.1  Hemoglobin 12.0 - 15.0 g/dL 12.3 11.9(L) 11.9(L)  Hematocrit 36.0 - 46.0 % 39.7 40.2 38.8  Platelets 150 - 400 K/uL 173 141(L) 141(L)      CMP     Component Value Date/Time   NA 134 (L) 11/07/2020 2002   NA 138 09/06/2014 2215   K 4.2 11/07/2020 2002   K 4.4 09/06/2014 2215   CL 108 11/07/2020 2002   CL 106 09/06/2014 2215   CO2 19 (L) 11/07/2020 2002   CO2 23 09/06/2014 2215   GLUCOSE 114 (H) 11/07/2020 2002   GLUCOSE 92 09/06/2014 2215   BUN 31 (H) 11/07/2020 2002   BUN 25 (H) 09/06/2014 2215   CREATININE 1.43 (H) 11/07/2020 2002   CREATININE 1.09 (H) 10/02/2020 1106   CALCIUM 9.0 11/07/2020 2002   CALCIUM 9.0 09/06/2014 2215   PROT 7.0 11/07/2020 2002   PROT 7.1 07/10/2014 1219   ALBUMIN 3.7 11/07/2020 2002   ALBUMIN 3.2 (L) 07/10/2014 1219   AST 21 11/07/2020 2002   AST 26 07/10/2014 1219   ALT 16 11/07/2020 2002   ALT 15 07/10/2014 1219   ALKPHOS 55 11/07/2020 2002   ALKPHOS 69 07/10/2014 1219   BILITOT 0.6 11/07/2020 2002   BILITOT 0.4 07/10/2014 1219   GFRNONAA 35 (L) 11/07/2020 2002   GFRNONAA 45 (L) 10/02/2020 1106   GFRAA 52 (L) 10/02/2020 1106     VAS [name] ABI WITH/WO TBI  Result Date: 12/25/2020  LOWER EXTREMITY DOPPLER STUDY Patient Name:  NNEOMA HARRAL  Date of Exam:   12/24/2020 Medical Rec #: [id number]      Accession #:    [id number] Date of Birth: May 14, 1930       Patient Gender: F Patient Age:   74Y Exam Location:  [facility] Procedure:      VAS [name] ABI WITH/WO TBI Referring Phys: [id number] Dolores Lory SCHNIER --------------------------------------------------------------------------------   Comparison Study: 11/03/2020 Performing Technologist: Charlane Ferretti RT (R)(VS)  Examination Guidelines: A complete evaluation includes at minimum, Doppler waveform signals  and systolic blood pressure reading at the level of bilateral brachial, anterior tibial, and posterior tibial arteries, when vessel segments are accessible. Bilateral testing is considered an integral part of a complete examination. Photoelectric Plethysmograph (PPG) waveforms and toe systolic pressure readings are included as required and additional duplex testing as needed. Limited examinations for reoccurring indications may be performed as noted.  ABI Findings: +---------+------------------+-----+----------+--------+ Right    Rt Pressure (mmHg)IndexWaveform  Comment  +---------+------------------+-----+----------+--------+ Brachial 121                                       +---------+------------------+-----+----------+--------+ ATA      119               0.94 monophasic         +---------+------------------+-----+----------+--------+ PTA      113               0.90 monophasic         +---------+------------------+-----+----------+--------+ Great Toe64                0.51 Abnormal           +---------+------------------+-----+----------+--------+ +---------+------------------+-----+----------+-------+ Left     Lt Pressure (mmHg)IndexWaveform  Comment +---------+------------------+-----+----------+-------+ Brachial 126                                      +---------+------------------+-----+----------+-------+  ATA      113               0.90 monophasic        +---------+------------------+-----+----------+-------+ PTA      132               1.05 monophasic        +---------+------------------+-----+----------+-------+ Great Toe61                0.48 Dampened          +---------+------------------+-----+----------+-------+ +-------+-----------+-----------+------------+------------+ ABI/TBIToday's ABIToday's TBIPrevious ABIPrevious TBI +-------+-----------+-----------+------------+------------+ Right  .94        .51        .83         .37           +-------+-----------+-----------+------------+------------+ Left   1.05       .48        .81         .61          +-------+-----------+-----------+------------+------------+ Right ABIs appear essentially unchanged compared to prior study on 11/03/2020. Left ABIs appear decreased compared to prior study on 11/03/2020. Bilateral TBI's appear essentially unchanged as compared to the previous exam on 11/03/2020.  Summary: Right: Resting right ankle-brachial index indicates mild right lower extremity arterial disease. The right toe-brachial index is abnormal. Left: Resting left ankle-brachial index is within normal range. No evidence of significant left lower extremity arterial disease. The left toe-brachial index is abnormal. *See table(s) above for measurements and observations.  Electronically signed by Hortencia Pilar MD on 12/25/2020 at 5:10:36 PM.    Final    ABI WITH/WO TBI  Result Date: 11/10/2020 LOWER EXTREMITY DOPPLER STUDY  Comparison Study: 10/14/2020 Performing Technologist: Charlane Ferretti RT (R)(VS)  Examination Guidelines: A complete evaluation includes at minimum, Doppler waveform signals and systolic blood pressure reading at the level of bilateral brachial, anterior tibial, and posterior tibial arteries, when vessel segments are accessible. Bilateral testing is considered an integral part of a complete examination. Photoelectric Plethysmograph (PPG) waveforms and toe systolic pressure readings are included as required and additional duplex testing as needed. Limited examinations for reoccurring indications may be performed as noted.  ABI Findings: +---------+------------------+-----+----------+--------+ Right    Rt Pressure (mmHg)IndexWaveform  Comment  +---------+------------------+-----+----------+--------+ Brachial 122                                       +---------+------------------+-----+----------+--------+ ATA      101               0.83 monophasic          +---------+------------------+-----+----------+--------+ PTA      99                0.81 monophasic         +---------+------------------+-----+----------+--------+ Great Toe45                0.37 Abnormal           +---------+------------------+-----+----------+--------+ +---------+------------------+-----+----------+-------+ Left     Lt Pressure (mmHg)IndexWaveform  Comment +---------+------------------+-----+----------+-------+ Brachial 112                                      +---------+------------------+-----+----------+-------+ ATA      99  0.81 monophasic        +---------+------------------+-----+----------+-------+ PTA      98                0.80 monophasic        +---------+------------------+-----+----------+-------+ Great Toe75                0.61 Abnormal          +---------+------------------+-----+----------+-------+ +-------+-----------+-----------+------------+------------+ ABI/TBIToday's ABIToday's TBIPrevious ABIPrevious TBI +-------+-----------+-----------+------------+------------+ Right  .83        .37        .82         .60          +-------+-----------+-----------+------------+------------+ Left   .81        .61        .96         .61          +-------+-----------+-----------+------------+------------+ Right ABIs appear essentially unchanged compared to prior study on 10/14/2020. Left ABIs appear decreased compared to prior study on 10/14/2020. Bilateral TBI's appear essentially unchanged as compared to the previous exam on 10/14/2020.  Summary: Right: Resting right ankle-brachial index indicates mild right lower extremity arterial disease. The right toe-brachial index is abnormal. Left: Resting left ankle-brachial index indicates mild left lower extremity arterial disease. The left toe-brachial index is abnormal. *See table(s) above for measurements and observations.  Electronically signed by Hortencia Pilar MD on  11/10/2020 at 5:16:23 PM.   Final        Assessment & Plan:   1. Atherosclerosis of native arteries of the extremities with ulceration ([place]) The patient's symptoms seems to have cleared first.  At the patient's last office visit it was nearly healed but it seems to have recurred.  Because of this we will have patient to visit the wound center for evaluation.  The patient is not keen on moving forward with intervention at this time so we will return in 6 weeks with an arterial duplex to see if we can evaluate for possible areas of significant stenosis.  Patient is advised to contact us if the swelling gets worse or she begins to have severe pain or discomfort in her lower extremities.  2. Hyperlipidemia, unspecified hyperlipidemia type Continue statin as ordered and reviewed, no changes at this time   3. Benign hypertension with CKD (chronic kidney disease) stage III (HCC) Continue antihypertensive medications as already ordered, these medications have been reviewed and there are no changes at this time.    Current Outpatient Medications on File Prior to Visit  Medication Sig Dispense Refill  . acetaminophen (TYLENOL) 500 MG tablet Take 500 mg by mouth in the morning and at bedtime.    Marland Kitchen apixaban (ELIQUIS) 5 MG TABS tablet Take 1 tablet (5 mg total) by mouth 2 (two) times daily. 60 tablet 0  . aspirin EC 81 MG tablet Take 1 tablet (81 mg total) by mouth daily. Swallow whole. 150 tablet 2  . atorvastatin (LIPITOR) 20 MG tablet Take 1 tablet (20 mg total) by mouth daily. 30 tablet 3  . bisacodyl (DULCOLAX) 5 MG EC tablet Take 2 tablets (10 mg total) by mouth daily as needed for moderate constipation. 180 tablet 1  . Cholecalciferol (VITAMIN D) 2000 units tablet Take 2,000 Units by mouth daily.     Marland Kitchen dextromethorphan-guaiFENesin (MUCINEX DM) 30-600 MG 12hr tablet Take 1 tablet by mouth 2 (two) times daily.    . diclofenac Sodium (VOLTAREN) 1 % GEL Apply 1 application topically 4 (four) times  daily as needed (pain).    . furosemide (LASIX) 40 MG tablet TAKE (1) TABLET BY MOUTH ONCE DAILY AS NEEDED. (Patient taking differently: Take 20 mg by mouth daily.) 30 tablet 11  . gabapentin (NEURONTIN) 100 MG capsule Take 1 capsule (100 mg total) by mouth 3 (three) times daily. 90 capsule 5  . levocetirizine (XYZAL) 5 MG tablet Take 0.5 tablets (2.5 mg total) by mouth every other day. 23 tablet 4  . lisinopril (ZESTRIL) 10 MG tablet Take by mouth.    . Melatonin 10 MG TABS Take 10 mg by mouth at bedtime.    . mupirocin ointment (BACTROBAN) 2 % Apply 1 application topically 2 (two) times daily as needed. For up to 7-10 days as needed to help heal and prevent infection. 22 g 2  . oxyCODONE (OXY IR/ROXICODONE) 5 MG immediate release tablet Take 1 tablet (5 mg total) by mouth 2 (two) times daily as needed for severe pain. Must last 30 days 60 tablet 0  . OXYGEN Inhale 2 L into the lungs at bedtime.     . polyethylene glycol (MIRALAX / GLYCOLAX) 17 g packet Take 17 g by mouth daily as needed for moderate constipation. 90 packet 1  . rOPINIRole (REQUIP) 2 MG tablet Take 2 tablets (4 mg total) by mouth at bedtime. 180 tablet 1  . solifenacin (VESICARE) 5 MG tablet Take 1 tablet by mouth daily.    . Tiotropium Bromide-Olodaterol (STIOLTO RESPIMAT) 2.5-2.5 MCG/ACT AERS Inhale into the lungs.    . umeclidinium-vilanterol (ANORO ELLIPTA) 62.5-25 MCG/INH AEPB Inhale into the lungs.    Marland Kitchen albuterol (PROVENTIL) (2.5 MG/3ML) 0.083% nebulizer solution Take 3 mLs (2.5 mg total) by nebulization every 8 (eight) hours as needed for wheezing or shortness of breath. 75 mL 3  . Wheat Dextrin (BENEFIBER) POWD Take 6 g by mouth 3 (three) times daily before meals. (2 tsp = 6 g) (Patient taking differently: Take 6 g by mouth 3 (three) times daily as needed (constipation). (2 tsp = 6 g)) 730 g 8   No current facility-administered medications on file prior to visit.    There are no Patient Instructions on file for this  visit. No follow-ups on file.   Kris Hartmann, NP

## 2021-01-01 ENCOUNTER — Other Ambulatory Visit: Payer: Self-pay

## 2021-01-01 NOTE — Patient Outreach (Signed)
[facility]) Care Management  01/01/2021  Kristin [name] [birth year] [id number]   Telephone Screen  Referral Date: 12/31/2020 Referral Source: Tier 4 Report Referral Reason:"high risk patient" Insurance: [facility] Medicare Initial Assessment  Outreach attempt # 1 to patient. Spoke with patient discussed and reviewed [facility] services. Verbal consent for services given.  Patient resides in her home along with her adult son. She takes care of her 29yr old great-grandchild during the day. She is independent with ADLs and most IADLs except she no longer drives. Her family takes her to MD appts. No recent falls. She uses cane to assit with ambulation.    Conditions: Per chart review, patient has PMH that includes but not limited to chronic pain, arthritis, breast CA(2004), CHF, HTN, HLD, CKD, COPD and RLS.   Medications Reviewed Today    Reviewed by Hayden Pedro, RN (Registered Nurse) on 01/01/21 at 1114  Med List Status: <None>  Medication Order Taking? Sig Documenting Provider Last Dose Status Informant  acetaminophen (TYLENOL) 500 MG tablet [id number] No Take 500 mg by mouth in the morning and at bedtime. [provider] Taking Active Self  albuterol (PROVENTIL) (2.5 MG/3ML) 0.083% nebulizer solution [id number] No Take 3 mLs (2.5 mg total) by nebulization every 8 (eight) hours as needed for wheezing or shortness of breath. Olin Hauser, DO 10/13/2020 Unknown time Expired 10/01/20 2359 Self  apixaban (ELIQUIS) 5 MG TABS tablet [id number] No Take 1 tablet (5 mg total) by mouth 2 (two) times daily. Loletha Grayer, MD Taking Active Self           Med Note Vinie Sill Oct 07, 2020 11:26 AM)    aspirin EC 81 MG tablet [id number] No Take 1 tablet (81 mg total) by mouth daily. Swallow whole. Schnier, Dolores Lory, MD Taking Active   atorvastatin (LIPITOR) 20 MG tablet [id number] No Take 1 tablet (20 mg total) by mouth daily. Schnier, Dolores Lory, MD  Taking Active   bisacodyl (DULCOLAX) 5 MG EC tablet [id number] No Take 2 tablets (10 mg total) by mouth daily as needed for moderate constipation. Olin Hauser, DO Taking Active Self  Cholecalciferol (VITAMIN D) 2000 units tablet [id number] No Take 2,000 Units by mouth daily.  [provider] Taking Active Self  dextromethorphan-guaiFENesin (MUCINEX DM) 30-600 MG 12hr tablet [id number] No Take 1 tablet by mouth 2 (two) times daily. [provider] Taking Active Self  diclofenac Sodium (VOLTAREN) 1 % GEL [id number] No Apply 1 application topically 4 (four) times daily as needed (pain). [provider] Taking Active Self  furosemide (LASIX) 40 MG tablet [id number] No TAKE (1) TABLET BY MOUTH ONCE DAILY AS NEEDED.  Patient taking differently: Take 20 mg by mouth daily.   Kathrine Haddock, NP Taking Active   gabapentin (NEURONTIN) 100 MG capsule [id number] No Take 1 capsule (100 mg total) by mouth 3 (three) times daily. Olin Hauser, DO Taking Active   levocetirizine (XYZAL) 5 MG tablet [id number] No Take 0.5 tablets (2.5 mg total) by mouth every other day. Olin Hauser, DO Taking Active Self  lidocaine (XYLOCAINE) 5 % ointment [id number] No Apply 1 application topically daily as needed. Kris Hartmann, NP Taking Active   lisinopril (ZESTRIL) 10 MG tablet [id number] No Take by mouth. [provider] Taking Active   Melatonin 10 MG TABS [id number] No Take 10 mg by mouth at bedtime. [provider] Taking Active Self  mupirocin ointment (BACTROBAN) 2 % [id number] No Apply  1 application topically 2 (two) times daily as needed. For up to 7-10 days as needed to help heal and prevent infection. Olin Hauser, DO Taking Active Self  Oxycodone HCl 10 MG TABS [id number] No Take 1 tablet (10 mg total) by mouth 2 (two) times daily as needed. Must last 30 days. Milinda Pointer, MD Taking Active            Med Note Dossie Arbour, Jerelyn Charles Dec 29, 2020  3:54 PM) FUTURE Prescription. (NOT a DUPLICATE!!) >>>DO NOT DELETE<<< (even if Expired!) See Care Coordination Note from [facility] (Dr. Dossie Arbour)   OXYGEN [id number] No Inhale 2 L into the lungs at bedtime.  [provider] Taking Active Self           Med Note (HILL, TIFFANY A   Tue Apr 25, 2018  3:19 PM) At night as needed  polyethylene glycol (MIRALAX / GLYCOLAX) 17 g packet [id number] No Take 17 g by mouth daily as needed for moderate constipation. Olin Hauser, DO Taking Active   rOPINIRole (REQUIP) 2 MG tablet [id number] No Take 2 tablets (4 mg total) by mouth at bedtime. Olin Hauser, DO Taking Active Self  solifenacin (VESICARE) 5 MG tablet [id number] No Take 1 tablet by mouth daily. [provider] Taking Active   Tiotropium Bromide-Olodaterol (STIOLTO RESPIMAT) 2.5-2.5 MCG/ACT AERS [id number] No Inhale into the lungs. [provider] Taking Active   umeclidinium-vilanterol (ANORO ELLIPTA) 62.5-25 MCG/INH AEPB [id number] No Inhale into the lungs. [provider] Taking Active   Wheat Dextrin (BENEFIBER) POWD [id number] No Take 6 g by mouth 3 (three) times daily before meals. (2 tsp = 6 g)  Patient taking differently: Take 6 g by mouth 3 (three) times daily as needed (constipation). (2 tsp = 6 g)   Milinda Pointer, MD Taking Expired 07/31/20 2359 Self           Med Note (GARNER, Marsh Dolly Dec 19, 2019  8:35 AM) PRN  Med List Note Janett Billow, [place] 11/24/20 1315): MR 12/24/20 UDS 11/24/20         Fall Risk  01/01/2021 11/24/2020 10/20/2020 10/02/2020 09/30/2020  Falls in the past year? 0 0 0 0 0  Number falls in past yr: 0 - - - 0  Injury with Fall? 0 - - - 0  Risk for fall due to : Impaired balance/gait;Medication side effect - - - -  Follow up Education provided;Falls evaluation completed - - - -   Depression screen [facility] 2/9 01/01/2021 09/30/2020 09/17/2020 05/13/2020 04/01/2020  Decreased  Interest 0 0 0 0 0  Down, Depressed, Hopeless 0 0 0 0 0  PHQ - 2 Score 0 0 0 0 0  Altered sleeping - - - - -  Tired, decreased energy - - - - -  Change in appetite - - - - -  Feeling bad or failure about yourself  - - - - -  Trouble concentrating - - - - -  Moving slowly or fidgety/restless - - - - -  Suicidal thoughts - - - - -  PHQ-9 Score - - - - -  Difficult doing work/chores - - - - -  Some recent data might be hidden   SDOH Screenings   Alcohol Screen: Not on file  Depression (PHQ2-9): Low Risk   . PHQ-2 Score: 0  Financial Resource Strain: Low Risk   . Difficulty of Paying Living Expenses: Not hard  at all  Food Insecurity: No Food Insecurity  . Worried About Charity fundraiser in the Last Year: Never true  . Ran Out of Food in the Last Year: Never true  Housing: Not on file  Physical Activity: Inactive  . Days of Exercise per Week: 0 days  . Minutes of Exercise per Session: 0 min  Social Connections: Not on file  Stress: No Stress Concern Present  . Feeling of Stress : Not at all  Tobacco Use: Medium Risk  . Smoking Tobacco Use: Former Smoker  . Smokeless Tobacco Use: Former Soil scientist Needs: No Transportation Needs  . Lack of Transportation (Medical): No  . Lack of Transportation (Non-Medical): No   Goals Addressed              This Visit's Progress   .  (THN)Cope with Chronic Pain (pt-stated)        Timeframe:  Long-Range Goal Priority:  High Start Date:  01/01/2021                           Expected End Date: 04/21/2021                      Follow Up Date August 2022    Barriers: Health Behaviors Knowledge  - learn relaxation techniques - use relaxation during pain  Complete MD appts as ordered -adhere to pain regimen   Why is this important?    Stress makes chronic pain feel worse.   Feelings like depression, anxiety, stress and anger can make your body more sensitive to pain.   Learning ways to cope with stress or depression may  help you find some relief from the pain.     Notes:  01/01/21-Patient reports chronic leg pain. She is followed by pain mgmt clinic. She saw vein specialist on alst week as well. She voices that she has been referred to another office for further "leg testing/evaluation" to determine possible causes and tx options.     .  (THN)Track and Manage Symptoms-Heart Failure (pt-stated)        Timeframe:  Long-Range Goal Priority:  High Start Date:  01/01/21                           Expected End Date:  04/21/2021                     Follow Up Date August 2022  Barriers: Health Behaviors Knowledge    - develop a rescue plan - follow rescue plan if symptoms flare-up - know when to call the doctor - track symptoms and what helps feel better or worse    Why is this important?    You will be able to handle your symptoms better if you keep track of them.   Making some simple changes to your lifestyle will help.   Eating healthy is one thing you can do to take good care of yourself.    Notes:  01/01/21-Patient reports intermittent issues with BLE edema/swelling. She is adhering to low salt diet and diuretic regimen. She has scale in the home and weighs about 2-3x/week. Patient uses compression stockings as well as keeps feet/legs elevated.        Plan: RN CM discussed with patient next outreach within the month of August. Patient gave verbal consent and in agreement with RN CM follow up  and timeframe. Patient aware that they may contact RN CM sooner for any issues or concerns. RN CM reviewed goals and plan of care with patient. Patient agrees to care plan and follow up.  RN CM will send barriers letter and route encounter to PCP. RN CM will send welcome letter to patient.  Enzo Montgomery, RN,BSN,CCM [place] Management Telephonic Care Management Coordinator Direct Phone: [phone] Toll Free: [phone] Fax: [phone]

## 2021-01-06 ENCOUNTER — Other Ambulatory Visit: Payer: Self-pay | Admitting: Physician Assistant

## 2021-01-06 ENCOUNTER — Ambulatory Visit
Admission: RE | Admit: 2021-01-06 | Discharge: 2021-01-06 | Disposition: A | Discharge status: Home / Self Care | Payer: Medicare HMO | Attending: Physician Assistant | Admitting: Physician Assistant

## 2021-01-06 ENCOUNTER — Ambulatory Visit
Admission: RE | Admit: 2021-01-06 | Discharge: 2021-01-06 | Disposition: A | Discharge status: Home / Self Care | Payer: Medicare HMO | Source: Ambulatory Visit | Attending: Internal Medicine | Admitting: Internal Medicine

## 2021-01-06 ENCOUNTER — Other Ambulatory Visit: Payer: Self-pay

## 2021-01-06 ENCOUNTER — Other Ambulatory Visit: Payer: Self-pay | Admitting: Internal Medicine

## 2021-01-06 ENCOUNTER — Encounter: Payer: Medicare HMO | Attending: Physician Assistant | Admitting: Physician Assistant

## 2021-01-06 DIAGNOSIS — Z7901 Long term (current) use of anticoagulants: Secondary | ICD-10-CM | POA: Insufficient documentation

## 2021-01-06 DIAGNOSIS — S81802A Unspecified open wound, left lower leg, initial encounter: Secondary | ICD-10-CM | POA: Diagnosis not present

## 2021-01-06 DIAGNOSIS — S91302A Unspecified open wound, left foot, initial encounter: Secondary | ICD-10-CM | POA: Diagnosis not present

## 2021-01-06 DIAGNOSIS — I480 Paroxysmal atrial fibrillation: Secondary | ICD-10-CM | POA: Insufficient documentation

## 2021-01-06 DIAGNOSIS — G9009 Other idiopathic peripheral autonomic neuropathy: Secondary | ICD-10-CM | POA: Insufficient documentation

## 2021-01-06 DIAGNOSIS — L97522 Non-pressure chronic ulcer of other part of left foot with fat layer exposed: Secondary | ICD-10-CM | POA: Diagnosis not present

## 2021-01-06 DIAGNOSIS — L309 Dermatitis, unspecified: Secondary | ICD-10-CM | POA: Diagnosis not present

## 2021-01-06 DIAGNOSIS — M19072 Primary osteoarthritis, left ankle and foot: Secondary | ICD-10-CM | POA: Diagnosis not present

## 2021-01-06 DIAGNOSIS — L97322 Non-pressure chronic ulcer of left ankle with fat layer exposed: Secondary | ICD-10-CM | POA: Diagnosis not present

## 2021-01-06 DIAGNOSIS — N189 Chronic kidney disease, unspecified: Secondary | ICD-10-CM | POA: Insufficient documentation

## 2021-01-06 DIAGNOSIS — L97329 Non-pressure chronic ulcer of left ankle with unspecified severity: Secondary | ICD-10-CM | POA: Diagnosis present

## 2021-01-06 DIAGNOSIS — M7989 Other specified soft tissue disorders: Secondary | ICD-10-CM | POA: Diagnosis not present

## 2021-01-06 NOTE — Progress Notes (Signed)
Lafevers, [name] ([id number]) Visit Report for 01/06/2021 Chief Complaint Document Details Patient Name: Kristin [name], [name] Date of Service: 01/06/2021 8:30 AM Medical Record Number: [id number] Patient Account Number: [id number] Date of Birth/Sex: 1930-08-12 (87 y.o. F) Treating RN: Dolan Amen Primary Care Provider: [name] Other Clinician: Referring Provider: Eulogio Ditch Treating Provider/Extender: [name] in Treatment: 0 Information Obtained from: Patient Chief Complaint Left ankle and left foot ulcers Electronic Signature(s) Signed: 01/06/2021 9:30:30 AM By: Worthy Keeler PA-C Entered By: Worthy Keeler on 01/06/2021 09:30:29 Kristin [name], [name] ([id number]) -------------------------------------------------------------------------------- Debridement Details Patient Name: Spangler, [name] Date of Service: 01/06/2021 8:30 AM Medical Record Number: [id number] Patient Account Number: [id number] Date of Birth/Sex: May 02, 1930 (87 y.o. F) Treating RN: Dolan Amen Primary Care Provider: [name] Other Clinician: Referring Provider: Eulogio Ditch Treating Provider/Extender: [name] in Treatment: 0 Debridement Performed for Wound #1 Left Malleolus Assessment: Performed By: Physician Tommie Sams., PA-C Debridement Type: Debridement Level of Consciousness (Pre- Awake and Alert procedure): Pre-procedure Verification/Time Out Yes - 09:34 Taken: Start Time: 09:34 Pain Control: Lidocaine 4% Topical Solution Total Area Debrided (L x W): 0.6 (cm) x 1 (cm) = 0.6 (cm) Tissue and other material Non-Viable, Eschar, Subcutaneous, Skin: Dermis , Skin: Epidermis, Fibrin/Exudate debrided: Level: Skin/Subcutaneous Tissue Debridement Description: Excisional Instrument: Curette, Other : Punch biopsy Specimen: Tissue Culture Number of Specimens Taken: 1 Bleeding: Minimum Hemostasis Achieved: Silver Nitrate Response to Treatment: Procedure was  tolerated well Level of Consciousness (Post- Awake and Alert procedure): Post Debridement Measurements of Total Wound Length: (cm) 0.6 Width: (cm) 1 Depth: (cm) 0.2 Volume: (cm) 0.094 Character of Wound/Ulcer Post Debridement: Stable Post Procedure Diagnosis Same as Pre-procedure Notes 3 mL lidocaine used Electronic Signature(s) Signed: 01/06/2021 11:05:30 AM By: Worthy Keeler PA-C Signed: 01/06/2021 3:02:05 PM By: Georges Mouse, Minus Breeding RN Entered By: Worthy Keeler on 01/06/2021 11:05:29 Vancleve, [name] ([id number]) -------------------------------------------------------------------------------- HPI Details Patient Name: Kanner, [name] Date of Service: 01/06/2021 8:30 AM Medical Record Number: [id number] Patient Account Number: [id number] Date of Birth/Sex: 1929/10/13 (87 y.o. F) Treating RN: Dolan Amen Primary Care Provider: [name] Other Clinician: Referring Provider: Eulogio Ditch Treating Provider/Extender: [name] in Treatment: 0 History of Present Illness HPI Description: Left 0.81 Right 0.83 12/25/20 About a year but patient is not sure how long for sure 01/06/2021 upon evaluation today patient presents for initial inspection here in our clinic concerning issues she has been having in 2 areas one being on her left lateral malleolus. This is the most significant of the 2 regions based on what I am seeing today. The other is actually over her left dorsal 3rd toe. With that being said the patient tells me that currently she has been tolerating the dressing changes without complication. There does not appear to be any signs of obvious infection at this time but nonetheless she does not seem to be doing a lot better. She is mainly just been putting a cream on the areas but she is not sure what cream this was when I questioned her about that today she states "it may be on my med list" although I did not see that. The patient is really not a very good  historian in general concerning how long this has been going on and what exactly is going on. The patient does have a history of neuropathy, chronic kidney disease, atrial fibrillation and is on long-term anticoagulant therapy as well as aspirin. This includes Eliquis. Electronic Signature(s) Signed: 01/06/2021  10:47:16 AM By: Worthy Keeler PA-C Entered By: Worthy Keeler on 01/06/2021 10:47:16 Kristin [name], [name] ([id number]) -------------------------------------------------------------------------------- Physical Exam Details Patient Name: Kristin [name], [name] Date of Service: 01/06/2021 8:30 AM Medical Record Number: [id number] Patient Account Number: [id number] Date of Birth/Sex: October 07, 1929 (87 y.o. F) Treating RN: Dolan Amen Primary Care Provider: [name] Other Clinician: Referring Provider: Eulogio Ditch Treating Provider/Extender: [name] in Treatment: 0 Constitutional patient is hypertensive.. pulse regular and within target range for patient.Marland Kitchen respirations regular, non-labored and within target range for patient.Marland Kitchen temperature within target range for patient.. Well-nourished and well-hydrated in no acute distress. Eyes conjunctiva clear no eyelid edema noted. pupils equal round and reactive to light and accommodation. Ears, Nose, Mouth, and Throat no gross abnormality of ear auricles or external auditory canals. normal hearing noted during conversation. mucus membranes moist. Respiratory normal breathing without difficulty. clear to auscultation bilaterally. Cardiovascular 1+ dorsalis pedis/posterior tibialis pulses. no clubbing, cyanosis, significant edema, <3 sec cap refill. Musculoskeletal normal gait and posture. no significant deformity or arthritic changes, no loss or range of motion, no clubbing. Psychiatric this patient is able to make decisions and demonstrates good insight into disease process. Alert and Oriented x 3. pleasant and  cooperative. Notes Upon inspection patient's wound bed actually showed signs of some irregular appearing tissue along the lateral ankle region. For this reason I discussed with her that I felt like a biopsy would be appropriate to try to determine what exactly is going on here. She tolerated that I did clear away some of the thickened tissue over top of the wound in order to get to the wound base for appropriate biopsy today. This was done carefully just in case this is a skin cancer did not want to do anything to exacerbate the situation. Subsequently I then performed a punch biopsy superficially considering the ankle area there really is not a lot of tissue to obtain a sample from here. That superficial sample then was removed and placed in the formalin for transport and evaluation. We will see what the results of that show. With that being said the patient seems to be doing fine post procedure she was having no pain I did use lidocaine to numb the area. Electronic Signature(s) Signed: 01/06/2021 11:00:53 AM By: Worthy Keeler PA-C Entered By: Worthy Keeler on 01/06/2021 11:00:53 Kristin [name], [name] ([id number]) -------------------------------------------------------------------------------- Physician Orders Details Patient Name: Kristin [name], [name] Date of Service: 01/06/2021 8:30 AM Medical Record Number: [id number] Patient Account Number: [id number] Date of Birth/Sex: November 03, 1929 (87 y.o. 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 Bathing/ Shower/ Hygiene o May shower; gently cleanse wound with antibacterial soap, rinse and pat dry prior to dressing wounds Wound Treatment Wound #1 - Malleolus Wound Laterality: Left Cleanser: Byram Ancillary Kit - 15 Day Supply (DME) (Generic) 3 x Per Week/30 Days Discharge Instructions: Use supplies as instructed; Kit contains: (15) Saline Bullets; (15) 3x3 Gauze; 15 pr Gloves Cleanser: Normal Saline 3 x Per Week/30 Days Discharge Instructions: Wash your  hands with soap and water. Remove old dressing, discard into plastic bag and place into trash. Cleanse the wound with Normal Saline prior to applying a clean dressing using gauze sponges, not tissues or cotton balls. Do not scrub or use excessive force. Pat dry using gauze sponges, not tissue or cotton balls. Primary Dressing: Hydrofera Blue Ready Transfer Foam, 2.5x2.5 (in/in) (DME) (Generic) 3 x Per Week/30 Days Discharge Instructions: Apply Hydrofera Blue Ready to wound bed as directed Secondary Dressing: [place] Dressing, 4x4 (in/in) (DME) (Generic) 3 x Per Week/30 Days Discharge Instructions: Apply over dressing to secure in place. Wound #2 - Metatarsal head third Wound Laterality: Left, Lateral Cleanser: Byram Ancillary Kit - 15 Day Supply (Generic) 3 x Per Week/30 Days Discharge Instructions: Use supplies as instructed; Kit contains: (15) Saline Bullets; (15) 3x3 Gauze; 15 pr Gloves Cleanser: Normal Saline 3 x Per Week/30 Days Discharge Instructions: Wash your hands with soap and water. Remove old dressing, discard into plastic bag and place into trash. Cleanse the wound with Normal Saline prior to applying a clean dressing using gauze sponges, not tissues or cotton balls. Do not scrub or use excessive force. Pat dry using gauze sponges, not tissue or cotton  balls. Primary Dressing: Hydrofera Blue Ready Transfer Foam, 2.5x2.5 (in/in) 3 x Per Week/30 Days Discharge Instructions: Apply Hydrofera Blue Ready to wound bed as directed Secondary Dressing: Coverlet Latex-Free Fabric Adhesive Dressings 3 x Per Week/30 Days Discharge Instructions: 1.5 x 2 Laboratory o Bacteria identified in Tissue by Biopsy culture (MICRO) oooo LOINC Code: 15400-8 Kristin [name], [name] ([id number]) oooo Convenience Name: Biopsy specimen culture Radiology o X-ray, foot o X-ray, ankle Electronic Signature(s) Signed: 01/06/2021 3:02:05 PM By: Georges Mouse, Minus Breeding RN Signed: 01/06/2021 3:13:06 PM By: Worthy Keeler PA-C Entered By: Georges Mouse, Minus Breeding on 01/06/2021 09:54:28 Kristin [name], [name] ([id number]) -------------------------------------------------------------------------------- Problem List Details Patient Name: Chronis, Nura L. Date of Service: 01/06/2021 8:30 AM Medical Record Number: [id number] Patient Account Number: [id number] Date of Birth/Sex: 1929/10/07 (87 y.o. F) Treating RN: Dolan Amen Primary Care Provider: [name] Other Clinician: Referring Provider: Eulogio Ditch Treating Provider/Extender: Jeri Cos Weeks in Treatment: 0 Active Problems ICD-10 Encounter Code Description Active Date MDM Diagnosis G90.09 Other idiopathic peripheral autonomic neuropathy 01/06/2021 No Yes L97.322 Non-pressure chronic ulcer of left ankle with fat layer exposed 01/06/2021 No Yes L97.522 Non-pressure chronic ulcer of other part of left foot with fat layer 01/06/2021 No Yes exposed N18.9 Chronic kidney disease, unspecified 01/06/2021 No Yes I48.0 Paroxysmal atrial fibrillation 01/06/2021 No Yes Z79.01 Long term (current) use of anticoagulants 01/06/2021 No Yes Inactive Problems Resolved Problems Electronic Signature(s) Signed: 01/06/2021 9:30:16 AM By: Worthy Keeler PA-C Entered By: Worthy Keeler on 01/06/2021 09:30:16 Kristin [name], [name]  ([id number]) -------------------------------------------------------------------------------- Progress Note Details Patient Name: Kristin [name], [name] Date of Service: 01/06/2021 8:30 AM Medical Record Number: [id number] Patient Account Number: [id number] Date of Birth/Sex: [birth year] (87 y.o. F) Treating RN: Dolan Amen Primary Care Provider: [name] Other Clinician: Referring Provider: Eulogio Ditch Treating Provider/Extender: [name] in Treatment: 0 Subjective Chief Complaint Information obtained from Patient Left ankle and left foot ulcers History of Present Illness (HPI) Left 0.81 Right 0.83 12/25/20 About a year but patient is not sure how long for sure 01/06/2021 upon evaluation today patient presents for initial inspection here in our clinic concerning issues she has been having in 2 areas one being on her left lateral malleolus. This is the most significant of the 2 regions based on  what I am seeing today. The other is actually over her left dorsal 3rd toe. With that being said the patient tells me that currently she has been tolerating the dressing changes without complication. There does not appear to be any signs of obvious infection at this time but nonetheless she does not seem to be doing a lot better. She is mainly just been putting a cream on the areas but she is not sure what cream this was when I questioned her about that today she states "it may be on my med list" although I did not see that. The patient is really not a very good historian in general concerning how long this has been going on and what exactly is going on. The patient does have a history of neuropathy, chronic kidney disease, atrial fibrillation and is on long-term anticoagulant therapy as well as aspirin. This includes Eliquis. Patient History Unable to Obtain Patient History due to Altered Mental Status. Allergies vancomycin (Severity: Moderate, Reaction: rash), Flexeril (Severity:  Moderate, Reaction: rash) Social History Former smoker, Marital Status - Single, Alcohol Use - Never, Drug Use - No History, Caffeine Use - Daily. Medical History Eyes Patient has history of Cataracts - hx surgery Respiratory Patient has history of Chronic Obstructive Pulmonary Disease (COPD) Cardiovascular Patient has history of Arrhythmia - Afib, Congestive Heart Failure, Coronary Artery Disease, Hypertension Gastrointestinal Patient has history of Hepatitis C - noted in hx Endocrine Denies history of Type I Diabetes, Type II Diabetes Integumentary (Skin) Patient has history of History of pressure wounds - hx noted left ankle Musculoskeletal Patient has history of Gout, Osteoarthritis Oncologic Patient has history of Received Chemotherapy, Received Radiation Medical And Surgical History Notes Oncologic R lumpectomy Breast cancer hx Review of Systems (Kristin [name]) Constitutional Symptoms (General Health) Denies complaints or symptoms of Fatigue, Fever, Chills, Marked Weight Change. Eyes Complains or has symptoms of Glasses / Contacts. Ear/Nose/Mouth/Throat Denies complaints or symptoms of Difficult clearing ears, Sinusitis. Hematologic/Lymphatic Denies complaints or symptoms of Bleeding / Clotting Disorders, Human Immunodeficiency Virus. Respiratory Complains or has symptoms of Shortness of Breath. Kristin [name], [name] ([id number]) Cardiovascular states has pacemaker Endocrine Complains or has symptoms of Hepatitis - c. Genitourinary Denies complaints or symptoms of Kidney failure/ Dialysis, overactive bladder CKD in hx hx hematuria UTI hx Immunological Denies complaints or symptoms of Hives, Itching. Integumentary (Skin) Denies complaints or symptoms of Wounds. Neurologic Denies complaints or symptoms of Numbness/parasthesias, Focal/Weakness. Psychiatric Denies complaints or symptoms of Anxiety, Claustrophobia. Objective Constitutional patient is hypertensive.. pulse regular  and within target range for patient.Marland Kitchen respirations regular, non-labored and within target range for patient.Marland Kitchen temperature within target range for patient.. Well-nourished and well-hydrated in no acute distress. Vitals Time Taken: 8:46 AM, Height: 67 in, Source: Stated, Weight: 147 lbs, Source: Measured, BMI: 23, Temperature: 97.8 F, Pulse: 69 bpm, Respiratory Rate: 18 breaths/min, Blood Pressure: 159/95 mmHg. Eyes conjunctiva clear no eyelid edema noted. pupils equal round and reactive to light and accommodation. Ears, Nose, Mouth, and Throat no gross abnormality of ear auricles or external auditory canals. normal hearing noted during conversation. mucus membranes moist. Respiratory normal breathing without difficulty. clear to auscultation bilaterally. Cardiovascular 1+ dorsalis pedis/posterior tibialis pulses. no clubbing, cyanosis, significant edema, Musculoskeletal normal gait and posture. no significant deformity or arthritic changes, no loss or range of motion, no clubbing. Psychiatric this patient is able to make decisions and demonstrates good insight into disease process. Alert and Oriented x 3. pleasant and cooperative. General Notes: Upon inspection patient's wound bed actually  showed signs of some irregular appearing tissue along the lateral ankle region. For this reason I discussed with her that I felt like a biopsy would be appropriate to try to determine what exactly is going on here. She tolerated that I did clear away some of the thickened tissue over top of the wound in order to get to the wound base for appropriate biopsy today. This was done carefully just in case this is a skin cancer did not want to do anything to exacerbate the situation. Subsequently I then performed a punch biopsy superficially considering the ankle area there really is not a lot of tissue to obtain a sample from here. That superficial sample then was removed and placed in the formalin for transport  and evaluation. We will see what the results of that show. With that being said the patient seems to be doing fine post procedure she was having no pain I did use lidocaine to numb the area. Integumentary (Hair, Skin) Wound #1 status is Open. Original cause of wound was Gradually Appeared. The date acquired was: 09/23/2020. The wound is located on the Left Malleolus. The wound measures 0.6cm length x 1cm width x 0.1cm depth; 0.471cm^2 area and 0.047cm^3 volume. There is Fat Layer (Subcutaneous Tissue) exposed. There is no tunneling or undermining noted. There is a medium amount of sanguinous drainage noted. There is no granulation within the wound bed. There is a large (67-100%) amount of necrotic tissue within the wound bed including Eschar. Wound #2 status is Open. Original cause of wound was Gradually Appeared. The date acquired was: 04/23/2020. The wound is located on the Left,Dorsal Toe Third. The wound measures 0.2cm length x 0.5cm width x 0.1cm depth; 0.079cm^2 area and 0.008cm^3 volume. There is Fat Layer (Subcutaneous Tissue) exposed. There is no tunneling or undermining noted. There is a medium amount of serosanguineous drainage noted. There is small (1-33%) red, pink granulation within the wound bed. There is a large (67-100%) amount of necrotic tissue within the wound bed including Eschar. Assessment Active Problems Finnigan, Jazminn L. 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 There was a Excisional Skin/Subcutaneous Tissue Debridement with a total area of 0.6 sq cm performed by Tommie Sams.,  PA-C. With the following instrument(s): Curette, Punch biopsy to remove Non-Viable tissue/material. Material removed includes Eschar, Subcutaneous Tissue, Skin: Dermis, Skin: Epidermis, and Fibrin/Exudate after achieving pain control using Lidocaine 4% Topical Solution. 1 specimen was taken by a Tissue Culture and sent to the lab per facility protocol. A time out was conducted at 09:34, prior to the start of the procedure. A Minimum amount of bleeding was controlled with Silver Nitrate. The procedure was tolerated well. Post Debridement Measurements: 0.6cm length x 1cm width x 0.2cm depth; 0.094cm^3 volume. Character of Wound/Ulcer Post Debridement is stable. Post procedure Diagnosis Wound #1: Same as Pre-Procedure General Notes: 3 mL lidocaine used. Plan Follow-up Appointments: Return Appointment in 1 week. Bathing/ Shower/ Hygiene: May shower; gently cleanse wound with antibacterial soap, rinse and pat dry prior to dressing wounds Radiology ordered were: X-ray, foot, X-ray, ankle Laboratory ordered were: Biopsy specimen culture WOUND #1: - Malleolus Wound Laterality: Left Cleanser: Byram Ancillary Kit -  15 Day Supply (DME) (Generic) 3 x Per Week/30 Days Discharge Instructions: Use supplies as instructed; Kit contains: (15) Saline Bullets; (15) 3x3 Gauze; 15 pr Gloves Cleanser: Normal Saline 3 x Per Week/30 Days Discharge Instructions: Wash your hands with soap and water. Remove old dressing, discard into plastic bag and place into trash. Cleanse the wound with Normal Saline prior to applying a clean dressing using gauze sponges, not tissues or cotton balls. Do not scrub or use excessive force. Pat dry using gauze sponges, not tissue or cotton balls. Primary Dressing: Hydrofera Blue Ready Transfer Foam, 2.5x2.5 (in/in) (DME) (Generic) 3 x Per Week/30 Days Discharge Instructions: Apply Hydrofera Blue Ready to wound bed as directed Secondary Dressing: [place] Dressing, 4x4  (in/in) (DME) (Generic) 3 x Per Week/30 Days Discharge Instructions: Apply over dressing to secure in place. WOUND #2: - Metatarsal head third Wound Laterality: Left, Lateral Cleanser: Byram Ancillary Kit - 15 Day Supply (Generic) 3 x Per Week/30 Days Discharge Instructions: Use supplies as instructed; Kit contains: (15) Saline Bullets; (15) 3x3 Gauze; 15 pr Gloves Cleanser: Normal Saline 3 x Per Week/30 Days Discharge Instructions: Wash your hands with soap and water. Remove old dressing, discard into plastic bag and place into trash. Cleanse the wound with Normal Saline prior to applying a clean dressing using gauze sponges, not tissues or cotton balls. Do not scrub or use excessive force. Pat dry using gauze sponges, not tissue or cotton balls. Primary Dressing: Hydrofera Blue Ready Transfer Foam, 2.5x2.5 (in/in) 3 x Per Week/30 Days Discharge Instructions: Apply Hydrofera Blue Ready to wound bed as directed Secondary Dressing: Coverlet Latex-Free Fabric Adhesive Dressings 3 x Per Week/30 Days Discharge Instructions: 1.5 x 2 1. Would recommend currently that we actually initiate treatment with a Hydrofera Blue dressing for both wound locations. I think this is a good option based on what I am seeing initially at least here. 2. I am also can recommend that we have the patient go for an x-ray of the foot and ankle just to evaluate and see if there is any abnormalities here. 3. I am also can recommend a biopsy obviously which was taken today postdebridement and subsequently depending on the results of the biopsy we will make any adjustments in therapy as needed. This may include referral depending on what is found if anything. 4. I am also can recommend that the patient cover with a Band-Aid over the toe area following the Hydrofera Blue and on the ankle region a large [employer] dressing. Kristin [name], Roselle L. ([id number]) We will see patient back for reevaluation in 1 week here in the  clinic. If anything worsens or changes patient will contact our office for additional recommendations. Electronic Signature(s) Signed: 01/06/2021 11:05:41 AM By: Worthy Keeler PA-C Previous Signature: 01/06/2021 11:04:00 AM Version By: Worthy Keeler PA-C Entered By: Worthy Keeler on 01/06/2021 11:05:41 Kristin [name], [name] ([id number]) -------------------------------------------------------------------------------- Kristin [name]/[facility] Details Patient Name: Mayr, Piedad L. Date of Service: 01/06/2021 8:30 AM Medical Record Number: [id number] Patient Account Number: [id number] Date of Birth/Sex: March 30, 1930 (87 y.o. F) Treating RN: Donnamarie Poag Primary Care Provider: [name] Other Clinician: Referring Provider: Eulogio Ditch Treating Provider/Extender: [name] in Treatment: 0 Unable to Obtain Patient History due to oo Altered Mental Status Constitutional Symptoms (General Health) Complaints and Symptoms: Negative for: Fatigue; Fever; Chills; Marked Weight Change Eyes Complaints and Symptoms: Positive for: Glasses / Contacts Medical History: Positive for: Cataracts - hx surgery Ear/Nose/Mouth/Throat Complaints and Symptoms: Negative for: Difficult  clearing ears; Sinusitis Hematologic/Lymphatic Complaints and Symptoms: Negative for: Bleeding / Clotting Disorders; Human Immunodeficiency Virus Respiratory Complaints and Symptoms: Positive for: Shortness of Breath Medical History: Positive for: Chronic Obstructive Pulmonary Disease (COPD) Endocrine Complaints and Symptoms: Positive for: Hepatitis - c Medical History: Negative for: Type I Diabetes; Type II Diabetes Genitourinary Complaints and Symptoms: Negative for: Kidney failure/ Dialysis Review of System Notes: overactive bladder CKD in hx hx hematuria UTI hx Immunological Complaints and Symptoms: Negative for: Hives; Itching Integumentary (Skin) Lincoln, Darnice L. ([id number]) Complaints and Symptoms: Negative  for: Wounds Medical History: Positive for: History of pressure wounds - hx noted left ankle Neurologic Complaints and Symptoms: Negative for: Numbness/parasthesias; Focal/Weakness Psychiatric Complaints and Symptoms: Negative for: Anxiety; Claustrophobia Cardiovascular Complaints and Symptoms: Review of System Notes: states has pacemaker Medical History: Positive for: Arrhythmia - Afib; Congestive Heart Failure; Coronary Artery Disease; Hypertension Gastrointestinal Medical History: Positive for: Hepatitis C - noted in hx Musculoskeletal Medical History: Positive for: Gout; Osteoarthritis Oncologic Medical History: Positive for: Received Chemotherapy; Received Radiation Past Medical History Notes: R lumpectomy Breast cancer hx HBO Extended History Items Eyes: Cataracts Immunizations Pneumococcal Vaccine: Received Pneumococcal Vaccination: No Implantable Devices Yes Family and Social History Former smoker; Marital Status - Single; Alcohol Use: Never; Drug Use: No History; Caffeine Use: Daily; Financial Concerns: No; Food, Clothing or Shelter Needs: No; Support System Lacking: No; Transportation Concerns: No Electronic Signature(s) Signed: 01/06/2021 2:35:55 PM By: Donnamarie Poag Signed: 01/06/2021 3:13:06 PM By: Worthy Keeler PA-C Entered By: Donnamarie Poag on 01/06/2021 09:04:17 Garde, Mavi L. ([id number]) -------------------------------------------------------------------------------- SuperBill Details Patient Name: Bouie, Melany L. Date of Service: 01/06/2021 Medical Record Number: [id number] Patient Account Number: [id number] Date of Birth/Sex: 08-Aug-1930 (87 y.o. 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 N18.9 Chronic kidney disease, unspecified I48.0 Paroxysmal atrial fibrillation Z79.01 Long term (current) use of anticoagulants Facility Procedures CPT4 Code: 63845364 Description: 99213 - WOUND CARE VISIT-LEV 3 EST PT Modifier: Quantity: 1 CPT4 Code: 68032122 Description: 11042 - DEB SUBQ TISSUE 20 SQ CM/< Modifier: Quantity: 1 CPT4 Code: Description: ICD-10 Diagnosis Description L97.322 Non-pressure chronic ulcer of left ankle with fat layer exposed Modifier: Quantity: Physician Procedures CPT4 Code: 4825003 Description: 70488 - WC PHYS LEVEL 4 - NEW PT Modifier: 25 Quantity: 1 CPT4 Code: Description: ICD-10 Diagnosis Description G90.09 Other idiopathic peripheral autonomic 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

## 2021-01-06 NOTE — Progress Notes (Signed)
Kristin [name], [name] ([id number]) Visit Report for 01/06/2021 Allergy List Details Patient Name: Kristin [name], [name] Date of Service: 01/06/2021 8:30 AM Medical Record Number: [id number] Patient Account Number: [id number] Date of Birth/Sex: July 19, 1930 (87 y.o. F) Treating [name]: Kristin [name] Primary Care Kristin [name]: Kristin [name] Other Clinician: Referring Kristin [name]: Kristin [name] Treating Telicia Hodgkiss/Extender: Jeri Cos Weeks in Treatment: 0 Allergies Active Allergies vancomycin Reaction: rash Severity: Moderate Flexeril Reaction: rash Severity: Moderate Allergy Notes Electronic Signature(s) Signed: 01/06/2021 2:35:55 PM By: Kristin [name] Entered ByDonnamarie [name] on 01/06/2021 08:51:35 [name], [name] ([id number]) -------------------------------------------------------------------------------- Arrival Information Details Patient Name: Kristin [name], [name] Date of Service: 01/06/2021 8:30 AM Medical Record Number: [id number] Patient Account Number: [id number] Date of Birth/Sex: [birth year] (87 y.o. F) Treating [name]: Kristin [name] Primary Care Kristin [name]: Kristin [name] Other Clinician: Referring Kristin [name]: Kristin [name] Treating Kristin [name]/Extender: Kristin [name] in Treatment: 0 Visit Information Patient Arrived: Kristin [name] Time: 08:46 Accompanied By: self Transfer Assistance: None Patient Identification Verified: Yes Secondary Verification Process Completed: Yes Patient Has Alerts: Yes Patient Alerts: Patient on Blood Thinner ELIQUIS and ASPIRIN NOT diabetic ABI left 0.81 _0  ABI right 0.83 _1  Electronic Signature(s) Signed: 01/06/2021 2:35:55 PM By: Kristin [name] Entered ByDonnamarie [name] on 01/06/2021 09:08:27 [name], [name] ([id number]) -------------------------------------------------------------------------------- Clinic Level of Care Assessment Details Patient Name: Kristin [name], [name] Date of Service: 01/06/2021 8:30 AM Medical Record Number: [id number] Patient Account  Number: [id number] Date of Birth/Sex: 1930-01-19 (87 y.o. F) Treating [name]: Kristin [name] Primary Care Kristin [name]: Kristin [name] Other Clinician: Referring Kristin [name]: Kristin [name] Treating Makye Radle/Extender: Kristin [name] in Treatment: 0 Clinic Level of Care Assessment Items TOOL 1 Quantity Score X - Use when EandM and Procedure is performed on INITIAL visit 1 0 ASSESSMENTS - Nursing Assessment / Reassessment X - General Physical Exam (combine w/ comprehensive assessment (listed just below) when performed on new 1 20 pt. evals) X- 1 25 Comprehensive Assessment (HX, ROS, Risk Assessments, Wounds Hx, etc.) ASSESSMENTS - Wound and Skin Assessment / Reassessment X - Dermatologic / Skin Assessment (not related to wound area) 1 10 ASSESSMENTS - Ostomy and/or Continence Assessment and Care _2  - Incontinence Assessment and Management 0 _3  - 0 Ostomy Care Assessment and Management (repouching, etc.) PROCESS - Coordination of Care X - Simple Patient / Family Education for ongoing care 1 15 _4  - 0 Complex (extensive) Patient / Family Education for ongoing care X- 1 10 Staff obtains Programmer, systems, Records, Test Results / Process Orders _5  - 0 Staff telephones HHA, Nursing Homes / Clarify orders / etc _6  - 0 Routine Transfer to another Facility (non-emergent condition) _7  - 0 Routine Hospital Admission (non-emergent condition) _8  - 0 New Admissions / Biomedical engineer / Ordering NPWT, Apligraf, etc. _9  - 0 Emergency Hospital Admission (emergent condition) PROCESS - Special Needs _10  - Pediatric / Minor Patient Management 0 _11  - 0 Isolation Patient Management _12  - 0 Hearing / Language / Visual special needs _13  - 0 Assessment of Community assistance (transportation, D/C planning, etc.) _14  - 0 Additional assistance / Altered mentation _15  - 0 Support Surface(s) Assessment (bed, cushion, seat, etc.) INTERVENTIONS - Miscellaneous _16  - External ear exam 0 _17  - 0 Patient  Transfer (multiple staff / Civil Service fast streamer / Similar devices) _18  - 0 Simple Staple / Suture removal (25 or less) _19  - 0 Complex Staple / Suture removal (26 or more) _20  - 0 Hypo/Hyperglycemic Management (do not check if billed separately) _21  - 0 Ankle / Brachial Index (ABI) - do not check if billed separately  Has the patient been seen at the hospital within the last three years: Yes Total Score: 80 Level Of Care: New/Established - Level 3 Kristin [name], [name] ([id number]) Electronic Signature(s) Signed: 01/06/2021 3:02:05 PM By: Kristin [name] Entered By: Kristin Mouse, Minus Breeding on 01/06/2021 09:55:40 Kristin [name], [name] ([id number]) -------------------------------------------------------------------------------- Encounter Discharge Information Details Patient Name: [name], [name] Date of Service: 01/06/2021 8:30 AM Medical Record Number: [id number] Patient Account Number: [id number] Date of Birth/Sex: March 02, 1930 (87 y.o. F) Treating [name]: Kristin [name] Primary Care Auther Lyerly: Kristin [name] Other Clinician: Referring Kiondre Grenz: Kristin [name] Treating Briscoe Daniello/Extender: Kristin [name] in Treatment: 0 Encounter Discharge Information Items Post Procedure Vitals Discharge Condition: Stable Temperature (F): 97.8 Ambulatory Status: Cane Pulse (bpm): 69 Discharge Destination: Home Respiratory Rate (breaths/min): 18 Transportation: Private Auto Blood Pressure (mmHg): 159/95 Accompanied By: self Schedule Follow-up Appointment: Yes Clinical Summary of Care: Electronic Signature(s) Signed: 01/06/2021 2:43:37 PM By: Jeanine Luz Entered By: Jeanine Luz on 01/06/2021 10:09:07 Kristin [name], [name] ([id number]) -------------------------------------------------------------------------------- Lower Extremity Assessment Details Patient Name: Kristin [name], [name] Date of Service: 01/06/2021 8:30 AM Medical Record Number: [id number] Patient Account Number: [id number] Date of Birth/Sex: [birth year]  (87 y.o. F) Treating [name]: Kristin [name] Primary Care Drinda Belgard: Kristin [name] Other Clinician: Referring Zalman Hull: Kristin [name] Treating Celestia Duva/Extender: Kristin [name] in Treatment: 0 Edema Assessment Assessed: [Left: Yes] [Right: No] [Left: Edema] [Right: :] Calf Left: Right: Point of Measurement: 33 cm From Medial Instep 32.3 cm Ankle Left: Right: Point of Measurement: 9 cm From Medial Instep 22.2 cm Knee To Floor Left: Right: From Medial Instep 47 cm Vascular Assessment Pulses: Dorsalis Pedis Doppler Audible: [Left:Yes] Posterior Tibial Doppler Audible: [Left:Yes] Electronic Signature(s) Signed: 01/06/2021 2:35:55 PM By: Kristin [name] Entered ByDonnamarie [name] on 01/06/2021 09:09:49 Pinkett, Jalayia L. ([id number]) -------------------------------------------------------------------------------- Multi Wound Chart Details Patient Name: Kristin [name], [name] Date of Service: 01/06/2021 8:30 AM Medical Record Number: [id number] Patient Account Number: [id number] Date of Birth/Sex: 1930/03/26 (87 y.o. F) Treating [name]: Kristin [name] Primary Care Emagene Merfeld: Kristin [name] Other Clinician: Referring Makayle Krahn: Kristin [name] Treating Steffie Waggoner/Extender: Kristin [name] in Treatment: 0 Vital Signs Height(in): 67 Pulse(bpm): 72 Weight(lbs): 147 Blood Pressure(mmHg): 159/95 Body Mass Index(BMI): 23 Temperature(F): 97.8 Respiratory Rate(breaths/min): 18 Photos: [N/A:N/A] Wound Location: Left Malleolus Left, Lateral Metatarsal head third N/A Wounding Event: Gradually Appeared Gradually Appeared N/A Primary Etiology: To be determined To be determined N/A Comorbid History: Cataracts, Chronic Obstructive Cataracts, Chronic Obstructive N/A Pulmonary Disease (COPD), Pulmonary Disease (COPD), Arrhythmia, Congestive Heart Arrhythmia, Congestive Heart Failure, Coronary Artery Disease, Failure, Coronary Artery Disease, Hypertension, Hepatitis C, History Hypertension, Hepatitis C,  History of pressure wounds, Gout, of pressure wounds, Gout, Osteoarthritis, Received Osteoarthritis, Received Chemotherapy, Received Radiation Chemotherapy, Received Radiation Date Acquired: 09/23/2020 04/23/2020 N/A Weeks of Treatment: 0 0 N/A Wound Status: Open Open N/A Measurements L x W x D (cm) 0.6x1x0.1 0.2x0.5x0.1 N/A Area (cm) : 0.471 0.079 N/A Volume (cm) : 0.047 0.008 N/A Classification: Full Thickness Without Exposed Full Thickness Without Exposed N/A Support Structures Support Structures Exudate Amount: None Present Medium N/A Exudate Type: N/A Serosanguineous N/A Exudate Color: N/A red, brown N/A Granulation Amount: None Present (0%) Small (1-33%) N/A Granulation Quality: N/A Red, Pink N/A Necrotic Amount: Large (67-100%) Large (67-100%) N/A Necrotic Tissue: Eschar Eschar N/A Exposed Structures: Fat Layer (Subcutaneous Tissue): Fat Layer (Subcutaneous Tissue): N/A Yes Yes Fascia: No Fascia: No Tendon: No Tendon: No Muscle: No Muscle: No Joint: No Joint: No Bone: No Bone: No Epithelialization: None N/A N/A Treatment Notes Electronic Signature(s) Signed: 01/06/2021  3:02:05 PM By: Kristin [name] Entered By: Kristin Mouse, Minus Breeding on 01/06/2021 09:33:51 Kristin [name], [name] ([id number]) Kristin [name], [name] ([id number]) -------------------------------------------------------------------------------- Kristin [name], [name] Date of Service: 01/06/2021 8:30 AM Medical Record Number: [id number] Patient Account Number: [id number] Date of Birth/Sex: Jan 12, 1930 (87 y.o. F) Treating [name]: Kristin [name] Primary Care Jaylia Pettus: Kristin [name] Other Clinician: Referring Loveta Dellis: Kristin [name] Treating Mylo Choi/Extender: Kristin [name] in Treatment: 0 Active Inactive Necrotic Tissue Nursing Diagnoses: Impaired tissue integrity related to necrotic/devitalized tissue Goals: Necrotic/devitalized tissue will be minimized  in the wound bed Date Initiated: 01/06/2021 Target Resolution Date: 01/06/2021 Goal Status: Active Patient/caregiver will verbalize understanding of reason and process for debridement of necrotic tissue Date Initiated: 01/06/2021 Target Resolution Date: 01/06/2021 Goal Status: Active Interventions: Assess patient pain level pre-, during and post procedure and prior to discharge Provide education on necrotic tissue and debridement process Treatment Activities: Apply topical anesthetic as ordered : 01/06/2021 Enzymatic debridement : 01/06/2021 Excisional debridement : 01/06/2021 Notes: Orientation to the Wound Care Program Nursing Diagnoses: Knowledge deficit related to the wound healing center program Goals: Patient/caregiver will verbalize understanding of the [facility] Date Initiated: 01/06/2021 Target Resolution Date: 01/06/2021 Goal Status: Active Interventions: Provide education on orientation to the wound center Notes: Wound/Skin Impairment Nursing Diagnoses: Impaired tissue integrity Goals: Patient/caregiver will verbalize understanding of skin care regimen Date Initiated: 01/06/2021 Target Resolution Date: 01/06/2021 Goal Status: Active Ulcer/skin breakdown will have a volume reduction of 30% by week 4 Date Initiated: 01/06/2021 Target Resolution Date: 02/06/2021 Goal Status: Active Ulcer/skin breakdown will have a volume reduction of 50% by week 8 Date Initiated: 01/06/2021 Target Resolution Date: 03/08/2021 Goal Status: Active Eyster, Anda L. ([id number]) Ulcer/skin breakdown will have a volume reduction of 80% by week 12 Date Initiated: 01/06/2021 Target Resolution Date: 04/08/2021 Goal Status: Active Ulcer/skin breakdown will heal within 14 weeks Date Initiated: 01/06/2021 Target Resolution Date: 05/09/2021 Goal Status: Active Interventions: Assess patient/caregiver ability to obtain necessary supplies Assess patient/caregiver ability to perform  ulcer/skin care regimen upon admission and as needed Assess ulceration(s) every visit Provide education on ulcer and skin care Treatment Activities: Referred to DME Romano Stigger for dressing supplies : 01/06/2021 Skin care regimen initiated : 01/06/2021 Notes: Electronic Signature(s) Signed: 01/06/2021 3:02:05 PM By: Kristin [name] Entered By: Kristin Mouse, Minus Breeding on 01/06/2021 09:33:42 Kristin [name], Mirha L. ([id number]) -------------------------------------------------------------------------------- Pain Assessment Details Patient Name: Eads, Gaila L. Date of Service: 01/06/2021 8:30 AM Medical Record Number: [id number] Patient Account Number: [id number] Date of Birth/Sex: 01-02-1930 (87 y.o. F) Treating [name]: Kristin [name] Primary Care Lilly Gasser: Kristin [name] Other Clinician: Referring Noelle Sease: Kristin [name] Treating Emilyrose Darrah/Extender: Kristin [name] in Treatment: 0 Active Problems Location of Pain Severity and Description of Pain Patient Has Paino Yes Site Locations Pain Location: Pain in Ulcers Rate the pain. Current Pain Level: 4 Pain Management and Medication Current Pain Management: Electronic Signature(s) Signed: 01/06/2021 2:35:55 PM By: Kristin [name] Entered By: Kristin [name] on 01/06/2021 08:47:52 Robenson, Muslima L. ([id number]) -------------------------------------------------------------------------------- Patient/Caregiver Education Details Patient Name: Verdejo, Kela L. Date of Service: 01/06/2021 8:30 AM Medical Record Number: [id number] Patient Account Number: [id number] Date of Birth/Gender: March 25, 1930 (87 y.o. F) Treating [name]: Kristin [name] Primary Care Physician: Kristin [name] Other Clinician: Referring Physician: Eulogio [name] Treating Physician/Extender: Kristin [name] in Treatment: 0 Education Assessment Education Provided To: Patient Education Topics Provided Wound Debridement: Methods: Explain/Verbal Responses: State content  correctly Wound/Skin Impairment: Methods: Explain/Verbal Responses: State content correctly Electronic Signature(s) Signed: 01/06/2021  3:02:05 PM By: Kristin [name] Entered By: Kristin Mouse, Minus Breeding on 01/06/2021 09:56:01 Purifoy, Elva L. ([id number]) -------------------------------------------------------------------------------- Wound Assessment Details Patient Name: Bixler, Toccara L. Date of Service: 01/06/2021 8:30 AM Medical Record Number: [id number] Patient Account Number: [id number] Date of Birth/Sex: 1930/08/13 (87 y.o. F) Treating [name]: Kristin [name] Primary Care Alilah Mcmeans: Kristin [name] Other Clinician: Referring Jakyrie Totherow: Kristin [name] Treating Quandarius Nill/Extender: Kristin [name] in Treatment: 0 Wound Status Wound Number: 1 Primary Neuropathic Ulcer-Non Diabetic Etiology: Wound Location: Left Malleolus Wound Open Wounding Event: Gradually Appeared Status: Date Acquired: 09/23/2020 Comorbid Cataracts, Chronic Obstructive Pulmonary Disease (COPD), Weeks Of Treatment: 0 History: Arrhythmia, Congestive Heart Failure, Coronary Artery Clustered Wound: No Disease, Hypertension, Hepatitis C, History of pressure wounds, Gout, Osteoarthritis, Received Chemotherapy, Received Radiation Photos Wound Measurements Length: (cm) 0.6 Width: (cm) 1 Depth: (cm) 0.1 Area: (cm) 0.471 Volume: (cm) 0.047 % Reduction in Area: 0% % Reduction in Volume: 0% Epithelialization: None Tunneling: No Undermining: No Wound Description Classification: Full Thickness Without Exposed Support Structu Exudate Amount: Medium Exudate Type: Sanguinous Exudate Color: red res Foul Odor After Cleansing: No Slough/Fibrino Yes Wound Bed Granulation Amount: None Present (0%) Exposed Structure Necrotic Amount: Large (67-100%) Fascia Exposed: No Necrotic Quality: Eschar Fat Layer (Subcutaneous Tissue) Exposed: Yes Tendon Exposed: No Muscle Exposed: No Joint Exposed: No Bone Exposed:  No Treatment Notes Wound #1 (Malleolus) Wound Laterality: Left Cleanser [place] Ancillary Kit - 15 Day Supply Discharge Instruction: Use supplies as instructed; Kit contains: (15) Saline Bullets; (15) 3x3 Gauze; 15 pr Gloves Weichert, Devanny L. ([id number]) Normal Saline Discharge Instruction: Wash your hands with soap and water. Remove old dressing, discard into plastic bag and place into trash. Cleanse the wound with Normal Saline prior to applying a clean dressing using gauze sponges, not tissues or cotton balls. Do not scrub or use excessive force. Pat dry using gauze sponges, not tissue or cotton balls. Peri-Wound Care Topical Primary Dressing Hydrofera Blue Ready Transfer Foam, 2.5x2.5 (in/in) Discharge Instruction: Apply Hydrofera Blue Ready to wound bed as directed Secondary Dressing [place] Dressing, 4x4 (in/in) Discharge Instruction: Apply over dressing to secure in place. Secured With Compression Wrap Compression Stockings Add-Ons Electronic Signature(s) Signed: 01/06/2021 2:35:55 PM By: Kristin [name] Signed: 01/06/2021 3:02:05 PM By: Kristin [name] Entered By: Kristin Mouse, Minus Breeding on 01/06/2021 09:52:36 Blumenstein, Delphia L. ([id number]) -------------------------------------------------------------------------------- Wound Assessment Details Patient Name: Ben, Tannah L. Date of Service: 01/06/2021 8:30 AM Medical Record Number: [id number] Patient Account Number: [id number] Date of Birth/Sex: [birth year] (87 y.o. F) Treating [name]: Kristin [name] Primary Care Jalayiah Bibian: Kristin [name] Other Clinician: Referring Bexleigh Theriault: Kristin [name] Treating Brynden Thune/Extender: Kristin [name] in Treatment: 0 Wound Status Wound Number: 2 Primary To be determined Etiology: Wound Location: Left, Lateral Metatarsal head third Wound Open Wounding Event: Gradually Appeared Status: Date Acquired: 04/23/2020 Comorbid Cataracts, Chronic Obstructive Pulmonary Disease  (COPD), Weeks Of Treatment: 0 History: Arrhythmia, Congestive Heart Failure, Coronary Artery Clustered Wound: No Disease, Hypertension, Hepatitis C, History of pressure wounds, Gout, Osteoarthritis, Received Chemotherapy, Received Radiation Photos Wound Measurements Length: (cm) 0.2 Width: (cm) 0.5 Depth: (cm) 0.1 Area: (cm) 0.079 Volume: (cm) 0.008 % Reduction in Area: % Reduction in Volume: Tunneling: No Undermining: No Wound Description Classification: Full Thickness Without Exposed Support Structu Exudate Amount: Medium Exudate Type: Serosanguineous Exudate Color: red, brown res Foul Odor After Cleansing: No Slough/Fibrino Yes Wound Bed Granulation Amount: Small (1-33%) Exposed Structure Granulation Quality: Red, Pink Fascia Exposed: No Necrotic Amount: Large (67-100%) Fat Layer (Subcutaneous Tissue) Exposed: Yes  Necrotic Quality: Eschar Tendon Exposed: No Muscle Exposed: No Joint Exposed: No Bone Exposed: No Electronic Signature(s) Signed: 01/06/2021 2:35:55 PM By: Kristin [name] Entered By: Kristin [name] on 01/06/2021 09:17:49 Augustine, Jurline L. ([id number]) -------------------------------------------------------------------------------- Vitals Details Patient Name: Michels, Lawanna L. Date of Service: 01/06/2021 8:30 AM Medical Record Number: [id number] Patient Account Number: [id number] Date of Birth/Sex: 1930-05-13 (87 y.o. F) Treating [name]: Kristin [name] Primary Care Rilya Longo: Kristin [name] Other Clinician: Referring Angelicia Lessner: Kristin [name] Treating Margarit Minshall/Extender: Kristin [name] in Treatment: 0 Vital Signs Time Taken: 08:46 Temperature (F): 97.8 Height (in): 67 Pulse (bpm): 69 Source: Stated Respiratory Rate (breaths/min): 18 Weight (lbs): 147 Blood Pressure (mmHg): 159/95 Source: Measured Reference Range: 80 - 120 mg / dl Body Mass Index (BMI): 23 Electronic Signature(s) Signed: 01/06/2021 2:35:55 PM By: Kristin [name] Entered ByDonnamarie [name] on  01/06/2021 08:50:45

## 2021-01-06 NOTE — Progress Notes (Signed)
Kristin [name], [name] ([id number]) Visit Report for 01/06/2021 Abuse/Suicide Risk Screen Details Patient Name: Kristin [name], [name] Date of Service: 01/06/2021 8:30 AM Medical Record Number: [id number] Patient Account Number: [id number] Date of Birth/Sex: Sep 19, 1929 (87 y.o. F) Treating RN: Donnamarie Poag Primary Care Kiyah Demartini: [name] Other Clinician: Referring Jayleena Stille: Eulogio Ditch Treating Daegon Deiss/Extender: [name] in Treatment: 0 Abuse/Suicide Risk Screen Items Answer ABUSE RISK SCREEN: Has anyone close to you tried to hurt or harm you recentlyo No Do you feel uncomfortable with anyone in your familyo No Has anyone forced you do things that you didnot want to doo No Electronic Signature(s) Signed: 01/06/2021 2:35:55 PM By: Donnamarie Poag Entered ByDonnamarie Poag on 01/06/2021 08:53:29 Kristin [name], [name] ([id number]) -------------------------------------------------------------------------------- Activities of Daily Living Details Patient Name: Lengel, Svara L. Date of Service: 01/06/2021 8:30 AM Medical Record Number: [id number] Patient Account Number: [id number] Date of Birth/Sex: 1929-09-27 (87 y.o. F) Treating RN: Donnamarie Poag Primary Care Demarquez Ciolek: [name] Other Clinician: Referring Dekota Shenk: Eulogio Ditch Treating Roxas Clymer/Extender: [name] in Treatment: 0 Activities of Daily Living Items Answer Activities of Daily Living (Please select one for each item) Drive Automobile Completely Able Take Medications Completely Able Use Telephone Completely Able Care for Appearance Completely Able Use Toilet Completely Able Bath / Shower Completely Able Dress Self Completely Able Feed Self Completely Able Walk Completely Able Get In / Out Bed Completely Able Housework Completely Able Prepare Meals Completely Able Handle Money Completely Able Shop for Self Completely Able Electronic Signature(s) Signed: 01/06/2021 2:35:55 PM By: Donnamarie Poag Entered ByDonnamarie Poag on 01/06/2021 08:52:14 Kristin [name], [name] ([id number]) -------------------------------------------------------------------------------- Education Screening Details Patient Name: Kristin [name], [name] Date of Service: 01/06/2021 8:30 AM Medical Record Number: [id number] Patient Account Number: [id number] Date of Birth/Sex: Jan 20, 1930 (87 y.o. F) Treating RN: Donnamarie Poag Primary Care Samir Ishaq: [name] Other Clinician: Referring Nyaja [place]: Eulogio Ditch Treating Jakelyn Squyres/Extender: [name] in Treatment: 0 Primary Learner Assessed: Patient Learning Preferences/Education Level/Primary Language Learning Preference: Explanation Highest Education Level: College or Above Preferred Language: English Cognitive Barrier Language Barrier: No Translator Needed: No Memory Deficit: No Emotional Barrier: No Cultural/Religious Beliefs Affecting Medical Care: No Physical Barrier Impaired Vision: No Impaired Hearing: No Decreased Hand dexterity: No Knowledge/Comprehension Knowledge Level: High Comprehension Level: High Ability to understand written instructions: High Ability to understand verbal instructions: High Motivation Anxiety Level: Calm Cooperation: Cooperative Education Importance: Acknowledges Need Interest in Health Problems: Asks Questions Perception: Coherent Willingness to Engage in Self-Management High Activities: Readiness to Engage in Self-Management High Activities: Electronic Signature(s) Signed: 01/06/2021 2:35:55 PM By: Donnamarie Poag Entered ByDonnamarie Poag on 01/06/2021 08:52:44 Kristin [name], [name] ([id number]) -------------------------------------------------------------------------------- Fall Risk Assessment Details Patient Name: Kristin [name], [name] Date of Service: 01/06/2021 8:30 AM Medical Record Number: [id number] Patient Account Number: [id number] Date of Birth/Sex: August 04, 1930 (87 y.o. F) Treating RN: Donnamarie Poag Primary Care Magdiel Bartles:  [name] Other Clinician: Referring Krue Peterka: Eulogio Ditch Treating Jaspreet Hollings/Extender: [name] in Treatment: 0 Fall Risk Assessment Items Have you had 2 or more falls in the last 12 monthso 0 No Have you had any fall that resulted in injury in the last 12 monthso 0 No FALLS RISK SCREEN History of falling - immediate or within 3 months 0 No Secondary diagnosis (Do you have 2 or more medical diagnoseso) 0 No Ambulatory aid None/bed rest/wheelchair/nurse 0 No Crutches/cane/walker 15 Yes Furniture 0 No Intravenous therapy Access/Saline/Heparin Lock 0 No Gait/Transferring Normal/ bed rest/ wheelchair 0 Yes Weak (short steps with or without shuffle,  stooped but able to lift head while walking, may 0 No seek support from furniture) Impaired (short steps with shuffle, may have difficulty arising from chair, head down, impaired 0 No balance) Mental Status Oriented to own ability 0 Yes Electronic Signature(s) Signed: 01/06/2021 2:35:55 PM By: Donnamarie Poag Entered ByDonnamarie Poag on 01/06/2021 08:53:55 Kristin [name], [name] ([id number]) -------------------------------------------------------------------------------- Foot Assessment Details Patient Name: Kristin [name], [name] Date of Service: 01/06/2021 8:30 AM Medical Record Number: [id number] Patient Account Number: [id number] Date of Birth/Sex: 07-23-1930 (87 y.o. F) Treating RN: Donnamarie Poag Primary Care Lajoy Vanamb[place]: [name] Other Clinician: Referring Grayson White: Eulogio Ditch Treating Mitsuye Schrodt/Extender: [name] in Treatment: 0 Foot Assessment Items Site Locations + = Sensation present, - = Sensation absent, C = Callus, U = Ulcer R = Redness, W = Warmth, M = Maceration, PU = Pre-ulcerative lesion F = Fissure, S = Swelling, D = Dryness Assessment Right: Left: Other Deformity: No No Prior Foot Ulcer: No No Prior Amputation: No No Charcot Joint: No No Ambulatory Status: Ambulatory Without Help Gait:  Steady Electronic Signature(s) Signed: 01/06/2021 2:35:55 PM By: Donnamarie Poag Entered ByDonnamarie Poag on 01/06/2021 08:57:05 Kristin [name], [name] ([id number]) -------------------------------------------------------------------------------- Nutrition Risk Screening Details Patient Name: Kristin [name], [name] Date of Service: 01/06/2021 8:30 AM Medical Record Number: [id number] Patient Account Number: [id number] Date of Birth/Sex: 01-28-1930 (87 y.o. F) Treating RN: Donnamarie Poag Primary Care Lamonte Hartt: [name] Other Clinician: Referring Lydon Vansickle: Eulogio Ditch Treating Marceil Welp/Extender: [name] in Treatment: 0 Height (in): 67 Weight (lbs): 147 Body Mass Index (BMI): 23 Nutrition Risk Screening Items Score Screening NUTRITION RISK SCREEN: I have an illness or condition that made me change the kind and/or amount of food I eat 0 No I eat fewer than two meals per day 0 No I eat few fruits and vegetables, or milk products 0 No I have three or more drinks of beer, liquor or wine almost every day 0 No I have tooth or mouth problems that make it hard for me to eat 0 No I don't always have enough money to buy the food I need 0 No I eat alone most of the time 0 No I take three or more different prescribed or over-the-counter drugs a day 1 Yes Without wanting to, I have lost or gained 10 pounds in the last six months 0 No I am not always physically able to shop, cook and/or feed myself 0 No Nutrition Protocols Good Risk Protocol 0 No interventions needed Moderate Risk Protocol High Risk Proctocol Risk Level: Good Risk Score: 1 Electronic Signature(s) Signed: 01/06/2021 2:35:55 PM By: Donnamarie Poag Entered ByDonnamarie Poag on 01/06/2021 08:53:20

## 2021-01-07 ENCOUNTER — Encounter: Payer: Medicare HMO | Admitting: Pain Medicine

## 2021-01-07 DIAGNOSIS — N189 Chronic kidney disease, unspecified: Secondary | ICD-10-CM | POA: Diagnosis not present

## 2021-01-07 DIAGNOSIS — G9009 Other idiopathic peripheral autonomic neuropathy: Secondary | ICD-10-CM | POA: Diagnosis not present

## 2021-01-07 DIAGNOSIS — L97522 Non-pressure chronic ulcer of other part of left foot with fat layer exposed: Secondary | ICD-10-CM | POA: Diagnosis not present

## 2021-01-07 DIAGNOSIS — I480 Paroxysmal atrial fibrillation: Secondary | ICD-10-CM | POA: Diagnosis not present

## 2021-01-07 DIAGNOSIS — Z7901 Long term (current) use of anticoagulants: Secondary | ICD-10-CM | POA: Diagnosis not present

## 2021-01-07 DIAGNOSIS — L97322 Non-pressure chronic ulcer of left ankle with fat layer exposed: Secondary | ICD-10-CM | POA: Diagnosis not present

## 2021-01-08 ENCOUNTER — Ambulatory Visit
Admission: RE | Admit: 2021-01-08 | Discharge: 2021-01-08 | Disposition: A | Discharge status: Home / Self Care | Payer: Medicare HMO | Source: Ambulatory Visit | Attending: Specialist | Admitting: Specialist

## 2021-01-08 ENCOUNTER — Other Ambulatory Visit: Payer: Self-pay

## 2021-01-08 DIAGNOSIS — R0602 Shortness of breath: Secondary | ICD-10-CM

## 2021-01-08 DIAGNOSIS — J479 Bronchiectasis, uncomplicated: Secondary | ICD-10-CM | POA: Diagnosis not present

## 2021-01-08 DIAGNOSIS — Z8701 Personal history of pneumonia (recurrent): Secondary | ICD-10-CM | POA: Diagnosis not present

## 2021-01-08 DIAGNOSIS — J432 Centrilobular emphysema: Secondary | ICD-10-CM | POA: Diagnosis not present

## 2021-01-08 DIAGNOSIS — I2510 Atherosclerotic heart disease of native coronary artery without angina pectoris: Secondary | ICD-10-CM | POA: Diagnosis not present

## 2021-01-08 DIAGNOSIS — J849 Interstitial pulmonary disease, unspecified: Secondary | ICD-10-CM | POA: Insufficient documentation

## 2021-01-08 DIAGNOSIS — J45998 Other asthma: Secondary | ICD-10-CM | POA: Diagnosis not present

## 2021-01-09 LAB — SURGICAL PATHOLOGY

## 2021-01-10 DIAGNOSIS — J45998 Other asthma: Secondary | ICD-10-CM | POA: Diagnosis not present

## 2021-01-13 ENCOUNTER — Encounter: Payer: Medicare HMO | Admitting: Internal Medicine

## 2021-01-13 ENCOUNTER — Other Ambulatory Visit: Payer: Self-pay

## 2021-01-13 DIAGNOSIS — G9009 Other idiopathic peripheral autonomic neuropathy: Secondary | ICD-10-CM | POA: Diagnosis not present

## 2021-01-13 DIAGNOSIS — I480 Paroxysmal atrial fibrillation: Secondary | ICD-10-CM | POA: Diagnosis not present

## 2021-01-13 DIAGNOSIS — N189 Chronic kidney disease, unspecified: Secondary | ICD-10-CM | POA: Diagnosis not present

## 2021-01-13 DIAGNOSIS — Z7901 Long term (current) use of anticoagulants: Secondary | ICD-10-CM | POA: Diagnosis not present

## 2021-01-13 DIAGNOSIS — G629 Polyneuropathy, unspecified: Secondary | ICD-10-CM | POA: Diagnosis not present

## 2021-01-13 DIAGNOSIS — L97522 Non-pressure chronic ulcer of other part of left foot with fat layer exposed: Secondary | ICD-10-CM | POA: Diagnosis not present

## 2021-01-13 DIAGNOSIS — L97322 Non-pressure chronic ulcer of left ankle with fat layer exposed: Secondary | ICD-10-CM | POA: Diagnosis not present

## 2021-01-15 NOTE — Progress Notes (Signed)
Degrazia, Denaly L. ([id number]) Visit Report for 01/13/2021 HPI Details Patient Name: Kristin [name], [name] Date of Service: 01/13/2021 10:00 AM Medical Record Number: [id number] Patient Account Number: [id number] Date of Birth/Sex: 09/09/1929 (87 y.o. F) Treating RN: Primary Care Provider: [name] Other Clinician: Referring Provider: [name] Treating Provider/Extender: Tito Dine in Treatment: 1 History of Present Illness HPI Description: Left 0.81 Right 0.83 12/25/20 About a year but patient is not sure how long for sure 01/06/2021 upon evaluation today patient presents for initial inspection here in our clinic concerning issues she has been having in 2 areas one being on her left lateral malleolus. This is the most significant of the 2 regions based on what I am seeing today. The other is actually over her left dorsal 3rd toe. With that being said the patient tells me that currently she has been tolerating the dressing changes without complication. There does not appear to be any signs of obvious infection at this time but nonetheless she does not seem to be doing a lot better. She is mainly just been putting a cream on the areas but she is not sure what cream this was when I questioned her about that today she states "it may be on my med list" although I did not see that. The patient is really not a very good historian in general concerning how long this has been going on and what exactly is going on. The patient does have a history of neuropathy, chronic kidney disease, atrial fibrillation and is on long-term anticoagulant therapy as well as aspirin. This includes Eliquis. 5/24; patient has an open area on the left lateral malleolus as well as an area on the base of her third toe which is mycotic also on the left. We have been using Hydrofera Blue as of last week. Biopsy that was done showed chronic stasis dermatitis with areas of healing erosion and overlying  inflammatory crust. Negative for malignancy and negative for fungal organisms. X-ray of the foot did not show anything abnormal except degenerative changes Electronic Signature(s) Signed: 01/15/2021 8:39:06 AM By: Linton Ham MD Entered By: Linton Ham on 01/13/2021 11:40:12 Coolman, Taffy L. ([id number]) -------------------------------------------------------------------------------- Physical Exam Details Patient Name: [name], [name] Date of Service: 01/13/2021 10:00 AM Medical Record Number: [id number] Patient Account Number: [id number] Date of Birth/Sex: 1930/07/16 (87 y.o. F) Treating RN: Primary Care Provider: [name] Other Clinician: Referring Provider: [name] Treating Provider/Extender: Tito Dine in Treatment: 1 Constitutional Sitting or standing Blood Pressure is within target range for patient.. Pulse regular and within target range for patient.Marland Kitchen Respirations regular, non- labored and within target range.. Temperature is normal and within the target range for the patient.Marland Kitchen appears in no distress. Cardiovascular Pedal pulses palpable. Notes Wound exam; odd looking area on the left lateral malleolus. Surface epithelium however there is at least 3 small holes in this and I presume the biopsy site superiorly. There is surrounding inflammation which I am assuming is stasis dermatitis although she does not have a lot of evidence of chronic venous insufficiency. No debridement. The entire area looked moist. Some tenderness around the wound Electronic Signature(s) Signed: 01/15/2021 8:39:06 AM By: Linton Ham MD Entered By: Linton Ham on 01/13/2021 11:45:50 Russett, Keoshia L. ([id number]) -------------------------------------------------------------------------------- Physician Orders Details Patient Name: [name], [name] Date of Service: 01/13/2021 10:00 AM Medical Record Number: [id number] Patient Account Number: [id number] Date of  Birth/Sex: Dec 04, 1929 (87 y.o. F) Treating RN: Cornell Barman Primary Care Provider: [name] Other  Clinician: Referring Provider: [name] Treating Provider/Extender: Tito Dine in Treatment: 1 Verbal / Phone Orders: No Diagnosis Coding Follow-up Appointments o Return Appointment in 1 week. Bathing/ Shower/ Hygiene o May shower with wound dressing protected with water repellent cover or cast protector. o No tub bath. Edema Control - Lymphedema / Segmental Compressive Device / Other Left Lower Extremity o Optional: One layer of unna paste to top of compression wrap (to act as an anchor). o Elevate, Exercise Daily and Avoid Standing for Long Periods of Time. o Elevate legs to the level of the heart and pump ankles as often as possible o Elevate leg(s) parallel to the floor when sitting. Wound Treatment Wound #1 - Malleolus Wound Laterality: Left Cleanser: Byram Ancillary Kit - 15 Day Supply (Generic) 3 x Per Week/30 Days Discharge Instructions: Use supplies as instructed; Kit contains: (15) Saline Bullets; (15) 3x3 Gauze; 15 pr Gloves Cleanser: Normal Saline 3 x Per Week/30 Days Discharge Instructions: Wash your hands with soap and water. Remove old dressing, discard into plastic bag and place into trash. Cleanse the wound with Normal Saline prior to applying a clean dressing using gauze sponges, not tissues or cotton balls. Do not scrub or use excessive force. Pat dry using gauze sponges, not tissue or cotton balls. Topical: Triamcinolone Acetonide Cream, 0.1%, 15 (g) tube 3 x Per Week/30 Days Discharge Instructions: Apply as directed by provider to area around the wound. Primary Dressing: Silvercel Small 2x2 (in/in) 3 x Per Week/30 Days Discharge Instructions: Apply Silvercel Small 2x2 (in/in) as instructed Secondary Dressing: ABD Pad 5x9 (in/in) 3 x Per Week/30 Days Discharge Instructions: Cover with ABD pad Compression Wrap: Profore  Lite LF 3 Multilayer Compression Bandaging System (DME) (Generic) 3 x Per Week/30 Days Discharge Instructions: Apply 3 multi-layer wrap as prescribed. Wound #2 - Toe Third Wound Laterality: Dorsal, Left Cleanser: Byram Ancillary Kit - 15 Day Supply (Generic) 3 x Per Week/30 Days Discharge Instructions: Use supplies as instructed; Kit contains: (15) Saline Bullets; (15) 3x3 Gauze; 15 pr Gloves Cleanser: Normal Saline 3 x Per Week/30 Days Discharge Instructions: Wash your hands with soap and water. Remove old dressing, discard into plastic bag and place into trash. Cleanse the wound with Normal Saline prior to applying a clean dressing using gauze sponges, not tissues or cotton balls. Do not scrub or use excessive force. Pat dry using gauze sponges, not tissue or cotton balls. Primary Dressing: Silvercel Small 2x2 (in/in) 3 x Per Week/30 Days Discharge Instructions: Apply Silvercel Small 2x2 (in/in) as instructed Secondary Dressing: Coverlet Latex-Free Fabric Adhesive Dressings 3 x Per Week/30 Days Discharge Instructions: 1.5 x 2 Electronic Signature(s) Signed: 01/13/2021 5:46:47 PM By: Gretta Cool, BSN, RN, CWS, Kim RN, BSN Minchew, Brezlyn L. ([id number]) Signed: 01/15/2021 8:39:06 AM By: Linton Ham MD Entered By: Gretta Cool, BSN, RN, CWS, Kim on 01/13/2021 10:23:43 Badley, Devri L. ([id number]) -------------------------------------------------------------------------------- Problem List Details Patient Name: Hosman, Natajah L. Date of Service: 01/13/2021 10:00 AM Medical Record Number: [id number] Patient Account Number: [id number] Date of Birth/Sex: 04-25-1930 (87 y.o. F) Treating RN: Primary Care Provider: [name] Other Clinician: Referring Provider: [name] Treating Provider/Extender: Tito Dine in Treatment: 1 Active Problems ICD-10 Encounter Code Description Active Date MDM Diagnosis G90.09 Other idiopathic peripheral autonomic neuropathy 01/06/2021 No  Yes L97.322 Non-pressure chronic ulcer of left ankle with fat layer exposed 01/06/2021 No Yes L97.522 Non-pressure chronic ulcer of other part of left foot with fat layer 01/06/2021 No Yes exposed N18.9 Chronic kidney disease, unspecified  01/06/2021 No Yes I48.0 Paroxysmal atrial fibrillation 01/06/2021 No Yes Z79.01 Long term (current) use of anticoagulants 01/06/2021 No Yes Inactive Problems Resolved Problems Electronic Signature(s) Signed: 01/15/2021 8:39:06 AM By: Linton Ham MD Entered By: Linton Ham on 01/13/2021 11:29:57 Tarango, Minervia L. ([id number]) -------------------------------------------------------------------------------- Progress Note Details Patient Name: Link, Roseanna L. Date of Service: 01/13/2021 10:00 AM Medical Record Number: [id number] Patient Account Number: [id number] Date of Birth/Sex: [birth year] (87 y.o. F) Treating RN: Primary Care Provider: [name] Other Clinician: Referring Provider: [name] Treating Provider/Extender: Tito Dine in Treatment: 1 Subjective History of Present Illness (HPI) Left 0.81 Right 0.83 12/25/20 About a year but patient is not sure how long for sure 01/06/2021 upon evaluation today patient presents for initial inspection here in our clinic concerning issues she has been having in 2 areas one being on her left lateral malleolus. This is the most significant of the 2 regions based on what I am seeing today. The other is actually over her left dorsal 3rd toe. With that being said the patient tells me that currently she has been tolerating the dressing changes without complication. There does not appear to be any signs of obvious infection at this time but nonetheless she does not seem to be doing a lot better. She is mainly just been putting a cream on the areas but she is not sure what cream this was when I questioned her about that today she states "it may be on my med list" although I did not  see that. The patient is really not a very good historian in general concerning how long this has been going on and what exactly is going on. The patient does have a history of neuropathy, chronic kidney disease, atrial fibrillation and is on long-term anticoagulant therapy as well as aspirin. This includes Eliquis. 5/24; patient has an open area on the left lateral malleolus as well as an area on the base of her third toe which is mycotic also on the left. We have been using Hydrofera Blue as of last week. Biopsy that was done showed chronic stasis dermatitis with areas of healing erosion and overlying inflammatory crust. Negative for malignancy and negative for fungal organisms. X-ray of the foot did not show anything abnormal except degenerative changes Objective Constitutional Sitting or standing Blood Pressure is within target range for patient.. Pulse regular and within target range for patient.Marland Kitchen Respirations regular, non- labored and within target range.. Temperature is normal and within the target range for the patient.Marland Kitchen appears in no distress. Vitals Time Taken: 10:00 AM, Height: 67 in, Weight: 147 lbs, BMI: 23, Temperature: 98.4 F, Pulse: 80 bpm, Respiratory Rate: 18 breaths/min, Blood Pressure: 147/81 mmHg. Cardiovascular Pedal pulses palpable. General Notes: Wound exam; odd looking area on the left lateral malleolus. Surface epithelium however there is at least 3 small holes in this and I presume the biopsy site superiorly. There is surrounding inflammation which I am assuming is stasis dermatitis although she does not have a lot of evidence of chronic venous insufficiency. No debridement. The entire area looked moist. Some tenderness around the wound Integumentary (Hair, Skin) Wound #1 status is Open. Original cause of wound was Gradually Appeared. The date acquired was: 09/23/2020. The wound has been in treatment 1 weeks. The wound is located on the Left Malleolus. The wound  measures 2cm length x 1cm width x 0.1cm depth; 1.571cm^2 area and 0.157cm^3 volume. There is Fat Layer (Subcutaneous Tissue) exposed. There is no tunneling or undermining noted.  There is a medium amount of serosanguineous drainage noted. There is small (1-33%) pink granulation within the wound bed. There is a large (67-100%) amount of necrotic tissue within the wound bed including Eschar. Wound #2 status is Open. Original cause of wound was Gradually Appeared. The date acquired was: 04/23/2020. The wound has been in treatment 1 weeks. The wound is located on the Left,Dorsal Toe Third. The wound measures 0.3cm length x 0.7cm width x 0.1cm depth; 0.165cm^2 area and 0.016cm^3 volume. There is Fat Layer (Subcutaneous Tissue) exposed. There is no tunneling or undermining noted. There is a medium amount of serosanguineous drainage noted. There is large (67-100%) red granulation within the wound bed. There is no necrotic tissue within the wound bed. Boulier, Zoiey L. 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 There was a Three Layer Compression Therapy Procedure with a pre-treatment ABI of 0.8 by Cornell Barman, RN. Post procedure Diagnosis Wound #1: Same as Pre-Procedure Plan Follow-up Appointments: Return Appointment in 1 week. Bathing/ Shower/ Hygiene: May shower with wound dressing protected with water repellent cover or cast protector. No tub bath. Edema Control - Lymphedema / Segmental Compressive Device / Other: Optional: One layer of unna paste to top of compression wrap (to  act as an anchor). Elevate, Exercise Daily and Avoid Standing for Long Periods of Time. Elevate legs to the level of the heart and pump ankles as often as possible Elevate leg(s) parallel to the floor when sitting. WOUND #1: - Malleolus Wound Laterality: Left Cleanser: Byram Ancillary Kit - 15 Day Supply (Generic) 3 x Per Week/30 Days Discharge Instructions: Use supplies as instructed; Kit contains: (15) Saline Bullets; (15) 3x3 Gauze; 15 pr Gloves Cleanser: Normal Saline 3 x Per Week/30 Days Discharge Instructions: Wash your hands with soap and water. Remove old dressing, discard into plastic bag and place into trash. Cleanse the wound with Normal Saline prior to applying a clean dressing using gauze sponges, not tissues or cotton balls. Do not scrub or use excessive force. Pat dry using gauze sponges, not tissue or cotton balls. Topical: Triamcinolone Acetonide Cream, 0.1%, 15 (g) tube 3 x Per Week/30 Days Discharge Instructions: Apply as directed by provider to area around the wound. Primary Dressing: Silvercel Small 2x2 (in/in) 3 x Per Week/30 Days Discharge Instructions: Apply Silvercel Small 2x2 (in/in) as instructed Secondary Dressing: ABD Pad 5x9 (in/in) 3 x Per Week/30 Days Discharge Instructions: Cover with ABD pad Compression Wrap: Profore Lite LF 3 Multilayer Compression Bandaging System (DME) (Generic) 3 x Per Week/30 Days Discharge Instructions: Apply 3 multi-layer wrap as prescribed. WOUND #2: - Toe Third Wound Laterality: Dorsal, Left Cleanser: Byram Ancillary Kit - 15 Day Supply (Generic) 3 x Per Week/30 Days Discharge Instructions: Use supplies as instructed; Kit contains: (15) Saline Bullets; (15) 3x3 Gauze; 15 pr Gloves Cleanser: Normal Saline 3 x Per Week/30 Days Discharge Instructions: Wash your hands with soap and water. Remove old dressing, discard into plastic bag and place into trash. Cleanse the wound with Normal Saline prior to applying a clean dressing using  gauze sponges, not tissues or cotton balls. Do not scrub or use excessive force. Pat dry using gauze sponges, not  tissue or cotton balls. Primary Dressing: Silvercel Small 2x2 (in/in) 3 x Per Week/30 Days Discharge Instructions: Apply Silvercel Small 2x2 (in/in) as instructed Secondary Dressing: Coverlet Latex-Free Fabric Adhesive Dressings 3 x Per Week/30 Days Discharge Instructions: 1.5 x 2 #1 biopsy did not suggest malignancy instead stasis dermatitis. This has been a chronic wound there for perhaps 6 to 10 months according to the patient Fly, Burnetta L. ([id number]) 2. The patient appears to have chronic inflammation around the wound which could represent stasis dermatitis although not a lot of evidence of chronic venous insufficiency. I did not order reflux studies today 3. The area appeared moist I therefore change the dressing to silver alginate Liberal TCA around the wound and put this in 3 layer compression to see if this would help. 4. I wondered about the stability of the epithelialized area but I could not prove any undermining 5. One of the small areas was cultured but no empiric antibiotic 6. No obvious arterial issue here 7. The patient has a superficial area at the base of the third toe which has a mycotic nail. This does not look to be infected she really does not know how this happened or how long its been there. Electronic Signature(s) Signed: 01/15/2021 8:39:06 AM By: Linton Ham MD Previous Signature: 01/13/2021 11:51:36 AM Version By: Linton Ham MD Entered By: Linton Ham on 01/13/2021 11:52:58 Caldera, Maybelline L. ([id number]) -------------------------------------------------------------------------------- SuperBill Details Patient Name: Polendo, Notnamed L. Date of Service: 01/13/2021 Medical Record Number: [id number] Patient Account Number: [id number] Date of Birth/Sex: February 09, 1930 (87 y.o. F) Treating RN: Cornell Barman Primary Care Provider: [name] Other  Clinician: Referring Provider: [name] Treating Provider/Extender: Tito Dine in Treatment: 1 Diagnosis Coding ICD-10 Codes Code Description G90.09 Other idiopathic peripheral autonomic neuropathy L97.322 Non-pressure chronic ulcer of left ankle with fat layer exposed L97.522 Non-pressure chronic ulcer of other part of left foot with fat layer exposed N18.9 Chronic kidney disease, unspecified I48.0 Paroxysmal atrial fibrillation Z79.01 Long term (current) use of anticoagulants Facility Procedures CPT4 Code: [id number] Description: (Facility Use Only) [phone] - [facility] LWR LT LEG Modifier: Quantity: 1 Physician Procedures CPT4 Code: 7544920 Description: 10071 - WC PHYS LEVEL 4 - EST PT Modifier: Quantity: 1 CPT4 Code: Description: ICD-10 Diagnosis Description Q19.758 Non-pressure chronic ulcer of left ankle with fat layer exposed L97.522 Non-pressure chronic ulcer of other part of left foot with fat layer ex Modifier: posed Quantity: Electronic Signature(s) Signed: 01/15/2021 8:39:06 AM By: Linton Ham MD Entered By: Linton Ham on 01/13/2021 11:53:07

## 2021-01-15 NOTE — Progress Notes (Signed)
Wolfrey, Shritha L. ([id number]) Visit Report for 01/13/2021 Arrival Information Details Patient Name: Kristin [name], [name] Date of Service: 01/13/2021 10:00 AM Medical Record Number: [id number] Patient Account Number: [id number] Date of Birth/Sex: June 03, 1930 (87 y.o. F) Treating RN: Carlene Coria Primary Care Rorey Bisson: [name] Other Clinician: Referring Maricia Scotti: [name] Treating Amonie Wisser/Extender: Tito Dine in Treatment: 1 Visit Information History Since Last Visit All ordered tests and consults were completed: No Patient Arrived: [name] Added or deleted any medications: No Arrival Time: 09:55 Any new allergies or adverse reactions: No Accompanied By: self Had a fall or experienced change in No Transfer Assistance: None activities of daily living that may affect Patient Identification Verified: Yes risk of falls: Secondary Verification Process Completed: Yes Signs or symptoms of abuse/neglect since last visito No Patient Requires Transmission-Based No Hospitalized since last visit: No Precautions: Implantable device outside of the clinic excluding No Patient Has Alerts: Yes cellular tissue based products placed in the center Patient Alerts: Patient on Blood since last visit: Thinner Has Dressing in Place as Prescribed: Yes ELIQUIS and ASPIRIN Pain Present Now: Yes NOT diabetic ABI left 0.81 _0  ABI right 0.83 _1  Electronic Signature(s) Signed: 01/14/2021 4:53:29 PM By: Carlene Coria RN Entered By: Carlene Coria on 01/13/2021 10:00:15 Jaroszewski, Shareese L. ([id number]) -------------------------------------------------------------------------------- Compression Therapy Details Patient Name: Kristin [name], [name] Date of Service: 01/13/2021 10:00 AM Medical Record Number: [id number] Patient Account Number: [id number] Date of Birth/Sex: [birth year] (87 y.o. F) Treating RN: Cornell Barman Primary Care Kyrie Bun: [name] Other  Clinician: Referring Tieara Flitton: [name] Treating Kym Fenter/Extender: Tito Dine in Treatment: 1 Compression Therapy Performed for Wound Assessment: Wound #1 Left Malleolus Performed By: Clinician Cornell Barman, RN Compression Type: Three Layer Pre Treatment ABI: 0.8 Post Procedure Diagnosis Same as Pre-procedure Electronic Signature(s) Signed: 01/13/2021 5:46:47 PM By: Gretta Cool, BSN, RN, CWS, Kim RN, BSN Entered By: Gretta Cool, BSN, RN, CWS, Kim on 01/13/2021 10:19:28 Neely, Ryker L. ([id number]) -------------------------------------------------------------------------------- Encounter Discharge Information Details Patient Name: Kristin [name], [name] Date of Service: 01/13/2021 10:00 AM Medical Record Number: [id number] Patient Account Number: [id number] Date of Birth/Sex: 01/26/1930 (87 y.o. F) Treating RN: Primary Care Chalsey Leeth: [name] Other Clinician: Referring Mansour Balboa: [name] Treating Keaghan Bowens/Extender: Tito Dine in Treatment: 1 Encounter Discharge Information Items Discharge Condition: Stable Ambulatory Status: Cane Discharge Destination: Home Transportation: Private Auto Accompanied By: self Schedule Follow-up Appointment: Yes Clinical Summary of Care: Electronic Signature(s) Signed: 01/15/2021 4:37:07 PM By: Jeanine Luz Entered By: Jeanine Luz on 01/13/2021 10:45:10 Waddell, Rick L. ([id number]) -------------------------------------------------------------------------------- Lower Extremity Assessment Details Patient Name: Kristin [name], [name] Date of Service: 01/13/2021 10:00 AM Medical Record Number: [id number] Patient Account Number: [id number] Date of Birth/Sex: [birth year] (87 y.o. F) Treating RN: Carlene Coria Primary Care Kindsey Eblin: [name] Other Clinician: Referring Blayre Papania: [name] Treating Mayan Dolney/Extender: Tito Dine in Treatment: 1 Edema Assessment Assessed:  [Left: No] [Right: No] Edema: [Left: N] [Right: o] Calf Left: Right: Point of Measurement: 33 cm From Medial Instep 32 cm Ankle Left: Right: Point of Measurement: 9 cm From Medial Instep 24 cm Vascular Assessment Pulses: Dorsalis Pedis Palpable: [Left:Yes] Electronic Signature(s) Signed: 01/14/2021 4:53:29 PM By: Carlene Coria RN Entered By: Carlene Coria on 01/13/2021 10:08:20 Wyndham, Charmain L. ([id number]) -------------------------------------------------------------------------------- Multi Wound Chart Details Patient Name: Kristin [name], [name] Date of Service: 01/13/2021 10:00 AM Medical Record Number: [id number] Patient Account Number: [id number] Date of Birth/Sex: [birth year] (87 y.o. 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 Comorbid History: Cataracts, Chronic Obstructive Cataracts, Chronic Obstructive N/A Pulmonary Disease (COPD), Pulmonary Disease (COPD), Arrhythmia, Congestive Heart Arrhythmia, Congestive Heart Failure, Coronary Artery Disease, Failure, Coronary Artery Disease, Hypertension, Hepatitis C, History Hypertension, Hepatitis C, History of pressure wounds, Gout, of pressure wounds, Gout, Osteoarthritis, Received Osteoarthritis, Received Chemotherapy, Received Radiation Chemotherapy, Received Radiation Date Acquired: 09/23/2020 04/23/2020 N/A Weeks of Treatment: 1 1 N/A Wound Status: Open Open N/A Measurements L x W x D (cm)  2x1x0.1 0.3x0.7x0.1 N/A Area (cm) : 1.571 0.165 N/A Volume (cm) : 0.157 0.016 N/A % Reduction in Area: -233.50% -108.90% N/A % Reduction in Volume: -234.00% -100.00% N/A Classification: Full Thickness Without Exposed Full Thickness Without Exposed N/A Support Structures Support Structures Exudate Amount: Medium Medium N/A Exudate Type: Serosanguineous Serosanguineous N/A Exudate Color: red, brown red, brown N/A Granulation Amount: Small (1-33%) Large (67-100%) N/A Granulation Quality: Pink Red N/A Necrotic Amount: Large (67-100%) None Present (0%) N/A Necrotic Tissue: Eschar N/A N/A Exposed Structures: Fat Layer (Subcutaneous Tissue): Fat Layer (Subcutaneous Tissue): N/A Yes Yes Fascia: No Fascia: No Tendon: No Tendon: No Muscle: No Muscle: No Joint: No Joint: No Bone: No Bone: No Epithelialization: Small (1-33%) None N/A Procedures Performed: Compression Therapy N/A N/A Treatment Notes Wound #1 (Malleolus) Wound Laterality: Left Blatchford, Timya L. ([id number]) Cleanser Byram Ancillary Kit - 15 Day Supply Discharge Instruction: Use supplies as instructed; Kit contains: (15) Saline Bullets; (15) 3x3 Gauze; 15 pr Gloves Normal Saline Discharge Instruction: Wash your hands with soap and water. Remove old dressing, discard into plastic bag and place into trash. Cleanse the wound with Normal Saline prior to applying a clean dressing using gauze sponges, not tissues or cotton balls. Do not scrub or use excessive force. Pat dry using gauze sponges, not tissue or cotton balls. Peri-Wound Care Topical Triamcinolone Acetonide Cream, 0.1%, 15 (g) tube Discharge Instruction: Apply as directed by Ayme Short to area around the wound. Primary Dressing Silvercel Small 2x2 (in/in) Discharge Instruction: Apply Silvercel Small 2x2 (in/in) as instructed Secondary Dressing ABD Pad 5x9 (in/in) Discharge Instruction: Cover with ABD pad Secured With Compression Wrap Profore Lite LF 3  Multilayer Compression Bandaging System Discharge Instruction: Apply 3 multi-layer wrap as prescribed. Compression Stockings Add-Ons Wound #2 (Toe Third) Wound Laterality: Dorsal, Left Cleanser Byram Ancillary Kit - 15 Day Supply Discharge Instruction: Use supplies as instructed; Kit contains: (15) Saline Bullets; (15) 3x3 Gauze; 15 pr Gloves Normal Saline Discharge Instruction: Wash your hands with soap and water. Remove old dressing, discard into plastic bag and place into trash. Cleanse the wound with Normal Saline prior to applying a clean dressing using gauze sponges, not tissues or cotton balls. Do not scrub or use excessive force. Pat dry using gauze sponges, not tissue or cotton balls. Peri-Wound Care Topical Primary Dressing Silvercel Small 2x2 (in/in) Discharge Instruction: Apply Silvercel Small 2x2 (in/in) as instructed Secondary Dressing Coverlet Latex-Free Fabric Adhesive Dressings Discharge Instruction: 1.5 x 2 Secured With Compression Wrap Compression Stockings Add-Ons Electronic Signature(s) Signed: 01/15/2021 8:39:06 AM By: Linton Ham MD Palomino, Sheela L. ([id number]) Entered By: Linton Ham on 01/13/2021 11:30:05 Spira, Milania L. ([id number]) -------------------------------------------------------------------------------- Mission Details Patient Name: Kristin [name], [name] Date of  Service: 01/13/2021 10:00 AM Medical Record Number: [id number] Patient Account Number: [id number] Date of Birth/Sex: 1930/03/31 (87 y.o. F) Treating RN: Cornell Barman Primary Care Zenab Gronewold: [name] Other Clinician: Referring Esti Demello: [name] Treating Garima Chronis/Extender: Tito Dine in Treatment: 1 Active Inactive Necrotic Tissue Nursing Diagnoses: Impaired tissue integrity related to necrotic/devitalized tissue Goals: Necrotic/devitalized tissue will be minimized in the wound bed Date Initiated: 01/06/2021 Target Resolution  Date: 01/06/2021 Goal Status: Active Patient/caregiver will verbalize understanding of reason and process for debridement of necrotic tissue Date Initiated: 01/06/2021 Target Resolution Date: 01/06/2021 Goal Status: Active Interventions: Assess patient pain level pre-, during and post procedure and prior to discharge Provide education on necrotic tissue and debridement process Treatment Activities: Apply topical anesthetic as ordered : 01/06/2021 Enzymatic debridement : 01/06/2021 Excisional debridement : 01/06/2021 Notes: Orientation to the Wound Care Program Nursing Diagnoses: Knowledge deficit related to the wound healing center program Goals: Patient/caregiver will verbalize understanding of the [facility] Date Initiated: 01/06/2021 Target Resolution Date: 01/06/2021 Goal Status: Active Interventions: Provide education on orientation to the wound center Notes: Wound/Skin Impairment Nursing Diagnoses: Impaired tissue integrity Goals: Patient/caregiver will verbalize understanding of skin care regimen Date Initiated: 01/06/2021 Target Resolution Date: 01/06/2021 Goal Status: Active Ulcer/skin breakdown will have a volume reduction of 30% by week 4 Date Initiated: 01/06/2021 Target Resolution Date: 02/06/2021 Goal Status: Active Ulcer/skin breakdown will have a volume reduction of 50% by week 8 Date Initiated: 01/06/2021 Target Resolution Date: 03/08/2021 Goal Status: Active Codispoti, Blayne L. ([id number]) Ulcer/skin breakdown will have a volume reduction of 80% by week 12 Date Initiated: 01/06/2021 Target Resolution Date: 04/08/2021 Goal Status: Active Ulcer/skin breakdown will heal within 14 weeks Date Initiated: 01/06/2021 Target Resolution Date: 05/09/2021 Goal Status: Active Interventions: Assess patient/caregiver ability to obtain necessary supplies Assess patient/caregiver ability to perform ulcer/skin care regimen upon admission and as needed Assess  ulceration(s) every visit Provide education on ulcer and skin care Treatment Activities: Referred to DME Alfredo Collymore for dressing supplies : 01/06/2021 Skin care regimen initiated : 01/06/2021 Notes: Electronic Signature(s) Signed: 01/13/2021 5:46:47 PM By: Gretta Cool, BSN, RN, CWS, Kim RN, BSN Entered By: Gretta Cool, BSN, RN, CWS, Kim on 01/13/2021 10:16:07 Stankovich, Kamylle L. ([id number]) -------------------------------------------------------------------------------- Pain Assessment Details Patient Name: Kristin [name], [name] Date of Service: 01/13/2021 10:00 AM Medical Record Number: [id number] Patient Account Number: [id number] Date of Birth/Sex: 11/06/1929 (87 y.o. F) Treating RN: Carlene Coria Primary Care Quinterious Walraven: [name] Other Clinician: Referring Jahquez Steffler: [name] Treating Nicholette Dolson/Extender: Tito Dine in Treatment: 1 Active Problems Location of Pain Severity and Description of Pain Patient Has Paino Yes Site Locations With Dressing Change: Yes Duration of the Pain. Constant / Intermittento Constant Rate the pain. Current Pain Level: 5 Worst Pain Level: 5 Least Pain Level: 3 Character of Pain Describe the Pain: Burning Pain Management and Medication Current Pain Management: Medication: Yes Cold Application: No Rest: Yes Massage: No Activity: No T.E.N.S.: No Heat Application: No Leg drop or elevation: No Is the Current Pain Management Adequate: Inadequate How does your wound impact your activities of daily livingo Sleep: No Bathing: No Appetite: No Relationship With Others: No Bladder Continence: No Emotions: No Bowel Continence: No Work: No Toileting: No Drive: No Dressing: No Hobbies: No Electronic Signature(s) Signed: 01/14/2021 4:53:29 PM By: Carlene Coria RN Entered By: Carlene Coria on 01/13/2021 10:01:20 Arman, Bryan L. ([id number]) -------------------------------------------------------------------------------- Patient/Caregiver  Education Details Patient Name: Kristin [name], [name] Date of Service: 01/13/2021 10:00 AM Medical Record Number: [id number] Patient Account  Number: [id number] Date of Birth/Gender: 08/16/1930 (87 y.o. F) Treating RN: Cornell Barman Primary Care Physician: [name] Other Clinician: Referring Physician: [name] Treating Physician/Extender: Tito Dine in Treatment: 1 Education Assessment Education Provided To: Patient Education Topics Provided Venous: Controlling Swelling with Multilayered Compression Wraps, Other: Do not get wet, call the office to be re-wrapped if it slips down Handouts: more than an inch. Methods: Demonstration, Explain/Verbal Responses: State content correctly Wound/Skin Impairment: Handouts: Caring for Your Ulcer Methods: Demonstration, Explain/Verbal Responses: State content correctly Electronic Signature(s) Signed: 01/13/2021 5:46:47 PM By: Gretta Cool, BSN, RN, CWS, Kim RN, BSN Entered By: Gretta Cool, BSN, RN, CWS, Kim on 01/13/2021 10:25:02 Niemeier, Aerilyn L. ([id number]) -------------------------------------------------------------------------------- Wound Assessment Details Patient Name: Kristin [name], [name] Date of Service: 01/13/2021 10:00 AM Medical Record Number: [id number] Patient Account Number: [id number] Date of Birth/Sex: 1929-09-11 (87 y.o. F) Treating RN: Carlene Coria Primary Care Dorna Mallet: [name] Other Clinician: Referring Jullian Previti: [name] Treating Autumn Pruitt/Extender: Tito Dine in Treatment: 1 Wound Status Wound Number: 1 Primary Neuropathic Ulcer-Non Diabetic Etiology: Wound Location: Left Malleolus Wound Open Wounding Event: Gradually Appeared Status: Date Acquired: 09/23/2020 Comorbid Cataracts, Chronic Obstructive Pulmonary Disease (COPD), Weeks Of Treatment: 1 History: Arrhythmia, Congestive Heart Failure, Coronary Artery Clustered Wound: No Disease, Hypertension, Hepatitis C,  History of pressure wounds, Gout, Osteoarthritis, Received Chemotherapy, Received Radiation Photos Wound Measurements Length: (cm) 2 Width: (cm) 1 Depth: (cm) 0.1 Area: (cm) 1.571 Volume: (cm) 0.157 % Reduction in Area: -233.5% % Reduction in Volume: -234% Epithelialization: Small (1-33%) Tunneling: No Undermining: No Wound Description Classification: Full Thickness Without Exposed Support Structu Exudate Amount: Medium Exudate Type: Serosanguineous Exudate Color: red, brown res Foul Odor After Cleansing: No Slough/Fibrino Yes Wound Bed Granulation Amount: Small (1-33%) Exposed Structure Granulation Quality: Pink Fascia Exposed: No Necrotic Amount: Large (67-100%) Fat Layer (Subcutaneous Tissue) Exposed: Yes Necrotic Quality: Eschar Tendon Exposed: No Muscle Exposed: No Joint Exposed: No Bone Exposed: No Treatment Notes Wound #1 (Malleolus) Wound Laterality: Left Cleanser Byram Ancillary Kit - 15 Day Supply Discharge Instruction: Use supplies as instructed; Kit contains: (15) Saline Bullets; (15) 3x3 Gauze; 15 pr Gloves Blaustein, Maygen L. ([id number]) Normal Saline Discharge Instruction: Wash your hands with soap and water. Remove old dressing, discard into plastic bag and place into trash. Cleanse the wound with Normal Saline prior to applying a clean dressing using gauze sponges, not tissues or cotton balls. Do not scrub or use excessive force. Pat dry using gauze sponges, not tissue or cotton balls. Peri-Wound Care Topical Triamcinolone Acetonide Cream, 0.1%, 15 (g) tube Discharge Instruction: Apply as directed by Essa Wenk to area around the wound. Primary Dressing Silvercel Small 2x2 (in/in) Discharge Instruction: Apply Silvercel Small 2x2 (in/in) as instructed Secondary Dressing ABD Pad 5x9 (in/in) Discharge Instruction: Cover with ABD pad Secured With Compression Wrap Profore Lite LF 3 Multilayer Compression Bandaging System Discharge Instruction: Apply 3  multi-layer wrap as prescribed. Compression Stockings Add-Ons Electronic Signature(s) Signed: 01/14/2021 4:53:29 PM By: Carlene Coria RN Entered By: Carlene Coria on 01/13/2021 10:07:01 Loa, Ataya L. ([id number]) -------------------------------------------------------------------------------- Wound Assessment Details Patient Name: Kristin [name], [name] Date of Service: 01/13/2021 10:00 AM Medical Record Number: [id number] Patient Account Number: [id number] Date of Birth/Sex: [birth year] (87 y.o. F) Treating RN: Carlene Coria Primary Care Chon Buhl: [name] Other Clinician: Referring Nathyn Luiz: [name] Treating Charity Tessier/Extender: Tito Dine in Treatment: 1 Wound Status Wound Number: 2 Primary Neuropathic Ulcer-Non Diabetic Etiology: Wound Location: Left, Dorsal Toe Third Wound Open Wounding Event: Gradually Appeared Status:  Date Acquired: 04/23/2020 Comorbid Cataracts, Chronic Obstructive Pulmonary Disease (COPD), Weeks Of Treatment: 1 History: Arrhythmia, Congestive Heart Failure, Coronary Artery Clustered Wound: No Disease, Hypertension, Hepatitis C, History of pressure wounds, Gout, Osteoarthritis, Received Chemotherapy, Received Radiation Photos Wound Measurements Length: (cm) 0.3 Width: (cm) 0.7 Depth: (cm) 0.1 Area: (cm) 0.165 Volume: (cm) 0.016 % Reduction in Area: -108.9% % Reduction in Volume: -100% Epithelialization: None Tunneling: No Undermining: No Wound Description Classification: Full Thickness Without Exposed Support Structures Exudate Amount: Medium Exudate Type: Serosanguineous Exudate Color: red, brown Foul Odor After Cleansing: No Slough/Fibrino No Wound Bed Granulation Amount: Large (67-100%) Exposed Structure Granulation Quality: Red Fascia Exposed: No Necrotic Amount: None Present (0%) Fat Layer (Subcutaneous Tissue) Exposed: Yes Tendon Exposed: No Muscle Exposed: No Joint Exposed: No Bone Exposed:  No Treatment Notes Wound #2 (Toe Third) Wound Laterality: Dorsal, Left Cleanser Byram Ancillary Kit - 15 Day Supply Discharge Instruction: Use supplies as instructed; Kit contains: (15) Saline Bullets; (15) 3x3 Gauze; 15 pr Gloves Shoberg, Meline L. ([id number]) Normal Saline Discharge Instruction: Wash your hands with soap and water. Remove old dressing, discard into plastic bag and place into trash. Cleanse the wound with Normal Saline prior to applying a clean dressing using gauze sponges, not tissues or cotton balls. Do not scrub or use excessive force. Pat dry using gauze sponges, not tissue or cotton balls. Peri-Wound Care Topical Primary Dressing Silvercel Small 2x2 (in/in) Discharge Instruction: Apply Silvercel Small 2x2 (in/in) as instructed Secondary Dressing Coverlet Latex-Free Fabric Adhesive Dressings Discharge Instruction: 1.5 x 2 Secured With Compression Wrap Compression Stockings Add-Ons Electronic Signature(s) Signed: 01/14/2021 4:53:29 PM By: Carlene Coria RN Entered By: Carlene Coria on 01/13/2021 10:07:35 [place], Anetria L. ([id number]) -------------------------------------------------------------------------------- Vitals Details Patient Name: Kristin [name], [name] Date of Service: 01/13/2021 10:00 AM Medical Record Number: [id number] Patient Account Number: [id number] Date of Birth/Sex: [birth year] (87 y.o. F) Treating RN: Carlene Coria Primary Care Mattson Dayal: [name] Other Clinician: Referring Reyann Troop: [name] Treating Jasira Robinson/Extender: Tito Dine in Treatment: 1 Vital Signs Time Taken: 10:00 Temperature (F): 98.4 Height (in): 67 Pulse (bpm): 80 Weight (lbs): 147 Respiratory Rate (breaths/min): 18 Body Mass Index (BMI): 23 Blood Pressure (mmHg): 147/81 Reference Range: 80 - 120 mg / dl Electronic Signature(s) Signed: 01/14/2021 4:53:29 PM By: Carlene Coria RN Entered By: Carlene Coria on 01/13/2021 10:00:30

## 2021-01-16 ENCOUNTER — Other Ambulatory Visit: Payer: Self-pay

## 2021-01-16 DIAGNOSIS — Z7901 Long term (current) use of anticoagulants: Secondary | ICD-10-CM | POA: Diagnosis not present

## 2021-01-16 DIAGNOSIS — I480 Paroxysmal atrial fibrillation: Secondary | ICD-10-CM | POA: Diagnosis not present

## 2021-01-16 DIAGNOSIS — L97322 Non-pressure chronic ulcer of left ankle with fat layer exposed: Secondary | ICD-10-CM | POA: Diagnosis not present

## 2021-01-16 DIAGNOSIS — N189 Chronic kidney disease, unspecified: Secondary | ICD-10-CM | POA: Diagnosis not present

## 2021-01-16 DIAGNOSIS — G9009 Other idiopathic peripheral autonomic neuropathy: Secondary | ICD-10-CM | POA: Diagnosis not present

## 2021-01-16 DIAGNOSIS — L97522 Non-pressure chronic ulcer of other part of left foot with fat layer exposed: Secondary | ICD-10-CM | POA: Diagnosis not present

## 2021-01-16 NOTE — Progress Notes (Signed)
Ehly, Shaynah L. ([id number]) Visit Report for 01/16/2021 Arrival Information Details Patient Name: Kristin [name], [name] Date of Service: 01/16/2021 11:30 AM Medical Record Number: [id number] Patient Account Number: [id number] Date of Birth/Sex: 04/17/1930 (87 y.o. F) Treating RN: Dolan Amen Primary Care Aarushi Hemric: [name] Other Clinician: Referring Tennyson Kallen: [name] Treating Nasiyah Laverdiere/Extender: Tito Dine in Treatment: 1 Visit Information History Since Last Visit Pain Present Now: No Patient Arrived: Cane Arrival Time: 11:29 Accompanied By: self Transfer Assistance: None Patient Identification Verified: Yes Secondary Verification Process Completed: Yes Patient Requires Transmission-Based No Precautions: Patient Has Alerts: Yes Patient Alerts: Patient on Blood Thinner ELIQUIS and ASPIRIN NOT diabetic ABI left 0.81 _0  ABI right 0.83 _1  Electronic Signature(s) Signed: 01/16/2021 4:05:26 PM By: Georges Mouse, Minus Breeding RN Entered By: Georges Mouse, Minus Breeding on 01/16/2021 11:44:37 Feliz, Tam L. ([id number]) -------------------------------------------------------------------------------- Clinic Level of Care Assessment Details Patient Name: Kinker, Allani L. Date of Service: 01/16/2021 11:30 AM Medical Record Number: [id number] Patient Account Number: [id number] Date of Birth/Sex: Jan 26, 1930 (87 y.o. F) Treating RN: Dolan Amen Primary Care Emerick Weatherly: [name] Other Clinician: Referring Ellanie Oppedisano: [name] Treating Vinia Jemmott/Extender: Tito Dine in Treatment: 1 Clinic Level of Care Assessment Items TOOL 1 Quantity Score _2  - Use when EandM and Procedure is performed on INITIAL visit 0 ASSESSMENTS - Nursing Assessment / Reassessment _3  - General Physical Exam (combine w/ comprehensive assessment (listed just below) when performed on new 0 pt. evals) _4  - 0 Comprehensive Assessment (HX, ROS,  Risk Assessments, Wounds Hx, etc.) ASSESSMENTS - Wound and Skin Assessment / Reassessment _5  - Dermatologic / Skin Assessment (not related to wound area) 0 ASSESSMENTS - Ostomy and/or Continence Assessment and Care _6  - Incontinence Assessment and Management 0 _7  - 0 Ostomy Care Assessment and Management (repouching, etc.) PROCESS - Coordination of Care _8  - Simple Patient / Family Education for ongoing care 0 _9  - 0 Complex (extensive) Patient / Family Education for ongoing care _10  - 0 Staff obtains Programmer, systems, Records, Test Results / Process Orders _11  - 0 Staff telephones HHA, Nursing Homes / Clarify orders / etc _12  - 0 Routine Transfer to another Facility (non-emergent condition) _13  - 0 Routine Hospital Admission (non-emergent condition) _14  - 0 New Admissions / Biomedical engineer / Ordering NPWT, Apligraf, etc. _15  - 0 Emergency Hospital Admission (emergent condition) PROCESS - Special Needs _16  - Pediatric / Minor Patient Management 0 _17  - 0 Isolation Patient Management _18  - 0 Hearing / Language / Visual special needs _19  - 0 Assessment of Community assistance (transportation, D/C planning, etc.) _20  - 0 Additional assistance / Altered mentation _21  - 0 Support Surface(s) Assessment (bed, cushion, seat, etc.) INTERVENTIONS - Miscellaneous _22  - External ear exam 0 _23  - 0 Patient Transfer (multiple staff / Civil Service fast streamer / Similar devices) _24  - 0 Simple Staple / Suture removal (25 or less) _25  - 0 Complex Staple / Suture removal (26 or more) _26  - 0 Hypo/Hyperglycemic Management (do not check if billed separately) _27  - 0 Ankle / Brachial Index (ABI) - do not check if billed separately Has the patient been seen at the hospital within the last three years: Yes Total Score: 0 Level Of Care: ____ Roel, Patrycja L. ([id number]) Electronic Signature(s) Signed: 01/16/2021 4:05:26 PM By: Georges Mouse, Minus Breeding RN Entered By: Georges Mouse, Minus Breeding on 01/16/2021 11:45:41 Whitcher,  Marshia L. ([id number]) -------------------------------------------------------------------------------- Compression Therapy Details Patient Name: [name], [name] Date of Service: 01/16/2021 11:30 AM Medical Record Number: [id number] Patient Account Number: [id number] Date of Birth/Sex:  March 08, 1930 (87 y.o. F) Treating RN: Dolan Amen Primary Care Jennell Janosik: [name] Other Clinician: Referring Guliana Weyandt: [name] Treating Kimberly Nieland/Extender: Tito Dine in Treatment: 1 Compression Therapy Performed for Wound Assessment: Wound #1 Left Malleolus Performed By: Cora Daniels, RN Compression Type: Three Layer Notes pt tolerating wrap well Electronic Signature(s) Signed: 01/16/2021 4:05:26 PM By: Georges Mouse, Minus Breeding RN Entered By: Georges Mouse, Minus Breeding on 01/16/2021 11:45:13 Lines, Nayelis L. ([id number]) -------------------------------------------------------------------------------- Encounter Discharge Information Details Patient Name: Sadowski, Rashiya L. Date of Service: 01/16/2021 11:30 AM Medical Record Number: [id number] Patient Account Number: [id number] Date of Birth/Sex: 1930-01-11 (87 y.o. F) Treating RN: Dolan Amen Primary Care Mak Bonny: [name] Other Clinician: Referring Kessa Fairbairn: [name] Treating Seira Cody/Extender: Tito Dine in Treatment: 1 Encounter Discharge Information Items Discharge Condition: Stable Ambulatory Status: Cane Discharge Destination: Home Transportation: Private Auto Accompanied By: self Schedule Follow-up Appointment: Yes Clinical Summary of Care: Electronic Signature(s) Signed: 01/16/2021 4:05:26 PM By: Georges Mouse, Minus Breeding RN Entered By: Georges Mouse, Minus Breeding on 01/16/2021 11:45:36 Lezotte, Lucas L. ([id number]) -------------------------------------------------------------------------------- Wound Assessment Details Patient Name: Falck, Hannahmarie L. Date of Service:  01/16/2021 11:30 AM Medical Record Number: [id number] Patient Account Number: [id number] Date of Birth/Sex: 10/25/1929 (87 y.o. 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 Wound Status: Open Wounding Event: Gradually Appeared Date Acquired: 09/23/2020 Weeks Of Treatment: 1 Clustered Wound: No Wound Measurements Length: (cm) 2 Width: (cm) 1 Depth: (cm) 0.1 Area: (cm) 1.571 Volume: (cm) 0.157 % Reduction in Area: -233.5% % Reduction in Volume: -234% Wound Description Classification: Full Thickness Without Exposed Support Structure s Treatment Notes Wound #1 (Malleolus) Wound Laterality: Left Cleanser Byram Ancillary Kit - 15 Day Supply Discharge Instruction: Use supplies as instructed; Kit contains: (15) Saline Bullets; (15) 3x3 Gauze; 15 pr Gloves Normal Saline Discharge Instruction: Wash your hands with soap and water. Remove old dressing, discard into plastic bag and place into trash. Cleanse the wound with Normal Saline prior to applying a clean dressing using gauze sponges, not tissues or cotton balls. Do not scrub or use excessive force. Pat dry using gauze sponges, not tissue or cotton balls. Peri-Wound Care Topical Triamcinolone Acetonide Cream, 0.1%, 15 (g) tube Discharge Instruction: Apply as directed by Esequiel Kleinfelter to area around the wound. Primary Dressing Silvercel Small 2x2 (in/in) Discharge Instruction: Apply Silvercel Small 2x2 (in/in) as instructed Secondary Dressing ABD Pad 5x9 (in/in) Discharge Instruction: Cover with ABD pad Secured With Compression Wrap Profore Lite LF 3 Multilayer Compression Bandaging System Discharge Instruction: Apply 3 multi-layer wrap as prescribed. Compression  Stockings Add-Ons Electronic Signature(s) Nanninga, Maritza L. ([id number]) Signed: 01/16/2021 4:05:26 PM By: Georges Mouse, Minus Breeding RN Entered By: Georges Mouse, Minus Breeding on 01/16/2021 11:44:55 Glazebrook, Kenyada L. ([id number]) -------------------------------------------------------------------------------- Wound Assessment Details Patient Name: Sianez, Dezaray L. Date of Service: 01/16/2021 11:30 AM Medical Record Number: [id number] Patient Account Number: [id number] Date of Birth/Sex: [birth year] (87 y.o. F) Treating RN: Dolan Amen Primary Care Tabb Croghan: [name] Other Clinician: Referring Breasia Karges: [name] Treating Julyssa Kyer/Extender: Tito Dine in Treatment: 1 Wound Status Wound Number: 2 Primary Etiology: Neuropathic Ulcer-Non Diabetic Wound Location: Left, Dorsal Toe Third Wound Status: Open Wounding Event: Gradually Appeared Date Acquired: 04/23/2020 Weeks Of Treatment: 1 Clustered Wound: No Wound Measurements Length: (cm) 0.3 Width: (cm) 0.7 Depth: (cm) 0.1 Area: (cm) 0.165 Volume: (cm) 0.016 %  Reduction in Area: -108.9% % Reduction in Volume: -100% Wound Description Classification: Full Thickness Without Exposed Support Structure s Treatment Notes Wound #2 (Toe Third) Wound Laterality: Dorsal, Left Cleanser Byram Ancillary Kit - 15 Day Supply Discharge Instruction: Use supplies as instructed; Kit contains: (15) Saline Bullets; (15) 3x3 Gauze; 15 pr Gloves Normal Saline Discharge Instruction: Wash your hands with soap and water. Remove old dressing, discard into plastic bag and place into trash. Cleanse the wound with Normal Saline prior to applying a clean dressing using gauze sponges, not tissues or cotton balls. Do not scrub or use excessive force. Pat dry using gauze sponges, not tissue or cotton balls. Peri-Wound Care Topical Primary Dressing Silvercel Small 2x2 (in/in) Discharge Instruction: Apply Silvercel Small 2x2 (in/in) as  instructed Secondary Dressing Coverlet Latex-Free Fabric Adhesive Dressings Discharge Instruction: 1.5 x 2 Secured With Compression Wrap Compression Stockings Add-Ons Electronic Signature(s) Signed: 01/16/2021 4:05:26 PM By: Georges Mouse, Minus Breeding RN Entered By: Georges Mouse, Minus Breeding on 01/16/2021 11:44:55

## 2021-01-21 ENCOUNTER — Encounter: Payer: Medicare HMO | Attending: Internal Medicine | Admitting: Internal Medicine

## 2021-01-21 ENCOUNTER — Other Ambulatory Visit: Payer: Self-pay

## 2021-01-21 DIAGNOSIS — I480 Paroxysmal atrial fibrillation: Secondary | ICD-10-CM | POA: Diagnosis not present

## 2021-01-21 DIAGNOSIS — L97522 Non-pressure chronic ulcer of other part of left foot with fat layer exposed: Secondary | ICD-10-CM | POA: Insufficient documentation

## 2021-01-21 DIAGNOSIS — Z7901 Long term (current) use of anticoagulants: Secondary | ICD-10-CM | POA: Insufficient documentation

## 2021-01-21 DIAGNOSIS — I872 Venous insufficiency (chronic) (peripheral): Secondary | ICD-10-CM | POA: Diagnosis not present

## 2021-01-21 DIAGNOSIS — N189 Chronic kidney disease, unspecified: Secondary | ICD-10-CM | POA: Diagnosis not present

## 2021-01-21 DIAGNOSIS — G9009 Other idiopathic peripheral autonomic neuropathy: Secondary | ICD-10-CM | POA: Insufficient documentation

## 2021-01-21 DIAGNOSIS — L97322 Non-pressure chronic ulcer of left ankle with fat layer exposed: Secondary | ICD-10-CM | POA: Insufficient documentation

## 2021-01-22 DIAGNOSIS — R0600 Dyspnea, unspecified: Secondary | ICD-10-CM | POA: Diagnosis not present

## 2021-01-22 DIAGNOSIS — J439 Emphysema, unspecified: Secondary | ICD-10-CM | POA: Diagnosis not present

## 2021-01-22 DIAGNOSIS — J84112 Idiopathic pulmonary fibrosis: Secondary | ICD-10-CM | POA: Diagnosis not present

## 2021-01-22 NOTE — Progress Notes (Signed)
Retherford, Lainie L. ([id number]) Visit Report for 01/21/2021 Arrival Information Details Patient Name: Degraff, Deneshia L. Date of Service: 01/21/2021 1:30 PM Medical Record Number: [id number] Patient Account Number: [id number] Date of Birth/Sex: April 15, 1930 (87 y.o. F) Treating RN: Donnamarie Poag Primary Care Morrisa Aldaba: [name] Other Clinician: Referring Kaydi Kley: [name] Treating Christopherjame Carnell/Extender: Tito Dine in Treatment: 2 Visit Information History Since Last Visit Added or deleted any medications: No Patient Arrived: [name] Had a fall or experienced change in No Arrival Time: 13:27 activities of daily living that may affect Accompanied By: self risk of falls: Transfer Assistance: None Hospitalized since last visit: No Patient Identification Verified: Yes Has Dressing in Place as Prescribed: Yes Secondary Verification Process Completed: Yes Has Compression in Place as Prescribed: Yes Patient Requires Transmission-Based No Pain Present Now: Yes Precautions: Patient Has Alerts: Yes Patient Alerts: Patient on Blood Thinner ELIQUIS and ASPIRIN NOT diabetic ABI left 0.81 @vascular  ABI right 0.83 @vascular  Electronic Signature(s) Signed: 01/22/2021 9:13:32 AM By: Donnamarie Poag Entered ByDonnamarie Poag on 01/21/2021 13:31:20 Segers, Khalea L. ([id number]) -------------------------------------------------------------------------------- Clinic Level of Care Assessment Details Patient Name: Val, Ronneisha L. Date of Service: 01/21/2021 1:30 PM Medical Record Number: [id number] Patient Account Number: [id number] Date of Birth/Sex: [birth year] (87 y.o. F) Treating RN: Dolan Amen Primary Care Mera Gunkel: [name] Other Clinician: Referring Naoko Diperna: [name] Treating Alizia Greif/Extender: Tito Dine in Treatment: 2 Clinic Level of Care Assessment Items TOOL 1 Quantity Score []  - Use when EandM and Procedure is performed on  INITIAL visit 0 ASSESSMENTS - Nursing Assessment / Reassessment []  - General Physical Exam (combine w/ comprehensive assessment (listed just below) when performed on new 0 pt. evals) []  - 0 Comprehensive Assessment (HX, ROS, Risk Assessments, Wounds Hx, etc.) ASSESSMENTS - Wound and Skin Assessment / Reassessment []  - Dermatologic / Skin Assessment (not related to wound area) 0 ASSESSMENTS - Ostomy and/or Continence Assessment and Care []  - Incontinence Assessment and Management 0 []  - 0 Ostomy Care Assessment and Management (repouching, etc.) PROCESS - Coordination of Care []  - Simple Patient / Family Education for ongoing care 0 []  - 0 Complex (extensive) Patient / Family Education for ongoing care []  - 0 Staff obtains Programmer, systems, Records, Test Results / Process Orders []  - 0 Staff telephones HHA, Nursing Homes / Clarify orders / etc []  - 0 Routine Transfer to another Facility (non-emergent condition) []  - 0 Routine Hospital Admission (non-emergent condition) []  - 0 New Admissions / Biomedical engineer / Ordering NPWT, Apligraf, etc. []  - 0 Emergency Hospital Admission (emergent condition) PROCESS - Special Needs []  - Pediatric / Minor Patient Management 0 []  - 0 Isolation Patient Management []  - 0 Hearing / Language / Visual special needs []  - 0 Assessment of Community assistance (transportation, D/C planning, etc.) []  - 0 Additional assistance / Altered mentation []  - 0 Support Surface(s) Assessment (bed, cushion, seat, etc.) INTERVENTIONS - Miscellaneous []  - External ear exam 0 []  - 0 Patient Transfer (multiple staff / Civil Service fast streamer / Similar devices) []  - 0 Simple Staple / Suture removal (25 or less) []  - 0 Complex Staple / Suture removal (26 or more) []  - 0 Hypo/Hyperglycemic Management (do not check if billed separately) []  - 0 Ankle / Brachial Index (ABI) - do not check if billed separately Has the patient been seen at the hospital within the last three  years: Yes Total Score: 0 Level Of Care: ____ Sheu, Brizeida L. ([id number]) Electronic Signature(s) Signed: 01/21/2021 4:52:16 PM By: Georges Mouse,  Kenia RN Entered By: Georges Mouse, Minus Breeding on 01/21/2021 14:17:31 Balboni, Laquandra L. ([id number]) -------------------------------------------------------------------------------- Encounter Discharge Information Details Patient Name: Steen, Ennis L. Date of Service: 01/21/2021 1:30 PM Medical Record Number: [id number] Patient Account Number: [id number] Date of Birth/Sex: 1930-05-13 (87 y.o. F) Treating RN: Carlene Coria Primary Care Stephens Shreve: [name] Other Clinician: Referring Chantelle Verdi: [name] Treating Lucill Mauck/Extender: Tito Dine in Treatment: 2 Encounter Discharge Information Items Post Procedure Vitals Discharge Condition: Stable Temperature (F): 97.9 Ambulatory Status: Cane Pulse (bpm): 81 Discharge Destination: Home Respiratory Rate (breaths/min): 18 Transportation: Private Auto Blood Pressure (mmHg): 118/69 Accompanied By: self Schedule Follow-up Appointment: Yes Clinical Summary of Care: Patient Declined Electronic Signature(s) Signed: 01/22/2021 4:50:12 PM By: Carlene Coria RN Entered By: Carlene Coria on 01/21/2021 14:30:15 Hartsfield, Danette L. ([id number]) -------------------------------------------------------------------------------- Lower Extremity Assessment Details Patient Name: Beth, Eudelia L. Date of Service: 01/21/2021 1:30 PM Medical Record Number: [id number] Patient Account Number: [id number] Date of Birth/Sex: 04/27/1930 (87 y.o. F) Treating RN: Donnamarie Poag Primary Care Ashlye Oviedo: [name] Other Clinician: Referring Brion Sossamon: [name] Treating Wilho Sharpley/Extender: Tito Dine in Treatment: 2 Edema Assessment Assessed: [Left: Yes] [Right: No] Edema: [Left: N] [Right: o] Calf Left: Right: Point of Measurement: 33 cm From Medial Instep 32  cm Ankle Left: Right: Point of Measurement: 9 cm From Medial Instep 21 cm Knee To Floor Left: Right: From Medial Instep 45 cm Vascular Assessment Pulses: Dorsalis Pedis Palpable: [Left:Yes] Electronic Signature(s) Signed: 01/22/2021 9:13:32 AM By: Donnamarie Poag Entered ByDonnamarie Poag on 01/21/2021 13:40:32 Teng, Jhane L. ([id number]) -------------------------------------------------------------------------------- Multi Wound Chart Details Patient Name: Theall, Cana L. Date of Service: 01/21/2021 1:30 PM Medical Record Number: [id number] Patient Account Number: [id number] Date of Birth/Sex: [birth year] (87 y.o. 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 Comorbid History: Cataracts, Chronic Obstructive Cataracts, Chronic Obstructive N/A Pulmonary Disease (COPD), Pulmonary Disease (COPD), Arrhythmia, Congestive Heart Arrhythmia, Congestive Heart Failure, Coronary Artery Disease, Failure, Coronary Artery Disease, Hypertension, Hepatitis C, History Hypertension, Hepatitis C, History of pressure wounds, Gout, of pressure wounds, Gout, Osteoarthritis, Received Osteoarthritis, Received Chemotherapy, Received Radiation Chemotherapy, Received Radiation Date Acquired: 09/23/2020 04/23/2020 N/A Weeks of Treatment: 2 2 N/A Wound Status: Open Open N/A Measurements L x W x D (cm) 1.5x1x0.1 0.2x0.3x0.1 N/A Area (cm) : 1.178 0.047 N/A Volume (cm) : 0.118  0.005 N/A % Reduction in Area: -150.10% 40.50% N/A % Reduction in Volume: -151.10% 37.50% N/A Classification: Full Thickness Without Exposed Full Thickness Without Exposed N/A Support Structures Support Structures Exudate Amount: Medium Small N/A Exudate Type: Serosanguineous Serosanguineous N/A Exudate Color: red, brown red, brown N/A Granulation Amount: None Present (0%) Medium (34-66%) N/A Granulation Quality: N/A Pink N/A Necrotic Amount: Large (67-100%) Medium (34-66%) N/A Necrotic Tissue: Eschar Eschar, Adherent Slough N/A Exposed Structures: Fat Layer (Subcutaneous Tissue): Fat Layer (Subcutaneous Tissue): N/A Yes Yes Fascia: No Fascia: No Tendon: No Tendon: No Muscle: No Muscle: No Joint: No Joint: No Bone: No Bone: No Epithelialization: Small (1-33%) Small (1-33%) N/A Debridement: Debridement - Selective/Open Debridement - Selective/Open N/A Wound Wound Pre-procedure Verification/Time 14:15 14:15 N/A Out Taken: Tissue Debrided: Necrotic/Eschar Callus N/A Level: Skin/Epidermis Skin/Epidermis N/A  Debridement Area (sq cm): 1.5 0.06 N/A Esteve, Yania L. ([id number]) Instrument: Curette Curette N/A Bleeding: None None N/A Debridement Treatment Procedure was tolerated well Procedure was tolerated well N/A Response: Post Debridement 1.5x1x0.1 0.2x0.3x0.1 N/A Measurements L x W x D (cm) Post Debridement Volume: 0.118 0.005 N/A (cm) Procedures Performed: Debridement Debridement N/A Treatment Notes Wound #1 (Malleolus) Wound Laterality: Left Cleanser Normal Saline Discharge Instruction: Wash your hands with soap and water. Remove old dressing, discard into plastic bag and place into trash. Cleanse the wound with Normal Saline prior to applying a clean dressing using gauze sponges, not tissues or cotton balls. Do not scrub or use excessive force. Pat dry using gauze sponges, not tissue or cotton balls. Peri-Wound Care Topical Primary Dressing Silvercel Small 2x2  (in/in) Discharge Instruction: Apply Silvercel Small 2x2 (in/in) as instructed Secondary Dressing ABD Pad 5x9 (in/in) Discharge Instruction: Cover with ABD pad Secured With Compression Wrap Profore Lite LF 3 Multilayer Compression Bandaging System Discharge Instruction: Apply 3 multi-layer wrap as prescribed. Compression Stockings Add-Ons Wound #2 (Toe Third) Wound Laterality: Dorsal, Left Cleanser Normal Saline Discharge Instruction: Wash your hands with soap and water. Remove old dressing, discard into plastic bag and place into trash. Cleanse the wound with Normal Saline prior to applying a clean dressing using gauze sponges, not tissues or cotton balls. Do not scrub or use excessive force. Pat dry using gauze sponges, not tissue or cotton balls. Peri-Wound Care Topical Primary Dressing Silvercel Small 2x2 (in/in) Discharge Instruction: Apply Silvercel Small 2x2 (in/in) as instructed Secondary Dressing Coverlet Latex-Free Fabric Adhesive Dressings Discharge Instruction: 1.5 x 2 Secured With Compression Wrap Compression Stockings Add-Ons Mcglynn, Rosalinda L. ([id number]) Electronic Signature(s) Signed: 01/21/2021 5:08:05 PM By: Linton Ham MD Entered By: Linton Ham on 01/21/2021 14:40:58 Christenberry, Jerzey L. ([id number]) -------------------------------------------------------------------------------- Brewer Details Patient Name: Yount, Mercer L. Date of Service: 01/21/2021 1:30 PM Medical Record Number: [id number] Patient Account Number: [id number] Date of Birth/Sex: [birth year] (87 y.o. F) Treating RN: Dolan Amen Primary Care Noe Pittsley: [name] Other Clinician: Referring Tvisha Schwoerer: [name] Treating Shmiel Morton/Extender: Tito Dine in Treatment: 2 Active Inactive Necrotic Tissue Nursing Diagnoses: Impaired tissue integrity related to necrotic/devitalized tissue Goals: Necrotic/devitalized tissue will be minimized  in the wound bed Date Initiated: 01/06/2021 Target Resolution Date: 01/06/2021 Goal Status: Active Patient/caregiver will verbalize understanding of reason and process for debridement of necrotic tissue Date Initiated: 01/06/2021 Target Resolution Date: 01/06/2021 Goal Status: Active Interventions: Assess patient pain level pre-, during and post procedure and prior to discharge Provide education on necrotic tissue and debridement process Treatment Activities: Apply topical anesthetic as ordered : 01/06/2021 Enzymatic debridement : 01/06/2021 Excisional debridement : 01/06/2021 Notes: Wound/Skin Impairment Nursing Diagnoses: Impaired tissue integrity Goals: Patient/caregiver will verbalize understanding of skin care regimen Date Initiated: 01/06/2021 Target Resolution Date: 01/06/2021 Goal Status: Active Ulcer/skin breakdown will have a volume reduction of 30% by week 4 Date Initiated: 01/06/2021 Target Resolution Date: 02/06/2021 Goal Status: Active Ulcer/skin breakdown will have a volume reduction of 50% by week 8 Date Initiated: 01/06/2021 Target Resolution Date: 03/08/2021 Goal Status: Active Ulcer/skin breakdown will have a volume reduction of 80% by week 12 Date Initiated: 01/06/2021 Target Resolution Date: 04/08/2021 Goal Status: Active Ulcer/skin breakdown will heal within 14 weeks Date Initiated: 01/06/2021 Target Resolution Date: 05/09/2021 Goal Status: Active Interventions: Assess patient/caregiver ability to obtain necessary supplies Assess patient/caregiver ability to perform ulcer/skin care regimen upon admission and as needed Assess ulceration(s) every visit Provide education on ulcer and skin care  Treatment Activities: Bieri, [place] ([id number]) Referred to DME Muhanad Torosyan for dressing supplies : 01/06/2021 Skin care regimen initiated : 01/06/2021 Notes: Electronic Signature(s) Signed: 01/21/2021 4:52:16 PM By: Georges Mouse, Minus Breeding RN Entered By: Georges Mouse, Kenia  on 01/21/2021 14:13:43 Lange, Stepanie L. ([id number]) -------------------------------------------------------------------------------- Pain Assessment Details Patient Name: Stinson, Zora L. Date of Service: 01/21/2021 1:30 PM Medical Record Number: [id number] Patient Account Number: [id number] Date of Birth/Sex: 15-Sep-1929 (87 y.o. F) Treating RN: Donnamarie Poag Primary Care Jose Alleyne: [name] Other Clinician: Referring Katricia Prehn: [name] Treating Meher Kucinski/Extender: Tito Dine in Treatment: 2 Active Problems Location of Pain Severity and Description of Pain Patient Has Paino Yes Site Locations Pain Location: Pain in Ulcers Rate the pain. Current Pain Level: 3 Pain Management and Medication Current Pain Management: Electronic Signature(s) Signed: 01/22/2021 9:13:32 AM By: Donnamarie Poag Entered By: Donnamarie Poag on 01/21/2021 13:33:07 Sansom, Adelyn L. ([id number]) -------------------------------------------------------------------------------- Patient/Caregiver Education Details Patient Name: Berkowitz, Rylee L. Date of Service: 01/21/2021 1:30 PM Medical Record Number: [id number] Patient Account Number: [id number] Date of Birth/Gender: Oct 31, 1929 (87 y.o. F) Treating RN: Dolan Amen Primary Care Physician: [name] Other Clinician: Referring Physician: [name] Treating Physician/Extender: Tito Dine in Treatment: 2 Education Assessment Education Provided To: Patient Education Topics Provided Wound Debridement: Methods: Explain/Verbal Responses: State content correctly Wound/Skin Impairment: Methods: Explain/Verbal Responses: State content correctly Electronic Signature(s) Signed: 01/21/2021 4:52:16 PM By: Georges Mouse, Minus Breeding RN Entered By: Georges Mouse, Minus Breeding on 01/21/2021 14:17:44 Betancur, Alfreda L. ([id number]) -------------------------------------------------------------------------------- Wound Assessment  Details Patient Name: Tine, Latrise L. Date of Service: 01/21/2021 1:30 PM Medical Record Number: [id number] Patient Account Number: [id number] Date of Birth/Sex: 12-03-1929 (87 y.o. F) Treating RN: Donnamarie Poag Primary Care Nicky Kras: [name] Other Clinician: Referring Reannon Candella: [name] Treating Frisco Cordts/Extender: Tito Dine in Treatment: 2 Wound Status Wound Number: 1 Primary Neuropathic Ulcer-Non Diabetic Etiology: Wound Location: Left Malleolus Wound Open Wounding Event: Gradually Appeared Status: Date Acquired: 09/23/2020 Comorbid Cataracts, Chronic Obstructive Pulmonary Disease (COPD), Weeks Of Treatment: 2 History: Arrhythmia, Congestive Heart Failure, Coronary Artery Clustered Wound: No Disease, Hypertension, Hepatitis C, History of pressure wounds, Gout, Osteoarthritis, Received Chemotherapy, Received Radiation Photos Wound Measurements Length: (cm) 1.5 Width: (cm) 1 Depth: (cm) 0.1 Area: (cm) 1.178 Volume: (cm) 0.118 % Reduction in Area: -150.1% % Reduction in Volume: -151.1% Epithelialization: Small (1-33%) Tunneling: No Undermining: No Wound Description Classification: Full Thickness Without Exposed Support Structures Exudate Amount: Medium Exudate Type: Serosanguineous Exudate Color: red, brown Foul Odor After Cleansing: No Slough/Fibrino Yes Wound Bed Granulation Amount: None Present (0%) Exposed Structure Necrotic Amount: Large (67-100%) Fascia Exposed: No Necrotic Quality: Eschar Fat Layer (Subcutaneous Tissue) Exposed: Yes Tendon Exposed: No Muscle Exposed: No Joint Exposed: No Bone Exposed: No Treatment Notes Wound #1 (Malleolus) Wound Laterality: Left Cleanser Normal Saline Discharge Instruction: Wash your hands with soap and water. Remove old dressing, discard into plastic bag and place into trash. Cleanse the wound with Normal Saline prior to applying a clean dressing using gauze sponges, not tissues or  cotton balls. Do not Sweetin, Marcille L. ([id number]) scrub or use excessive force. Pat dry using gauze sponges, not tissue or cotton balls. Peri-Wound Care Topical Primary Dressing Silvercel Small 2x2 (in/in) Discharge Instruction: Apply Silvercel Small 2x2 (in/in) as instructed Secondary Dressing ABD Pad 5x9 (in/in) Discharge Instruction: Cover with ABD pad Secured With Compression Wrap Profore Lite LF 3 Multilayer Compression Bandaging System Discharge Instruction: Apply 3 multi-layer wrap as prescribed. Compression Stockings Add-Ons Electronic Signature(s) Signed: 01/22/2021 9:13:32  AM By: [name], [name] Entered ByDonnamarie Poag on 01/21/2021 13:38:02 Pyka, Cruzita L. ([id number]) -------------------------------------------------------------------------------- Wound Assessment Details Patient Name: Whitenack, Arla L. Date of Service: 01/21/2021 1:30 PM Medical Record Number: [id number] Patient Account Number: [id number] Date of Birth/Sex: [birth year] (87 y.o. F) Treating RN: Donnamarie Poag Primary Care Doni Bacha: [name] Other Clinician: Referring Zela Sobieski: [name] Treating Thadius Smisek/Extender: Tito Dine in Treatment: 2 Wound Status Wound Number: 2 Primary Neuropathic Ulcer-Non Diabetic Etiology: Wound Location: Left, Dorsal Toe Third Wound Open Wounding Event: Gradually Appeared Status: Date Acquired: 04/23/2020 Comorbid Cataracts, Chronic Obstructive Pulmonary Disease (COPD), Weeks Of Treatment: 2 History: Arrhythmia, Congestive Heart Failure, Coronary Artery Clustered Wound: No Disease, Hypertension, Hepatitis C, History of pressure wounds, Gout, Osteoarthritis, Received Chemotherapy, Received Radiation Photos Wound Measurements Length: (cm) 0.2 Width: (cm) 0.3 Depth: (cm) 0.1 Area: (cm) 0.047 Volume: (cm) 0.005 % Reduction in Area: 40.5% % Reduction in Volume: 37.5% Epithelialization: Small (1-33%) Tunneling: No Undermining: No Wound  Description Classification: Full Thickness Without Exposed Support Structures Exudate Amount: Small Exudate Type: Serosanguineous Exudate Color: red, brown Foul Odor After Cleansing: No Slough/Fibrino Yes Wound Bed Granulation Amount: Medium (34-66%) Exposed Structure Granulation Quality: Pink Fascia Exposed: No Necrotic Amount: Medium (34-66%) Fat Layer (Subcutaneous Tissue) Exposed: Yes Necrotic Quality: Eschar, Adherent Slough Tendon Exposed: No Muscle Exposed: No Joint Exposed: No Bone Exposed: No Treatment Notes Wound #2 (Toe Third) Wound Laterality: Dorsal, Left Cleanser Normal Saline Discharge Instruction: Wash your hands with soap and water. Remove old dressing, discard into plastic bag and place into trash. Cleanse the wound with Normal Saline prior to applying a clean dressing using gauze sponges, not tissues or cotton balls. Do not Porto, Merie L. ([id number]) scrub or use excessive force. Pat dry using gauze sponges, not tissue or cotton balls. Peri-Wound Care Topical Primary Dressing Silvercel Small 2x2 (in/in) Discharge Instruction: Apply Silvercel Small 2x2 (in/in) as instructed Secondary Dressing Coverlet Latex-Free Fabric Adhesive Dressings Discharge Instruction: 1.5 x 2 Secured With Compression Wrap Compression Stockings Add-Ons Electronic Signature(s) Signed: 01/22/2021 9:13:32 AM By: Donnamarie Poag Entered ByDonnamarie Poag on 01/21/2021 13:39:24 Tabb, Jonee L. ([id number]) -------------------------------------------------------------------------------- Vitals Details Patient Name: Auzenne, Cynda L. Date of Service: 01/21/2021 1:30 PM Medical Record Number: [id number] Patient Account Number: [id number] Date of Birth/Sex: 03-21-1930 (87 y.o. F) Treating RN: Donnamarie Poag Primary Care Tyneisha Hegeman: [name] Other Clinician: Referring Refujio Haymer: [name] Treating Willetta York/Extender: Tito Dine in Treatment: 2 Vital Signs Time  Taken: 13:30 Temperature (F): 97.9 Height (in): 67 Pulse (bpm): 81 Weight (lbs): 147 Respiratory Rate (breaths/min): 18 Body Mass Index (BMI): 23 Blood Pressure (mmHg): 118/69 Reference Range: 80 - 120 mg / dl Electronic Signature(s) Signed: 01/22/2021 9:13:32 AM By: Donnamarie Poag Entered ByDonnamarie Poag on 01/21/2021 13:32:58

## 2021-01-22 NOTE — Progress Notes (Signed)
Kristin [name], [name] ([id number]) Visit Report for 01/21/2021 Debridement Details Patient Name: [name], [name] Date of Service: 01/21/2021 1:30 PM Medical Record Number: [id number] Patient Account Number: [id number] Date of Birth/Sex: September 06, 1929 (87 y.o. F) Treating RN: Cornell Barman Primary Care Provider: [name] Other Clinician: Referring Provider: [name] Treating Provider/Extender: Tito Dine in Treatment: 2 Debridement Performed for Wound #1 Left Malleolus Assessment: Performed By: Physician Ricard Dillon, MD Debridement Type: Debridement Level of Consciousness (Pre- Awake and Alert procedure): Pre-procedure Verification/Time Out Yes - 14:15 Taken: Start Time: 14:15 Total Area Debrided (L x W): 1.5 (cm) x 1 (cm) = 1.5 (cm) Tissue and other material Non-Viable, Eschar, Skin: Epidermis debrided: Level: Skin/Epidermis Debridement Description: Selective/Open Wound Instrument: Curette Bleeding: None Response to Treatment: Procedure was tolerated well Level of Consciousness (Post- Awake and Alert procedure): Post Debridement Measurements of Total Wound Length: (cm) 1.5 Width: (cm) 1 Depth: (cm) 0.1 Volume: (cm) 0.118 Character of Wound/Ulcer Post Debridement: Stable Post Procedure Diagnosis Same as Pre-procedure Electronic Signature(s) Signed: 01/21/2021 5:08:05 PM By: Linton Ham MD Signed: 01/22/2021 2:40:56 PM By: Gretta Cool, BSN, RN, CWS, Kim RN, BSN Entered By: Linton Ham on 01/21/2021 14:41:13 Beretta, Diann L. ([id number]) -------------------------------------------------------------------------------- Debridement Details Patient Name: Kristin [name], [name] Date of Service: 01/21/2021 1:30 PM Medical Record Number: [id number] Patient Account Number: [id number] Date of Birth/Sex: 1929/12/18 (87 y.o. F) Treating RN: Cornell Barman Primary Care Provider: [name] Other Clinician: Referring Provider: [name] Treating Provider/Extender: Tito Dine in Treatment: 2 Debridement Performed for Wound #2 Left,Dorsal Toe Third Assessment: Performed By: Physician Ricard Dillon, MD Debridement Type: Debridement Level of Consciousness (Pre- Awake and Alert procedure): Pre-procedure Verification/Time Out Yes - 14:15 Taken: Start Time: 14:15 Total Area Debrided (L x W): 0.2 (cm) x 0.3 (cm) = 0.06 (cm) Tissue and other material Non-Viable, Callus, Skin: Epidermis debrided: Level: Skin/Epidermis Debridement Description: Selective/Open Wound Instrument: Curette Bleeding: None Response to Treatment: Procedure was tolerated well Level of Consciousness (Post- Awake and Alert procedure): Post Debridement Measurements of Total Wound Length: (cm) 0.2 Width: (cm) 0.3 Depth: (cm) 0.1 Volume: (cm) 0.005 Character of Wound/Ulcer Post Debridement: Stable Post Procedure Diagnosis Same as Pre-procedure Electronic Signature(s) Signed: 01/21/2021 5:08:05 PM By: Linton Ham MD Signed: 01/22/2021 2:40:56 PM By: Gretta Cool, BSN, RN, CWS, Kim RN, BSN Entered By: Linton Ham on 01/21/2021 14:41:24 Billard, Zoraida L. ([id number]) -------------------------------------------------------------------------------- HPI Details Patient Name: Kristin [name], [name] Date of Service: 01/21/2021 1:30 PM Medical Record Number: [id number] Patient Account Number: [id number] Date of Birth/Sex: Apr 22, 1930 (87 y.o. F) Treating RN: Cornell Barman Primary Care Provider: [name] Other Clinician: Referring Provider: [name] Treating Provider/Extender: Tito Dine in Treatment: 2 History of Present Illness HPI Description: Left 0.81 Right 0.83 12/25/20 About a year but patient is not sure how long for sure 01/06/2021 upon evaluation today patient presents for initial inspection here in our clinic concerning issues she has been having in 2 areas one being on her left lateral  malleolus. This is the most significant of the 2 regions based on what I am seeing today. The other is actually over her left dorsal 3rd toe. With that being said the patient tells me that currently she has been tolerating the dressing changes without complication. There does not appear to be any signs of obvious infection at this time but nonetheless she does not seem to be doing a lot better. She is mainly just been putting a cream on the areas but she is  not sure what cream this was when I questioned her about that today she states "it may be on my med list" although I did not see that. The patient is really not a very good historian in general concerning how long this has been going on and what exactly is going on. The patient does have a history of neuropathy, chronic kidney disease, atrial fibrillation and is on long-term anticoagulant therapy as well as aspirin. This includes Eliquis. 5/24; patient has an open area on the left lateral malleolus as well as an area on the base of her third toe which is mycotic also on the left. We have been using Hydrofera Blue as of last week. Biopsy that was done showed chronic stasis dermatitis with areas of healing erosion and overlying inflammatory crust. Negative for malignancy and negative for fungal organisms. X-ray of the foot did not show anything abnormal except degenerative changes 6/1; left lateral malleolus wound had a lot of callus and dry skin on the surface. I remove this there is only 2 small open areas. The base of the left third toe at the base of the mycotic nail also required debridement we have been using silver alginate in both areas Electronic Signature(s) Signed: 01/21/2021 5:08:05 PM By: Linton Ham MD Entered By: Linton Ham on 01/21/2021 14:41:59 Grinnell, Onna L. ([id number]) -------------------------------------------------------------------------------- Physical Exam Details Patient Name: Kristin [name], [name] Date of Service:  01/21/2021 1:30 PM Medical Record Number: [id number] Patient Account Number: [id number] Date of Birth/Sex: 10/31/1929 (87 y.o. F) Treating RN: Cornell Barman Primary Care Provider: [name] Other Clinician: Referring Provider: [name] Treating Provider/Extender: Tito Dine in Treatment: 2 Constitutional Sitting or standing Blood Pressure is within target range for patient.. Pulse regular and within target range for patient.Marland Kitchen Respirations regular, non- labored and within target range.. Temperature is normal and within the target range for the patient.Marland Kitchen appears in no distress. Notes Wound exam; left lateral malleolus. Dry flaking skin and eschar on the surface of this which I removed with a #3 curette only 2 small open areas this is just about closed o At the base of the left third toenail which is mycotic there is still debris on the surface. This could be a chronic paronychia a but it is not really an open wound Electronic Signature(s) Signed: 01/21/2021 5:08:05 PM By: Linton Ham MD Entered By: Linton Ham on 01/21/2021 14:42:54 Kite, Kyani L. ([id number]) -------------------------------------------------------------------------------- Physician Orders Details Patient Name: Kristin [name], [name] Date of Service: 01/21/2021 1:30 PM Medical Record Number: [id number] Patient Account Number: [id number] Date of Birth/Sex: 10-06-1929 (87 y.o. F) Treating RN: Dolan Amen Primary Care Provider: [name] Other Clinician: Referring Provider: [name] Treating Provider/Extender: Tito Dine in Treatment: 2 Verbal / Phone Orders: No Diagnosis Coding Follow-up Appointments o Return Appointment in 1 week. Bathing/ Shower/ Hygiene o May shower with wound dressing protected with water repellent cover or cast protector. o No tub bath. Edema Control - Lymphedema / Segmental Compressive Device / Other Left Lower  Extremity o Optional: One layer of unna paste to top of compression wrap (to act as an anchor). o Elevate, Exercise Daily and Avoid Standing for Long Periods of Time. o Elevate legs to the level of the heart and pump ankles as often as possible o Elevate leg(s) parallel to the floor when sitting. Wound Treatment Wound #1 - Malleolus Wound Laterality: Left Cleanser: Normal Saline 1 x Per Week/30 Days Discharge Instructions: Wash your hands with soap and water. Remove  old dressing, discard into plastic bag and place into trash. Cleanse the wound with Normal Saline prior to applying a clean dressing using gauze sponges, not tissues or cotton balls. Do not scrub or use excessive force. Pat dry using gauze sponges, not tissue or cotton balls. Primary Dressing: Silvercel Small 2x2 (in/in) 1 x Per Week/30 Days Discharge Instructions: Apply Silvercel Small 2x2 (in/in) as instructed Secondary Dressing: ABD Pad 5x9 (in/in) 1 x Per Week/30 Days Discharge Instructions: Cover with ABD pad Compression Wrap: Profore Lite LF 3 Multilayer Compression Bandaging System (Generic) 1 x Per Week/30 Days Discharge Instructions: Apply 3 multi-layer wrap as prescribed. Wound #2 - Toe Third Wound Laterality: Dorsal, Left Cleanser: Normal Saline 3 x Per Week/30 Days Discharge Instructions: Wash your hands with soap and water. Remove old dressing, discard into plastic bag and place into trash. Cleanse the wound with Normal Saline prior to applying a clean dressing using gauze sponges, not tissues or cotton balls. Do not scrub or use excessive force. Pat dry using gauze sponges, not tissue or cotton balls. Primary Dressing: Silvercel Small 2x2 (in/in) 3 x Per Week/30 Days Discharge Instructions: Apply Silvercel Small 2x2 (in/in) as instructed Secondary Dressing: Coverlet Latex-Free Fabric Adhesive Dressings 3 x Per Week/30 Days Discharge Instructions: 1.5 x 2 Electronic Signature(s) Signed: 01/21/2021 4:52:16 PM  By: Georges Mouse, Minus Breeding RN Signed: 01/21/2021 5:08:05 PM By: Linton Ham MD Entered By: Georges Mouse, Minus Breeding on 01/21/2021 14:21:13 Elden, Lajoya L. ([id number]) -------------------------------------------------------------------------------- Problem List Details Patient Name: Kristin [name], [name] Date of Service: 01/21/2021 1:30 PM Medical Record Number: [id number] Patient Account Number: [id number] Date of Birth/Sex: 08-08-1930 (87 y.o. F) Treating RN: Cornell Barman Primary Care Provider: [name] Other Clinician: Referring Provider: [name] Treating Provider/Extender: Tito Dine in Treatment: 2 Active Problems ICD-10 Encounter Code Description Active Date MDM Diagnosis G90.09 Other idiopathic peripheral autonomic neuropathy 01/06/2021 No Yes L97.322 Non-pressure chronic ulcer of left ankle with fat layer exposed 01/06/2021 No Yes L97.522 Non-pressure chronic ulcer of other part of left foot with fat layer 01/06/2021 No Yes exposed N18.9 Chronic kidney disease, unspecified 01/06/2021 No Yes I48.0 Paroxysmal atrial fibrillation 01/06/2021 No Yes Z79.01 Long term (current) use of anticoagulants 01/06/2021 No Yes Inactive Problems Resolved Problems Electronic Signature(s) Signed: 01/21/2021 5:08:05 PM By: Linton Ham MD Entered By: Linton Ham on 01/21/2021 14:39:42 Mainer, Mccall L. ([id number]) -------------------------------------------------------------------------------- Progress Note Details Patient Name: Kristin [name], [name] Date of Service: 01/21/2021 1:30 PM Medical Record Number: [id number] Patient Account Number: [id number] Date of Birth/Sex: 03-06-1930 (87 y.o. F) Treating RN: Cornell Barman Primary Care Provider: [name] Other Clinician: Referring Provider: [name] Treating Provider/Extender: Tito Dine in Treatment: 2 Subjective History of Present Illness (HPI) Left 0.81 Right 0.83 12/25/20 About a  year but patient is not sure how long for sure 01/06/2021 upon evaluation today patient presents for initial inspection here in our clinic concerning issues she has been having in 2 areas one being on her left lateral malleolus. This is the most significant of the 2 regions based on what I am seeing today. The other is actually over her left dorsal 3rd toe. With that being said the patient tells me that currently she has been tolerating the dressing changes without complication. There does not appear to be any signs of obvious infection at this time but nonetheless she does not seem to be doing a lot better. She is mainly just been putting a cream on the areas but she is not sure what  cream this was when I questioned her about that today she states "it may be on my med list" although I did not see that. The patient is really not a very good historian in general concerning how long this has been going on and what exactly is going on. The patient does have a history of neuropathy, chronic kidney disease, atrial fibrillation and is on long-term anticoagulant therapy as well as aspirin. This includes Eliquis. 5/24; patient has an open area on the left lateral malleolus as well as an area on the base of her third toe which is mycotic also on the left. We have been using Hydrofera Blue as of last week. Biopsy that was done showed chronic stasis dermatitis with areas of healing erosion and overlying inflammatory crust. Negative for malignancy and negative for fungal organisms. X-ray of the foot did not show anything abnormal except degenerative changes 6/1; left lateral malleolus wound had a lot of callus and dry skin on the surface. I remove this there is only 2 small open areas. The base of the left third toe at the base of the mycotic nail also required debridement we have been using silver alginate in both areas Objective Constitutional Sitting or standing Blood Pressure is within target range for  patient.. Pulse regular and within target range for patient.Marland Kitchen Respirations regular, non- labored and within target range.. Temperature is normal and within the target range for the patient.Marland Kitchen appears in no distress. Vitals Time Taken: 1:30 PM, Height: 67 in, Weight: 147 lbs, BMI: 23, Temperature: 97.9 F, Pulse: 81 bpm, Respiratory Rate: 18 breaths/min, Blood Pressure: 118/69 mmHg. General Notes: Wound exam; left lateral malleolus. Dry flaking skin and eschar on the surface of this which I removed with a #3 curette only 2 small open areas this is just about closed At the base of the left third toenail which is mycotic there is still debris on the surface. This could be a chronic paronychia a but it is not really an open wound Integumentary (Hair, Skin) Wound #1 status is Open. Original cause of wound was Gradually Appeared. The date acquired was: 09/23/2020. The wound has been in treatment 2 weeks. The wound is located on the Left Malleolus. The wound measures 1.5cm length x 1cm width x 0.1cm depth; 1.178cm^2 area and 0.118cm^3 volume. There is Fat Layer (Subcutaneous Tissue) exposed. There is no tunneling or undermining noted. There is a medium amount of serosanguineous drainage noted. There is no granulation within the wound bed. There is a large (67-100%) amount of necrotic tissue within the wound bed including Eschar. Wound #2 status is Open. Original cause of wound was Gradually Appeared. The date acquired was: 04/23/2020. The wound has been in treatment 2 weeks. The wound is located on the Left,Dorsal Toe Third. The wound measures 0.2cm length x 0.3cm width x 0.1cm depth; 0.047cm^2 area and 0.005cm^3 volume. There is Fat Layer (Subcutaneous Tissue) exposed. There is no tunneling or undermining noted. There is a small amount of serosanguineous drainage noted. There is medium (34-66%) pink granulation within the wound bed. There is a medium (34-66%) amount of necrotic tissue within the wound bed  including Eschar and Adherent [place]. [place]ing, Brighton L. 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 There was a Selective/Open Wound Skin/Epidermis Debridement with a total area of 1.5 sq cm performed by Ricard Dillon, MD. With the following instrument(s): Curette to remove Non-Viable tissue/material. Material removed includes Eschar and Skin: Epidermis and. A time out was conducted at 14:15, prior to the start of the procedure. There was no bleeding. The procedure was tolerated well. Post Debridement Measurements: 1.5cm length x 1cm width x 0.1cm depth; 0.118cm^3 volume. Character of Wound/Ulcer Post Debridement is stable. Post procedure Diagnosis Wound #1: Same as Pre-Procedure Wound #2 Pre-procedure diagnosis of Wound #2 is a Neuropathic Ulcer-Non Diabetic located on the Left,Dorsal Toe Third . There was a Selective/Open Wound Skin/Epidermis Debridement with a total area of 0.06 sq cm performed by Ricard Dillon, MD. With the following instrument(s): Curette to remove Non-Viable tissue/material. Material removed includes Callus and Skin: Epidermis and. A time out was conducted at 14:15, prior to the start of the procedure. There was no bleeding. The procedure was tolerated well. Post Debridement Measurements: 0.2cm length x 0.3cm width x 0.1cm depth; 0.005cm^3 volume. Character of Wound/Ulcer Post Debridement is stable. Post procedure Diagnosis Wound #2: Same as Pre-Procedure Plan Follow-up Appointments: Return Appointment in 1 week. Bathing/ Shower/  Hygiene: May shower with wound dressing protected with water repellent cover or cast protector. No tub bath. Edema Control - Lymphedema / Segmental Compressive Device / Other: Optional: One layer of unna paste to top of compression wrap (to act as an anchor). Elevate, Exercise Daily and Avoid Standing for Long Periods of Time. Elevate legs to the level of the heart and pump ankles as often as possible Elevate leg(s) parallel to the floor when sitting. WOUND #1: - Malleolus Wound Laterality: Left Cleanser: Normal Saline 1 x Per Week/30 Days Discharge Instructions: Wash your hands with soap and water. Remove old dressing, discard into plastic bag and place into trash. Cleanse the wound with Normal Saline prior to applying a clean dressing using gauze sponges, not tissues or cotton balls. Do not scrub or use excessive force. Pat dry using gauze sponges, not tissue or cotton balls. Primary Dressing: Silvercel Small 2x2 (in/in) 1 x Per Week/30 Days Discharge Instructions: Apply Silvercel Small 2x2 (in/in) as instructed Secondary Dressing: ABD Pad 5x9 (in/in) 1 x Per Week/30 Days Discharge Instructions: Cover with ABD pad Compression Wrap: Profore Lite LF 3 Multilayer Compression Bandaging System (Generic) 1 x Per Week/30 Days Discharge Instructions: Apply 3 multi-layer wrap as prescribed. WOUND #2: - Toe Third Wound Laterality: Dorsal, Left Cleanser: Normal Saline 3 x Per Week/30 Days Discharge Instructions: Wash your hands with soap and water. Remove old dressing, discard into plastic bag and place into trash. Cleanse the wound with Normal Saline prior to applying a clean dressing using gauze sponges, not tissues or cotton balls. Do not scrub or use excessive force. Pat dry using gauze sponges, not tissue or cotton balls. Primary Dressing: Silvercel Small 2x2 (in/in) 3 x Per Week/30 Days Discharge Instructions: Apply Silvercel Small 2x2 (in/in) as instructed Secondary Dressing: Coverlet  Latex-Free Fabric Adhesive Dressings 3 x Per Week/30 Days Discharge Instructions: 1.5 x 2 Kapral, Sherriann L. ([id number]) 1. I am continuing with silver alginate I think the left lateral malleolus will be healed by next week Electronic Signature(s) Signed: 01/21/2021 5:08:05 PM By: Linton Ham MD Entered By: Linton Ham on 01/21/2021 14:43:11 Puskarich, Mendi L. ([id number]) -------------------------------------------------------------------------------- SuperBill Details Patient Name: Kristin [name], [name] Date of Service: 01/21/2021 Medical Record  Number: [id number] Patient Account Number: [id number] Date of Birth/Sex: 1930/05/24 (87 y.o. F) Treating RN: Cornell Barman Primary Care Provider: [name] Other Clinician: Referring Provider: [name] Treating Provider/Extender: Tito Dine in Treatment: 2 Diagnosis Coding ICD-10 Codes Code Description G90.09 Other idiopathic peripheral autonomic neuropathy L97.322 Non-pressure chronic ulcer of left ankle with fat layer exposed L97.522 Non-pressure chronic ulcer of other part of left foot with fat layer exposed N18.9 Chronic kidney disease, unspecified I48.0 Paroxysmal atrial fibrillation Z79.01 Long term (current) use of anticoagulants Facility Procedures CPT4 Code: [id number] Description: [phone] - DEBRIDE WOUND 1ST 20 SQ CM OR < Modifier: Quantity: 1 CPT4 Code: Description: ICD-10 Diagnosis Description L97.322 Non-pressure chronic ulcer of left ankle with fat layer exposed L97.522 Non-pressure chronic ulcer of other part of left foot with fat layer expo Modifier: sed Quantity: Physician Procedures CPT4 Code: [id number] Description: 97597 - WC PHYS DEBR WO ANESTH 20 SQ CM Modifier: Quantity: 1 CPT4 Code: Description: ICD-10 Diagnosis Description S96.283 Non-pressure chronic ulcer of left ankle with fat layer exposed L97.522 Non-pressure chronic ulcer of other part of left foot with fat layer expo Modifier:  sed Quantity: Electronic Signature(s) Signed: 01/21/2021 5:08:05 PM By: Linton Ham MD Entered By: Linton Ham on 01/21/2021 14:43:28

## 2021-01-27 ENCOUNTER — Encounter: Payer: Medicare HMO | Admitting: Physician Assistant

## 2021-01-27 ENCOUNTER — Other Ambulatory Visit: Payer: Self-pay

## 2021-01-27 DIAGNOSIS — L97322 Non-pressure chronic ulcer of left ankle with fat layer exposed: Secondary | ICD-10-CM | POA: Diagnosis not present

## 2021-01-27 DIAGNOSIS — G9009 Other idiopathic peripheral autonomic neuropathy: Secondary | ICD-10-CM | POA: Diagnosis not present

## 2021-01-27 DIAGNOSIS — N189 Chronic kidney disease, unspecified: Secondary | ICD-10-CM | POA: Diagnosis not present

## 2021-01-27 DIAGNOSIS — I480 Paroxysmal atrial fibrillation: Secondary | ICD-10-CM | POA: Diagnosis not present

## 2021-01-27 DIAGNOSIS — I872 Venous insufficiency (chronic) (peripheral): Secondary | ICD-10-CM | POA: Diagnosis not present

## 2021-01-27 DIAGNOSIS — Z7901 Long term (current) use of anticoagulants: Secondary | ICD-10-CM | POA: Diagnosis not present

## 2021-01-27 DIAGNOSIS — L97522 Non-pressure chronic ulcer of other part of left foot with fat layer exposed: Secondary | ICD-10-CM | POA: Diagnosis not present

## 2021-01-27 NOTE — Progress Notes (Signed)
Patient: Kristin [name]  Service Category: E/M  Provider: Gaspar Cola, MD  DOB: 1929-11-02  DOS: 01/28/2021  Location: Office  MRN: [id number]  Setting: Ambulatory outpatient  Referring Provider: [name] *  Type: Established Patient  Specialty: Interventional Pain Management  PCP: Olin Hauser, DO  Location: Remote location  Delivery: TeleHealth     Virtual Encounter - Pain Management PROVIDER NOTE: Information contained herein reflects review and annotations entered in association with encounter. Interpretation of such information and data should be left to medically-trained personnel. Information provided to patient can be located elsewhere in the medical record under "Patient Instructions". Document created using STT-dictation technology, any transcriptional errors that may result from process are unintentional.    Contact & Pharmacy Preferred: [phone] Home: [phone] (home) Mobile: There is no such number on file (mobile). E-mail: [email or web address]  [place], [place] - [address] [place] [address] Phone: [phone] Fax: [phone]   Pre-screening  Ms. Panepinto offered "in-person" vs "virtual" encounter. She indicated preferring virtual for this encounter.   Reason COVID-19*  Social distancing based on CDC and AMA recommendations.   I contacted [name] on 01/28/2021 via telephone.      I clearly identified myself as Gaspar Cola, MD. I verified that I was speaking with the correct person using two identifiers (Name: SIMRIN VEGH, and date of birth: September 17, 1929).  Consent I sought verbal advanced consent from [name] for virtual visit interactions. I informed Ms. Scioneaux of possible security and privacy concerns, risks, and limitations associated with providing "not-in-person" medical evaluation and management services. I also informed Ms. Winfree of the availability of  "in-person" appointments. Finally, I informed her that there would be a charge for the virtual visit and that she could be  personally, fully or partially, financially responsible for it. Ms. Andre expressed understanding and agreed to proceed.   Historic Elements   Ms. CALEYAH JR is a 87 y.o. year old, female patient evaluated today after our last contact on 12/25/2020. Ms. Semidey  has a past medical history of Acute postoperative pain (02/09/2018), Arrhythmia, sinus node (04/03/2014), Arthritis, Arthritis, Atrial fibrillation ([place]), Back pain, Bradycardia, Breast cancer ([place]) (2004), Cancer ([place]) (2004), CHF (congestive heart failure) ([place]), Chronic back pain, CKD (chronic kidney disease), COPD (chronic obstructive pulmonary disease) ([place]), Cystocele, Decubitus ulcers, Dehydration, Gout, Hematuria, Hepatitis C, Hyperlipidemia, Hypertension, Hypomagnesemia (06/25/2015), OAB (overactive bladder), Overactive bladder, Restless leg, Shoulder pain, bilateral, Urinary frequency, UTI (lower urinary tract infection), and Vaginal atrophy. She also  has a past surgical history that includes Abdominal hysterectomy; Back surgery; Rotator cuff repair; Breast surgery; Cataract extraction w/PHACO (10/19/2011); Cataract extraction w/PHACO (11/02/2011); Carpal tunnel release; Joint replacement; Colon surgery; Pacemaker insertion (N/A, 12/24/2015); Cholecystectomy; Breast lumpectomy (Right, 2004); Insert / replace / remove pacemaker; and Lower Extremity Angiography (Left, 10/14/2020). Ms. Whittington has a current medication list which includes the following prescription(s): acetaminophen, albuterol, apixaban, aspirin ec, atorvastatin, bisacodyl, vitamin d, dextromethorphan-guaifenesin, furosemide, levocetirizine, lidocaine, lisinopril, melatonin, oxygen-helium, ropinirole, solifenacin, stiolto respimat, anoro ellipta, oxycodone hcl, and benefiber. She  reports that she quit smoking about 22 years ago. Her smoking use included cigarettes.  She has a 50.00 pack-year smoking history. She has quit using smokeless tobacco. She reports current drug use. Drug: Oxycodone. She reports that she does not drink alcohol. Ms. Weger is allergic to flexeril [cyclobenzaprine hcl] and vancomycin.   HPI  Today, she is being contacted for medication management.  The  patient indicates doing well with the current medication regimen. No adverse reactions or side effects reported to the medications.   The patient indicates not currently taking gabapentin (Neurontin), diclofenac gel (Voltaren), with the MiraLAX.  Last UDS done on 11/24/2020 was abnormal showing an unreported buprenorphine.  Since then, we involved the family in her medication management and care.  It turns out that another family member was apparently sharing his controlled substances with her, which is likely to be responsible for the episodes that she experienced of cognitive impairment and disorientation.  Appropriate warnings were provided to the patient again, on several occasions.  RTCB: 02/27/2021 Nonopioids transferred 06/04/2020: Requip, MiraLAX, Dulcolax, and melatonin  Pharmacotherapy Assessment  Analgesic: Oxycodone IR 10 mg, 1 tab PO BID (20 mg/day of oxycodone) MME/day: 30 mg/day.   Monitoring: [place] PMP: PDMP reviewed during this encounter.       Pharmacotherapy: No side-effects or adverse reactions reported. Compliance: No problems identified. Effectiveness: Clinically acceptable. Plan: Refer to "POC".  UDS:  Summary  Date Value Ref Range Status  11/24/2020 Note  Final    Comment:    ==================================================================== Tox[employer] Select 13 (MW) ==================================================================== Test                             Result       Flag       Units  Drug Present not Declared for Prescription Verification   Buprenorphine                  180          UNEXPECTED ng/mg creat   Norbuprenorphine               108           UNEXPECTED ng/mg creat    Source of buprenorphine is a scheduled prescription medication.    Norbuprenorphine is an expected metabolite of buprenorphine.  Drug Absent but Declared for Prescription Verification   Oxycodone                      Not Detected UNEXPECTED ng/mg creat ==================================================================== Test                      Result    Flag   Units      Ref Range   Creatinine              120              mg/dL      >=20 ==================================================================== Declared Medications:  The flagging and interpretation on this report are based on the  following declared medications.  Unexpected results may arise from  inaccuracies in the declared medications.   **Note: The testing scope of this panel includes these medications:   Oxycodone   **Note: The testing scope of this panel does not include the  following reported medications:   Acetaminophen  Albuterol  Apixaban  Aspirin  Atorvastatin  Bisacodyl  Dextromethorphan (Mucinex DM)  Furosemide  Guaifenesin (Mucinex DM)  Helium  Levocetirizine  Melatonin  Oxygen  Polyethylene Glycol  Ropinirole  Solifenacin  Supplement  Topical  Topical Diclofenac  Vitamin D ==================================================================== For clinical consultation, please call [phone]. ====================================================================     Laboratory Chemistry Profile   Renal Lab Results  Component Value Date   BUN 31 (H) 11/07/2020   CREATININE 1.43 (H) 11/07/2020   BCR 29 (H)  10/02/2020   GFRAA 52 (L) 10/02/2020   GFRNONAA 35 (L) 11/07/2020     Hepatic Lab Results  Component Value Date   AST 21 11/07/2020   ALT 16 11/07/2020   ALBUMIN 3.7 11/07/2020   ALKPHOS 55 11/07/2020     Electrolytes Lab Results  Component Value Date   NA 134 (L) 11/07/2020   K 4.2 11/07/2020   CL 108 11/07/2020   CALCIUM 9.0  11/07/2020   MG 2.0 11/07/2020   PHOS 3.0 08/05/2020     Bone Lab Results  Component Value Date   VD25OH 31 04/01/2020     Inflammation (CRP: Acute Phase) (ESR: Chronic Phase) Lab Results  Component Value Date   CRP <0.5 09/24/2015   ESRSEDRATE 12 09/24/2015   LATICACIDVEN 1.8 08/02/2020       Note: Above Lab results reviewed.  Imaging  CT CHEST WO CONTRAST CLINICAL DATA:  87 year old female with history of recent pneumonia.  EXAM: CT CHEST WITHOUT CONTRAST  TECHNIQUE: Multidetector CT imaging of the chest was performed following the standard protocol without IV contrast.  COMPARISON:  Chest CT 09/07/2014.  FINDINGS: Cardiovascular: Heart size is normal. There is no significant pericardial fluid, thickening or pericardial calcification. There is aortic atherosclerosis, as well as atherosclerosis of the great vessels of the mediastinum and the coronary arteries, including calcified atherosclerotic plaque in the left main, left anterior descending, left circumflex and right coronary arteries. Calcifications of the aortic valve. Left-sided pacemaker device in place with lead tips terminating in the right atrium and right ventricular apex.  Mediastinum/Nodes: No pathologically enlarged mediastinal or hilar lymph nodes. Please note that accurate exclusion of hilar adenopathy is limited on noncontrast CT scans. Esophagus is unremarkable in appearance. No axillary lymphadenopathy.  Lungs/Pleura: Widespread areas of ground-glass attenuation, septal thickening, cylindrical and varicose bronchiectasis and extensive thickening of the peribronchovascular interstitium with regional areas of architectural distortion noted in a random distribution throughout the lungs bilaterally with a mild craniocaudal gradient. In addition, there is diffuse bronchial wall thickening with mild to moderate centrilobular and paraseptal emphysema. No acute consolidative airspace disease. No  pleural effusions. A few scattered pulmonary nodules are noted throughout the lungs bilaterally, largest of which measures 7 mm in the left upper lobe abutting the major fissure (axial image 80 of series 4), stable compared to 2016, considered benign. No other larger more suspicious appearing pulmonary nodules or masses are noted.  Upper Abdomen: Aortic atherosclerosis. Status post cholecystectomy. 1.7 cm low-attenuation lesion in the upper pole of the left kidney, incompletely characterized on today's non-contrast CT examination, but statistically likely to represent a cyst.  Musculoskeletal: Status post bilateral shoulder arthroplasty. There are no aggressive appearing lytic or blastic lesions noted in the visualized portions of the skeleton.  IMPRESSION: 1. No acute findings on today's examination. 2. The appearance of the lungs suggests interstitial lung disease, with a spectrum of findings considered probable usual interstitial pneumonia (UIP) per current ATS guidelines. Mild progression compared to the prior study from 2016. In addition, there is a background of diffuse bronchial wall thickening with mild to moderate centrilobular and paraseptal emphysema; imaging findings suggestive of concurrent COPD. 3. Aortic atherosclerosis, in addition to left main and 3 vessel coronary artery disease. 4. There are calcifications of the aortic valve. Echocardiographic correlation for evaluation of potential valvular dysfunction may be warranted if clinically indicated. 5. Additional incidental findings, as above.  Aortic Atherosclerosis (ICD10-I70.0) and Emphysema (ICD10-J43.9).  Electronically Signed   By: Mauri Brooklyn.D.  On: 01/09/2021 10:41  Assessment  The primary encounter diagnosis was Chronic pain syndrome. 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  Plan of Care  Problem-specific:  No problem-specific Assessment & Plan notes found for this encounter.  Ms. LEONIA HEATHERLY has a current medication list which includes the following long-term medication(s): albuterol, apixaban, atorvastatin, bisacodyl, furosemide, levocetirizine, ropinirole, oxycodone hcl, and benefiber.  Pharmacotherapy (Medications Ordered): Meds ordered this encounter  Medications  . Oxycodone HCl 10 MG TABS    Sig: Take 1 tablet (10 mg total) by mouth 2 (two) times daily as needed. Must last 30 days.    Dispense:  60 tablet    Refill:  0    Not a duplicate. Do NOT delete! Dispense 1 day early if closed on refill date. Avoid benzodiazepines within 8 hours of opioids. Do not send refill requests.   Orders:  Orders Placed This Encounter  Procedures  . ToxASSURE Select 13 (MW), Urine    Volume: 30 ml(s). Minimum 3 ml of urine is needed. Document temperature of fresh sample. Indications: Long term (current) use of opiate analgesic (U93.235)    Order Specific Question:   Release to patient    Answer:   Immediate   Follow-up plan:   Return in about 1 month (around 02/27/2021) for evaluation day (F2F) (MM).      Interventional Therapies  Risk  Complexity Considerations:   NOTE: ELIQUIS ANTICOAGULATION (Stop: x3 days prior to procedure  Restart 6 hours after procedures)   Planned  Pending:   Pending further evaluation   Under consideration:   Diagnostic right SI joint block Possible bilateral SI joint RFA Diagnostic bilateral femoral nerve + obturator  NB Possible bilateral femoral nerve + obturator nerve RFA Diagnostic right IA shoulder joint injection   Completed:   Diagnostic/therapeutic right L2 TFESI x1 (01/31/2020)  Diagnostic/therapeutic left L4 TFESI x1 (01/31/2020) Diagnostic left SI joint block x2 (09/27/2019) Palliative left CESI x2 (10/13/2017) Palliative left trochanteric bursa injection x1 (03/27/2019)  Palliative right IA hip inj. x1  (100% on the right) (02/08/2019) Palliative left IA hip inj. x2 (02/08/2019) Palliative right lumbar facet MBB x2(01/10/2020) Palliative left lumbar facet MBB x4(01/10/2020) Palliative right lumbar facet RFA x1 (09/19/18) Palliative left lumbar facet RFA x3 (08/01/18) Diagnostic/therapeutic right suprascapular NB x3 (05/10/2017)  Palliative bilateral suprascapular nerve RFA x1(07/13/18)   Therapeutic  Palliative (PRN) options:   Diagnostic left SI joint block #2  Palliative left CESI #3  Palliative left trochanteric bursa injection #2  Palliative right IA hip inj. #2  (100% on the right) Palliative left IA hip inj. #3   Palliative right lumbar facet block #3 Palliative left lumbar facet block #5 Palliative right lumbar facet RFA #2  Palliative left lumbar facet RFA #4  Palliative bilateral suprascapular nerve block Palliative bilateral suprascapular nerve RFA #2       Recent Visits Date Type Provider Dept  12/29/20 Telemedicine Milinda Pointer, [facility]  11/24/20 Office Visit Milinda Pointer, MD [facility]  Showing recent visits within past 90 days and meeting all  other requirements Today's Visits Date Type Provider Dept  01/28/21 Telemedicine Milinda Pointer, MD [facility]  Showing today's visits and meeting all other requirements Future Appointments No visits were found meeting these conditions. Showing future appointments within next 90 days and meeting all other requirements  I discussed the assessment and treatment plan  with the patient. The patient was provided an opportunity to ask questions and all were answered. The patient agreed with the plan and demonstrated an understanding of the instructions.  Patient advised to call back or seek an in-person evaluation if the symptoms or condition worsens.  Duration of encounter: 12 minutes.  Note by: Gaspar Cola, MD Date: 01/28/2021; Time: 9:06 AM

## 2021-01-27 NOTE — Progress Notes (Signed)
[name], [name] ([id number]) Visit Report for 01/27/2021 Arrival Information Details Patient Name: [name], [name] Date of Service: 01/27/2021 11:30 AM Medical Record Number: [id number] Patient Account Number: [id number] Date of Birth/Sex: 24-Apr-1930 (87 y.o. F) Treating [name]: Kristin [name] Primary Care Kristin [name] Other Clinician: Referring Kristin [name] Kristin [name] in Treatment: 3 Visit Information History Since Last Visit All ordered tests and consults were completed: No Patient Arrived: Kristin [name] Added or deleted any medications: No Arrival Time: 11:28 Any new allergies or adverse reactions: No Accompanied By: self Had a fall or experienced change in No Transfer Assistance: None activities of daily living that may affect Patient Identification Verified: Yes risk of falls: Secondary Verification Process Completed: Yes Signs or symptoms of abuse/neglect since last visito No Patient Requires Transmission-Based No Hospitalized since last visit: No Precautions: Implantable device outside of the clinic excluding No Patient Has Alerts: Yes cellular tissue based products placed in the center Patient Alerts: Patient on Blood since last visit: Thinner Has Dressing in Place as Prescribed: Yes ELIQUIS and ASPIRIN Has Compression in Place as Prescribed: Yes NOT diabetic ABI left 0.81 @vascular  Pain Present Now: No ABI right 0.83 @vascular  Electronic Signature(s) Signed: 01/27/2021 4:33:14 PM By: Kristin Coria [name] Entered By: Kristin [name] on 01/27/2021 11:32:01 [name], [name] ([id number]) -------------------------------------------------------------------------------- Clinic Level of Care Assessment Details Patient Name: [name], [name] Date of Service: 01/27/2021 11:30 AM Medical Record Number: [id number] Patient Account Number: [id number] Date of Birth/Sex: 1929-12-16 (87 y.o. F) Treating [name]: Kristin [name] Primary Care  Yarnell Arvidson: Nobie [name] Other Clinician: Referring Sherrian Nunnelley: Nobie [name] Kristin Joselin Crandell/Extender: Kristin [name] in Treatment: 3 Clinic Level of Care Assessment Items TOOL 4 Quantity Score X - Use when only an EandM is performed on FOLLOW-UP visit 1 0 ASSESSMENTS - Nursing Assessment / Reassessment X - Reassessment of Co-morbidities (includes updates in patient status) 1 10 X- 1 5 Reassessment of Adherence to Treatment Plan ASSESSMENTS - Wound and Skin Assessment / Reassessment []  - Simple Wound Assessment / Reassessment - one wound 0 []  - 0 Complex Wound Assessment / Reassessment - multiple wounds []  - 0 Dermatologic / Skin Assessment (not related to wound area) ASSESSMENTS - Focused Assessment []  - Circumferential Edema Measurements - multi extremities 0 []  - 0 Nutritional Assessment / Counseling / Intervention []  - 0 Lower Extremity Assessment (monofilament, tuning fork, pulses) []  - 0 Peripheral Arterial Disease Assessment (using hand held doppler) ASSESSMENTS - Ostomy and/or Continence Assessment and Care []  - Incontinence Assessment and Management 0 []  - 0 Ostomy Care Assessment and Management (repouching, etc.) PROCESS - Coordination of Care X - Simple Patient / Family Education for ongoing care 1 15 []  - 0 Complex (extensive) Patient / Family Education for ongoing care []  - 0 Staff obtains Programmer, systems, Records, Test Results / Process Orders []  - 0 Staff telephones HHA, Nursing Homes / Clarify orders / etc []  - 0 Routine Transfer to another Facility (non-emergent condition) []  - 0 Routine Hospital Admission (non-emergent condition) []  - 0 New Admissions / Biomedical engineer / Ordering NPWT, Apligraf, etc. []  - 0 Emergency Hospital Admission (emergent condition) X- 1 10 Simple Discharge Coordination []  - 0 Complex (extensive) Discharge Coordination PROCESS - Special Needs []  - Pediatric / Minor Patient Management 0 []  -  0 Isolation Patient Management []  - 0 Hearing / Language / Visual special needs []  - 0 Assessment of Community assistance (transportation, D/C planning, etc.) []  - 0 Additional assistance / Altered mentation []  -  0 Support Surface(s) Assessment (bed, cushion, seat, etc.) INTERVENTIONS - Wound Cleansing / Measurement [name], [name] ([id number]) []  - 0 Simple Wound Cleansing - one wound []  - 0 Complex Wound Cleansing - multiple wounds []  - 0 Wound Imaging (photographs - any number of wounds) []  - 0 Wound Tracing (instead of photographs) []  - 0 Simple Wound Measurement - one wound []  - 0 Complex Wound Measurement - multiple wounds INTERVENTIONS - Wound Dressings []  - Small Wound Dressing one or multiple wounds 0 []  - 0 Medium Wound Dressing one or multiple wounds []  - 0 Large Wound Dressing one or multiple wounds []  - 0 Application of Medications - topical []  - 0 Application of Medications - injection INTERVENTIONS - Miscellaneous []  - External ear exam 0 []  - 0 Specimen Collection (cultures, biopsies, blood, body fluids, etc.) []  - 0 Specimen(s) / Culture(s) sent or taken to Lab for analysis []  - 0 Patient Transfer (multiple staff / Civil Service fast streamer / Similar devices) []  - 0 Simple Staple / Suture removal (25 or less) []  - 0 Complex Staple / Suture removal (26 or more) []  - 0 Hypo / Hyperglycemic Management (close monitor of Blood Glucose) []  - 0 Ankle / Brachial Index (ABI) - do not check if billed separately X- 1 5 Vital Signs Has the patient been seen at the hospital within the last three years: Yes Total Score: 45 Level Of Care: New/Established - Level 2 Electronic Signature(s) Signed: 01/27/2021 4:19:01 PM By: Kristin [name] Entered By: Kristin Mouse, Minus Breeding on 01/27/2021 11:59:06 [name], [name] ([id number]) -------------------------------------------------------------------------------- Lower Extremity Assessment Details Patient Name: [name], [name] Date of Service: 01/27/2021 11:30 AM Medical Record Number: [id number] Patient Account Number: [id number] Date of Birth/Sex: 12-09-1929 (87 y.o. F) Treating [name]: Kristin [name] Primary Care Tyria Springer: Nobie [name] Other Clinician: Referring Advit Trethewey: Nobie [name] Kristin Jaelynn Currier/Extender: Kristin [name] in Treatment: 3 Edema Assessment Assessed: [Left: No] [Right: No] Edema: [Left: N] [Right: o] Calf Left: Right: Point of Measurement: 33 cm From Medial Instep 31 cm Ankle Left: Right: Point of Measurement: 9 cm From Medial Instep 20 cm Vascular Assessment Pulses: Dorsalis Pedis Palpable: [Left:Yes] Electronic Signature(s) Signed: 01/27/2021 4:33:14 PM By: Kristin Coria [name] Entered By: Kristin [name] on 01/27/2021 11:40:19 Dziuba, Valicia L. ([id number]) -------------------------------------------------------------------------------- Multi Wound Chart Details Patient Name: [name], [name] Date of Service: 01/27/2021 11:30 AM Medical Record Number: [id number] Patient Account Number: [id number] Date of Birth/Sex: Dec 31, 1929 (87 y.o. 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 Comorbid History: Cataracts, Chronic Obstructive Cataracts, Chronic Obstructive N/A Pulmonary Disease (COPD), Pulmonary Disease (COPD), Arrhythmia, Congestive Heart Arrhythmia, Congestive Heart Failure, Coronary Artery Disease, Failure, Coronary Artery  Disease, Hypertension, Hepatitis C, History Hypertension, Hepatitis C, History of pressure wounds, Gout, of pressure wounds, Gout, Osteoarthritis, Received Osteoarthritis, Received Chemotherapy, Received Radiation Chemotherapy, Received Radiation Date Acquired: 09/23/2020 04/23/2020 N/A Weeks of Treatment: 3 3 N/A Wound Status: Healed - Epithelialized Healed - Epithelialized N/A Measurements L x W x D (cm) 0x0x0 0x0x0 N/A Area (cm) : 0 0 N/A Volume (cm) : 0 0 N/A % Reduction in Area: 100.00% 100.00% N/A % Reduction in  Volume: 100.00% 100.00% N/A Classification: Full Thickness Without Exposed Full Thickness Without Exposed N/A Support Structures Support Structures Exudate Amount: None Present None Present N/A Granulation Amount: None Present (0%) None Present (0%) N/A Necrotic Amount: None Present (0%) None Present (0%) N/A Exposed Structures: Fascia: No Fascia: No N/A Fat Layer (Subcutaneous Tissue): Fat Layer (Subcutaneous Tissue): No No Tendon: No Tendon: No Muscle: No Muscle: No Joint: No Joint: No Bone: No Bone: No Epithelialization: Large (67-100%) Large (67-100%) N/A Treatment Notes Electronic Signature(s) Signed: 01/27/2021 4:19:01 PM By: Kristin [name] Entered By: Kristin Mouse, Minus Breeding on 01/27/2021 11:57:34 Rosene, Mariska L. ([id number]) -------------------------------------------------------------------------------- [place] Details Patient Name: [name], [name] Date of Service: 01/27/2021 11:30 AM Medical Record Number: [id number] Patient Account Number: [id number] Date of Birth/Sex: 11/05/1929 (87 y.o. F) Treating [name]: Kristin [name] Primary Care Kitt Ledet: Nobie [name] Other Clinician: Referring Bailei Buist: Nobie [name] Kristin Alayshia Marini/Extender: Kristin [name] in Treatment: 3 Active Inactive Electronic Signature(s) Signed: 01/27/2021 4:19:01 PM By: Kristin [name] Entered By: Kristin Mouse, Minus Breeding on  01/27/2021 11:57:26 Marrocco, Shaconda L. ([id number]) -------------------------------------------------------------------------------- Pain Assessment Details Patient Name: [name], [name] Date of Service: 01/27/2021 11:30 AM Medical Record Number: [id number] Patient Account Number: [id number] Date of Birth/Sex: 10-06-1929 (87 y.o. F) Treating [name]: Kristin [name] Primary Care Catheryne Deford: Nobie [name] Other Clinician: Referring Yilia Sacca: Nobie [name] Kristin Elvenia Godden/Extender: Kristin [name] in Treatment: 3 Active Problems Location of Pain Severity and Description of Pain Patient Has Paino No Site Locations Pain Management and Medication Current Pain Management: Electronic Signature(s) Signed: 01/27/2021 4:33:14 PM By: Kristin Coria [name] Entered By: Kristin [name] on 01/27/2021 11:32:24 [name], [name] ([id number]) -------------------------------------------------------------------------------- Patient/Caregiver Education Details Patient Name: [name], [name] Date of Service: 01/27/2021 11:30 AM Medical Record Number: [id number] Patient Account Number: [id number] Date of Birth/Gender: 1930/05/26 (87 y.o. F) Treating [name]: Kristin [name] Primary Care Physician: Nobie [name] Other Clinician: Referring Physician: Nobie [name] Kristin Physician/Extender: Kristin [name] in Treatment: 3 Education Assessment Education Provided To: Patient Education Topics Provided Notes discharge instructions Electronic Signature(s) Signed: 01/27/2021 4:19:01 PM By: Kristin [name] Entered By: Kristin Mouse, Minus Breeding on 01/27/2021 11:59:24 [name], [name] ([id number]) -------------------------------------------------------------------------------- Wound Assessment Details Patient Name: [name], [name] Date of Service: 01/27/2021 11:30 AM Medical Record Number: [id number] Patient Account Number: [id number] Date of Birth/Sex: October 20, 1929 (87 y.o. F) Treating [name]: Kristin [name] Primary Care Demya Scruggs: Nobie [name] Other Clinician: Referring Blondine Hottel: Nobie [name] Kristin Rapheal Masso/Extender: Kristin [name] in Treatment: 3 Wound Status Wound Number: 1 Primary Neuropathic Ulcer-Non Diabetic Etiology: Wound Location: Left Malleolus Wound Healed - Epithelialized Wounding Event: Gradually Appeared Status: Date Acquired: 09/23/2020 Comorbid Cataracts, Chronic Obstructive Pulmonary Disease (COPD), Weeks Of Treatment: 3 History: Arrhythmia, Congestive Heart Failure, Coronary Artery Clustered Wound: No Disease, Hypertension, Hepatitis C, History of pressure wounds, Gout, Osteoarthritis, Received Chemotherapy, Received Radiation Photos Wound Measurements Length: (cm) 0 Width: (cm) 0 Depth: (cm) 0 Area: (cm) 0 Volume: (cm) 0 % Reduction in Area: 100% % Reduction in Volume: 100% Epithelialization: Large (67-100%) Tunneling: No Undermining: No Wound Description Classification: Full Thickness Without Exposed Support Structure Exudate Amount: None Present s Foul Odor After Cleansing: No Slough/Fibrino No Wound Bed Granulation Amount: None Present (0%) Exposed Structure Necrotic Amount: None Present (0%) Fascia Exposed: No Fat Layer (Subcutaneous Tissue) Exposed: No Tendon Exposed: No Muscle Exposed: No Joint Exposed: No Bone Exposed: No Electronic Signature(s) Signed: 01/27/2021 4:19:01 PM By: Charlett Nose [name] Signed: 01/27/2021 4:33:14 PM By: Kristin Coria [name] Entered By: Laurin Coder  Staci Acosta on 01/27/2021 11:56:31 [name], [name] ([id number]) -------------------------------------------------------------------------------- Wound Assessment Details Patient Name: [name], [name] Date of Service: 01/27/2021 11:30 AM Medical Record Number: [id number] Patient Account Number: [id number] Date of Birth/Sex: 07-28-1930 (87 y.o. F) Treating [name]: Kristin [name] Primary Care Nathanael Krist: Nobie [name] Other Clinician: Referring  Ha Placeres: Nobie [name] Kristin Daimien Patmon/Extender: Kristin [name] in Treatment: 3 Wound Status Wound Number: 2 Primary Neuropathic Ulcer-Non Diabetic Etiology: Wound Location: Left, Dorsal Toe Third Wound Healed - Epithelialized Wounding Event: Gradually Appeared Status: Date Acquired: 04/23/2020 Comorbid Cataracts, Chronic Obstructive Pulmonary Disease (COPD), Weeks Of Treatment: 3 History: Arrhythmia, Congestive Heart Failure, Coronary Artery Clustered Wound: No Disease, Hypertension, Hepatitis C, History of pressure wounds, Gout, Osteoarthritis, Received Chemotherapy, Received Radiation Photos Wound Measurements Length: (cm) 0 Width: (cm) 0 Depth: (cm) 0 Area: (cm) 0 Volume: (cm) 0 % Reduction in Area: 100% % Reduction in Volume: 100% Epithelialization: Large (67-100%) Tunneling: No Undermining: No Wound Description Classification: Full Thickness Without Exposed Support Structure Exudate Amount: None Present s Foul Odor After Cleansing: No Slough/Fibrino No Wound Bed Granulation Amount: None Present (0%) Exposed Structure Necrotic Amount: None Present (0%) Fascia Exposed: No Fat Layer (Subcutaneous Tissue) Exposed: No Tendon Exposed: No Muscle Exposed: No Joint Exposed: No Bone Exposed: No Electronic Signature(s) Signed: 01/27/2021 4:19:01 PM By: Charlett Nose [name] Signed: 01/27/2021 4:33:14 PM By: Kristin Coria [name] Entered By: Kristin Mouse, Minus Breeding on 01/27/2021 11:57:15 [name], [name] ([id number]) -------------------------------------------------------------------------------- Vitals Details Patient Name: Graw, Kaizlee L. Date of Service: 01/27/2021 11:30 AM Medical Record Number: [id number] Patient Account Number: [id number] Date of Birth/Sex: 29-Nov-1929 (87 y.o. F) Treating [name]: Kristin [name] Primary Care Navaeh Kehres: Nobie [name] Other Clinician: Referring Alexiz Cothran: Nobie [name] Kristin Torri Langston/Extender: Kristin [name] in  Treatment: 3 Vital Signs Time Taken: 11:32 Temperature (F): 98.6 Height (in): 67 Pulse (bpm): 79 Weight (lbs): 147 Respiratory Rate (breaths/min): 20 Body Mass Index (BMI): 23 Blood Pressure (mmHg): 138/81 Reference Range: 80 - 120 mg / dl Electronic Signature(s) Signed: 01/27/2021 4:33:14 PM By: Kristin Coria [name] Entered By: Kristin [name] on 01/27/2021 11:32:18

## 2021-01-27 NOTE — Progress Notes (Addendum)
[name], [name] ([id number]) Visit Report for 01/27/2021 Chief Complaint Document Details Patient Name: [name], [name] Date of Service: 01/27/2021 11:30 AM Medical Record Number: [id number] Patient Account Number: [id number] Date of Birth/Sex: Mar 27, 1930 (87 y.o. F) Treating [name]: Dolan Amen Primary Care Provider: [name] Other Clinician: Referring Provider: [name] Treating Provider/Extender: [name] in Treatment: 3 Information Obtained from: Patient Chief Complaint Left ankle and left foot ulcers Electronic Signature(s) Signed: 01/27/2021 11:52:07 AM By: Worthy Keeler PA-C Entered By: Worthy Keeler on 01/27/2021 11:52:07 [name], [name] ([id number]) -------------------------------------------------------------------------------- HPI Details Patient Name: [name], [name] Date of Service: 01/27/2021 11:30 AM Medical Record Number: [id number] Patient Account Number: [id number] Date of Birth/Sex: August 04, 1930 (87 y.o. F) Treating [name]: Dolan Amen Primary Care Provider: [name] Other Clinician: Referring Provider: [name] Treating Provider/Extender: [name] in Treatment: 3 History of Present Illness HPI Description: Left 0.81 Right 0.83 12/25/20 About a year but patient is not sure how long for sure 01/06/2021 upon evaluation today patient presents for initial inspection here in our clinic concerning issues she has been having in 2 areas one being on her left lateral malleolus. This is the most significant of the 2 regions based on what I am seeing today. The other is actually over her left dorsal 3rd toe. With that being said the patient tells me that currently she has been tolerating the dressing changes without complication. There does not appear to be any signs of obvious infection at this time but nonetheless she does not seem to be doing a lot better. She is mainly just been putting a cream on the areas but  she is not sure what cream this was when I questioned her about that today she states "it may be on my med list" although I did not see that. The patient is really not a very good historian in general concerning how long this has been going on and what exactly is going on. The patient does have a history of neuropathy, chronic kidney disease, atrial fibrillation and is on long-term anticoagulant therapy as well as aspirin. This includes Eliquis. 5/24; patient has an open area on the left lateral malleolus as well as an area on the base of her third toe which is mycotic also on the left. We have been using Hydrofera Blue as of last week. Biopsy that was done showed chronic stasis dermatitis with areas of healing erosion and overlying inflammatory crust. Negative for malignancy and negative for fungal organisms. X-ray of the foot did not show anything abnormal except degenerative changes 6/1; left lateral malleolus wound had a lot of callus and dry skin on the surface. I remove this there is only 2 small open areas. The base of the left third toe at the base of the mycotic nail also required debridement we have been using silver alginate in both areas 01/27/2021 upon evaluation today patient appears to be doing decently well in regard to her wounds. In fact she appears to be healed based on what I am seeing at this point. I do not see any signs of active infection at this time. Electronic Signature(s) Signed: 01/27/2021 5:41:24 PM By: Worthy Keeler PA-C Entered By: Worthy Keeler on 01/27/2021 17:41:24 [name], [name] ([id number]) -------------------------------------------------------------------------------- Physical Exam Details Patient Name: [name], [name] Date of Service: 01/27/2021 11:30 AM Medical Record Number: [id number] Patient Account Number: [id number] Date of Birth/Sex: 02/02/1930 (87 y.o. F) Treating [name]: Dolan Amen Primary Care Provider: [name]  Other  Clinician: Referring Provider: [name] Treating Provider/Extender: [name] in Treatment: 3 Constitutional Well-nourished and well-hydrated in no acute distress. Respiratory normal breathing without difficulty. Psychiatric this patient is able to make decisions and demonstrates good insight into disease process. Alert and Oriented x 3. pleasant and cooperative. Notes Patient's wounds currently again are completely resolved based on what I see I do not feel like there is any evidence of infection currently and nothing that I am concerned about in general. And overall I feel like she is making great progress. Electronic Signature(s) Signed: 01/27/2021 5:41:38 PM By: Worthy Keeler PA-C Entered By: Worthy Keeler on 01/27/2021 17:41:38 [name], [name] ([id number]) -------------------------------------------------------------------------------- Physician Orders Details Patient Name: [name], [name] Date of Service: 01/27/2021 11:30 AM Medical Record Number: [id number] Patient Account Number: [id number] Date of Birth/Sex: [birth year] (87 y.o. F) Treating [name]: Dolan Amen Primary Care Provider: [name] Other Clinician: Referring Provider: [name] Treating Provider/Extender: [name] in Treatment: 3 Verbal / Phone Orders: No Diagnosis Coding ICD-10 Coding Code Description G90.09 Other idiopathic peripheral autonomic neuropathy L97.322 Non-pressure chronic ulcer of left ankle with fat layer exposed L97.522 Non-pressure chronic ulcer of other part of left foot with fat layer exposed N18.9 Chronic kidney disease, unspecified I48.0 Paroxysmal atrial fibrillation Z79.01 Long term (current) use of anticoagulants Discharge From [facility] o Discharge from [facility] Complete o Transfer Care to other provider - Podiatry referral Consults o Podiatry - Dr. Florence Canner for foot assessment/toenail  trimming Electronic Signature(s) Signed: 01/27/2021 4:19:01 PM By: Charlett Nose [name] Signed: 01/27/2021 6:18:59 PM By: Worthy Keeler PA-C Entered By: Georges Mouse, Kristin Breeding on 01/27/2021 12:00:15 [name], [name] ([id number]) -------------------------------------------------------------------------------- Problem List Details Patient Name: [name], [name] Date of Service: 01/27/2021 11:30 AM Medical Record Number: [id number] Patient Account Number: [id number] Date of Birth/Sex: [birth year] (87 y.o. F) Treating [name]: Dolan Amen Primary Care Provider: [name] Other Clinician: Referring Provider: [name] Treating Provider/Extender: [name] in Treatment: 3 Active Problems ICD-10 Encounter Code Description Active Date MDM Diagnosis G90.09 Other idiopathic peripheral autonomic neuropathy 01/06/2021 No Yes L97.322 Non-pressure chronic ulcer of left ankle with fat layer exposed 01/06/2021 No Yes L97.522 Non-pressure chronic ulcer of other part of left foot with fat layer 01/06/2021 No Yes exposed N18.9 Chronic kidney disease, unspecified 01/06/2021 No Yes I48.0 Paroxysmal atrial fibrillation 01/06/2021 No Yes Z79.01 Long term (current) use of anticoagulants 01/06/2021 No Yes Inactive Problems Resolved Problems Electronic Signature(s) Signed: 01/27/2021 11:52:01 AM By: Worthy Keeler PA-C Entered By: Worthy Keeler on 01/27/2021 11:52:01 Nauman, Nakeitha L. ([id number]) -------------------------------------------------------------------------------- Progress Note Details Patient Name: [name], [name] Date of Service: 01/27/2021 11:30 AM Medical Record Number: [id number] Patient Account Number: [id number] Date of Birth/Sex: 08-02-1930 (87 y.o. F) Treating [name]: Dolan Amen Primary Care Provider: [name] Other Clinician: Referring Provider: [name] Treating Provider/Extender: [name] in Treatment: 3 Subjective Chief  Complaint Information obtained from Patient Left ankle and left foot ulcers History of Present Illness (HPI) Left 0.81 Right 0.83 12/25/20 About a year but patient is not sure how long for sure 01/06/2021 upon evaluation today patient presents for initial inspection here in our clinic concerning issues she has been having in 2 areas one being on her left lateral malleolus. This is the most significant of the 2 regions based on what I am seeing today. The other is actually over her left dorsal 3rd toe. With that being said the patient tells me  that currently she has been tolerating the dressing changes without complication. There does not appear to be any signs of obvious infection at this time but nonetheless she does not seem to be doing a lot better. She is mainly just been putting a cream on the areas but she is not sure what cream this was when I questioned her about that today she states "it may be on my med list" although I did not see that. The patient is really not a very good historian in general concerning how long this has been going on and what exactly is going on. The patient does have a history of neuropathy, chronic kidney disease, atrial fibrillation and is on long-term anticoagulant therapy as well as aspirin. This includes Eliquis. 5/24; patient has an open area on the left lateral malleolus as well as an area on the base of her third toe which is mycotic also on the left. We have been using Hydrofera Blue as of last week. Biopsy that was done showed chronic stasis dermatitis with areas of healing erosion and overlying inflammatory crust. Negative for malignancy and negative for fungal organisms. X-ray of the foot did not show anything abnormal except degenerative changes 6/1; left lateral malleolus wound had a lot of callus and dry skin on the surface. I remove this there is only 2 small open areas. The base of the left third toe at the base of the mycotic nail also required  debridement we have been using silver alginate in both areas 01/27/2021 upon evaluation today patient appears to be doing decently well in regard to her wounds. In fact she appears to be healed based on what I am seeing at this point. I do not see any signs of active infection at this time. Objective Constitutional Well-nourished and well-hydrated in no acute distress. Vitals Time Taken: 11:32 AM, Height: 67 in, Weight: 147 lbs, BMI: 23, Temperature: 98.6 F, Pulse: 79 bpm, Respiratory Rate: 20 breaths/min, Blood Pressure: 138/81 mmHg. Respiratory normal breathing without difficulty. Psychiatric this patient is able to make decisions and demonstrates good insight into disease process. Alert and Oriented x 3. pleasant and cooperative. General Notes: Patient's wounds currently again are completely resolved based on what I see I do not feel like there is any evidence of infection currently and nothing that I am concerned about in general. And overall I feel like she is making great progress. Integumentary (Hair, Skin) Wound #1 status is Healed - Epithelialized. Original cause of wound was Gradually Appeared. The date acquired was: 09/23/2020. The wound has been in treatment 3 weeks. The wound is located on the Left Malleolus. The wound measures 0cm length x 0cm width x 0cm depth; 0cm^2 area and 0cm^3 volume. There is no tunneling or undermining noted. There is a none present amount of drainage noted. There is no granulation [name], [name] ([id number]) within the wound bed. There is no necrotic tissue within the wound bed. Wound #2 status is Healed - Epithelialized. Original cause of wound was Gradually Appeared. The date acquired was: 04/23/2020. The wound has been in treatment 3 weeks. The wound is located on the Left,Dorsal Toe Third. The wound measures 0cm length x 0cm width x 0cm depth; 0cm^2 area and 0cm^3 volume. There is no tunneling or undermining noted. There is a none present amount of  drainage noted. There is no granulation within the wound bed. There is no necrotic tissue within the wound bed. 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 Florence Canner for foot assessment/toenail trimming 1. Would recommend currently that we discontinue wound care services as she is completely healed. 2. I am good to make a referral to Dr. Amalia Hailey who is a local podiatrist in order to have a consult in regard to the toenails and the need for trimming. I think that she could benefit from his services. She is in agreement with this plan. We will see her back for follow-up visit as needed. Electronic Signature(s) Signed: 01/27/2021 5:42:23 PM By: Worthy Keeler PA-C Entered By: Worthy Keeler on 01/27/2021 17:42:23 [name], [name] ([id number]) -------------------------------------------------------------------------------- SuperBill Details Patient Name: Kenedy, Miata L. Date of Service: 01/27/2021 Medical Record Number: [id number] Patient Account Number: [id number] Date of Birth/Sex: 17-May-1930 (87 y.o. F) Treating [name]: Dolan Amen Primary Care Provider: [name] Other Clinician: Referring Provider: [name] Treating Provider/Extender: [name] in Treatment: 3 Diagnosis Coding ICD-10 Codes Code Description G90.09 Other idiopathic peripheral autonomic neuropathy L97.322 Non-pressure chronic ulcer of left ankle with fat layer exposed L97.522 Non-pressure chronic ulcer of other part of left  foot with fat layer exposed N18.9 Chronic kidney disease, unspecified I48.0 Paroxysmal atrial fibrillation Z79.01 Long term (current) use of anticoagulants Facility Procedures CPT4 Code: [id number] Description: [phone] - WOUND CARE VISIT-LEV 2 EST PT Modifier: Quantity: 1 Physician Procedures CPT4 Code: [id number] Description: 99213 - WC PHYS LEVEL 3 - EST PT Modifier: Quantity: 1 CPT4 Code: Description: ICD-10 Diagnosis Description G90.09 Other idiopathic peripheral autonomic neuropathy L97.322 Non-pressure chronic ulcer of left ankle with fat layer exposed L97.522 Non-pressure chronic ulcer of other part of left foot with fat layer ex  N18.9 Chronic kidney disease, unspecified Modifier: posed Quantity: Electronic Signature(s) Signed: 01/27/2021 5:42:39 PM By: Worthy Keeler PA-C Previous Signature: 01/27/2021 4:19:01 PM Version By: Georges Mouse, Kristin [name] Entered By: Worthy Keeler on 01/27/2021 17:42:39

## 2021-01-27 NOTE — Progress Notes (Signed)
Patient is wondering if she should start back on her Gabapentin.  It is in her med list but she states she is not currently taking.

## 2021-01-28 ENCOUNTER — Other Ambulatory Visit: Payer: Self-pay | Admitting: Family Medicine

## 2021-01-28 ENCOUNTER — Ambulatory Visit: Payer: Medicare HMO | Attending: Pain Medicine | Admitting: Pain Medicine

## 2021-01-28 DIAGNOSIS — M542 Cervicalgia: Secondary | ICD-10-CM | POA: Diagnosis not present

## 2021-01-28 DIAGNOSIS — M961 Postlaminectomy syndrome, not elsewhere classified: Secondary | ICD-10-CM

## 2021-01-28 DIAGNOSIS — M5412 Radiculopathy, cervical region: Secondary | ICD-10-CM

## 2021-01-28 DIAGNOSIS — M25559 Pain in unspecified hip: Secondary | ICD-10-CM

## 2021-01-28 DIAGNOSIS — G8929 Other chronic pain: Secondary | ICD-10-CM

## 2021-01-28 DIAGNOSIS — M47812 Spondylosis without myelopathy or radiculopathy, cervical region: Secondary | ICD-10-CM | POA: Diagnosis not present

## 2021-01-28 DIAGNOSIS — Z79891 Long term (current) use of opiate analgesic: Secondary | ICD-10-CM

## 2021-01-28 DIAGNOSIS — M5416 Radiculopathy, lumbar region: Secondary | ICD-10-CM

## 2021-01-28 DIAGNOSIS — G894 Chronic pain syndrome: Secondary | ICD-10-CM

## 2021-01-28 DIAGNOSIS — G2581 Restless legs syndrome: Secondary | ICD-10-CM

## 2021-01-28 DIAGNOSIS — F1190 Opioid use, unspecified, uncomplicated: Secondary | ICD-10-CM

## 2021-01-28 DIAGNOSIS — M5441 Lumbago with sciatica, right side: Secondary | ICD-10-CM

## 2021-01-28 DIAGNOSIS — M79604 Pain in right leg: Secondary | ICD-10-CM | POA: Diagnosis not present

## 2021-01-28 DIAGNOSIS — M79605 Pain in left leg: Secondary | ICD-10-CM

## 2021-01-28 DIAGNOSIS — M5442 Lumbago with sciatica, left side: Secondary | ICD-10-CM

## 2021-01-28 DIAGNOSIS — Z79899 Other long term (current) drug therapy: Secondary | ICD-10-CM

## 2021-01-28 MED ORDER — OXYCODONE HCL 10 MG PO TABS: 10.0000 mg | ORAL_TABLET | Freq: Two times a day (BID) | ORAL | 0 refills | Status: DC | PRN

## 2021-01-28 NOTE — Patient Instructions (Signed)
____________________________________________________________________________________________  Medication Rules  Purpose: To inform patients, and their family members, of our rules and regulations.  Applies to: All patients receiving prescriptions (written or electronic).  Pharmacy of record: Pharmacy where electronic prescriptions will be sent. If written prescriptions are taken to a different pharmacy, please inform the nursing staff. The pharmacy listed in the electronic medical record should be the one where you would like electronic prescriptions to be sent.  Electronic prescriptions: In compliance with the [place] Strengthen Opioid Misuse Prevention (STOP) Act of 2017 (Session Law 2017-74/H243), effective August 23, 2018, all controlled substances must be electronically prescribed. Calling prescriptions to the pharmacy will cease to exist.  Prescription refills: Only during scheduled appointments. Applies to all prescriptions.  NOTE: The following applies primarily to controlled substances (Opioid* Pain Medications).   Type of encounter (visit): For patients receiving controlled substances, face-to-face visits are required. (Not an option or up to the patient.)  Patient's responsibilities: 1. Pain Pills: Bring all pain pills to every appointment (except for procedure appointments). 2. Pill Bottles: Bring pills in original pharmacy bottle. Always bring the newest bottle. Bring bottle, even if empty. 3. Medication refills: You are responsible for knowing and keeping track of what medications you take and those you need refilled. The day before your appointment: write a list of all prescriptions that need to be refilled. The day of the appointment: give the list to the admitting nurse. Prescriptions will be written only during appointments. No prescriptions will be written on procedure days. If you forget a medication: it will not be "Called in", "Faxed", or "electronically sent".  You will need to get another appointment to get these prescribed. No early refills. Do not call asking to have your prescription filled early. 4. Prescription Accuracy: You are responsible for carefully inspecting your prescriptions before leaving our office. Have the discharge nurse carefully go over each prescription with you, before taking them home. Make sure that your name is accurately spelled, that your address is correct. Check the name and dose of your medication to make sure it is accurate. Check the number of pills, and the written instructions to make sure they are clear and accurate. Make sure that you are given enough medication to last until your next medication refill appointment. 5. Taking Medication: Take medication as prescribed. When it comes to controlled substances, taking less pills or less frequently than prescribed is permitted and encouraged. Never take more pills than instructed. Never take medication more frequently than prescribed.  6. Inform other Doctors: Always inform, all of your healthcare providers, of all the medications you take. 7. Pain Medication from other Providers: You are not allowed to accept any additional pain medication from any other Doctor or Healthcare provider. There are two exceptions to this rule. (see below) In the event that you require additional pain medication, you are responsible for notifying us, as stated below. 8. Cough Medicine: Often these contain an opioid, such as codeine or hydrocodone. Never accept or take cough medicine containing these opioids if you are already taking an opioid* medication. The combination may cause respiratory failure and death. 9. Medication Agreement: You are responsible for carefully reading and following our Medication Agreement. This must be signed before receiving any prescriptions from our practice. Safely store a copy of your signed Agreement. Violations to the Agreement will result in no further prescriptions.  (Additional copies of our Medication Agreement are available upon request.) 10. Laws, Rules, & Regulations: All patients are expected to follow all   Federal and State Laws, Statutes, Rules, & Regulations. Ignorance of the Laws does not constitute a valid excuse.  11. Illegal drugs and Controlled Substances: The use of illegal substances (including, but not limited to marijuana and its derivatives) and/or the illegal use of any controlled substances is strictly prohibited. Violation of this rule may result in the immediate and permanent discontinuation of any and all prescriptions being written by our practice. The use of any illegal substances is prohibited. 12. Adopted CDC guidelines & recommendations: Target dosing levels will be at or below 60 MME/day. Use of benzodiazepines** is not recommended.  Exceptions: There are only two exceptions to the rule of not receiving pain medications from other Healthcare Providers. 1. Exception #1 (Emergencies): In the event of an emergency (i.e.: accident requiring emergency care), you are allowed to receive additional pain medication. However, you are responsible for: As soon as you are able, call our office [phone], at any time of the day or night, and leave a message stating your name, the date and nature of the emergency, and the name and dose of the medication prescribed. In the event that your call is answered by a member of our staff, make sure to document and save the date, time, and the name of the person that took your information.  2. Exception #2 (Planned Surgery): In the event that you are scheduled by another doctor or dentist to have any type of surgery or procedure, you are allowed (for a period no longer than 30 days), to receive additional pain medication, for the acute post-op pain. However, in this case, you are responsible for picking up a copy of our "Post-op Pain Management for Surgeons" handout, and giving it to your surgeon or dentist. This  document is available at our office, and does not require an appointment to obtain it. Simply go to our office during business hours (Monday-Thursday from 8:00 AM to 4:00 PM) (Friday 8:00 AM to 12:00 Noon) or if you have a scheduled appointment with us, prior to your surgery, and ask for it by name. In addition, you are responsible for: calling our office [phone], at any time of the day or night, and leaving a message stating your name, name of your surgeon, type of surgery, and date of procedure or surgery. Failure to comply with your responsibilities may result in termination of therapy involving the controlled substances.  *Opioid medications include: morphine, codeine, oxycodone, oxymorphone, hydrocodone, hydromorphone, meperidine, tramadol, tapentadol, buprenorphine, fentanyl, methadone. **Benzodiazepine medications include: diazepam (Valium), alprazolam (Xanax), clonazepam (Klonopine), lorazepam (Ativan), clorazepate (Tranxene), chlordiazepoxide (Librium), estazolam (Prosom), oxazepam (Serax), temazepam (Restoril), triazolam (Halcion) (Last updated: 07/21/2020) ____________________________________________________________________________________________   ____________________________________________________________________________________________  Medication Recommendations and Reminders  Applies to: All patients receiving prescriptions (written and/or electronic).  Medication Rules & Regulations: These rules and regulations exist for your safety and that of others. They are not flexible and neither are we. Dismissing or ignoring them will be considered "non-compliance" with medication therapy, resulting in complete and irreversible termination of such therapy. (See document titled "Medication Rules" for more details.) In all conscience, because of safety reasons, we cannot continue providing a therapy where the patient does not follow instructions.  Pharmacy of record:   Definition:  This is the pharmacy where your electronic prescriptions will be sent.   We do not endorse any particular pharmacy, however, we have experienced problems with [facility] not securing enough medication supply for the community.  We do not restrict you in your choice of pharmacy. However,   once we write for your prescriptions, we will NOT be re-sending more prescriptions to fix restricted supply problems created by your pharmacy, or your insurance.   The pharmacy listed in the electronic medical record should be the one where you want electronic prescriptions to be sent.  If you choose to change pharmacy, simply notify our nursing staff.  Recommendations:  Keep all of your pain medications in a safe place, under lock and key, even if you live alone. We will NOT replace lost, stolen, or damaged medication.  After you fill your prescription, take 1 week's worth of pills and put them away in a safe place. You should keep a separate, properly labeled bottle for this purpose. The remainder should be kept in the original bottle. Use this as your primary supply, until it runs out. Once it's gone, then you know that you have 1 week's worth of medicine, and it is time to come in for a prescription refill. If you do this correctly, it is unlikely that you will ever run out of medicine.  To make sure that the above recommendation works, it is very important that you make sure your medication refill appointments are scheduled at least 1 week before you run out of medicine. To do this in an effective manner, make sure that you do not leave the office without scheduling your next medication management appointment. Always ask the nursing staff to show you in your prescription , when your medication will be running out. Then arrange for the receptionist to get you a return appointment, at least 7 days before you run out of medicine. Do not wait until you have 1 or 2 pills left, to come in. This is very poor planning and  does not take into consideration that we may need to cancel appointments due to bad weather, sickness, or emergencies affecting our staff.  DO NOT ACCEPT A "Partial Fill": If for any reason your pharmacy does not have enough pills/tablets to completely fill or refill your prescription, do not allow for a "partial fill". The law allows the pharmacy to complete that prescription within 72 hours, without requiring a new prescription. If they do not fill the rest of your prescription within those 72 hours, you will need a separate prescription to fill the remaining amount, which we will NOT provide. If the reason for the partial fill is your insurance, you will need to talk to the pharmacist about payment alternatives for the remaining tablets, but again, DO NOT ACCEPT A PARTIAL FILL, unless you can trust your pharmacist to obtain the remainder of the pills within 72 hours.  Prescription refills and/or changes in medication(s):   Prescription refills, and/or changes in dose or medication, will be conducted only during scheduled medication management appointments. (Applies to both, written and electronic prescriptions.)  No refills on procedure days. No medication will be changed or started on procedure days. No changes, adjustments, and/or refills will be conducted on a procedure day. Doing so will interfere with the diagnostic portion of the procedure.  No phone refills. No medications will be "called into the pharmacy".  No Fax refills.  No weekend refills.  No Holliday refills.  No after hours refills.  Remember:  Business hours are:  Monday to Thursday 8:00 AM to 4:00 PM Provider's Schedule: Rayan Ines, MD - Appointments are:  Medication management: Monday and Wednesday 8:00 AM to 4:00 PM Procedure day: Tuesday and Thursday 7:30 AM to 4:00 PM Bilal Lateef, MD - Appointments are:    Medication management: Tuesday and Thursday 8:00 AM to 4:00 PM Procedure day: Monday and Wednesday  7:30 AM to 4:00 PM (Last update: 03/12/2020) ____________________________________________________________________________________________   ____________________________________________________________________________________________  CBD (cannabidiol) WARNING  Applicable to: All individuals currently taking or considering taking CBD (cannabidiol) and, more important, all patients taking opioid analgesic controlled substances (pain medication). (Example: oxycodone; oxymorphone; hydrocodone; hydromorphone; morphine; methadone; tramadol; tapentadol; fentanyl; buprenorphine; butorphanol; dextromethorphan; meperidine; codeine; etc.)  Legal status: CBD remains a Schedule I drug prohibited for any use. CBD is illegal with one exception. In the [place], CBD has a limited Food and Drug Administration (FDA) approval for the treatment of two specific types of epilepsy disorders. Only one CBD product has been approved by the FDA for this purpose: "Epidiolex". FDA is aware that some companies are marketing products containing cannabis and cannabis-derived compounds in ways that violate the Federal Food, Drug and Cosmetic Act (FD&C Act) and that may put the health and safety of consumers at risk. The FDA, a Federal agency, has not enforced the CBD status since 2018.   Legality: Some manufacturers ship CBD products nationally, which is illegal. Often such products are sold online and are therefore available throughout the country. CBD is openly sold in head shops and health food stores in some states where such sales have not been explicitly legalized. Selling unapproved products with unsubstantiated therapeutic claims is not only a violation of the law, but also can put patients at risk, as these products have not been proven to be safe or effective. Federal illegality makes it difficult to conduct research on CBD.  Reference: "FDA Regulation of Cannabis and Cannabis-Derived Products, Including Cannabidiol  (CBD)" - https://www.fda.gov/news-events/public-health-focus/fda-regulation-cannabis-and-cannabis-derived-products-including-cannabidiol-cbd  Warning: CBD is not FDA approved and has not undergo the same manufacturing controls as prescription drugs.  This means that the purity and safety of available CBD may be questionable. Most of the time, despite manufacturer's claims, it is contaminated with THC (delta-9-tetrahydrocannabinol - the chemical in marijuana responsible for the "HIGH").  When this is the case, the THC contaminant will trigger a positive urine drug screen (UDS) test for Marijuana (carboxy-THC). Because a positive UDS for any illicit substance is a violation of our medication agreement, your opioid analgesics (pain medicine) may be permanently discontinued.  MORE ABOUT CBD  General Information: CBD  is a derivative of the Marijuana (cannabis sativa) plant discovered in 1940. It is one of the 113 identified substances found in Marijuana. It accounts for up to 40% of the plant's extract. As of 2018, preliminary clinical studies on CBD included research for the treatment of anxiety, movement disorders, and pain. CBD is available and consumed in multiple forms, including inhalation of smoke or vapor, as an aerosol spray, and by mouth. It may be supplied as an oil containing CBD, capsules, dried cannabis, or as a liquid solution. CBD is thought not to be as psychoactive as THC (delta-9-tetrahydrocannabinol - the chemical in marijuana responsible for the "HIGH"). Studies suggest that CBD may interact with different biological target receptors in the body, including cannabinoid and other neurotransmitter receptors. As of 2018 the mechanism of action for its biological effects has not been determined.  Side-effects  Adverse reactions: Dry mouth, diarrhea, decreased appetite, fatigue, drowsiness, malaise, weakness, sleep disturbances, and others.  Drug interactions: CBC may interact with other  medications such as blood-thinners. (Last update: 03/29/2020) ____________________________________________________________________________________________   ____________________________________________________________________________________________  Drug Holidays (Slow)  What is a "Drug Holiday"? Drug Holiday: is the name given to the period of time during   which a patient stops taking a medication(s) for the purpose of eliminating tolerance to the drug.  Benefits . Improved effectiveness of opioids. . Decreased opioid dose needed to achieve benefits. . Improved pain with lesser dose.  What is tolerance? Tolerance: is the progressive decreased in effectiveness of a drug due to its repetitive use. With repetitive use, the body gets use to the medication and as a consequence, it loses its effectiveness. This is a common problem seen with opioid pain medications. As a result, a larger dose of the drug is needed to achieve the same effect that used to be obtained with a smaller dose.  How long should a "Drug Holiday" last? You should stay off of the pain medicine for at least 14 consecutive days. (2 weeks)  Should I stop the medicine "cold turkey"? No. You should always coordinate with your Pain Specialist so that he/she can provide you with the correct medication dose to make the transition as smoothly as possible.  How do I stop the medicine? Slowly. You will be instructed to decrease the daily amount of pills that you take by one (1) pill every seven (7) days. This is called a "slow downward taper" of your dose. For example: if you normally take four (4) pills per day, you will be asked to drop this dose to three (3) pills per day for seven (7) days, then to two (2) pills per day for seven (7) days, then to one (1) per day for seven (7) days, and at the end of those last seven (7) days, this is when the "Drug Holiday" would start.   Will I have withdrawals? By doing a "slow downward  taper" like this one, it is unlikely that you will experience any significant withdrawal symptoms. Typically, what triggers withdrawals is the sudden stop of a high dose opioid therapy. Withdrawals can usually be avoided by slowly decreasing the dose over a prolonged period of time. If you do not follow these instructions and decide to stop your medication abruptly, withdrawals may be possible.  What are withdrawals? Withdrawals: refers to the wide range of symptoms that occur after stopping or dramatically reducing opiate drugs after heavy and prolonged use. Withdrawal symptoms do not occur to patients that use low dose opioids, or those who take the medication sporadically. Contrary to benzodiazepine (example: Valium, Xanax, etc.) or alcohol withdrawals ("Delirium Tremens"), opioid withdrawals are not lethal. Withdrawals are the physical manifestation of the body getting rid of the excess receptors.  Expected Symptoms Early symptoms of withdrawal may include: . Agitation . Anxiety . Muscle aches . Increased tearing . Insomnia . Runny nose . Sweating . Yawning  Late symptoms of withdrawal may include: . Abdominal cramping . Diarrhea . Dilated pupils . Goose bumps . Nausea . Vomiting  Will I experience withdrawals? Due to the slow nature of the taper, it is very unlikely that you will experience any.  What is a slow taper? Taper: refers to the gradual decrease in dose.  (Last update: 03/12/2020) ____________________________________________________________________________________________     

## 2021-01-29 DIAGNOSIS — I495 Sick sinus syndrome: Secondary | ICD-10-CM | POA: Diagnosis not present

## 2021-01-29 DIAGNOSIS — E785 Hyperlipidemia, unspecified: Secondary | ICD-10-CM | POA: Diagnosis not present

## 2021-01-29 DIAGNOSIS — I480 Paroxysmal atrial fibrillation: Secondary | ICD-10-CM | POA: Diagnosis not present

## 2021-01-29 DIAGNOSIS — I10 Essential (primary) hypertension: Secondary | ICD-10-CM | POA: Diagnosis not present

## 2021-01-29 DIAGNOSIS — I5032 Chronic diastolic (congestive) heart failure: Secondary | ICD-10-CM | POA: Diagnosis not present

## 2021-01-29 DIAGNOSIS — Z950 Presence of cardiac pacemaker: Secondary | ICD-10-CM | POA: Diagnosis not present

## 2021-02-02 ENCOUNTER — Encounter (INDEPENDENT_AMBULATORY_CARE_PROVIDER_SITE_OTHER): Payer: Medicare HMO

## 2021-02-02 ENCOUNTER — Ambulatory Visit (INDEPENDENT_AMBULATORY_CARE_PROVIDER_SITE_OTHER): Payer: Medicare HMO | Admitting: Pharmacist

## 2021-02-02 ENCOUNTER — Ambulatory Visit (INDEPENDENT_AMBULATORY_CARE_PROVIDER_SITE_OTHER): Payer: Medicare HMO | Admitting: Vascular Surgery

## 2021-02-02 DIAGNOSIS — J432 Centrilobular emphysema: Secondary | ICD-10-CM

## 2021-02-02 DIAGNOSIS — I4891 Unspecified atrial fibrillation: Secondary | ICD-10-CM

## 2021-02-02 DIAGNOSIS — I4892 Unspecified atrial flutter: Secondary | ICD-10-CM | POA: Diagnosis not present

## 2021-02-02 NOTE — Patient Instructions (Signed)
Visit Information  PATIENT GOALS:  Goals Addressed   None     The patient verbalized understanding of instructions, educational materials, and care plan provided today and declined offer to receive copy of patient instructions, educational materials, and care plan.  Patient declines further CM Pharmacy needs at this time The patient has been provided with contact information for CM Pharmacist and has been advised to call with any medication related questions or concerns.    Harlow Asa, PharmD, [place], CPP Clinical Pharmacist [facility] [phone]

## 2021-02-02 NOTE — Chronic Care Management (AMB) (Signed)
Chronic Care Management Pharmacy Note  02/02/2021 Name:  Kristin [name] MRN:  [id number] DOB:  [birth year]  Subjective: Kristin [name] is an 87 y.o. year old female who is a primary patient of Olin Hauser, DO.  The CCM team was consulted for assistance with disease management and care coordination needs.    Engaged with patient by telephone for follow up visit in response to provider referral for pharmacy case management and/or care coordination services.   Consent to Services:  The patient was given information about Chronic Care Management services, agreed to services, and gave verbal consent prior to initiation of services.  Please see initial visit note for detailed documentation.   Patient Care Team: Olin Hauser, DO as PCP - General (Family Medicine) Parks Ranger Devonne Doughty, DO as Consulting Physician (Family Medicine) Isaias Cowman, MD as Consulting Physician (Cardiology) Milinda Pointer, MD as Referring Physician (Pain Medicine) Alisa Graff, FNP as Nurse Practitioner (Family Medicine) [address], Virl Diamond, [place] as Pharmacist [place], Tomasa Blase, RN as [place] Management  Recent office visits: None  Recent consult visits: Office Visit with Wound Care on 6/7  Office Visit with [facility] Pulmonology on 6/2  Hospital visits: Patient seen in [place] ED on 3/18 for weakness Patient seen in [facility] ED on 12/28 for leg pain/swelling Patient [facility] to [facility] from 12/11-12/14/2021 for Acute respiratory failure with hypoxia  Objective:  BP Readings from Last 3 Encounters:  12/24/20 (!) 148/70  11/24/20 (!) 128/98  11/11/20 (!) 72/50   Pulse Readings from Last 3 Encounters:  12/24/20 84  11/24/20 67  11/11/20 69   Wt Readings from Last 3 Encounters:  12/24/20 146 lb (66.2 kg)  11/24/20 145 lb (65.8 kg)  11/11/20 145 lb (65.8 kg)    Assessment: Review of patient past medical  history, allergies, medications, health status, including review of consultants reports, laboratory and other test data, was performed as part of comprehensive evaluation and provision of chronic care management services.   SDOH:  (Social Determinants of Health) assessments and interventions performed: none   CCM Care Plan  Allergies  Allergen Reactions   Flexeril [Cyclobenzaprine Hcl]     Severe Rash   Vancomycin Rash    Red Mans Syndrome    Medications Reviewed Today     Reviewed by Milinda Pointer, MD (Physician) on 01/28/21 at [place] List Status: <None>   Medication Order Taking? Sig Documenting Provider Last Dose Status Informant  acetaminophen (TYLENOL) 500 MG tablet [id number] Yes Take 500 mg by mouth in the morning and at bedtime. [provider] Taking Active Self  albuterol (PROVENTIL) (2.5 MG/3ML) 0.083% nebulizer solution [id number] Yes Take 3 mLs (2.5 mg total) by nebulization every 8 (eight) hours as needed for wheezing or shortness of breath. Olin Hauser, DO Taking Expired 10/01/20 2359 Self  apixaban (ELIQUIS) 5 MG TABS tablet [id number] Yes Take 1 tablet (5 mg total) by mouth 2 (two) times daily. Loletha Grayer, MD Taking Active Self           Med Note Vinie Sill Oct 07, 2020 11:26 AM)    aspirin EC 81 MG tablet [id number] Yes Take 1 tablet (81 mg total) by mouth daily. Swallow whole. Schnier, Dolores Lory, MD Taking Active   atorvastatin (LIPITOR) 20 MG tablet [id number] Yes Take 1 tablet (20 mg total) by mouth daily. Schnier, Dolores Lory, MD Taking Expired 01/12/21 2359   bisacodyl (DULCOLAX)  5 MG EC tablet [id number] Yes Take 2 tablets (10 mg total) by mouth daily as needed for moderate constipation. Olin Hauser, DO Taking Active Self  Cholecalciferol (VITAMIN D) 2000 units tablet [id number] Yes Take 2,000 Units by mouth daily.  [provider] Taking Active Self  dextromethorphan-guaiFENesin (MUCINEX DM) 30-600 MG  12hr tablet [id number] Yes Take 1 tablet by mouth 2 (two) times daily. [provider] Taking Active Self  furosemide (LASIX) 40 MG tablet [id number] Yes TAKE (1) TABLET BY MOUTH ONCE DAILY AS NEEDED.  Patient taking differently: Take 20 mg by mouth daily.   Kathrine Haddock, NP Taking Active   levocetirizine (XYZAL) 5 MG tablet [id number] Yes Take 0.5 tablets (2.5 mg total) by mouth every other day. Olin Hauser, DO Taking Active Self  lidocaine (XYLOCAINE) 5 % ointment [id number] Yes Apply 1 application topically daily as needed. Kris Hartmann, NP Taking Active   lisinopril (ZESTRIL) 10 MG tablet [id number] Yes Take by mouth. [provider] Taking Active   Melatonin 10 MG TABS [id number] Yes Take 10 mg by mouth at bedtime. [provider] Taking Active Self  Oxycodone HCl 10 MG TABS [id number] Yes Take 1 tablet (10 mg total) by mouth 2 (two) times daily as needed. Must last 30 days. Milinda Pointer, MD Taking Active            Med Note Dossie Arbour, [place] A   Wed Jan 28, 2021  9:06 AM) FUTURE Prescription. (NOT a DUPLICATE!!) >>>DO NOT DELETE<<< (even if Expired!) See Care Coordination Note from [facility] Pain Management (Dr. Dossie Arbour)   OXYGEN [id number] Yes Inhale 2 L into the lungs at bedtime.  [provider] Taking Active Self           Med Note (HILL, TIFFANY A   Tue Apr 25, 2018  3:19 PM) At night as needed  rOPINIRole (REQUIP) 2 MG tablet [id number] Yes Take 2 tablets (4 mg total) by mouth at bedtime. Olin Hauser, DO Taking Active Self  solifenacin (VESICARE) 5 MG tablet [id number] Yes Take 1 tablet by mouth daily. [provider] Taking Active   Tiotropium Bromide-Olodaterol (STIOLTO RESPIMAT) 2.5-2.5 MCG/ACT AERS [id number] Yes Inhale into the lungs. [provider] Taking Active   umeclidinium-vilanterol [facility] ELLIPTA) 62.5-25 MCG/INH AEPB [id number] Yes Inhale into the lungs. [provider] Taking Active   Wheat  Dextrin (BENEFIBER) POWD [id number] No Take 6 g by mouth 3 (three) times daily before meals. (2 tsp = 6 g)  Patient not taking: Reported on 01/27/2021   Milinda Pointer, MD Not Taking Expired 07/31/20 2359            Med Note (GARNER, Marsh Dolly Dec 19, 2019  8:35 AM) PRN  Med List Note Janett Billow, RN 11/24/20 1315): MR 12/24/20 UDS 11/24/20            Patient Active Problem List   Diagnosis Date Noted   Physical tolerance to opiate drug 01/28/2021   Chronic use of opiate for therapeutic purpose 11/24/2020   Atherosclerosis of native arteries of the extremities with ulceration ([place]) 50/53/9767   Uncomplicated opioid dependence ([place]) 10/20/2020   Encounter for chronic pain management 10/02/2020   Encounter for pain management counseling 10/02/2020   Overdose opiate, accidental or unintentional, sequela 10/02/2020   Chronic kidney disease 10/02/2020   Chronic sacroiliac pain (Bilateral) 09/30/2020   Osteoarthritis of sacroiliac joints (Bilateral) ([place]) 09/30/2020   Chronic low back pain (Bilateral) w/o sciatica 09/30/2020  Pneumonia 08/03/2020   Prolonged QT interval 08/02/2020   Venous insufficiency of both lower extremities 07/04/2020   COVID-19 06/11/2020   Varicose veins of left lower extremity with inflammation, with ulcer of ankle limited to breakdown of skin ([place]) 04/01/2020   Opioid dependence with opioid-induced disorder ([place]) 02/14/2020   Abnormal MRI, lumbar spine (11/03/2019) 01/23/2020   Pharmacologic therapy 12/20/2019   Other specified dorsopathies, sacral and sacrococcygeal region 06/12/2019   Chronic hip pain (Left) 06/12/2019   Osteoarthritis of hip (Left) 06/12/2019   Greater trochanteric bursitis of hip (Left) 03/12/2019   Preop testing 01/22/2019   Cervicalgia 10/03/2018   Chronic shoulders pain (Bilateral) (L>R) 05/29/2018   Osteoarthritis of shoulder (Bilateral) 05/29/2018   Osteoarthritis of hip (Bilateral) 05/29/2018   Chronic  shoulder pain after shoulder replacement (Bilateral) (L>R) 05/29/2018   Trigger finger, left middle finger 05/29/2018   Spondylosis without myelopathy or radiculopathy, lumbosacral region 02/09/2018   Insomnia 02/01/2018   Numbness and tingling of upper extremity (Bilateral) 09/07/2017   Chronic shoulder pain, Radicular (Bilateral) (L>R) 09/07/2017   Cervical facet syndrome (Bilateral) (L>R) 09/07/2017   Cervical (3-4 mm) Anterolisthesis of C4 on C5 09/07/2017   Cervical foraminal stenosis (Bilateral) 09/07/2017   Chronic lower extremity pain (2ry area of Pain) (Bilateral) (L>R) 05/13/2017   DDD (degenerative disc disease), cervical 05/13/2017   Chronic cervical radicular pain (3ry area of Pain) (Bilateral) (L>R) 05/04/2017   Chronic anticoagulation (Eliquis) 05/04/2017   Chronic hip pain (Bilateral) 04/20/2017   Chronic neck pain 03/01/2017   Hyperlipidemia 02/02/2017   Red blood cell antibody positive 11/18/2016   Anticoagulated 11/17/2016   Atrial fibrillation and flutter ([place]) 11/17/2016   Osteoarthritis involving multiple joints 06/14/2016   Cardiac pacemaker in situ 01/07/2016   S/P cardiac pacemaker procedure 01/07/2016   GERD (gastroesophageal reflux disease) 12/18/2015   Chronic pain syndrome 06/25/2015   Chronic low back pain (1ry area of Pain) (Bilateral) (L>R) w/ sciatica (Bilateral) 06/25/2015   Lumbar facet syndrome (Bilateral) (L>R) 06/25/2015   Lumbar spondylosis 06/25/2015   Chronic lumbar radicular pain (Bilateral) (L>R) 06/25/2015   Restless leg syndrome 06/25/2015   Myofascial pain 06/25/2015   Neurogenic pain 06/25/2015   Long term current use of opiate analgesic 06/25/2015   Long term prescription opiate use 06/25/2015   Opiate use (90 MME/Day) 06/25/2015   Opioid-induced constipation (OIC) 06/25/2015   Vitamin D insufficiency 06/25/2015   Grade 1 Retrolisthesis of L1 over L2 06/25/2015   Lumbar facet hypertrophy (L1-2, L2-3, and L4-5) 06/25/2015   Lumbar  lateral recess stenosis (L1-2 and L4-5) 06/25/2015   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) 06/25/2015   Epidural fibrosis 06/25/2015   Lumbar foraminal stenosis (Severe Left L4-5) 06/25/2015   Osteoporosis 06/25/2015   Chronic sacroiliac joint pain (Left) 06/25/2015   History of methicillin resistant staphylococcus aureus (MRSA) 06/25/2015   Mitral valve prolapse 06/25/2015   Seasonal allergies 06/17/2015   History of breast cancer 06/17/2015   Chronic diastolic CHF (congestive heart failure) ([place]) 12/26/2014   Centrilobular emphysema ([place]) 09/16/2014   S/P cardiac catheterization 04/04/2014   DDD (degenerative disc disease), lumbosacral 04/03/2014   Benign hypertension with CKD (chronic kidney disease) stage III ([place]) 04/03/2014   Lumbar central spinal stenosis 04/03/2014   Chronic kidney disease (CKD), stage III (moderate) ([place]) 10/30/2013   Gout 10/30/2013    Immunization History  Administered Date(s) Administered   Fluad Quad(high Dose 65+) 05/08/2019, 05/25/2020   Influenza, High Dose Seasonal PF 06/17/2015, 06/22/2016, 04/19/2017, 06/02/2018  Moderna Sars-Covid-2 Vaccination 09/07/2019, 10/05/2019, 09/02/2020   Pneumococcal Conjugate-13 07/30/2014   Pneumococcal Polysaccharide-23 08/23/2008, 01/18/2018   Tdap 07/30/2014    Conditions to be addressed/monitored: COPD, atrial fibrillation  Care Plan : PharmD - Medication Assistance  Updates made by Vella Raring, RPH-CPP since 02/02/2021 12:00 AM     Problem: Disease Progression      Long-Range Goal: Disease Progression Prevented or Minimized Completed 02/02/2021  Start Date: 02/02/2021  Expected End Date: 05/03/2021  This Visit's Progress: On track  Priority: High  Note:   Current Barriers:  Financial Barriers; patient has [name] and previously reported Eliquis to be cost prohibitive Note CCM Pharmacy assisted patient with applying for and receiving  medication assistance from Eliquis in 2020 & 2021 calendar years Per pharmacy, patient now has Extra Help subsidy in 2022   Pharmacist Clinical Goal(s):  Over the next 90 days, patient will verbalize ability to afford treatment regimen through collaboration with PharmD and provider.    Interventions: 1:1 collaboration with Olin Hauser, DO regarding development and update of comprehensive plan of care as evidenced by provider attestation and co-signature Inter-disciplinary care team collaboration (see longitudinal plan of care) Perform chart review Patient seen for Office Visit with Wound Care on 6/7 Office Visit with [facility] Pulmonology on 6/2 Patient declines to complete medication review with me today Confirms uses maintenance inhaler, Stiolto, 2 puffs daily and rescue inhaler, albuterol, as needed as directed.   Medication Assistance: Today patient reports she has ~4 month supply of Eliquis remaining. Patient unsure of how much she has spent out of pocket for medications this calendar year Place call to [facility]. Per pharmacy, patient now enrolled in Extra Help as copayments ranging from $0-$9.85. Follow up with patient. She denies further medication concerns at this time   Patient Goals/Self-Care Activities Over the next 90 days, patient will:  - take medications as prescribed  Follow Up Plan:  Patient declines further CM Pharmacy needs at this time The patient has been provided with contact information for CM Pharmacist and has been advised to call with any medication related questions or concerns.        Patient's preferred pharmacy is:  [place], [place] - [address] [place] [address] Phone: [phone] Fax: [phone]   Follow Up:  Patient requests no follow-up at this time.  Harlow Asa, PharmD, [place], CPP Clinical Pharmacist [facility] [phone]

## 2021-02-03 ENCOUNTER — Encounter: Payer: Medicare HMO | Admitting: Physician Assistant

## 2021-02-05 ENCOUNTER — Encounter (INDEPENDENT_AMBULATORY_CARE_PROVIDER_SITE_OTHER): Payer: Medicare HMO

## 2021-02-05 ENCOUNTER — Ambulatory Visit (INDEPENDENT_AMBULATORY_CARE_PROVIDER_SITE_OTHER): Payer: Medicare HMO | Admitting: Nurse Practitioner

## 2021-02-08 DIAGNOSIS — J45998 Other asthma: Secondary | ICD-10-CM | POA: Diagnosis not present

## 2021-02-09 ENCOUNTER — Encounter: Payer: Self-pay | Admitting: Podiatry

## 2021-02-09 ENCOUNTER — Ambulatory Visit: Payer: Medicare HMO | Admitting: Podiatry

## 2021-02-09 ENCOUNTER — Other Ambulatory Visit: Payer: Self-pay

## 2021-02-09 DIAGNOSIS — M79676 Pain in unspecified toe(s): Secondary | ICD-10-CM | POA: Diagnosis not present

## 2021-02-09 DIAGNOSIS — B351 Tinea unguium: Secondary | ICD-10-CM | POA: Diagnosis not present

## 2021-02-09 DIAGNOSIS — D689 Coagulation defect, unspecified: Secondary | ICD-10-CM | POA: Diagnosis not present

## 2021-02-09 NOTE — Progress Notes (Signed)
Subjective:  [name], female    DOB: 1930-04-13,  MRN: [id number] HPI Chief Complaint  Patient presents with   Toe Pain    3rd toe left - wound base of toenail x few months, Dr. Elvina Mattes was treating, wound lateral ankle left that the wound center has been treating, but currently healed up    Debridement    Trim toenails    New Patient (Initial Visit)    87 y.o. female presents with the above complaint.   ROS: Denies fever chills nausea vomiting muscle aches pains calf pain back pain chest pain shortness of breath.  Past Medical History:  Diagnosis Date   Acute postoperative pain 02/09/2018   Arrhythmia, sinus node 04/03/2014   Arthritis    Arthritis    Atrial fibrillation (HCC)    Back pain    lower back chronic   Bradycardia    Breast cancer ([place]) 2004   right breast cancer   Cancer ([place]) 2004   rt breast cancer-post lumpectomy- chemo/rad   CHF (congestive heart failure) (HCC)    Chronic back pain    CKD (chronic kidney disease)    COPD (chronic obstructive pulmonary disease) ([place])    wears O2 at 2L via [place] at night   Cystocele    Decubitus ulcers    Dehydration    Gout    Hematuria    Hepatitis C    Hyperlipidemia    Hypertension    Hypomagnesemia 06/25/2015   OAB (overactive bladder)    Overactive bladder    Restless leg    Shoulder pain, bilateral    Urinary frequency    UTI (lower urinary tract infection)    Vaginal atrophy    Past Surgical History:  Procedure Laterality Date   ABDOMINAL HYSTERECTOMY     BACK SURGERY     BREAST LUMPECTOMY Right 2004   BREAST SURGERY     rt lumpectomy   CARPAL TUNNEL RELEASE     CATARACT EXTRACTION W/PHACO  10/19/2011   Procedure: CATARACT EXTRACTION PHACO AND INTRAOCULAR LENS PLACEMENT ([place]);  Surgeon: Elta Guadeloupe T. Gershon Crane, MD;  Location: [facility];  Service: Ophthalmology;  Laterality: Right;  CDE:10.81   CATARACT EXTRACTION W/PHACO  11/02/2011   Procedure: CATARACT EXTRACTION PHACO AND INTRAOCULAR LENS  PLACEMENT (I[facility]);  Surgeon: Elta Guadeloupe T. Gershon Crane, MD;  Location: [facility];  Service: Ophthalmology;  Laterality: Left;  CDE 8.60   CHOLECYSTECTOMY     3/18   COLON SURGERY     INSERT / REPLACE / REMOVE PACEMAKER     JOINT REPLACEMENT     bilateral TKA   LOWER EXTREMITY ANGIOGRAPHY Left 10/14/2020   Procedure: LOWER EXTREMITY ANGIOGRAPHY;  Surgeon: Katha Cabal, MD;  Location: [place] CV LAB;  Service: Cardiovascular;  Laterality: Left;   PACEMAKER INSERTION N/A 12/24/2015   Procedure: INSERTION PACEMAKER;  Surgeon: Isaias Cowman, MD;  Location: [facility] ORS;  Service: Cardiovascular;  Laterality: N/A;   ROTATOR CUFF REPAIR     bilateral    Current Outpatient Medications:    acetaminophen (TYLENOL) 500 MG tablet, Take 500 mg by mouth in the morning and at bedtime., Disp: , Rfl:    albuterol (PROVENTIL) (2.5 MG/3ML) 0.083% nebulizer solution, Take 3 mLs (2.5 mg total) by nebulization every 8 (eight) hours as needed for wheezing or shortness of breath., Disp: 75 mL, Rfl: 3   apixaban (ELIQUIS) 5 MG TABS tablet, Take 1 tablet (5 mg total) by mouth 2 (two) times daily., Disp: 60 tablet,  Rfl: 0   aspirin EC 81 MG tablet, Take 1 tablet (81 mg total) by mouth daily. Swallow whole., Disp: 150 tablet, Rfl: 2   atorvastatin (LIPITOR) 20 MG tablet, Take 1 tablet (20 mg total) by mouth daily., Disp: 30 tablet, Rfl: 3   bisacodyl (DULCOLAX) 5 MG EC tablet, Take 2 tablets (10 mg total) by mouth daily as needed for moderate constipation., Disp: 180 tablet, Rfl: 1   Cholecalciferol (VITAMIN D) 2000 units tablet, Take 2,000 Units by mouth daily. , Disp: , Rfl:    dextromethorphan-guaiFENesin (MUCINEX DM) 30-600 MG 12hr tablet, Take 1 tablet by mouth 2 (two) times daily., Disp: , Rfl:    furosemide (LASIX) 40 MG tablet, TAKE (1) TABLET BY MOUTH ONCE DAILY AS NEEDED. (Patient taking differently: Take 20 mg by mouth daily.), Disp: 30 tablet, Rfl: 11   levocetirizine (XYZAL) 5 MG tablet, Take 0.5 tablets (2.5  mg total) by mouth every other day., Disp: 23 tablet, Rfl: 4   lidocaine (XYLOCAINE) 5 % ointment, Apply 1 application topically daily as needed., Disp: 35.44 g, Rfl: 0   lisinopril (ZESTRIL) 10 MG tablet, Take by mouth., Disp: , Rfl:    Melatonin 10 MG TABS, Take 10 mg by mouth at bedtime., Disp: , Rfl:    Oxycodone HCl 10 MG TABS, Take 1 tablet (10 mg total) by mouth 2 (two) times daily as needed. Must last 30 days., Disp: 60 tablet, Rfl: 0   OXYGEN, Inhale 2 L into the lungs at bedtime. , Disp: , Rfl:    rOPINIRole (REQUIP) 2 MG tablet, TAKE 2 TABLETS BY MOUTH AT BEDTIME, Disp: 180 tablet, Rfl: 0   solifenacin (VESICARE) 5 MG tablet, Take 1 tablet by mouth daily., Disp: , Rfl:    Tiotropium Bromide-Olodaterol (STIOLTO RESPIMAT) 2.5-2.5 MCG/ACT AERS, Inhale into the lungs., Disp: , Rfl:    umeclidinium-vilanterol (ANORO ELLIPTA) 62.5-25 MCG/INH AEPB, Inhale into the lungs., Disp: , Rfl:    Wheat Dextrin (BENEFIBER) POWD, Take 6 g by mouth 3 (three) times daily before meals. (2 tsp = 6 g) (Patient not taking: Reported on 01/27/2021), Disp: 730 g, Rfl: 8  Allergies  Allergen Reactions   Flexeril [Cyclobenzaprine Hcl]     Severe Rash   Vancomycin Rash    Red [place] Syndrome   Review of Systems Objective:  There were no vitals filed for this visit.  General: Well developed, nourished, in no acute distress, alert and oriented x3   Dermatological: Skin is warm, dry and supple bilateral. Nails x 10 are well maintained; remaining integument appears unremarkable at this time. There are no open sores, no preulcerative lesions, no rash or signs of infection present.  Lesion to the left ankle being taken care of by the wound clinic demonstrates scabbed and crusted area with no ulceration at this time.  Toes do not demonstrate any ulcerations or open wounds at this time.  Vascular: Dorsalis Pedis artery and Posterior Tibial artery pedal pulses are 1/4 bilateral with immedate capillary fill time. Pedal  hair growth present. No varicosities and no lower extremity edema present bilateral.   Neruologic: Grossly intact via light touch bilateral. Vibratory intact via tuning fork bilateral. Protective threshold with Semmes Wienstein monofilament intact to all pedal sites bilateral. Patellar and Achilles deep tendon reflexes 2+ bilateral. No Babinski or clonus noted bilateral.   Musculoskeletal: No gross boney pedal deformities bilateral. No pain, crepitus, or limitation noted with foot and ankle range of motion bilateral. Muscular strength 5/5 in all groups tested  bilateral.  Gait: Unassisted, Nonantalgic.    Radiographs:  None taken  Assessment & Plan:   Assessment: Painful nails.  Pain in limb secondary onychomycosis 1 through 5 bilateral.  Plan: Debridement of all reactive hyperkeratotic tissue debridement of toenails 1 through 5 left.     Dajuana Palen T. [place], [place]

## 2021-02-10 DIAGNOSIS — J45998 Other asthma: Secondary | ICD-10-CM | POA: Diagnosis not present

## 2021-02-13 ENCOUNTER — Other Ambulatory Visit (INDEPENDENT_AMBULATORY_CARE_PROVIDER_SITE_OTHER): Payer: Self-pay | Admitting: Vascular Surgery

## 2021-02-13 ENCOUNTER — Other Ambulatory Visit: Payer: Self-pay | Admitting: Family Medicine

## 2021-02-13 DIAGNOSIS — J302 Other seasonal allergic rhinitis: Secondary | ICD-10-CM

## 2021-02-13 NOTE — Telephone Encounter (Signed)
  Notes to clinic: review qty for refill Only 8 tabs    Requested Prescriptions  Pending Prescriptions Disp Refills   levocetirizine (XYZAL) 5 MG tablet [Pharmacy Med Name: LEVOCETIRIZINE 5 MG TABLET] 8 tablet 0    Sig: TAKE (1/2) TABLET BY MOUTH EVERY OTHER DAY.      Ear, Nose, and Throat:  Antihistamines Passed - 02/13/2021 10:46 AM      Passed - Valid encounter within last 12 months    Recent Outpatient Visits           3 months ago Chronic respiratory failure with hypoxia [facility])   [place], DO   4 months ago Benign hypertension with CKD (chronic kidney disease) stage III [facility])   [facility], Devonne Doughty, DO   6 months ago Community acquired pneumonia of right lower lobe of lung   [place], DO   6 months ago Bilateral lower extremity edema   [facility] [place], Devonne Doughty, DO   7 months ago Varicose veins of left lower extremity with inflammation, with ulcer of ankle limited to breakdown of skin [facility])   [facility] Olin Hauser, DO       Future Appointments             In 1 month [place], Devonne Doughty, [facility], [facility]

## 2021-02-17 ENCOUNTER — Other Ambulatory Visit (INDEPENDENT_AMBULATORY_CARE_PROVIDER_SITE_OTHER): Payer: Self-pay | Admitting: Vascular Surgery

## 2021-02-17 ENCOUNTER — Other Ambulatory Visit: Payer: Self-pay | Admitting: Family Medicine

## 2021-02-17 NOTE — Telephone Encounter (Signed)
   Notes to clinic:  this medication was filled by a different provider  Review for refill    Requested Prescriptions  Pending Prescriptions Disp Refills   atorvastatin (LIPITOR) 20 MG tablet [Pharmacy Med Name: ATORVASTATIN 20 MG TABLET] 30 tablet 11    Sig: TAKE 1 TABLET BY MOUTH ONCE DAILY FOR CHOLESTEROL      Cardiovascular:  Antilipid - Statins Failed - 02/17/2021  2:43 PM      Failed - LDL in normal range and within 360 days    LDL Cholesterol (Calc)  Date Value Ref Range Status  04/01/2020 117 (H) mg/dL (calc) Final    Comment:    Reference range: <100 . Desirable range <100 mg/dL for primary prevention;   <70 mg/dL for patients with CHD or diabetic patients  with > or = 2 CHD risk factors. Marland Kitchen LDL-C is now calculated using the Martin-Hopkins  calculation, which is a validated novel method providing  better accuracy than the Friedewald equation in the  estimation of LDL-C.  Cresenciano Genre et al. Annamaria Helling. 3817;711(65): 2061-2068  (http://education.[facility].com/faq/FAQ164)           Passed - Total Cholesterol in normal range and within 360 days    Cholesterol  Date Value Ref Range Status  04/01/2020 199 <200 mg/dL Final          Passed - HDL in normal range and within 360 days    HDL  Date Value Ref Range Status  04/01/2020 62 > OR = 50 mg/dL Final          Passed - Triglycerides in normal range and within 360 days    Triglycerides  Date Value Ref Range Status  04/01/2020 92 <150 mg/dL Final          Passed - Patient is not pregnant      Passed - Valid encounter within last 12 months    Recent Outpatient Visits           3 months ago Chronic respiratory failure with hypoxia [facility])   [facility], Devonne Doughty, DO   4 months ago Benign hypertension with CKD (chronic kidney disease) stage III [facility])   [facility] [place], Devonne Doughty, DO   6 months ago Community acquired pneumonia of right lower lobe of lung    [place], DO   6 months ago Bilateral lower extremity edema   [facility] [place], Devonne Doughty, DO   7 months ago Varicose veins of left lower extremity with inflammation, with ulcer of ankle limited to breakdown of skin [facility])   [facility] [place], Devonne Doughty, DO       Future Appointments             In 1 month Parks Ranger, [facility], [facility]

## 2021-02-26 ENCOUNTER — Encounter: Payer: Medicare HMO | Attending: Physician Assistant | Admitting: Physician Assistant

## 2021-02-26 ENCOUNTER — Other Ambulatory Visit: Payer: Self-pay

## 2021-02-26 ENCOUNTER — Telehealth: Payer: Self-pay

## 2021-02-26 DIAGNOSIS — I480 Paroxysmal atrial fibrillation: Secondary | ICD-10-CM | POA: Insufficient documentation

## 2021-02-26 DIAGNOSIS — L97522 Non-pressure chronic ulcer of other part of left foot with fat layer exposed: Secondary | ICD-10-CM | POA: Diagnosis not present

## 2021-02-26 DIAGNOSIS — I872 Venous insufficiency (chronic) (peripheral): Secondary | ICD-10-CM | POA: Insufficient documentation

## 2021-02-26 DIAGNOSIS — I509 Heart failure, unspecified: Secondary | ICD-10-CM | POA: Insufficient documentation

## 2021-02-26 DIAGNOSIS — G9009 Other idiopathic peripheral autonomic neuropathy: Secondary | ICD-10-CM | POA: Diagnosis not present

## 2021-02-26 DIAGNOSIS — N189 Chronic kidney disease, unspecified: Secondary | ICD-10-CM | POA: Insufficient documentation

## 2021-02-26 DIAGNOSIS — I130 Hypertensive heart and chronic kidney disease with heart failure and stage 1 through stage 4 chronic kidney disease, or unspecified chronic kidney disease: Secondary | ICD-10-CM | POA: Diagnosis not present

## 2021-02-26 DIAGNOSIS — L97322 Non-pressure chronic ulcer of left ankle with fat layer exposed: Secondary | ICD-10-CM | POA: Diagnosis not present

## 2021-02-26 DIAGNOSIS — Z7901 Long term (current) use of anticoagulants: Secondary | ICD-10-CM | POA: Insufficient documentation

## 2021-02-26 NOTE — Progress Notes (Addendum)
Taha, Rachna L. ([id number]) Visit Report for 02/26/2021 Chief Complaint Document Details Patient Name: Kristin [name], [name] Date of Service: 02/26/2021 10:30 AM Medical Record Number: [id number] Patient Account Number: [id number] Date of Birth/Sex: July 13, 1930 (87 y.o. F) Treating RN: Dolan Amen Primary Care Provider: [name] Other Clinician: Referring Provider: [name] Treating Provider/Extender: [name] in Treatment: 7 Information Obtained from: Patient Chief Complaint Left ankle and left foot ulcers Electronic Signature(s) Signed: 02/26/2021 10:23:55 AM By: Worthy Keeler PA-C Entered By: Worthy Keeler on 02/26/2021 10:23:55 Kristin [name], [name] ([id number]) -------------------------------------------------------------------------------- Debridement Details Patient Name: Kristin [name], [name] Date of Service: 02/26/2021 10:30 AM Medical Record Number: [id number] Patient Account Number: [id number] Date of Birth/Sex: 12-Feb-1930 (87 y.o. F) Treating RN: Donnamarie Poag Primary Care Provider: [name] Other Clinician: Referring Provider: [name] Treating Provider/Extender: [name] in Treatment: 7 Debridement Performed for Wound #1R Left Malleolus Assessment: Performed By: Physician Tommie Sams., PA-C Debridement Type: Debridement Level of Consciousness (Pre- Awake and Alert procedure): Pre-procedure Verification/Time Out Yes - 10:50 Taken: Start Time: 10:50 Total Area Debrided (L x W): 2.5 (cm) x 2.3 (cm) = 5.75 (cm) Tissue and other material Viable, Non-Viable, Eschar, Slough, Subcutaneous, Slough debrided: Level: Skin/Subcutaneous Tissue Debridement Description: Excisional Instrument: Curette Bleeding: Minimum Hemostasis Achieved: Pressure End Time: 10:52 Response to Treatment: Procedure was tolerated well Level of Consciousness (Post- Awake and Alert procedure): Post Debridement Measurements of Total  Wound Length: (cm) 2.5 Width: (cm) 2.3 Depth: (cm) 0.1 Volume: (cm) 0.452 Character of Wound/Ulcer Post Debridement: Improved Post Procedure Diagnosis Same as Pre-procedure Electronic Signature(s) Signed: 02/26/2021 12:42:22 PM By: Donnamarie Poag Signed: 02/26/2021 5:08:48 PM By: Worthy Keeler PA-C Entered By: Donnamarie Poag on 02/26/2021 10:52:15 Kristin [name], [name] ([id number]) -------------------------------------------------------------------------------- HPI Details Patient Name: Kristin [name], [name] Date of Service: 02/26/2021 10:30 AM Medical Record Number: [id number] Patient Account Number: [id number] Date of Birth/Sex: 01-20-1930 (87 y.o. F) Treating RN: Dolan Amen Primary Care Provider: [name] Other Clinician: Referring Provider: [name] Treating Provider/Extender: [name] in Treatment: 7 History of Present Illness HPI Description: Left 0.81 Right 0.83 12/25/20 About a year but patient is not sure how long for sure 01/06/2021 upon evaluation today patient presents for initial inspection here in our clinic concerning issues she has been having in 2 areas one being on her left lateral malleolus. This is the most significant of the 2 regions based on what I am seeing today. The other is actually over her left dorsal 3rd toe. With that being said the patient tells me that currently she has been tolerating the dressing changes without complication. There does not appear to be any signs of obvious infection at this time but nonetheless she does not seem to be doing a lot better. She is mainly just been putting a cream on the areas but she is not sure what cream this was when I questioned her about that today she states "it may be on my med list" although I did not see that. The patient is really not a very good historian in general concerning how long this has been going on and what exactly is going on. The patient does have a history of neuropathy, chronic  kidney disease, atrial fibrillation and is on long-term anticoagulant therapy as well as aspirin. This includes Eliquis. 5/24; patient has an open area on the left lateral malleolus as well as an area on the base of her third toe which is mycotic also on the left. We  have been using Hydrofera Blue as of last week. Biopsy that was done showed chronic stasis dermatitis with areas of healing erosion and overlying inflammatory crust. Negative for malignancy and negative for fungal organisms. X-ray of the foot did not show anything abnormal except degenerative changes 6/1; left lateral malleolus wound had a lot of callus and dry skin on the surface. I remove this there is only 2 small open areas. The base of the left third toe at the base of the mycotic nail also required debridement we have been using silver alginate in both areas 01/27/2021 upon evaluation today patient appears to be doing decently well in regard to her wounds. In fact she appears to be healed based on what I am seeing at this point. I do not see any signs of active infection at this time. Readmission: 02/26/2021 upon evaluation today patient unfortunately has a reopening of her wound on her leg. She has on the left lateral ankle noted wound has been open for about a week. She tells me that she has not been wearing compression since we discharged her labs under the impression that she had compression stockings and would be wearing those. Nonetheless it does not appear to be the case. I do believe this may be very likely why this reopen. Fortunately there does not appear to be any signs of active infection at this time. No fevers, chills, nausea, vomiting, or diarrhea. Electronic Signature(s) Signed: 02/26/2021 11:18:46 AM By: Worthy Keeler PA-C Entered By: Worthy Keeler on 02/26/2021 11:18:46 Kristin [name], [name] ([id number]) -------------------------------------------------------------------------------- Physical Exam Details Patient  Name: Kristin [name], [name] Date of Service: 02/26/2021 10:30 AM Medical Record Number: [id number] Patient Account Number: [id number] Date of Birth/Sex: [birth year] (87 y.o. F) Treating RN: Dolan Amen Primary Care Provider: [name] Other Clinician: Referring Provider: [name] Treating Provider/Extender: [name] in Treatment: 7 Constitutional Well-nourished and well-hydrated in no acute distress. Respiratory normal breathing without difficulty. Psychiatric this patient is able to make decisions and demonstrates good insight into disease process. Alert and Oriented x 3. pleasant and cooperative. Notes Patient's wound bed showed signs of good granulation epithelization at this time. Fortunately there does not appear to be any evidence of infection though she did have a significant amount of thickened tissue around the edges of the wound that did require sharp debridement today. I do not see anything that seems to be more untoward. I did previously biopsied this with no signs of malignancy. Nonetheless still she continues to develop a lot of thickened and inflamed tissue around the wound which is what the biopsy showed previously as well. Electronic Signature(s) Signed: 02/26/2021 11:19:18 AM By: Worthy Keeler PA-C Entered By: Worthy Keeler on 02/26/2021 11:19:17 Kristin [name], [name] ([id number]) -------------------------------------------------------------------------------- Physician Orders Details Patient Name: Kristin [name], [name] Date of Service: 02/26/2021 10:30 AM Medical Record Number: [id number] Patient Account Number: [id number] Date of Birth/Sex: 09-30-1929 (87 y.o. F) Treating RN: Donnamarie Poag Primary Care Provider: [name] Other Clinician: Referring Provider: [name] Treating Provider/Extender: [name] in Treatment: 7 Verbal / Phone Orders: No Diagnosis Coding ICD-10 Coding Code Description G90.09 Other  idiopathic peripheral autonomic neuropathy L97.322 Non-pressure chronic ulcer of left ankle with fat layer exposed L97.522 Non-pressure chronic ulcer of other part of left foot with fat layer exposed N18.9 Chronic kidney disease, unspecified I48.0 Paroxysmal atrial fibrillation Z79.01 Long term (current) use of anticoagulants Follow-up Appointments o Return Appointment in 1 week. Bathing/ Shower/ Hygiene o May shower with wound  dressing protected with water repellent cover or cast protector. o No tub bath. Edema Control - Lymphedema / Segmental Compressive Device / Other Left Lower Extremity o Optional: One layer of unna paste to top of compression wrap (to act as an anchor). o Elevate, Exercise Daily and Avoid Standing for Long Periods of Time. o Elevate legs to the level of the heart and pump ankles as often as possible o Elevate leg(s) parallel to the floor when sitting. Wound Treatment Wound #1R - Malleolus Wound Laterality: Left Cleanser: Soap and Water 1 x Per Week/30 Days Discharge Instructions: Gently cleanse wound with antibacterial soap, rinse and pat dry prior to dressing wounds Primary Dressing: Hydrofera Blue Ready Transfer Foam, 2.5x2.5 (in/in) 1 x Per Week/30 Days Discharge Instructions: Apply Hydrofera Blue Ready to wound bed as directed Secondary Dressing: ABD Pad 5x9 (in/in) 1 x Per Week/30 Days Discharge Instructions: Cover with ABD pad Compression Wrap: Profore Lite LF 3 Multilayer Compression Bandaging System (Generic) 1 x Per Week/30 Days Discharge Instructions: Apply 3 multi-layer wrap as prescribed. Notes Bring your compression stockings next time to show [name] what you have Electronic Signature(s) Signed: 02/26/2021 12:42:22 PM By: Donnamarie Poag Signed: 02/26/2021 5:08:48 PM By: Worthy Keeler PA-C Entered By: Donnamarie Poag on 02/26/2021 10:56:58 Kristin [name], [name]  ([id number]) -------------------------------------------------------------------------------- Problem List Details Patient Name: Kristin [name], [name] Date of Service: 02/26/2021 10:30 AM Medical Record Number: [id number] Patient Account Number: [id number] Date of Birth/Sex: [birth year] (87 y.o. F) Treating RN: Dolan Amen Primary Care Provider: [name] Other Clinician: Referring Provider: [name] Treating Provider/Extender: [name] in Treatment: 7 Active Problems ICD-10 Encounter Code Description Active Date MDM Diagnosis G90.09 Other idiopathic peripheral autonomic neuropathy 01/06/2021 No Yes L97.322 Non-pressure chronic ulcer of left ankle with fat layer exposed 01/06/2021 No Yes L97.522 Non-pressure chronic ulcer of other part of left foot with fat layer 01/06/2021 No Yes exposed N18.9 Chronic kidney disease, unspecified 01/06/2021 No Yes I48.0 Paroxysmal atrial fibrillation 01/06/2021 No Yes Z79.01 Long term (current) use of anticoagulants 01/06/2021 No Yes Inactive Problems Resolved Problems Electronic Signature(s) Signed: 02/26/2021 10:23:49 AM By: Worthy Keeler PA-C Entered By: Worthy Keeler on 02/26/2021 10:23:49 Roane, Rylynn L. ([id number]) -------------------------------------------------------------------------------- Progress Note Details Patient Name: Kristin [name], [name] Date of Service: 02/26/2021 10:30 AM Medical Record Number: [id number] Patient Account Number: [id number] Date of Birth/Sex: 06-Jul-1930 (87 y.o. F) Treating RN: Dolan Amen Primary Care Provider: [name] Other Clinician: Referring Provider: [name] Treating Provider/Extender: [name] in Treatment: 7 Subjective Chief Complaint Information obtained from Patient Left ankle and left foot ulcers History of Present Illness (HPI) Left 0.81 Right 0.83 12/25/20 About a year but patient is not sure how long for sure 01/06/2021 upon  evaluation today patient presents for initial inspection here in our clinic concerning issues she has been having in 2 areas one being on her left lateral malleolus. This is the most significant of the 2 regions based on what I am seeing today. The other is actually over her left dorsal 3rd toe. With that being said the patient tells me that currently she has been tolerating the dressing changes without complication. There does not appear to be any signs of obvious infection at this time but nonetheless she does not seem to be doing a lot better. She is mainly just been putting a cream on the areas but she is not sure what cream this was when I questioned her about that today she states "it may be on  my med list" although I did not see that. The patient is really not a very good historian in general concerning how long this has been going on and what exactly is going on. The patient does have a history of neuropathy, chronic kidney disease, atrial fibrillation and is on long-term anticoagulant therapy as well as aspirin. This includes Eliquis. 5/24; patient has an open area on the left lateral malleolus as well as an area on the base of her third toe which is mycotic also on the left. We have been using Hydrofera Blue as of last week. Biopsy that was done showed chronic stasis dermatitis with areas of healing erosion and overlying inflammatory crust. Negative for malignancy and negative for fungal organisms. X-ray of the foot did not show anything abnormal except degenerative changes 6/1; left lateral malleolus wound had a lot of callus and dry skin on the surface. I remove this there is only 2 small open areas. The base of the left third toe at the base of the mycotic nail also required debridement we have been using silver alginate in both areas 01/27/2021 upon evaluation today patient appears to be doing decently well in regard to her wounds. In fact she appears to be healed based on what I am  seeing at this point. I do not see any signs of active infection at this time. Readmission: 02/26/2021 upon evaluation today patient unfortunately has a reopening of her wound on her leg. She has on the left lateral ankle noted wound has been open for about a week. She tells me that she has not been wearing compression since we discharged her labs under the impression that she had compression stockings and would be wearing those. Nonetheless it does not appear to be the case. I do believe this may be very likely why this reopen. Fortunately there does not appear to be any signs of active infection at this time. No fevers, chills, nausea, vomiting, or diarrhea. Objective Constitutional Well-nourished and well-hydrated in no acute distress. Vitals Time Taken: 10:31 AM, Height: 67 in, Weight: 147 lbs, BMI: 23, Temperature: 98.0 F, Pulse: 75 bpm, Respiratory Rate: 18 breaths/min, Blood Pressure: 159/82 mmHg. Respiratory normal breathing without difficulty. Psychiatric this patient is able to make decisions and demonstrates good insight into disease process. Alert and Oriented x 3. pleasant and cooperative. Kristin [name], [name] ([id number]) General Notes: Patient's wound bed showed signs of good granulation epithelization at this time. Fortunately there does not appear to be any evidence of infection though she did have a significant amount of thickened tissue around the edges of the wound that did require sharp debridement today. I do not see anything that seems to be more untoward. I did previously biopsied this with no signs of malignancy. Nonetheless still she continues to develop a lot of thickened and inflamed tissue around the wound which is what the biopsy showed previously as well. Integumentary (Hair, Skin) Wound #1R status is Open. Original cause of wound was Gradually Appeared. The date acquired was: 09/23/2020. The wound has been in treatment 7 weeks. The wound is located on the Left  Malleolus. The wound measures 1.4cm length x 0.5cm width x 0.1cm depth; 0.55cm^2 area and 0.055cm^3 volume. There is Fat Layer (Subcutaneous Tissue) exposed. There is no tunneling or undermining noted. There is a medium amount of serosanguineous drainage noted. There is medium (34-66%) red, pink granulation within the wound bed. There is a medium (34-66%) amount of necrotic tissue within the wound bed including Eschar. 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 There was a Excisional Skin/Subcutaneous Tissue Debridement with a total area of 5.75 sq cm performed by Tommie Sams., PA-C. With the following instrument(s): Curette to remove Viable and Non-Viable tissue/material. Material removed includes Eschar, Subcutaneous Tissue, and Slough. A time out was conducted at 10:50, prior to the start of the procedure. A Minimum amount of bleeding was controlled with Pressure. The procedure was tolerated well. Post Debridement Measurements: 2.5cm length x 2.3cm width x 0.1cm depth; 0.452cm^3 volume. Character of Wound/Ulcer Post Debridement is improved. Post procedure Diagnosis Wound #1R: Same as Pre-Procedure Plan Follow-up Appointments: Return Appointment in 1 week. Bathing/ Shower/ Hygiene: May shower with wound dressing protected with water repellent cover or cast protector. No tub bath. Edema Control - Lymphedema / Segmental Compressive Device / Other: Optional: One layer of unna paste to top of compression wrap (to act as an anchor). Elevate, Exercise Daily and Avoid Standing for  Long Periods of Time. Elevate legs to the level of the heart and pump ankles as often as possible Elevate leg(s) parallel to the floor when sitting. General Notes: Bring your compression stockings next time to show [name] what you have WOUND #1R: - Malleolus Wound Laterality: Left Cleanser: Soap and Water 1 x Per Week/30 Days Discharge Instructions: Gently cleanse wound with antibacterial soap, rinse and pat dry prior to dressing wounds Primary Dressing: Hydrofera Blue Ready Transfer Foam, 2.5x2.5 (in/in) 1 x Per Week/30 Days Discharge Instructions: Apply Hydrofera Blue Ready to wound bed as directed Secondary Dressing: ABD Pad 5x9 (in/in) 1 x Per Week/30 Days Discharge Instructions: Cover with ABD pad Compression Wrap: Profore Lite LF 3 Multilayer Compression Bandaging System (Generic) 1 x Per Week/30 Days Discharge Instructions: Apply 3 multi-layer wrap as prescribed. Lilley, Dulcinea L. ([id number]) 1. Would recommend currently that we go ahead and continue with the wound care measures as before and the patient is in agreement with the plan. This includes the use of a Hydrofera Blue dressing which I think will do a good job here. 2. I am also going to recommend an ABD pad to cover followed by 3 layer compression wrap. 3. I am also can recommend the patient should be elevating her legs much as possible we will also see about ordering a juxta lite compression though I would like for her swelling to come down a little bit before we actually initiate that order I am going to have her bring her compression socks next week to see what exactly she has. We will see patient back for reevaluation in 1 week here in the clinic. If anything worsens or changes patient will contact our office for additional recommendations. Electronic Signature(s) Signed: 02/26/2021 11:20:44 AM By: Worthy Keeler PA-C Entered By: Worthy Keeler on 02/26/2021 11:20:44 Tillery, Luxe L.  ([id number]) -------------------------------------------------------------------------------- SuperBill Details Patient Name: Holten, Shey L. Date of Service: 02/26/2021 Medical Record Number: [id number] Patient Account Number: [id number] Date of Birth/Sex: October 04, 1929 (87 y.o. F) Treating RN: Donnamarie Poag Primary Care Provider: [name] Other Clinician: Referring Provider: [name] Treating Provider/Extender: [name] in Treatment: 7 Diagnosis Coding ICD-10 Codes Code Description G90.09 Other idiopathic peripheral autonomic  neuropathy L97.322 Non-pressure chronic ulcer of left ankle with fat layer exposed L97.522 Non-pressure chronic ulcer of other part of left foot with fat layer exposed N18.9 Chronic kidney disease, unspecified I48.0 Paroxysmal atrial fibrillation Z79.01 Long term (current) use of anticoagulants Facility Procedures CPT4 Code: [id number] Description: 31740 - DEB SUBQ TISSUE 20 SQ CM/< Modifier: Quantity: 1 CPT4 Code: Description: ICD-10 Diagnosis Description Z92.780 Non-pressure chronic ulcer of left ankle with fat layer exposed Modifier: Quantity: Physician Procedures CPT4 Code: 0447158 Description: 06386 - WC PHYS LEVEL 4 - EST PT Modifier: 25 Quantity: 1 CPT4 Code: Description: ICD-10 Diagnosis Description G90.09 Other idiopathic peripheral autonomic neuropathy L97.322 Non-pressure chronic ulcer of left ankle with fat layer exposed L97.522 Non-pressure chronic ulcer of other part of left foot with fat layer exp  N18.9 Chronic kidney disease, unspecified Modifier: osed Quantity: CPT4 Code: [id number] Description: 14159 - WC PHYS SUBQ TISS 20 SQ CM Modifier: Quantity: 1 CPT4 Code: Description: ICD-10 Diagnosis Description R33.125 Non-pressure chronic ulcer of left ankle with fat layer exposed Modifier: Quantity: Electronic Signature(s) Signed: 02/26/2021 11:21:47 AM By: Worthy Keeler PA-C Entered By: Worthy Keeler on  02/26/2021 11:21:46

## 2021-02-26 NOTE — Progress Notes (Addendum)
[name], [name] ([id number]) Visit Report for 02/26/2021 Arrival Information Details Patient Name: Kristin [name], [name] Date of Service: 02/26/2021 10:30 AM Medical Record Number: [id number] Patient Account Number: [id number] Date of Birth/Sex: [birth year] (87 y.o. F) Treating RN: Donnamarie Poag Primary Care Kristin [name]: [name] Other Clinician: Referring Kristin [name]: [name] Treating Kristin [name]/Extender: [name] in Treatment: 7 Visit Information History Since Last Visit Pain Present Now: Yes Patient Arrived: Ambulatory Arrival Time: 10:28 Accompanied By: self Transfer Assistance: None Patient Identification Verified: Yes Secondary Verification Process Completed: Yes Patient Requires Transmission-Based No Precautions: Patient Has Alerts: Yes Patient Alerts: Patient on Blood Thinner ELIQUIS and ASPIRIN NOT diabetic ABI left 0.81 @vascular  ABI right 0.83 @vascular  Electronic Signature(s) Signed: 02/26/2021 12:42:22 PM By: Donnamarie Poag Entered ByDonnamarie Poag on 02/26/2021 10:27:33 [name], [name] ([id number]) -------------------------------------------------------------------------------- Clinic Level of Care Assessment Details Patient Name: [name], [name] Date of Service: 02/26/2021 10:30 AM Medical Record Number: [id number] Patient Account Number: [id number] Date of Birth/Sex: 10-19-1929 (87 y.o. F) Treating RN: Donnamarie Poag Primary Care Lakin Romer: [name] Other Clinician: Referring Aadhya Bustamante: [name] Treating Marlow Berenguer/Extender: [name] in Treatment: 7 Clinic Level of Care Assessment Items TOOL 1 Quantity Score []  - Use when EandM and Procedure is performed on INITIAL visit 0 ASSESSMENTS - Nursing Assessment / Reassessment []  - General Physical Exam (combine w/ comprehensive assessment (listed just below) when performed on new 0 pt. evals) []  - 0 Comprehensive Assessment (HX, ROS, Risk Assessments, Wounds Hx,  etc.) ASSESSMENTS - Wound and Skin Assessment / Reassessment []  - Dermatologic / Skin Assessment (not related to wound area) 0 ASSESSMENTS - Ostomy and/or Continence Assessment and Care []  - Incontinence Assessment and Management 0 []  - 0 Ostomy Care Assessment and Management (repouching, etc.) PROCESS - Coordination of Care []  - Simple Patient / Family Education for ongoing care 0 []  - 0 Complex (extensive) Patient / Family Education for ongoing care []  - 0 Staff obtains Programmer, systems, Records, Test Results / Process Orders []  - 0 Staff telephones HHA, Nursing Homes / Clarify orders / etc []  - 0 Routine Transfer to another Facility (non-emergent condition) []  - 0 Routine Hospital Admission (non-emergent condition) []  - 0 New Admissions / Biomedical engineer / Ordering NPWT, Apligraf, etc. []  - 0 Emergency Hospital Admission (emergent condition) PROCESS - Special Needs []  - Pediatric / Minor Patient Management 0 []  - 0 Isolation Patient Management []  - 0 Hearing / Language / Visual special needs []  - 0 Assessment of Community assistance (transportation, D/C planning, etc.) []  - 0 Additional assistance / Altered mentation []  - 0 Support Surface(s) Assessment (bed, cushion, seat, etc.) INTERVENTIONS - Miscellaneous []  - External ear exam 0 []  - 0 Patient Transfer (multiple staff / Civil Service fast streamer / Similar devices) []  - 0 Simple Staple / Suture removal (25 or less) []  - 0 Complex Staple / Suture removal (26 or more) []  - 0 Hypo/Hyperglycemic Management (do not check if billed separately) []  - 0 Ankle / Brachial Index (ABI) - do not check if billed separately Has the patient been seen at the hospital within the last three years: Yes Total Score: 0 Level Of Care: ____ Mcphee, Ambria L. ([id number]) Electronic Signature(s) Signed: 02/26/2021 12:42:22 PM By: Donnamarie Poag Entered ByDonnamarie Poag on 02/26/2021 10:57:51 Nikkel, Galena L.  ([id number]) -------------------------------------------------------------------------------- Encounter Discharge Information Details Patient Name: [name], [name] Date of Service: 02/26/2021 10:30 AM Medical Record Number: [id number] Patient Account Number: [id number] Date of Birth/Sex: Jul 25, 1930 (87 y.o. F) Treating RN: Lyndel Safe,  [name] Primary Care Allegra Cerniglia: [name] Other Clinician: Referring Windel Keziah: [name] Treating Zuriah Bordas/Extender: [name] in Treatment: 7 Encounter Discharge Information Items Post Procedure Vitals Discharge Condition: Stable Temperature (F): 98.0 Ambulatory Status: Cane Pulse (bpm): 75 Discharge Destination: Home Respiratory Rate (breaths/min): 18 Transportation: Private Auto Blood Pressure (mmHg): 159/82 Accompanied By: son Schedule Follow-up Appointment: Yes Clinical Summary of Care: Electronic Signature(s) Signed: 02/26/2021 12:42:22 PM By: Donnamarie Poag Entered ByDonnamarie Poag on 02/26/2021 11:05:16 Borjon, Betsi L. ([id number]) -------------------------------------------------------------------------------- Lower Extremity Assessment Details Patient Name: [name], [name] Date of Service: 02/26/2021 10:30 AM Medical Record Number: [id number] Patient Account Number: [id number] Date of Birth/Sex: October 27, 1929 (87 y.o. F) Treating RN: Donnamarie Poag Primary Care Bobby Ragan: [name] Other Clinician: Referring Hari Casaus: [name] Treating Roderick Calo/Extender: [name] in Treatment: 7 Edema Assessment Assessed: [Left: Yes] [Right: No] Edema: [Left: Ye] [Right: s] Calf Left: Right: Point of Measurement: 33 cm From Medial Instep 33 cm Ankle Left: Right: Point of Measurement: 9 cm From Medial Instep 23 cm Knee To Floor Left: Right: From Medial Instep 44 cm Vascular Assessment Pulses: Dorsalis Pedis Palpable: [Left:Yes] Electronic Signature(s) Signed: 02/26/2021 12:42:22 PM By: Donnamarie Poag Entered ByDonnamarie Poag on 02/26/2021 10:45:09 Kosel, Emerly L. ([id number]) -------------------------------------------------------------------------------- Multi Wound Chart Details Patient Name: [name], [name] Date of Service: 02/26/2021 10:30 AM Medical Record Number: [id number] Patient Account Number: [id number] Date of Birth/Sex: 1930-07-29 (87 y.o. F) Treating RN: Donnamarie Poag Primary Care Armonii Sieh: [name] Other Clinician: Referring Bula Cavalieri: [name] Treating Makai Dumond/Extender: [name] in Treatment: 7 Vital Signs Height(in): 37 Pulse(bpm): 16 Weight(lbs): 147 Blood Pressure(mmHg): 159/82 Body Mass Index(BMI): 23 Temperature(F): 98.0 Respiratory Rate(breaths/min): 18 Photos: [N/A:N/A] Wound Location: Left Malleolus N/A N/A Wounding Event: Gradually Appeared N/A N/A Primary Etiology: Neuropathic Ulcer-Non Diabetic N/A N/A Comorbid History: Cataracts, Chronic Obstructive N/A N/A Pulmonary Disease (COPD), Arrhythmia, Congestive Heart Failure, Coronary Artery Disease, Hypertension, Hepatitis C, History of pressure wounds, Gout, Osteoarthritis, Received Chemotherapy, Received Radiation Date Acquired: 09/23/2020 N/A N/A Weeks of Treatment: 7 N/A N/A Wound Status: Open N/A N/A Wound Recurrence: Yes N/A N/A Measurements L x W x D (cm) 1.4x0.5x0.2 N/A N/A Area (cm) : 0.55 N/A N/A Volume (cm) : 0.11 N/A N/A % Reduction in Area: -16.80% N/A N/A % Reduction in Volume: -134.00% N/A N/A Classification: Full Thickness Without Exposed N/A N/A Support Structures Exudate Amount: Medium N/A N/A Exudate Type: Serosanguineous N/A N/A Exudate Color: red, brown N/A N/A Granulation Amount: Medium (34-66%) N/A N/A Necrotic Amount: Medium (34-66%) N/A N/A Necrotic Tissue: Eschar, Adherent Slough N/A N/A Exposed Structures: Fat Layer (Subcutaneous Tissue): N/A N/A Yes Fascia: No Tendon: No Muscle: No Joint: No Bone: No Epithelialization:  Large (67-100%) N/A N/A Treatment Notes Electronic Signature(s) Hellinger, Special L. ([id number]) Signed: 02/26/2021 12:42:22 PM By: Donnamarie Poag Entered ByDonnamarie Poag on 02/26/2021 10:39:42 Onley, Jamar L. ([id number]) -------------------------------------------------------------------------------- [place] Details Patient Name: Ryback, Onelia L. Date of Service: 02/26/2021 10:30 AM Medical Record Number: [id number] Patient Account Number: [id number] Date of Birth/Sex: 07/02/1930 (87 y.o. F) Treating RN: Donnamarie Poag Primary Care Yania Bogie: [name] Other Clinician: Referring Jahkeem Kurka: [name] Treating Brittish Bolinger/Extender: [name] in Treatment: 7 Active Inactive Pressure Nursing Diagnoses: Knowledge deficit related to causes and risk factors for pressure ulcer development Knowledge deficit related to management of pressures ulcers Potential for impaired tissue integrity related to pressure, friction, moisture, and shear Goals: Patient will remain free from development of additional pressure ulcers Date Initiated: 02/26/2021 Target Resolution Date: 04/06/2021  Goal Status: Active Interventions: Assess: immobility, friction, shearing, incontinence upon admission and as needed Notes: Electronic Signature(s) Signed: 02/26/2021 12:42:22 PM By: Donnamarie Poag Entered ByDonnamarie Poag on 02/26/2021 10:39:09 Hensley, Alissah L. ([id number]) -------------------------------------------------------------------------------- Pain Assessment Details Patient Name: Mende, Dangela L. Date of Service: 02/26/2021 10:30 AM Medical Record Number: [id number] Patient Account Number: [id number] Date of Birth/Sex: [birth year] (87 y.o. F) Treating RN: Donnamarie Poag Primary Care Laisha Rau: [name] Other Clinician: Referring Davin Archuletta: [name] Treating Iziah Cates/Extender: [name] in Treatment: 7 Active Problems Location of Pain Severity and  Description of Pain Patient Has Paino Yes Site Locations Pain Location: Pain in Ulcers Rate the pain. Current Pain Level: 3 Pain Management and Medication Current Pain Management: Electronic Signature(s) Signed: 02/26/2021 12:42:22 PM By: Donnamarie Poag Entered By: Donnamarie Poag on 02/26/2021 10:32:39 Beeghly, Zuria L. ([id number]) -------------------------------------------------------------------------------- Patient/Caregiver Education Details Patient Name: Hoel, Kelaiah L. Date of Service: 02/26/2021 10:30 AM Medical Record Number: [id number] Patient Account Number: [id number] Date of Birth/Gender: 01-27-1930 (87 y.o. F) Treating RN: Donnamarie Poag Primary Care Physician: [name] Other Clinician: Referring Physician: [name] Treating Physician/Extender: [name] in Treatment: 7 Education Assessment Education Provided To: Patient Education Topics Provided Basic Hygiene: Methods: Explain/Verbal Responses: State content correctly Welcome To [facility] [place]: Methods: Explain/Verbal Responses: State content correctly Wound Debridement: Wound/Skin Impairment: Methods: Explain/Verbal Responses: State content correctly Electronic Signature(s) Signed: 02/26/2021 12:42:22 PM By: Donnamarie Poag Entered ByDonnamarie Poag on 02/26/2021 10:40:09 Petrey, Meda L. ([id number]) -------------------------------------------------------------------------------- Wound Assessment Details Patient Name: Ogan, Tyjae L. Date of Service: 02/26/2021 10:30 AM Medical Record Number: [id number] Patient Account Number: [id number] Date of Birth/Sex: [birth year] (87 y.o. F) Treating RN: Donnamarie Poag Primary Care Richie Bonanno: [name] Other Clinician: Referring Olander Friedl: [name] Treating Margaret Staggs/Extender: [name] in Treatment: 7 Wound Status Wound Number: 1R Primary Neuropathic Ulcer-Non Diabetic Etiology: Wound Location: Left Malleolus Wound  Open Wounding Event: Gradually Appeared Status: Date Acquired: 09/23/2020 Comorbid Cataracts, Chronic Obstructive Pulmonary Disease (COPD), Weeks Of Treatment: 7 History: Arrhythmia, Congestive Heart Failure, Coronary Artery Clustered Wound: No Disease, Hypertension, Hepatitis C, History of pressure wounds, Gout, Osteoarthritis, Received Chemotherapy, Received Radiation Photos Wound Measurements Length: (cm) 1.4 Width: (cm) 0.5 Depth: (cm) 0.1 Area: (cm) 0.55 Volume: (cm) 0.055 % Reduction in Area: -16.8% % Reduction in Volume: -17% Epithelialization: Large (67-100%) Tunneling: No Undermining: No Wound Description Classification: Full Thickness Without Exposed Support Structu Exudate Amount: Medium Exudate Type: Serosanguineous Exudate Color: red, brown res Foul Odor After Cleansing: No Slough/Fibrino Yes Wound Bed Granulation Amount: Medium (34-66%) Exposed Structure Granulation Quality: Red, Pink Fascia Exposed: No Necrotic Amount: Medium (34-66%) Fat Layer (Subcutaneous Tissue) Exposed: Yes Necrotic Quality: Eschar Tendon Exposed: No Muscle Exposed: No Joint Exposed: No Bone Exposed: No Treatment Notes Wound #1R (Malleolus) Wound Laterality: Left Cleanser Soap and Water Discharge Instruction: Gently cleanse wound with antibacterial soap, rinse and pat dry prior to dressing wounds Soper, Jakari L. ([id number]) Peri-Wound Care Topical Primary Dressing Hydrofera Blue Ready Transfer Foam, 2.5x2.5 (in/in) Discharge Instruction: Apply Hydrofera Blue Ready to wound bed as directed Secondary Dressing ABD Pad 5x9 (in/in) Discharge Instruction: Cover with ABD pad Secured With Compression Wrap Profore Lite LF 3 Multilayer Compression [place] Discharge Instruction: Apply 3 multi-layer wrap as prescribed. Compression Stockings Add-Ons Electronic Signature(s) Signed: 02/26/2021 12:42:22 PM By: Donnamarie Poag Entered ByDonnamarie Poag on 02/26/2021 10:53:47 Iten,  Meredith L. ([id number]) -------------------------------------------------------------------------------- Vitals Details Patient Name: Mendiola, Amaiyah L. Date of Service: 02/26/2021 10:30 AM Medical Record Number: [id number]  Patient Account Number: [id number] Date of Birth/Sex: 1929-10-21 (87 y.o. F) Treating RN: Donnamarie Poag Primary Care Dejai Schubach: [name] Other Clinician: Referring Masin Shatto: [name] Treating Nakai Yard/Extender: [name] in Treatment: 7 Vital Signs Time Taken: 10:31 Temperature (F): 98.0 Height (in): 67 Pulse (bpm): 75 Weight (lbs): 147 Respiratory Rate (breaths/min): 18 Body Mass Index (BMI): 23 Blood Pressure (mmHg): 159/82 Reference Range: 80 - 120 mg / dl Electronic Signature(s) Signed: 02/26/2021 12:42:22 PM By: Donnamarie Poag Entered ByDonnamarie Poag on 02/26/2021 10:32:30

## 2021-02-26 NOTE — Telephone Encounter (Signed)
This patient of Dr Dossie Arbour will run out of her oxycodone  tomorrow.  She has an appointment on 03-04-2021.  I dont know why the appointment is after her meds run out, but could you send one in for her.  Thank you

## 2021-03-03 ENCOUNTER — Other Ambulatory Visit (INDEPENDENT_AMBULATORY_CARE_PROVIDER_SITE_OTHER): Payer: Self-pay | Admitting: Vascular Surgery

## 2021-03-03 DIAGNOSIS — I739 Peripheral vascular disease, unspecified: Secondary | ICD-10-CM

## 2021-03-03 NOTE — Progress Notes (Signed)
PROVIDER NOTE: Information contained herein reflects review and annotations entered in association with encounter. Interpretation of such information and data should be left to medically-trained personnel. Information provided to patient can be located elsewhere in the medical record under "Patient Instructions". Document created using STT-dictation technology, any transcriptional errors that may result from process are unintentional.    Patient: Kristin [name]  Service Category: E/M  Provider: Gaspar Cola, MD  DOB: May 10, 1930  DOS: 03/04/2021  Specialty: Interventional Pain Management  MRN: [id number]  Setting: Ambulatory outpatient  PCP: Olin Hauser, DO  Type: Established Patient    Referring Provider: [name] *  Location: Office  Delivery: Face-to-face     HPI  [name], a 87 y.o. year old female, is here today because of her Chronic pain syndrome [G89.4]. Ms. Tennant primary complain today is Back Pain (low), Leg Pain (Bilateral, mostly left), and Shoulder Pain (right) Last encounter: My last encounter with her was on 12/25/2020. Pertinent problems: Ms. 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); Epidural fibrosis; Lumbar foraminal stenosis (Severe Left L4-5); Chronic sacroiliac joint pain (Left); Osteoarthritis involving multiple joints; Chronic neck pain; Chronic hip pain (Bilateral); Chronic cervical radicular  pain (3ry area of Pain) (Bilateral) (L>R); Chronic lower extremity pain (2ry area of Pain) (Bilateral) (L>R); DDD (degenerative disc disease), cervical; Numbness and tingling of upper extremity (Bilateral); Chronic shoulder pain, Radicular (Bilateral) (L>R); Cervical facet syndrome (Bilateral) (L>R); Cervical (3-4 mm) Anterolisthesis of C4 on C5; Cervical foraminal stenosis (Bilateral); Spondylosis without myelopathy or radiculopathy, lumbosacral region; Chronic shoulders pain (Bilateral) (L>R); Osteoarthritis of shoulder (Bilateral); Osteoarthritis of hip (Bilateral); Chronic shoulder pain after shoulder replacement (Bilateral) (L>R); Trigger finger, left middle finger; Cervicalgia; Greater trochanteric bursitis of hip (Left); Other specified dorsopathies, sacral and sacrococcygeal region; Chronic hip pain (Left); Osteoarthritis of hip (Left); Abnormal MRI, lumbar spine (11/03/2019); Chronic sacroiliac pain (Bilateral); Osteoarthritis of sacroiliac joints (Bilateral) ([place]); Chronic low back pain (Bilateral) w/o sciatica; and Atherosclerosis of native arteries of the extremities with ulceration (HCC) on their pertinent problem list. Pain Assessment: Severity of Chronic pain is reported as a 6 /10. Location: Back Lower/radiates down both legs to the back of knee, mostly on the left. Onset: More than a month ago. Quality: Aching, Stabbing. Timing: Constant. Modifying factor(s): nothing. Vitals:  height is _0  (1.676 m) and weight is 150 lb (68 kg). Her temperature is 96.9 F (36.1 C) (abnormal). Her blood pressure is 142/79 (abnormal) and her pulse is 61. Her respiration is 18 and oxygen saturation is 100%.   Reason for encounter: medication management.   The patient indicates doing well with the current medication regimen. No adverse reactions or side effects reported to the medications. UDS ordered today.  The patient came into the clinic today accompanied by her granddaughter who is her current caregiver.   After talking to her, it would seem that oxycodone IR 10 mg twice daily seems to provide her with the best benefit and the least amount of problems.  The twice daily schedule seems to be convenient for her  granddaughter who is the one who actually controls the medications.  I had thought about the possibility of changing the medication to 5 mg 3 times daily or 4 times daily, but she has indicated that this would be inconvenient and she thinks that the 5 mg would probably not be sufficient to control her pain.  She has reported to me that with the 10 mg she is not taking naps during the day as she was when she was taking the 20 mg.  She seems to be moving better, but she ran out of medication a couple days ago and has been having more pain.  Her current pain is in the lower back, bilaterally.  She denies any pain going down the legs but she does have some pain on the left ankle and foot area from a source that became infected and he has been managed at the wound center.  She had to do a round of antibiotics, but currently she is not taking any.  She is scheduled to go back to the wound center tomorrow morning for further care.  Today the patient also asked me about a cyst in the back of her right wrist that appears to be a ganglion cyst.  I have explained to her that this is something that could be removed by a surgeon, but it does have the tendency to return.  She stated that currently is not giving her any problems and that she only wanted to know what it was.  In view of her current low back pain, we will be bringing her back for a palliative bilateral lumbar facet block under fluoroscopic guidance.  No sedation needed.  UDS ordered today.   RTCB: 06/01/2021 Nonopioids transferred 06/04/2020: Requip, MiraLAX, Dulcolax, and melatonin  Pharmacotherapy Assessment  Analgesic: Oxycodone IR 10 mg, 1 tab PO BID (20 mg/day of oxycodone) MME/day: 30 mg/day.   Monitoring: [place] PMP: PDMP reviewed during this  encounter.       Pharmacotherapy: No side-effects or adverse reactions reported. Compliance: No problems identified. Effectiveness: Clinically acceptable.  Dewayne Shorter, RN  03/04/2021  3:16 PM  Sign when Signing Visit Nursing Pain Medication Assessment:  Safety precautions to be maintained throughout the outpatient stay will include: orient to surroundings, keep bed in low position, maintain call bell within reach at all times, provide assistance with transfer out of bed and ambulation.  Medication Inspection Compliance: Pill count conducted under aseptic conditions, in front of the patient. Neither the pills nor the bottle was removed from the patient's sight at any time. Once count was completed pills were immediately returned to the patient in their original bottle.  Medication: Oxycodone IR Pill/Patch Count:  0 of 60 pills remain Pill/Patch Appearance: Markings consistent with prescribed medication Bottle Appearance: Standard pharmacy container. Clearly labeled. Filled Date: 06 / 09 / 2022 Last Medication intake:   last took 02-27-2001    UDS:  Summary  Date Value Ref Range Status  11/24/2020 Note  Final    Comment:    ==================================================================== [employer] Select 13 (MW) ==================================================================== Test                             Result       Flag       Units  Drug Present not Declared for Prescription Verification   Buprenorphine                  180  UNEXPECTED ng/mg creat   Norbuprenorphine               108          UNEXPECTED ng/mg creat    Source of buprenorphine is a scheduled prescription medication.    Norbuprenorphine is an expected metabolite of buprenorphine.  Drug Absent but Declared for Prescription Verification   Oxycodone                      Not Detected UNEXPECTED ng/mg creat ==================================================================== Test                       Result    Flag   Units      Ref Range   Creatinine              120              mg/dL      >=20 ==================================================================== Declared Medications:  The flagging and interpretation on this report are based on the  following declared medications.  Unexpected results may arise from  inaccuracies in the declared medications.   **Note: The testing scope of this panel includes these medications:   Oxycodone   **Note: The testing scope of this panel does not include the  following reported medications:   Acetaminophen  Albuterol  Apixaban  Aspirin  Atorvastatin  Bisacodyl  Dextromethorphan (Mucinex DM)  Furosemide  Guaifenesin (Mucinex DM)  Helium  Levocetirizine  Melatonin  Oxygen  Polyethylene Glycol  Ropinirole  Solifenacin  Supplement  Topical  Topical Diclofenac  Vitamin D ==================================================================== For clinical consultation, please call [phone]. ====================================================================      ROS  Constitutional: Denies any fever or chills Gastrointestinal: No reported hemesis, hematochezia, vomiting, or acute GI distress Musculoskeletal: Denies any acute onset joint swelling, redness, loss of ROM, or weakness Neurological: No reported episodes of acute onset apraxia, aphasia, dysarthria, agnosia, amnesia, paralysis, loss of coordination, or loss of consciousness  Medication Review  Benefiber, Melatonin, Oxycodone HCl, Oxygen-Helium, Tiotropium Bromide-Olodaterol, Vitamin D, acetaminophen, albuterol, apixaban, aspirin EC, atorvastatin, bisacodyl, dextromethorphan-guaiFENesin, furosemide, levocetirizine, lidocaine, lisinopril, rOPINIRole, solifenacin, and umeclidinium-vilanterol  History Review  Allergy: Ms. Kimbrough is allergic to flexeril [cyclobenzaprine hcl] and vancomycin. Drug: Ms. Wurth  reports current drug use. Drug: Oxycodone. Alcohol:   reports no history of alcohol use. Tobacco:  reports that she quit smoking about 22 years ago. Her smoking use included cigarettes. She has a 50.00 pack-year smoking history. She has quit using smokeless tobacco. Social: Ms. Bastarache  reports that she quit smoking about 22 years ago. Her smoking use included cigarettes. She has a 50.00 pack-year smoking history. She has quit using smokeless tobacco. She reports current drug use. Drug: Oxycodone. She reports that she does not drink alcohol. Medical:  has a past medical history of Acute postoperative pain (02/09/2018), Arrhythmia, sinus node (04/03/2014), Arthritis, Arthritis, Atrial fibrillation ([place]), Back pain, Bradycardia, Breast cancer ([place]) (2004), Cancer ([place]) (2004), CHF (congestive heart failure) ([place]), Chronic back pain, CKD (chronic kidney disease), COPD (chronic obstructive pulmonary disease) ([place]), Cystocele, Decubitus ulcers, Dehydration, Gout, Hematuria, Hepatitis C, Hyperlipidemia, Hypertension, Hypomagnesemia (06/25/2015), OAB (overactive bladder), Overactive bladder, Restless leg, Shoulder pain, bilateral, Urinary frequency, UTI (lower urinary tract infection), and Vaginal atrophy. Surgical: Ms. Hecht  has a past surgical history that includes Abdominal hysterectomy; Back surgery; Rotator cuff repair; Breast surgery; Cataract extraction w/PHACO (10/19/2011); Cataract extraction w/PHACO (11/02/2011); Carpal tunnel release; Joint replacement; Colon surgery; Pacemaker insertion (N/A,  12/24/2015); Cholecystectomy; Breast lumpectomy (Right, 2004); Insert / replace / remove pacemaker; and Lower Extremity Angiography (Left, 10/14/2020). Family: family history includes Breast cancer in her paternal aunt; Breast cancer (age of onset: 90) in her sister; Cancer in her sister; Heart disease in her father and mother; Kidney disease in her brother; Stroke in her mother.  Laboratory Chemistry Profile   Renal Lab Results  Component Value Date   BUN 31 (H)  11/07/2020   CREATININE 1.43 (H) 11/07/2020   BCR 29 (H) 10/02/2020   GFRAA 52 (L) 10/02/2020   GFRNONAA 35 (L) 11/07/2020    Hepatic Lab Results  Component Value Date   AST 21 11/07/2020   ALT 16 11/07/2020   ALBUMIN 3.7 11/07/2020   ALKPHOS 55 11/07/2020    Electrolytes Lab Results  Component Value Date   NA 134 (L) 11/07/2020   K 4.2 11/07/2020   CL 108 11/07/2020   CALCIUM 9.0 11/07/2020   MG 2.0 11/07/2020   PHOS 3.0 08/05/2020    Bone Lab Results  Component Value Date   VD25OH 31 04/01/2020    Inflammation (CRP: Acute Phase) (ESR: Chronic Phase) Lab Results  Component Value Date   CRP <0.5 09/24/2015   ESRSEDRATE 12 09/24/2015   LATICACIDVEN 1.8 08/02/2020       Note: Above Lab results reviewed.  Recent Imaging Review  CT CHEST WO CONTRAST CLINICAL DATA:  87 year old female with history of recent pneumonia.  EXAM: CT CHEST WITHOUT CONTRAST  TECHNIQUE: Multidetector CT imaging of the chest was performed following the standard protocol without IV contrast.  COMPARISON:  Chest CT 09/07/2014.  FINDINGS: Cardiovascular: Heart size is normal. There is no significant pericardial fluid, thickening or pericardial calcification. There is aortic atherosclerosis, as well as atherosclerosis of the great vessels of the mediastinum and the coronary arteries, including calcified atherosclerotic plaque in the left main, left anterior descending, left circumflex and right coronary arteries. Calcifications of the aortic valve. Left-sided pacemaker device in place with lead tips terminating in the right atrium and right ventricular apex.  Mediastinum/Nodes: No pathologically enlarged mediastinal or hilar lymph nodes. Please note that accurate exclusion of hilar adenopathy is limited on noncontrast CT scans. Esophagus is unremarkable in appearance. No axillary lymphadenopathy.  Lungs/Pleura: Widespread areas of ground-glass attenuation, septal thickening,  cylindrical and varicose bronchiectasis and extensive thickening of the peribronchovascular interstitium with regional areas of architectural distortion noted in a random distribution throughout the lungs bilaterally with a mild craniocaudal gradient. In addition, there is diffuse bronchial wall thickening with mild to moderate centrilobular and paraseptal emphysema. No acute consolidative airspace disease. No pleural effusions. A few scattered pulmonary nodules are noted throughout the lungs bilaterally, largest of which measures 7 mm in the left upper lobe abutting the major fissure (axial image 80 of series 4), stable compared to 2016, considered benign. No other larger more suspicious appearing pulmonary nodules or masses are noted.  Upper Abdomen: Aortic atherosclerosis. Status post cholecystectomy. 1.7 cm low-attenuation lesion in the upper pole of the left kidney, incompletely characterized on today's non-contrast CT examination, but statistically likely to represent a cyst.  Musculoskeletal: Status post bilateral shoulder arthroplasty. There are no aggressive appearing lytic or blastic lesions noted in the visualized portions of the skeleton.  IMPRESSION: 1. No acute findings on today's examination. 2. The appearance of the lungs suggests interstitial lung disease, with a spectrum of findings considered probable usual interstitial pneumonia (UIP) per current ATS guidelines. Mild progression compared to the prior study from 2016.  In addition, there is a background of diffuse bronchial wall thickening with mild to moderate centrilobular and paraseptal emphysema; imaging findings suggestive of concurrent COPD. 3. Aortic atherosclerosis, in addition to left main and 3 vessel coronary artery disease. 4. There are calcifications of the aortic valve. Echocardiographic correlation for evaluation of potential valvular dysfunction may be warranted if clinically indicated. 5.  Additional incidental findings, as above.  Aortic Atherosclerosis (ICD10-I70.0) and Emphysema (ICD10-J43.9).  Electronically Signed   By: Vinnie Langton M.D.   On: 01/09/2021 10:41 Note: Reviewed        Physical Exam  General appearance: Well nourished, well developed, and well hydrated. In no apparent acute distress Mental status: Alert, oriented x 3 (person, place, & time)       Respiratory: No evidence of acute respiratory distress Eyes: PERLA Vitals: BP (!) 142/79   Pulse 61   Temp (!) 96.9 F (36.1 C)   Resp 18   Ht _0  (1.676 m)   Wt 150 lb (68 kg)   SpO2 100%   BMI 24.21 kg/m  BMI: Estimated body mass index is 24.21 kg/m as calculated from the following:   Height as of this encounter: _1  (1.676 m).   Weight as of this encounter: 150 lb (68 kg). Ideal: Ideal body weight: 59.3 kg (130 lb 11.7 oz) Adjusted ideal body weight: 62.8 kg (138 lb 7 oz)  Assessment   Status Diagnosis  Controlled Controlled Controlled 1. Chronic pain syndrome   2. 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 Chronic low back pain (1ry area of Pain) (Bilateral) (L>R) w/ sciatica (Bilateral)   4. Chronic lower extremity pain (2ry area of Pain) (Bilateral) (L>R)   5. Chronic cervical radicular pain (3ry area of Pain) (Bilateral) (L>R)   6. Pharmacologic therapy   7. Chronic use of opiate for therapeutic purpose   8. Encounter for medication management   9. Lumbar facet syndrome (Bilateral) (L>R)   10. Lumbar facet hypertrophy (L1-2, L2-3, and L4-5)      Updated Problems: No problems updated.  Plan of Care  Problem-specific:  No problem-specific Assessment & Plan notes found for this encounter.  Ms. MARKEYA MINCY has a current medication list which includes the following long-term medication(s): albuterol, apixaban, atorvastatin, bisacodyl, furosemide, levocetirizine, ropinirole, oxycodone hcl, [START ON 04/03/2021] oxycodone hcl, [START ON 05/03/2021]  oxycodone hcl, and benefiber.  Pharmacotherapy (Medications Ordered): Meds ordered this encounter  Medications   DISCONTD: oxyCODONE (OXY IR/ROXICODONE) 5 MG immediate release tablet    Sig: Take 1 tablet (5 mg total) by mouth every 8 (eight) hours as needed for severe pain. Must last 30 days.    Dispense:  90 tablet    Refill:  0    Not a duplicate. Do NOT delete! Dispense 1 day early if closed on fill date. Warn not to take CNS-depressants 8 hours before or after taking opioid. Do not send refill request. Renewal requires appointment.   DISCONTD: oxyCODONE (OXY IR/ROXICODONE) 5 MG immediate release tablet    Sig: Take 1 tablet (5 mg total) by mouth every 8 (eight) hours as needed for severe pain. Must last 30 days.    Dispense:  90 tablet    Refill:  0    Not a duplicate. Do NOT delete! Dispense 1 day early if closed on fill date. Warn not to take CNS-depressants 8 hours before or after taking opioid. Do not send refill request. Renewal requires appointment.   DISCONTD: oxyCODONE (  OXY IR/ROXICODONE) 5 MG immediate release tablet    Sig: Take 1 tablet (5 mg total) by mouth every 8 (eight) hours as needed for severe pain. Must last 30 days.    Dispense:  90 tablet    Refill:  0    Not a duplicate. Do NOT delete! Dispense 1 day early if closed on fill date. Warn not to take CNS-depressants 8 hours before or after taking opioid. Do not send refill request. Renewal requires appointment.   oxyCODONE 10 MG TABS    Sig: Take 1 tablet (10 mg total) by mouth in the morning and at bedtime. Must last 30 days.    Dispense:  60 tablet    Refill:  0    Not a duplicate. Do NOT delete! Dispense 1 day early if closed on fill date. Warn not to take CNS-depressants 8 hours before or after taking opioid. Do not send refill request. Renewal requires appointment.   oxyCODONE 10 MG TABS    Sig: Take 1 tablet (10 mg total) by mouth in the morning and at bedtime. Must last 30 days.    Dispense:  60 tablet     Refill:  0    Not a duplicate. Do NOT delete! Dispense 1 day early if closed on fill date. Warn not to take CNS-depressants 8 hours before or after taking opioid. Do not send refill request. Renewal requires appointment.   oxyCODONE 10 MG TABS    Sig: Take 1 tablet (10 mg total) by mouth in the morning and at bedtime. Must last 30 days.    Dispense:  60 tablet    Refill:  0    Not a duplicate. Do NOT delete! Dispense 1 day early if closed on fill date. Warn not to take CNS-depressants 8 hours before or after taking opioid. Do not send refill request. Renewal requires appointment.    Orders:  Orders Placed This Encounter  Procedures   LUMBAR FACET(MEDIAL BRANCH NERVE BLOCK) MBNB    Standing Status:   Future    Standing Expiration Date:   04/04/2021    Scheduling Instructions:     Procedure: Lumbar facet block (AKA.: Lumbosacral medial branch nerve block)     Side: Bilateral     Level: L3-4, L4-5, & L5-S1 Facets (L2, L3, L4, L5, & S1 Medial Branch Nerves)     Sedation: No Sedation.     Timeframe: ASAA    Order Specific Question:   Where will this procedure be performed?    Answer:   [facility]   Tox[employer]SU[employer] Select 13 (MW), Urine    Volume: 30 ml(s). Minimum 3 ml of urine is needed. Document temperature of fresh sample. Indications: Long term (current) use of opiate analgesic (K24.097)    Order Specific Question:   Release to patient    Answer:   Immediate    Follow-up plan:   Return for Procedure (no sedation): (B) L-FCT Blk.     Interventional Therapies  Risk  Complexity Considerations:   NOTE: ELIQUIS ANTICOAGULATION (Stop: x3 days prior to procedure  Restart 6 hours after procedures)   Planned  Pending:   Pending further evaluation   Under consideration:   Diagnostic right SI joint block  Possible bilateral SI joint RFA  Diagnostic bilateral femoral nerve + obturator NB  Possible bilateral femoral nerve + obturator nerve RFA  Diagnostic right IA shoulder joint  injection     Completed:   Diagnostic/therapeutic right L2 TFESI x1 (01/31/2020)  Diagnostic/therapeutic left  L4 TFESI x1 (01/31/2020) Diagnostic left SI joint block x2 (09/27/2019) Palliative left CESI x2 (10/13/2017) Palliative left trochanteric bursa injection x1 (03/27/2019)  Palliative right IA hip inj. x1  (100% on the right) (02/08/2019) Palliative left IA hip inj. x2 (02/08/2019) Palliative right lumbar facet MBB x2 (01/10/2020) Palliative left lumbar facet MBB x4 (01/10/2020) Palliative right lumbar facet RFA x1 (09/19/18) Palliative left lumbar facet RFA x3 (08/01/18) Diagnostic/therapeutic right suprascapular NB x3 (05/10/2017)  Palliative bilateral suprascapular nerve RFA x1 (07/13/18)   Therapeutic  Palliative (PRN) options:   Diagnostic left SI joint block #2  Palliative left CESI #3  Palliative left trochanteric bursa injection #2  Palliative right IA hip inj. #2  (100% on the right) Palliative left IA hip inj. #3   Palliative right lumbar facet block #3  Palliative left lumbar facet block #5  Palliative right lumbar facet RFA #2  Palliative left lumbar facet RFA #4  Palliative bilateral suprascapular nerve block   Palliative bilateral suprascapular nerve RFA #2     Recent Visits Date Type Provider Dept  01/28/21 Telemedicine Milinda Pointer, MD [facility]  12/29/20 Telemedicine Milinda Pointer, MD [facility]  Showing recent visits within past 90 days and meeting all other requirements Today's Visits Date Type Provider Dept  03/04/21 Office Visit Milinda Pointer, MD [facility]  Showing today's visits and meeting all other requirements Future Appointments Date Type Provider Dept  05/27/21 Appointment Milinda Pointer, MD [facility]  Showing future appointments within next 90 days and meeting all other requirements I discussed the assessment and treatment plan with the patient. The patient was provided an opportunity  to ask questions and all were answered. The patient agreed with the plan and demonstrated an understanding of the instructions.  Patient advised to call back or seek an in-person evaluation if the symptoms or condition worsens.  Duration of encounter: 30 minutes.  Note by: Gaspar Cola, MD Date: 03/04/2021; Time: 3:49 PM

## 2021-03-04 ENCOUNTER — Telehealth: Payer: Self-pay

## 2021-03-04 ENCOUNTER — Ambulatory Visit: Payer: Medicare HMO | Attending: Pain Medicine | Admitting: Pain Medicine

## 2021-03-04 ENCOUNTER — Other Ambulatory Visit: Payer: Self-pay

## 2021-03-04 ENCOUNTER — Encounter: Payer: Self-pay | Admitting: Pain Medicine

## 2021-03-04 VITALS — BP 142/79 | HR 61 | Temp 96.9°F | Resp 18 | Ht 66.0 in | Wt 150.0 lb

## 2021-03-04 DIAGNOSIS — M961 Postlaminectomy syndrome, not elsewhere classified: Secondary | ICD-10-CM | POA: Insufficient documentation

## 2021-03-04 DIAGNOSIS — G8929 Other chronic pain: Secondary | ICD-10-CM | POA: Diagnosis not present

## 2021-03-04 DIAGNOSIS — M47816 Spondylosis without myelopathy or radiculopathy, lumbar region: Secondary | ICD-10-CM | POA: Insufficient documentation

## 2021-03-04 DIAGNOSIS — Z79891 Long term (current) use of opiate analgesic: Secondary | ICD-10-CM | POA: Diagnosis not present

## 2021-03-04 DIAGNOSIS — G894 Chronic pain syndrome: Secondary | ICD-10-CM | POA: Insufficient documentation

## 2021-03-04 DIAGNOSIS — M5442 Lumbago with sciatica, left side: Secondary | ICD-10-CM

## 2021-03-04 DIAGNOSIS — M5412 Radiculopathy, cervical region: Secondary | ICD-10-CM | POA: Diagnosis not present

## 2021-03-04 DIAGNOSIS — M5441 Lumbago with sciatica, right side: Secondary | ICD-10-CM | POA: Diagnosis not present

## 2021-03-04 DIAGNOSIS — M79605 Pain in left leg: Secondary | ICD-10-CM

## 2021-03-04 DIAGNOSIS — Z79899 Other long term (current) drug therapy: Secondary | ICD-10-CM | POA: Insufficient documentation

## 2021-03-04 DIAGNOSIS — M79604 Pain in right leg: Secondary | ICD-10-CM | POA: Insufficient documentation

## 2021-03-04 MED ORDER — OXYCODONE HCL 5 MG PO TABS: 5.0000 mg | ORAL_TABLET | Freq: Three times a day (TID) | ORAL | 0 refills | Status: DC | PRN

## 2021-03-04 MED ORDER — OXYCODONE HCL 10 MG PO TABS: 10.0000 mg | ORAL_TABLET | Freq: Two times a day (BID) | ORAL | 0 refills | Status: DC

## 2021-03-04 NOTE — Telephone Encounter (Signed)
spOke with Levada Dy at [facility] and cancelled 1 script for oxycodone 5 mg.  [place], spoke to [place] and cancelled 2 scripts for oxycodone 5 mg.

## 2021-03-04 NOTE — Progress Notes (Signed)
Nursing Pain Medication Assessment:  Safety precautions to be maintained throughout the outpatient stay will include: orient to surroundings, keep bed in low position, maintain call bell within reach at all times, provide assistance with transfer out of bed and ambulation.  Medication Inspection Compliance: Pill count conducted under aseptic conditions, in front of the patient. Neither the pills nor the bottle was removed from the patient's sight at any time. Once count was completed pills were immediately returned to the patient in their original bottle.  Medication: Oxycodone IR Pill/Patch Count:  0 of 60 pills remain Pill/Patch Appearance: Markings consistent with prescribed medication Bottle Appearance: Standard pharmacy container. Clearly labeled. Filled Date: 06 / 09 / 2022 Last Medication intake:   last took 02-27-2001

## 2021-03-04 NOTE — Patient Instructions (Addendum)
_Stop Eliquis for 3 full days prior to procedure ___________________________________________________________________________________________  Preparing for your procedure (without sedation)  Procedure appointments are limited to planned procedures: No Prescription Refills. No disability issues will be discussed. No medication changes will be discussed.  Instructions: Oral Intake: Do not eat or drink anything for at least 6 hours prior to your procedure. (Exception: Blood Pressure Medication. See below.) Transportation: Unless otherwise stated by your physician, you may drive yourself after the procedure. Blood Pressure Medicine: Do not forget to take your blood pressure medicine with a sip of water the morning of the procedure. If your Diastolic (lower reading)is above 100 mmHg, elective cases will be cancelled/rescheduled. Blood thinners: These will need to be stopped for procedures. Notify our staff if you are taking any blood thinners. Depending on which one you take, there will be specific instructions on how and when to stop it. Diabetics on insulin: Notify the staff so that you can be scheduled 1st case in the morning. If your diabetes requires high dose insulin, take only  of your normal insulin dose the morning of the procedure and notify the staff that you have done so. Preventing infections: Shower with an antibacterial soap the morning of your procedure.  Build-up your immune system: Take 1000 mg of Vitamin C with every meal (3 times a day) the day prior to your procedure. Antibiotics: Inform the staff if you have a condition or reason that requires you to take antibiotics before dental procedures. Pregnancy: If you are pregnant, call and cancel the procedure. Sickness: If you have a cold, fever, or any active infections, call and cancel the procedure. Arrival: You must be in the facility at least 30 minutes prior to your scheduled procedure. Children: Do not bring any children with  you. Dress appropriately: Bring dark clothing that you would not mind if they get stained. Valuables: Do not bring any jewelry or valuables.  Reasons to call and reschedule or cancel your procedure: (Following these recommendations will minimize the risk of a serious complication.) Surgeries: Avoid having procedures within 2 weeks of any surgery. (Avoid for 2 weeks before or after any surgery). Flu Shots: Avoid having procedures within 2 weeks of a flu shots or . (Avoid for 2 weeks before or after immunizations). Barium: Avoid having a procedure within 7-10 days after having had a radiological study involving the use of radiological contrast. (Myelograms, Barium swallow or enema study). Heart attacks: Avoid any elective procedures or surgeries for the initial 6 months after a "Myocardial Infarction" (Heart Attack). Blood thinners: It is imperative that you stop these medications before procedures. Let us know if you if you take any blood thinner.  Infection: Avoid procedures during or within two weeks of an infection (including chest colds or gastrointestinal problems). Symptoms associated with infections include: Localized redness, fever, chills, night sweats or profuse sweating, burning sensation when voiding, cough, congestion, stuffiness, runny nose, sore throat, diarrhea, nausea, vomiting, cold or Flu symptoms, recent or current infections. It is specially important if the infection is over the area that we intend to treat. Heart and lung problems: Symptoms that may suggest an active cardiopulmonary problem include: cough, chest pain, breathing difficulties or shortness of breath, dizziness, ankle swelling, uncontrolled high or unusually low blood pressure, and/or palpitations. If you are experiencing any of these symptoms, cancel your procedure and contact your primary care physician for an evaluation.  Remember:  Regular Business hours are:  Monday to Thursday 8:00 AM to 4:00 PM  Provider's  Schedule: Milinda Pointer, MD:  Procedure days: Tuesday and Thursday 7:30 AM to 4:00 PM  Gillis Santa, MD:  Procedure days: Monday and Wednesday 7:30 AM to 4:00 PM ____________________________________________________________________________________________  ____________________________________________________________________________________________  General Risks and Possible Complications  Patient Responsibilities: It is important that you read this as it is part of your informed consent. It is our duty to inform you of the risks and possible complications associated with treatments offered to you. It is your responsibility as a patient to read this and to ask questions about anything that is not clear or that you believe was not covered in this document.  Patient's Rights: You have the right to refuse treatment. You also have the right to change your mind, even after initially having agreed to have the treatment done. However, under this last option, if you wait until the last second to change your mind, you may be charged for the materials used up to that point.  Introduction: Medicine is not an Chief Strategy Officer. Everything in Medicine, including the lack of treatment(s), carries the potential for danger, harm, or loss (which is by definition: Risk). In Medicine, a complication is a secondary problem, condition, or disease that can aggravate an already existing one. All treatments carry the risk of possible complications. The fact that a side effects or complications occurs, does not imply that the treatment was conducted incorrectly. It must be clearly understood that these can happen even when everything is done following the highest safety standards.  No treatment: You can choose not to proceed with the proposed treatment alternative. The "PRO(s)" would include: avoiding the risk of complications associated with the therapy. The "CON(s)" would include: not getting any of the treatment benefits.  These benefits fall under one of three categories: diagnostic; therapeutic; and/or palliative. Diagnostic benefits include: getting information which can ultimately lead to improvement of the disease or symptom(s). Therapeutic benefits are those associated with the successful treatment of the disease. Finally, palliative benefits are those related to the decrease of the primary symptoms, without necessarily curing the condition (example: decreasing the pain from a flare-up of a chronic condition, such as incurable terminal cancer).  General Risks and Complications: These are associated to most interventional treatments. They can occur alone, or in combination. They fall under one of the following six (6) categories: no benefit or worsening of symptoms; bleeding; infection; nerve damage; allergic reactions; and/or death. No benefits or worsening of symptoms: In Medicine there are no guarantees, only probabilities. No healthcare provider can ever guarantee that a medical treatment will work, they can only state the probability that it may. Furthermore, there is always the possibility that the condition may worsen, either directly, or indirectly, as a consequence of the treatment. Bleeding: This is more common if the patient is taking a blood thinner, either prescription or over the counter (example: Goody Powders, Fish oil, Aspirin, Garlic, etc.), or if suffering a condition associated with impaired coagulation (example: Hemophilia, cirrhosis of the liver, low platelet counts, etc.). However, even if you do not have one on these, it can still happen. If you have any of these conditions, or take one of these drugs, make sure to notify your treating physician. Infection: This is more common in patients with a compromised immune system, either due to disease (example: diabetes, cancer, human immunodeficiency virus [HIV], etc.), or due to medications or treatments (example: therapies used to treat cancer and  rheumatological diseases). However, even if you do not have one on these, it can still  happen. If you have any of these conditions, or take one of these drugs, make sure to notify your treating physician. Nerve Damage: This is more common when the treatment is an invasive one, but it can also happen with the use of medications, such as those used in the treatment of cancer. The damage can occur to small secondary nerves, or to large primary ones, such as those in the spinal cord and brain. This damage may be temporary or permanent and it may lead to impairments that can range from temporary numbness to permanent paralysis and/or brain death. Allergic Reactions: Any time a substance or material comes in contact with our body, there is the possibility of an allergic reaction. These can range from a mild skin rash (contact dermatitis) to a severe systemic reaction (anaphylactic reaction), which can result in death. Death: In general, any medical intervention can result in death, most of the time due to an unforeseen complication. ____________________________________________________________________________________________ ____________________________________________________________________________________________  Medication Rules  Purpose: To inform patients, and their family members, of our rules and regulations.  Applies to: All patients receiving prescriptions (written or electronic).  Pharmacy of record: Pharmacy where electronic prescriptions will be sent. If written prescriptions are taken to a different pharmacy, please inform the nursing staff. The pharmacy listed in the electronic medical record should be the one where you would like electronic prescriptions to be sent.  Electronic prescriptions: In compliance with the [place] (STOP) Act of 2017 (Session Lanny Cramp [phone]), effective August 23, 2018, all controlled substances must be electronically  prescribed. Calling prescriptions to the pharmacy will cease to exist.  Prescription refills: Only during scheduled appointments. Applies to all prescriptions.  NOTE: The following applies primarily to controlled substances (Opioid* Pain Medications).   Type of encounter (visit): For patients receiving controlled substances, face-to-face visits are required. (Not an option or up to the patient.)  Patient's responsibilities: Pain Pills: Bring all pain pills to every appointment (except for procedure appointments). Pill Bottles: Bring pills in original pharmacy bottle. Always bring the newest bottle. Bring bottle, even if empty. Medication refills: You are responsible for knowing and keeping track of what medications you take and those you need refilled. The day before your appointment: write a list of all prescriptions that need to be refilled. The day of the appointment: give the list to the admitting nurse. Prescriptions will be written only during appointments. No prescriptions will be written on procedure days. If you forget a medication: it will not be "Called in", "Faxed", or "electronically sent". You will need to get another appointment to get these prescribed. No early refills. Do not call asking to have your prescription filled early. Prescription Accuracy: You are responsible for carefully inspecting your prescriptions before leaving our office. Have the discharge nurse carefully go over each prescription with you, before taking them home. Make sure that your name is accurately spelled, that your address is correct. Check the name and dose of your medication to make sure it is accurate. Check the number of pills, and the written instructions to make sure they are clear and accurate. Make sure that you are given enough medication to last until your next medication refill appointment. Taking Medication: Take medication as prescribed. When it comes to controlled substances, taking less pills  or less frequently than prescribed is permitted and encouraged. Never take more pills than instructed. Never take medication more frequently than prescribed.  Inform other Doctors: Always inform, all of your healthcare providers, of  all the medications you take. Pain Medication from other Providers: You are not allowed to accept any additional pain medication from any other Doctor or Healthcare provider. There are two exceptions to this rule. (see below) In the event that you require additional pain medication, you are responsible for notifying us, as stated below. Cough Medicine: Often these contain an opioid, such as codeine or hydrocodone. Never accept or take cough medicine containing these opioids if you are already taking an opioid* medication. The combination may cause respiratory failure and death. Medication Agreement: You are responsible for carefully reading and following our Medication Agreement. This must be signed before receiving any prescriptions from our practice. Safely store a copy of your signed Agreement. Violations to the Agreement will result in no further prescriptions. (Additional copies of our Medication Agreement are available upon request.) Laws, Rules, & Regulations: All patients are expected to follow all Federal and [employer], [employer], Rules, [employer] [facility]. Ignorance of the Laws does not constitute a valid excuse.  Illegal drugs and Controlled Substances: The use of illegal substances (including, but not limited to marijuana and its derivatives) and/or the illegal use of any controlled substances is strictly prohibited. Violation of this rule may result in the immediate and permanent discontinuation of any and all prescriptions being written by our practice. The use of any illegal substances is prohibited. Adopted CDC guidelines & recommendations: Target dosing levels will be at or below 60 MME/day. Use of benzodiazepines** is not recommended.  Exceptions: There are only  two exceptions to the rule of not receiving pain medications from other Healthcare Providers. Exception #1 (Emergencies): In the event of an emergency (i.e.: accident requiring emergency care), you are allowed to receive additional pain medication. However, you are responsible for: As soon as you are able, call our office [phone], at any time of the day or night, and leave a message stating your name, the date and nature of the emergency, and the name and dose of the medication prescribed. In the event that your call is answered by a member of our staff, make sure to document and save the date, time, and the name of the person that took your information.  Exception #2 (Planned Surgery): In the event that you are scheduled by another doctor or dentist to have any type of surgery or procedure, you are allowed (for a period no longer than 30 days), to receive additional pain medication, for the acute post-op pain. However, in this case, you are responsible for picking up a copy of our "Post-op Pain Management for Surgeons" handout, and giving it to your surgeon or dentist. This document is available at our office, and does not require an appointment to obtain it. Simply go to our office during business hours (Monday-Thursday from 8:00 AM to 4:00 PM) (Friday 8:00 AM to 12:00 Noon) or if you have a scheduled appointment with [name], prior to your surgery, and ask for it by name. In addition, you are responsible for: calling our office [phone], at any time of the day or night, and leaving a message stating your name, name of your surgeon, type of surgery, and date of procedure or surgery. Failure to comply with your responsibilities may result in termination of therapy involving the controlled substances.  *Opioid medications include: morphine, codeine, oxycodone, oxymorphone, hydrocodone, hydromorphone, meperidine, tramadol, tapentadol, buprenorphine, fentanyl, methadone. **Benzodiazepine medications  include: diazepam (Valium), alprazolam (Xanax), clonazepam (Klonopine), lorazepam (Ativan), clorazepate (Tranxene), chlordiazepoxide (Librium), estazolam (Prosom), oxazepam (Serax), temazepam (Restoril), triazolam (  Halcion) (Last updated: 07/21/2020) ____________________________________________________________________________________________  ____________________________________________________________________________________________  Medication Recommendations and Reminders  Applies to: All patients receiving prescriptions (written and/or electronic).  Medication Rules & Regulations: These rules and regulations exist for your safety and that of others. They are not flexible and neither are we. Dismissing or ignoring them will be considered "non-compliance" with medication therapy, resulting in complete and irreversible termination of such therapy. (See document titled "Medication Rules" for more details.) In all conscience, because of safety reasons, we cannot continue providing a therapy where the patient does not follow instructions.  Pharmacy of record:  Definition: This is the pharmacy where your electronic prescriptions will be sent.  We do not endorse any particular pharmacy, however, we have experienced problems with [facility] not securing enough medication supply for the community. We do not restrict you in your choice of pharmacy. However, once we write for your prescriptions, we will NOT be re-sending more prescriptions to fix restricted supply problems created by your pharmacy, or your insurance.  The pharmacy listed in the electronic medical record should be the one where you want electronic prescriptions to be sent. If you choose to change pharmacy, simply notify our nursing staff.  Recommendations: Keep all of your pain medications in a safe place, under lock and key, even if you live alone. We will NOT replace lost, stolen, or damaged medication. After you fill your prescription,  take 1 week's worth of pills and put them away in a safe place. You should keep a separate, properly labeled bottle for this purpose. The remainder should be kept in the original bottle. Use this as your primary supply, until it runs out. Once it's gone, then you know that you have 1 week's worth of medicine, and it is time to come in for a prescription refill. If you do this correctly, it is unlikely that you will ever run out of medicine. To make sure that the above recommendation works, it is very important that you make sure your medication refill appointments are scheduled at least 1 week before you run out of medicine. To do this in an effective manner, make sure that you do not leave the office without scheduling your next medication management appointment. Always ask the nursing staff to show you in your prescription , when your medication will be running out. Then arrange for the receptionist to get you a return appointment, at least 7 days before you run out of medicine. Do not wait until you have 1 or 2 pills left, to come in. This is very poor planning and does not take into consideration that we may need to cancel appointments due to bad weather, sickness, or emergencies affecting our staff. DO NOT ACCEPT A "Partial Fill": If for any reason your pharmacy does not have enough pills/tablets to completely fill or refill your prescription, do not allow for a "partial fill". The law allows the pharmacy to complete that prescription within 72 hours, without requiring a new prescription. If they do not fill the rest of your prescription within those 72 hours, you will need a separate prescription to fill the remaining amount, which we will NOT provide. If the reason for the partial fill is your insurance, you will need to talk to the pharmacist about payment alternatives for the remaining tablets, but again, DO NOT ACCEPT A PARTIAL FILL, unless you can trust your pharmacist to obtain the remainder of the  pills within 72 hours.  Prescription refills and/or changes in medication(s):  Prescription refills, and/or  changes in dose or medication, will be conducted only during scheduled medication management appointments. (Applies to both, written and electronic prescriptions.) No refills on procedure days. No medication will be changed or started on procedure days. No changes, adjustments, and/or refills will be conducted on a procedure day. Doing so will interfere with the diagnostic portion of the procedure. No phone refills. No medications will be "called into the pharmacy". No Fax refills. No weekend refills. No Holliday refills. No after hours refills.  Remember:  Business hours are:  Monday to Thursday 8:00 AM to 4:00 PM Provider's Schedule: Milinda Pointer, MD - Appointments are:  Medication management: Monday and Wednesday 8:00 AM to 4:00 PM Procedure day: Tuesday and Thursday 7:30 AM to 4:00 PM Gillis Santa, MD - Appointments are:  Medication management: Tuesday and Thursday 8:00 AM to 4:00 PM Procedure day: Monday and Wednesday 7:30 AM to 4:00 PM (Last update: 03/12/2020) ____________________________________________________________________________________________  ____________________________________________________________________________________________  CBD (cannabidiol) WARNING  Applicable to: All individuals currently taking or considering taking CBD (cannabidiol) and, more important, all patients taking opioid analgesic controlled substances (pain medication). (Example: oxycodone; oxymorphone; hydrocodone; hydromorphone; morphine; methadone; tramadol; tapentadol; fentanyl; buprenorphine; butorphanol; dextromethorphan; meperidine; codeine; etc.)  Legal status: CBD remains a Schedule I drug prohibited for any use. CBD is illegal with one exception. In the [place], CBD has a limited Transport planner (FDA) approval for the treatment of two specific types of  epilepsy disorders. Only one CBD product has been approved by the FDA for this purpose: "Epidiolex". FDA is aware that some companies are marketing products containing cannabis and cannabis-derived compounds in ways that violate the [employer], Drug and Cosmetic Act [facility] Act) and that may put the health and safety of consumers at risk. The FDA, a Federal agency, has not enforced the CBD status since 2018.   Legality: Some manufacturers ship CBD products nationally, which is illegal. Often such products are sold online and are therefore available throughout the country. CBD is openly sold in head shops and health food stores in some states where such sales have not been explicitly legalized. Selling unapproved products with unsubstantiated therapeutic claims is not only a violation of the law, but also can put patients at risk, as these products have not been proven to be safe or effective. Federal illegality makes it difficult to conduct research on CBD.  Reference: "FDA Regulation of Cannabis and Cannabis-Derived Products, Including Cannabidiol (CBD)" - [employer]  Warning: CBD is not FDA approved and has not undergo the same manufacturing controls as prescription drugs.  This means that the purity and safety of available CBD may be questionable. Most of the time, despite manufacturer's claims, it is contaminated with THC (delta-9-tetrahydrocannabinol - the chemical in marijuana responsible for the "HIGH").  When this is the case, the [facility] contaminant will trigger a positive urine drug screen (UDS) test for Marijuana (carboxy-THC). Because a positive UDS for any illicit substance is a violation of our medication agreement, your opioid analgesics (pain medicine) may be permanently discontinued.  MORE ABOUT CBD  General Information: CBD  is a derivative of the Marijuana (cannabis  sativa) plant discovered in 61. It is one of the 113 identified substances found in Marijuana. It accounts for up to 40% of the plant's extract. As of 2018, preliminary clinical studies on CBD included research for the treatment of anxiety, movement disorders, and pain. CBD is available and consumed in multiple forms, including inhalation of smoke or vapor, as an aerosol spray, and by  mouth. It may be supplied as an oil containing CBD, capsules, dried cannabis, or as a liquid solution. CBD is thought not to be as psychoactive as THC (delta-9-tetrahydrocannabinol - the chemical in marijuana responsible for the "HIGH"). Studies suggest that CBD may interact with different biological target receptors in the body, including cannabinoid and other neurotransmitter receptors. As of 2018 the mechanism of action for its biological effects has not been determined.  Side-effects  Adverse reactions: Dry mouth, diarrhea, decreased appetite, fatigue, drowsiness, malaise, weakness, sleep disturbances, and others.  Drug interactions: CBC may interact with other medications such as blood-thinners. (Last update: 03/29/2020) ____________________________________________________________________________________________  ____________________________________________________________________________________________  Drug Holidays (Slow)  What is a "Drug Holiday"? Drug Holiday: is the name given to the period of time during which a patient stops taking a medication(s) for the purpose of eliminating tolerance to the drug.  Benefits Improved effectiveness of opioids. Decreased opioid dose needed to achieve benefits. Improved pain with lesser dose.  What is tolerance? Tolerance: is the progressive decreased in effectiveness of a drug due to its repetitive use. With repetitive use, the body gets use to the medication and as a consequence, it loses its effectiveness. This is a common problem seen with opioid pain  medications. As a result, a larger dose of the drug is needed to achieve the same effect that used to be obtained with a smaller dose.  How long should a "Drug Holiday" last? You should stay off of the pain medicine for at least 14 consecutive days. (2 weeks)  Should I stop the medicine "cold [place]"? No. You should always coordinate with your Pain Specialist so that he/she can provide you with the correct medication dose to make the transition as smoothly as possible.  How do I stop the medicine? Slowly. You will be instructed to decrease the daily amount of pills that you take by one (1) pill every seven (7) days. This is called a "slow downward taper" of your dose. For example: if you normally take four (4) pills per day, you will be asked to drop this dose to three (3) pills per day for seven (7) days, then to two (2) pills per day for seven (7) days, then to one (1) per day for seven (7) days, and at the end of those last seven (7) days, this is when the "Drug Holiday" would start.   Will I have withdrawals? By doing a "slow downward taper" like this one, it is unlikely that you will experience any significant withdrawal symptoms. Typically, what triggers withdrawals is the sudden stop of a high dose opioid therapy. Withdrawals can usually be avoided by slowly decreasing the dose over a prolonged period of time. If you do not follow these instructions and decide to stop your medication abruptly, withdrawals may be possible.  What are withdrawals? Withdrawals: refers to the wide range of symptoms that occur after stopping or dramatically reducing opiate drugs after heavy and prolonged use. Withdrawal symptoms do not occur to patients that use low dose opioids, or those who take the medication sporadically. Contrary to benzodiazepine (example: Valium, Xanax, etc.) or alcohol withdrawals ("Delirium Tremens"), opioid withdrawals are not lethal. Withdrawals are the physical manifestation of the body  getting rid of the excess receptors.  Expected Symptoms Early symptoms of withdrawal may include: Agitation Anxiety Muscle aches Increased tearing Insomnia Runny nose Sweating Yawning  Late symptoms of withdrawal may include: Abdominal cramping Diarrhea Dilated pupils Goose bumps Nausea Vomiting  Will I experience withdrawals? Due to  the slow nature of the taper, it is very unlikely that you will experience any.  What is a slow taper? Taper: refers to the gradual decrease in dose.  (Last update: 03/12/2020) ____________________________________________________________________________________________

## 2021-03-05 ENCOUNTER — Encounter: Payer: Medicare HMO | Admitting: Physician Assistant

## 2021-03-05 DIAGNOSIS — I872 Venous insufficiency (chronic) (peripheral): Secondary | ICD-10-CM | POA: Diagnosis not present

## 2021-03-05 DIAGNOSIS — L97522 Non-pressure chronic ulcer of other part of left foot with fat layer exposed: Secondary | ICD-10-CM | POA: Diagnosis not present

## 2021-03-05 DIAGNOSIS — I509 Heart failure, unspecified: Secondary | ICD-10-CM | POA: Diagnosis not present

## 2021-03-05 DIAGNOSIS — L97322 Non-pressure chronic ulcer of left ankle with fat layer exposed: Secondary | ICD-10-CM | POA: Diagnosis not present

## 2021-03-05 DIAGNOSIS — I130 Hypertensive heart and chronic kidney disease with heart failure and stage 1 through stage 4 chronic kidney disease, or unspecified chronic kidney disease: Secondary | ICD-10-CM | POA: Diagnosis not present

## 2021-03-05 DIAGNOSIS — Z7901 Long term (current) use of anticoagulants: Secondary | ICD-10-CM | POA: Diagnosis not present

## 2021-03-05 DIAGNOSIS — N189 Chronic kidney disease, unspecified: Secondary | ICD-10-CM | POA: Diagnosis not present

## 2021-03-05 DIAGNOSIS — I480 Paroxysmal atrial fibrillation: Secondary | ICD-10-CM | POA: Diagnosis not present

## 2021-03-05 DIAGNOSIS — G9009 Other idiopathic peripheral autonomic neuropathy: Secondary | ICD-10-CM | POA: Diagnosis not present

## 2021-03-05 NOTE — Progress Notes (Signed)
Kristin [name], [name] ([id number]) Visit Report for 03/05/2021 Arrival Information Details Patient Name: Kristin [name], [name] Date of Service: 03/05/2021 8:30 AM Medical Record Number: [id number] Patient Account Number: [id number] Date of Birth/Sex: [birth year] (87 y.o. F) Treating RN: Kristin [name] Primary Care Kristin [name]: Kristin [name] Kristin [name]: Kristin Kristin [name]: Kristin [name] Kristin [name]: Kristin [name] in Treatment: 8 Visit Information History Since Last Visit Added or deleted any medications: No Patient Arrived: Kristin [name] Had a fall or experienced change in No Arrival Time: 08:39 activities of daily living that may affect Accompanied By: self risk of falls: Transfer Assistance: None Hospitalized since last visit: No Patient Identification Verified: Yes Has Dressing in Place as Prescribed: Yes Secondary Verification Process Completed: Yes Has Compression in Place as Prescribed: Yes Patient Requires Transmission-Based No Pain Present Now: No Precautions: Patient Has Alerts: Yes Patient Alerts: Patient on Blood Thinner ELIQUIS and ASPIRIN NOT diabetic ABI left 0.81 @vascular  ABI right 0.83 @vascular  Electronic Signature(s) Signed: 03/05/2021 4:28:40 PM By: Kristin [name] Entered ByDonnamarie [name] on 03/05/2021 08:39:44 Kristin [name], [name] ([id number]) -------------------------------------------------------------------------------- Clinic Level of Care Assessment Details Patient Name: Kristin [name], [name] Date of Service: 03/05/2021 8:30 AM Medical Record Number: [id number] Patient Account Number: [id number] Date of Birth/Sex: [birth year] (87 y.o. F) Treating RN: Kristin [name] Primary Care Ahad Colarusso: Kristin [name] Kristin [name]: Kristin Koral [name]: Kristin [name] Kristin [name]: Kristin [name] in Treatment: 8 Clinic Level of Care Assessment Items TOOL 1 Quantity Score []  - Use when EandM and Procedure is performed on INITIAL visit  0 ASSESSMENTS - Nursing Assessment / Reassessment []  - General Physical Exam (combine w/ comprehensive assessment (listed just below) when performed on new 0 pt. evals) []  - 0 Comprehensive Assessment (HX, ROS, Risk Assessments, Wounds Hx, etc.) ASSESSMENTS - Wound and Skin Assessment / Reassessment []  - Dermatologic / Skin Assessment (not related to wound area) 0 ASSESSMENTS - Ostomy and/or Continence Assessment and Care []  - Incontinence Assessment and Management 0 []  - 0 Ostomy Care Assessment and Management (repouching, etc.) PROCESS - Coordination of Care []  - Simple Patient / Family Education for ongoing care 0 []  - 0 Complex (extensive) Patient / Family Education for ongoing care []  - 0 Staff obtains Programmer, systems, Records, Test Results / Process Orders []  - 0 Staff telephones HHA, Nursing Homes / Clarify orders / etc []  - 0 Routine Transfer to another Facility (non-emergent condition) []  - 0 Routine Hospital Admission (non-emergent condition) []  - 0 New Admissions / Biomedical engineer / Ordering NPWT, Apligraf, etc. []  - 0 Emergency Hospital Admission (emergent condition) PROCESS - Special Needs []  - Pediatric / Minor Patient Management 0 []  - 0 Isolation Patient Management []  - 0 Hearing / Language / Visual special needs []  - 0 Assessment of Community assistance (transportation, D/C planning, etc.) []  - 0 Additional assistance / Altered mentation []  - 0 Support Surface(s) Assessment (bed, cushion, seat, etc.) INTERVENTIONS - Miscellaneous []  - External ear exam 0 []  - 0 Patient Transfer (multiple staff / Civil Service fast streamer / Similar devices) []  - 0 Simple Staple / Suture removal (25 or less) []  - 0 Complex Staple / Suture removal (26 or more) []  - 0 Hypo/Hyperglycemic Management (do not check if billed separately) []  - 0 Ankle / Brachial Index (ABI) - do not check if billed separately Has the patient been seen at the hospital within the last three years:  Yes Total Score: 0 Level Of Care: ____ Seat, [name] ([id number]) Electronic Signature(s) Signed: 03/05/2021 4:28:40 PM By: Kristin [name] Entered By:  Kristin [name] on 03/05/2021 09:08:24 Kristin [name], [name] ([id number]) -------------------------------------------------------------------------------- Compression Therapy Details Patient Name: [name], [name] Date of Service: 03/05/2021 8:30 AM Medical Record Number: [id number] Patient Account Number: [id number] Date of Birth/Sex: 1930/06/15 (87 y.o. F) Treating RN: Kristin [name] Primary Care Satsuki Zillmer: Kristin [name] Kristin [name]: Kristin Abdulahi [name]: Kristin [name] Kristin [name]: Kristin [name] in Treatment: 8 Compression Therapy Performed for Wound Assessment: Wound #1R Left Malleolus Performed By: Kristin Argyle, RN Compression Type: Three Layer Post Procedure Diagnosis Same as Pre-procedure Electronic Signature(s) Signed: 03/05/2021 4:28:40 PM By: Kristin [name] Entered By: Kristin [name] on 03/05/2021 09:08:16 Kristin [name], [name] ([id number]) -------------------------------------------------------------------------------- Encounter Discharge Information Details Patient Name: Kristin [name], [name] Date of Service: 03/05/2021 8:30 AM Medical Record Number: [id number] Patient Account Number: [id number] Date of Birth/Sex: [birth year] (87 y.o. F) Treating RN: Kristin [name] Primary Care Durward Matranga: Kristin [name] Kristin [name]: Kristin Dwan Hemmelgarn: Kristin [name] Kristin Ezrah Dembeck/Extender: Kristin [name] in Treatment: 8 Encounter Discharge Information Items Discharge Condition: Stable Ambulatory Status: Cane Discharge Destination: Home Transportation: Private Auto Accompanied By: self Schedule Follow-up Appointment: Yes Clinical Summary of Care: Electronic Signature(s) Signed: 03/05/2021 4:28:40 PM By: Kristin [name] Entered ByDonnamarie [name] on 03/05/2021 09:21:59 [name], [name]  ([id number]) -------------------------------------------------------------------------------- Lower Extremity Assessment Details Patient Name: Kristin [name], [name] Date of Service: 03/05/2021 8:30 AM Medical Record Number: [id number] Patient Account Number: [id number] Date of Birth/Sex: December 13, 1929 (87 y.o. F) Treating RN: Kristin [name] Primary Care Alecia Doi: Kristin [name] Kristin [name]: Kristin Olivene Cookston: Kristin [name] Kristin Nevayah Faust/Extender: Kristin [name] in Treatment: 8 Edema Assessment Assessed: [Left: Yes] [Right: No] Edema: [Left: N] [Right: o] Calf Left: Right: Point of Measurement: 33 cm From Medial Instep 33 cm Ankle Left: Right: Point of Measurement: 9 cm From Medial Instep 22.5 cm Knee To Floor Left: Right: From Medial Instep 44 cm Vascular Assessment Pulses: Dorsalis Pedis Palpable: [Left:Yes] Electronic Signature(s) Signed: 03/05/2021 4:28:40 PM By: Kristin [name] Entered ByDonnamarie [name] on 03/05/2021 09:10:11 Kristin [name], [name] ([id number]) -------------------------------------------------------------------------------- Multi Wound Chart Details Patient Name: Kristin [name], [name] Date of Service: 03/05/2021 8:30 AM Medical Record Number: [id number] Patient Account Number: [id number] Date of Birth/Sex: [birth year] (87 y.o. F) Treating RN: Kristin [name] Primary Care Tryone Kille: Kristin [name] Kristin [name]: Kristin Arijana Narayan: Kristin [name] Kristin Pollyanna Levay/Extender: Kristin [name] in Treatment: 8 Vital Signs Height(in): 30 Pulse(bpm): 48 Weight(lbs): 147 Blood Pressure(mmHg): 118/74 Body Mass Index(BMI): 23 Temperature(F): 98.2 Respiratory Rate(breaths/min): 18 Photos: [N/A:N/A] Wound Location: Left Malleolus N/A N/A Wounding Event: Gradually Appeared N/A N/A Primary Etiology: Neuropathic Ulcer-Non Diabetic N/A N/A Comorbid History: Cataracts, Chronic Obstructive N/A N/A Pulmonary Disease (COPD), Arrhythmia, Congestive  Heart Failure, Coronary Artery Disease, Hypertension, Hepatitis C, History of pressure wounds, Gout, Osteoarthritis, Received Chemotherapy, Received Radiation Date Acquired: 09/23/2020 N/A N/A Weeks of Treatment: 8 N/A N/A Wound Status: Open N/A N/A Wound Recurrence: Yes N/A N/A Measurements L x W x D (cm) 1.3x0.4x0.1 N/A N/A Area (cm) : 0.408 N/A N/A Volume (cm) : 0.041 N/A N/A % Reduction in Area: 13.40% N/A N/A % Reduction in Volume: 12.80% N/A N/A Classification: Full Thickness Without Exposed N/A N/A Support Structures Exudate Amount: Medium N/A N/A Exudate Type: Serosanguineous N/A N/A Exudate Color: red, brown N/A N/A Granulation Amount: Large (67-100%) N/A N/A Granulation Quality: Red, Pink N/A N/A Necrotic Amount: None Present (0%) N/A N/A Exposed Structures: Fat Layer (Subcutaneous Tissue): N/A N/A Yes Fascia: No Tendon: No Muscle: No Joint: No Bone: No Epithelialization: Large (67-100%) N/A N/A Treatment Notes Electronic Signature(s) Kristin [name], [name] ([id number]) Signed: 03/05/2021  4:28:40 PM By: Kristin [name] Entered ByDonnamarie [name] on 03/05/2021 08:49:19 Kristin [name], [name] ([id number]) -------------------------------------------------------------------------------- [place] Details Patient Name: Kristin [name], [name] Date of Service: 03/05/2021 8:30 AM Medical Record Number: [id number] Patient Account Number: [id number] Date of Birth/Sex: September 01, 1929 (87 y.o. F) Treating RN: Kristin [name] Primary Care Mckinze Poirier: Kristin [name] Kristin [name]: Kristin Kella Splinter: Kristin [name] Kristin Jaice Digioia/Extender: Kristin [name] in Treatment: 8 Active Inactive Pressure Nursing Diagnoses: Knowledge deficit related to causes and risk factors for pressure ulcer development Knowledge deficit related to management of pressures ulcers Potential for impaired tissue integrity related to pressure, friction, moisture, and shear Goals: Patient will  remain free from development of additional pressure ulcers Date Initiated: 02/26/2021 Target Resolution Date: 04/06/2021 Goal Status: Active Interventions: Assess: immobility, friction, shearing, incontinence upon admission and as needed Notes: Electronic Signature(s) Signed: 03/05/2021 4:28:40 PM By: Kristin [name] Entered ByDonnamarie [name] on 03/05/2021 08:48:34 Kristin [name], Denetria L. ([id number]) -------------------------------------------------------------------------------- Pain Assessment Details Patient Name: Minerd, Lahari L. Date of Service: 03/05/2021 8:30 AM Medical Record Number: [id number] Patient Account Number: [id number] Date of Birth/Sex: [birth year] (87 y.o. F) Treating RN: Kristin [name] Primary Care Ayza Ripoll: Kristin [name] Kristin [name]: Kristin Marialice Newkirk: Kristin [name] Kristin Bowe Sidor/Extender: Kristin [name] in Treatment: 8 Active Problems Location of Pain Severity and Description of Pain Patient Has Paino No Site Locations Rate the pain. Current Pain Level: 0 Pain Management and Medication Current Pain Management: Electronic Signature(s) Signed: 03/05/2021 4:28:40 PM By: Kristin [name] Entered By: Kristin [name] on 03/05/2021 08:42:00 Anstey, Guynell L. ([id number]) -------------------------------------------------------------------------------- Patient/Caregiver Education Details Patient Name: Messenger, Yolette L. Date of Service: 03/05/2021 8:30 AM Medical Record Number: [id number] Patient Account Number: [id number] Date of Birth/Gender: 06-Mar-1930 (87 y.o. F) Treating RN: Kristin [name] Primary Care Physician: Kristin [name] Kristin [name]: Kristin Physician: Kristin [name] Kristin Physician/Extender: Kristin [name] in Treatment: 8 Education Assessment Education Provided To: Patient Education Topics Provided Basic Hygiene: Offloading: Wound/Skin Impairment: Electronic Signature(s) Signed: 03/05/2021 4:28:40 PM By: Kristin [name] Entered ByDonnamarie [name] on 03/05/2021 08:49:35 Brosch, Arian L. ([id number]) -------------------------------------------------------------------------------- Wound Assessment Details Patient Name: Placide, Cylah L. Date of Service: 03/05/2021 8:30 AM Medical Record Number: [id number] Patient Account Number: [id number] Date of Birth/Sex: [birth year] (87 y.o. F) Treating RN: Kristin [name] Primary Care Hadley Soileau: Kristin [name] Kristin [name]: Kristin Kelten Enochs: Kristin [name] Kristin Isham Smitherman/Extender: Kristin [name] in Treatment: 8 Wound Status Wound Number: 1R Primary Neuropathic Ulcer-Non Diabetic Etiology: Wound Location: Left Malleolus Wound Open Wounding Event: Gradually Appeared Status: Date Acquired: 09/23/2020 Comorbid Cataracts, Chronic Obstructive Pulmonary Disease (COPD), Weeks Of Treatment: 8 History: Arrhythmia, Congestive Heart Failure, Coronary Artery Clustered Wound: No Disease, Hypertension, Hepatitis C, History of pressure wounds, Gout, Osteoarthritis, Received Chemotherapy, Received Radiation Photos Wound Measurements Length: (cm) 1.3 Width: (cm) 0.4 Depth: (cm) 0.1 Area: (cm) 0.408 Volume: (cm) 0.041 % Reduction in Area: 13.4% % Reduction in Volume: 12.8% Epithelialization: Large (67-100%) Tunneling: No Undermining: No Wound Description Classification: Full Thickness Without Exposed Support Structu Exudate Amount: Medium Exudate Type: Serosanguineous Exudate Color: red, brown res Foul Odor After Cleansing: No Slough/Fibrino No Wound Bed Granulation Amount: Large (67-100%) Exposed Structure Granulation Quality: Red, Pink Fascia Exposed: No Necrotic Amount: None Present (0%) Fat Layer (Subcutaneous Tissue) Exposed: Yes Tendon Exposed: No Muscle Exposed: No Joint Exposed: No Bone Exposed: No Treatment Notes Wound #1R (Malleolus) Wound Laterality: Left Cleanser Soap and Water Discharge Instruction: Gently cleanse wound with antibacterial soap,  rinse and pat dry prior to dressing wounds Engelhard, Sundai L. ([id number]) Peri-Wound Care Topical  Primary Dressing Hydrofera Blue Ready Transfer Foam, 2.5x2.5 (in/in) Discharge Instruction: Apply Hydrofera Blue Ready to wound bed as directed Secondary Dressing ABD Pad 5x9 (in/in) Discharge Instruction: Cover with ABD pad Secured With Compression Wrap Profore Lite LF 3 Multilayer Compression [place] Discharge Instruction: Apply 3 multi-layer wrap as prescribed. CoFlex TLC Lite 2Layer Compression System, 25 to 30 mmHg Compression Stockings Circaid Juxta Lite Compression Wrap Quantity: 1 Left Leg Compression Amount: 30-40 mmHg Right Leg Compression Amount: 30-40 mmHg Discharge Instruction: Apply Circaid Juxta Lite Compression Wrap as directed Add-Ons Electronic Signature(s) Signed: 03/05/2021 4:28:40 PM By: Kristin [name] Entered ByDonnamarie [name] on 03/05/2021 08:46:46 [name], Clotine L. ([id number]) -------------------------------------------------------------------------------- Vitals Details Patient Name: Alemany, Taylore L. Date of Service: 03/05/2021 8:30 AM Medical Record Number: [id number] Patient Account Number: [id number] Date of Birth/Sex: 06/17/1930 (87 y.o. F) Treating RN: Kristin [name] Primary Care Saniah Schroeter: Kristin [name] Kristin [name]: Kristin Jaslin Novitski: Kristin [name] Kristin Arayla Kruschke/Extender: Kristin [name] in Treatment: 8 Vital Signs Time Taken: 08:41 Temperature (F): 98.2 Height (in): 67 Pulse (bpm): 74 Weight (lbs): 147 Respiratory Rate (breaths/min): 18 Body Mass Index (BMI): 23 Blood Pressure (mmHg): 118/74 Reference Range: 80 - 120 mg / dl Electronic Signature(s) Signed: 03/05/2021 4:28:40 PM By: Kristin [name] Entered ByDonnamarie [name] on 03/05/2021 08:41:50

## 2021-03-06 ENCOUNTER — Ambulatory Visit (INDEPENDENT_AMBULATORY_CARE_PROVIDER_SITE_OTHER): Payer: Medicare HMO | Admitting: Nurse Practitioner

## 2021-03-06 ENCOUNTER — Ambulatory Visit (INDEPENDENT_AMBULATORY_CARE_PROVIDER_SITE_OTHER): Payer: Medicare HMO

## 2021-03-06 ENCOUNTER — Other Ambulatory Visit: Payer: Self-pay

## 2021-03-06 VITALS — BP 127/72 | HR 81 | Ht 67.0 in | Wt 152.0 lb

## 2021-03-06 DIAGNOSIS — I739 Peripheral vascular disease, unspecified: Secondary | ICD-10-CM

## 2021-03-06 DIAGNOSIS — I129 Hypertensive chronic kidney disease with stage 1 through stage 4 chronic kidney disease, or unspecified chronic kidney disease: Secondary | ICD-10-CM

## 2021-03-06 DIAGNOSIS — N183 Chronic kidney disease, stage 3 unspecified: Secondary | ICD-10-CM | POA: Diagnosis not present

## 2021-03-06 DIAGNOSIS — I7025 Atherosclerosis of native arteries of other extremities with ulceration: Secondary | ICD-10-CM | POA: Diagnosis not present

## 2021-03-06 DIAGNOSIS — E785 Hyperlipidemia, unspecified: Secondary | ICD-10-CM

## 2021-03-06 NOTE — Progress Notes (Addendum)
Threat, Tonique L. ([id number]) Visit Report for 03/05/2021 Chief Complaint Document Details Patient Name: Kristin [name], [name] Date of Service: 03/05/2021 8:30 AM Medical Record Number: [id number] Patient Account Number: [id number] Date of Birth/Sex: [birth year] (87 y.o. F) Treating RN: Donnamarie Poag Primary Care Provider: [name] Other Clinician: Referring Provider: [name] Treating Provider/Extender: [name] in Treatment: 8 Information Obtained from: Patient Chief Complaint Left ankle and left foot ulcers Electronic Signature(s) Signed: 03/05/2021 9:28:31 AM By: Worthy Keeler PA-C Entered By: Worthy Keeler on 03/05/2021 09:28:31 Mott, Clotile L. ([id number]) -------------------------------------------------------------------------------- HPI Details Patient Name: Kristin [name], [name] Date of Service: 03/05/2021 8:30 AM Medical Record Number: [id number] Patient Account Number: [id number] Date of Birth/Sex: [birth year] (87 y.o. F) Treating RN: Donnamarie Poag Primary Care Provider: [name] Other Clinician: Referring Provider: [name] Treating Provider/Extender: [name] in Treatment: 8 History of Present Illness HPI Description: Left 0.81 Right 0.83 12/25/20 About a year but patient is not sure how long for sure 01/06/2021 upon evaluation today patient presents for initial inspection here in our clinic concerning issues she has been having in 2 areas one being on her left lateral malleolus. This is the most significant of the 2 regions based on what I am seeing today. The other is actually over her left dorsal 3rd toe. With that being said the patient tells me that currently she has been tolerating the dressing changes without complication. There does not appear to be any signs of obvious infection at this time but nonetheless she does not seem to be doing a lot better. She is mainly just been putting a cream on the areas but she  is not sure what cream this was when I questioned her about that today she states "it may be on my med list" although I did not see that. The patient is really not a very good historian in general concerning how long this has been going on and what exactly is going on. The patient does have a history of neuropathy, chronic kidney disease, atrial fibrillation and is on long-term anticoagulant therapy as well as aspirin. This includes Eliquis. 5/24; patient has an open area on the left lateral malleolus as well as an area on the base of her third toe which is mycotic also on the left. We have been using Hydrofera Blue as of last week. Biopsy that was done showed chronic stasis dermatitis with areas of healing erosion and overlying inflammatory crust. Negative for malignancy and negative for fungal organisms. X-ray of the foot did not show anything abnormal except degenerative changes 6/1; left lateral malleolus wound had a lot of callus and dry skin on the surface. I remove this there is only 2 small open areas. The base of the left third toe at the base of the mycotic nail also required debridement we have been using silver alginate in both areas 01/27/2021 upon evaluation today patient appears to be doing decently well in regard to her wounds. In fact she appears to be healed based on what I am seeing at this point. I do not see any signs of active infection at this time. Readmission: 02/26/2021 upon evaluation today patient unfortunately has a reopening of her wound on her leg. She has on the left lateral ankle noted wound has been open for about a week. She tells me that she has not been wearing compression since we discharged her labs under the impression that she had compression stockings and would be wearing those. Nonetheless it  does not appear to be the case. I do believe this may be very likely why this reopen. Fortunately there does not appear to be any signs of active infection at this time.  No fevers, chills, nausea, vomiting, or diarrhea. 03/05/2021 upon evaluation today patient appears to be doing well with regard to the wound on her ankle region. She has been tolerating the dressing changes without complication. Fortunately there is no evidence of active infection which is great news. Electronic Signature(s) Signed: 03/05/2021 5:36:10 PM By: Worthy Keeler PA-C Entered By: Worthy Keeler on 03/05/2021 17:36:10 Sansoucie, Keari L. ([id number]) -------------------------------------------------------------------------------- Physical Exam Details Patient Name: Kristin [name], [name] Date of Service: 03/05/2021 8:30 AM Medical Record Number: [id number] Patient Account Number: [id number] Date of Birth/Sex: 30-Nov-1929 (87 y.o. F) Treating RN: Donnamarie Poag Primary Care Provider: [name] Other Clinician: Referring Provider: [name] Treating Provider/Extender: [name] in Treatment: 8 Constitutional Well-nourished and well-hydrated in no acute distress. Respiratory normal breathing without difficulty. Psychiatric this patient is able to make decisions and demonstrates good insight into disease process. Alert and Oriented x 3. pleasant and cooperative. Notes I did have to perform sharp debridement clear away some of the necrotic debris from the surface of the wound and the patient tolerated this without complication also removed some of the callus and thickened tissue from around the edges of the wound. Post debridement wound bed appears to be doing significantly better I am very pleased with how the Hydrofera Blue seems to be helping with wound healing. Electronic Signature(s) Signed: 03/05/2021 5:36:32 PM By: Worthy Keeler PA-C Entered By: Worthy Keeler on 03/05/2021 17:36:32 Brissette, Dennisse L. ([id number]) -------------------------------------------------------------------------------- Physician Orders Details Patient Name: Manes, Natalyn L. Date of  Service: 03/05/2021 8:30 AM Medical Record Number: [id number] Patient Account Number: [id number] Date of Birth/Sex: [birth year] (87 y.o. F) Treating RN: Donnamarie Poag Primary Care Provider: [name] Other Clinician: Referring Provider: [name] Treating Provider/Extender: [name] in Treatment: 8 Verbal / Phone Orders: No Diagnosis Coding Follow-up Appointments o Return Appointment in 1 week. Bathing/ Shower/ Hygiene o May shower with wound dressing protected with water repellent cover or cast protector. o No tub bath. Edema Control - Lymphedema / Segmental Compressive Device / Other Left Lower Extremity o Optional: One layer of unna paste to top of compression wrap (to act as an anchor). o Elevate, Exercise Daily and Avoid Standing for Long Periods of Time. o Elevate legs to the level of the heart and pump ankles as often as possible o Elevate leg(s) parallel to the floor when sitting. Wound Treatment Wound #1R - Malleolus Wound Laterality: Left Cleanser: Soap and Water 1 x Per Week/30 Days Discharge Instructions: Gently cleanse wound with antibacterial soap, rinse and pat dry prior to dressing wounds Primary Dressing: Hydrofera Blue Ready Transfer Foam, 2.5x2.5 (in/in) 1 x Per Week/30 Days Discharge Instructions: Apply Hydrofera Blue Ready to wound bed as directed Secondary Dressing: ABD Pad 5x9 (in/in) 1 x Per Week/30 Days Discharge Instructions: Cover with ABD pad Compression Wrap: Profore Lite LF 3 Multilayer Compression Bandaging System (Generic) 1 x Per Week/30 Days Discharge Instructions: Apply 3 multi-layer wrap as prescribed. Compression Wrap: CoFlex TLC Lite 2Layer Compression System, 25 to 30 mmHg 1 x Per Week/30 Days Compression Stockings: Circaid Juxta Lite Compression Wrap (DME) Left Leg Compression Amount: 30-40 mmHG Right Leg Compression Amount: 30-40 mmHG Discharge Instructions: Apply Circaid Juxta Lite Compression Wrap  as directed Electronic Signature(s) Signed: 03/16/2021 11:05:33 AM By: Donnamarie Poag  Signed: 04/22/2021 5:26:53 PM By: Worthy Keeler PA-C Previous Signature: 03/05/2021 4:28:40 PM Version By: Donnamarie Poag Previous Signature: 03/05/2021 5:40:29 PM Version By: Worthy Keeler PA-C Entered By: Donnamarie Poag on 03/13/2021 11:53:07 Plaugher, Zaiyah L. ([id number]) -------------------------------------------------------------------------------- Problem List Details Patient Name: Lute, Lanecia L. Date of Service: 03/05/2021 8:30 AM Medical Record Number: [id number] Patient Account Number: [id number] Date of Birth/Sex: 05-16-1930 (87 y.o. F) Treating RN: Donnamarie Poag Primary Care Provider: [name] Other Clinician: Referring Provider: [name] Treating Provider/Extender: [name] in Treatment: 8 Active Problems ICD-10 Encounter Code Description Active Date MDM Diagnosis G90.09 Other idiopathic peripheral autonomic neuropathy 01/06/2021 No Yes L97.322 Non-pressure chronic ulcer of left ankle with fat layer exposed 01/06/2021 No Yes L97.522 Non-pressure chronic ulcer of other part of left foot with fat layer 01/06/2021 No Yes exposed N18.9 Chronic kidney disease, unspecified 01/06/2021 No Yes I48.0 Paroxysmal atrial fibrillation 01/06/2021 No Yes Z79.01 Long term (current) use of anticoagulants 01/06/2021 No Yes Inactive Problems Resolved Problems Electronic Signature(s) Signed: 03/05/2021 9:28:25 AM By: Worthy Keeler PA-C Entered By: Worthy Keeler on 03/05/2021 09:28:25 Klosinski, Kamala L. ([id number]) -------------------------------------------------------------------------------- Progress Note Details Patient Name: Kristin [name], [name] Date of Service: 03/05/2021 8:30 AM Medical Record Number: [id number] Patient Account Number: [id number] Date of Birth/Sex: 11/17/1929 (87 y.o. F) Treating RN: Donnamarie Poag Primary Care Provider: [name] Other Clinician: Referring  Provider: [name] Treating Provider/Extender: [name] in Treatment: 8 Subjective Chief Complaint Information obtained from Patient Left ankle and left foot ulcers History of Present Illness (HPI) Left 0.81 Right 0.83 12/25/20 About a year but patient is not sure how long for sure 01/06/2021 upon evaluation today patient presents for initial inspection here in our clinic concerning issues she has been having in 2 areas one being on her left lateral malleolus. This is the most significant of the 2 regions based on what I am seeing today. The other is actually over her left dorsal 3rd toe. With that being said the patient tells me that currently she has been tolerating the dressing changes without complication. There does not appear to be any signs of obvious infection at this time but nonetheless she does not seem to be doing a lot better. She is mainly just been putting a cream on the areas but she is not sure what cream this was when I questioned her about that today she states "it may be on my med list" although I did not see that. The patient is really not a very good historian in general concerning how long this has been going on and what exactly is going on. The patient does have a history of neuropathy, chronic kidney disease, atrial fibrillation and is on long-term anticoagulant therapy as well as aspirin. This includes Eliquis. 5/24; patient has an open area on the left lateral malleolus as well as an area on the base of her third toe which is mycotic also on the left. We have been using Hydrofera Blue as of last week. Biopsy that was done showed chronic stasis dermatitis with areas of healing erosion and overlying inflammatory crust. Negative for malignancy and negative for fungal organisms. X-ray of the foot did not show anything abnormal except degenerative changes 6/1; left lateral malleolus wound had a lot of callus and dry skin on the surface. I remove this  there is only 2 small open areas. The base of the left third toe at the base of the mycotic nail also required debridement we have  been using silver alginate in both areas 01/27/2021 upon evaluation today patient appears to be doing decently well in regard to her wounds. In fact she appears to be healed based on what I am seeing at this point. I do not see any signs of active infection at this time. Readmission: 02/26/2021 upon evaluation today patient unfortunately has a reopening of her wound on her leg. She has on the left lateral ankle noted wound has been open for about a week. She tells me that she has not been wearing compression since we discharged her labs under the impression that she had compression stockings and would be wearing those. Nonetheless it does not appear to be the case. I do believe this may be very likely why this reopen. Fortunately there does not appear to be any signs of active infection at this time. No fevers, chills, nausea, vomiting, or diarrhea. 03/05/2021 upon evaluation today patient appears to be doing well with regard to the wound on her ankle region. She has been tolerating the dressing changes without complication. Fortunately there is no evidence of active infection which is great news. Objective Constitutional Well-nourished and well-hydrated in no acute distress. Vitals Time Taken: 8:41 AM, Height: 67 in, Weight: 147 lbs, BMI: 23, Temperature: 98.2 F, Pulse: 74 bpm, Respiratory Rate: 18 breaths/min, Blood Pressure: 118/74 mmHg. Respiratory normal breathing without difficulty. Psychiatric Mersch, Nysha L. ([id number]) this patient is able to make decisions and demonstrates good insight into disease process. Alert and Oriented x 3. pleasant and cooperative. General Notes: I did have to perform sharp debridement clear away some of the necrotic debris from the surface of the wound and the patient tolerated this without complication also removed some of the  callus and thickened tissue from around the edges of the wound. Post debridement wound bed appears to be doing significantly better I am very pleased with how the Hydrofera Blue seems to be helping with wound healing. Integumentary (Hair, Skin) Wound #1R status is Open. Original cause of wound was Gradually Appeared. The date acquired was: 09/23/2020. The wound has been in treatment 8 weeks. The wound is located on the Left Malleolus. The wound measures 1.3cm length x 0.4cm width x 0.1cm depth; 0.408cm^2 area and 0.041cm^3 volume. There is Fat Layer (Subcutaneous Tissue) exposed. There is no tunneling or undermining noted. There is a medium amount of serosanguineous drainage noted. There is large (67-100%) red, pink granulation within the wound bed. There is no necrotic tissue within the wound bed. 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 There was a Three Layer Compression Therapy Procedure by Donnamarie Poag, RN. Post procedure Diagnosis Wound #1R: Same as Pre-Procedure Plan Follow-up Appointments: Return Appointment in 1 week. Bathing/ Shower/ Hygiene: May shower with wound dressing protected with water repellent cover or cast protector. No tub bath. Edema Control - Lymphedema / Segmental Compressive Device / Other: Optional: One layer of unna paste to top of compression wrap (to act as an anchor). Elevate, Exercise Daily and Avoid Standing for Long Periods of Time. Elevate legs to the level of the heart and pump ankles as often as possible Elevate leg(s) parallel to the floor  when sitting.  WOUND #1R: - Malleolus Wound Laterality: Left Cleanser: Soap and Water 1 x Per Week/30 Days Discharge Instructions: Gently cleanse wound with antibacterial soap, rinse and pat dry prior to dressing wounds Primary Dressing: Hydrofera Blue Ready Transfer Foam, 2.5x2.5 (in/in) 1 x Per Week/30 Days Discharge Instructions: Apply Hydrofera Blue Ready to wound bed as directed Secondary Dressing: ABD Pad 5x9 (in/in) 1 x Per Week/30 Days Discharge Instructions: Cover with ABD pad Compression Wrap: Profore Lite LF 3 Multilayer Compression Bandaging System (Generic) 1 x Per Week/30 Days Discharge Instructions: Apply 3 multi-layer wrap as prescribed. Compression Wrap: CoFlex TLC Lite 2Layer Compression System, 25 to 30 mmHg 1 x Per Week/30 Days Compression Stockings: Circaid Juxta Lite Compression Wrap (DME) Compression Amount: 30-40 mmHg (left) Compression Amount: 30-40 mmHg (right) Discharge Instructions: Apply Circaid Juxta Lite Compression Wrap as directed Spikes, Louvenia L. ([id number]) 1. I would recommend that we going to continue with wound care measures as before and the patient is in agreement with the plan. This includes the use of the [facility] which I think is actually doing a good job for the patient currently. 2. I am also can recommend we continue with an ABD pad to cover which I think is doing good to help control the drainage. 3. I am also can recommend that we continue with 3 layer compression wrap which seems to be doing well with using the Coflex right now in the 20 to 30 mmHg range. We will see patient back for reevaluation in 1 week here in the clinic. If anything worsens or changes patient will contact our office for additional recommendations. Electronic Signature(s) Signed: 03/05/2021 5:37:11 PM By: Worthy Keeler PA-C Entered By: Worthy Keeler on 03/05/2021 17:37:11 Morr, Sheronica L.  ([id number]) -------------------------------------------------------------------------------- SuperBill Details Patient Name: Kristin [name], [name] Date of Service: 03/05/2021 Medical Record Number: [id number] Patient Account Number: [id number] Date of Birth/Sex: 08/18/1930 (87 y.o. F) Treating RN: Donnamarie Poag Primary Care Provider: [name] Other Clinician: Referring Provider: [name] Treating Provider/Extender: [name] in Treatment: 8 Diagnosis Coding ICD-10 Codes Code Description G90.09 Other idiopathic peripheral autonomic neuropathy L97.322 Non-pressure chronic ulcer of left ankle with fat layer exposed L97.522 Non-pressure chronic ulcer of other part of left foot with fat layer exposed N18.9 Chronic kidney disease, unspecified I48.0 Paroxysmal atrial fibrillation Z79.01 Long term (current) use of anticoagulants Facility Procedures CPT4 Code: [id number] Description: (Facility Use Only) [phone] - [place] LWR LT LEG Modifier: Quantity: 1 Physician Procedures CPT4 Code: [id number] Description: 25638 - WC PHYS LEVEL 3 - EST PT Modifier: Quantity: 1 CPT4 Code: Description: ICD-10 Diagnosis Description G90.09 Other idiopathic peripheral autonomic neuropathy L97.322 Non-pressure chronic ulcer of left ankle with fat layer exposed L97.522 Non-pressure chronic ulcer of other part of left foot with fat layer ex  N18.9 Chronic kidney disease, unspecified Modifier: posed Quantity: Electronic Signature(s) Signed: 03/05/2021 5:37:32 PM By: Worthy Keeler PA-C Previous Signature: 03/05/2021 4:28:40 PM Version By: Donnamarie Poag Entered By: Worthy Keeler on 03/05/2021 17:37:32

## 2021-03-07 LAB — TOXASSURE SELECT 13 (MW), URINE

## 2021-03-10 ENCOUNTER — Encounter: Payer: Self-pay | Admitting: Pain Medicine

## 2021-03-10 DIAGNOSIS — J45998 Other asthma: Secondary | ICD-10-CM | POA: Diagnosis not present

## 2021-03-12 DIAGNOSIS — J45998 Other asthma: Secondary | ICD-10-CM | POA: Diagnosis not present

## 2021-03-13 ENCOUNTER — Other Ambulatory Visit: Payer: Self-pay

## 2021-03-13 ENCOUNTER — Encounter: Payer: Medicare HMO | Admitting: Physician Assistant

## 2021-03-13 DIAGNOSIS — G9009 Other idiopathic peripheral autonomic neuropathy: Secondary | ICD-10-CM | POA: Diagnosis not present

## 2021-03-13 DIAGNOSIS — L97322 Non-pressure chronic ulcer of left ankle with fat layer exposed: Secondary | ICD-10-CM | POA: Diagnosis not present

## 2021-03-13 NOTE — Progress Notes (Addendum)
[name], [name] ([id number]) Visit Report for 03/13/2021 Chief Complaint Document Details Patient Name: Kristin [name], [name] Date of Service: 03/13/2021 11:15 AM Medical Record Number: [id number] Patient Account Number: [id number] Date of Birth/Sex: 1930-01-13 (87 y.o. F) Treating RN: Donnamarie Poag Primary Care Provider: [name] Other Clinician: Referring Provider: [name] Treating Provider/Extender: [name] in Treatment: 9 Information Obtained from: Patient Chief Complaint Left ankle and left foot ulcers Electronic Signature(s) Signed: 03/13/2021 11:38:07 AM By: Worthy Keeler PA-C Entered By: Worthy Keeler on 03/13/2021 11:38:07 Kristin [name], [name] ([id number]) -------------------------------------------------------------------------------- HPI Details Patient Name: [name], [name] Date of Service: 03/13/2021 11:15 AM Medical Record Number: [id number] Patient Account Number: [id number] Date of Birth/Sex: [birth year] (87 y.o. F) Treating RN: Donnamarie Poag Primary Care Provider: [name] Other Clinician: Referring Provider: [name] Treating Provider/Extender: [name] in Treatment: 9 History of Present Illness HPI Description: Left 0.81 Right 0.83 12/25/20 About a year but patient is not sure how long for sure 01/06/2021 upon evaluation today patient presents for initial inspection here in our clinic concerning issues she has been having in 2 areas one being on her left lateral malleolus. This is the most significant of the 2 regions based on what I am seeing today. The other is actually over her left dorsal 3rd toe. With that being said the patient tells me that currently she has been tolerating the dressing changes without complication. There does not appear to be any signs of obvious infection at this time but nonetheless she does not seem to be doing a lot better. She is mainly just been putting a cream on the areas but  she is not sure what cream this was when I questioned her about that today she states "it may be on my med list" although I did not see that. The patient is really not a very good historian in general concerning how long this has been going on and what exactly is going on. The patient does have a history of neuropathy, chronic kidney disease, atrial fibrillation and is on long-term anticoagulant therapy as well as aspirin. This includes Eliquis. 5/24; patient has an open area on the left lateral malleolus as well as an area on the base of her third toe which is mycotic also on the left. We have been using Hydrofera Blue as of last week. Biopsy that was done showed chronic stasis dermatitis with areas of healing erosion and overlying inflammatory crust. Negative for malignancy and negative for fungal organisms. X-ray of the foot did not show anything abnormal except degenerative changes 6/1; left lateral malleolus wound had a lot of callus and dry skin on the surface. I remove this there is only 2 small open areas. The base of the left third toe at the base of the mycotic nail also required debridement we have been using silver alginate in both areas 01/27/2021 upon evaluation today patient appears to be doing decently well in regard to her wounds. In fact she appears to be healed based on what I am seeing at this point. I do not see any signs of active infection at this time. Readmission: 02/26/2021 upon evaluation today patient unfortunately has a reopening of her wound on her leg. She has on the left lateral ankle noted wound has been open for about a week. She tells me that she has not been wearing compression since we discharged her labs under the impression that she had compression stockings and would be wearing those. Nonetheless it  does not appear to be the case. I do believe this may be very likely why this reopen. Fortunately there does not appear to be any signs of active infection at this  time. No fevers, chills, nausea, vomiting, or diarrhea. 03/05/2021 upon evaluation today patient appears to be doing well with regard to the wound on her ankle region. She has been tolerating the dressing changes without complication. Fortunately there is no evidence of active infection which is great news. 03/13/2021 upon evaluation today patient appears to be doing well with regard to her wound. She has been tolerating the dressing changes without complication and very pleased in that regard. There does not appear to be any signs of active infection which is great news and overall I think the patient is making excellent progress. Electronic Signature(s) Signed: 03/13/2021 6:24:26 PM By: Worthy Keeler PA-C Entered By: Worthy Keeler on 03/13/2021 18:24:26 Kristin [name], [name] ([id number]) -------------------------------------------------------------------------------- Physical Exam Details Patient Name: [name], [name] Date of Service: 03/13/2021 11:15 AM Medical Record Number: [id number] Patient Account Number: [id number] Date of Birth/Sex: 1930/06/15 (87 y.o. F) Treating RN: Donnamarie Poag Primary Care Provider: [name] Other Clinician: Referring Provider: [name] Treating Provider/Extender: [name] in Treatment: 9 Constitutional Well-nourished and well-hydrated in no acute distress. Respiratory normal breathing without difficulty. Psychiatric this patient is able to make decisions and demonstrates good insight into disease process. Alert and Oriented x 3. pleasant and cooperative. Notes Patient's wound bed showed signs of good granulation epithelization at this point. I do not see any evidence of active infection which is great and overall I am extremely pleased with where we stand today. Electronic Signature(s) Signed: 03/13/2021 6:24:52 PM By: Worthy Keeler PA-C Entered By: Worthy Keeler on 03/13/2021 18:24:51 Kristin [name], [name]  ([id number]) -------------------------------------------------------------------------------- Physician Orders Details Patient Name: Kristin [name], [name] Date of Service: 03/13/2021 11:15 AM Medical Record Number: [id number] Patient Account Number: [id number] Date of Birth/Sex: 28-Jun-1930 (87 y.o. F) Treating RN: Donnamarie Poag Primary Care Provider: [name] Other Clinician: Referring Provider: [name] Treating Provider/Extender: [name] in Treatment: 9 Verbal / Phone Orders: No Diagnosis Coding ICD-10 Coding Code Description G90.09 Other idiopathic peripheral autonomic neuropathy L97.322 Non-pressure chronic ulcer of left ankle with fat layer exposed L97.522 Non-pressure chronic ulcer of other part of left foot with fat layer exposed N18.9 Chronic kidney disease, unspecified I48.0 Paroxysmal atrial fibrillation Z79.01 Long term (current) use of anticoagulants Follow-up Appointments o Return Appointment in 1 week. Bathing/ Shower/ Hygiene o May shower with wound dressing protected with water repellent cover or cast protector. o No tub bath. Edema Control - Lymphedema / Segmental Compressive Device / Other Left Lower Extremity o Optional: One layer of unna paste to top of compression wrap (to act as an anchor). o Elevate, Exercise Daily and Avoid Standing for Long Periods of Time. o Elevate legs to the level of the heart and pump ankles as often as possible o Elevate leg(s) parallel to the floor when sitting. Wound Treatment Wound #1R - Malleolus Wound Laterality: Left Cleanser: Soap and Water 1 x Per Week/30 Days Discharge Instructions: Gently cleanse wound with antibacterial soap, rinse and pat dry prior to dressing wounds Primary Dressing: Hydrofera Blue Ready Transfer Foam, 2.5x2.5 (in/in) 1 x Per Week/30 Days Discharge Instructions: Apply Hydrofera Blue Ready to wound bed as directed Secondary Dressing: ABD Pad 5x9 (in/in) 1 x Per  Week/30 Days Discharge Instructions: Cover with ABD pad Compression Wrap: Profore Lite LF 3 Multilayer Compression  Bandaging System (Generic) 1 x Per Week/30 Days Discharge Instructions: Apply 3 multi-layer wrap as prescribed. Compression Wrap: CoFlex TLC Lite 2Layer Compression System, 25 to 30 mmHg 1 x Per Week/30 Days Compression Stockings: Circaid Juxta Lite Compression Wrap Left Leg Compression Amount: 30-40 mmHG Right Leg Compression Amount: 30-40 mmHG Discharge Instructions: Apply Circaid Juxta Lite Compression Wrap as directed Electronic Signature(s) Signed: 03/13/2021 6:24:05 PM By: Donnamarie Poag Signed: 03/13/2021 6:45:55 PM By: Worthy Keeler PA-C Entered By: Donnamarie Poag on 03/13/2021 11:55:54 Kristin [name], [name] ([id number]) -------------------------------------------------------------------------------- Problem List Details Patient Name: Kristin [name], [name] Date of Service: 03/13/2021 11:15 AM Medical Record Number: [id number] Patient Account Number: [id number] Date of Birth/Sex: 1930-04-05 (87 y.o. F) Treating RN: Donnamarie Poag Primary Care Provider: [name] Other Clinician: Referring Provider: [name] Treating Provider/Extender: [name] in Treatment: 9 Active Problems ICD-10 Encounter Code Description Active Date MDM Diagnosis G90.09 Other idiopathic peripheral autonomic neuropathy 01/06/2021 No Yes L97.322 Non-pressure chronic ulcer of left ankle with fat layer exposed 01/06/2021 No Yes L97.522 Non-pressure chronic ulcer of other part of left foot with fat layer 01/06/2021 No Yes exposed N18.9 Chronic kidney disease, unspecified 01/06/2021 No Yes I48.0 Paroxysmal atrial fibrillation 01/06/2021 No Yes Z79.01 Long term (current) use of anticoagulants 01/06/2021 No Yes Inactive Problems Resolved Problems Electronic Signature(s) Signed: 03/13/2021 11:37:59 AM By: Worthy Keeler PA-C Entered By: Worthy Keeler on 03/13/2021 11:37:59 Kristin [name], [name] ([id number]) -------------------------------------------------------------------------------- Progress Note Details Patient Name: Kristin [name], [name] Date of Service: 03/13/2021 11:15 AM Medical Record Number: [id number] Patient Account Number: [id number] Date of Birth/Sex: 05-06-1930 (87 y.o. F) Treating RN: Donnamarie Poag Primary Care Provider: [name] Other Clinician: Referring Provider: [name] Treating Provider/Extender: [name] in Treatment: 9 Subjective Chief Complaint Information obtained from Patient Left ankle and left foot ulcers History of Present Illness (HPI) Left 0.81 Right 0.83 12/25/20 About a year but patient is not sure how long for sure 01/06/2021 upon evaluation today patient presents for initial inspection here in our clinic concerning issues she has been having in 2 areas one being on her left lateral malleolus. This is the most significant of the 2 regions based on what I am seeing today. The other is actually over her left dorsal 3rd toe. With that being said the patient tells me that currently she has been tolerating the dressing changes without complication. There does not appear to be any signs of obvious infection at this time but nonetheless she does not seem to be doing a lot better. She is mainly just been putting a cream on the areas but she is not sure what cream this was when I questioned her about that today she states "it may be on my med list" although I did not see that. The patient is really not a very good historian in general concerning how long this has been going on and what exactly is going on. The patient does have a history of neuropathy, chronic kidney disease, atrial fibrillation and is on long-term anticoagulant therapy as well as aspirin. This includes Eliquis. 5/24; patient has an open area on the left lateral malleolus as well as an area on the base of her third toe which is mycotic also on the left.  We have been using Hydrofera Blue as of last week. Biopsy that was done showed chronic stasis dermatitis with areas of healing erosion and overlying inflammatory crust. Negative for malignancy and negative for fungal organisms. X-ray of the foot did not  show anything abnormal except degenerative changes 6/1; left lateral malleolus wound had a lot of callus and dry skin on the surface. I remove this there is only 2 small open areas. The base of the left third toe at the base of the mycotic nail also required debridement we have been using silver alginate in both areas 01/27/2021 upon evaluation today patient appears to be doing decently well in regard to her wounds. In fact she appears to be healed based on what I am seeing at this point. I do not see any signs of active infection at this time. Readmission: 02/26/2021 upon evaluation today patient unfortunately has a reopening of her wound on her leg. She has on the left lateral ankle noted wound has been open for about a week. She tells me that she has not been wearing compression since we discharged her labs under the impression that she had compression stockings and would be wearing those. Nonetheless it does not appear to be the case. I do believe this may be very likely why this reopen. Fortunately there does not appear to be any signs of active infection at this time. No fevers, chills, nausea, vomiting, or diarrhea. 03/05/2021 upon evaluation today patient appears to be doing well with regard to the wound on her ankle region. She has been tolerating the dressing changes without complication. Fortunately there is no evidence of active infection which is great news. 03/13/2021 upon evaluation today patient appears to be doing well with regard to her wound. She has been tolerating the dressing changes without complication and very pleased in that regard. There does not appear to be any signs of active infection which is great news and overall I think  the patient is making excellent progress. Objective Constitutional Well-nourished and well-hydrated in no acute distress. Vitals Time Taken: 11:23 AM, Height: 67 in, Weight: 147 lbs, BMI: 23, Temperature: 97.7 F, Pulse: 80 bpm, Respiratory Rate: 18 breaths/min, Blood Pressure: 143/78 mmHg. Kristin [name], [name] ([id number]) Respiratory normal breathing without difficulty. Psychiatric this patient is able to make decisions and demonstrates good insight into disease process. Alert and Oriented x 3. pleasant and cooperative. General Notes: Patient's wound bed showed signs of good granulation epithelization at this point. I do not see any evidence of active infection which is great and overall I am extremely pleased with where we stand today. Integumentary (Hair, Skin) Wound #1R status is Open. Original cause of wound was Gradually Appeared. The date acquired was: 09/23/2020. The wound has been in treatment 9 weeks. The wound is located on the Left Malleolus. The wound measures 1cm length x 0.4cm width x 0.2cm depth; 0.314cm^2 area and 0.063cm^3 volume. There is Fat Layer (Subcutaneous Tissue) exposed. There is no tunneling or undermining noted. There is a medium amount of serosanguineous drainage noted. There is large (67-100%) red, pink granulation within the wound bed. There is no necrotic tissue within the wound bed. 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 There was a Three Layer Compression Therapy Procedure by Donnamarie Poag, RN. Post procedure Diagnosis Wound #1R: Same as  Pre-Procedure Plan Follow-up Appointments: Return Appointment in 1 week. Bathing/ Shower/ Hygiene: May shower with wound  dressing protected with water repellent cover or cast protector. No tub bath. Edema Control - Lymphedema / Segmental Compressive Device / Other: Optional: One layer of unna paste to top of compression wrap (to act as an anchor). Elevate, Exercise Daily and Avoid Standing for Long Periods of Time. Elevate legs to the level of the heart and pump ankles as often as possible Elevate leg(s) parallel to the floor when sitting. WOUND #1R: - Malleolus Wound Laterality: Left Cleanser: Soap and Water 1 x Per Week/30 Days Discharge Instructions: Gently cleanse wound with antibacterial soap, rinse and pat dry prior to dressing wounds Primary Dressing: Hydrofera Blue Ready Transfer Foam, 2.5x2.5 (in/in) 1 x Per Week/30 Days Discharge Instructions: Apply Hydrofera Blue Ready to wound bed as directed Secondary Dressing: ABD Pad 5x9 (in/in) 1 x Per Week/30 Days Discharge Instructions: Cover with ABD pad Compression Wrap: Profore Lite LF 3 Multilayer Compression Bandaging System (Generic) 1 x Per Week/30 Days Discharge Instructions: Apply 3 multi-layer wrap as prescribed. Compression Wrap: CoFlex TLC Lite 2Layer Compression System, 25 to 30 mmHg 1 x Per Week/30 Days Compression Stockings: Circaid Juxta Lite Compression Wrap Compression Amount: 30-40 mmHg (left) Compression Amount: 30-40 mmHg (right) Discharge Instructions: Apply Circaid Juxta Lite Compression Wrap as directed Kristin [name], [name] ([id number]) 1. I am going to recommend currently that we go ahead and continue with the wound care measures specifically in regard to the [facility] dressing which I think is doing a great job. 2. I am also going to recommend ABD pad be continued as well as a 3 layer compression wrap. We will see patient back for reevaluation in 1 week here in the clinic. If anything worsens or changes  patient will contact our office for additional recommendations. Electronic Signature(s) Signed: 03/13/2021 6:25:22 PM By: Worthy Keeler PA-C Entered By: Worthy Keeler on 03/13/2021 18:25:22 Kristin [name], [name] ([id number]) -------------------------------------------------------------------------------- SuperBill Details Patient Name: Kristin [name], Randal L. Date of Service: 03/13/2021 Medical Record Number: [id number] Patient Account Number: [id number] Date of Birth/Sex: Feb 05, 1930 (87 y.o. F) Treating RN: Donnamarie Poag Primary Care Provider: [name] Other Clinician: Referring Provider: [name] Treating Provider/Extender: [name] in Treatment: 9 Diagnosis Coding ICD-10 Codes Code Description G90.09 Other idiopathic peripheral autonomic neuropathy L97.322 Non-pressure chronic ulcer of left ankle with fat layer exposed L97.522 Non-pressure chronic ulcer of other part of left foot with fat layer exposed N18.9 Chronic kidney disease, unspecified I48.0 Paroxysmal atrial fibrillation Z79.01 Long term (current) use of anticoagulants Physician Procedures CPT4 Code: 1884166 Description: 99213 - WC PHYS LEVEL 3 - EST PT Modifier: Quantity: 1 CPT4 Code: Description: ICD-10 Diagnosis Description G90.09 Other idiopathic peripheral autonomic neuropathy L97.322 Non-pressure chronic ulcer of left ankle with fat layer exposed L97.522 Non-pressure chronic ulcer of other part of left foot with fat layer ex  N18.9 Chronic kidney disease, unspecified Modifier: posed Quantity: Electronic Signature(s) Signed: 03/13/2021 6:27:07 PM By: Worthy Keeler PA-C Entered By: Worthy Keeler on 03/13/2021 18:27:06

## 2021-03-14 NOTE — Progress Notes (Signed)
Rana, Manju L. ([id number]) Visit Report for 03/13/2021 Arrival Information Details Patient Name: Kristin [name], [name] Date of Service: 03/13/2021 11:15 AM Medical Record Number: [id number] Patient Account Number: [id number] Date of Birth/Sex: 1930/03/05 (87 y.o. F) Treating RN: Donnamarie Poag Primary Care [place] Brogden: [name] Other Clinician: Referring Sayde Lish: [name] Treating Tonatiuh Mallon/Extender: [name] in Treatment: 9 Visit Information History Since Last Visit Added or deleted any medications: No Patient Arrived: Kristin [name] Had a fall or experienced change in No Arrival Time: 11:20 activities of daily living that may affect Accompanied By: self risk of falls: Transfer Assistance: None Hospitalized since last visit: No Patient Identification Verified: Yes Has Dressing in Place as Prescribed: Yes Secondary Verification Process Completed: Yes Has Compression in Place as Prescribed: Yes Patient Requires Transmission-Based No Pain Present Now: Yes Precautions: Patient Has Alerts: Yes Patient Alerts: Patient on Blood Thinner ELIQUIS and ASPIRIN NOT diabetic ABI left 0.81 @vascular  ABI right 0.83 @vascular  Electronic Signature(s) Signed: 03/13/2021 6:24:05 PM By: Donnamarie Poag Entered ByDonnamarie Poag on 03/13/2021 11:22:02 Bevard, [name] ([id number]) -------------------------------------------------------------------------------- Compression Therapy Details Patient Name: Kristin [name], [name] Date of Service: 03/13/2021 11:15 AM Medical Record Number: [id number] Patient Account Number: [id number] Date of Birth/Sex: [birth year] (87 y.o. F) Treating RN: Donnamarie Poag Primary Care Perez Dirico: [name] Other Clinician: Referring Delmi Fulfer: [name] Treating Nayda Riesen/Extender: [name] in Treatment: 9 Compression Therapy Performed for Wound Assessment: Wound #1R Left Malleolus Performed By: [name], RN Compression Type:  Three Layer Post Procedure Diagnosis Same as Pre-procedure Electronic Signature(s) Signed: 03/13/2021 6:24:05 PM By: Donnamarie Poag Entered By: Donnamarie Poag on 03/13/2021 11:42:38 Marley, [name] ([id number]) -------------------------------------------------------------------------------- Encounter Discharge Information Details Patient Name: Kristin [name], [name] Date of Service: 03/13/2021 11:15 AM Medical Record Number: [id number] Patient Account Number: [id number] Date of Birth/Sex: January 31, 1930 (87 y.o. F) Treating RN: Donnamarie Poag Primary Care Damieon Armendariz: [name] Other Clinician: Referring Lezli Danek: [name] Treating Christella App/Extender: [name] in Treatment: 9 Encounter Discharge Information Items Discharge Condition: Stable Ambulatory Status: Cane Discharge Destination: Home Transportation: Private Auto Accompanied By: self Schedule Follow-up Appointment: Yes Clinical Summary of Care: Electronic Signature(s) Signed: 03/13/2021 6:24:05 PM By: Donnamarie Poag Entered ByDonnamarie Poag on 03/13/2021 12:09:48 Landin, [name] ([id number]) -------------------------------------------------------------------------------- Lower Extremity Assessment Details Patient Name: Kristin [name], [name] Date of Service: 03/13/2021 11:15 AM Medical Record Number: [id number] Patient Account Number: [id number] Date of Birth/Sex: 16-Feb-1930 (87 y.o. F) Treating RN: Donnamarie Poag Primary Care Janaisa Birkland: [name] Other Clinician: Referring Eladia Frame: [name] Treating Malayla Granberry/Extender: [name] in Treatment: 9 Edema Assessment Assessed: [Left: Yes] [Right: No] Edema: [Left: N] [Right: o] Calf Left: Right: Point of Measurement: 33 cm From Medial Instep 32 cm Ankle Left: Right: Point of Measurement: 9 cm From Medial Instep 22 cm Knee To Floor Left: Right: From Medial Instep 44 cm Vascular Assessment Pulses: Dorsalis Pedis Palpable:  [Left:Yes] Electronic Signature(s) Signed: 03/13/2021 6:24:05 PM By: Donnamarie Poag Entered ByDonnamarie Poag on 03/13/2021 11:45:35 Riera, [name] ([id number]) -------------------------------------------------------------------------------- Multi Wound Chart Details Patient Name: Kristin [name], [name] Date of Service: 03/13/2021 11:15 AM Medical Record Number: [id number] Patient Account Number: [id number] Date of Birth/Sex: May 16, 1930 (87 y.o. F) Treating RN: Donnamarie Poag Primary Care Kathan Kirker: [name] Other Clinician: Referring Virgene Tirone: [name] Treating Paetyn Pietrzak/Extender: [name] in Treatment: 9 Vital Signs Height(in): 67 Pulse(bpm): 80 Weight(lbs): 147 Blood Pressure(mmHg): 143/78 Body Mass Index(BMI): 23 Temperature(F): 97.7 Respiratory Rate(breaths/min): 18 Photos: [N/A:N/A] Wound Location: Left Malleolus N/A N/A Wounding Event: Gradually Appeared N/A  N/A Primary Etiology: Neuropathic Ulcer-Non Diabetic N/A N/A Comorbid History: Cataracts, Chronic Obstructive N/A N/A Pulmonary Disease (COPD), Arrhythmia, Congestive Heart Failure, Coronary Artery Disease, Hypertension, Hepatitis C, History of pressure wounds, Gout, Osteoarthritis, Received Chemotherapy, Received Radiation Date Acquired: 09/23/2020 N/A N/A Weeks of Treatment: 9 N/A N/A Wound Status: Open N/A N/A Wound Recurrence: Yes N/A N/A Measurements L x W x D (cm) 1x0.4x0.2 N/A N/A Area (cm) : 0.314 N/A N/A Volume (cm) : 0.063 N/A N/A % Reduction in Area: 33.30% N/A N/A % Reduction in Volume: -34.00% N/A N/A Classification: Full Thickness Without Exposed N/A N/A Support Structures Exudate Amount: Medium N/A N/A Exudate Type: Serosanguineous N/A N/A Exudate Color: red, brown N/A N/A Granulation Amount: Large (67-100%) N/A N/A Granulation Quality: Red, Pink N/A N/A Necrotic Amount: None Present (0%) N/A N/A Exposed Structures: Fat Layer (Subcutaneous Tissue): N/A N/A Yes Fascia:  No Tendon: No Muscle: No Joint: No Bone: No Epithelialization: None N/A N/A Treatment Notes Electronic Signature(s) Turski, Merritt L. ([id number]) Signed: 03/13/2021 6:24:05 PM By: Donnamarie Poag Entered ByDonnamarie Poag on 03/13/2021 11:30:51 Lapre, Desirae L. ([id number]) -------------------------------------------------------------------------------- Encampment Details Patient Name: Kristin [name], [name] Date of Service: 03/13/2021 11:15 AM Medical Record Number: [id number] Patient Account Number: [id number] Date of Birth/Sex: February 10, 1930 (87 y.o. F) Treating RN: Donnamarie Poag Primary Care Damone Fancher: [name] Other Clinician: Referring Quashaun Lazalde: [name] Treating Khyri Hinzman/Extender: [name] in Treatment: 9 Active Inactive Pressure Nursing Diagnoses: Knowledge deficit related to causes and risk factors for pressure ulcer development Knowledge deficit related to management of pressures ulcers Potential for impaired tissue integrity related to pressure, friction, moisture, and shear Goals: Patient will remain free from development of additional pressure ulcers Date Initiated: 02/26/2021 Target Resolution Date: 04/06/2021 Goal Status: Active Interventions: Assess: immobility, friction, shearing, incontinence upon admission and as needed Notes: Electronic Signature(s) Signed: 03/13/2021 6:24:05 PM By: Donnamarie Poag Entered ByDonnamarie Poag on 03/13/2021 11:30:33 Kristin [name], [name] ([id number]) -------------------------------------------------------------------------------- Pain Assessment Details Patient Name: Kristin [name], [name] Date of Service: 03/13/2021 11:15 AM Medical Record Number: [id number] Patient Account Number: [id number] Date of Birth/Sex: 05-04-1930 (87 y.o. F) Treating RN: Donnamarie Poag Primary Care Sura Canul: [name] Other Clinician: Referring Brenton Joines: [name] Treating Ennis Delpozo/Extender: [name] in  Treatment: 9 Active Problems Location of Pain Severity and Description of Pain Patient Has Paino Yes Site Locations Pain Location: Pain in Ulcers Rate the pain. Current Pain Level: 3 Pain Management and Medication Current Pain Management: Electronic Signature(s) Signed: 03/13/2021 6:24:05 PM By: Donnamarie Poag Entered By: Donnamarie Poag on 03/13/2021 11:24:18 Kristin [name], [name] ([id number]) -------------------------------------------------------------------------------- Wound Assessment Details Patient Name: Kristin [name], [name] Date of Service: 03/13/2021 11:15 AM Medical Record Number: [id number] Patient Account Number: [id number] Date of Birth/Sex: 03-29-1930 (87 y.o. F) Treating RN: Donnamarie Poag Primary Care Guinevere Stephenson: [name] Other Clinician: Referring Alejo Beamer: [name] Treating Madlynn Lundeen/Extender: [name] in Treatment: 9 Wound Status Wound Number: 1R Primary Neuropathic Ulcer-Non Diabetic Etiology: Wound Location: Left Malleolus Wound Open Wounding Event: Gradually Appeared Status: Date Acquired: 09/23/2020 Comorbid Cataracts, Chronic Obstructive Pulmonary Disease (COPD), Weeks Of Treatment: 9 History: Arrhythmia, Congestive Heart Failure, Coronary Artery Clustered Wound: No Disease, Hypertension, Hepatitis C, History of pressure wounds, Gout, Osteoarthritis, Received Chemotherapy, Received Radiation Photos Wound Measurements Length: (cm) 1 Width: (cm) 0.4 Depth: (cm) 0.2 Area: (cm) 0.314 Volume: (cm) 0.063 % Reduction in Area: 33.3% % Reduction in Volume: -34% Epithelialization: None Tunneling: No Undermining: No Wound Description Classification: Full Thickness Without Exposed Support Structu Exudate Amount: Medium  Exudate Type: Serosanguineous Exudate Color: red, brown res Foul Odor After Cleansing: No Slough/Fibrino No Wound Bed Granulation Amount: Large (67-100%) Exposed Structure Granulation Quality: Red, Pink Fascia Exposed:  No Necrotic Amount: None Present (0%) Fat Layer (Subcutaneous Tissue) Exposed: Yes Tendon Exposed: No Muscle Exposed: No Joint Exposed: No Bone Exposed: No Treatment Notes Wound #1R (Malleolus) Wound Laterality: Left Cleanser Soap and Water Discharge Instruction: Gently cleanse wound with antibacterial soap, rinse and pat dry prior to dressing wounds Hartog, [name] ([id number]) Peri-Wound Care Topical Primary Dressing Hydrofera Blue Ready Transfer Foam, 2.5x2.5 (in/in) Discharge Instruction: Apply Hydrofera Blue Ready to wound bed as directed Secondary Dressing ABD Pad 5x9 (in/in) Discharge Instruction: Cover with ABD pad Secured With Compression Wrap Profore Lite LF 3 Multilayer Compression [place] Discharge Instruction: Apply 3 multi-layer wrap as prescribed. CoFlex TLC Lite 2Layer Compression System, 25 to 30 mmHg Compression Stockings Circaid Juxta Lite Compression Wrap Quantity: 1 Left Leg Compression Amount: 30-40 mmHg Right Leg Compression Amount: 30-40 mmHg Discharge Instruction: Apply Circaid Juxta Lite Compression Wrap as directed Add-Ons Electronic Signature(s) Signed: 03/13/2021 6:24:05 PM By: Donnamarie Poag Entered ByDonnamarie Poag on 03/13/2021 11:29:05 Kristin [name], [name] ([id number]) -------------------------------------------------------------------------------- Vitals Details Patient Name: Masci, Ebony L. Date of Service: 03/13/2021 11:15 AM Medical Record Number: [id number] Patient Account Number: [id number] Date of Birth/Sex: Jan 01, 1930 (87 y.o. F) Treating RN: Donnamarie Poag Primary Care Fares Ramthun: [name] Other Clinician: Referring Janalynn Eder: [name] Treating Maddux Vanscyoc/Extender: [name] in Treatment: 9 Vital Signs Time Taken: 11:23 Temperature (F): 97.7 Height (in): 67 Pulse (bpm): 80 Weight (lbs): 147 Respiratory Rate (breaths/min): 18 Body Mass Index (BMI): 23 Blood Pressure (mmHg): 143/78 Reference Range:  80 - 120 mg / dl Electronic Signature(s) Signed: 03/13/2021 6:24:05 PM By: Donnamarie Poag Entered ByDonnamarie Poag on 03/13/2021 11:24:11

## 2021-03-16 ENCOUNTER — Encounter (INDEPENDENT_AMBULATORY_CARE_PROVIDER_SITE_OTHER): Payer: Self-pay | Admitting: Nurse Practitioner

## 2021-03-16 DIAGNOSIS — N189 Chronic kidney disease, unspecified: Secondary | ICD-10-CM | POA: Diagnosis not present

## 2021-03-16 DIAGNOSIS — L97522 Non-pressure chronic ulcer of other part of left foot with fat layer exposed: Secondary | ICD-10-CM | POA: Diagnosis not present

## 2021-03-16 DIAGNOSIS — L97322 Non-pressure chronic ulcer of left ankle with fat layer exposed: Secondary | ICD-10-CM | POA: Diagnosis not present

## 2021-03-16 DIAGNOSIS — Z7901 Long term (current) use of anticoagulants: Secondary | ICD-10-CM | POA: Diagnosis not present

## 2021-03-16 DIAGNOSIS — I872 Venous insufficiency (chronic) (peripheral): Secondary | ICD-10-CM | POA: Diagnosis not present

## 2021-03-16 DIAGNOSIS — I480 Paroxysmal atrial fibrillation: Secondary | ICD-10-CM | POA: Diagnosis not present

## 2021-03-16 DIAGNOSIS — G9009 Other idiopathic peripheral autonomic neuropathy: Secondary | ICD-10-CM | POA: Diagnosis not present

## 2021-03-16 NOTE — Progress Notes (Addendum)
Subjective:    [name], female    DOB: November 01, 1929, 88 y.o.   MRN: [id number] Chief Complaint  Patient presents with   Follow-up    3 mo [name]    Today's is an 87 year old female that presents today due to discomfort in her left lower extremity.  The patient previously had an angiogram done on this left lower extremity due to a nonhealing ulceration.  The patient also notes that the ulceration has returned somewhat.  Since her last office visit the wound had healed but about a week later it reopened.  The area is largely scabbed over.  The patient also notes some burning and stinging in her lower extremities which is what bothers her the most.  However she denies any new wounds or ulcerations.  She denies any claudication-like symptoms.  She denies rest pain.   Today noninvasive studies show an ABI of 0.91 on the right compared to her previous ABI 0.94.  We were unable to obtain ABIs on the left due to wound dressings.  However her TBI 0.70 which is increased from her previous TBI 0.48.  The patient has monophasic tibial artery waveforms on the right.  The left lower extremity did have an arterial duplex and it shows monophasic waveforms from the distal common femoral down to the proximal posterior tibial arteries.  Review of Systems  Skin:  Positive for wound.  Neurological:  Positive for weakness.  All other systems reviewed and are negative.     Objective:   Physical Exam Vitals reviewed.  HENT:     Head: Normocephalic.  Cardiovascular:     Rate and Rhythm: Normal rate.     Pulses: Decreased pulses.  Pulmonary:     Effort: Pulmonary effort is normal.  Neurological:     Mental Status: She is alert and oriented to person, place, and time.     Motor: Weakness present.     Gait: Gait abnormal.  Psychiatric:        Mood and Affect: Mood normal.        Behavior: Behavior normal.        Thought Content: Thought content normal.        Judgment: Judgment normal.     BP 127/72   Pulse 81   Ht 5\' 7"  (1.702 m)   Wt 152 lb (68.9 kg)   BMI 23.81 kg/m   Past Medical History:  Diagnosis Date   Acute postoperative pain 02/09/2018   Arrhythmia, sinus node 04/03/2014   Arthritis    Arthritis    Atrial fibrillation (HCC)    Back pain    lower back chronic   Bradycardia    Breast cancer ([place]) 2004   right breast cancer   Cancer ([place]) 2004   rt breast cancer-post lumpectomy- chemo/rad   CHF (congestive heart failure) (HCC)    Chronic back pain    CKD (chronic kidney disease)    COPD (chronic obstructive pulmonary disease) ([place])    wears O2 at 2L via [place] at night   Cystocele    Decubitus ulcers    Dehydration    Gout    Hematuria    Hepatitis C    Hyperlipidemia    Hypertension    Hypomagnesemia 06/25/2015   OAB (overactive bladder)    Overactive bladder    Restless leg    Shoulder pain, bilateral    Urinary frequency    UTI (lower urinary tract infection)    Vaginal atrophy  Social History   Socioeconomic History   Marital status: Single    Spouse name: Not on file   Number of children: 2   Years of education: 12   Highest education level: 12th grade  Occupational History   Occupation: retired  Tobacco Use   Smoking status: Former    Packs/day: 1.25    Years: 40.00    Pack years: 50.00    Types: Cigarettes    Quit date: 12/16/1998    Years since quitting: 22.2   Smokeless tobacco: Former  Scientific laboratory technician Use: Never used  Substance and Sexual Activity   Alcohol use: No    Alcohol/week: 0.0 standard drinks   Drug use: Yes    Types: Oxycodone   Sexual activity: Not Currently    Birth control/protection: Surgical  Other Topics Concern   Not on file  Social History Narrative   Not on file   Social Determinants of Health   Financial Resource Strain: Low Risk    Difficulty of Paying Living Expenses: Not hard at all  Food Insecurity: No Food Insecurity   Worried About Charity fundraiser in the Last Year:  Never true   [place] in the Last Year: Never true  Transportation Needs: No Transportation Needs   Lack of Transportation (Medical): No   Lack of Transportation (Non-Medical): No  Physical Activity: Inactive   Days of Exercise per Week: 0 days   Minutes of Exercise per Session: 0 min  Stress: No Stress Concern Present   Feeling of Stress : Not at all  Social Connections: Not on file  Intimate Partner Violence: Not on file    Past Surgical History:  Procedure Laterality Date   ABDOMINAL HYSTERECTOMY     BACK SURGERY     BREAST LUMPECTOMY Right 2004   BREAST SURGERY     rt lumpectomy   CARPAL TUNNEL RELEASE     CATARACT EXTRACTION W/PHACO  10/19/2011   Procedure: CATARACT EXTRACTION PHACO AND INTRAOCULAR LENS PLACEMENT (IOC);  Surgeon: Elta Guadeloupe T. Gershon Crane, MD;  Location: [facility];  Service: Ophthalmology;  Laterality: Right;  CDE:10.81   CATARACT EXTRACTION W/PHACO  11/02/2011   Procedure: CATARACT EXTRACTION PHACO AND INTRAOCULAR LENS PLACEMENT (IOC);  Surgeon: Elta Guadeloupe T. Gershon Crane, MD;  Location: [facility];  Service: Ophthalmology;  Laterality: Left;  CDE 8.60   CHOLECYSTECTOMY     3/18   COLON SURGERY     INSERT / REPLACE / REMOVE PACEMAKER     JOINT REPLACEMENT     bilateral TKA   LOWER EXTREMITY ANGIOGRAPHY Left 10/14/2020   Procedure: LOWER EXTREMITY ANGIOGRAPHY;  Surgeon: Katha Cabal, MD;  Location: [facility];  Service: Cardiovascular;  Laterality: Left;   PACEMAKER INSERTION N/A 12/24/2015   Procedure: INSERTION PACEMAKER;  Surgeon: Isaias Cowman, MD;  Location: [facility];  Service: Cardiovascular;  Laterality: N/A;   ROTATOR CUFF REPAIR     bilateral    Family History  Problem Relation Age of Onset   Kidney disease Brother    Heart disease Father    Heart disease Mother    Stroke Mother    Cancer Sister    Breast cancer Sister 15   Breast cancer Paternal Aunt    Anesthesia problems Neg Hx    Hypotension Neg Hx    Malignant hyperthermia Neg Hx     Pseudochol deficiency Neg Hx     Allergies  Allergen Reactions   Flexeril [Cyclobenzaprine Hcl]  Severe Rash   Vancomycin Rash    [place] Syndrome    CBC Latest Ref Rng & Units 11/07/2020 08/11/2020 08/05/2020  WBC 4.0 - 10.5 K/uL 7.4 5.0 9.1  Hemoglobin 12.0 - 15.0 g/dL 12.3 11.9(L) 11.9(L)  Hematocrit 36.0 - 46.0 % 39.7 40.2 38.8  Platelets 150 - 400 K/uL 173 141(L) 141(L)      CMP     Component Value Date/Time   NA 134 (L) 11/07/2020 2002   NA 138 09/06/2014 2215   K 4.2 11/07/2020 2002   K 4.4 09/06/2014 2215   CL 108 11/07/2020 2002   CL 106 09/06/2014 2215   CO2 19 (L) 11/07/2020 2002   CO2 23 09/06/2014 2215   GLUCOSE 114 (H) 11/07/2020 2002   GLUCOSE 92 09/06/2014 2215   BUN 31 (H) 11/07/2020 2002   BUN 25 (H) 09/06/2014 2215   CREATININE 1.43 (H) 11/07/2020 2002   CREATININE 1.09 (H) 10/02/2020 1106   CALCIUM 9.0 11/07/2020 2002   CALCIUM 9.0 09/06/2014 2215   PROT 7.0 11/07/2020 2002   PROT 7.1 07/10/2014 1219   ALBUMIN 3.7 11/07/2020 2002   ALBUMIN 3.2 (L) 07/10/2014 1219   AST 21 11/07/2020 2002   AST 26 07/10/2014 1219   ALT 16 11/07/2020 2002   ALT 15 07/10/2014 1219   ALKPHOS 55 11/07/2020 2002   ALKPHOS 69 07/10/2014 1219   BILITOT 0.6 11/07/2020 2002   BILITOT 0.4 07/10/2014 1219   GFRNONAA 35 (L) 11/07/2020 2002   GFRNONAA 45 (L) 10/02/2020 1106   GFRAA 52 (L) 10/02/2020 1106     VAS [name] ABI WITH/WO TBI  Result Date: 03/12/2021  LOWER EXTREMITY DOPPLER STUDY Patient Name:  CASSADEE VANZANDT  Date of Exam:   03/06/2021 Medical Rec #: [id number]      Accession #:    [id number] Date of Birth: 1930-03-20       Patient Gender: F Patient Age:   27Y Exam Location:  [facility] Procedure:      VAS [name] ABI WITH/WO TBI Referring Phys: [id number] GREGORY G SCHNIER --------------------------------------------------------------------------------  Indications: Peripheral artery disease.  Vascular Interventions: 10/14/2020: PTA of the Left SFA and  Popliteal Artery.                         PTA of the Left Posterior tibial Artery and                         Tibioperoneal trunk. PTA and Stent placement Left EIA. Comparison Study: 12/24/2020 Performing Technologist: Almira Coaster RVS  Examination Guidelines: A complete evaluation includes at minimum, Doppler waveform signals and systolic blood pressure reading at the level of bilateral brachial, anterior tibial, and posterior tibial arteries, when vessel segments are accessible. Bilateral testing is considered an integral part of a complete examination. Photoelectric Plethysmograph (PPG) waveforms and toe systolic pressure readings are included as required and additional duplex testing as needed. Limited examinations for reoccurring indications may be performed as noted.  ABI Findings: +---------+------------------+-----+----------+--------+ Right    Rt Pressure (mmHg)IndexWaveform  Comment  +---------+------------------+-----+----------+--------+ Brachial 116                                       +---------+------------------+-----+----------+--------+ ATA      105  0.91 monophasic         +---------+------------------+-----+----------+--------+ PTA      97                0.84 monophasic         +---------+------------------+-----+----------+--------+ Great Toe48                0.41 Abnormal           +---------+------------------+-----+----------+--------+ +---------+------------------+-----+--------+------------+ Left     Lt Pressure (mmHg)IndexWaveformComment      +---------+------------------+-----+--------+------------+ Brachial 109                                         +---------+------------------+-----+--------+------------+ ATA                                     Not obtained +---------+------------------+-----+--------+------------+ PTA                                     Not Obtained  +---------+------------------+-----+--------+------------+ Great Toe81                0.70 Normal               +---------+------------------+-----+--------+------------+ +-------+-----------+-----------+------------+------------+ ABI/TBIToday's ABIToday's TBIPrevious ABIPrevious TBI +-------+-----------+-----------+------------+------------+ Right  .91        .41        .94         .51          +-------+-----------+-----------+------------+------------+ Left              .70        1.05        .48          +-------+-----------+-----------+------------+------------+  Left TBIs appear increased compared to prior study on 12/24/2020. Right ABIs appear essentially unchanged compared to prior study on 12/24/2020. Rt TBIs appear decreased compared to prior study on 12/24/2020.  Summary: Right: Resting right ankle-brachial index indicates mild right lower extremity arterial disease. The right toe-brachial index is abnormal. Left: The left toe-brachial index is normal. ABIs not obtained due to bandages placed by Wound Care on 03/05/2021.  *See table(s) above for measurements and observations.  Electronically signed by Hortencia Pilar MD on 03/12/2021 at 5:10:04 PM.    Final        Assessment & Plan:   1. Atherosclerosis of native arteries of the extremities with ulceration ([place]) And long discussion with the patient regarding her wound and noninvasive studies.  The patient does not wish to have intervention done at this time.  She notes that her wound healed however it really opened about a week later.  We discussed the role of blood flow with wound healing.  However despite this patient still wishes to continue conservative therapy.  We will continue to maintain close follow-up the patient will follow up in 6 weeks.  2. Hyperlipidemia, unspecified hyperlipidemia type Continue statin as ordered and reviewed, no changes at this time   3. Benign hypertension with CKD (chronic kidney disease)  stage III (HCC) Continue antihypertensive medications as already ordered, these medications have been reviewed and there are no changes at this time.    Current Outpatient Medications on File Prior to Visit  Medication Sig Dispense Refill   acetaminophen (TYLENOL) 500  MG tablet Take 500 mg by mouth in the morning and at bedtime.     apixaban (ELIQUIS) 5 MG TABS tablet Take 1 tablet (5 mg total) by mouth 2 (two) times daily. 60 tablet 0   aspirin EC 81 MG tablet Take 1 tablet (81 mg total) by mouth daily. Swallow whole. 150 tablet 2   atorvastatin (LIPITOR) 20 MG tablet TAKE 1 TABLET BY MOUTH ONCE DAILY FOR CHOLESTEROL 30 tablet 11   bisacodyl (DULCOLAX) 5 MG EC tablet Take 2 tablets (10 mg total) by mouth daily as needed for moderate constipation. 180 tablet 1   Cholecalciferol (VITAMIN D) 2000 units tablet Take 2,000 Units by mouth daily.      dextromethorphan-guaiFENesin (MUCINEX DM) 30-600 MG 12hr tablet Take 1 tablet by mouth 2 (two) times daily.     furosemide (LASIX) 40 MG tablet TAKE (1) TABLET BY MOUTH ONCE DAILY AS NEEDED. (Patient taking differently: Take 20 mg by mouth daily.) 30 tablet 11   levocetirizine (XYZAL) 5 MG tablet TAKE (1/2) TABLET BY MOUTH EVERY OTHER DAY. 23 tablet 3   lidocaine (XYLOCAINE) 5 % ointment Apply 1 application topically daily as needed. 35.44 g 0   lisinopril (ZESTRIL) 10 MG tablet Take by mouth.     Melatonin 10 MG TABS Take 10 mg by mouth at bedtime.     oxyCODONE 10 MG TABS Take 1 tablet (10 mg total) by mouth in the morning and at bedtime. Must last 30 days. 60 tablet 0   [START ON 04/03/2021] oxyCODONE 10 MG TABS Take 1 tablet (10 mg total) by mouth in the morning and at bedtime. Must last 30 days. 60 tablet 0   [START ON 05/03/2021] oxyCODONE 10 MG TABS Take 1 tablet (10 mg total) by mouth in the morning and at bedtime. Must last 30 days. 60 tablet 0   OXYGEN Inhale 2 L into the lungs at bedtime.      rOPINIRole (REQUIP) 2 MG tablet TAKE 2 TABLETS BY  MOUTH AT BEDTIME 180 tablet 0   solifenacin (VESICARE) 5 MG tablet Take 1 tablet by mouth daily.     Tiotropium Bromide-Olodaterol (STIOLTO RESPIMAT) 2.5-2.5 MCG/ACT AERS Inhale into the lungs.     umeclidinium-vilanterol (ANORO ELLIPTA) 62.5-25 MCG/INH AEPB Inhale into the lungs.     albuterol (PROVENTIL) (2.5 MG/3ML) 0.083% nebulizer solution Take 3 mLs (2.5 mg total) by nebulization every 8 (eight) hours as needed for wheezing or shortness of breath. 75 mL 3   Wheat Dextrin (BENEFIBER) POWD Take 6 g by mouth 3 (three) times daily before meals. (2 tsp = 6 g) (Patient not taking: Reported on 01/27/2021) 730 g 8   No current facility-administered medications on file prior to visit.    There are no Patient Instructions on file for this visit. No follow-ups on file.   Kris Hartmann, NP

## 2021-03-19 ENCOUNTER — Encounter: Payer: Medicare HMO | Admitting: Physician Assistant

## 2021-03-19 ENCOUNTER — Other Ambulatory Visit: Payer: Self-pay

## 2021-03-19 DIAGNOSIS — N189 Chronic kidney disease, unspecified: Secondary | ICD-10-CM | POA: Diagnosis not present

## 2021-03-19 DIAGNOSIS — I872 Venous insufficiency (chronic) (peripheral): Secondary | ICD-10-CM | POA: Diagnosis not present

## 2021-03-19 DIAGNOSIS — I130 Hypertensive heart and chronic kidney disease with heart failure and stage 1 through stage 4 chronic kidney disease, or unspecified chronic kidney disease: Secondary | ICD-10-CM | POA: Diagnosis not present

## 2021-03-19 DIAGNOSIS — Z7901 Long term (current) use of anticoagulants: Secondary | ICD-10-CM | POA: Diagnosis not present

## 2021-03-19 DIAGNOSIS — L97322 Non-pressure chronic ulcer of left ankle with fat layer exposed: Secondary | ICD-10-CM | POA: Diagnosis not present

## 2021-03-19 DIAGNOSIS — I480 Paroxysmal atrial fibrillation: Secondary | ICD-10-CM | POA: Diagnosis not present

## 2021-03-19 DIAGNOSIS — I509 Heart failure, unspecified: Secondary | ICD-10-CM | POA: Diagnosis not present

## 2021-03-19 DIAGNOSIS — G9009 Other idiopathic peripheral autonomic neuropathy: Secondary | ICD-10-CM | POA: Diagnosis not present

## 2021-03-19 DIAGNOSIS — G6289 Other specified polyneuropathies: Secondary | ICD-10-CM | POA: Diagnosis not present

## 2021-03-19 DIAGNOSIS — L97522 Non-pressure chronic ulcer of other part of left foot with fat layer exposed: Secondary | ICD-10-CM | POA: Diagnosis not present

## 2021-03-19 NOTE — Progress Notes (Addendum)
Dredge, Konnie L. ([id number]) Visit Report for 03/19/2021 Arrival Information Details Patient Name: [name], [name] Date of Service: 03/19/2021 10:30 AM Medical Record Number: [id number] Patient Account Number: [id number] Date of Birth/Sex: [birth year] (87 y.o. F) Treating RN: Kristin [name] Primary Care Kristin [name]: Kristin [name] Other Clinician: Referring Kristin [name]: Kristin [name] Treating Kristin [name]/Extender: Kristin [name] in Treatment: 10 Visit Information History Since Last Visit Added or deleted any medications: No Patient Arrived: [name] Had a fall or experienced change in No Arrival Time: 10:58 activities of daily living that may affect Accompanied By: self risk of falls: Transfer Assistance: None Hospitalized since last visit: No Patient Requires Transmission-Based No Has Dressing in Place as Prescribed: Yes Precautions: Pain Present Now: No Patient Has Alerts: Yes Patient Alerts: Patient on Blood Thinner ELIQUIS and ASPIRIN NOT diabetic ABI left 0.81 @vascular  ABI right 0.83 @vascular  Electronic Signature(s) Signed: 03/20/2021 1:22:28 PM By: Kristin [name] Entered ByDonnamarie [name] on 03/19/2021 11:01:44 [name], [name] ([id number]) -------------------------------------------------------------------------------- Clinic Level of Care Assessment Details Patient Name: [name], [name] Date of Service: 03/19/2021 10:30 AM Medical Record Number: [id number] Patient Account Number: [id number] Date of Birth/Sex: 12-25-1929 (87 y.o. F) Treating RN: Kristin [name] Primary Care Kristin [name]: Kristin [name] Other Clinician: Referring Kristin [name]: Kristin [name] Treating Hazely Sealey/Extender: Kristin [name] in Treatment: 10 Clinic Level of Care Assessment Items TOOL 1 Quantity Score []  - Use when EandM and Procedure is performed on INITIAL visit 0 ASSESSMENTS - Nursing Assessment / Reassessment []  - General Physical Exam (combine w/ comprehensive assessment (listed just  below) when performed on new 0 pt. evals) []  - 0 Comprehensive Assessment (HX, ROS, Risk Assessments, Wounds Hx, etc.) ASSESSMENTS - Wound and Skin Assessment / Reassessment []  - Dermatologic / Skin Assessment (not related to wound area) 0 ASSESSMENTS - Ostomy and/or Continence Assessment and Care []  - Incontinence Assessment and Management 0 []  - 0 Ostomy Care Assessment and Management (repouching, etc.) PROCESS - Coordination of Care []  - Simple Patient / Family Education for ongoing care 0 []  - 0 Complex (extensive) Patient / Family Education for ongoing care []  - 0 Staff obtains Programmer, systems, Records, Test Results / Process Orders []  - 0 Staff telephones HHA, Nursing Homes / Clarify orders / etc []  - 0 Routine Transfer to another Facility (non-emergent condition) []  - 0 Routine Hospital Admission (non-emergent condition) []  - 0 New Admissions / Biomedical engineer / Ordering NPWT, Apligraf, etc. []  - 0 Emergency Hospital Admission (emergent condition) PROCESS - Special Needs []  - Pediatric / Minor Patient Management 0 []  - 0 Isolation Patient Management []  - 0 Hearing / Language / Visual special needs []  - 0 Assessment of Community assistance (transportation, D/C planning, etc.) []  - 0 Additional assistance / Altered mentation []  - 0 Support Surface(s) Assessment (bed, cushion, seat, etc.) INTERVENTIONS - Miscellaneous []  - External ear exam 0 []  - 0 Patient Transfer (multiple staff / Civil Service fast streamer / Similar devices) []  - 0 Simple Staple / Suture removal (25 or less) []  - 0 Complex Staple / Suture removal (26 or more) []  - 0 Hypo/Hyperglycemic Management (do not check if billed separately) []  - 0 Ankle / Brachial Index (ABI) - do not check if billed separately Has the patient been seen at the hospital within the last three years: Yes Total Score: 0 Level Of Care: ____ [name], [name] ([id number]) Electronic Signature(s) Signed: 03/20/2021 1:22:28 PM By: Kristin [name] Entered By: Kristin [name] on 03/19/2021 11:20:34 [name], [name] ([id number]) -------------------------------------------------------------------------------- Compression Therapy Details Patient Name: [name],  [name] Date of Service: 03/19/2021 10:30 AM Medical Record Number: [id number] Patient Account Number: [id number] Date of Birth/Sex: 10/14/1929 (87 y.o. F) Treating RN: Kristin [name] Primary Care Briceida Rasberry: Kristin [name] Other Clinician: Referring Kristin [name]: Kristin [name] Treating Kristin [name]/Extender: Kristin [name] in Treatment: 10 Compression Therapy Performed for Wound Assessment: Wound #1R Left Malleolus Performed By: Clinician Kristin Poag, RN Compression Type: Three Layer Post Procedure Diagnosis Same as Pre-procedure Electronic Signature(s) Signed: 03/20/2021 1:22:28 PM By: Kristin [name] Entered ByDonnamarie [name] on 03/19/2021 11:19:36 [name], [name] ([id number]) -------------------------------------------------------------------------------- Encounter Discharge Information Details Patient Name: [name], [name] Date of Service: 03/19/2021 10:30 AM Medical Record Number: [id number] Patient Account Number: [id number] Date of Birth/Sex: 11-13-1929 (87 y.o. F) Treating RN: Kristin [name] Primary Care Kristin [name]: Kristin [name] Other Clinician: Referring Kristin [name]: Kristin [name] Treating Kristin [name]/Extender: Kristin [name] in Treatment: 10 Encounter Discharge Information Items Discharge Condition: Stable Ambulatory Status: Cane Discharge Destination: Home Transportation: Private Auto Accompanied By: self Schedule Follow-up Appointment: Yes Clinical Summary of Care: Electronic Signature(s) Signed: 03/20/2021 1:22:28 PM By: Kristin [name] Entered ByDonnamarie [name] on 03/19/2021 11:21:18 [name], [name] ([id number]) -------------------------------------------------------------------------------- Lower Extremity Assessment Details Patient Name: [name], [name] Date of Service: 03/19/2021 10:30 AM Medical Record Number: [id number] Patient Account Number: [id number] Date of Birth/Sex: 1930/02/11 (87 y.o. F) Treating RN: Kristin [name] Primary Care Honore Wipperfurth: Kristin [name] Other Clinician: Referring Annaliese Saez: Kristin [name] Treating Helaman Mecca/Extender: Kristin [name] in Treatment: 10 Edema Assessment Assessed: [Left: Yes] Patrice Paradise: No] [Left: Edema] [Right: :] Calf Left: Right: Point of Measurement: 33 cm From Medial Instep 31 cm Ankle Left: Right: Point of Measurement: 9 cm From Medial Instep 22 cm Vascular Assessment Pulses: Dorsalis Pedis Palpable: [Left:Yes] Electronic Signature(s) Signed: 03/20/2021 1:22:28 PM By: Kristin [name] Entered ByDonnamarie [name] on 03/19/2021 11:10:06 Pariseau, Florrie L. ([id number]) -------------------------------------------------------------------------------- Multi Wound Chart Details Patient Name: [name], [name] Date of Service: 03/19/2021 10:30 AM Medical Record Number: [id number] Patient Account Number: [id number] Date of Birth/Sex: Nov 15, 1929 (87 y.o. F) Treating RN: Kristin [name] Primary Care Abdiaziz Klahn: Kristin [name] Other Clinician: Referring Landy Dunnavant: Kristin [name] Treating Demarion Pondexter/Extender: Kristin [name] in Treatment: 10 Vital Signs Height(in): 45 Pulse(bpm): 84 Weight(lbs): 147 Blood Pressure(mmHg): 122/73 Body Mass Index(BMI): 23 Temperature(F): 98.1 Respiratory Rate(breaths/min): 16 Photos: [N/A:N/A] Wound Location: Left Malleolus N/A N/A Wounding Event: Gradually Appeared N/A N/A Primary Etiology: Neuropathic Ulcer-Non Diabetic N/A N/A Comorbid History: Cataracts, Chronic Obstructive N/A N/A Pulmonary Disease (COPD), Arrhythmia, Congestive Heart Failure, Coronary Artery Disease, Hypertension, Hepatitis C, History of pressure wounds, Gout, Osteoarthritis, Received Chemotherapy, Received Radiation Date Acquired: 09/23/2020 N/A N/A Weeks of Treatment: 10  N/A N/A Wound Status: Open N/A N/A Wound Recurrence: Yes N/A N/A Measurements L x W x D (cm) 0.5x0.2x0.1 N/A N/A Area (cm) : 0.079 N/A N/A Volume (cm) : 0.008 N/A N/A % Reduction in Area: 83.20% N/A N/A % Reduction in Volume: 83.00% N/A N/A Classification: Full Thickness Without Exposed N/A N/A Support Structures Exudate Amount: Medium N/A N/A Exudate Type: Serosanguineous N/A N/A Exudate Color: red, brown N/A N/A Granulation Amount: Large (67-100%) N/A N/A Granulation Quality: Red, Pink N/A N/A Necrotic Amount: None Present (0%) N/A N/A Exposed Structures: Fat Layer (Subcutaneous Tissue): N/A N/A Yes Fascia: No Tendon: No Muscle: No Joint: No Bone: No Epithelialization: None N/A N/A Treatment Notes Electronic Signature(s) [name], [name] ([id number]) Signed: 03/20/2021 1:22:28 PM By: Kristin [name] Entered ByDonnamarie [name] on 03/19/2021 11:10:47 [name], [name] ([id number]) -------------------------------------------------------------------------------- [place] Details Patient Name: [name], [name] Date of  Service: 03/19/2021 10:30 AM Medical Record Number: [id number] Patient Account Number: [id number] Date of Birth/Sex: [birth year] (87 y.o. F) Treating RN: Kristin [name] Primary Care Malakai Schoenherr: Kristin [name] Other Clinician: Referring Raine Blodgett: Kristin [name] Treating Larrie Fraizer/Extender: Kristin [name] in Treatment: 10 Active Inactive Electronic Signature(s) Signed: 03/20/2021 1:22:28 PM By: Kristin [name] Entered ByDonnamarie [name] on 03/19/2021 11:10:22 Borman, Yasmine L. ([id number]) -------------------------------------------------------------------------------- Pain Assessment Details Patient Name: Cumby, Miel L. Date of Service: 03/19/2021 10:30 AM Medical Record Number: [id number] Patient Account Number: [id number] Date of Birth/Sex: [birth year] (87 y.o. F) Treating RN: Kristin [name] Primary Care Phill Steck: Kristin [name] Other  Clinician: Referring Abigayl Hor: Kristin [name] Treating Eron Goble/Extender: Kristin [name] in Treatment: 10 Active Problems Location of Pain Severity and Description of Pain Patient Has Paino Yes Site Locations Pain Location: Generalized Pain, Pain in Ulcers Rate the pain. Current Pain Level: 3 Pain Management and Medication Current Pain Management: Electronic Signature(s) Signed: 03/20/2021 1:22:28 PM By: Kristin [name] Entered By: Kristin [name] on 03/19/2021 11:07:36 Schnepp, Enzley L. ([id number]) -------------------------------------------------------------------------------- Patient/Caregiver Education Details Patient Name: [name], [name] Date of Service: 03/19/2021 10:30 AM Medical Record Number: [id number] Patient Account Number: [id number] Date of Birth/Gender: 11-03-1929 (87 y.o. F) Treating RN: Kristin [name] Primary Care Physician: Kristin [name] Other Clinician: Referring Physician: Nobie [name] Treating Physician/Extender: Kristin [name] in Treatment: 10 Education Assessment Education Provided To: Patient Education Topics Provided Basic Hygiene: Offloading: Wound/Skin Impairment: Electronic Signature(s) Signed: 03/20/2021 1:22:28 PM By: Kristin [name] Entered ByDonnamarie [name] on 03/19/2021 11:14:25 [name], [name] ([id number]) -------------------------------------------------------------------------------- Wound Assessment Details Patient Name: Accomando, [name] Date of Service: 03/19/2021 10:30 AM Medical Record Number: [id number] Patient Account Number: [id number] Date of Birth/Sex: 12-May-1930 (87 y.o. F) Treating RN: Kristin [name] Primary Care Fatime Biswell: Kristin [name] Other Clinician: Referring Orlyn Odonoghue: Kristin [name] Treating Berenize Gatlin/Extender: Kristin [name] in Treatment: 10 Wound Status Wound Number: 1R Primary Neuropathic Ulcer-Non Diabetic Etiology: Wound Location: Left Malleolus Wound Open Wounding Event: Gradually  Appeared Status: Date Acquired: 09/23/2020 Comorbid Cataracts, Chronic Obstructive Pulmonary Disease (COPD), Weeks Of Treatment: 10 History: Arrhythmia, Congestive Heart Failure, Coronary Artery Clustered Wound: No Disease, Hypertension, Hepatitis C, History of pressure wounds, Gout, Osteoarthritis, Received Chemotherapy, Received Radiation Photos Wound Measurements Length: (cm) 0.5 Width: (cm) 0.2 Depth: (cm) 0.1 Area: (cm) 0.079 Volume: (cm) 0.008 % Reduction in Area: 83.2% % Reduction in Volume: 83% Epithelialization: None Wound Description Classification: Full Thickness Without Exposed Support Structu Exudate Amount: Medium Exudate Type: Serosanguineous Exudate Color: red, brown res Foul Odor After Cleansing: No Slough/Fibrino No Wound Bed Granulation Amount: Large (67-100%) Exposed Structure Granulation Quality: Red, Pink Fascia Exposed: No Necrotic Amount: None Present (0%) Fat Layer (Subcutaneous Tissue) Exposed: Yes Tendon Exposed: No Muscle Exposed: No Joint Exposed: No Bone Exposed: No Treatment Notes Wound #1R (Malleolus) Wound Laterality: Left Cleanser Soap and Water Discharge Instruction: Gently cleanse wound with antibacterial soap, rinse and pat dry prior to dressing wounds Laguna, Bailie L. ([id number]) Peri-Wound Care Topical Primary Dressing Hydrofera Blue Ready Transfer Foam, 2.5x2.5 (in/in) Discharge Instruction: Apply Hydrofera Blue Ready to wound bed as directed Secondary Dressing ABD Pad 5x9 (in/in) Discharge Instruction: Cover with ABD pad Secured With Compression Wrap Profore Lite LF 3 Multilayer Compression [place] Discharge Instruction: Apply 3 multi-layer wrap as prescribed. Compression Stockings Circaid Juxta Lite Compression Wrap Quantity: 1 Left Leg Compression Amount: 30-40 mmHg Right Leg Compression Amount: 30-40 mmHg Discharge Instruction: Apply Circaid Juxta Lite Compression Wrap as directed Add-Ons Electronic  Signature(s) Signed: 03/20/2021 1:22:28 PM By: Kristin [name] Entered  ByDonnamarie [name] on 03/19/2021 11:08:21 Rodick, Shekinah L. ([id number]) -------------------------------------------------------------------------------- Vitals Details Patient Name: Korson, Shanaye L. Date of Service: 03/19/2021 10:30 AM Medical Record Number: [id number] Patient Account Number: [id number] Date of Birth/Sex: 1930-07-07 (87 y.o. F) Treating RN: Kristin [name] Primary Care Massiah Minjares: Kristin [name] Other Clinician: Referring Norval Slaven: Kristin [name] Treating Cleora Karnik/Extender: Kristin [name] in Treatment: 10 Vital Signs Time Taken: 11:01 Temperature (F): 98.1 Height (in): 67 Pulse (bpm): 76 Weight (lbs): 147 Respiratory Rate (breaths/min): 16 Body Mass Index (BMI): 23 Blood Pressure (mmHg): 122/73 Reference Range: 80 - 120 mg / dl Electronic Signature(s) Signed: 03/20/2021 1:22:28 PM By: Kristin [name] Entered ByDonnamarie [name] on 03/19/2021 11:03:16

## 2021-03-19 NOTE — Progress Notes (Addendum)
Navratil, Hopie L. ([id number]) Visit Report for 03/19/2021 Chief Complaint Document Details Patient Name: Kristin [name], [name] Date of Service: 03/19/2021 10:30 AM Medical Record Number: [id number] Patient Account Number: [id number] Date of Birth/Sex: [birth year] (87 y.o. F) Treating RN: Donnamarie Poag Primary Care Provider: [name] Other Clinician: Referring Provider: [name] Treating Provider/Extender: [name] in Treatment: 10 Information Obtained from: Patient Chief Complaint Left ankle and left foot ulcers Electronic Signature(s) Signed: 03/19/2021 10:54:22 AM By: Worthy Keeler PA-C Entered By: Worthy Keeler on 03/19/2021 10:54:22 Kristin [name], [name] ([id number]) -------------------------------------------------------------------------------- HPI Details Patient Name: [name], [name] Date of Service: 03/19/2021 10:30 AM Medical Record Number: [id number] Patient Account Number: [id number] Date of Birth/Sex: 1929/10/08 (87 y.o. F) Treating RN: Donnamarie Poag Primary Care Provider: [name] Other Clinician: Referring Provider: [name] Treating Provider/Extender: [name] in Treatment: 10 History of Present Illness HPI Description: Left 0.81 Right 0.83 12/25/20 About a year but patient is not sure how long for sure 01/06/2021 upon evaluation today patient presents for initial inspection here in our clinic concerning issues she has been having in 2 areas one being on her left lateral malleolus. This is the most significant of the 2 regions based on what I am seeing today. The other is actually over her left dorsal 3rd toe. With that being said the patient tells me that currently she has been tolerating the dressing changes without complication. There does not appear to be any signs of obvious infection at this time but nonetheless she does not seem to be doing a lot better. She is mainly just been putting a cream on the areas but  she is not sure what cream this was when I questioned her about that today she states "it may be on my med list" although I did not see that. The patient is really not a very good historian in general concerning how long this has been going on and what exactly is going on. The patient does have a history of neuropathy, chronic kidney disease, atrial fibrillation and is on long-term anticoagulant therapy as well as aspirin. This includes Eliquis. 5/24; patient has an open area on the left lateral malleolus as well as an area on the base of her third toe which is mycotic also on the left. We have been using Hydrofera Blue as of last week. Biopsy that was done showed chronic stasis dermatitis with areas of healing erosion and overlying inflammatory crust. Negative for malignancy and negative for fungal organisms. X-ray of the foot did not show anything abnormal except degenerative changes 6/1; left lateral malleolus wound had a lot of callus and dry skin on the surface. I remove this there is only 2 small open areas. The base of the left third toe at the base of the mycotic nail also required debridement we have been using silver alginate in both areas 01/27/2021 upon evaluation today patient appears to be doing decently well in regard to her wounds. In fact she appears to be healed based on what I am seeing at this point. I do not see any signs of active infection at this time. Readmission: 02/26/2021 upon evaluation today patient unfortunately has a reopening of her wound on her leg. She has on the left lateral ankle noted wound has been open for about a week. She tells me that she has not been wearing compression since we discharged her labs under the impression that she had compression stockings and would be wearing those. Nonetheless it  does not appear to be the case. I do believe this may be very likely why this reopen. Fortunately there does not appear to be any signs of active infection at this  time. No fevers, chills, nausea, vomiting, or diarrhea. 03/05/2021 upon evaluation today patient appears to be doing well with regard to the wound on her ankle region. She has been tolerating the dressing changes without complication. Fortunately there is no evidence of active infection which is great news. 03/13/2021 upon evaluation today patient appears to be doing well with regard to her wound. She has been tolerating the dressing changes without complication and very pleased in that regard. There does not appear to be any signs of active infection which is great news and overall I think the patient is making excellent progress. 03/19/2021 upon evaluation today patient appears to be doing well with regard to her ankle ulcer. She has been tolerating the dressing changes without complication and overall I think she is actually making excellent progress. This is measuring significantly smaller today as compared to last time I saw her. Electronic Signature(s) Signed: 03/19/2021 3:43:08 PM By: Worthy Keeler PA-C Entered By: Worthy Keeler on 03/19/2021 15:43:08 Kristin [name], [name] ([id number]) -------------------------------------------------------------------------------- Physical Exam Details Patient Name: [name], [name] Date of Service: 03/19/2021 10:30 AM Medical Record Number: [id number] Patient Account Number: [id number] Date of Birth/Sex: July 22, 1930 (87 y.o. F) Treating RN: Donnamarie Poag Primary Care Provider: [name] Other Clinician: Referring Provider: [name] Treating Provider/Extender: [name] in Treatment: 59 Constitutional Well-nourished and well-hydrated in no acute distress. Respiratory normal breathing without difficulty. Psychiatric this patient is able to make decisions and demonstrates good insight into disease process. Alert and Oriented x 3. pleasant and cooperative. Notes Patient's wound bed showed signs of good granulation  epithelization at this point. I do not see any evidence of active infection which is great news and overall I am extremely pleased with where things stand currently. No fevers, chills, nausea, vomiting, or diarrhea. Electronic Signature(s) Signed: 03/19/2021 3:43:24 PM By: Worthy Keeler PA-C Entered By: Worthy Keeler on 03/19/2021 15:43:24 Kristin [name], [name] ([id number]) -------------------------------------------------------------------------------- Physician Orders Details Patient Name: Grahn, Tajae L. Date of Service: 03/19/2021 10:30 AM Medical Record Number: [id number] Patient Account Number: [id number] Date of Birth/Sex: 19-Jan-1930 (87 y.o. F) Treating RN: Donnamarie Poag Primary Care Provider: [name] Other Clinician: Referring Provider: [name] Treating Provider/Extender: [name] in Treatment: 10 Verbal / Phone Orders: No Diagnosis Coding ICD-10 Coding Code Description G90.09 Other idiopathic peripheral autonomic neuropathy L97.322 Non-pressure chronic ulcer of left ankle with fat layer exposed L97.522 Non-pressure chronic ulcer of other part of left foot with fat layer exposed N18.9 Chronic kidney disease, unspecified I48.0 Paroxysmal atrial fibrillation Z79.01 Long term (current) use of anticoagulants Follow-up Appointments o Return Appointment in 1 week. - Bring your velcro compression wrap (Juxtelite) with you to each appt for when you need to start using it o Nurse Visit as needed Bathing/ Shower/ Hygiene o May shower with wound dressing protected with water repellent cover or cast protector. o No tub bath. Edema Control - Lymphedema / Segmental Compressive Device / Other Left Lower Extremity o Optional: One layer of unna paste to top of compression wrap (to act as an anchor). o Elevate, Exercise Daily and Avoid Standing for Long Periods of Time. o Elevate legs to the level of the heart and pump ankles as often as  possible o Elevate leg(s) parallel to the floor when sitting. o DO  YOUR BEST to sleep in the bed at night. DO NOT sleep in your recliner. Long hours of sitting in a recliner leads to swelling of the legs and/or potential wounds on your backside. Medications-Please add to medication list. o Take one 500mg  Tylenol (Acetaminophen) and one 200mg  Motrin (Ibuprofen) every 6 hours for pain. Do not take ibuprofen if you are on blood thinners or have stomach ulcers. - If needed for pain and not contraindicated with other medication with primary doctor Wound Treatment Wound #1R - Malleolus Wound Laterality: Left Cleanser: Soap and Water 1 x Per Week/30 Days Discharge Instructions: Gently cleanse wound with antibacterial soap, rinse and pat dry prior to dressing wounds Primary Dressing: Hydrofera Blue Ready Transfer Foam, 2.5x2.5 (in/in) 1 x Per Week/30 Days Discharge Instructions: Apply Hydrofera Blue Ready to wound bed as directed Secondary Dressing: ABD Pad 5x9 (in/in) 1 x Per Week/30 Days Discharge Instructions: Cover with ABD pad Compression Wrap: Profore Lite LF 3 Multilayer Compression Bandaging System (Generic) 1 x Per Week/30 Days Discharge Instructions: Apply 3 multi-layer wrap as prescribed. Compression Stockings: Circaid Juxta Lite Compression Wrap Left Leg Compression Amount: 30-40 mmHG Right Leg Compression Amount: 30-40 mmHG Discharge Instructions: Apply Circaid Juxta Lite Compression Wrap as directed Kristin [name], [name] ([id number]) Electronic Signature(s) Signed: 03/19/2021 4:44:10 PM By: Worthy Keeler PA-C Signed: 03/20/2021 1:22:28 PM By: Donnamarie Poag Entered By: Donnamarie Poag on 03/19/2021 11:20:22 Kristin [name], [name] ([id number]) -------------------------------------------------------------------------------- Problem List Details Patient Name: Kristin [name], [name] Date of Service: 03/19/2021 10:30 AM Medical Record Number: [id number] Patient Account Number: [id number] Date of Birth/Sex:  May 17, 1930 (87 y.o. F) Treating RN: Donnamarie Poag Primary Care Provider: [name] Other Clinician: Referring Provider: [name] Treating Provider/Extender: [name] in Treatment: 10 Active Problems ICD-10 Encounter Code Description Active Date MDM Diagnosis G90.09 Other idiopathic peripheral autonomic neuropathy 01/06/2021 No Yes L97.322 Non-pressure chronic ulcer of left ankle with fat layer exposed 01/06/2021 No Yes L97.522 Non-pressure chronic ulcer of other part of left foot with fat layer 01/06/2021 No Yes exposed N18.9 Chronic kidney disease, unspecified 01/06/2021 No Yes I48.0 Paroxysmal atrial fibrillation 01/06/2021 No Yes Z79.01 Long term (current) use of anticoagulants 01/06/2021 No Yes Inactive Problems Resolved Problems Electronic Signature(s) Signed: 03/19/2021 10:54:16 AM By: Worthy Keeler PA-C Entered By: Worthy Keeler on 03/19/2021 10:54:16 Kristin [name], [name] ([id number]) -------------------------------------------------------------------------------- Progress Note Details Patient Name: Kristin [name], [name] Date of Service: 03/19/2021 10:30 AM Medical Record Number: [id number] Patient Account Number: [id number] Date of Birth/Sex: [birth year] (87 y.o. F) Treating RN: Donnamarie Poag Primary Care Provider: [name] Other Clinician: Referring Provider: [name] Treating Provider/Extender: [name] in Treatment: 10 Subjective Chief Complaint Information obtained from Patient Left ankle and left foot ulcers History of Present Illness (HPI) Left 0.81 Right 0.83 12/25/20 About a year but patient is not sure how long for sure 01/06/2021 upon evaluation today patient presents for initial inspection here in our clinic concerning issues she has been having in 2 areas one being on her left lateral malleolus. This is the most significant of the 2 regions based on what I am seeing today. The other is actually over her  left dorsal 3rd toe. With that being said the patient tells me that currently she has been tolerating the dressing changes without complication. There does not appear to be any signs of obvious infection at this time but nonetheless she does not seem to be doing a lot better. She is mainly just been putting a cream on the  areas but she is not sure what cream this was when I questioned her about that today she states "it may be on my med list" although I did not see that. The patient is really not a very good historian in general concerning how long this has been going on and what exactly is going on. The patient does have a history of neuropathy, chronic kidney disease, atrial fibrillation and is on long-term anticoagulant therapy as well as aspirin. This includes Eliquis. 5/24; patient has an open area on the left lateral malleolus as well as an area on the base of her third toe which is mycotic also on the left. We have been using Hydrofera Blue as of last week. Biopsy that was done showed chronic stasis dermatitis with areas of healing erosion and overlying inflammatory crust. Negative for malignancy and negative for fungal organisms. X-ray of the foot did not show anything abnormal except degenerative changes 6/1; left lateral malleolus wound had a lot of callus and dry skin on the surface. I remove this there is only 2 small open areas. The base of the left third toe at the base of the mycotic nail also required debridement we have been using silver alginate in both areas 01/27/2021 upon evaluation today patient appears to be doing decently well in regard to her wounds. In fact she appears to be healed based on what I am seeing at this point. I do not see any signs of active infection at this time. Readmission: 02/26/2021 upon evaluation today patient unfortunately has a reopening of her wound on her leg. She has on the left lateral ankle noted wound has been open for about a week. She tells me  that she has not been wearing compression since we discharged her labs under the impression that she had compression stockings and would be wearing those. Nonetheless it does not appear to be the case. I do believe this may be very likely why this reopen. Fortunately there does not appear to be any signs of active infection at this time. No fevers, chills, nausea, vomiting, or diarrhea. 03/05/2021 upon evaluation today patient appears to be doing well with regard to the wound on her ankle region. She has been tolerating the dressing changes without complication. Fortunately there is no evidence of active infection which is great news. 03/13/2021 upon evaluation today patient appears to be doing well with regard to her wound. She has been tolerating the dressing changes without complication and very pleased in that regard. There does not appear to be any signs of active infection which is great news and overall I think the patient is making excellent progress. 03/19/2021 upon evaluation today patient appears to be doing well with regard to her ankle ulcer. She has been tolerating the dressing changes without complication and overall I think she is actually making excellent progress. This is measuring significantly smaller today as compared to last time I saw her. Objective Constitutional Well-nourished and well-hydrated in no acute distress. Kristin [name], [name] ([id number]) Vitals Time Taken: 11:01 AM, Height: 67 in, Weight: 147 lbs, BMI: 23, Temperature: 98.1 F, Pulse: 76 bpm, Respiratory Rate: 16 breaths/min, Blood Pressure: 122/73 mmHg. Respiratory normal breathing without difficulty. Psychiatric this patient is able to make decisions and demonstrates good insight into disease process. Alert and Oriented x 3. pleasant and cooperative. General Notes: Patient's wound bed showed signs of good granulation epithelization at this point. I do not see any evidence of active infection which is great news  and  overall I am extremely pleased with where things stand currently. No fevers, chills, nausea, vomiting, or diarrhea. Integumentary (Hair, Skin) Wound #1R status is Open. Original cause of wound was Gradually Appeared. The date acquired was: 09/23/2020. The wound has been in treatment 10 weeks. The wound is located on the Left Malleolus. The wound measures 0.5cm length x 0.2cm width x 0.1cm depth; 0.079cm^2 area and 0.008cm^3 volume. There is Fat Layer (Subcutaneous Tissue) exposed. There is a medium amount of serosanguineous drainage noted. There is large (67-100%) red, pink granulation within the wound bed. There is no necrotic tissue within the wound bed. 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 There was a Three Layer Compression Therapy Procedure by Donnamarie Poag, RN. Post procedure Diagnosis Wound #1R: Same as Pre-Procedure Plan Follow-up Appointments: Return Appointment in 1 week. - Bring your velcro compression wrap (Juxtelite) with you to each appt for when you need to start using it Nurse Visit as needed Bathing/ Shower/ Hygiene: May shower with wound dressing protected with water repellent cover or cast protector. No tub bath. Edema Control - Lymphedema / Segmental Compressive Device / Other: Optional: One layer of unna paste to top of compression wrap (to act as an anchor). Elevate, Exercise Daily and Avoid Standing for Long Periods of Time. Elevate legs to the level of the heart and pump ankles as often as possible Elevate leg(s) parallel to the floor when  sitting. DO YOUR BEST to sleep in the bed at night. DO NOT sleep in your recliner. Long hours of sitting in a recliner leads to swelling of the legs and/or potential wounds on your backside. Medications-Please add to medication list.: Take one 500mg  Tylenol (Acetaminophen) and one 200mg  Motrin (Ibuprofen) every 6 hours for pain. Do not take ibuprofen if you are on blood thinners or have stomach ulcers. - If needed for pain and not contraindicated with other medication with primary doctor WOUND #1R: - Malleolus Wound Laterality: Left Cleanser: Soap and Water 1 x Per Week/30 Days Discharge Instructions: Gently cleanse wound with antibacterial soap, rinse and pat dry prior to dressing wounds Primary Dressing: Hydrofera Blue Ready Transfer Foam, 2.5x2.5 (in/in) 1 x Per Week/30 Days Discharge Instructions: Apply Hydrofera Blue Ready to wound bed as directed Kmetz, Wandalene L. ([id number]) Secondary Dressing: ABD Pad 5x9 (in/in) 1 x Per Week/30 Days Discharge Instructions: Cover with ABD pad Compression Wrap: Profore Lite LF 3 Multilayer Compression Bandaging System (Generic) 1 x Per Week/30 Days Discharge Instructions: Apply 3 multi-layer wrap as prescribed. Compression Stockings: Circaid Juxta Lite Compression Wrap Compression Amount: 30-40 mmHg (left) Compression Amount: 30-40 mmHg (right) Discharge Instructions: Apply Circaid Juxta Lite Compression Wrap as directed 1. Would recommend currently that we going continue with the wound care measures as before and the patient is in agreement with the plan. This includes the use of the [facility] which I think is doing a great job. 2. I am also can recommend ABD pads to cover followed by 3 layer compression wrap. 3. I am also can recommend that the patient continue to elevate her legs much as possible. We will see patient back for reevaluation in 1 week here in the clinic.  If anything worsens or changes patient will contact our office for  additional recommendations. Electronic Signature(s) Signed: 03/19/2021 3:44:01 PM By: Worthy Keeler PA-C Entered By: Worthy Keeler on 03/19/2021 15:44:00 Kristin [name], [name] ([id number]) -------------------------------------------------------------------------------- SuperBill Details Patient Name: Kristin [name], Edessa L. Date of Service: 03/19/2021 Medical Record Number: [id number] Patient Account Number: [id number] Date of Birth/Sex: Jul 23, 1930 (87 y.o. F) Treating RN: Donnamarie Poag Primary Care Provider: [name] Other Clinician: Referring Provider: [name] Treating Provider/Extender: [name] in Treatment: 10 Diagnosis Coding ICD-10 Codes Code Description G90.09 Other idiopathic peripheral autonomic neuropathy L97.322 Non-pressure chronic ulcer of left ankle with fat layer exposed L97.522 Non-pressure chronic ulcer of other part of left foot with fat layer exposed N18.9 Chronic kidney disease, unspecified I48.0 Paroxysmal atrial fibrillation Z79.01 Long term (current) use of anticoagulants Facility Procedures CPT4 Code: [id number] Description: (Facility Use Only) [phone] - [place] LWR LT LEG Modifier: Quantity: 1 Physician Procedures CPT4 Code: [id number] Description: 91694 - WC PHYS LEVEL 4 - EST PT Modifier: Quantity: 1 CPT4 Code: Description: ICD-10 Diagnosis Description G90.09 Other idiopathic peripheral autonomic neuropathy L97.322 Non-pressure chronic ulcer of left ankle with fat layer exposed L97.522 Non-pressure chronic ulcer of other part of left foot with fat layer ex  N18.9 Chronic kidney disease, unspecified Modifier: posed Quantity: Electronic Signature(s) Signed: 03/19/2021 3:44:57 PM By: Worthy Keeler PA-C Entered By: Worthy Keeler on 03/19/2021 15:44:56

## 2021-03-20 ENCOUNTER — Encounter: Payer: Self-pay | Admitting: Pain Medicine

## 2021-03-23 ENCOUNTER — Other Ambulatory Visit: Payer: Self-pay

## 2021-03-23 NOTE — Patient Outreach (Signed)
[place] [facility]) Care Management  03/23/2021  Kristin [name] January 22, 1930 [id number]   Telephone Assessment   Unsuccessful outreach attempt to patient. No answer at present.     Plan: RN CM will make outreach attempt to patient within a month if no return call.    Enzo Montgomery, RN,BSN,CCM [facility] Telephonic Care Management Coordinator Direct Phone: [phone] Toll Free: [phone] Fax: [phone]

## 2021-03-23 NOTE — Progress Notes (Signed)
PROVIDER NOTE: Information contained herein reflects review and annotations entered in association with encounter. Interpretation of such information and data should be left to medically-trained personnel. Information provided to patient can be located elsewhere in the medical record under "Patient Instructions". Document created using STT-dictation technology, any transcriptional errors that may result from process are unintentional.    Patient: Kristin [name]  Service Category: Procedure  Provider: Gaspar Cola, MD  DOB: Mar 19, 1930  DOS: 03/24/2021  Location: [facility]  MRN: [id number]  Setting: Ambulatory - outpatient  Referring Provider: [name] *  Type: Established Patient  Specialty: Interventional Pain Management  PCP: Olin Hauser, DO   Primary Reason for Visit: Interventional Pain Management Treatment. CC: Back Pain  Procedure:          Anesthesia, Analgesia, Anxiolysis:  Type: Lumbar Facet, Medial Branch Block(s)          Primary Purpose: Diagnostic Region: Posterolateral Lumbosacral Spine Level: L2, L3, L4, L5, & S1 Medial Branch Level(s). Injecting these levels blocks the L3-4, L4-5, and L5-S1 lumbar facet joints. Laterality: Bilateral  Type: Local Anesthesia Indication(s): Analgesia         Route: Infiltration ([place]/IM) IV Access: Declined Sedation: Declined  Local Anesthetic: Lidocaine 1-2%  Position: Prone   Indications: 1. Lumbar facet syndrome (Bilateral) (L>R)   2. Lumbar facet hypertrophy (L1-2, L2-3, and L4-5)   3. DDD (degenerative disc disease), lumbosacral   4. Chronic low back pain (Bilateral) w/o sciatica   5. Spondylosis without myelopathy or radiculopathy, lumbosacral region   6. Grade 1 Retrolisthesis of L1 over L2    Pain Score: Pre-procedure: 5 /10 Post-procedure: 0-No pain/10   Pre-op H&P Assessment:  [name] is a 87 y.o. (year old), female patient, seen today for interventional treatment. She  has a  past surgical history that includes Abdominal hysterectomy; Back surgery; Rotator cuff repair; Breast surgery; Cataract extraction w/PHACO (10/19/2011); Cataract extraction w/PHACO (11/02/2011); Carpal tunnel release; Joint replacement; Colon surgery; Pacemaker insertion (N/A, 12/24/2015); Cholecystectomy; Breast lumpectomy (Right, 2004); Insert / replace / remove pacemaker; and Lower Extremity Angiography (Left, 10/14/2020). [name] has a current medication list which includes the following prescription(s): acetaminophen, apixaban, aspirin ec, atorvastatin, bisacodyl, vitamin d, dextromethorphan-guaifenesin, furosemide, levocetirizine, lidocaine, lisinopril, melatonin, oxycodone hcl, [START ON 04/03/2021] oxycodone hcl, [START ON 05/03/2021] oxycodone hcl, oxygen-helium, ropinirole, solifenacin, stiolto respimat, anoro ellipta, albuterol, and benefiber. Her primarily concern today is the Back Pain  Initial Vital Signs:  Pulse/HCG Rate: 90ECG Heart Rate: 60 Temp: (!) 96.9 F (36.1 C) Resp: 16 BP: 128/85 SpO2: (!) 75 %  BMI: Estimated body mass index is 23.49 kg/m as calculated from the following:   Height as of this encounter: 5\' 7"  (1.702 m).   Weight as of this encounter: 150 lb (68 kg).  Risk Assessment: Allergies: Reviewed. She is allergic to flexeril [cyclobenzaprine hcl] and vancomycin.  Allergy Precautions: None required Coagulopathies: Reviewed. None identified.  Blood-thinner therapy: None at this time Active Infection(s): Reviewed. None identified. [name] is afebrile  Site Confirmation: [name] was asked to confirm the procedure and laterality before marking the site Procedure checklist: Completed Consent: Before the procedure and under the influence of no sedative(s), amnesic(s), or anxiolytics, the patient was informed of the treatment options, risks and possible complications. To fulfill our ethical and legal obligations, as recommended by the American Medical Association's  Code of Ethics, I have informed the patient of my clinical impression; the nature and purpose of the treatment or procedure;  the risks, benefits, and possible complications of the intervention; the alternatives, including doing nothing; the risk(s) and benefit(s) of the alternative treatment(s) or procedure(s); and the risk(s) and benefit(s) of doing nothing. The patient was provided information about the general risks and possible complications associated with the procedure. These may include, but are not limited to: failure to achieve desired goals, infection, bleeding, organ or nerve damage, allergic reactions, paralysis, and death. In addition, the patient was informed of those risks and complications associated to Spine-related procedures, such as failure to decrease pain; infection (i.e.: Meningitis, epidural or intraspinal abscess); bleeding (i.e.: epidural hematoma, subarachnoid hemorrhage, or any other type of intraspinal or peri-dural bleeding); organ or nerve damage (i.e.: Any type of peripheral nerve, nerve root, or spinal cord injury) with subsequent damage to sensory, motor, and/or autonomic systems, resulting in permanent pain, numbness, and/or weakness of one or several areas of the body; allergic reactions; (i.e.: anaphylactic reaction); and/or death. Furthermore, the patient was informed of those risks and complications associated with the medications. These include, but are not limited to: allergic reactions (i.e.: anaphylactic or anaphylactoid reaction(s)); adrenal axis suppression; blood sugar elevation that in diabetics may result in ketoacidosis or comma; water retention that in patients with history of congestive heart failure may result in shortness of breath, pulmonary edema, and decompensation with resultant heart failure; weight gain; swelling or edema; medication-induced neural toxicity; particulate matter embolism and blood vessel occlusion with resultant organ, and/or nervous system  infarction; and/or aseptic necrosis of one or more joints. Finally, the patient was informed that Medicine is not an exact science; therefore, there is also the possibility of unforeseen or unpredictable risks and/or possible complications that may result in a catastrophic outcome. The patient indicated having understood very clearly. We have given the patient no guarantees and we have made no promises. Enough time was given to the patient to ask questions, all of which were answered to the patient's satisfaction. [name] has indicated that she wanted to continue with the procedure. Attestation: I, the ordering provider, attest that I have discussed with the patient the benefits, risks, side-effects, alternatives, likelihood of achieving goals, and potential problems during recovery for the procedure that I have provided informed consent. Date  Time: 03/24/2021 10:13 AM  Pre-Procedure Preparation:  Monitoring: As per clinic protocol. Respiration, ETCO2, SpO2, BP, heart rate and rhythm monitor placed and checked for adequate function Safety Precautions: Patient was assessed for positional comfort and pressure points before starting the procedure. Time-out: I initiated and conducted the "Time-out" before starting the procedure, as per protocol. The patient was asked to participate by confirming the accuracy of the "Time Out" information. Verification of the correct person, site, and procedure were performed and confirmed by me, the nursing staff, and the patient. "Time-out" conducted as per Joint Commission's Universal Protocol (UP.01.01.01). Time: 1044  Description of Procedure:          Laterality: Bilateral. The procedure was performed in identical fashion on both sides. Levels:  L2, L3, L4, L5, & S1 Medial Branch Level(s) Area Prepped: Posterior Lumbosacral Region DuraPrep (Iodine Povacrylex [0.7% available iodine] and Isopropyl Alcohol, 74% w/w) Safety Precautions: Aspiration looking for blood  return was conducted prior to all injections. At no point did we inject any substances, as a needle was being advanced. Before injecting, the patient was told to immediately notify me if she was experiencing any new onset of "ringing in the ears, or metallic taste in the mouth". No attempts were made at seeking  any paresthesias. Safe injection practices and needle disposal techniques used. Medications properly checked for expiration dates. SDV (single dose vial) medications used. After the completion of the procedure, all disposable equipment used was discarded in the proper designated medical waste containers. Local Anesthesia: Protocol guidelines were followed. The patient was positioned over the fluoroscopy table. The area was prepped in the usual manner. The time-out was completed. The target area was identified using fluoroscopy. A 12-in long, straight, sterile hemostat was used with fluoroscopic guidance to locate the targets for each level blocked. Once located, the skin was marked with an approved surgical skin marker. Once all sites were marked, the skin (epidermis, dermis, and hypodermis), as well as deeper tissues (fat, connective tissue and muscle) were infiltrated with a small amount of a short-acting local anesthetic, loaded on a 10cc syringe with a 25G, 1.5-in  Needle. An appropriate amount of time was allowed for local anesthetics to take effect before proceeding to the next step. Local Anesthetic: Lidocaine 2.0% The unused portion of the local anesthetic was discarded in the proper designated containers. Technical explanation of process:  L2 Medial Branch Nerve Block (MBB): The target area for the L2 medial branch is at the junction of the postero-lateral aspect of the superior articular process and the superior, posterior, and medial edge of the transverse process of L3. Under fluoroscopic guidance, a Quincke needle was inserted until contact was made with os over the superior postero-lateral  aspect of the pedicular shadow (target area). After negative aspiration for blood, 0.5 mL of the nerve block solution was injected without difficulty or complication. The needle was removed intact. L3 Medial Branch Nerve Block (MBB): The target area for the L3 medial branch is at the junction of the postero-lateral aspect of the superior articular process and the superior, posterior, and medial edge of the transverse process of L4. Under fluoroscopic guidance, a Quincke needle was inserted until contact was made with os over the superior postero-lateral aspect of the pedicular shadow (target area). After negative aspiration for blood, 0.5 mL of the nerve block solution was injected without difficulty or complication. The needle was removed intact. L4 Medial Branch Nerve Block (MBB): The target area for the L4 medial branch is at the junction of the postero-lateral aspect of the superior articular process and the superior, posterior, and medial edge of the transverse process of L5. Under fluoroscopic guidance, a Quincke needle was inserted until contact was made with os over the superior postero-lateral aspect of the pedicular shadow (target area). After negative aspiration for blood, 0.5 mL of the nerve block solution was injected without difficulty or complication. The needle was removed intact. L5 Medial Branch Nerve Block (MBB): The target area for the L5 medial branch is at the junction of the postero-lateral aspect of the superior articular process and the superior, posterior, and medial edge of the sacral ala. Under fluoroscopic guidance, a Quincke needle was inserted until contact was made with os over the superior postero-lateral aspect of the pedicular shadow (target area). After negative aspiration for blood, 0.5 mL of the nerve block solution was injected without difficulty or complication. The needle was removed intact. S1 Medial Branch Nerve Block (MBB): The target area for the S1 medial branch is  at the posterior and inferior 6 o'clock position of the L5-S1 facet joint. Under fluoroscopic guidance, the Quincke needle inserted for the L5 MBB was redirected until contact was made with os over the inferior and postero aspect of the sacrum, at the  6 o' clock position under the L5-S1 facet joint (Target area). After negative aspiration for blood, 0.5 mL of the nerve block solution was injected without difficulty or complication. The needle was removed intact.  Nerve block solution: 0.2% PF-Ropivacaine + Triamcinolone (40 mg/mL) diluted to a final concentration of 4 mg of Triamcinolone/mL of Ropivacaine The unused portion of the solution was discarded in the proper designated containers. Procedural Needles: 22-gauge, 3.5-inch, Quincke needles used for all levels.  Once the entire procedure was completed, the treated area was cleaned, making sure to leave some of the prepping solution back to take advantage of its long term bactericidal properties.      Illustration of the posterior view of the lumbar spine and the posterior neural structures. Laminae of L2 through S1 are labeled. DPRL5, dorsal primary ramus of L5; DPRS1, dorsal primary ramus of S1; DPR3, dorsal primary ramus of L3; FJ, facet (zygapophyseal) joint L3-L4; I, inferior articular process of L4; LB1, lateral branch of dorsal primary ramus of L1; IAB, inferior articular branches from L3 medial branch (supplies L4-L5 facet joint); IBP, intermediate branch plexus; MB3, medial branch of dorsal primary ramus of L3; NR3, third lumbar nerve root; S, superior articular process of L5; SAB, superior articular branches from L4 (supplies L4-5 facet joint also); TP3, transverse process of L3.  Vitals:   03/24/21 1011 03/24/21 1043 03/24/21 1048 03/24/21 1053  BP: 128/85 138/74 105/75 139/79  Pulse: 90     Resp: 16 16 18 16   Temp: (!) 96.9 F (36.1 C)     TempSrc: Temporal     SpO2: (!) 75% 100% 100% 100%  Weight: 150 lb (68 kg)     Height: 5'  7" (1.702 m)        Start Time: 1044 hrs. End Time: 1053 hrs.  Imaging Guidance (Spinal):          Type of Imaging Technique: Fluoroscopy Guidance (Spinal) Indication(s): Assistance in needle guidance and placement for procedures requiring needle placement in or near specific anatomical locations not easily accessible without such assistance. Exposure Time: Please see nurses notes. Contrast: None used. Fluoroscopic Guidance: I was personally present during the use of fluoroscopy. "Tunnel Vision Technique" used to obtain the best possible view of the target area. Parallax error corrected before commencing the procedure. "Direction-depth-direction" technique used to introduce the needle under continuous pulsed fluoroscopy. Once target was reached, antero-posterior, oblique, and lateral fluoroscopic projection used confirm needle placement in all planes. Images permanently stored in EMR. Interpretation: No contrast injected. I personally interpreted the imaging intraoperatively. Adequate needle placement confirmed in multiple planes. Permanent images saved into the patient's record.  Antibiotic Prophylaxis:   Anti-infectives (From admission, onward)    None      Indication(s): None identified  Post-operative Assessment:  Post-procedure Vital Signs:  Pulse/HCG Rate: 9060 Temp: (!) 96.9 F (36.1 C) Resp: 16 BP: 139/79 SpO2: 100 %  EBL: None  Complications: No immediate post-treatment complications observed by team, or reported by patient.  Note: The patient tolerated the entire procedure well. A repeat set of vitals were taken after the procedure and the patient was kept under observation following institutional policy, for this type of procedure. Post-procedural neurological assessment was performed, showing return to baseline, prior to discharge. The patient was provided with post-procedure discharge instructions, including a section on how to identify potential problems. Should any  problems arise concerning this procedure, the patient was given instructions to immediately contact us, at any time, without hesitation. In any  case, we plan to contact the patient by telephone for a follow-up status report regarding this interventional procedure.  Comments:  No additional relevant information.  Plan of Care  Orders:  Orders Placed This Encounter  Procedures   LUMBAR FACET(MEDIAL BRANCH NERVE BLOCK) MBNB    Scheduling Instructions:     Procedure: Lumbar facet block (AKA.: Lumbosacral medial branch nerve block)     Side: Bilateral     Level: L3-4, L4-5, & L5-S1 Facets (L2, L3, L4, L5, & S1 Medial Branch Nerves)     Sedation: Patient's choice.     Timeframe: Today    Order Specific Question:   Where will this procedure be performed?    Answer:   [facility]   DG [facility] C-ARM 1-60 MIN NO REPORT    Intraoperative interpretation by procedural physician at [place].    Standing Status:   Standing    Number of Occurrences:   1    Order Specific Question:   Reason for exam:    Answer:   Assistance in needle guidance and placement for procedures requiring needle placement in or near specific anatomical locations not easily accessible without such assistance.   Informed Consent Details: Physician/Practitioner Attestation; Transcribe to consent form and obtain patient signature    Nursing Order: Transcribe to consent form and obtain patient signature. Note: Always confirm laterality of pain with [name], before procedure.    Order Specific Question:   Physician/Practitioner attestation of informed consent for procedure/surgical case    Answer:   I, the physician/practitioner, attest that I have discussed with the patient the benefits, risks, side effects, alternatives, likelihood of achieving goals and potential problems during recovery for the procedure that I have provided informed consent.    Order Specific Question:   Procedure    Answer:   Lumbar  Facet Block  under fluoroscopic guidance    Order Specific Question:   Physician/Practitioner performing the procedure    Answer:   Bentleigh Stankus A. Dossie Arbour MD    Order Specific Question:   Indication/Reason    Answer:   Low Back Pain, with our without leg pain, due to Facet Joint Arthralgia (Joint Pain) Spondylosis (Arthritis of the Spine), without myelopathy or radiculopathy (Nerve Damage).   Care order/instruction: Please confirm that the patient has stopped the Eliquis (Apixaban) x 3 days prior to procedure or surgery.    Please confirm that the patient has stopped the Eliquis (Apixaban) x 3 days prior to procedure or surgery.    Standing Status:   Standing    Number of Occurrences:   1   Provide equipment / supplies at bedside    "Block Tray" (Disposable  single use) Needle type: SpinalSpinal Amount/quantity: 4 Size: Regular (3.5-inch) Gauge: 22G    Standing Status:   Standing    Number of Occurrences:   1    Order Specific Question:   Specify    Answer:   Block Tray   Bleeding precautions    Standing Status:   Standing    Number of Occurrences:   1    Chronic Opioid Analgesic:  Oxycodone IR 10 mg, 1 tab PO BID (20 mg/day of oxycodone) MME/day: 30 mg/day.   Medications ordered for procedure: Meds ordered this encounter  Medications   lidocaine (XYLOCAINE) 2 % (with pres) injection 400 mg   ropivacaine (PF) 2 mg/mL (0.2%) (NAROPIN) injection 18 mL   triamcinolone acetonide (KENALOG-40) injection 80 mg    Medications administered: We administered  lidocaine, ropivacaine (PF) 2 mg/mL (0.2%), and triamcinolone acetonide.  See the medical record for exact dosing, route, and time of administration.  Follow-up plan:   Return in about 2 weeks (around 04/07/2021) for (Vv-PPE) PM Proc-day (T,Th).      Interventional Therapies  Risk  Complexity Considerations:   NOTE: ELIQUIS ANTICOAGULATION (Stop: x3 days prior to procedure  Restart 6 hours after procedures)   Planned  Pending:    Pending further evaluation   Under consideration:   Diagnostic right SI joint block  Possible bilateral SI joint RFA  Diagnostic bilateral femoral nerve + obturator NB  Possible bilateral femoral nerve + obturator nerve RFA  Diagnostic right IA shoulder joint injection     Completed:   Diagnostic/therapeutic right L2 TFESI x1 (01/31/2020)  Diagnostic/therapeutic left L4 TFESI x1 (01/31/2020) Diagnostic left SI joint block x2 (09/27/2019) Palliative left CESI x2 (10/13/2017) Palliative left trochanteric bursa injection x1 (03/27/2019)  Palliative right IA hip inj. x1  (100% on the right) (02/08/2019) Palliative left IA hip inj. x2 (02/08/2019) Palliative right lumbar facet MBB x2 (01/10/2020) Palliative left lumbar facet MBB x4 (01/10/2020) Palliative right lumbar facet RFA x1 (09/19/18) Palliative left lumbar facet RFA x3 (08/01/18) Diagnostic/therapeutic right suprascapular NB x3 (05/10/2017)  Palliative bilateral suprascapular nerve RFA x1 (07/13/18)   Therapeutic  Palliative (PRN) options:   Diagnostic left SI joint block #2  Palliative left CESI #3  Palliative left trochanteric bursa injection #2  Palliative right IA hip inj. #2  (100% on the right) Palliative left IA hip inj. #3   Palliative right lumbar facet block #3  Palliative left lumbar facet block #5  Palliative right lumbar facet RFA #2  Palliative left lumbar facet RFA #4  Palliative bilateral suprascapular nerve block   Palliative bilateral suprascapular nerve RFA #2      Recent Visits Date Type Provider Dept  03/04/21 Office Visit Milinda Pointer, MD [facility]  01/28/21 Telemedicine Milinda Pointer, MD [facility]  12/29/20 Telemedicine Milinda Pointer, MD [facility]  Showing recent visits within past 90 days and meeting all other requirements Today's Visits Date Type Provider Dept  03/24/21 Procedure visit Milinda Pointer, MD [facility]  Showing today's  visits and meeting all other requirements Future Appointments Date Type Provider Dept  04/07/21 Appointment Milinda Pointer, MD [facility]  05/27/21 Appointment Milinda Pointer, MD [facility]  Showing future appointments within next 90 days and meeting all other requirements Disposition: Discharge home  Discharge (Date  Time): 03/24/2021; 1105 hrs.   Primary Care Physician: Olin Hauser, DO Location: [facility] Note by: Gaspar Cola, MD Date: 03/24/2021; Time: 11:12 AM  Disclaimer:  Medicine is not an Chief Strategy Officer. The only guarantee in medicine is that nothing is guaranteed. It is important to note that the decision to proceed with this intervention was based on the information collected from the patient. The Data and conclusions were drawn from the patient's questionnaire, the interview, and the physical examination. Because the information was provided in large part by the patient, it cannot be guaranteed that it has not been purposely or unconsciously manipulated. Every effort has been made to obtain as much relevant data as possible for this evaluation. It is important to note that the conclusions that lead to this procedure are derived in large part from the available data. Always take into account that the treatment will also be dependent on availability of resources and existing treatment guidelines, considered  by other Pain Management Practitioners as being common knowledge and practice, at the time of the intervention. For Medico-Legal purposes, it is also important to point out that variation in procedural techniques and pharmacological choices are the acceptable norm. The indications, contraindications, technique, and results of the above procedure should only be interpreted and judged by a Board-Certified Interventional Pain Specialist with extensive familiarity and expertise in the same exact procedure and  technique.

## 2021-03-24 ENCOUNTER — Ambulatory Visit
Admission: RE | Admit: 2021-03-24 | Discharge: 2021-03-24 | Disposition: A | Discharge status: Home / Self Care | Payer: Medicare HMO | Source: Ambulatory Visit | Attending: Pain Medicine | Admitting: Pain Medicine

## 2021-03-24 ENCOUNTER — Ambulatory Visit (HOSPITAL_BASED_OUTPATIENT_CLINIC_OR_DEPARTMENT_OTHER): Payer: Medicare HMO | Admitting: Pain Medicine

## 2021-03-24 ENCOUNTER — Other Ambulatory Visit: Payer: Self-pay

## 2021-03-24 ENCOUNTER — Encounter: Payer: Self-pay | Admitting: Pain Medicine

## 2021-03-24 VITALS — BP 139/79 | HR 90 | Temp 96.9°F | Resp 16 | Ht 67.0 in | Wt 150.0 lb

## 2021-03-24 DIAGNOSIS — M4310 Spondylolisthesis, site unspecified: Secondary | ICD-10-CM | POA: Insufficient documentation

## 2021-03-24 DIAGNOSIS — G8929 Other chronic pain: Secondary | ICD-10-CM

## 2021-03-24 DIAGNOSIS — M47817 Spondylosis without myelopathy or radiculopathy, lumbosacral region: Secondary | ICD-10-CM | POA: Diagnosis not present

## 2021-03-24 DIAGNOSIS — M545 Low back pain, unspecified: Secondary | ICD-10-CM

## 2021-03-24 DIAGNOSIS — M47816 Spondylosis without myelopathy or radiculopathy, lumbar region: Secondary | ICD-10-CM | POA: Diagnosis not present

## 2021-03-24 DIAGNOSIS — Z7901 Long term (current) use of anticoagulants: Secondary | ICD-10-CM | POA: Insufficient documentation

## 2021-03-24 DIAGNOSIS — M5137 Other intervertebral disc degeneration, lumbosacral region: Secondary | ICD-10-CM

## 2021-03-24 MED ORDER — LIDOCAINE HCL 2 % IJ SOLN
20.0000 mL | Freq: Once | INTRAMUSCULAR | Status: AC
  Administered 2021-03-24: 200 mg
  Filled 2021-03-24: qty 20

## 2021-03-24 MED ORDER — TRIAMCINOLONE ACETONIDE 40 MG/ML IJ SUSP
80.0000 mg | Freq: Once | INTRAMUSCULAR | Status: AC
  Administered 2021-03-24: 80 mg
  Filled 2021-03-24: qty 2

## 2021-03-24 MED ORDER — ROPIVACAINE HCL 2 MG/ML IJ SOLN
18.0000 mL | Freq: Once | INTRAMUSCULAR | Status: AC
  Administered 2021-03-24: 18 mL via PERINEURAL

## 2021-03-24 NOTE — Progress Notes (Signed)
Safety precautions to be maintained throughout the outpatient stay will include: orient to surroundings, keep bed in low position, maintain call bell within reach at all times, provide assistance with transfer out of bed and ambulation.  

## 2021-03-24 NOTE — Patient Instructions (Signed)
Pain Management Discharge Instructions  General Discharge Instructions :  If you need to reach your doctor call: Monday-Friday 8:00 am - 4:00 pm at [phone] or toll free [phone].  After clinic hours [phone] to have operator reach doctor.  Bring all of your medication bottles to all your appointments in the pain clinic.  To cancel or reschedule your appointment with Pain Management please remember to call 24 hours in advance to avoid a fee.  Refer to the educational materials which you have been given on: General Risks, I had my Procedure. Discharge Instructions, Post Sedation.  Post Procedure Instructions:  The drugs you were given will stay in your system until tomorrow, so for the next 24 hours you should not drive, make any legal decisions or drink any alcoholic beverages.  You may eat anything you prefer, but it is better to start with liquids then soups and crackers, and gradually work up to solid foods.  Please notify your doctor immediately if you have any unusual bleeding, trouble breathing or pain that is not related to your normal pain.  Depending on the type of procedure that was done, some parts of your body may feel week and/or numb.  This usually clears up by tonight or the next day.  Walk with the use of an assistive device or accompanied by an adult for the 24 hours.  You may use ice on the affected area for the first 24 hours.  Put ice in a Ziploc bag and cover with a towel and place against area 15 minutes on 15 minutes off.  You may switch to heat after 24 hours.Facet Blocks Patient Information  Description: The facets are joints in the spine between the vertebrae.  Like any joints in the body, facets can become irritated and painful.  Arthritis can also effect the facets.  By injecting steroids and local anesthetic in and around these joints, we can temporarily block the nerve supply to them.  Steroids act directly on irritated nerves and tissues to  reduce selling and inflammation which often leads to decreased pain.  Facet blocks may be done anywhere along the spine from the neck to the low back depending upon the location of your pain.   After numbing the skin with local anesthetic (like Novocaine), a small needle is passed onto the facet joints under x-ray guidance.  You may experience a sensation of pressure while this is being done.  The entire block usually lasts about 15-25 minutes.   Conditions which may be treated by facet blocks:  Low back/buttock pain Neck/shoulder pain Certain types of headaches  Preparation for the injection:  Do not eat any solid food or dairy products within 8 hours of your appointment. You may drink clear liquid up to 3 hours before appointment.  Clear liquids include water, black coffee, juice or soda.  No milk or cream please. You may take your regular medication, including pain medications, with a sip of water before your appointment.  Diabetics should hold regular insulin (if taken separately) and take 1/2 normal NPH dose the morning of the procedure.  Carry some sugar containing items with you to your appointment. A driver must accompany you and be prepared to drive you home after your procedure. Bring all your current medications with you. An IV may be inserted and sedation may be given at the discretion of the physician. A blood pressure cuff, EKG and other monitors will often be applied during the procedure.  Some patients may need to   have extra oxygen administered for a short period. You will be asked to provide medical information, including your allergies and medications, prior to the procedure.  We must know immediately if you are taking blood thinners (like Coumadin/Warfarin) or if you are allergic to IV iodine contrast (dye).  We must know if you could possible be pregnant.  Possible side-effects:  Bleeding from needle site Infection (rare, may require surgery) Nerve injury (rare) Numbness  & tingling (temporary) Difficulty urinating (rare, temporary) Spinal headache (a headache worse with upright posture) Light-headedness (temporary) Pain at injection site (serveral days) Decreased blood pressure (rare, temporary) Weakness in arm/leg (temporary) Pressure sensation in back/neck (temporary)   Call if you experience:  Fever/chills associated with headache or increased back/neck pain Headache worsened by an upright position New onset, weakness or numbness of an extremity below the injection site Hives or difficulty breathing (go to the emergency room) Inflammation or drainage at the injection site(s) Severe back/neck pain greater than usual New symptoms which are concerning to you  Please note:  Although the local anesthetic injected can often make your back or neck feel good for several hours after the injection, the pain will likely return. It takes 3-7 days for steroids to work.  You may not notice any pain relief for at least one week.  If effective, we will often do a series of 2-3 injections spaced 3-6 weeks apart to maximally decrease your pain.  After the initial series, you may be a candidate for a more permanent nerve block of the facets.  If you have any questions, please call [phone] [facility] 

## 2021-03-25 ENCOUNTER — Telehealth: Payer: Self-pay

## 2021-03-25 ENCOUNTER — Ambulatory Visit: Payer: Self-pay

## 2021-03-25 NOTE — Telephone Encounter (Signed)
Post procedure phone call.  LM 

## 2021-03-26 ENCOUNTER — Encounter: Payer: Medicare HMO | Attending: Physician Assistant

## 2021-03-26 ENCOUNTER — Other Ambulatory Visit: Payer: Self-pay

## 2021-03-26 DIAGNOSIS — L97322 Non-pressure chronic ulcer of left ankle with fat layer exposed: Secondary | ICD-10-CM | POA: Insufficient documentation

## 2021-03-26 DIAGNOSIS — I872 Venous insufficiency (chronic) (peripheral): Secondary | ICD-10-CM | POA: Diagnosis not present

## 2021-03-26 DIAGNOSIS — I890 Lymphedema, not elsewhere classified: Secondary | ICD-10-CM | POA: Insufficient documentation

## 2021-03-26 DIAGNOSIS — B1920 Unspecified viral hepatitis C without hepatic coma: Secondary | ICD-10-CM | POA: Diagnosis not present

## 2021-03-26 DIAGNOSIS — N189 Chronic kidney disease, unspecified: Secondary | ICD-10-CM | POA: Insufficient documentation

## 2021-03-26 DIAGNOSIS — Z7901 Long term (current) use of anticoagulants: Secondary | ICD-10-CM | POA: Diagnosis not present

## 2021-03-26 DIAGNOSIS — J449 Chronic obstructive pulmonary disease, unspecified: Secondary | ICD-10-CM | POA: Insufficient documentation

## 2021-03-26 DIAGNOSIS — I130 Hypertensive heart and chronic kidney disease with heart failure and stage 1 through stage 4 chronic kidney disease, or unspecified chronic kidney disease: Secondary | ICD-10-CM | POA: Insufficient documentation

## 2021-03-26 DIAGNOSIS — G9009 Other idiopathic peripheral autonomic neuropathy: Secondary | ICD-10-CM | POA: Diagnosis not present

## 2021-03-26 DIAGNOSIS — I509 Heart failure, unspecified: Secondary | ICD-10-CM | POA: Insufficient documentation

## 2021-03-26 DIAGNOSIS — L97522 Non-pressure chronic ulcer of other part of left foot with fat layer exposed: Secondary | ICD-10-CM | POA: Diagnosis not present

## 2021-03-26 DIAGNOSIS — I2510 Atherosclerotic heart disease of native coronary artery without angina pectoris: Secondary | ICD-10-CM | POA: Diagnosis not present

## 2021-03-26 DIAGNOSIS — I480 Paroxysmal atrial fibrillation: Secondary | ICD-10-CM | POA: Diagnosis not present

## 2021-04-02 ENCOUNTER — Other Ambulatory Visit: Payer: Self-pay

## 2021-04-02 ENCOUNTER — Encounter: Payer: Medicare HMO | Admitting: Physician Assistant

## 2021-04-02 ENCOUNTER — Ambulatory Visit (INDEPENDENT_AMBULATORY_CARE_PROVIDER_SITE_OTHER): Payer: Medicare HMO | Admitting: Family Medicine

## 2021-04-02 ENCOUNTER — Encounter: Payer: Self-pay | Admitting: Family Medicine

## 2021-04-02 VITALS — BP 116/63 | HR 63 | Ht 67.0 in | Wt 144.8 lb

## 2021-04-02 DIAGNOSIS — B1920 Unspecified viral hepatitis C without hepatic coma: Secondary | ICD-10-CM | POA: Diagnosis not present

## 2021-04-02 DIAGNOSIS — M5137 Other intervertebral disc degeneration, lumbosacral region: Secondary | ICD-10-CM

## 2021-04-02 DIAGNOSIS — M51379 Other intervertebral disc degeneration, lumbosacral region without mention of lumbar back pain or lower extremity pain: Secondary | ICD-10-CM

## 2021-04-02 DIAGNOSIS — N3001 Acute cystitis with hematuria: Secondary | ICD-10-CM

## 2021-04-02 DIAGNOSIS — R350 Frequency of micturition: Secondary | ICD-10-CM | POA: Diagnosis not present

## 2021-04-02 DIAGNOSIS — N189 Chronic kidney disease, unspecified: Secondary | ICD-10-CM | POA: Diagnosis not present

## 2021-04-02 DIAGNOSIS — I5032 Chronic diastolic (congestive) heart failure: Secondary | ICD-10-CM | POA: Diagnosis not present

## 2021-04-02 DIAGNOSIS — I129 Hypertensive chronic kidney disease with stage 1 through stage 4 chronic kidney disease, or unspecified chronic kidney disease: Secondary | ICD-10-CM

## 2021-04-02 DIAGNOSIS — L97322 Non-pressure chronic ulcer of left ankle with fat layer exposed: Secondary | ICD-10-CM | POA: Diagnosis not present

## 2021-04-02 DIAGNOSIS — L97522 Non-pressure chronic ulcer of other part of left foot with fat layer exposed: Secondary | ICD-10-CM | POA: Diagnosis not present

## 2021-04-02 DIAGNOSIS — I130 Hypertensive heart and chronic kidney disease with heart failure and stage 1 through stage 4 chronic kidney disease, or unspecified chronic kidney disease: Secondary | ICD-10-CM | POA: Diagnosis not present

## 2021-04-02 DIAGNOSIS — J9611 Chronic respiratory failure with hypoxia: Secondary | ICD-10-CM

## 2021-04-02 DIAGNOSIS — I4891 Unspecified atrial fibrillation: Secondary | ICD-10-CM

## 2021-04-02 DIAGNOSIS — I509 Heart failure, unspecified: Secondary | ICD-10-CM | POA: Diagnosis not present

## 2021-04-02 DIAGNOSIS — I4892 Unspecified atrial flutter: Secondary | ICD-10-CM

## 2021-04-02 DIAGNOSIS — J432 Centrilobular emphysema: Secondary | ICD-10-CM

## 2021-04-02 DIAGNOSIS — N183 Chronic kidney disease, stage 3 unspecified: Secondary | ICD-10-CM

## 2021-04-02 DIAGNOSIS — G894 Chronic pain syndrome: Secondary | ICD-10-CM | POA: Diagnosis not present

## 2021-04-02 DIAGNOSIS — J449 Chronic obstructive pulmonary disease, unspecified: Secondary | ICD-10-CM | POA: Diagnosis not present

## 2021-04-02 DIAGNOSIS — I890 Lymphedema, not elsewhere classified: Secondary | ICD-10-CM | POA: Diagnosis not present

## 2021-04-02 DIAGNOSIS — Z0000 Encounter for general adult medical examination without abnormal findings: Secondary | ICD-10-CM

## 2021-04-02 DIAGNOSIS — G9009 Other idiopathic peripheral autonomic neuropathy: Secondary | ICD-10-CM | POA: Diagnosis not present

## 2021-04-02 LAB — BASIC METABOLIC PANEL WITH GFR
BUN/Creatinine Ratio: 40 (calc) — ABNORMAL HIGH (ref 6–22)
BUN: 56 mg/dL — ABNORMAL HIGH (ref 7–25)
CO2: 23 mmol/L (ref 20–32)
Calcium: 10.2 mg/dL (ref 8.6–10.4)
Chloride: 107 mmol/L (ref 98–110)
Creat: 1.4 mg/dL — ABNORMAL HIGH (ref 0.60–0.95)
Glucose, Bld: 86 mg/dL (ref 65–99)
Potassium: 5.5 mmol/L — ABNORMAL HIGH (ref 3.5–5.3)
Sodium: 137 mmol/L (ref 135–146)
eGFR: 36 mL/min/{1.73_m2} — ABNORMAL LOW (ref >=60)

## 2021-04-02 LAB — LIPID PANEL
Cholesterol: 158 mg/dL (ref <200)
HDL: 68 mg/dL (ref >=50)
LDL Cholesterol (Calc): 77 mg/dL (calc)
Non-HDL Cholesterol (Calc): 90 mg/dL (calc) (ref <130)
Total CHOL/HDL Ratio: 2.3 (calc) (ref <5.0)
Triglycerides: 57 mg/dL (ref <150)

## 2021-04-02 LAB — POCT URINALYSIS DIPSTICK
Bilirubin, UA: NEGATIVE
Glucose, UA: NEGATIVE
Ketones, UA: NEGATIVE
Nitrite, UA: NEGATIVE
Protein, UA: POSITIVE — AB
Spec Grav, UA: 1.015 (ref 1.010–1.025)
Urobilinogen, UA: 0.2 E.U./dL
pH, UA: 5 (ref 5.0–8.0)

## 2021-04-02 LAB — CBC WITH DIFFERENTIAL/PLATELET
Absolute Monocytes: 842 cells/uL (ref 200–950)
Basophils Absolute: 30 cells/uL (ref 0–200)
Basophils Relative: 0.3 %
Eosinophils Absolute: 238 cells/uL (ref 15–500)
Eosinophils Relative: 2.4 %
HCT: 41.8 % (ref 35.0–45.0)
Hemoglobin: 13.2 g/dL (ref 11.7–15.5)
Lymphs Abs: 2723 cells/uL (ref 850–3900)
MCH: 29.9 pg (ref 27.0–33.0)
MCHC: 31.6 g/dL — ABNORMAL LOW (ref 32.0–36.0)
MCV: 94.8 fL (ref 80.0–100.0)
MPV: 10.1 fL (ref 7.5–12.5)
Monocytes Relative: 8.5 %
Neutro Abs: 6069 cells/uL (ref 1500–7800)
Neutrophils Relative %: 61.3 %
Platelets: 184 10*3/uL (ref 140–400)
RBC: 4.41 10*6/uL (ref 3.80–5.10)
RDW: 12.6 % (ref 11.0–15.0)
Total Lymphocyte: 27.5 %
WBC: 9.9 10*3/uL (ref 3.8–10.8)

## 2021-04-02 MED ORDER — GABAPENTIN 100 MG PO CAPS: 300.0000 mg | ORAL_CAPSULE | Freq: Every day | ORAL | 1 refills | Status: DC

## 2021-04-02 MED ORDER — AMOXICILLIN-POT CLAVULANATE 875-125 MG PO TABS: 1.0000 | ORAL_TABLET | Freq: Two times a day (BID) | ORAL | 0 refills | Status: DC

## 2021-04-02 NOTE — Progress Notes (Signed)
Dowding, Izetta L. ([id number]) Visit Report for 04/02/2021 Arrival Information Details Patient Name: [name], [name] Date of Service: 04/02/2021 12:30 PM Medical Record Number: [id number] Patient Account Number: [id number] Date of Birth/Sex: [birth year] (87 y.o. F) Treating RN: Donnamarie Poag Primary Care Travis Purk: [name] Other Clinician: Referring Dacota Ruben: [name] Treating Daaron Dimarco/Extender: [name] in Treatment: 12 Visit Information History Since Last Visit Added or deleted any medications: No Patient Arrived: Kristin [name] Had a fall or experienced change in No Arrival Time: 12:40 activities of daily living that may affect Accompanied By: self risk of falls: Transfer Assistance: None Hospitalized since last visit: No Patient Identification Verified: Yes Has Dressing in Place as Prescribed: Yes Secondary Verification Process Completed: Yes Has Compression in Place as Prescribed: Yes Patient Requires Transmission-Based No Pain Present Now: No Precautions: Patient Has Alerts: Yes Patient Alerts: Patient on Blood Thinner ELIQUIS and ASPIRIN NOT diabetic ABI left 0.81 @vascular  ABI right 0.83 @vascular  Electronic Signature(s) Signed: 04/02/2021 2:04:00 PM By: Donnamarie Poag Entered ByDonnamarie Poag on 04/02/2021 12:41:40 [name], [name] ([id number]) -------------------------------------------------------------------------------- Clinic Level of Care Assessment Details Patient Name: Fehnel, Jenness L. Date of Service: 04/02/2021 12:30 PM Medical Record Number: [id number] Patient Account Number: [id number] Date of Birth/Sex: November 25, 1929 (87 y.o. F) Treating RN: Donnamarie Poag Primary Care Jaimya Feliciano: [name] Other Clinician: Referring Raenah Murley: [name] Treating Riona Lahti/Extender: [name] in Treatment: 12 Clinic Level of Care Assessment Items TOOL 4 Quantity Score []  - Use when only an EandM is performed on FOLLOW-UP visit  0 ASSESSMENTS - Nursing Assessment / Reassessment []  - Reassessment of Co-morbidities (includes updates in patient status) 0 []  - 0 Reassessment of Adherence to Treatment Plan ASSESSMENTS - Wound and Skin Assessment / Reassessment X - Simple Wound Assessment / Reassessment - one wound 1 5 []  - 0 Complex Wound Assessment / Reassessment - multiple wounds []  - 0 Dermatologic / Skin Assessment (not related to wound area) ASSESSMENTS - Focused Assessment X - Circumferential Edema Measurements - multi extremities 1 5 []  - 0 Nutritional Assessment / Counseling / Intervention []  - 0 Lower Extremity Assessment (monofilament, tuning fork, pulses) []  - 0 Peripheral Arterial Disease Assessment (using hand held doppler) ASSESSMENTS - Ostomy and/or Continence Assessment and Care []  - Incontinence Assessment and Management 0 []  - 0 Ostomy Care Assessment and Management (repouching, etc.) PROCESS - Coordination of Care X - Simple Patient / Family Education for ongoing care 1 15 []  - 0 Complex (extensive) Patient / Family Education for ongoing care []  - 0 Staff obtains Programmer, systems, Records, Test Results / Process Orders []  - 0 Staff telephones HHA, Nursing Homes / Clarify orders / etc []  - 0 Routine Transfer to another Facility (non-emergent condition) []  - 0 Routine Hospital Admission (non-emergent condition) []  - 0 New Admissions / Biomedical engineer / Ordering NPWT, Apligraf, etc. []  - 0 Emergency Hospital Admission (emergent condition) X- 1 10 Simple Discharge Coordination []  - 0 Complex (extensive) Discharge Coordination PROCESS - Special Needs []  - Pediatric / Minor Patient Management 0 []  - 0 Isolation Patient Management []  - 0 Hearing / Language / Visual special needs []  - 0 Assessment of Community assistance (transportation, D/C planning, etc.) []  - 0 Additional assistance / Altered mentation []  - 0 Support Surface(s) Assessment (bed, cushion, seat,  etc.) INTERVENTIONS - Wound Cleansing / Measurement Shaikh, Chiffon L. ([id number]) X- 1 5 Simple Wound Cleansing - one wound []  - 0 Complex Wound Cleansing - multiple wounds X- 1 5 Wound Imaging (photographs - any  number of wounds) []  - 0 Wound Tracing (instead of photographs) X- 1 5 Simple Wound Measurement - one wound []  - 0 Complex Wound Measurement - multiple wounds INTERVENTIONS - Wound Dressings X - Small Wound Dressing one or multiple wounds 1 10 []  - 0 Medium Wound Dressing one or multiple wounds []  - 0 Large Wound Dressing one or multiple wounds []  - 0 Application of Medications - topical []  - 0 Application of Medications - injection INTERVENTIONS - Miscellaneous []  - External ear exam 0 []  - 0 Specimen Collection (cultures, biopsies, blood, body fluids, etc.) []  - 0 Specimen(s) / Culture(s) sent or taken to Lab for analysis []  - 0 Patient Transfer (multiple staff / Civil Service fast streamer / Similar devices) []  - 0 Simple Staple / Suture removal (25 or less) []  - 0 Complex Staple / Suture removal (26 or more) []  - 0 Hypo / Hyperglycemic Management (close monitor of Blood Glucose) []  - 0 Ankle / Brachial Index (ABI) - do not check if billed separately X- 1 5 Vital Signs Has the patient been seen at the hospital within the last three years: Yes Total Score: 65 Level Of Care: New/Established - Level 2 Electronic Signature(s) Signed: 04/02/2021 2:04:00 PM By: Donnamarie Poag Entered ByDonnamarie Poag on 04/02/2021 13:14:22 [name], [name] ([id number]) -------------------------------------------------------------------------------- Encounter Discharge Information Details Patient Name: [name], [name] Date of Service: 04/02/2021 12:30 PM Medical Record Number: [id number] Patient Account Number: [id number] Date of Birth/Sex: June 06, 1930 (87 y.o. F) Treating RN: Donnamarie Poag Primary Care Makeya Hilgert: [name] Other Clinician: Referring Alayna Mabe: [name] Treating Fantasha Daniele/Extender: [name] in Treatment: 12 Encounter Discharge Information Items Discharge Condition: Stable Ambulatory Status: Cane Discharge Destination: Home Transportation: Private Auto Accompanied By: self Schedule Follow-up Appointment: Yes Clinical Summary of Care: Electronic Signature(s) Signed: 04/02/2021 2:04:00 PM By: Donnamarie Poag Entered ByDonnamarie Poag on 04/02/2021 13:20:31 Ruffini, Alyce L. ([id number]) -------------------------------------------------------------------------------- Lower Extremity Assessment Details Patient Name: [name], [name] Date of Service: 04/02/2021 12:30 PM Medical Record Number: [id number] Patient Account Number: [id number] Date of Birth/Sex: May 13, 1930 (87 y.o. F) Treating RN: Donnamarie Poag Primary Care Ismahan Lippman: [name] Other Clinician: Referring Drishti Pepperman: [name] Treating Angeleah Labrake/Extender: [name] in Treatment: 12 Edema Assessment Assessed: [Left: Yes] [Right: No] Edema: [Left: N] [Right: o] Calf Left: Right: Point of Measurement: 33 cm From Medial Instep 31 cm Ankle Left: Right: Point of Measurement: 9 cm From Medial Instep 21 cm Knee To Floor Left: Right: From Medial Instep 44 cm Vascular Assessment Pulses: Dorsalis Pedis Palpable: [Left:Yes] Electronic Signature(s) Signed: 04/02/2021 2:04:00 PM By: Donnamarie Poag Entered ByDonnamarie Poag on 04/02/2021 12:51:06 Nordmann, Darcell L. ([id number]) -------------------------------------------------------------------------------- Multi Wound Chart Details Patient Name: [name], [name] Date of Service: 04/02/2021 12:30 PM Medical Record Number: [id number] Patient Account Number: [id number] Date of Birth/Sex: [birth year] (87 y.o. F) Treating RN: Donnamarie Poag Primary Care Bethzaida Boord: [name] Other Clinician: Referring Kierstan Auer: [name] Treating Briaunna Grindstaff/Extender: [name] in Treatment: 12 Vital  Signs Height(in): 49 Pulse(bpm): 76 Weight(lbs): 147 Blood Pressure(mmHg): 115/67 Body Mass Index(BMI): 23 Temperature(F): 97.9 Respiratory Rate(breaths/min): 16 Photos: [N/A:N/A] Wound Location: Left Malleolus N/A N/A Wounding Event: Gradually Appeared N/A N/A Primary Etiology: Neuropathic Ulcer-Non Diabetic N/A N/A Comorbid History: Cataracts, Chronic Obstructive N/A N/A Pulmonary Disease (COPD), Arrhythmia, Congestive Heart Failure, Coronary Artery Disease, Hypertension, Hepatitis C, History of pressure wounds, Gout, Osteoarthritis, Received Chemotherapy, Received Radiation Date Acquired: 09/23/2020 N/A N/A Weeks of Treatment: 12 N/A N/A Wound Status: Open N/A N/A Wound Recurrence: Yes  N/A N/A Measurements L x W x D (cm) 0.1x0.1x0.1 N/A N/A Area (cm) : 0.008 N/A N/A Volume (cm) : 0.001 N/A N/A % Reduction in Area: 98.30% N/A N/A % Reduction in Volume: 97.90% N/A N/A Classification: Full Thickness Without Exposed N/A N/A Support Structures Exudate Amount: None Present N/A N/A Granulation Amount: None Present (0%) N/A N/A Necrotic Amount: None Present (0%) N/A N/A Exposed Structures: Fascia: No N/A N/A Fat Layer (Subcutaneous Tissue): No Tendon: No Muscle: No Joint: No Bone: No Epithelialization: Large (67-100%) N/A N/A Treatment Notes Electronic Signature(s) Signed: 04/02/2021 2:04:00 PM By: Donnamarie Poag Entered ByDonnamarie Poag on 04/02/2021 12:51:28 [name], [name] ([id number]) [name], [name] ([id number]) -------------------------------------------------------------------------------- [place] Details Patient Name: [name], [name] Date of Service: 04/02/2021 12:30 PM Medical Record Number: [id number] Patient Account Number: [id number] Date of Birth/Sex: June 09, 1930 (87 y.o. F) Treating RN: Donnamarie Poag Primary Care Nakeita Styles: [name] Other Clinician: Referring Daniyah Fohl: [name] Treating Shadae Reino/Extender: [name] in Treatment: 12 Active Inactive Electronic Signature(s) Signed: 04/02/2021 2:04:00 PM By: Donnamarie Poag Entered ByDonnamarie Poag on 04/02/2021 12:51:20 [name], [name] ([id number]) -------------------------------------------------------------------------------- Pain Assessment Details Patient Name: [name], [name] Date of Service: 04/02/2021 12:30 PM Medical Record Number: [id number] Patient Account Number: [id number] Date of Birth/Sex: 08-Jan-1930 (87 y.o. F) Treating RN: Donnamarie Poag Primary Care Elizar Alpern: [name] Other Clinician: Referring Blayne Garlick: [name] Treating Durante Violett/Extender: [name] in Treatment: 12 Active Problems Location of Pain Severity and Description of Pain Patient Has Paino No Site Locations Rate the pain. Current Pain Level: 0 Pain Management and Medication Current Pain Management: Electronic Signature(s) Signed: 04/02/2021 2:04:00 PM By: Donnamarie Poag Entered By: Donnamarie Poag on 04/02/2021 12:44:20 [name], [name] ([id number]) -------------------------------------------------------------------------------- Patient/Caregiver Education Details Patient Name: [name], [name] Date of Service: 04/02/2021 12:30 PM Medical Record Number: [id number] Patient Account Number: [id number] Date of Birth/Gender: 1930/05/11 (87 y.o. F) Treating RN: Donnamarie Poag Primary Care Physician: [name] Other Clinician: Referring Physician: [name] Treating Physician/Extender: [name] in Treatment: 12 Education Assessment Education Provided To: Patient Education Topics Provided Basic Hygiene: Venous: Wound/Skin Impairment: Engineer, maintenance) Signed: 04/02/2021 2:04:00 PM By: Donnamarie Poag Entered ByDonnamarie Poag on 04/02/2021 13:10:18 Plyler, [name] ([id number]) -------------------------------------------------------------------------------- Wound Assessment Details Patient Name: Swantek, Eriyana  L. Date of Service: 04/02/2021 12:30 PM Medical Record Number: [id number] Patient Account Number: [id number] Date of Birth/Sex: 07-Aug-1930 (87 y.o. F) Treating RN: Donnamarie Poag Primary Care Lizmary Nader: [name] Other Clinician: Referring Darleen Moffitt: [name] Treating Gladiola Madore/Extender: [name] in Treatment: 12 Wound Status Wound Number: 1R Primary Neuropathic Ulcer-Non Diabetic Etiology: Wound Location: Left Malleolus Wound Healed - Epithelialized Wounding Event: Gradually Appeared Status: Date Acquired: 09/23/2020 Comorbid Cataracts, Chronic Obstructive Pulmonary Disease (COPD), Weeks Of Treatment: 12 History: Arrhythmia, Congestive Heart Failure, Coronary Artery Clustered Wound: No Disease, Hypertension, Hepatitis C, History of pressure wounds, Gout, Osteoarthritis, Received Chemotherapy, Received Radiation Photos Wound Measurements Length: (cm) 0 Width: (cm) 0 Depth: (cm) 0 Area: (cm) 0 Volume: (cm) 0 % Reduction in Area: 100% % Reduction in Volume: 100% Epithelialization: Large (67-100%) Tunneling: No Undermining: No Wound Description Classification: Full Thickness Without Exposed Support Structure Exudate Amount: None Present s Foul Odor After Cleansing: No Slough/Fibrino No Wound Bed Granulation Amount: None Present (0%) Exposed Structure Necrotic Amount: None Present (0%) Fascia Exposed: No Fat Layer (Subcutaneous Tissue) Exposed: No Tendon Exposed: No Muscle Exposed: No Joint Exposed: No Bone Exposed: No Treatment Notes Wound #1R (Malleolus) Wound Laterality: Left Cleanser Peri-Wound  Care Topical Ebright, Riki L. ([id number]) Primary Dressing Secondary Dressing Secured With Compression Wrap Compression Stockings Add-Ons Electronic Signature(s) Signed: 04/02/2021 2:04:00 PM By: Donnamarie Poag Entered By: Donnamarie Poag on 04/02/2021 13:13:33 Altemus, Dorella L.  ([id number]) -------------------------------------------------------------------------------- Vitals Details Patient Name: Samudio, Shannel L. Date of Service: 04/02/2021 12:30 PM Medical Record Number: [id number] Patient Account Number: [id number] Date of Birth/Sex: 05/03/1930 (87 y.o. F) Treating RN: Donnamarie Poag Primary Care Ciella Obi: [name] Other Clinician: Referring Harmon Bommarito: [name] Treating Ceasar Decandia/Extender: [name] in Treatment: 12 Vital Signs Time Taken: 12:42 Temperature (F): 97.9 Height (in): 67 Pulse (bpm): 74 Weight (lbs): 147 Respiratory Rate (breaths/min): 16 Body Mass Index (BMI): 23 Blood Pressure (mmHg): 115/67 Reference Range: 80 - 120 mg / dl Electronic Signature(s) Signed: 04/02/2021 2:04:00 PM By: Donnamarie Poag Entered ByDonnamarie Poag on 04/02/2021 12:43:59

## 2021-04-02 NOTE — Progress Notes (Addendum)
Piascik, Mossie L. ([id number]) Visit Report for 04/02/2021 Chief Complaint Document Details Patient Name: Kristin [name], [name] Date of Service: 04/02/2021 12:30 PM Medical Record Number: [id number] Patient Account Number: [id number] Date of Birth/Sex: 04-24-1930 (87 y.o. F) Treating RN: Donnamarie Poag Primary Care Provider: [name] Other Clinician: Referring Provider: [name] Treating Provider/Extender: [name] in Treatment: 12 Information Obtained from: Patient Chief Complaint Left ankle and left foot ulcers Electronic Signature(s) Signed: 04/02/2021 12:59:32 PM By: Worthy Keeler PA-C Entered By: Worthy Keeler on 04/02/2021 12:59:32 Kristin [name], [name] ([id number]) -------------------------------------------------------------------------------- HPI Details Patient Name: Kristin [name], [name] Date of Service: 04/02/2021 12:30 PM Medical Record Number: [id number] Patient Account Number: [id number] Date of Birth/Sex: 03/29/1930 (87 y.o. F) Treating RN: Donnamarie Poag Primary Care Provider: [name] Other Clinician: Referring Provider: [name] Treating Provider/Extender: [name] in Treatment: 12 History of Present Illness HPI Description: Left 0.81 Right 0.83 12/25/20 About a year but patient is not sure how long for sure 01/06/2021 upon evaluation today patient presents for initial inspection here in our clinic concerning issues she has been having in 2 areas one being on her left lateral malleolus. This is the most significant of the 2 regions based on what I am seeing today. The other is actually over her left dorsal 3rd toe. With that being said the patient tells me that currently she has been tolerating the dressing changes without complication. There does not appear to be any signs of obvious infection at this time but nonetheless she does not seem to be doing a lot better. She is mainly just been putting a cream on the areas but  she is not sure what cream this was when I questioned her about that today she states "it may be on my med list" although I did not see that. The patient is really not a very good historian in general concerning how long this has been going on and what exactly is going on. The patient does have a history of neuropathy, chronic kidney disease, atrial fibrillation and is on long-term anticoagulant therapy as well as aspirin. This includes Eliquis. 5/24; patient has an open area on the left lateral malleolus as well as an area on the base of her third toe which is mycotic also on the left. We have been using Hydrofera Blue as of last week. Biopsy that was done showed chronic stasis dermatitis with areas of healing erosion and overlying inflammatory crust. Negative for malignancy and negative for fungal organisms. X-ray of the foot did not show anything abnormal except degenerative changes 6/1; left lateral malleolus wound had a lot of callus and dry skin on the surface. I remove this there is only 2 small open areas. The base of the left third toe at the base of the mycotic nail also required debridement we have been using silver alginate in both areas 01/27/2021 upon evaluation today patient appears to be doing decently well in regard to her wounds. In fact she appears to be healed based on what I am seeing at this point. I do not see any signs of active infection at this time. Readmission: 02/26/2021 upon evaluation today patient unfortunately has a reopening of her wound on her leg. She has on the left lateral ankle noted wound has been open for about a week. She tells me that she has not been wearing compression since we discharged her labs under the impression that she had compression stockings and would be wearing those. Nonetheless it  does not appear to be the case. I do believe this may be very likely why this reopen. Fortunately there does not appear to be any signs of active infection at this  time. No fevers, chills, nausea, vomiting, or diarrhea. 03/05/2021 upon evaluation today patient appears to be doing well with regard to the wound on her ankle region. She has been tolerating the dressing changes without complication. Fortunately there is no evidence of active infection which is great news. 03/13/2021 upon evaluation today patient appears to be doing well with regard to her wound. She has been tolerating the dressing changes without complication and very pleased in that regard. There does not appear to be any signs of active infection which is great news and overall I think the patient is making excellent progress. 03/19/2021 upon evaluation today patient appears to be doing well with regard to her ankle ulcer. She has been tolerating the dressing changes without complication and overall I think she is actually making excellent progress. This is measuring significantly smaller today as compared to last time I saw her. 04/02/2021 upon evaluation today patient appears to be doing well with regard to her ankle ulcer. She has been tolerating the dressing changes without complication. Fortunately there is no signs of active infection at this time. No fevers, chills, nausea, vomiting, or diarrhea. With that being said the patient tells me that she is doing well and is having no pain which is also great news. Electronic Signature(s) Signed: 04/02/2021 1:17:26 PM By: Worthy Keeler PA-C Entered By: Worthy Keeler on 04/02/2021 13:17:26 Kristin [name], [name] ([id number]) -------------------------------------------------------------------------------- Physical Exam Details Patient Name: [name], [name] Date of Service: 04/02/2021 12:30 PM Medical Record Number: [id number] Patient Account Number: [id number] Date of Birth/Sex: 1929-10-02 (87 y.o. F) Treating RN: Donnamarie Poag Primary Care Provider: [name] Other Clinician: Referring Provider: [name] Treating  Provider/Extender: [name] in Treatment: 35 Constitutional Well-nourished and well-hydrated in no acute distress. Respiratory normal breathing without difficulty. Psychiatric this patient is able to make decisions and demonstrates good insight into disease process. Alert and Oriented x 3. pleasant and cooperative. Notes Upon inspection patient's wound bed actually showed signs of good granulation epithelization at this point. There does not appear to be any signs of active infection which is great news and overall very pleased with where things stand. In fact this appears to be completely healed which is awesome. Electronic Signature(s) Signed: 04/02/2021 1:17:44 PM By: Worthy Keeler PA-C Entered By: Worthy Keeler on 04/02/2021 13:17:44 Kristin [name], [name] ([id number]) -------------------------------------------------------------------------------- Physician Orders Details Patient Name: Defibaugh, Louise L. Date of Service: 04/02/2021 12:30 PM Medical Record Number: [id number] Patient Account Number: [id number] Date of Birth/Sex: 1930-03-18 (87 y.o. F) Treating RN: Donnamarie Poag Primary Care Provider: [name] Other Clinician: Referring Provider: [name] Treating Provider/Extender: [name] in Treatment: 12 Verbal / Phone Orders: No Diagnosis Coding ICD-10 Coding Code Description G90.09 Other idiopathic peripheral autonomic neuropathy L97.322 Non-pressure chronic ulcer of left ankle with fat layer exposed L97.522 Non-pressure chronic ulcer of other part of left foot with fat layer exposed N18.9 Chronic kidney disease, unspecified I48.0 Paroxysmal atrial fibrillation Z79.01 Long term (current) use of anticoagulants Discharge From [facility] Services o Discharge from [facility] o Moisturize legs daily after removing compression garments. - EUCERIN is good lotion for protection at bedtime choose unscented o DO YOUR BEST  to sleep in the bed at night. DO NOT sleep in your recliner. Long hours of  sitting in a recliner leads to swelling of the legs and/or potential wounds on your backside. Bathing/ Shower/ Hygiene o Other: - rub healed wound gently, do not scrub may shower after protective dressing removed in two days Edema Control - Lymphedema / Segmental Compressive Device / Other o Patient to wear own compression stockings. Remove compression stockings every night before going to bed and put on every morning when getting up. - right leg every morning o Patient to wear own Velcro compression garment. Remove compression stockings every night before going to bed and put on every morning when getting up. - left leg-every morning Protective dressing applied and keep on for two days for protection o Elevate, Exercise Daily and Avoid Standing for Long Periods of Time. o Elevate legs to the level of the heart and pump ankles as often as possible o Elevate leg(s) parallel to the floor when sitting. o DO YOUR BEST to sleep in the bed at night. DO NOT sleep in your recliner. Long hours of sitting in a recliner leads to swelling of the legs and/or potential wounds on your backside. Wound Treatment Electronic Signature(s) Signed: 04/02/2021 2:04:00 PM By: Donnamarie Poag Signed: 04/02/2021 5:38:02 PM By: Worthy Keeler PA-C Entered By: Donnamarie Poag on 04/02/2021 13:13:15 Kristin [name], [name] ([id number]) -------------------------------------------------------------------------------- Problem List Details Patient Name: Kristin [name], Hilarie L. Date of Service: 04/02/2021 12:30 PM Medical Record Number: [id number] Patient Account Number: [id number] Date of Birth/Sex: 03-10-1930 (87 y.o. F) Treating RN: Donnamarie Poag Primary Care Provider: [name] Other Clinician: Referring Provider: [name] Treating Provider/Extender: [name] in Treatment: 12 Active Problems ICD-10 Encounter Code  Description Active Date MDM Diagnosis G90.09 Other idiopathic peripheral autonomic neuropathy 01/06/2021 No Yes L97.322 Non-pressure chronic ulcer of left ankle with fat layer exposed 01/06/2021 No Yes L97.522 Non-pressure chronic ulcer of other part of left foot with fat layer 01/06/2021 No Yes exposed N18.9 Chronic kidney disease, unspecified 01/06/2021 No Yes I48.0 Paroxysmal atrial fibrillation 01/06/2021 No Yes Z79.01 Long term (current) use of anticoagulants 01/06/2021 No Yes Inactive Problems Resolved Problems Electronic Signature(s) Signed: 04/02/2021 12:59:27 PM By: Worthy Keeler PA-C Entered By: Worthy Keeler on 04/02/2021 12:59:27 Kristin [name], [name] ([id number]) -------------------------------------------------------------------------------- Progress Note Details Patient Name: Kristin [name], [name] Date of Service: 04/02/2021 12:30 PM Medical Record Number: [id number] Patient Account Number: [id number] Date of Birth/Sex: January 27, 1930 (87 y.o. F) Treating RN: Donnamarie Poag Primary Care Provider: [name] Other Clinician: Referring Provider: [name] Treating Provider/Extender: [name] in Treatment: 12 Subjective Chief Complaint Information obtained from Patient Left ankle and left foot ulcers History of Present Illness (HPI) Left 0.81 Right 0.83 12/25/20 About a year but patient is not sure how long for sure 01/06/2021 upon evaluation today patient presents for initial inspection here in our clinic concerning issues she has been having in 2 areas one being on her left lateral malleolus. This is the most significant of the 2 regions based on what I am seeing today. The other is actually over her left dorsal 3rd toe. With that being said the patient tells me that currently she has been tolerating the dressing changes without complication. There does not appear to be any signs of obvious infection at this time but nonetheless she does not seem to be doing a  lot better. She is mainly just been putting a cream on the areas but she is not sure what cream this was when I questioned her about that today she states "it may be on my  med list" although I did not see that. The patient is really not a very good historian in general concerning how long this has been going on and what exactly is going on. The patient does have a history of neuropathy, chronic kidney disease, atrial fibrillation and is on long-term anticoagulant therapy as well as aspirin. This includes Eliquis. 5/24; patient has an open area on the left lateral malleolus as well as an area on the base of her third toe which is mycotic also on the left. We have been using Hydrofera Blue as of last week. Biopsy that was done showed chronic stasis dermatitis with areas of healing erosion and overlying inflammatory crust. Negative for malignancy and negative for fungal organisms. X-ray of the foot did not show anything abnormal except degenerative changes 6/1; left lateral malleolus wound had a lot of callus and dry skin on the surface. I remove this there is only 2 small open areas. The base of the left third toe at the base of the mycotic nail also required debridement we have been using silver alginate in both areas 01/27/2021 upon evaluation today patient appears to be doing decently well in regard to her wounds. In fact she appears to be healed based on what I am seeing at this point. I do not see any signs of active infection at this time. Readmission: 02/26/2021 upon evaluation today patient unfortunately has a reopening of her wound on her leg. She has on the left lateral ankle noted wound has been open for about a week. She tells me that she has not been wearing compression since we discharged her labs under the impression that she had compression stockings and would be wearing those. Nonetheless it does not appear to be the case. I do believe this may be very likely why this reopen. Fortunately  there does not appear to be any signs of active infection at this time. No fevers, chills, nausea, vomiting, or diarrhea. 03/05/2021 upon evaluation today patient appears to be doing well with regard to the wound on her ankle region. She has been tolerating the dressing changes without complication. Fortunately there is no evidence of active infection which is great news. 03/13/2021 upon evaluation today patient appears to be doing well with regard to her wound. She has been tolerating the dressing changes without complication and very pleased in that regard. There does not appear to be any signs of active infection which is great news and overall I think the patient is making excellent progress. 03/19/2021 upon evaluation today patient appears to be doing well with regard to her ankle ulcer. She has been tolerating the dressing changes without complication and overall I think she is actually making excellent progress. This is measuring significantly smaller today as compared to last time I saw her. 04/02/2021 upon evaluation today patient appears to be doing well with regard to her ankle ulcer. She has been tolerating the dressing changes without complication. Fortunately there is no signs of active infection at this time. No fevers, chills, nausea, vomiting, or diarrhea. With that being said the patient tells me that she is doing well and is having no pain which is also great news. Objective Kristin [name], [name] ([id number]) Constitutional Well-nourished and well-hydrated in no acute distress. Vitals Time Taken: 12:42 PM, Height: 67 in, Weight: 147 lbs, BMI: 23, Temperature: 97.9 F, Pulse: 74 bpm, Respiratory Rate: 16 breaths/min, Blood Pressure: 115/67 mmHg. Respiratory normal breathing without difficulty. Psychiatric this patient is able to make decisions and demonstrates  good insight into disease process. Alert and Oriented x 3. pleasant and cooperative. General Notes: Upon inspection patient's  wound bed actually showed signs of good granulation epithelization at this point. There does not appear to be any signs of active infection which is great news and overall very pleased with where things stand. In fact this appears to be completely healed which is awesome. Integumentary (Hair, Skin) Wound #1R status is Healed - Epithelialized. Original cause of wound was Gradually Appeared. The date acquired was: 09/23/2020. The wound has been in treatment 12 weeks. The wound is located on the Left Malleolus. The wound measures 0cm length x 0cm width x 0cm depth; 0cm^2 area and 0cm^3 volume. There is no tunneling or undermining noted. There is a none present amount of drainage noted. There is no granulation within the wound bed. There is no necrotic tissue within the wound bed. 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 - EUCERIN is good lotion for protection at bedtime choose unscented DO YOUR BEST to sleep in the bed at night. DO NOT sleep in your recliner. Long hours of sitting in a recliner leads to swelling of the legs and/or potential wounds on your backside. Bathing/ Shower/ Hygiene: Other: - rub healed wound gently, do not scrub may shower after protective dressing removed in two days Edema Control - Lymphedema / Segmental Compressive Device / Other: Patient to wear own compression stockings. Remove compression stockings every night before going to bed and put on every morning when getting up. - right leg every morning Patient to wear own Velcro compression  garment. Remove compression stockings every night before going to bed and put on every morning when getting up. - left leg-every morning Protective dressing applied and keep on for two days for protection Elevate, Exercise Daily and Avoid Standing for Long Periods of Time. Elevate legs to the level of the heart and pump ankles as often as possible Elevate leg(s) parallel to the floor when sitting. DO YOUR BEST to sleep in the bed at night. DO NOT sleep in your recliner. Long hours of sitting in a recliner leads to swelling of the legs and/or potential wounds on your backside. 1. I would recommend that we going continue with the wound care measures as before and the patient is in agreement with plan. This includes the use of the border foam dressing for protection I think that she is can be for short-term. 2. I am also going to recommend a Velcro compression wrap and the patient is going to be shown how to use that today hopefully that should be beneficial for her as well. We will see patient back for reevaluation in 1 week here in the clinic. If anything worsens or changes patient will contact our office for additional recommendations. Kristin [name], [name] ([id number]) Electronic Signature(s) Signed: 04/02/2021 1:18:48 PM By: Worthy Keeler PA-C Entered By: Worthy Keeler on 04/02/2021 13:18:48 Kristin [name], [name] ([id number]) -------------------------------------------------------------------------------- SuperBill Details Patient Name: Mccord, Knox L. Date of Service: 04/02/2021 Medical Record Number: [id number] Patient Account Number: [id number] Date of Birth/Sex: 01-Mar-1930 (87 y.o. F) Treating RN: Donnamarie Poag Primary Care Provider: [name] Other Clinician: Referring Provider:  Parks Ranger, Alexander Treating Provider/Extender: Jeri Cos Weeks in Treatment: 12 Diagnosis Coding ICD-10 Codes Code Description G90.09 Other idiopathic peripheral autonomic neuropathy L97.322  Non-pressure chronic ulcer of left ankle with fat layer exposed L97.522 Non-pressure chronic ulcer of other part of left foot with fat layer exposed N18.9 Chronic kidney disease, unspecified I48.0 Paroxysmal atrial fibrillation Z79.01 Long term (current) use of anticoagulants Facility Procedures CPT4 Code: [id number] Description: [phone] - WOUND CARE VISIT-LEV 2 EST PT Modifier: Quantity: 1 Physician Procedures CPT4 Code: [id number] Description: 99213 - WC PHYS LEVEL 3 - EST PT Modifier: Quantity: 1 CPT4 Code: Description: ICD-10 Diagnosis Description G90.09 Other idiopathic peripheral autonomic neuropathy L97.322 Non-pressure chronic ulcer of left ankle with fat layer exposed L97.522 Non-pressure chronic ulcer of other part of left foot with fat layer ex  N18.9 Chronic kidney disease, unspecified Modifier: posed Quantity: Electronic Signature(s) Signed: 04/02/2021 1:19:03 PM By: Worthy Keeler PA-C Entered By: Worthy Keeler on 04/02/2021 13:19:03

## 2021-04-02 NOTE — Progress Notes (Signed)
Subjective:    [name], female    DOB: [birth year], 87 y.o.   MRN: [id number]  Kristin [name] is a 87 y.o. female presenting on 04/02/2021 for Annual Exam and Urinary Frequency   HPI  Here for Annual Physical and Due for labs.  Dyspnea on Exertion (chronic) Chronic Respiratory Failure with Hypoxia on oxygen Centrilobular Emphysema Diastolic CHF Hypotension  Followed by Dr Raul Del [facility])   On Stiolto inhaler now. Not on Anoro. Doing well. She uses supplemental oxygen at home most of the time. Chronic respiratory failure - on home continuous oxygen 2-3 L. On since COVID 07/2020 pneumonia    On Lisinopril 10mg , did not take today  Weight down 4-6 lbs since last reference. Her appetite has improved. She is taking 1 can/serving of Ensure / Boost  Additional  Urinary frequency - several times per day, worse overnight Denies fever chills, dysuria.  HM UTD 2nd booster covid.    Depression screen [facility] 2/9 03/04/2021 01/01/2021 09/30/2020  Decreased Interest 0 0 0  Down, Depressed, Hopeless 0 0 0  PHQ - 2 Score 0 0 0  Altered sleeping - - -  Tired, decreased energy - - -  Change in appetite - - -  Feeling bad or failure about yourself  - - -  Trouble concentrating - - -  Moving slowly or fidgety/restless - - -  Suicidal thoughts - - -  PHQ-9 Score - - -  Difficult doing work/chores - - -  Some recent data might be hidden    Past Medical History:  Diagnosis Date   Acute postoperative pain 02/09/2018   Arrhythmia, sinus node 04/03/2014   Arthritis    Arthritis    Atrial fibrillation (HCC)    Back pain    lower back chronic   Bradycardia    Breast cancer ([place]) 2004   right breast cancer   Cancer ([place]) 2004   rt breast cancer-post lumpectomy- chemo/rad   CHF (congestive heart failure) (HCC)    Chronic back pain    CKD (chronic kidney disease)    COPD (chronic obstructive pulmonary disease) ([place])    wears O2 at 2L via [place] at night   Cystocele     Decubitus ulcers    Dehydration    Gout    Hematuria    Hepatitis C    Hyperlipidemia    Hypertension    Hypomagnesemia 06/25/2015   OAB (overactive bladder)    Overactive bladder    Restless leg    Shoulder pain, bilateral    Urinary frequency    UTI (lower urinary tract infection)    Vaginal atrophy    Past Surgical History:  Procedure Laterality Date   ABDOMINAL HYSTERECTOMY     BACK SURGERY     BREAST LUMPECTOMY Right 2004   BREAST SURGERY     rt lumpectomy   CARPAL TUNNEL RELEASE     CATARACT EXTRACTION W/PHACO  10/19/2011   Procedure: CATARACT EXTRACTION PHACO AND INTRAOCULAR LENS PLACEMENT ([place]);  Surgeon: Elta Guadeloupe T. Gershon Crane, MD;  Location: [facility];  Service: Ophthalmology;  Laterality: Right;  CDE:10.81   CATARACT EXTRACTION W/PHACO  11/02/2011   Procedure: CATARACT EXTRACTION PHACO AND INTRAOCULAR LENS PLACEMENT ([facility]);  Surgeon: Elta Guadeloupe T. Gershon Crane, MD;  Location: [facility];  Service: Ophthalmology;  Laterality: Left;  CDE 8.60   CHOLECYSTECTOMY     3/18   COLON SURGERY     INSERT / REPLACE / REMOVE PACEMAKER  JOINT REPLACEMENT     bilateral TKA   LOWER EXTREMITY ANGIOGRAPHY Left 10/14/2020   Procedure: LOWER EXTREMITY ANGIOGRAPHY;  Surgeon: Katha Cabal, MD;  Location: [facility];  Service: Cardiovascular;  Laterality: Left;   PACEMAKER INSERTION N/A 12/24/2015   Procedure: INSERTION PACEMAKER;  Surgeon: Isaias Cowman, MD;  Location: [facility];  Service: Cardiovascular;  Laterality: N/A;   ROTATOR CUFF REPAIR     bilateral   Social History   Socioeconomic History   Marital status: Single    Spouse name: Not on file   Number of children: 2   Years of education: 44   Highest education level: 12th grade  Occupational History   Occupation: retired  Tobacco Use   Smoking status: Former    Packs/day: 1.25    Years: 40.00    Pack years: 50.00    Types: Cigarettes    Quit date: 12/16/1998    Years since quitting: 22.3   Smokeless tobacco: Former   Scientific laboratory technician Use: Never used  Substance and Sexual Activity   Alcohol use: No    Alcohol/week: 0.0 standard drinks   Drug use: Yes    Types: Oxycodone   Sexual activity: Not Currently    Birth control/protection: Surgical  Other Topics Concern   Not on file  Social History Narrative   Not on file   Social Determinants of Health   Financial Resource Strain: Low Risk    Difficulty of Paying Living Expenses: Not hard at all  Food Insecurity: No Food Insecurity   Worried About Charity fundraiser in the Last Year: Never true   [place] in the Last Year: Never true  Transportation Needs: No Transportation Needs   Lack of Transportation (Medical): No   Lack of Transportation (Non-Medical): No  Physical Activity: Inactive   Days of Exercise per Week: 0 days   Minutes of Exercise per Session: 0 min  Stress: No Stress Concern Present   Feeling of Stress : Not at all  Social Connections: Not on file  Intimate Partner Violence: Not on file   Family History  Problem Relation Age of Onset   Kidney disease Brother    Heart disease Father    Heart disease Mother    Stroke Mother    Cancer Sister    Breast cancer Sister 70   Breast cancer Paternal Aunt    Anesthesia problems Neg Hx    Hypotension Neg Hx    Malignant hyperthermia Neg Hx    Pseudochol deficiency Neg Hx    Current Outpatient Medications on File Prior to Visit  Medication Sig   acetaminophen (TYLENOL) 500 MG tablet Take 500 mg by mouth in the morning and at bedtime.   apixaban (ELIQUIS) 5 MG TABS tablet Take 1 tablet (5 mg total) by mouth 2 (two) times daily.   aspirin EC 81 MG tablet Take 1 tablet (81 mg total) by mouth daily. Swallow whole.   atorvastatin (LIPITOR) 20 MG tablet TAKE 1 TABLET BY MOUTH ONCE DAILY FOR CHOLESTEROL   bisacodyl (DULCOLAX) 5 MG EC tablet Take 2 tablets (10 mg total) by mouth daily as needed for moderate constipation.   Cholecalciferol (VITAMIN D) 2000 units tablet Take  2,000 Units by mouth daily.    furosemide (LASIX) 40 MG tablet TAKE (1) TABLET BY MOUTH ONCE DAILY AS NEEDED. (Patient taking differently: Take 20 mg by mouth daily.)   levocetirizine (XYZAL) 5 MG tablet TAKE (1/2) TABLET BY  MOUTH EVERY OTHER DAY.   lidocaine (XYLOCAINE) 5 % ointment Apply 1 application topically daily as needed.   lisinopril (ZESTRIL) 10 MG tablet Take by mouth.   Melatonin 10 MG TABS Take 10 mg by mouth at bedtime.   oxyCODONE 10 MG TABS Take 1 tablet (10 mg total) by mouth in the morning and at bedtime. Must last 30 days.   [START ON 04/03/2021] oxyCODONE 10 MG TABS Take 1 tablet (10 mg total) by mouth in the morning and at bedtime. Must last 30 days.   [START ON 05/03/2021] oxyCODONE 10 MG TABS Take 1 tablet (10 mg total) by mouth in the morning and at bedtime. Must last 30 days.   OXYGEN Inhale 2 L into the lungs at bedtime.    rOPINIRole (REQUIP) 2 MG tablet TAKE 2 TABLETS BY MOUTH AT BEDTIME   solifenacin (VESICARE) 5 MG tablet Take 1 tablet by mouth daily.   Tiotropium Bromide-Olodaterol (STIOLTO RESPIMAT) 2.5-2.5 MCG/ACT AERS Inhale into the lungs.   umeclidinium-vilanterol (ANORO ELLIPTA) 62.5-25 MCG/INH AEPB Inhale into the lungs.   albuterol (PROVENTIL) (2.5 MG/3ML) 0.083% nebulizer solution Take 3 mLs (2.5 mg total) by nebulization every 8 (eight) hours as needed for wheezing or shortness of breath.   Wheat Dextrin (BENEFIBER) POWD Take 6 g by mouth 3 (three) times daily before meals. (2 tsp = 6 g) (Patient not taking: Reported on 01/27/2021)   No current facility-administered medications on file prior to visit.    Review of Systems  Constitutional:  Negative for activity change, appetite change, chills, diaphoresis, fatigue and fever.  HENT:  Negative for congestion and hearing loss.   Eyes:  Negative for visual disturbance.  Respiratory:  Negative for cough, chest tightness, shortness of breath and wheezing.   Cardiovascular:  Negative for chest pain, palpitations  and leg swelling.  Gastrointestinal:  Negative for abdominal pain, constipation, diarrhea, nausea and vomiting.  Genitourinary:  Negative for dysuria, frequency and hematuria.  Musculoskeletal:  Negative for arthralgias and neck pain.  Skin:  Negative for rash.  Neurological:  Negative for dizziness, weakness, light-headedness, numbness and headaches.  Hematological:  Negative for adenopathy.  Psychiatric/Behavioral:  Negative for behavioral problems, dysphoric mood and sleep disturbance.   Per HPI unless specifically indicated above      Objective:    BP 116/63   Pulse 63   Ht 5\' 7"  (1.702 m)   Wt 144 lb 12.8 oz (65.7 kg)   SpO2 100%   BMI 22.68 kg/m   Wt Readings from Last 3 Encounters:  04/02/21 144 lb 12.8 oz (65.7 kg)  03/24/21 150 lb (68 kg)  03/06/21 152 lb (68.9 kg)    Physical Exam Vitals and nursing note reviewed.  Constitutional:      General: She is not in acute distress.    Appearance: She is well-developed. She is not diaphoretic.     Comments: Elderly 59 female, currently well comfortable, cooperative  HENT:     Head: Normocephalic and atraumatic.  Eyes:     General:        Right eye: No discharge.        Left eye: No discharge.     Conjunctiva/sclera: Conjunctivae normal.     Pupils: Pupils are equal, round, and reactive to light.  Neck:     Thyroid: No thyromegaly.  Cardiovascular:     Rate and Rhythm: Normal rate and regular rhythm.     Pulses: Normal pulses.     Heart sounds: Normal heart sounds.  No murmur heard. Pulmonary:     Effort: Pulmonary effort is normal. No respiratory distress.     Breath sounds: Wheezing (scattered very mild wheeze, overall improved) present. No rales.  Abdominal:     General: Bowel sounds are normal. There is no distension.     Palpations: Abdomen is soft. There is no mass.     Tenderness: There is no abdominal tenderness.  Musculoskeletal:        General: No tenderness. Normal range of motion.     Cervical back:  Normal range of motion and neck supple.     Comments: Upper / Lower Extremities: - Normal muscle tone, strength bilateral upper extremities 5/5, lower extremities 5/5  Lymphadenopathy:     Cervical: No cervical adenopathy.  Skin:    General: Skin is warm and dry.     Findings: Lesion (left lower extremity skin sore, has wrap and dressing currently) present. No erythema or rash.  Neurological:     Mental Status: She is alert and oriented to person, place, and time.     Comments: Distal sensation intact to light touch all extremities  Psychiatric:        Mood and Affect: Mood normal.        Behavior: Behavior normal.        Thought Content: Thought content normal.     Comments: Well groomed, good eye contact, normal speech and thoughts   Results for orders placed or performed in visit on 03/04/21  ToxASSURE Select 13 (MW), Urine  Result Value Ref Range   Summary Note       Assessment & Plan:   Problem List Items Addressed This Visit     DDD (degenerative disc disease), lumbosacral (Chronic)   Relevant Medications   gabapentin (NEURONTIN) 100 MG capsule   Chronic pain syndrome (Chronic)   Relevant Medications   gabapentin (NEURONTIN) 100 MG capsule   Chronic diastolic CHF (congestive heart failure) ([place])   Relevant Orders   Lipid panel   Centrilobular emphysema ([place])   Benign hypertension with CKD (chronic kidney disease) stage III ([place])   Relevant Orders   Lipid panel   CBC with Differential/Platelet   BASIC METABOLIC PANEL WITH GFR   Atrial fibrillation and flutter ([place])   Relevant Orders   CBC with Differential/Platelet   BASIC METABOLIC PANEL WITH GFR   Other Visit Diagnoses     Annual physical exam    -  Primary   Relevant Orders   Lipid panel   CBC with Differential/Platelet   BASIC METABOLIC PANEL WITH GFR   Urinary frequency       Relevant Orders   POCT Urinalysis Dipstick   Chronic respiratory failure with hypoxia ([place])           Updated Health  Maintenance information UTD COVID Booster Labs today Encouraged improvement to lifestyle with diet and exercise Goal maintain weight  COPD Chronic respiratory failure on O2 Followed by Pulm  Urinary frequency- check urine POC dipstick, shows sign of possible infection will cover with Augmentin antibiotic Likely OAB component on Vesicare Check urine culture  Chronic Pain OA DDD Followed by Pain Management On reduced dose now  Re order Gabapentin 100x 3 QHS  HTN CKDIII Continue current therapy  HLD On Statin  Orders Placed This Encounter  Procedures   Urine Culture   Lipid panel    Order Specific Question:   Has the patient fasted?    Answer:   Yes   CBC with Differential/Platelet  BASIC METABOLIC PANEL WITH GFR   POCT Urinalysis Dipstick     Meds ordered this encounter  Medications   gabapentin (NEURONTIN) 100 MG capsule    Sig: Take 3 capsules (300 mg total) by mouth at bedtime.    Dispense:  270 capsule    Refill:  1      Follow up plan: Return in about 3 months (around 07/03/2021) for 3 month follow-up COPD / Flu Shot.  [name], [facility] 04/02/2021, 8:55 AM

## 2021-04-02 NOTE — Patient Instructions (Addendum)
Thank you for coming to the office today.  Muscle Cramps - OTC natural option is Hyland's Leg Cramps (Dissolving tablet) take as needed for muscle cramps  Urine test today stay tuned for results.  Labs today  Restart Gabapentin 100mg  - take 1 to 3 at night for muscles and nerve pain   Please schedule a Follow-up Appointment to: Return in about 3 months (around 07/03/2021) for 3 month follow-up COPD / Flu Shot.  If you have any other questions or concerns, please feel free to call the office or send a message through [facility]. You may also schedule an earlier appointment if necessary.  Additionally, you may be receiving a survey about your experience at our office within a few days to 1 week by e-mail or mail. We value your feedback.  [name], DO [place]

## 2021-04-03 LAB — URINE CULTURE
MICRO NUMBER:: 12232329
SPECIMEN QUALITY:: ADEQUATE

## 2021-04-05 NOTE — Progress Notes (Signed)
Patient: Kristin [name]  Service Category: E/M  Provider: Gaspar Cola, MD  DOB: 07-Jul-1930  DOS: 04/07/2021  Location: Office  MRN: [id number]  Setting: Ambulatory outpatient  Referring Provider: [name] *  Type: Established Patient  Specialty: Interventional Pain Management  PCP: Olin Hauser, DO  Location: Remote location  Delivery: TeleHealth     Virtual Encounter - Pain Management PROVIDER NOTE: Information contained herein reflects review and annotations entered in association with encounter. Interpretation of such information and data should be left to medically-trained personnel. Information provided to patient can be located elsewhere in the medical record under "Patient Instructions". Document created using STT-dictation technology, any transcriptional errors that may result from process are unintentional.    Contact & Pharmacy Preferred: [phone] Home: [phone] (home) Mobile: [phone] (mobile) E-mail: [name], Kristin [name] [address] [place] [address] Phone: [phone] Fax: [phone]  [id number] [place] [address]: [phone]: [phone]  Pre-screening  Kristin [name] offered "in-person" vs "virtual" encounter. She indicated preferring virtual for this encounter.   Reason COVID-19*  Social distancing based on CDC and AMA recommendations.   I contacted Kristin [name] 04/07/2021 via telephone.      I clearly identified myself as FGaspar Cola MD. I verified that I was speaking with the correct person using two identifiers (Name: Kristin [name] and date of birth: [birth year].  Consent I sought verbal advanced consent from Kristin Backersfor virtual visit interactions. I informed Kristin [name] of possible security and privacy concerns, risks, and limitations associated with providing  "not-in-person" medical evaluation and management services. I also informed Kristin [name] of the availability of "in-person" appointments. Finally, I informed her that there would be a charge for the virtual visit and that she could be  personally, fully or partially, financially responsible for it. Ms. GRawsonexpressed understanding and agreed to proceed.   Historic Elements   Kristin [name] a 87y.o. year old, female patient evaluated today after our last contact on 03/24/2021. Kristin [name] has a past medical history of Acute postoperative pain (02/09/2018), Arrhythmia, sinus node (04/03/2014), Arthritis, Arthritis, Atrial fibrillation ([place], Back pain, Bradycardia, Breast cancer ([place] ([place]), Cancer [place] ([place]), CHF (congestive heart failure) ([place], Chronic back pain, CKD (chronic kidney disease), COPD (chronic obstructive pulmonary disease) ([place], Cystocele, Decubitus ulcers, Dehydration, Gout, Hematuria, Hepatitis C, Hyperlipidemia, Hypertension, Hypomagnesemia (06/25/2015), OAB (overactive bladder), Overactive bladder, Restless leg, Shoulder pain, bilateral, Urinary frequency, UTI (lower urinary tract infection), and Vaginal atrophy. She also  has a past surgical history that includes Abdominal hysterectomy; Back surgery; Rotator cuff repair; Breast surgery; Cataract extraction w/PHACO (10/19/2011); Cataract extraction w/PHACO (11/02/2011); Carpal tunnel release; Joint replacement; Colon surgery; Pacemaker insertion (N/A, 12/24/2015); Cholecystectomy; Breast lumpectomy (Right, 2004); Insert / replace / remove pacemaker; and Lower Extremity Angiography (Left, 10/14/2020). Kristin [name] a current medication list which includes the following prescription(s): acetaminophen, albuterol, amoxicillin-clavulanate, apixaban, aspirin ec, atorvastatin, bisacodyl, vitamin d, furosemide, gabapentin, levocetirizine, lidocaine, lisinopril, melatonin, oxycodone hcl, oxycodone hcl, [START ON 05/03/2021] oxycodone hcl,  oxygen-helium, ropinirole, solifenacin, stiolto respimat, anoro ellipta, and benefiber. She  reports that she quit smoking about 22 years ago. Her smoking use included cigarettes. She has a 50.00 pack-year smoking history. She has quit using smokeless tobacco. She reports current drug use. Drug: Oxycodone. She reports that she does not drink alcohol.  Kristin [name] is allergic to flexeril [cyclobenzaprine hcl] and vancomycin.   HPI  Today, she is being contacted for a post-procedure assessment.  Today I called the patient to follow-up with her regarding the bilateral lumbar facet block.  I was able to talk to her granddaughter and they were actually taking her blood pressure today since she appears to be a little bit hypotensive, weak, and sleepy.  She was apparently recently diagnosed with a UTI and she was provided with some antibiotics.  She does take medication for her blood pressure, but she has not taken it yet and I recommended that they do not give her to her until her blood pressure comes up.  Apparently she did take her Lasix this morning and she claims to be drinking enough water and having been going to the bathroom appropriately to void.  She has not taken her pain medication yet and I have also recommended that they hold on that until her blood pressure comes up since this could further aggravate her levels.  According to her granddaughter she has a systolic of about 88.  Since she was recently diagnosed with a UTI, there is the possibility that she could be getting a little septic and therefore I have recommended that they take her to the ED to evaluate her and perhaps give her some fluids to bring the blood pressure.  It may also be a good idea to do a CBC with differential and a urinalysis.  In any case, this seems to take precedence over her low back pain, which she indicates that still the same as before.  She also indicates having some pain in the lower extremities and weakness.  I will see her  on follow-up for her medication management, in which time I will reassess her, hopefully after all this improves.  Post-Procedure Evaluation  Procedure (03/24/2021):  Type: Lumbar Facet, Medial Branch Block(s)          Primary Purpose: Diagnostic Region: Posterolateral Lumbosacral Spine Level: L2, L3, L4, L5, & S1 Medial Branch Level(s). Injecting these levels blocks the L3-4, L4-5, and L5-S1 lumbar facet joints. Laterality: Bilateral  Pre-procedure pain level: 5/10 Post-procedure: 0/10 (100% relief)  Anxiolysis: none.  Effectiveness during initial hour after procedure (Ultra-Short Term Relief):  100%.  Local anesthetic used: Long-acting (4-6 hours) Effectiveness: Defined as any analgesic benefit obtained secondary to the administration of local anesthetics. This carries significant diagnostic value as to the etiological location, or anatomical origin, of the pain. Duration of benefit is expected to coincide with the duration of the local anesthetic used.  Effectiveness during initial 4-6 hours after procedure (Short-Term Relief):  100%.  Long-term benefit: Defined as any relief past the pharmacologic duration of the local anesthetics.  Effectiveness past the initial 6 hours after procedure (Long-Term Relief):   <50%.  Benefits, current: Defined as benefit present at the time of this evaluation.   Analgesia:  Back to baseline Function: Back to baseline ROM: Back to baseline  Pharmacotherapy Assessment   Analgesic: Oxycodone IR 10 mg, 1 tab PO BID (20 mg/day of oxycodone) MME/day: 30 mg/day.   Monitoring: [place] PMP: PDMP reviewed during this encounter.       Pharmacotherapy: No side-effects or adverse reactions reported. Compliance: No problems identified. Effectiveness: Clinically acceptable. Plan: Refer to "POC". UDS:  Summary  Date Value Ref Range Status  03/04/2021 Note  Final    Comment:    ==================================================================== [employer]  13 (MW) ==================================================================== Test  Result       Flag       Units  Drug Present and Declared for Prescription Verification   Oxymorphone                    270          EXPECTED   ng/mg creat   Noroxycodone                   317          EXPECTED   ng/mg creat    Oxymorphone and noroxycodone are expected metabolites of oxycodone.    Sources of oxycodone are scheduled prescription medications.    Oxymorphone is also available as a scheduled prescription medication.  Drug Absent but Declared for Prescription Verification   Oxycodone                      Not Detected UNEXPECTED ng/mg creat    Oxycodone is almost always present in patients taking this drug    consistently.  Absence of oxycodone could be due to lapse of time    since the last dose or unusual pharmacokinetics (rapid metabolism).  ==================================================================== Test                      Result    Flag   Units      Ref Range   Creatinine              30               mg/dL      >=20 ==================================================================== Declared Medications:  The flagging and interpretation on this report are based on the  following declared medications.  Unexpected results may arise from  inaccuracies in the declared medications.   **Note: The testing scope of this panel includes these medications:   Oxycodone   **Note: The testing scope of this panel does not include the  following reported medications:   Acetaminophen  Albuterol  Apixaban (Eliquis)  Aspirin  Atorvastatin  Bisacodyl  Dextromethorphan (Mucinex DM)  Furosemide  Guaifenesin (Mucinex DM)  Helium  Levocetirizine (Xyzal)  Lisinopril  Melatonin  Oxygen  Ropinirole  Solifenacin (Vesicare)  Supplement (Fiber)  Tiotropium  Topical Lidocaine  Umeclidinium (Anoro)  Vilanterol  (Anoro) ==================================================================== For clinical consultation, please call [phone]. ====================================================================      Laboratory Chemistry Profile   Renal Lab Results  Component Value Date   BUN 56 (H) 04/02/2021   CREATININE 1.40 (H) 04/02/2021   BCR 40 (H) 04/02/2021   GFRAA 52 (L) 10/02/2020   GFRNONAA 35 (L) 11/07/2020    Hepatic Lab Results  Component Value Date   AST 21 11/07/2020   ALT 16 11/07/2020   ALBUMIN 3.7 11/07/2020   ALKPHOS 55 11/07/2020    Electrolytes Lab Results  Component Value Date   NA 137 04/02/2021   K 5.5 (H) 04/02/2021   CL 107 04/02/2021   CALCIUM 10.2 04/02/2021   MG 2.0 11/07/2020   PHOS 3.0 08/05/2020    Bone Lab Results  Component Value Date   VD25OH 31 04/01/2020    Inflammation (CRP: Acute Phase) (ESR: Chronic Phase) Lab Results  Component Value Date   CRP <0.5 09/24/2015   ESRSEDRATE 12 09/24/2015   LATICACIDVEN 1.8 08/02/2020         Note: Above Lab results reviewed.  Imaging  DG PAIN CLINIC C-ARM 1-60 MIN NO REPORT Fluoro was  used, but no Radiologist interpretation will be provided.  Please refer to "NOTES" tab for provider progress note.  Assessment  The primary encounter diagnosis was Lumbar facet syndrome (Bilateral) (L>R). 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  Plan of Care  Problem-specific:  No problem-specific Assessment & Plan notes found for this encounter.  Kristin [name] has a current medication list which includes the following long-term medication(s): albuterol, apixaban, atorvastatin, bisacodyl, furosemide, gabapentin, levocetirizine, oxycodone hcl, oxycodone hcl, [START ON 05/03/2021] oxycodone hcl, ropinirole, and benefiber.  Pharmacotherapy (Medications Ordered): No orders of the defined types were placed in this encounter.  Orders:  No orders of the defined types were placed in this encounter.  Follow-up plan:   No follow-ups on file.     Interventional Therapies  Risk  Complexity Considerations:   NOTE: ELIQUIS ANTICOAGULATION (Stop: x3 days prior to procedure  Restart 6 hours after procedures)   Planned  Pending:   Pending further evaluation   Under consideration:   Diagnostic right SI joint block  Possible bilateral SI joint RFA  Diagnostic bilateral femoral nerve + obturator NB  Possible bilateral femoral nerve + obturator nerve RFA  Diagnostic right IA shoulder joint injection     Completed:   Diagnostic/therapeutic right L2 TFESI x1 (01/31/2020)  Diagnostic/therapeutic left L4 TFESI x1 (01/31/2020) Diagnostic left SI joint block x2 (09/27/2019) Palliative left CESI x2 (10/13/2017) Palliative left trochanteric bursa injection x1 (03/27/2019)  Palliative right IA hip inj. x1  (100% on the right) (02/08/2019) Palliative left IA hip inj. x2 (02/08/2019) Palliative right lumbar facet MBB x2 (01/10/2020) Palliative left lumbar facet MBB x4 (01/10/2020) Palliative right lumbar facet RFA x1 (09/19/18) Palliative left lumbar facet RFA x3 (08/01/18) Diagnostic/therapeutic right suprascapular NB x3 (05/10/2017)  Palliative bilateral suprascapular nerve RFA x1 (07/13/18)   Therapeutic  Palliative (PRN) options:   Diagnostic left SI joint block #2  Palliative left CESI #3   Palliative left trochanteric bursa injection #2  Palliative right IA hip inj. #2  (100% on the right) Palliative left IA hip inj. #3   Palliative right lumbar facet block #3  Palliative left lumbar facet block #5  Palliative right lumbar facet RFA #2  Palliative left lumbar facet RFA #4  Palliative bilateral suprascapular nerve block   Palliative bilateral suprascapular nerve RFA #2       Recent Visits Date Type Provider Dept  03/24/21 Procedure visit Milinda Pointer, MD [facility]  03/04/21 Office Visit Milinda Pointer, MD [facility]  01/28/21 Telemedicine Milinda Pointer, MD [facility]  Showing recent visits within past 90 days and meeting all other requirements Today's Visits Date Type Provider Dept  04/07/21 Telemedicine Milinda Pointer, MD [facility]  Showing today's visits and meeting all other requirements Future Appointments Date Type Provider Dept  05/27/21 Appointment Milinda Pointer, MD [facility]  Showing future appointments within next 90 days and meeting all other requirements I discussed the assessment and treatment plan with the patient. The patient was  provided an opportunity to ask questions and all were answered. The patient agreed with the plan and demonstrated an understanding of the instructions.  Patient advised to call back or seek an in-person evaluation if the symptoms or condition worsens.  Duration of encounter: 18 minutes.  Note by: Gaspar Cola, MD Date: 04/07/2021; Time: 5:18 PM

## 2021-04-07 ENCOUNTER — Other Ambulatory Visit: Payer: Self-pay

## 2021-04-07 ENCOUNTER — Observation Stay
Admission: EM | Admit: 2021-04-07 | Discharge: 2021-04-09 | Disposition: A | Discharge status: Home / Self Care | Payer: Medicare HMO | Attending: Internal Medicine | Admitting: Internal Medicine

## 2021-04-07 ENCOUNTER — Ambulatory Visit: Payer: Medicare HMO | Attending: Pain Medicine | Admitting: Pain Medicine

## 2021-04-07 ENCOUNTER — Emergency Department: Payer: Medicare HMO

## 2021-04-07 ENCOUNTER — Encounter: Payer: Self-pay | Admitting: *Deleted

## 2021-04-07 DIAGNOSIS — I440 Atrioventricular block, first degree: Secondary | ICD-10-CM | POA: Diagnosis not present

## 2021-04-07 DIAGNOSIS — Z8616 Personal history of COVID-19: Secondary | ICD-10-CM | POA: Insufficient documentation

## 2021-04-07 DIAGNOSIS — Z20822 Contact with and (suspected) exposure to covid-19: Secondary | ICD-10-CM | POA: Insufficient documentation

## 2021-04-07 DIAGNOSIS — N1831 Chronic kidney disease, stage 3a: Secondary | ICD-10-CM

## 2021-04-07 DIAGNOSIS — M47817 Spondylosis without myelopathy or radiculopathy, lumbosacral region: Secondary | ICD-10-CM

## 2021-04-07 DIAGNOSIS — M545 Low back pain, unspecified: Secondary | ICD-10-CM

## 2021-04-07 DIAGNOSIS — Z950 Presence of cardiac pacemaker: Secondary | ICD-10-CM | POA: Diagnosis not present

## 2021-04-07 DIAGNOSIS — I5032 Chronic diastolic (congestive) heart failure: Secondary | ICD-10-CM | POA: Diagnosis not present

## 2021-04-07 DIAGNOSIS — I959 Hypotension, unspecified: Principal | ICD-10-CM | POA: Insufficient documentation

## 2021-04-07 DIAGNOSIS — R9431 Abnormal electrocardiogram [ECG] [EKG]: Secondary | ICD-10-CM | POA: Insufficient documentation

## 2021-04-07 DIAGNOSIS — J449 Chronic obstructive pulmonary disease, unspecified: Secondary | ICD-10-CM | POA: Insufficient documentation

## 2021-04-07 DIAGNOSIS — N183 Chronic kidney disease, stage 3 unspecified: Secondary | ICD-10-CM | POA: Diagnosis not present

## 2021-04-07 DIAGNOSIS — Z87891 Personal history of nicotine dependence: Secondary | ICD-10-CM | POA: Insufficient documentation

## 2021-04-07 DIAGNOSIS — Z96653 Presence of artificial knee joint, bilateral: Secondary | ICD-10-CM | POA: Diagnosis not present

## 2021-04-07 DIAGNOSIS — G894 Chronic pain syndrome: Secondary | ICD-10-CM | POA: Diagnosis not present

## 2021-04-07 DIAGNOSIS — M5441 Lumbago with sciatica, right side: Secondary | ICD-10-CM

## 2021-04-07 DIAGNOSIS — Z7982 Long term (current) use of aspirin: Secondary | ICD-10-CM | POA: Diagnosis not present

## 2021-04-07 DIAGNOSIS — N179 Acute kidney failure, unspecified: Secondary | ICD-10-CM | POA: Insufficient documentation

## 2021-04-07 DIAGNOSIS — Z853 Personal history of malignant neoplasm of breast: Secondary | ICD-10-CM | POA: Diagnosis not present

## 2021-04-07 DIAGNOSIS — I480 Paroxysmal atrial fibrillation: Secondary | ICD-10-CM | POA: Insufficient documentation

## 2021-04-07 DIAGNOSIS — Z7901 Long term (current) use of anticoagulants: Secondary | ICD-10-CM | POA: Diagnosis not present

## 2021-04-07 DIAGNOSIS — M47816 Spondylosis without myelopathy or radiculopathy, lumbar region: Secondary | ICD-10-CM

## 2021-04-07 DIAGNOSIS — M961 Postlaminectomy syndrome, not elsewhere classified: Secondary | ICD-10-CM | POA: Diagnosis not present

## 2021-04-07 DIAGNOSIS — M5442 Lumbago with sciatica, left side: Secondary | ICD-10-CM | POA: Diagnosis not present

## 2021-04-07 DIAGNOSIS — M4310 Spondylolisthesis, site unspecified: Secondary | ICD-10-CM

## 2021-04-07 DIAGNOSIS — R778 Other specified abnormalities of plasma proteins: Secondary | ICD-10-CM

## 2021-04-07 DIAGNOSIS — M549 Dorsalgia, unspecified: Secondary | ICD-10-CM

## 2021-04-07 DIAGNOSIS — N390 Urinary tract infection, site not specified: Secondary | ICD-10-CM

## 2021-04-07 DIAGNOSIS — M5137 Other intervertebral disc degeneration, lumbosacral region: Secondary | ICD-10-CM | POA: Diagnosis not present

## 2021-04-07 DIAGNOSIS — G8929 Other chronic pain: Secondary | ICD-10-CM

## 2021-04-07 DIAGNOSIS — R531 Weakness: Secondary | ICD-10-CM

## 2021-04-07 DIAGNOSIS — R031 Nonspecific low blood-pressure reading: Secondary | ICD-10-CM

## 2021-04-07 DIAGNOSIS — Z79899 Other long term (current) drug therapy: Secondary | ICD-10-CM | POA: Insufficient documentation

## 2021-04-07 DIAGNOSIS — Z8679 Personal history of other diseases of the circulatory system: Secondary | ICD-10-CM

## 2021-04-07 DIAGNOSIS — I130 Hypertensive heart and chronic kidney disease with heart failure and stage 1 through stage 4 chronic kidney disease, or unspecified chronic kidney disease: Secondary | ICD-10-CM | POA: Insufficient documentation

## 2021-04-07 DIAGNOSIS — N189 Chronic kidney disease, unspecified: Secondary | ICD-10-CM

## 2021-04-07 DIAGNOSIS — M79605 Pain in left leg: Secondary | ICD-10-CM

## 2021-04-07 DIAGNOSIS — I214 Non-ST elevation (NSTEMI) myocardial infarction: Secondary | ICD-10-CM

## 2021-04-07 DIAGNOSIS — R52 Pain, unspecified: Secondary | ICD-10-CM | POA: Diagnosis not present

## 2021-04-07 DIAGNOSIS — I739 Peripheral vascular disease, unspecified: Secondary | ICD-10-CM

## 2021-04-07 DIAGNOSIS — M79604 Pain in right leg: Secondary | ICD-10-CM

## 2021-04-07 DIAGNOSIS — I4430 Unspecified atrioventricular block: Secondary | ICD-10-CM | POA: Diagnosis not present

## 2021-04-07 LAB — BASIC METABOLIC PANEL
Anion gap: 10 (ref 5–15)
BUN: 71 mg/dL — ABNORMAL HIGH (ref 8–23)
CO2: 18 mmol/L — ABNORMAL LOW (ref 22–32)
Calcium: 9.7 mg/dL (ref 8.9–10.3)
Chloride: 106 mmol/L (ref 98–111)
Creatinine, Ser: 2.33 mg/dL — ABNORMAL HIGH (ref 0.44–1.00)
GFR, Estimated: 20 mL/min — ABNORMAL LOW (ref >60)
Glucose, Bld: 90 mg/dL (ref 70–99)
Potassium: 5.3 mmol/L — ABNORMAL HIGH (ref 3.5–5.1)
Sodium: 134 mmol/L — ABNORMAL LOW (ref 135–145)

## 2021-04-07 LAB — TROPONIN I (HIGH SENSITIVITY)
Troponin I (High Sensitivity): 28 ng/L — ABNORMAL HIGH (ref <18)
Troponin I (High Sensitivity): 26 ng/L — ABNORMAL HIGH (ref <18)

## 2021-04-07 LAB — CBC
HCT: 39.3 % (ref 36.0–46.0)
Hemoglobin: 12.6 g/dL (ref 12.0–15.0)
MCH: 31.3 pg (ref 26.0–34.0)
MCHC: 32.1 g/dL (ref 30.0–36.0)
MCV: 97.5 fL (ref 80.0–100.0)
Platelets: 152 10*3/uL (ref 150–400)
RBC: 4.03 MIL/uL (ref 3.87–5.11)
RDW: 14.1 % (ref 11.5–15.5)
WBC: 9.6 10*3/uL (ref 4.0–10.5)
nRBC: 0 % (ref 0.0–0.2)

## 2021-04-07 LAB — RESP PANEL BY RT-PCR (FLU A&B, COVID) ARPGX2
Influenza A by PCR: NEGATIVE
Influenza B by PCR: NEGATIVE
SARS Coronavirus 2 by RT PCR: NEGATIVE

## 2021-04-07 MED ORDER — SODIUM CHLORIDE 0.9 % IV SOLN: Freq: Once | INTRAVENOUS | Status: AC

## 2021-04-07 NOTE — ED Notes (Signed)
Pt still on the commode trying to have a bowel movement. Instructed to use call bell when done, verbalized understanding.

## 2021-04-07 NOTE — ED Notes (Signed)
Assisted pt to the restroom 

## 2021-04-07 NOTE — ED Triage Notes (Signed)
EMS brings pt in from home for c/o generalized weakness; rx augementin Saturday for UTI

## 2021-04-07 NOTE — ED Provider Notes (Signed)
[facility] Emergency Department Provider Note  ____________________________________________   Event Date/Time   First MD Initiated Contact with Patient 04/07/21 1951     (approximate)  I have reviewed the triage vital signs and the nursing notes.   HISTORY  Chief Complaint Weakness    HPI Kristin [name] is a 87 y.o. female who comes in complaining of weakness.  She complains of some chest pain to me which she says has been going on since yesterday.  She told triage nurse no chest pain. Blood pressure on arrival at emergency room was 88 systolic.  Patient's blood pressure comes up but her chest pain remains present.  It is a mild ache.  Has been present constantly since yesterday.  Is not made worse with deep breathing or exercise or movement.  She does not have a cough or fever.      Past Medical History:  Diagnosis Date   Acute postoperative pain 02/09/2018   Arrhythmia, sinus node 04/03/2014   Arthritis    Arthritis    Atrial fibrillation (HCC)    Back pain    lower back chronic   Bradycardia    Breast cancer ([place]) 2004   right breast cancer   Cancer ([place]) 2004   rt breast cancer-post lumpectomy- chemo/rad   CHF (congestive heart failure) (HCC)    Chronic back pain    CKD (chronic kidney disease)    COPD (chronic obstructive pulmonary disease) ([place])    wears O2 at 2L via [place] at night   Cystocele    Decubitus ulcers    Dehydration    Gout    Hematuria    Hepatitis C    Hyperlipidemia    Hypertension    Hypomagnesemia 06/25/2015   OAB (overactive bladder)    Overactive bladder    Restless leg    Shoulder pain, bilateral    Urinary frequency    UTI (lower urinary tract infection)    Vaginal atrophy     Patient Active Problem List   Diagnosis Date Noted   UTI (urinary tract infection) 04/07/2021   Low blood pressure reading 04/07/2021   Physical tolerance to opiate drug 01/28/2021   Chronic use of opiate for therapeutic purpose  11/24/2020   Atherosclerosis of native arteries of the extremities with ulceration ([place]) 65/10/5463   Uncomplicated opioid dependence ([place]) 10/20/2020   Encounter for chronic pain management 10/02/2020   Encounter for pain management counseling 10/02/2020   Overdose opiate, accidental or unintentional, sequela 10/02/2020   Chronic kidney disease 10/02/2020   Chronic sacroiliac pain (Bilateral) 09/30/2020   Osteoarthritis of sacroiliac joints (Bilateral) ([place]) 09/30/2020   Chronic low back pain (Bilateral) w/o sciatica 09/30/2020   Pneumonia 08/03/2020   Prolonged QT interval 08/02/2020   Venous insufficiency of both lower extremities 07/04/2020   COVID-19 06/11/2020   Varicose veins of left lower extremity with inflammation, with ulcer of ankle limited to breakdown of skin ([place]) 04/01/2020   Opioid dependence with opioid-induced disorder ([place]) 02/14/2020   Abnormal MRI, lumbar spine (11/03/2019) 01/23/2020   Pharmacologic therapy 12/20/2019   Other specified dorsopathies, sacral and sacrococcygeal region 06/12/2019   Chronic hip pain (Left) 06/12/2019   Osteoarthritis of hip (Left) 06/12/2019   Greater trochanteric bursitis of hip (Left) 03/12/2019   Preop testing 01/22/2019   Cervicalgia 10/03/2018   Chronic shoulders pain (Bilateral) (L>R) 05/29/2018   Osteoarthritis of shoulder (Bilateral) 05/29/2018   Osteoarthritis of hip (Bilateral) 05/29/2018   Chronic shoulder pain after shoulder  replacement (Bilateral) (L>R) 05/29/2018   Trigger finger, left middle finger 05/29/2018   Spondylosis without myelopathy or radiculopathy, lumbosacral region 02/09/2018   Insomnia 02/01/2018   Numbness and tingling of upper extremity (Bilateral) 09/07/2017   Chronic shoulder pain, Radicular (Bilateral) (L>R) 09/07/2017   Cervical facet syndrome (Bilateral) (L>R) 09/07/2017   Cervical (3-4 mm) Anterolisthesis of C4 on C5 09/07/2017   Cervical foraminal stenosis (Bilateral) 09/07/2017   Chronic  lower extremity pain (2ry area of Pain) (Bilateral) (L>R) 05/13/2017   DDD (degenerative disc disease), cervical 05/13/2017   Chronic cervical radicular pain (3ry area of Pain) (Bilateral) (L>R) 05/04/2017   Chronic anticoagulation (Eliquis) 05/04/2017   Chronic hip pain (Bilateral) 04/20/2017   Chronic neck pain 03/01/2017   Hyperlipidemia 02/02/2017   Red blood cell antibody positive 11/18/2016   Anticoagulated 11/17/2016   Atrial fibrillation and flutter ([place]) 11/17/2016   Osteoarthritis involving multiple joints 06/14/2016   Cardiac pacemaker in situ 01/07/2016   S/P cardiac pacemaker procedure 01/07/2016   GERD (gastroesophageal reflux disease) 12/18/2015   Chronic pain syndrome 06/25/2015   Chronic low back pain (1ry area of Pain) (Bilateral) (L>R) w/ sciatica (Bilateral) 06/25/2015   Lumbar facet syndrome (Bilateral) (L>R) 06/25/2015   Lumbar spondylosis 06/25/2015   Chronic lumbar radicular pain (Bilateral) (L>R) 06/25/2015   Restless leg syndrome 06/25/2015   Myofascial pain 06/25/2015   Neurogenic pain 06/25/2015   Long term current use of opiate analgesic 06/25/2015   Long term prescription opiate use 06/25/2015   Opiate use (90 MME/Day) 06/25/2015   Opioid-induced constipation (OIC) 06/25/2015   Vitamin D insufficiency 06/25/2015   Grade 1 Retrolisthesis of L1 over L2 06/25/2015   Lumbar facet hypertrophy (L1-2, L2-3, and L4-5) 06/25/2015   Lumbar lateral recess stenosis (L1-2 and L4-5) 06/25/2015   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) 06/25/2015   Epidural fibrosis 06/25/2015   Lumbar foraminal stenosis (Severe Left L4-5) 06/25/2015   Osteoporosis 06/25/2015   Chronic sacroiliac joint pain (Left) 06/25/2015   History of methicillin resistant staphylococcus aureus (MRSA) 06/25/2015   Mitral valve prolapse 06/25/2015   Seasonal allergies 06/17/2015   History of breast cancer 06/17/2015   Chronic diastolic CHF (congestive heart  failure) ([place]) 12/26/2014   Centrilobular emphysema ([place]) 09/16/2014   S/P cardiac catheterization 04/04/2014   DDD (degenerative disc disease), lumbosacral 04/03/2014   Benign hypertension with CKD (chronic kidney disease) stage III ([place]) 04/03/2014   Lumbar central spinal stenosis 04/03/2014   Chronic kidney disease (CKD), stage III (moderate) ([place]) 10/30/2013   Gout 10/30/2013    Past Surgical History:  Procedure Laterality Date   ABDOMINAL HYSTERECTOMY     BACK SURGERY     BREAST LUMPECTOMY Right 2004   BREAST SURGERY     rt lumpectomy   CARPAL TUNNEL RELEASE     CATARACT EXTRACTION W/PHACO  10/19/2011   Procedure: CATARACT EXTRACTION PHACO AND INTRAOCULAR LENS PLACEMENT (IOC);  Surgeon: Elta Guadeloupe T. Gershon Crane, MD;  Location: [facility];  Service: Ophthalmology;  Laterality: Right;  CDE:10.81   CATARACT EXTRACTION W/PHACO  11/02/2011   Procedure: CATARACT EXTRACTION PHACO AND INTRAOCULAR LENS PLACEMENT (IOC);  Surgeon: Elta Guadeloupe T. Gershon Crane, MD;  Location: [facility];  Service: Ophthalmology;  Laterality: Left;  CDE 8.60   CHOLECYSTECTOMY     3/18   COLON SURGERY     INSERT / REPLACE / REMOVE PACEMAKER     JOINT REPLACEMENT     bilateral TKA   LOWER EXTREMITY ANGIOGRAPHY Left 10/14/2020   Procedure: LOWER EXTREMITY ANGIOGRAPHY;  Surgeon: Katha Cabal, MD;  Location: [facility];  Service: Cardiovascular;  Laterality: Left;   PACEMAKER INSERTION N/A 12/24/2015   Procedure: INSERTION PACEMAKER;  Surgeon: Isaias Cowman, MD;  Location: [facility];  Service: Cardiovascular;  Laterality: N/A;   ROTATOR CUFF REPAIR     bilateral    Prior to Admission medications   Medication Sig Start Date End Date Taking? Authorizing Provider  acetaminophen (TYLENOL) 500 MG tablet Take 500 mg by mouth in the morning and at bedtime.   Yes [provider]  albuterol (PROVENTIL) (2.5 MG/3ML) 0.083% nebulizer solution Take 3 mLs (2.5 mg total) by nebulization every 8 (eight) hours as needed for  wheezing or shortness of breath. 10/02/19 04/07/21 Yes Karamalegos, Devonne Doughty, DO  amoxicillin-clavulanate (AUGMENTIN) 875-125 MG tablet Take 1 tablet by mouth 2 (two) times daily. For 7 days 04/02/21  Yes Karamalegos, Devonne Doughty, DO  apixaban (ELIQUIS) 5 MG TABS tablet Take 1 tablet (5 mg total) by mouth 2 (two) times daily. 10/30/15  Yes Wieting, [name], MD  aspirin EC 81 MG tablet Take 1 tablet (81 mg total) by mouth daily. Swallow whole. 10/14/20 10/14/21 Yes Schnier, Dolores Lory, MD  atorvastatin (LIPITOR) 20 MG tablet TAKE 1 TABLET BY MOUTH ONCE DAILY FOR CHOLESTEROL 02/17/21  Yes Karamalegos, Devonne Doughty, DO  bisacodyl (DULCOLAX) 5 MG EC tablet Take 2 tablets (10 mg total) by mouth daily as needed for moderate constipation. 07/04/20  Yes Karamalegos, Devonne Doughty, DO  Cholecalciferol (VITAMIN D) 2000 units tablet Take 2,000 Units by mouth daily.    Yes [provider]  furosemide (LASIX) 40 MG tablet TAKE (1) TABLET BY MOUTH ONCE DAILY AS NEEDED. Patient taking differently: Take 20 mg by mouth daily. 09/30/20  Yes Kathrine Haddock, NP  gabapentin (NEURONTIN) 100 MG capsule Take 3 capsules (300 mg total) by mouth at bedtime. 04/02/21  Yes Karamalegos, Devonne Doughty, DO  levocetirizine (XYZAL) 5 MG tablet TAKE (1/2) TABLET BY MOUTH EVERY OTHER DAY. 02/13/21  Yes Karamalegos, Devonne Doughty, DO  lidocaine (XYLOCAINE) 5 % ointment Apply 1 application topically daily as needed. 12/24/20  Yes Kris Hartmann, NP  lisinopril (ZESTRIL) 10 MG tablet Take 10 mg by mouth daily.   Yes [provider]  Melatonin 10 MG TABS Take 10 mg by mouth at bedtime.   Yes [provider]  oxyCODONE 10 MG TABS Take 1 tablet (10 mg total) by mouth in the morning and at bedtime. Must last 30 days. 04/03/21 05/03/21 Yes Milinda Pointer, MD  OXYGEN Inhale 2 L into the lungs at bedtime.    Yes [provider]  rOPINIRole (REQUIP) 2 MG tablet TAKE 2 TABLETS BY MOUTH AT BEDTIME 01/28/21  Yes Karamalegos,  Devonne Doughty, DO  solifenacin (VESICARE) 5 MG tablet Take 1 tablet by mouth daily. 03/08/16  Yes [provider]  Tiotropium Bromide-Olodaterol (STIOLTO RESPIMAT) 2.5-2.5 MCG/ACT AERS Inhale into the lungs. 12/10/20  Yes [provider]  umeclidinium-vilanterol (ANORO ELLIPTA) 62.5-25 MCG/INH AEPB Inhale 1 puff into the lungs daily. 12/03/20  Yes [provider]  oxyCODONE 10 MG TABS Take 1 tablet (10 mg total) by mouth in the morning and at bedtime. Must last 30 days. 03/04/21 04/03/21  Milinda Pointer, MD  oxyCODONE 10 MG TABS Take 1 tablet (10 mg total) by mouth in the morning and at bedtime. Must last 30 days. 05/03/21 06/02/21  Milinda Pointer, MD  Wheat Dextrin (BENEFIBER) POWD Take 6 g by mouth 3 (three) times daily before  meals. (2 tsp = 6 g) Patient not taking: Reported on 01/27/2021 08/01/19 07/31/20  Milinda Pointer, MD    Allergies Flexeril [cyclobenzaprine hcl] and Vancomycin  Family History  Problem Relation Age of Onset   Kidney disease Brother    Heart disease Father    Heart disease Mother    Stroke Mother    Cancer Sister    Breast cancer Sister 11   Breast cancer Paternal Aunt    Anesthesia problems Neg Hx    Hypotension Neg Hx    Malignant hyperthermia Neg Hx    Pseudochol deficiency Neg Hx     Social History Social History   Tobacco Use   Smoking status: Former    Packs/day: 1.25    Years: 40.00    Pack years: 50.00    Types: Cigarettes    Quit date: 12/16/1998    Years since quitting: 22.3   Smokeless tobacco: Former  Scientific laboratory technician Use: Never used  Substance Use Topics   Alcohol use: No    Alcohol/week: 0.0 standard drinks   Drug use: Yes    Types: Oxycodone    Review of Systems  Constitutional: No fever/chills Eyes: No visual changes. ENT: No sore throat. Cardiovascular:  chest pain. Respiratory: Denies shortness of breath. Gastrointestinal: No abdominal pain.  No nausea, no vomiting.  No diarrhea.  No  constipation. Genitourinary: Negative for dysuria. Musculoskeletal: Negative for back pain. Skin: Negative for rash. Neurological: Negative for headaches, focal weakness  ____________________________________________   PHYSICAL EXAM:  VITAL SIGNS: ED Triage Vitals  Enc Vitals Group     BP 04/07/21 1922 (!) 88/72     Pulse Rate 04/07/21 1922 66     Resp 04/07/21 1922 20     Temp 04/07/21 1922 97.9 F (36.6 C)     Temp Source 04/07/21 1922 Oral     SpO2 04/07/21 1922 100 %     Weight 04/07/21 1919 144 lb (65.3 kg)     Height 04/07/21 1919 5\' 7"  (1.702 m)     Head Circumference --      Peak Flow --      Pain Score 04/07/21 1919 0     Pain Loc --      Pain Edu? --      Excl. in [place]? --     Constitutional: Alert and oriented. Well appearing and in no acute distress. Eyes: Conjunctivae are normal. PER. EOMI. Head: Atraumatic. Nose: No congestion/rhinnorhea. Mouth/Throat: Mucous membranes are moist.  Oropharynx non-erythematous. Neck: No stridor.  Cardiovascular: Normal rate, regular rhythm. Grossly normal heart sounds.  Good peripheral circulation. Respiratory: Normal respiratory effort.  No retractions. Lungs CTAB. Gastrointestinal: Soft and nontender. No distention. No abdominal bruits.  Musculoskeletal: No lower extremity tenderness nor edema.  Neurologic:  Normal speech and language. No gross focal neurologic deficits are appreciated.  Skin:  Skin is warm, dry and intact. No rash noted.   ____________________________________________   LABS (all labs ordered are listed, but only abnormal results are displayed)  Labs Reviewed  BASIC METABOLIC PANEL - Abnormal; Notable for the following components:      Result Value   Sodium 134 (*)    Potassium 5.3 (*)    CO2 18 (*)    BUN 71 (*)    Creatinine, Ser 2.33 (*)    GFR, Estimated 20 (*)    All other components within normal limits  TROPONIN I (HIGH SENSITIVITY) - Abnormal; Notable for the following components:  Troponin I (High Sensitivity) 28 (*)    All other components within normal limits  TROPONIN I (HIGH SENSITIVITY) - Abnormal; Notable for the following components:   Troponin I (High Sensitivity) 26 (*)    All other components within normal limits  RESP PANEL BY RT-PCR (FLU A&B, COVID) [id number]  CBC   ____________________________________________  EKG  EKG read interpreted by me shows atrial pacing right bundle branch block and left anterior hemiblock.  There are flipped T's in 3 and F and V3 through 5 which are new from prior EKG last year. __EKG #2 read interpreted by me shows apparently sinus rhythm with very prolonged PR interval.  There are some areas where there may be pacer spikes but they are not quite as obvious is on the other EKG.  She has right bundle branch block and left anterior hemiblock.  The flipped T's are present but not quite as prominent as previously.  Baseline is much improved from the previous EKG.  I am able to see some ST elevation in V45 and 6 but this was present and March of this year.  __________________________________________  RADIOLOGY Gertha Calkin, personally viewed and evaluated these images (plain radiographs) as part of my medical decision making, as well as reviewing the written report by the radiologist.  ED MD interpretation: Chest x-ray read by radiology reviewed by me shows pacer in place no obvious acute abnormalities  Official radiology report(s): DG Chest 2 View  Result Date: 04/07/2021 CLINICAL DATA:  Weakness EXAM: CHEST - 2 VIEW COMPARISON:  11/07/2020 FINDINGS: Cardiac shadow is enlarged but stable. Aortic calcifications are noted. Pacing device is again seen and stable. The lungs are well aerated without focal infiltrate or sizable effusion. Postsurgical changes in the shoulders are seen. IMPRESSION: No acute abnormality noted. Electronically Signed   By: [name] M.D.   On: 04/07/2021 20:04     ____________________________________________   PROCEDURES  Procedure(s) performed (including Critical Care):  Procedures   ____________________________________________   INITIAL IMPRESSION / ASSESSMENT AND PLAN / ED COURSE  Patient with abnormal EKG and episode of hypotension and some atypical chest pain.  I will put her in the hospital and monitor her rule out STEMI or NSTEMI and see if there is any other obvious reason for her hypotension.  I do not see any sign of sepsis or pneumonia or other infection at this point.  She does have some AKI and possibly is dehydrated.              ____________________________________________   FINAL CLINICAL IMPRESSION(S) / ED DIAGNOSES  Final diagnoses:  Weakness  Hypotension, unspecified hypotension type  Abnormal EKG  AKI (acute kidney injury) [facility])     ED Discharge Orders     None        Note:  This document was prepared using Dragon voice recognition software and may include unintentional dictation errors.    Nena Polio, MD 04/07/21 2342

## 2021-04-07 NOTE — ED Triage Notes (Addendum)
Pt brought in via ems from home with weakness.  Pt taking meds for uti.  Pt has urinary frequency.  Pt alert  speech clear.  Pt on 2 liters oxygen [place].  Hx copd.  No chest pain.  Pt has intermittent sob.

## 2021-04-08 ENCOUNTER — Other Ambulatory Visit: Payer: Self-pay

## 2021-04-08 ENCOUNTER — Observation Stay: Payer: Medicare HMO

## 2021-04-08 DIAGNOSIS — Z8679 Personal history of other diseases of the circulatory system: Secondary | ICD-10-CM

## 2021-04-08 DIAGNOSIS — N183 Chronic kidney disease, stage 3 unspecified: Secondary | ICD-10-CM | POA: Diagnosis not present

## 2021-04-08 DIAGNOSIS — I5032 Chronic diastolic (congestive) heart failure: Secondary | ICD-10-CM | POA: Diagnosis not present

## 2021-04-08 DIAGNOSIS — I10 Essential (primary) hypertension: Secondary | ICD-10-CM | POA: Diagnosis not present

## 2021-04-08 DIAGNOSIS — R7989 Other specified abnormal findings of blood chemistry: Secondary | ICD-10-CM | POA: Diagnosis not present

## 2021-04-08 DIAGNOSIS — I739 Peripheral vascular disease, unspecified: Secondary | ICD-10-CM

## 2021-04-08 DIAGNOSIS — N179 Acute kidney failure, unspecified: Secondary | ICD-10-CM | POA: Diagnosis not present

## 2021-04-08 DIAGNOSIS — M47816 Spondylosis without myelopathy or radiculopathy, lumbar region: Secondary | ICD-10-CM

## 2021-04-08 DIAGNOSIS — I214 Non-ST elevation (NSTEMI) myocardial infarction: Secondary | ICD-10-CM | POA: Diagnosis not present

## 2021-04-08 DIAGNOSIS — I200 Unstable angina: Secondary | ICD-10-CM | POA: Diagnosis not present

## 2021-04-08 DIAGNOSIS — M545 Low back pain, unspecified: Secondary | ICD-10-CM | POA: Diagnosis not present

## 2021-04-08 DIAGNOSIS — Z950 Presence of cardiac pacemaker: Secondary | ICD-10-CM

## 2021-04-08 DIAGNOSIS — I959 Hypotension, unspecified: Secondary | ICD-10-CM | POA: Diagnosis not present

## 2021-04-08 HISTORY — DX: Non-ST elevation (NSTEMI) myocardial infarction: I21.4

## 2021-04-08 LAB — BASIC METABOLIC PANEL
Anion gap: 7 (ref 5–15)
BUN: 60 mg/dL — ABNORMAL HIGH (ref 8–23)
CO2: 19 mmol/L — ABNORMAL LOW (ref 22–32)
Calcium: 9.3 mg/dL (ref 8.9–10.3)
Chloride: 112 mmol/L — ABNORMAL HIGH (ref 98–111)
Creatinine, Ser: 1.7 mg/dL — ABNORMAL HIGH (ref 0.44–1.00)
GFR, Estimated: 28 mL/min — ABNORMAL LOW (ref >60)
Glucose, Bld: 73 mg/dL (ref 70–99)
Potassium: 5 mmol/L (ref 3.5–5.1)
Sodium: 138 mmol/L (ref 135–145)

## 2021-04-08 LAB — PROTIME-INR
INR: 1.2 (ref 0.8–1.2)
Prothrombin Time: 15.4 seconds — ABNORMAL HIGH (ref 11.4–15.2)

## 2021-04-08 LAB — CBC
HCT: 40.2 % (ref 36.0–46.0)
Hemoglobin: 12.8 g/dL (ref 12.0–15.0)
MCH: 30.8 pg (ref 26.0–34.0)
MCHC: 31.8 g/dL (ref 30.0–36.0)
MCV: 96.9 fL (ref 80.0–100.0)
Platelets: 138 10*3/uL — ABNORMAL LOW (ref 150–400)
RBC: 4.15 MIL/uL (ref 3.87–5.11)
RDW: 14.1 % (ref 11.5–15.5)
WBC: 9.5 10*3/uL (ref 4.0–10.5)
nRBC: 0 % (ref 0.0–0.2)

## 2021-04-08 LAB — APTT
aPTT: 32 seconds (ref 24–36)
aPTT: 110 seconds — ABNORMAL HIGH (ref 24–36)

## 2021-04-08 LAB — TROPONIN I (HIGH SENSITIVITY)
Troponin I (High Sensitivity): 24 ng/L — ABNORMAL HIGH (ref <18)
Troponin I (High Sensitivity): 27 ng/L — ABNORMAL HIGH (ref <18)

## 2021-04-08 LAB — HEPARIN LEVEL (UNFRACTIONATED): Heparin Unfractionated: >1.1 IU/mL — ABNORMAL HIGH (ref 0.30–0.70)

## 2021-04-08 MED ORDER — FUROSEMIDE 40 MG PO TABS
20.0000 mg | ORAL_TABLET | Freq: Every day | ORAL | Status: DC
  Administered 2021-04-08: 20 mg via ORAL
  Filled 2021-04-08: qty 1

## 2021-04-08 MED ORDER — HEPARIN (PORCINE) 25000 UT/250ML-% IV SOLN
700.0000 [IU]/h | INTRAVENOUS | Status: DC
  Administered 2021-04-08: 750 [IU]/h via INTRAVENOUS
  Filled 2021-04-08: qty 250

## 2021-04-08 MED ORDER — OXYCODONE HCL 5 MG PO TABS
5.0000 mg | ORAL_TABLET | Freq: Two times a day (BID) | ORAL | Status: DC
  Administered 2021-04-08 – 2021-04-09 (×4): 5 mg via ORAL
  Filled 2021-04-08 (×4): qty 1

## 2021-04-08 MED ORDER — APIXABAN 2.5 MG PO TABS
2.5000 mg | ORAL_TABLET | Freq: Two times a day (BID) | ORAL | Status: DC
  Administered 2021-04-08 – 2021-04-09 (×3): 2.5 mg via ORAL
  Filled 2021-04-08 (×5): qty 1

## 2021-04-08 MED ORDER — ACETAMINOPHEN 500 MG PO TABS
500.0000 mg | ORAL_TABLET | Freq: Two times a day (BID) | ORAL | Status: DC
  Administered 2021-04-08 – 2021-04-09 (×3): 500 mg via ORAL
  Filled 2021-04-08 (×3): qty 1

## 2021-04-08 MED ORDER — LACTATED RINGERS IV SOLN: INTRAVENOUS | Status: DC

## 2021-04-08 MED ORDER — ROPINIROLE HCL 1 MG PO TABS
4.0000 mg | ORAL_TABLET | Freq: Every day | ORAL | Status: DC
  Administered 2021-04-08 (×2): 4 mg via ORAL
  Filled 2021-04-08 (×3): qty 4

## 2021-04-08 MED ORDER — DARIFENACIN HYDROBROMIDE ER 7.5 MG PO TB24
7.5000 mg | ORAL_TABLET | Freq: Every day | ORAL | Status: DC
  Administered 2021-04-08 – 2021-04-09 (×2): 7.5 mg via ORAL
  Filled 2021-04-08 (×2): qty 1

## 2021-04-08 MED ORDER — VITAMIN D 25 MCG (1000 UNIT) PO TABS
2000.0000 [IU] | ORAL_TABLET | Freq: Every day | ORAL | Status: DC
  Administered 2021-04-08 – 2021-04-09 (×2): 2000 [IU] via ORAL
  Filled 2021-04-08 (×2): qty 2

## 2021-04-08 MED ORDER — HEPARIN BOLUS VIA INFUSION
3900.0000 [IU] | Freq: Once | INTRAVENOUS | Status: AC
  Administered 2021-04-08: 3900 [IU] via INTRAVENOUS
  Filled 2021-04-08: qty 3900

## 2021-04-08 MED ORDER — ATORVASTATIN CALCIUM 20 MG PO TABS
20.0000 mg | ORAL_TABLET | Freq: Every day | ORAL | Status: DC
  Administered 2021-04-08 – 2021-04-09 (×2): 20 mg via ORAL
  Filled 2021-04-08 (×2): qty 1

## 2021-04-08 MED ORDER — ASPIRIN EC 81 MG PO TBEC
81.0000 mg | DELAYED_RELEASE_TABLET | Freq: Every day | ORAL | Status: DC
  Administered 2021-04-08 – 2021-04-09 (×2): 81 mg via ORAL
  Filled 2021-04-08 (×2): qty 1

## 2021-04-08 MED ORDER — GABAPENTIN 300 MG PO CAPS
300.0000 mg | ORAL_CAPSULE | Freq: Every day | ORAL | Status: DC
  Administered 2021-04-08 (×2): 300 mg via ORAL
  Filled 2021-04-08 (×2): qty 1

## 2021-04-08 MED ORDER — MELATONIN 5 MG PO TABS
10.0000 mg | ORAL_TABLET | Freq: Every day | ORAL | Status: DC
  Administered 2021-04-08 (×2): 10 mg via ORAL
  Filled 2021-04-08 (×2): qty 2

## 2021-04-08 MED ORDER — UMECLIDINIUM-VILANTEROL 62.5-25 MCG/INH IN AEPB
1.0000 | INHALATION_SPRAY | Freq: Every day | RESPIRATORY_TRACT | Status: DC
  Administered 2021-04-08 – 2021-04-09 (×2): 1 via RESPIRATORY_TRACT
  Filled 2021-04-08: qty 14

## 2021-04-08 MED ORDER — CETIRIZINE HCL 10 MG PO TABS
5.0000 mg | ORAL_TABLET | ORAL | Status: DC
  Filled 2021-04-08: qty 1

## 2021-04-08 MED ORDER — BISACODYL 5 MG PO TBEC
10.0000 mg | DELAYED_RELEASE_TABLET | Freq: Every day | ORAL | Status: DC | PRN
Start: 2021-04-08 — End: 2021-04-09

## 2021-04-08 NOTE — ED Notes (Signed)
Assisted pt to the toilet to have a BM. Instructed to pull the call bell when she is done so that someone can help her back to bed and reapply monitor. Pt verbalized understanding.

## 2021-04-08 NOTE — ED Notes (Signed)
Unable to interrogate patient's pacemaker due to pt not knowing what type of pacemaker she has.

## 2021-04-08 NOTE — Progress Notes (Addendum)
Patient here via stretcher. Ambulated x 2 assist with walker. Assisted to bathroom. Small BM noted.  Continent of b/b. [place] replaced. Gown changed. Set up on telemetry with 2nd verifier. Patient answers questions appropriately. Alert and oriented x 4. No c/o pain. Staff introductions. Bed alarm on. Mats at bedside and oriented to room. Call light and belongings within reach. No skin issues noted. Vitals stable. Has history of PM, noted to left upper chest. Paced on telemetry monitor.

## 2021-04-08 NOTE — Consult Note (Signed)
ANTICOAGULATION CONSULT NOTE  Pharmacy Consult for IV Heparin Indication: chest pain/ACS  Patient Measurements: Heparin Dosing Weight: 65.3 kg  Labs: Recent Labs    04/07/21 1923 04/07/21 2128  HGB 12.6  --   HCT 39.3  --   PLT 152  --   CREATININE 2.33*  --   TROPONINIHS 28* 26*    Estimated Creatinine Clearance: 15.9 mL/min (A) (by C-G formula based on SCr of 2.33 mg/dL (H)).   Medical History: Past Medical History:  Diagnosis Date   Acute postoperative pain 02/09/2018   Arrhythmia, sinus node 04/03/2014   Arthritis    Arthritis    Atrial fibrillation (HCC)    Back pain    lower back chronic   Bradycardia    Breast cancer ([place]) 2004   right breast cancer   Cancer ([place]) 2004   rt breast cancer-post lumpectomy- chemo/rad   CHF (congestive heart failure) (HCC)    Chronic back pain    CKD (chronic kidney disease)    COPD (chronic obstructive pulmonary disease) ([place])    wears O2 at 2L via [place] at night   Cystocele    Decubitus ulcers    Dehydration    Gout    Hematuria    Hepatitis C    Hyperlipidemia    Hypertension    Hypomagnesemia 06/25/2015   OAB (overactive bladder)    Overactive bladder    Restless leg    Shoulder pain, bilateral    Urinary frequency    UTI (lower urinary tract infection)    Vaginal atrophy     Medications:  Apixaban 5 mg BID prior to admission (last patient reported dose 8/16 at 0800)  Assessment: Patient is an 87 y/o F with medical history as above and including Afib on apixaban who is admitted with weakness. Troponin 28 >> 26. Pharmacy consulted to initiate heparin infusion for suspected NSTEMI.  Baseline CBC acceptable. Baseline aPTT, PT-INR, and anti-Xa level are pending.   Goal of Therapy:  Heparin level 0.3-0.7 units/ml aPTT 66 - 102 seconds Monitor platelets by anticoagulation protocol: Yes   Plan:  --Heparin 3900 unit IV bolus followed by continuous infusion at 750 units/hr --Will check aPTT 8 hours after initiation  of infusion. Follow aPTT for now given presumed interference of apixaban on anti-Xa levels. Can follow anti-Xa levels once correlation is established or if baseline anti-Xa level is low --Daily CBC per protocol  Benita Gutter 04/08/2021,1:30 AM

## 2021-04-08 NOTE — ED Notes (Signed)
Hospitalist at the bedside 

## 2021-04-08 NOTE — ED Notes (Signed)
Lab at the bedside 

## 2021-04-08 NOTE — ED Notes (Signed)
Lab to come draw PTT

## 2021-04-08 NOTE — Consult Note (Addendum)
[facility]iology Consultation Note  [name], MRN: [id number], DOB/AGE: 85/16/1931 85 y.o. Admit date: 04/07/2021   Date of Consult: 04/08/2021 Primary Physician: Olin Hauser, DO Primary Cardiologist: Dr. Saralyn Pilar  Chief Complaint:  Chief Complaint  Patient presents with   Weakness   Reason for Consult:  Possible NSTEMI  HPI: 87 y.o. female with a past medical history significant for chronic hypoxic respiratory failure on oxygen, dual-chamber pacemaker for sinus bradycardia, emphysema, chronic diastolic congestive heart failure, CKD stage III, hypertension, atrial fibrillation on Eliquis, PVD.  Patient initially presented to the ER with weakness and leg pain over the past 2 days.  Patient states that she feels that she is not ambulating well over the past few days attributable to leg and foot pain.  She was initially found to be hypotensive in the ED with a BP of 88/72 and showed improvement with IV fluids.  Initial troponin of 28 trending down to 26.  EKG showed new T wave inversions on V4 and V5.  Patient denies any chest pain, shortness of breath at this time and states that her primary reason for coming to the hospital is weakness and lower leg pain.  Patient states that she is chronically short of breath and does not feel that her shortness of breath is markedly different than her baseline.  Patient does not appear fluid overloaded at this time.  Past Medical History:  Diagnosis Date   Acute postoperative pain 02/09/2018   Arrhythmia, sinus node 04/03/2014   Arthritis    Arthritis    Atrial fibrillation (HCC)    Back pain    lower back chronic   Bradycardia    Breast cancer ([place]) 2004   right breast cancer   Cancer ([place]) 2004   rt breast cancer-post lumpectomy- chemo/rad   CHF (congestive heart failure) (HCC)    Chronic back pain    CKD (chronic kidney disease)    COPD (chronic obstructive pulmonary disease) ([place])    wears O2 at 2L via [place]  at night   Cystocele    Decubitus ulcers    Dehydration    Gout    Hematuria    Hepatitis C    Hyperlipidemia    Hypertension    Hypomagnesemia 06/25/2015   OAB (overactive bladder)    Overactive bladder    Restless leg    Shoulder pain, bilateral    Urinary frequency    UTI (lower urinary tract infection)    Vaginal atrophy       Surgical History:  Past Surgical History:  Procedure Laterality Date   ABDOMINAL HYSTERECTOMY     BACK SURGERY     BREAST LUMPECTOMY Right 2004   BREAST SURGERY     rt lumpectomy   CARPAL TUNNEL RELEASE     CATARACT EXTRACTION W/PHACO  10/19/2011   Procedure: CATARACT EXTRACTION PHACO AND INTRAOCULAR LENS PLACEMENT ([place]);  Surgeon: Elta Guadeloupe T. Gershon Crane, MD;  Location: [facility];  Service: Ophthalmology;  Laterality: Right;  CDE:10.81   CATARACT EXTRACTION W/PHACO  11/02/2011   Procedure: CATARACT EXTRACTION PHACO AND INTRAOCULAR LENS PLACEMENT ([facility]);  Surgeon: Elta Guadeloupe T. Gershon Crane, MD;  Location: [facility];  Service: Ophthalmology;  Laterality: Left;  CDE 8.60   CHOLECYSTECTOMY     3/18   COLON SURGERY     INSERT / REPLACE / REMOVE PACEMAKER     JOINT REPLACEMENT     bilateral TKA   LOWER EXTREMITY ANGIOGRAPHY Left 10/14/2020   Procedure: LOWER EXTREMITY  ANGIOGRAPHY;  Surgeon: Katha Cabal, MD;  Location: [facility];  Service: Cardiovascular;  Laterality: Left;   PACEMAKER INSERTION N/A 12/24/2015   Procedure: INSERTION PACEMAKER;  Surgeon: Isaias Cowman, MD;  Location: [facility];  Service: Cardiovascular;  Laterality: N/A;   ROTATOR CUFF REPAIR     bilateral     Home Meds: Prior to Admission medications   Medication Sig Start Date End Date Taking? Authorizing Provider  acetaminophen (TYLENOL) 500 MG tablet Take 500 mg by mouth in the morning and at bedtime.   Yes [provider]  albuterol (PROVENTIL) (2.5 MG/3ML) 0.083% nebulizer solution Take 3 mLs (2.5 mg total) by nebulization every 8 (eight) hours as needed for wheezing or  shortness of breath. 10/02/19 04/07/21 Yes Karamalegos, Devonne Doughty, DO  amoxicillin-clavulanate (AUGMENTIN) 875-125 MG tablet Take 1 tablet by mouth 2 (two) times daily. For 7 days 04/02/21  Yes Karamalegos, Devonne Doughty, DO  apixaban (ELIQUIS) 5 MG TABS tablet Take 1 tablet (5 mg total) by mouth 2 (two) times daily. 10/30/15  Yes Wieting, [name], MD  aspirin EC 81 MG tablet Take 1 tablet (81 mg total) by mouth daily. Swallow whole. 10/14/20 10/14/21 Yes Schnier, Dolores Lory, MD  atorvastatin (LIPITOR) 20 MG tablet TAKE 1 TABLET BY MOUTH ONCE DAILY FOR CHOLESTEROL 02/17/21  Yes Karamalegos, Devonne Doughty, DO  bisacodyl (DULCOLAX) 5 MG EC tablet Take 2 tablets (10 mg total) by mouth daily as needed for moderate constipation. 07/04/20  Yes Karamalegos, Devonne Doughty, DO  Cholecalciferol (VITAMIN D) 2000 units tablet Take 2,000 Units by mouth daily.    Yes [provider]  furosemide (LASIX) 40 MG tablet TAKE (1) TABLET BY MOUTH ONCE DAILY AS NEEDED. Patient taking differently: Take 20 mg by mouth daily. 09/30/20  Yes Kathrine Haddock, NP  gabapentin (NEURONTIN) 100 MG capsule Take 3 capsules (300 mg total) by mouth at bedtime. 04/02/21  Yes Karamalegos, Devonne Doughty, DO  levocetirizine (XYZAL) 5 MG tablet TAKE (1/2) TABLET BY MOUTH EVERY OTHER DAY. 02/13/21  Yes Karamalegos, Devonne Doughty, DO  lidocaine (XYLOCAINE) 5 % ointment Apply 1 application topically daily as needed. 12/24/20  Yes Kris Hartmann, NP  lisinopril (ZESTRIL) 10 MG tablet Take 10 mg by mouth daily.   Yes [provider]  Melatonin 10 MG TABS Take 10 mg by mouth at bedtime.   Yes [provider]  oxyCODONE 10 MG TABS Take 1 tablet (10 mg total) by mouth in the morning and at bedtime. Must last 30 days. 04/03/21 05/03/21 Yes Milinda Pointer, MD  OXYGEN Inhale 2 L into the lungs at bedtime.    Yes [provider]  rOPINIRole (REQUIP) 2 MG tablet TAKE 2 TABLETS BY MOUTH AT BEDTIME 01/28/21  Yes Karamalegos, Devonne Doughty, DO   solifenacin (VESICARE) 5 MG tablet Take 1 tablet by mouth daily. 03/08/16  Yes [provider]  Tiotropium Bromide-Olodaterol (STIOLTO RESPIMAT) 2.5-2.5 MCG/ACT AERS Inhale into the lungs. 12/10/20  Yes [provider]  umeclidinium-vilanterol (ANORO ELLIPTA) 62.5-25 MCG/INH AEPB Inhale 1 puff into the lungs daily. 12/03/20  Yes [provider]  oxyCODONE 10 MG TABS Take 1 tablet (10 mg total) by mouth in the morning and at bedtime. Must last 30 days. 03/04/21 04/03/21  Milinda Pointer, MD  oxyCODONE 10 MG TABS Take 1 tablet (10 mg total) by mouth in the morning and at bedtime. Must last 30 days. 05/03/21 06/02/21  Milinda Pointer, MD  Wheat Dextrin (BENEFIBER) POWD Take 6 g by mouth  3 (three) times daily before meals. (2 tsp = 6 g) Patient not taking: Reported on 01/27/2021 08/01/19 07/31/20  Milinda Pointer, MD    Inpatient Medications:   acetaminophen  500 mg Oral BID   aspirin EC  81 mg Oral Daily   atorvastatin  20 mg Oral Daily   cholecalciferol  2,000 Units Oral Daily   darifenacin  7.5 mg Oral Daily   gabapentin  300 mg Oral QHS   melatonin  10 mg Oral QHS   oxyCODONE  5 mg Oral BID   rOPINIRole  4 mg Oral QHS   umeclidinium-vilanterol  1 puff Inhalation Daily    heparin 750 Units/hr (04/08/21 8250)   lactated ringers 50 mL/hr at 04/08/21 0370    Allergies:  Allergies  Allergen Reactions   Flexeril [Cyclobenzaprine Hcl]     Severe Rash   Vancomycin Rash    [place] Syndrome    Social History   Socioeconomic History   Marital status: Single    Spouse name: Not on file   Number of children: 2   Years of education: 12   Highest education level: 12th grade  Occupational History   Occupation: retired  Tobacco Use   Smoking status: Former    Packs/day: 1.25    Years: 40.00    Pack years: 50.00    Types: Cigarettes    Quit date: 12/16/1998    Years since quitting: 22.3   Smokeless tobacco: Former  Scientific laboratory technician Use: Never used   Substance and Sexual Activity   Alcohol use: No    Alcohol/week: 0.0 standard drinks   Drug use: Yes    Types: Oxycodone   Sexual activity: Not Currently    Birth control/protection: Surgical  Other Topics Concern   Not on file  Social History Narrative   Not on file   Social Determinants of Health   Financial Resource Strain: Low Risk    Difficulty of Paying Living Expenses: Not hard at all  Food Insecurity: No Food Insecurity   Worried About Charity fundraiser in the Last Year: Never true   [place] in the Last Year: Never true  Transportation Needs: No Transportation Needs   Lack of Transportation (Medical): No   Lack of Transportation (Non-Medical): No  Physical Activity: Inactive   Days of Exercise per Week: 0 days   Minutes of Exercise per Session: 0 min  Stress: No Stress Concern Present   Feeling of Stress : Not at all  Social Connections: Not on file  Intimate Partner Violence: Not on file     Family History  Problem Relation Age of Onset   Kidney disease Brother    Heart disease Father    Heart disease Mother    Stroke Mother    Cancer Sister    Breast cancer Sister 41   Breast cancer Paternal Aunt    Anesthesia problems Neg Hx    Hypotension Neg Hx    Malignant hyperthermia Neg Hx    Pseudochol deficiency Neg Hx      Review of Systems Positive for weakness, leg pain Negative for: General:  chills, fever, night sweats or weight changes.  Cardiovascular: PND orthopnea syncope dizziness  Dermatological skin lesions rashes Respiratory: Cough congestion Urologic: Frequent urination urination at night and hematuria Abdominal: negative for nausea, vomiting, diarrhea, bright red blood per rectum, melena, or hematemesis Neurologic: negative for visual changes, and/or hearing changes  All other systems reviewed and are otherwise  negative except as noted above.  Labs: No results for input(s): CKTOTAL, CKMB, TROPONINI in the last 72 hours. Lab  Results  Component Value Date   WBC 9.5 04/08/2021   HGB 12.8 04/08/2021   HCT 40.2 04/08/2021   MCV 96.9 04/08/2021   PLT 138 (L) 04/08/2021    Recent Labs  Lab 04/08/21 0805  NA 138  K 5.0  CL 112*  CO2 19*  BUN 60*  CREATININE 1.70*  CALCIUM 9.3  GLUCOSE 73   Lab Results  Component Value Date   CHOL 158 04/02/2021   HDL 68 04/02/2021   LDLCALC 77 04/02/2021   TRIG 57 04/02/2021   No results found for: DDIMER  Radiology/Studies:  DG Chest 2 View  Result Date: 04/07/2021 CLINICAL DATA:  Weakness EXAM: CHEST - 2 VIEW COMPARISON:  11/07/2020 FINDINGS: Cardiac shadow is enlarged but stable. Aortic calcifications are noted. Pacing device is again seen and stable. The lungs are well aerated without focal infiltrate or sizable effusion. Postsurgical changes in the shoulders are seen. IMPRESSION: No acute abnormality noted. Electronically Signed   By: [name] M.D.   On: 04/07/2021 20:04   DG Lumbar Spine 2-3 Views  Result Date: 04/08/2021 CLINICAL DATA:  Low back pain. EXAM: LUMBAR SPINE - 2-3 VIEW COMPARISON:  February 25, 2018 FINDINGS: There is no evidence of acute lumbar spine fracture. There is moderate to marked severity levoscoliosis. Marked severity multilevel endplate sclerosis is seen with marked severity multilevel intervertebral disc space narrowing. IMPRESSION: 1. Moderate to marked severity levoscoliosis with marked severity multilevel degenerative changes. Electronically Signed   By: Virgina Norfolk M.D.   On: 04/08/2021 01:29   DG PAIN CLINIC C-ARM 1-60 MIN NO REPORT  Result Date: 03/24/2021 Fluoro was used, but no Radiologist interpretation will be provided. Please refer to "NOTES" tab for provider progress note.   EKG: Initial EKG on 04/07/2021 showed possible new T wave inversions in V4 and V5.  This EKG appeared to have possible incorrect lead placement.  Repeat EKG on 04/08/2021 showed no evidence of T wave inversion on V4 and V5, normal sinus rhythm at a  ventricular rate of 60 bpm.  Weights: Filed Weights   04/07/21 1919  Weight: 65.3 kg     Physical Exam: Blood pressure (!) 159/92, pulse 64, temperature 97.9 F (36.6 C), temperature source Oral, resp. rate 15, height 5\' 7"  (1.702 m), weight 65.3 kg, SpO2 99 %. Body mass index is 22.55 kg/m. General: Well developed, well nourished, in no acute distress. Head eyes ears nose throat: Normocephalic, atraumatic, sclera non-icteric, no xanthomas, nares are without discharge. No apparent thyromegaly and/or mass  Lungs: Normal respiratory effort.  no wheezes, mild bibasilar crackles, no rhonchi.  Heart: RRR with normal S1 S2. no murmur gallop, no rub, PMI is normal size and placement, carotid upstroke normal without bruit, jugular venous pressure is normal Abdomen: Soft, non-tender, non-distended with normoactive bowel sounds. No hepatomegaly. No rebound/guarding. No obvious abdominal masses. Abdominal aorta is normal size without bruit Extremities: No edema. no cyanosis, no clubbing, no ulcers  Peripheral : 2+ bilateral upper extremity pulses, 2+ bilateral femoral pulses, 2+ bilateral dorsal pedal pulse Neuro: Alert and oriented. No facial asymmetry. No focal deficit. Moves all extremities spontaneously. Musculoskeletal: Normal muscle tone without kyphosis Psych:  Responds to questions appropriately with a normal affect.    Assessment: 1.  Mildly elevated troponin with possible new T wave inversions in V4 and V5 by EKG 2.  Hypotension 3.  History  of sinus bradycardia s/p pacemaker insertion 4.  Acute kidney injury superimposed on CKD stage III 5.  Chronic diastolic heart failure appearing euvolemic at this time 6.  History of A. fib on chronic Eliquis held at this time while on heparin infusion  Plan: -No current definitive evidence of ACS. Repeat EKG for secondary evaluation of T wave inversions in V4 and V5 showed no evidence of T wave inversions.  Initial EKG with T wave inversions in  V4 and V5 was likely secondary to probable incorrect lead placement and or elctrolyte abnormality -Continue daily aspirin and atorvastatin -Continue IV fluids as needed for hypotension likely secondary to overmedication with Lasix -Patient is currently stable from a cardiovascular standpoint with no definitive evidence of acute coronary syndrome. -No further cardiac diagnostic testing necessary at this time -call if further question Signed, Jettie Booze [facility] 04/08/2021, 10:26 AM The patient has been interviewed and examined. I agree with assessment and plan above. Serafina Royals MD [facility]

## 2021-04-08 NOTE — Consult Note (Signed)
[place] for IV Heparin Indication: chest pain/ACS  Patient Measurements: Heparin Dosing Weight: 65.3 kg  Labs: Recent Labs    04/07/21 1923 04/07/21 2128 04/08/21 0146 04/08/21 0411 04/08/21 0805 04/08/21 1137  HGB 12.6  --   --   --  12.8  --   HCT 39.3  --   --   --  40.2  --   PLT 152  --   --   --  138*  --   APTT  --   --  32  --   --  110*  LABPROT  --   --  15.4*  --   --   --   INR  --   --  1.2  --   --   --   HEPARINUNFRC  --   --  >1.10*  --   --   --   CREATININE 2.33*  --   --   --  1.70*  --   TROPONINIHS 28* 26* 24* 27*  --   --      Estimated Creatinine Clearance: 21.8 mL/min (A) (by C-G formula based on SCr of 1.7 mg/dL (H)).   Medical History: Past Medical History:  Diagnosis Date   Acute postoperative pain 02/09/2018   Arrhythmia, sinus node 04/03/2014   Arthritis    Arthritis    Atrial fibrillation (HCC)    Back pain    lower back chronic   Bradycardia    Breast cancer ([place]) 2004   right breast cancer   Cancer ([place]) 2004   rt breast cancer-post lumpectomy- chemo/rad   CHF (congestive heart failure) (HCC)    Chronic back pain    CKD (chronic kidney disease)    COPD (chronic obstructive pulmonary disease) ([place])    wears O2 at 2L via [place] at night   Cystocele    Decubitus ulcers    Dehydration    Gout    Hematuria    Hepatitis C    Hyperlipidemia    Hypertension    Hypomagnesemia 06/25/2015   OAB (overactive bladder)    Overactive bladder    Restless leg    Shoulder pain, bilateral    Urinary frequency    UTI (lower urinary tract infection)    Vaginal atrophy     Medications:  Apixaban 5 mg BID prior to admission (last patient reported dose 8/16 at 0800)  Assessment: Patient is an 87 y/o F with medical history as above and including Afib on apixaban who is admitted with weakness. Troponin 28 >> 26. Pharmacy consulted to initiate heparin infusion for suspected NSTEMI.  Baseline labs: aPTT  32, PT-INR 1.2, anti-Xa >1.10   Date Time HL aPTT Rate/comment  8/17 1137  110 Supratherapeutic, 750 un/hr  Goal of Therapy:  Heparin level 0.3-0.7 units/ml aPTT 66 - 102 seconds Monitor platelets by anticoagulation protocol: Yes   Plan:  aPTT 110, slightly supratherapeutic Decrease heparin infusion to 700 units/hr Check aPTT 8 hours after rate change. Follow aPTT for now given presumed interference of apixaban on anti-Xa levels. Can follow anti-Xa levels once correlation is established or if baseline anti-Xa level is low Monitor daily HL, CBC, s/s of bleed   Darnelle Bos, PharmD 04/08/2021,11:55 AM

## 2021-04-08 NOTE — Progress Notes (Signed)
[name], female   DOB: [birth year], 87 y.o.   MRN: [id number]   patient seen chart reviewed. Seen in the emergency room. Son at bedside. Patient apparently came in with weakness and low blood pressure. She told me her systolic blood pressure was in the upper 70s. She had been feeling weak for last couple days. Denies any fever or diarrhea vomiting. She was hypotensive received IV fluids in the emergency room. Her creatinine on admission was 2.33--- to 1.7  baseline creatinine around 1.4-- 1.5  patient had abnormal EKG noted in the ER. He she was evaluated by Dr. Nehemiah Massed. Patient does not have any chest pain. Started on IV heparin drip for minimal elevated troponin. Heparin drip discontinued after discussion with Dr. Nehemiah Massed. Resume back on renal dose of eliquis.  patient overall feeling better today. PT will see her for gait ambulation and safety. If overall she improves hopefully be discharged tomorrow.  patient and son agreeable.

## 2021-04-08 NOTE — ED Notes (Signed)
Lab called regarding 5 am labs, state they have them

## 2021-04-08 NOTE — ED Notes (Signed)
Pt set up with breakfast tray

## 2021-04-08 NOTE — ED Notes (Signed)
Patient provided sandwich box and sprite per MD ok

## 2021-04-08 NOTE — H&P (Signed)
History and Physical    Kristin [name] RSW:[id number] DOB: July 31, 1930 DOA: 04/07/2021  PCP: Olin Hauser, DO  Patient coming from: Home  I have personally briefly reviewed patient's old medical records in [place]  Chief Complaint:   HPI: Kristin [name] is a 87 y.o. female with medical history significant for remote history of breast cancer s/p right lumpectomy, chronic hypoxic respiratory failure on oxygen, dual-chamber pacemaker for sinus bradycardia, emphysema, chronic diastolic CHF, CKD stage III, hypertension, atrial fibrillation/flutter on Eliquis, chronic pain syndrome, PVD s/p external iliac artery stent, who presents with weakness and leg pain.  For the past 2 days she has felt generalized weakness.  Had new bilateral leg and feet pain.  Also has back pain but states that is chronic.  Feels like she can barely walk around.  Normally ambulates with a cane.  Usually can cook the kitchen and bake take.  However 2 to 3 days ago she did go out to pick apples but did not feel it was too strenuous for her..  She recently had bilateral lumbar facet block injection on 8/2 with pain management but felt minimal relief.  Denies any saddle anesthesia.  She also was diagnosed with UTI on 8/11 and started on a course of Augmentin.  Urine culture returned with mixed flora.  Denies any current dysuria but notes that she has to get up to use the bathroom often but this has been chronic. For the past 2 days she also noticed hypotension at home with blood pressure systolic in the 09F.  She has felt on and off left-sided chest pain in the past 2 days both at rest and with exertion.  Pain would last for minutes and will improved but never quite go away.  Thinks that maybe her breathing has gotten worse but hard to tell since is chronic.  She is chronically on 2 L and did not have to increase it at home recently.  Denies any nausea, vomiting or diarrhea.   In the ED, she was afebrile,  initially hypotensive down to 88/72 with improvement following IV fluid. CBC unremarkable.  Sodium of 134, potassium 5.3, creatinine of 2.22 from a prior of 1.4, BUN of 71  Troponin of 28 and then 26. EKG on my review with new T wave inversion on V4 and V5 compare to prior.    Review of Systems: Constitutional: No Weight Change, No Fever ENT/Mouth: No sore throat, No Rhinorrhea Eyes: No Eye Pain, No Vision Changes Cardiovascular: + Chest Pain, + SOB, No PND, No Dyspnea on Exertion, No Orthopnea, No Claudication, No Edema, No Palpitations Respiratory: No Cough, No Sputum Gastrointestinal: No Nausea, No Vomiting, No Diarrhea, No Constipation, No Pain Genitourinary: no Urinary Incontinence, No Urgency, No Flank Pain Musculoskeletal: No Arthralgias, No Myalgias Skin: No Skin Lesions, No Pruritus, Neuro: + Weakness, No Numbness Psych: No Anxiety/Panic, No Depression, no decrease appetite Heme/Lymph: No Bruising, No Bleeding  Past Medical History:  Diagnosis Date   Acute postoperative pain 02/09/2018   Arrhythmia, sinus node 04/03/2014   Arthritis    Arthritis    Atrial fibrillation (HCC)    Back pain    lower back chronic   Bradycardia    Breast cancer ([place]) 2004   right breast cancer   Cancer ([place]) 2004   rt breast cancer-post lumpectomy- chemo/rad   CHF (congestive heart failure) (HCC)    Chronic back pain    CKD (chronic kidney disease)    COPD (chronic obstructive pulmonary  disease) ([place])    wears O2 at 2L via [place] at night   Cystocele    Decubitus ulcers    Dehydration    Gout    Hematuria    Hepatitis C    Hyperlipidemia    Hypertension    Hypomagnesemia 06/25/2015   OAB (overactive bladder)    Overactive bladder    Restless leg    Shoulder pain, bilateral    Urinary frequency    UTI (lower urinary tract infection)    Vaginal atrophy     Past Surgical History:  Procedure Laterality Date   ABDOMINAL HYSTERECTOMY     BACK SURGERY     BREAST LUMPECTOMY Right 2004    BREAST SURGERY     rt lumpectomy   CARPAL TUNNEL RELEASE     CATARACT EXTRACTION W/PHACO  10/19/2011   Procedure: CATARACT EXTRACTION PHACO AND INTRAOCULAR LENS PLACEMENT ([place]);  Surgeon: Elta Guadeloupe T. Gershon Crane, MD;  Location: [facility];  Service: Ophthalmology;  Laterality: Right;  CDE:10.81   CATARACT EXTRACTION W/PHACO  11/02/2011   Procedure: CATARACT EXTRACTION PHACO AND INTRAOCULAR LENS PLACEMENT ([facility]);  Surgeon: Elta Guadeloupe T. Gershon Crane, MD;  Location: [facility];  Service: Ophthalmology;  Laterality: Left;  CDE 8.60   CHOLECYSTECTOMY     3/18   COLON SURGERY     INSERT / REPLACE / REMOVE PACEMAKER     JOINT REPLACEMENT     bilateral TKA   LOWER EXTREMITY ANGIOGRAPHY Left 10/14/2020   Procedure: LOWER EXTREMITY ANGIOGRAPHY;  Surgeon: Katha Cabal, MD;  Location: [facility];  Service: Cardiovascular;  Laterality: Left;   PACEMAKER INSERTION N/A 12/24/2015   Procedure: INSERTION PACEMAKER;  Surgeon: Isaias Cowman, MD;  Location: [facility];  Service: Cardiovascular;  Laterality: N/A;   ROTATOR CUFF REPAIR     bilateral     reports that she quit smoking about 22 years ago. Her smoking use included cigarettes. She has a 50.00 pack-year smoking history. She has quit using smokeless tobacco. She reports current drug use. Drug: Oxycodone. She reports that she does not drink alcohol. Social History  Allergies  Allergen Reactions   Flexeril [Cyclobenzaprine Hcl]     Severe Rash   Vancomycin Rash    [place] Syndrome    Family History  Problem Relation Age of Onset   Kidney disease Brother    Heart disease Father    Heart disease Mother    Stroke Mother    Cancer Sister    Breast cancer Sister 41   Breast cancer Paternal Aunt    Anesthesia problems Neg Hx    Hypotension Neg Hx    Malignant hyperthermia Neg Hx    Pseudochol deficiency Neg Hx      Prior to Admission medications   Medication Sig Start Date End Date Taking? Authorizing Provider  acetaminophen (TYLENOL) 500 MG  tablet Take 500 mg by mouth in the morning and at bedtime.   Yes [provider]  albuterol (PROVENTIL) (2.5 MG/3ML) 0.083% nebulizer solution Take 3 mLs (2.5 mg total) by nebulization every 8 (eight) hours as needed for wheezing or shortness of breath. 10/02/19 04/07/21 Yes Karamalegos, Devonne Doughty, DO  amoxicillin-clavulanate (AUGMENTIN) 875-125 MG tablet Take 1 tablet by mouth 2 (two) times daily. For 7 days 04/02/21  Yes Karamalegos, Devonne Doughty, DO  apixaban (ELIQUIS) 5 MG TABS tablet Take 1 tablet (5 mg total) by mouth 2 (two) times daily. 10/30/15  Yes Loletha Grayer, MD  aspirin EC 81 MG tablet Take 1  tablet (81 mg total) by mouth daily. Swallow whole. 10/14/20 10/14/21 Yes Schnier, Dolores Lory, MD  atorvastatin (LIPITOR) 20 MG tablet TAKE 1 TABLET BY MOUTH ONCE DAILY FOR CHOLESTEROL 02/17/21  Yes Karamalegos, Devonne Doughty, DO  bisacodyl (DULCOLAX) 5 MG EC tablet Take 2 tablets (10 mg total) by mouth daily as needed for moderate constipation. 07/04/20  Yes Karamalegos, Devonne Doughty, DO  Cholecalciferol (VITAMIN D) 2000 units tablet Take 2,000 Units by mouth daily.    Yes [provider]  furosemide (LASIX) 40 MG tablet TAKE (1) TABLET BY MOUTH ONCE DAILY AS NEEDED. Patient taking differently: Take 20 mg by mouth daily. 09/30/20  Yes Kathrine Haddock, NP  gabapentin (NEURONTIN) 100 MG capsule Take 3 capsules (300 mg total) by mouth at bedtime. 04/02/21  Yes Karamalegos, Devonne Doughty, DO  levocetirizine (XYZAL) 5 MG tablet TAKE (1/2) TABLET BY MOUTH EVERY OTHER DAY. 02/13/21  Yes Karamalegos, Devonne Doughty, DO  lidocaine (XYLOCAINE) 5 % ointment Apply 1 application topically daily as needed. 12/24/20  Yes Kris Hartmann, NP  lisinopril (ZESTRIL) 10 MG tablet Take 10 mg by mouth daily.   Yes [provider]  Melatonin 10 MG TABS Take 10 mg by mouth at bedtime.   Yes [provider]  oxyCODONE 10 MG TABS Take 1 tablet (10 mg total) by mouth in the morning and at bedtime. Must last  30 days. 04/03/21 05/03/21 Yes Milinda Pointer, MD  OXYGEN Inhale 2 L into the lungs at bedtime.    Yes [provider]  rOPINIRole (REQUIP) 2 MG tablet TAKE 2 TABLETS BY MOUTH AT BEDTIME 01/28/21  Yes Karamalegos, Devonne Doughty, DO  solifenacin (VESICARE) 5 MG tablet Take 1 tablet by mouth daily. 03/08/16  Yes [provider]  Tiotropium Bromide-Olodaterol (STIOLTO RESPIMAT) 2.5-2.5 MCG/ACT AERS Inhale into the lungs. 12/10/20  Yes [provider]  umeclidinium-vilanterol (ANORO ELLIPTA) 62.5-25 MCG/INH AEPB Inhale 1 puff into the lungs daily. 12/03/20  Yes [provider]  oxyCODONE 10 MG TABS Take 1 tablet (10 mg total) by mouth in the morning and at bedtime. Must last 30 days. 03/04/21 04/03/21  Milinda Pointer, MD  oxyCODONE 10 MG TABS Take 1 tablet (10 mg total) by mouth in the morning and at bedtime. Must last 30 days. 05/03/21 06/02/21  Milinda Pointer, MD  Wheat Dextrin (BENEFIBER) POWD Take 6 g by mouth 3 (three) times daily before meals. (2 tsp = 6 g) Patient not taking: Reported on 01/27/2021 08/01/19 07/31/20  Milinda Pointer, MD    Physical Exam: Vitals:   04/07/21 2254 04/07/21 2300 04/07/21 2330 04/08/21 0000  BP: 117/66 115/69 112/63 (!) 142/96  Pulse:  61 60 71  Resp:  19 17 18   Temp:      TempSrc:      SpO2:  97% 98% 96%  Weight:      Height:        Constitutional: NAD, calm, comfortable, thin elderly female laying at approximately 40 degree incline in bed and watching TV  Vitals:   04/07/21 2254 04/07/21 2300 04/07/21 2330 04/08/21 0000  BP: 117/66 115/69 112/63 (!) 142/96  Pulse:  61 60 71  Resp:  19 17 18   Temp:      TempSrc:      SpO2:  97% 98% 96%  Weight:      Height:       Eyes: PERRL, lids and conjunctivae normal ENMT: Mucous membranes are moist.  Neck: normal, supple Respiratory: clear to auscultation bilaterally, no  wheezing, no crackles. Normal respiratory effort. No accessory muscle use.  Cardiovascular: Regular  rate and rhythm, no murmurs / rubs / gallops. No extremity edema. 2+ pedal pulses.  Abdomen: no tenderness, no masses palpated. Bowel sounds positive.  Musculoskeletal: no clubbing / cyanosis. No joint deformity upper and lower extremities. Good ROM, no contractures. Normal muscle tone.  Skin: superficial healing wound on left lateral malleolus Neurologic: CN 2-12 grossly intact. Sensation intact, Strength 5/5 in all 4.  Psychiatric: Normal judgment and insight. Alert and oriented x 3. Normal mood.    Labs on Admission: I have personally reviewed following labs and imaging studies  CBC: Recent Labs  Lab 04/02/21 0916 04/07/21 1923  WBC 9.9 9.6  NEUTROABS 6,069  --   HGB 13.2 12.6  HCT 41.8 39.3  MCV 94.8 97.5  PLT 184 809   Basic Metabolic Panel: Recent Labs  Lab 04/02/21 0916 04/07/21 1923  NA 137 134*  K 5.5* 5.3*  CL 107 106  CO2 23 18*  GLUCOSE 86 90  BUN 56* 71*  CREATININE 1.40* 2.33*  CALCIUM 10.2 9.7   GFR: Estimated Creatinine Clearance: 15.9 mL/min (A) (by C-G formula based on SCr of 2.33 mg/dL (H)). Liver Function Tests: No results for input(s): AST, ALT, ALKPHOS, BILITOT, PROT, ALBUMIN in the last 168 hours. No results for input(s): LIPASE, AMYLASE in the last 168 hours. No results for input(s): AMMONIA in the last 168 hours. Coagulation Profile: No results for input(s): INR, PROTIME in the last 168 hours. Cardiac Enzymes: No results for input(s): CKTOTAL, CKMB, CKMBINDEX, TROPONINI in the last 168 hours. BNP (last 3 results) No results for input(s): PROBNP in the last 8760 hours. HbA1C: No results for input(s): HGBA1C in the last 72 hours. CBG: No results for input(s): GLUCAP in the last 168 hours. Lipid Profile: No results for input(s): CHOL, HDL, LDLCALC, TRIG, CHOLHDL, LDLDIRECT in the last 72 hours. Thyroid Function Tests: No results for input(s): TSH, T4TOTAL, FREET4, T3FREE, THYROIDAB in the last 72 hours. Anemia Panel: No results for  input(s): VITAMINB12, FOLATE, FERRITIN, TIBC, IRON, RETICCTPCT in the last 72 hours. Urine analysis:    Component Value Date/Time   COLORURINE YELLOW 11/07/2020 2121   APPEARANCEUR CLEAR 11/07/2020 2121   APPEARANCEUR Clear 09/03/2015 1036   LABSPEC 1.015 11/07/2020 2121   PHURINE 5.0 11/07/2020 2121   GLUCOSEU NEGATIVE 11/07/2020 2121   HGBUR NEGATIVE 11/07/2020 2121   BILIRUBINUR Negative 04/02/2021 0908   BILIRUBINUR Negative 09/03/2015 1036   KETONESUR NEGATIVE 11/07/2020 2121   PROTEINUR Positive (A) 04/02/2021 0908   PROTEINUR NEGATIVE 11/07/2020 2121   UROBILINOGEN 0.2 04/02/2021 0908   UROBILINOGEN 0.2 02/17/2011 1050   NITRITE Negative 04/02/2021 0908   NITRITE NEGATIVE 11/07/2020 2121   LEUKOCYTESUR Trace (A) 04/02/2021 0908   LEUKOCYTESUR NEGATIVE 11/07/2020 2121    Radiological Exams on Admission: DG Chest 2 View  Result Date: 04/07/2021 CLINICAL DATA:  Weakness EXAM: CHEST - 2 VIEW COMPARISON:  11/07/2020 FINDINGS: Cardiac shadow is enlarged but stable. Aortic calcifications are noted. Pacing device is again seen and stable. The lungs are well aerated without focal infiltrate or sizable effusion. Postsurgical changes in the shoulders are seen. IMPRESSION: No acute abnormality noted. Electronically Signed   By: [name] M.D.   On: 04/07/2021 20:04      Assessment/Plan  Suspected NSTEMI -Pt with history of dual-chamber pacemaker, chronic diastolic CHF, atrial fibrillation/flutter who presented with persistent chest pain and hypotension.  Troponin is reassuring as is downtrending from 28-26  however there is notable new EKG changes with new T wave inversion on V4 and V5.  EKG changes could be caused by hypotension but given her age and cardiac risk factors still concerned about ACS. -Continue to trend troponin -Start IV heparin infusion -Obtain echocardiogram -Consult cardiology in the morning  Hypotension -Secondary to hypovolemia versus ischemia or possible  polypharmacy with opioids, Lasix  -resolved with IV fluid -hold antihypertensives   hx of sinus bradycardia with pacemaker -will have pacemaker interrogated   AKI on CKD stage 3 -creatinine of 2.22 in admit from 1.4  -monitor with repeat labs with continuous IV fluid - Avoid nephrotoxic agent  Chronic diastolic HF -appears euvolemic   Hx of a.fib/flutter -hold Eliquis while on heparin infusion  Back pain with history of lumbar facet syndrome -Recently received bilateral lumbar facet block on 8/2 with minimal relief -Check lumbar x-ray -lower dose of home oxycodone from 10mg  to 5mg  due to hypotension  Lower extremity pain/hx of PVD -Patient with history of peripheral vascular disease with history of stenting to the external iliac artery -she follows with vascular outpatient and recent ABI of right LE at 0.91 on 03/06/21. ABI of left LE could not be obtain due to wound dressing at that time but previously had ABI of 0.81 -continues to be on Eliquis and aspirin  -Unclear source of pain but likely due to her PVD and worsening perfusion due to hypotension  DVT prophylaxis:heparin infusion Code Status: Full Family Communication: Plan discussed with patient at bedside  disposition Plan: Home with observation Consults called:  Admission status: Observation  Level of care: Progressive Cardiac  Status is: Observation  The patient remains OBS appropriate and will d/c before 2 midnights.  Dispo: The patient is from: Home              Anticipated d/c is to: Home              Patient currently is not medically stable to d/c.   Difficult to place patient No         Orene Desanctis DO [facility]   If 7PM-7AM, please contact night-coverage [email or web address]   04/08/2021, 12:28 AM

## 2021-04-09 DIAGNOSIS — I480 Paroxysmal atrial fibrillation: Secondary | ICD-10-CM

## 2021-04-09 DIAGNOSIS — M545 Low back pain, unspecified: Secondary | ICD-10-CM | POA: Diagnosis not present

## 2021-04-09 DIAGNOSIS — N189 Chronic kidney disease, unspecified: Secondary | ICD-10-CM

## 2021-04-09 DIAGNOSIS — I5031 Acute diastolic (congestive) heart failure: Secondary | ICD-10-CM | POA: Diagnosis not present

## 2021-04-09 DIAGNOSIS — G8929 Other chronic pain: Secondary | ICD-10-CM

## 2021-04-09 DIAGNOSIS — I959 Hypotension, unspecified: Secondary | ICD-10-CM | POA: Diagnosis not present

## 2021-04-09 DIAGNOSIS — R778 Other specified abnormalities of plasma proteins: Secondary | ICD-10-CM

## 2021-04-09 DIAGNOSIS — N179 Acute kidney failure, unspecified: Secondary | ICD-10-CM | POA: Diagnosis not present

## 2021-04-09 DIAGNOSIS — I739 Peripheral vascular disease, unspecified: Secondary | ICD-10-CM | POA: Diagnosis not present

## 2021-04-09 MED ORDER — APIXABAN 2.5 MG PO TABS
2.5000 mg | ORAL_TABLET | Freq: Two times a day (BID) | ORAL | 0 refills | Status: DC
Start: 2021-04-09 — End: 2022-01-31

## 2021-04-09 NOTE — Evaluation (Signed)
Physical Therapy Evaluation Patient Details Name: Kristin [name] MRN: [id number] DOB: Jan 10, 1930 Today's Date: 04/09/2021   History of Present Illness  Patient is a 87 y.o. female with medical history significant for remote history of breast cancer s/p right lumpectomy, chronic hypoxic respiratory failure on oxygen, dual-chamber pacemaker for sinus bradycardia, emphysema, chronic diastolic CHF, CKD stage III, hypertension, atrial fibrillation/flutter on Eliquis, chronic pain syndrome, PVD s/p external iliac artery stent, who presents with weakness and leg pain.  Clinical Impression  Patient agreeable to PT. She reports she lives with her son and is independent with ambulation with intermittent use of cane at baseline.  Patient is demonstrating high level of functional mobility. She ambulated 131ft with hand held assistance and no loss of balance. Patient is mildly fatigued after walking with Sp02 91% on room air after walking. Patient educated on energy conservation techniques, using DME for ambulation for safety/fall prevention at home, and ways to reduce fall risk in her own home setting.  No further PT needs at this time as patient is likely nearing her baseline level of functional mobility. PT eval/treatment and education completed today.     Follow Up Recommendations No PT follow up    Equipment Recommendations  None recommended by PT    Recommendations for Other Services       Precautions / Restrictions Precautions Precautions: Fall Restrictions Weight Bearing Restrictions: No      Mobility  Bed Mobility Overal bed mobility: Modified Independent                  Transfers Overall transfer level: Modified independent Equipment used: None                Ambulation/Gait Ambulation/Gait assistance: Min guard Gait Distance (Feet): 120 Feet Assistive device: 1 person hand held assist Gait Pattern/deviations: Step-through pattern Gait velocity: decreased    General Gait Details: no loss of balance with ambulation. patient declined need for rolling walker and used HHA to simulate single point cane use that patient uses intermittently at home. recommended to use cane for safety with ambulation and decrease fall risk at home  Stairs            Wheelchair Mobility    Modified Rankin (Stroke Patients Only)       Balance Overall balance assessment: Needs assistance Sitting-balance support: Feet supported Sitting balance-Leahy Scale: Good     Standing balance support: Single extremity supported Standing balance-Leahy Scale: Good                               Pertinent Vitals/Pain Pain Assessment: No/denies pain    Home Living Family/patient expects to be discharged to:: Private residence Living Arrangements: Children Available Help at Discharge: Family;Available PRN/intermittently Type of Home: House Home Access: Level entry     Home Layout: Laundry or work area in basement;Able to live on main level with bedroom/bathroom Home Equipment: Bedside commode;Walker - 4 wheels;Cane - single point      Prior Function Level of Independence: Independent with assistive device(s)         Comments: intermittent use of cane for ambulation, independent with all ADLs     Hand Dominance   Dominant Hand: Right    Extremity/Trunk Assessment   Upper Extremity Assessment Upper Extremity Assessment: Overall WFL for tasks assessed    Lower Extremity Assessment Lower Extremity Assessment: Overall WFL for tasks assessed       Communication  Communication: No difficulties  Cognition Arousal/Alertness: Awake/alert Behavior During Therapy: WFL for tasks assessed/performed Overall Cognitive Status: Within Functional Limits for tasks assessed                                        General Comments General comments (skin integrity, edema, etc.): patient educated on energy conservation techniques,  preventing falls at home including use of DME with ambulation, removing tripping hazards and using night light at night for walking to bathroom. patient verbalized understanding    Exercises     Assessment/Plan    PT Assessment Patent does not need any further PT services  PT Problem List         PT Treatment Interventions      PT Goals (Current goals can be found in the Care Plan section)  Acute Rehab PT Goals Patient Stated Goal: to go home today PT Goal Formulation: With patient Time For Goal Achievement: 04/09/21 Potential to Achieve Goals: Good    Frequency     Barriers to discharge        Co-evaluation               AM-PAC PT "6 Clicks" Mobility  Outcome Measure Help needed turning from your back to your side while in a flat bed without using bedrails?: None Help needed moving from lying on your back to sitting on the side of a flat bed without using bedrails?: None Help needed moving to and from a bed to a chair (including a wheelchair)?: A Little Help needed standing up from a chair using your arms (e.g., wheelchair or bedside chair)?: A Little Help needed to walk in hospital room?: A Little Help needed climbing 3-5 steps with a railing? : A Little 6 Click Score: 20    End of Session   Activity Tolerance: Patient tolerated treatment well Patient left: in bed;with call bell/phone within reach;with bed alarm set   PT Visit Diagnosis: Muscle weakness (generalized) (M62.81)    Time: 9476-5465 PT Time Calculation (min) (ACUTE ONLY): 20 min   Charges:   PT Evaluation $PT Eval Low Complexity: 1 Low PT Treatments $Therapeutic Activity: 8-22 mins       Minna Merritts, PT, MPT  Percell Locus 04/09/2021, 11:15 AM

## 2021-04-09 NOTE — Plan of Care (Signed)
  Problem: Education: Goal: Knowledge of General Education information will improve Description Including pain rating scale, medication(s)/side effects and non-pharmacologic comfort measures Outcome: Progressing   Problem: Clinical Measurements: Goal: Ability to maintain clinical measurements within normal limits will improve Outcome: Progressing   Problem: Clinical Measurements: Goal: Will remain free from infection Outcome: Progressing   

## 2021-04-09 NOTE — Progress Notes (Signed)
[facility] Cardiology  SUBJECTIVE: Patient laying in bed, reports feeling better, less weakness and leg pain, denies chest pain or shortness of breath   Vitals:   04/09/21 0052 04/09/21 0417 04/09/21 0533 04/09/21 0815  BP: (!) 120/59 (!) 85/58 108/66 103/80  Pulse: (!) 58 (!) 59 62 62  Resp:   16 16  Temp:   97.6 F (36.4 C) 98.2 F (36.8 C)  TempSrc:    Oral  SpO2: 100% 95% 98% 96%  Weight:      Height:         Intake/Output Summary (Last 24 hours) at 04/09/2021 0933 Last data filed at 04/08/2021 1342 Gross per 24 hour  Intake 124.84 ml  Output --  Net 124.84 ml      PHYSICAL EXAM  General: Well developed, well nourished, in no acute distress HEENT:  Normocephalic and atramatic Neck:  No JVD.  Lungs: Clear bilaterally to auscultation and percussion. Heart: HRRR . Normal S1 and S2 without gallops or murmurs.  Abdomen: Bowel sounds are positive, abdomen soft and non-tender  Msk:  Back normal, normal gait. Normal strength and tone for age. Extremities: No clubbing, cyanosis or edema.   Neuro: Alert and oriented X 3. Psych:  Good affect, responds appropriately   LABS: Basic Metabolic Panel: Recent Labs    04/07/21 1923 04/08/21 0805  NA 134* 138  K 5.3* 5.0  CL 106 112*  CO2 18* 19*  GLUCOSE 90 73  BUN 71* 60*  CREATININE 2.33* 1.70*  CALCIUM 9.7 9.3   Liver Function Tests: No results for input(s): AST, ALT, ALKPHOS, BILITOT, PROT, ALBUMIN in the last 72 hours. No results for input(s): LIPASE, AMYLASE in the last 72 hours. CBC: Recent Labs    04/07/21 1923 04/08/21 0805  WBC 9.6 9.5  HGB 12.6 12.8  HCT 39.3 40.2  MCV 97.5 96.9  PLT 152 138*   Cardiac Enzymes: No results for input(s): CKTOTAL, CKMB, CKMBINDEX, TROPONINI in the last 72 hours. BNP: Invalid input(s): POCBNP D-Dimer: No results for input(s): DDIMER in the last 72 hours. Hemoglobin A1C: No results for input(s): HGBA1C in the last 72 hours. Fasting Lipid Panel: No results for input(s):  CHOL, HDL, LDLCALC, TRIG, CHOLHDL, LDLDIRECT in the last 72 hours. Thyroid Function Tests: No results for input(s): TSH, T4TOTAL, T3FREE, THYROIDAB in the last 72 hours.  Invalid input(s): FREET3 Anemia Panel: No results for input(s): VITAMINB12, FOLATE, FERRITIN, TIBC, IRON, RETICCTPCT in the last 72 hours.  DG Chest 2 View  Result Date: 04/07/2021 CLINICAL DATA:  Weakness EXAM: CHEST - 2 VIEW COMPARISON:  11/07/2020 FINDINGS: Cardiac shadow is enlarged but stable. Aortic calcifications are noted. Pacing device is again seen and stable. The lungs are well aerated without focal infiltrate or sizable effusion. Postsurgical changes in the shoulders are seen. IMPRESSION: No acute abnormality noted. Electronically Signed   By: [name] M.D.   On: 04/07/2021 20:04   DG Lumbar Spine 2-3 Views  Result Date: 04/08/2021 CLINICAL DATA:  Low back pain. EXAM: LUMBAR SPINE - 2-3 VIEW COMPARISON:  February 25, 2018 FINDINGS: There is no evidence of acute lumbar spine fracture. There is moderate to marked severity levoscoliosis. Marked severity multilevel endplate sclerosis is seen with marked severity multilevel intervertebral disc space narrowing. IMPRESSION: 1. Moderate to marked severity levoscoliosis with marked severity multilevel degenerative changes. Electronically Signed   By: Virgina Norfolk M.D.   On: 04/08/2021 01:29     Echo   TELEMETRY: Atrial pacing ventricular pacing:  ASSESSMENT  AND PLAN:  Principal Problem:   NSTEMI (non-ST elevated myocardial infarction) ([place]) Active Problems:   Lumbar facet syndrome (Bilateral) (L>R)   Chronic diastolic CHF (congestive heart failure) (HCC)   Cardiac pacemaker in situ   Acute-on-chronic kidney injury ([place])   Hypotension   History of atrial fibrillation   PVD (peripheral vascular disease) ([place])    1.  Borderline elevated high-sensitivity troponin (26, 24, 27) without delta, in the absence of chest pain or new ECG changes, likely myocardial  injury in the absence of ischemia ( I5A ) 2.  Weakness, likely secondary to low blood pressure, due to overdiuresis 3.  Acute kidney injury, with underlying chronic kidney disease stage III, initial BUN and creatinine 71 and 2.33, respectively, improved after hydration 4.  Chronic diastolic congestive heart failure, not fluid overloaded 5.  Sinus sinus syndrome status post dual-chamber pacemaker 12/24/2015 6.  Paroxysmal atrial fibrillation, CHA2DS2-VASc score 4, on Eliquis for stroke prevention  Recommendations  1.  Agree with overall current therapy 2.  Continue low-dose Eliquis for stroke prevention 3.  Hold diuretics for now 4.  Defer further cardiac diagnostics at this time 5.  Follow-up with me in 1 to 2 weeks  Sign off for now, please call with any questions   Isaias Cowman, MD, PhD, [facility] 04/09/2021 9:33 AM

## 2021-04-09 NOTE — Progress Notes (Signed)
Discharge instructions explained/pt verbalized understanding. IV and tele removed.  Will transport off unit via wheelchair, when ride arrives.

## 2021-04-09 NOTE — Discharge Summary (Signed)
[place] [facility] [place] NAME: Kristin [name]    MR#:  [id number]  DATE OF BIRTH:  [birth year]  DATE OF ADMISSION:  04/07/2021 ADMITTING PHYSICIAN: Orene Desanctis, DO  DATE OF DISCHARGE: 04/09/2021 11:01 AM  PRIMARY CARE PHYSICIAN: Olin Hauser, DO    ADMISSION DIAGNOSIS:  Back pain [M54.9] Weakness [R53.1] Abnormal EKG [R94.31] AKI (acute kidney injury) ([place]) [N17.9] Hypotension [I95.9] Hypotension, unspecified hypotension type [I95.9]  DISCHARGE DIAGNOSIS:  1.  Relative hypotension secondary to overdiuresis.  Her Lasix and lisinopril held.  Follow-up with PMD and cardiology as outpatient. 2.  Acute kidney injury on chronic kidney disease stage IIIa.  Baseline creatinine 1.4.  Patient came in with a creatinine of 2.33 and improved with IV fluids overnight to 1.7.  Patient felt well and wanted to go home.  Recommend a BMP and follow-up appointment.  Continue to hold diuretic and lisinopril. 3.  Borderline troponin.  NSTEMI ruled out 4.  Chronic back pain on oxycodone.  Careful with pain medications at home. 5.  Peripheral vascular disease on Eliquis and aspirin 6.  Paroxysmal atrial fibrillation on Eliquis.  Dose decreased down to 2.5 mg twice daily secondary to age and GFR.  SECONDARY DIAGNOSIS:   Past Medical History:  Diagnosis Date   Acute postoperative pain 02/09/2018   Arrhythmia, sinus node 04/03/2014   Arthritis    Arthritis    Atrial fibrillation (HCC)    Back pain    lower back chronic   Bradycardia    Breast cancer ([place]) 2004   right breast cancer   Cancer ([place]) 2004   rt breast cancer-post lumpectomy- chemo/rad   CHF (congestive heart failure) (HCC)    Chronic back pain    CKD (chronic kidney disease)    COPD (chronic obstructive pulmonary disease) ([place])    wears O2 at 2L via [place] at night   Cystocele    Decubitus ulcers    Dehydration    Gout    Hematuria    Hepatitis C    Hyperlipidemia    Hypertension     Hypomagnesemia 06/25/2015   OAB (overactive bladder)    Overactive bladder    Restless leg    Shoulder pain, bilateral    Urinary frequency    UTI (lower urinary tract infection)    Vaginal atrophy     HOSPITAL COURSE:   1.  Relative hypotension secondary to overdiuresis.  Her Lasix and lisinopril held.  Follow-up with PMD and cardiology as outpatient. 2.  Acute kidney injury on chronic kidney disease stage IIIa.  Baseline creatinine 1.4.  Patient came in with a creatinine of 2.33 and improved with IV fluids overnight to 1.7.  Patient felt well and wanted to go home.  Recommend a BMP and follow-up appointment.  Continue to hold diuretic and lisinopril. 3.  Borderline troponin.  NSTEMI ruled out 4.  Chronic back pain on oxycodone.  Careful with pain medications at home. 5.  Peripheral vascular disease on Eliquis and aspirin 6.  Paroxysmal atrial fibrillation on Eliquis.  Dose decreased down to 2.5 mg twice daily secondary to age and GFR.    DISCHARGE CONDITIONS:   Satisfactory  CONSULTS OBTAINED:  Treatment Team:  Corey Skains, MD Isaias Cowman, MD  DRUG ALLERGIES:   Allergies  Allergen Reactions   Flexeril [Cyclobenzaprine Hcl]     Severe Rash   Vancomycin Rash    [place] Syndrome    DISCHARGE MEDICATIONS:   Allergies  as of 04/09/2021       Reactions   Flexeril [cyclobenzaprine Hcl]    Severe Rash   Vancomycin Rash   [place] Syndrome        Medication List     STOP taking these medications    amoxicillin-clavulanate 875-125 MG tablet Commonly known as: AUGMENTIN   Benefiber Powd   furosemide 40 MG tablet Commonly known as: LASIX   lidocaine 5 % ointment Commonly known as: XYLOCAINE   lisinopril 10 MG tablet Commonly known as: ZESTRIL   Stiolto Respimat 2.5-2.5 MCG/ACT Aers Generic drug: Tiotropium Bromide-Olodaterol       TAKE these medications    acetaminophen 500 MG tablet Commonly known as: TYLENOL Take 500 mg by mouth  in the morning and at bedtime.   albuterol (2.5 MG/3ML) 0.083% nebulizer solution Commonly known as: PROVENTIL Take 3 mLs (2.5 mg total) by nebulization every 8 (eight) hours as needed for wheezing or shortness of breath.   Anoro Ellipta 62.5-25 MCG/INH Aepb Generic drug: umeclidinium-vilanterol Inhale 1 puff into the lungs daily.   apixaban 2.5 MG Tabs tablet Commonly known as: ELIQUIS Take 1 tablet (2.5 mg total) by mouth 2 (two) times daily. What changed:  medication strength how much to take   aspirin EC 81 MG tablet Take 1 tablet (81 mg total) by mouth daily. Swallow whole.   atorvastatin 20 MG tablet Commonly known as: LIPITOR TAKE 1 TABLET BY MOUTH ONCE DAILY FOR CHOLESTEROL   bisacodyl 5 MG EC tablet Commonly known as: DULCOLAX Take 2 tablets (10 mg total) by mouth daily as needed for moderate constipation.   gabapentin 100 MG capsule Commonly known as: NEURONTIN Take 3 capsules (300 mg total) by mouth at bedtime.   levocetirizine 5 MG tablet Commonly known as: XYZAL TAKE (1/2) TABLET BY MOUTH EVERY OTHER DAY.   Melatonin 10 MG Tabs Take 10 mg by mouth at bedtime.   Oxycodone HCl 10 MG Tabs Take 1 tablet (10 mg total) by mouth in the morning and at bedtime. Must last 30 days. What changed: Another medication with the same name was removed. Continue taking this medication, and follow the directions you see here.   OXYGEN Inhale 2 L into the lungs at bedtime.   rOPINIRole 2 MG tablet Commonly known as: REQUIP TAKE 2 TABLETS BY MOUTH AT BEDTIME   solifenacin 5 MG tablet Commonly known as: VESICARE Take 1 tablet by mouth daily.   Vitamin D 50 MCG (2000 UT) tablet Take 2,000 Units by mouth daily.         DISCHARGE INSTRUCTIONS:   Follow-up PMD 5 days Follow-up cardiology 1 week  If you experience worsening of your admission symptoms, develop shortness of breath, life threatening emergency, suicidal or homicidal thoughts you must seek medical  attention immediately by calling 911 or calling your MD immediately  if symptoms less severe.  You Must read complete instructions/literature along with all the possible adverse reactions/side effects for all the Medicines you take and that have been prescribed to you. Take any new Medicines after you have completely understood and accept all the possible adverse reactions/side effects.   Please note  You were cared for by a hospitalist during your hospital stay. If you have any questions about your discharge medications or the care you received while you were in the hospital after you are discharged, you can call the unit and asked to speak with the hospitalist on call if the hospitalist that took care of you is  not available. Once you are discharged, your primary care physician will handle any further medical issues. Please note that NO REFILLS for any discharge medications will be authorized once you are discharged, as it is imperative that you return to your primary care physician (or establish a relationship with a primary care physician if you do not have one) for your aftercare needs so that they can reassess your need for medications and monitor your lab values.    Today   CHIEF COMPLAINT:   Chief Complaint  Patient presents with   Weakness    HISTORY OF PRESENT ILLNESS:  Jakia Kennebrew  is a 87 y.o. female came in with weakness   VITAL SIGNS:  Blood pressure 103/80, pulse 62, temperature 98.2 F (36.8 C), temperature source Oral, resp. rate 16, height 5\' 7"  (1.702 m), weight 66.2 kg, SpO2 96 %.  I/O:   Intake/Output Summary (Last 24 hours) at 04/09/2021 1552 Last data filed at 04/09/2021 1030 Gross per 24 hour  Intake 120 ml  Output 450 ml  Net -330 ml    PHYSICAL EXAMINATION:  GENERAL:  87 y.o.-year-old patient lying in the bed with no acute distress.  EYES: Pupils equal, round, reactive to light and accommodation. No scleral icterus.  HEENT: Head atraumatic,  normocephalic. Oropharynx and nasopharynx clear.  LUNGS: Normal breath sounds bilaterally, no wheezing, rales,rhonchi or crepitation. No use of accessory muscles of respiration.  CARDIOVASCULAR: S1, S2 normal. No murmurs, rubs, or gallops.  ABDOMEN: Soft, non-tender, non-distended.  EXTREMITIES: No pedal edema.  NEUROLOGIC: Cranial nerves II through XII are intact. Muscle strength 5/5 in all extremities. Sensation intact. Gait not checked.  PSYCHIATRIC: The patient is alert and oriented x 3.  SKIN: No obvious rash, lesion, or ulcer.   DATA REVIEW:   CBC Recent Labs  Lab 04/08/21 0805  WBC 9.5  HGB 12.8  HCT 40.2  PLT 138*    Chemistries  Recent Labs  Lab 04/08/21 0805  NA 138  K 5.0  CL 112*  CO2 19*  GLUCOSE 73  BUN 60*  CREATININE 1.70*  CALCIUM 9.3     Microbiology Results  Results for orders placed or performed during the hospital encounter of 04/07/21  Resp Panel by RT-PCR (Flu A&B, Covid) Nasopharyngeal Swab     Status: None   Collection Time: 04/07/21 10:32 PM   Specimen: Nasopharyngeal Swab; Nasopharyngeal(NP) swabs in vial transport medium  Result Value Ref Range Status   SARS Coronavirus 2 by RT PCR NEGATIVE NEGATIVE Final    Comment: (NOTE) SARS-CoV-2 target nucleic acids are NOT DETECTED.  The SARS-CoV-2 RNA is generally detectable in upper respiratory specimens during the acute phase of infection. The lowest concentration of SARS-CoV-2 viral copies this assay can detect is 138 copies/mL. A negative result does not preclude SARS-Cov-2 infection and should not be used as the sole basis for treatment or other patient management decisions. A negative result may occur with  improper specimen collection/handling, submission of specimen other than nasopharyngeal swab, presence of viral mutation(s) within the areas targeted by this assay, and inadequate number of viral copies(<138 copies/mL). A negative result must be combined with clinical observations,  patient history, and epidemiological information. The expected result is Negative.  Fact Sheet for Patients:  EntrepreneurPulse.com.au  Fact Sheet for Healthcare Providers:  IncredibleEmployment.be  This test is no t yet approved or cleared by the [place] FDA and  has been authorized for detection and/or diagnosis of SARS-CoV-2 by FDA under an Emergency Use Authorization (  EUA). This EUA will remain  in effect (meaning this test can be used) for the duration of the COVID-19 declaration under Section 564(b)(1) of the Act, 21 U.S.C.section 360bbb-3(b)(1), unless the authorization is terminated  or revoked sooner.       Influenza A by PCR NEGATIVE NEGATIVE Final   Influenza B by PCR NEGATIVE NEGATIVE Final    Comment: (NOTE) The Xpert Xpress SARS-CoV-2/FLU/RSV plus assay is intended as an aid in the diagnosis of influenza from Nasopharyngeal swab specimens and should not be used as a sole basis for treatment. Nasal washings and aspirates are unacceptable for Xpert Xpress SARS-CoV-2/FLU/RSV testing.  Fact Sheet for Patients: EntrepreneurPulse.com.au  Fact Sheet for Healthcare Providers: IncredibleEmployment.be  This test is not yet approved or cleared by the [place] FDA and has been authorized for detection and/or diagnosis of SARS-CoV-2 by FDA under an Emergency Use Authorization (EUA). This EUA will remain in effect (meaning this test can be used) for the duration of the COVID-19 declaration under Section 564(b)(1) of the Act, 21 U.S.C. section 360bbb-3(b)(1), unless the authorization is terminated or revoked.  Performed at [facility], [address] [place], [place] [address]     RADIOLOGY:  DG Chest 2 View  Result Date: 04/07/2021 CLINICAL DATA:  Weakness EXAM: CHEST - 2 VIEW COMPARISON:  11/07/2020 FINDINGS: Cardiac shadow is enlarged but stable. Aortic calcifications are  noted. Pacing device is again seen and stable. The lungs are well aerated without focal infiltrate or sizable effusion. Postsurgical changes in the shoulders are seen. IMPRESSION: No acute abnormality noted. Electronically Signed   By: [name] M.D.   On: 04/07/2021 20:04   DG Lumbar Spine 2-3 Views  Result Date: 04/08/2021 CLINICAL DATA:  Low back pain. EXAM: LUMBAR SPINE - 2-3 VIEW COMPARISON:  February 25, 2018 FINDINGS: There is no evidence of acute lumbar spine fracture. There is moderate to marked severity levoscoliosis. Marked severity multilevel endplate sclerosis is seen with marked severity multilevel intervertebral disc space narrowing. IMPRESSION: 1. Moderate to marked severity levoscoliosis with marked severity multilevel degenerative changes. Electronically Signed   By: Virgina Norfolk M.D.   On: 04/08/2021 01:29     Management plans discussed with the patient, family and they are in agreement.  CODE STATUS:     Code Status Orders  (From admission, onward)           Start     Ordered   04/08/21 0026  Full code  Continuous        04/08/21 0025           Code Status History     Date Active Date Inactive Code Status Order ID Comments User Context   10/14/2020 1340 10/14/2020 2007 Full Code [id number]  Katha Cabal, MD Inpatient   08/03/2020 0046 08/05/2020 1638 Full Code [id number]  Rolla Plate, DO Inpatient   05/24/2020 0350 05/25/2020 1944 Full Code [id number]  Rolla Plate, DO ED   08/14/2017 1649 08/15/2017 1932 Full Code [id number]  Nicholes Mango, MD Inpatient   12/24/2015 1522 12/25/2015 1349 Full Code [id number]  Isaias Cowman, MD Inpatient   10/29/2015 0235 10/30/2015 1923 Full Code [id number]  Saundra Shelling, MD ED       TOTAL TIME TAKING CARE OF THIS PATIENT: 35 minutes.    Loletha Grayer M.D on 04/09/2021 at 3:52 PM  [facility]  CC: Primary care physician; Olin Hauser, DO

## 2021-04-10 DIAGNOSIS — J45998 Other asthma: Secondary | ICD-10-CM | POA: Diagnosis not present

## 2021-04-12 DIAGNOSIS — J45998 Other asthma: Secondary | ICD-10-CM | POA: Diagnosis not present

## 2021-04-16 DIAGNOSIS — I495 Sick sinus syndrome: Secondary | ICD-10-CM | POA: Diagnosis not present

## 2021-04-16 DIAGNOSIS — I5032 Chronic diastolic (congestive) heart failure: Secondary | ICD-10-CM | POA: Diagnosis not present

## 2021-04-16 DIAGNOSIS — I480 Paroxysmal atrial fibrillation: Secondary | ICD-10-CM | POA: Diagnosis not present

## 2021-04-16 DIAGNOSIS — E785 Hyperlipidemia, unspecified: Secondary | ICD-10-CM | POA: Diagnosis not present

## 2021-04-16 DIAGNOSIS — Z09 Encounter for follow-up examination after completed treatment for conditions other than malignant neoplasm: Secondary | ICD-10-CM | POA: Diagnosis not present

## 2021-04-16 DIAGNOSIS — N179 Acute kidney failure, unspecified: Secondary | ICD-10-CM | POA: Diagnosis not present

## 2021-04-16 DIAGNOSIS — I10 Essential (primary) hypertension: Secondary | ICD-10-CM | POA: Diagnosis not present

## 2021-04-16 DIAGNOSIS — Z950 Presence of cardiac pacemaker: Secondary | ICD-10-CM | POA: Diagnosis not present

## 2021-04-20 ENCOUNTER — Other Ambulatory Visit: Payer: Self-pay

## 2021-04-20 NOTE — Patient Outreach (Signed)
[facility]) Care Management  04/20/2021  Kristin [name] 05/12/1930 [id number]   Telephone Assessment   Unsuccessful outreach call to patient.   Plan: RN CM will make outreach attempt within 3-4 business days.   Enzo Montgomery, RN,BSN,CCM [place] Management Telephonic Care Management Coordinator Direct Phone: [phone] Toll Free: [phone] Fax: [phone]

## 2021-04-21 ENCOUNTER — Other Ambulatory Visit: Payer: Self-pay

## 2021-04-21 NOTE — Patient Outreach (Signed)
[facility]) Care Management  04/21/2021  Kristin [name] 1930/03/10 [id number]   Telephone Assessment    Unsuccessful outreach attempt to patient.     Plan: RN CM will make outreach attempt within 3-4 business days.   Enzo Montgomery, RN,BSN,CCM [place] Management Telephonic Care Management Coordinator Direct Phone: [phone] Toll Free: [phone] Fax: [phone]

## 2021-04-22 ENCOUNTER — Other Ambulatory Visit: Payer: Self-pay

## 2021-04-22 NOTE — Patient Outreach (Signed)
[place]) Care Management  04/22/2021  KYOKO ELSEA [birth year] [id number]   Telephone Assessment   Unsuccessful outreach attempt to patient-busy signal.     Plan: RN CM will make outreach attempt to patient within the month of Sept.     Ritu Gagliardo [place] Management Telephonic Care Management Coordinator Direct Phone: [phone] Toll Free: [phone] Fax: [phone]

## 2021-04-23 ENCOUNTER — Other Ambulatory Visit: Payer: Self-pay | Admitting: Family Medicine

## 2021-04-23 DIAGNOSIS — G2581 Restless legs syndrome: Secondary | ICD-10-CM

## 2021-04-23 NOTE — Telephone Encounter (Signed)
Future OV 04/24/21 Approved per protocol.  Requested Prescriptions  Pending Prescriptions Disp Refills  . rOPINIRole (REQUIP) 2 MG tablet [Pharmacy Med Name: ROPINIROLE HCL 2 MG TABLET] 180 tablet 1    Sig: TAKE 2 TABLETS BY MOUTH AT BEDTIME     Neurology:  Parkinsonian Agents Passed - 04/23/2021 12:16 PM      Passed - Last BP in normal range    BP Readings from Last 1 Encounters:  04/09/21 103/80         Passed - Valid encounter within last 12 months    Recent Outpatient Visits          3 weeks ago Annual physical exam   [place], DO   5 months ago Chronic respiratory failure with hypoxia [facility])   [facility] Olin Hauser, DO   6 months ago Benign hypertension with CKD (chronic kidney disease) stage III [facility])   [place], DO   8 months ago Community acquired pneumonia of right lower lobe of lung   [place], DO   8 months ago Bilateral lower extremity edema   [place], Devonne Doughty, DO      Future Appointments            Tomorrow [facility], [facility], [place]   In 2 months [facility], Devonne Doughty, [facility], [facility]

## 2021-04-24 ENCOUNTER — Other Ambulatory Visit: Payer: Self-pay

## 2021-04-24 ENCOUNTER — Ambulatory Visit (INDEPENDENT_AMBULATORY_CARE_PROVIDER_SITE_OTHER): Payer: Medicare HMO | Admitting: Family Medicine

## 2021-04-24 ENCOUNTER — Encounter: Payer: Self-pay | Admitting: Family Medicine

## 2021-04-24 VITALS — BP 129/72 | HR 83 | Ht 67.0 in | Wt 152.8 lb

## 2021-04-24 DIAGNOSIS — N183 Chronic kidney disease, stage 3 unspecified: Secondary | ICD-10-CM | POA: Diagnosis not present

## 2021-04-24 DIAGNOSIS — I129 Hypertensive chronic kidney disease with stage 1 through stage 4 chronic kidney disease, or unspecified chronic kidney disease: Secondary | ICD-10-CM | POA: Diagnosis not present

## 2021-04-24 DIAGNOSIS — I4892 Unspecified atrial flutter: Secondary | ICD-10-CM | POA: Diagnosis not present

## 2021-04-24 DIAGNOSIS — I4891 Unspecified atrial fibrillation: Secondary | ICD-10-CM

## 2021-04-24 DIAGNOSIS — I5032 Chronic diastolic (congestive) heart failure: Secondary | ICD-10-CM

## 2021-04-24 NOTE — Progress Notes (Signed)
Subjective:    [name], female    DOB: 1930-07-09, 87 y.o.   MRN: [id number]  Kristin [name] is a 87 y.o. female presenting on 04/24/2021 for Hospitalization Follow-up, Hypertension, and Fatigue   HPI  HOSPITAL FOLLOW-UP VISIT  Hospital/Location: [facility] Date of Admission: 04/07/21 Date of Discharge: 04/09/21 Transitions of care telephone call: attempted transition call on 04/22/21 multiple attempt  Reason for Admission: Weakness, AKI, Hypotension  - Hospital H&P and Discharge Summary have been reviewed - Patient presents today 15 days after recent hospitalization. Brief summary of recent course, patient had symptoms of weakness, and hypotension due to overdiuresis on medications.hospitalized and treated with IV fluid rehydration, monitoring electrolytes, AKI had Cr up to 2.3 then it improved, she was held on diuretic and lisinopril. Question elevated troponin but ruled out NSTEMI.  - Today reports overall has done well after discharge. Symptoms of weakness have resolved.  She has followed up with [facility] 04/16/21 already they agreed with holding ACEi and Lasix. They checked BMET and Cr was 1.5 at that time.  Her granddaughter admin her medications.  - New medications on discharge: None - Changes to current meds on discharge: Discontinued/Held Lisinopril, Furosemide  I have reviewed the discharge medication list, and have reconciled the current and discharge medications today.   Current Outpatient Medications:    rOPINIRole (REQUIP) 2 MG tablet, TAKE 2 TABLETS BY MOUTH AT BEDTIME, Disp: 180 tablet, Rfl: 1   acetaminophen (TYLENOL) 500 MG tablet, Take 500 mg by mouth in the morning and at bedtime., Disp: , Rfl:    albuterol (PROVENTIL) (2.5 MG/3ML) 0.083% nebulizer solution, Take 3 mLs (2.5 mg total) by nebulization every 8 (eight) hours as needed for wheezing or shortness of breath., Disp: 75 mL, Rfl: 3   apixaban (ELIQUIS) 2.5 MG TABS tablet, Take 1 tablet (2.5 mg  total) by mouth 2 (two) times daily., Disp: 60 tablet, Rfl: 0   aspirin EC 81 MG tablet, Take 1 tablet (81 mg total) by mouth daily. Swallow whole., Disp: 150 tablet, Rfl: 2   atorvastatin (LIPITOR) 20 MG tablet, TAKE 1 TABLET BY MOUTH ONCE DAILY FOR CHOLESTEROL, Disp: 30 tablet, Rfl: 11   bisacodyl (DULCOLAX) 5 MG EC tablet, Take 2 tablets (10 mg total) by mouth daily as needed for moderate constipation., Disp: 180 tablet, Rfl: 1   Cholecalciferol (VITAMIN D) 2000 units tablet, Take 2,000 Units by mouth daily. , Disp: , Rfl:    gabapentin (NEURONTIN) 100 MG capsule, Take 3 capsules (300 mg total) by mouth at bedtime., Disp: 270 capsule, Rfl: 1   levocetirizine (XYZAL) 5 MG tablet, TAKE (1/2) TABLET BY MOUTH EVERY OTHER DAY., Disp: 23 tablet, Rfl: 3   Melatonin 10 MG TABS, Take 10 mg by mouth at bedtime., Disp: , Rfl:    oxyCODONE 10 MG TABS, Take 1 tablet (10 mg total) by mouth in the morning and at bedtime. Must last 30 days., Disp: 60 tablet, Rfl: 0   OXYGEN, Inhale 2 L into the lungs at bedtime. , Disp: , Rfl:    solifenacin (VESICARE) 5 MG tablet, Take 1 tablet by mouth daily., Disp: , Rfl:    umeclidinium-vilanterol (ANORO ELLIPTA) 62.5-25 MCG/INH AEPB, Inhale 1 puff into the lungs daily., Disp: , Rfl:   ------------------------------------------------------------------------- Social History   Tobacco Use   Smoking status: Former    Packs/day: 1.25    Years: 40.00    Pack years: 50.00    Types: Cigarettes  Quit date: 12/16/1998    Years since quitting: 22.3   Smokeless tobacco: Former  Scientific laboratory technician Use: Never used  Substance Use Topics   Alcohol use: No    Alcohol/week: 0.0 standard drinks   Drug use: Yes    Types: Oxycodone    Review of Systems Per HPI unless specifically indicated above     Objective:    BP 129/72   Pulse 83   Ht 5\' 7"  (1.702 m)   Wt 152 lb 12.8 oz (69.3 kg)   SpO2 93%   BMI 23.93 kg/m   Wt Readings from Last 3 Encounters:  04/24/21 152  lb 12.8 oz (69.3 kg)  04/08/21 146 lb (66.2 kg)  04/02/21 144 lb 12.8 oz (65.7 kg)    Physical Exam Vitals and nursing note reviewed.  Constitutional:      General: She is not in acute distress.    Appearance: She is well-developed. She is not diaphoretic.     Comments: Currently well appearing elderly 87 yr female comfortable, cooperative  HENT:     Head: Normocephalic and atraumatic.  Eyes:     General:        Right eye: No discharge.        Left eye: No discharge.     Conjunctiva/sclera: Conjunctivae normal.  Neck:     Thyroid: No thyromegaly.  Cardiovascular:     Rate and Rhythm: Normal rate and regular rhythm.     Heart sounds: Normal heart sounds. No murmur heard. Pulmonary:     Effort: Pulmonary effort is normal. No respiratory distress.     Breath sounds: Normal breath sounds. No wheezing or rales.  Musculoskeletal:        General: Normal range of motion.     Cervical back: Normal range of motion and neck supple.     Right lower leg: Edema (+1 stable baseline edema) present.     Left lower leg: Edema (+1 stable baseline edema) present.  Lymphadenopathy:     Cervical: No cervical adenopathy.  Skin:    General: Skin is warm and dry.     Findings: No erythema or rash.  Neurological:     Mental Status: She is alert and oriented to person, place, and time.  Psychiatric:        Behavior: Behavior normal.     Comments: Well groomed, good eye contact, normal speech and thoughts     Results for orders placed or performed during the hospital encounter of 04/07/21  Resp Panel by RT-PCR (Flu A&B, Covid) Nasopharyngeal Swab   Specimen: Nasopharyngeal Swab; Nasopharyngeal(NP) swabs in vial transport medium  Result Value Ref Range   SARS Coronavirus 2 by RT PCR NEGATIVE NEGATIVE   Influenza A by PCR NEGATIVE NEGATIVE   Influenza B by PCR NEGATIVE NEGATIVE  Basic metabolic panel  Result Value Ref Range   Sodium 134 (L) 135 - 145 mmol/L   Potassium 5.3 (H) 3.5 - 5.1 mmol/L    Chloride 106 98 - 111 mmol/L   CO2 18 (L) 22 - 32 mmol/L   Glucose, Bld 90 70 - 99 mg/dL   BUN 71 (H) 8 - 23 mg/dL   Creatinine, Ser 2.33 (H) 0.44 - 1.00 mg/dL   Calcium 9.7 8.9 - 10.3 mg/dL   GFR, Estimated 20 (L) >60 mL/min   Anion gap 10 5 - 15  CBC  Result Value Ref Range   WBC 9.6 4.0 - 10.5 K/uL   RBC 4.03 3.87 -  5.11 MIL/uL   Hemoglobin 12.6 12.0 - 15.0 g/dL   HCT 39.3 36.0 - 46.0 %   MCV 97.5 80.0 - 100.0 fL   MCH 31.3 26.0 - 34.0 pg   MCHC 32.1 30.0 - 36.0 g/dL   RDW 14.1 11.5 - 15.5 %   Platelets 152 150 - 400 K/uL   nRBC 0.0 0.0 - 0.2 %  Basic metabolic panel  Result Value Ref Range   Sodium 138 135 - 145 mmol/L   Potassium 5.0 3.5 - 5.1 mmol/L   Chloride 112 (H) 98 - 111 mmol/L   CO2 19 (L) 22 - 32 mmol/L   Glucose, Bld 73 70 - 99 mg/dL   BUN 60 (H) 8 - 23 mg/dL   Creatinine, Ser 1.70 (H) 0.44 - 1.00 mg/dL   Calcium 9.3 8.9 - 10.3 mg/dL   GFR, Estimated 28 (L) >60 mL/min   Anion gap 7 5 - 15  CBC  Result Value Ref Range   WBC 9.5 4.0 - 10.5 K/uL   RBC 4.15 3.87 - 5.11 MIL/uL   Hemoglobin 12.8 12.0 - 15.0 g/dL   HCT 40.2 36.0 - 46.0 %   MCV 96.9 80.0 - 100.0 fL   MCH 30.8 26.0 - 34.0 pg   MCHC 31.8 30.0 - 36.0 g/dL   RDW 14.1 11.5 - 15.5 %   Platelets 138 (L) 150 - 400 K/uL   nRBC 0.0 0.0 - 0.2 %  APTT  Result Value Ref Range   aPTT 32 24 - 36 seconds  Protime-INR  Result Value Ref Range   Prothrombin Time 15.4 (H) 11.4 - 15.2 seconds   INR 1.2 0.8 - 1.2  Heparin level (unfractionated)  Result Value Ref Range   Heparin Unfractionated >1.10 (H) 0.30 - 0.70 IU/mL  APTT  Result Value Ref Range   aPTT 110 (H) 24 - 36 seconds  Troponin I (High Sensitivity)  Result Value Ref Range   Troponin I (High Sensitivity) 28 (H) <18 ng/L  Troponin I (High Sensitivity)  Result Value Ref Range   Troponin I (High Sensitivity) 26 (H) <18 ng/L  Troponin I (High Sensitivity)  Result Value Ref Range   Troponin I (High Sensitivity) 24 (H) <18 ng/L  Troponin I  (High Sensitivity)  Result Value Ref Range   Troponin I (High Sensitivity) 27 (H) <18 ng/L      Assessment & Plan:   Problem List Items Addressed This Visit     Chronic diastolic CHF (congestive heart failure) (HCC) - Primary   Benign hypertension with CKD (chronic kidney disease) stage III (HCC)   Atrial fibrillation and flutter (HCC)   AKI - resolving. CKD III  HFU reviewed discharge summary and cardiology f/u  Likely dehydration and hypotension due to diuretics  Continue on current plan - HOLD Lisinopril and Furosemide.  May resume Furosemide 20mg  dose only PRN IF EDEMA / Wt gain as advised. May take half of 40mg  or 20mg  whole pill. Managed by [facility]  They checked chemistry BMET last week with improved Creatinine.     No orders of the defined types were placed in this encounter.   Follow up plan: Return if symptoms worsen or fail to improve.  [name], [facility] 04/24/2021, 2:37 PM

## 2021-04-24 NOTE — Patient Instructions (Addendum)
Thank you for coming to the office today.  Keep on current plan.  The fluid pills were likely too strong caused decreased blood pressure and kidney damage.  Discontinue / remain OFF Lisinopril  HOLD Furosemide (Lasix) - as advised by hospital and Cardiology. You may take the Furosemide (lasix) 20mg  dose (maybe half of a 40mg  or whole 20mg  pill) ONLY IF NEEDED for swelling, increased fluid swelling in legs AND difficulty breathing due to fluid OR weight gain suddenly within 24 hours of 2+ lbs and thought to be due to retained fluid.  Please schedule a Follow-up Appointment to: Return if symptoms worsen or fail to improve.  If you have any other questions or concerns, please feel free to call the office or send a message through [place]. You may also schedule an earlier appointment if necessary.  Additionally, you may be receiving a survey about your experience at our office within a few days to 1 week by e-mail or mail. We value your feedback.  [name], DO [place]

## 2021-05-01 ENCOUNTER — Telehealth: Payer: Self-pay | Admitting: Family Medicine

## 2021-05-01 NOTE — Telephone Encounter (Signed)
Left message for patient to call back and schedule the Medicare Annual Wellness Visit (AWV) virtually or by telephone.  Last AWV 05/13/20  Please schedule at anytime after 05/13/21 with [place].  40 minute appointment  Any questions, please call me at [phone]

## 2021-05-05 DIAGNOSIS — I495 Sick sinus syndrome: Secondary | ICD-10-CM | POA: Diagnosis not present

## 2021-05-11 DIAGNOSIS — J45998 Other asthma: Secondary | ICD-10-CM | POA: Diagnosis not present

## 2021-05-13 DIAGNOSIS — J45998 Other asthma: Secondary | ICD-10-CM | POA: Diagnosis not present

## 2021-05-14 ENCOUNTER — Encounter: Payer: Self-pay | Admitting: Podiatry

## 2021-05-14 ENCOUNTER — Ambulatory Visit (INDEPENDENT_AMBULATORY_CARE_PROVIDER_SITE_OTHER): Payer: Medicare HMO | Admitting: Podiatry

## 2021-05-14 ENCOUNTER — Other Ambulatory Visit: Payer: Self-pay

## 2021-05-14 DIAGNOSIS — M79676 Pain in unspecified toe(s): Secondary | ICD-10-CM

## 2021-05-14 DIAGNOSIS — D689 Coagulation defect, unspecified: Secondary | ICD-10-CM

## 2021-05-14 DIAGNOSIS — B351 Tinea unguium: Secondary | ICD-10-CM

## 2021-05-14 DIAGNOSIS — I872 Venous insufficiency (chronic) (peripheral): Secondary | ICD-10-CM

## 2021-05-14 DIAGNOSIS — I739 Peripheral vascular disease, unspecified: Secondary | ICD-10-CM

## 2021-05-14 NOTE — Progress Notes (Signed)
This patient returns to my office for at risk foot care.  This patient requires this care by a professional since this patient will be at risk due to having PVD, CKD, venous insufficiency and coagulation defect.  This patient is unable to cut nails herself since the patient cannot reach her nails.These nails are painful walking and wearing shoes.  This patient presents for at risk foot care today.  General Appearance  Alert, conversant and in no acute stress.  Vascular  Dorsalis pedis and posterior tibial  pulses are weakly  palpable  bilaterally.  Capillary return is within normal limits  bilaterally. Cold feet. bilaterally.  Absent digital hair.  Neurologic  Senn-Weinstein monofilament wire test within normal limits  bilaterally. Muscle power within normal limits bilaterally.  Nails Thick disfigured discolored nails with subungual debris  from hallux to fifth toes bilaterally. No evidence of bacterial infection or drainage bilaterally.  Orthopedic  No limitations of motion  feet .  No crepitus or effusions noted.  No bony pathology or digital deformities noted.  Skin  normotropic skin with no porokeratosis noted bilaterally.  No signs of infections or ulcers noted.   Healing skin lesion left ankle lateral malleolar.  Onychomycosis  Pain in right toes  Pain in left toes  Consent was obtained for treatment procedures.   Mechanical debridement of nails 1-5  bilaterally performed with a nail nipper.  Filed with dremel without incident.    Return office visit   3 months                   Told patient to return for periodic foot care and evaluation due to potential at risk complications.   Gardiner Barefoot DPM

## 2021-05-18 ENCOUNTER — Ambulatory Visit: Payer: Self-pay | Admitting: *Deleted

## 2021-05-18 NOTE — Telephone Encounter (Signed)
Reason for Disposition  [1] MILD difficulty breathing (e.g., minimal/no SOB at rest, SOB with walking, pulse <100) AND [2] still present when not coughing  Answer Assessment - Initial Assessment Questions 1. ONSET: "When did the cough begin?"      1 week 2. SEVERITY: "How bad is the cough today?"      Up at night 3. SPUTUM: "Describe the color of your sputum" (none, dry cough; clear, white, yellow, green)     white 4. HEMOPTYSIS: "Are you coughing up any blood?" If so ask: "How much?" (flecks, streaks, tablespoons, etc.)     no 5. DIFFICULTY BREATHING: "Are you having difficulty breathing?" If Yes, ask: "How bad is it?" (e.g., mild, moderate, severe)    - MILD: No SOB at rest, mild SOB with walking, speaks normally in sentences, can lie down, no retractions, pulse < 100.    - MODERATE: SOB at rest, SOB with minimal exertion and prefers to sit, cannot lie down flat, speaks in phrases, mild retractions, audible wheezing, pulse 100-120.    - SEVERE: Very SOB at rest, speaks in single words, struggling to breathe, sitting hunched forward, retractions, pulse > 120      mild 6. FEVER: "Do you have a fever?" If Yes, ask: "What is your temperature, how was it measured, and when did it start?"     no 7. CARDIAC HISTORY: "Do you have any history of heart disease?" (e.g., heart attack, congestive heart failure)      CHF 8. LUNG HISTORY: "Do you have any history of lung disease?"  (e.g., pulmonary embolus, asthma, emphysema)     no 9. PE RISK FACTORS: "Do you have a history of blood clots?" (or: recent major surgery, recent prolonged travel, bedridden)     no 10. OTHER SYMPTOMS: "Do you have any other symptoms?" (e.g., runny nose, wheezing, chest pain)       Runny nose 11. PREGNANCY: "Is there any chance you are pregnant?" "When was your last menstrual period?"       N/a 12. TRAVEL: "Have you traveled out of the country in the last month?" (e.g., travel history, exposures)       no  Protocols  used: Cough - Acute Productive-A-AH

## 2021-05-18 NOTE — Telephone Encounter (Signed)
Patient is calling to report she is having productive cough for 1 week- white sputum. Patient has been using OTC medication- not clearing symptoms. Patient states she does have SOB at times. Patient advised no open appointment in the office- UC for evaluation and treatment.

## 2021-05-21 ENCOUNTER — Other Ambulatory Visit: Payer: Self-pay

## 2021-05-21 ENCOUNTER — Other Ambulatory Visit: Payer: Self-pay | Admitting: Family Medicine

## 2021-05-21 DIAGNOSIS — J432 Centrilobular emphysema: Secondary | ICD-10-CM

## 2021-05-21 NOTE — Patient Outreach (Signed)
[facility]) Care Management  05/21/2021  Kristin [name] 22-Dec-1929 [id number]   Telephone Assessment    Unsuccessful outreach attempt to patient. No answer after multiple rings.   Plan: RN CM will send unsuccessful outreach letter to patient. RN CM will make outreach attempt to patient within the month of Nov.  [name] [facility] Telephonic Care Management Coordinator Direct Phone: [phone] Toll Free: [phone] Fax: [phone]

## 2021-05-27 ENCOUNTER — Other Ambulatory Visit: Payer: Self-pay

## 2021-05-27 ENCOUNTER — Ambulatory Visit: Payer: Medicare HMO | Attending: Pain Medicine | Admitting: Pain Medicine

## 2021-05-27 ENCOUNTER — Encounter: Payer: Self-pay | Admitting: Pain Medicine

## 2021-05-27 VITALS — BP 145/89 | HR 81 | Temp 97.1°F | Resp 15 | Ht 68.0 in | Wt 150.0 lb

## 2021-05-27 DIAGNOSIS — M25511 Pain in right shoulder: Secondary | ICD-10-CM | POA: Insufficient documentation

## 2021-05-27 DIAGNOSIS — G8929 Other chronic pain: Secondary | ICD-10-CM | POA: Diagnosis not present

## 2021-05-27 DIAGNOSIS — M79605 Pain in left leg: Secondary | ICD-10-CM | POA: Insufficient documentation

## 2021-05-27 DIAGNOSIS — M25512 Pain in left shoulder: Secondary | ICD-10-CM | POA: Diagnosis present

## 2021-05-27 DIAGNOSIS — Z79891 Long term (current) use of opiate analgesic: Secondary | ICD-10-CM

## 2021-05-27 DIAGNOSIS — M542 Cervicalgia: Secondary | ICD-10-CM | POA: Insufficient documentation

## 2021-05-27 DIAGNOSIS — G894 Chronic pain syndrome: Secondary | ICD-10-CM

## 2021-05-27 DIAGNOSIS — M961 Postlaminectomy syndrome, not elsewhere classified: Secondary | ICD-10-CM

## 2021-05-27 DIAGNOSIS — M5441 Lumbago with sciatica, right side: Secondary | ICD-10-CM | POA: Insufficient documentation

## 2021-05-27 DIAGNOSIS — Z7901 Long term (current) use of anticoagulants: Secondary | ICD-10-CM | POA: Diagnosis present

## 2021-05-27 DIAGNOSIS — M533 Sacrococcygeal disorders, not elsewhere classified: Secondary | ICD-10-CM | POA: Diagnosis not present

## 2021-05-27 DIAGNOSIS — M25559 Pain in unspecified hip: Secondary | ICD-10-CM | POA: Diagnosis not present

## 2021-05-27 DIAGNOSIS — M48061 Spinal stenosis, lumbar region without neurogenic claudication: Secondary | ICD-10-CM | POA: Diagnosis not present

## 2021-05-27 DIAGNOSIS — M5412 Radiculopathy, cervical region: Secondary | ICD-10-CM | POA: Diagnosis not present

## 2021-05-27 DIAGNOSIS — M5442 Lumbago with sciatica, left side: Secondary | ICD-10-CM | POA: Diagnosis not present

## 2021-05-27 DIAGNOSIS — Z79899 Other long term (current) drug therapy: Secondary | ICD-10-CM | POA: Diagnosis present

## 2021-05-27 DIAGNOSIS — M47812 Spondylosis without myelopathy or radiculopathy, cervical region: Secondary | ICD-10-CM | POA: Diagnosis not present

## 2021-05-27 DIAGNOSIS — M47816 Spondylosis without myelopathy or radiculopathy, lumbar region: Secondary | ICD-10-CM

## 2021-05-27 DIAGNOSIS — R937 Abnormal findings on diagnostic imaging of other parts of musculoskeletal system: Secondary | ICD-10-CM | POA: Diagnosis present

## 2021-05-27 DIAGNOSIS — M79604 Pain in right leg: Secondary | ICD-10-CM | POA: Diagnosis not present

## 2021-05-27 MED ORDER — OXYCODONE HCL 5 MG PO TABS
ORAL_TABLET | ORAL | 0 refills | Status: DC
Start: 2021-05-27 — End: 2021-08-18

## 2021-05-27 MED ORDER — OXYCODONE HCL 5 MG PO TABS: ORAL_TABLET | ORAL | 0 refills | Status: DC

## 2021-05-27 NOTE — Patient Instructions (Addendum)
____________________________________________________________________________________________  Medication Rules  Purpose: To inform patients, and their family members, of our rules and regulations.  Applies to: All patients receiving prescriptions (written or electronic).  Pharmacy of record: Pharmacy where electronic prescriptions will be sent. If written prescriptions are taken to a different pharmacy, please inform the nursing staff. The pharmacy listed in the electronic medical record should be the one where you would like electronic prescriptions to be sent.  Electronic prescriptions: In compliance with the [place] (STOP) Act of 2017 (Session Lanny Cramp [id number]), effective August 23, 2018, all controlled substances must be electronically prescribed. Calling prescriptions to the pharmacy will cease to exist.  Prescription refills: Only during scheduled appointments. Applies to all prescriptions.  NOTE: The following applies primarily to controlled substances (Opioid* Pain Medications).   Type of encounter (visit): For patients receiving controlled substances, face-to-face visits are required. (Not an option or up to the patient.)  Patient's responsibilities: Pain Pills: Bring all pain pills to every appointment (except for procedure appointments). Pill Bottles: Bring pills in original pharmacy bottle. Always bring the newest bottle. Bring bottle, even if empty. Medication refills: You are responsible for knowing and keeping track of what medications you take and those you need refilled. The day before your appointment: write a list of all prescriptions that need to be refilled. The day of the appointment: give the list to the admitting nurse. Prescriptions will be written only during appointments. No prescriptions will be written on procedure days. If you forget a medication: it will not be "Called in", "Faxed", or "electronically sent". You will  need to get another appointment to get these prescribed. No early refills. Do not call asking to have your prescription filled early. Prescription Accuracy: You are responsible for carefully inspecting your prescriptions before leaving our office. Have the discharge nurse carefully go over each prescription with you, before taking them home. Make sure that your name is accurately spelled, that your address is correct. Check the name and dose of your medication to make sure it is accurate. Check the number of pills, and the written instructions to make sure they are clear and accurate. Make sure that you are given enough medication to last until your next medication refill appointment. Taking Medication: Take medication as prescribed. When it comes to controlled substances, taking less pills or less frequently than prescribed is permitted and encouraged. Never take more pills than instructed. Never take medication more frequently than prescribed.  Inform other Doctors: Always inform, all of your healthcare providers, of all the medications you take. Pain Medication from other Providers: You are not allowed to accept any additional pain medication from any other Doctor or Healthcare provider. There are two exceptions to this rule. (see below) In the event that you require additional pain medication, you are responsible for notifying us, as stated below. Cough Medicine: Often these contain an opioid, such as codeine or hydrocodone. Never accept or take cough medicine containing these opioids if you are already taking an opioid* medication. The combination may cause respiratory failure and death. Medication Agreement: You are responsible for carefully reading and following our Medication Agreement. This must be signed before receiving any prescriptions from our practice. Safely store a copy of your signed Agreement. Violations to the Agreement will result in no further prescriptions. (Additional copies of our  Medication Agreement are available upon request.) Laws, Rules, & Regulations: All patients are expected to follow all Federal and [employer], [employer], Rules, [employer] [facility]. Ignorance of  the Laws does not constitute a valid excuse.  Illegal drugs and Controlled Substances: The use of illegal substances (including, but not limited to marijuana and its derivatives) and/or the illegal use of any controlled substances is strictly prohibited. Violation of this rule may result in the immediate and permanent discontinuation of any and all prescriptions being written by our practice. The use of any illegal substances is prohibited. Adopted CDC guidelines & recommendations: Target dosing levels will be at or below 60 MME/day. Use of benzodiazepines** is not recommended.  Exceptions: There are only two exceptions to the rule of not receiving pain medications from other Healthcare Providers. Exception #1 (Emergencies): In the event of an emergency (i.e.: accident requiring emergency care), you are allowed to receive additional pain medication. However, you are responsible for: As soon as you are able, call our office [phone], at any time of the day or night, and leave a message stating your name, the date and nature of the emergency, and the name and dose of the medication prescribed. In the event that your call is answered by a member of our staff, make sure to document and save the date, time, and the name of the person that took your information.  Exception #2 (Planned Surgery): In the event that you are scheduled by another doctor or dentist to have any type of surgery or procedure, you are allowed (for a period no longer than 30 days), to receive additional pain medication, for the acute post-op pain. However, in this case, you are responsible for picking up a copy of our "Post-op Pain Management for Surgeons" handout, and giving it to your surgeon or dentist. This document is available at our office, and  does not require an appointment to obtain it. Simply go to our office during business hours (Monday-Thursday from 8:00 AM to 4:00 PM) (Friday 8:00 AM to 12:00 Noon) or if you have a scheduled appointment with [name], prior to your surgery, and ask for it by name. In addition, you are responsible for: calling our office [phone], at any time of the day or night, and leaving a message stating your name, name of your surgeon, type of surgery, and date of procedure or surgery. Failure to comply with your responsibilities may result in termination of therapy involving the controlled substances. Medication Agreement Violation. Following the above rules, including your responsibilities will help you in avoiding a Medication Agreement Violation ("Breaking your Pain Medication Contract").  *Opioid medications include: morphine, codeine, oxycodone, oxymorphone, hydrocodone, hydromorphone, meperidine, tramadol, tapentadol, buprenorphine, fentanyl, methadone. **Benzodiazepine medications include: diazepam (Valium), alprazolam (Xanax), clonazepam (Klonopine), lorazepam (Ativan), clorazepate (Tranxene), chlordiazepoxide (Librium), estazolam (Prosom), oxazepam (Serax), temazepam (Restoril), triazolam (Halcion) (Last updated: 05/20/2021) ____________________________________________________________________________________________  ____________________________________________________________________________________________  Medication Recommendations and Reminders  Applies to: All patients receiving prescriptions (written and/or electronic).  Medication Rules & Regulations: These rules and regulations exist for your safety and that of others. They are not flexible and neither are we. Dismissing or ignoring them will be considered "non-compliance" with medication therapy, resulting in complete and irreversible termination of such therapy. (See document titled "Medication Rules" for more details.) In all conscience,  because of safety reasons, we cannot continue providing a therapy where the patient does not follow instructions.  Pharmacy of record:  Definition: This is the pharmacy where your electronic prescriptions will be sent.  We do not endorse any particular pharmacy, however, we have experienced problems with [facility] not securing enough medication supply for the community. We do not restrict you  in your choice of pharmacy. However, once we write for your prescriptions, we will NOT be re-sending more prescriptions to fix restricted supply problems created by your pharmacy, or your insurance.  The pharmacy listed in the electronic medical record should be the one where you want electronic prescriptions to be sent. If you choose to change pharmacy, simply notify our nursing staff.  Recommendations: Keep all of your pain medications in a safe place, under lock and key, even if you live alone. We will NOT replace lost, stolen, or damaged medication. After you fill your prescription, take 1 week's worth of pills and put them away in a safe place. You should keep a separate, properly labeled bottle for this purpose. The remainder should be kept in the original bottle. Use this as your primary supply, until it runs out. Once it's gone, then you know that you have 1 week's worth of medicine, and it is time to come in for a prescription refill. If you do this correctly, it is unlikely that you will ever run out of medicine. To make sure that the above recommendation works, it is very important that you make sure your medication refill appointments are scheduled at least 1 week before you run out of medicine. To do this in an effective manner, make sure that you do not leave the office without scheduling your next medication management appointment. Always ask the nursing staff to show you in your prescription , when your medication will be running out. Then arrange for the receptionist to get you a return appointment,  at least 7 days before you run out of medicine. Do not wait until you have 1 or 2 pills left, to come in. This is very poor planning and does not take into consideration that we may need to cancel appointments due to bad weather, sickness, or emergencies affecting our staff. DO NOT ACCEPT A "Partial Fill": If for any reason your pharmacy does not have enough pills/tablets to completely fill or refill your prescription, do not allow for a "partial fill". The law allows the pharmacy to complete that prescription within 72 hours, without requiring a new prescription. If they do not fill the rest of your prescription within those 72 hours, you will need a separate prescription to fill the remaining amount, which we will NOT provide. If the reason for the partial fill is your insurance, you will need to talk to the pharmacist about payment alternatives for the remaining tablets, but again, DO NOT ACCEPT A PARTIAL FILL, unless you can trust your pharmacist to obtain the remainder of the pills within 72 hours.  Prescription refills and/or changes in medication(s):  Prescription refills, and/or changes in dose or medication, will be conducted only during scheduled medication management appointments. (Applies to both, written and electronic prescriptions.) No refills on procedure days. No medication will be changed or started on procedure days. No changes, adjustments, and/or refills will be conducted on a procedure day. Doing so will interfere with the diagnostic portion of the procedure. No phone refills. No medications will be "called into the pharmacy". No Fax refills. No weekend refills. No Holliday refills. No after hours refills.  Remember:  Business hours are:  Monday to Thursday 8:00 AM to 4:00 PM Provider's Schedule: Milinda Pointer, MD - Appointments are:  Medication management: Monday and Wednesday 8:00 AM to 4:00 PM Procedure day: Tuesday and Thursday 7:30 AM to 4:00 PM Gillis Santa, MD -  Appointments are:  Medication management: Tuesday and Thursday 8:00  AM to 4:00 PM Procedure day: Monday and Wednesday 7:30 AM to 4:00 PM (Last update: 03/12/2020) ____________________________________________________________________________________________  ____________________________________________________________________________________________  CBD (cannabidiol) & Delta-8 (Delta-8 tetrahydrocannabinol) WARNING  Intro: Cannabidiol (CBD) and tetrahydrocannabinol (THC), are two natural compounds found in plants of the Cannabis genus. They can both be extracted from hemp or cannabis. Hemp and cannabis come from the Cannabis sativa plant. Both compounds interact with your body's endocannabinoid system, but they have very different effects. CBD does not produce the high sensation associated with cannabis. Delta-8 tetrahydrocannabinol, also known as delta-8 THC, is a psychoactive substance found in the Cannabis sativa plant, of which marijuana and hemp are two varieties. THC is responsible for the high associated with the illicit use of marijuana.  Applicable to: All individuals currently taking or considering taking CBD (cannabidiol) and, more important, all patients taking opioid analgesic controlled substances (pain medication). (Example: oxycodone; oxymorphone; hydrocodone; hydromorphone; morphine; methadone; tramadol; tapentadol; fentanyl; buprenorphine; butorphanol; dextromethorphan; meperidine; codeine; etc.)  Legal status: CBD remains a Schedule I drug prohibited for any use. CBD is illegal with one exception. In the [place], CBD has a limited Transport planner (FDA) approval for the treatment of two specific types of epilepsy disorders. Only one CBD product has been approved by the FDA for this purpose: "Epidiolex". FDA is aware that some companies are marketing products containing cannabis and cannabis-derived compounds in ways that violate the [employer], Drug and Cosmetic Act  [facility] Act) and that may put the health and safety of consumers at risk. The FDA, a Federal agency, has not enforced the CBD status since 2018.   Legality: Some manufacturers ship CBD products nationally, which is illegal. Often such products are sold online and are therefore available throughout the country. CBD is openly sold in head shops and health food stores in some states where such sales have not been explicitly legalized. Selling unapproved products with unsubstantiated therapeutic claims is not only a violation of the law, but also can put patients at risk, as these products have not been proven to be safe or effective. Federal illegality makes it difficult to conduct research on CBD.  Reference: "FDA Regulation of Cannabis and Cannabis-Derived Products, Including Cannabidiol (CBD)" - [employer]  Warning: CBD is not FDA approved and has not undergo the same manufacturing controls as prescription drugs.  This means that the purity and safety of available CBD may be questionable. Most of the time, despite manufacturer's claims, it is contaminated with THC (delta-9-tetrahydrocannabinol - the chemical in marijuana responsible for the "HIGH").  When this is the case, the [facility] contaminant will trigger a positive urine drug screen (UDS) test for Marijuana (carboxy-THC). Because a positive UDS for any illicit substance is a violation of our medication agreement, your opioid analgesics (pain medicine) may be permanently discontinued. The FDA recently put out a warning about 5 things that everyone should be aware of regarding Delta-8 THC: Delta-8 THC products have not been evaluated or approved by the FDA for safe use and may be marketed in ways that put the public health at risk. The FDA has received adverse event reports involving delta-8 THC-containing products. Delta-8 THC has  psychoactive and intoxicating effects. Delta-8 THC manufacturing often involve use of potentially harmful chemicals to create the concentrations of delta-8 THC claimed in the marketplace. The final delta-8 THC product may have potentially harmful by-products (contaminants) due to the chemicals used in the process. Manufacturing of delta-8 THC products may occur in uncontrolled or unsanitary settings, which may  lead to the presence of unsafe contaminants or other potentially harmful substances. Delta-8 THC products should be kept out of the reach of children and pets.  MORE ABOUT CBD  General Information: CBD was discovered in [birth year] and it is a derivative of the cannabis sativa genus plants (Marijuana and Hemp). It is one of the 113 identified substances found in Marijuana. It accounts for up to 40% of the plant's extract. As of 2018, preliminary clinical studies on CBD included research for the treatment of anxiety, movement disorders, and pain. CBD is available and consumed in multiple forms, including inhalation of smoke or vapor, as an aerosol spray, and by mouth. It may be supplied as an oil containing CBD, capsules, dried cannabis, or as a liquid solution. CBD is thought not to be as psychoactive as THC (delta-9-tetrahydrocannabinol - the chemical in marijuana responsible for the "HIGH"). Studies suggest that CBD may interact with different biological target receptors in the body, including cannabinoid and other neurotransmitter receptors. As of 2018 the mechanism of action for its biological effects has not been determined.  Side-effects  Adverse reactions: Dry mouth, diarrhea, decreased appetite, fatigue, drowsiness, malaise, weakness, sleep disturbances, and others.  Drug interactions: CBC may interact with other medications such as blood-thinners. (Last update: 05/22/2021) ____________________________________________________________________________________________   ____________________________________________________________________________________________  Drug Holidays (Slow)  What is a "Drug Holiday"? Drug Holiday: is the name given to the period of time during which a patient stops taking a medication(s) for the purpose of eliminating tolerance to the drug.  Benefits Improved effectiveness of opioids. Decreased opioid dose needed to achieve benefits. Improved pain with lesser dose.  What is tolerance? Tolerance: is the progressive decreased in effectiveness of a drug due to its repetitive use. With repetitive use, the body gets use to the medication and as a consequence, it loses its effectiveness. This is a common problem seen with opioid pain medications. As a result, a larger dose of the drug is needed to achieve the same effect that used to be obtained with a smaller dose.  How long should a "Drug Holiday" last? You should stay off of the pain medicine for at least 14 consecutive days. (2 weeks)  Should I stop the medicine "cold [place]"? No. You should always coordinate with your Pain Specialist so that he/she can provide you with the correct medication dose to make the transition as smoothly as possible.  How do I stop the medicine? Slowly. You will be instructed to decrease the daily amount of pills that you take by one (1) pill every seven (7) days. This is called a "slow downward taper" of your dose. For example: if you normally take four (4) pills per day, you will be asked to drop this dose to three (3) pills per day for seven (7) days, then to two (2) pills per day for seven (7) days, then to one (1) per day for seven (7) days, and at the end of those last seven (7) days, this is when the "Drug Holiday" would start.   Will I have withdrawals? By doing a "slow downward taper" like this one, it is unlikely that you will experience any significant withdrawal symptoms. Typically, what triggers withdrawals is the sudden stop of a high dose  opioid therapy. Withdrawals can usually be avoided by slowly decreasing the dose over a prolonged period of time. If you do not follow these instructions and decide to stop your medication abruptly, withdrawals may be possible.  What are withdrawals? Withdrawals: refers to  the wide range of symptoms that occur after stopping or dramatically reducing opiate drugs after heavy and prolonged use. Withdrawal symptoms do not occur to patients that use low dose opioids, or those who take the medication sporadically. Contrary to benzodiazepine (example: Valium, Xanax, etc.) or alcohol withdrawals ("Delirium Tremens"), opioid withdrawals are not lethal. Withdrawals are the physical manifestation of the body getting rid of the excess receptors.  Expected Symptoms Early symptoms of withdrawal may include: Agitation Anxiety Muscle aches Increased tearing Insomnia Runny nose Sweating Yawning  Late symptoms of withdrawal may include: Abdominal cramping Diarrhea Dilated pupils Goose bumps Nausea Vomiting  Will I experience withdrawals? Due to the slow nature of the taper, it is very unlikely that you will experience any.  What is a slow taper? Taper: refers to the gradual decrease in dose.  (Last update: 03/12/2020) ____________________________________________________________________________________________   ____________________________________________________________________________________________  General Risks and Possible Complications  Patient Responsibilities: It is important that you read this as it is part of your informed consent. It is our duty to inform you of the risks and possible complications associated with treatments offered to you. It is your responsibility as a patient to read this and to ask questions about anything that is not clear or that you believe was not covered in this document.  Patient's Rights: You have the right to refuse treatment. You also have the right to  change your mind, even after initially having agreed to have the treatment done. However, under this last option, if you wait until the last second to change your mind, you may be charged for the materials used up to that point.  Introduction: Medicine is not an Chief Strategy Officer. Everything in Medicine, including the lack of treatment(s), carries the potential for danger, harm, or loss (which is by definition: Risk). In Medicine, a complication is a secondary problem, condition, or disease that can aggravate an already existing one. All treatments carry the risk of possible complications. The fact that a side effects or complications occurs, does not imply that the treatment was conducted incorrectly. It must be clearly understood that these can happen even when everything is done following the highest safety standards.  No treatment: You can choose not to proceed with the proposed treatment alternative. The "PRO(s)" would include: avoiding the risk of complications associated with the therapy. The "CON(s)" would include: not getting any of the treatment benefits. These benefits fall under one of three categories: diagnostic; therapeutic; and/or palliative. Diagnostic benefits include: getting information which can ultimately lead to improvement of the disease or symptom(s). Therapeutic benefits are those associated with the successful treatment of the disease. Finally, palliative benefits are those related to the decrease of the primary symptoms, without necessarily curing the condition (example: decreasing the pain from a flare-up of a chronic condition, such as incurable terminal cancer).  General Risks and Complications: These are associated to most interventional treatments. They can occur alone, or in combination. They fall under one of the following six (6) categories: no benefit or worsening of symptoms; bleeding; infection; nerve damage; allergic reactions; and/or death. No benefits or worsening of  symptoms: In Medicine there are no guarantees, only probabilities. No healthcare provider can ever guarantee that a medical treatment will work, they can only state the probability that it may. Furthermore, there is always the possibility that the condition may worsen, either directly, or indirectly, as a consequence of the treatment. Bleeding: This is more common if the patient is taking a blood thinner, either prescription or  over the counter (example: Goody Powders, Fish oil, Aspirin, Garlic, etc.), or if suffering a condition associated with impaired coagulation (example: Hemophilia, cirrhosis of the liver, low platelet counts, etc.). However, even if you do not have one on these, it can still happen. If you have any of these conditions, or take one of these drugs, make sure to notify your treating physician. Infection: This is more common in patients with a compromised immune system, either due to disease (example: diabetes, cancer, human immunodeficiency virus [HIV], etc.), or due to medications or treatments (example: therapies used to treat cancer and rheumatological diseases). However, even if you do not have one on these, it can still happen. If you have any of these conditions, or take one of these drugs, make sure to notify your treating physician. Nerve Damage: This is more common when the treatment is an invasive one, but it can also happen with the use of medications, such as those used in the treatment of cancer. The damage can occur to small secondary nerves, or to large primary ones, such as those in the spinal cord and brain. This damage may be temporary or permanent and it may lead to impairments that can range from temporary numbness to permanent paralysis and/or brain death. Allergic Reactions: Any time a substance or material comes in contact with our body, there is the possibility of an allergic reaction. These can range from a mild skin rash (contact dermatitis) to a severe systemic  reaction (anaphylactic reaction), which can result in death. Death: In general, any medical intervention can result in death, most of the time due to an unforeseen complication. ____________________________________________________________________________________________ ____________________________________________________________________________________________  Blood Thinners  IMPORTANT NOTICE:  If you take any of these, make sure to notify the nursing staff.  Failure to do so may result in injury.  Recommended time intervals to stop and restart blood-thinners, before & after invasive procedures  Generic Name Brand Name Pre-procedure. Stop this long before procedure. Post-procedure. Minimum waiting period before restarting.  Abciximab Reopro 15 days 2 hrs  Alteplase Activase 10 days 10 days  Anagrelide Agrylin    Apixaban Eliquis 3 days 6 hrs  Cilostazol Pletal 3 days 5 hrs  Clopidogrel Plavix 7-10 days 2 hrs  Dabigatran Pradaxa 5 days 6 hrs  Dalteparin Fragmin 24 hours 4 hrs  Dipyridamole Aggrenox 11days 2 hrs  Edoxaban Lixiana; Savaysa 3 days 2 hrs  Enoxaparin  Lovenox 24 hours 4 hrs  Eptifibatide Integrillin 8 hours 2 hrs  Fondaparinux  Arixtra 72 hours 12 hrs  Hydroxychloroquine Plaquenil 11 days   Prasugrel Effient 7-10 days 6 hrs  Reteplase Retavase 10 days 10 days  Rivaroxaban Xarelto 3 days 6 hrs  Ticagrelor Brilinta 5-7 days 6 hrs  Ticlopidine Ticlid 10-14 days 2 hrs  Tinzaparin Innohep 24 hours 4 hrs  Tirofiban Aggrastat 8 hours 2 hrs  Warfarin Coumadin 5 days 2 hrs   Other medications with blood-thinning effects  Product indications Generic (Brand) names Note  Cholesterol Lipitor Stop 4 days before procedure  Blood thinner (injectable) Heparin (LMW or LMWH Heparin) Stop 24 hours before procedure  Cancer Ibrutinib (Imbruvica) Stop 7 days before procedure  Malaria/Rheumatoid Hydroxychloroquine (Plaquenil) Stop 11 days before procedure  Thrombolytics  10 days  before or after procedures   Over-the-counter (OTC) Products with blood-thinning effects  Product Common names Stop Time  Aspirin > 325 mg Goody Powders, Excedrin, etc. 11 days  Aspirin ? 81 mg  7 days  Fish oil  4 days  Garlic  supplements  7 days  Ginkgo biloba  36 hours  Ginseng  24 hours  NSAIDs Ibuprofen, Naprosyn, etc. 3 days  Vitamin E  4 days   ____________________________________________________________________________________________ Epidural Steroid Injection Patient Information  Description: The epidural space surrounds the nerves as they exit the spinal cord.  In some patients, the nerves can be compressed and inflamed by a bulging disc or a tight spinal canal (spinal stenosis).  By injecting steroids into the epidural space, we can bring irritated nerves into direct contact with a potentially helpful medication.  These steroids act directly on the irritated nerves and can reduce swelling and inflammation which often leads to decreased pain.  Epidural steroids may be injected anywhere along the spine and from the neck to the low back depending upon the location of your pain.   After numbing the skin with local anesthetic (like Novocaine), a small needle is passed into the epidural space slowly.  You may experience a sensation of pressure while this is being done.  The entire block usually last less than 10 minutes.  Conditions which may be treated by epidural steroids:  Low back and leg pain Neck and arm pain Spinal stenosis Post-laminectomy syndrome Herpes zoster (shingles) pain Pain from compression fractures  Preparation for the injection:  Do not eat any solid food or dairy products within 8 hours of your appointment.  You may drink clear liquids up to 3 hours before appointment.  Clear liquids include water, black coffee, juice or soda.  No milk or cream please. You may take your regular medication, including pain medications, with a sip of water before your  appointment  Diabetics should hold regular insulin (if taken separately) and take 1/2 normal NPH dos the morning of the procedure.  Carry some sugar containing items with you to your appointment. A driver must accompany you and be prepared to drive you home after your procedure.  Bring all your current medications with your. An IV may be inserted and sedation may be given at the discretion of the physician.   A blood pressure cuff, EKG and other monitors will often be applied during the procedure.  Some patients may need to have extra oxygen administered for a short period. You will be asked to provide medical information, including your allergies, prior to the procedure.  We must know immediately if you are taking blood thinners (like Coumadin/Warfarin)  Or if you are allergic to IV iodine contrast (dye). We must know if you could possible be pregnant.  Possible side-effects: Bleeding from needle site Infection (rare, may require surgery) Nerve injury (rare) Numbness & tingling (temporary) Difficulty urinating (rare, temporary) Spinal headache ( a headache worse with upright posture) Light -headedness (temporary) Pain at injection site (several days) Decreased blood pressure (temporary) Weakness in arm/leg (temporary) Pressure sensation in back/neck (temporary)  Call if you experience: Fever/chills associated with headache or increased back/neck pain. Headache worsened by an upright position. New onset weakness or numbness of an extremity below the injection site Hives or difficulty breathing (go to the emergency room) Inflammation or drainage at the infection site Severe back/neck pain Any new symptoms which are concerning to you  Please note:  Although the local anesthetic injected can often make your back or neck feel good for several hours after the injection, the pain will likely return.  It takes 3-7 days for steroids to work in the epidural space.  You may not notice any pain  relief for at least that one week.  If effective, we will often do a series of three injections spaced 3-6 weeks apart to maximally decrease your pain.  After the initial series, we generally will wait several months before considering a repeat injection of the same type.  If you have any questions, please call [phone] [facility]

## 2021-05-27 NOTE — Progress Notes (Signed)
PROVIDER NOTE: Information contained herein reflects review and annotations entered in association with encounter. Interpretation of such information and data should be left to medically-trained personnel. Information provided to patient can be located elsewhere in the medical record under "Patient Instructions". Document created using STT-dictation technology, any transcriptional errors that may result from process are unintentional.    Patient: Kristin [name]  Service Category: E/M  Provider: Gaspar Cola, MD  DOB: 04-Feb-1930  DOS: 05/27/2021  Specialty: Interventional Pain Management  MRN: [id number]  Setting: Ambulatory outpatient  PCP: Olin Hauser, DO  Type: Established Patient    Referring Provider: [name] *  Location: Office  Delivery: Face-to-face     HPI  [name], a 87 y.o. year old female, is here today because of her Chronic bilateral low back pain with bilateral sciatica [M54.42, M54.41, G89.29]. Ms. Gayheart primary complain today is Back Pain Last encounter: My last encounter with her was on 03/24/2021. Pertinent problems: Ms. 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); Epidural fibrosis; Lumbar foraminal stenosis (Severe Left L4-5); Chronic sacroiliac joint pain (Left); Osteoarthritis involving multiple joints; Chronic neck pain; Chronic hip pain (Bilateral); Chronic cervical radicular pain (3ry area of  Pain) (Bilateral) (L>R); Chronic lower extremity pain (2ry area of Pain) (Bilateral) (L>R); DDD (degenerative disc disease), cervical; Numbness and tingling of upper extremity (Bilateral); Chronic shoulder pain, Radicular (Bilateral) (L>R); Cervical facet syndrome (Bilateral) (L>R); Cervical (3-4 mm) Anterolisthesis of C4 on C5; Cervical foraminal stenosis (Bilateral); Spondylosis without myelopathy or radiculopathy, lumbosacral region; Chronic shoulders pain (Bilateral) (L>R); Osteoarthritis of shoulder (Bilateral); Osteoarthritis of hip (Bilateral); Chronic shoulder pain after shoulder replacement (Bilateral) (L>R); Trigger finger, left middle finger; Cervicalgia; Greater trochanteric bursitis of hip (Left); Other specified dorsopathies, sacral and sacrococcygeal region; Chronic hip pain (Left); Osteoarthritis of hip (Left); Abnormal MRI, lumbar spine (11/03/2019); Chronic sacroiliac pain (Bilateral); Osteoarthritis of sacroiliac joints (Bilateral) ([place]); and Chronic midline low back pain without sciatica on their pertinent problem list. Pain Assessment: Severity of Chronic pain is reported as a 5 /10. Location: Back Right, Left/pain radiaties down her back to her left hip. Onset: More than a month ago. Quality: Aching, Throbbing, Constant, Shooting. Timing: Constant. Modifying factor(s): meds, heat and ice at times. Vitals:  height is _0  (1.727 m) and weight is 150 lb (68 kg). Her temperature is 97.1 F (36.2 C) (abnormal). Her blood pressure is 145/89 (abnormal) and her pulse is 81. Her respiration is 15 and oxygen saturation is 93%.   Reason for encounter: medication management.   The patient indicates no adverse reactions or side effects reported to the medications.  However, she also refers that she is having quite a bit of pain in the lower back and the medication does not seem to be covering the pain.  The patient came into the clinic today with her granddaughter, who with the caregiver.  I tend to  rely more on the reports from the granddaughter since she is more objective.  She confirmed that she has been having more problems moving around and has been  complaining of pain more often.  Because of this, we have decided to change her medications from the oxycodone IR 10 mg p.o. twice daily to the 5 mg pills which will allow [name] to move the medicine around a little bit better.  We will be having the patient take 10 mg twice daily and 5 mg in the afternoon, or wherever they feel it is more needed.  This will also allow her to use this as a PRN rescue dose.  In addition to this, because the patient has been having more pain in the lower back, we will be bringing her in for a possible lumbar epidural steroid injection.  This should help bring the pain down to a level where the medications may be able to control it better.  Since she is on Eliquis, we will have her stop it for 3 days prior to the procedure.  The plan was shared with the patient and her granddaughter who understood and agree.  RTCB: 08/25/2021 Nonopioids transferred 06/04/2020: Requip, MiraLAX, Dulcolax, and melatonin  Pharmacotherapy Assessment  Analgesic: Oxycodone IR 10 mg, 1 tab PO BID (20 mg/day of oxycodone) MME/day: 30 mg/day.   Monitoring: [place] PMP: PDMP reviewed during this encounter.       Pharmacotherapy: No side-effects or adverse reactions reported. Compliance: No problems identified. Effectiveness: Clinically acceptable.  Chauncey Fischer, RN  05/27/2021  2:49 PM  Sign when Signing Visit Nursing Pain Medication Assessment:  Safety precautions to be maintained throughout the outpatient stay will include: orient to surroundings, keep bed in low position, maintain call bell within reach at all times, provide assistance with transfer out of bed and ambulation.  Medication Inspection Compliance: Pill count conducted under aseptic conditions, in front of the patient. Neither the pills nor the bottle was removed from the patient's  sight at any time. Once count was completed pills were immediately returned to the patient in their original bottle.  Medication: Oxycodone IR Pill/Patch Count:  14 of 60 pills remain Pill/Patch Appearance: Markings consistent with prescribed medication Bottle Appearance: Standard pharmacy container. Clearly labeled. Filled Date: 95 / 10 / 2022 Last Medication intake:  Today Safety precautions to be maintained throughout the outpatient stay will include: orient to surroundings, keep bed in low position, maintain call bell within reach at all times, provide assistance with transfer out of bed and ambulation.      UDS:  Summary  Date Value Ref Range Status  03/04/2021 Note  Final    Comment:    ==================================================================== [employer] [facility] 13 (MW) ==================================================================== Test                             Result       Flag       Units  Drug Present and Declared for Prescription Verification   Oxymorphone                    270          EXPECTED   ng/mg creat   Noroxycodone                   317          EXPECTED   ng/mg creat    Oxymorphone and noroxycodone are expected metabolites of oxycodone.    Sources of oxycodone are scheduled prescription medications.    Oxymorphone is also available as a scheduled prescription medication.  Drug Absent but Declared  for Prescription Verification   Oxycodone                      Not Detected UNEXPECTED ng/mg creat    Oxycodone is almost always present in patients taking this drug    consistently.  Absence of oxycodone could be due to lapse of time    since the last dose or unusual pharmacokinetics (rapid metabolism).  ==================================================================== Test                      Result    Flag   Units      Ref Range   Creatinine              30               mg/dL       >=20 ==================================================================== Declared Medications:  The flagging and interpretation on this report are based on the  following declared medications.  Unexpected results may arise from  inaccuracies in the declared medications.   **Note: The testing scope of this panel includes these medications:   Oxycodone   **Note: The testing scope of this panel does not include the  following reported medications:   Acetaminophen  Albuterol  Apixaban (Eliquis)  Aspirin  Atorvastatin  Bisacodyl  Dextromethorphan (Mucinex DM)  Furosemide  Guaifenesin (Mucinex DM)  Helium  Levocetirizine (Xyzal)  Lisinopril  Melatonin  Oxygen  Ropinirole  Solifenacin (Vesicare)  Supplement (Fiber)  Tiotropium  Topical Lidocaine  Umeclidinium (Anoro)  Vilanterol (Anoro) ==================================================================== For clinical consultation, please call [phone]. ====================================================================      ROS  Constitutional: Denies any fever or chills Gastrointestinal: No reported hemesis, hematochezia, vomiting, or acute GI distress Musculoskeletal: Denies any acute onset joint swelling, redness, loss of ROM, or weakness Neurological: No reported episodes of acute onset apraxia, aphasia, dysarthria, agnosia, amnesia, paralysis, loss of coordination, or loss of consciousness  Medication Review  Melatonin, Oxygen-Helium, Vitamin D, acetaminophen, albuterol, apixaban, aspirin EC, atorvastatin, bisacodyl, gabapentin, levocetirizine, oxyCODONE, rOPINIRole, and umeclidinium-vilanterol  History Review  Allergy: Ms. Manzer is allergic to flexeril [cyclobenzaprine hcl] and vancomycin. Drug: Ms. Burich  reports current drug use. Drug: Oxycodone. Alcohol:  reports no history of alcohol use. Tobacco:  reports that she quit smoking about 22 years ago. Her smoking use included cigarettes. She has a 50.00  pack-year smoking history. She has quit using smokeless tobacco. Social: Ms. Becvar  reports that she quit smoking about 22 years ago. Her smoking use included cigarettes. She has a 50.00 pack-year smoking history. She has quit using smokeless tobacco. She reports current drug use. Drug: Oxycodone. She reports that she does not drink alcohol. Medical:  has a past medical history of Acute postoperative pain (02/09/2018), Arrhythmia, sinus node (04/03/2014), Arthritis, Arthritis, Atrial fibrillation ([place]), Back pain, Bradycardia, Breast cancer ([place]) (2004), Cancer ([place]) (2004), CHF (congestive heart failure) ([place]), Chronic back pain, CKD (chronic kidney disease), COPD (chronic obstructive pulmonary disease) ([place]), Cystocele, Decubitus ulcers, Dehydration, Gout, Hematuria, Hepatitis C, Hyperlipidemia, Hypertension, Hypomagnesemia (06/25/2015), OAB (overactive bladder), Overactive bladder, Restless leg, Shoulder pain, bilateral, Urinary frequency, UTI (lower urinary tract infection), and Vaginal atrophy. Surgical: Ms. Carignan  has a past surgical history that includes Abdominal hysterectomy; Back surgery; Rotator cuff repair; Breast surgery; Cataract extraction w/PHACO (10/19/2011); Cataract extraction w/PHACO (11/02/2011); Carpal tunnel release; Joint replacement; Colon surgery; Pacemaker insertion (N/A, 12/24/2015); Cholecystectomy; Breast lumpectomy (Right, 2004); Insert / replace / remove pacemaker; and Lower Extremity Angiography (Left, 10/14/2020). Family:  family history includes Breast cancer in her paternal aunt; Breast cancer (age of onset: 28) in her sister; Cancer in her sister; Heart disease in her father and mother; Kidney disease in her brother; Stroke in her mother.  Laboratory Chemistry Profile   Renal Lab Results  Component Value Date   BUN 60 (H) 04/08/2021   CREATININE 1.70 (H) 04/08/2021   BCR 40 (H) 04/02/2021   GFRAA 52 (L) 10/02/2020   GFRNONAA 28 (L) 04/08/2021    Hepatic Lab Results   Component Value Date   AST 21 11/07/2020   ALT 16 11/07/2020   ALBUMIN 3.7 11/07/2020   ALKPHOS 55 11/07/2020    Electrolytes Lab Results  Component Value Date   NA 138 04/08/2021   K 5.0 04/08/2021   CL 112 (H) 04/08/2021   CALCIUM 9.3 04/08/2021   MG 2.0 11/07/2020   PHOS 3.0 08/05/2020    Bone Lab Results  Component Value Date   VD25OH 31 04/01/2020    Inflammation (CRP: Acute Phase) (ESR: Chronic Phase) Lab Results  Component Value Date   CRP <0.5 09/24/2015   ESRSEDRATE 12 09/24/2015   LATICACIDVEN 1.8 08/02/2020         Note: Above Lab results reviewed.  Recent Imaging Review  DG Lumbar Spine 2-3 Views CLINICAL DATA:  Low back pain.  EXAM: LUMBAR SPINE - 2-3 VIEW  COMPARISON:  February 25, 2018  FINDINGS: There is no evidence of acute lumbar spine fracture. There is moderate to marked severity levoscoliosis. Marked severity multilevel endplate sclerosis is seen with marked severity multilevel intervertebral disc space narrowing.  IMPRESSION: 1. Moderate to marked severity levoscoliosis with marked severity multilevel degenerative changes.  Electronically Signed   By: Virgina Norfolk M.D.   On: 04/08/2021 01:29 Note: Reviewed        Physical Exam  General appearance: Well nourished, well developed, and well hydrated. In no apparent acute distress Mental status: Alert, oriented x 3 (person, place, & time)       Respiratory: No evidence of acute respiratory distress Eyes: PERLA Vitals: BP (!) 145/89   Pulse 81   Temp (!) 97.1 F (36.2 C)   Resp 15   Ht _0  (1.727 m)   Wt 150 lb (68 kg)   SpO2 93%   BMI 22.81 kg/m  BMI: Estimated body mass index is 22.81 kg/m as calculated from the following:   Height as of this encounter: _1  (1.727 m).   Weight as of this encounter: 150 lb (68 kg). Ideal: Ideal body weight: 63.9 kg (140 lb 14 oz) Adjusted ideal body weight: 65.6 kg (144 lb 8.4 oz)  Assessment   Status Diagnosis   Controlled Controlled Controlled 1. Chronic low back pain (1ry area of Pain) (Bilateral) (L>R) w/ sciatica (Bilateral)   2. Chronic lower extremity pain (2ry area of Pain) (Bilateral) (L>R)   3. Chronic cervical radicular pain (3ry area of Pain) (Bilateral) (L>R)   4. Lumbar facet syndrome (Bilateral) (L>R)   5. Lumbar lateral recess stenosis (L1-2 and L4-5)   6. Chronic neck pain   7. Chronic hip pain (Bilateral)   8. Cervical facet syndrome (Bilateral) (L>R)   9. Chronic sacroiliac pain (Bilateral)   10. Chronic shoulders pain (Bilateral) (L>R)   11. 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 Chronic pain syndrome   13. Pharmacologic therapy   14. Chronic use of opiate for therapeutic purpose   15. Encounter for medication management   16.  Chronic anticoagulation (Eliquis)   17. Abnormal MRI, lumbar spine (11/03/2019)      Updated Problems: Problem  Atherosclerosis of Native Arteries of The Extremities With Ulceration ([facility])    Plan of Care  Problem-specific:  No problem-specific Assessment & Plan notes found for this encounter.  Ms. SEAIRRA OTANI has a current medication list which includes the following long-term medication(s): albuterol, apixaban, atorvastatin, bisacodyl, gabapentin, levocetirizine, oxycodone, [START ON 06/26/2021] oxycodone, [START ON 07/26/2021] oxycodone, and ropinirole.  Pharmacotherapy (Medications Ordered): Meds ordered this encounter  Medications   oxyCODONE (OXY IR/ROXICODONE) 5 MG immediate release tablet    Sig: Take 2 tablets (10 mg total) by mouth 2 (two) times daily. May also take 1 tablet (5 mg total) at bedtime as needed for severe pain. Must last 30 days..    Dispense:  150 tablet    Refill:  0    Not a duplicate. Do NOT delete! Dispense 1 day early if closed on fill date. Warn: no CNS-depressants within 8 hrs of opioid. Do not send refill request. Renewal requires appointment. No partial fills allowed    oxyCODONE (OXY IR/ROXICODONE) 5 MG immediate release tablet    Sig: Take 2 tablets (10 mg total) by mouth 2 (two) times daily. May also take 1 tablet (5 mg total) at bedtime as needed for severe pain. Must last 30 days..    Dispense:  150 tablet    Refill:  0    Not a duplicate. Do NOT delete! Dispense 1 day early if closed on fill date. Warn: no CNS-depressants within 8 hrs of opioid. Do not send refill request. Renewal requires appointment. No partial fills allowed   oxyCODONE (OXY IR/ROXICODONE) 5 MG immediate release tablet    Sig: Take 2 tablets (10 mg total) by mouth 2 (two) times daily. May also take 1 tablet (5 mg total) at bedtime as needed for severe pain. Must last 30 days..    Dispense:  150 tablet    Refill:  0    Not a duplicate. Do NOT delete! Dispense 1 day early if closed on fill date. Warn: no CNS-depressants within 8 hrs of opioid. Do not send refill request. Renewal requires appointment. No partial fills allowed    Orders:  Orders Placed This Encounter  Procedures   Lumbar Epidural Injection    Standing Status:   Future    Standing Expiration Date:   08/27/2021    Scheduling Instructions:     Procedure: Interlaminar Lumbar Epidural Steroid injection (LESI)  L1-2     Laterality: Left-sided     Sedation: Patient's choice.     Timeframe: ASAA    Order Specific Question:   Where will this procedure be performed?    Answer:   [facility]   Blood Thinner Instructions to Nursing    Always make sure patient has clearance from prescribing physician to stop blood thinners for interventional therapies. If the patient requires a Lovenox-bridge therapy, make sure arrangements are made to institute it with the assistance of the PCP.    Scheduling Instructions:     Have Ms. Mittag stop the Eliquis (Apixaban) x 3 days prior to procedure or surgery.    Follow-up plan:   Return for (Clinic) procedure: (L) LESI, (NS), (Blood Thinner Protocol).     Interventional Therapies   Risk  Complexity Considerations:   NOTE: ELIQUIS ANTICOAGULATION (Stop: x3 days prior to procedure  Restart 6 hours after procedures)   Planned  Pending:   Pending further  evaluation   Under consideration:   Diagnostic right SI joint block  Possible bilateral SI joint RFA  Diagnostic bilateral femoral nerve + obturator NB  Possible bilateral femoral nerve + obturator nerve RFA  Diagnostic right IA shoulder joint injection     Completed:   Diagnostic/therapeutic right L2 TFESI x1 (01/31/2020)  Diagnostic/therapeutic left L4 TFESI x1 (01/31/2020) Diagnostic left SI joint block x2 (09/27/2019) Palliative left CESI x2 (10/13/2017) Palliative left trochanteric bursa injection x1 (03/27/2019)  Palliative right IA hip inj. x1  (100% on the right) (02/08/2019) Palliative left IA hip inj. x2 (02/08/2019) Palliative right lumbar facet MBB x2 (01/10/2020) Palliative left lumbar facet MBB x4 (01/10/2020) Palliative right lumbar facet RFA x1 (09/19/18) Palliative left lumbar facet RFA x3 (08/01/18) Diagnostic/therapeutic right suprascapular NB x3 (05/10/2017)  Palliative bilateral suprascapular nerve RFA x1 (07/13/18)   Therapeutic  Palliative (PRN) options:   Diagnostic left SI joint block #2  Palliative left CESI #3  Palliative left trochanteric bursa injection #2  Palliative right IA hip inj. #2  (100% on the right) Palliative left IA hip inj. #3   Palliative right lumbar facet block #3  Palliative left lumbar facet block #5  Palliative right lumbar facet RFA #2  Palliative left lumbar facet RFA #4  Palliative bilateral suprascapular nerve block   Palliative bilateral suprascapular nerve RFA #2        Recent Visits Date Type Provider Dept  04/07/21 Telemedicine Milinda Pointer, MD [facility]  03/24/21 Procedure visit Milinda Pointer, MD [facility]  03/04/21 Office Visit Milinda Pointer, MD [facility]  Showing recent visits within past 90 days  and meeting all other requirements Today's Visits Date Type Provider Dept  05/27/21 Office Visit Milinda Pointer, MD [facility]  Showing today's visits and meeting all other requirements Future Appointments Date Type Provider Dept  08/19/21 Appointment Milinda Pointer, MD [facility]  Showing future appointments within next 90 days and meeting all other requirements I discussed the assessment and treatment plan with the patient. The patient was provided an opportunity to ask questions and all were answered. The patient agreed with the plan and demonstrated an understanding of the instructions.  Patient advised to call back or seek an in-person evaluation if the symptoms or condition worsens.  Duration of encounter: 30 minutes.  Note by: Gaspar Cola, MD Date: 05/27/2021; Time: 6:31 PM

## 2021-05-27 NOTE — Progress Notes (Signed)
Nursing Pain Medication Assessment:  Safety precautions to be maintained throughout the outpatient stay will include: orient to surroundings, keep bed in low position, maintain call bell within reach at all times, provide assistance with transfer out of bed and ambulation.  Medication Inspection Compliance: Pill count conducted under aseptic conditions, in front of the patient. Neither the pills nor the bottle was removed from the patient's sight at any time. Once count was completed pills were immediately returned to the patient in their original bottle.  Medication: Oxycodone IR Pill/Patch Count:  14 of 60 pills remain Pill/Patch Appearance: Markings consistent with prescribed medication Bottle Appearance: Standard pharmacy container. Clearly labeled. Filled Date: 28 / 10 / 2022 Last Medication intake:  Today Safety precautions to be maintained throughout the outpatient stay will include: orient to surroundings, keep bed in low position, maintain call bell within reach at all times, provide assistance with transfer out of bed and ambulation.

## 2021-06-02 ENCOUNTER — Encounter: Payer: Self-pay | Admitting: Family Medicine

## 2021-06-03 ENCOUNTER — Other Ambulatory Visit (INDEPENDENT_AMBULATORY_CARE_PROVIDER_SITE_OTHER): Payer: Self-pay | Admitting: Vascular Surgery

## 2021-06-03 DIAGNOSIS — Z95820 Peripheral vascular angioplasty status with implants and grafts: Secondary | ICD-10-CM

## 2021-06-04 ENCOUNTER — Ambulatory Visit (INDEPENDENT_AMBULATORY_CARE_PROVIDER_SITE_OTHER): Payer: Medicare HMO | Admitting: Nurse Practitioner

## 2021-06-04 ENCOUNTER — Ambulatory Visit (INDEPENDENT_AMBULATORY_CARE_PROVIDER_SITE_OTHER): Payer: Medicare HMO

## 2021-06-04 ENCOUNTER — Encounter (INDEPENDENT_AMBULATORY_CARE_PROVIDER_SITE_OTHER): Payer: Self-pay | Admitting: Nurse Practitioner

## 2021-06-04 ENCOUNTER — Other Ambulatory Visit: Payer: Self-pay

## 2021-06-04 VITALS — BP 120/70 | HR 80 | Resp 16 | Wt 151.4 lb

## 2021-06-04 DIAGNOSIS — I890 Lymphedema, not elsewhere classified: Secondary | ICD-10-CM | POA: Diagnosis not present

## 2021-06-04 DIAGNOSIS — I7025 Atherosclerosis of native arteries of other extremities with ulceration: Secondary | ICD-10-CM | POA: Diagnosis not present

## 2021-06-04 DIAGNOSIS — Z95820 Peripheral vascular angioplasty status with implants and grafts: Secondary | ICD-10-CM

## 2021-06-09 ENCOUNTER — Encounter: Payer: Self-pay | Admitting: Pain Medicine

## 2021-06-09 ENCOUNTER — Ambulatory Visit
Admission: RE | Admit: 2021-06-09 | Discharge: 2021-06-09 | Disposition: A | Discharge status: Home / Self Care | Payer: Medicare HMO | Source: Ambulatory Visit | Attending: Pain Medicine | Admitting: Pain Medicine

## 2021-06-09 ENCOUNTER — Other Ambulatory Visit: Payer: Self-pay

## 2021-06-09 ENCOUNTER — Telehealth (INDEPENDENT_AMBULATORY_CARE_PROVIDER_SITE_OTHER): Payer: Self-pay

## 2021-06-09 ENCOUNTER — Encounter (INDEPENDENT_AMBULATORY_CARE_PROVIDER_SITE_OTHER): Payer: Self-pay | Admitting: Nurse Practitioner

## 2021-06-09 ENCOUNTER — Ambulatory Visit (HOSPITAL_BASED_OUTPATIENT_CLINIC_OR_DEPARTMENT_OTHER): Payer: Medicare HMO | Admitting: Pain Medicine

## 2021-06-09 VITALS — BP 171/96 | HR 64 | Temp 96.9°F | Resp 17 | Ht 67.0 in | Wt 150.0 lb

## 2021-06-09 DIAGNOSIS — M79605 Pain in left leg: Secondary | ICD-10-CM | POA: Diagnosis present

## 2021-06-09 DIAGNOSIS — M25559 Pain in unspecified hip: Secondary | ICD-10-CM

## 2021-06-09 DIAGNOSIS — M79604 Pain in right leg: Secondary | ICD-10-CM

## 2021-06-09 DIAGNOSIS — G8929 Other chronic pain: Secondary | ICD-10-CM | POA: Diagnosis not present

## 2021-06-09 DIAGNOSIS — M5441 Lumbago with sciatica, right side: Secondary | ICD-10-CM | POA: Insufficient documentation

## 2021-06-09 DIAGNOSIS — M5416 Radiculopathy, lumbar region: Secondary | ICD-10-CM | POA: Insufficient documentation

## 2021-06-09 DIAGNOSIS — Z7901 Long term (current) use of anticoagulants: Secondary | ICD-10-CM

## 2021-06-09 DIAGNOSIS — M5136 Other intervertebral disc degeneration, lumbar region: Secondary | ICD-10-CM | POA: Diagnosis not present

## 2021-06-09 DIAGNOSIS — M4608 Spinal enthesopathy, sacral and sacrococcygeal region: Secondary | ICD-10-CM | POA: Diagnosis not present

## 2021-06-09 DIAGNOSIS — M5442 Lumbago with sciatica, left side: Secondary | ICD-10-CM | POA: Insufficient documentation

## 2021-06-09 DIAGNOSIS — M4807 Spinal stenosis, lumbosacral region: Secondary | ICD-10-CM | POA: Diagnosis not present

## 2021-06-09 DIAGNOSIS — M961 Postlaminectomy syndrome, not elsewhere classified: Secondary | ICD-10-CM | POA: Insufficient documentation

## 2021-06-09 DIAGNOSIS — M48061 Spinal stenosis, lumbar region without neurogenic claudication: Secondary | ICD-10-CM

## 2021-06-09 DIAGNOSIS — M5137 Other intervertebral disc degeneration, lumbosacral region: Secondary | ICD-10-CM | POA: Diagnosis not present

## 2021-06-09 MED ORDER — TRIAMCINOLONE ACETONIDE 40 MG/ML IJ SUSP
INTRAMUSCULAR | Status: AC
  Filled 2021-06-09: qty 1

## 2021-06-09 MED ORDER — ROPIVACAINE HCL 2 MG/ML IJ SOLN
2.0000 mL | Freq: Once | INTRAMUSCULAR | Status: AC
  Administered 2021-06-09: 2 mL via EPIDURAL

## 2021-06-09 MED ORDER — MIDAZOLAM HCL 5 MG/5ML IJ SOLN
INTRAMUSCULAR | Status: AC
  Filled 2021-06-09: qty 5

## 2021-06-09 MED ORDER — LIDOCAINE HCL 2 % IJ SOLN
20.0000 mL | Freq: Once | INTRAMUSCULAR | Status: AC
  Administered 2021-06-09: 400 mg

## 2021-06-09 MED ORDER — TRIAMCINOLONE ACETONIDE 40 MG/ML IJ SUSP
40.0000 mg | Freq: Once | INTRAMUSCULAR | Status: AC
  Administered 2021-06-09: 40 mg

## 2021-06-09 MED ORDER — LIDOCAINE HCL 2 % IJ SOLN
INTRAMUSCULAR | Status: AC
  Filled 2021-06-09: qty 10

## 2021-06-09 MED ORDER — SODIUM CHLORIDE (PF) 0.9 % IJ SOLN
INTRAMUSCULAR | Status: AC
  Filled 2021-06-09: qty 10

## 2021-06-09 MED ORDER — SODIUM CHLORIDE 0.9% FLUSH
2.0000 mL | Freq: Once | INTRAVENOUS | Status: AC
Start: 2021-06-09 — End: 2021-06-09
  Administered 2021-06-09: 2 mL

## 2021-06-09 MED ORDER — IOHEXOL 180 MG/ML  SOLN
10.0000 mL | Freq: Once | INTRAMUSCULAR | Status: AC
  Administered 2021-06-09: 10 mL via EPIDURAL

## 2021-06-09 MED ORDER — ROPIVACAINE HCL 2 MG/ML IJ SOLN
INTRAMUSCULAR | Status: AC
  Filled 2021-06-09: qty 20

## 2021-06-09 NOTE — Telephone Encounter (Signed)
Home health orders were faxed to [facility] to start left unna wrap.

## 2021-06-09 NOTE — Progress Notes (Signed)
Subjective:    [name], female    DOB: 1929-10-30, 87 y.o.   MRN: [id number] Chief Complaint  Patient presents with   Follow-up    Ultrasound follow up    Leg raise is an 87 year old female that presents today for follow-up evaluation of her PAD.  She denies any significant claudication-like symptoms.  She does have some lower extremity pain due to renal lower back issues.  She denies any fevers or chills.  She notes that the wound that was on her leg and previously healed by wound care has seemed to reemerge.  However this wound is very superficial in nature.  Patient also notes that she has swelling in this left lower extremity as well.  She denies any fevers or chills.  There are no other wounds or ulcerations.  No symptoms or rest pain.  Today noninvasive studies show an ABI 0.88 on the right with an ABI of 1.28 on the left.  Previously the ABI was 0.91 on the right and noncompressible on the left.  The patient has monophasic tibial artery waveforms along with good toe waveforms bilaterally.   Review of Systems  Cardiovascular:  Positive for leg swelling.  Skin:  Positive for wound.  All other systems reviewed and are negative.     Objective:   Physical Exam Vitals reviewed.  HENT:     Head: Normocephalic.  Cardiovascular:     Rate and Rhythm: Normal rate.     Pulses: Decreased pulses.  Pulmonary:     Effort: Pulmonary effort is normal.  Musculoskeletal:     Left lower leg: 2+ Edema present.  Skin:    General: Skin is warm and dry.  Neurological:     Mental Status: She is alert and oriented to person, place, and time.  Psychiatric:        Mood and Affect: Mood normal.        Behavior: Behavior normal.        Thought Content: Thought content normal.        Judgment: Judgment normal.    BP 120/70 (BP Location: Right Arm)   Pulse 80   Resp 16   Wt 151 lb 6.4 oz (68.7 kg)   BMI 23.02 kg/m   Past Medical History:  Diagnosis Date   Acute  postoperative pain 02/09/2018   Arrhythmia, sinus node 04/03/2014   Arthritis    Arthritis    Atrial fibrillation (HCC)    Back pain    lower back chronic   Bradycardia    Breast cancer ([place]) 2004   right breast cancer   Cancer ([place]) 2004   rt breast cancer-post lumpectomy- chemo/rad   CHF (congestive heart failure) (HCC)    Chronic back pain    CKD (chronic kidney disease)    COPD (chronic obstructive pulmonary disease) ([place])    wears O2 at 2L via [place] at night   Cystocele    Decubitus ulcers    Dehydration    Gout    Hematuria    Hepatitis C    Hyperlipidemia    Hypertension    Hypomagnesemia 06/25/2015   OAB (overactive bladder)    Overactive bladder    Restless leg    Shoulder pain, bilateral    Urinary frequency    UTI (lower urinary tract infection)    Vaginal atrophy     Social History   Socioeconomic History   Marital status: Single    Spouse name: Not on file  Number of children: 2   Years of education: 12   Highest education level: 12th grade  Occupational History   Occupation: retired  Tobacco Use   Smoking status: Former    Packs/day: 1.25    Years: 40.00    Pack years: 50.00    Types: Cigarettes    Quit date: 12/16/1998    Years since quitting: 22.4   Smokeless tobacco: Former  Scientific laboratory technician Use: Never used  Substance and Sexual Activity   Alcohol use: No    Alcohol/week: 0.0 standard drinks   Drug use: Yes    Types: Oxycodone   Sexual activity: Not Currently    Birth control/protection: Surgical  Other Topics Concern   Not on file  Social History Narrative   Not on file   Social Determinants of Health   Financial Resource Strain: Not on file  Food Insecurity: No Food Insecurity   Worried About Charity fundraiser in the Last Year: Never true   [place] in the Last Year: Never true  Transportation Needs: No Transportation Needs   Lack of Transportation (Medical): No   Lack of Transportation (Non-Medical): No  Physical  Activity: Not on file  Stress: Not on file  Social Connections: Not on file  Intimate Partner Violence: Not on file    Past Surgical History:  Procedure Laterality Date   ABDOMINAL HYSTERECTOMY     BACK SURGERY     BREAST LUMPECTOMY Right 2004   BREAST SURGERY     rt lumpectomy   CARPAL TUNNEL RELEASE     CATARACT EXTRACTION W/PHACO  10/19/2011   Procedure: CATARACT EXTRACTION PHACO AND INTRAOCULAR LENS PLACEMENT (IOC);  Surgeon: Elta Guadeloupe T. Gershon Crane, MD;  Location: [facility];  Service: Ophthalmology;  Laterality: Right;  CDE:10.81   CATARACT EXTRACTION W/PHACO  11/02/2011   Procedure: CATARACT EXTRACTION PHACO AND INTRAOCULAR LENS PLACEMENT (IOC);  Surgeon: Elta Guadeloupe T. Gershon Crane, MD;  Location: [facility];  Service: Ophthalmology;  Laterality: Left;  CDE 8.60   CHOLECYSTECTOMY     3/18   COLON SURGERY     INSERT / REPLACE / REMOVE PACEMAKER     JOINT REPLACEMENT     bilateral TKA   LOWER EXTREMITY ANGIOGRAPHY Left 10/14/2020   Procedure: LOWER EXTREMITY ANGIOGRAPHY;  Surgeon: Katha Cabal, MD;  Location: [facility];  Service: Cardiovascular;  Laterality: Left;   PACEMAKER INSERTION N/A 12/24/2015   Procedure: INSERTION PACEMAKER;  Surgeon: Isaias Cowman, MD;  Location: [facility];  Service: Cardiovascular;  Laterality: N/A;   ROTATOR CUFF REPAIR     bilateral    Family History  Problem Relation Age of Onset   Kidney disease Brother    Heart disease Father    Heart disease Mother    Stroke Mother    Cancer Sister    Breast cancer Sister 90   Breast cancer Paternal Aunt    Anesthesia problems Neg Hx    Hypotension Neg Hx    Malignant hyperthermia Neg Hx    Pseudochol deficiency Neg Hx     Allergies  Allergen Reactions   Flexeril [Cyclobenzaprine Hcl]     Severe Rash   Vancomycin Rash    [place] Syndrome    CBC Latest Ref Rng & Units 04/08/2021 04/07/2021 04/02/2021  WBC 4.0 - 10.5 K/uL 9.5 9.6 9.9  Hemoglobin 12.0 - 15.0 g/dL 12.8 12.6 13.2  Hematocrit 36.0 - 46.0  % 40.2 39.3 41.8  Platelets 150 - 400 K/uL  138(L) 152 184      CMP     Component Value Date/Time   NA 138 04/08/2021 0805   NA 138 09/06/2014 2215   K 5.0 04/08/2021 0805   K 4.4 09/06/2014 2215   CL 112 (H) 04/08/2021 0805   CL 106 09/06/2014 2215   CO2 19 (L) 04/08/2021 0805   CO2 23 09/06/2014 2215   GLUCOSE 73 04/08/2021 0805   GLUCOSE 92 09/06/2014 2215   BUN 60 (H) 04/08/2021 0805   BUN 25 (H) 09/06/2014 2215   CREATININE 1.70 (H) 04/08/2021 0805   CREATININE 1.40 (H) 04/02/2021 0916   CALCIUM 9.3 04/08/2021 0805   CALCIUM 9.0 09/06/2014 2215   PROT 7.0 11/07/2020 2002   PROT 7.1 07/10/2014 1219   ALBUMIN 3.7 11/07/2020 2002   ALBUMIN 3.2 (L) 07/10/2014 1219   AST 21 11/07/2020 2002   AST 26 07/10/2014 1219   ALT 16 11/07/2020 2002   ALT 15 07/10/2014 1219   ALKPHOS 55 11/07/2020 2002   ALKPHOS 69 07/10/2014 1219   BILITOT 0.6 11/07/2020 2002   BILITOT 0.4 07/10/2014 1219   GFRNONAA 28 (L) 04/08/2021 0805   GFRNONAA 45 (L) 10/02/2020 1106   GFRAA 52 (L) 10/02/2020 1106     No results found.     Assessment & Plan:   1. Atherosclerosis of native arteries of the extremities with ulceration ([place]) The patient has a wound that has been difficult to heal.  Showed wound.  Wound healed however has subsequently reopened.  It is very shallow in nature.  I have discussed angiogram on multiple occasions with the patient however she is not quite ready to proceed.  We will try to utilize the wraps on the patient to see if this will help with wound healing that is very superficial.  She will present to the office on a weekly basis for wrap changes.  We will reevaluate progress in 4 weeks.  2. Lymphedema Patient has lower extremity edema which may be complicating this wound healing.  The use of medical grade compression stockings when she is in out of the wraps was discussed as well as elevation and activity.   Current Outpatient Medications on File Prior to Visit   Medication Sig Dispense Refill   acetaminophen (TYLENOL) 500 MG tablet Take 500 mg by mouth in the morning and at bedtime.     albuterol (PROVENTIL) (2.5 MG/3ML) 0.083% nebulizer solution USE 1 VIAL IN NEBULIZER EVERY 8 HOURS AS NEEDED FOR WHEEZING OR SHORTNESS OF BREATH. 75 mL 3   apixaban (ELIQUIS) 2.5 MG TABS tablet Take 1 tablet (2.5 mg total) by mouth 2 (two) times daily. 60 tablet 0   aspirin EC 81 MG tablet Take 1 tablet (81 mg total) by mouth daily. Swallow whole. 150 tablet 2   atorvastatin (LIPITOR) 20 MG tablet TAKE 1 TABLET BY MOUTH ONCE DAILY FOR CHOLESTEROL 30 tablet 11   bisacodyl (DULCOLAX) 5 MG EC tablet Take 2 tablets (10 mg total) by mouth daily as needed for moderate constipation. 180 tablet 1   Cholecalciferol (VITAMIN D) 2000 units tablet Take 2,000 Units by mouth daily.      gabapentin (NEURONTIN) 100 MG capsule Take 3 capsules (300 mg total) by mouth at bedtime. 270 capsule 1   levocetirizine (XYZAL) 5 MG tablet TAKE (1/2) TABLET BY MOUTH EVERY OTHER DAY. 23 tablet 3   Melatonin 10 MG TABS Take 10 mg by mouth at bedtime.     oxyCODONE (OXY IR/ROXICODONE) 5 MG  immediate release tablet Take 2 tablets (10 mg total) by mouth 2 (two) times daily. May also take 1 tablet (5 mg total) at bedtime as needed for severe pain. Must last 30 days.. 150 tablet 0   [START ON 06/26/2021] oxyCODONE (OXY IR/ROXICODONE) 5 MG immediate release tablet Take 2 tablets (10 mg total) by mouth 2 (two) times daily. May also take 1 tablet (5 mg total) at bedtime as needed for severe pain. Must last 30 days.. 150 tablet 0   [START ON 07/26/2021] oxyCODONE (OXY IR/ROXICODONE) 5 MG immediate release tablet Take 2 tablets (10 mg total) by mouth 2 (two) times daily. May also take 1 tablet (5 mg total) at bedtime as needed for severe pain. Must last 30 days.. 150 tablet 0   OXYGEN Inhale 2 L into the lungs at bedtime.      rOPINIRole (REQUIP) 2 MG tablet TAKE 2 TABLETS BY MOUTH AT BEDTIME 180 tablet 1    umeclidinium-vilanterol (ANORO ELLIPTA) 62.5-25 MCG/INH AEPB Inhale 1 puff into the lungs daily.     No current facility-administered medications on file prior to visit.    There are no Patient Instructions on file for this visit. No follow-ups on file.   Kris Hartmann, NP

## 2021-06-09 NOTE — Patient Instructions (Signed)
____________________________________________________________________________________________  Virtual Visits   What is a "Virtual Visit"? It is a healthcare communication encounter (medical visit) that takes place on real time (NOT TEXT or E-MAIL) over the telephone or computer device (desktop, laptop, tablet, smart phone, etc.). It allows for more location flexibility between the patient and the healthcare provider.  Who decides when these types of visits will be used? The physician.  Who is eligible for these types of visits? Only those patients that can be reliably reached over the telephone.  What do you mean by reliably? We do not have time to call everyone multiple times, therefore those that tend to screen calls and then call back later are not suitable candidates for this system. We understand how people are reluctant to pickup on "unknown" calls, therefore, we suggest adding our telephone numbers to your list of "CONTACT(s)". This way, you should be able to readily identify our calls when you receive one. All of our numbers are available below.   Who is not eligible? This option is not available for medication management encounters, specially for controlled substances. Patients on pain medications that fall under the category of controlled substances have to come in for "Face-to-Face" encounters. This is required for mandatory monitoring of these substances. You may be asked to provide a sample for an unannounced urine drug screening test (UDS), and we will need to count your pain pills. Not bringing your pills to be counted may result in no refill. Obviously, neither one of these can be done over the phone.  When will this type of visits be used? You can request a virtual visit whenever you are physically unable to attend a regular appointment. The decision will be made by the physician (or healthcare provider) on a case by case basis.   At what time will I be called? This is an  excellent question. The providers will try to call you whenever they have time available. Do not expect to be called at any specific time. The secretaries will assign you a time for your virtual visit appointment, but this is done simply to keep a list of those patients that need to be called, but not for the purpose of keeping a time schedule. Be advised that the call may come in anytime during the day, between the hours of 8:00 AM and 8::00 PM, depending on provider availability. We do understand that the system is not perfect. If you are unable to be available that day on a moments notice, then request an "in-person" appointment rather than a "virtual visit".  Can I request my medication visits to be "Virtual"? Yes you may request it, but the decision is entirely up to the healthcare provider. Control substances require specific monitoring that requires Face-to-Face encounters. The number of encounters  and the extent of the monitoring is determined on a case by case basis.  Add a new contact to your smart phone and label it "PAIN CLINIC" Under this contact add the following numbers: Main: [phone] (Official Contact Number) Nurses: [phone] (These are outgoing only calling systems. Do not call this number.) Dr. Isabell Bonafede: [phone] or [phone] (Outgoing calls only. Do not call this number.)  ____________________________________________________________________________________________   ____________________________________________________________________________________________  Post-Procedure Discharge Instructions  Instructions: Apply ice:  Purpose: This will minimize any swelling and discomfort after procedure.  When: Day of procedure, as soon as you get home. How: Fill a plastic sandwich bag with crushed ice. Cover it with a small towel and apply to   injection site. How long: (15 min on, 15 min off) Apply for 15 minutes then remove x 15 minutes.  Repeat sequence on day  of procedure, until you go to bed. Apply heat:  Purpose: To treat any soreness and discomfort from the procedure. When: Starting the next day after the procedure. How: Apply heat to procedure site starting the day following the procedure. How long: May continue to repeat daily, until discomfort goes away. Food intake: Start with clear liquids (like water) and advance to regular food, as tolerated.  Physical activities: Keep activities to a minimum for the first 8 hours after the procedure. After that, then as tolerated. Driving: If you have received any sedation, be responsible and do not drive. You are not allowed to drive for 24 hours after having sedation. Blood thinner: (Applies only to those taking blood thinners) You may restart your blood thinner 6 hours after your procedure. Insulin: (Applies only to Diabetic patients taking insulin) As soon as you can eat, you may resume your normal dosing schedule. Infection prevention: Keep procedure site clean and dry. Shower daily and clean area with soap and water. Post-procedure Pain Diary: Extremely important that this be done correctly and accurately. Recorded information will be used to determine the next step in treatment. For the purpose of accuracy, follow these rules: Evaluate only the area treated. Do not report or include pain from an untreated area. For the purpose of this evaluation, ignore all other areas of pain, except for the treated area. After your procedure, avoid taking a long nap and attempting to complete the pain diary after you wake up. Instead, set your alarm clock to go off every hour, on the hour, for the initial 8 hours after the procedure. Document the duration of the numbing medicine, and the relief you are getting from it. Do not go to sleep and attempt to complete it later. It will not be accurate. If you received sedation, it is likely that you were given a medication that may cause amnesia. Because of this, completing  the diary at a later time may cause the information to be inaccurate. This information is needed to plan your care. Follow-up appointment: Keep your post-procedure follow-up evaluation appointment after the procedure (usually 2 weeks for most procedures, 6 weeks for radiofrequencies). DO NOT FORGET to bring you pain diary with you.   Expect: (What should I expect to see with my procedure?) From numbing medicine (AKA: Local Anesthetics): Numbness or decrease in pain. You may also experience some weakness, which if present, could last for the duration of the local anesthetic. Onset: Full effect within 15 minutes of injected. Duration: It will depend on the type of local anesthetic used. On the average, 1 to 8 hours.  From steroids (Applies only if steroids were used): Decrease in swelling or inflammation. Once inflammation is improved, relief of the pain will follow. Onset of benefits: Depends on the amount of swelling present. The more swelling, the longer it will take for the benefits to be seen. In some cases, up to 10 days. Duration: Steroids will stay in the system x 2 weeks. Duration of benefits will depend on multiple posibilities including persistent irritating factors. Side-effects: If present, they may typically last 2 weeks (the duration of the steroids). Frequent: Cramps (if they occur, drink Gatorade and take over-the-counter Magnesium 450-500 mg once to twice a day); water retention with temporary weight gain; increases in blood sugar; decreased immune system response; increased appetite. Occasional: Facial flushing (red,   warm cheeks); mood swings; menstrual changes. Uncommon: Long-term decrease or suppression of natural hormones; bone thinning. (These are more common with higher doses or more frequent use. This is why we prefer that our patients avoid having any injection therapies in other practices.)  Very Rare: Severe mood changes; psychosis; aseptic necrosis. From procedure: Some  discomfort is to be expected once the numbing medicine wears off. This should be minimal if ice and heat are applied as instructed.  Call if: (When should I call?) You experience numbness and weakness that gets worse with time, as opposed to wearing off. New onset bowel or bladder incontinence. (Applies only to procedures done in the spine)  Emergency Numbers: Durning business hours (Monday - Thursday, 8:00 AM - 4:00 PM) (Friday, 9:00 AM - 12:00 Noon): [phone] After hours: [phone] NOTE: If you are having a problem and are unable connect with, or to talk to a provider, then go to your nearest urgent care or emergency department. If the problem is serious and urgent, please call 911. ____________________________________________________________________________________________   

## 2021-06-09 NOTE — Progress Notes (Signed)
PROVIDER NOTE: Information contained herein reflects review and annotations entered in association with encounter. Interpretation of such information and data should be left to medically-trained personnel. Information provided to patient can be located elsewhere in the medical record under "Patient Instructions". Document created using STT-dictation technology, any transcriptional errors that may result from process are unintentional.    Patient: Kristin [name]  Service Category: Procedure  Provider: Gaspar Cola, MD  DOB: Feb 06, 1930  DOS: 06/09/2021  Location: [facility]  MRN: [id number]  Setting: Ambulatory - outpatient  Referring Provider: Milinda Pointer, MD  Type: Established Patient  Specialty: Interventional Pain Management  PCP: Olin Hauser, DO   Primary Reason for Visit: Interventional Pain Management Treatment. CC: Back Pain (lower)   Procedure:          Anesthesia, Analgesia, Anxiolysis:  Type: Therapeutic Inter-Laminar Epidural Steroid Injection           Region: Lumbar Level: L2-3 Level. Laterality: Left         Type: Local Anesthesia Local Anesthetic: Lidocaine 1-2% Sedation: None  Indication(s):  Analgesia Route: Infiltration (Spelter/IM) IV Access: Declined   Position: Prone with head of the table was raised to facilitate breathing.   Indications: 1. Chronic low back pain (1ry area of Pain) (Bilateral) (L>R) w/ sciatica (Bilateral)   2. DDD (degenerative disc disease), lumbosacral   3. 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 Lumbar foraminal stenosis (Severe Left L4-5)   6. Chronic lower extremity pain (2ry area of Pain) (Bilateral) (L>R)   7. Chronic lumbar radicular pain (Bilateral) (L>R)   8. Chronic anticoagulation (Eliquis)    Pain Score: Pre-procedure: 5 /10 Post-procedure: 5 /10    Pre-op H&P Assessment:  Kristin [name] is a 87 y.o. (year  old), female patient, seen today for interventional treatment. She  has a past surgical history that includes Abdominal hysterectomy; Back surgery; Rotator cuff repair; Breast surgery; Cataract extraction w/PHACO (10/19/2011); Cataract extraction w/PHACO (11/02/2011); Carpal tunnel release; Joint replacement; Colon surgery; Pacemaker insertion (N/A, 12/24/2015); Cholecystectomy; Breast lumpectomy (Right, 2004); Insert / replace / remove pacemaker; and Lower Extremity Angiography (Left, 10/14/2020). Kristin [name] has a current medication list which includes the following prescription(s): acetaminophen, albuterol, apixaban, aspirin ec, atorvastatin, bisacodyl, vitamin d, gabapentin, levocetirizine, melatonin, oxycodone, [START ON 06/26/2021] oxycodone, [START ON 07/26/2021] oxycodone, oxygen-helium, ropinirole, and anoro ellipta, and the following Facility-Administered Medications: iohexol, lidocaine, ropivacaine (pf) 2 mg/ml (0.2%), sodium chloride flush, and triamcinolone acetonide. Her primarily concern today is the Back Pain (lower)  Initial Vital Signs:  Pulse/HCG Rate: 65  Temp: (!) 96.9 F (36.1 C) Resp: 16 BP: (!) 152/98 SpO2: 96 %  BMI: Estimated body mass index is 23.49 kg/m as calculated from the following:   Height as of this encounter: 5\' 7"  (1.702 m).   Weight as of this encounter: 150 lb (68 kg).  Risk Assessment: Allergies: Reviewed. She is allergic to flexeril [cyclobenzaprine hcl] and vancomycin.  Allergy Precautions: None required Coagulopathies: Reviewed. None identified.  Blood-thinner therapy: None at this time Active Infection(s): Reviewed. None identified. Ms. Tietje is afebrile  Site Confirmation: Kristin [name] was asked to confirm the procedure and laterality before marking the site Procedure checklist: Completed Consent: Before the procedure and under the influence of no sedative(s), amnesic(s), or anxiolytics, the patient was informed of the treatment options, risks and possible  complications. To fulfill our ethical and legal obligations, as recommended by the  American Medical Association's Code of Ethics, I have informed the patient of my clinical impression; the nature and purpose of the treatment or procedure; the risks, benefits, and possible complications of the intervention; the alternatives, including doing nothing; the risk(s) and benefit(s) of the alternative treatment(s) or procedure(s); and the risk(s) and benefit(s) of doing nothing. The patient was provided information about the general risks and possible complications associated with the procedure. These may include, but are not limited to: failure to achieve desired goals, infection, bleeding, organ or nerve damage, allergic reactions, paralysis, and death. In addition, the patient was informed of those risks and complications associated to Spine-related procedures, such as failure to decrease pain; infection (i.e.: Meningitis, epidural or intraspinal abscess); bleeding (i.e.: epidural hematoma, subarachnoid hemorrhage, or any other type of intraspinal or peri-dural bleeding); organ or nerve damage (i.e.: Any type of peripheral nerve, nerve root, or spinal cord injury) with subsequent damage to sensory, motor, and/or autonomic systems, resulting in permanent pain, numbness, and/or weakness of one or several areas of the body; allergic reactions; (i.e.: anaphylactic reaction); and/or death. Furthermore, the patient was informed of those risks and complications associated with the medications. These include, but are not limited to: allergic reactions (i.e.: anaphylactic or anaphylactoid reaction(s)); adrenal axis suppression; blood sugar elevation that in diabetics may result in ketoacidosis or comma; water retention that in patients with history of congestive heart failure may result in shortness of breath, pulmonary edema, and decompensation with resultant heart failure; weight gain; swelling or edema; medication-induced  neural toxicity; particulate matter embolism and blood vessel occlusion with resultant organ, and/or nervous system infarction; and/or aseptic necrosis of one or more joints. Finally, the patient was informed that Medicine is not an exact science; therefore, there is also the possibility of unforeseen or unpredictable risks and/or possible complications that may result in a catastrophic outcome. The patient indicated having understood very clearly. We have given the patient no guarantees and we have made no promises. Enough time was given to the patient to ask questions, all of which were answered to the patient's satisfaction. Ms. Lahm has indicated that she wanted to continue with the procedure. Attestation: I, the ordering provider, attest that I have discussed with the patient the benefits, risks, side-effects, alternatives, likelihood of achieving goals, and potential problems during recovery for the procedure that I have provided informed consent. Date  Time: 06/09/2021 10:34 AM  Pre-Procedure Preparation:  Monitoring: As per clinic protocol. Respiration, ETCO2, SpO2, BP, heart rate and rhythm monitor placed and checked for adequate function Safety Precautions: Patient was assessed for positional comfort and pressure points before starting the procedure. Time-out: I initiated and conducted the "Time-out" before starting the procedure, as per protocol. The patient was asked to participate by confirming the accuracy of the "Time Out" information. Verification of the correct person, site, and procedure were performed and confirmed by me, the nursing staff, and the patient. "Time-out" conducted as per Joint Commission's Universal Protocol (UP.01.01.01). Time: 1101  Description of Procedure:          Target Area: The interlaminar space, initially targeting the lower laminar border of the superior vertebral body. Approach: Paramedial approach. Area Prepped: Entire Posterior Lumbar Region DuraPrep  (Iodine Povacrylex [0.7% available iodine] and Isopropyl Alcohol, 74% w/w) Safety Precautions: Aspiration looking for blood return was conducted prior to all injections. At no point did we inject any substances, as a needle was being advanced. No attempts were made at seeking any paresthesias. Safe injection practices and needle  disposal techniques used. Medications properly checked for expiration dates. SDV (single dose vial) medications used. Description of the Procedure: Protocol guidelines were followed. The procedure needle was introduced through the skin, ipsilateral to the reported pain, and advanced to the target area. Bone was contacted and the needle walked caudad, until the lamina was cleared. The epidural space was identified using "loss-of-resistance technique" with 2-3 ml of PF-NaCl (0.9% NSS), in a 5cc LOR glass syringe.  Vitals:   06/09/21 1032 06/09/21 1100 06/09/21 1110  BP: (!) 152/98 (!) 173/97 (!) 171/96  Pulse: 65 63 64  Resp: 16 16 17   Temp: (!) 96.9 F (36.1 C)    TempSrc: Temporal    SpO2: 96% 96% 97%  Weight: 150 lb (68 kg)    Height: 5\' 7"  (1.702 m)      Start Time: 1101 hrs. End Time: 1109 hrs.  Materials:  Needle(s) Type: Epidural needle Gauge: 17G Length: 3.5-in Medication(s): Please see orders for medications and dosing details.  Imaging Guidance (Spinal):          Type of Imaging Technique: Fluoroscopy Guidance (Spinal) Indication(s): Assistance in needle guidance and placement for procedures requiring needle placement in or near specific anatomical locations not easily accessible without such assistance. Exposure Time: Please see nurses notes. Contrast: Before injecting any contrast, we confirmed that the patient did not have an allergy to iodine, shellfish, or radiological contrast. Once satisfactory needle placement was completed at the desired level, radiological contrast was injected. Contrast injected under live fluoroscopy. No contrast  complications. See chart for type and volume of contrast used. Fluoroscopic Guidance: I was personally present during the use of fluoroscopy. "Tunnel Vision Technique" used to obtain the best possible view of the target area. Parallax error corrected before commencing the procedure. "Direction-depth-direction" technique used to introduce the needle under continuous pulsed fluoroscopy. Once target was reached, antero-posterior, oblique, and lateral fluoroscopic projection used confirm needle placement in all planes. Images permanently stored in EMR. Interpretation: I personally interpreted the imaging intraoperatively. Adequate needle placement confirmed in multiple planes. Appropriate spread of contrast into desired area was observed. No evidence of afferent or efferent intravascular uptake. No intrathecal or subarachnoid spread observed. Permanent images saved into the patient's record.  Antibiotic Prophylaxis:   Anti-infectives (From admission, onward)    None      Indication(s): None identified  Post-operative Assessment:  Post-procedure Vital Signs:  Pulse/HCG Rate: 64 (NSR)  Temp: (!) 96.9 F (36.1 C) Resp: 17 BP: (!) 171/96 SpO2: 97 %  EBL: None  Complications: No immediate post-treatment complications observed by team, or reported by patient.  Note: The patient tolerated the entire procedure well. A repeat set of vitals were taken after the procedure and the patient was kept under observation following institutional policy, for this type of procedure. Post-procedural neurological assessment was performed, showing return to baseline, prior to discharge. The patient was provided with post-procedure discharge instructions, including a section on how to identify potential problems. Should any problems arise concerning this procedure, the patient was given instructions to immediately contact us, at any time, without hesitation. In any case, we plan to contact the patient by telephone for  a follow-up status report regarding this interventional procedure.  Comments:  No additional relevant information.  Plan of Care  Orders:  Orders Placed This Encounter  Procedures   Lumbar Epidural Injection    Scheduling Instructions:     Procedure: Interlaminar LESI L2-3     Laterality: Left     Sedation:  Patient's choice     Timeframe: Today    Order Specific Question:   Where will this procedure be performed?    Answer:   [facility]   DG PAIN CLIN[facility] C-ARM 1-60 MIN NO REPORT    Intraoperative interpretation by procedural physician at [place].    Standing Status:   Standing    Number of Occurrences:   1    Order Specific Question:   Reason for exam:    Answer:   Assistance in needle guidance and placement for procedures requiring needle placement in or near specific anatomical locations not easily accessible without such assistance.   Informed Consent Details: Physician/Practitioner Attestation; Transcribe to consent form and obtain patient signature    Note: Always confirm laterality of pain with Ms. Horine, before procedure. Transcribe to consent form and obtain patient signature.    Order Specific Question:   Physician/Practitioner attestation of informed consent for procedure/surgical case    Answer:   I, the physician/practitioner, attest that I have discussed with the patient the benefits, risks, side effects, alternatives, likelihood of achieving goals and potential problems during recovery for the procedure that I have provided informed consent.    Order Specific Question:   Procedure    Answer:   Lumbar epidural steroid injection under fluoroscopic guidance    Order Specific Question:   Physician/Practitioner performing the procedure    Answer:   Lanika Colgate A. Dossie Arbour, MD    Order Specific Question:   Indication/Reason    Answer:   Low back and/or lower extremity pain secondary to lumbar radiculitis   Care order/instruction: Please confirm that the  patient has stopped the Eliquis (Apixaban) x 3 days prior to procedure or surgery.    Please confirm that the patient has stopped the Eliquis (Apixaban) x 3 days prior to procedure or surgery.    Standing Status:   Standing    Number of Occurrences:   1   Provide equipment / supplies at bedside    "Epidural Tray" (Disposable  single use) Catheter: NOT required    Standing Status:   Standing    Number of Occurrences:   1    Order Specific Question:   Specify    Answer:   Epidural Tray   Bleeding precautions    Standing Status:   Standing    Number of Occurrences:   1    Chronic Opioid Analgesic:  Oxycodone IR 10 mg, 1 tab PO BID (20 mg/day of oxycodone) MME/day: 30 mg/day.   Medications ordered for procedure: Meds ordered this encounter  Medications   iohexol (OMNIPAQUE) 180 MG/ML injection 10 mL    Must be Myelogram-compatible. If not available, you may substitute with a water-soluble, non-ionic, hypoallergenic, myelogram-compatible radiological contrast medium.   lidocaine (XYLOCAINE) 2 % (with pres) injection 400 mg   sodium chloride flush (NS) 0.9 % injection 2 mL   ropivacaine (PF) 2 mg/mL (0.2%) (NAROPIN) injection 2 mL   triamcinolone acetonide (KENALOG-40) injection 40 mg    Medications administered: Kristin [name] had no medications administered during this visit.  See the medical record for exact dosing, route, and time of administration.  Follow-up plan:   Return in about 2 weeks (around 06/23/2021) for Proc-day (T,Th), (VV), (PPE).       Interventional Therapies  Risk  Complexity Considerations:   NOTE: ELIQUIS ANTICOAGULATION (Stop: x3 days prior to procedure  Restart 6 hours after procedures)   Planned  Pending:   Pending further evaluation  Under consideration:   Diagnostic right SI joint block  Possible bilateral SI joint RFA  Diagnostic bilateral femoral nerve + obturator NB  Possible bilateral femoral nerve + obturator nerve RFA  Diagnostic right  IA shoulder joint injection     Completed:   Diagnostic/therapeutic right L2 TFESI x1 (01/31/2020)  Diagnostic/therapeutic left L4 TFESI x1 (01/31/2020) Diagnostic left SI joint block x2 (09/27/2019) Palliative left CESI x2 (10/13/2017) Palliative left trochanteric bursa injection x1 (03/27/2019)  Palliative right IA hip inj. x1  (100% on the right) (02/08/2019) Palliative left IA hip inj. x2 (02/08/2019) Palliative right lumbar facet MBB x2 (01/10/2020) Palliative left lumbar facet MBB x4 (01/10/2020) Palliative right lumbar facet RFA x1 (09/19/18) Palliative left lumbar facet RFA x3 (08/01/18) Diagnostic/therapeutic right suprascapular NB x3 (05/10/2017)  Palliative bilateral suprascapular nerve RFA x1 (07/13/18)   Therapeutic  Palliative (PRN) options:   Diagnostic left SI joint block #2  Palliative left CESI #3  Palliative left trochanteric bursa injection #2  Palliative right IA hip inj. #2  (100% on the right) Palliative left IA hip inj. #3   Palliative right lumbar facet block #3  Palliative left lumbar facet block #5  Palliative right lumbar facet RFA #2  Palliative left lumbar facet RFA #4  Palliative bilateral suprascapular nerve block   Palliative bilateral suprascapular nerve RFA #2         Recent Visits Date Type Provider Dept  05/27/21 Office Visit Milinda Pointer, MD [facility]  04/07/21 Telemedicine Milinda Pointer, MD [facility]  03/24/21 Procedure visit Milinda Pointer, MD [facility]  Showing recent visits within past 90 days and meeting all other requirements Today's Visits Date Type Provider Dept  06/09/21 Procedure visit Milinda Pointer, MD [facility]  Showing today's visits and meeting all other requirements Future Appointments Date Type Provider Dept  07/07/21 Appointment Milinda Pointer, MD [facility]  08/19/21 Appointment Milinda Pointer, MD [facility]  Showing future  appointments within next 90 days and meeting all other requirements Disposition: Discharge home  Discharge (Date  Time): 06/09/2021; 1116 hrs.   Primary Care Physician: Olin Hauser, DO Location: [facility] Note by: Gaspar Cola, MD Date: 06/09/2021; Time: 11:27 AM  Disclaimer:  Medicine is not an exact science. The only guarantee in medicine is that nothing is guaranteed. It is important to note that the decision to proceed with this intervention was based on the information collected from the patient. The Data and conclusions were drawn from the patient's questionnaire, the interview, and the physical examination. Because the information was provided in large part by the patient, it cannot be guaranteed that it has not been purposely or unconsciously manipulated. Every effort has been made to obtain as much relevant data as possible for this evaluation. It is important to note that the conclusions that lead to this procedure are derived in large part from the available data. Always take into account that the treatment will also be dependent on availability of resources and existing treatment guidelines, considered by other Pain Management Practitioners as being common knowledge and practice, at the time of the intervention. For Medico-Legal purposes, it is also important to point out that variation in procedural techniques and pharmacological choices are the acceptable norm. The indications, contraindications, technique, and results of the above procedure should only be interpreted and judged by a Board-Certified Interventional Pain Specialist with extensive familiarity and expertise in the same exact procedure and technique.

## 2021-06-10 ENCOUNTER — Telehealth: Payer: Self-pay | Admitting: *Deleted

## 2021-06-10 DIAGNOSIS — J45998 Other asthma: Secondary | ICD-10-CM | POA: Diagnosis not present

## 2021-06-10 NOTE — Telephone Encounter (Signed)
No problems post procedure. 

## 2021-06-11 NOTE — Telephone Encounter (Signed)
[facility] will be coming out to the patient home to start unna wrap

## 2021-06-12 ENCOUNTER — Encounter (INDEPENDENT_AMBULATORY_CARE_PROVIDER_SITE_OTHER): Payer: Medicare HMO

## 2021-06-12 DIAGNOSIS — N189 Chronic kidney disease, unspecified: Secondary | ICD-10-CM | POA: Diagnosis not present

## 2021-06-12 DIAGNOSIS — L97321 Non-pressure chronic ulcer of left ankle limited to breakdown of skin: Secondary | ICD-10-CM | POA: Diagnosis not present

## 2021-06-12 DIAGNOSIS — J45998 Other asthma: Secondary | ICD-10-CM | POA: Diagnosis not present

## 2021-06-12 DIAGNOSIS — I890 Lymphedema, not elsewhere classified: Secondary | ICD-10-CM | POA: Diagnosis not present

## 2021-06-12 DIAGNOSIS — M1030 Gout due to renal impairment, unspecified site: Secondary | ICD-10-CM | POA: Diagnosis not present

## 2021-06-12 DIAGNOSIS — I4891 Unspecified atrial fibrillation: Secondary | ICD-10-CM | POA: Diagnosis not present

## 2021-06-12 DIAGNOSIS — I509 Heart failure, unspecified: Secondary | ICD-10-CM | POA: Diagnosis not present

## 2021-06-12 DIAGNOSIS — I872 Venous insufficiency (chronic) (peripheral): Secondary | ICD-10-CM | POA: Diagnosis not present

## 2021-06-12 DIAGNOSIS — J449 Chronic obstructive pulmonary disease, unspecified: Secondary | ICD-10-CM | POA: Diagnosis not present

## 2021-06-12 DIAGNOSIS — I130 Hypertensive heart and chronic kidney disease with heart failure and stage 1 through stage 4 chronic kidney disease, or unspecified chronic kidney disease: Secondary | ICD-10-CM | POA: Diagnosis not present

## 2021-06-17 DIAGNOSIS — I4892 Unspecified atrial flutter: Secondary | ICD-10-CM | POA: Diagnosis not present

## 2021-06-17 DIAGNOSIS — I5032 Chronic diastolic (congestive) heart failure: Secondary | ICD-10-CM | POA: Diagnosis not present

## 2021-06-17 DIAGNOSIS — I480 Paroxysmal atrial fibrillation: Secondary | ICD-10-CM | POA: Diagnosis not present

## 2021-06-17 DIAGNOSIS — Z23 Encounter for immunization: Secondary | ICD-10-CM | POA: Diagnosis not present

## 2021-06-17 DIAGNOSIS — I10 Essential (primary) hypertension: Secondary | ICD-10-CM | POA: Diagnosis not present

## 2021-06-17 DIAGNOSIS — E785 Hyperlipidemia, unspecified: Secondary | ICD-10-CM | POA: Diagnosis not present

## 2021-06-17 DIAGNOSIS — I4891 Unspecified atrial fibrillation: Secondary | ICD-10-CM | POA: Diagnosis not present

## 2021-06-18 ENCOUNTER — Other Ambulatory Visit: Payer: Self-pay | Admitting: Family Medicine

## 2021-06-18 DIAGNOSIS — J302 Other seasonal allergic rhinitis: Secondary | ICD-10-CM

## 2021-06-19 ENCOUNTER — Encounter (INDEPENDENT_AMBULATORY_CARE_PROVIDER_SITE_OTHER): Payer: Medicare HMO

## 2021-06-19 DIAGNOSIS — I872 Venous insufficiency (chronic) (peripheral): Secondary | ICD-10-CM | POA: Diagnosis not present

## 2021-06-19 DIAGNOSIS — L97321 Non-pressure chronic ulcer of left ankle limited to breakdown of skin: Secondary | ICD-10-CM | POA: Diagnosis not present

## 2021-06-19 DIAGNOSIS — I890 Lymphedema, not elsewhere classified: Secondary | ICD-10-CM | POA: Diagnosis not present

## 2021-06-19 DIAGNOSIS — I130 Hypertensive heart and chronic kidney disease with heart failure and stage 1 through stage 4 chronic kidney disease, or unspecified chronic kidney disease: Secondary | ICD-10-CM | POA: Diagnosis not present

## 2021-06-19 DIAGNOSIS — I4891 Unspecified atrial fibrillation: Secondary | ICD-10-CM | POA: Diagnosis not present

## 2021-06-19 DIAGNOSIS — N189 Chronic kidney disease, unspecified: Secondary | ICD-10-CM | POA: Diagnosis not present

## 2021-06-19 DIAGNOSIS — M1030 Gout due to renal impairment, unspecified site: Secondary | ICD-10-CM | POA: Diagnosis not present

## 2021-06-19 DIAGNOSIS — J449 Chronic obstructive pulmonary disease, unspecified: Secondary | ICD-10-CM | POA: Diagnosis not present

## 2021-06-19 DIAGNOSIS — I509 Heart failure, unspecified: Secondary | ICD-10-CM | POA: Diagnosis not present

## 2021-06-19 NOTE — Telephone Encounter (Signed)
Requested Prescriptions  Pending Prescriptions Disp Refills  . levocetirizine (XYZAL) 5 MG tablet [Pharmacy Med Name: LEVOCETIRIZINE 5 MG TABLET] 8 tablet 9    Sig: TAKE (1/2) TABLET BY MOUTH EVERY OTHER DAY.     Ear, Nose, and Throat:  Antihistamines Passed - 06/18/2021 12:30 PM      Passed - Valid encounter within last 12 months    Recent Outpatient Visits          1 month ago Chronic diastolic CHF (congestive heart failure) ([place])   [place], DO   2 months ago Annual physical exam   [place], DO   7 months ago Chronic respiratory failure with hypoxia [facility])   [place], DO   8 months ago Benign hypertension with CKD (chronic kidney disease) stage III [facility])   [place], DO   10 months ago Community acquired pneumonia of right lower lobe of lung   [place], DO      Future Appointments            In 2 weeks [place], Devonne Doughty, DO [facility], [facility]

## 2021-06-24 ENCOUNTER — Ambulatory Visit: Payer: Medicare HMO | Admitting: Family Medicine

## 2021-06-26 ENCOUNTER — Encounter (INDEPENDENT_AMBULATORY_CARE_PROVIDER_SITE_OTHER): Payer: Medicare HMO

## 2021-06-26 DIAGNOSIS — I872 Venous insufficiency (chronic) (peripheral): Secondary | ICD-10-CM | POA: Diagnosis not present

## 2021-06-26 DIAGNOSIS — I890 Lymphedema, not elsewhere classified: Secondary | ICD-10-CM | POA: Diagnosis not present

## 2021-06-26 DIAGNOSIS — L97321 Non-pressure chronic ulcer of left ankle limited to breakdown of skin: Secondary | ICD-10-CM | POA: Diagnosis not present

## 2021-06-26 DIAGNOSIS — I4891 Unspecified atrial fibrillation: Secondary | ICD-10-CM | POA: Diagnosis not present

## 2021-06-26 DIAGNOSIS — N189 Chronic kidney disease, unspecified: Secondary | ICD-10-CM | POA: Diagnosis not present

## 2021-06-26 DIAGNOSIS — I509 Heart failure, unspecified: Secondary | ICD-10-CM | POA: Diagnosis not present

## 2021-06-26 DIAGNOSIS — I130 Hypertensive heart and chronic kidney disease with heart failure and stage 1 through stage 4 chronic kidney disease, or unspecified chronic kidney disease: Secondary | ICD-10-CM | POA: Diagnosis not present

## 2021-06-26 DIAGNOSIS — M1030 Gout due to renal impairment, unspecified site: Secondary | ICD-10-CM | POA: Diagnosis not present

## 2021-06-26 DIAGNOSIS — J449 Chronic obstructive pulmonary disease, unspecified: Secondary | ICD-10-CM | POA: Diagnosis not present

## 2021-06-29 ENCOUNTER — Other Ambulatory Visit: Payer: Self-pay | Admitting: Family Medicine

## 2021-06-29 DIAGNOSIS — M5137 Other intervertebral disc degeneration, lumbosacral region: Secondary | ICD-10-CM

## 2021-06-29 DIAGNOSIS — G894 Chronic pain syndrome: Secondary | ICD-10-CM

## 2021-06-29 NOTE — Telephone Encounter (Signed)
Requested Prescriptions  Pending Prescriptions Disp Refills  . gabapentin (NEURONTIN) 100 MG capsule [Pharmacy Med Name: GABAPENTIN 100 MG CAPSULE] 270 capsule 0    Sig: TAKE 3 CAPSULES BY MOUTH AT BEDTIME     Neurology: Anticonvulsants - gabapentin Passed - 06/29/2021  9:11 AM      Passed - Valid encounter within last 12 months    Recent Outpatient Visits          2 months ago Chronic diastolic CHF (congestive heart failure) [facility])   [place], DO   2 months ago Annual physical exam   [place], DO   7 months ago Chronic respiratory failure with hypoxia [facility])   [facility], Devonne Doughty, DO   9 months ago Benign hypertension with CKD (chronic kidney disease) stage III [facility])   [place], DO   10 months ago Community acquired pneumonia of right lower lobe of lung   [place], DO      Future Appointments            In 1 week Parks Ranger, Devonne Doughty, DO [facility], [facility]

## 2021-07-03 ENCOUNTER — Ambulatory Visit (INDEPENDENT_AMBULATORY_CARE_PROVIDER_SITE_OTHER): Payer: Medicare HMO | Admitting: Nurse Practitioner

## 2021-07-03 DIAGNOSIS — L97321 Non-pressure chronic ulcer of left ankle limited to breakdown of skin: Secondary | ICD-10-CM | POA: Diagnosis not present

## 2021-07-03 DIAGNOSIS — I872 Venous insufficiency (chronic) (peripheral): Secondary | ICD-10-CM | POA: Diagnosis not present

## 2021-07-03 DIAGNOSIS — I890 Lymphedema, not elsewhere classified: Secondary | ICD-10-CM | POA: Diagnosis not present

## 2021-07-03 DIAGNOSIS — J449 Chronic obstructive pulmonary disease, unspecified: Secondary | ICD-10-CM | POA: Diagnosis not present

## 2021-07-03 DIAGNOSIS — N189 Chronic kidney disease, unspecified: Secondary | ICD-10-CM | POA: Diagnosis not present

## 2021-07-03 DIAGNOSIS — I4891 Unspecified atrial fibrillation: Secondary | ICD-10-CM | POA: Diagnosis not present

## 2021-07-03 DIAGNOSIS — I130 Hypertensive heart and chronic kidney disease with heart failure and stage 1 through stage 4 chronic kidney disease, or unspecified chronic kidney disease: Secondary | ICD-10-CM | POA: Diagnosis not present

## 2021-07-03 DIAGNOSIS — M1030 Gout due to renal impairment, unspecified site: Secondary | ICD-10-CM | POA: Diagnosis not present

## 2021-07-03 DIAGNOSIS — I509 Heart failure, unspecified: Secondary | ICD-10-CM | POA: Diagnosis not present

## 2021-07-06 NOTE — Progress Notes (Addendum)
Patient: Kristin [name]  Service Category: E/M  Provider: Gaspar Cola, MD  DOB: [birth year]  DOS: 07/07/2021  Location: Office  MRN: [id number]  Setting: Ambulatory outpatient  Referring Provider: [name] *  Type: Established Patient  Specialty: Interventional Pain Management  PCP: Olin Hauser, DO  Location: Remote location  Delivery: TeleHealth     Virtual Encounter - Pain Management PROVIDER NOTE: Information contained herein reflects review and annotations entered in association with encounter. Interpretation of such information and data should be left to medically-trained personnel. Information provided to patient can be located elsewhere in the medical record under "Patient Instructions". Document created using STT-dictation technology, any transcriptional errors that may result from process are unintentional.    Contact & Pharmacy Preferred: [phone] Home: [phone] (home) Mobile: [phone] (mobile) E-mail: [email or web address]  [place], [place] - [address] [place] [address] Phone: [phone] Fax: [phone]  [id number] [place] [id number]: [phone]: [phone]  Pre-screening  Ms. Trostle offered "in-person" vs "virtual" encounter. She indicated preferring virtual for this encounter.   Reason COVID-19*  Social distancing based on CDC and AMA recommendations.   I contacted Kristin [name] 07/07/2021 via telephone.      I clearly identified myself as FGaspar Cola MD. I verified that I was speaking with the correct person using two identifiers (Name: Kristin [name] and date of birth: 3October 11, 1931.  Consent I sought verbal advanced consent from Kristin Backersfor virtual visit interactions. I informed Kristin [name] of possible security and privacy concerns, risks, and limitations associated with providing  "not-in-person" medical evaluation and management services. I also informed Kristin [name] of the availability of "in-person" appointments. Finally, I informed her that there would be a charge for the virtual visit and that she could be  personally, fully or partially, financially responsible for it. [name] understanding and agreed to proceed.   Historic Elements   Ms. RTARISA PAOLAis a 87y.o. year old, female patient evaluated today after our last contact on 06/09/2021. Kristin [name] has a past medical history of Acute postoperative pain (02/09/2018), Arrhythmia, sinus node (04/03/2014), Arthritis, Arthritis, Atrial fibrillation ([place], Back pain, Bradycardia, Breast [place] ([place] ([place]), Cancer ([place] ([place]), CHF (congestive heart failure) ([place], Chronic back pain, CKD (chronic kidney disease), COPD (chronic obstructive pulmonary disease) ([place], Cystocele, Decubitus ulcers, Dehydration, Gout, Hematuria, Hepatitis C, Hyperlipidemia, Hypertension, Hypomagnesemia (06/25/2015), OAB (overactive bladder), Overactive bladder, Restless leg, Shoulder pain, bilateral, Urinary frequency, UTI (lower urinary tract infection), and Vaginal atrophy. She also  has a past surgical history that includes Abdominal hysterectomy; Back surgery; Rotator cuff repair; Breast surgery; Cataract extraction w/PHACO (10/19/2011); Cataract extraction w/PHACO (11/02/2011); Carpal tunnel release; Joint replacement; Colon surgery; Pacemaker insertion (N/A, 12/24/2015); Cholecystectomy; Breast lumpectomy (Right, 2004); Insert / replace / remove pacemaker; and Lower Extremity Angiography (Left, 10/14/2020). Ms. GLemlerhas a current medication list which includes the following prescription(s): acetaminophen, albuterol, apixaban, aspirin ec, atorvastatin, bisacodyl, vitamin d, gabapentin, levocetirizine, melatonin, oxycodone, [START ON 07/26/2021] oxycodone, oxygen-helium, ropinirole, breztri aerosphere, oxycodone, and prednisone. She  reports that  she quit smoking about 22 years ago. Her smoking use included cigarettes. She has a 50.00 pack-year smoking history. She has quit using smokeless tobacco. She reports current drug use. Drug: Oxycodone. She reports that she does not drink alcohol. [name] allergic to flexeril [cyclobenzaprine hcl] and vancomycin.  HPI  Today, she is being contacted for a post-procedure assessment.  Today I was able to contact the patient's granddaughter who is her caregiver.  She indicates that she still having mostly back pain but the leg pain did not seem to be her primary complaint.  However, she has been going to vein and vascular surgery practice for evaluation and treatment of her poor circulation.  The granddaughter confirmed that she is not interested in any surgical alternatives since she is 87 years old.  I reminded her that we are dealing with a chronic condition that will not go away and all we're trying to do is keep it under control.  She understood and accepted.  Post-Procedure Evaluation  Procedure (06/09/2021):  Type: Therapeutic Inter-Laminar Epidural Steroid Injection           Region: Lumbar Level: L2-3 Level. Laterality: Left          Type: Local Anesthesia Local Anesthetic: Lidocaine 1-2% Sedation: None  Indication(s):  Analgesia Route: Infiltration (/IM) IV Access: Declined     Position: Prone with head of the table was raised to facilitate breathing.    Indications: 1. Chronic low back pain (1ry area of Pain) (Bilateral) (L>R) w/ sciatica (Bilateral)   2. DDD (degenerative disc disease), lumbosacral   3. 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 Lumbar foraminal stenosis (Severe Left L4-5)   6. Chronic lower extremity pain (2ry area of Pain) (Bilateral) (L>R)   7. Chronic lumbar radicular pain (Bilateral) (L>R)   8. Chronic anticoagulation (Eliquis)     Pain Score: Pre-procedure: 5  /10 Post-procedure: 5 /10   Anxiolysis: Please see nurses note.  Effectiveness during initial hour after procedure (Ultra-Short Term Relief): 20 %.  Local anesthetic used: Long-acting (4-6 hours) Effectiveness: Defined as any analgesic benefit obtained secondary to the administration of local anesthetics. This carries significant diagnostic value as to the etiological location, or anatomical origin, of the pain. Duration of benefit is expected to coincide with the duration of the local anesthetic used.  Effectiveness during initial 4-6 hours after procedure (Short-Term Relief): 20 %.  Long-term benefit: Defined as any relief past the pharmacologic duration of the local anesthetics.  Effectiveness past the initial 6 hours after procedure (Long-Term Relief): 20 % (patient denies very much pain relief at all.).  Benefits, current: Defined as benefit present at the time of this evaluation.   Analgesia: Currently her worst pain is that of the lower back. Function: Back to baseline ROM: Back to baseline  Pharmacotherapy Assessment   Analgesic: Oxycodone IR 10 mg, 1 tab PO BID (20 mg/day of oxycodone) MME/day: 30 mg/day.   Monitoring: [place] PMP: PDMP reviewed during this encounter.       Pharmacotherapy: No side-effects or adverse reactions reported. Compliance: No problems identified. Effectiveness: Clinically acceptable. Plan: Refer to "POC". UDS:  Summary  Date Value Ref Range Status  03/04/2021 Note  Final    Comment:    ==================================================================== [employer] Select 13 (MW) ==================================================================== Test                             Result       Flag       Units  Drug Present and Declared for Prescription Verification   Oxymorphone                    270  EXPECTED   ng/mg creat   Noroxycodone                   317          EXPECTED   ng/mg creat    Oxymorphone and noroxycodone are expected  metabolites of oxycodone.    Sources of oxycodone are scheduled prescription medications.    Oxymorphone is also available as a scheduled prescription medication.  Drug Absent but Declared for Prescription Verification   Oxycodone                      Not Detected UNEXPECTED ng/mg creat    Oxycodone is almost always present in patients taking this drug    consistently.  Absence of oxycodone could be due to lapse of time    since the last dose or unusual pharmacokinetics (rapid metabolism).  ==================================================================== Test                      Result    Flag   Units      Ref Range   Creatinine              30               mg/dL      >=20 ==================================================================== Declared Medications:  The flagging and interpretation on this report are based on the  following declared medications.  Unexpected results may arise from  inaccuracies in the declared medications.   **Note: The testing scope of this panel includes these medications:   Oxycodone   **Note: The testing scope of this panel does not include the  following reported medications:   Acetaminophen  Albuterol  Apixaban (Eliquis)  Aspirin  Atorvastatin  Bisacodyl  Dextromethorphan (Mucinex DM)  Furosemide  Guaifenesin (Mucinex DM)  Helium  Levocetirizine (Xyzal)  Lisinopril  Melatonin  Oxygen  Ropinirole  Solifenacin (Vesicare)  Supplement (Fiber)  Tiotropium  Topical Lidocaine  Umeclidinium (Anoro)  Vilanterol (Anoro) ==================================================================== For clinical consultation, please call [phone]. ====================================================================      Laboratory Chemistry Profile   Renal Lab Results  Component Value Date   BUN 60 (H) 04/08/2021   CREATININE 1.70 (H) 04/08/2021   BCR 40 (H) 04/02/2021   GFRAA 52 (L) 10/02/2020   GFRNONAA 28 (L) 04/08/2021     Hepatic Lab Results  Component Value Date   AST 21 11/07/2020   ALT 16 11/07/2020   ALBUMIN 3.7 11/07/2020   ALKPHOS 55 11/07/2020    Electrolytes Lab Results  Component Value Date   NA 138 04/08/2021   K 5.0 04/08/2021   CL 112 (H) 04/08/2021   CALCIUM 9.3 04/08/2021   MG 2.0 11/07/2020   PHOS 3.0 08/05/2020    Bone Lab Results  Component Value Date   VD25OH 31 04/01/2020    Inflammation (CRP: Acute Phase) (ESR: Chronic Phase) Lab Results  Component Value Date   CRP <0.5 09/24/2015   ESRSEDRATE 12 09/24/2015   LATICACIDVEN 1.8 08/02/2020         Note: Above Lab results reviewed.  Imaging  ABI WITH/WO TBI  LOWER EXTREMITY DOPPLER STUDY  Patient Name:  ERETRIA MANTERNACH  Date of Exam:   06/04/2021 Medical Rec #: [id number]      Accession #:    [id number] Date of Birth: 1930-01-11       Patient Gender: F Patient Age:   68 years Exam Location:  [facility] Procedure:  VAS [name] ABI WITH/WO TBI Referring Phys: [facility]  --------------------------------------------------------------------------------   Indications: Peripheral artery disease.   Vascular Interventions: 10/14/2020: PTA of the Left SFA and Popliteal Artery.                         PTA of the Left Posterior tibial Artery and                         Tibioperoneal trunk. PTA and Stent placement Left EIA.  Comparison Study: 03/06/2021  Performing Technologist: Charlane Ferretti RT (R)(VS)    Examination Guidelines: A complete evaluation includes at minimum, Doppler waveform signals and systolic blood pressure reading at the level of bilateral brachial, anterior tibial, and posterior tibial arteries, when vessel segments are accessible. Bilateral testing is considered an integral part of a complete examination. Photoelectric Plethysmograph (PPG) waveforms and toe systolic pressure readings are included as required and additional duplex testing as needed. Limited examinations for  reoccurring indications may be performed as noted.    ABI Findings: +---------+------------------+-----+----------+-------------+ Right    Rt Pressure (mmHg)IndexWaveform  Comment       +---------+------------------+-----+----------+-------------+ Brachial                                  Breast cancer +---------+------------------+-----+----------+-------------+ ATA      111                    monophasic              +---------+------------------+-----+----------+-------------+ PTA      117                    monophasic              +---------+------------------+-----+----------+-------------+ Delbert Harness                     Abnormal                +---------+------------------+-----+----------+-------------+  +---------+------------------+-----+----------+-------+ Left     Lt Pressure (mmHg)IndexWaveform  Comment +---------+------------------+-----+----------+-------+ Brachial                                  133     +---------+------------------+-----+----------+-------+ ATA      151                    monophasic        +---------+------------------+-----+----------+-------+ PTA      170                    monophasic        +---------+------------------+-----+----------+-------+ Margaretmary Lombard                     Abnormal          +---------+------------------+-----+----------+-------+  +-------+-----------+-----------+------------+------------+ ABI/TBIToday's ABIToday's TBIPrevious ABIPrevious TBI +-------+-----------+-----------+------------+------------+ Right  .88        .58        .91         .41          +-------+-----------+-----------+------------+------------+ Left   1.28       .47                    .70          +-------+-----------+-----------+------------+------------+  Right ABIs appear essentially unchanged compared to prior study on 03/06/2021. Right TBIs appear essentially unchanged  compared to prior study on 03/06/2021.   Summary: Right: Resting right ankle-brachial index indicates mild right lower extremity arterial disease. The right toe-brachial index is abnormal.  Left: Resting left ankle-brachial index is within normal range. No evidence of significant left lower extremity arterial disease. The left toe-brachial index is abnormal.  Left TBI appears decreased as compared to the previous exam on 03/06/2021.   *See table(s) above for measurements and observations.    Electronically signed by Hortencia Pilar MD on 06/18/2021 at 4:42:53 PM.       Final   VAS [name] LOWER EXTREMITY ARTERIAL DUPLEX LOWER EXTREMITY ARTERIAL DUPLEX STUDY  Patient Name:  COLETTA LOCKNER  Date of Exam:   06/04/2021 Medical Rec #: [id number]      Accession #:    [id number] Date of Birth: 25-Dec-1929       Patient Gender: F Patient Age:   62 years Exam Location:  [facility] Procedure:      VAS [name] LOWER EXTREMITY ARTERIAL DUPLEX Referring Phys: Hortencia Pilar  --------------------------------------------------------------------------------   Indications: Peripheral artery disease.   Vascular Interventions: 10/14/2020: PTA of the Left SFA and Popliteal Artery.                         PTA of the Left Posterior tibial Artery and                         Tibioperoneal trunk. PTA and Stent placement Left EIA. Current ABI:            Rt= .88 Lt = 1.28  Comparison Study: 03/06/2021  Performing Technologist: Charlane Ferretti RT (R)(VS)    Examination Guidelines: A complete evaluation includes B-mode imaging, spectral Doppler, color Doppler, and power Doppler as needed of all accessible portions of each vessel. Bilateral testing is considered an integral part of a complete examination. Limited examinations for reoccurring indications may be performed as noted.     +----------+--------+-----+--------+----------+----------+ RIGHT     PSV cm/sRatioStenosisWaveform  Comments    +----------+--------+-----+--------+----------+----------+ CFA Distal119                  biphasic             +----------+--------+-----+--------+----------+----------+ DFA       128                  biphasic             +----------+--------+-----+--------+----------+----------+ SFA Prox  46                   biphasic             +----------+--------+-----+--------+----------+----------+ SFA Mid   109                  biphasic             +----------+--------+-----+--------+----------+----------+ SFA Distal49                   monophasiccollateral +----------+--------+-----+--------+----------+----------+ POP Prox  50                   monophasic           +----------+--------+-----+--------+----------+----------+ POP Distal37                   monophasic           +----------+--------+-----+--------+----------+----------+  ATA Distal55                   monophasic           +----------+--------+-----+--------+----------+----------+ PTA Distal18                   monophasic           +----------+--------+-----+--------+----------+----------+    Summary: Right: Right LEA is patent with colateral flow at the distal SFA.    See table(s) above for measurements and observations.  Electronically signed by Hortencia Pilar MD on 06/18/2021 at 4:42:36 PM.    Final    Assessment  The primary encounter diagnosis was Chronic low back pain (1ry area of Pain) (Bilateral) (L>R) w/ sciatica (Bilateral). 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  Plan of Care  Problem-specific:  No problem-specific  Assessment & Plan notes found for this encounter.  Ms. KYLAN VEACH has a current medication list which includes the following long-term medication(s): albuterol, apixaban, atorvastatin, bisacodyl, gabapentin, levocetirizine, oxycodone, [START ON 07/26/2021] oxycodone, ropinirole, and oxycodone.  Pharmacotherapy (Medications Ordered): No orders of the defined types were placed in this encounter.  Orders:  No orders of the defined types were placed in this encounter.  Follow-up plan:   Return for scheduled encounter.     Interventional Therapies  Risk  Complexity Considerations:   NOTE: ELIQUIS ANTICOAGULATION (Stop: x3 days prior to procedure  Restart 6 hours after procedures)   Planned  Pending:   Pending further evaluation   Under consideration:   Diagnostic right SI joint block  Possible bilateral SI joint RFA  Diagnostic bilateral femoral nerve + obturator NB  Possible bilateral femoral nerve + obturator nerve RFA  Diagnostic right IA shoulder joint injection     Completed:   Diagnostic/therapeutic right L2 TFESI x1 (01/31/2020)  Diagnostic/therapeutic left L4 TFESI x1 (01/31/2020) Diagnostic left SI joint block x2 (09/27/2019) Palliative left CESI x2 (10/13/2017) Palliative left trochanteric bursa injection x1 (03/27/2019)  Palliative right IA hip inj. x1  (100% on the right) (02/08/2019) Palliative left IA hip inj. x2 (02/08/2019) Palliative right lumbar facet MBB x2 (01/10/2020) Palliative left lumbar facet MBB x4 (01/10/2020) Palliative right lumbar facet RFA x1 (09/19/18) Palliative left lumbar facet RFA x3 (08/01/18) Diagnostic/therapeutic right suprascapular NB x3 (05/10/2017)  Palliative bilateral suprascapular nerve RFA x1 (07/13/18)   Therapeutic  Palliative (PRN) options:   Diagnostic left SI joint block #2  Palliative left CESI #3  Palliative left trochanteric bursa injection #2  Palliative right IA hip inj. #2  (100% on the right) Palliative left IA hip inj. #3    Palliative right lumbar facet block #3  Palliative left lumbar facet block #5  Palliative right lumbar facet RFA #2  Palliative left lumbar facet RFA #4  Palliative bilateral suprascapular nerve block   Palliative bilateral suprascapular nerve RFA #2          Recent Visits Date Type Provider Dept  06/09/21 Procedure visit Milinda Pointer, MD [facility]  05/27/21 Office Visit Milinda Pointer, MD [facility]  Showing recent visits within past 90 days and meeting all other requirements Today's Visits Date Type Provider Dept  07/07/21 Office Visit Milinda Pointer, MD [facility]  Showing today's visits and meeting all other requirements Future Appointments Date Type Provider Dept  08/19/21 Appointment Milinda Pointer, MD [facility]  Showing future appointments within next 90 days and meeting all other requirements I discussed the assessment and treatment plan with the patient. The patient was provided an opportunity to ask questions and all were answered. The patient agreed with the plan and demonstrated an understanding of the instructions.  Patient advised to call back or seek an in-person evaluation if the symptoms or condition worsens.  Duration of encounter: 13 minutes.  Note by: Gaspar Cola, MD Date: 07/07/2021; Time: 3:58 PM

## 2021-07-07 ENCOUNTER — Encounter: Payer: Self-pay | Admitting: Family Medicine

## 2021-07-07 ENCOUNTER — Other Ambulatory Visit: Payer: Self-pay

## 2021-07-07 ENCOUNTER — Ambulatory Visit (INDEPENDENT_AMBULATORY_CARE_PROVIDER_SITE_OTHER): Payer: Medicare HMO | Admitting: Family Medicine

## 2021-07-07 ENCOUNTER — Ambulatory Visit: Payer: Medicare HMO | Attending: Pain Medicine | Admitting: Pain Medicine

## 2021-07-07 VITALS — BP 144/66 | HR 83 | Ht 67.0 in | Wt 159.8 lb

## 2021-07-07 DIAGNOSIS — M5441 Lumbago with sciatica, right side: Secondary | ICD-10-CM

## 2021-07-07 DIAGNOSIS — Z23 Encounter for immunization: Secondary | ICD-10-CM

## 2021-07-07 DIAGNOSIS — M48061 Spinal stenosis, lumbar region without neurogenic claudication: Secondary | ICD-10-CM | POA: Diagnosis not present

## 2021-07-07 DIAGNOSIS — M5442 Lumbago with sciatica, left side: Secondary | ICD-10-CM | POA: Diagnosis not present

## 2021-07-07 DIAGNOSIS — M79605 Pain in left leg: Secondary | ICD-10-CM

## 2021-07-07 DIAGNOSIS — I872 Venous insufficiency (chronic) (peripheral): Secondary | ICD-10-CM | POA: Diagnosis not present

## 2021-07-07 DIAGNOSIS — M25511 Pain in right shoulder: Secondary | ICD-10-CM | POA: Diagnosis not present

## 2021-07-07 DIAGNOSIS — N183 Chronic kidney disease, stage 3 unspecified: Secondary | ICD-10-CM

## 2021-07-07 DIAGNOSIS — M961 Postlaminectomy syndrome, not elsewhere classified: Secondary | ICD-10-CM | POA: Diagnosis not present

## 2021-07-07 DIAGNOSIS — M5416 Radiculopathy, lumbar region: Secondary | ICD-10-CM

## 2021-07-07 DIAGNOSIS — I129 Hypertensive chronic kidney disease with stage 1 through stage 4 chronic kidney disease, or unspecified chronic kidney disease: Secondary | ICD-10-CM

## 2021-07-07 DIAGNOSIS — J432 Centrilobular emphysema: Secondary | ICD-10-CM | POA: Diagnosis not present

## 2021-07-07 DIAGNOSIS — M5137 Other intervertebral disc degeneration, lumbosacral region: Secondary | ICD-10-CM

## 2021-07-07 DIAGNOSIS — M79604 Pain in right leg: Secondary | ICD-10-CM | POA: Diagnosis not present

## 2021-07-07 DIAGNOSIS — G8929 Other chronic pain: Secondary | ICD-10-CM | POA: Diagnosis not present

## 2021-07-07 MED ORDER — BREZTRI AEROSPHERE 160-9-4.8 MCG/ACT IN AERO: 2.0000 | INHALATION_SPRAY | Freq: Two times a day (BID) | RESPIRATORY_TRACT | 5 refills | Status: DC

## 2021-07-07 MED ORDER — PREDNISONE 10 MG PO TABS: ORAL_TABLET | ORAL | 0 refills | Status: DC

## 2021-07-07 NOTE — Patient Instructions (Addendum)
Thank you for coming to the office today.  Flu Shot today  Stop Stiolto inhaler Start new sample Breztri 2 puffs twice a day - sample is good for 1 week. Then I have printed a new order you can go pick up if you like this new inhaler.  Start Prednisone taper 6 days for cough and shoulder.  Stay OFF Lisinopril 10mg  for blood pressure, since BP is doing well. No new changes. May take Furosemide lasix as needed for swelling.  Follow back up with Dr Raul Del pulmonology at [facility] in June 2023.  Please schedule a Follow-up Appointment to: Return in about 4 months (around 11/04/2021) for 4 month follow-up COPD inhaler update, HTN/edema.  If you have any other questions or concerns, please feel free to call the office or send a message through [facility]. You may also schedule an earlier appointment if necessary.  Additionally, you may be receiving a survey about your experience at our office within a few days to 1 week by e-mail or mail. We value your feedback.  [name], DO [place]

## 2021-07-07 NOTE — Progress Notes (Signed)
Subjective:    [name], female    DOB: [birth year], 87 y.o.   MRN: [id number]  Kristin [name] is a 87 y.o. female presenting on 07/07/2021 for COPD   HPI  Centrilobular Emphysema / Chronic Cough Chronic Respiratory Failure, on supplemental O2 Followed by Dr Raul Del ([place]) On continuous O2 2-3 L and uses nightly On Stiolto inhaler, off Anoro  CHRONIC HTN CKD III Edema / Lower Ext Venous Insufficiency Reports some low BP lately 110-120s, she has stopped Lisinopril most of the time. Current Meds - OFF Lisinopril 10mg    - Has Furosemide 20mg  daily PRN half or whole dose as needed. Followed by [facility] Not on BP med anymore Denies CP, dyspnea, HA, edema, dizziness / lightheadedness   Health Maintenance: Due for Flu Shot, will receive today    Depression screen [facility] 2/9 06/09/2021 03/04/2021 01/01/2021  Decreased Interest 0 0 0  Down, Depressed, Hopeless 0 0 0  PHQ - 2 Score 0 0 0  Altered sleeping - - -  Tired, decreased energy - - -  Change in appetite - - -  Feeling bad or failure about yourself  - - -  Trouble concentrating - - -  Moving slowly or fidgety/restless - - -  Suicidal thoughts - - -  PHQ-9 Score - - -  Difficult doing work/chores - - -  Some recent data might be hidden    Social History   Tobacco Use   Smoking status: Former    Packs/day: 1.25    Years: 40.00    Pack years: 50.00    Types: Cigarettes    Quit date: 12/16/1998    Years since quitting: 22.5   Smokeless tobacco: Former  Scientific laboratory technician Use: Never used  Substance Use Topics   Alcohol use: No    Alcohol/week: 0.0 standard drinks   Drug use: Yes    Types: Oxycodone    Review of Systems Per HPI unless specifically indicated above     Objective:    BP (!) 144/66   Pulse 83   Ht 5\' 7"  (1.702 m)   Wt 159 lb 12.8 oz (72.5 kg)   SpO2 94%   BMI 25.03 kg/m   Wt Readings from Last 3 Encounters:  07/07/21 159 lb 12.8 oz (72.5 kg)  06/09/21 150 lb (68  kg)  06/04/21 151 lb 6.4 oz (68.7 kg)    Physical Exam Vitals and nursing note reviewed.  Constitutional:      General: She is not in acute distress.    Appearance: She is well-developed. She is not diaphoretic.     Comments: Currently well appearing elderly 87 yr female comfortable, cooperative  HENT:     Head: Normocephalic and atraumatic.  Eyes:     General:        Right eye: No discharge.        Left eye: No discharge.     Conjunctiva/sclera: Conjunctivae normal.  Neck:     Thyroid: No thyromegaly.  Cardiovascular:     Rate and Rhythm: Normal rate and regular rhythm.     Heart sounds: Normal heart sounds. No murmur heard. Pulmonary:     Effort: Pulmonary effort is normal. No respiratory distress.     Breath sounds: Normal breath sounds. No wheezing or rales.     Comments: Cough, some coarse breath sounds Musculoskeletal:        General: Normal range of motion.  Cervical back: Normal range of motion and neck supple.     Right lower leg: Edema (+1 stable baseline edema) present.     Left lower leg: Edema (+1 stable baseline edema) present.  Lymphadenopathy:     Cervical: No cervical adenopathy.  Skin:    General: Skin is warm and dry.     Findings: No erythema or rash.  Neurological:     Mental Status: She is alert and oriented to person, place, and time.  Psychiatric:        Behavior: Behavior normal.     Comments: Well groomed, good eye contact, normal speech and thoughts   Results for orders placed or performed during the hospital encounter of 04/07/21  Resp Panel by RT-PCR (Flu A&B, Covid) Nasopharyngeal Swab   Specimen: Nasopharyngeal Swab; Nasopharyngeal(NP) swabs in vial transport medium  Result Value Ref Range   SARS Coronavirus 2 by RT PCR NEGATIVE NEGATIVE   Influenza A by PCR NEGATIVE NEGATIVE   Influenza B by PCR NEGATIVE NEGATIVE  Basic metabolic panel  Result Value Ref Range   Sodium 134 (L) 135 - 145 mmol/L   Potassium 5.3 (H) 3.5 - 5.1 mmol/L    Chloride 106 98 - 111 mmol/L   CO2 18 (L) 22 - 32 mmol/L   Glucose, Bld 90 70 - 99 mg/dL   BUN 71 (H) 8 - 23 mg/dL   Creatinine, Ser 2.33 (H) 0.44 - 1.00 mg/dL   Calcium 9.7 8.9 - 10.3 mg/dL   GFR, Estimated 20 (L) >60 mL/min   Anion gap 10 5 - 15  CBC  Result Value Ref Range   WBC 9.6 4.0 - 10.5 K/uL   RBC 4.03 3.87 - 5.11 MIL/uL   Hemoglobin 12.6 12.0 - 15.0 g/dL   HCT 39.3 36.0 - 46.0 %   MCV 97.5 80.0 - 100.0 fL   MCH 31.3 26.0 - 34.0 pg   MCHC 32.1 30.0 - 36.0 g/dL   RDW 14.1 11.5 - 15.5 %   Platelets 152 150 - 400 K/uL   nRBC 0.0 0.0 - 0.2 %  Basic metabolic panel  Result Value Ref Range   Sodium 138 135 - 145 mmol/L   Potassium 5.0 3.5 - 5.1 mmol/L   Chloride 112 (H) 98 - 111 mmol/L   CO2 19 (L) 22 - 32 mmol/L   Glucose, Bld 73 70 - 99 mg/dL   BUN 60 (H) 8 - 23 mg/dL   Creatinine, Ser 1.70 (H) 0.44 - 1.00 mg/dL   Calcium 9.3 8.9 - 10.3 mg/dL   GFR, Estimated 28 (L) >60 mL/min   Anion gap 7 5 - 15  CBC  Result Value Ref Range   WBC 9.5 4.0 - 10.5 K/uL   RBC 4.15 3.87 - 5.11 MIL/uL   Hemoglobin 12.8 12.0 - 15.0 g/dL   HCT 40.2 36.0 - 46.0 %   MCV 96.9 80.0 - 100.0 fL   MCH 30.8 26.0 - 34.0 pg   MCHC 31.8 30.0 - 36.0 g/dL   RDW 14.1 11.5 - 15.5 %   Platelets 138 (L) 150 - 400 K/uL   nRBC 0.0 0.0 - 0.2 %  APTT  Result Value Ref Range   aPTT 32 24 - 36 seconds  Protime-INR  Result Value Ref Range   Prothrombin Time 15.4 (H) 11.4 - 15.2 seconds   INR 1.2 0.8 - 1.2  Heparin level (unfractionated)  Result Value Ref Range   Heparin Unfractionated >1.10 (H) 0.30 - 0.70 IU/mL  APTT  Result Value Ref Range   aPTT 110 (H) 24 - 36 seconds  Troponin I (High Sensitivity)  Result Value Ref Range   Troponin I (High Sensitivity) 28 (H) <18 ng/L  Troponin I (High Sensitivity)  Result Value Ref Range   Troponin I (High Sensitivity) 26 (H) <18 ng/L  Troponin I (High Sensitivity)  Result Value Ref Range   Troponin I (High Sensitivity) 24 (H) <18 ng/L  Troponin I  (High Sensitivity)  Result Value Ref Range   Troponin I (High Sensitivity) 27 (H) <18 ng/L      Assessment & Plan:   Problem List Items Addressed This Visit     Venous insufficiency of both lower extremities   Centrilobular emphysema (HCC) - Primary   Relevant Medications   predniSONE (DELTASONE) 10 MG tablet   BREZTRI AEROSPHERE 160-9-4.8 MCG/ACT AERO   Benign hypertension with CKD (chronic kidney disease) stage III (HCC)   Other Visit Diagnoses     Needs flu shot       Relevant Orders   Flu Vaccine QUAD High Dose(Fluad) (Completed)   Acute pain of right shoulder       Relevant Medications   predniSONE (DELTASONE) 10 MG tablet      Consistent with mild COPD flare today Continue supplemental oxygen  Flu Shot today  Stop Stiolto inhaler Start new sample Breztri 2 puffs twice a day - sample is good for 1 week. Then I have printed a new order you can go pick up if you like this new inhaler.  Start Prednisone taper 6 days for cough and shoulder.  HTN controlled mostly at this time off med, home readings reviewed Stay OFF Lisinopril 10mg  for blood pressure, since BP is doing well. No new changes. May take Furosemide lasix as needed for swelling.  Follow back up with Dr Raul Del pulmonology [facility] in June 2023.  Meds ordered this encounter  Medications   predniSONE (DELTASONE) 10 MG tablet    Sig: Take 6 tabs with breakfast Day 1, 5 tabs Day 2, 4 tabs Day 3, 3 tabs Day 4, 2 tabs Day 5, 1 tab Day 6.    Dispense:  21 tablet    Refill:  0   BREZTRI AEROSPHERE 160-9-4.8 MCG/ACT AERO    Sig: Inhale 2 puffs into the lungs 2 (two) times daily.    Dispense:  10.7 g    Refill:  5      Follow up plan: Return in about 4 months (around 11/04/2021) for 4 month follow-up COPD inhaler update, HTN/edema.  [name], DO [facility] 07/07/2021, 11:08 AM

## 2021-07-08 ENCOUNTER — Telehealth (INDEPENDENT_AMBULATORY_CARE_PROVIDER_SITE_OTHER): Payer: Self-pay

## 2021-07-08 NOTE — Telephone Encounter (Signed)
Kristin [name] from [place] left a voicemail requesting verbal orders to continue with unna wrap 1 time a week. Verbal orders are fine to continue per Eulogio Ditch NP.

## 2021-07-09 ENCOUNTER — Encounter (INDEPENDENT_AMBULATORY_CARE_PROVIDER_SITE_OTHER): Payer: Self-pay | Admitting: Nurse Practitioner

## 2021-07-09 ENCOUNTER — Other Ambulatory Visit: Payer: Self-pay

## 2021-07-09 ENCOUNTER — Ambulatory Visit (INDEPENDENT_AMBULATORY_CARE_PROVIDER_SITE_OTHER): Payer: Medicare HMO | Admitting: Nurse Practitioner

## 2021-07-09 VITALS — BP 98/62 | HR 87 | Ht 67.0 in | Wt 159.0 lb

## 2021-07-09 DIAGNOSIS — L97329 Non-pressure chronic ulcer of left ankle with unspecified severity: Secondary | ICD-10-CM | POA: Diagnosis not present

## 2021-07-09 DIAGNOSIS — I83223 Varicose veins of left lower extremity with both ulcer of ankle and inflammation: Secondary | ICD-10-CM | POA: Diagnosis not present

## 2021-07-09 DIAGNOSIS — I129 Hypertensive chronic kidney disease with stage 1 through stage 4 chronic kidney disease, or unspecified chronic kidney disease: Secondary | ICD-10-CM | POA: Diagnosis not present

## 2021-07-09 DIAGNOSIS — N183 Chronic kidney disease, stage 3 unspecified: Secondary | ICD-10-CM

## 2021-07-12 ENCOUNTER — Encounter (INDEPENDENT_AMBULATORY_CARE_PROVIDER_SITE_OTHER): Payer: Self-pay | Admitting: Nurse Practitioner

## 2021-07-12 NOTE — Progress Notes (Signed)
Subjective:    [name], female    DOB: [birth year], 87 y.o.   MRN: [id number] Chief Complaint  Patient presents with   Follow-up    Lt unna boot check     Ms. Piggott is an 87 year old female that presents today for follow-up patient is her lower extremity ulceration following 4 weeks of Unna wraps.  Ulceration does appear improved.  This visit was also conducted with the patient's granddaughter on the phone with the patient's permission.  There is some area of heavy scabbing.  The patient's granddaughter notes that she is not able to wear compression stockings on a regular basis as they are difficult for her to place.  The patient does not place her compression socks as she begins to swell shortly thereafter.  She denies any fevers or chills.   Review of Systems  Cardiovascular:  Positive for leg swelling.  Skin:  Positive for wound.  All other systems reviewed and are negative.     Objective:   Physical Exam Vitals reviewed.  HENT:     Head: Normocephalic.  Cardiovascular:     Rate and Rhythm: Normal rate.     Comments: Nonpalpable pulses Pulmonary:     Effort: Pulmonary effort is normal.  Skin:    General: Skin is warm and dry.  Neurological:     Mental Status: She is alert and oriented to person, place, and time.     Gait: Gait abnormal.  Psychiatric:        Mood and Affect: Mood normal.        Behavior: Behavior normal.        Thought Content: Thought content normal.        Judgment: Judgment normal.    BP 98/62   Pulse 87   Ht 5\' 7"  (1.702 m)   Wt 159 lb (72.1 kg)   BMI 24.90 kg/m   Past Medical History:  Diagnosis Date   Acute postoperative pain 02/09/2018   Arrhythmia, sinus node 04/03/2014   Arthritis    Arthritis    Atrial fibrillation (HCC)    Back pain    lower back chronic   Bradycardia    Breast cancer ([place]) 2004   right breast cancer   Cancer ([place]) 2004   rt breast cancer-post lumpectomy- chemo/rad   CHF (congestive heart  failure) (HCC)    Chronic back pain    CKD (chronic kidney disease)    COPD (chronic obstructive pulmonary disease) ([place])    wears O2 at 2L via [place] at night   Cystocele    Decubitus ulcers    Dehydration    Gout    Hematuria    Hepatitis C    Hyperlipidemia    Hypertension    Hypomagnesemia 06/25/2015   OAB (overactive bladder)    Overactive bladder    Restless leg    Shoulder pain, bilateral    Urinary frequency    UTI (lower urinary tract infection)    Vaginal atrophy     Social History   Socioeconomic History   Marital status: Single    Spouse name: Not on file   Number of children: 2   Years of education: 12   Highest education level: 12th grade  Occupational History   Occupation: retired  Tobacco Use   Smoking status: Former    Packs/day: 1.25    Years: 40.00    Pack years: 50.00    Types: Cigarettes    Quit date: 12/16/1998  Years since quitting: 22.5   Smokeless tobacco: Former  Scientific laboratory technician Use: Never used  Substance and Sexual Activity   Alcohol use: No    Alcohol/week: 0.0 standard drinks   Drug use: Yes    Types: Oxycodone   Sexual activity: Not Currently    Birth control/protection: Surgical  Other Topics Concern   Not on file  Social History Narrative   Not on file   Social Determinants of Health   Financial Resource Strain: Not on file  Food Insecurity: No Food Insecurity   Worried About Running Out of Food in the Last Year: Never true   Ran Out of Food in the Last Year: Never true  Transportation Needs: No Transportation Needs   Lack of Transportation (Medical): No   Lack of Transportation (Non-Medical): No  Physical Activity: Not on file  Stress: Not on file  Social Connections: Not on file  Intimate Partner Violence: Not on file    Past Surgical History:  Procedure Laterality Date   ABDOMINAL HYSTERECTOMY     BACK SURGERY     BREAST LUMPECTOMY Right 2004   BREAST SURGERY     rt lumpectomy   CARPAL TUNNEL RELEASE      CATARACT EXTRACTION W/PHACO  10/19/2011   Procedure: CATARACT EXTRACTION PHACO AND INTRAOCULAR LENS PLACEMENT (IOC);  Surgeon: Elta Guadeloupe T. Gershon Crane, MD;  Location: [facility];  Service: Ophthalmology;  Laterality: Right;  CDE:10.81   CATARACT EXTRACTION W/PHACO  11/02/2011   Procedure: CATARACT EXTRACTION PHACO AND INTRAOCULAR LENS PLACEMENT (IOC);  Surgeon: Elta Guadeloupe T. Gershon Crane, MD;  Location: [facility];  Service: Ophthalmology;  Laterality: Left;  CDE 8.60   CHOLECYSTECTOMY     3/18   COLON SURGERY     INSERT / REPLACE / REMOVE PACEMAKER     JOINT REPLACEMENT     bilateral TKA   LOWER EXTREMITY ANGIOGRAPHY Left 10/14/2020   Procedure: LOWER EXTREMITY ANGIOGRAPHY;  Surgeon: Katha Cabal, MD;  Location: [facility];  Service: Cardiovascular;  Laterality: Left;   PACEMAKER INSERTION N/A 12/24/2015   Procedure: INSERTION PACEMAKER;  Surgeon: Isaias Cowman, MD;  Location: [facility];  Service: Cardiovascular;  Laterality: N/A;   ROTATOR CUFF REPAIR     bilateral    Family History  Problem Relation Age of Onset   Kidney disease Brother    Heart disease Father    Heart disease Mother    Stroke Mother    Cancer Sister    Breast cancer Sister 65   Breast cancer Paternal Aunt    Anesthesia problems Neg Hx    Hypotension Neg Hx    Malignant hyperthermia Neg Hx    Pseudochol deficiency Neg Hx     Allergies  Allergen Reactions   Flexeril [Cyclobenzaprine Hcl]     Severe Rash   Vancomycin Rash    [place] Syndrome    CBC Latest Ref Rng & Units 04/08/2021 04/07/2021 04/02/2021  WBC 4.0 - 10.5 K/uL 9.5 9.6 9.9  Hemoglobin 12.0 - 15.0 g/dL 12.8 12.6 13.2  Hematocrit 36.0 - 46.0 % 40.2 39.3 41.8  Platelets 150 - 400 K/uL 138(L) 152 184      CMP     Component Value Date/Time   NA 138 04/08/2021 0805   NA 138 09/06/2014 2215   K 5.0 04/08/2021 0805   K 4.4 09/06/2014 2215   CL 112 (H) 04/08/2021 0805   CL 106 09/06/2014 2215   CO2 19 (L) 04/08/2021 0805   CO2  23 09/06/2014 2215    GLUCOSE 73 04/08/2021 0805   GLUCOSE 92 09/06/2014 2215   BUN 60 (H) 04/08/2021 0805   BUN 25 (H) 09/06/2014 2215   CREATININE 1.70 (H) 04/08/2021 0805   CREATININE 1.40 (H) 04/02/2021 0916   CALCIUM 9.3 04/08/2021 0805   CALCIUM 9.0 09/06/2014 2215   PROT 7.0 11/07/2020 2002   PROT 7.1 07/10/2014 1219   ALBUMIN 3.7 11/07/2020 2002   ALBUMIN 3.2 (L) 07/10/2014 1219   AST 21 11/07/2020 2002   AST 26 07/10/2014 1219   ALT 16 11/07/2020 2002   ALT 15 07/10/2014 1219   ALKPHOS 55 11/07/2020 2002   ALKPHOS 69 07/10/2014 1219   BILITOT 0.6 11/07/2020 2002   BILITOT 0.4 07/10/2014 1219   GFRNONAA 28 (L) 04/08/2021 0805   GFRNONAA 45 (L) 10/02/2020 1106   GFRAA 52 (L) 10/02/2020 1106     ABI WITH/WO TBI  Result Date: 06/18/2021  LOWER EXTREMITY DOPPLER STUDY Patient Name:  DAREN DOSWELL  Date of Exam:   06/04/2021 Medical Rec #: [id number]      Accession #:    [id number] Date of Birth: 26-Aug-1929       Patient Gender: F Patient Age:   3 years Exam Location:  [facility] Procedure:      VAS [name] ABI WITH/WO TBI Referring Phys: GREGORY SCHNIER --------------------------------------------------------------------------------  Indications: Peripheral artery disease.  Vascular Interventions: 10/14/2020: PTA of the Left SFA and Popliteal Artery.                         PTA of the Left Posterior tibial Artery and                         Tibioperoneal trunk. PTA and Stent placement Left EIA. Comparison Study: 03/06/2021 Performing Technologist: Charlane Ferretti RT (R)(VS)  Examination Guidelines: A complete evaluation includes at minimum, Doppler waveform signals and systolic blood pressure reading at the level of bilateral brachial, anterior tibial, and posterior tibial arteries, when vessel segments are accessible. Bilateral testing is considered an integral part of a complete examination. Photoelectric Plethysmograph (PPG) waveforms and toe systolic pressure readings are included as required  and additional duplex testing as needed. Limited examinations for reoccurring indications may be performed as noted.  ABI Findings: +---------+------------------+-----+----------+-------------+ Right    Rt Pressure (mmHg)IndexWaveform  Comment       +---------+------------------+-----+----------+-------------+ Brachial                                  Breast cancer +---------+------------------+-----+----------+-------------+ ATA      111                    monophasic              +---------+------------------+-----+----------+-------------+ PTA      117                    monophasic              +---------+------------------+-----+----------+-------------+ Delbert Harness                     Abnormal                +---------+------------------+-----+----------+-------------+ +---------+------------------+-----+----------+-------+ Left     Lt Pressure (mmHg)IndexWaveform  Comment +---------+------------------+-----+----------+-------+ Brachial  133     +---------+------------------+-----+----------+-------+ ATA      151                    monophasic        +---------+------------------+-----+----------+-------+ PTA      170                    monophasic        +---------+------------------+-----+----------+-------+ Great Toe62                     Abnormal          +---------+------------------+-----+----------+-------+ +-------+-----------+-----------+------------+------------+ ABI/TBIToday's ABIToday's TBIPrevious ABIPrevious TBI +-------+-----------+-----------+------------+------------+ Right  .88        .58        .91         .41          +-------+-----------+-----------+------------+------------+ Left   1.28       .47                    .70          +-------+-----------+-----------+------------+------------+ Right ABIs appear essentially unchanged compared to prior study on 03/06/2021. Right TBIs appear  essentially unchanged compared to prior study on 03/06/2021.  Summary: Right: Resting right ankle-brachial index indicates mild right lower extremity arterial disease. The right toe-brachial index is abnormal. Left: Resting left ankle-brachial index is within normal range. No evidence of significant left lower extremity arterial disease. The left toe-brachial index is abnormal. Left TBI appears decreased as compared to the previous exam on 03/06/2021.  *See table(s) above for measurements and observations.  Electronically signed by Hortencia Pilar MD on 06/18/2021 at 4:42:53 PM.    Final        Assessment & Plan:   1. 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  In we will continue to have the patient placed in [place] wraps to see if there is continued improvement of the wound.  If there is not over there is deterioration we may discuss angiogram and her follow-up in the morning.   2. Benign hypertension with CKD (chronic kidney disease) stage III (HCC) Continue antihypertensive medications as already ordered, these medications have been reviewed and there are no changes at this time.    Current Outpatient Medications on File Prior to Visit  Medication Sig Dispense Refill   acetaminophen (TYLENOL) 500 MG tablet Take 500 mg by mouth in the morning and at bedtime.     albuterol (PROVENTIL) (2.5 MG/3ML) 0.083% nebulizer solution USE 1 VIAL IN NEBULIZER EVERY 8 HOURS AS NEEDED FOR WHEEZING OR SHORTNESS OF BREATH. 75 mL 3   apixaban (ELIQUIS) 2.5 MG TABS tablet Take 1 tablet (2.5 mg total) by mouth 2 (two) times daily. 60 tablet 0   aspirin EC 81 MG tablet Take 1 tablet (81 mg total) by mouth daily. Swallow whole. 150 tablet 2   atorvastatin (LIPITOR) 20 MG tablet TAKE 1 TABLET BY MOUTH ONCE DAILY FOR  CHOLESTEROL 30 tablet 11   bisacodyl (DULCOLAX) 5 MG EC tablet Take 2 tablets (10 mg total) by mouth daily as needed for moderate constipation. 180 tablet 1   BREZTRI AEROSPHERE 160-9-4.8 MCG/ACT AERO Inhale 2 puffs into the lungs 2 (two) times daily. 10.7 g  5   Cholecalciferol (VITAMIN D) 2000 units tablet Take 2,000 Units by mouth daily.      gabapentin (NEURONTIN) 100 MG capsule TAKE 3 CAPSULES BY MOUTH AT BEDTIME 270 capsule 0   levocetirizine (XYZAL) 5 MG tablet TAKE (1/2) TABLET BY MOUTH EVERY OTHER DAY. 8 tablet 9   Melatonin 10 MG TABS Take 10 mg by mouth at bedtime.     oxyCODONE (OXY IR/ROXICODONE) 5 MG immediate release tablet Take 2 tablets (10 mg total) by mouth 2 (two) times daily. May also take 1 tablet (5 mg total) at bedtime as needed for severe pain. Must last 30 days.. 150 tablet 0   [START ON 07/26/2021] oxyCODONE (OXY IR/ROXICODONE) 5 MG immediate release tablet Take 2 tablets (10 mg total) by mouth 2 (two) times daily. May also take 1 tablet (5 mg total) at bedtime as needed for severe pain. Must last 30 days.. 150 tablet 0   OXYGEN Inhale 2 L into the lungs at bedtime.      predniSONE (DELTASONE) 10 MG tablet Take 6 tabs with breakfast Day 1, 5 tabs Day 2, 4 tabs Day 3, 3 tabs Day 4, 2 tabs Day 5, 1 tab Day 6. 21 tablet 0   rOPINIRole (REQUIP) 2 MG tablet TAKE 2 TABLETS BY MOUTH AT BEDTIME 180 tablet 1   oxyCODONE (OXY IR/ROXICODONE) 5 MG immediate release tablet Take 2 tablets (10 mg total) by mouth 2 (two) times daily. May also take 1 tablet (5 mg total) at bedtime as needed for severe pain. Must last 30 days.. 150 tablet 0   No current facility-administered medications on file prior to visit.    There are no Patient Instructions on file for this visit. No follow-ups on file.   Kris Hartmann, NP

## 2021-07-13 DIAGNOSIS — J45998 Other asthma: Secondary | ICD-10-CM | POA: Diagnosis not present

## 2021-07-17 DIAGNOSIS — J449 Chronic obstructive pulmonary disease, unspecified: Secondary | ICD-10-CM | POA: Diagnosis not present

## 2021-07-17 DIAGNOSIS — I890 Lymphedema, not elsewhere classified: Secondary | ICD-10-CM | POA: Diagnosis not present

## 2021-07-17 DIAGNOSIS — L97321 Non-pressure chronic ulcer of left ankle limited to breakdown of skin: Secondary | ICD-10-CM | POA: Diagnosis not present

## 2021-07-17 DIAGNOSIS — I509 Heart failure, unspecified: Secondary | ICD-10-CM | POA: Diagnosis not present

## 2021-07-17 DIAGNOSIS — I4891 Unspecified atrial fibrillation: Secondary | ICD-10-CM | POA: Diagnosis not present

## 2021-07-17 DIAGNOSIS — M1030 Gout due to renal impairment, unspecified site: Secondary | ICD-10-CM | POA: Diagnosis not present

## 2021-07-17 DIAGNOSIS — N189 Chronic kidney disease, unspecified: Secondary | ICD-10-CM | POA: Diagnosis not present

## 2021-07-17 DIAGNOSIS — I130 Hypertensive heart and chronic kidney disease with heart failure and stage 1 through stage 4 chronic kidney disease, or unspecified chronic kidney disease: Secondary | ICD-10-CM | POA: Diagnosis not present

## 2021-07-17 DIAGNOSIS — I872 Venous insufficiency (chronic) (peripheral): Secondary | ICD-10-CM | POA: Diagnosis not present

## 2021-07-18 ENCOUNTER — Other Ambulatory Visit: Payer: Self-pay

## 2021-07-18 ENCOUNTER — Emergency Department
Admission: EM | Admit: 2021-07-18 | Discharge: 2021-07-18 | Disposition: A | Discharge status: Home / Self Care | Payer: Medicare HMO | Attending: Emergency Medicine | Admitting: Emergency Medicine

## 2021-07-18 ENCOUNTER — Encounter: Payer: Self-pay | Admitting: Intensive Care

## 2021-07-18 ENCOUNTER — Emergency Department: Payer: Medicare HMO

## 2021-07-18 DIAGNOSIS — Z5321 Procedure and treatment not carried out due to patient leaving prior to being seen by health care provider: Secondary | ICD-10-CM | POA: Insufficient documentation

## 2021-07-18 DIAGNOSIS — Z20822 Contact with and (suspected) exposure to covid-19: Secondary | ICD-10-CM | POA: Diagnosis not present

## 2021-07-18 DIAGNOSIS — R531 Weakness: Secondary | ICD-10-CM | POA: Diagnosis not present

## 2021-07-18 DIAGNOSIS — R42 Dizziness and giddiness: Secondary | ICD-10-CM | POA: Insufficient documentation

## 2021-07-18 DIAGNOSIS — J439 Emphysema, unspecified: Secondary | ICD-10-CM | POA: Diagnosis not present

## 2021-07-18 DIAGNOSIS — M545 Low back pain, unspecified: Secondary | ICD-10-CM | POA: Diagnosis not present

## 2021-07-18 DIAGNOSIS — R0602 Shortness of breath: Secondary | ICD-10-CM | POA: Insufficient documentation

## 2021-07-18 LAB — BASIC METABOLIC PANEL
Anion gap: 5 (ref 5–15)
BUN: 35 mg/dL — ABNORMAL HIGH (ref 8–23)
CO2: 23 mmol/L (ref 22–32)
Calcium: 8.6 mg/dL — ABNORMAL LOW (ref 8.9–10.3)
Chloride: 110 mmol/L (ref 98–111)
Creatinine, Ser: 1.44 mg/dL — ABNORMAL HIGH (ref 0.44–1.00)
GFR, Estimated: 35 mL/min — ABNORMAL LOW (ref >60)
Glucose, Bld: 99 mg/dL (ref 70–99)
Potassium: 4.1 mmol/L (ref 3.5–5.1)
Sodium: 138 mmol/L (ref 135–145)

## 2021-07-18 LAB — CBC WITH DIFFERENTIAL/PLATELET
Abs Immature Granulocytes: 0.02 10*3/uL (ref 0.00–0.07)
Basophils Absolute: 0.1 10*3/uL (ref 0.0–0.1)
Basophils Relative: 1 %
Eosinophils Absolute: 0.7 10*3/uL — ABNORMAL HIGH (ref 0.0–0.5)
Eosinophils Relative: 10 %
HCT: 38.5 % (ref 36.0–46.0)
Hemoglobin: 12 g/dL (ref 12.0–15.0)
Immature Granulocytes: 0 %
Lymphocytes Relative: 34 %
Lymphs Abs: 2.5 10*3/uL (ref 0.7–4.0)
MCH: 30.1 pg (ref 26.0–34.0)
MCHC: 31.2 g/dL (ref 30.0–36.0)
MCV: 96.5 fL (ref 80.0–100.0)
Monocytes Absolute: 0.8 10*3/uL (ref 0.1–1.0)
Monocytes Relative: 10 %
Neutro Abs: 3.4 10*3/uL (ref 1.7–7.7)
Neutrophils Relative %: 45 %
Platelets: 208 10*3/uL (ref 150–400)
RBC: 3.99 MIL/uL (ref 3.87–5.11)
RDW: 14.1 % (ref 11.5–15.5)
WBC: 7.5 10*3/uL (ref 4.0–10.5)
nRBC: 0 % (ref 0.0–0.2)

## 2021-07-18 LAB — RESP PANEL BY RT-PCR (FLU A&B, COVID) ARPGX2
Influenza A by PCR: NEGATIVE
Influenza B by PCR: NEGATIVE
SARS Coronavirus 2 by RT PCR: NEGATIVE

## 2021-07-18 LAB — TROPONIN I (HIGH SENSITIVITY)
Troponin I (High Sensitivity): 23 ng/L — ABNORMAL HIGH (ref <18)
Troponin I (High Sensitivity): 22 ng/L — ABNORMAL HIGH (ref <18)

## 2021-07-18 LAB — BRAIN NATRIURETIC PEPTIDE: B Natriuretic Peptide: 593.2 pg/mL — ABNORMAL HIGH (ref 0.0–100.0)

## 2021-07-18 NOTE — ED Triage Notes (Signed)
Patient c/o weakness, dizziness, sob, and back pain that started yesterday. Wears 2L O2 continuous and reports sob worse than normal.

## 2021-07-18 NOTE — ED Provider Notes (Signed)
Emergency Medicine Provider Triage Evaluation Note  Kristin [name], a 87 y.o. female  was evaluated in triage.  Pt complains of generalized weakness, dizziness, shortness of breath, and back pain.  Patient reports onset of symptoms yesterday.  Patient has a history of chronic pain, is under the care of of [place] pain clinic.  She denies any fevers, nausea, vomiting, or dysuria.  Patient wears O2 at home.  Review of Systems  Positive: Weakness, SOB, LBP Negative: FCS  Physical Exam  BP 108/75 (BP Location: Left Arm)   Pulse 67   Temp 98.4 F (36.9 C) (Oral)   Resp (!) 22   Ht 5\' 7"  (1.702 m)   Wt 72.1 kg   SpO2 94%   BMI 24.90 kg/m  Gen:   Awake, no distress  NAD Resp:  Normal effort CTA MSK:   Moves extremities without difficulty  Other:  CVS: RRR  Medical Decision Making  Medically screening exam initiated at 2:35 PM.  Appropriate orders placed.  [name] was informed that the remainder of the evaluation will be completed by another provider, this initial triage assessment does not replace that evaluation, and the importance of remaining in the ED until their evaluation is complete.  Geriatric patient presents from home with complaints of 1 day of weakness, shortness of breath, dizziness, and low back pain.   Melvenia Needles, PA-C 07/18/21 1438    Vladimir Crofts, MD 07/18/21 1455

## 2021-07-18 NOTE — ED Notes (Signed)
Pt family member to desk to ask about wait time. Pt son informed that she is longest wait but we are unable to give exact wait time.

## 2021-07-18 NOTE — ED Notes (Signed)
Family states the pt decided to leave, due to having restless legs.

## 2021-07-21 ENCOUNTER — Other Ambulatory Visit: Payer: Self-pay

## 2021-07-21 NOTE — Patient Outreach (Addendum)
[facility]) Care Management  07/21/2021  ALANAH SAKUMA April 16, 1930 [id number]   Telephone Assessment    Unsuccessful outreach attempt to patient.    Plan:  RN CM will make quarterly outreach attempt to patient within the month of Feb.   Mihira Tozzi [facility] [place] Management Telephonic Care Management Coordinator Direct Phone: [phone] Toll Free: [phone] Fax: [phone]

## 2021-07-22 ENCOUNTER — Ambulatory Visit: Payer: Self-pay

## 2021-07-24 DIAGNOSIS — L97321 Non-pressure chronic ulcer of left ankle limited to breakdown of skin: Secondary | ICD-10-CM | POA: Diagnosis not present

## 2021-07-24 DIAGNOSIS — N189 Chronic kidney disease, unspecified: Secondary | ICD-10-CM | POA: Diagnosis not present

## 2021-07-24 DIAGNOSIS — I130 Hypertensive heart and chronic kidney disease with heart failure and stage 1 through stage 4 chronic kidney disease, or unspecified chronic kidney disease: Secondary | ICD-10-CM | POA: Diagnosis not present

## 2021-07-24 DIAGNOSIS — M1030 Gout due to renal impairment, unspecified site: Secondary | ICD-10-CM | POA: Diagnosis not present

## 2021-07-24 DIAGNOSIS — I4891 Unspecified atrial fibrillation: Secondary | ICD-10-CM | POA: Diagnosis not present

## 2021-07-24 DIAGNOSIS — J449 Chronic obstructive pulmonary disease, unspecified: Secondary | ICD-10-CM | POA: Diagnosis not present

## 2021-07-24 DIAGNOSIS — I509 Heart failure, unspecified: Secondary | ICD-10-CM | POA: Diagnosis not present

## 2021-07-24 DIAGNOSIS — I890 Lymphedema, not elsewhere classified: Secondary | ICD-10-CM | POA: Diagnosis not present

## 2021-07-24 DIAGNOSIS — I872 Venous insufficiency (chronic) (peripheral): Secondary | ICD-10-CM | POA: Diagnosis not present

## 2021-07-28 ENCOUNTER — Other Ambulatory Visit: Payer: Self-pay | Admitting: Family Medicine

## 2021-07-28 DIAGNOSIS — G2581 Restless legs syndrome: Secondary | ICD-10-CM

## 2021-07-28 NOTE — Telephone Encounter (Signed)
Spoke with pharmacy. States med was refilled today. Last refill available.

## 2021-07-31 ENCOUNTER — Other Ambulatory Visit: Payer: Self-pay

## 2021-07-31 ENCOUNTER — Encounter: Payer: Self-pay | Admitting: Family Medicine

## 2021-07-31 ENCOUNTER — Ambulatory Visit (INDEPENDENT_AMBULATORY_CARE_PROVIDER_SITE_OTHER): Payer: Medicare HMO | Admitting: Family Medicine

## 2021-07-31 VITALS — BP 117/59 | HR 74 | Ht 67.0 in | Wt 157.4 lb

## 2021-07-31 DIAGNOSIS — R531 Weakness: Secondary | ICD-10-CM

## 2021-07-31 DIAGNOSIS — I5032 Chronic diastolic (congestive) heart failure: Secondary | ICD-10-CM

## 2021-07-31 DIAGNOSIS — N189 Chronic kidney disease, unspecified: Secondary | ICD-10-CM | POA: Diagnosis not present

## 2021-07-31 DIAGNOSIS — I130 Hypertensive heart and chronic kidney disease with heart failure and stage 1 through stage 4 chronic kidney disease, or unspecified chronic kidney disease: Secondary | ICD-10-CM | POA: Diagnosis not present

## 2021-07-31 DIAGNOSIS — M1030 Gout due to renal impairment, unspecified site: Secondary | ICD-10-CM | POA: Diagnosis not present

## 2021-07-31 DIAGNOSIS — I4891 Unspecified atrial fibrillation: Secondary | ICD-10-CM | POA: Diagnosis not present

## 2021-07-31 DIAGNOSIS — I872 Venous insufficiency (chronic) (peripheral): Secondary | ICD-10-CM

## 2021-07-31 DIAGNOSIS — L97321 Non-pressure chronic ulcer of left ankle limited to breakdown of skin: Secondary | ICD-10-CM | POA: Diagnosis not present

## 2021-07-31 DIAGNOSIS — I509 Heart failure, unspecified: Secondary | ICD-10-CM | POA: Diagnosis not present

## 2021-07-31 DIAGNOSIS — I890 Lymphedema, not elsewhere classified: Secondary | ICD-10-CM | POA: Diagnosis not present

## 2021-07-31 DIAGNOSIS — J449 Chronic obstructive pulmonary disease, unspecified: Secondary | ICD-10-CM | POA: Diagnosis not present

## 2021-07-31 MED ORDER — FUROSEMIDE 20 MG PO TABS
10.0000 mg | ORAL_TABLET | Freq: Every day | ORAL | 2 refills | Status: DC | PRN
Start: 2021-07-31 — End: 2021-09-22

## 2021-07-31 NOTE — Patient Instructions (Addendum)
Thank you for coming to the office today.  Re ordered Lasix take as needed.  BP is good. Keep an eye on it, if >160-170 + may need other medication in future  Keep up with vascular specialist for unna wrap.   Please schedule a Follow-up Appointment to: Return if symptoms worsen or fail to improve.  If you have any other questions or concerns, please feel free to call the office or send a message through [facility]. You may also schedule an earlier appointment if necessary.  Additionally, you may be receiving a survey about your experience at our office within a few days to 1 week by e-mail or mail. We value your feedback.  [name], DO [place]

## 2021-07-31 NOTE — Progress Notes (Signed)
Subjective:    [name], female    DOB: 06-21-1930, 87 y.o.   MRN: [id number]  Kristin [name] is a 87 y.o. female presenting on 07/31/2021 for Hospitalization Follow-up   HPI  ED FOLLOW-UP VISIT  Hospital/Location: [place] Date of ED Visit: 07/18/21  Reason for Presenting to ED: Weakness, Leg Swelling  FOLLOW-UP  - ED provider note and record have been reviewed - Patient presents today about 13 days after recent ED visit. Brief summary of recent course, patient had symptoms of leg swelling, she was up on her feet regularly leading up to this. She has seen Vascular specialist and they are doing UNNA wrap for lower ankle ulceration that are healing.  Leg swelling improved, 3 x days on furosemide, varicose veins  Admits generalized pain and aches  She left after triage and lab work. Abnormal troponin mild. Thought to be demand ischemia.   Had Chest X-ray and other labs.  She left without being seen by ED provider  I have reviewed the discharge medication list, and have reconciled the current and discharge medications today.    Depression screen [facility] 2/9 06/09/2021 03/04/2021 01/01/2021  Decreased Interest 0 0 0  Down, Depressed, Hopeless 0 0 0  PHQ - 2 Score 0 0 0  Altered sleeping - - -  Tired, decreased energy - - -  Change in appetite - - -  Feeling bad or failure about yourself  - - -  Trouble concentrating - - -  Moving slowly or fidgety/restless - - -  Suicidal thoughts - - -  PHQ-9 Score - - -  Difficult doing work/chores - - -  Some recent data might be hidden    Social History   Tobacco Use   Smoking status: Former    Packs/day: 1.25    Years: 40.00    Pack years: 50.00    Types: Cigarettes    Quit date: 12/16/1998    Years since quitting: 22.6   Smokeless tobacco: Former  Scientific laboratory technician Use: Never used  Substance Use Topics   Alcohol use: No    Alcohol/week: 0.0 standard drinks   Drug use: Yes    Types: Oxycodone    Review of  Systems Per HPI unless specifically indicated above     Objective:    BP (!) 117/59   Pulse 74   Ht 5\' 7"  (1.702 m)   Wt 157 lb 6.4 oz (71.4 kg)   SpO2 93%   BMI 24.65 kg/m   Wt Readings from Last 3 Encounters:  07/31/21 157 lb 6.4 oz (71.4 kg)  07/18/21 159 lb (72.1 kg)  07/09/21 159 lb (72.1 kg)    Physical Exam Vitals and nursing note reviewed.  Constitutional:      General: She is not in acute distress.    Appearance: Normal appearance. She is well-developed. She is not diaphoretic.     Comments: Well-appearing, comfortable, cooperative  HENT:     Head: Normocephalic and atraumatic.  Eyes:     General:        Right eye: No discharge.        Left eye: No discharge.     Conjunctiva/sclera: Conjunctivae normal.  Cardiovascular:     Rate and Rhythm: Normal rate.  Pulmonary:     Effort: Pulmonary effort is normal.  Musculoskeletal:     Right lower leg: Edema present.     Left lower leg: Edema present.     Comments:  Improved chronic ulcer left lower leg  Edema is +1 pitting  Skin:    General: Skin is warm and dry.     Findings: No erythema or rash.  Neurological:     Mental Status: She is alert and oriented to person, place, and time.  Psychiatric:        Mood and Affect: Mood normal.        Behavior: Behavior normal.        Thought Content: Thought content normal.     Comments: Well groomed, good eye contact, normal speech and thoughts    I have personally reviewed the radiology report from 07/18/21 on CXR.  DG Chest 2 ViewPerformed 07/18/2021 Final result  Study Result CLINICAL DATA: Shortness of breath.  EXAM: CHEST - 2 VIEW  COMPARISON: Chest radiograph 04/07/2021; CT chest 01/08/2021  FINDINGS: Diffuse emphysematous changes with reticulation at the lung bases. New right perihilar and bibasilar interstitial thickening. No pleural effusion or pneumothorax. Cardiomediastinal silhouette is stable. Aortic calcifications. Left chest wall pacemaker  with leads overlying the right atrium and right ventricle. Bilateral total shoulder arthroplasties. Surgical clips overlie the right axilla and right upper abdomen.  IMPRESSION: 1. Interval development of right perihilar and bibasilar interstitial thickening which could reflect pulmonary edema versus infection in the appropriate clinical setting. 2. Aortic Atherosclerosis (ICD10-I70.0) and Emphysema (ICD10-J43.9).   Electronically Signed By: Ileana Roup M.D. On: 07/18/2021 15:23    Results for orders placed or performed during the hospital encounter of 07/18/21  Resp Panel by RT-PCR (Flu A&B, Covid) Nasopharyngeal Swab   Specimen: Nasopharyngeal Swab; Nasopharyngeal(NP) swabs in vial transport medium  Result Value Ref Range   SARS Coronavirus 2 by RT PCR NEGATIVE NEGATIVE   Influenza A by PCR NEGATIVE NEGATIVE   Influenza B by PCR NEGATIVE NEGATIVE  CBC with Differential  Result Value Ref Range   WBC 7.5 4.0 - 10.5 K/uL   RBC 3.99 3.87 - 5.11 MIL/uL   Hemoglobin 12.0 12.0 - 15.0 g/dL   HCT 38.5 36.0 - 46.0 %   MCV 96.5 80.0 - 100.0 fL   MCH 30.1 26.0 - 34.0 pg   MCHC 31.2 30.0 - 36.0 g/dL   RDW 14.1 11.5 - 15.5 %   Platelets 208 150 - 400 K/uL   nRBC 0.0 0.0 - 0.2 %   Neutrophils Relative % 45 %   Neutro Abs 3.4 1.7 - 7.7 K/uL   Lymphocytes Relative 34 %   Lymphs Abs 2.5 0.7 - 4.0 K/uL   Monocytes Relative 10 %   Monocytes Absolute 0.8 0.1 - 1.0 K/uL   Eosinophils Relative 10 %   Eosinophils Absolute 0.7 (H) 0.0 - 0.5 K/uL   Basophils Relative 1 %   Basophils Absolute 0.1 0.0 - 0.1 K/uL   Immature Granulocytes 0 %   Abs Immature Granulocytes 0.02 0.00 - 0.07 K/uL  Basic metabolic panel  Result Value Ref Range   Sodium 138 135 - 145 mmol/L   Potassium 4.1 3.5 - 5.1 mmol/L   Chloride 110 98 - 111 mmol/L   CO2 23 22 - 32 mmol/L   Glucose, Bld 99 70 - 99 mg/dL   BUN 35 (H) 8 - 23 mg/dL   Creatinine, Ser 1.44 (H) 0.44 - 1.00 mg/dL   Calcium 8.6 (L) 8.9 - 10.3  mg/dL   GFR, Estimated 35 (L) >60 mL/min   Anion gap 5 5 - 15  Brain natriuretic peptide  Result Value Ref Range   B Natriuretic Peptide  593.2 (H) 0.0 - 100.0 pg/mL  Troponin I (High Sensitivity)  Result Value Ref Range   Troponin I (High Sensitivity) 23 (H) <18 ng/L  Troponin I (High Sensitivity)  Result Value Ref Range   Troponin I (High Sensitivity) 22 (H) <18 ng/L      Assessment & Plan:   Problem List Items Addressed This Visit     Venous insufficiency of both lower extremities - Primary   Relevant Medications   furosemide (LASIX) 20 MG tablet   Chronic diastolic CHF (congestive heart failure) (HCC)   Relevant Medications   furosemide (LASIX) 20 MG tablet   Other Visit Diagnoses     Generalized weakness           Reviewed ED triage notes and visit / labs  Re ordered Lasix take as needed. Restart regularly may adjust dose PRN  BP is good. Keep an eye on it, if >160-170 + may need other medication in future  Keep up with vascular specialist for unna wrap.   Meds ordered this encounter  Medications   furosemide (LASIX) 20 MG tablet    Sig: Take 0.5-1 tablets (10-20 mg total) by mouth daily as needed for edema or fluid.    Dispense:  30 tablet    Refill:  2      Follow up plan: Return if symptoms worsen or fail to improve.    [name], DO [facility] 07/31/2021, 11:13 AM

## 2021-08-06 DIAGNOSIS — M1030 Gout due to renal impairment, unspecified site: Secondary | ICD-10-CM | POA: Diagnosis not present

## 2021-08-06 DIAGNOSIS — I890 Lymphedema, not elsewhere classified: Secondary | ICD-10-CM | POA: Diagnosis not present

## 2021-08-06 DIAGNOSIS — I4891 Unspecified atrial fibrillation: Secondary | ICD-10-CM | POA: Diagnosis not present

## 2021-08-06 DIAGNOSIS — N189 Chronic kidney disease, unspecified: Secondary | ICD-10-CM | POA: Diagnosis not present

## 2021-08-06 DIAGNOSIS — J449 Chronic obstructive pulmonary disease, unspecified: Secondary | ICD-10-CM | POA: Diagnosis not present

## 2021-08-06 DIAGNOSIS — I130 Hypertensive heart and chronic kidney disease with heart failure and stage 1 through stage 4 chronic kidney disease, or unspecified chronic kidney disease: Secondary | ICD-10-CM | POA: Diagnosis not present

## 2021-08-06 DIAGNOSIS — L97321 Non-pressure chronic ulcer of left ankle limited to breakdown of skin: Secondary | ICD-10-CM | POA: Diagnosis not present

## 2021-08-06 DIAGNOSIS — I872 Venous insufficiency (chronic) (peripheral): Secondary | ICD-10-CM | POA: Diagnosis not present

## 2021-08-06 DIAGNOSIS — I509 Heart failure, unspecified: Secondary | ICD-10-CM | POA: Diagnosis not present

## 2021-08-07 ENCOUNTER — Ambulatory Visit (INDEPENDENT_AMBULATORY_CARE_PROVIDER_SITE_OTHER): Payer: Medicare HMO | Admitting: Nurse Practitioner

## 2021-08-07 ENCOUNTER — Encounter (INDEPENDENT_AMBULATORY_CARE_PROVIDER_SITE_OTHER): Payer: Medicare HMO

## 2021-08-10 DIAGNOSIS — M1030 Gout due to renal impairment, unspecified site: Secondary | ICD-10-CM | POA: Diagnosis not present

## 2021-08-10 DIAGNOSIS — L97321 Non-pressure chronic ulcer of left ankle limited to breakdown of skin: Secondary | ICD-10-CM | POA: Diagnosis not present

## 2021-08-10 DIAGNOSIS — I509 Heart failure, unspecified: Secondary | ICD-10-CM | POA: Diagnosis not present

## 2021-08-10 DIAGNOSIS — I130 Hypertensive heart and chronic kidney disease with heart failure and stage 1 through stage 4 chronic kidney disease, or unspecified chronic kidney disease: Secondary | ICD-10-CM | POA: Diagnosis not present

## 2021-08-10 DIAGNOSIS — I4891 Unspecified atrial fibrillation: Secondary | ICD-10-CM | POA: Diagnosis not present

## 2021-08-10 DIAGNOSIS — I890 Lymphedema, not elsewhere classified: Secondary | ICD-10-CM | POA: Diagnosis not present

## 2021-08-10 DIAGNOSIS — N189 Chronic kidney disease, unspecified: Secondary | ICD-10-CM | POA: Diagnosis not present

## 2021-08-10 DIAGNOSIS — J449 Chronic obstructive pulmonary disease, unspecified: Secondary | ICD-10-CM | POA: Diagnosis not present

## 2021-08-10 DIAGNOSIS — I872 Venous insufficiency (chronic) (peripheral): Secondary | ICD-10-CM | POA: Diagnosis not present

## 2021-08-11 DIAGNOSIS — Z961 Presence of intraocular lens: Secondary | ICD-10-CM | POA: Diagnosis not present

## 2021-08-11 DIAGNOSIS — H524 Presbyopia: Secondary | ICD-10-CM | POA: Diagnosis not present

## 2021-08-11 DIAGNOSIS — H52203 Unspecified astigmatism, bilateral: Secondary | ICD-10-CM | POA: Diagnosis not present

## 2021-08-11 DIAGNOSIS — H353131 Nonexudative age-related macular degeneration, bilateral, early dry stage: Secondary | ICD-10-CM | POA: Diagnosis not present

## 2021-08-12 DIAGNOSIS — J45998 Other asthma: Secondary | ICD-10-CM | POA: Diagnosis not present

## 2021-08-13 ENCOUNTER — Ambulatory Visit: Payer: Medicare HMO | Admitting: Podiatry

## 2021-08-13 ENCOUNTER — Other Ambulatory Visit (INDEPENDENT_AMBULATORY_CARE_PROVIDER_SITE_OTHER): Payer: Self-pay | Admitting: Vascular Surgery

## 2021-08-13 DIAGNOSIS — I4891 Unspecified atrial fibrillation: Secondary | ICD-10-CM | POA: Diagnosis not present

## 2021-08-13 DIAGNOSIS — J449 Chronic obstructive pulmonary disease, unspecified: Secondary | ICD-10-CM | POA: Diagnosis not present

## 2021-08-13 DIAGNOSIS — L97321 Non-pressure chronic ulcer of left ankle limited to breakdown of skin: Secondary | ICD-10-CM | POA: Diagnosis not present

## 2021-08-13 DIAGNOSIS — I890 Lymphedema, not elsewhere classified: Secondary | ICD-10-CM | POA: Diagnosis not present

## 2021-08-13 DIAGNOSIS — I130 Hypertensive heart and chronic kidney disease with heart failure and stage 1 through stage 4 chronic kidney disease, or unspecified chronic kidney disease: Secondary | ICD-10-CM | POA: Diagnosis not present

## 2021-08-13 DIAGNOSIS — N189 Chronic kidney disease, unspecified: Secondary | ICD-10-CM | POA: Diagnosis not present

## 2021-08-13 DIAGNOSIS — M1030 Gout due to renal impairment, unspecified site: Secondary | ICD-10-CM | POA: Diagnosis not present

## 2021-08-13 DIAGNOSIS — I509 Heart failure, unspecified: Secondary | ICD-10-CM | POA: Diagnosis not present

## 2021-08-13 DIAGNOSIS — I872 Venous insufficiency (chronic) (peripheral): Secondary | ICD-10-CM | POA: Diagnosis not present

## 2021-08-18 ENCOUNTER — Other Ambulatory Visit (INDEPENDENT_AMBULATORY_CARE_PROVIDER_SITE_OTHER): Payer: Self-pay | Admitting: Nurse Practitioner

## 2021-08-18 DIAGNOSIS — I739 Peripheral vascular disease, unspecified: Secondary | ICD-10-CM

## 2021-08-18 NOTE — Progress Notes (Signed)
PROVIDER NOTE: Information contained herein reflects review and annotations entered in association with encounter. Interpretation of such information and data should be left to medically-trained personnel. Information provided to patient can be located elsewhere in the medical record under "Patient Instructions". Document created using STT-dictation technology, any transcriptional errors that may result from process are unintentional.    Patient: Kristin [name]  Service Category: E/M  Provider: Gaspar Cola, MD  DOB: 11/26/1929  DOS: 08/19/2021  Specialty: Interventional Pain Management  MRN: [id number]  Setting: Ambulatory outpatient  PCP: Olin Hauser, DO  Type: Established Patient    Referring Provider: [name] *  Location: Office  Delivery: Face-to-face     HPI  [name], a 87 y.o. year old female, is here today because of her Chronic pain syndrome [G89.4]. Ms. Luba primary complain today is Back Pain (lower) Last encounter: My last encounter with her was on 07/07/2021. Pertinent problems: Ms. 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); Epidural fibrosis; Lumbar foraminal stenosis (Severe Left L4-5); Chronic sacroiliac joint pain (Left); Osteoarthritis involving multiple joints; Chronic neck pain; Chronic hip pain (Bilateral); Chronic cervical radicular pain (3ry area of Pain) (Bilateral) (L>R); Chronic lower  extremity pain (2ry area of Pain) (Bilateral) (L>R); DDD (degenerative disc disease), cervical; Numbness and tingling of upper extremity (Bilateral); Chronic shoulder pain, Radicular (Bilateral) (L>R); Cervical facet syndrome (Bilateral) (L>R); Cervical (3-4 mm) Anterolisthesis of C4 on C5; Cervical foraminal stenosis (Bilateral); Spondylosis without myelopathy or radiculopathy, lumbosacral region; Chronic shoulders pain (Bilateral) (L>R); Osteoarthritis of shoulder (Bilateral); Osteoarthritis of hip (Bilateral); Chronic shoulder pain after shoulder replacement (Bilateral) (L>R); Trigger finger, left middle finger; Cervicalgia; Greater trochanteric bursitis of hip (Left); Other specified dorsopathies, sacral and sacrococcygeal region; Chronic hip pain (Left); Osteoarthritis of hip (Left); Abnormal MRI, lumbar spine (11/03/2019); Chronic sacroiliac pain (Bilateral); Osteoarthritis of sacroiliac joints (Bilateral) ([place]); Chronic midline low back pain without sciatica; and Ischemic leg pain (Bilateral) on their pertinent problem list. Pain Assessment: Severity of Chronic pain is reported as a 6 /10. Location: Back Lower/buttocks bilateral down legs to feet. Onset: More than a month ago. Quality: Aching, Constant (legs swelling). Timing: Constant. Modifying factor(s):  Marland Kitchen Vitals:  height is '5\' 7"'  (1.702 m) and weight is 160 lb (72.6 kg). Her temperature is 97 F (36.1 C) (abnormal). Her blood pressure is 144/79 (abnormal) and her pulse is 61. Her respiration is 16.   Reason for encounter: medication management.   The patient indicates doing well with the current medication regimen. No adverse reactions or side effects reported to the medications.   She seems to be doing much better with the changes that we made on her medication regimen.  However, she is currently having bilateral lower extremity pain associated with her peripheral vascular disease.  According to her granddaughter she is probably going to have  revascularization surgery.  We have provided the granddaughter with a copy of our surgeons handout on how to manage the pain around the surgery.  RTCB: 11/23/2021 Nonopioids transferred 06/04/2020: Requip, MiraLAX, Dulcolax, and melatonin  Pharmacotherapy Assessment  Analgesic: Oxycodone IR 10 mg, 1 tab PO BID (20 mg/day of oxycodone) MME/day: 30 mg/day.   Monitoring: [place] PMP: PDMP reviewed during this encounter.       Pharmacotherapy: No side-effects or adverse reactions reported. Compliance: No problems identified. Effectiveness: Clinically acceptable.  Ignatius Specking, RN  08/19/2021  1:36 PM  Sign when Signing Visit Nursing Pain Medication Assessment:  Safety precautions to be maintained throughout the outpatient stay will include: orient to surroundings, keep bed in low position, maintain call bell within reach at all times, provide assistance with transfer out of bed and ambulation.  Medication Inspection Compliance: Pill count conducted under aseptic conditions, in front of the patient. Neither the pills nor the bottle was removed from the patient's sight at any time. Once count was completed pills were immediately returned to the patient in their original bottle.  Medication: Oxycodone IR Pill/Patch Count:  48 of 150 pills remain Pill/Patch Appearance: Markings consistent with prescribed medication Bottle Appearance: Standard pharmacy container. Clearly labeled. Filled Date: 49 / 6 / 2022 Last Medication intake:  Today    UDS:  Summary  Date Value Ref Range Status  03/04/2021 Note  Final    Comment:    ==================================================================== [employer] [facility] (MW) ==================================================================== Test                             Result       Flag       Units  Drug Present and Declared for Prescription Verification   Oxymorphone                    270          EXPECTED   ng/mg creat   Noroxycodone                    317          EXPECTED   ng/mg creat    Oxymorphone and noroxycodone are expected metabolites of oxycodone.    Sources of oxycodone are scheduled prescription medications.    Oxymorphone is also available as a scheduled prescription medication.  Drug Absent but Declared for Prescription Verification   Oxycodone                      Not Detected UNEXPECTED ng/mg creat    Oxycodone is almost always present in patients taking this drug    consistently.  Absence of oxycodone could be due to lapse of time    since the last dose or unusual pharmacokinetics (rapid metabolism).  ==================================================================== Test                      Result    Flag   Units      Ref Range   Creatinine              30               mg/dL      >=20 ==================================================================== Declared Medications:  The flagging and interpretation on this report are based on the  following declared medications.  Unexpected results may arise from  inaccuracies in the declared medications.   **Note: The testing scope of this panel includes these medications:   Oxycodone   **Note: The testing scope of this panel does not include the  following reported medications:   Acetaminophen  Albuterol  Apixaban (Eliquis)  Aspirin  Atorvastatin  Bisacodyl  Dextromethorphan (Mucinex DM)  Furosemide  Guaifenesin (Mucinex DM)  Helium  Levocetirizine (Xyzal)  Lisinopril  Melatonin  Oxygen  Ropinirole  Solifenacin (Vesicare)  Supplement (Fiber)  Tiotropium  Topical Lidocaine  Umeclidinium (Anoro)  Vilanterol (Anoro) ==================================================================== For clinical consultation, please call [phone]. ====================================================================      ROS  Constitutional: Denies any fever or chills Gastrointestinal: No reported hemesis, hematochezia, vomiting, or acute GI  distress Musculoskeletal: Denies any acute onset joint swelling, redness, loss of ROM, or weakness Neurological: No reported episodes of acute onset apraxia, aphasia, dysarthria, agnosia, amnesia, paralysis, loss of coordination, or loss of consciousness  Medication Review  Budeson-Glycopyrrol-Formoterol, Melatonin, Oxygen-Helium, Vitamin D, acetaminophen, albuterol, apixaban, aspirin, atorvastatin, bisacodyl, furosemide, gabapentin, levocetirizine, oxyCODONE, and rOPINIRole  History Review  Allergy: Ms. Tony is allergic to flexeril [cyclobenzaprine hcl] and vancomycin. Drug: Ms. Florido  reports current drug use. Drug: Oxycodone. Alcohol:  reports no history of alcohol use. Tobacco:  reports that she quit smoking about 22 years ago. Her smoking use included cigarettes. She has a 50.00 pack-year smoking history. She has quit using smokeless tobacco. Social: Ms. Bardon  reports that she quit smoking about 22 years ago. Her smoking use included cigarettes. She has a 50.00 pack-year smoking history. She has quit using smokeless tobacco. She reports current drug use. Drug: Oxycodone. She reports that she does not drink alcohol. Medical:  has a past medical history of Acute postoperative pain (02/09/2018), Arrhythmia, sinus node (04/03/2014), Arthritis, Arthritis, Atrial fibrillation ([place]), Back pain, Bradycardia, Breast cancer ([place]) (2004), Cancer ([place]) (2004), CHF (congestive heart failure) ([place]), Chronic back pain, CKD (chronic kidney disease), COPD (chronic obstructive pulmonary disease) ([place]), Cystocele, Decubitus ulcers, Dehydration, Gout, Hematuria, Hepatitis C, Hyperlipidemia, Hypertension, Hypomagnesemia (06/25/2015), OAB (overactive bladder), Overactive bladder, Restless leg, Shoulder pain, bilateral, Urinary frequency, UTI (lower urinary tract infection), and Vaginal atrophy. Surgical: Ms. Better  has a past surgical history that includes Abdominal hysterectomy; Back surgery; Rotator cuff repair;  Breast surgery; Cataract extraction w/PHACO (10/19/2011); Cataract extraction w/PHACO (11/02/2011); Carpal tunnel release; Joint replacement; Colon surgery; Pacemaker insertion (N/A, 12/24/2015); Cholecystectomy; Breast lumpectomy (Right, 2004); Insert / replace / remove pacemaker; and Lower Extremity Angiography (Left, 10/14/2020). Family: family history includes Breast cancer in her paternal aunt; Breast cancer (age of onset: 43) in her sister; Cancer in her sister; Heart disease in her father and mother; Kidney disease in her brother; Stroke in her mother.  Laboratory Chemistry Profile   Renal Lab Results  Component Value Date   BUN 35 (H) 07/18/2021   CREATININE 1.44 (H) 07/18/2021   BCR 40 (H) 04/02/2021   GFRAA 52 (L) 10/02/2020   GFRNONAA 35 (L) 07/18/2021    Hepatic Lab Results  Component Value Date   AST 21 11/07/2020   ALT 16 11/07/2020   ALBUMIN 3.7 11/07/2020   ALKPHOS 55 11/07/2020    Electrolytes Lab Results  Component Value Date   NA 138 07/18/2021   K 4.1 07/18/2021   CL 110 07/18/2021   CALCIUM 8.6 (L) 07/18/2021   MG 2.0 11/07/2020   PHOS 3.0 08/05/2020    Bone Lab Results  Component Value Date   VD25OH 31 04/01/2020    Inflammation (CRP: Acute Phase) (ESR: Chronic Phase) Lab Results  Component Value Date   CRP <0.5 09/24/2015   ESRSEDRATE 12 09/24/2015   LATICACIDVEN 1.8 08/02/2020         Note: Above Lab results reviewed.  Recent Imaging Review  DG Chest 2 View CLINICAL  DATA:  Shortness of breath.  EXAM: CHEST - 2 VIEW  COMPARISON:  Chest radiograph 04/07/2021; CT chest 01/08/2021  FINDINGS: Diffuse emphysematous changes with reticulation at the lung bases. New right perihilar and bibasilar interstitial thickening. No pleural effusion or pneumothorax. Cardiomediastinal silhouette is stable. Aortic calcifications. Left chest wall pacemaker with leads overlying the right atrium and right ventricle. Bilateral total shoulder arthroplasties.  Surgical clips overlie the right axilla and right upper abdomen.  IMPRESSION: 1. Interval development of right perihilar and bibasilar interstitial thickening which could reflect pulmonary edema versus infection in the appropriate clinical setting. 2. Aortic Atherosclerosis (ICD10-I70.0) and Emphysema (ICD10-J43.9).  Electronically Signed   By: Ileana Roup M.D.   On: 07/18/2021 15:23 Note: Reviewed        Physical Exam  General appearance: Well nourished, well developed, and well hydrated. In no apparent acute distress Mental status: Alert, oriented x 3 (person, place, & time)       Respiratory: No evidence of acute respiratory distress Eyes: PERLA Vitals: BP (!) 144/79    Pulse 61    Temp (!) 97 F (36.1 C)    Resp 16    Ht '5\' 7"'  (1.702 m)    Wt 160 lb (72.6 kg)    BMI 25.06 kg/m  BMI: Estimated body mass index is 25.06 kg/m as calculated from the following:   Height as of this encounter: '5\' 7"'  (1.702 m).   Weight as of this encounter: 160 lb (72.6 kg). Ideal: Ideal body weight: 61.6 kg (135 lb 12.9 oz) Adjusted ideal body weight: 66 kg (145 lb 7.7 oz)  Assessment   Status Diagnosis  Controlled Controlled Controlled 1. Chronic pain syndrome   2. 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 Chronic low back pain (1ry area of Pain) (Bilateral) (L>R) w/ sciatica (Bilateral)   4. Lumbar facet syndrome (Bilateral) (L>R)   5. Chronic sacroiliac pain (Bilateral)   6. Chronic lower extremity pain (2ry area of Pain) (Bilateral) (L>R)   7. Ischemic leg pain (Bilateral)   8. Chronic hip pain (Bilateral)   9. Chronic neck pain   10. Cervical facet syndrome (Bilateral) (L>R)   11. Chronic cervical radicular pain (3ry area of Pain) (Bilateral) (L>R)   12. Chronic shoulders pain (Bilateral) (L>R)   13. Pharmacologic therapy   14. Chronic use of opiate for therapeutic purpose   15. Encounter for medication management      Updated Problems: Problem   Ischemic leg pain (Bilateral)    Plan of Care  Problem-specific:  No problem-specific Assessment & Plan notes found for this encounter.  Ms. MIKAILI FLIPPIN has a current medication list which includes the following long-term medication(s): albuterol, apixaban, atorvastatin, bisacodyl, furosemide, gabapentin, levocetirizine, [START ON 08/25/2021] oxycodone, [START ON 09/24/2021] oxycodone, [START ON 10/24/2021] oxycodone, and ropinirole.  Pharmacotherapy (Medications Ordered): Meds ordered this encounter  Medications   oxyCODONE (OXY IR/ROXICODONE) 5 MG immediate release tablet    Sig: Take 2 tablets (10 mg total) by mouth 2 (two) times daily. May also take 1 tablet (5 mg total) at bedtime as needed for severe pain. Must last 30 days..    Dispense:  150 tablet    Refill:  0    DO NOT: delete (not duplicate); no partial-fill (will deny script to complete), no refill request (F/U required). DISPENSE: 1 day early if closed on fill date. WARN: No CNS-depressants within 8 hrs of med.   oxyCODONE (OXY IR/ROXICODONE) 5 MG immediate release tablet  Sig: Take 2 tablets (10 mg total) by mouth 2 (two) times daily. May also take 1 tablet (5 mg total) at bedtime as needed for severe pain. Must last 30 days..    Dispense:  150 tablet    Refill:  0    DO NOT: delete (not duplicate); no partial-fill (will deny script to complete), no refill request (F/U required). DISPENSE: 1 day early if closed on fill date. WARN: No CNS-depressants within 8 hrs of med.   oxyCODONE (OXY IR/ROXICODONE) 5 MG immediate release tablet    Sig: Take 2 tablets (10 mg total) by mouth 2 (two) times daily. May also take 1 tablet (5 mg total) at bedtime as needed for severe pain. Must last 30 days..    Dispense:  150 tablet    Refill:  0    DO NOT: delete (not duplicate); no partial-fill (will deny script to complete), no refill request (F/U required). DISPENSE: 1 day early if closed on fill date. WARN: No CNS-depressants within 8 hrs  of med.   Orders:  No orders of the defined types were placed in this encounter.  Follow-up plan:   Return in about 3 months (around 11/23/2021) for Eval-day (M,W), (F2F), (MM).     Interventional Therapies  Risk   Complexity Considerations:   Estimated body mass index is 25.06 kg/m as calculated from the following:   Height as of this encounter: '5\' 7"'  (1.702 m).   Weight as of this encounter: 160 lb (72.6 kg). NOTE: ELIQUIS ANTICOAGULATION (Stop: x3 days prior to procedure   Restart 6 hours after procedures)   Planned   Pending:      Under consideration:   Diagnostic right SI joint block  Diagnostic right IA shoulder joint injection    Completed:   Diagnostic/therapeutic right L2 TFESI x1 (01/31/2020)  Diagnostic/therapeutic left L2 TFESI x1 (06/09/2021)  Diagnostic/therapeutic left L4 TFESI x1 (01/31/2020) Diagnostic left SI joint block x2 (09/27/2019) Palliative left CESI x2 (10/13/2017) Palliative left trochanteric bursa injection x1 (03/27/2019)  Palliative right IA hip inj. x1 (100% on the right) (02/08/2019) Palliative left IA hip inj. x2 (02/08/2019) Palliative right lumbar facet MBB x4 (03/24/2021) Palliative left lumbar facet MBB x6 (03/24/2021) Palliative right lumbar facet RFA x1 (09/19/18) Palliative left lumbar facet RFA x3 (08/01/18) Diagnostic/therapeutic right suprascapular NB x3 (05/10/2017)  Palliative bilateral suprascapular nerve RFA x1 (07/13/18)   Therapeutic   Palliative (PRN) options:   Diagnostic SI joint Blk   Palliative CESI   Palliative trochanteric bursa injection   Palliative IA hip inj.  Palliative lumbar facet MBB  Palliative lumbar facet RFA  Palliative suprascapular NB   Palliative suprascapular nerve RFA     Recent Visits Date Type Provider Dept  07/07/21 Office Visit Milinda Pointer, MD [facility]  06/09/21 Procedure visit Milinda Pointer, MD [facility]  05/27/21 Office Visit Milinda Pointer, MD [facility]  Showing recent visits within past 90 days and meeting all other requirements Today's Visits Date Type Provider Dept  08/19/21 Office Visit Milinda Pointer, MD [facility]  Showing today's visits and meeting all other requirements Future Appointments No visits were found meeting these conditions. Showing future appointments within next 90 days and meeting all other requirements  I discussed the assessment and treatment plan with the patient. The patient was provided an opportunity to ask questions and all were answered. The patient agreed with the plan and demonstrated an understanding of the instructions.  Patient advised to call back  or seek an in-person evaluation if the symptoms or condition worsens.  Duration of encounter: 30 minutes.  Note by: Gaspar Cola, MD Date: 08/19/2021; Time: 1:53 PM

## 2021-08-19 ENCOUNTER — Ambulatory Visit (INDEPENDENT_AMBULATORY_CARE_PROVIDER_SITE_OTHER): Payer: Medicare HMO | Admitting: Nurse Practitioner

## 2021-08-19 ENCOUNTER — Encounter: Payer: Self-pay | Admitting: Pain Medicine

## 2021-08-19 ENCOUNTER — Other Ambulatory Visit: Payer: Self-pay

## 2021-08-19 ENCOUNTER — Ambulatory Visit (INDEPENDENT_AMBULATORY_CARE_PROVIDER_SITE_OTHER): Payer: Medicare HMO

## 2021-08-19 ENCOUNTER — Ambulatory Visit: Payer: Medicare HMO | Attending: Pain Medicine | Admitting: Pain Medicine

## 2021-08-19 VITALS — BP 134/81 | HR 90 | Ht 67.0 in | Wt 160.0 lb

## 2021-08-19 VITALS — BP 144/79 | HR 61 | Temp 97.0°F | Resp 16 | Ht 67.0 in | Wt 160.0 lb

## 2021-08-19 DIAGNOSIS — M5412 Radiculopathy, cervical region: Secondary | ICD-10-CM | POA: Insufficient documentation

## 2021-08-19 DIAGNOSIS — E785 Hyperlipidemia, unspecified: Secondary | ICD-10-CM | POA: Diagnosis not present

## 2021-08-19 DIAGNOSIS — M5442 Lumbago with sciatica, left side: Secondary | ICD-10-CM | POA: Diagnosis not present

## 2021-08-19 DIAGNOSIS — M79606 Pain in leg, unspecified: Secondary | ICD-10-CM | POA: Insufficient documentation

## 2021-08-19 DIAGNOSIS — I890 Lymphedema, not elsewhere classified: Secondary | ICD-10-CM

## 2021-08-19 DIAGNOSIS — Z9889 Other specified postprocedural states: Secondary | ICD-10-CM

## 2021-08-19 DIAGNOSIS — M47816 Spondylosis without myelopathy or radiculopathy, lumbar region: Secondary | ICD-10-CM | POA: Insufficient documentation

## 2021-08-19 DIAGNOSIS — I739 Peripheral vascular disease, unspecified: Secondary | ICD-10-CM

## 2021-08-19 DIAGNOSIS — M961 Postlaminectomy syndrome, not elsewhere classified: Secondary | ICD-10-CM | POA: Diagnosis not present

## 2021-08-19 DIAGNOSIS — M5441 Lumbago with sciatica, right side: Secondary | ICD-10-CM | POA: Insufficient documentation

## 2021-08-19 DIAGNOSIS — M25559 Pain in unspecified hip: Secondary | ICD-10-CM | POA: Insufficient documentation

## 2021-08-19 DIAGNOSIS — G8929 Other chronic pain: Secondary | ICD-10-CM | POA: Insufficient documentation

## 2021-08-19 DIAGNOSIS — M47812 Spondylosis without myelopathy or radiculopathy, cervical region: Secondary | ICD-10-CM | POA: Diagnosis present

## 2021-08-19 DIAGNOSIS — Z79899 Other long term (current) drug therapy: Secondary | ICD-10-CM | POA: Diagnosis present

## 2021-08-19 DIAGNOSIS — M25512 Pain in left shoulder: Secondary | ICD-10-CM | POA: Diagnosis present

## 2021-08-19 DIAGNOSIS — I998 Other disorder of circulatory system: Secondary | ICD-10-CM | POA: Diagnosis present

## 2021-08-19 DIAGNOSIS — M79605 Pain in left leg: Secondary | ICD-10-CM | POA: Insufficient documentation

## 2021-08-19 DIAGNOSIS — I7025 Atherosclerosis of native arteries of other extremities with ulceration: Secondary | ICD-10-CM | POA: Diagnosis not present

## 2021-08-19 DIAGNOSIS — M25511 Pain in right shoulder: Secondary | ICD-10-CM | POA: Insufficient documentation

## 2021-08-19 DIAGNOSIS — M533 Sacrococcygeal disorders, not elsewhere classified: Secondary | ICD-10-CM | POA: Insufficient documentation

## 2021-08-19 DIAGNOSIS — M542 Cervicalgia: Secondary | ICD-10-CM | POA: Insufficient documentation

## 2021-08-19 DIAGNOSIS — M79604 Pain in right leg: Secondary | ICD-10-CM | POA: Insufficient documentation

## 2021-08-19 DIAGNOSIS — Z79891 Long term (current) use of opiate analgesic: Secondary | ICD-10-CM | POA: Diagnosis present

## 2021-08-19 DIAGNOSIS — G894 Chronic pain syndrome: Secondary | ICD-10-CM | POA: Insufficient documentation

## 2021-08-19 MED ORDER — OXYCODONE HCL 5 MG PO TABS: ORAL_TABLET | ORAL | 0 refills | Status: DC

## 2021-08-19 NOTE — Progress Notes (Signed)
Nursing Pain Medication Assessment:  Safety precautions to be maintained throughout the outpatient stay will include: orient to surroundings, keep bed in low position, maintain call bell within reach at all times, provide assistance with transfer out of bed and ambulation.  Medication Inspection Compliance: Pill count conducted under aseptic conditions, in front of the patient. Neither the pills nor the bottle was removed from the patient's sight at any time. Once count was completed pills were immediately returned to the patient in their original bottle.  Medication: Oxycodone IR Pill/Patch Count:  48 of 150 pills remain Pill/Patch Appearance: Markings consistent with prescribed medication Bottle Appearance: Standard pharmacy container. Clearly labeled. Filled Date: 36 / 6 / 2022 Last Medication intake:  Today

## 2021-08-21 ENCOUNTER — Telehealth (INDEPENDENT_AMBULATORY_CARE_PROVIDER_SITE_OTHER): Payer: Self-pay

## 2021-08-21 NOTE — Telephone Encounter (Signed)
I spoke with [name] and made her aware of the NP's instructions

## 2021-08-21 NOTE — Telephone Encounter (Signed)
[name] from [facility] called an wanted to know is the pt going to be doing BIL unna boots from now on,and what is the frequency she needs to  have home care come out an change them. Please advise.

## 2021-08-21 NOTE — Telephone Encounter (Signed)
Yes Bil unna changed weekly

## 2021-08-26 ENCOUNTER — Encounter (INDEPENDENT_AMBULATORY_CARE_PROVIDER_SITE_OTHER): Payer: Self-pay | Admitting: Nurse Practitioner

## 2021-08-26 ENCOUNTER — Encounter (INDEPENDENT_AMBULATORY_CARE_PROVIDER_SITE_OTHER): Payer: Self-pay

## 2021-08-26 NOTE — Progress Notes (Addendum)
Subjective:    [name], female    DOB: 1930/02/23, 88 y.o.   MRN: [id number] Chief Complaint  Patient presents with   Follow-up    U/S F/U    Kristin [name] is an 88 year old female that presents today for follow-up patient is her lower extremity ulceration following 4 weeks of Unna wraps.  Ulceration does appear improved.  The area with heavy scabbing has cleared over and it shows more of an area of healed skin.  T she does note some pain in her right foot.  There is a very small area of ulceration on her right great toe.  The pain tends to be worse at night.   The patient has an ABI of 0.84 on the right and 1.11 on the left.  The TBI on the right is 0.55 on the left 0.81. Noninvasive studies today show triphasic waveforms in the common femoral artery deep femoral artery bilaterally with monophasic waveforms beginning sharply at the proximal SFA extending down to the tibial levels.   Review of Systems  Cardiovascular:  Positive for leg swelling.  Skin:  Positive for wound.  All other systems reviewed and are negative.     Objective:   Physical Exam Vitals reviewed.  HENT:     Head: Normocephalic.  Cardiovascular:     Rate and Rhythm: Normal rate.     Comments: Nonpalpable pulses Pulmonary:     Effort: Pulmonary effort is normal.  Musculoskeletal:       Feet:  Feet:     Right foot:     Skin integrity: Ulcer present.  Neurological:     Mental Status: She is alert and oriented to person, place, and time.     Motor: Weakness present.  Psychiatric:        Mood and Affect: Mood normal.        Behavior: Behavior normal.        Thought Content: Thought content normal.        Judgment: Judgment normal.    BP 134/81    Pulse 90    Ht 5\' 7"  (1.702 m)    Wt 160 lb (72.6 kg)    BMI 25.06 kg/m   Past Medical History:  Diagnosis Date   Acute postoperative pain 02/09/2018   Arrhythmia, sinus node 04/03/2014   Arthritis    Arthritis    Atrial fibrillation (HCC)     Back pain    lower back chronic   Bradycardia    Breast cancer ([place]) 2004   right breast cancer   Cancer ([place]) 2004   rt breast cancer-post lumpectomy- chemo/rad   CHF (congestive heart failure) (HCC)    Chronic back pain    CKD (chronic kidney disease)    COPD (chronic obstructive pulmonary disease) ([place])    wears O2 at 2L via [place] at night   Cystocele    Decubitus ulcers    Dehydration    Gout    Hematuria    Hepatitis C    Hyperlipidemia    Hypertension    Hypomagnesemia 06/25/2015   OAB (overactive bladder)    Overactive bladder    Restless leg    Shoulder pain, bilateral    Urinary frequency    UTI (lower urinary tract infection)    Vaginal atrophy     Social History   Socioeconomic History   Marital status: Single    Spouse name: Not on file   Number of children: 2  Years of education: 108   Highest education level: 12th grade  Occupational History   Occupation: retired  Tobacco Use   Smoking status: Former    Packs/day: 1.25    Years: 40.00    Pack years: 50.00    Types: Cigarettes    Quit date: 12/16/1998    Years since quitting: 22.7   Smokeless tobacco: Former  Scientific laboratory technician Use: Never used  Substance and Sexual Activity   Alcohol use: No    Alcohol/week: 0.0 standard drinks   Drug use: Yes    Types: Oxycodone   Sexual activity: Not Currently    Birth control/protection: Surgical  Other Topics Concern   Not on file  Social History Narrative   Not on file   Social Determinants of Health   Financial Resource Strain: Not on file  Food Insecurity: No Food Insecurity   Worried About Charity fundraiser in the Last Year: Never true   [place] in the Last Year: Never true  Transportation Needs: No Transportation Needs   Lack of Transportation (Medical): No   Lack of Transportation (Non-Medical): No  Physical Activity: Not on file  Stress: Not on file  Social Connections: Not on file  Intimate Partner Violence: Not on file     Past Surgical History:  Procedure Laterality Date   ABDOMINAL HYSTERECTOMY     BACK SURGERY     BREAST LUMPECTOMY Right 2004   BREAST SURGERY     rt lumpectomy   CARPAL TUNNEL RELEASE     CATARACT EXTRACTION W/PHACO  10/19/2011   Procedure: CATARACT EXTRACTION PHACO AND INTRAOCULAR LENS PLACEMENT (IOC);  Surgeon: Elta Guadeloupe T. Gershon Crane, MD;  Location: [facility];  Service: Ophthalmology;  Laterality: Right;  CDE:10.81   CATARACT EXTRACTION W/PHACO  11/02/2011   Procedure: CATARACT EXTRACTION PHACO AND INTRAOCULAR LENS PLACEMENT (IOC);  Surgeon: Elta Guadeloupe T. Gershon Crane, MD;  Location: [facility];  Service: Ophthalmology;  Laterality: Left;  CDE 8.60   CHOLECYSTECTOMY     3/18   COLON SURGERY     INSERT / REPLACE / REMOVE PACEMAKER     JOINT REPLACEMENT     bilateral TKA   LOWER EXTREMITY ANGIOGRAPHY Left 10/14/2020   Procedure: LOWER EXTREMITY ANGIOGRAPHY;  Surgeon: Katha Cabal, MD;  Location: [facility];  Service: Cardiovascular;  Laterality: Left;   PACEMAKER INSERTION N/A 12/24/2015   Procedure: INSERTION PACEMAKER;  Surgeon: Isaias Cowman, MD;  Location: [facility];  Service: Cardiovascular;  Laterality: N/A;   ROTATOR CUFF REPAIR     bilateral    Family History  Problem Relation Age of Onset   Kidney disease Brother    Heart disease Father    Heart disease Mother    Stroke Mother    Cancer Sister    Breast cancer Sister 91   Breast cancer Paternal Aunt    Anesthesia problems Neg Hx    Hypotension Neg Hx    Malignant hyperthermia Neg Hx    Pseudochol deficiency Neg Hx     Allergies  Allergen Reactions   Flexeril [Cyclobenzaprine Hcl]     Severe Rash   Vancomycin Rash    [place] Syndrome    CBC Latest Ref Rng & Units 07/18/2021 04/08/2021 04/07/2021  WBC 4.0 - 10.5 K/uL 7.5 9.5 9.6  Hemoglobin 12.0 - 15.0 g/dL 12.0 12.8 12.6  Hematocrit 36.0 - 46.0 % 38.5 40.2 39.3  Platelets 150 - 400 K/uL 208 138(L) 152  CMP     Component Value Date/Time   NA 138  07/18/2021 1440   NA 138 09/06/2014 2215   K 4.1 07/18/2021 1440   K 4.4 09/06/2014 2215   CL 110 07/18/2021 1440   CL 106 09/06/2014 2215   CO2 23 07/18/2021 1440   CO2 23 09/06/2014 2215   GLUCOSE 99 07/18/2021 1440   GLUCOSE 92 09/06/2014 2215   BUN 35 (H) 07/18/2021 1440   BUN 25 (H) 09/06/2014 2215   CREATININE 1.44 (H) 07/18/2021 1440   CREATININE 1.40 (H) 04/02/2021 0916   CALCIUM 8.6 (L) 07/18/2021 1440   CALCIUM 9.0 09/06/2014 2215   PROT 7.0 11/07/2020 2002   PROT 7.1 07/10/2014 1219   ALBUMIN 3.7 11/07/2020 2002   ALBUMIN 3.2 (L) 07/10/2014 1219   AST 21 11/07/2020 2002   AST 26 07/10/2014 1219   ALT 16 11/07/2020 2002   ALT 15 07/10/2014 1219   ALKPHOS 55 11/07/2020 2002   ALKPHOS 69 07/10/2014 1219   BILITOT 0.6 11/07/2020 2002   BILITOT 0.4 07/10/2014 1219   GFRNONAA 35 (L) 07/18/2021 1440   GFRNONAA 45 (L) 10/02/2020 1106   GFRAA 52 (L) 10/02/2020 1106     No results found.     Assessment & Plan:   1. Atherosclerosis of native arteries of the extremities with ulceration ([place]) Recommend:  The patient has evidence of severe atherosclerotic changes of both lower extremities with rest pain that is associated with preulcerative changes and impending tissue loss of the foot.  This represents a limb threatening ischemia and places the patient at the risk for limb loss.  Patient should undergo angiography of the lower extremities with the hope for intervention for limb salvage.  The risks and benefits as well as the alternative therapies was discussed in detail with the patient.  All questions were answered.  Patient agrees to proceed with angiography.  The patient will follow up with me in the office after the procedure.       2. Hyperlipidemia, unspecified hyperlipidemia type Continue statin as ordered and reviewed, no changes at this time   3. Lymphedema Today after 4 weeks of Unna wraps the wound is much improved however there is concern that the wound  is fairly fresh it may recur without adequate compression.  In we will continue to have the patient placed in [place] wraps to see if there is continued improvement of the wound.       Current Outpatient Medications on File Prior to Visit  Medication Sig Dispense Refill   acetaminophen (TYLENOL) 500 MG tablet Take 500 mg by mouth in the morning and at bedtime.     albuterol (PROVENTIL) (2.5 MG/3ML) 0.083% nebulizer solution USE 1 VIAL IN NEBULIZER EVERY 8 HOURS AS NEEDED FOR WHEEZING OR SHORTNESS OF BREATH. 75 mL 3   apixaban (ELIQUIS) 2.5 MG TABS tablet Take 1 tablet (2.5 mg total) by mouth 2 (two) times daily. 60 tablet 0   atorvastatin (LIPITOR) 20 MG tablet TAKE 1 TABLET BY MOUTH ONCE DAILY FOR CHOLESTEROL 30 tablet 11   bisacodyl (DULCOLAX) 5 MG EC tablet Take 2 tablets (10 mg total) by mouth daily as needed for moderate constipation. 180 tablet 1   BREZTRI AEROSPHERE 160-9-4.8 MCG/ACT AERO Inhale 2 puffs into the lungs 2 (two) times daily. 10.7 g 5   Cholecalciferol (VITAMIN D) 2000 units tablet Take 2,000 Units by mouth daily.      furosemide (LASIX) 20 MG tablet Take 0.5-1 tablets (10-20 mg  total) by mouth daily as needed for edema or fluid. 30 tablet 2   gabapentin (NEURONTIN) 100 MG capsule TAKE 3 CAPSULES BY MOUTH AT BEDTIME 270 capsule 0   GNP ASPIRIN LOW DOSE 81 MG EC tablet TAKE 1 TABLET BY MOUTH ONCE DAILY. *SWALLOW WHOLE* 150 tablet 11   levocetirizine (XYZAL) 5 MG tablet TAKE (1/2) TABLET BY MOUTH EVERY OTHER DAY. 8 tablet 9   Melatonin 10 MG TABS Take 10 mg by mouth at bedtime.     OXYGEN Inhale 2 L into the lungs at bedtime.      rOPINIRole (REQUIP) 2 MG tablet TAKE 2 TABLETS BY MOUTH AT BEDTIME 180 tablet 1   oxyCODONE (OXY IR/ROXICODONE) 5 MG immediate release tablet Take 2 tablets (10 mg total) by mouth 2 (two) times daily. May also take 1 tablet (5 mg total) at bedtime as needed for severe pain. Must last 30 days.. 150 tablet 0   [START ON 09/24/2021] oxyCODONE (OXY  IR/ROXICODONE) 5 MG immediate release tablet Take 2 tablets (10 mg total) by mouth 2 (two) times daily. May also take 1 tablet (5 mg total) at bedtime as needed for severe pain. Must last 30 days.. 150 tablet 0   [START ON 10/24/2021] oxyCODONE (OXY IR/ROXICODONE) 5 MG immediate release tablet Take 2 tablets (10 mg total) by mouth 2 (two) times daily. May also take 1 tablet (5 mg total) at bedtime as needed for severe pain. Must last 30 days.. 150 tablet 0   No current facility-administered medications on file prior to visit.    There are no Patient Instructions on file for this visit. No follow-ups on file.   Kris Hartmann, NP

## 2021-08-26 NOTE — H&P (View-Only) (Signed)
Subjective:    [name], female    DOB: 1930/03/22, 88 y.o.   MRN: [id number] Chief Complaint  Patient presents with   Follow-up    U/S F/U    [name] is an 88 year old female that presents today for follow-up patient is her lower extremity ulceration following 4 weeks of Unna wraps.  Ulceration does appear improved.  The area with heavy scabbing has cleared over and it shows more of an area of healed skin.  T she does note some pain in her right foot.  There is a very small area of ulceration on her right great toe.  The pain tends to be worse at night.   The patient has an ABI of 0.84 on the right and 1.11 on the left.  The TBI on the right is 0.55 on the left 0.81. Noninvasive studies today show triphasic waveforms in the common femoral artery deep femoral artery bilaterally with monophasic waveforms beginning sharply at the proximal SFA extending down to the tibial levels.   Review of Systems  Cardiovascular:  Positive for leg swelling.  Skin:  Positive for wound.  All other systems reviewed and are negative.     Objective:   Physical Exam Vitals reviewed.  HENT:     Head: Normocephalic.  Cardiovascular:     Rate and Rhythm: Normal rate.     Comments: Nonpalpable pulses Pulmonary:     Effort: Pulmonary effort is normal.  Musculoskeletal:       Feet:  Feet:     Right foot:     Skin integrity: Ulcer present.  Neurological:     Mental Status: She is alert and oriented to person, place, and time.     Motor: Weakness present.  Psychiatric:        Mood and Affect: Mood normal.        Behavior: Behavior normal.        Thought Content: Thought content normal.        Judgment: Judgment normal.    BP 134/81    Pulse 90    Ht 5\' 7"  (1.702 m)    Wt 160 lb (72.6 kg)    BMI 25.06 kg/m   Past Medical History:  Diagnosis Date   Acute postoperative pain 02/09/2018   Arrhythmia, sinus node 04/03/2014   Arthritis    Arthritis    Atrial fibrillation (HCC)     Back pain    lower back chronic   Bradycardia    Breast cancer ([place]) 2004   right breast cancer   Cancer ([place]) 2004   rt breast cancer-post lumpectomy- chemo/rad   CHF (congestive heart failure) (HCC)    Chronic back pain    CKD (chronic kidney disease)    COPD (chronic obstructive pulmonary disease) ([place])    wears O2 at 2L via [place] at night   Cystocele    Decubitus ulcers    Dehydration    Gout    Hematuria    Hepatitis C    Hyperlipidemia    Hypertension    Hypomagnesemia 06/25/2015   OAB (overactive bladder)    Overactive bladder    Restless leg    Shoulder pain, bilateral    Urinary frequency    UTI (lower urinary tract infection)    Vaginal atrophy     Social History   Socioeconomic History   Marital status: Single    Spouse name: Not on file   Number of children: 2  Years of education: 15   Highest education level: 12th grade  Occupational History   Occupation: retired  Tobacco Use   Smoking status: Former    Packs/day: 1.25    Years: 40.00    Pack years: 50.00    Types: Cigarettes    Quit date: 12/16/1998    Years since quitting: 22.7   Smokeless tobacco: Former  Scientific laboratory technician Use: Never used  Substance and Sexual Activity   Alcohol use: No    Alcohol/week: 0.0 standard drinks   Drug use: Yes    Types: Oxycodone   Sexual activity: Not Currently    Birth control/protection: Surgical  Other Topics Concern   Not on file  Social History Narrative   Not on file   Social Determinants of Health   Financial Resource Strain: Not on file  Food Insecurity: No Food Insecurity   Worried About Charity fundraiser in the Last Year: Never true   [place] in the Last Year: Never true  Transportation Needs: No Transportation Needs   Lack of Transportation (Medical): No   Lack of Transportation (Non-Medical): No  Physical Activity: Not on file  Stress: Not on file  Social Connections: Not on file  Intimate Partner Violence: Not on file     Past Surgical History:  Procedure Laterality Date   ABDOMINAL HYSTERECTOMY     BACK SURGERY     BREAST LUMPECTOMY Right 2004   BREAST SURGERY     rt lumpectomy   CARPAL TUNNEL RELEASE     CATARACT EXTRACTION W/PHACO  10/19/2011   Procedure: CATARACT EXTRACTION PHACO AND INTRAOCULAR LENS PLACEMENT (IOC);  Surgeon: Elta Guadeloupe T. Gershon Crane, MD;  Location: [facility];  Service: Ophthalmology;  Laterality: Right;  CDE:10.81   CATARACT EXTRACTION W/PHACO  11/02/2011   Procedure: CATARACT EXTRACTION PHACO AND INTRAOCULAR LENS PLACEMENT (IOC);  Surgeon: Elta Guadeloupe T. Gershon Crane, MD;  Location: [facility];  Service: Ophthalmology;  Laterality: Left;  CDE 8.60   CHOLECYSTECTOMY     3/18   COLON SURGERY     INSERT / REPLACE / REMOVE PACEMAKER     JOINT REPLACEMENT     bilateral TKA   LOWER EXTREMITY ANGIOGRAPHY Left 10/14/2020   Procedure: LOWER EXTREMITY ANGIOGRAPHY;  Surgeon: Katha Cabal, MD;  Location: [facility];  Service: Cardiovascular;  Laterality: Left;   PACEMAKER INSERTION N/A 12/24/2015   Procedure: INSERTION PACEMAKER;  Surgeon: Isaias Cowman, MD;  Location: [facility];  Service: Cardiovascular;  Laterality: N/A;   ROTATOR CUFF REPAIR     bilateral    Family History  Problem Relation Age of Onset   Kidney disease Brother    Heart disease Father    Heart disease Mother    Stroke Mother    Cancer Sister    Breast cancer Sister 63   Breast cancer Paternal Aunt    Anesthesia problems Neg Hx    Hypotension Neg Hx    Malignant hyperthermia Neg Hx    Pseudochol deficiency Neg Hx     Allergies  Allergen Reactions   Flexeril [Cyclobenzaprine Hcl]     Severe Rash   Vancomycin Rash    [place] Syndrome    CBC Latest Ref Rng & Units 07/18/2021 04/08/2021 04/07/2021  WBC 4.0 - 10.5 K/uL 7.5 9.5 9.6  Hemoglobin 12.0 - 15.0 g/dL 12.0 12.8 12.6  Hematocrit 36.0 - 46.0 % 38.5 40.2 39.3  Platelets 150 - 400 K/uL 208 138(L) 152  CMP     Component Value Date/Time   NA 138  07/18/2021 1440   NA 138 09/06/2014 2215   K 4.1 07/18/2021 1440   K 4.4 09/06/2014 2215   CL 110 07/18/2021 1440   CL 106 09/06/2014 2215   CO2 23 07/18/2021 1440   CO2 23 09/06/2014 2215   GLUCOSE 99 07/18/2021 1440   GLUCOSE 92 09/06/2014 2215   BUN 35 (H) 07/18/2021 1440   BUN 25 (H) 09/06/2014 2215   CREATININE 1.44 (H) 07/18/2021 1440   CREATININE 1.40 (H) 04/02/2021 0916   CALCIUM 8.6 (L) 07/18/2021 1440   CALCIUM 9.0 09/06/2014 2215   PROT 7.0 11/07/2020 2002   PROT 7.1 07/10/2014 1219   ALBUMIN 3.7 11/07/2020 2002   ALBUMIN 3.2 (L) 07/10/2014 1219   AST 21 11/07/2020 2002   AST 26 07/10/2014 1219   ALT 16 11/07/2020 2002   ALT 15 07/10/2014 1219   ALKPHOS 55 11/07/2020 2002   ALKPHOS 69 07/10/2014 1219   BILITOT 0.6 11/07/2020 2002   BILITOT 0.4 07/10/2014 1219   GFRNONAA 35 (L) 07/18/2021 1440   GFRNONAA 45 (L) 10/02/2020 1106   GFRAA 52 (L) 10/02/2020 1106     No results found.     Assessment & Plan:   1. Atherosclerosis of native arteries of the extremities with ulceration ([place]) Recommend:  The patient has evidence of severe atherosclerotic changes of both lower extremities with rest pain that is associated with preulcerative changes and impending tissue loss of the foot.  This represents a limb threatening ischemia and places the patient at the risk for limb loss.  Patient should undergo angiography of the lower extremities with the hope for intervention for limb salvage.  The risks and benefits as well as the alternative therapies was discussed in detail with the patient.  All questions were answered.  Patient agrees to proceed with angiography.  The patient will follow up with me in the office after the procedure.       2. Hyperlipidemia, unspecified hyperlipidemia type Continue statin as ordered and reviewed, no changes at this time   3. Lymphedema Today after 4 weeks of Unna wraps the wound is much improved however there is concern that the wound  is fairly fresh it may recur without adequate compression.  In we will continue to have the patient placed in [place] wraps to see if there is continued improvement of the wound.       Current Outpatient Medications on File Prior to Visit  Medication Sig Dispense Refill   acetaminophen (TYLENOL) 500 MG tablet Take 500 mg by mouth in the morning and at bedtime.     albuterol (PROVENTIL) (2.5 MG/3ML) 0.083% nebulizer solution USE 1 VIAL IN NEBULIZER EVERY 8 HOURS AS NEEDED FOR WHEEZING OR SHORTNESS OF BREATH. 75 mL 3   apixaban (ELIQUIS) 2.5 MG TABS tablet Take 1 tablet (2.5 mg total) by mouth 2 (two) times daily. 60 tablet 0   atorvastatin (LIPITOR) 20 MG tablet TAKE 1 TABLET BY MOUTH ONCE DAILY FOR CHOLESTEROL 30 tablet 11   bisacodyl (DULCOLAX) 5 MG EC tablet Take 2 tablets (10 mg total) by mouth daily as needed for moderate constipation. 180 tablet 1   BREZTRI AEROSPHERE 160-9-4.8 MCG/ACT AERO Inhale 2 puffs into the lungs 2 (two) times daily. 10.7 g 5   Cholecalciferol (VITAMIN D) 2000 units tablet Take 2,000 Units by mouth daily.      furosemide (LASIX) 20 MG tablet Take 0.5-1 tablets (10-20 mg  total) by mouth daily as needed for edema or fluid. 30 tablet 2   gabapentin (NEURONTIN) 100 MG capsule TAKE 3 CAPSULES BY MOUTH AT BEDTIME 270 capsule 0   GNP ASPIRIN LOW DOSE 81 MG EC tablet TAKE 1 TABLET BY MOUTH ONCE DAILY. *SWALLOW WHOLE* 150 tablet 11   levocetirizine (XYZAL) 5 MG tablet TAKE (1/2) TABLET BY MOUTH EVERY OTHER DAY. 8 tablet 9   Melatonin 10 MG TABS Take 10 mg by mouth at bedtime.     OXYGEN Inhale 2 L into the lungs at bedtime.      rOPINIRole (REQUIP) 2 MG tablet TAKE 2 TABLETS BY MOUTH AT BEDTIME 180 tablet 1   oxyCODONE (OXY IR/ROXICODONE) 5 MG immediate release tablet Take 2 tablets (10 mg total) by mouth 2 (two) times daily. May also take 1 tablet (5 mg total) at bedtime as needed for severe pain. Must last 30 days.. 150 tablet 0   [START ON 09/24/2021] oxyCODONE (OXY  IR/ROXICODONE) 5 MG immediate release tablet Take 2 tablets (10 mg total) by mouth 2 (two) times daily. May also take 1 tablet (5 mg total) at bedtime as needed for severe pain. Must last 30 days.. 150 tablet 0   [START ON 10/24/2021] oxyCODONE (OXY IR/ROXICODONE) 5 MG immediate release tablet Take 2 tablets (10 mg total) by mouth 2 (two) times daily. May also take 1 tablet (5 mg total) at bedtime as needed for severe pain. Must last 30 days.. 150 tablet 0   No current facility-administered medications on file prior to visit.    There are no Patient Instructions on file for this visit. No follow-ups on file.   Kris Hartmann, NP

## 2021-08-28 DIAGNOSIS — L97321 Non-pressure chronic ulcer of left ankle limited to breakdown of skin: Secondary | ICD-10-CM | POA: Diagnosis not present

## 2021-08-28 DIAGNOSIS — I509 Heart failure, unspecified: Secondary | ICD-10-CM | POA: Diagnosis not present

## 2021-08-28 DIAGNOSIS — J449 Chronic obstructive pulmonary disease, unspecified: Secondary | ICD-10-CM | POA: Diagnosis not present

## 2021-08-28 DIAGNOSIS — I872 Venous insufficiency (chronic) (peripheral): Secondary | ICD-10-CM | POA: Diagnosis not present

## 2021-08-28 DIAGNOSIS — I130 Hypertensive heart and chronic kidney disease with heart failure and stage 1 through stage 4 chronic kidney disease, or unspecified chronic kidney disease: Secondary | ICD-10-CM | POA: Diagnosis not present

## 2021-08-28 DIAGNOSIS — N189 Chronic kidney disease, unspecified: Secondary | ICD-10-CM | POA: Diagnosis not present

## 2021-08-28 DIAGNOSIS — I890 Lymphedema, not elsewhere classified: Secondary | ICD-10-CM | POA: Diagnosis not present

## 2021-08-28 DIAGNOSIS — I4891 Unspecified atrial fibrillation: Secondary | ICD-10-CM | POA: Diagnosis not present

## 2021-08-28 DIAGNOSIS — M1030 Gout due to renal impairment, unspecified site: Secondary | ICD-10-CM | POA: Diagnosis not present

## 2021-09-04 ENCOUNTER — Encounter (INDEPENDENT_AMBULATORY_CARE_PROVIDER_SITE_OTHER): Payer: Self-pay | Admitting: Vascular Surgery

## 2021-09-04 DIAGNOSIS — I872 Venous insufficiency (chronic) (peripheral): Secondary | ICD-10-CM | POA: Diagnosis not present

## 2021-09-04 DIAGNOSIS — L97321 Non-pressure chronic ulcer of left ankle limited to breakdown of skin: Secondary | ICD-10-CM | POA: Diagnosis not present

## 2021-09-04 DIAGNOSIS — N189 Chronic kidney disease, unspecified: Secondary | ICD-10-CM | POA: Diagnosis not present

## 2021-09-04 DIAGNOSIS — M1030 Gout due to renal impairment, unspecified site: Secondary | ICD-10-CM | POA: Diagnosis not present

## 2021-09-04 DIAGNOSIS — J449 Chronic obstructive pulmonary disease, unspecified: Secondary | ICD-10-CM | POA: Diagnosis not present

## 2021-09-04 DIAGNOSIS — I4891 Unspecified atrial fibrillation: Secondary | ICD-10-CM | POA: Diagnosis not present

## 2021-09-04 DIAGNOSIS — I509 Heart failure, unspecified: Secondary | ICD-10-CM | POA: Diagnosis not present

## 2021-09-04 DIAGNOSIS — I890 Lymphedema, not elsewhere classified: Secondary | ICD-10-CM | POA: Diagnosis not present

## 2021-09-04 DIAGNOSIS — I130 Hypertensive heart and chronic kidney disease with heart failure and stage 1 through stage 4 chronic kidney disease, or unspecified chronic kidney disease: Secondary | ICD-10-CM | POA: Diagnosis not present

## 2021-09-07 ENCOUNTER — Ambulatory Visit: Payer: Medicare HMO | Admitting: Podiatry

## 2021-09-08 ENCOUNTER — Encounter: Payer: Self-pay | Admitting: Vascular Surgery

## 2021-09-08 ENCOUNTER — Encounter
Admission: RE | Disposition: A | Discharge status: Home / Self Care | Payer: Self-pay | Source: Ambulatory Visit | Attending: Vascular Surgery

## 2021-09-08 ENCOUNTER — Ambulatory Visit
Admission: RE | Admit: 2021-09-08 | Discharge: 2021-09-08 | Disposition: A | Discharge status: Home / Self Care | Payer: Medicare PPO | Source: Ambulatory Visit | Attending: Vascular Surgery | Admitting: Vascular Surgery

## 2021-09-08 ENCOUNTER — Other Ambulatory Visit (INDEPENDENT_AMBULATORY_CARE_PROVIDER_SITE_OTHER): Payer: Self-pay | Admitting: Nurse Practitioner

## 2021-09-08 DIAGNOSIS — I70235 Atherosclerosis of native arteries of right leg with ulceration of other part of foot: Secondary | ICD-10-CM | POA: Diagnosis not present

## 2021-09-08 DIAGNOSIS — I890 Lymphedema, not elsewhere classified: Secondary | ICD-10-CM | POA: Diagnosis not present

## 2021-09-08 DIAGNOSIS — I70299 Other atherosclerosis of native arteries of extremities, unspecified extremity: Secondary | ICD-10-CM

## 2021-09-08 DIAGNOSIS — I7025 Atherosclerosis of native arteries of other extremities with ulceration: Secondary | ICD-10-CM

## 2021-09-08 DIAGNOSIS — L97909 Non-pressure chronic ulcer of unspecified part of unspecified lower leg with unspecified severity: Secondary | ICD-10-CM

## 2021-09-08 DIAGNOSIS — E785 Hyperlipidemia, unspecified: Secondary | ICD-10-CM | POA: Diagnosis not present

## 2021-09-08 DIAGNOSIS — I70239 Atherosclerosis of native arteries of right leg with ulceration of unspecified site: Secondary | ICD-10-CM | POA: Diagnosis not present

## 2021-09-08 HISTORY — PX: LOWER EXTREMITY ANGIOGRAPHY: CATH118251

## 2021-09-08 LAB — CREATININE, SERUM
Creatinine, Ser: 1.39 mg/dL — ABNORMAL HIGH (ref 0.44–1.00)
GFR, Estimated: 36 mL/min — ABNORMAL LOW (ref >60)

## 2021-09-08 LAB — BUN: BUN: 30 mg/dL — ABNORMAL HIGH (ref 8–23)

## 2021-09-08 SURGERY — LOWER EXTREMITY ANGIOGRAPHY
Anesthesia: Moderate Sedation | Site: Leg Lower | Laterality: Right

## 2021-09-08 MED ORDER — SODIUM CHLORIDE 0.9 % IV SOLN: INTRAVENOUS | Status: AC

## 2021-09-08 MED ORDER — IODIXANOL 320 MG/ML IV SOLN
INTRAVENOUS | Status: DC | PRN
  Administered 2021-09-08: 75 mL via INTRA_ARTERIAL

## 2021-09-08 MED ORDER — CEFAZOLIN SODIUM-DEXTROSE 2-4 GM/100ML-% IV SOLN: 2.0000 g | Freq: Once | INTRAVENOUS | Status: AC

## 2021-09-08 MED ORDER — MIDAZOLAM HCL 2 MG/ML PO SYRP: 8.0000 mg | ORAL_SOLUTION | Freq: Once | ORAL | Status: DC | PRN

## 2021-09-08 MED ORDER — FAMOTIDINE 20 MG PO TABS: 40.0000 mg | ORAL_TABLET | Freq: Once | ORAL | Status: DC | PRN

## 2021-09-08 MED ORDER — SODIUM CHLORIDE 0.9% FLUSH: 3.0000 mL | Freq: Two times a day (BID) | INTRAVENOUS | Status: DC

## 2021-09-08 MED ORDER — MIDAZOLAM HCL 2 MG/2ML IJ SOLN
INTRAMUSCULAR | Status: DC | PRN
  Administered 2021-09-08: 1 mg via INTRAVENOUS

## 2021-09-08 MED ORDER — SODIUM CHLORIDE 0.9 % IV SOLN
INTRAVENOUS | Status: DC
  Administered 2021-09-08: 1000 mL via INTRAVENOUS

## 2021-09-08 MED ORDER — OXYCODONE HCL 5 MG PO TABS: 5.0000 mg | ORAL_TABLET | ORAL | Status: DC | PRN

## 2021-09-08 MED ORDER — HYDRALAZINE HCL 20 MG/ML IJ SOLN: 5.0000 mg | INTRAMUSCULAR | Status: DC | PRN

## 2021-09-08 MED ORDER — CEFAZOLIN SODIUM-DEXTROSE 2-4 GM/100ML-% IV SOLN
INTRAVENOUS | Status: AC
  Administered 2021-09-08: 2 g via INTRAVENOUS
  Filled 2021-09-08: qty 100

## 2021-09-08 MED ORDER — HEPARIN SODIUM (PORCINE) 1000 UNIT/ML IJ SOLN
INTRAMUSCULAR | Status: AC
  Filled 2021-09-08: qty 10

## 2021-09-08 MED ORDER — ACETAMINOPHEN 325 MG PO TABS: 650.0000 mg | ORAL_TABLET | ORAL | Status: DC | PRN

## 2021-09-08 MED ORDER — SODIUM CHLORIDE 0.9% FLUSH: 3.0000 mL | INTRAVENOUS | Status: DC | PRN

## 2021-09-08 MED ORDER — FENTANYL CITRATE (PF) 100 MCG/2ML IJ SOLN
INTRAMUSCULAR | Status: DC | PRN
  Administered 2021-09-08: 50 ug via INTRAVENOUS

## 2021-09-08 MED ORDER — SODIUM CHLORIDE 0.9 % IV SOLN: 250.0000 mL | INTRAVENOUS | Status: DC | PRN

## 2021-09-08 MED ORDER — HYDROMORPHONE HCL 1 MG/ML IJ SOLN: 1.0000 mg | Freq: Once | INTRAMUSCULAR | Status: DC | PRN

## 2021-09-08 MED ORDER — METHYLPREDNISOLONE SODIUM SUCC 125 MG IJ SOLR: 125.0000 mg | Freq: Once | INTRAMUSCULAR | Status: DC | PRN

## 2021-09-08 MED ORDER — FENTANYL CITRATE PF 50 MCG/ML IJ SOSY
PREFILLED_SYRINGE | INTRAMUSCULAR | Status: AC
  Filled 2021-09-08: qty 2

## 2021-09-08 MED ORDER — DIPHENHYDRAMINE HCL 50 MG/ML IJ SOLN: 50.0000 mg | Freq: Once | INTRAMUSCULAR | Status: DC | PRN

## 2021-09-08 MED ORDER — MIDAZOLAM HCL 5 MG/5ML IJ SOLN
INTRAMUSCULAR | Status: AC
  Filled 2021-09-08: qty 5

## 2021-09-08 MED ORDER — ONDANSETRON HCL 4 MG/2ML IJ SOLN: 4.0000 mg | Freq: Four times a day (QID) | INTRAMUSCULAR | Status: DC | PRN

## 2021-09-08 MED ORDER — MORPHINE SULFATE (PF) 4 MG/ML IV SOLN: 2.0000 mg | INTRAVENOUS | Status: DC | PRN

## 2021-09-08 MED ORDER — HEPARIN SODIUM (PORCINE) 1000 UNIT/ML IJ SOLN
INTRAMUSCULAR | Status: DC | PRN
  Administered 2021-09-08: 4000 [IU] via INTRAVENOUS

## 2021-09-08 MED ORDER — LABETALOL HCL 5 MG/ML IV SOLN: 10.0000 mg | INTRAVENOUS | Status: DC | PRN

## 2021-09-08 SURGICAL SUPPLY — 24 items
BALLN LUTONIX 5X220X130 (BALLOONS) ×2
BALLN LUTONIX DCB 4X40X130 (BALLOONS) ×2
BALLN LUTONIX DCB 5X60X130 (BALLOONS) ×2
BALLOON LUTONIX 5X220X130 (BALLOONS) IMPLANT
BALLOON LUTONIX DCB 4X40X130 (BALLOONS) IMPLANT
BALLOON LUTONIX DCB 5X60X130 (BALLOONS) IMPLANT
CANNULA 5F STIFF (CANNULA) ×1 IMPLANT
CATH ANGIO 5F PIGTAIL 65CM (CATHETERS) ×1 IMPLANT
CATH BEACON 5 .038 100 VERT TP (CATHETERS) ×1 IMPLANT
CATH ROTAREX 135 6FR (CATHETERS) ×1 IMPLANT
COVER PROBE U/S 5X48 (MISCELLANEOUS) ×1 IMPLANT
DEVICE STARCLOSE SE CLOSURE (Vascular Products) ×1 IMPLANT
GLIDEWIRE ADV .035X260CM (WIRE) ×1 IMPLANT
KIT ENCORE 26 ADVANTAGE (KITS) ×1 IMPLANT
LIFESTENT SOLO 6X200X135 (Permanent Stent) ×1 IMPLANT
PACK ANGIOGRAPHY (CUSTOM PROCEDURE TRAY) ×2 IMPLANT
SHEATH BRITE TIP 5FRX11 (SHEATH) ×1 IMPLANT
SHEATH RAABE 6FR (SHEATH) ×1 IMPLANT
STENT LIFESTENT 5F 6X60X135 (Permanent Stent) ×1 IMPLANT
SYR MEDRAD MARK 7 150ML (SYRINGE) ×1 IMPLANT
TUBING CONTRAST HIGH PRESS 72 (TUBING) ×1 IMPLANT
WIRE G V18X300CM (WIRE) ×1 IMPLANT
WIRE GUIDERIGHT .035X150 (WIRE) ×1 IMPLANT
WIRE MAGIC TORQUE 315CM (WIRE) ×1 IMPLANT

## 2021-09-08 NOTE — Interval H&P Note (Signed)
History and Physical Interval Note:  09/08/2021 11:55 AM  Kristin [name]  has presented today for surgery, with the diagnosis of RLE Angio   BARD    ASO w ulceration.  The various methods of treatment have been discussed with the patient and family. After consideration of risks, benefits and other options for treatment, the patient has consented to  Procedure(s): LOWER EXTREMITY ANGIOGRAPHY (Right) as a surgical intervention.  The patient's history has been reviewed, patient examined, no change in status, stable for surgery.  I have reviewed the patient's chart and labs.  Questions were answered to the patient's satisfaction.     Hortencia Pilar

## 2021-09-08 NOTE — Op Note (Signed)
[facility]  Percutaneous Study/Intervention Procedural Note   Date of Surgery: 09/08/2021  Surgeon:  Katha Cabal, MD.  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  Introduction catheter into right lower extremity 3rd order catheter placement              2.    Contrast injection right lower extremity for distal runoff             3.   Thrombectomy using the Rota Rex thrombectomy catheter right SFA and popliteal                       4.  Percutaneous transluminal angioplasty and stent placement to 5 mm right superficial femoral artery and popliteal              5.  Star close closure left common femoral arteriotomy  Anesthesia: Conscious sedation was administered under my direct supervision by the interventional radiology RN. IV Versed plus fentanyl were utilized. Continuous ECG, pulse oximetry and blood pressure was monitored throughout the entire procedure.  Conscious sedation was for a total of 53 minutes.  Sheath: 6 [place] Rabie left common femoral retrograde  Contrast: 70 cc  Fluoroscopy Time: 16.1 minutes  Indications:  Kristin [name] presents with ulceration and nonhealing wounds of the right lower extremity.  Physical examination as well as noninvasive studies have demonstrated significant atherosclerotic occlusive disease.  Angiography with the hope for intervention for limb salvage has been recommended.  The risks and benefits are reviewed all questions answered patient agrees to proceed.  Procedure:  Kristin [name] is a 88 y.o. y.o. female who was identified and appropriate procedural time out was performed.  The patient was then placed supine on the table and prepped and draped in the usual sterile fashion.    Ultrasound was placed in the sterile sleeve and the left groin was evaluated  the left common femoral artery was echolucent and pulsatile indicating patency.  Image was recorded for the permanent record and under real-time visualization a microneedle was inserted into the common femoral artery microwire followed by a micro-sheath.  A J-wire was then advanced through the micro-sheath and a  5 [place] sheath was then inserted over a J-wire. J-wire was then advanced and a 5 French pigtail catheter was positioned at the level of T12. AP projection of the aorta was then obtained. Pigtail catheter was repositioned to above the bifurcation and a LAO view of the pelvis was obtained.  Subsequently a pigtail catheter with the stiff angle Glidewire was used to cross the aortic bifurcation the catheter wire were advanced down into the right distal external iliac artery. Oblique view of the femoral bifurcation was then obtained and subsequently the wire was reintroduced and the pigtail catheter negotiated into the SFA representing third order catheter placement. Distal runoff was then performed.  Diagnostic interpretation: The abdominal aorta is opacified with a bolus injection contrast.  Is diffusely diseased but there are no hemodynamically significant stenoses.  Single renal arteries are noted no evidence of hemodynamically significant renal artery stenosis.  Normal appearing nephrograms bilaterally.  The aortic bifurcation common iliacs as well as the external iliac arteries are diffusely diseased but there are no hemodynamically significant stenoses bilaterally.  The previously placed left external iliac artery stent  is widely patent.  The right common femoral and profunda femoris are diffusely diseased but widely patent.  SFA is patent in its proximal one third beginning in its mid one third and extending down through the popliteal artery there is increasing calcific disease with multiple greater than 70% stenoses.  At [facility] canal there is an occlusion over a distance of approximately 10 cm.   The distalmost popliteal appears to be normal in caliber.  Trifurcation is diffusely diseased with subtotal occlusion of the tibioperoneal trunk and occlusion of the posterior tibial and peroneal.  Anterior tibial appears widely patent at its origin and is patent all the way down to the foot filling the dorsalis pedis.  5000 units of heparin was then given and allowed to circulate and a 6 [place] Rabie sheath was advanced up and over the bifurcation and positioned in the femoral artery  KMP  catheter and stiff angle Glidewire were then negotiated down into the distal popliteal across the occluded segment.  Once the occlusion was successfully crossed the [place] Rex catheter was then used to perform thrombectomy and regain a lumen through the occluded segment.  Multiple passes were made.  Follow up imaging demonstrated the thrombectomy was successful.  Next I attempted to advance a stent down to the mid popliteal however a greater than 80% stenosis at this level would not allow the stent to cross this lesion and therefore I advanced a 4 mm x 40 mm Lutonix balloon inflated for 1 minute.  The 6 mm x 200 mm life stent was then advanced down so that the distal edge was in the mid popliteal.  Stent was deployed without difficulty and postdilated with a 5 mm x 200 mm Lutonix drug-eluting balloon.  Follow-up imaging demonstrated less than 10% residual stenosis however we had not encompassed the entire lesion and proximally in the mid SFA I had to add a 6 mm x 60 mm life stent overlapping the initial stent slightly and this was postdilated with a 5 mm x 60 mm Lutonix drug-eluting balloon inflated to 8 atm for approximately 1 minute.  Follow-up imaging demonstrated less than 10% residual stenosis.  We then reimaged the distal runoff and found it to be widely patent and unchanged.  After review of these images the sheath is pulled into the left external iliac oblique of the common femoral is obtained and a Star close  device deployed. There no immediate complications.   Findings:   The abdominal aorta is opacified with a bolus injection contrast.  Is diffusely diseased but there are no hemodynamically significant stenoses.  Single renal arteries are noted no evidence of hemodynamically significant renal artery stenosis.  Normal appearing nephrograms bilaterally.  The aortic bifurcation common iliacs as well as the external iliac arteries are diffusely diseased but there are no hemodynamically significant stenoses bilaterally.  The previously placed left external iliac artery stent is widely patent.  The right common femoral and profunda femoris are diffusely diseased but widely patent.  SFA is patent in its proximal one third beginning in its mid one third and extending down through the popliteal artery there is increasing calcific disease with multiple greater than 70% stenoses.  At [facility] canal there is an occlusion over a distance of approximately 10 cm.  The distalmost popliteal appears to be normal in caliber.  Trifurcation is diffusely diseased with subtotal occlusion of the tibioperoneal trunk and occlusion of the posterior tibial and peroneal.  Anterior tibial appears widely patent at its origin and  is patent all the way down to the foot filling the dorsalis pedis.  Once the occlusion was successfully crossed the [place] Rex catheter was then used to perform thrombectomy and regain a lumen through the occluded segment.  This was successful.  Next I attempted to advance a stent down to the mid popliteal however a greater than 80% stenosis at this level would not allow the stent to cross this lesion and therefore I advanced a 4 mm x 40 mm Lutonix balloon inflated for 1 minute.  The 6 mm x 200 mm life stent was then advanced down so that the distal edge was in the mid popliteal.  Stent was deployed without difficulty and postdilated with a 5 mm x 200 mm Lutonix drug-eluting balloon.  Follow-up imaging demonstrated  less than 10% residual stenosis however we had not encompassed the entire lesion and proximally in the mid SFA I had to add a 6 mm x 60 mm life stent overlapping the initial stent slightly and this was postdilated with a 5 mm x 60 mm Lutonix drug-eluting balloon inflated to 8 atm for approximately 1 minute.  Follow-up imaging demonstrated less than 10% residual stenosis.  We then reimaged the distal runoff and found it to be widely patent and unchanged.   Summary: Successful recanalization right lower extremity for limb salvage                        Disposition: Patient was taken to the recovery room in stable condition having tolerated the procedure well.  Kristin [name], Dolores Lory 09/08/2021,1:12 PM

## 2021-09-09 ENCOUNTER — Telehealth (INDEPENDENT_AMBULATORY_CARE_PROVIDER_SITE_OTHER): Payer: Self-pay

## 2021-09-09 ENCOUNTER — Encounter: Payer: Self-pay | Admitting: Vascular Surgery

## 2021-09-09 NOTE — Telephone Encounter (Signed)
Patient had a RLE angio on 09/08/21 with Dr. Delana Meyer. Patient 's son is stating both legs are red and she is in more pain than before and having trouble walking. Please advise !

## 2021-09-10 ENCOUNTER — Telehealth: Payer: Self-pay

## 2021-09-10 NOTE — Telephone Encounter (Signed)
Transition Care Management Follow-up Telephone Call Date of discharge and from where: 09/08/20 [facility] How have you been since you were released from the hospital? Hurting in legs Any questions or concerns? No  Items Reviewed: Did the pt receive and understand the discharge instructions provided? Yes  Medications obtained and verified? Yes  Other? No  Any new allergies since your discharge? No  Dietary orders reviewed? No Do you have support at home? Yes   Home Care and Equipment/Supplies: Were home health services ordered? no  Functional Questionnaire: (I = Independent and D = Dependent) ADLs: I    Bathing/Dressing- I  Meal Prep- I  Eating- I  Maintaining continence- I  Transferring/Ambulation- I  Managing Meds- I  Follow up appointments reviewed:  PCP Hospital f/u appt confirmed? Yes  Scheduled to see Dr.Karamalegos on 1/24 @ 9:40. [facility] f/u appt confirmed? No   Are transportation arrangements needed? No  If their condition worsens, is the pt aware to call PCP or go to the Emergency Dept.? Yes Was the patient provided with contact information for the PCP's office or ED? Yes Was to pt encouraged to call back with questions or concerns? Yes

## 2021-09-12 DIAGNOSIS — J45998 Other asthma: Secondary | ICD-10-CM | POA: Diagnosis not present

## 2021-09-15 ENCOUNTER — Other Ambulatory Visit: Payer: Self-pay

## 2021-09-15 ENCOUNTER — Ambulatory Visit (INDEPENDENT_AMBULATORY_CARE_PROVIDER_SITE_OTHER): Payer: Medicare PPO | Admitting: Nurse Practitioner

## 2021-09-15 ENCOUNTER — Other Ambulatory Visit (INDEPENDENT_AMBULATORY_CARE_PROVIDER_SITE_OTHER): Payer: Medicare PPO

## 2021-09-15 ENCOUNTER — Ambulatory Visit
Admission: RE | Admit: 2021-09-15 | Discharge: 2021-09-15 | Disposition: A | Discharge status: Home / Self Care | Payer: Medicare PPO | Attending: Family Medicine | Admitting: Family Medicine

## 2021-09-15 ENCOUNTER — Other Ambulatory Visit (INDEPENDENT_AMBULATORY_CARE_PROVIDER_SITE_OTHER): Payer: Self-pay | Admitting: Nurse Practitioner

## 2021-09-15 ENCOUNTER — Encounter: Payer: Self-pay | Admitting: Family Medicine

## 2021-09-15 ENCOUNTER — Ambulatory Visit (INDEPENDENT_AMBULATORY_CARE_PROVIDER_SITE_OTHER): Payer: Medicare HMO | Admitting: Family Medicine

## 2021-09-15 ENCOUNTER — Encounter (INDEPENDENT_AMBULATORY_CARE_PROVIDER_SITE_OTHER): Payer: Self-pay | Admitting: Nurse Practitioner

## 2021-09-15 VITALS — BP 131/78 | HR 78 | Resp 16 | Wt 171.4 lb

## 2021-09-15 VITALS — BP 125/65 | HR 77 | Ht 67.0 in | Wt 172.6 lb

## 2021-09-15 DIAGNOSIS — I7025 Atherosclerosis of native arteries of other extremities with ulceration: Secondary | ICD-10-CM

## 2021-09-15 DIAGNOSIS — R0602 Shortness of breath: Secondary | ICD-10-CM

## 2021-09-15 DIAGNOSIS — R609 Edema, unspecified: Secondary | ICD-10-CM

## 2021-09-15 DIAGNOSIS — I890 Lymphedema, not elsewhere classified: Secondary | ICD-10-CM | POA: Diagnosis not present

## 2021-09-15 DIAGNOSIS — I872 Venous insufficiency (chronic) (peripheral): Secondary | ICD-10-CM

## 2021-09-15 DIAGNOSIS — M7989 Other specified soft tissue disorders: Secondary | ICD-10-CM

## 2021-09-15 DIAGNOSIS — I739 Peripheral vascular disease, unspecified: Secondary | ICD-10-CM

## 2021-09-15 MED ORDER — DOXYCYCLINE HYCLATE 100 MG PO CAPS: 100.0000 mg | ORAL_CAPSULE | Freq: Two times a day (BID) | ORAL | 0 refills | Status: DC

## 2021-09-15 NOTE — Progress Notes (Addendum)
Subjective:    [name], female    DOB: 1930-06-10, 88 y.o.   MRN: [id number]  Kristin [name] is a 88 y.o. 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 Brief summary of recent course, patient had symptoms of lower extremity edema and PAD, hospitalized for anticipated angiogram and stent placement.  Right lower extremity, reduced circulation on angiogram done 09/08/21, identified poor circulation, a stent was placed.  - Today reports overall has had swelling in both legs following stent, and increased pain, initially some redness has improved.  - Changes to current meds on discharge: held Lasix since did not work today  Has apt this morning after this with vascular specialist.  Trying to elevate legs.  Admits dyspnea short of breath at times. No coughing or fever or other illness.  I have reviewed the discharge medication list, and have reconciled the current and discharge medications today.   Current Outpatient Medications:    acetaminophen (TYLENOL) 500 MG tablet, Take 500 mg by mouth in the morning and at bedtime., Disp: , Rfl:    albuterol (PROVENTIL) (2.5 MG/3ML) 0.083% nebulizer solution, USE 1 VIAL IN NEBULIZER EVERY 8 HOURS AS NEEDED FOR WHEEZING OR SHORTNESS OF BREATH., Disp: 75 mL, Rfl: 3   apixaban (ELIQUIS) 2.5 MG TABS tablet, Take 1 tablet (2.5 mg total) by mouth 2 (two) times daily., Disp: 60 tablet, Rfl: 0   atorvastatin (LIPITOR) 20 MG tablet, TAKE 1 TABLET BY MOUTH ONCE DAILY  FOR CHOLESTEROL, Disp: 30 tablet, Rfl: 11   bisacodyl (DULCOLAX) 5 MG EC tablet, Take 2 tablets (10 mg total) by mouth daily as needed for moderate constipation., Disp: 180 tablet, Rfl: 1   BREZTRI AEROSPHERE 160-9-4.8 MCG/ACT AERO, Inhale 2 puffs into the lungs 2 (two) times daily., Disp: 10.7 g, Rfl: 5   Cholecalciferol (VITAMIN D) 2000 units tablet, Take 2,000 Units by mouth daily. , Disp: , Rfl:    furosemide (LASIX) 20 MG tablet, Take 0.5-1 tablets (10-20 mg total) by mouth daily as needed for edema or fluid., Disp: 30 tablet, Rfl: 2   gabapentin (NEURONTIN) 100 MG capsule, TAKE 3 CAPSULES BY MOUTH AT BEDTIME, Disp: 270 capsule, Rfl: 0   GNP ASPIRIN LOW DOSE 81 MG EC tablet, TAKE 1 TABLET BY MOUTH ONCE DAILY. *SWALLOW WHOLE*, Disp: 150 tablet, Rfl: 11   levocetirizine (XYZAL) 5 MG tablet, TAKE (1/2) TABLET BY MOUTH EVERY OTHER DAY., Disp: 8 tablet, Rfl: 9   Melatonin 10 MG TABS, Take 10 mg by mouth at bedtime., Disp: , Rfl:    oxyCODONE (OXY IR/ROXICODONE) 5 MG immediate release tablet, Take 2 tablets (10 mg total) by mouth 2 (two) times daily. May also take 1 tablet (5 mg total) at bedtime as needed for severe pain. Must last 30 days.., Disp: 150 tablet, Rfl: 0   [START ON 09/24/2021] oxyCODONE (OXY IR/ROXICODONE) 5 MG immediate release tablet, Take 2 tablets (10 mg total) by mouth 2 (two) times daily. May also take 1 tablet (5 mg total) at bedtime as needed for severe pain. Must last 30 days.., Disp:  150 tablet, Rfl: 0   [START ON 10/24/2021] oxyCODONE (OXY IR/ROXICODONE) 5 MG immediate release tablet, Take 2 tablets (10 mg total) by mouth 2 (two) times daily. May also take 1 tablet (5 mg total) at bedtime as needed for severe pain. Must last 30 days.., Disp: 150 tablet, Rfl: 0   OXYGEN, Inhale 2 L into the lungs daily., Disp: , Rfl:    rOPINIRole (REQUIP) 2 MG tablet, TAKE 2 TABLETS BY MOUTH AT BEDTIME, Disp: 180 tablet, Rfl: 1   doxycycline (VIBRAMYCIN) 100 MG capsule, Take 1 capsule (100 mg  total) by mouth 2 (two) times daily., Disp: 14 capsule, Rfl: 0  ------------------------------------------------------------------------- Social History   Tobacco Use   Smoking status: Former    Packs/day: 1.25    Years: 40.00    Pack years: 50.00    Types: Cigarettes    Quit date: 12/16/1998    Years since quitting: 22.7   Smokeless tobacco: Never  Vaping Use   Vaping Use: Never used  Substance Use Topics   Alcohol use: No    Alcohol/week: 0.0 standard drinks   Drug use: Yes    Types: Oxycodone    Review of Systems Per HPI unless specifically indicated above     Objective:    BP 125/65    Pulse 77    Ht '5\' 7"'  (1.702 m)    Wt 172 lb 9.6 oz (78.3 kg)    SpO2 97%    BMI 27.03 kg/m   Wt Readings from Last 3 Encounters:  09/15/21 171 lb 6.4 oz (77.7 kg)  09/15/21 172 lb 9.6 oz (78.3 kg)  09/08/21 160 lb (72.6 kg)    Physical Exam Vitals and nursing note reviewed.  Constitutional:      General: She is not in acute distress.    Appearance: She is well-developed. She is not diaphoretic.     Comments: Well-appearing elderly 88 yr female comfortable, cooperative  HENT:     Head: Normocephalic and atraumatic.  Eyes:     General:        Right eye: No discharge.        Left eye: No discharge.     Conjunctiva/sclera: Conjunctivae normal.  Neck:     Thyroid: No thyromegaly.  Cardiovascular:     Rate and Rhythm: Normal rate and regular rhythm.     Heart sounds: Normal heart sounds. No murmur heard. Pulmonary:     Effort: Pulmonary effort is normal. No respiratory distress.     Breath sounds: Rhonchi (lower lung fields) present. No wheezing or rales.  Musculoskeletal:        General: Normal range of motion.     Cervical back: Normal range of motion and neck supple.     Right lower leg: Edema (similar to baseline +1-2 pitting edema, no erythema, mild tender generalized, non focal) present.     Left lower leg: Edema present.  Lymphadenopathy:     Cervical: No cervical  adenopathy.  Skin:    General: Skin is warm and dry.     Findings: No erythema or rash.  Neurological:     Mental Status: She is alert and oriented to person, place, and time.  Psychiatric:        Behavior: Behavior normal.     Comments: Well groomed, good eye contact, normal speech and thoughts   Upper and Lower Extremity Assessment RUE - Strength - 5 out of 5 - Pain - 0 out of 10 - Range of Motion -  Mostly full range   LUE - Strength -5 out of 5 - Pain - 0 out of 10 - Range of Motion - mostly full range   RLE - Strength 3 out of 5 - Pain - 7 out of 10 - Range of Motion - able to flex and extend, reduced range of motion   LLE - Strength - 4 out of 5 - Pain - 2 out of 10 - Range of Motion - able to flex and extend, reduced range of motion   - Gait Pattern: Limited ambulation   Results for orders placed or performed in visit on 42/39/53  BASIC METABOLIC PANEL WITH GFR  Result Value Ref Range   Glucose, Bld 74 65 - 99 mg/dL   BUN 32 (H) 7 - 25 mg/dL   Creat 1.62 (H) 0.60 - 0.95 mg/dL   eGFR 30 (L) > OR = 60 mL/min/1.18m   BUN/Creatinine Ratio 20 6 - 22 (calc)   Sodium 140 135 - 146 mmol/L   Potassium 5.0 3.5 - 5.3 mmol/L   Chloride 104 98 - 110 mmol/L   CO2 30 20 - 32 mmol/L   Calcium 9.4 8.6 - 10.4 mg/dL  CBC with Differential/Platelet  Result Value Ref Range   WBC 6.4 3.8 - 10.8 Thousand/uL   RBC 3.89 3.80 - 5.10 Million/uL   Hemoglobin 11.2 (L) 11.7 - 15.5 g/dL   HCT 36.2 35.0 - 45.0 %   MCV 93.1 80.0 - 100.0 fL   MCH 28.8 27.0 - 33.0 pg   MCHC 30.9 (L) 32.0 - 36.0 g/dL   RDW 13.2 11.0 - 15.0 %   Platelets 179 140 - 400 Thousand/uL   MPV 10.8 7.5 - 12.5 fL   Neutro Abs 2,675 1,500 - 7,800 cells/uL   Lymphs Abs 2,458 850 - 3,900 cells/uL   Absolute Monocytes 595 200 - 950 cells/uL   Eosinophils Absolute 614 (H) 15 - 500 cells/uL   Basophils Absolute 58 0 - 200 cells/uL   Neutrophils Relative % 41.8 %   Total Lymphocyte 38.4 %   Monocytes Relative 9.3  %   Eosinophils Relative 9.6 %   Basophils Relative 0.9 %      Assessment & Plan:   Problem List Items Addressed This Visit     Venous insufficiency of both lower extremities   Other Visit Diagnoses     Shortness of breath    -  Primary   Relevant Orders   DG Chest 2 View (Completed)   PAD (peripheral artery disease) (HCC)       Relevant Orders   BASIC METABOLIC PANEL WITH GFR (Completed)   CBC with Differential/Platelet (Completed)   Leg swelling       Relevant Orders   BASIC METABOLIC PANEL WITH GFR (Completed)   CBC with Differential/Platelet (Completed)       PAD, atherosclerosis occlusive disease lower extremity Followed by [facility]ascular [facility] Recent hospitalization for angiogram and stent placement R lower ext Has had persistent swelling and redness pain after stent Has apt later this morning to return to [facility] for post-op visit and they plan to do [place] and testing.  Exam shows no evidence of cellulitis or infection today, which is reassuring. Swelling is present but similar to baseline or mild increased.  Some dyspnea identified and question if some abnormal lower lung field sounds, will check CXR for evaluation to rule out effusion or fluid. Prior imaging has had some abnormality of lungs prior - may be progression  of same.  Check BMET + CBC as requested by vascular later today as well.  Update after visit - patient has seen [facility] Eulogio Ditch NP and she has good arterial flow and negative for DVT. Requested for chemistry to rule out AKI post-angio.  For now I advised hold Furosemide Lasix since not helping swelling, keep RICE therapy elevation compression.  Mobility Assessment See above HPI Limited mobility due to chronic lumbar back pain radiculopathy, leg swelling and recurrent falls She has poor balance, risk of falls, poor endurance, weakness in lower extremities, she does hold on to walls and furniture for mobility, she has reduced  flexibility  Mobility does limit her participation in [place] in the home. Her limitations can be compensated by mobility equipment to improve her function and reduce falls allow more mobility due to her back pain and leg weakness to allow her to ambulate assisted She is capable of operating the rolling walker as she retains some upper body strength Yes walker can safely resolve her mobility deficit. Request is for rolling walker with seat.   No orders of the defined types were placed in this encounter.   Follow up plan: No follow-ups on file.   [name], [facility] 09/15/2021, 10:05 AM

## 2021-09-17 ENCOUNTER — Ambulatory Visit
Admission: RE | Admit: 2021-09-17 | Discharge: 2021-09-17 | Disposition: A | Discharge status: Home / Self Care | Payer: Medicare PPO | Source: Ambulatory Visit | Attending: Family Medicine | Admitting: Family Medicine

## 2021-09-17 ENCOUNTER — Other Ambulatory Visit: Payer: Self-pay

## 2021-09-17 ENCOUNTER — Ambulatory Visit
Admission: RE | Admit: 2021-09-17 | Discharge: 2021-09-17 | Disposition: A | Discharge status: Home / Self Care | Payer: Medicare PPO | Source: Home / Self Care | Attending: Family Medicine | Admitting: Family Medicine

## 2021-09-17 DIAGNOSIS — R0602 Shortness of breath: Secondary | ICD-10-CM | POA: Diagnosis not present

## 2021-09-17 DIAGNOSIS — I70203 Unspecified atherosclerosis of native arteries of extremities, bilateral legs: Secondary | ICD-10-CM | POA: Diagnosis present

## 2021-09-17 DIAGNOSIS — I890 Lymphedema, not elsewhere classified: Secondary | ICD-10-CM | POA: Diagnosis not present

## 2021-09-17 DIAGNOSIS — E785 Hyperlipidemia, unspecified: Secondary | ICD-10-CM | POA: Diagnosis present

## 2021-09-17 DIAGNOSIS — G894 Chronic pain syndrome: Secondary | ICD-10-CM | POA: Diagnosis present

## 2021-09-17 DIAGNOSIS — N179 Acute kidney failure, unspecified: Secondary | ICD-10-CM | POA: Diagnosis not present

## 2021-09-17 DIAGNOSIS — Z803 Family history of malignant neoplasm of breast: Secondary | ICD-10-CM | POA: Diagnosis not present

## 2021-09-17 DIAGNOSIS — I70249 Atherosclerosis of native arteries of left leg with ulceration of unspecified site: Secondary | ICD-10-CM | POA: Diagnosis not present

## 2021-09-17 DIAGNOSIS — I700 Atherosclerosis of aorta: Secondary | ICD-10-CM | POA: Diagnosis not present

## 2021-09-17 DIAGNOSIS — J9601 Acute respiratory failure with hypoxia: Secondary | ICD-10-CM | POA: Diagnosis not present

## 2021-09-17 DIAGNOSIS — Z96653 Presence of artificial knee joint, bilateral: Secondary | ICD-10-CM | POA: Diagnosis present

## 2021-09-17 DIAGNOSIS — M109 Gout, unspecified: Secondary | ICD-10-CM | POA: Diagnosis present

## 2021-09-17 DIAGNOSIS — D631 Anemia in chronic kidney disease: Secondary | ICD-10-CM | POA: Diagnosis present

## 2021-09-17 DIAGNOSIS — N3281 Overactive bladder: Secondary | ICD-10-CM | POA: Diagnosis present

## 2021-09-17 DIAGNOSIS — J984 Other disorders of lung: Secondary | ICD-10-CM | POA: Diagnosis not present

## 2021-09-17 DIAGNOSIS — M79604 Pain in right leg: Secondary | ICD-10-CM | POA: Diagnosis not present

## 2021-09-17 DIAGNOSIS — M7989 Other specified soft tissue disorders: Secondary | ICD-10-CM | POA: Diagnosis not present

## 2021-09-17 DIAGNOSIS — G629 Polyneuropathy, unspecified: Secondary | ICD-10-CM | POA: Diagnosis present

## 2021-09-17 DIAGNOSIS — I739 Peripheral vascular disease, unspecified: Secondary | ICD-10-CM | POA: Diagnosis not present

## 2021-09-17 DIAGNOSIS — Z7901 Long term (current) use of anticoagulants: Secondary | ICD-10-CM | POA: Diagnosis not present

## 2021-09-17 DIAGNOSIS — I480 Paroxysmal atrial fibrillation: Secondary | ICD-10-CM | POA: Diagnosis not present

## 2021-09-17 DIAGNOSIS — M79605 Pain in left leg: Secondary | ICD-10-CM | POA: Diagnosis not present

## 2021-09-17 DIAGNOSIS — I5033 Acute on chronic diastolic (congestive) heart failure: Secondary | ICD-10-CM | POA: Diagnosis present

## 2021-09-17 DIAGNOSIS — G2581 Restless legs syndrome: Secondary | ICD-10-CM | POA: Diagnosis present

## 2021-09-17 DIAGNOSIS — I252 Old myocardial infarction: Secondary | ICD-10-CM | POA: Diagnosis not present

## 2021-09-17 DIAGNOSIS — Z79891 Long term (current) use of opiate analgesic: Secondary | ICD-10-CM | POA: Diagnosis not present

## 2021-09-17 DIAGNOSIS — I5032 Chronic diastolic (congestive) heart failure: Secondary | ICD-10-CM | POA: Diagnosis not present

## 2021-09-17 DIAGNOSIS — I872 Venous insufficiency (chronic) (peripheral): Secondary | ICD-10-CM | POA: Diagnosis present

## 2021-09-17 DIAGNOSIS — R600 Localized edema: Secondary | ICD-10-CM | POA: Diagnosis not present

## 2021-09-17 DIAGNOSIS — N183 Chronic kidney disease, stage 3 unspecified: Secondary | ICD-10-CM | POA: Diagnosis not present

## 2021-09-17 DIAGNOSIS — K219 Gastro-esophageal reflux disease without esophagitis: Secondary | ICD-10-CM | POA: Diagnosis present

## 2021-09-17 DIAGNOSIS — Z8619 Personal history of other infectious and parasitic diseases: Secondary | ICD-10-CM | POA: Diagnosis not present

## 2021-09-17 DIAGNOSIS — I70239 Atherosclerosis of native arteries of right leg with ulceration of unspecified site: Secondary | ICD-10-CM | POA: Diagnosis not present

## 2021-09-17 DIAGNOSIS — J439 Emphysema, unspecified: Secondary | ICD-10-CM | POA: Diagnosis not present

## 2021-09-17 DIAGNOSIS — N1832 Chronic kidney disease, stage 3b: Secondary | ICD-10-CM | POA: Diagnosis present

## 2021-09-17 DIAGNOSIS — I482 Chronic atrial fibrillation, unspecified: Secondary | ICD-10-CM | POA: Diagnosis present

## 2021-09-17 DIAGNOSIS — J441 Chronic obstructive pulmonary disease with (acute) exacerbation: Secondary | ICD-10-CM | POA: Diagnosis not present

## 2021-09-17 DIAGNOSIS — Z20822 Contact with and (suspected) exposure to covid-19: Secondary | ICD-10-CM | POA: Diagnosis present

## 2021-09-17 DIAGNOSIS — Z950 Presence of cardiac pacemaker: Secondary | ICD-10-CM | POA: Diagnosis not present

## 2021-09-17 DIAGNOSIS — I5031 Acute diastolic (congestive) heart failure: Secondary | ICD-10-CM | POA: Diagnosis present

## 2021-09-17 DIAGNOSIS — I4891 Unspecified atrial fibrillation: Secondary | ICD-10-CM | POA: Diagnosis not present

## 2021-09-17 DIAGNOSIS — J9621 Acute and chronic respiratory failure with hypoxia: Secondary | ICD-10-CM | POA: Diagnosis present

## 2021-09-17 DIAGNOSIS — I509 Heart failure, unspecified: Secondary | ICD-10-CM | POA: Diagnosis not present

## 2021-09-17 DIAGNOSIS — R062 Wheezing: Secondary | ICD-10-CM | POA: Diagnosis not present

## 2021-09-17 DIAGNOSIS — I517 Cardiomegaly: Secondary | ICD-10-CM | POA: Diagnosis not present

## 2021-09-17 DIAGNOSIS — I130 Hypertensive heart and chronic kidney disease with heart failure and stage 1 through stage 4 chronic kidney disease, or unspecified chronic kidney disease: Secondary | ICD-10-CM | POA: Diagnosis present

## 2021-09-17 DIAGNOSIS — Z9981 Dependence on supplemental oxygen: Secondary | ICD-10-CM | POA: Diagnosis not present

## 2021-09-17 LAB — BASIC METABOLIC PANEL WITH GFR
BUN/Creatinine Ratio: 20 (calc) (ref 6–22)
BUN: 32 mg/dL — ABNORMAL HIGH (ref 7–25)
CO2: 30 mmol/L (ref 20–32)
Calcium: 9.4 mg/dL (ref 8.6–10.4)
Chloride: 104 mmol/L (ref 98–110)
Creat: 1.62 mg/dL — ABNORMAL HIGH (ref 0.60–0.95)
Glucose, Bld: 74 mg/dL (ref 65–99)
Potassium: 5 mmol/L (ref 3.5–5.3)
Sodium: 140 mmol/L (ref 135–146)
eGFR: 30 mL/min/{1.73_m2} — ABNORMAL LOW (ref >=60)

## 2021-09-17 LAB — CBC WITH DIFFERENTIAL/PLATELET
Absolute Monocytes: 595 cells/uL (ref 200–950)
Basophils Absolute: 58 cells/uL (ref 0–200)
Basophils Relative: 0.9 %
Eosinophils Absolute: 614 cells/uL — ABNORMAL HIGH (ref 15–500)
Eosinophils Relative: 9.6 %
HCT: 36.2 % (ref 35.0–45.0)
Hemoglobin: 11.2 g/dL — ABNORMAL LOW (ref 11.7–15.5)
Lymphs Abs: 2458 cells/uL (ref 850–3900)
MCH: 28.8 pg (ref 27.0–33.0)
MCHC: 30.9 g/dL — ABNORMAL LOW (ref 32.0–36.0)
MCV: 93.1 fL (ref 80.0–100.0)
MPV: 10.8 fL (ref 7.5–12.5)
Monocytes Relative: 9.3 %
Neutro Abs: 2675 cells/uL (ref 1500–7800)
Neutrophils Relative %: 41.8 %
Platelets: 179 10*3/uL (ref 140–400)
RBC: 3.89 10*6/uL (ref 3.80–5.10)
RDW: 13.2 % (ref 11.0–15.0)
Total Lymphocyte: 38.4 %
WBC: 6.4 10*3/uL (ref 3.8–10.8)

## 2021-09-18 ENCOUNTER — Other Ambulatory Visit: Payer: Self-pay

## 2021-09-18 ENCOUNTER — Inpatient Hospital Stay
Admission: EM | Admit: 2021-09-18 | Discharge: 2021-09-22 | DRG: 291 | Disposition: A | Discharge status: Home Health | Payer: Medicare PPO | Attending: Internal Medicine | Admitting: Internal Medicine

## 2021-09-18 ENCOUNTER — Emergency Department: Payer: Medicare PPO

## 2021-09-18 DIAGNOSIS — I739 Peripheral vascular disease, unspecified: Secondary | ICD-10-CM | POA: Diagnosis present

## 2021-09-18 DIAGNOSIS — Z8619 Personal history of other infectious and parasitic diseases: Secondary | ICD-10-CM

## 2021-09-18 DIAGNOSIS — K219 Gastro-esophageal reflux disease without esophagitis: Secondary | ICD-10-CM | POA: Diagnosis present

## 2021-09-18 DIAGNOSIS — M79605 Pain in left leg: Secondary | ICD-10-CM

## 2021-09-18 DIAGNOSIS — J9621 Acute and chronic respiratory failure with hypoxia: Secondary | ICD-10-CM | POA: Diagnosis present

## 2021-09-18 DIAGNOSIS — Z90710 Acquired absence of both cervix and uterus: Secondary | ICD-10-CM

## 2021-09-18 DIAGNOSIS — I70203 Unspecified atherosclerosis of native arteries of extremities, bilateral legs: Secondary | ICD-10-CM | POA: Diagnosis present

## 2021-09-18 DIAGNOSIS — Z950 Presence of cardiac pacemaker: Secondary | ICD-10-CM

## 2021-09-18 DIAGNOSIS — I509 Heart failure, unspecified: Secondary | ICD-10-CM

## 2021-09-18 DIAGNOSIS — Z95820 Peripheral vascular angioplasty status with implants and grafts: Secondary | ICD-10-CM

## 2021-09-18 DIAGNOSIS — Z841 Family history of disorders of kidney and ureter: Secondary | ICD-10-CM

## 2021-09-18 DIAGNOSIS — N1832 Chronic kidney disease, stage 3b: Secondary | ICD-10-CM | POA: Diagnosis present

## 2021-09-18 DIAGNOSIS — Z881 Allergy status to other antibiotic agents status: Secondary | ICD-10-CM

## 2021-09-18 DIAGNOSIS — G894 Chronic pain syndrome: Secondary | ICD-10-CM | POA: Diagnosis present

## 2021-09-18 DIAGNOSIS — D631 Anemia in chronic kidney disease: Secondary | ICD-10-CM | POA: Diagnosis present

## 2021-09-18 DIAGNOSIS — Z823 Family history of stroke: Secondary | ICD-10-CM

## 2021-09-18 DIAGNOSIS — Z7982 Long term (current) use of aspirin: Secondary | ICD-10-CM

## 2021-09-18 DIAGNOSIS — I5033 Acute on chronic diastolic (congestive) heart failure: Secondary | ICD-10-CM | POA: Diagnosis present

## 2021-09-18 DIAGNOSIS — I482 Chronic atrial fibrillation, unspecified: Secondary | ICD-10-CM | POA: Diagnosis present

## 2021-09-18 DIAGNOSIS — Z96653 Presence of artificial knee joint, bilateral: Secondary | ICD-10-CM | POA: Diagnosis present

## 2021-09-18 DIAGNOSIS — I5031 Acute diastolic (congestive) heart failure: Secondary | ICD-10-CM

## 2021-09-18 DIAGNOSIS — Z8249 Family history of ischemic heart disease and other diseases of the circulatory system: Secondary | ICD-10-CM

## 2021-09-18 DIAGNOSIS — R600 Localized edema: Secondary | ICD-10-CM

## 2021-09-18 DIAGNOSIS — M109 Gout, unspecified: Secondary | ICD-10-CM | POA: Diagnosis present

## 2021-09-18 DIAGNOSIS — I130 Hypertensive heart and chronic kidney disease with heart failure and stage 1 through stage 4 chronic kidney disease, or unspecified chronic kidney disease: Principal | ICD-10-CM | POA: Diagnosis present

## 2021-09-18 DIAGNOSIS — G629 Polyneuropathy, unspecified: Secondary | ICD-10-CM | POA: Diagnosis present

## 2021-09-18 DIAGNOSIS — M79604 Pain in right leg: Principal | ICD-10-CM

## 2021-09-18 DIAGNOSIS — G2581 Restless legs syndrome: Secondary | ICD-10-CM | POA: Diagnosis present

## 2021-09-18 DIAGNOSIS — J441 Chronic obstructive pulmonary disease with (acute) exacerbation: Secondary | ICD-10-CM | POA: Diagnosis not present

## 2021-09-18 DIAGNOSIS — Z87891 Personal history of nicotine dependence: Secondary | ICD-10-CM

## 2021-09-18 DIAGNOSIS — Z79899 Other long term (current) drug therapy: Secondary | ICD-10-CM

## 2021-09-18 DIAGNOSIS — Z9981 Dependence on supplemental oxygen: Secondary | ICD-10-CM

## 2021-09-18 DIAGNOSIS — N3281 Overactive bladder: Secondary | ICD-10-CM | POA: Diagnosis present

## 2021-09-18 DIAGNOSIS — Z803 Family history of malignant neoplasm of breast: Secondary | ICD-10-CM

## 2021-09-18 DIAGNOSIS — Z853 Personal history of malignant neoplasm of breast: Secondary | ICD-10-CM

## 2021-09-18 DIAGNOSIS — Z888 Allergy status to other drugs, medicaments and biological substances status: Secondary | ICD-10-CM

## 2021-09-18 DIAGNOSIS — N183 Chronic kidney disease, stage 3 unspecified: Secondary | ICD-10-CM | POA: Diagnosis present

## 2021-09-18 DIAGNOSIS — I252 Old myocardial infarction: Secondary | ICD-10-CM

## 2021-09-18 DIAGNOSIS — Z20822 Contact with and (suspected) exposure to covid-19: Secondary | ICD-10-CM | POA: Diagnosis present

## 2021-09-18 DIAGNOSIS — Z7951 Long term (current) use of inhaled steroids: Secondary | ICD-10-CM

## 2021-09-18 DIAGNOSIS — E785 Hyperlipidemia, unspecified: Secondary | ICD-10-CM | POA: Diagnosis present

## 2021-09-18 DIAGNOSIS — I872 Venous insufficiency (chronic) (peripheral): Secondary | ICD-10-CM | POA: Diagnosis present

## 2021-09-18 DIAGNOSIS — Z79891 Long term (current) use of opiate analgesic: Secondary | ICD-10-CM

## 2021-09-18 DIAGNOSIS — J449 Chronic obstructive pulmonary disease, unspecified: Secondary | ICD-10-CM

## 2021-09-18 DIAGNOSIS — Z7901 Long term (current) use of anticoagulants: Secondary | ICD-10-CM

## 2021-09-18 LAB — CBC WITH DIFFERENTIAL/PLATELET
Abs Immature Granulocytes: 0.02 10*3/uL (ref 0.00–0.07)
Basophils Absolute: 0.1 10*3/uL (ref 0.0–0.1)
Basophils Relative: 1 %
Eosinophils Absolute: 0.4 10*3/uL (ref 0.0–0.5)
Eosinophils Relative: 8 %
HCT: 37.6 % (ref 36.0–46.0)
Hemoglobin: 11.3 g/dL — ABNORMAL LOW (ref 12.0–15.0)
Immature Granulocytes: 0 %
Lymphocytes Relative: 34 %
Lymphs Abs: 1.8 10*3/uL (ref 0.7–4.0)
MCH: 28.8 pg (ref 26.0–34.0)
MCHC: 30.1 g/dL (ref 30.0–36.0)
MCV: 95.9 fL (ref 80.0–100.0)
Monocytes Absolute: 0.5 10*3/uL (ref 0.1–1.0)
Monocytes Relative: 8 %
Neutro Abs: 2.6 10*3/uL (ref 1.7–7.7)
Neutrophils Relative %: 49 %
Platelets: 204 10*3/uL (ref 150–400)
RBC: 3.92 MIL/uL (ref 3.87–5.11)
RDW: 15.8 % — ABNORMAL HIGH (ref 11.5–15.5)
WBC: 5.3 10*3/uL (ref 4.0–10.5)
nRBC: 0 % (ref 0.0–0.2)

## 2021-09-18 LAB — CBC
HCT: 37.2 % (ref 36.0–46.0)
Hemoglobin: 11.2 g/dL — ABNORMAL LOW (ref 12.0–15.0)
MCH: 28.8 pg (ref 26.0–34.0)
MCHC: 30.1 g/dL (ref 30.0–36.0)
MCV: 95.6 fL (ref 80.0–100.0)
Platelets: 209 10*3/uL (ref 150–400)
RBC: 3.89 MIL/uL (ref 3.87–5.11)
RDW: 15.8 % — ABNORMAL HIGH (ref 11.5–15.5)
WBC: 5.7 10*3/uL (ref 4.0–10.5)
nRBC: 0 % (ref 0.0–0.2)

## 2021-09-18 LAB — TROPONIN I (HIGH SENSITIVITY): Troponin I (High Sensitivity): 25 ng/L — ABNORMAL HIGH (ref <18)

## 2021-09-18 LAB — BASIC METABOLIC PANEL
Anion gap: 12 (ref 5–15)
BUN: 32 mg/dL — ABNORMAL HIGH (ref 8–23)
CO2: 24 mmol/L (ref 22–32)
Calcium: 9 mg/dL (ref 8.9–10.3)
Chloride: 101 mmol/L (ref 98–111)
Creatinine, Ser: 1.44 mg/dL — ABNORMAL HIGH (ref 0.44–1.00)
GFR, Estimated: 35 mL/min — ABNORMAL LOW (ref >60)
Glucose, Bld: 114 mg/dL — ABNORMAL HIGH (ref 70–99)
Potassium: 4.4 mmol/L (ref 3.5–5.1)
Sodium: 137 mmol/L (ref 135–145)

## 2021-09-18 LAB — CK: Total CK: 173 U/L (ref 38–234)

## 2021-09-18 LAB — BRAIN NATRIURETIC PEPTIDE: B Natriuretic Peptide: 1107.8 pg/mL — ABNORMAL HIGH (ref 0.0–100.0)

## 2021-09-18 LAB — LACTIC ACID, PLASMA
Lactic Acid, Venous: 2.6 mmol/L (ref 0.5–1.9)
Lactic Acid, Venous: 1.5 mmol/L (ref 0.5–1.9)

## 2021-09-18 MED ORDER — FUROSEMIDE 10 MG/ML IJ SOLN
20.0000 mg | Freq: Once | INTRAMUSCULAR | Status: AC
Start: 2021-09-19 — End: 2021-09-19
  Administered 2021-09-19: 20 mg via INTRAVENOUS
  Filled 2021-09-18: qty 4

## 2021-09-18 NOTE — ED Notes (Signed)
Attempted x2 for 22g IV placement.

## 2021-09-18 NOTE — ED Notes (Signed)
Both dorsalis pedis pulses noted with doppler and marked by this RN. Pt given warm blankets and room temp adjusted per request.

## 2021-09-18 NOTE — ED Triage Notes (Signed)
Pt to ED for bilateral leg pain and swelling.  Stent placed to right leg 2 weeks ago. Both legs wrapped. Skin in feet warm to touch. Reports pain since surgery 2 weeks ago.

## 2021-09-18 NOTE — ED Notes (Signed)
Attempted again to place 22g IV. Called phlebotomy.

## 2021-09-18 NOTE — ED Notes (Signed)
Called lab again as noticed new trop and BNP orders placed just shy of when phlebotomist collected 2nd lactic. State they will send someone soon.

## 2021-09-18 NOTE — ED Notes (Signed)
EDP Malinda back to bedside. Visitor remains with pt. Pt wrapped in lots of blankets as still feels cold. Pt and visitor given update by this RN with pt's permission.

## 2021-09-18 NOTE — ED Notes (Signed)
Leg wraps cut off pt's legs per provider Malinda's verbal order. Lab stated will send phlebotomist soon.

## 2021-09-18 NOTE — ED Notes (Signed)
Called lab as not seeing in chart where 2nd lactic collected by phlebotomy. Lab staff states they haven't received it and will notify phlebotomy again.

## 2021-09-18 NOTE — ED Notes (Signed)
Pt assisted to bedside toilet and back to stretcher. States uses a cane at home.

## 2021-09-18 NOTE — ED Notes (Signed)
Called lab to add on differential. Kristin [name] states she is looking now and thinks she should be able to add it on.

## 2021-09-18 NOTE — ED Notes (Signed)
Pt given drink of choice and a water with verbal okay from [facility].

## 2021-09-18 NOTE — ED Notes (Signed)
Pt reports history of CHF; states has been taking her furosemide regularly; states also takes eliquis. In today for swelling to both legs and tenderness to them. Pitting edema 2+ in both legs; feet warm; wraps like una boots remain on both legs; pt able to wiggle toes and move legs; SOB; pt on 2L home O2; sat 100% currently. Visitor at bedside. Stretcher locked low. Rail up. Call bell within reach.

## 2021-09-18 NOTE — ED Provider Notes (Signed)
[facility] Provider Note    Event Date/Time   First MD Initiated Contact with Patient 09/18/21 2016     (approximate)   History   Leg Pain   HPI  Kristin [name] is a 88 y.o. female who comes into the hospital complaining of leg swelling and redness.  She had a stent put in the right leg 10 days ago.  Since then both legs become red swollen and tender.  Family member with her says it appears to be getting worse every day.  Patient had a chest x-ray done yesterday which I cannot see the results appear to be a right middle lobe infiltrate.  Patient is coughing but not here in the emergency room.  Patient had an ultrasound looking for DVT done on the 14th but I cannot find any results or pictures of anything of that in the computer.  Patient has a past history of NSTEMI A. fib hypotension venous insufficiency CHF ischemic leg pain bilaterally      Physical Exam   Triage Vital Signs: ED Triage Vitals  Enc Vitals Group     BP 09/18/21 1631 140/76     Pulse Rate 09/18/21 1629 87     Resp 09/18/21 1629 20     Temp 09/18/21 1629 98.5 F (36.9 C)     Temp src --      SpO2 09/18/21 1629 96 %     Weight 09/18/21 1629 171 lb 15.3 oz (78 kg)     Height 09/18/21 1629 5\' 7"  (1.702 m)     Head Circumference --      Peak Flow --      Pain Score 09/18/21 1629 5     Pain Loc --      Pain Edu? --      Excl. in [place]? --     Most recent vital signs: Vitals:   09/18/21 2245 09/18/21 2300  BP:  138/80  Pulse: 62 (!) 59  Resp:    Temp:    SpO2:  100%     General: Awake, no distress.  CV:  Good peripheral perfusion.  Heart regular rate and rhythm no audible murmur Resp:  Normal effort.  Lungs occasional crackles Abd:  No distention.  Soft nontender Extremities slightly warm slightly red trace edema up to the knees   ED Results / Procedures / Treatments   Labs (all labs ordered are listed, but only abnormal results are displayed) Labs Reviewed  LACTIC  ACID, PLASMA - Abnormal; Notable for the following components:      Result Value   Lactic Acid, Venous 2.6 (*)    All other components within normal limits  CBC - Abnormal; Notable for the following components:   Hemoglobin 11.2 (*)    RDW 15.8 (*)    All other components within normal limits  BASIC METABOLIC PANEL - Abnormal; Notable for the following components:   Glucose, Bld 114 (*)    BUN 32 (*)    Creatinine, Ser 1.44 (*)    GFR, Estimated 35 (*)    All other components within normal limits  CBC WITH DIFFERENTIAL/PLATELET - Abnormal; Notable for the following components:   Hemoglobin 11.3 (*)    RDW 15.8 (*)    All other components within normal limits  BRAIN NATRIURETIC PEPTIDE - Abnormal; Notable for the following components:   B Natriuretic Peptide 1,107.8 (*)    All other components within normal limits  TROPONIN I (HIGH SENSITIVITY) -  Abnormal; Notable for the following components:   Troponin I (High Sensitivity) 25 (*)    All other components within normal limits  LACTIC ACID, PLASMA  CK  PROCALCITONIN     EKG     RADIOLOGY Patient's chest x-ray from yesterday was found by x-ray tech.  It does look like it could be either pneumonia or CHF.  The fact that the patient's white count is normal and she is afebrile mitigates against pneumonia.  The patient's BNP is over thousand so that is much more likely. Ultrasound of the legs read by radiology reviewed by me shows no DVT  PROCEDURES:  Critical Care performed:   Procedures   MEDICATIONS ORDERED IN ED: Medications  furosemide (LASIX) injection 20 mg (has no administration in time range)     IMPRESSION / MDM / ASSESSMENT AND PLAN / ED COURSE  I reviewed the triage vital signs and the nursing notes. Patient with chest x-ray that could be consistent with CHF her BNP is elevated her white count is normal troponin is not horribly abnormal GFR is stable lactic acid was initially elevated but is now normal  without me doing anything.  I think most likely this patient has CHF.  She has been taking 20 of Lasix p.o. a day for the last 3 days.  I will give her 20 of Lasix IV here and see how she does.  I have discussed this with her.  If she responds well we can we will try to let her go.  If not we will admit her.  I am signing the patient out to Dr. Jerl Santos who I discussed gust    The patient is on the cardiac monitor to evaluate for evidence of arrhythmia and/or significant heart rate changes.        FINAL CLINICAL IMPRESSION(S) / ED DIAGNOSES   Final diagnoses:  Bilateral leg pain  Bilateral leg edema     Rx / DC Orders   ED Discharge Orders     None        Note:  This document was prepared using Dragon voice recognition software and may include unintentional dictation errors.   Nena Polio, MD 09/19/21 Ben[facility]io Deeds

## 2021-09-18 NOTE — ED Notes (Signed)
Patient's son up to desk and reports that patient is usually on O2 at home and has not been on it since arriving to ED.  Pulse oxi 88% on room air.  Patient placed on O2 at 2 liters via nasal cannula.

## 2021-09-18 NOTE — ED Triage Notes (Signed)
First RN note:  Pt comes into the ED via POV c/o post op complication.  Pt states she had stents placed in her leg last week and since then she has gone down hill with swelling in the leg, pain in the leg, and hot to the touch.

## 2021-09-18 NOTE — ED Notes (Signed)
Pt to room now. Will assess momentarily.

## 2021-09-18 NOTE — ED Notes (Signed)
Phlebotomy at bedside.

## 2021-09-19 ENCOUNTER — Inpatient Hospital Stay: Payer: Medicare PPO

## 2021-09-19 DIAGNOSIS — Z7901 Long term (current) use of anticoagulants: Secondary | ICD-10-CM | POA: Diagnosis not present

## 2021-09-19 DIAGNOSIS — E785 Hyperlipidemia, unspecified: Secondary | ICD-10-CM | POA: Diagnosis present

## 2021-09-19 DIAGNOSIS — I482 Chronic atrial fibrillation, unspecified: Secondary | ICD-10-CM | POA: Diagnosis present

## 2021-09-19 DIAGNOSIS — I480 Paroxysmal atrial fibrillation: Secondary | ICD-10-CM | POA: Diagnosis not present

## 2021-09-19 DIAGNOSIS — G2581 Restless legs syndrome: Secondary | ICD-10-CM | POA: Diagnosis present

## 2021-09-19 DIAGNOSIS — N3281 Overactive bladder: Secondary | ICD-10-CM | POA: Diagnosis present

## 2021-09-19 DIAGNOSIS — G894 Chronic pain syndrome: Secondary | ICD-10-CM | POA: Diagnosis present

## 2021-09-19 DIAGNOSIS — I130 Hypertensive heart and chronic kidney disease with heart failure and stage 1 through stage 4 chronic kidney disease, or unspecified chronic kidney disease: Secondary | ICD-10-CM | POA: Diagnosis present

## 2021-09-19 DIAGNOSIS — N1832 Chronic kidney disease, stage 3b: Secondary | ICD-10-CM | POA: Diagnosis present

## 2021-09-19 DIAGNOSIS — I70203 Unspecified atherosclerosis of native arteries of extremities, bilateral legs: Secondary | ICD-10-CM | POA: Diagnosis present

## 2021-09-19 DIAGNOSIS — J441 Chronic obstructive pulmonary disease with (acute) exacerbation: Secondary | ICD-10-CM | POA: Diagnosis not present

## 2021-09-19 DIAGNOSIS — I5033 Acute on chronic diastolic (congestive) heart failure: Secondary | ICD-10-CM | POA: Diagnosis present

## 2021-09-19 DIAGNOSIS — J9601 Acute respiratory failure with hypoxia: Secondary | ICD-10-CM

## 2021-09-19 DIAGNOSIS — G629 Polyneuropathy, unspecified: Secondary | ICD-10-CM | POA: Diagnosis present

## 2021-09-19 DIAGNOSIS — I252 Old myocardial infarction: Secondary | ICD-10-CM | POA: Diagnosis not present

## 2021-09-19 DIAGNOSIS — Z8619 Personal history of other infectious and parasitic diseases: Secondary | ICD-10-CM | POA: Diagnosis not present

## 2021-09-19 DIAGNOSIS — Z9981 Dependence on supplemental oxygen: Secondary | ICD-10-CM | POA: Diagnosis not present

## 2021-09-19 DIAGNOSIS — Z79891 Long term (current) use of opiate analgesic: Secondary | ICD-10-CM | POA: Diagnosis not present

## 2021-09-19 DIAGNOSIS — I5031 Acute diastolic (congestive) heart failure: Secondary | ICD-10-CM | POA: Diagnosis present

## 2021-09-19 DIAGNOSIS — I509 Heart failure, unspecified: Secondary | ICD-10-CM

## 2021-09-19 DIAGNOSIS — Z96653 Presence of artificial knee joint, bilateral: Secondary | ICD-10-CM | POA: Diagnosis present

## 2021-09-19 DIAGNOSIS — D631 Anemia in chronic kidney disease: Secondary | ICD-10-CM | POA: Diagnosis present

## 2021-09-19 DIAGNOSIS — I872 Venous insufficiency (chronic) (peripheral): Secondary | ICD-10-CM | POA: Diagnosis present

## 2021-09-19 DIAGNOSIS — K219 Gastro-esophageal reflux disease without esophagitis: Secondary | ICD-10-CM | POA: Diagnosis present

## 2021-09-19 DIAGNOSIS — J9621 Acute and chronic respiratory failure with hypoxia: Secondary | ICD-10-CM | POA: Diagnosis present

## 2021-09-19 DIAGNOSIS — Z20822 Contact with and (suspected) exposure to covid-19: Secondary | ICD-10-CM | POA: Diagnosis present

## 2021-09-19 DIAGNOSIS — M109 Gout, unspecified: Secondary | ICD-10-CM | POA: Diagnosis present

## 2021-09-19 DIAGNOSIS — Z803 Family history of malignant neoplasm of breast: Secondary | ICD-10-CM | POA: Diagnosis not present

## 2021-09-19 DIAGNOSIS — N183 Chronic kidney disease, stage 3 unspecified: Secondary | ICD-10-CM | POA: Diagnosis not present

## 2021-09-19 LAB — RESP PANEL BY RT-PCR (FLU A&B, COVID) ARPGX2
Influenza A by PCR: NEGATIVE
Influenza B by PCR: NEGATIVE
SARS Coronavirus 2 by RT PCR: NEGATIVE

## 2021-09-19 LAB — BASIC METABOLIC PANEL
Anion gap: 7 (ref 5–15)
BUN: 28 mg/dL — ABNORMAL HIGH (ref 8–23)
CO2: 27 mmol/L (ref 22–32)
Calcium: 8.7 mg/dL — ABNORMAL LOW (ref 8.9–10.3)
Chloride: 102 mmol/L (ref 98–111)
Creatinine, Ser: 1.36 mg/dL — ABNORMAL HIGH (ref 0.44–1.00)
GFR, Estimated: 37 mL/min — ABNORMAL LOW (ref >60)
Glucose, Bld: 78 mg/dL (ref 70–99)
Potassium: 4.2 mmol/L (ref 3.5–5.1)
Sodium: 136 mmol/L (ref 135–145)

## 2021-09-19 LAB — CBC
HCT: 34.3 % — ABNORMAL LOW (ref 36.0–46.0)
Hemoglobin: 10.7 g/dL — ABNORMAL LOW (ref 12.0–15.0)
MCH: 28.4 pg (ref 26.0–34.0)
MCHC: 31.2 g/dL (ref 30.0–36.0)
MCV: 91 fL (ref 80.0–100.0)
Platelets: 198 10*3/uL (ref 150–400)
RBC: 3.77 MIL/uL — ABNORMAL LOW (ref 3.87–5.11)
RDW: 15.6 % — ABNORMAL HIGH (ref 11.5–15.5)
WBC: 5.5 10*3/uL (ref 4.0–10.5)
nRBC: 0 % (ref 0.0–0.2)

## 2021-09-19 LAB — PROCALCITONIN: Procalcitonin: 0.15 ng/mL

## 2021-09-19 MED ORDER — OXYCODONE HCL 5 MG PO TABS: 10.0000 mg | ORAL_TABLET | ORAL | Status: DC

## 2021-09-19 MED ORDER — FUROSEMIDE 10 MG/ML IJ SOLN
40.0000 mg | Freq: Two times a day (BID) | INTRAMUSCULAR | Status: DC
  Administered 2021-09-19 – 2021-09-22 (×7): 40 mg via INTRAVENOUS
  Filled 2021-09-19 (×7): qty 4

## 2021-09-19 MED ORDER — MELATONIN 5 MG PO TABS
10.0000 mg | ORAL_TABLET | Freq: Every day | ORAL | Status: DC
  Administered 2021-09-19 – 2021-09-21 (×3): 10 mg via ORAL
  Filled 2021-09-19 (×3): qty 2

## 2021-09-19 MED ORDER — OXYCODONE HCL 5 MG PO TABS
10.0000 mg | ORAL_TABLET | Freq: Two times a day (BID) | ORAL | Status: DC
  Administered 2021-09-19 – 2021-09-22 (×7): 10 mg via ORAL
  Filled 2021-09-19 (×8): qty 2

## 2021-09-19 MED ORDER — ASPIRIN EC 81 MG PO TBEC
81.0000 mg | DELAYED_RELEASE_TABLET | Freq: Every day | ORAL | Status: DC
  Administered 2021-09-19 – 2021-09-22 (×4): 81 mg via ORAL
  Filled 2021-09-19 (×4): qty 1

## 2021-09-19 MED ORDER — LORATADINE 10 MG PO TABS
5.0000 mg | ORAL_TABLET | Freq: Every evening | ORAL | Status: DC
  Administered 2021-09-19 – 2021-09-21 (×3): 5 mg via ORAL
  Filled 2021-09-19 (×2): qty 1
  Filled 2021-09-19: qty 0.5
  Filled 2021-09-19: qty 1

## 2021-09-19 MED ORDER — ACETAMINOPHEN 650 MG RE SUPP: 650.0000 mg | Freq: Four times a day (QID) | RECTAL | Status: DC | PRN

## 2021-09-19 MED ORDER — ACETAMINOPHEN 325 MG PO TABS
650.0000 mg | ORAL_TABLET | Freq: Four times a day (QID) | ORAL | Status: DC | PRN
  Administered 2021-09-19 – 2021-09-20 (×3): 650 mg via ORAL
  Filled 2021-09-19 (×3): qty 2

## 2021-09-19 MED ORDER — APIXABAN 2.5 MG PO TABS
2.5000 mg | ORAL_TABLET | Freq: Two times a day (BID) | ORAL | Status: DC
  Administered 2021-09-19 – 2021-09-22 (×7): 2.5 mg via ORAL
  Filled 2021-09-19 (×8): qty 1

## 2021-09-19 MED ORDER — ROPINIROLE HCL 1 MG PO TABS
4.0000 mg | ORAL_TABLET | Freq: Every day | ORAL | Status: DC
  Administered 2021-09-19 – 2021-09-21 (×3): 4 mg via ORAL
  Filled 2021-09-19 (×4): qty 4

## 2021-09-19 MED ORDER — ATORVASTATIN CALCIUM 20 MG PO TABS
20.0000 mg | ORAL_TABLET | Freq: Every day | ORAL | Status: DC
  Administered 2021-09-19 – 2021-09-22 (×4): 20 mg via ORAL
  Filled 2021-09-19 (×4): qty 1

## 2021-09-19 MED ORDER — GABAPENTIN 100 MG PO CAPS
300.0000 mg | ORAL_CAPSULE | Freq: Every day | ORAL | Status: DC
  Administered 2021-09-19 – 2021-09-21 (×3): 300 mg via ORAL
  Filled 2021-09-19 (×3): qty 3

## 2021-09-19 MED ORDER — TRAZODONE HCL 50 MG PO TABS: 25.0000 mg | ORAL_TABLET | Freq: Every evening | ORAL | Status: DC | PRN

## 2021-09-19 MED ORDER — MAGNESIUM HYDROXIDE 400 MG/5ML PO SUSP: 30.0000 mL | Freq: Every day | ORAL | Status: DC | PRN

## 2021-09-19 MED ORDER — BISACODYL 5 MG PO TBEC
10.0000 mg | DELAYED_RELEASE_TABLET | Freq: Every day | ORAL | Status: DC | PRN
  Administered 2021-09-20: 10 mg via ORAL
  Filled 2021-09-19: qty 2

## 2021-09-19 MED ORDER — OXYCODONE HCL 5 MG PO TABS: 5.0000 mg | ORAL_TABLET | Freq: Every evening | ORAL | Status: DC | PRN

## 2021-09-19 MED ORDER — VITAMIN D3 25 MCG (1000 UNIT) PO TABS
2000.0000 [IU] | ORAL_TABLET | Freq: Every day | ORAL | Status: DC
  Administered 2021-09-19 – 2021-09-22 (×4): 2000 [IU] via ORAL
  Filled 2021-09-19 (×7): qty 2

## 2021-09-19 NOTE — ED Provider Notes (Signed)
----------------------------------------- °  3:15 AM on 09/19/2021 -----------------------------------------   Patient has put out several 100 cc of urine after receiving IV Lasix.  She is on 2 L baseline oxygen continuously.  However, she ambulated to the toilet and became very tachypneic, tired with audible wheezing and unsteady on her feet.  Will discuss with hospitalist services for evaluation and admission for CHF exacerbation.  Of note, patient tells me she has gained 23 pounds over the past 2 weeks.   Paulette Blanch, MD 09/19/21 [phone]

## 2021-09-19 NOTE — ED Notes (Signed)
Pt ambulated to bedside recliner. Pt tolerated well, voiced having stiffness in her legs. Warm blanket and chux placed on chair, purewick attached and Pt given blankets. Pt is resting comfortably.

## 2021-09-19 NOTE — Progress Notes (Signed)
°  Progress Note   Patient: Kristin [name] GGE:[id number] DOB: [birth year] DOA: 09/18/2021     0 DOS: the patient was seen and examined on 09/19/2021   Brief hospital course: Same day as admission rounding note.  Please see H&P by Dr. Sidney Ace.  I agree with the assessment & plan as outlined. Follow up on cardiology recommendations.  Assessment and Plan See H&P     Subjective: Patient seen in the ED this morning holding follow-up.  She reports feeling some improvement in her breathing.  She reports some mild wheezing.  Reports she lives with her son.  No other acute complaints  Objective Vitals reviewed and notable for labile heart rate 50s-60s, elevated at times but not sustaining.  Blood pressure overall stable but mildly elevated at times somewhat labile Max BP 157/90 this morning 117/65.  O2 sats stable on 2 L/min [place] O2.  General exam: awake, alert, no acute distress HEENT: moist mucus membranes, hearing grossly normal  Respiratory system: Faint crackles and intermittent expiratory wheezes noted, normal respiratory effort at rest, on 2 L/min [place] O2. Cardiovascular system: normal S1/S2, RRR, 2+ lower extremity edema Central nervous system: A&O x3. no gross focal neurologic deficits, normal speech Extremities: moves all, lower extremity edema, normal tone Skin: dry, intact, normal temperature, no rashes seen on visualized skin Psychiatry: normal mood, congruent affect, judgement and insight appear normal   Data Reviewed:  Labs reviewed and notable for creatinine improved from 1.44-1.36, calcium 8.7, BUN improved from 30-28.  Hemoglobin 10.7 from 11.2  Family Communication: None at bedside.  Patient's son stayed overnight but had to leave earlier this morning.  Will attempt to call  Disposition: Status is: Inpatient  Remains inpatient appropriate because: Severity of illness undergoing IV diuresis as above         No charge  Author: Ezekiel Slocumb, DO 09/19/2021 4:36  PM  For on call review [email or web address].

## 2021-09-19 NOTE — ED Notes (Signed)
Pt given ice chips and moisturizer for lips- pt denies any other needs at this time

## 2021-09-19 NOTE — H&P (Signed)
[place]   PATIENT NAME: Kristin [name]    MR#:  [id number]  DATE OF BIRTH:  Aug 07, 1930  DATE OF ADMISSION:  09/18/2021  PRIMARY CARE PHYSICIAN: Olin Hauser, DO   Patient is coming from: Home  REQUESTING/REFERRING PHYSICIAN: Rada Hay, MD  CHIEF COMPLAINT:   Chief Complaint  Patient presents with   Leg Pain    HISTORY OF PRESENT ILLNESS:  Kristin [name] is a 88 y.o. African-American female with medical history significant for atrial fibrillation, diastolic CHF, CKD, COPD, gout and hepatitis C as well as dyslipidemia and hypertension, who presented to the ER with acute onset of worsening dyspnea with associated dry cough and dyspnea on exertion as well as lower extremity edema over the last few days.  She gained about 23 pounds within a month.  She denies any fever or chills.  No chest pain or palpitations.  No dysuria, oliguria or hematuria or flank pain.  No nausea or vomiting or abdominal pain. ED Course: When she came to the ER vital signs were within normal except for pulse oximetry that was down to 75% on room air and 95% on 2 L of O2 by nasal cannula..  Labs revealed a BUN of 32 and creatinine 1.44 lower than previous levels and BNP was elevated at 1100 zero 7.8 with a sensitive troponin of 25 and CK1 73.  Lactic acid was 2.6 and later 1.5 and procalcitonin 0.15.  CBC showed mild anemia.  Influenza antigens and COVID-19 PCR came back negative.  Imaging: Chest x-ray showed increased interstitial changes consistent with edema and improved but persistent right basilar airspace opacity.  Bilateral lower extremity venous Doppler came back negative for DVT.  The patient was given 20 mg of IV Lasix.  She will be admitted to a cardiac telemetry bed for further evaluation and management. PAST MEDICAL HISTORY:   Past Medical History:  Diagnosis Date   Acute postoperative pain 02/09/2018   Arrhythmia, sinus node 04/03/2014   Arthritis    Arthritis    Atrial  fibrillation (HCC)    Back pain    lower back chronic   Bradycardia    Breast cancer ([place]) 2004   right breast cancer   Cancer ([place]) 2004   rt breast cancer-post lumpectomy- chemo/rad   CHF (congestive heart failure) (HCC)    Chronic back pain    CKD (chronic kidney disease)    COPD (chronic obstructive pulmonary disease) ([place])    wears O2 at 2L via [facility] at night   Cystocele    Decubitus ulcers    Dehydration    Gout    Hematuria    Hepatitis C    Hyperlipidemia    Hypertension    Hypomagnesemia 06/25/2015   OAB (overactive bladder)    Overactive bladder    Restless leg    Shoulder pain, bilateral    Urinary frequency    UTI (lower urinary tract infection)    Vaginal atrophy     PAST SURGICAL HISTORY:   Past Surgical History:  Procedure Laterality Date   ABDOMINAL HYSTERECTOMY     BACK SURGERY     BREAST LUMPECTOMY Right 2004   BREAST SURGERY     rt lumpectomy   CARPAL TUNNEL RELEASE     CATARACT EXTRACTION W/PHACO  10/19/2011   Procedure: CATARACT EXTRACTION PHACO AND INTRAOCULAR LENS PLACEMENT ([place]);  Surgeon: Elta Guadeloupe T. Gershon Crane, MD;  Location: [facility];  Service: Ophthalmology;  Laterality: Right;  CDE:10.81  CATARACT EXTRACTION W/PHACO  11/02/2011   Procedure: CATARACT EXTRACTION PHACO AND INTRAOCULAR LENS PLACEMENT ([facility]);  Surgeon: Elta Guadeloupe T. Gershon Crane, MD;  Location: [facility];  Service: Ophthalmology;  Laterality: Left;  CDE 8.60   CHOLECYSTECTOMY     3/18   COLON SURGERY     INSERT / REPLACE / REMOVE PACEMAKER     JOINT REPLACEMENT     bilateral TKA   LOWER EXTREMITY ANGIOGRAPHY Left 10/14/2020   Procedure: LOWER EXTREMITY ANGIOGRAPHY;  Surgeon: Katha Cabal, MD;  Location: [facility];  Service: Cardiovascular;  Laterality: Left;   LOWER EXTREMITY ANGIOGRAPHY Right 09/08/2021   Procedure: LOWER EXTREMITY ANGIOGRAPHY;  Surgeon: Katha Cabal, MD;  Location: [facility];  Service: Cardiovascular;  Laterality: Right;   PACEMAKER INSERTION N/A  12/24/2015   Procedure: INSERTION PACEMAKER;  Surgeon: Isaias Cowman, MD;  Location: [facility];  Service: Cardiovascular;  Laterality: N/A;   ROTATOR CUFF REPAIR     bilateral    SOCIAL HISTORY:   Social History   Tobacco Use   Smoking status: Former    Packs/day: 1.25    Years: 40.00    Pack years: 50.00    Types: Cigarettes    Quit date: 12/16/1998    Years since quitting: 22.7   Smokeless tobacco: Never  Substance Use Topics   Alcohol use: No    Alcohol/week: 0.0 standard drinks    FAMILY HISTORY:   Family History  Problem Relation Age of Onset   Kidney disease Brother    Heart disease Father    Heart disease Mother    Stroke Mother    Cancer Sister    Breast cancer Sister 55   Breast cancer Paternal Aunt    Anesthesia problems Neg Hx    Hypotension Neg Hx    Malignant hyperthermia Neg Hx    Pseudochol deficiency Neg Hx     DRUG ALLERGIES:   Allergies  Allergen Reactions   Flexeril [Cyclobenzaprine Hcl]     Severe Rash   Vancomycin Rash    [place] Syndrome    REVIEW OF SYSTEMS:   ROS As per history of present illness. All pertinent systems were reviewed above. Constitutional, HEENT, cardiovascular, respiratory, GI, GU, musculoskeletal, neuro, psychiatric, endocrine, integumentary and hematologic systems were reviewed and are otherwise negative/unremarkable except for positive findings mentioned above in the HPI.   MEDICATIONS AT HOME:   Prior to Admission medications   Medication Sig Start Date End Date Taking? Authorizing Provider  acetaminophen (TYLENOL) 500 MG tablet Take 500 mg by mouth in the morning and at bedtime.   Yes [provider]  albuterol (PROVENTIL) (2.5 MG/3ML) 0.083% nebulizer solution USE 1 VIAL IN NEBULIZER EVERY 8 HOURS AS NEEDED FOR WHEEZING OR SHORTNESS OF BREATH. 05/21/21  Yes Karamalegos, Devonne Doughty, DO  apixaban (ELIQUIS) 2.5 MG TABS tablet Take 1 tablet (2.5 mg total) by mouth 2 (two) times daily. 04/09/21  Yes  Wieting, [name], MD  atorvastatin (LIPITOR) 20 MG tablet TAKE 1 TABLET BY MOUTH ONCE DAILY FOR CHOLESTEROL 02/17/21  Yes Karamalegos, Devonne Doughty, DO  bisacodyl (DULCOLAX) 5 MG EC tablet Take 2 tablets (10 mg total) by mouth daily as needed for moderate constipation. 07/04/20  Yes Karamalegos, Alexander J, DO  BREZTRI AEROSPHERE 160-9-4.8 MCG/ACT AERO Inhale 2 puffs into the lungs 2 (two) times daily. 07/07/21  Yes Karamalegos, Devonne Doughty, DO  Cholecalciferol (VITAMIN D) 2000 units tablet Take 2,000 Units by mouth daily.    Yes [provider]  doxycycline (VIBRAMYCIN) 100 MG capsule Take 1 capsule (100 mg total) by mouth 2 (two) times daily. 09/15/21  Yes Kris Hartmann, NP  gabapentin (NEURONTIN) 100 MG capsule TAKE 3 CAPSULES BY MOUTH AT BEDTIME 06/29/21  Yes Karamalegos, Alexander J, DO  GNP ASPIRIN LOW DOSE 81 MG EC tablet TAKE 1 TABLET BY MOUTH ONCE DAILY. *SWALLOW WHOLE* 08/18/21  Yes Schnier, Dolores Lory, MD  levocetirizine (XYZAL) 5 MG tablet TAKE (1/2) TABLET BY MOUTH EVERY OTHER DAY. 06/19/21  Yes Karamalegos, Devonne Doughty, DO  Melatonin 10 MG TABS Take 10 mg by mouth at bedtime.   Yes [provider]  oxyCODONE (OXY IR/ROXICODONE) 5 MG immediate release tablet Take 2 tablets (10 mg total) by mouth 2 (two) times daily. May also take 1 tablet (5 mg total) at bedtime as needed for severe pain. Must last 30 days.. 08/25/21 09/24/21 Yes Milinda Pointer, MD  OXYGEN Inhale 2 L into the lungs daily.   Yes [provider]  rOPINIRole (REQUIP) 2 MG tablet TAKE 2 TABLETS BY MOUTH AT BEDTIME 04/23/21  Yes Karamalegos, Devonne Doughty, DO  furosemide (LASIX) 20 MG tablet Take 0.5-1 tablets (10-20 mg total) by mouth daily as needed for edema or fluid. 07/31/21   Karamalegos, Devonne Doughty, DO  oxyCODONE (OXY IR/ROXICODONE) 5 MG immediate release tablet Take 2 tablets (10 mg total) by mouth 2 (two) times daily. May also take 1 tablet (5 mg total) at bedtime as needed for severe pain. Must  last 30 days.. 09/24/21 10/24/21  Milinda Pointer, MD  oxyCODONE (OXY IR/ROXICODONE) 5 MG immediate release tablet Take 2 tablets (10 mg total) by mouth 2 (two) times daily. May also take 1 tablet (5 mg total) at bedtime as needed for severe pain. Must last 30 days.. 10/24/21 11/23/21  Milinda Pointer, MD      VITAL SIGNS:  Blood pressure 130/81, pulse 61, temperature 98.2 F (36.8 C), temperature source Oral, resp. rate 20, height 5\' 7"  (1.702 m), weight 78 kg, SpO2 95 %.  PHYSICAL EXAMINATION:  Physical Exam  GENERAL:  88 y.o.-year-old African-American female patient lying in the bed with mild respiratory distress with conversational dyspnea.  EYES: Pupils equal, round, reactive to light and accommodation. No scleral icterus. Extraocular muscles intact.  HEENT: Head atraumatic, normocephalic. Oropharynx and nasopharynx clear.  NECK:  Supple, no jugular venous distention. No thyroid enlargement, no tenderness.  LUNGS: Diminished bibasilar breath sounds with bibasal rales.  No use of accessory muscles of respiration.  CARDIOVASCULAR: Regular rate and rhythm, S1, S2 normal. No murmurs, rubs, or gallops.  ABDOMEN: Soft, nondistended, nontender. Bowel sounds present. No organomegaly or mass.  EXTREMITIES: 1+ bilateral lower extremity pitting edema with no cyanosis, or clubbing.  NEUROLOGIC: Cranial nerves II through XII are intact. Muscle strength 5/5 in all extremities. Sensation intact. Gait not checked.  PSYCHIATRIC: The patient is alert and oriented x 3.  Normal affect and good eye contact. SKIN: No obvious rash, lesion, or ulcer.   LABORATORY PANEL:   CBC Recent Labs  Lab 09/18/21 1647  WBC 5.3   5.7  HGB 11.3*   11.2*  HCT 37.6   37.2  PLT 204   209   ------------------------------------------------------------------------------------------------------------------  Chemistries  Recent Labs  Lab 09/18/21 1647  NA 137  K 4.4  CL 101  CO2 24  GLUCOSE 114*  BUN 32*   CREATININE 1.44*  CALCIUM 9.0   ------------------------------------------------------------------------------------------------------------------  Cardiac Enzymes No results for input(s): TROPONINI in the last 168 hours. ------------------------------------------------------------------------------------------------------------------  RADIOLOGY:  DG Chest 2 View  Result Date: 09/17/2021 CLINICAL DATA:  Postop angiogram/stent of the lower extremity. Worse dyspnea. Normal oxygen. Shortness of breath. Swelling in legs. EXAM: CHEST - 2 VIEW COMPARISON:  Chest two views 07/18/2021 FINDINGS: Left chest wall cardiac pacer is seen with leads overlying the right atrium and right ventricle. Cardiac silhouette is again moderately enlarged. Mediastinal contours are unchanged. Calcification is again seen within the aortic arch. There is again diffuse chronic bilateral interstitial thickening. There is heterogeneous opacification overlying the inferior right lung at the right hemidiaphragm. This is similar to 07/18/2021. Again appears mildly worsened compared to 04/07/2021. No pleural effusion is seen on lateral view. No pneumothorax. Moderate multilevel degenerative disc changes of the thoracic spine. Right axillary surgical clips. Status post bilateral reverse total shoulder arthroplasty. IMPRESSION:: IMPRESSION: 1. Right hemidiaphragm heterogeneous airspace opacification is similar to 07/18/2021. This may reflect atelectasis scarring, or pneumonia. Again this appears worsened compared to 04/07/2021. 2. Chronic interstitial lung disease. 3.  Aortic Atherosclerosis (ICD10-I70.0). Electronically Signed   By: Yvonne Kendall M.D.   On: 09/17/2021 11:18   US Venous Img Lower Bilateral  Result Date: 09/19/2021 CLINICAL DATA:  Bilateral leg pain and swelling, initial encounter EXAM: BILATERAL LOWER EXTREMITY VENOUS DOPPLER ULTRASOUND TECHNIQUE: Gray-scale sonography with graded compression, as well as color Doppler  and duplex ultrasound were performed to evaluate the lower extremity deep venous systems from the level of the common femoral vein and including the common femoral, femoral, profunda femoral, popliteal and calf veins including the posterior tibial, peroneal and gastrocnemius veins when visible. The superficial great saphenous vein was also interrogated. Spectral Doppler was utilized to evaluate flow at rest and with distal augmentation maneuvers in the common femoral, femoral and popliteal veins. COMPARISON:  None. FINDINGS: RIGHT LOWER EXTREMITY Common Femoral Vein: No evidence of thrombus. Normal compressibility, respiratory phasicity and response to augmentation. Saphenofemoral Junction: No evidence of thrombus. Normal compressibility and flow on color Doppler imaging. Profunda Femoral Vein: No evidence of thrombus. Normal compressibility and flow on color Doppler imaging. Femoral Vein: No evidence of thrombus. Normal compressibility, respiratory phasicity and response to augmentation. Popliteal Vein: No evidence of thrombus. Normal compressibility, respiratory phasicity and response to augmentation. Calf Veins: No evidence of thrombus. Normal compressibility and flow on color Doppler imaging. Superficial Great Saphenous Vein: No evidence of thrombus. Normal compressibility. Venous Reflux:  None. Other Findings:  Soft tissue edema is noted. LEFT LOWER EXTREMITY Common Femoral Vein: No evidence of thrombus. Normal compressibility, respiratory phasicity and response to augmentation. Saphenofemoral Junction: No evidence of thrombus. Normal compressibility and flow on color Doppler imaging. Profunda Femoral Vein: No evidence of thrombus. Normal compressibility and flow on color Doppler imaging. Femoral Vein: No evidence of thrombus. Normal compressibility, respiratory phasicity and response to augmentation. Popliteal Vein: No evidence of thrombus. Normal compressibility, respiratory phasicity and response to  augmentation. Calf Veins: No evidence of thrombus. Normal compressibility and flow on color Doppler imaging. Superficial Great Saphenous Vein: No evidence of thrombus. Normal compressibility. Venous Reflux:  None. Other Findings:  Soft tissue edema is noted. IMPRESSION: No evidence of deep venous thrombosis in either lower extremity. Electronically Signed   By: [name] M.D.   On: 09/19/2021 00:05   DG Chest Port 1 View  Result Date: 09/19/2021 CLINICAL DATA:  Shortness of breath EXAM: PORTABLE CHEST 1 VIEW COMPARISON:  09/17/2021 FINDINGS: Cardiac shadow is enlarged. Pacing device is again identified. Lungs are well aerated bilaterally. Increasing interstitial markings are noted bilaterally consistent  with developing edema. Persistent but improved airspace opacity is noted in right lung base. Postsurgical changes are noted. IMPRESSION: Increasing interstitial change consistent with edema. Improving but persistent right basilar airspace opacity. Electronically Signed   By: [name] M.D.   On: 09/19/2021 03:53      IMPRESSION AND PLAN:  Principal Problem:   Acute CHF (congestive heart failure) ([place])  1.  Acute on chronic diastolic CHF.  The patient has subsequent acute hypoxic respiratory failure. - The patient will be admitted to a cardiac telemetry bed. - We will continue diuresis with IV Lasix. - We will follow serial troponins. - The patient had a 2D echo on 05/24/2020 revealed an EF of 60 to 65% with grade 1 diastolic dysfunction mild to moderate right atrial dilatation and moderate left atrial dilatation with trace mitral regurgitation. - Cardiology consult to be obtained. - I notified Dr. Clayborn Bigness with about the patient.  2.  Paroxysmal atrial fibrillation. - We will obtain an EKG. - We will continue Eliquis.  3.  Dyslipidemia. - We will continue statin therapy.  4.  Peripheral neuropathy. - We will continue Neurontin  DVT prophylaxis: Eliquis.   Code Status: full  code. Family Communication:  The plan of care was discussed in details with the patient (and family). I answered all questions. The patient agreed to proceed with the above mentioned plan. Further management will depend upon hospital course. Disposition Plan: Back to previous home environment Consults called: Cardiology.  All the records are reviewed and case discussed with ED provider.  Status is: Inpatient   At the time of the admission, it appears that the appropriate admission status for this patient is inpatient.  This is judged to be reasonable and necessary in order to provide the required intensity of service to ensure the patient's safety given the presenting symptoms, physical exam findings and initial radiographic and laboratory data in the context of comorbid conditions.  The patient requires inpatient status due to high intensity of service, high risk of further deterioration and high frequency of surveillance required.  I certify that at the time of admission, it is my clinical judgment that the patient will require inpatient hospital care extending more than 2 midnights.                            Dispo: The patient is from: Home              Anticipated d/c is to: Home              Patient currently is not medically stable to d/c.              Difficult to place patient: No  Christel Mormon M.D on 09/19/2021 at 5:51 AM  [facility]   From 7 PM-7 AM, contact night-coverage [email or web address]  CC: Primary care physician; Olin Hauser, DO

## 2021-09-19 NOTE — Progress Notes (Signed)
Brief consult note cardiology  Impression Lower extremity edema Congestive heart failure Shortness of breath Hypertension Leg pain Chronic renal sufficiency COPD Atrial fibrillation  Plan Agree with IV diuresis for heart failure Follow-up BMP Agree with ultrasound to rule out DVT Support stockings elevation Maintain anticoagulation for atrial fibrillation Supplemental oxygen as necessary

## 2021-09-19 NOTE — ED Notes (Addendum)
Failed purewick. Large amount of urine on bed linens. Pt ambulated to toilet with walker. Patient SOB with exertion. Mild general weakness with ambulation and being able to get herself up from toilet. Bed linens changed. Peri care and dry brief provided. New purewick placed. Warm blankets provided to patient.

## 2021-09-19 NOTE — ED Notes (Signed)
Pt given a sandwich tray ?

## 2021-09-19 NOTE — Hospital Course (Addendum)
88 y.o. African-American female with medical history significant for atrial fibrillation, diastolic CHF, CKD, COPD, gout and hepatitis C as well as dyslipidemia and hypertension, who presented to the ER with worsening dyspnea with associated dry cough and dyspnea on exertion, progressive lower extremity edema over the last few days, and reporting about 23 pound weight gain within a month.  Evaluation in the ED was consistent with acute on chronic diastolic CHF with elevated BNP and chest x-ray showing vascular congestion pulmonary edema.  Started on IV diuresis and admitted.  Cardiology consulted.  Patient was subsequently also started on steroids for COPD with acute exacerbation.

## 2021-09-19 NOTE — Consult Note (Signed)
CARDIOLOGY CONSULT NOTE               [name] MRN: [id number] DOB/AGE: 1929/10/30 88 y.o.  Admit date: 09/18/2021 Referring Physician Dr. Garner Gavel hospitalist Primary Physician Dr. [name] Primary Cardiologist Paraschos Reason for Consultation shortness of breath leg pain  HPI: 88 year old female presents with chronic atrial fibrillation diastolic congestive heart failure renal sufficiency COPD hypertension hyperlipidemia presented emergency room with worsening dyspnea cough most with exertion significant weight gain lower extremity edema denies any chest pain patient was found to be hypoxic off of oxygen but at 95 on 2 L he had worsening renal sufficiency elevated BNP troponins were flat chest x-ray suggested congestion so patient was treated with IV Lasix and advised to be admitted for further evaluation and management  Review of systems complete and found to be negative unless listed above     Past Medical History:  Diagnosis Date   Acute postoperative pain 02/09/2018   Arrhythmia, sinus node 04/03/2014   Arthritis    Arthritis    Atrial fibrillation (HCC)    Back pain    lower back chronic   Bradycardia    Breast cancer ([place]) 2004   right breast cancer   Cancer ([place]) 2004   rt breast cancer-post lumpectomy- chemo/rad   CHF (congestive heart failure) (HCC)    Chronic back pain    CKD (chronic kidney disease)    COPD (chronic obstructive pulmonary disease) ([place])    wears O2 at 2L via [place] at night   Cystocele    Decubitus ulcers    Dehydration    Gout    Hematuria    Hepatitis C    Hyperlipidemia    Hypertension    Hypomagnesemia 06/25/2015   OAB (overactive bladder)    Overactive bladder    Restless leg    Shoulder pain, bilateral    Urinary frequency    UTI (lower urinary tract infection)    Vaginal atrophy     Past Surgical History:  Procedure Laterality Date   ABDOMINAL HYSTERECTOMY     BACK SURGERY     BREAST LUMPECTOMY  Right 2004   BREAST SURGERY     rt lumpectomy   CARPAL TUNNEL RELEASE     CATARACT EXTRACTION W/PHACO  10/19/2011   Procedure: CATARACT EXTRACTION PHACO AND INTRAOCULAR LENS PLACEMENT ([place]);  Surgeon: Elta Guadeloupe T. Gershon Crane, MD;  Location: [facility];  Service: Ophthalmology;  Laterality: Right;  CDE:10.81   CATARACT EXTRACTION W/PHACO  11/02/2011   Procedure: CATARACT EXTRACTION PHACO AND INTRAOCULAR LENS PLACEMENT ([facility]);  Surgeon: Elta Guadeloupe T. Gershon Crane, MD;  Location: [facility];  Service: Ophthalmology;  Laterality: Left;  CDE 8.60   CHOLECYSTECTOMY     3/18   COLON SURGERY     INSERT / REPLACE / REMOVE PACEMAKER     JOINT REPLACEMENT     bilateral TKA   LOWER EXTREMITY ANGIOGRAPHY Left 10/14/2020   Procedure: LOWER EXTREMITY ANGIOGRAPHY;  Surgeon: Katha Cabal, MD;  Location: [facility];  Service: Cardiovascular;  Laterality: Left;   LOWER EXTREMITY ANGIOGRAPHY Right 09/08/2021   Procedure: LOWER EXTREMITY ANGIOGRAPHY;  Surgeon: Katha Cabal, MD;  Location: [facility];  Service: Cardiovascular;  Laterality: Right;   PACEMAKER INSERTION N/A 12/24/2015   Procedure: INSERTION PACEMAKER;  Surgeon: Isaias Cowman, MD;  Location: [facility];  Service: Cardiovascular;  Laterality: N/A;   ROTATOR CUFF REPAIR     bilateral    (Not in a hospital  admission)  Social History   Socioeconomic History   Marital status: Single    Spouse name: Not on file   Number of children: 2   Years of education: 74   Highest education level: 12th grade  Occupational History   Occupation: retired  Tobacco Use   Smoking status: Former    Packs/day: 1.25    Years: 40.00    Pack years: 50.00    Types: Cigarettes    Quit date: 12/16/1998    Years since quitting: 22.7   Smokeless tobacco: Never  Vaping Use   Vaping Use: Never used  Substance and Sexual Activity   Alcohol use: No    Alcohol/week: 0.0 standard drinks   Drug use: Yes    Types: Oxycodone   Sexual activity: Not Currently     Birth control/protection: Surgical  Other Topics Concern   Not on file  Social History Narrative   Not on file   Social Determinants of Health   Financial Resource Strain: Not on file  Food Insecurity: No Food Insecurity   Worried About Running Out of Food in the Last Year: Never true   [place] in the Last Year: Never true  Transportation Needs: No Transportation Needs   Lack of Transportation (Medical): No   Lack of Transportation (Non-Medical): No  Physical Activity: Not on file  Stress: Not on file  Social Connections: Not on file  Intimate Partner Violence: Not on file    Family History  Problem Relation Age of Onset   Kidney disease Brother    Heart disease Father    Heart disease Mother    Stroke Mother    Cancer Sister    Breast cancer Sister 58   Breast cancer Paternal Aunt    Anesthesia problems Neg Hx    Hypotension Neg Hx    Malignant hyperthermia Neg Hx    Pseudochol deficiency Neg Hx       Review of systems complete and found to be negative unless listed above      PHYSICAL EXAM  General: Well developed, well nourished, in no acute distress HEENT:  Normocephalic and atramatic Neck:  No JVD.  Lungs: Clear bilaterally to auscultation and percussion. Heart: HRRR . Normal S1 and S2 without gallops or murmurs.  Abdomen: Bowel sounds are positive, abdomen soft and non-tender  Msk:  Back normal, normal gait. Normal strength and tone for age. Extremities: No clubbing, cyanosis or 2+edema.   Neuro: Alert and oriented X 3. Psych:  Good affect, responds appropriately  Labs:   Lab Results  Component Value Date   WBC 5.5 09/19/2021   HGB 10.7 (L) 09/19/2021   HCT 34.3 (L) 09/19/2021   MCV 91.0 09/19/2021   PLT 198 09/19/2021    Recent Labs  Lab 09/19/21 0705  NA 136  K 4.2  CL 102  CO2 27  BUN 28*  CREATININE 1.36*  CALCIUM 8.7*  GLUCOSE 78   Lab Results  Component Value Date   CKTOTAL 173 09/18/2021   TROPONINI <0.03 08/14/2017     Lab Results  Component Value Date   CHOL 158 04/02/2021   CHOL 199 04/01/2020   CHOL 204 (H) 03/02/2016   Lab Results  Component Value Date   HDL 68 04/02/2021   HDL 62 04/01/2020   HDL 58 03/02/2016   Lab Results  Component Value Date   LDLCALC 77 04/02/2021   LDLCALC 117 (H) 04/01/2020   LDLCALC 131 (H) 03/02/2016   Lab  Results  Component Value Date   TRIG 57 04/02/2021   TRIG 92 04/01/2020   TRIG 76 03/02/2016   Lab Results  Component Value Date   CHOLHDL 2.3 04/02/2021   CHOLHDL 3.2 04/01/2020   CHOLHDL 3.5 03/02/2016   No results found for: LDLDIRECT    Radiology: DG Chest 2 View  Result Date: 09/17/2021 CLINICAL DATA:  Postop angiogram/stent of the lower extremity. Worse dyspnea. Normal oxygen. Shortness of breath. Swelling in legs. EXAM: CHEST - 2 VIEW COMPARISON:  Chest two views 07/18/2021 FINDINGS: Left chest wall cardiac pacer is seen with leads overlying the right atrium and right ventricle. Cardiac silhouette is again moderately enlarged. Mediastinal contours are unchanged. Calcification is again seen within the aortic arch. There is again diffuse chronic bilateral interstitial thickening. There is heterogeneous opacification overlying the inferior right lung at the right hemidiaphragm. This is similar to 07/18/2021. Again appears mildly worsened compared to 04/07/2021. No pleural effusion is seen on lateral view. No pneumothorax. Moderate multilevel degenerative disc changes of the thoracic spine. Right axillary surgical clips. Status post bilateral reverse total shoulder arthroplasty. IMPRESSION:: IMPRESSION: 1. Right hemidiaphragm heterogeneous airspace opacification is similar to 07/18/2021. This may reflect atelectasis scarring, or pneumonia. Again this appears worsened compared to 04/07/2021. 2. Chronic interstitial lung disease. 3.  Aortic Atherosclerosis (ICD10-I70.0). Electronically Signed   By: Yvonne Kendall M.D.   On: 09/17/2021 11:18   PERIPHERAL  VASCULAR CATHETERIZATION  Result Date: 09/08/2021 See surgical note for result.  US Venous Img Lower Bilateral  Result Date: 09/19/2021 CLINICAL DATA:  Bilateral leg pain and swelling, initial encounter EXAM: BILATERAL LOWER EXTREMITY VENOUS DOPPLER ULTRASOUND TECHNIQUE: Gray-scale sonography with graded compression, as well as color Doppler and duplex ultrasound were performed to evaluate the lower extremity deep venous systems from the level of the common femoral vein and including the common femoral, femoral, profunda femoral, popliteal and calf veins including the posterior tibial, peroneal and gastrocnemius veins when visible. The superficial great saphenous vein was also interrogated. Spectral Doppler was utilized to evaluate flow at rest and with distal augmentation maneuvers in the common femoral, femoral and popliteal veins. COMPARISON:  None. FINDINGS: RIGHT LOWER EXTREMITY Common Femoral Vein: No evidence of thrombus. Normal compressibility, respiratory phasicity and response to augmentation. Saphenofemoral Junction: No evidence of thrombus. Normal compressibility and flow on color Doppler imaging. Profunda Femoral Vein: No evidence of thrombus. Normal compressibility and flow on color Doppler imaging. Femoral Vein: No evidence of thrombus. Normal compressibility, respiratory phasicity and response to augmentation. Popliteal Vein: No evidence of thrombus. Normal compressibility, respiratory phasicity and response to augmentation. Calf Veins: No evidence of thrombus. Normal compressibility and flow on color Doppler imaging. Superficial Great Saphenous Vein: No evidence of thrombus. Normal compressibility. Venous Reflux:  None. Other Findings:  Soft tissue edema is noted. LEFT LOWER EXTREMITY Common Femoral Vein: No evidence of thrombus. Normal compressibility, respiratory phasicity and response to augmentation. Saphenofemoral Junction: No evidence of thrombus. Normal compressibility and flow on color  Doppler imaging. Profunda Femoral Vein: No evidence of thrombus. Normal compressibility and flow on color Doppler imaging. Femoral Vein: No evidence of thrombus. Normal compressibility, respiratory phasicity and response to augmentation. Popliteal Vein: No evidence of thrombus. Normal compressibility, respiratory phasicity and response to augmentation. Calf Veins: No evidence of thrombus. Normal compressibility and flow on color Doppler imaging. Superficial Great Saphenous Vein: No evidence of thrombus. Normal compressibility. Venous Reflux:  None. Other Findings:  Soft tissue edema is noted. IMPRESSION: No evidence of deep venous thrombosis  in either lower extremity. Electronically Signed   By: [name] M.D.   On: 09/19/2021 00:05   DG Chest Port 1 View  Result Date: 09/19/2021 CLINICAL DATA:  Shortness of breath EXAM: PORTABLE CHEST 1 VIEW COMPARISON:  09/17/2021 FINDINGS: Cardiac shadow is enlarged. Pacing device is again identified. Lungs are well aerated bilaterally. Increasing interstitial markings are noted bilaterally consistent with developing edema. Persistent but improved airspace opacity is noted in right lung base. Postsurgical changes are noted. IMPRESSION: Increasing interstitial change consistent with edema. Improving but persistent right basilar airspace opacity. Electronically Signed   By: [name] M.D.   On: 09/19/2021 03:53   VAS [name] LOWER EXTREMITY ARTERIAL DUPLEX  Result Date: 08/27/2021 LOWER EXTREMITY ARTERIAL DUPLEX STUDY Patient Name:  KAMIL HANIGAN  Date of Exam:   08/19/2021 Medical Rec #: [id number]      Accession #:    [id number] Date of Birth: [birth year]       Patient Gender: F Patient Age:   89 years Exam Location:  [facility] Procedure:      VAS [name] LOWER EXTREMITY ARTERIAL DUPLEX Referring Phys: Eulogio Ditch --------------------------------------------------------------------------------  Indications: Peripheral artery disease.  Vascular Interventions:  10/14/2020: PTA of the Left SFA and Popliteal Artery.                         PTA of the Left Posterior tibial Artery and                         Tibioperoneal trunk. PTA and Stent placement Left EIA. Current ABI:            Rt .84,Lt 1.11 Comparison Study: 06/04/2021 Performing Technologist: Almira Coaster RVS  Examination Guidelines: A complete evaluation includes B-mode imaging, spectral Doppler, color Doppler, and power Doppler as needed of all accessible portions of each vessel. Bilateral testing is considered an integral part of a complete examination. Limited examinations for reoccurring indications may be performed as noted.  +-----------+--------+-----+--------+----------+--------+  RIGHT       PSV cm/s Ratio Stenosis Waveform   Comments  +-----------+--------+-----+--------+----------+--------+  CFA Distal  119                     triphasic            +-----------+--------+-----+--------+----------+--------+  DFA         77                      triphasic            +-----------+--------+-----+--------+----------+--------+  SFA Prox    51                      monophasic           +-----------+--------+-----+--------+----------+--------+  SFA Mid     38                      monophasic           +-----------+--------+-----+--------+----------+--------+  SFA Distal  48                      monophasic           +-----------+--------+-----+--------+----------+--------+  POP Distal  48                      monophasic           +-----------+--------+-----+--------+----------+--------+  ATA Distal  67                      monophasic           +-----------+--------+-----+--------+----------+--------+  PTA Distal  74                      monophasic           +-----------+--------+-----+--------+----------+--------+  PERO Distal 32                      monophasic           +-----------+--------+-----+--------+----------+--------+  +-----------+--------+-----+--------+----------+--------+  LEFT        PSV  cm/s Ratio Stenosis Waveform   Comments  +-----------+--------+-----+--------+----------+--------+  CFA Distal  118                     triphasic            +-----------+--------+-----+--------+----------+--------+  DFA         60                      triphasic            +-----------+--------+-----+--------+----------+--------+  SFA Prox    109                     monophasic           +-----------+--------+-----+--------+----------+--------+  SFA Mid     143                     monophasic           +-----------+--------+-----+--------+----------+--------+  SFA Distal  63                      monophasic           +-----------+--------+-----+--------+----------+--------+  POP Distal  74                      monophasic           +-----------+--------+-----+--------+----------+--------+  ATA Distal  16                      monophasic           +-----------+--------+-----+--------+----------+--------+  PTA Distal  94                      monophasic           +-----------+--------+-----+--------+----------+--------+  PERO Distal 15                      monophasic           +-----------+--------+-----+--------+----------+--------+  Summary: Right: Imaging and Waveforms obtained throughout in the Right Lower Extremity.Monophasic Waveforms seen predominantly in the Right Lower Extremity. Left: Imaging and Waveforms obtained throughout in the Left Lower Extremity. Monophasic Waveforms seen predominantly in the Left Lower Extremity.  See table(s) above for measurements and observations. Electronically signed by Hortencia Pilar MD on 08/27/2021 at 5:02:54 PM.    Final     EKG: Possible paced rhythm wide-complex right bundle branch block left anterior hemiblock rate of 70  ASSESSMENT AND PLAN:  Acute on chronic diastolic congestive heart failure Hypoxemia Elevated BNP suggestive of heart failure Paroxysmal atrial fibrillation Hyperlipidemia Neuropathy Leg pain Smoking COPD/asthma Chronic pain Permanent  pacemaker . Plan Agreed admit to telemetry follow-up EKGs troponins BMP Recommend diuretic therapy intravenous Lasix to help with acute on chronic diastolic heart failure Continue telemetry to follow-up paced rhythm Maintain Eliquis for anticoagulation for atrial fibrillation Supplemental oxygen as necessary for hypoxemia Inhalers for shortness of breath COPD Statin therapy for hyperlipidemia management and control Continue aggressive medical therapy no invasive procedures recommended at this stage . Plan  Signed: Yolonda Kida MD, 09/19/2021, 1:01 PM

## 2021-09-20 ENCOUNTER — Inpatient Hospital Stay: Payer: Medicare PPO

## 2021-09-20 ENCOUNTER — Encounter: Payer: Self-pay | Admitting: Family Medicine

## 2021-09-20 DIAGNOSIS — G894 Chronic pain syndrome: Secondary | ICD-10-CM

## 2021-09-20 DIAGNOSIS — J441 Chronic obstructive pulmonary disease with (acute) exacerbation: Secondary | ICD-10-CM

## 2021-09-20 DIAGNOSIS — J449 Chronic obstructive pulmonary disease, unspecified: Secondary | ICD-10-CM

## 2021-09-20 DIAGNOSIS — K219 Gastro-esophageal reflux disease without esophagitis: Secondary | ICD-10-CM

## 2021-09-20 DIAGNOSIS — I739 Peripheral vascular disease, unspecified: Secondary | ICD-10-CM

## 2021-09-20 DIAGNOSIS — Z7901 Long term (current) use of anticoagulants: Secondary | ICD-10-CM

## 2021-09-20 DIAGNOSIS — N183 Chronic kidney disease, stage 3 unspecified: Secondary | ICD-10-CM

## 2021-09-20 DIAGNOSIS — I5033 Acute on chronic diastolic (congestive) heart failure: Secondary | ICD-10-CM | POA: Diagnosis not present

## 2021-09-20 DIAGNOSIS — Z79891 Long term (current) use of opiate analgesic: Secondary | ICD-10-CM

## 2021-09-20 LAB — BASIC METABOLIC PANEL
Anion gap: 9 (ref 5–15)
BUN: 31 mg/dL — ABNORMAL HIGH (ref 8–23)
CO2: 28 mmol/L (ref 22–32)
Calcium: 9.2 mg/dL (ref 8.9–10.3)
Chloride: 100 mmol/L (ref 98–111)
Creatinine, Ser: 1.34 mg/dL — ABNORMAL HIGH (ref 0.44–1.00)
GFR, Estimated: 38 mL/min — ABNORMAL LOW (ref >60)
Glucose, Bld: 91 mg/dL (ref 70–99)
Potassium: 5.5 mmol/L — ABNORMAL HIGH (ref 3.5–5.1)
Sodium: 137 mmol/L (ref 135–145)

## 2021-09-20 LAB — MAGNESIUM: Magnesium: 1.9 mg/dL (ref 1.7–2.4)

## 2021-09-20 LAB — BRAIN NATRIURETIC PEPTIDE: B Natriuretic Peptide: 1234.8 pg/mL — ABNORMAL HIGH (ref 0.0–100.0)

## 2021-09-20 MED ORDER — PREDNISONE 20 MG PO TABS
40.0000 mg | ORAL_TABLET | Freq: Every day | ORAL | Status: DC
  Administered 2021-09-21 – 2021-09-22 (×2): 40 mg via ORAL
  Filled 2021-09-20 (×2): qty 2

## 2021-09-20 MED ORDER — PANTOPRAZOLE SODIUM 40 MG PO TBEC
40.0000 mg | DELAYED_RELEASE_TABLET | Freq: Every day | ORAL | Status: DC
  Administered 2021-09-20 – 2021-09-22 (×3): 40 mg via ORAL
  Filled 2021-09-20 (×3): qty 1

## 2021-09-20 MED ORDER — METHYLPREDNISOLONE SODIUM SUCC 125 MG IJ SOLR
125.0000 mg | Freq: Once | INTRAMUSCULAR | Status: AC
  Administered 2021-09-20: 125 mg via INTRAVENOUS
  Filled 2021-09-20: qty 2

## 2021-09-20 MED ORDER — IPRATROPIUM-ALBUTEROL 0.5-2.5 (3) MG/3ML IN SOLN
3.0000 mL | Freq: Once | RESPIRATORY_TRACT | Status: AC
  Administered 2021-09-20: 3 mL via RESPIRATORY_TRACT
  Filled 2021-09-20: qty 3

## 2021-09-20 NOTE — ED Notes (Signed)
Sleeping, arousable to voice, sleepy, interactive, calm, NAD, no s/sx of pain. Meds and n eb given. Returned to [employer], O2 and monitor.

## 2021-09-20 NOTE — Evaluation (Signed)
Physical Therapy Evaluation Patient Details Name: Kristin [name] MRN: [id number] DOB: 03/24/1930 Today's Date: 09/20/2021  History of Present Illness  Pt is an 88 y/o F admitted on 09/18/21 after presenting to the ED with c/o acute onset of worsening dyspnea with associated dry cough & DOE & BLE edema over the past few days. Pt gained ~23 lbs in a month. Pt is being treated for acute on chronic diastolic CHF & COPD with acute exacerbation. PMH: a-fib, diastolic CHF, CKD, COPD, gout, hep C, dyslipidemia, HTN, breast CA s/p R lumpectomy, chronic hypoxic respiratory failure on O2, pacemaker, chronic pain syndrome  Clinical Impression  Pt seen for PT evaluation with pt reporting she was mod I with SPC & denies falls, living at home with her son who is with her until he goes to work at [employer]. On this date pt demonstrates generalized deconditioning & unsteadiness on feet. Pt performs bed mobility with mod I but requires min assist for gait with HHA to simulate cane. Pt would benefit from ongoing acute PT needs to focus on balance, endurance, and determine if pt is safe to resume using SPC or would benefit from RW.       Recommendations for follow up therapy are one component of a multi-disciplinary discharge planning process, led by the attending physician.  Recommendations may be updated based on patient status, additional functional criteria and insurance authorization.  Follow Up Recommendations Home health PT    Assistance Recommended at Discharge Intermittent Supervision/Assistance  Patient can return home with the following  A little help with walking and/or transfers;A little help with bathing/dressing/bathroom;Assistance with cooking/housework;Assist for transportation;Help with stairs or ramp for entrance    Equipment Recommendations Rolling walker (2 wheels)  Recommendations for Other Services       Functional Status Assessment Patient has had a recent decline in their functional status and  demonstrates the ability to make significant improvements in function in a reasonable and predictable amount of time.     Precautions / Restrictions Precautions Precautions: Fall Restrictions Weight Bearing Restrictions: No      Mobility  Bed Mobility Overal bed mobility: Needs Assistance Bed Mobility: Supine to Sit, Sit to Supine     Supine to sit: Modified independent (Device/Increase time), HOB elevated Sit to supine: Modified independent (Device/Increase time), HOB elevated        Transfers Overall transfer level: Needs assistance Equipment used: 1 person hand held assist Transfers: Sit to/from Stand Sit to Stand: Min guard                Ambulation/Gait Ambulation/Gait assistance: Herbalist (Feet): 65 Feet Assistive device: 1 person hand held assist Gait Pattern/deviations: Decreased step length - right, Decreased step length - left, Decreased stride length Gait velocity: decreased        Stairs            Wheelchair Mobility    Modified Rankin (Stroke Patients Only)       Balance Overall balance assessment: Needs assistance Sitting-balance support: Feet supported Sitting balance-Leahy Scale: Good     Standing balance support: During functional activity, Single extremity supported Standing balance-Leahy Scale: Poor                               Pertinent Vitals/Pain Pain Assessment Pain Assessment: Faces Faces Pain Scale: Hurts even more Pain Location: BLE Pain Descriptors / Indicators: Discomfort Pain Intervention(s): Monitored during session  Home Living Family/patient expects to be discharged to:: Private residence Living Arrangements: Children Available Help at Discharge: Family;Available PRN/intermittently (son present with her until he leaves for work at [employer] then granddaughter is next door) Type of Home: House Home Access: Level entry       Home Layout: Able to live on main level with  bedroom/bathroom Home Equipment: [place]aman - single point      Prior Function Prior Level of Function : Independent/Modified Independent             Mobility Comments: ambulatory with SPC       Hand Dominance        Extremity/Trunk Assessment   Upper Extremity Assessment Upper Extremity Assessment: Generalized weakness    Lower Extremity Assessment Lower Extremity Assessment: Generalized weakness       Communication   Communication: No difficulties  Cognition Arousal/Alertness: Awake/alert Behavior During Therapy: WFL for tasks assessed/performed Overall Cognitive Status: Within Functional Limits for tasks assessed                                          General Comments General comments (skin integrity, edema, etc.): Pt on 2L/min, poor pleth readings throughout session but SpO2 92% after walking, pt endorses slight SOB with PT educating her on pursed lip breathing.    Exercises     Assessment/Plan    PT Assessment Patient needs continued PT services  PT Problem List Decreased strength;Cardiopulmonary status limiting activity;Decreased activity tolerance;Decreased balance;Decreased mobility       PT Treatment Interventions DME instruction;Therapeutic exercise;Gait training;Balance training;Functional mobility training;Therapeutic activities;Patient/family education;Neuromuscular re-education;Stair training    PT Goals (Current goals can be found in the Care Plan section)  Acute Rehab PT Goals Patient Stated Goal: get a room upstairs PT Goal Formulation: With patient Time For Goal Achievement: 10/05/21 Potential to Achieve Goals: Good    Frequency Min 2X/week     Co-evaluation               AM-PAC PT "6 Clicks" Mobility  Outcome Measure Help needed turning from your back to your side while in a flat bed without using bedrails?: None Help needed moving from lying on your back to sitting on the side of a flat bed without using  bedrails?: None Help needed moving to and from a bed to a chair (including a wheelchair)?: A Little Help needed standing up from a chair using your arms (e.g., wheelchair or bedside chair)?: A Little Help needed to walk in hospital room?: A Little Help needed climbing 3-5 steps with a railing? : A Little 6 Click Score: 20    End of Session Equipment Utilized During Treatment: Oxygen Activity Tolerance: Patient tolerated treatment well Patient left: in bed Nurse Communication: Mobility status (O2) PT Visit Diagnosis: Muscle weakness (generalized) (M62.81);Unsteadiness on feet (R26.81)    Time: 1434-1450 PT Time Calculation (min) (ACUTE ONLY): 16 min   Charges:   PT Evaluation $PT Eval Moderate Complexity: [place], PT, DPT 09/20/21, 3:04 PM   [name] 09/20/2021, 3:02 PM

## 2021-09-20 NOTE — ED Notes (Signed)
More awake. Mentions some back pain. Set up for/ assisted with breakfast

## 2021-09-20 NOTE — Assessment & Plan Note (Addendum)
Chronic.  Continue pain regimen.

## 2021-09-20 NOTE — Assessment & Plan Note (Addendum)
Presented with dyspnea, significant lower extremity pitting edema, elevated BNP and chest x-ray with vascular congestion. Echo in October 2021 showed EF 60 to 03%, grade 1 diastolic dysfunction. Started on IV diuresis and clinically improved. Euvolemic with improved dyspnea and edema.  -- Cardiology consulted - outpatient f/u in 1-2 weeks -- Treated with IV diuresis --Resumed PO diuretic at discharge  -- Monitor renal function and electrolytes in follow up -- Continue home daily weights for monitoring volume status

## 2021-09-20 NOTE — ED Notes (Signed)
Incontinent of urine in bed. Purewick failed. Then up to void. Unmeasurable. Pure wick returned per request.

## 2021-09-20 NOTE — Progress Notes (Signed)
[facility] Cardiology    SUBJECTIVE: Patient complains of mild shortness of breath but chronic recurrent leg pain denies any palpitation tachycardia no generalized weakness no significant chest pain   Vitals:   09/20/21 0830 09/20/21 0845 09/20/21 0900 09/20/21 0915  BP: 127/71 (!) 149/86 (!) 149/75 136/78  Pulse: 61 61 65 60  Resp:      Temp:      TempSrc:      SpO2: 99% 100% 99% 100%  Weight:      Height:         Intake/Output Summary (Last 24 hours) at 09/20/2021 1011 Last data filed at 09/19/2021 2100 Gross per 24 hour  Intake --  Output 1600 ml  Net -1600 ml      PHYSICAL EXAM  General: Well developed, well nourished, in no acute distress HEENT:  Normocephalic and atramatic Neck:  No JVD.  Lungs: Clear bilaterally to auscultation and percussion. Heart: HRRR . Normal S1 and S2 without gallops or murmurs.  Abdomen: Bowel sounds are positive, abdomen soft and non-tender  Msk:  Back normal, normal gait. Normal strength and tone for age. Extremities: No clubbing, cyanosis or edema.   Neuro: Alert and oriented X 3. Psych:  Good affect, responds appropriately   LABS: Basic Metabolic Panel: Recent Labs    09/19/21 0705 09/20/21 0713  NA 136 137  K 4.2 5.5*  CL 102 100  CO2 27 28  GLUCOSE 78 91  BUN 28* 31*  CREATININE 1.36* 1.34*  CALCIUM 8.7* 9.2  MG  --  1.9   Liver Function Tests: No results for input(s): AST, ALT, ALKPHOS, BILITOT, PROT, ALBUMIN in the last 72 hours. No results for input(s): LIPASE, AMYLASE in the last 72 hours. CBC: Recent Labs    09/18/21 1647 09/19/21 0705  WBC 5.3   5.7 5.5  NEUTROABS 2.6  --   HGB 11.3*   11.2* 10.7*  HCT 37.6   37.2 34.3*  MCV 95.9   95.6 91.0  PLT 204   209 198   Cardiac Enzymes: Recent Labs    09/18/21 2256  CKTOTAL 173   BNP: Invalid input(s): POCBNP D-Dimer: No results for input(s): DDIMER in the last 72 hours. Hemoglobin A1C: No results for input(s): HGBA1C in the last 72 hours. Fasting Lipid  Panel: No results for input(s): CHOL, HDL, LDLCALC, TRIG, CHOLHDL, LDLDIRECT in the last 72 hours. Thyroid Function Tests: No results for input(s): TSH, T4TOTAL, T3FREE, THYROIDAB in the last 72 hours.  Invalid input(s): FREET3 Anemia Panel: No results for input(s): VITAMINB12, FOLATE, FERRITIN, TIBC, IRON, RETICCTPCT in the last 72 hours.  US Venous Img Lower Bilateral  Result Date: 09/19/2021 CLINICAL DATA:  Bilateral leg pain and swelling, initial encounter EXAM: BILATERAL LOWER EXTREMITY VENOUS DOPPLER ULTRASOUND TECHNIQUE: Gray-scale sonography with graded compression, as well as color Doppler and duplex ultrasound were performed to evaluate the lower extremity deep venous systems from the level of the common femoral vein and including the common femoral, femoral, profunda femoral, popliteal and calf veins including the posterior tibial, peroneal and gastrocnemius veins when visible. The superficial great saphenous vein was also interrogated. Spectral Doppler was utilized to evaluate flow at rest and with distal augmentation maneuvers in the common femoral, femoral and popliteal veins. COMPARISON:  None. FINDINGS: RIGHT LOWER EXTREMITY Common Femoral Vein: No evidence of thrombus. Normal compressibility, respiratory phasicity and response to augmentation. Saphenofemoral Junction: No evidence of thrombus. Normal compressibility and flow on color Doppler imaging. Profunda Femoral Vein: No evidence of  thrombus. Normal compressibility and flow on color Doppler imaging. Femoral Vein: No evidence of thrombus. Normal compressibility, respiratory phasicity and response to augmentation. Popliteal Vein: No evidence of thrombus. Normal compressibility, respiratory phasicity and response to augmentation. Calf Veins: No evidence of thrombus. Normal compressibility and flow on color Doppler imaging. Superficial Great Saphenous Vein: No evidence of thrombus. Normal compressibility. Venous Reflux:  None. Other  Findings:  Soft tissue edema is noted. LEFT LOWER EXTREMITY Common Femoral Vein: No evidence of thrombus. Normal compressibility, respiratory phasicity and response to augmentation. Saphenofemoral Junction: No evidence of thrombus. Normal compressibility and flow on color Doppler imaging. Profunda Femoral Vein: No evidence of thrombus. Normal compressibility and flow on color Doppler imaging. Femoral Vein: No evidence of thrombus. Normal compressibility, respiratory phasicity and response to augmentation. Popliteal Vein: No evidence of thrombus. Normal compressibility, respiratory phasicity and response to augmentation. Calf Veins: No evidence of thrombus. Normal compressibility and flow on color Doppler imaging. Superficial Great Saphenous Vein: No evidence of thrombus. Normal compressibility. Venous Reflux:  None. Other Findings:  Soft tissue edema is noted. IMPRESSION: No evidence of deep venous thrombosis in either lower extremity. Electronically Signed   By: [name] M.D.   On: 09/19/2021 00:05   DG Chest Port 1 View  Result Date: 09/20/2021 CLINICAL DATA:  Audible wheezing.  Leg swelling and redness EXAM: PORTABLE CHEST 1 VIEW COMPARISON:  Yesterday FINDINGS: Diffuse interstitial opacity, cardiomegaly, and vascular pedicle widening. Enlarged hilar vessels. A few Kerley lines are present. There is emphysema with fibrotic features by 2020 chest CT. IMPRESSION: Chronic lung disease which could obscure CHF. There is definite cardiomegaly and central vascular congestion. Electronically Signed   By: Jorje Guild M.D.   On: 09/20/2021 07:19   DG Chest Port 1 View  Result Date: 09/19/2021 CLINICAL DATA:  Shortness of breath EXAM: PORTABLE CHEST 1 VIEW COMPARISON:  09/17/2021 FINDINGS: Cardiac shadow is enlarged. Pacing device is again identified. Lungs are well aerated bilaterally. Increasing interstitial markings are noted bilaterally consistent with developing edema. Persistent but improved airspace  opacity is noted in right lung base. Postsurgical changes are noted. IMPRESSION: Increasing interstitial change consistent with edema. Improving but persistent right basilar airspace opacity. Electronically Signed   By: [name] M.D.   On: 09/19/2021 03:53     Echo preserved left ventricular function on last echo in 2021  TELEMETRY: Wide-complex rhythm right bundle branch block left anterior hemiblock A. fib versus junctional rhythm rate of around 70:  ASSESSMENT AND PLAN:  Principal Problem:   Acute on chronic diastolic CHF (congestive heart failure) (HCC) Active Problems:   Chronic kidney disease (CKD), stage III (moderate) (HCC)   Chronic pain syndrome   Long term current use of opiate analgesic   GERD (gastroesophageal reflux disease)   Chronic anticoagulation (Eliquis)   PVD (peripheral vascular disease) (HCC)   COPD with acute exacerbation ([place]) History of smoking  Plan Continue IV diuretic therapy for diastolic congestive heart failure Consider nephrology input for renal insufficiency Reflux therapy with PPI Anticoagulation with Eliquis for paroxysmal atrial fibrillation Statin therapy for hyperlipidemia Agree with gabapentin for neuropathy Chronic leg pain recommend pain management Recommend inhalers for COPD asthma Supplemental oxygen as necessary for hypoxemia Elevated BNP consistent with possible heart failure recommend diuretic therapy with IV Lasix  Yolonda Kida, MD 09/20/2021 10:11 AM

## 2021-09-20 NOTE — Assessment & Plan Note (Addendum)
Patient has expiratory wheezing on exam and history of COPD on chronic oxygen therapy.  Started on IV steroid on 1/29. Today, patient is without wheezing with improved air movement.   Clinically improved and stable for discharge.  --Prednisone at d/c --Continue Breztri  --Albuterol nebs as needed

## 2021-09-20 NOTE — Progress Notes (Signed)
PROGRESS NOTE    Kristin [name]  FYB:[id number] DOB: Mar 29, 1930 DOA: 09/18/2021 PCP: Olin Hauser, DO  Brief Narrative:  Kristin [name] is a 88 y.o. African-American female with medical history significant for atrial fibrillation, diastolic CHF, CKD, COPD, gout and hepatitis C as well as dyslipidemia and hypertension, who presented to the ER with acute onset of worsening dyspnea with associated dry cough and dyspnea on exertion as well as lower extremity edema over the last few days.  She gained about 23 pounds within a month.  She denies any fever or chills.  No chest pain or palpitations.  No dysuria, oliguria or hematuria or flank pain.  No nausea or vomiting or abdominal pain. ED Course: When she came to the ER vital signs were within normal except for pulse oximetry that was down to 75% on room air and 95% on 2 L of O2 by nasal cannula..  Labs revealed a BUN of 32 and creatinine 1.44 lower than previous levels and BNP was elevated at 1100 zero 7.8 with a sensitive troponin of 25 and CK1 73.  Lactic acid was 2.6 and later 1.5 and procalcitonin 0.15.  CBC showed mild anemia.  Influenza antigens and COVID-19 PCR came back negative.  Principal Problem:   Acute on chronic diastolic CHF (congestive heart failure) (HCC) Active Problems:   COPD with acute exacerbation (HCC)   Chronic kidney disease (CKD), stage III (moderate) (HCC)   Chronic pain syndrome   Long term current use of opiate analgesic   GERD (gastroesophageal reflux disease)   Chronic anticoagulation (Eliquis)   PVD (peripheral vascular disease) (HCC)   Assessment & Plan:   Acute on chronic diastolic CHF (congestive heart failure) ([place]) admitted yesterday for acute on chronic diastolic heart failure.  Patient room air this morning.  has diuresed well.  She is wheezing this morning.  Repeat chest x-ray still shows some mild pulmonary edema.  Patient is wheezing.  This also could be COPD exacerbation.  We will start IV  Solu-Medrol.  Repeat BMP.  COPD with acute exacerbation ([place]) Patient wheezing significantly today.  She is not requiring any oxygen.  She still feels short of breath.  We will start Solu-Medrol today.  Transition over to p.o. prednisone today.  Continue DuoNebs every 6 hours as needed.  Chronic kidney disease (CKD), stage III (moderate) (HCC) Monitor serum creatinine during diuresis.  Chronic pain syndrome Chronic.  Continue opiates.  Long term current use of opiate analgesic Continue opiates.  GERD (gastroesophageal reflux disease) Stable.  Continue PPI.  Chronic anticoagulation (Eliquis) Continue Eliquis twice daily.  PVD (peripheral vascular disease) (HCC) Stable.  Patient had arthrectomy done this past month.  Lower extremity ultrasound negative for DVT.    DVT prophylaxis: Eliquis   Code Status: Full Code Family Communication: no family at bedside Disposition Plan: return home  Consultants:  none  Procedures:  none  Antimicrobials:  none    Subjective: Overnight, patient diuresed well.  Is on room air this morning.  Still feeling short of breath.  Wheezing this morning.  Objective: Vitals:   09/19/21 2130 09/19/21 2205 09/20/21 0100 09/20/21 0400  BP: 123/84 (!) 118/91 126/64 123/78  Pulse: 61 61 61 63  Resp:  20 20 18   Temp:   98.6 F (37 C) 98.7 F (37.1 C)  TempSrc:   Oral Oral  SpO2: 98% 98% 97% 97%  Weight:      Height:        Intake/Output Summary (Last 24  hours) at 09/20/2021 0743 Last data filed at 09/19/2021 2100 Gross per 24 hour  Intake --  Output 2250 ml  Net -2250 ml   Filed Weights   09/18/21 1629  Weight: 78 kg    Examination:  Physical Exam Vitals and nursing note reviewed.  Constitutional:      General: She is not in acute distress.    Appearance: Normal appearance. She is normal weight. She is not ill-appearing, toxic-appearing or diaphoretic.  HENT:     Head: Normocephalic and atraumatic.     Nose: Nose normal. No  rhinorrhea.  Eyes:     General: No scleral icterus. Cardiovascular:     Rate and Rhythm: Normal rate and regular rhythm.  Pulmonary:     Effort: Respiratory distress present.     Breath sounds: Stridor present. Wheezing present.  Abdominal:     General: Abdomen is flat. Bowel sounds are normal. There is no distension.     Palpations: Abdomen is soft.     Tenderness: There is no abdominal tenderness.  Musculoskeletal:     Right lower leg: Edema present.     Left lower leg: No edema.  Skin:    General: Skin is warm and dry.     Capillary Refill: Capillary refill takes less than 2 seconds.  Neurological:     General: No focal deficit present.     Mental Status: She is alert and oriented to person, place, and time.    Data Reviewed: I have personally reviewed following labs and imaging studies  CBC: Recent Labs  Lab 09/17/21 0951 09/18/21 1647 09/19/21 0705  WBC 6.4 5.3   5.7 5.5  NEUTROABS 2,675 2.6  --   HGB 11.2* 11.3*   11.2* 10.7*  HCT 36.2 37.6   37.2 34.3*  MCV 93.1 95.9   95.6 91.0  PLT 179 204   209 678   Basic Metabolic Panel: Recent Labs  Lab 09/17/21 0951 09/18/21 1647 09/19/21 0705  NA 140 137 136  K 5.0 4.4 4.2  CL 104 101 102  CO2 30 24 27   GLUCOSE 74 114* 78  BUN 32* 32* 28*  CREATININE 1.62* 1.44* 1.36*  CALCIUM 9.4 9.0 8.7*   GFR: Estimated Creatinine Clearance: 30.2 mL/min (A) (by C-G formula based on SCr of 1.36 mg/dL (H)). Liver Function Tests: No results for input(s): AST, ALT, ALKPHOS, BILITOT, PROT, ALBUMIN in the last 168 hours. No results for input(s): LIPASE, AMYLASE in the last 168 hours. No results for input(s): AMMONIA in the last 168 hours. Coagulation Profile: No results for input(s): INR, PROTIME in the last 168 hours. Cardiac Enzymes: Recent Labs  Lab 09/18/21 2256  CKTOTAL 173   BNP (last 3 results) No results for input(s): PROBNP in the last 8760 hours. HbA1C: No results for input(s): HGBA1C in the last 72  hours. CBG: No results for input(s): GLUCAP in the last 168 hours. Lipid Profile: No results for input(s): CHOL, HDL, LDLCALC, TRIG, CHOLHDL, LDLDIRECT in the last 72 hours. Thyroid Function Tests: No results for input(s): TSH, T4TOTAL, FREET4, T3FREE, THYROIDAB in the last 72 hours. Anemia Panel: No results for input(s): VITAMINB12, FOLATE, FERRITIN, TIBC, IRON, RETICCTPCT in the last 72 hours. Sepsis Labs: Recent Labs  Lab 09/18/21 1647 09/18/21 2219 09/18/21 2256  PROCALCITON  --   --  0.15  LATICACIDVEN 2.6* 1.5  --     Recent Results (from the past 240 hour(s))  Resp Panel by RT-PCR (Flu A&B, Covid) Nasopharyngeal Swab  Status: None   Collection Time: 09/19/21  3:22 AM   Specimen: Nasopharyngeal Swab; Nasopharyngeal(NP) swabs in vial transport medium  Result Value Ref Range Status   SARS Coronavirus 2 by RT PCR NEGATIVE NEGATIVE Final    Comment: (NOTE) SARS-CoV-2 target nucleic acids are NOT DETECTED.  The SARS-CoV-2 RNA is generally detectable in upper respiratory specimens during the acute phase of infection. The lowest concentration of SARS-CoV-2 viral copies this assay can detect is 138 copies/mL. A negative result does not preclude SARS-Cov-2 infection and should not be used as the sole basis for treatment or other patient management decisions. A negative result may occur with  improper specimen collection/handling, submission of specimen other than nasopharyngeal swab, presence of viral mutation(s) within the areas targeted by this assay, and inadequate number of viral copies(<138 copies/mL). A negative result must be combined with clinical observations, patient history, and epidemiological information. The expected result is Negative.  Fact Sheet for Patients:  EntrepreneurPulse.com.au  Fact Sheet for Healthcare Providers:  IncredibleEmployment.be  This test is no t yet approved or cleared by the [place] FDA and   has been authorized for detection and/or diagnosis of SARS-CoV-2 by FDA under an Emergency Use Authorization (EUA). This EUA will remain  in effect (meaning this test can be used) for the duration of the COVID-19 declaration under Section 564(b)(1) of the Act, 21 U.S.C.section 360bbb-3(b)(1), unless the authorization is terminated  or revoked sooner.       Influenza A by PCR NEGATIVE NEGATIVE Final   Influenza B by PCR NEGATIVE NEGATIVE Final    Comment: (NOTE) The Xpert Xpress SARS-CoV-2/FLU/RSV plus assay is intended as an aid in the diagnosis of influenza from Nasopharyngeal swab specimens and should not be used as a sole basis for treatment. Nasal washings and aspirates are unacceptable for Xpert Xpress SARS-CoV-2/FLU/RSV testing.  Fact Sheet for Patients: EntrepreneurPulse.com.au  Fact Sheet for Healthcare Providers: IncredibleEmployment.be  This test is not yet approved or cleared by the [place] FDA and has been authorized for detection and/or diagnosis of SARS-CoV-2 by FDA under an Emergency Use Authorization (EUA). This EUA will remain in effect (meaning this test can be used) for the duration of the COVID-19 declaration under Section 564(b)(1) of the Act, 21 U.S.C. section 360bbb-3(b)(1), unless the authorization is terminated or revoked.  Performed at [facility], [address], [place], [place] [address]      Radiology Studies: US Venous Img Lower Bilateral  Result Date: 09/19/2021 CLINICAL DATA:  Bilateral leg pain and swelling, initial encounter EXAM: BILATERAL LOWER EXTREMITY VENOUS DOPPLER ULTRASOUND TECHNIQUE: Gray-scale sonography with graded compression, as well as color Doppler and duplex ultrasound were performed to evaluate the lower extremity deep venous systems from the level of the common femoral vein and including the common femoral, femoral, profunda femoral, popliteal and calf veins including the  posterior tibial, peroneal and gastrocnemius veins when visible. The superficial great saphenous vein was also interrogated. Spectral Doppler was utilized to evaluate flow at rest and with distal augmentation maneuvers in the common femoral, femoral and popliteal veins. COMPARISON:  None. FINDINGS: RIGHT LOWER EXTREMITY Common Femoral Vein: No evidence of thrombus. Normal compressibility, respiratory phasicity and response to augmentation. Saphenofemoral Junction: No evidence of thrombus. Normal compressibility and flow on color Doppler imaging. Profunda Femoral Vein: No evidence of thrombus. Normal compressibility and flow on color Doppler imaging. Femoral Vein: No evidence of thrombus. Normal compressibility, respiratory phasicity and response to augmentation. Popliteal Vein: No evidence of thrombus. Normal  compressibility, respiratory phasicity and response to augmentation. Calf Veins: No evidence of thrombus. Normal compressibility and flow on color Doppler imaging. Superficial Great Saphenous Vein: No evidence of thrombus. Normal compressibility. Venous Reflux:  None. Other Findings:  Soft tissue edema is noted. LEFT LOWER EXTREMITY Common Femoral Vein: No evidence of thrombus. Normal compressibility, respiratory phasicity and response to augmentation. Saphenofemoral Junction: No evidence of thrombus. Normal compressibility and flow on color Doppler imaging. Profunda Femoral Vein: No evidence of thrombus. Normal compressibility and flow on color Doppler imaging. Femoral Vein: No evidence of thrombus. Normal compressibility, respiratory phasicity and response to augmentation. Popliteal Vein: No evidence of thrombus. Normal compressibility, respiratory phasicity and response to augmentation. Calf Veins: No evidence of thrombus. Normal compressibility and flow on color Doppler imaging. Superficial Great Saphenous Vein: No evidence of thrombus. Normal compressibility. Venous Reflux:  None. Other Findings:  Soft  tissue edema is noted. IMPRESSION: No evidence of deep venous thrombosis in either lower extremity. Electronically Signed   By: [name] M.D.   On: 09/19/2021 00:05   DG Chest Port 1 View  Result Date: 09/20/2021 CLINICAL DATA:  Audible wheezing.  Leg swelling and redness EXAM: PORTABLE CHEST 1 VIEW COMPARISON:  Yesterday FINDINGS: Diffuse interstitial opacity, cardiomegaly, and vascular pedicle widening. Enlarged hilar vessels. A few Kerley lines are present. There is emphysema with fibrotic features by 2020 chest CT. IMPRESSION: Chronic lung disease which could obscure CHF. There is definite cardiomegaly and central vascular congestion. Electronically Signed   By: Jorje Guild M.D.   On: 09/20/2021 07:19   DG Chest Port 1 View  Result Date: 09/19/2021 CLINICAL DATA:  Shortness of breath EXAM: PORTABLE CHEST 1 VIEW COMPARISON:  09/17/2021 FINDINGS: Cardiac shadow is enlarged. Pacing device is again identified. Lungs are well aerated bilaterally. Increasing interstitial markings are noted bilaterally consistent with developing edema. Persistent but improved airspace opacity is noted in right lung base. Postsurgical changes are noted. IMPRESSION: Increasing interstitial change consistent with edema. Improving but persistent right basilar airspace opacity. Electronically Signed   By: [name] M.D.   On: 09/19/2021 03:53       Scheduled Meds:  apixaban  2.5 mg Oral BID   aspirin EC  81 mg Oral Daily   atorvastatin  20 mg Oral Daily   cholecalciferol  2,000 Units Oral Daily   furosemide  40 mg Intravenous Q12H   gabapentin  300 mg Oral QHS   ipratropium-albuterol  3 mL Nebulization Once   loratadine  5 mg Oral QPM   melatonin  10 mg Oral QHS   methylPREDNISolone (SOLU-MEDROL) injection  125 mg Intravenous Once   oxyCODONE  10 mg Oral BID   pantoprazole  40 mg Oral Daily   [START ON 09/21/2021] predniSONE  40 mg Oral Q breakfast   rOPINIRole  4 mg Oral QHS   Continuous  Infusions:   LOS: 1 day    Time spent: 35 mins    Kristopher Oppenheim, DO  [facility]  09/20/2021, 7:43 AM

## 2021-09-20 NOTE — Assessment & Plan Note (Signed)
Monitor serum creatinine during diuresis.

## 2021-09-20 NOTE — ED Notes (Signed)
Pt eating, pending transport arrival to go to floor, PT at [facility] for ambulation, Urine emptied.

## 2021-09-20 NOTE — Assessment & Plan Note (Signed)
Stable.  Patient had arthrectomy done this past month.  Lower extremity ultrasound negative for DVT.

## 2021-09-20 NOTE — Subjective & Objective (Signed)
Kristin [name] is a 88 y.o. African-American female with medical history significant for atrial fibrillation, diastolic CHF, CKD, COPD, gout and hepatitis C as well as dyslipidemia and hypertension, who presented to the ER with acute onset of worsening dyspnea with associated dry cough and dyspnea on exertion as well as lower extremity edema over the last few days.  She gained about 23 pounds within a month.  She denies any fever or chills.  No chest pain or palpitations.  No dysuria, oliguria or hematuria or flank pain.  No nausea or vomiting or abdominal pain. ED Course: When she came to the ER vital signs were within normal except for pulse oximetry that was down to 75% on room air and 95% on 2 L of O2 by nasal cannula..  Labs revealed a BUN of 32 and creatinine 1.44 lower than previous levels and BNP was elevated at 1100 zero 7.8 with a sensitive troponin of 25 and CK1 73.  Lactic acid was 2.6 and later 1.5 and procalcitonin 0.15.  CBC showed mild anemia.  Influenza antigens and COVID-19 PCR came back negative.

## 2021-09-20 NOTE — Assessment & Plan Note (Signed)
Stable.  Continue PPI. 

## 2021-09-20 NOTE — ED Notes (Signed)
Up with walker to b/s-room toilet

## 2021-09-20 NOTE — Assessment & Plan Note (Signed)
Continue opiates.

## 2021-09-20 NOTE — Assessment & Plan Note (Signed)
Continue Eliquis twice daily.

## 2021-09-21 ENCOUNTER — Telehealth: Payer: Self-pay | Admitting: Family Medicine

## 2021-09-21 DIAGNOSIS — I5033 Acute on chronic diastolic (congestive) heart failure: Secondary | ICD-10-CM | POA: Diagnosis not present

## 2021-09-21 MED ORDER — MOMETASONE FURO-FORMOTEROL FUM 200-5 MCG/ACT IN AERO
2.0000 | INHALATION_SPRAY | Freq: Two times a day (BID) | RESPIRATORY_TRACT | Status: DC
  Administered 2021-09-21 – 2021-09-22 (×2): 2 via RESPIRATORY_TRACT
  Filled 2021-09-21 (×2): qty 8.8

## 2021-09-21 MED ORDER — BUDESON-GLYCOPYRROL-FORMOTEROL 160-9-4.8 MCG/ACT IN AERO: 2.0000 | INHALATION_SPRAY | Freq: Two times a day (BID) | RESPIRATORY_TRACT | Status: DC

## 2021-09-21 MED ORDER — UMECLIDINIUM BROMIDE 62.5 MCG/ACT IN AEPB
1.0000 | INHALATION_SPRAY | Freq: Every day | RESPIRATORY_TRACT | Status: DC
  Administered 2021-09-22: 1 via RESPIRATORY_TRACT
  Filled 2021-09-21: qty 7

## 2021-09-21 MED ORDER — ALBUTEROL SULFATE (2.5 MG/3ML) 0.083% IN NEBU: 2.5000 mg | INHALATION_SOLUTION | RESPIRATORY_TRACT | Status: DC | PRN

## 2021-09-21 NOTE — Progress Notes (Signed)
Progress Note   Patient: Kristin [name] HBZ:[id number] DOB: [birth year] DOA: 09/18/2021     2 DOS: the patient was seen and examined on 09/21/2021   Brief hospital course: 88 y.o. African-American female with medical history significant for atrial fibrillation, diastolic CHF, CKD, COPD, gout and hepatitis C as well as dyslipidemia and hypertension, who presented to the ER with worsening dyspnea with associated dry cough and dyspnea on exertion, progressive lower extremity edema over the last few days, and reporting about 23 pound weight gain within a month.  Evaluation in the ED was consistent with acute on chronic diastolic CHF with elevated BNP and chest x-ray showing vascular congestion pulmonary edema.  Started on IV diuresis and admitted.  Cardiology consulted.  Patient was subsequently also started on steroids for COPD with acute exacerbation.    Assessment and Plan: * Acute on chronic diastolic CHF (congestive heart failure) ([place])- (present on admission) Presented with dyspnea, significant lower extremity pitting edema, elevated BNP and chest x-ray with vascular congestion. Echo in October 2021 showed EF 60 to 10%, grade 1 diastolic dysfunction. Started on IV diuresis and improving. -- Cardiology following -- Continue IV diuresis -- Monitor renal function and electrolytes -- Strict I/O's and daily weights  COPD with acute exacerbation ([place]) Patient has expiratory wheezing on exam and history of COPD on chronic oxygen therapy.  Started on IV steroid on 1/29. Today, patient remains with some mild wheezing but stable respiratory status and O2 need at this time. --Transition to prednisone --Resume home Breztri  --Albuterol nebs as needed  PVD (peripheral vascular disease) ([place])- (present on admission) Stable.  Patient had arthrectomy done this past month.  Lower extremity ultrasound negative for DVT.  Chronic anticoagulation (Eliquis) Continue Eliquis twice daily.  GERD  (gastroesophageal reflux disease)- (present on admission) Stable.  Continue PPI.  Long term current use of opiate analgesic Continue opiates.  Chronic pain syndrome- (present on admission) Chronic.  Continue pain regimen.  Chronic kidney disease (CKD), stage III (moderate) ([place])- (present on admission) Monitor serum creatinine during diuresis.        Subjective: Patient seen awake sitting up in bed with heart failure NP at bedside doing education.  Patient reports overall feeling better.  She is still wheezing but says her swelling in her legs is back to normal.  She denies other acute complaints at this time.  Physical Exam: Vitals:   09/21/21 0500 09/21/21 0827 09/21/21 1212 09/21/21 1551  BP:  124/70 99/71 113/68  Pulse:  62 62 (!) 59  Resp:  18 16 16   Temp:  98.3 F (36.8 C) 98.4 F (36.9 C) 98.2 F (36.8 C)  TempSrc:   Oral   SpO2:  95% 96% 92%  Weight: 69.6 kg     Height:       General exam: awake, alert, no acute distress HEENT: atraumatic, clear conjunctiva, anicteric sclera, moist mucus membranes, hearing grossly normal  Respiratory system: Fine crackles and expiratory wheezes on auscultation bilaterally, normal respiratory effort at rest, on 2 L/min nasal cannula O2. Cardiovascular system: normal S1/S2, RRR, trace lower extremity edema.   Gastrointestinal system: soft, NT, ND, no HSM felt, +bowel sounds. Central nervous system: A&O x3. no gross focal neurologic deficits, normal speech Extremities: moves all, normal tone Skin: dry, intact, normal temperature Psychiatry: normal mood, congruent affect   Data Reviewed:  Labs reviewed and notable for potassium 5.5, creatinine stable 1.34, BNP increased to 1234.8 .  Family Communication: None at bedside.  Will attempt to  call son as time allows  Disposition: Status is: Inpatient Remains inpatient appropriate because: Severity of illness remaining on IV diuresis for volume overload.  Discharge pending  clearance by cardiology and further clinical improvement  Planned Discharge Destination: Home with Home Health           Time spent: 35 minutes  Author: Ezekiel Slocumb, DO 09/21/2021 4:52 PM  For on call review [email or web address].

## 2021-09-21 NOTE — Progress Notes (Signed)
Physical Therapy Treatment Patient Details Name: LILITH SOLANA MRN: [id number] DOB: [birth year] Today's Date: 09/21/2021   History of Present Illness Pt is an 88 y/o F admitted on 09/18/21 after presenting to the ED with c/o acute onset of worsening dyspnea with associated dry cough & DOE & BLE edema over the past few days. Pt gained ~23 lbs in a month. Pt is being treated for acute on chronic diastolic CHF & COPD with acute exacerbation. PMH: a-fib, diastolic CHF, CKD, COPD, gout, hep C, dyslipidemia, HTN, breast CA s/p R lumpectomy, chronic hypoxic respiratory failure on O2, pacemaker, chronic pain syndrome    PT Comments    Pt received in Semi-Fowler's position and agreeable to therapy.  Pt able to participate in bed-level exercises with good technique and seemingly increased strength from prior sessions.  Pt then transferred to standing and participated in ambulation within room with [place].  Pt very unsteady, requiring CGA/minA to assist with balance during ambulation.  Pt given RW and pt able to ambulate more effectively and efficiently.  Pt did require initial cuing for body placement within the walker, but pt has good carryover and able to ambulate with good use.  Pt ambulated around nursing station then transferred back to bed and given call bell.  All other needs met at this time.  Current discharge plans to home with HHPT remain appropriate at this time.  Pt will continue to benefit from skilled therapy in order to address deficits listed below.    Recommendations for follow up therapy are one component of a multi-disciplinary discharge planning process, led by the attending physician.  Recommendations may be updated based on patient status, additional functional criteria and insurance authorization.  Follow Up Recommendations  Home health PT     Assistance Recommended at Discharge Intermittent Supervision/Assistance  Patient can return home with the following A little help with walking  and/or transfers;A little help with bathing/dressing/bathroom;Assistance with cooking/housework;Assist for transportation;Help with stairs or ramp for entrance   Equipment Recommendations  Rolling walker (2 wheels)    Recommendations for Other Services       Precautions / Restrictions Precautions Precautions: Fall Restrictions Weight Bearing Restrictions: No     Mobility  Bed Mobility Overal bed mobility: Needs Assistance Bed Mobility: Supine to Sit, Sit to Supine     Supine to sit: Modified independent (Device/Increase time), HOB elevated Sit to supine: Modified independent (Device/Increase time), HOB elevated        Transfers Overall transfer level: Needs assistance Equipment used: Rolling walker (2 wheels) Transfers: Sit to/from Stand Sit to Stand: Min guard                Ambulation/Gait Ambulation/Gait assistance: Min guard Gait Distance (Feet): 160 Feet Assistive device: Rolling walker (2 wheels) Gait Pattern/deviations: Decreased step length - right, Decreased step length - left, Decreased stride length Gait velocity: decreased     General Gait Details: Pt attempted ambulation with SPC, but unable to proceed due to decreased balance.  RW givne and pt able to ambulate with good technique.   Stairs             Wheelchair Mobility    Modified Rankin (Stroke Patients Only)       Balance Overall balance assessment: Needs assistance Sitting-balance support: Feet supported Sitting balance-Leahy Scale: Good     Standing balance support: During functional activity, Single extremity supported Standing balance-Leahy Scale: Poor  Cognition Arousal/Alertness: Awake/alert Behavior During Therapy: WFL for tasks assessed/performed Overall Cognitive Status: Within Functional Limits for tasks assessed                                          Exercises Total Joint Exercises Ankle  Circles/Pumps: AROM, Strengthening, Both, 10 reps, Supine Quad Sets: AROM, Strengthening, Both, 10 reps, Supine Gluteal Sets: AROM, Strengthening, Both, 10 reps, Supine Hip ABduction/ADduction: AROM, Strengthening, Both, 10 reps, Supine Straight Leg Raises: AROM, Strengthening, Both, 10 reps, Supine Marching in Standing: AROM, Strengthening, Both, 10 reps, Standing    General Comments General comments (skin integrity, edema, etc.): Pt on 2L/min of O2 with >90% reading throughout.      Pertinent Vitals/Pain Pain Assessment Pain Assessment: No/denies pain    Home Living                          Prior Function            PT Goals (current goals can now be found in the care plan section) Acute Rehab PT Goals Patient Stated Goal: get a room upstairs PT Goal Formulation: With patient Time For Goal Achievement: 10/05/21 Potential to Achieve Goals: Good Progress towards PT goals: Progressing toward goals    Frequency    Min 2X/week      PT Plan Current plan remains appropriate    Co-evaluation              AM-PAC PT "6 Clicks" Mobility   Outcome Measure  Help needed turning from your back to your side while in a flat bed without using bedrails?: None Help needed moving from lying on your back to sitting on the side of a flat bed without using bedrails?: None Help needed moving to and from a bed to a chair (including a wheelchair)?: A Little Help needed standing up from a chair using your arms (e.g., wheelchair or bedside chair)?: A Little Help needed to walk in hospital room?: A Little Help needed climbing 3-5 steps with a railing? : A Little 6 Click Score: 20    End of Session Equipment Utilized During Treatment: Oxygen Activity Tolerance: Patient tolerated treatment well Patient left: in bed Nurse Communication: Mobility status (O2) PT Visit Diagnosis: Muscle weakness (generalized) (M62.81);Unsteadiness on feet (R26.81)     Time: 7948-0165 PT  Time Calculation (min) (ACUTE ONLY): 56 min  Charges:  $Gait Training: 23-37 mins $Therapeutic Exercise: 23-37 mins                     Gwenlyn Saran, PT, DPT 09/21/21, 6:26 PM    Christie Nottingham 09/21/2021, 6:23 PM

## 2021-09-21 NOTE — TOC Initial Note (Addendum)
Transition of Care [facility]) - Initial/Assessment Note    Patient Details  Name: Kristin [name] MRN: [id number] Date of Birth: [birth year]  Transition of [facility] CM/SW Contact:    Alberteen Sam, LCSW Phone Number: 09/21/2021, 11:51 AM  Clinical Narrative:                  CSW met with patient at bedside, readmission risk assessment completed. Patient reports she is agreeable to [facility] health as she has had them in the past. PCP is [facility]. Patient reports she already has a 3in1 and a walker, no DME needs. Son to transport at time of discharge.   Reports using cane at baseline.   Referral sent to [facility] with Alvis Lemmings for [facility] PT and RN.   TOC will continue to follow for any additional discharge needs.    Expected Discharge Plan: [place] Barriers to Discharge: Continued Medical Work up   Patient Goals and CMS Choice Patient states their goals for this hospitalization and ongoing recovery are:: to go home CMS Medicare.gov Compare Post Acute Care list provided to:: Patient Choice offered to / list presented to : Patient  Expected Discharge Plan and Services Expected Discharge Plan: [place]       Living arrangements for the past 2 months: Single Family Home                           HH Arranged: PT, RN [facility] Agency: [place] Date [facility] Agency Contacted: 09/21/21 Time HH Agency Contacted: 3 Representative spoke with at [place]: cory  Prior Living Arrangements/Services Living arrangements for the past 2 months: [place] with:: Self   Do you feel safe going back to the place where you live?: Yes               Activities of Daily Living Home Assistive Devices/Equipment: Oxygen, Cane (specify quad or straight), Walker (specify type) ADL Screening (condition at time of admission) Patient's cognitive ability adequate to safely complete daily activities?: Yes Is the patient deaf or have difficulty  hearing?: No Does the patient have difficulty seeing, even when wearing glasses/contacts?: No Does the patient have difficulty concentrating, remembering, or making decisions?: No Patient able to express need for assistance with ADLs?: Yes Does the patient have difficulty dressing or bathing?: No Independently performs ADLs?: Yes (appropriate for developmental age) Does the patient have difficulty walking or climbing stairs?: No Weakness of Legs: Both Weakness of Arms/Hands: None  Permission Sought/Granted                  Emotional Assessment Appearance:: Appears stated age Attitude/Demeanor/Rapport: Gracious Affect (typically observed): Calm Orientation: : Oriented to Self, Oriented to Place, Oriented to  Time, Oriented to Situation Alcohol / Substance Use: Not Applicable Psych Involvement: No (comment)  Admission diagnosis:  Bilateral leg pain [M79.604, M79.605] Bilateral leg edema [R60.0] Acute CHF (congestive heart failure) (HCC) [X41.2] Acute diastolic CHF (congestive heart failure) (HCC) [I50.31] Acute on chronic congestive heart failure, unspecified heart failure type ([place]) [I50.9] Patient Active Problem List   Diagnosis Date Noted   COPD with acute exacerbation ([place]) 09/20/2021   Acute on chronic diastolic CHF (congestive heart failure) ([place]) 09/19/2021   Ischemic leg pain (Bilateral) 08/19/2021   AF (paroxysmal atrial fibrillation) ([place])    History of atrial fibrillation 04/08/2021   PVD (peripheral vascular disease) ([place]) 04/08/2021   Low  blood pressure reading 04/07/2021   Physical tolerance to opiate drug 01/28/2021   Chronic use of opiate for therapeutic purpose 11/24/2020   Atherosclerosis of native arteries of the extremities with ulceration ([place]) 17/79/3903   Uncomplicated opioid dependence ([place]) 10/20/2020   Encounter for chronic pain management 10/02/2020   Encounter for pain management counseling 10/02/2020   Chronic kidney disease 10/02/2020    Chronic sacroiliac pain (Bilateral) 09/30/2020   Osteoarthritis of sacroiliac joints (Bilateral) ([place]) 09/30/2020   Chronic midline low back pain without sciatica 09/30/2020   Prolonged QT interval 08/02/2020   Venous insufficiency of both lower extremities 07/04/2020   Varicose veins of left lower extremity with inflammation, with ulcer of ankle limited to breakdown of skin ([place]) 04/01/2020   Opioid dependence with opioid-induced disorder ([place]) 02/14/2020   Abnormal MRI, lumbar spine (11/03/2019) 01/23/2020   Pharmacologic therapy 12/20/2019   Other specified dorsopathies, sacral and sacrococcygeal region 06/12/2019   Chronic hip pain (Left) 06/12/2019   Osteoarthritis of hip (Left) 06/12/2019   Greater trochanteric bursitis of hip (Left) 03/12/2019   Preop testing 01/22/2019   Cervicalgia 10/03/2018   Chronic shoulders pain (Bilateral) (L>R) 05/29/2018   Osteoarthritis of shoulder (Bilateral) 05/29/2018   Osteoarthritis of hip (Bilateral) 05/29/2018   Chronic shoulder pain after shoulder replacement (Bilateral) (L>R) 05/29/2018   Trigger finger, left middle finger 05/29/2018   Spondylosis without myelopathy or radiculopathy, lumbosacral region 02/09/2018   Insomnia 02/01/2018   Numbness and tingling of upper extremity (Bilateral) 09/07/2017   Chronic shoulder pain, Radicular (Bilateral) (L>R) 09/07/2017   Cervical facet syndrome (Bilateral) (L>R) 09/07/2017   Cervical (3-4 mm) Anterolisthesis of C4 on C5 09/07/2017   Cervical foraminal stenosis (Bilateral) 09/07/2017   Acute kidney injury superimposed on CKD ([place]) 08/14/2017   Chronic lower extremity pain (2ry area of Pain) (Bilateral) (L>R) 05/13/2017   DDD (degenerative disc disease), cervical 05/13/2017   Chronic cervical radicular pain (3ry area of Pain) (Bilateral) (L>R) 05/04/2017   Chronic anticoagulation (Eliquis) 05/04/2017   Chronic hip pain (Bilateral) 04/20/2017   Chronic neck pain 03/01/2017   Hyperlipidemia  02/02/2017   Red blood cell antibody positive 11/18/2016   Anticoagulated 11/17/2016   Atrial fibrillation and flutter ([place]) 11/17/2016   Osteoarthritis involving multiple joints 06/14/2016   Cardiac pacemaker in situ 01/07/2016   S/P cardiac pacemaker procedure 01/07/2016   GERD (gastroesophageal reflux disease) 12/18/2015   Chronic pain syndrome 06/25/2015   Chronic low back pain (1ry area of Pain) (Bilateral) (L>R) w/ sciatica (Bilateral) 06/25/2015   Lumbar facet syndrome (Bilateral) (L>R) 06/25/2015   Lumbar spondylosis 06/25/2015   Chronic lumbar radicular pain (Bilateral) (L>R) 06/25/2015   Restless leg syndrome 06/25/2015   Myofascial pain 06/25/2015   Neurogenic pain 06/25/2015   Long term current use of opiate analgesic 06/25/2015   Long term prescription opiate use 06/25/2015   Opiate use (90 MME/Day) 06/25/2015   Opioid-induced constipation (OIC) 06/25/2015   Vitamin D insufficiency 06/25/2015   Grade 1 Retrolisthesis of L1 over L2 06/25/2015   Lumbar facet hypertrophy (L1-2, L2-3, and L4-5) 06/25/2015   Lumbar lateral recess stenosis (L1-2 and L4-5) 06/25/2015   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) 06/25/2015   Epidural fibrosis 06/25/2015   Lumbar foraminal stenosis (Severe Left L4-5) 06/25/2015   Osteoporosis 06/25/2015   Chronic sacroiliac joint pain (Left) 06/25/2015   History of methicillin resistant staphylococcus aureus (MRSA) 06/25/2015   Mitral valve prolapse 06/25/2015   Seasonal allergies 06/17/2015   History of breast cancer 06/17/2015  Chronic diastolic CHF (congestive heart failure) ([place]) 12/26/2014   Centrilobular emphysema ([place]) 09/16/2014   S/P cardiac catheterization 04/04/2014   DDD (degenerative disc disease), lumbosacral 04/03/2014   Benign hypertension with CKD (chronic kidney disease) stage III ([place]) 04/03/2014   Lumbar central spinal stenosis 04/03/2014   Chronic kidney disease (CKD), stage III (moderate)  ([place]) 10/30/2013   Gout 10/30/2013   PCP:  Olin Hauser, DO Pharmacy:   [place], [place] - [address] [place] [address] Phone: [phone] Fax: [phone]     Social Determinants of Health (SDOH) Interventions    Readmission Risk Interventions Readmission Risk Prevention Plan 09/21/2021 05/25/2020  Transportation Screening Complete Complete  PCP or Specialist Appt within 3-5 Days Complete Not Complete  Not Complete comments - Sunday discharge PCP not available  [place] or [place] Complete Complete  Social Work Consult for [place] Planning/Counseling Complete Complete  Palliative Care Screening Not Applicable Not Applicable  Medication Review Press photographer) Complete Complete  Some recent data might be hidden

## 2021-09-21 NOTE — Telephone Encounter (Signed)
Kristin [name] from [employer], called in checking if fax was received for rolater. Please call back

## 2021-09-21 NOTE — Consult Note (Signed)
° °  Heart Failure Nurse Navigator Note  HFpEF 60 to 65%.  Mild left ventricular hypertrophy.  Grade 1 diastolic dysfunction.  Normal right ventricular systolic function and size.  Moderately dilated left atria.  Mild to moderate lead dilated right atria.  There is moderate calcification of the aortic valve.  She presented from home with complaints of worsening dyspnea with dry cough, lower extremity edema.  She noted a 23 pound weight gain within the month.  BNP 1100.  Chest x-ray revealed pulmonary edema.  Comorbidities:  Paroxysmal atrial fibrillation Hyperlipidemia neck Peripheral neuropathy Arthritis Breast cancer Chronic kidney disease COPD Gout Hyperlipidemia  Medications:  Eliquis 2.5 mg 2 times a day Aspirin 81 mg daily Atorvastatin 20 mg daily Furosemide 40 mg IV every 12   Labs:  BNP performed on September 20 1232 up from 1107 on admission. Sodium 137, potassium 5.5, chloride 100, CO2 28, BUN 31, creatinine 1.34, magnesium 1.9 Weight is 69.6 kg Blood pressure 124/70 Intake 240 mL Output 1150 mL   Initial meeting with patient, she is lying quietly in bed in no acute distress.  She states that her son lives with her.  Her activities include housework and fixing the meals.  She states she knows longer does the laundry because that is located in the basement and she does not do the stairs.  Discussed heart failure, she states that she has heard that term in regards to herself in the past.  Discussed what it means.  She voices understanding.  Discussed the importance of fluid restriction, in a days time she drinks 84 mL of just water.  In addition to 3 cups of coffee.  Discussed limiting to no more than 64 ounces in a days time.  She voices understanding.  She states that she continues to use salt at the table, she also eats bacon, ham and canned vegetables.  Talked about making wise choices and limiting her sodium intake to no more than 2000 mg daily.  Also discussed the  relationship between sodium and fluids.  She voices understanding.  She weighs herself occasionally, discussed the importance of weighing daily and what to report.  Talked about follow-up in the outpatient heart failure clinic.  She has an appointment on February 6 at 2:30 in the afternoon.  She has a 1% no-show ratio that is 3 out of 325 appointments.  She was given a living with heart failure teaching booklet, zone magnet, weight chart and information on low-sodium.  He had no further questions, we will continue to follow along.  Pricilla Riffle RN CHFN

## 2021-09-21 NOTE — Plan of Care (Signed)
°  Problem: Education: Goal: Ability to demonstrate management of disease process will improve 09/21/2021 1147 by Emmaline Life, RN Outcome: Progressing 09/21/2021 1146 by Emmaline Life, RN Outcome: Progressing Goal: Ability to verbalize understanding of medication therapies will improve 09/21/2021 1147 by Emmaline Life, RN Outcome: Progressing 09/21/2021 1146 by Emmaline Life, RN Outcome: Progressing

## 2021-09-21 NOTE — Progress Notes (Signed)
[facility] Cardiology    SUBJECTIVE: Patient states to be doing reasonably well complains of generalized weakness shortness of breath somewhat improved heart rate of around 60 no chest pain still has leg pain but no swelling   Vitals:   09/21/21 0002 09/21/21 0358 09/21/21 0500 09/21/21 0827  BP: 116/64 122/78  124/70  Pulse: 61 61  62  Resp: 16 20  18   Temp: 97.8 F (36.6 C) 98 F (36.7 C)  98.3 F (36.8 C)  TempSrc:      SpO2: 94% 98%  95%  Weight:   69.6 kg   Height:         Intake/Output Summary (Last 24 hours) at 09/21/2021 1202 Last data filed at 09/21/2021 1100 Gross per 24 hour  Intake 480 ml  Output 2250 ml  Net -1770 ml      PHYSICAL EXAM  General: Well developed, well nourished, in no acute distress HEENT:  Normocephalic and atramatic Neck:  No JVD.  Lungs: Clear bilaterally to auscultation and percussion. Heart: HRRR . Normal S1 and S2 without gallops or murmurs.  Abdomen: Bowel sounds are positive, abdomen soft and non-tender  Msk:  Back normal, normal gait. Normal strength and tone for age. Extremities: No clubbing, cyanosis or edema.   Neuro: Alert and oriented X 3. Psych:  Good affect, responds appropriately   LABS: Basic Metabolic Panel: Recent Labs    09/19/21 0705 09/20/21 0713  NA 136 137  K 4.2 5.5*  CL 102 100  CO2 27 28  GLUCOSE 78 91  BUN 28* 31*  CREATININE 1.36* 1.34*  CALCIUM 8.7* 9.2  MG  --  1.9   Liver Function Tests: No results for input(s): AST, ALT, ALKPHOS, BILITOT, PROT, ALBUMIN in the last 72 hours. No results for input(s): LIPASE, AMYLASE in the last 72 hours. CBC: Recent Labs    09/18/21 1647 09/19/21 0705  WBC 5.3   5.7 5.5  NEUTROABS 2.6  --   HGB 11.3*   11.2* 10.7*  HCT 37.6   37.2 34.3*  MCV 95.9   95.6 91.0  PLT 204   209 198   Cardiac Enzymes: Recent Labs    09/18/21 2256  CKTOTAL 173   BNP: Invalid input(s): POCBNP D-Dimer: No results for input(s): DDIMER in the last 72 hours. Hemoglobin  A1C: No results for input(s): HGBA1C in the last 72 hours. Fasting Lipid Panel: No results for input(s): CHOL, HDL, LDLCALC, TRIG, CHOLHDL, LDLDIRECT in the last 72 hours. Thyroid Function Tests: No results for input(s): TSH, T4TOTAL, T3FREE, THYROIDAB in the last 72 hours.  Invalid input(s): FREET3 Anemia Panel: No results for input(s): VITAMINB12, FOLATE, FERRITIN, TIBC, IRON, RETICCTPCT in the last 72 hours.  DG Chest Port 1 View  Result Date: 09/20/2021 CLINICAL DATA:  Audible wheezing.  Leg swelling and redness EXAM: PORTABLE CHEST 1 VIEW COMPARISON:  Yesterday FINDINGS: Diffuse interstitial opacity, cardiomegaly, and vascular pedicle widening. Enlarged hilar vessels. A few Kerley lines are present. There is emphysema with fibrotic features by 2020 chest CT. IMPRESSION: Chronic lung disease which could obscure CHF. There is definite cardiomegaly and central vascular congestion. Electronically Signed   By: Jorje Guild M.D.   On: 09/20/2021 07:19     Echo previous echo ejection fraction around 60%  TELEMETRY: Possibly paced rhythm rate of around 70 right bundle branch block left anterior hemiblock:  ASSESSMENT AND PLAN:  Principal Problem:   Acute on chronic diastolic CHF (congestive heart failure) (HCC) Active Problems:   Chronic  kidney disease (CKD), stage III (moderate) (HCC)   Chronic pain syndrome   Long term current use of opiate analgesic   GERD (gastroesophageal reflux disease)   Chronic anticoagulation (Eliquis)   PVD (peripheral vascular disease) (HCC)   COPD with acute exacerbation (HCC)    Plan 1 Atrial fibrillation chronic stable recommend anticoagulation recommend rate control continue Eliquis 2 diastolic congestive heart failure continue low-dose diuretic therapy follow-up BMP 3 chronic renal insufficiency recommend adequate hydration consider follow-up with nephrology 4 GERD by history recommend continue PPI omeprazole or Protonix 5 Lipitor therapy for  statin management and control 6 COPD asthma continue inhalers as necessary 7 supplemental oxygen for hypoxemia 8 chronic leg pain continue gabapentin narcotics as necessary 9 recommend conservative management from a cardiac standpoint   Yolonda Kida, MD 09/21/2021 12:02 PM

## 2021-09-21 NOTE — Plan of Care (Signed)
  Problem: Education: Goal: Ability to demonstrate management of disease process will improve Outcome: Progressing Goal: Ability to verbalize understanding of medication therapies will improve Outcome: Progressing   

## 2021-09-22 DIAGNOSIS — I5033 Acute on chronic diastolic (congestive) heart failure: Secondary | ICD-10-CM | POA: Diagnosis not present

## 2021-09-22 LAB — MAGNESIUM: Magnesium: 2.2 mg/dL (ref 1.7–2.4)

## 2021-09-22 LAB — BASIC METABOLIC PANEL
Anion gap: 10 (ref 5–15)
BUN: 39 mg/dL — ABNORMAL HIGH (ref 8–23)
CO2: 29 mmol/L (ref 22–32)
Calcium: 8.9 mg/dL (ref 8.9–10.3)
Chloride: 98 mmol/L (ref 98–111)
Creatinine, Ser: 1.26 mg/dL — ABNORMAL HIGH (ref 0.44–1.00)
GFR, Estimated: 41 mL/min — ABNORMAL LOW (ref >60)
Glucose, Bld: 83 mg/dL (ref 70–99)
Potassium: 3.6 mmol/L (ref 3.5–5.1)
Sodium: 137 mmol/L (ref 135–145)

## 2021-09-22 MED ORDER — FUROSEMIDE 40 MG PO TABS: 40.0000 mg | ORAL_TABLET | Freq: Every day | ORAL | 1 refills | Status: DC

## 2021-09-22 MED ORDER — PREDNISONE 20 MG PO TABS: 40.0000 mg | ORAL_TABLET | Freq: Every day | ORAL | 0 refills | Status: AC

## 2021-09-22 MED ORDER — FUROSEMIDE 40 MG PO TABS: 40.0000 mg | ORAL_TABLET | Freq: Every day | ORAL | Status: DC

## 2021-09-22 NOTE — TOC Transition Note (Signed)
Transition of Care [facility]) - CM/SW Discharge Note   Patient Details  Name: Kristin [name] MRN: [id number] Date of Birth: 01-15-1930  [facility] CM/SW Contact:  Alberteen Sam, LCSW Phone Number: 09/22/2021, 12:53 PM   Clinical Narrative:      Patient to discharge home with home health, Tommi Rumps with [facility] informed of dc today.    PCP is Designer, multimedia.   Patient reports she already has a 3in1 and a walker, no DME needs.   Son to transport home at time of discharge.    Reports using cane at baseline.    No further discharge needs identified.   Final next level of care: [place] Barriers to Discharge: No Barriers Identified   Patient Goals and CMS Choice Patient states their goals for this hospitalization and ongoing recovery are:: to go home CMS Medicare.gov Compare Post Acute Care list provided to:: Patient Choice offered to / list presented to : Patient  Discharge Placement                    Patient and family notified of of transfer: 09/22/21  Discharge Plan and Services                          HH Arranged: PT, RN [facility] Agency: [place] Date [place]: 09/22/21 Time [place]: 1253 Representative spoke with at [place]: [place] ([place]) Interventions     Readmission Risk Interventions Readmission Risk Prevention Plan 09/21/2021 05/25/2020  Transportation Screening Complete Complete  PCP or Specialist Appt within 3-5 Days Complete Not Complete  Not Complete comments - Sunday discharge PCP not available  [place] or [place] Complete Complete  Social Work Consult for [place] Planning/Counseling Complete Complete  Palliative Care Screening Not Applicable Not Applicable  Medication Review Press photographer) Complete Complete  Some recent data might be hidden

## 2021-09-22 NOTE — Discharge Summary (Addendum)
Physician Discharge Summary   Patient: Kristin [name] MRN: [id number] DOB: 1929/08/25  Admit date:     09/18/2021  Discharge date: 10/21/21  Discharge Physician: Ezekiel Slocumb   PCP: Olin Hauser, DO   Recommendations at discharge:    Follow up with Dr. Saralyn Pilar in 1-2 weeks Follow up with PCP in 1-2 week Check BMP/CBC in 1-2 week  Discharge Diagnoses: Principal Problem:   Acute on chronic diastolic CHF (congestive heart failure) (HCC) Active Problems:   Chronic kidney disease (CKD), stage III (moderate) (HCC)   Chronic pain syndrome   Long term current use of opiate analgesic   GERD (gastroesophageal reflux disease)   Chronic anticoagulation (Eliquis)   PVD (peripheral vascular disease) (HCC)   COPD with acute exacerbation ([place]) CKD Stage IIIb   Hospital Course: 88 y.o. African-American female with medical history significant for atrial fibrillation, diastolic CHF, CKD, COPD, gout and hepatitis C as well as dyslipidemia and hypertension, who presented to the ER with worsening dyspnea with associated dry cough and dyspnea on exertion, progressive lower extremity edema over the last few days, and reporting about 23 pound weight gain within a month.  Evaluation in the ED was consistent with acute on chronic diastolic CHF with elevated BNP and chest x-ray showing vascular congestion pulmonary edema.  Started on IV diuresis and admitted.  Cardiology consulted.  Patient was subsequently also started on steroids for COPD with acute exacerbation.    Assessment and Plan: * Acute on chronic diastolic CHF (congestive heart failure) ([place])- (present on admission) Presented with dyspnea, significant lower extremity pitting edema, elevated BNP and chest x-ray with vascular congestion. Echo in October 2021 showed EF 60 to 60%, grade 1 diastolic dysfunction. Started on IV diuresis and clinically improved. Euvolemic with improved dyspnea and edema.  -- Cardiology consulted -  outpatient f/u in 1-2 weeks -- Treated with IV diuresis --Resumed PO diuretic at discharge  -- Monitor renal function and electrolytes in follow up -- Continue home daily weights for monitoring volume status  COPD with acute exacerbation ([place]) Patient has expiratory wheezing on exam and history of COPD on chronic oxygen therapy.  Started on IV steroid on 1/29. Today, patient is without wheezing with improved air movement.   Clinically improved and stable for discharge.  --Prednisone at d/c --Continue Breztri  --Albuterol nebs as needed  PVD (peripheral vascular disease) ([place])- (present on admission) Stable.  Patient had arthrectomy done this past month.  Lower extremity ultrasound negative for DVT.  Chronic anticoagulation (Eliquis) Continue Eliquis twice daily.  GERD (gastroesophageal reflux disease)- (present on admission) Stable.  Continue PPI.  Long term current use of opiate analgesic Continue opiates.  Chronic pain syndrome- (present on admission) Chronic.  Continue pain regimen.  Chronic kidney disease (CKD), stage III (moderate) ([place])- (present on admission) Monitor serum creatinine during diuresis.    Acute on chronic respiratory failure with hypoxia - baseline 2 L/min nocturnal oxygen. Required continuous oxygen during admission, improved with diuresis.       Consultants: Cardiology Procedures performed: None Disposition: Home health Diet recommendation:  Discharge Diet Orders (From admission, onward)     Start     Ordered   09/22/21 0000  Diet - low sodium heart healthy        09/22/21 1213           Cardiac diet  DISCHARGE MEDICATION: Allergies as of 09/22/2021       Reactions   Flexeril [cyclobenzaprine Hcl]    Severe Rash  Vancomycin Rash   [place] Syndrome        Medication List     STOP taking these medications    doxycycline 100 MG capsule Commonly known as: VIBRAMYCIN       TAKE these medications    acetaminophen  500 MG tablet Commonly known as: TYLENOL Take 500 mg by mouth in the morning and at bedtime.   apixaban 2.5 MG Tabs tablet Commonly known as: ELIQUIS Take 1 tablet (2.5 mg total) by mouth 2 (two) times daily.   atorvastatin 20 MG tablet Commonly known as: LIPITOR TAKE 1 TABLET BY MOUTH ONCE DAILY FOR CHOLESTEROL   bisacodyl 5 MG EC tablet Commonly known as: DULCOLAX Take 2 tablets (10 mg total) by mouth daily as needed for moderate constipation.   Breztri Aerosphere 160-9-4.8 MCG/ACT Aero Generic drug: Budeson-Glycopyrrol-Formoterol Inhale 2 puffs into the lungs 2 (two) times daily.   furosemide 40 MG tablet Commonly known as: LASIX Take 1 tablet (40 mg total) by mouth daily. What changed:  medication strength how much to take when to take this reasons to take this   gabapentin 100 MG capsule Commonly known as: NEURONTIN TAKE 3 CAPSULES BY MOUTH AT BEDTIME   GNP Aspirin Low Dose 81 MG EC tablet Generic drug: aspirin TAKE 1 TABLET BY MOUTH ONCE DAILY. *SWALLOW WHOLE*   levocetirizine 5 MG tablet Commonly known as: XYZAL TAKE (1/2) TABLET BY MOUTH EVERY OTHER DAY.   Melatonin 10 MG Tabs Take 10 mg by mouth at bedtime.   oxyCODONE 5 MG immediate release tablet Commonly known as: Oxy IR/ROXICODONE Take 2 tablets (10 mg total) by mouth 2 (two) times daily. May also take 1 tablet (5 mg total) at bedtime as needed for severe pain. Must last 30 days.. Start taking on: October 24, 2021 What changed: Another medication with the same name was removed. Continue taking this medication, and follow the directions you see here.   OXYGEN Inhale 2 L into the lungs daily.   Vitamin D 50 MCG (2000 UT) tablet Take 2,000 Units by mouth daily.       ASK your doctor about these medications    predniSONE 20 MG tablet Commonly known as: DELTASONE Take 2 tablets (40 mg total) by mouth daily for 2 days. Ask about: Should I take this medication?        Follow-up Information      Paraschos, Alexander, MD. Go in 1 week(s).   Specialty: Cardiology Why: Please follow-up 1 to 2 weeks after discharge. Appointment scheduled for 10/05/21 at 10:30 with Clabe Seal, PA Contact information: [address] [facility] [place] [address] [phone]         Olin Hauser, DO. Schedule an appointment as soon as possible for a visit in 1 week(s).   Specialty: Family Medicine Why: Hospital follow up appointment in 1 week. Appointment scheduled on Tues, 09-29-2021 at 11:00am Contact information: [place] [address] [phone]         [place]. Go to.   Specialty: Cardiology Why: Follow up as scheduled Next appt scheduled for 09/28/21 at 2:30pm with [name], NP Contact information: [place] [phone]                Discharge Exam: Filed Weights   09/18/21 1629 09/21/21 0500 09/22/21 0504  Weight: 78 kg 69.6 kg 69.8 kg   General exam: awake,  alert, no acute distress HEENT: atraumatic, clear conjunctiva, anicteric sclera, moist mucus membranes, hearing grossly normal  Respiratory system: CTAB with improved aeration, no expiratory wheezes, normal respiratory effort at rest. Cardiovascular system: normal S1/S2, RRR, no JVD, murmurs, rubs, gallops, no pedal edema.   Gastrointestinal system: soft, NT, ND, no HSM felt, +bowel sounds. Central nervous system: A&O x3. no gross focal neurologic deficits, normal speech Extremities: moves all, no edema, normal tone Skin: dry, intact, normal temperature Psychiatry: normal mood, congruent affect, judgement and insight appear normal    Condition at discharge: stable  The results of significant diagnostics from this hospitalization (including imaging, microbiology, ancillary and laboratory) are listed below for reference.   Imaging Studies: VAS [name] ABI  WITH/WO TBI  Result Date: 09/24/2021  LOWER EXTREMITY DOPPLER STUDY Patient Name:  JALIANA MEDELLIN  Date of Exam:   09/24/2021 Medical Rec #: [id number]      Accession #:    [id number] Date of Birth: 07-Nov-1929       Patient Gender: F Patient Age:   32 years Exam Location:  [facility] Procedure:      VAS [name] ABI WITH/WO TBI Referring Phys: Hortencia Pilar --------------------------------------------------------------------------------  Indications: Peripheral artery disease.  Vascular Interventions: 10/14/2020: PTA of the Left SFA and Popliteal Artery.                         PTA of the Left Posterior tibial Artery and                         Tibioperoneal trunk. PTA and Stent placement Left EIA. Comparison Study: 08/19/2021 Performing Technologist: Almira Coaster RVS  Examination Guidelines: A complete evaluation includes at minimum, Doppler waveform signals and systolic blood pressure reading at the level of bilateral brachial, anterior tibial, and posterior tibial arteries, when vessel segments are accessible. Bilateral testing is considered an integral part of a complete examination. Photoelectric Plethysmograph (PPG) waveforms and toe systolic pressure readings are included as required and additional duplex testing as needed. Limited examinations for reoccurring indications may be performed as noted.  ABI Findings: +---------+------------------+-----+----------+--------+  Right     Rt Pressure (mmHg) Index Waveform   Comment   +---------+------------------+-----+----------+--------+  ATA       168                1.39  monophasic           +---------+------------------+-----+----------+--------+  PTA       0                  0.00  absent               +---------+------------------+-----+----------+--------+  Great Toe 139                1.15  Normal               +---------+------------------+-----+----------+--------+ +---------+------------------+-----+--------+-------+  Left      Lt Pressure  (mmHg) Index Waveform Comment  +---------+------------------+-----+--------+-------+  Brachial  121                                        +---------+------------------+-----+--------+-------+  ATA       120                0.99  biphasic          +---------+------------------+-----+--------+-------+  PTA       142                1.17  biphasic          +---------+------------------+-----+--------+-------+  Great Toe 116                0.96  Normal            +---------+------------------+-----+--------+-------+ +-------+-----------+-----------+------------+------------+  ABI/TBI Today's ABI Today's TBI Previous ABI Previous TBI  +-------+-----------+-----------+------------+------------+  Right   1.39        1.15        .84          .55           +-------+-----------+-----------+------------+------------+  Left    1.17        .96         1.11         .81           +-------+-----------+-----------+------------+------------+  Bilateral TBIs appear increased compared to prior study on 08/19/2021. Left ABIs appear essentially unchanged compared to prior study on 08/19/2021. Rt ABIs appear increased compared to prior study on 08/19/2021.  Summary: Right: Resting right ankle-brachial index is within normal range. No evidence of significant right lower extremity arterial disease. The right toe-brachial index is normal. Left: Resting left ankle-brachial index is within normal range. No evidence of significant left lower extremity arterial disease. The left toe-brachial index is normal.  *See table(s) above for measurements and observations.  Electronically signed by Hortencia Pilar MD on 09/24/2021 at 3:09:19 PM.    Final    VAS [name] LE ART SEG MULTI (Segm&LE Reynauds)  Result Date: 10/19/2021  LOWER EXTREMITY DOPPLER STUDY Patient Name:  [name]  Date of Exam:   10/15/2021 Medical Rec #: [id number]      Accession #:    [id number] Date of Birth: [birth year]       Patient Gender: F Patient Age:   76 years Exam Location:   [facility] Procedure:      VAS [name] LOWER EXT ART SEG MULTI (SEGMENTALS \\T \ LE RAYNAUDS) Referring Phys: GREGORY SCHNIER --------------------------------------------------------------------------------  Indications: Peripheral artery disease.  Vascular Interventions: 10/14/20: Left EIA sten with left SFA/popliteal/TP                         trunk/PTA angioplasties;                         09/08/21: Right SFA/popliteal thrombectomy/PTA;. Performing Technologist: Blondell Reveal RT, RDMS, RVT  Examination Guidelines: A complete evaluation includes at minimum, Doppler waveform signals and systolic blood pressure reading at the level of bilateral brachial, anterior tibial, and posterior tibial arteries, when vessel segments are accessible. Bilateral testing is considered an integral part of a complete examination. Photoelectric Plethysmograph (PPG) waveforms and toe systolic pressure readings are included as required and additional duplex testing as needed. Limited examinations for reoccurring indications may be performed as noted.  ABI Findings: +-------------+------------------+-----+---------------------------+--------+  Right         Rt Pressure (mmHg) Index Waveform                    Comment   +-------------+------------------+-----+---------------------------+--------+  Brachial      84                                                             +-------------+------------------+-----+---------------------------+--------+  CFA                                    monophasic/hyperemic                  +-------------+------------------+-----+---------------------------+--------+  SFA/Popliteal                          monophasic/mild hyperemia             +-------------+------------------+-----+---------------------------+--------+  ATA           146                1.62  monophasic/mildly hyperemic           +-------------+------------------+-----+---------------------------+--------+  PTA           111                 1.23  monophasic/mildly hyperemic           +-------------+------------------+-----+---------------------------+--------+  PERO          140                1.56  monophasic/mildly hyperemic           +-------------+------------------+-----+---------------------------+--------+  Great Toe     46                 0.51  Normal                                +-------------+------------------+-----+---------------------------+--------+ +-------------+------------------+-----+-------------------------+-------+  Left          Lt Pressure (mmHg) Index Waveform                  Comment  +-------------+------------------+-----+-------------------------+-------+  Brachial      90                                                          +-------------+------------------+-----+-------------------------+-------+  CFA                                    Triphasisc/mild hyperemia          +-------------+------------------+-----+-------------------------+-------+  SFA/Popliteal                          monophasic/mild hyperemia          +-------------+------------------+-----+-------------------------+-------+  ATA           132                1.47  monophasic                         +-------------+------------------+-----+-------------------------+-------+  PTA           119                1.32  monophasic                         +-------------+------------------+-----+-------------------------+-------+  Great Toe     44  0.49  Normal                             +-------------+------------------+-----+-------------------------+-------+ +-------+-----------+-----------+------------+------------+  ABI/TBI Today's ABI Today's TBI Previous ABI Previous TBI  +-------+-----------+-----------+------------+------------+  Right   >1.3        0.51        >1.3         1.15          +-------+-----------+-----------+------------+------------+  Left    >1.3        0.49        1.17         0.96           +-------+-----------+-----------+------------+------------+ Bilateral TBIs appear decreased compared to prior study on 09/24/21.  Summary: Right: Increased resting ankle-brachial indices suggest medial calcification. Doppler waveforms and abnormal TBI suggest possible significant arterial occlusive disease. Left: Increased resting ankle-brachial indices suggest medial calcification. Doppler waveforms and abnormal TBI suggest possible significant arterial occlusive disease.  *See table(s) above for measurements and observations.  Electronically signed by Hortencia Pilar MD on 10/19/2021 at 1:02:11 PM.    Final    VAS [name] LOWER EXTREMITY ARTERIAL DUPLEX  Result Date: 09/24/2021 LOWER EXTREMITY ARTERIAL DUPLEX STUDY Patient Name:  TANEIA MEALOR  Date of Exam:   09/24/2021 Medical Rec #: [id number]      Accession #:    [id number] Date of Birth: Apr 05, 1930       Patient Gender: F Patient Age:   77 years Exam Location:  [facility] Procedure:      VAS [name] LOWER EXTREMITY ARTERIAL DUPLEX Referring Phys: Hortencia Pilar --------------------------------------------------------------------------------  Indications: Peripheral artery disease.  Vascular Interventions: 10/14/2020: PTA of the Left SFA and Popliteal Artery.                         PTA of the Left Posterior tibial Artery and                         Tibioperoneal trunk. PTA and Stent placement Left EIA. Current ABI:            Rt 1.39, Lt 1.17 Comparison Study: 08/19/2021 Performing Technologist: Almira Coaster RVS  Examination Guidelines: A complete evaluation includes B-mode imaging, spectral Doppler, color Doppler, and power Doppler as needed of all accessible portions of each vessel. Bilateral testing is considered an integral part of a complete examination. Limited examinations for reoccurring indications may be performed as noted.  +----------+--------+-----+--------+----------+--------+  RIGHT      PSV cm/s Ratio Stenosis Waveform   Comments   +----------+--------+-----+--------+----------+--------+  CFA Distal 149                     biphasic             +----------+--------+-----+--------+----------+--------+  DFA        36                      biphasic             +----------+--------+-----+--------+----------+--------+  SFA Prox   110                     biphasic             +----------+--------+-----+--------+----------+--------+  SFA Mid    82  biphasic             +----------+--------+-----+--------+----------+--------+  SFA Distal 69                      biphasic             +----------+--------+-----+--------+----------+--------+  POP Distal 61                      monophasic           +----------+--------+-----+--------+----------+--------+  ATA Distal 26                      monophasic           +----------+--------+-----+--------+----------+--------+  PTA Distal 0                       Absent               +----------+--------+-----+--------+----------+--------+  Summary: Right: Imaging and Waveforms obtained in the Right Lower Extremity. No flow seen in the Right Posterior Tibial Artery.  See table(s) above for measurements and observations. Electronically signed by Hortencia Pilar MD on 09/24/2021 at 3:11:50 PM.    Final     Microbiology: Results for orders placed or performed during the hospital encounter of 09/18/21  Resp Panel by RT-PCR (Flu A&B, Covid) Nasopharyngeal Swab     Status: None   Collection Time: 09/19/21  3:22 AM   Specimen: Nasopharyngeal Swab; Nasopharyngeal(NP) swabs in vial transport medium  Result Value Ref Range Status   SARS Coronavirus 2 by RT PCR NEGATIVE NEGATIVE Final    Comment: (NOTE) SARS-CoV-2 target nucleic acids are NOT DETECTED.  The SARS-CoV-2 RNA is generally detectable in upper respiratory specimens during the acute phase of infection. The lowest concentration of SARS-CoV-2 viral copies this assay can detect is 138 copies/mL. A negative result does not preclude  SARS-Cov-2 infection and should not be used as the sole basis for treatment or other patient management decisions. A negative result may occur with  improper specimen collection/handling, submission of specimen other than nasopharyngeal swab, presence of viral mutation(s) within the areas targeted by this assay, and inadequate number of viral copies(<138 copies/mL). A negative result must be combined with clinical observations, patient history, and epidemiological information. The expected result is Negative.  Fact Sheet for Patients:  EntrepreneurPulse.com.au  Fact Sheet for Healthcare Providers:  IncredibleEmployment.be  This test is no t yet approved or cleared by the [place] FDA and  has been authorized for detection and/or diagnosis of SARS-CoV-2 by FDA under an Emergency Use Authorization (EUA). This EUA will remain  in effect (meaning this test can be used) for the duration of the COVID-19 declaration under Section 564(b)(1) of the Act, 21 U.S.C.section 360bbb-3(b)(1), unless the authorization is terminated  or revoked sooner.       Influenza A by PCR NEGATIVE NEGATIVE Final   Influenza B by PCR NEGATIVE NEGATIVE Final    Comment: (NOTE) The Xpert Xpress SARS-CoV-2/FLU/RSV plus assay is intended as an aid in the diagnosis of influenza from Nasopharyngeal swab specimens and should not be used as a sole basis for treatment. Nasal washings and aspirates are unacceptable for Xpert Xpress SARS-CoV-2/FLU/RSV testing.  Fact Sheet for Patients: EntrepreneurPulse.com.au  Fact Sheet for Healthcare Providers: IncredibleEmployment.be  This test is not yet approved or cleared by the [place] FDA and has been authorized for detection and/or diagnosis of SARS-CoV-2 by FDA under  an Emergency Use Authorization (EUA). This EUA will remain in effect (meaning this test can be used) for the duration of  the COVID-19 declaration under Section 564(b)(1) of the Act, 21 U.S.C. section 360bbb-3(b)(1), unless the authorization is terminated or revoked.  Performed at [facility], [address] [place], [place] [address]     Labs: CBC: No results for input(s): WBC, NEUTROABS, HGB, HCT, MCV, PLT in the last 168 hours.  Basic Metabolic Panel: No results for input(s): NA, K, CL, CO2, GLUCOSE, BUN, CREATININE, CALCIUM, MG, PHOS in the last 168 hours.  Liver Function Tests: No results for input(s): AST, ALT, ALKPHOS, BILITOT, PROT, ALBUMIN in the last 168 hours. CBG: No results for input(s): GLUCAP in the last 168 hours.  Discharge time spent: less than 30 minutes.  Signed: Ezekiel Slocumb, DO [facility] 10/21/2021

## 2021-09-22 NOTE — Progress Notes (Signed)
Patient provided with discharge education and materials, verbalized understanding. IV access and cardiac monitor removed, no issues noted. Patient discharged from unit with all belongings.

## 2021-09-22 NOTE — Care Management Important Message (Signed)
Important Message  Patient Details  Name: Kristin [name] MRN: [id number] Date of Birth: Dec 30, 1929   Medicare Important Message Given:  Yes     Dannette Barbara 09/22/2021, 10:33 AM

## 2021-09-22 NOTE — Progress Notes (Signed)
[facility] Card[facility]    SUBJECTIVE:   -No acute events. -Patient ambulated with rolling walker with physical therapy without dyspnea yesterday and states she feels at about her baseline.  On 2 L of oxygen at baseline -No chest pain.   Vitals:   09/21/21 2321 09/22/21 0504 09/22/21 0739 09/22/21 0914  BP: 105/71 117/69 119/70   Pulse: (!) 59 67 (!) 59   Resp: 18 17 16 17   Temp: 97.6 F (36.4 C) 97.6 F (36.4 C) 97.9 F (36.6 C)   TempSrc:      SpO2: (!) 86% 97% 96%   Weight:  69.8 kg    Height:         Intake/Output Summary (Last 24 hours) at 09/22/2021 1119 Last data filed at 09/22/2021 3382 Gross per 24 hour  Intake 240 ml  Output 2100 ml  Net -1860 ml     PHYSICAL EXAM  General: Pleasant elderly black female, well developed, well nourished, in no acute distress sleeping at incline upon my entry into the room. HEENT:  Normocephalic and atramatic Neck:  No JVD.  Lungs: Clear bilaterally to auscultation. Heart: Irregularly irregular rhythm with controlled rate. Normal S1 and S2 without gallops or murmurs.  Abdomen: Nondistended Appearing Msk:  Normal strength and tone for age. Extremities: No clubbing, cyanosis.  Trace bilateral lower extremity edema. Neuro: Alert and oriented X 3. Psych:  Good affect, responds appropriately   LABS: Basic Metabolic Panel: Recent Labs    09/20/21 0713 09/22/21 0635  NA 137 137  K 5.5* 3.6  CL 100 98  CO2 28 29  GLUCOSE 91 83  BUN 31* 39*  CREATININE 1.34* 1.26*  CALCIUM 9.2 8.9  MG 1.9 2.2    Liver Function Tests: No results for input(s): AST, ALT, ALKPHOS, BILITOT, PROT, ALBUMIN in the last 72 hours. No results for input(s): LIPASE, AMYLASE in the last 72 hours. CBC: No results for input(s): WBC, NEUTROABS, HGB, HCT, MCV, PLT in the last 72 hours.  Cardiac Enzymes: No results for input(s): CKTOTAL, CKMB, CKMBINDEX, TROPONINI in the last 72 hours.  BNP: Invalid input(s): POCBNP D-Dimer: No results for input(s):  DDIMER in the last 72 hours. Hemoglobin A1C: No results for input(s): HGBA1C in the last 72 hours. Fasting Lipid Panel: No results for input(s): CHOL, HDL, LDLCALC, TRIG, CHOLHDL, LDLDIRECT in the last 72 hours. Thyroid Function Tests: No results for input(s): TSH, T4TOTAL, T3FREE, THYROIDAB in the last 72 hours.  Invalid input(s): FREET3 Anemia Panel: No results for input(s): VITAMINB12, FOLATE, FERRITIN, TIBC, IRON, RETICCTPCT in the last 72 hours.  No results found.   Echo previous echo ejection fraction around 60%  TELEMETRY: atrial fibrillation with controlled rate, occasionally V pacing  ASSESSMENT AND PLAN:  Principal Problem:   Acute on chronic diastolic CHF (congestive heart failure) (HCC) Active Problems:   Chronic kidney disease (CKD), stage III (moderate) (HCC)   Chronic pain syndrome   Long term current use of opiate analgesic   GERD (gastroesophageal reflux disease)   Chronic anticoagulation (Eliquis)   PVD (peripheral vascular disease) (HCC)   COPD with acute exacerbation (HCC)    Plan 1 paroxysmal atrial fibrillation   dual-chamber pacemaker 12/2015 -continue Eliquis 2.5 mg twice daily for stroke risk reduction -CHA2DS2-VASc 6  2 HFpEF (50-53% with diastolic dysfunction) -Appears euvolemic on exam today, patient states she ambulated around the nursing station with a walker without dyspnea yesterday.  States her lower extremity swelling has improved to baseline today -Has diuresed well on 40  mg of IV Lasix twice daily x 3.5 days with net output 2.7L since yesterday.  She is down 18 pounds since admission.  -Encourage salt and fluid restriction and patient is agreeable. -Likely okay for discharge from a cardiac standpoint today on 40 mg of PO lasix daily, provided she continues to ambulate well.  She will need follow-up with her regular cardiologist Dr. Saralyn Pilar in 1 to 2 weeks.  3.  CKD -Creatinine at her baseline of 1.4 today  4 GERD by history recommend  continue PPI omeprazole or Protonix  5 PAD s/p stents to superficial femoral and popliteal arteries 09/08/2021 -Continue statin therapy and baby aspirin.  6 COPD, asthma  -Currently on her baseline oxygen of 2 L per nasal cannula.   -Continue inhalers as necessary  7 chronic leg pain  -continue gabapentin  This patient's plan of care was discussed with Dr. Donnelly Angelica and he is in agreement.  Tristan Schroeder, PA-C 09/22/2021 11:19 AM

## 2021-09-24 ENCOUNTER — Ambulatory Visit (INDEPENDENT_AMBULATORY_CARE_PROVIDER_SITE_OTHER): Payer: Medicare HMO

## 2021-09-24 ENCOUNTER — Other Ambulatory Visit: Payer: Self-pay

## 2021-09-24 ENCOUNTER — Ambulatory Visit (INDEPENDENT_AMBULATORY_CARE_PROVIDER_SITE_OTHER): Payer: Medicare HMO | Admitting: Vascular Surgery

## 2021-09-24 ENCOUNTER — Other Ambulatory Visit (INDEPENDENT_AMBULATORY_CARE_PROVIDER_SITE_OTHER): Payer: Self-pay | Admitting: Vascular Surgery

## 2021-09-24 VITALS — BP 136/72 | HR 88 | Ht 67.0 in | Wt 157.0 lb

## 2021-09-24 DIAGNOSIS — I872 Venous insufficiency (chronic) (peripheral): Secondary | ICD-10-CM

## 2021-09-24 DIAGNOSIS — I739 Peripheral vascular disease, unspecified: Secondary | ICD-10-CM | POA: Diagnosis not present

## 2021-09-24 DIAGNOSIS — I7025 Atherosclerosis of native arteries of other extremities with ulceration: Secondary | ICD-10-CM | POA: Diagnosis not present

## 2021-09-24 DIAGNOSIS — I480 Paroxysmal atrial fibrillation: Secondary | ICD-10-CM

## 2021-09-24 DIAGNOSIS — J432 Centrilobular emphysema: Secondary | ICD-10-CM | POA: Diagnosis not present

## 2021-09-24 DIAGNOSIS — I129 Hypertensive chronic kidney disease with stage 1 through stage 4 chronic kidney disease, or unspecified chronic kidney disease: Secondary | ICD-10-CM | POA: Diagnosis not present

## 2021-09-24 DIAGNOSIS — Z95820 Peripheral vascular angioplasty status with implants and grafts: Secondary | ICD-10-CM

## 2021-09-24 DIAGNOSIS — N183 Chronic kidney disease, stage 3 unspecified: Secondary | ICD-10-CM | POA: Diagnosis not present

## 2021-09-24 NOTE — Telephone Encounter (Signed)
Transition Care Management Follow-up Telephone Call Date of discharge and from where: [facility] 09/22/21 How have you been since you were released from the hospital? Feeling much better Any questions or concerns? No  Items Reviewed: Did the pt receive and understand the discharge instructions provided? Yes  Medications obtained and verified? Yes  Other? No  Any new allergies since your discharge? No  Dietary orders reviewed? No Do you have support at home? Yes   Home Care and Equipment/Supplies: Were home health services ordered? no  Functional Questionnaire: (I = Independent and D = Dependent) ADLs:  I  Bathing/Dressing- I  Meal Prep- I  Eating- I  Maintaining continence- I  Transferring/Ambulation- I  Managing Meds- I  Follow up appointments reviewed:  PCP Hospital f/u appt confirmed? Yes  Scheduled to see Dr.K on 09/29/21 @ 11. [facility] f/u appt confirmed? Yes  Scheduled to see [facility] on 09/28/21. Are transportation arrangements needed? No  If their condition worsens, is the pt aware to call PCP or go to the Emergency Dept.? Yes Was the patient provided with contact information for the PCP's office or ED? Yes Was to pt encouraged to call back with questions or concerns? Yes

## 2021-09-25 ENCOUNTER — Telehealth (INDEPENDENT_AMBULATORY_CARE_PROVIDER_SITE_OTHER): Payer: Self-pay

## 2021-09-25 NOTE — Telephone Encounter (Signed)
Ben from [facility] left a voicemail requesting verbal orders 1 time for 1 week and two times for 2 weeks. Suezanne Jacquet was made aware that verbal orders are fine per Eulogio Ditch NP

## 2021-09-26 ENCOUNTER — Encounter (INDEPENDENT_AMBULATORY_CARE_PROVIDER_SITE_OTHER): Payer: Self-pay | Admitting: Vascular Surgery

## 2021-09-26 NOTE — Progress Notes (Signed)
MRN : [id number]  Kristin [name] is a 88 y.o. ([birth year]) female who presents with chief complaint of follow up circulation.  History of Present Illness:   The patient returns to the office for followup and review status post angiogram with intervention on 09/08/2021.  Procedure: Thrombectomy using the [place] thrombectomy catheter right SFA and popliteal           2.  Percutaneous transluminal angioplasty and stent placement to 5 mm right superficial femoral artery and popliteal   The patient notes improvement in the lower extremity symptoms. No interval shortening of the patient's claudication distance or rest pain symptoms. Previous wounds have now healed.  No new ulcers or wounds have occurred since the last visit.  There have been no significant changes to the patient's overall health care.  The patient denies amaurosis fugax or recent TIA symptoms. There are no recent neurological changes noted. The patient denies history of DVT, PE or superficial thrombophlebitis. The patient denies recent episodes of angina or shortness of breath.   ABI's Rt=1.39 and Lt=1.17  (previous ABI's Rt=0.84 and Lt=1.11) Duplex US of the right lower extremity arterial system shows SFA/POP stent is patent without hemodynamically significant change   Current Meds  Medication Sig   acetaminophen (TYLENOL) 500 MG tablet Take 500 mg by mouth in the morning and at bedtime.   albuterol (PROVENTIL) (2.5 MG/3ML) 0.083% nebulizer solution USE 1 VIAL IN NEBULIZER EVERY 8 HOURS AS NEEDED FOR WHEEZING OR SHORTNESS OF BREATH.   apixaban (ELIQUIS) 2.5 MG TABS tablet Take 1 tablet (2.5 mg total) by mouth 2 (two) times daily.   atorvastatin (LIPITOR) 20 MG tablet TAKE 1 TABLET BY MOUTH ONCE DAILY FOR CHOLESTEROL   bisacodyl (DULCOLAX) 5 MG EC tablet Take 2 tablets (10 mg total) by mouth daily as needed for moderate constipation.   BREZTRI AEROSPHERE 160-9-4.8 MCG/ACT AERO Inhale 2 puffs into the lungs 2 (two) times  daily.   Cholecalciferol (VITAMIN D) 2000 units tablet Take 2,000 Units by mouth daily.    furosemide (LASIX) 40 MG tablet Take 1 tablet (40 mg total) by mouth daily.   gabapentin (NEURONTIN) 100 MG capsule TAKE 3 CAPSULES BY MOUTH AT BEDTIME   GNP ASPIRIN LOW DOSE 81 MG EC tablet TAKE 1 TABLET BY MOUTH ONCE DAILY. *SWALLOW WHOLE*   levocetirizine (XYZAL) 5 MG tablet TAKE (1/2) TABLET BY MOUTH EVERY OTHER DAY.   Melatonin 10 MG TABS Take 10 mg by mouth at bedtime.   [START ON 10/24/2021] oxyCODONE (OXY IR/ROXICODONE) 5 MG immediate release tablet Take 2 tablets (10 mg total) by mouth 2 (two) times daily. May also take 1 tablet (5 mg total) at bedtime as needed for severe pain. Must last 30 days..   OXYGEN Inhale 2 L into the lungs daily.   [EXPIRED] predniSONE (DELTASONE) 20 MG tablet Take 2 tablets (40 mg total) by mouth daily for 2 days.   rOPINIRole (REQUIP) 2 MG tablet TAKE 2 TABLETS BY MOUTH AT BEDTIME    Past Medical History:  Diagnosis Date   Acute postoperative pain 02/09/2018   Arrhythmia, sinus node 04/03/2014   Arthritis    Arthritis    Atrial fibrillation (HCC)    Back pain    lower back chronic   Bradycardia    Breast cancer ([place]) 2004   right breast cancer   Cancer ([place]) 2004   rt breast cancer-post lumpectomy- chemo/rad   CHF (congestive heart failure) (HCC)    Chronic back pain  CKD (chronic kidney disease)    COPD (chronic obstructive pulmonary disease) (HCC)    wears O2 at 2L via Flasher at night   COVID-19 06/11/2020   Cystocele    Decubitus ulcers    Dehydration    Gout    Hematuria    Hepatitis C    Hyperlipidemia    Hypertension    Hypomagnesemia 06/25/2015   NSTEMI (non-ST elevated myocardial infarction) ([place]) 04/08/2021   OAB (overactive bladder)    Overactive bladder    Overdose opiate, accidental or unintentional, sequela 10/02/2020   Restless leg    Shoulder pain, bilateral    Urinary frequency    UTI (lower urinary tract infection)    Vaginal  atrophy     Past Surgical History:  Procedure Laterality Date   ABDOMINAL HYSTERECTOMY     BACK SURGERY     BREAST LUMPECTOMY Right 2004   BREAST SURGERY     rt lumpectomy   CARPAL TUNNEL RELEASE     CATARACT EXTRACTION W/PHACO  10/19/2011   Procedure: CATARACT EXTRACTION PHACO AND INTRAOCULAR LENS PLACEMENT ([place]);  Surgeon: Elta Guadeloupe T. Gershon Crane, MD;  Location: [facility];  Service: Ophthalmology;  Laterality: Right;  CDE:10.81   CATARACT EXTRACTION W/PHACO  11/02/2011   Procedure: CATARACT EXTRACTION PHACO AND INTRAOCULAR LENS PLACEMENT ([facility]);  Surgeon: Elta Guadeloupe T. Gershon Crane, MD;  Location: [facility];  Service: Ophthalmology;  Laterality: Left;  CDE 8.60   CHOLECYSTECTOMY     3/18   COLON SURGERY     INSERT / REPLACE / REMOVE PACEMAKER     JOINT REPLACEMENT     bilateral TKA   LOWER EXTREMITY ANGIOGRAPHY Left 10/14/2020   Procedure: LOWER EXTREMITY ANGIOGRAPHY;  Surgeon: Katha Cabal, MD;  Location: [facility];  Service: Cardiovascular;  Laterality: Left;   LOWER EXTREMITY ANGIOGRAPHY Right 09/08/2021   Procedure: LOWER EXTREMITY ANGIOGRAPHY;  Surgeon: Katha Cabal, MD;  Location: [facility];  Service: Cardiovascular;  Laterality: Right;   PACEMAKER INSERTION N/A 12/24/2015   Procedure: INSERTION PACEMAKER;  Surgeon: Isaias Cowman, MD;  Location: [facility];  Service: Cardiovascular;  Laterality: N/A;   ROTATOR CUFF REPAIR     bilateral    Social History Social History   Tobacco Use   Smoking status: Former    Packs/day: 1.25    Years: 40.00    Pack years: 50.00    Types: Cigarettes    Quit date: 12/16/1998    Years since quitting: 22.7   Smokeless tobacco: Never  Vaping Use   Vaping Use: Never used  Substance Use Topics   Alcohol use: No    Alcohol/week: 0.0 standard drinks   Drug use: Yes    Types: Oxycodone    Family History Family History  Problem Relation Age of Onset   Kidney disease Brother    Heart disease Father    Heart disease Mother     Stroke Mother    Cancer Sister    Breast cancer Sister 43   Breast cancer Paternal Aunt    Anesthesia problems Neg Hx    Hypotension Neg Hx    Malignant hyperthermia Neg Hx    Pseudochol deficiency Neg Hx     Allergies  Allergen Reactions   Flexeril [Cyclobenzaprine Hcl]     Severe Rash   Vancomycin Rash    [place] Syndrome     REVIEW OF SYSTEMS (Negative unless checked)  Constitutional: [] Weight loss  [] Fever  [] Chills Cardiac: [] Chest pain   [] Chest pressure   []   Palpitations   [] Shortness of breath when laying flat   [] Shortness of breath with exertion. Vascular:  [x] Pain in legs with walking   [] Pain in legs at rest  [] History of DVT   [] Phlebitis   [x] Swelling in legs   [] Varicose veins   [] Non-healing ulcers Pulmonary:   [] Uses home oxygen   [] Productive cough   [] Hemoptysis   [] Wheeze  [] COPD   [] Asthma Neurologic:  [] Dizziness   [] Seizures   [] History of stroke   [] History of TIA  [] Aphasia   [] Vissual changes   [] Weakness or numbness in arm   [] Weakness or numbness in leg Musculoskeletal:   [] Joint swelling   [] Joint pain   [] Low back pain Hematologic:  [] Easy bruising  [] Easy bleeding   [] Hypercoagulable state   [] Anemic Gastrointestinal:  [] Diarrhea   [] Vomiting  [] Gastroesophageal reflux/heartburn   [] Difficulty swallowing. Genitourinary:  [] Chronic kidney disease   [] Difficult urination  [] Frequent urination   [] Blood in urine Skin:  [] Rashes   [] Ulcers  Psychological:  [] History of anxiety   []  History of major depression.  Physical Examination  Vitals:   09/24/21 1420  BP: 136/72  Pulse: 88  Weight: 157 lb (71.2 kg)  Height: 5\' 7"  (1.702 m)   Body mass index is 24.59 kg/m. Gen: WD/WN, NAD Head: [place]/AT, No temporalis wasting.  Ear/Nose/Throat: Hearing grossly intact, nares w/o erythema or drainage Eyes: PER, EOMI, sclera nonicteric.  Neck: Supple, no masses.  No bruit or JVD.  Pulmonary:  Good air movement, no audible wheezing, no use of accessory  muscles.  Cardiac: RRR, normal S1, S2, no Murmurs. Vascular:  scattered varicosities present bilaterally.  Moderate venous stasis changes to the legs bilaterally.  2+ soft pitting edema  Vessel Right Left  Radial Palpable Palpable  PT Not Palpable Trace Palpable  DP Not Palpable Trace Palpable  Gastrointestinal: soft, non-distended. No guarding/no peritoneal signs.  Musculoskeletal: M/S 5/5 throughout.  No visible deformity.  Neurologic: CN 2-12 intact. Pain and light touch intact in extremities.  Symmetrical.  Speech is fluent. Motor exam as listed above. Psychiatric: Judgment intact, Mood & affect appropriate for pt's clinical situation. Dermatologic: No rashes or ulcers noted.  No changes consistent with cellulitis.   CBC Lab Results  Component Value Date   WBC 5.5 09/19/2021   HGB 10.7 (L) 09/19/2021   HCT 34.3 (L) 09/19/2021   MCV 91.0 09/19/2021   PLT 198 09/19/2021    BMET    Component Value Date/Time   NA 137 09/22/2021 0635   NA 138 09/06/2014 2215   K 3.6 09/22/2021 0635   K 4.4 09/06/2014 2215   CL 98 09/22/2021 0635   CL 106 09/06/2014 2215   CO2 29 09/22/2021 0635   CO2 23 09/06/2014 2215   GLUCOSE 83 09/22/2021 0635   GLUCOSE 92 09/06/2014 2215   BUN 39 (H) 09/22/2021 0635   BUN 25 (H) 09/06/2014 2215   CREATININE 1.26 (H) 09/22/2021 0635   CREATININE 1.62 (H) 09/17/2021 0951   CALCIUM 8.9 09/22/2021 0635   CALCIUM 9.0 09/06/2014 2215   GFRNONAA 41 (L) 09/22/2021 0635   GFRNONAA 45 (L) 10/02/2020 1106   GFRAA 52 (L) 10/02/2020 1106   Estimated Creatinine Clearance: 29.4 mL/min (A) (by C-G formula based on SCr of 1.26 mg/dL (H)).  COAG Lab Results  Component Value Date   INR 1.2 04/08/2021   INR 1.24 12/16/2015   INR 1.07 02/17/2011    Radiology DG Chest 2 View  Result Date: 09/17/2021 CLINICAL DATA:  Postop angiogram/stent of the lower extremity. Worse dyspnea. Normal oxygen. Shortness of breath. Swelling in legs. EXAM: CHEST - 2 VIEW  COMPARISON:  Chest two views 07/18/2021 FINDINGS: Left chest wall cardiac pacer is seen with leads overlying the right atrium and right ventricle. Cardiac silhouette is again moderately enlarged. Mediastinal contours are unchanged. Calcification is again seen within the aortic arch. There is again diffuse chronic bilateral interstitial thickening. There is heterogeneous opacification overlying the inferior right lung at the right hemidiaphragm. This is similar to 07/18/2021. Again appears mildly worsened compared to 04/07/2021. No pleural effusion is seen on lateral view. No pneumothorax. Moderate multilevel degenerative disc changes of the thoracic spine. Right axillary surgical clips. Status post bilateral reverse total shoulder arthroplasty. IMPRESSION:: IMPRESSION: 1. Right hemidiaphragm heterogeneous airspace opacification is similar to 07/18/2021. This may reflect atelectasis scarring, or pneumonia. Again this appears worsened compared to 04/07/2021. 2. Chronic interstitial lung disease. 3.  Aortic Atherosclerosis (ICD10-I70.0). Electronically Signed   By: Yvonne Kendall M.D.   On: 09/17/2021 11:18   PERIPHERAL VASCULAR CATHETERIZATION  Result Date: 09/08/2021 See surgical note for result.  US Venous Img Lower Bilateral  Result Date: 09/19/2021 CLINICAL DATA:  Bilateral leg pain and swelling, initial encounter EXAM: BILATERAL LOWER EXTREMITY VENOUS DOPPLER ULTRASOUND TECHNIQUE: Gray-scale sonography with graded compression, as well as color Doppler and duplex ultrasound were performed to evaluate the lower extremity deep venous systems from the level of the common femoral vein and including the common femoral, femoral, profunda femoral, popliteal and calf veins including the posterior tibial, peroneal and gastrocnemius veins when visible. The superficial great saphenous vein was also interrogated. Spectral Doppler was utilized to evaluate flow at rest and with distal augmentation maneuvers in the common  femoral, femoral and popliteal veins. COMPARISON:  None. FINDINGS: RIGHT LOWER EXTREMITY Common Femoral Vein: No evidence of thrombus. Normal compressibility, respiratory phasicity and response to augmentation. Saphenofemoral Junction: No evidence of thrombus. Normal compressibility and flow on color Doppler imaging. Profunda Femoral Vein: No evidence of thrombus. Normal compressibility and flow on color Doppler imaging. Femoral Vein: No evidence of thrombus. Normal compressibility, respiratory phasicity and response to augmentation. Popliteal Vein: No evidence of thrombus. Normal compressibility, respiratory phasicity and response to augmentation. Calf Veins: No evidence of thrombus. Normal compressibility and flow on color Doppler imaging. Superficial Great Saphenous Vein: No evidence of thrombus. Normal compressibility. Venous Reflux:  None. Other Findings:  Soft tissue edema is noted. LEFT LOWER EXTREMITY Common Femoral Vein: No evidence of thrombus. Normal compressibility, respiratory phasicity and response to augmentation. Saphenofemoral Junction: No evidence of thrombus. Normal compressibility and flow on color Doppler imaging. Profunda Femoral Vein: No evidence of thrombus. Normal compressibility and flow on color Doppler imaging. Femoral Vein: No evidence of thrombus. Normal compressibility, respiratory phasicity and response to augmentation. Popliteal Vein: No evidence of thrombus. Normal compressibility, respiratory phasicity and response to augmentation. Calf Veins: No evidence of thrombus. Normal compressibility and flow on color Doppler imaging. Superficial Great Saphenous Vein: No evidence of thrombus. Normal compressibility. Venous Reflux:  None. Other Findings:  Soft tissue edema is noted. IMPRESSION: No evidence of deep venous thrombosis in either lower extremity. Electronically Signed   By: [name] M.D.   On: 09/19/2021 00:05   DG Chest Port 1 View  Result Date: 09/20/2021 CLINICAL DATA:   Audible wheezing.  Leg swelling and redness EXAM: PORTABLE CHEST 1 VIEW COMPARISON:  Yesterday FINDINGS: Diffuse interstitial opacity, cardiomegaly, and vascular pedicle widening. Enlarged hilar vessels. A few Kerley lines are  present. There is emphysema with fibrotic features by 2020 chest CT. IMPRESSION: Chronic lung disease which could obscure CHF. There is definite cardiomegaly and central vascular congestion. Electronically Signed   By: Jorje Guild M.D.   On: 09/20/2021 07:19   DG Chest Port 1 View  Result Date: 09/19/2021 CLINICAL DATA:  Shortness of breath EXAM: PORTABLE CHEST 1 VIEW COMPARISON:  09/17/2021 FINDINGS: Cardiac shadow is enlarged. Pacing device is again identified. Lungs are well aerated bilaterally. Increasing interstitial markings are noted bilaterally consistent with developing edema. Persistent but improved airspace opacity is noted in right lung base. Postsurgical changes are noted. IMPRESSION: Increasing interstitial change consistent with edema. Improving but persistent right basilar airspace opacity. Electronically Signed   By: [name] M.D.   On: 09/19/2021 03:53   VAS [name] ABI WITH/WO TBI  Result Date: 09/24/2021  LOWER EXTREMITY DOPPLER STUDY Patient Name:  BRANDEN VINE  Date of Exam:   09/24/2021 Medical Rec #: [id number]      Accession #:    [id number] Date of Birth: 12-08-1929       Patient Gender: F Patient Age:   78 years Exam Location:  [facility] Procedure:      VAS [name] ABI WITH/WO TBI Referring Phys: Hortencia Pilar --------------------------------------------------------------------------------  Indications: Peripheral artery disease.  Vascular Interventions: 10/14/2020: PTA of the Left SFA and Popliteal Artery.                         PTA of the Left Posterior tibial Artery and                         Tibioperoneal trunk. PTA and Stent placement Left EIA. Comparison Study: 08/19/2021 Performing Technologist: Almira Coaster RVS  Examination Guidelines:  A complete evaluation includes at minimum, Doppler waveform signals and systolic blood pressure reading at the level of bilateral brachial, anterior tibial, and posterior tibial arteries, when vessel segments are accessible. Bilateral testing is considered an integral part of a complete examination. Photoelectric Plethysmograph (PPG) waveforms and toe systolic pressure readings are included as required and additional duplex testing as needed. Limited examinations for reoccurring indications may be performed as noted.  ABI Findings: +---------+------------------+-----+----------+--------+  Right     Rt Pressure (mmHg) Index Waveform   Comment   +---------+------------------+-----+----------+--------+  ATA       168                1.39  monophasic           +---------+------------------+-----+----------+--------+  PTA       0                  0.00  absent               +---------+------------------+-----+----------+--------+  Great Toe 139                1.15  Normal               +---------+------------------+-----+----------+--------+ +---------+------------------+-----+--------+-------+  Left      Lt Pressure (mmHg) Index Waveform Comment  +---------+------------------+-----+--------+-------+  Brachial  121                                        +---------+------------------+-----+--------+-------+  ATA       120  0.99  biphasic          +---------+------------------+-----+--------+-------+  PTA       142                1.17  biphasic          +---------+------------------+-----+--------+-------+  Great Toe 116                0.96  Normal            +---------+------------------+-----+--------+-------+ +-------+-----------+-----------+------------+------------+  ABI/TBI Today's ABI Today's TBI Previous ABI Previous TBI  +-------+-----------+-----------+------------+------------+  Right   1.39        1.15        .84          .55           +-------+-----------+-----------+------------+------------+   Left    1.17        .96         1.11         .81           +-------+-----------+-----------+------------+------------+  Bilateral TBIs appear increased compared to prior study on 08/19/2021. Left ABIs appear essentially unchanged compared to prior study on 08/19/2021. Rt ABIs appear increased compared to prior study on 08/19/2021.  Summary: Right: Resting right ankle-brachial index is within normal range. No evidence of significant right lower extremity arterial disease. The right toe-brachial index is normal. Left: Resting left ankle-brachial index is within normal range. No evidence of significant left lower extremity arterial disease. The left toe-brachial index is normal.  *See table(s) above for measurements and observations.  Electronically signed by Hortencia Pilar MD on 09/24/2021 at 3:09:19 PM.    Final    VAS [name] LOWER EXTREMITY ARTERIAL DUPLEX  Result Date: 09/24/2021 LOWER EXTREMITY ARTERIAL DUPLEX STUDY Patient Name:  Kristin [name]  Date of Exam:   09/24/2021 Medical Rec #: [id number]      Accession #:    [id number] Date of Birth: 1930/04/14       Patient Gender: F Patient Age:   89 years Exam Location:  [facility] Procedure:      VAS [name] LOWER EXTREMITY ARTERIAL DUPLEX Referring Phys: Hortencia Pilar --------------------------------------------------------------------------------  Indications: Peripheral artery disease.  Vascular Interventions: 10/14/2020: PTA of the Left SFA and Popliteal Artery.                         PTA of the Left Posterior tibial Artery and                         Tibioperoneal trunk. PTA and Stent placement Left EIA. Current ABI:            Rt 1.39, Lt 1.17 Comparison Study: 08/19/2021 Performing Technologist: Almira Coaster RVS  Examination Guidelines: A complete evaluation includes B-mode imaging, spectral Doppler, color Doppler, and power Doppler as needed of all accessible portions of each vessel. Bilateral testing is considered an integral part of a complete  examination. Limited examinations for reoccurring indications may be performed as noted.  +----------+--------+-----+--------+----------+--------+  RIGHT      PSV cm/s Ratio Stenosis Waveform   Comments  +----------+--------+-----+--------+----------+--------+  CFA Distal 149                     biphasic             +----------+--------+-----+--------+----------+--------+  DFA        36  biphasic             +----------+--------+-----+--------+----------+--------+  SFA Prox   110                     biphasic             +----------+--------+-----+--------+----------+--------+  SFA Mid    82                      biphasic             +----------+--------+-----+--------+----------+--------+  SFA Distal 69                      biphasic             +----------+--------+-----+--------+----------+--------+  POP Distal 61                      monophasic           +----------+--------+-----+--------+----------+--------+  ATA Distal 26                      monophasic           +----------+--------+-----+--------+----------+--------+  PTA Distal 0                       Absent               +----------+--------+-----+--------+----------+--------+  Summary: Right: Imaging and Waveforms obtained in the Right Lower Extremity. No flow seen in the Right Posterior Tibial Artery.  See table(s) above for measurements and observations. Electronically signed by Hortencia Pilar MD on 09/24/2021 at 3:11:50 PM.    Final    VAS [name] LOWER EXTREMITY VENOUS (DVT)  Result Date: 09/21/2021  Lower Venous DVT Study Patient Name:  NAYLEA WIGINGTON  Date of Exam:   09/15/2021 Medical Rec #: [id number]      Accession #:    [id number] Date of Birth: 1929-10-23       Patient Gender: F Patient Age:   66 years Exam Location:  [facility] Procedure:      VAS [name] LOWER EXTREMITY VENOUS (DVT) Referring Phys: Eulogio Ditch --------------------------------------------------------------------------------  Indications: Swelling post  arterial intervention right.  Comparison Study: Add on study per FB Performing Technologist: Concha Norway RVT  Examination Guidelines: A complete evaluation includes B-mode imaging, spectral Doppler, color Doppler, and power Doppler as needed of all accessible portions of each vessel. Bilateral testing is considered an integral part of a complete examination. Limited examinations for reoccurring indications may be performed as noted. The reflux portion of the exam is performed with the patient in reverse Trendelenburg.  +---------+---------------+---------+-----------+----------+--------------+  RIGHT     Compressibility Phasicity Spontaneity Properties Thrombus Aging  +---------+---------------+---------+-----------+----------+--------------+  CFV       Full            Yes       Yes                                    +---------+---------------+---------+-----------+----------+--------------+  SFJ       Full            Yes       Yes                                    +---------+---------------+---------+-----------+----------+--------------+  FV Prox   Full                                                             +---------+---------------+---------+-----------+----------+--------------+  FV Mid    Full            Yes       Yes                                    +---------+---------------+---------+-----------+----------+--------------+  FV Distal Full                                                             +---------+---------------+---------+-----------+----------+--------------+  PFV       Full            Yes       Yes                                    +---------+---------------+---------+-----------+----------+--------------+  POP       Full            Yes       Yes                                    +---------+---------------+---------+-----------+----------+--------------+  PTV       Full            Yes       Yes                                     +---------+---------------+---------+-----------+----------+--------------+  PERO      Full            Yes       Yes                                    +---------+---------------+---------+-----------+----------+--------------+  Gastroc   Full            Yes       Yes                                    +---------+---------------+---------+-----------+----------+--------------+  GSV       Full            Yes       Yes                                    +---------+---------------+---------+-----------+----------+--------------+  SSV       Full            Yes  Yes                                    +---------+---------------+---------+-----------+----------+--------------+     Summary: RIGHT: - No evidence of deep vein thrombosis in the lower extremity. No indirect evidence of obstruction proximal to the inguinal ligament. - Added images of CFA,SFA,POP, and PTA checked for flow and shows strong monophasic flow throughout.   *See table(s) above for measurements and observations. Electronically signed by Leotis Pain MD on 09/21/2021 at 10:20:00 AM.    Final      Assessment/Plan 1. Atherosclerosis of native arteries of the extremities with ulceration ([place]) Recommend:  The patient is status post successful angiogram with intervention.  The patient reports that the claudication symptoms and leg pain is essentially gone.   The patient denies lifestyle limiting changes at this point in time.  No further invasive studies, angiography or surgery at this time The patient should continue walking and begin a more formal exercise program.  The patient should continue antiplatelet therapy and aggressive treatment of the lipid abnormalities  Smoking cessation was again discussed  The patient should continue wearing graduated compression socks 10-15 mmHg strength to control the mild edema.  Patient should undergo noninvasive studies as ordered. The patient will follow up with me after the studies.   - VAS [name] ABI  WITH/WO TBI; Future  2. Venous insufficiency of both lower extremities No surgery or intervention at this point in time.    I have had a long discussion with the patient regarding venous insufficiency and why it  causes symptoms. I have discussed with the patient the chronic skin changes that accompany venous insufficiency and the long term sequela such as infection and ulceration.  Patient will begin wearing graduated compression stockings class 1 (20-30 mmHg) or compression wraps on a daily basis a prescription was given. The patient will put the stockings on first thing in the morning and removing them in the evening. The patient is instructed specifically not to sleep in the stockings.    In addition, behavioral modification including several periods of elevation of the lower extremities during the day will be continued. I have demonstrated that proper elevation is a position with the ankles at heart level.  The patient is instructed to begin routine exercise, especially walking on a daily basis  3. AF (paroxysmal atrial fibrillation) (HCC) Continue antiarrhythmia medications as already ordered, these medications have been reviewed and there are no changes at this time.  Continue anticoagulation as ordered by Cardiology Service   4. Benign hypertension with CKD (chronic kidney disease) stage III (HCC) Continue antihypertensive medications as already ordered, these medications have been reviewed and there are no changes at this time.   5. Centrilobular emphysema ([place]) Continue pulmonary medications and aerosols as already ordered, these medications have been reviewed and there are no changes at this time.      Hortencia Pilar, MD  09/26/2021 3:22 PM

## 2021-09-27 NOTE — Progress Notes (Signed)
[name], female    DOB: 1930/04/25, 88 y.o.   MRN: [id number]  HPI  [name] is a 88 y/o female with a history of atrial fibrillation, right breast cancer, chronic back pain, CKD, COPD, gout, hepatitis, hyperlipidemia, HTN, hypomagnesium and chronic heart failure  Echo report from 05/24/20 reviewed and showed an EF of 60-65% along with mild LVH and moderate LAE. Reviewed echo report on 10/29/15 which showed an EF of 55-65% with moderate MR.   Admitted 09/18/21 due to worsening SOB and pedal edema along with weight gain of >20 pounds. Placed on oxygen due to hypoxia. Cardiology consult obtained. Doppler negative for DVT. Initially given IV lasix with transition to oral diuretics. Discharged after 4 days.   Patient presents today for a follow-up visit although hasn't been seen since 2019. She presents with a chief complaint of minimal shortness of breath upon moderate exertion. She describes this as chronic in nature having been present for several years. She has associated fatigue, cough, wheezing and chronic pain along with this. She denies any difficulty sleeping, dizziness, chest pain, pedal edema, palpitations, abdominal distention or weight gain.   Past Medical History:  Diagnosis Date   Acute postoperative pain 02/09/2018   Arrhythmia, sinus node 04/03/2014   Arthritis    Arthritis    Atrial fibrillation (HCC)    Back pain    lower back chronic   Bradycardia    Breast cancer ([place]) 2004   right breast cancer   Cancer ([place]) 2004   rt breast cancer-post lumpectomy- chemo/rad   CHF (congestive heart failure) (HCC)    Chronic back pain    CKD (chronic kidney disease)    COPD (chronic obstructive pulmonary disease) ([place])    wears O2 at 2L via [place] at night   COVID-19 06/11/2020   Cystocele    Decubitus ulcers    Dehydration    Gout    Hematuria    Hepatitis C    Hyperlipidemia    Hypertension    Hypomagnesemia 06/25/2015   NSTEMI (non-ST elevated myocardial infarction)  ([place]) 04/08/2021   OAB (overactive bladder)    Overactive bladder    Overdose opiate, accidental or unintentional, sequela 10/02/2020   Restless leg    Shoulder pain, bilateral    Urinary frequency    UTI (lower urinary tract infection)    Vaginal atrophy    Past Surgical History:  Procedure Laterality Date   ABDOMINAL HYSTERECTOMY     BACK SURGERY     BREAST LUMPECTOMY Right 2004   BREAST SURGERY     rt lumpectomy   CARPAL TUNNEL RELEASE     CATARACT EXTRACTION W/PHACO  10/19/2011   Procedure: CATARACT EXTRACTION PHACO AND INTRAOCULAR LENS PLACEMENT ([place]);  Surgeon: Elta Guadeloupe T. Gershon Crane, MD;  Location: [facility];  Service: Ophthalmology;  Laterality: Right;  CDE:10.81   CATARACT EXTRACTION W/PHACO  11/02/2011   Procedure: CATARACT EXTRACTION PHACO AND INTRAOCULAR LENS PLACEMENT ([facility]);  Surgeon: Elta Guadeloupe T. Gershon Crane, MD;  Location: [facility];  Service: Ophthalmology;  Laterality: Left;  CDE 8.60   CHOLECYSTECTOMY     3/18   COLON SURGERY     INSERT / REPLACE / REMOVE PACEMAKER     JOINT REPLACEMENT     bilateral TKA   LOWER EXTREMITY ANGIOGRAPHY Left 10/14/2020   Procedure: LOWER EXTREMITY ANGIOGRAPHY;  Surgeon: Katha Cabal, MD;  Location: [facility];  Service: Cardiovascular;  Laterality: Left;   LOWER EXTREMITY ANGIOGRAPHY Right 09/08/2021  Procedure: LOWER EXTREMITY ANGIOGRAPHY;  Surgeon: Katha Cabal, MD;  Location: [facility];  Service: Cardiovascular;  Laterality: Right;   PACEMAKER INSERTION N/A 12/24/2015   Procedure: INSERTION PACEMAKER;  Surgeon: Isaias Cowman, MD;  Location: [facility];  Service: Cardiovascular;  Laterality: N/A;   ROTATOR CUFF REPAIR     bilateral   Family History  Problem Relation Age of Onset   Kidney disease Brother    Heart disease Father    Heart disease Mother    Stroke Mother    Cancer Sister    Breast cancer Sister 47   Breast cancer Paternal Aunt    Anesthesia problems Neg Hx    Hypotension Neg Hx    Malignant  hyperthermia Neg Hx    Pseudochol deficiency Neg Hx    Social History   Tobacco Use   Smoking status: Former    Packs/day: 1.25    Years: 40.00    Pack years: 50.00    Types: Cigarettes    Quit date: 12/16/1998    Years since quitting: 22.7   Smokeless tobacco: Never  Substance Use Topics   Alcohol use: No    Alcohol/week: 0.0 standard drinks   Allergies  Allergen Reactions   Flexeril [Cyclobenzaprine Hcl]     Severe Rash   Vancomycin Rash    [place] Syndrome   Prior to Admission medications   Medication Sig Start Date End Date Taking? Authorizing Provider  acetaminophen (TYLENOL) 500 MG tablet Take 500 mg by mouth in the morning and at bedtime.   Yes [provider]  albuterol (PROVENTIL) (2.5 MG/3ML) 0.083% nebulizer solution USE 1 VIAL IN NEBULIZER EVERY 8 HOURS AS NEEDED FOR WHEEZING OR SHORTNESS OF BREATH. 05/21/21  Yes Karamalegos, Devonne Doughty, DO  apixaban (ELIQUIS) 2.5 MG TABS tablet Take 1 tablet (2.5 mg total) by mouth 2 (two) times daily. 04/09/21  Yes Wieting, [name], MD  atorvastatin (LIPITOR) 20 MG tablet TAKE 1 TABLET BY MOUTH ONCE DAILY FOR CHOLESTEROL 02/17/21  Yes Karamalegos, Devonne Doughty, DO  bisacodyl (DULCOLAX) 5 MG EC tablet Take 2 tablets (10 mg total) by mouth daily as needed for moderate constipation. 07/04/20  Yes Karamalegos, Alexander J, DO  BREZTRI AEROSPHERE 160-9-4.8 MCG/ACT AERO Inhale 2 puffs into the lungs 2 (two) times daily. 07/07/21  Yes Karamalegos, Devonne Doughty, DO  Cholecalciferol (VITAMIN D) 2000 units tablet Take 2,000 Units by mouth daily.    Yes [provider]  furosemide (LASIX) 40 MG tablet Take 1 tablet (40 mg total) by mouth daily. 09/23/21  Yes Nicole Kindred A, DO  gabapentin (NEURONTIN) 100 MG capsule TAKE 3 CAPSULES BY MOUTH AT BEDTIME 06/29/21  Yes Karamalegos, Alexander J, DO  GNP ASPIRIN LOW DOSE 81 MG EC tablet TAKE 1 TABLET BY MOUTH ONCE DAILY. *SWALLOW WHOLE* 08/18/21  Yes Schnier, Dolores Lory, MD   levocetirizine (XYZAL) 5 MG tablet TAKE (1/2) TABLET BY MOUTH EVERY OTHER DAY. 06/19/21  Yes Karamalegos, Devonne Doughty, DO  Melatonin 10 MG TABS Take 10 mg by mouth at bedtime.   Yes [provider]  oxyCODONE (OXY IR/ROXICODONE) 5 MG immediate release tablet Take 2 tablets (10 mg total) by mouth 2 (two) times daily. May also take 1 tablet (5 mg total) at bedtime as needed for severe pain. Must last 30 days.. 10/24/21 11/23/21 Yes Milinda Pointer, MD  OXYGEN Inhale 2 L into the lungs daily.   Yes [provider]  rOPINIRole (REQUIP) 2 MG tablet TAKE 2 TABLETS BY MOUTH  AT BEDTIME 04/23/21  Yes Olin Hauser, DO    Review of Systems  Constitutional:  Positive for fatigue. Negative for appetite change.  HENT:  Negative for congestion, postnasal drip and sore throat.   Eyes: Negative.   Respiratory:  Positive for cough (in the mornings), shortness of breath (walking up stairs and walking long distantces) and wheezing. Negative for chest tightness.   Cardiovascular:  Negative for chest pain, palpitations and leg swelling.  Gastrointestinal:  Negative for abdominal distention and abdominal pain.  Endocrine: Negative.   Genitourinary: Negative.   Musculoskeletal:  Positive for arthralgias (both shoulders) and back pain. Negative for neck stiffness.  Skin: Negative.   Allergic/Immunologic: Negative.   Neurological:  Negative for dizziness and light-headedness.  Hematological:  Negative for adenopathy. Bruises/bleeds easily.  Psychiatric/Behavioral:  Negative for dysphoric mood and sleep disturbance (sleeping on 1 pillow; wearing oxygen @ 2L at bedtime). The patient is not nervous/anxious.    Vitals:   09/28/21 1433  BP: 124/79  Pulse: 70  Resp: 16  SpO2: 95%  Weight: 157 lb (71.2 kg)  Height: 5\' 1"  (1.549 m)   Wt Readings from Last 3 Encounters:  09/28/21 157 lb (71.2 kg)  09/24/21 157 lb (71.2 kg)  09/22/21 153 lb 12.8 oz (69.8 kg)   Lab Results  Component  Value Date   CREATININE 1.26 (H) 09/22/2021   CREATININE 1.34 (H) 09/20/2021   CREATININE 1.36 (H) 09/19/2021   Physical Exam Vitals and nursing note reviewed. Exam conducted with a chaperone present (son).  Constitutional:      Appearance: She is well-developed.  HENT:     Head: Normocephalic and atraumatic.  Neck:     Vascular: No JVD.  Cardiovascular:     Rate and Rhythm: Normal rate and regular rhythm.  Pulmonary:     Effort: Pulmonary effort is normal.     Breath sounds: Wheezing (few expiratory) present. No rales.  Abdominal:     General: There is no distension.     Palpations: Abdomen is soft.     Tenderness: There is no abdominal tenderness.  Musculoskeletal:        General: No tenderness.     Cervical back: Normal range of motion and neck supple.     Right lower leg: Edema (1+ pitting) present.     Left lower leg: Edema (1+ pitting) present.  Skin:    General: Skin is warm and dry.  Neurological:     Mental Status: She is alert and oriented to person, place, and time.  Psychiatric:        Behavior: Behavior normal.        Thought Content: Thought content normal.   Assessment & Plan:  1: Chronic heart failure with preserved ejection fraction with functional changes (LVH/LAE)- - NYHA class II - Euvolemic today - weighing daily; Reminded to call for an overnight weight gain of >2 pounds or a weekly weight gain of >5 pounds  - adding salt to her food and occasionally uses Mrs. Dash. Discussed the importance of using Mrs. Dash for seasoning in place of using salt.  - considering adding entresto/ SGLT2 if BP continues to be good - Wearing oxygen at 2L around the clock except when she leaves the house; has portable oxygen that she left in the car  - wearing compression socks - BNP 09/20/21 was 1234.8  2: HTN-  - BP looks good - saw PCP [facility]) 09/15/21; returns tomorrow - BMP 09/22/21 reviewed and showed sodium 137,  potassium 3.6, creatinine 1.26 and GFR  41  3: PAF- - Saw cardiologist Margarito Courser) 09/07/21 - on apixaban  4: PVD- - saw vascular (Schnier) 09/24/21   Patient did not bring in a medication list.  Return in 2 months, sooner if needed

## 2021-09-28 ENCOUNTER — Ambulatory Visit: Payer: Medicare PPO | Attending: Family | Admitting: Family

## 2021-09-28 ENCOUNTER — Encounter: Payer: Self-pay | Admitting: Family

## 2021-09-28 ENCOUNTER — Other Ambulatory Visit: Payer: Self-pay

## 2021-09-28 VITALS — BP 124/79 | HR 70 | Resp 16 | Ht 61.0 in | Wt 157.0 lb

## 2021-09-28 DIAGNOSIS — Z9981 Dependence on supplemental oxygen: Secondary | ICD-10-CM | POA: Insufficient documentation

## 2021-09-28 DIAGNOSIS — Z7901 Long term (current) use of anticoagulants: Secondary | ICD-10-CM | POA: Insufficient documentation

## 2021-09-28 DIAGNOSIS — I129 Hypertensive chronic kidney disease with stage 1 through stage 4 chronic kidney disease, or unspecified chronic kidney disease: Secondary | ICD-10-CM

## 2021-09-28 DIAGNOSIS — G8929 Other chronic pain: Secondary | ICD-10-CM | POA: Diagnosis not present

## 2021-09-28 DIAGNOSIS — Z853 Personal history of malignant neoplasm of breast: Secondary | ICD-10-CM | POA: Insufficient documentation

## 2021-09-28 DIAGNOSIS — E785 Hyperlipidemia, unspecified: Secondary | ICD-10-CM | POA: Insufficient documentation

## 2021-09-28 DIAGNOSIS — I5032 Chronic diastolic (congestive) heart failure: Secondary | ICD-10-CM | POA: Diagnosis not present

## 2021-09-28 DIAGNOSIS — I480 Paroxysmal atrial fibrillation: Secondary | ICD-10-CM | POA: Insufficient documentation

## 2021-09-28 DIAGNOSIS — N189 Chronic kidney disease, unspecified: Secondary | ICD-10-CM | POA: Diagnosis not present

## 2021-09-28 DIAGNOSIS — K759 Inflammatory liver disease, unspecified: Secondary | ICD-10-CM | POA: Insufficient documentation

## 2021-09-28 DIAGNOSIS — I130 Hypertensive heart and chronic kidney disease with heart failure and stage 1 through stage 4 chronic kidney disease, or unspecified chronic kidney disease: Secondary | ICD-10-CM | POA: Insufficient documentation

## 2021-09-28 DIAGNOSIS — N183 Chronic kidney disease, stage 3 unspecified: Secondary | ICD-10-CM

## 2021-09-28 DIAGNOSIS — M25512 Pain in left shoulder: Secondary | ICD-10-CM | POA: Diagnosis not present

## 2021-09-28 DIAGNOSIS — J449 Chronic obstructive pulmonary disease, unspecified: Secondary | ICD-10-CM | POA: Insufficient documentation

## 2021-09-28 DIAGNOSIS — R0902 Hypoxemia: Secondary | ICD-10-CM | POA: Diagnosis not present

## 2021-09-28 DIAGNOSIS — I739 Peripheral vascular disease, unspecified: Secondary | ICD-10-CM | POA: Diagnosis not present

## 2021-09-28 DIAGNOSIS — M109 Gout, unspecified: Secondary | ICD-10-CM | POA: Insufficient documentation

## 2021-09-28 DIAGNOSIS — M25511 Pain in right shoulder: Secondary | ICD-10-CM | POA: Diagnosis not present

## 2021-09-28 DIAGNOSIS — M549 Dorsalgia, unspecified: Secondary | ICD-10-CM | POA: Diagnosis not present

## 2021-09-28 NOTE — Patient Instructions (Signed)
Continue weighing daily and call for an overnight weight gain of 3 pounds or more or a weekly weight gain of more than 5 pounds.  ° °The [facility] will be moving around the corner to [id number] mid-February. Our phone number will remain the same ° °

## 2021-09-29 ENCOUNTER — Encounter: Payer: Self-pay | Admitting: Family Medicine

## 2021-09-29 ENCOUNTER — Ambulatory Visit (INDEPENDENT_AMBULATORY_CARE_PROVIDER_SITE_OTHER): Payer: Medicare PPO | Admitting: Family Medicine

## 2021-09-29 VITALS — BP 115/53 | HR 93 | Ht 61.0 in | Wt 162.2 lb

## 2021-09-29 DIAGNOSIS — I739 Peripheral vascular disease, unspecified: Secondary | ICD-10-CM

## 2021-09-29 DIAGNOSIS — I5032 Chronic diastolic (congestive) heart failure: Secondary | ICD-10-CM

## 2021-09-29 DIAGNOSIS — I872 Venous insufficiency (chronic) (peripheral): Secondary | ICD-10-CM | POA: Diagnosis not present

## 2021-09-29 NOTE — Progress Notes (Signed)
Subjective:    [name], female    DOB: 26-Jun-1930, 88 y.o.   MRN: [id number]  Kristin [name] is a 88 y.o. female presenting on 09/29/2021 for Hospitalization Follow-up   HPI  Discussion with family and Granddaughter on phone  HOSPITAL FOLLOW-UP VISIT  Hospital/Location: [facility] Date of Admission: 09/18/21 Date of Discharge: 09/23/21 Transitions of care telephone call: 09/21/21 completed by Kirke Shaggy LPN  Reason for Admission: HFpEF Acute exacerbation / Edema  - Hospital H&P and Discharge Summary have been reviewed - Patient presents today 6 days after recent hospitalization. Brief summary of recent course, patient had symptoms of leg swelling and dyspnea due to heart failure exacerbation, hospitalized,  treated with diuretic therapy.  Of note, recently hospitalized for lower extremity procedure with angiogram and stent placement, with successful results, has seen outpatient vascular Dr Erven Colla on 09/25/21 with good results. No further surgical intervention.  Last seen by [facility], seen yesterday 09/28/21, and determined that fluid wt due to CHF exacerbation recent reviewed, she was treated with diuretic and back to her dry baseline weight. Given instructions on avoiding triggers, and has had review of labs with mild low K 3.6 recently and Cr 1.26 mild elevated. Requested repeat.  HH PT to get strength back.  - Today reports overall has done well after discharge. Symptoms of edema have improved. Dyspnea improved. Wt up to 162 by our scale was 157 with Cardiologist recently   I have reviewed the discharge medication list, and have reconciled the current and discharge medications today.   Current Outpatient Medications:    acetaminophen (TYLENOL) 500 MG tablet, Take 500 mg by mouth in the morning and at bedtime., Disp: , Rfl:    albuterol (PROVENTIL) (2.5 MG/3ML) 0.083% nebulizer solution, USE 1 VIAL IN NEBULIZER EVERY 8 HOURS AS NEEDED FOR  WHEEZING OR SHORTNESS OF BREATH., Disp: 75 mL, Rfl: 3   apixaban (ELIQUIS) 2.5 MG TABS tablet, Take 1 tablet (2.5 mg total) by mouth 2 (two) times daily., Disp: 60 tablet, Rfl: 0   atorvastatin (LIPITOR) 20 MG tablet, TAKE 1 TABLET BY MOUTH ONCE DAILY FOR CHOLESTEROL, Disp: 30 tablet, Rfl: 11   bisacodyl (DULCOLAX) 5 MG EC tablet, Take 2 tablets (10 mg total) by mouth daily as needed for moderate constipation., Disp: 180 tablet, Rfl: 1   BREZTRI AEROSPHERE 160-9-4.8 MCG/ACT AERO, Inhale 2 puffs into the lungs 2 (two) times daily., Disp: 10.7 g, Rfl: 5   Cholecalciferol (VITAMIN D) 2000 units tablet, Take 2,000 Units by mouth daily. , Disp: , Rfl:    furosemide (LASIX) 40 MG tablet, Take 1 tablet (40 mg total) by mouth daily., Disp: 30 tablet, Rfl: 1   gabapentin (NEURONTIN) 100 MG capsule, TAKE 3 CAPSULES BY MOUTH AT BEDTIME, Disp: 270 capsule, Rfl: 0   GNP ASPIRIN LOW DOSE 81 MG EC tablet, TAKE 1 TABLET BY MOUTH ONCE DAILY. *SWALLOW WHOLE*, Disp: 150 tablet, Rfl: 11   levocetirizine (XYZAL) 5 MG tablet, TAKE (1/2) TABLET BY MOUTH EVERY OTHER DAY., Disp: 8 tablet, Rfl: 9   Melatonin 10 MG TABS, Take 10 mg by mouth at bedtime., Disp: , Rfl:    [START ON 10/24/2021] oxyCODONE (OXY IR/ROXICODONE) 5 MG immediate release tablet, Take 2 tablets (10 mg total) by mouth 2 (two) times daily. May also take 1 tablet (5 mg total) at bedtime as needed for severe pain. Must last 30 days.., Disp: 150 tablet, Rfl: 0   OXYGEN, Inhale 2  L into the lungs daily., Disp: , Rfl:    rOPINIRole (REQUIP) 2 MG tablet, TAKE 2 TABLETS BY MOUTH AT BEDTIME, Disp: 180 tablet, Rfl: 1  ------------------------------------------------------------------------- Social History   Tobacco Use   Smoking status: Former    Packs/day: 1.25    Years: 40.00    Pack years: 50.00    Types: Cigarettes    Quit date: 12/16/1998    Years since quitting: 22.8   Smokeless tobacco: Never  Vaping Use   Vaping Use: Never used  Substance Use Topics    Alcohol use: No    Alcohol/week: 0.0 standard drinks   Drug use: Yes    Types: Oxycodone    Review of Systems Per HPI unless specifically indicated above     Objective:    BP (!) 115/53    Pulse 93    Ht 5\' 1"  (1.549 m)    Wt 162 lb 3.2 oz (73.6 kg)    SpO2 97%    BMI 30.65 kg/m   Wt Readings from Last 3 Encounters:  09/29/21 162 lb 3.2 oz (73.6 kg)  09/28/21 157 lb (71.2 kg)  09/24/21 157 lb (71.2 kg)    Physical Exam Vitals and nursing note reviewed.  Constitutional:      General: She is not in acute distress.    Appearance: She is well-developed. She is not diaphoretic.     Comments: Well-appearing elderly 88 year female, comfortable, cooperative  HENT:     Head: Normocephalic and atraumatic.  Eyes:     General:        Right eye: No discharge.        Left eye: No discharge.     Conjunctiva/sclera: Conjunctivae normal.  Neck:     Thyroid: No thyromegaly.  Cardiovascular:     Rate and Rhythm: Normal rate and regular rhythm.     Heart sounds: Normal heart sounds. No murmur heard. Pulmonary:     Effort: Pulmonary effort is normal. No respiratory distress.     Breath sounds: Normal breath sounds. No wheezing or rales.  Musculoskeletal:        General: Normal range of motion.     Cervical back: Normal range of motion and neck supple.     Right lower leg: Edema (+1 to trace pitting edema bilateral, stable to improved.) present.     Left lower leg: Edema present.     Comments: Legs non tender today  Lymphadenopathy:     Cervical: No cervical adenopathy.  Skin:    General: Skin is warm and dry.     Findings: No erythema or rash.  Neurological:     Mental Status: She is alert and oriented to person, place, and time.  Psychiatric:        Behavior: Behavior normal.     Comments: Well groomed, good eye contact, normal speech and thoughts      Results for orders placed or performed during the hospital encounter of 09/18/21  Resp Panel by RT-PCR (Flu A&B, Covid)  Nasopharyngeal Swab   Specimen: Nasopharyngeal Swab; Nasopharyngeal(NP) swabs in vial transport medium  Result Value Ref Range   SARS Coronavirus 2 by RT PCR NEGATIVE NEGATIVE   Influenza A by PCR NEGATIVE NEGATIVE   Influenza B by PCR NEGATIVE NEGATIVE  Lactic acid, plasma  Result Value Ref Range   Lactic Acid, Venous 2.6 (HH) 0.5 - 1.9 mmol/L  Lactic acid, plasma  Result Value Ref Range   Lactic Acid, Venous 1.5 0.5 - 1.9  mmol/L  CBC  Result Value Ref Range   WBC 5.7 4.0 - 10.5 K/uL   RBC 3.89 3.87 - 5.11 MIL/uL   Hemoglobin 11.2 (L) 12.0 - 15.0 g/dL   HCT 37.2 36.0 - 46.0 %   MCV 95.6 80.0 - 100.0 fL   MCH 28.8 26.0 - 34.0 pg   MCHC 30.1 30.0 - 36.0 g/dL   RDW 15.8 (H) 11.5 - 15.5 %   Platelets 209 150 - 400 K/uL   nRBC 0.0 0.0 - 0.2 %  Basic metabolic panel  Result Value Ref Range   Sodium 137 135 - 145 mmol/L   Potassium 4.4 3.5 - 5.1 mmol/L   Chloride 101 98 - 111 mmol/L   CO2 24 22 - 32 mmol/L   Glucose, Bld 114 (H) 70 - 99 mg/dL   BUN 32 (H) 8 - 23 mg/dL   Creatinine, Ser 1.44 (H) 0.44 - 1.00 mg/dL   Calcium 9.0 8.9 - 10.3 mg/dL   GFR, Estimated 35 (L) >60 mL/min   Anion gap 12 5 - 15  CBC with Differential/Platelet  Result Value Ref Range   WBC 5.3 4.0 - 10.5 K/uL   RBC 3.92 3.87 - 5.11 MIL/uL   Hemoglobin 11.3 (L) 12.0 - 15.0 g/dL   HCT 37.6 36.0 - 46.0 %   MCV 95.9 80.0 - 100.0 fL   MCH 28.8 26.0 - 34.0 pg   MCHC 30.1 30.0 - 36.0 g/dL   RDW 15.8 (H) 11.5 - 15.5 %   Platelets 204 150 - 400 K/uL   nRBC 0.0 0.0 - 0.2 %   Neutrophils Relative % 49 %   Neutro Abs 2.6 1.7 - 7.7 K/uL   Lymphocytes Relative 34 %   Lymphs Abs 1.8 0.7 - 4.0 K/uL   Monocytes Relative 8 %   Monocytes Absolute 0.5 0.1 - 1.0 K/uL   Eosinophils Relative 8 %   Eosinophils Absolute 0.4 0.0 - 0.5 K/uL   Basophils Relative 1 %   Basophils Absolute 0.1 0.0 - 0.1 K/uL   Immature Granulocytes 0 %   Abs Immature Granulocytes 0.02 0.00 - 0.07 K/uL  Brain natriuretic peptide  Result  Value Ref Range   B Natriuretic Peptide 1,107.8 (H) 0.0 - 100.0 pg/mL  CK  Result Value Ref Range   Total CK 173 38 - 234 U/L  Procalcitonin - Baseline  Result Value Ref Range   Procalcitonin 0.15 ng/mL  Basic metabolic panel  Result Value Ref Range   Sodium 136 135 - 145 mmol/L   Potassium 4.2 3.5 - 5.1 mmol/L   Chloride 102 98 - 111 mmol/L   CO2 27 22 - 32 mmol/L   Glucose, Bld 78 70 - 99 mg/dL   BUN 28 (H) 8 - 23 mg/dL   Creatinine, Ser 1.36 (H) 0.44 - 1.00 mg/dL   Calcium 8.7 (L) 8.9 - 10.3 mg/dL   GFR, Estimated 37 (L) >60 mL/min   Anion gap 7 5 - 15  CBC  Result Value Ref Range   WBC 5.5 4.0 - 10.5 K/uL   RBC 3.77 (L) 3.87 - 5.11 MIL/uL   Hemoglobin 10.7 (L) 12.0 - 15.0 g/dL   HCT 34.3 (L) 36.0 - 46.0 %   MCV 91.0 80.0 - 100.0 fL   MCH 28.4 26.0 - 34.0 pg   MCHC 31.2 30.0 - 36.0 g/dL   RDW 15.6 (H) 11.5 - 15.5 %   Platelets 198 150 - 400 K/uL   nRBC 0.0 0.0 -  0.2 %  Basic metabolic panel  Result Value Ref Range   Sodium 137 135 - 145 mmol/L   Potassium 5.5 (H) 3.5 - 5.1 mmol/L   Chloride 100 98 - 111 mmol/L   CO2 28 22 - 32 mmol/L   Glucose, Bld 91 70 - 99 mg/dL   BUN 31 (H) 8 - 23 mg/dL   Creatinine, Ser 1.34 (H) 0.44 - 1.00 mg/dL   Calcium 9.2 8.9 - 10.3 mg/dL   GFR, Estimated 38 (L) >60 mL/min   Anion gap 9 5 - 15  Magnesium  Result Value Ref Range   Magnesium 1.9 1.7 - 2.4 mg/dL  Brain natriuretic peptide  Result Value Ref Range   B Natriuretic Peptide 1,234.8 (H) 0.0 - 100.0 pg/mL  Basic metabolic panel  Result Value Ref Range   Sodium 137 135 - 145 mmol/L   Potassium 3.6 3.5 - 5.1 mmol/L   Chloride 98 98 - 111 mmol/L   CO2 29 22 - 32 mmol/L   Glucose, Bld 83 70 - 99 mg/dL   BUN 39 (H) 8 - 23 mg/dL   Creatinine, Ser 1.26 (H) 0.44 - 1.00 mg/dL   Calcium 8.9 8.9 - 10.3 mg/dL   GFR, Estimated 41 (L) >60 mL/min   Anion gap 10 5 - 15  Magnesium  Result Value Ref Range   Magnesium 2.2 1.7 - 2.4 mg/dL  Troponin I (High Sensitivity)  Result Value  Ref Range   Troponin I (High Sensitivity) 25 (H) <18 ng/L      Assessment & Plan:   Problem List Items Addressed This Visit     Venous insufficiency of both lower extremities   Chronic diastolic CHF (congestive heart failure) (HCC) - Primary   Other Visit Diagnoses     PAD (peripheral artery disease) (HCC)           Mild volume up today Recently Euvolemic baseline dry wt suspected 155-157 range Today up a few lbs but only mild inc swelling Concern with history of too aggressive lasix in past diuresis, and then opposite problem under diuresed cause CHF exacerbation HFpEF  Reviewed hospital course  Note recently improved PAD claudication s/p stent from vascular, overall reassuring now from vascular point of view.  Reviewed [facility] Clinic Cardiology note from yesterday. Encouraged patient to maintain goal   We want to keep around dry weight goal 155 - 157 lbs  Keep on Furosemide 40mg  daily - as long as at or above weight, if BELOW 155, consistently 1-2 days in a row, can HOLD the Furosemide Lasix.  If persistently elevated above 162-165 lbs, then may need to contact the [facility] again to discuss, they can sometimes offer acute treatment with IV medication in their office if need to avoid hospital.  No orders of the defined types were placed in this encounter.   Follow up plan: Return if symptoms worsen or fail to improve, for keep apt in March.   [name], [facility] 09/29/2021, 11:36 AM

## 2021-09-29 NOTE — Patient Instructions (Addendum)
Thank you for coming to the office today.  Based on weight and fluid  We want to keep around dry weight goal 155 - 157 lbs  Keep on Furosemide 40mg  daily - as long as at or above weight, if BELOW 155, consistently 1-2 days in a row, can HOLD the Furosemide Lasix.  If persistently elevated above 162-165 lbs, then may need to contact the [facility] again to discuss, they can sometimes offer acute treatment with IV medication in their office if need to avoid hospital.  Glad the circulation is improved overall from the vascular doctors  Please schedule a Follow-up Appointment to: Return if symptoms worsen or fail to improve, for keep apt in March.  If you have any other questions or concerns, please feel free to call the office or send a message through [place]. You may also schedule an earlier appointment if necessary.  Additionally, you may be receiving a survey about your experience at our office within a few days to 1 week by e-mail or mail. We value your feedback.  [name], DO [place]

## 2021-10-03 ENCOUNTER — Encounter (INDEPENDENT_AMBULATORY_CARE_PROVIDER_SITE_OTHER): Payer: Self-pay | Admitting: Nurse Practitioner

## 2021-10-03 NOTE — Progress Notes (Signed)
Subjective:    [name], female    DOB: Sep 09, 1929, 88 y.o.   MRN: [id number] Chief Complaint  Patient presents with   Follow-up    Bilateral leg swelling    Leg raise is a 88 year old female that presents today following recent intervention on her right lower extremity.  The patient's family was concerned due to swelling postintervention.  The patient does have known lymphedema and has not been in wraps or utilizing compression for a little bit of time.  Noninvasive studies today do not show evidence of new DVT.   Review of Systems  Cardiovascular:  Positive for leg swelling.  All other systems reviewed and are negative.     Objective:   Physical Exam Vitals reviewed.  HENT:     Head: Normocephalic.  Cardiovascular:     Rate and Rhythm: Normal rate.  Pulmonary:     Effort: Pulmonary effort is normal.  Skin:    General: Skin is warm and dry.  Neurological:     Mental Status: She is alert and oriented to person, place, and time.  Psychiatric:        Mood and Affect: Mood normal.        Behavior: Behavior normal.        Thought Content: Thought content normal.        Judgment: Judgment normal.    BP 131/78 (BP Location: Left Arm)    Pulse 78    Resp 16    Wt 171 lb 6.4 oz (77.7 kg)    BMI 26.85 kg/m   Past Medical History:  Diagnosis Date   Acute postoperative pain 02/09/2018   Arrhythmia, sinus node 04/03/2014   Arthritis    Arthritis    Atrial fibrillation (HCC)    Back pain    lower back chronic   Bradycardia    Breast cancer ([place]) 2004   right breast cancer   Cancer ([place]) 2004   rt breast cancer-post lumpectomy- chemo/rad   CHF (congestive heart failure) (HCC)    Chronic back pain    CKD (chronic kidney disease)    COPD (chronic obstructive pulmonary disease) ([place])    wears O2 at 2L via [place] at night   COVID-19 06/11/2020   Cystocele    Decubitus ulcers    Dehydration    Gout    Hematuria    Hepatitis C    Hyperlipidemia     Hypertension    Hypomagnesemia 06/25/2015   NSTEMI (non-ST elevated myocardial infarction) ([place]) 04/08/2021   OAB (overactive bladder)    Overactive bladder    Overdose opiate, accidental or unintentional, sequela 10/02/2020   Restless leg    Shoulder pain, bilateral    Urinary frequency    UTI (lower urinary tract infection)    Vaginal atrophy     Social History   Socioeconomic History   Marital status: Single    Spouse name: Not on file   Number of children: 2   Years of education: 12   Highest education level: 12th grade  Occupational History   Occupation: retired  Tobacco Use   Smoking status: Former    Packs/day: 1.25    Years: 40.00    Pack years: 50.00    Types: Cigarettes    Quit date: 12/16/1998    Years since quitting: 22.8   Smokeless tobacco: Never  Vaping Use   Vaping Use: Never used  Substance and Sexual Activity   Alcohol use: No  Alcohol/week: 0.0 standard drinks   Drug use: Yes    Types: Oxycodone   Sexual activity: Not Currently    Birth control/protection: Surgical  Other Topics Concern   Not on file  Social History Narrative   Not on file   Social Determinants of Health   Financial Resource Strain: Not on file  Food Insecurity: No Food Insecurity   Worried About Running Out of Food in the Last Year: Never true   Ran Out of Food in the Last Year: Never true  Transportation Needs: No Transportation Needs   Lack of Transportation (Medical): No   Lack of Transportation (Non-Medical): No  Physical Activity: Not on file  Stress: Not on file  Social Connections: Not on file  Intimate Partner Violence: Not on file    Past Surgical History:  Procedure Laterality Date   ABDOMINAL HYSTERECTOMY     BACK SURGERY     BREAST LUMPECTOMY Right 2004   BREAST SURGERY     rt lumpectomy   CARPAL TUNNEL RELEASE     CATARACT EXTRACTION W/PHACO  10/19/2011   Procedure: CATARACT EXTRACTION PHACO AND INTRAOCULAR LENS PLACEMENT (IOC);  Surgeon: Elta Guadeloupe T.  Gershon Crane, MD;  Location: [facility];  Service: Ophthalmology;  Laterality: Right;  CDE:10.81   CATARACT EXTRACTION W/PHACO  11/02/2011   Procedure: CATARACT EXTRACTION PHACO AND INTRAOCULAR LENS PLACEMENT (IOC);  Surgeon: Elta Guadeloupe T. Gershon Crane, MD;  Location: [facility];  Service: Ophthalmology;  Laterality: Left;  CDE 8.60   CHOLECYSTECTOMY     3/18   COLON SURGERY     INSERT / REPLACE / REMOVE PACEMAKER     JOINT REPLACEMENT     bilateral TKA   LOWER EXTREMITY ANGIOGRAPHY Left 10/14/2020   Procedure: LOWER EXTREMITY ANGIOGRAPHY;  Surgeon: Katha Cabal, MD;  Location: [place] [facility];  Service: Cardiovascular;  Laterality: Left;   LOWER EXTREMITY ANGIOGRAPHY Right 09/08/2021   Procedure: LOWER EXTREMITY ANGIOGRAPHY;  Surgeon: Katha Cabal, MD;  Location: [place] [facility];  Service: Cardiovascular;  Laterality: Right;   PACEMAKER INSERTION N/A 12/24/2015   Procedure: INSERTION PACEMAKER;  Surgeon: Isaias Cowman, MD;  Location: [facility];  Service: Cardiovascular;  Laterality: N/A;   ROTATOR CUFF REPAIR     bilateral    Family History  Problem Relation Age of Onset   Kidney disease Brother    Heart disease Father    Heart disease Mother    Stroke Mother    Cancer Sister    Breast cancer Sister 76   Breast cancer Paternal Aunt    Anesthesia problems Neg Hx    Hypotension Neg Hx    Malignant hyperthermia Neg Hx    Pseudochol deficiency Neg Hx     Allergies  Allergen Reactions   Flexeril [Cyclobenzaprine Hcl]     Severe Rash   Vancomycin Rash    [place] Syndrome    CBC Latest Ref Rng & Units 09/19/2021 09/18/2021 09/18/2021  WBC 4.0 - 10.5 K/uL 5.5 5.3 5.7  Hemoglobin 12.0 - 15.0 g/dL 10.7(L) 11.3(L) 11.2(L)  Hematocrit 36.0 - 46.0 % 34.3(L) 37.6 37.2  Platelets 150 - 400 K/uL 198 204 209      CMP     Component Value Date/Time   NA 137 09/22/2021 0635   NA 138 09/06/2014 2215   K 3.6 09/22/2021 0635   K 4.4 09/06/2014 2215   CL 98 09/22/2021 0635   CL 106  09/06/2014 2215   CO2 29 09/22/2021 0635   CO2 23  09/06/2014 2215   GLUCOSE 83 09/22/2021 0635   GLUCOSE 92 09/06/2014 2215   BUN 39 (H) 09/22/2021 0635   BUN 25 (H) 09/06/2014 2215   CREATININE 1.26 (H) 09/22/2021 0635   CREATININE 1.62 (H) 09/17/2021 0951   CALCIUM 8.9 09/22/2021 0635   CALCIUM 9.0 09/06/2014 2215   PROT 7.0 11/07/2020 2002   PROT 7.1 07/10/2014 1219   ALBUMIN 3.7 11/07/2020 2002   ALBUMIN 3.2 (L) 07/10/2014 1219   AST 21 11/07/2020 2002   AST 26 07/10/2014 1219   ALT 16 11/07/2020 2002   ALT 15 07/10/2014 1219   ALKPHOS 55 11/07/2020 2002   ALKPHOS 69 07/10/2014 1219   BILITOT 0.6 11/07/2020 2002   BILITOT 0.4 07/10/2014 1219   GFRNONAA 41 (L) 09/22/2021 0635   GFRNONAA 45 (L) 10/02/2020 1106   GFRAA 52 (L) 10/02/2020 1106     VAS [name] ABI WITH/WO TBI  Result Date: 09/24/2021  LOWER EXTREMITY DOPPLER STUDY Patient Name:  Kristin [name]  Date of Exam:   09/24/2021 Medical Rec #: [id number]      Accession #:    [id number] Date of Birth: 10/23/1929       Patient Gender: F Patient Age:   55 years Exam Location:  [facility] Procedure:      VAS [name] ABI WITH/WO TBI Referring Phys: Hortencia Pilar --------------------------------------------------------------------------------  Indications: Peripheral artery disease.  Vascular Interventions: 10/14/2020: PTA of the Left SFA and Popliteal Artery.                         PTA of the Left Posterior tibial Artery and                         Tibioperoneal trunk. PTA and Stent placement Left EIA. Comparison Study: 08/19/2021 Performing Technologist: Almira Coaster RVS  Examination Guidelines: A complete evaluation includes at minimum, Doppler waveform signals and systolic blood pressure reading at the level of bilateral brachial, anterior tibial, and posterior tibial arteries, when vessel segments are accessible. Bilateral testing is considered an integral part of a complete examination. Photoelectric Plethysmograph (PPG)  waveforms and toe systolic pressure readings are included as required and additional duplex testing as needed. Limited examinations for reoccurring indications may be performed as noted.  ABI Findings: +---------+------------------+-----+----------+--------+  Right     Rt Pressure (mmHg) Index Waveform   Comment   +---------+------------------+-----+----------+--------+  ATA       168                1.39  monophasic           +---------+------------------+-----+----------+--------+  PTA       0                  0.00  absent               +---------+------------------+-----+----------+--------+  Great Toe 139                1.15  Normal               +---------+------------------+-----+----------+--------+ +---------+------------------+-----+--------+-------+  Left      Lt Pressure (mmHg) Index Waveform Comment  +---------+------------------+-----+--------+-------+  Brachial  121                                        +---------+------------------+-----+--------+-------+  ATA       120                0.99  biphasic          +---------+------------------+-----+--------+-------+  PTA       142                1.17  biphasic          +---------+------------------+-----+--------+-------+  Great Toe 116                0.96  Normal            +---------+------------------+-----+--------+-------+ +-------+-----------+-----------+------------+------------+  ABI/TBI Today's ABI Today's TBI Previous ABI Previous TBI  +-------+-----------+-----------+------------+------------+  Right   1.39        1.15        .84          .55           +-------+-----------+-----------+------------+------------+  Left    1.17        .96         1.11         .81           +-------+-----------+-----------+------------+------------+  Bilateral TBIs appear increased compared to prior study on 08/19/2021. Left ABIs appear essentially unchanged compared to prior study on 08/19/2021. Rt ABIs appear increased compared to prior study on 08/19/2021.   Summary: Right: Resting right ankle-brachial index is within normal range. No evidence of significant right lower extremity arterial disease. The right toe-brachial index is normal. Left: Resting left ankle-brachial index is within normal range. No evidence of significant left lower extremity arterial disease. The left toe-brachial index is normal.  *See table(s) above for measurements and observations.  Electronically signed by Hortencia Pilar MD on 09/24/2021 at 3:09:19 PM.    Final    VAS [name] ABI WITH/WO TBI  Result Date: 08/27/2021  LOWER EXTREMITY DOPPLER STUDY Patient Name:  Kristin [name]  Date of Exam:   08/19/2021 Medical Rec #: [id number]      Accession #:    [id number] Date of Birth: 1930/01/09       Patient Gender: F Patient Age:   14 years Exam Location:  [facility] Procedure:      VAS [name] ABI WITH/WO TBI Referring Phys: Hortencia Pilar --------------------------------------------------------------------------------  Indications: Peripheral artery disease.  Vascular Interventions: 10/14/2020: PTA of the Left SFA and Popliteal Artery.                         PTA of the Left Posterior tibial Artery and                         Tibioperoneal trunk. PTA and Stent placement Left EIA. Comparison Study: 06/04/2021 Performing Technologist: Almira Coaster RVS  Examination Guidelines: A complete evaluation includes at minimum, Doppler waveform signals and systolic blood pressure reading at the level of bilateral brachial, anterior tibial, and posterior tibial arteries, when vessel segments are accessible. Bilateral testing is considered an integral part of a complete examination. Photoelectric Plethysmograph (PPG) waveforms and toe systolic pressure readings are included as required and additional duplex testing as needed. Limited examinations for reoccurring indications may be performed as noted.  ABI Findings: +---------+------------------+-----+----------+-------------+  Right     Rt Pressure  (mmHg) Index Waveform   Comment        +---------+------------------+-----+----------+-------------+  Brachial  Breast Cancer  +---------+------------------+-----+----------+-------------+  ATA       114                      monophasic .84            +---------+------------------+-----+----------+-------------+  PTA       106                0.78  monophasic                +---------+------------------+-----+----------+-------------+  Great Toe 74                 0.54  Abnormal                  +---------+------------------+-----+----------+-------------+ +---------+------------------+-----+----------+-------+  Left      Lt Pressure (mmHg) Index Waveform   Comment  +---------+------------------+-----+----------+-------+  Brachial  136                                          +---------+------------------+-----+----------+-------+  ATA       130                      monophasic .96      +---------+------------------+-----+----------+-------+  PTA       150                1.10  monophasic          +---------+------------------+-----+----------+-------+  Great Toe 109                0.80  Normal              +---------+------------------+-----+----------+-------+ +-------+-----------+-----------+------------+------------+  ABI/TBI Today's ABI Today's TBI Previous ABI Previous TBI  +-------+-----------+-----------+------------+------------+  Right   .84         .55         .88          .58           +-------+-----------+-----------+------------+------------+  Left    1.11        .81         1.28         .47           +-------+-----------+-----------+------------+------------+  Bilateral ABIs appear essentially unchanged compared to prior study on 06/04/2021. Right TBIs appear increased compared to prior study on 06/04/2021. Left TBIs appear essentially unchanged compared to prior study on 06/04/2021.  Summary: Right: Resting right ankle-brachial index indicates mild right lower  extremity arterial disease. The right toe-brachial index is abnormal. Left: Resting left ankle-brachial index is within normal range. No evidence of significant left lower extremity arterial disease. The left toe-brachial index is normal.  *See table(s) above for measurements and observations.  Electronically signed by Hortencia Pilar MD on 08/27/2021 at 5:06:19 PM.    Final        Assessment & Plan:   1. Atherosclerosis of native arteries of the extremities with ulceration ([place]) Patient's swelling is likely an exacerbation of her lymphedema as well as the recent intervention took place.  The patient's bilateral feet are much warmer.  Limited studies today show strong monophasic flow.  The patient will follow-up in 2 weeks for more formal studies to evaluate for her perfusion postintervention.  2. Lymphedema We will have the patient placed back in [place] wraps to help control her  lymphedema as well as the postprocedural swelling.  No DVT is present.  We will reevaluate progress when she returns in several weeks.   Current Outpatient Medications on File Prior to Visit  Medication Sig Dispense Refill   acetaminophen (TYLENOL) 500 MG tablet Take 500 mg by mouth in the morning and at bedtime.     albuterol (PROVENTIL) (2.5 MG/3ML) 0.083% nebulizer solution USE 1 VIAL IN NEBULIZER EVERY 8 HOURS AS NEEDED FOR WHEEZING OR SHORTNESS OF BREATH. 75 mL 3   apixaban (ELIQUIS) 2.5 MG TABS tablet Take 1 tablet (2.5 mg total) by mouth 2 (two) times daily. 60 tablet 0   atorvastatin (LIPITOR) 20 MG tablet TAKE 1 TABLET BY MOUTH ONCE DAILY FOR CHOLESTEROL 30 tablet 11   bisacodyl (DULCOLAX) 5 MG EC tablet Take 2 tablets (10 mg total) by mouth daily as needed for moderate constipation. 180 tablet 1   BREZTRI AEROSPHERE 160-9-4.8 MCG/ACT AERO Inhale 2 puffs into the lungs 2 (two) times daily. 10.7 g 5   Cholecalciferol (VITAMIN D) 2000 units tablet Take 2,000 Units by mouth daily.      gabapentin (NEURONTIN) 100 MG  capsule TAKE 3 CAPSULES BY MOUTH AT BEDTIME 270 capsule 0   GNP ASPIRIN LOW DOSE 81 MG EC tablet TAKE 1 TABLET BY MOUTH ONCE DAILY. *SWALLOW WHOLE* 150 tablet 11   levocetirizine (XYZAL) 5 MG tablet TAKE (1/2) TABLET BY MOUTH EVERY OTHER DAY. 8 tablet 9   Melatonin 10 MG TABS Take 10 mg by mouth at bedtime.     [START ON 10/24/2021] oxyCODONE (OXY IR/ROXICODONE) 5 MG immediate release tablet Take 2 tablets (10 mg total) by mouth 2 (two) times daily. May also take 1 tablet (5 mg total) at bedtime as needed for severe pain. Must last 30 days.. 150 tablet 0   OXYGEN Inhale 2 L into the lungs daily.     rOPINIRole (REQUIP) 2 MG tablet TAKE 2 TABLETS BY MOUTH AT BEDTIME 180 tablet 1   No current facility-administered medications on file prior to visit.    There are no Patient Instructions on file for this visit. No follow-ups on file.   Kris Hartmann, NP

## 2021-10-05 DIAGNOSIS — I495 Sick sinus syndrome: Secondary | ICD-10-CM | POA: Diagnosis not present

## 2021-10-05 DIAGNOSIS — J9611 Chronic respiratory failure with hypoxia: Secondary | ICD-10-CM | POA: Diagnosis not present

## 2021-10-05 DIAGNOSIS — Z950 Presence of cardiac pacemaker: Secondary | ICD-10-CM | POA: Diagnosis not present

## 2021-10-05 DIAGNOSIS — N1832 Chronic kidney disease, stage 3b: Secondary | ICD-10-CM | POA: Diagnosis not present

## 2021-10-05 DIAGNOSIS — I5032 Chronic diastolic (congestive) heart failure: Secondary | ICD-10-CM | POA: Diagnosis not present

## 2021-10-05 DIAGNOSIS — Z7901 Long term (current) use of anticoagulants: Secondary | ICD-10-CM | POA: Diagnosis not present

## 2021-10-05 DIAGNOSIS — I10 Essential (primary) hypertension: Secondary | ICD-10-CM | POA: Diagnosis not present

## 2021-10-05 DIAGNOSIS — I480 Paroxysmal atrial fibrillation: Secondary | ICD-10-CM | POA: Diagnosis not present

## 2021-10-08 ENCOUNTER — Telehealth (INDEPENDENT_AMBULATORY_CARE_PROVIDER_SITE_OTHER): Payer: Self-pay

## 2021-10-08 NOTE — Telephone Encounter (Signed)
Kristin [name] called from [facility] care wanting to know could he get a verbal order for the pt for PT for 2x2 and 2x1 for continued strength and mobility.

## 2021-10-08 NOTE — Telephone Encounter (Signed)
Kristin [name] from [facility] called and left a VM on the nurses line wanting to know  if the pt is still having her legs wrapped per the pts chart she was told at her last office visit to continue wearing graduated compression socks 10-84mm Hg strength to control mild edema.The pt will also begin wearing compression stockings grade 20-30 mm Hg. I called and made the Home care nurse [facility]   aware

## 2021-10-08 NOTE — Telephone Encounter (Signed)
That's fine

## 2021-10-09 ENCOUNTER — Other Ambulatory Visit: Payer: Self-pay

## 2021-10-09 NOTE — Patient Outreach (Signed)
[facility]) Care Management  10/09/2021  DHANI IMEL Mar 22, 1930 [id number]   Telephone Assessment   Unsuccessful quarterly outreach attempt to patient.     Plan: RN CM will make quarterly outreach attempt to patient within the month of May.   Enzo Montgomery, RN,BSN,CCM [facility] Telephonic Care Management Coordinator Direct Phone: [phone] Toll Free: [phone] Fax: [phone]

## 2021-10-09 NOTE — Telephone Encounter (Signed)
I called Mr. Colin Rhein and left a VM making him aware that the verbal order he needed was approved.

## 2021-10-13 DIAGNOSIS — J45998 Other asthma: Secondary | ICD-10-CM | POA: Diagnosis not present

## 2021-10-13 DIAGNOSIS — I130 Hypertensive heart and chronic kidney disease with heart failure and stage 1 through stage 4 chronic kidney disease, or unspecified chronic kidney disease: Secondary | ICD-10-CM | POA: Diagnosis not present

## 2021-10-13 DIAGNOSIS — N189 Chronic kidney disease, unspecified: Secondary | ICD-10-CM | POA: Diagnosis not present

## 2021-10-13 DIAGNOSIS — I890 Lymphedema, not elsewhere classified: Secondary | ICD-10-CM | POA: Diagnosis not present

## 2021-10-13 DIAGNOSIS — L97321 Non-pressure chronic ulcer of left ankle limited to breakdown of skin: Secondary | ICD-10-CM | POA: Diagnosis not present

## 2021-10-13 DIAGNOSIS — I480 Paroxysmal atrial fibrillation: Secondary | ICD-10-CM | POA: Diagnosis not present

## 2021-10-13 DIAGNOSIS — J449 Chronic obstructive pulmonary disease, unspecified: Secondary | ICD-10-CM | POA: Diagnosis not present

## 2021-10-13 DIAGNOSIS — I5033 Acute on chronic diastolic (congestive) heart failure: Secondary | ICD-10-CM | POA: Diagnosis not present

## 2021-10-13 DIAGNOSIS — M1030 Gout due to renal impairment, unspecified site: Secondary | ICD-10-CM | POA: Diagnosis not present

## 2021-10-13 DIAGNOSIS — I872 Venous insufficiency (chronic) (peripheral): Secondary | ICD-10-CM | POA: Diagnosis not present

## 2021-10-14 DIAGNOSIS — I480 Paroxysmal atrial fibrillation: Secondary | ICD-10-CM | POA: Diagnosis not present

## 2021-10-15 ENCOUNTER — Other Ambulatory Visit: Payer: Self-pay

## 2021-10-15 ENCOUNTER — Ambulatory Visit (INDEPENDENT_AMBULATORY_CARE_PROVIDER_SITE_OTHER): Payer: Medicare HMO

## 2021-10-15 ENCOUNTER — Encounter (INDEPENDENT_AMBULATORY_CARE_PROVIDER_SITE_OTHER): Payer: Self-pay | Admitting: Nurse Practitioner

## 2021-10-15 ENCOUNTER — Ambulatory Visit (INDEPENDENT_AMBULATORY_CARE_PROVIDER_SITE_OTHER): Payer: Medicare HMO | Admitting: Nurse Practitioner

## 2021-10-15 ENCOUNTER — Other Ambulatory Visit (INDEPENDENT_AMBULATORY_CARE_PROVIDER_SITE_OTHER): Payer: Self-pay | Admitting: Vascular Surgery

## 2021-10-15 VITALS — BP 107/68 | HR 90 | Ht 67.0 in | Wt 157.0 lb

## 2021-10-15 DIAGNOSIS — I7025 Atherosclerosis of native arteries of other extremities with ulceration: Secondary | ICD-10-CM

## 2021-10-15 DIAGNOSIS — I872 Venous insufficiency (chronic) (peripheral): Secondary | ICD-10-CM | POA: Diagnosis not present

## 2021-10-15 DIAGNOSIS — I5033 Acute on chronic diastolic (congestive) heart failure: Secondary | ICD-10-CM | POA: Diagnosis not present

## 2021-10-15 DIAGNOSIS — I890 Lymphedema, not elsewhere classified: Secondary | ICD-10-CM

## 2021-10-15 DIAGNOSIS — N189 Chronic kidney disease, unspecified: Secondary | ICD-10-CM | POA: Diagnosis not present

## 2021-10-15 DIAGNOSIS — I130 Hypertensive heart and chronic kidney disease with heart failure and stage 1 through stage 4 chronic kidney disease, or unspecified chronic kidney disease: Secondary | ICD-10-CM | POA: Diagnosis not present

## 2021-10-15 DIAGNOSIS — I480 Paroxysmal atrial fibrillation: Secondary | ICD-10-CM | POA: Diagnosis not present

## 2021-10-15 DIAGNOSIS — L97321 Non-pressure chronic ulcer of left ankle limited to breakdown of skin: Secondary | ICD-10-CM | POA: Diagnosis not present

## 2021-10-15 DIAGNOSIS — J449 Chronic obstructive pulmonary disease, unspecified: Secondary | ICD-10-CM | POA: Diagnosis not present

## 2021-10-15 DIAGNOSIS — M1030 Gout due to renal impairment, unspecified site: Secondary | ICD-10-CM | POA: Diagnosis not present

## 2021-10-15 MED ORDER — DICLOFENAC SODIUM 1 % EX GEL: 4.0000 g | Freq: Two times a day (BID) | CUTANEOUS | 1 refills | Status: DC | PRN

## 2021-10-19 ENCOUNTER — Other Ambulatory Visit: Payer: Self-pay | Admitting: Family Medicine

## 2021-10-19 DIAGNOSIS — M1030 Gout due to renal impairment, unspecified site: Secondary | ICD-10-CM | POA: Diagnosis not present

## 2021-10-19 DIAGNOSIS — I5033 Acute on chronic diastolic (congestive) heart failure: Secondary | ICD-10-CM | POA: Diagnosis not present

## 2021-10-19 DIAGNOSIS — I890 Lymphedema, not elsewhere classified: Secondary | ICD-10-CM | POA: Diagnosis not present

## 2021-10-19 DIAGNOSIS — N189 Chronic kidney disease, unspecified: Secondary | ICD-10-CM | POA: Diagnosis not present

## 2021-10-19 DIAGNOSIS — L97321 Non-pressure chronic ulcer of left ankle limited to breakdown of skin: Secondary | ICD-10-CM | POA: Diagnosis not present

## 2021-10-19 DIAGNOSIS — I872 Venous insufficiency (chronic) (peripheral): Secondary | ICD-10-CM | POA: Diagnosis not present

## 2021-10-19 DIAGNOSIS — I480 Paroxysmal atrial fibrillation: Secondary | ICD-10-CM | POA: Diagnosis not present

## 2021-10-19 DIAGNOSIS — J449 Chronic obstructive pulmonary disease, unspecified: Secondary | ICD-10-CM | POA: Diagnosis not present

## 2021-10-19 DIAGNOSIS — I130 Hypertensive heart and chronic kidney disease with heart failure and stage 1 through stage 4 chronic kidney disease, or unspecified chronic kidney disease: Secondary | ICD-10-CM | POA: Diagnosis not present

## 2021-10-19 DIAGNOSIS — J432 Centrilobular emphysema: Secondary | ICD-10-CM

## 2021-10-20 ENCOUNTER — Other Ambulatory Visit: Payer: Self-pay | Admitting: Family Medicine

## 2021-10-20 DIAGNOSIS — G2581 Restless legs syndrome: Secondary | ICD-10-CM

## 2021-10-20 NOTE — Telephone Encounter (Signed)
Requested Prescriptions  Pending Prescriptions Disp Refills   albuterol (PROVENTIL) (2.5 MG/3ML) 0.083% nebulizer solution [Pharmacy Med Name: ALBUTEROL SUL 2.5 MG/3 ML SOLN] 75 mL 0    Sig: USE 1 VIAL IN NEBULIZER EVERY 8 HOURS AS NEEDED FOR WHEEZING OR SHORTNESS OF BREATH.     Pulmonology:  Beta Agonists 2 Passed - 10/19/2021  4:46 PM      Passed - Last BP in normal range    BP Readings from Last 1 Encounters:  10/15/21 107/68         Passed - Last Heart Rate in normal range    Pulse Readings from Last 1 Encounters:  10/15/21 90         Passed - Valid encounter within last 12 months    Recent Outpatient Visits          3 weeks ago Chronic diastolic CHF (congestive heart failure) [facility])   [place], DO   1 month ago Shortness of breath   [place], DO   2 months ago Venous insufficiency of both lower extremities   [place], DO   3 months ago Centrilobular emphysema [facility])   [place], DO   5 months ago Chronic diastolic CHF (congestive heart failure) [facility])   [facility] Olin Hauser, DO      Future Appointments            In 2 weeks [place], Devonne Doughty, DO [facility], [facility]

## 2021-10-21 DIAGNOSIS — M1030 Gout due to renal impairment, unspecified site: Secondary | ICD-10-CM | POA: Diagnosis not present

## 2021-10-21 DIAGNOSIS — N189 Chronic kidney disease, unspecified: Secondary | ICD-10-CM | POA: Diagnosis not present

## 2021-10-21 DIAGNOSIS — I890 Lymphedema, not elsewhere classified: Secondary | ICD-10-CM | POA: Diagnosis not present

## 2021-10-21 DIAGNOSIS — I480 Paroxysmal atrial fibrillation: Secondary | ICD-10-CM | POA: Diagnosis not present

## 2021-10-21 DIAGNOSIS — I872 Venous insufficiency (chronic) (peripheral): Secondary | ICD-10-CM | POA: Diagnosis not present

## 2021-10-21 DIAGNOSIS — I5033 Acute on chronic diastolic (congestive) heart failure: Secondary | ICD-10-CM | POA: Diagnosis not present

## 2021-10-21 DIAGNOSIS — L97321 Non-pressure chronic ulcer of left ankle limited to breakdown of skin: Secondary | ICD-10-CM | POA: Diagnosis not present

## 2021-10-21 DIAGNOSIS — I130 Hypertensive heart and chronic kidney disease with heart failure and stage 1 through stage 4 chronic kidney disease, or unspecified chronic kidney disease: Secondary | ICD-10-CM | POA: Diagnosis not present

## 2021-10-21 DIAGNOSIS — J449 Chronic obstructive pulmonary disease, unspecified: Secondary | ICD-10-CM | POA: Diagnosis not present

## 2021-10-21 NOTE — Telephone Encounter (Signed)
Requested Prescriptions  ?Pending Prescriptions Disp Refills  ?? rOPINIRole (REQUIP) 2 MG tablet [Pharmacy Med Name: ROPINIROLE HCL 2 MG TABLET] 180 tablet 1  ?  Sig: TAKE 2 TABLETS BY MOUTH AT BEDTIME  ?  ? Neurology:  Parkinsonian Agents Passed - 10/20/2021 11:00 AM  ?  ?  Passed - Last BP in normal range  ?  BP Readings from Last 1 Encounters:  ?10/15/21 107/68  ?   ?  ?  Passed - Last Heart Rate in normal range  ?  Pulse Readings from Last 1 Encounters:  ?10/15/21 90  ?   ?  ?  Passed - Valid encounter within last 12 months  ?  Recent Outpatient Visits   ?      ? 3 weeks ago Chronic diastolic CHF (congestive heart failure) ([place])  ? [place], DO  ? 1 month ago Shortness of breath  ? [place], DO  ? 2 months ago Venous insufficiency of both lower extremities  ? [place], DO  ? 3 months ago Centrilobular emphysema [facility])  ? [place], DO  ? 6 months ago Chronic diastolic CHF (congestive heart failure) ([place])  ? [place], DO  ?  ?  ?Future Appointments   ?        ? In 2 weeks Parks Ranger Devonne Doughty, DO [facility], [place]  ?  ? ?  ?  ?  ? ? ?

## 2021-10-22 DIAGNOSIS — Z7901 Long term (current) use of anticoagulants: Secondary | ICD-10-CM | POA: Diagnosis not present

## 2021-10-22 DIAGNOSIS — M79675 Pain in left toe(s): Secondary | ICD-10-CM | POA: Diagnosis not present

## 2021-10-22 DIAGNOSIS — I83029 Varicose veins of left lower extremity with ulcer of unspecified site: Secondary | ICD-10-CM | POA: Diagnosis not present

## 2021-10-22 DIAGNOSIS — M79674 Pain in right toe(s): Secondary | ICD-10-CM | POA: Diagnosis not present

## 2021-10-22 DIAGNOSIS — L97929 Non-pressure chronic ulcer of unspecified part of left lower leg with unspecified severity: Secondary | ICD-10-CM | POA: Diagnosis not present

## 2021-10-22 DIAGNOSIS — I872 Venous insufficiency (chronic) (peripheral): Secondary | ICD-10-CM | POA: Diagnosis not present

## 2021-10-22 DIAGNOSIS — B351 Tinea unguium: Secondary | ICD-10-CM | POA: Diagnosis not present

## 2021-10-23 DIAGNOSIS — I890 Lymphedema, not elsewhere classified: Secondary | ICD-10-CM | POA: Diagnosis not present

## 2021-10-23 DIAGNOSIS — I872 Venous insufficiency (chronic) (peripheral): Secondary | ICD-10-CM | POA: Diagnosis not present

## 2021-10-23 DIAGNOSIS — I480 Paroxysmal atrial fibrillation: Secondary | ICD-10-CM | POA: Diagnosis not present

## 2021-10-23 DIAGNOSIS — L97321 Non-pressure chronic ulcer of left ankle limited to breakdown of skin: Secondary | ICD-10-CM | POA: Diagnosis not present

## 2021-10-23 DIAGNOSIS — J449 Chronic obstructive pulmonary disease, unspecified: Secondary | ICD-10-CM | POA: Diagnosis not present

## 2021-10-23 DIAGNOSIS — I130 Hypertensive heart and chronic kidney disease with heart failure and stage 1 through stage 4 chronic kidney disease, or unspecified chronic kidney disease: Secondary | ICD-10-CM | POA: Diagnosis not present

## 2021-10-23 DIAGNOSIS — N189 Chronic kidney disease, unspecified: Secondary | ICD-10-CM | POA: Diagnosis not present

## 2021-10-23 DIAGNOSIS — M1030 Gout due to renal impairment, unspecified site: Secondary | ICD-10-CM | POA: Diagnosis not present

## 2021-10-23 DIAGNOSIS — I5033 Acute on chronic diastolic (congestive) heart failure: Secondary | ICD-10-CM | POA: Diagnosis not present

## 2021-10-26 ENCOUNTER — Encounter (INDEPENDENT_AMBULATORY_CARE_PROVIDER_SITE_OTHER): Payer: Self-pay | Admitting: Nurse Practitioner

## 2021-10-26 NOTE — Progress Notes (Signed)
Subjective:    [name], female    DOB: May 10, 1930, 88 y.o.   MRN: [id number] Chief Complaint  Patient presents with   Follow-up    Follow in 3 mo with ABI     [name] is a 88 year old female that presents today with concern for pain in her lower extremities.  The patient has a longstanding history of arthritis as well as lower back issues.  She has had notable issues with pain in her lower extremities prior to any lower extremity interventions.  Today she notes that the pain is not necessarily claudication or rest pain but more so in a that happens constantly.  The patient does have chronic pain management however she notes that her pain medications have been reduced recently.  She denies any open wounds or ulcerations.  Today ABIs are noncompressible however she has some mildly hyperemic waveforms with strong monophasic tibial artery waveforms.  She also has good toe waveforms bilaterally.   Review of Systems  Cardiovascular:  Positive for leg swelling.  All other systems reviewed and are negative.     Objective:   Physical Exam Vitals reviewed.  HENT:     Head: Normocephalic.  Cardiovascular:     Rate and Rhythm: Normal rate.     Pulses:          Dorsalis pedis pulses are 1+ on the right side and detected w/ Doppler on the left side.       Posterior tibial pulses are detected w/ Doppler on the right side and detected w/ Doppler on the left side.  Pulmonary:     Effort: Pulmonary effort is normal.  Skin:    General: Skin is warm and dry.  Neurological:     Mental Status: She is alert and oriented to person, place, and time.  Psychiatric:        Mood and Affect: Mood normal.        Behavior: Behavior normal.        Thought Content: Thought content normal.        Judgment: Judgment normal.    BP 107/68    Pulse 90    Ht 5\' 7"  (1.702 m)    Wt 157 lb (71.2 kg)    BMI 24.59 kg/m   Past Medical History:  Diagnosis Date   Acute postoperative pain  02/09/2018   Arrhythmia, sinus node 04/03/2014   Arthritis    Arthritis    Atrial fibrillation (HCC)    Back pain    lower back chronic   Bradycardia    Breast cancer ([place]) 2004   right breast cancer   Cancer ([place]) 2004   rt breast cancer-post lumpectomy- chemo/rad   CHF (congestive heart failure) (HCC)    Chronic back pain    CKD (chronic kidney disease)    COPD (chronic obstructive pulmonary disease) ([place])    wears O2 at 2L via [place] at night   COVID-19 06/11/2020   Cystocele    Decubitus ulcers    Dehydration    Gout    Hematuria    Hepatitis C    Hyperlipidemia    Hypertension    Hypomagnesemia 06/25/2015   NSTEMI (non-ST elevated myocardial infarction) ([place]) 04/08/2021   OAB (overactive bladder)    Overactive bladder    Overdose opiate, accidental or unintentional, sequela 10/02/2020   Restless leg    Shoulder pain, bilateral    Urinary frequency    UTI (lower urinary tract infection)  Vaginal atrophy     Social History   Socioeconomic History   Marital status: Single    Spouse name: Not on file   Number of children: 2   Years of education: 71   Highest education level: 12th grade  Occupational History   Occupation: retired  Tobacco Use   Smoking status: Former    Packs/day: 1.25    Years: 40.00    Pack years: 50.00    Types: Cigarettes    Quit date: 12/16/1998    Years since quitting: 22.8   Smokeless tobacco: Never  Vaping Use   Vaping Use: Never used  Substance and Sexual Activity   Alcohol use: No    Alcohol/week: 0.0 standard drinks   Drug use: Yes    Types: Oxycodone   Sexual activity: Not Currently    Birth control/protection: Surgical  Other Topics Concern   Not on file  Social History Narrative   Not on file   Social Determinants of Health   Financial Resource Strain: Not on file  Food Insecurity: No Food Insecurity   Worried About Running Out of Food in the Last Year: Never true   [place] in the Last Year: Never true   Transportation Needs: No Transportation Needs   Lack of Transportation (Medical): No   Lack of Transportation (Non-Medical): No  Physical Activity: Not on file  Stress: Not on file  Social Connections: Not on file  Intimate Partner Violence: Not on file    Past Surgical History:  Procedure Laterality Date   ABDOMINAL HYSTERECTOMY     BACK SURGERY     BREAST LUMPECTOMY Right 2004   BREAST SURGERY     rt lumpectomy   CARPAL TUNNEL RELEASE     CATARACT EXTRACTION W/PHACO  10/19/2011   Procedure: CATARACT EXTRACTION PHACO AND INTRAOCULAR LENS PLACEMENT (IOC);  Surgeon: Elta Guadeloupe T. Gershon Crane, MD;  Location: [facility];  Service: Ophthalmology;  Laterality: Right;  CDE:10.81   CATARACT EXTRACTION W/PHACO  11/02/2011   Procedure: CATARACT EXTRACTION PHACO AND INTRAOCULAR LENS PLACEMENT (IOC);  Surgeon: Elta Guadeloupe T. Gershon Crane, MD;  Location: [facility];  Service: Ophthalmology;  Laterality: Left;  CDE 8.60   CHOLECYSTECTOMY     3/18   COLON SURGERY     INSERT / REPLACE / REMOVE PACEMAKER     JOINT REPLACEMENT     bilateral TKA   LOWER EXTREMITY ANGIOGRAPHY Left 10/14/2020   Procedure: LOWER EXTREMITY ANGIOGRAPHY;  Surgeon: Katha Cabal, MD;  Location: [facility];  Service: Cardiovascular;  Laterality: Left;   LOWER EXTREMITY ANGIOGRAPHY Right 09/08/2021   Procedure: LOWER EXTREMITY ANGIOGRAPHY;  Surgeon: Katha Cabal, MD;  Location: [facility];  Service: Cardiovascular;  Laterality: Right;   PACEMAKER INSERTION N/A 12/24/2015   Procedure: INSERTION PACEMAKER;  Surgeon: Isaias Cowman, MD;  Location: [facility];  Service: Cardiovascular;  Laterality: N/A;   ROTATOR CUFF REPAIR     bilateral    Family History  Problem Relation Age of Onset   Kidney disease Brother    Heart disease Father    Heart disease Mother    Stroke Mother    Cancer Sister    Breast cancer Sister 67   Breast cancer Paternal Aunt    Anesthesia problems Neg Hx    Hypotension Neg Hx    Malignant  hyperthermia Neg Hx    Pseudochol deficiency Neg Hx     Allergies  Allergen Reactions   Flexeril [Cyclobenzaprine Hcl]  Severe Rash   Vancomycin Rash    Red [place] Syndrome    CBC Latest Ref Rng & Units 09/19/2021 09/18/2021 09/18/2021  WBC 4.0 - 10.5 K/uL 5.5 5.3 5.7  Hemoglobin 12.0 - 15.0 g/dL 10.7(L) 11.3(L) 11.2(L)  Hematocrit 36.0 - 46.0 % 34.3(L) 37.6 37.2  Platelets 150 - 400 K/uL 198 204 209      CMP     Component Value Date/Time   NA 137 09/22/2021 0635   NA 138 09/06/2014 2215   K 3.6 09/22/2021 0635   K 4.4 09/06/2014 2215   CL 98 09/22/2021 0635   CL 106 09/06/2014 2215   CO2 29 09/22/2021 0635   CO2 23 09/06/2014 2215   GLUCOSE 83 09/22/2021 0635   GLUCOSE 92 09/06/2014 2215   BUN 39 (H) 09/22/2021 0635   BUN 25 (H) 09/06/2014 2215   CREATININE 1.26 (H) 09/22/2021 0635   CREATININE 1.62 (H) 09/17/2021 0951   CALCIUM 8.9 09/22/2021 0635   CALCIUM 9.0 09/06/2014 2215   PROT 7.0 11/07/2020 2002   PROT 7.1 07/10/2014 1219   ALBUMIN 3.7 11/07/2020 2002   ALBUMIN 3.2 (L) 07/10/2014 1219   AST 21 11/07/2020 2002   AST 26 07/10/2014 1219   ALT 16 11/07/2020 2002   ALT 15 07/10/2014 1219   ALKPHOS 55 11/07/2020 2002   ALKPHOS 69 07/10/2014 1219   BILITOT 0.6 11/07/2020 2002   BILITOT 0.4 07/10/2014 1219   GFRNONAA 41 (L) 09/22/2021 0635   GFRNONAA 45 (L) 10/02/2020 1106   GFRAA 52 (L) 10/02/2020 1106     VAS [name] ABI WITH/WO TBI  Result Date: 09/24/2021  LOWER EXTREMITY DOPPLER STUDY Patient Name:  Kristin [name]  Date of Exam:   09/24/2021 Medical Rec #: [id number]      Accession #:    [id number] Date of Birth: November 25, 1929       Patient Gender: F Patient Age:   69 years Exam Location:  [facility] Procedure:      VAS [name] ABI WITH/WO TBI Referring Phys: Hortencia Pilar --------------------------------------------------------------------------------  Indications: Peripheral artery disease.  Vascular Interventions: 10/14/2020: PTA of the Left SFA and  Popliteal Artery.                         PTA of the Left Posterior tibial Artery and                         Tibioperoneal trunk. PTA and Stent placement Left EIA. Comparison Study: 08/19/2021 Performing Technologist: Almira Coaster RVS  Examination Guidelines: A complete evaluation includes at minimum, Doppler waveform signals and systolic blood pressure reading at the level of bilateral brachial, anterior tibial, and posterior tibial arteries, when vessel segments are accessible. Bilateral testing is considered an integral part of a complete examination. Photoelectric Plethysmograph (PPG) waveforms and toe systolic pressure readings are included as required and additional duplex testing as needed. Limited examinations for reoccurring indications may be performed as noted.  ABI Findings: +---------+------------------+-----+----------+--------+  Right     Rt Pressure (mmHg) Index Waveform   Comment   +---------+------------------+-----+----------+--------+  ATA       168                1.39  monophasic           +---------+------------------+-----+----------+--------+  PTA       0  0.00  absent               +---------+------------------+-----+----------+--------+  Great Toe 139                1.15  Normal               +---------+------------------+-----+----------+--------+ +---------+------------------+-----+--------+-------+  Left      Lt Pressure (mmHg) Index Waveform Comment  +---------+------------------+-----+--------+-------+  Brachial  121                                        +---------+------------------+-----+--------+-------+  ATA       120                0.99  biphasic          +---------+------------------+-----+--------+-------+  PTA       142                1.17  biphasic          +---------+------------------+-----+--------+-------+  Great Toe 116                0.96  Normal            +---------+------------------+-----+--------+-------+  +-------+-----------+-----------+------------+------------+  ABI/TBI Today's ABI Today's TBI Previous ABI Previous TBI  +-------+-----------+-----------+------------+------------+  Right   1.39        1.15        .84          .55           +-------+-----------+-----------+------------+------------+  Left    1.17        .96         1.11         .81           +-------+-----------+-----------+------------+------------+  Bilateral TBIs appear increased compared to prior study on 08/19/2021. Left ABIs appear essentially unchanged compared to prior study on 08/19/2021. Rt ABIs appear increased compared to prior study on 08/19/2021.  Summary: Right: Resting right ankle-brachial index is within normal range. No evidence of significant right lower extremity arterial disease. The right toe-brachial index is normal. Left: Resting left ankle-brachial index is within normal range. No evidence of significant left lower extremity arterial disease. The left toe-brachial index is normal.  *See table(s) above for measurements and observations.  Electronically signed by Hortencia Pilar MD on 09/24/2021 at 3:09:19 PM.    Final        Assessment & Plan:   1. Atherosclerosis of native arteries of the extremities with ulceration ([place]) Currently the patient's biggest concerns with the pain in her lower extremities.  I suspect the pain may be worsened due to her decreasing pain medications.  We will try topical Voltaren gel to see if this helps with her relief.  Otherwise we will have the patient follow-up in 3 months with noninvasive studies.  2. Lymphedema Lymphedema can worsen feelings of arthritis and/or neuropathy.  Patient is advised to continue to use medical grade compression stockings.  The patient notes that her leg was previously wrapped by home health, she can continue with the wraps that they have been getting done by home health as this will also help control her edema.   Current Outpatient Medications on File Prior  to Visit  Medication Sig Dispense Refill   acetaminophen (TYLENOL) 500 MG tablet Take 500 mg by mouth in the morning and at bedtime.  apixaban (ELIQUIS) 2.5 MG TABS tablet Take 1 tablet (2.5 mg total) by mouth 2 (two) times daily. 60 tablet 0   atorvastatin (LIPITOR) 20 MG tablet TAKE 1 TABLET BY MOUTH ONCE DAILY FOR CHOLESTEROL 30 tablet 11   bisacodyl (DULCOLAX) 5 MG EC tablet Take 2 tablets (10 mg total) by mouth daily as needed for moderate constipation. 180 tablet 1   BREZTRI AEROSPHERE 160-9-4.8 MCG/ACT AERO Inhale 2 puffs into the lungs 2 (two) times daily. 10.7 g 5   Cholecalciferol (VITAMIN D) 2000 units tablet Take 2,000 Units by mouth daily.      furosemide (LASIX) 40 MG tablet Take 1 tablet (40 mg total) by mouth daily. 30 tablet 1   gabapentin (NEURONTIN) 100 MG capsule TAKE 3 CAPSULES BY MOUTH AT BEDTIME 270 capsule 0   GNP ASPIRIN LOW DOSE 81 MG EC tablet TAKE 1 TABLET BY MOUTH ONCE DAILY. *SWALLOW WHOLE* 150 tablet 11   levocetirizine (XYZAL) 5 MG tablet TAKE (1/2) TABLET BY MOUTH EVERY OTHER DAY. 8 tablet 9   Melatonin 10 MG TABS Take 10 mg by mouth at bedtime.     oxyCODONE (OXY IR/ROXICODONE) 5 MG immediate release tablet Take 2 tablets (10 mg total) by mouth 2 (two) times daily. May also take 1 tablet (5 mg total) at bedtime as needed for severe pain. Must last 30 days.. 150 tablet 0   OXYGEN Inhale 2 L into the lungs daily.     No current facility-administered medications on file prior to visit.    There are no Patient Instructions on file for this visit. No follow-ups on file.   Kris Hartmann, NP

## 2021-10-27 ENCOUNTER — Other Ambulatory Visit: Payer: Self-pay | Admitting: Family Medicine

## 2021-10-27 DIAGNOSIS — G2581 Restless legs syndrome: Secondary | ICD-10-CM

## 2021-10-28 NOTE — Telephone Encounter (Signed)
Refilled and P/u . ?Requested Prescriptions  ?Pending Prescriptions Disp Refills  ?? rOPINIRole (REQUIP) 2 MG tablet [Pharmacy Med Name: ROPINIROLE HCL 2 MG TABLET] 180 tablet 0  ?  Sig: TAKE 2 TABLETS BY MOUTH AT BEDTIME  ?  ? Neurology:  Parkinsonian Agents Passed - 10/28/2021  8:14 AM  ?  ?  Passed - Last BP in normal range  ?  BP Readings from Last 1 Encounters:  ?10/15/21 107/68  ?   ?  ?  Passed - Last Heart Rate in normal range  ?  Pulse Readings from Last 1 Encounters:  ?10/15/21 90  ?   ?  ?  Passed - Valid encounter within last 12 months  ?  Recent Outpatient Visits   ?      ? 4 weeks ago Chronic diastolic CHF (congestive heart failure) ([place])  ? [place], DO  ? 1 month ago Shortness of breath  ? [place], DO  ? 2 months ago Venous insufficiency of both lower extremities  ? [place], DO  ? 3 months ago Centrilobular emphysema [facility])  ? [place], DO  ? 6 months ago Chronic diastolic CHF (congestive heart failure) ([place])  ? [place], DO  ?  ?  ?Future Appointments   ?        ? In 1 week [place] Devonne Doughty, DO [facility] [place]  ?  ? ?  ?  ?  ? ?

## 2021-10-28 NOTE — Telephone Encounter (Signed)
Called and s/w granddaughter.  This medication has already been picked up from the pharmacy. ?

## 2021-10-29 DIAGNOSIS — L97321 Non-pressure chronic ulcer of left ankle limited to breakdown of skin: Secondary | ICD-10-CM | POA: Diagnosis not present

## 2021-10-29 DIAGNOSIS — M1030 Gout due to renal impairment, unspecified site: Secondary | ICD-10-CM | POA: Diagnosis not present

## 2021-10-29 DIAGNOSIS — N189 Chronic kidney disease, unspecified: Secondary | ICD-10-CM | POA: Diagnosis not present

## 2021-10-29 DIAGNOSIS — I5033 Acute on chronic diastolic (congestive) heart failure: Secondary | ICD-10-CM | POA: Diagnosis not present

## 2021-10-29 DIAGNOSIS — I130 Hypertensive heart and chronic kidney disease with heart failure and stage 1 through stage 4 chronic kidney disease, or unspecified chronic kidney disease: Secondary | ICD-10-CM | POA: Diagnosis not present

## 2021-10-29 DIAGNOSIS — I890 Lymphedema, not elsewhere classified: Secondary | ICD-10-CM | POA: Diagnosis not present

## 2021-10-29 DIAGNOSIS — J449 Chronic obstructive pulmonary disease, unspecified: Secondary | ICD-10-CM | POA: Diagnosis not present

## 2021-10-29 DIAGNOSIS — I872 Venous insufficiency (chronic) (peripheral): Secondary | ICD-10-CM | POA: Diagnosis not present

## 2021-10-29 DIAGNOSIS — I480 Paroxysmal atrial fibrillation: Secondary | ICD-10-CM | POA: Diagnosis not present

## 2021-10-31 DIAGNOSIS — Z0100 Encounter for examination of eyes and vision without abnormal findings: Secondary | ICD-10-CM | POA: Diagnosis not present

## 2021-11-04 DIAGNOSIS — I130 Hypertensive heart and chronic kidney disease with heart failure and stage 1 through stage 4 chronic kidney disease, or unspecified chronic kidney disease: Secondary | ICD-10-CM | POA: Diagnosis not present

## 2021-11-04 DIAGNOSIS — I890 Lymphedema, not elsewhere classified: Secondary | ICD-10-CM | POA: Diagnosis not present

## 2021-11-04 DIAGNOSIS — I872 Venous insufficiency (chronic) (peripheral): Secondary | ICD-10-CM | POA: Diagnosis not present

## 2021-11-04 DIAGNOSIS — I5033 Acute on chronic diastolic (congestive) heart failure: Secondary | ICD-10-CM | POA: Diagnosis not present

## 2021-11-04 DIAGNOSIS — I480 Paroxysmal atrial fibrillation: Secondary | ICD-10-CM | POA: Diagnosis not present

## 2021-11-04 DIAGNOSIS — L97321 Non-pressure chronic ulcer of left ankle limited to breakdown of skin: Secondary | ICD-10-CM | POA: Diagnosis not present

## 2021-11-04 DIAGNOSIS — M1030 Gout due to renal impairment, unspecified site: Secondary | ICD-10-CM | POA: Diagnosis not present

## 2021-11-04 DIAGNOSIS — N189 Chronic kidney disease, unspecified: Secondary | ICD-10-CM | POA: Diagnosis not present

## 2021-11-04 DIAGNOSIS — J449 Chronic obstructive pulmonary disease, unspecified: Secondary | ICD-10-CM | POA: Diagnosis not present

## 2021-11-05 ENCOUNTER — Inpatient Hospital Stay
Admission: EM | Admit: 2021-11-05 | Discharge: 2021-11-12 | DRG: 853 | Disposition: A | Discharge status: Home Health | Payer: Medicare HMO | Attending: Internal Medicine | Admitting: Internal Medicine

## 2021-11-05 ENCOUNTER — Emergency Department: Payer: Medicare HMO

## 2021-11-05 ENCOUNTER — Ambulatory Visit (INDEPENDENT_AMBULATORY_CARE_PROVIDER_SITE_OTHER): Payer: Medicare HMO | Admitting: Family Medicine

## 2021-11-05 ENCOUNTER — Other Ambulatory Visit: Payer: Self-pay

## 2021-11-05 ENCOUNTER — Encounter: Payer: Self-pay | Admitting: Family Medicine

## 2021-11-05 DIAGNOSIS — Z8616 Personal history of COVID-19: Secondary | ICD-10-CM | POA: Diagnosis not present

## 2021-11-05 DIAGNOSIS — N1831 Chronic kidney disease, stage 3a: Secondary | ICD-10-CM | POA: Diagnosis present

## 2021-11-05 DIAGNOSIS — J479 Bronchiectasis, uncomplicated: Secondary | ICD-10-CM | POA: Diagnosis not present

## 2021-11-05 DIAGNOSIS — C3411 Malignant neoplasm of upper lobe, right bronchus or lung: Secondary | ICD-10-CM | POA: Diagnosis not present

## 2021-11-05 DIAGNOSIS — I252 Old myocardial infarction: Secondary | ICD-10-CM | POA: Diagnosis not present

## 2021-11-05 DIAGNOSIS — Z881 Allergy status to other antibiotic agents status: Secondary | ICD-10-CM

## 2021-11-05 DIAGNOSIS — J470 Bronchiectasis with acute lower respiratory infection: Secondary | ICD-10-CM | POA: Diagnosis not present

## 2021-11-05 DIAGNOSIS — Z853 Personal history of malignant neoplasm of breast: Secondary | ICD-10-CM | POA: Diagnosis not present

## 2021-11-05 DIAGNOSIS — Z9221 Personal history of antineoplastic chemotherapy: Secondary | ICD-10-CM | POA: Diagnosis not present

## 2021-11-05 DIAGNOSIS — Z841 Family history of disorders of kidney and ureter: Secondary | ICD-10-CM

## 2021-11-05 DIAGNOSIS — J188 Other pneumonia, unspecified organism: Secondary | ICD-10-CM | POA: Diagnosis not present

## 2021-11-05 DIAGNOSIS — J8410 Pulmonary fibrosis, unspecified: Secondary | ICD-10-CM | POA: Diagnosis present

## 2021-11-05 DIAGNOSIS — R6520 Severe sepsis without septic shock: Secondary | ICD-10-CM | POA: Diagnosis not present

## 2021-11-05 DIAGNOSIS — K5730 Diverticulosis of large intestine without perforation or abscess without bleeding: Secondary | ICD-10-CM | POA: Diagnosis not present

## 2021-11-05 DIAGNOSIS — R911 Solitary pulmonary nodule: Secondary | ICD-10-CM | POA: Diagnosis not present

## 2021-11-05 DIAGNOSIS — B1920 Unspecified viral hepatitis C without hepatic coma: Secondary | ICD-10-CM | POA: Diagnosis present

## 2021-11-05 DIAGNOSIS — G2581 Restless legs syndrome: Secondary | ICD-10-CM | POA: Diagnosis present

## 2021-11-05 DIAGNOSIS — J432 Centrilobular emphysema: Secondary | ICD-10-CM | POA: Diagnosis not present

## 2021-11-05 DIAGNOSIS — Z7982 Long term (current) use of aspirin: Secondary | ICD-10-CM

## 2021-11-05 DIAGNOSIS — A419 Sepsis, unspecified organism: Secondary | ICD-10-CM | POA: Diagnosis not present

## 2021-11-05 DIAGNOSIS — J189 Pneumonia, unspecified organism: Secondary | ICD-10-CM | POA: Diagnosis not present

## 2021-11-05 DIAGNOSIS — I5033 Acute on chronic diastolic (congestive) heart failure: Secondary | ICD-10-CM | POA: Diagnosis present

## 2021-11-05 DIAGNOSIS — I4892 Unspecified atrial flutter: Secondary | ICD-10-CM | POA: Diagnosis present

## 2021-11-05 DIAGNOSIS — Z803 Family history of malignant neoplasm of breast: Secondary | ICD-10-CM

## 2021-11-05 DIAGNOSIS — Z79899 Other long term (current) drug therapy: Secondary | ICD-10-CM

## 2021-11-05 DIAGNOSIS — Z9981 Dependence on supplemental oxygen: Secondary | ICD-10-CM

## 2021-11-05 DIAGNOSIS — Z87891 Personal history of nicotine dependence: Secondary | ICD-10-CM | POA: Diagnosis not present

## 2021-11-05 DIAGNOSIS — Z1612 Extended spectrum beta lactamase (ESBL) resistance: Secondary | ICD-10-CM | POA: Diagnosis not present

## 2021-11-05 DIAGNOSIS — R509 Fever, unspecified: Secondary | ICD-10-CM | POA: Diagnosis not present

## 2021-11-05 DIAGNOSIS — E663 Overweight: Secondary | ICD-10-CM | POA: Diagnosis not present

## 2021-11-05 DIAGNOSIS — J441 Chronic obstructive pulmonary disease with (acute) exacerbation: Secondary | ICD-10-CM | POA: Diagnosis not present

## 2021-11-05 DIAGNOSIS — Z515 Encounter for palliative care: Secondary | ICD-10-CM | POA: Diagnosis not present

## 2021-11-05 DIAGNOSIS — I130 Hypertensive heart and chronic kidney disease with heart failure and stage 1 through stage 4 chronic kidney disease, or unspecified chronic kidney disease: Secondary | ICD-10-CM | POA: Diagnosis present

## 2021-11-05 DIAGNOSIS — Z87440 Personal history of urinary (tract) infections: Secondary | ICD-10-CM

## 2021-11-05 DIAGNOSIS — R069 Unspecified abnormalities of breathing: Secondary | ICD-10-CM | POA: Diagnosis not present

## 2021-11-05 DIAGNOSIS — Z7901 Long term (current) use of anticoagulants: Secondary | ICD-10-CM

## 2021-11-05 DIAGNOSIS — Z20822 Contact with and (suspected) exposure to covid-19: Secondary | ICD-10-CM | POA: Diagnosis not present

## 2021-11-05 DIAGNOSIS — G8929 Other chronic pain: Secondary | ICD-10-CM | POA: Diagnosis present

## 2021-11-05 DIAGNOSIS — G629 Polyneuropathy, unspecified: Secondary | ICD-10-CM | POA: Diagnosis present

## 2021-11-05 DIAGNOSIS — R0902 Hypoxemia: Secondary | ICD-10-CM

## 2021-11-05 DIAGNOSIS — N3281 Overactive bladder: Secondary | ICD-10-CM | POA: Diagnosis present

## 2021-11-05 DIAGNOSIS — Z923 Personal history of irradiation: Secondary | ICD-10-CM | POA: Diagnosis not present

## 2021-11-05 DIAGNOSIS — D649 Anemia, unspecified: Secondary | ICD-10-CM | POA: Diagnosis present

## 2021-11-05 DIAGNOSIS — J9621 Acute and chronic respiratory failure with hypoxia: Secondary | ICD-10-CM | POA: Diagnosis present

## 2021-11-05 DIAGNOSIS — R918 Other nonspecific abnormal finding of lung field: Secondary | ICD-10-CM | POA: Diagnosis present

## 2021-11-05 DIAGNOSIS — E785 Hyperlipidemia, unspecified: Secondary | ICD-10-CM | POA: Diagnosis present

## 2021-11-05 DIAGNOSIS — D696 Thrombocytopenia, unspecified: Secondary | ICD-10-CM | POA: Diagnosis not present

## 2021-11-05 DIAGNOSIS — J45998 Other asthma: Secondary | ICD-10-CM | POA: Diagnosis not present

## 2021-11-05 DIAGNOSIS — J9601 Acute respiratory failure with hypoxia: Secondary | ICD-10-CM | POA: Diagnosis not present

## 2021-11-05 DIAGNOSIS — A4151 Sepsis due to Escherichia coli [E. coli]: Secondary | ICD-10-CM | POA: Diagnosis not present

## 2021-11-05 DIAGNOSIS — I5032 Chronic diastolic (congestive) heart failure: Secondary | ICD-10-CM | POA: Diagnosis not present

## 2021-11-05 DIAGNOSIS — Z950 Presence of cardiac pacemaker: Secondary | ICD-10-CM

## 2021-11-05 DIAGNOSIS — R7881 Bacteremia: Secondary | ICD-10-CM | POA: Diagnosis present

## 2021-11-05 DIAGNOSIS — A499 Bacterial infection, unspecified: Secondary | ICD-10-CM | POA: Diagnosis not present

## 2021-11-05 DIAGNOSIS — R0602 Shortness of breath: Secondary | ICD-10-CM | POA: Diagnosis not present

## 2021-11-05 DIAGNOSIS — Z90710 Acquired absence of both cervix and uterus: Secondary | ICD-10-CM

## 2021-11-05 DIAGNOSIS — I480 Paroxysmal atrial fibrillation: Secondary | ICD-10-CM | POA: Diagnosis present

## 2021-11-05 DIAGNOSIS — I517 Cardiomegaly: Secondary | ICD-10-CM | POA: Diagnosis not present

## 2021-11-05 DIAGNOSIS — K8689 Other specified diseases of pancreas: Secondary | ICD-10-CM | POA: Diagnosis present

## 2021-11-05 DIAGNOSIS — J90 Pleural effusion, not elsewhere classified: Secondary | ICD-10-CM | POA: Diagnosis not present

## 2021-11-05 DIAGNOSIS — Z789 Other specified health status: Secondary | ICD-10-CM | POA: Diagnosis not present

## 2021-11-05 DIAGNOSIS — Z8249 Family history of ischemic heart disease and other diseases of the circulatory system: Secondary | ICD-10-CM

## 2021-11-05 DIAGNOSIS — I700 Atherosclerosis of aorta: Secondary | ICD-10-CM | POA: Diagnosis not present

## 2021-11-05 DIAGNOSIS — Z823 Family history of stroke: Secondary | ICD-10-CM

## 2021-11-05 DIAGNOSIS — I2510 Atherosclerotic heart disease of native coronary artery without angina pectoris: Secondary | ICD-10-CM | POA: Diagnosis not present

## 2021-11-05 DIAGNOSIS — C3490 Malignant neoplasm of unspecified part of unspecified bronchus or lung: Secondary | ICD-10-CM | POA: Diagnosis not present

## 2021-11-05 DIAGNOSIS — Z96653 Presence of artificial knee joint, bilateral: Secondary | ICD-10-CM | POA: Diagnosis present

## 2021-11-05 DIAGNOSIS — N281 Cyst of kidney, acquired: Secondary | ICD-10-CM | POA: Diagnosis not present

## 2021-11-05 DIAGNOSIS — Z888 Allergy status to other drugs, medicaments and biological substances status: Secondary | ICD-10-CM

## 2021-11-05 DIAGNOSIS — Z9049 Acquired absence of other specified parts of digestive tract: Secondary | ICD-10-CM

## 2021-11-05 DIAGNOSIS — I959 Hypotension, unspecified: Secondary | ICD-10-CM | POA: Diagnosis not present

## 2021-11-05 DIAGNOSIS — R062 Wheezing: Secondary | ICD-10-CM | POA: Diagnosis not present

## 2021-11-05 LAB — CBC WITH DIFFERENTIAL/PLATELET
Abs Immature Granulocytes: 0.03 10*3/uL (ref 0.00–0.07)
Basophils Absolute: 0 10*3/uL (ref 0.0–0.1)
Basophils Relative: 1 %
Eosinophils Absolute: 0.1 10*3/uL (ref 0.0–0.5)
Eosinophils Relative: 2 %
HCT: 38.4 % (ref 36.0–46.0)
Hemoglobin: 11.7 g/dL — ABNORMAL LOW (ref 12.0–15.0)
Immature Granulocytes: 0 %
Lymphocytes Relative: 11 %
Lymphs Abs: 0.8 10*3/uL (ref 0.7–4.0)
MCH: 27.3 pg (ref 26.0–34.0)
MCHC: 30.5 g/dL (ref 30.0–36.0)
MCV: 89.5 fL (ref 80.0–100.0)
Monocytes Absolute: 0.4 10*3/uL (ref 0.1–1.0)
Monocytes Relative: 6 %
Neutro Abs: 5.7 10*3/uL (ref 1.7–7.7)
Neutrophils Relative %: 80 %
Platelets: 144 10*3/uL — ABNORMAL LOW (ref 150–400)
RBC: 4.29 MIL/uL (ref 3.87–5.11)
RDW: 15.6 % — ABNORMAL HIGH (ref 11.5–15.5)
WBC: 7.1 10*3/uL (ref 4.0–10.5)
nRBC: 0 % (ref 0.0–0.2)

## 2021-11-05 LAB — LIPID PANEL
Cholesterol: 109 mg/dL (ref 0–200)
HDL: 51 mg/dL (ref >40)
LDL Cholesterol: 49 mg/dL (ref 0–99)
Total CHOL/HDL Ratio: 2.1 RATIO
Triglycerides: 46 mg/dL (ref <150)
VLDL: 9 mg/dL (ref 0–40)

## 2021-11-05 LAB — HEPATIC FUNCTION PANEL
ALT: 63 U/L — ABNORMAL HIGH (ref 0–44)
AST: 74 U/L — ABNORMAL HIGH (ref 15–41)
Albumin: 3.8 g/dL (ref 3.5–5.0)
Alkaline Phosphatase: 106 U/L (ref 38–126)
Bilirubin, Direct: 0.3 mg/dL — ABNORMAL HIGH (ref 0.0–0.2)
Indirect Bilirubin: 0.8 mg/dL (ref 0.3–0.9)
Total Bilirubin: 1.1 mg/dL (ref 0.3–1.2)
Total Protein: 7 g/dL (ref 6.5–8.1)

## 2021-11-05 LAB — URINALYSIS, COMPLETE (UACMP) WITH MICROSCOPIC
Bacteria, UA: NONE SEEN
Bilirubin Urine: NEGATIVE
Glucose, UA: NEGATIVE mg/dL
Ketones, ur: NEGATIVE mg/dL
Leukocytes,Ua: NEGATIVE
Nitrite: NEGATIVE
Protein, ur: NEGATIVE mg/dL
Specific Gravity, Urine: 1.017 (ref 1.005–1.030)
pH: 5 (ref 5.0–8.0)

## 2021-11-05 LAB — BASIC METABOLIC PANEL
Anion gap: 9 (ref 5–15)
BUN: 33 mg/dL — ABNORMAL HIGH (ref 8–23)
CO2: 25 mmol/L (ref 22–32)
Calcium: 9.1 mg/dL (ref 8.9–10.3)
Chloride: 102 mmol/L (ref 98–111)
Creatinine, Ser: 1.67 mg/dL — ABNORMAL HIGH (ref 0.44–1.00)
GFR, Estimated: 29 mL/min — ABNORMAL LOW (ref >60)
Glucose, Bld: 94 mg/dL (ref 70–99)
Potassium: 4.6 mmol/L (ref 3.5–5.1)
Sodium: 136 mmol/L (ref 135–145)

## 2021-11-05 LAB — RESP PANEL BY RT-PCR (FLU A&B, COVID) ARPGX2
Influenza A by PCR: NEGATIVE
Influenza B by PCR: NEGATIVE
SARS Coronavirus 2 by RT PCR: NEGATIVE

## 2021-11-05 LAB — LIPASE, BLOOD: Lipase: 30 U/L (ref 11–51)

## 2021-11-05 LAB — LACTIC ACID, PLASMA
Lactic Acid, Venous: 1.5 mmol/L (ref 0.5–1.9)
Lactic Acid, Venous: 2.1 mmol/L (ref 0.5–1.9)
Lactic Acid, Venous: 1.7 mmol/L (ref 0.5–1.9)

## 2021-11-05 LAB — BRAIN NATRIURETIC PEPTIDE: B Natriuretic Peptide: 1727.9 pg/mL — ABNORMAL HIGH (ref 0.0–100.0)

## 2021-11-05 LAB — STREP PNEUMONIAE URINARY ANTIGEN: Strep Pneumo Urinary Antigen: NEGATIVE

## 2021-11-05 LAB — PROTIME-INR
INR: 1.3 — ABNORMAL HIGH (ref 0.8–1.2)
Prothrombin Time: 16.5 seconds — ABNORMAL HIGH (ref 11.4–15.2)

## 2021-11-05 LAB — APTT: aPTT: 37 seconds — ABNORMAL HIGH (ref 24–36)

## 2021-11-05 MED ORDER — MELATONIN 5 MG PO TABS
10.0000 mg | ORAL_TABLET | Freq: Every day | ORAL | Status: DC
  Administered 2021-11-05 – 2021-11-11 (×7): 10 mg via ORAL
  Filled 2021-11-05 (×7): qty 2

## 2021-11-05 MED ORDER — ACETAMINOPHEN 325 MG PO TABS
650.0000 mg | ORAL_TABLET | Freq: Four times a day (QID) | ORAL | Status: DC | PRN
Start: 2021-11-05 — End: 2021-11-12
  Administered 2021-11-06 – 2021-11-09 (×4): 650 mg via ORAL
  Filled 2021-11-05 (×4): qty 2

## 2021-11-05 MED ORDER — CETIRIZINE HCL 10 MG PO TABS
5.0000 mg | ORAL_TABLET | Freq: Every day | ORAL | Status: DC | PRN
  Filled 2021-11-05: qty 1

## 2021-11-05 MED ORDER — GUAIFENESIN ER 600 MG PO TB12
600.0000 mg | ORAL_TABLET | Freq: Two times a day (BID) | ORAL | Status: DC
  Administered 2021-11-05 – 2021-11-12 (×13): 600 mg via ORAL
  Filled 2021-11-05 (×13): qty 1

## 2021-11-05 MED ORDER — PREDNISONE 20 MG PO TABS
40.0000 mg | ORAL_TABLET | Freq: Every day | ORAL | Status: DC
  Administered 2021-11-07 – 2021-11-08 (×2): 40 mg via ORAL
  Filled 2021-11-05 (×2): qty 2

## 2021-11-05 MED ORDER — ROPINIROLE HCL 1 MG PO TABS
4.0000 mg | ORAL_TABLET | Freq: Every day | ORAL | Status: DC
  Administered 2021-11-05 – 2021-11-11 (×7): 4 mg via ORAL
  Filled 2021-11-05 (×8): qty 4

## 2021-11-05 MED ORDER — DICLOFENAC SODIUM 1 % EX GEL
4.0000 g | Freq: Two times a day (BID) | CUTANEOUS | Status: DC | PRN
  Administered 2021-11-06 – 2021-11-08 (×4): 4 g via TOPICAL
  Filled 2021-11-05 (×2): qty 100

## 2021-11-05 MED ORDER — ASPIRIN 81 MG PO CHEW
324.0000 mg | CHEWABLE_TABLET | Freq: Once | ORAL | Status: DC
  Filled 2021-11-05: qty 4

## 2021-11-05 MED ORDER — MAGNESIUM HYDROXIDE 400 MG/5ML PO SUSP: 30.0000 mL | Freq: Every day | ORAL | Status: DC | PRN

## 2021-11-05 MED ORDER — ENOXAPARIN SODIUM 40 MG/0.4ML IJ SOSY: 40.0000 mg | PREFILLED_SYRINGE | INTRAMUSCULAR | Status: DC

## 2021-11-05 MED ORDER — IPRATROPIUM-ALBUTEROL 0.5-2.5 (3) MG/3ML IN SOLN
3.0000 mL | Freq: Four times a day (QID) | RESPIRATORY_TRACT | Status: DC
Start: 2021-11-05 — End: 2021-11-06
  Administered 2021-11-05: 3 mL via RESPIRATORY_TRACT
  Filled 2021-11-05: qty 3

## 2021-11-05 MED ORDER — OXYCODONE HCL 5 MG PO TABS
5.0000 mg | ORAL_TABLET | ORAL | Status: DC | PRN
  Administered 2021-11-05 – 2021-11-06 (×2): 5 mg via ORAL
  Filled 2021-11-05 (×2): qty 1

## 2021-11-05 MED ORDER — GABAPENTIN 300 MG PO CAPS
300.0000 mg | ORAL_CAPSULE | Freq: Every day | ORAL | Status: DC
  Administered 2021-11-05 – 2021-11-06 (×2): 300 mg via ORAL
  Filled 2021-11-05 (×2): qty 1

## 2021-11-05 MED ORDER — APIXABAN 2.5 MG PO TABS
2.5000 mg | ORAL_TABLET | Freq: Two times a day (BID) | ORAL | Status: DC
  Administered 2021-11-05 – 2021-11-07 (×4): 2.5 mg via ORAL
  Filled 2021-11-05 (×5): qty 1

## 2021-11-05 MED ORDER — SODIUM CHLORIDE 0.9 % IV SOLN
100.0000 mg | Freq: Two times a day (BID) | INTRAVENOUS | Status: DC
  Administered 2021-11-05: 100 mg via INTRAVENOUS
  Filled 2021-11-05 (×2): qty 100

## 2021-11-05 MED ORDER — BISACODYL 5 MG PO TBEC
10.0000 mg | DELAYED_RELEASE_TABLET | Freq: Every day | ORAL | Status: DC | PRN
  Administered 2021-11-09 – 2021-11-11 (×2): 10 mg via ORAL
  Filled 2021-11-05 (×3): qty 2

## 2021-11-05 MED ORDER — METHYLPREDNISOLONE SODIUM SUCC 125 MG IJ SOLR
125.0000 mg | Freq: Once | INTRAMUSCULAR | Status: AC
  Administered 2021-11-05: 125 mg via INTRAVENOUS
  Filled 2021-11-05: qty 2

## 2021-11-05 MED ORDER — HEPARIN SODIUM (PORCINE) 5000 UNIT/ML IJ SOLN
4000.0000 [IU] | Freq: Once | INTRAMUSCULAR | Status: DC
  Filled 2021-11-05: qty 1

## 2021-11-05 MED ORDER — METOCLOPRAMIDE HCL 5 MG/ML IJ SOLN: 10.0000 mg | Freq: Four times a day (QID) | INTRAMUSCULAR | Status: DC | PRN

## 2021-11-05 MED ORDER — METHYLPREDNISOLONE SODIUM SUCC 125 MG IJ SOLR
80.0000 mg | INTRAMUSCULAR | Status: AC
  Administered 2021-11-06: 80 mg via INTRAVENOUS
  Filled 2021-11-05: qty 2

## 2021-11-05 MED ORDER — FUROSEMIDE 40 MG PO TABS
40.0000 mg | ORAL_TABLET | Freq: Every day | ORAL | Status: DC
  Administered 2021-11-05 – 2021-11-08 (×4): 40 mg via ORAL
  Filled 2021-11-05 (×4): qty 1

## 2021-11-05 MED ORDER — TRAZODONE HCL 50 MG PO TABS
25.0000 mg | ORAL_TABLET | Freq: Every evening | ORAL | Status: DC | PRN
  Filled 2021-11-05: qty 1

## 2021-11-05 MED ORDER — VITAMIN D 25 MCG (1000 UNIT) PO TABS
2000.0000 [IU] | ORAL_TABLET | Freq: Every day | ORAL | Status: DC
  Administered 2021-11-05 – 2021-11-12 (×7): 2000 [IU] via ORAL
  Filled 2021-11-05 (×7): qty 2

## 2021-11-05 MED ORDER — SODIUM CHLORIDE 0.9 % IV SOLN: INTRAVENOUS | Status: DC

## 2021-11-05 MED ORDER — ACETAMINOPHEN 650 MG RE SUPP: 650.0000 mg | Freq: Four times a day (QID) | RECTAL | Status: DC | PRN

## 2021-11-05 MED ORDER — SODIUM CHLORIDE 0.9 % IV SOLN: 2.0000 g | INTRAVENOUS | Status: DC

## 2021-11-05 MED ORDER — ATORVASTATIN CALCIUM 20 MG PO TABS
20.0000 mg | ORAL_TABLET | Freq: Every day | ORAL | Status: DC
  Administered 2021-11-05 – 2021-11-12 (×8): 20 mg via ORAL
  Filled 2021-11-05 (×8): qty 1

## 2021-11-05 MED ORDER — SODIUM CHLORIDE 0.9 % IV SOLN
2.0000 g | INTRAVENOUS | Status: DC
  Administered 2021-11-05: 2 g via INTRAVENOUS
  Filled 2021-11-05: qty 20

## 2021-11-05 MED ORDER — ACETAMINOPHEN 325 MG PO TABS
650.0000 mg | ORAL_TABLET | Freq: Once | ORAL | Status: AC
Start: 2021-11-05 — End: 2021-11-05
  Administered 2021-11-05: 650 mg via ORAL
  Filled 2021-11-05: qty 2

## 2021-11-05 MED ORDER — HYDROCOD POLI-CHLORPHE POLI ER 10-8 MG/5ML PO SUER
5.0000 mL | Freq: Two times a day (BID) | ORAL | Status: DC | PRN
  Administered 2021-11-06 – 2021-11-10 (×5): 5 mL via ORAL
  Filled 2021-11-05 (×5): qty 5

## 2021-11-05 NOTE — H&P (Signed)
?  ?  ?Kristin [name] ? ? ?PATIENT NAME: Kristin [name]   ? ?MR#:  [id number] ? ?DATE OF BIRTH:   23, 1931 ? ?DATE OF ADMISSION:  11/05/2021 ? ?PRIMARY CARE PHYSICIAN: Olin Hauser, DO  ? ?Patient is coming from: Home ? ?REQUESTING/REFERRING PHYSICIAN: Menshew, Dannielle Karvonen ? ?CHIEF COMPLAINT:  ? ?Chief Complaint  ?Patient presents with  ?? Shortness of Breath  ?? Fever  ? ? ?HISTORY OF PRESENT ILLNESS:  ?Kristin [name] is a 88 y.o. female with medical history significant for CHF, COPD, hepatitis C, dyslipidemia, coronary artery disease, hypertension, dyslipidemia and paroxysmal atrial fibrillation on Eliquis, who presented to the ER with acute onset of dyspnea and hypoxemia at her PCPs office with pulse ox of 82% on room air.  She was also noted to have a fever and admitted to chills.  She had nausea and vomiting a couple days ago after eating a sandwich.  She admits to cough productive of clear sputum as well as wheezing.  She has not smoked in 30 years.  She admits constipation without diarrhea or melena or bright red bleeding per rectum.  No chest pain or palpitations. ? ?ED Course: When she came to the ER, temperature was 104.4 BP was 99/52 approximately 95% on 2 L of O2 by nasal cannula with otherwise normal vital signs.  Labs revealed a BUN of 33 and creatinine 1.67 up from 1.26 on 09/22/2021.  BNP was 1727.9 and lactic acid was 1.5 and CBC showed hemoglobin 11.7 hematocrit 38.4 better than previous levels with platelets of 144 compared to 198 in uary.  INR was 1.3 and PT 16.5 with PTT of 37. ?EKG as reviewed by me : EKG showed normal paced rhythm with a rate of 83 with PACs. ?Imaging: Chest x-ray showed cardiomegaly and chronic lung disease with no acute cardiopulmonary process and aortic atherosclerosis. ?Abdominal and pelvic and chest CT without contrast revealed the following: ?1. Solid 4.5 cm posterior right upper lobe lung mass, new, ?suspicious for primary bronchogenic carcinoma.  Multidisciplinary ?thoracic oncology consultation suggested. ?2. Two new indistinct peribronchovascular pulmonary nodules in the ?superior segment right lower lobe, largest 1.0 cm, indeterminate for ?metastatic disease. ?3. No lymphadenopathy or other potential findings of metastatic ?disease in the chest, abdomen or pelvis. ?4. Mild-to-moderate cardiomegaly. Three-vessel coronary ?atherosclerosis. ?5. Dilated main pulmonary artery, suggesting pulmonary arterial ?hypertension. ?6. Hyperdense 1.0 cm focus in the lower third of the common bile ?duct adjacent to the region of the ampulla, cannot exclude ?choledocholithiasis. Recommend correlation with serum bilirubin ?levels. ?7. Generalized mild pancreatic duct dilation (3 mm diameter), ?minimally more prominent since 07/27/2010 MRI abdomen. ?8. Mild colonic diverticulosis. ?9. Nonspecific chronic pulmonary fibrosis at the lung bases, cannot ?exclude interstitial lung disease including UIP. ?10. Aortic Atherosclerosis (ICD10-I70.0) and Emphysema. ? ?The patient will be admitted to a medical telemetry bed for further evaluation and management.   ? ?PAST MEDICAL HISTORY:  ? ?Past Medical History:  ?Diagnosis Date  ?? Acute postoperative pain 02/09/2018  ?? Arrhythmia, sinus node 04/03/2014  ?? Arthritis   ?? Arthritis   ?? Atrial fibrillation ([place])   ?? Back pain   ? lower back chronic  ?? Bradycardia   ?? Breast cancer ([place]) 2004  ? right breast cancer  ?? Cancer [facility]) 2004  ? rt breast cancer-post lumpectomy- chemo/rad  ?? CHF (congestive heart failure) ([place])   ?? Chronic back pain   ?? CKD (chronic kidney disease)   ?? COPD (chronic obstructive pulmonary disease) ([place])   ?  wears O2 at 2L via [place] at night  ?? COVID-19 06/11/2020  ?? Cystocele   ?? Decubitus ulcers   ?? Dehydration   ?? Gout   ?? Hematuria   ?? Hepatitis C   ?? Hyperlipidemia   ?? Hypertension   ?? Hypomagnesemia 06/25/2015  ?? NSTEMI (non-ST elevated myocardial infarction) ([place]) 04/08/2021  ?? OAB  (overactive bladder)   ?? Overactive bladder   ?? Overdose opiate, accidental or unintentional, sequela 10/02/2020  ?? Restless leg   ?? Shoulder pain, bilateral   ?? Urinary frequency   ?? UTI (lower urinary tract infection)   ?? Vaginal atrophy   ? ? ?PAST SURGICAL HISTORY:  ? ?Past Surgical History:  ?Procedure Laterality Date  ?? ABDOMINAL HYSTERECTOMY    ?? BACK SURGERY    ?? BREAST LUMPECTOMY Right 2004  ?? BREAST SURGERY    ? rt lumpectomy  ?? CARPAL TUNNEL RELEASE    ?? CATARACT EXTRACTION W/PHACO  10/19/2011  ? Procedure: CATARACT EXTRACTION PHACO AND INTRAOCULAR LENS PLACEMENT (IOC);  Surgeon: Elta Guadeloupe T. Gershon Crane, MD;  Location: [facility];  Service: Ophthalmology;  Laterality: Right;  CDE:10.81  ?? CATARACT EXTRACTION W/PHACO  11/02/2011  ? Procedure: CATARACT EXTRACTION PHACO AND INTRAOCULAR LENS PLACEMENT (IOC);  Surgeon: Elta Guadeloupe T. Gershon Crane, MD;  Location: [facility];  Service: Ophthalmology;  Laterality: Left;  CDE 8.60  ?? CHOLECYSTECTOMY    ? 3/18  ?? COLON SURGERY    ?? INSERT / REPLACE / REMOVE PACEMAKER    ?? JOINT REPLACEMENT    ? bilateral TKA  ?? LOWER EXTREMITY ANGIOGRAPHY Left 10/14/2020  ? Procedure: LOWER EXTREMITY ANGIOGRAPHY;  Surgeon: Katha Cabal, MD;  Location: [place] [facility];  Service: Cardiovascular;  Laterality: Left;  ?? LOWER EXTREMITY ANGIOGRAPHY Right 09/08/2021  ? Procedure: LOWER EXTREMITY ANGIOGRAPHY;  Surgeon: Katha Cabal, MD;  Location: [place] [facility];  Service: Cardiovascular;  Laterality: Right;  ?? PACEMAKER INSERTION N/A 12/24/2015  ? Procedure: INSERTION PACEMAKER;  Surgeon: Isaias Cowman, MD;  Location: [facility];  Service: Cardiovascular;  Laterality: N/A;  ?? ROTATOR CUFF REPAIR    ? bilateral  ? ? ?SOCIAL HISTORY:  ? ?Social History  ? ?Tobacco Use  ?? Smoking status: Former  ?  Packs/day: 1.25  ?  Years: 40.00  ?  Pack years: 50.00  ?  Types: Cigarettes  ?  Quit date: 12/16/1998  ?  Years since quitting: 22.9  ?? Smokeless tobacco: Never  ?Substance Use  Topics  ?? Alcohol use: No  ?  Alcohol/week: 0.0 standard drinks  ? ? ?FAMILY HISTORY:  ? ?Family History  ?Problem Relation Age of Onset  ?? Kidney disease Brother   ?? Heart disease Father   ?? Heart disease Mother   ?? Stroke Mother   ?? Cancer Sister   ?? Breast cancer Sister 69  ?? Breast cancer Paternal Aunt   ?? Anesthesia problems Neg Hx   ?? Hypotension Neg Hx   ?? Malignant hyperthermia Neg Hx   ?? Pseudochol deficiency Neg Hx   ? ? ?DRUG ALLERGIES:  ? ?Allergies  ?Allergen Reactions  ?? Flexeril [Cyclobenzaprine Hcl]   ?  Severe Rash  ?? Vancomycin Rash  ?  [place] Syndrome  ? ? ?REVIEW OF SYSTEMS:  ? ?ROS ?As per history of present illness. All pertinent systems were reviewed above. Constitutional, HEENT, cardiovascular, respiratory, GI, GU, musculoskeletal, neuro, psychiatric, endocrine, integumentary and hematologic systems were reviewed and are otherwise negative/unremarkable except for positive findings mentioned above  in the HPI. ? ? ?MEDICATIONS AT HOME:  ? ?Prior to Admission medications   ?Medication Sig Start Date End Date Taking? Authorizing Provider  ?acetaminophen (TYLENOL) 500 MG tablet Take 500 mg by mouth in the morning and at bedtime.   Yes [provider]  ?albuterol (PROVENTIL) (2.5 MG/3ML) 0.083% nebulizer solution USE 1 VIAL IN NEBULIZER EVERY 8 HOURS AS NEEDED FOR WHEEZING OR SHORTNESS OF BREATH. 10/20/21  Yes Karamalegos, Devonne Doughty, DO  ?apixaban (ELIQUIS) 2.5 MG TABS tablet Take 1 tablet (2.5 mg total) by mouth 2 (two) times daily. 04/09/21  Yes Wieting, [name], MD  ?atorvastatin (LIPITOR) 20 MG tablet TAKE 1 TABLET BY MOUTH ONCE DAILY FOR CHOLESTEROL 02/17/21  Yes Karamalegos, Devonne Doughty, DO  ?bisacodyl (DULCOLAX) 5 MG EC tablet Take 2 tablets (10 mg total) by mouth daily as needed for moderate constipation. 07/04/20  Yes Olin Hauser, DO  ?Cholecalciferol (VITAMIN D) 2000 units tablet Take 2,000 Units by mouth daily.    Yes [provider]   ?diclofenac Sodium (VOLTAREN) 1 % GEL Apply 4 g topically 2 (two) times daily as needed (As needed for pain). 10/15/21  Yes Kris Hartmann, NP  ?furosemide (LASIX) 40 MG tablet Take 1 tablet (40 mg total) by mouth daily. 09/23/21  Yes

## 2021-11-05 NOTE — Assessment & Plan Note (Addendum)
Currently requiring 3 L oxygen to maintain oxygen saturation above 90% ?

## 2021-11-05 NOTE — ED Triage Notes (Signed)
From [facility], Pt has been having difficulty breathing since this morning, on 2L [place] at baseline, pt was satting 82% on room air at facility. Per medic pt wheezing bilaterally, pt took a neb tx today at home and got one from medics. Per medic temp 103.  ? ? ?

## 2021-11-05 NOTE — Assessment & Plan Note (Addendum)
-  Continue IV Rocephin and Zithromax. ?- Pulmonary consult ?

## 2021-11-05 NOTE — Progress Notes (Signed)
? ?Subjective:  ? ? [name], female    DOB: July 11, 1930, 88 y.o.   MRN: [id number] ? ?Kristin [name] is a 88 y.o. female presenting on 11/05/2021 for No chief complaint on file. ? ? ?HPI ? ?Acute COPD Exacerbation ?Today acute onset worsening dyspnea, lethargic, no significant inc swelling. Here today for COPD follow-up ?Has Breztri inhaler ?Used albuterol neb today prior to apt ?On O2 2L daily ? ?PMH  ?HFpEF CHF ?HTN CKD ?AFib A flutter ? ? ? ?Depression screen [facility] 2/9 06/09/2021 03/04/2021 01/01/2021  ?Decreased Interest 0 0 0  ?Down, Depressed, Hopeless 0 0 0  ?PHQ - 2 Score 0 0 0  ?Altered sleeping - - -  ?Tired, decreased energy - - -  ?Change in appetite - - -  ?Feeling bad or failure about yourself  - - -  ?Trouble concentrating - - -  ?Moving slowly or fidgety/restless - - -  ?Suicidal thoughts - - -  ?PHQ-9 Score - - -  ?Difficult doing work/chores - - -  ?Some recent data might be hidden  ? ? ?Social History  ? ?Tobacco Use  ? Smoking status: Former  ?  Packs/day: 1.25  ?  Years: 40.00  ?  Pack years: 50.00  ?  Types: Cigarettes  ?  Quit date: 12/16/1998  ?  Years since quitting: 22.9  ? Smokeless tobacco: Never  ?Vaping Use  ? Vaping Use: Never used  ?Substance Use Topics  ? Alcohol use: No  ?  Alcohol/week: 0.0 standard drinks  ? Drug use: Yes  ?  Types: Oxycodone  ? ? ?Review of Systems ?Per HPI unless specifically indicated above ? ?   ?Objective:  ?  ?SpO2 (!) 82%   ?Wt Readings from Last 3 Encounters:  ?10/15/21 157 lb (71.2 kg)  ?09/29/21 162 lb 3.2 oz (73.6 kg)  ?09/28/21 157 lb (71.2 kg)  ?  ?Physical Exam ?Vitals and nursing note reviewed.  ?Constitutional:   ?   General: She is not in acute distress. ?   Appearance: She is well-developed. She is not diaphoretic.  ?   Comments: Ill appearing 88 year old female uncomfortable  ?HENT:  ?   Head: Normocephalic and atraumatic.  ?Eyes:  ?   General:     ?   Right eye: No discharge.     ?   Left eye: No discharge.  ?   Conjunctiva/sclera:  Conjunctivae normal.  ?Neck:  ?   Thyroid: No thyromegaly.  ?Cardiovascular:  ?   Rate and Rhythm: Normal rate. Rhythm irregular.  ?   Heart sounds: Normal heart sounds. No murmur heard. ?Pulmonary:  ?   Effort: No respiratory distress.  ?   Breath sounds: Wheezing and rales present.  ?   Comments: Increased work of breathing ?Musculoskeletal:     ?   General: Normal range of motion.  ?   Cervical back: Normal range of motion and neck supple.  ?   Right lower leg: Edema (+1 baseline) present.  ?   Left lower leg: Edema present.  ?Lymphadenopathy:  ?   Cervical: No cervical adenopathy.  ?Skin: ?   General: Skin is warm and dry.  ?   Findings: No erythema or rash.  ?Neurological:  ?   Mental Status: She is alert and oriented to person, place, and time.  ?Psychiatric:     ?   Behavior: Behavior normal.  ?   Comments: Well groomed, less interactive, does answer  questions but appears tired lethargic  ? ? ? ? ?Results for orders placed or performed during the hospital encounter of 09/18/21  ?Resp Panel by RT-PCR (Flu A&B, Covid) Nasopharyngeal Swab  ? Specimen: Nasopharyngeal Swab; Nasopharyngeal(NP) swabs in vial transport medium  ?Result Value Ref Range  ? SARS Coronavirus 2 by RT PCR NEGATIVE NEGATIVE  ? Influenza A by PCR NEGATIVE NEGATIVE  ? Influenza B by PCR NEGATIVE NEGATIVE  ?Lactic acid, plasma  ?Result Value Ref Range  ? Lactic Acid, Venous 2.6 (HH) 0.5 - 1.9 mmol/L  ?Lactic acid, plasma  ?Result Value Ref Range  ? Lactic Acid, Venous 1.5 0.5 - 1.9 mmol/L  ?CBC  ?Result Value Ref Range  ? WBC 5.7 4.0 - 10.5 K/uL  ? RBC 3.89 3.87 - 5.11 MIL/uL  ? Hemoglobin 11.2 (L) 12.0 - 15.0 g/dL  ? HCT 37.2 36.0 - 46.0 %  ? MCV 95.6 80.0 - 100.0 fL  ? MCH 28.8 26.0 - 34.0 pg  ? MCHC 30.1 30.0 - 36.0 g/dL  ? RDW 15.8 (H) 11.5 - 15.5 %  ? Platelets 209 150 - 400 K/uL  ? nRBC 0.0 0.0 - 0.2 %  ?Basic metabolic panel  ?Result Value Ref Range  ? Sodium 137 135 - 145 mmol/L  ? Potassium 4.4 3.5 - 5.1 mmol/L  ? Chloride 101 98 - 111  mmol/L  ? CO2 24 22 - 32 mmol/L  ? Glucose, Bld 114 (H) 70 - 99 mg/dL  ? BUN 32 (H) 8 - 23 mg/dL  ? Creatinine, Ser 1.44 (H) 0.44 - 1.00 mg/dL  ? Calcium 9.0 8.9 - 10.3 mg/dL  ? GFR, Estimated 35 (L) >60 mL/min  ? Anion gap 12 5 - 15  ?CBC with Differential/Platelet  ?Result Value Ref Range  ? WBC 5.3 4.0 - 10.5 K/uL  ? RBC 3.92 3.87 - 5.11 MIL/uL  ? Hemoglobin 11.3 (L) 12.0 - 15.0 g/dL  ? HCT 37.6 36.0 - 46.0 %  ? MCV 95.9 80.0 - 100.0 fL  ? MCH 28.8 26.0 - 34.0 pg  ? MCHC 30.1 30.0 - 36.0 g/dL  ? RDW 15.8 (H) 11.5 - 15.5 %  ? Platelets 204 150 - 400 K/uL  ? nRBC 0.0 0.0 - 0.2 %  ? Neutrophils Relative % 49 %  ? Neutro Abs 2.6 1.7 - 7.7 K/uL  ? Lymphocytes Relative 34 %  ? Lymphs Abs 1.8 0.7 - 4.0 K/uL  ? Monocytes Relative 8 %  ? Monocytes Absolute 0.5 0.1 - 1.0 K/uL  ? Eosinophils Relative 8 %  ? Eosinophils Absolute 0.4 0.0 - 0.5 K/uL  ? Basophils Relative 1 %  ? Basophils Absolute 0.1 0.0 - 0.1 K/uL  ? Immature Granulocytes 0 %  ? Abs Immature Granulocytes 0.02 0.00 - 0.07 K/uL  ?Brain natriuretic peptide  ?Result Value Ref Range  ? B Natriuretic Peptide 1,107.8 (H) 0.0 - 100.0 pg/mL  ?CK  ?Result Value Ref Range  ? Total CK 173 38 - 234 U/L  ?Procalcitonin - Baseline  ?Result Value Ref Range  ? Procalcitonin 0.15 ng/mL  ?Basic metabolic panel  ?Result Value Ref Range  ? Sodium 136 135 - 145 mmol/L  ? Potassium 4.2 3.5 - 5.1 mmol/L  ? Chloride 102 98 - 111 mmol/L  ? CO2 27 22 - 32 mmol/L  ? Glucose, Bld 78 70 - 99 mg/dL  ? BUN 28 (H) 8 - 23 mg/dL  ? Creatinine, Ser 1.36 (H) 0.44 - 1.00 mg/dL  ?  Calcium 8.7 (L) 8.9 - 10.3 mg/dL  ? GFR, Estimated 37 (L) >60 mL/min  ? Anion gap 7 5 - 15  ?CBC  ?Result Value Ref Range  ? WBC 5.5 4.0 - 10.5 K/uL  ? RBC 3.77 (L) 3.87 - 5.11 MIL/uL  ? Hemoglobin 10.7 (L) 12.0 - 15.0 g/dL  ? HCT 34.3 (L) 36.0 - 46.0 %  ? MCV 91.0 80.0 - 100.0 fL  ? MCH 28.4 26.0 - 34.0 pg  ? MCHC 31.2 30.0 - 36.0 g/dL  ? RDW 15.6 (H) 11.5 - 15.5 %  ? Platelets 198 150 - 400 K/uL  ? nRBC 0.0 0.0 - 0.2 %   ?Basic metabolic panel  ?Result Value Ref Range  ? Sodium 137 135 - 145 mmol/L  ? Potassium 5.5 (H) 3.5 - 5.1 mmol/L  ? Chloride 100 98 - 111 mmol/L  ? CO2 28 22 - 32 mmol/L  ? Glucose, Bld 91 70 - 99 mg/dL  ? BUN 31 (H) 8 - 23 mg/dL  ? Creatinine, Ser 1.34 (H) 0.44 - 1.00 mg/dL  ? Calcium 9.2 8.9 - 10.3 mg/dL  ? GFR, Estimated 38 (L) >60 mL/min  ? Anion gap 9 5 - 15  ?Magnesium  ?Result Value Ref Range  ? Magnesium 1.9 1.7 - 2.4 mg/dL  ?Brain natriuretic peptide  ?Result Value Ref Range  ? B Natriuretic Peptide 1,234.8 (H) 0.0 - 100.0 pg/mL  ?Basic metabolic panel  ?Result Value Ref Range  ? Sodium 137 135 - 145 mmol/L  ? Potassium 3.6 3.5 - 5.1 mmol/L  ? Chloride 98 98 - 111 mmol/L  ? CO2 29 22 - 32 mmol/L  ? Glucose, Bld 83 70 - 99 mg/dL  ? BUN 39 (H) 8 - 23 mg/dL  ? Creatinine, Ser 1.26 (H) 0.44 - 1.00 mg/dL  ? Calcium 8.9 8.9 - 10.3 mg/dL  ? GFR, Estimated 41 (L) >60 mL/min  ? Anion gap 10 5 - 15  ?Magnesium  ?Result Value Ref Range  ? Magnesium 2.2 1.7 - 2.4 mg/dL  ?Troponin I (High Sensitivity)  ?Result Value Ref Range  ? Troponin I (High Sensitivity) 25 (H) <18 ng/L  ? ?   ?Assessment & Plan:  ? ?Problem List Items Addressed This Visit   ? ? COPD with acute exacerbation ([place]) - Primary  ? ?Other Visit Diagnoses   ? ? Hypoxia      ? ?  ?  ?Acute COPD exacerbation ?Initial pulse ox 82% ?Wheezing, abnormal breathing on exam ?No significant edema increase ?Continuous O2 inc from 2 to 3 L in office, improved O2 sat to 94% still difficulty with her breathing ?EMS Called, evaluation and sent to [facility] ED via EMS ? ? ?No orders of the defined types were placed in this encounter. ? ? ? ? ?Follow up plan: ?Return if symptoms worsen or fail to improve. ? ?[name], DO ?[facility] ?[facility] ?11/05/2021, 11:18 AM ?

## 2021-11-05 NOTE — ED Notes (Signed)
Pt changed into gown.

## 2021-11-05 NOTE — Patient Instructions (Addendum)
EMS to Hospital ? ?I am worried about COPD flare with noisy breathing in lungs. You will need X-ray and other imaging, the oxygen level is low and not sufficient, this is because problem with the lungs. May benefit from breathing treatments, steroids, and other medicines to help open up lungs. ? ?I do not see much worsening fluid, this seems less likely to be the heart and fluid. But it is possible. ? ?Please schedule a Follow-up Appointment to: Return if symptoms worsen or fail to improve. ? ?If you have any other questions or concerns, please feel free to call the office or send a message through [place]. You may also schedule an earlier appointment if necessary. ? ?Additionally, you may be receiving a survey about your experience at our office within a few days to 1 week by e-mail or mail. We value your feedback. ? ?[name], DO ?[place] ?

## 2021-11-05 NOTE — Assessment & Plan Note (Addendum)
-   Appreciate pulmonary and oncology input.  Further work-up per them ?

## 2021-11-05 NOTE — Progress Notes (Signed)
Pt transferred via stretcher from the ED to Room 106; VS otbtained, Off unit telemetry placed and verified; pt stable awaiting dinner toi be delivered from the cafeteria ?

## 2021-11-05 NOTE — Assessment & Plan Note (Addendum)
-   continue statin therapy. 

## 2021-11-05 NOTE — Assessment & Plan Note (Addendum)
-   Present on admission.  Improving with treatment of pneumonia with IV Rocephin and Zithromax. ?

## 2021-11-05 NOTE — Sepsis Progress Note (Signed)
Patient has transferred to the telemetry floor. Spoke to bedside nurse regarding sepsis protocol and starting fluids and antibiotics. ?

## 2021-11-05 NOTE — ED Notes (Signed)
Pt placed on pure wick at this time.  

## 2021-11-05 NOTE — Assessment & Plan Note (Addendum)
-    continue Eliquis for now ?

## 2021-11-05 NOTE — Sepsis Progress Note (Signed)
Sepsis protocol is being followed by [facility]. 

## 2021-11-05 NOTE — ED Provider Notes (Signed)
? ? ?[facility] ?Emergency Department Provider Note ? ? ? ? Event Date/Time  ? First MD Initiated Contact with Patient 11/05/21 1209   ?  (approximate) ? ? ?History  ? ?Shortness of Breath and Fever ? ? ?HPI ? ?Kristin [name] is a 88 y.o. female with history of COPD, DDD, chronic pain management, CKD, hypertension, A-fib/flutter on apixaban, and CHF presenting to the ED from her PCP office.  Report from EMS is that patient has been having difficulty breathing since this morning.  Patient is on 2 L of O2 at baseline, but was found to be satting 82% on room air at the office.  Patient also reportedly has some bilateral wheezing.  Patient had reported oral temp of 103 ?F in route from her PCP via EMS. ?  ? ? ?Physical Exam  ? ?Triage Vital Signs: ?ED Triage Vitals [11/05/21 1205]  ?Enc Vitals Group  ?   BP (!) 99/52  ?   Pulse Rate 87  ?   Resp 18  ?   Temp 98.3 ?F (36.8 ?C)  ?   Temp Source Oral  ?   SpO2 95 %  ?   Weight   ?   Height   ?   Head Circumference   ?   Peak Flow   ?   Pain Score   ?   Pain Loc   ?   Pain Edu?   ?   Excl. in [place]?   ? ? ?Most recent vital signs: ?Vitals:  ? 11/06/21 1122 11/06/21 1323  ?BP: 122/64   ?Pulse: 62 (P) 65  ?Resp: 20 (!) (P) 22  ?Temp: 98.1 ?F (36.7 ?C)   ?SpO2: 98% (P) 93%  ? ? ?General Awake, no distress. Alert and talkative ?HEENT NCAT. PERRL. EOMI. No rhinorrhea. Mucous membranes are moist.  ?CV:  Good peripheral perfusion.  ?RESP:  Normal effort. Bilateral wheeze noted  ?ABD:  No distention. Soft, non-tender ? ?ED Results / Procedures / Treatments  ? ?Labs ?(all labs ordered are listed, but only abnormal results are displayed) ?Labs Reviewed  ?BLOOD CULTURE ID PANEL (REFLEXED) - BCID2 - Abnormal; Notable for the following components:  ?    Result Value  ? Enterobacterales DETECTED (*)   ? Escherichia coli DETECTED (*)   ? CTX-M ESBL DETECTED (*)   ? All other components within normal limits  ?BASIC METABOLIC PANEL - Abnormal; Notable for the following  components:  ? BUN 33 (*)   ? Creatinine, Ser 1.67 (*)   ? GFR, Estimated 29 (*)   ? All other components within normal limits  ?CBC WITH DIFFERENTIAL/PLATELET - Abnormal; Notable for the following components:  ? Hemoglobin 11.7 (*)   ? RDW 15.6 (*)   ? Platelets 144 (*)   ? All other components within normal limits  ?BRAIN NATRIURETIC PEPTIDE - Abnormal; Notable for the following components:  ? B Natriuretic Peptide 1,727.9 (*)   ? All other components within normal limits  ?PROTIME-INR - Abnormal; Notable for the following components:  ? Prothrombin Time 16.5 (*)   ? INR 1.3 (*)   ? All other components within normal limits  ?APTT - Abnormal; Notable for the following components:  ? aPTT 37 (*)   ? All other components within normal limits  ?URINALYSIS, COMPLETE (UACMP) WITH MICROSCOPIC - Abnormal; Notable for the following components:  ? Color, Urine YELLOW (*)   ? APPearance CLEAR (*)   ? Hgb urine dipstick SMALL (*)   ?  All other components within normal limits  ?HEMOGLOBIN A1C - Abnormal; Notable for the following components:  ? Hgb A1c MFr Bld 6.1 (*)   ? All other components within normal limits  ?HEPATIC FUNCTION PANEL - Abnormal; Notable for the following components:  ? AST 74 (*)   ? ALT 63 (*)   ? Bilirubin, Direct 0.3 (*)   ? All other components within normal limits  ?BASIC METABOLIC PANEL - Abnormal; Notable for the following components:  ? CO2 21 (*)   ? Glucose, Bld 146 (*)   ? BUN 31 (*)   ? Creatinine, Ser 1.20 (*)   ? Calcium 8.6 (*)   ? GFR, Estimated 43 (*)   ? All other components within normal limits  ?CBC - Abnormal; Notable for the following components:  ? RBC 3.81 (*)   ? Hemoglobin 10.5 (*)   ? HCT 33.6 (*)   ? RDW 15.7 (*)   ? Platelets 126 (*)   ? All other components within normal limits  ?HEPATIC FUNCTION PANEL - Abnormal; Notable for the following components:  ? Total Protein 6.1 (*)   ? Albumin 3.0 (*)   ? AST 49 (*)   ? ALT 49 (*)   ? Bilirubin, Direct 0.3 (*)   ? All other  components within normal limits  ?LACTIC ACID, PLASMA - Abnormal; Notable for the following components:  ? Lactic Acid, Venous 2.1 (*)   ? All other components within normal limits  ?RESP PANEL BY RT-PCR (FLU A&B, COVID) ARPGX2  ?CULTURE, BLOOD (ROUTINE X 2)  ?CULTURE, BLOOD (ROUTINE X 2)  ?URINE CULTURE  ?EXPECTORATED SPUTUM ASSESSMENT W GRAM STAIN, RFLX TO RESP C  ?LACTIC ACID, PLASMA  ?LIPID PANEL  ?LIPASE, BLOOD  ?STREP PNEUMONIAE URINARY ANTIGEN  ?LACTIC ACID, PLASMA  ?LEGIONELLA PNEUMOPHILA SEROGP 1 UR AG  ? ? ? ?EKG ? ?Vent. rate 83 BPM ?PR interval 180 ms ?QRS duration 153 ms ?QT/QTcB 440/518 ms ?P-R-T axes 114 -80 81 ?Evidence of strain over AMI ? ?RADIOLOGY ? ?I personally viewed and evaluated these images as part of my medical decision making, as well as reviewing the written report by the radiologist. ? ?ED Provider Interpretation: reviewed images} ? ?CT CHEST ABDOMEN PELVIS WO CONTRAST ? ?Result Date: 11/05/2021 ?CLINICAL DATA:  Dyspnea. Hypoxia. Sepsis. Wheezing. History of breast cancer. * Tracking Code: BO * EXAM: CT CHEST, ABDOMEN AND PELVIS WITHOUT CONTRAST TECHNIQUE: Multidetector CT imaging of the chest, abdomen and pelvis was performed following the standard protocol without IV contrast. RADIATION DOSE REDUCTION: This exam was performed according to the departmental dose-optimization program which includes automated exposure control, adjustment of the mA and/or kV according to patient size and/or use of iterative reconstruction technique. COMPARISON:  Chest radiograph from earlier today. 01/08/2021 chest CT. 06/19/2010 PET-CT. FINDINGS: CT CHEST FINDINGS Cardiovascular: Mild-to-moderate cardiomegaly. Two lead left subclavian pacemaker with lead tips in the right atrium and right ventricular apex. No significant pericardial effusion/thickening. 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 Dilated main pulmonary artery (3.9 cm diameter). Three-vessel coronary  atherosclerosis. Mediastinum/Nodes: No discrete thyroid nodules. Unremarkable esophagus. No pathologically enlarged axillary, mediastinal or hilar lymph nodes, noting limited sensitivity for the detection of hilar adenopathy on this noncontrast study. Surgical clips again noted in the right axilla. Lungs/Pleura: No pneumothorax. No pleural effusion. Moderate centrilobular and paraseptal emphysema. Solid 4.5 x 4.2 cm posterior right upper lobe lung mass (series 4/image 60), new. Posterior right upper lobe 0.5 cm solid pulmonary nodule (  series 4/image 60), stable, considered benign. Two new indistinct peribronchovascular pulmonary nodules in the superior segment right lower lobe, largest 1.0 cm (series 4/image 89). Peripheral left mid lung 0.6 cm solid pulmonary nodule (series 4/image 83), stable, considered benign. Chronic small fat containing Bochdalek's hernia at the posterior left lung base. Moderate patchy subpleural reticulation and ground-glass opacity with associated mild traction bronchiectasis at the lung bases, similar. Musculoskeletal: No aggressive appearing focal osseous lesions. Bilateral total shoulder arthroplasty, partially visualized. Mild thoracic spondylosis. CT ABDOMEN PELVIS FINDINGS Hepatobiliary: Normal liver with no liver mass. Cholecystectomy. Bile ducts are within normal post cholecystectomy limits with CBD diameter 7 mm. Scattered mild pneumobilia in the left liver, presumably due to a history of sphincterotomy. Hyperdense 1.0 cm focus in the lower third of the common bile duct adjacent to the region of the ampulla (series 2/image 70), cannot exclude choledocholithiasis. Pancreas: Generalized mild pancreatic duct dilation (3 mm diameter), minimally more prominent since 07/27/2010 MRI abdomen. No pancreatic mass. Spleen: Normal size. No mass. Adrenals/Urinary Tract: No discrete adrenal nodules. No hydronephrosis. No renal stones. Simple 1.7 cm interpolar left renal cyst. Subcentimeter  hyperdense renal cortical lesion in the lower right kidney requires no follow-up. normal bladder. Stomach/Bowel: Normal non-distended stomach. Normal caliber small bowel with no small bowel wall thickening. Appendix not

## 2021-11-05 NOTE — Consult Note (Signed)
? ? ? ?PULMONOLOGY ? ? ? ? ? ? ? ? ?Date: 11/05/2021,   ?MRN# [id number] Kristin [name] September 17, 1929 ? ? ?  ?AdmissionWeight: 77.2 kg                 ?CurrentWeight: 77.2 kg ? ? ?Referring physician: Dr Sidney Ace ? ? ?CHIEF COMPLAINT:  ? ?Lung mass and mediastinal lymphadenopathy ? ? ?HISTORY OF PRESENT ILLNESS  ? ?This is a pleasant 88 yr old F, she quit smoking 35 years ago per patient and is in good health not demented. We talked about her medical history which is listed belowe in [facility] history.  She clearly states she wants everything done.  She has reviewed chest imaging with me and her wishes are to have full scope of therapy.  ? ?She has asked me to discuss these findings with her daughter Kristin [name] who is a Education officer, museum.  ? ?She does uses oxygen at home at 2L/min nasal canula and states this is chronic at least 2 years using mostly nocturnally and recently started using during daytime.  ? ?She is ambulatory and stable.  ? ? ?PAST MEDICAL HISTORY  ? ?Past Medical History:  ?Diagnosis Date  ?? Acute postoperative pain 02/09/2018  ?? Arrhythmia, sinus node 04/03/2014  ?? Arthritis   ?? Arthritis   ?? Atrial fibrillation ([place])   ?? Back pain   ? lower back chronic  ?? Bradycardia   ?? Breast cancer ([place]) 2004  ? right breast cancer  ?? Cancer [facility]) 2004  ? rt breast cancer-post lumpectomy- chemo/rad  ?? CHF (congestive heart failure) ([place])   ?? Chronic back pain   ?? CKD (chronic kidney disease)   ?? COPD (chronic obstructive pulmonary disease) ([place])   ? wears O2 at 2L via [place] at night  ?? COVID-19 06/11/2020  ?? Cystocele   ?? Decubitus ulcers   ?? Dehydration   ?? Gout   ?? Hematuria   ?? Hepatitis C   ?? Hyperlipidemia   ?? Hypertension   ?? Hypomagnesemia 06/25/2015  ?? NSTEMI (non-ST elevated myocardial infarction) ([place]) 04/08/2021  ?? OAB (overactive bladder)   ?? Overactive bladder   ?? Overdose opiate, accidental or unintentional, sequela 10/02/2020  ?? Restless leg   ?? Shoulder pain, bilateral   ?? Urinary  frequency   ?? UTI (lower urinary tract infection)   ?? Vaginal atrophy   ? ? ? ?SURGICAL HISTORY  ? ?Past Surgical History:  ?Procedure Laterality Date  ?? ABDOMINAL HYSTERECTOMY    ?? BACK SURGERY    ?? BREAST LUMPECTOMY Right 2004  ?? BREAST SURGERY    ? rt lumpectomy  ?? CARPAL TUNNEL RELEASE    ?? CATARACT EXTRACTION W/PHACO  10/19/2011  ? Procedure: CATARACT EXTRACTION PHACO AND INTRAOCULAR LENS PLACEMENT (IOC);  Surgeon: Elta Guadeloupe T. Gershon Crane, MD;  Location: [facility];  Service: Ophthalmology;  Laterality: Right;  CDE:10.81  ?? CATARACT EXTRACTION W/PHACO  11/02/2011  ? Procedure: CATARACT EXTRACTION PHACO AND INTRAOCULAR LENS PLACEMENT (IOC);  Surgeon: Elta Guadeloupe T. Gershon Crane, MD;  Location: [facility];  Service: Ophthalmology;  Laterality: Left;  CDE 8.60  ?? CHOLECYSTECTOMY    ? 3/18  ?? COLON SURGERY    ?? INSERT / REPLACE / REMOVE PACEMAKER    ?? JOINT REPLACEMENT    ? bilateral TKA  ?? LOWER EXTREMITY ANGIOGRAPHY Left 10/14/2020  ? Procedure: LOWER EXTREMITY ANGIOGRAPHY;  Surgeon: Katha Cabal, MD;  Location: [place] [facility];  Service: Cardiovascular;  Laterality: Left;  ??  LOWER EXTREMITY ANGIOGRAPHY Right 09/08/2021  ? Procedure: LOWER EXTREMITY ANGIOGRAPHY;  Surgeon: Katha Cabal, MD;  Location: [place] [facility];  Service: Cardiovascular;  Laterality: Right;  ?? PACEMAKER INSERTION N/A 12/24/2015  ? Procedure: INSERTION PACEMAKER;  Surgeon: Isaias Cowman, MD;  Location: [facility];  Service: Cardiovascular;  Laterality: N/A;  ?? ROTATOR CUFF REPAIR    ? bilateral  ? ? ? ?FAMILY HISTORY  ? ?Family History  ?Problem Relation Age of Onset  ?? Kidney disease Brother   ?? Heart disease Father   ?? Heart disease Mother   ?? Stroke Mother   ?? Cancer Sister   ?? Breast cancer Sister 55  ?? Breast cancer Paternal Aunt   ?? Anesthesia problems Neg Hx   ?? Hypotension Neg Hx   ?? Malignant hyperthermia Neg Hx   ?? Pseudochol deficiency Neg Hx   ? ? ? ?SOCIAL HISTORY  ? ?Social History  ? ?Tobacco Use  ?? Smoking  status: Former  ?  Packs/day: 1.25  ?  Years: 40.00  ?  Pack years: 50.00  ?  Types: Cigarettes  ?  Quit date: 12/16/1998  ?  Years since quitting: 22.9  ?? Smokeless tobacco: Never  ?Vaping Use  ?? Vaping Use: Never used  ?Substance Use Topics  ?? Alcohol use: No  ?  Alcohol/week: 0.0 standard drinks  ?? Drug use: Yes  ?  Types: Oxycodone  ? ? ? ?MEDICATIONS  ? ? ?Home Medication:  ?Current Outpatient Rx  ?? Order #: [id number] Class: Historical Med  ?? Order #: [id number] Class: Normal  ?? Order #: [id number] Class: Normal  ?? Order #: [id number] Class: Normal  ?? Order #: [id number] Class: Normal  ?? Order #: [id number] Class: Historical Med  ?? Order #: [id number] Class: Normal  ?? Order #: [id number] Class: Normal  ?? Order #: [id number] Class: Normal  ?? Order #: [id number] Class: Normal  ?? Order #: [id number] Class: Normal  ?? Order #: [id number] Class: Historical Med  ?? Order #: [id number] Class: Normal  ?? Order #: [id number] Class: Normal  ?? Order #: [id number] Class: Print  ?? Order #: [id number] Class: Historical Med  ?  ?Current Medication: ? ?Current Facility-Administered Medications:  ??  0.9 %  sodium chloride infusion, , Intravenous, Continuous, Menshew, Jenise V Bacon, PA-C, Last Rate: 20 mL/hr at 11/05/21 1314, New Bag at 11/05/21 1314 ? ?Current Outpatient Medications:  ??  acetaminophen (TYLENOL) 500 MG tablet, Take 500 mg by mouth in the morning and at bedtime., Disp: , Rfl:  ??  albuterol (PROVENTIL) (2.5 MG/3ML) 0.083% nebulizer solution, USE 1 VIAL IN NEBULIZER EVERY 8 HOURS AS NEEDED FOR WHEEZING OR SHORTNESS OF BREATH., Disp: 75 mL, Rfl: 0 ??  apixaban (ELIQUIS) 2.5 MG TABS tablet, Take 1 tablet (2.5 mg total) by mouth 2 (two) times daily., Disp: 60 tablet, Rfl: 0 ??  atorvastatin (LIPITOR) 20 MG tablet, TAKE 1 TABLET BY MOUTH ONCE DAILY FOR CHOLESTEROL, Disp: 30 tablet, Rfl: 11 ??  bisacodyl (DULCOLAX) 5 MG EC tablet, Take 2 tablets (10 mg total) by mouth daily as needed for moderate constipation., Disp: 180 tablet,  Rfl: 1 ??  Cholecalciferol (VITAMIN D) 2000 units tablet, Take 2,000 Units by mouth daily. , Disp: , Rfl:  ??  diclofenac Sodium (VOLTAREN) 1 % GEL, Apply 4 g topically 2 (two) times daily as needed (As needed for pain)., Disp: 100 g, Rfl: 1 ??  furosemide (LASIX) 40 MG tablet, Take 1 tablet (40 mg total) by mouth daily., Disp: 30 tablet, Rfl: 1 ??  gabapentin (NEURONTIN) 100 MG capsule,  TAKE 3 CAPSULES BY MOUTH AT BEDTIME, Disp: 270 capsule, Rfl: 0 ??  GNP ASPIRIN LOW DOSE 81 MG EC tablet, TAKE 1 TABLET BY MOUTH ONCE DAILY. *SWALLOW WHOLE*, Disp: 150 tablet, Rfl: 11 ??  levocetirizine (XYZAL) 5 MG tablet, TAKE (1/2) TABLET BY MOUTH EVERY OTHER DAY., Disp: 8 tablet, Rfl: 9 ??  Melatonin 10 MG TABS, Take 10 mg by mouth at bedtime., Disp: , Rfl:  ??  oxyCODONE (OXY IR/ROXICODONE) 5 MG immediate release tablet, Take 2 tablets (10 mg total) by mouth 2 (two) times daily. May also take 1 tablet (5 mg total) at bedtime as needed for severe pain. Must last 30 days.., Disp: 150 tablet, Rfl: 0 ??  rOPINIRole (REQUIP) 2 MG tablet, TAKE 2 TABLETS BY MOUTH AT BEDTIME, Disp: 180 tablet, Rfl: 1 ??  BREZTRI AEROSPHERE 160-9-4.8 MCG/ACT AERO, Inhale 2 puffs into the lungs 2 (two) times daily., Disp: 10.7 g, Rfl: 5 ??  OXYGEN, Inhale 2 L into the lungs daily., Disp: , Rfl:  ? ? ? ?ALLERGIES  ? ?Flexeril [cyclobenzaprine hcl] and Vancomycin ? ? ? ? ?REVIEW OF SYSTEMS  ? ? ?Review of Systems: ? ?Gen:  Denies  fever, sweats, chills weigh loss  ?HEENT: Denies blurred vision, double vision, ear pain, eye pain, hearing loss, nose bleeds, sore throat ?Cardiac:  No dizziness, chest pain or heaviness, chest tightness,edema ?Resp:   Denies cough or sputum porduction, shortness of breath,wheezing, hemoptysis,  ?Gi: Denies swallowing difficulty, stomach pain, nausea or vomiting, diarrhea, constipation, bowel incontinence ?Gu:  Denies bladder incontinence, burning urine ?Ext:   Denies Joint pain, stiffness or swelling ?Skin: Denies  skin rash,  easy bruising or bleeding or hives ?Endoc:  Denies polyuria, polydipsia , polyphagia or weight change ?Psych:   Denies depression, insomnia or hallucinations  ? ?Other:  All other systems negative ? ? ?VS: BP

## 2021-11-06 DIAGNOSIS — Z87891 Personal history of nicotine dependence: Secondary | ICD-10-CM | POA: Diagnosis not present

## 2021-11-06 DIAGNOSIS — R911 Solitary pulmonary nodule: Secondary | ICD-10-CM

## 2021-11-06 DIAGNOSIS — I480 Paroxysmal atrial fibrillation: Secondary | ICD-10-CM | POA: Diagnosis not present

## 2021-11-06 DIAGNOSIS — Z789 Other specified health status: Secondary | ICD-10-CM

## 2021-11-06 DIAGNOSIS — Z515 Encounter for palliative care: Secondary | ICD-10-CM

## 2021-11-06 DIAGNOSIS — Z1612 Extended spectrum beta lactamase (ESBL) resistance: Secondary | ICD-10-CM

## 2021-11-06 DIAGNOSIS — Z853 Personal history of malignant neoplasm of breast: Secondary | ICD-10-CM

## 2021-11-06 DIAGNOSIS — Z923 Personal history of irradiation: Secondary | ICD-10-CM | POA: Diagnosis not present

## 2021-11-06 DIAGNOSIS — I5032 Chronic diastolic (congestive) heart failure: Secondary | ICD-10-CM

## 2021-11-06 DIAGNOSIS — J189 Pneumonia, unspecified organism: Secondary | ICD-10-CM | POA: Diagnosis not present

## 2021-11-06 DIAGNOSIS — R7881 Bacteremia: Secondary | ICD-10-CM | POA: Insufficient documentation

## 2021-11-06 DIAGNOSIS — Z9221 Personal history of antineoplastic chemotherapy: Secondary | ICD-10-CM

## 2021-11-06 DIAGNOSIS — A419 Sepsis, unspecified organism: Secondary | ICD-10-CM | POA: Diagnosis not present

## 2021-11-06 DIAGNOSIS — Z7901 Long term (current) use of anticoagulants: Secondary | ICD-10-CM

## 2021-11-06 DIAGNOSIS — R918 Other nonspecific abnormal finding of lung field: Secondary | ICD-10-CM | POA: Diagnosis not present

## 2021-11-06 DIAGNOSIS — J9601 Acute respiratory failure with hypoxia: Secondary | ICD-10-CM | POA: Diagnosis not present

## 2021-11-06 DIAGNOSIS — A499 Bacterial infection, unspecified: Secondary | ICD-10-CM

## 2021-11-06 LAB — BLOOD CULTURE ID PANEL (REFLEXED) - BCID2

## 2021-11-06 LAB — CBC
HCT: 33.6 % — ABNORMAL LOW (ref 36.0–46.0)
Hemoglobin: 10.5 g/dL — ABNORMAL LOW (ref 12.0–15.0)
MCH: 27.6 pg (ref 26.0–34.0)
MCHC: 31.3 g/dL (ref 30.0–36.0)
MCV: 88.2 fL (ref 80.0–100.0)
Platelets: 126 10*3/uL — ABNORMAL LOW (ref 150–400)
RBC: 3.81 MIL/uL — ABNORMAL LOW (ref 3.87–5.11)
RDW: 15.7 % — ABNORMAL HIGH (ref 11.5–15.5)
WBC: 7 10*3/uL (ref 4.0–10.5)
nRBC: 0 % (ref 0.0–0.2)

## 2021-11-06 LAB — HEPATIC FUNCTION PANEL
ALT: 49 U/L — ABNORMAL HIGH (ref 0–44)
AST: 49 U/L — ABNORMAL HIGH (ref 15–41)
Albumin: 3 g/dL — ABNORMAL LOW (ref 3.5–5.0)
Alkaline Phosphatase: 92 U/L (ref 38–126)
Bilirubin, Direct: 0.3 mg/dL — ABNORMAL HIGH (ref 0.0–0.2)
Indirect Bilirubin: 0.6 mg/dL (ref 0.3–0.9)
Total Bilirubin: 0.9 mg/dL (ref 0.3–1.2)
Total Protein: 6.1 g/dL — ABNORMAL LOW (ref 6.5–8.1)

## 2021-11-06 LAB — BASIC METABOLIC PANEL
Anion gap: 8 (ref 5–15)
BUN: 31 mg/dL — ABNORMAL HIGH (ref 8–23)
CO2: 21 mmol/L — ABNORMAL LOW (ref 22–32)
Calcium: 8.6 mg/dL — ABNORMAL LOW (ref 8.9–10.3)
Chloride: 106 mmol/L (ref 98–111)
Creatinine, Ser: 1.2 mg/dL — ABNORMAL HIGH (ref 0.44–1.00)
GFR, Estimated: 43 mL/min — ABNORMAL LOW (ref >60)
Glucose, Bld: 146 mg/dL — ABNORMAL HIGH (ref 70–99)
Potassium: 4.2 mmol/L (ref 3.5–5.1)
Sodium: 135 mmol/L (ref 135–145)

## 2021-11-06 LAB — HEMOGLOBIN A1C
Hgb A1c MFr Bld: 6.1 % — ABNORMAL HIGH (ref 4.8–5.6)
Mean Plasma Glucose: 128 mg/dL

## 2021-11-06 MED ORDER — IPRATROPIUM-ALBUTEROL 0.5-2.5 (3) MG/3ML IN SOLN
3.0000 mL | Freq: Three times a day (TID) | RESPIRATORY_TRACT | Status: DC
Start: 2021-11-06 — End: 2021-11-12
  Administered 2021-11-06 – 2021-11-12 (×16): 3 mL via RESPIRATORY_TRACT
  Filled 2021-11-06 (×17): qty 3

## 2021-11-06 MED ORDER — SODIUM CHLORIDE 0.9 % IV SOLN
1.0000 g | Freq: Two times a day (BID) | INTRAVENOUS | Status: AC
  Administered 2021-11-06 – 2021-11-10 (×10): 1 g via INTRAVENOUS
  Filled 2021-11-06: qty 1
  Filled 2021-11-06: qty 20
  Filled 2021-11-06 (×3): qty 1
  Filled 2021-11-06: qty 20
  Filled 2021-11-06 (×2): qty 1
  Filled 2021-11-06: qty 20
  Filled 2021-11-06 (×2): qty 1

## 2021-11-06 MED ORDER — ALBUTEROL SULFATE (2.5 MG/3ML) 0.083% IN NEBU
2.5000 mg | INHALATION_SOLUTION | RESPIRATORY_TRACT | Status: DC | PRN
  Administered 2021-11-06 (×2): 2.5 mg via RESPIRATORY_TRACT
  Filled 2021-11-06 (×2): qty 3

## 2021-11-06 NOTE — Progress Notes (Signed)
Initial Nutrition Assessment ? ?DOCUMENTATION CODES:  ? ?Not applicable ? ?INTERVENTION:  ?- Liberalize diet from a heart healthy to a 2gm sodium diet to provide wider variety of menu options to enhance nutritional adequacy given acute illness and advanced age ? ?- Ensure Enlive po BID, each supplement provides 350 kcal and 20 grams of protein. ? ?- MVI with minerals daily ? ?NUTRITION DIAGNOSIS:  ? ?Increased nutrient needs related to acute illness as evidenced by estimated needs. ? ?GOAL:  ? ?Patient will meet greater than or equal to 90% of their needs ? ?MONITOR:  ? ?PO intake, Supplement acceptance, Labs, Weight trends, I & O's, Skin ? ?REASON FOR ASSESSMENT:  ? ?Consult ?Assessment of nutrition requirement/status ? ?ASSESSMENT:  ? ?Pt admitted from home with SOB and fever secondary to postobstructive pneumonia. PMH significant for CHF, COPD, hepatitis C, dyslipidemia, CAD, HTN, and afib on Eliquis. ? ?Chest xray revealed new solid 4.5 cm posterior R upper lobe lung mass, suspicious for primary bronchogenic carcinoma. Per Pulmonology, plans for biopsy. ? ?Pt reports no changes in appetite or PO intake from her baseline. She typically eats a late breakfast consisting of pancakes or cereal with milk and a banana, coffee and sometimes sausage, does not eat lunch d/t late breakfast and a large dinner consisting of food items such as fried chicken, hamburgers, potato salad, green beans and other various items. Pt also states that she monitors her sodium intake. Her family is helpful with encouraging her to eat more and purchased protein shakes for her of which she usually drinks 2 a day.  ? ?Pt states that she weighs herself everyday d/t CHF. She reports a usual dry weight of 158 lbs. Pt was admitted to hospital with BLE edema in January 2023 and reports having a weight loss of 20 lbs d/t diuresis. Per review of chart, pt with weight fluctuations but unable to determine true dry weight losses.  ? ?Pt with mild  pitting edema to both feet.  ? ?Per outpatient cardiology records on 2/3, pt's baseline dry weight noted to be 155-157 lbs. Will use this weight to calculate estimated needs as pt's weight is elevated d/t edema. ? ?Suspect there may be a degree of underlying malnutrition as pt is noted to have mild fat and moderate muscle depletions, however some losses could be d/t her advanced age. Given reports of baseline PO intake and no significant dry weight loss cannot diagnose at this time but will continue to reassess throughout admission. ? ?Medications: vitamin D3, lasix, melatonin, prednisone, IV merrem ? ?Labs: BUN 31, Cr 1.20, AST 49, ALT 49 ? ?I/O's: -1166ml since admission ? ?NUTRITION - FOCUSED PHYSICAL EXAM: ? ?Flowsheet Row Most Recent Value  ?Orbital Region Moderate depletion  ?Upper Arm Region Mild depletion  ?Thoracic and Lumbar Region Mild depletion  ?Buccal Region Mild depletion  ?Temple Region Moderate depletion  ?Clavicle Bone Region Moderate depletion  ?Clavicle and Acromion Bone Region Severe depletion  ?Scapular Bone Region Moderate depletion  ?Dorsal Hand Severe depletion  ?Patellar Region Moderate depletion  ?Anterior Thigh Region Moderate depletion  ?Posterior Calf Region Moderate depletion  ?Edema (RD Assessment) Mild  [pitting BLE]  ?Hair Reviewed  ?Eyes Reviewed  ?Mouth Reviewed  ?Skin Reviewed  ?Nails Reviewed  ? ?  ? ? ?Diet Order:   ?Diet Order   ? ?       ?  Diet Heart Room service appropriate? Yes; Fluid consistency: Thin  Diet effective now       ?  ? ?  ?  ? ?  ? ? ?  EDUCATION NEEDS:  ? ?Education needs have been addressed ? ?Skin:  Skin Assessment: Skin Integrity Issues: ?Skin Integrity Issues:: Diabetic Ulcer ?Diabetic Ulcer: L ankle (open) ? ?Last BM:  3/16 ? ?Height:  ? ?Ht Readings from Last 1 Encounters:  ?11/05/21 5\' 7"  (1.702 m)  ? ? ?Weight:  ? ?Wt Readings from Last 1 Encounters:  ?11/05/21 77.2 kg  ? ?BMI:  Body mass index is 26.67 kg/m?. ? ?Estimated Nutritional Needs:  ? ?Kcal:   1600-1800 ? ?Protein:  80-95g ? ?Fluid:  >/=1.6L ? ?Clayborne Dana, RDN, LDN ?Clinical Nutrition ?

## 2021-11-06 NOTE — Evaluation (Signed)
Physical Therapy Evaluation ?Patient Details ?Name: Kristin [name] ?MRN: [id number] ?DOB: 03-09-1930 ?Today's Date: 11/06/2021 ? ?History of Present Illness ? Pt is a 88 y/o F admitted on 11/05/21 after presenting with c/c of SOB. fever & chills. CT showed posterior right upper lobe lung mass, new, suspicious for primary bronchogenic carcinoma & two new indistinct peribronchovascular pulmonary nodules in the superior segment right lower lobe. Pt is being treated for postobstructive PNA. PMH: CHF, COPD, Hep C, dyslipidemia, CAD, HTN, paroxysmal a-fib on Eliquis, breast CA, CKD, NSTEMI  ?Clinical Impression ? Pt seen for PT evaluation with pt reporting prior to admission she was ambulatory with a [facility] & she lives with her son who is home until he goes to work until [employer]. On this date, pt is able to ambulate household distances with RW & CGA<>supervision. Pt does require 2L/min via nasal cannula during session with lowest SpO2 of 89%. Anticipate pt can d/c home with HHPT f/u. Will continue to follow pt acutely to address endurance, balance & gait with LRAD.  ?   ? ?Recommendations for follow up therapy are one component of a multi-disciplinary discharge planning process, led by the attending physician.  Recommendations may be updated based on patient status, additional functional criteria and insurance authorization. ? ?Follow Up Recommendations Home health PT ? ?  ?Assistance Recommended at Discharge Intermittent Supervision/Assistance  ?Patient can return home with the following ? A little help with walking and/or transfers;A little help with bathing/dressing/bathroom;Assistance with cooking/housework;Assist for transportation;Direct supervision/assist for financial management;Direct supervision/assist for medications management ? ?  ?Equipment Recommendations Rolling walker (2 wheels)  ?Recommendations for Other Services ?    ?  ?Functional Status Assessment Patient has had a recent decline in their functional status and  demonstrates the ability to make significant improvements in function in a reasonable and predictable amount of time.  ? ?  ?Precautions / Restrictions Precautions ?Precautions: Fall ?Restrictions ?Weight Bearing Restrictions: No  ? ?  ? ?Mobility ? Bed Mobility ?  ?  ?  ?  ?  ?  ?  ?General bed mobility comments: not observed, pt received & left sitting in recliner ?  ? ?Transfers ?Overall transfer level: Needs assistance ?Equipment used: Rolling walker (2 wheels) ?Transfers: Sit to/from Stand ?Sit to Stand: Supervision ?  ?  ?  ?  ?  ?General transfer comment: Poor eccentric control with stand>sit ?  ? ?Ambulation/Gait ?Ambulation/Gait assistance: Supervision, Min guard ?Gait Distance (Feet): 100 Feet ?Assistive device: Rolling walker (2 wheels) ?Gait Pattern/deviations: Decreased step length - right, Decreased step length - left, Decreased stride length ?Gait velocity: decreased ?  ?  ?  ? ?Stairs ?  ?  ?  ?  ?  ? ?Wheelchair Mobility ?  ? ?Modified Rankin (Stroke Patients Only) ?  ? ?  ? ?Balance Overall balance assessment: Needs assistance ?Sitting-balance support: Feet supported ?Sitting balance-Leahy Scale: Good ?  ?  ?Standing balance support: During functional activity, Bilateral upper extremity supported ?Standing balance-Leahy Scale: Fair ?  ?  ?  ?  ?  ?  ?  ?  ?  ?  ?  ?  ?   ? ? ? ?Pertinent Vitals/Pain Pain Assessment ?Pain Assessment: No/denies pain  ? ? ?Home Living Family/patient expects to be discharged to:: Private residence ?Living Arrangements: Children ?Available Help at Discharge: Family;Available PRN/intermittently (son goes to work at [employer] each day) ?Type of Home: House ?Home Access: Level entry ?  ?  ?  ?Home Layout: Able to  live on main level with bedroom/bathroom;One level;Laundry or work area in basement ?Home Equipment: Shower seat;Cane - single point;Grab bars - toilet;Grab bars - tub/shower;Rollator (4 wheels) ?   ?  ?Prior Function Prior Level of Function : Independent/Modified  Independent ?  ?  ?  ?  ?  ?  ?Mobility Comments: ambulatory with SPC ?  ?  ? ? ?Hand Dominance  ?   ? ?  ?Extremity/Trunk Assessment  ? Upper Extremity Assessment ?Upper Extremity Assessment: Overall WFL for tasks assessed ?  ? ?Lower Extremity Assessment ?Lower Extremity Assessment: Generalized weakness ?  ? ?   ?Communication  ? Communication: No difficulties  ?Cognition Arousal/Alertness: Awake/alert ?Behavior During Therapy: [facility] for tasks assessed/performed ?Overall Cognitive Status: Within Functional Limits for tasks assessed ?  ?  ?  ?  ?  ?  ?  ?  ?  ?  ?  ?  ?  ?  ?  ?  ?General Comments: Pleasant lady ?  ?  ? ?  ?General Comments   ? ?  ?Exercises    ? ?Assessment/Plan  ?  ?PT Assessment Patient needs continued PT services  ?PT Problem List Decreased strength;Decreased mobility;Decreased activity tolerance;Decreased balance;Cardiopulmonary status limiting activity;Decreased knowledge of use of DME ? ?   ?  ?PT Treatment Interventions Therapeutic exercise;DME instruction;Gait training;Balance training;Neuromuscular re-education;Functional mobility training;Therapeutic activities;Patient/family education   ? ?PT Goals (Current goals can be found in the Care Plan section)  ?Acute Rehab PT Goals ?Patient Stated Goal: get better, return to PLOF ?PT Goal Formulation: With patient ?Time For Goal Achievement: 11/20/21 ?Potential to Achieve Goals: Good ? ?  ?Frequency Min 2X/week ?  ? ? ?Co-evaluation   ?  ?  ?  ?  ? ? ?  ?AM-PAC PT "6 Clicks" Mobility  ?Outcome Measure Help needed turning from your back to your side while in a flat bed without using bedrails?: None ?Help needed moving from lying on your back to sitting on the side of a flat bed without using bedrails?: A Little ?Help needed moving to and from a bed to a chair (including a wheelchair)?: A Little ?Help needed standing up from a chair using your arms (e.g., wheelchair or bedside chair)?: A Little ?Help needed to walk in hospital room?: A Little ?Help  needed climbing 3-5 steps with a railing? : A Little ?6 Click Score: 19 ? ?  ?End of Session Equipment Utilized During Treatment: Oxygen ?Activity Tolerance: Patient tolerated treatment well ?Patient left: in chair;with call bell/phone within reach;with chair alarm set;with family/visitor present ?  ?PT Visit Diagnosis: Muscle weakness (generalized) (M62.81) ?  ? ?Time: 5790-3833 ?PT Time Calculation (min) (ACUTE ONLY): 14 min ? ? ?Charges:   PT Evaluation ?$PT Eval Low Complexity: 1 Low ?  ?  ?   ? ? ?Lavone Nian, PT, DPT ?11/06/21, 3:41 PM ? ? ?[name] ?11/06/2021, 3:37 PM ? ?

## 2021-11-06 NOTE — Progress Notes (Signed)
PHARMACY - PHYSICIAN COMMUNICATION ?CRITICAL VALUE ALERT - BLOOD CULTURE IDENTIFICATION (BCID) ? ?Kristin [name] is an 88 y.o. female who presented to [facility] on 11/05/2021 with a chief complaint of PNA, sepsis.  ? ?Assessment:  E Coli in 1 of 4 bottles (anaerobic) , CTM-X resistance detected.  (include suspected source if known) ? ?Name of physician (or Provider) Contacted: Rachael Fee, NP  ? ?Current antibiotics: Ceftriaxone, Doxycycline  ? ?Changes to prescribed antibiotics recommended:  ?Will d/c ceftriaxone and doxy and start meropenem 1 gm IV Q12H.  ? ?Results for orders placed or performed during the hospital encounter of 11/05/21  ?Blood Culture ID Panel (Reflexed) (Collected: 11/05/2021 12:17 PM)  ?Result Value Ref Range  ? Enterococcus faecalis NOT DETECTED NOT DETECTED  ? Enterococcus Faecium NOT DETECTED NOT DETECTED  ? Listeria monocytogenes NOT DETECTED NOT DETECTED  ? Staphylococcus species NOT DETECTED NOT DETECTED  ? Staphylococcus aureus (BCID) NOT DETECTED NOT DETECTED  ? Staphylococcus epidermidis NOT DETECTED NOT DETECTED  ? Staphylococcus lugdunensis NOT DETECTED NOT DETECTED  ? Streptococcus species NOT DETECTED NOT DETECTED  ? Streptococcus agalactiae NOT DETECTED NOT DETECTED  ? Streptococcus pneumoniae NOT DETECTED NOT DETECTED  ? Streptococcus pyogenes NOT DETECTED NOT DETECTED  ? A.calcoaceticus-baumannii NOT DETECTED NOT DETECTED  ? Bacteroides fragilis NOT DETECTED NOT DETECTED  ? Enterobacterales DETECTED (A) NOT DETECTED  ? Enterobacter cloacae complex NOT DETECTED NOT DETECTED  ? Escherichia coli DETECTED (A) NOT DETECTED  ? Klebsiella aerogenes NOT DETECTED NOT DETECTED  ? Klebsiella oxytoca NOT DETECTED NOT DETECTED  ? Klebsiella pneumoniae NOT DETECTED NOT DETECTED  ? Proteus species NOT DETECTED NOT DETECTED  ? Salmonella species NOT DETECTED NOT DETECTED  ? Serratia marcescens NOT DETECTED NOT DETECTED  ? Haemophilus influenzae NOT DETECTED NOT DETECTED  ? Neisseria meningitidis  NOT DETECTED NOT DETECTED  ? Pseudomonas aeruginosa NOT DETECTED NOT DETECTED  ? Stenotrophomonas maltophilia NOT DETECTED NOT DETECTED  ? Candida albicans NOT DETECTED NOT DETECTED  ? Candida auris NOT DETECTED NOT DETECTED  ? Candida glabrata NOT DETECTED NOT DETECTED  ? Candida krusei NOT DETECTED NOT DETECTED  ? Candida parapsilosis NOT DETECTED NOT DETECTED  ? Candida tropicalis NOT DETECTED NOT DETECTED  ? Cryptococcus neoformans/gattii NOT DETECTED NOT DETECTED  ? CTX-M ESBL DETECTED (A) NOT DETECTED  ? Carbapenem resistance IMP NOT DETECTED NOT DETECTED  ? Carbapenem resistance KPC NOT DETECTED NOT DETECTED  ? Carbapenem resistance NDM NOT DETECTED NOT DETECTED  ? Carbapenem resist OXA 48 LIKE NOT DETECTED NOT DETECTED  ? Carbapenem resistance VIM NOT DETECTED NOT DETECTED  ? ? ?[name] ?11/06/2021  2:15 AM ? ?

## 2021-11-06 NOTE — Evaluation (Signed)
Occupational Therapy Evaluation ?Patient Details ?Name: Kristin [name] ?MRN: [id number] ?DOB: June 07, 1930 ?Today's Date: 11/06/2021 ? ? ?History of Present Illness 88 y.o. female admitted with aute onset of dyspnea and hypoxemia at PCPs office. Onocology and pulonary consulted for R upper lobe mass concerning for malignancy. PMH significant for with medical history significant for CHF, COPD, hepatitis C, dyslipidemia, coronary artery disease, hypertension, dyslipidemia and paroxysmal atrial fibrillation on Eliquis.  ? ?Clinical Impression ?  ?Chart reviewed, nurse cleared pt for participation in OT evaluation. Pt is pleasant, agreeable to OT evaluation. Pt presents with and endorses with mild generalized weakness as compared to PLOF. Pt required vcs throughout for energy conservation techniques during ADL completion as she is typically MOD I for ADLs. She lives with her son who provides assistance for IADLs as needed per pt report. Recommend HHOT following discharge. Will continue to follow acutely. Pt is left in bedside chair, NAD, all needs met. Nurse aware of pt status.  ?   ? ?Recommendations for follow up therapy are one component of a multi-disciplinary discharge planning process, led by the attending physician.  Recommendations may be updated based on patient status, additional functional criteria and insurance authorization.  ? ?Follow Up Recommendations ? Home health OT  ?  ?Assistance Recommended at Discharge Intermittent Supervision/Assistance  ?Patient can return home with the following Assistance with cooking/housework;Assist for transportation ? ?  ?Functional Status Assessment ? Patient has had a recent decline in their functional status and demonstrates the ability to make significant improvements in function in a reasonable and predictable amount of time.  ?Equipment Recommendations ? Other (comment);None recommended by OT (pt has recommended equipment)  ?  ?Recommendations for Other Services   ? ? ?   ?Precautions / Restrictions Precautions ?Precautions: Fall ?Restrictions ?Weight Bearing Restrictions: No  ? ?  ? ?Mobility Bed Mobility ?Overal bed mobility: Needs Assistance ?Bed Mobility: Supine to Sit ?  ?  ?Supine to sit: HOB elevated, Supervision ?  ?  ?  ?  ? ?Transfers ?Overall transfer level: Needs assistance ?Equipment used: Rolling walker (2 wheels) ?Transfers: Sit to/from Stand ?Sit to Stand: Supervision ?  ?  ?  ?  ?  ?  ?  ? ?  ?Balance Overall balance assessment: Needs assistance ?Sitting-balance support: Feet supported ?Sitting balance-Leahy Scale: Good ?  ?  ?Standing balance support: During functional activity, No upper extremity supported ?Standing balance-Leahy Scale: Fair ?  ?  ?  ?  ?  ?  ?  ?  ?  ?  ?  ?  ?   ? ?ADL either performed or assessed with clinical judgement  ? ?ADL Overall ADL's : Needs assistance/impaired ?Eating/Feeding: Set up ?  ?Grooming: Wash/dry hands;Wash/dry face;Oral care;Standing;Supervision/safety ?Grooming Details (indicate cue type and reason): vcs for use of energy conservation techniques with desat to 80% on 3L [place] ?Upper Body Bathing: Supervision/ safety;Standing ?  ?Lower Body Bathing: Supervison/ safety;Sit to/from stand ?  ?  ?  ?Lower Body Dressing: Minimal assistance ?Lower Body Dressing Details (indicate cue type and reason): standing at sink level ?Toilet Transfer: Supervision/safety;Rolling walker (2 wheels);Ambulation ?  ?Toileting- Clothing Manipulation and Hygiene: Supervision/safety;Sit to/from stand ?  ?  ?  ?Functional mobility during ADLs: Supervision/safety;Rolling walker (2 wheels) (vcs for energy conservation techniques) ?General ADL Comments: pt with desat down to 80% spo2 on 3 L [place] after standing for approx 10 minutes. Recovered to >90% within 1 minute 3 L via [place].  ? ? ? ?Vision Patient Visual  Report: No change from baseline ?   ?   ?Perception   ?  ?Praxis   ?  ? ?Pertinent Vitals/Pain Pain Assessment ?Pain Assessment: 0-10 ?Pain Score: 5  ?Pain  Location: back and BLE ?Pain Descriptors / Indicators: Aching, Constant ?Pain Intervention(s): Limited activity within patient's tolerance, Repositioned, Monitored during session  ? ? ? ?Hand Dominance Right ?  ?Extremity/Trunk Assessment Upper Extremity Assessment ?Upper Extremity Assessment: Generalized weakness (4+/5 throughout BUE) ?  ?Lower Extremity Assessment ?Lower Extremity Assessment: Defer to PT evaluation ?  ?  ?  ?Communication Communication ?Communication: No difficulties ?  ?Cognition Arousal/Alertness: Awake/alert ?Behavior During Therapy: [facility] for tasks assessed/performed ?Overall Cognitive Status: Within Functional Limits for tasks assessed ?  ?  ?  ?  ?  ?  ?  ?  ?  ?  ?  ?  ?  ?  ?  ?  ?General Comments: alert and oriented x4, good safety awareness ?  ?  ?General Comments  prior to activity in supine: BP 110/58, HR 75, spo2 95% on 3L via [place]; during activity, BP 130/63, HR 73, spo2 95%, down to 80% with approx 10 minutes of standing, recovered in approx 1 minute to above 90% on 3 L via [place]. ? ?  ?Exercises Other Exercises ?Other Exercises: education re: role of OT, role of rehab, home safety, falls prevention, energy conservation techniques ?  ?Shoulder Instructions    ? ? ?Home Living Family/patient expects to be discharged to:: Private residence ?Living Arrangements: Children ?Available Help at Discharge: Family;Available PRN/intermittently ?Type of Home: House ?Home Access: Level entry ?  ?  ?Home Layout: Able to live on main level with bedroom/bathroom;One level;Laundry or work area in basement ?  ?  ?Bathroom Shower/Tub: Walk-in shower ?  ?Bathroom Toilet: Handicapped height ?Bathroom Accessibility: Yes ?  ?Home Equipment: Shower seat;Cane - single point;Grab bars - toilet;Grab bars - tub/shower;Rollator (4 wheels) ?  ?  ?  ? ?  ?Prior Functioning/Environment   ?  ?  ?  ?  ?  ?  ?Mobility Comments: amb with SPC ?ADLs Comments: MOD I- I with all ADL, assist with IADL; son does shopping, drives,  cleans; pt reports she cooks ?  ? ?  ?  ?OT Problem List: Decreased strength;Decreased activity tolerance ?  ?   ?OT Treatment/Interventions: Self-care/ADL training;Therapeutic exercise;Patient/family education;Energy conservation;DME and/or AE instruction;Therapeutic activities  ?  ?OT Goals(Current goals can be found in the care plan section) Acute Rehab OT Goals ?Patient Stated Goal: go home ?OT Goal Formulation: With patient ?Time For Goal Achievement: 11/20/21 ?Potential to Achieve Goals: Good ?ADL Goals ?Pt Will Perform Grooming: with modified independence;sitting;standing ?Pt Will Perform Upper Body Dressing: with modified independence ?Pt Will Perform Lower Body Dressing: with modified independence;sit to/from stand ?Pt Will Transfer to Toilet: with modified independence;ambulating ?Pt Will Perform Toileting - Clothing Manipulation and hygiene: sit to/from stand  ?OT Frequency: Min 2X/week ?  ? ?Co-evaluation   ?  ?  ?  ?  ? ?  ?AM-PAC OT "6 Clicks" Daily Activity     ?Outcome Measure Help from another person eating meals?: None ?Help from another person taking care of personal grooming?: None ?Help from another person toileting, which includes using toliet, bedpan, or urinal?: None ?Help from another person bathing (including washing, rinsing, drying)?: A Little ?Help from another person to put on and taking off regular upper body clothing?: None ?Help from another person to put on and taking off regular lower body clothing?:  None ?6 Click Score: 23 ?  ?End of Session Equipment Utilized During Treatment: Rolling walker (2 wheels);Gait belt ?Nurse Communication: Mobility status ? ?Activity Tolerance: Patient tolerated treatment well ?Patient left: in chair;with call bell/phone within reach;with chair alarm set ? ?OT Visit Diagnosis: Unsteadiness on feet (R26.81);Muscle weakness (generalized) (M62.81)  ?              ?Time: 4196-2229 ?OT Time Calculation (min): 48 min ?Charges:  OT General Charges ?$OT Visit:  1 Visit ?OT Evaluation ?$OT Eval Moderate Complexity: 1 Mod ?OT Treatments ?$Self Care/Home Management : 23-37 mins ? ?Shanon Payor, OTD OTR/L  ?11/06/21, 11:26 AM  ?

## 2021-11-06 NOTE — Progress Notes (Signed)
Pharmacy Antibiotic Note ? ?Kristin [name] is a 88 y.o. female admitted on 11/05/2021 with pneumonia.  Pharmacy has been consulted for meropenem dosing. ? ?Plan: ?Meropenem 1 gm IV Q12H ordered to start on 3/17 @ 0300.  ? ?Height: 5\' 7"  (170.2 cm) ?Weight: 77.2 kg (170 lb 4.8 oz) ?IBW/kg (Calculated) : 61.6 ? ?Temp (24hrs), Avg:99.9 ?F (37.7 ?C), Min:98.3 ?F (36.8 ?C), Max:104.4 ?F (40.2 ?C) ? ?Recent Labs  ?Lab 11/05/21 ?1214 11/05/21 ?1955 11/05/21 ?2229  ?WBC 7.1  --   --   ?CREATININE 1.67*  --   --   ?LATICACIDVEN 1.5 2.1* 1.7  ?  ?Estimated Creatinine Clearance: 24 mL/min (A) (by C-G formula based on SCr of 1.67 mg/dL (H)).   ? ?Allergies  ?Allergen Reactions  ? Flexeril [Cyclobenzaprine Hcl]   ?  Severe Rash  ? Vancomycin Rash  ?  Red Mans Syndrome  ? ? ?Antimicrobials this admission: ?  >>  ?  >>  ? ?Dose adjustments this admission: ? ? ?Microbiology results: ? BCx:  ? UCx:   ? Sputum:   ? MRSA PCR:  ? ?Thank you for allowing pharmacy to be a part of this patient?s care. ? ?Alphonzo Devera D ?11/06/2021 2:17 AM ? ?

## 2021-11-06 NOTE — Progress Notes (Signed)
PT Cancellation Note ? ?Patient Details ?Name: Kristin [name] ?MRN: [id number] ?DOB: [birth year] ? ? ?Cancelled Treatment:    Reason Eval/Treat Not Completed: Other (comment) PT orders received, chart reviewed. Attempted to see pt at 9:20AM but pt working with OT. Will f/u as able & as pt is available. ? ?Lavone Nian, PT, DPT ?11/06/21, 10:35 AM ? ? ?[name] ?11/06/2021, 10:34 AM ?

## 2021-11-06 NOTE — Assessment & Plan Note (Addendum)
-   Present on admission. Blood c/s growing E Coli in 1 of 4 bottles (anaerobic) - ID changed antibiotic to IV meropenem ?

## 2021-11-06 NOTE — Consult Note (Signed)
? ? ? ? ?Virtual Visit via Video Note ? ?I connected with Kristin [name] on @TODAY @ at  by a video enabled telemedicine application and verified that I am speaking with the correct person using two identifiers. ? ?Location: ?Patient: [facility] ?Provider: Home ?  ?I discussed the limitations of evaluation and management by telemedicine and the availability of in person appointments. The patient expressed understanding and agreed to proceed. ? ? ? ?Date of Admission:  11/05/2021    ?      ?Reason for Consult:  ESBL bacteremia   ?Referring Provider: Olivia Canter  MD ? ? ?Assessment: ? ?ESBL bacteremia with source unclear perhaps due to postobstructive pneumonia versus GI tract ?Right upper lobe mass suspicious for malignancy along with 2 pulmonary nodule superior segment right lower lobe ?Hyperdense 1 cm focus in the lower third of common bile duct possibly due to choledocholithiasis ?Generalized pancreatic dilatation ?Colonic diverticulosis ?Pulmonary fibrosis ?COPD ?CHF ?Paroxysmal atrial fibrillation ?Reported history of hepatitis C ? ?Plan: ? ?Continue meropenem ?Repeat blood cultures ?Provided she does well clinically she should only need 7 days of therapy though this may need to be intravenous antibiotics if there are no active oral agents ?I will check hepatitis C RNA genotype hepatitis B serologies,  ? ?Dr Gale Journey is available for questions this weekend and Dr. Baxter Flattery will cover [facility] on Monday and Tuesday likely using Telehealth but will defer to her. ? ?Active Problems: ?  Acute respiratory failure with hypoxia ([place]) ?  Paroxysmal atrial fibrillation (H[facility]) ?  Postobstructive pneumonia ?  Mass of upper lobe of right lung ?  Sepsis due to pneumonia [facility]) ?  Dyslipidemia ?  Bacteremia ? ? ?Scheduled Meds: ? apixaban  2.5 mg Oral BID  ? atorvastatin  20 mg Oral Daily  ? cholecalciferol  2,000 Units Oral Daily  ? furosemide  40 mg Oral Daily  ? gabapentin  300 mg Oral QHS  ? guaiFENesin  600 mg Oral BID  ?  ipratropium-albuterol  3 mL Nebulization TID  ? melatonin  10 mg Oral QHS  ? [START ON 11/07/2021] predniSONE  40 mg Oral Q breakfast  ? rOPINIRole  4 mg Oral QHS  ? ?Continuous Infusions: ? sodium chloride 20 mL/hr at 11/05/21 1314  ? sodium chloride 100 mL/hr at 11/06/21 1043  ? meropenem (MERREM) IV 1 g (11/06/21 1457)  ? ?PRN Meds:.acetaminophen **OR** acetaminophen, albuterol, bisacodyl, cetirizine, chlorpheniramine-HYDROcodone, diclofenac Sodium, magnesium hydroxide, metoCLOPramide (REGLAN) injection, oxyCODONE, traZODone ? ?HPI: Kristin [name] is a 88 y.o. female with past medical history significant for COPD CHF atrial fibrillation on Eliquis coronary artery disease hyperlipidemia reported history of hepatitis C who came to the ER with worsening of dyspnea chills fevers and syncopal event. ? ?She also had some nausea and vomiting after eating some chicken that she thought "did not agree with me". ? ?In the ER she was febrile to 104.4 with a blood pressure in the 90s over 50s.  She required oxygen via 2 L via nasal cannula ? ?Blood cultures were taken.  Chest x-ray showed cardiomegaly and chronic lung disease. ? ?CT chest abdomen pelvis showed a 4.5 cm posterior right upper lobe lung mass concerning for primary bronchogenic carcinoma.  There were 2 distinct pulmonary nodules in the superior segment right lower lobe concern for some postobstructive pneumonia. ? ?She had colonic diverticulosis as well as generalized pancreatic duct dilation and a possible gallstone in the common bile duct. ? ?Her blood cultures of something turned positive  for ESBL. ?  ?She has been placed on meropenem after having been given ceftriaxone and doxycycline initially. ? ?She seems improved compared to when she was admitted. ? ?She has been seen by pulmonary who are planning bronchoscopy with biopsies of the right upper lobe mass. ? ?From an infectious disease standpoint is not entirely clear the origin of the ESBL bacteremia.  If she  indeed has a bit of a postobstructive pneumonia and had aspirated material into this area previously it certainly could be from the lungs though not a typical organism to be found in pneumonia.  She does have common bile duct possible stone though not clear evidence of infectious pathology in the biliary tree. ? ?There is no focal abscess or deep site of infection. ? ?I think she should be capably treated with 7 days of antibiotics this may need to be with parenteral antibiotics if there are no viable oral options available. ? ?I spent 885 minutes with the patient including than 50% of the time in face to face counseling of the patient regarding the work-up for ESBL bacteremia her lung mass, personally reviewing CT chest abdomen pelvis performed yesterday March 14 along with review of medical records in preparation for the visit and during the visit and in coordination of her care. ? ? ?Review of Systems: ?Review of Systems  ?Constitutional:  Positive for fever. Negative for chills, malaise/fatigue and weight loss.  ?HENT:  Negative for congestion and sore throat.   ?Eyes:  Negative for blurred vision and photophobia.  ?Respiratory:  Positive for cough and shortness of breath. Negative for wheezing.   ?Cardiovascular:  Negative for chest pain, palpitations and leg swelling.  ?Gastrointestinal:  Negative for abdominal pain, blood in stool, constipation, diarrhea, heartburn, melena, nausea and vomiting.  ?Genitourinary:  Negative for dysuria, flank pain and hematuria.  ?Musculoskeletal:  Negative for back pain, falls, joint pain and myalgias.  ?Skin:  Negative for itching and rash.  ?Neurological:  Positive for dizziness, loss of consciousness and weakness. Negative for focal weakness and headaches.  ?Endo/Heme/Allergies:  Does not bruise/bleed easily.  ?Psychiatric/Behavioral:  Negative for depression and suicidal ideas. The patient does not have insomnia.   ? ?Past Medical History:  ?Diagnosis Date  ? Acute  postoperative pain 02/09/2018  ? Arrhythmia, sinus node 04/03/2014  ? Arthritis   ? Arthritis   ? Atrial fibrillation ([place])   ? Back pain   ? lower back chronic  ? Bradycardia   ? Breast cancer ([place]) 2004  ? right breast cancer  ? Cancer [facility]) 2004  ? rt breast cancer-post lumpectomy- chemo/rad  ? CHF (congestive heart failure) ([place])   ? Chronic back pain   ? CKD (chronic kidney disease)   ? COPD (chronic obstructive pulmonary disease) ([place])   ? wears O2 at 2L via [place] at night  ? COVID-19 06/11/2020  ? Cystocele   ? Decubitus ulcers   ? Dehydration   ? Gout   ? Hematuria   ? Hepatitis C   ? Hyperlipidemia   ? Hypertension   ? Hypomagnesemia 06/25/2015  ? NSTEMI (non-ST elevated myocardial infarction) ([place]) 04/08/2021  ? OAB (overactive bladder)   ? Overactive bladder   ? Overdose opiate, accidental or unintentional, sequela 10/02/2020  ? Restless leg   ? Shoulder pain, bilateral   ? Urinary frequency   ? UTI (lower urinary tract infection)   ? Vaginal atrophy   ? ? ?Social History  ? ?Tobacco Use  ? Smoking status: Former  ?  Packs/day: 1.25  ?  Years: 40.00  ?  Pack years: 50.00  ?  Types: Cigarettes  ?  Quit date: 12/16/1998  ?  Years since quitting: 22.9  ? Smokeless tobacco: Never  ?Vaping Use  ? Vaping Use: Never used  ?Substance Use Topics  ? Alcohol use: No  ?  Alcohol/week: 0.0 standard drinks  ? Drug use: Yes  ?  Types: Oxycodone  ? ? ?Family History  ?Problem Relation Age of Onset  ? Kidney disease Brother   ? Heart disease Father   ? Heart disease Mother   ? Stroke Mother   ? Cancer Sister   ? Breast cancer Sister 52  ? Breast cancer Paternal Aunt   ? Anesthesia problems Neg Hx   ? Hypotension Neg Hx   ? Malignant hyperthermia Neg Hx   ? Pseudochol deficiency Neg Hx   ? ?Allergies  ?Allergen Reactions  ? Flexeril [Cyclobenzaprine Hcl]   ?  Severe Rash  ? Vancomycin Rash  ?  Red [place] Syndrome  ? ? ?OBJECTIVE: ?Blood pressure (!) 112/92, pulse 98, temperature 97.8 ?F (36.6 ?C), resp. rate 19, height 5\' 7"  (1.702  m), weight 77.2 kg, SpO2 99 %. ? ?Physical Exam ?Constitutional:   ?   Appearance: She is overweight.  ?HENT:  ?   Head: Normocephalic and atraumatic.  ?   Right Ear: Hearing and external ear normal.  ?   Left Ear: Hearing

## 2021-11-06 NOTE — Assessment & Plan Note (Addendum)
Blood c/s growing E Coli in 1 of 4 bottles (anaerobic) - started meropenem per ID.  Will need repeat blood culture tomorrow ?

## 2021-11-06 NOTE — Consult Note (Signed)
? ? ? ?PULMONOLOGY ? ? ? ? ? ? ? ? ?Date: 11/06/2021,   ?MRN# [id number] Kristin [name] 1930/05/20 ? ? ?  ?AdmissionWeight: 77.2 kg                 ?CurrentWeight: 77.2 kg ? ? ?Referring physician: Dr Sidney Ace ? ? ?CHIEF COMPLAINT:  ? ?Lung mass and mediastinal lymphadenopathy ? ? ?HISTORY OF PRESENT ILLNESS  ? ?This is a pleasant 88 yr old F, she quit smoking 35 years ago per patient and is in good health not demented. We talked about her medical history which is listed belowe in [place] history.  She clearly states she wants everything done.  She has reviewed chest imaging with me and her wishes are to have full scope of therapy.  ? ?She has asked me to discuss these findings with her daughter [name] who is a Education officer, museum.  ? ?She does uses oxygen at home at 2L/min nasal canula and states this is chronic at least 2 years using mostly nocturnally and recently started using during daytime.  ? ?She is ambulatory and stable.  ? ? ?11/06/21- I was able to speak with daughter reviewed chest imaging and plan for biopsy.  We talked about patients age and the family will think about level of medical care but at this time wish to remain full code due to patients high functional status. ? ?PAST MEDICAL HISTORY  ? ?Past Medical History:  ?Diagnosis Date  ?? Acute postoperative pain 02/09/2018  ?? Arrhythmia, sinus node 04/03/2014  ?? Arthritis   ?? Arthritis   ?? Atrial fibrillation ([place])   ?? Back pain   ? lower back chronic  ?? Bradycardia   ?? Breast cancer ([place]) 2004  ? right breast cancer  ?? Cancer [facility]) 2004  ? rt breast cancer-post lumpectomy- chemo/rad  ?? CHF (congestive heart failure) ([place])   ?? Chronic back pain   ?? CKD (chronic kidney disease)   ?? COPD (chronic obstructive pulmonary disease) ([place])   ? wears O2 at 2L via [place] at night  ?? COVID-19 06/11/2020  ?? Cystocele   ?? Decubitus ulcers   ?? Dehydration   ?? Gout   ?? Hematuria   ?? Hepatitis C   ?? Hyperlipidemia   ?? Hypertension   ?? Hypomagnesemia 06/25/2015   ?? NSTEMI (non-ST elevated myocardial infarction) ([place]) 04/08/2021  ?? OAB (overactive bladder)   ?? Overactive bladder   ?? Overdose opiate, accidental or unintentional, sequela 10/02/2020  ?? Restless leg   ?? Shoulder pain, bilateral   ?? Urinary frequency   ?? UTI (lower urinary tract infection)   ?? Vaginal atrophy   ? ? ? ?SURGICAL HISTORY  ? ?Past Surgical History:  ?Procedure Laterality Date  ?? ABDOMINAL HYSTERECTOMY    ?? BACK SURGERY    ?? BREAST LUMPECTOMY Right 2004  ?? BREAST SURGERY    ? rt lumpectomy  ?? CARPAL TUNNEL RELEASE    ?? CATARACT EXTRACTION W/PHACO  10/19/2011  ? Procedure: CATARACT EXTRACTION PHACO AND INTRAOCULAR LENS PLACEMENT (IOC);  Surgeon: Elta Guadeloupe T. Gershon Crane, MD;  [facility]: [facility];  Service: Ophthalmology;  Laterality: Right;  CDE:10.81  ?? CATARACT EXTRACTION W/PHACO  11/02/2011  ? Procedure: CATARACT EXTRACTION PHACO AND INTRAOCULAR LENS PLACEMENT (IOC);  Surgeon: Elta Guadeloupe T. Gershon Crane, MD;  Location: [facility];  Service: Ophthalmology;  Laterality: Left;  CDE 8.60  ?? CHOLECYSTECTOMY    ? 3/18  ?? COLON SURGERY    ?? INSERT / REPLACE /  REMOVE PACEMAKER    ?? JOINT REPLACEMENT    ? bilateral TKA  ?? LOWER EXTREMITY ANGIOGRAPHY Left 10/14/2020  ? Procedure: LOWER EXTREMITY ANGIOGRAPHY;  Surgeon: Katha Cabal, MD;  Location: [place] [facility];  Service: Cardiovascular;  Laterality: Left;  ?? LOWER EXTREMITY ANGIOGRAPHY Right 09/08/2021  ? Procedure: LOWER EXTREMITY ANGIOGRAPHY;  Surgeon: Katha Cabal, MD;  Location: [place] [facility];  Service: Cardiovascular;  Laterality: Right;  ?? PACEMAKER INSERTION N/A 12/24/2015  ? Procedure: INSERTION PACEMAKER;  Surgeon: Isaias Cowman, MD;  Location: [facility] ORS;  Service: Cardiovascular;  Laterality: N/A;  ?? ROTATOR CUFF REPAIR    ? bilateral  ? ? ? ?FAMILY HISTORY  ? ?Family History  ?Problem Relation Age of Onset  ?? Kidney disease Brother   ?? Heart disease Father   ?? Heart disease Mother   ?? Stroke Mother   ?? Cancer Sister   ??  Breast cancer Sister 66  ?? Breast cancer Paternal Aunt   ?? Anesthesia problems Neg Hx   ?? Hypotension Neg Hx   ?? Malignant hyperthermia Neg Hx   ?? Pseudochol deficiency Neg Hx   ? ? ? ?SOCIAL HISTORY  ? ?Social History  ? ?Tobacco Use  ?? Smoking status: Former  ?  Packs/day: 1.25  ?  Years: 40.00  ?  Pack years: 50.00  ?  Types: Cigarettes  ?  Quit date: 12/16/1998  ?  Years since quitting: 22.9  ?? Smokeless tobacco: Never  ?Vaping Use  ?? Vaping Use: Never used  ?Substance Use Topics  ?? Alcohol use: No  ?  Alcohol/week: 0.0 standard drinks  ?? Drug use: Yes  ?  Types: Oxycodone  ? ? ? ?MEDICATIONS  ? ? ?Home Medication:  ? ?  ?Current Medication: ? ?Current Facility-Administered Medications:  ??  0.9 %  sodium chloride infusion, , Intravenous, Continuous, Menshew, Jenise V [place], PA-C, Last Rate: 20 mL/hr at 11/05/21 1314, New Bag at 11/05/21 1314 ??  0.9 %  sodium chloride infusion, , Intravenous, Continuous, Mansy, Jan A, MD, Last Rate: 100 mL/hr at 11/06/21 1043, New Bag at 11/06/21 1043 ??  acetaminophen (TYLENOL) tablet 650 mg, 650 mg, Oral, Q6H PRN, 650 mg at 11/06/21 1040 **OR** acetaminophen (TYLENOL) suppository 650 mg, 650 mg, Rectal, Q6H PRN, Mansy, Jan A, MD ??  albuterol (PROVENTIL) (2.5 MG/3ML) 0.083% nebulizer solution 2.5 mg, 2.5 mg, Nebulization, Q4H PRN, Max Sane, MD, 2.5 mg at 11/06/21 0756 ??  apixaban (ELIQUIS) tablet 2.5 mg, 2.5 mg, Oral, BID, Mansy, Jan A, MD, 2.5 mg at 11/06/21 1031 ??  atorvastatin (LIPITOR) tablet 20 mg, 20 mg, Oral, Daily, Mansy, Jan A, MD, 20 mg at 11/06/21 1030 ??  bisacodyl (DULCOLAX) EC tablet 10 mg, 10 mg, Oral, Daily PRN, Mansy, Arvella Merles, MD ??  cetirizine (ZYRTEC) tablet 5 mg, 5 mg, Oral, Daily PRN, Mansy, Arvella Merles, MD ??  chlorpheniramine-HYDROcodone 10-8 MG/5ML suspension 5 mL, 5 mL, Oral, Q12H PRN, Mansy, Arvella Merles, MD ??  cholecalciferol (VITAMIN D3) tablet 2,000 Units, 2,000 Units, Oral, Daily, Mansy, Jan A, MD, 2,000 Units at 11/06/21 1030 ??  diclofenac  Sodium (VOLTAREN) 1 % topical gel 4 g, 4 g, Topical, BID PRN, Mansy, Jan A, MD, 4 g at 11/06/21 0247 ??  furosemide (LASIX) tablet 40 mg, 40 mg, Oral, Daily, Mansy, Jan A, MD, 40 mg at 11/06/21 1031 ??  gabapentin (NEURONTIN) capsule 300 mg, 300 mg, Oral, QHS, Mansy, Jan A, MD, 300 mg at 11/05/21 2123 ??  guaiFENesin (MUCINEX) 12 hr tablet 600 mg, 600 mg, Oral, BID, Mansy, Jan A, MD, 600 mg at 11/06/21 1030 ??  ipratropium-albuterol (DUONEB) 0.5-2.5 (3) MG/3ML nebulizer solution 3 mL, 3 mL, Nebulization, TID, Max Sane, MD ??  magnesium hydroxide (MILK OF MAGNESIA) suspension 30 mL, 30 mL, Oral, Daily PRN, Mansy, Arvella Merles, MD ??  melatonin tablet 10 mg, 10 mg, Oral, QHS, Mansy, Jan A, MD, 10 mg at 11/05/21 2222 ??  meropenem (MERREM) 1 g in sodium chloride 0.9 % 100 mL IVPB, 1 g, Intravenous, Q12H, Sharion Settler, NP, Last Rate: 200 mL/hr at 11/06/21 0246, 1 g at 11/06/21 0246 ??  metoCLOPramide (REGLAN) injection 10 mg, 10 mg, Intravenous, Q6H PRN, Mansy, Arvella Merles, MD ??  oxyCODONE (Oxy IR/ROXICODONE) immediate release tablet 5 mg, 5 mg, Oral, Q4H PRN, Mansy, Jan A, MD, 5 mg at 11/06/21 1040 ??  [COMPLETED] methylPREDNISolone sodium succinate (SOLU-MEDROL) 125 mg/2 mL injection 80 mg, 80 mg, Intravenous, Q24H, 80 mg at 11/06/21 9509 **FOLLOWED BY** [START ON 11/07/2021] predniSONE (DELTASONE) tablet 40 mg, 40 mg, Oral, Q breakfast, Mansy, Arvella Merles, MD ??  rOPINIRole (REQUIP) tablet 4 mg, 4 mg, Oral, QHS, Mansy, Jan A, MD, 4 mg at 11/05/21 2222 ??  traZODone (DESYREL) tablet 25 mg, 25 mg, Oral, QHS PRN, Mansy, Arvella Merles, MD ? ? ? ?ALLERGIES  ? ?Flexeril [cyclobenzaprine hcl] and Vancomycin ? ? ? ? ?REVIEW OF SYSTEMS  ? ? ?Review of Systems: ? ?Gen:  Denies  fever, sweats, chills weigh loss  ?HEENT: Denies blurred vision, double vision, ear pain, eye pain, hearing loss, nose bleeds, sore throat ?Cardiac:  No dizziness, chest pain or heaviness, chest tightness,edema ?Resp:   Denies cough or sputum porduction, shortness of  breath,wheezing, hemoptysis,  ?Gi: Denies swallowing difficulty, stomach pain, nausea or vomiting, diarrhea, constipation, bowel incontinence ?Gu:  Denies bladder incontinence, burning urine ?Ext:   Denies Joint

## 2021-11-06 NOTE — Hospital Course (Addendum)
88 y.o. female with medical history significant for CHF, COPD, hepatitis C, dyslipidemia, coronary artery disease, hypertension, dyslipidemia and paroxysmal atrial fibrillation on Eliquis, who presented to the ER on 3/16 with acute onset of dyspnea and hypoxemia at her PCPs office with pulse ox of 82% on room air and patient found to have right upper lobe mass concerning for malignancy.  Also found to have severe sepsis felt to be from pneumonia as well as acute diastolic heart failure.  Oncology and pulmonary consulted.  Blood cultures noteworthy for ESBL E. coli and infectious disease consulted and patient started on meropenem, with patient needing 14 days of IV antibiotics.  Pulmonary took patient for bronchoscopy which preliminary appeared to be small cell lung carcinoma.  Samples taken and pathology pending.  Plan will be for patient to be discharged with midline and IV ertapenem daily x7 more days for total of 14 days of therapy ?

## 2021-11-06 NOTE — Progress Notes (Addendum)
?  Progress Note ? ? ?Patient: Kristin [name] TGP:[id number] DOB: 25-Apr-1930 DOA: 11/05/2021     1 ?DOS: the patient was seen and examined on 11/06/2021 ?  ?Brief hospital course: ?88 y.o. female with medical history significant for CHF, COPD, hepatitis C, dyslipidemia, coronary artery disease, hypertension, dyslipidemia and paroxysmal atrial fibrillation on Eliquis, who presented to the ER with acute onset of dyspnea and hypoxemia at her PCPs office with pulse ox of 82% on room air ? ?3/17: Oncology and pulmonary consult for right upper lobe mass concerning for malignancy ? ? ?Assessment and Plan: ?Postobstructive pneumonia ?-Continue IV Rocephin and Zithromax. ?- Pulmonary consult ? ?Mass of upper lobe of right lung ?- Appreciate pulmonary and oncology input.  Further work-up per them ? ?Sepsis due to pneumonia [facility]) ?- Present on admission.  Improving with treatment of pneumonia with IV Rocephin and Zithromax. ?Blood c/s growing E Coli in 1 of 4 bottles (anaerobic) - ID c/s ? ?Acute respiratory failure with hypoxia ([place]) ?Currently requiring 3 L oxygen to maintain oxygen saturation above 90% ? ?Bacteremia ?Blood c/s growing E Coli in 1 of 4 bottles (anaerobic) - started meropenem last night. ?ID c/s ? ?Dyslipidemia ?- continue statin therapy ? ?Paroxysmal atrial fibrillation ([facility]) ?-  continue Eliquis for now ? ? ? ? ?  ? ?Subjective: Patient reporting some lower extremity pain.  Reports feeling passing out at her PCP office although her son shared that she was just very lethargic ? ?Physical Exam: ?Vitals:  ? 11/06/21 0041 11/06/21 0505 11/06/21 0746 11/06/21 0757  ?BP: 105/66 111/60 128/81   ?Pulse: 68 60 65 69  ?Resp: 18 18 19 18   ?Temp: 98.3 ?F (36.8 ?C) 97.8 ?F (36.6 ?C) 98 ?F (36.7 ?C)   ?TempSrc: Oral Oral    ?SpO2: 92% (!) 88% 100% 96%  ?Weight:      ?Height:      ? ?88 year old female lying in the bed comfortably without any acute distress ?Eyes pupil equal round reactive to light and accommodation, no scleral  icterus.  Extraocular muscles intact ?Lungs decreased breath sounds at the bases ?Cardiovascular S1-S2 normal, no murmur rales or gallop ?Abdomen soft, benign ?Neuro awake, nonfocal ?Skin no rash or lesion ?Psych normal mood and affect ? ? ?Data Reviewed: ?  ?Hemoglobin 10.5, platelets 126, creatinine 1.20 ? ?Family Communication: Updated son over phone ? ?Disposition: ?Status is: Inpatient ?Remains inpatient appropriate because: Getting right upper lobe lung mass work-up and palliative care consult pending for goals of care discussion-overall poor prognosis ? ? Planned Discharge Destination: Home with Home Health ? ? ? DVT prophylaxis-Eliquis ? ?Time spent: 35 minutes ? ?Author: ?Max Sane, MD ?11/06/2021 10:49 AM ? ?For on call review [email or web address].  ?

## 2021-11-06 NOTE — Consult Note (Signed)
[place] ?CONSULT NOTE ? ?Patient Care Team: ?Olin Hauser, DO as PCP - General (Family Medicine) ?Olin Hauser, DO as Consulting Physician (Family Medicine) ?Isaias Cowman, MD as Consulting Physician (Cardiology) ?Milinda Pointer, MD as Referring Physician (Pain Medicine) ?Alisa Graff, FNP as Nurse Practitioner (Family Medicine) ?Delles, Virl Diamond, RPH-CPP as Pharmacist ?Florance, Tomasa Blase, RN as [place] Management ? ?CHIEF COMPLAINTS/PURPOSE OF CONSULTATION: Lung mass ? ?HISTORY OF PRESENTING ILLNESS:  ?Kristin [name] 88 y.o.  female CHF COPD-Home O2; CAD hypertension paroxysmal A-fib on Eliquis; remote history of breast cancer-currently admitted to hospital after noted to have shortness of breath and hypoxemia at PCP office. ? ?Of note also noted to have fevers and generalized weakness and chills.  Denies any nausea vomiting or abdominal pain. ? ?Remote history of breast cancer more than 20 years ago treated with surgery; poor candidate for chemotherapy-discontinued because of infection/sepsis.  S/p radiation. ? ?Review of Systems  ?Constitutional:  Positive for fever, malaise/fatigue and weight loss. Negative for chills and diaphoresis.  ?HENT:  Negative for nosebleeds and sore throat.   ?Eyes:  Negative for double vision.  ?Respiratory:  Positive for cough and shortness of breath. Negative for hemoptysis, sputum production and wheezing.   ?Cardiovascular:  Negative for chest pain, palpitations, orthopnea and leg swelling.  ?Gastrointestinal:  Negative for abdominal pain, blood in stool, constipation, diarrhea, heartburn, melena, nausea and vomiting.  ?Genitourinary:  Negative for dysuria, frequency and urgency.  ?Musculoskeletal:  Positive for back pain, joint pain and myalgias.  ?Skin: Negative.  Negative for itching and rash.  ?Neurological:  Positive for weakness. Negative for dizziness, tingling, focal weakness and  headaches.  ?Endo/Heme/Allergies:  Does not bruise/bleed easily.  ?Psychiatric/Behavioral:  Negative for depression. The patient is not nervous/anxious and does not have insomnia.    ? ?MEDICAL HISTORY:  ?Past Medical History:  ?Diagnosis Date  ? Acute postoperative pain 02/09/2018  ? Arrhythmia, sinus node 04/03/2014  ? Arthritis   ? Arthritis   ? Atrial fibrillation ([place])   ? Back pain   ? lower back chronic  ? Bradycardia   ? Breast cancer ([place]) 2004  ? right breast cancer  ? Cancer [facility]) 2004  ? rt breast cancer-post lumpectomy- chemo/rad  ? CHF (congestive heart failure) ([place])   ? Chronic back pain   ? CKD (chronic kidney disease)   ? COPD (chronic obstructive pulmonary disease) ([place])   ? wears O2 at 2L via [place] at night  ? COVID-19 06/11/2020  ? Cystocele   ? Decubitus ulcers   ? Dehydration   ? Gout   ? Hematuria   ? Hepatitis C   ? Hyperlipidemia   ? Hypertension   ? Hypomagnesemia 06/25/2015  ? NSTEMI (non-ST elevated myocardial infarction) ([place]) 04/08/2021  ? OAB (overactive bladder)   ? Overactive bladder   ? Overdose opiate, accidental or unintentional, sequela 10/02/2020  ? Restless leg   ? Shoulder pain, bilateral   ? Urinary frequency   ? UTI (lower urinary tract infection)   ? Vaginal atrophy   ? ? ?SURGICAL HISTORY: ?Past Surgical History:  ?Procedure Laterality Date  ? ABDOMINAL HYSTERECTOMY    ? BACK SURGERY    ? BREAST LUMPECTOMY Right 2004  ? BREAST SURGERY    ? rt lumpectomy  ? CARPAL TUNNEL RELEASE    ? CATARACT EXTRACTION W/PHACO  10/19/2011  ? Procedure: CATARACT EXTRACTION PHACO AND INTRAOCULAR LENS PLACEMENT (IOC);  Surgeon: Elta Guadeloupe T. Gershon Crane, MD;  Location: [facility];  Service: Ophthalmology;  Laterality: Right;  CDE:10.81  ? CATARACT EXTRACTION W/PHACO  11/02/2011  ? Procedure: CATARACT EXTRACTION PHACO AND INTRAOCULAR LENS PLACEMENT (IOC);  Surgeon: Elta Guadeloupe T. Gershon Crane, MD;  Location: [facility];  Service: Ophthalmology;  Laterality: Left;  CDE 8.60  ? CHOLECYSTECTOMY    ? 3/18  ? COLON SURGERY    ? INSERT /  REPLACE / REMOVE PACEMAKER    ? JOINT REPLACEMENT    ? bilateral TKA  ? LOWER EXTREMITY ANGIOGRAPHY Left 10/14/2020  ? Procedure: LOWER EXTREMITY ANGIOGRAPHY;  Surgeon: Katha Cabal, MD;  Location: [place] [facility];  Service: Cardiovascular;  Laterality: Left;  ? LOWER EXTREMITY ANGIOGRAPHY Right 09/08/2021  ? Procedure: LOWER EXTREMITY ANGIOGRAPHY;  Surgeon: Katha Cabal, MD;  Location: [place] [facility];  Service: Cardiovascular;  Laterality: Right;  ? PACEMAKER INSERTION N/A 12/24/2015  ? Procedure: INSERTION PACEMAKER;  Surgeon: Isaias Cowman, MD;  Location: [facility] ORS;  Service: Cardiovascular;  Laterality: N/A;  ? ROTATOR CUFF REPAIR    ? bilateral  ? ? ?SOCIAL HISTORY: ?Social History  ? ?Socioeconomic History  ? Marital status: Single  ?  Spouse name: Not on file  ? Number of children: 2  ? Years of education: 75  ? Highest education level: 12th grade  ?Occupational History  ? Occupation: retired  ?Tobacco Use  ? Smoking status: Former  ?  Packs/day: 1.25  ?  Years: 40.00  ?  Pack years: 50.00  ?  Types: Cigarettes  ?  Quit date: 12/16/1998  ?  Years since quitting: 22.9  ? Smokeless tobacco: Never  ?Vaping Use  ? Vaping Use: Never used  ?Substance and Sexual Activity  ? Alcohol use: No  ?  Alcohol/week: 0.0 standard drinks  ? Drug use: Yes  ?  Types: Oxycodone  ? Sexual activity: Not Currently  ?  Birth control/protection: Surgical  ?Other Topics Concern  ? Not on file  ?Social History Narrative  ? Not on file  ? ?Social Determinants of Health  ? ?Financial Resource Strain: Not on file  ?Food Insecurity: No Food Insecurity  ? Worried About Charity fundraiser in the Last Year: Never true  ? Ran Out of Food in the Last Year: Never true  ?Transportation Needs: No Transportation Needs  ? Lack of Transportation (Medical): No  ? Lack of Transportation (Non-Medical): No  ?Physical Activity: Not on file  ?Stress: Not on file  ?Social Connections: Not on file  ?Intimate Partner Violence: Not on  file  ? ? ?FAMILY HISTORY: ?Family History  ?Problem Relation Age of Onset  ? Kidney disease Brother   ? Heart disease Father   ? Heart disease Mother   ? Stroke Mother   ? Cancer Sister   ? Breast cancer Sister 39  ? Breast cancer Paternal Aunt   ? Anesthesia problems Neg Hx   ? Hypotension Neg Hx   ? Malignant hyperthermia Neg Hx   ? Pseudochol deficiency Neg Hx   ? ? ?ALLERGIES:  is allergic to flexeril [cyclobenzaprine hcl] and vancomycin. ? ?MEDICATIONS:  ?Current Facility-Administered Medications  ?Medication Dose Route Frequency Provider Last Rate Last Admin  ? 0.9 %  sodium chloride infusion   Intravenous Continuous Menshew, Dannielle Karvonen, PA-C 20 mL/hr at 11/05/21 1314 New Bag at 11/05/21 1314  ? 0.9 %  sodium chloride infusion   Intravenous Continuous Mansy, Jan A, MD 100 mL/hr at 11/06/21 2049 New Bag at 11/06/21 2049  ?  acetaminophen (TYLENOL) tablet 650 mg  650 mg Oral Q6H PRN Mansy, Jan A, MD   650 mg at 11/06/21 1040  ? Or  ? acetaminophen (TYLENOL) suppository 650 mg  650 mg Rectal Q6H PRN Mansy, Jan A, MD      ? albuterol (PROVENTIL) (2.5 MG/3ML) 0.083% nebulizer solution 2.5 mg  2.5 mg Nebulization Q4H PRN Max Sane, MD   2.5 mg at 11/06/21 1320  ? apixaban (ELIQUIS) tablet 2.5 mg  2.5 mg Oral BID Mansy, Jan A, MD   2.5 mg at 11/06/21 2043  ? atorvastatin (LIPITOR) tablet 20 mg  20 mg Oral Daily Mansy, Jan A, MD   20 mg at 11/06/21 1030  ? bisacodyl (DULCOLAX) EC tablet 10 mg  10 mg Oral Daily PRN Mansy, Jan A, MD      ? cetirizine (ZYRTEC) tablet 5 mg  5 mg Oral Daily PRN Mansy, Jan A, MD      ? chlorpheniramine-HYDROcodone 10-8 MG/5ML suspension 5 mL  5 mL Oral Q12H PRN Mansy, Jan A, MD   5 mL at 11/06/21 2040  ? cholecalciferol (VITAMIN D3) tablet 2,000 Units  2,000 Units Oral Daily Mansy, Jan A, MD   2,000 Units at 11/06/21 1030  ? diclofenac Sodium (VOLTAREN) 1 % topical gel 4 g  4 g Topical BID PRN Mansy, Jan A, MD   4 g at 11/06/21 2242  ? furosemide (LASIX) tablet 40 mg  40 mg Oral Daily  Mansy, Jan A, MD   40 mg at 11/06/21 1031  ? gabapentin (NEURONTIN) capsule 300 mg  300 mg Oral QHS Mansy, Jan A, MD   300 mg at 11/06/21 2043  ? guaiFENesin (MUCINEX) 12 hr tablet 600 mg  600 mg Oral BID M

## 2021-11-06 NOTE — Consult Note (Signed)
? ?                                                                                ?Consultation Note ?Date: 11/06/2021  ? ?Patient Name: Kristin [name]  ?DOB: 30-Oct-1929  MRN: [id number]  Age / Sex: 88 y.o., female  ?PCP: Olin Hauser, DO ?Referring Physician: Max Sane, MD ? ?Reason for Consultation: Establishing goals of care ? ?HPI/Patient Profile: 88 y.o. female  with past medical history of CHF, COPD (2 L nasal cannula nightly at home), hepatitis C, dyslipidemia, CAD, breast cancer, HTN, paroxysmal A-fib (Eliquis), and former smoker admitted on 11/05/2021 with acute dyspnea and hypoxia with fever and chills.  CT reveals right upper lobe mass.  Patient is being treated for postobstructive pneumonia and ESBL E. coli bacteremia.  Oncology and pulmonology have been consulted for workup. ? ?Clinical Assessment and Goals of Care: ?I have reviewed medical records including EPIC notes, labs and imaging, assessed the patient and then met with patient and her niece Drue Dun at bedside to discuss diagnosis prognosis, [place], EOL wishes, disposition and options. ? ?I introduced Palliative Medicine as specialized medical care for people living with serious illness. It focuses on providing relief from the symptoms and stress of a serious illness. The goal is to improve quality of life for both the patient and the family. ? ?We discussed a brief life review of the patient.  Patient works for [employer] for several years before retiring early at age 77.  Patient has one son [name] and a deceased daughter.  Her sister lives nearby and patient endorses being close with her Dianne Dun. 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  She also enjoyed baking having cooked 15 pounds cakes for family at Thanksgiving.  ? ?As far as functional and nutritional status pt endorses she has had to slow down recently. She had to give up baking and watching children since  she gets out of breath easily and has become increasingly forgetful/memory issues.  ? ?We discussed patient's current illness and what it means in the larger context of patient's on-going co-morbidities. ? ?I attempted to elicit values and goals of care important to the patient.  Patient says she wants to find out what is wrong with her, get rid of it, and keep on living.  I discussed quantity versus quality of life.  Patient shares that no matter what she wants to be kept alive as long as possible. ? ?I discussed in great detail the events that occur with a cardiopulmonary arrest, use of mechanical ventilation/tracheostomy/PEG tube Patient was in agreement with utilizing all offered and available medical interventions to preserve her life. ? ?When asked how long she would like to live on a ventilator she said indefinitely. I outlined that patient may not be able to return home with a trach and would likely need a long-term care nursing facility to manage her care. Patient said she would not want that, but if that what it takes to keep her alive she would do it. ? ?Discussed human mortality. Patient shared she wants to keep living no matter what. ? ?Advance directives and concepts specific to code  status were considered and discussed. Patient stated that in the even she is not able to speak for herself or loses capacity to make healthcare decisions, she would want her niece [name] to be her Media planner. Reviewed that [place] law would defer to patient's son [name] unless otherwise documented. Patient asked how soon she could document [name] as her Media planner. Explained that spiritual care will help complete this paperwork. Copy of HCPOA given to patient and spiritual care consult placed.  ? ?Patient is ready to have biopsy and asked when this procedure would be occurring. I shared that pulmonology will guiding the timing of this procedure.  ? ?Discussed with patient/family the importance of continued conversation  with family and the medical providers regarding overall plan of care and treatment options, ensuring decisions are within the context of the patient?s values and GOCs.   ? ?Palliative Care services outpatient were explained and offered. Pt was receptive and referral placed.  ? ?Goals are clear - full code/full scope. Pt wants any and all available and appropriate  interventions to keep her alive.  ? ?Questions and concerns were addressed. The family was encouraged to call with questions or concerns.  ? ?PMT will shadow patient's chart and intervene at family's request or if patient's health statues deteriorates. ? ?Primary Decision Maker ?PATIENT ? ?Code Status/Advance Care Planning: ?Full code ? ?Prognosis:   ?Unable to determine ? ?Discharge Planning: Home with Palliative Services ? ?Primary Diagnoses: ?Present on Admission: ? Bacteremia ? ? ?Physical Exam ?Vitals reviewed.  ?Constitutional:   ?   General: She is not in acute distress. ?   Appearance: She is normal weight. She is not toxic-appearing.  ?HENT:  ?   Head: Normocephalic.  ?Cardiovascular:  ?   Rate and Rhythm: Normal rate. Rhythm irregular.  ?Pulmonary:  ?   Effort: No accessory muscle usage.  ?   Comments: Moore in place, SOB with speaking a few words ?Musculoskeletal:     ?   General: Normal range of motion.  ?   Cervical back: Normal range of motion.  ?Skin: ?   General: Skin is warm and dry.  ?Neurological:  ?   Mental Status: She is alert and oriented to person, place, and time.  ?Psychiatric:     ?   Mood and Affect: Mood normal. Mood is not anxious.     ?   Behavior: Behavior normal. Behavior is not agitated.  ? ? ?Palliative Assessment/Data: 70% ? ? ? ? ?I discussed this patient's plan of care with patient, patient's niece Drue Dun, Dr. Manuella Ghazi, Dr. Lanney Gins. ? ?Thank you for this consult. Palliative medicine will continue to follow and assist holistically.  ? ?Time Total: 75 minutes ?Greater than 50%  of this time was spent counseling and  coordinating care related to the above assessment and plan. ? ?Signed by: ?Jordan Hawks, DNP, FNP-BC ?Palliative Medicine ? ?  ?Please contact Palliative Medicine Team phone at [phone] for questions and concerns.  ?For individual provider: See Amion ? ? ? ? ? ? ? ? ? ? ? ? ?  ?

## 2021-11-06 NOTE — TOC Initial Note (Signed)
Transition of Care (TOC) - Initial/Assessment Note  ? ? ?Patient Details  ?Name: Kristin [name] ?MRN: [id number] ?Date of Birth: May 28, 1930 ? ?Transition of Care (TOC) CM/SW Contact:    ?Pete Pelt, RN ?Phone Number: ?11/06/2021, 4:12 PM ? ?Clinical Narrative:    Patient lives with son, who can assist with care if/as needed.  Son works 2nd shift so is not home in the evenings.  Patient states Granddaughter lives next door and helps out when son is not home.   ? ?Patient has a cane and walker at home, and has just completed a course of therapy with [facility] home [facility].  Georgina Snell from [facility] notified that patient is being recommended for additional home health. ? ?Patient has no concerns about transportation to appointments, and is up to date with PCP and all appointments.  She has no concerns about medications, son can assist her to get them from the pharmacy and she uses a pill box. ? ?Patient denies other TOC needs at this time, but asks for [facility] to return, as she is expecting a continued medical workup and is concerned there might be additional.recommendations.             ? ? ?Expected Discharge Plan: [place] ?Barriers to Discharge: Continued Medical Work up ? ? ?Patient Goals and CMS Choice ?  ?  ?  ? ?Expected Discharge Plan and Services ?Expected Discharge Plan: [place] ?  ?Discharge Planning Services: CM Consult ?Post Acute Care Choice: Home Health ?Living arrangements for the past 2 months: [place] ?                ?  ?  ?  ?  ?  ?HH Arranged: PT, OT ?[place] Agency: [facility] ?Date HH Agency Contacted: 11/06/21 ?Time [place]: [id number] ?Representative spoke with at [facility]: Georgina Snell ? ?Prior Living Arrangements/Services ?Living arrangements for the past 2 months: [place] ?Lives with:: Self, Adult Children ?Patient language and need for interpreter reviewed:: Yes (No interpreter required) ?Do you feel safe going back to the place where  you live?: Yes      ?Need for Family Participation in Patient Care: Yes (Comment) ?Care giver support system in place?: Yes (comment) ?Current home services: DME (Walker and cane) ?Criminal Activity/Legal Involvement Pertinent to Current Situation/Hospitalization: No - Comment as needed ? ?Activities of Daily Living ?  ?  ? ?Permission Sought/Granted ?Permission sought to share information with : Case Manager ?Permission granted to share information with : Yes, Verbal Permission Granted ?   ? Permission granted to share info w AGENCY: [facility] ?   ?   ? ?Emotional Assessment ?Appearance:: Appears stated age ?Attitude/Demeanor/Rapport: Gracious, Engaged ?Affect (typically observed): Pleasant, Appropriate ?Orientation: : Oriented to Self, Oriented to Place, Oriented to  Time, Oriented to Situation ?Alcohol / Substance Use: Not Applicable ?Psych Involvement: No (comment) ? ?Admission diagnosis:  CAP (community acquired pneumonia) [J18.9] ?Patient Active Problem List  ? Diagnosis Date Noted  ? Bacteremia 11/06/2021  ? CAP (community acquired pneumonia) 11/05/2021  ? Postobstructive pneumonia 11/05/2021  ? Mass of upper lobe of right lung 11/05/2021  ? Sepsis due to pneumonia ([place]) 11/05/2021  ? Dyslipidemia 11/05/2021  ? COPD with acute exacerbation ([place]) 09/20/2021  ? Acute on chronic diastolic CHF (congestive heart failure) ([place]) 09/19/2021  ? Lymphedema 09/17/2021  ? Ischemic leg pain (Bilateral) 08/19/2021  ? Paroxysmal atrial fibrillation (HCC)   ?  History of atrial fibrillation 04/08/2021  ? PVD (peripheral vascular disease) ([place]) 04/08/2021  ? Low blood pressure reading 04/07/2021  ? Physical tolerance to opiate drug 01/28/2021  ? Chronic use of opiate for therapeutic purpose 11/24/2020  ? Atherosclerosis of native arteries of the extremities with ulceration ([place]) 11/03/2020  ? Uncomplicated opioid dependence ([place]) 10/20/2020  ? Encounter for chronic pain management 10/02/2020  ? Encounter for pain  management counseling 10/02/2020  ? Chronic kidney disease 10/02/2020  ? Chronic sacroiliac pain (Bilateral) 09/30/2020  ? Osteoarthritis of sacroiliac joints (Bilateral) ([place]) 09/30/2020  ? Chronic midline low back pain without sciatica 09/30/2020  ? Acute respiratory failure with hypoxia ([place]) 08/02/2020  ? Prolonged QT interval 08/02/2020  ? Venous insufficiency of both lower extremities 07/04/2020  ? Varicose veins of left lower extremity with inflammation, with ulcer of ankle limited to breakdown of skin ([place]) 04/01/2020  ? Opioid dependence with opioid-induced disorder ([place]) 02/14/2020  ? Abnormal MRI, lumbar spine (11/03/2019) 01/23/2020  ? Pharmacologic therapy 12/20/2019  ? Other specified dorsopathies, sacral and sacrococcygeal region 06/12/2019  ? Chronic hip pain (Left) 06/12/2019  ? Osteoarthritis of hip (Left) 06/12/2019  ? Greater trochanteric bursitis of hip (Left) 03/12/2019  ? Preop testing 01/22/2019  ? Cervicalgia 10/03/2018  ? Chronic shoulders pain (Bilateral) (L>R) 05/29/2018  ? Osteoarthritis of shoulder (Bilateral) 05/29/2018  ? Osteoarthritis of hip (Bilateral) 05/29/2018  ? Chronic shoulder pain after shoulder replacement (Bilateral) (L>R) 05/29/2018  ? Trigger finger, left middle finger 05/29/2018  ? Spondylosis without myelopathy or radiculopathy, lumbosacral region 02/09/2018  ? Insomnia 02/01/2018  ? Numbness and tingling of upper extremity (Bilateral) 09/07/2017  ? Chronic shoulder pain, Radicular (Bilateral) (L>R) 09/07/2017  ? Cervical facet syndrome (Bilateral) (L>R) 09/07/2017  ? Cervical (3-4 mm) Anterolisthesis of C4 on C5 09/07/2017  ? Cervical foraminal stenosis (Bilateral) 09/07/2017  ? Acute kidney injury superimposed on CKD ([place]) 08/14/2017  ? Chronic lower extremity pain (2ry area of Pain) (Bilateral) (L>R) 05/13/2017  ? DDD (degenerative disc disease), cervical 05/13/2017  ? Chronic cervical radicular pain (3ry area of Pain) (Bilateral) (L>R) 05/04/2017  ? Chronic  anticoagulation (Eliquis) 05/04/2017  ? Chronic hip pain (Bilateral) 04/20/2017  ? Chronic neck pain 03/01/2017  ? Hyperlipidemia 02/02/2017  ? Red blood cell antibody positive 11/18/2016  ? Anticoagulated 11/17/2016  ? Atrial fibrillation and flutter ([place]) 11/17/2016  ? Osteoarthritis involving multiple joints 06/14/2016  ? Cardiac pacemaker in situ 01/07/2016  ? S/P cardiac pacemaker procedure 01/07/2016  ? GERD (gastroesophageal reflux disease) 12/18/2015  ? Chronic pain syndrome 06/25/2015  ? Chronic low back pain (1ry area of Pain) (Bilateral) (L>R) w/ sciatica (Bilateral) 06/25/2015  ? Lumbar facet syndrome (Bilateral) (L>R) 06/25/2015  ? Lumbar spondylosis 06/25/2015  ? Chronic lumbar radicular pain (Bilateral) (L>R) 06/25/2015  ? Restless leg syndrome 06/25/2015  ? Myofascial pain 06/25/2015  ? Neurogenic pain 06/25/2015  ? Long term current use of opiate analgesic 06/25/2015  ? Long term prescription opiate use 06/25/2015  ? Opiate use (90 MME/Day) 06/25/2015  ? Opioid-induced constipation (OIC) 06/25/2015  ? Vitamin D insufficiency 06/25/2015  ? Grade 1 Retrolisthesis of L1 over L2 06/25/2015  ? Lumbar facet hypertrophy (L1-2, L2-3, and L4-5) 06/25/2015  ? Lumbar lateral recess stenosis (L1-2 and L4-5) 06/25/2015  ? 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) 06/25/2015  ? Epidural fibrosis 06/25/2015  ? Lumbar foraminal stenosis (Severe Left L4-5) 06/25/2015  ? Osteoporosis 06/25/2015  ? Chronic sacroiliac joint pain (Left) 06/25/2015  ? History of  methicillin resistant staphylococcus aureus (MRSA) 06/25/2015  ? Mitral valve prolapse 06/25/2015  ? Seasonal allergies 06/17/2015  ? History of breast cancer 06/17/2015  ? Chronic diastolic CHF (congestive heart failure) ([place]) 12/26/2014  ? Centrilobular emphysema ([place]) 09/16/2014  ? S/P cardiac catheterization 04/04/2014  ? DDD (degenerative disc disease), lumbosacral 04/03/2014  ? Benign hypertension with CKD (chronic kidney  disease) stage III ([place]) 04/03/2014  ? Lumbar central spinal stenosis 04/03/2014  ? Chronic kidney disease (CKD), stage III (moderate) ([place]) 10/30/2013  ? Gout 10/30/2013  ? ?PCP:  Olin Hauser, DO ?Pharmacy:

## 2021-11-07 DIAGNOSIS — R918 Other nonspecific abnormal finding of lung field: Secondary | ICD-10-CM | POA: Diagnosis not present

## 2021-11-07 DIAGNOSIS — A419 Sepsis, unspecified organism: Secondary | ICD-10-CM | POA: Diagnosis not present

## 2021-11-07 DIAGNOSIS — J9601 Acute respiratory failure with hypoxia: Secondary | ICD-10-CM | POA: Diagnosis not present

## 2021-11-07 DIAGNOSIS — J189 Pneumonia, unspecified organism: Secondary | ICD-10-CM | POA: Diagnosis not present

## 2021-11-07 LAB — HEPATITIS B SURFACE ANTIGEN: Hepatitis B Surface Ag: NONREACTIVE

## 2021-11-07 LAB — URINE CULTURE: Culture: NO GROWTH

## 2021-11-07 LAB — HEPATITIS A ANTIBODY, TOTAL: hep A Total Ab: REACTIVE — AB

## 2021-11-07 MED ORDER — GABAPENTIN 300 MG PO CAPS
300.0000 mg | ORAL_CAPSULE | Freq: Three times a day (TID) | ORAL | Status: DC
  Administered 2021-11-07 – 2021-11-12 (×14): 300 mg via ORAL
  Filled 2021-11-07 (×14): qty 1

## 2021-11-07 MED ORDER — GABAPENTIN 300 MG PO CAPS: 300.0000 mg | ORAL_CAPSULE | Freq: Three times a day (TID) | ORAL | Status: DC

## 2021-11-07 MED ORDER — GABAPENTIN 300 MG PO CAPS
300.0000 mg | ORAL_CAPSULE | Freq: Two times a day (BID) | ORAL | Status: DC
  Administered 2021-11-07: 300 mg via ORAL
  Filled 2021-11-07: qty 1

## 2021-11-07 MED ORDER — HEPARIN SODIUM (PORCINE) 5000 UNIT/ML IJ SOLN
5000.0000 [IU] | Freq: Three times a day (TID) | INTRAMUSCULAR | Status: DC
  Administered 2021-11-07 – 2021-11-08 (×2): 5000 [IU] via SUBCUTANEOUS
  Filled 2021-11-07 (×2): qty 1

## 2021-11-07 NOTE — Progress Notes (Addendum)
? ? ? ?PULMONOLOGY ? ? ? ? ? ? ? ? ?Date: 11/07/2021,   ?MRN# [id number] Kristin [name] January 30, 1930 ? ? ?  ?AdmissionWeight: 77.2 kg                 ?CurrentWeight: 77.2 kg ? ? ?Referring physician: Dr Sidney Ace ? ? ?CHIEF COMPLAINT:  ? ?Lung mass and mediastinal lymphadenopathy ? ? ?HISTORY OF PRESENT ILLNESS  ? ?This is a pleasant 88 yr old F, she quit smoking 35 years ago per patient and is in good health not demented. We talked about her medical history which is listed belowe in [place] history.  She clearly states she wants everything done.  She has reviewed chest imaging with me and her wishes are to have full scope of therapy.  ? ?She has asked me to discuss these findings with her daughter Kristin [name] who is a Education officer, museum.  ? ?She does uses oxygen at home at 2L/min nasal canula and states this is chronic at least 2 years using mostly nocturnally and recently started using during daytime.  ? ?She is ambulatory and stable.  ? ? ?11/07/21- patient is improved. She is using IS and taking tidal volume of 1357mL.  She is being treated for ESBL infection.  We are planning on lung biopsy Wed 12noon. Reviewed with patient.  She complains of burning pain in feet and lower extermities chronically and shares that legs move around at night with discomfort hindering ability to sleep. This sounds like neuropathy with restless leg syndrome.  Will increase neurontin to tid from bid. Reviewed medical plan with attending physician Dr Manuella Ghazi today.  ? ?PAST MEDICAL HISTORY  ? ?Past Medical History:  ?Diagnosis Date  ?? Acute postoperative pain 02/09/2018  ?? Arrhythmia, sinus node 04/03/2014  ?? Arthritis   ?? Arthritis   ?? Atrial fibrillation ([place])   ?? Back pain   ? lower back chronic  ?? Bradycardia   ?? Breast cancer ([place]) 2004  ? right breast cancer  ?? Cancer [facility]) 2004  ? rt breast cancer-post lumpectomy- chemo/rad  ?? CHF (congestive heart failure) ([place])   ?? Chronic back pain   ?? CKD (chronic kidney disease)   ?? COPD (chronic  obstructive pulmonary disease) ([place])   ? wears O2 at 2L via [place] at night  ?? COVID-19 06/11/2020  ?? Cystocele   ?? Decubitus ulcers   ?? Dehydration   ?? Gout   ?? Hematuria   ?? Hepatitis C   ?? Hyperlipidemia   ?? Hypertension   ?? Hypomagnesemia 06/25/2015  ?? NSTEMI (non-ST elevated myocardial infarction) ([place]) 04/08/2021  ?? OAB (overactive bladder)   ?? Overactive bladder   ?? Overdose opiate, accidental or unintentional, sequela 10/02/2020  ?? Restless leg   ?? Shoulder pain, bilateral   ?? Urinary frequency   ?? UTI (lower urinary tract infection)   ?? Vaginal atrophy   ? ? ? ?SURGICAL HISTORY  ? ?Past Surgical History:  ?Procedure Laterality Date  ?? ABDOMINAL HYSTERECTOMY    ?? BACK SURGERY    ?? BREAST LUMPECTOMY Right 2004  ?? BREAST SURGERY    ? rt lumpectomy  ?? CARPAL TUNNEL RELEASE    ?? CATARACT EXTRACTION W/PHACO  10/19/2011  ? Procedure: CATARACT EXTRACTION PHACO AND INTRAOCULAR LENS PLACEMENT ([facility]);  Surgeon: Elta Guadeloupe T. Gershon Crane, MD;  Location: [facility];  Service: Ophthalmology;  Laterality: Right;  CDE:10.81  ?? CATARACT EXTRACTION W/PHACO  11/02/2011  ? Procedure: CATARACT EXTRACTION PHACO AND INTRAOCULAR LENS PLACEMENT (  [place]);  Surgeon: Elta Guadeloupe T. Gershon Crane, MD;  Location: [facility];  Service: Ophthalmology;  Laterality: Left;  CDE 8.60  ?? CHOLECYSTECTOMY    ? 3/18  ?? COLON SURGERY    ?? INSERT / REPLACE / REMOVE PACEMAKER    ?? JOINT REPLACEMENT    ? bilateral TKA  ?? LOWER EXTREMITY ANGIOGRAPHY Left 10/14/2020  ? Procedure: LOWER EXTREMITY ANGIOGRAPHY;  Surgeon: Katha Cabal, MD;  Location: [facility];  Service: Cardiovascular;  Laterality: Left;  ?? LOWER EXTREMITY ANGIOGRAPHY Right 09/08/2021  ? Procedure: LOWER EXTREMITY ANGIOGRAPHY;  Surgeon: Katha Cabal, MD;  Location: [facility];  Service: Cardiovascular;  Laterality: Right;  ?? PACEMAKER INSERTION N/A 12/24/2015  ? Procedure: INSERTION PACEMAKER;  Surgeon: Isaias Cowman, MD;  Location: [facility];  Service:  Cardiovascular;  Laterality: N/A;  ?? ROTATOR CUFF REPAIR    ? bilateral  ? ? ? ?FAMILY HISTORY  ? ?Family History  ?Problem Relation Age of Onset  ?? Kidney disease Brother   ?? Heart disease Father   ?? Heart disease Mother   ?? Stroke Mother   ?? Cancer Sister   ?? Breast cancer Sister 44  ?? Breast cancer Paternal Aunt   ?? Anesthesia problems Neg Hx   ?? Hypotension Neg Hx   ?? Malignant hyperthermia Neg Hx   ?? Pseudochol deficiency Neg Hx   ? ? ? ?SOCIAL HISTORY  ? ?Social History  ? ?Tobacco Use  ?? Smoking status: Former  ?  Packs/day: 1.25  ?  Years: 40.00  ?  Pack years: 50.00  ?  Types: Cigarettes  ?  Quit date: 12/16/1998  ?  Years since quitting: 22.9  ?? Smokeless tobacco: Never  ?Vaping Use  ?? Vaping Use: Never used  ?Substance Use Topics  ?? Alcohol use: No  ?  Alcohol/week: 0.0 standard drinks  ?? Drug use: Yes  ?  Types: Oxycodone  ? ? ? ?MEDICATIONS  ? ? ?Home Medication:  ? ?  ?Current Medication: ? ?Current Facility-Administered Medications:  ??  0.9 %  sodium chloride infusion, , Intravenous, Continuous, Menshew, Jenise V Bacon, PA-C, Last Rate: 20 mL/hr at 11/05/21 1314, New Bag at 11/05/21 1314 ??  0.9 %  sodium chloride infusion, , Intravenous, Continuous, Mansy, Jan A, MD, Last Rate: 100 mL/hr at 11/07/21 0618, New Bag at 11/07/21 0618 ??  acetaminophen (TYLENOL) tablet 650 mg, 650 mg, Oral, Q6H PRN, 650 mg at 11/07/21 0952 **OR** acetaminophen (TYLENOL) suppository 650 mg, 650 mg, Rectal, Q6H PRN, Mansy, Jan A, MD ??  albuterol (PROVENTIL) (2.5 MG/3ML) 0.083% nebulizer solution 2.5 mg, 2.5 mg, Nebulization, Q4H PRN, Manuella Ghazi, [place], MD, 2.5 mg at 11/06/21 1320 ??  apixaban (ELIQUIS) tablet 2.5 mg, 2.5 mg, Oral, BID, Mansy, Jan A, MD, 2.5 mg at 11/07/21 0952 ??  atorvastatin (LIPITOR) tablet 20 mg, 20 mg, Oral, Daily, Mansy, Jan A, MD, 20 mg at 11/07/21 0952 ??  bisacodyl (DULCOLAX) EC tablet 10 mg, 10 mg, Oral, Daily PRN, Mansy, Arvella Merles, MD ??  cetirizine (ZYRTEC) tablet 5 mg, 5 mg, Oral, Daily  PRN, Mansy, Arvella Merles, MD ??  chlorpheniramine-HYDROcodone 10-8 MG/5ML suspension 5 mL, 5 mL, Oral, Q12H PRN, Mansy, Jan A, MD, 5 mL at 11/06/21 2040 ??  cholecalciferol (VITAMIN D3) tablet 2,000 Units, 2,000 Units, Oral, Daily, Mansy, Jan A, MD, 2,000 Units at 11/07/21 6222 ??  diclofenac Sodium (VOLTAREN) 1 % topical gel 4 g, 4 g, Topical, BID PRN, Mansy, Arvella Merles, MD, 4  g at 11/06/21 2242 ??  furosemide (LASIX) tablet 40 mg, 40 mg, Oral, Daily, Mansy, Jan A, MD, 40 mg at 11/07/21 7106 ??  gabapentin (NEURONTIN) capsule 300 mg, 300 mg, Oral, BID, Max Sane, MD, 300 mg at 11/07/21 0951 ??  guaiFENesin (MUCINEX) 12 hr tablet 600 mg, 600 mg, Oral, BID, Mansy, Jan A, MD, 600 mg at 11/07/21 2694 ??  ipratropium-albuterol (DUONEB) 0.5-2.5 (3) MG/3ML nebulizer solution 3 mL, 3 mL, Nebulization, TID, Max Sane, MD, 3 mL at 11/07/21 0714 ??  magnesium hydroxide (MILK OF MAGNESIA) suspension 30 mL, 30 mL, Oral, Daily PRN, Mansy, Arvella Merles, MD ??  melatonin tablet 10 mg, 10 mg, Oral, QHS, Mansy, Jan A, MD, 10 mg at 11/06/21 2042 ??  meropenem (MERREM) 1 g in sodium chloride 0.9 % 100 mL IVPB, 1 g, Intravenous, Q12H, Sharion Settler, NP, Last Rate: 200 mL/hr at 11/07/21 0237, 1 g at 11/07/21 0237 ??  metoCLOPramide (REGLAN) injection 10 mg, 10 mg, Intravenous, Q6H PRN, Mansy, Arvella Merles, MD ??  oxyCODONE (Oxy IR/ROXICODONE) immediate release tablet 5 mg, 5 mg, Oral, Q4H PRN, Mansy, Jan A, MD, 5 mg at 11/06/21 1040 ??  [COMPLETED] methylPREDNISolone sodium succinate (SOLU-MEDROL) 125 mg/2 mL injection 80 mg, 80 mg, Intravenous, Q24H, 80 mg at 11/06/21 8546 **FOLLOWED BY** predniSONE (DELTASONE) tablet 40 mg, 40 mg, Oral, Q breakfast, Mansy, Jan A, MD, 40 mg at 11/07/21 2703 ??  rOPINIRole (REQUIP) tablet 4 mg, 4 mg, Oral, QHS, Mansy, Jan A, MD, 4 mg at 11/06/21 2042 ??  traZODone (DESYREL) tablet 25 mg, 25 mg, Oral, QHS PRN, Mansy, Arvella Merles, MD ? ? ? ?ALLERGIES  ? ?Flexeril [cyclobenzaprine hcl] and Vancomycin ? ? ? ? ?REVIEW OF SYSTEMS   ? ? ?Review of Systems: ? ?Gen:  Denies  fever, sweats, chills weigh loss  ?HEENT: Denies blurred vision, double vision, ear pain, eye pain, hearing loss, nose bleeds, sore throat ?Cardiac:  No dizziness, chest pain

## 2021-11-07 NOTE — Progress Notes (Signed)
?  Progress Note ? ? ?Patient: Kristin [name] OTL:[id number] DOB: [birth year] DOA: 11/05/2021     2 ?DOS: the patient was seen and examined on 11/07/2021 ?  ?Brief hospital course: ?88 y.o. female with medical history significant for CHF, COPD, hepatitis C, dyslipidemia, coronary artery disease, hypertension, dyslipidemia and paroxysmal atrial fibrillation on Eliquis, who presented to the ER with acute onset of dyspnea and hypoxemia at her PCPs office with pulse ox of 82% on room air ? ?3/17: Oncology and pulmonary consult for right upper lobe mass concerning for malignancy ?3/18: Reports worsening neuropathy pain in the leg so increase the dose of gabapentin.  ID started meropenem for ESBL E. coli growing in the blood.  Pulmonary planning bronc on Wednesday 3/22 ? ? ?Assessment and Plan: ?Postobstructive pneumonia ?-Antibiotic changed to IV meropenem per ID due to ESBL E. coli growing in the blood ?- Pulmonary planning bronc on Wednesday 3/22 ? ?Mass of upper lobe of right lung ?- Appreciate pulmonary and oncology input.  Bronch planned for 3/22 at 10 AM per pulmonary.  Will need cardiac clearance ? ?Sepsis due to pneumonia [facility]) ?- Present on admission. Blood c/s growing E Coli in 1 of 4 bottles (anaerobic) - ID changed antibiotic to IV meropenem ? ?Acute respiratory failure with hypoxia ([place]) ?Weaned off to 2 L oxygen ? ?Bacteremia ?Blood c/s growing E Coli in 1 of 4 bottles (anaerobic) - started meropenem per ID.  Will need repeat blood culture tomorrow ? ?Dyslipidemia ?- continue statin therapy ? ?Paroxysmal atrial fibrillation (HCC) ?Eliquis on hold for planned bronc on Wednesday ? ? ? ? ?  ? ?Subjective: Agreeable to get bronchoscopy and biopsy.  Some cough and shortness of breath ? ?Physical Exam: ?Vitals:  ? 11/07/21 0239 11/07/21 0337 11/07/21 0837 11/07/21 1253  ?BP: 129/79 (!) 147/90 120/88 118/87  ?Pulse: 63 64 71 69  ?Resp: 19 16 16 17   ?Temp: 97.7 ?F (36.5 ?C) 98.2 ?F (36.8 ?C) 98.1 ?F (36.7 ?C) 98.2 ?F (36.8  ?C)  ?TempSrc: Oral Oral Oral Oral  ?SpO2: 97% 98% 94% 95%  ?Weight:      ?Height:      ?88 year old female lying in the bed comfortably without any acute distress ?Eyes pupil equal round reactive to light and accommodation, no scleral icterus.  Extraocular muscles intact ?Lungs decreased breath sounds at the bases ?Cardiovascular S1-S2 normal, no murmur rales or gallop ?Abdomen soft, benign ?Neuro awake, nonfocal ?Skin no rash or lesion ?Psych normal mood and affect ? ? ?Data Reviewed: ? ?There are no new results to review at this time. ? ?Family Communication: None ? ?Disposition: ?Status is: Inpatient ?Remains inpatient appropriate because: Getting bronc on 3/22 for lung mass work-up ? ? Planned Discharge Destination: Home ? ? ? DVT prophylaxis-subcu heparin ?Time spent: 35 minutes ? ?Author: Max Sane, MD ?11/07/2021 2:01 PM ? ?For on call review [email or web address]  ?

## 2021-11-07 NOTE — Assessment & Plan Note (Signed)
-   Appreciate pulmonary and oncology input.  Bronch planned for 3/22 at 10 AM per pulmonary.  Will need cardiac clearance ?

## 2021-11-07 NOTE — Assessment & Plan Note (Signed)
-   continue statin therapy. 

## 2021-11-07 NOTE — Assessment & Plan Note (Signed)
Weaned off to 2 L oxygen ?

## 2021-11-07 NOTE — Assessment & Plan Note (Addendum)
-  Antibiotic changed to IV meropenem per ID due to ESBL E. coli growing in the blood ?- Pulmonary planning bronc on Wednesday 3/22 ?

## 2021-11-07 NOTE — Assessment & Plan Note (Signed)
Eliquis on hold for planned bronc on Wednesday ?

## 2021-11-08 DIAGNOSIS — J9601 Acute respiratory failure with hypoxia: Secondary | ICD-10-CM | POA: Diagnosis not present

## 2021-11-08 DIAGNOSIS — R918 Other nonspecific abnormal finding of lung field: Secondary | ICD-10-CM | POA: Diagnosis not present

## 2021-11-08 DIAGNOSIS — J189 Pneumonia, unspecified organism: Secondary | ICD-10-CM | POA: Diagnosis not present

## 2021-11-08 DIAGNOSIS — A419 Sepsis, unspecified organism: Secondary | ICD-10-CM | POA: Diagnosis not present

## 2021-11-08 LAB — CULTURE, BLOOD (ROUTINE X 2)
Special Requests: ADEQUATE
Special Requests: ADEQUATE

## 2021-11-08 LAB — BASIC METABOLIC PANEL
Anion gap: 6 (ref 5–15)
BUN: 31 mg/dL — ABNORMAL HIGH (ref 8–23)
CO2: 21 mmol/L — ABNORMAL LOW (ref 22–32)
Calcium: 8.5 mg/dL — ABNORMAL LOW (ref 8.9–10.3)
Chloride: 110 mmol/L (ref 98–111)
Creatinine, Ser: 1.08 mg/dL — ABNORMAL HIGH (ref 0.44–1.00)
GFR, Estimated: 49 mL/min — ABNORMAL LOW (ref >60)
Glucose, Bld: 98 mg/dL (ref 70–99)
Potassium: 3.9 mmol/L (ref 3.5–5.1)
Sodium: 137 mmol/L (ref 135–145)

## 2021-11-08 LAB — CBC
HCT: 32.6 % — ABNORMAL LOW (ref 36.0–46.0)
Hemoglobin: 10 g/dL — ABNORMAL LOW (ref 12.0–15.0)
MCH: 27.7 pg (ref 26.0–34.0)
MCHC: 30.7 g/dL (ref 30.0–36.0)
MCV: 90.3 fL (ref 80.0–100.0)
Platelets: 148 10*3/uL — ABNORMAL LOW (ref 150–400)
RBC: 3.61 MIL/uL — ABNORMAL LOW (ref 3.87–5.11)
RDW: 15.8 % — ABNORMAL HIGH (ref 11.5–15.5)
WBC: 8.4 10*3/uL (ref 4.0–10.5)
nRBC: 0 % (ref 0.0–0.2)

## 2021-11-08 LAB — HCV RNA QUANT: HCV Quantitative: NOT DETECTED IU/mL (ref >50)

## 2021-11-08 MED ORDER — PREDNISONE 20 MG PO TABS
30.0000 mg | ORAL_TABLET | Freq: Every day | ORAL | Status: AC
  Administered 2021-11-09 – 2021-11-10 (×2): 30 mg via ORAL
  Filled 2021-11-08 (×2): qty 1

## 2021-11-08 MED ORDER — FUROSEMIDE 10 MG/ML IJ SOLN
40.0000 mg | Freq: Every day | INTRAMUSCULAR | Status: DC
  Administered 2021-11-08 – 2021-11-12 (×5): 40 mg via INTRAVENOUS
  Filled 2021-11-08 (×5): qty 4

## 2021-11-08 MED ORDER — HEPARIN SODIUM (PORCINE) 5000 UNIT/ML IJ SOLN
5000.0000 [IU] | Freq: Two times a day (BID) | INTRAMUSCULAR | Status: DC
  Administered 2021-11-08 – 2021-11-10 (×4): 5000 [IU] via SUBCUTANEOUS
  Filled 2021-11-08 (×5): qty 1

## 2021-11-08 NOTE — Progress Notes (Signed)
?  Progress Note ? ? ?Patient: Kristin [name] BMW:[id number] DOB: 02/07/1930 DOA: 11/05/2021     3 ?DOS: the patient was seen and examined on 11/08/2021 ?  ?Brief hospital course: ?88 y.o. female with medical history significant for CHF, COPD, hepatitis C, dyslipidemia, coronary artery disease, hypertension, dyslipidemia and paroxysmal atrial fibrillation on Eliquis, who presented to the ER with acute onset of dyspnea and hypoxemia at her PCPs office with pulse ox of 82% on room air ? ?3/17: Oncology and pulmonary consult for right upper lobe mass concerning for malignancy ?3/18: Reports worsening neuropathy pain in the leg so increase the dose of gabapentin.  ID started meropenem for ESBL E. coli growing in the blood.  Pulmonary planning bronc on Wednesday 3/22 ?3/19: Stopped IV fluid, repeating blood culture, restarting Lasix ? ? ?Assessment and Plan: ?Postobstructive pneumonia ?-Antibiotic changed to IV meropenem per ID due to ESBL E. coli growing in the blood.  Repeat blood culture today ?- Pulmonary planning bronc on Wednesday 3/22 ? ?Mass of upper lobe of right lung ?- Appreciate pulmonary and oncology input.  Bronch planned for 3/22 at 10 AM per pulmonary.  Will need cardiac clearance.  Dr. Nehemiah Massed consulted ? ?Sepsis due to pneumonia [facility]) ?Present on admission blood c/s growing E Coli in 1 of 4 bottles (anaerobic) -continue IV meropenem.  Repeat blood cultures today ? ?Acute respiratory failure with hypoxia ([place]) ?Weaned off to 2 L oxygen.  Starting Lasix 40 mg IV daily ? ?Bacteremia ?Blood c/s growing E Coli in 1 of 4 bottles (anaerobic) -continue meropenem per ID.  Repeat blood cultures today ? ?Dyslipidemia ?- continue statin therapy ? ?Paroxysmal atrial fibrillation (HCC) ?Eliquis on hold for planned bronc on Wednesday ? ? ? ? ?  ? ?Subjective: Lower extremity pain.  Getting somewhat swollen ? ?Physical Exam: ?Vitals:  ? 11/07/21 2012 11/07/21 2113 11/08/21 0442 11/08/21 0700  ?BP:  140/77 136/79 134/76   ?Pulse:  66 63 64  ?Resp:  20 16 18   ?Temp:  97.9 ?F (36.6 ?C) 97.7 ?F (36.5 ?C) 97.9 ?F (36.6 ?C)  ?TempSrc:  Oral Oral Oral  ?SpO2: 96% 97% 100% 98%  ?Weight:      ?Height:      ? ?88 year old female lying in the bed comfortably without any acute distress ?Eyes pupil equal round reactive to light and accommodation, no scleral icterus. ?Extraocular muscles intact ?Lungs decreased breath sounds at the bases ?Cardiovascular S1-S2 normal, no murmur rales or gallop.  Lower extremity swelling + ?Abdomen soft, benign ?Neuro awake, nonfocal ?Skin no rash or lesion ?Psych normal mood and affect ? ?Data Reviewed: ? ?There are no new results to review at this time. ? ?Family Communication: None ? ?Disposition: ?Status is: Inpatient ?Remains inpatient appropriate because: Bronc on coming Wednesday ? ? Planned Discharge Destination: Home with Home Health ? ? ? DVT prophylaxis-heparin subcu ?Time spent: 35 minutes ? ?Author: Max Sane, MD ?11/08/2021 11:25 AM ? ?For on call review [email or web address]  ?

## 2021-11-08 NOTE — Progress Notes (Signed)
? ? ? ?PULMONOLOGY ? ? ? ? ? ? ? ? ?Date: 11/08/2021,   ?MRN# [id number] Kristin [name] 10-02-1929 ? ? ?  ?AdmissionWeight: 77.2 kg                 ?CurrentWeight: 77.2 kg ? ? ?Referring physician: Dr Sidney Ace ? ? ?CHIEF COMPLAINT:  ? ?Lung mass and mediastinal lymphadenopathy ? ? ?HISTORY OF PRESENT ILLNESS  ? ?This is a pleasant 88 yr old F, she quit smoking 35 years ago per patient and is in good health not demented. We talked about her medical history which is listed belowe in [place] history.  She clearly states she wants everything done.  She has reviewed chest imaging with me and her wishes are to have full scope of therapy.  ? ?She has asked me to discuss these findings with her daughter [name] who is a Education officer, museum.  ? ?She does uses oxygen at home at 2L/min nasal canula and states this is chronic at least 2 years using mostly nocturnally and recently started using during daytime.  ? ?She is ambulatory and stable. ? ?11/08/21- patient seen and examined at bedside, no overnight events. Vitals stable on room air. CBC and BMP reviewed with mild thrombocytopenia and chronic anemia. No signs of bleeding clincally, she is on heparin [place] for DVT ppx. Renal function appears to be improving. Have NOT dcd lasix due to persitent 2+ pitting edema and crackles b/l on auscultation. Have reduced heparin to bid from tid. Have reduced prednisone from 40 to 30 po daily.Bronch for Wed 12pm. Suspect thrombocytopenia due to bacteremia and meropenem together and anticipate improvement over time.  May need to discuss with ID when able.  She felt Voltaren gel was very helpful for her legs and feet hurting overnight.  ? ?PAST MEDICAL HISTORY  ? ?Past Medical History:  ?Diagnosis Date  ?? Acute postoperative pain 02/09/2018  ?? Arrhythmia, sinus node 04/03/2014  ?? Arthritis   ?? Arthritis   ?? Atrial fibrillation ([place])   ?? Back pain   ? lower back chronic  ?? Bradycardia   ?? Breast cancer ([place]) 2004  ? right breast cancer  ?? Cancer  [facility]) 2004  ? rt breast cancer-post lumpectomy- chemo/rad  ?? CHF (congestive heart failure) ([place])   ?? Chronic back pain   ?? CKD (chronic kidney disease)   ?? COPD (chronic obstructive pulmonary disease) ([place])   ? wears O2 at 2L via [place] at night  ?? COVID-19 06/11/2020  ?? Cystocele   ?? Decubitus ulcers   ?? Dehydration   ?? Gout   ?? Hematuria   ?? Hepatitis C   ?? Hyperlipidemia   ?? Hypertension   ?? Hypomagnesemia 06/25/2015  ?? NSTEMI (non-ST elevated myocardial infarction) ([place]) 04/08/2021  ?? OAB (overactive bladder)   ?? Overactive bladder   ?? Overdose opiate, accidental or unintentional, sequela 10/02/2020  ?? Restless leg   ?? Shoulder pain, bilateral   ?? Urinary frequency   ?? UTI (lower urinary tract infection)   ?? Vaginal atrophy   ? ? ? ?SURGICAL HISTORY  ? ?Past Surgical History:  ?Procedure Laterality Date  ?? ABDOMINAL HYSTERECTOMY    ?? BACK SURGERY    ?? BREAST LUMPECTOMY Right 2004  ?? BREAST SURGERY    ? rt lumpectomy  ?? CARPAL TUNNEL RELEASE    ?? CATARACT EXTRACTION W/PHACO  10/19/2011  ? Procedure: CATARACT EXTRACTION PHACO AND INTRAOCULAR LENS PLACEMENT (IOC);  Surgeon: Elta Guadeloupe T. Gershon Crane, MD;  [facility];  Service: Ophthalmology;  Laterality: Right;  CDE:10.81  ?? CATARACT EXTRACTION W/PHACO  11/02/2011  ? Procedure: CATARACT EXTRACTION PHACO AND INTRAOCULAR LENS PLACEMENT (IOC);  Surgeon: Elta Guadeloupe T. Gershon Crane, MD;  Location: [facility];  Service: Ophthalmology;  Laterality: Left;  CDE 8.60  ?? CHOLECYSTECTOMY    ? 3/18  ?? COLON SURGERY    ?? INSERT / REPLACE / REMOVE PACEMAKER    ?? JOINT REPLACEMENT    ? bilateral TKA  ?? LOWER EXTREMITY ANGIOGRAPHY Left 10/14/2020  ? Procedure: LOWER EXTREMITY ANGIOGRAPHY;  Surgeon: Katha Cabal, MD;  Location: [place] [facility];  Service: Cardiovascular;  Laterality: Left;  ?? LOWER EXTREMITY ANGIOGRAPHY Right 09/08/2021  ? Procedure: LOWER EXTREMITY ANGIOGRAPHY;  Surgeon: Katha Cabal, MD;  Location: [place] [facility];  Service:  Cardiovascular;  Laterality: Right;  ?? PACEMAKER INSERTION N/A 12/24/2015  ? Procedure: INSERTION PACEMAKER;  Surgeon: Isaias Cowman, MD;  Location: [facility] ORS;  Service: Cardiovascular;  Laterality: N/A;  ?? ROTATOR CUFF REPAIR    ? bilateral  ? ? ? ?FAMILY HISTORY  ? ?Family History  ?Problem Relation Age of Onset  ?? Kidney disease Brother   ?? Heart disease Father   ?? Heart disease Mother   ?? Stroke Mother   ?? Cancer Sister   ?? Breast cancer Sister 23  ?? Breast cancer Paternal Aunt   ?? Anesthesia problems Neg Hx   ?? Hypotension Neg Hx   ?? Malignant hyperthermia Neg Hx   ?? Pseudochol deficiency Neg Hx   ? ? ? ?SOCIAL HISTORY  ? ?Social History  ? ?Tobacco Use  ?? Smoking status: Former  ?  Packs/day: 1.25  ?  Years: 40.00  ?  Pack years: 50.00  ?  Types: Cigarettes  ?  Quit date: 12/16/1998  ?  Years since quitting: 22.9  ?? Smokeless tobacco: Never  ?Vaping Use  ?? Vaping Use: Never used  ?Substance Use Topics  ?? Alcohol use: No  ?  Alcohol/week: 0.0 standard drinks  ?? Drug use: Yes  ?  Types: Oxycodone  ? ? ? ?MEDICATIONS  ? ? ?Home Medication:  ? ?  ?Current Medication: ? ?Current Facility-Administered Medications:  ??  0.9 %  sodium chloride infusion, , Intravenous, Continuous, Menshew, Jenise V Bacon, PA-C, Last Rate: 20 mL/hr at 11/05/21 1314, New Bag at 11/05/21 1314 ??  0.9 %  sodium chloride infusion, , Intravenous, Continuous, Mansy, Jan A, MD, Last Rate: 100 mL/hr at 11/08/21 0230, New Bag at 11/08/21 0230 ??  acetaminophen (TYLENOL) tablet 650 mg, 650 mg, Oral, Q6H PRN, 650 mg at 11/07/21 0952 **OR** acetaminophen (TYLENOL) suppository 650 mg, 650 mg, Rectal, Q6H PRN, Mansy, Jan A, MD ??  albuterol (PROVENTIL) (2.5 MG/3ML) 0.083% nebulizer solution 2.5 mg, 2.5 mg, Nebulization, Q4H PRN, Max Sane, MD, 2.5 mg at 11/06/21 1320 ??  atorvastatin (LIPITOR) tablet 20 mg, 20 mg, Oral, Daily, Mansy, Jan A, MD, 20 mg at 11/08/21 0850 ??  bisacodyl (DULCOLAX) EC tablet 10 mg, 10 mg, Oral, Daily  PRN, Mansy, Arvella Merles, MD ??  cetirizine (ZYRTEC) tablet 5 mg, 5 mg, Oral, Daily PRN, Mansy, Arvella Merles, MD ??  chlorpheniramine-HYDROcodone 10-8 MG/5ML suspension 5 mL, 5 mL, Oral, Q12H PRN, Mansy, Jan A, MD, 5 mL at 11/07/21 2038 ??  cholecalciferol (VITAMIN D3) tablet 2,000 Units, 2,000 Units, Oral, Daily, Mansy, Jan A, MD, 2,000 Units at 11/08/21 0851 ??  diclofenac Sodium (VOLTAREN) 1 % topical gel 4 g, 4 g,  Topical, BID PRN, Mansy, Jan A, MD, 4 g at 11/07/21 2045 ??  furosemide (LASIX) tablet 40 mg, 40 mg, Oral, Daily, Mansy, Jan A, MD, 40 mg at 11/08/21 0850 ??  gabapentin (NEURONTIN) capsule 300 mg, 300 mg, Oral, TID, Lanney Gins, Tereka Thorley, MD, 300 mg at 11/08/21 0850 ??  guaiFENesin (MUCINEX) 12 hr tablet 600 mg, 600 mg, Oral, BID, Mansy, Jan A, MD, 600 mg at 11/08/21 0850 ??  heparin injection 5,000 Units, 5,000 Units, Subcutaneous, Q8H, Ottie Glazier, MD, 5,000 Units at 11/08/21 0522 ??  ipratropium-albuterol (DUONEB) 0.5-2.5 (3) MG/3ML nebulizer solution 3 mL, 3 mL, Nebulization, TID, Manuella Ghazi, Vipul, MD, 3 mL at 11/08/21 0730 ??  magnesium hydroxide (MILK OF MAGNESIA) suspension 30 mL, 30 mL, Oral, Daily PRN, Mansy, Arvella Merles, MD ??  melatonin tablet 10 mg, 10 mg, Oral, QHS, Mansy, Jan A, MD, 10 mg at 11/07/21 2039 ??  meropenem (MERREM) 1 g in sodium chloride 0.9 % 100 mL IVPB, 1 g, Intravenous, Q12H, Sharion Settler, NP, Last Rate: 200 mL/hr at 11/08/21 0228, 1 g at 11/08/21 0228 ??  metoCLOPramide (REGLAN) injection 10 mg, 10 mg, Intravenous, Q6H PRN, Mansy, Arvella Merles, MD ??  oxyCODONE (Oxy IR/ROXICODONE) immediate release tablet 5 mg, 5 mg, Oral, Q4H PRN, Mansy, Jan A, MD, 5 mg at 11/06/21 1040 ??  [COMPLETED] methylPREDNISolone sodium succinate (SOLU-MEDROL) 125 mg/2 mL injection 80 mg, 80 mg, Intravenous, Q24H, 80 mg at 11/06/21 2080 **FOLLOWED BY** predniSONE (DELTASONE) tablet 40 mg, 40 mg, Oral, Q breakfast, Mansy, Jan A, MD, 40 mg at 11/08/21 0850 ??  rOPINIRole (REQUIP) tablet 4 mg, 4 mg, Oral, QHS, Mansy, Jan A,  MD, 4 mg at 11/07/21 2040 ??  traZODone (DESYREL) tablet 25 mg, 25 mg, Oral, QHS PRN, Mansy, Arvella Merles, MD ? ? ? ?ALLERGIES  ? ?Flexeril [cyclobenzaprine hcl] and Vancomycin ? ? ? ? ?REVIEW OF SYSTEMS  ? ? ?Review of Sys

## 2021-11-08 NOTE — Assessment & Plan Note (Signed)
Weaned off to 2 L oxygen.  Starting Lasix 40 mg IV daily ?

## 2021-11-08 NOTE — Assessment & Plan Note (Addendum)
Present on admission blood c/s growing E Coli in 1 of 4 bottles (anaerobic) -continue IV meropenem.  Repeat blood cultures today ?

## 2021-11-08 NOTE — Assessment & Plan Note (Signed)
-   Appreciate pulmonary and oncology input.  Bronch planned for 3/22 at 10 AM per pulmonary.  Will need cardiac clearance.  Dr. Nehemiah Massed consulted ?

## 2021-11-08 NOTE — Assessment & Plan Note (Signed)
-  Antibiotic changed to IV meropenem per ID due to ESBL E. coli growing in the blood.  Repeat blood culture today ?- Pulmonary planning bronc on Wednesday 3/22 ?

## 2021-11-08 NOTE — Assessment & Plan Note (Signed)
-   continue statin therapy. 

## 2021-11-08 NOTE — Assessment & Plan Note (Signed)
Blood c/s growing E Coli in 1 of 4 bottles (anaerobic) -continue meropenem per ID.  Repeat blood cultures today ?

## 2021-11-08 NOTE — Assessment & Plan Note (Signed)
Eliquis on hold for planned bronc on Wednesday ?

## 2021-11-08 NOTE — Consult Note (Signed)
? ?[facility] Cardiology Consultation Note  ?[name], MRN: [id number], DOB/AGE: [birth year] 88 y.o. ?Admit date: 11/05/2021   Date of Consult: 11/08/2021 ?Primary Physician: Olin Hauser, DO ? ?Chief Complaint:  ?Chief Complaint  ?Patient presents with  ?? Shortness of Breath  ?? Fever  ? ?Reason for Consult:  Atrial fibrillation ? ?HPI: 88 y.o. female with known peripheral vascular disease status post apparent previous intervention hypertension hyperlipidemia chronic kidney disease coronary artery atherosclerosis and paroxysmal nonvalvular atrial fibrillation with symptomatic bradycardia status post pacemaker placement.  The patient has been doing well from the cardiovascular standpoint with all the above issues in the recent months.  The patient has remained on high intensity cholesterol therapy medication management for chronic kidney disease, and has had normal blood pressure.  Recent pacemaker evaluation showed normal sinus rhythm with ventricular pacing and normal pacemaker function.  The patient has been admitted to the hospital for pulmonary issues and is needing further evaluation for possible bronchoscopy and treatment options.  The patient currently has been without evidence of cardiac symptoms including no evidence of angina or significant congestive heart failure.  Therefore the patient will be at low risk for cardiovascular complication with bronchoscopy for further evaluation and treatment options ? ?Past Medical History:  ?Diagnosis Date  ?? Acute postoperative pain 02/09/2018  ?? Arrhythmia, sinus node 04/03/2014  ?? Arthritis   ?? Arthritis   ?? Atrial fibrillation ([place])   ?? Back pain   ? lower back chronic  ?? Bradycardia   ?? Breast cancer ([place]) 2004  ? right breast cancer  ?? Cancer [facility]) 2004  ? rt breast cancer-post lumpectomy- chemo/rad  ?? CHF (congestive heart failure) ([place])   ?? Chronic back pain   ?? CKD (chronic kidney disease)   ?? COPD (chronic obstructive  pulmonary disease) ([place])   ? wears O2 at 2L via [place] at night  ?? COVID-19 06/11/2020  ?? Cystocele   ?? Decubitus ulcers   ?? Dehydration   ?? Gout   ?? Hematuria   ?? Hepatitis C   ?? Hyperlipidemia   ?? Hypertension   ?? Hypomagnesemia 06/25/2015  ?? NSTEMI (non-ST elevated myocardial infarction) ([place]) 04/08/2021  ?? OAB (overactive bladder)   ?? Overactive bladder   ?? Overdose opiate, accidental or unintentional, sequela 10/02/2020  ?? Restless leg   ?? Shoulder pain, bilateral   ?? Urinary frequency   ?? UTI (lower urinary tract infection)   ?? Vaginal atrophy   ?   ? ?Surgical History:  ?Past Surgical History:  ?Procedure Laterality Date  ?? ABDOMINAL HYSTERECTOMY    ?? BACK SURGERY    ?? BREAST LUMPECTOMY Right 2004  ?? BREAST SURGERY    ? rt lumpectomy  ?? CARPAL TUNNEL RELEASE    ?? CATARACT EXTRACTION W/PHACO  10/19/2011  ? Procedure: CATARACT EXTRACTION PHACO AND INTRAOCULAR LENS PLACEMENT (IOC);  Surgeon: Elta Guadeloupe T. Gershon Crane, MD;  Location: [facility];  Service: Ophthalmology;  Laterality: Right;  CDE:10.81  ?? CATARACT EXTRACTION W/PHACO  11/02/2011  ? Procedure: CATARACT EXTRACTION PHACO AND INTRAOCULAR LENS PLACEMENT (IOC);  Surgeon: Elta Guadeloupe T. Gershon Crane, MD;  Location: [facility];  Service: Ophthalmology;  Laterality: Left;  CDE 8.60  ?? CHOLECYSTECTOMY    ? 3/18  ?? COLON SURGERY    ?? INSERT / REPLACE / REMOVE PACEMAKER    ?? JOINT REPLACEMENT    ? bilateral TKA  ?? LOWER EXTREMITY ANGIOGRAPHY Left 10/14/2020  ? Procedure: LOWER EXTREMITY ANGIOGRAPHY;  Surgeon: Hortencia Pilar  G, MD;  Location: [place] [facility];  Service: Cardiovascular;  Laterality: Left;  ?? LOWER EXTREMITY ANGIOGRAPHY Right 09/08/2021  ? Procedure: LOWER EXTREMITY ANGIOGRAPHY;  Surgeon: Katha Cabal, MD;  Location: [place] [facility];  Service: Cardiovascular;  Laterality: Right;  ?? PACEMAKER INSERTION N/A 12/24/2015  ? Procedure: INSERTION PACEMAKER;  Surgeon: Isaias Cowman, MD;  Location: [facility] ORS;  Service: Cardiovascular;   Laterality: N/A;  ?? ROTATOR CUFF REPAIR    ? bilateral  ?  ? ?Home Meds: ?Prior to Admission medications   ?Medication Sig Start Date End Date Taking? Authorizing Provider  ?acetaminophen (TYLENOL) 500 MG tablet Take 500 mg by mouth in the morning and at bedtime.   Yes [provider]  ?albuterol (PROVENTIL) (2.5 MG/3ML) 0.083% nebulizer solution USE 1 VIAL IN NEBULIZER EVERY 8 HOURS AS NEEDED FOR WHEEZING OR SHORTNESS OF BREATH. 10/20/21  Yes Karamalegos, Devonne Doughty, DO  ?apixaban (ELIQUIS) 2.5 MG TABS tablet Take 1 tablet (2.5 mg total) by mouth 2 (two) times daily. 04/09/21  Yes Wieting, [name], MD  ?atorvastatin (LIPITOR) 20 MG tablet TAKE 1 TABLET BY MOUTH ONCE DAILY FOR CHOLESTEROL 02/17/21  Yes Karamalegos, Devonne Doughty, DO  ?bisacodyl (DULCOLAX) 5 MG EC tablet Take 2 tablets (10 mg total) by mouth daily as needed for moderate constipation. 07/04/20  Yes Olin Hauser, DO  ?Cholecalciferol (VITAMIN D) 2000 units tablet Take 2,000 Units by mouth daily.    Yes [provider]  ?diclofenac Sodium (VOLTAREN) 1 % GEL Apply 4 g topically 2 (two) times daily as needed (As needed for pain). 10/15/21  Yes Kris Hartmann, NP  ?furosemide (LASIX) 40 MG tablet Take 1 tablet (40 mg total) by mouth daily. 09/23/21  Yes Nicole Kindred A, DO  ?gabapentin (NEURONTIN) 100 MG capsule TAKE 3 CAPSULES BY MOUTH AT BEDTIME 06/29/21  Yes Karamalegos, Alexander J, DO  ?GNP ASPIRIN LOW DOSE 81 MG EC tablet TAKE 1 TABLET BY MOUTH ONCE DAILY. *SWALLOW WHOLE* 08/18/21  Yes Schnier, Dolores Lory, MD  ?levocetirizine (XYZAL) 5 MG tablet TAKE (1/2) TABLET BY MOUTH EVERY OTHER DAY. 06/19/21  Yes Olin Hauser, DO  ?Melatonin 10 MG TABS Take 10 mg by mouth at bedtime.   Yes [provider]  ?oxyCODONE (OXY IR/ROXICODONE) 5 MG immediate release tablet Take 2 tablets (10 mg total) by mouth 2 (two) times daily. May also take 1 tablet (5 mg total) at bedtime as needed for severe pain. Must last 30 days..  10/24/21 11/23/21 Yes Milinda Pointer, MD  ?rOPINIRole (REQUIP) 2 MG tablet TAKE 2 TABLETS BY MOUTH AT BEDTIME 10/21/21  Yes Karamalegos, Devonne Doughty, DO  ?BREZTRI AEROSPHERE 160-9-4.8 MCG/ACT AERO Inhale 2 puffs into the lungs 2 (two) times daily. 07/07/21   Karamalegos, Devonne Doughty, DO  ?OXYGEN Inhale 2 L into the lungs daily.    [provider]  ? ? ?Inpatient Medications:  ?? atorvastatin  20 mg Oral Daily  ?? cholecalciferol  2,000 Units Oral Daily  ?? furosemide  40 mg Intravenous Daily  ?? gabapentin  300 mg Oral TID  ?? guaiFENesin  600 mg Oral BID  ?? heparin injection (subcutaneous)  5,000 Units Subcutaneous Q12H  ?? ipratropium-albuterol  3 mL Nebulization TID  ?? melatonin  10 mg Oral QHS  ?? [START ON 11/09/2021] predniSONE  30 mg Oral Q breakfast  ?? rOPINIRole  4 mg Oral QHS  ? ?? sodium chloride 20 mL/hr at 11/05/21 1314  ?? meropenem (MERREM) IV 1 g (11/08/21  0228)  ? ? ?Allergies:  ?Allergies  ?Allergen Reactions  ?? Flexeril [Cyclobenzaprine Hcl]   ?  Severe Rash  ?? Vancomycin Rash  ?  [place] Syndrome  ? ? ?Social History  ? ?Socioeconomic History  ?? Marital status: Single  ?  Spouse name: Not on file  ?? Number of children: 2  ?? Years of education: 72  ?? Highest education level: 12th grade  ?Occupational History  ?? Occupation: retired  ?Tobacco Use  ?? Smoking status: Former  ?  Packs/day: 1.25  ?  Years: 40.00  ?  Pack years: 50.00  ?  Types: Cigarettes  ?  Quit date: 12/16/1998  ?  Years since quitting: 22.9  ?? Smokeless tobacco: Never  ?Vaping Use  ?? Vaping Use: Never used  ?Substance and Sexual Activity  ?? Alcohol use: No  ?  Alcohol/week: 0.0 standard drinks  ?? Drug use: Yes  ?  Types: Oxycodone  ?? Sexual activity: Not Currently  ?  Birth control/protection: Surgical  ?Other Topics Concern  ?? Not on file  ?Social History Narrative  ?? Not on file  ? ?Social Determinants of Health  ? ?Financial Resource Strain: Not on file  ?Food Insecurity: No Food Insecurity  ?? Worried  About Charity fundraiser in the Last Year: Never true  ?? Ran Out of Food in the Last Year: Never true  ?Transportation Needs: No Transportation Needs  ?? Lack of Transportation (Medical): No  ?? Lack of Trans

## 2021-11-09 ENCOUNTER — Encounter: Payer: Self-pay | Admitting: *Deleted

## 2021-11-09 DIAGNOSIS — R918 Other nonspecific abnormal finding of lung field: Secondary | ICD-10-CM | POA: Diagnosis not present

## 2021-11-09 DIAGNOSIS — J189 Pneumonia, unspecified organism: Secondary | ICD-10-CM | POA: Diagnosis not present

## 2021-11-09 DIAGNOSIS — Z1612 Extended spectrum beta lactamase (ESBL) resistance: Secondary | ICD-10-CM | POA: Diagnosis not present

## 2021-11-09 DIAGNOSIS — A419 Sepsis, unspecified organism: Secondary | ICD-10-CM | POA: Diagnosis not present

## 2021-11-09 DIAGNOSIS — R591 Generalized enlarged lymph nodes: Secondary | ICD-10-CM

## 2021-11-09 DIAGNOSIS — R911 Solitary pulmonary nodule: Secondary | ICD-10-CM | POA: Diagnosis not present

## 2021-11-09 DIAGNOSIS — J9601 Acute respiratory failure with hypoxia: Secondary | ICD-10-CM | POA: Diagnosis not present

## 2021-11-09 DIAGNOSIS — R7881 Bacteremia: Secondary | ICD-10-CM | POA: Diagnosis not present

## 2021-11-09 LAB — HEPATITIS B SURFACE ANTIBODY, QUANTITATIVE: Hep B S AB Quant (Post): 7.9 m[IU]/mL — ABNORMAL LOW (ref >9.9)

## 2021-11-09 LAB — LEGIONELLA PNEUMOPHILA SEROGP 1 UR AG: L. pneumophila Serogp 1 Ur Ag: NEGATIVE

## 2021-11-09 NOTE — Progress Notes (Signed)
?    [facility] for Infectious Disease   ? ?Date of Admission:  11/05/2021   Total days of antibiotics 5 meropenem ?        ? ? ?ID: Kristin [name] is a 88 y.o. female with esbl ecoli bacteremia ?Active Problems: ?  Acute respiratory failure with hypoxia ([place]) ?  Paroxysmal atrial fibrillation (HCC) ?  Postobstructive pneumonia ?  Mass of upper lobe of right lung ?  Sepsis due to pneumonia [facility]) ?  Dyslipidemia ?  Bacteremia ? ? ? ?Subjective: ?Afebrile, does get DOE with PT. Doesn't feel significantly better since abtx started. Still productive cough ? ?Medications:  ? atorvastatin  20 mg Oral Daily  ? cholecalciferol  2,000 Units Oral Daily  ? furosemide  40 mg Intravenous Daily  ? gabapentin  300 mg Oral TID  ? guaiFENesin  600 mg Oral BID  ? heparin injection (subcutaneous)  5,000 Units Subcutaneous Q12H  ? ipratropium-albuterol  3 mL Nebulization TID  ? melatonin  10 mg Oral QHS  ? predniSONE  30 mg Oral Q breakfast  ? rOPINIRole  4 mg Oral QHS  ? ? ?Objective: ?Vital signs in last 24 hours: ?Temp:  [97.5 ?F (36.4 ?C)-98.1 ?F (36.7 ?C)] 97.5 ?F (36.4 ?C) (03/20 1513) ?Pulse Rate:  [60-66] 61 (03/20 1513) ?Resp:  [16-20] 18 (03/20 1513) ?BP: (120-141)/(62-81) 120/62 (03/20 1513) ?SpO2:  [95 %-100 %] 97 % (03/20 1513) ?Physical Exam  ?Constitutional:  oriented to person, place, and time. appears well-developed and well-nourished. No distress.  ?HENT: [place]/AT, PERRLA, no scleral icterus ?Mouth/Throat: Oropharynx is clear and moist. No oropharyngeal exudate.  ?Cardiovascular: Normal rate, regular rhythm and normal heart sounds. Exam reveals no gallop and no friction rub.  ?No murmur heard.  ?Pulmonary/Chest: Effort normal and breath sounds+ wheezes.  ?Neck = supple, no nuchal rigidity ?Abdominal: Soft. Bowel sounds are normal.  exhibits no distension. There is no tenderness.  ?Lymphadenopathy: no cervical adenopathy. No axillary adenopathy ?Neurological: alert and oriented to person, place, and time.  ?Skin: Skin  is warm and dry. No rash noted. No erythema.  ?Psychiatric: a normal mood and affect.  behavior is normal.  ? ? ?Lab Results ?Recent Labs  ?  11/08/21 ?0431  ?WBC 8.4  ?HGB 10.0*  ?HCT 32.6*  ?NA 137  ?K 3.9  ?CL 110  ?CO2 21*  ?BUN 31*  ?CREATININE 1.08*  ? ?Lab Results  ?Component Value Date  ? ESRSEDRATE 12 09/24/2015  ? ? ?Microbiology: ?3/19 blood cx ngtd ?3/16 blood cx esbl ecoli ?Studies/Results: ?No results found. ? ? ?Assessment/Plan: ?Esbl ecoli bacteremia = currently suspect it is pulmonary source (has remote hx of uti with esbl but denies any urinary symptoms). Plan to continue to treat with meropenem, and transition to ertapenem at discharge via midline to complete 14 day course ? ?Pulmonary mass = to get bronchoscopy tues/wed ? ?Carlyle Basques ?[facility] for Infectious Diseases ?Pager: [phone] ? ?11/09/2021, 5:27 PM ? ? ? ? ? ?

## 2021-11-09 NOTE — Assessment & Plan Note (Signed)
Eliquis on hold for planned bronc on Wednesday ?

## 2021-11-09 NOTE — Assessment & Plan Note (Signed)
-   continue statin therapy. 

## 2021-11-09 NOTE — Assessment & Plan Note (Signed)
Present on admission blood c/s growing E Coli in 1 of 4 bottles (anaerobic) -continue IV meropenem.  Repeat blood cultures on 3/19 negative thus far.  ID following ?

## 2021-11-09 NOTE — Progress Notes (Signed)
Occupational Therapy Treatment ?Patient Details ?Name: Kristin [name] ?MRN: [id number] ?DOB: 27-Apr-1930 ?Today's Date: 11/09/2021 ? ? ?History of present illness Pt is a 88 y/o F admitted on 11/05/21 after presenting with c/c of SOB. fever & chills. CT showed posterior right upper lobe lung mass, new, suspicious for primary bronchogenic carcinoma & two new indistinct peribronchovascular pulmonary nodules in the superior segment right lower lobe. Pt is being treated for postobstructive PNA. PMH: CHF, COPD, Hep C, dyslipidemia, CAD, HTN, paroxysmal a-fib on Eliquis, breast CA, CKD, NSTEMI ?  ?OT comments ? Upon entering the room, pt supine in bed and agreeable to OT intervention. Pt declines need for toileting and requesting to ambulate this session. Pt performs bed mobility at mod I level without physical assistance to EOB. Pt stands with supervision and ambulates to locate mask in room and donning without assist. Pt ambulating with supervision 200' with RW while on 2 Ls O2 via [place]. Pt reports being on 2Ls at baseline. Pt returning to room in same manner and min A for B LEs to return to supine. Pt making excellent progress towards goals and continues to benefit from OT intervention. Continued recommendation for home health OT at discharge to address functional deficits.   ? ?Recommendations for follow up therapy are one component of a multi-disciplinary discharge planning process, led by the attending physician.  Recommendations may be updated based on patient status, additional functional criteria and insurance authorization. ?   ?Follow Up Recommendations ? Home health OT  ?  ?Assistance Recommended at Discharge Intermittent Supervision/Assistance  ?Patient can return home with the following ? Assistance with cooking/housework;Assist for transportation ?  ?Equipment Recommendations ? Other (comment);None recommended by OT (pt has all needed equipment)  ?  ?   ?Precautions / Restrictions Precautions ?Precautions:  Fall ?Restrictions ?Weight Bearing Restrictions: No  ? ? ?  ? ?Mobility Bed Mobility ?Overal bed mobility: Needs Assistance ?Bed Mobility: Supine to Sit, Sit to Supine ?  ?  ?Supine to sit: Modified independent (Device/Increase time), HOB elevated ?Sit to supine: Min assist ?  ?  ?  ? ?Transfers ?Overall transfer level: Needs assistance ?Equipment used: Rolling walker (2 wheels) ?Transfers: Sit to/from Stand ?Sit to Stand: Supervision ?  ?  ?  ?  ?  ?  ?  ?  ?Balance Overall balance assessment: Needs assistance ?Sitting-balance support: Feet supported ?Sitting balance-Leahy Scale: Good ?  ?  ?Standing balance support: During functional activity, Bilateral upper extremity supported, Reliant on assistive device for balance ?Standing balance-Leahy Scale: Fair ?  ?  ?  ?  ?  ?  ?  ?  ?  ?  ?  ?  ?   ? ?ADL either performed or assessed with clinical judgement  ? ?ADL Overall ADL's : Needs assistance/impaired ?  ?  ?Grooming: Wash/dry hands;Standing;Supervision/safety ?  ?  ?  ?  ?  ?  ?  ?  ?  ?  ?  ?  ?  ?  ?  ?  ?  ?  ? ?Extremity/Trunk Assessment Upper Extremity Assessment ?Upper Extremity Assessment: Generalized weakness ?  ?Lower Extremity Assessment ?Lower Extremity Assessment: Generalized weakness ?  ?  ?  ? ?Vision Patient Visual Report: No change from baseline ?  ?  ?   ?   ? ?Cognition Arousal/Alertness: Awake/alert ?Behavior During Therapy: [facility] for tasks assessed/performed ?Overall Cognitive Status: Within Functional Limits for tasks assessed ?  ?  ?  ?  ?  ?  ?  ?  ?  ?  ?  ?  ?  ?  ?  ?  ?  General Comments: Pt is pleasant and cooperative throughout ?  ?  ?   ?   ?   ?   ? ? ?Pertinent Vitals/ Pain       Pain Assessment ?Pain Assessment: Faces ?Faces Pain Scale: Hurts a little bit ?Pain Location: back and BLE ?Pain Descriptors / Indicators: Aching, Constant, Discomfort ?Pain Intervention(s): Monitored during session, Repositioned, Premedicated before session ? ?   ?   ? ?Frequency ? Min 2X/week  ? ? ? ? ?   ?Progress Toward Goals ? ?OT Goals(current goals can now be found in the care plan section) ? Progress towards OT goals: Progressing toward goals ? ?Acute Rehab OT Goals ?Patient Stated Goal: to go home ?OT Goal Formulation: With patient ?Time For Goal Achievement: 11/20/21 ?Potential to Achieve Goals: Good  ?Plan Discharge plan remains appropriate;Frequency remains appropriate   ? ?   ?AM-PAC OT "6 Clicks" Daily Activity     ?Outcome Measure ? ? Help from another person eating meals?: None ?Help from another person taking care of personal grooming?: None ?Help from another person toileting, which includes using toliet, bedpan, or urinal?: A Little ?Help from another person bathing (including washing, rinsing, drying)?: A Little ?Help from another person to put on and taking off regular upper body clothing?: None ?Help from another person to put on and taking off regular lower body clothing?: A Little ?6 Click Score: 21 ? ?  ?End of Session Equipment Utilized During Treatment: Rolling walker (2 wheels);Oxygen ? ?OT Visit Diagnosis: Unsteadiness on feet (R26.81);Muscle weakness (generalized) (M62.81) ?  ?Activity Tolerance Patient tolerated treatment well ?  ?Patient Left in chair;with call bell/phone within reach;with chair alarm set ?  ?Nurse Communication Mobility status ?  ? ?   ? ?Time: 7078-6754 ?OT Time Calculation (min): 23 min ? ?Charges: OT General Charges ?$OT Visit: 1 Visit ?OT Treatments ?$Therapeutic Activity: 23-37 mins ? ?Darleen Crocker, [place], OTR/L , CBIS ?ascom [phone]  ?11/09/21, 3:10 PM  ?

## 2021-11-09 NOTE — Assessment & Plan Note (Signed)
-  Antibiotic changed to IV meropenem per ID due to ESBL E. coli growing in the blood.  Repeat blood culture from 3/19 are negative thus far ?- Pulmonary planning bronc on Wednesday 3/22 ?

## 2021-11-09 NOTE — Progress Notes (Signed)
? ? ? ?PULMONOLOGY ? ? ? ? ? ? ? ? ?Date: 11/09/2021,   ?MRN# [id number] Kristin [name] 16-Mar-1930 ? ? ?  ?AdmissionWeight: 77.2 kg                 ?CurrentWeight: 77.2 kg ? ? ?Referring physician: Dr Sidney Ace ? ? ?CHIEF COMPLAINT:  ? ?Lung mass and mediastinal lymphadenopathy ? ? ?HISTORY OF PRESENT ILLNESS  ? ?This is a pleasant 88 yr old F, she quit smoking 35 years ago per patient and is in good health not demented. We talked about her medical history which is listed belowe in [facility] history.  She clearly states she wants everything done.  She has reviewed chest imaging with me and her wishes are to have full scope of therapy.  ? ?She has asked me to discuss these findings with her daughter [name] who is a Education officer, museum.  ? ?She does uses oxygen at home at 2L/min nasal canula and states this is chronic at least 2 years using mostly nocturnally and recently started using during daytime.  ? ?She is ambulatory and stable. ? ?11/09/21- patient is stable no overnight events. S/p cardio clearance. Plan for bronch Wed 12pm ? ?PAST MEDICAL HISTORY  ? ?Past Medical History:  ?Diagnosis Date  ?? Acute postoperative pain 02/09/2018  ?? Arrhythmia, sinus node 04/03/2014  ?? Arthritis   ?? Arthritis   ?? Atrial fibrillation ([place])   ?? Back pain   ? lower back chronic  ?? Bradycardia   ?? Breast cancer ([place]) 2004  ? right breast cancer  ?? Cancer [facility]) 2004  ? rt breast cancer-post lumpectomy- chemo/rad  ?? CHF (congestive heart failure) ([place])   ?? Chronic back pain   ?? CKD (chronic kidney disease)   ?? COPD (chronic obstructive pulmonary disease) ([place])   ? wears O2 at 2L via [place] at night  ?? COVID-19 06/11/2020  ?? Cystocele   ?? Decubitus ulcers   ?? Dehydration   ?? Gout   ?? Hematuria   ?? Hepatitis C   ?? Hyperlipidemia   ?? Hypertension   ?? Hypomagnesemia 06/25/2015  ?? NSTEMI (non-ST elevated myocardial infarction) ([place]) 04/08/2021  ?? OAB (overactive bladder)   ?? Overactive bladder   ?? Overdose opiate, accidental or  unintentional, sequela 10/02/2020  ?? Restless leg   ?? Shoulder pain, bilateral   ?? Urinary frequency   ?? UTI (lower urinary tract infection)   ?? Vaginal atrophy   ? ? ? ?SURGICAL HISTORY  ? ?Past Surgical History:  ?Procedure Laterality Date  ?? ABDOMINAL HYSTERECTOMY    ?? BACK SURGERY    ?? BREAST LUMPECTOMY Right 2004  ?? BREAST SURGERY    ? rt lumpectomy  ?? CARPAL TUNNEL RELEASE    ?? CATARACT EXTRACTION W/PHACO  10/19/2011  ? Procedure: CATARACT EXTRACTION PHACO AND INTRAOCULAR LENS PLACEMENT (IOC);  Surgeon: Elta Guadeloupe T. Gershon Crane, MD;  Location: [facility];  Service: Ophthalmology;  Laterality: Right;  CDE:10.81  ?? CATARACT EXTRACTION W/PHACO  11/02/2011  ? Procedure: CATARACT EXTRACTION PHACO AND INTRAOCULAR LENS PLACEMENT (IOC);  Surgeon: Elta Guadeloupe T. Gershon Crane, MD;  Location: [facility];  Service: Ophthalmology;  Laterality: Left;  CDE 8.60  ?? CHOLECYSTECTOMY    ? 3/18  ?? COLON SURGERY    ?? INSERT / REPLACE / REMOVE PACEMAKER    ?? JOINT REPLACEMENT    ? bilateral TKA  ?? LOWER EXTREMITY ANGIOGRAPHY Left 10/14/2020  ? Procedure: LOWER EXTREMITY ANGIOGRAPHY;  Surgeon: Katha Cabal, MD;  Location: [facility];  Service: Cardiovascular;  Laterality: Left;  ?? LOWER EXTREMITY ANGIOGRAPHY Right 09/08/2021  ? Procedure: LOWER EXTREMITY ANGIOGRAPHY;  Surgeon: Katha Cabal, MD;  Location: [facility];  Service: Cardiovascular;  Laterality: Right;  ?? PACEMAKER INSERTION N/A 12/24/2015  ? Procedure: INSERTION PACEMAKER;  Surgeon: Isaias Cowman, MD;  Location: [facility] ORS;  Service: Cardiovascular;  Laterality: N/A;  ?? ROTATOR CUFF REPAIR    ? bilateral  ? ? ? ?FAMILY HISTORY  ? ?Family History  ?Problem Relation Age of Onset  ?? Kidney disease Brother   ?? Heart disease Father   ?? Heart disease Mother   ?? Stroke Mother   ?? Cancer Sister   ?? Breast cancer Sister 54  ?? Breast cancer Paternal Aunt   ?? Anesthesia problems Neg Hx   ?? Hypotension Neg Hx   ?? Malignant hyperthermia Neg Hx   ??  Pseudochol deficiency Neg Hx   ? ? ? ?SOCIAL HISTORY  ? ?Social History  ? ?Tobacco Use  ?? Smoking status: Former  ?  Packs/day: 1.25  ?  Years: 40.00  ?  Pack years: 50.00  ?  Types: Cigarettes  ?  Quit date: 12/16/1998  ?  Years since quitting: 22.9  ?? Smokeless tobacco: Never  ?Vaping Use  ?? Vaping Use: Never used  ?Substance Use Topics  ?? Alcohol use: No  ?  Alcohol/week: 0.0 standard drinks  ?? Drug use: Yes  ?  Types: Oxycodone  ? ? ? ?MEDICATIONS  ? ? ?Home Medication:  ? ?  ?Current Medication: ? ?Current Facility-Administered Medications:  ??  0.9 %  sodium chloride infusion, , Intravenous, Continuous, Menshew, Jenise V Bacon, PA-C, Last Rate: 20 mL/hr at 11/05/21 1314, New Bag at 11/05/21 1314 ??  acetaminophen (TYLENOL) tablet 650 mg, 650 mg, Oral, Q6H PRN, 650 mg at 11/07/21 0952 **OR** acetaminophen (TYLENOL) suppository 650 mg, 650 mg, Rectal, Q6H PRN, Mansy, Jan A, MD ??  albuterol (PROVENTIL) (2.5 MG/3ML) 0.083% nebulizer solution 2.5 mg, 2.5 mg, Nebulization, Q4H PRN, Max Sane, MD, 2.5 mg at 11/06/21 1320 ??  atorvastatin (LIPITOR) tablet 20 mg, 20 mg, Oral, Daily, Mansy, Jan A, MD, 20 mg at 11/09/21 2694 ??  bisacodyl (DULCOLAX) EC tablet 10 mg, 10 mg, Oral, Daily PRN, Mansy, Arvella Merles, MD ??  cetirizine (ZYRTEC) tablet 5 mg, 5 mg, Oral, Daily PRN, Mansy, Arvella Merles, MD ??  chlorpheniramine-HYDROcodone 10-8 MG/5ML suspension 5 mL, 5 mL, Oral, Q12H PRN, Mansy, Jan A, MD, 5 mL at 11/08/21 2147 ??  cholecalciferol (VITAMIN D3) tablet 2,000 Units, 2,000 Units, Oral, Daily, Mansy, Jan A, MD, 2,000 Units at 11/09/21 8546 ??  diclofenac Sodium (VOLTAREN) 1 % topical gel 4 g, 4 g, Topical, BID PRN, Mansy, Jan A, MD, 4 g at 11/08/21 2136 ??  furosemide (LASIX) injection 40 mg, 40 mg, Intravenous, Daily, Lanney Gins, Dyson Sevey, MD, 40 mg at 11/09/21 0919 ??  gabapentin (NEURONTIN) capsule 300 mg, 300 mg, Oral, TID, Ottie Glazier, MD, 300 mg at 11/09/21 0918 ??  guaiFENesin (MUCINEX) 12 hr tablet 600 mg, 600 mg,  Oral, BID, Mansy, Jan A, MD, 600 mg at 11/09/21 2703 ??  heparin injection 5,000 Units, 5,000 Units, Subcutaneous, Q12H, Ottie Glazier, MD, 5,000 Units at 11/09/21 0919 ??  ipratropium-albuterol (DUONEB) 0.5-2.5 (3) MG/3ML nebulizer solution 3 mL, 3 mL, Nebulization, TID, Max Sane, MD, 3 mL at 11/09/21 0745 ??  magnesium hydroxide (MILK OF MAGNESIA) suspension 30 mL, 30 mL, Oral, Daily PRN, Mansy,  Arvella Merles, MD ??  melatonin tablet 10 mg, 10 mg, Oral, QHS, Mansy, Jan A, MD, 10 mg at 11/08/21 2129 ??  meropenem (MERREM) 1 g in sodium chloride 0.9 % 100 mL IVPB, 1 g, Intravenous, Q12H, Sharion Settler, NP, Last Rate: 200 mL/hr at 11/09/21 0346, 1 g at 11/09/21 0346 ??  metoCLOPramide (REGLAN) injection 10 mg, 10 mg, Intravenous, Q6H PRN, Mansy, Jan A, MD ??  [COMPLETED] methylPREDNISolone sodium succinate (SOLU-MEDROL) 125 mg/2 mL injection 80 mg, 80 mg, Intravenous, Q24H, 80 mg at 11/06/21 4827 **FOLLOWED BY** predniSONE (DELTASONE) tablet 30 mg, 30 mg, Oral, Q breakfast, Lanney Gins, Valeen Borys, MD, 30 mg at 11/09/21 0918 ??  rOPINIRole (REQUIP) tablet 4 mg, 4 mg, Oral, QHS, Mansy, Jan A, MD, 4 mg at 11/08/21 2129 ??  traZODone (DESYREL) tablet 25 mg, 25 mg, Oral, QHS PRN, Mansy, Arvella Merles, MD ? ? ? ?ALLERGIES  ? ?Flexeril [cyclobenzaprine hcl] and Vancomycin ? ? ? ? ?REVIEW OF SYSTEMS  ? ? ?Review of Systems: ? ?Gen:  Denies  fever, sweats, chills weigh loss  ?HEENT: Denies blurred vision, double vision, ear pain, eye pain, hearing loss, nose bleeds, sore throat ?Cardiac:  No dizziness, chest pain or heaviness, chest tightness,edema ?Resp:   Denies cough or sputum porduction, shortness of breath,wheezing, hemoptysis,  ?Gi: Denies swallowing difficulty, stomach pain, nausea or vomiting, diarrhea, constipation, bowel incontinence ?Gu:  Denies bladder incontinence, burning urine ?Ext:   Denies Joint pain, stiffness or swelling ?Skin: Denies  skin rash, easy bruising or bleeding or hives ?Endoc:  Denies polyuria, polydipsia ,  polyphagia or weight change ?Psych:   Denies depression, insomnia or hallucinations  ? ?Other:  All other systems negative ? ? ?VS: BP 124/78 (BP Location: Right Arm)   Pulse 60   Temp 98 ?F (36.7 ?C) (Axillary)   Res

## 2021-11-09 NOTE — Progress Notes (Signed)
?  Progress Note ? ? ?Patient: Kristin [name] HAF:[id number] DOB: 1930-07-05 DOA: 11/05/2021     4 ?DOS: the patient was seen and examined on 11/09/2021 ?  ?Brief hospital course: ?88 y.o. female with medical history significant for CHF, COPD, hepatitis C, dyslipidemia, coronary artery disease, hypertension, dyslipidemia and paroxysmal atrial fibrillation on Eliquis, who presented to the ER with acute onset of dyspnea and hypoxemia at her PCPs office with pulse ox of 82% on room air ? ?3/17: Oncology and pulmonary consult for right upper lobe mass concerning for malignancy ?3/18: Reports worsening neuropathy pain in the leg so increase the dose of gabapentin.  ID started meropenem for ESBL E. coli growing in the blood.  ?3/19: Stopped IV fluid, repeating blood culture, restarting Lasix ?3/20: Repeat blood cultures from yesterday negative. Pulmonary planning bronc on Wednesday 3/22 ? ? ?Assessment and Plan: ?Postobstructive pneumonia ?-Antibiotic changed to IV meropenem per ID due to ESBL E. coli growing in the blood.  Repeat blood culture from 3/19 are negative thus far ?- Pulmonary planning bronc on Wednesday 3/22 ? ?Mass of upper lobe of right lung ?- Appreciate pulmonary and oncology input.  Bronch planned for 3/22 at 10 AM per pulmonary.  Cardiology cleared ? ?Sepsis due to pneumonia [facility]) ?Present on admission blood c/s growing E Coli in 1 of 4 bottles (anaerobic) -continue IV meropenem.  Repeat blood cultures on 3/19 negative thus far.  ID following ? ?Acute respiratory failure with hypoxia ([place]) ?Weaned off to 2 L oxygen.  Continue Lasix 40 mg IV daily. ?Net IO Since Admission: -5,161.67 mL [11/09/21 1110]  ?Continue weaning her oxygen off.  Ambulate daily check pulse ox on ambulation ? ?Bacteremia ?Blood c/s growing E Coli in 1 of 4 bottles (anaerobic) -continue meropenem per ID.  Repeat blood cultures on 3/19 so far negative ? ?Dyslipidemia ?- continue statin therapy ? ?Paroxysmal atrial fibrillation  (HCC) ?Eliquis on hold for planned bronc on Wednesday ? ? ? ? ?  ? ?Subjective: No new issues ? ?Physical Exam: ?Vitals:  ? 11/08/21 2028 11/08/21 2033 11/09/21 0119 11/09/21 0745  ?BP: (!) 141/73  124/81   ?Pulse: 64  66   ?Resp: 18  16   ?Temp: 98.1 ?F (36.7 ?C)  98 ?F (36.7 ?C)   ?TempSrc:   Oral   ?SpO2: 99% 98% 95% 95%  ?Weight:      ?Height:      ? ?88 year old female lying in the bed comfortably without any acute distress ?Eyes pupil equal round reactive to light and accommodation, no scleral icterus. ?Extraocular muscles intact ?Lungs decreased breath sounds at the bases ?Cardiovascular S1-S2 normal, no murmur rales or gallop.   ?Abdomen soft, benign ?Neuro awake, nonfocal ?Skin no rash or lesion ?Psych normal mood and affect ? ?Data Reviewed: ? ?There are no new results to review at this time. ? ?Family Communication: None ? ?Disposition: ?Status is: Inpatient ?Remains inpatient appropriate because: Bronchoscopy this Wednesday ? ? Planned Discharge Destination: Home with Home Health ? ? ? DVT prophylaxis-subcu heparin ?Time spent: 25 minutes ? ?Author: ?Max Sane, MD ?11/09/2021 11:11 AM ? ?For on call review [email or web address].  ?

## 2021-11-09 NOTE — Assessment & Plan Note (Signed)
Weaned off to 2 L oxygen.  Continue Lasix 40 mg IV daily. ?Net IO Since Admission: -5,161.67 mL [11/09/21 1110]  ?Continue weaning her oxygen off.  Ambulate daily check pulse ox on ambulation ?

## 2021-11-09 NOTE — Progress Notes (Signed)
Physical Therapy Treatment ?Patient Details ?Name: Kristin [name] ?MRN: [id number] ?DOB: 1930/06/26 ?Today's Date: 11/09/2021 ? ? ?History of Present Illness Pt is a 88 y/o F admitted on 11/05/21 after presenting with c/c of SOB. fever & chills. CT showed posterior right upper lobe lung mass, new, suspicious for primary bronchogenic carcinoma & two new indistinct peribronchovascular pulmonary nodules in the superior segment right lower lobe. Pt is being treated for postobstructive PNA. PMH: CHF, COPD, Hep C, dyslipidemia, CAD, HTN, paroxysmal a-fib on Eliquis, breast CA, CKD, NSTEMI ? ?  ?PT Comments  ? ? Pt seen for PT tx with pt agreeable. Pt reports she has been getting weaker since she hasn't been getting OOB much while in the hospital. Encouraged pt to ask nursing to assist her with ambulating each day. Pt is able to complete supine>sit & sit>stand with mod I. Pt ambulates 1 lap around nurses station with RW & supervision with decreased gait speed. Once back in room pt declines further exercises at this time 2/2 fatigue. Will continue to follow pt acutely to progress endurance, balance, & strength. ?   ?Recommendations for follow up therapy are one component of a multi-disciplinary discharge planning process, led by the attending physician.  Recommendations may be updated based on patient status, additional functional criteria and insurance authorization. ? ?Follow Up Recommendations ? Home health PT ?  ?  ?Assistance Recommended at Discharge Intermittent Supervision/Assistance  ?Patient can return home with the following A little help with walking and/or transfers;A little help with bathing/dressing/bathroom;Assistance with cooking/housework;Assist for transportation;Direct supervision/assist for financial management;Direct supervision/assist for medications management ?  ?Equipment Recommendations ? Rolling walker (2 wheels)  ?  ?Recommendations for Other Services   ? ? ?  ?Precautions / Restrictions  Precautions ?Precautions: Fall ?Restrictions ?Weight Bearing Restrictions: No  ?  ? ?Mobility ? Bed Mobility ?Overal bed mobility: Modified Independent ?Bed Mobility: Supine to Sit ?  ?  ?Supine to sit: HOB elevated, Modified independent (Device/Increase time) ?  ?  ?  ?  ? ?Transfers ?  ?Equipment used: Rolling walker (2 wheels) ?Transfers: Sit to/from Stand ?Sit to Stand: Modified independent (Device/Increase time) ?  ?  ?  ?  ?  ?  ?  ? ?Ambulation/Gait ?Ambulation/Gait assistance: Supervision ?Gait Distance (Feet): 165 Feet ?Assistive device: Rolling walker (2 wheels) ?Gait Pattern/deviations: Decreased step length - right, Decreased step length - left, Decreased stride length ?Gait velocity: decreased ?  ?  ?General Gait Details: Pushes RW slightly out in front of her. ? ? ?Stairs ?  ?  ?  ?  ?  ? ? ?Wheelchair Mobility ?  ? ?Modified Rankin (Stroke Patients Only) ?  ? ? ?  ?Balance Overall balance assessment: Needs assistance ?Sitting-balance support: Feet supported ?Sitting balance-Leahy Scale: Good ?  ?  ?Standing balance support: During functional activity, Bilateral upper extremity supported ?Standing balance-Leahy Scale: Fair ?  ?  ?  ?  ?  ?  ?  ?  ?  ?  ?  ?  ?  ? ?  ?Cognition Arousal/Alertness: Awake/alert ?Behavior During Therapy: [facility] for tasks assessed/performed ?Overall Cognitive Status: Within Functional Limits for tasks assessed ?  ?  ?  ?  ?  ?  ?  ?  ?  ?  ?  ?  ?  ?  ?  ?  ?General Comments: Pleasant lady ?  ?  ? ?  ?Exercises   ? ?  ?General Comments General comments (skin integrity, edema, etc.): Pt  on 2L/min via nasal cannula throughout session with SPO2 95-98% at end of session with pt endorsing slight SOB after gait. Instructed pt to use incentive spirometer 10x/hour each day. ?  ?  ? ?Pertinent Vitals/Pain Pain Assessment ?Pain Assessment: No/denies pain  ? ? ?Home Living   ?  ?  ?  ?  ?  ?  ?  ?  ?  ?   ?  ?Prior Function    ?  ?  ?   ? ?PT Goals (current goals can now be found in the  care plan section) Acute Rehab PT Goals ?Patient Stated Goal: get better, return to PLOF ?PT Goal Formulation: With patient ?Time For Goal Achievement: 11/20/21 ?Potential to Achieve Goals: Good ?Progress towards PT goals: Progressing toward goals ? ?  ?Frequency ? ? ? Min 2X/week ? ? ? ?  ?PT Plan Current plan remains appropriate  ? ? ?Co-evaluation   ?  ?  ?  ?  ? ?  ?AM-PAC PT "6 Clicks" Mobility   ?Outcome Measure ? Help needed turning from your back to your side while in a flat bed without using bedrails?: None ?Help needed moving from lying on your back to sitting on the side of a flat bed without using bedrails?: A Little ?Help needed moving to and from a bed to a chair (including a wheelchair)?: A Little ?Help needed standing up from a chair using your arms (e.g., wheelchair or bedside chair)?: None ?Help needed to walk in hospital room?: A Little ?Help needed climbing 3-5 steps with a railing? : A Little ?6 Click Score: 20 ? ?  ?End of Session Equipment Utilized During Treatment: Oxygen ?Activity Tolerance: Patient tolerated treatment well;Patient limited by fatigue ?Patient left: in chair;with chair alarm set;with call bell/phone within reach ?Nurse Communication: Mobility status ?PT Visit Diagnosis: Muscle weakness (generalized) (M62.81) ?  ? ? ?Time: 9021-1155 ?PT Time Calculation (min) (ACUTE ONLY): 15 min ? ?Charges:  $Therapeutic Activity: 8-22 mins          ?          ? ?Lavone Nian, PT, DPT ?11/09/21, 11:07 AM ? ? ? ?[name] ?11/09/2021, 11:06 AM ? ?

## 2021-11-09 NOTE — Assessment & Plan Note (Signed)
-   Appreciate pulmonary and oncology input.  Bronch planned for 3/22 at 10 AM per pulmonary.  Cardiology cleared ?

## 2021-11-09 NOTE — Assessment & Plan Note (Signed)
Blood c/s growing E Coli in 1 of 4 bottles (anaerobic) -continue meropenem per ID.  Repeat blood cultures on 3/19 so far negative ?

## 2021-11-09 NOTE — Progress Notes (Signed)
Mobility Specialist - Progress Note ? ? 11/09/21 1400  ?Mobility  ?Activity Transferred from chair to bed  ?Level of Assistance Standby assist, set-up cues, supervision of patient - no hands on  ?Assistive Device None  ?Distance Ambulated (ft) 2 ft  ?Activity Response Tolerated well  ?$Mobility charge 1 Mobility  ? ? ? ?Pt transferred chair-bed with supervision. Alarm set, needs in reach.  ? ? ?Kathee Delton ?Mobility Specialist ?11/09/21, 2:33 PM ? ? ? ? ?

## 2021-11-09 NOTE — Progress Notes (Addendum)
Per Dr. Rogue Bussing, pt is currently admitted awaiting biopsy of lung mass. Pt will need further work up with PET scan and CT scan of the head once discharged. Will go ahead and place orders to start insurance authorization. Once pt is discharged, will schedule for appts and notify pt with appts. Will continue to follow. Nothing further needed at this time.  ?

## 2021-11-09 NOTE — Progress Notes (Signed)
[facility] Encounter Note ? ?Patient: Kristin [name] / Admit Date: 11/05/2021 / Date of Encounter: 11/09/2021, 7:25 AM ? ? ?Subjective: ?Patient feeling okay last night with no evidence of significant cardiovascular symptoms.  Patient is still mildly short of breath with oxygenation but no evidence of rhythm disturbances.  Patient remains at lowest risk possible for cardiovascular complication with bronchoscopy ? ?Review of Systems: ?Positive for: Shortness of breath ?Negative for: Vision change, hearing change, syncope, dizziness, nausea, vomiting,diarrhea, bloody stool, stomach pain, cough, congestion, diaphoresis, urinary frequency, urinary pain,skin lesions, skin rashes ?Others previously listed ? ?Objective: ?Telemetry: Normal sinus rhythm with ventricular pacing ?Physical Exam: Blood pressure 124/81, pulse 66, temperature 98 ?F (36.7 ?C), temperature source Oral, resp. rate 16, height 5\' 7"  (1.702 m), weight 77.2 kg, SpO2 95 %. Body mass index is 26.67 kg/m?. ?General: Well developed, well nourished, in no acute distress. ?Head: Normocephalic, atraumatic, sclera non-icteric, no xanthomas, nares are without discharge. ?Neck: No apparent masses ?Lungs: Normal respirations with few wheezes, some rhonchi, no rales , no crackles  ? Heart: Regular rate and rhythm, normal S1 S2, no murmur, no rub, no gallop, PMI is normal size and placement, carotid upstroke normal without bruit, jugular venous pressure normal ?Abdomen: Soft, non-tender, non-distended with normoactive bowel sounds. No hepatosplenomegaly. Abdominal aorta is normal size without bruit ?Extremities: No edema, no clubbing, no cyanosis, no ulcers,  ?Peripheral: 2+ radial, 2+ femoral, 2+ dorsal pedal pulses ?Neuro: Alert and oriented. Moves all extremities spontaneously. ?Psych:  Responds to questions appropriately with a normal affect. ? ? ?Intake/Output Summary (Last 24 hours) at 11/09/2021 0725 ?Last data filed at 11/08/2021  2100 ?Gross per 24 hour  ?Intake 120 ml  ?Output 2600 ml  ?Net -2480 ml  ? ? ?Inpatient Medications:  ?? atorvastatin  20 mg Oral Daily  ?? cholecalciferol  2,000 Units Oral Daily  ?? furosemide  40 mg Intravenous Daily  ?? gabapentin  300 mg Oral TID  ?? guaiFENesin  600 mg Oral BID  ?? heparin injection (subcutaneous)  5,000 Units Subcutaneous Q12H  ?? ipratropium-albuterol  3 mL Nebulization TID  ?? melatonin  10 mg Oral QHS  ?? predniSONE  30 mg Oral Q breakfast  ?? rOPINIRole  4 mg Oral QHS  ? ?Infusions:  ?? sodium chloride 20 mL/hr at 11/05/21 1314  ?? meropenem (MERREM) IV 1 g (11/09/21 0346)  ? ? ?Labs: ?Recent Labs  ?  11/08/21 ?0431  ?NA 137  ?K 3.9  ?CL 110  ?CO2 21*  ?GLUCOSE 98  ?BUN 31*  ?CREATININE 1.08*  ?CALCIUM 8.5*  ? ?No results for input(s): AST, ALT, ALKPHOS, BILITOT, PROT, ALBUMIN in the last 72 hours. ?Recent Labs  ?  11/08/21 ?0431  ?WBC 8.4  ?HGB 10.0*  ?HCT 32.6*  ?MCV 90.3  ?PLT 148*  ? ?No results for input(s): CKTOTAL, CKMB, TROPONINI in the last 72 hours. ?Invalid input(s): POCBNP ?No results for input(s): HGBA1C in the last 72 hours.  ? ?Weights: ?Filed Weights  ? 11/05/21 1210  ?Weight: 77.2 kg  ? ? ? ?Radiology/Studies:  ?DG Chest Port 1 View ? ?Result Date: 11/05/2021 ?CLINICAL DATA:  87 year old female with shortness of breath. EXAM: PORTABLE CHEST - 1 VIEW COMPARISON:  09/20/2021, 09/19/2021, 09/17/2021 FINDINGS: The mediastinal contours are within normal limits. Unchanged cardiomegaly. Unchanged position of left chest wall, subclavian vein approach dual lead pacemaker. Atherosclerotic calcification of the aortic arch. Similar appearing cephalization of pulmonary vasculature. Similar appearing diffuse coarse interstitial lung markings, most  prominent in the bilateral lung bases and right upper lobe. No evidence of new focal consolidation, pleural effusion, or pneumothorax. Bilateral first total shoulder arthroplasties, partially visualized. No acute osseous abnormality.  IMPRESSION: 1. Similar appearing cardiomegaly and changes of chronic lung disease. No new acute cardiopulmonary process. 2. Aortic Atherosclerosis (ICD10-I70.0). Electronically Signed   By: Ruthann Cancer M.D.   On: 11/05/2021 12:37  ? ?VAS [name] LE ART SEG MULTI (Segm&LE Reynauds) ? ?Result Date: 10/19/2021 ? LOWER EXTREMITY DOPPLER STUDY Patient Name:  Kristin [name]  Date of Exam:   10/15/2021 Medical Rec #: [id number]      Accession #:    [id number] Date of Birth: 03/12/1930       Patient Gender: F Patient Age:   55 years Exam Location:  [facility] Procedure:      VAS [name] LOWER EXT ART SEG MULTI (SEGMENTALS \\T \ LE RAYNAUDS) Referring Phys: GREGORY SCHNIER --------------------------------------------------------------------------------  Indications: Peripheral artery disease.  Vascular Interventions: 10/14/20: Left EIA sten with left SFA/popliteal/TP                         trunk/PTA angioplasties;                         09/08/21: Right SFA/popliteal thrombectomy/PTA;. Performing Technologist: Blondell Reveal RT, RDMS, RVT  Examination Guidelines: A complete evaluation includes at minimum, Doppler waveform signals and systolic blood pressure reading at the level of bilateral brachial, anterior tibial, and posterior tibial arteries, when vessel segments are accessible. Bilateral testing is considered an integral part of a complete examination. Photoelectric Plethysmograph (PPG) waveforms and toe systolic pressure readings are included as required and additional duplex testing as needed. Limited examinations for reoccurring indications may be performed as noted.  ABI Findings: +-------------+------------------+-----+---------------------------+--------+ Right        Rt Pressure (mmHg)IndexWaveform                   Comment  +-------------+------------------+-----+---------------------------+--------+ Brachial     84                                                          +-------------+------------------+-----+---------------------------+--------+ CFA                                 monophasic/hyperemic                +-------------+------------------+-----+---------------------------+--------+ SFA/Popliteal                       monophasic/mild hyperemia           +-------------+------------------+-----+---------------------------+--------+ ATA          146               1.62 monophasic/mildly hyperemic         +-------------+------------------+-----+---------------------------+--------+ PTA          111               1.23 monophasic/mildly hyperemic         +-------------+------------------+-----+---------------------------+--------+ PERO         140               1.56 monophasic/mildly hyperemic         +-------------+------------------+-----+---------------------------+--------+  Great Toe    46                0.51 Normal                              +-------------+------------------+-----+---------------------------+--------+ +-------------+------------------+-----+-------------------------+-------+ Left         Lt Pressure (mmHg)IndexWaveform                 Comment +-------------+------------------+-----+-------------------------+-------+ Brachial     90                                                      +-------------+------------------+-----+-------------------------+-------+ CFA                                 Triphasisc/mild hyperemia        +-------------+------------------+-----+-------------------------+-------+ SFA/Popliteal                       monophasic/mild hyperemia        +-------------+------------------+-----+-------------------------+-------+ ATA          132               1.47 monophasic                       +-------------+------------------+-----+-------------------------+-------+ PTA          119               1.32 monophasic                        +-------------+------------------+-----+-------------------------+-------+ Great Toe    44                0.49 Normal                           +-------------+------------------+-----+-------------------------+-------+ +-------+-----------+-----------+------------+------------+ ABI/TBIToday's ABIToday's TBIPrevious ABIPrevious TBI +-------+-----------+-----------+--

## 2021-11-10 ENCOUNTER — Inpatient Hospital Stay: Payer: Medicare HMO

## 2021-11-10 DIAGNOSIS — R7881 Bacteremia: Secondary | ICD-10-CM | POA: Diagnosis not present

## 2021-11-10 DIAGNOSIS — I480 Paroxysmal atrial fibrillation: Secondary | ICD-10-CM | POA: Diagnosis not present

## 2021-11-10 DIAGNOSIS — J9601 Acute respiratory failure with hypoxia: Secondary | ICD-10-CM | POA: Diagnosis not present

## 2021-11-10 DIAGNOSIS — J189 Pneumonia, unspecified organism: Secondary | ICD-10-CM | POA: Diagnosis not present

## 2021-11-10 LAB — CBC
HCT: 34.6 % — ABNORMAL LOW (ref 36.0–46.0)
Hemoglobin: 10.8 g/dL — ABNORMAL LOW (ref 12.0–15.0)
MCH: 27.6 pg (ref 26.0–34.0)
MCHC: 31.2 g/dL (ref 30.0–36.0)
MCV: 88.3 fL (ref 80.0–100.0)
Platelets: 200 10*3/uL (ref 150–400)
RBC: 3.92 MIL/uL (ref 3.87–5.11)
RDW: 15.5 % (ref 11.5–15.5)
WBC: 8.3 10*3/uL (ref 4.0–10.5)
nRBC: 0 % (ref 0.0–0.2)

## 2021-11-10 LAB — BASIC METABOLIC PANEL
Anion gap: 7 (ref 5–15)
BUN: 29 mg/dL — ABNORMAL HIGH (ref 8–23)
CO2: 27 mmol/L (ref 22–32)
Calcium: 8.9 mg/dL (ref 8.9–10.3)
Chloride: 104 mmol/L (ref 98–111)
Creatinine, Ser: 1.1 mg/dL — ABNORMAL HIGH (ref 0.44–1.00)
GFR, Estimated: 48 mL/min — ABNORMAL LOW (ref >60)
Glucose, Bld: 79 mg/dL (ref 70–99)
Potassium: 4.1 mmol/L (ref 3.5–5.1)
Sodium: 138 mmol/L (ref 135–145)

## 2021-11-10 MED ORDER — SODIUM CHLORIDE 0.9 % IV SOLN
1.0000 g | INTRAVENOUS | Status: DC
  Administered 2021-11-11 – 2021-11-12 (×2): 1000 mg via INTRAVENOUS
  Filled 2021-11-10 (×2): qty 1

## 2021-11-10 MED ORDER — ROPINIROLE HCL 1 MG PO TABS
2.0000 mg | ORAL_TABLET | Freq: Once | ORAL | Status: AC
  Administered 2021-11-10: 2 mg via ORAL
  Filled 2021-11-10: qty 2

## 2021-11-10 NOTE — Progress Notes (Signed)
Mobility Specialist - Progress Note ? ? 11/10/21 0900  ?Mobility  ?Activity Ambulated with assistance in room;Ambulated with assistance to bathroom  ?Range of Motion/Exercises Active  ?Level of Assistance Standby assist, set-up cues, supervision of patient - no hands on  ?Assistive Device Front wheel walker  ?Distance Ambulated (ft) 85 ft  ?Activity Response Tolerated well  ?$Mobility charge 1 Mobility  ? ? ? ?Pre-mobility: 62 HR, 98% SpO2 ?During mobility: 78 HR, 88-93% SpO2 ?Post-mobility: 65 HR, 95% SpO2 ? ? ?Pt lying in bed upon arrival, utilizing 2L. Pt ambulated in room with supervision, R lateral lean noted. Pt ambulated to bathroom for urinal out (unsure if pt had BM). Ambulated to sink for peri-hygiene, hand hygiene, face washing, and combing hair. SOB with exertion, PLB in use. Pt returned to bed with needs in reach.  ? ? ?Kathee Delton ?Mobility Specialist ?11/10/21, 11:55 AM ? ?

## 2021-11-10 NOTE — Assessment & Plan Note (Signed)
-  Continue IV meropenem per ID due to ESBL E. coli growing in the blood.  Repeat blood culture from 3/19 are negative thus far ?-Likely source pulmonary ?- transition to ertapenem at discharge via midline to complete 14 day course per ID ?

## 2021-11-10 NOTE — Assessment & Plan Note (Addendum)
-  Continue IV meropenem per ID due to ESBL E. coli growing in the blood.  Repeat blood culture from 3/19 are negative thus far ?-Likely source pulmonary ?- Pulmonary planning bronc tomorrow on Wednesday 3/22 ?- transition to ertapenem at discharge via midline to complete 14 day course per ID ?

## 2021-11-10 NOTE — Assessment & Plan Note (Addendum)
Continue Lasix 40 mg IV daily. ?Net IO Since Admission: -8,861.67 mL [11/10/21 1312]  ?She is on room air ?

## 2021-11-10 NOTE — Progress Notes (Signed)
? ? ? ?PULMONOLOGY ? ? ? ? ? ? ? ? ?Date: 11/10/2021,   ?MRN# [id number] Kristin [name] June 25, 1930 ? ? ?  ?AdmissionWeight: 77.2 kg                 ?CurrentWeight: 77.2 kg ? ? ?Referring physician: Dr Sidney Ace ? ? ?CHIEF COMPLAINT:  ? ?Lung mass and mediastinal lymphadenopathy ? ? ?HISTORY OF PRESENT ILLNESS  ? ?This is a pleasant 88 yr old F, she quit smoking 35 years ago per patient and is in good health not demented. We talked about her medical history which is listed belowe in [place] history.  She clearly states she wants everything done.  She has reviewed chest imaging with me and her wishes are to have full scope of therapy.  ? ?She has asked me to discuss these findings with her daughter [name] who is a Education officer, museum.  ? ?She does uses oxygen at home at 2L/min nasal canula and states this is chronic at least 2 years using mostly nocturnally and recently started using during daytime.  ? ?She is ambulatory and stable. ? ?11/10/21-  patient is stable on 1-2L/min [place] no overnight events. Bronch at 12pm tommorow ? ?PAST MEDICAL HISTORY  ? ?Past Medical History:  ?Diagnosis Date  ?? Acute postoperative pain 02/09/2018  ?? Arrhythmia, sinus node 04/03/2014  ?? Arthritis   ?? Arthritis   ?? Atrial fibrillation ([place])   ?? Back pain   ? lower back chronic  ?? Bradycardia   ?? Breast cancer ([place]) 2004  ? right breast cancer  ?? Cancer [facility]) 2004  ? rt breast cancer-post lumpectomy- chemo/rad  ?? CHF (congestive heart failure) ([place])   ?? Chronic back pain   ?? CKD (chronic kidney disease)   ?? COPD (chronic obstructive pulmonary disease) ([place])   ? wears O2 at 2L via  at night  ?? COVID-19 06/11/2020  ?? Cystocele   ?? Decubitus ulcers   ?? Dehydration   ?? Gout   ?? Hematuria   ?? Hepatitis C   ?? Hyperlipidemia   ?? Hypertension   ?? Hypomagnesemia 06/25/2015  ?? NSTEMI (non-ST elevated myocardial infarction) ([place]) 04/08/2021  ?? OAB (overactive bladder)   ?? Overactive bladder   ?? Overdose opiate, accidental or  unintentional, sequela 10/02/2020  ?? Restless leg   ?? Shoulder pain, bilateral   ?? Urinary frequency   ?? UTI (lower urinary tract infection)   ?? Vaginal atrophy   ? ? ? ?SURGICAL HISTORY  ? ?Past Surgical History:  ?Procedure Laterality Date  ?? ABDOMINAL HYSTERECTOMY    ?? BACK SURGERY    ?? BREAST LUMPECTOMY Right 2004  ?? BREAST SURGERY    ? rt lumpectomy  ?? CARPAL TUNNEL RELEASE    ?? CATARACT EXTRACTION W/PHACO  10/19/2011  ? Procedure: CATARACT EXTRACTION PHACO AND INTRAOCULAR LENS PLACEMENT (IOC);  Surgeon: Elta Guadeloupe T. Gershon Crane, MD;  Location: [facility];  Service: Ophthalmology;  Laterality: Right;  CDE:10.81  ?? CATARACT EXTRACTION W/PHACO  11/02/2011  ? Procedure: CATARACT EXTRACTION PHACO AND INTRAOCULAR LENS PLACEMENT (IOC);  Surgeon: Elta Guadeloupe T. Gershon Crane, MD;  Location: [facility];  Service: Ophthalmology;  Laterality: Left;  CDE 8.60  ?? CHOLECYSTECTOMY    ? 3/18  ?? COLON SURGERY    ?? INSERT / REPLACE / REMOVE PACEMAKER    ?? JOINT REPLACEMENT    ? bilateral TKA  ?? LOWER EXTREMITY ANGIOGRAPHY Left 10/14/2020  ? Procedure: LOWER EXTREMITY ANGIOGRAPHY;  Surgeon: Katha Cabal, MD;  Location: [facility];  Service: Cardiovascular;  Laterality: Left;  ?? LOWER EXTREMITY ANGIOGRAPHY Right 09/08/2021  ? Procedure: LOWER EXTREMITY ANGIOGRAPHY;  Surgeon: Katha Cabal, MD;  Location: [facility];  Service: Cardiovascular;  Laterality: Right;  ?? PACEMAKER INSERTION N/A 12/24/2015  ? Procedure: INSERTION PACEMAKER;  Surgeon: Isaias Cowman, MD;  Location: [facility];  Service: Cardiovascular;  Laterality: N/A;  ?? ROTATOR CUFF REPAIR    ? bilateral  ? ? ? ?FAMILY HISTORY  ? ?Family History  ?Problem Relation Age of Onset  ?? Kidney disease Brother   ?? Heart disease Father   ?? Heart disease Mother   ?? Stroke Mother   ?? Cancer Sister   ?? Breast cancer Sister 58  ?? Breast cancer Paternal Aunt   ?? Anesthesia problems Neg Hx   ?? Hypotension Neg Hx   ?? Malignant hyperthermia Neg Hx   ??  Pseudochol deficiency Neg Hx   ? ? ? ?SOCIAL HISTORY  ? ?Social History  ? ?Tobacco Use  ?? Smoking status: Former  ?  Packs/day: 1.25  ?  Years: 40.00  ?  Pack years: 50.00  ?  Types: Cigarettes  ?  Quit date: 12/16/1998  ?  Years since quitting: 22.9  ?? Smokeless tobacco: Never  ?Vaping Use  ?? Vaping Use: Never used  ?Substance Use Topics  ?? Alcohol use: No  ?  Alcohol/week: 0.0 standard drinks  ?? Drug use: Yes  ?  Types: Oxycodone  ? ? ? ?MEDICATIONS  ? ? ?Home Medication:  ? ?  ?Current Medication: ? ?Current Facility-Administered Medications:  ??  0.9 %  sodium chloride infusion, , Intravenous, Continuous, Menshew, Jenise V Bacon, PA-C, Last Rate: 20 mL/hr at 11/05/21 1314, New Bag at 11/05/21 1314 ??  acetaminophen (TYLENOL) tablet 650 mg, 650 mg, Oral, Q6H PRN, 650 mg at 11/09/21 1800 **OR** acetaminophen (TYLENOL) suppository 650 mg, 650 mg, Rectal, Q6H PRN, Mansy, Jan A, MD ??  albuterol (PROVENTIL) (2.5 MG/3ML) 0.083% nebulizer solution 2.5 mg, 2.5 mg, Nebulization, Q4H PRN, Max Sane, MD, 2.5 mg at 11/06/21 1320 ??  atorvastatin (LIPITOR) tablet 20 mg, 20 mg, Oral, Daily, Mansy, Jan A, MD, 20 mg at 11/09/21 0919 ??  bisacodyl (DULCOLAX) EC tablet 10 mg, 10 mg, Oral, Daily PRN, Mansy, Jan A, MD, 10 mg at 11/09/21 2012 ??  cetirizine (ZYRTEC) tablet 5 mg, 5 mg, Oral, Daily PRN, Mansy, Arvella Merles, MD ??  chlorpheniramine-HYDROcodone 10-8 MG/5ML suspension 5 mL, 5 mL, Oral, Q12H PRN, Mansy, Jan A, MD, 5 mL at 11/09/21 2050 ??  cholecalciferol (VITAMIN D3) tablet 2,000 Units, 2,000 Units, Oral, Daily, Mansy, Jan A, MD, 2,000 Units at 11/09/21 5170 ??  diclofenac Sodium (VOLTAREN) 1 % topical gel 4 g, 4 g, Topical, BID PRN, Mansy, Jan A, MD, 4 g at 11/08/21 2136 ??  furosemide (LASIX) injection 40 mg, 40 mg, Intravenous, Daily, Lanney Gins, Charmin Aguiniga, MD, 40 mg at 11/09/21 0174 ??  gabapentin (NEURONTIN) capsule 300 mg, 300 mg, Oral, TID, Lanney Gins, Bryler Dibble, MD, 300 mg at 11/09/21 2012 ??  guaiFENesin (MUCINEX) 12 hr  tablet 600 mg, 600 mg, Oral, BID, Mansy, Jan A, MD, 600 mg at 11/09/21 2013 ??  heparin injection 5,000 Units, 5,000 Units, Subcutaneous, Q12H, Ottie Glazier, MD, 5,000 Units at 11/09/21 0919 ??  ipratropium-albuterol (DUONEB) 0.5-2.5 (3) MG/3ML nebulizer solution 3 mL, 3 mL, Nebulization, TID, Max Sane, MD, 3 mL at 11/10/21 0747 ??  magnesium hydroxide (MILK OF MAGNESIA) suspension 30 mL, 30  mL, Oral, Daily PRN, Mansy, Arvella Merles, MD ??  melatonin tablet 10 mg, 10 mg, Oral, QHS, Mansy, Jan A, MD, 10 mg at 11/09/21 2012 ??  meropenem (MERREM) 1 g in sodium chloride 0.9 % 100 mL IVPB, 1 g, Intravenous, Q12H, Sharion Settler, NP, Last Rate: 200 mL/hr at 11/10/21 0341, 1 g at 11/10/21 0341 ??  metoCLOPramide (REGLAN) injection 10 mg, 10 mg, Intravenous, Q6H PRN, Mansy, Jan A, MD ??  [COMPLETED] methylPREDNISolone sodium succinate (SOLU-MEDROL) 125 mg/2 mL injection 80 mg, 80 mg, Intravenous, Q24H, 80 mg at 11/06/21 5997 **FOLLOWED BY** predniSONE (DELTASONE) tablet 30 mg, 30 mg, Oral, Q breakfast, Lanney Gins, Takeira Yanes, MD, 30 mg at 11/09/21 0918 ??  rOPINIRole (REQUIP) tablet 4 mg, 4 mg, Oral, QHS, Mansy, Jan A, MD, 4 mg at 11/09/21 2012 ??  traZODone (DESYREL) tablet 25 mg, 25 mg, Oral, QHS PRN, Mansy, Arvella Merles, MD ? ? ? ?ALLERGIES  ? ?Flexeril [cyclobenzaprine hcl] and Vancomycin ? ? ? ? ?REVIEW OF SYSTEMS  ? ? ?Review of Systems: ? ?Gen:  Denies  fever, sweats, chills weigh loss  ?HEENT: Denies blurred vision, double vision, ear pain, eye pain, hearing loss, nose bleeds, sore throat ?Cardiac:  No dizziness, chest pain or heaviness, chest tightness,edema ?Resp:   Denies cough or sputum porduction, shortness of breath,wheezing, hemoptysis,  ?Gi: Denies swallowing difficulty, stomach pain, nausea or vomiting, diarrhea, constipation, bowel incontinence ?Gu:  Denies bladder incontinence, burning urine ?Ext:   Denies Joint pain, stiffness or swelling ?Skin: Denies  skin rash, easy bruising or bleeding or hives ?Endoc:  Denies  polyuria, polydipsia , polyphagia or weight change ?Psych:   Denies depression, insomnia or hallucinations  ? ?Other:  All other systems negative ? ? ?VS: BP 108/75   Pulse (!) 59   Temp 98.9 ?F (37.2 ?C)   Resp 18   Ht 5'

## 2021-11-10 NOTE — Progress Notes (Signed)
?  Progress Note ? ? ?Patient: Kristin [name] HQR:[id number] DOB: [birth year] DOA: 11/05/2021     5 ?DOS: the patient was seen and examined on 11/10/2021 ?  ?Brief hospital course: ?88 y.o. female with medical history significant for CHF, COPD, hepatitis C, dyslipidemia, coronary artery disease, hypertension, dyslipidemia and paroxysmal atrial fibrillation on Eliquis, who presented to the ER with acute onset of dyspnea and hypoxemia at her PCPs office with pulse ox of 82% on room air ? ?3/17: Oncology and pulmonary consult for right upper lobe mass concerning for malignancy ?3/18: Reports worsening neuropathy pain in the leg so increase the dose of gabapentin.  ID started meropenem for ESBL E. coli growing in the blood.  ?3/19: Stopped IV fluid, repeating blood culture, restarting Lasix ?3/20: Repeat blood cultures from yesterday negative.  ?3/21: bronch tomorrow, ID recommends 14 days IV Abx (at D/C will need mid line) ? ? ?Assessment and Plan: ?Postobstructive pneumonia ?-Continue IV meropenem  ? ? ? ?Mass of upper lobe of right lung ?- Appreciate pulmonary and oncology input.  Bronch planned for 3/22 at 10 AM per pulmonary.  Cardiology cleared ? ?Sepsis due to pneumonia [facility]) ?-Continue IV meropenem per ID due to ESBL E. coli growing in the blood.  Repeat blood culture from 3/19 are negative thus far ?-Likely source pulmonary ?- Pulmonary planning bronc tomorrow on Wednesday 3/22 ?- transition to ertapenem at discharge via midline to complete 14 day course per ID ? ?Acute respiratory failure with hypoxia ([place]) ?Continue Lasix 40 mg IV daily. ?Net IO Since Admission: -8,861.67 mL [11/10/21 1312]  ?She is on room air ? ?Bacteremia ?-Continue IV meropenem per ID due to ESBL E. coli growing in the blood.  Repeat blood culture from 3/19 are negative thus far ?-Likely source pulmonary ?- transition to ertapenem at discharge via midline to complete 14 day course per ID ? ?Dyslipidemia ?- continue statin therapy ? ?Paroxysmal  atrial fibrillation ([facility]) ?Eliquis on hold for planned bronc on Wednesday ? ? ? ? ?  ? ?Subjective: No new complaints.  Her leg pain this morning gabapentin ? ?Physical Exam: ?Vitals:  ? 11/10/21 0536 11/10/21 0748 11/10/21 0837 11/10/21 1146  ?BP: 108/75  133/69 (!) 143/82  ?Pulse: (!) 59  64 60  ?Resp: 18  15 16   ?Temp: 98.9 ?F (37.2 ?C)  98.1 ?F (36.7 ?C) 98.3 ?F (36.8 ?C)  ?TempSrc:      ?SpO2: 98% 96% 98% 97%  ?Weight:      ?Height:      ? ?89 year old female lying in the bed comfortably without any acute distress ?Eyes pupil equal round reactive to light and accommodation, no scleral icterus. ?Extraocular muscles intact ?Lungs decreased breath sounds at the bases ?Cardiovascular S1-S2 normal, no murmur rales or gallop. ? ?Abdomen soft, benign ?Neuro awake, nonfocal ?Skin no rash or lesion ?Psych normal mood and affect ? ?Data Reviewed: ? ?There are no new results to review at this time. ? ?Family Communication: Updated granddaughter over phone ? ?Disposition: ?Status is: Inpatient ?Remains inpatient appropriate because: Getting bronchoscopy tomorrow.  She will need midline for IV antibiotics at home after that ? ? Planned Discharge Destination: Home with Home Health ? ? ? DVT prophylaxis-subcu heparin ?Time spent: 35 minutes ? ?Author: ?Max Sane, MD ?11/10/2021 1:16 PM ? ?For on call review [email or web address].  ?

## 2021-11-10 NOTE — Assessment & Plan Note (Signed)
Eliquis on hold for planned bronc on Wednesday ?

## 2021-11-10 NOTE — Progress Notes (Addendum)
Physical Therapy Treatment ?Patient Details ?Name: Kristin [name] ?MRN: [id number] ?DOB: Jan 25, 1930 ?Today's Date: 11/10/2021 ? ? ?History of Present Illness Pt is a 88 y/o F admitted on 11/05/21 after presenting with c/c of SOB. fever & chills. CT showed posterior right upper lobe lung mass, new, suspicious for primary bronchogenic carcinoma & two new indistinct peribronchovascular pulmonary nodules in the superior segment right lower lobe. Pt is being treated for postobstructive PNA. PMH: CHF, COPD, Hep C, dyslipidemia, CAD, HTN, paroxysmal a-fib on Eliquis, breast CA, CKD, NSTEMI ? ?  ?PT Comments  ? ? Patient agreeable to bed level LE exercises for strengthening. She declined ambulation due to just having ambulated. No shortness of breath is noted with bed level LE exercises. Recommend to continue PT to maximize independence. The patient is hopeful to discharge home when medically ready.  ?  ?Recommendations for follow up therapy are one component of a multi-disciplinary discharge planning process, led by the attending physician.  Recommendations may be updated based on patient status, additional functional criteria and insurance authorization. ? ?Follow Up Recommendations ? Home health PT ?  ?  ?Assistance Recommended at Discharge Intermittent Supervision/Assistance  ?Patient can return home with the following A little help with walking and/or transfers;A little help with bathing/dressing/bathroom;Assistance with cooking/housework;Assist for transportation;Direct supervision/assist for financial management;Direct supervision/assist for medications management ?  ?Equipment Recommendations ? Rolling walker (2 wheels)  ?  ?Recommendations for Other Services   ? ? ?  ?Precautions / Restrictions Precautions ?Precautions: Fall ?Restrictions ?Weight Bearing Restrictions: No  ?  ? ?Mobility ? Bed Mobility ?  ?  ?  ?  ?  ?  ?  ?General bed mobility comments: not attempted. patient declined out of bed mobility ?   ? ?Transfers ?  ?  ?  ?  ?  ?  ?  ?  ?  ?  ?  ? ?Ambulation/Gait ?  ?  ?  ?  ?  ?  ?  ?General Gait Details: patient declined walking as she reports just walking in the room not long ago ? ? ?Stairs ?  ?  ?  ?  ?  ? ? ?Wheelchair Mobility ?  ? ?Modified Rankin (Stroke Patients Only) ?  ? ? ?  ?Balance   ?  ?  ?  ?  ?  ?  ?  ?  ?  ?  ?  ?  ?  ?  ?  ?  ?  ?  ?  ? ?  ?Cognition Arousal/Alertness: Awake/alert ?Behavior During Therapy: [facility] for tasks assessed/performed ?Overall Cognitive Status: Within Functional Limits for tasks assessed ?  ?  ?  ?  ?  ?  ?  ?  ?  ?  ?  ?  ?  ?  ?  ?  ?General Comments: patient able to follow single step commands without difficulty. ?  ?  ? ?  ?Exercises General Exercises - Lower Extremity ?Ankle Circles/Pumps: AROM, Strengthening, Both, 10 reps, Supine ?Heel Slides: AAROM, Strengthening, Both, 10 reps, Supine ?Hip ABduction/ADduction: AAROM, Strengthening, Both, 10 reps, Supine ?Straight Leg Raises: AAROM, Strengthening, Both, 10 reps, Supine ?Other Exercises ?Other Exercises: verbal cues for technique for strengthening. ? ?  ?General Comments   ?  ?  ? ?Pertinent Vitals/Pain Pain Assessment ?Pain Assessment: No/denies pain  ? ? ?Home Living   ?  ?  ?  ?  ?  ?  ?  ?  ?  ?   ?  ?  Prior Function    ?  ?  ?   ? ?PT Goals (current goals can now be found in the care plan section) Acute Rehab PT Goals ?Patient Stated Goal: to return home ?PT Goal Formulation: With patient ?Time For Goal Achievement: 11/20/21 ?Potential to Achieve Goals: Good ?Progress towards PT goals: Progressing toward goals ? ?  ?Frequency ? ? ? Min 2X/week ? ? ? ?  ?PT Plan Current plan remains appropriate  ? ? ?Co-evaluation   ?  ?  ?  ?  ? ?  ?AM-PAC PT "6 Clicks" Mobility   ?Outcome Measure ? Help needed turning from your back to your side while in a flat bed without using bedrails?: None ?Help needed moving from lying on your back to sitting on the side of a flat bed without using bedrails?: A Little ?Help needed  moving to and from a bed to a chair (including a wheelchair)?: A Little ?Help needed standing up from a chair using your arms (e.g., wheelchair or bedside chair)?: None ?Help needed to walk in hospital room?: A Little ?Help needed climbing 3-5 steps with a railing? : A Little ?6 Click Score: 20 ? ?  ?End of Session   ?Activity Tolerance: Patient tolerated treatment well ?Patient left: in bed;with call bell/phone within reach;with bed alarm set ?Nurse Communication: Mobility status ?PT Visit Diagnosis: Muscle weakness (generalized) (M62.81) ?  ? ? ?Time: 0100-7121 ?PT Time Calculation (min) (ACUTE ONLY): 12 min ? ?Charges:  $Therapeutic Exercise: 8-22 mins          ?          ? ?Kristin [name], PT, MPT ? ? ? ?Kristin [name] ?11/10/2021, 12:34 PM ? ?

## 2021-11-10 NOTE — Progress Notes (Signed)
ID PROGRESS NOTE ? ?Patient remains afebrile. Being discharged home to complete 14 day course of abtx for ESBL e.coli bacteremia ? ?See opat below ? ?Diagnosis: ?bacteremia ? ?Culture Result: ESBL ecoli ? ?Allergies  ?Allergen Reactions  ? Flexeril [Cyclobenzaprine Hcl]   ?  Severe Rash  ? Vancomycin Rash  ?  [place] Syndrome  ? ? ?OPAT Orders ?Discharge antibiotics to be given via midline ?Discharge antibiotics: ?Per pharmacy protocol ertapenem 1gm IV daily ? ?Duration: ?To complete 14 d ?End Date: ?11/19/21 ? ?[facility] Care Per Protocol: ? ?Home health RN for IV administration and teaching; PICC line care and labs.   ? ?Labs weekly while on IV antibiotics: ?__x CBC with differential ?_x_ BMP ? ? ?x__ Please pull PIC at completion of IV antibiotics ? ? ?Fax weekly labs to [phone] ? ?Clinic Follow Up Appt: ?Telephone visit  ? ?@ [facility] ? ?

## 2021-11-10 NOTE — Assessment & Plan Note (Signed)
-   continue statin therapy. 

## 2021-11-10 NOTE — Assessment & Plan Note (Signed)
-   Appreciate pulmonary and oncology input.  Bronch planned for 3/22 at 10 AM per pulmonary.  Cardiology cleared ?

## 2021-11-10 NOTE — Assessment & Plan Note (Addendum)
-  Continue IV meropenem  ? ? ?

## 2021-11-11 ENCOUNTER — Encounter
Admission: EM | Disposition: A | Discharge status: Home Health | Payer: Self-pay | Source: Home / Self Care | Attending: Internal Medicine

## 2021-11-11 ENCOUNTER — Inpatient Hospital Stay: Payer: Medicare HMO

## 2021-11-11 ENCOUNTER — Encounter: Payer: Self-pay | Admitting: Family Medicine

## 2021-11-11 ENCOUNTER — Inpatient Hospital Stay: Payer: Medicare HMO | Admitting: Certified Registered"

## 2021-11-11 DIAGNOSIS — I5033 Acute on chronic diastolic (congestive) heart failure: Secondary | ICD-10-CM | POA: Diagnosis not present

## 2021-11-11 DIAGNOSIS — A4151 Sepsis due to Escherichia coli [E. coli]: Secondary | ICD-10-CM | POA: Diagnosis not present

## 2021-11-11 DIAGNOSIS — R6520 Severe sepsis without septic shock: Secondary | ICD-10-CM

## 2021-11-11 DIAGNOSIS — E663 Overweight: Secondary | ICD-10-CM | POA: Diagnosis present

## 2021-11-11 DIAGNOSIS — J9601 Acute respiratory failure with hypoxia: Secondary | ICD-10-CM | POA: Diagnosis not present

## 2021-11-11 HISTORY — PX: VIDEO BRONCHOSCOPY WITH ENDOBRONCHIAL NAVIGATION: SHX6175

## 2021-11-11 LAB — CBC
HCT: 35 % — ABNORMAL LOW (ref 36.0–46.0)
Hemoglobin: 10.9 g/dL — ABNORMAL LOW (ref 12.0–15.0)
MCH: 27.2 pg (ref 26.0–34.0)
MCHC: 31.1 g/dL (ref 30.0–36.0)
MCV: 87.3 fL (ref 80.0–100.0)
Platelets: 198 10*3/uL (ref 150–400)
RBC: 4.01 MIL/uL (ref 3.87–5.11)
RDW: 15.4 % (ref 11.5–15.5)
WBC: 9.8 10*3/uL (ref 4.0–10.5)
nRBC: 0 % (ref 0.0–0.2)

## 2021-11-11 LAB — BASIC METABOLIC PANEL
Anion gap: 5 (ref 5–15)
BUN: 27 mg/dL — ABNORMAL HIGH (ref 8–23)
CO2: 30 mmol/L (ref 22–32)
Calcium: 9 mg/dL (ref 8.9–10.3)
Chloride: 103 mmol/L (ref 98–111)
Creatinine, Ser: 0.92 mg/dL (ref 0.44–1.00)
GFR, Estimated: 59 mL/min — ABNORMAL LOW (ref >60)
Glucose, Bld: 86 mg/dL (ref 70–99)
Potassium: 4 mmol/L (ref 3.5–5.1)
Sodium: 138 mmol/L (ref 135–145)

## 2021-11-11 SURGERY — VIDEO BRONCHOSCOPY WITH ENDOBRONCHIAL NAVIGATION
Anesthesia: General | Laterality: Right

## 2021-11-11 MED ORDER — VASOPRESSIN 20 UNIT/ML IV SOLN
INTRAVENOUS | Status: AC
  Filled 2021-11-11: qty 1

## 2021-11-11 MED ORDER — OXYCODONE HCL 5 MG/5ML PO SOLN: 5.0000 mg | Freq: Once | ORAL | Status: DC | PRN

## 2021-11-11 MED ORDER — ONDANSETRON HCL 4 MG/2ML IJ SOLN
INTRAMUSCULAR | Status: AC
  Filled 2021-11-11: qty 2

## 2021-11-11 MED ORDER — PROPOFOL 10 MG/ML IV BOLUS
INTRAVENOUS | Status: AC
  Filled 2021-11-11: qty 20

## 2021-11-11 MED ORDER — FENTANYL CITRATE (PF) 100 MCG/2ML IJ SOLN
INTRAMUSCULAR | Status: AC
  Filled 2021-11-11: qty 2

## 2021-11-11 MED ORDER — LIDOCAINE HCL (CARDIAC) PF 100 MG/5ML IV SOSY
PREFILLED_SYRINGE | INTRAVENOUS | Status: DC | PRN
Start: 2021-11-11 — End: 2021-11-11
  Administered 2021-11-11: 70 mg via INTRAVENOUS
  Administered 2021-11-11: 30 mg via INTRAVENOUS

## 2021-11-11 MED ORDER — PROPOFOL 10 MG/ML IV BOLUS
INTRAVENOUS | Status: DC | PRN
  Administered 2021-11-11: 100 mg via INTRAVENOUS

## 2021-11-11 MED ORDER — ACETAMINOPHEN 10 MG/ML IV SOLN: 1000.0000 mg | Freq: Once | INTRAVENOUS | Status: DC | PRN

## 2021-11-11 MED ORDER — SUGAMMADEX SODIUM 200 MG/2ML IV SOLN
INTRAVENOUS | Status: DC | PRN
  Administered 2021-11-11: 150 mg via INTRAVENOUS

## 2021-11-11 MED ORDER — DEXAMETHASONE SODIUM PHOSPHATE 10 MG/ML IJ SOLN
INTRAMUSCULAR | Status: DC | PRN
  Administered 2021-11-11: 5 mg via INTRAVENOUS

## 2021-11-11 MED ORDER — FENTANYL CITRATE (PF) 100 MCG/2ML IJ SOLN: 25.0000 ug | INTRAMUSCULAR | Status: DC | PRN

## 2021-11-11 MED ORDER — ADULT MULTIVITAMIN W/MINERALS CH
1.0000 | ORAL_TABLET | Freq: Every day | ORAL | Status: DC
  Administered 2021-11-12: 1 via ORAL
  Filled 2021-11-11: qty 1

## 2021-11-11 MED ORDER — ROCURONIUM BROMIDE 100 MG/10ML IV SOLN
INTRAVENOUS | Status: DC | PRN
  Administered 2021-11-11: 40 mg via INTRAVENOUS
  Administered 2021-11-11: 20 mg via INTRAVENOUS
  Administered 2021-11-11: 30 mg via INTRAVENOUS
  Administered 2021-11-11: 10 mg via INTRAVENOUS

## 2021-11-11 MED ORDER — PHENYLEPHRINE HCL-NACL 20-0.9 MG/250ML-% IV SOLN
INTRAVENOUS | Status: DC | PRN
  Administered 2021-11-11: 25 ug/min via INTRAVENOUS

## 2021-11-11 MED ORDER — FENTANYL CITRATE (PF) 100 MCG/2ML IJ SOLN
INTRAMUSCULAR | Status: DC | PRN
Start: 2021-11-11 — End: 2021-11-11
  Administered 2021-11-11: 50 ug via INTRAVENOUS
  Administered 2021-11-11 (×2): 25 ug via INTRAVENOUS

## 2021-11-11 MED ORDER — ONDANSETRON HCL 4 MG/2ML IJ SOLN
INTRAMUSCULAR | Status: DC | PRN
  Administered 2021-11-11: 4 mg via INTRAVENOUS

## 2021-11-11 MED ORDER — LIDOCAINE HCL URETHRAL/MUCOSAL 2 % EX GEL
1.0000 "application " | Freq: Once | CUTANEOUS | Status: DC
  Filled 2021-11-11: qty 5

## 2021-11-11 MED ORDER — LACTATED RINGERS IV SOLN: INTRAVENOUS | Status: DC

## 2021-11-11 MED ORDER — EPHEDRINE 5 MG/ML INJ
INTRAVENOUS | Status: AC
  Filled 2021-11-11: qty 5

## 2021-11-11 MED ORDER — DEXMEDETOMIDINE (PRECEDEX) IN NS 20 MCG/5ML (4 MCG/ML) IV SYRINGE
PREFILLED_SYRINGE | INTRAVENOUS | Status: DC | PRN
  Administered 2021-11-11: 4 ug via INTRAVENOUS

## 2021-11-11 MED ORDER — DEXAMETHASONE SODIUM PHOSPHATE 10 MG/ML IJ SOLN
INTRAMUSCULAR | Status: AC
  Filled 2021-11-11: qty 1

## 2021-11-11 MED ORDER — ROCURONIUM BROMIDE 10 MG/ML (PF) SYRINGE
PREFILLED_SYRINGE | INTRAVENOUS | Status: AC
  Filled 2021-11-11: qty 10

## 2021-11-11 MED ORDER — OXYCODONE HCL 5 MG PO TABS: 5.0000 mg | ORAL_TABLET | Freq: Once | ORAL | Status: DC | PRN

## 2021-11-11 MED ORDER — DEXMEDETOMIDINE HCL IN NACL 200 MCG/50ML IV SOLN
INTRAVENOUS | Status: AC
  Filled 2021-11-11: qty 50

## 2021-11-11 MED ORDER — BUTAMBEN-TETRACAINE-BENZOCAINE 2-2-14 % EX AERO
1.0000 | INHALATION_SPRAY | Freq: Once | CUTANEOUS | Status: DC
  Filled 2021-11-11: qty 20

## 2021-11-11 MED ORDER — EPHEDRINE SULFATE (PRESSORS) 50 MG/ML IJ SOLN
INTRAMUSCULAR | Status: DC | PRN
  Administered 2021-11-11: 5 mg via INTRAVENOUS
  Administered 2021-11-11: 10 mg via INTRAVENOUS

## 2021-11-11 MED ORDER — PHENYLEPHRINE HCL 0.25 % NA SOLN
1.0000 | Freq: Four times a day (QID) | NASAL | Status: DC | PRN
  Filled 2021-11-11: qty 15

## 2021-11-11 MED ORDER — LIDOCAINE HCL 1 % IJ SOLN
30.0000 mL | Freq: Once | INTRAMUSCULAR | Status: DC
  Filled 2021-11-11: qty 30

## 2021-11-11 MED ORDER — LIDOCAINE HCL (PF) 2 % IJ SOLN
INTRAMUSCULAR | Status: AC
  Filled 2021-11-11: qty 5

## 2021-11-11 MED ORDER — ENSURE ENLIVE PO LIQD
237.0000 mL | Freq: Two times a day (BID) | ORAL | Status: DC
  Administered 2021-11-12 (×2): 237 mL via ORAL

## 2021-11-11 MED ORDER — PHENYLEPHRINE HCL (PRESSORS) 10 MG/ML IV SOLN
INTRAVENOUS | Status: DC | PRN
  Administered 2021-11-11: 80 ug via INTRAVENOUS
  Administered 2021-11-11: 160 ug via INTRAVENOUS
  Administered 2021-11-11: 80 ug via INTRAVENOUS
  Administered 2021-11-11: 160 ug via INTRAVENOUS

## 2021-11-11 NOTE — Transfer of Care (Signed)
Immediate Anesthesia Transfer of Care Note ? ?Patient: PRACHI [name] ? ?Procedure(s) Performed: VIDEO BRONCHOSCOPY WITH ENDOBRONCHIAL NAVIGATION (Right) ? ?Patient Location: PACU ? ?Anesthesia Type:General ? ?Level of Consciousness: drowsy and patient cooperative ? ?Airway & Oxygen Therapy: Patient Spontanous Breathing and Patient connected to face mask oxygen ? ?Post-op Assessment: Report given to RN and Post -op Vital signs reviewed and stable ? ?Post vital signs: Reviewed and stable ? ?Last Vitals:  ?Vitals Value Taken Time  ?BP 100/67 11/11/21 1535  ?Temp    ?Pulse 70 11/11/21 1542  ?Resp 14 11/11/21 1542  ?SpO2 95 % 11/11/21 1542  ?Vitals shown include unvalidated device data. ? ?Last Pain:  ?Vitals:  ? 11/11/21 1216  ?TempSrc: Oral  ?PainSc: 0-No pain  ?   ? ?Patients Stated Pain Goal: 0 (11/06/21 2000) ? ?Complications: No notable events documented. ?

## 2021-11-11 NOTE — Procedures (Signed)
ELECTROMAGNETIC NAVIGATIONAL BRONCHOSCOPY  ? ?FIBEROPTIC BRONCHOSCOPY WITH THERAPEUTIC ASPIRATION OF TRACHEOBRONCHIAL TREE  ? ?ENDOBRONCHIAL ULTRASOUND  ? ?TRANSBRONCHIAL BIOPSY OF RIGHT UPPER LOBE.  ? ?Flexible bronchoscopy was performed  by : Lanney Gins MD ? ?assistance by : 1)Repiratory therapist  and 2)[employer] staff and 3) Anesthesia team and 4) Flouroscopy team and 5) [facility] supporting staff ? ? ?Indication for the procedure was : ? ?Pre-procedural H&P. ?The following assessment was performed on the day of the procedure prior to initiating sedation ?History:  ?Chest pain n ?Dyspnea y ?Hemoptysis n ?Cough y ?Fever n ?Other pertinent items n ? ?Examination ?Vital signs -reviewed as per nursing documentation today ?Cardiac    Murmurs: n ? Rubs : n ? Gallop: n ?Lungs Wheezing: n ?Rales : n ?Rhonchi :y ? ?Other pertinent findings: SOB/hypoxemia due to chronic lung disease  ? ?Pre-procedural assessment for Procedural Sedation included: ?Depth of sedation: As per anesthesia team  ?ASA Classification:  2 ?Mallampati airway assessment: 3 ? ? ? ?Medication list reviewed: y ? ?The patient?s interval history was taken and revealed: no new complaints ?The pre- procedure physical examination revealed: No new findings ?Refer to prior clinic note for details. ? ?Informed Consent: ?Informed consent was obtained from:  patient after explanation of procedure and risks, benefits, as well as alternative procedures available.  Explanation of level of sedation and possible transfusion was also provided.  ?  ?Procedural Preparation: ?Time out was performed and patient was identified by name and birthdate and procedure to be performed and side for sampling, if any, was specified. ?Pt was intubated by anesthesia. ? ?The patient was appropriately draped. ? ? ?Fiberoptic bronchoscopy with airway inspection and therapeutic aspiration of tracheobronchial tree: ? ?Bronchoscope was inserted via ETT  without difficulty.  Posterior  oropharynx, epiglottis, arytenoids, false cords and vocal cords were not visualized as these were bypassed by endotracheal tube. The distal trachea was normal in circumference and appearance without mucosal, cartilaginous or branching abnormalities.  The main carina was mildly splayed . ?All right and left lobar airways were visualized to the Subsegmental level.  Sub- sub segmental carinae were identified in all the distal airways.   ?Secretions were visible in the following airways and appeared to be clear.  ?The mucosa was : friable at RUL ? ?Airways were notable for:  ?      exophytic lesions :n ?      extrinsic compression in the following distributions: n. ?      Friable mucosa: y ?      Anthrocotic material /pigmentation: n ?  ? ? ?Post procedure Diagnosis:   patient had multiple mucus plugged airways which were aspirated and washed out  ? ? ? ? ?Electromagnetic Navigational Bronchoscopy Procedure Findings:  ?After appropriate CT-guided planning ENB scope was advanced via endotracheal tube and LG was advanced for registration.  Post appropriate planning and registration peripheral navigation was used to visualize target lesion.   ? ?-cytobrush x 3 right upper lobe  ?-surgical biopsy x 4 right upper lobe ?-transbronchial biopsy of right upper lobe x 3 performed with 21G Olympus needle under EBUS guidance  ? ? ?Post procedure diagnosis: small cell lung cancer ? ? ? ? ? ?Endobronchial ultrasound assisted hilar and mediastinal lymph node biopsies procedure findings: ?The fiberoptic bronchoscope was removed and the EBUS scope was introduced. Examination began to evaluate for pathologically enlarged lymph nodes starting on the left  side progressing to the right side.  All lymph node biopsies performed with 21G needle.  Lymph node biopsies were sent in cytolite for all stations. ? ? ?Post procedure diagnosis:  small cell lung cancer ? ? ?Specimens obtained included: ?                  ?Cytology brushes : right upper  lobe  ? ?                             ?Transbronchial biopsy site: right upper lobe; sent for cytology    ?   ? ?Fluoroscopy Used: yes ;        Pictorial documentation attached: yes   ? ? ?Immediate sampling complications included:none  ?Epinephrine zero ml was used topically ? ?The bronchoscopy was terminated due to completion of the planned procedure and the bronchoscope was removed.  ? ?Total dosage of Lidocaine was zero mg ?Total fluoroscopy time was 2.4  minutes ? ? ?Estimated Blood loss: expected 5-10cc. ? ?Complications included: ? None immediate  ? ?Preliminary CXR findings :  ?Ordered in process  ? ?Disposition: inpatient completing treatment for ESBL bacteremia ? ?Follow up with Dr. Lanney Gins in 5 days for result discussion. ? ? ? ? ?Ottie Glazier MD  ?[place] ?Division of Pulmonary & Critical Care Medicine ? ? ?

## 2021-11-11 NOTE — Assessment & Plan Note (Addendum)
Patient met criteria for severe E. coli sepsis on admission due to patient met criteria for severe sepsis on admission given fever, heart rate greater than 90 with pneumonia source, blood cultures growing out E. coli, hypotension and acute respiratory failure with hypoxia.  Continue IV meropenem per infectious disease for total of 14 days (transition to ertapenem on discharge by midline).  Sepsis now stabilized.  ?

## 2021-11-11 NOTE — Progress Notes (Signed)
[facility] Progress Note ? ?Patient: Kristin [name]    IPJ:[id number]  DOA: 11/05/2021    ?Date of Service: the patient was seen and examined on 11/11/2021 ? ?Brief hospital course: ?88 y.o. female with medical history significant for CHF, COPD, hepatitis C, dyslipidemia, coronary artery disease, hypertension, dyslipidemia and paroxysmal atrial fibrillation on Eliquis, who presented to the ER on 3/16 with acute onset of dyspnea and hypoxemia at her PCPs office with pulse ox of 82% on room air and patient found to have right upper lobe mass concerning for malignancy.  Also found to have severe sepsis felt to be from pneumonia as well as acute diastolic heart failure.  Oncology and pulmonary consulted.  Blood cultures noteworthy for ESBL E. coli and infectious disease consulted and patient started on meropenem, with patient needing 14 days of IV antibiotics.  Pulmonary took patient for bronchoscopy which preliminary appeared to be small cell lung carcinoma. ? ?Assessment and Plan: ?Assessment and Plan: ?* Sepsis due to Escherichia coli (E. coli) ([place]) ?Patient met criteria for severe E. coli sepsis on admission due to patient met criteria for severe sepsis on admission given fever, heart rate greater than 90 with pneumonia source, blood cultures growing out E. coli, hypotension and acute respiratory failure with hypoxia.  Continue IV meropenem per infectious disease for total of 14 days (transition to ertapenem on discharge by midline).  Sepsis now stabilized.  ? ?Mass of upper lobe of right lung ?- Appreciate pulmonary and oncology input.  Status post bronchoscopy with pathology pending although appears to be small cell lung carcinoma.  Oncology consulted. ? ?Postobstructive pneumonia ?-Continue IV meropenem  ? ? ? ?Acute on chronic diastolic CHF (congestive heart failure) ([place]) ?Continue Lasix.  To date, patient has diuresed over 14 L and is -12 L deficient.  Echocardiogram from 10/21 notes grade 1 diastolic  dysfunction with preserved ejection fraction ? ?Acute respiratory failure with hypoxia ([place]) ?Secondary to pneumonia, lung mass and acute on chronic diastolic heart failure.  Continue Lasix 40 mg IV daily as well as antibiotics. ?Net IO Since Admission: -12,336.67 mL [11/11/21 0905]  ?Currently on 2 L nasal cannula ? ?Dyslipidemia ?- continue statin therapy ? ?Paroxysmal atrial fibrillation ([facility]) ?Eliquis on hold for planned bronc on Wednesday. ? ?Overweight (BMI 25.0-29.9) ?Meets criteria with BMI greater than 25 ? ?Bacteremia ?-Continue IV meropenem per ID due to ESBL E. coli growing in the blood.  Repeat blood culture from 3/19 are negative thus far ?-Likely source pulmonary ?- transition to ertapenem at discharge via midline to complete 14 day course per ID ? ? ? ? ? ? ?Body mass index is 24.75 kg/m?Marland Kitchen  ?Nutrition Problem: Increased nutrient needs ?Etiology: acute illness ?   ? ?Consultants: ?Oncology ?Infectious disease ?Pulmonary ? ?Procedures: ?Status post bronchoscopy done 3/22 ? ?Antimicrobials: ?IV meropenem 3/17-present ? ?Code Status: Full code ? ? ?Subjective: Patient currently resting comfortably ? ?Objective: ?Vital signs were reviewed and unremarkable. ?Vitals:  ? 11/11/21 1615 11/11/21 1648  ?BP: (!) 110/58 99/60  ?Pulse: 72 74  ?Resp: 12 16  ?Temp: (!) 97 ?F (36.1 ?C) 98.3 ?F (36.8 ?C)  ?SpO2: 92% 99%  ? ? ?Intake/Output Summary (Last 24 hours) at 11/11/2021 1723 ?Last data filed at 11/11/2021 1645 ?Gross per 24 hour  ?Intake 1274.67 ml  ?Output 4160 ml  ?Net -2885.33 ml  ? ?Filed Weights  ? 11/05/21 1210 11/11/21 1216  ?Weight: 77.2 kg 71.7 kg  ? ?Body mass index is 24.75 kg/m?. ? ?Exam: ? ?  General: Resting comfortably ?HEENT: Normocephalic, atraumatic, mucous membranes slightly dry ?Cardiovascular: Regular rate and rhythm, S1-S2, 2 out of 6 systolic ejection murmur ?Respiratory: Decreased breath sounds bibasilar ?Abdomen: Soft, nontender, nondistended, positive bowel sounds ?Musculoskeletal: No  clubbing or cyanosis, trace pitting edema ?Skin: No skin breaks, tears or lesions ?Psychiatry: Appropriate, no evidence of psychoses ?Neurology: No focal deficits ? ?Data Reviewed: ?Labs from this morning normal.  Pathology pending. ? ?Disposition:  ?Status is: Inpatient ?Remains inpatient appropriate because: Clarification of bronchoscopy results, pneumonia treatment ?  ? ?Anticipated discharge date: Unknown, no earlier than 325 ? ?Remaining issues to be resolved so that patient can be discharged: Plan per oncology following pathology results ? ? ?Family Communication: Spoke with husband prior to bronchoscopy. ?DVT Prophylaxis: ?  SCDs ? ? ? ?Author: ?Annita Brod ,MD ?11/11/2021 5:23 PM ? ?To reach On-call, see care teams to locate the attending and reach out via [email or web address]. ?Between 7PM-7AM, please contact night-coverage ?If you still have difficulty reaching the attending provider, please page the [facility] (Director on Call) for [facility] on amion for assistance. ? ?

## 2021-11-11 NOTE — Assessment & Plan Note (Signed)
-   continue statin therapy. 

## 2021-11-11 NOTE — Assessment & Plan Note (Addendum)
Continue Lasix.  To date, patient has diuresed over 17 L and is -14 L deficient.  Echocardiogram from 10/21 notes grade 1 diastolic dysfunction with preserved ejection fraction ?

## 2021-11-11 NOTE — Anesthesia Postprocedure Evaluation (Signed)
Anesthesia Post Note ? ?Patient: Kristin [name] ? ?Procedure(s) Performed: VIDEO BRONCHOSCOPY WITH ENDOBRONCHIAL NAVIGATION (Right) ? ?Patient location during evaluation: PACU ?Anesthesia Type: General ?Level of consciousness: awake and alert ?Pain management: pain level controlled ?Vital Signs Assessment: post-procedure vital signs reviewed and stable ?Respiratory status: spontaneous breathing, nonlabored ventilation, respiratory function stable and patient connected to nasal cannula oxygen ?Cardiovascular status: blood pressure returned to baseline and stable ?Postop Assessment: no apparent nausea or vomiting ?Anesthetic complications: no ? ? ?No notable events documented. ? ? ?Last Vitals:  ?Vitals:  ? 11/11/21 1615 11/11/21 1648  ?BP: (!) 110/58 99/60  ?Pulse: 72 74  ?Resp: 12 16  ?Temp: (!) 36.1 ?C 36.8 ?C  ?SpO2: 92% 99%  ?  ?Last Pain:  ?Vitals:  ? 11/11/21 1648  ?TempSrc: Oral  ?PainSc:   ? ? ?  ?  ?  ?  ?  ?  ? ?Precious Haws Derel Mcglasson ? ? ? ? ?

## 2021-11-11 NOTE — Progress Notes (Signed)
PHARMACY CONSULT NOTE FOR: ? ?OUTPATIENT  PARENTERAL ANTIBIOTIC THERAPY (OPAT) ? ?Indication: ESBL E coli bacteremia ?Regimen: Ertapenem 1gm IV q24h ?End date: 11/19/2021 ? ?IV antibiotic discharge orders are pended. ?To discharging provider:  please sign these orders via discharge navigator,  ?Select New Orders & click on the button choice - Manage This Unsigned Work.  ?  ? ?Thank you for allowing pharmacy to be a part of this patient's care. ? ?Doreene Eland, PharmD, BCPS, BCIDP ?Work Cell: [phone] ?11/11/2021 8:39 AM ? ? ? ?

## 2021-11-11 NOTE — Anesthesia Preprocedure Evaluation (Addendum)
Anesthesia Evaluation  ?Patient identified by MRN, date of birth, ID band ?Patient awake ? ? ? ?Reviewed: ?Allergy & Precautions, NPO status , Patient's Chart, lab work & pertinent test results ? ?History of Anesthesia Complications ?Negative for: history of anesthetic complications ? ?Airway ?Mallampati: III ? ? ?Neck ROM: Full ? ? ? Dental ? ?(+) Edentulous Upper, Edentulous Lower ?  ?Pulmonary ?pneumonia (causing sepsis; with acute hypoxic respiratory failure this admission; currently on supplemental O2 via [place]), COPD (on home O2), former smoker (quit 2000),  ?  ? ?+ decreased breath sounds ? ? ? ? ? Cardiovascular ?hypertension, + CAD (s/p MI), + Peripheral Vascular Disease (s/p LE stents) and +CHF  ?+ dysrhythmias (a fib on Eliquis) + pacemaker (bradycardia)  ?Rhythm:Irregular Rate:Normal ? ?ECG 11/05/21:  ?Sinus rhythm ?Atrial premature complex ?Nonspecific IVCD with LAD ?LVH with secondary repolarization abnormality ?Inferior infarct, acute (RCA) ?Probable anterolateral infarct, recent ?Probable RV involvement, suggest recording right precordial leads ?  ?Neuro/Psych ?negative neurological ROS ?   ? GI/Hepatic ?negative GI ROS,   ?Endo/Other  ?negative endocrine ROS ? Renal/GU ?Renal disease (stage III CKD)  ? ?  ?Musculoskeletal ? ?(+) Arthritis ,  ? Abdominal ?  ?Peds ? Hematology ?Breast CA   ?Anesthesia Other Findings ?Cardiology note 10/05/21:  ?88 year old female with chronic diastolic congestive heart failure, CKD stage IIIb, paroxysmal atrial fibrillation on low-dose Eliquis, sick sinus syndrome, status post dual-chamber pacemaker. 2D echocardiogram 05/24/2020 revealed LVEF of 60-65% with mild valvular insufficiencies. The patient was admitted to [facility] for relative hypotension secondary to overdiuresis; lisinopril and furosemide were held. The patient recently underwent scheduled right lower extremity angiography on 09/08/2021 with stent placement. She has lymphedema and  Unna wraps were placed, closely followed by vascular surgery. The patient was recently admitted for acute on chronic diastolic CHF with good diuresis with IV Lasix. She was discharged on Lasix 40 mg daily with stabilization of symptoms. ? ?Plan  ? ?1. Continue current medications including Lasix 40 mg daily ?2. Counseled patient about low-sodium diet ?3. DASH diet printed instructions given to the patient ?4. Continue low dose Eliquis for stroke prevention ?5. Pacemaker interrogation as scheduled this month ?6. Advised patient to keep her feet elevated when sitting ?7. Follow up Heart Failure Clinic in April ?8. Continue daily weights and call HF clinic for overnight weight gain of 2 or more pounds ?9. Return to clinic for follow-up in 3 months ? ? Reproductive/Obstetrics ? ?  ? ? ? ? ? ? ? ? ? ? ? ? ? ?  ?  ? ? ? ? ? ? ? ?Anesthesia Physical ?Anesthesia Plan ? ?ASA: 4 ? ?Anesthesia Plan: General  ? ?Post-op Pain Management:   ? ?Induction: Intravenous ? ?PONV Risk Score and Plan: 3 and Ondansetron, Dexamethasone and Treatment may vary due to age or medical condition ? ?Airway Management Planned: Oral ETT ? ?Additional Equipment:  ? ?Intra-op Plan:  ? ?Post-operative Plan: Extubation in OR ? ?Informed Consent: I have reviewed the patients History and Physical, chart, labs and discussed the procedure including the risks, benefits and alternatives for the proposed anesthesia with the patient or authorized representative who has indicated his/her understanding and acceptance.  ? ? ? ?Dental advisory given ? ?Plan Discussed with: CRNA ? ?Anesthesia Plan Comments: (Patient consented for risks of anesthesia including but not limited to:  ?- adverse reactions to medications ?- damage to eyes, teeth, lips or other oral mucosa ?- nerve damage due to positioning  ?- sore throat  or hoarseness ?- damage to heart, brain, nerves, lungs, other parts of body or loss of life ? ?Informed patient about role of CRNA in peri- and  intra-operative care.  Patient voiced understanding.)  ? ? ? ? ? ? ?Anesthesia Quick Evaluation ? ?

## 2021-11-11 NOTE — Anesthesia Procedure Notes (Signed)
Procedure Name: Intubation ?Date/Time: 11/11/2021 1:15 PM ?Performed by: Daryel Gerald, RN ?Pre-anesthesia Checklist: Patient identified, Emergency Drugs available, Suction available and Patient being monitored ?Patient Re-evaluated:Patient Re-evaluated prior to induction ?Oxygen Delivery Method: Circle system utilized ?Preoxygenation: Pre-oxygenation with 100% oxygen ?Induction Type: IV induction ?Ventilation: Mask ventilation without difficulty ?Tube type: Oral ?Tube size: 8.0 mm ?Number of attempts: 1 ?Airway Equipment and Method: Stylet and Oral airway ?Placement Confirmation: ETT inserted through vocal cords under direct vision, positive ETCO2 and breath sounds checked- equal and bilateral ?Secured at: 21 cm ?Tube secured with: Tape ?Dental Injury: Teeth and Oropharynx as per pre-operative assessment  ? ? ? ? ?

## 2021-11-11 NOTE — Progress Notes (Signed)
Nutrition Follow-up ? ?DOCUMENTATION CODES:  ? ?Not applicable ? ?INTERVENTION:  ? ?-Once diet is advanced, add:  ? ?-MVI with minerals daily ?-Ensure Enlive po BID, each supplement provides 350 kcal and 20 grams of protein.  ? ?NUTRITION DIAGNOSIS:  ? ?Increased nutrient needs related to acute illness as evidenced by estimated needs. ? ?Ongoing ? ?GOAL:  ? ?Patient will meet greater than or equal to 90% of their needs ? ?Progressing  ? ?MONITOR:  ? ?PO intake, Supplement acceptance, Labs, Weight trends, I & O's, Skin ? ?REASON FOR ASSESSMENT:  ? ?Consult ?Assessment of nutrition requirement/status ? ?ASSESSMENT:  ? ?Pt admitted from home with SOB and fever secondary to postobstructive pneumonia. PMH significant for CHF, COPD, hepatitis C, dyslipidemia, CAD, HTN, and afib on Eliquis. ? ?Reviewed I/O's: -3.1 L x 24 hours and -12 L since admission ? ?UOP: 3.1 L x 24 hours  ? ?Pt currently NPO for bronch today.  ? ?Spoke with pt at bedside, who was pleasant and in good spirits today. She reports feeling better and is awaiting bronchoscopy (pt currently NPO for procedure). Her appetite has improved; per pt, she has consumed most of her meals and is looking forward to eating dinner tonight. Meal completions 100%. She shares that she is not receiving Ensure. Discussed importance of good meal and supplement intake to promote healing.  ? ?Per ID notes, pt will require 14 day course of IV antibiotics.  ? ?Medications reviewed and include vitamin D3, melatonin, and 0.9% sodium chloride infusion @ 10 ml/hr.  ? ?Labs reviewed. ? ?Diet Order:   ?Diet Order   ? ?       ?  Diet NPO time specified  Diet effective midnight       ?  ? ?  ?  ? ?  ? ? ?EDUCATION NEEDS:  ? ?Education needs have been addressed ? ?Skin:  Skin Assessment: Skin Integrity Issues: ?Skin Integrity Issues:: Diabetic Ulcer ?Diabetic Ulcer: L ankle (open) ? ?Last BM:  11/07/21 ? ?Height:  ? ?Ht Readings from Last 1 Encounters:  ?11/05/21 5\' 7"  (1.702 m)   ? ? ?Weight:  ? ?Wt Readings from Last 1 Encounters:  ?11/05/21 77.2 kg  ? ?BMI:  Body mass index is 26.67 kg/m?. ? ?Estimated Nutritional Needs:  ? ?Kcal:  1600-1800 ? ?Protein:  80-95g ? ?Fluid:  >/=1.6L ? ? ? ?Loistine Chance, RD, LDN, CDCES ?Registered Dietitian II ?Certified Diabetes Care and Education Specialist ?Please refer to [facility] for RD and/or RD on-call/weekend/after hours pager  ?

## 2021-11-11 NOTE — Assessment & Plan Note (Addendum)
Remained rate controlled.  Eliquis initially put on hold for bronch.  Resumed on discharge. ?

## 2021-11-11 NOTE — Progress Notes (Signed)
PT Cancellation Note ? ?Patient Details ?Name: Kristin [name] ?MRN: [id number] ?DOB: 1930/07/07 ? ? ?Cancelled Treatment:     PT attempt, Pt off floor having procedure. PT will continue to follow and progress as able per current POC.  ? ?Willette Pa ?11/11/2021, 12:19 PM ?

## 2021-11-11 NOTE — Progress Notes (Signed)
Occupational Therapy Treatment ?Patient Details ?Name: Kristin [name] ?MRN: [id number] ?DOB: [birth year] ?Today's Date: 11/11/2021 ? ? ?History of present illness Pt is a 88 y/o F admitted on 11/05/21 after presenting with c/c of SOB. fever & chills. CT showed posterior right upper lobe lung mass, new, suspicious for primary bronchogenic carcinoma & two new indistinct peribronchovascular pulmonary nodules in the superior segment right lower lobe. Pt is being treated for postobstructive PNA. PMH: CHF, COPD, Hep C, dyslipidemia, CAD, HTN, paroxysmal a-fib on Eliquis, breast CA, CKD, NSTEMI ?  ?OT comments ? Upon entering the room, pt supine in bed with family present in the room and agreeable to OT intervention. Pt performing bed mobility with supervision to EOB. Pt standing with supervision and ambulating to sink with use of RW. Pt stands at sink for ~ 8 minutes to remove dentures, complete oral care, and wash face with set up A to obtain all needed items and supervision for standing balance. Pt then ambulates to bathroom in same manner and is able to perform clothing management and hygiene with close supervision. She stands for hand hygiene and to wash buttocks and peri are before returning to sit on EOB and don underwear. Pt returns to bed secondary to fatigue at end of session. All needs within reach and family present.   ? ?Recommendations for follow up therapy are one component of a multi-disciplinary discharge planning process, led by the attending physician.  Recommendations may be updated based on patient status, additional functional criteria and insurance authorization. ?   ?Follow Up Recommendations ? Home health OT  ?  ?Assistance Recommended at Discharge Intermittent Supervision/Assistance  ?Patient can return home with the following ? Assistance with cooking/housework;Assist for transportation ?  ?Equipment Recommendations ? None recommended by OT  ?  ?   ?Precautions / Restrictions Precautions ?Precautions:  Fall  ? ? ?  ? ?Mobility Bed Mobility ?Overal bed mobility: Needs Assistance ?Bed Mobility: Supine to Sit, Sit to Supine ?  ?  ?Supine to sit: HOB elevated, Min guard ?Sit to supine: Supervision ?  ?  ?  ? ?Transfers ?Overall transfer level: Needs assistance ?Equipment used: Rolling walker (2 wheels) ?Transfers: Sit to/from Stand ?Sit to Stand: Supervision ?  ?  ?  ?  ?  ?  ?  ?  ?Balance Overall balance assessment: Needs assistance ?Sitting-balance support: Feet supported ?Sitting balance-Leahy Scale: Good ?  ?  ?Standing balance support: During functional activity, Bilateral upper extremity supported, Reliant on assistive device for balance ?Standing balance-Leahy Scale: Fair ?  ?  ?  ?  ?  ?  ?  ?  ?  ?  ?  ?  ?   ? ?ADL either performed or assessed with clinical judgement  ? ?ADL Overall ADL's : Needs assistance/impaired ?  ?  ?Grooming: Wash/dry hands;Standing;Supervision/safety ?  ?  ?  ?Lower Body Bathing: Supervison/ safety;Set up;Sit to/from stand ?  ?  ?  ?Lower Body Dressing: Supervision/safety;Sit to/from stand ?  ?Toilet Transfer: Supervision/safety;Rolling walker (2 wheels);Ambulation ?  ?Toileting- Clothing Manipulation and Hygiene: Supervision/safety;Sit to/from stand ?  ?  ?  ?Functional mobility during ADLs: Supervision/safety;Rolling walker (2 wheels) ?  ?  ? ?Extremity/Trunk Assessment Upper Extremity Assessment ?Upper Extremity Assessment: Generalized weakness ?  ?Lower Extremity Assessment ?Lower Extremity Assessment: Generalized weakness ?  ?  ?  ? ?Vision Patient Visual Report: No change from baseline ?  ?  ?   ?   ? ?Cognition Arousal/Alertness: Awake/alert ?Behavior During  Therapy: WFL for tasks assessed/performed ?Overall Cognitive Status: Within Functional Limits for tasks assessed ?  ?  ?  ?  ?  ?  ?  ?  ?  ?  ?  ?  ?  ?  ?  ?  ?General Comments: patient able to follow single step commands without difficulty. ?  ?  ?   ?   ?   ?   ? ? ?Pertinent Vitals/ Pain       Pain Assessment ?Pain  Assessment: No/denies pain ? ?   ?   ? ?Frequency ? Min 2X/week  ? ? ? ? ?  ?Progress Toward Goals ? ?OT Goals(current goals can now be found in the care plan section) ? Progress towards OT goals: Progressing toward goals ? ?Acute Rehab OT Goals ?Patient Stated Goal: to go home ?OT Goal Formulation: With patient ?Time For Goal Achievement: 11/20/21 ?Potential to Achieve Goals: Good  ?Plan Discharge plan remains appropriate;Frequency remains appropriate   ? ?   ?AM-PAC OT "6 Clicks" Daily Activity     ?Outcome Measure ? ? Help from another person eating meals?: None ?Help from another person taking care of personal grooming?: None ?Help from another person toileting, which includes using toliet, bedpan, or urinal?: A Little ?Help from another person bathing (including washing, rinsing, drying)?: A Little ?Help from another person to put on and taking off regular upper body clothing?: None ?Help from another person to put on and taking off regular lower body clothing?: A Little ?6 Click Score: 21 ? ?  ?End of Session Equipment Utilized During Treatment: Rolling walker (2 wheels);Oxygen ? ?OT Visit Diagnosis: Unsteadiness on feet (R26.81);Muscle weakness (generalized) (M62.81) ?  ?Activity Tolerance Patient tolerated treatment well ?  ?Patient Left in chair;with call bell/phone within reach;with chair alarm set ?  ?Nurse Communication Mobility status ?  ? ?   ? ?Time: 4315-4008 ?OT Time Calculation (min): 27 min ? ?Charges: OT General Charges ?$OT Visit: 1 Visit ?OT Treatments ?$Self Care/Home Management : 23-37 mins ? ?Darleen Crocker, MS, OTR/L , CBIS ?ascom [phone]  ?11/11/21, 1:31 PM  ?

## 2021-11-11 NOTE — Assessment & Plan Note (Addendum)
As above.  Being discharged on IV ertapenem through midline.  Home health being set up. ? ? ?

## 2021-11-11 NOTE — Assessment & Plan Note (Addendum)
-   Appreciate pulmonary and oncology input.  Status post bronchoscopy with pathology pending although appears to be small cell lung carcinoma.  Patient will follow-up with oncology and pulmonary. ?

## 2021-11-11 NOTE — Assessment & Plan Note (Signed)
Meets criteria with BMI greater than 25 

## 2021-11-11 NOTE — Assessment & Plan Note (Addendum)
Secondary to pneumonia, lung mass and acute on chronic diastolic heart failure.  Continue Lasix 40 mg IV daily as well as antibiotics. ?Net IO Since Admission: -14,647 mL [11/12/21 1446]  ?Currently on 2 L nasal cannula, her baseline ?

## 2021-11-11 NOTE — Progress Notes (Signed)
Mobility Specialist - Progress Note ? ? 11/11/21 1400  ?Mobility  ?Activity Off unit  ? ? ? ?Pt off unit for procedure at this time. Will attempt another date/time.  ? ? ?Kathee Delton ?Mobility Specialist ?11/11/21, 2:59 PM ? ? ? ? ?

## 2021-11-11 NOTE — Progress Notes (Signed)
? ? ? ?PULMONOLOGY ? ? ? ? ? ? ? ? ?Date: 11/11/2021,   ?MRN# [id number] Kristin [name] [birth year] ? ? ?  ?AdmissionWeight: 77.2 kg                 ?CurrentWeight: 71.7 kg ? ? ?Referring physician: Dr Sidney Ace ? ? ?CHIEF COMPLAINT:  ? ?Lung mass and mediastinal lymphadenopathy ? ? ?HISTORY OF PRESENT ILLNESS  ? ?This is a pleasant 88 yr old F, she quit smoking 35 years ago per patient and is in good health not demented. We talked about her medical history which is listed belowe in [place] history.  She clearly states she wants everything done.  She has reviewed chest imaging with me and her wishes are to have full scope of therapy.  ? ?She has asked me to discuss these findings with her daughter Kristin [name] who is a Education officer, museum.  ? ?She does uses oxygen at home at 2L/min nasal canula and states this is chronic at least 2 years using mostly nocturnally and recently started using during daytime.  ? ?She is ambulatory and stable. ? ?11/10/21-  patient is stable on 1-2L/min [place] no overnight events. Bronch at 12pm tommorow ? ?11/11/21- patient is stable for bronchoscopy. We discussed procedure again in detail and answered all questions. Plan for dc in am.   ? ?PAST MEDICAL HISTORY  ? ?Past Medical History:  ?Diagnosis Date  ?? Acute postoperative pain 02/09/2018  ?? Arrhythmia, sinus node 04/03/2014  ?? Arthritis   ?? Arthritis   ?? Atrial fibrillation ([place])   ?? Back pain   ? lower back chronic  ?? Bradycardia   ?? Breast cancer ([place]) 2004  ? right breast cancer  ?? Cancer [facility]) 2004  ? rt breast cancer-post lumpectomy- chemo/rad  ?? CHF (congestive heart failure) ([place])   ?? Chronic back pain   ?? CKD (chronic kidney disease)   ?? COPD (chronic obstructive pulmonary disease) ([place])   ? wears O2 at 2L via [place] at night  ?? COVID-19 06/11/2020  ?? Cystocele   ?? Decubitus ulcers   ?? Dehydration   ?? Gout   ?? Hematuria   ?? Hepatitis C   ?? Hyperlipidemia   ?? Hypertension   ?? Hypomagnesemia 06/25/2015  ?? NSTEMI (non-ST elevated  myocardial infarction) ([place]) 04/08/2021  ?? OAB (overactive bladder)   ?? Overactive bladder   ?? Overdose opiate, accidental or unintentional, sequela 10/02/2020  ?? Restless leg   ?? Shoulder pain, bilateral   ?? Urinary frequency   ?? UTI (lower urinary tract infection)   ?? Vaginal atrophy   ? ? ? ?SURGICAL HISTORY  ? ?Past Surgical History:  ?Procedure Laterality Date  ?? ABDOMINAL HYSTERECTOMY    ?? BACK SURGERY    ?? BREAST LUMPECTOMY Right 2004  ?? BREAST SURGERY    ? rt lumpectomy  ?? CARPAL TUNNEL RELEASE    ?? CATARACT EXTRACTION W/PHACO  10/19/2011  ? Procedure: CATARACT EXTRACTION PHACO AND INTRAOCULAR LENS PLACEMENT (IOC);  Surgeon: Elta Guadeloupe T. Gershon Crane, MD;  Location: [facility];  Service: Ophthalmology;  Laterality: Right;  CDE:10.81  ?? CATARACT EXTRACTION W/PHACO  11/02/2011  ? Procedure: CATARACT EXTRACTION PHACO AND INTRAOCULAR LENS PLACEMENT (IOC);  Surgeon: Elta Guadeloupe T. Gershon Crane, MD;  Location: [facility];  Service: Ophthalmology;  Laterality: Left;  CDE 8.60  ?? CHOLECYSTECTOMY    ? 3/18  ?? COLON SURGERY    ?? INSERT / REPLACE / REMOVE PACEMAKER    ?? JOINT REPLACEMENT    ?  bilateral TKA  ?? LOWER EXTREMITY ANGIOGRAPHY Left 10/14/2020  ? Procedure: LOWER EXTREMITY ANGIOGRAPHY;  Surgeon: Katha Cabal, MD;  Location: [place] [facility];  Service: Cardiovascular;  Laterality: Left;  ?? LOWER EXTREMITY ANGIOGRAPHY Right 09/08/2021  ? Procedure: LOWER EXTREMITY ANGIOGRAPHY;  Surgeon: Katha Cabal, MD;  Location: [place] [facility];  Service: Cardiovascular;  Laterality: Right;  ?? PACEMAKER INSERTION N/A 12/24/2015  ? Procedure: INSERTION PACEMAKER;  Surgeon: Isaias Cowman, MD;  Location: [facility];  Service: Cardiovascular;  Laterality: N/A;  ?? ROTATOR CUFF REPAIR    ? bilateral  ? ? ? ?FAMILY HISTORY  ? ?Family History  ?Problem Relation Age of Onset  ?? Kidney disease Brother   ?? Heart disease Father   ?? Heart disease Mother   ?? Stroke Mother   ?? Cancer Sister   ?? Breast cancer Sister 63  ??  Breast cancer Paternal Aunt   ?? Anesthesia problems Neg Hx   ?? Hypotension Neg Hx   ?? Malignant hyperthermia Neg Hx   ?? Pseudochol deficiency Neg Hx   ? ? ? ?SOCIAL HISTORY  ? ?Social History  ? ?Tobacco Use  ?? Smoking status: Former  ?  Packs/day: 1.25  ?  Years: 40.00  ?  Pack years: 50.00  ?  Types: Cigarettes  ?  Quit date: 12/16/1998  ?  Years since quitting: 22.9  ?? Smokeless tobacco: Never  ?Vaping Use  ?? Vaping Use: Never used  ?Substance Use Topics  ?? Alcohol use: No  ?  Alcohol/week: 0.0 standard drinks  ?? Drug use: Yes  ?  Types: Oxycodone  ? ? ? ?MEDICATIONS  ? ? ?Home Medication:  ? ?  ?Current Medication: ? ?Current Facility-Administered Medications:  ??  0.9 %  sodium chloride infusion, , Intravenous, Continuous, Menshew, Jenise V Bacon, PA-C, Last Rate: 10 mL/hr at 11/11/21 0628, New Bag at 11/11/21 7096 ??  acetaminophen (OFIRMEV) IV 1,000 mg, 1,000 mg, Intravenous, Once PRN, Darrin Nipper, MD ??  [MAR Hold] acetaminophen (TYLENOL) tablet 650 mg, 650 mg, Oral, Q6H PRN, 650 mg at 11/09/21 1800 **OR** [MAR Hold] acetaminophen (TYLENOL) suppository 650 mg, 650 mg, Rectal, Q6H PRN, Mansy, Arvella Merles, MD ??  [MAR Hold] albuterol (PROVENTIL) (2.5 MG/3ML) 0.083% nebulizer solution 2.5 mg, 2.5 mg, Nebulization, Q4H PRN, Max Sane, MD, 2.5 mg at 11/06/21 1320 ??  [MAR Hold] atorvastatin (LIPITOR) tablet 20 mg, 20 mg, Oral, Daily, Mansy, Jan A, MD, 20 mg at 11/10/21 1008 ??  [MAR Hold] bisacodyl (DULCOLAX) EC tablet 10 mg, 10 mg, Oral, Daily PRN, Mansy, Jan A, MD, 10 mg at 11/09/21 2012 ??  butamben-tetracaine-benzocaine (CETACAINE) spray 1 spray, 1 spray, Topical, Once, Ottie Glazier, MD ??  [MAR Hold] cetirizine (ZYRTEC) tablet 5 mg, 5 mg, Oral, Daily PRN, Mansy, Arvella Merles, MD ??  [MAR Hold] chlorpheniramine-HYDROcodone 10-8 MG/5ML suspension 5 mL, 5 mL, Oral, Q12H PRN, Mansy, Jan A, MD, 5 mL at 11/10/21 1007 ??  [MAR Hold] cholecalciferol (VITAMIN D3) tablet 2,000 Units, 2,000 Units, Oral, Daily,  Mansy, Arvella Merles, MD, 2,000 Units at 11/10/21 1008 ??  [MAR Hold] diclofenac Sodium (VOLTAREN) 1 % topical gel 4 g, 4 g, Topical, BID PRN, Mansy, Jan A, MD, 4 g at 11/08/21 2136 ??  [MAR Hold] ertapenem (INVANZ) 1,000 mg in sodium chloride 0.9 % 100 mL IVPB, 1 g, Intravenous, Q24H, Carlyle Basques, MD, Last Rate: 200 mL/hr at 11/11/21 0629, 1,000 mg at 11/11/21 0629 ??  [MAR Hold] feeding supplement (ENSURE ENLIVE /  ENSURE PLUS) liquid 237 mL, 237 mL, Oral, BID BM, Annita Brod, MD ??  fentaNYL (SUBLIMAZE) injection 25-50 mcg, 25-50 mcg, Intravenous, Q5 min PRN, Darrin Nipper, MD ??  [MAR Hold] furosemide (LASIX) injection 40 mg, 40 mg, Intravenous, Daily, Lanney Gins, Rik Wadel, MD, 40 mg at 11/11/21 0909 ??  [MAR Hold] gabapentin (NEURONTIN) capsule 300 mg, 300 mg, Oral, TID, Uliana Brinker, MD, 300 mg at 11/10/21 2126 ??  [MAR Hold] guaiFENesin (MUCINEX) 12 hr tablet 600 mg, 600 mg, Oral, BID, Mansy, Jan A, MD, 600 mg at 11/10/21 2126 ??  [MAR Hold] ipratropium-albuterol (DUONEB) 0.5-2.5 (3) MG/3ML nebulizer solution 3 mL, 3 mL, Nebulization, TID, Max Sane, MD, 3 mL at 11/11/21 0807 ??  lactated ringers infusion, , Intravenous, Continuous, Darrin Nipper, MD ??  lidocaine (XYLOCAINE) 1 % (with pres) injection 30 mL, 30 mL, Infiltration, Once, Ottie Glazier, MD ??  lidocaine (XYLOCAINE) 2 % jelly 1 application., 1 application., Topical, Once, Ottie Glazier, MD ??  [MAR Hold] magnesium hydroxide (MILK OF MAGNESIA) suspension 30 mL, 30 mL, Oral, Daily PRN, Mansy, Arvella Merles, MD ??  [MAR Hold] melatonin tablet 10 mg, 10 mg, Oral, QHS, Mansy, Jan A, MD, 10 mg at 11/10/21 2126 ??  [MAR Hold] metoCLOPramide (REGLAN) injection 10 mg, 10 mg, Intravenous, Q6H PRN, Mansy, Arvella Merles, MD ??  [MAR Hold] multivitamin with minerals tablet 1 tablet, 1 tablet, Oral, Daily, Annita Brod, MD ??  oxyCODONE (Oxy IR/ROXICODONE) immediate release tablet 5 mg, 5 mg, Oral, Once PRN **OR** oxyCODONE (ROXICODONE) 5 MG/5ML solution 5 mg,  5 mg, Oral, Once PRN, Darrin Nipper, MD ??  phenylephrine (NEO-SYNEPHRINE) 0.25 % nasal spray 1 spray, 1 spray, Each Nare, Q6H PRN, Ottie Glazier, MD ??  [MAR Hold] rOPINIRole (REQUIP) tablet 4 mg, 4 mg, Or

## 2021-11-12 ENCOUNTER — Encounter: Payer: Self-pay | Admitting: Pulmonary Disease

## 2021-11-12 DIAGNOSIS — J9601 Acute respiratory failure with hypoxia: Secondary | ICD-10-CM | POA: Diagnosis not present

## 2021-11-12 DIAGNOSIS — R918 Other nonspecific abnormal finding of lung field: Secondary | ICD-10-CM | POA: Diagnosis not present

## 2021-11-12 DIAGNOSIS — I5033 Acute on chronic diastolic (congestive) heart failure: Secondary | ICD-10-CM | POA: Diagnosis not present

## 2021-11-12 DIAGNOSIS — A4151 Sepsis due to Escherichia coli [E. coli]: Secondary | ICD-10-CM | POA: Diagnosis not present

## 2021-11-12 MED ORDER — HYDROCOD POLI-CHLORPHE POLI ER 10-8 MG/5ML PO SUER: 5.0000 mL | Freq: Two times a day (BID) | ORAL | 0 refills | Status: DC | PRN

## 2021-11-12 MED ORDER — ADULT MULTIVITAMIN W/MINERALS CH: 1.0000 | ORAL_TABLET | Freq: Every day | ORAL | 1 refills | Status: DC

## 2021-11-12 MED ORDER — ERTAPENEM IV (FOR PTA / DISCHARGE USE ONLY): 1.0000 g | INTRAVENOUS | 0 refills | Status: AC

## 2021-11-12 MED ORDER — GABAPENTIN 300 MG PO CAPS: 300.0000 mg | ORAL_CAPSULE | Freq: Three times a day (TID) | ORAL | 1 refills | Status: DC

## 2021-11-12 MED ORDER — ENSURE ENLIVE PO LIQD: 237.0000 mL | Freq: Two times a day (BID) | ORAL | 12 refills | Status: DC

## 2021-11-12 NOTE — Progress Notes (Signed)
Occupational Therapy Treatment ?Patient Details ?Name: Kristin [name] ?MRN: [id number] ?DOB: 1930-08-10 ?Today's Date: 11/12/2021 ? ? ?History of present illness Pt is a 88 y/o F admitted on 11/05/21 after presenting with c/c of SOB. fever & chills. CT showed posterior right upper lobe lung mass, new, suspicious for primary bronchogenic carcinoma & two new indistinct peribronchovascular pulmonary nodules in the superior segment right lower lobe. Pt is being treated for postobstructive PNA. PMH: CHF, COPD, Hep C, dyslipidemia, CAD, HTN, paroxysmal a-fib on Eliquis, breast CA, CKD, NSTEMI ?  ?OT comments ? Upon entering the room, pt seated in recliner chair and ultrasound arriving and requesting pt to return to recliner chair for procedure. Pt standing with supervision and transfers with RW and close supervision for balance. Pt performing bed mobility independently with increased time and effort. OT providing pt with paper handout in regards to energy conservation for home and self care. OT briefly reviewed but asking pt to review it further and to be discussed further at next session. All needs within reach. PT arrived as therapist exits the room.   ? ?Recommendations for follow up therapy are one component of a multi-disciplinary discharge planning process, led by the attending physician.  Recommendations may be updated based on patient status, additional functional criteria and insurance authorization. ?   ?Follow Up Recommendations ? Home health OT  ?  ?Assistance Recommended at Discharge Intermittent Supervision/Assistance  ?Patient can return home with the following ? Assistance with cooking/housework;Assist for transportation ?  ?Equipment Recommendations ? None recommended by OT  ?  ?   ?Precautions / Restrictions Precautions ?Precautions: Fall ?Restrictions ?Weight Bearing Restrictions: Yes ?LLE Weight Bearing: Weight bearing as tolerated  ? ? ?  ? ?Mobility Bed Mobility ?Overal bed mobility: Modified  Independent ?Bed Mobility: Supine to Sit, Sit to Supine ?  ?  ?  ?Sit to supine: Modified independent (Device/Increase time) ?  ?General bed mobility comments: increased time and effort but no physical assistance needed ?  ? ?Transfers ?Overall transfer level: Needs assistance ?Equipment used: Rolling walker (2 wheels) ?Transfers: Sit to/from Stand, Bed to chair/wheelchair/BSC ?Sit to Stand: Supervision ?  ?  ?Step pivot transfers: Supervision ?  ?  ?  ?  ?  ?Balance Overall balance assessment: Needs assistance ?Sitting-balance support: Feet supported ?Sitting balance-Leahy Scale: Good ?  ?  ?Standing balance support: During functional activity, Bilateral upper extremity supported, Reliant on assistive device for balance ?Standing balance-Leahy Scale: Fair ?  ?  ?  ?  ?  ?  ?  ?  ?  ?  ?  ?  ?   ? ?ADL either performed or assessed with clinical judgement  ? ? ?Extremity/Trunk Assessment Upper Extremity Assessment ?Upper Extremity Assessment: Generalized weakness ?  ?Lower Extremity Assessment ?Lower Extremity Assessment: Generalized weakness ?  ?  ?  ? ?Vision Patient Visual Report: No change from baseline ?  ?  ?   ?   ? ?Cognition Arousal/Alertness: Awake/alert ?Behavior During Therapy: [facility] for tasks assessed/performed ?Overall Cognitive Status: Within Functional Limits for tasks assessed ?  ?  ?  ?  ?  ?  ?  ?  ?  ?  ?  ?  ?  ?  ?  ?  ?General Comments: patient able to follow single step commands without difficulty. ?  ?  ?   ?   ?   ?   ? ? ?Pertinent Vitals/ Pain       Pain Assessment ?Pain Assessment:  No/denies pain ? ?   ?   ? ?Frequency ? Min 2X/week  ? ? ? ? ?  ?Progress Toward Goals ? ?OT Goals(current goals can now be found in the care plan section) ? Progress towards OT goals: Progressing toward goals ? ?Acute Rehab OT Goals ?Patient Stated Goal: to go home ?OT Goal Formulation: With patient ?Time For Goal Achievement: 11/20/21 ?Potential to Achieve Goals: Good  ?Plan Discharge plan remains  appropriate;Frequency remains appropriate   ? ?   ?AM-PAC OT "6 Clicks" Daily Activity     ?Outcome Measure ? ? Help from another person eating meals?: None ?Help from another person taking care of personal grooming?: None ?Help from another person toileting, which includes using toliet, bedpan, or urinal?: A Little ?Help from another person bathing (including washing, rinsing, drying)?: A Little ?Help from another person to put on and taking off regular upper body clothing?: None ?Help from another person to put on and taking off regular lower body clothing?: A Little ?6 Click Score: 21 ? ?  ?End of Session Equipment Utilized During Treatment: Rolling walker (2 wheels);Oxygen ? ?OT Visit Diagnosis: Unsteadiness on feet (R26.81);Muscle weakness (generalized) (M62.81) ?  ?Activity Tolerance Patient tolerated treatment well ?  ?Patient Left in chair;with call bell/phone within reach;with chair alarm set ?  ?Nurse Communication Mobility status ?  ? ?   ? ?Time: 1610-9604 ?OT Time Calculation (min): 12 min ? ?Charges: OT General Charges ?$OT Visit: 1 Visit ?OT Treatments ?$Therapeutic Activity: 8-22 mins ? ?Darleen Crocker, [place], OTR/L , CBIS ?ascom [phone]  ?11/12/21, 3:28 PM  ?

## 2021-11-12 NOTE — Progress Notes (Signed)
Pt being discharged home with home health, discharge instructions in packet and sent with pt as requested by son, son here at this time to transport pt, pt with no complaints ?

## 2021-11-12 NOTE — Progress Notes (Signed)
Physical Therapy Treatment ?Patient Details ?Name: Kristin [name] ?MRN: [id number] ?DOB: 1930/01/24 ?Today's Date: 11/12/2021 ? ? ?History of Present Illness Pt is a 88 y/o F admitted on 11/05/21 after presenting with c/c of SOB. fever & chills. CT showed posterior right upper lobe lung mass, new, suspicious for primary bronchogenic carcinoma & two new indistinct peribronchovascular pulmonary nodules in the superior segment right lower lobe. Pt is being treated for postobstructive PNA. PMH: CHF, COPD, Hep C, dyslipidemia, CAD, HTN, paroxysmal a-fib on Eliquis, breast CA, CKD, NSTEMI ? ?  ?PT Comments  ? ? The patient had just been assisted back to bed on arrival to the room in preparation for a procedure. She was agreeable to LE exercises for strengthening which she performed with AROM and AAROM. She was educated on energy conservation techniques to use in home setting and verbalized understanding. The patient has made progress with functional independence while in the hospital and has been routinely getting out of bed with staff assistance. Anticipate patient can return home when medically ready. Recommend HHPT.  ?  ?Recommendations for follow up therapy are one component of a multi-disciplinary discharge planning process, led by the attending physician.  Recommendations may be updated based on patient status, additional functional criteria and insurance authorization. ? ?Follow Up Recommendations ? Home health PT ?  ?  ?Assistance Recommended at Discharge Intermittent Supervision/Assistance  ?Patient can return home with the following A little help with walking and/or transfers;A little help with bathing/dressing/bathroom;Assistance with cooking/housework;Assist for transportation;Direct supervision/assist for financial management;Direct supervision/assist for medications management ?  ?Equipment Recommendations ? Rolling walker (2 wheels)  ?  ?Recommendations for Other Services   ? ? ?  ?Precautions / Restrictions  Precautions ?Precautions: Fall ?Restrictions ?Weight Bearing Restrictions: Yes ?LLE Weight Bearing: Weight bearing as tolerated  ?  ? ?Mobility ? Bed Mobility ?Overal bed mobility: Needs Assistance ?Bed Mobility: Supine to Sit (long sitting) ?  ?  ?Supine to sit: Modified independent (Device/Increase time) ?  ?  ?General bed mobility comments: patient had just gotten back to bed in preparation for an upcoming procedure this afternoon. she was agreeable to LE exercises in bed. she is Mod I with sitting up and repositioning in bed ?  ? ?Transfers ?  ?  ?  ?  ?  ?  ?  ?  ?  ?  ?  ? ?Ambulation/Gait ?  ?  ?  ?  ?  ?  ?  ?  ? ? ?Stairs ?  ?  ?  ?  ?  ? ? ?Wheelchair Mobility ?  ? ?Modified Rankin (Stroke Patients Only) ?  ? ? ?  ?Balance   ?  ?Sitting balance-Leahy Scale: Good ?Sitting balance - Comments: good sitting balance demonstrated while long sitting in bed ?  ?  ?  ?  ?  ?  ?  ?  ?  ?  ?  ?  ?  ?  ?  ?  ? ?  ?Cognition Arousal/Alertness: Awake/alert ?Behavior During Therapy: [facility] for tasks assessed/performed ?Overall Cognitive Status: Within Functional Limits for tasks assessed ?  ?  ?  ?  ?  ?  ?  ?  ?  ?  ?  ?  ?  ?  ?  ?  ?General Comments: patient able to follow single step commands with extra time ?  ?  ? ?  ?Exercises General Exercises - Lower Extremity ?Ankle Circles/Pumps: AROM, Strengthening, Both, 10 reps, Supine ?Heel Slides: AAROM,  Strengthening, Both, 10 reps, Supine ?Hip ABduction/ADduction: AAROM, Strengthening, Both, 10 reps, Supine ?Straight Leg Raises: AAROM, Strengthening, Both, 10 reps, Supine ?Other Exercises ?Other Exercises: verbal and visual cues for exercise technique for strengthening of LE ? ?  ?General Comments General comments (skin integrity, edema, etc.): patient educated on energy conservation techniques to use in home setting. Sp02 91% after LE exercise with mild increased respiration rate noted with activity on 2 L02. ?  ?  ? ?Pertinent Vitals/Pain Pain Assessment ?Pain  Assessment: No/denies pain  ? ? ?Home Living   ?  ?  ?  ?  ?  ?  ?  ?  ?  ?   ?  ?Prior Function    ?  ?  ?   ? ?PT Goals (current goals can now be found in the care plan section) Acute Rehab PT Goals ?Patient Stated Goal: to return home ?PT Goal Formulation: With patient ?Time For Goal Achievement: 11/20/21 ?Potential to Achieve Goals: Good ?Progress towards PT goals: Progressing toward goals ? ?  ?Frequency ? ? ? Min 2X/week ? ? ? ?  ?PT Plan Current plan remains appropriate  ? ? ?Co-evaluation   ?  ?  ?  ?  ? ?  ?AM-PAC PT "6 Clicks" Mobility   ?Outcome Measure ? Help needed turning from your back to your side while in a flat bed without using bedrails?: None ?Help needed moving from lying on your back to sitting on the side of a flat bed without using bedrails?: A Little ?Help needed moving to and from a bed to a chair (including a wheelchair)?: A Little ?Help needed standing up from a chair using your arms (e.g., wheelchair or bedside chair)?: None ?Help needed to walk in hospital room?: A Little ?Help needed climbing 3-5 steps with a railing? : A Little ?6 Click Score: 20 ? ?  ?End of Session Equipment Utilized During Treatment: Oxygen ?Activity Tolerance: Patient tolerated treatment well ?Patient left: in bed;with call bell/phone within reach;with bed alarm set ?Nurse Communication: Mobility status ?PT Visit Diagnosis: Muscle weakness (generalized) (M62.81) ?  ? ? ?Time: 2947-6546 ?PT Time Calculation (min) (ACUTE ONLY): 13 min ? ?Charges:  $Therapeutic Exercise: 8-22 mins          ?          ? ?Minna Merritts, PT, MPT ? ? ? ?Percell Locus ?11/12/2021, 3:09 PM ? ?

## 2021-11-12 NOTE — Progress Notes (Signed)
? ? ? ?PULMONOLOGY ? ? ? ? ? ? ? ? ?Date: 11/12/2021,   ?MRN# [id number] Kristin [name] Dec 25, 1929 ? ? ?  ?AdmissionWeight: 77.2 kg                 ?CurrentWeight: 71.7 kg ? ? ?Referring physician: Dr Sidney Ace ? ? ?CHIEF COMPLAINT:  ? ?Lung mass and mediastinal lymphadenopathy ? ? ?HISTORY OF PRESENT ILLNESS  ? ?This is a pleasant 88 yr old F, she quit smoking 35 years ago per patient and is in good health not demented. We talked about her medical history which is listed belowe in [place] history.  She clearly states she wants everything done.  She has reviewed chest imaging with me and her wishes are to have full scope of therapy.  ? ?She has asked me to discuss these findings with her daughter Kristin [name] who is a Education officer, museum.  ? ?She does uses oxygen at home at 2L/min nasal canula and states this is chronic at least 2 years using mostly nocturnally and recently started using during daytime.  ? ?She is ambulatory and stable. ? ?11/10/21-  patient is stable on 1-2L/min [place] no overnight events. Bronch at 12pm tommorow ? ?11/12/21- patient is stable and reports few clots of blood on expectorate.  She may be dcd home and can follow up with medical oncology or myself on outpatient.  ? ?PAST MEDICAL HISTORY  ? ?Past Medical History:  ?Diagnosis Date  ?? Acute postoperative pain 02/09/2018  ?? Arrhythmia, sinus node 04/03/2014  ?? Arthritis   ?? Arthritis   ?? Atrial fibrillation ([place])   ?? Back pain   ? lower back chronic  ?? Bradycardia   ?? Breast cancer ([place]) 2004  ? right breast cancer  ?? Cancer [facility]) 2004  ? rt breast cancer-post lumpectomy- chemo/rad  ?? CHF (congestive heart failure) ([place])   ?? Chronic back pain   ?? CKD (chronic kidney disease)   ?? COPD (chronic obstructive pulmonary disease) ([place])   ? wears O2 at 2L via [place] at night  ?? COVID-19 06/11/2020  ?? Cystocele   ?? Decubitus ulcers   ?? Dehydration   ?? Gout   ?? Hematuria   ?? Hepatitis C   ?? Hyperlipidemia   ?? Hypertension   ?? Hypomagnesemia 06/25/2015  ??  NSTEMI (non-ST elevated myocardial infarction) ([place]) 04/08/2021  ?? OAB (overactive bladder)   ?? Overactive bladder   ?? Overdose opiate, accidental or unintentional, sequela 10/02/2020  ?? Restless leg   ?? Shoulder pain, bilateral   ?? Urinary frequency   ?? UTI (lower urinary tract infection)   ?? Vaginal atrophy   ? ? ? ?SURGICAL HISTORY  ? ?Past Surgical History:  ?Procedure Laterality Date  ?? ABDOMINAL HYSTERECTOMY    ?? BACK SURGERY    ?? BREAST LUMPECTOMY Right 2004  ?? BREAST SURGERY    ? rt lumpectomy  ?? CARPAL TUNNEL RELEASE    ?? CATARACT EXTRACTION W/PHACO  10/19/2011  ? Procedure: CATARACT EXTRACTION PHACO AND INTRAOCULAR LENS PLACEMENT (IOC);  Surgeon: Elta Guadeloupe T. Gershon Crane, MD;  Location: [facility];  Service: Ophthalmology;  Laterality: Right;  CDE:10.81  ?? CATARACT EXTRACTION W/PHACO  11/02/2011  ? Procedure: CATARACT EXTRACTION PHACO AND INTRAOCULAR LENS PLACEMENT (IOC);  Surgeon: Elta Guadeloupe T. Gershon Crane, MD;  Location: [facility];  Service: Ophthalmology;  Laterality: Left;  CDE 8.60  ?? CHOLECYSTECTOMY    ? 3/18  ?? COLON SURGERY    ?? INSERT / REPLACE / REMOVE PACEMAKER    ??  JOINT REPLACEMENT    ? bilateral TKA  ?? LOWER EXTREMITY ANGIOGRAPHY Left 10/14/2020  ? Procedure: LOWER EXTREMITY ANGIOGRAPHY;  Surgeon: Katha Cabal, MD;  Location: [place] [facility];  Service: Cardiovascular;  Laterality: Left;  ?? LOWER EXTREMITY ANGIOGRAPHY Right 09/08/2021  ? Procedure: LOWER EXTREMITY ANGIOGRAPHY;  Surgeon: Katha Cabal, MD;  Location: [place] [facility];  Service: Cardiovascular;  Laterality: Right;  ?? PACEMAKER INSERTION N/A 12/24/2015  ? Procedure: INSERTION PACEMAKER;  Surgeon: Isaias Cowman, MD;  Location: [facility];  Service: Cardiovascular;  Laterality: N/A;  ?? ROTATOR CUFF REPAIR    ? bilateral  ?? VIDEO BRONCHOSCOPY WITH ENDOBRONCHIAL NAVIGATION Right 11/11/2021  ? Procedure: VIDEO BRONCHOSCOPY WITH ENDOBRONCHIAL NAVIGATION;  Surgeon: Ottie Glazier, MD;  Location: [facility];  Service:  Thoracic;  Laterality: Right;  ? ? ? ?FAMILY HISTORY  ? ?Family History  ?Problem Relation Age of Onset  ?? Kidney disease Brother   ?? Heart disease Father   ?? Heart disease Mother   ?? Stroke Mother   ?? Cancer Sister   ?? Breast cancer Sister 45  ?? Breast cancer Paternal Aunt   ?? Anesthesia problems Neg Hx   ?? Hypotension Neg Hx   ?? Malignant hyperthermia Neg Hx   ?? Pseudochol deficiency Neg Hx   ? ? ? ?SOCIAL HISTORY  ? ?Social History  ? ?Tobacco Use  ?? Smoking status: Former  ?  Packs/day: 1.25  ?  Years: 40.00  ?  Pack years: 50.00  ?  Types: Cigarettes  ?  Quit date: 12/16/1998  ?  Years since quitting: 22.9  ?? Smokeless tobacco: Never  ?Vaping Use  ?? Vaping Use: Never used  ?Substance Use Topics  ?? Alcohol use: No  ?  Alcohol/week: 0.0 standard drinks  ?? Drug use: Yes  ?  Types: Oxycodone  ? ? ? ?MEDICATIONS  ? ? ?Home Medication:  ? ?  ?Current Medication: ? ?Current Facility-Administered Medications:  ??  0.9 %  sodium chloride infusion, , Intravenous, Continuous, Menshew, Jenise Nonda Lou, PA-C, Stopped at 11/11/21 1207 ??  acetaminophen (TYLENOL) tablet 650 mg, 650 mg, Oral, Q6H PRN, 650 mg at 11/09/21 1800 **OR** acetaminophen (TYLENOL) suppository 650 mg, 650 mg, Rectal, Q6H PRN, Mansy, Jan A, MD ??  albuterol (PROVENTIL) (2.5 MG/3ML) 0.083% nebulizer solution 2.5 mg, 2.5 mg, Nebulization, Q4H PRN, Max Sane, MD, 2.5 mg at 11/06/21 1320 ??  atorvastatin (LIPITOR) tablet 20 mg, 20 mg, Oral, Daily, Mansy, Jan A, MD, 20 mg at 11/12/21 0745 ??  bisacodyl (DULCOLAX) EC tablet 10 mg, 10 mg, Oral, Daily PRN, Mansy, Jan A, MD, 10 mg at 11/11/21 2046 ??  cetirizine (ZYRTEC) tablet 5 mg, 5 mg, Oral, Daily PRN, Mansy, Arvella Merles, MD ??  chlorpheniramine-HYDROcodone 10-8 MG/5ML suspension 5 mL, 5 mL, Oral, Q12H PRN, Mansy, Jan A, MD, 5 mL at 11/10/21 1007 ??  cholecalciferol (VITAMIN D3) tablet 2,000 Units, 2,000 Units, Oral, Daily, Mansy, Jan A, MD, 2,000 Units at 11/12/21 0745 ??  diclofenac Sodium  (VOLTAREN) 1 % topical gel 4 g, 4 g, Topical, BID PRN, Mansy, Jan A, MD, 4 g at 11/08/21 2136 ??  ertapenem (INVANZ) 1,000 mg in sodium chloride 0.9 % 100 mL IVPB, 1 g, Intravenous, Q24H, Carlyle Basques, MD, Stopped at 11/12/21 0529 ??  feeding supplement (ENSURE ENLIVE / ENSURE PLUS) liquid 237 mL, 237 mL, Oral, BID BM, Annita Brod, MD, 237 mL at 11/12/21 1209 ??  furosemide (LASIX) injection 40 mg, 40 mg,  Intravenous, Daily, Ottie Glazier, MD, 40 mg at 11/12/21 0747 ??  gabapentin (NEURONTIN) capsule 300 mg, 300 mg, Oral, TID, Lanney Gins, Aman Bonet, MD, 300 mg at 11/12/21 0745 ??  guaiFENesin (MUCINEX) 12 hr tablet 600 mg, 600 mg, Oral, BID, Mansy, Jan A, MD, 600 mg at 11/12/21 0745 ??  ipratropium-albuterol (DUONEB) 0.5-2.5 (3) MG/3ML nebulizer solution 3 mL, 3 mL, Nebulization, TID, Max Sane, MD, 3 mL at 11/12/21 0751 ??  magnesium hydroxide (MILK OF MAGNESIA) suspension 30 mL, 30 mL, Oral, Daily PRN, Mansy, Arvella Merles, MD ??  melatonin tablet 10 mg, 10 mg, Oral, QHS, Mansy, Jan A, MD, 10 mg at 11/11/21 2033 ??  metoCLOPramide (REGLAN) injection 10 mg, 10 mg, Intravenous, Q6H PRN, Mansy, Arvella Merles, MD ??  multivitamin with minerals tablet 1 tablet, 1 tablet, Oral, Daily, Annita Brod, MD, 1 tablet at 11/12/21 0745 ??  rOPINIRole (REQUIP) tablet 4 mg, 4 mg, Oral, QHS, Mansy, Jan A, MD, 4 mg at 11/11/21 2033 ??  traZODone (DESYREL) tablet 25 mg, 25 mg, Oral, QHS PRN, Mansy, Arvella Merles, MD ? ? ? ?ALLERGIES  ? ?Flexeril [cyclobenzaprine hcl] and Vancomycin ? ? ? ? ?REVIEW OF SYSTEMS  ? ? ?Review of Systems: ? ?Gen:  Denies  fever, sweats, chills weigh loss  ?HEENT: Denies blurred vision, double vision, ear pain, eye pain, hearing loss, nose bleeds, sore throat ?Cardiac:  No dizziness, chest pain or heaviness, chest tightness,edema ?Resp:   Denies cough or sputum porduction, shortness of breath,wheezing, hemoptysis,  ?Gi: Denies swallowing difficulty, stomach pain, nausea or vomiting, diarrhea, constipation, bowel  incontinence ?Gu:  Denies bladder incontinence, burning urine ?Ext:   Denies Joint pain, stiffness or swelling ?Skin: Denies  skin rash, easy bruising or bleeding or hives ?Endoc:  Denies polyuria, polydipsia , polyph

## 2021-11-12 NOTE — Discharge Summary (Signed)
?Physician Discharge Summary ?  ?Patient: Kristin [name] MRN: [id number] DOB: June 25, 1930  ?Admit date:     11/05/2021  ?Discharge date: 11/12/21  ?Discharge Physician: Annita Brod  ? ?PCP: Olin Hauser, DO  ? ?Recommendations at discharge:  ? ?New medication: IV ertapenem 1 g daily starting 3/24 x 7 days ?Patient will follow-up with pulmonary and oncology. ? ?Discharge Diagnoses: ?Principal Problem: ?  Sepsis due to Escherichia coli (E. coli) ([place]) ?Active Problems: ?  Postobstructive pneumonia ?  Mass of upper lobe of right lung ?  Acute on chronic diastolic CHF (congestive heart failure) ([place]) ?  Acute respiratory failure with hypoxia ([place]) ?  Dyslipidemia ?  Paroxysmal atrial fibrillation (HCC) ?  Overweight (BMI 25.0-29.9) ? ?Resolved Problems: ?  * No resolved hospital problems. * ? ?Hospital Course: ?88 y.o. female with medical history significant for CHF, COPD, hepatitis C, dyslipidemia, coronary artery disease, hypertension, dyslipidemia and paroxysmal atrial fibrillation on Eliquis, who presented to the ER on 3/16 with acute onset of dyspnea and hypoxemia at her PCPs office with pulse ox of 82% on room air and patient found to have right upper lobe mass concerning for malignancy.  Also found to have severe sepsis felt to be from pneumonia as well as acute diastolic heart failure.  Oncology and pulmonary consulted.  Blood cultures noteworthy for ESBL E. coli and infectious disease consulted and patient started on meropenem, with patient needing 14 days of IV antibiotics.  Pulmonary took patient for bronchoscopy which preliminary appeared to be small cell lung carcinoma.  Samples taken and pathology pending.  Plan will be for patient to be discharged with midline and IV ertapenem daily x7 more days for total of 14 days of therapy ? ?Assessment and Plan: ?* Sepsis due to Escherichia coli (E. coli) ([place]) ?Patient met criteria for severe E. coli sepsis on admission due to patient met criteria for  severe sepsis on admission given fever, heart rate greater than 90 with pneumonia source, blood cultures growing out E. coli, hypotension and acute respiratory failure with hypoxia.  Continue IV meropenem per infectious disease for total of 14 days (transition to ertapenem on discharge by midline).  Sepsis now stabilized.  ? ?Mass of upper lobe of right lung ?- Appreciate pulmonary and oncology input.  Status post bronchoscopy with pathology pending although appears to be small cell lung carcinoma.  Patient will follow-up with oncology and pulmonary. ? ?Postobstructive pneumonia ?As above.  Being discharged on IV ertapenem through midline.  Home health being set up. ? ? ? ?Acute on chronic diastolic CHF (congestive heart failure) ([place]) ?Continue Lasix.  To date, patient has diuresed over 17 L and is -14 L deficient.  Echocardiogram from 10/21 notes grade 1 diastolic dysfunction with preserved ejection fraction ? ?Acute respiratory failure with hypoxia ([place]) ?Secondary to pneumonia, lung mass and acute on chronic diastolic heart failure.  Continue Lasix 40 mg IV daily as well as antibiotics. ?Net IO Since Admission: -14,647 mL [11/12/21 1446]  ?Currently on 2 L nasal cannula, her baseline ? ?Dyslipidemia ?- continue statin therapy ? ?Paroxysmal atrial fibrillation (HCC) ?Remained rate controlled.  Eliquis initially put on hold for bronch.  Resumed on discharge. ? ?Overweight (BMI 25.0-29.9) ?Meets criteria with BMI greater than 25 ? ?Bacteremia ?-Continue IV meropenem per ID due to ESBL E. coli growing in the blood.  Repeat blood culture from 3/19 are negative thus far ?-Likely source pulmonary ?- transition to ertapenem at discharge via midline to complete 14  day course per ID ? ? ? ? ?  ? ? ?Consultants: Pulmonary, oncology, infectious disease ?Procedures performed: Status post bronchoscopy done 3/22. ?Disposition: Home ?Diet recommendation:  ?Cardiac diet ?DISCHARGE MEDICATION: ?Allergies as of 11/12/2021   ? ?    Reactions  ? Flexeril [cyclobenzaprine Hcl]   ? Severe Rash  ? Vancomycin Rash  ? [place] Syndrome  ? ?  ? ?  ?Medication List  ?  ? ?TAKE these medications   ? ?acetaminophen 500 MG tablet ?Commonly known as: TYLENOL ?Take 500 mg by mouth in the morning and at bedtime. ?  ?albuterol (2.5 MG/3ML) 0.083% nebulizer solution ?Commonly known as: PROVENTIL ?USE 1 VIAL IN NEBULIZER EVERY 8 HOURS AS NEEDED FOR WHEEZING OR SHORTNESS OF BREATH. ?  ?apixaban 2.5 MG Tabs tablet ?Commonly known as: ELIQUIS ?Take 1 tablet (2.5 mg total) by mouth 2 (two) times daily. ?  ?atorvastatin 20 MG tablet ?Commonly known as: LIPITOR ?TAKE 1 TABLET BY MOUTH ONCE DAILY FOR CHOLESTEROL ?  ?bisacodyl 5 MG EC tablet ?Commonly known as: DULCOLAX ?Take 2 tablets (10 mg total) by mouth daily as needed for moderate constipation. ?  ?Breztri Aerosphere 160-9-4.8 MCG/ACT Aero ?Generic drug: Budeson-Glycopyrrol-Formoterol ?Inhale 2 puffs into the lungs 2 (two) times daily. ?  ?chlorpheniramine-HYDROcodone 10-8 MG/5ML ?Take 5 mLs by mouth every 12 (twelve) hours as needed for cough. ?  ?diclofenac Sodium 1 % Gel ?Commonly known as: Voltaren ?Apply 4 g topically 2 (two) times daily as needed (As needed for pain). ?  ?ertapenem  IVPB ?Commonly known as: INVANZ ?Inject 1 g into the vein daily for 7 days. Indication:  ESBL E coli bacteremia ?First Dose: No ?Last Day of Therapy:  11/19/2021 ?Labs - Once weekly:  CBC/D and BMP, ?Method of administration: Mini-Bag Plus / Gravity ?Method of administration may be changed at the discretion of home infusion pharmacist based upon assessment of the patient and/or caregiver's ability to self-administer the medication ordered. ?  ?feeding supplement Liqd ?Take 237 mLs by mouth 2 (two) times daily between meals. ?Start taking on: November 13, 2021 ?  ?furosemide 40 MG tablet ?Commonly known as: LASIX ?Take 1 tablet (40 mg total) by mouth daily. ?  ?gabapentin 300 MG capsule ?Commonly known as: NEURONTIN ?Take 1 capsule  (300 mg total) by mouth 3 (three) times daily. ?What changed:  ?medication strength ?when to take this ?  ?GNP Aspirin Low Dose 81 MG EC tablet ?Generic drug: aspirin ?TAKE 1 TABLET BY MOUTH ONCE DAILY. *SWALLOW WHOLE* ?  ?levocetirizine 5 MG tablet ?Commonly known as: XYZAL ?TAKE (1/2) TABLET BY MOUTH EVERY OTHER DAY. ?  ?Melatonin 10 MG Tabs ?Take 10 mg by mouth at bedtime. ?  ?multivitamin with minerals Tabs tablet ?Take 1 tablet by mouth daily. ?Start taking on: November 13, 2021 ?  ?oxyCODONE 5 MG immediate release tablet ?Commonly known as: Oxy IR/ROXICODONE ?Take 2 tablets (10 mg total) by mouth 2 (two) times daily. May also take 1 tablet (5 mg total) at bedtime as needed for severe pain. Must last 30 days.. ?  ?OXYGEN ?Inhale 2 L into the lungs daily. ?  ?rOPINIRole 2 MG tablet ?Commonly known as: REQUIP ?TAKE 2 TABLETS BY MOUTH AT BEDTIME ?  ?Vitamin D 50 MCG (2000 UT) tablet ?Take 2,000 Units by mouth daily. ?  ? ?  ? ?  ?  ? ? ?  ?Discharge Care Instructions  ?(From admission, onward)  ?  ? ? ?  ? ?  Start  Ordered  ? 11/12/21 0000  Change dressing on IV access line weekly and PRN  (Home infusion instructions - [facility] )       ? 11/12/21 1348  ? ?  ?  ? ?  ? ? ?Discharge Exam: ?Filed Weights  ? 11/05/21 1210 11/11/21 1216  ?Weight: 77.2 kg 71.7 kg  ? ?General: Alert and oriented x3, no acute distress ?Cardiovascular: Irregular rhythm, rate controlled ?Lungs: Decreased breath sounds throughout ? ?Condition at discharge: improving ? ?The results of significant diagnostics from this hospitalization (including imaging, microbiology, ancillary and laboratory) are listed below for reference.  ? ?Imaging Studies: ?DG Chest Port 1 View ? ?Result Date: 11/05/2021 ?CLINICAL DATA:  88 year old female with shortness of breath. EXAM: PORTABLE CHEST - 1 VIEW COMPARISON:  09/20/2021, 09/19/2021, 09/17/2021 FINDINGS: The mediastinal contours are within normal limits. Unchanged cardiomegaly. Unchanged  position of left chest wall, subclavian vein approach dual lead pacemaker. Atherosclerotic calcification of the aortic arch. Similar appearing cephalization of pulmonary vasculature. Similar appearing diffuse

## 2021-11-12 NOTE — TOC Progression Note (Signed)
Transition of Care (TOC) - Progression Note  ? ? ?Patient Details  ?Name: Kristin [name] ?MRN: [id number] ?Date of Birth: 1930/08/20 ? ?Transition of Care (TOC) CM/SW Contact  ?Pete Pelt, RN ?Phone Number: ?11/12/2021, 1:11 PM ? ?Clinical Narrative: Discharge today after midline placement.  [facility], Infusion services and patient aware.   ? ? ? ?Expected Discharge Plan: [place] ?Barriers to Discharge: Continued Medical Work up ? ?Expected Discharge Plan and Services ?Expected Discharge Plan: [place] ?  ?Discharge Planning Services: CM Consult ?Post Acute Care Choice: Home Health ?Living arrangements for the past 2 months: [place] ?                ?  ?  ?  ?  ?  ?HH Arranged: PT, OT ?[place] Agency: [place] ?Date HH Agency Contacted: 11/06/21 ?Time [place]: 0962 ?Representative spoke with at [place]: Georgina Snell ? ? ?Social Determinants of Health (SDOH) Interventions ?  ? ?Readmission Risk Interventions ? ?  11/06/2021  ?  4:09 PM 09/21/2021  ? 11:49 AM 05/25/2020  ? 12:32 PM  ?Readmission Risk Prevention Plan  ?Transportation Screening Complete Complete Complete  ?PCP or Specialist Appt within 3-5 Days  Complete Not Complete  ?Not Complete comments   Sunday discharge PCP not available  ?[place] or Home Care Consult  Complete Complete  ?Social Work Consult for [place] Planning/Counseling  Complete Complete  ?Palliative Care Screening  Not Applicable Not Applicable  ?Medication Review Press photographer) Complete Complete Complete  ?PCP or Specialist appointment within 3-5 days of discharge Complete    ?[place] or Home Care Consult Complete    ?SW Recovery Care/Counseling Consult Not Complete    ?SW Consult Not Complete Comments RNCM assigned to case    ?Palliative Care Screening Not Applicable    ?[place] Not Applicable    ? ? ?

## 2021-11-13 ENCOUNTER — Other Ambulatory Visit: Payer: Self-pay | Admitting: Anatomic Pathology & Clinical Pathology

## 2021-11-13 ENCOUNTER — Encounter: Payer: Self-pay | Admitting: *Deleted

## 2021-11-13 ENCOUNTER — Inpatient Hospital Stay: Payer: Medicare HMO | Admitting: Oncology

## 2021-11-13 DIAGNOSIS — R7881 Bacteremia: Secondary | ICD-10-CM | POA: Diagnosis not present

## 2021-11-13 LAB — CULTURE, BLOOD (ROUTINE X 2)
Culture: NO GROWTH
Culture: NO GROWTH
Special Requests: ADEQUATE
Special Requests: ADEQUATE

## 2021-11-13 LAB — CYTOLOGY - NON PAP

## 2021-11-13 LAB — SURGICAL PATHOLOGY

## 2021-11-15 NOTE — Progress Notes (Signed)
Patient: Kristin [name]  Service Category: E/M  Provider: Gaspar Cola, MD  ?DOB: 1930-01-10  DOS: 11/18/2021  Location: Office  ?MRN: [id number]  Setting: Ambulatory outpatient  Referring Provider: [name] *  ?Type: Established Patient  Specialty: Interventional Pain Management  PCP: Olin Hauser, DO  ?Location: Remote location  Delivery: TeleHealth    ? ?Virtual Encounter - Pain Management ?PROVIDER NOTE: Information contained herein reflects review and annotations entered in association with encounter. Interpretation of such information and data should be left to medically-trained personnel. Information provided to patient can be located elsewhere in the medical record under "Patient Instructions". Document created using STT-dictation technology, any transcriptional errors that may result from process are unintentional.  ?  ?Contact & Pharmacy ?Preferred: [phone] ?Home: [phone] (home) ?Mobile: [phone] (mobile) ?E-mail: [email or web address]  ?[place] [place] ?[place] ?[place] [address] ?Phone: [phone] Fax: [phone] ?  ?Pre-screening  ?Kristin [name] offered "in-person" vs "virtual" encounter. She indicated preferring virtual for this encounter.  ? ?Reason ?COVID-19*  Social distancing based on CDC and AMA recommendations.  ? ?I contacted ABRIANA SALTOS on 11/18/2021 via telephone.      I clearly identified myself as Gaspar Cola, MD. I verified that I was speaking with the correct person using two identifiers (Name: Kristin [name], and date of birth: [birth year]). ? ?Consent ?I sought verbal advanced consent from Kristin [name] for virtual visit interactions. I informed Kristin [name] of possible security and privacy concerns, risks, and limitations associated with providing "not-in-person" medical evaluation and management services. I also informed Ms. Nydam of the availability of "in-person" appointments.  Finally, I informed her that there would be a charge for the virtual visit and that she could be  personally, fully or partially, financially responsible for it. Kristin [name] expressed understanding and agreed to proceed.  ? ?Historic Elements   ?[name] is a 88 y.o. year old, female patient evaluated today after our last contact on 08/19/2021. Kristin [name]  has a past medical history of Acute postoperative pain (02/09/2018), Arrhythmia, sinus node (04/03/2014), Arthritis, Arthritis, Atrial fibrillation ([place]), Back pain, Bradycardia, Breast cancer ([place]) (2004), Cancer ([place]) (2004), CHF (congestive heart failure) ([place]), Chronic back pain, CKD (chronic kidney disease), COPD (chronic obstructive pulmonary disease) ([place]), COVID-19 (06/11/2020), Cystocele, Decubitus ulcers, Dehydration, Gout, Hematuria, Hepatitis C, Hyperlipidemia, Hypertension, Hypomagnesemia (06/25/2015), NSTEMI (non-ST elevated myocardial infarction) ([place]) (04/08/2021), OAB (overactive bladder), Overactive bladder, Overdose opiate, accidental or unintentional, sequela (10/02/2020), Restless leg, Shoulder pain, bilateral, Urinary frequency, UTI (lower urinary tract infection), and Vaginal atrophy. She also  has a past surgical history that includes Abdominal hysterectomy; Back surgery; Rotator cuff repair; Breast surgery; Cataract extraction w/PHACO (10/19/2011); Cataract extraction w/PHACO (11/02/2011); Carpal tunnel release; Joint replacement; Colon surgery; Pacemaker insertion (N/A, 12/24/2015); Cholecystectomy; Breast lumpectomy (Right, 2004); Insert / replace / remove pacemaker; Lower Extremity Angiography (Left, 10/14/2020); Lower Extremity Angiography (Right, 09/08/2021); and Video bronchoscopy with endobronchial navigation (Right, 11/11/2021). Kristin [name] has a current medication list which includes the following prescription(s): acetaminophen, albuterol, apixaban, atorvastatin, bisacodyl, breztri aerosphere, chlorpheniramine-hydrocodone, vitamin d,  diclofenac sodium, ertapenem, feeding supplement, furosemide, gabapentin, gnp aspirin low dose, levocetirizine, melatonin, multivitamin with minerals, oxygen-helium, ropinirole, and [START ON 11/23/2021] oxycodone. She  reports that she quit smoking about 22 years ago. Her smoking use included cigarettes. She has a 50.00 pack-year smoking history. She has never used smokeless tobacco. She reports current drug use. Drug: Oxycodone. She reports that  she does not drink alcohol. Kristin [name] is allergic to flexeril [cyclobenzaprine hcl] and vancomycin.  ? ?HPI  ?Today, she is being contacted for medication management.  Today I spoke to [name], and she indicated that Kristin [name] recently had an episode where her oxygen saturation went down and she was also found to have a UTI.  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  She has been scheduled for radiation therapy (x10).  She is also pending a CT and a PET scan to evaluate for possible metastatic disease.  Currently the patient is very weak and has began to have occupational therapy and physical therapy at home.  She was given some cough medicine containing hydrocodone and they noticed that when she takes that she gets "loopy".  They have indicated that they will be talking to their pneumologist to see if they can change their antitussive to 1 with no opioids.  Because of the effects seen with the addition of the hydrocodone, they have gone down to giving her only 1 oxycodone at bedtime.  Currently her schedule is full in dealing with this newly found cancer and this is the primary reason why they could not make it to today's appointment as a face-to-face encounter. ? ?RTCB: 12/23/2021 ?Nonopioids transferred 06/04/2020: Requip, MiraLAX, Dulcolax, and melatonin ? ?Pharmacotherapy Assessment  ? ?Opioid Analgesic: Oxycodone IR 10 mg, 1 tab PO BID (20 mg/day of  oxycodone) ?MME/day: 30 mg/day.  ? ?Monitoring: ?[place] PMP: PDMP reviewed during this encounter.       ?Pharmacotherapy: No side-effects or adverse reactions reported. ?Compliance: No problems identified. ?Effectiveness: Clinically acceptable. ?Plan: Refer to "POC". UDS:  ?Summary  ?Date Value Ref Range Status  ?03/04/2021 Note  Final  ?  Comment:  ?  ==================================================================== ?[employer] Select 13 (MW) ?==================================================================== ?Test                             Result       Flag       Units ? ?Drug Present and Declared for Prescription Verification ?  Oxymorphone                    270          EXPECTED   ng/mg creat ?  Noroxycodone                   317          EXPECTED   ng/mg creat ?   Oxymorphone and noroxycodone are expected metabolites of oxycodone. ?   Sources of oxycodone are scheduled prescription medications. ?   Oxymorphone is also available as a scheduled prescription medication. ? ?Drug Absent but Declared for Prescription Verification ?  Oxycodone                      Not Detected UNEXPECTED ng/mg creat ?   Oxycodone is almost always present in patients taking this drug ?   consistently.  Absence of oxycodone could be due to lapse of time ?   since the last dose or unusual pharmacokinetics (rapid metabolism). ? ?==================================================================== ?Test                      Result    Flag   Units  Ref Range ?  Creatinine              30               mg/dL      >=20 ?==================================================================== ?Declared Medications: ? The flagging and interpretation on this report are based on the ? following declared medications.  Unexpected results may arise from ? inaccuracies in the declared medications. ? ? **Note: The testing scope of this panel includes these medications: ? ? Oxycodone ? ? **Note: The testing scope of this panel does not include the ?  following reported medications: ? ? Acetaminophen ? Albuterol ? Apixaban (Eliquis) ? Aspirin ? Atorvastatin ? Bisacodyl ? Dextromethorphan (Mucinex DM) ? Furosemide ? Guaifenesin (Mucinex DM) ? Helium ? [place] Comment

## 2021-11-16 ENCOUNTER — Other Ambulatory Visit: Payer: Self-pay

## 2021-11-16 DIAGNOSIS — B9689 Other specified bacterial agents as the cause of diseases classified elsewhere: Secondary | ICD-10-CM | POA: Diagnosis not present

## 2021-11-16 LAB — CYTOLOGY - NON PAP

## 2021-11-16 NOTE — Patient Outreach (Signed)
[facility]) Care Management ? ?11/16/2021 ? ?[name] ?[birth year] ?[id number] ? ? ?Telephone Assessment ? ? ?Successful outreach call. Spoke with patient's granddaughter-Erika. She reports that patient is doing fairly well. [facility] services in place and has been out to see pt. Patient foes for oncology appt tomorrow. Family is administering IV abxs to patient. Denies any RN CM needs or concerns at this time.  ? ?Medications Reviewed Today   ? ? Reviewed by Hayden Pedro, RN (Registered Nurse) on 11/16/21 at [phone]  Med List Status: <None>  ? ?Medication Order Taking? Sig Documenting Provider Last Dose Status Informant  ?acetaminophen (TYLENOL) 500 MG tablet [id number] No Take 500 mg by mouth in the morning and at bedtime. [provider] 11/05/2021 Active Family Member  ?albuterol (PROVENTIL) (2.5 MG/3ML) 0.083% nebulizer solution [id number] No USE 1 VIAL IN NEBULIZER EVERY 8 HOURS AS NEEDED FOR WHEEZING OR SHORTNESS OF BREATH. Olin Hauser, DO 11/04/2021 Active Family Member  ?apixaban (ELIQUIS) 2.5 MG TABS tablet [id number] No Take 1 tablet (2.5 mg total) by mouth 2 (two) times daily. Loletha Grayer, MD 11/05/2021 Active Family Member  ?atorvastatin (LIPITOR) 20 MG tablet [id number] No TAKE 1 TABLET BY MOUTH ONCE DAILY FOR CHOLESTEROL Olin Hauser, DO 11/05/2021 Active Family Member  ?bisacodyl (DULCOLAX) 5 MG EC tablet [id number] No Take 2 tablets (10 mg total) by mouth daily as needed for moderate constipation. Olin Hauser, DO Past Week Active Family Member  ?BREZTRI AEROSPHERE 160-9-4.8 MCG/ACT AERO [id number] No Inhale 2 puffs into the lungs 2 (two) times daily. Olin Hauser, DO Taking Active Family Member  ?chlorpheniramine-HYDROcodone 10-8 MG/5ML [id number]  Take 5 mLs by mouth every 12 (twelve) hours as needed for cough. Annita Brod, MD  Active   ?Cholecalciferol (VITAMIN D) 2000 units tablet [id number] No Take 2,000 Units by  mouth daily.  [provider] 11/05/2021 Active Family Member  ?diclofenac Sodium (VOLTAREN) 1 % GEL [id number] No Apply 4 g topically 2 (two) times daily as needed (As needed for pain). Kris Hartmann, NP Past Week Active Family Member  ?ertapenem [facility]) IVPB [id number]  Inject 1 g into the vein daily for 7 days. Indication:  ESBL E coli bacteremia ?First Dose: No ?Last Day of Therapy:  11/19/2021 ?Labs - Once weekly:  CBC/D and BMP, ?Method of administration: Mini-Bag Plus / Gravity ?Method of administration may be changed at the discretion of home infusion pharmacist based upon assessment of the patient and/or caregiver's ability to self-administer the medication ordered. Annita Brod, MD  Active   ?feeding supplement (ENSURE ENLIVE / ENSURE PLUS) LIQD [id number]  Take 237 mLs by mouth 2 (two) times daily between meals. Annita Brod, MD  Active   ?furosemide (LASIX) 40 MG tablet [id number] No Take 1 tablet (40 mg total) by mouth daily. Ezekiel Slocumb, DO 11/05/2021 Active Family Member  ?gabapentin (NEURONTIN) 300 MG capsule [id number]  Take 1 capsule (300 mg total) by mouth 3 (three) times daily. Annita Brod, MD  Active   ?GNP ASPIRIN LOW DOSE 81 MG EC tablet [id number] No TAKE 1 TABLET BY MOUTH ONCE DAILY. *SWALLOW WHOLE* Schnier, Dolores Lory, MD 11/05/2021 Active Family Member  ?levocetirizine (XYZAL) 5 MG tablet [id number] No TAKE (1/2) TABLET BY MOUTH EVERY OTHER DAY. Olin Hauser, DO 11/04/2021 Active Family Member  ?Melatonin 10 MG TABS [id number] No Take 10 mg by mouth at bedtime. [provider] 11/04/2021 Active Family Member  ?Multiple Vitamin (MULTIVITAMIN WITH MINERALS)  TABS tablet [id number]  Take 1 tablet by mouth daily. Annita Brod, MD  Active   ?oxyCODONE (OXY IR/ROXICODONE) 5 MG immediate release tablet [id number] No Take 2 tablets (10 mg total) by mouth 2 (two) times daily. May also take 1 tablet (5 mg total) at bedtime as needed for severe pain. Must  last 30 days.Milinda Pointer, MD 11/05/2021 Active Family Member  ?         ?Med Note Dossie Arbour, FRANCISCO A   Wed Aug 19, 2021  1:52 PM) WARNING: Not a Duplicate. Future prescription. DO NOT DELETE during hospital medication reconciliation or at discharge. ?[facility] Chronic Pain Management Patient   ?OXYGEN [id number] No Inhale 2 L into the lungs daily. [provider] Taking Active Family Member  ?         ?Med Note (HILL, TIFFANY A   Tue Apr 25, 2018  3:19 PM) At night as needed  ?rOPINIRole (REQUIP) 2 MG tablet [id number] No TAKE 2 TABLETS BY MOUTH AT BEDTIME Olin Hauser, DO 11/04/2021 Active Family Member  ?Med List Note Janett Billow, RN 05/27/21 1552): MR 08/25/21 ?UDS7-13-2022  ? ?  ?  ? ?  ?  ?Care Plan : Heart Failure (Adult)  ?Updates made by Hayden Pedro, RN since 11/16/2021 12:00 AM  ?  ? ?Problem: Symptom Exacerbation (Heart Failure)   ?  ? ?Long-Range Goal: Symptom Exacerbation Prevented or Minimized-Patient will have no hospitalizations over the next 90 days Completed 11/16/2021  ?Start Date: 01/01/2021  ?Expected End Date: 04/21/2021  ?Priority: High  ?Note:   ? ?Notes:  ? ?01/01/21 ?RN CM discussed importance of adhering to treatment plan. ?RN CM reinforced to patient importance of daily wgt monitoring. ?RN CM reviewed wgt monitoring log ?RN CM discussed s/s of worsening condition and when to seek medical attention. ? ?01/07/60-YWVPXTGGY due to duplicate care plan and goals ?  ? ? ?Care Plan : Chronic Pain (Adult)  ?Updates made by Hayden Pedro, RN since 11/16/2021 12:00 AM  ?  ? ?Problem: Chronic Pain Management (Chronic Pain)   ?  ? ?Long-Range Goal: Chronic Pain Managed-Patient will report an acceptable level of pain over the next 90 days Completed 11/16/2021  ?Start Date: 01/01/2021  ?Expected End Date: 04/21/2021  ?Priority: High  ?Note:   ?   ?Notes:  ? ?01/01/2021 ?RN CM completed pain assessment. ?RN CM confirmed pt has MD f/u appt in place. ?RN  CM discussed non-pharmacological measures to aide in pain relief. ? ?11/16/21- Resolving due to duplicate care plan and goals ?  ? ? ?Care Plan : RN Care Manager POC  ?Updates made by Hayden Pedro, RN since 11/16/2021 12:00 AM  ?  ? ?Problem: Chronic Disease Mgmt of Chronic Condition-CHF,lung mass   ?Priority: High  ?  ? ?Long-Range Goal: Development of POC for Mgmt of Chronic Condition-CHF   ?Start Date: 11/16/2021  ?Expected End Date: 11/17/2022  ?Priority: High  ?Note:   ? ?Current Barriers:  ?Chronic Disease Management support and education needs related to CHF  ? ?RNCM Clinical Goal(s):  ?Patient will verbalize understanding of plan for management of CHF as evidenced by mgmt of chronic conditions ?continue to work with RN Care Manager to address care management and care coordination needs related to  CHF as evidenced by adherence to CM Team Scheduled appointments through collaboration with RN Care manager, provider, and care team.  ? ?Interventions: ?POC sent to PCP upon initial assessment, quarterly and with any changes in  patient's conditions ?Inter-disciplinary care team collaboration (see longitudinal plan of care) ?Evaluation of current treatment plan related to  self management and patient's adherence to plan as established by provider ? ? ?Heart Failure Interventions:  (Status:  New goal.) Long Term Goal ?Basic overview and discussion of pathophysiology of Heart Failure reviewed ?Provided education on low sodium diet ?Discussed the importance of keeping all appointments with provider ? ?11/16/21-Patient being treated with IV abx for recent infection/sepsis.  ? ?Patient Goals/Self-Care Activities: ?Take all medications as prescribed ?Attend all scheduled provider appointments ?Call provider office for new concerns or questions  ?call office if I gain more than 2 pounds in one day or 5 pounds in one week ?keep legs up while sitting ?track weight in diary ?use salt in moderation ?develop a rescue  plan ? ?Follow Up Plan:  Telephone follow up appointment with care management team member scheduled for:  within the month of May ?The patient has been provided with contact information for the care m

## 2021-11-17 ENCOUNTER — Encounter: Payer: Self-pay | Admitting: *Deleted

## 2021-11-17 ENCOUNTER — Ambulatory Visit
Admission: RE | Admit: 2021-11-17 | Discharge: 2021-11-17 | Disposition: A | Discharge status: Home / Self Care | Payer: Medicare HMO | Source: Ambulatory Visit | Attending: Radiation Oncology | Admitting: Radiation Oncology

## 2021-11-17 ENCOUNTER — Telehealth: Payer: Self-pay | Admitting: Family Medicine

## 2021-11-17 ENCOUNTER — Encounter: Payer: Self-pay | Admitting: Oncology

## 2021-11-17 ENCOUNTER — Inpatient Hospital Stay: Payer: Medicare HMO | Attending: Oncology | Admitting: Oncology

## 2021-11-17 ENCOUNTER — Other Ambulatory Visit: Payer: Self-pay

## 2021-11-17 VITALS — BP 107/63 | HR 62 | Temp 97.1°F | Resp 16 | Wt 164.5 lb

## 2021-11-17 DIAGNOSIS — N3281 Overactive bladder: Secondary | ICD-10-CM | POA: Insufficient documentation

## 2021-11-17 DIAGNOSIS — Z853 Personal history of malignant neoplasm of breast: Secondary | ICD-10-CM | POA: Insufficient documentation

## 2021-11-17 DIAGNOSIS — I509 Heart failure, unspecified: Secondary | ICD-10-CM | POA: Diagnosis not present

## 2021-11-17 DIAGNOSIS — C801 Malignant (primary) neoplasm, unspecified: Secondary | ICD-10-CM

## 2021-11-17 DIAGNOSIS — N189 Chronic kidney disease, unspecified: Secondary | ICD-10-CM | POA: Insufficient documentation

## 2021-11-17 DIAGNOSIS — C3490 Malignant neoplasm of unspecified part of unspecified bronchus or lung: Secondary | ICD-10-CM | POA: Diagnosis not present

## 2021-11-17 DIAGNOSIS — I252 Old myocardial infarction: Secondary | ICD-10-CM | POA: Insufficient documentation

## 2021-11-17 DIAGNOSIS — Z87891 Personal history of nicotine dependence: Secondary | ICD-10-CM | POA: Diagnosis not present

## 2021-11-17 DIAGNOSIS — G2581 Restless legs syndrome: Secondary | ICD-10-CM | POA: Diagnosis not present

## 2021-11-17 DIAGNOSIS — Z791 Long term (current) use of non-steroidal anti-inflammatories (NSAID): Secondary | ICD-10-CM | POA: Diagnosis not present

## 2021-11-17 DIAGNOSIS — Z79899 Other long term (current) drug therapy: Secondary | ICD-10-CM | POA: Diagnosis not present

## 2021-11-17 DIAGNOSIS — Z7189 Other specified counseling: Secondary | ICD-10-CM | POA: Diagnosis not present

## 2021-11-17 DIAGNOSIS — Z9981 Dependence on supplemental oxygen: Secondary | ICD-10-CM | POA: Diagnosis not present

## 2021-11-17 DIAGNOSIS — J449 Chronic obstructive pulmonary disease, unspecified: Secondary | ICD-10-CM | POA: Insufficient documentation

## 2021-11-17 DIAGNOSIS — M129 Arthropathy, unspecified: Secondary | ICD-10-CM | POA: Diagnosis not present

## 2021-11-17 DIAGNOSIS — Z803 Family history of malignant neoplasm of breast: Secondary | ICD-10-CM | POA: Diagnosis not present

## 2021-11-17 DIAGNOSIS — Z8616 Personal history of COVID-19: Secondary | ICD-10-CM | POA: Insufficient documentation

## 2021-11-17 DIAGNOSIS — C3411 Malignant neoplasm of upper lobe, right bronchus or lung: Secondary | ICD-10-CM | POA: Insufficient documentation

## 2021-11-17 DIAGNOSIS — Z87440 Personal history of urinary (tract) infections: Secondary | ICD-10-CM | POA: Insufficient documentation

## 2021-11-17 DIAGNOSIS — Z7901 Long term (current) use of anticoagulants: Secondary | ICD-10-CM | POA: Insufficient documentation

## 2021-11-17 DIAGNOSIS — C7A8 Other malignant neuroendocrine tumors: Secondary | ICD-10-CM | POA: Diagnosis not present

## 2021-11-17 DIAGNOSIS — Z9221 Personal history of antineoplastic chemotherapy: Secondary | ICD-10-CM | POA: Diagnosis not present

## 2021-11-17 DIAGNOSIS — I130 Hypertensive heart and chronic kidney disease with heart failure and stage 1 through stage 4 chronic kidney disease, or unspecified chronic kidney disease: Secondary | ICD-10-CM | POA: Diagnosis not present

## 2021-11-17 DIAGNOSIS — I4891 Unspecified atrial fibrillation: Secondary | ICD-10-CM | POA: Insufficient documentation

## 2021-11-17 DIAGNOSIS — Z993 Dependence on wheelchair: Secondary | ICD-10-CM | POA: Diagnosis not present

## 2021-11-17 NOTE — Telephone Encounter (Signed)
Okay to proceed with verbal orders ? ?[name], DO ?[facility] ?[facility] ?11/17/2021, 10:31 AM ? ?

## 2021-11-17 NOTE — Consult Note (Signed)
?NEW PATIENT EVALUATION ? ?Name: Kristin [name]  ?MRN: [id number]  ?Date:   11/17/2021     ?DOB: Jul 29, 1930 ? ? ?This 88 y.o. female patient presents to the clinic for initial evaluation of small cell lung cancer is yet not completely staged. ? ?REFERRING PHYSICIAN: [name] * ? ?CHIEF COMPLAINT:  ?Chief Complaint  ?Patient presents with  ? Consult  ?  Small Cell Lung cancer  ? ? ?DIAGNOSIS: The encounter diagnosis was Small cell carcinoma ([place]). ?  ?PREVIOUS INVESTIGATIONS:  ?CT scan reviewed PET CT scan and CT scan of the head and neck ordered ?Pathology and cytology report reviewed ?Clinical notes reviewed ? ?HPI: Patient is a 88 year old female with significant COPD oxygen dependent who was found to have a right lung mass as well as mediastinal adenopathy.  She underwent bronchoscopy with cytology showing small cell lung cancer.  CT scan shows posterior right upper lobe 4.8 cm solid lung mass suspicious for primary bronchogenic carcinoma.  There are also 2 small right lower lobe pulmonary nodules possibly inflammatory in nature.  She also has a dilated 4.4 cm ascending thoracic aorta.  She has a PET CT scan and CT scan of the head and neck ordered for next week.  She is seen today with multiple family members.  She has a mild cough and some slight hemoptysis.  She is wheelchair-bound on nasal oxygen. ? ?PLANNED TREATMENT REGIMEN: Palliative radiation therapy to chest ? ?PAST MEDICAL HISTORY:  has a past medical history of Acute postoperative pain (02/09/2018), Arrhythmia, sinus node (04/03/2014), Arthritis, Arthritis, Atrial fibrillation ([place]), Back pain, Bradycardia, Breast cancer ([place]) (2004), Cancer ([place]) (2004), CHF (congestive heart failure) ([place]), Chronic back pain, CKD (chronic kidney disease), COPD (chronic obstructive pulmonary disease) ([place]), COVID-19 (06/11/2020), Cystocele, Decubitus ulcers, Dehydration, Gout, Hematuria, Hepatitis C, Hyperlipidemia, Hypertension, Hypomagnesemia (06/25/2015),  NSTEMI (non-ST elevated myocardial infarction) ([place]) (04/08/2021), OAB (overactive bladder), Overactive bladder, Overdose opiate, accidental or unintentional, sequela (10/02/2020), Restless leg, Shoulder pain, bilateral, Urinary frequency, UTI (lower urinary tract infection), and Vaginal atrophy.   ? ?PAST SURGICAL HISTORY:  ?Past Surgical History:  ?Procedure Laterality Date  ? ABDOMINAL HYSTERECTOMY    ? BACK SURGERY    ? BREAST LUMPECTOMY Right 2004  ? BREAST SURGERY    ? rt lumpectomy  ? CARPAL TUNNEL RELEASE    ? CATARACT EXTRACTION W/PHACO  10/19/2011  ? Procedure: CATARACT EXTRACTION PHACO AND INTRAOCULAR LENS PLACEMENT (IOC);  Surgeon: Elta Guadeloupe T. Gershon Crane, MD;  Location: [facility];  Service: Ophthalmology;  Laterality: Right;  CDE:10.81  ? CATARACT EXTRACTION W/PHACO  11/02/2011  ? Procedure: CATARACT EXTRACTION PHACO AND INTRAOCULAR LENS PLACEMENT (IOC);  Surgeon: Elta Guadeloupe T. Gershon Crane, MD;  Location: [facility];  Service: Ophthalmology;  Laterality: Left;  CDE 8.60  ? CHOLECYSTECTOMY    ? 3/18  ? COLON SURGERY    ? INSERT / REPLACE / REMOVE PACEMAKER    ? JOINT REPLACEMENT    ? bilateral TKA  ? LOWER EXTREMITY ANGIOGRAPHY Left 10/14/2020  ? Procedure: LOWER EXTREMITY ANGIOGRAPHY;  Surgeon: Katha Cabal, MD;  Location: [facility];  Service: Cardiovascular;  Laterality: Left;  ? LOWER EXTREMITY ANGIOGRAPHY Right 09/08/2021  ? Procedure: LOWER EXTREMITY ANGIOGRAPHY;  Surgeon: Katha Cabal, MD;  Location: [facility];  Service: Cardiovascular;  Laterality: Right;  ? PACEMAKER INSERTION N/A 12/24/2015  ? Procedure: INSERTION PACEMAKER;  Surgeon: Isaias Cowman, MD;  Location: [facility];  Service: Cardiovascular;  Laterality: N/A;  ? ROTATOR CUFF REPAIR    ?  bilateral  ? VIDEO BRONCHOSCOPY WITH ENDOBRONCHIAL NAVIGATION Right 11/11/2021  ? Procedure: VIDEO BRONCHOSCOPY WITH ENDOBRONCHIAL NAVIGATION;  Surgeon: Ottie Glazier, MD;  Location: [facility];  Service: Thoracic;  Laterality: Right;  ? ? ?FAMILY  HISTORY: family history includes Breast cancer in her paternal aunt; Breast cancer (age of onset: 63) in her sister; Cancer in her sister; Heart disease in her father and mother; Kidney disease in her brother; Stroke in her mother. ? ?SOCIAL HISTORY:  reports that she quit smoking about 22 years ago. Her smoking use included cigarettes. She has a 50.00 pack-year smoking history. She has never used smokeless tobacco. She reports current drug use. Drug: Oxycodone. She reports that she does not drink alcohol. ? ?ALLERGIES: Flexeril [cyclobenzaprine hcl] and Vancomycin ? ?MEDICATIONS:  ?Current Outpatient Medications  ?Medication Sig Dispense Refill  ? acetaminophen (TYLENOL) 500 MG tablet Take 500 mg by mouth in the morning and at bedtime.    ? albuterol (PROVENTIL) (2.5 MG/3ML) 0.083% nebulizer solution USE 1 VIAL IN NEBULIZER EVERY 8 HOURS AS NEEDED FOR WHEEZING OR SHORTNESS OF BREATH. 75 mL 0  ? apixaban (ELIQUIS) 2.5 MG TABS tablet Take 1 tablet (2.5 mg total) by mouth 2 (two) times daily. 60 tablet 0  ? atorvastatin (LIPITOR) 20 MG tablet TAKE 1 TABLET BY MOUTH ONCE DAILY FOR CHOLESTEROL 30 tablet 11  ? bisacodyl (DULCOLAX) 5 MG EC tablet Take 2 tablets (10 mg total) by mouth daily as needed for moderate constipation. 180 tablet 1  ? BREZTRI AEROSPHERE 160-9-4.8 MCG/ACT AERO Inhale 2 puffs into the lungs 2 (two) times daily. 10.7 g 5  ? chlorpheniramine-HYDROcodone 10-8 MG/5ML Take 5 mLs by mouth every 12 (twelve) hours as needed for cough. 70 mL 0  ? Cholecalciferol (VITAMIN D) 2000 units tablet Take 2,000 Units by mouth daily.     ? diclofenac Sodium (VOLTAREN) 1 % GEL Apply 4 g topically 2 (two) times daily as needed (As needed for pain). 100 g 1  ? ertapenem (INVANZ) IVPB Inject 1 g into the vein daily for 7 days. Indication:  ESBL E coli bacteremia ?First Dose: No ?Last Day of Therapy:  11/19/2021 ?Labs - Once weekly:  CBC/D and BMP, ?Method of administration: Mini-Bag Plus / Gravity ?Method of administration  may be changed at the discretion of home infusion pharmacist based upon assessment of the patient and/or caregiver's ability to self-administer the medication ordered. 8 Units 0  ? feeding supplement (ENSURE ENLIVE / ENSURE PLUS) LIQD Take 237 mLs by mouth 2 (two) times daily between meals. 237 mL 12  ? furosemide (LASIX) 40 MG tablet Take 1 tablet (40 mg total) by mouth daily. 30 tablet 1  ? gabapentin (NEURONTIN) 300 MG capsule Take 1 capsule (300 mg total) by mouth 3 (three) times daily. 90 capsule 1  ? GNP ASPIRIN LOW DOSE 81 MG EC tablet TAKE 1 TABLET BY MOUTH ONCE DAILY. *SWALLOW WHOLE* 150 tablet 11  ? levocetirizine (XYZAL) 5 MG tablet TAKE (1/2) TABLET BY MOUTH EVERY OTHER DAY. 8 tablet 9  ? Melatonin 10 MG TABS Take 10 mg by mouth at bedtime.    ? Multiple Vitamin (MULTIVITAMIN WITH MINERALS) TABS tablet Take 1 tablet by mouth daily. 30 tablet 1  ? oxyCODONE (OXY IR/ROXICODONE) 5 MG immediate release tablet Take 2 tablets (10 mg total) by mouth 2 (two) times daily. May also take 1 tablet (5 mg total) at bedtime as needed for severe pain. Must last 30 days.. 150 tablet 0  ? OXYGEN  Inhale 2 L into the lungs daily.    ? rOPINIRole (REQUIP) 2 MG tablet TAKE 2 TABLETS BY MOUTH AT BEDTIME 180 tablet 1  ? ?No current facility-administered medications for this encounter.  ? ? ?ECOG PERFORMANCE STATUS:  1 - Symptomatic but completely ambulatory ? ?REVIEW OF SYSTEMS: ?Patient denies any weight loss, fatigue, weakness, fever, chills or night sweats. Patient denies any loss of vision, blurred vision. Patient denies any ringing  of the ears or hearing loss. No irregular heartbeat. Patient denies heart murmur or history of fainting. Patient denies any chest pain or pain radiating to her upper extremities. Patient denies any shortness of breath, difficulty breathing at night, cough or hemoptysis. Patient denies any swelling in the lower legs. Patient denies any nausea vomiting, vomiting of blood, or coffee ground  material in the vomitus. Patient denies any stomach pain. Patient states has had normal bowel movements no significant constipation or diarrhea. Patient denies any dysuria, hematuria or significant nocturia. P

## 2021-11-17 NOTE — Progress Notes (Signed)
Met with patient and her family during follow up visit and initial consultation with Dr. Baruch Gouty to discuss recent biopsy results and treatment options. All questions answered during visit. Reviewed upcoming appts. Contact info given and instructed to call with any questions or needs. Pt and her family verbalized understanding. ?

## 2021-11-17 NOTE — Telephone Encounter (Signed)
Mr. Kristin [name] is aware of recommendations to proceed with PT orders.  ?

## 2021-11-17 NOTE — Progress Notes (Signed)
? ? ? ?Hematology/Oncology Consult note ?[facility]  ?[phone] [phone] ? ?Patient Care Team: ?Olin Hauser, DO as PCP - General (Family Medicine) ?Olin Hauser, DO as Consulting Physician (Family Medicine) ?Isaias Cowman, MD as Consulting Physician (Cardiology) ?Milinda Pointer, MD as Referring Physician (Pain Medicine) ?Alisa Graff, FNP as Nurse Practitioner (Family Medicine) ?Delles, Virl Diamond, RPH-CPP as Pharmacist ?Florance, Tomasa Blase, RN as [facility] Management ?Telford Nab, RN as Sales executive  ? ?Name of the patient: Kristin [name]  ?[id number]  ?11/18/1929  ? ?Date of visit: 11/17/21 ? ?Diagnosis-new diagnosis of small cell lung cancer.  Staging pending ? ?Chief complaint/ Reason for visit- post hospital discharge follow-up ? ?Heme/Onc history: Patient is a 88 year old female with a history of CHF, COPD on home oxygen, paroxysmal A-fib on Eliquis as well as history of breast cancer about 20 years ago.  Back then she received chemotherapy for breast cancer which was poorly tolerated and was hospitalized for over 2 months.  States that her heart failure was possibly a result of chemotherapy as well.  She was recently admitted to the hospital for symptoms of worsening shortness of breath and a CT chest abdomen pelvis without contrast showed a 4.5 x 4.2 cm right upper lobe lung mass concerning for lung cancer.  Right upper lobe 0.5 cm nodule stable.  Left midlung 0.6 cm solid nodule.  2 indistinct peribronchovascular pulmonary nodules in the right lower lobe.  No other evidence of distant metastatic disease.  No evidence of hilar or mediastinal adenopathy noted on CT scan.  She underwent bronchoscopy with biopsy of the right upper lobe lung mass as well as station 10 R lymph node both of which were positive for metastatic small cell neuroendocrine carcinoma.   ? ? ?Interval history- She is  here for further management.  Patient lives with her son and requires assistance with her ADLs.  Her granddaughter also lives close by and is here with the patient for her visit today.  Reports baseline fatigue and exertional shortness of breath.  Appetite is poor ? ?ECOG PS- 3 ?Pain scale- 0 ? ?Review of systems- Review of Systems  ?Constitutional:  Positive for malaise/fatigue. Negative for chills, fever and weight loss.  ?HENT:  Negative for congestion, ear discharge and nosebleeds.   ?Eyes:  Negative for blurred vision.  ?Respiratory:  Negative for cough, hemoptysis, sputum production, shortness of breath and wheezing.   ?Cardiovascular:  Negative for chest pain, palpitations, orthopnea and claudication.  ?Gastrointestinal:  Negative for abdominal pain, blood in stool, constipation, diarrhea, heartburn, melena, nausea and vomiting.  ?Genitourinary:  Negative for dysuria, flank pain, frequency, hematuria and urgency.  ?Musculoskeletal:  Negative for back pain, joint pain and myalgias.  ?Skin:  Negative for rash.  ?Neurological:  Negative for dizziness, tingling, focal weakness, seizures, weakness and headaches.  ?Endo/Heme/Allergies:  Does not bruise/bleed easily.  ?Psychiatric/Behavioral:  Negative for depression and suicidal ideas. The patient does not have insomnia.   ? ?Allergies  ?Allergen Reactions  ? Flexeril [Cyclobenzaprine Hcl]   ?  Severe Rash  ? Vancomycin Rash  ?  [place] Syndrome  ? ? ? ?Past Medical History:  ?Diagnosis Date  ? Acute postoperative pain 02/09/2018  ? Arrhythmia, sinus node 04/03/2014  ? Arthritis   ? Arthritis   ? Atrial fibrillation ([place])   ? Back pain   ? lower back chronic  ? Bradycardia   ? Breast cancer ([place]) 2004  ? right  breast cancer  ? Cancer [facility]) 2004  ? rt breast cancer-post lumpectomy- chemo/rad  ? CHF (congestive heart failure) ([place])   ? Chronic back pain   ? CKD (chronic kidney disease)   ? COPD (chronic obstructive pulmonary disease) ([place])   ? wears O2 at 2L via [place] at  night  ? COVID-19 06/11/2020  ? Cystocele   ? Decubitus ulcers   ? Dehydration   ? Gout   ? Hematuria   ? Hepatitis C   ? Hyperlipidemia   ? Hypertension   ? Hypomagnesemia 06/25/2015  ? NSTEMI (non-ST elevated myocardial infarction) ([place]) 04/08/2021  ? OAB (overactive bladder)   ? Overactive bladder   ? Overdose opiate, accidental or unintentional, sequela 10/02/2020  ? Restless leg   ? Shoulder pain, bilateral   ? Urinary frequency   ? UTI (lower urinary tract infection)   ? Vaginal atrophy   ? ? ? ?Past Surgical History:  ?Procedure Laterality Date  ? ABDOMINAL HYSTERECTOMY    ? BACK SURGERY    ? BREAST LUMPECTOMY Right 2004  ? BREAST SURGERY    ? rt lumpectomy  ? CARPAL TUNNEL RELEASE    ? CATARACT EXTRACTION W/PHACO  10/19/2011  ? Procedure: CATARACT EXTRACTION PHACO AND INTRAOCULAR LENS PLACEMENT (IOC);  Surgeon: Elta Guadeloupe T. Gershon Crane, MD;  Location: [facility];  Service: Ophthalmology;  Laterality: Right;  CDE:10.81  ? CATARACT EXTRACTION W/PHACO  11/02/2011  ? Procedure: CATARACT EXTRACTION PHACO AND INTRAOCULAR LENS PLACEMENT (IOC);  Surgeon: Elta Guadeloupe T. Gershon Crane, MD;  Location: [facility];  Service: Ophthalmology;  Laterality: Left;  CDE 8.60  ? CHOLECYSTECTOMY    ? 3/18  ? COLON SURGERY    ? INSERT / REPLACE / REMOVE PACEMAKER    ? JOINT REPLACEMENT    ? bilateral TKA  ? LOWER EXTREMITY ANGIOGRAPHY Left 10/14/2020  ? Procedure: LOWER EXTREMITY ANGIOGRAPHY;  Surgeon: Katha Cabal, MD;  Location: [facility];  Service: Cardiovascular;  Laterality: Left;  ? LOWER EXTREMITY ANGIOGRAPHY Right 09/08/2021  ? Procedure: LOWER EXTREMITY ANGIOGRAPHY;  Surgeon: Katha Cabal, MD;  Location: [facility];  Service: Cardiovascular;  Laterality: Right;  ? PACEMAKER INSERTION N/A 12/24/2015  ? Procedure: INSERTION PACEMAKER;  Surgeon: Isaias Cowman, MD;  Location: [facility];  Service: Cardiovascular;  Laterality: N/A;  ? ROTATOR CUFF REPAIR    ? bilateral  ? VIDEO BRONCHOSCOPY WITH ENDOBRONCHIAL NAVIGATION Right  11/11/2021  ? Procedure: VIDEO BRONCHOSCOPY WITH ENDOBRONCHIAL NAVIGATION;  Surgeon: Ottie Glazier, MD;  Location: [facility];  Service: Thoracic;  Laterality: Right;  ? ? ?Social History  ? ?Socioeconomic History  ? Marital status: Single  ?  Spouse name: Not on file  ? Number of children: 2  ? Years of education: 23  ? Highest education level: 12th grade  ?Occupational History  ? Occupation: retired  ?Tobacco Use  ? Smoking status: Former  ?  Packs/day: 1.25  ?  Years: 40.00  ?  Pack years: 50.00  ?  Types: Cigarettes  ?  Quit date: 12/16/1998  ?  Years since quitting: 22.9  ? Smokeless tobacco: Never  ?Vaping Use  ? Vaping Use: Never used  ?Substance and Sexual Activity  ? Alcohol use: No  ?  Alcohol/week: 0.0 standard drinks  ? Drug use: Yes  ?  Types: Oxycodone  ? Sexual activity: Not Currently  ?  Birth control/protection: Surgical  ?Other Topics Concern  ? Not on file  ?Social History Narrative  ? Not on file  ? ?  Social Determinants of Health  ? ?Financial Resource Strain: Not on file  ?Food Insecurity: No Food Insecurity  ? Worried About Charity fundraiser in the Last Year: Never true  ? Ran Out of Food in the Last Year: Never true  ?Transportation Needs: No Transportation Needs  ? Lack of Transportation (Medical): No  ? Lack of Transportation (Non-Medical): No  ?Physical Activity: Not on file  ?Stress: Not on file  ?Social Connections: Not on file  ?Intimate Partner Violence: Not on file  ? ? ?Family History  ?Problem Relation Age of Onset  ? Kidney disease Brother   ? Heart disease Father   ? Heart disease Mother   ? Stroke Mother   ? Cancer Sister   ? Breast cancer Sister 32  ? Breast cancer Paternal Aunt   ? Anesthesia problems Neg Hx   ? Hypotension Neg Hx   ? Malignant hyperthermia Neg Hx   ? Pseudochol deficiency Neg Hx   ? ? ? ?Current Outpatient Medications:  ?  acetaminophen (TYLENOL) 500 MG tablet, Take 500 mg by mouth in the morning and at bedtime., Disp: , Rfl:  ?  albuterol (PROVENTIL) (2.5  MG/3ML) 0.083% nebulizer solution, USE 1 VIAL IN NEBULIZER EVERY 8 HOURS AS NEEDED FOR WHEEZING OR SHORTNESS OF BREATH., Disp: 75 mL, Rfl: 0 ?  apixaban (ELIQUIS) 2.5 MG TABS tablet, Take 1 tablet (2.5 mg

## 2021-11-17 NOTE — Telephone Encounter (Signed)
Home Health Verbal Orders - Caller/Agency: Mr. Kristin [name] / Alvis Lemmings  ?Callback Number: [id number] vm can be left ?Requesting PT  ?Frequency: 2x's a week for 2 weeks ?0x a week for 1 week ?2x's a week for 1 week  ?1x a week for 3 weeks  ? ?Diagnoses sepsis due to ecoli  ?Hospitalized from 3.16.23.-3.23.23 ?

## 2021-11-18 ENCOUNTER — Ambulatory Visit: Payer: Medicare HMO | Attending: Pain Medicine | Admitting: Pain Medicine

## 2021-11-18 ENCOUNTER — Telehealth: Payer: Self-pay | Admitting: Family Medicine

## 2021-11-18 DIAGNOSIS — Z79899 Other long term (current) drug therapy: Secondary | ICD-10-CM

## 2021-11-18 DIAGNOSIS — M47816 Spondylosis without myelopathy or radiculopathy, lumbar region: Secondary | ICD-10-CM | POA: Diagnosis not present

## 2021-11-18 DIAGNOSIS — G894 Chronic pain syndrome: Secondary | ICD-10-CM | POA: Diagnosis not present

## 2021-11-18 DIAGNOSIS — M5442 Lumbago with sciatica, left side: Secondary | ICD-10-CM

## 2021-11-18 DIAGNOSIS — M5412 Radiculopathy, cervical region: Secondary | ICD-10-CM | POA: Diagnosis not present

## 2021-11-18 DIAGNOSIS — M47812 Spondylosis without myelopathy or radiculopathy, cervical region: Secondary | ICD-10-CM

## 2021-11-18 DIAGNOSIS — M79604 Pain in right leg: Secondary | ICD-10-CM | POA: Diagnosis not present

## 2021-11-18 DIAGNOSIS — G8929 Other chronic pain: Secondary | ICD-10-CM

## 2021-11-18 DIAGNOSIS — M542 Cervicalgia: Secondary | ICD-10-CM | POA: Diagnosis not present

## 2021-11-18 DIAGNOSIS — M533 Sacrococcygeal disorders, not elsewhere classified: Secondary | ICD-10-CM

## 2021-11-18 DIAGNOSIS — M5441 Lumbago with sciatica, right side: Secondary | ICD-10-CM

## 2021-11-18 DIAGNOSIS — M25512 Pain in left shoulder: Secondary | ICD-10-CM

## 2021-11-18 DIAGNOSIS — Z79891 Long term (current) use of opiate analgesic: Secondary | ICD-10-CM

## 2021-11-18 DIAGNOSIS — M79605 Pain in left leg: Secondary | ICD-10-CM

## 2021-11-18 DIAGNOSIS — M25559 Pain in unspecified hip: Secondary | ICD-10-CM

## 2021-11-18 DIAGNOSIS — C3490 Malignant neoplasm of unspecified part of unspecified bronchus or lung: Secondary | ICD-10-CM

## 2021-11-18 DIAGNOSIS — M961 Postlaminectomy syndrome, not elsewhere classified: Secondary | ICD-10-CM

## 2021-11-18 DIAGNOSIS — M25511 Pain in right shoulder: Secondary | ICD-10-CM

## 2021-11-18 MED ORDER — OXYCODONE HCL 5 MG PO TABS: ORAL_TABLET | ORAL | 0 refills | Status: DC

## 2021-11-18 NOTE — Patient Instructions (Signed)
____________________________________________________________________________________________ ? ?Medication Rules ? ?Purpose: To inform patients, and their family members, of our rules and regulations. ? ?Applies to: All patients receiving prescriptions (written or electronic). ? ?Pharmacy of record: Pharmacy where electronic prescriptions will be sent. If written prescriptions are taken to a different pharmacy, please inform the nursing staff. The pharmacy listed in the electronic medical record should be the one where you would like electronic prescriptions to be sent. ? ?Electronic prescriptions: In compliance with the [place] Strengthen Opioid Misuse Prevention (STOP) Act of 2017 (Session Law 2017-74/H243), effective August 23, 2018, all controlled substances must be electronically prescribed. Calling prescriptions to the pharmacy will cease to exist. ? ?Prescription refills: Only during scheduled appointments. Applies to all prescriptions. ? ?NOTE: The following applies primarily to controlled substances (Opioid* Pain Medications).  ? ?Type of encounter (visit): For patients receiving controlled substances, face-to-face visits are required. (Not an option or up to the patient.) ? ?Patient's responsibilities: ?Pain Pills: Bring all pain pills to every appointment (except for procedure appointments). ?Pill Bottles: Bring pills in original pharmacy bottle. Always bring the newest bottle. Bring bottle, even if empty. ?Medication refills: You are responsible for knowing and keeping track of what medications you take and those you need refilled. ?The day before your appointment: write a list of all prescriptions that need to be refilled. ?The day of the appointment: give the list to the admitting nurse. Prescriptions will be written only during appointments. No prescriptions will be written on procedure days. ?If you forget a medication: it will not be "Called in", "Faxed", or "electronically sent". You will  need to get another appointment to get these prescribed. ?No early refills. Do not call asking to have your prescription filled early. ?Prescription Accuracy: You are responsible for carefully inspecting your prescriptions before leaving our office. Have the discharge nurse carefully go over each prescription with you, before taking them home. Make sure that your name is accurately spelled, that your address is correct. Check the name and dose of your medication to make sure it is accurate. Check the number of pills, and the written instructions to make sure they are clear and accurate. Make sure that you are given enough medication to last until your next medication refill appointment. ?Taking Medication: Take medication as prescribed. When it comes to controlled substances, taking less pills or less frequently than prescribed is permitted and encouraged. ?Never take more pills than instructed. ?Never take medication more frequently than prescribed.  ?Inform other Doctors: Always inform, all of your healthcare providers, of all the medications you take. ?Pain Medication from other Providers: You are not allowed to accept any additional pain medication from any other Doctor or Healthcare provider. There are two exceptions to this rule. (see below) In the event that you require additional pain medication, you are responsible for notifying us, as stated below. ?Cough Medicine: Often these contain an opioid, such as codeine or hydrocodone. Never accept or take cough medicine containing these opioids if you are already taking an opioid* medication. The combination may cause respiratory failure and death. ?Medication Agreement: You are responsible for carefully reading and following our Medication Agreement. This must be signed before receiving any prescriptions from our practice. Safely store a copy of your signed Agreement. Violations to the Agreement will result in no further prescriptions. (Additional copies of our  Medication Agreement are available upon request.) ?Laws, Rules, & Regulations: All patients are expected to follow all Federal and State Laws, Statutes, Rules, & Regulations. Ignorance of   the Laws does not constitute a valid excuse.  ?Illegal drugs and Controlled Substances: The use of illegal substances (including, but not limited to marijuana and its derivatives) and/or the illegal use of any controlled substances is strictly prohibited. Violation of this rule may result in the immediate and permanent discontinuation of any and all prescriptions being written by our practice. The use of any illegal substances is prohibited. ?Adopted CDC guidelines & recommendations: Target dosing levels will be at or below 60 MME/day. Use of benzodiazepines** is not recommended. ? ?Exceptions: There are only two exceptions to the rule of not receiving pain medications from other Healthcare Providers. ?Exception #1 (Emergencies): In the event of an emergency (i.e.: accident requiring emergency care), you are allowed to receive additional pain medication. However, you are responsible for: As soon as you are able, call our office [phone], at any time of the day or night, and leave a message stating your name, the date and nature of the emergency, and the name and dose of the medication prescribed. In the event that your call is answered by a member of our staff, make sure to document and save the date, time, and the name of the person that took your information.  ?Exception #2 (Planned Surgery): In the event that you are scheduled by another doctor or dentist to have any type of surgery or procedure, you are allowed (for a period no longer than 30 days), to receive additional pain medication, for the acute post-op pain. However, in this case, you are responsible for picking up a copy of our "Post-op Pain Management for Surgeons" handout, and giving it to your surgeon or dentist. This document is available at our office, and  does not require an appointment to obtain it. Simply go to our office during business hours (Monday-Thursday from 8:00 AM to 4:00 PM) (Friday 8:00 AM to 12:00 Noon) or if you have a scheduled appointment with us, prior to your surgery, and ask for it by name. In addition, you are responsible for: calling our office [phone], at any time of the day or night, and leaving a message stating your name, name of your surgeon, type of surgery, and date of procedure or surgery. Failure to comply with your responsibilities may result in termination of therapy involving the controlled substances. ?Medication Agreement Violation. Following the above rules, including your responsibilities will help you in avoiding a Medication Agreement Violation (?Breaking your Pain Medication Contract?). ? ?*Opioid medications include: morphine, codeine, oxycodone, oxymorphone, hydrocodone, hydromorphone, meperidine, tramadol, tapentadol, buprenorphine, fentanyl, methadone. ?**Benzodiazepine medications include: diazepam (Valium), alprazolam (Xanax), clonazepam (Klonopine), lorazepam (Ativan), clorazepate (Tranxene), chlordiazepoxide (Librium), estazolam (Prosom), oxazepam (Serax), temazepam (Restoril), triazolam (Halcion) ?(Last updated: 05/20/2021) ?____________________________________________________________________________________________ ? ____________________________________________________________________________________________ ? ?Medication Recommendations and Reminders ? ?Applies to: All patients receiving prescriptions (written and/or electronic). ? ?Medication Rules & Regulations: These rules and regulations exist for your safety and that of others. They are not flexible and neither are we. Dismissing or ignoring them will be considered "non-compliance" with medication therapy, resulting in complete and irreversible termination of such therapy. (See document titled "Medication Rules" for more details.) In all conscience,  because of safety reasons, we cannot continue providing a therapy where the patient does not follow instructions. ? ?Pharmacy of record:  ?Definition: This is the pharmacy where your electronic prescriptions w

## 2021-11-18 NOTE — Telephone Encounter (Signed)
Kristin [name] is aware that the orders are ok to proceed.  ?

## 2021-11-18 NOTE — Telephone Encounter (Signed)
Okay to proceed to verbal orders ? ?[name], DO ?[facility] ?[facility] ?11/18/2021, 3:07 PM ? ?

## 2021-11-18 NOTE — Progress Notes (Signed)
Kristin [name] was just recently released from hospital being treated for URI.  While in the hospital they discovered that she has lung cancer stage 2.  She is undergoing treatment and will begin radiation soon.  ?

## 2021-11-18 NOTE — Telephone Encounter (Signed)
Home Health Verbal Orders - Caller/Agency: [facility] ? ?Callback Number: [phone] ? ?Requesting OT/PT/Skilled Nursing/Social Work/Speech Therapy: OT ? ?Frequency:  ?1w2, Hold-1w1, 2w1, Hold-1w1, 2w1  ? ?

## 2021-11-19 ENCOUNTER — Other Ambulatory Visit: Payer: Medicare HMO

## 2021-11-19 NOTE — Progress Notes (Signed)
Tumor Board Documentation ? ?Kristin [name] was presented by Dr Janese Banks at our Tumor Board on 11/19/2021, which included representatives from medical oncology, surgical, pulmonology, genetics, radiology, pathology, radiation oncology, navigation, research, palliative care, internal medicine. ? ?Kristin [name] currently presents as a new patient, for [place], for new positive pathology with history of the following treatments: surgical intervention(s) (Remote h/o Breast cancer treated withradiation therapy, intolerable to chemotherapy). ? ?Additionally, we reviewed previous medical and familial history, history of present illness, and recent lab results along with all available histopathologic and imaging studies. The tumor board considered available treatment options and made the following recommendations: ?Additional screening, Radiation therapy (primary modality) (MRI Brain, PET Scan) ?  ? ?The following procedures/referrals were also placed: No orders of the defined types were placed in this encounter. ? ? ?Clinical Trial Status: not discussed  ? 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 ? ?National site-specific guidelines NCCN were discussed with respect to the case. ? ?Tumor board is a meeting of clinicians from various specialty areas who evaluate and discuss patients for whom a multidisciplinary approach is being considered. Final determinations in the plan of care are those of the provider(s). The responsibility for follow up of recommendations given during tumor board is that of the provider.  ? ?Today?s extended care, comprehensive team conference, Kristin [name] was not present for the discussion and was not examined.  ? ?Multidisciplinary Tumor Board is a multidisciplinary case peer review process.  Decisions discussed in the Multidisciplinary Tumor Board reflect the opinions of the specialists present at the conference without having examined the patient.   Ultimately, treatment and diagnostic decisions rest with the primary provider(s) and the patient. ? ?

## 2021-11-20 ENCOUNTER — Telehealth: Payer: Self-pay | Admitting: Family Medicine

## 2021-11-20 ENCOUNTER — Emergency Department: Payer: Medicare HMO

## 2021-11-20 ENCOUNTER — Emergency Department
Admission: EM | Admit: 2021-11-20 | Discharge: 2021-11-20 | Disposition: A | Discharge status: Home / Self Care | Payer: Medicare HMO | Attending: Emergency Medicine | Admitting: Emergency Medicine

## 2021-11-20 ENCOUNTER — Other Ambulatory Visit: Payer: Self-pay

## 2021-11-20 DIAGNOSIS — S0990XA Unspecified injury of head, initial encounter: Secondary | ICD-10-CM | POA: Diagnosis present

## 2021-11-20 DIAGNOSIS — S0101XA Laceration without foreign body of scalp, initial encounter: Secondary | ICD-10-CM | POA: Diagnosis not present

## 2021-11-20 DIAGNOSIS — S0003XA Contusion of scalp, initial encounter: Secondary | ICD-10-CM | POA: Diagnosis not present

## 2021-11-20 DIAGNOSIS — I4891 Unspecified atrial fibrillation: Secondary | ICD-10-CM | POA: Insufficient documentation

## 2021-11-20 DIAGNOSIS — I10 Essential (primary) hypertension: Secondary | ICD-10-CM | POA: Diagnosis not present

## 2021-11-20 DIAGNOSIS — R296 Repeated falls: Secondary | ICD-10-CM

## 2021-11-20 DIAGNOSIS — R58 Hemorrhage, not elsewhere classified: Secondary | ICD-10-CM | POA: Diagnosis not present

## 2021-11-20 DIAGNOSIS — Y92009 Unspecified place in unspecified non-institutional (private) residence as the place of occurrence of the external cause: Secondary | ICD-10-CM | POA: Diagnosis not present

## 2021-11-20 DIAGNOSIS — W01198A Fall on same level from slipping, tripping and stumbling with subsequent striking against other object, initial encounter: Secondary | ICD-10-CM | POA: Insufficient documentation

## 2021-11-20 DIAGNOSIS — I110 Hypertensive heart disease with heart failure: Secondary | ICD-10-CM | POA: Diagnosis not present

## 2021-11-20 DIAGNOSIS — J449 Chronic obstructive pulmonary disease, unspecified: Secondary | ICD-10-CM | POA: Insufficient documentation

## 2021-11-20 DIAGNOSIS — I739 Peripheral vascular disease, unspecified: Secondary | ICD-10-CM

## 2021-11-20 DIAGNOSIS — I509 Heart failure, unspecified: Secondary | ICD-10-CM | POA: Diagnosis not present

## 2021-11-20 DIAGNOSIS — W19XXXA Unspecified fall, initial encounter: Secondary | ICD-10-CM | POA: Diagnosis not present

## 2021-11-20 DIAGNOSIS — R531 Weakness: Secondary | ICD-10-CM

## 2021-11-20 DIAGNOSIS — J432 Centrilobular emphysema: Secondary | ICD-10-CM

## 2021-11-20 DIAGNOSIS — I5032 Chronic diastolic (congestive) heart failure: Secondary | ICD-10-CM

## 2021-11-20 MED ORDER — LIDOCAINE-EPINEPHRINE-TETRACAINE (LET) TOPICAL GEL
3.0000 mL | Freq: Once | TOPICAL | Status: AC
  Administered 2021-11-20: 3 mL via TOPICAL
  Filled 2021-11-20: qty 3

## 2021-11-20 NOTE — ED Provider Notes (Signed)
? ?[facility] ?Provider Note ? ? ? Event Date/Time  ? First MD Initiated Contact with Patient 11/20/21 [phone]   ?  (approximate) ? ? ?History  ? ?Fall ? ? ?HPI ? ?Kristin [name] is a 88 y.o. female presents to the ED via EMS after a fall at home.  Patient states that she lost her balance and fell.  She states she hit her head on the corner of the wall.  Patient denies any loss of consciousness however patient does have a laceration to the top of her head with bleeding controlled at this time.  There is been no nausea or vomiting and she states there is no visual changes.  Patient has history of COPD requiring O2, CHF, atrial fibs, chronic kidney disease, hypertension, non-STEMI MI 2022.  Family members are also present with patient. ?  ? ? ?Physical Exam  ? ?Triage Vital Signs: ?ED Triage Vitals  ?Enc Vitals Group  ?   BP 11/20/21 0819 116/64  ?   Pulse Rate 11/20/21 0819 (!) 59  ?   Resp 11/20/21 0819 18  ?   Temp 11/20/21 0819 98 ?F (36.7 ?C)  ?   Temp src --   ?   SpO2 11/20/21 0819 100 %  ?   Weight --   ?   Height --   ?   Head Circumference --   ?   Peak Flow --   ?   Pain Score 11/20/21 0818 0  ?   Pain Loc --   ?   Pain Edu? --   ?   Excl. in [place]? --   ? ? ?Most recent vital signs: ?Vitals:  ? 11/20/21 0819  ?BP: 116/64  ?Pulse: (!) 59  ?Resp: 18  ?Temp: 98 ?F (36.7 ?C)  ?SpO2: 100%  ? ? ? ?General: Awake, no distress.  Alert, talkative, cooperative during exam. ?CV:  Good peripheral perfusion.  Heart regular rate and rhythm. ?Resp:  Normal effort.  Lungs are clear bilaterally.  No tenderness on palpation of the ribs bilaterally. ?Abd:  No distention.  Soft, nontender, bowel sounds normoactive x4 quadrants. ?Other:  Patient is able move upper and lower extremities without any difficulty.  She denies any tenderness on palpation of the cervical, thoracic or lumbar spine.  Other than the laceration noted on her scalp superiorly there are no other abrasions or lacerations noted during  exam. ? ? ?ED Results / Procedures / Treatments  ? ?Labs ?(all labs ordered are listed, but only abnormal results are displayed) ?Labs Reviewed - No data to display ? ? ?RADIOLOGY ?CT head per radiologist was negative for any acute intracranial injury. ? ? ? ?PROCEDURES: ? ?Critical Care performed:  ? ?Marland Kitchen.Laceration Repair ? ?Date/Time: 11/20/2021 10:00 AM ?Performed by: Johnn Hai, PA-C ?Authorized by: Johnn Hai, PA-C  ? ?Consent:  ?  Consent obtained:  Verbal ?  Consent given by:  Patient ?  Risks discussed:  Pain, poor wound healing and infection ?Anesthesia:  ?  Anesthesia method:  Topical application ?  Topical anesthetic:  LET ?Laceration details:  ?  Location:  Scalp ?  Scalp location:  Crown ?  Length (cm):  1 ?Pre-procedure details:  ?  Preparation:  Imaging obtained to evaluate for foreign bodies ?Exploration:  ?  Hemostasis achieved with:  Direct pressure ?  Contaminated: no   ?Treatment:  ?  Area cleansed with:  Saline ?  Amount of cleaning:  Standard ?  Irrigation  method:  Tap ?  Visualized foreign bodies/material removed: no   ?Skin repair:  ?  Repair method:  Staples ?  Number of staples:  2 ?Approximation:  ?  Approximation:  Close ?Repair type:  ?  Repair type:  Simple ?Post-procedure details:  ?  Dressing:  Open (no dressing) ? ? ?MEDICATIONS ORDERED IN ED: ?Medications  ?lidocaine-EPINEPHrine-tetracaine (LET) topical gel (3 mLs Topical Given by Other 11/20/21 1003)  ? ? ? ?IMPRESSION / MDM / ASSESSMENT AND PLAN / ED COURSE  ?I reviewed the triage vital signs and the nursing notes. ? ? ?Differential diagnosis includes, but is not limited to, laceration scalp, possible head injury secondary to fall. ? ?87 year old female is brought to the ED by EMS after patient fell at home with a laceration to her scalp.  Patient denies any loss of consciousness and head CT was reassuring with no acute intracranial injury.  Patient family was also reassured.  Area was stapled with 2 staples and closed  easily.  In speaking with family patient lives alone but does have some limited home health.  They state that her care is becoming more difficult for family members to be able to provide for her.  A consult was placed for the caseworker to come talk with the family.  Consult for OT, PT, nurse aide/home health was placed for evaluation when she returns home.  Patient continued to be very spry for her age and entertaining.  Family was made aware that the staples would need to be removed in 7 days. ? ? ? ?  ? ? ?FINAL CLINICAL IMPRESSION(S) / ED DIAGNOSES  ? ?Final diagnoses:  ?Laceration of scalp, initial encounter  ?Fall in home, initial encounter  ? ? ? ?Rx / DC Orders  ? ?ED Discharge Orders   ? ? None  ? ?  ? ? ? ?Note:  This document was prepared using Dragon voice recognition software and may include unintentional dictation errors. ?  ?Johnn Hai, PA-C ?11/20/21 1452 ? ?  ?Delman Kitten, MD ?11/20/21 1601 ? ?

## 2021-11-20 NOTE — Discharge Instructions (Signed)
Call to see if her primary care provider can take the staples out in 7 days.  Clean area daily with mild soap and water and allowed to dry completely.  She may have Tylenol sparingly if needed for pain.  Watch the area for any signs of infection.  Return to the emergency department if any severe worsening of her symptoms or urgent concerns. ?

## 2021-11-20 NOTE — ED Triage Notes (Addendum)
Pt comes via EMS from home with c/o fall. Pt states she just lost her balance and fell. Pt did hit her head on corner of wall. Pt is on thinners, no loc. Pt has laceration noted to top of head bleeding controlled at this time. Pt denies any pain. Pt is A*OX4. ? ?Pt does wear 2L [place] chronically. VSS per EMS ?

## 2021-11-20 NOTE — Telephone Encounter (Signed)
Copied from [place] [id number]. Topic: General - Other ?>> Nov 20, 2021 10:20 AM Leward Quan A wrote: ?Reason for CRM: Patient granddaughter [name] called in asking to speak to Dr Raliegh Ip about getting additional home care assistance for the patient. Say that patient is currently at the ER from a fall where she injured her head and need to get additional help since patient is getting a little harder to handle. Still trying to do things she can not really do due to her age and declining health being more combative. Please call [name] at Ph#  [phone]. ?

## 2021-11-20 NOTE — TOC Initial Note (Signed)
Transition of Care (TOC) - Initial/Assessment Note  ? ? ?Patient Details  ?Name: Kristin [name] ?MRN: [id number] ?Date of Birth: [birth year] ? ?Transition of Care (TOC) CM/SW Contact:    ?Shelbie Hutching, RN ?Phone Number: ?11/20/2021, 11:27 AM ? ?Clinical Narrative:                 ?Patient brought into the emergency room after falling at home and hitting her head.  Patient got 2 staples in her head but will be able to go home.  Patient was set up with home health services at last admission with [facility].  Family, son and granddaughter are at the bedside today and asking what they can do to get more services, they are especially worried about her at night because she does not call for help before she gets up.  Patient does have a rollator, she was not using it when she fell today.  Stressed the importance of calling for help and using her rollator.  She lives with her son and her granddaughter lives right next door. ? ?RNCM will get MD to order RN aide and SW added to Home health services.  Cory with [facility] notified that patient seen in the ED today but will be going home, asked him to get their PCS services department to reach out and offer private pay care services for patient. ?Family and patient are not interested in skilled nursing or ALF, patient wants to remain in her house.  Discussed getting patient a hospital bed but she declines at this time. ? ?Patient will be discharge home with home home health services.   ? ?Expected Discharge Plan: [place] ?Barriers to Discharge: Barriers Resolved ? ? ?Patient Goals and CMS Choice ?Patient states their goals for this hospitalization and ongoing recovery are:: Patient wants to go home ?CMS Medicare.gov Compare Post Acute Care list provided to:: Patient ?Choice offered to / list presented to : Patient, Adult Children ? ?Expected Discharge Plan and Services ?Expected Discharge Plan: [place] ?  ?Discharge Planning Services: CM  Consult ?Post Acute Care Choice: Home Health ?Living arrangements for the past 2 months: [place] ?                ?DME Arranged: N/A ?DME Agency: NA ?  ?  ?  ?HH Arranged: RN, PT, OT, Nurse's Aide, Social Work ?[place] Agency: [place] ?Date HH Agency Contacted: 11/20/21 ?Time [place]: 1120 ?Representative spoke with at [place]: Tommi Rumps ? ?Prior Living Arrangements/Services ?Living arrangements for the past 2 months: [place] ?Lives with:: Adult Children ?Patient language and need for interpreter reviewed:: Yes ?Do you feel safe going back to the place where you live?: Yes      ?Need for Family Participation in Patient Care: Yes (Comment) ?Care giver support system in place?: Yes (comment) (son and granddaughter) ?Current home services: DME (rollator, bedside commode, oxygen, nebulizer) ?Criminal Activity/Legal Involvement Pertinent to Current Situation/Hospitalization: No - Comment as needed ? ?Activities of Daily Living ?  ?  ? ?Permission Sought/Granted ?Permission sought to share information with : Case Manager, Family Supports ?Permission granted to share information with : Yes, Verbal Permission Granted ? Share Information with NAME: Sterling Big ? Permission granted to share info w AGENCY: [facility] ? Permission granted to share info w Relationship: son ? Permission granted to share info w Contact Information: [phone] ? ?Emotional Assessment ?Appearance:: Appears stated age ?Attitude/Demeanor/Rapport: Engaged ?Affect (typically  observed): Accepting, Pleasant ?Orientation: : Oriented to Self, Oriented to Place, Oriented to  Time, Oriented to Situation ?Alcohol / Substance Use: Not Applicable ?Psych Involvement: No (comment) ? ?Admission diagnosis:  fall, head injury, ems ?Patient Active Problem List  ? Diagnosis Date Noted  ? Small cell lung cancer ([place]) 11/17/2021  ? Goals of care, counseling/discussion 11/17/2021  ? Overweight (BMI 25.0-29.9) 11/11/2021  ?  Bacteremia 11/06/2021  ? CAP (community acquired pneumonia) 11/05/2021  ? Postobstructive pneumonia 11/05/2021  ? Mass of upper lobe of right lung 11/05/2021  ? Sepsis due to Escherichia coli (E. coli) ([place]) 11/05/2021  ? Dyslipidemia 11/05/2021  ? COPD with acute exacerbation ([place]) 09/20/2021  ? Acute on chronic diastolic CHF (congestive heart failure) ([place]) 09/19/2021  ? Lymphedema 09/17/2021  ? Ischemic leg pain (Bilateral) 08/19/2021  ? Paroxysmal atrial fibrillation (HCC)   ? History of atrial fibrillation 04/08/2021  ? PVD (peripheral vascular disease) ([place]) 04/08/2021  ? Low blood pressure reading 04/07/2021  ? Physical tolerance to opiate drug 01/28/2021  ? Chronic use of opiate for therapeutic purpose 11/24/2020  ? Atherosclerosis of native arteries of the extremities with ulceration ([place]) 11/03/2020  ? Uncomplicated opioid dependence ([place]) 10/20/2020  ? Encounter for chronic pain management 10/02/2020  ? Encounter for pain management counseling 10/02/2020  ? Chronic kidney disease 10/02/2020  ? Chronic sacroiliac pain (Bilateral) 09/30/2020  ? Osteoarthritis of sacroiliac joints (Bilateral) ([place]) 09/30/2020  ? Chronic midline low back pain without sciatica 09/30/2020  ? Acute respiratory failure with hypoxia ([place]) 08/02/2020  ? Prolonged QT interval 08/02/2020  ? Venous insufficiency of both lower extremities 07/04/2020  ? Varicose veins of left lower extremity with inflammation, with ulcer of ankle limited to breakdown of skin ([place]) 04/01/2020  ? Opioid dependence with opioid-induced disorder ([place]) 02/14/2020  ? Abnormal MRI, lumbar spine (11/03/2019) 01/23/2020  ? Pharmacologic therapy 12/20/2019  ? Other specified dorsopathies, sacral and sacrococcygeal region 06/12/2019  ? Chronic hip pain (Left) 06/12/2019  ? Osteoarthritis of hip (Left) 06/12/2019  ? Greater trochanteric bursitis of hip (Left) 03/12/2019  ? Preop testing 01/22/2019  ? Cervicalgia 10/03/2018  ? Chronic shoulders pain (Bilateral) (L>R)  05/29/2018  ? Osteoarthritis of shoulder (Bilateral) 05/29/2018  ? Osteoarthritis of hip (Bilateral) 05/29/2018  ? Chronic shoulder pain after shoulder replacement (Bilateral) (L>R) 05/29/2018  ? Trigger finger, left middle finger 05/29/2018  ? Spondylosis without myelopathy or radiculopathy, lumbosacral region 02/09/2018  ? Insomnia 02/01/2018  ? Numbness and tingling of upper extremity (Bilateral) 09/07/2017  ? Chronic shoulder pain, Radicular (Bilateral) (L>R) 09/07/2017  ? Cervical facet syndrome (Bilateral) (L>R) 09/07/2017  ? Cervical (3-4 mm) Anterolisthesis of C4 on C5 09/07/2017  ? Cervical foraminal stenosis (Bilateral) 09/07/2017  ? Acute kidney injury superimposed on CKD ([place]) 08/14/2017  ? Chronic lower extremity pain (2ry area of Pain) (Bilateral) (L>R) 05/13/2017  ? DDD (degenerative disc disease), cervical 05/13/2017  ? Chronic cervical radicular pain (3ry area of Pain) (Bilateral) (L>R) 05/04/2017  ? Chronic anticoagulation (Eliquis) 05/04/2017  ? Chronic hip pain (Bilateral) 04/20/2017  ? Chronic neck pain 03/01/2017  ? Hyperlipidemia 02/02/2017  ? Red blood cell antibody positive 11/18/2016  ? Anticoagulated 11/17/2016  ? Atrial fibrillation and flutter ([place]) 11/17/2016  ? Osteoarthritis involving multiple joints 06/14/2016  ? Cardiac pacemaker in situ 01/07/2016  ? S/P cardiac pacemaker procedure 01/07/2016  ? GERD (gastroesophageal reflux disease) 12/18/2015  ? Chronic pain syndrome 06/25/2015  ? Chronic low back pain (1ry area of Pain) (Bilateral) (L>R) w/ sciatica (Bilateral)  06/25/2015  ? Lumbar facet syndrome (Bilateral) (L>R) 06/25/2015  ? Lumbar spondylosis 06/25/2015  ? Chronic lumbar radicular pain (Bilateral) (L>R) 06/25/2015  ? Restless leg syndrome 06/25/2015  ? Myofascial pain 06/25/2015  ? Neurogenic pain 06/25/2015  ? Long term current use of opiate analgesic 06/25/2015  ? Long term prescription opiate use 06/25/2015  ? Opiate use (90 MME/Day) 06/25/2015  ? Opioid-induced  constipation (OIC) 06/25/2015  ? Vitamin D insufficiency 06/25/2015  ? Grade 1 Retrolisthesis of L1 over L2 06/25/2015  ? Lumbar facet hypertrophy (L1-2, L2-3, and L4-5) 06/25/2015  ? Lumbar lateral recess stenosis (L1-2 and

## 2021-11-20 NOTE — Telephone Encounter (Signed)
Called granddaughter [name] ? ?Reviewed ED visit ? ?Declining health for [place]. Now with some cognitive memory symptoms, worse at night. Difficulty mobility safety issues ? ?They need more in home care access, she said insurance may cover up to 8 hours few days a week, she will look into which company in network can do this and try to get [name] order. ? ?I have placed referrals to [place] in home assess and Chronic Care Management team for more assistance. ? ?[name], DO ?[facility] ?[facility] ?11/20/2021, 12:43 PM ? ?

## 2021-11-23 ENCOUNTER — Other Ambulatory Visit: Payer: Self-pay

## 2021-11-23 ENCOUNTER — Telehealth: Payer: Self-pay

## 2021-11-23 ENCOUNTER — Telehealth (INDEPENDENT_AMBULATORY_CARE_PROVIDER_SITE_OTHER): Payer: Medicare HMO | Admitting: Internal Medicine

## 2021-11-23 DIAGNOSIS — R7881 Bacteremia: Secondary | ICD-10-CM

## 2021-11-23 NOTE — Telephone Encounter (Signed)
Called patient to complete rooming questions prior to video visit with Dr. Baxter Flattery this morning. Initially called preferred number [phone]. Patient unavailable at that number, but spoke with patient's listed contact, granddaughter [name]. Erika recommended calling alternate home phone number, stated she assists patient with taking medications, and confirmed listed medications. ? ?This RN called the patient's home phone number [phone]. No answer and unable to leave a voicemail. ? ?The patient was then present in the MyChart video waiting room and the provider proceeded with the visit. ? ?Binnie Kand, RN  ?

## 2021-11-23 NOTE — Progress Notes (Signed)
Virtual Visit via Video Note ? ?I connected with Kristin [name] on 11/23/21 at 11:00 AM EDT by a video enabled telemedicine application and verified that I am speaking with the correct person using two identifiers. ? ?Location: ?Patient: at home with her son also helping in the appt ?Provider: in clinic ?  ?I discussed the limitations of evaluation and management by telemedicine and the availability of in person appointments. The patient expressed understanding and agreed to proceed. ? ?History of Present Illness: ?88 yo F recently hospitalized for ESBL ecoli bacteremia of unclear source. Was treated with 14 days of ertapenem. Her son reports that she did have  ? some hallucinations/delusional talking per family when she came home from hospital with abtx. Now improved. Doing well in the last 2 days, getting back to her baseline health ?Observations/Objective: ?On 2LNc, sitting in NAD ? ?Assessment and Plan: ?No further abtx needed. No issues with diarrhea ? ?Follow Up Instructions: ?No need for follow up unless she needs to seek care for recurrent infection ?  ?I discussed the assessment and treatment plan with the patient. The patient was provided an opportunity to ask questions and all were answered. The patient agreed with the plan and demonstrated an understanding of the instructions. ?  ?The patient was advised to call back or seek an in-person evaluation if the symptoms worsen or if the condition fails to improve as anticipated. ? ?I provided 10 minutes of non-face-to-face time during this encounter. ? ? ?Carlyle Basques, MD  ?

## 2021-11-24 ENCOUNTER — Ambulatory Visit
Admission: RE | Admit: 2021-11-24 | Discharge: 2021-11-24 | Disposition: A | Discharge status: Home / Self Care | Payer: Medicare HMO | Source: Ambulatory Visit | Attending: Oncology | Admitting: Oncology

## 2021-11-24 DIAGNOSIS — I672 Cerebral atherosclerosis: Secondary | ICD-10-CM | POA: Diagnosis not present

## 2021-11-24 DIAGNOSIS — R918 Other nonspecific abnormal finding of lung field: Secondary | ICD-10-CM | POA: Diagnosis not present

## 2021-11-24 DIAGNOSIS — R591 Generalized enlarged lymph nodes: Secondary | ICD-10-CM | POA: Insufficient documentation

## 2021-11-24 DIAGNOSIS — S0003XA Contusion of scalp, initial encounter: Secondary | ICD-10-CM | POA: Diagnosis not present

## 2021-11-24 DIAGNOSIS — I6523 Occlusion and stenosis of bilateral carotid arteries: Secondary | ICD-10-CM | POA: Diagnosis not present

## 2021-11-24 MED ORDER — IOHEXOL 300 MG/ML  SOLN
75.0000 mL | Freq: Once | INTRAMUSCULAR | Status: AC | PRN
  Administered 2021-11-24: 75 mL via INTRAVENOUS

## 2021-11-25 ENCOUNTER — Other Ambulatory Visit: Payer: Self-pay

## 2021-11-25 ENCOUNTER — Emergency Department: Payer: Medicare HMO

## 2021-11-25 ENCOUNTER — Ambulatory Visit: Payer: Medicare HMO | Admitting: Family

## 2021-11-25 ENCOUNTER — Emergency Department
Admission: EM | Admit: 2021-11-25 | Discharge: 2021-11-25 | Disposition: A | Discharge status: Home / Self Care | Payer: Medicare HMO | Attending: Emergency Medicine | Admitting: Emergency Medicine

## 2021-11-25 DIAGNOSIS — Z85118 Personal history of other malignant neoplasm of bronchus and lung: Secondary | ICD-10-CM | POA: Insufficient documentation

## 2021-11-25 DIAGNOSIS — Y92009 Unspecified place in unspecified non-institutional (private) residence as the place of occurrence of the external cause: Secondary | ICD-10-CM | POA: Insufficient documentation

## 2021-11-25 DIAGNOSIS — N289 Disorder of kidney and ureter, unspecified: Secondary | ICD-10-CM | POA: Diagnosis not present

## 2021-11-25 DIAGNOSIS — R519 Headache, unspecified: Secondary | ICD-10-CM | POA: Diagnosis not present

## 2021-11-25 DIAGNOSIS — J439 Emphysema, unspecified: Secondary | ICD-10-CM | POA: Diagnosis not present

## 2021-11-25 DIAGNOSIS — I3139 Other pericardial effusion (noninflammatory): Secondary | ICD-10-CM | POA: Diagnosis not present

## 2021-11-25 DIAGNOSIS — Z20822 Contact with and (suspected) exposure to covid-19: Secondary | ICD-10-CM | POA: Diagnosis not present

## 2021-11-25 DIAGNOSIS — S0101XA Laceration without foreign body of scalp, initial encounter: Secondary | ICD-10-CM | POA: Diagnosis not present

## 2021-11-25 DIAGNOSIS — Z7901 Long term (current) use of anticoagulants: Secondary | ICD-10-CM | POA: Insufficient documentation

## 2021-11-25 DIAGNOSIS — R509 Fever, unspecified: Secondary | ICD-10-CM | POA: Diagnosis not present

## 2021-11-25 DIAGNOSIS — S0990XA Unspecified injury of head, initial encounter: Secondary | ICD-10-CM | POA: Diagnosis not present

## 2021-11-25 DIAGNOSIS — R4182 Altered mental status, unspecified: Secondary | ICD-10-CM | POA: Diagnosis not present

## 2021-11-25 DIAGNOSIS — R5383 Other fatigue: Secondary | ICD-10-CM

## 2021-11-25 DIAGNOSIS — I959 Hypotension, unspecified: Secondary | ICD-10-CM | POA: Diagnosis not present

## 2021-11-25 DIAGNOSIS — R918 Other nonspecific abnormal finding of lung field: Secondary | ICD-10-CM | POA: Diagnosis not present

## 2021-11-25 DIAGNOSIS — R531 Weakness: Secondary | ICD-10-CM | POA: Diagnosis not present

## 2021-11-25 DIAGNOSIS — R0902 Hypoxemia: Secondary | ICD-10-CM | POA: Diagnosis not present

## 2021-11-25 DIAGNOSIS — W19XXXA Unspecified fall, initial encounter: Secondary | ICD-10-CM | POA: Diagnosis not present

## 2021-11-25 DIAGNOSIS — R0602 Shortness of breath: Secondary | ICD-10-CM | POA: Diagnosis not present

## 2021-11-25 DIAGNOSIS — M4312 Spondylolisthesis, cervical region: Secondary | ICD-10-CM | POA: Diagnosis not present

## 2021-11-25 LAB — RESP PANEL BY RT-PCR (FLU A&B, COVID) ARPGX2
Influenza A by PCR: NEGATIVE
Influenza B by PCR: NEGATIVE
SARS Coronavirus 2 by RT PCR: NEGATIVE

## 2021-11-25 LAB — CBC
HCT: 45.6 % (ref 36.0–46.0)
Hemoglobin: 14.1 g/dL (ref 12.0–15.0)
MCH: 27.2 pg (ref 26.0–34.0)
MCHC: 30.9 g/dL (ref 30.0–36.0)
MCV: 88 fL (ref 80.0–100.0)
Platelets: 157 10*3/uL (ref 150–400)
RBC: 5.18 MIL/uL — ABNORMAL HIGH (ref 3.87–5.11)
RDW: 15.4 % (ref 11.5–15.5)
WBC: 8.7 10*3/uL (ref 4.0–10.5)
nRBC: 0 % (ref 0.0–0.2)

## 2021-11-25 LAB — URINALYSIS, COMPLETE (UACMP) WITH MICROSCOPIC
Bacteria, UA: NONE SEEN
Bilirubin Urine: NEGATIVE
Glucose, UA: NEGATIVE mg/dL
Hgb urine dipstick: NEGATIVE
Ketones, ur: NEGATIVE mg/dL
Leukocytes,Ua: NEGATIVE
Nitrite: NEGATIVE
Protein, ur: NEGATIVE mg/dL
Specific Gravity, Urine: 1.025 (ref 1.005–1.030)
pH: 8 (ref 5.0–8.0)

## 2021-11-25 LAB — COMPREHENSIVE METABOLIC PANEL
ALT: 18 U/L (ref 0–44)
AST: 33 U/L (ref 15–41)
Albumin: 3.8 g/dL (ref 3.5–5.0)
Alkaline Phosphatase: 85 U/L (ref 38–126)
Anion gap: 10 (ref 5–15)
BUN: 33 mg/dL — ABNORMAL HIGH (ref 8–23)
CO2: 25 mmol/L (ref 22–32)
Calcium: 9.4 mg/dL (ref 8.9–10.3)
Chloride: 103 mmol/L (ref 98–111)
Creatinine, Ser: 1.63 mg/dL — ABNORMAL HIGH (ref 0.44–1.00)
GFR, Estimated: 30 mL/min — ABNORMAL LOW (ref >60)
Glucose, Bld: 98 mg/dL (ref 70–99)
Potassium: 4.7 mmol/L (ref 3.5–5.1)
Sodium: 138 mmol/L (ref 135–145)
Total Bilirubin: 1.4 mg/dL — ABNORMAL HIGH (ref 0.3–1.2)
Total Protein: 8 g/dL (ref 6.5–8.1)

## 2021-11-25 MED ORDER — SODIUM CHLORIDE 0.9 % IV BOLUS
1000.0000 mL | Freq: Once | INTRAVENOUS | Status: AC
  Administered 2021-11-25: 1000 mL via INTRAVENOUS

## 2021-11-25 NOTE — Discharge Instructions (Signed)
As we discussed please increase fluids over the next 24 hours and follow-up with your doctor in 1 to 2 days for recheck/reevaluation.  Return to the emergency department for any symptom personally concerning to yourself. ?

## 2021-11-25 NOTE — ED Notes (Signed)
Unable to obtain labs , iv tem consulted for additional access and labs  ?

## 2021-11-25 NOTE — ED Provider Notes (Signed)
? ?[facility] ?Provider Note ? ? ? Event Date/Time  ? First MD Initiated Contact with Patient 11/25/21 [phone]   ?  (approximate) ? ?History  ? ?Chief Complaint: Fall ? ?HPI ? ?Kristin [name] is a 88 y.o. female who presents to the emergency department for a fall.  Patient is coming from home had a temperature of 100.1 per EMS.  Patient states she got up around 6 AM but does not recall falling.  Family found her on the floor.  Family states patient has been on antibiotics for the last 2 weeks for a urinary tract infection had a fall last week repaired by staples to the head.  Patient is currently on Eliquis.  Wears 3 L O2 chronically at home.  Here the patient is awake alert.  She has no specific complaints besides some pain to her head. ? ?Physical Exam  ? ?Triage Vital Signs: ?ED Triage Vitals  ?Enc Vitals Group  ?   BP 11/25/21 0915 (!) 121/56  ?   Pulse Rate 11/25/21 0915 65  ?   Resp 11/25/21 0915 16  ?   Temp 11/25/21 0915 99.2 ?F (37.3 ?C)  ?   Temp Source 11/25/21 0915 Oral  ?   SpO2 11/25/21 0915 95 %  ?   Weight 11/25/21 0917 163 lb 5.8 oz (74.1 kg)  ?   Height 11/25/21 0917 5\' 7"  (1.702 m)  ?   Head Circumference --   ?   Peak Flow --   ?   Pain Score 11/25/21 0917 0  ?   Pain Loc --   ?   Pain Edu? --   ?   Excl. in [place]? --   ? ? ?Most recent vital signs: ?Vitals:  ? 11/25/21 0915  ?BP: (!) 121/56  ?Pulse: 65  ?Resp: 16  ?Temp: 99.2 ?F (37.3 ?C)  ?SpO2: 95%  ? ? ?General: Awake, no distress.  ?CV:  Good peripheral perfusion.  Regular rate and rhythm  ?Resp:  Normal effort.  Equal breath sounds bilaterally.  ?Abd:  No distention.  Soft, nontender.  No rebound or guarding. ?Other:  Good range of motion of extremities. ? ? ?ED Results / Procedures / Treatments  ? ?RADIOLOGY ? ?I personally reviewed the CT images, no significant abnormality of the CT scan of the head seen on my evaluation. ?Radiology has read the CT scans as negative for acute abnormality.  Several chronic  findings. ? ? ?MEDICATIONS ORDERED IN ED: ?Medications - No data to display ? ? ?IMPRESSION / MDM / ASSESSMENT AND PLAN / ED COURSE  ?I reviewed the triage vital signs and the nursing notes. ? ?Patient presents to the emergency department after a fall found down at home.  Patient does not know how she fell.  EMS reports a low-grade temperature 100.1 currently 99.2 in the emergency department.  Family reports patient has been on antibiotics for the past 2 weeks for urinary tract infection.  Overall patient appears well she does have good range of motion in the extremities.  Patient is complaining of some pain to the head, recently had staples from a prior fall/head injury.  We will obtain CT imaging the head and C-spine.  We will check labs and obtain a urine sample.  Patient agreeable to plan. ? ?CT scans show no significant normality.  Patient's lab work shows a normal CBC with normal white blood cell count.  Chemistry shows mild renal insufficiency creatinine 1.6 with a baseline around  1.0.  We will IV hydrate with a liter of fluid.  Urinalysis pending. ? ? ?Patient's work-up is overall reassuring.  Urinalysis has resulted showing no significant abnormality.  CBC is normal.  Chemistry does show mild renal insufficiency.  Patient received a liter of fluids.  Son states recently diagnosed with lung cancer has not yet started chemotherapy.  CT scan of the chest performed to rule out occult pneumonia, no infectious symptoms seen on CT imaging.  Given the patient's reassuring work-up we will discharge home with the son.  I discussed the plan of care with the son who is agreeable also discussed increasing oral fluids over the next 24 hours.  Discussed return precautions and PCP follow-up. ? ?FINAL CLINICAL IMPRESSION(S) / ED DIAGNOSES  ? ?Fall ?Weakness ? ?Note:  This document was prepared using Dragon voice recognition software and may include unintentional dictation errors. ?  ?Harvest Dark, MD ?11/25/21  1551 ? ?

## 2021-11-25 NOTE — ED Triage Notes (Signed)
BIB by [facility] from home. Family found in floor this morning. Temp of 100.1. Pt remembers getting up at 0600 but doesnt remember anything after. Pt has been on antibiotics last 2 weeks. Fall last friday and staples in head. Eliquis. Lumg cancer found recently. Chronic 3 liters via [place] at home.  ?71 T for EMS ?141/67 ?85 HR ?16 RR ? ?

## 2021-11-25 NOTE — Progress Notes (Signed)
[place] [facility]) Hospital Liaison note: ? ?This is a pending outpatient-based Palliative Care patient. Will continue to follow for disposition. ? ?Please call with any outpatient palliative questions or concerns. ? ?Thank you, ?Lorelee Market, LPN ?[facility] Hospital Liaison ?[phone] ?

## 2021-11-26 ENCOUNTER — Encounter (HOSPITAL_COMMUNITY)
Admission: RE | Admit: 2021-11-26 | Discharge: 2021-11-26 | Disposition: A | Discharge status: Home / Self Care | Payer: Medicare HMO | Source: Ambulatory Visit | Attending: Oncology | Admitting: Oncology

## 2021-11-26 ENCOUNTER — Ambulatory Visit: Payer: Medicare HMO | Admitting: Family

## 2021-11-26 DIAGNOSIS — R591 Generalized enlarged lymph nodes: Secondary | ICD-10-CM | POA: Insufficient documentation

## 2021-11-26 DIAGNOSIS — R911 Solitary pulmonary nodule: Secondary | ICD-10-CM | POA: Diagnosis not present

## 2021-11-26 DIAGNOSIS — R918 Other nonspecific abnormal finding of lung field: Secondary | ICD-10-CM | POA: Diagnosis not present

## 2021-11-26 MED ORDER — FLUDEOXYGLUCOSE F - 18 (FDG) INJECTION
7.9400 | Freq: Once | INTRAVENOUS | Status: AC | PRN
  Administered 2021-11-26: 7.94 via INTRAVENOUS

## 2021-11-30 ENCOUNTER — Ambulatory Visit (INDEPENDENT_AMBULATORY_CARE_PROVIDER_SITE_OTHER): Payer: Medicare HMO

## 2021-11-30 DIAGNOSIS — I5032 Chronic diastolic (congestive) heart failure: Secondary | ICD-10-CM

## 2021-11-30 DIAGNOSIS — E785 Hyperlipidemia, unspecified: Secondary | ICD-10-CM

## 2021-11-30 DIAGNOSIS — R296 Repeated falls: Secondary | ICD-10-CM

## 2021-11-30 DIAGNOSIS — J441 Chronic obstructive pulmonary disease with (acute) exacerbation: Secondary | ICD-10-CM

## 2021-11-30 DIAGNOSIS — R918 Other nonspecific abnormal finding of lung field: Secondary | ICD-10-CM

## 2021-11-30 DIAGNOSIS — J432 Centrilobular emphysema: Secondary | ICD-10-CM

## 2021-11-30 NOTE — Patient Instructions (Signed)
Visit Information  ? ?Thank you for taking time to visit with me today. Please don't hesitate to contact me if I can be of assistance to you before our next scheduled telephone appointment. ? ?Following are the goals we discussed today:  ?(Copy and paste patient goals from clinical care plan here) ? ?Our next appointment is by telephone on 12-21-2021  at 0900 am ? ?Please call the care guide team at [phone] if you need to cancel or reschedule your appointment.  ? ?If you are experiencing a Mental Health or [place] or need someone to talk to, please call the Suicide and Crisis Lifeline: 988 ?call the [place] National Suicide Prevention Lifeline: (986) 147-8797 or TTY: 332-572-6195 TTY (409)263-7218) to talk to a trained counselor ?call 1-800-273-TALK (toll free, 24 hour hotline)  ? ?Following is a copy of your full care plan:  ?Care Plan : Heart Failure (Adult)  ?Updates made by Vanita Ingles, RN since 11/30/2021 12:00 AM  ?Completed 11/30/2021  ? ?Problem: Symptom Exacerbation (Heart Failure) Resolved 11/30/2021  ?  ? ?Problem: Disease Progression (Heart Failure) Resolved 11/30/2021  ?  ? ?Problem: Activity Tolerance (Heart Failure) Resolved 11/30/2021  ?  ? ?Problem: Obstructive Sleep Apnea (Heart Failure) Resolved 11/30/2021  ?  ? ?Care Plan : Chronic Pain (Adult)  ?Updates made by Vanita Ingles, RN since 11/30/2021 12:00 AM  ?Completed 11/30/2021  ? ?Problem: Pain Management Plan (Chronic Pain) Resolved 11/30/2021  ?  ? ?Problem: Chronic Pain Management (Chronic Pain) Resolved 11/30/2021  ?  ? ?Problem: Harm or Injury (Chronic Pain) Resolved 11/30/2021  ?  ? ?Care Plan : PharmD - Medication Assistance  ?Updates made by Vanita Ingles, RN since 11/30/2021 12:00 AM  ?Completed 11/30/2021  ? ?Problem: Disease Progression Resolved 11/30/2021  ?  ? ?Care Plan : RNCM: General Plan of Care (Adult) for Chronic Disease Management and Care Coordination Needs  ?Updates made by Vanita Ingles, RN since 11/30/2021 12:00  AM  ?  ? ?Problem: RNCM: Development of Plan of Care for Chronic Disease Management (CHF, COPD, Lung Cancer, HLD, Falls and Safety)   ?Priority: High  ?  ? ?Long-Range Goal: RNCM: Development of Plan of Care for Chronic Disease Management (CHF, COPD, Lung Cancer, HLD, Falls and Safety)   ?Start Date: 11/30/2021  ?Expected End Date: 12/01/2022  ?Priority: High  ?Note:   ?Current Barriers:  ?Knowledge Deficits related to plan of care for management of CHF, HLD, COPD, and Lung cancer and falls and safety concerns  ?Care Coordination needs related to Level of care concerns, Limited access to caregiver, Memory Deficits, Inability to perform IADL's independently, and Lacks knowledge of community resource: additional support in the home for changes in mobility and decline in chronic conditions  ?Chronic Disease Management support and education needs related to CHF, HLD, COPD, and lung cancer, falls and safety ?Falls ? ?RNCM Clinical Goal(s):  ?Patient will verbalize understanding of plan for management of CHF, HLD, COPD, and Lung cancer, falls and safety concerns as evidenced by keeping appointments, monitoring for changes in chronic conditions and calling the office for questions and concerns, and working with the CCM team to optimize health and well being ?take all medications exactly as prescribed and will call provider for medication related questions as evidenced by compliance with medications and calling for refills before running out  of medications    ?attend all scheduled medical appointments: as scheduled with pcp and specialist as evidenced by keeping appointments and calling the office  for schedule change needs        ?demonstrate improved and ongoing adherence to prescribed treatment plan for CHF, HLD, COPD, and Lung cancer and falls and safety as evidenced by no acute exacerbations in conditions, stable labs and stable VS, follow up with pcp and specialist on a routine basis ?demonstrate ongoing self health  care management ability for effective management of Chronic conditions as evidenced by working with the CCM team through collaboration with Consulting civil engineer, provider, and care team.  ? ?Interventions: ?1:1 collaboration with primary care provider regarding development and update of comprehensive plan of care as evidenced by provider attestation and co-signature ?Inter-disciplinary care team collaboration (see longitudinal plan of care) ?Evaluation of current treatment plan related to  self management and patient's adherence to plan as established by provider ? ? ?Heart Failure Interventions:  (Status: New goal. Goal on Track (progressing): YES.)  Long Term Goal  ?Wt Readings from Last 3 Encounters:  ?11/25/21 163 lb 5.8 oz (74.1 kg)  ?11/20/21 163 lb 5.8 oz (74.1 kg)  ?11/17/21 164 lb 8 oz (74.6 kg)  ? ?Basic overview and discussion of pathophysiology of Heart Failure reviewed ?Provided education on low sodium diet ?Reviewed Heart Failure Action Plan in depth and provided written copy ?Assessed need for readable accurate scales in home ?Provided education about placing scale on hard, flat surface ?Advised patient to weigh each morning after emptying bladder ?Discussed importance of daily weight and advised patient to weigh and record daily ?Reviewed role of diuretics in prevention of fluid overload and management of heart failure ?Discussed the importance of keeping all appointments with provider ?Provided patient with education about the role of exercise in the management of heart failure ?Advised patient to discuss changes in HF or heart  health with provider ?Screening for signs and symptoms of depression related to chronic disease state  ?Assessed social determinant of health barriers ? ?COPD: (Status: New goal. Goal on Track (progressing): YES.) Long Term Goal  ?Reviewed medications with patient, including use of prescribed maintenance and rescue inhalers, and provided instruction on medication management and  the importance of adherence ?Provided patient with basic written and verbal COPD education on self care/management/and exacerbation prevention ?Advised patient to track and manage COPD triggers ?Provided written and verbal instructions on pursed lip breathing and utilized returned demonstration as teach back ?Provided instruction about proper use of medications used for management of COPD including inhalers ?Advised patient to self assesses COPD action plan zone and make appointment with provider if in the yellow zone for 48 hours without improvement ?Advised patient to engage in light exercise as tolerated 3-5 days a week to aid in the the management of COPD ?Provided education about and advised patient to utilize infection prevention strategies to reduce risk of respiratory infection ?Discussed the importance of adequate rest and management of fatigue with COPD ? ?Falls:  (Status: New goal. Goal on Track (progressing): YES.) Long Term Goal  ?Provided written and verbal education re: potential causes of falls and Fall prevention strategies ?Reviewed medications and discussed potential side effects of medications such as dizziness and frequent urination ?Advised patient of importance of notifying provider of falls. 11-30-2021: Reviewed with the patients granddaughter the importance of notifying the provider of new falls. Per granddaughter the 2 falls he has had in the last month is the first falls she has ever had.  ?Assessed for signs and symptoms of orthostatic hypotension ?Assessed for falls since last encounter. 11-30-2021: The last falls were 11-20-2021 and  11-25-2021 ?Assessed patients knowledge of fall risk prevention secondary to previously provided education ?Provided patient information for fall alert systems ?Assessed working status of life alert bracelet and patient adherence ?Advised patient to discuss safety and fall prevention, and the need for additional DME with provider. 11-30-2021: The patient uses a  walker when ambulating. Before changes in functional status she used a cane but now she uses a walker. The granddaughter has purchased a bed alarm to use in the home so her uncle will be alerted when the pat

## 2021-11-30 NOTE — Chronic Care Management (AMB) (Signed)
?Chronic Care Management  ? ?CCM RN Visit Note ? ?11/30/2021 ?Name: Kristin [name] MRN: [id number] DOB: Sep 10, 1929 ? ?Subjective: ?Kristin [name] is a 88 y.o. year old female who is a primary care patient of Olin Hauser, DO. The care management team was consulted for assistance with disease management and care coordination needs.   ? ?Engaged with patient by telephone for initial visit in response to provider referral for case management and/or care coordination services. Spoke to the patients granddaughter Marcellina Millin ? ?Consent to Services:  ?The patient was given the following information about Chronic Care Management services today, agreed to services, and gave verbal consent: 1. CCM service includes personalized support from designated clinical staff supervised by the primary care provider, including individualized plan of care and coordination with other care providers 2. 24/7 contact phone numbers for assistance for urgent and routine care needs. 3. Service will only be billed when office clinical staff spend 20 minutes or more in a month to coordinate care. 4. Only one practitioner may furnish and bill the service in a calendar month. 5.The patient may stop CCM services at any time (effective at the end of the month) by phone call to the office staff. 6. The patient will be responsible for cost sharing (co-pay) of up to 20% of the service fee (after annual deductible is met). Patient agreed to services and consent obtained. ? ?Patient agreed to services and verbal consent obtained.  ? ?Assessment: Review of patient past medical history, allergies, medications, health status, including review of consultants reports, laboratory and other test data, was performed as part of comprehensive evaluation and provision of chronic care management services.  ? ?SDOH (Social Determinants of Health) assessments and interventions performed:  ?SDOH Interventions   ? ?Flowsheet Row Most Recent Value  ?SDOH  Interventions   ?Food Insecurity Interventions Intervention Not Indicated  ?Financial Strain Interventions Intervention Not Indicated  ?Housing Interventions Intervention Not Indicated  ?Intimate Partner Violence Interventions Intervention Not Indicated  ?Physical Activity Interventions Other (Comments)  [limited mobility]  ?Stress Interventions Intervention Not Indicated  ?Social Connections Interventions Intervention Not Indicated  ?Transportation Interventions Intervention Not Indicated  ? ?  ?  ? ?[place] ? ?Allergies  ?Allergen Reactions  ? Flexeril [Cyclobenzaprine Hcl]   ?  Severe Rash  ? Vancomycin Rash  ?  [place] Syndrome  ? ? ?Outpatient Encounter Medications as of 11/30/2021  ?Medication Sig  ? acetaminophen (TYLENOL) 500 MG tablet Take 500 mg by mouth in the morning and at bedtime.  ? albuterol (PROVENTIL) (2.5 MG/3ML) 0.083% nebulizer solution USE 1 VIAL IN NEBULIZER EVERY 8 HOURS AS NEEDED FOR WHEEZING OR SHORTNESS OF BREATH.  ? apixaban (ELIQUIS) 2.5 MG TABS tablet Take 1 tablet (2.5 mg total) by mouth 2 (two) times daily.  ? atorvastatin (LIPITOR) 20 MG tablet TAKE 1 TABLET BY MOUTH ONCE DAILY FOR CHOLESTEROL  ? bisacodyl (DULCOLAX) 5 MG EC tablet Take 2 tablets (10 mg total) by mouth daily as needed for moderate constipation.  ? BREZTRI AEROSPHERE 160-9-4.8 MCG/ACT AERO Inhale 2 puffs into the lungs 2 (two) times daily.  ? chlorpheniramine-HYDROcodone 10-8 MG/5ML Take 5 mLs by mouth every 12 (twelve) hours as needed for cough. (Patient not taking: Reported on 11/23/2021)  ? Cholecalciferol (VITAMIN D) 2000 units tablet Take 2,000 Units by mouth daily.   ? diclofenac Sodium (VOLTAREN) 1 % GEL Apply 4 g topically 2 (two) times daily as needed (As needed for pain).  ? feeding supplement (  ENSURE ENLIVE / ENSURE PLUS) LIQD Take 237 mLs by mouth 2 (two) times daily between meals.  ? furosemide (LASIX) 40 MG tablet Take 1 tablet (40 mg total) by mouth daily.  ? gabapentin (NEURONTIN) 300 MG capsule  Take 1 capsule (300 mg total) by mouth 3 (three) times daily.  ? GNP ASPIRIN LOW DOSE 81 MG EC tablet TAKE 1 TABLET BY MOUTH ONCE DAILY. *SWALLOW WHOLE*  ? levocetirizine (XYZAL) 5 MG tablet TAKE (1/2) TABLET BY MOUTH EVERY OTHER DAY.  ? Melatonin 10 MG TABS Take 10 mg by mouth at bedtime.  ? Multiple Vitamin (MULTIVITAMIN WITH MINERALS) TABS tablet Take 1 tablet by mouth daily.  ? oxyCODONE (OXY IR/ROXICODONE) 5 MG immediate release tablet Take 2 tablets (10 mg total) by mouth 2 (two) times daily. May also take 1 tablet (5 mg total) at bedtime as needed for severe pain. Must last 30 days..  ? OXYGEN Inhale 2 L into the lungs daily.  ? rOPINIRole (REQUIP) 2 MG tablet TAKE 2 TABLETS BY MOUTH AT BEDTIME  ? ?No facility-administered encounter medications on file as of 11/30/2021.  ? ? ?Patient Active Problem List  ? Diagnosis Date Noted  ? Small cell lung cancer ([place]) 11/17/2021  ? Goals of care, counseling/discussion 11/17/2021  ? Overweight (BMI 25.0-29.9) 11/11/2021  ? Bacteremia 11/06/2021  ? CAP (community acquired pneumonia) 11/05/2021  ? Postobstructive pneumonia 11/05/2021  ? Mass of upper lobe of right lung 11/05/2021  ? Sepsis due to Escherichia coli (E. coli) ([place]) 11/05/2021  ? Dyslipidemia 11/05/2021  ? COPD with acute exacerbation ([place]) 09/20/2021  ? Acute on chronic diastolic CHF (congestive heart failure) ([place]) 09/19/2021  ? Lymphedema 09/17/2021  ? Ischemic leg pain (Bilateral) 08/19/2021  ? Paroxysmal atrial fibrillation ([facility])   ? History of atrial fibrillation 04/08/2021  ? PVD (peripheral vascular disease) ([place]) 04/08/2021  ? Low blood pressure reading 04/07/2021  ? Physical tolerance to opiate drug 01/28/2021  ? Chronic use of opiate for therapeutic purpose 11/24/2020  ? Atherosclerosis of native arteries of the extremities with ulceration ([place]) 11/03/2020  ? Uncomplicated opioid dependence ([place]) 10/20/2020  ? Encounter for chronic pain management 10/02/2020  ? Encounter for pain management  counseling 10/02/2020  ? Chronic kidney disease 10/02/2020  ? Chronic sacroiliac pain (Bilateral) 09/30/2020  ? Osteoarthritis of sacroiliac joints (Bilateral) ([place]) 09/30/2020  ? Chronic midline low back pain without sciatica 09/30/2020  ? Acute respiratory failure with hypoxia ([place]) 08/02/2020  ? Prolonged QT interval 08/02/2020  ? Venous insufficiency of both lower extremities 07/04/2020  ? Varicose veins of left lower extremity with inflammation, with ulcer of ankle limited to breakdown of skin ([place]) 04/01/2020  ? Opioid dependence with opioid-induced disorder ([place]) 02/14/2020  ? Abnormal MRI, lumbar spine (11/03/2019) 01/23/2020  ? Pharmacologic therapy 12/20/2019  ? Other specified dorsopathies, sacral and sacrococcygeal region 06/12/2019  ? Chronic hip pain (Left) 06/12/2019  ? Osteoarthritis of hip (Left) 06/12/2019  ? Greater trochanteric bursitis of hip (Left) 03/12/2019  ? Preop testing 01/22/2019  ? Cervicalgia 10/03/2018  ? Chronic shoulders pain (Bilateral) (L>R) 05/29/2018  ? Osteoarthritis of shoulder (Bilateral) 05/29/2018  ? Osteoarthritis of hip (Bilateral) 05/29/2018  ? Chronic shoulder pain after shoulder replacement (Bilateral) (L>R) 05/29/2018  ? Trigger finger, left middle finger 05/29/2018  ? Spondylosis without myelopathy or radiculopathy, lumbosacral region 02/09/2018  ? Insomnia 02/01/2018  ? Numbness and tingling of upper extremity (Bilateral) 09/07/2017  ? Chronic shoulder pain, Radicular (Bilateral) (L>R) 09/07/2017  ? Cervical facet syndrome (  Bilateral) (L>R) 09/07/2017  ? Cervical (3-4 mm) Anterolisthesis of C4 on C5 09/07/2017  ? Cervical foraminal stenosis (Bilateral) 09/07/2017  ? Acute kidney injury superimposed on CKD ([place]) 08/14/2017  ? Chronic lower extremity pain (2ry area of Pain) (Bilateral) (L>R) 05/13/2017  ? DDD (degenerative disc disease), cervical 05/13/2017  ? Chronic cervical radicular pain (3ry area of Pain) (Bilateral) (L>R) 05/04/2017  ? Chronic anticoagulation  (Eliquis) 05/04/2017  ? Chronic hip pain (Bilateral) 04/20/2017  ? Chronic neck pain 03/01/2017  ? Hyperlipidemia 02/02/2017  ? Red blood cell antibody positive 11/18/2016  ? Anticoagulated 11/17/2016  ? Atrial fibrillat

## 2021-12-01 ENCOUNTER — Ambulatory Visit
Admission: RE | Admit: 2021-12-01 | Discharge: 2021-12-01 | Disposition: A | Discharge status: Home / Self Care | Payer: Medicare HMO | Source: Ambulatory Visit | Attending: Radiation Oncology | Admitting: Radiation Oncology

## 2021-12-01 ENCOUNTER — Telehealth: Payer: Self-pay | Admitting: Nurse Practitioner

## 2021-12-01 DIAGNOSIS — C3411 Malignant neoplasm of upper lobe, right bronchus or lung: Secondary | ICD-10-CM | POA: Insufficient documentation

## 2021-12-01 DIAGNOSIS — Z510 Encounter for antineoplastic radiation therapy: Secondary | ICD-10-CM | POA: Insufficient documentation

## 2021-12-01 DIAGNOSIS — C771 Secondary and unspecified malignant neoplasm of intrathoracic lymph nodes: Secondary | ICD-10-CM | POA: Diagnosis not present

## 2021-12-01 DIAGNOSIS — Z87891 Personal history of nicotine dependence: Secondary | ICD-10-CM | POA: Diagnosis not present

## 2021-12-01 NOTE — Telephone Encounter (Signed)
Spoke with patient's granddaughter Marcellina Millin, regarding the Palliative referral/services and all questions were answered and she was in agreement with scheduling visit.  I have scheduled a MyChart Consult for 12/03/21 @ 2 PM.   ?

## 2021-12-02 ENCOUNTER — Telehealth: Payer: Self-pay

## 2021-12-02 ENCOUNTER — Encounter: Payer: Self-pay | Admitting: *Deleted

## 2021-12-02 ENCOUNTER — Inpatient Hospital Stay: Payer: Medicare HMO | Attending: Oncology | Admitting: Internal Medicine

## 2021-12-02 ENCOUNTER — Encounter: Payer: Self-pay | Admitting: Internal Medicine

## 2021-12-02 DIAGNOSIS — C3411 Malignant neoplasm of upper lobe, right bronchus or lung: Secondary | ICD-10-CM | POA: Diagnosis not present

## 2021-12-02 DIAGNOSIS — Z9221 Personal history of antineoplastic chemotherapy: Secondary | ICD-10-CM | POA: Diagnosis not present

## 2021-12-02 DIAGNOSIS — C7A8 Other malignant neuroendocrine tumors: Secondary | ICD-10-CM | POA: Insufficient documentation

## 2021-12-02 DIAGNOSIS — Z853 Personal history of malignant neoplasm of breast: Secondary | ICD-10-CM | POA: Diagnosis not present

## 2021-12-02 NOTE — Progress Notes (Signed)
[facility] ?CONSULT NOTE ? ?Patient Care Team: ?Olin Hauser, DO as PCP - General (Family Medicine) ?Olin Hauser, DO as Consulting Physician (Family Medicine) ?Isaias Cowman, MD as Consulting Physician (Cardiology) ?Milinda Pointer, MD as Referring Physician (Pain Medicine) ?Alisa Graff, FNP as Nurse Practitioner (Family Medicine) ?Delles, Virl Diamond, RPH-CPP as Pharmacist ?Florance, Tomasa Blase, RN as Empire Management ?Telford Nab, RN as Sales executive ?Vanita Ingles, RN as Case Manager ?Rebekah Chesterfield, LCSW as Education officer, museum (Licensed Holiday representative) ? ?CHIEF COMPLAINTS/PURPOSE OF CONSULTATION: lung nodule/mass ? ?# MARCH 2023-CT chest abdomen pelvis without contrast showed a 4.5 x 4.2 cm right upper lobe lung mass concerning for lung cancer.  Right upper lobe 0.5 cm nodule stable.  Left midlung 0.6 cm solid nodule.  2 indistinct peribronchovascular pulmonary nodules in the right lower lobe.  No other evidence of distant metastatic disease.  No evidence of hilar or mediastinal adenopathy noted on CT scan.  She underwent bronchoscopy with biopsy of the right upper lobe lung mass as well as station 10 R lymph node both of which were positive for metastatic small cell neuroendocrine carcinoma.[Dr.Aleskerov] ? ?# Hx of breast cancer [remote; [facility]]-poor tolerance to chemotherapy/sepsis discontinued. ? ?Hx CHF, COPD on home oxygen, paroxysmal A-fib on Eliquis  ? ?Oncology History  ?Malignant neoplasm of right upper lobe of lung ([place])  ?12/02/2021 Initial Diagnosis  ? Malignant neoplasm of right upper lobe of lung ([place]) ?  ?12/02/2021 Cancer Staging  ? Staging form: Lung, AJCC 8th Edition ?- Clinical: Stage IIB (cT2, cN1, cM0) - Signed by Cammie Sickle, MD on 12/02/2021 ?Histopathologic type: Small cell carcinoma, NOS ? ?  ? ? ? ?HISTORY OF PRESENTING ILLNESS: Patient is accompanied by her granddaughter.   Patient in wheelchair.  On 2 L of oxygen. ?[name] 88 y.o.  female history of smoking with multiple medical problems including COPD/CHF/remote history of breast cancer-with a newly diagnosed small cell lung cancer is here for follow-up.  ? ?Patient was seen initially in the hospital when she presented to the hospital for worsening shortness of breath; CT imaging as above.  Patient further underwent bronchoscopy/biopsy. ? ?In the interim patient has been evaluated by radiation oncology.  ? ?In the interim patient was also evaluated in the emergency room-post fall.  Currently back to baseline health. ? ? ?Review of Systems  ?Constitutional:  Positive for malaise/fatigue and weight loss. Negative for chills, diaphoresis and fever.  ?HENT:  Negative for nosebleeds and sore throat.   ?Eyes:  Negative for double vision.  ?Respiratory:  Positive for cough, sputum production and shortness of breath. Negative for wheezing.   ?Cardiovascular:  Negative for chest pain, palpitations, orthopnea and leg swelling.  ?Gastrointestinal:  Negative for abdominal pain, blood in stool, constipation, diarrhea, heartburn, melena, nausea and vomiting.  ?Genitourinary:  Negative for dysuria, frequency and urgency.  ?Musculoskeletal:  Positive for back pain and joint pain.  ?Skin: Negative.  Negative for itching and rash.  ?Neurological:  Negative for dizziness, tingling, focal weakness, weakness and headaches.  ?Endo/Heme/Allergies:  Does not bruise/bleed easily.  ?Psychiatric/Behavioral:  Negative for depression. The patient is not nervous/anxious and does not have insomnia.    ? ?MEDICAL HISTORY:  ?Past Medical History:  ?Diagnosis Date  ? Acute postoperative pain 02/09/2018  ? Arrhythmia, sinus node 04/03/2014  ? Arthritis   ? Arthritis   ? Atrial fibrillation ([place])   ? Back pain   ? lower  back chronic  ? Bradycardia   ? Breast cancer ([place]) 2004  ? right breast cancer  ? Cancer [facility]) 2004  ? rt breast cancer-post lumpectomy- chemo/rad   ? CHF (congestive heart failure) ([place])   ? Chronic back pain   ? CKD (chronic kidney disease)   ? COPD (chronic obstructive pulmonary disease) ([place])   ? wears O2 at 2L via [place] at night  ? COVID-19 06/11/2020  ? Cystocele   ? Decubitus ulcers   ? Dehydration   ? Gout   ? Hematuria   ? Hepatitis C   ? Hyperlipidemia   ? Hypertension   ? Hypomagnesemia 06/25/2015  ? NSTEMI (non-ST elevated myocardial infarction) ([place]) 04/08/2021  ? OAB (overactive bladder)   ? Overactive bladder   ? Overdose opiate, accidental or unintentional, sequela 10/02/2020  ? Restless leg   ? Shoulder pain, bilateral   ? Urinary frequency   ? UTI (lower urinary tract infection)   ? Vaginal atrophy   ? ? ?SURGICAL HISTORY: ?Past Surgical History:  ?Procedure Laterality Date  ? ABDOMINAL HYSTERECTOMY    ? BACK SURGERY    ? BREAST LUMPECTOMY Right 2004  ? BREAST SURGERY    ? rt lumpectomy  ? CARPAL TUNNEL RELEASE    ? CATARACT EXTRACTION W/PHACO  10/19/2011  ? Procedure: CATARACT EXTRACTION PHACO AND INTRAOCULAR LENS PLACEMENT (IOC);  Surgeon: Elta Guadeloupe T. Gershon Crane, MD;  Location: [facility];  Service: Ophthalmology;  Laterality: Right;  CDE:10.81  ? CATARACT EXTRACTION W/PHACO  11/02/2011  ? Procedure: CATARACT EXTRACTION PHACO AND INTRAOCULAR LENS PLACEMENT (IOC);  Surgeon: Elta Guadeloupe T. Gershon Crane, MD;  Location: [facility];  Service: Ophthalmology;  Laterality: Left;  CDE 8.60  ? CHOLECYSTECTOMY    ? 3/18  ? COLON SURGERY    ? INSERT / REPLACE / REMOVE PACEMAKER    ? JOINT REPLACEMENT    ? bilateral TKA  ? LOWER EXTREMITY ANGIOGRAPHY Left 10/14/2020  ? Procedure: LOWER EXTREMITY ANGIOGRAPHY;  Surgeon: Katha Cabal, MD;  Location: [facility];  Service: Cardiovascular;  Laterality: Left;  ? LOWER EXTREMITY ANGIOGRAPHY Right 09/08/2021  ? Procedure: LOWER EXTREMITY ANGIOGRAPHY;  Surgeon: Katha Cabal, MD;  Location: [facility];  Service: Cardiovascular;  Laterality: Right;  ? PACEMAKER INSERTION N/A 12/24/2015  ? Procedure: INSERTION PACEMAKER;   Surgeon: Isaias Cowman, MD;  Location: [facility];  Service: Cardiovascular;  Laterality: N/A;  ? ROTATOR CUFF REPAIR    ? bilateral  ? VIDEO BRONCHOSCOPY WITH ENDOBRONCHIAL NAVIGATION Right 11/11/2021  ? Procedure: VIDEO BRONCHOSCOPY WITH ENDOBRONCHIAL NAVIGATION;  Surgeon: Ottie Glazier, MD;  Location: [facility];  Service: Thoracic;  Laterality: Right;  ? ? ?SOCIAL HISTORY: ?Social History  ? ?Socioeconomic History  ? Marital status: Single  ?  Spouse name: Not on file  ? Number of children: 2  ? Years of education: 58  ? Highest education level: 12th grade  ?Occupational History  ? Occupation: retired  ?Tobacco Use  ? Smoking status: Former  ?  Packs/day: 1.25  ?  Years: 40.00  ?  Pack years: 50.00  ?  Types: Cigarettes  ?  Quit date: 12/16/1998  ?  Years since quitting: 22.9  ? Smokeless tobacco: Never  ?Vaping Use  ? Vaping Use: Never used  ?Substance and Sexual Activity  ? Alcohol use: No  ?  Alcohol/week: 0.0 standard drinks  ? Drug use: Yes  ?  Types: Oxycodone  ? Sexual activity: Not Currently  ?  Birth control/protection: Surgical  ?  Other Topics Concern  ? Not on file  ?Social History Narrative  ? Not on file  ? ?Social Determinants of Health  ? ?Financial Resource Strain: Low Risk   ? Difficulty of Paying Living Expenses: Not hard at all  ?Food Insecurity: No Food Insecurity  ? Worried About Charity fundraiser in the Last Year: Never true  ? Ran Out of Food in the Last Year: Never true  ?Transportation Needs: No Transportation Needs  ? Lack of Transportation (Medical): No  ? Lack of Transportation (Non-Medical): No  ?Physical Activity: Inactive  ? Days of Exercise per Week: 0 days  ? Minutes of Exercise per Session: 0 min  ?Stress: No Stress Concern Present  ? Feeling of Stress : Not at all  ?Social Connections: Moderately Isolated  ? Frequency of Communication with Friends and Family: More than three times a week  ? Frequency of Social Gatherings with Friends and Family: More than three times a week   ? Attends Religious Services: More than 4 times per year  ? Active Member of Clubs or Organizations: No  ? Attends Archivist Meetings: Never  ? Marital Status: Never married  ?Intimate Partner

## 2021-12-02 NOTE — Progress Notes (Signed)
Met with patient during follow up visit with Dr. Brahmanday. All questions answered during visit. Reviewed upcoming appts. Instructed to call with any questions or needs. Pt verbalized understanding. ?

## 2021-12-02 NOTE — Assessment & Plan Note (Addendum)
#  Small cell lung cancer-T2N1-stage II/limited stage.  CT brain with and without contrast negative.  PET scan no evidence of any distant site disease. ? ?Discussed that unfortunately small cell lung cancers are extremely aggressive changes.  Even with full chemoradiation protocol only 10 to 20% of small cell lung cancers can be cured.  Patient is unfortunately not a candidate for any systemic chemotherapy especially with concurrent radiation.  Recommend radiation on a palliative basis.  Patient and her granddaughter understand that unfortunately patient likely will not be cured of her malignancy; however radiation is supposed to help control the disease.  We will be repeating an imaging 2 to 3 months post radiation. ? ?#S/p evaluation with radiation oncology; awaiting 10 fractions of radiation starting 4/18.  ? ?# Left ankle lateral pressure sore [s/p home health] ? ?#Chronic respiratory failure COPD-Home O2 2 L/CHF stable. ? ?# DISPOSITION: ?# follow up in 1 months;MD;  labs- cbc/cmp/ldh-Dr.B ? ?# I reviewed the blood work- with the patient in detail; also reviewed the imaging independently [as summarized above]; and with the patient in detail.  Discussed with Hildred Alamin. ? ?

## 2021-12-02 NOTE — Chronic Care Management (AMB) (Signed)
?  Chronic Care Management  ? ?Note ? ?12/02/2021 ?Name: [name] MRN: [id number] DOB: 02/28/1930 ? ?Kristin [name] is a 88 y.o. year old female who is a primary care patient of Olin Hauser, DO. Kristin [name] is currently enrolled in care management services. An additional referral for LCSW was placed.  ? ?Follow up plan: ?Telephone appointment with care management team member scheduled for:12/15/2021 ? ?Noreene Larsson, RMA ?Care Guide, Embedded Care Coordination ?[facility]  ?[place], [place] [address] ?Direct Dial: [phone] ?[name] ?Website: [email or web address]  ? ?

## 2021-12-03 ENCOUNTER — Telehealth: Payer: Self-pay | Admitting: Nurse Practitioner

## 2021-12-03 ENCOUNTER — Ambulatory Visit (INDEPENDENT_AMBULATORY_CARE_PROVIDER_SITE_OTHER): Payer: Medicare HMO | Admitting: Family Medicine

## 2021-12-03 ENCOUNTER — Encounter: Payer: Self-pay | Admitting: Nurse Practitioner

## 2021-12-03 ENCOUNTER — Encounter: Payer: Self-pay | Admitting: Family Medicine

## 2021-12-03 VITALS — BP 118/78 | HR 61 | Ht 67.0 in | Wt 165.0 lb

## 2021-12-03 DIAGNOSIS — R296 Repeated falls: Secondary | ICD-10-CM

## 2021-12-03 DIAGNOSIS — C771 Secondary and unspecified malignant neoplasm of intrathoracic lymph nodes: Secondary | ICD-10-CM | POA: Diagnosis not present

## 2021-12-03 DIAGNOSIS — C3490 Malignant neoplasm of unspecified part of unspecified bronchus or lung: Secondary | ICD-10-CM

## 2021-12-03 DIAGNOSIS — Z510 Encounter for antineoplastic radiation therapy: Secondary | ICD-10-CM | POA: Diagnosis not present

## 2021-12-03 DIAGNOSIS — Z87891 Personal history of nicotine dependence: Secondary | ICD-10-CM | POA: Diagnosis not present

## 2021-12-03 DIAGNOSIS — C3411 Malignant neoplasm of upper lobe, right bronchus or lung: Secondary | ICD-10-CM | POA: Diagnosis not present

## 2021-12-03 DIAGNOSIS — E46 Unspecified protein-calorie malnutrition: Secondary | ICD-10-CM | POA: Diagnosis not present

## 2021-12-03 DIAGNOSIS — R5381 Other malaise: Secondary | ICD-10-CM | POA: Diagnosis not present

## 2021-12-03 DIAGNOSIS — Z515 Encounter for palliative care: Secondary | ICD-10-CM | POA: Diagnosis not present

## 2021-12-03 DIAGNOSIS — S0101XA Laceration without foreign body of scalp, initial encounter: Secondary | ICD-10-CM

## 2021-12-03 NOTE — Progress Notes (Addendum)
? ? ?Manufacturing engineer ?Community Palliative Care Consult Note ?Telephone: [phone]  ?Fax: [phone]  ? ?Date of encounter: 12/03/21 ?4:12 PM ?PATIENT NAME: Kristin [name] ?[place] [address]   ?[phone] (home)  ?DOB: November 03, 1929 ?MRN: [id number] ?PRIMARY CARE PROVIDER:    ?Olin Hauser, DO,  ?[address] ?[place] [address] ?[phone] ? ?REFERRING PROVIDER:   ?Olin Hauser, DO ?[address] ?[place],  [place] [address] ?[phone] ? ?RESPONSIBLE PARTY:    ?Contact Information   ? ? Name Relation Home Work Mobile  ? [place], Barren Son [phone]  [phone]  ? Marcellina Millin Granddaughter [phone]    ? ?  ? ?Due to the COVID-19 crisis, this visit was done via telemedicine from my office and it was initiated and consent by this patient and or family. ? ?I connected with  Kristin [name] OR PROXY on 12/03/21 by a video enabled telemedicine application and verified that I am speaking with the correct person. ?  ?I discussed the limitations of evaluation and management by telemedicine. The patient expressed understanding and agreed to proceed. Palliative Care was asked to follow this patient by consultation request of  [name] * to address advance care planning and complex medical decision making. This is the initial visit.                       ?ASSESSMENT AND PLAN / RECOMMENDATIONS:  ?Symptom Management/Plan: ?1. Advance Care Planning;  Ongoing discussions, will re-visit next PC visit. ? ?2. Protein calorie malnutrition; discussed at length about nutrition, supplements, foods she likes to eat. Encouraged to eat with radiation treatments upcoming. Left ankle pressure sore followed by home health ?Weight 165.3 lbs (75 kg) ?Height 5'7" (67 inches) ?BMI 25.87 ? ?3. Debility secondary to Small cell lung cancer-T2N1-stage II/limited stage ? ?Order for Hospital bed with gel mattress ?Weight 165.3 lbs (75 kg) ?Height 5'7" (67  inches) ?Dx:  ?(E44.0) Protein Calorie Malnutrition ?(54.0) Debility ?(M62.81) Generalized weakness ?(34.91) Small cell lung cancer ?(Z99.81) Oxygen dependency ?(R26) unsteady gait ?(W01.OXXA) frequent falls ?(L89.529) Pressure ulcer left ankle ?(J44.9) COPD ?(I50.22) CHF ?Contact person please call prior to schedule delivery to discuss fee:  ?Ronald Lobo [phone]  ? ?Order for light weight wheelchair ?Weight 165.3 lbs (75 kg) ?Height 5'7" (67 inches) ?Dx:  ?(E44.0) Protein Calorie Malnutrition ?(54.0) Debility ?(M62.81) Generalized weakness ?(34.91) Small cell lung cancer ?(Z99.81) Oxygen dependency ?(R26) unsteady gait ?(W01.OXXA) frequent falls ?(L89.529) Pressure ulcer left ankle ?(J44.9) COPD ?(I50.22) CHF ?Contact person please call prior to schedule delivery to discuss fee:  ?Ronald Lobo [phone]  ? ?4. Goals of Care: Goals include to maximize quality of life and symptom management. Our advance care planning conversation included a discussion about:    ?The value and importance of advance care planning  ?Exploration of personal, cultural or spiritual beliefs that might influence medical decisions  ?Exploration of goals of care in the event of a sudden injury or illness  ?Identification and preparation of a healthcare agent  ?Review and updating or creation of an advance directive document. ? ?5. Palliative care encounter; Palliative care encounter; Palliative medicine team will continue to support patient, patient's family, and medical team. Visit consisted of counseling and education dealing with the complex and emotionally intense issues of symptom management and palliative care in the setting of serious and potentially life-threatening illness ? ?Follow up Palliative Care Visit: Palliative care will continue to follow for complex medical decision  making, advance care planning, and clarification of goals. Return 2 weeks or prn. ? ?I spent 61 minutes providing this  consultation. More than 50% of the time in this consultation was spent in counseling and care coordination. ?PPS: 40% ? ?Chief Complaint: Initial palliative consult for complex medical decision making ? ?HISTORY OF PRESENT ILLNESS:  Kristin [name] is a 88 y.o. year old female  with multiple medical problems including Small Cell lung cancer, CHF, O2 dependent 2L/[place], COPD, afib on Eliquis, h/o NSTEMI, h/o falls, protein calorie malnutrition, arthritis, chronic pain syndrome multiple sites, h/o Overdose opiate accidental/unintentional (10/02/2020), h/o right breast cancer s/p lumpectomy with chemo/XRT, CKD, h/o cystocele, over actice bladder, HTN, HLD, RLS, gout, h/o hepatitis C, vaginal atrophy. I connected with Kristin [name], Kristin [name] son, Kristin [name] with granddaughter, Kristin [name]. We talked about past medical history, last time Kristin [name] was independent at home. We talked about when Kristin [name] moved in with Kristin [name]. We talked about functionally she ambulates with a walker, h/o falls. We talked about Kristin [name] does do her own bathing, dressing though takes time. She feeds herself with declined appetite. We talked about foods, nutrition, supplements. We talked about last visit with Oncology with upcoming radiation treatments. We talked about lung cancer and impact on her quality of life. We talked about radiation is palliative treatment. We talked about age with progression as [name] is 88 years old. We talked about medical goals. We talked about needs of w/c and hospital bed. We talked about wound left ankle. We talked about daily routine. We talked about role pc in poc. Life, social, family review. We talked about family dynamics, Kristin [name] helps frequently residing next door. We talked about f/u PC visit, scheduled. Therapeutic listening, emotional support provided. Questions answered.  ? ?Followed by Dr Dossie Arbour for chronic pain with last visit 11/18/2021 for chronic pain syndrome, chronic low back pain with sciatica,  chronic lower extremity pain, chronic cervical radicular pain. Current regimen continued  ?Tylenol 500mg  BID ?Voltaren gel 1%; apply 4g BID ?Gabapentin 300 mg TID ? ?Followed by Dr Rogue Bussing 12/02/2021 for malignant neoplasm of right upper lobe of lung. Plan is for palliative radiation as she is not a candidate for any systemic chemotherapy.  ? ? ?Oncology History  ?Malignant neoplasm of right upper lobe of lung ([place])  ?12/02/2021 Initial Diagnosis  ?  Malignant neoplasm of right upper lobe of lung ([place]) ?   ?12/02/2021 Cancer Staging  ?  Staging form: Lung, AJCC 8th Edition ?- Clinical: Stage IIB (cT2, cN1, cM0) - Signed by Cammie Sickle, MD on 12/02/2021 ?Histopathologic type: Small cell carcinoma, NOS  ? ? ? ?History obtained from review of EMR, discussion with son, Kristin [name], granddaughter Kristin [name] with Kristin [name].  ?I reviewed available labs, medications, imaging, studies and related documents from the EMR.  Records reviewed and summarized above.  ? ?ROS ?10 point system reviewed all negative except HPI ? ?Physical Exam: ?deferred ?CURRENT PROBLEM LIST:  ?Patient Active Problem List  ? Diagnosis Date Noted  ? Malignant neoplasm of right upper lobe of lung ([place]) 12/02/2021  ? Small cell lung cancer ([place]) 11/17/2021  ? Goals of care, counseling/discussion 11/17/2021  ? Overweight (BMI 25.0-29.9) 11/11/2021  ? Bacteremia 11/06/2021  ? CAP (community acquired pneumonia) 11/05/2021  ? Postobstructive pneumonia 11/05/2021  ? Mass of upper lobe of right lung 11/05/2021  ? Sepsis due to Escherichia coli (E. coli) ([place]) 11/05/2021  ? Dyslipidemia 11/05/2021  ? COPD with acute exacerbation (  [place]) 09/20/2021  ? Acute on chronic diastolic CHF (congestive heart failure) ([place]) 09/19/2021  ? Lymphedema 09/17/2021  ? Ischemic leg pain ([place]) 08/19/2021  ? Paroxysmal atrial fibrillation ([facility])   ? History of atrial fibrillation 04/08/2021  ? PVD (peripheral vascular disease) ([place]) 04/08/2021  ? Low blood pressure  reading 04/07/2021  ? Physical tolerance to opiate drug 01/28/2021  ? Chronic use of opiate for therapeutic purpose 11/24/2020  ? Atherosclerosis of native arteries of the extremities with ulceration ([place]) 03/14/

## 2021-12-03 NOTE — Patient Instructions (Addendum)
? ?  Please schedule a Follow-up Appointment to: Return if symptoms worsen or fail to improve. ? ?If you have any other questions or concerns, please feel free to call the office or send a message through [facility]. You may also schedule an earlier appointment if necessary. ? ?Additionally, you may be receiving a survey about your experience at our office within a few days to 1 week by e-mail or mail. We value your feedback. ? ?[name], DO ?[place] [facility] ?

## 2021-12-03 NOTE — Progress Notes (Signed)
? ?Subjective:  ? ? [name], female    DOB: 11-Jul-1930, 88 y.o.   MRN: [id number] ? ?Kristin [name] is a 88 y.o. female presenting on 12/03/2021 for Suture / Staple Removal ? ? ?HPI ? ?Reviewed hospital ED records  ?ED 3/31 fall injury, staples x 2 placed in scalp on laceration. ?ED 11/25/21 - Weakness, treated for CHF ?Followed by Rad Onc will start Radiation therapy ?Recently referred to Palliative Care ?Issues with falls at home, increasing help and assistance in near future ?Today here for staple removal, wound is healed on scalp. ? ? ? ? ?  11/16/2021  ?  9:49 AM 06/09/2021  ? 10:45 AM 03/04/2021  ?  3:14 PM  ?Depression screen PHQ 2/9  ?Decreased Interest 0 0 0  ?Down, Depressed, Hopeless 0 0 0  ?PHQ - 2 Score 0 0 0  ? ? ?Social History  ? ?Tobacco Use  ? Smoking status: Former  ?  Packs/day: 1.25  ?  Years: 40.00  ?  Pack years: 50.00  ?  Types: Cigarettes  ?  Quit date: 12/16/1998  ?  Years since quitting: 22.9  ? Smokeless tobacco: Never  ?Vaping Use  ? Vaping Use: Never used  ?Substance Use Topics  ? Alcohol use: No  ?  Alcohol/week: 0.0 standard drinks  ? Drug use: Yes  ?  Types: Oxycodone  ? ? ?Review of Systems ?Per HPI unless specifically indicated above ? ?   ?Objective:  ?  ?BP 118/78   Pulse 61   Ht 5\' 7"  (1.702 m)   Wt 165 lb (74.8 kg)   SpO2 100%   BMI 25.84 kg/m?   ?Wt Readings from Last 3 Encounters:  ?12/03/21 165 lb (74.8 kg)  ?12/02/21 165 lb 3.2 oz (74.9 kg)  ?11/25/21 163 lb 5.8 oz (74.1 kg)  ?  ?Physical Exam ?Vitals and nursing note reviewed.  ?Constitutional:   ?   General: She is not in acute distress. ?   Appearance: She is well-developed. She is not diaphoretic.  ?   Comments: Well-appearing elderly 88 year female, comfortable, cooperative  ?HENT:  ?   Head: Normocephalic and atraumatic.  ?   Comments: Scalp with healed laceration, no edema non tender, no oozing. 2 staples uncomplicated ?Eyes:  ?   General:     ?   Right eye: No discharge.     ?   Left eye: No discharge.   ?   Conjunctiva/sclera: Conjunctivae normal.  ?Neck:  ?   Thyroid: No thyromegaly.  ?Cardiovascular:  ?   Rate and Rhythm: Normal rate and regular rhythm.  ?   Heart sounds: Normal heart sounds. No murmur heard. ?Pulmonary:  ?   Effort: Pulmonary effort is normal. No respiratory distress.  ?   Breath sounds: Normal breath sounds. No wheezing or rales.  ?Musculoskeletal:     ?   General: Normal range of motion.  ?   Cervical back: Normal range of motion and neck supple.  ?   Right lower leg: Edema (+1 to trace pitting edema bilateral, stable to improved.) present.  ?   Left lower leg: Edema present.  ?   Comments: Legs non tender today  ?Lymphadenopathy:  ?   Cervical: No cervical adenopathy.  ?Skin: ?   General: Skin is warm and dry.  ?   Findings: No erythema or rash.  ?Neurological:  ?   Mental Status: She is alert and oriented to person, place, and  time.  ?Psychiatric:     ?   Behavior: Behavior normal.  ?   Comments: Well groomed, good eye contact, normal speech and thoughts  ? ? ?________________________________________________________ ?PROCEDURE NOTE ?Date - 12/03/21 ?Staple Removal - scalp location ?- Location / Date Staples were placed:  [facility] ED 11/20/21 ?- # of Staples: 2 ?Discussed benefits and risks (including pain, bleeding, infection, wound separation). ?Verbal consent given by patient ?Procedure performed by: Dr Parks Ranger ?Medication:  None ?Examination of the stapled laceration on scalp appears to be well healed. Appropriate # of staples counted and confirmed. Removal of staples one by one using sterile staple remover. Removal was uncomplicated. ?- All staples were successfully removed and wound remains intact ?- 2 out of 2 staples were successfully removed ? ? ?Results for orders placed or performed during the hospital encounter of 11/25/21  ?Resp Panel by RT-PCR (Flu A&B, Covid) Nasopharyngeal Swab  ? Specimen: Nasopharyngeal Swab; Nasopharyngeal(NP) swabs in vial transport medium  ?Result Value  Ref Range  ? SARS Coronavirus 2 by RT PCR NEGATIVE NEGATIVE  ? Influenza A by PCR NEGATIVE NEGATIVE  ? Influenza B by PCR NEGATIVE NEGATIVE  ?CBC  ?Result Value Ref Range  ? WBC 8.7 4.0 - 10.5 K/uL  ? RBC 5.18 (H) 3.87 - 5.11 MIL/uL  ? Hemoglobin 14.1 12.0 - 15.0 g/dL  ? HCT 45.6 36.0 - 46.0 %  ? MCV 88.0 80.0 - 100.0 fL  ? MCH 27.2 26.0 - 34.0 pg  ? MCHC 30.9 30.0 - 36.0 g/dL  ? RDW 15.4 11.5 - 15.5 %  ? Platelets 157 150 - 400 K/uL  ? nRBC 0.0 0.0 - 0.2 %  ?Comprehensive metabolic panel  ?Result Value Ref Range  ? Sodium 138 135 - 145 mmol/L  ? Potassium 4.7 3.5 - 5.1 mmol/L  ? Chloride 103 98 - 111 mmol/L  ? CO2 25 22 - 32 mmol/L  ? Glucose, Bld 98 70 - 99 mg/dL  ? BUN 33 (H) 8 - 23 mg/dL  ? Creatinine, Ser 1.63 (H) 0.44 - 1.00 mg/dL  ? Calcium 9.4 8.9 - 10.3 mg/dL  ? Total Protein 8.0 6.5 - 8.1 g/dL  ? Albumin 3.8 3.5 - 5.0 g/dL  ? AST 33 15 - 41 U/L  ? ALT 18 0 - 44 U/L  ? Alkaline Phosphatase 85 38 - 126 U/L  ? Total Bilirubin 1.4 (H) 0.3 - 1.2 mg/dL  ? GFR, Estimated 30 (L) >60 mL/min  ? Anion gap 10 5 - 15  ?Urinalysis, Complete w Microscopic Urine, Clean Catch  ?Result Value Ref Range  ? Color, Urine YELLOW (A) YELLOW  ? APPearance CLEAR (A) CLEAR  ? Specific Gravity, Urine 1.025 1.005 - 1.030  ? pH 8.0 5.0 - 8.0  ? Glucose, UA NEGATIVE NEGATIVE mg/dL  ? Hgb urine dipstick NEGATIVE NEGATIVE  ? Bilirubin Urine NEGATIVE NEGATIVE  ? Ketones, ur NEGATIVE NEGATIVE mg/dL  ? Protein, ur NEGATIVE NEGATIVE mg/dL  ? Nitrite NEGATIVE NEGATIVE  ? Leukocytes,Ua NEGATIVE NEGATIVE  ? RBC / HPF 6-10 0 - 5 RBC/hpf  ? WBC, UA 0-5 0 - 5 WBC/hpf  ? Bacteria, UA NONE SEEN NONE SEEN  ? Squamous Epithelial / LPF 0-5 0 - 5  ? ?   ?Assessment & Plan:  ? ?Problem List Items Addressed This Visit   ?None ?Visit Diagnoses   ? ? Recurrent falls    -  Primary  ? Laceration of scalp, initial encounter      ? ?  ?  ?  Reviewed prior ED visit ?Has increased resources, prior referral to CCM care management, goal to arrange Home  Health ?Palliative care consult is today w [facility] ?Starting upcoming Radiation therapy ?Removed staples from scalp x 2 today uncomplicated ? ?No orders of the defined types were placed in this encounter. ? ? ? ? ?Follow up plan: ?No follow-ups on file. ? ? ?[name], DO ?[facility] ?[facility] ?12/03/2021, 10:12 AM ?

## 2021-12-04 ENCOUNTER — Telehealth: Payer: Self-pay

## 2021-12-04 ENCOUNTER — Other Ambulatory Visit: Payer: Self-pay

## 2021-12-04 DIAGNOSIS — C3411 Malignant neoplasm of upper lobe, right bronchus or lung: Secondary | ICD-10-CM

## 2021-12-04 DIAGNOSIS — C3490 Malignant neoplasm of unspecified part of unspecified bronchus or lung: Secondary | ICD-10-CM

## 2021-12-04 NOTE — Progress Notes (Signed)
PC SW faxed DME order for hospital bed w/gel overlay mattress and WC to [employer] [facility], per [facility] NP - C. Gusler request. ?

## 2021-12-04 NOTE — Telephone Encounter (Signed)
DME orders placed for wheelchair and hospital bed per Dr. Sharmaine Base request. Sent a community message to Andree Coss to make him aware as well.  ?

## 2021-12-07 ENCOUNTER — Telehealth: Payer: Self-pay | Admitting: Family Medicine

## 2021-12-07 ENCOUNTER — Encounter: Payer: Self-pay | Admitting: *Deleted

## 2021-12-07 NOTE — Telephone Encounter (Signed)
This encounter was created in error - please disregard.

## 2021-12-07 NOTE — Telephone Encounter (Signed)
Sure, she can increase Lasix today, instead of 40mg  x 1 dose, she can repeat a dose if need. ? ?I agree, concern with fluid swelling breathing would warrant hospital evaluation if cannot get fluid off with pills, sometimes needs IV medicine.  ? ?They can call Palliative Care [facility] to do home consult as well. ? ?[name], DO ?[facility] ?[facility] ?12/07/2021, 2:31 PM ? ?

## 2021-12-07 NOTE — Telephone Encounter (Signed)
Attempted to call Kristin [name]- no answer-left message will forward message to provider  ?

## 2021-12-07 NOTE — Telephone Encounter (Signed)
Carlota Raspberry calling from [facility] is calling to report that the pt has increase wheezing with 3-4lb weight gain. Vinnie Level is also report edema lower extremities. ?O2 is running 91-92% at rest. ? ?Is an extra lasix needed? ? ?[phone] ?

## 2021-12-07 NOTE — Telephone Encounter (Signed)
Dr. Raliegh Ip patient, will defer to him ?

## 2021-12-07 NOTE — Telephone Encounter (Signed)
Carlota Raspberry from [facility] returned call and requesting if patient should take extra dose of lasix today due to 3-4 lb weight gain since this weekend and bilateral LE's edema and wheezing. Ms. Ronnald Ramp not with patient at this time . Recommended if patient c/o chest pain difficulty breathing go to ED. Ms. Ronnald Ramp reports patient not in distress. Please advise.  ?

## 2021-12-08 ENCOUNTER — Encounter: Payer: Self-pay | Admitting: *Deleted

## 2021-12-08 ENCOUNTER — Ambulatory Visit: Payer: Medicare HMO

## 2021-12-08 ENCOUNTER — Inpatient Hospital Stay: Payer: Medicare HMO | Admitting: Oncology

## 2021-12-08 ENCOUNTER — Other Ambulatory Visit: Payer: Self-pay

## 2021-12-08 ENCOUNTER — Encounter: Payer: Self-pay | Admitting: Family

## 2021-12-08 ENCOUNTER — Ambulatory Visit
Admission: RE | Admit: 2021-12-08 | Discharge: 2021-12-08 | Disposition: A | Discharge status: Home / Self Care | Payer: Medicare HMO | Source: Ambulatory Visit | Attending: Radiation Oncology | Admitting: Radiation Oncology

## 2021-12-08 DIAGNOSIS — C771 Secondary and unspecified malignant neoplasm of intrathoracic lymph nodes: Secondary | ICD-10-CM | POA: Diagnosis not present

## 2021-12-08 DIAGNOSIS — C3411 Malignant neoplasm of upper lobe, right bronchus or lung: Secondary | ICD-10-CM | POA: Diagnosis not present

## 2021-12-08 DIAGNOSIS — Z510 Encounter for antineoplastic radiation therapy: Secondary | ICD-10-CM | POA: Diagnosis not present

## 2021-12-08 DIAGNOSIS — Z87891 Personal history of nicotine dependence: Secondary | ICD-10-CM | POA: Diagnosis not present

## 2021-12-08 LAB — RAD ONC ARIA SESSION SUMMARY
Course Elapsed Days: 0
Plan Fractions Treated to Date: 1
Plan Prescribed Dose Per Fraction: 6 Gy
Plan Total Fractions Prescribed: 10
Plan Total Prescribed Dose: 60 Gy
Reference Point Dosage Given to Date: 6 Gy
Reference Point Session Dosage Given: 6 Gy
Session Number: 1

## 2021-12-09 ENCOUNTER — Ambulatory Visit
Admission: RE | Admit: 2021-12-09 | Discharge: 2021-12-09 | Disposition: A | Discharge status: Home / Self Care | Payer: Medicare HMO | Source: Ambulatory Visit | Attending: Radiation Oncology | Admitting: Radiation Oncology

## 2021-12-09 ENCOUNTER — Other Ambulatory Visit: Payer: Self-pay

## 2021-12-09 DIAGNOSIS — C3411 Malignant neoplasm of upper lobe, right bronchus or lung: Secondary | ICD-10-CM | POA: Diagnosis not present

## 2021-12-09 DIAGNOSIS — C771 Secondary and unspecified malignant neoplasm of intrathoracic lymph nodes: Secondary | ICD-10-CM | POA: Diagnosis not present

## 2021-12-09 DIAGNOSIS — Z87891 Personal history of nicotine dependence: Secondary | ICD-10-CM | POA: Diagnosis not present

## 2021-12-09 DIAGNOSIS — Z510 Encounter for antineoplastic radiation therapy: Secondary | ICD-10-CM | POA: Diagnosis not present

## 2021-12-09 LAB — RAD ONC ARIA SESSION SUMMARY
Course Elapsed Days: 1
Plan Fractions Treated to Date: 2
Plan Prescribed Dose Per Fraction: 6 Gy
Plan Total Fractions Prescribed: 10
Plan Total Prescribed Dose: 60 Gy
Reference Point Dosage Given to Date: 12 Gy
Reference Point Session Dosage Given: 6 Gy
Session Number: 2

## 2021-12-10 ENCOUNTER — Other Ambulatory Visit: Payer: Self-pay

## 2021-12-10 ENCOUNTER — Ambulatory Visit
Admission: RE | Admit: 2021-12-10 | Discharge: 2021-12-10 | Disposition: A | Discharge status: Home / Self Care | Payer: Medicare HMO | Source: Ambulatory Visit | Attending: Radiation Oncology | Admitting: Radiation Oncology

## 2021-12-10 ENCOUNTER — Ambulatory Visit: Payer: Medicare HMO | Attending: Family | Admitting: Family

## 2021-12-10 ENCOUNTER — Other Ambulatory Visit: Payer: Self-pay | Admitting: Family

## 2021-12-10 ENCOUNTER — Encounter: Payer: Self-pay | Admitting: Family

## 2021-12-10 VITALS — BP 144/66 | HR 61 | Resp 16 | Ht 67.0 in | Wt 168.0 lb

## 2021-12-10 DIAGNOSIS — I739 Peripheral vascular disease, unspecified: Secondary | ICD-10-CM

## 2021-12-10 DIAGNOSIS — N189 Chronic kidney disease, unspecified: Secondary | ICD-10-CM | POA: Insufficient documentation

## 2021-12-10 DIAGNOSIS — I129 Hypertensive chronic kidney disease with stage 1 through stage 4 chronic kidney disease, or unspecified chronic kidney disease: Secondary | ICD-10-CM

## 2021-12-10 DIAGNOSIS — C3490 Malignant neoplasm of unspecified part of unspecified bronchus or lung: Secondary | ICD-10-CM | POA: Diagnosis not present

## 2021-12-10 DIAGNOSIS — I5033 Acute on chronic diastolic (congestive) heart failure: Secondary | ICD-10-CM

## 2021-12-10 DIAGNOSIS — Z853 Personal history of malignant neoplasm of breast: Secondary | ICD-10-CM | POA: Insufficient documentation

## 2021-12-10 DIAGNOSIS — M109 Gout, unspecified: Secondary | ICD-10-CM | POA: Diagnosis not present

## 2021-12-10 DIAGNOSIS — I130 Hypertensive heart and chronic kidney disease with heart failure and stage 1 through stage 4 chronic kidney disease, or unspecified chronic kidney disease: Secondary | ICD-10-CM | POA: Diagnosis not present

## 2021-12-10 DIAGNOSIS — Z87891 Personal history of nicotine dependence: Secondary | ICD-10-CM | POA: Diagnosis not present

## 2021-12-10 DIAGNOSIS — E785 Hyperlipidemia, unspecified: Secondary | ICD-10-CM | POA: Insufficient documentation

## 2021-12-10 DIAGNOSIS — N183 Chronic kidney disease, stage 3 unspecified: Secondary | ICD-10-CM | POA: Diagnosis not present

## 2021-12-10 DIAGNOSIS — J449 Chronic obstructive pulmonary disease, unspecified: Secondary | ICD-10-CM | POA: Diagnosis not present

## 2021-12-10 DIAGNOSIS — C3411 Malignant neoplasm of upper lobe, right bronchus or lung: Secondary | ICD-10-CM

## 2021-12-10 DIAGNOSIS — I480 Paroxysmal atrial fibrillation: Secondary | ICD-10-CM | POA: Insufficient documentation

## 2021-12-10 LAB — RAD ONC ARIA SESSION SUMMARY
Course Elapsed Days: 2
Plan Fractions Treated to Date: 3
Plan Prescribed Dose Per Fraction: 6 Gy
Plan Total Fractions Prescribed: 10
Plan Total Prescribed Dose: 60 Gy
Reference Point Dosage Given to Date: 18 Gy
Reference Point Session Dosage Given: 6 Gy
Session Number: 3

## 2021-12-10 NOTE — Progress Notes (Signed)
[name], female    DOB: 11-23-1929, 88 y.o.   MRN: [id number] ? ?HPI ? ?Kristin [name] is a 88 y/o female with a history of atrial fibrillation, right breast cancer, chronic back pain, CKD, COPD, gout, hepatitis, hyperlipidemia, HTN, hypomagnesium and chronic heart failure ? ?Echo report from 05/24/20 reviewed and showed an EF of 60-65% along with mild LVH and moderate LAE. Reviewed echo report on 10/29/15 which showed an EF of 55-65% with moderate MR.  ? ?Was in the ED 11/25/21 due to mechanical fall. Head CT was negative. IVF given for mild dehydration and she was released. Was in the ED 11/20/21 due to mechanical fall resulting in head laceration. Head CT negative. Laceration stapled and she was released.                               ? ?Patient presents today for a follow-up visit with a chief complaint of moderate shortness of breath with minimal exertion. Says that this has worsened over the last few days. She has associated fatigue, cough, wheezing, pedal edema (worsening), weight gain and back pain along with this. She denies any dizziness, difficulty sleeping, abdominal distention, palpitations or chest pain.  ? ?She says that her swelling has been worsening over the last week. She has recently started radiation for lung cancer.  ? ?Past Medical History:  ?Diagnosis Date  ? Acute postoperative pain 02/09/2018  ? Arrhythmia, sinus node 04/03/2014  ? Arthritis   ? Arthritis   ? Atrial fibrillation ([place])   ? Back pain   ? lower back chronic  ? Bradycardia   ? Breast cancer ([place]) 2004  ? right breast cancer  ? Cancer [facility]) 2004  ? rt breast cancer-post lumpectomy- chemo/rad  ? CHF (congestive heart failure) ([place])   ? Chronic back pain   ? CKD (chronic kidney disease)   ? COPD (chronic obstructive pulmonary disease) ([place])   ? wears O2 at 2L via [place] at night  ? COVID-19 06/11/2020  ? Cystocele   ? Decubitus ulcers   ? Dehydration   ? Gout   ? Hematuria   ? Hepatitis C   ? Hyperlipidemia   ? Hypertension   ?  Hypomagnesemia 06/25/2015  ? NSTEMI (non-ST elevated myocardial infarction) ([place]) 04/08/2021  ? OAB (overactive bladder)   ? Overactive bladder   ? Overdose opiate, accidental or unintentional, sequela 10/02/2020  ? Restless leg   ? Shoulder pain, bilateral   ? Urinary frequency   ? UTI (lower urinary tract infection)   ? Vaginal atrophy   ? ?Past Surgical History:  ?Procedure Laterality Date  ? ABDOMINAL HYSTERECTOMY    ? BACK SURGERY    ? BREAST LUMPECTOMY Right 2004  ? BREAST SURGERY    ? rt lumpectomy  ? CARPAL TUNNEL RELEASE    ? CATARACT EXTRACTION W/PHACO  10/19/2011  ? Procedure: CATARACT EXTRACTION PHACO AND INTRAOCULAR LENS PLACEMENT (IOC);  Surgeon: Elta Guadeloupe T. Gershon Crane, MD;  Location: [facility];  Service: Ophthalmology;  Laterality: Right;  CDE:10.81  ? CATARACT EXTRACTION W/PHACO  11/02/2011  ? Procedure: CATARACT EXTRACTION PHACO AND INTRAOCULAR LENS PLACEMENT (IOC);  Surgeon: Elta Guadeloupe T. Gershon Crane, MD;  Location: [facility];  Service: Ophthalmology;  Laterality: Left;  CDE 8.60  ? CHOLECYSTECTOMY    ? 3/18  ? COLON SURGERY    ? INSERT / REPLACE / REMOVE PACEMAKER    ? JOINT REPLACEMENT    ?  bilateral TKA  ? LOWER EXTREMITY ANGIOGRAPHY Left 10/14/2020  ? Procedure: LOWER EXTREMITY ANGIOGRAPHY;  Surgeon: Katha Cabal, MD;  Location: [place] [facility];  Service: Cardiovascular;  Laterality: Left;  ? LOWER EXTREMITY ANGIOGRAPHY Right 09/08/2021  ? Procedure: LOWER EXTREMITY ANGIOGRAPHY;  Surgeon: Katha Cabal, MD;  Location: [place] [facility];  Service: Cardiovascular;  Laterality: Right;  ? PACEMAKER INSERTION N/A 12/24/2015  ? Procedure: INSERTION PACEMAKER;  Surgeon: Isaias Cowman, MD;  Location: [facility] OR[facility];  Service: Cardiovascular;  Laterality: N/A;  ? ROTATOR CUFF REPAIR    ? bilateral  ? VIDEO BRONCHOSCOPY WITH ENDOBRONCHIAL NAVIGATION Right 11/11/2021  ? Procedure: VIDEO BRONCHOSCOPY WITH ENDOBRONCHIAL NAVIGATION;  Surgeon: Ottie Glazier, MD;  Location: [facility] OR[facility];  Service: Thoracic;  Laterality:  Right;  ? ?Family History  ?Problem Relation Age of Onset  ? Kidney disease Brother   ? Heart disease Father   ? Heart disease Mother   ? Stroke Mother   ? Cancer Sister   ? Breast cancer Sister 10  ? Breast cancer Paternal Aunt   ? Anesthesia problems Neg Hx   ? Hypotension Neg Hx   ? Malignant hyperthermia Neg Hx   ? Pseudochol deficiency Neg Hx   ? ?Social History  ? ?Tobacco Use  ? Smoking status: Former  ?  Packs/day: 1.25  ?  Years: 40.00  ?  Pack years: 50.00  ?  Types: Cigarettes  ?  Quit date: 12/16/1998  ?  Years since quitting: 23.0  ? Smokeless tobacco: Never  ?Substance Use Topics  ? Alcohol use: No  ?  Alcohol/week: 0.0 standard drinks  ? ?Allergies  ?Allergen Reactions  ? Flexeril [Cyclobenzaprine Hcl]   ?  Severe Rash  ? Vancomycin Rash  ?  [place] Syndrome  ? ?Prior to Admission medications   ?Medication Sig Start Date End Date Taking? Authorizing Provider  ?acetaminophen (TYLENOL) 500 MG tablet Take 500 mg by mouth in the morning and at bedtime.   Yes [provider]  ?albuterol (PROVENTIL) (2.5 MG/3ML) 0.083% nebulizer solution USE 1 VIAL IN NEBULIZER EVERY 8 HOURS AS NEEDED FOR WHEEZING OR SHORTNESS OF BREATH. 10/20/21  Yes Karamalegos, Devonne Doughty, DO  ?apixaban (ELIQUIS) 2.5 MG TABS tablet Take 1 tablet (2.5 mg total) by mouth 2 (two) times daily. 04/09/21  Yes Wieting, [name], MD  ?atorvastatin (LIPITOR) 20 MG tablet TAKE 1 TABLET BY MOUTH ONCE DAILY FOR CHOLESTEROL 02/17/21  Yes Karamalegos, Devonne Doughty, DO  ?bisacodyl (DULCOLAX) 5 MG EC tablet Take 2 tablets (10 mg total) by mouth daily as needed for moderate constipation. 07/04/20  Yes Karamalegos, Devonne Doughty, DO  ?BREZTRI AEROSPHERE 160-9-4.8 MCG/ACT AERO Inhale 2 puffs into the lungs 2 (two) times daily. 07/07/21  Yes Olin Hauser, DO  ?Cholecalciferol (VITAMIN D) 2000 units tablet Take 2,000 Units by mouth daily.    Yes [provider]  ?diclofenac Sodium (VOLTAREN) 1 % GEL Apply 4 g topically 2 (two) times  daily as needed (As needed for pain). 10/15/21  Yes Kris Hartmann, NP  ?feeding supplement (ENSURE ENLIVE / ENSURE PLUS) LIQD Take 237 mLs by mouth 2 (two) times daily between meals. 11/13/21  Yes Annita Brod, MD  ?furosemide (LASIX) 40 MG tablet Take 1 tablet (40 mg total) by mouth daily. 09/23/21  Yes Nicole Kindred A, DO  ?gabapentin (NEURONTIN) 300 MG capsule Take 1 capsule (300 mg total) by mouth 3 (three) times daily. 11/12/21  Yes Annita Brod, MD  ?[facility]  ASPIRIN LOW DOSE 81 MG EC tablet TAKE 1 TABLET BY MOUTH ONCE DAILY. *SWALLOW WHOLE* 08/18/21  Yes Schnier, Dolores Lory, MD  ?Melatonin 10 MG TABS Take 10 mg by mouth at bedtime.   Yes [provider]  ?Multiple Vitamin (MULTIVITAMIN WITH MINERALS) TABS tablet Take 1 tablet by mouth daily. 11/13/21  Yes Annita Brod, MD  ?oxyCODONE (OXY IR/ROXICODONE) 5 MG immediate release tablet Take 2 tablets (10 mg total) by mouth 2 (two) times daily. May also take 1 tablet (5 mg total) at bedtime as needed for severe pain. Must last 30 days.. 11/23/21 12/23/21 Yes Milinda Pointer, MD  ?OXYGEN Inhale 2 L into the lungs daily.   Yes [provider]  ?rOPINIRole (REQUIP) 2 MG tablet TAKE 2 TABLETS BY MOUTH AT BEDTIME 10/21/21  Yes Karamalegos, Devonne Doughty, DO  ?levocetirizine (XYZAL) 5 MG tablet TAKE (1/2) TABLET BY MOUTH EVERY OTHER DAY. ?Patient not taking: Reported on 12/10/2021 06/19/21   Olin Hauser, DO  ? ?Review of Systems  ?Constitutional:  Positive for fatigue. Negative for appetite change.  ?HENT:  Negative for congestion, postnasal drip and sore throat.   ?Eyes: Negative.   ?Respiratory:  Positive for cough (in the mornings), shortness of breath (easily) and wheezing. Negative for chest tightness.   ?Cardiovascular:  Positive for leg swelling (for the last week). Negative for chest pain and palpitations.  ?Gastrointestinal:  Negative for abdominal distention and abdominal pain.  ?Endocrine: Negative.   ?Genitourinary:  Negative.   ?Musculoskeletal:  Positive for arthralgias (both shoulders) and back pain. Negative for neck stiffness.  ?Skin: Negative.   ?Allergic/Immunologic: Negative.   ?Neurological:  Negative for dizziness and

## 2021-12-10 NOTE — Patient Instructions (Addendum)
Continue weighing daily and call for an overnight weight gain of 3 pounds or more or a weekly weight gain of more than 5 pounds. ? ? ?If you have voicemail, please make sure your mailbox is cleaned out so that we may leave a message and please make sure to listen to any voicemails.  ? ? ?Come for IV lasix tomorrow at 12:30pm. Once you finish radiation, go to the [place] entrance and go to registration desk and tell them you need to get registered for IV lasix. You will then go to same day surgery on the 2nd floor ? ? ?

## 2021-12-11 ENCOUNTER — Telehealth: Payer: Self-pay | Admitting: Family Medicine

## 2021-12-11 ENCOUNTER — Ambulatory Visit
Admission: RE | Admit: 2021-12-11 | Discharge: 2021-12-11 | Disposition: A | Discharge status: Home / Self Care | Payer: Medicare HMO | Source: Ambulatory Visit | Attending: Family | Admitting: Family

## 2021-12-11 ENCOUNTER — Ambulatory Visit: Payer: Medicare HMO

## 2021-12-11 ENCOUNTER — Other Ambulatory Visit: Payer: Self-pay

## 2021-12-11 ENCOUNTER — Ambulatory Visit
Admission: RE | Admit: 2021-12-11 | Discharge: 2021-12-11 | Disposition: A | Discharge status: Home / Self Care | Payer: Medicare HMO | Source: Ambulatory Visit | Attending: Radiation Oncology | Admitting: Radiation Oncology

## 2021-12-11 DIAGNOSIS — I5033 Acute on chronic diastolic (congestive) heart failure: Secondary | ICD-10-CM | POA: Diagnosis not present

## 2021-12-11 DIAGNOSIS — C771 Secondary and unspecified malignant neoplasm of intrathoracic lymph nodes: Secondary | ICD-10-CM | POA: Diagnosis not present

## 2021-12-11 DIAGNOSIS — J45998 Other asthma: Secondary | ICD-10-CM | POA: Diagnosis not present

## 2021-12-11 DIAGNOSIS — C3411 Malignant neoplasm of upper lobe, right bronchus or lung: Secondary | ICD-10-CM | POA: Diagnosis not present

## 2021-12-11 DIAGNOSIS — Z87891 Personal history of nicotine dependence: Secondary | ICD-10-CM | POA: Diagnosis not present

## 2021-12-11 DIAGNOSIS — Z510 Encounter for antineoplastic radiation therapy: Secondary | ICD-10-CM | POA: Diagnosis not present

## 2021-12-11 LAB — RAD ONC ARIA SESSION SUMMARY
Course Elapsed Days: 3
Plan Fractions Treated to Date: 4
Plan Prescribed Dose Per Fraction: 6 Gy
Plan Total Fractions Prescribed: 10
Plan Total Prescribed Dose: 60 Gy
Reference Point Dosage Given to Date: 24 Gy
Reference Point Session Dosage Given: 6 Gy
Session Number: 4

## 2021-12-11 LAB — BRAIN NATRIURETIC PEPTIDE: B Natriuretic Peptide: 1129.5 pg/mL — ABNORMAL HIGH (ref 0.0–100.0)

## 2021-12-11 LAB — BASIC METABOLIC PANEL
Anion gap: 9 (ref 5–15)
BUN: 25 mg/dL — ABNORMAL HIGH (ref 8–23)
CO2: 22 mmol/L (ref 22–32)
Calcium: 9.3 mg/dL (ref 8.9–10.3)
Chloride: 108 mmol/L (ref 98–111)
Creatinine, Ser: 1.23 mg/dL — ABNORMAL HIGH (ref 0.44–1.00)
GFR, Estimated: 42 mL/min — ABNORMAL LOW (ref >60)
Glucose, Bld: 75 mg/dL (ref 70–99)
Potassium: 4.7 mmol/L (ref 3.5–5.1)
Sodium: 139 mmol/L (ref 135–145)

## 2021-12-11 MED ORDER — FUROSEMIDE 10 MG/ML IJ SOLN
INTRAMUSCULAR | Status: AC
Start: 2021-12-11 — End: 2021-12-11
  Administered 2021-12-11: 80 mg via INTRAVENOUS
  Filled 2021-12-11: qty 8

## 2021-12-11 MED ORDER — SODIUM CHLORIDE FLUSH 0.9 % IV SOLN
INTRAVENOUS | Status: AC
  Filled 2021-12-11: qty 10

## 2021-12-11 MED ORDER — FUROSEMIDE 10 MG/ML IJ SOLN: 80.0000 mg | Freq: Once | INTRAMUSCULAR | Status: AC

## 2021-12-11 MED ORDER — POTASSIUM CHLORIDE CRYS ER 20 MEQ PO TBCR: 40.0000 meq | EXTENDED_RELEASE_TABLET | Freq: Once | ORAL | Status: AC

## 2021-12-11 MED ORDER — POTASSIUM CHLORIDE CRYS ER 20 MEQ PO TBCR
EXTENDED_RELEASE_TABLET | ORAL | Status: AC
  Administered 2021-12-11: 40 meq via ORAL
  Filled 2021-12-11: qty 2

## 2021-12-11 NOTE — Telephone Encounter (Signed)
Neomia Dear, OT with [facility] is caling to report that the patient missed the appt today due to several appts today. And the pt is wipped out. Calling to ask can the visit be moved to next week. ?[phone] ?

## 2021-12-12 ENCOUNTER — Encounter: Payer: Self-pay | Admitting: Nurse Practitioner

## 2021-12-12 NOTE — Progress Notes (Signed)
[name], female    DOB: [birth year], 88 y.o.   MRN: [id number] ? ?HPI ? ?Kristin [name] is a 88 y/o female with a history of atrial fibrillation, right breast cancer, chronic back pain, CKD, COPD, gout, hepatitis, hyperlipidemia, HTN, hypomagnesium and chronic heart failure ? ?Echo report from 05/24/20 reviewed and showed an EF of 60-65% along with mild LVH and moderate LAE. Reviewed echo report on 10/29/15 which showed an EF of 55-65% with moderate MR.  ? ?Was in the ED 11/25/21 due to mechanical fall. Head CT was negative. IVF given for mild dehydration and she was released. Was in the ED 11/20/21 due to mechanical fall resulting in head laceration. Head CT negative. Laceration stapled and she was released.                               ? ?Patient presents today for a follow-up visit with a chief complaint of moderate shortness of breath with minimal exertion. Describes this as chronic in nature although feels like her breathing has improved some since getting IV lasix last week. She has associated fatigue, dry cough, wheezing, pedal edema (improving) and chronic pain along with this. She denies any difficulty sleeping, abdominal distention, palpitations, chest pain or weight gain.  ? ?Received 80mg  IV Lasix/ 57meq PO potassium last week and feels like her symptoms have improved some although she still has pedal edema. Currently undergoing radiation treatments for lung cancer.  ? ?Past Medical History:  ?Diagnosis Date  ? Acute postoperative pain 02/09/2018  ? Arrhythmia, sinus node 04/03/2014  ? Arthritis   ? Arthritis   ? Atrial fibrillation ([place])   ? Back pain   ? lower back chronic  ? Bradycardia   ? Breast cancer ([place]) 2004  ? right breast cancer  ? Cancer [facility]) 2004  ? rt breast cancer-post lumpectomy- chemo/rad  ? CHF (congestive heart failure) ([place])   ? Chronic back pain   ? CKD (chronic kidney disease)   ? COPD (chronic obstructive pulmonary disease) ([place])   ? wears O2 at 2L via [place] at night  ? COVID-19  06/11/2020  ? Cystocele   ? Decubitus ulcers   ? Dehydration   ? Gout   ? Hematuria   ? Hepatitis C   ? Hyperlipidemia   ? Hypertension   ? Hypomagnesemia 06/25/2015  ? NSTEMI (non-ST elevated myocardial infarction) ([place]) 04/08/2021  ? OAB (overactive bladder)   ? Overactive bladder   ? Overdose opiate, accidental or unintentional, sequela 10/02/2020  ? Restless leg   ? Shoulder pain, bilateral   ? Urinary frequency   ? UTI (lower urinary tract infection)   ? Vaginal atrophy   ? ?Past Surgical History:  ?Procedure Laterality Date  ? ABDOMINAL HYSTERECTOMY    ? BACK SURGERY    ? BREAST LUMPECTOMY Right 2004  ? BREAST SURGERY    ? rt lumpectomy  ? CARPAL TUNNEL RELEASE    ? CATARACT EXTRACTION W/PHACO  10/19/2011  ? Procedure: CATARACT EXTRACTION PHACO AND INTRAOCULAR LENS PLACEMENT (IOC);  Surgeon: Elta Guadeloupe T. Gershon Crane, MD;  Location: [facility];  Service: Ophthalmology;  Laterality: Right;  CDE:10.81  ? CATARACT EXTRACTION W/PHACO  11/02/2011  ? Procedure: CATARACT EXTRACTION PHACO AND INTRAOCULAR LENS PLACEMENT (IOC);  Surgeon: Elta Guadeloupe T. Gershon Crane, MD;  Location: [facility];  Service: Ophthalmology;  Laterality: Left;  CDE 8.60  ? CHOLECYSTECTOMY    ? 3/18  ? COLON  SURGERY    ? INSERT / REPLACE / REMOVE PACEMAKER    ? JOINT REPLACEMENT    ? bilateral TKA  ? LOWER EXTREMITY ANGIOGRAPHY Left 10/14/2020  ? Procedure: LOWER EXTREMITY ANGIOGRAPHY;  Surgeon: Katha Cabal, MD;  Location: [place] [facility];  Service: Cardiovascular;  Laterality: Left;  ? LOWER EXTREMITY ANGIOGRAPHY Right 09/08/2021  ? Procedure: LOWER EXTREMITY ANGIOGRAPHY;  Surgeon: Katha Cabal, MD;  Location: [place] [facility];  Service: Cardiovascular;  Laterality: Right;  ? PACEMAKER INSERTION N/A 12/24/2015  ? Procedure: INSERTION PACEMAKER;  Surgeon: Isaias Cowman, MD;  Location: [facility];  Service: Cardiovascular;  Laterality: N/A;  ? ROTATOR CUFF REPAIR    ? bilateral  ? VIDEO BRONCHOSCOPY WITH ENDOBRONCHIAL NAVIGATION Right 11/11/2021  ? Procedure:  VIDEO BRONCHOSCOPY WITH ENDOBRONCHIAL NAVIGATION;  Surgeon: Ottie Glazier, MD;  Location: [facility];  Service: Thoracic;  Laterality: Right;  ? ?Family History  ?Problem Relation Age of Onset  ? Kidney disease Brother   ? Heart disease Father   ? Heart disease Mother   ? Stroke Mother   ? Cancer Sister   ? Breast cancer Sister 47  ? Breast cancer Paternal Aunt   ? Anesthesia problems Neg Hx   ? Hypotension Neg Hx   ? Malignant hyperthermia Neg Hx   ? Pseudochol deficiency Neg Hx   ? ?Social History  ? ?Tobacco Use  ? Smoking status: Former  ?  Packs/day: 1.25  ?  Years: 40.00  ?  Pack years: 50.00  ?  Types: Cigarettes  ?  Quit date: 12/16/1998  ?  Years since quitting: 23.0  ? Smokeless tobacco: Never  ?Substance Use Topics  ? Alcohol use: No  ?  Alcohol/week: 0.0 standard drinks  ? ?Allergies  ?Allergen Reactions  ? Flexeril [Cyclobenzaprine Hcl]   ?  Severe Rash  ? Vancomycin Rash  ?  [place] Syndrome  ? ?Prior to Admission medications   ?Medication Sig Start Date End Date Taking? Authorizing Provider  ?albuterol (PROVENTIL) (2.5 MG/3ML) 0.083% nebulizer solution USE 1 VIAL IN NEBULIZER EVERY 8 HOURS AS NEEDED FOR WHEEZING OR SHORTNESS OF BREATH. 10/20/21  Yes Karamalegos, Devonne Doughty, DO  ?apixaban (ELIQUIS) 2.5 MG TABS tablet Take 1 tablet (2.5 mg total) by mouth 2 (two) times daily. 04/09/21  Yes Wieting, [name], MD  ?atorvastatin (LIPITOR) 20 MG tablet TAKE 1 TABLET BY MOUTH ONCE DAILY FOR CHOLESTEROL 02/17/21  Yes Karamalegos, Devonne Doughty, DO  ?bisacodyl (DULCOLAX) 5 MG EC tablet Take 2 tablets (10 mg total) by mouth daily as needed for moderate constipation. 07/04/20  Yes Karamalegos, Devonne Doughty, DO  ?BREZTRI AEROSPHERE 160-9-4.8 MCG/ACT AERO Inhale 2 puffs into the lungs 2 (two) times daily. 07/07/21  Yes Olin Hauser, DO  ?Cholecalciferol (VITAMIN D) 2000 units tablet Take 2,000 Units by mouth daily.    Yes [provider]  ?empagliflozin (JARDIANCE) 10 MG TABS tablet Take 1 tablet  (10 mg total) by mouth daily before breakfast. 12/14/21  Yes Alisa Graff, FNP  ?feeding supplement (ENSURE ENLIVE / ENSURE PLUS) LIQD Take 237 mLs by mouth 2 (two) times daily between meals. 11/13/21  Yes Annita Brod, MD  ?furosemide (LASIX) 40 MG tablet Take 1 tablet (40 mg total) by mouth daily. 09/23/21  Yes Nicole Kindred A, DO  ?gabapentin (NEURONTIN) 300 MG capsule Take 1 capsule (300 mg total) by mouth 3 (three) times daily. 11/12/21  Yes Annita Brod, MD  ?[facility] ASPIRIN LOW DOSE 81 MG  EC tablet TAKE 1 TABLET BY MOUTH ONCE DAILY. *SWALLOW WHOLE* 08/18/21  Yes Schnier, Dolores Lory, MD  ?Melatonin 10 MG TABS Take 10 mg by mouth at bedtime.   Yes [provider]  ?Multiple Vitamin (MULTIVITAMIN WITH MINERALS) TABS tablet Take 1 tablet by mouth daily. 11/13/21  Yes Annita Brod, MD  ?oxyCODONE (OXY IR/ROXICODONE) 5 MG immediate release tablet Take 2 tablets (10 mg total) by mouth 2 (two) times daily. May also take 1 tablet (5 mg total) at bedtime as needed for severe pain. Must last 30 days.. 11/23/21 12/23/21 Yes Milinda Pointer, MD  ?OXYGEN Inhale 2 L into the lungs daily.   Yes [provider]  ?rOPINIRole (REQUIP) 2 MG tablet TAKE 2 TABLETS BY MOUTH AT BEDTIME 10/21/21  Yes Karamalegos, Devonne Doughty, DO  ?acetaminophen (TYLENOL) 500 MG tablet Take 500 mg by mouth in the morning and at bedtime. ?Patient not taking: Reported on 12/14/2021    [provider]  ?diclofenac Sodium (VOLTAREN) 1 % GEL Apply 4 g topically 2 (two) times daily as needed (As needed for pain). ?Patient not taking: Reported on 12/14/2021 10/15/21   Kris Hartmann, NP  ?levocetirizine (XYZAL) 5 MG tablet TAKE (1/2) TABLET BY MOUTH EVERY OTHER DAY. ?Patient not taking: Reported on 12/10/2021 06/19/21   Olin Hauser, DO  ? ? ?Review of Systems  ?Constitutional:  Positive for fatigue. Negative for appetite change.  ?HENT:  Negative for congestion, postnasal drip and sore throat.   ?Eyes: Negative.    ?Respiratory:  Positive for cough (in the mornings), shortness of breath (improving) and wheezing. Negative for chest tightness.   ?Cardiovascular:  Positive for leg swelling (little bit better). [place]

## 2021-12-13 NOTE — Progress Notes (Signed)
PROVIDER NOTE: Information contained herein reflects review and annotations entered in association with encounter. Interpretation of such information and data should be left to medically-trained personnel. Information provided to patient can be located elsewhere in the medical record under "Patient Instructions". Document created using STT-dictation technology, any transcriptional errors that may result from process are unintentional.  ?  ?Patient: Kristin [name]  Service Category: E/M  Provider: Gaspar Cola, MD  ?DOB: 1930/04/26  DOS: 12/16/2021  Specialty: Interventional Pain Management  ?MRN: [id number]  Setting: Ambulatory outpatient  PCP: Olin Hauser, DO  ?Type: Established Patient    Referring Provider: [name] *  ?Location: Office  Delivery: Face-to-face    ? ?HPI  ?Kristin [name], a 88 y.o. year old female, is here today because of her Chronic pain syndrome [G89.4]. Kristin [name] primary complain today is Back Pain (low) and Leg Pain (bilateral) ?Last encounter: My last encounter with her was on 11/18/2021. 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); Epidural fibrosis; Lumbar foraminal stenosis (Severe Left L4-5); Chronic sacroiliac joint pain (Left); Osteoarthritis involving multiple joints; Chronic neck pain; Chronic hip pain (Bilateral); Chronic cervical radicular pain (3ry area of Pain) (Bilateral)  (L>R); Chronic lower extremity pain (2ry area of Pain) (Bilateral) (L>R); DDD (degenerative disc disease), cervical; Numbness and tingling of upper extremity (Bilateral); Chronic shoulder pain, Radicular (Bilateral) (L>R); Cervical facet syndrome (Bilateral) (L>R); Cervical (3-4 mm) Anterolisthesis of C4 on C5; Cervical foraminal stenosis (Bilateral); Spondylosis without myelopathy or radiculopathy, lumbosacral region; Chronic shoulders pain (Bilateral) (L>R); Osteoarthritis of shoulder (Bilateral); Osteoarthritis of hip (Bilateral); Chronic shoulder pain after shoulder replacement (Bilateral) (L>R); Trigger finger, left middle finger; Cervicalgia; Greater trochanteric bursitis of hip (Left); Other specified dorsopathies, sacral and sacrococcygeal region; Chronic hip pain (Left); Osteoarthritis of hip (Left); Abnormal MRI, lumbar spine (11/03/2019); Chronic sacroiliac pain (Bilateral); Osteoarthritis of sacroiliac joints (Bilateral) ([place]); Chronic midline low back pain without sciatica; Atherosclerosis of native arteries of the extremities with ulceration ([place]); Ischemic leg pain (Bilateral); and Small cell lung cancer ([place]) on their pertinent problem list. ?Pain Assessment: Severity of   is reported as a 5 /10. Location: Back Lower/legs. Onset: More than a month ago. Quality: Aching, Constant. Timing: Constant. Modifying factor(s): rest, meds, heat, topicals. ?Vitals:  height is 5\' 7"  (1.702 m) and weight is 158 lb (71.7 kg). Her temporal temperature is 97.3 ?F (36.3 ?C) (abnormal). Her blood pressure is 109/65 and her pulse is 77. Her respiration is 18.  ? ?Reason for encounter: medication management.   The patient indicates doing well with the current medication regimen. No adverse reactions or side effects reported to the medications.  The patient recently started her chemotherapy and radiation therapy for her lung cancer.  She also had an event where she fell in the bathroom and had to receive some stitches in  her head.  However, a CT of the head done in the ED was negative for any major injuries. ? ?RTCB: 03/23/2022 ?Nonopioids transferred 06/04/2020: Requip, MiraLAX, Dulcolax, and melatonin ? ?Pharmacotherapy Assessment  ?Analgesic: Oxycodone IR  10 mg, 1 tab PO BID (20 mg/day of oxycodone) ?MME/day: 30 mg/day.  ? ?Monitoring: ?[place] PMP: PDMP reviewed during this encounter.       ?Pharmacotherapy: No side-effects or adverse reactions reported. ?Compliance: No problems identified. ?Effectiveness: Clinically acceptable. ? ?Hart Rochester RN  12/16/2021  2:41 PM  Signed ?Nursing Pain Medication Assessment:  ?Safety precautions to be maintained throughout the outpatient stay will include: orient to surroundings, keep bed in low position, maintain call bell within reach at all times, provide assistance with transfer out of bed and ambulation.  ?Medication Inspection Compliance: Pill count conducted under aseptic conditions, in front of the patient. Neither the pills nor the bottle was removed from the patient's sight at any time. Once count was completed pills were immediately returned to the patient in their original bottle. ? ?Medication: Oxycodone IR ?Pill/Patch Count:  114 of 150 pills remain ?Pill/Patch Appearance: Markings consistent with prescribed medication ?Bottle Appearance: Standard pharmacy container. Clearly labeled. ?Filled Date: 04 / 17 / 2023 ?Last Medication intake:  Today ?   UDS:  ?Summary  ?Date Value Ref Range Status  ?03/04/2021 Note  Final  ?  Comment:  ?  ==================================================================== ?[employer] Select 13 (MW) ?==================================================================== ?Test                             Result       Flag       Units ? ?Drug Present and Declared for Prescription Verification ?  Oxymorphone                    270          EXPECTED   ng/mg creat ?  Noroxycodone                   317          EXPECTED   ng/mg creat ?   Oxymorphone and  noroxycodone are expected metabolites of oxycodone. ?   Sources of oxycodone are scheduled prescription medications. ?   Oxymorphone is also available as a scheduled prescription medication. ? ?Drug Absent but Declared for Prescription Verification ?  Oxycodone                      Not Detected UNEXPECTED ng/mg creat ?   Oxycodone is almost always present in patients taking this drug ?   consistently.  Absence of oxycodone could be due to lapse of time ?   since the last dose or unusual pharmacokinetics (rapid metabolism). ? ?==================================================================== ?Test                      Result    Flag   Units      Ref Range ?  Creatinine              30               mg/dL      >=20 ?==================================================================== ?Declared Medications: ? The flagging and interpretation on this report are based on the ? following declared medications.  Unexpected results may arise from ? inaccuracies in the declared medications. ? ? **Note: The testing scope of this panel includes these medications: ? ? Oxycodone ? ? **Note: The testing scope of this panel does not include the ? following reported medications: ? ? Acetaminophen ? Albuterol ? Apixaban (Eliquis) ? Aspirin ? Atorvastatin ? Bisacodyl ?  Dextromethorphan (Mucinex DM) ? Furosemide ? Guaifenesin (Mucinex DM) ? Helium ? Levocetirizine (Xyzal) ? Lisinopril ? Melatonin ? Oxygen ? Ropinirole ? Solifenacin (Vesicare) ? Supplement (Fiber) ? Tiotropium ? Topical Lidocaine ? Umeclidinium (Anoro) ? Vilanterol (Anoro) ?==================================================================== ?For clinical consultation, please call [phone]. ?==================================================================== ?  ?  ? ?ROS  ?Constitutional: Denies any fever or chills ?Gastrointestinal: No reported hemesis, hematochezia, vomiting, or acute GI distress ?Musculoskeletal: Denies any acute onset joint swelling,  redness, loss of ROM, or weakness ?Neurological: No reported episodes of acute onset apraxia, aphasia, dysarthria, agnosia, amnesia, paralysis, loss of coordination, or loss of consciousness ? ?Medication Review  ?

## 2021-12-14 ENCOUNTER — Ambulatory Visit: Payer: Medicare HMO | Attending: Family | Admitting: Family

## 2021-12-14 ENCOUNTER — Encounter: Payer: Self-pay | Admitting: Family

## 2021-12-14 ENCOUNTER — Ambulatory Visit
Admission: RE | Admit: 2021-12-14 | Discharge: 2021-12-14 | Disposition: A | Discharge status: Home / Self Care | Payer: Medicare HMO | Source: Ambulatory Visit | Attending: Radiation Oncology | Admitting: Radiation Oncology

## 2021-12-14 ENCOUNTER — Other Ambulatory Visit: Payer: Self-pay

## 2021-12-14 ENCOUNTER — Encounter: Payer: Self-pay | Admitting: *Deleted

## 2021-12-14 VITALS — BP 113/55 | HR 127 | Resp 16 | Ht 67.0 in | Wt 161.0 lb

## 2021-12-14 DIAGNOSIS — C3490 Malignant neoplasm of unspecified part of unspecified bronchus or lung: Secondary | ICD-10-CM | POA: Insufficient documentation

## 2021-12-14 DIAGNOSIS — Z87891 Personal history of nicotine dependence: Secondary | ICD-10-CM | POA: Diagnosis not present

## 2021-12-14 DIAGNOSIS — I5032 Chronic diastolic (congestive) heart failure: Secondary | ICD-10-CM | POA: Diagnosis not present

## 2021-12-14 DIAGNOSIS — Z853 Personal history of malignant neoplasm of breast: Secondary | ICD-10-CM | POA: Diagnosis not present

## 2021-12-14 DIAGNOSIS — M109 Gout, unspecified: Secondary | ICD-10-CM | POA: Insufficient documentation

## 2021-12-14 DIAGNOSIS — C771 Secondary and unspecified malignant neoplasm of intrathoracic lymph nodes: Secondary | ICD-10-CM | POA: Diagnosis not present

## 2021-12-14 DIAGNOSIS — E785 Hyperlipidemia, unspecified: Secondary | ICD-10-CM | POA: Insufficient documentation

## 2021-12-14 DIAGNOSIS — G8929 Other chronic pain: Secondary | ICD-10-CM | POA: Insufficient documentation

## 2021-12-14 DIAGNOSIS — I130 Hypertensive heart and chronic kidney disease with heart failure and stage 1 through stage 4 chronic kidney disease, or unspecified chronic kidney disease: Secondary | ICD-10-CM | POA: Diagnosis not present

## 2021-12-14 DIAGNOSIS — I480 Paroxysmal atrial fibrillation: Secondary | ICD-10-CM | POA: Diagnosis not present

## 2021-12-14 DIAGNOSIS — J449 Chronic obstructive pulmonary disease, unspecified: Secondary | ICD-10-CM | POA: Insufficient documentation

## 2021-12-14 DIAGNOSIS — K759 Inflammatory liver disease, unspecified: Secondary | ICD-10-CM | POA: Diagnosis not present

## 2021-12-14 DIAGNOSIS — Z510 Encounter for antineoplastic radiation therapy: Secondary | ICD-10-CM | POA: Diagnosis not present

## 2021-12-14 DIAGNOSIS — R000 Tachycardia, unspecified: Secondary | ICD-10-CM | POA: Diagnosis not present

## 2021-12-14 DIAGNOSIS — N183 Chronic kidney disease, stage 3 unspecified: Secondary | ICD-10-CM

## 2021-12-14 DIAGNOSIS — I129 Hypertensive chronic kidney disease with stage 1 through stage 4 chronic kidney disease, or unspecified chronic kidney disease: Secondary | ICD-10-CM

## 2021-12-14 DIAGNOSIS — I739 Peripheral vascular disease, unspecified: Secondary | ICD-10-CM | POA: Diagnosis not present

## 2021-12-14 DIAGNOSIS — N189 Chronic kidney disease, unspecified: Secondary | ICD-10-CM | POA: Insufficient documentation

## 2021-12-14 DIAGNOSIS — C3411 Malignant neoplasm of upper lobe, right bronchus or lung: Secondary | ICD-10-CM

## 2021-12-14 LAB — BASIC METABOLIC PANEL
Anion gap: 11 (ref 5–15)
BUN: 33 mg/dL — ABNORMAL HIGH (ref 8–23)
CO2: 25 mmol/L (ref 22–32)
Calcium: 9.7 mg/dL (ref 8.9–10.3)
Chloride: 105 mmol/L (ref 98–111)
Creatinine, Ser: 1.16 mg/dL — ABNORMAL HIGH (ref 0.44–1.00)
GFR, Estimated: 45 mL/min — ABNORMAL LOW (ref >60)
Glucose, Bld: 73 mg/dL (ref 70–99)
Potassium: 3.9 mmol/L (ref 3.5–5.1)
Sodium: 141 mmol/L (ref 135–145)

## 2021-12-14 LAB — RAD ONC ARIA SESSION SUMMARY
Course Elapsed Days: 6
Plan Fractions Treated to Date: 5
Plan Prescribed Dose Per Fraction: 6 Gy
Plan Total Fractions Prescribed: 10
Plan Total Prescribed Dose: 60 Gy
Reference Point Dosage Given to Date: 30 Gy
Reference Point Session Dosage Given: 6 Gy
Session Number: 5

## 2021-12-14 MED ORDER — EMPAGLIFLOZIN 10 MG PO TABS: 10.0000 mg | ORAL_TABLET | Freq: Every day | ORAL | 0 refills | Status: DC

## 2021-12-14 NOTE — Patient Instructions (Addendum)
Continue weighing daily and call for an overnight weight gain of 3 pounds or more or a weekly weight gain of more than 5 pounds. ? ? ?If you have voicemail, please make sure your mailbox is cleaned out so that we may leave a message and please make sure to listen to any voicemails.  ? ? ?Begin jardiance as 1 tablet every morning ? ? ?

## 2021-12-15 ENCOUNTER — Other Ambulatory Visit: Payer: Self-pay

## 2021-12-15 ENCOUNTER — Ambulatory Visit: Payer: Self-pay | Admitting: Licensed Clinical Social Worker

## 2021-12-15 ENCOUNTER — Ambulatory Visit: Payer: Medicare HMO

## 2021-12-15 ENCOUNTER — Ambulatory Visit
Admission: RE | Admit: 2021-12-15 | Discharge: 2021-12-15 | Disposition: A | Discharge status: Home / Self Care | Payer: Medicare HMO | Source: Ambulatory Visit | Attending: Radiation Oncology | Admitting: Radiation Oncology

## 2021-12-15 DIAGNOSIS — C3411 Malignant neoplasm of upper lobe, right bronchus or lung: Secondary | ICD-10-CM | POA: Diagnosis not present

## 2021-12-15 DIAGNOSIS — I739 Peripheral vascular disease, unspecified: Secondary | ICD-10-CM

## 2021-12-15 DIAGNOSIS — Z510 Encounter for antineoplastic radiation therapy: Secondary | ICD-10-CM | POA: Diagnosis not present

## 2021-12-15 DIAGNOSIS — I5032 Chronic diastolic (congestive) heart failure: Secondary | ICD-10-CM

## 2021-12-15 DIAGNOSIS — I4891 Unspecified atrial fibrillation: Secondary | ICD-10-CM

## 2021-12-15 DIAGNOSIS — N1831 Chronic kidney disease, stage 3a: Secondary | ICD-10-CM

## 2021-12-15 DIAGNOSIS — G8929 Other chronic pain: Secondary | ICD-10-CM

## 2021-12-15 DIAGNOSIS — N183 Chronic kidney disease, stage 3 unspecified: Secondary | ICD-10-CM

## 2021-12-15 DIAGNOSIS — J441 Chronic obstructive pulmonary disease with (acute) exacerbation: Secondary | ICD-10-CM

## 2021-12-15 DIAGNOSIS — Z87891 Personal history of nicotine dependence: Secondary | ICD-10-CM | POA: Diagnosis not present

## 2021-12-15 DIAGNOSIS — C771 Secondary and unspecified malignant neoplasm of intrathoracic lymph nodes: Secondary | ICD-10-CM | POA: Diagnosis not present

## 2021-12-15 LAB — RAD ONC ARIA SESSION SUMMARY
Course Elapsed Days: 7
Plan Fractions Treated to Date: 6
Plan Prescribed Dose Per Fraction: 6 Gy
Plan Total Fractions Prescribed: 10
Plan Total Prescribed Dose: 60 Gy
Reference Point Dosage Given to Date: 36 Gy
Reference Point Session Dosage Given: 6 Gy
Session Number: 6

## 2021-12-16 ENCOUNTER — Other Ambulatory Visit: Payer: Self-pay

## 2021-12-16 ENCOUNTER — Ambulatory Visit (HOSPITAL_BASED_OUTPATIENT_CLINIC_OR_DEPARTMENT_OTHER): Payer: Medicare HMO | Admitting: Pain Medicine

## 2021-12-16 ENCOUNTER — Ambulatory Visit
Admission: RE | Admit: 2021-12-16 | Discharge: 2021-12-16 | Disposition: A | Discharge status: Home / Self Care | Payer: Medicare HMO | Source: Ambulatory Visit | Attending: Radiation Oncology | Admitting: Radiation Oncology

## 2021-12-16 ENCOUNTER — Encounter: Payer: Self-pay | Admitting: Pain Medicine

## 2021-12-16 VITALS — BP 109/65 | HR 77 | Temp 97.3°F | Resp 18 | Ht 67.0 in | Wt 158.0 lb

## 2021-12-16 DIAGNOSIS — Z510 Encounter for antineoplastic radiation therapy: Secondary | ICD-10-CM | POA: Diagnosis not present

## 2021-12-16 DIAGNOSIS — Z853 Personal history of malignant neoplasm of breast: Secondary | ICD-10-CM | POA: Diagnosis not present

## 2021-12-16 DIAGNOSIS — E785 Hyperlipidemia, unspecified: Secondary | ICD-10-CM | POA: Diagnosis present

## 2021-12-16 DIAGNOSIS — J9621 Acute and chronic respiratory failure with hypoxia: Secondary | ICD-10-CM | POA: Diagnosis not present

## 2021-12-16 DIAGNOSIS — R9431 Abnormal electrocardiogram [ECG] [EKG]: Secondary | ICD-10-CM | POA: Diagnosis not present

## 2021-12-16 DIAGNOSIS — C771 Secondary and unspecified malignant neoplasm of intrathoracic lymph nodes: Secondary | ICD-10-CM | POA: Diagnosis not present

## 2021-12-16 DIAGNOSIS — Z79899 Other long term (current) drug therapy: Secondary | ICD-10-CM | POA: Insufficient documentation

## 2021-12-16 DIAGNOSIS — J189 Pneumonia, unspecified organism: Secondary | ICD-10-CM | POA: Diagnosis not present

## 2021-12-16 DIAGNOSIS — M5442 Lumbago with sciatica, left side: Secondary | ICD-10-CM | POA: Insufficient documentation

## 2021-12-16 DIAGNOSIS — M5441 Lumbago with sciatica, right side: Secondary | ICD-10-CM | POA: Insufficient documentation

## 2021-12-16 DIAGNOSIS — R0602 Shortness of breath: Secondary | ICD-10-CM | POA: Diagnosis not present

## 2021-12-16 DIAGNOSIS — I7121 Aneurysm of the ascending aorta, without rupture: Secondary | ICD-10-CM | POA: Diagnosis present

## 2021-12-16 DIAGNOSIS — G894 Chronic pain syndrome: Secondary | ICD-10-CM

## 2021-12-16 DIAGNOSIS — Z7901 Long term (current) use of anticoagulants: Secondary | ICD-10-CM | POA: Diagnosis not present

## 2021-12-16 DIAGNOSIS — M47812 Spondylosis without myelopathy or radiculopathy, cervical region: Secondary | ICD-10-CM | POA: Insufficient documentation

## 2021-12-16 DIAGNOSIS — I4510 Unspecified right bundle-branch block: Secondary | ICD-10-CM | POA: Diagnosis present

## 2021-12-16 DIAGNOSIS — G8929 Other chronic pain: Secondary | ICD-10-CM | POA: Insufficient documentation

## 2021-12-16 DIAGNOSIS — M109 Gout, unspecified: Secondary | ICD-10-CM | POA: Diagnosis present

## 2021-12-16 DIAGNOSIS — C3411 Malignant neoplasm of upper lobe, right bronchus or lung: Secondary | ICD-10-CM | POA: Diagnosis not present

## 2021-12-16 DIAGNOSIS — R509 Fever, unspecified: Secondary | ICD-10-CM | POA: Diagnosis not present

## 2021-12-16 DIAGNOSIS — R069 Unspecified abnormalities of breathing: Secondary | ICD-10-CM | POA: Diagnosis not present

## 2021-12-16 DIAGNOSIS — M961 Postlaminectomy syndrome, not elsewhere classified: Secondary | ICD-10-CM

## 2021-12-16 DIAGNOSIS — M79604 Pain in right leg: Secondary | ICD-10-CM | POA: Insufficient documentation

## 2021-12-16 DIAGNOSIS — R531 Weakness: Secondary | ICD-10-CM | POA: Diagnosis not present

## 2021-12-16 DIAGNOSIS — R0600 Dyspnea, unspecified: Secondary | ICD-10-CM | POA: Diagnosis not present

## 2021-12-16 DIAGNOSIS — J9601 Acute respiratory failure with hypoxia: Secondary | ICD-10-CM | POA: Diagnosis not present

## 2021-12-16 DIAGNOSIS — M25511 Pain in right shoulder: Secondary | ICD-10-CM

## 2021-12-16 DIAGNOSIS — I495 Sick sinus syndrome: Secondary | ICD-10-CM | POA: Diagnosis not present

## 2021-12-16 DIAGNOSIS — M542 Cervicalgia: Secondary | ICD-10-CM | POA: Insufficient documentation

## 2021-12-16 DIAGNOSIS — C3490 Malignant neoplasm of unspecified part of unspecified bronchus or lung: Secondary | ICD-10-CM | POA: Diagnosis not present

## 2021-12-16 DIAGNOSIS — I248 Other forms of acute ischemic heart disease: Secondary | ICD-10-CM | POA: Diagnosis not present

## 2021-12-16 DIAGNOSIS — N1832 Chronic kidney disease, stage 3b: Secondary | ICD-10-CM | POA: Diagnosis present

## 2021-12-16 DIAGNOSIS — D696 Thrombocytopenia, unspecified: Secondary | ICD-10-CM | POA: Diagnosis not present

## 2021-12-16 DIAGNOSIS — Z79891 Long term (current) use of opiate analgesic: Secondary | ICD-10-CM

## 2021-12-16 DIAGNOSIS — R4182 Altered mental status, unspecified: Secondary | ICD-10-CM | POA: Diagnosis not present

## 2021-12-16 DIAGNOSIS — Z20822 Contact with and (suspected) exposure to covid-19: Secondary | ICD-10-CM | POA: Diagnosis not present

## 2021-12-16 DIAGNOSIS — I130 Hypertensive heart and chronic kidney disease with heart failure and stage 1 through stage 4 chronic kidney disease, or unspecified chronic kidney disease: Secondary | ICD-10-CM | POA: Diagnosis not present

## 2021-12-16 DIAGNOSIS — M533 Sacrococcygeal disorders, not elsewhere classified: Secondary | ICD-10-CM | POA: Insufficient documentation

## 2021-12-16 DIAGNOSIS — D631 Anemia in chronic kidney disease: Secondary | ICD-10-CM | POA: Diagnosis present

## 2021-12-16 DIAGNOSIS — M25512 Pain in left shoulder: Secondary | ICD-10-CM

## 2021-12-16 DIAGNOSIS — M25559 Pain in unspecified hip: Secondary | ICD-10-CM | POA: Insufficient documentation

## 2021-12-16 DIAGNOSIS — M5412 Radiculopathy, cervical region: Secondary | ICD-10-CM

## 2021-12-16 DIAGNOSIS — S91002A Unspecified open wound, left ankle, initial encounter: Secondary | ICD-10-CM | POA: Diagnosis not present

## 2021-12-16 DIAGNOSIS — Z515 Encounter for palliative care: Secondary | ICD-10-CM | POA: Diagnosis not present

## 2021-12-16 DIAGNOSIS — I5033 Acute on chronic diastolic (congestive) heart failure: Secondary | ICD-10-CM | POA: Diagnosis not present

## 2021-12-16 DIAGNOSIS — G2581 Restless legs syndrome: Secondary | ICD-10-CM | POA: Diagnosis present

## 2021-12-16 DIAGNOSIS — R0902 Hypoxemia: Secondary | ICD-10-CM | POA: Diagnosis not present

## 2021-12-16 DIAGNOSIS — M47816 Spondylosis without myelopathy or radiculopathy, lumbar region: Secondary | ICD-10-CM | POA: Insufficient documentation

## 2021-12-16 DIAGNOSIS — J9690 Respiratory failure, unspecified, unspecified whether with hypoxia or hypercapnia: Secondary | ICD-10-CM | POA: Diagnosis not present

## 2021-12-16 DIAGNOSIS — I480 Paroxysmal atrial fibrillation: Secondary | ICD-10-CM | POA: Diagnosis not present

## 2021-12-16 DIAGNOSIS — I959 Hypotension, unspecified: Secondary | ICD-10-CM | POA: Diagnosis not present

## 2021-12-16 DIAGNOSIS — A419 Sepsis, unspecified organism: Secondary | ICD-10-CM | POA: Diagnosis not present

## 2021-12-16 DIAGNOSIS — M79605 Pain in left leg: Secondary | ICD-10-CM | POA: Insufficient documentation

## 2021-12-16 DIAGNOSIS — N3281 Overactive bladder: Secondary | ICD-10-CM | POA: Diagnosis present

## 2021-12-16 DIAGNOSIS — I252 Old myocardial infarction: Secondary | ICD-10-CM | POA: Diagnosis not present

## 2021-12-16 DIAGNOSIS — R400 Somnolence: Secondary | ICD-10-CM | POA: Diagnosis not present

## 2021-12-16 DIAGNOSIS — L03116 Cellulitis of left lower limb: Secondary | ICD-10-CM | POA: Diagnosis not present

## 2021-12-16 DIAGNOSIS — Z87891 Personal history of nicotine dependence: Secondary | ICD-10-CM | POA: Diagnosis not present

## 2021-12-16 DIAGNOSIS — M7989 Other specified soft tissue disorders: Secondary | ICD-10-CM | POA: Diagnosis not present

## 2021-12-16 DIAGNOSIS — I509 Heart failure, unspecified: Secondary | ICD-10-CM | POA: Diagnosis not present

## 2021-12-16 DIAGNOSIS — I4891 Unspecified atrial fibrillation: Secondary | ICD-10-CM | POA: Diagnosis not present

## 2021-12-16 DIAGNOSIS — Z8616 Personal history of COVID-19: Secondary | ICD-10-CM | POA: Diagnosis not present

## 2021-12-16 LAB — RAD ONC ARIA SESSION SUMMARY
Course Elapsed Days: 8
Plan Fractions Treated to Date: 7
Plan Prescribed Dose Per Fraction: 6 Gy
Plan Total Fractions Prescribed: 10
Plan Total Prescribed Dose: 60 Gy
Reference Point Dosage Given to Date: 42 Gy
Reference Point Session Dosage Given: 6 Gy
Session Number: 7

## 2021-12-16 MED ORDER — OXYCODONE HCL 5 MG PO TABS: ORAL_TABLET | ORAL | 0 refills | Status: DC

## 2021-12-16 NOTE — Progress Notes (Signed)
Nursing Pain Medication Assessment:  ?Safety precautions to be maintained throughout the outpatient stay will include: orient to surroundings, keep bed in low position, maintain call bell within reach at all times, provide assistance with transfer out of bed and ambulation.  ?Medication Inspection Compliance: Pill count conducted under aseptic conditions, in front of the patient. Neither the pills nor the bottle was removed from the patient's sight at any time. Once count was completed pills were immediately returned to the patient in their original bottle. ? ?Medication: Oxycodone IR ?Pill/Patch Count:  114 of 150 pills remain ?Pill/Patch Appearance: Markings consistent with prescribed medication ?Bottle Appearance: Standard pharmacy container. Clearly labeled. ?Filled Date: 04 / 17 / 2023 ?Last Medication intake:  Today ?

## 2021-12-16 NOTE — Chronic Care Management (AMB) (Signed)
?Chronic Care Management  ? ? Clinical Social Work Note ? ?12/16/2021 ?Name: Kristin [name] MRN: [id number] DOB: 23-Jul-1930 ? ?Kristin [name] is a 88 y.o. year old female who is a primary care patient of Olin Hauser, DO. The CCM team was consulted to assist the patient with chronic disease management and/or care coordination needs related to: Level of Care Concerns and Caregiver Stress.  ? ?Engaged with patient's granddaughter by telephone for initial visit in response to provider referral for social work chronic care management and care coordination services.  ? ?Consent to Services:  ?The patient was given the following information about Chronic Care Management services today, agreed to services, and gave verbal consent: 1. CCM service includes personalized support from designated clinical staff supervised by the primary care provider, including individualized plan of care and coordination with other care providers 2. 24/7 contact phone numbers for assistance for urgent and routine care needs. 3. Service will only be billed when office clinical staff spend 20 minutes or more in a month to coordinate care. 4. Only one practitioner may furnish and bill the service in a calendar month. 5.The patient may stop CCM services at any time (effective at the end of the month) by phone call to the office staff. 6. The patient will be responsible for cost sharing (co-pay) of up to 20% of the service fee (after annual deductible is met). Patient agreed to services and consent obtained. ? ?Patient agreed to services and consent obtained.  ? ?Assessment: Review of patient past medical history, allergies, medications, and health status, including review of relevant consultants reports was performed today as part of a comprehensive evaluation and provision of chronic care management and care coordination services.    ? ?SDOH (Social Determinants of Health) assessments and interventions performed:   ? ?Advanced Directives  Status: Not addressed in this encounter. ? ?CCM Care Plan ? ?Allergies  ?Allergen Reactions  ? Flexeril [Cyclobenzaprine Hcl]   ?  Severe Rash  ? Vancomycin Rash  ?  [place] Syndrome  ? ? ?Outpatient Encounter Medications as of 12/15/2021  ?Medication Sig  ? acetaminophen (TYLENOL) 500 MG tablet Take 500 mg by mouth in the morning and at bedtime.  ? albuterol (PROVENTIL) (2.5 MG/3ML) 0.083% nebulizer solution USE 1 VIAL IN NEBULIZER EVERY 8 HOURS AS NEEDED FOR WHEEZING OR SHORTNESS OF BREATH.  ? apixaban (ELIQUIS) 2.5 MG TABS tablet Take 1 tablet (2.5 mg total) by mouth 2 (two) times daily.  ? atorvastatin (LIPITOR) 20 MG tablet TAKE 1 TABLET BY MOUTH ONCE DAILY FOR CHOLESTEROL  ? bisacodyl (DULCOLAX) 5 MG EC tablet Take 2 tablets (10 mg total) by mouth daily as needed for moderate constipation.  ? BREZTRI AEROSPHERE 160-9-4.8 MCG/ACT AERO Inhale 2 puffs into the lungs 2 (two) times daily.  ? Cholecalciferol (VITAMIN D) 2000 units tablet Take 2,000 Units by mouth daily.   ? diclofenac Sodium (VOLTAREN) 1 % GEL Apply 4 g topically 2 (two) times daily as needed (As needed for pain).  ? empagliflozin (JARDIANCE) 10 MG TABS tablet Take 1 tablet (10 mg total) by mouth daily before breakfast.  ? feeding supplement (ENSURE ENLIVE / ENSURE PLUS) LIQD Take 237 mLs by mouth 2 (two) times daily between meals.  ? furosemide (LASIX) 40 MG tablet Take 1 tablet (40 mg total) by mouth daily.  ? gabapentin (NEURONTIN) 300 MG capsule Take 1 capsule (300 mg total) by mouth 3 (three) times daily.  ? GNP ASPIRIN LOW DOSE 81 MG  EC tablet TAKE 1 TABLET BY MOUTH ONCE DAILY. *SWALLOW WHOLE*  ? levocetirizine (XYZAL) 5 MG tablet TAKE (1/2) TABLET BY MOUTH EVERY OTHER DAY.  ? Melatonin 10 MG TABS Take 10 mg by mouth at bedtime.  ? Multiple Vitamin (MULTIVITAMIN WITH MINERALS) TABS tablet Take 1 tablet by mouth daily.  ? OXYGEN Inhale 2 L into the lungs daily.  ? rOPINIRole (REQUIP) 2 MG tablet TAKE 2 TABLETS BY MOUTH AT BEDTIME  ?  [DISCONTINUED] oxyCODONE (OXY IR/ROXICODONE) 5 MG immediate release tablet Take 2 tablets (10 mg total) by mouth 2 (two) times daily. May also take 1 tablet (5 mg total) at bedtime as needed for severe pain. Must last 30 days..  ? ?No facility-administered encounter medications on file as of 12/15/2021.  ? ? ?Patient Active Problem List  ? Diagnosis Date Noted  ? Malignant neoplasm of right upper lobe of lung ([place]) 12/02/2021  ? Small cell lung cancer ([place]) 11/17/2021  ? Goals of care, counseling/discussion 11/17/2021  ? Overweight (BMI 25.0-29.9) 11/11/2021  ? Bacteremia 11/06/2021  ? CAP (community acquired pneumonia) 11/05/2021  ? Postobstructive pneumonia 11/05/2021  ? Mass of upper lobe of right lung 11/05/2021  ? Sepsis due to Escherichia coli (E. coli) ([place]) 11/05/2021  ? Dyslipidemia 11/05/2021  ? COPD with acute exacerbation ([place]) 09/20/2021  ? Acute on chronic diastolic CHF (congestive heart failure) ([place]) 09/19/2021  ? Lymphedema 09/17/2021  ? Ischemic leg pain (Bilateral) 08/19/2021  ? Paroxysmal atrial fibrillation (H[facility])   ? History of atrial fibrillation 04/08/2021  ? PVD (peripheral vascular disease) ([place]) 04/08/2021  ? Low blood pressure reading 04/07/2021  ? Physical tolerance to opiate drug 01/28/2021  ? Chronic use of opiate for therapeutic purpose 11/24/2020  ? Atherosclerosis of native arteries of the extremities with ulceration ([place]) 11/03/2020  ? Uncomplicated opioid dependence ([place]) 10/20/2020  ? Encounter for chronic pain management 10/02/2020  ? Encounter for pain management counseling 10/02/2020  ? Chronic kidney disease 10/02/2020  ? Chronic sacroiliac pain (Bilateral) 09/30/2020  ? Osteoarthritis of sacroiliac joints (Bilateral) ([place]) 09/30/2020  ? Chronic midline low back pain without sciatica 09/30/2020  ? Acute respiratory failure with hypoxia ([place]) 08/02/2020  ? Prolonged QT interval 08/02/2020  ? Venous insufficiency of both lower extremities 07/04/2020  ? Varicose veins of left  lower extremity with inflammation, with ulcer of ankle limited to breakdown of skin ([place]) 04/01/2020  ? Opioid dependence with opioid-induced disorder ([place]) 02/14/2020  ? Abnormal MRI, lumbar spine (11/03/2019) 01/23/2020  ? Pharmacologic therapy 12/20/2019  ? Other specified dorsopathies, sacral and sacrococcygeal region 06/12/2019  ? Chronic hip pain (Left) 06/12/2019  ? Osteoarthritis of hip (Left) 06/12/2019  ? Greater trochanteric bursitis of hip (Left) 03/12/2019  ? Preop testing 01/22/2019  ? Cervicalgia 10/03/2018  ? Chronic shoulders pain (Bilateral) (L>R) 05/29/2018  ? Osteoarthritis of shoulder (Bilateral) 05/29/2018  ? Osteoarthritis of hip (Bilateral) 05/29/2018  ? Chronic shoulder pain after shoulder replacement (Bilateral) (L>R) 05/29/2018  ? Trigger finger, left middle finger 05/29/2018  ? Spondylosis without myelopathy or radiculopathy, lumbosacral region 02/09/2018  ? Insomnia 02/01/2018  ? Numbness and tingling of upper extremity (Bilateral) 09/07/2017  ? Chronic shoulder pain, Radicular (Bilateral) (L>R) 09/07/2017  ? Cervical facet syndrome (Bilateral) (L>R) 09/07/2017  ? Cervical (3-4 mm) Anterolisthesis of C4 on C5 09/07/2017  ? Cervical foraminal stenosis (Bilateral) 09/07/2017  ? Acute kidney injury superimposed on CKD ([place]) 08/14/2017  ? Chronic lower extremity pain (2ry area of Pain) (Bilateral) (L>R) 05/13/2017  ? DDD (  degenerative disc disease), cervical 05/13/2017  ? Chronic cervical radicular pain (3ry area of Pain) (Bilateral) (L>R) 05/04/2017  ? Chronic anticoagulation (Eliquis) 05/04/2017  ? Chronic hip pain (Bilateral) 04/20/2017  ? Chronic neck pain 03/01/2017  ? Hyperlipidemia 02/02/2017  ? Red blood cell antibody positive 11/18/2016  ? Anticoagulated 11/17/2016  ? Atrial fibrillation and flutter ([place]) 11/17/2016  ? Osteoarthritis involving multiple joints 06/14/2016  ? Cardiac pacemaker in situ 01/07/2016  ? S/P cardiac pacemaker procedure 01/07/2016  ? GERD (gastroesophageal  reflux disease) 12/18/2015  ? Chronic pain syndrome 06/25/2015  ? Chronic low back pain (1ry area of Pain) (Bilateral) (L>R) w/ sciatica (Bilateral) 06/25/2015  ? Lumbar facet syndrome (Bilateral) (L>R) 06/25/2015  ? Laurena Spies

## 2021-12-16 NOTE — Patient Instructions (Signed)
____________________________________________________________________________________________ ? ?Medication Rules ? ?Purpose: To inform patients, and their family members, of our rules and regulations. ? ?Applies to: All patients receiving prescriptions (written or electronic). ? ?Pharmacy of record: Pharmacy where electronic prescriptions will be sent. If written prescriptions are taken to a different pharmacy, please inform the nursing staff. The pharmacy listed in the electronic medical record should be the one where you would like electronic prescriptions to be sent. ? ?Electronic prescriptions: In compliance with the [place] Strengthen Opioid Misuse Prevention (STOP) Act of 2017 (Session Law 2017-74/H243), effective August 23, 2018, all controlled substances must be electronically prescribed. Calling prescriptions to the pharmacy will cease to exist. ? ?Prescription refills: Only during scheduled appointments. Applies to all prescriptions. ? ?NOTE: The following applies primarily to controlled substances (Opioid* Pain Medications).  ? ?Type of encounter (visit): For patients receiving controlled substances, face-to-face visits are required. (Not an option or up to the patient.) ? ?Patient's responsibilities: ?Pain Pills: Bring all pain pills to every appointment (except for procedure appointments). ?Pill Bottles: Bring pills in original pharmacy bottle. Always bring the newest bottle. Bring bottle, even if empty. ?Medication refills: You are responsible for knowing and keeping track of what medications you take and those you need refilled. ?The day before your appointment: write a list of all prescriptions that need to be refilled. ?The day of the appointment: give the list to the admitting nurse. Prescriptions will be written only during appointments. No prescriptions will be written on procedure days. ?If you forget a medication: it will not be "Called in", "Faxed", or "electronically sent". You will  need to get another appointment to get these prescribed. ?No early refills. Do not call asking to have your prescription filled early. ?Prescription Accuracy: You are responsible for carefully inspecting your prescriptions before leaving our office. Have the discharge nurse carefully go over each prescription with you, before taking them home. Make sure that your name is accurately spelled, that your address is correct. Check the name and dose of your medication to make sure it is accurate. Check the number of pills, and the written instructions to make sure they are clear and accurate. Make sure that you are given enough medication to last until your next medication refill appointment. ?Taking Medication: Take medication as prescribed. When it comes to controlled substances, taking less pills or less frequently than prescribed is permitted and encouraged. ?Never take more pills than instructed. ?Never take medication more frequently than prescribed.  ?Inform other Doctors: Always inform, all of your healthcare providers, of all the medications you take. ?Pain Medication from other Providers: You are not allowed to accept any additional pain medication from any other Doctor or Healthcare provider. There are two exceptions to this rule. (see below) In the event that you require additional pain medication, you are responsible for notifying us, as stated below. ?Cough Medicine: Often these contain an opioid, such as codeine or hydrocodone. Never accept or take cough medicine containing these opioids if you are already taking an opioid* medication. The combination may cause respiratory failure and death. ?Medication Agreement: You are responsible for carefully reading and following our Medication Agreement. This must be signed before receiving any prescriptions from our practice. Safely store a copy of your signed Agreement. Violations to the Agreement will result in no further prescriptions. (Additional copies of our  Medication Agreement are available upon request.) ?Laws, Rules, & Regulations: All patients are expected to follow all Federal and State Laws, Statutes, Rules, & Regulations. Ignorance of   the Laws does not constitute a valid excuse.  ?Illegal drugs and Controlled Substances: The use of illegal substances (including, but not limited to marijuana and its derivatives) and/or the illegal use of any controlled substances is strictly prohibited. Violation of this rule may result in the immediate and permanent discontinuation of any and all prescriptions being written by our practice. The use of any illegal substances is prohibited. ?Adopted CDC guidelines & recommendations: Target dosing levels will be at or below 60 MME/day. Use of benzodiazepines** is not recommended. ? ?Exceptions: There are only two exceptions to the rule of not receiving pain medications from other Healthcare Providers. ?Exception #1 (Emergencies): In the event of an emergency (i.e.: accident requiring emergency care), you are allowed to receive additional pain medication. However, you are responsible for: As soon as you are able, call our office [phone], at any time of the day or night, and leave a message stating your name, the date and nature of the emergency, and the name and dose of the medication prescribed. In the event that your call is answered by a member of our staff, make sure to document and save the date, time, and the name of the person that took your information.  ?Exception #2 (Planned Surgery): In the event that you are scheduled by another doctor or dentist to have any type of surgery or procedure, you are allowed (for a period no longer than 30 days), to receive additional pain medication, for the acute post-op pain. However, in this case, you are responsible for picking up a copy of our "Post-op Pain Management for Surgeons" handout, and giving it to your surgeon or dentist. This document is available at our office, and  does not require an appointment to obtain it. Simply go to our office during business hours (Monday-Thursday from 8:00 AM to 4:00 PM) (Friday 8:00 AM to 12:00 Noon) or if you have a scheduled appointment with us, prior to your surgery, and ask for it by name. In addition, you are responsible for: calling our office [phone], at any time of the day or night, and leaving a message stating your name, name of your surgeon, type of surgery, and date of procedure or surgery. Failure to comply with your responsibilities may result in termination of therapy involving the controlled substances. ?Medication Agreement Violation. Following the above rules, including your responsibilities will help you in avoiding a Medication Agreement Violation (?Breaking your Pain Medication Contract?). ? ?*Opioid medications include: morphine, codeine, oxycodone, oxymorphone, hydrocodone, hydromorphone, meperidine, tramadol, tapentadol, buprenorphine, fentanyl, methadone. ?**Benzodiazepine medications include: diazepam (Valium), alprazolam (Xanax), clonazepam (Klonopine), lorazepam (Ativan), clorazepate (Tranxene), chlordiazepoxide (Librium), estazolam (Prosom), oxazepam (Serax), temazepam (Restoril), triazolam (Halcion) ?(Last updated: 05/20/2021) ?____________________________________________________________________________________________ ? ____________________________________________________________________________________________ ? ?Medication Recommendations and Reminders ? ?Applies to: All patients receiving prescriptions (written and/or electronic). ? ?Medication Rules & Regulations: These rules and regulations exist for your safety and that of others. They are not flexible and neither are we. Dismissing or ignoring them will be considered "non-compliance" with medication therapy, resulting in complete and irreversible termination of such therapy. (See document titled "Medication Rules" for more details.) In all conscience,  because of safety reasons, we cannot continue providing a therapy where the patient does not follow instructions. ? ?Pharmacy of record:  ?Definition: This is the pharmacy where your electronic prescriptions w

## 2021-12-16 NOTE — Patient Instructions (Signed)
Visit Information ? ?Thank you for taking time to visit with me today. Please don't hesitate to contact me if I can be of assistance to you before our next scheduled telephone appointment. ? ?Following are the goals we discussed today:  ?Task & activities to accomplish goals: ?Contact PCP office with any questions or concerns ?Attend scheduled appointments ?Utilize resources discussed and/or provided  ? ?Our next appointment is by telephone on 02-05-22 at 10:00 AM ? ?Please call the care guide team at [phone] if you need to cancel or reschedule your appointment.  ? ?If you are experiencing a Mental Health or [place] or need someone to talk to, please call the Suicide and Crisis Lifeline: 988 ?call 911  ? ?Following is a copy of your full plan of care:  ?Care Plan : [place]  ?Updates made by Rebekah Chesterfield, LCSW since 12/16/2021 12:00 AM  ?  ? ?Problem: Quality of Life (General Plan of Care)   ?  ? ?Long-Range Goal: Quality of Life Maintained   ?Start Date: 12/15/2021  ?Expected End Date: 03/22/2022  ?This Visit's Progress: On track  ?Priority: High  ?Note:   ?Current Barriers:  ?Level of Care Concerns:Inability to perform IADL's independently, Inability to perform ADL's independently, and Unable to meet need without DME(Wheelchair/Hospital Bed) ? ?CSW Clinical Goal(s):  ?Caregiver  will verbalize understanding of plan for management of Atrial Fibrillation, CHF, CAD, COPD, CKD Stage 3, Osteoporosis, and Osteoarthritis  through collaboration with Clinical Social Worker, provider, and care team.  ? ?Interventions: ?Pt resides in her home with son. Granddaughter lives next door ?Patient was recently diagnosed with lung cancer and is participating in radiation treatment. Has difficulty managing chronic health conditions; however, receives strong support from family ?Family are working with Palliative care on obtaining a hospital bed and wheelchair. No additional resources needed at this  time ?Inter-disciplinary care team collaboration (see longitudinal plan of care) ?Evaluation of current treatment plan related to  self management and patient's adherence to plan as established by provider ?Review resources, discussed options and provided patient information about  ?Caregiver Support Resources ?Active listening / Reflection utilized  ?Emotional Support Provided ?Problem Solving /Task Center strategies reviewed ?Caregiver stress acknowledged  ?Verbalization of feelings encouraged  ? ?Task & activities to accomplish goals: ?Contact PCP office with any questions or concerns ?Attend scheduled appointments ?Utilize resources discussed and/or provided  ?  ? ? ?Ms. Szuch was given information about Care Management services by the embedded care coordination team including:  ?Care Management services include personalized support from designated clinical staff supervised by her physician, including individualized plan of care and coordination with other care providers ?24/7 contact phone numbers for assistance for urgent and routine care needs. ?The patient may stop CCM services at any time (effective at the end of the month) by phone call to the office staff. ? ?Patient agreed to services and verbal consent obtained.  ? ?Patient verbalizes understanding of instructions and care plan provided today and agrees to view in [place]. Active MyChart status confirmed with patient.   ? ?Christa See, MSW, LCSW ?[facility] Care Management ?[facility] ?[email or web address] ?Phone [phone] ?4:31 PM ?  ?

## 2021-12-17 ENCOUNTER — Ambulatory Visit: Payer: Medicare HMO

## 2021-12-17 ENCOUNTER — Other Ambulatory Visit: Payer: Self-pay

## 2021-12-17 ENCOUNTER — Emergency Department: Payer: Medicare HMO

## 2021-12-17 ENCOUNTER — Ambulatory Visit
Admission: RE | Admit: 2021-12-17 | Discharge: 2021-12-17 | Disposition: A | Discharge status: Home / Self Care | Payer: Medicare HMO | Source: Ambulatory Visit | Attending: Radiation Oncology | Admitting: Radiation Oncology

## 2021-12-17 ENCOUNTER — Inpatient Hospital Stay
Admission: EM | Admit: 2021-12-17 | Discharge: 2021-12-19 | DRG: 871 | Disposition: A | Discharge status: Home Health | Payer: Medicare HMO | Attending: Obstetrics and Gynecology | Admitting: Obstetrics and Gynecology

## 2021-12-17 DIAGNOSIS — R0600 Dyspnea, unspecified: Secondary | ICD-10-CM | POA: Diagnosis not present

## 2021-12-17 DIAGNOSIS — I4510 Unspecified right bundle-branch block: Secondary | ICD-10-CM | POA: Diagnosis present

## 2021-12-17 DIAGNOSIS — D631 Anemia in chronic kidney disease: Secondary | ICD-10-CM | POA: Diagnosis present

## 2021-12-17 DIAGNOSIS — Z888 Allergy status to other drugs, medicaments and biological substances status: Secondary | ICD-10-CM

## 2021-12-17 DIAGNOSIS — Z96653 Presence of artificial knee joint, bilateral: Secondary | ICD-10-CM | POA: Diagnosis present

## 2021-12-17 DIAGNOSIS — I495 Sick sinus syndrome: Secondary | ICD-10-CM | POA: Diagnosis present

## 2021-12-17 DIAGNOSIS — I5032 Chronic diastolic (congestive) heart failure: Secondary | ICD-10-CM | POA: Diagnosis present

## 2021-12-17 DIAGNOSIS — Z8249 Family history of ischemic heart disease and other diseases of the circulatory system: Secondary | ICD-10-CM

## 2021-12-17 DIAGNOSIS — Z803 Family history of malignant neoplasm of breast: Secondary | ICD-10-CM

## 2021-12-17 DIAGNOSIS — N3281 Overactive bladder: Secondary | ICD-10-CM | POA: Diagnosis present

## 2021-12-17 DIAGNOSIS — I5033 Acute on chronic diastolic (congestive) heart failure: Secondary | ICD-10-CM | POA: Diagnosis present

## 2021-12-17 DIAGNOSIS — C3490 Malignant neoplasm of unspecified part of unspecified bronchus or lung: Secondary | ICD-10-CM | POA: Diagnosis not present

## 2021-12-17 DIAGNOSIS — M533 Sacrococcygeal disorders, not elsewhere classified: Secondary | ICD-10-CM | POA: Diagnosis present

## 2021-12-17 DIAGNOSIS — R400 Somnolence: Secondary | ICD-10-CM | POA: Diagnosis not present

## 2021-12-17 DIAGNOSIS — M4722 Other spondylosis with radiculopathy, cervical region: Secondary | ICD-10-CM | POA: Diagnosis present

## 2021-12-17 DIAGNOSIS — E785 Hyperlipidemia, unspecified: Secondary | ICD-10-CM | POA: Diagnosis present

## 2021-12-17 DIAGNOSIS — Z87891 Personal history of nicotine dependence: Secondary | ICD-10-CM

## 2021-12-17 DIAGNOSIS — J9621 Acute and chronic respiratory failure with hypoxia: Secondary | ICD-10-CM | POA: Diagnosis not present

## 2021-12-17 DIAGNOSIS — M159 Polyosteoarthritis, unspecified: Secondary | ICD-10-CM | POA: Diagnosis present

## 2021-12-17 DIAGNOSIS — Z881 Allergy status to other antibiotic agents status: Secondary | ICD-10-CM

## 2021-12-17 DIAGNOSIS — G2581 Restless legs syndrome: Secondary | ICD-10-CM | POA: Diagnosis present

## 2021-12-17 DIAGNOSIS — Z7901 Long term (current) use of anticoagulants: Secondary | ICD-10-CM

## 2021-12-17 DIAGNOSIS — J189 Pneumonia, unspecified organism: Secondary | ICD-10-CM | POA: Diagnosis present

## 2021-12-17 DIAGNOSIS — G894 Chronic pain syndrome: Secondary | ICD-10-CM | POA: Diagnosis present

## 2021-12-17 DIAGNOSIS — N183 Chronic kidney disease, stage 3 unspecified: Secondary | ICD-10-CM | POA: Diagnosis present

## 2021-12-17 DIAGNOSIS — S91002A Unspecified open wound, left ankle, initial encounter: Secondary | ICD-10-CM | POA: Diagnosis not present

## 2021-12-17 DIAGNOSIS — R9431 Abnormal electrocardiogram [ECG] [EKG]: Secondary | ICD-10-CM | POA: Diagnosis not present

## 2021-12-17 DIAGNOSIS — C3411 Malignant neoplasm of upper lobe, right bronchus or lung: Secondary | ICD-10-CM | POA: Diagnosis not present

## 2021-12-17 DIAGNOSIS — Z20822 Contact with and (suspected) exposure to covid-19: Secondary | ICD-10-CM | POA: Diagnosis present

## 2021-12-17 DIAGNOSIS — Z510 Encounter for antineoplastic radiation therapy: Secondary | ICD-10-CM | POA: Diagnosis not present

## 2021-12-17 DIAGNOSIS — D696 Thrombocytopenia, unspecified: Secondary | ICD-10-CM | POA: Diagnosis not present

## 2021-12-17 DIAGNOSIS — L03116 Cellulitis of left lower limb: Secondary | ICD-10-CM | POA: Diagnosis present

## 2021-12-17 DIAGNOSIS — I7121 Aneurysm of the ascending aorta, without rupture: Secondary | ICD-10-CM | POA: Diagnosis present

## 2021-12-17 DIAGNOSIS — I480 Paroxysmal atrial fibrillation: Secondary | ICD-10-CM | POA: Diagnosis present

## 2021-12-17 DIAGNOSIS — M5137 Other intervertebral disc degeneration, lumbosacral region: Secondary | ICD-10-CM | POA: Diagnosis present

## 2021-12-17 DIAGNOSIS — Z841 Family history of disorders of kidney and ureter: Secondary | ICD-10-CM

## 2021-12-17 DIAGNOSIS — I248 Other forms of acute ischemic heart disease: Secondary | ICD-10-CM | POA: Diagnosis not present

## 2021-12-17 DIAGNOSIS — Z8616 Personal history of COVID-19: Secondary | ICD-10-CM

## 2021-12-17 DIAGNOSIS — M7989 Other specified soft tissue disorders: Secondary | ICD-10-CM | POA: Diagnosis not present

## 2021-12-17 DIAGNOSIS — Z823 Family history of stroke: Secondary | ICD-10-CM

## 2021-12-17 DIAGNOSIS — A419 Sepsis, unspecified organism: Principal | ICD-10-CM | POA: Diagnosis present

## 2021-12-17 DIAGNOSIS — I252 Old myocardial infarction: Secondary | ICD-10-CM

## 2021-12-17 DIAGNOSIS — R4182 Altered mental status, unspecified: Secondary | ICD-10-CM | POA: Diagnosis present

## 2021-12-17 DIAGNOSIS — I10 Essential (primary) hypertension: Secondary | ICD-10-CM | POA: Diagnosis present

## 2021-12-17 DIAGNOSIS — I130 Hypertensive heart and chronic kidney disease with heart failure and stage 1 through stage 4 chronic kidney disease, or unspecified chronic kidney disease: Secondary | ICD-10-CM | POA: Diagnosis present

## 2021-12-17 DIAGNOSIS — K219 Gastro-esophageal reflux disease without esophagitis: Secondary | ICD-10-CM | POA: Diagnosis present

## 2021-12-17 DIAGNOSIS — Z950 Presence of cardiac pacemaker: Secondary | ICD-10-CM

## 2021-12-17 DIAGNOSIS — Z853 Personal history of malignant neoplasm of breast: Secondary | ICD-10-CM

## 2021-12-17 DIAGNOSIS — C771 Secondary and unspecified malignant neoplasm of intrathoracic lymph nodes: Secondary | ICD-10-CM | POA: Diagnosis not present

## 2021-12-17 DIAGNOSIS — M109 Gout, unspecified: Secondary | ICD-10-CM | POA: Diagnosis present

## 2021-12-17 DIAGNOSIS — N1832 Chronic kidney disease, stage 3b: Secondary | ICD-10-CM | POA: Diagnosis present

## 2021-12-17 LAB — CBC WITH DIFFERENTIAL/PLATELET
Abs Immature Granulocytes: 0.04 10*3/uL (ref 0.00–0.07)
Basophils Absolute: 0 10*3/uL (ref 0.0–0.1)
Basophils Relative: 1 %
Eosinophils Absolute: 0.1 10*3/uL (ref 0.0–0.5)
Eosinophils Relative: 1 %
HCT: 34.2 % — ABNORMAL LOW (ref 36.0–46.0)
Hemoglobin: 10.5 g/dL — ABNORMAL LOW (ref 12.0–15.0)
Immature Granulocytes: 1 %
Lymphocytes Relative: 7 %
Lymphs Abs: 0.6 10*3/uL — ABNORMAL LOW (ref 0.7–4.0)
MCH: 27.4 pg (ref 26.0–34.0)
MCHC: 30.7 g/dL (ref 30.0–36.0)
MCV: 89.3 fL (ref 80.0–100.0)
Monocytes Absolute: 0.4 10*3/uL (ref 0.1–1.0)
Monocytes Relative: 5 %
Neutro Abs: 7.6 10*3/uL (ref 1.7–7.7)
Neutrophils Relative %: 85 %
Platelets: 163 10*3/uL (ref 150–400)
RBC: 3.83 MIL/uL — ABNORMAL LOW (ref 3.87–5.11)
RDW: 17.3 % — ABNORMAL HIGH (ref 11.5–15.5)
WBC: 8.7 10*3/uL (ref 4.0–10.5)
nRBC: 0 % (ref 0.0–0.2)

## 2021-12-17 LAB — COMPREHENSIVE METABOLIC PANEL
ALT: 26 U/L (ref 0–44)
AST: 38 U/L (ref 15–41)
Albumin: 3.5 g/dL (ref 3.5–5.0)
Alkaline Phosphatase: 102 U/L (ref 38–126)
Anion gap: 9 (ref 5–15)
BUN: 34 mg/dL — ABNORMAL HIGH (ref 8–23)
CO2: 24 mmol/L (ref 22–32)
Calcium: 9.1 mg/dL (ref 8.9–10.3)
Chloride: 104 mmol/L (ref 98–111)
Creatinine, Ser: 1.33 mg/dL — ABNORMAL HIGH (ref 0.44–1.00)
GFR, Estimated: 38 mL/min — ABNORMAL LOW (ref >60)
Glucose, Bld: 108 mg/dL — ABNORMAL HIGH (ref 70–99)
Potassium: 3.9 mmol/L (ref 3.5–5.1)
Sodium: 137 mmol/L (ref 135–145)
Total Bilirubin: 1.1 mg/dL (ref 0.3–1.2)
Total Protein: 7.4 g/dL (ref 6.5–8.1)

## 2021-12-17 LAB — RAD ONC ARIA SESSION SUMMARY
Course Elapsed Days: 9
Plan Fractions Treated to Date: 8
Plan Prescribed Dose Per Fraction: 6 Gy
Plan Total Fractions Prescribed: 10
Plan Total Prescribed Dose: 60 Gy
Reference Point Dosage Given to Date: 48 Gy
Reference Point Session Dosage Given: 6 Gy
Session Number: 8

## 2021-12-17 LAB — RESP PANEL BY RT-PCR (FLU A&B, COVID) ARPGX2
Influenza A by PCR: NEGATIVE
Influenza B by PCR: NEGATIVE
SARS Coronavirus 2 by RT PCR: NEGATIVE

## 2021-12-17 LAB — TROPONIN I (HIGH SENSITIVITY): Troponin I (High Sensitivity): 65 ng/L — ABNORMAL HIGH (ref <18)

## 2021-12-17 LAB — BRAIN NATRIURETIC PEPTIDE: B Natriuretic Peptide: 1454.7 pg/mL — ABNORMAL HIGH (ref 0.0–100.0)

## 2021-12-17 LAB — LACTIC ACID, PLASMA: Lactic Acid, Venous: 1.9 mmol/L (ref 0.5–1.9)

## 2021-12-17 MED ORDER — SODIUM CHLORIDE 0.9 % IV SOLN
2.0000 g | Freq: Once | INTRAVENOUS | Status: AC
  Administered 2021-12-17: 2 g via INTRAVENOUS
  Filled 2021-12-17: qty 12.5

## 2021-12-17 MED ORDER — ALBUTEROL SULFATE (2.5 MG/3ML) 0.083% IN NEBU: 2.5000 mg | INHALATION_SOLUTION | Freq: Four times a day (QID) | RESPIRATORY_TRACT | Status: DC | PRN

## 2021-12-17 MED ORDER — SODIUM CHLORIDE 0.9 % IV BOLUS: 500.0000 mL | Freq: Once | INTRAVENOUS | Status: DC

## 2021-12-17 MED ORDER — SODIUM CHLORIDE 0.9 % IV SOLN
20.0000 mL/h | INTRAVENOUS | Status: DC
  Administered 2021-12-18: 20 mL/h via INTRAVENOUS

## 2021-12-17 MED ORDER — ACETAMINOPHEN 500 MG PO TABS
500.0000 mg | ORAL_TABLET | Freq: Four times a day (QID) | ORAL | Status: DC | PRN
  Administered 2021-12-18 (×2): 500 mg via ORAL
  Filled 2021-12-17 (×2): qty 1

## 2021-12-17 MED ORDER — ROPINIROLE HCL 1 MG PO TABS
4.0000 mg | ORAL_TABLET | Freq: Every day | ORAL | Status: DC
  Administered 2021-12-18 (×2): 4 mg via ORAL
  Filled 2021-12-17 (×2): qty 4

## 2021-12-17 MED ORDER — VANCOMYCIN HCL 1750 MG/350ML IV SOLN
1750.0000 mg | Freq: Once | INTRAVENOUS | Status: AC
  Administered 2021-12-17: 1750 mg via INTRAVENOUS
  Filled 2021-12-17: qty 350

## 2021-12-17 MED ORDER — GABAPENTIN 300 MG PO CAPS
300.0000 mg | ORAL_CAPSULE | Freq: Three times a day (TID) | ORAL | Status: DC
  Administered 2021-12-18 – 2021-12-19 (×4): 300 mg via ORAL
  Filled 2021-12-17 (×5): qty 1

## 2021-12-17 MED ORDER — ATORVASTATIN CALCIUM 20 MG PO TABS
20.0000 mg | ORAL_TABLET | Freq: Every day | ORAL | Status: DC
  Administered 2021-12-18 – 2021-12-19 (×2): 20 mg via ORAL
  Filled 2021-12-17 (×2): qty 1

## 2021-12-17 MED ORDER — FUROSEMIDE 10 MG/ML IJ SOLN: 20.0000 mg | Freq: Two times a day (BID) | INTRAMUSCULAR | Status: DC

## 2021-12-17 MED ORDER — SODIUM CHLORIDE 0.9% FLUSH
3.0000 mL | Freq: Two times a day (BID) | INTRAVENOUS | Status: DC
  Administered 2021-12-18 – 2021-12-19 (×4): 3 mL via INTRAVENOUS

## 2021-12-17 MED ORDER — APIXABAN 2.5 MG PO TABS
2.5000 mg | ORAL_TABLET | Freq: Two times a day (BID) | ORAL | Status: DC
  Administered 2021-12-18 – 2021-12-19 (×3): 2.5 mg via ORAL
  Filled 2021-12-17 (×4): qty 1

## 2021-12-17 MED ORDER — BUDESON-GLYCOPYRROL-FORMOTEROL 160-9-4.8 MCG/ACT IN AERO: 2.0000 | INHALATION_SPRAY | Freq: Two times a day (BID) | RESPIRATORY_TRACT | Status: DC

## 2021-12-17 MED ORDER — SODIUM CHLORIDE 0.9 % IV BOLUS
250.0000 mL | Freq: Once | INTRAVENOUS | Status: AC
  Administered 2021-12-17: 250 mL via INTRAVENOUS

## 2021-12-17 MED ORDER — METRONIDAZOLE 500 MG/100ML IV SOLN
500.0000 mg | Freq: Once | INTRAVENOUS | Status: AC
  Administered 2021-12-17: 500 mg via INTRAVENOUS
  Filled 2021-12-17: qty 100

## 2021-12-17 NOTE — ED Triage Notes (Signed)
Pt brought from home for weakness and lethargy per home health nurse. Baseline O2 is 3L EMS initial O2 was 74%. Maintaining on 4L at this time. 1g of tylenol given for a fever. Cough and Hx of lung cancer ?

## 2021-12-17 NOTE — Consult Note (Signed)
CODE SEPSIS - PHARMACY COMMUNICATION ? ?**Broad Spectrum Antibiotics should be administered within 1 hour of Sepsis diagnosis** ? ?Time Code Sepsis Called/Page Received: 2114 ? ?Antibiotics Ordered: cefepime, metronidazole, Vancomycin ? ?Time of 1st antibiotic administration: 2138 ? ? ? ? ? ?Dorothe Pea, PharmD, BCPS ?Clinical Pharmacist   ?12/17/2021  9:14 PM ? ? ?

## 2021-12-17 NOTE — Consult Note (Signed)
PHARMACY -  BRIEF ANTIBIOTIC NOTE  ? ?Pharmacy has received consult(s) for cefepime and Vanomycin from an ED provider.  The patient's profile has been reviewed for ht/wt/allergies/indication/available labs.   ? ?One time order(s) placed for  ?Cefepime 2 gram ?Vancomycin 1750 mg -- infused over 3 hours due to hx of infusion related reaction in past ? ?Further antibiotics/pharmacy consults should be ordered by admitting physician if indicated.       ?                ?Thank you, ?Dorothe Pea, PharmD, BCPS ?Clinical Pharmacist   ?12/17/2021  9:24 PM  ?

## 2021-12-17 NOTE — H&P (Signed)
?History and Physical  ? ? ?Patient: Kristin [name] DOB: 11/21/1929 ?DOA: 12/17/2021 ?DOS: the patient was seen and examined on 12/18/2021 ?PCP: Olin Hauser, DO  ?Patient coming from: Home ? ?Chief Complaint:  ?Chief Complaint  ?Patient presents with  ? Code Sepsis  ? Shortness of Breath  ? ?HPI: Kristin [name] is a 88 y.o. female with medical history significant of chronic kidney disease, lung cancer currently being treated with radiation, paroxysmal atrial fibrillation on anticoagulation, chronic diastolic congestive heart failure, hypertension, restless leg syndrome, degenerative disc disease of the lumbar spine brought to the emergency room today for altered mental status lethargy concerns of fever by EMS.  Patient was given Tylenol and she does report increasing in the cough at baseline she is on 3 L oxygen and satting 74%.  Patient is oriented but very lethargic and sleepy.  Patient has a wound on her left ankle that she is reporting is doing better per the wound doctor.  Patient on arrival to the ED does meet sepsis criteria with fever respiratory rate source of possible infection, patient's lactic acid was right at the border at 1.9.  Normal white count.  Respiratory panel is negative for influenza and COVID.  CT angio done in 2016 showed aneurysmal dilatation of the ascending aorta at 4.3 cm, mediastinal right hilar lymphadenopathy with concerns for sarcoidosis. ? ?Review of Systems: Review of Systems  ?Unable to perform ROS: Age  ? ? ?Past Medical History:  ?Diagnosis Date  ? Acute postoperative pain 02/09/2018  ? Arrhythmia, sinus node 04/03/2014  ? Arthritis   ? Arthritis   ? Atrial fibrillation ([place])   ? Back pain   ? lower back chronic  ? Bradycardia   ? Breast cancer ([place]) 2004  ? right breast cancer  ? Cancer [facility]) 2004  ? rt breast cancer-post lumpectomy- chemo/rad  ? CHF (congestive heart failure) ([place])   ? Chronic back pain   ? CKD (chronic kidney disease)   ? COPD (chronic  obstructive pulmonary disease) ([place])   ? wears O2 at 2L via [place] at night  ? COVID-19 06/11/2020  ? Cystocele   ? Decubitus ulcers   ? Dehydration   ? Gout   ? Hematuria   ? Hepatitis C   ? Hyperlipidemia   ? Hypertension   ? Hypomagnesemia 06/25/2015  ? NSTEMI (non-ST elevated myocardial infarction) ([place]) 04/08/2021  ? OAB (overactive bladder)   ? Overactive bladder   ? Overdose opiate, accidental or unintentional, sequela 10/02/2020  ? Restless leg   ? Shoulder pain, bilateral   ? Urinary frequency   ? UTI (lower urinary tract infection)   ? Vaginal atrophy   ? ?Past Surgical History:  ?Procedure Laterality Date  ? ABDOMINAL HYSTERECTOMY    ? BACK SURGERY    ? BREAST LUMPECTOMY Right 2004  ? BREAST SURGERY    ? rt lumpectomy  ? CARPAL TUNNEL RELEASE    ? CATARACT EXTRACTION W/PHACO  10/19/2011  ? Procedure: CATARACT EXTRACTION PHACO AND INTRAOCULAR LENS PLACEMENT (IOC);  Surgeon: Elta Guadeloupe T. Gershon Crane, MD;  Location: [facility];  Service: Ophthalmology;  Laterality: Right;  CDE:10.81  ? CATARACT EXTRACTION W/PHACO  11/02/2011  ? Procedure: CATARACT EXTRACTION PHACO AND INTRAOCULAR LENS PLACEMENT (IOC);  Surgeon: Elta Guadeloupe T. Gershon Crane, MD;  Location: [facility];  Service: Ophthalmology;  Laterality: Left;  CDE 8.60  ? CHOLECYSTECTOMY    ? 3/18  ? COLON SURGERY    ? INSERT / REPLACE / REMOVE PACEMAKER    ?  JOINT REPLACEMENT    ? bilateral TKA  ? LOWER EXTREMITY ANGIOGRAPHY Left 10/14/2020  ? Procedure: LOWER EXTREMITY ANGIOGRAPHY;  Surgeon: Katha Cabal, MD;  Location: [place] [facility];  Service: Cardiovascular;  Laterality: Left;  ? LOWER EXTREMITY ANGIOGRAPHY Right 09/08/2021  ? Procedure: LOWER EXTREMITY ANGIOGRAPHY;  Surgeon: Katha Cabal, MD;  Location: [place] [facility];  Service: Cardiovascular;  Laterality: Right;  ? PACEMAKER INSERTION N/A 12/24/2015  ? Procedure: INSERTION PACEMAKER;  Surgeon: Isaias Cowman, MD;  Location: [facility] ORS;  Service: Cardiovascular;  Laterality: N/A;  ? ROTATOR CUFF REPAIR    ?  bilateral  ? VIDEO BRONCHOSCOPY WITH ENDOBRONCHIAL NAVIGATION Right 11/11/2021  ? Procedure: VIDEO BRONCHOSCOPY WITH ENDOBRONCHIAL NAVIGATION;  Surgeon: Ottie Glazier, MD;  Location: [facility] ORS;  Service: Thoracic;  Laterality: Right;  ? ?Social History:  reports that she quit smoking about 23 years ago. Her smoking use included cigarettes. She has a 50.00 pack-year smoking history. She has never used smokeless tobacco. She reports current drug use. Drug: Oxycodone. She reports that she does not drink alcohol. ? ?Allergies  ?Allergen Reactions  ? Flexeril [Cyclobenzaprine Hcl]   ?  Severe Rash  ? Vancomycin Rash  ?  [place] Syndrome  ? ? ?Family History  ?Problem Relation Age of Onset  ? Kidney disease Brother   ? Heart disease Father   ? Heart disease Mother   ? Stroke Mother   ? Cancer Sister   ? Breast cancer Sister 32  ? Breast cancer Paternal Aunt   ? Anesthesia problems Neg Hx   ? Hypotension Neg Hx   ? Malignant hyperthermia Neg Hx   ? Pseudochol deficiency Neg Hx   ? ? ?Prior to Admission medications   ?Medication Sig Start Date End Date Taking? Authorizing Provider  ?acetaminophen (TYLENOL) 500 MG tablet Take 500 mg by mouth in the morning and at bedtime.    [provider]  ?albuterol (PROVENTIL) (2.5 MG/3ML) 0.083% nebulizer solution USE 1 VIAL IN NEBULIZER EVERY 8 HOURS AS NEEDED FOR WHEEZING OR SHORTNESS OF BREATH. 10/20/21   Parks Ranger, Devonne Doughty, DO  ?apixaban (ELIQUIS) 2.5 MG TABS tablet Take 1 tablet (2.5 mg total) by mouth 2 (two) times daily. 04/09/21   Loletha Grayer, MD  ?atorvastatin (LIPITOR) 20 MG tablet TAKE 1 TABLET BY MOUTH ONCE DAILY FOR CHOLESTEROL 02/17/21   Karamalegos, Devonne Doughty, DO  ?bisacodyl (DULCOLAX) 5 MG EC tablet Take 2 tablets (10 mg total) by mouth daily as needed for moderate constipation. 07/04/20   Karamalegos, Devonne Doughty, DO  ?BREZTRI AEROSPHERE 160-9-4.8 MCG/ACT AERO Inhale 2 puffs into the lungs 2 (two) times daily. 07/07/21   Olin Hauser, DO   ?Cholecalciferol (VITAMIN D) 2000 units tablet Take 2,000 Units by mouth daily.     [provider]  ?diclofenac Sodium (VOLTAREN) 1 % GEL Apply 4 g topically 2 (two) times daily as needed (As needed for pain). 10/15/21   Kris Hartmann, NP  ?empagliflozin (JARDIANCE) 10 MG TABS tablet Take 1 tablet (10 mg total) by mouth daily before breakfast. 12/14/21   Alisa Graff, FNP  ?feeding supplement (ENSURE ENLIVE / ENSURE PLUS) LIQD Take 237 mLs by mouth 2 (two) times daily between meals. 11/13/21   Annita Brod, MD  ?furosemide (LASIX) 40 MG tablet Take 1 tablet (40 mg total) by mouth daily. 09/23/21   Ezekiel Slocumb, DO  ?gabapentin (NEURONTIN) 300 MG capsule Take 1 capsule (300 mg total) by mouth 3 (three)  times daily. 11/12/21   Annita Brod, MD  ?[facility] ASPIRIN LOW DOSE 81 MG EC tablet TAKE 1 TABLET BY MOUTH ONCE DAILY. Ricka Burdock WHOLE* 08/18/21   Schnier, Dolores Lory, MD  ?levocetirizine (XYZAL) 5 MG tablet TAKE (1/2) TABLET BY MOUTH EVERY OTHER DAY. 06/19/21   Olin Hauser, DO  ?Melatonin 10 MG TABS Take 10 mg by mouth at bedtime.    [provider]  ?Multiple Vitamin (MULTIVITAMIN WITH MINERALS) TABS tablet Take 1 tablet by mouth daily. 11/13/21   Annita Brod, MD  ?oxyCODONE (OXY IR/ROXICODONE) 5 MG immediate release tablet Take 2 tablets (10 mg total) by mouth 2 (two) times daily. May also take 1 tablet (5 mg total) at bedtime as needed for severe pain. Must last 30 days.. 12/23/21 01/22/22  Milinda Pointer, MD  ?oxyCODONE (OXY IR/ROXICODONE) 5 MG immediate release tablet Take 2 tablets (10 mg total) by mouth 2 (two) times daily. May also take 1 tablet (5 mg total) at bedtime as needed for severe pain. Must last 30 days.. 01/22/22 02/21/22  Milinda Pointer, MD  ?oxyCODONE (OXY IR/ROXICODONE) 5 MG immediate release tablet Take 2 tablets (10 mg total) by mouth 2 (two) times daily. May also take 1 tablet (5 mg total) at bedtime as needed for severe pain. Must last 30  days.. 02/21/22 03/23/22  Milinda Pointer, MD  ?OXYGEN Inhale 2 L into the lungs daily.    [provider]  ?rOPINIRole (REQUIP) 2 MG tablet TAKE 2 TABLETS BY MOUTH AT BEDTIME 10/21/21   [name]

## 2021-12-17 NOTE — ED Provider Notes (Signed)
? ?[facility] ?Provider Note ? ? ? Event Date/Time  ? First MD Initiated Contact with Patient 12/17/21 2052   ?  (approximate) ? ? ?History  ? ?Code Sepsis and Shortness of Breath ? ? ?HPI ? ?Kristin [name] is a 88 y.o. female with known lung cancer currently being treated with radiation who comes in with concerns for fevers.  Some time and from home due to concern for more weakness and lethargy and patient had a fever of 800.7 with EMS and was given a gram of Tylenol.  Patient does report worsening cough.  She typically is on 3 L of oxygen and was reportedly satting 74% but patient was placed on 4 L with increasing oxygen levels.  She denies any falls, hitting her head.  Denies any abdominal pain.  She does have a wound on her left foot.  Feel does report that she was recently given some doses of Lasix due to fluid overload. ? ? ?Physical Exam  ? ?Triage Vital Signs: ?ED Triage Vitals  ?Enc Vitals Group  ?   BP 12/17/21 2055 (!) 99/55  ?   Pulse Rate 12/17/21 2055 (!) 32  ?   Resp 12/17/21 2055 (!) 28  ?   Temp 12/17/21 2055 (!) 100.7 ?F (38.2 ?C)  ?   Temp Source 12/17/21 2055 Oral  ?   SpO2 12/17/21 2055 100 %  ?   Weight 12/17/21 2053 169 lb 8.5 oz (76.9 kg)  ?   Height --   ?   Head Circumference --   ?   Peak Flow --   ?   Pain Score 12/17/21 2052 5  ?   Pain Loc --   ?   Pain Edu? --   ?   Excl. in [place]? --   ? ? ?Most recent vital signs: ?Vitals:  ? 12/17/21 2055  ?BP: (!) 99/55  ?Pulse: (!) 32  ?Resp: (!) 28  ?Temp: (!) 100.7 ?F (38.2 ?C)  ?SpO2: 100%  ? ? ? ?General: Awake, no distress.  ?CV:  Good peripheral perfusion.  ?Resp:  Normal effort.  On 4 L ?Abd:  No distention.  Soft and nontender ?Other:  Patient has some edema noted in his bilateral legs.  Feet are warm and well-perfused however she does have ulcers noted on the left side of the left ankle with a little bit of discharge and redness noted around it. ? ? ?ED Results / Procedures / Treatments  ? ?Labs ?(all labs ordered are  listed, but only abnormal results are displayed) ?Labs Reviewed  ?CULTURE, BLOOD (ROUTINE X 2)  ?CULTURE, BLOOD (ROUTINE X 2)  ?RESP PANEL BY RT-PCR (FLU A&B, COVID) [id number]  ?LACTIC ACID, PLASMA  ?LACTIC ACID, PLASMA  ?BLOOD GAS, VENOUS  ?CBC WITH DIFFERENTIAL/PLATELET  ?COMPREHENSIVE METABOLIC PANEL  ?BRAIN NATRIURETIC PEPTIDE  ?TROPONIN I (HIGH SENSITIVITY)  ? ? ? ?EKG ? ?My interpretation of EKG: ? ?Sinus rate of 90 with right bundle branch block, no ST elevation or T wave inversions ? ?RADIOLOGY ?I have reviewed the xray personally and see pulm infiltrates bilaterally. ? ?PROCEDURES: ? ?Critical Care performed: Yes, see critical care procedure note(s) ? ?.Critical Care ?Performed by: Vanessa [place], MD ?Authorized by: Vanessa [place], MD  ? ?Critical care provider statement:  ?  Critical care time (minutes):  30 ?  Critical care was necessary to treat or prevent imminent or life-threatening deterioration of the following conditions:  Sepsis ?  Critical care was  time spent personally by me on the following activities:  Development of treatment plan with patient or surrogate, discussions with consultants, evaluation of patient's response to treatment, examination of patient, ordering and review of laboratory studies, ordering and review of radiographic studies, ordering and performing treatments and interventions, pulse oximetry, re-evaluation of patient's condition and review of old charts ?.1-3 Lead EKG Interpretation ?Performed by: Vanessa Abern[place], MD ?Authorized by: Vanessa [place], MD  ? ?  Interpretation: normal   ?  ECG rate:  70 ?  ECG rate assessment: normal   ?  Rhythm: sinus rhythm   ?  Ectopy: none   ?  Conduction: normal   ? ? ?MEDICATIONS ORDERED IN ED: ?Medications  ?metroNIDAZOLE (FLAGYL) IVPB 500 mg (has no administration in time range)  ?vancomycin (VANCOREADY) IVPB 1750 mg/350 mL (has no administration in time range)  ?ceFEPIme (MAXIPIME) 2 g in sodium chloride 0.9 % 100 mL IVPB (2 g Intravenous New  Bag/Given 12/17/21 2138)  ? ? ? ?IMPRESSION / MDM / ASSESSMENT AND PLAN / ED COURSE  ?I reviewed the triage vital signs and the nursing notes. ? ? ?Patient comes in febrile already given Tylenol, slightly low blood pressures but upon repeat testing was normal tensive and I do not want to start off with giving any fluids due to her CHF and looking a little fluid overload on exam.  Sepsis alert was called and will start on broad-spectrum antibiotics I suspect that this could be from a cellulitis of her left ankle. ? ?VBG shows no evidence of hypercapnia.  CBC shows stable hemoglobin, normal white count.  CMP shows stable creatinine.  Troponin slightly uptrending.  BNP slightly elevated so again holding off on fluids. ? ?Chest x-ray concerning for possible infection.  Patient started on broad-spectrum antibiotics to cover this versus ankle cellulitis.  Patient has allergy to vancomycin but is listed as "red man" syndrome and I discussed with patient's family the denies any anaphylactic symptoms.  Discussed with pharmacy and they want to try to proceed with vancomycin run at a slower rate by discussed with patient that if she developed any worsening shortness of breath to please let us know she expressed understanding felt comfortable with proceeding.  Patient's son is also at bedside has been with patient all day and denies any recent falls or hitting her head and they report that she is acting her normal self at this time.  Low suspicion for intracranial hemorrhage.  At this time and will admit patient for sepsis from cellulitis versus pneumonia ? ? ? ?The patient is on the cardiac monitor to evaluate for evidence of arrhythmia and/or significant heart rate changes. ? ?  ? ? ?FINAL CLINICAL IMPRESSION(S) / ED DIAGNOSES  ? ?Final diagnoses:  ?Malignant neoplasm of lung, unspecified laterality, unspecified part of lung ([place])  ?Sepsis, due to unspecified organism, unspecified whether acute organ dysfunction present [facility])   ?Community acquired pneumonia, unspecified laterality  ?Cellulitis of left lower extremity  ? ? ? ?Rx / DC Orders  ? ?ED Discharge Orders   ? ? None  ? ?  ? ? ? ?Note:  This document was prepared using Dragon voice recognition software and may include unintentional dictation errors. ?  ?Vanessa [place], MD ?12/17/21 2218 ? ?

## 2021-12-17 NOTE — Sepsis Progress Note (Signed)
Monitoring for the code sepsis protocol. °

## 2021-12-18 ENCOUNTER — Observation Stay: Payer: Medicare HMO

## 2021-12-18 ENCOUNTER — Ambulatory Visit: Payer: Medicare HMO

## 2021-12-18 DIAGNOSIS — R4182 Altered mental status, unspecified: Secondary | ICD-10-CM | POA: Diagnosis not present

## 2021-12-18 DIAGNOSIS — I480 Paroxysmal atrial fibrillation: Secondary | ICD-10-CM | POA: Diagnosis present

## 2021-12-18 DIAGNOSIS — C3411 Malignant neoplasm of upper lobe, right bronchus or lung: Secondary | ICD-10-CM | POA: Diagnosis present

## 2021-12-18 DIAGNOSIS — I7121 Aneurysm of the ascending aorta, without rupture: Secondary | ICD-10-CM | POA: Diagnosis present

## 2021-12-18 DIAGNOSIS — I5033 Acute on chronic diastolic (congestive) heart failure: Secondary | ICD-10-CM | POA: Diagnosis present

## 2021-12-18 DIAGNOSIS — Z853 Personal history of malignant neoplasm of breast: Secondary | ICD-10-CM | POA: Diagnosis not present

## 2021-12-18 DIAGNOSIS — J9621 Acute and chronic respiratory failure with hypoxia: Secondary | ICD-10-CM | POA: Diagnosis not present

## 2021-12-18 DIAGNOSIS — R400 Somnolence: Secondary | ICD-10-CM | POA: Diagnosis not present

## 2021-12-18 DIAGNOSIS — E785 Hyperlipidemia, unspecified: Secondary | ICD-10-CM | POA: Diagnosis present

## 2021-12-18 DIAGNOSIS — L03116 Cellulitis of left lower limb: Secondary | ICD-10-CM | POA: Diagnosis present

## 2021-12-18 DIAGNOSIS — I248 Other forms of acute ischemic heart disease: Secondary | ICD-10-CM | POA: Diagnosis not present

## 2021-12-18 DIAGNOSIS — Z20822 Contact with and (suspected) exposure to covid-19: Secondary | ICD-10-CM | POA: Diagnosis present

## 2021-12-18 DIAGNOSIS — R0902 Hypoxemia: Secondary | ICD-10-CM | POA: Diagnosis not present

## 2021-12-18 DIAGNOSIS — J189 Pneumonia, unspecified organism: Secondary | ICD-10-CM | POA: Diagnosis present

## 2021-12-18 DIAGNOSIS — I4510 Unspecified right bundle-branch block: Secondary | ICD-10-CM | POA: Diagnosis present

## 2021-12-18 DIAGNOSIS — R0602 Shortness of breath: Secondary | ICD-10-CM | POA: Diagnosis present

## 2021-12-18 DIAGNOSIS — M109 Gout, unspecified: Secondary | ICD-10-CM | POA: Diagnosis present

## 2021-12-18 DIAGNOSIS — A419 Sepsis, unspecified organism: Secondary | ICD-10-CM | POA: Diagnosis present

## 2021-12-18 DIAGNOSIS — D631 Anemia in chronic kidney disease: Secondary | ICD-10-CM | POA: Diagnosis present

## 2021-12-18 DIAGNOSIS — D696 Thrombocytopenia, unspecified: Secondary | ICD-10-CM | POA: Diagnosis not present

## 2021-12-18 DIAGNOSIS — N3281 Overactive bladder: Secondary | ICD-10-CM | POA: Diagnosis present

## 2021-12-18 DIAGNOSIS — I130 Hypertensive heart and chronic kidney disease with heart failure and stage 1 through stage 4 chronic kidney disease, or unspecified chronic kidney disease: Secondary | ICD-10-CM | POA: Diagnosis present

## 2021-12-18 DIAGNOSIS — Z7901 Long term (current) use of anticoagulants: Secondary | ICD-10-CM | POA: Diagnosis not present

## 2021-12-18 DIAGNOSIS — I509 Heart failure, unspecified: Secondary | ICD-10-CM | POA: Diagnosis not present

## 2021-12-18 DIAGNOSIS — G2581 Restless legs syndrome: Secondary | ICD-10-CM | POA: Diagnosis present

## 2021-12-18 DIAGNOSIS — J9690 Respiratory failure, unspecified, unspecified whether with hypoxia or hypercapnia: Secondary | ICD-10-CM | POA: Diagnosis not present

## 2021-12-18 DIAGNOSIS — I4891 Unspecified atrial fibrillation: Secondary | ICD-10-CM | POA: Diagnosis not present

## 2021-12-18 DIAGNOSIS — Z8616 Personal history of COVID-19: Secondary | ICD-10-CM | POA: Diagnosis not present

## 2021-12-18 DIAGNOSIS — J9601 Acute respiratory failure with hypoxia: Secondary | ICD-10-CM | POA: Diagnosis not present

## 2021-12-18 DIAGNOSIS — I252 Old myocardial infarction: Secondary | ICD-10-CM | POA: Diagnosis not present

## 2021-12-18 DIAGNOSIS — Z515 Encounter for palliative care: Secondary | ICD-10-CM | POA: Diagnosis not present

## 2021-12-18 DIAGNOSIS — N1832 Chronic kidney disease, stage 3b: Secondary | ICD-10-CM | POA: Diagnosis present

## 2021-12-18 DIAGNOSIS — I495 Sick sinus syndrome: Secondary | ICD-10-CM | POA: Diagnosis present

## 2021-12-18 LAB — COMPREHENSIVE METABOLIC PANEL
ALT: 83 U/L — ABNORMAL HIGH (ref 0–44)
AST: 168 U/L — ABNORMAL HIGH (ref 15–41)
Albumin: 3.1 g/dL — ABNORMAL LOW (ref 3.5–5.0)
Alkaline Phosphatase: 145 U/L — ABNORMAL HIGH (ref 38–126)
Anion gap: 10 (ref 5–15)
BUN: 32 mg/dL — ABNORMAL HIGH (ref 8–23)
CO2: 19 mmol/L — ABNORMAL LOW (ref 22–32)
Calcium: 8.6 mg/dL — ABNORMAL LOW (ref 8.9–10.3)
Chloride: 107 mmol/L (ref 98–111)
Creatinine, Ser: 1.28 mg/dL — ABNORMAL HIGH (ref 0.44–1.00)
GFR, Estimated: 40 mL/min — ABNORMAL LOW (ref >60)
Glucose, Bld: 97 mg/dL (ref 70–99)
Potassium: 4.4 mmol/L (ref 3.5–5.1)
Sodium: 136 mmol/L (ref 135–145)
Total Bilirubin: 1.1 mg/dL (ref 0.3–1.2)
Total Protein: 6.9 g/dL (ref 6.5–8.1)

## 2021-12-18 LAB — URINALYSIS, COMPLETE (UACMP) WITH MICROSCOPIC
Bacteria, UA: NONE SEEN
Bilirubin Urine: NEGATIVE
Glucose, UA: 150 mg/dL — AB
Hgb urine dipstick: NEGATIVE
Ketones, ur: NEGATIVE mg/dL
Nitrite: NEGATIVE
Protein, ur: NEGATIVE mg/dL
Specific Gravity, Urine: 1.032 — ABNORMAL HIGH (ref 1.005–1.030)
pH: 5 (ref 5.0–8.0)

## 2021-12-18 LAB — CBC
HCT: 36.9 % (ref 36.0–46.0)
Hemoglobin: 11.2 g/dL — ABNORMAL LOW (ref 12.0–15.0)
MCH: 27.6 pg (ref 26.0–34.0)
MCHC: 30.4 g/dL (ref 30.0–36.0)
MCV: 90.9 fL (ref 80.0–100.0)
Platelets: 122 10*3/uL — ABNORMAL LOW (ref 150–400)
RBC: 4.06 MIL/uL (ref 3.87–5.11)
RDW: 17.5 % — ABNORMAL HIGH (ref 11.5–15.5)
WBC: 9.4 10*3/uL (ref 4.0–10.5)
nRBC: 0 % (ref 0.0–0.2)

## 2021-12-18 LAB — LACTIC ACID, PLASMA
Lactic Acid, Venous: 2.8 mmol/L (ref 0.5–1.9)
Lactic Acid, Venous: 1.8 mmol/L (ref 0.5–1.9)

## 2021-12-18 LAB — PROCALCITONIN: Procalcitonin: 1.43 ng/mL

## 2021-12-18 LAB — STREP PNEUMONIAE URINARY ANTIGEN: Strep Pneumo Urinary Antigen: NEGATIVE

## 2021-12-18 LAB — TROPONIN I (HIGH SENSITIVITY): Troponin I (High Sensitivity): 63 ng/L — ABNORMAL HIGH (ref <18)

## 2021-12-18 MED ORDER — FUROSEMIDE 40 MG PO TABS: 40.0000 mg | ORAL_TABLET | Freq: Every day | ORAL | Status: DC

## 2021-12-18 MED ORDER — MOMETASONE FURO-FORMOTEROL FUM 200-5 MCG/ACT IN AERO
2.0000 | INHALATION_SPRAY | Freq: Two times a day (BID) | RESPIRATORY_TRACT | Status: DC
  Administered 2021-12-18 – 2021-12-19 (×3): 2 via RESPIRATORY_TRACT
  Filled 2021-12-18: qty 8.8

## 2021-12-18 MED ORDER — SODIUM CHLORIDE 0.9 % IV SOLN
2.0000 g | INTRAVENOUS | Status: DC
  Administered 2021-12-18 – 2021-12-19 (×2): 2 g via INTRAVENOUS
  Filled 2021-12-18 (×3): qty 20

## 2021-12-18 MED ORDER — OXYCODONE HCL 5 MG PO TABS
5.0000 mg | ORAL_TABLET | Freq: Three times a day (TID) | ORAL | Status: DC | PRN
  Filled 2021-12-18: qty 1

## 2021-12-18 MED ORDER — METHYLPREDNISOLONE SODIUM SUCC 40 MG IJ SOLR
40.0000 mg | Freq: Every day | INTRAMUSCULAR | Status: DC
  Administered 2021-12-18 – 2021-12-19 (×2): 40 mg via INTRAVENOUS
  Filled 2021-12-18 (×2): qty 1

## 2021-12-18 MED ORDER — SODIUM CHLORIDE 0.9 % IV SOLN
100.0000 mg | Freq: Two times a day (BID) | INTRAVENOUS | Status: DC
  Administered 2021-12-18 – 2021-12-19 (×3): 100 mg via INTRAVENOUS
  Filled 2021-12-18 (×4): qty 100

## 2021-12-18 MED ORDER — IOHEXOL 350 MG/ML SOLN
100.0000 mL | Freq: Once | INTRAVENOUS | Status: AC | PRN
  Administered 2021-12-18: 100 mL via INTRAVENOUS

## 2021-12-18 MED ORDER — FUROSEMIDE 10 MG/ML IJ SOLN
40.0000 mg | Freq: Two times a day (BID) | INTRAMUSCULAR | Status: DC
  Administered 2021-12-18 – 2021-12-19 (×2): 40 mg via INTRAVENOUS
  Filled 2021-12-18 (×2): qty 4

## 2021-12-18 MED ORDER — TIOTROPIUM BROMIDE MONOHYDRATE 18 MCG IN CAPS
18.0000 ug | ORAL_CAPSULE | Freq: Every day | RESPIRATORY_TRACT | Status: DC
  Administered 2021-12-18 – 2021-12-19 (×2): 18 ug via RESPIRATORY_TRACT
  Filled 2021-12-18: qty 5

## 2021-12-18 MED ORDER — FUROSEMIDE 10 MG/ML IJ SOLN: 20.0000 mg | Freq: Once | INTRAMUSCULAR | Status: DC

## 2021-12-18 NOTE — Consult Note (Signed)
? ?  ?Palliative Medicine ?[place] at [facility] ?Telephone:(336) [phone] Fax:(336) [phone] ? ? ?Name: Kristin [name] ?Date: 12/18/2021 ?MRN: [id number]  ?DOB: September 18, 1929 ? ?Patient Care Team: ?Olin Hauser, DO as PCP - General (Family Medicine) ?Olin Hauser, DO as Consulting Physician (Family Medicine) ?Isaias Cowman, MD as Consulting Physician (Cardiology) ?Milinda Pointer, MD as Referring Physician (Pain Medicine) ?Alisa Graff, FNP as Nurse Practitioner (Family Medicine) ?Florance, Tomasa Blase, RN as [place] Management ?Telford Nab, RN as Sales executive ?Vanita Ingles, RN as Case Manager ?Rebekah Chesterfield, LCSW as Education officer, museum (Licensed Holiday representative)  ? ? ?REASON FOR CONSULTATION: ?Kristin [name] is a 88 y.o. 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  CTA of the chest suggestive of superimposed pneumonia.  Palliative care was consulted to help address goals. ? ?SOCIAL HISTORY:    ? reports that she quit smoking about 23 years ago. Her smoking use included cigarettes. She has a 50.00 pack-year smoking history. She has never used smokeless tobacco. She reports current drug use. Drug: Oxycodone. She reports that she does not drink alcohol. ? ?Patient was never married.  She has a son with whom she lives.  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 ? ?ADVANCE DIRECTIVES:  ?Not on file ? ?CODE STATUS: Full code ? ?PAST MEDICAL HISTORY: ?Past Medical History:  ?Diagnosis Date  ? Acute postoperative pain 02/09/2018  ? Arrhythmia, sinus node 04/03/2014  ? Arthritis   ? Arthritis   ? Atrial fibrillation ([place])   ? Back pain   ? lower back  chronic  ? Bradycardia   ? Breast cancer ([place]) 2004  ? right breast cancer  ? Cancer [facility]) 2004  ? rt breast cancer-post lumpectomy- chemo/rad  ? CHF (congestive heart failure) ([place])   ? Chronic back pain   ? CKD (chronic kidney disease)   ? COPD (chronic obstructive pulmonary disease) ([place])   ? wears O2 at 2L via  at night  ? COVID-19 06/11/2020  ? Cystocele   ? Decubitus ulcers   ? Dehydration   ? Gout   ? Hematuria   ? Hepatitis C   ? Hyperlipidemia   ? Hypertension   ? Hypomagnesemia 06/25/2015  ? NSTEMI (non-ST elevated myocardial infarction) ([place]) 04/08/2021  ? OAB (overactive bladder)   ? Overactive bladder   ? Overdose opiate, accidental or unintentional, sequela 10/02/2020  ? Restless leg   ? Shoulder pain, bilateral   ? Urinary frequency   ? UTI (lower urinary tract infection)   ? Vaginal atrophy   ? ? ?PAST SURGICAL HISTORY:  ?Past Surgical History:  ?Procedure Laterality Date  ? ABDOMINAL HYSTERECTOMY    ? BACK SURGERY    ? BREAST LUMPECTOMY Right 2004  ? BREAST SURGERY    ? rt lumpectomy  ? CARPAL TUNNEL RELEASE    ? CATARACT EXTRACTION W/PHACO  10/19/2011  ? Procedure: CATARACT EXTRACTION PHACO AND INTRAOCULAR LENS PLACEMENT (IOC);  Surgeon: Elta Guadeloupe T. Gershon Crane, MD;  [facility];  Service: Ophthalmology;  Laterality: Right;  CDE:10.81  ? CATARACT EXTRACTION W/PHACO  11/02/2011  ? Procedure: CATARACT EXTRACTION PHACO AND INTRAOCULAR LENS PLACEMENT (IOC);  Surgeon: Elta Guadeloupe T. Gershon Crane, MD;  [facility]: [facility];  Service: Ophthalmology;  Laterality: Left;  CDE 8.60  ? CHOLECYSTECTOMY    ? 3/18  ? COLON SURGERY    ? INSERT / REPLACE / REMOVE PACEMAKER    ? JOINT REPLACEMENT    ? bilateral TKA  ? LOWER EXTREMITY ANGIOGRAPHY Left 10/14/2020  ? Procedure: LOWER EXTREMITY ANGIOGRAPHY;  Surgeon: Katha Cabal, MD;  Location: [place] CV LAB;  Service: Cardiovascular;  Laterality: Left;  ? LOWER EXTREMITY ANGIOGRAPHY Right 09/08/2021  ? Procedure: LOWER EXTREMITY ANGIOGRAPHY;  Surgeon: Katha Cabal, MD;   Location: [place] CV LAB;  Service: Cardiovascular;  Laterality: Right;  ? PACEMAKER INSERTION N/A 12/24/2015  ? Procedure: INSERTION PACEMAKER;  Surgeon: Isaias Cowman, MD;  Location: [facility] ORS;  Service: Cardiovascular;  Laterality: N/A;  ? ROTATOR CUFF REPAIR    ? bilateral  ? VIDEO BRONCHOSCOPY WITH ENDOBRONCHIAL NAVIGATION Right 11/11/2021  ? Procedure: VIDEO BRONCHOSCOPY WITH ENDOBRONCHIAL NAVIGATION;  Surgeon: Ottie Glazier, MD;  Location: [facility] ORS;  Service: Thoracic;  Laterality: Right;  ? ? ?HEMATOLOGY/ONCOLOGY HISTORY:  ?Oncology History  ?Malignant neoplasm of right upper lobe of lung ([place])  ?12/02/2021 Initial Diagnosis  ? Malignant neoplasm of right upper lobe of lung ([place]) ? ?  ?12/02/2021 Cancer Staging  ? Staging form: Lung, AJCC 8th Edition ?- Clinical: Stage IIB (cT2, cN1, cM0) - Signed by Cammie Sickle, MD on 12/02/2021 ?Histopathologic type: Small cell carcinoma, NOS ? ?  ? ? ?ALLERGIES:  is allergic to flexeril [cyclobenzaprine hcl] and vancomycin. ? ?MEDICATIONS:  ?Current Facility-Administered Medications  ?Medication Dose Route Frequency Provider Last Rate Last Admin  ? 0.9 %  sodium chloride infusion  20 mL/hr Intravenous Continuous Para Skeans, MD 20 mL/hr at 12/18/21 0231 20 mL/hr at 12/18/21 0231  ? acetaminophen (TYLENOL) tablet 500 mg  500 mg Oral Q6H PRN Para Skeans, MD   500 mg at 12/18/21 6962  ? albuterol (PROVENTIL) (2.5 MG/3ML) 0.083% nebulizer solution 2.5 mg  2.5 mg Nebulization Q6H PRN Para Skeans, MD      ? apixaban Arne [place]) tablet 2.5 mg  2.5 mg Oral BID Florina Ou V, MD   2.5 mg at 12/18/21 0730  ? atorvastatin (LIPITOR) tablet 20 mg  20 mg Oral Daily Para Skeans, MD   20 mg at 12/18/21 9528  ? cefTRIAXone (ROCEPHIN) 2 g in sodium chloride 0.9 % 100 mL IVPB  2 g Intravenous Q24H Antonieta Pert, MD   Stopped at 12/18/21 1130  ? doxycycline (VIBRAMYCIN) 100 mg in sodium chloride 0.9 % 250 mL IVPB  100 mg Intravenous Q12H Antonieta Pert, MD   Stopped at  12/18/21 1009  ? furosemide (LASIX) tablet 40 mg  40 mg Oral Daily Kc, Ramesh, MD      ? gabapentin (NEURONTIN) capsule 300 mg  300 mg Oral TID Para Skeans, MD   300 mg at 12/18/21 4132  ? methylPREDNISolone sodium succinate (SOLU-MEDROL) 40 mg/mL injection 40 mg  40 mg Intravenous Daily Kc, Ramesh, MD   40 mg at 12/18/21 1027  ? mometasone-formoterol (DULERA) 200-5 MCG/ACT inhaler 2 puff  2 puff Inhalation BID Para Skeans, MD   2 puff at 12/18/21 0729  ? rOPINIRole (REQUIP) tablet 4 mg  4 mg Oral QHS Para Skeans, MD   4 mg at 12/18/21 0031  ? sodium chloride flush (NS) 0.9 % injection 3 mL  3 mL Intravenous Q12H Para Skeans, MD   3 mL at 12/18/21 0733  ? tiotropium (  SPIRIVA) inhalation capsule ([facility] use ONLY) 18 mcg  18 mcg Inhalation Daily Para Skeans, MD   18 mcg at 12/18/21 0729  ? ?Current Outpatient Medications  ?Medication Sig Dispense Refill  ? acetaminophen (TYLENOL) 500 MG tablet Take 500 mg by mouth in the morning and at bedtime.    ? apixaban (ELIQUIS) 2.5 MG TABS tablet Take 1 tablet (2.5 mg total) by mouth 2 (two) times daily. 60 tablet 0  ? atorvastatin (LIPITOR) 20 MG tablet TAKE 1 TABLET BY MOUTH ONCE DAILY FOR CHOLESTEROL 30 tablet 11  ? Cholecalciferol (VITAMIN D) 2000 units tablet Take 2,000 Units by mouth daily.     ? empagliflozin (JARDIANCE) 10 MG TABS tablet Take 1 tablet (10 mg total) by mouth daily before breakfast. 14 tablet 0  ? furosemide (LASIX) 40 MG tablet Take 1 tablet (40 mg total) by mouth daily. 30 tablet 1  ? gabapentin (NEURONTIN) 300 MG capsule Take 1 capsule (300 mg total) by mouth 3 (three) times daily. 90 capsule 1  ? Melatonin 10 MG TABS Take 10 mg by mouth at bedtime.    ? Multiple Vitamin (MULTIVITAMIN WITH MINERALS) TABS tablet Take 1 tablet by mouth daily. 30 tablet 1  ? [START ON 12/23/2021] oxyCODONE (OXY IR/ROXICODONE) 5 MG immediate release tablet Take 2 tablets (10 mg total) by mouth 2 (two) times daily. May also take 1 tablet (5 mg total) at bedtime as  needed for severe pain. Must last 30 days.. 150 tablet 0  ? albuterol (PROVENTIL) (2.5 MG/3ML) 0.083% nebulizer solution USE 1 VIAL IN NEBULIZER EVERY 8 HOURS AS NEEDED FOR WHEEZING OR SHORTNESS OF BREATH. 75 mL

## 2021-12-18 NOTE — Hospital Course (Addendum)
88 y.o. female with medical history significant of chronic kidney disease, lung cancer currently being treated with radiation, paroxysmal atrial fibrillation on anticoagulation, chronic diastolic congestive heart failure, chronic respiratory failure with hypoxia reportedly on 3 L nasal cannula hypertension, restless leg syndrome, degenerative disc disease of the lumbar spine  Presented from home with fever, generalized weakness, lethargy and hypoxia with pulse ox 100% on 3 L nasal cannula  improved on 4l  ?In the ED Tmax 100.7, BP 90s to 100, still on 4 L subsequently placed on 5 L, labs showed creatinine 1.3 BNP 1454 with troponin 65> 63, initial lactate 1.9, wbc at 10.5 gm, UA WBC 0-5, blood culture urine cultures obtained.  Chest x-ray multifocal pulmonary infiltrate infectious or inflammatory, right suprahilar mass not well visualized, ankle x-ray soft tissue swelling, CT angio no PE, significant respiratory motion degradation, right upper lung mass less confluent, emphysema, diffuse groundglass density in the right upper lobe, interstitial and patchy groundglass densities in the bilateral lower lobes-likely superimposed pneumonia/infection.  CT head no acute finding. ?Patient given vancomycin, cefepime, Flagyl and admitted. ?CT angio done in 2016 showed aneurysmal dilatation of the ascending aorta at 4.3 cm, mediastinal right hilar lymphadenopathy with concerns for sarcoidosis. ?Of note recent PET scan 10/29/7562 showed hyperbolic metabolic right upper lobe pulmonary mass suspicious for primary bronchogenic malignancy, no other hypermetabolic disease in the neck chest abdomen or pelvis, pulmonary nodules up to 8 mm stable, ascending thoracic aortic aneurysm stable at 4.4 cm. ?She also has a wound on her left ankle and reportedly doing better ?

## 2021-12-18 NOTE — Consult Note (Signed)
WOC Nurse Consult Note: ?Patient receiving care in [facility] ED 32. ?Reason for Consult: left leg wound ?Wound type: partial thickness wound to left lateral malleolus. Unclear etiology at this time. ?Pressure Injury POA: Yes/No/NA ?Measurement: 3.5 cm x 3.5 cm ?Wound bed: moist, pink, scattered areas of re-pigmentation ?Drainage (amount, consistency, odor) drainage present on existing dressing ?Periwound: intact ?Dressing procedure/placement/frequency: ?Wash the left lateral ankle wound with soap and water, pat dry. Place a small piece of Aquacel Advantage Kellie Simmering [phone]) over the ankle wound area, top with dry 4 x 4 gauze, secure with a few turns of kerlix. Change daily. MOISTEN WITH SALINE TO REMOVE. ? ?Monitor the wound area(s) for worsening of condition such as: ?Signs/symptoms of infection,  ?Increase in size,  ?Development of or worsening of odor, ?Development of pain, or increased pain at the affected locations.  Notify the medical team if any of these develop. ? ?Thank you for the consult.  Discussed plan of care with the patient.  [place] nurse will not follow at this time.  Please re-consult the [place] team if needed. ? ?Val Riles, RN, MSN, CWOCN, CNS-BC, pager [phone]  ?  ?

## 2021-12-18 NOTE — Consult Note (Signed)
?[place] [facility] CARDIOLOGY CONSULT NOTE  ? ?    ?Patient ID: ?Kristin [name] ?MRN: [id number] ?DOB/AGE: 04/12/1930 88 y.o. ? ?Admit date: 12/17/2021 ?Referring Physician Dr. Antonieta Pert ?Primary Physician Dr. Parks Ranger ?Primary Cardiologist Dr. Saralyn Pilar ?Reason for Consultation diastolic HF ? ?HPI: Kristin [name] is a 88 year old female with a past medical history of right small cell lung cancer on recently starting palliative radiation therapy 12/08/2021, paroxysmal atrial fibrillation on Eliquis, sick sinus syndrome s/p DC PPM 12/2015, HFpEF (LVEF 60-65%, G1 DD 05/2020), CKD 3, peripheral arterial disease s/p stenting 08/2021, chronic respiratory failure/COPD on baseline 2-3L, GERD, chronic leg pain on gabapentin who presented to [facility] ED from home on 12/17/2021 with fever, generalized weakness and hypoxia.  Cardiology is consulted for assistance with her diastolic heart failure. ? ?Patient presents with her son who helps contribute to the history.She was recently seen by [name] the heart failure clinic 4 days ago where her weight had decreased 7 pounds from prior visit.  She received 80 mg of IV Lasix and potassium supplementation outpatient on 4/20.  She was started on Jardiance 10 mg daily and continued on her 40 mg Lasix once daily.  Her son states that yesterday was a pretty good day, she had radiation therapy in the morning, had a home care nurse come by in addition to physical therapy and she did okay, but later in the afternoon her son states she was "a different person, acting crazy" around 4 PM yesterday.  The patient states she has been short of breath for some time with a chronic cough over the past month and states her leg swelling has worsened past few days.  She currently says she feels "feels good" but is still requiring increased oxygen above her baseline.  Denies chest pain, palpitations, fever or chills although most recent temperatures 102 ?F. ? ?Labs are notable for the potassium 4.4,  BUN/creatinine 32/1.28, GFR 40 (baseline from 4/24 was 1.16, 45) AST ALT elevated at 163/83 respectively.  BNP 1454, high-sensitivity troponin 65-63.  Lactate uptrending 1.9-2.8, WBCs normal at 9.4, H&H 11.2/36.9. ?Chest x-ray with stable cardiomegaly and subtle groundglass opacities within the right upper lobe suspicious for pneumonia superimposed on chronic interstitial lung disease.  CT angiogram negative for acute pulmonary embolus increased size of previously noted left upper pulmonary nodule suggesting potential metastatic focus with right upper lobe lung mass that appears less confluent without discretely measurable mass. ? ?Review of systems complete and found to be negative unless listed above  ? ?Past Medical History:  ?Diagnosis Date  ? Acute postoperative pain 02/09/2018  ? Arrhythmia, sinus node 04/03/2014  ? Arthritis   ? Arthritis   ? Atrial fibrillation ([place])   ? Back pain   ? lower back chronic  ? Bradycardia   ? Breast cancer ([place]) 2004  ? right breast cancer  ? Cancer [facility]) 2004  ? rt breast cancer-post lumpectomy- chemo/rad  ? CHF (congestive heart failure) ([place])   ? Chronic back pain   ? CKD (chronic kidney disease)   ? COPD (chronic obstructive pulmonary disease) ([place])   ? wears O2 at 2L via [place] at night  ? COVID-19 06/11/2020  ? Cystocele   ? Decubitus ulcers   ? Dehydration   ? Gout   ? Hematuria   ? Hepatitis C   ? Hyperlipidemia   ? Hypertension   ? Hypomagnesemia 06/25/2015  ? NSTEMI (non-ST elevated myocardial infarction) ([place]) 04/08/2021  ? OAB (overactive bladder)   ? Overactive bladder   ?  Overdose opiate, accidental or unintentional, sequela 10/02/2020  ? Restless leg   ? Shoulder pain, bilateral   ? Urinary frequency   ? UTI (lower urinary tract infection)   ? Vaginal atrophy   ?  ?Past Surgical History:  ?Procedure Laterality Date  ? ABDOMINAL HYSTERECTOMY    ? BACK SURGERY    ? BREAST LUMPECTOMY Right 2004  ? BREAST SURGERY    ? rt lumpectomy  ? CARPAL TUNNEL RELEASE    ? CATARACT  EXTRACTION W/PHACO  10/19/2011  ? Procedure: CATARACT EXTRACTION PHACO AND INTRAOCULAR LENS PLACEMENT (IOC);  Surgeon: Elta Guadeloupe T. Gershon Crane, MD;  Location: [facility];  Service: Ophthalmology;  Laterality: Right;  CDE:10.81  ? CATARACT EXTRACTION W/PHACO  11/02/2011  ? Procedure: CATARACT EXTRACTION PHACO AND INTRAOCULAR LENS PLACEMENT (IOC);  Surgeon: Elta Guadeloupe T. Gershon Crane, MD;  Location: [facility];  Service: Ophthalmology;  Laterality: Left;  CDE 8.60  ? CHOLECYSTECTOMY    ? 3/18  ? COLON SURGERY    ? INSERT / REPLACE / REMOVE PACEMAKER    ? JOINT REPLACEMENT    ? bilateral TKA  ? LOWER EXTREMITY ANGIOGRAPHY Left 10/14/2020  ? Procedure: LOWER EXTREMITY ANGIOGRAPHY;  Surgeon: Katha Cabal, MD;  Location: [place] CV LAB;  Service: Cardiovascular;  Laterality: Left;  ? LOWER EXTREMITY ANGIOGRAPHY Right 09/08/2021  ? Procedure: LOWER EXTREMITY ANGIOGRAPHY;  Surgeon: Katha Cabal, MD;  Location: [place] CV LAB;  Service: Cardiovascular;  Laterality: Right;  ? PACEMAKER INSERTION N/A 12/24/2015  ? Procedure: INSERTION PACEMAKER;  Surgeon: Isaias Cowman, MD;  Location: [facility] ORS;  Service: Cardiovascular;  Laterality: N/A;  ? ROTATOR CUFF REPAIR    ? bilateral  ? VIDEO BRONCHOSCOPY WITH ENDOBRONCHIAL NAVIGATION Right 11/11/2021  ? Procedure: VIDEO BRONCHOSCOPY WITH ENDOBRONCHIAL NAVIGATION;  Surgeon: Ottie Glazier, MD;  Location: [facility] ORS;  Service: Thoracic;  Laterality: Right;  ?  ?(Not in a hospital admission) ? ?Social History  ? ?Socioeconomic History  ? Marital status: Single  ?  Spouse name: Not on file  ? Number of children: 2  ? Years of education: 19  ? Highest education level: 12th grade  ?Occupational History  ? Occupation: retired  ?Tobacco Use  ? Smoking status: Former  ?  Packs/day: 1.25  ?  Years: 40.00  ?  Pack years: 50.00  ?  Types: Cigarettes  ?  Quit date: 12/16/1998  ?  Years since quitting: 23.0  ? Smokeless tobacco: Never  ?Vaping Use  ? Vaping Use: Never used  ?Substance and Sexual Activity   ? Alcohol use: No  ?  Alcohol/week: 0.0 standard drinks  ? Drug use: Yes  ?  Types: Oxycodone  ? Sexual activity: Not Currently  ?  Birth control/protection: Surgical  ?Other Topics Concern  ? Not on file  ?Social History Narrative  ? Not on file  ? ?Social Determinants of Health  ? ?Financial Resource Strain: Low Risk   ? Difficulty of Paying Living Expenses: Not hard at all  ?Food Insecurity: No Food Insecurity  ? Worried About Charity fundraiser in the Last Year: Never true  ? Ran Out of Food in the Last Year: Never true  ?Transportation Needs: No Transportation Needs  ? Lack of Transportation (Medical): No  ? Lack of Transportation (Non-Medical): No  ?Physical Activity: Inactive  ? Days of Exercise per Week: 0 days  ? Minutes of Exercise per Session: 0 min  ?Stress: No Stress Concern Present  ? Feeling of Stress : Not at  all  ?Social Connections: Moderately Isolated  ? Frequency of Communication with Friends and Family: More than three times a week  ? Frequency of Social Gatherings with Friends and Family: More than three times a week  ? Attends Religious Services: More than 4 times per year  ? Active Member of Clubs or Organizations: No  ? Attends Archivist Meetings: Never  ? Marital Status: Never married  ?Intimate Partner Violence: Not At Risk  ? Fear of Current or Ex-Partner: No  ? Emotionally Abused: No  ? Physically Abused: No  ? Sexually Abused: No  ?  ?Family History  ?Problem Relation Age of Onset  ? Kidney disease Brother   ? Heart disease Father   ? Heart disease Mother   ? Stroke Mother   ? Cancer Sister   ? Breast cancer Sister 40  ? Breast cancer Paternal Aunt   ? Anesthesia problems Neg Hx   ? Hypotension Neg Hx   ? Malignant hyperthermia Neg Hx   ? Pseudochol deficiency Neg Hx   ?   ? ?PHYSICAL EXAM ?General: Elderly and ill-appearing black female, well nourished, in no acute distress.  Sitting at incline in hospital bed, son at bedside. ?HEENT:  Normocephalic and atraumatic. ?Neck:   No JVD.  ?Lungs: Normal respiratory effort on 5 L by nasal cannula.  Coarse breath sounds throughout, trace wheezes ?Heart: A paced at 60 bpm. Normal S1 and S2 without gallops or murmurs.  ?Abdomen: Non-distended app

## 2021-12-18 NOTE — Progress Notes (Signed)
?PROGRESS NOTE ?Kristin [name]  EXB:[id number] DOB: 03/28/1930 DOA: 12/17/2021 ?PCP: Olin Hauser, DO  ? ?Brief Narrative/Hospital Course: ?88 y.o. female with medical history significant of chronic kidney disease, lung cancer currently being treated with radiation, paroxysmal atrial fibrillation on anticoagulation, chronic diastolic congestive heart failure, chronic respiratory failure with hypoxia reportedly on 3 L nasal cannula hypertension, restless leg syndrome, degenerative disc disease of the lumbar spine  Presented from home with fever, generalized weakness, lethargy and hypoxia with pulse ox 100% on 3 L nasal cannula  improved on 4l  ?In the ED Tmax 100.7, BP 90s to 100, still on 4 L subsequently placed on 5 L, labs showed creatinine 1.3 BNP 1454 with troponin 65> 63, initial lactate 1.9, wbc at 10.5 gm, UA WBC 0-5, blood culture urine cultures obtained.  Chest x-ray multifocal pulmonary infiltrate infectious or inflammatory, right suprahilar mass not well visualized, ankle x-ray soft tissue swelling, CT angio no PE, significant respiratory motion degradation, right upper lung mass less confluent, emphysema, diffuse groundglass density in the right upper lobe, interstitial and patchy groundglass densities in the bilateral lower lobes-likely superimposed pneumonia/infection.  CT head no acute finding. ?Patient given vancomycin, cefepime, Flagyl and admitted. ?CT angio done in 2016 showed aneurysmal dilatation of the ascending aorta at 4.3 cm, mediastinal right hilar lymphadenopathy with concerns for sarcoidosis. ?Of note recent PET scan 1/0/2725 showed hyperbolic metabolic right upper lobe pulmonary mass suspicious for primary bronchogenic malignancy, no other hypermetabolic disease in the neck chest abdomen or pelvis, pulmonary nodules up to 8 mm stable, ascending thoracic aortic aneurysm stable at 4.4 cm. ?She also has a wound on her left ankle and reportedly doing better ?  ?Subjective: ?Seen  examined ?On 5l [place] spo2 at 99-weaned to 4l spot 93-95% ?Febrile 100.7 in the ED on presentation this morning 100.1 ?SBP  in100, 94% on 5 L ?Wheezing on expiration,leg edematous ?Son reports she has been having lots of coughing from lung cancer- has 2 more doses left due today. ? ?Assessment and Plan: ?Principal Problem: ?  AMS (altered mental status) ?Active Problems: ?  Sepsis ([place]) ?  Acute on chronic diastolic CHF (congestive heart failure) ([place]) ?  Chronic anticoagulation ([place]) ?  Paroxysmal atrial fibrillation ([facility]) ?  GERD (gastroesophageal reflux disease) ?  Chronic kidney disease (CKD), stage III (moderate) ([facility]) ?  Malignant neoplasm of right upper lobe of lung ([place]) ?  Hypertension ?  ?Sepsis POA ?Pneumonia RUL ?Acute on chronic hypoxic respiratory failure: ?On 2-3 L [place] at home currently needing 4 to 5 L.Presented with respiratory symptoms, cough, fever and hypoxia. Chest x-ray imaging shows emphysema chronic lung finding, groundglass changes within the right upper lobe- suspicious for infectious etiology-pneumonia, ?  Due to radiation.  Continue with IV antibiotics, supplemental oxygen, bronchodilators ?Recent Labs  ?Lab 12/17/21 ?2104 12/17/21 ?2113 12/18/21 ?3664  ?WBC  --  8.7 9.4  ?LATICACIDVEN 1.9  --  2.8*  ? ?Acute COPD exacerbation: Patient having active wheezing, needing more oxygen.  Continue on telemetry, add IV Solu-Medrol, continue antibiotics, Dulera Spiriva and neb ?  ?Lethargy/generalized weakness: Currently alert awake oriented x3, appears weak. ? ?Mild lactic acidosis 2.8: Trend ? ?Elevated troponin flat without delta 63>65, likely demand ischemia from pneumonia sepsis hypoxia.  Cardiology consulted ? ?Acute on chronic diastolic CHF, bnp up 4034( month ago)>1454, with leg edema.  Give her Lasix x 1 await further cardiology recommendation, cardiology consulted. Normally takes 40 Lasix at home daily. Monitor I/o, daily wt. ? ?Right upper  lobe malignant neoplasm had recent PET scan,  followed by radiation oncology and XRT has 2 more sesstons.  We will notify the oncology team ? ?Anemia of chronic disease hemoglobin 10 to 11 g ? ?Mild thrombocytopenia at 122 this morning ? ?PAF: Rate controlled continue on Eliquis. ? ?GERD: Continue PPI ? ?CKD 3B baseline creatinine around 1.2: At baseline. ? ?Essential hypertension: BP is stable ? ?Left leg wound followed at wound care.Continue wound care consult ? ?GOC: We discussed, she wishes to be full code, encouraged patient's son and patient to discuss further given her old age, lung cancer and other multiple comorbidities.  He request palliative care consult. ? ?DVT prophylaxis: apixaban (ELIQUIS) tablet 2.5 mg Start: 12/18/21 0245 ?Code Status:   Code Status: Full Code ?Family Communication: plan of care discussed with patient/son on phone ? ?Patient status is: observation, but will need 2 midnight stay at least for ongoing management of respiratory failure pneumonia level of care: Telemetry Cardiac  ? ?Patient currently not stable ? ?Dispo: The patient is from: home ?           Anticipated disposition: home ? ?Mobility Assessment (last 72 hours)   ? ? Mobility Assessment   ?No documentation. ? ?  ?  ? ?  ?  ? ?Objective: ?Vitals last 24 hrs: ?Vitals:  ? 12/18/21 0105 12/18/21 0621 12/18/21 0800 12/18/21 0830  ?BP: (!) 100/57 (!) 116/59 126/86   ?Pulse: 67 66 75 71  ?Resp: (!) 22 (!) 21 20 19   ?Temp:  100.1 ?F (37.8 ?C)  (!) 102.3 ?F (39.1 ?C)  ?TempSrc:  Oral Oral Oral  ?SpO2: 92% 94%  92%  ?Weight:      ? ?Weight change:  ? ?Physical Examination: ?General exam: AA OX3, weak, elderly,older than stated age, weak appearing. ?HEENT:Oral mucosa moist, Ear/Nose WNL grossly, dentition normal. ?Respiratory system: bilaterally diminished BS with expiratory wheezing diffusely, no use of accessory muscle ?Cardiovascular system: S1 & S2 +, No JVD,. ?Gastrointestinal system: Abdomen soft,NT,ND, BS+ ?Nervous System:Alert, awake, moving extremities and grossly  nonfocal ?Extremities: LE edema mild,distal peripheral pulses palpable.  ?Skin: No rashes,no icterus. ?MSK: Normal muscle bulk,tone, power ? ?Medications reviewed:  ?Scheduled Meds: ? apixaban  2.5 mg Oral BID  ? atorvastatin  20 mg Oral Daily  ? gabapentin  300 mg Oral TID  ? methylPREDNISolone (SOLU-MEDROL) injection  40 mg Intravenous Daily  ? mometasone-formoterol  2 puff Inhalation BID  ? rOPINIRole  4 mg Oral QHS  ? sodium chloride flush  3 mL Intravenous Q12H  ? tiotropium  18 mcg Inhalation Daily  ? ?Continuous Infusions: ? sodium chloride 20 mL/hr (12/18/21 0231)  ? cefTRIAXone (ROCEPHIN)  IV 2 g (12/18/21 0839)  ? doxycycline (VIBRAMYCIN) IV 100 mg (12/18/21 0845)  ? ? ?  ?Diet Order   ? ?       ?  Diet Heart Room service appropriate? Yes; Fluid consistency: Thin  Diet effective now       ?  ? ?  ?  ? ?  ?  ? ?  ?  ?  ? ? ?Intake/Output Summary (Last 24 hours) at 12/18/2021 0910 ?Last data filed at 12/18/2021 9476 ?Gross per 24 hour  ?Intake 446.77 ml  ?Output --  ?Net 446.77 ml  ? ?Net IO Since Admission: 446.77 mL [12/18/21 0910]  ?Wt Readings from Last 3 Encounters:  ?12/17/21 76.9 kg  ?12/16/21 71.7 kg  ?12/14/21 73 kg  ?  ? ?Unresulted Labs (From admission, onward)  ? ?  Start     Ordered  ? 12/19/21 0500  Procalcitonin  Daily,   R     ? 12/18/21 0808  ? 12/19/21 8832  Basic metabolic panel  Tomorrow morning,   R       ? 12/18/21 0814  ? 12/19/21 0500  CBC  Tomorrow morning,   R       ? 12/18/21 0814  ? 12/19/21 0500  Brain natriuretic peptide  Tomorrow morning,   R       ? 12/18/21 0814  ? 12/18/21 0815  Legionella Pneumophila Serogp 1 Ur Ag  Once,   R       ? 12/18/21 0814  ? 12/18/21 0815  Strep pneumoniae urinary antigen  Once,   R       ? 12/18/21 0814  ? 12/18/21 0815  Expectorated Sputum Assessment w Gram Stain, Rflx to Resp Cult  Once,   R       ? 12/18/21 0814  ? 12/18/21 0809  Procalcitonin - Baseline  Add-on,   AD       ?Comments: Can add on to earlier sample if possible  ? 12/18/21 0808  ?  12/17/21 2116  MRSA Next Gen by PCR, Nasal  (MRSA Screening)  ONCE - URGENT,   URGENT       ? 12/17/21 2115  ? 12/17/21 2110  Urine Culture  (Septic presentation on arrival (screening labs, nursing and treatment o

## 2021-12-18 NOTE — Evaluation (Addendum)
Clinical/Bedside Swallow Evaluation ?Patient Details  ?Name: Kristin [name] ?MRN: [id number] ?Date of Birth: July 19, 1930 ? ?Today's Date: 12/18/2021 ?Time: SLP Start Time (ACUTE ONLY): 0945 SLP Stop Time (ACUTE ONLY): 1045 ?SLP Time Calculation (min) (ACUTE ONLY): 60 min ? ?Past Medical History:  ?Past Medical History:  ?Diagnosis Date  ? Acute postoperative pain 02/09/2018  ? Arrhythmia, sinus node 04/03/2014  ? Arthritis   ? Arthritis   ? Atrial fibrillation ([place])   ? Back pain   ? lower back chronic  ? Bradycardia   ? Breast cancer ([place]) 2004  ? right breast cancer  ? Cancer [facility]) 2004  ? rt breast cancer-post lumpectomy- chemo/rad  ? CHF (congestive heart failure) ([place])   ? Chronic back pain   ? CKD (chronic kidney disease)   ? COPD (chronic obstructive pulmonary disease) ([place])   ? wears O2 at 2L via [place] at night  ? COVID-19 06/11/2020  ? Cystocele   ? Decubitus ulcers   ? Dehydration   ? Gout   ? Hematuria   ? Hepatitis C   ? Hyperlipidemia   ? Hypertension   ? Hypomagnesemia 06/25/2015  ? NSTEMI (non-ST elevated myocardial infarction) ([place]) 04/08/2021  ? OAB (overactive bladder)   ? Overactive bladder   ? Overdose opiate, accidental or unintentional, sequela 10/02/2020  ? Restless leg   ? Shoulder pain, bilateral   ? Urinary frequency   ? UTI (lower urinary tract infection)   ? Vaginal atrophy   ? ?Past Surgical History:  ?Past Surgical History:  ?Procedure Laterality Date  ? ABDOMINAL HYSTERECTOMY    ? BACK SURGERY    ? BREAST LUMPECTOMY Right 2004  ? BREAST SURGERY    ? rt lumpectomy  ? CARPAL TUNNEL RELEASE    ? CATARACT EXTRACTION W/PHACO  10/19/2011  ? Procedure: CATARACT EXTRACTION PHACO AND INTRAOCULAR LENS PLACEMENT (IOC);  Surgeon: Elta Guadeloupe T. Gershon Crane, MD;  [facility]: [facility];  Service: Ophthalmology;  Laterality: Right;  CDE:10.81  ? CATARACT EXTRACTION W/PHACO  11/02/2011  ? Procedure: CATARACT EXTRACTION PHACO AND INTRAOCULAR LENS PLACEMENT (IOC);  Surgeon: Elta Guadeloupe T. Gershon Crane, MD;  Location: [facility];  Service:  Ophthalmology;  Laterality: Left;  CDE 8.60  ? CHOLECYSTECTOMY    ? 3/18  ? COLON SURGERY    ? INSERT / REPLACE / REMOVE PACEMAKER    ? JOINT REPLACEMENT    ? bilateral TKA  ? LOWER EXTREMITY ANGIOGRAPHY Left 10/14/2020  ? Procedure: LOWER EXTREMITY ANGIOGRAPHY;  Surgeon: Katha Cabal, MD;  Location: [place] [facility];  Service: Cardiovascular;  Laterality: Left;  ? LOWER EXTREMITY ANGIOGRAPHY Right 09/08/2021  ? Procedure: LOWER EXTREMITY ANGIOGRAPHY;  Surgeon: Katha Cabal, MD;  Location: [place] [facility];  Service: Cardiovascular;  Laterality: Right;  ? PACEMAKER INSERTION N/A 12/24/2015  ? Procedure: INSERTION PACEMAKER;  Surgeon: Isaias Cowman, MD;  Location: [facility] ORS;  Service: Cardiovascular;  Laterality: N/A;  ? ROTATOR CUFF REPAIR    ? bilateral  ? VIDEO BRONCHOSCOPY WITH ENDOBRONCHIAL NAVIGATION Right 11/11/2021  ? Procedure: VIDEO BRONCHOSCOPY WITH ENDOBRONCHIAL NAVIGATION;  Surgeon: Ottie Glazier, MD;  Location: [facility] ORS;  Service: Thoracic;  Laterality: Right;  ? ?HPI:  ?Pt is a 88 y.o. female with medical history significant of chronic kidney disease, lung cancer currently being treated with radiation, COPD, paroxysmal atrial fibrillation on anticoagulation, chronic diastolic congestive heart failure, hypertension, restless leg syndrome, degenerative disc disease of the lumbar spine brought to the emergency room today for altered mental status lethargy concerns  of fever by EMS.  Patient was given Tylenol and she does report increasing in the cough at baseline she is on 3 L oxygen and satting 74%.  Patient is oriented but very lethargic and sleepy.  Patient has a wound on her left ankle that she is reporting is doing better per the wound doctor.  Patient on arrival to the ED does meet sepsis criteria with fever respiratory rate source of possible infection, patient's lactic acid was right at the border at 1.9.  Normal white count.  Respiratory panel is negative for influenza and  COVID.    Per PET scan Imaging 09/27/2351, "Hypermetabolic right upper lobe pulmonary mass is suspicious for  primary bronchogenic malignancy.     2. Minimally metabolic mildly enlarged/prominent mediastinal and  right hilar lymph nodes have been present dating back to at least  2016 and while technically nonspecific given stability are not  favored to reflect nodal disease involvement.     3. Otherwise, no convincing evidence of hypermetabolic metastatic  disease in the neck, chest, abdomen or pelvis.     4. The additional pulmonary nodules measuring up to 8 mm are stable  from prior chest CTs, none of which demonstrate abnormal FDG avidity  although some of which are below the resolution of PET-CT.     5. Similar size of the ascending thoracic aortic aneurysm measuring  4.4 cm. Recommend annual imaging followup by CTA or MRA.".  ?  ?Assessment / Plan / Recommendation  ?Clinical Impression ?  Pt appears to present w/ grossly adequate oropharyngeal phase swallow function w/ No immediate, overt clinical s/s of aspiration noted; No neurological impact on swallow function. O2 sats remained 96%. However, pt does appear impacted by her chronic conditions of COPD, CHF, and Pulmonary decline from R lobe lung Ca w/ need for increased O2 support from her baseline(now on 5L). ANY significant Pulmonary decline can impact Apnea timing during the swallow which can increase risk of airway protection, and increase risk for aspiration to occur.  ?Pt is also missing her lower Denture plate -- usually does not wear per her report. Lacking sufficient Dentition can impact effective mastication of solids. Pt is also 88yo. ? ?Pt was alert/awake; she followed all instructions feeding self po trials given setup support. Noted mildly increased WOB w/ exertion including talking. Rest Breaks were discussed and encouraged for conservation of energy. Pt consumed po trials of thin liquids via Cup and purees/soft solids w/ no immediate, overt  clinical s/s of aspiration noted; no wet vocal quality b/t trials and laryngeal excursion appeared wfl. Pt's respiratory effort did appear to increase minimally w/ effort during mastication of solids and drinking quickly w/out break but calmed again when Rest Breaks were implemented b/t trials. Swallowing of po's was timely. Pt was encouraged to drink slowly w/ Single sips. Oral phase adequate for bolus management of the trials; timely A-P transfer and full oral clearing achieved post swallows. Min increased time needed for mastication of soft solids d/t lacking bottom Dentition; moistened foods well. Pt cleared orally w/ Time. OM exam WFL; no unilateral weakness. Pt fed self.  ? ?Discussed that foods of increased texture would require more oral phase time, mastication effort, and increased respiratory effort. Education given on soft food consistencies/options and need for cohesive foods. Educated pt on general aspiration precautions d/t Pulmonary status - the need for frequent Rest Breaks during oral intake.  ? ?Recommend a more Mech Soft diet w/ moistened foods for easier mastication to lessen  WOB during oral phase effort. Recommend thin liquids via Cup -- single sips, slowly; general aspiration precautions. Pt has been manageing Pills given w/ water per NSG but recommended to swallow Whole in Puree IF needed for ease. Recommend tray setup at meals, positioning. ST services can be available if any new needs arise while admitted. NSG updated. Pt agreed.  Recommend Palliative Care f/u for ongoing discussion of [place]; support. ?SLP Visit Diagnosis: Dysphagia, unspecified (R13.10) (easily SOB w/ WOB w/ any exertion at baseline) ?   ?Aspiration Risk ? Mild aspiration risk;Risk for inadequate nutrition/hydration (reduced following general aspiration precautions)  ?  ?Diet Recommendation   Mech Soft diet w/ moistened foods for easier mastication to lessen WOB during oral phase effort. Recommend thin liquids via Cup --  single sips, slowly; general aspiration precautions. Recommend tray setup at meals, positioning.  ? ?Medication Administration: Whole meds with puree (IF ANY difficulty when swallowing w/ liquids.)  ?  ?Other  Recommendati

## 2021-12-18 NOTE — Consult Note (Signed)
[place] ?CONSULT NOTE ? ?Patient Care Team: ?Olin Hauser, DO as PCP - General (Family Medicine) ?Olin Hauser, DO as Consulting Physician (Family Medicine) ?Isaias Cowman, MD as Consulting Physician (Cardiology) ?Milinda Pointer, MD as Referring Physician (Pain Medicine) ?Alisa Graff, FNP as Nurse Practitioner (Family Medicine) ?Florance, Tomasa Blase, RN as [place] Management ?Telford Nab, RN as Sales executive ?Vanita Ingles, RN as Case Manager ?Rebekah [place], LCSW as Education officer, museum (Licensed Holiday representative) ? ?CHIEF COMPLAINTS/PURPOSE OF CONSULTATION:  ?Lung cancer ? ?HISTORY OF PRESENTING ILLNESS:  ?Kristin [name] 88 y.o.  female  ?of smoking with multiple medical problems including COPD/CHF/remote history of breast cancer-with a newly diagnosed limited stage small cell lung cancer is currently admitted to the hospital for worsening shortness of breath fever cough mental status changes. ? ?Patient is currently getting radiation her limited stage lung cancer.  Patient is not a candidate for chemoradiation.  ? ?In the emergency room patient is CT scan that was negative for PE; however showed the right lung mass, but also concern for increased size of the left upper lobe lung nodule.  The CT scan showed bilateral groundglass opacities also-concerning for pneumonic process.  Patient is currently on 4-5 L of oxygen.  Saturating 92%.  CT scan done negative for any acute process. ? ?Patient currently at her baseline mentation.  Having her lunch.  Eager to go home. ? ?Review of Systems  ?Constitutional:  Positive for malaise/fatigue and weight loss. Negative for chills, diaphoresis and fever.  ?HENT:  Negative for nosebleeds and sore throat.   ?Eyes:  Negative for double vision.  ?Respiratory:  Positive for cough and shortness of breath. Negative for hemoptysis, sputum production and wheezing.   ?Cardiovascular:   Negative for chest pain, palpitations, orthopnea and leg swelling.  ?Gastrointestinal:  Negative for abdominal pain, blood in stool, constipation, diarrhea, heartburn, melena, nausea and vomiting.  ?Genitourinary:  Negative for dysuria, frequency and urgency.  ?Musculoskeletal:  Positive for back pain and joint pain.  ?Skin: Negative.  Negative for itching and rash.  ?Neurological:  Negative for dizziness, tingling, focal weakness, weakness and headaches.  ?Endo/Heme/Allergies:  Does not bruise/bleed easily.  ?Psychiatric/Behavioral:  Negative for depression. The patient is not nervous/anxious and does not have insomnia.    ? ?MEDICAL HISTORY:  ?Past Medical History:  ?Diagnosis Date  ? Acute postoperative pain 02/09/2018  ? Arrhythmia, sinus node 04/03/2014  ? Arthritis   ? Arthritis   ? Atrial fibrillation ([place])   ? Back pain   ? lower back chronic  ? Bradycardia   ? Breast cancer ([place]) 2004  ? right breast cancer  ? Cancer [facility]) 2004  ? rt breast cancer-post lumpectomy- chemo/rad  ? CHF (congestive heart failure) ([place])   ? Chronic back pain   ? CKD (chronic kidney disease)   ? COPD (chronic obstructive pulmonary disease) ([place])   ? wears O2 at 2L via [place] at night  ? COVID-19 06/11/2020  ? Cystocele   ? Decubitus ulcers   ? Dehydration   ? Gout   ? Hematuria   ? Hepatitis C   ? Hyperlipidemia   ? Hypertension   ? Hypomagnesemia 06/25/2015  ? NSTEMI (non-ST elevated myocardial infarction) ([place]) 04/08/2021  ? OAB (overactive bladder)   ? Overactive bladder   ? Overdose opiate, accidental or unintentional, sequela 10/02/2020  ? Restless leg   ? Shoulder pain, bilateral   ? Urinary frequency   ?  UTI (lower urinary tract infection)   ? Vaginal atrophy   ? ? ?SURGICAL HISTORY: ?Past Surgical History:  ?Procedure Laterality Date  ? ABDOMINAL HYSTERECTOMY    ? BACK SURGERY    ? BREAST LUMPECTOMY Right 2004  ? BREAST SURGERY    ? rt lumpectomy  ? CARPAL TUNNEL RELEASE    ? CATARACT EXTRACTION W/PHACO  10/19/2011  ? Procedure:  CATARACT EXTRACTION PHACO AND INTRAOCULAR LENS PLACEMENT (IOC);  Surgeon: Elta Guadeloupe T. Gershon Crane, MD;  [facility]: [facility];  Service: Ophthalmology;  Laterality: Right;  CDE:10.81  ? CATARACT EXTRACTION W/PHACO  11/02/2011  ? Procedure: CATARACT EXTRACTION PHACO AND INTRAOCULAR LENS PLACEMENT (IOC);  Surgeon: Elta Guadeloupe T. Gershon Crane, MD;  Location: [facility];  Service: Ophthalmology;  Laterality: Left;  CDE 8.60  ? CHOLECYSTECTOMY    ? 3/18  ? COLON SURGERY    ? INSERT / REPLACE / REMOVE PACEMAKER    ? JOINT REPLACEMENT    ? bilateral TKA  ? LOWER EXTREMITY ANGIOGRAPHY Left 10/14/2020  ? Procedure: LOWER EXTREMITY ANGIOGRAPHY;  Surgeon: Katha Cabal, MD;  Location: [place] [facility];  Service: Cardiovascular;  Laterality: Left;  ? LOWER EXTREMITY ANGIOGRAPHY Right 09/08/2021  ? Procedure: LOWER EXTREMITY ANGIOGRAPHY;  Surgeon: Katha Cabal, MD;  Location: [place] [facility];  Service: Cardiovascular;  Laterality: Right;  ? PACEMAKER INSERTION N/A 12/24/2015  ? Procedure: INSERTION PACEMAKER;  Surgeon: Isaias Cowman, MD;  Location: [facility];  Service: Cardiovascular;  Laterality: N/A;  ? ROTATOR CUFF REPAIR    ? bilateral  ? VIDEO BRONCHOSCOPY WITH ENDOBRONCHIAL NAVIGATION Right 11/11/2021  ? Procedure: VIDEO BRONCHOSCOPY WITH ENDOBRONCHIAL NAVIGATION;  Surgeon: Ottie Glazier, MD;  Location: [facility] ORS;  Service: Thoracic;  Laterality: Right;  ? ? ?SOCIAL HISTORY: ?Social History  ? ?Socioeconomic History  ? Marital status: Single  ?  Spouse name: Not on file  ? Number of children: 2  ? Years of education: 53  ? Highest education level: 12th grade  ?Occupational History  ? Occupation: retired  ?Tobacco Use  ? Smoking status: Former  ?  Packs/day: 1.25  ?  Years: 40.00  ?  Pack years: 50.00  ?  Types: Cigarettes  ?  Quit date: 12/16/1998  ?  Years since quitting: 23.0  ? Smokeless tobacco: Never  ?Vaping Use  ? Vaping Use: Never used  ?Substance and Sexual Activity  ? Alcohol use: No  ?  Alcohol/week: 0.0 standard drinks   ? Drug use: Yes  ?  Types: Oxycodone  ? Sexual activity: Not Currently  ?  Birth control/protection: Surgical  ?Other Topics Concern  ? Not on file  ?Social History Narrative  ? Not on file  ? ?Social Determinants of Health  ? ?Financial Resource Strain: Low Risk   ? Difficulty of Paying Living Expenses: Not hard at all  ?Food Insecurity: No Food Insecurity  ? Worried About Charity fundraiser in the Last Year: Never true  ? Ran Out of Food in the Last Year: Never true  ?Transportation Needs: No Transportation Needs  ? Lack of Transportation (Medical): No  ? Lack of Transportation (Non-Medical): No  ?Physical Activity: Inactive  ? Days of Exercise per Week: 0 days  ? Minutes of Exercise per Session: 0 min  ?Stress: No Stress Concern Present  ? Feeling of Stress : Not at all  ?Social Connections: Moderately Isolated  ? Frequency of Communication with Friends and Family: More than three times a week  ? Frequency of Social Gatherings  with Friends and Family: More than three times a week  ? Attends Religious Services: More than 4 times per year  ? Active Member of Clubs or Organizations: No  ? Attends Archivist Meetings: Never  ? Marital Status: Never married  ?Intimate Partner Violence: Not At Risk  ? Fear of Current or Ex-Partner: No  ? Emotionally Abused: No  ? Physically Abused: No  ? Sexually Abused: No  ? ? ?FAMILY HISTORY: ?Family History  ?Problem Relation Age of Onset  ? Kidney disease Brother   ? Heart disease Father   ? Heart disease Mother   ? Stroke Mother   ? Cancer Sister   ? Breast cancer Sister 44  ? Breast cancer Paternal Aunt   ? Anesthesia problems Neg Hx   ? Hypotension Neg Hx   ? Malignant hyperthermia Neg Hx   ? Pseudochol deficiency Neg Hx   ? ? ?ALLERGIES:  is allergic to flexeril [cyclobenzaprine hcl] and vancomycin. ? ?MEDICATIONS:  ?Current Facility-Administered Medications  ?Medication Dose Route Frequency Provider Last Rate Last Admin  ? 0.9 %  sodium chloride infusion  20 mL/hr  Intravenous Continuous Para Skeans, MD 20 mL/hr at 12/18/21 0231 20 mL/hr at 12/18/21 0231  ? acetaminophen (TYLENOL) tablet 500 mg  500 mg Oral Q6H PRN Para Skeans, MD   500 mg at 12/18/21 2979  ? albut

## 2021-12-19 DIAGNOSIS — R400 Somnolence: Secondary | ICD-10-CM | POA: Diagnosis not present

## 2021-12-19 LAB — URINE CULTURE: Culture: NO GROWTH

## 2021-12-19 LAB — CBC
HCT: 30.7 % — ABNORMAL LOW (ref 36.0–46.0)
Hemoglobin: 9.5 g/dL — ABNORMAL LOW (ref 12.0–15.0)
MCH: 27.3 pg (ref 26.0–34.0)
MCHC: 30.9 g/dL (ref 30.0–36.0)
MCV: 88.2 fL (ref 80.0–100.0)
Platelets: 120 10*3/uL — ABNORMAL LOW (ref 150–400)
RBC: 3.48 MIL/uL — ABNORMAL LOW (ref 3.87–5.11)
RDW: 17.7 % — ABNORMAL HIGH (ref 11.5–15.5)
WBC: 7.8 10*3/uL (ref 4.0–10.5)
nRBC: 0.3 % — ABNORMAL HIGH (ref 0.0–0.2)

## 2021-12-19 LAB — BASIC METABOLIC PANEL
Anion gap: 8 (ref 5–15)
BUN: 32 mg/dL — ABNORMAL HIGH (ref 8–23)
CO2: 21 mmol/L — ABNORMAL LOW (ref 22–32)
Calcium: 8.6 mg/dL — ABNORMAL LOW (ref 8.9–10.3)
Chloride: 106 mmol/L (ref 98–111)
Creatinine, Ser: 1.18 mg/dL — ABNORMAL HIGH (ref 0.44–1.00)
GFR, Estimated: 44 mL/min — ABNORMAL LOW (ref >60)
Glucose, Bld: 106 mg/dL — ABNORMAL HIGH (ref 70–99)
Potassium: 4 mmol/L (ref 3.5–5.1)
Sodium: 135 mmol/L (ref 135–145)

## 2021-12-19 LAB — BLOOD GAS, VENOUS
Acid-base deficit: 1.1 mmol/L (ref 0.0–2.0)
Bicarbonate: 24.8 mmol/L (ref 20.0–28.0)
O2 Saturation: 24.7 %
Patient temperature: 37
pCO2, Ven: 45 mmHg (ref 44–60)
pH, Ven: 7.35 (ref 7.25–7.43)

## 2021-12-19 LAB — PROCALCITONIN: Procalcitonin: 2.3 ng/mL

## 2021-12-19 LAB — BRAIN NATRIURETIC PEPTIDE: B Natriuretic Peptide: 2011.3 pg/mL — ABNORMAL HIGH (ref 0.0–100.0)

## 2021-12-19 MED ORDER — DOXYCYCLINE HYCLATE 100 MG PO CAPS: 100.0000 mg | ORAL_CAPSULE | Freq: Two times a day (BID) | ORAL | 0 refills | Status: DC

## 2021-12-19 MED ORDER — AMOXICILLIN-POT CLAVULANATE 875-125 MG PO TABS: 1.0000 | ORAL_TABLET | Freq: Three times a day (TID) | ORAL | 0 refills | Status: DC

## 2021-12-19 MED ORDER — AMOXICILLIN-POT CLAVULANATE 875-125 MG PO TABS: 1.0000 | ORAL_TABLET | Freq: Two times a day (BID) | ORAL | 0 refills | Status: DC

## 2021-12-19 NOTE — TOC Transition Note (Signed)
Transition of Care (TOC) - CM/SW Discharge Note ? ? ?Patient Details  ?Name: Kristin [name] ?MRN: [id number] ?Date of Birth: 1930/05/01 ? ?Transition of Care (TOC) CM/SW Contact:  ?Izola Price, RN ?Phone Number: ?12/19/2021, 1:37 PM ? ? ?Clinical Narrative:  4/29: Patient has discharge orders and RN CM asked provider about [facility] therapy as patient has refused rehab. Provider says family has ongoing services with an agency and family assistance. Spoke with [name], granddaughter, and she said Alvis Lemmings was seeing patient just prior to admission. Contacted [facility] to confirm via [place]. Provider to write new [place] orders for PT/OT/RN. Tommi Rumps will have PT do a re-evaluation if needed since hospitalization. Family will transport patient home. Simmie Davies RN  ?  ? ? ? ?Final next level of care: [place] ?Barriers to Discharge: Barriers Resolved ? ? ?Patient Goals and CMS Choice ?  ?  ?  ? ?Discharge Placement ?  ?           ?  ?Patient to be transferred to facility by: Transport to home via family. ?  ?Patient and family notified of of transfer: 12/19/21 ? ?Discharge Plan and Services ?  ?  ?           ?DME Arranged: N/A ?  ?  ?  ?  ?HH Arranged: RN, OT, PT ?[place] Agency: [place] ?Date HH Agency Contacted: 12/19/21 ?Time [place]: 1336 ?Representative spoke with at [place]: Tommi Rumps ? ?Social Determinants of Health (SDOH) Interventions ?  ? ? ?Readmission Risk Interventions ? ?  11/06/2021  ?  4:09 PM 09/21/2021  ? 11:49 AM 05/25/2020  ? 12:32 PM  ?Readmission Risk Prevention Plan  ?Transportation Screening Complete Complete Complete  ?PCP or Specialist Appt within 3-5 Days  Complete Not Complete  ?Not Complete comments   Sunday discharge PCP not available  ?[place] or Home Care Consult  Complete Complete  ?Social Work Consult for [place] Planning/Counseling  Complete Complete  ?Palliative Care Screening  Not Applicable Not Applicable  ?Medication Review Press photographer) Complete Complete Complete   ?PCP or Specialist appointment within 3-5 days of discharge Complete    ?[place] or Home Care Consult Complete    ?SW Recovery Care/Counseling Consult Not Complete    ?SW Consult Not Complete Comments RNCM assigned to case    ?Palliative Care Screening Not Applicable    ?[place] Not Applicable    ? ? ? ? ? ?

## 2021-12-19 NOTE — Discharge Summary (Addendum)
Kristin [name] SJG:[id number] DOB: 1930/04/06 DOA: 12/17/2021 ? ?PCP: Olin Hauser, DO ? ?Admit date: 12/17/2021 ?Discharge date: 12/19/2021 ? ?Time spent: 45 minutes ? ?Recommendations for Outpatient Follow-up:  ?Oncology f/u  ? ? ? ?Discharge Diagnoses:  ?Principal Problem: ?  AMS (altered mental status) ?Active Problems: ?  Sepsis ([place]) ?  Acute on chronic diastolic CHF (congestive heart failure) ([place]) ?  Chronic anticoagulation ([place]) ?  Paroxysmal atrial fibrillation ([facility]) ?  GERD (gastroesophageal reflux disease) ?  Chronic kidney disease (CKD), stage III (moderate) ([facility]) ?  Malignant neoplasm of right upper lobe of lung ([place]) ?  Hypertension ?  Pneumonia ? ? ?Discharge Condition: stable ? ?Diet recommendation: heart healthy ? ?Filed Weights  ? 12/17/21 2053 12/19/21 0500  ?Weight: 76.9 kg 77.7 kg  ? ? ?History of present illness:  ?From admission h and p: ?Kristin [name] is a 88 y.o. female with medical history significant of chronic kidney disease, lung cancer currently being treated with radiation, paroxysmal atrial fibrillation on anticoagulation, chronic diastolic congestive heart failure, hypertension, restless leg syndrome, degenerative disc disease of the lumbar spine brought to the emergency room today for altered mental status lethargy concerns of fever by EMS.  Patient was given Tylenol and she does report increasing in the cough at baseline she is on 3 L oxygen and satting 74%.  Patient is oriented but very lethargic and sleepy.  Patient has a wound on her left ankle that she is reporting is doing better per the wound doctor.  Patient on arrival to the ED does meet sepsis criteria with fever respiratory rate source of possible infection, patient's lactic acid was right at the border at 1.9.  Normal white count.  Respiratory panel is negative for influenza and COVID.  CT angio done in 2016 showed aneurysmal dilatation of the ascending aorta at 4.3 cm, mediastinal right hilar lymphadenopathy  with concerns for sarcoidosis. ? ?Hospital Course:  ?Patient presented with cough, fever, and worsened o2 requirement (4-5L from home 2 L). CT no PE but shows known lung malignancy, with superimposed RUL suspicious for pneumonia. Started on ceftriaxone and deoxycycline. Last fever AM of 4/28. Blood and urine cultures ngtd. Covid/flu neg. Breathing comfortably on 4 L. Declines SNF, is adamant about going home. Palliative consulted, patient wants full scope of care. Cardiology consulted for mild fluid overload, was treated with IV diuresis. Lactic acidosis resolved. Patient requests discharge home, is reasonable given current stability though is at high risk for readmission and I was clear about this with her family. Will discharge to continue home O2, complete 7 day course doxycycline/augmentin (double coverage given recent receipt of broad spectrum abx), with close oncology and rad/onc f/u.  ? ?Procedures: ?none  ? ?Consultations: ?Cardiology, oncology ? ?Discharge Exam: ?Vitals:  ? 12/19/21 0352 12/19/21 0747  ?BP: (!) 98/57 (!) 103/54  ?Pulse: 60 61  ?Resp: 18 17  ?Temp: 99.1 ?F (37.3 ?C) 98.2 ?F (36.8 ?C)  ?SpO2: 91% 91%  ? ? ?General: NAD ?Cardiovascular: RRR, soft systolic murmur ?Respiratory: few scattered rhonchi, no wheeze, rales at bases ? ?Discharge Instructions ? ? ?Discharge Instructions   ? ? Diet - low sodium heart healthy   Complete by: As directed ?  ? Discharge wound care:   Complete by: As directed ?  ? Wash the left lateral ankle wound with soap and water, pat dry. Place a small piece of Aquacel Advantage over the ankle wound area, top with dry 4 x 4 gauze, secure with a  few turns of kerlix. Change daily. MOISTEN WITH SALINE TO REMOVE.  ? Increase activity slowly   Complete by: As directed ?  ? ?  ? ?Allergies as of 12/19/2021   ? ?   Reactions  ? Flexeril [cyclobenzaprine Hcl]   ? Severe Rash  ? Vancomycin Rash  ? Red Mans Syndrome  ? ?  ? ?  ?Medication List  ?  ? ?TAKE these medications    ? ?acetaminophen 500 MG tablet ?Commonly known as: TYLENOL ?Take 500 mg by mouth in the morning and at bedtime. ?  ?albuterol (2.5 MG/3ML) 0.083% nebulizer solution ?Commonly known as: PROVENTIL ?USE 1 VIAL IN NEBULIZER EVERY 8 HOURS AS NEEDED FOR WHEEZING OR SHORTNESS OF BREATH. ?  ?amoxicillin-clavulanate 875-125 MG tablet ?Commonly known as: Augmentin ?Take 1 tablet by mouth 3 (three) times daily for 5 days. ?  ?apixaban 2.5 MG Tabs tablet ?Commonly known as: ELIQUIS ?Take 1 tablet (2.5 mg total) by mouth 2 (two) times daily. ?  ?atorvastatin 20 MG tablet ?Commonly known as: LIPITOR ?TAKE 1 TABLET BY MOUTH ONCE DAILY FOR CHOLESTEROL ?  ?bisacodyl 5 MG EC tablet ?Commonly known as: DULCOLAX ?Take 2 tablets (10 mg total) by mouth daily as needed for moderate constipation. ?  ?Breztri Aerosphere 160-9-4.8 MCG/ACT Aero ?Generic drug: Budeson-Glycopyrrol-Formoterol ?Inhale 2 puffs into the lungs 2 (two) times daily. ?  ?diclofenac Sodium 1 % Gel ?Commonly known as: Voltaren ?Apply 4 g topically 2 (two) times daily as needed (As needed for pain). ?  ?doxycycline 100 MG capsule ?Commonly known as: VIBRAMYCIN ?Take 1 capsule (100 mg total) by mouth 2 (two) times daily for 5 days. ?  ?empagliflozin 10 MG Tabs tablet ?Commonly known as: Jardiance ?Take 1 tablet (10 mg total) by mouth daily before breakfast. ?  ?feeding supplement Liqd ?Take 237 mLs by mouth 2 (two) times daily between meals. ?  ?furosemide 40 MG tablet ?Commonly known as: LASIX ?Take 1 tablet (40 mg total) by mouth daily. ?  ?gabapentin 300 MG capsule ?Commonly known as: NEURONTIN ?Take 1 capsule (300 mg total) by mouth 3 (three) times daily. ?  ?GNP Aspirin Low Dose 81 MG EC tablet ?Generic drug: aspirin ?TAKE 1 TABLET BY MOUTH ONCE DAILY. *SWALLOW WHOLE* ?  ?levocetirizine 5 MG tablet ?Commonly known as: XYZAL ?TAKE (1/2) TABLET BY MOUTH EVERY OTHER DAY. ?  ?Melatonin 10 MG Tabs ?Take 10 mg by mouth at bedtime. ?  ?multivitamin with minerals Tabs  tablet ?Take 1 tablet by mouth daily. ?  ?oxyCODONE 5 MG immediate release tablet ?Commonly known as: Oxy IR/ROXICODONE ?Take 2 tablets (10 mg total) by mouth 2 (two) times daily. May also take 1 tablet (5 mg total) at bedtime as needed for severe pain. Must last 30 days.Marland Kitchen ?Start taking on: Dec 23, 2021 ?  ?oxyCODONE 5 MG immediate release tablet ?Commonly known as: Oxy IR/ROXICODONE ?Take 2 tablets (10 mg total) by mouth 2 (two) times daily. May also take 1 tablet (5 mg total) at bedtime as needed for severe pain. Must last 30 days.Marland Kitchen ?Start taking on: January 22, 2022 ?  ?oxyCODONE 5 MG immediate release tablet ?Commonly known as: Oxy IR/ROXICODONE ?Take 2 tablets (10 mg total) by mouth 2 (two) times daily. May also take 1 tablet (5 mg total) at bedtime as needed for severe pain. Must last 30 days.Marland Kitchen ?Start taking on: February 21, 2022 ?  ?OXYGEN ?Inhale 2 L into the lungs daily. ?  ?rOPINIRole 2 MG tablet ?Commonly known as: REQUIP ?  TAKE 2 TABLETS BY MOUTH AT BEDTIME ?  ?Vitamin D 50 MCG (2000 UT) tablet ?Take 2,000 Units by mouth daily. ?  ? ?  ? ?  ?  ? ? ?  ?Discharge Care Instructions  ?(From admission, onward)  ?  ? ? ?  ? ?  Start     Ordered  ? 12/19/21 0000  Discharge wound care:       ?Comments: Wash the left lateral ankle wound with soap and water, pat dry. Place a small piece of Aquacel Advantage over the ankle wound area, top with dry 4 x 4 gauze, secure with a few turns of kerlix. Change daily. MOISTEN WITH SALINE TO REMOVE.  ? 12/19/21 1052  ? ?  ?  ? ?  ? ?Allergies  ?Allergen Reactions  ? Flexeril [Cyclobenzaprine Hcl]   ?  Severe Rash  ? Vancomycin Rash  ?  Red Mans Syndrome  ? ? Follow-up Information   ? ? Olin Hauser, DO Follow up.   ?Specialty: Family Medicine ?Contact information: ?[address] [place] [address] ?[phone] ? ? ?  ?  ? ?  ?  ? ?  ? ? ? ?The results of significant diagnostics from this hospitalization (including imaging, microbiology, ancillary and laboratory) are listed  below for reference.   ? ?Significant Diagnostic Studies: ?DG Ankle Complete Left ? ?Result Date: 12/17/2021 ?CLINICAL DATA:  Left ankle wound EXAM: LEFT ANKLE COMPLETE - 3+ VIEW COMPARISON:  None. FINDINGS: Normal a

## 2021-12-19 NOTE — Progress Notes (Signed)
[facility] Cardiology ? ?SUBJECTIVE: Patient laying in bed, reports feeling better, with less shortness of breath ? ? ?Vitals:  ? 12/19/21 0000 12/19/21 0352 12/19/21 0500 12/19/21 0747  ?BP: (!) 96/55 (!) 98/57  (!) 103/54  ?Pulse: 60 60  61  ?Resp: 20 18  17   ?Temp: 98.9 ?F (37.2 ?C) 99.1 ?F (37.3 ?C)  98.2 ?F (36.8 ?C)  ?TempSrc: Oral Oral  Oral  ?SpO2: 92% 91%  91%  ?Weight:   77.7 kg   ? ? ? ?Intake/Output Summary (Last 24 hours) at 12/19/2021 1054 ?Last data filed at 12/19/2021 0746 ?Gross per 24 hour  ?Intake 754.83 ml  ?Output 1600 ml  ?Net -845.17 ml  ? ? ? ? ?PHYSICAL EXAM ? ?General: Well developed, well nourished, in no acute distress ?HEENT:  Normocephalic and atramatic ?Neck:  No JVD.  ?Lungs: Clear bilaterally to auscultation and percussion. ?Heart: HRRR . Normal S1 and S2 without gallops or murmurs.  ?Abdomen: Bowel sounds are positive, abdomen soft and non-tender  ?Msk:  Back normal, normal gait. Normal strength and tone for age. ?Extremities: No clubbing, cyanosis or edema.   ?Neuro: Alert and oriented X 3. ?Psych:  Good affect, responds appropriately ? ? ?LABS: ?Basic Metabolic Panel: ?Recent Labs  ?  12/18/21 ?0628 12/19/21 ?0542  ?NA 136 135  ?K 4.4 4.0  ?CL 107 106  ?CO2 19* 21*  ?GLUCOSE 97 106*  ?BUN 32* 32*  ?CREATININE 1.28* 1.18*  ?CALCIUM 8.6* 8.6*  ? ?Liver Function Tests: ?Recent Labs  ?  12/17/21 ?2113 12/18/21 ?0628  ?AST 38 168*  ?ALT 26 83*  ?ALKPHOS 102 145*  ?BILITOT 1.1 1.1  ?PROT 7.4 6.9  ?ALBUMIN 3.5 3.1*  ? ?No results for input(s): LIPASE, AMYLASE in the last 72 hours. ?CBC: ?Recent Labs  ?  12/17/21 ?2113 12/18/21 ?0628 12/19/21 ?0542  ?WBC 8.7 9.4 7.8  ?NEUTROABS 7.6  --   --   ?HGB 10.5* 11.2* 9.5*  ?HCT 34.2* 36.9 30.7*  ?MCV 89.3 90.9 88.2  ?PLT 163 122* 120*  ? ?Cardiac Enzymes: ?No results for input(s): CKTOTAL, CKMB, CKMBINDEX, TROPONINI in the last 72 hours. ?BNP: ?Invalid input(s): POCBNP ?D-Dimer: ?No results for input(s): DDIMER in the last 72 hours. ?Hemoglobin A1C: ?No  results for input(s): HGBA1C in the last 72 hours. ?Fasting Lipid Panel: ?No results for input(s): CHOL, HDL, LDLCALC, TRIG, CHOLHDL, LDLDIRECT in the last 72 hours. ?Thyroid Function Tests: ?No results for input(s): TSH, T4TOTAL, T3FREE, THYROIDAB in the last 72 hours. ? ?Invalid input(s): FREET3 ?Anemia Panel: ?No results for input(s): VITAMINB12, FOLATE, FERRITIN, TIBC, IRON, RETICCTPCT in the last 72 hours. ? ?DG Ankle Complete Left ? ?Result Date: 12/17/2021 ?CLINICAL DATA:  Left ankle wound EXAM: LEFT ANKLE COMPLETE - 3+ VIEW COMPARISON:  None. FINDINGS: Normal alignment. No acute fracture or dislocation. No osseous erosion. No ankle effusion. Vascular calcifications are noted. There is diffuse soft tissue swelling of the visualized left lower extremity with resultant mild bimalleolar soft tissue swelling. Dressing material overlies the lateral malleolus. IMPRESSION: Diffuse soft tissue swelling.  No acute fracture or dislocation. Electronically Signed   By: Fidela Salisbury M.D.   On: 12/17/2021 21:46  ? ?CT HEAD WO CONTRAST (5MM) ? ?Result Date: 12/18/2021 ?CLINICAL DATA:  Mental status change, unknown cause EXAM: CT HEAD WITHOUT CONTRAST TECHNIQUE: Contiguous axial images were obtained from the base of the skull through the vertex without intravenous contrast. RADIATION DOSE REDUCTION: This exam was performed according to the departmental dose-optimization program which includes  automated exposure control, adjustment of the mA and/or kV according to patient size and/or use of iterative reconstruction technique. COMPARISON:  11/25/2021. FINDINGS: Brain: No evidence of acute infarction, hemorrhage, cerebral edema, mass, mass effect, or midline shift. No hydrocephalus or extra-axial fluid collection. Normal gray-white differentiation. Vascular: No hyperdense vessel. Atherosclerotic calcifications in the intracranial carotid and vertebral arteries. Skull: Hyperostosis frontalis. Negative for fracture or focal  lesion. Sinuses/Orbits: Mucosal thickening in the ethmoid air cells. Status post bilateral lens replacements. Other: Trace fluid in left mastoid tip. IMPRESSION: IMPRESSION No acute intracranial process. No etiology seen for the patient's altered mental status Electronically Signed   By: Merilyn Baba M.D.   On: 12/18/2021 01:44  ? ?CT Angio Chest Pulmonary Embolism (PE) W or WO Contrast ? ?Result Date: 12/18/2021 ?CLINICAL DATA:  Code sepsis short of breath EXAM: CT ANGIOGRAPHY CHEST WITH CONTRAST TECHNIQUE: Multidetector CT imaging of the chest was performed using the standard protocol during bolus administration of intravenous contrast. Multiplanar CT image reconstructions and MIPs were obtained to evaluate the vascular anatomy. RADIATION DOSE REDUCTION: This exam was performed according to the departmental dose-optimization program which includes automated exposure control, adjustment of the mA and/or kV according to patient size and/or use of iterative reconstruction technique. CONTRAST:  166mL OMNIPAQUE IOHEXOL 350 MG/ML SOLN COMPARISON:  Chest x-ray 12/17/2021, PET CT 11/26/2021, CT chest 11/25/2021, 11/05/2021 FINDINGS: Cardiovascular: Satisfactory opacification of the pulmonary arteries to the segmental level. No evidence of pulmonary embolism. Dilated pulmonary trunk and main pulmonary arteries suspicious for arterial hypertension. Ascending aortic diameter measures up to 4.3 cm, no significant change. Cardiomegaly with coronary vascular calcification. No pericardial effusion. Partially visualized intracardiac pacing leads. Moderate aortic atherosclerosis. Mediastinum/Nodes: Midline trachea. No suspicious thyroid mass. Slight increased size of AP window lymph node measuring 14 mm, previously 10 mm. Multiple right hilar lymph nodes also slightly increased. Esophagus within normal limits. Lungs/Pleura: Emphysema. Excessive respiratory motion artifact. No pleural effusion or pneumothorax. Right upper lobe lung  mass is less confluent as compared with previous exam. A solid nodular area within the posterior right upper lobe measures 2.3 x 2.2 cm and may reflect a separate or new pulmonary nodule. Ill-defined consolidation and ground-glass density in the right upper lobe in the region of the previously noted lung mass. Additional mild ground-glass densities in the bilateral lower lobes with interstitial thickening noted. Increased size of left upper lobe pulmonary nodule, series 6, image 43, measuring 9 mm previously 6 mm. Other pulmonary nodules are difficult to visualize due to significant respiratory motion artifact. Upper Abdomen: No acute abnormality. Musculoskeletal: No chest wall abnormality. No acute or significant osseous findings. Review of the MIP images confirms the above findings. IMPRESSION: 1. Negative for acute pulmonary embolus. 2. Significant respiratory motion degradation. 3. The previously noted right upper lobe lung mass appears less confluent on today's examination without discretely measurable mass. A nodule in the posterior right upper lobe may be new or less likely residual of the previously noted confluent right upper lobe lung mass. There is slight increased size of a previously noted left upper lobe pulmonary nodule, suggesting potential metastatic focus. 4. Emphysema. Diffuse ground-glass density in the right upper lobe with additional interstitial and patchy ground-glass densities in the bilateral lower lobes suspicious for superimposed pneumonia/infection. Aortic Atherosclerosis (ICD10-I70.0) and Emphysema (ICD10-J43.9). Electronically Signed   By: Donavan Foil M.D.   On: 12/18/2021 02:02  ? ?DG Chest Port 1 View ? ?Result Date: 12/18/2021 ?CLINICAL DATA:  Acute respiratory failure with hypoxia. Increased  weakness/lethargy. Known lung cancer, CHF, atrial fibrillation. EXAM: PORTABLE CHEST 1 VIEW COMPARISON:  Chest x-rays dated 12/17/2021 and 11/05/2021. FINDINGS: Stable cardiomegaly. LEFT chest  wall pacemaker/ICD apparatus appears stable in position. Coarse lung markings are again seen bilaterally. Subtle ground-glass opacities within the RIGHT upper lobe, better demonstrated on the chest CT angi

## 2021-12-20 DIAGNOSIS — N1831 Chronic kidney disease, stage 3a: Secondary | ICD-10-CM

## 2021-12-20 DIAGNOSIS — I4891 Unspecified atrial fibrillation: Secondary | ICD-10-CM

## 2021-12-20 DIAGNOSIS — I4892 Unspecified atrial flutter: Secondary | ICD-10-CM | POA: Diagnosis not present

## 2021-12-20 DIAGNOSIS — Z87891 Personal history of nicotine dependence: Secondary | ICD-10-CM

## 2021-12-20 DIAGNOSIS — E785 Hyperlipidemia, unspecified: Secondary | ICD-10-CM

## 2021-12-20 DIAGNOSIS — I5032 Chronic diastolic (congestive) heart failure: Secondary | ICD-10-CM | POA: Diagnosis not present

## 2021-12-20 DIAGNOSIS — J432 Centrilobular emphysema: Secondary | ICD-10-CM

## 2021-12-21 ENCOUNTER — Ambulatory Visit: Payer: Medicare HMO

## 2021-12-21 ENCOUNTER — Other Ambulatory Visit: Payer: Self-pay | Admitting: Family Medicine

## 2021-12-21 ENCOUNTER — Telehealth: Payer: Self-pay

## 2021-12-21 ENCOUNTER — Ambulatory Visit (INDEPENDENT_AMBULATORY_CARE_PROVIDER_SITE_OTHER): Payer: Medicare HMO

## 2021-12-21 ENCOUNTER — Encounter: Payer: Self-pay | Admitting: Internal Medicine

## 2021-12-21 ENCOUNTER — Encounter: Payer: Self-pay | Admitting: *Deleted

## 2021-12-21 ENCOUNTER — Other Ambulatory Visit: Payer: Self-pay

## 2021-12-21 ENCOUNTER — Ambulatory Visit
Admission: RE | Admit: 2021-12-21 | Discharge: 2021-12-21 | Disposition: A | Discharge status: Home / Self Care | Payer: Medicare HMO | Source: Ambulatory Visit | Attending: Radiation Oncology | Admitting: Radiation Oncology

## 2021-12-21 ENCOUNTER — Encounter: Payer: Self-pay | Admitting: Nurse Practitioner

## 2021-12-21 DIAGNOSIS — C771 Secondary and unspecified malignant neoplasm of intrathoracic lymph nodes: Secondary | ICD-10-CM | POA: Diagnosis not present

## 2021-12-21 DIAGNOSIS — E785 Hyperlipidemia, unspecified: Secondary | ICD-10-CM

## 2021-12-21 DIAGNOSIS — Z87891 Personal history of nicotine dependence: Secondary | ICD-10-CM | POA: Diagnosis not present

## 2021-12-21 DIAGNOSIS — C3411 Malignant neoplasm of upper lobe, right bronchus or lung: Secondary | ICD-10-CM | POA: Diagnosis not present

## 2021-12-21 DIAGNOSIS — J441 Chronic obstructive pulmonary disease with (acute) exacerbation: Secondary | ICD-10-CM

## 2021-12-21 DIAGNOSIS — I5032 Chronic diastolic (congestive) heart failure: Secondary | ICD-10-CM

## 2021-12-21 DIAGNOSIS — R531 Weakness: Secondary | ICD-10-CM

## 2021-12-21 DIAGNOSIS — Z510 Encounter for antineoplastic radiation therapy: Secondary | ICD-10-CM | POA: Diagnosis not present

## 2021-12-21 DIAGNOSIS — J432 Centrilobular emphysema: Secondary | ICD-10-CM

## 2021-12-21 DIAGNOSIS — S81802A Unspecified open wound, left lower leg, initial encounter: Secondary | ICD-10-CM

## 2021-12-21 DIAGNOSIS — R296 Repeated falls: Secondary | ICD-10-CM

## 2021-12-21 DIAGNOSIS — R918 Other nonspecific abnormal finding of lung field: Secondary | ICD-10-CM

## 2021-12-21 LAB — RAD ONC ARIA SESSION SUMMARY
Course Elapsed Days: 13
Plan Fractions Treated to Date: 9
Plan Prescribed Dose Per Fraction: 6 Gy
Plan Total Fractions Prescribed: 10
Plan Total Prescribed Dose: 60 Gy
Reference Point Dosage Given to Date: 54 Gy
Reference Point Session Dosage Given: 6 Gy
Session Number: 9

## 2021-12-21 LAB — LEGIONELLA PNEUMOPHILA SEROGP 1 UR AG: L. pneumophila Serogp 1 Ur Ag: NEGATIVE

## 2021-12-21 NOTE — Telephone Encounter (Signed)
Order submitted online at [email or web address] for medical supplies (supplier of [employer] [facility]).  Order reciept verified by [facility] representative with contact  phone [phone]. ?

## 2021-12-21 NOTE — Telephone Encounter (Signed)
Transition Care Management Follow-up Telephone Call ?Date of discharge and from where: [facility] 4/29 ?How have you been since you were released from the hospital? OK, weak ?Any questions or concerns? No ? ?Items Reviewed: ?Did the pt receive and understand the discharge instructions provided? Yes  ?Medications obtained and verified? Yes  ?Other? No  ?Any new allergies since your discharge? No  ?Dietary orders reviewed? No ?Do you have support at home? Yes  ? ?Functional Questionnaire: (I = Independent and D = Dependent) ?ADLs: D ? ?Bathing/Dressing- D ? ?Meal Prep- D ? ?Eating- I ? ?Maintaining continence- I ? ?Transferring/Ambulation- D ? ?Managing Meds- D ? ?Follow up appointments reviewed: ? ?PCP Hospital f/u appt confirmed? Yes  Scheduled to see Dr.Karamalegos on 12/25/22 @ 11am. ?[facility] f/u appt confirmed? Yes  Scheduled to see Dr.Guzler on 5/2 @ 2. ?Are transportation arrangements needed? No  ?If their condition worsens, is the pt aware to call PCP or go to the Emergency Dept.? Yes ?Was the patient provided with contact information for the PCP's office or ED? Yes ?Was to pt encouraged to call back with questions or concerns? Yes  ?

## 2021-12-21 NOTE — Progress Notes (Signed)
I spoke to patient?s granddaughter, Miss [name] follow up after recent hospitalization. As per family patient doing better. Also discussed follow up/imaging.

## 2021-12-21 NOTE — Telephone Encounter (Signed)
Order submitted online at [email or web address] for medical supplies (supplier of [facility]).  Order reciept verified by [facility] representative with contact  phone [phone]. ?

## 2021-12-21 NOTE — Chronic Care Management (AMB) (Signed)
?Chronic Care Management  ? ?CCM RN Visit Note ? ?12/21/2021 ?Name: Kristin [name] MRN: [id number] DOB: 1930-06-19 ? ?Subjective: ?Kristin [name] is a 88 y.o. year old female who is a primary care patient of Olin Hauser, DO. The care management team was consulted for assistance with disease management and care coordination needs.   ? ?Engaged with patient by telephone for follow up visit in response to provider referral for case management and/or care coordination services.  ? ?Consent to Services:  ?The patient was given information about Chronic Care Management services, agreed to services, and gave verbal consent prior to initiation of services.  Please see initial visit note for detailed documentation.  ? ?Patient agreed to services and verbal consent obtained.  ? ?Assessment: Review of patient past medical history, allergies, medications, health status, including review of consultants reports, laboratory and other test data, was performed as part of comprehensive evaluation and provision of chronic care management services.  ? ?SDOH (Social Determinants of Health) assessments and interventions performed:   ? ?CCM Care Plan ? ?Allergies  ?Allergen Reactions  ? Flexeril [Cyclobenzaprine Hcl]   ?  Severe Rash  ? Vancomycin Rash  ?  [place] Syndrome  ? ? ?Outpatient Encounter Medications as of 12/21/2021  ?Medication Sig Note  ? acetaminophen (TYLENOL) 500 MG tablet Take 500 mg by mouth in the morning and at bedtime.   ? albuterol (PROVENTIL) (2.5 MG/3ML) 0.083% nebulizer solution USE 1 VIAL IN NEBULIZER EVERY 8 HOURS AS NEEDED FOR WHEEZING OR SHORTNESS OF BREATH. 12/18/2021: Last filled 11/16/21  ? amoxicillin-clavulanate (AUGMENTIN) 875-125 MG tablet Take 1 tablet by mouth 2 (two) times daily for 5 days.   ? apixaban (ELIQUIS) 2.5 MG TABS tablet Take 1 tablet (2.5 mg total) by mouth 2 (two) times daily. 12/18/2021: Last filled 12/07/21 30 day supply  ? atorvastatin (LIPITOR) 20 MG tablet TAKE 1 TABLET BY MOUTH  ONCE DAILY FOR CHOLESTEROL 12/18/2021: Last filled 11/16/21 30 day supply  ? bisacodyl (DULCOLAX) 5 MG EC tablet Take 2 tablets (10 mg total) by mouth daily as needed for moderate constipation.   ? BREZTRI AEROSPHERE 160-9-4.8 MCG/ACT AERO Inhale 2 puffs into the lungs 2 (two) times daily. 12/18/2021: Last filled 12/07/21 30 day supply  ? Cholecalciferol (VITAMIN D) 2000 units tablet Take 2,000 Units by mouth daily.    ? diclofenac Sodium (VOLTAREN) 1 % GEL Apply 4 g topically 2 (two) times daily as needed (As needed for pain). 12/18/2021: Last filled 10/26/21  ? doxycycline (VIBRAMYCIN) 100 MG capsule Take 1 capsule (100 mg total) by mouth 2 (two) times daily for 5 days.   ? empagliflozin (JARDIANCE) 10 MG TABS tablet Take 1 tablet (10 mg total) by mouth daily before breakfast.   ? feeding supplement (ENSURE ENLIVE / ENSURE PLUS) LIQD Take 237 mLs by mouth 2 (two) times daily between meals.   ? furosemide (LASIX) 40 MG tablet Take 1 tablet (40 mg total) by mouth daily. 12/18/2021: Last filled 10/20/21 90 day supply  ? gabapentin (NEURONTIN) 300 MG capsule Take 1 capsule (300 mg total) by mouth 3 (three) times daily. 12/18/2021: Last filled 12/07/21 30 day supply  ? GNP ASPIRIN LOW DOSE 81 MG EC tablet TAKE 1 TABLET BY MOUTH ONCE DAILY. *SWALLOW WHOLE*   ? levocetirizine (XYZAL) 5 MG tablet TAKE (1/2) TABLET BY MOUTH EVERY OTHER DAY. 12/18/2021: Last filled 12/07/21 8 tabs 32 day supply  ? Melatonin 10 MG TABS Take 10 mg by mouth at bedtime.   ?  Multiple Vitamin (MULTIVITAMIN WITH MINERALS) TABS tablet Take 1 tablet by mouth daily.   ? [START ON 12/23/2021] oxyCODONE (OXY IR/ROXICODONE) 5 MG immediate release tablet Take 2 tablets (10 mg total) by mouth 2 (two) times daily. May also take 1 tablet (5 mg total) at bedtime as needed for severe pain. Must last 30 days..   ? [START ON 01/22/2022] oxyCODONE (OXY IR/ROXICODONE) 5 MG immediate release tablet Take 2 tablets (10 mg total) by mouth 2 (two) times daily. May also take 1 tablet  (5 mg total) at bedtime as needed for severe pain. Must last 30 days..   ? [START ON 02/21/2022] oxyCODONE (OXY IR/ROXICODONE) 5 MG immediate release tablet Take 2 tablets (10 mg total) by mouth 2 (two) times daily. May also take 1 tablet (5 mg total) at bedtime as needed for severe pain. Must last 30 days.. 12/18/2021: WARNING: Not a Duplicate. Future prescription. DO NOT DELETE during hospital medication reconciliation or at discharge. ?[facility] Chronic Pain Management Patient  ? ?Last filled 12/07/21 30 day supply  ? OXYGEN Inhale 2 L into the lungs daily.   ? rOPINIRole (REQUIP) 2 MG tablet TAKE 2 TABLETS BY MOUTH AT BEDTIME 12/18/2021: Last filled 10/21/21 90 day supply  ? ?No facility-administered encounter medications on file as of 12/21/2021.  ? ? ?Patient Active Problem List  ? Diagnosis Date Noted  ? Sepsis ([place]) 12/18/2021  ? Pneumonia 12/18/2021  ? Palliative care encounter   ? Hypertension 12/17/2021  ? AMS (altered mental status) 12/17/2021  ? Malignant neoplasm of right upper lobe of lung ([place]) 12/02/2021  ? Small cell lung cancer ([place]) 11/17/2021  ? Goals of care, counseling/discussion 11/17/2021  ? Overweight (BMI 25.0-29.9) 11/11/2021  ? Bacteremia 11/06/2021  ? CAP (community acquired pneumonia) 11/05/2021  ? Postobstructive pneumonia 11/05/2021  ? Mass of upper lobe of right lung 11/05/2021  ? Sepsis due to Escherichia coli (E. coli) ([place]) 11/05/2021  ? Dyslipidemia 11/05/2021  ? COPD with acute exacerbation ([place]) 09/20/2021  ? Acute on chronic diastolic CHF (congestive heart failure) ([place]) 09/19/2021  ? Lymphedema 09/17/2021  ? Ischemic leg pain (Bilateral) 08/19/2021  ? Paroxysmal atrial fibrillation (HCC)   ? History of atrial fibrillation 04/08/2021  ? PVD (peripheral vascular disease) ([place]) 04/08/2021  ? Low blood pressure reading 04/07/2021  ? Physical tolerance to opiate drug 01/28/2021  ? Chronic use of opiate for therapeutic purpose 11/24/2020  ? Atherosclerosis of native arteries of the extremities  with ulceration ([place]) 11/03/2020  ? Uncomplicated opioid dependence ([place]) 10/20/2020  ? Encounter for chronic pain management 10/02/2020  ? Encounter for pain management counseling 10/02/2020  ? Chronic kidney disease 10/02/2020  ? Chronic sacroiliac pain (Bilateral) 09/30/2020  ? Osteoarthritis of sacroiliac joints (Bilateral) ([place]) 09/30/2020  ? Chronic midline low back pain without sciatica 09/30/2020  ? Acute respiratory failure with hypoxia ([place]) 08/02/2020  ? Prolonged QT interval 08/02/2020  ? Venous insufficiency of both lower extremities 07/04/2020  ? Varicose veins of left lower extremity with inflammation, with ulcer of ankle limited to breakdown of skin ([place]) 04/01/2020  ? Opioid dependence with opioid-induced disorder ([place]) 02/14/2020  ? Abnormal MRI, lumbar spine (11/03/2019) 01/23/2020  ? Pharmacologic therapy 12/20/2019  ? Other specified dorsopathies, sacral and sacrococcygeal region 06/12/2019  ? Chronic hip pain (Left) 06/12/2019  ? Osteoarthritis of hip (Left) 06/12/2019  ? Greater trochanteric bursitis of hip (Left) 03/12/2019  ? Preop testing 01/22/2019  ? Cervicalgia 10/03/2018  ? Chronic shoulders pain (Bilateral) (L>R) 05/29/2018  ?  Osteoarthritis of shoulder (Bilateral) 05/29/2018  ? Osteoarthritis of hip (Bilateral) 05/29/2018  ? Chronic shoulder pain after shoulder replacement (Bilateral) (L>R) 05/29/2018  ? Trigger finger, left middle finger 05/29/2018  ? Spondylosis without myelopathy or radiculopathy, lumbosacral region 02/09/2018  ? Insomnia 02/01/2018  ? Numbness and tingling of upper extremity (Bilateral) 09/07/2017  ? Chronic shoulder pain, Radicular (Bilateral) (L>R) 09/07/2017  ? Cervical facet syndrome (Bilateral) (L>R) 09/07/2017  ? Cervical (3-4 mm) Anterolisthesis of C4 on C5 09/07/2017  ? Cervical foraminal stenosis (Bilateral) 09/07/2017  ? Acute kidney injury superimposed on CKD ([place]) 08/14/2017  ? Chronic lower extremity pain (2ry area of Pain) (Bilateral) (L>R)  05/13/2017  ? DDD (degenerative disc disease), cervical 05/13/2017  ? Chronic cervical radicular pain (3ry area of Pain) (Bilateral) (L>R) 05/04/2017  ? Chronic anticoagulation (Eliquis) 05/04/2017  ? Chronic h

## 2021-12-21 NOTE — Patient Instructions (Signed)
Visit Information ? ?Thank you for taking time to visit with me today. Please don't hesitate to contact me if I can be of assistance to you before our next scheduled telephone appointment. ? ?Following are the goals we discussed today:  ?RNCM Clinical Goal(s):  ?Patient will verbalize understanding of plan for management of CHF, HLD, COPD, and Lung cancer, falls and safety concerns as evidenced by keeping appointments, monitoring for changes in chronic conditions and calling the office for questions and concerns, and working with the CCM team to optimize health and well being ?take all medications exactly as prescribed and will call provider for medication related questions as evidenced by compliance with medications and calling for refills before running out  of medications    ?attend all scheduled medical appointments: as scheduled with pcp and specialist as evidenced by keeping appointments and calling the office for schedule change needs        ?demonstrate improved and ongoing adherence to prescribed treatment plan for CHF, HLD, COPD, and Lung cancer and falls and safety as evidenced by no acute exacerbations in conditions, stable labs and stable VS, follow up with pcp and specialist on a routine basis ?demonstrate ongoing self health care management ability for effective management of Chronic conditions as evidenced by working with the CCM team through collaboration with Consulting civil engineer, provider, and care team.  ?  ?Interventions: ?1:1 collaboration with primary care provider regarding development and update of comprehensive plan of care as evidenced by provider attestation and co-signature ?Inter-disciplinary care team collaboration (see longitudinal plan of care) ?Evaluation of current treatment plan related to  self management and patient's adherence to plan as established by provider ?  ?  ?Heart Failure Interventions:  (Status: Goal on Track (progressing): YES.)  Long Term Goal  ?   ?Wt Readings from  Last 3 Encounters:  ?12/19/21 171 lb 4.8 oz (77.7 kg)  ?12/16/21 158 lb (71.7 kg)  ?12/14/21 161 lb (73 kg)  ?  ?Basic overview and discussion of pathophysiology of Heart Failure reviewed. 12-21-2021: Patient recently in the hospital for fever and AMS with diagnosis of pneumonia. Patient discharged home on 12-19-2021 with family. Spoke to the patients granddaughter today and the patient is doing well. Has 2 more radiation treatments.  ?Provided education on low sodium diet. 12-21-2021: The patient is compliant with heart healthy diet ?Reviewed Heart Failure Action Plan in depth and provided written copy ?Assessed need for readable accurate scales in home ?Provided education about placing scale on hard, flat surface ?Advised patient to weigh each morning after emptying bladder ?Discussed importance of daily weight and advised patient to weigh and record daily ?Reviewed role of diuretics in prevention of fluid overload and management of heart failure ?Discussed the importance of keeping all appointments with provider. 12-21-2021: Advised the patients granddaughter that she needs a follow up visit with the pcp post discharge from the hospital.  ?Provided patient with education about the role of exercise in the management of heart failure ?Advised patient to discuss changes in HF or heart  health with provider ?Screening for signs and symptoms of depression related to chronic disease state  ?Assessed social determinant of health barriers ?  ?COPD: (Status: Goal on Track (progressing): YES.) Long Term Goal  ?Reviewed medications with patient, including use of prescribed maintenance and rescue inhalers, and provided instruction on medication management and the importance of adherence. 12-21-2021: The patient is compliant with medications and inhalers for effective management of COPD ?Provided patient with basic written  and verbal COPD education on self care/management/and exacerbation prevention ?Advised patient to track and  manage COPD triggers. 12-21-2021: The patient was recently in the hospital for fever and CT showed evidence of pneumonia. The patient is being treated with abx and the granddaughter states they are monitoring her closely.  ?Provided written and verbal instructions on pursed lip breathing and utilized returned demonstration as teach back ?Provided instruction about proper use of medications used for management of COPD including inhalers. 12-21-2021: The family assist with her medications and making sure she takes as directed.  ?Advised patient to self assesses COPD action plan zone and make appointment with provider if in the yellow zone for 48 hours without improvement ?Advised patient to engage in light exercise as tolerated 3-5 days a week to aid in the the management of COPD ?Provided education about and advised patient to utilize infection prevention strategies to reduce risk of respiratory infection. 12-21-2021: Review of monitoring for sx and sx of infection and to call the provider for fever, chills, brown or green sputum, and other sx and sx of infection. Education and support given.  ?Discussed the importance of adequate rest and management of fatigue with COPD ?  ?Falls:  (Status: Goal on Track (progressing): YES.) Long Term Goal  ?Provided written and verbal education re: potential causes of falls and Fall prevention strategies ?Reviewed medications and discussed potential side effects of medications such as dizziness and frequent urination ?Advised patient of importance of notifying provider of falls. 11-30-2021: Reviewed with the patients granddaughter the importance of notifying the provider of new falls. Per granddaughter the 2 falls he has had in the last month is the first falls she has ever had.  ?Assessed for signs and symptoms of orthostatic hypotension ?Assessed for falls since last encounter. 11-30-2021: The last falls were 11-20-2021 and 11-25-2021. 12-21-2021: The granddaughter denies any new falls. The  patient was in the hospital 12-17-2021 to 12-19-2021. Before hospitalization she was working with the cancer center on getting the patient a hospital bed and other DME. The granddaughter is calling to follow up because a new order has to be given since the cancer center did not get documentation back in a timely manner.  ?Assessed patients knowledge of fall risk prevention secondary to previously provided education ?Provided patient information for fall alert systems ?Assessed working status of life alert bracelet and patient adherence ?Advised patient to discuss safety and fall prevention, and the need for additional DME with provider. 11-30-2021: The patient uses a walker when ambulating. Before changes in functional status she used a cane but now she uses a walker. The granddaughter has purchased a bed alarm to use in the home so her uncle will be alerted when the patient gets out of bed at night. She also has purchased railings to go on the side of the bed. The patient is opposed to a hospital bed at this time. The patient has a follow up on 12-03-2021 with the pcp to have stables removed from the patients head from fall on 11-20-2021. [name] states that over the last month the patients condition has declined. She feels in the future they may need more assistance in the home. Her Uncle [name] has a part time job but is there with the patient most of the time. Other family comes in and assist with her care. Will send [place] resources via My chart, EMMI system, and Email for review. Will continue to monitor for changes.  ?  ?Hyperlipidemia:  (Status: Goal on  Track (progressing): YES.) Long Term Goal  ?     ?Lab Results  ?Component Value Date  ?  CHOL 109 11/05/2021  ?  HDL 51 11/05/2021  ?  LDLCALC 49 11/05/2021  ?  TRIG 46 11/05/2021  ?  CHOLHDL 2.1 11/05/2021  ?  ?  ?Medication review performed; medication list updated in electronic medical record. 12-21-2021: The patient takes Atorvastatin 20 mg QD ?Provider  established cholesterol goals reviewed; ?Counseled on importance of regular laboratory monitoring as prescribed; ?Provided HLD educational materials; ?Reviewed role and benefits of statin for ASCVD risk reduction; ?Disc

## 2021-12-21 NOTE — Telephone Encounter (Signed)
Zack (adapt representative) suggest getting the orders sent to [facility].  Will work on this today. ?

## 2021-12-22 ENCOUNTER — Telehealth: Payer: Self-pay | Admitting: Nurse Practitioner

## 2021-12-22 ENCOUNTER — Encounter: Payer: Self-pay | Admitting: Nurse Practitioner

## 2021-12-22 ENCOUNTER — Ambulatory Visit: Payer: Medicare HMO

## 2021-12-22 ENCOUNTER — Other Ambulatory Visit: Payer: Self-pay

## 2021-12-22 ENCOUNTER — Ambulatory Visit
Admission: RE | Admit: 2021-12-22 | Discharge: 2021-12-22 | Disposition: A | Discharge status: Home / Self Care | Payer: Medicare HMO | Source: Ambulatory Visit | Attending: Radiation Oncology | Admitting: Radiation Oncology

## 2021-12-22 DIAGNOSIS — E46 Unspecified protein-calorie malnutrition: Secondary | ICD-10-CM

## 2021-12-22 DIAGNOSIS — Z87891 Personal history of nicotine dependence: Secondary | ICD-10-CM | POA: Diagnosis not present

## 2021-12-22 DIAGNOSIS — C3411 Malignant neoplasm of upper lobe, right bronchus or lung: Secondary | ICD-10-CM | POA: Diagnosis not present

## 2021-12-22 DIAGNOSIS — R5381 Other malaise: Secondary | ICD-10-CM

## 2021-12-22 DIAGNOSIS — Z510 Encounter for antineoplastic radiation therapy: Secondary | ICD-10-CM | POA: Diagnosis not present

## 2021-12-22 DIAGNOSIS — Z515 Encounter for palliative care: Secondary | ICD-10-CM

## 2021-12-22 DIAGNOSIS — C771 Secondary and unspecified malignant neoplasm of intrathoracic lymph nodes: Secondary | ICD-10-CM | POA: Diagnosis not present

## 2021-12-22 DIAGNOSIS — C3490 Malignant neoplasm of unspecified part of unspecified bronchus or lung: Secondary | ICD-10-CM

## 2021-12-22 LAB — RAD ONC ARIA SESSION SUMMARY
Course Elapsed Days: 14
Plan Fractions Treated to Date: 10
Plan Prescribed Dose Per Fraction: 6 Gy
Plan Total Fractions Prescribed: 10
Plan Total Prescribed Dose: 60 Gy
Reference Point Dosage Given to Date: 60 Gy
Reference Point Session Dosage Given: 6 Gy
Session Number: 10

## 2021-12-22 LAB — CULTURE, BLOOD (ROUTINE X 2)
Culture: NO GROWTH
Culture: NO GROWTH
Special Requests: ADEQUATE
Special Requests: ADEQUATE

## 2021-12-22 NOTE — Progress Notes (Signed)
? ? ?Manufacturing engineer ?Community Palliative Care Consult Note ?Telephone: [phone]  ?Fax: [phone]  ? ? ?Date of encounter: 12/22/21 ?4:17 PM ?PATIENT NAME: Kristin [name] ?[place] [id number]   ?[phone] (home)  ?DOB: [birth year] ?MRN: [id number] ?PRIMARY CARE PROVIDER:    ?Olin Hauser, DO,  ?[address] ?[place] [address] ?[phone] ? ?RESPONSIBLE PARTY:    ?Contact Information   ? ? Name Relation Home Work Mobile  ? Essie, Lagunes Son [phone]  [phone]  ? Marcellina Millin Granddaughter [phone]    ? ?  ? ?Due to the COVID-19 crisis, this visit was done via telemedicine from my office and it was initiated and consent by this patient and or family. ? ?I connected with [name], Ms. Tompkins granddaughter; son [name] with Kristin [name] OR PROXY on 12/22/21 by a video enabled telemedicine application and verified that I am speaking with the correct person using two identifiers. ?  ?I discussed the limitations of evaluation and management by telemedicine. The patient expressed understanding and agreed to proceed.  Palliative Care was asked to follow this patient by consultation request of  [name] * to address advance care planning and complex medical decision making. This is a follow up visit.                                  ?ASSESSMENT AND PLAN / RECOMMENDATIONS:  ?Symptom Management/Plan: ?1. Advance Care Planning;  Ongoing discussions, will re-visit next PC visit. ?  ?2. Protein calorie malnutrition; discussed at length about nutrition, supplements, foods she likes to eat. Encouraged to eat with radiation treatments upcoming. Left ankle pressure sore followed by home health ?Weight 165.3 lbs (75 kg) ?Height 5'7" (67 inches) ?BMI 25.87 ?  ?3. Debility secondary to Small cell lung cancer-T2N1-stage II/limited stage ? ? ?Follow up Palliative Care Visit: Palliative care will continue to follow for complex medical decision  making, advance care planning, and clarification of goals. Return 6 weeks or prn. ? ?I spent 42 minutes providing this consultation. More than 50% of the time in this consultation was spent in counseling and care coordination. ?PPS: 40% ? ?Chief Complaint: Follow up palliative consult for complex medical decision making ? ?HISTORY OF PRESENT ILLNESS:  Kristin [name] is a 88 y.o. year old female  with multiple medical problems including Small Cell lung cancer, CHF, O2 dependent 2L/[place], COPD, afib on Eliquis, h/o NSTEMI, h/o falls, protein calorie malnutrition, arthritis, chronic pain syndrome multiple sites, h/o Overdose opiate accidental/unintentional (10/02/2020), h/o right breast cancer s/p lumpectomy with chemo/XRT, CKD, h/o cystocele, over actice bladder, HTN, HLD, RLS, gout, h/o hepatitis C, vaginal atrophy. I connected with Sterling Big, Ms. Viger son, Ms. Lahaie with granddaughter, [name]. We talked about how Kristin [name] has been feeling today as she is coming back from her treatment. Ms. Dascenzo endorses she is a little tired but denies ros. Ms. Wampole denies pain or shortness of breath. Ms. Vogelgesang endorses she is doing good. We talked about hospital bed and w/c which hopefully will be delivered before the end of the week. We talked about safety, fall risk. We talked about appetite. We talked about foods, nutrition, supplements. We talked about medical goals. We talked about daily routine. We talked about role pc in poc. We talked about family dynamics, with a very supportive involved family. Once Ms. Marchitto and [name] left video visit to schedule f/u  PC visit. [name] endorses Kristin [name] has been getting up in the middle of the night, they are putting bed alarms on the hospital bed coming. We talked about camera's, baby monitors. We talked about Kristin [name] at 88 yrs old and has been receiving treatment, can be multifactorial. We talked about coping strategies. We talked about f/u PC visit, scheduled.  Therapeutic listening, emotional support provided. Questions answered.  ? ?History obtained from review of EMR, discussion with  [name] granddaughter, [name] son with Ms. Willig.  ?I reviewed available labs, medications, imaging, studies and related documents from the EMR.  Records reviewed and summarized above.  ? ?ROS ?10 point system reviewed all negative except HPI ? ?Physical Exam: ?deferred ? ?Thank you for the opportunity to participate in the care of Kristin [name].  The palliative care team will continue to follow. Please call our office at [phone] if we can be of additional assistance.  ? ?Jaelon Gatley Z Daleen Steinhaus, NP  ?

## 2021-12-22 NOTE — Telephone Encounter (Signed)
Requested Prescriptions  ?Pending Prescriptions Disp Refills  ?? albuterol (PROVENTIL) (2.5 MG/3ML) 0.083% nebulizer solution [Pharmacy Med Name: ALBUTEROL SUL 2.5 MG/3 ML SOLN] 90 mL 0  ?  Sig: USE 1 VIAL IN NEBULIZER EVERY 8 HOURS AS NEEDED FOR WHEEZING OR SHORTNESS OF BREATH.  ?  ? Pulmonology:  Beta Agonists 2 Passed - 12/21/2021  1:01 PM  ?  ?  Passed - Last BP in normal range  ?  BP Readings from Last 1 Encounters:  ?12/19/21 (!) 103/54  ?   ?  ?  Passed - Last Heart Rate in normal range  ?  Pulse Readings from Last 1 Encounters:  ?12/19/21 61  ?   ?  ?  Passed - Valid encounter within last 12 months  ?  Recent Outpatient Visits   ?      ? 2 weeks ago Recurrent falls  ? [place], DO  ? 1 month ago COPD with acute exacerbation [facility])  ? [place], DO  ? 2 months ago Chronic diastolic CHF (congestive heart failure) ([place])  ? [place], DO  ? 3 months ago Shortness of breath  ? [place], DO  ? 4 months ago Venous insufficiency of both lower extremities  ? [place], DO  ?  ?  ?Future Appointments   ?        ? Today Gusler, Christin Z, NP [facility]  ? In 2 days [place] Ranger Devonne Doughty, DO [facility] [place]  ?  ? ?  ?  ?  ? ?

## 2021-12-23 ENCOUNTER — Ambulatory Visit (INDEPENDENT_AMBULATORY_CARE_PROVIDER_SITE_OTHER): Payer: Medicare HMO | Admitting: Nurse Practitioner

## 2021-12-23 ENCOUNTER — Encounter (INDEPENDENT_AMBULATORY_CARE_PROVIDER_SITE_OTHER): Payer: Medicare HMO

## 2021-12-23 NOTE — Progress Notes (Signed)
This encounter was created in error - please disregard.

## 2021-12-24 ENCOUNTER — Ambulatory Visit (INDEPENDENT_AMBULATORY_CARE_PROVIDER_SITE_OTHER): Payer: Medicare HMO | Admitting: Family Medicine

## 2021-12-24 ENCOUNTER — Encounter: Payer: Self-pay | Admitting: Family Medicine

## 2021-12-24 VITALS — BP 130/80 | HR 67 | Ht 67.0 in | Wt 171.0 lb

## 2021-12-24 DIAGNOSIS — J189 Pneumonia, unspecified organism: Secondary | ICD-10-CM | POA: Diagnosis not present

## 2021-12-24 DIAGNOSIS — L97321 Non-pressure chronic ulcer of left ankle limited to breakdown of skin: Secondary | ICD-10-CM

## 2021-12-24 DIAGNOSIS — I83223 Varicose veins of left lower extremity with both ulcer of ankle and inflammation: Secondary | ICD-10-CM | POA: Diagnosis not present

## 2021-12-24 DIAGNOSIS — N183 Chronic kidney disease, stage 3 unspecified: Secondary | ICD-10-CM | POA: Diagnosis not present

## 2021-12-24 DIAGNOSIS — I129 Hypertensive chronic kidney disease with stage 1 through stage 4 chronic kidney disease, or unspecified chronic kidney disease: Secondary | ICD-10-CM | POA: Diagnosis not present

## 2021-12-24 DIAGNOSIS — I5032 Chronic diastolic (congestive) heart failure: Secondary | ICD-10-CM | POA: Diagnosis not present

## 2021-12-24 NOTE — Patient Instructions (Addendum)
Thank you for coming to the office today. ? ?Mucinex Syrup OTC with DM cough suppressant ? ?Finish antibiotics today. ? ?Lungs mostly clear still hear some coarse wheeze deeper in but overall moving good air ? ?Leg swelling is controlled. Continue compression and fluid pill ? ?Fluid restriction 40-60 oz per day water. ? ?Leg ulcer at Wound Care is next  ? ?Please schedule a Follow-up Appointment to: Return if symptoms worsen or fail to improve. ? ?If you have any other questions or concerns, please feel free to call the office or send a message through [place]. You may also schedule an earlier appointment if necessary. ? ?Additionally, you may be receiving a survey about your experience at our office within a few days to 1 week by e-mail or mail. We value your feedback. ? ?[name], DO ?[place] ?

## 2021-12-24 NOTE — Progress Notes (Signed)
? ?Subjective:  ? ? [name], female    DOB: [birth year], 88 y.o.   MRN: [id number] ? ?Kristin [name] is a 88 y.o. female presenting on 12/24/2021 for Hospitalization Follow-up ? ? ?HPI ? ?HOSPITAL FOLLOW-UP VISIT ? ?Hospital/Location: [facility] ?Date of Admission: 12/17/21 ?Date of Discharge: 12/19/21 ?Transitions of care telephone call: Completed by Kirke Shaggy LPN on 12/26/71 ? ?Reason for Admission: Pneumonia ? ?- Hospital H&P and Discharge Summary have been reviewed ?- Patient presents today 5 days after recent hospitalization. Brief summary of recent course, patient had symptoms of pneumonia, hospitalized, treated with antibiotics breathing treatment, diuretic for fluid. ? ?- Followed by Palliative Care, [facility] - Christin Gusler NP. Monitoring protein calorie malnutrition, debility secondary to small cell lung cancer. ? ?- Today reports overall has done well after discharge. Symptoms of breathing have improved ? ?- New medications on discharge: Augmentin antibiotic finish today ? ?I have reviewed the discharge medication list, and have reconciled the current and discharge medications today. ? ? ?Current Outpatient Medications:  ?  acetaminophen (TYLENOL) 500 MG tablet, Take 500 mg by mouth in the morning and at bedtime., Disp: , Rfl:  ?  albuterol (PROVENTIL) (2.5 MG/3ML) 0.083% nebulizer solution, USE 1 VIAL IN NEBULIZER EVERY 8 HOURS AS NEEDED FOR WHEEZING OR SHORTNESS OF BREATH., Disp: 90 mL, Rfl: 0 ?  apixaban (ELIQUIS) 2.5 MG TABS tablet, Take 1 tablet (2.5 mg total) by mouth 2 (two) times daily., Disp: 60 tablet, Rfl: 0 ?  atorvastatin (LIPITOR) 20 MG tablet, TAKE 1 TABLET BY MOUTH ONCE DAILY FOR CHOLESTEROL, Disp: 30 tablet, Rfl: 11 ?  bisacodyl (DULCOLAX) 5 MG EC tablet, Take 2 tablets (10 mg total) by mouth daily as needed for moderate constipation., Disp: 180 tablet, Rfl: 1 ?  BREZTRI AEROSPHERE 160-9-4.8 MCG/ACT AERO, Inhale 2 puffs into the lungs 2 (two) times daily., Disp: 10.7 g, Rfl:  5 ?  Cholecalciferol (VITAMIN D) 2000 units tablet, Take 2,000 Units by mouth daily. , Disp: , Rfl:  ?  diclofenac Sodium (VOLTAREN) 1 % GEL, Apply 4 g topically 2 (two) times daily as needed (As needed for pain)., Disp: 100 g, Rfl: 1 ?  empagliflozin (JARDIANCE) 10 MG TABS tablet, Take 1 tablet (10 mg total) by mouth daily before breakfast., Disp: 14 tablet, Rfl: 0 ?  feeding supplement (ENSURE ENLIVE / ENSURE PLUS) LIQD, Take 237 mLs by mouth 2 (two) times daily between meals., Disp: 237 mL, Rfl: 12 ?  furosemide (LASIX) 40 MG tablet, Take 1 tablet (40 mg total) by mouth daily., Disp: 30 tablet, Rfl: 1 ?  gabapentin (NEURONTIN) 300 MG capsule, Take 1 capsule (300 mg total) by mouth 3 (three) times daily., Disp: 90 capsule, Rfl: 1 ?  GNP ASPIRIN LOW DOSE 81 MG EC tablet, TAKE 1 TABLET BY MOUTH ONCE DAILY. *SWALLOW WHOLE*, Disp: 150 tablet, Rfl: 11 ?  levocetirizine (XYZAL) 5 MG tablet, TAKE (1/2) TABLET BY MOUTH EVERY OTHER DAY., Disp: 8 tablet, Rfl: 9 ?  Melatonin 10 MG TABS, Take 10 mg by mouth at bedtime., Disp: , Rfl:  ?  Multiple Vitamin (MULTIVITAMIN WITH MINERALS) TABS tablet, Take 1 tablet by mouth daily., Disp: 30 tablet, Rfl: 1 ?  oxyCODONE (OXY IR/ROXICODONE) 5 MG immediate release tablet, Take 2 tablets (10 mg total) by mouth 2 (two) times daily. May also take 1 tablet (5 mg total) at bedtime as needed for severe pain. Must last 30 days.., Disp: 150 tablet, Rfl: 0 ?  [START ON  01/22/2022] oxyCODONE (OXY IR/ROXICODONE) 5 MG immediate release tablet, Take 2 tablets (10 mg total) by mouth 2 (two) times daily. May also take 1 tablet (5 mg total) at bedtime as needed for severe pain. Must last 30 days.., Disp: 150 tablet, Rfl: 0 ?  [START ON 02/21/2022] oxyCODONE (OXY IR/ROXICODONE) 5 MG immediate release tablet, Take 2 tablets (10 mg total) by mouth 2 (two) times daily. May also take 1 tablet (5 mg total) at bedtime as needed for severe pain. Must last 30 days.., Disp: 150 tablet, Rfl: 0 ?  OXYGEN, Inhale 2 L  into the lungs daily., Disp: , Rfl:  ?  rOPINIRole (REQUIP) 2 MG tablet, TAKE 2 TABLETS BY MOUTH AT BEDTIME, Disp: 180 tablet, Rfl: 1 ? ?------------------------------------------------------------------------- ?Social History  ? ?Tobacco Use  ? Smoking status: Former  ?  Packs/day: 1.25  ?  Years: 40.00  ?  Pack years: 50.00  ?  Types: Cigarettes  ?  Quit date: 12/16/1998  ?  Years since quitting: 23.0  ? Smokeless tobacco: Never  ?Vaping Use  ? Vaping Use: Never used  ?Substance Use Topics  ? Alcohol use: No  ?  Alcohol/week: 0.0 standard drinks  ? Drug use: Yes  ?  Types: Oxycodone  ? ? ?Review of Systems ?Per HPI unless specifically indicated above ? ?   ?Objective:  ?  ?BP 130/80   Pulse 67   Ht 5\' 7"  (1.702 m)   Wt 171 lb (77.6 kg)   SpO2 95%   BMI 26.78 kg/m?   ?Wt Readings from Last 3 Encounters:  ?12/24/21 171 lb (77.6 kg)  ?12/19/21 171 lb 4.8 oz (77.7 kg)  ?12/16/21 158 lb (71.7 kg)  ?  ?Physical Exam ?Vitals and nursing note reviewed.  ?Constitutional:   ?   General: She is not in acute distress. ?   Appearance: She is well-developed. She is not diaphoretic.  ?   Comments: Well-appearing elderly 88 year female, comfortable, cooperative  ?HENT:  ?   Head: Normocephalic and atraumatic.  ?Eyes:  ?   General:     ?   Right eye: No discharge.     ?   Left eye: No discharge.  ?   Conjunctiva/sclera: Conjunctivae normal.  ?Neck:  ?   Thyroid: No thyromegaly.  ?Cardiovascular:  ?   Rate and Rhythm: Normal rate and regular rhythm.  ?   Heart sounds: Normal heart sounds. No murmur heard. ?Pulmonary:  ?   Effort: Pulmonary effort is normal. No respiratory distress.  ?   Breath sounds: Normal breath sounds. No wheezing or rales.  ?Musculoskeletal:     ?   General: Normal range of motion.  ?   Cervical back: Normal range of motion and neck supple.  ?   Right lower leg: Edema (+1 to trace pitting edema bilateral, stable to improved.) present.  ?   Left lower leg: Edema present.  ?   Comments: Legs non tender today   ?Lymphadenopathy:  ?   Cervical: No cervical adenopathy.  ?Skin: ?   General: Skin is warm and dry.  ?   Findings: No erythema or rash.  ?Neurological:  ?   Mental Status: She is alert and oriented to person, place, and time.  ?Psychiatric:     ?   Behavior: Behavior normal.  ?   Comments: Well groomed, good eye contact, normal speech and thoughts  ? ? ? ? ?Results for orders placed or performed in visit on 12/22/21  ?  [employer] Session Summary  ?Result Value Ref Range  ? Course ID [email or web address]   ? Course Intent Curative   ? Course Start Date 12/01/2021  2:38 PM   ? Session Number 10   ? Course First Treatment Date 12/08/2021  1:19 PM   ? Course Last Treatment Date 12/22/2021  1:35 PM   ? Course Elapsed Days 14   ? Reference Point ID Lung UHRT DP   ? Reference Point Dosage Given to Date 82.5003704 Gy  ? Reference Point Session Dosage Given 8.88916945 Gy  ? Plan ID Lung_R_UHRT1   ? Plan Fractions Treated to Date 10   ? Plan Total Fractions Prescribed 10   ? Plan Prescribed Dose Per Fraction 6 Gy  ? Plan Total Prescribed Dose 60.000000 Gy  ? Plan Primary Reference Point Lung UHRT DP   ? ?   ?Assessment & Plan:  ? ?Problem List Items Addressed This Visit   ? ? Varicose veins of left lower extremity with inflammation, with ulcer of ankle limited to breakdown of skin ([place])  ? Chronic diastolic CHF (congestive heart failure) ([place])  ? RESOLVED: CAP (community acquired pneumonia)  ?  Resolved ? ?  ?  ? Benign hypertension with CKD (chronic kidney disease) stage III (HCC) - Primary  ?  ?CAP Resolved ?Finish antibiotics today. ?Lungs mostly clear still hear some coarse wheeze deeper in but overall moving good air ?Add Mucinex Syrup OTC with DM cough suppressant ? ?Leg swelling is controlled. Continue compression and fluid pill ?Fluid restriction 40-60 oz per day water. ?Leg ulcer at Wound Care is next  ? ? ?No orders of the defined types were placed in this encounter. ? ? ?Follow up plan: ?Return if symptoms worsen or fail to  improve. ? ? ?Kristin Putnam, DO ?[facility] ?[facility] ?12/24/2021, 11:33 AM ?

## 2021-12-24 NOTE — Assessment & Plan Note (Signed)
Resolved

## 2021-12-25 ENCOUNTER — Encounter: Payer: Medicare HMO | Attending: Physician Assistant | Admitting: Physician Assistant

## 2021-12-25 ENCOUNTER — Telehealth: Payer: Self-pay | Admitting: Family Medicine

## 2021-12-25 ENCOUNTER — Ambulatory Visit (INDEPENDENT_AMBULATORY_CARE_PROVIDER_SITE_OTHER): Payer: Medicare HMO

## 2021-12-25 VITALS — Wt 171.0 lb

## 2021-12-25 DIAGNOSIS — I7389 Other specified peripheral vascular diseases: Secondary | ICD-10-CM | POA: Diagnosis not present

## 2021-12-25 DIAGNOSIS — G9009 Other idiopathic peripheral autonomic neuropathy: Secondary | ICD-10-CM | POA: Diagnosis not present

## 2021-12-25 DIAGNOSIS — Z87891 Personal history of nicotine dependence: Secondary | ICD-10-CM | POA: Insufficient documentation

## 2021-12-25 DIAGNOSIS — Z923 Personal history of irradiation: Secondary | ICD-10-CM | POA: Diagnosis not present

## 2021-12-25 DIAGNOSIS — Z0000 Encounter for general adult medical examination without abnormal findings: Secondary | ICD-10-CM

## 2021-12-25 DIAGNOSIS — Z7901 Long term (current) use of anticoagulants: Secondary | ICD-10-CM | POA: Insufficient documentation

## 2021-12-25 DIAGNOSIS — Z9221 Personal history of antineoplastic chemotherapy: Secondary | ICD-10-CM | POA: Diagnosis not present

## 2021-12-25 DIAGNOSIS — Z8619 Personal history of other infectious and parasitic diseases: Secondary | ICD-10-CM | POA: Insufficient documentation

## 2021-12-25 DIAGNOSIS — L97328 Non-pressure chronic ulcer of left ankle with other specified severity: Secondary | ICD-10-CM | POA: Diagnosis not present

## 2021-12-25 DIAGNOSIS — I509 Heart failure, unspecified: Secondary | ICD-10-CM | POA: Insufficient documentation

## 2021-12-25 DIAGNOSIS — I10 Essential (primary) hypertension: Secondary | ICD-10-CM | POA: Diagnosis not present

## 2021-12-25 DIAGNOSIS — I87322 Chronic venous hypertension (idiopathic) with inflammation of left lower extremity: Secondary | ICD-10-CM | POA: Diagnosis not present

## 2021-12-25 DIAGNOSIS — N183 Chronic kidney disease, stage 3 unspecified: Secondary | ICD-10-CM | POA: Insufficient documentation

## 2021-12-25 DIAGNOSIS — I130 Hypertensive heart and chronic kidney disease with heart failure and stage 1 through stage 4 chronic kidney disease, or unspecified chronic kidney disease: Secondary | ICD-10-CM | POA: Diagnosis not present

## 2021-12-25 DIAGNOSIS — I480 Paroxysmal atrial fibrillation: Secondary | ICD-10-CM | POA: Diagnosis not present

## 2021-12-25 NOTE — Telephone Encounter (Signed)
Message sent to [facility] via [email or web address] to find out if a new order needs to be submited for the  Gel Overlay for the Hospital bed or can it be added to existing order. ?

## 2021-12-25 NOTE — Progress Notes (Signed)
Miedema, Pasty L. ([id number]) ?Visit Report for 12/25/2021 ?Abuse Risk Screen Details ?Patient Name: Kristin [name], [name] ?Date of Service: 12/25/2021 8:45 AM ?Medical Record Number: [id number] ?Patient Account Number: [id number] ?Date of Birth/Sex: Apr 26, 1930 (88 y.o. F) ?Treating RN: Cornell Barman ?Primary Care Paticia Moster: [name] Other Clinician: ?Referring Briscoe Daniello: Referral, Self ?Treating Leilyn Frayre/Extender: Jeri Cos ?Weeks in Treatment: 0 ?Abuse Risk Screen Items ?Answer ?ABUSE RISK SCREEN: ?Has anyone close to you tried to hurt or harm you recentlyo No ?Do you feel uncomfortable with anyone in your familyo No ?Has anyone forced you do things that you didnot want to doo No ?Electronic Signature(s) ?Signed: 12/25/2021 4:31:29 PM By: Gretta Cool, BSN, RN, CWS, Kim RN, BSN ?Entered By: Gretta Cool, BSN, RN, CWS, Kim on 12/25/2021 09:03:52 ?Mccaster, Angila L. ([id number]) ?-------------------------------------------------------------------------------- ?Activities of Daily Living Details ?Patient Name: Kristin [name], [name] ?Date of Service: 12/25/2021 8:45 AM ?Medical Record Number: [id number] ?Patient Account Number: [id number] ?Date of Birth/Sex: March 10, 1930 (88 y.o. F) ?Treating RN: Cornell Barman ?Primary Care Tobechukwu Emmick: [name] Other Clinician: ?Referring Terrionna Bridwell: Referral, Self ?Treating Kienan Doublin/Extender: Jeri Cos ?Weeks in Treatment: 0 ?Activities of Daily Living Items ?Answer ?Activities of Daily Living (Please select one for each item) ?Drive Automobile Not Able ?Take Medications Need Assistance ?Use Telephone Completely Able ?Care for Appearance Completely Able ?Use Toilet Completely Able ?Bath / Shower Completely Able ?Dress Self Completely Able ?Feed Self Completely Able ?Walk Completely Able ?Get In / Out Bed Completely Able ?Housework Completely Able ?Prepare Meals Completely Able ?Handle Money Completely Able ?Shop for Self Completely Able ?Electronic Signature(s) ?Signed: 12/25/2021 4:31:29 PM By: Gretta Cool, BSN, RN,  CWS, Kim RN, BSN ?Entered By: Gretta Cool, BSN, RN, CWS, Kim on 12/25/2021 09:04:21 ?Hefferan, Alize L. ([id number]) ?-------------------------------------------------------------------------------- ?Education Screening Details ?Patient Name: Kristin [name], [name] ?Date of Service: 12/25/2021 8:45 AM ?Medical Record Number: [id number] ?Patient Account Number: [id number] ?Date of Birth/Sex: February 11, 1930 (88 y.o. F) ?Treating RN: Cornell Barman ?Primary Care Horace Lukas: [name] Other Clinician: ?Referring Jesenia Spera: Referral, Self ?Treating Gedeon Brandow/Extender: Jeri Cos ?Weeks in Treatment: 0 ?Primary Learner Assessed: Patient ?Learning Preferences/Education Level/Primary Language ?Learning Preference: Explanation, Demonstration ?Preferred Language: English ?Cognitive Barrier ?Language Barrier: No ?Translator Needed: No ?Memory Deficit: No ?Emotional Barrier: No ?Physical Barrier ?Impaired Vision: No ?Impaired Hearing: No ?Decreased Hand dexterity: No ?Knowledge/Comprehension ?Knowledge Level: High ?Comprehension Level: High ?Ability to understand written instructions: High ?Ability to understand verbal instructions: High ?Motivation ?Anxiety Level: Calm ?Cooperation: Cooperative ?Education Importance: Acknowledges Need ?Interest in Health Problems: Asks Questions ?Perception: Coherent ?Willingness to Engage in Self-Management ?High ?Activities: ?Readiness to Engage in Self-Management ?High ?Activities: ?Electronic Signature(s) ?Signed: 12/25/2021 4:31:29 PM By: Gretta Cool, BSN, RN, CWS, Kim RN, BSN ?Entered By: Gretta Cool, BSN, RN, CWS, Kim on 12/25/2021 09:04:44 ?Kristin [name], [name] ([id number]) ?-------------------------------------------------------------------------------- ?Fall Risk Assessment Details ?Patient Name: Kristin [name], [name] ?Date of Service: 12/25/2021 8:45 AM ?Medical Record Number: [id number] ?Patient Account Number: [id number] ?Date of Birth/Sex: 1929/09/25 (88 y.o. F) ?Treating RN: Cornell Barman ?Primary Care Norvil Martensen: [name] Other Clinician: ?Referring Artin Mceuen: Referral, Self ?Treating Osei Anger/Extender: Jeri Cos ?Weeks in Treatment: 0 ?Fall Risk Assessment Items ?Have you had 2 or more falls in the last 12 monthso 0 No ?Have you had any fall that resulted in injury in the last 12 monthso 0 No ?FALLS RISK SCREEN ?History of falling - immediate or within 3 months 0 No ?Secondary diagnosis (Do you have 2 or more medical diagnoseso) 0 No ?Ambulatory aid ?None/bed rest/wheelchair/nurse 0 No ?Crutches/cane/walker 15 Yes ?Furniture 0 No ?Intravenous therapy Access/Saline/Heparin Lock 0 No ?Gait/Transferring ?  Normal/ bed rest/ wheelchair 0 No ?Weak (short steps with or without shuffle, stooped but able to lift head while walking, may ?10 Yes ?seek support from furniture) ?Impaired (short steps with shuffle, may have difficulty arising from chair, head down, impaired ?0 No ?balance) ?Mental Status ?Oriented to own ability 0 Yes ?Electronic Signature(s) ?Signed: 12/25/2021 4:31:29 PM By: Gretta Cool, BSN, RN, CWS, Kim RN, BSN ?Entered By: Gretta Cool, BSN, RN, CWS, Kim on 12/25/2021 09:05:26 ?Kristin [name], [name] ([id number]) ?-------------------------------------------------------------------------------- ?Foot Assessment Details ?Patient Name: Kristin [name], [name] ?Date of Service: 12/25/2021 8:45 AM ?Medical Record Number: [id number] ?Patient Account Number: [id number] ?Date of Birth/Sex: 1930-02-03 (88 y.o. F) ?Treating RN: Cornell Barman ?Primary Care Symphani Eckstrom: [name] Other Clinician: ?Referring Enid Maultsby: Referral, Self ?Treating Aviv Lengacher/Extender: Jeri Cos ?Weeks in Treatment: 0 ?Foot Assessment Items ?Site Locations ?+ = Sensation present, - = Sensation absent, C = Callus, U = Ulcer ?R = Redness, W = Warmth, M = Maceration, PU = Pre-ulcerative lesion ?F = Fissure, S = Swelling, D = Dryness ?Assessment ?Right: Left: ?Other Deformity: No No ?Prior Foot Ulcer: No No ?Prior Amputation: No No ?Charcot Joint: No No ?Ambulatory Status: Ambulatory  With Help ?Assistance Device: Walker ?Gait: Steady ?Electronic Signature(s) ?Signed: 12/25/2021 4:31:29 PM By: Gretta Cool, BSN, RN, CWS, Kim RN, BSN ?Entered By: Gretta Cool, BSN, RN, CWS, Kim on 12/25/2021 09:05:55 ?Kristin [name], [name] ([id number]) ?-------------------------------------------------------------------------------- ?Nutrition Risk Screening Details ?Patient Name: Kristin [name], [name] ?Date of Service: 12/25/2021 8:45 AM ?Medical Record Number: [id number] ?Patient Account Number: [id number] ?Date of Birth/Sex: 01-08-1930 (88 y.o. F) ?Treating RN: Cornell Barman ?Primary Care Tarick Parenteau: [name] Other Clinician: ?Referring Shaw Dobek: Referral, Self ?Treating Jeramyah Goodpasture/Extender: Jeri Cos ?Weeks in Treatment: 0 ?Height (in): 67 ?Weight (lbs): 159 ?Body Mass Index (BMI): 24.9 ?Nutrition Risk Screening Items ?Score Screening ?NUTRITION RISK SCREEN: ?I have an illness or condition that made me change the kind and/or amount of food I eat 0 No ?I eat fewer than two meals per day 0 No ?I eat few fruits and vegetables, or milk products 0 No ?I have three or more drinks of beer, liquor or wine almost every day 0 No ?I have tooth or mouth problems that make it hard for me to eat 0 No ?I don't always have enough money to buy the food I need 0 No ?I eat alone most of the time 0 No ?I take three or more different prescribed or over-the-counter drugs a day 1 Yes ?Without wanting to, I have lost or gained 10 pounds in the last six months 0 No ?I am not always physically able to shop, cook and/or feed myself 0 No ?Nutrition Protocols ?Good Risk Protocol 0 No interventions needed ?Moderate Risk Protocol ?High Risk Proctocol ?Risk Level: Good Risk ?Score: 1 ?Electronic Signature(s) ?Signed: 12/25/2021 4:31:29 PM By: Gretta Cool, BSN, RN, CWS, Kim RN, BSN ?Entered By: Gretta Cool, BSN, RN, CWS, Kim on 12/25/2021 09:05:41 ?

## 2021-12-25 NOTE — Progress Notes (Signed)
?Virtual Visit via Telephone Note ? ?I connected with  Kristin [name] on 12/25/21 at  9:45 AM EDT by telephone and verified that I am speaking with the correct person using two identifiers. Spoke with grand daughter Kristin [name] ? ?Location: ?Patient: home ?Provider: [facility] ?Persons participating in the virtual visit: patient/Nurse Health Advisor ?  ?I discussed the limitations, risks, security and privacy concerns of performing an evaluation and management service by telephone and the availability of in person appointments. The patient expressed understanding and agreed to proceed. ? ?Interactive audio and video telecommunications were attempted between this nurse and patient, however failed, due to patient having technical difficulties OR patient did not have access to video capability.  We continued and completed visit with audio only. ? ?Some vital signs may be absent or patient reported.  ? ?Kristin David, LPN ? ?Subjective:  ? Kristin [name] is a 88 y.o. female who presents for Medicare Annual (Subsequent) preventive examination. ? ?Review of Systems    ? ?Cardiac Risk Factors include: advanced age (>55men, >50 women);hypertension;dyslipidemia ? ?   ?Objective:  ?  ?Today's Vitals  ? 12/25/21 0930  ?PainSc: 0-No pain  ? ?There is no height or weight on file to calculate BMI. ? ? ?  12/25/2021  ?  9:36 AM 12/19/2021  ? 11:56 AM 12/17/2021  ?  8:59 PM 12/02/2021  ?  2:59 PM 11/25/2021  ?  9:18 AM 11/20/2021  ?  8:20 AM 11/17/2021  ?  9:14 AM  ?Advanced Directives  ?Does Patient Have a Medical Advance Directive? Yes  No No No No No  ?Type of Paramedic of Attorney        ?Does patient want to make changes to medical advance directive? Yes (Inpatient - patient defers changing a medical advance directive and declines information at this time)        ?Copy of [place] in Chart? No - copy requested        ?Would patient like information on creating a medical advance directive? No - Patient  declined No - Patient declined  No - Patient declined No - Patient declined    ? ? ?Current Medications (verified) ?Outpatient Encounter Medications as of 12/25/2021  ?Medication Sig  ? acetaminophen (TYLENOL) 500 MG tablet Take 500 mg by mouth in the morning and at bedtime.  ? albuterol (PROVENTIL) (2.5 MG/3ML) 0.083% nebulizer solution USE 1 VIAL IN NEBULIZER EVERY 8 HOURS AS NEEDED FOR WHEEZING OR SHORTNESS OF BREATH.  ? apixaban (ELIQUIS) 2.5 MG TABS tablet Take 1 tablet (2.5 mg total) by mouth 2 (two) times daily.  ? atorvastatin (LIPITOR) 20 MG tablet TAKE 1 TABLET BY MOUTH ONCE DAILY FOR CHOLESTEROL  ? bisacodyl (DULCOLAX) 5 MG EC tablet Take 2 tablets (10 mg total) by mouth daily as needed for moderate constipation.  ? BREZTRI AEROSPHERE 160-9-4.8 MCG/ACT AERO Inhale 2 puffs into the lungs 2 (two) times daily.  ? Cholecalciferol (VITAMIN D) 2000 units tablet Take 2,000 Units by mouth daily.   ? diclofenac Sodium (VOLTAREN) 1 % GEL Apply 4 g topically 2 (two) times daily as needed (As needed for pain).  ? empagliflozin (JARDIANCE) 10 MG TABS tablet Take 1 tablet (10 mg total) by mouth daily before breakfast.  ? feeding supplement (ENSURE ENLIVE / ENSURE PLUS) LIQD Take 237 mLs by mouth 2 (two) times daily between meals.  ? furosemide (LASIX) 40 MG tablet Take 1 tablet (40 mg total) by mouth daily.  ?  gabapentin (NEURONTIN) 300 MG capsule Take 1 capsule (300 mg total) by mouth 3 (three) times daily.  ? GNP ASPIRIN LOW DOSE 81 MG EC tablet TAKE 1 TABLET BY MOUTH ONCE DAILY. *SWALLOW WHOLE*  ? levocetirizine (XYZAL) 5 MG tablet TAKE (1/2) TABLET BY MOUTH EVERY OTHER DAY.  ? Melatonin 10 MG TABS Take 10 mg by mouth at bedtime.  ? Multiple Vitamin (MULTIVITAMIN WITH MINERALS) TABS tablet Take 1 tablet by mouth daily.  ? oxyCODONE (OXY IR/ROXICODONE) 5 MG immediate release tablet Take 2 tablets (10 mg total) by mouth 2 (two) times daily. May also take 1 tablet (5 mg total) at bedtime as needed for severe pain. Must  last 30 days..  ? [START ON 01/22/2022] oxyCODONE (OXY IR/ROXICODONE) 5 MG immediate release tablet Take 2 tablets (10 mg total) by mouth 2 (two) times daily. May also take 1 tablet (5 mg total) at bedtime as needed for severe pain. Must last 30 days..  ? [START ON 02/21/2022] oxyCODONE (OXY IR/ROXICODONE) 5 MG immediate release tablet Take 2 tablets (10 mg total) by mouth 2 (two) times daily. May also take 1 tablet (5 mg total) at bedtime as needed for severe pain. Must last 30 days..  ? OXYGEN Inhale 2 L into the lungs daily.  ? rOPINIRole (REQUIP) 2 MG tablet TAKE 2 TABLETS BY MOUTH AT BEDTIME  ? ?No facility-administered encounter medications on file as of 12/25/2021.  ? ? ?Allergies (verified) ?Flexeril [cyclobenzaprine hcl] and Vancomycin  ? ?History: ?Past Medical History:  ?Diagnosis Date  ? Acute postoperative pain 02/09/2018  ? Arrhythmia, sinus node 04/03/2014  ? Arthritis   ? Arthritis   ? Atrial fibrillation ([place])   ? Back pain   ? lower back chronic  ? Bradycardia   ? Breast cancer ([place]) 2004  ? right breast cancer  ? Cancer [facility]) 2004  ? rt breast cancer-post lumpectomy- chemo/rad  ? CHF (congestive heart failure) ([place])   ? Chronic back pain   ? CKD (chronic kidney disease)   ? COPD (chronic obstructive pulmonary disease) ([place])   ? wears O2 at 2L via [place] at night  ? COVID-19 06/11/2020  ? Cystocele   ? Decubitus ulcers   ? Dehydration   ? Gout   ? Hematuria   ? Hepatitis C   ? Hyperlipidemia   ? Hypertension   ? Hypomagnesemia 06/25/2015  ? NSTEMI (non-ST elevated myocardial infarction) ([place]) 04/08/2021  ? OAB (overactive bladder)   ? Overactive bladder   ? Overdose opiate, accidental or unintentional, sequela 10/02/2020  ? Restless leg   ? Shoulder pain, bilateral   ? Urinary frequency   ? UTI (lower urinary tract infection)   ? Vaginal atrophy   ? ?Past Surgical History:  ?Procedure Laterality Date  ? ABDOMINAL HYSTERECTOMY    ? BACK SURGERY    ? BREAST LUMPECTOMY Right 2004  ? BREAST SURGERY    ? rt lumpectomy   ? CARPAL TUNNEL RELEASE    ? CATARACT EXTRACTION W/PHACO  10/19/2011  ? Procedure: CATARACT EXTRACTION PHACO AND INTRAOCULAR LENS PLACEMENT (IOC);  Surgeon: Elta Guadeloupe T. Gershon Crane, MD;  Location: [facility];  Service: Ophthalmology;  Laterality: Right;  CDE:10.81  ? CATARACT EXTRACTION W/PHACO  11/02/2011  ? Procedure: CATARACT EXTRACTION PHACO AND INTRAOCULAR LENS PLACEMENT (IOC);  Surgeon: Elta Guadeloupe T. Gershon Crane, MD;  Location: [facility];  Service: Ophthalmology;  Laterality: Left;  CDE 8.60  ? CHOLECYSTECTOMY    ? 3/18  ? COLON SURGERY    ?  INSERT / REPLACE / REMOVE PACEMAKER    ? JOINT REPLACEMENT    ? bilateral TKA  ? LOWER EXTREMITY ANGIOGRAPHY Left 10/14/2020  ? Procedure: LOWER EXTREMITY ANGIOGRAPHY;  Surgeon: Katha Cabal, MD;  Location: [place] [facility];  Service: Cardiovascular;  Laterality: Left;  ? LOWER EXTREMITY ANGIOGRAPHY Right 09/08/2021  ? Procedure: LOWER EXTREMITY ANGIOGRAPHY;  Surgeon: Katha Cabal, MD;  Location: [place] [facility];  Service: Cardiovascular;  Laterality: Right;  ? PACEMAKER INSERTION N/A 12/24/2015  ? Procedure: INSERTION PACEMAKER;  Surgeon: Isaias Cowman, MD;  Location: [facility];  Service: Cardiovascular;  Laterality: N/A;  ? ROTATOR CUFF REPAIR    ? bilateral  ? VIDEO BRONCHOSCOPY WITH ENDOBRONCHIAL NAVIGATION Right 11/11/2021  ? Procedure: VIDEO BRONCHOSCOPY WITH ENDOBRONCHIAL NAVIGATION;  Surgeon: Ottie Glazier, MD;  Location: [facility];  Service: Thoracic;  Laterality: Right;  ? ?Family History  ?Problem Relation Age of Onset  ? Kidney disease Brother   ? Heart disease Father   ? Heart disease Mother   ? Stroke Mother   ? Cancer Sister   ? Breast cancer Sister 78  ? Breast cancer Paternal Aunt   ? Anesthesia problems Neg Hx   ? Hypotension Neg Hx   ? Malignant hyperthermia Neg Hx   ? Pseudochol deficiency Neg Hx   ? ?Social History  ? ?Socioeconomic History  ? Marital status: Single  ?  Spouse name: Not on file  ? Number of children: 2  ? Years of education: 30  ? Highest  education level: 12th grade  ?Occupational History  ? Occupation: retired  ?Tobacco Use  ? Smoking status: Former  ?  Packs/day: 1.25  ?  Years: 40.00  ?  Pack years: 50.00  ?  Types: Cigarettes  ?  Q

## 2021-12-25 NOTE — Progress Notes (Addendum)
Canedo, Ferne L. ([id number]) ?Visit Report for 12/25/2021 ?Allergy List Details ?Patient Name: Kristin [name], [name] ?Date of Service: 12/25/2021 8:45 AM ?Medical Record Number: [id number] ?Patient Account Number: [id number] ?Date of Birth/Sex: 1930-07-26 (88 y.o. F) ?Treating RN: Cornell Barman ?Primary Care Bradley Bostelman: [name] Other Clinician: ?Referring Abeera Flannery: Referral, Self ?Treating Candida Vetter/Extender: Jeri Cos ?Weeks in Treatment: 0 ?Allergies ?Active Allergies ?vancomycin ?Reaction: rash ?Severity: Moderate ?Flexeril ?Reaction: rash ?Severity: Moderate ?Allergy Notes ?Electronic Signature(s) ?Signed: 12/25/2021 4:31:29 PM By: Gretta Cool, BSN, RN, CWS, Kim RN, BSN ?Entered By: Gretta Cool, BSN, RN, CWS, Kim on 12/25/2021 09:02:44 ?Kristin [name], [name] ([id number]) ?-------------------------------------------------------------------------------- ?Arrival Information Details ?Patient Name: Kristin [name], [name] ?Date of Service: 12/25/2021 8:45 AM ?Medical Record Number: [id number] ?Patient Account Number: [id number] ?Date of Birth/Sex: August 30, 1929 (88 y.o. F) ?Treating RN: Cornell Barman ?Primary Care Mishka Stegemann: [name] Other Clinician: ?Referring Marybeth Dandy: Referral, Self ?Treating Irma Roulhac/Extender: Jeri Cos ?Weeks in Treatment: 0 ?Visit Information ?Patient Arrived: Wheel Chair ?Arrival Time: 08:49 ?Accompanied By: son in lobby ?Transfer Assistance: None ?Patient Has Alerts: Yes ?Patient Alerts: Patient on Blood Thinner ?09/24/21 AVVS ABI/TBI ?ABI R)1.39 L) 1.17 ?TBI R)0.96 L) 1.15 ?Eliquis, 81 MG Aspirin ?History Since Last Visit ?Electronic Signature(s) ?Signed: 12/25/2021 4:31:29 PM By: Gretta Cool, BSN, RN, CWS, Kim RN, BSN ?Previous Signature: 12/25/2021 8:54:10 AM Version By: Gretta Cool, BSN, RN, CWS, Kim RN, BSN ?Entered By: Gretta Cool, BSN, RN, CWS, Kim on 12/25/2021 08:59:29 ?Kristin [name], [name] ([id number]) ?-------------------------------------------------------------------------------- ?Clinic Level of Care Assessment Details ?Patient Name:  Kristin [name], [name] ?Date of Service: 12/25/2021 8:45 AM ?Medical Record Number: [id number] ?Patient Account Number: [id number] ?Date of Birth/Sex: 10/23/1929 (88 y.o. F) ?Treating RN: Cornell Barman ?Primary Care Early Steel: [name] Other Clinician: ?Referring Atalya Dano: Referral, Self ?Treating Taysha Majewski/Extender: Jeri Cos ?Weeks in Treatment: 0 ?Clinic Level of Care Assessment Items ?TOOL 1 Quantity Score ?[]  - Use when EandM and Procedure is performed on INITIAL visit 0 ?ASSESSMENTS - Nursing Assessment / Reassessment ?[]  - General Physical Exam (combine w/ comprehensive assessment (listed just below) when performed on new ?0 ?pt. evals) ?[]  - 0 ?Comprehensive Assessment (HX, ROS, Risk Assessments, Wounds Hx, etc.) ?ASSESSMENTS - Wound and Skin Assessment / Reassessment ?[]  - Dermatologic / Skin Assessment (not related to wound area) 0 ?ASSESSMENTS - Ostomy and/or Continence Assessment and Care ?[]  - Incontinence Assessment and Management 0 ?[]  - 0 ?Ostomy Care Assessment and Management (repouching, etc.) ?PROCESS - Coordination of Care ?X - Simple Patient / Family Education for ongoing care 1 15 ?[]  - 0 ?Complex (extensive) Patient / Family Education for ongoing care ?X- 1 10 ?Staff obtains Consents, Records, Test Results / Process Orders ?[]  - 0 ?Staff telephones HHA, Nursing Homes / Clarify orders / etc ?[]  - 0 ?Routine Transfer to another Facility (non-emergent condition) ?[]  - 0 ?Routine Hospital Admission (non-emergent condition) ?X- 1 15 ?New Admissions / Biomedical engineer / Ordering NPWT, Apligraf, etc. ?[]  - 0 ?Emergency Hospital Admission (emergent condition) ?PROCESS - Special Needs ?[]  - Pediatric / Minor Patient Management 0 ?[]  - 0 ?Isolation Patient Management ?[]  - 0 ?Hearing / Language / Visual special needs ?[]  - 0 ?Assessment of Community assistance (transportation, D/C planning, etc.) ?[]  - 0 ?Additional assistance / Altered mentation ?[]  - 0 ?Support Surface(s) Assessment (bed,  cushion, seat, etc.) ?INTERVENTIONS - Miscellaneous ?[]  - External ear exam 0 ?[]  - 0 ?Patient Transfer (multiple staff / Civil Service fast streamer / Similar devices) ?[]  - 0 ?Simple Staple / Suture removal (25 or less) ?[]  - 0 ?Complex Staple / Suture removal (26 or  more) ?[]  - 0 ?Hypo/Hyperglycemic Management (do not check if billed separately) ?[]  - 0 ?Ankle / Brachial Index (ABI) - do not check if billed separately ?Has the patient been seen at the hospital within the last three years: Yes ?Total Score: 40 ?Level Of Care: New/Established - Level ?2 ?Kristin [name], [name] ([id number]) ?Electronic Signature(s) ?Signed: 12/25/2021 4:31:29 PM By: Gretta Cool, BSN, RN, CWS, Kim RN, BSN ?Entered By: Gretta Cool, BSN, RN, CWS, Kim on 12/25/2021 09:49:50 ?Kristin [name], [name] ([id number]) ?-------------------------------------------------------------------------------- ?Complex / Palliative Patient Assessment Details ?Patient Name: Kristin [name], [name] ?Date of Service: 12/25/2021 8:45 AM ?Medical Record Number: [id number] ?Patient Account Number: [id number] ?Date of Birth/Sex: 11-22-1929 (88 y.o. F) ?Treating RN: Cornell Barman ?Primary Care Carrisa Keller: [name] Other Clinician: ?Referring Medhansh Brinkmeier: Referral, Self ?Treating Loyalty Arentz/Extender: Jeri Cos ?Weeks in Treatment: 0 ?Complex Wound Management Criteria ?Palliative Wound Management Criteria ?Patient has a living will that specifies no extraordinary measures and Advanced Wound Management would expose the patient to those ?extraordinary interventions. ?Care Approach ?Wound Care Plan: Palliative Wound Management ?Electronic Signature(s) ?Signed: 12/25/2021 9:29:03 AM By: Gretta Cool, BSN, RN, CWS, Kim RN, BSN ?Signed: 12/25/2021 4:41:50 PM By: Worthy Keeler PA-C ?Entered By: Gretta Cool, BSN, RN, CWS, Kim on 12/25/2021 09:29:03 ?Kristin [name], [name] ([id number]) ?-------------------------------------------------------------------------------- ?Encounter Discharge Information Details ?Patient Name: Kristin [name], [name] ?Date of  Service: 12/25/2021 8:45 AM ?Medical Record Number: [id number] ?Patient Account Number: [id number] ?Date of Birth/Sex: 10-Oct-1929 (88 y.o. F) ?Treating RN: Cornell Barman ?Primary Care Cheyann Blecha: [name] Other Clinician: ?Referring Catherine Oak: Referral, Self ?Treating Savahna Casados/Extender: Jeri Cos ?Weeks in Treatment: 0 ?Encounter Discharge Information Items ?Discharge Condition: Stable ?Ambulatory Status: Ambulatory ?Discharge Destination: Home ?Transportation: Private Auto ?Accompanied By: son in lobby ?Schedule Follow-up Appointment: Yes ?Clinical Summary of Care: ?Electronic Signature(s) ?Signed: 12/25/2021 4:31:29 PM By: Gretta Cool, BSN, RN, CWS, Kim RN, BSN ?Entered By: Gretta Cool, BSN, RN, CWS, Kim on 12/25/2021 09:53:20 ?Kristin [name], [name] ([id number]) ?-------------------------------------------------------------------------------- ?Lower Extremity Assessment Details ?Patient Name: Kristin [name], [name] ?Date of Service: 12/25/2021 8:45 AM ?Medical Record Number: [id number] ?Patient Account Number: [id number] ?Date of Birth/Sex: June 27, 1930 (88 y.o. F) ?Treating RN: Cornell Barman ?Primary Care Sakiya Stepka: [name] Other Clinician: ?Referring Devanny Palecek: Referral, Self ?Treating Dallon Dacosta/Extender: Jeri Cos ?Weeks in Treatment: 0 ?Edema Assessment ?Assessed: [Left: No] [Right: No] ?[Left: Edema] [Right: :] ?Calf ?Left: Right: ?Point of Measurement: 33 cm From Medial Instep 37.6 cm ?Ankle ?Left: Right: ?Point of Measurement: 10 cm From Medial Instep 26 cm ?Knee To Floor ?Left: Right: ?From Medial Instep 45 cm ?Vascular Assessment ?Pulses: ?Dorsalis Pedis ?Palpable: [Left:Yes] ?Electronic Signature(s) ?Signed: 12/25/2021 4:31:29 PM By: Gretta Cool, BSN, RN, CWS, Kim RN, BSN ?Entered By: Gretta Cool, BSN, RN, CWS, Kim on 12/25/2021 09:11:55 ?Kristin [name], Ura L. ([id number]) ?-------------------------------------------------------------------------------- ?Multi Wound Chart Details ?Patient Name: Rauda, Jo-Ann L. ?Date of Service: 12/25/2021 8:45  AM ?Medical Record Number: [id number] ?Patient Account Number: [id number] ?Date of Birth/Sex: 1930-06-28 (88 y.o. F) ?Treating RN: Cornell Barman ?Primary Care Analee Montee: [name] Other Clinician: ?Referring Pro

## 2021-12-25 NOTE — Telephone Encounter (Signed)
Home Health Verbal Orders - Caller/Agency: [facility]/ Tressia Danas  ?Callback Number: [phone]/ vm can be left  ?Requesting PT ?Frequency: to continue care for 1x a week for 1 week and  ?2x's a week for 2 weeks  ? ?

## 2021-12-25 NOTE — Progress Notes (Signed)
Vayda, Mckynzi L. ([id number]) ?Visit Report for 12/25/2021 ?Chief Complaint Document Details ?Patient Name: Purnell, Suzi L. ?Date of Service: 12/25/2021 8:45 AM ?Medical Record Number: [id number] ?Patient Account Number: [id number] ?Date of Birth/Sex: 05-May-1930 (88 y.o. F) ?Treating RN: Cornell Barman ?Primary Care Provider: [name] Other Clinician: ?Referring Provider: Referral, Self ?Treating Provider/Extender: Jeri Cos ?Weeks in Treatment: 0 ?Information Obtained from: Patient ?Chief Complaint ?Left ankle ulcer ?Electronic Signature(s) ?Signed: 12/25/2021 9:35:20 AM By: Worthy Keeler PA-C ?Entered By: Worthy Keeler on 12/25/2021 09:35:20 ?Emanuele, Rynlee L. ([id number]) ?-------------------------------------------------------------------------------- ?HPI Details ?Patient Name: Shaub, Ellawyn L. ?Date of Service: 12/25/2021 8:45 AM ?Medical Record Number: [id number] ?Patient Account Number: [id number] ?Date of Birth/Sex: 03/27/1930 (88 y.o. F) ?Treating RN: Cornell Barman ?Primary Care Provider: [name] Other Clinician: ?Referring Provider: Referral, Self ?Treating Provider/Extender: Jeri Cos ?Weeks in Treatment: 0 ?History of Present Illness ?HPI Description: Left 0.81 ?Right 0.83 ?12/25/20 ?About a year but patient is not sure how long for sure ?01/06/2021 upon evaluation today patient presents for initial inspection here in our clinic concerning issues she has been having in 2 areas one ?being on her left lateral malleolus. This is the most significant of the 2 regions based on what I am seeing today. The other is actually over her left ?dorsal 3rd toe. With that being said the patient tells me that currently she has been tolerating the dressing changes without complication. There ?does not appear to be any signs of obvious infection at this time but nonetheless she does not seem to be doing a lot better. She is mainly just ?been putting a cream on the areas but she is not sure what cream this was when I  questioned her about that today she states "it may be on my ?med list" although I did not see that. The patient is really not a very good historian in general concerning how long this has been going on and ?what exactly is going on. ?The patient does have a history of neuropathy, chronic kidney disease, atrial fibrillation and is on long-term anticoagulant therapy as well as ?aspirin. This includes Eliquis. ?5/24; patient has an open area on the left lateral malleolus as well as an area on the base of her third toe which is mycotic also on the left. We ?have been using Hydrofera Blue as of last week. ?Biopsy that was done showed chronic stasis dermatitis with areas of healing erosion and overlying inflammatory crust. Negative for malignancy ?and negative for fungal organisms. X-ray of the foot did not show anything abnormal except degenerative changes ?6/1; left lateral malleolus wound had a lot of callus and dry skin on the surface. I remove this there is only 2 small open areas. The base of the ?left third toe at the base of the mycotic nail also required debridement we have been using silver alginate in both areas ?01/27/2021 upon evaluation today patient appears to be doing decently well in regard to her wounds. In fact she appears to be healed based on ?what I am seeing at this point. I do not see any signs of active infection at this time. ?Readmission: ?02/26/2021 upon evaluation today patient unfortunately has a reopening of her wound on her leg. She has on the left lateral ankle noted wound has ?been open for about a week. She tells me that she has not been wearing compression since we discharged her labs under the impression that she ?had compression stockings and would be wearing those. Nonetheless it does not appear  to be the case. I do believe this may be very likely why ?this reopen. Fortunately there does not appear to be any signs of active infection at this time. No fevers, chills, nausea, vomiting, or  diarrhea. ?03/05/2021 upon evaluation today patient appears to be doing well with regard to the wound on her ankle region. She has been tolerating the ?dressing changes without complication. Fortunately there is no evidence of active infection which is great news. ?03/13/2021 upon evaluation today patient appears to be doing well with regard to her wound. She has been tolerating the dressing changes ?without complication and very pleased in that regard. There does not appear to be any signs of active infection which is great news and overall I ?think the patient is making excellent progress. ?03/19/2021 upon evaluation today patient appears to be doing well with regard to her ankle ulcer. She has been tolerating the dressing changes ?without complication and overall I think she is actually making excellent progress. This is measuring significantly smaller today as compared to ?last time I saw her. ?04/02/2021 upon evaluation today patient appears to be doing well with regard to her ankle ulcer. She has been tolerating the dressing changes ?without complication. Fortunately there is no signs of active infection at this time. No fevers, chills, nausea, vomiting, or diarrhea. With that being ?said the patient tells me that she is doing well and is having no pain which is also great news. ?Readmission: ?12-25-2021 upon evaluation today patient appears to be having some issues with some irritation on her left lateral ankle. This is something that has ?been going on for a few weeks now based on what we are seeing. Fortunately there does not appear to be any evidence of infection although the ?patient has been treated by her granddaughter has been putting some ointment on it and keeping this wrapped up for her. Honestly I think she is ?done a great job up to this point. Fortunately there does not appear to be any evidence of active infection locally or systemically which is great ?news. ?Since I last saw her there have been a  few things that have gone on. She did have angiograms of both lower extremities earlier this year and then ?subsequently had a arterial study that was performed on 09/24/2021 which did reveal that her arterial flow was good following with a right ABI of ?1.39 with a TBI of 1.15 and on the left there was an ABI of 1.17 with a TBI of 0.96. Overall I am extremely pleased in this regard. Otherwise she ?has also been undergoing 10 radiation doses due to a spot that was noted on her lung. Overall she seems to be doing well otherwise. ?Electronic Signature(s) ?Garretson, Nusrat L. ([id number]) ?Signed: 12/25/2021 9:46:01 AM By: Worthy Keeler PA-C ?Entered By: Worthy Keeler on 12/25/2021 09:46:00 ?Matzke, Aften L. ([id number]) ?-------------------------------------------------------------------------------- ?Physical Exam Details ?Patient Name: Theall, Harumi L. ?Date of Service: 12/25/2021 8:45 AM ?Medical Record Number: [id number] ?Patient Account Number: [id number] ?Date of Birth/Sex: [birth year] (88 y.o. F) ?Treating RN: Cornell Barman ?Primary Care Provider: [name] Other Clinician: ?Referring Provider: Referral, Self ?Treating Provider/Extender: Jeri Cos ?Weeks in Treatment: 0 ?Constitutional ?sitting or standing blood pressure is within target range for patient.. pulse regular and within target range for patient.Marland Kitchen respirations regular, non- ?labored and within target range for patient.Marland Kitchen temperature within target range for patient.. Well-nourished and well-hydrated in no acute distress. ?Eyes ?conjunctiva clear no eyelid edema noted. pupils equal round and reactive to light  and accommodation. ?Respiratory ?normal breathing without difficulty. ?Cardiovascular ?Absent posterior tibial and dorsalis pedis pulses bilateral lower extremities. 1+ pitting edema of the bilateral lower extremities. ?Musculoskeletal ?Patient unable to walk without assistance. no significant deformity or arthritic changes, no loss or range of  motion, no clubbing. ?Psychiatric ?this patient is able to make decisions and demonstrates good insight into disease process. Alert and Oriented x 3. pleasant and cooperative. ?Notes ?Upon inspection patient

## 2021-12-25 NOTE — Telephone Encounter (Signed)
Okay to proceed w/ verbal orders. ? ?[name], DO ?[facility] ?[facility] ?12/25/2021, 4:22 PM ? ?

## 2021-12-25 NOTE — Patient Instructions (Signed)
Kristin [name] , ?Thank you for taking time to come for your Medicare Wellness Visit. I appreciate your ongoing commitment to your health goals. Please review the following plan we discussed and let me know if I can assist you in the future.  ? ?Screening recommendations/referrals: ?Colonoscopy: aged out ?Mammogram: aged out ?Bone Density: aged out ?Recommended yearly ophthalmology/optometry visit for glaucoma screening and checkup ?Recommended yearly dental visit for hygiene and checkup ? ?Vaccinations: ?Influenza vaccine: 07/07/21 ?Pneumococcal vaccine: 01/18/18 ?Tdap vaccine: 07/30/14 ?Shingles vaccine: Zostavax 06/17/15   ?Covid-19:09/17/19, 10/05/19, 09/02/20, 04/01/21 ? ?Advanced directives: yes ? ?Conditions/risks identified: no ? ?Next appointment: Follow up in one year for your annual wellness visit 12/31/22 @ 8:45am by phone ? ? ?Preventive Care 3 Years and Older, Female ?Preventive care refers to lifestyle choices and visits with your health care provider that can promote health and wellness. ?What does preventive care include? ?A yearly physical exam. This is also called an annual well check. ?Dental exams once or twice a year. ?Routine eye exams. Ask your health care provider how often you should have your eyes checked. ?Personal lifestyle choices, including: ?Daily care of your teeth and gums. ?Regular physical activity. ?Eating a healthy diet. ?Avoiding tobacco and drug use. ?Limiting alcohol use. ?Practicing safe sex. ?Taking low-dose aspirin every day. ?Taking vitamin and mineral supplements as recommended by your health care provider. ?What happens during an annual well check? ?The services and screenings done by your health care provider during your annual well check will depend on your age, overall health, lifestyle risk factors, and family history of disease. ?Counseling  ?Your health care provider may ask you questions about your: ?Alcohol use. ?Tobacco use. ?Drug use. ?Emotional well-being. ?Home and  relationship well-being. ?Sexual activity. ?Eating habits. ?History of falls. ?Memory and ability to understand (cognition). ?Work and work Statistician. ?Reproductive health. ?Screening  ?You may have the following tests or measurements: ?Height, weight, and BMI. ?Blood pressure. ?Lipid and cholesterol levels. These may be checked every 5 years, or more frequently if you are over 106 years old. ?Skin check. ?Lung cancer screening. You may have this screening every year starting at age 70 if you have a 30-pack-year history of smoking and currently smoke or have quit within the past 15 years. ?Fecal occult blood test (FOBT) of the stool. You may have this test every year starting at age 85. ?Flexible sigmoidoscopy or colonoscopy. You may have a sigmoidoscopy every 5 years or a colonoscopy every 10 years starting at age 30. ?Hepatitis C blood test. ?Hepatitis B blood test. ?Sexually transmitted disease (STD) testing. ?Diabetes screening. This is done by checking your blood sugar (glucose) after you have not eaten for a while (fasting). You may have this done every 1-3 years. ?Bone density scan. This is done to screen for osteoporosis. You may have this done starting at age 37. ?Mammogram. This may be done every 1-2 years. Talk to your health care provider about how often you should have regular mammograms. ?Talk with your health care provider about your test results, treatment options, and if necessary, the need for more tests. ?Vaccines  ?Your health care provider may recommend certain vaccines, such as: ?Influenza vaccine. This is recommended every year. ?Tetanus, diphtheria, and acellular pertussis (Tdap, Td) vaccine. You may need a Td booster every 10 years. ?Zoster vaccine. You may need this after age 65. ?Pneumococcal 13-valent conjugate (PCV13) vaccine. One dose is recommended after age 30. ?Pneumococcal polysaccharide (PPSV23) vaccine. One dose is recommended after age 38. ?Talk  to your health care provider  about which screenings and vaccines you need and how often you need them. ?This information is not intended to replace advice given to you by your health care provider. Make sure you discuss any questions you have with your health care provider. ?Document Released: 09/05/2015 Document Revised: 04/28/2016 Document Reviewed: 06/10/2015 ?Elsevier Interactive Patient Education ? 2017 [place] [employer]. ? ?Fall Prevention in the Home ?Falls can cause injuries. They can happen to people of all ages. There are many things you can do to make your home safe and to help prevent falls. ?What can I do on the outside of my home? ?Regularly fix the edges of walkways and driveways and fix any cracks. ?Remove anything that might make you trip as you walk through a door, such as a raised step or threshold. ?Trim any bushes or trees on the path to your home. ?Use bright outdoor lighting. ?Clear any walking paths of anything that might make someone trip, such as rocks or tools. ?Regularly check to see if handrails are loose or broken. Make sure that both sides of any steps have handrails. ?Any raised decks and porches should have guardrails on the edges. ?Have any leaves, snow, or ice cleared regularly. ?Use sand or salt on walking paths during winter. ?Clean up any spills in your garage right away. This includes oil or grease spills. ?What can I do in the bathroom? ?Use night lights. ?Install grab bars by the toilet and in the tub and shower. Do not use towel bars as grab bars. ?Use non-skid mats or decals in the tub or shower. ?If you need to sit down in the shower, use a plastic, non-slip stool. ?Keep the floor dry. Clean up any water that spills on the floor as soon as it happens. ?Remove soap buildup in the tub or shower regularly. ?Attach bath mats securely with double-sided non-slip rug tape. ?Do not have throw rugs and other things on the floor that can make you trip. ?What can I do in the bedroom? ?Use night lights. ?Make sure  that you have a light by your bed that is easy to reach. ?Do not use any sheets or blankets that are too big for your bed. They should not hang down onto the floor. ?Have a firm chair that has side arms. You can use this for support while you get dressed. ?Do not have throw rugs and other things on the floor that can make you trip. ?What can I do in the kitchen? ?Clean up any spills right away. ?Avoid walking on wet floors. ?Keep items that you use a lot in easy-to-reach places. ?If you need to reach something above you, use a strong step stool that has a grab bar. ?Keep electrical cords out of the way. ?Do not use floor polish or wax that makes floors slippery. If you must use wax, use non-skid floor wax. ?Do not have throw rugs and other things on the floor that can make you trip. ?What can I do with my stairs? ?Do not leave any items on the stairs. ?Make sure that there are handrails on both sides of the stairs and use them. Fix handrails that are broken or loose. Make sure that handrails are as long as the stairways. ?Check any carpeting to make sure that it is firmly attached to the stairs. Fix any carpet that is loose or worn. ?Avoid having throw rugs at the top or bottom of the stairs. If you do have throw rugs,  attach them to the floor with carpet tape. ?Make sure that you have a light switch at the top of the stairs and the bottom of the stairs. If you do not have them, ask someone to add them for you. ?What else can I do to help prevent falls? ?Wear shoes that: ?Do not have high heels. ?Have rubber bottoms. ?Are comfortable and fit you well. ?Are closed at the toe. Do not wear sandals. ?If you use a stepladder: ?Make sure that it is fully opened. Do not climb a closed stepladder. ?Make sure that both sides of the stepladder are locked into place. ?Ask someone to hold it for you, if possible. ?Clearly mark and make sure that you can see: ?Any grab bars or handrails. ?First and last steps. ?Where the edge of  each step is. ?Use tools that help you move around (mobility aids) if they are needed. These include: ?Canes. ?Walkers. ?Scooters. ?Crutches. ?Turn on the lights when you go into a dark area. Replace any ligh

## 2021-12-27 NOTE — Progress Notes (Signed)
[name], female    DOB: Aug 30, 1929, 88 y.o.   MRN: [id number] ? ?HPI ? ?Kristin [name] is a 88 y/o female with a history of atrial fibrillation, right breast cancer, chronic back pain, CKD, COPD, gout, hepatitis, hyperlipidemia, HTN, hypomagnesium and chronic heart failure ? ?Echo report from 05/24/20 reviewed and showed an EF of 60-65% along with mild LVH and moderate LAE. Reviewed echo report on 10/29/15 which showed an EF of 55-65% with moderate MR.  ? ?Admitted 12/17/21 due to AMS and sepsis. CT was negative for PE. Cardiology, medical oncology and wound consults obtained. Initially given IV lasix with transition to oral diuretics. Discharged after 2 days. Was in the ED 11/25/21 due to mechanical fall. Head CT was negative. IVF given for mild dehydration and she was released. Was in the ED 11/20/21 due to mechanical fall resulting in head laceration. Head CT negative. Laceration stapled and she was released.                               ? ?Patient presents today for a follow-up visit with a chief complaint of moderate shortness of breath with minimal exertion. She describes this as chronic in nature although feels like it's improving. She has associated fatigue, productive cough, wheezing, pedal edema and chronic pain along with this. She denies any difficulty sleeping, abdominal distention, palpitations, chest pain, dizziness or weight gain.  ? ?Biggest complaint today is of her productive cough and she would like something for this.  ? ?Not adding salt but admits to eating bacon or sausage 3 times/ week. She says she eats 2 slices of bacon but son says it's more like 4 slices. Will also have a canned biscuit with the bacon or sausage.  ? ?Wears her oxygen at 2L but did not wear it to the office. Son says that it is in the car.  ? ?Past Medical History:  ?Diagnosis Date  ? Acute postoperative pain 02/09/2018  ? Arrhythmia, sinus node 04/03/2014  ? Arthritis   ? Arthritis   ? Atrial fibrillation ([place])   ?  Back pain   ? lower back chronic  ? Bradycardia   ? Breast cancer ([place]) 2004  ? right breast cancer  ? Cancer [facility]) 2004  ? rt breast cancer-post lumpectomy- chemo/rad  ? CHF (congestive heart failure) ([place])   ? Chronic back pain   ? CKD (chronic kidney disease)   ? COPD (chronic obstructive pulmonary disease) ([place])   ? wears O2 at 2L via [place] at night  ? COVID-19 06/11/2020  ? Cystocele   ? Decubitus ulcers   ? Dehydration   ? Gout   ? Hematuria   ? Hepatitis C   ? Hyperlipidemia   ? Hypertension   ? Hypomagnesemia 06/25/2015  ? NSTEMI (non-ST elevated myocardial infarction) ([place]) 04/08/2021  ? OAB (overactive bladder)   ? Overactive bladder   ? Overdose opiate, accidental or unintentional, sequela 10/02/2020  ? Restless leg   ? Shoulder pain, bilateral   ? Urinary frequency   ? UTI (lower urinary tract infection)   ? Vaginal atrophy   ? ?Past Surgical History:  ?Procedure Laterality Date  ? ABDOMINAL HYSTERECTOMY    ? BACK SURGERY    ? BREAST LUMPECTOMY Right 2004  ? BREAST SURGERY    ? rt lumpectomy  ? CARPAL TUNNEL RELEASE    ? CATARACT EXTRACTION W/PHACO  10/19/2011  ? Procedure:  CATARACT EXTRACTION PHACO AND INTRAOCULAR LENS PLACEMENT (IOC);  Surgeon: Elta Guadeloupe T. Gershon Crane, MD;  [facility]: [facility];  Service: Ophthalmology;  Laterality: Right;  CDE:10.81  ? CATARACT EXTRACTION W/PHACO  11/02/2011  ? Procedure: CATARACT EXTRACTION PHACO AND INTRAOCULAR LENS PLACEMENT (IOC);  Surgeon: Elta Guadeloupe T. Gershon Crane, MD;  Location: [facility];  Service: Ophthalmology;  Laterality: Left;  CDE 8.60  ? CHOLECYSTECTOMY    ? 3/18  ? COLON SURGERY    ? INSERT / REPLACE / REMOVE PACEMAKER    ? JOINT REPLACEMENT    ? bilateral TKA  ? LOWER EXTREMITY ANGIOGRAPHY Left 10/14/2020  ? Procedure: LOWER EXTREMITY ANGIOGRAPHY;  Surgeon: Katha Cabal, MD;  Location: [place] [facility];  Service: Cardiovascular;  Laterality: Left;  ? LOWER EXTREMITY ANGIOGRAPHY Right 09/08/2021  ? Procedure: LOWER EXTREMITY ANGIOGRAPHY;  Surgeon: Katha Cabal, MD;   Location: [place] [facility];  Service: Cardiovascular;  Laterality: Right;  ? PACEMAKER INSERTION N/A 12/24/2015  ? Procedure: INSERTION PACEMAKER;  Surgeon: Isaias Cowman, MD;  Location: [facility];  Service: Cardiovascular;  Laterality: N/A;  ? ROTATOR CUFF REPAIR    ? bilateral  ? VIDEO BRONCHOSCOPY WITH ENDOBRONCHIAL NAVIGATION Right 11/11/2021  ? Procedure: VIDEO BRONCHOSCOPY WITH ENDOBRONCHIAL NAVIGATION;  Surgeon: Ottie Glazier, MD;  Location: [facility] ORS;  Service: Thoracic;  Laterality: Right;  ? ?Family History  ?Problem Relation Age of Onset  ? Kidney disease Brother   ? Heart disease Father   ? Heart disease Mother   ? Stroke Mother   ? Cancer Sister   ? Breast cancer Sister 49  ? Breast cancer Paternal Aunt   ? Anesthesia problems Neg Hx   ? Hypotension Neg Hx   ? Malignant hyperthermia Neg Hx   ? Pseudochol deficiency Neg Hx   ? ?Social History  ? ?Tobacco Use  ? Smoking status: Former  ?  Packs/day: 1.25  ?  Years: 40.00  ?  Pack years: 50.00  ?  Types: Cigarettes  ?  Quit date: 12/16/1998  ?  Years since quitting: 23.0  ? Smokeless tobacco: Never  ?Substance Use Topics  ? Alcohol use: No  ?  Alcohol/week: 0.0 standard drinks  ? ?Allergies  ?Allergen Reactions  ? Flexeril [Cyclobenzaprine Hcl]   ?  Severe Rash  ? Vancomycin Rash  ?  [place] Syndrome  ? ?Prior to Admission medications   ?Medication Sig Start Date End Date Taking? Authorizing Provider  ?acetaminophen (TYLENOL) 500 MG tablet Take 500 mg by mouth in the morning and at bedtime.   Yes [provider]  ?albuterol (PROVENTIL) (2.5 MG/3ML) 0.083% nebulizer solution USE 1 VIAL IN NEBULIZER EVERY 8 HOURS AS NEEDED FOR WHEEZING OR SHORTNESS OF BREATH. 12/22/21  Yes Karamalegos, Devonne Doughty, DO  ?apixaban (ELIQUIS) 2.5 MG TABS tablet Take 1 tablet (2.5 mg total) by mouth 2 (two) times daily. 04/09/21  Yes Wieting, [name], MD  ?atorvastatin (LIPITOR) 20 MG tablet TAKE 1 TABLET BY MOUTH ONCE DAILY FOR CHOLESTEROL 02/17/21  Yes Karamalegos,  Devonne Doughty, DO  ?bisacodyl (DULCOLAX) 5 MG EC tablet Take 2 tablets (10 mg total) by mouth daily as needed for moderate constipation. 07/04/20  Yes Karamalegos, Devonne Doughty, DO  ?BREZTRI AEROSPHERE 160-9-4.8 MCG/ACT AERO Inhale 2 puffs into the lungs 2 (two) times daily. 07/07/21  Yes Olin Hauser, DO  ?Cholecalciferol (VITAMIN D) 2000 units tablet Take 2,000 Units by mouth daily.    Yes [provider]  ?diclofenac Sodium (VOLTAREN) 1 % GEL Apply 4  g topically 2 (two) times daily as needed (As needed for pain). 10/15/21  Yes Kris Hartmann, NP  ?empagliflozin (JARDIANCE) 10 MG TABS tablet Take 1 tablet (10 mg total) by mouth daily before breakfast. 12/14/21  Yes Alisa Graff, FNP  ?feeding supplement (ENSURE ENLIVE / ENSURE PLUS) LIQD Take 237 mLs by mouth 2 (two) times daily between meals. 11/13/21  Yes Annita Brod, MD  ?furosemide (LASIX) 40 MG tablet Take 1 tablet (40 mg total) by mouth daily. 09/23/21  Yes Nicole Kindred A, DO  ?gabapentin (NEURONTIN) 300 MG capsule Take 1 capsule (300 mg total) by mouth 3 (three) times daily. 11/12/21  Yes Annita Brod, MD  ?[facility] ASPIRIN LOW DOSE 81 MG EC tablet TAKE 1 TABLET BY MOUTH ONCE DAILY. *SWALLOW WHOLE* 08/18/21  Yes Schnier, Dolores Lory, MD  ?levocetirizine (XYZAL) 5 MG tablet TAKE (1/2) TABLET BY MOUTH EVERY OTHER DAY. 06/19/21  Yes Olin Hauser, DO  ?Melatonin 10 MG TABS Take 10 mg by mouth at bedtime.   Yes [provider]  ?Multiple Vitamin (MULTIVITAMIN WITH MINERALS) TABS tablet Take 1 tablet by mouth daily. 11/13/21  Yes Annita Brod, MD  ?oxyCODONE (OXY IR/ROXICODONE) 5 MG immediate release tablet Take 2 tablets (10 mg total) by mouth 2 (two) times daily. May also take 1 tablet (5 mg total) at bedtime as needed for severe pain. Must last 30 days.. 12/23/21 01/22/22 Yes Milinda Pointer, MD  ?OXYGEN Inhale 2 L into the lungs daily.   Yes [provider]  ?rOPINIRole (REQUIP) 2 MG tablet TAKE 2  TABLETS BY MOUTH AT BEDTIME 10/21/21  Yes Karamalegos, Devonne Doughty, DO  ?oxyCODONE (OXY IR/ROXICODONE) 5 MG immediate release tablet Take 2 tablets (10 mg total) by mouth 2 (two) times daily. May also take 1 tabl

## 2021-12-28 ENCOUNTER — Ambulatory Visit: Payer: Medicare HMO | Attending: Family | Admitting: Family

## 2021-12-28 ENCOUNTER — Encounter: Payer: Self-pay | Admitting: Family

## 2021-12-28 VITALS — BP 119/75 | HR 63 | Resp 18 | Ht 67.0 in | Wt 159.0 lb

## 2021-12-28 DIAGNOSIS — Z87891 Personal history of nicotine dependence: Secondary | ICD-10-CM | POA: Diagnosis not present

## 2021-12-28 DIAGNOSIS — I130 Hypertensive heart and chronic kidney disease with heart failure and stage 1 through stage 4 chronic kidney disease, or unspecified chronic kidney disease: Secondary | ICD-10-CM | POA: Insufficient documentation

## 2021-12-28 DIAGNOSIS — Z79899 Other long term (current) drug therapy: Secondary | ICD-10-CM | POA: Diagnosis not present

## 2021-12-28 DIAGNOSIS — I5032 Chronic diastolic (congestive) heart failure: Secondary | ICD-10-CM | POA: Diagnosis not present

## 2021-12-28 DIAGNOSIS — I739 Peripheral vascular disease, unspecified: Secondary | ICD-10-CM | POA: Insufficient documentation

## 2021-12-28 DIAGNOSIS — M109 Gout, unspecified: Secondary | ICD-10-CM | POA: Diagnosis not present

## 2021-12-28 DIAGNOSIS — Z7984 Long term (current) use of oral hypoglycemic drugs: Secondary | ICD-10-CM | POA: Diagnosis not present

## 2021-12-28 DIAGNOSIS — J449 Chronic obstructive pulmonary disease, unspecified: Secondary | ICD-10-CM | POA: Diagnosis not present

## 2021-12-28 DIAGNOSIS — Z950 Presence of cardiac pacemaker: Secondary | ICD-10-CM | POA: Insufficient documentation

## 2021-12-28 DIAGNOSIS — I480 Paroxysmal atrial fibrillation: Secondary | ICD-10-CM | POA: Diagnosis not present

## 2021-12-28 DIAGNOSIS — N183 Chronic kidney disease, stage 3 unspecified: Secondary | ICD-10-CM

## 2021-12-28 DIAGNOSIS — C3411 Malignant neoplasm of upper lobe, right bronchus or lung: Secondary | ICD-10-CM | POA: Diagnosis not present

## 2021-12-28 DIAGNOSIS — Z90710 Acquired absence of both cervix and uterus: Secondary | ICD-10-CM | POA: Diagnosis not present

## 2021-12-28 DIAGNOSIS — I129 Hypertensive chronic kidney disease with stage 1 through stage 4 chronic kidney disease, or unspecified chronic kidney disease: Secondary | ICD-10-CM | POA: Diagnosis not present

## 2021-12-28 DIAGNOSIS — Z853 Personal history of malignant neoplasm of breast: Secondary | ICD-10-CM | POA: Insufficient documentation

## 2021-12-28 DIAGNOSIS — N189 Chronic kidney disease, unspecified: Secondary | ICD-10-CM | POA: Diagnosis not present

## 2021-12-28 DIAGNOSIS — E785 Hyperlipidemia, unspecified: Secondary | ICD-10-CM | POA: Diagnosis not present

## 2021-12-28 DIAGNOSIS — G8929 Other chronic pain: Secondary | ICD-10-CM | POA: Diagnosis not present

## 2021-12-28 DIAGNOSIS — Z9981 Dependence on supplemental oxygen: Secondary | ICD-10-CM | POA: Diagnosis not present

## 2021-12-28 DIAGNOSIS — I252 Old myocardial infarction: Secondary | ICD-10-CM | POA: Insufficient documentation

## 2021-12-28 DIAGNOSIS — Z9221 Personal history of antineoplastic chemotherapy: Secondary | ICD-10-CM | POA: Diagnosis not present

## 2021-12-28 DIAGNOSIS — C3490 Malignant neoplasm of unspecified part of unspecified bronchus or lung: Secondary | ICD-10-CM | POA: Insufficient documentation

## 2021-12-28 DIAGNOSIS — Z7982 Long term (current) use of aspirin: Secondary | ICD-10-CM | POA: Diagnosis not present

## 2021-12-28 DIAGNOSIS — Z923 Personal history of irradiation: Secondary | ICD-10-CM | POA: Diagnosis not present

## 2021-12-28 DIAGNOSIS — Z96653 Presence of artificial knee joint, bilateral: Secondary | ICD-10-CM | POA: Insufficient documentation

## 2021-12-28 DIAGNOSIS — Z7901 Long term (current) use of anticoagulants: Secondary | ICD-10-CM | POA: Diagnosis not present

## 2021-12-28 NOTE — Telephone Encounter (Signed)
Left a message for Kristin [name] letting him know that Dr. Raliegh Ip said it was OK to proceed. If he calls back, please verify and let him know of this.  ?

## 2021-12-28 NOTE — Patient Instructions (Addendum)
Continue weighing daily and call for an overnight weight gain of 3 pounds or more or a weekly weight gain of more than 5 pounds. ? ? ?If you have voicemail, please make sure your mailbox is cleaned out so that we may leave a message and please make sure to listen to any voicemails.  ? ? ?Put your compression socks on every morning with removal at bedtime ? ? ?Get mucinex and take that for your cough.  ?

## 2021-12-29 ENCOUNTER — Other Ambulatory Visit: Payer: Self-pay | Admitting: Family

## 2021-12-29 ENCOUNTER — Encounter: Payer: Self-pay | Admitting: Family

## 2021-12-29 MED ORDER — EMPAGLIFLOZIN 10 MG PO TABS: 10.0000 mg | ORAL_TABLET | Freq: Every day | ORAL | 5 refills | Status: DC

## 2021-12-29 NOTE — Progress Notes (Signed)
Kristin [name] RX sent to [facility] ?

## 2021-12-30 ENCOUNTER — Telehealth: Payer: Self-pay | Admitting: Family Medicine

## 2021-12-30 NOTE — Telephone Encounter (Signed)
Sharyn Lull OT with [facility] is calling in to request OT orders. She completed eval today.  ? ?Frequency:  ? ?1 week 1 ?2 week 1 ?1 week 1 ? ? ?CB:  [phone]- secure voicemail  ? ?

## 2021-12-30 NOTE — Telephone Encounter (Signed)
Kristin [name] is aware.  ?

## 2021-12-30 NOTE — Telephone Encounter (Signed)
Okay to proceed w verbal orders ? ?[name], DO ?[facility] ?[facility] ?12/30/2021, 1:11 PM ? ?

## 2022-01-01 ENCOUNTER — Inpatient Hospital Stay: Payer: Medicare HMO | Attending: Internal Medicine

## 2022-01-01 ENCOUNTER — Inpatient Hospital Stay (HOSPITAL_BASED_OUTPATIENT_CLINIC_OR_DEPARTMENT_OTHER): Payer: Medicare HMO | Admitting: Internal Medicine

## 2022-01-01 ENCOUNTER — Encounter: Payer: Self-pay | Admitting: Internal Medicine

## 2022-01-01 VITALS — BP 100/57 | HR 60 | Temp 97.9°F | Resp 18 | Wt 155.2 lb

## 2022-01-01 DIAGNOSIS — C3411 Malignant neoplasm of upper lobe, right bronchus or lung: Secondary | ICD-10-CM | POA: Diagnosis not present

## 2022-01-01 DIAGNOSIS — Z87891 Personal history of nicotine dependence: Secondary | ICD-10-CM | POA: Diagnosis not present

## 2022-01-01 DIAGNOSIS — I509 Heart failure, unspecified: Secondary | ICD-10-CM | POA: Insufficient documentation

## 2022-01-01 DIAGNOSIS — N183 Chronic kidney disease, stage 3 unspecified: Secondary | ICD-10-CM | POA: Diagnosis not present

## 2022-01-01 DIAGNOSIS — Z853 Personal history of malignant neoplasm of breast: Secondary | ICD-10-CM | POA: Diagnosis not present

## 2022-01-01 DIAGNOSIS — Z79899 Other long term (current) drug therapy: Secondary | ICD-10-CM | POA: Diagnosis not present

## 2022-01-01 DIAGNOSIS — I480 Paroxysmal atrial fibrillation: Secondary | ICD-10-CM | POA: Insufficient documentation

## 2022-01-01 DIAGNOSIS — I130 Hypertensive heart and chronic kidney disease with heart failure and stage 1 through stage 4 chronic kidney disease, or unspecified chronic kidney disease: Secondary | ICD-10-CM | POA: Insufficient documentation

## 2022-01-01 LAB — COMPREHENSIVE METABOLIC PANEL
ALT: 14 U/L (ref 0–44)
AST: 26 U/L (ref 15–41)
Albumin: 3.2 g/dL — ABNORMAL LOW (ref 3.5–5.0)
Alkaline Phosphatase: 76 U/L (ref 38–126)
Anion gap: 10 (ref 5–15)
BUN: 21 mg/dL (ref 8–23)
CO2: 23 mmol/L (ref 22–32)
Calcium: 9 mg/dL (ref 8.9–10.3)
Chloride: 102 mmol/L (ref 98–111)
Creatinine, Ser: 1.18 mg/dL — ABNORMAL HIGH (ref 0.44–1.00)
GFR, Estimated: 44 mL/min — ABNORMAL LOW (ref >60)
Glucose, Bld: 116 mg/dL — ABNORMAL HIGH (ref 70–99)
Potassium: 3.5 mmol/L (ref 3.5–5.1)
Sodium: 135 mmol/L (ref 135–145)
Total Bilirubin: 0.5 mg/dL (ref 0.3–1.2)
Total Protein: 7.4 g/dL (ref 6.5–8.1)

## 2022-01-01 LAB — CBC WITH DIFFERENTIAL/PLATELET
Abs Immature Granulocytes: 0 10*3/uL (ref 0.00–0.07)
Basophils Absolute: 0.1 10*3/uL (ref 0.0–0.1)
Basophils Relative: 2 %
Eosinophils Absolute: 0.2 10*3/uL (ref 0.0–0.5)
Eosinophils Relative: 5 %
HCT: 35.1 % — ABNORMAL LOW (ref 36.0–46.0)
Hemoglobin: 10.8 g/dL — ABNORMAL LOW (ref 12.0–15.0)
Immature Granulocytes: 0 %
Lymphocytes Relative: 16 %
Lymphs Abs: 0.5 10*3/uL — ABNORMAL LOW (ref 0.7–4.0)
MCH: 27.8 pg (ref 26.0–34.0)
MCHC: 30.8 g/dL (ref 30.0–36.0)
MCV: 90.2 fL (ref 80.0–100.0)
Monocytes Absolute: 0.3 10*3/uL (ref 0.1–1.0)
Monocytes Relative: 9 %
Neutro Abs: 2.4 10*3/uL (ref 1.7–7.7)
Neutrophils Relative %: 68 %
Platelets: 157 10*3/uL (ref 150–400)
RBC: 3.89 MIL/uL (ref 3.87–5.11)
RDW: 18.5 % — ABNORMAL HIGH (ref 11.5–15.5)
WBC: 3.4 10*3/uL — ABNORMAL LOW (ref 4.0–10.5)
nRBC: 0 % (ref 0.0–0.2)

## 2022-01-01 LAB — LACTATE DEHYDROGENASE: LDH: 173 U/L (ref 98–192)

## 2022-01-01 NOTE — Progress Notes (Signed)
Patient has cough with occasional white mucous.  Has chronic back/leg pain stable, 5/10 pain scale.  Breathing is stable and 02 concentration was increased to 5 LMP during recent hospital admission.   ?

## 2022-01-01 NOTE — Assessment & Plan Note (Addendum)
#  Small cell lung cancer-T2N1-stage II/limited stage.  CT brain with and without contrast negative.  PET scan no evidence of any distant site disease. S/p RT 5 Fx last 5/01.  ? ?#Elevated CT scan done in[ the hospital/pneumonia] in may 2023-showed improved right lower lobe mass ; however separate lesion noted along with increasing size of the hilar/mediastinal adenopathy.  Recommend follow-up imaging in 2 months/PET scan to reassess the status of the disease.  Discussed the rationale for waiting for 2 months. ? ?#Chronic respiratory failure COPD-Home O2 5 L/CHF clinically stable.  ? ?#Chronic CKD stage III stable. ? ?#Prognosis: Again had a long discussion with patient and his son/niece over the phone regarding overall poor prognosis small cell lung cancer; especially as patient is here for palliative treatment/no concurrent chemotherapy.  Await above imaging for further prognostication. ? ?# DISPOSITION: ?# follow up in  2 months- MD; labs- cbc/cmp; LDH; PET scan prior-Dr.B ? ?

## 2022-01-01 NOTE — Progress Notes (Signed)
[place] ?CONSULT NOTE ? ?Patient Care Team: ?Olin Hauser, DO as PCP - General (Family Medicine) ?Olin Hauser, DO as Consulting Physician (Family Medicine) ?Isaias Cowman, MD as Consulting Physician (Cardiology) ?Milinda Pointer, MD as Referring Physician (Pain Medicine) ?Alisa Graff, FNP as Nurse Practitioner (Family Medicine) ?Florance, Tomasa Blase, RN as [place] Management ?Telford Nab, RN as Sales executive ?Vanita Ingles, RN as Case Manager ?Rebekah Chesterfield, LCSW as Education officer, museum (Licensed [employer] representative) ? ?CHIEF COMPLAINTS/PURPOSE OF CONSULTATION: lung nodule/mass ? ?Oncology History Overview Note  ?# MARCH 2023-CT chest abdomen pelvis without contrast showed a 4.5 x 4.2 cm right upper lobe lung mass concerning for lung cancer.  Right upper lobe 0.5 cm nodule stable.  Left midlung 0.6 cm solid nodule.  2 indistinct peribronchovascular pulmonary nodules in the right lower lobe.  No other evidence of distant metastatic disease.  No evidence of hilar or mediastinal adenopathy noted on CT scan.  She underwent bronchoscopy with biopsy of the right upper lobe lung mass as well as station 10 R lymph node both of which were positive for metastatic small cell neuroendocrine carcinoma.[Dr.Aleskerov] ? ?# SMALL CELL CA LUNG s/p palliative ration SBRT x5 fractions finished May 1st 2023.  No concurrent chemotherapy. ? ?# Hx of breast cancer [remote; [facility]]-poor tolerance to chemotherapy/sepsis discontinued. ? ?Hx CHF, COPD on home oxygen, paroxysmal A-fib on Eliquis  ?  ?Malignant neoplasm of right upper lobe of lung ([place])  ?12/02/2021 Initial Diagnosis  ? Malignant neoplasm of right upper lobe of lung ([place]) ? ?  ?12/02/2021 Cancer Staging  ? Staging form: Lung, AJCC 8th Edition ?- Clinical: Stage IIB (cT2, cN1, cM0) - Signed by Cammie Sickle, MD on 12/02/2021 ?Histopathologic type: Small cell carcinoma, NOS ? ?   ? ? ? ?Oncology History Overview Note  ?# MARCH 2023-CT chest abdomen pelvis without contrast showed a 4.5 x 4.2 cm right upper lobe lung mass concerning for lung cancer.  Right upper lobe 0.5 cm nodule stable.  Left midlung 0.6 cm solid nodule.  2 indistinct peribronchovascular pulmonary nodules in the right lower lobe.  No other evidence of distant metastatic disease.  No evidence of hilar or mediastinal adenopathy noted on CT scan.  She underwent bronchoscopy with biopsy of the right upper lobe lung mass as well as station 10 R lymph node both of which were positive for metastatic small cell neuroendocrine carcinoma.[Dr.Aleskerov] ? ?# SMALL CELL CA LUNG s/p palliative ration SBRT x5 fractions finished May 1st 2023.  No concurrent chemotherapy. ? ?# Hx of breast cancer [remote; [facility]]-poor tolerance to chemotherapy/sepsis discontinued. ? ?Hx CHF, COPD on home oxygen, paroxysmal A-fib on Eliquis  ?  ?Malignant neoplasm of right upper lobe of lung ([place])  ?12/02/2021 Initial Diagnosis  ? Malignant neoplasm of right upper lobe of lung ([place]) ? ?  ?12/02/2021 Cancer Staging  ? Staging form: Lung, AJCC 8th Edition ?- Clinical: Stage IIB (cT2, cN1, cM0) - Signed by Cammie Sickle, MD on 12/02/2021 ?Histopathologic type: Small cell carcinoma, NOS ? ?  ? ? ? ?HISTORY OF PRESENTING ILLNESS: Patient is accompanied by her granddaughter.  Patient in wheelchair.  On [place] O2.  ? ?Kristin [name] 88 y.o.  female history of smoking with multiple medical problems including COPD/CHF/remote history of breast cancer-with a newly diagnosed small cell lung cancer -status post radiation is here for follow-up. ? ?In the interim patient was admitted to hospital for pneumonia.  Treated  with antibiotics.  Discharged home.  Patient is currently on 5 L of oxygen. ? ? ?Review of Systems  ?Constitutional:  Positive for malaise/fatigue and weight loss. Negative for chills, diaphoresis and fever.  ?HENT:  Negative for nosebleeds and sore throat.    ?Eyes:  Negative for double vision.  ?Respiratory:  Positive for cough, sputum production and shortness of breath. Negative for wheezing.   ?Cardiovascular:  Negative for chest pain, palpitations, orthopnea and leg swelling.  ?Gastrointestinal:  Negative for abdominal pain, blood in stool, constipation, diarrhea, heartburn, melena, nausea and vomiting.  ?Genitourinary:  Negative for dysuria, frequency and urgency.  ?Musculoskeletal:  Positive for back pain and joint pain.  ?Skin: Negative.  Negative for itching and rash.  ?Neurological:  Negative for dizziness, tingling, focal weakness, weakness and headaches.  ?Endo/Heme/Allergies:  Does not bruise/bleed easily.  ?Psychiatric/Behavioral:  Negative for depression. The patient is not nervous/anxious and does not have insomnia.    ? ?MEDICAL HISTORY:  ?Past Medical History:  ?Diagnosis Date  ? Acute postoperative pain 02/09/2018  ? Arrhythmia, sinus node 04/03/2014  ? Arthritis   ? Arthritis   ? Atrial fibrillation ([place])   ? Back pain   ? lower back chronic  ? Bradycardia   ? Breast cancer ([place]) 2004  ? right breast cancer  ? Cancer [facility]) 2004  ? rt breast cancer-post lumpectomy- chemo/rad  ? CHF (congestive heart failure) ([place])   ? Chronic back pain   ? CKD (chronic kidney disease)   ? COPD (chronic obstructive pulmonary disease) ([place])   ? wears O2 at 2L via [place] at night  ? COVID-19 06/11/2020  ? Cystocele   ? Decubitus ulcers   ? Dehydration   ? Gout   ? Hematuria   ? Hepatitis C   ? Hyperlipidemia   ? Hypertension   ? Hypomagnesemia 06/25/2015  ? NSTEMI (non-ST elevated myocardial infarction) ([place]) 04/08/2021  ? OAB (overactive bladder)   ? Overactive bladder   ? Overdose opiate, accidental or unintentional, sequela 10/02/2020  ? Restless leg   ? Shoulder pain, bilateral   ? Urinary frequency   ? UTI (lower urinary tract infection)   ? Vaginal atrophy   ? ? ?SURGICAL HISTORY: ?Past Surgical History:  ?Procedure Laterality Date  ? ABDOMINAL HYSTERECTOMY    ? BACK SURGERY     ? BREAST LUMPECTOMY Right 2004  ? BREAST SURGERY    ? rt lumpectomy  ? CARPAL TUNNEL RELEASE    ? CATARACT EXTRACTION W/PHACO  10/19/2011  ? Procedure: CATARACT EXTRACTION PHACO AND INTRAOCULAR LENS PLACEMENT (IOC);  Surgeon: Elta Guadeloupe T. Gershon Crane, MD;  [facility];  Service: Ophthalmology;  Laterality: Right;  CDE:10.81  ? CATARACT EXTRACTION W/PHACO  11/02/2011  ? Procedure: CATARACT EXTRACTION PHACO AND INTRAOCULAR LENS PLACEMENT (IOC);  Surgeon: Elta Guadeloupe T. Gershon Crane, MD;  Location: [facility];  Service: Ophthalmology;  Laterality: Left;  CDE 8.60  ? CHOLECYSTECTOMY    ? 3/18  ? COLON SURGERY    ? INSERT / REPLACE / REMOVE PACEMAKER    ? JOINT REPLACEMENT    ? bilateral TKA  ? LOWER EXTREMITY ANGIOGRAPHY Left 10/14/2020  ? Procedure: LOWER EXTREMITY ANGIOGRAPHY;  Surgeon: Katha Cabal, MD;  Location: [place] [facility];  Service: Cardiovascular;  Laterality: Left;  ? LOWER EXTREMITY ANGIOGRAPHY Right 09/08/2021  ? Procedure: LOWER EXTREMITY ANGIOGRAPHY;  Surgeon: Katha Cabal, MD;  Location: [place] [facility];  Service: Cardiovascular;  Laterality: Right;  ? PACEMAKER INSERTION N/A 12/24/2015  ?  Procedure: INSERTION PACEMAKER;  Surgeon: Isaias Cowman, MD;  Location: [facility];  Service: Cardiovascular;  Laterality: N/A;  ? ROTATOR CUFF REPAIR    ? bilateral  ? VIDEO BRONCHOSCOPY WITH ENDOBRONCHIAL NAVIGATION Right 11/11/2021  ? Procedure: VIDEO BRONCHOSCOPY WITH ENDOBRONCHIAL NAVIGATION;  Surgeon: Ottie Glazier, MD;  Location: [facility];  Service: Thoracic;  Laterality: Right;  ? ? ?SOCIAL HISTORY: ?Social History  ? ?Socioeconomic History  ? Marital status: Single  ?  Spouse name: Not on file  ? Number of children: 2  ? Years of education: 2  ? Highest education level: 12th grade  ?Occupational History  ? Occupation: retired  ?Tobacco Use  ? Smoking status: Former  ?  Packs/day: 1.25  ?  Years: 40.00  ?  Pack years: 50.00  ?  Types: Cigarettes  ?  Quit date: 12/16/1998  ?  Years since quitting: 23.0  ?  Smokeless tobacco: Never  ?Vaping Use  ? Vaping Use: Never used  ?Substance and Sexual Activity  ? Alcohol use: No  ?  Alcohol/week: 0.0 standard drinks  ? Drug use: Yes  ?  Types: Oxycodone  ? Sexual

## 2022-01-05 DIAGNOSIS — I495 Sick sinus syndrome: Secondary | ICD-10-CM | POA: Diagnosis not present

## 2022-01-05 DIAGNOSIS — J449 Chronic obstructive pulmonary disease, unspecified: Secondary | ICD-10-CM | POA: Diagnosis not present

## 2022-01-05 DIAGNOSIS — I10 Essential (primary) hypertension: Secondary | ICD-10-CM | POA: Diagnosis not present

## 2022-01-05 DIAGNOSIS — I70239 Atherosclerosis of native arteries of right leg with ulceration of unspecified site: Secondary | ICD-10-CM | POA: Diagnosis not present

## 2022-01-05 DIAGNOSIS — I4891 Unspecified atrial fibrillation: Secondary | ICD-10-CM | POA: Diagnosis not present

## 2022-01-05 DIAGNOSIS — I5033 Acute on chronic diastolic (congestive) heart failure: Secondary | ICD-10-CM | POA: Diagnosis not present

## 2022-01-05 DIAGNOSIS — Z950 Presence of cardiac pacemaker: Secondary | ICD-10-CM | POA: Diagnosis not present

## 2022-01-05 DIAGNOSIS — I480 Paroxysmal atrial fibrillation: Secondary | ICD-10-CM | POA: Diagnosis not present

## 2022-01-05 DIAGNOSIS — Z9889 Other specified postprocedural states: Secondary | ICD-10-CM | POA: Diagnosis not present

## 2022-01-06 ENCOUNTER — Other Ambulatory Visit: Payer: Self-pay

## 2022-01-06 NOTE — Patient Outreach (Signed)
[facility]) Care Management ? ?01/06/2022 ? ?[name] ?[birth year] ?[phone] ? ? ?Telephone Assessment ? ? ?Unsuccessful quarterly outreach attempt to patient. No answer at present.  ? ? ? ? ? ? ?Plan: ?RN CM will make outreach attempt within the month of July. ? ? ? ?Enzo Montgomery, RN,BSN,CCM ?[facility] Care Management ?Telephonic Care Management Coordinator ?Direct Phone: [phone] ?Toll Free: [phone] ?Fax: [phone] ? ?

## 2022-01-10 DIAGNOSIS — J45998 Other asthma: Secondary | ICD-10-CM | POA: Diagnosis not present

## 2022-01-14 ENCOUNTER — Other Ambulatory Visit (INDEPENDENT_AMBULATORY_CARE_PROVIDER_SITE_OTHER): Payer: Self-pay | Admitting: Nurse Practitioner

## 2022-01-14 ENCOUNTER — Other Ambulatory Visit (INDEPENDENT_AMBULATORY_CARE_PROVIDER_SITE_OTHER): Payer: Medicare HMO | Admitting: Family Medicine

## 2022-01-14 DIAGNOSIS — I5032 Chronic diastolic (congestive) heart failure: Secondary | ICD-10-CM | POA: Diagnosis not present

## 2022-01-14 DIAGNOSIS — R296 Repeated falls: Secondary | ICD-10-CM

## 2022-01-14 DIAGNOSIS — J189 Pneumonia, unspecified organism: Secondary | ICD-10-CM

## 2022-01-14 DIAGNOSIS — I739 Peripheral vascular disease, unspecified: Secondary | ICD-10-CM

## 2022-01-14 DIAGNOSIS — A419 Sepsis, unspecified organism: Secondary | ICD-10-CM

## 2022-01-14 DIAGNOSIS — J432 Centrilobular emphysema: Secondary | ICD-10-CM

## 2022-01-14 NOTE — Progress Notes (Signed)
RECERT  Received home health orders orders from [facility] Start of care 11/13/21.   Certification and orders from 11/13/21 through 01/11/22 are reviewed, signed and faxed back to home health company.  Need of intermittent skilled services at home: Yes  The home health care plan has been established by me and will be reviewed and updated as needed to maximize patient recovery.  I certify that all home health services have been and will be furnished to the patient while under my care.  Face-to-face encounter in which the need for home health services was established: 12/03/21  Patient is receiving home health services for the following diagnoses: Problem List Items Addressed This Visit     Sepsis ([place])   Chronic diastolic CHF (congestive heart failure) ([place]) - Primary   Centrilobular emphysema ([place])   Other Visit Diagnoses     Recurrent falls       Community acquired pneumonia, unspecified laterality            [name], DO

## 2022-01-15 ENCOUNTER — Ambulatory Visit (INDEPENDENT_AMBULATORY_CARE_PROVIDER_SITE_OTHER): Payer: Medicare HMO

## 2022-01-15 ENCOUNTER — Ambulatory Visit (INDEPENDENT_AMBULATORY_CARE_PROVIDER_SITE_OTHER): Payer: Medicare HMO | Admitting: Vascular Surgery

## 2022-01-15 ENCOUNTER — Encounter (INDEPENDENT_AMBULATORY_CARE_PROVIDER_SITE_OTHER): Payer: Self-pay | Admitting: Vascular Surgery

## 2022-01-15 VITALS — BP 104/65 | HR 66 | Resp 16

## 2022-01-15 DIAGNOSIS — N183 Chronic kidney disease, stage 3 unspecified: Secondary | ICD-10-CM | POA: Diagnosis not present

## 2022-01-15 DIAGNOSIS — C3411 Malignant neoplasm of upper lobe, right bronchus or lung: Secondary | ICD-10-CM

## 2022-01-15 DIAGNOSIS — I739 Peripheral vascular disease, unspecified: Secondary | ICD-10-CM | POA: Diagnosis not present

## 2022-01-15 DIAGNOSIS — E785 Hyperlipidemia, unspecified: Secondary | ICD-10-CM | POA: Diagnosis not present

## 2022-01-15 DIAGNOSIS — I7025 Atherosclerosis of native arteries of other extremities with ulceration: Secondary | ICD-10-CM

## 2022-01-15 DIAGNOSIS — I10 Essential (primary) hypertension: Secondary | ICD-10-CM

## 2022-01-15 NOTE — Assessment & Plan Note (Signed)
Contributes lower extremity swelling.  Avoid contrast unless absolutely necessary.

## 2022-01-15 NOTE — Assessment & Plan Note (Signed)
Follows with oncology

## 2022-01-15 NOTE — Progress Notes (Signed)
MRN : [id number]  Kristin [name] is a 88 y.o. (21-Jan-1930) female who presents with chief complaint of  Chief Complaint  Patient presents with   Follow-up    Ultrasound follow up  .  History of Present Illness: Patient returns today in follow up of her PAD.  She has undergone bilateral lower extremity revascularization last year and earlier this year by Dr. Delana Meyer.  Her left ankle ulcer has basically healed at this point with only a small scab left.  She has chronic lower extremity swelling.  She has a Scientist, product/process development of medical issues.  No fevers or chills.  She has chronic arthritic pain in her lower extremities. Duplex showed no obvious focal stenosis in the lower extremities with diffuse atherosclerotic changes.  ABIs were 1.10 on the right and 1.25 on the left with digital indices of 1.77 bilaterally and strong waveforms.  Current Outpatient Medications  Medication Sig Dispense Refill   acetaminophen (TYLENOL) 500 MG tablet Take 500 mg by mouth in the morning and at bedtime.     albuterol (PROVENTIL) (2.5 MG/3ML) 0.083% nebulizer solution USE 1 VIAL IN NEBULIZER EVERY 8 HOURS AS NEEDED FOR WHEEZING OR SHORTNESS OF BREATH. 90 mL 0   apixaban (ELIQUIS) 2.5 MG TABS tablet Take 1 tablet (2.5 mg total) by mouth 2 (two) times daily. 60 tablet 0   atorvastatin (LIPITOR) 20 MG tablet TAKE 1 TABLET BY MOUTH ONCE DAILY FOR CHOLESTEROL 30 tablet 11   bisacodyl (DULCOLAX) 5 MG EC tablet Take 2 tablets (10 mg total) by mouth daily as needed for moderate constipation. 180 tablet 1   BREZTRI AEROSPHERE 160-9-4.8 MCG/ACT AERO Inhale 2 puffs into the lungs 2 (two) times daily. 10.7 g 5   Cholecalciferol (VITAMIN D) 2000 units tablet Take 2,000 Units by mouth daily.      diclofenac Sodium (VOLTAREN) 1 % GEL Apply 4 g topically 2 (two) times daily as needed (As needed for pain). 100 g 1   empagliflozin (JARDIANCE) 10 MG TABS tablet Take 1 tablet (10 mg total) by mouth daily before breakfast. 30 tablet 5   feeding  supplement (ENSURE ENLIVE / ENSURE PLUS) LIQD Take 237 mLs by mouth 2 (two) times daily between meals. 237 mL 12   furosemide (LASIX) 40 MG tablet Take 1 tablet (40 mg total) by mouth daily. 30 tablet 1   gabapentin (NEURONTIN) 300 MG capsule Take 1 capsule (300 mg total) by mouth 3 (three) times daily. 90 capsule 1   GNP ASPIRIN LOW DOSE 81 MG EC tablet TAKE 1 TABLET BY MOUTH ONCE DAILY. *SWALLOW WHOLE* 150 tablet 11   levocetirizine (XYZAL) 5 MG tablet TAKE (1/2) TABLET BY MOUTH EVERY OTHER DAY. 8 tablet 9   Melatonin 10 MG TABS Take 10 mg by mouth at bedtime.     Multiple Vitamin (MULTIVITAMIN WITH MINERALS) TABS tablet Take 1 tablet by mouth daily. 30 tablet 1   oxyCODONE (OXY IR/ROXICODONE) 5 MG immediate release tablet Take 2 tablets (10 mg total) by mouth 2 (two) times daily. May also take 1 tablet (5 mg total) at bedtime as needed for severe pain. Must last 30 days.. 150 tablet 0   [START ON 01/22/2022] oxyCODONE (OXY IR/ROXICODONE) 5 MG immediate release tablet Take 2 tablets (10 mg total) by mouth 2 (two) times daily. May also take 1 tablet (5 mg total) at bedtime as needed for severe pain. Must last 30 days.. 150 tablet 0   [START ON 02/21/2022] oxyCODONE (OXY IR/ROXICODONE) 5 MG  immediate release tablet Take 2 tablets (10 mg total) by mouth 2 (two) times daily. May also take 1 tablet (5 mg total) at bedtime as needed for severe pain. Must last 30 days.. 150 tablet 0   OXYGEN Inhale 2 L into the lungs daily.     rOPINIRole (REQUIP) 2 MG tablet TAKE 2 TABLETS BY MOUTH AT BEDTIME 180 tablet 1   No current facility-administered medications for this visit.    Past Medical History:  Diagnosis Date   Acute postoperative pain 02/09/2018   Arrhythmia, sinus node 04/03/2014   Arthritis    Arthritis    Atrial fibrillation (HCC)    Back pain    lower back chronic   Bradycardia    Breast cancer ([place]) 2004   right breast cancer   Cancer ([place]) 2004   rt breast cancer-post lumpectomy- chemo/rad    CHF (congestive heart failure) (HCC)    Chronic back pain    CKD (chronic kidney disease)    COPD (chronic obstructive pulmonary disease) ([place])    wears O2 at 2L via  at night   COVID-19 06/11/2020   Cystocele    Decubitus ulcers    Dehydration    Gout    Hematuria    Hepatitis C    Hyperlipidemia    Hypertension    Hypomagnesemia 06/25/2015   NSTEMI (non-ST elevated myocardial infarction) ([place]) 04/08/2021   OAB (overactive bladder)    Overactive bladder    Overdose opiate, accidental or unintentional, sequela 10/02/2020   Restless leg    Shoulder pain, bilateral    Urinary frequency    UTI (lower urinary tract infection)    Vaginal atrophy     Past Surgical History:  Procedure Laterality Date   ABDOMINAL HYSTERECTOMY     BACK SURGERY     BREAST LUMPECTOMY Right 2004   BREAST SURGERY     rt lumpectomy   CARPAL TUNNEL RELEASE     CATARACT EXTRACTION W/PHACO  10/19/2011   Procedure: CATARACT EXTRACTION PHACO AND INTRAOCULAR LENS PLACEMENT ([place]);  Surgeon: Elta Guadeloupe T. Gershon Crane, MD;  Location: [facility];  Service: Ophthalmology;  Laterality: Right;  CDE:10.81   CATARACT EXTRACTION W/PHACO  11/02/2011   Procedure: CATARACT EXTRACTION PHACO AND INTRAOCULAR LENS PLACEMENT ([facility]);  Surgeon: Elta Guadeloupe T. Gershon Crane, MD;  Location: [facility];  Service: Ophthalmology;  Laterality: Left;  CDE 8.60   CHOLECYSTECTOMY     3/18   COLON SURGERY     INSERT / REPLACE / REMOVE PACEMAKER     JOINT REPLACEMENT     bilateral TKA   LOWER EXTREMITY ANGIOGRAPHY Left 10/14/2020   Procedure: LOWER EXTREMITY ANGIOGRAPHY;  Surgeon: Katha Cabal, MD;  Location: [facility];  Service: Cardiovascular;  Laterality: Left;   LOWER EXTREMITY ANGIOGRAPHY Right 09/08/2021   Procedure: LOWER EXTREMITY ANGIOGRAPHY;  Surgeon: Katha Cabal, MD;  Location: [facility];  Service: Cardiovascular;  Laterality: Right;   PACEMAKER INSERTION N/A 12/24/2015   Procedure: INSERTION PACEMAKER;  Surgeon: Isaias Cowman, MD;  Location: [facility];  Service: Cardiovascular;  Laterality: N/A;   ROTATOR CUFF REPAIR     bilateral   VIDEO BRONCHOSCOPY WITH ENDOBRONCHIAL NAVIGATION Right 11/11/2021   Procedure: VIDEO BRONCHOSCOPY WITH ENDOBRONCHIAL NAVIGATION;  Surgeon: Ottie Glazier, MD;  Location: [facility];  Service: Thoracic;  Laterality: Right;     Social History   Tobacco Use   Smoking status: Former    Packs/day: 1.25    Years: 40.00    Pack  years: 50.00    Types: Cigarettes    Quit date: 12/16/1998    Years since quitting: 23.0   Smokeless tobacco: Never  Vaping Use   Vaping Use: Never used  Substance Use Topics   Alcohol use: No    Alcohol/week: 0.0 standard drinks   Drug use: Yes    Types: Oxycodone      Family History  Problem Relation Age of Onset   Kidney disease Brother    Heart disease Father    Heart disease Mother    Stroke Mother    Cancer Sister    Breast cancer Sister 81   Breast cancer Paternal Aunt    Anesthesia problems Neg Hx    Hypotension Neg Hx    Malignant hyperthermia Neg Hx    Pseudochol deficiency Neg Hx      Allergies  Allergen Reactions   Flexeril [Cyclobenzaprine Hcl]     Severe Rash   Vancomycin Rash    [place] Syndrome     REVIEW OF SYSTEMS (Negative unless checked)  Constitutional: [] Weight loss  [] Fever  [] Chills Cardiac: [] Chest pain   [] Chest pressure   [x] Palpitations   [] Shortness of breath when laying flat   [] Shortness of breath at rest   [x] Shortness of breath with exertion. Vascular:  [] Pain in legs with walking   [] Pain in legs at rest   [] Pain in legs when laying flat   [] Claudication   [] Pain in feet when walking  [] Pain in feet at rest  [] Pain in feet when laying flat   [] History of DVT   [] Phlebitis   [x] Swelling in legs   [] Varicose veins   [x] Non-healing ulcers Pulmonary:   [] Uses home oxygen   [] Productive cough   [] Hemoptysis   [] Wheeze  [x] COPD   [] Asthma Neurologic:  [] Dizziness  [] Blackouts   [] Seizures    [] History of stroke   [] History of TIA  [] Aphasia   [] Temporary blindness   [] Dysphagia   [] Weakness or numbness in arms   [] Weakness or numbness in legs Musculoskeletal:  [x] Arthritis   [] Joint swelling   [x] Joint pain   [] Low back pain Hematologic:  [] Easy bruising  [] Easy bleeding   [] Hypercoagulable state   [] Anemic   Gastrointestinal:  [] Blood in stool   [] Vomiting blood  [] Gastroesophageal reflux/heartburn   [] Abdominal pain Genitourinary:  [] Chronic kidney disease   [] Difficult urination  [] Frequent urination  [] Burning with urination   [] Hematuria Skin:  [] Rashes   [x] Ulcers   [x] Wounds Psychological:  [] History of anxiety   []  History of major depression.  Physical Examination  BP 104/65 (BP Location: Right Arm)   Pulse 66   Resp 16  Gen:  WD/WN, NAD Head: [place]/AT, No temporalis wasting. Ear/Nose/Throat: Hearing grossly intact, nares w/o erythema or drainage Eyes: Conjunctiva clear. Sclera non-icteric Neck: Supple.  Trachea midline Pulmonary:  Good air movement, no use of accessory muscles.  Cardiac: Irregular Vascular:  Vessel Right Left  Radial Palpable Palpable                          PT Trace palpable Trace palpable  DP 1+ palpable 1+ palpable   Musculoskeletal: M/S 5/5 throughout.  No deformity or atrophy.  In a wheelchair.  1-2+ bilateral lower extremity edema. Neurologic: Sensation grossly intact in extremities.  Symmetrical.  Speech is fluent.  Psychiatric: Judgment intact, Mood & affect appropriate for pt's clinical situation. Dermatologic: No rashes or ulcers noted.  No cellulitis or open wounds.  Labs Recent Results (from the past 2160 hour(s))  Resp Panel by RT-PCR (Flu A&B, Covid) Nasopharyngeal Swab     Status: None   Collection Time: 11/05/21 12:14 PM   Specimen: Nasopharyngeal Swab; Nasopharyngeal(NP) swabs in vial transport medium  Result Value Ref Range   SARS Coronavirus 2 by RT PCR NEGATIVE NEGATIVE    Comment: (NOTE) SARS-CoV-2 target  nucleic acids are NOT DETECTED.  The SARS-CoV-2 RNA is generally detectable in upper respiratory specimens during the acute phase of infection. The lowest concentration of SARS-CoV-2 viral copies this assay can detect is 138 copies/mL. A negative result does not preclude SARS-Cov-2 infection and should not be used as the sole basis for treatment or other patient management decisions. A negative result may occur with  improper specimen collection/handling, submission of specimen other than nasopharyngeal swab, presence of viral mutation(s) within the areas targeted by this assay, and inadequate number of viral copies(<138 copies/mL). A negative result must be combined with clinical observations, patient history, and epidemiological information. The expected result is Negative.  Fact Sheet for Patients:  EntrepreneurPulse.com.au  Fact Sheet for Healthcare Providers:  IncredibleEmployment.be  This test is no t yet approved or cleared by the [place] FDA and  has been authorized for detection and/or diagnosis of SARS-CoV-2 by FDA under an Emergency Use Authorization (EUA). This EUA will remain  in effect (meaning this test can be used) for the duration of the COVID-19 declaration under Section 564(b)(1) of the Act, 21 U.S.C.section 360bbb-3(b)(1), unless the authorization is terminated  or revoked sooner.       Influenza A by PCR NEGATIVE NEGATIVE   Influenza B by PCR NEGATIVE NEGATIVE    Comment: (NOTE) The Xpert Xpress SARS-CoV-2/FLU/RSV plus assay is intended as an aid in the diagnosis of influenza from Nasopharyngeal swab specimens and should not be used as a sole basis for treatment. Nasal washings and aspirates are unacceptable for Xpert Xpress SARS-CoV-2/FLU/RSV testing.  Fact Sheet for Patients: EntrepreneurPulse.com.au  Fact Sheet for Healthcare Providers: IncredibleEmployment.be  This test  is not yet approved or cleared by the [place] FDA and has been authorized for detection and/or diagnosis of SARS-CoV-2 by FDA under an Emergency Use Authorization (EUA). This EUA will remain in effect (meaning this test can be used) for the duration of the COVID-19 declaration under Section 564(b)(1) of the Act, 21 U.S.C. section 360bbb-3(b)(1), unless the authorization is terminated or revoked.  Performed at [facility], [address] [place], [place] [address]   Basic metabolic panel     Status: Abnormal   Collection Time: 11/05/21 12:14 PM  Result Value Ref Range   Sodium 136 135 - 145 mmol/L   Potassium 4.6 3.5 - 5.1 mmol/L   Chloride 102 98 - 111 mmol/L   CO2 25 22 - 32 mmol/L   Glucose, Bld 94 70 - 99 mg/dL    Comment: Glucose reference range applies only to samples taken after fasting for at least 8 hours.   BUN 33 (H) 8 - 23 mg/dL   Creatinine, Ser 1.67 (H) 0.44 - 1.00 mg/dL   Calcium 9.1 8.9 - 10.3 mg/dL   GFR, Estimated 29 (L) >60 mL/min    Comment: (NOTE) Calculated using the CKD-EPI Creatinine Equation (2021)    Anion gap 9 5 - 15    Comment: Performed at [facility], [address] [place],  [address]  CBC with Differential/Platelet     Status: Abnormal   Collection Time: 11/05/21 12:14 PM  Result Value Ref Range   WBC 7.1 4.0 - 10.5 K/uL   RBC 4.29 3.87 - 5.11 MIL/uL   Hemoglobin 11.7 (L) 12.0 - 15.0 g/dL   HCT 38.4 36.0 - 46.0 %   MCV 89.5 80.0 - 100.0 fL   MCH 27.3 26.0 - 34.0 pg   MCHC 30.5 30.0 - 36.0 g/dL   RDW 15.6 (H) 11.5 - 15.5 %   Platelets 144 (L) 150 - 400 K/uL   nRBC 0.0 0.0 - 0.2 %   Neutrophils Relative % 80 %   Neutro Abs 5.7 1.7 - 7.7 K/uL   Lymphocytes Relative 11 %   Lymphs Abs 0.8 0.7 - 4.0 K/uL   Monocytes Relative 6 %   Monocytes Absolute 0.4 0.1 - 1.0 K/uL   Eosinophils Relative 2 %   Eosinophils Absolute 0.1 0.0 - 0.5 K/uL   Basophils Relative 1 %   Basophils Absolute 0.0 0.0 - 0.1 K/uL    Immature Granulocytes 0 %   Abs Immature Granulocytes 0.03 0.00 - 0.07 K/uL    Comment: Performed at [facility], [address] [place], [place] [address]  Brain natriuretic peptide     Status: Abnormal   Collection Time: 11/05/21 12:14 PM  Result Value Ref Range   B Natriuretic Peptide 1,727.9 (H) 0.0 - 100.0 pg/mL    Comment: Performed at [facility], [address] [place], [place] [address]  Lactic acid, plasma     Status: None   Collection Time: 11/05/21 12:14 PM  Result Value Ref Range   Lactic Acid, Venous 1.5 0.5 - 1.9 mmol/L    Comment: Performed at [facility], [address] [place], [place] [address]  Protime-INR     Status: Abnormal   Collection Time: 11/05/21 12:14 PM  Result Value Ref Range   Prothrombin Time 16.5 (H) 11.4 - 15.2 seconds   INR 1.3 (H) 0.8 - 1.2    Comment: (NOTE) INR goal varies based on device and disease states. Performed at [facility], [address] [place], [place] [address]   APTT     Status: Abnormal   Collection Time: 11/05/21 12:14 PM  Result Value Ref Range   aPTT 37 (H) 24 - 36 seconds    Comment:        IF BASELINE aPTT IS ELEVATED, SUGGEST PATIENT RISK ASSESSMENT BE USED TO DETERMINE APPROPRIATE ANTICOAGULANT THERAPY. Performed at [facility], [address] [place], [place] [address]   Lipid panel     Status: None   Collection Time: 11/05/21 12:14 PM  Result Value Ref Range   Cholesterol 109 0 - 200 mg/dL   Triglycerides 46 <150 mg/dL   HDL 51 >40 mg/dL   Total CHOL/HDL Ratio 2.1 RATIO   VLDL 9 0 - 40 mg/dL   LDL Cholesterol 49 0 - 99 mg/dL    Comment:        Total Cholesterol/HDL:CHD Risk Coronary Heart Disease Risk Table                     Men   Women  1/2 Average Risk   3.4   3.3  Average Risk       5.0   4.4  2 X Average Risk   9.6   7.1  3 X Average Risk  23.4   11.0        Use the calculated Patient Ratio above and the CHD Risk Table to determine the  patient's CHD Risk.        ATP III CLASSIFICATION (LDL):  <100     mg/dL   Optimal  100-129  mg/dL   Near or Above                    Optimal  130-159  mg/dL   Borderline  160-189  mg/dL   High  >190     mg/dL   Very High Performed at [facility], [address] [place], [place] [address]   Hepatic function panel     Status: Abnormal   Collection Time: 11/05/21 12:14 PM  Result Value Ref Range   Total Protein 7.0 6.5 - 8.1 g/dL   Albumin 3.8 3.5 - 5.0 g/dL   AST 74 (H) 15 - 41 U/L   ALT 63 (H) 0 - 44 U/L   Alkaline Phosphatase 106 38 - 126 U/L   Total Bilirubin 1.1 0.3 - 1.2 mg/dL   Bilirubin, Direct 0.3 (H) 0.0 - 0.2 mg/dL   Indirect Bilirubin 0.8 0.3 - 0.9 mg/dL    Comment: Performed at [facility], [address] [place], [place] [address]  Lipase, blood     Status: None   Collection Time: 11/05/21 12:14 PM  Result Value Ref Range   Lipase 30 11 - 51 U/L    Comment: Performed at [facility], [address] [place], [place] [address]  Culture, blood (routine x 2)     Status: Abnormal   Collection Time: 11/05/21 12:15 PM   Specimen: BLOOD  Result Value Ref Range   Specimen Description      BLOOD RIGHT FA Performed at [facility], [address], [place], [place] [address]    Special Requests      BOTTLES DRAWN AEROBIC AND ANAEROBIC Blood Culture adequate volume Performed at [facility], [address] [place], [place] [address]    Culture  Setup Time      AEROBIC BOTTLE ONLY GRAM NEGATIVE RODS CRITICAL VALUE NOTED.  VALUE IS CONSISTENT WITH PREVIOUSLY REPORTED AND CALLED VALUE. MJU Performed at [facility], [place], [place] [address]    Culture (A)     ESCHERICHIA COLI SUSCEPTIBILITIES PERFORMED ON PREVIOUS CULTURE WITHIN THE LAST 5 DAYS. Performed at [facility], [place] [address] [place], [place] [address]    Report Status 11/08/2021 FINAL   Culture, blood (routine x 2)      Status: Abnormal   Collection Time: 11/05/21 12:17 PM   Specimen: BLOOD  Result Value Ref Range   Specimen Description      BLOOD LEFT FA Performed at [facility], [address], [place], [place] [address]    Special Requests      BOTTLES DRAWN AEROBIC AND ANAEROBIC Blood Culture adequate volume Performed at [facility], [place], [place] [address]    Culture  Setup Time      GRAM NEGATIVE RODS IN BOTH AEROBIC AND ANAEROBIC BOTTLES CRITICAL RESULT CALLED TO, READ BACK BY AND VERIFIED WITH: Kimiko Common ROBINS @0153  ON 11/06/21 SKL Performed at [facility], [address] [address], [place], [place] [address]    Culture (A)     ESCHERICHIA COLI Confirmed Extended Spectrum Beta-Lactamase Producer (ESBL).  In bloodstream infections from ESBL organisms, carbapenems are preferred over piperacillin/tazobactam. They are shown to have a lower risk of mortality.    Report Status 11/08/2021 FINAL    Organism ID,  Bacteria ESCHERICHIA COLI       Susceptibility   Escherichia coli - MIC*    AMPICILLIN >=32 RESISTANT Resistant     CEFAZOLIN >=64 RESISTANT Resistant     CEFEPIME 16 RESISTANT Resistant     CEFTAZIDIME RESISTANT Resistant     CEFTRIAXONE >=64 RESISTANT Resistant     CIPROFLOXACIN >=4 RESISTANT Resistant     GENTAMICIN <=1 SENSITIVE Sensitive     IMIPENEM <=0.25 SENSITIVE Sensitive     TRIMETH/SULFA >=320 RESISTANT Resistant     AMPICILLIN/SULBACTAM 16 INTERMEDIATE Intermediate     PIP/TAZO <=4 SENSITIVE Sensitive     * ESCHERICHIA COLI  Blood Culture ID Panel (Reflexed)     Status: Abnormal   Collection Time: 11/05/21 12:17 PM  Result Value Ref Range   Enterococcus faecalis NOT DETECTED NOT DETECTED   Enterococcus Faecium NOT DETECTED NOT DETECTED   Listeria monocytogenes NOT DETECTED NOT DETECTED   Staphylococcus species NOT DETECTED NOT DETECTED   Staphylococcus aureus (BCID) NOT DETECTED NOT DETECTED   Staphylococcus epidermidis NOT DETECTED  NOT DETECTED   Staphylococcus lugdunensis NOT DETECTED NOT DETECTED   Streptococcus species NOT DETECTED NOT DETECTED   Streptococcus agalactiae NOT DETECTED NOT DETECTED   Streptococcus pneumoniae NOT DETECTED NOT DETECTED   Streptococcus pyogenes NOT DETECTED NOT DETECTED   A.calcoaceticus-baumannii NOT DETECTED NOT DETECTED   Bacteroides fragilis NOT DETECTED NOT DETECTED   Enterobacterales DETECTED (A) NOT DETECTED    Comment: Enterobacterales represent a large order of gram negative bacteria, not a single organism. CRITICAL RESULT CALLED TO, READ BACK BY AND VERIFIED WITH: Zury Fazzino ROBINS @0153  ON 11/06/21 SKL    Enterobacter cloacae complex NOT DETECTED NOT DETECTED   Escherichia coli DETECTED (A) NOT DETECTED    Comment: CRITICAL RESULT CALLED TO, READ BACK BY AND VERIFIED WITH: Kashlyn Salinas ROBINS @0153  ON 11/06/21 SKL    Klebsiella aerogenes NOT DETECTED NOT DETECTED   Klebsiella oxytoca NOT DETECTED NOT DETECTED   Klebsiella pneumoniae NOT DETECTED NOT DETECTED   Proteus species NOT DETECTED NOT DETECTED   Salmonella species NOT DETECTED NOT DETECTED   Serratia marcescens NOT DETECTED NOT DETECTED   Haemophilus influenzae NOT DETECTED NOT DETECTED   Neisseria meningitidis NOT DETECTED NOT DETECTED   Pseudomonas aeruginosa NOT DETECTED NOT DETECTED   Stenotrophomonas maltophilia NOT DETECTED NOT DETECTED   Candida albicans NOT DETECTED NOT DETECTED   Candida auris NOT DETECTED NOT DETECTED   Candida glabrata NOT DETECTED NOT DETECTED   Candida krusei NOT DETECTED NOT DETECTED   Candida parapsilosis NOT DETECTED NOT DETECTED   Candida tropicalis NOT DETECTED NOT DETECTED   Cryptococcus neoformans/gattii NOT DETECTED NOT DETECTED   CTX-M ESBL DETECTED (A) NOT DETECTED    Comment: CRITICAL RESULT CALLED TO, READ BACK BY AND VERIFIED WITH: Roald Lukacs ROBINS @0153  ON 11/06/21 SKL (NOTE) Extended spectrum beta-lactamase detected. Recommend a carbapenem as initial therapy.      Carbapenem  resistance IMP NOT DETECTED NOT DETECTED   Carbapenem resistance KPC NOT DETECTED NOT DETECTED   Carbapenem resistance NDM NOT DETECTED NOT DETECTED   Carbapenem resist OXA 48 LIKE NOT DETECTED NOT DETECTED   Carbapenem resistance VIM NOT DETECTED NOT DETECTED    Comment: Performed at [facility], [address] [place], [place] [address]  Hemoglobin A1c     Status: Abnormal   Collection Time: 11/05/21  2:03 PM  Result Value Ref Range   Hgb A1c MFr Bld 6.1 (H) 4.8 - 5.6 %    Comment: (NOTE)  Prediabetes: 5.7 - 6.4         Diabetes: >6.4         Glycemic control for adults with diabetes: <7.0    Mean Plasma Glucose 128 mg/dL    Comment: (NOTE) Performed At: [facility], [place] [id number] Rush Farmer MD WU:[id number]   Urinalysis, Complete w Microscopic Urine, Catheterized     Status: Abnormal   Collection Time: 11/05/21  2:29 PM  Result Value Ref Range   Color, Urine YELLOW (A) YELLOW   APPearance CLEAR (A) CLEAR   Specific Gravity, Urine 1.017 1.005 - 1.030   pH 5.0 5.0 - 8.0   Glucose, UA NEGATIVE NEGATIVE mg/dL   Hgb urine dipstick SMALL (A) NEGATIVE   Bilirubin Urine NEGATIVE NEGATIVE   Ketones, ur NEGATIVE NEGATIVE mg/dL   Protein, ur NEGATIVE NEGATIVE mg/dL   Nitrite NEGATIVE NEGATIVE   Leukocytes,Ua NEGATIVE NEGATIVE   RBC / HPF 6-10 0 - 5 RBC/hpf   WBC, UA 0-5 0 - 5 WBC/hpf   Bacteria, UA NONE SEEN NONE SEEN   Squamous Epithelial / LPF 0-5 0 - 5   Mucus PRESENT     Comment: Performed at [facility], [address], [place], [place] [address]  Urine Culture     Status: None   Collection Time: 11/05/21  2:29 PM   Specimen: Urine, Random  Result Value Ref Range   Specimen Description      URINE, RANDOM Performed at [facility], [address] [place], [place] [address]    Special Requests      NONE Performed at [facility], [address], [place], [place] [address]     Culture      NO GROWTH Performed at [facility], [place] [address] [place], [place] [address]    Report Status 11/07/2021 FINAL   Legionella Pneumophila Serogp 1 Ur Ag     Status: None   Collection Time: 11/05/21  2:29 PM  Result Value Ref Range   L. pneumophila Serogp 1 Ur Ag Negative Negative    Comment: (NOTE) Presumptive negative for L. pneumophila serogroup 1 antigen in urine, suggesting no recent or current infection. Legionnaires' disease cannot be ruled out since other serogroups and species may also cause disease. Performed At: [facility] [place], [place] [id number] Rush Farmer MD UU:[id number]    Source of Sample URINE, RANDOM     Comment: Performed at [facility], [address] [place], [place] [address]  Strep pneumoniae urinary antigen     Status: None   Collection Time: 11/05/21  2:29 PM  Result Value Ref Range   Strep Pneumo Urinary Antigen NEGATIVE NEGATIVE    Comment:        Infection due to S. pneumoniae cannot be absolutely ruled out since the antigen present may be below the detection limit of the test. Performed at [facility], [address], [place], [place] [address]   Lactic acid, plasma     Status: Abnormal   Collection Time: 11/05/21  7:55 PM  Result Value Ref Range   Lactic Acid, Venous 2.1 (HH) 0.5 - 1.9 mmol/L    Comment: CRITICAL RESULT CALLED TO, READ BACK BY AND VERIFIED WITH CRYSTAL DOLLARD AT 2032 ON 11/05/21 BY SS Performed at [facility], [address] [place], [place] [address]   Lactic acid, plasma     Status: None   Collection Time: 11/05/21 10:29 PM  Result Value Ref Range   Lactic Acid, Venous 1.7 0.5 - 1.9 mmol/L    Comment: Performed at [facility], [address] [place], [place] [address]  Basic metabolic panel     Status: Abnormal   Collection Time: 11/06/21  5:17 AM  Result Value Ref Range   Sodium 135 135 - 145 mmol/L   Potassium 4.2 3.5 - 5.1  mmol/L   Chloride 106 98 - 111 mmol/L   CO2 21 (L) 22 - 32 mmol/L   Glucose, Bld 146 (H) 70 - 99 mg/dL    Comment: Glucose reference range applies only to samples taken after fasting for at least 8 hours.   BUN 31 (H) 8 - 23 mg/dL   Creatinine, Ser 1.20 (H) 0.44 - 1.00 mg/dL   Calcium 8.6 (L) 8.9 - 10.3 mg/dL   GFR, Estimated 43 (L) >60 mL/min    Comment: (NOTE) Calculated using the CKD-EPI Creatinine Equation (2021)    Anion gap 8 5 - 15    Comment: Performed at [facility], [address] [place], [place] [address]  CBC     Status: Abnormal   Collection Time: 11/06/21  5:17 AM  Result Value Ref Range   WBC 7.0 4.0 - 10.5 K/uL   RBC 3.81 (L) 3.87 - 5.11 MIL/uL   Hemoglobin 10.5 (L) 12.0 - 15.0 g/dL   HCT 33.6 (L) 36.0 - 46.0 %   MCV 88.2 80.0 - 100.0 fL   MCH 27.6 26.0 - 34.0 pg   MCHC 31.3 30.0 - 36.0 g/dL   RDW 15.7 (H) 11.5 - 15.5 %   Platelets 126 (L) 150 - 400 K/uL    Comment: Immature Platelet Fraction may be clinically indicated, consider ordering this additional test [id number]    nRBC 0.0 0.0 - 0.2 %    Comment: Performed at [facility], [address] [place], [place] [address]  Hepatic function panel     Status: Abnormal   Collection Time: 11/06/21  5:17 AM  Result Value Ref Range   Total Protein 6.1 (L) 6.5 - 8.1 g/dL   Albumin 3.0 (L) 3.5 - 5.0 g/dL   AST 49 (H) 15 - 41 U/L   ALT 49 (H) 0 - 44 U/L   Alkaline Phosphatase 92 38 - 126 U/L   Total Bilirubin 0.9 0.3 - 1.2 mg/dL   Bilirubin, Direct 0.3 (H) 0.0 - 0.2 mg/dL   Indirect Bilirubin 0.6 0.3 - 0.9 mg/dL    Comment: Performed at [facility], [address] [place], [place] [address]  HCV RNA quant     Status: None   Collection Time: 11/06/21  5:29 PM  Result Value Ref Range   HCV Quantitative HCV Not Detected >50 IU/mL   Test Information Comment     Comment: (NOTE) The quantitative range of this assay is 15 IU/mL to 100 million IU/mL. Performed At: [facility] [place], [place] [id number] Rush Farmer MD EY:[id number]   Hepatitis B surface antigen     Status: None   Collection Time: 11/07/21  3:48 AM  Result Value Ref Range   Hepatitis B Surface Ag NON REACTIVE NON REACTIVE    Comment: Performed at [facility] [address] [place], [place] [address]  Hepatitis A antibody, total     Status: Abnormal   Collection Time: 11/07/21  3:48 AM  Result Value Ref Range   hep A Total Ab Reactive (A) NON REACTIVE  Comment: Performed at [facility], [place] [address] [place], [place] [address]  Hepatitis B surface antibody,quantitative     Status: Abnormal   Collection Time: 11/07/21  3:48 AM  Result Value Ref Range   Hepatitis B-Post 7.9 (L) Immunity>9.9 mIU/mL    Comment: (NOTE) **Verified by repeat analysis**  Status of Immunity                     Anti-HBs Level  ------------------                     -------------- Inconsistent with Immunity                   0.0 - 9.9 Consistent with Immunity                          >9.9 Performed At: [facility] [place], [place] [id number] Rush Farmer MD AS:[id number]   CBC     Status: Abnormal   Collection Time: 11/08/21  4:31 AM  Result Value Ref Range   WBC 8.4 4.0 - 10.5 K/uL   RBC 3.61 (L) 3.87 - 5.11 MIL/uL   Hemoglobin 10.0 (L) 12.0 - 15.0 g/dL   HCT 32.6 (L) 36.0 - 46.0 %   MCV 90.3 80.0 - 100.0 fL   MCH 27.7 26.0 - 34.0 pg   MCHC 30.7 30.0 - 36.0 g/dL   RDW 15.8 (H) 11.5 - 15.5 %   Platelets 148 (L) 150 - 400 K/uL   nRBC 0.0 0.0 - 0.2 %    Comment: Performed at [facility], [address] [place], [place] [address]  Basic metabolic panel     Status: Abnormal   Collection Time: 11/08/21  4:31 AM  Result Value Ref Range   Sodium 137 135 - 145 mmol/L   Potassium 3.9 3.5 - 5.1 mmol/L   Chloride 110 98 - 111 mmol/L   CO2 21 (L) 22 - 32 mmol/L   Glucose, Bld 98 70 - 99 mg/dL    Comment: Glucose reference range  applies only to samples taken after fasting for at least 8 hours.   BUN 31 (H) 8 - 23 mg/dL   Creatinine, Ser 1.08 (H) 0.44 - 1.00 mg/dL   Calcium 8.5 (L) 8.9 - 10.3 mg/dL   GFR, Estimated 49 (L) >60 mL/min    Comment: (NOTE) Calculated using the CKD-EPI Creatinine Equation (2021)    Anion gap 6 5 - 15    Comment: Performed at [facility], [address] [place], [place] [address]  CULTURE, BLOOD (ROUTINE X 2) w Reflex to ID Panel     Status: None   Collection Time: 11/08/21 12:17 PM   Specimen: BLOOD  Result Value Ref Range   Specimen Description BLOOD BLOOD RIGHT HAND    Special Requests      BOTTLES DRAWN AEROBIC AND ANAEROBIC Blood Culture adequate volume   Culture      NO GROWTH 5 DAYS Performed at [facility], [address] [place],  [address]    Report Status 11/13/2021 FINAL   CULTURE, BLOOD (ROUTINE X 2) w Reflex to ID Panel     Status: None   Collection Time: 11/08/21 12:26 PM   Specimen: BLOOD  Result Value Ref Range   Specimen Description BLOOD BLOOD LEFT HAND    Special Requests      BOTTLES DRAWN AEROBIC AND  ANAEROBIC Blood Culture adequate volume   Culture      NO GROWTH 5 DAYS Performed at [facility], [address] [place], [place] [address]    Report Status 11/13/2021 FINAL   CBC     Status: Abnormal   Collection Time: 11/10/21  4:38 AM  Result Value Ref Range   WBC 8.3 4.0 - 10.5 K/uL   RBC 3.92 3.87 - 5.11 MIL/uL   Hemoglobin 10.8 (L) 12.0 - 15.0 g/dL   HCT 34.6 (L) 36.0 - 46.0 %   MCV 88.3 80.0 - 100.0 fL   MCH 27.6 26.0 - 34.0 pg   MCHC 31.2 30.0 - 36.0 g/dL   RDW 15.5 11.5 - 15.5 %   Platelets 200 150 - 400 K/uL   nRBC 0.0 0.0 - 0.2 %    Comment: Performed at [facility], [address] [place], [place] [address]  Basic metabolic panel     Status: Abnormal   Collection Time: 11/10/21  4:38 AM  Result Value Ref Range   Sodium 138 135 - 145 mmol/L   Potassium 4.1 3.5 - 5.1 mmol/L    Chloride 104 98 - 111 mmol/L   CO2 27 22 - 32 mmol/L   Glucose, Bld 79 70 - 99 mg/dL    Comment: Glucose reference range applies only to samples taken after fasting for at least 8 hours.   BUN 29 (H) 8 - 23 mg/dL   Creatinine, Ser 1.10 (H) 0.44 - 1.00 mg/dL   Calcium 8.9 8.9 - 10.3 mg/dL   GFR, Estimated 48 (L) >60 mL/min    Comment: (NOTE) Calculated using the CKD-EPI Creatinine Equation (2021)    Anion gap 7 5 - 15    Comment: Performed at [facility], [address] [place], [place] [address]  CBC     Status: Abnormal   Collection Time: 11/11/21  4:51 AM  Result Value Ref Range   WBC 9.8 4.0 - 10.5 K/uL   RBC 4.01 3.87 - 5.11 MIL/uL   Hemoglobin 10.9 (L) 12.0 - 15.0 g/dL   HCT 35.0 (L) 36.0 - 46.0 %   MCV 87.3 80.0 - 100.0 fL   MCH 27.2 26.0 - 34.0 pg   MCHC 31.1 30.0 - 36.0 g/dL   RDW 15.4 11.5 - 15.5 %   Platelets 198 150 - 400 K/uL   nRBC 0.0 0.0 - 0.2 %    Comment: Performed at [facility], [address] [place], [place] [address]  Basic metabolic panel     Status: Abnormal   Collection Time: 11/11/21  4:51 AM  Result Value Ref Range   Sodium 138 135 - 145 mmol/L   Potassium 4.0 3.5 - 5.1 mmol/L   Chloride 103 98 - 111 mmol/L   CO2 30 22 - 32 mmol/L   Glucose, Bld 86 70 - 99 mg/dL    Comment: Glucose reference range applies only to samples taken after fasting for at least 8 hours.   BUN 27 (H) 8 - 23 mg/dL   Creatinine, Ser 0.92 0.44 - 1.00 mg/dL   Calcium 9.0 8.9 - 10.3 mg/dL   GFR, Estimated 59 (L) >60 mL/min    Comment: (NOTE) Calculated using the CKD-EPI Creatinine Equation (2021)    Anion gap 5 5 - 15    Comment: Performed at [facility], [address], [place], [place] [address]  Cytology - Non PAP;     Status: None   Collection Time:  11/11/21  2:58 PM  Result Value Ref Range   CYTOLOGY - NON GYN      CYTOLOGY - NON PAP CASE: [id number] PATIENT: Tiyah Curtiss Non-Gynecological Cytology Report     Specimen  Submitted: A. Lung, RUL; brushing  Clinical History: None provided    DIAGNOSIS: A. LUNG, RIGHT UPPER LOBE; BRONCHIAL BRUSHING: - NOT DIAGNOSTIC OF MALIGNANCY. - BENIGN BRONCHIAL EPITHELIAL CELLS AND BLOOD, WITH FEW MACROPHAGES AND CHRONIC INFLAMMATORY CELLS.  Comment: Recommend correlation with results of concurrent right upper lobe FNA ([id number]).  Slides reviewed: 1 ThinPrep, 1 cell block, 8 Diff-Quik  GROSS DESCRIPTION: A. Site: RUL, lung Procedure: ENB Cytology specimen: Brushing Cytotechnologist(s): Ashlee Howze-Soremekun, Orpha Bur Specimen material collected and submitted for: 8 diff Quik stained slides 0 Pap stained slides Specimen material submitted for: Cell block and ThinPrep  The cell block material is fixed in formalin for 6 hours prior to processing.  Specimen description: Fixative: CytoLyt Volume: Appr oximately 15 mL Color: Slightly pink Transparency: Clear Tissue fragments present: Yes Collection brush(es) present: Yes, 1  RB 11/11/2021    Final Diagnosis performed by Allena Napoleon, MD.   Electronically signed 11/13/2021 2:11:50PM The electronic signature indicates that the named Attending Pathologist has evaluated the specimen Technical component performed at [facility], [address], [place], [place] [address] Lab: [phone] Dir: Rush Farmer, MD, MMM  Professional component performed at [facility], [facility], [place], [place], [place] [address] Lab: [phone] Dir: Kathi Simpers, MD   Cytology - Non PAP;     Status: None   Collection Time: 11/11/21  3:04 PM  Result Value Ref Range   CYTOLOGY - NON GYN      CYTOLOGY - NON PAP CASE: [id number] PATIENT: Georgene Banik Non-Gynecological Cytology Report     Specimen Submitted: A. Station 7; FNA  Clinical History: None provided    DIAGNOSIS: A. LYMPH NODE, STATION 7; EBUS GUIDED FINE-NEEDLE ASPIRATION: - NOT DIAGNOSTIC OF  MALIGNANCY. - BLOOD AND MIXED INFLAMMATORY CELLS WITH RARE BENIGN BRONCHIAL EPITHELIAL CELLS.  Comment: The specimen represents adequate lymph node sampling for staging.  Slides reviewed: 1 ThinPrep, 1 cell block  GROSS DESCRIPTION: A. Site: Station 7 Procedure: EBUS Cytology specimen: FNA/needle biopsy, rinse Cytotechnologist(s): Ashlee Howze-Soremekun, Chauncey Cruel, Veryl Speak [employer] collected and submitted for: 0 diff Quik stained slides 0 Pap stained slides Specimen material submitted for: Cell block and ThinPrep  The cell block material is fixed in formalin for 6 hours prior to processing.  Specimen description: Fixative: CytoLyt Volume: Approximately 30 mL Color:  Red Transparency: Slightly cloudy Tissue fragments present: Yes Collection brush(es) present: No  RB 11/11/2021    Final Diagnosis performed by Allena Napoleon, MD.   Electronically signed 11/13/2021 2:14:29PM The electronic signature indicates that the named Attending Pathologist has evaluated the specimen Technical component performed at [facility], [address] [place], [place] [address] Lab: [phone] Dir: Rush Farmer, MD, MMM  Professional component performed at [facility], [facility], [place] [address], [place], [place] [address] Lab: [phone] Dir: Kathi Simpers, MD   Surgical pathology     Status: None   Collection Time: 11/11/21  3:24 PM  Result Value Ref Range   SURGICAL PATHOLOGY      SURGICAL PATHOLOGY CASE: [id number] PATIENT: Aliesha Morre Surgical Pathology Report     Specimen Submitted: A. Lung, RUL  Clinical History: None provided    DIAGNOSIS: A. LUNG, RIGHT UPPER LOBE; ENB GUIDED BIOPSY: - RARE ATYPICAL CELLS, NOT DIAGNOSTIC OF  MALIGNANCY, SEEN ON TOUCH PREPARATIONS. - BIOPSY SECTIONS SHOW BENIGN BRONCHIAL WALL AND ALVEOLATED LUNG PARENCHYMA.  Comment: Recommend correlation with results of concurrent right upper lobe  FNA ([id number]).  GROSS DESCRIPTION: A. Labeled: RUL biopsy Received: The specimen is collected during an ENB procedure and placed in formalin. Collection time: 2 PM on 11/11/2021 Placed into formalin time: 2 PM on 11/11/2021 Tissue fragment(s): Multiple Size: Aggregate, 0.8 x 0.3 x 0.2 cm Description: Received are red to tan soft tissue fragments.  At the time of the procedures 6 Diff-Quik stained slides are performed. Entirely submitted in 1 cassette.  RB 11/11/2021  Final Diagnosis performed by Sherlie Ban, MD.   Electronically signed 11/13/2021 2:22:01PM The electronic signature indicates that the named Attending Pathologist has evaluated the specimen Technical component performed at [facility], [address] [place], [place] [address] Lab: [phone] Dir: Rush Farmer, MD, MMM  Professional component performed at [facility], [facility], [place], [place], [place] [address] Lab: [phone] Dir: Kathi Simpers, MD   Cytology - Non PAP;     Status: None   Collection Time: 11/11/21  3:25 PM  Result Value Ref Range   CYTOLOGY - NON GYN      CYTOLOGY - NON PAP CASE: [id number] PATIENT: Jemimah Massiah Non-Gynecological Cytology Report     Specimen Submitted: A. Lung, RUL; FNA  Clinical History: None provided    DIAGNOSIS: A. LUNG, RIGHT UPPER LOBE; EBUS GUIDED FINE-NEEDLE ASPIRATION: - POSITIVE FOR MALIGNANCY. - SMALL CELL NEUROENDOCRINE CARCINOMA.  Comment: Immunohistochemical studies show tumor cells to be positive for CD56 and TTF-1, with only focal, dim, blush marking for chromogranin. The histomorphologic features are best seen on the Diff-Quik smear, and include high N:C ratio, nuclear molding, and crush artifact/nuclear smearing. Together, these findings support the above diagnosis.  There is insufficient tissue present within the cellblock for ancillary testing  Slides reviewed: 1 ThinPrep, 1 cell block, 1 Diff-Quik, 1 Pap  stain  IHC slides were prepared by [facility], [place], [place]. All controls stained appropriately.  This test was developed and  its performance characteristics determined by [facility]. It has not been cleared or approved by the [name] Food and Drug Administration. The FDA does not require this test to go through premarket FDA review. This test is used for clinical purposes. It should not be regarded as investigational or for research. This laboratory is certified under the Clinical Laboratory Improvement Amendments (CLIA) as qualified to perform high complexity clinical laboratory testing.  GROSS DESCRIPTION: A. Site: RUL, lung Procedure: EBUS Cytology specimen: FNA/needle biopsy, rinse Cytotechnologist(s): Ashlee Howze-Soremekun, Orpha Bur Specimen material collected and submitted for: 1 diff Quik stained slides 1 Pap stained slides Specimen material submitted for: Cell block and ThinPrep  The cell block material is fixed in formalin for 6 hours prior to processing.  Specimen description: Fixative: CytoLyt Volume: Approximately 30 mL Color: Pink Transparency: Clear Tissu e fragments present: Yes Collection brush(es) present: No  RB 11/11/2021    Final Diagnosis performed by Allena Napoleon, MD.   Electronically signed 11/13/2021 2:12:47PM The electronic signature indicates that the named Attending Pathologist has evaluated the specimen Technical component performed at [facility], [address], [place], [place] [address] Lab: [phone] Dir: Rush Farmer, MD, MMM  Professional component performed at [facility], [facility], [place], [place], [place] [address] Lab: [phone] Dir: Kathi Simpers, MD   Cytology - Non PAP;     Status: None  Collection Time: 11/11/21  3:25 PM  Result Value Ref Range   CYTOLOGY - NON GYN      CYTOLOGY - NON PAP CASE: [id number] PATIENT: Adaeze  Casebeer Non-Gynecological Cytology Report     Specimen Submitted: A. 10 R; FNA  Clinical History: None provided    DIAGNOSIS: A. LYMPH NODE, STATION 10 R; EBUS GUIDED FINE-NEEDLE ASPIRATION: - POSITIVE FOR MALIGNANCY. 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  Comment: The specimen represents adequate lymph node sampling for staging. 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 There is insufficient tissue present for further ancillary testing.  Slides reviewed: 1 ThinPrep, 1 cell block  GROSS DESCRIPTION:  A. Site: 10 R Procedure: EBUS Cytology specimen: FNA/needle biopsy, rinse Cytotechnologist(s): Ashlee Howze-Soremekun, Orpha Bur Specimen material collecte d and submitted for: 0 diff Quik stained slides 0 Pap stained slides Specimen material submitted for: Cell block and ThinPrep  The cell block material is fixed in formalin for 6 hours prior to processing.  Specimen description: Fixative: CytoLyt Volume: Approximately 15 mL Color: Pink Transparency: Clear Tissue fragments present: Yes Collection brush(es) present: No  RB 11/11/2021    Final Diagnosis performed by Allena Napoleon, MD.   Electronically signed 11/16/2021 2:31:24PM The electronic signature indicates that the named Attending Pathologist has evaluated the specimen Technical component performed at [facility], [address] [place], [place] [address] Lab: [phone] Dir: Rush Farmer, MD, MMM  Professional component performed at [facility], [facility], [place], [place], [place] [address] Lab: [phone] Dir: Kathi Simpers, MD   CBC     Status: Abnormal   Collection Time: 11/25/21  9:20 AM  Result Value Ref Range   WBC 8.7 4.0  - 10.5 K/uL   RBC 5.18 (H) 3.87 - 5.11 MIL/uL   Hemoglobin 14.1 12.0 - 15.0 g/dL   HCT 45.6 36.0 - 46.0 %   MCV 88.0 80.0 - 100.0 fL   MCH 27.2 26.0 - 34.0 pg   MCHC 30.9 30.0 - 36.0 g/dL   RDW 15.4 11.5 - 15.5 %   Platelets 157 150 - 400 K/uL   nRBC 0.0 0.0 - 0.2 %    Comment: Performed at [facility], [address] [place],  [address]  Comprehensive metabolic panel     Status: Abnormal   Collection Time: 11/25/21  9:20 AM  Result Value Ref Range   Sodium 138 135 - 145 mmol/L   Potassium 4.7 3.5 - 5.1 mmol/L   Chloride 103 98 - 111 mmol/L   CO2 25 22 - 32 mmol/L   Glucose, Bld 98 70 - 99 mg/dL    Comment: Glucose reference range applies only to samples taken after fasting for at least 8 hours.   BUN 33 (H) 8 - 23 mg/dL   Creatinine, Ser 1.63 (H) 0.44 - 1.00 mg/dL   Calcium 9.4 8.9 - 10.3 mg/dL   Total Protein 8.0 6.5 - 8.1 g/dL   Albumin 3.8 3.5 - 5.0 g/dL   AST 33 15 - 41 U/L   ALT 18 0 - 44 U/L   Alkaline Phosphatase 85 38 - 126 U/L   Total Bilirubin 1.4 (H) 0.3 - 1.2 mg/dL   GFR, Estimated 30 (L) >60 mL/min    Comment: (NOTE) Calculated using the CKD-EPI Creatinine Equation (2021)    Anion gap 10  5 - 15    Comment: Performed at [facility], [address] [place], [place] [address]  Urinalysis, Complete w Microscopic Urine, Clean Catch     Status: Abnormal   Collection Time: 11/25/21  9:20 AM  Result Value Ref Range   Color, Urine YELLOW (A) YELLOW   APPearance CLEAR (A) CLEAR   Specific Gravity, Urine 1.025 1.005 - 1.030   pH 8.0 5.0 - 8.0   Glucose, UA NEGATIVE NEGATIVE mg/dL   Hgb urine dipstick NEGATIVE NEGATIVE   Bilirubin Urine NEGATIVE NEGATIVE   Ketones, ur NEGATIVE NEGATIVE mg/dL   Protein, ur NEGATIVE NEGATIVE mg/dL   Nitrite NEGATIVE NEGATIVE   Leukocytes,Ua NEGATIVE NEGATIVE   RBC / HPF 6-10 0 - 5 RBC/hpf   WBC, UA 0-5 0 - 5 WBC/hpf   Bacteria, UA NONE SEEN NONE SEEN   Squamous Epithelial / LPF 0-5 0 - 5    Comment:  Performed at [facility], [address], [place], [place] [address]  Resp Panel by RT-PCR (Flu A&B, Covid) Nasopharyngeal Swab     Status: None   Collection Time: 11/25/21  9:20 AM   Specimen: Nasopharyngeal Swab; Nasopharyngeal(NP) swabs in vial transport medium  Result Value Ref Range   SARS Coronavirus 2 by RT PCR NEGATIVE NEGATIVE    Comment: (NOTE) SARS-CoV-2 target nucleic acids are NOT DETECTED.  The SARS-CoV-2 RNA is generally detectable in upper respiratory specimens during the acute phase of infection. The lowest concentration of SARS-CoV-2 viral copies this assay can detect is 138 copies/mL. A negative result does not preclude SARS-Cov-2 infection and should not be used as the sole basis for treatment or other patient management decisions. A negative result may occur with  improper specimen collection/handling, submission of specimen other than nasopharyngeal swab, presence of viral mutation(s) within the areas targeted by this assay, and inadequate number of viral copies(<138 copies/mL). A negative result must be combined with clinical observations, patient history, and epidemiological information. The expected result is Negative.  Fact Sheet for Patients:  EntrepreneurPulse.com.au  Fact Sheet for Healthcare Providers:  IncredibleEmployment.be  This test is no t yet approved or cleared by the [place] FDA and  has been authorized for detection and/or diagnosis of SARS-CoV-2 by FDA under an Emergency Use Authorization (EUA). This EUA will remain  in effect (meaning this test can be used) for the duration of the COVID-19 declaration under Section 564(b)(1) of the Act, 21 U.S.C.section 360bbb-3(b)(1), unless the authorization is terminated  or revoked sooner.       Influenza A by PCR NEGATIVE NEGATIVE   Influenza B by PCR NEGATIVE NEGATIVE    Comment: (NOTE) The Xpert Xpress SARS-CoV-2/FLU/RSV plus assay is intended  as an aid in the diagnosis of influenza from Nasopharyngeal swab specimens and should not be used as a sole basis for treatment. Nasal washings and aspirates are unacceptable for Xpert Xpress SARS-CoV-2/FLU/RSV testing.  Fact Sheet for Patients: EntrepreneurPulse.com.au  Fact Sheet for Healthcare Providers: IncredibleEmployment.be  This test is not yet approved or cleared by the [place] FDA and has been authorized for detection and/or diagnosis of SARS-CoV-2 by FDA under an Emergency Use Authorization (EUA). This EUA will remain in effect (meaning this test can be used) for the duration of the COVID-19 declaration under Section 564(b)(1) of the Act, 21 U.S.C. section 360bbb-3(b)(1), unless the authorization is terminated or revoked.  Performed at [facility], [address] [place], [place] [address]   [place] Session Summary  Status: None   Collection Time: 12/08/21  1:22 PM  Result Value Ref Range   Course ID [email or web address]    Course Intent Curative    Course Start Date 12/01/2021  2:38 PM    Session Number 1    Course First Treatment Date 12/08/2021  1:19 PM    Course Last Treatment Date 12/08/2021  1:20 PM    Course Elapsed Days 0    Reference Point ID Lung UHRT DP    Reference Point Dosage Given to Date 6.00000001 Gy   Reference Point Session Dosage Given [id number] Gy   Plan ID [id number]    Plan Fractions Treated to Date 1    Plan Total Fractions Prescribed 10    Plan Prescribed Dose Per Fraction 6 Gy   Plan Total Prescribed Dose 60.000000 Gy   Plan Primary Reference Point Lung [place] DP   Rad Onc [place] Session Summary     Status: None   Collection Time: 12/09/21  1:44 PM  Result Value Ref Range   Course ID [email or web address]    Course Intent Curative    Course Start Date 12/01/2021  2:38 PM    Session Number 2    Course First Treatment Date 12/08/2021  1:19 PM    Course Last Treatment Date 12/09/2021  1:43 PM    Course  Elapsed Days 1    Reference Point ID Lung UHRT DP    Reference Point Dosage Given to Date 12.00000002 Gy   Reference Point Session Dosage Given 1.30865784 Gy   Plan ID [email or web address]    Plan Fractions Treated to Date 2    Plan Total Fractions Prescribed 10    Plan Prescribed Dose Per Fraction 6 Gy   Plan Total Prescribed Dose 60.000000 Gy   Plan Primary Reference Point Lung [place] DP   Rad Onc Aria Session Summary     Status: None   Collection Time: 12/10/21  3:35 PM  Result Value Ref Range   Course ID [email or web address]    Course Intent Curative    Course Start Date 12/01/2021  2:38 PM    Session Number 3    Course First Treatment Date 12/08/2021  1:19 PM    Course Last Treatment Date 12/10/2021  3:33 PM    Course Elapsed Days 2    Reference Point ID Lung UHRT DP    Reference Point Dosage Given to Date 18.00000003 Gy   Reference Point Session Dosage Given 6.96295284 Gy   Plan ID [email or web address]    Plan Fractions Treated to Date 3    Plan Total Fractions Prescribed 10    Plan Prescribed Dose Per Fraction 6 Gy   Plan Total Prescribed Dose 60.000000 Gy   Plan Primary Reference Point Lung [place] DP   Rad Sandria Senter Session Summary     Status: None   Collection Time: 12/11/21 11:37 AM  Result Value Ref Range   Course ID [email or web address]    Course Intent Curative    Course Start Date 12/01/2021  2:38 PM    Session Number 4    Course First Treatment Date 12/08/2021  1:19 PM    Course Last Treatment Date 12/11/2021 11:35 AM    Course Elapsed Days 3    Reference Point ID Lung UHRT DP    Reference Point Dosage Given to Date 24.00000004 Gy   Reference Point Session Dosage Given 6.00000001 Gy   Plan ID [email or web address]    Plan Fractions Treated to Date 4    Plan Total Fractions Prescribed  10    Plan Prescribed Dose Per Fraction 6 Gy   Plan Total Prescribed Dose 60.000000 Gy   Plan Primary Reference Point Lung UHRT DP   Basic metabolic panel     Status: Abnormal   Collection Time: 12/11/21 12:24 PM  Result Value Ref  Range   Sodium 139 135 - 145 mmol/L   Potassium 4.7 3.5 - 5.1 mmol/L   Chloride 108 98 - 111 mmol/L   CO2 22 22 - 32 mmol/L   Glucose, Bld 75 70 - 99 mg/dL    Comment: Glucose reference range applies only to samples taken after fasting for at least 8 hours.   BUN 25 (H) 8 - 23 mg/dL   Creatinine, Ser 1.23 (H) 0.44 - 1.00 mg/dL   Calcium 9.3 8.9 - 10.3 mg/dL   GFR, Estimated 42 (L) >60 mL/min    Comment: (NOTE) Calculated using the CKD-EPI Creatinine Equation (2021)    Anion gap 9 5 - 15    Comment: Performed at [facility], [address] [place], [place] [address]  Brain natriuretic peptide     Status: Abnormal   Collection Time: 12/11/21 12:24 PM  Result Value Ref Range   B Natriuretic Peptide 1,129.5 (H) 0.0 - 100.0 pg/mL    Comment: Performed at [facility], [address] [place], [place] [address]  Rad Sandria Senter Session Summary     Status: None   Collection Time: 12/14/21  1:59 PM  Result Value Ref Range   Course ID [email or web address]    Course Intent Curative    Course Start Date 12/01/2021  2:38 PM    Session Number 5    Course First Treatment Date 12/08/2021  1:19 PM    Course Last Treatment Date 12/14/2021  1:57 PM    Course Elapsed Days 6    Reference Point ID Lung UHRT DP    Reference Point Dosage Given to Date 30.00000005 Gy   Reference Point Session Dosage Given 7.25366440 Gy   Plan ID [email or web address]    Plan Fractions Treated to Date 5    Plan Total Fractions Prescribed 10    Plan Prescribed Dose Per Fraction 6 Gy   Plan Total Prescribed Dose 60.000000 Gy   Plan Primary Reference Point Lung UHRT DP   Basic metabolic panel     Status: Abnormal   Collection Time: 12/14/21  2:54 PM  Result Value Ref Range   Sodium 141 135 - 145 mmol/L   Potassium 3.9 3.5 - 5.1 mmol/L   Chloride 105 98 - 111 mmol/L   CO2 25 22 - 32 mmol/L   Glucose, Bld 73 70 - 99 mg/dL    Comment: Glucose reference range applies only to samples taken after fasting for at least 8  hours.   BUN 33 (H) 8 - 23 mg/dL   Creatinine, Ser 1.16 (H) 0.44 - 1.00 mg/dL   Calcium 9.7 8.9 - 10.3 mg/dL   GFR, Estimated 45 (L) >60 mL/min    Comment: (NOTE) Calculated using the CKD-EPI Creatinine Equation (2021)    Anion gap 11 5 - 15    Comment: Performed at [facility], [address] [place], [place] [address]  Rad Sandria Senter Session Summary     Status: None   Collection Time: 12/15/21  1:53 PM  Result Value Ref Range   Course ID [email or web address]    Course Intent Curative    Course Start Date 12/01/2021  2:38 PM    Session Number  6    Course First Treatment Date 12/08/2021  1:19 PM    Course Last Treatment Date 12/15/2021  1:52 PM    Course Elapsed Days 7    Reference Point ID Lung UHRT DP    Reference Point Dosage Given to Date 36.00000006 Gy   Reference Point Session Dosage Given 9.38182993 Gy   Plan ID [email or web address]    Plan Fractions Treated to Date 6    Plan Total Fractions Prescribed 10    Plan Prescribed Dose Per Fraction 6 Gy   Plan Total Prescribed Dose 60.000000 Gy   Plan Primary Reference Point Lung [place] DP   Rad Onc Aria Session Summary     Status: None   Collection Time: 12/16/21 11:00 AM  Result Value Ref Range   Course ID [email or web address]    Course Intent Curative    Course Start Date 12/01/2021  2:38 PM    Session Number 7    Course First Treatment Date 12/08/2021  1:19 PM    Course Last Treatment Date 12/16/2021 10:59 AM    Course Elapsed Days 8    Reference Point ID Lung UHRT DP    Reference Point Dosage Given to Date 42.00000007 Gy   Reference Point Session Dosage Given 7.16967893 Gy   Plan ID [email or web address]    Plan Fractions Treated to Date 7    Plan Total Fractions Prescribed 10    Plan Prescribed Dose Per Fraction 6 Gy   Plan Total Prescribed Dose 60.000000 Gy   Plan Primary Reference Point Lung [place] DP   Rad Onc Aria Session Summary     Status: None   Collection Time: 12/17/21  1:43 PM  Result Value Ref Range   Course ID C1_Chest    Course Intent  Curative    Course Start Date 12/01/2021  2:38 PM    Session Number 8    Course First Treatment Date 12/08/2021  1:19 PM    Course Last Treatment Date 12/17/2021  1:41 PM    Course Elapsed Days 9    Reference Point ID Lung UHRT DP    Reference Point Dosage Given to Date 48.00000008 Gy   Reference Point Session Dosage Given 6.00000001 Gy   Plan ID [email or web address]    Plan Fractions Treated to Date 8    Plan Total Fractions Prescribed 10    Plan Prescribed Dose Per Fraction 6 Gy   Plan Total Prescribed Dose 60.000000 Gy   Plan Primary Reference Point Lung UHRT DP   Blood gas, venous     Status: None   Collection Time: 12/17/21  9:01 PM  Result Value Ref Range   pH, Ven 7.35 7.25 - 7.43   pCO2, Ven 45 44 - 60 mmHg   Bicarbonate 24.8 20.0 - 28.0 mmol/L   Acid-base deficit 1.1 0.0 - 2.0 mmol/L   O2 Saturation 24.7 %   Patient temperature 37.0    Collection site VEIN     Comment: Performed at [facility], [address] [place], [place] [address]  Blood culture (routine x 2)     Status: None   Collection Time: 12/17/21  9:04 PM   Specimen: BLOOD  Result Value Ref Range   Specimen Description BLOOD RIGHT ASSIST CONTROL    Special Requests      BOTTLES DRAWN AEROBIC AND ANAEROBIC Blood Culture adequate volume   Culture      NO GROWTH 5 DAYS Performed at [facility], [address] [place], [place] [address]  Report Status 12/22/2021 FINAL   Lactic acid, plasma     Status: None   Collection Time: 12/17/21  9:04 PM  Result Value Ref Range   Lactic Acid, Venous 1.9 0.5 - 1.9 mmol/L    Comment: Performed at [facility], [address] [place], [place] [phone]  Resp Panel by RT-PCR (Flu A&B, Covid) Nasopharyngeal Swab     Status: None   Collection Time: 12/17/21  9:04 PM   Specimen: Nasopharyngeal Swab; Nasopharyngeal(NP) swabs in vial transport medium  Result Value Ref Range   SARS Coronavirus 2 by RT PCR NEGATIVE NEGATIVE    Comment:  (NOTE) SARS-CoV-2 target nucleic acids are NOT DETECTED.  The SARS-CoV-2 RNA is generally detectable in upper respiratory specimens during the acute phase of infection. The lowest concentration of SARS-CoV-2 viral copies this assay can detect is 138 copies/mL. A negative result does not preclude SARS-Cov-2 infection and should not be used as the sole basis for treatment or other patient management decisions. A negative result may occur with  improper specimen collection/handling, submission of specimen other than nasopharyngeal swab, presence of viral mutation(s) within the areas targeted by this assay, and inadequate number of viral copies(<138 copies/mL). A negative result must be combined with clinical observations, patient history, and epidemiological information. The expected result is Negative.  Fact Sheet for Patients:  EntrepreneurPulse.com.au  Fact Sheet for Healthcare Providers:  IncredibleEmployment.be  This test is no t yet approved or cleared by the [place] FDA and  has been authorized for detection and/or diagnosis of SARS-CoV-2 by FDA under an Emergency Use Authorization (EUA). This EUA will remain  in effect (meaning this test can be used) for the duration of the COVID-19 declaration under Section 564(b)(1) of the Act, 21 U.S.C.section 360bbb-3(b)(1), unless the authorization is terminated  or revoked sooner.       Influenza A by PCR NEGATIVE NEGATIVE   Influenza B by PCR NEGATIVE NEGATIVE    Comment: (NOTE) The Xpert Xpress SARS-CoV-2/FLU/RSV plus assay is intended as an aid in the diagnosis of influenza from Nasopharyngeal swab specimens and should not be used as a sole basis for treatment. Nasal washings and aspirates are unacceptable for Xpert Xpress SARS-CoV-2/FLU/RSV testing.  Fact Sheet for Patients: EntrepreneurPulse.com.au  Fact Sheet for Healthcare  Providers: IncredibleEmployment.be  This test is not yet approved or cleared by the [place] FDA and has been authorized for detection and/or diagnosis of SARS-CoV-2 by FDA under an Emergency Use Authorization (EUA). This EUA will remain in effect (meaning this test can be used) for the duration of the COVID-19 declaration under Section 564(b)(1) of the Act, 21 U.S.C. section 360bbb-3(b)(1), unless the authorization is terminated or revoked.  Performed at [facility], [address] [place], [place] [address]   Blood culture (routine x 2)     Status: None   Collection Time: 12/17/21  9:05 PM   Specimen: BLOOD  Result Value Ref Range   Specimen Description BLOOD LEFT ASSIST CONTROL    Special Requests      BOTTLES DRAWN AEROBIC AND ANAEROBIC Blood Culture adequate volume   Culture      NO GROWTH 5 DAYS Performed at [facility], [address] [place], [place] [address]    Report Status 12/22/2021 FINAL   CBC with Differential     Status: Abnormal   Collection Time: 12/17/21  9:13 PM  Result Value Ref Range   WBC 8.7 4.0 - 10.5 K/uL   RBC 3.83 (L)  3.87 - 5.11 MIL/uL   Hemoglobin 10.5 (L) 12.0 - 15.0 g/dL   HCT 34.2 (L) 36.0 - 46.0 %   MCV 89.3 80.0 - 100.0 fL   MCH 27.4 26.0 - 34.0 pg   MCHC 30.7 30.0 - 36.0 g/dL   RDW 17.3 (H) 11.5 - 15.5 %   Platelets 163 150 - 400 K/uL   nRBC 0.0 0.0 - 0.2 %   Neutrophils Relative % 85 %   Neutro Abs 7.6 1.7 - 7.7 K/uL   Lymphocytes Relative 7 %   Lymphs Abs 0.6 (L) 0.7 - 4.0 K/uL   Monocytes Relative 5 %   Monocytes Absolute 0.4 0.1 - 1.0 K/uL   Eosinophils Relative 1 %   Eosinophils Absolute 0.1 0.0 - 0.5 K/uL   Basophils Relative 1 %   Basophils Absolute 0.0 0.0 - 0.1 K/uL   Immature Granulocytes 1 %   Abs Immature Granulocytes 0.04 0.00 - 0.07 K/uL    Comment: Performed at [facility], [address] [place], [place] [address]  Comprehensive metabolic panel     Status:  Abnormal   Collection Time: 12/17/21  9:13 PM  Result Value Ref Range   Sodium 137 135 - 145 mmol/L   Potassium 3.9 3.5 - 5.1 mmol/L   Chloride 104 98 - 111 mmol/L   CO2 24 22 - 32 mmol/L   Glucose, Bld 108 (H) 70 - 99 mg/dL    Comment: Glucose reference range applies only to samples taken after fasting for at least 8 hours.   BUN 34 (H) 8 - 23 mg/dL   Creatinine, Ser 1.33 (H) 0.44 - 1.00 mg/dL   Calcium 9.1 8.9 - 10.3 mg/dL   Total Protein 7.4 6.5 - 8.1 g/dL   Albumin 3.5 3.5 - 5.0 g/dL   AST 38 15 - 41 U/L   ALT 26 0 - 44 U/L   Alkaline Phosphatase 102 38 - 126 U/L   Total Bilirubin 1.1 0.3 - 1.2 mg/dL   GFR, Estimated 38 (L) >60 mL/min    Comment: (NOTE) Calculated using the CKD-EPI Creatinine Equation (2021)    Anion gap 9 5 - 15    Comment: Performed at [facility], [place], [place] [address]  Troponin I (High Sensitivity)     Status: Abnormal   Collection Time: 12/17/21  9:13 PM  Result Value Ref Range   Troponin I (High Sensitivity) 65 (H) <18 ng/L    Comment: (NOTE) Elevated high sensitivity troponin I (hsTnI) values and significant  changes across serial measurements may suggest ACS but many other  chronic and acute conditions are known to elevate hsTnI results.  Refer to the "Links" section for chest pain algorithms and additional  guidance. Performed at [facility], [address] [place], [place] [address]   Brain natriuretic peptide     Status: Abnormal   Collection Time: 12/17/21  9:13 PM  Result Value Ref Range   B Natriuretic Peptide 1,454.7 (H) 0.0 - 100.0 pg/mL    Comment: Performed at [facility], [place], [place] [address]  Troponin I (High Sensitivity)     Status: Abnormal   Collection Time: 12/18/21 12:38 AM  Result Value Ref Range   Troponin I (High Sensitivity) 63 (H) <18 ng/L    Comment: (NOTE) Elevated high sensitivity troponin I (hsTnI) values and significant  changes across  serial measurements may suggest ACS but many other  chronic and acute conditions are  known to elevate hsTnI results.  Refer to the "Links" section for chest pain algorithms and additional  guidance. Performed at [facility], [address] [place], [place] [address]   Urinalysis, Complete w Microscopic     Status: Abnormal   Collection Time: 12/18/21  6:28 AM  Result Value Ref Range   Color, Urine YELLOW (A) YELLOW   APPearance CLEAR (A) CLEAR   Specific Gravity, Urine 1.032 (H) 1.005 - 1.030   pH 5.0 5.0 - 8.0   Glucose, UA 150 (A) NEGATIVE mg/dL   Hgb urine dipstick NEGATIVE NEGATIVE   Bilirubin Urine NEGATIVE NEGATIVE   Ketones, ur NEGATIVE NEGATIVE mg/dL   Protein, ur NEGATIVE NEGATIVE mg/dL   Nitrite NEGATIVE NEGATIVE   Leukocytes,Ua SMALL (A) NEGATIVE   RBC / HPF 0-5 0 - 5 RBC/hpf   WBC, UA 0-5 0 - 5 WBC/hpf   Bacteria, UA NONE SEEN NONE SEEN   Squamous Epithelial / LPF 0-5 0 - 5    Comment: Performed at [facility], [address] [place], [place] [address]  Urine Culture     Status: None   Collection Time: 12/18/21  6:28 AM   Specimen: In/Out Cath Urine  Result Value Ref Range   Specimen Description      IN/OUT CATH URINE Performed at [facility], [address], [place], [place] [address]    Special Requests      NONE Performed at [facility], [address], [place], [place] [address]    Culture      NO GROWTH Performed at [facility], [place] [address] [place], [place] [address]    Report Status 12/19/2021 FINAL   Comprehensive metabolic panel     Status: Abnormal   Collection Time: 12/18/21  6:28 AM  Result Value Ref Range   Sodium 136 135 - 145 mmol/L   Potassium 4.4 3.5 - 5.1 mmol/L    Comment: HEMOLYSIS AT THIS LEVEL MAY AFFECT RESULT   Chloride 107 98 - 111 mmol/L   CO2 19 (L) 22 - 32 mmol/L   Glucose, Bld 97 70 - 99 mg/dL    Comment: Glucose reference range applies only to samples taken after  fasting for at least 8 hours.   BUN 32 (H) 8 - 23 mg/dL   Creatinine, Ser 1.28 (H) 0.44 - 1.00 mg/dL   Calcium 8.6 (L) 8.9 - 10.3 mg/dL   Total Protein 6.9 6.5 - 8.1 g/dL   Albumin 3.1 (L) 3.5 - 5.0 g/dL   AST 168 (H) 15 - 41 U/L   ALT 83 (H) 0 - 44 U/L   Alkaline Phosphatase 145 (H) 38 - 126 U/L   Total Bilirubin 1.1 0.3 - 1.2 mg/dL   GFR, Estimated 40 (L) >60 mL/min    Comment: (NOTE) Calculated using the CKD-EPI Creatinine Equation (2021)    Anion gap 10 5 - 15    Comment: Performed at [facility], [address] [place], [place] [address]  CBC     Status: Abnormal   Collection Time: 12/18/21  6:28 AM  Result Value Ref Range   WBC 9.4 4.0 - 10.5 K/uL   RBC 4.06 3.87 - 5.11 MIL/uL   Hemoglobin 11.2 (L) 12.0 - 15.0 g/dL   HCT 36.9 36.0 - 46.0 %   MCV 90.9 80.0 - 100.0 fL   MCH 27.6 26.0 - 34.0 pg   MCHC 30.4 30.0 - 36.0 g/dL   RDW 17.5 (H) 11.5 -  15.5 %   Platelets 122 (L) 150 - 400 K/uL   nRBC 0.0 0.0 - 0.2 %    Comment: Performed at [facility], [address] [place], [place] [address]  Lactic acid, plasma     Status: Abnormal   Collection Time: 12/18/21  6:28 AM  Result Value Ref Range   Lactic Acid, Venous 2.8 (HH) 0.5 - 1.9 mmol/L    Comment: CRITICAL RESULT CALLED TO, READ BACK BY AND VERIFIED WITH [place] [facility] AT 0720 12/18/21 MJU/AT Performed at [facility], [address] [place], [place] [address]   Legionella Pneumophila Serogp 1 Ur Ag     Status: None   Collection Time: 12/18/21  6:28 AM  Result Value Ref Range   L. pneumophila Serogp 1 Ur Ag Negative Negative    Comment: (NOTE) Presumptive negative for L. pneumophila serogroup 1 antigen in urine, suggesting no recent or current infection. Legionnaires' disease cannot be ruled out since other serogroups and species may also cause disease. Performed At: [facility] [place], [place] [id number] Rush Farmer MD [id number]    Source of Sample  URINE, RANDOM     Comment: Performed at [facility], [address] [place], [place] [address]  Strep pneumoniae urinary antigen     Status: None   Collection Time: 12/18/21  6:28 AM  Result Value Ref Range   Strep Pneumo Urinary Antigen NEGATIVE NEGATIVE    Comment:        Infection due to S. pneumoniae cannot be absolutely ruled out since the antigen present may be below the detection limit of the test. Performed at [facility], [address] [place],  [address]   Procalcitonin     Status: None   Collection Time: 12/18/21  3:46 PM  Result Value Ref Range   Procalcitonin 1.43 ng/mL    Comment:        Interpretation: PCT > 0.5 ng/mL and <= 2 ng/mL: Systemic infection (sepsis) is possible, but other conditions are known to elevate PCT as well. (NOTE)       Sepsis PCT Algorithm           Lower Respiratory Tract                                      Infection PCT Algorithm    ----------------------------     ----------------------------         PCT < 0.25 ng/mL                PCT < 0.10 ng/mL          Strongly encourage             Strongly discourage   discontinuation of antibiotics    initiation of antibiotics    ----------------------------     -----------------------------       PCT 0.25 - 0.50 ng/mL            PCT 0.10 - 0.25 ng/mL               OR       >80% decrease in PCT            Discourage initiation of  antibiotics      Encourage discontinuation           of antibiotics    ----------------------------     -----------------------------         PCT >= 0.50 ng/mL              PCT 0.26 - 0.50 ng/mL                AND       <80% decrease in PCT             Encourage initiation of                                             antibiotics       Encourage continuation           of antibiotics    ----------------------------     -----------------------------        PCT >= 0.50 ng/mL                  PCT  > 0.50 ng/mL               AND         increase in PCT                  Strongly encourage                                      initiation of antibiotics    Strongly encourage escalation           of antibiotics                                     -----------------------------                                           PCT <= 0.25 ng/mL                                                 OR                                        > 80% decrease in PCT                                      Discontinue / Do not initiate                                             antibiotics  Performed at [facility], [address] [place], [place] [address]   Lactic acid, plasma     Status: None   Collection  Time: 12/18/21  3:46 PM  Result Value Ref Range   Lactic Acid, Venous 1.8 0.5 - 1.9 mmol/L    Comment: Performed at [facility], [address] [place], [place] [address]  Procalcitonin     Status: None   Collection Time: 12/19/21  5:42 AM  Result Value Ref Range   Procalcitonin 2.30 ng/mL    Comment:        Interpretation: PCT > 2 ng/mL: Systemic infection (sepsis) is likely, unless other causes are known. (NOTE)       Sepsis PCT Algorithm           Lower Respiratory Tract                                      Infection PCT Algorithm    ----------------------------     ----------------------------         PCT < 0.25 ng/mL                PCT < 0.10 ng/mL          Strongly encourage             Strongly discourage   discontinuation of antibiotics    initiation of antibiotics    ----------------------------     -----------------------------       PCT 0.25 - 0.50 ng/mL            PCT 0.10 - 0.25 ng/mL               OR       >80% decrease in PCT            Discourage initiation of                                            antibiotics      Encourage discontinuation           of antibiotics    ----------------------------     -----------------------------         PCT >=  0.50 ng/mL              PCT 0.26 - 0.50 ng/mL               AND       <80% decrease in PCT              Encourage initiation of                                             antibiotics       Encourage continuation           of antibiotics    ----------------------------     -----------------------------        PCT >= 0.50 ng/mL                  PCT > 0.50 ng/mL               AND         increase in PCT                  Strongly encourage  initiation of antibiotics    Strongly encourage escalation           of antibiotics                                     -----------------------------                                           PCT <= 0.25 ng/mL                                                 OR                                        > 80% decrease in PCT                                      Discontinue / Do not initiate                                             antibiotics  Performed at [facility], [address] [place], [place] [address]   Basic metabolic panel     Status: Abnormal   Collection Time: 12/19/21  5:42 AM  Result Value Ref Range   Sodium 135 135 - 145 mmol/L   Potassium 4.0 3.5 - 5.1 mmol/L   Chloride 106 98 - 111 mmol/L   CO2 21 (L) 22 - 32 mmol/L   Glucose, Bld 106 (H) 70 - 99 mg/dL    Comment: Glucose reference range applies only to samples taken after fasting for at least 8 hours.   BUN 32 (H) 8 - 23 mg/dL   Creatinine, Ser 1.18 (H) 0.44 - 1.00 mg/dL   Calcium 8.6 (L) 8.9 - 10.3 mg/dL   GFR, Estimated 44 (L) >60 mL/min    Comment: (NOTE) Calculated using the CKD-EPI Creatinine Equation (2021)    Anion gap 8 5 - 15    Comment: Performed at [facility], [address] [place], [place] [address]  CBC     Status: Abnormal   Collection Time: 12/19/21  5:42 AM  Result Value Ref Range   WBC 7.8 4.0 - 10.5 K/uL   RBC 3.48 (L) 3.87 - 5.11 MIL/uL   Hemoglobin 9.5 (L) 12.0 - 15.0 g/dL   HCT 30.7 (L)  36.0 - 46.0 %   MCV 88.2 80.0 - 100.0 fL   MCH 27.3 26.0 - 34.0 pg   MCHC 30.9 30.0 - 36.0 g/dL   RDW 17.7 (H) 11.5 - 15.5 %   Platelets 120 (L) 150 - 400 K/uL   nRBC 0.3 (H) 0.0 - 0.2 %    Comment: Performed at [facility], [address], [place], [place] [address]  Brain natriuretic peptide     Status: Abnormal   Collection Time: 12/19/21  5:42 AM  Result  Value Ref Range   B Natriuretic Peptide 2,011.3 (H) 0.0 - 100.0 pg/mL    Comment: Performed at [facility], [address] [place], [place] [address]  Joella Prince Session Summary     Status: None   Collection Time: 12/21/21 11:17 AM  Result Value Ref Range   Course ID [email or web address]    Course Intent Curative    Course Start Date 12/01/2021  2:38 PM    Session Number 9    Course First Treatment Date 12/08/2021  1:19 PM    Course Last Treatment Date 12/21/2021 11:16 AM    Course Elapsed Days 13    Reference Point ID Lung UHRT DP    Reference Point Dosage Given to Date 54.00000009 Gy   Reference Point Session Dosage Given 1.66063016 Gy   Plan ID [email or web address]    Plan Fractions Treated to Date 9    Plan Total Fractions Prescribed 10    Plan Prescribed Dose Per Fraction 6 Gy   Plan Total Prescribed Dose 60.000000 Gy   Plan Primary Reference Point Lung [place] DP   Rad Onc [place] Session Summary     Status: None   Collection Time: 12/22/21  1:37 PM  Result Value Ref Range   Course ID [email or web address]    Course Intent Curative    Course Start Date 12/01/2021  2:38 PM    Session Number 10    Course First Treatment Date 12/08/2021  1:19 PM    Course Last Treatment Date 12/22/2021  1:35 PM    Course Elapsed Days 14    Reference Point ID Lung UHRT DP    Reference Point Dosage Given to Date 60.0000001 Gy   Reference Point Session Dosage Given [id number] Gy   Plan ID [email or web address]    Plan Fractions Treated to Date 10    Plan Total Fractions Prescribed 10    Plan Prescribed Dose Per Fraction 6 Gy   Plan Total Prescribed Dose  60.000000 Gy   Plan Primary Reference Point Lung UHRT DP   Lactate dehydrogenase     Status: None   Collection Time: 01/01/22  2:34 PM  Result Value Ref Range   LDH 173 98 - 192 U/L    Comment: Performed at [facility], [address] [place], [place] [address]  Comprehensive metabolic panel     Status: Abnormal   Collection Time: 01/01/22  2:34 PM  Result Value Ref Range   Sodium 135 135 - 145 mmol/L   Potassium 3.5 3.5 - 5.1 mmol/L   Chloride 102 98 - 111 mmol/L   CO2 23 22 - 32 mmol/L   Glucose, Bld 116 (H) 70 - 99 mg/dL    Comment: Glucose reference range applies only to samples taken after fasting for at least 8 hours.   BUN 21 8 - 23 mg/dL   Creatinine, Ser 1.18 (H) 0.44 - 1.00 mg/dL   Calcium 9.0 8.9 - 10.3 mg/dL   Total Protein 7.4 6.5 - 8.1 g/dL   Albumin 3.2 (L) 3.5 - 5.0 g/dL   AST 26 15 - 41 U/L   ALT 14 0 - 44 U/L   Alkaline Phosphatase 76 38 - 126 U/L   Total Bilirubin 0.5 0.3 - 1.2 mg/dL   GFR, Estimated 44 (L) >60 mL/min    Comment: (NOTE) Calculated using the CKD-EPI Creatinine Equation (2021)    Anion gap 10 5 - 15    Comment: Performed at [facility], [address] [place],  [place] [address]  CBC with Differential/Platelet     Status: Abnormal   Collection Time: 01/01/22  2:34 PM  Result Value Ref Range   WBC 3.4 (L) 4.0 - 10.5 K/uL   RBC 3.89 3.87 - 5.11 MIL/uL   Hemoglobin 10.8 (L) 12.0 - 15.0 g/dL   HCT 35.1 (L) 36.0 - 46.0 %   MCV 90.2 80.0 - 100.0 fL   MCH 27.8 26.0 - 34.0 pg   MCHC 30.8 30.0 - 36.0 g/dL   RDW 18.5 (H) 11.5 - 15.5 %   Platelets 157 150 - 400 K/uL   nRBC 0.0 0.0 - 0.2 %   Neutrophils Relative % 68 %   Neutro Abs 2.4 1.7 - 7.7 K/uL   Lymphocytes Relative 16 %   Lymphs Abs 0.5 (L) 0.7 - 4.0 K/uL   Monocytes Relative 9 %   Monocytes Absolute 0.3 0.1 - 1.0 K/uL   Eosinophils Relative 5 %   Eosinophils Absolute 0.2 0.0 - 0.5 K/uL   Basophils Relative 2 %   Basophils Absolute 0.1 0.0 - 0.1 K/uL   Immature  Granulocytes 0 %   Abs Immature Granulocytes 0.00 0.00 - 0.07 K/uL    Comment: Performed at [facility], [address], [place], [place] [address]    Radiology DG Ankle Complete Left  Result Date: 12/17/2021 CLINICAL DATA:  Left ankle wound EXAM: LEFT ANKLE COMPLETE - 3+ VIEW COMPARISON:  None. FINDINGS: Normal alignment. No acute fracture or dislocation. No osseous erosion. No ankle effusion. Vascular calcifications are noted. There is diffuse soft tissue swelling of the visualized left lower extremity with resultant mild bimalleolar soft tissue swelling. Dressing material overlies the lateral malleolus. IMPRESSION: Diffuse soft tissue swelling.  No acute fracture or dislocation. Electronically Signed   By: Fidela Salisbury M.D.   On: 12/17/2021 21:46   CT HEAD WO CONTRAST (5MM)  Result Date: 12/18/2021 CLINICAL DATA:  Mental status change, unknown cause EXAM: CT HEAD WITHOUT CONTRAST TECHNIQUE: Contiguous axial images were obtained from the base of the skull through the vertex without intravenous contrast. RADIATION DOSE REDUCTION: This exam was performed according to the departmental dose-optimization program which includes automated exposure control, adjustment of the mA and/or kV according to patient size and/or use of iterative reconstruction technique. COMPARISON:  11/25/2021. FINDINGS: Brain: No evidence of acute infarction, hemorrhage, cerebral edema, mass, mass effect, or midline shift. No hydrocephalus or extra-axial fluid collection. Normal gray-white differentiation. Vascular: No hyperdense vessel. Atherosclerotic calcifications in the intracranial carotid and vertebral arteries. Skull: Hyperostosis frontalis. Negative for fracture or focal lesion. Sinuses/Orbits: Mucosal thickening in the ethmoid air cells. Status post bilateral lens replacements. Other: Trace fluid in left mastoid tip. IMPRESSION: IMPRESSION No acute intracranial process. No etiology seen for the patient's altered  mental status Electronically Signed   By: Merilyn Baba M.D.   On: 12/18/2021 01:44   CT Angio Chest Pulmonary Embolism (PE) W or WO Contrast  Result Date: 12/18/2021 CLINICAL DATA:  Code sepsis short of breath EXAM: CT ANGIOGRAPHY CHEST WITH CONTRAST TECHNIQUE: Multidetector CT imaging of the chest was performed using the standard protocol during bolus administration of intravenous contrast. Multiplanar CT image reconstructions and MIPs were obtained to evaluate the vascular anatomy. RADIATION DOSE REDUCTION: This exam was performed according to the departmental dose-optimization program which includes automated exposure control, adjustment of the mA and/or kV according to patient size and/or use of iterative reconstruction technique. CONTRAST:  147mL OMNIPAQUE IOHEXOL 350 MG/ML SOLN COMPARISON:  Chest x-ray 12/17/2021,  PET CT 11/26/2021, CT chest 11/25/2021, 11/05/2021 FINDINGS: Cardiovascular: Satisfactory opacification of the pulmonary arteries to the segmental level. No evidence of pulmonary embolism. Dilated pulmonary trunk and main pulmonary arteries suspicious for arterial hypertension. Ascending aortic diameter measures up to 4.3 cm, no significant change. Cardiomegaly with coronary vascular calcification. No pericardial effusion. Partially visualized intracardiac pacing leads. Moderate aortic atherosclerosis. Mediastinum/Nodes: Midline trachea. No suspicious thyroid mass. Slight increased size of AP window lymph node measuring 14 mm, previously 10 mm. Multiple right hilar lymph nodes also slightly increased. Esophagus within normal limits. Lungs/Pleura: Emphysema. Excessive respiratory motion artifact. No pleural effusion or pneumothorax. Right upper lobe lung mass is less confluent as compared with previous exam. A solid nodular area within the posterior right upper lobe measures 2.3 x 2.2 cm and may reflect a separate or new pulmonary nodule. Ill-defined consolidation and ground-glass density in the  right upper lobe in the region of the previously noted lung mass. Additional mild ground-glass densities in the bilateral lower lobes with interstitial thickening noted. Increased size of left upper lobe pulmonary nodule, series 6, image 43, measuring 9 mm previously 6 mm. Other pulmonary nodules are difficult to visualize due to significant respiratory motion artifact. Upper Abdomen: No acute abnormality. Musculoskeletal: No chest wall abnormality. No acute or significant osseous findings. Review of the MIP images confirms the above findings. IMPRESSION: 1. Negative for acute pulmonary embolus. 2. Significant respiratory motion degradation. 3. The previously noted right upper lobe lung mass appears less confluent on today's examination without discretely measurable mass. A nodule in the posterior right upper lobe may be new or less likely residual of the previously noted confluent right upper lobe lung mass. There is slight increased size of a previously noted left upper lobe pulmonary nodule, suggesting potential metastatic focus. 4. Emphysema. Diffuse ground-glass density in the right upper lobe with additional interstitial and patchy ground-glass densities in the bilateral lower lobes suspicious for superimposed pneumonia/infection. Aortic Atherosclerosis (ICD10-I70.0) and Emphysema (ICD10-J43.9). Electronically Signed   By: Donavan Foil M.D.   On: 12/18/2021 02:02   DG Chest Port 1 View  Result Date: 12/18/2021 CLINICAL DATA:  Acute respiratory failure with hypoxia. Increased weakness/lethargy. Known lung cancer, CHF, atrial fibrillation. EXAM: PORTABLE CHEST 1 VIEW COMPARISON:  Chest x-rays dated 12/17/2021 and 11/05/2021. FINDINGS: Stable cardiomegaly. LEFT chest wall pacemaker/ICD apparatus appears stable in position. Coarse lung markings are again seen bilaterally. Subtle ground-glass opacities within the RIGHT upper lobe, better demonstrated on the chest CT angiogram performed earlier same day.  Bilateral upper lobe nodules are also better demonstrated on the chest CT angiogram. No pleural effusion or pneumothorax is seen. Bilateral shoulder arthroplasty hardware in place. Osseous structures about the chest are otherwise unremarkable. IMPRESSION: 1. Stable cardiomegaly. 2. Subtle ground-glass opacities within the RIGHT upper lobe, better demonstrated on the chest CT angiogram performed earlier same day, suspicious for pneumonia superimposed on chronic interstitial lung disease. Electronically Signed   By: Franki Cabot M.D.   On: 12/18/2021 08:34   DG Chest Portable 1 View  Result Date: 12/17/2021 CLINICAL DATA:  Dyspnea EXAM: PORTABLE CHEST 1 VIEW COMPARISON:  11/05/2021 FINDINGS: Stable pulmonary insufflation. The rounded right suprahilar mass is not visualized on the current examination suggesting interval response of therapy though this is not optimally assessed by this modality. q multifocal pulmonary infiltrate has developed within the mid lung zones bilaterally, possibly representing multifocal pneumonic infiltrate in the appropriate clinical setting. No pneumothorax or pleural effusion. Cardiac size is mildly enlarged, unchanged. Left subclavian  dual lead pacemaker is again noted. Pulmonary vascularity is normal. Bilateral total shoulder arthroplasty has been performed. IMPRESSION: Multifocal pulmonary infiltrate, possibly infectious or inflammatory in the acute setting. Right suprahilar mass not as well visualized on current examination suggesting interval response of therapy. This would be better assessed with dedicated CT imaging. Stable cardiomegaly Electronically Signed   By: Fidela Salisbury M.D.   On: 12/17/2021 21:44    Assessment/Plan  Atherosclerosis of native arteries of the extremities with ulceration ([place]) Ulcer has now basically healed.  Duplex showed no obvious focal stenosis in the lower extremities with diffuse atherosclerotic changes.  ABIs were 1.10 on the right and 1.25 on  the left with digital indices of 1.77 bilaterally and strong waveforms.  Continue current medical regimen.  Recheck in 6 months.  Hypertension blood pressure control important in reducing the progression of atherosclerotic disease. On appropriate oral medications.   Malignant neoplasm of right upper lobe of lung ([place]) Follows with oncology  Chronic kidney disease (CKD), stage III (moderate) (H[facility]) Contributes lower extremity swelling.  Avoid contrast unless absolutely necessary.  Hyperlipidemia lipid control important in reducing the progression of atherosclerotic disease. Continue statin therapy    Leotis Pain, MD  01/15/2022 11:05 AM    This note was created with Dragon medical transcription system.  Any errors from dictation are purely unintentional

## 2022-01-15 NOTE — Assessment & Plan Note (Signed)
blood pressure control important in reducing the progression of atherosclerotic disease. On appropriate oral medications.  

## 2022-01-15 NOTE — Assessment & Plan Note (Signed)
lipid control important in reducing the progression of atherosclerotic disease. Continue statin therapy  

## 2022-01-15 NOTE — Assessment & Plan Note (Signed)
Ulcer has now basically healed.  Duplex showed no obvious focal stenosis in the lower extremities with diffuse atherosclerotic changes.  ABIs were 1.10 on the right and 1.25 on the left with digital indices of 1.77 bilaterally and strong waveforms.  Continue current medical regimen.  Recheck in 6 months.

## 2022-01-16 ENCOUNTER — Inpatient Hospital Stay
Admission: EM | Admit: 2022-01-16 | Discharge: 2022-01-31 | DRG: 207 | Disposition: A | Discharge status: Hospice | Payer: Medicare HMO | Attending: Family Medicine | Admitting: Family Medicine

## 2022-01-16 ENCOUNTER — Emergency Department: Payer: Medicare HMO

## 2022-01-16 ENCOUNTER — Encounter: Payer: Self-pay | Admitting: Emergency Medicine

## 2022-01-16 DIAGNOSIS — E785 Hyperlipidemia, unspecified: Secondary | ICD-10-CM | POA: Diagnosis present

## 2022-01-16 DIAGNOSIS — I428 Other cardiomyopathies: Secondary | ICD-10-CM | POA: Diagnosis not present

## 2022-01-16 DIAGNOSIS — M109 Gout, unspecified: Secondary | ICD-10-CM | POA: Diagnosis present

## 2022-01-16 DIAGNOSIS — I5033 Acute on chronic diastolic (congestive) heart failure: Secondary | ICD-10-CM | POA: Diagnosis present

## 2022-01-16 DIAGNOSIS — E860 Dehydration: Secondary | ICD-10-CM | POA: Diagnosis present

## 2022-01-16 DIAGNOSIS — L8990 Pressure ulcer of unspecified site, unspecified stage: Secondary | ICD-10-CM | POA: Insufficient documentation

## 2022-01-16 DIAGNOSIS — Z853 Personal history of malignant neoplasm of breast: Secondary | ICD-10-CM

## 2022-01-16 DIAGNOSIS — Z79891 Long term (current) use of opiate analgesic: Secondary | ICD-10-CM | POA: Diagnosis not present

## 2022-01-16 DIAGNOSIS — M961 Postlaminectomy syndrome, not elsewhere classified: Secondary | ICD-10-CM | POA: Diagnosis present

## 2022-01-16 DIAGNOSIS — K219 Gastro-esophageal reflux disease without esophagitis: Secondary | ICD-10-CM | POA: Diagnosis present

## 2022-01-16 DIAGNOSIS — Z7901 Long term (current) use of anticoagulants: Secondary | ICD-10-CM | POA: Diagnosis not present

## 2022-01-16 DIAGNOSIS — J449 Chronic obstructive pulmonary disease, unspecified: Secondary | ICD-10-CM

## 2022-01-16 DIAGNOSIS — I509 Heart failure, unspecified: Secondary | ICD-10-CM | POA: Diagnosis not present

## 2022-01-16 DIAGNOSIS — N390 Urinary tract infection, site not specified: Secondary | ICD-10-CM | POA: Diagnosis present

## 2022-01-16 DIAGNOSIS — Z9981 Dependence on supplemental oxygen: Secondary | ICD-10-CM

## 2022-01-16 DIAGNOSIS — J849 Interstitial pulmonary disease, unspecified: Secondary | ICD-10-CM | POA: Diagnosis not present

## 2022-01-16 DIAGNOSIS — Z823 Family history of stroke: Secondary | ICD-10-CM

## 2022-01-16 DIAGNOSIS — Z8249 Family history of ischemic heart disease and other diseases of the circulatory system: Secondary | ICD-10-CM

## 2022-01-16 DIAGNOSIS — E440 Moderate protein-calorie malnutrition: Secondary | ICD-10-CM | POA: Diagnosis present

## 2022-01-16 DIAGNOSIS — B952 Enterococcus as the cause of diseases classified elsewhere: Secondary | ICD-10-CM | POA: Diagnosis present

## 2022-01-16 DIAGNOSIS — J439 Emphysema, unspecified: Secondary | ICD-10-CM | POA: Diagnosis not present

## 2022-01-16 DIAGNOSIS — N1832 Chronic kidney disease, stage 3b: Secondary | ICD-10-CM | POA: Diagnosis present

## 2022-01-16 DIAGNOSIS — Z8616 Personal history of COVID-19: Secondary | ICD-10-CM | POA: Diagnosis not present

## 2022-01-16 DIAGNOSIS — G2581 Restless legs syndrome: Secondary | ICD-10-CM | POA: Diagnosis present

## 2022-01-16 DIAGNOSIS — R627 Adult failure to thrive: Secondary | ICD-10-CM | POA: Diagnosis present

## 2022-01-16 DIAGNOSIS — I480 Paroxysmal atrial fibrillation: Secondary | ICD-10-CM | POA: Diagnosis present

## 2022-01-16 DIAGNOSIS — J9600 Acute respiratory failure, unspecified whether with hypoxia or hypercapnia: Secondary | ICD-10-CM | POA: Diagnosis present

## 2022-01-16 DIAGNOSIS — Z950 Presence of cardiac pacemaker: Secondary | ICD-10-CM

## 2022-01-16 DIAGNOSIS — R64 Cachexia: Secondary | ICD-10-CM | POA: Diagnosis present

## 2022-01-16 DIAGNOSIS — I130 Hypertensive heart and chronic kidney disease with heart failure and stage 1 through stage 4 chronic kidney disease, or unspecified chronic kidney disease: Secondary | ICD-10-CM | POA: Diagnosis present

## 2022-01-16 DIAGNOSIS — R0902 Hypoxemia: Secondary | ICD-10-CM | POA: Diagnosis not present

## 2022-01-16 DIAGNOSIS — Z85118 Personal history of other malignant neoplasm of bronchus and lung: Secondary | ICD-10-CM | POA: Diagnosis not present

## 2022-01-16 DIAGNOSIS — Z96612 Presence of left artificial shoulder joint: Secondary | ICD-10-CM | POA: Diagnosis present

## 2022-01-16 DIAGNOSIS — J9621 Acute and chronic respiratory failure with hypoxia: Secondary | ICD-10-CM | POA: Diagnosis present

## 2022-01-16 DIAGNOSIS — J189 Pneumonia, unspecified organism: Secondary | ICD-10-CM

## 2022-01-16 DIAGNOSIS — I252 Old myocardial infarction: Secondary | ICD-10-CM

## 2022-01-16 DIAGNOSIS — Z79899 Other long term (current) drug therapy: Secondary | ICD-10-CM

## 2022-01-16 DIAGNOSIS — C3411 Malignant neoplasm of upper lobe, right bronchus or lung: Secondary | ICD-10-CM | POA: Diagnosis present

## 2022-01-16 DIAGNOSIS — J432 Centrilobular emphysema: Secondary | ICD-10-CM | POA: Diagnosis present

## 2022-01-16 DIAGNOSIS — J690 Pneumonitis due to inhalation of food and vomit: Secondary | ICD-10-CM | POA: Diagnosis present

## 2022-01-16 DIAGNOSIS — J90 Pleural effusion, not elsewhere classified: Secondary | ICD-10-CM | POA: Diagnosis not present

## 2022-01-16 DIAGNOSIS — Z66 Do not resuscitate: Secondary | ICD-10-CM | POA: Diagnosis present

## 2022-01-16 DIAGNOSIS — C3490 Malignant neoplasm of unspecified part of unspecified bronchus or lung: Secondary | ICD-10-CM | POA: Diagnosis not present

## 2022-01-16 DIAGNOSIS — Z87891 Personal history of nicotine dependence: Secondary | ICD-10-CM | POA: Diagnosis not present

## 2022-01-16 DIAGNOSIS — Z923 Personal history of irradiation: Secondary | ICD-10-CM

## 2022-01-16 DIAGNOSIS — I959 Hypotension, unspecified: Secondary | ICD-10-CM | POA: Diagnosis not present

## 2022-01-16 DIAGNOSIS — I248 Other forms of acute ischemic heart disease: Secondary | ICD-10-CM | POA: Diagnosis present

## 2022-01-16 DIAGNOSIS — Z96611 Presence of right artificial shoulder joint: Secondary | ICD-10-CM | POA: Diagnosis present

## 2022-01-16 DIAGNOSIS — J9602 Acute respiratory failure with hypercapnia: Secondary | ICD-10-CM | POA: Diagnosis not present

## 2022-01-16 DIAGNOSIS — J9622 Acute and chronic respiratory failure with hypercapnia: Secondary | ICD-10-CM | POA: Diagnosis present

## 2022-01-16 DIAGNOSIS — R918 Other nonspecific abnormal finding of lung field: Secondary | ICD-10-CM | POA: Diagnosis not present

## 2022-01-16 DIAGNOSIS — R0602 Shortness of breath: Secondary | ICD-10-CM | POA: Diagnosis not present

## 2022-01-16 DIAGNOSIS — Z4682 Encounter for fitting and adjustment of non-vascular catheter: Secondary | ICD-10-CM | POA: Diagnosis not present

## 2022-01-16 DIAGNOSIS — Z515 Encounter for palliative care: Secondary | ICD-10-CM

## 2022-01-16 DIAGNOSIS — N179 Acute kidney failure, unspecified: Secondary | ICD-10-CM | POA: Diagnosis present

## 2022-01-16 DIAGNOSIS — I517 Cardiomegaly: Secondary | ICD-10-CM | POA: Diagnosis not present

## 2022-01-16 DIAGNOSIS — N1831 Chronic kidney disease, stage 3a: Secondary | ICD-10-CM

## 2022-01-16 DIAGNOSIS — Z803 Family history of malignant neoplasm of breast: Secondary | ICD-10-CM

## 2022-01-16 DIAGNOSIS — R1310 Dysphagia, unspecified: Secondary | ICD-10-CM | POA: Diagnosis present

## 2022-01-16 DIAGNOSIS — J188 Other pneumonia, unspecified organism: Secondary | ICD-10-CM | POA: Diagnosis not present

## 2022-01-16 DIAGNOSIS — G9341 Metabolic encephalopathy: Secondary | ICD-10-CM | POA: Diagnosis present

## 2022-01-16 DIAGNOSIS — R531 Weakness: Principal | ICD-10-CM

## 2022-01-16 DIAGNOSIS — J9601 Acute respiratory failure with hypoxia: Secondary | ICD-10-CM | POA: Diagnosis not present

## 2022-01-16 DIAGNOSIS — R4182 Altered mental status, unspecified: Secondary | ICD-10-CM | POA: Diagnosis not present

## 2022-01-16 DIAGNOSIS — I5032 Chronic diastolic (congestive) heart failure: Secondary | ICD-10-CM | POA: Diagnosis present

## 2022-01-16 DIAGNOSIS — Z7984 Long term (current) use of oral hypoglycemic drugs: Secondary | ICD-10-CM

## 2022-01-16 DIAGNOSIS — J9690 Respiratory failure, unspecified, unspecified whether with hypoxia or hypercapnia: Secondary | ICD-10-CM | POA: Diagnosis not present

## 2022-01-16 DIAGNOSIS — Z7951 Long term (current) use of inhaled steroids: Secondary | ICD-10-CM

## 2022-01-16 DIAGNOSIS — Z7189 Other specified counseling: Secondary | ICD-10-CM | POA: Diagnosis not present

## 2022-01-16 DIAGNOSIS — R569 Unspecified convulsions: Secondary | ICD-10-CM | POA: Diagnosis not present

## 2022-01-16 DIAGNOSIS — Z90710 Acquired absence of both cervix and uterus: Secondary | ICD-10-CM

## 2022-01-16 DIAGNOSIS — Z7982 Long term (current) use of aspirin: Secondary | ICD-10-CM

## 2022-01-16 DIAGNOSIS — R5383 Other fatigue: Secondary | ICD-10-CM | POA: Diagnosis not present

## 2022-01-16 DIAGNOSIS — Z86718 Personal history of other venous thrombosis and embolism: Secondary | ICD-10-CM | POA: Diagnosis not present

## 2022-01-16 DIAGNOSIS — Z6823 Body mass index (BMI) 23.0-23.9, adult: Secondary | ICD-10-CM

## 2022-01-16 DIAGNOSIS — N3281 Overactive bladder: Secondary | ICD-10-CM | POA: Diagnosis present

## 2022-01-16 LAB — CBC WITH DIFFERENTIAL/PLATELET
Abs Immature Granulocytes: 0.03 10*3/uL (ref 0.00–0.07)
Basophils Absolute: 0.1 10*3/uL (ref 0.0–0.1)
Basophils Relative: 1 %
Eosinophils Absolute: 0.3 10*3/uL (ref 0.0–0.5)
Eosinophils Relative: 8 %
HCT: 40.5 % (ref 36.0–46.0)
Hemoglobin: 12 g/dL (ref 12.0–15.0)
Immature Granulocytes: 1 %
Lymphocytes Relative: 14 %
Lymphs Abs: 0.6 10*3/uL — ABNORMAL LOW (ref 0.7–4.0)
MCH: 26.8 pg (ref 26.0–34.0)
MCHC: 29.6 g/dL — ABNORMAL LOW (ref 30.0–36.0)
MCV: 90.4 fL (ref 80.0–100.0)
Monocytes Absolute: 0.4 10*3/uL (ref 0.1–1.0)
Monocytes Relative: 8 %
Neutro Abs: 3 10*3/uL (ref 1.7–7.7)
Neutrophils Relative %: 68 %
Platelets: 180 10*3/uL (ref 150–400)
RBC: 4.48 MIL/uL (ref 3.87–5.11)
RDW: 18.4 % — ABNORMAL HIGH (ref 11.5–15.5)
WBC: 4.4 10*3/uL (ref 4.0–10.5)
nRBC: 0 % (ref 0.0–0.2)

## 2022-01-16 MED ORDER — SODIUM CHLORIDE 0.9 % IV BOLUS
500.0000 mL | Freq: Once | INTRAVENOUS | Status: AC
  Administered 2022-01-16: 500 mL via INTRAVENOUS

## 2022-01-16 NOTE — ED Provider Notes (Signed)
[facility] Provider Note    Event Date/Time   First MD Initiated Contact with Patient 01/16/22 2307     (approximate)   History   Weakness   HPI  Level V caveat: Limited by somnolence  Kristin [name] is a 88 y.o. female brought to the ED via EMS from home with a chief complaint of generalized weakness.  Family called to say that patient was sleeping more than usual.  History of lung cancer on 4 L nasal cannula oxygen at baseline.  EMS reports patient was able to walk to the stretcher.  Complains of cough.  Denies chest pain, abdominal pain, nausea, vomiting or dizziness.     Past Medical History   Past Medical History:  Diagnosis Date   Acute postoperative pain 02/09/2018   Arrhythmia, sinus node 04/03/2014   Arthritis    Arthritis    Atrial fibrillation (HCC)    Back pain    lower back chronic   Bradycardia    Breast cancer ([place]) 2004   right breast cancer   Cancer ([place]) 2004   rt breast cancer-post lumpectomy- chemo/rad   CHF (congestive heart failure) (HCC)    Chronic back pain    CKD (chronic kidney disease)    COPD (chronic obstructive pulmonary disease) ([place])    wears O2 at 2L via [place] at night   COVID-19 06/11/2020   Cystocele    Decubitus ulcers    Dehydration    Gout    Hematuria    Hepatitis C    Hyperlipidemia    Hypertension    Hypomagnesemia 06/25/2015   NSTEMI (non-ST elevated myocardial infarction) ([place]) 04/08/2021   OAB (overactive bladder)    Overactive bladder    Overdose opiate, accidental or unintentional, sequela 10/02/2020   Restless leg    Shoulder pain, bilateral    Urinary frequency    UTI (lower urinary tract infection)    Vaginal atrophy      Active Problem List   Patient Active Problem List   Diagnosis Date Noted   Acute metabolic encephalopathy    Sepsis ([place]) 12/18/2021   Palliative care encounter    Hypertension 12/17/2021   AMS (altered mental status) 12/17/2021   Malignant neoplasm of right  upper lobe of lung ([place]) 12/02/2021   Small cell lung cancer ([place]) 11/17/2021   Goals of care, counseling/discussion 11/17/2021   Overweight (BMI 25.0-29.9) 11/11/2021   Bacteremia 11/06/2021   Multifocal pneumonia 11/05/2021   Mass of upper lobe of right lung 11/05/2021   Sepsis due to Escherichia coli (E. coli) ([place]) 11/05/2021   Dyslipidemia 11/05/2021   COPD (chronic obstructive pulmonary disease) ([place]) 09/20/2021   Lymphedema 09/17/2021   Ischemic leg pain (Bilateral) 08/19/2021   Paroxysmal atrial fibrillation (HCC)    History of atrial fibrillation 04/08/2021   PVD (peripheral vascular disease) ([place]) 04/08/2021   Low blood pressure reading 04/07/2021   Physical tolerance to opiate drug 01/28/2021   Chronic use of opiate for therapeutic purpose 11/24/2020   Atherosclerosis of native arteries of the extremities with ulceration ([place]) 96/29/5284   Uncomplicated opioid dependence ([place]) 10/20/2020   Encounter for chronic pain management 10/02/2020   Encounter for pain management counseling 10/02/2020   Chronic kidney disease 10/02/2020   Chronic sacroiliac pain (Bilateral) 09/30/2020   Osteoarthritis of sacroiliac joints (Bilateral) ([place]) 09/30/2020   Chronic midline low back pain without sciatica 09/30/2020   Acute on chronic respiratory failure with hypoxia ([place]) 08/02/2020   Prolonged QT interval  08/02/2020   Venous insufficiency of both lower extremities 07/04/2020   Varicose veins of left lower extremity with inflammation, with ulcer of ankle limited to breakdown of skin ([place]) 04/01/2020   Opioid dependence with opioid-induced disorder ([place]) 02/14/2020   Abnormal MRI, lumbar spine (11/03/2019) 01/23/2020   Pharmacologic therapy 12/20/2019   Other specified dorsopathies, sacral and sacrococcygeal region 06/12/2019   Chronic hip pain (Left) 06/12/2019   Osteoarthritis of hip (Left) 06/12/2019   Greater trochanteric bursitis of hip (Left) 03/12/2019   Preop testing 01/22/2019    Cervicalgia 10/03/2018   Chronic shoulders pain (Bilateral) (L>R) 05/29/2018   Osteoarthritis of shoulder (Bilateral) 05/29/2018   Osteoarthritis of hip (Bilateral) 05/29/2018   Chronic shoulder pain after shoulder replacement (Bilateral) (L>R) 05/29/2018   Trigger finger, left middle finger 05/29/2018   Spondylosis without myelopathy or radiculopathy, lumbosacral region 02/09/2018   Insomnia 02/01/2018   Numbness and tingling of upper extremity (Bilateral) 09/07/2017   Chronic shoulder pain, Radicular (Bilateral) (L>R) 09/07/2017   Cervical facet syndrome (Bilateral) (L>R) 09/07/2017   Cervical (3-4 mm) Anterolisthesis of C4 on C5 09/07/2017   Cervical foraminal stenosis (Bilateral) 09/07/2017   Acute renal failure superimposed on stage 3a chronic kidney disease ([place]) 08/14/2017   Chronic lower extremity pain (2ry area of Pain) (Bilateral) (L>R) 05/13/2017   DDD (degenerative disc disease), cervical 05/13/2017   Chronic cervical radicular pain (3ry area of Pain) (Bilateral) (L>R) 05/04/2017   Chronic anticoagulation ([place]) 05/04/2017   Chronic hip pain (Bilateral) 04/20/2017   Chronic neck pain 03/01/2017   Hyperlipidemia 02/02/2017   Red blood cell antibody positive 11/18/2016   Anticoagulated 11/17/2016   Atrial fibrillation and flutter ([place]) 11/17/2016   Osteoarthritis involving multiple joints 06/14/2016   Cardiac pacemaker in situ 01/07/2016   S/P cardiac pacemaker procedure 01/07/2016   GERD (gastroesophageal reflux disease) 12/18/2015   Chronic pain syndrome 06/25/2015   Chronic low back pain (1ry area of Pain) (Bilateral) (L>R) w/ sciatica (Bilateral) 06/25/2015   Lumbar facet syndrome (Bilateral) (L>R) 06/25/2015   Lumbar spondylosis 06/25/2015   Chronic lumbar radicular pain (Bilateral) (L>R) 06/25/2015   Restless leg 06/25/2015   Myofascial pain 06/25/2015   Neurogenic pain 06/25/2015   Long term current use of opiate analgesic 06/25/2015   Long term  prescription opiate use 06/25/2015   Opiate use (90 MME/Day) 06/25/2015   Opioid-induced constipation (OIC) 06/25/2015   Vitamin D insufficiency 06/25/2015   Grade 1 Retrolisthesis of L1 over L2 06/25/2015   Lumbar facet hypertrophy (L1-2, L2-3, and L4-5) 06/25/2015   Lumbar lateral recess stenosis (L1-2 and L4-5) 06/25/2015   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) 06/25/2015   Epidural fibrosis 06/25/2015   Lumbar foraminal stenosis (Severe Left L4-5) 06/25/2015   Osteoporosis 06/25/2015   Chronic sacroiliac joint pain (Left) 06/25/2015   History of methicillin resistant staphylococcus aureus (MRSA) 06/25/2015   Mitral valve prolapse 06/25/2015   Seasonal allergies 06/17/2015   History of breast cancer 06/17/2015   Chronic diastolic CHF (congestive heart failure) ([place]) 12/26/2014   Centrilobular emphysema ([place]) 09/16/2014   S/P cardiac catheterization 04/04/2014   DDD (degenerative disc disease), lumbosacral 04/03/2014   Benign hypertension with CKD (chronic kidney disease) stage III ([place]) 04/03/2014   Lumbar central spinal stenosis 04/03/2014   Chronic kidney disease (CKD), stage III (moderate) ([place]) 10/30/2013   Gout 10/30/2013     Past Surgical History   Past Surgical History:  Procedure Laterality Date   ABDOMINAL HYSTERECTOMY     BACK SURGERY  BREAST LUMPECTOMY Right 2004   BREAST SURGERY     rt lumpectomy   CARPAL TUNNEL RELEASE     CATARACT EXTRACTION W/PHACO  10/19/2011   Procedure: CATARACT EXTRACTION PHACO AND INTRAOCULAR LENS PLACEMENT ([place]);  Surgeon: Elta Guadeloupe T. Gershon Crane, MD;  Location: [facility];  Service: Ophthalmology;  Laterality: Right;  CDE:10.81   CATARACT EXTRACTION W/PHACO  11/02/2011   Procedure: CATARACT EXTRACTION PHACO AND INTRAOCULAR LENS PLACEMENT ([facility]);  Surgeon: Elta Guadeloupe T. Gershon Crane, MD;  Location: [facility];  Service: Ophthalmology;  Laterality: Left;  CDE 8.60   CHOLECYSTECTOMY     3/18   COLON SURGERY     INSERT / REPLACE /  REMOVE PACEMAKER     JOINT REPLACEMENT     bilateral TKA   LOWER EXTREMITY ANGIOGRAPHY Left 10/14/2020   Procedure: LOWER EXTREMITY ANGIOGRAPHY;  Surgeon: Katha Cabal, MD;  Location: [facility];  Service: Cardiovascular;  Laterality: Left;   LOWER EXTREMITY ANGIOGRAPHY Right 09/08/2021   Procedure: LOWER EXTREMITY ANGIOGRAPHY;  Surgeon: Katha Cabal, MD;  Location: [facility];  Service: Cardiovascular;  Laterality: Right;   PACEMAKER INSERTION N/A 12/24/2015   Procedure: INSERTION PACEMAKER;  Surgeon: Isaias Cowman, MD;  Location: [facility];  Service: Cardiovascular;  Laterality: N/A;   ROTATOR CUFF REPAIR     bilateral   VIDEO BRONCHOSCOPY WITH ENDOBRONCHIAL NAVIGATION Right 11/11/2021   Procedure: VIDEO BRONCHOSCOPY WITH ENDOBRONCHIAL NAVIGATION;  Surgeon: Ottie Glazier, MD;  Location: [facility];  Service: Thoracic;  Laterality: Right;     Home Medications   Prior to Admission medications   Medication Sig Start Date End Date Taking? Authorizing Provider  acetaminophen (TYLENOL) 500 MG tablet Take 500 mg by mouth in the morning and at bedtime.    [provider]  albuterol (PROVENTIL) (2.5 MG/3ML) 0.083% nebulizer solution USE 1 VIAL IN NEBULIZER EVERY 8 HOURS AS NEEDED FOR WHEEZING OR SHORTNESS OF BREATH. 12/22/21   Karamalegos, Devonne Doughty, DO  apixaban (ELIQUIS) 2.5 MG TABS tablet Take 1 tablet (2.5 mg total) by mouth 2 (two) times daily. 04/09/21   Loletha Grayer, MD  atorvastatin (LIPITOR) 20 MG tablet TAKE 1 TABLET BY MOUTH ONCE DAILY FOR CHOLESTEROL 02/17/21   Karamalegos, Devonne Doughty, DO  bisacodyl (DULCOLAX) 5 MG EC tablet Take 2 tablets (10 mg total) by mouth daily as needed for moderate constipation. 07/04/20   Karamalegos, Alexander J, DO  BREZTRI AEROSPHERE 160-9-4.8 MCG/ACT AERO Inhale 2 puffs into the lungs 2 (two) times daily. 07/07/21   Karamalegos, Devonne Doughty, DO  Cholecalciferol (VITAMIN D) 2000 units tablet Take 2,000 Units by  mouth daily.     [provider]  diclofenac Sodium (VOLTAREN) 1 % GEL Apply 4 g topically 2 (two) times daily as needed (As needed for pain). 10/15/21   Kris Hartmann, NP  empagliflozin (JARDIANCE) 10 MG TABS tablet Take 1 tablet (10 mg total) by mouth daily before breakfast. 12/29/21   Alisa Graff, FNP  feeding supplement (ENSURE ENLIVE / ENSURE PLUS) LIQD Take 237 mLs by mouth 2 (two) times daily between meals. 11/13/21   Annita Brod, MD  furosemide (LASIX) 40 MG tablet Take 1 tablet (40 mg total) by mouth daily. 09/23/21   Ezekiel Slocumb, DO  gabapentin (NEURONTIN) 300 MG capsule Take 1 capsule (300 mg total) by mouth 3 (three) times daily. 11/12/21   Annita Brod, MD  GNP ASPIRIN LOW DOSE 81 MG EC tablet TAKE 1 TABLET BY MOUTH ONCE DAILY. *SWALLOW WHOLE*  08/18/21   Schnier, Dolores Lory, MD  levocetirizine (XYZAL) 5 MG tablet TAKE (1/2) TABLET BY MOUTH EVERY OTHER DAY. 06/19/21   Karamalegos, Devonne Doughty, DO  Melatonin 10 MG TABS Take 10 mg by mouth at bedtime.    [provider]  Multiple Vitamin (MULTIVITAMIN WITH MINERALS) TABS tablet Take 1 tablet by mouth daily. 11/13/21   Annita Brod, MD  oxyCODONE (OXY IR/ROXICODONE) 5 MG immediate release tablet Take 2 tablets (10 mg total) by mouth 2 (two) times daily. May also take 1 tablet (5 mg total) at bedtime as needed for severe pain. Must last 30 days.. 12/23/21 01/22/22  Milinda Pointer, MD  oxyCODONE (OXY IR/ROXICODONE) 5 MG immediate release tablet Take 2 tablets (10 mg total) by mouth 2 (two) times daily. May also take 1 tablet (5 mg total) at bedtime as needed for severe pain. Must last 30 days.. 01/22/22 02/21/22  Milinda Pointer, MD  oxyCODONE (OXY IR/ROXICODONE) 5 MG immediate release tablet Take 2 tablets (10 mg total) by mouth 2 (two) times daily. May also take 1 tablet (5 mg total) at bedtime as needed for severe pain. Must last 30 days.. 02/21/22 03/23/22  Milinda Pointer, MD  OXYGEN Inhale 2 L into the  lungs daily.    [provider]  rOPINIRole (REQUIP) 2 MG tablet TAKE 2 TABLETS BY MOUTH AT BEDTIME 10/21/21   Karamalegos, Devonne Doughty, DO     Allergies  Flexeril [cyclobenzaprine hcl] and Vancomycin   Family History   Family History  Problem Relation Age of Onset   Kidney disease Brother    Heart disease Father    Heart disease Mother    Stroke Mother    Cancer Sister    Breast cancer Sister 2   Breast cancer Paternal Aunt    Anesthesia problems Neg Hx    Hypotension Neg Hx    Malignant hyperthermia Neg Hx    Pseudochol deficiency Neg Hx      Physical Exam  Triage Vital Signs: ED Triage Vitals  Enc Vitals Group     BP      Pulse      Resp      Temp      Temp src      SpO2      Weight      Height      Head Circumference      Peak Flow      Pain Score      Pain Loc      Pain Edu?      Excl. in [place]?     Updated Vital Signs: BP (!) 161/85   Pulse 68   Temp 98.1 F (36.7 C)   Resp 15   SpO2 100%    General: Somnolent but arousable to voice, mild distress.  CV:  RRR.  Good peripheral perfusion.  Resp:  Normal effort.  Scattered rhonchi. Abd:  Nontender.  No distention.  Other:  Somnolent but arousable to voice.  Alert and oriented to person and place.  CN II-XII grossly intact. MAEx4.  Supple neck without meningismus.  No rash.   ED Results / Procedures / Treatments  Labs (all labs ordered are listed, but only abnormal results are displayed) Labs Reviewed  CBC WITH DIFFERENTIAL/PLATELET - Abnormal; Notable for the following components:      Result Value   MCHC 29.6 (*)    RDW 18.4 (*)    Lymphs Abs 0.6 (*)    All other components within  normal limits  COMPREHENSIVE METABOLIC PANEL - Abnormal; Notable for the following components:   Glucose, Bld 120 (*)    Creatinine, Ser 1.50 (*)    Total Protein 8.3 (*)    Albumin 3.3 (*)    GFR, Estimated 33 (*)    All other components within normal limits  BLOOD GAS, ARTERIAL - Abnormal; Notable  for the following components:   pH, Arterial 7.27 (*)    pCO2 arterial 50 (*)    pO2, Arterial 60 (*)    Acid-base deficit 4.3 (*)    All other components within normal limits  TROPONIN I (HIGH SENSITIVITY) - Abnormal; Notable for the following components:   Troponin I (High Sensitivity) 62 (*)    All other components within normal limits  SARS CORONAVIRUS 2 BY RT PCR  CULTURE, BLOOD (ROUTINE X 2)  CULTURE, BLOOD (ROUTINE X 2)  URINE CULTURE  LACTIC ACID, PLASMA  LACTIC ACID, PLASMA  URINALYSIS, ROUTINE W REFLEX MICROSCOPIC  BLOOD GAS, ARTERIAL  TROPONIN I (HIGH SENSITIVITY)     EKG  ED ECG REPORT I, Cyniah Gossard J, the attending physician, personally viewed and interpreted this ECG.   Date: 01/17/2022  EKG Time: 2330  Rate: 64  Rhythm: normal sinus rhythm  Axis: LAD  Intervals:left anterior fascicular block  ST&T Change: Nonspecific    RADIOLOGY I have independently visualized and interpreted patient's chest x-ray as well as noted the radiology interpretation:  Chest x-ray: Multifocal pneumonia  Official radiology report(s): DG Chest Port 1 View  Result Date: 01/16/2022 CLINICAL DATA:  Weakness, shortness of breath EXAM: PORTABLE CHEST 1 VIEW COMPARISON:  CTA chest dated 12/18/2021 FINDINGS: Multifocal patchy opacities, progressive when compared to the prior. The patient has known right upper lobe tumor and multifocal scarring. Given progression, superimposed pneumonia is not excluded, although some degree of progressive tumor could account for the progression in the right upper lobe. No pleural effusion or pneumothorax. Cardiomegaly. Left subclavian pacemaker. Thoracic aortic atherosclerosis. Bilateral shoulder arthroplasties. IMPRESSION: Multifocal patchy opacities, progressive when compared to prior. While some of this may reflect tumor in the right upper lobe, superimposed multifocal pneumonia is suspected. Electronically Signed   By: Julian Hy M.D.   On:  01/16/2022 23:40     PROCEDURES:  Critical Care performed: Yes, see critical care procedure note(s)  CRITICAL CARE Performed by: Paulette Blanch   Total critical care time: 30 minutes  Critical care time was exclusive of separately billable procedures and treating other patients.  Critical care was necessary to treat or prevent imminent or life-threatening deterioration.  Critical care was time spent personally by me on the following activities: development of treatment plan with patient and/or surrogate as well as nursing, discussions with consultants, evaluation of patient's response to treatment, examination of patient, obtaining history from patient or surrogate, ordering and performing treatments and interventions, ordering and review of laboratory studies, ordering and review of radiographic studies, pulse oximetry and re-evaluation of patient's condition.   Marland Kitchen1-3 Lead EKG Interpretation Performed by: Paulette Blanch, MD Authorized by: Paulette Blanch, MD     Interpretation: normal     ECG rate:  60   ECG rate assessment: normal     Rhythm: sinus rhythm     Ectopy: none     Conduction: normal   Comments:     Patient placed on cardiac monitor to evaluate for arrhythmias   MEDICATIONS ORDERED IN ED: Medications  acetaminophen (TYLENOL) tablet 650 mg (has no administration in time range)  Or  acetaminophen (TYLENOL) suppository 650 mg (has no administration in time range)  cefTRIAXone (ROCEPHIN) 2 g in sodium chloride 0.9 % 100 mL IVPB (has no administration in time range)  azithromycin (ZITHROMAX) 500 mg in sodium chloride 0.9 % 250 mL IVPB (has no administration in time range)  0.9 %  sodium chloride infusion (has no administration in time range)  apixaban (ELIQUIS) tablet 2.5 mg (has no administration in time range)  cefTRIAXone (ROCEPHIN) 1 g in sodium chloride 0.9 % 100 mL IVPB (has no administration in time range)  sodium chloride 0.9 % bolus 500 mL (0 mLs Intravenous  Stopped 01/17/22 0155)  cefTRIAXone (ROCEPHIN) 1 g in sodium chloride 0.9 % 100 mL IVPB (0 g Intravenous Stopped 01/17/22 0155)  azithromycin (ZITHROMAX) 500 mg in sodium chloride 0.9 % 250 mL IVPB (0 mg Intravenous Stopped 01/17/22 0302)     IMPRESSION / MDM / ASSESSMENT AND PLAN / ED COURSE  I reviewed the triage vital signs and the nursing notes.                             88 year old female presenting with weakness, somnolence and cough.  I have identified the patient to have a potentially life-threatening illness. Differential includes, but is not limited to, viral syndrome, bronchitis including COPD exacerbation, pneumonia, reactive airway disease including asthma, CHF including exacerbation with or without pulmonary/interstitial edema, pneumothorax, ACS, thoracic trauma, and pulmonary embolism.   I have personally reviewed patient's records and see that she had a recent vascular surgery visit on 01/15/2022 for atherosclerosis of the extremities with ulceration.  The patient is on the cardiac monitor to evaluate for evidence of arrhythmia and/or significant heart rate changes.  We will obtain sepsis protocol lab work, UA and chest x-ray.  Initiate IV fluid hydration.  Anticipate hospitalization.  Clinical Course as of 01/17/22 8675  Sat Jan 16, 2022  2355 Son is at bedside.  Updated him of chest x-ray demonstrating multifocal pneumonia.  Will initiate IV fluids, broad-spectrum IV antibiotics.  Anticipate hospitalization. [JS]  Sun Jan 17, 2022  0010 Of note, son states patient is FULL CODE [JS]  [phone] Laboratory results demonstrate normal WBC 4.4, AKI with creatinine 1.5, initial troponin mildly elevated at 62, lactic acid unremarkable, COVID is negative.  Will consult hospitalist services for evaluation and admission [JS]  0034 QTC 435 tonight; feel it would be safe to administer IV Azithromycin. [JS]    Clinical Course User Index [JS] Paulette Blanch, MD     FINAL CLINICAL  IMPRESSION(S) / ED DIAGNOSES   Final diagnoses:  Weakness generalized  Community acquired pneumonia, unspecified laterality  AKI (acute kidney injury) ([place])     Rx / DC Orders   ED Discharge Orders     None        Note:  This document was prepared using Dragon voice recognition software and may include unintentional dictation errors.   Paulette Blanch, MD 01/17/22 [phone]

## 2022-01-16 NOTE — ED Provider Notes (Incomplete)
[facility] Provider Note    Event Date/Time   First MD Initiated Contact with Patient 01/16/22 2307     (approximate)   History   Weakness   HPI {Remember to add pertinent medical, surgical, social, and/or OB history to HPI:1} Kristin [name] is a 88 y.o. female  ***       Past Medical History   Past Medical History:  Diagnosis Date  . Acute postoperative pain 02/09/2018  . Arrhythmia, sinus node 04/03/2014  . Arthritis   . Arthritis   . Atrial fibrillation ([place])   . Back pain    lower back chronic  . Bradycardia   . Breast cancer ([place]) 2004   right breast cancer  . Cancer [facility]) 2004   rt breast cancer-post lumpectomy- chemo/rad  . CHF (congestive heart failure) ([place])   . Chronic back pain   . CKD (chronic kidney disease)   . COPD (chronic obstructive pulmonary disease) (HCC)    wears O2 at 2L via [place] at night  . COVID-19 06/11/2020  . Cystocele   . Decubitus ulcers   . Dehydration   . Gout   . Hematuria   . Hepatitis C   . Hyperlipidemia   . Hypertension   . Hypomagnesemia 06/25/2015  . NSTEMI (non-ST elevated myocardial infarction) ([place]) 04/08/2021  . OAB (overactive bladder)   . Overactive bladder   . Overdose opiate, accidental or unintentional, sequela 10/02/2020  . Restless leg   . Shoulder pain, bilateral   . Urinary frequency   . UTI (lower urinary tract infection)   . Vaginal atrophy      Active Problem List   Patient Active Problem List   Diagnosis Date Noted  . Sepsis ([place]) 12/18/2021  . Palliative care encounter   . Hypertension 12/17/2021  . AMS (altered mental status) 12/17/2021  . Malignant neoplasm of right upper lobe of lung ([place]) 12/02/2021  . Small cell lung cancer ([place]) 11/17/2021  . Goals of care, counseling/discussion 11/17/2021  . Overweight (BMI 25.0-29.9) 11/11/2021  . Bacteremia 11/06/2021  . Postobstructive pneumonia 11/05/2021  . Mass of upper lobe of right lung 11/05/2021  . Sepsis due to  Escherichia coli (E. coli) ([place]) 11/05/2021  . Dyslipidemia 11/05/2021  . Lymphedema 09/17/2021  . Ischemic leg pain (Bilateral) 08/19/2021  . Paroxysmal atrial fibrillation (HCC)   . History of atrial fibrillation 04/08/2021  . PVD (peripheral vascular disease) ([place]) 04/08/2021  . Low blood pressure reading 04/07/2021  . Physical tolerance to opiate drug 01/28/2021  . Chronic use of opiate for therapeutic purpose 11/24/2020  . Atherosclerosis of native arteries of the extremities with ulceration ([place]) 11/03/2020  . Uncomplicated opioid dependence ([place]) 10/20/2020  . Encounter for chronic pain management 10/02/2020  . Encounter for pain management counseling 10/02/2020  . Chronic kidney disease 10/02/2020  . Chronic sacroiliac pain (Bilateral) 09/30/2020  . Osteoarthritis of sacroiliac joints (Bilateral) ([place]) 09/30/2020  . Chronic midline low back pain without sciatica 09/30/2020  . Prolonged QT interval 08/02/2020  . Venous insufficiency of both lower extremities 07/04/2020  . Varicose veins of left lower extremity with inflammation, with ulcer of ankle limited to breakdown of skin ([place]) 04/01/2020  . Opioid dependence with opioid-induced disorder ([place]) 02/14/2020  . Abnormal MRI, lumbar spine (11/03/2019) 01/23/2020  . Pharmacologic therapy 12/20/2019  . Other specified dorsopathies, sacral and sacrococcygeal region 06/12/2019  . Chronic hip pain (Left) 06/12/2019  . Osteoarthritis of hip (Left) 06/12/2019  . Greater trochanteric bursitis of  hip (Left) 03/12/2019  . Preop testing 01/22/2019  . Cervicalgia 10/03/2018  . Chronic shoulders pain (Bilateral) (L>R) 05/29/2018  . Osteoarthritis of shoulder (Bilateral) 05/29/2018  . Osteoarthritis of hip (Bilateral) 05/29/2018  . Chronic shoulder pain after shoulder replacement (Bilateral) (L>R) 05/29/2018  . Trigger finger, left middle finger 05/29/2018  . Spondylosis without myelopathy or radiculopathy, lumbosacral region 02/09/2018   . Insomnia 02/01/2018  . Numbness and tingling of upper extremity (Bilateral) 09/07/2017  . Chronic shoulder pain, Radicular (Bilateral) (L>R) 09/07/2017  . Cervical facet syndrome (Bilateral) (L>R) 09/07/2017  . Cervical (3-4 mm) Anterolisthesis of C4 on C5 09/07/2017  . Cervical foraminal stenosis (Bilateral) 09/07/2017  . Acute kidney injury superimposed on CKD ([place]) 08/14/2017  . Chronic lower extremity pain (2ry area of Pain) (Bilateral) (L>R) 05/13/2017  . DDD (degenerative disc disease), cervical 05/13/2017  . Chronic cervical radicular pain (3ry area of Pain) (Bilateral) (L>R) 05/04/2017  . Chronic anticoagulation ([place]) 05/04/2017  . Chronic hip pain (Bilateral) 04/20/2017  . Chronic neck pain 03/01/2017  . Hyperlipidemia 02/02/2017  . Red blood cell antibody positive 11/18/2016  . Anticoagulated 11/17/2016  . Atrial fibrillation and flutter ([place]) 11/17/2016  . Osteoarthritis involving multiple joints 06/14/2016  . Cardiac pacemaker in situ 01/07/2016  . S/P cardiac pacemaker procedure 01/07/2016  . GERD (gastroesophageal reflux disease) 12/18/2015  . Chronic pain syndrome 06/25/2015  . Chronic low back pain (1ry area of Pain) (Bilateral) (L>R) w/ sciatica (Bilateral) 06/25/2015  . Lumbar facet syndrome (Bilateral) (L>R) 06/25/2015  . Lumbar spondylosis 06/25/2015  . Chronic lumbar radicular pain (Bilateral) (L>R) 06/25/2015  . Restless leg syndrome 06/25/2015  . Myofascial pain 06/25/2015  . Neurogenic pain 06/25/2015  . Long term current use of opiate analgesic 06/25/2015  . Long term prescription opiate use 06/25/2015  . Opiate use (90 MME/Day) 06/25/2015  . Opioid-induced constipation (OIC) 06/25/2015  . Vitamin D insufficiency 06/25/2015  . Grade 1 Retrolisthesis of L1 over L2 06/25/2015  . Lumbar facet hypertrophy (L1-2, L2-3, and L4-5) 06/25/2015  . Lumbar lateral recess stenosis (L1-2 and L4-5) 06/25/2015  . 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 Epidural fibrosis 06/25/2015  . Lumbar foraminal stenosis (Severe Left L4-5) 06/25/2015  . Osteoporosis 06/25/2015  . Chronic sacroiliac joint pain (Left) 06/25/2015  . History of methicillin resistant staphylococcus aureus (MRSA) 06/25/2015  . Mitral valve prolapse 06/25/2015  . Seasonal allergies 06/17/2015  . History of breast cancer 06/17/2015  . Chronic diastolic CHF (congestive heart failure) ([place]) 12/26/2014  . Centrilobular emphysema ([place]) 09/16/2014  . S/P cardiac catheterization 04/04/2014  . DDD (degenerative disc disease), lumbosacral 04/03/2014  . Benign hypertension with CKD (chronic kidney disease) stage III ([place]) 04/03/2014  . Lumbar central spinal stenosis 04/03/2014  . Chronic kidney disease (CKD), stage III (moderate) ([place]) 10/30/2013  . Gout 10/30/2013     Past Surgical History   Past Surgical History:  Procedure Laterality Date  . ABDOMINAL HYSTERECTOMY    . BACK SURGERY    . BREAST LUMPECTOMY Right 2004  . BREAST SURGERY     rt lumpectomy  . CARPAL TUNNEL RELEASE    . CATARACT EXTRACTION W/PHACO  10/19/2011   Procedure: CATARACT EXTRACTION PHACO AND INTRAOCULAR LENS PLACEMENT (IOC);  Surgeon: Elta Guadeloupe T. Gershon Crane, MD;  Location: [facility];  Service: Ophthalmology;  Laterality: Right;  CDE:10.81  . CATARACT EXTRACTION W/PHACO  11/02/2011   Procedure: CATARACT EXTRACTION PHACO AND INTRAOCULAR LENS PLACEMENT (IOC);  Surgeon: Elta Guadeloupe T. Gershon Crane, MD;  Location: [facility];  Service: Ophthalmology;  Laterality: Left;  CDE 8.60  . CHOLECYSTECTOMY     3/18  . COLON SURGERY    . INSERT / REPLACE / REMOVE PACEMAKER    . JOINT REPLACEMENT     bilateral TKA  . LOWER EXTREMITY ANGIOGRAPHY Left 10/14/2020   Procedure: LOWER EXTREMITY ANGIOGRAPHY;  Surgeon: Katha Cabal, MD;  Location: [place] [facility];  Service: Cardiovascular;  Laterality: Left;  . LOWER EXTREMITY ANGIOGRAPHY Right 09/08/2021   Procedure: LOWER EXTREMITY  ANGIOGRAPHY;  Surgeon: Katha Cabal, MD;  Location: [place] [facility];  Service: Cardiovascular;  Laterality: Right;  . PACEMAKER INSERTION N/A 12/24/2015   Procedure: INSERTION PACEMAKER;  Surgeon: Isaias Cowman, MD;  Location: [facility] ORS;  Service: Cardiovascular;  Laterality: N/A;  . ROTATOR CUFF REPAIR     bilateral  . VIDEO BRONCHOSCOPY WITH ENDOBRONCHIAL NAVIGATION Right 11/11/2021   Procedure: VIDEO BRONCHOSCOPY WITH ENDOBRONCHIAL NAVIGATION;  Surgeon: Ottie Glazier, MD;  Location: [facility] ORS;  Service: Thoracic;  Laterality: Right;     Home Medications   Prior to Admission medications   Medication Sig Start Date End Date Taking? Authorizing Provider  acetaminophen (TYLENOL) 500 MG tablet Take 500 mg by mouth in the morning and at bedtime.    [provider]  albuterol (PROVENTIL) (2.5 MG/3ML) 0.083% nebulizer solution USE 1 VIAL IN NEBULIZER EVERY 8 HOURS AS NEEDED FOR WHEEZING OR SHORTNESS OF BREATH. 12/22/21   Karamalegos, Devonne Doughty, DO  apixaban (ELIQUIS) 2.5 MG TABS tablet Take 1 tablet (2.5 mg total) by mouth 2 (two) times daily. 04/09/21   Loletha Grayer, MD  atorvastatin (LIPITOR) 20 MG tablet TAKE 1 TABLET BY MOUTH ONCE DAILY FOR CHOLESTEROL 02/17/21   Karamalegos, Devonne Doughty, DO  bisacodyl (DULCOLAX) 5 MG EC tablet Take 2 tablets (10 mg total) by mouth daily as needed for moderate constipation. 07/04/20   Karamalegos, Alexander J, DO  BREZTRI AEROSPHERE 160-9-4.8 MCG/ACT AERO Inhale 2 puffs into the lungs 2 (two) times daily. 07/07/21   Karamalegos, Devonne Doughty, DO  Cholecalciferol (VITAMIN D) 2000 units tablet Take 2,000 Units by mouth daily.     [provider]  diclofenac Sodium (VOLTAREN) 1 % GEL Apply 4 g topically 2 (two) times daily as needed (As needed for pain). 10/15/21   Kris Hartmann, NP  empagliflozin (JARDIANCE) 10 MG TABS tablet Take 1 tablet (10 mg total) by mouth daily before breakfast. 12/29/21   Alisa Graff, FNP  feeding  supplement (ENSURE ENLIVE / ENSURE PLUS) LIQD Take 237 mLs by mouth 2 (two) times daily between meals. 11/13/21   Annita Brod, MD  furosemide (LASIX) 40 MG tablet Take 1 tablet (40 mg total) by mouth daily. 09/23/21   Ezekiel Slocumb, DO  gabapentin (NEURONTIN) 300 MG capsule Take 1 capsule (300 mg total) by mouth 3 (three) times daily. 11/12/21   Annita Brod, MD  GNP ASPIRIN LOW DOSE 81 MG EC tablet TAKE 1 TABLET BY MOUTH ONCE DAILY. *SWALLOW WHOLE* 08/18/21   Schnier, Dolores Lory, MD  levocetirizine (XYZAL) 5 MG tablet TAKE (1/2) TABLET BY MOUTH EVERY OTHER DAY. 06/19/21   Karamalegos, Devonne Doughty, DO  Melatonin 10 MG TABS Take 10 mg by mouth at bedtime.    [provider]  Multiple Vitamin (MULTIVITAMIN WITH MINERALS) TABS tablet Take 1 tablet by mouth daily. 11/13/21   Annita Brod, MD  oxyCODONE (OXY IR/ROXICODONE) 5 MG immediate release tablet Take 2 tablets (10 mg total) by mouth  2 (two) times daily. May also take 1 tablet (5 mg total) at bedtime as needed for severe pain. Must last 30 days.. 12/23/21 01/22/22  Milinda Pointer, MD  oxyCODONE (OXY IR/ROXICODONE) 5 MG immediate release tablet Take 2 tablets (10 mg total) by mouth 2 (two) times daily. May also take 1 tablet (5 mg total) at bedtime as needed for severe pain. Must last 30 days.. 01/22/22 02/21/22  Milinda Pointer, MD  oxyCODONE (OXY IR/ROXICODONE) 5 MG immediate release tablet Take 2 tablets (10 mg total) by mouth 2 (two) times daily. May also take 1 tablet (5 mg total) at bedtime as needed for severe pain. Must last 30 days.. 02/21/22 03/23/22  Milinda Pointer, MD  OXYGEN Inhale 2 L into the lungs daily.    [provider]  rOPINIRole (REQUIP) 2 MG tablet TAKE 2 TABLETS BY MOUTH AT BEDTIME 10/21/21   Karamalegos, Devonne Doughty, DO     Allergies  Flexeril [cyclobenzaprine hcl] and Vancomycin   Family History   Family History  Problem Relation Age of Onset  . Kidney disease Brother   . Heart disease  Father   . Heart disease Mother   . Stroke Mother   . Cancer Sister   . Breast cancer Sister 62  . Breast cancer Paternal Aunt   . Anesthesia problems Neg Hx   . Hypotension Neg Hx   . Malignant hyperthermia Neg Hx   . Pseudochol deficiency Neg Hx      Physical Exam  Triage Vital Signs: ED Triage Vitals  Enc Vitals Group     BP      Pulse      Resp      Temp      Temp src      SpO2      Weight      Height      Head Circumference      Peak Flow      Pain Score      Pain Loc      Pain Edu?      Excl. in [place]?     Updated Vital Signs: BP 120/78   Pulse 60   Temp 98.1 F (36.7 C)   Resp 18   SpO2 99%   {Only need to document appropriate and relevant physical exam:1} General: Awake, no distress. *** CV:  Good peripheral perfusion. *** Resp:  Normal effort. *** Abd:  No distention. *** Other:  ***   ED Results / Procedures / Treatments  Labs (all labs ordered are listed, but only abnormal results are displayed) Labs Reviewed  CBC WITH DIFFERENTIAL/PLATELET - Abnormal; Notable for the following components:      Result Value   MCHC 29.6 (*)    RDW 18.4 (*)    Lymphs Abs 0.6 (*)    All other components within normal limits  CULTURE, BLOOD (ROUTINE X 2)  CULTURE, BLOOD (ROUTINE X 2)  URINE CULTURE  SARS CORONAVIRUS 2 BY RT PCR  COMPREHENSIVE METABOLIC PANEL  LACTIC ACID, PLASMA  LACTIC ACID, PLASMA  URINALYSIS, ROUTINE W REFLEX MICROSCOPIC  BLOOD GAS, ARTERIAL  TROPONIN I (HIGH SENSITIVITY)     EKG  ***   RADIOLOGY *** {You MUST document your own interpretation of imaging, as well as the fact that you reviewed the radiologist's report!:1}  Official radiology report(s): DG Chest Port 1 View  Result Date: 01/16/2022 CLINICAL DATA:  Weakness, shortness of breath EXAM: PORTABLE CHEST 1 VIEW COMPARISON:  CTA chest dated 12/18/2021 FINDINGS:  Multifocal patchy opacities, progressive when compared to the prior. The patient has known right upper lobe  tumor and multifocal scarring. Given progression, superimposed pneumonia is not excluded, although some degree of progressive tumor could account for the progression in the right upper lobe. No pleural effusion or pneumothorax. Cardiomegaly. Left subclavian pacemaker. Thoracic aortic atherosclerosis. Bilateral shoulder arthroplasties. IMPRESSION: Multifocal patchy opacities, progressive when compared to prior. While some of this may reflect tumor in the right upper lobe, superimposed multifocal pneumonia is suspected. Electronically Signed   By: Julian Hy M.D.   On: 01/16/2022 23:40     PROCEDURES:  Critical Care performed: {CriticalCareYesNo:19197::"Yes, see critical care procedure note(s)","No"}  Procedures   MEDICATIONS ORDERED IN ED: Medications  sodium chloride 0.9 % bolus 500 mL (500 mLs Intravenous New Bag/Given 01/16/22 2348)     IMPRESSION / MDM / ASSESSMENT AND PLAN / ED COURSE  I reviewed the triage vital signs and the nursing notes.                              Differential diagnosis includes, but is not limited to, ***  {If the patient is on the monitor, remove the brackets and asterisks on the sentence below and remember to document it as a Procedure as well. Otherwise delete the sentence below:1} {**The patient is on the cardiac monitor to evaluate for evidence of arrhythmia and/or significant heart rate changes.**}  {Remember to include, when applicable, any/all of the following data: independent review of imaging independent review of labs (comment specifically on pertinent positives and negatives) review of specific prior hospitalizations, PCP/specialist notes, etc. discuss meds given and prescribed document any discussion with consultants (including hospitalists) any clinical decision tools you used and why (PECARN, NEXUS, etc.) did you consider admitting the patient? document social determinants of health affecting patient's care (homelessness, inability  to follow up in a timely fashion, etc) document any pre-existing conditions increasing risk on current visit (e.g. diabetes and HTN increasing danger of high-risk chest pain/ACS) describes what meds you gave (especially parenteral) and why any other interventions?:1}  Clinical Course as of 01/16/22 2357  Sat Jan 16, 2022  2355 Son is at bedside.  Updated him of chest x-ray demonstrating multifocal pneumonia.  Will initiate IV fluids, broad-spectrum IV antibiotics.  Anticipate hospitalization. [JS]    Clinical Course User Index [JS] Paulette Blanch, MD     FINAL CLINICAL IMPRESSION(S) / ED DIAGNOSES   Final diagnoses:  Weakness generalized  Community acquired pneumonia, unspecified laterality     Rx / DC Orders   ED Discharge Orders     None        Note:  This document was prepared using Dragon voice recognition software and may include unintentional dictation errors.

## 2022-01-16 NOTE — ED Triage Notes (Signed)
Pt here from home with c/o gen weakness , family called pt and stated that she was more tired than usual , cbg 144 according to EMS , pt was able to walk to stretcher , pt sleepy but will answer questions when prompted

## 2022-01-17 ENCOUNTER — Inpatient Hospital Stay
Admit: 2022-01-17 | Discharge: 2022-01-17 | Disposition: A | Discharge status: Home / Self Care | Payer: Medicare HMO | Attending: Pulmonary Disease | Admitting: Pulmonary Disease

## 2022-01-17 ENCOUNTER — Inpatient Hospital Stay: Payer: Self-pay

## 2022-01-17 ENCOUNTER — Other Ambulatory Visit: Payer: Self-pay

## 2022-01-17 ENCOUNTER — Inpatient Hospital Stay: Payer: Medicare HMO

## 2022-01-17 DIAGNOSIS — I252 Old myocardial infarction: Secondary | ICD-10-CM | POA: Diagnosis not present

## 2022-01-17 DIAGNOSIS — J849 Interstitial pulmonary disease, unspecified: Secondary | ICD-10-CM | POA: Diagnosis not present

## 2022-01-17 DIAGNOSIS — Z85118 Personal history of other malignant neoplasm of bronchus and lung: Secondary | ICD-10-CM | POA: Diagnosis not present

## 2022-01-17 DIAGNOSIS — J439 Emphysema, unspecified: Secondary | ICD-10-CM | POA: Diagnosis not present

## 2022-01-17 DIAGNOSIS — J189 Pneumonia, unspecified organism: Secondary | ICD-10-CM

## 2022-01-17 DIAGNOSIS — J90 Pleural effusion, not elsewhere classified: Secondary | ICD-10-CM | POA: Diagnosis not present

## 2022-01-17 DIAGNOSIS — Z515 Encounter for palliative care: Secondary | ICD-10-CM | POA: Diagnosis not present

## 2022-01-17 DIAGNOSIS — I480 Paroxysmal atrial fibrillation: Secondary | ICD-10-CM | POA: Diagnosis present

## 2022-01-17 DIAGNOSIS — Z8616 Personal history of COVID-19: Secondary | ICD-10-CM | POA: Diagnosis not present

## 2022-01-17 DIAGNOSIS — R569 Unspecified convulsions: Secondary | ICD-10-CM | POA: Diagnosis not present

## 2022-01-17 DIAGNOSIS — J9602 Acute respiratory failure with hypercapnia: Secondary | ICD-10-CM | POA: Diagnosis not present

## 2022-01-17 DIAGNOSIS — G9341 Metabolic encephalopathy: Secondary | ICD-10-CM | POA: Diagnosis present

## 2022-01-17 DIAGNOSIS — R4182 Altered mental status, unspecified: Secondary | ICD-10-CM | POA: Diagnosis not present

## 2022-01-17 DIAGNOSIS — R627 Adult failure to thrive: Secondary | ICD-10-CM | POA: Diagnosis present

## 2022-01-17 DIAGNOSIS — Z853 Personal history of malignant neoplasm of breast: Secondary | ICD-10-CM | POA: Diagnosis not present

## 2022-01-17 DIAGNOSIS — Z4682 Encounter for fitting and adjustment of non-vascular catheter: Secondary | ICD-10-CM | POA: Diagnosis not present

## 2022-01-17 DIAGNOSIS — Z9981 Dependence on supplemental oxygen: Secondary | ICD-10-CM | POA: Diagnosis not present

## 2022-01-17 DIAGNOSIS — I248 Other forms of acute ischemic heart disease: Secondary | ICD-10-CM | POA: Diagnosis present

## 2022-01-17 DIAGNOSIS — R0902 Hypoxemia: Secondary | ICD-10-CM | POA: Diagnosis not present

## 2022-01-17 DIAGNOSIS — C3411 Malignant neoplasm of upper lobe, right bronchus or lung: Secondary | ICD-10-CM | POA: Diagnosis present

## 2022-01-17 DIAGNOSIS — J690 Pneumonitis due to inhalation of food and vomit: Secondary | ICD-10-CM | POA: Diagnosis present

## 2022-01-17 DIAGNOSIS — N179 Acute kidney failure, unspecified: Secondary | ICD-10-CM | POA: Diagnosis present

## 2022-01-17 DIAGNOSIS — I517 Cardiomegaly: Secondary | ICD-10-CM | POA: Diagnosis not present

## 2022-01-17 DIAGNOSIS — G2581 Restless legs syndrome: Secondary | ICD-10-CM | POA: Diagnosis present

## 2022-01-17 DIAGNOSIS — Z86718 Personal history of other venous thrombosis and embolism: Secondary | ICD-10-CM | POA: Diagnosis not present

## 2022-01-17 DIAGNOSIS — J449 Chronic obstructive pulmonary disease, unspecified: Secondary | ICD-10-CM | POA: Diagnosis not present

## 2022-01-17 DIAGNOSIS — Z7189 Other specified counseling: Secondary | ICD-10-CM | POA: Diagnosis not present

## 2022-01-17 DIAGNOSIS — C3490 Malignant neoplasm of unspecified part of unspecified bronchus or lung: Secondary | ICD-10-CM | POA: Diagnosis not present

## 2022-01-17 DIAGNOSIS — J9621 Acute and chronic respiratory failure with hypoxia: Secondary | ICD-10-CM | POA: Diagnosis present

## 2022-01-17 DIAGNOSIS — E785 Hyperlipidemia, unspecified: Secondary | ICD-10-CM | POA: Diagnosis present

## 2022-01-17 DIAGNOSIS — N1832 Chronic kidney disease, stage 3b: Secondary | ICD-10-CM | POA: Diagnosis present

## 2022-01-17 DIAGNOSIS — R0602 Shortness of breath: Secondary | ICD-10-CM | POA: Diagnosis not present

## 2022-01-17 DIAGNOSIS — J432 Centrilobular emphysema: Secondary | ICD-10-CM | POA: Diagnosis present

## 2022-01-17 DIAGNOSIS — I509 Heart failure, unspecified: Secondary | ICD-10-CM | POA: Diagnosis not present

## 2022-01-17 DIAGNOSIS — R531 Weakness: Secondary | ICD-10-CM | POA: Diagnosis not present

## 2022-01-17 DIAGNOSIS — B952 Enterococcus as the cause of diseases classified elsewhere: Secondary | ICD-10-CM | POA: Diagnosis present

## 2022-01-17 DIAGNOSIS — Z66 Do not resuscitate: Secondary | ICD-10-CM | POA: Diagnosis present

## 2022-01-17 DIAGNOSIS — J9601 Acute respiratory failure with hypoxia: Secondary | ICD-10-CM | POA: Diagnosis not present

## 2022-01-17 DIAGNOSIS — J9600 Acute respiratory failure, unspecified whether with hypoxia or hypercapnia: Secondary | ICD-10-CM | POA: Diagnosis present

## 2022-01-17 DIAGNOSIS — I5033 Acute on chronic diastolic (congestive) heart failure: Secondary | ICD-10-CM | POA: Diagnosis present

## 2022-01-17 DIAGNOSIS — I130 Hypertensive heart and chronic kidney disease with heart failure and stage 1 through stage 4 chronic kidney disease, or unspecified chronic kidney disease: Secondary | ICD-10-CM | POA: Diagnosis present

## 2022-01-17 DIAGNOSIS — N390 Urinary tract infection, site not specified: Secondary | ICD-10-CM | POA: Diagnosis present

## 2022-01-17 DIAGNOSIS — E860 Dehydration: Secondary | ICD-10-CM | POA: Diagnosis present

## 2022-01-17 DIAGNOSIS — E440 Moderate protein-calorie malnutrition: Secondary | ICD-10-CM | POA: Diagnosis present

## 2022-01-17 DIAGNOSIS — J9690 Respiratory failure, unspecified, unspecified whether with hypoxia or hypercapnia: Secondary | ICD-10-CM | POA: Diagnosis not present

## 2022-01-17 DIAGNOSIS — R64 Cachexia: Secondary | ICD-10-CM | POA: Diagnosis present

## 2022-01-17 DIAGNOSIS — R918 Other nonspecific abnormal finding of lung field: Secondary | ICD-10-CM | POA: Diagnosis not present

## 2022-01-17 DIAGNOSIS — J9622 Acute and chronic respiratory failure with hypercapnia: Secondary | ICD-10-CM | POA: Diagnosis present

## 2022-01-17 LAB — GLUCOSE, CAPILLARY
Glucose-Capillary: 68 mg/dL — ABNORMAL LOW (ref 70–99)
Glucose-Capillary: 100 mg/dL — ABNORMAL HIGH (ref 70–99)
Glucose-Capillary: 64 mg/dL — ABNORMAL LOW (ref 70–99)
Glucose-Capillary: 64 mg/dL — ABNORMAL LOW (ref 70–99)
Glucose-Capillary: 110 mg/dL — ABNORMAL HIGH (ref 70–99)
Glucose-Capillary: 133 mg/dL — ABNORMAL HIGH (ref 70–99)
Glucose-Capillary: 135 mg/dL — ABNORMAL HIGH (ref 70–99)
Glucose-Capillary: 150 mg/dL — ABNORMAL HIGH (ref 70–99)

## 2022-01-17 LAB — LACTIC ACID, PLASMA
Lactic Acid, Venous: 0.9 mmol/L (ref 0.5–1.9)
Lactic Acid, Venous: 1.5 mmol/L (ref 0.5–1.9)

## 2022-01-17 LAB — CBC
HCT: 35.2 % — ABNORMAL LOW (ref 36.0–46.0)
Hemoglobin: 10 g/dL — ABNORMAL LOW (ref 12.0–15.0)
MCH: 26.2 pg (ref 26.0–34.0)
MCHC: 28.4 g/dL — ABNORMAL LOW (ref 30.0–36.0)
MCV: 92.4 fL (ref 80.0–100.0)
Platelets: 204 10*3/uL (ref 150–400)
RBC: 3.81 MIL/uL — ABNORMAL LOW (ref 3.87–5.11)
RDW: 18.4 % — ABNORMAL HIGH (ref 11.5–15.5)
WBC: 6.7 10*3/uL (ref 4.0–10.5)
nRBC: 0 % (ref 0.0–0.2)

## 2022-01-17 LAB — PHOSPHORUS: Phosphorus: 5 mg/dL — ABNORMAL HIGH (ref 2.5–4.6)

## 2022-01-17 LAB — BLOOD CULTURE ID PANEL (REFLEXED) - BCID2

## 2022-01-17 LAB — BASIC METABOLIC PANEL
Anion gap: 7 (ref 5–15)
BUN: 21 mg/dL (ref 8–23)
CO2: 23 mmol/L (ref 22–32)
Calcium: 8.6 mg/dL — ABNORMAL LOW (ref 8.9–10.3)
Chloride: 106 mmol/L (ref 98–111)
Creatinine, Ser: 1.34 mg/dL — ABNORMAL HIGH (ref 0.44–1.00)
GFR, Estimated: 38 mL/min — ABNORMAL LOW (ref >60)
Glucose, Bld: 81 mg/dL (ref 70–99)
Potassium: 4.1 mmol/L (ref 3.5–5.1)
Sodium: 136 mmol/L (ref 135–145)

## 2022-01-17 LAB — BLOOD GAS, ARTERIAL
Acid-base deficit: 6.1 mmol/L — ABNORMAL HIGH (ref 0.0–2.0)
Acid-base deficit: 4.1 mmol/L — ABNORMAL HIGH (ref 0.0–2.0)
Bicarbonate: 22.3 mmol/L (ref 20.0–28.0)
Bicarbonate: 23 mmol/L (ref 20.0–28.0)
FIO2: 60 %
FIO2: 40 %
MECHVT: 400 mL
MECHVT: 470 mL
Mechanical Rate: 20
O2 Saturation: 100 %
O2 Saturation: 96.1 %
PEEP: 5 cmH2O
PEEP: 5 cmH2O
Patient temperature: 37
Patient temperature: 37
RATE: 18 resp/min
pCO2 arterial: 57 mmHg — ABNORMAL HIGH (ref 32–48)
pCO2 arterial: 49 mmHg — ABNORMAL HIGH (ref 32–48)
pH, Arterial: 7.2 — ABNORMAL LOW (ref 7.35–7.45)
pH, Arterial: 7.28 — ABNORMAL LOW (ref 7.35–7.45)
pO2, Arterial: 130 mmHg — ABNORMAL HIGH (ref 83–108)
pO2, Arterial: 76 mmHg — ABNORMAL LOW (ref 83–108)

## 2022-01-17 LAB — ECHOCARDIOGRAM COMPLETE
AR max vel: 1.68 cm2
AV Peak grad: 13.2 mmHg
Ao pk vel: 1.82 m/s
Area-P 1/2: 2.66 cm2
S' Lateral: 2.7 cm
Single Plane A4C EF: 56 %
Weight: 2673.74 oz

## 2022-01-17 LAB — AMMONIA: Ammonia: 46 umol/L — ABNORMAL HIGH (ref 9–35)

## 2022-01-17 LAB — SARS CORONAVIRUS 2 BY RT PCR: SARS Coronavirus 2 by RT PCR: NEGATIVE

## 2022-01-17 LAB — TROPONIN I (HIGH SENSITIVITY)
Troponin I (High Sensitivity): 62 ng/L — ABNORMAL HIGH (ref <18)
Troponin I (High Sensitivity): 66 ng/L — ABNORMAL HIGH (ref <18)

## 2022-01-17 LAB — COMPREHENSIVE METABOLIC PANEL
ALT: 17 U/L (ref 0–44)
AST: 41 U/L (ref 15–41)
Albumin: 3.3 g/dL — ABNORMAL LOW (ref 3.5–5.0)
Alkaline Phosphatase: 81 U/L (ref 38–126)
Anion gap: 7 (ref 5–15)
BUN: 23 mg/dL (ref 8–23)
CO2: 24 mmol/L (ref 22–32)
Calcium: 9 mg/dL (ref 8.9–10.3)
Chloride: 104 mmol/L (ref 98–111)
Creatinine, Ser: 1.5 mg/dL — ABNORMAL HIGH (ref 0.44–1.00)
GFR, Estimated: 33 mL/min — ABNORMAL LOW (ref >60)
Glucose, Bld: 120 mg/dL — ABNORMAL HIGH (ref 70–99)
Potassium: 4.8 mmol/L (ref 3.5–5.1)
Sodium: 135 mmol/L (ref 135–145)
Total Bilirubin: 1.2 mg/dL (ref 0.3–1.2)
Total Protein: 8.3 g/dL — ABNORMAL HIGH (ref 6.5–8.1)

## 2022-01-17 LAB — CBG MONITORING, ED: Glucose-Capillary: 93 mg/dL (ref 70–99)

## 2022-01-17 LAB — MAGNESIUM: Magnesium: 2.1 mg/dL (ref 1.7–2.4)

## 2022-01-17 LAB — MRSA NEXT GEN BY PCR, NASAL: MRSA by PCR Next Gen: NOT DETECTED

## 2022-01-17 LAB — BRAIN NATRIURETIC PEPTIDE: B Natriuretic Peptide: 1345.3 pg/mL — ABNORMAL HIGH (ref 0.0–100.0)

## 2022-01-17 LAB — PROCALCITONIN: Procalcitonin: 0.21 ng/mL

## 2022-01-17 MED ORDER — VANCOMYCIN HCL 1250 MG/250ML IV SOLN: 1250.0000 mg | INTRAVENOUS | Status: DC

## 2022-01-17 MED ORDER — DOCUSATE SODIUM 50 MG/5ML PO LIQD
100.0000 mg | Freq: Two times a day (BID) | ORAL | Status: DC
  Filled 2022-01-17 (×2): qty 10

## 2022-01-17 MED ORDER — CHLORHEXIDINE GLUCONATE CLOTH 2 % EX PADS
6.0000 | MEDICATED_PAD | Freq: Every day | CUTANEOUS | Status: DC
  Administered 2022-01-17 – 2022-01-31 (×15): 6 via TOPICAL

## 2022-01-17 MED ORDER — FENTANYL CITRATE PF 50 MCG/ML IJ SOSY
25.0000 ug | PREFILLED_SYRINGE | INTRAMUSCULAR | Status: DC | PRN
  Administered 2022-01-17 – 2022-01-18 (×2): 50 ug via INTRAVENOUS
  Administered 2022-01-18: 100 ug via INTRAVENOUS
  Administered 2022-01-18 (×2): 50 ug via INTRAVENOUS
  Filled 2022-01-17: qty 2
  Filled 2022-01-17 (×4): qty 1

## 2022-01-17 MED ORDER — ALBUMIN HUMAN 5 % IV SOLN
25.0000 g | Freq: Once | INTRAVENOUS | Status: AC
  Administered 2022-01-17: 25 g via INTRAVENOUS
  Filled 2022-01-17: qty 500

## 2022-01-17 MED ORDER — SODIUM CHLORIDE 0.9 % IV SOLN
1.0000 g | Freq: Once | INTRAVENOUS | Status: AC
  Administered 2022-01-17: 1 g via INTRAVENOUS
  Filled 2022-01-17: qty 10

## 2022-01-17 MED ORDER — FENTANYL CITRATE PF 50 MCG/ML IJ SOSY
PREFILLED_SYRINGE | INTRAMUSCULAR | Status: AC
  Filled 2022-01-17: qty 1

## 2022-01-17 MED ORDER — IPRATROPIUM-ALBUTEROL 0.5-2.5 (3) MG/3ML IN SOLN
3.0000 mL | RESPIRATORY_TRACT | Status: DC | PRN
  Administered 2022-01-26 – 2022-01-27 (×3): 3 mL via RESPIRATORY_TRACT
  Filled 2022-01-17 (×4): qty 3

## 2022-01-17 MED ORDER — VANCOMYCIN HCL 1750 MG/350ML IV SOLN
1750.0000 mg | Freq: Once | INTRAVENOUS | Status: AC
  Administered 2022-01-17: 1750 mg via INTRAVENOUS
  Filled 2022-01-17: qty 350

## 2022-01-17 MED ORDER — NOREPINEPHRINE 4 MG/250ML-% IV SOLN
0.0000 ug/min | INTRAVENOUS | Status: DC
  Administered 2022-01-17: 12 ug/min via INTRAVENOUS
  Administered 2022-01-17: 4 ug/min via INTRAVENOUS
  Administered 2022-01-17: 3 ug/min via INTRAVENOUS
  Filled 2022-01-17 (×3): qty 250

## 2022-01-17 MED ORDER — ORAL CARE MOUTH RINSE
15.0000 mL | OROMUCOSAL | Status: DC
  Administered 2022-01-17 – 2022-01-21 (×44): 15 mL via OROMUCOSAL
  Filled 2022-01-17 (×2): qty 15

## 2022-01-17 MED ORDER — ACETAMINOPHEN 325 MG PO TABS: 650.0000 mg | ORAL_TABLET | Freq: Four times a day (QID) | ORAL | Status: DC | PRN

## 2022-01-17 MED ORDER — DIAZEPAM 5 MG/ML IJ SOLN
INTRAMUSCULAR | Status: AC
  Filled 2022-01-17: qty 2

## 2022-01-17 MED ORDER — SODIUM CHLORIDE 0.9 % IV SOLN: 250.0000 mL | INTRAVENOUS | Status: DC

## 2022-01-17 MED ORDER — POLYETHYLENE GLYCOL 3350 17 G PO PACK
17.0000 g | PACK | Freq: Every day | ORAL | Status: DC
  Filled 2022-01-17: qty 1

## 2022-01-17 MED ORDER — SODIUM CHLORIDE 0.9 % IV SOLN: 1.0000 g | Freq: Once | INTRAVENOUS | Status: DC

## 2022-01-17 MED ORDER — SODIUM CHLORIDE 0.9 % IV SOLN
500.0000 mg | Freq: Once | INTRAVENOUS | Status: AC
  Administered 2022-01-17: 500 mg via INTRAVENOUS
  Filled 2022-01-17: qty 5

## 2022-01-17 MED ORDER — APIXABAN 2.5 MG PO TABS: 2.5000 mg | ORAL_TABLET | Freq: Two times a day (BID) | ORAL | Status: DC

## 2022-01-17 MED ORDER — DIAZEPAM 5 MG/ML IJ SOLN
2.5000 mg | INTRAMUSCULAR | Status: DC | PRN
  Administered 2022-01-18: 2.5 mg via INTRAVENOUS
  Filled 2022-01-17: qty 2

## 2022-01-17 MED ORDER — LORAZEPAM 2 MG/ML IJ SOLN: 1.0000 mg | Freq: Once | INTRAMUSCULAR | Status: DC

## 2022-01-17 MED ORDER — MIDAZOLAM HCL 2 MG/2ML IJ SOLN
INTRAMUSCULAR | Status: AC
  Filled 2022-01-17: qty 4

## 2022-01-17 MED ORDER — ENOXAPARIN SODIUM 80 MG/0.8ML IJ SOSY
1.0000 mg/kg | PREFILLED_SYRINGE | INTRAMUSCULAR | Status: DC
  Administered 2022-01-17 – 2022-01-18 (×2): 75 mg via SUBCUTANEOUS
  Filled 2022-01-17 (×2): qty 0.8

## 2022-01-17 MED ORDER — DIAZEPAM 5 MG/ML IJ SOLN: 2.5000 mg | Freq: Once | INTRAMUSCULAR | Status: AC

## 2022-01-17 MED ORDER — SODIUM CHLORIDE 0.9% FLUSH: 10.0000 mL | INTRAVENOUS | Status: DC | PRN

## 2022-01-17 MED ORDER — SODIUM CHLORIDE 0.9% FLUSH
10.0000 mL | Freq: Two times a day (BID) | INTRAVENOUS | Status: DC
  Administered 2022-01-17 – 2022-01-30 (×22): 10 mL
  Administered 2022-01-30: 30 mL
  Administered 2022-01-31: 10 mL

## 2022-01-17 MED ORDER — DIAZEPAM 5 MG/ML IJ SOLN
INTRAMUSCULAR | Status: AC
  Administered 2022-01-17: 2.5 mg
  Filled 2022-01-17: qty 2

## 2022-01-17 MED ORDER — PANTOPRAZOLE 2 MG/ML SUSPENSION: 40.0000 mg | Freq: Every day | ORAL | Status: DC

## 2022-01-17 MED ORDER — SODIUM CHLORIDE 0.9 % IV SOLN: INTRAVENOUS | Status: DC | PRN

## 2022-01-17 MED ORDER — SODIUM CHLORIDE 0.9 % IV SOLN: 500.0000 mg | INTRAVENOUS | Status: DC

## 2022-01-17 MED ORDER — DEXTROSE 50 % IV SOLN
INTRAVENOUS | Status: AC
  Administered 2022-01-17: 50 mL
  Filled 2022-01-17: qty 50

## 2022-01-17 MED ORDER — CHLORHEXIDINE GLUCONATE 0.12% ORAL RINSE (MEDLINE KIT)
15.0000 mL | Freq: Two times a day (BID) | OROMUCOSAL | Status: DC
  Administered 2022-01-17 – 2022-01-22 (×11): 15 mL via OROMUCOSAL
  Filled 2022-01-17: qty 15

## 2022-01-17 MED ORDER — SODIUM CHLORIDE 0.9 % IV SOLN: 2.0000 g | INTRAVENOUS | Status: DC

## 2022-01-17 MED ORDER — MIDAZOLAM HCL 5 MG/5ML IJ SOLN
INTRAMUSCULAR | Status: AC | PRN
  Administered 2022-01-17: 4 mg via INTRAVENOUS

## 2022-01-17 MED ORDER — FENTANYL CITRATE PF 50 MCG/ML IJ SOSY
25.0000 ug | PREFILLED_SYRINGE | INTRAMUSCULAR | Status: AC | PRN
  Administered 2022-01-17 – 2022-01-19 (×3): 25 ug via INTRAVENOUS
  Filled 2022-01-17 (×3): qty 1

## 2022-01-17 MED ORDER — VANCOMYCIN HCL 1500 MG/300ML IV SOLN: 1500.0000 mg | INTRAVENOUS | Status: DC

## 2022-01-17 MED ORDER — SODIUM CHLORIDE 0.9 % IV SOLN: INTRAVENOUS | Status: DC

## 2022-01-17 MED ORDER — MIDAZOLAM HCL 2 MG/2ML IJ SOLN
4.0000 mg | Freq: Once | INTRAMUSCULAR | Status: AC
  Administered 2022-01-18: 2 mg via INTRAVENOUS
  Filled 2022-01-17: qty 4

## 2022-01-17 MED ORDER — ACETAMINOPHEN 325 MG RE SUPP: 650.0000 mg | Freq: Four times a day (QID) | RECTAL | Status: DC | PRN

## 2022-01-17 MED ORDER — DEXTROSE 10 % IV SOLN: INTRAVENOUS | Status: DC

## 2022-01-17 MED ORDER — FENTANYL CITRATE (PF) 100 MCG/2ML IJ SOLN
INTRAMUSCULAR | Status: AC | PRN
  Administered 2022-01-17: 50 ug via INTRAVENOUS

## 2022-01-17 MED ORDER — ACETAMINOPHEN 650 MG RE SUPP
650.0000 mg | Freq: Four times a day (QID) | RECTAL | Status: DC | PRN
  Administered 2022-01-20 – 2022-01-28 (×3): 650 mg via RECTAL
  Filled 2022-01-17 (×4): qty 1

## 2022-01-17 MED ORDER — DIAZEPAM 5 MG/ML IJ SOLN
5.0000 mg | INTRAMUSCULAR | Status: AC
  Administered 2022-01-17: 5 mg via INTRAVENOUS

## 2022-01-17 MED ORDER — CHLORDIAZEPOXIDE HCL 25 MG PO CAPS: 25.0000 mg | ORAL_CAPSULE | Freq: Once | ORAL | Status: DC

## 2022-01-17 MED ORDER — PIPERACILLIN-TAZOBACTAM 3.375 G IVPB
3.3750 g | Freq: Three times a day (TID) | INTRAVENOUS | Status: DC
Start: 2022-01-17 — End: 2022-01-18
  Administered 2022-01-17 – 2022-01-18 (×4): 3.375 g via INTRAVENOUS
  Filled 2022-01-17 (×4): qty 50

## 2022-01-17 NOTE — Plan of Care (Signed)
  Problem: Activity: Goal: Ability to tolerate increased activity will improve Outcome: Not Progressing   Problem: Clinical Measurements: Goal: Ability to maintain a body temperature in the normal range will improve Outcome: Not Progressing   Problem: Respiratory: Goal: Ability to maintain adequate ventilation will improve Outcome: Not Progressing Goal: Ability to maintain a clear airway will improve Outcome: Not Progressing   Problem: Education: Goal: Knowledge of General Education information will improve Description: Including pain rating scale, medication(s)/side effects and non-pharmacologic comfort measures Outcome: Not Progressing   Problem: Health Behavior/Discharge Planning: Goal: Ability to manage health-related needs will improve Outcome: Not Progressing   Problem: Clinical Measurements: Goal: Ability to maintain clinical measurements within normal limits will improve Outcome: Not Progressing Goal: Will remain free from infection Outcome: Not Progressing Goal: Diagnostic test results will improve Outcome: Not Progressing Goal: Respiratory complications will improve Outcome: Not Progressing Goal: Cardiovascular complication will be avoided Outcome: Not Progressing   Problem: Activity: Goal: Risk for activity intolerance will decrease Outcome: Not Progressing   Problem: Nutrition: Goal: Adequate nutrition will be maintained Outcome: Not Progressing   Problem: Coping: Goal: Level of anxiety will decrease Outcome: Not Progressing   Problem: Elimination: Goal: Will not experience complications related to bowel motility Outcome: Not Progressing Goal: Will not experience complications related to urinary retention Outcome: Not Progressing   Problem: Pain Managment: Goal: General experience of comfort will improve Outcome: Not Progressing   Problem: Safety: Goal: Ability to remain free from injury will improve Outcome: Not Progressing   Problem: Skin  Integrity: Goal: Risk for impaired skin integrity will decrease Outcome: Not Progressing Patient total care at this time on vent NPO unable to make needs known patient not responding to commands at this time

## 2022-01-17 NOTE — Assessment & Plan Note (Signed)
Judicious narcotic use due to increased somnolence

## 2022-01-17 NOTE — H&P (Signed)
History and Physical    Patient: Kristin [name] MWU:[id number] DOB: 1929/11/11 DOA: 01/16/2022 DOS: the patient was seen and examined on 01/17/2022 PCP: Olin Hauser, DO  Patient coming from: Home  Chief Complaint:  Chief Complaint  Patient presents with   Weakness    HPI: Kristin [name] is a 88 y.o. 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  Son at bedside and contributes to history.  Patient unable to arouse to answer questions ED course and data review: Vitals within normal limits on arrival.  ABG on 4 L shows pH 7.27 with PCO2 of 50 and PO2 of 60.  CBC unremarkable and lactic acid 1.5.  CMP significant for creatinine of 1.5 above baseline of 1.18.  Troponin 62.  COVID-negative.  EKG, personally viewed and interpreted shows sinus rhythm at 64 with no acute ST-T wave changes.  Chest x-ray shows the following: IMPRESSION: Multifocal patchy opacities, progressive when compared to prior. While some of this may reflect tumor in the right upper lobe, superimposed multifocal pneumonia is suspected. Patient given IV fluid bolus, started on Rocephin and azithromycin and hospitalist consulted for admission.   Review of Systems: Patient unarousable Past Medical History:  Diagnosis Date   Acute postoperative pain 02/09/2018   Arrhythmia, sinus node 04/03/2014   Arthritis    Arthritis    Atrial fibrillation (HCC)    Back pain    lower back chronic   Bradycardia    Breast cancer ([place]) 2004   right breast cancer   Cancer ([place]) 2004   rt breast cancer-post lumpectomy- chemo/rad   CHF (congestive heart failure) (HCC)    Chronic back pain    CKD (chronic kidney  disease)    COPD (chronic obstructive pulmonary disease) ([place])    wears O2 at 2L via [place] at night   COVID-19 06/11/2020   Cystocele    Decubitus ulcers    Dehydration    Gout    Hematuria    Hepatitis C    Hyperlipidemia    Hypertension    Hypomagnesemia 06/25/2015   NSTEMI (non-ST elevated myocardial infarction) ([place]) 04/08/2021   OAB (overactive bladder)    Overactive bladder    Overdose opiate, accidental or unintentional, sequela 10/02/2020   Restless leg    Shoulder pain, bilateral    Urinary frequency    UTI (lower urinary tract infection)    Vaginal atrophy    Past Surgical History:  Procedure Laterality Date   ABDOMINAL HYSTERECTOMY     BACK SURGERY     BREAST LUMPECTOMY Right 2004   BREAST SURGERY     rt lumpectomy   CARPAL TUNNEL RELEASE     CATARACT EXTRACTION W/PHACO  10/19/2011   Procedure: CATARACT EXTRACTION PHACO AND INTRAOCULAR LENS PLACEMENT ([place]);  Surgeon: Elta Guadeloupe T. Gershon Crane, MD;  Location: [facility];  Service: Ophthalmology;  Laterality: Right;  CDE:10.81   CATARACT EXTRACTION W/PHACO  11/02/2011   Procedure: CATARACT EXTRACTION PHACO AND INTRAOCULAR LENS PLACEMENT ([facility]);  Surgeon: Elta Guadeloupe T. Gershon Crane, MD;  Location: [facility];  Service: Ophthalmology;  Laterality: Left;  CDE 8.60   CHOLECYSTECTOMY     3/18   COLON SURGERY     INSERT / REPLACE /  REMOVE PACEMAKER     JOINT REPLACEMENT     bilateral TKA   LOWER EXTREMITY ANGIOGRAPHY Left 10/14/2020   Procedure: LOWER EXTREMITY ANGIOGRAPHY;  Surgeon: Katha Cabal, MD;  Location: [facility];  Service: Cardiovascular;  Laterality: Left;   LOWER EXTREMITY ANGIOGRAPHY Right 09/08/2021   Procedure: LOWER EXTREMITY ANGIOGRAPHY;  Surgeon: Katha Cabal, MD;  Location: [facility] [facility];  Service: Cardiovascular;  Laterality: Right;   PACEMAKER INSERTION N/A 12/24/2015   Procedure: INSERTION PACEMAKER;  Surgeon: Isaias Cowman, MD;  Location: [facility];  Service: Cardiovascular;  Laterality: N/A;    ROTATOR CUFF REPAIR     bilateral   VIDEO BRONCHOSCOPY WITH ENDOBRONCHIAL NAVIGATION Right 11/11/2021   Procedure: VIDEO BRONCHOSCOPY WITH ENDOBRONCHIAL NAVIGATION;  Surgeon: Ottie Glazier, MD;  Location: [facility];  Service: Thoracic;  Laterality: Right;   Social History:  reports that she quit smoking about 23 years ago. Her smoking use included cigarettes. She has a 50.00 pack-year smoking history. She has never used smokeless tobacco. She reports current drug use. Drug: Oxycodone. She reports that she does not drink alcohol.  Allergies  Allergen Reactions   Flexeril [Cyclobenzaprine Hcl]     Severe Rash   Vancomycin Rash    Red Mans Syndrome    Family History  Problem Relation Age of Onset   Kidney disease Brother    Heart disease Father    Heart disease Mother    Stroke Mother    Cancer Sister    Breast cancer Sister 40   Breast cancer Paternal Aunt    Anesthesia problems Neg Hx    Hypotension Neg Hx    Malignant hyperthermia Neg Hx    Pseudochol deficiency Neg Hx     Prior to Admission medications   Medication Sig Start Date End Date Taking? Authorizing Provider  acetaminophen (TYLENOL) 500 MG tablet Take 500 mg by mouth in the morning and at bedtime.    [provider]  albuterol (PROVENTIL) (2.5 MG/3ML) 0.083% nebulizer solution USE 1 VIAL IN NEBULIZER EVERY 8 HOURS AS NEEDED FOR WHEEZING OR SHORTNESS OF BREATH. 12/22/21   Karamalegos, Devonne Doughty, DO  apixaban (ELIQUIS) 2.5 MG TABS tablet Take 1 tablet (2.5 mg total) by mouth 2 (two) times daily. 04/09/21   Loletha Grayer, MD  atorvastatin (LIPITOR) 20 MG tablet TAKE 1 TABLET BY MOUTH ONCE DAILY FOR CHOLESTEROL 02/17/21   Karamalegos, Devonne Doughty, DO  bisacodyl (DULCOLAX) 5 MG EC tablet Take 2 tablets (10 mg total) by mouth daily as needed for moderate constipation. 07/04/20   Karamalegos, Alexander J, DO  BREZTRI AEROSPHERE 160-9-4.8 MCG/ACT AERO Inhale 2 puffs into the lungs 2 (two) times daily. 07/07/21    Karamalegos, Devonne Doughty, DO  Cholecalciferol (VITAMIN D) 2000 units tablet Take 2,000 Units by mouth daily.     [provider]  diclofenac Sodium (VOLTAREN) 1 % GEL Apply 4 g topically 2 (two) times daily as needed (As needed for pain). 10/15/21   Kris Hartmann, NP  empagliflozin (JARDIANCE) 10 MG TABS tablet Take 1 tablet (10 mg total) by mouth daily before breakfast. 12/29/21   Alisa Graff, FNP  feeding supplement (ENSURE ENLIVE / ENSURE PLUS) LIQD Take 237 mLs by mouth 2 (two) times daily between meals. 11/13/21   Annita Brod, MD  furosemide (LASIX) 40 MG tablet Take 1 tablet (40 mg total) by mouth daily. 09/23/21   Nicole Kindred A, DO  gabapentin (NEURONTIN) 300 MG capsule Take 1 capsule (300  mg total) by mouth 3 (three) times daily. 11/12/21   Annita Brod, MD  GNP ASPIRIN LOW DOSE 81 MG EC tablet TAKE 1 TABLET BY MOUTH ONCE DAILY. *SWALLOW WHOLE* 08/18/21   Schnier, Dolores Lory, MD  levocetirizine (XYZAL) 5 MG tablet TAKE (1/2) TABLET BY MOUTH EVERY OTHER DAY. 06/19/21   Karamalegos, Devonne Doughty, DO  Melatonin 10 MG TABS Take 10 mg by mouth at bedtime.    [provider]  Multiple Vitamin (MULTIVITAMIN WITH MINERALS) TABS tablet Take 1 tablet by mouth daily. 11/13/21   Annita Brod, MD  oxyCODONE (OXY IR/ROXICODONE) 5 MG immediate release tablet Take 2 tablets (10 mg total) by mouth 2 (two) times daily. May also take 1 tablet (5 mg total) at bedtime as needed for severe pain. Must last 30 days.. 12/23/21 01/22/22  Milinda Pointer, MD  oxyCODONE (OXY IR/ROXICODONE) 5 MG immediate release tablet Take 2 tablets (10 mg total) by mouth 2 (two) times daily. May also take 1 tablet (5 mg total) at bedtime as needed for severe pain. Must last 30 days.. 01/22/22 02/21/22  Milinda Pointer, MD  oxyCODONE (OXY IR/ROXICODONE) 5 MG immediate release tablet Take 2 tablets (10 mg total) by mouth 2 (two) times daily. May also take 1 tablet (5 mg total) at bedtime as needed for  severe pain. Must last 30 days.. 02/21/22 03/23/22  Milinda Pointer, MD  OXYGEN Inhale 2 L into the lungs daily.    [provider]  rOPINIRole (REQUIP) 2 MG tablet TAKE 2 TABLETS BY MOUTH AT BEDTIME 10/21/21   Olin Hauser, DO    Physical Exam: Vitals:   01/16/22 2313  BP: 120/78  Pulse: 60  Resp: 18  Temp: 98.1 F (36.7 C)  SpO2: 99%   Physical Exam Vitals and nursing note reviewed.  Constitutional:      General: She is not in acute distress.    Interventions: Face mask in place.     Comments: Patient  with snoring respirations and difficult to arouse. Son at bedside  HENT:     Head: Normocephalic and atraumatic.  Cardiovascular:     Rate and Rhythm: Normal rate and regular rhythm.     Heart sounds: Normal heart sounds.  Pulmonary:     Effort: Pulmonary effort is normal.     Breath sounds: Normal breath sounds.  Abdominal:     Palpations: Abdomen is soft.     Tenderness: There is no abdominal tenderness.  Neurological:     Mental Status: She is unresponsive.     GCS: GCS eye subscore is 1. GCS verbal subscore is 1. GCS motor subscore is 1.     Data Reviewed: Relevant notes from primary care and specialist visits, past discharge summaries as available in EHR, including Care Everywhere. Prior diagnostic testing as pertinent to current admission diagnoses Updated medications and problem lists for reconciliation ED course, including vitals, labs, imaging, treatment and response to treatment Triage notes, nursing and pharmacy notes and ED provider's notes Notable results as noted in HPI   Assessment and Plan: * Multifocal pneumonia Rocephin and azithromycin Antitussives and supportive care DuoNebs as needed Follow cultures  Acute metabolic encephalopathy/unresponsive Secondary to multifocal pneumonia, hypoxia, opioid use Stat ABG ordered and intensivist consult requested as patient would likely need to be intubated.  Likely unable to protect  airway Son at bedside states patient is a full code  Acute on chronic respiratory failure with hypoxia (HCC) ABG with pH of 7.27 and PO2 60  Intensivist consult requested as patient might be unable to protect airway  Paroxysmal atrial fibrillation (HCC) Currently in sinus rhythm.  Not on rate control agents.  Continue apixaban  Acute renal failure superimposed on stage 3a chronic kidney disease (HCC) IV hydration, monitor renal function and avoid nephrotoxins  Chronic anticoagulation ([facility]) Continue Eliquis  Malignant neoplasm of right upper lobe of lung (HCC) No acute issues suspected  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) Patient on chronic narcotics  Long term current use of opiate analgesic Judicious narcotic use due to increased somnolence  Restless leg Continue ropinirole  COPD (chronic obstructive pulmonary disease) (HCC) Not acutely exacerbated DuoNebs as needed and continue home inhalers  Chronic diastolic CHF (congestive heart failure) (HCC) Not acutely exacerbated We will level Lasix due to mild dehydration and AKI Daily weights       Advance Care Planning:   Code Status: Prior   Consults: none  Family Communication: son at bedside  Severity of Illness: The appropriate patient status for this patient is INPATIENT. Inpatient status is judged to be reasonable and necessary in order to provide the required intensity of service to ensure the patient's safety. The patient's presenting symptoms, physical exam findings, and initial radiographic and laboratory data in the context of their chronic comorbidities is felt to place them at high risk for further clinical deterioration. Furthermore, it is not anticipated that the patient will be medically stable for discharge from the hospital within 2 midnights of admission.   * I certify that at the point of admission it is my clinical judgment that the patient will require inpatient  hospital care spanning beyond 2 midnights from the point of admission due to high intensity of service, high risk for further deterioration and high frequency of surveillance required.*  Author: Athena Masse, MD 01/17/2022 1:13 AM  For on call review [email or web address].

## 2022-01-17 NOTE — Progress Notes (Signed)
PHARMACY - PHYSICIAN COMMUNICATION CRITICAL VALUE ALERT - BLOOD CULTURE IDENTIFICATION (BCID)  Kristin [name] is an 88 y.o. female who presented to [facility] on 01/16/2022 with a chief complaint of PNA  Assessment:  Staph species in 1 of 2 bottles , no resistance detected , most likely a contaminant (include suspected source if known)  Name of physician (or Provider) Contacted: Domingo Pulse Rust-Chester , NP   Current antibiotics: Vanc and Zosyn   Changes to prescribed antibiotics recommended:  Patient is on recommended antibiotics - No changes needed  Results for orders placed or performed during the hospital encounter of 01/16/22  Blood Culture ID Panel (Reflexed) (Collected: 01/17/2022 12:43 AM)  Result Value Ref Range   Enterococcus faecalis NOT DETECTED NOT DETECTED   Enterococcus Faecium NOT DETECTED NOT DETECTED   Listeria monocytogenes NOT DETECTED NOT DETECTED   Staphylococcus species DETECTED (A) NOT DETECTED   Staphylococcus aureus (BCID) NOT DETECTED NOT DETECTED   Staphylococcus epidermidis NOT DETECTED NOT DETECTED   Staphylococcus lugdunensis NOT DETECTED NOT DETECTED   Streptococcus species NOT DETECTED NOT DETECTED   Streptococcus agalactiae NOT DETECTED NOT DETECTED   Streptococcus pneumoniae NOT DETECTED NOT DETECTED   Streptococcus pyogenes NOT DETECTED NOT DETECTED   A.calcoaceticus-baumannii NOT DETECTED NOT DETECTED   Bacteroides fragilis NOT DETECTED NOT DETECTED   Enterobacterales NOT DETECTED NOT DETECTED   Enterobacter cloacae complex NOT DETECTED NOT DETECTED   Escherichia coli NOT DETECTED NOT DETECTED   Klebsiella aerogenes NOT DETECTED NOT DETECTED   Klebsiella oxytoca NOT DETECTED NOT DETECTED   Klebsiella pneumoniae NOT DETECTED NOT DETECTED   Proteus species NOT DETECTED NOT DETECTED   Salmonella species NOT DETECTED NOT DETECTED   Serratia marcescens NOT DETECTED NOT DETECTED   Haemophilus influenzae NOT DETECTED NOT DETECTED   Neisseria  meningitidis NOT DETECTED NOT DETECTED   Pseudomonas aeruginosa NOT DETECTED NOT DETECTED   Stenotrophomonas maltophilia NOT DETECTED NOT DETECTED   Candida albicans NOT DETECTED NOT DETECTED   Candida auris NOT DETECTED NOT DETECTED   Candida glabrata NOT DETECTED NOT DETECTED   Candida krusei NOT DETECTED NOT DETECTED   Candida parapsilosis NOT DETECTED NOT DETECTED   Candida tropicalis NOT DETECTED NOT DETECTED   Cryptococcus neoformans/gattii NOT DETECTED NOT DETECTED    Ayat Drenning D 01/17/2022  10:47 PM

## 2022-01-17 NOTE — Progress Notes (Signed)
Peripherally Inserted Central Catheter Placement  The IV Nurse has discussed with the patient and/or persons authorized to consent for the patient, the purpose of this procedure and the potential benefits and risks involved with this procedure.  The benefits include less needle sticks, lab draws from the catheter, and the patient may be discharged home with the catheter. Risks include, but not limited to, infection, bleeding, blood clot (thrombus formation), and puncture of an artery; nerve damage and irregular heartbeat and possibility to perform a PICC exchange if needed/ordered by physician.  Alternatives to this procedure were also discussed.  Bard Power PICC patient education guide, fact sheet on infection prevention and patient information card has been provided to patient /or left at bedside.  Family at bedside signed consent.  PICC Placement Documentation  PICC Triple Lumen 01/17/22 Right Brachial 38 cm 0 cm (Active)  Indication for Insertion or Continuance of Line Vasoactive infusions;Administration of hyperosmolar/irritating solutions (i.e. TPN, Vancomycin, etc.);Prolonged intravenous therapies 01/17/22 1547  Exposed Catheter (cm) 0 cm 01/17/22 1547  Site Assessment Clean, Dry, Intact 01/17/22 1547  Lumen #1 Status Flushed;Saline locked;Blood return noted 01/17/22 1547  Lumen #2 Status Flushed;Saline locked;Blood return noted 01/17/22 1547  Lumen #3 Status Flushed;Saline locked;Blood return noted 01/17/22 1547  Dressing Type Transparent;Securing device 01/17/22 1547  Dressing Status Antimicrobial disc in place;Clean, Dry, Intact 01/17/22 1547  Safety Lock Not Applicable 93/71/69 6789  Line Care Connections checked and tightened 01/17/22 1547  Line Adjustment (NICU/IV Team Only) No 01/17/22 1547  Dressing Intervention New dressing 01/17/22 1547  Dressing Change Due 01/24/22 01/17/22 1547       Rolena Infante 01/17/2022, 3:48 PM

## 2022-01-17 NOTE — Procedures (Signed)
Intubation Procedure Note  Kristin [name]  [id number]  July 12, 1930  Date:01/17/22  Time:3:59 AM   Provider Performing:[name]    Procedure: Intubation (31500)  Indication(s) Respiratory Failure  Consent Risks of the procedure as well as the alternatives and risks of each were explained to the patient and/or caregiver.  Consent for the procedure was obtained and is signed in the bedside chart   Anesthesia Versed and Fentanyl   Time Out Verified patient identification, verified procedure, site/side was marked, verified correct patient position, special equipment/implants available, medications/allergies/relevant history reviewed, required imaging and test results available.   Sterile Technique Usual hand hygeine, masks, and gloves were used   Procedure Description Patient positioned in bed supine.  Sedation given as noted above.  Patient was intubated with endotracheal tube using Glidescope.  View was Grade 1 full glottis .  Number of attempts was 1.  Colorimetric CO2 detector was consistent with tracheal placement.   Complications/Tolerance None; patient tolerated the procedure well. Chest X-ray is ordered to verify placement.   EBL Minimal   Specimen(s) None   Venetia Night, AGACNP-BC Acute Care Nurse Practitioner [facility]   [phone] / [phone] Please see Amion for pager details.

## 2022-01-17 NOTE — Assessment & Plan Note (Signed)
Not acutely exacerbated DuoNebs as needed and continue home inhalers

## 2022-01-17 NOTE — Progress Notes (Signed)
Pharmacy Antibiotic Note  Kristin [name] is a 88 y.o. female admitted on 01/16/2022 with pneumonia.  Pharmacy has been consulted for Zosyn, Vancomycin  dosing.  Pt has red man syndrome  to vanc so will double infusion times to avoid this rxn.   Plan: Zosyn 3.375 gm IV Q8H EI ordered to start on 5/28 @ 0600.  Vancomycin 1750 mg IV X 1 ordered for on 5/28 @ 0600.  Vancomycin 1250 mg IV Q48H ordered to start on 5/30 @ 0600.  AUC = 502.9 Vanc trough = 11.4   Weight: 75.8 kg (167 lb 1.7 oz)  Temp (24hrs), Avg:98.1 F (36.7 C), Min:98.1 F (36.7 C), Max:98.1 F (36.7 C)  Recent Labs  Lab 01/16/22 2342 01/16/22 2343  WBC 4.4  --   CREATININE 1.50*  --   LATICACIDVEN  --  1.5    Estimated Creatinine Clearance: 26.5 mL/min (A) (by C-G formula based on SCr of 1.5 mg/dL (H)).    Allergies  Allergen Reactions   Flexeril [Cyclobenzaprine Hcl]     Severe Rash   Vancomycin Rash    Red Mans Syndrome    Antimicrobials this admission:   >>    >>   Dose adjustments this admission:   Microbiology results:  BCx:   UCx:    Sputum:    MRSA PCR:   Thank you for allowing pharmacy to be a part of this patient's care.  Koen Antilla D 01/17/2022 5:10 AM

## 2022-01-17 NOTE — Assessment & Plan Note (Signed)
Not acutely exacerbated We will level Lasix due to mild dehydration and AKI Daily weights

## 2022-01-17 NOTE — Assessment & Plan Note (Signed)
-   Continue Eliquis 

## 2022-01-17 NOTE — Consult Note (Signed)
NAME:  Kristin [name], MRN:  [id number], DOB:  07-21-1930, LOS: 0 ADMISSION DATE:  01/16/2022, CONSULTATION DATE: 01/17/2022 REFERRING MD: Dr. Damita Dunnings, CHIEF COMPLAINT: Weakness  History of Present Illness:  88 year old female presenting to [facility] ED from home via EMS on 01/16/2022 with complaints of lethargy and weakness.  Per the patient's granddaughter bedside, Kristin [name], patient was in her normal state of health until earlier in the evening on 01/16/2022 when she picked up the phone but did not respond.  Patient's granddaughter lives next door and walked over to check on her finding her sitting at the kitchen table with her head on the table.  She was responsive and able to talk but very weak.  Only other thing family noticed was that the patient was sleeping more than usual.  No other complaints that family was aware of. ED course: Upon arrival to ED, patient is documented complaining of a cough.  But she denied chest pain/abdominal pain/nausea/vomiting and dizziness.  Patient worked up per sepsis protocol.  Chest x-ray concerning for multifocal pneumonia.  Other lab work revealed no leukocytosis, AKI, mildly elevated troponin with an unremarkable lactic acid and negative COVID swab.  TRH consulted for admission initially. medications given: Azithromycin & ceftriaxone, 500 mL bolus Initial Vitals: Afebrile at 98.1, RR 18, NSR 60, BP 120/78 and SPO2 99% on 4 L nasal cannula Significant labs: (Labs/ Imaging personally reviewed) I, Domingo Pulse [place], AGACNP-BC, personally viewed and interpreted this ECG. EKG Interpretation: Date: 01/16/22, EKG Time: 23:30, Rate: 64, Rhythm: PPM in place: RBBB/LAFB, QRS Axis:  LAD Intervals: RBBB/ LAFB, ST/T Wave abnormalities: V2 ST depression & non specific T wave inversions, Narrative Interpretation: NSR with RBBB/LAFB Chemistry: Na+: 135, K+: 4.8, BUN/Cr.: 23/1.50, Serum CO2/ AG: 24/7 Hematology: WBC: 4.4, Hgb: 12.0, plt: 180,  Troponin: 62 > 66, BNP: pending,  Lactic/ PCT: 1.5/ pending, COVID-19 & Influenza A/B: negative ABG: 7.27/ 50/ 60/ 23 CXR 01/16/2022: Multifocal patchy opacities progressive when compared to prior CT head without contrast 01/17/2022: Pending  PCCM consulted for admission due to acute respiratory failure secondary to aspiration in the setting of acute encephalopathy requiring emergent intubation and mechanical ventilatory support.  Pertinent  Medical History  Atrial fibrillation on Eliquis Right breast cancer 2004 HFpEF COPD COVID-19 06/11/2020 Gout Hepatitis C Hyperlipidemia Hypertension NSTEMI 04/08/2021 SCC lung status post palliative radiation SBRT x5 fractions PPM in place Significant Hospital Events: Including procedures, antibiotic start and stop dates in addition to other pertinent events   01/17/2022: Admit to ICU with acute hypoxic respiratory failure secondary to suspected aspiration in the setting of acute encephalopathy  Interim History / Subjective:  Patient unresponsive, RASS -5 to sternal rub and painful stimuli.  Positive corneals, gag and cough. Patient's oxygenation stabilized on a nonrebreather, but respirations sound gurgling. Decision made to complete rapid sequence intubation and provide mechanical ventilatory support  Objective   Blood pressure (!) 86/46, pulse 64, temperature 98.1 F (36.7 C), resp. rate (!) 21, SpO2 100 %.    Vent Mode: AC FiO2 (%):  [60 %] 60 % Set Rate:  [18 bmp] 18 bmp Vt Set:  [400 mL] 400 mL PEEP:  [5 cmH20] 5 cmH20  No intake or output data in the 24 hours ending 01/17/22 0400 There were no vitals filed for this visit.  Examination: General: Adult female, critically ill, lying in bed, unresponsive, NAD HEENT: MM pink/moist, anicteric, atraumatic, neck supple Neuro: Unresponsive, RASS -5, unable to follow commands, PERRL +2, no movement to painful stimuli in  any extremity CV: s1s2 RRR, NSR paced rhythm on monitor, no r/m/g Pulm: Regular, non labored on NRB 15 L,  breath sounds coarse crackles-BUL & diminished-BLL GI: soft, rounded, non tender, bs x 4 GU: foley in place with clear yellow urine Skin: Limited exam- no rashes/lesions noted Extremities: warm/dry, pulses + 2 R/P, +2 edema noted BLE  Resolved Hospital Problem list     Assessment & Plan:  Acute Hypoxic / Hypercapnic Respiratory Failure secondary to suspected aspiration in the setting of possible CAP & acute HFpEF exacerbation  - Ventilator settings: PRVC  8 mL/kg, 60% FiO2, 5 PEEP, continue ventilator support & lung protective strategies - Wean PEEP & FiO2 as tolerated, maintain SpO2 > 90% - Head of bed elevated 30 degrees, VAP protocol in place - Plateau pressures less than 30 cm H20  - Intermittent chest x-ray & ABG PRN - Daily WUA with SBT as tolerated  - Ensure adequate pulmonary hygiene  - F/u cultures, trend PCT -Broadened CAP/aspiration coverage: Zosyn & vancomycin - bronchodilators PRN - PAD protocol in place: continue Fentanyl IVP   Suspected acute on Chronic HFpEF exacerbation Elevated Troponin secondary to suspected demand ischemia versus NSTEMI Chronic / Paroxysmal Atrial Fibrillation Troponin: 62 BNP: Pending - Trend troponins  - Echocardiogram ordered - f/u BNP - Continuous cardiac monitoring  - Daily weights to assess volume status - Diurese with the use of IV lasix, as hemodynamics and renal function permit -Continue outpatient Eliquis  Acute Kidney Injury superimposed on CKD stage 3b  Baseline Cr: 1.18, Cr on admission:1.50 - Strict I/O's: alert provider if UOP < 0.5 mL/kg/hr - Daily BMP, replace electrolytes PRN - Avoid nephrotoxic agents as able, ensure adequate renal perfusion  Acute encephalopathy secondary to unknown etiology -Stat CT head without contrast - f/u ammonia level -Consider EEG/MRI brain if mentation does not improve with oxygenation support -Neurochecks every 4 -Avoid sedating medications as able - supportive care  Best Practice  (right click and "Reselect all SmartList Selections" daily)  Diet/type: NPO w/ meds via tube DVT prophylaxis: DOAC GI prophylaxis: PPI Lines: N/A Foley:  Yes, and it is still needed Code Status:  full code Last date of multidisciplinary goals of care discussion [01/16/22] Updated granddaughter, HCPOA, via telephone.  Questions and concerns answered at this time.  Labs   CBC: Recent Labs  Lab 01/16/22 2342  WBC 4.4  NEUTROABS 3.0  HGB 12.0  HCT 40.5  MCV 90.4  PLT 528    Basic Metabolic Panel: Recent Labs  Lab 01/16/22 2342  NA 135  K 4.8  CL 104  CO2 24  GLUCOSE 120*  BUN 23  CREATININE 1.50*  CALCIUM 9.0   GFR: CrCl cannot be calculated (Unknown ideal weight.). Recent Labs  Lab 01/16/22 2342 01/16/22 2343  WBC 4.4  --   LATICACIDVEN  --  1.5    Liver Function Tests: Recent Labs  Lab 01/16/22 2342  AST 41  ALT 17  ALKPHOS 81  BILITOT 1.2  PROT 8.3*  ALBUMIN 3.3*   No results for input(s): LIPASE, AMYLASE in the last 168 hours. No results for input(s): AMMONIA in the last 168 hours.  ABG    Component Value Date/Time   PHART 7.27 (L) 01/17/2022 0018   PCO2ART 50 (H) 01/17/2022 0018   PO2ART 60 (L) 01/17/2022 0018   HCO3 23.0 01/17/2022 0018   ACIDBASEDEF 4.3 (H) 01/17/2022 0018   O2SAT 87.8 01/17/2022 0018     Coagulation Profile: No results for input(s): INR, PROTIME  in the last 168 hours.  Cardiac Enzymes: No results for input(s): CKTOTAL, CKMB, CKMBINDEX, TROPONINI in the last 168 hours.  HbA1C: Hgb A1c MFr Bld  Date/Time Value Ref Range Status  11/05/2021 02:03 PM 6.1 (H) 4.8 - 5.6 % Final    Comment:    (NOTE)         Prediabetes: 5.7 - 6.4         Diabetes: >6.4         Glycemic control for adults with diabetes: <7.0   04/01/2020 08:07 AM 5.5 <5.7 % of total Hgb Final    Comment:    For the purpose of screening for the presence of diabetes: . <5.7%       Consistent with the absence of diabetes 5.7-6.4%    Consistent with  increased risk for diabetes             (prediabetes) > or =6.5%  Consistent with diabetes . This assay result is consistent with a decreased risk of diabetes. . Currently, no consensus exists regarding use of hemoglobin A1c for diagnosis of diabetes in children. . According to American Diabetes Association (ADA) guidelines, hemoglobin A1c <7.0% represents optimal control in non-pregnant diabetic patients. Different metrics may apply to specific patient populations.  Standards of Medical Care in Diabetes(ADA). .     CBG: Recent Labs  Lab 01/17/22 0326  GLUCAP 93    Review of Systems:   UTA patient unresponsive unable to participate in interview  Past Medical History:  She,  has a past medical history of Acute postoperative pain (02/09/2018), Arrhythmia, sinus node (04/03/2014), Arthritis, Arthritis, Atrial fibrillation ([place]), Back pain, Bradycardia, Breast cancer ([place]) (2004), Cancer ([place]) (2004), CHF (congestive heart failure) ([place]), Chronic back pain, CKD (chronic kidney disease), COPD (chronic obstructive pulmonary disease) ([place]), COVID-19 (06/11/2020), Cystocele, Decubitus ulcers, Dehydration, Gout, Hematuria, Hepatitis C, Hyperlipidemia, Hypertension, Hypomagnesemia (06/25/2015), NSTEMI (non-ST elevated myocardial infarction) ([place]) (04/08/2021), OAB (overactive bladder), Overactive bladder, Overdose opiate, accidental or unintentional, sequela (10/02/2020), Restless leg, Shoulder pain, bilateral, Urinary frequency, UTI (lower urinary tract infection), and Vaginal atrophy.   Surgical History:   Past Surgical History:  Procedure Laterality Date   ABDOMINAL HYSTERECTOMY     BACK SURGERY     BREAST LUMPECTOMY Right 2004   BREAST SURGERY     rt lumpectomy   CARPAL TUNNEL RELEASE     CATARACT EXTRACTION W/PHACO  10/19/2011   Procedure: CATARACT EXTRACTION PHACO AND INTRAOCULAR LENS PLACEMENT (IOC);  Surgeon: Elta Guadeloupe T. Gershon Crane, MD;  Location: [facility];  Service: Ophthalmology;   Laterality: Right;  CDE:10.81   CATARACT EXTRACTION W/PHACO  11/02/2011   Procedure: CATARACT EXTRACTION PHACO AND INTRAOCULAR LENS PLACEMENT (IOC);  Surgeon: Elta Guadeloupe T. Gershon Crane, MD;  Location: [facility];  Service: Ophthalmology;  Laterality: Left;  CDE 8.60   CHOLECYSTECTOMY     3/18   COLON SURGERY     INSERT / REPLACE / REMOVE PACEMAKER     JOINT REPLACEMENT     bilateral TKA   LOWER EXTREMITY ANGIOGRAPHY Left 10/14/2020   Procedure: LOWER EXTREMITY ANGIOGRAPHY;  Surgeon: Katha Cabal, MD;  Location: [place] [facility];  Service: Cardiovascular;  Laterality: Left;   LOWER EXTREMITY ANGIOGRAPHY Right 09/08/2021   Procedure: LOWER EXTREMITY ANGIOGRAPHY;  Surgeon: Katha Cabal, MD;  Location: [place] [facility];  Service: Cardiovascular;  Laterality: Right;   PACEMAKER INSERTION N/A 12/24/2015   Procedure: INSERTION PACEMAKER;  Surgeon: Isaias Cowman, MD;  Location: [facility];  Service: Cardiovascular;  Laterality:  N/A;   ROTATOR CUFF REPAIR     bilateral   VIDEO BRONCHOSCOPY WITH ENDOBRONCHIAL NAVIGATION Right 11/11/2021   Procedure: VIDEO BRONCHOSCOPY WITH ENDOBRONCHIAL NAVIGATION;  Surgeon: Ottie Glazier, MD;  Location: [facility];  Service: Thoracic;  Laterality: Right;     Social History:   reports that she quit smoking about 23 years ago. Her smoking use included cigarettes. She has a 50.00 pack-year smoking history. She has never used smokeless tobacco. She reports current drug use. Drug: Oxycodone. She reports that she does not drink alcohol.   Family History:  Her family history includes Breast cancer in her paternal aunt; Breast cancer (age of onset: 67) in her sister; Cancer in her sister; Heart disease in her father and mother; Kidney disease in her brother; Stroke in her mother. There is no history of Anesthesia problems, Hypotension, Malignant hyperthermia, or Pseudochol deficiency.   Allergies Allergies  Allergen Reactions   Flexeril [Cyclobenzaprine Hcl]      Severe Rash   Vancomycin Rash    Red Mans Syndrome     Home Medications  Prior to Admission medications   Medication Sig Start Date End Date Taking? Authorizing Provider  acetaminophen (TYLENOL) 500 MG tablet Take 500 mg by mouth in the morning and at bedtime.   Yes [provider]  apixaban (ELIQUIS) 2.5 MG TABS tablet Take 1 tablet (2.5 mg total) by mouth 2 (two) times daily. 04/09/21  Yes Wieting, [name], MD  atorvastatin (LIPITOR) 20 MG tablet TAKE 1 TABLET BY MOUTH ONCE DAILY FOR CHOLESTEROL Patient taking differently: Take 20 mg by mouth daily. 02/17/21  Yes Karamalegos, Alexander J, DO  BREZTRI AEROSPHERE 160-9-4.8 MCG/ACT AERO Inhale 2 puffs into the lungs 2 (two) times daily. 07/07/21  Yes Karamalegos, Devonne Doughty, DO  Cholecalciferol (VITAMIN D) 2000 units tablet Take 2,000 Units by mouth daily.    Yes [provider]  empagliflozin (JARDIANCE) 10 MG TABS tablet Take 1 tablet (10 mg total) by mouth daily before breakfast. 12/29/21  Yes [name] A, FNP  furosemide (LASIX) 40 MG tablet Take 1 tablet (40 mg total) by mouth daily. 09/23/21  Yes Nicole Kindred A, DO  gabapentin (NEURONTIN) 300 MG capsule Take 1 capsule (300 mg total) by mouth 3 (three) times daily. 11/12/21  Yes Annita Brod, MD  GNP ASPIRIN LOW DOSE 81 MG EC tablet TAKE 1 TABLET BY MOUTH ONCE DAILY. *SWALLOW WHOLE* 08/18/21  Yes Schnier, Dolores Lory, MD  levocetirizine (XYZAL) 5 MG tablet TAKE (1/2) TABLET BY MOUTH EVERY OTHER DAY. 06/19/21  Yes Karamalegos, Devonne Doughty, DO  Melatonin 10 MG TABS Take 10 mg by mouth at bedtime.   Yes [provider]  Multiple Vitamin (MULTIVITAMIN WITH MINERALS) TABS tablet Take 1 tablet by mouth daily. 11/13/21  Yes Annita Brod, MD  oxyCODONE (OXY IR/ROXICODONE) 5 MG immediate release tablet Take 2 tablets (10 mg total) by mouth 2 (two) times daily. May also take 1 tablet (5 mg total) at bedtime as needed for severe pain. Must last 30 days.. 12/23/21  01/22/22 Yes Milinda Pointer, MD  rOPINIRole (REQUIP) 2 MG tablet TAKE 2 TABLETS BY MOUTH AT BEDTIME 10/21/21  Yes Karamalegos, Alexander J, DO  albuterol (PROVENTIL) (2.5 MG/3ML) 0.083% nebulizer solution USE 1 VIAL IN NEBULIZER EVERY 8 HOURS AS NEEDED FOR WHEEZING OR SHORTNESS OF BREATH. 12/22/21   Karamalegos, Devonne Doughty, DO  bisacodyl (DULCOLAX) 5 MG EC tablet Take 2 tablets (10 mg total) by mouth daily as needed for moderate constipation. 07/04/20  Karamalegos, Devonne Doughty, DO  diclofenac Sodium (VOLTAREN) 1 % GEL Apply 4 g topically 2 (two) times daily as needed (As needed for pain). 10/15/21   Kris Hartmann, NP  feeding supplement (ENSURE ENLIVE / ENSURE PLUS) LIQD Take 237 mLs by mouth 2 (two) times daily between meals. 11/13/21   Annita Brod, MD  oxyCODONE (OXY IR/ROXICODONE) 5 MG immediate release tablet Take 2 tablets (10 mg total) by mouth 2 (two) times daily. May also take 1 tablet (5 mg total) at bedtime as needed for severe pain. Must last 30 days.. 01/22/22 02/21/22  Milinda Pointer, MD  oxyCODONE (OXY IR/ROXICODONE) 5 MG immediate release tablet Take 2 tablets (10 mg total) by mouth 2 (two) times daily. May also take 1 tablet (5 mg total) at bedtime as needed for severe pain. Must last 30 days.. 02/21/22 03/23/22  Milinda Pointer, MD  OXYGEN Inhale 2 L into the lungs daily.    [provider]     Critical care time: 68 minutes      Venetia Night, AGACNP-BC Acute Care Nurse Practitioner [facility]   [phone] / [phone] Please see Amion for pager details.

## 2022-01-17 NOTE — ED Notes (Signed)
Pt placed on NRB after sats  down to 77% on 4 liters , admitting notified

## 2022-01-17 NOTE — Progress Notes (Signed)
Pharmacy Antibiotic Note  Kristin [name] is a 88 y.o. female admitted on 01/16/2022 with pneumonia.  Pharmacy has been consulted for Zosyn, Vancomycin  dosing.  Pt has red man syndrome  to vanc so will double infusion times to avoid this rxn.   Plan: Zosyn 3.375 gm IV Q8H EI ordered to start on 5/28 @ 0600.  Vancomycin 1500 mg IV Q48H ordered to start on 5/30 @ 0600.  AUC = 510.4 Vanc trough = 10.8  Weight: 75.8 kg (167 lb 1.7 oz)  Temp (24hrs), Avg:99.6 F (37.6 C), Min:98.1 F (36.7 C), Max:100.7 F (38.2 C)  Recent Labs  Lab 01/16/22 2342 01/16/22 2343 01/17/22 0549  WBC 4.4  --  6.7  CREATININE 1.50*  --  1.34*  LATICACIDVEN  --  1.5  --      Estimated Creatinine Clearance: 29.6 mL/min (A) (by C-G formula based on SCr of 1.34 mg/dL (H)).    Allergies  Allergen Reactions   Flexeril [Cyclobenzaprine Hcl]     Severe Rash   Vancomycin Rash    Red Mans Syndrome    Antimicrobials this admission: Vanc/Zosyn 5/28  >>  CRO x1  Dose adjustments this admission:   Microbiology results:  BCx: pending  UCx:  sent  MRSA PCR: negative  Thank you for allowing pharmacy to be a part of this patient's care.  Malonie Tatum A Kayanna Mckillop 01/17/2022 10:46 AM

## 2022-01-17 NOTE — Progress Notes (Addendum)
Patient found to be agitated, BL arms shaking, reaching for tube and biting tube. Patient unable to follow commands. PRN fentanyl 50 mcg given with no changes noted. NP called to bedside, verbal order for diazepam 2.5 mg IV once obtained and given with good effect noted. The patients son, [name] is present at the bedside and states that he will call and update the family. BL Safety mitts placed back on patient.

## 2022-01-17 NOTE — Assessment & Plan Note (Signed)
No acute issues suspected 

## 2022-01-17 NOTE — Assessment & Plan Note (Addendum)
Secondary to multifocal pneumonia, hypoxia, opioid use Stat ABG ordered and intensivist consult requested as patient would likely need to be intubated.  Likely unable to protect airway Son at bedside states patient is a full code

## 2022-01-17 NOTE — Assessment & Plan Note (Signed)
Patient on chronic narcotics

## 2022-01-17 NOTE — Assessment & Plan Note (Signed)
IV hydration, monitor renal function and avoid nephrotoxins

## 2022-01-17 NOTE — Progress Notes (Signed)
eLink Physician-Brief Progress Note Patient Name: Kristin [name] DOB: 1930-02-19 MRN: [id number]   Date of Service  01/17/2022  HPI/Events of Note  88 yr old woman with multiple medical problems now in ICU with acute resp failure needing intubation. No overt distress while on PRVC 60%/peep 5/RR 18/VT 450. Ct head negative. PCCM consulted and admitting.   eICU Interventions  Consider broadening spectrum of antibiotics given underlying illness Chronically anticoagulated Follow CXR, ABG and replace and monitor electrolytes as needed Call E link if needed     Intervention Category Major Interventions: Respiratory failure - evaluation and management Evaluation Type: New Patient Evaluation  Margaretmary Lombard 01/17/2022, 4:40 AM

## 2022-01-17 NOTE — Assessment & Plan Note (Signed)
Rocephin and azithromycin Antitussives and supportive care DuoNebs as needed Follow cultures

## 2022-01-17 NOTE — Assessment & Plan Note (Signed)
Continue ropinirole 

## 2022-01-17 NOTE — Progress Notes (Signed)
PROGRESS NOTE  RV dilatation  Will need CTA of the chest once creatinine resolves Maintain full strength LMWH adjusted for ongoing renal function  //Tevis Conger

## 2022-01-17 NOTE — Progress Notes (Signed)
*  PRELIMINARY RESULTS* Echocardiogram 2D Echocardiogram has been performed.  Kristin [name] 01/17/2022, 3:44 PM

## 2022-01-17 NOTE — Assessment & Plan Note (Addendum)
ABG with pH of 7.27 and PO2 60 Intensivist consult requested as patient might be unable to protect airway

## 2022-01-17 NOTE — Assessment & Plan Note (Signed)
Currently in sinus rhythm.  Not on rate control agents.  Continue apixaban

## 2022-01-18 ENCOUNTER — Inpatient Hospital Stay: Payer: Medicare HMO

## 2022-01-18 DIAGNOSIS — E440 Moderate protein-calorie malnutrition: Secondary | ICD-10-CM | POA: Insufficient documentation

## 2022-01-18 DIAGNOSIS — G9341 Metabolic encephalopathy: Secondary | ICD-10-CM | POA: Diagnosis not present

## 2022-01-18 DIAGNOSIS — R569 Unspecified convulsions: Secondary | ICD-10-CM | POA: Diagnosis not present

## 2022-01-18 DIAGNOSIS — J189 Pneumonia, unspecified organism: Secondary | ICD-10-CM | POA: Diagnosis not present

## 2022-01-18 LAB — GLUCOSE, CAPILLARY
Glucose-Capillary: 82 mg/dL (ref 70–99)
Glucose-Capillary: 135 mg/dL — ABNORMAL HIGH (ref 70–99)
Glucose-Capillary: 123 mg/dL — ABNORMAL HIGH (ref 70–99)
Glucose-Capillary: 116 mg/dL — ABNORMAL HIGH (ref 70–99)
Glucose-Capillary: 113 mg/dL — ABNORMAL HIGH (ref 70–99)
Glucose-Capillary: 103 mg/dL — ABNORMAL HIGH (ref 70–99)
Glucose-Capillary: 133 mg/dL — ABNORMAL HIGH (ref 70–99)

## 2022-01-18 LAB — PROCALCITONIN: Procalcitonin: 1.07 ng/mL

## 2022-01-18 LAB — CBC
HCT: 40.4 % (ref 36.0–46.0)
Hemoglobin: 12.1 g/dL (ref 12.0–15.0)
MCH: 27.3 pg (ref 26.0–34.0)
MCHC: 30 g/dL (ref 30.0–36.0)
MCV: 91.2 fL (ref 80.0–100.0)
Platelets: 194 10*3/uL (ref 150–400)
RBC: 4.43 MIL/uL (ref 3.87–5.11)
RDW: 18.6 % — ABNORMAL HIGH (ref 11.5–15.5)
WBC: 8.7 10*3/uL (ref 4.0–10.5)
nRBC: 0 % (ref 0.0–0.2)

## 2022-01-18 LAB — BASIC METABOLIC PANEL
Anion gap: 9 (ref 5–15)
BUN: 18 mg/dL (ref 8–23)
CO2: 23 mmol/L (ref 22–32)
Calcium: 8.8 mg/dL — ABNORMAL LOW (ref 8.9–10.3)
Chloride: 104 mmol/L (ref 98–111)
Creatinine, Ser: 1.36 mg/dL — ABNORMAL HIGH (ref 0.44–1.00)
GFR, Estimated: 37 mL/min — ABNORMAL LOW (ref >60)
Glucose, Bld: 137 mg/dL — ABNORMAL HIGH (ref 70–99)
Potassium: 4 mmol/L (ref 3.5–5.1)
Sodium: 136 mmol/L (ref 135–145)

## 2022-01-18 LAB — TROPONIN I (HIGH SENSITIVITY): Troponin I (High Sensitivity): 53 ng/L — ABNORMAL HIGH (ref <18)

## 2022-01-18 LAB — PHOSPHORUS: Phosphorus: 2.9 mg/dL (ref 2.5–4.6)

## 2022-01-18 LAB — MAGNESIUM: Magnesium: 2.1 mg/dL (ref 1.7–2.4)

## 2022-01-18 MED ORDER — SODIUM CHLORIDE 0.9 % IV SOLN
3.0000 g | Freq: Two times a day (BID) | INTRAVENOUS | Status: DC
  Administered 2022-01-18 – 2022-01-19 (×2): 3 g via INTRAVENOUS
  Filled 2022-01-18 (×2): qty 8
  Filled 2022-01-18: qty 3

## 2022-01-18 MED ORDER — PROPOFOL 1000 MG/100ML IV EMUL
INTRAVENOUS | Status: AC
  Administered 2022-01-18: 5 ug/kg/min via INTRAVENOUS
  Filled 2022-01-18: qty 100

## 2022-01-18 MED ORDER — LEVETIRACETAM IN NACL 500 MG/100ML IV SOLN
500.0000 mg | Freq: Two times a day (BID) | INTRAVENOUS | Status: DC
  Administered 2022-01-18 – 2022-01-19 (×2): 500 mg via INTRAVENOUS
  Filled 2022-01-18 (×2): qty 100

## 2022-01-18 MED ORDER — LEVETIRACETAM IN NACL 1000 MG/100ML IV SOLN
1000.0000 mg | Freq: Once | INTRAVENOUS | Status: AC
  Administered 2022-01-18: 1000 mg via INTRAVENOUS
  Filled 2022-01-18: qty 100

## 2022-01-18 MED ORDER — PROPOFOL 1000 MG/100ML IV EMUL
5.0000 ug/kg/min | INTRAVENOUS | Status: DC
  Administered 2022-01-18: 10 ug/kg/min via INTRAVENOUS
  Filled 2022-01-18: qty 100

## 2022-01-18 MED ORDER — DIAZEPAM 2 MG PO TABS
1.0000 mg | ORAL_TABLET | Freq: Four times a day (QID) | ORAL | Status: DC
Start: 2022-01-18 — End: 2022-01-19

## 2022-01-18 MED ORDER — MIDAZOLAM BOLUS VIA INFUSION: 1.0000 mg | INTRAVENOUS | Status: DC | PRN

## 2022-01-18 MED ORDER — NOREPINEPHRINE 16 MG/250ML-% IV SOLN
0.0000 ug/min | INTRAVENOUS | Status: DC
  Administered 2022-01-18: 14 ug/min via INTRAVENOUS
  Administered 2022-01-19: 11 ug/min via INTRAVENOUS
  Filled 2022-01-18 (×2): qty 250

## 2022-01-18 MED ORDER — DEXMEDETOMIDINE HCL IN NACL 400 MCG/100ML IV SOLN
0.4000 ug/kg/h | INTRAVENOUS | Status: DC
  Filled 2022-01-18: qty 100

## 2022-01-18 MED ORDER — PROPOFOL 1000 MG/100ML IV EMUL
5.0000 ug/kg/min | INTRAVENOUS | Status: DC
  Administered 2022-01-19: 25 ug/kg/min via INTRAVENOUS
  Filled 2022-01-18: qty 100

## 2022-01-18 MED ORDER — MIDAZOLAM-SODIUM CHLORIDE 100-0.9 MG/100ML-% IV SOLN
0.5000 mg/h | INTRAVENOUS | Status: DC
  Administered 2022-01-18: 1 mg/h via INTRAVENOUS
  Filled 2022-01-18: qty 100

## 2022-01-18 MED ORDER — LORAZEPAM 2 MG/ML IJ SOLN: 1.0000 mg | INTRAMUSCULAR | Status: DC | PRN

## 2022-01-18 NOTE — Progress Notes (Signed)
NAME:  Kristin [name], MRN:  [id number], DOB:  [birth year], LOS: 1 ADMISSION DATE:  01/16/2022, CONSULTATION DATE: 01/17/2022 REFERRING MD: Dr. Damita Dunnings, CHIEF COMPLAINT: Weakness  History of Present Illness:  88 year old female presenting to [facility] ED from home via EMS on 01/16/2022 with complaints of lethargy and weakness.  Per the patient's granddaughter bedside, [name], patient was in her normal state of health until earlier in the evening on 01/16/2022 when she picked up the phone but did not respond.  Patient's granddaughter lives next door and walked over to check on her finding her sitting at the kitchen table with her head on the table.  She was responsive and able to talk but very weak.  Only other thing family noticed was that the patient was sleeping more than usual.  No other complaints that family was aware of.  01/18/22- patient was unable to get NGT advaced by RN staff after multiple attempts. We will try again with direct visual utilzing GLideScope.  Patient is making attempts at opening eyes and having spontaneous non jerky movements. Family in room we met and reivewed medical plan.   Pertinent  Medical History  Atrial fibrillation on Eliquis Right breast cancer 2004 HFpEF COPD COVID-19 06/11/2020 Gout Hepatitis C Hyperlipidemia Hypertension NSTEMI 04/08/2021 SCC lung status post palliative radiation SBRT x5 fractions PPM in place Significant Hospital Events: Including procedures, antibiotic start and stop dates in addition to other pertinent events   01/17/2022: Admit to ICU with acute hypoxic respiratory failure secondary to suspected aspiration in the setting of acute encephalopathy  Interim History / Subjective:  Patient unresponsive, RASS -5 to sternal rub and painful stimuli.  Positive corneals, gag and cough. Patient's oxygenation stabilized on a nonrebreather, but respirations sound gurgling. Decision made to complete rapid sequence intubation and provide mechanical  ventilatory support  Objective   Blood pressure (!) 102/59, pulse 63, temperature 98.4 F (36.9 C), temperature source Axillary, resp. rate 20, weight 75.8 kg, SpO2 96 %.    Vent Mode: PRVC FiO2 (%):  [28 %-40 %] 28 % Set Rate:  [20 bmp] 20 bmp Vt Set:  [470 mL] 470 mL PEEP:  [5 cmH20] 5 cmH20 Plateau Pressure:  [15 cmH20-19 cmH20] 15 cmH20   Intake/Output Summary (Last 24 hours) at 01/18/2022 1039 Last data filed at 01/18/2022 1000 Gross per 24 hour  Intake 2476.69 ml  Output 1110 ml  Net 1366.69 ml   Filed Weights   01/17/22 0455  Weight: 75.8 kg    Examination: General: Adult female, critically ill, lying in bed, lethargic, NAD HEENT: MM pink/moist, anicteric, atraumatic, neck supple Neuro: Unresponsive, RASS -5, unable to follow commands, PERRL +2, no movement to painful stimuli in any extremity CV: s1s2 RRR, NSR paced rhythm on monitor, no r/m/g Pulm: Regular, non labored on NRB 15 L, breath sounds coarse crackles-BUL & diminished-BLL GI: soft, rounded, non tender, bs x 4 GU: foley in place with clear yellow urine Skin: Limited exam- no rashes/lesions noted Extremities: warm/dry, pulses + 2 R/P, +2 edema noted BLE  Resolved Hospital Problem list     Assessment & Plan:  Acute Hypoxic / Hypercapnic Respiratory Failure secondary to suspected aspiration in the setting of possible CAP & acute HFpEF exacerbation  - Ventilator settings: PRVC  8 mL/kg, 60% FiO2, 5 PEEP, continue ventilator support & lung protective strategies - Wean PEEP & FiO2 as tolerated, maintain SpO2 > 90% - Head of bed elevated 30 degrees, VAP protocol in place - Plateau pressures less than 30  cm H20  - Intermittent chest x-ray & ABG PRN - Daily WUA with SBT as tolerated  - Ensure adequate pulmonary hygiene  - F/u cultures, trend PCT -Broadened CAP/aspiration coverage: Zosyn & vancomycin - bronchodilators PRN - PAD protocol in place: continue Fentanyl IVP   Suspected acute on Chronic HFpEF  exacerbation Elevated Troponin secondary to suspected demand ischemia versus NSTEMI Chronic / Paroxysmal Atrial Fibrillation Troponin: 62 BNP: Pending - Trend troponins  - Echocardiogram ordered - f/u BNP - Continuous cardiac monitoring  - Daily weights to assess volume status - Diurese with the use of IV lasix, as hemodynamics and renal function permit -Continue outpatient Eliquis  Acute Kidney Injury superimposed on CKD stage 3b  Baseline Cr: 1.18, Cr on admission:1.50 - Strict I/O's: alert provider if UOP < 0.5 mL/kg/hr - Daily BMP, replace electrolytes PRN - Avoid nephrotoxic agents as able, ensure adequate renal perfusion  Acute encephalopathy secondary to unknown etiology -Stat CT head without contrast - f/u ammonia level -Consider EEG/MRI brain if mentation does not improve with oxygenation support -Neurochecks every 4 -Avoid sedating medications as able - supportive care  Best Practice (right click and "Reselect all SmartList Selections" daily)  Diet/type: NPO w/ meds via tube DVT prophylaxis: DOAC GI prophylaxis: PPI Lines: N/A Foley:  Yes, and it is still needed Code Status:  full code Last date of multidisciplinary goals of care discussion [01/16/22] Updated granddaughter, HCPOA, via telephone.  Questions and concerns answered at this time.  Labs   CBC: Recent Labs  Lab 01/16/22 2342 01/17/22 0549 01/18/22 0357  WBC 4.4 6.7 8.7  NEUTROABS 3.0  --   --   HGB 12.0 10.0* 12.1  HCT 40.5 35.2* 40.4  MCV 90.4 92.4 91.2  PLT 180 204 194     Basic Metabolic Panel: Recent Labs  Lab 01/16/22 2342 01/17/22 0549 01/18/22 0357  NA 135 136 136  K 4.8 4.1 4.0  CL 104 106 104  CO2 '24 23 23  ' GLUCOSE 120* 81 137*  BUN '23 21 18  ' CREATININE 1.50* 1.34* 1.36*  CALCIUM 9.0 8.6* 8.8*  MG  --  2.1 2.1  PHOS  --  5.0* 2.9    GFR: Estimated Creatinine Clearance: 29.2 mL/min (A) (by C-G formula based on SCr of 1.36 mg/dL (H)). Recent Labs  Lab 01/16/22 2342  01/16/22 2343 01/17/22 0549 01/17/22 2256 01/18/22 0357  PROCALCITON  --   --  0.21  --  1.07  WBC 4.4  --  6.7  --  8.7  LATICACIDVEN  --  1.5  --  0.9  --      Liver Function Tests: Recent Labs  Lab 01/16/22 2342  AST 41  ALT 17  ALKPHOS 81  BILITOT 1.2  PROT 8.3*  ALBUMIN 3.3*    No results for input(s): LIPASE, AMYLASE in the last 168 hours. Recent Labs  Lab 01/17/22 0549  AMMONIA 46*    ABG    Component Value Date/Time   PHART 7.28 (L) 01/17/2022 0800   PCO2ART 49 (H) 01/17/2022 0800   PO2ART 76 (L) 01/17/2022 0800   HCO3 23.0 01/17/2022 0800   ACIDBASEDEF 4.1 (H) 01/17/2022 0800   O2SAT 96.1 01/17/2022 0800      Coagulation Profile: No results for input(s): INR, PROTIME in the last 168 hours.  Cardiac Enzymes: No results for input(s): CKTOTAL, CKMB, CKMBINDEX, TROPONINI in the last 168 hours.  HbA1C: Hgb A1c MFr Bld  Date/Time Value Ref Range Status  11/05/2021 02:03 PM 6.1 (  H) 4.8 - 5.6 % Final    Comment:    (NOTE)         Prediabetes: 5.7 - 6.4         Diabetes: >6.4         Glycemic control for adults with diabetes: <7.0   04/01/2020 08:07 AM 5.5 <5.7 % of total Hgb Final    Comment:    For the purpose of screening for the presence of diabetes: . <5.7%       Consistent with the absence of diabetes 5.7-6.4%    Consistent with increased risk for diabetes             (prediabetes) > or =6.5%  Consistent with diabetes . This assay result is consistent with a decreased risk of diabetes. . Currently, no consensus exists regarding use of hemoglobin A1c for diagnosis of diabetes in children. . According to American Diabetes Association (ADA) guidelines, hemoglobin A1c <7.0% represents optimal control in non-pregnant diabetic patients. Different metrics may apply to specific patient populations.  Standards of Medical Care in Diabetes(ADA). .     CBG: Recent Labs  Lab 01/17/22 1702 01/17/22 1938 01/17/22 2303 01/18/22 0432  01/18/22 0757  GLUCAP 133* 135* 150* 135* 123*     Review of Systems:   UTA patient unresponsive unable to participate in interview  Past Medical History:  She,  has a past medical history of Acute postoperative pain (02/09/2018), Arrhythmia, sinus node (04/03/2014), Arthritis, Arthritis, Atrial fibrillation ([place]), Back pain, Bradycardia, Breast cancer ([place]) (2004), Cancer ([place]) (2004), CHF (congestive heart failure) ([place]), Chronic back pain, CKD (chronic kidney disease), COPD (chronic obstructive pulmonary disease) ([place]), COVID-19 (06/11/2020), Cystocele, Decubitus ulcers, Dehydration, Gout, Hematuria, Hepatitis C, Hyperlipidemia, Hypertension, Hypomagnesemia (06/25/2015), NSTEMI (non-ST elevated myocardial infarction) ([place]) (04/08/2021), OAB (overactive bladder), Overactive bladder, Overdose opiate, accidental or unintentional, sequela (10/02/2020), Restless leg, Shoulder pain, bilateral, Urinary frequency, UTI (lower urinary tract infection), and Vaginal atrophy.   Surgical History:   Past Surgical History:  Procedure Laterality Date   ABDOMINAL HYSTERECTOMY     BACK SURGERY     BREAST LUMPECTOMY Right 2004   BREAST SURGERY     rt lumpectomy   CARPAL TUNNEL RELEASE     CATARACT EXTRACTION W/PHACO  10/19/2011   Procedure: CATARACT EXTRACTION PHACO AND INTRAOCULAR LENS PLACEMENT (IOC);  Surgeon: Elta Guadeloupe T. Gershon Crane, MD;  Location: [facility];  Service: Ophthalmology;  Laterality: Right;  CDE:10.81   CATARACT EXTRACTION W/PHACO  11/02/2011   Procedure: CATARACT EXTRACTION PHACO AND INTRAOCULAR LENS PLACEMENT (IOC);  Surgeon: Elta Guadeloupe T. Gershon Crane, MD;  Location: [facility];  Service: Ophthalmology;  Laterality: Left;  CDE 8.60   CHOLECYSTECTOMY     3/18   COLON SURGERY     INSERT / REPLACE / REMOVE PACEMAKER     JOINT REPLACEMENT     bilateral TKA   LOWER EXTREMITY ANGIOGRAPHY Left 10/14/2020   Procedure: LOWER EXTREMITY ANGIOGRAPHY;  Surgeon: Katha Cabal, MD;  Location: [facility];  Service:  Cardiovascular;  Laterality: Left;   LOWER EXTREMITY ANGIOGRAPHY Right 09/08/2021   Procedure: LOWER EXTREMITY ANGIOGRAPHY;  Surgeon: Katha Cabal, MD;  Location: [facility];  Service: Cardiovascular;  Laterality: Right;   PACEMAKER INSERTION N/A 12/24/2015   Procedure: INSERTION PACEMAKER;  Surgeon: Isaias Cowman, MD;  Location: [facility];  Service: Cardiovascular;  Laterality: N/A;   ROTATOR CUFF REPAIR     bilateral   VIDEO BRONCHOSCOPY WITH ENDOBRONCHIAL NAVIGATION Right 11/11/2021   Procedure: VIDEO BRONCHOSCOPY  WITH ENDOBRONCHIAL NAVIGATION;  Surgeon: Ottie Glazier, MD;  Location: [facility];  Service: Thoracic;  Laterality: Right;     Social History:   reports that she quit smoking about 23 years ago. Her smoking use included cigarettes. She has a 50.00 pack-year smoking history. She has never used smokeless tobacco. She reports current drug use. Drug: Oxycodone. She reports that she does not drink alcohol.   Family History:  Her family history includes Breast cancer in her paternal aunt; Breast cancer (age of onset: 68) in her sister; Cancer in her sister; Heart disease in her father and mother; Kidney disease in her brother; Stroke in her mother. There is no history of Anesthesia problems, Hypotension, Malignant hyperthermia, or Pseudochol deficiency.   Allergies Allergies  Allergen Reactions   Flexeril [Cyclobenzaprine Hcl]     Severe Rash   Vancomycin Rash    [place] Syndrome     Home Medications  Prior to Admission medications   Medication Sig Start Date End Date Taking? Authorizing Provider  acetaminophen (TYLENOL) 500 MG tablet Take 500 mg by mouth in the morning and at bedtime.   Yes [provider]  apixaban (ELIQUIS) 2.5 MG TABS tablet Take 1 tablet (2.5 mg total) by mouth 2 (two) times daily. 04/09/21  Yes Wieting, [name], MD  atorvastatin (LIPITOR) 20 MG tablet TAKE 1 TABLET BY MOUTH ONCE DAILY FOR CHOLESTEROL Patient taking differently:  Take 20 mg by mouth daily. 02/17/21  Yes Karamalegos, Alexander J, DO  BREZTRI AEROSPHERE 160-9-4.8 MCG/ACT AERO Inhale 2 puffs into the lungs 2 (two) times daily. 07/07/21  Yes Karamalegos, Devonne Doughty, DO  Cholecalciferol (VITAMIN D) 2000 units tablet Take 2,000 Units by mouth daily.    Yes [provider]  empagliflozin (JARDIANCE) 10 MG TABS tablet Take 1 tablet (10 mg total) by mouth daily before breakfast. 12/29/21  Yes [name] A, FNP  furosemide (LASIX) 40 MG tablet Take 1 tablet (40 mg total) by mouth daily. 09/23/21  Yes Nicole Kindred A, DO  gabapentin (NEURONTIN) 300 MG capsule Take 1 capsule (300 mg total) by mouth 3 (three) times daily. 11/12/21  Yes Annita Brod, MD  GNP ASPIRIN LOW DOSE 81 MG EC tablet TAKE 1 TABLET BY MOUTH ONCE DAILY. *SWALLOW WHOLE* 08/18/21  Yes Schnier, Dolores Lory, MD  levocetirizine (XYZAL) 5 MG tablet TAKE (1/2) TABLET BY MOUTH EVERY OTHER DAY. 06/19/21  Yes Karamalegos, Devonne Doughty, DO  Melatonin 10 MG TABS Take 10 mg by mouth at bedtime.   Yes [provider]  Multiple Vitamin (MULTIVITAMIN WITH MINERALS) TABS tablet Take 1 tablet by mouth daily. 11/13/21  Yes Annita Brod, MD  oxyCODONE (OXY IR/ROXICODONE) 5 MG immediate release tablet Take 2 tablets (10 mg total) by mouth 2 (two) times daily. May also take 1 tablet (5 mg total) at bedtime as needed for severe pain. Must last 30 days.. 12/23/21 01/22/22 Yes Milinda Pointer, MD  rOPINIRole (REQUIP) 2 MG tablet TAKE 2 TABLETS BY MOUTH AT BEDTIME 10/21/21  Yes Karamalegos, Alexander J, DO  albuterol (PROVENTIL) (2.5 MG/3ML) 0.083% nebulizer solution USE 1 VIAL IN NEBULIZER EVERY 8 HOURS AS NEEDED FOR WHEEZING OR SHORTNESS OF BREATH. 12/22/21   Karamalegos, Devonne Doughty, DO  bisacodyl (DULCOLAX) 5 MG EC tablet Take 2 tablets (10 mg total) by mouth daily as needed for moderate constipation. 07/04/20   Karamalegos, Devonne Doughty, DO  diclofenac Sodium (VOLTAREN) 1 % GEL Apply 4 g topically 2  (two) times daily as needed (As needed  for pain). 10/15/21   Kris Hartmann, NP  feeding supplement (ENSURE ENLIVE / ENSURE PLUS) LIQD Take 237 mLs by mouth 2 (two) times daily between meals. 11/13/21   Annita Brod, MD  oxyCODONE (OXY IR/ROXICODONE) 5 MG immediate release tablet Take 2 tablets (10 mg total) by mouth 2 (two) times daily. May also take 1 tablet (5 mg total) at bedtime as needed for severe pain. Must last 30 days.. 01/22/22 02/21/22  Milinda Pointer, MD  oxyCODONE (OXY IR/ROXICODONE) 5 MG immediate release tablet Take 2 tablets (10 mg total) by mouth 2 (two) times daily. May also take 1 tablet (5 mg total) at bedtime as needed for severe pain. Must last 30 days.. 02/21/22 03/23/22  Milinda Pointer, MD  OXYGEN Inhale 2 L into the lungs daily.    [provider]    Critical care provider statement:   Total critical care time: 33 minutes   Performed by: Lanney Gins MD   Critical care time was exclusive of separately billable procedures and treating other patients.   Critical care was necessary to treat or prevent imminent or life-threatening deterioration.   Critical care was time spent personally by me on the following activities: development of treatment plan with patient and/or surrogate as well as nursing, discussions with consultants, evaluation of patient's response to treatment, examination of patient, obtaining history from patient or surrogate, ordering and performing treatments and interventions, ordering and review of laboratory studies, ordering and review of radiographic studies, pulse oximetry and re-evaluation of patient's condition.    Ottie Glazier, M.D.  Pulmonary & Critical Care Medicine

## 2022-01-18 NOTE — Consult Note (Addendum)
NEURO HOSPITALIST CONSULT NOTE   Requesting physician: Dr. Lanney Gins  Reason for Consult: Possible seizure activity  History obtained from:  Chart     HPI:                                                                                                                                          Kristin [name] is an 88 y.o. female with an extensive PMHx as noted below, who was admitted on Sunday in the early AM after abrupt LOC following presentation to the ED via EMS for lethargy and weakness. She was in her USOH until early on Saturday evening when she picked up the phone during a call but did not verbally respond. When granddaughter walked over to check on her, she found her sitting at the kitchen table with her head on the table.  She was responsive and able to talk, but very weak. In the ED, CXR was concerning for multifocal PNA. She was started on ABX. In the ED she experienced acute respiratory failure thought to be secondary to aspiration, requiring emergent intubation and mechanical ventilatory support. Overnight she became more responsive off sedation but was agitated. Initially the episodes appeared agitation-driven, but then started to look more like seizure activity. She was loaded with Keppra and started on low dose propofol gtt. EEG revealed continuous generalized slowing, most consistent with a moderate to severe diffuse encephalopathy, nonspecific to etiology.   Past Medical History:  Diagnosis Date   Acute postoperative pain 02/09/2018   Arrhythmia, sinus node 04/03/2014   Arthritis    Arthritis    Atrial fibrillation (HCC)    Back pain    lower back chronic   Bradycardia    Breast cancer ([place]) 2004   right breast cancer   Cancer ([place]) 2004   rt breast cancer-post lumpectomy- chemo/rad   CHF (congestive heart failure) (HCC)    Chronic back pain    CKD (chronic kidney disease)    COPD (chronic obstructive pulmonary disease) ([place])    wears O2 at 2L via [place]  at night   COVID-19 06/11/2020   Cystocele    Decubitus ulcers    Dehydration    Gout    Hematuria    Hepatitis C    Hyperlipidemia    Hypertension    Hypomagnesemia 06/25/2015   NSTEMI (non-ST elevated myocardial infarction) ([place]) 04/08/2021   OAB (overactive bladder)    Overactive bladder    Overdose opiate, accidental or unintentional, sequela 10/02/2020   Restless leg    Shoulder pain, bilateral    Urinary frequency    UTI (lower urinary tract infection)    Vaginal atrophy     Past Surgical History:  Procedure Laterality Date   ABDOMINAL HYSTERECTOMY     BACK SURGERY  BREAST LUMPECTOMY Right 2004   BREAST SURGERY     rt lumpectomy   CARPAL TUNNEL RELEASE     CATARACT EXTRACTION W/PHACO  10/19/2011   Procedure: CATARACT EXTRACTION PHACO AND INTRAOCULAR LENS PLACEMENT ([place]);  Surgeon: Elta Guadeloupe T. Gershon Crane, MD;  Location: [facility];  Service: Ophthalmology;  Laterality: Right;  CDE:10.81   CATARACT EXTRACTION W/PHACO  11/02/2011   Procedure: CATARACT EXTRACTION PHACO AND INTRAOCULAR LENS PLACEMENT ([facility]);  Surgeon: Elta Guadeloupe T. Gershon Crane, MD;  Location: [facility];  Service: Ophthalmology;  Laterality: Left;  CDE 8.60   CHOLECYSTECTOMY     3/18   COLON SURGERY     INSERT / REPLACE / REMOVE PACEMAKER     JOINT REPLACEMENT     bilateral TKA   LOWER EXTREMITY ANGIOGRAPHY Left 10/14/2020   Procedure: LOWER EXTREMITY ANGIOGRAPHY;  Surgeon: Katha Cabal, MD;  Location: [place] [facility];  Service: Cardiovascular;  Laterality: Left;   LOWER EXTREMITY ANGIOGRAPHY Right 09/08/2021   Procedure: LOWER EXTREMITY ANGIOGRAPHY;  Surgeon: Katha Cabal, MD;  Location: [place] [facility];  Service: Cardiovascular;  Laterality: Right;   PACEMAKER INSERTION N/A 12/24/2015   Procedure: INSERTION PACEMAKER;  Surgeon: Isaias Cowman, MD;  Location: [facility];  Service: Cardiovascular;  Laterality: N/A;   ROTATOR CUFF REPAIR     bilateral   VIDEO BRONCHOSCOPY WITH ENDOBRONCHIAL NAVIGATION Right  11/11/2021   Procedure: VIDEO BRONCHOSCOPY WITH ENDOBRONCHIAL NAVIGATION;  Surgeon: Ottie Glazier, MD;  Location: [facility];  Service: Thoracic;  Laterality: Right;    Family History  Problem Relation Age of Onset   Kidney disease Brother    Heart disease Father    Heart disease Mother    Stroke Mother    Cancer Sister    Breast cancer Sister 41   Breast cancer Paternal Aunt    Anesthesia problems Neg Hx    Hypotension Neg Hx    Malignant hyperthermia Neg Hx    Pseudochol deficiency Neg Hx              Social History:  reports that she quit smoking about 23 years ago. Her smoking use included cigarettes. She has a 50.00 pack-year smoking history. She has never used smokeless tobacco. She reports current drug use. Drug: Oxycodone. She reports that she does not drink alcohol.  Allergies  Allergen Reactions   Flexeril [Cyclobenzaprine Hcl]     Severe Rash   Vancomycin Rash    [place] Syndrome    MEDICATIONS:                                                                                                                     Prior to Admission:  Medications Prior to Admission  Medication Sig Dispense Refill Last Dose   acetaminophen (TYLENOL) 500 MG tablet Take 500 mg by mouth in the morning and at bedtime.   01/16/2022 at am   apixaban (ELIQUIS) 2.5 MG TABS tablet Take 1 tablet (2.5 mg total) by mouth 2 (two) times daily. [place]  tablet 0 01/16/2022 at am   atorvastatin (LIPITOR) 20 MG tablet TAKE 1 TABLET BY MOUTH ONCE DAILY FOR CHOLESTEROL (Patient taking differently: Take 20 mg by mouth daily.) 30 tablet 11 01/15/2022 at pm   BREZTRI AEROSPHERE 160-9-4.8 MCG/ACT AERO Inhale 2 puffs into the lungs 2 (two) times daily. 10.7 g 5 01/16/2022 at am   Cholecalciferol (VITAMIN D) 2000 units tablet Take 2,000 Units by mouth daily.    01/16/2022 at am   empagliflozin (JARDIANCE) 10 MG TABS tablet Take 1 tablet (10 mg total) by mouth daily before breakfast. 30 tablet 5 01/16/2022 at am   furosemide  (LASIX) 40 MG tablet Take 1 tablet (40 mg total) by mouth daily. 30 tablet 1 01/16/2022 at am   gabapentin (NEURONTIN) 300 MG capsule Take 1 capsule (300 mg total) by mouth 3 (three) times daily. 90 capsule 1 01/16/2022 at am   GNP ASPIRIN LOW DOSE 81 MG EC tablet TAKE 1 TABLET BY MOUTH ONCE DAILY. *SWALLOW WHOLE* 150 tablet 11 01/16/2022 at am   levocetirizine (XYZAL) 5 MG tablet TAKE (1/2) TABLET BY MOUTH EVERY OTHER DAY. 8 tablet 9 01/16/2022 at am   Melatonin 10 MG TABS Take 10 mg by mouth at bedtime.   01/15/2022 at pm   Multiple Vitamin (MULTIVITAMIN WITH MINERALS) TABS tablet Take 1 tablet by mouth daily. 30 tablet 1 01/16/2022 at am   oxyCODONE (OXY IR/ROXICODONE) 5 MG immediate release tablet Take 2 tablets (10 mg total) by mouth 2 (two) times daily. May also take 1 tablet (5 mg total) at bedtime as needed for severe pain. Must last 30 days.. 150 tablet 0 01/16/2022 at am   rOPINIRole (REQUIP) 2 MG tablet TAKE 2 TABLETS BY MOUTH AT BEDTIME 180 tablet 1 01/15/2022 at pm   albuterol (PROVENTIL) (2.5 MG/3ML) 0.083% nebulizer solution USE 1 VIAL IN NEBULIZER EVERY 8 HOURS AS NEEDED FOR WHEEZING OR SHORTNESS OF BREATH. 90 mL 0 prn   bisacodyl (DULCOLAX) 5 MG EC tablet Take 2 tablets (10 mg total) by mouth daily as needed for moderate constipation. 180 tablet 1 prn   diclofenac Sodium (VOLTAREN) 1 % GEL Apply 4 g topically 2 (two) times daily as needed (As needed for pain). 100 g 1 prn   feeding supplement (ENSURE ENLIVE / ENSURE PLUS) LIQD Take 237 mLs by mouth 2 (two) times daily between meals. 237 mL 12    [START ON 01/22/2022] oxyCODONE (OXY IR/ROXICODONE) 5 MG immediate release tablet Take 2 tablets (10 mg total) by mouth 2 (two) times daily. May also take 1 tablet (5 mg total) at bedtime as needed for severe pain. Must last 30 days.. 150 tablet 0    [START ON 02/21/2022] oxyCODONE (OXY IR/ROXICODONE) 5 MG immediate release tablet Take 2 tablets (10 mg total) by mouth 2 (two) times daily. May also take 1  tablet (5 mg total) at bedtime as needed for severe pain. Must last 30 days.. 150 tablet 0    OXYGEN Inhale 2 L into the lungs daily.      Scheduled:  chlorhexidine gluconate (MEDLINE KIT)  15 mL Mouth Rinse BID   Chlorhexidine Gluconate Cloth  6 each Topical Daily   diazepam  1 mg Per Tube Q6H   docusate  100 mg Per Tube BID   enoxaparin (LOVENOX) injection  1 mg/kg Subcutaneous Q24H   mouth rinse  15 mL Mouth Rinse 10 times per day   pantoprazole sodium  40 mg Per Tube Daily   polyethylene glycol  17 g Per Tube Daily   sodium chloride flush  10-40 mL Intracatheter Q12H   Continuous:  sodium chloride Stopped (01/17/22 1742)   sodium chloride     ampicillin-sulbactam (UNASYN) IV Stopped (01/18/22 1635)   dextrose 50 mL/hr at 01/18/22 1800   levETIRAcetam Stopped (01/18/22 1605)   norepinephrine (LEVOPHED) Adult infusion 15 mcg/min (01/18/22 2045)   propofol (DIPRIVAN) infusion 25 mcg/kg/min (01/18/22 2056)     ROS:                                                                                                                                       Unable to obtain due to intubation and sedation.    Blood pressure (!) 98/55, pulse 64, temperature 98.4 F (36.9 C), temperature source Axillary, resp. rate 18, weight 75.8 kg, SpO2 96 %.   General Examination:                                                                                                       Physical Exam  HEENT-  [place]/AT    Lungs- Intubated Extremities- Warm and well perfused   Neurological Examination Mental Status: Intubated and sedated on propofol at a rate of 25. No eye opening to noxious stimuli. Does not respond to voice or commands. No attempts to communicate. Moves upper extremities nonpurposefully to sternal rub. Weak withdrawal BLE.  Cranial Nerves:  II: No blink to threat. Sluggish pupils at 2 mm bilaterally.  III,IV, VI: Minimal doll's eye reflex in the context of sedation. No forced gaze  deviation.  V,VII: Weak facial movement to noxious is symmetric.  VIII: No response to voice IX,X: Intubated XI: Head is midline XII: Unable to assess Motor/Sensory: Weak upper extremity movements to noxious are symmetric at 2/5 bilaterally Weak BLE withdrawal to noxious at 2/5 bilaterally Deep Tendon Reflexes: Hypoactive in the context of sedation.  Plantars: Equivocal bilaterally  Cerebellar/Gait: Unable to assess   Lab Results: Basic Metabolic Panel: Recent Labs  Lab 01/16/22 2342 01/17/22 0549 01/18/22 0357  NA 135 136 136  K 4.8 4.1 4.0  CL 104 106 104  CO2 '24 23 23  ' GLUCOSE 120* 81 137*  BUN '23 21 18  ' CREATININE 1.50* 1.34* 1.36*  CALCIUM 9.0 8.6* 8.8*  MG  --  2.1 2.1  PHOS  --  5.0* 2.9    CBC: Recent Labs  Lab 01/16/22 2342 01/17/22 0549 01/18/22 0357  WBC 4.4 6.7 8.7  NEUTROABS 3.0  --   --  HGB 12.0 10.0* 12.1  HCT 40.5 35.2* 40.4  MCV 90.4 92.4 91.2  PLT 180 204 194    Cardiac Enzymes: No results for input(s): CKTOTAL, CKMB, CKMBINDEX, TROPONINI in the last 168 hours.  Lipid Panel: No results for input(s): CHOL, TRIG, HDL, CHOLHDL, VLDL, LDLCALC in the last 168 hours.  Imaging: CT HEAD WO CONTRAST (5MM)  Result Date: 01/18/2022 CLINICAL DATA:  Seizure, new onset, no history of trauma. EXAM: CT HEAD WITHOUT CONTRAST TECHNIQUE: Contiguous axial images were obtained from the base of the skull through the vertex without intravenous contrast. RADIATION DOSE REDUCTION: This exam was performed according to the departmental dose-optimization program which includes automated exposure control, adjustment of the mA and/or kV according to patient size and/or use of iterative reconstruction technique. COMPARISON:  Yesterday FINDINGS: Brain: No evidence of acute infarction, hemorrhage, hydrocephalus, extra-axial collection or mass lesion/mass effect. Age congruent cerebral volume loss. Less than typical white matter low-density. Vascular: No hyperdense vessel or  unexpected calcification. Skull: No acute finding.  Hyperostosis interna. Sinuses/Orbits: No acute finding.  Bilateral cataract resection. IMPRESSION: No acute or interval finding. Electronically Signed   By: Jorje Guild M.D.   On: 01/18/2022 06:33   CT HEAD WO CONTRAST (5MM)  Result Date: 01/17/2022 CLINICAL DATA:  Mental status change with unknown cause EXAM: CT HEAD WITHOUT CONTRAST TECHNIQUE: Contiguous axial images were obtained from the base of the skull through the vertex without intravenous contrast. RADIATION DOSE REDUCTION: This exam was performed according to the departmental dose-optimization program which includes automated exposure control, adjustment of the mA and/or kV according to patient size and/or use of iterative reconstruction technique. COMPARISON:  12/18/2021 FINDINGS: Brain: No evidence of acute infarction, hemorrhage, hydrocephalus, extra-axial collection or mass lesion/mass effect. Age expected brain volume. Mild chronic small vessel ischemic change in the hemispheric white matter Vascular: No hyperdense vessel or unexpected calcification. Skull: Retro dental ligamentous thickening contacting the cervicomedullary junction without high-grade foramen magnum stenosis. Sinuses/Orbits: No acute finding.  Bilateral cataract resection IMPRESSION: No acute finding.  Unremarkable appearance of the brain for age Electronically Signed   By: Jorje Guild M.D.   On: 01/17/2022 04:16   US Venous Img Lower Bilateral (DVT)  Result Date: 01/17/2022 CLINICAL DATA:  History of deep venous thrombosis, history of lung and breast cancer EXAM: BILATERAL LOWER EXTREMITY VENOUS DOPPLER ULTRASOUND TECHNIQUE: Gray-scale sonography with compression, as well as color and duplex ultrasound, were performed to evaluate the deep venous system(s) from the level of the common femoral vein through the popliteal and proximal calf veins. COMPARISON:  09/18/2021 FINDINGS: VENOUS Normal compressibility of the  common femoral, superficial femoral, and popliteal veins, as well as the visualized calf veins. Visualized portions of profunda femoral vein and great saphenous vein unremarkable. No filling defects to suggest DVT on grayscale or color Doppler imaging. Doppler waveforms show normal direction of venous flow, normal respiratory plasticity and response to augmentation. OTHER None. Limitations: none IMPRESSION: 1. No evidence of deep venous thrombosis within either lower extremity. Electronically Signed   By: Randa Ngo M.D.   On: 01/17/2022 20:06   DG Chest Port 1 View  Result Date: 01/17/2022 CLINICAL DATA:  Status post intubation. EXAM: PORTABLE CHEST 1 VIEW COMPARISON:  01/16/2022 FINDINGS: 0448 hours. Endotracheal tube tip is 2.6 cm above the base of the carina. Catheter tubing overlying the hypopharynx may be an NG tube coiled in the mouth and hypopharynx. The cardio pericardial silhouette is enlarged. Similar appearance diffuse interstitial opacity with patchy asymmetric  airspace disease. No substantial pleural effusion. Bilateral shoulder replacement with left permanent pacemaker noted. IMPRESSION: 1. Catheter tubing overlying the hypopharynx may reflect NG tube coiled in the mouth and hypopharynx. 2. Endotracheal tube tip 2.6 cm above the base of the carina. 3. Stable diffuse interstitial and patchy asymmetric bilateral airspace disease. These results will be called to the ordering clinician or representative by the Radiologist Assistant, and communication documented in the PACS or [employer]. Electronically Signed   By: Misty Stanley M.D.   On: 01/17/2022 05:02   DG Chest Port 1 View  Result Date: 01/16/2022 CLINICAL DATA:  Weakness, shortness of breath EXAM: PORTABLE CHEST 1 VIEW COMPARISON:  CTA chest dated 12/18/2021 FINDINGS: Multifocal patchy opacities, progressive when compared to the prior. The patient has known right upper lobe tumor and multifocal scarring. Given progression,  superimposed pneumonia is not excluded, although some degree of progressive tumor could account for the progression in the right upper lobe. No pleural effusion or pneumothorax. Cardiomegaly. Left subclavian pacemaker. Thoracic aortic atherosclerosis. Bilateral shoulder arthroplasties. IMPRESSION: Multifocal patchy opacities, progressive when compared to prior. While some of this may reflect tumor in the right upper lobe, superimposed multifocal pneumonia is suspected. Electronically Signed   By: Julian Hy M.D.   On: 01/16/2022 23:40   ECHOCARDIOGRAM COMPLETE  Result Date: 01/17/2022    ECHOCARDIOGRAM REPORT   Patient Name:   AKELIA HUSTED Date of Exam: 01/17/2022 Medical Rec #:  [id number]     Height:       67.0 in Accession #:    [id number]    Weight:       167.1 lb Date of Birth:  April 22, 1930      BSA:          1.874 m Patient Age:    38 years      BP:           108/84 mmHg Patient Gender: F             HR:           75 bpm. Exam Location:  [facility] Procedure: 2D Echo Indications:     Cardiomyopathy- Unspecified I42.9  History:         Patient has prior history of Echocardiogram examinations, most                  recent 05/24/2020.  Sonographer:     Kathlen Brunswick RDCS Referring Phys:  [id number] BRITTON L [place] Diagnosing Phys: Donnelly Angelica  Sonographer Comments: Echo performed with patient supine and on artificial respirator. Image acquisition challenging due to respiratory motion. IMPRESSIONS  1. Left ventricular ejection fraction, by estimation, is 50 to 55%. The left ventricle has low normal function. The left ventricle has no regional wall motion abnormalities. There is mild left ventricular hypertrophy. Left ventricular diastolic parameters are consistent with Grade I diastolic dysfunction (impaired relaxation). There is the interventricular septum is flattened in diastole ('D' shaped left ventricle), consistent with right ventricular volume overload.  2. Right ventricular systolic function is  moderately reduced. The right ventricular size is severely enlarged.  3. Right atrial size was moderately dilated.  4. The mitral valve is normal in structure. Mild mitral valve regurgitation.  5. Tricuspid valve regurgitation is moderate.  6. The aortic valve is grossly normal. Aortic valve regurgitation is trivial. Aortic valve sclerosis is present, with no evidence of aortic valve stenosis.  7. Aortic dilatation noted. There is mild dilatation of the aortic root, measuring 39  mm. Conclusion(s)/Recommendation(s): SEVERE DILATED, HYPOCONTRACTILE RV. FINDINGS  Left Ventricle: Left ventricular ejection fraction, by estimation, is 50 to 55%. The left ventricle has low normal function. The left ventricle has no regional wall motion abnormalities. The left ventricular internal cavity size was normal in size. There is mild left ventricular hypertrophy. Abnormal (paradoxical) septal motion, consistent with left bundle branch block and the interventricular septum is flattened in diastole ('D' shaped left ventricle), consistent with right ventricular volume overload. Left ventricular diastolic parameters are consistent with Grade I diastolic dysfunction (impaired relaxation). Right Ventricle: The right ventricular size is severely enlarged. Right vetricular wall thickness was not well visualized. Right ventricular systolic function is moderately reduced. Left Atrium: Left atrial size was normal in size. Right Atrium: Right atrial size was moderately dilated. Pericardium: Trivial pericardial effusion is present. Mitral Valve: The mitral valve is normal in structure. Mild mitral valve regurgitation. Tricuspid Valve: The tricuspid valve is normal in structure. Tricuspid valve regurgitation is moderate. Aortic Valve: The aortic valve is grossly normal. Aortic valve regurgitation is trivial. Aortic valve sclerosis is present, with no evidence of aortic valve stenosis. Aortic valve peak gradient measures 13.2 mmHg. Pulmonic  Valve: The pulmonic valve was not well visualized. Pulmonic valve regurgitation is mild to moderate. Aorta: Aortic dilatation noted. There is mild dilatation of the aortic root, measuring 39 mm. Venous: IVC assessment for right atrial pressure unable to be performed due to mechanical ventilation. IAS/Shunts: No atrial level shunt detected by color flow Doppler.  LEFT VENTRICLE PLAX 2D LVIDd:         3.75 cm     Diastology LVIDs:         2.70 cm     LV e' medial:    3.81 cm/s LV PW:         1.40 cm     LV E/e' medial:  14.4 LV IVS:        1.12 cm     LV e' lateral:   7.94 cm/s LVOT diam:     2.00 cm     LV E/e' lateral: 6.9 LV SV:         47 LV SV Index:   25 LVOT Area:     3.14 cm  LV Volumes (MOD) LV vol d, MOD A4C: 52.9 ml LV vol s, MOD A4C: 23.3 ml LV SV MOD A4C:     52.9 ml RIGHT VENTRICLE RV Basal diam:  5.98 cm RV S prime:     11.90 cm/s TAPSE (M-mode): 2.2 cm LEFT ATRIUM             Index        RIGHT ATRIUM           Index LA diam:        4.20 cm 2.24 cm/m   RA Area:     31.00 cm LA Vol (A2C):   37.4 ml 19.96 ml/m  RA Volume:   128.00 ml 68.32 ml/m LA Vol (A4C):   43.7 ml 23.32 ml/m LA Biplane Vol: 44.6 ml 23.80 ml/m  AORTIC VALVE                 PULMONIC VALVE AV Area (Vmax): 1.68 cm     PV Vmax:          1.25 m/s AV Vmax:        182.00 cm/s  PV Peak grad:     6.2 mmHg AV Peak Grad:   13.2 mmHg    PR  End Diast Vel: 8.64 msec LVOT Vmax:      97.50 cm/s LVOT Vmean:     57.500 cm/s LVOT VTI:       0.150 m  AORTA Ao Root diam: 3.90 cm Ao Asc diam:  2.70 cm MITRAL VALVE               TRICUSPID VALVE MV Area (PHT): 2.66 cm    TV Peak grad:   27.1 mmHg MV Decel Time: 285 msec    TV Vmax:        2.60 m/s MV E velocity: 54.80 cm/s MV A velocity: 96.40 cm/s  SHUNTS MV E/A ratio:  0.57        Systemic VTI:  0.15 m                            Systemic Diam: 2.00 cm Donnelly Angelica Electronically signed by Donnelly Angelica Signature Date/Time: 01/17/2022/5:20:42 PM    Final    [name] EKG SITE RITE  Result Date:  01/17/2022 If Site Rite image not attached, placement could not be confirmed due to current cardiac rhythm.    Assessment: 88 y.o. female with an extensive PMHx as noted below, who was admitted on 'Sunday in the early AM after abrupt LOC following presentation to the ED via EMS for lethargy and weakness. She was in her USOH until early on Saturday evening when she picked up the phone during a call but did not verbally respond. When granddaughter walked over to check on her, she found her sitting at the kitchen table with her head on the table.  She was responsive and able to talk, but very weak. In the ED, CXR was concerning for multifocal PNA. She was started on ABX. In the ED she experienced acute respiratory failure thought to be secondary to aspiration, requiring emergent intubation and mechanical ventilatory support. Overnight she became more responsive off sedation but was agitated. Initially the episodes appeared agitation-driven, but then started to look more like seizure activity. She was loaded with Keppra and started on low dose propofol gtt.  - Exam under propofol sedation at a rate of 25 reveals weak corneal reflexes, weak facial movement to noxious as well as semipurposeful and weak withdrawal movements to noxious stimuli. No seizure-like motor activity noted on exam.  - EEG reveals continuous generalized slowing, most consistent with a moderate to severe diffuse encephalopathy, nonspecific to etiology.  - CT head: No evidence of acute infarction, hemorrhage, hydrocephalus, extra-axial collection or mass lesion/mass effect. Age expected brain volume. Mild chronic small vessel ischemic change in the hemispheric white matter - DDx for her abnormal movements continues primarily to be seizure activity versus agitation while under sedation.   Recommendations: 1. Continue Keppra at 500 mg IV BID 2. MRI brain when able to tolerate 3. Has a history of atrial fibrillation on Eliquis. Plan per ICU  note was to continue Eliquis, but this is not currently on her medications list and therapeutic heparin also note ordered. I have contacted the Critical Care team by telephone and notified them. Per our conversation, they will make sure that there is no contraindication and if not, most likely will restart anticoagulation.     45'  minutes spent in the neurological evaluation and management of this critically ill patient.   Addendum: - Patient has a pacemaker. MRI brain cannot be obtained.    Electronically signed: Dr. Kerney Elbe 01/18/2022, 11:08 AM

## 2022-01-18 NOTE — Progress Notes (Signed)
Eeg done 

## 2022-01-18 NOTE — Progress Notes (Signed)
Patient transported down for head CT with RT, CNA, and this RN. Patient tolerated well. Awaiting results.

## 2022-01-18 NOTE — Progress Notes (Addendum)
Suspected New Onset Seizure-like activity Called bedside by nursing twice due to agitation. Initial episode, nursing described her reaching purposefully for the ETT and shaking her arm in an agitated way before shaking both upper extremities. Upon my arrival bedside, she appeared responsive during this episode to her name, but unable to follow commands. Episode ended with valium 2.5 mg IV. Called to bedside again after additional episode that was unrelieved by valium PRN. This episode different as patient was staring at the ceiling with rhythmic posturing, almost decorticate. Nursing in the process of giving 100 mcg of fentanyl which also stopped the episode. Due to concern for seizure activity propofol started but patient did not tolerate this hemodynamically. - versed drip started with bolus for seizure activity - consulted neurology, appreciate input - loaded with Keppra, continue 500 mg BID - waiting on renal function (which is in process) to determine imaging: CT head wo contrast or w contrast  Additional CC time: 30 minutes  Addendum: renal function slightly improved from admit but slightly worse than baseline. Discussed with HCPOA at length regarding risk vs reward of CT w contrast imaging with the patient's current renal function. Decision made to obtain CT wo contrast, ordered to rule out any acute issue. Can continue to discuss additional imaging with day team.   Domingo Pulse Rust-Chester, AGACNP-BC Acute Care Nurse Practitioner [place]   [phone] / [phone] Please see Amion for pager details.

## 2022-01-18 NOTE — Progress Notes (Signed)
Initial Nutrition Assessment  DOCUMENTATION CODES:   Non-severe (moderate) malnutrition in context of chronic illness  INTERVENTION:   -Once able to obtain feeding access, recommend:  Initiate Vital AF 1.2 @ 20 ml/hr and increase by 10 ml every 4 hours to goal rate of 60 ml/hr.   30 ml free water flush every 4 hours to maintain tube patency  Tube feeding regimen provides 1728 kcal (98% of needs), 108 grams of protein, and 1168 ml of H2O.  Total free water: 1348  NUTRITION DIAGNOSIS:   Moderate Malnutrition related to chronic illness (COPD) as evidenced by mild fat depletion, moderate fat depletion, mild muscle depletion, moderate muscle depletion.  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   5/28- intubated  Patient is currently intubated on ventilator support MV: 9.9 L/min Temp (24hrs), Avg:100.2 F (37.9 C), Min:98.4 F (36.9 C), Max:102 F (38.9 C)  Reviewed I/O's: +1.3 L x 24 hours and +940 ml x 24 hours  UOP: 1.3 L x 24 hours  Case discussed with MD, RN, and during ICU rounds. Pt is full code and intubated. Prior to intubation, she was able to verbalize desire for full code. Pt with foley and PICC line. Plan for EEG due to seizure activity.   Per RN, OG/NGT placement attempted x 3 unsuccessfully. NP to try placement and will consult fluoroscopy if unsuccessful.    Reviewed wt hx; pt has experienced a 2.4% wt loss over the past month, which is not significant for time frame.    Medications reviewed and  include colace, dextrose 10% infusion @ 50 ml/hr, keppra, versed, and levophed, and miralax.   Lab Results  Component Value Date   HGBA1C 6.1 (H) 11/05/2021   PTA DM medications are 10 mg jardiance daily.   Labs reviewed: CBGS: 116-150 (inpatient orders for glycemic control are none).    NUTRITION - FOCUSED PHYSICAL EXAM:  Flowsheet Row Most Recent Value  Orbital Region Moderate depletion  Upper Arm Region Mild depletion  Thoracic and Lumbar Region Mild depletion  Buccal Region Moderate depletion  Temple Region Moderate depletion  Clavicle Bone Region Mild depletion  Clavicle and Acromion Bone Region Mild depletion  Scapular Bone Region Mild depletion  Dorsal Hand Moderate depletion  Patellar Region No depletion  Anterior Thigh Region No depletion  Posterior Calf Region No depletion  Edema (RD Assessment) Mild  Hair Reviewed  Eyes Reviewed  Mouth Reviewed  Skin Reviewed  Nails Reviewed       Diet Order:   Diet Order             Diet NPO time specified  Diet effective now                   EDUCATION NEEDS:   Not appropriate for education at this time  Skin:  Skin Assessment: Reviewed RN Assessment  Last BM:  Unknown  Height:   Ht Readings from Last 1 Encounters:  12/28/21 5\' 7"  (1.702 m)    Weight:   Wt Readings from Last 1 Encounters:  01/17/22 75.8 kg    Ideal Body Weight:  61.4 kg  BMI:  Body mass index is 26.17 kg/m.  Estimated Nutritional Needs:   Kcal:  1760  Protein:  100-115 grams  Fluid:  > 1.7 L    Loistine Chance, RD, LDN, [place] Registered Dietitian II Certified Diabetes Care and Education Specialist Please refer to [facility] for RD and/or RD on-call/weekend/after hours pager

## 2022-01-18 NOTE — Procedures (Signed)
Patient Name: Kristin [name]  MRN: [id number]  Epilepsy Attending: Lora Havens  Referring Physician/Provider: Rust-Chester, Huel Cote, NP Date: 01/18/2022 Duration: 23.24 mins  Patient history: 88 year old female with seizure-like activity described as shaking in both upper extremities.  EEG to evaluate for seizure.  Level of alertness:  lethargic   AEDs during EEG study: Keppra, Versed  Technical aspects: This EEG study was done with scalp electrodes positioned according to the 10-20 International system of electrode placement. Electrical activity was acquired at a sampling rate of 500Hz  and reviewed with a high frequency filter of 70Hz  and a low frequency filter of 1Hz . EEG data were recorded continuously and digitally stored.   Description: EEG showed continuous generalized 3-6 Esti And delta slowing, at times with triphasic morphology.  Hyperventilation and photic stimulation were not performed.     ABNORMALITY - Continuous slow, generalized  IMPRESSION: This study is suggestive of moderate to severe diffuse encephalopathy, nonspecific etiology. No seizures or definite epileptiform discharges were seen throughout the recording.  Laurent Cargile Barbra Sarks

## 2022-01-19 ENCOUNTER — Inpatient Hospital Stay: Payer: Medicare HMO

## 2022-01-19 DIAGNOSIS — J189 Pneumonia, unspecified organism: Secondary | ICD-10-CM | POA: Diagnosis not present

## 2022-01-19 LAB — BLOOD GAS, ARTERIAL
Acid-base deficit: 4.3 mmol/L — ABNORMAL HIGH (ref 0.0–2.0)
Bicarbonate: 23 mmol/L (ref 20.0–28.0)
O2 Content: 4 L/min
O2 Saturation: 87.8 %
Patient temperature: 37
pCO2 arterial: 50 mmHg — ABNORMAL HIGH (ref 32–48)
pH, Arterial: 7.27 — ABNORMAL LOW (ref 7.35–7.45)
pO2, Arterial: 60 mmHg — ABNORMAL LOW (ref 83–108)

## 2022-01-19 LAB — BASIC METABOLIC PANEL
Anion gap: 6 (ref 5–15)
BUN: 17 mg/dL (ref 8–23)
CO2: 24 mmol/L (ref 22–32)
Calcium: 8.5 mg/dL — ABNORMAL LOW (ref 8.9–10.3)
Chloride: 108 mmol/L (ref 98–111)
Creatinine, Ser: 1.02 mg/dL — ABNORMAL HIGH (ref 0.44–1.00)
GFR, Estimated: 52 mL/min — ABNORMAL LOW (ref >60)
Glucose, Bld: 110 mg/dL — ABNORMAL HIGH (ref 70–99)
Potassium: 3.4 mmol/L — ABNORMAL LOW (ref 3.5–5.1)
Sodium: 138 mmol/L (ref 135–145)

## 2022-01-19 LAB — CBC
HCT: 33.6 % — ABNORMAL LOW (ref 36.0–46.0)
Hemoglobin: 10.3 g/dL — ABNORMAL LOW (ref 12.0–15.0)
MCH: 27.1 pg (ref 26.0–34.0)
MCHC: 30.7 g/dL (ref 30.0–36.0)
MCV: 88.4 fL (ref 80.0–100.0)
Platelets: 173 10*3/uL (ref 150–400)
RBC: 3.8 MIL/uL — ABNORMAL LOW (ref 3.87–5.11)
RDW: 18.2 % — ABNORMAL HIGH (ref 11.5–15.5)
WBC: 7.3 10*3/uL (ref 4.0–10.5)
nRBC: 0 % (ref 0.0–0.2)

## 2022-01-19 LAB — CULTURE, BLOOD (ROUTINE X 2)

## 2022-01-19 LAB — HEPARIN LEVEL (UNFRACTIONATED)
Heparin Unfractionated: 0.81 IU/mL — ABNORMAL HIGH (ref 0.30–0.70)
Heparin Unfractionated: 0.46 IU/mL (ref 0.30–0.70)

## 2022-01-19 LAB — PROTIME-INR
INR: 1.7 — ABNORMAL HIGH (ref 0.8–1.2)
Prothrombin Time: 20 seconds — ABNORMAL HIGH (ref 11.4–15.2)

## 2022-01-19 LAB — GLUCOSE, CAPILLARY
Glucose-Capillary: 103 mg/dL — ABNORMAL HIGH (ref 70–99)
Glucose-Capillary: 111 mg/dL — ABNORMAL HIGH (ref 70–99)
Glucose-Capillary: 123 mg/dL — ABNORMAL HIGH (ref 70–99)
Glucose-Capillary: 131 mg/dL — ABNORMAL HIGH (ref 70–99)
Glucose-Capillary: 92 mg/dL (ref 70–99)
Glucose-Capillary: 99 mg/dL (ref 70–99)

## 2022-01-19 LAB — PROCALCITONIN: Procalcitonin: 1.19 ng/mL

## 2022-01-19 LAB — APTT
aPTT: 58 seconds — ABNORMAL HIGH (ref 24–36)
aPTT: 61 seconds — ABNORMAL HIGH (ref 24–36)

## 2022-01-19 LAB — PHOSPHORUS: Phosphorus: 2.4 mg/dL — ABNORMAL LOW (ref 2.5–4.6)

## 2022-01-19 LAB — TROPONIN I (HIGH SENSITIVITY): Troponin I (High Sensitivity): 33 ng/L — ABNORMAL HIGH (ref <18)

## 2022-01-19 LAB — MAGNESIUM: Magnesium: 2 mg/dL (ref 1.7–2.4)

## 2022-01-19 LAB — TRIGLYCERIDES: Triglycerides: 184 mg/dL — ABNORMAL HIGH (ref <150)

## 2022-01-19 MED ORDER — SODIUM CHLORIDE 0.9 % IV SOLN
3.0000 g | Freq: Four times a day (QID) | INTRAVENOUS | Status: DC
  Administered 2022-01-19 – 2022-01-20 (×4): 3 g via INTRAVENOUS
  Filled 2022-01-19 (×2): qty 8
  Filled 2022-01-19: qty 3
  Filled 2022-01-19: qty 8
  Filled 2022-01-19: qty 3

## 2022-01-19 MED ORDER — ASPIRIN 300 MG RE SUPP
300.0000 mg | Freq: Once | RECTAL | Status: AC
  Administered 2022-01-19: 300 mg via RECTAL
  Filled 2022-01-19: qty 1

## 2022-01-19 MED ORDER — HEPARIN (PORCINE) 25000 UT/250ML-% IV SOLN
1200.0000 [IU]/h | INTRAVENOUS | Status: DC
  Administered 2022-01-19 – 2022-01-22 (×4): 1100 [IU]/h via INTRAVENOUS
  Administered 2022-01-23: 1200 [IU]/h via INTRAVENOUS
  Filled 2022-01-19 (×5): qty 250

## 2022-01-19 MED ORDER — FENTANYL CITRATE PF 50 MCG/ML IJ SOSY
12.5000 ug | PREFILLED_SYRINGE | INTRAMUSCULAR | Status: DC | PRN
  Administered 2022-01-19 – 2022-01-23 (×15): 12.5 ug via INTRAVENOUS
  Filled 2022-01-19 (×14): qty 1

## 2022-01-19 MED ORDER — MIDAZOLAM HCL 2 MG/2ML IJ SOLN
1.0000 mg | INTRAMUSCULAR | Status: DC | PRN
  Filled 2022-01-19: qty 2

## 2022-01-19 MED ORDER — FENTANYL 2500MCG IN NS 250ML (10MCG/ML) PREMIX INFUSION
0.0000 ug/h | INTRAVENOUS | Status: DC
  Administered 2022-01-19: 25 ug/h via INTRAVENOUS
  Filled 2022-01-19: qty 250

## 2022-01-19 MED ORDER — ASPIRIN 81 MG PO CHEW: 324.0000 mg | CHEWABLE_TABLET | Freq: Once | ORAL | Status: DC

## 2022-01-19 MED ORDER — POTASSIUM CHLORIDE 20 MEQ PO PACK: 20.0000 meq | PACK | Freq: Once | ORAL | Status: DC

## 2022-01-19 MED ORDER — ASPIRIN 300 MG RE SUPP
150.0000 mg | Freq: Every day | RECTAL | Status: DC
  Administered 2022-01-20 – 2022-01-23 (×4): 150 mg via RECTAL
  Filled 2022-01-19 (×4): qty 1

## 2022-01-19 MED ORDER — ASPIRIN 81 MG PO CHEW
81.0000 mg | CHEWABLE_TABLET | Freq: Every day | ORAL | Status: DC
Start: 2022-01-20 — End: 2022-01-19

## 2022-01-19 MED ORDER — POTASSIUM CHLORIDE 10 MEQ/100ML IV SOLN
10.0000 meq | Freq: Once | INTRAVENOUS | Status: AC
  Administered 2022-01-19: 10 meq via INTRAVENOUS
  Filled 2022-01-19: qty 100

## 2022-01-19 MED ORDER — MIDAZOLAM HCL 2 MG/2ML IJ SOLN
1.0000 mg | INTRAMUSCULAR | Status: DC | PRN
  Administered 2022-01-19 – 2022-01-20 (×4): 1 mg via INTRAVENOUS
  Filled 2022-01-19 (×3): qty 2

## 2022-01-19 NOTE — Progress Notes (Signed)
ANTICOAGULATION CONSULT NOTE  Pharmacy Consult for heparin infusion Indication: atrial fibrillation  Allergies  Allergen Reactions   Flexeril [Cyclobenzaprine Hcl]     Severe Rash   Vancomycin Rash    Red Mans Syndrome    Patient Measurements: Height: 5' 7.01" (170.2 cm) Weight: 78.8 kg (173 lb 11.6 oz) IBW/kg (Calculated) : 61.62 Heparin Dosing Weight: 75.8 kg  Vital Signs: Temp: 100.3 F (37.9 C) (05/30 1930) Temp Source: Axillary (05/30 1930) BP: 77/48 (05/30 1900) Pulse Rate: 70 (05/30 1900)  Labs: Recent Labs    01/16/22 2342 01/17/22 0549 01/18/22 0357 01/18/22 1754 01/19/22 0030 01/19/22 0904 01/19/22 1943  HGB 12.0 10.0* 12.1  --   --  10.3*  --   HCT 40.5 35.2* 40.4  --   --  33.6*  --   PLT 180 204 194  --   --  173  --   APTT  --   --   --   --  58*  --  61*  LABPROT  --   --   --   --  20.0*  --   --   INR  --   --   --   --  1.7*  --   --   HEPARINUNFRC  --   --   --   --  0.81*  --  0.46  CREATININE 1.50* 1.34* 1.36*  --   --  1.02*  --   TROPONINIHS 62* 66*  --  53*  --   --   --      Estimated Creatinine Clearance: 39.6 mL/min (A) (by C-G formula based on SCr of 1.02 mg/dL (H)).   Medical History: Past Medical History:  Diagnosis Date   Acute postoperative pain 02/09/2018   Arrhythmia, sinus node 04/03/2014   Arthritis    Arthritis    Atrial fibrillation (HCC)    Back pain    lower back chronic   Bradycardia    Breast cancer ([place]) 2004   right breast cancer   Cancer ([place]) 2004   rt breast cancer-post lumpectomy- chemo/rad   CHF (congestive heart failure) (HCC)    Chronic back pain    CKD (chronic kidney disease)    COPD (chronic obstructive pulmonary disease) ([place])    wears O2 at 2L via [place] at night   COVID-19 06/11/2020   Cystocele    Decubitus ulcers    Dehydration    Gout    Hematuria    Hepatitis C    Hyperlipidemia    Hypertension    Hypomagnesemia 06/25/2015   NSTEMI (non-ST elevated myocardial infarction) ([place])  04/08/2021   OAB (overactive bladder)    Overactive bladder    Overdose opiate, accidental or unintentional, sequela 10/02/2020   Restless leg    Shoulder pain, bilateral    Urinary frequency    UTI (lower urinary tract infection)    Vaginal atrophy   Heparin Dosing Weight: 75.8 kg  Medications:  PTA Med: Apixaban 2.5 mg BID last dose 5/27 (AM) Pt given Lovenox 75 mg 5/29 @ 1500  Assessment: Pt is 88 yo female being transitioned from therapeutic Lovenox to Heparin for A. Fib.  Date Time aPTT/HL Rate/Comment 5/30 0030 58s / 0.81 Labs prior to UFH gtt; (last dose of lovenox 75mg  5/29 1500) 5/30 1945 61s / 0.46 Therapeutic HL; correlation noted. 1100 un/hr     Baseline Labs: aPTT - unk INR - 1.7 Hgb - 12>10.3 Plts - 180>173  Goal of Therapy:  Heparin level 0.3-0.7 units/ml aPTT 66-102 seconds Monitor platelets by anticoagulation protocol: Yes   Plan:  Heparin level therapeutic 0.46. Heparin level and aPTT appear to be correlating; will titrate by HL hereafter. Continue heparin infusion at 1100 units/hr. Will check HL in 8 hr to confirm therapeutic at current infusion rate. HL & CBC daily while on heparin.  Lorna Dibble, PharmD, [facility] Clinical Pharmacist 01/19/2022 8:28 PM

## 2022-01-19 NOTE — Progress Notes (Signed)
PHARMACY NOTE:  ANTIMICROBIAL RENAL DOSAGE ADJUSTMENT  Current antimicrobial regimen includes a mismatch between antimicrobial dosage and estimated renal function.  As per policy approved by the Pharmacy & Therapeutics and Medical Executive Committees, the antimicrobial dosage will be adjusted accordingly.  Current antimicrobial dosage:  Ampicillin-Sulbactam (UNASYN) 3 g IV every 12 hours  Indication: PNA  Renal Function:  Estimated Creatinine Clearance: 39.6 mL/min (A) (by C-G formula based on SCr of 1.02 mg/dL (H)). []      On intermittent HD, scheduled: []      On CRRT    Antimicrobial dosage has been changed to:  Ampicillin-Sulbactam (UNASYN) 3 g IV every 6 hours   Thank you for allowing pharmacy to be a part of this patient's care.  Dallie Piles, [facility] 01/19/2022 2:25 PM

## 2022-01-19 NOTE — Progress Notes (Signed)
ANTICOAGULATION CONSULT NOTE  Pharmacy Consult for heparin infusion Indication: atrial fibrillation  Allergies  Allergen Reactions   Flexeril [Cyclobenzaprine Hcl]     Severe Rash   Vancomycin Rash    Red [place] Syndrome    Patient Measurements: Height: 5' 7.01" (170.2 cm) Weight: 75.8 kg (167 lb 1.7 oz) IBW/kg (Calculated) : 61.62 Heparin Dosing Weight: 75.8 kg  Vital Signs: Temp: 99.3 F (37.4 C) (05/29 2000) Temp Source: Oral (05/29 2000) BP: 119/61 (05/29 2215) Pulse Rate: 75 (05/29 2215)  Labs: Recent Labs    01/16/22 2342 01/17/22 0549 01/18/22 0357 01/18/22 1754  HGB 12.0 10.0* 12.1  --   HCT 40.5 35.2* 40.4  --   PLT 180 204 194  --   CREATININE 1.50* 1.34* 1.36*  --   TROPONINIHS 62* 66*  --  53*    Estimated Creatinine Clearance: 29.2 mL/min (A) (by C-G formula based on SCr of 1.36 mg/dL (H)).   Medical History: Past Medical History:  Diagnosis Date   Acute postoperative pain 02/09/2018   Arrhythmia, sinus node 04/03/2014   Arthritis    Arthritis    Atrial fibrillation (HCC)    Back pain    lower back chronic   Bradycardia    Breast cancer ([place]) 2004   right breast cancer   Cancer ([place]) 2004   rt breast cancer-post lumpectomy- chemo/rad   CHF (congestive heart failure) (HCC)    Chronic back pain    CKD (chronic kidney disease)    COPD (chronic obstructive pulmonary disease) ([place])    wears O2 at 2L via [place] at night   COVID-19 06/11/2020   Cystocele    Decubitus ulcers    Dehydration    Gout    Hematuria    Hepatitis C    Hyperlipidemia    Hypertension    Hypomagnesemia 06/25/2015   NSTEMI (non-ST elevated myocardial infarction) ([place]) 04/08/2021   OAB (overactive bladder)    Overactive bladder    Overdose opiate, accidental or unintentional, sequela 10/02/2020   Restless leg    Shoulder pain, bilateral    Urinary frequency    UTI (lower urinary tract infection)    Vaginal atrophy     Medications:  PTA Med: Apixaban 2.5 mg BID last  dose 5/27 (AM) Pt given Lovenox 75 mg 5/29 @ 1500  Assessment: Pt is 88 yo female being transitioned from therapeutic Lovenox to Heparin for A. Fib.  Goal of Therapy:  Heparin level 0.3-0.7 units/ml aPTT 66-102 seconds Monitor platelets by anticoagulation protocol: Yes   Plan:  Will start heparin infusion (no initial bolus) at 1100 units/hr, 4 hrs prior to next Lovenox injection due. Will follow aPTT until correlation with HL is confirmed Will check aPTT in 8 hr after start of infusion HL & CBC daily while on heparin.  Renda Rolls, PharmD, MBA 01/19/2022 12:22 AM

## 2022-01-19 NOTE — Progress Notes (Signed)
NAME:  Kristin [name], MRN:  [id number], DOB:  1930/03/06, LOS: 2 ADMISSION DATE:  01/16/2022, CONSULTATION DATE: 01/17/2022 REFERRING MD: Dr. Damita Dunnings, CHIEF COMPLAINT: Weakness  History of Present Illness:  88 year old female presenting to [facility] ED from home via EMS on 01/16/2022 with complaints of lethargy and weakness.  Per the patient's granddaughter bedside, [name], patient was in her normal state of health until earlier in the evening on 01/16/2022 when she picked up the phone but did not respond.  Patient's granddaughter lives next door and walked over to check on her finding her sitting at the kitchen table with her head on the table.  She was responsive and able to talk but very weak.  Only other thing family noticed was that the patient was sleeping more than usual.  No other complaints that family was aware of.  01/18/22- patient was unable to get NGT advaced by RN staff after multiple attempts. We will try again with direct visual utilzing GLideScope.  Patient is making attempts at opening eyes and having spontaneous non jerky movements. Family in room we met and reivewed medical plan.    01/19/22- patient on SBT today, mentation still lethargic.  Weaning centrally acting meds for mental status. Renal function markedly improved. Reviewed plan with oncologist plan for repeat neuroimaging with CTA head  Pertinent  Medical History  Atrial fibrillation on Eliquis Right breast cancer 2004 HFpEF COPD COVID-19 06/11/2020 Gout Hepatitis C Hyperlipidemia Hypertension NSTEMI 04/08/2021 SCC lung status post palliative radiation SBRT x5 fractions PPM in place Significant Hospital Events: Including procedures, antibiotic start and stop dates in addition to other pertinent events   01/17/2022: Admit to ICU with acute hypoxic respiratory failure secondary to suspected aspiration in the setting of acute encephalopathy    Objective   Blood pressure (!) 123/96, pulse 67, temperature 98.8 F  (37.1 C), temperature source Oral, resp. rate 17, height 5' 7.01" (1.702 m), weight 78.8 kg, SpO2 97 %.    Vent Mode: PSV;CPAP FiO2 (%):  [28 %-50 %] 35 % Set Rate:  [20 bmp] 20 bmp Vt Set:  [470 mL] 470 mL PEEP:  [5 cmH20] 5 cmH20 Pressure Support:  [5 cmH20-10 cmH20] 10 cmH20   Intake/Output Summary (Last 24 hours) at 01/19/2022 1038 Last data filed at 01/19/2022 1000 Gross per 24 hour  Intake 1978.36 ml  Output 1585 ml  Net 393.36 ml    Filed Weights   01/17/22 0455 01/19/22 0500  Weight: 75.8 kg 78.8 kg    Examination: General: Adult female, critically ill, lying in bed, lethargic, NAD HEENT: MM pink/moist, anicteric, atraumatic, neck supple Neuro: Unresponsive, RASS -1, unable to follow commands, PERRL +2, no movement to painful stimuli in any extremity CV: s1s2 RRR, NSR paced rhythm on monitor, no r/m/g Pulm: Regular, non labored on NRB 15 L, breath sounds coarse crackles-BUL & diminished-BLL GI: soft, rounded, non tender, bs x 4 GU: foley in place with clear yellow urine Skin: Limited exam- no rashes/lesions noted Extremities: warm/dry, pulses + 2 R/P, +2 edema noted BLE  Resolved Hospital Problem list     Assessment & Plan:  Acute Hypoxic / Hypercapnic Respiratory Failure secondary to suspected aspiration in the setting of possible CAP & acute HFpEF exacerbation  - Ventilator settings: PRVC  8 mL/kg, 60% FiO2, 5 PEEP, continue ventilator support & lung protective strategies - Wean PEEP & FiO2 as tolerated, maintain SpO2 > 90% - Head of bed elevated 30 degrees, VAP protocol in place - Plateau pressures less than  30 cm H20  - Intermittent chest x-ray & ABG PRN - Daily WUA with SBT as tolerated  - Ensure adequate pulmonary hygiene  - F/u cultures, trend PCT -Broadened CAP/aspiration coverage: Zosyn & vancomycin - bronchodilators PRN - PAD protocol in place: continue Fentanyl IVP   Suspected acute on Chronic HFpEF exacerbation Elevated Troponin secondary to  suspected demand ischemia versus NSTEMI Chronic / Paroxysmal Atrial Fibrillation Troponin: 62 BNP: Pending - Trend troponins  - Echocardiogram ordered - f/u BNP - Continuous cardiac monitoring  - Daily weights to assess volume status - Diurese with the use of IV lasix, as hemodynamics and renal function permit -Continue outpatient Eliquis  Acute Kidney Injury superimposed on CKD stage 3b  Baseline Cr: 1.18, Cr on admission:1.50 - Strict I/O's: alert provider if UOP < 0.5 mL/kg/hr - Daily BMP, replace electrolytes PRN - Avoid nephrotoxic agents as able, ensure adequate renal perfusion  Acute encephalopathy secondary to unknown etiology -Stat CT head without contrast - f/u ammonia level -Consider EEG/MRI brain if mentation does not improve with oxygenation support -Neurochecks every 4 -Avoid sedating medications as able - supportive care  Best Practice (right click and "Reselect all SmartList Selections" daily)  Diet/type: NPO w/ meds via tube DVT prophylaxis: DOAC GI prophylaxis: PPI Lines: N/A Foley:  Yes, and it is still needed Code Status:  full code Last date of multidisciplinary goals of care discussion [01/16/22] Updated granddaughter, HCPOA, via telephone.  Questions and concerns answered at this time.  Labs   CBC: Recent Labs  Lab 01/16/22 2342 01/17/22 0549 01/18/22 0357 01/19/22 0904  WBC 4.4 6.7 8.7 7.3  NEUTROABS 3.0  --   --   --   HGB 12.0 10.0* 12.1 10.3*  HCT 40.5 35.2* 40.4 33.6*  MCV 90.4 92.4 91.2 88.4  PLT 180 204 194 173     Basic Metabolic Panel: Recent Labs  Lab 01/16/22 2342 01/17/22 0549 01/18/22 0357 01/19/22 0904  NA 135 136 136 138  K 4.8 4.1 4.0 3.4*  CL 104 106 104 108  CO2 _0 GLUCOSE 120* 81 137* 110*  BUN _1 CREATININE 1.50* 1.34* 1.36* 1.02*  CALCIUM 9.0 8.6* 8.8* 8.5*  MG  --  2.1 2.1 2.0  PHOS  --  5.0* 2.9 2.4*    GFR: Estimated Creatinine Clearance: 39.6 mL/min (A) (by C-G formula based on  SCr of 1.02 mg/dL (H)). Recent Labs  Lab 01/16/22 2342 01/16/22 2343 01/17/22 0549 01/17/22 2256 01/18/22 0357 01/19/22 0904  PROCALCITON  --   --  0.21  --  1.07  --   WBC 4.4  --  6.7  --  8.7 7.3  LATICACIDVEN  --  1.5  --  0.9  --   --      Liver Function Tests: Recent Labs  Lab 01/16/22 2342  AST 41  ALT 17  ALKPHOS 81  BILITOT 1.2  PROT 8.3*  ALBUMIN 3.3*    No results for input(s): LIPASE, AMYLASE in the last 168 hours. Recent Labs  Lab 01/17/22 0549  AMMONIA 46*     ABG    Component Value Date/Time   PHART 7.28 (L) 01/17/2022 0800   PCO2ART 49 (H) 01/17/2022 0800   PO2ART 76 (L) 01/17/2022 0800   HCO3 23.0 01/17/2022 0800   ACIDBASEDEF 4.1 (H) 01/17/2022 0800   O2SAT 96.1 01/17/2022 0800      Coagulation Profile: Recent Labs  Lab 01/19/22 0030  INR 1.7*  Cardiac Enzymes: No results for input(s): CKTOTAL, CKMB, CKMBINDEX, TROPONINI in the last 168 hours.  HbA1C: Hgb A1c MFr Bld  Date/Time Value Ref Range Status  11/05/2021 02:03 PM 6.1 (H) 4.8 - 5.6 % Final    Comment:    (NOTE)         Prediabetes: 5.7 - 6.4         Diabetes: >6.4         Glycemic control for adults with diabetes: <7.0   04/01/2020 08:07 AM 5.5 <5.7 % of total Hgb Final    Comment:    For the purpose of screening for the presence of diabetes: . <5.7%       Consistent with the absence of diabetes 5.7-6.4%    Consistent with increased risk for diabetes             (prediabetes) > or =6.5%  Consistent with diabetes . This assay result is consistent with a decreased risk of diabetes. . Currently, no consensus exists regarding use of hemoglobin A1c for diagnosis of diabetes in children. . According to American Diabetes Association (ADA) guidelines, hemoglobin A1c <7.0% represents optimal control in non-pregnant diabetic patients. Different metrics may apply to specific patient populations.  Standards of Medical Care in Diabetes(ADA). .     CBG: Recent  Labs  Lab 01/18/22 1532 01/18/22 1927 01/18/22 2320 01/19/22 0331 01/19/22 0749  GLUCAP 113* 103* 133* 131* 123*     Review of Systems:   UTA patient unresponsive unable to participate in interview  Past Medical History:  She,  has a past medical history of Acute postoperative pain (02/09/2018), Arrhythmia, sinus node (04/03/2014), Arthritis, Arthritis, Atrial fibrillation (HCC), Back pain, Bradycardia, Breast cancer (HCC) (2004), Cancer (HCC) (2004), CHF (congestive heart failure) (HCC), Chronic back pain, CKD (chronic kidney disease), COPD (chronic obstructive pulmonary disease) (HCC), COVID-19 (06/11/2020), Cystocele, Decubitus ulcers, Dehydration, Gout, Hematuria, Hepatitis C, Hyperlipidemia, Hypertension, Hypomagnesemia (06/25/2015), NSTEMI (non-ST elevated myocardial infarction) (HCC) (04/08/2021), OAB (overactive bladder), Overactive bladder, Overdose opiate, accidental or unintentional, sequela (10/02/2020), Restless leg, Shoulder pain, bilateral, Urinary frequency, UTI (lower urinary tract infection), and Vaginal atrophy.   Surgical History:   Past Surgical History:  Procedure Laterality Date   ABDOMINAL HYSTERECTOMY     BACK SURGERY     BREAST LUMPECTOMY Right 2004   BREAST SURGERY     rt lumpectomy   CARPAL TUNNEL RELEASE     CATARACT EXTRACTION W/PHACO  10/19/2011   Procedure: CATARACT EXTRACTION PHACO AND INTRAOCULAR LENS PLACEMENT (IOC);  Surgeon: Mark T. Shapiro, MD;  Location: [facility];  Service: Ophthalmology;  Laterality: Right;  CDE:10.81   CATARACT EXTRACTION W/PHACO  11/02/2011   Procedure: CATARACT EXTRACTION PHACO AND INTRAOCULAR LENS PLACEMENT (IOC);  Surgeon: Mark T. Shapiro, MD;  Location: [facility];  Service: Ophthalmology;  Laterality: Left;  CDE 8.60   CHOLECYSTECTOMY     3/18   COLON SURGERY     INSERT / REPLACE / REMOVE PACEMAKER     JOINT REPLACEMENT     bilateral TKA   LOWER EXTREMITY ANGIOGRAPHY Left 10/14/2020   Procedure: LOWER EXTREMITY ANGIOGRAPHY;   Surgeon: Schnier, Gregory G, MD;  Location: [facility];  Service: Cardiovascular;  Laterality: Left;   LOWER EXTREMITY ANGIOGRAPHY Right 09/08/2021   Procedure: LOWER EXTREMITY ANGIOGRAPHY;  Surgeon: Schnier, Gregory G, MD;  Location: [facility];  Service: Cardiovascular;  Laterality: Right;   PACEMAKER INSERTION N/A 12/24/2015   Procedure: INSERTION PACEMAKER;  Surgeon: Alexander Paraschos, MD;  Location:   [facility];  Service: Cardiovascular;  Laterality: N/A;   ROTATOR CUFF REPAIR     bilateral   VIDEO BRONCHOSCOPY WITH ENDOBRONCHIAL NAVIGATION Right 11/11/2021   Procedure: VIDEO BRONCHOSCOPY WITH ENDOBRONCHIAL NAVIGATION;  Surgeon: Ottie Glazier, MD;  Location: [facility];  Service: Thoracic;  Laterality: Right;     Social History:   reports that she quit smoking about 23 years ago. Her smoking use included cigarettes. She has a 50.00 pack-year smoking history. She has never used smokeless tobacco. She reports current drug use. Drug: Oxycodone. She reports that she does not drink alcohol.   Family History:  Her family history includes Breast cancer in her paternal aunt; Breast cancer (age of onset: 15) in her sister; Cancer in her sister; Heart disease in her father and mother; Kidney disease in her brother; Stroke in her mother. There is no history of Anesthesia problems, Hypotension, Malignant hyperthermia, or Pseudochol deficiency.   Allergies Allergies  Allergen Reactions   Flexeril [Cyclobenzaprine Hcl]     Severe Rash   Vancomycin Rash    [place] Syndrome     Home Medications  Prior to Admission medications   Medication Sig Start Date End Date Taking? Authorizing Provider  acetaminophen (TYLENOL) 500 MG tablet Take 500 mg by mouth in the morning and at bedtime.   Yes [provider]  apixaban (ELIQUIS) 2.5 MG TABS tablet Take 1 tablet (2.5 mg total) by mouth 2 (two) times daily. 04/09/21  Yes Wieting, [name], MD  atorvastatin (LIPITOR) 20 MG tablet TAKE  1 TABLET BY MOUTH ONCE DAILY FOR CHOLESTEROL Patient taking differently: Take 20 mg by mouth daily. 02/17/21  Yes Karamalegos, Alexander J, DO  BREZTRI AEROSPHERE 160-9-4.8 MCG/ACT AERO Inhale 2 puffs into the lungs 2 (two) times daily. 07/07/21  Yes Karamalegos, Devonne Doughty, DO  Cholecalciferol (VITAMIN D) 2000 units tablet Take 2,000 Units by mouth daily.    Yes [provider]  empagliflozin (JARDIANCE) 10 MG TABS tablet Take 1 tablet (10 mg total) by mouth daily before breakfast. 12/29/21  Yes [name] A, FNP  furosemide (LASIX) 40 MG tablet Take 1 tablet (40 mg total) by mouth daily. 09/23/21  Yes Nicole Kindred A, DO  gabapentin (NEURONTIN) 300 MG capsule Take 1 capsule (300 mg total) by mouth 3 (three) times daily. 11/12/21  Yes Annita Brod, MD  GNP ASPIRIN LOW DOSE 81 MG EC tablet TAKE 1 TABLET BY MOUTH ONCE DAILY. *SWALLOW WHOLE* 08/18/21  Yes Schnier, Dolores Lory, MD  levocetirizine (XYZAL) 5 MG tablet TAKE (1/2) TABLET BY MOUTH EVERY OTHER DAY. 06/19/21  Yes Karamalegos, Devonne Doughty, DO  Melatonin 10 MG TABS Take 10 mg by mouth at bedtime.   Yes [provider]  Multiple Vitamin (MULTIVITAMIN WITH MINERALS) TABS tablet Take 1 tablet by mouth daily. 11/13/21  Yes Annita Brod, MD  oxyCODONE (OXY IR/ROXICODONE) 5 MG immediate release tablet Take 2 tablets (10 mg total) by mouth 2 (two) times daily. May also take 1 tablet (5 mg total) at bedtime as needed for severe pain. Must last 30 days.. 12/23/21 01/22/22 Yes Milinda Pointer, MD  rOPINIRole (REQUIP) 2 MG tablet TAKE 2 TABLETS BY MOUTH AT BEDTIME 10/21/21  Yes Karamalegos, Alexander J, DO  albuterol (PROVENTIL) (2.5 MG/3ML) 0.083% nebulizer solution USE 1 VIAL IN NEBULIZER EVERY 8 HOURS AS NEEDED FOR WHEEZING OR SHORTNESS OF BREATH. 12/22/21   Karamalegos, Devonne Doughty, DO  bisacodyl (DULCOLAX) 5 MG EC tablet Take 2 tablets (10 mg total) by mouth daily  as needed for moderate constipation. 07/04/20   Karamalegos,  Devonne Doughty, DO  diclofenac Sodium (VOLTAREN) 1 % GEL Apply 4 g topically 2 (two) times daily as needed (As needed for pain). 10/15/21   Kris Hartmann, NP  feeding supplement (ENSURE ENLIVE / ENSURE PLUS) LIQD Take 237 mLs by mouth 2 (two) times daily between meals. 11/13/21   Annita Brod, MD  oxyCODONE (OXY IR/ROXICODONE) 5 MG immediate release tablet Take 2 tablets (10 mg total) by mouth 2 (two) times daily. May also take 1 tablet (5 mg total) at bedtime as needed for severe pain. Must last 30 days.. 01/22/22 02/21/22  Milinda Pointer, MD  oxyCODONE (OXY IR/ROXICODONE) 5 MG immediate release tablet Take 2 tablets (10 mg total) by mouth 2 (two) times daily. May also take 1 tablet (5 mg total) at bedtime as needed for severe pain. Must last 30 days.. 02/21/22 03/23/22  Milinda Pointer, MD  OXYGEN Inhale 2 L into the lungs daily.    [provider]    Critical care provider statement:   Total critical care time: 33 minutes   Performed by: Lanney Gins MD   Critical care time was exclusive of separately billable procedures and treating other patients.   Critical care was necessary to treat or prevent imminent or life-threatening deterioration.   Critical care was time spent personally by me on the following activities: development of treatment plan with patient and/or surrogate as well as nursing, discussions with consultants, evaluation of patient's response to treatment, examination of patient, obtaining history from patient or surrogate, ordering and performing treatments and interventions, ordering and review of laboratory studies, ordering and review of radiographic studies, pulse oximetry and re-evaluation of patient's condition.    Ottie Glazier, M.D.  Pulmonary & Critical Care Medicine

## 2022-01-19 NOTE — Consult Note (Signed)
[place] NOTE  Patient Care Team: [name], MD as PCP - General (Pulmonary Disease) Parks Ranger Devonne Doughty, DO as Consulting Physician (Family Medicine) Isaias Cowman, MD as Consulting Physician (Cardiology) Milinda Pointer, MD as Referring Physician (Pain Medicine) Alisa Graff, FNP as Nurse Practitioner (Family Medicine) Florance, Tomasa Blase, RN as [facility] [place], [place], RN as Oncology Nurse Navigator Vanita Ingles, RN as Case Manager Rebekah Chesterfield, LCSW as Education officer, museum (Licensed Holiday representative)  [place]: Lung cancer acute on chronic respiratory failure  HISTORY OF PRESENTING ILLNESS: Patient family prior to bedside. Kristin [name] 88 y.o.  female multiple medical problems including history of chronic respiratory failure CHF COPD on home O2; diagnosis of limited stage small cell lung cancer is currently admitted to hospital for worsening shortness of breath/mental status changes.  As per the family patient has been in her usual state of health until 1 day prior to admission when patient noted to have acute mental status changes.  Patient was also to be hypoxic.  Chest suggestive of multifocal pneumonia.  Patient started on IV fluids and antibiotics.  However patient was subsequently intubated for airway protection.  Patient continues to be on pressors; IV heparin given history of A-fib.   Review of Systems  Unable to perform ROS: Intubated    MEDICAL HISTORY:  Past Medical History:  Diagnosis Date   Acute postoperative pain 02/09/2018   Arrhythmia, sinus node 04/03/2014   Arthritis    Arthritis    Atrial fibrillation (HCC)    Back pain    lower back chronic   Bradycardia    Breast cancer ([place]) 2004   right breast cancer   Cancer ([place]) 2004   rt breast cancer-post lumpectomy- chemo/rad   CHF (congestive heart failure) (HCC)    Chronic back pain     CKD (chronic kidney disease)    COPD (chronic obstructive pulmonary disease) ([place])    wears O2 at 2L via [place] at night   COVID-19 06/11/2020   Cystocele    Decubitus ulcers    Dehydration    Gout    Hematuria    Hepatitis C    Hyperlipidemia    Hypertension    Hypomagnesemia 06/25/2015   NSTEMI (non-ST elevated myocardial infarction) ([place]) 04/08/2021   OAB (overactive bladder)    Overactive bladder    Overdose opiate, accidental or unintentional, sequela 10/02/2020   Restless leg    Shoulder pain, bilateral    Urinary frequency    UTI (lower urinary tract infection)    Vaginal atrophy     SURGICAL HISTORY: Past Surgical History:  Procedure Laterality Date   ABDOMINAL HYSTERECTOMY     BACK SURGERY     BREAST LUMPECTOMY Right 2004   BREAST SURGERY     rt lumpectomy   CARPAL TUNNEL RELEASE     CATARACT EXTRACTION W/PHACO  10/19/2011   Procedure: CATARACT EXTRACTION PHACO AND INTRAOCULAR LENS PLACEMENT ([place]);  Surgeon: Elta Guadeloupe T. Gershon Crane, MD;  Location: [facility];  Service: Ophthalmology;  Laterality: Right;  CDE:10.81   CATARACT EXTRACTION W/PHACO  11/02/2011   Procedure: CATARACT EXTRACTION PHACO AND INTRAOCULAR LENS PLACEMENT ([facility]);  Surgeon: Elta Guadeloupe T. Gershon Crane, MD;  Location: [facility];  Service: Ophthalmology;  Laterality: Left;  CDE 8.60   CHOLECYSTECTOMY     3/18   COLON SURGERY     INSERT / REPLACE / REMOVE PACEMAKER     JOINT REPLACEMENT  bilateral TKA   LOWER EXTREMITY ANGIOGRAPHY Left 10/14/2020   Procedure: LOWER EXTREMITY ANGIOGRAPHY;  Surgeon: Katha Cabal, MD;  Location: [place] [facility];  Service: Cardiovascular;  Laterality: Left;   LOWER EXTREMITY ANGIOGRAPHY Right 09/08/2021   Procedure: LOWER EXTREMITY ANGIOGRAPHY;  Surgeon: Katha Cabal, MD;  Location: [place] [facility];  Service: Cardiovascular;  Laterality: Right;   PACEMAKER INSERTION N/A 12/24/2015   Procedure: INSERTION PACEMAKER;  Surgeon: Isaias Cowman, MD;  Location: [facility];  Service:  Cardiovascular;  Laterality: N/A;   ROTATOR CUFF REPAIR     bilateral   VIDEO BRONCHOSCOPY WITH ENDOBRONCHIAL NAVIGATION Right 11/11/2021   Procedure: VIDEO BRONCHOSCOPY WITH ENDOBRONCHIAL NAVIGATION;  Surgeon: Ottie Glazier, MD;  Location: [facility];  Service: Thoracic;  Laterality: Right;    SOCIAL HISTORY: Social History   Socioeconomic History   Marital status: Single    Spouse name: Not on file   Number of children: 2   Years of education: 12   Highest education level: 12th grade  Occupational History   Occupation: retired  Tobacco Use   Smoking status: Former    Packs/day: 1.25    Years: 40.00    Pack years: 50.00    Types: Cigarettes    Quit date: 12/16/1998    Years since quitting: 23.1   Smokeless tobacco: Never  Vaping Use   Vaping Use: Never used  Substance and Sexual Activity   Alcohol use: No    Alcohol/week: 0.0 standard drinks   Drug use: Yes    Types: Oxycodone   Sexual activity: Not Currently    Birth control/protection: Surgical  Other Topics Concern   Not on file  Social History Narrative   Not on file   Social Determinants of Health   Financial Resource Strain: Low Risk    Difficulty of Paying Living Expenses: Not hard at all  Food Insecurity: No Food Insecurity   Worried About Charity fundraiser in the Last Year: Never true   [place] in the Last Year: Never true  Transportation Needs: No Transportation Needs   Lack of Transportation (Medical): No   Lack of Transportation (Non-Medical): No  Physical Activity: Insufficiently Active   Days of Exercise per Week: 2 days   Minutes of Exercise per Session: 20 min  Stress: No Stress Concern Present   Feeling of Stress : Not at all  Social Connections: Socially Isolated   Frequency of Communication with Friends and Family: Three times a week   Frequency of Social Gatherings with Friends and Family: Twice a week   Attends Religious Services: Never   Marine scientist or  Organizations: No   Attends Music therapist: Never   Marital Status: Never married  Human resources officer Violence: Not At Risk   Fear of Current or Ex-Partner: No   Emotionally Abused: No   Physically Abused: No   Sexually Abused: No    FAMILY HISTORY: Family History  Problem Relation Age of Onset   Kidney disease Brother    Heart disease Father    Heart disease Mother    Stroke Mother    Cancer Sister    Breast cancer Sister 72   Breast cancer Paternal Aunt    Anesthesia problems Neg Hx    Hypotension Neg Hx    Malignant hyperthermia Neg Hx    Pseudochol deficiency Neg Hx     ALLERGIES:  is allergic to flexeril [cyclobenzaprine hcl] and vancomycin.  MEDICATIONS:  Current  Facility-Administered Medications  Medication Dose Route Frequency Provider Last Rate Last Admin   0.9 %  sodium chloride infusion   Intravenous PRN Rust-Chester, Huel Cote, NP   Stopped at 01/17/22 1742   0.9 %  sodium chloride infusion  250 mL Intravenous Continuous Rust-Chester, Huel Cote, NP       acetaminophen (TYLENOL) tablet 650 mg  650 mg Per Tube Q6H PRN Rust-Chester, Huel Cote, NP       Or   acetaminophen (TYLENOL) suppository 650 mg  650 mg Rectal Q6H PRN Rust-Chester, Huel Cote, NP       Ampicillin-Sulbactam (UNASYN) 3 g in sodium chloride 0.9 % 100 mL IVPB  3 g Intravenous Q6H Dallie Piles, RPH   Stopped at 01/19/22 7062   chlorhexidine gluconate (MEDLINE KIT) (PERIDEX) 0.12 % solution 15 mL  15 mL Mouth Rinse BID Rust-Chester, Huel Cote, NP   15 mL at 01/19/22 1933   Chlorhexidine Gluconate Cloth 2 % PADS 6 each  6 each Topical Daily [name], MD   6 each at 01/19/22 1206   dextrose 10 % infusion   Intravenous Continuous [name], MD 50 mL/hr at 01/19/22 2100 Infusion Verify at 01/19/22 2100   docusate (COLACE) 50 MG/5ML liquid 100 mg  100 mg Per Tube BID Rust-Chester, Huel Cote, NP       fentaNYL (SUBLIMAZE) injection 12.5 mcg  12.5 mcg Intravenous Q1H PRN Ottie Glazier, MD   12.5 mcg at 01/19/22 1806   fentaNYL 2553mg in NS 251m(1060mml) infusion-PREMIX  0-400 mcg/hr Intravenous Continuous AleOttie GlazierD 7.5 mL/hr at 01/19/22 2100 75 mcg/hr at 01/19/22 2100   heparin ADULT infusion 100 units/mL (25000 units/250m74m1,100 Units/hr Intravenous Continuous BeluRenda RollsH 11 mL/hr at 01/19/22 2100 1,100 Units/hr at 01/19/22 2100   ipratropium-albuterol (DUONEB) 0.5-2.5 (3) MG/3ML nebulizer solution 3 mL  3 mL Nebulization Q4H PRN Rust-Chester, BritHuel Cote       MEDLINE mouth rinse  15 mL Mouth Rinse 10 times per day Rust-Chester, BritHuel Cote   15 mL at 01/19/22 2138   midazolam (VERSED) injection 1 mg  1 mg Intravenous Q15 min PRN KeenBradly Bienenstock       midazolam (VERSED) injection 1 mg  1 mg Intravenous Q2H PRN KeenBradly Bienenstock   1 mg at 01/19/22 1956   pantoprazole sodium (PROTONIX) 40 mg/20 mL oral suspension 40 mg  40 mg Per Tube Daily Rust-Chester, BritHuel Cote       polyethylene glycol (MIRALAX / GLYCOLAX) packet 17 g  17 g Per Tube Daily Rust-Chester, Britton L, NP       sodium chloride flush (NS) 0.9 % injection 10-40 mL  10-40 mL Intracatheter Q12H BresNelle Don   10 mL at 01/19/22 2139   sodium chloride flush (NS) 0.9 % injection 10-40 mL  10-40 mL Intracatheter PRN BresNelle Don          .  PHYSICAL EXAMINATION:  Vitals:   01/19/22 2030 01/19/22 2100  BP: (!) 89/50 (!) 95/47  Pulse: 84 89  Resp: 15 16  Temp:    SpO2: 100% 100%   Filed Weights   01/17/22 0455 01/19/22 0500  Weight: 167 lb 1.7 oz (75.8 kg) 173 lb 11.6 oz (78.8 kg)    Physical Exam Vitals and nursing note reviewed.  Constitutional:      Comments: Intubated/sedated  HENT:     Head: Normocephalic and atraumatic.  Cardiovascular:  Rate and Rhythm: Regular rhythm. Tachycardia present.  Pulmonary:     Comments: Decreased breath sounds bilaterally.  Coarse breath sounds Abdominal:     General: Abdomen is flat.     Palpations:  Abdomen is soft.  Skin:    General: Skin is warm and dry.  Neurological:     Comments: Sedated     LABORATORY DATA:  I have reviewed the data as listed Lab Results  Component Value Date   WBC 7.3 01/19/2022   HGB 10.3 (L) 01/19/2022   HCT 33.6 (L) 01/19/2022   MCV 88.4 01/19/2022   PLT 173 01/19/2022   Recent Labs    11/05/21 1214 11/06/21 0517 11/08/21 0431 12/18/21 0628 12/19/21 0542 01/01/22 1434 01/16/22 2342 01/17/22 0549 01/18/22 0357 01/19/22 0904  NA 136 135   < > 136   < > 135 135 136 136 138  K 4.6 4.2   < > 4.4   < > 3.5 4.8 4.1 4.0 3.4*  CL 102 106   < > 107   < > 102 104 106 104 108  CO2 25 21*   < > 19*   < > '23 24 23 23 24  ' GLUCOSE 94 146*   < > 97   < > 116* 120* 81 137* 110*  BUN 33* 31*   < > 32*   < > '21 23 21 18 17  ' CREATININE 1.67* 1.20*   < > 1.28*   < > 1.18* 1.50* 1.34* 1.36* 1.02*  CALCIUM 9.1 8.6*   < > 8.6*   < > 9.0 9.0 8.6* 8.8* 8.5*  GFRNONAA 29* 43*   < > 40*   < > 44* 33* 38* 37* 52*  PROT 7.0 6.1*   < > 6.9  --  7.4 8.3*  --   --   --   ALBUMIN 3.8 3.0*   < > 3.1*  --  3.2* 3.3*  --   --   --   AST 74* 49*   < > 168*  --  26 41  --   --   --   ALT 63* 49*   < > 83*  --  14 17  --   --   --   ALKPHOS 106 92   < > 145*  --  76 81  --   --   --   BILITOT 1.1 0.9   < > 1.1  --  0.5 1.2  --   --   --   BILIDIR 0.3* 0.3*  --   --   --   --   --   --   --   --   IBILI 0.8 0.6  --   --   --   --   --   --   --   --    < > = values in this interval not displayed.    RADIOGRAPHIC STUDIES: I have personally reviewed the radiological images as listed and agreed with the findings in the report. CT HEAD WO CONTRAST (5MM)  Result Date: 01/18/2022 CLINICAL DATA:  Seizure, new onset, no history of trauma. EXAM: CT HEAD WITHOUT CONTRAST TECHNIQUE: Contiguous axial images were obtained from the base of the skull through the vertex without intravenous contrast. RADIATION DOSE REDUCTION: This exam was performed according to the departmental  dose-optimization program which includes automated exposure control, adjustment of the mA and/or kV according to patient size and/or use of iterative reconstruction technique. COMPARISON:  Yesterday FINDINGS: Brain: No evidence of acute infarction, hemorrhage, hydrocephalus, extra-axial collection or mass lesion/mass effect. Age congruent cerebral volume loss. Less than typical white matter low-density. Vascular: No hyperdense vessel or unexpected calcification. Skull: No acute finding.  Hyperostosis interna. Sinuses/Orbits: No acute finding.  Bilateral cataract resection. IMPRESSION: No acute or interval finding. Electronically Signed   By: Jorje Guild M.D.   On: 01/18/2022 06:33   CT HEAD WO CONTRAST (5MM)  Result Date: 01/17/2022 CLINICAL DATA:  Mental status change with unknown cause EXAM: CT HEAD WITHOUT CONTRAST TECHNIQUE: Contiguous axial images were obtained from the base of the skull through the vertex without intravenous contrast. RADIATION DOSE REDUCTION: This exam was performed according to the departmental dose-optimization program which includes automated exposure control, adjustment of the mA and/or kV according to patient size and/or use of iterative reconstruction technique. COMPARISON:  12/18/2021 FINDINGS: Brain: No evidence of acute infarction, hemorrhage, hydrocephalus, extra-axial collection or mass lesion/mass effect. Age expected brain volume. Mild chronic small vessel ischemic change in the hemispheric white matter Vascular: No hyperdense vessel or unexpected calcification. Skull: Retro dental ligamentous thickening contacting the cervicomedullary junction without high-grade foramen magnum stenosis. Sinuses/Orbits: No acute finding.  Bilateral cataract resection IMPRESSION: No acute finding.  Unremarkable appearance of the brain for age Electronically Signed   By: Jorje Guild M.D.   On: 01/17/2022 04:16   US Venous Img Lower Bilateral (DVT)  Result Date: 01/17/2022 CLINICAL  DATA:  History of deep venous thrombosis, history of lung and breast cancer EXAM: BILATERAL LOWER EXTREMITY VENOUS DOPPLER ULTRASOUND TECHNIQUE: Gray-scale sonography with compression, as well as color and duplex ultrasound, were performed to evaluate the deep venous system(s) from the level of the common femoral vein through the popliteal and proximal calf veins. COMPARISON:  09/18/2021 FINDINGS: VENOUS Normal compressibility of the common femoral, superficial femoral, and popliteal veins, as well as the visualized calf veins. Visualized portions of profunda femoral vein and great saphenous vein unremarkable. No filling defects to suggest DVT on grayscale or color Doppler imaging. Doppler waveforms show normal direction of venous flow, normal respiratory plasticity and response to augmentation. OTHER None. Limitations: none IMPRESSION: 1. No evidence of deep venous thrombosis within either lower extremity. Electronically Signed   By: Randa Ngo M.D.   On: 01/17/2022 20:06   DG Chest Port 1 View  Result Date: 01/19/2022 CLINICAL DATA:  Acute respiratory failure with hypoxia EXAM: PORTABLE CHEST 1 VIEW COMPARISON:  01/17/2022 chest radiograph. FINDINGS: Left rotated chest radiograph. Endotracheal tube tip is 2.3 cm above the carina. Right PICC terminates over the cavoatrial junction. Stable configuration of 2 lead left subclavian pacemaker. Partially visualized bilateral shoulder arthroplasty. Stable cardiomediastinal silhouette with mild cardiomegaly. No pneumothorax. No pleural effusion. Mild-to-moderate diffuse prominence of the parahilar interstitial markings, similar. IMPRESSION: 1. Well-positioned support structures. 2. Stable mild cardiomegaly and mild-to-moderate diffuse prominence of the parahilar interstitial markings, suggesting congestive heart failure. Electronically Signed   By: Ilona Sorrel M.D.   On: 01/19/2022 08:06   DG Chest Port 1 View  Result Date: 01/17/2022 CLINICAL DATA:  Status  post intubation. EXAM: PORTABLE CHEST 1 VIEW COMPARISON:  01/16/2022 FINDINGS: 0448 hours. Endotracheal tube tip is 2.6 cm above the base of the carina. Catheter tubing overlying the hypopharynx may be an NG tube coiled in the mouth and hypopharynx. The cardio pericardial silhouette is enlarged. Similar appearance diffuse interstitial opacity with patchy asymmetric airspace disease. No substantial pleural effusion. Bilateral shoulder replacement with left permanent pacemaker noted. IMPRESSION: 1.  Catheter tubing overlying the hypopharynx may reflect NG tube coiled in the mouth and hypopharynx. 2. Endotracheal tube tip 2.6 cm above the base of the carina. 3. Stable diffuse interstitial and patchy asymmetric bilateral airspace disease. These results will be called to the ordering clinician or representative by the Radiologist Assistant, and communication documented in the PACS or [employer]. Electronically Signed   By: Misty Stanley M.D.   On: 01/17/2022 05:02   DG Chest Port 1 View  Result Date: 01/16/2022 CLINICAL DATA:  Weakness, shortness of breath EXAM: PORTABLE CHEST 1 VIEW COMPARISON:  CTA chest dated 12/18/2021 FINDINGS: Multifocal patchy opacities, progressive when compared to the prior. The patient has known right upper lobe tumor and multifocal scarring. Given progression, superimposed pneumonia is not excluded, although some degree of progressive tumor could account for the progression in the right upper lobe. No pleural effusion or pneumothorax. Cardiomegaly. Left subclavian pacemaker. Thoracic aortic atherosclerosis. Bilateral shoulder arthroplasties. IMPRESSION: Multifocal patchy opacities, progressive when compared to prior. While some of this may reflect tumor in the right upper lobe, superimposed multifocal pneumonia is suspected. Electronically Signed   By: Julian Hy M.D.   On: 01/16/2022 23:40   EEG adult  Result Date: 01/18/2022 Lora Havens, MD     01/18/2022 12:55 PM  Patient Name: Kristin [name] MRN: [id number] Epilepsy Attending: Lora Havens Referring Physician/Provider: Rust-Chester, Huel Cote, NP Date: 01/18/2022 Duration: 23.24 mins Patient history: 88 year old female with seizure-like activity described as shaking in both upper extremities.  EEG to evaluate for seizure. Level of alertness:  lethargic AEDs during EEG study: Keppra, Versed Technical aspects: This EEG study was done with scalp electrodes positioned according to the 10-20 International system of electrode placement. Electrical activity was acquired at a sampling rate of '500Hz'  and reviewed with a high frequency filter of '70Hz'  and a low frequency filter of '1Hz' . EEG data were recorded continuously and digitally stored. Description: EEG showed continuous generalized 3-6 Esti And delta slowing, at times with triphasic morphology.  Hyperventilation and photic stimulation were not performed.   ABNORMALITY - Continuous slow, generalized IMPRESSION: This study is suggestive of moderate to severe diffuse encephalopathy, nonspecific etiology. No seizures or definite epileptiform discharges were seen throughout the recording. Lora Havens   ECHOCARDIOGRAM COMPLETE  Result Date: 01/17/2022    ECHOCARDIOGRAM REPORT   Patient Name:   CAITLEN WORTH Date of Exam: 01/17/2022 Medical Rec #:  [id number]     Height:       67.0 in Accession #:    [id number]    Weight:       167.1 lb Date of Birth:  10-Aug-1930      BSA:          1.874 m Patient Age:    3 years      BP:           108/84 mmHg Patient Gender: F             HR:           75 bpm. Exam Location:  [facility] Procedure: 2D Echo Indications:     Cardiomyopathy- Unspecified I42.9  History:         Patient has prior history of Echocardiogram examinations, most                  recent 05/24/2020.  Sonographer:     Kathlen Brunswick RDCS Referring Phys:  [id number] [place] L RUST-CHESTER Diagnosing Phys: Donnelly Angelica  Sonographer Comments:  Echo performed with patient supine and on  artificial respirator. Image acquisition challenging due to respiratory motion. IMPRESSIONS  1. Left ventricular ejection fraction, by estimation, is 50 to 55%. The left ventricle has low normal function. The left ventricle has no regional wall motion abnormalities. There is mild left ventricular hypertrophy. Left ventricular diastolic parameters are consistent with Grade I diastolic dysfunction (impaired relaxation). There is the interventricular septum is flattened in diastole ('D' shaped left ventricle), consistent with right ventricular volume overload.  2. Right ventricular systolic function is moderately reduced. The right ventricular size is severely enlarged.  3. Right atrial size was moderately dilated.  4. The mitral valve is normal in structure. Mild mitral valve regurgitation.  5. Tricuspid valve regurgitation is moderate.  6. The aortic valve is grossly normal. Aortic valve regurgitation is trivial. Aortic valve sclerosis is present, with no evidence of aortic valve stenosis.  7. Aortic dilatation noted. There is mild dilatation of the aortic root, measuring 39 mm. Conclusion(s)/Recommendation(s): SEVERE DILATED, HYPOCONTRACTILE RV. FINDINGS  Left Ventricle: Left ventricular ejection fraction, by estimation, is 50 to 55%. The left ventricle has low normal function. The left ventricle has no regional wall motion abnormalities. The left ventricular internal cavity size was normal in size. There is mild left ventricular hypertrophy. Abnormal (paradoxical) septal motion, consistent with left bundle branch block and the interventricular septum is flattened in diastole ('D' shaped left ventricle), consistent with right ventricular volume overload. Left ventricular diastolic parameters are consistent with Grade I diastolic dysfunction (impaired relaxation). Right Ventricle: The right ventricular size is severely enlarged. Right vetricular wall thickness was not well visualized. Right ventricular systolic  function is moderately reduced. Left Atrium: Left atrial size was normal in size. Right Atrium: Right atrial size was moderately dilated. Pericardium: Trivial pericardial effusion is present. Mitral Valve: The mitral valve is normal in structure. Mild mitral valve regurgitation. Tricuspid Valve: The tricuspid valve is normal in structure. Tricuspid valve regurgitation is moderate. Aortic Valve: The aortic valve is grossly normal. Aortic valve regurgitation is trivial. Aortic valve sclerosis is present, with no evidence of aortic valve stenosis. Aortic valve peak gradient measures 13.2 mmHg. Pulmonic Valve: The pulmonic valve was not well visualized. Pulmonic valve regurgitation is mild to moderate. Aorta: Aortic dilatation noted. There is mild dilatation of the aortic root, measuring 39 mm. Venous: IVC assessment for right atrial pressure unable to be performed due to mechanical ventilation. IAS/Shunts: No atrial level shunt detected by color flow Doppler.  LEFT VENTRICLE PLAX 2D LVIDd:         3.75 cm     Diastology LVIDs:         2.70 cm     LV e' medial:    3.81 cm/s LV PW:         1.40 cm     LV E/e' medial:  14.4 LV IVS:        1.12 cm     LV e' lateral:   7.94 cm/s LVOT diam:     2.00 cm     LV E/e' lateral: 6.9 LV SV:         47 LV SV Index:   25 LVOT Area:     3.14 cm  LV Volumes (MOD) LV vol d, MOD A4C: 52.9 ml LV vol s, MOD A4C: 23.3 ml LV SV MOD A4C:     52.9 ml RIGHT VENTRICLE RV Basal diam:  5.98 cm RV S prime:     11.90 cm/s TAPSE (M-mode): 2.2  cm LEFT ATRIUM             Index        RIGHT ATRIUM           Index LA diam:        4.20 cm 2.24 cm/m   RA Area:     31.00 cm LA Vol (A2C):   37.4 ml 19.96 ml/m  RA Volume:   128.00 ml 68.32 ml/m LA Vol (A4C):   43.7 ml 23.32 ml/m LA Biplane Vol: 44.6 ml 23.80 ml/m  AORTIC VALVE                 PULMONIC VALVE AV Area (Vmax): 1.68 cm     PV Vmax:          1.25 m/s AV Vmax:        182.00 cm/s  PV Peak grad:     6.2 mmHg AV Peak Grad:   13.2 mmHg    PR End  Diast Vel: 8.64 msec LVOT Vmax:      97.50 cm/s LVOT Vmean:     57.500 cm/s LVOT VTI:       0.150 m  AORTA Ao Root diam: 3.90 cm Ao Asc diam:  2.70 cm MITRAL VALVE               TRICUSPID VALVE MV Area (PHT): 2.66 cm    TV Peak grad:   27.1 mmHg MV Decel Time: 285 msec    TV Vmax:        2.60 m/s MV E velocity: 54.80 cm/s MV A velocity: 96.40 cm/s  SHUNTS MV E/A ratio:  0.57        Systemic VTI:  0.15 m                            Systemic Diam: 2.00 cm Donnelly Angelica Electronically signed by Donnelly Angelica Signature Date/Time: 01/17/2022/5:20:42 PM    Final    [name] EKG SITE RITE  Result Date: 01/17/2022 If Site Rite image not attached, placement could not be confirmed due to current cardiac rhythm.   #88 year old female patient with multiple medical problems including chronic respiratory failure on home O2/COPD CHF; recent diagnosis small cell lung cancer is currently admitted to hospital for mental status changes/acute respiratory failure.  #Small cell lung cancer-limited stage however status post palliative radiation.  Patient not a candidate for chemotherapy.  #Acute on chronic respiratory failure-multifocal pneumonia-underlying COPD/CHF.  S/p intubation.  Currently on broad-spectrum antibiotics  #Mental status changes-acute metabolic encephalopathy versus brain metastasis.  S/p evaluation with neurology.  #Acute on chronic renal failure-likely secondary to above event.  #Paroxysmal A-fib s/p PPM on Eliquis  PLAN/Recommendations:   #Continue care as per pulmonary/critical care with regards to acute respiratory failure.  #Recommend CT scan with and without contrast to rule out brain metastasis.  #With regards to small cell lung cancer-s/p radiation on a palliative basis.  Unfortunately this cannot be cured.  Overall prognosis is poor especially context multiple other problems/frailty  Thank you Dr. Lanney Gins for allowing me to participate in the care of your pleasant patient. Please do not hesitate  to contact me with questions or concerns in the interim.  Discussed with Dr. Lanney Gins.  Also discussed with patient's family Ms.Cherlyn Cushing.   All questions were answered. The patient knows to call the clinic with any problems, questions or concerns.    Cammie Sickle, MD 01/19/2022

## 2022-01-19 NOTE — Consult Note (Signed)
[facility] CM Inpatient Consult  01/19/2022  JAMICIA HAALAND 1930/03/15 [id number]  [facility] CM)   Patient chart has been reviewed with noted high risk score for unplanned readmissions and unplanned 30 day readmission.  Patient is active with [facility] CM outpatient chronic disease management services.  Plan: Will continue to follow for progression and disposition and update community care Freight forwarder.   Of note, [facility] Care Management services does not replace or interfere with any services that are arranged by inpatient case management or social work.    Netta Cedars, MSN, RN [facility] Liaison Toll free office [phone]

## 2022-01-19 NOTE — Progress Notes (Signed)
0710: Patient in bed resting, continuous gtts verified and infusing (per [facility]). Family asleep bedside, patient in no obvious distress, vital signs stable.   0900: Weaning continuous gtts (per [facility]). Patient in no obvious distress.   1035: AM rounds with patient care team, medication adjustments made by MD.   1100: Patient slowly becoming more agitated while sedation off, attempting to communicate with patient and see if patient follows commands/interacts appropriately. RT notified of excess secretions and restlessness.   1155: Patient repositioned, increasingly agitated with large amount of inline & oral secretions.   1205: ICU MD and oncologist bedside as patient is agitated but not following commands.Update provided to family by MD.    1300: Patient still very agitated, continuously coughing and gagging on ETT, not opening eyes or following commands. MD made aware that PRN fentanyl order discontinued & patient showing obvious signs of agitation and discomfort. Sedation requested, especially since patient to go off unit for CTB. Orders placed by MD.    1440: Patient agitated, asynchronous with vent (RR 30-40, continuously coughing and gagging). Patient orally and inline suctioned with large amounts of thick, white/clear, frothy sputum. RT called to bedside as vent continuously alarming and patient audibly gagging on tube after PRN medication.   1450: Secure chat sent to MD re patient agitation/vent dyssynchrony, and patient showing obvious discomfort (grimacing, biting, extremely restless) after PRN medication. Awaiting response.    1600: Verbal order for fentanyl gtt 0-444mcg/hr placed by this Probation officer. Warning from [facility], call placed to pharmacy to confirm order is correct. Per pharm okay to order as given from MD, but should reassess if patient requires more than 150-246mcg/hr. Orders placed. Unable to take patient for CTB as patient remains agitated.   1800: Patient remains agitated and restless  in bed, unable to transport for CTB at this time. Fentanyl bolus and gtt titrated.  1825: Patient repositioned and oral care provided q2 throughout this shift. With repositioning and boosting up in bed, patient finally appears comfortable in bed.

## 2022-01-19 NOTE — Progress Notes (Signed)
PHARMACY CONSULT NOTE  Pharmacy Consult for Electrolyte Monitoring and Replacement   Recent Labs: Potassium (mmol/L)  Date Value  01/19/2022 3.4 (L)  09/06/2014 4.4   Magnesium (mg/dL)  Date Value  01/19/2022 2.0  01/16/2014 2.0   Calcium (mg/dL)  Date Value  01/19/2022 8.5 (L)   Calcium, Total (mg/dL)  Date Value  09/06/2014 9.0   Albumin (g/dL)  Date Value  01/16/2022 3.3 (L)  07/10/2014 3.2 (L)   Phosphorus (mg/dL)  Date Value  01/19/2022 2.4 (L)   Sodium (mmol/L)  Date Value  01/19/2022 138  09/06/2014 138     Assessment: 71 yof w/ PMH of CKD 3A, lung cancer s/p radiation, paroxysmal A-fib on anticoagulation, chronic diastolic CHF, HTN, restless leg syndrome, 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    30 ml free water flush every 4 hours to maintain tube patency  Goal of Therapy:  Electrolytes wnl  Plan:  10 meQ KCl IV x 1 Recheck electrolytes in am  Dallie Piles ,PharmD Clinical Pharmacist 01/19/2022 11:31 AM

## 2022-01-20 ENCOUNTER — Inpatient Hospital Stay: Payer: Medicare HMO

## 2022-01-20 ENCOUNTER — Other Ambulatory Visit: Payer: Self-pay

## 2022-01-20 DIAGNOSIS — J449 Chronic obstructive pulmonary disease, unspecified: Secondary | ICD-10-CM

## 2022-01-20 DIAGNOSIS — I509 Heart failure, unspecified: Secondary | ICD-10-CM

## 2022-01-20 DIAGNOSIS — C3411 Malignant neoplasm of upper lobe, right bronchus or lung: Secondary | ICD-10-CM | POA: Diagnosis not present

## 2022-01-20 DIAGNOSIS — E785 Hyperlipidemia, unspecified: Secondary | ICD-10-CM

## 2022-01-20 DIAGNOSIS — Z87891 Personal history of nicotine dependence: Secondary | ICD-10-CM | POA: Diagnosis not present

## 2022-01-20 DIAGNOSIS — I110 Hypertensive heart disease with heart failure: Secondary | ICD-10-CM

## 2022-01-20 LAB — GLUCOSE, CAPILLARY
Glucose-Capillary: 102 mg/dL — ABNORMAL HIGH (ref 70–99)
Glucose-Capillary: 114 mg/dL — ABNORMAL HIGH (ref 70–99)
Glucose-Capillary: 121 mg/dL — ABNORMAL HIGH (ref 70–99)
Glucose-Capillary: 123 mg/dL — ABNORMAL HIGH (ref 70–99)
Glucose-Capillary: 124 mg/dL — ABNORMAL HIGH (ref 70–99)
Glucose-Capillary: 99 mg/dL (ref 70–99)

## 2022-01-20 LAB — CULTURE, RESPIRATORY W GRAM STAIN: Culture: NORMAL

## 2022-01-20 LAB — BASIC METABOLIC PANEL
Anion gap: 7 (ref 5–15)
BUN: 17 mg/dL (ref 8–23)
CO2: 24 mmol/L (ref 22–32)
Calcium: 8.5 mg/dL — ABNORMAL LOW (ref 8.9–10.3)
Chloride: 108 mmol/L (ref 98–111)
Creatinine, Ser: 1.06 mg/dL — ABNORMAL HIGH (ref 0.44–1.00)
GFR, Estimated: 50 mL/min — ABNORMAL LOW (ref >60)
Glucose, Bld: 128 mg/dL — ABNORMAL HIGH (ref 70–99)
Potassium: 3.2 mmol/L — ABNORMAL LOW (ref 3.5–5.1)
Sodium: 139 mmol/L (ref 135–145)

## 2022-01-20 LAB — CBC
HCT: 32.1 % — ABNORMAL LOW (ref 36.0–46.0)
Hemoglobin: 9.7 g/dL — ABNORMAL LOW (ref 12.0–15.0)
MCH: 26.7 pg (ref 26.0–34.0)
MCHC: 30.2 g/dL (ref 30.0–36.0)
MCV: 88.4 fL (ref 80.0–100.0)
Platelets: 154 10*3/uL (ref 150–400)
RBC: 3.63 MIL/uL — ABNORMAL LOW (ref 3.87–5.11)
RDW: 18.5 % — ABNORMAL HIGH (ref 11.5–15.5)
WBC: 7.1 10*3/uL (ref 4.0–10.5)
nRBC: 0 % (ref 0.0–0.2)

## 2022-01-20 LAB — CBC WITH DIFFERENTIAL/PLATELET
Abs Immature Granulocytes: 0.03 10*3/uL (ref 0.00–0.07)
Basophils Absolute: 0 10*3/uL (ref 0.0–0.1)
Basophils Relative: 0 %
Eosinophils Absolute: 0.7 10*3/uL — ABNORMAL HIGH (ref 0.0–0.5)
Eosinophils Relative: 10 %
HCT: 31.6 % — ABNORMAL LOW (ref 36.0–46.0)
Hemoglobin: 9.6 g/dL — ABNORMAL LOW (ref 12.0–15.0)
Immature Granulocytes: 0 %
Lymphocytes Relative: 8 %
Lymphs Abs: 0.5 10*3/uL — ABNORMAL LOW (ref 0.7–4.0)
MCH: 26.9 pg (ref 26.0–34.0)
MCHC: 30.4 g/dL (ref 30.0–36.0)
MCV: 88.5 fL (ref 80.0–100.0)
Monocytes Absolute: 0.4 10*3/uL (ref 0.1–1.0)
Monocytes Relative: 6 %
Neutro Abs: 5.5 10*3/uL (ref 1.7–7.7)
Neutrophils Relative %: 76 %
Platelets: 158 10*3/uL (ref 150–400)
RBC: 3.57 MIL/uL — ABNORMAL LOW (ref 3.87–5.11)
RDW: 18.2 % — ABNORMAL HIGH (ref 11.5–15.5)
WBC: 7.2 10*3/uL (ref 4.0–10.5)
nRBC: 0 % (ref 0.0–0.2)

## 2022-01-20 LAB — PHOSPHORUS: Phosphorus: 2.8 mg/dL (ref 2.5–4.6)

## 2022-01-20 LAB — URINE CULTURE: Culture: 1000 — AB

## 2022-01-20 LAB — PROCALCITONIN: Procalcitonin: 1.35 ng/mL

## 2022-01-20 LAB — MAGNESIUM: Magnesium: 1.9 mg/dL (ref 1.7–2.4)

## 2022-01-20 LAB — HEPARIN LEVEL (UNFRACTIONATED): Heparin Unfractionated: 0.49 IU/mL (ref 0.30–0.70)

## 2022-01-20 LAB — TROPONIN I (HIGH SENSITIVITY): Troponin I (High Sensitivity): 39 ng/L — ABNORMAL HIGH (ref <18)

## 2022-01-20 MED ORDER — LEVALBUTEROL HCL 0.63 MG/3ML IN NEBU
0.6300 mg | INHALATION_SOLUTION | Freq: Three times a day (TID) | RESPIRATORY_TRACT | Status: DC
  Administered 2022-01-20 – 2022-01-24 (×12): 0.63 mg via RESPIRATORY_TRACT
  Filled 2022-01-20 (×13): qty 3

## 2022-01-20 MED ORDER — NOREPINEPHRINE 16 MG/250ML-% IV SOLN
0.0000 ug/min | INTRAVENOUS | Status: DC
  Administered 2022-01-20: 2 ug/min via INTRAVENOUS
  Administered 2022-01-21: 8 ug/min via INTRAVENOUS
  Filled 2022-01-20: qty 250

## 2022-01-20 MED ORDER — POTASSIUM CHLORIDE 10 MEQ/100ML IV SOLN
10.0000 meq | INTRAVENOUS | Status: AC
  Administered 2022-01-20 (×3): 10 meq via INTRAVENOUS
  Filled 2022-01-20 (×3): qty 100

## 2022-01-20 MED ORDER — IOHEXOL 300 MG/ML  SOLN
100.0000 mL | Freq: Once | INTRAMUSCULAR | Status: AC | PRN
  Administered 2022-01-20: 100 mL via INTRAVENOUS

## 2022-01-20 MED ORDER — MAGNESIUM SULFATE IN D5W 1-5 GM/100ML-% IV SOLN
1.0000 g | Freq: Once | INTRAVENOUS | Status: AC
  Administered 2022-01-20: 1 g via INTRAVENOUS
  Filled 2022-01-20: qty 100

## 2022-01-20 MED ORDER — LEVOFLOXACIN IN D5W 750 MG/150ML IV SOLN
750.0000 mg | INTRAVENOUS | Status: AC
  Administered 2022-01-20 – 2022-01-22 (×2): 750 mg via INTRAVENOUS
  Filled 2022-01-20 (×2): qty 150

## 2022-01-20 MED ORDER — DEXMEDETOMIDINE HCL IN NACL 400 MCG/100ML IV SOLN
0.4000 ug/kg/h | INTRAVENOUS | Status: DC
  Administered 2022-01-20: 0.4 ug/kg/h via INTRAVENOUS
  Filled 2022-01-20: qty 100

## 2022-01-20 NOTE — Progress Notes (Signed)
6578: Bedside report from PM RN. Patient in no obvious distress, continuous gtts infusing (per [facility]).   0725: Discussed plan of care with patient son. Questions and concerns addressed.   0825: Sedation held (per [facility]). Patient remains calm and appears comfortable at this time. Eyes open, however patient does not interact appropriately.   0840: MD & RT rounding on patient, sedation off for WUA. Patient opening eyes, but not following commands at this time. Call placed to CT for availability around 930. Transport to be scheduled by CT. Discussed with MD will leave sedation off and if absolutely necessary use PRN versed order if patient unable to remain still.   0928: Patient becoming increasingly agitated while preparing to transport. Transport, RN, & RT accompanied the patient to CT & back without issues.   1030: Patient becomes increasingly agitated with or without stimulation while off sedation. Patient eyes opening, however patient is not following commands.   1115: Call placed to RT for SBT. Patient alert and agitated, not following commands.   1150: Patient remains extremely agitated, hands reaching toward ETT, legs kicking out of bed, biting and gagging on ETT. Patient not following commands, unable to get accurate pulse ox waveform and intermittently not obtaining BP due to agitation/movement.   1332: Secure chat send to MD re patient agitation, tachycardia, gagging and biting ET tube while avoiding all sedating medications.   1450: MD bedside, patient still not following commands. MD discussed patient condition and the different possibilities when/if patient is extubated with questionable neuro status. [name] (patient niece) bedside, requesting time to contact family and discuss plan/goals of care.   1540: Patient family would like to proceed with extubation and if need be would choose to reintubate. RT& MD  made aware  1625: Multiple family members bedside, MD and RT discussed patient  status and staying off of/limiting sedation as much as possible to improve neuro status. Family verbalizes understanding, planning not to extubate this shift.   1820: Oral care provided and patient repositioned q2 hr this shift. Patient with multiple periods of extreme agitation, however looks more comfortable at this time. BP soft, norepi restarted & titrated (per MAR).

## 2022-01-20 NOTE — Progress Notes (Signed)
Patient was transported to CT and back to ICU 12 without complication.

## 2022-01-20 NOTE — Progress Notes (Signed)
NAME:  Kristin [name], MRN:  [id number], DOB:  09-May-1930, LOS: 3 ADMISSION DATE:  01/16/2022, CONSULTATION DATE: 01/17/2022 REFERRING MD: Dr. Damita Dunnings, CHIEF COMPLAINT: Weakness  History of Present Illness:  88 year old female presenting to [facility] ED from home via EMS on 01/16/2022 with complaints of lethargy and weakness.  Per the patient's granddaughter bedside, [name], patient was in her normal state of health until earlier in the evening on 01/16/2022 when she picked up the phone but did not respond.  Patient's granddaughter lives next door and walked over to check on her finding her sitting at the kitchen table with her head on the table.  She was responsive and able to talk but very weak.  Only other thing family noticed was that the patient was sleeping more than usual.  No other complaints that family was aware of.  01/18/22- patient was unable to get NGT advaced by RN staff after multiple attempts. We will try again with direct visual utilzing GLideScope.  Patient is making attempts at opening eyes and having spontaneous non jerky movements. Family in room we met and reivewed medical plan.    01/19/22- patient on SBT today, mentation still lethargic.  Weaning centrally acting meds for mental status. Renal function markedly improved. Reviewed plan with oncologist plan for repeat neuroimaging with CTA head  01/20/22- patient had prolonged SBT today. She remains with aggitation and but able to open eyes and was taking better breaths while on weaning trial to extubate.  After as short while she developed wheezing bilaterally and appeard to have labored breathing.  Family was at bedside and we agreed to let her rest tonight and try again tommorow for extubation.   Pertinent  Medical History  Atrial fibrillation on Eliquis Right breast cancer 2004 HFpEF COPD COVID-19 06/11/2020 Gout Hepatitis C Hyperlipidemia Hypertension NSTEMI 04/08/2021 SCC lung status post palliative radiation SBRT x5  fractions PPM in place Significant Hospital Events: Including procedures, antibiotic start and stop dates in addition to other pertinent events   01/17/2022: Admit to ICU with acute hypoxic respiratory failure secondary to suspected aspiration in the setting of acute encephalopathy    Objective   Blood pressure (!) 89/54, pulse 64, temperature (!) 97.1 F (36.2 C), temperature source Axillary, resp. rate 20, height 5' 7.01" (1.702 m), weight 78.9 kg, SpO2 100 %.    Vent Mode: PRVC FiO2 (%):  [28 %-35 %] 28 % Set Rate:  [20 bmp] 20 bmp Vt Set:  [470 mL] 470 mL PEEP:  [5 cmH20] 5 cmH20 Pressure Support:  [8 cmH20-10 cmH20] 8 cmH20   Intake/Output Summary (Last 24 hours) at 01/20/2022 0746 Last data filed at 01/20/2022 0600 Gross per 24 hour  Intake 1942.1 ml  Output 1450 ml  Net 492.1 ml    Filed Weights   01/17/22 0455 01/19/22 0500 01/20/22 0500  Weight: 75.8 kg 78.8 kg 78.9 kg    Examination: General: Adult female, critically ill, lying in bed, lethargic, NAD HEENT: MM pink/moist, anicteric, atraumatic, neck supple Neuro: poorly responsive, RASS -1, unable to follow commands, PERRL +2, no movement to painful stimuli in any extremity CV: s1s2 RRR, NSR paced rhythm on monitor, no r/m/g Pulm: Regular, non labored on NRB 15 L, breath sounds coarse crackles-BUL & diminished-BLL GI: soft, rounded, non tender, bs x 4 GU: foley in place with clear yellow urine Skin: Limited exam- no rashes/lesions noted Extremities: warm/dry, pulses + 2 R/P, +2 edema noted BLE  Resolved Hospital Problem list     Assessment &  Plan:  Acute Hypoxic / Hypercapnic Respiratory Failure secondary to suspected aspiration in the setting of possible CAP & acute HFpEF exacerbation  - Ventilator settings: PRVC  8 mL/kg, 60% FiO2, 5 PEEP, continue ventilator support & lung protective strategies - Wean PEEP & FiO2 as tolerated, maintain SpO2 > 90% - Head of bed elevated 30 degrees, VAP protocol in place -  Plateau pressures less than 30 cm H20  - Intermittent chest x-ray & ABG PRN - Daily WUA with SBT as tolerated  - Ensure adequate pulmonary hygiene  - F/u cultures, trend PCT -Broadened CAP/aspiration coverage: Zosyn & vancomycin - bronchodilators PRN - PAD protocol in place: continue Fentanyl IVP   Suspected acute on Chronic HFpEF exacerbation Elevated Troponin secondary to suspected demand ischemia versus NSTEMI Chronic / Paroxysmal Atrial Fibrillation Troponin: 62 BNP: Pending - Trend troponins  - Echocardiogram ordered - f/u BNP - Continuous cardiac monitoring  - Daily weights to assess volume status - Diurese with the use of IV lasix, as hemodynamics and renal function permit -Continue outpatient Eliquis  Acute Kidney Injury superimposed on CKD stage 3b  Baseline Cr: 1.18, Cr on admission:1.50 - Strict I/O's: alert provider if UOP < 0.5 mL/kg/hr - Daily BMP, replace electrolytes PRN - Avoid nephrotoxic agents as able, ensure adequate renal perfusion  Acute encephalopathy secondary to unknown etiology -Stat CT head without contrast - f/u ammonia level -Consider EEG/MRI brain if mentation does not improve with oxygenation support -Neurochecks every 4 -Avoid sedating medications as able - supportive care  Best Practice (right click and "Reselect all SmartList Selections" daily)  Diet/type: NPO w/ meds via tube DVT prophylaxis: DOAC GI prophylaxis: PPI Lines: N/A Foley:  Yes, and it is still needed Code Status:  full code Last date of multidisciplinary goals of care discussion [01/16/22] Updated granddaughter, HCPOA, via telephone.  Questions and concerns answered at this time.  Labs   CBC: Recent Labs  Lab 01/16/22 2342 01/17/22 0549 01/18/22 0357 01/19/22 0904 01/20/22 0106  WBC 4.4 6.7 8.7 7.3 7.2  7.1  NEUTROABS 3.0  --   --   --  5.5  HGB 12.0 10.0* 12.1 10.3* 9.6*  9.7*  HCT 40.5 35.2* 40.4 33.6* 31.6*  32.1*  MCV 90.4 92.4 91.2 88.4 88.5  88.4  PLT  180 204 194 173 158  154     Basic Metabolic Panel: Recent Labs  Lab 01/16/22 2342 01/17/22 0549 01/18/22 0357 01/19/22 0904 01/20/22 0521  NA 135 136 136 138 139  K 4.8 4.1 4.0 3.4* 3.2*  CL 104 106 104 108 108  CO2 _0 GLUCOSE 120* 81 137* 110* 128*  BUN _1 CREATININE 1.50* 1.34* 1.36* 1.02* 1.06*  CALCIUM 9.0 8.6* 8.8* 8.5* 8.5*  MG  --  2.1 2.1 2.0 1.9  PHOS  --  5.0* 2.9 2.4* 2.8    GFR: Estimated Creatinine Clearance: 38.1 mL/min (A) (by C-G formula based on SCr of 1.06 mg/dL (H)). Recent Labs  Lab 01/16/22 2343 01/17/22 0549 01/17/22 2256 01/18/22 0357 01/19/22 0904 01/20/22 0031 01/20/22 0106  PROCALCITON  --  0.21  --  1.07 1.19 1.35  --   WBC  --  6.7  --  8.7 7.3  --  7.2  7.1  LATICACIDVEN 1.5  --  0.9  --   --   --   --      Liver Function Tests: Recent Labs  Lab 01/16/22 2342  AST 41  ALT 17  ALKPHOS 81  BILITOT 1.2  PROT 8.3*  ALBUMIN 3.3*    No results for input(s): LIPASE, AMYLASE in the last 168 hours. Recent Labs  Lab 01/17/22 0549  AMMONIA 46*     ABG    Component Value Date/Time   PHART 7.28 (L) 01/17/2022 0800   PCO2ART 49 (H) 01/17/2022 0800   PO2ART 76 (L) 01/17/2022 0800   HCO3 23.0 01/17/2022 0800   ACIDBASEDEF 4.1 (H) 01/17/2022 0800   O2SAT 96.1 01/17/2022 0800      Coagulation Profile: Recent Labs  Lab 01/19/22 0030  INR 1.7*     Cardiac Enzymes: No results for input(s): CKTOTAL, CKMB, CKMBINDEX, TROPONINI in the last 168 hours.  HbA1C: Hgb A1c MFr Bld  Date/Time Value Ref Range Status  11/05/2021 02:03 PM 6.1 (H) 4.8 - 5.6 % Final    Comment:    (NOTE)         Prediabetes: 5.7 - 6.4         Diabetes: >6.4         Glycemic control for adults with diabetes: <7.0   04/01/2020 08:07 AM 5.5 <5.7 % of total Hgb Final    Comment:    For the purpose of screening for the presence of diabetes: . <5.7%       Consistent with the absence of diabetes 5.7-6.4%    Consistent  with increased risk for diabetes             (prediabetes) > or =6.5%  Consistent with diabetes . This assay result is consistent with a decreased risk of diabetes. . Currently, no consensus exists regarding use of hemoglobin A1c for diagnosis of diabetes in children. . According to American Diabetes Association (ADA) guidelines, hemoglobin A1c <7.0% represents optimal control in non-pregnant diabetic patients. Different metrics may apply to specific patient populations.  Standards of Medical Care in Diabetes(ADA). .     CBG: Recent Labs  Lab 01/19/22 1611 01/19/22 1937 01/19/22 2338 01/20/22 0340 01/20/22 0718  GLUCAP 111* 103* 99 123* 114*     Review of Systems:   UTA patient unresponsive unable to participate in interview  Past Medical History:  She,  has a past medical history of Acute postoperative pain (02/09/2018), Arrhythmia, sinus node (04/03/2014), Arthritis, Arthritis, Atrial fibrillation ([place]), Back pain, Bradycardia, Breast cancer ([place]) (2004), Cancer ([place]) (2004), CHF (congestive heart failure) ([place]), Chronic back pain, CKD (chronic kidney disease), COPD (chronic obstructive pulmonary disease) ([place]), COVID-19 (06/11/2020), Cystocele, Decubitus ulcers, Dehydration, Gout, Hematuria, Hepatitis C, Hyperlipidemia, Hypertension, Hypomagnesemia (06/25/2015), NSTEMI (non-ST elevated myocardial infarction) ([place]) (04/08/2021), OAB (overactive bladder), Overactive bladder, Overdose opiate, accidental or unintentional, sequela (10/02/2020), Restless leg, Shoulder pain, bilateral, Urinary frequency, UTI (lower urinary tract infection), and Vaginal atrophy.   Surgical History:   Past Surgical History:  Procedure Laterality Date   ABDOMINAL HYSTERECTOMY     BACK SURGERY     BREAST LUMPECTOMY Right 2004   BREAST SURGERY     rt lumpectomy   CARPAL TUNNEL RELEASE     CATARACT EXTRACTION W/PHACO  10/19/2011   Procedure: CATARACT EXTRACTION PHACO AND INTRAOCULAR LENS PLACEMENT  (IOC);  Surgeon: Elta Guadeloupe T. Gershon Crane, MD;  Location: [facility];  Service: Ophthalmology;  Laterality: Right;  CDE:10.81   CATARACT EXTRACTION W/PHACO  11/02/2011   Procedure: CATARACT EXTRACTION PHACO AND INTRAOCULAR LENS PLACEMENT (IOC);  Surgeon: Elta Guadeloupe T. Gershon Crane, MD;  Location: [facility];  Service: Ophthalmology;  Laterality: Left;  CDE 8.60   CHOLECYSTECTOMY  3/18   COLON SURGERY     INSERT / REPLACE / REMOVE PACEMAKER     JOINT REPLACEMENT     bilateral TKA   LOWER EXTREMITY ANGIOGRAPHY Left 10/14/2020   Procedure: LOWER EXTREMITY ANGIOGRAPHY;  Surgeon: Katha Cabal, MD;  Location: [place] [facility];  Service: Cardiovascular;  Laterality: Left;   LOWER EXTREMITY ANGIOGRAPHY Right 09/08/2021   Procedure: LOWER EXTREMITY ANGIOGRAPHY;  Surgeon: Katha Cabal, MD;  Location: [place] [facility];  Service: Cardiovascular;  Laterality: Right;   PACEMAKER INSERTION N/A 12/24/2015   Procedure: INSERTION PACEMAKER;  Surgeon: Isaias Cowman, MD;  Location: [facility] OR[facility];  Service: Cardiovascular;  Laterality: N/A;   ROTATOR CUFF REPAIR     bilateral   VIDEO BRONCHOSCOPY WITH ENDOBRONCHIAL NAVIGATION Right 11/11/2021   Procedure: VIDEO BRONCHOSCOPY WITH ENDOBRONCHIAL NAVIGATION;  Surgeon: Ottie Glazier, MD;  Location: [facility] OR[facility];  Service: Thoracic;  Laterality: Right;     Social History:   reports that she quit smoking about 23 years ago. Her smoking use included cigarettes. She has a 50.00 pack-year smoking history. She has never used smokeless tobacco. She reports current drug use. Drug: Oxycodone. She reports that she does not drink alcohol.   Family History:  Her family history includes Breast cancer in her paternal aunt; Breast cancer (age of onset: 75) in her sister; Cancer in her sister; Heart disease in her father and mother; Kidney disease in her brother; Stroke in her mother. There is no history of Anesthesia problems, Hypotension, Malignant hyperthermia, or Pseudochol deficiency.    Allergies Allergies  Allergen Reactions   Flexeril [Cyclobenzaprine Hcl]     Severe Rash   Vancomycin Rash    [place] Syndrome     Home Medications  Prior to Admission medications   Medication Sig Start Date End Date Taking? Authorizing Provider  acetaminophen (TYLENOL) 500 MG tablet Take 500 mg by mouth in the morning and at bedtime.   Yes [provider]  apixaban (ELIQUIS) 2.5 MG TABS tablet Take 1 tablet (2.5 mg total) by mouth 2 (two) times daily. 04/09/21  Yes Wieting, [name], MD  atorvastatin (LIPITOR) 20 MG tablet TAKE 1 TABLET BY MOUTH ONCE DAILY FOR CHOLESTEROL Patient taking differently: Take 20 mg by mouth daily. 02/17/21  Yes Karamalegos, Alexander J, DO  BREZTRI AEROSPHERE 160-9-4.8 MCG/ACT AERO Inhale 2 puffs into the lungs 2 (two) times daily. 07/07/21  Yes Karamalegos, Devonne Doughty, DO  Cholecalciferol (VITAMIN D) 2000 units tablet Take 2,000 Units by mouth daily.    Yes [provider]  empagliflozin (JARDIANCE) 10 MG TABS tablet Take 1 tablet (10 mg total) by mouth daily before breakfast. 12/29/21  Yes [name] A, FNP  furosemide (LASIX) 40 MG tablet Take 1 tablet (40 mg total) by mouth daily. 09/23/21  Yes Nicole Kindred A, DO  gabapentin (NEURONTIN) 300 MG capsule Take 1 capsule (300 mg total) by mouth 3 (three) times daily. 11/12/21  Yes Annita Brod, MD  GNP ASPIRIN LOW DOSE 81 MG EC tablet TAKE 1 TABLET BY MOUTH ONCE DAILY. *SWALLOW WHOLE* 08/18/21  Yes Schnier, Dolores Lory, MD  levocetirizine (XYZAL) 5 MG tablet TAKE (1/2) TABLET BY MOUTH EVERY OTHER DAY. 06/19/21  Yes Karamalegos, Devonne Doughty, DO  Melatonin 10 MG TABS Take 10 mg by mouth at bedtime.   Yes [provider]  Multiple Vitamin (MULTIVITAMIN WITH MINERALS) TABS tablet Take 1 tablet by mouth daily. 11/13/21  Yes Annita Brod, MD  oxyCODONE (OXY IR/ROXICODONE) 5 MG  immediate release tablet Take 2 tablets (10 mg total) by mouth 2 (two) times daily. May also take 1  tablet (5 mg total) at bedtime as needed for severe pain. Must last 30 days.. 12/23/21 01/22/22 Yes Milinda Pointer, MD  rOPINIRole (REQUIP) 2 MG tablet TAKE 2 TABLETS BY MOUTH AT BEDTIME 10/21/21  Yes Karamalegos, Alexander J, DO  albuterol (PROVENTIL) (2.5 MG/3ML) 0.083% nebulizer solution USE 1 VIAL IN NEBULIZER EVERY 8 HOURS AS NEEDED FOR WHEEZING OR SHORTNESS OF BREATH. 12/22/21   Karamalegos, Devonne Doughty, DO  bisacodyl (DULCOLAX) 5 MG EC tablet Take 2 tablets (10 mg total) by mouth daily as needed for moderate constipation. 07/04/20   Karamalegos, Devonne Doughty, DO  diclofenac Sodium (VOLTAREN) 1 % GEL Apply 4 g topically 2 (two) times daily as needed (As needed for pain). 10/15/21   Kris Hartmann, NP  feeding supplement (ENSURE ENLIVE / ENSURE PLUS) LIQD Take 237 mLs by mouth 2 (two) times daily between meals. 11/13/21   Annita Brod, MD  oxyCODONE (OXY IR/ROXICODONE) 5 MG immediate release tablet Take 2 tablets (10 mg total) by mouth 2 (two) times daily. May also take 1 tablet (5 mg total) at bedtime as needed for severe pain. Must last 30 days.. 01/22/22 02/21/22  Milinda Pointer, MD  oxyCODONE (OXY IR/ROXICODONE) 5 MG immediate release tablet Take 2 tablets (10 mg total) by mouth 2 (two) times daily. May also take 1 tablet (5 mg total) at bedtime as needed for severe pain. Must last 30 days.. 02/21/22 03/23/22  Milinda Pointer, MD  OXYGEN Inhale 2 L into the lungs daily.    [provider]    Critical care provider statement:   Total critical care time: 33 minutes   Performed by: Lanney Gins MD   Critical care time was exclusive of separately billable procedures and treating other patients.   Critical care was necessary to treat or prevent imminent or life-threatening deterioration.   Critical care was time spent personally by me on the following activities: development of treatment plan with patient and/or surrogate as well as nursing, discussions with consultants, evaluation of  patient's response to treatment, examination of patient, obtaining history from patient or surrogate, ordering and performing treatments and interventions, ordering and review of laboratory studies, ordering and review of radiographic studies, pulse oximetry and re-evaluation of patient's condition.    Ottie Glazier, M.D.  Pulmonary & Critical Care Medicine

## 2022-01-20 NOTE — Progress Notes (Signed)
ANTICOAGULATION CONSULT NOTE  Pharmacy Consult for heparin infusion Indication: atrial fibrillation  Allergies  Allergen Reactions   Flexeril [Cyclobenzaprine Hcl]     Severe Rash   Vancomycin Rash    [place] Syndrome    Patient Measurements: Height: 5' 7.01" (170.2 cm) Weight: 78.9 kg (173 lb 15.1 oz) IBW/kg (Calculated) : 61.62 Heparin Dosing Weight: 75.8 kg  Vital Signs: Temp: 98.9 F (37.2 C) (05/31 0345) Temp Source: Axillary (05/31 0345) BP: 101/65 (05/31 0300) Pulse Rate: 60 (05/31 0300)  Labs: Recent Labs    01/18/22 0357 01/18/22 1754 01/19/22 0030 01/19/22 0904 01/19/22 1943 01/19/22 2241 01/20/22 0031 01/20/22 0106 01/20/22 0521  HGB 12.1  --   --  10.3*  --   --   --  9.6*  9.7*  --   HCT 40.4  --   --  33.6*  --   --   --  31.6*  32.1*  --   PLT 194  --   --  173  --   --   --  158  154  --   APTT  --   --  58*  --  61*  --   --   --   --   LABPROT  --   --  20.0*  --   --   --   --   --   --   INR  --   --  1.7*  --   --   --   --   --   --   HEPARINUNFRC  --   --  0.81*  --  0.46  --   --   --  0.49  CREATININE 1.36*  --   --  1.02*  --   --   --   --  1.06*  TROPONINIHS  --  53*  --   --   --  33* 39*  --   --      Estimated Creatinine Clearance: 38.1 mL/min (A) (by C-G formula based on SCr of 1.06 mg/dL (H)).   Medical History: Past Medical History:  Diagnosis Date   Acute postoperative pain 02/09/2018   Arrhythmia, sinus node 04/03/2014   Arthritis    Arthritis    Atrial fibrillation (HCC)    Back pain    lower back chronic   Bradycardia    Breast cancer ([place]) 2004   right breast cancer   Cancer ([place]) 2004   rt breast cancer-post lumpectomy- chemo/rad   CHF (congestive heart failure) (HCC)    Chronic back pain    CKD (chronic kidney disease)    COPD (chronic obstructive pulmonary disease) ([place])    wears O2 at 2L via [place] at night   COVID-19 06/11/2020   Cystocele    Decubitus ulcers    Dehydration    Gout    Hematuria     Hepatitis C    Hyperlipidemia    Hypertension    Hypomagnesemia 06/25/2015   NSTEMI (non-ST elevated myocardial infarction) ([place]) 04/08/2021   OAB (overactive bladder)    Overactive bladder    Overdose opiate, accidental or unintentional, sequela 10/02/2020   Restless leg    Shoulder pain, bilateral    Urinary frequency    UTI (lower urinary tract infection)    Vaginal atrophy   Heparin Dosing Weight: 75.8 kg  Medications:  PTA Med: Apixaban 2.5 mg BID last dose 5/27 (AM) Pt given Lovenox 75 mg 5/29 @ 1500  Assessment: Pt  is 88 yo female being transitioned from therapeutic Lovenox to Heparin for A. Fib.  Date Time aPTT/HL Rate/Comment 5/30 0030 58s / 0.81 Labs prior to UFH gtt; (last dose of lovenox 75mg  5/29 1500) 5/30 1945 61s / 0.46 Therapeutic HL; correlation noted. 1100 un/hr     Baseline Labs: aPTT - unk INR - 1.7 Hgb - 12>10.3 Plts - 180>173  Goal of Therapy:  Heparin level 0.3-0.7 units/ml aPTT 66-102 seconds Monitor platelets by anticoagulation protocol: Yes   Plan:  Heparin level therapeutic 0.49. Continue heparin infusion at 1100 units/hr. HL daily w/ AM labs while therapeutic on heparin. CBC daily while on heparin.  Renda Rolls, PharmD, [facility] 01/20/2022 6:12 AM

## 2022-01-20 NOTE — Patient Outreach (Signed)
[facility]) Care Management  01/20/2022  BERNEICE ZETTLEMOYER Dec 21, 1929 [id number]   Case Closure   RN CM received notification from [facility] hospital liaison that patient currently inpatient. Upon chart review noted that patient active with [facility] embedded CCM services.     Plan: RN CM will close case.   Enzo Montgomery, RN,BSN,CCM [place] [facility] Telephonic Care Management Coordinator Direct Phone: [phone] Toll Free: [phone] Fax: [phone]

## 2022-01-20 NOTE — Progress Notes (Addendum)
PHARMACY CONSULT NOTE  Pharmacy Consult for Electrolyte Monitoring and Replacement   Recent Labs: Potassium (mmol/L)  Date Value  01/20/2022 3.2 (L)  09/06/2014 4.4   Magnesium (mg/dL)  Date Value  01/20/2022 1.9  01/16/2014 2.0   Calcium (mg/dL)  Date Value  01/20/2022 8.5 (L)   Calcium, Total (mg/dL)  Date Value  09/06/2014 9.0   Albumin (g/dL)  Date Value  01/16/2022 3.3 (L)  07/10/2014 3.2 (L)   Phosphorus (mg/dL)  Date Value  01/20/2022 2.8   Sodium (mmol/L)  Date Value  01/20/2022 139  09/06/2014 138     Assessment: 40 yof w/ PMH of CKD 3A, lung cancer s/p radiation, paroxysmal A-fib on anticoagulation, chronic diastolic CHF, HTN, restless leg syndrome, 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 Pharmacy is asked to follow and replace electrolytes  Goal of Therapy:  Potassium 4.0 - 5.1 mmol/L Magnesium 2.0 - 2.4 mg/dL All Other Electrolytes WNL  Plan:  10 meQ KCl IV x 3 1 gram IV magnesium sulfate x 1 Recheck electrolytes in am  Dallie Piles ,PharmD Clinical Pharmacist 01/20/2022 7:13 AM

## 2022-01-20 NOTE — Progress Notes (Signed)
MARIANNE GOLIGHTLY   DOB:November 02, 1929   IR#:[id number]    Subjective: pt still intubated. Agitated off sedation. Off pressors.   Objective:  Vitals:   01/20/22 1730 01/20/22 1800  BP: (!) 80/52 (!) 71/44  Pulse:  (!) 115  Resp: (!) 24 (!) 32  Temp:  (!) 100.9 F (38.3 C)  SpO2:  100%     Intake/Output Summary (Last 24 hours) at 01/20/2022 1814 Last data filed at 01/20/2022 1720 Gross per 24 hour  Intake 2379.55 ml  Output 805 ml  Net 1574.55 ml    Physical Exam Vitals and nursing note reviewed.  Constitutional:      Comments: Intubated/sedated  HENT:     Head: Normocephalic and atraumatic.  Cardiovascular:     Rate and Rhythm: Regular rhythm. Tachycardia present.  Pulmonary:     Comments: Decreased breath sounds bilaterally.  Coarse breath sounds Abdominal:     General: Abdomen is flat.     Palpations: Abdomen is soft.  Skin:    General: Skin is warm and dry.  Neurological:     Comments: Sedated     Labs:  Lab Results  Component Value Date   WBC 7.1 01/20/2022   WBC 7.2 01/20/2022   HGB 9.7 (L) 01/20/2022   HGB 9.6 (L) 01/20/2022   HCT 32.1 (L) 01/20/2022   HCT 31.6 (L) 01/20/2022   MCV 88.4 01/20/2022   MCV 88.5 01/20/2022   PLT 154 01/20/2022   PLT 158 01/20/2022   NEUTROABS 5.5 01/20/2022    Lab Results  Component Value Date   NA 139 01/20/2022   K 3.2 (L) 01/20/2022   CL 108 01/20/2022   CO2 24 01/20/2022    Studies:  CT HEAD W & WO CONTRAST (5MM)  Result Date: 01/20/2022 CLINICAL DATA:  Mental status change of unknown cause EXAM: CT HEAD WITHOUT AND WITH CONTRAST TECHNIQUE: Contiguous axial images were obtained from the base of the skull through the vertex without and with intravenous contrast. RADIATION DOSE REDUCTION: This exam was performed according to the departmental dose-optimization program which includes automated exposure control, adjustment of the mA and/or kV according to patient size and/or use of iterative reconstruction technique. CONTRAST:   169mL OMNIPAQUE IOHEXOL 300 MG/ML  SOLN COMPARISON:  01/18/2022 FINDINGS: Brain: No change. Generalized age related atrophy. No sign of acute infarction, mass lesion, hemorrhage, hydrocephalus or extra-axial collection. After contrast administration, no abnormal enhancement occurs. Vascular: There is atherosclerotic calcification of the major vessels at the base of the brain. Skull: Negative Sinuses/Orbits: Clear/normal Other: None IMPRESSION: No change. Age related volume loss. No sign of acute infarction. No abnormal enhancement. Electronically Signed   By: Nelson Chimes M.D.   On: 01/20/2022 09:57   DG Chest Port 1 View  Result Date: 01/19/2022 CLINICAL DATA:  Acute respiratory failure with hypoxia EXAM: PORTABLE CHEST 1 VIEW COMPARISON:  01/17/2022 chest radiograph. FINDINGS: Left rotated chest radiograph. Endotracheal tube tip is 2.3 cm above the carina. Right PICC terminates over the cavoatrial junction. Stable configuration of 2 lead left subclavian pacemaker. Partially visualized bilateral shoulder arthroplasty. Stable cardiomediastinal silhouette with mild cardiomegaly. No pneumothorax. No pleural effusion. Mild-to-moderate diffuse prominence of the parahilar interstitial markings, similar. IMPRESSION: 1. Well-positioned support structures. 2. Stable mild cardiomegaly and mild-to-moderate diffuse prominence of the parahilar interstitial markings, suggesting congestive heart failure. Electronically Signed   By: Ilona Sorrel M.D.   On: 01/19/2022 7:12    #88 year old female patient with multiple medical problems including chronic respiratory failure on home  O2/COPD CHF; recent diagnosis small cell lung cancer is currently admitted to hospital for mental status changes/acute respiratory failure.   # Small cell lung cancer-limited stage however status post palliative radiation. Patient not a candidate for chemotherapy- over all poor prognosis.    # Acute on chronic respiratory failure-multifocal  pneumonia-underlying COPD/CHF.  S/p intubation.  Currently on broad-spectrum antibiotics.    # Mental status changes-acute metabolic encephalopathy versus brain metastasis.  S/p evaluation with neurology. Awaiting on CT scan.    #Paroxysmal A-fib s/p PPM on IV heparin.   Discussed with patient's son by the bedside.   Addendum: CT with and without contrast- negative for brain metastases; or acute process.    Cammie Sickle, MD 01/20/2022  6:14 PM

## 2022-01-21 ENCOUNTER — Ambulatory Visit: Payer: Medicare HMO | Attending: Radiation Oncology | Admitting: Radiation Oncology

## 2022-01-21 LAB — BASIC METABOLIC PANEL
Anion gap: 3 — ABNORMAL LOW (ref 5–15)
BUN: 19 mg/dL (ref 8–23)
CO2: 25 mmol/L (ref 22–32)
Calcium: 9 mg/dL (ref 8.9–10.3)
Chloride: 109 mmol/L (ref 98–111)
Creatinine, Ser: 1.1 mg/dL — ABNORMAL HIGH (ref 0.44–1.00)
GFR, Estimated: 48 mL/min — ABNORMAL LOW (ref >60)
Glucose, Bld: 151 mg/dL — ABNORMAL HIGH (ref 70–99)
Potassium: 3.8 mmol/L (ref 3.5–5.1)
Sodium: 137 mmol/L (ref 135–145)

## 2022-01-21 LAB — GLUCOSE, CAPILLARY
Glucose-Capillary: 103 mg/dL — ABNORMAL HIGH (ref 70–99)
Glucose-Capillary: 104 mg/dL — ABNORMAL HIGH (ref 70–99)
Glucose-Capillary: 132 mg/dL — ABNORMAL HIGH (ref 70–99)
Glucose-Capillary: 146 mg/dL — ABNORMAL HIGH (ref 70–99)
Glucose-Capillary: 148 mg/dL — ABNORMAL HIGH (ref 70–99)
Glucose-Capillary: 95 mg/dL (ref 70–99)

## 2022-01-21 LAB — CBC
HCT: 36.1 % (ref 36.0–46.0)
Hemoglobin: 11 g/dL — ABNORMAL LOW (ref 12.0–15.0)
MCH: 26.8 pg (ref 26.0–34.0)
MCHC: 30.5 g/dL (ref 30.0–36.0)
MCV: 88 fL (ref 80.0–100.0)
Platelets: 162 10*3/uL (ref 150–400)
RBC: 4.1 MIL/uL (ref 3.87–5.11)
RDW: 18.2 % — ABNORMAL HIGH (ref 11.5–15.5)
WBC: 10.5 10*3/uL (ref 4.0–10.5)
nRBC: 0 % (ref 0.0–0.2)

## 2022-01-21 LAB — HEPARIN LEVEL (UNFRACTIONATED): Heparin Unfractionated: 0.49 IU/mL (ref 0.30–0.70)

## 2022-01-21 LAB — MAGNESIUM: Magnesium: 2 mg/dL (ref 1.7–2.4)

## 2022-01-21 LAB — PHOSPHORUS: Phosphorus: 2.8 mg/dL (ref 2.5–4.6)

## 2022-01-21 MED ORDER — POTASSIUM CHLORIDE 10 MEQ/100ML IV SOLN
10.0000 meq | Freq: Once | INTRAVENOUS | Status: AC
  Administered 2022-01-21: 10 meq via INTRAVENOUS
  Filled 2022-01-21: qty 100

## 2022-01-21 MED ORDER — ORAL CARE MOUTH RINSE
15.0000 mL | Freq: Two times a day (BID) | OROMUCOSAL | Status: DC
  Administered 2022-01-22 – 2022-01-31 (×15): 15 mL via OROMUCOSAL

## 2022-01-21 MED ORDER — LIDOCAINE 5 % EX PTCH
1.0000 | MEDICATED_PATCH | CUTANEOUS | Status: DC
  Administered 2022-01-21 – 2022-01-30 (×10): 1 via TRANSDERMAL
  Filled 2022-01-21 (×11): qty 1

## 2022-01-21 MED ORDER — CHLORHEXIDINE GLUCONATE 0.12 % MT SOLN
15.0000 mL | Freq: Two times a day (BID) | OROMUCOSAL | Status: DC
  Administered 2022-01-21 – 2022-01-31 (×16): 15 mL via OROMUCOSAL
  Filled 2022-01-21 (×15): qty 15

## 2022-01-21 NOTE — Progress Notes (Signed)
ANTICOAGULATION CONSULT NOTE  Pharmacy Consult for heparin infusion Indication: atrial fibrillation  Allergies  Allergen Reactions   Flexeril [Cyclobenzaprine Hcl]     Severe Rash   Vancomycin Rash    [place] Syndrome    Patient Measurements: Height: 5' 7.01" (170.2 cm) Weight: 79.4 kg (175 lb 0.7 oz) IBW/kg (Calculated) : 61.62 Heparin Dosing Weight: 75.8 kg  Vital Signs: Temp: 98.1 F (36.7 C) (06/01 0458) Temp Source: Axillary (06/01 0143) BP: 113/63 (06/01 0530) Pulse Rate: 53 (06/01 0530)  Labs: Recent Labs    01/18/22 1754 01/19/22 0030 01/19/22 0030 01/19/22 0904 01/19/22 1943 01/19/22 2241 01/20/22 0031 01/20/22 0106 01/20/22 0521 01/21/22 0448  HGB  --   --    < > 10.3*  --   --   --  9.6*  9.7*  --  11.0*  HCT  --   --   --  33.6*  --   --   --  31.6*  32.1*  --  36.1  PLT  --   --   --  173  --   --   --  158  154  --  162  APTT  --  58*  --   --  61*  --   --   --   --   --   LABPROT  --  20.0*  --   --   --   --   --   --   --   --   INR  --  1.7*  --   --   --   --   --   --   --   --   HEPARINUNFRC  --  0.81*   < >  --  0.46  --   --   --  0.49 0.49  CREATININE  --   --   --  1.02*  --   --   --   --  1.06* 1.10*  TROPONINIHS 53*  --   --   --   --  33* 39*  --   --   --    < > = values in this interval not displayed.     Estimated Creatinine Clearance: 36.9 mL/min (A) (by C-G formula based on SCr of 1.1 mg/dL (H)).   Medical History: Past Medical History:  Diagnosis Date   Acute postoperative pain 02/09/2018   Arrhythmia, sinus node 04/03/2014   Arthritis    Arthritis    Atrial fibrillation (HCC)    Back pain    lower back chronic   Bradycardia    Breast cancer ([place]) 2004   right breast cancer   Cancer ([place]) 2004   rt breast cancer-post lumpectomy- chemo/rad   CHF (congestive heart failure) (HCC)    Chronic back pain    CKD (chronic kidney disease)    COPD (chronic obstructive pulmonary disease) ([place])    wears O2 at 2L via [place]  at night   COVID-19 06/11/2020   Cystocele    Decubitus ulcers    Dehydration    Gout    Hematuria    Hepatitis C    Hyperlipidemia    Hypertension    Hypomagnesemia 06/25/2015   NSTEMI (non-ST elevated myocardial infarction) ([place]) 04/08/2021   OAB (overactive bladder)    Overactive bladder    Overdose opiate, accidental or unintentional, sequela 10/02/2020   Restless leg    Shoulder pain, bilateral    Urinary frequency    UTI (  lower urinary tract infection)    Vaginal atrophy   Heparin Dosing Weight: 75.8 kg  Medications:  PTA Med: Apixaban 2.5 mg BID last dose 5/27 (AM) Pt given Lovenox 75 mg 5/29 @ 1500  Assessment: Pt is 88 yo female being transitioned from therapeutic Lovenox to Heparin for A. Fib.  Date Time aPTT/HL Rate/Comment 5/30 0030 58s / 0.81 Labs prior to UFH gtt; (last dose of lovenox 75mg  5/29 1500) 5/30 1945 61s / 0.46 Therapeutic HL; correlation noted. 1100 un/hr     Baseline Labs: aPTT - unk INR - 1.7 Hgb - 12>10.3 Plts - 180>173  Goal of Therapy:  Heparin level 0.3-0.7 units/ml aPTT 66-102 seconds Monitor platelets by anticoagulation protocol: Yes   Plan:  Heparin level therapeutic 0.49. Continue heparin infusion at 1100 units/hr. HL daily w/ AM labs while therapeutic on heparin. CBC daily while on heparin.  Renda Rolls, PharmD, [facility] 01/21/2022 6:07 AM

## 2022-01-21 NOTE — Progress Notes (Signed)
NAME:  Kristin [name], MRN:  [id number], DOB:  [birth year], LOS: 4 ADMISSION DATE:  01/16/2022, CONSULTATION DATE: 01/17/2022 REFERRING MD: Dr. Damita Dunnings, CHIEF COMPLAINT: Weakness  History of Present Illness:  88 year old female presenting to [facility] ED from home via EMS on 01/16/2022 with complaints of lethargy and weakness.  Per the patient's granddaughter bedside, [name], patient was in her normal state of health until earlier in the evening on 01/16/2022 when she picked up the phone but did not respond.  Patient's granddaughter lives next door and walked over to check on her finding her sitting at the kitchen table with her head on the table.  She was responsive and able to talk but very weak.  Only other thing family noticed was that the patient was sleeping more than usual.  No other complaints that family was aware of.  01/18/22- patient was unable to get NGT advaced by RN staff after multiple attempts. We will try again with direct visual utilzing GLideScope.  Patient is making attempts at opening eyes and having spontaneous non jerky movements. Family in room we met and reivewed medical plan.    01/19/22- patient on SBT today, mentation still lethargic.  Weaning centrally acting meds for mental status. Renal function markedly improved. Reviewed plan with oncologist plan for repeat neuroimaging with CTA head  01/20/22- patient had prolonged SBT today. She remains with aggitation and but able to open eyes and was taking better breaths while on weaning trial to extubate.  After as short while she developed wheezing bilaterally and appeard to have labored breathing.  Family was at bedside and we agreed to let her rest tonight and try again tommorow for extubation.    01/21/22- patient extubated with family at bedside. She is communicative with encephalopathy. ABG post extubation ordered.   Pertinent  Medical History  Atrial fibrillation on Eliquis Right breast cancer 2004 HFpEF COPD COVID-19  06/11/2020 Gout Hepatitis C Hyperlipidemia Hypertension NSTEMI 04/08/2021 [facility] lung status post palliative radiation SBRT x5 fractions PPM in place Significant Hospital Events: Including procedures, antibiotic start and stop dates in addition to other pertinent events   01/17/2022: Admit to ICU with acute hypoxic respiratory failure secondary to suspected aspiration in the setting of acute encephalopathy    Objective   Blood pressure 107/62, pulse (!) 49, temperature 98.1 F (36.7 C), resp. rate 18, height 5' 7.01" (1.702 m), weight 79.4 kg, SpO2 100 %.    Vent Mode: PRVC FiO2 (%):  [28 %] 28 % Set Rate:  [20 bmp] 20 bmp Vt Set:  [470 mL] 470 mL PEEP:  [5 cmH20] 5 cmH20 Pressure Support:  [5 cmH20] 5 cmH20   Intake/Output Summary (Last 24 hours) at 01/21/2022 0845 Last data filed at 01/21/2022 0700 Gross per 24 hour  Intake 2341.55 ml  Output 955 ml  Net 1386.55 ml    Filed Weights   01/19/22 0500 01/20/22 0500 01/21/22 0459  Weight: 78.8 kg 78.9 kg 79.4 kg    Examination: General: Adult female, critically ill, lying in bed, lethargic, NAD HEENT: MM pink/moist, anicteric, atraumatic, neck supple Neuro: pverbalizing words positive movement to painful stimuli in any extremity CV: s1s2 RRR, NSR paced rhythm on monitor, no r/m/g Pulm: Regular, non labored on NRB 15 L, breath sounds coarse crackles-BUL & diminished-BLL GI: soft, rounded, non tender, bs x 4 GU: foley in place with clear yellow urine Skin: Limited exam- no rashes/lesions noted Extremities: warm/dry, pulses + 2 R/P, +2 edema noted BLE  Resolved Hospital Problem list  Assessment & Plan:  Acute Hypoxic / Hypercapnic Respiratory Failure secondary to suspected aspiration in the setting of possible CAP & acute HFpEF exacerbation  -post extubation ABG ordered    Suspected acute on Chronic HFpEF exacerbation Elevated Troponin secondary to suspected demand ischemia versus NSTEMI Chronic / Paroxysmal Atrial  Fibrillation Troponin: 62 BNP: Pending - Trend troponins  - Echocardiogram ordered - f/u BNP - Continuous cardiac monitoring  - Daily weights to assess volume status - Diurese with the use of IV lasix, as hemodynamics and renal function permit -Continue outpatient Eliquis  Acute Kidney Injury superimposed on CKD stage 3b  Baseline Cr: 1.18, Cr on admission:1.50 - Strict I/O's: alert provider if UOP < 0.5 mL/kg/hr - Daily BMP, replace electrolytes PRN - Avoid nephrotoxic agents as able, ensure adequate renal perfusion  Acute encephalopathy secondary to unknown etiology -Stat CT head without contrast - f/u ammonia level -Consider EEG/MRI brain if mentation does not improve with oxygenation support -Neurochecks every 4 -Avoid sedating medications as able - supportive care  Best Practice (right click and "Reselect all SmartList Selections" daily)  Diet/type: NPO w/ meds via tube DVT prophylaxis: DOAC GI prophylaxis: PPI Lines: N/A Foley:  Yes, and it is still needed Code Status:  full code Last date of multidisciplinary goals of care discussion [01/16/22] Updated granddaughter, HCPOA, via telephone.  Questions and concerns answered at this time.  Labs   CBC: Recent Labs  Lab 01/16/22 2342 01/17/22 0549 01/18/22 0357 01/19/22 0904 01/20/22 0106 01/21/22 0448  WBC 4.4 6.7 8.7 7.3 7.2  7.1 10.5  NEUTROABS 3.0  --   --   --  5.5  --   HGB 12.0 10.0* 12.1 10.3* 9.6*  9.7* 11.0*  HCT 40.5 35.2* 40.4 33.6* 31.6*  32.1* 36.1  MCV 90.4 92.4 91.2 88.4 88.5  88.4 88.0  PLT 180 204 194 173 158  154 162     Basic Metabolic Panel: Recent Labs  Lab 01/17/22 0549 01/18/22 0357 01/19/22 0904 01/20/22 0521 01/21/22 0448  NA 136 136 138 139 137  K 4.1 4.0 3.4* 3.2* 3.8  CL 106 104 108 108 109  CO2 _0 GLUCOSE 81 137* 110* 128* 151*  BUN _1 CREATININE 1.34* 1.36* 1.02* 1.06* 1.10*  CALCIUM 8.6* 8.8* 8.5* 8.5* 9.0  MG 2.1 2.1 2.0 1.9 2.0  PHOS  5.0* 2.9 2.4* 2.8 2.8    GFR: Estimated Creatinine Clearance: 36.9 mL/min (A) (by C-G formula based on SCr of 1.1 mg/dL (H)). Recent Labs  Lab 01/16/22 2343 01/17/22 0549 01/17/22 2256 01/18/22 0357 01/19/22 0904 01/20/22 0031 01/20/22 0106 01/21/22 0448  PROCALCITON  --  0.21  --  1.07 1.19 1.35  --   --   WBC  --  6.7  --  8.7 7.3  --  7.2  7.1 10.5  LATICACIDVEN 1.5  --  0.9  --   --   --   --   --      Liver Function Tests: Recent Labs  Lab 01/16/22 2342  AST 41  ALT 17  ALKPHOS 81  BILITOT 1.2  PROT 8.3*  ALBUMIN 3.3*    No results for input(s): LIPASE, AMYLASE in the last 168 hours. Recent Labs  Lab 01/17/22 0549  AMMONIA 46*     ABG    Component Value Date/Time   PHART 7.28 (L) 01/17/2022 0800   PCO2ART 49 (H) 01/17/2022 0800   PO2ART 76 (L) 01/17/2022 0800  HCO3 23.0 01/17/2022 0800   ACIDBASEDEF 4.1 (H) 01/17/2022 0800   O2SAT 96.1 01/17/2022 0800      Coagulation Profile: Recent Labs  Lab 01/19/22 0030  INR 1.7*     Cardiac Enzymes: No results for input(s): CKTOTAL, CKMB, CKMBINDEX, TROPONINI in the last 168 hours.  HbA1C: Hgb A1c MFr Bld  Date/Time Value Ref Range Status  11/05/2021 02:03 PM 6.1 (H) 4.8 - 5.6 % Final    Comment:    (NOTE)         Prediabetes: 5.7 - 6.4         Diabetes: >6.4         Glycemic control for adults with diabetes: <7.0   04/01/2020 08:07 AM 5.5 <5.7 % of total Hgb Final    Comment:    For the purpose of screening for the presence of diabetes: . <5.7%       Consistent with the absence of diabetes 5.7-6.4%    Consistent with increased risk for diabetes             (prediabetes) > or =6.5%  Consistent with diabetes . This assay result is consistent with a decreased risk of diabetes. . Currently, no consensus exists regarding use of hemoglobin A1c for diagnosis of diabetes in children. . According to American Diabetes Association (ADA) guidelines, hemoglobin A1c <7.0% represents  optimal control in non-pregnant diabetic patients. Different metrics may apply to specific patient populations.  Standards of Medical Care in Diabetes(ADA). .     CBG: Recent Labs  Lab 01/20/22 1558 01/20/22 1933 01/20/22 2331 01/21/22 0350 01/21/22 0721  GLUCAP 99 124* 121* 146* 148*     Review of Systems:   UTA patient unresponsive unable to participate in interview  Past Medical History:  She,  has a past medical history of Acute postoperative pain (02/09/2018), Arrhythmia, sinus node (04/03/2014), Arthritis, Arthritis, Atrial fibrillation ([place]), Back pain, Bradycardia, Breast cancer ([place]) (2004), Cancer ([place]) (2004), CHF (congestive heart failure) ([place]), Chronic back pain, CKD (chronic kidney disease), COPD (chronic obstructive pulmonary disease) ([place]), COVID-19 (06/11/2020), Cystocele, Decubitus ulcers, Dehydration, Gout, Hematuria, Hepatitis C, Hyperlipidemia, Hypertension, Hypomagnesemia (06/25/2015), NSTEMI (non-ST elevated myocardial infarction) ([place]) (04/08/2021), OAB (overactive bladder), Overactive bladder, Overdose opiate, accidental or unintentional, sequela (10/02/2020), Restless leg, Shoulder pain, bilateral, Urinary frequency, UTI (lower urinary tract infection), and Vaginal atrophy.   Surgical History:   Past Surgical History:  Procedure Laterality Date   ABDOMINAL HYSTERECTOMY     BACK SURGERY     BREAST LUMPECTOMY Right 2004   BREAST SURGERY     rt lumpectomy   CARPAL TUNNEL RELEASE     CATARACT EXTRACTION W/PHACO  10/19/2011   Procedure: CATARACT EXTRACTION PHACO AND INTRAOCULAR LENS PLACEMENT (IOC);  Surgeon: Elta Guadeloupe T. Gershon Crane, MD;  Location: [facility];  Service: Ophthalmology;  Laterality: Right;  CDE:10.81   CATARACT EXTRACTION W/PHACO  11/02/2011   Procedure: CATARACT EXTRACTION PHACO AND INTRAOCULAR LENS PLACEMENT (IOC);  Surgeon: Elta Guadeloupe T. Gershon Crane, MD;  Location: [facility];  Service: Ophthalmology;  Laterality: Left;  CDE 8.60   CHOLECYSTECTOMY     3/18   COLON  SURGERY     INSERT / REPLACE / REMOVE PACEMAKER     JOINT REPLACEMENT     bilateral TKA   LOWER EXTREMITY ANGIOGRAPHY Left 10/14/2020   Procedure: LOWER EXTREMITY ANGIOGRAPHY;  Surgeon: Katha Cabal, MD;  Location: [place] [facility];  Service: Cardiovascular;  Laterality: Left;   LOWER EXTREMITY ANGIOGRAPHY Right 09/08/2021   Procedure: LOWER EXTREMITY  ANGIOGRAPHY;  Surgeon: Katha Cabal, MD;  Location: [place] [facility];  Service: Cardiovascular;  Laterality: Right;   PACEMAKER INSERTION N/A 12/24/2015   Procedure: INSERTION PACEMAKER;  Surgeon: Isaias Cowman, MD;  Location: [facility] OR[facility];  Service: Cardiovascular;  Laterality: N/A;   ROTATOR CUFF REPAIR     bilateral   VIDEO BRONCHOSCOPY WITH ENDOBRONCHIAL NAVIGATION Right 11/11/2021   Procedure: VIDEO BRONCHOSCOPY WITH ENDOBRONCHIAL NAVIGATION;  Surgeon: Ottie Glazier, MD;  Location: [facility] OR[facility];  Service: Thoracic;  Laterality: Right;     Social History:   reports that she quit smoking about 23 years ago. Her smoking use included cigarettes. She has a 50.00 pack-year smoking history. She has never used smokeless tobacco. She reports current drug use. Drug: Oxycodone. She reports that she does not drink alcohol.   Family History:  Her family history includes Breast cancer in her paternal aunt; Breast cancer (age of onset: 76) in her sister; Cancer in her sister; Heart disease in her father and mother; Kidney disease in her brother; Stroke in her mother. There is no history of Anesthesia problems, Hypotension, Malignant hyperthermia, or Pseudochol deficiency.   Allergies Allergies  Allergen Reactions   Flexeril [Cyclobenzaprine Hcl]     Severe Rash   Vancomycin Rash    [place] Syndrome     Home Medications  Prior to Admission medications   Medication Sig Start Date End Date Taking? Authorizing Provider  acetaminophen (TYLENOL) 500 MG tablet Take 500 mg by mouth in the morning and at bedtime.   Yes [provider]  apixaban (ELIQUIS) 2.5 MG TABS tablet Take 1 tablet (2.5 mg total) by mouth 2 (two) times daily. 04/09/21  Yes Wieting, [name], MD  atorvastatin (LIPITOR) 20 MG tablet TAKE 1 TABLET BY MOUTH ONCE DAILY FOR CHOLESTEROL Patient taking differently: Take 20 mg by mouth daily. 02/17/21  Yes Karamalegos, Alexander J, DO  BREZTRI AEROSPHERE 160-9-4.8 MCG/ACT AERO Inhale 2 puffs into the lungs 2 (two) times daily. 07/07/21  Yes Karamalegos, Devonne Doughty, DO  Cholecalciferol (VITAMIN D) 2000 units tablet Take 2,000 Units by mouth daily.    Yes [provider]  empagliflozin (JARDIANCE) 10 MG TABS tablet Take 1 tablet (10 mg total) by mouth daily before breakfast. 12/29/21  Yes [name] A, FNP  furosemide (LASIX) 40 MG tablet Take 1 tablet (40 mg total) by mouth daily. 09/23/21  Yes Nicole Kindred A, DO  gabapentin (NEURONTIN) 300 MG capsule Take 1 capsule (300 mg total) by mouth 3 (three) times daily. 11/12/21  Yes Annita Brod, MD  GNP ASPIRIN LOW DOSE 81 MG EC tablet TAKE 1 TABLET BY MOUTH ONCE DAILY. *SWALLOW WHOLE* 08/18/21  Yes Schnier, Dolores Lory, MD  levocetirizine (XYZAL) 5 MG tablet TAKE (1/2) TABLET BY MOUTH EVERY OTHER DAY. 06/19/21  Yes Karamalegos, Devonne Doughty, DO  Melatonin 10 MG TABS Take 10 mg by mouth at bedtime.   Yes [provider]  Multiple Vitamin (MULTIVITAMIN WITH MINERALS) TABS tablet Take 1 tablet by mouth daily. 11/13/21  Yes Annita Brod, MD  oxyCODONE (OXY IR/ROXICODONE) 5 MG immediate release tablet Take 2 tablets (10 mg total) by mouth 2 (two) times daily. May also take 1 tablet (5 mg total) at bedtime as needed for severe pain. Must last 30 days.. 12/23/21 01/22/22 Yes Milinda Pointer, MD  rOPINIRole (REQUIP) 2 MG tablet TAKE 2 TABLETS BY MOUTH AT BEDTIME 10/21/21  Yes Karamalegos, Devonne Doughty, DO  albuterol (PROVENTIL) (2.5 MG/3ML) 0.083% nebulizer solution USE 1  VIAL IN NEBULIZER EVERY 8 HOURS AS NEEDED FOR WHEEZING OR SHORTNESS OF  BREATH. 12/22/21   Karamalegos, Devonne Doughty, DO  bisacodyl (DULCOLAX) 5 MG EC tablet Take 2 tablets (10 mg total) by mouth daily as needed for moderate constipation. 07/04/20   Karamalegos, Devonne Doughty, DO  diclofenac Sodium (VOLTAREN) 1 % GEL Apply 4 g topically 2 (two) times daily as needed (As needed for pain). 10/15/21   Kris Hartmann, NP  feeding supplement (ENSURE ENLIVE / ENSURE PLUS) LIQD Take 237 mLs by mouth 2 (two) times daily between meals. 11/13/21   Annita Brod, MD  oxyCODONE (OXY IR/ROXICODONE) 5 MG immediate release tablet Take 2 tablets (10 mg total) by mouth 2 (two) times daily. May also take 1 tablet (5 mg total) at bedtime as needed for severe pain. Must last 30 days.. 01/22/22 02/21/22  Milinda Pointer, MD  oxyCODONE (OXY IR/ROXICODONE) 5 MG immediate release tablet Take 2 tablets (10 mg total) by mouth 2 (two) times daily. May also take 1 tablet (5 mg total) at bedtime as needed for severe pain. Must last 30 days.. 02/21/22 03/23/22  Milinda Pointer, MD  OXYGEN Inhale 2 L into the lungs daily.    [provider]    Critical care provider statement:   Total critical care time: 33 minutes   Performed by: Lanney Gins MD   Critical care time was exclusive of separately billable procedures and treating other patients.   Critical care was necessary to treat or prevent imminent or life-threatening deterioration.   Critical care was time spent personally by me on the following activities: development of treatment plan with patient and/or surrogate as well as nursing, discussions with consultants, evaluation of patient's response to treatment, examination of patient, obtaining history from patient or surrogate, ordering and performing treatments and interventions, ordering and review of laboratory studies, ordering and review of radiographic studies, pulse oximetry and re-evaluation of patient's condition.    Ottie Glazier, M.D.  Pulmonary & Critical Care Medicine

## 2022-01-21 NOTE — Progress Notes (Signed)
PHARMACY CONSULT NOTE  Pharmacy Consult for Electrolyte Monitoring and Replacement   Recent Labs: Potassium (mmol/L)  Date Value  01/21/2022 3.8  09/06/2014 4.4   Magnesium (mg/dL)  Date Value  01/21/2022 2.0  01/16/2014 2.0   Calcium (mg/dL)  Date Value  01/21/2022 9.0   Calcium, Total (mg/dL)  Date Value  09/06/2014 9.0   Albumin (g/dL)  Date Value  01/16/2022 3.3 (L)  07/10/2014 3.2 (L)   Phosphorus (mg/dL)  Date Value  01/21/2022 2.8   Sodium (mmol/L)  Date Value  01/21/2022 137  09/06/2014 138     Assessment: 40 yof w/ PMH of CKD 3A, lung cancer s/p radiation, paroxysmal A-fib on anticoagulation, chronic diastolic CHF, HTN, restless leg syndrome, 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 Pharmacy is asked to follow and replace electrolytes  Goal of Therapy:  Potassium 4.0 - 5.1 mmol/L Magnesium 2.0 - 2.4 mg/dL All Other Electrolytes WNL  Plan:  10 meQ KCl IV x 3 Recheck electrolytes in am  Dallie Piles ,PharmD Clinical Pharmacist 01/21/2022 7:07 AM

## 2022-01-21 NOTE — Progress Notes (Signed)
0708: Bedside shift report with PM RN. Patient appears comfortable, family bedside. Patient vital signs stable, no obvious distress.   0920: MD rounding on patient. Plan of care discussed.   1020: Patient questionably intermittently following commands. Continues to look uncomfortable, eyes open, but not appropriately interactive.   1115: Patient following commands & nodding head appropriately. Patient extubated to 2L [place] by RT with RN bedside. Patient maintaining oxygen saturation, and RR even & unlabored. Patient son remains bedside.   1145: CPT complete as patient has weak, congested cough.   1215: Patient remains on [place] maintaining unlabored RR and oxygen saturation. Family members bedside, patient follows commands and interacting appropriately.   1420: CHG bath provided. Patient tolerated repositioning with bed flat on [place] maintaining oxygen saturation 94% and above.    1800: Oral care provided and patient repositioned q2hr this shift.

## 2022-01-21 NOTE — Progress Notes (Signed)
Nutrition Follow-up  DOCUMENTATION CODES:   Non-severe (moderate) malnutrition in context of chronic illness  INTERVENTION:   RD will monitor for diet advancement vs the need for nutrition support.   If nasogastric tube placed, recommend:  Osmolite 1.5'@50ml' /hr- Initiate at 22m/hr and increase by 165mhr q8 hours until goal rate is reached.   Pro-Source 4517maily via tube, provides 40kcal and 11g of protein per serving   Free water flushes 100m15m hours   Regimen 1840kcal/day, 86g/day protein and 1514ml53m of free water  Pt at high refeed risk; recommend monitor potassium, magnesium and phosphorus labs daily until stable  NUTRITION DIAGNOSIS:   Moderate Malnutrition related to chronic illness (COPD) as evidenced by mild fat depletion, moderate fat depletion, mild muscle depletion, moderate muscle depletion.  GOAL:   Patient will meet greater than or equal to 90% of their needs -not met   MONITOR:   Diet advancement, Labs, Weight trends, Skin, I & O's  ASSESSMENT:   89 y/18female with h/o breast cancer s/p lumpectomy/chemo/XRT, CHF, CKD III, Afib, COPD, lung cancer, pacemaker, OVD, DDD, gout, GERD, HTN and COVID 19 (2021) who is admitted with acute hypoxic / hypercapnic respiratory failure secondary to suspected aspiration in the setting of possible CAP & acute HFpEF exacerbation, AKI and encephalopathy.  Pt extubated today. Pt was never able to gain enteral access. Pt will need SLP evaluation. If pt is unable to be initiated on an oral diet within the next 24hrs, recommend fluoroscopy guided nasogastric tube placement and nutrition support. Pt is at high refeed risk. Per chart, pt is up ~8lbs since admission; pt +4.0L on her I & Os.   Medications reviewed and include: aspirin, colace, protonix, miralax, 10% dextrose '@50ml' /hr, heparin, levophed   Labs reviewed: K 3.8 wnl, creat 1.10(H), P 2.8 wnl, Mg 2.0 wnl BNP- 1345(H)- 5/28 Cbgs- 132, 148, 146 x 24 hrs  Diet Order:    Diet Order             Diet NPO time specified  Diet effective now                  EDUCATION NEEDS:   Not appropriate for education at this time  Skin:  Skin Assessment: Reviewed RN Assessment  Last BM:  5/27  Height:   Ht Readings from Last 1 Encounters:  01/21/22 5' 7.01" (1.702 m)    Weight:   Wt Readings from Last 1 Encounters:  01/21/22 79.4 kg    Ideal Body Weight:  61.4 kg  BMI:  Body mass index is 27.41 kg/m.  Estimated Nutritional Needs:   Kcal:  1600-1800kcal/day  Protein:  80-90g/day  Fluid:  1.6-1.8L/day  CaseyKoleen DistanceRD, LDN Please refer to [facility]ION[facility] CenterRD and/or RD on-call/weekend/after hours pager

## 2022-01-21 NOTE — Progress Notes (Signed)
Patient extubated to 2lnc with no complications, Per MD request. Pt tolerating well at this time.

## 2022-01-22 DIAGNOSIS — Z7189 Other specified counseling: Secondary | ICD-10-CM

## 2022-01-22 DIAGNOSIS — Z515 Encounter for palliative care: Secondary | ICD-10-CM

## 2022-01-22 DIAGNOSIS — J9621 Acute and chronic respiratory failure with hypoxia: Secondary | ICD-10-CM

## 2022-01-22 DIAGNOSIS — I5032 Chronic diastolic (congestive) heart failure: Secondary | ICD-10-CM

## 2022-01-22 DIAGNOSIS — N1831 Chronic kidney disease, stage 3a: Secondary | ICD-10-CM

## 2022-01-22 DIAGNOSIS — Z79891 Long term (current) use of opiate analgesic: Secondary | ICD-10-CM

## 2022-01-22 DIAGNOSIS — E440 Moderate protein-calorie malnutrition: Secondary | ICD-10-CM

## 2022-01-22 DIAGNOSIS — N179 Acute kidney failure, unspecified: Secondary | ICD-10-CM

## 2022-01-22 DIAGNOSIS — Z7901 Long term (current) use of anticoagulants: Secondary | ICD-10-CM

## 2022-01-22 DIAGNOSIS — J189 Pneumonia, unspecified organism: Secondary | ICD-10-CM | POA: Diagnosis not present

## 2022-01-22 DIAGNOSIS — J449 Chronic obstructive pulmonary disease, unspecified: Secondary | ICD-10-CM

## 2022-01-22 DIAGNOSIS — R531 Weakness: Secondary | ICD-10-CM

## 2022-01-22 DIAGNOSIS — I480 Paroxysmal atrial fibrillation: Secondary | ICD-10-CM

## 2022-01-22 DIAGNOSIS — C3411 Malignant neoplasm of upper lobe, right bronchus or lung: Secondary | ICD-10-CM

## 2022-01-22 LAB — HEPARIN LEVEL (UNFRACTIONATED): Heparin Unfractionated: 0.34 IU/mL (ref 0.30–0.70)

## 2022-01-22 LAB — BASIC METABOLIC PANEL
Anion gap: 3 — ABNORMAL LOW (ref 5–15)
BUN: 19 mg/dL (ref 8–23)
CO2: 25 mmol/L (ref 22–32)
Calcium: 9.2 mg/dL (ref 8.9–10.3)
Chloride: 109 mmol/L (ref 98–111)
Creatinine, Ser: 0.91 mg/dL (ref 0.44–1.00)
GFR, Estimated: 60 mL/min — ABNORMAL LOW (ref >60)
Glucose, Bld: 117 mg/dL — ABNORMAL HIGH (ref 70–99)
Potassium: 3.6 mmol/L (ref 3.5–5.1)
Sodium: 137 mmol/L (ref 135–145)

## 2022-01-22 LAB — GLUCOSE, CAPILLARY
Glucose-Capillary: 94 mg/dL (ref 70–99)
Glucose-Capillary: 89 mg/dL (ref 70–99)
Glucose-Capillary: 89 mg/dL (ref 70–99)
Glucose-Capillary: 73 mg/dL (ref 70–99)
Glucose-Capillary: 80 mg/dL (ref 70–99)
Glucose-Capillary: 67 mg/dL — ABNORMAL LOW (ref 70–99)

## 2022-01-22 LAB — BLOOD GAS, ARTERIAL
Acid-base deficit: 0.2 mmol/L (ref 0.0–2.0)
Bicarbonate: 22.6 mmol/L (ref 20.0–28.0)
FIO2: 0.32 %
O2 Saturation: 87.5 %
Patient temperature: 37
pCO2 arterial: 31 mmHg — ABNORMAL LOW (ref 32–48)
pH, Arterial: 7.47 — ABNORMAL HIGH (ref 7.35–7.45)
pO2, Arterial: 53 mmHg — ABNORMAL LOW (ref 83–108)

## 2022-01-22 LAB — PHOSPHORUS: Phosphorus: 2.5 mg/dL (ref 2.5–4.6)

## 2022-01-22 LAB — CBC
HCT: 31.8 % — ABNORMAL LOW (ref 36.0–46.0)
Hemoglobin: 9.8 g/dL — ABNORMAL LOW (ref 12.0–15.0)
MCH: 26.6 pg (ref 26.0–34.0)
MCHC: 30.8 g/dL (ref 30.0–36.0)
MCV: 86.2 fL (ref 80.0–100.0)
Platelets: 129 10*3/uL — ABNORMAL LOW (ref 150–400)
RBC: 3.69 MIL/uL — ABNORMAL LOW (ref 3.87–5.11)
RDW: 18.3 % — ABNORMAL HIGH (ref 11.5–15.5)
WBC: 7 10*3/uL (ref 4.0–10.5)
nRBC: 0 % (ref 0.0–0.2)

## 2022-01-22 LAB — MAGNESIUM: Magnesium: 2 mg/dL (ref 1.7–2.4)

## 2022-01-22 MED ORDER — CLONIDINE HCL 0.2 MG/24HR TD PTWK
0.2000 mg | MEDICATED_PATCH | TRANSDERMAL | Status: DC
Start: 2022-01-22 — End: 2022-01-22
  Administered 2022-01-22: 0.2 mg via TRANSDERMAL
  Filled 2022-01-22: qty 1

## 2022-01-22 MED ORDER — PANTOPRAZOLE SODIUM 40 MG IV SOLR
40.0000 mg | INTRAVENOUS | Status: DC
  Administered 2022-01-22 – 2022-01-26 (×5): 40 mg via INTRAVENOUS
  Filled 2022-01-22 (×5): qty 10

## 2022-01-22 MED ORDER — DEXTROSE 10 % IV SOLN: INTRAVENOUS | Status: AC

## 2022-01-22 MED ORDER — DEXTROSE 50 % IV SOLN
25.0000 mL | Freq: Once | INTRAVENOUS | Status: AC
  Administered 2022-01-22: 25 mL via INTRAVENOUS
  Filled 2022-01-22: qty 50

## 2022-01-22 MED ORDER — POTASSIUM CHLORIDE 10 MEQ/100ML IV SOLN
10.0000 meq | INTRAVENOUS | Status: AC
  Administered 2022-01-22 (×2): 10 meq via INTRAVENOUS
  Filled 2022-01-22 (×2): qty 100

## 2022-01-22 MED ORDER — GLYCOPYRROLATE 0.2 MG/ML IJ SOLN
0.2000 mg | Freq: Two times a day (BID) | INTRAMUSCULAR | Status: DC
Start: 2022-01-22 — End: 2022-01-23
  Administered 2022-01-22 – 2022-01-23 (×3): 0.2 mg via INTRAVENOUS
  Filled 2022-01-22 (×3): qty 1

## 2022-01-22 NOTE — Progress Notes (Signed)
NAME:  Kristin [name], MRN:  [id number], DOB:  21-Jan-1930, LOS: 5 ADMISSION DATE:  01/16/2022, CONSULTATION DATE: 01/17/2022 REFERRING MD: Dr. Damita Dunnings, CHIEF COMPLAINT: Weakness  History of Present Illness:  88 year old female presenting to [facility] ED from home via EMS on 01/16/2022 with complaints of lethargy and weakness.  Per the patient's granddaughter bedside, [name], patient was in her normal state of health until earlier in the evening on 01/16/2022 when she picked up the phone but did not respond.  Patient's granddaughter lives next door and walked over to check on her finding her sitting at the kitchen table with her head on the table.  She was responsive and able to talk but very weak.  Only other thing family noticed was that the patient was sleeping more than usual.  No other complaints that family was aware of.  01/18/22- patient was unable to get NGT advaced by RN staff after multiple attempts. We will try again with direct visual utilzing GLideScope.  Patient is making attempts at opening eyes and having spontaneous non jerky movements. Family in room we met and reivewed medical plan.    01/19/22- patient on SBT today, mentation still lethargic.  Weaning centrally acting meds for mental status. Renal function markedly improved. Reviewed plan with oncologist plan for repeat neuroimaging with CTA head  01/20/22- patient had prolonged SBT today. She remains with aggitation and but able to open eyes and was taking better breaths while on weaning trial to extubate.  After as short while she developed wheezing bilaterally and appeard to have labored breathing.  Family was at bedside and we agreed to let her rest tonight and try again tommorow for extubation.    01/21/22- patient extubated with family at bedside. She is communicative with encephalopathy. ABG post extubation ordered.   01/22/22- patient seems to be struggling off ventilator and is with overall poor prognosis. She would benefit from  palliative discussion. Palliative care evaluation - patient wants fulll code  Pertinent  Medical History  Atrial fibrillation on Eliquis Right breast cancer 2004 HFpEF COPD COVID-19 06/11/2020 Gout Hepatitis C Hyperlipidemia Hypertension NSTEMI 04/08/2021 SCC lung status post palliative radiation SBRT x5 fractions PPM in place Significant Hospital Events: Including procedures, antibiotic start and stop dates in addition to other pertinent events   01/17/2022: Admit to ICU with acute hypoxic respiratory failure secondary to suspected aspiration in the setting of acute encephalopathy    Objective   Blood pressure 110/74, pulse 72, temperature 98.9 F (37.2 C), temperature source Axillary, resp. rate (!) 23, height 5' 7.01" (1.702 m), weight 77.2 kg, SpO2 100 %.        Intake/Output Summary (Last 24 hours) at 01/22/2022 0956 Last data filed at 01/22/2022 0540 Gross per 24 hour  Intake 1229.66 ml  Output 790 ml  Net 439.66 ml    Filed Weights   01/20/22 0500 01/21/22 0459 01/22/22 0500  Weight: 78.9 kg 79.4 kg 77.2 kg    Examination: General: Adult female, critically ill, lying in bed, lethargic, NAD HEENT: MM pink/moist, anicteric, atraumatic, neck supple Neuro: pverbalizing words positive movement to painful stimuli in any extremity CV: s1s2 RRR, NSR paced rhythm on monitor, no r/m/g Pulm: Regular, non labored on NRB 15 L, breath sounds coarse crackles-BUL & diminished-BLL GI: soft, rounded, non tender, bs x 4 GU: foley in place with clear yellow urine Skin: Limited exam- no rashes/lesions noted Extremities: warm/dry, pulses + 2 R/P, +2 edema noted BLE  Resolved Hospital Problem list  Assessment & Plan:  Acute Hypoxic / Hypercapnic Respiratory Failure secondary to suspected aspiration in the setting of possible CAP & acute HFpEF exacerbation  -post extubation ABG ordered    Suspected acute on Chronic HFpEF exacerbation Elevated Troponin secondary to suspected  demand ischemia versus NSTEMI Chronic / Paroxysmal Atrial Fibrillation Troponin: 62 BNP: Pending - Trend troponins  - Echocardiogram ordered - f/u BNP - Continuous cardiac monitoring  - Daily weights to assess volume status - Diurese with the use of IV lasix, as hemodynamics and renal function permit -Continue outpatient Eliquis  Acute Kidney Injury superimposed on CKD stage 3b  Baseline Cr: 1.18, Cr on admission:1.50 - Strict I/O's: alert provider if UOP < 0.5 mL/kg/hr - Daily BMP, replace electrolytes PRN - Avoid nephrotoxic agents as able, ensure adequate renal perfusion  Acute encephalopathy secondary to unknown etiology -Stat CT head without contrast - f/u ammonia level -Consider EEG/MRI brain if mentation does not improve with oxygenation support -Neurochecks every 4 -Avoid sedating medications as able - supportive care  Best Practice (right click and "Reselect all SmartList Selections" daily)  Diet/type: NPO w/ meds via tube DVT prophylaxis: DOAC GI prophylaxis: PPI Lines: N/A Foley:  Yes, and it is still needed Code Status:  full code Last date of multidisciplinary goals of care discussion [01/16/22] Updated granddaughter, HCPOA, via telephone.  Questions and concerns answered at this time.  Labs   CBC: Recent Labs  Lab 01/16/22 2342 01/17/22 0549 01/18/22 0357 01/19/22 0904 01/20/22 0106 01/21/22 0448 01/22/22 0445  WBC 4.4   < > 8.7 7.3 7.2  7.1 10.5 7.0  NEUTROABS 3.0  --   --   --  5.5  --   --   HGB 12.0   < > 12.1 10.3* 9.6*  9.7* 11.0* 9.8*  HCT 40.5   < > 40.4 33.6* 31.6*  32.1* 36.1 31.8*  MCV 90.4   < > 91.2 88.4 88.5  88.4 88.0 86.2  PLT 180   < > 194 173 158  154 162 129*   < > = values in this interval not displayed.     Basic Metabolic Panel: Recent Labs  Lab 01/18/22 0357 01/19/22 0904 01/20/22 0521 01/21/22 0448 01/22/22 0445  NA 136 138 139 137 137  K 4.0 3.4* 3.2* 3.8 3.6  CL 104 108 108 109 109  CO2 '23 24 24 25 25   ' GLUCOSE 137* 110* 128* 151* 117*  BUN '18 17 17 19 19  ' CREATININE 1.36* 1.02* 1.06* 1.10* 0.91  CALCIUM 8.8* 8.5* 8.5* 9.0 9.2  MG 2.1 2.0 1.9 2.0 2.0  PHOS 2.9 2.4* 2.8 2.8 2.5    GFR: Estimated Creatinine Clearance: 44 mL/min (by C-G formula based on SCr of 0.91 mg/dL). Recent Labs  Lab 01/16/22 2343 01/17/22 0549 01/17/22 2256 01/18/22 0357 01/19/22 0904 01/20/22 0031 01/20/22 0106 01/21/22 0448 01/22/22 0445  PROCALCITON  --  0.21  --  1.07 1.19 1.35  --   --   --   WBC  --  6.7  --  8.7 7.3  --  7.2  7.1 10.5 7.0  LATICACIDVEN 1.5  --  0.9  --   --   --   --   --   --      Liver Function Tests: Recent Labs  Lab 01/16/22 2342  AST 41  ALT 17  ALKPHOS 81  BILITOT 1.2  PROT 8.3*  ALBUMIN 3.3*    No results for input(s): LIPASE, AMYLASE in the last  168 hours. Recent Labs  Lab 01/17/22 0549  AMMONIA 46*     ABG    Component Value Date/Time   PHART 7.28 (L) 01/17/2022 0800   PCO2ART 49 (H) 01/17/2022 0800   PO2ART 76 (L) 01/17/2022 0800   HCO3 23.0 01/17/2022 0800   ACIDBASEDEF 4.1 (H) 01/17/2022 0800   O2SAT 96.1 01/17/2022 0800      Coagulation Profile: Recent Labs  Lab 01/19/22 0030  INR 1.7*     Cardiac Enzymes: No results for input(s): CKTOTAL, CKMB, CKMBINDEX, TROPONINI in the last 168 hours.  HbA1C: Hgb A1c MFr Bld  Date/Time Value Ref Range Status  11/05/2021 02:03 PM 6.1 (H) 4.8 - 5.6 % Final    Comment:    (NOTE)         Prediabetes: 5.7 - 6.4         Diabetes: >6.4         Glycemic control for adults with diabetes: <7.0   04/01/2020 08:07 AM 5.5 <5.7 % of total Hgb Final    Comment:    For the purpose of screening for the presence of diabetes: . <5.7%       Consistent with the absence of diabetes 5.7-6.4%    Consistent with increased risk for diabetes             (prediabetes) > or =6.5%  Consistent with diabetes . This assay result is consistent with a decreased risk of diabetes. . Currently, no consensus exists  regarding use of hemoglobin A1c for diagnosis of diabetes in children. . According to American Diabetes Association (ADA) guidelines, hemoglobin A1c <7.0% represents optimal control in non-pregnant diabetic patients. Different metrics may apply to specific patient populations.  Standards of Medical Care in Diabetes(ADA). .     CBG: Recent Labs  Lab 01/21/22 1530 01/21/22 1941 01/21/22 2336 01/22/22 0406 01/22/22 0743  GLUCAP 104* 95 103* 94 89     Review of Systems:   UTA patient unresponsive unable to participate in interview  Past Medical History:  She,  has a past medical history of Acute postoperative pain (02/09/2018), Arrhythmia, sinus node (04/03/2014), Arthritis, Arthritis, Atrial fibrillation ([place]), Back pain, Bradycardia, Breast cancer ([place]) (2004), Cancer ([place]) (2004), CHF (congestive heart failure) ([place]), Chronic back pain, CKD (chronic kidney disease), COPD (chronic obstructive pulmonary disease) ([place]), COVID-19 (06/11/2020), Cystocele, Decubitus ulcers, Dehydration, Gout, Hematuria, Hepatitis C, Hyperlipidemia, Hypertension, Hypomagnesemia (06/25/2015), NSTEMI (non-ST elevated myocardial infarction) ([place]) (04/08/2021), OAB (overactive bladder), Overactive bladder, Overdose opiate, accidental or unintentional, sequela (10/02/2020), Restless leg, Shoulder pain, bilateral, Urinary frequency, UTI (lower urinary tract infection), and Vaginal atrophy.   Surgical History:   Past Surgical History:  Procedure Laterality Date   ABDOMINAL HYSTERECTOMY     BACK SURGERY     BREAST LUMPECTOMY Right 2004   BREAST SURGERY     rt lumpectomy   CARPAL TUNNEL RELEASE     CATARACT EXTRACTION W/PHACO  10/19/2011   Procedure: CATARACT EXTRACTION PHACO AND INTRAOCULAR LENS PLACEMENT (IOC);  Surgeon: Elta Guadeloupe T. Gershon Crane, MD;  Location: [facility];  Service: Ophthalmology;  Laterality: Right;  CDE:10.81   CATARACT EXTRACTION W/PHACO  11/02/2011   Procedure: CATARACT EXTRACTION PHACO AND INTRAOCULAR  LENS PLACEMENT (IOC);  Surgeon: Elta Guadeloupe T. Gershon Crane, MD;  Location: [facility];  Service: Ophthalmology;  Laterality: Left;  CDE 8.60   CHOLECYSTECTOMY     3/18   COLON SURGERY     INSERT / REPLACE / REMOVE PACEMAKER     JOINT REPLACEMENT  bilateral TKA   LOWER EXTREMITY ANGIOGRAPHY Left 10/14/2020   Procedure: LOWER EXTREMITY ANGIOGRAPHY;  Surgeon: Katha Cabal, MD;  Location: [place] [facility];  Service: Cardiovascular;  Laterality: Left;   LOWER EXTREMITY ANGIOGRAPHY Right 09/08/2021   Procedure: LOWER EXTREMITY ANGIOGRAPHY;  Surgeon: Katha Cabal, MD;  Location: [place];  Service: Cardiovascular;  Laterality: Right;   PACEMAKER INSERTION N/A 12/24/2015   Procedure: INSERTION PACEMAKER;  Surgeon: Isaias Cowman, MD;  Location: [facility];  Service: Cardiovascular;  Laterality: N/A;   ROTATOR CUFF REPAIR     bilateral   VIDEO BRONCHOSCOPY WITH ENDOBRONCHIAL NAVIGATION Right 11/11/2021   Procedure: VIDEO BRONCHOSCOPY WITH ENDOBRONCHIAL NAVIGATION;  Surgeon: Ottie Glazier, MD;  Location: [facility];  Service: Thoracic;  Laterality: Right;     Social History:   reports that she quit smoking about 23 years ago. Her smoking use included cigarettes. She has a 50.00 pack-year smoking history. She has never used smokeless tobacco. She reports current drug use. Drug: Oxycodone. She reports that she does not drink alcohol.   Family History:  Her family history includes Breast cancer in her paternal aunt; Breast cancer (age of onset: 42) in her sister; Cancer in her sister; Heart disease in her father and mother; Kidney disease in her brother; Stroke in her mother. There is no history of Anesthesia problems, Hypotension, Malignant hyperthermia, or Pseudochol deficiency.   Allergies Allergies  Allergen Reactions   Flexeril [Cyclobenzaprine Hcl]     Severe Rash   Vancomycin Rash    [place] Syndrome     Home Medications  Prior to Admission medications   Medication Sig  Start Date End Date Taking? Authorizing Provider  acetaminophen (TYLENOL) 500 MG tablet Take 500 mg by mouth in the morning and at bedtime.   Yes [provider]  apixaban (ELIQUIS) 2.5 MG TABS tablet Take 1 tablet (2.5 mg total) by mouth 2 (two) times daily. 04/09/21  Yes Wieting, [name], MD  atorvastatin (LIPITOR) 20 MG tablet TAKE 1 TABLET BY MOUTH ONCE DAILY FOR CHOLESTEROL Patient taking differently: Take 20 mg by mouth daily. 02/17/21  Yes Karamalegos, Alexander J, DO  BREZTRI AEROSPHERE 160-9-4.8 MCG/ACT AERO Inhale 2 puffs into the lungs 2 (two) times daily. 07/07/21  Yes Karamalegos, Devonne Doughty, DO  Cholecalciferol (VITAMIN D) 2000 units tablet Take 2,000 Units by mouth daily.    Yes [provider]  empagliflozin (JARDIANCE) 10 MG TABS tablet Take 1 tablet (10 mg total) by mouth daily before breakfast. 12/29/21  Yes [name] A, FNP  furosemide (LASIX) 40 MG tablet Take 1 tablet (40 mg total) by mouth daily. 09/23/21  Yes Nicole Kindred A, DO  gabapentin (NEURONTIN) 300 MG capsule Take 1 capsule (300 mg total) by mouth 3 (three) times daily. 11/12/21  Yes Annita Brod, MD  GNP ASPIRIN LOW DOSE 81 MG EC tablet TAKE 1 TABLET BY MOUTH ONCE DAILY. *SWALLOW WHOLE* 08/18/21  Yes Schnier, Dolores Lory, MD  levocetirizine (XYZAL) 5 MG tablet TAKE (1/2) TABLET BY MOUTH EVERY OTHER DAY. 06/19/21  Yes Karamalegos, Devonne Doughty, DO  Melatonin 10 MG TABS Take 10 mg by mouth at bedtime.   Yes [provider]  Multiple Vitamin (MULTIVITAMIN WITH MINERALS) TABS tablet Take 1 tablet by mouth daily. 11/13/21  Yes Annita Brod, MD  oxyCODONE (OXY IR/ROXICODONE) 5 MG immediate release tablet Take 2 tablets (10 mg total) by mouth 2 (two) times daily. May also take 1 tablet (5 mg total) at bedtime  as needed for severe pain. Must last 30 days.. 12/23/21 01/22/22 Yes Milinda Pointer, MD  rOPINIRole (REQUIP) 2 MG tablet TAKE 2 TABLETS BY MOUTH AT BEDTIME 10/21/21  Yes Karamalegos,  Alexander J, DO  albuterol (PROVENTIL) (2.5 MG/3ML) 0.083% nebulizer solution USE 1 VIAL IN NEBULIZER EVERY 8 HOURS AS NEEDED FOR WHEEZING OR SHORTNESS OF BREATH. 12/22/21   Karamalegos, Devonne Doughty, DO  bisacodyl (DULCOLAX) 5 MG EC tablet Take 2 tablets (10 mg total) by mouth daily as needed for moderate constipation. 07/04/20   Karamalegos, Devonne Doughty, DO  diclofenac Sodium (VOLTAREN) 1 % GEL Apply 4 g topically 2 (two) times daily as needed (As needed for pain). 10/15/21   Kris Hartmann, NP  feeding supplement (ENSURE ENLIVE / ENSURE PLUS) LIQD Take 237 mLs by mouth 2 (two) times daily between meals. 11/13/21   Annita Brod, MD  oxyCODONE (OXY IR/ROXICODONE) 5 MG immediate release tablet Take 2 tablets (10 mg total) by mouth 2 (two) times daily. May also take 1 tablet (5 mg total) at bedtime as needed for severe pain. Must last 30 days.. 01/22/22 02/21/22  Milinda Pointer, MD  oxyCODONE (OXY IR/ROXICODONE) 5 MG immediate release tablet Take 2 tablets (10 mg total) by mouth 2 (two) times daily. May also take 1 tablet (5 mg total) at bedtime as needed for severe pain. Must last 30 days.. 02/21/22 03/23/22  Milinda Pointer, MD  OXYGEN Inhale 2 L into the lungs daily.    [provider]    Critical care provider statement:   Total critical care time: 33 minutes   Performed by: Lanney Gins MD   Critical care time was exclusive of separately billable procedures and treating other patients.   Critical care was necessary to treat or prevent imminent or life-threatening deterioration.   Critical care was time spent personally by me on the following activities: development of treatment plan with patient and/or surrogate as well as nursing, discussions with consultants, evaluation of patient's response to treatment, examination of patient, obtaining history from patient or surrogate, ordering and performing treatments and interventions, ordering and review of laboratory studies, ordering and  review of radiographic studies, pulse oximetry and re-evaluation of patient's condition.    Ottie Glazier, M.D.  Pulmonary & Critical Care Medicine

## 2022-01-22 NOTE — Progress Notes (Incomplete)
0740 Patient c/o chronic back pain. States medications not very effective for pain management.  0745 MD notified of inc BP and patients pain.Will continue to monitor and give PRN medications.   0800 Pain medication given.  Signy.Helper MD at beside. New orders placed.  0815 Pain controlled with medication per patient. Patient BP decreased.  1000 Medication given for secretions, effective.   1430 Palliative consulted. Spoke with family regarding goals of care.   1500 Speech evaluation placed.  1500 EKG placed in chart. MD notified of irregularities with pacing. No new orders.  35 MD updated family at bedside.

## 2022-01-22 NOTE — Progress Notes (Signed)
PHARMACY CONSULT NOTE  Pharmacy Consult for Electrolyte Monitoring and Replacement   Recent Labs: Potassium (mmol/L)  Date Value  01/22/2022 3.6  09/06/2014 4.4   Magnesium (mg/dL)  Date Value  01/22/2022 2.0  01/16/2014 2.0   Calcium (mg/dL)  Date Value  01/22/2022 9.2   Calcium, Total (mg/dL)  Date Value  09/06/2014 9.0   Albumin (g/dL)  Date Value  01/16/2022 3.3 (L)  07/10/2014 3.2 (L)   Phosphorus (mg/dL)  Date Value  01/22/2022 2.5   Sodium (mmol/L)  Date Value  01/22/2022 137  09/06/2014 138     Assessment: 60 yof w/ PMH of CKD 3A, lung cancer s/p radiation, paroxysmal A-fib on anticoagulation, chronic diastolic CHF, HTN, restless leg syndrome, 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 Pharmacy is asked to follow and replace electrolytes  Goal of Therapy:  Potassium 4.0 - 5.1 mmol/L Magnesium 2.0 - 2.4 mg/dL All Other Electrolytes WNL  Plan:  10 meQ KCl IV x 2 Recheck electrolytes in am  Dallie Piles ,PharmD Clinical Pharmacist 01/22/2022 7:01 AM

## 2022-01-22 NOTE — TOC Initial Note (Signed)
Transition of Care [facility]) - Initial/Assessment Note    Patient Details  Name: Kristin [name] MRN: [id number] Date of Birth: Oct 12, 1929  Transition of Care [facility]) CM/SW Contact:    Shelbie Hutching, RN Phone Number: 01/22/2022, 12:36 PM  Clinical Narrative:                 Patient admitted to the hospital for pneumonia initially requiring intubation.  Patient was extubated on yesterday 6/1, she is currently tolerating [place] at 3L.  Patient is on chronic oxygen at home.  Patient lives with her son and her granddaughter, [name], who is her POA lives right next door.  RNCM met with patient at the bedside, she is lethargic, she responds to her name and says "hi" but she is unable to tell me where she is. Patient has been open with [facility] for home health services.  In the past the patient and family have not been open to SNF.  Palliative has been consulted.  TOC will cont to follow.   Expected Discharge Plan:  (TBD) Barriers to Discharge: Continued Medical Work up   Patient Goals and CMS Choice Patient states their goals for this hospitalization and ongoing recovery are:: patient is not completely oriented      Expected Discharge Plan and Services Expected Discharge Plan:  (TBD)   Discharge Planning Services: CM Consult   Living arrangements for the past 2 months: Single Family Home                 DME Arranged: N/A                    Prior Living Arrangements/Services Living arrangements for the past 2 months: Single Family Home Lives with:: Self Patient language and need for interpreter reviewed:: Yes        Need for Family Participation in Patient Care: Yes (Comment) Care giver support system in place?: Yes (comment) (son and granddaughter) Current home services: DME (rollator, bedside commode, oxygen, nebulizer) Criminal Activity/Legal Involvement Pertinent to Current Situation/Hospitalization: No - Comment as needed  Activities of Daily Living Home Assistive  Devices/Equipment: None ADL Screening (condition at time of admission) Patient's cognitive ability adequate to safely complete daily activities?: No Is the patient deaf or have difficulty hearing?: No Does the patient have difficulty seeing, even when wearing glasses/contacts?: No Does the patient have difficulty concentrating, remembering, or making decisions?: No Patient able to express need for assistance with ADLs?: Yes Does the patient have difficulty dressing or bathing?: No Independently performs ADLs?: No Communication: Independent Dressing (OT): Needs assistance Is this a change from baseline?: Change from baseline, expected to last <3days Grooming: Needs assistance Is this a change from baseline?: Change from baseline, expected to last <3 days Feeding: Needs assistance Is this a change from baseline?: Change from baseline, expected to last <3 days Bathing: Needs assistance Is this a change from baseline?: Change from baseline, expected to last <3 days Toileting: Needs assistance Is this a change from baseline?: Change from baseline, expected to last <3 days In/Out Bed: Needs assistance Is this a change from baseline?: Change from baseline, expected to last <3 days Walks in Home: Independent Does the patient have difficulty walking or climbing stairs?: Yes Weakness of Legs: Both Weakness of Arms/Hands: Both  Permission Sought/Granted                  Emotional Assessment Appearance:: Appears stated age Attitude/Demeanor/Rapport: Lethargic Affect (typically observed): Pleasant, Quiet Orientation: : Oriented  to Self Alcohol / Substance Use: Not Applicable Psych Involvement: No (comment)  Admission diagnosis:  Acute respiratory failure (HCC) [J96.00] Weakness generalized [R53.1] AKI (acute kidney injury) ([place]) [N17.9] Multifocal pneumonia [J18.9] Community acquired pneumonia, unspecified laterality [J18.9] Patient Active Problem List   Diagnosis Date Noted    Malnutrition of moderate degree 01/18/2022   Acute respiratory failure ([place]) 58/85/0277   Acute metabolic encephalopathy/unresponsive    Sepsis ([place]) 12/18/2021   Palliative care encounter    Hypertension 12/17/2021   AMS (altered mental status) 12/17/2021   Malignant neoplasm of right upper lobe of lung ([place]) 12/02/2021   Small cell lung cancer ([place]) 11/17/2021   Goals of care, counseling/discussion 11/17/2021   Overweight (BMI 25.0-29.9) 11/11/2021   Bacteremia 11/06/2021   Multifocal pneumonia 11/05/2021   Mass of upper lobe of right lung 11/05/2021   Sepsis due to Escherichia coli (E. coli) ([place]) 11/05/2021   Dyslipidemia 11/05/2021   COPD (chronic obstructive pulmonary disease) ([place]) 09/20/2021   Lymphedema 09/17/2021   Ischemic leg pain (Bilateral) 08/19/2021   Paroxysmal atrial fibrillation (HCC)    History of atrial fibrillation 04/08/2021   PVD (peripheral vascular disease) ([place]) 04/08/2021   Low blood pressure reading 04/07/2021   Physical tolerance to opiate drug 01/28/2021   Chronic use of opiate for therapeutic purpose 11/24/2020   Atherosclerosis of native arteries of the extremities with ulceration ([place]) 41/28/7867   Uncomplicated opioid dependence ([place]) 10/20/2020   Encounter for chronic pain management 10/02/2020   Encounter for pain management counseling 10/02/2020   Chronic kidney disease 10/02/2020   Chronic sacroiliac pain (Bilateral) 09/30/2020   Osteoarthritis of sacroiliac joints (Bilateral) ([place]) 09/30/2020   Chronic midline low back pain without sciatica 09/30/2020   Acute on chronic respiratory failure with hypoxia ([place]) 08/02/2020   Prolonged QT interval 08/02/2020   Venous insufficiency of both lower extremities 07/04/2020   Varicose veins of left lower extremity with inflammation, with ulcer of ankle limited to breakdown of skin ([place]) 04/01/2020   Opioid dependence with opioid-induced disorder ([place]) 02/14/2020   Abnormal MRI, lumbar spine  (11/03/2019) 01/23/2020   Pharmacologic therapy 12/20/2019   Other specified dorsopathies, sacral and sacrococcygeal region 06/12/2019   Chronic hip pain (Left) 06/12/2019   Osteoarthritis of hip (Left) 06/12/2019   Greater trochanteric bursitis of hip (Left) 03/12/2019   Preop testing 01/22/2019   Cervicalgia 10/03/2018   Chronic shoulders pain (Bilateral) (L>R) 05/29/2018   Osteoarthritis of shoulder (Bilateral) 05/29/2018   Osteoarthritis of hip (Bilateral) 05/29/2018   Chronic shoulder pain after shoulder replacement (Bilateral) (L>R) 05/29/2018   Trigger finger, left middle finger 05/29/2018   Spondylosis without myelopathy or radiculopathy, lumbosacral region 02/09/2018   Insomnia 02/01/2018   Numbness and tingling of upper extremity (Bilateral) 09/07/2017   Chronic shoulder pain, Radicular (Bilateral) (L>R) 09/07/2017   Cervical facet syndrome (Bilateral) (L>R) 09/07/2017   Cervical (3-4 mm) Anterolisthesis of C4 on C5 09/07/2017   Cervical foraminal stenosis (Bilateral) 09/07/2017   Acute renal failure superimposed on stage 3a chronic kidney disease ([place]) 08/14/2017   Chronic lower extremity pain (2ry area of Pain) (Bilateral) (L>R) 05/13/2017   DDD (degenerative disc disease), cervical 05/13/2017   Chronic cervical radicular pain (3ry area of Pain) (Bilateral) (L>R) 05/04/2017   Chronic anticoagulation (Eliquis) 05/04/2017   Chronic hip pain (Bilateral) 04/20/2017   Chronic neck pain 03/01/2017   Hyperlipidemia 02/02/2017   Red blood cell antibody positive 11/18/2016   Anticoagulated 11/17/2016   Atrial fibrillation and flutter ([place]) 11/17/2016   Osteoarthritis involving  multiple joints 06/14/2016   Cardiac pacemaker in situ 01/07/2016   S/P cardiac pacemaker procedure 01/07/2016   GERD (gastroesophageal reflux disease) 12/18/2015   Chronic pain syndrome 06/25/2015   Chronic low back pain (1ry area of Pain) (Bilateral) (L>R) w/ sciatica (Bilateral) 06/25/2015   Lumbar  facet syndrome (Bilateral) (L>R) 06/25/2015   Lumbar spondylosis 06/25/2015   Chronic lumbar radicular pain (Bilateral) (L>R) 06/25/2015   Restless leg 06/25/2015   Myofascial pain 06/25/2015   Neurogenic pain 06/25/2015   Long term current use of opiate analgesic 06/25/2015   Long term prescription opiate use 06/25/2015   Opiate use (90 MME/Day) 06/25/2015   Opioid-induced constipation (OIC) 06/25/2015   Vitamin D insufficiency 06/25/2015   Grade 1 Retrolisthesis of L1 over L2 06/25/2015   Lumbar facet hypertrophy (L1-2, L2-3, and L4-5) 06/25/2015   Lumbar lateral recess stenosis (L1-2 and L4-5) 06/25/2015   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) 06/25/2015   Epidural fibrosis 06/25/2015   Lumbar foraminal stenosis (Severe Left L4-5) 06/25/2015   Osteoporosis 06/25/2015   Chronic sacroiliac joint pain (Left) 06/25/2015   History of methicillin resistant staphylococcus aureus (MRSA) 06/25/2015   Mitral valve prolapse 06/25/2015   Seasonal allergies 06/17/2015   History of breast cancer 06/17/2015   Chronic diastolic CHF (congestive heart failure) ([place]) 12/26/2014   Centrilobular emphysema ([place]) 09/16/2014   S/P cardiac catheterization 04/04/2014   DDD (degenerative disc disease), lumbosacral 04/03/2014   Benign hypertension with CKD (chronic kidney disease) stage III ([place]) 04/03/2014   Lumbar central spinal stenosis 04/03/2014   Chronic kidney disease (CKD), stage III (moderate) ([place]) 10/30/2013   Gout 10/30/2013   PCP:  [name], MD Pharmacy:   [place], [place] - [address] [place] [address] Phone: [phone] Fax: [phone]  [id number] [place] [id number]: [phone]: [phone]    Social Determinants of Health (SDOH) Interventions    Readmission Risk Interventions    01/22/2022   12:27 PM  11/06/2021    4:09 PM 09/21/2021   11:49 AM  Readmission Risk Prevention Plan  Transportation Screening Complete Complete Complete  PCP or Specialist Appt within 3-5 Days   Complete  [place] or [place]  Complete  Social Work Consult for [place]/Counseling   Complete  Palliative Care Screening   Not Applicable  Medication Review (Press photographer Complete Complete Complete  PCP or Specialist appointment within 3-5 days of discharge Complete Complete   HRI or Home Care Consult  Complete   SW Recovery Care/Counseling Consult Not Complete Not Complete   SW Consult Not Complete Comments NA- palliative to meet with patient and family RNCM assigned to case   Palliative Care Screening Complete Not [place] Not Applicable

## 2022-01-22 NOTE — Consult Note (Cosign Needed)
Palliative Care Consult Note                                  Date: 01/22/2022   Patient Name: Kristin [name]  DOB: 1929/11/23  MRN: [id number]  Age / Sex: 88 y.o., female  PCP: [name], MD Referring Physician: Ottie Glazier, MD  Reason for Consultation: Establishing goals of care  HPI/Patient Profile: 88 y.o. female  with past medical history of A-fib on Eliquis, right breast cancer 2004, HFpEF, COPD, hepatitis C, NSTEMI in 2022, SCC lung stage II status post palliative radiation. She was admitted on 01/16/2022 with acute hypoxic/hypercapnic respiratory failure secondary to suspected aspiration pneumonia  Follow-up Hematochezia due to PE exacerbation, paroxysmal A-fib, AKI on CKD, acute encephalopathy due to unknown etiology.  Since admission patient was extubated yesterday but appeared this morning to be struggling the hospital later.  Stat CT head without contrast essentially normal without sign of brain mets.  Recommend consideration of MRI and EEG.  Patient previously patient worked where she expressed desire for full scope of care, full code in order to "live as long as I can, no matter what it takes.  PMT was consulted for [place] conversations.  Past Medical History:  Diagnosis Date   Acute postoperative pain 02/09/2018   Arrhythmia, sinus node 04/03/2014   Arthritis    Arthritis    Atrial fibrillation (HCC)    Back pain    lower back chronic   Bradycardia    Breast cancer ([place]) 2004   right breast cancer   Cancer ([place]) 2004   rt breast cancer-post lumpectomy- chemo/rad   CHF (congestive heart failure) (HCC)    Chronic back pain    CKD (chronic kidney disease)    COPD (chronic obstructive pulmonary disease) ([place])    wears O2 at 2L via [place] at night   COVID-19 06/11/2020   Cystocele    Decubitus ulcers    Dehydration    Gout    Hematuria    Hepatitis C    Hyperlipidemia    Hypertension    Hypomagnesemia 06/25/2015    NSTEMI (non-ST elevated myocardial infarction) ([place]) 04/08/2021   OAB (overactive bladder)    Overactive bladder    Overdose opiate, accidental or unintentional, sequela 10/02/2020   Restless leg    Shoulder pain, bilateral    Urinary frequency    UTI (lower urinary tract infection)    Vaginal atrophy     Subjective:   This NP Walden Field reviewed medical records, received report from team, assessed the patient and then meet at the patient's bedside to discuss diagnosis, prognosis, GOC, EOL wishes disposition and options.  I met with the patient at the bedside who is unable to meaningfully interact. Her granddaughter/HCPOA [name] and her other granddaughter Maudie Mercury were both at the bedside.   Concept of Palliative Care was introduced as specialized medical care for people and their families living with serious illness.  If focuses on providing relief from the symptoms and stress of a serious illness.  The goal is to improve quality of life for both the patient and the family. Values and goals of care important to patient and family were attempted to be elicited.  Created space and opportunity for patient  and family to explore thoughts and feelings regarding current medical situation   Natural trajectory and current clinical status were discussed. Questions and concerns addressed. Patient  encouraged to call  with questions or concerns.    Patient/Family Understanding of Illness: They understand her breathing is tenuous but improved this afternoon compared to this morning. They understand she has not had any nutrition in several days and swallow eval is ordered. At baseline she is 'fiercely independent" and cooks for her self, pays her bills, and is fully independent with all ADLs; was driving until about a year ago. During the holidays last year she cooked for the whole family.  We had further conversation about her acute and chronic illnesses, options moving forward, and what these may mean for her  length of life and quality of life.  Life Review: She is very independent at 88 years old. Described as "stubborn" and wants to do things her way. She likes watching the "[employer]" and previously enjoyed doing puzzles, but less so lately. 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 She has two children: her daughter previously passed away and she essentially raised her 2 grandchildren from her daughter.  She also has a son [name], 3 grandkids, 12 great-grandchildren.  Her family describes that at some point she has babysat all of her kids.  [name] shares that she watched her newborn for the first year of life while her granddaughter went back to work.  Patient Values: Fierce independence, family  Goals: "To live at all costs"; short term goal: get better, get ome strength back, and get home eventually and back to leading her life.  Today's Discussion: We discussed the patient's independence, its impact on her goals of care, previous palliative visits.  They share that her goal is to continue living at all costs.  Even if this includes long-term care, LTAC, trach, vent, PEG tube, etc.  They emphasized that she has been very adamant about this to "do it all" to keep her living.  I took time to explain what this would mean for her life and her quality of life.  They fully understand.  Her 1 daughter is a Designer, jewellery and is quite versed in healthcare issues.  I have made it clear that her wishes, which they are carrying out, would be to continue full scope of care and full code.  I provided emotional and general support through therapeutic listening, empathy, sharing of stories, and other techniques.  Answered her questions and addressed her concerns to the best of my ability.  Limited ROS due to mental status and lethargy Review of Systems  Musculoskeletal:  Positive  for back pain (chronic, per family).   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  Constitutional:      General: She is sleeping. She is not in acute distress.    Appearance: She is ill-appearing.  HENT:     Head: Normocephalic and atraumatic.  Cardiovascular:     Rate and Rhythm: Normal rate.  Pulmonary:     Effort: Pulmonary effort is normal. No respiratory distress.     Breath sounds: No wheezing or rhonchi.  Abdominal:     General: Abdomen is flat. Bowel sounds are normal.     Palpations: Abdomen is soft.  Skin:  General: Skin is warm and dry.  Neurological:     Mental Status: She is easily aroused. She is lethargic.    Vital Signs:  BP (!) 131/53   Pulse 77   Temp 99.1 F (37.3 C) (Axillary)   Resp (!) 23   Ht 5' 7.01" (1.702 m)   Wt 77.2 kg   SpO2 90%   BMI 26.65 kg/m   Palliative Assessment/Data: 10-20% (not eating pending swallow eval)    Advanced Care Planning:   Primary Decision Maker: NEXT OF KIN  Code Status/Advance Care Planning: Full code  A discussion was had today regarding advanced directives. Concepts specific to code status, artifical feeding and hydration, continued IV antibiotics and rehospitalization was had.  The difference between a aggressive medical intervention path and a palliative comfort care path for this patient at this time was had. Expressed wishes for full scope of care.  Decisions/Changes to ACP: None today  Assessment & Plan:   Impression: 88 year old female with a host of chronic conditions including stage II small cell carcinoma of the lung status post palliative radiation (not a candidate for  chemotherapy).  She is admitted with respiratory failure, encephalopathy of unknown etiology (CT negative for metastasis, considering MRI and/or EEG).  She was just extubated yesterday and struggled a bit this morning with her respiratory status which seems to be improved.  Chest she (on previous visits) and her family currently are adamant about full scope of care, full code.  At this point expressed taking it a day at a time, working towards any available improvement.  Patient's family understands this may result in the patient being in the long-term care for some time, if not indefinitely.  Overall prognosis poor.  SUMMARY OF RECOMMENDATIONS   Continue full code Full scope of care PMT will check in early next week for progress Further [place] discussions pending evolution of clinical status  Symptom Management:  Per primary team PMT is available to assist as needed  Prognosis:  Unable to determine  Discharge Planning:  To Be Determined   Discussed with: Medical team, nursing team, patient's family    Thank you for allowing [name] to participate in the care of MARCIE SHEARON PMT will continue to support holistically.  Time Total: 95 min  Greater than 50%  of this time was spent counseling and coordinating care related to the above assessment and plan.  Signed by: Walden Field, NP Palliative Medicine Team  Team Phone # [phone] (Nights/Weekends)  01/22/2022, 2:48 PM

## 2022-01-22 NOTE — Progress Notes (Addendum)
Kristin [name]   DOB:1929/12/24   HM#:[id number]    Subjective: Patient s/p extubation.  Continues to be on oxygen.  Off pressors.Clinically improved-currently getting breathing treatment.     Objective:  Vitals:   01/22/22 0815 01/22/22 0900  BP: (!) 143/115 110/74  Pulse: 80 72  Resp: 19 (!) 23  Temp:    SpO2: 100% 100%     Intake/Output Summary (Last 24 hours) at 01/22/2022 0959 Last data filed at 01/22/2022 0540 Gross per 24 hour  Intake 1229.66 ml  Output 790 ml  Net 439.66 ml    Physical Exam Vitals and nursing note reviewed.  HENT:     Head: Normocephalic and atraumatic.  Cardiovascular:     Rate and Rhythm: Regular rhythm. Tachycardia present.  Pulmonary:     Comments: Decreased breath sounds bilaterally.  Coarse breath sounds Abdominal:     General: Abdomen is flat.     Palpations: Abdomen is soft.  Skin:    General: Skin is warm and dry.     Labs:  Lab Results  Component Value Date   WBC 7.0 01/22/2022   HGB 9.8 (L) 01/22/2022   HCT 31.8 (L) 01/22/2022   MCV 86.2 01/22/2022   PLT 129 (L) 01/22/2022   NEUTROABS 5.5 01/20/2022    Lab Results  Component Value Date   NA 137 01/22/2022   K 3.6 01/22/2022   CL 109 01/22/2022   CO2 25 01/22/2022    Studies:  No results found.  #88 year old female patient with multiple medical problems including chronic respiratory failure on home O2/COPD CHF; recent diagnosis small cell lung cancer is currently admitted to hospital for mental status changes/acute respiratory failure.   # Small cell lung cancer-limited stage however status post palliative radiation. Patient not a candidate for chemotherapy- over all poor prognosis.     # Acute on chronic respiratory failure-multifocal pneumonia-underlying COPD/CHF.  S/p Extubation [June 1st].   Currently on broad-spectrum antibiotics.    # Mental status changes-likely secondary to acute metabolic encephalopathy .  CT scan brain with and without contrast negative for any  metastasis.  Overall improved.  #Paroxysmal A-fib s/p PPM on IV heparin.   #Discussed with Dr.Alsekerov.  However overall patient's prognosis is guarded especially the context of her multiple medical problems/small cell lung cancer.   Cammie Sickle, MD 01/22/2022  9:59 AM

## 2022-01-22 NOTE — Progress Notes (Signed)
ANTICOAGULATION CONSULT NOTE  Pharmacy Consult for heparin infusion Indication: atrial fibrillation  Allergies  Allergen Reactions   Flexeril [Cyclobenzaprine Hcl]     Severe Rash   Vancomycin Rash    [place] Syndrome    Patient Measurements: Height: 5' 7.01" (170.2 cm) Weight: 77.2 kg (170 lb 3.1 oz) IBW/kg (Calculated) : 61.62 Heparin Dosing Weight: 75.8 kg  Vital Signs: Temp: 98.9 F (37.2 C) (06/02 0400) Temp Source: Axillary (06/02 0400) BP: 123/70 (06/02 0300) Pulse Rate: 62 (06/02 0300)  Labs: Recent Labs    01/19/22 1943 01/19/22 2241 01/20/22 0031 01/20/22 0106 01/20/22 0521 01/21/22 0448 01/22/22 0445  HGB  --   --   --  9.6*  9.7*  --  11.0* 9.8*  HCT  --   --   --  31.6*  32.1*  --  36.1 31.8*  PLT  --   --   --  158  154  --  162 129*  APTT 61*  --   --   --   --   --   --   HEPARINUNFRC 0.46  --   --   --  0.49 0.49 0.34  CREATININE  --   --   --   --  1.06* 1.10* 0.91  TROPONINIHS  --  33* 39*  --   --   --   --      Estimated Creatinine Clearance: 44 mL/min (by C-G formula based on SCr of 0.91 mg/dL).   Medical History: Past Medical History:  Diagnosis Date   Acute postoperative pain 02/09/2018   Arrhythmia, sinus node 04/03/2014   Arthritis    Arthritis    Atrial fibrillation (HCC)    Back pain    lower back chronic   Bradycardia    Breast cancer ([place]) 2004   right breast cancer   Cancer ([place]) 2004   rt breast cancer-post lumpectomy- chemo/rad   CHF (congestive heart failure) (HCC)    Chronic back pain    CKD (chronic kidney disease)    COPD (chronic obstructive pulmonary disease) ([place])    wears O2 at 2L via [place] at night   COVID-19 06/11/2020   Cystocele    Decubitus ulcers    Dehydration    Gout    Hematuria    Hepatitis C    Hyperlipidemia    Hypertension    Hypomagnesemia 06/25/2015   NSTEMI (non-ST elevated myocardial infarction) ([place]) 04/08/2021   OAB (overactive bladder)    Overactive bladder    Overdose opiate,  accidental or unintentional, sequela 10/02/2020   Restless leg    Shoulder pain, bilateral    Urinary frequency    UTI (lower urinary tract infection)    Vaginal atrophy   Heparin Dosing Weight: 75.8 kg  Medications:  PTA Med: Apixaban 2.5 mg BID last dose 5/27 (AM) Pt given Lovenox 75 mg 5/29 @ 1500  Assessment: Pt is 88 yo female being transitioned from therapeutic Lovenox to Heparin for A. Fib.  Date Time aPTT/HL Rate/Comment 5/30 0030 58s / 0.81 Labs prior to UFH gtt; (last dose of lovenox 75mg  5/29 1500) 5/30 1945 61s / 0.46 Therapeutic HL; correlation noted. 1100 un/hr     Baseline Labs: aPTT - unk INR - 1.7 Hgb - 12>10.3 Plts - 180>173  Goal of Therapy:  Heparin level 0.3-0.7 units/ml aPTT 66-102 seconds Monitor platelets by anticoagulation protocol: Yes   Plan:  Heparin level therapeutic 0.34. Continue heparin infusion at 1100 units/hr. HL daily w/  AM labs while therapeutic on heparin. CBC daily while on heparin.  Renda Rolls, PharmD, [facility] 01/22/2022 6:07 AM

## 2022-01-23 DIAGNOSIS — L8990 Pressure ulcer of unspecified site, unspecified stage: Secondary | ICD-10-CM | POA: Insufficient documentation

## 2022-01-23 LAB — BASIC METABOLIC PANEL
Anion gap: 6 (ref 5–15)
BUN: 18 mg/dL (ref 8–23)
CO2: 20 mmol/L — ABNORMAL LOW (ref 22–32)
Calcium: 9.5 mg/dL (ref 8.9–10.3)
Chloride: 109 mmol/L (ref 98–111)
Creatinine, Ser: 0.88 mg/dL (ref 0.44–1.00)
GFR, Estimated: >60 mL/min (ref >60)
Glucose, Bld: 86 mg/dL (ref 70–99)
Potassium: 3.8 mmol/L (ref 3.5–5.1)
Sodium: 135 mmol/L (ref 135–145)

## 2022-01-23 LAB — GLUCOSE, CAPILLARY
Glucose-Capillary: 85 mg/dL (ref 70–99)
Glucose-Capillary: 82 mg/dL (ref 70–99)
Glucose-Capillary: 83 mg/dL (ref 70–99)
Glucose-Capillary: 81 mg/dL (ref 70–99)
Glucose-Capillary: 71 mg/dL (ref 70–99)
Glucose-Capillary: 85 mg/dL (ref 70–99)
Glucose-Capillary: 82 mg/dL (ref 70–99)

## 2022-01-23 LAB — CBC
HCT: 29.3 % — ABNORMAL LOW (ref 36.0–46.0)
Hemoglobin: 9.2 g/dL — ABNORMAL LOW (ref 12.0–15.0)
MCH: 26.7 pg (ref 26.0–34.0)
MCHC: 31.4 g/dL (ref 30.0–36.0)
MCV: 85.2 fL (ref 80.0–100.0)
Platelets: 132 10*3/uL — ABNORMAL LOW (ref 150–400)
RBC: 3.44 MIL/uL — ABNORMAL LOW (ref 3.87–5.11)
RDW: 18.6 % — ABNORMAL HIGH (ref 11.5–15.5)
WBC: 6.8 10*3/uL (ref 4.0–10.5)
nRBC: 0 % (ref 0.0–0.2)

## 2022-01-23 LAB — HEPARIN LEVEL (UNFRACTIONATED): Heparin Unfractionated: 0.29 IU/mL — ABNORMAL LOW (ref 0.30–0.70)

## 2022-01-23 LAB — CULTURE, BLOOD (ROUTINE X 2)
Culture: NO GROWTH
Special Requests: ADEQUATE

## 2022-01-23 LAB — PHOSPHORUS: Phosphorus: 2.5 mg/dL (ref 2.5–4.6)

## 2022-01-23 LAB — MAGNESIUM: Magnesium: 2 mg/dL (ref 1.7–2.4)

## 2022-01-23 MED ORDER — ROPINIROLE HCL 1 MG PO TABS
2.0000 mg | ORAL_TABLET | Freq: Every day | ORAL | Status: AC
  Administered 2022-01-23: 2 mg via ORAL
  Filled 2022-01-23: qty 2

## 2022-01-23 MED ORDER — TRAVASOL 10 % IV SOLN
INTRAVENOUS | Status: AC
  Filled 2022-01-23: qty 388.8

## 2022-01-23 MED ORDER — ROPINIROLE HCL 1 MG PO TABS: 4.0000 mg | ORAL_TABLET | Freq: Every day | ORAL | Status: DC

## 2022-01-23 MED ORDER — OXYCODONE HCL 5 MG PO TABS
5.0000 mg | ORAL_TABLET | Freq: Four times a day (QID) | ORAL | Status: DC | PRN
  Administered 2022-01-23 – 2022-01-28 (×12): 5 mg via ORAL
  Filled 2022-01-23 (×13): qty 1

## 2022-01-23 MED ORDER — TRAVASOL 10 % IV SOLN
INTRAVENOUS | Status: DC
  Filled 2022-01-23 (×4): qty 388.8

## 2022-01-23 MED ORDER — ASPIRIN 81 MG PO CHEW
81.0000 mg | CHEWABLE_TABLET | Freq: Every day | ORAL | Status: DC
  Administered 2022-01-24 – 2022-01-28 (×5): 81 mg via ORAL
  Filled 2022-01-23 (×5): qty 1

## 2022-01-23 MED ORDER — INSULIN ASPART 100 UNIT/ML IJ SOLN
0.0000 [IU] | Freq: Three times a day (TID) | INTRAMUSCULAR | Status: DC
  Administered 2022-01-24 – 2022-01-28 (×7): 1 [IU] via SUBCUTANEOUS
  Administered 2022-01-29: 2 [IU] via SUBCUTANEOUS
  Administered 2022-01-29 – 2022-01-30 (×3): 1 [IU] via SUBCUTANEOUS
  Filled 2022-01-23 (×12): qty 1

## 2022-01-23 MED ORDER — APIXABAN 2.5 MG PO TABS
2.5000 mg | ORAL_TABLET | Freq: Two times a day (BID) | ORAL | Status: DC
  Administered 2022-01-23 – 2022-01-28 (×12): 2.5 mg via ORAL
  Filled 2022-01-23 (×12): qty 1

## 2022-01-23 MED ORDER — ROPINIROLE HCL 1 MG PO TABS
2.0000 mg | ORAL_TABLET | ORAL | Status: AC
  Administered 2022-01-23: 2 mg via ORAL
  Filled 2022-01-23: qty 2

## 2022-01-23 MED ORDER — MELATONIN 5 MG PO TABS
10.0000 mg | ORAL_TABLET | Freq: Every day | ORAL | Status: DC
  Administered 2022-01-23 – 2022-01-28 (×6): 10 mg via ORAL
  Filled 2022-01-23 (×6): qty 2

## 2022-01-23 MED ORDER — ROPINIROLE HCL 1 MG PO TABS
4.0000 mg | ORAL_TABLET | Freq: Every day | ORAL | Status: DC
  Administered 2022-01-24 – 2022-01-28 (×5): 4 mg via ORAL
  Filled 2022-01-23 (×5): qty 4

## 2022-01-23 NOTE — Consult Note (Signed)
PHARMACY - TOTAL PARENTERAL NUTRITION CONSULT NOTE   Indication:  Prolonged NPO and inability to place enteral access for nutrition  Patient Measurements: Height: 5' 7.01" (170.2 cm) Weight: 77.8 kg (171 lb 8.3 oz) IBW/kg (Calculated) : 61.62   Body mass index is 26.86 kg/m.  Assessment:  Patient is a 88 y/o F with medical history including Afib on apixaban, breast cancer in 2004, HFpEF, COPD, gout, hepatitis C, HLD, HTN, NSTEMI, lung cancer who is admitted 5/27 with acute respiratory failure in setting of aspiration / acute encephalopathy. Patient was intubated 5/28 thru 6/1. SLP consulted and is recommending NPO except medications. Attempts to place NG tube at bedside have been unsuccessful. Further options for enteral access are being discussed. In the meantime, pharmacy has been consulted to initiate TPN to provide nutrition.  Glucose / Insulin: Last Hgb A1c was 6.1% on 11/05/21. Patient has been normoglycemic. Electrolytes: No abnormalities at present Renal: Scr < 1 Hepatic: LFTs within normal limits Intake / Output; MIVF: I&O + 4L for the admission. MIVF with D10w at 20 cc/hr  GI Imaging: N/A GI Surgeries / Procedures: N/A  Central access: PICC placed 5/28 TPN start date: 6/3  Nutritional Goals: Goal TPN rate is 70 mL/hr (provides 90 g of protein and 1830 kcals per day)  RD Assessment: Estimated Needs Total Energy Estimated Needs: 1600-1800kcal/day Total Protein Estimated Needs: 80-90g/day Total Fluid Estimated Needs: 1.6-1.8L/day  Current Nutrition:  NPO  Plan:  --Start TPN at 30 mL/hr at 1800 --Electrolytes in TPN: Na 68mEq/L, K 27mEq/L, Ca 73mEq/L, Mg 44mEq/L, and Phos 62mmol/L. Cl:Ac 1:1 --Add standard MVI and trace elements to TPN, thiamine 100 mg x 3 days (day # 1 / 3) --Initiate Sensitive q8h SSI and adjust as needed  --Discontinue D10w at 1800 with initiation of TPN --Monitor TPN labs on Mon/Thurs, daily until stable  Benita Gutter 01/23/2022,10:34 AM

## 2022-01-23 NOTE — Progress Notes (Signed)
Pt VSS. Atrial paced. Tolerating 3L [place]. Tolerating meds crushed in applesauce and ice chips sparingly. Has a cough but not with PO. Report called to 1A RN and pt transported via floor bed.

## 2022-01-23 NOTE — Evaluation (Signed)
Physical Therapy Evaluation Patient Details Name: Kristin [name] MRN: [id number] DOB: September 14, 1929 Today's Date: 01/23/2022  History of Present Illness  88 year old female presenting to [facility] ED from home via EMS on 01/16/2022 with complaints of lethargy and weakness.  Per the patient's granddaughter bedside, Kristin [name], patient was in her normal state of health until earlier in the evening on 01/16/2022 when she picked up the phone but did not respond.  Patient's granddaughter lives next door and walked over to check on her finding her sitting at the kitchen table with her head on the table.  She was responsive and able to talk but very weak.  Only other thing family noticed was that the patient was sleeping more than usual.  No other complaints that family was aware of. PMHx significant for atrial fibrillation, COPD, HLD, HTN, and gout.  Clinical Impression  The pt presents this session with positive spirit. She is agreeable to PT/OT co-treat. The pt presents with hTN with initial vital check but following exercises in chair position has slight increase in BP and is able to progress to EOB mobility. The pt presents with some difficulty with speaking and some cognitive impairment, however the family present does not make a note of this. Prior to admission the pt was Mod I for mobility and currently is well below her baseline level of function. At this time would recommend SNF in order to optimize functional mobility.       Recommendations for follow up therapy are one component of a multi-disciplinary discharge planning process, led by the attending physician.  Recommendations may be updated based on patient status, additional functional criteria and insurance authorization.  Follow Up Recommendations Skilled nursing-short term rehab (<3 hours/day)    Assistance Recommended at Discharge    Patient can return home with the following  Two people to help with walking and/or transfers;Two people to help with  bathing/dressing/bathroom;Direct supervision/assist for financial management;Direct supervision/assist for medications management;Assist for transportation;Help with stairs or ramp for entrance;Assistance with feeding;Assistance with cooking/housework    Equipment Recommendations    Recommendations for Other Services       Functional Status Assessment Patient has had a recent decline in their functional status and/or demonstrates limited ability to make significant improvements in function in a reasonable and predictable amount of time     Precautions / Restrictions Precautions Precautions: Fall Restrictions Weight Bearing Restrictions: No      Mobility  Bed Mobility Overal bed mobility: Needs Assistance Bed Mobility: Supine to Sit, Sit to Supine     Supine to sit: Min assist, +2 for physical assistance, HOB elevated Sit to supine: Min assist, +2 for physical assistance        Transfers Overall transfer level: Needs assistance Equipment used: Rolling walker (2 wheels) Transfers: Sit to/from Stand Sit to Stand: Mod assist, +2 physical assistance, +2 safety/equipment           General transfer comment: Pt requiring hand over hand assistance for hand placement on the RW. Presenting with L knee buckling requiring block for safety.    Ambulation/Gait               General Gait Details: unsafe to trial ambulation at this time.  Stairs            Wheelchair Mobility    Modified Rankin (Stroke Patients Only)       Balance Overall balance assessment: Needs assistance Sitting-balance support: Feet supported Sitting balance-Leahy Scale: Poor   Postural control: Left lateral  lean (LUE propped on pillows)   Standing balance-Leahy Scale: Zero                               Pertinent Vitals/Pain Pain Assessment Pain Assessment: Faces Facial Expression: Relaxed, neutral Body Movements: Absence of movements Muscle Tension: Tense, rigid Pain  Intervention(s): Monitored during session    Home Living Family/patient expects to be discharged to:: Private residence Living Arrangements: Children Available Help at Discharge: Family;Available PRN/intermittently Type of Home: House Home Access: Level entry       Home Layout: Able to live on main level with bedroom/bathroom;One level;Laundry or work area in [place]: Marine scientist - single point;Grab bars - toilet;Grab bars - tub/shower;Rollator (4 wheels);Wheelchair - manual      Prior Function Prior Level of Function : Independent/Modified Independent             Mobility Comments: ambulatory with SPC ADLs Comments: MOD I- I with all ADL, assist with IADL; son does shopping, drives, cleans; pt reports she cooks     Hand Dominance   Dominant Hand: Right    Extremity/Trunk Assessment   Upper Extremity Assessment Upper Extremity Assessment: RUE deficits/detail;LUE deficits/detail RUE: Shoulder pain with ROM LUE Deficits / Details: Limited ROM but no pain complaints with mobility.    Lower Extremity Assessment Lower Extremity Assessment: RLE deficits/detail;LLE deficits/detail RLE Deficits / Details: 2/5 gross strength. Pt unable to move within full ROM in gravity dependent position. RLE Sensation: WNL RLE Coordination: decreased gross motor LLE Deficits / Details: 2/5 gross strength. Pt unable to move within full ROM in gravity dependent position. LLE Sensation: WNL    Cervical / Trunk Assessment Cervical / Trunk Assessment: Other exceptions Cervical / Trunk Exceptions: Cervical: L lateral flexion and rotation noted with significant tightness. Pt able to be stretched to neural head alignment but unable to maintain in neutral. Use of towel roll to prop head in more neutral alignment.  Communication   Communication: Expressive difficulties  Cognition Arousal/Alertness: Awake/alert Behavior During Therapy: WFL for tasks assessed/performed Overall  Cognitive Status: Impaired/Different from baseline Area of Impairment: Orientation, Memory, Attention                 Orientation Level: Person, Place             General Comments: Family present during the session, no reports of cognition being outside of baseline.        General Comments      Exercises     Assessment/Plan    PT Assessment Patient needs continued PT services  PT Problem List Decreased strength;Decreased mobility;Decreased activity tolerance;Decreased balance;Decreased range of motion;Impaired tone       PT Treatment Interventions Gait training;Stair training;Therapeutic exercise;Balance training;Functional mobility training;Patient/family education;Therapeutic activities    PT Goals (Current goals can be found in the Care Plan section)  Acute Rehab PT Goals Patient Stated Goal: get stronger PT Goal Formulation: With patient Time For Goal Achievement: 02/06/22 Potential to Achieve Goals: Fair    Frequency Min 2X/week     Co-evaluation PT/OT/SLP Co-Evaluation/Treatment: Yes Reason for Co-Treatment: Complexity of the patient's impairments (multi-system involvement);To address functional/ADL transfers PT goals addressed during session: Mobility/safety with mobility;Balance OT goals addressed during session: ADL's and self-care;Strengthening/ROM       AM-PAC PT "6 Clicks" Mobility  Outcome Measure Help needed turning from your back to your side while in a flat bed without using bedrails?: A Lot Help  needed moving from lying on your back to sitting on the side of a flat bed without using bedrails?: A Lot Help needed moving to and from a bed to a chair (including a wheelchair)?: Total Help needed standing up from a chair using your arms (e.g., wheelchair or bedside chair)?: Total Help needed to walk in hospital room?: Total Help needed climbing 3-5 steps with a railing? : Total 6 Click Score: 8    End of Session Equipment Utilized During  Treatment: Oxygen Activity Tolerance: Patient tolerated treatment well Patient left: in bed;with family/visitor present;with call bell/phone within reach;with bed alarm set Nurse Communication: Mobility status PT Visit Diagnosis: Unsteadiness on feet (R26.81);Muscle weakness (generalized) (M62.81);Difficulty in walking, not elsewhere classified (R26.2)    Time: 1353-1430 PT Time Calculation (min) (ACUTE ONLY): 37 min   Charges:   PT Evaluation $PT Eval Moderate Complexity: 1 Mod PT Treatments $Therapeutic Activity: 8-22 mins        3:22 PM, 01/23/22 Nava Song A. Saverio Danker PT, DPT Physical Therapist - [facility]   Azure Budnick A Garfield Coiner 01/23/2022, 3:18 PM

## 2022-01-23 NOTE — Progress Notes (Signed)
NAME:  Kristin [name], MRN:  [id number], DOB:  [birth year], LOS: 6 ADMISSION DATE:  01/16/2022, CONSULTATION DATE: 01/17/2022 REFERRING MD: Dr. Damita Dunnings, CHIEF COMPLAINT: Weakness  History of Present Illness:  88 year old female presenting to [facility] ED from home via EMS on 01/16/2022 with complaints of lethargy and weakness.  Per the patient's granddaughter bedside, [name], patient was in her normal state of health until earlier in the evening on 01/16/2022 when she picked up the phone but did not respond.  Patient's granddaughter lives next door and walked over to check on her finding her sitting at the kitchen table with her head on the table.  She was responsive and able to talk but very weak.  Only other thing family noticed was that the patient was sleeping more than usual.  No other complaints that family was aware of.  01/18/22- patient was unable to get NGT advaced by RN staff after multiple attempts. We will try again with direct visual utilzing GLideScope.  Patient is making attempts at opening eyes and having spontaneous non jerky movements. Family in room we met and reivewed medical plan.    01/19/22- patient on SBT today, mentation still lethargic.  Weaning centrally acting meds for mental status. Renal function markedly improved. Reviewed plan with oncologist plan for repeat neuroimaging with CTA head  01/20/22- patient had prolonged SBT today. She remains with aggitation and but able to open eyes and was taking better breaths while on weaning trial to extubate.  After as short while she developed wheezing bilaterally and appeard to have labored breathing.  Family was at bedside and we agreed to let her rest tonight and try again tommorow for extubation.    01/21/22- patient extubated with family at bedside. She is communicative with encephalopathy. ABG post extubation ordered.   01/22/22- patient seems to be struggling off ventilator and is with overall poor prognosis. She would benefit from  palliative discussion. Palliative care evaluation - patient wants fulll code  01/23/22- patient had SLP today, she passed with restrictions. She is communicative and is much imrpoved clinically.  She remains with poor prognosis due to advanced age and lung cancer but overall doing well in short term.   Pertinent  Medical History  Atrial fibrillation on Eliquis Right breast cancer 2004 HFpEF COPD COVID-19 06/11/2020 Gout Hepatitis C Hyperlipidemia Hypertension NSTEMI 04/08/2021 SCC lung status post palliative radiation SBRT x5 fractions PPM in place Significant Hospital Events: Including procedures, antibiotic start and stop dates in addition to other pertinent events   01/17/2022: Admit to ICU with acute hypoxic respiratory failure secondary to suspected aspiration in the setting of acute encephalopathy    Objective   Blood pressure (!) 103/53, pulse 67, temperature (!) 97.2 F (36.2 C), temperature source Axillary, resp. rate (!) 22, height 5' 7.01" (1.702 m), weight 77.8 kg, SpO2 96 %.        Intake/Output Summary (Last 24 hours) at 01/23/2022 1257 Last data filed at 01/23/2022 1235 Gross per 24 hour  Intake 883.38 ml  Output 910 ml  Net -26.62 ml    Filed Weights   01/21/22 0459 01/22/22 0500 01/23/22 0600  Weight: 79.4 kg 77.2 kg 77.8 kg    Examination: General: Adult female, critically ill, lying in bed, NAD HEENT: MM pink/moist, anicteric, atraumatic, neck supple Neuro: no FND clinically , GCS 12 CV: s1s2 RRR, NSR paced rhythm on monitor, no r/m/g Pulm: Regular, non labored on NRB 15 L, breath sounds coarse crackles-BUL & diminished-BLL GI: soft, rounded, non  tender, bs x 4 GU: foley in place with clear yellow urine Skin: Limited exam- no rashes/lesions noted Extremities: warm/dry, pulses + 2 R/P, +2 edema noted BLE  Resolved Hospital Problem list     Assessment & Plan:    Acute Hypoxic / Hypercapnic Respiratory Failure  -secondary to suspected aspiration in  the setting of possible CAP & acute HFpEF exacerbation  -improved on nasal canula 3l now doing well  2 Chronic HFpEF exacerbation -had mild Elevated Troponin secondary to suspected demand ischemia Chronic / Paroxysmal Atrial Fibrillation - Continuous cardiac monitoring  - Daily weights to assess volume status - Diurese with the use of IV lasix, as hemodynamics and renal function permit -Continue outpatient Eliquis  Acute Kidney Injury superimposed on CKD stage 3b  Baseline Cr: 1.18, Cr on admission:1.50 - Strict I/O's: alert provider if UOP < 0.5 mL/kg/hr - Daily BMP, replace electrolytes PRN - Avoid nephrotoxic agents as able, ensure adequate renal perfusion  Acute encephalopathy secondary to unknown etiology- resolved  -Stat CT head without contrast - f/u ammonia level -Consider EEG/MRI brain if mentation does not improve with oxygenation support -Neurochecks every 4 -Avoid sedating medications as able - supportive care  Best Practice (right click and "Reselect all SmartList Selections" daily)  Diet/type: NPO w/ meds via tube DVT prophylaxis: DOAC GI prophylaxis: PPI Lines: N/A Foley:  Yes, and it is still needed Code Status:  full code Last date of multidisciplinary goals of care discussion [01/16/22] Updated granddaughter, HCPOA, via telephone.  Questions and concerns answered at this time.  Labs   CBC: Recent Labs  Lab 01/16/22 2342 01/17/22 0549 01/19/22 0904 01/20/22 0106 01/21/22 0448 01/22/22 0445 01/23/22 0336  WBC 4.4   < > 7.3 7.2  7.1 10.5 7.0 6.8  NEUTROABS 3.0  --   --  5.5  --   --   --   HGB 12.0   < > 10.3* 9.6*  9.7* 11.0* 9.8* 9.2*  HCT 40.5   < > 33.6* 31.6*  32.1* 36.1 31.8* 29.3*  MCV 90.4   < > 88.4 88.5  88.4 88.0 86.2 85.2  PLT 180   < > 173 158  154 162 129* 132*   < > = values in this interval not displayed.     Basic Metabolic Panel: Recent Labs  Lab 01/19/22 0904 01/20/22 0521 01/21/22 0448 01/22/22 0445 01/23/22 0336   NA 138 139 137 137 135  K 3.4* 3.2* 3.8 3.6 3.8  CL 108 108 109 109 109  CO2 '24 24 25 25 ' 20*  GLUCOSE 110* 128* 151* 117* 86  BUN '17 17 19 19 18  ' CREATININE 1.02* 1.06* 1.10* 0.91 0.88  CALCIUM 8.5* 8.5* 9.0 9.2 9.5  MG 2.0 1.9 2.0 2.0 2.0  PHOS 2.4* 2.8 2.8 2.5 2.5    GFR: Estimated Creatinine Clearance: 45.7 mL/min (by C-G formula based on SCr of 0.88 mg/dL). Recent Labs  Lab 01/16/22 2343 01/17/22 0549 01/17/22 2256 01/18/22 0357 01/19/22 0904 01/20/22 0031 01/20/22 0106 01/21/22 0448 01/22/22 0445 01/23/22 0336  PROCALCITON  --  0.21  --  1.07 1.19 1.35  --   --   --   --   WBC  --  6.7  --  8.7 7.3  --  7.2  7.1 10.5 7.0 6.8  LATICACIDVEN 1.5  --  0.9  --   --   --   --   --   --   --      Liver Function  Tests: Recent Labs  Lab 01/16/22 2342  AST 41  ALT 17  ALKPHOS 81  BILITOT 1.2  PROT 8.3*  ALBUMIN 3.3*    No results for input(s): LIPASE, AMYLASE in the last 168 hours. Recent Labs  Lab 01/17/22 0549  AMMONIA 46*     ABG    Component Value Date/Time   PHART 7.47 (H) 01/22/2022 1139   PCO2ART 31 (L) 01/22/2022 1139   PO2ART 53 (L) 01/22/2022 1139   HCO3 22.6 01/22/2022 1139   ACIDBASEDEF 0.2 01/22/2022 1139   O2SAT 87.5 01/22/2022 1139      Coagulation Profile: Recent Labs  Lab 01/19/22 0030  INR 1.7*     Cardiac Enzymes: No results for input(s): CKTOTAL, CKMB, CKMBINDEX, TROPONINI in the last 168 hours.  HbA1C: Hgb A1c MFr Bld  Date/Time Value Ref Range Status  11/05/2021 02:03 PM 6.1 (H) 4.8 - 5.6 % Final    Comment:    (NOTE)         Prediabetes: 5.7 - 6.4         Diabetes: >6.4         Glycemic control for adults with diabetes: <7.0   04/01/2020 08:07 AM 5.5 <5.7 % of total Hgb Final    Comment:    For the purpose of screening for the presence of diabetes: . <5.7%       Consistent with the absence of diabetes 5.7-6.4%    Consistent with increased risk for diabetes             (prediabetes) > or =6.5%  Consistent  with diabetes . This assay result is consistent with a decreased risk of diabetes. . Currently, no consensus exists regarding use of hemoglobin A1c for diagnosis of diabetes in children. . According to American Diabetes Association (ADA) guidelines, hemoglobin A1c <7.0% represents optimal control in non-pregnant diabetic patients. Different metrics may apply to specific patient populations.  Standards of Medical Care in Diabetes(ADA). .     CBG: Recent Labs  Lab 01/22/22 2316 01/23/22 0024 01/23/22 0324 01/23/22 0733 01/23/22 1143  GLUCAP 67* 85 82 83 81     Review of Systems:   UTA patient unresponsive unable to participate in interview  Past Medical History:  She,  has a past medical history of Acute postoperative pain (02/09/2018), Arrhythmia, sinus node (04/03/2014), Arthritis, Arthritis, Atrial fibrillation ([place]), Back pain, Bradycardia, Breast cancer ([place]) (2004), Cancer ([place]) (2004), CHF (congestive heart failure) ([place]), Chronic back pain, CKD (chronic kidney disease), COPD (chronic obstructive pulmonary disease) ([place]), COVID-19 (06/11/2020), Cystocele, Decubitus ulcers, Dehydration, Gout, Hematuria, Hepatitis C, Hyperlipidemia, Hypertension, Hypomagnesemia (06/25/2015), NSTEMI (non-ST elevated myocardial infarction) ([place]) (04/08/2021), OAB (overactive bladder), Overactive bladder, Overdose opiate, accidental or unintentional, sequela (10/02/2020), Restless leg, Shoulder pain, bilateral, Urinary frequency, UTI (lower urinary tract infection), and Vaginal atrophy.   Surgical History:   Past Surgical History:  Procedure Laterality Date   ABDOMINAL HYSTERECTOMY     BACK SURGERY     BREAST LUMPECTOMY Right 2004   BREAST SURGERY     rt lumpectomy   CARPAL TUNNEL RELEASE     CATARACT EXTRACTION W/PHACO  10/19/2011   Procedure: CATARACT EXTRACTION PHACO AND INTRAOCULAR LENS PLACEMENT (IOC);  Surgeon: Elta Guadeloupe T. Gershon Crane, MD;  Location: [facility];  Service: Ophthalmology;  Laterality:  Right;  CDE:10.81   CATARACT EXTRACTION W/PHACO  11/02/2011   Procedure: CATARACT EXTRACTION PHACO AND INTRAOCULAR LENS PLACEMENT (IOC);  Surgeon: Elta Guadeloupe T. Gershon Crane, MD;  Location: [facility];  Service: Ophthalmology;  Laterality:  Left;  CDE 8.60   CHOLECYSTECTOMY     3/18   COLON SURGERY     INSERT / REPLACE / REMOVE PACEMAKER     JOINT REPLACEMENT     bilateral TKA   LOWER EXTREMITY ANGIOGRAPHY Left 10/14/2020   Procedure: LOWER EXTREMITY ANGIOGRAPHY;  Surgeon: Katha Cabal, MD;  Location: [place] [facility];  Service: Cardiovascular;  Laterality: Left;   LOWER EXTREMITY ANGIOGRAPHY Right 09/08/2021   Procedure: LOWER EXTREMITY ANGIOGRAPHY;  Surgeon: Katha Cabal, MD;  Location: [place] [facility];  Service: Cardiovascular;  Laterality: Right;   PACEMAKER INSERTION N/A 12/24/2015   Procedure: INSERTION PACEMAKER;  Surgeon: Isaias Cowman, MD;  Location: [facility];  Service: Cardiovascular;  Laterality: N/A;   ROTATOR CUFF REPAIR     bilateral   VIDEO BRONCHOSCOPY WITH ENDOBRONCHIAL NAVIGATION Right 11/11/2021   Procedure: VIDEO BRONCHOSCOPY WITH ENDOBRONCHIAL NAVIGATION;  Surgeon: Ottie Glazier, MD;  Location: [facility];  Service: Thoracic;  Laterality: Right;     Social History:   reports that she quit smoking about 23 years ago. Her smoking use included cigarettes. She has a 50.00 pack-year smoking history. She has never used smokeless tobacco. She reports current drug use. Drug: Oxycodone. She reports that she does not drink alcohol.   Family History:  Her family history includes Breast cancer in her paternal aunt; Breast cancer (age of onset: 34) in her sister; Cancer in her sister; Heart disease in her father and mother; Kidney disease in her brother; Stroke in her mother. There is no history of Anesthesia problems, Hypotension, Malignant hyperthermia, or Pseudochol deficiency.   Allergies Allergies  Allergen Reactions   Flexeril [Cyclobenzaprine Hcl]     Severe Rash    Vancomycin Rash    [place] Syndrome     Home Medications  Prior to Admission medications   Medication Sig Start Date End Date Taking? Authorizing Provider  acetaminophen (TYLENOL) 500 MG tablet Take 500 mg by mouth in the morning and at bedtime.   Yes [provider]  apixaban (ELIQUIS) 2.5 MG TABS tablet Take 1 tablet (2.5 mg total) by mouth 2 (two) times daily. 04/09/21  Yes Wieting, [name], MD  atorvastatin (LIPITOR) 20 MG tablet TAKE 1 TABLET BY MOUTH ONCE DAILY FOR CHOLESTEROL Patient taking differently: Take 20 mg by mouth daily. 02/17/21  Yes Karamalegos, Alexander J, DO  BREZTRI AEROSPHERE 160-9-4.8 MCG/ACT AERO Inhale 2 puffs into the lungs 2 (two) times daily. 07/07/21  Yes Karamalegos, Devonne Doughty, DO  Cholecalciferol (VITAMIN D) 2000 units tablet Take 2,000 Units by mouth daily.    Yes [provider]  empagliflozin (JARDIANCE) 10 MG TABS tablet Take 1 tablet (10 mg total) by mouth daily before breakfast. 12/29/21  Yes [name] A, FNP  furosemide (LASIX) 40 MG tablet Take 1 tablet (40 mg total) by mouth daily. 09/23/21  Yes Nicole Kindred A, DO  gabapentin (NEURONTIN) 300 MG capsule Take 1 capsule (300 mg total) by mouth 3 (three) times daily. 11/12/21  Yes Annita Brod, MD  GNP ASPIRIN LOW DOSE 81 MG EC tablet TAKE 1 TABLET BY MOUTH ONCE DAILY. *SWALLOW WHOLE* 08/18/21  Yes Schnier, Dolores Lory, MD  levocetirizine (XYZAL) 5 MG tablet TAKE (1/2) TABLET BY MOUTH EVERY OTHER DAY. 06/19/21  Yes Karamalegos, Devonne Doughty, DO  Melatonin 10 MG TABS Take 10 mg by mouth at bedtime.   Yes [provider]  Multiple Vitamin (MULTIVITAMIN WITH MINERALS) TABS tablet Take 1 tablet by mouth daily. 11/13/21  Yes Annita Brod, MD  oxyCODONE (OXY IR/ROXICODONE) 5 MG immediate release tablet Take 2 tablets (10 mg total) by mouth 2 (two) times daily. May also take 1 tablet (5 mg total) at bedtime as needed for severe pain. Must last 30 days.. 12/23/21 01/22/22 Yes Milinda Pointer, MD  rOPINIRole (REQUIP) 2 MG tablet TAKE 2 TABLETS BY MOUTH AT BEDTIME 10/21/21  Yes Karamalegos, Alexander J, DO  albuterol (PROVENTIL) (2.5 MG/3ML) 0.083% nebulizer solution USE 1 VIAL IN NEBULIZER EVERY 8 HOURS AS NEEDED FOR WHEEZING OR SHORTNESS OF BREATH. 12/22/21   Karamalegos, Devonne Doughty, DO  bisacodyl (DULCOLAX) 5 MG EC tablet Take 2 tablets (10 mg total) by mouth daily as needed for moderate constipation. 07/04/20   Karamalegos, Devonne Doughty, DO  diclofenac Sodium (VOLTAREN) 1 % GEL Apply 4 g topically 2 (two) times daily as needed (As needed for pain). 10/15/21   Kris Hartmann, NP  feeding supplement (ENSURE ENLIVE / ENSURE PLUS) LIQD Take 237 mLs by mouth 2 (two) times daily between meals. 11/13/21   Annita Brod, MD  oxyCODONE (OXY IR/ROXICODONE) 5 MG immediate release tablet Take 2 tablets (10 mg total) by mouth 2 (two) times daily. May also take 1 tablet (5 mg total) at bedtime as needed for severe pain. Must last 30 days.. 01/22/22 02/21/22  Milinda Pointer, MD  oxyCODONE (OXY IR/ROXICODONE) 5 MG immediate release tablet Take 2 tablets (10 mg total) by mouth 2 (two) times daily. May also take 1 tablet (5 mg total) at bedtime as needed for severe pain. Must last 30 days.. 02/21/22 03/23/22  Milinda Pointer, MD  OXYGEN Inhale 2 L into the lungs daily.    [provider]    Critical care provider statement:   Total critical care time: 33 minutes   Performed by: Lanney Gins MD   Critical care time was exclusive of separately billable procedures and treating other patients.   Critical care was necessary to treat or prevent imminent or life-threatening deterioration.   Critical care was time spent personally by me on the following activities: development of treatment plan with patient and/or surrogate as well as nursing, discussions with consultants, evaluation of patient's response to treatment, examination of patient, obtaining history from patient or surrogate,  ordering and performing treatments and interventions, ordering and review of laboratory studies, ordering and review of radiographic studies, pulse oximetry and re-evaluation of patient's condition.    Ottie Glazier, M.D.  Pulmonary & Critical Care Medicine

## 2022-01-23 NOTE — Progress Notes (Signed)
Nutrition Follow-up  DOCUMENTATION CODES:   Non-severe (moderate) malnutrition in context of chronic illness  INTERVENTION:   TPN per pharmacy  Recommend thiamine 132m daily added to TPN x 3 days  Daily weights   If nasogastric tube placed, recommend:  Osmolite 1.5_0 /hr- Initiate at 223mhr and increase by 1057mr q8 hours until goal rate is reached.   Pro-Source 61m54mily via tube, provides 40kcal and 11g of protein per serving   Free water flushes 100ml63mhours   Regimen 1840kcal/day, 86g/day protein and 1514ml/30mof free water  Pt at high refeed risk; recommend monitor potassium, magnesium and phosphorus labs daily until stable  NUTRITION DIAGNOSIS:   Moderate Malnutrition related to chronic illness (COPD) as evidenced by mild fat depletion, moderate fat depletion, mild muscle depletion, moderate muscle depletion.  GOAL:   Patient will meet greater than or equal to 90% of their needs -not met   MONITOR:   Labs, Weight trends, Skin, I & O's, Other (Comment) (TPN)  ASSESSMENT:   89 y/o19emale with h/o breast cancer s/p lumpectomy/chemo/XRT, CHF, CKD III, Afib, COPD, lung cancer, pacemaker, OVD, DDD, gout, GERD, HTN and COVID 19 (2021) who is admitted with acute hypoxic / hypercapnic respiratory failure secondary to suspected aspiration in the setting of possible CAP & acute HFpEF exacerbation, AKI and encephalopathy.  Pt seen by SLP today and was unable to be placed on a diet. Nursing unable to place NGT. Plan today will be for PICC line and TPN. Will plan for fluoroscopy guided NGT next week. Pt is at high refeed risk. Per chart, pt up ~4lbs since admission; PT +3.9L on her I & Os. Pt is at high risk for volume overload; will monitor daily weights.   Medications reviewed and include: aspirin, melatonin, protonix, 10% dextrose _1 /hr, heparin   Labs reviewed: K 3.8 wnl, P 2.5 wnl, Mg 2.0 wnl Hgb 9.2(L), Hct 29.3(L) Cbgs- 83, 82, 85 x 24 hrs  Diet Order:    Diet Order             Diet NPO time specified  Diet effective now                  EDUCATION NEEDS:   Not appropriate for education at this time  Skin:  Skin Assessment: Reviewed RN Assessment  Last BM:  6/3- type 7  Height:   Ht Readings from Last 1 Encounters:  01/21/22 5' 7.01" (1.702 m)    Weight:   Wt Readings from Last 1 Encounters:  01/23/22 77.8 kg    Ideal Body Weight:  61.4 kg  BMI:  Body mass index is 26.86 kg/m.  Estimated Nutritional Needs:   Kcal:  1600-1800kcal/day  Protein:  80-90g/day  Fluid:  1.6-1.8L/day  [name] Reiger Koleen DistanceD, LDN Please refer to [facility] and/or RD on-call/weekend/after hours pager

## 2022-01-23 NOTE — Evaluation (Signed)
Clinical/Bedside Swallow Evaluation Patient Details  Name: Kristin [name] MRN: [id number] Date of Birth: Feb 24, 1930  Today's Date: 01/23/2022 Time: SLP Start Time (ACUTE ONLY): 0930 SLP Stop Time (ACUTE ONLY): 6294 SLP Time Calculation (min) (ACUTE ONLY): 74 min  Past Medical History:  Past Medical History:  Diagnosis Date   Acute postoperative pain 02/09/2018   Arrhythmia, sinus node 04/03/2014   Arthritis    Arthritis    Atrial fibrillation (HCC)    Back pain    lower back chronic   Bradycardia    Breast cancer ([place]) 2004   right breast cancer   Cancer ([place]) 2004   rt breast cancer-post lumpectomy- chemo/rad   CHF (congestive heart failure) (HCC)    Chronic back pain    CKD (chronic kidney disease)    COPD (chronic obstructive pulmonary disease) ([place])    wears O2 at 2L via [place] at night   COVID-19 06/11/2020   Cystocele    Decubitus ulcers    Dehydration    Gout    Hematuria    Hepatitis C    Hyperlipidemia    Hypertension    Hypomagnesemia 06/25/2015   NSTEMI (non-ST elevated myocardial infarction) ([place]) 04/08/2021   OAB (overactive bladder)    Overactive bladder    Overdose opiate, accidental or unintentional, sequela 10/02/2020   Restless leg    Shoulder pain, bilateral    Urinary frequency    UTI (lower urinary tract infection)    Vaginal atrophy    Past Surgical History:  Past Surgical History:  Procedure Laterality Date   ABDOMINAL HYSTERECTOMY     BACK SURGERY     BREAST LUMPECTOMY Right 2004   BREAST SURGERY     rt lumpectomy   CARPAL TUNNEL RELEASE     CATARACT EXTRACTION W/PHACO  10/19/2011   Procedure: CATARACT EXTRACTION PHACO AND INTRAOCULAR LENS PLACEMENT ([place]);  Surgeon: Elta Guadeloupe T. Gershon Crane, MD;  Location: [facility];  Service: Ophthalmology;  Laterality: Right;  CDE:10.81   CATARACT EXTRACTION W/PHACO  11/02/2011   Procedure: CATARACT EXTRACTION PHACO AND INTRAOCULAR LENS PLACEMENT ([facility]);  Surgeon: Elta Guadeloupe T. Gershon Crane, MD;  Location: [facility];  Service:  Ophthalmology;  Laterality: Left;  CDE 8.60   CHOLECYSTECTOMY     3/18   COLON SURGERY     INSERT / REPLACE / REMOVE PACEMAKER     JOINT REPLACEMENT     bilateral TKA   LOWER EXTREMITY ANGIOGRAPHY Left 10/14/2020   Procedure: LOWER EXTREMITY ANGIOGRAPHY;  Surgeon: Katha Cabal, MD;  Location: [facility];  Service: Cardiovascular;  Laterality: Left;   LOWER EXTREMITY ANGIOGRAPHY Right 09/08/2021   Procedure: LOWER EXTREMITY ANGIOGRAPHY;  Surgeon: Katha Cabal, MD;  Location: [facility];  Service: Cardiovascular;  Laterality: Right;   PACEMAKER INSERTION N/A 12/24/2015   Procedure: INSERTION PACEMAKER;  Surgeon: Isaias Cowman, MD;  Location: [facility];  Service: Cardiovascular;  Laterality: N/A;   ROTATOR CUFF REPAIR     bilateral   VIDEO BRONCHOSCOPY WITH ENDOBRONCHIAL NAVIGATION Right 11/11/2021   Procedure: VIDEO BRONCHOSCOPY WITH ENDOBRONCHIAL NAVIGATION;  Surgeon: Ottie Glazier, MD;  Location: [facility];  Service: Thoracic;  Laterality: Right;   HPI:  Per admitting H&P "[name] is a 88 y.o. 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  Son at bedside and contributes to history.  Patient unable to arouse to answer questions  ED course and data review: Vitals within normal limits on arrival.  ABG on 4 L shows pH 7.27 with PCO2 of 50 and PO2 of 60.  CBC unremarkable and lactic acid 1.5.  CMP significant for creatinine of 1.5 above baseline of 1.18.  Troponin 62.  COVID-negative.  EKG, personally viewed and interpreted shows sinus rhythm at 64 with no acute ST-T wave changes.  Chest x-ray shows the following:  IMPRESSION:  Multifocal patchy  opacities, progressive when compared to prior.  While some of this may reflect tumor in the right upper lobe,  superimposed multifocal pneumonia is suspected."    Assessment / Plan / Recommendation  Clinical Impression  Pt presents with moderate to high risk for aspiration with extended feeding. Pt was awake and cooperative, speech often diffcult to understand. Oral mech exam revealed dry oral cavity but adequate tongue, lips and face strength fo rmanipulaton of food. Given water by tsp Pt did present with delayed swallow needing cues aloing with throat clearing. Pt tolerated ice chip well although she needed periods of rest between trials. Given bites of applesauce, no immediate s/s of aspiration were observed, although Pt had slow oral transit, decreased sats and increased SOB. Pt needed rest breaks between bites as any effort resulted in sats dropping. Did not test thick liquids or solids secondary to fatigue and increased risk for aspiration. At this itme, rec continue NPO except for meds crushed in applesauce followed by a few therapeutic bites of applesauce as long as Pt tolerates it. Pt may also have ice chips in moderation after oral care and fully seated upright. Pt is not ready for meals as her sats drop and she fatigues easily placing her at high risk for aspiration. MD may want to go ahead and address alternative nutrtion based on palliative care consult in the event that she does not show significant improvement over the next few days. ST to follow and reassess tomorrow for possible readiness of trying more PO's. Prognosis fair, SLP Visit Diagnosis: Dysphagia, oropharyngeal phase (R13.12)    Aspiration Risk  Moderate aspiration risk;Severe aspiration risk    Diet Recommendation NPO except meds;Ice chips PRN after oral care   Medication Administration: Crushed with puree Compensations: Minimize environmental distractions;Slow rate;Small sips/bites;Other (Comment) (allow periods of  rest) Postural Changes: Seated upright at 90 degrees;Remain upright for at least 30 minutes after po intake    Other  Recommendations   Pt/OT   Recommendations for follow up therapy are one component of a multi-disciplinary discharge planning process, led by the attending physician.  Recommendations may be updated based on patient status, additional functional criteria and insurance authorization.  Follow up Recommendations Skilled nursing-short term rehab (<3 hours/day)      Assistance Recommended at Discharge Frequent or constant Supervision/Assistance  Functional Status Assessment  Decline in status  Frequency and Duration min 5x/week  1 week       Prognosis Prognosis for Safe Diet Advancement: Fair Barriers to Reach Goals: Severity of deficits      Swallow Study   General Date of Onset: 01/17/22 HPI: Per admitting H&P "GAZELLA ANGLIN is a 88 y.o. 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  Son at bedside and contributes to history.  Patient unable to arouse to answer questions  ED course and data review: Vitals within normal limits on arrival.  ABG on 4 L shows pH 7.27 with PCO2 of 50 and PO2 of 60.  CBC unremarkable and lactic acid 1.5.  CMP significant for creatinine of 1.5 above baseline of 1.18.  Troponin 62.  COVID-negative.  EKG, personally viewed and interpreted shows sinus rhythm at 64 with no acute ST-T wave changes.  Chest x-ray shows the following:  IMPRESSION:  Multifocal patchy opacities, progressive when compared to prior.  While some of this may reflect tumor in the right upper lobe,  superimposed multifocal pneumonia is suspected." Type of Study:  Bedside Swallow Evaluation Diet Prior to this Study: NPO Temperature Spikes Noted: No Respiratory Status: Nasal cannula History of Recent Intubation: Yes Length of Intubations (days): 4 days Date extubated: 01/21/22 Behavior/Cognition: Cooperative;Pleasant mood Oral Cavity Assessment: Dry Oral Care Completed by SLP: Yes Oral Cavity - Dentition: Other (Comment) (has dentures) Vision: Impaired for self-feeding Self-Feeding Abilities: Total assist Patient Positioning: Upright in bed Baseline Vocal Quality: Breathy;Hoarse;Low vocal intensity Volitional Cough: Weak Volitional Swallow: Unable to elicit    Oral/Motor/Sensory Function Overall Oral Motor/Sensory Function: Within functional limits   Ice Chips Ice chips:  (slow often needed cues to swallow) Presentation: Spoon   Thin Liquid Thin Liquid: Impaired Presentation: Spoon (x2, throat clear and delayed swallow/posterior spillage suspected) Oral Phase Impairments: Reduced labial seal Pharyngeal  Phase Impairments: Change in Vital Signs;Suspected delayed Swallow;Throat Clearing - Delayed    Nectar Thick Nectar Thick Liquid: Not tested   Honey Thick Honey Thick Liquid: Not tested   Puree Puree: Impaired Presentation: Spoon Oral Phase Impairments: Reduced lingual movement/coordination Oral Phase Functional Implications: Prolonged oral transit Pharyngeal Phase Impairments: Suspected delayed Swallow;Decreased hyoid-laryngeal movement   Solid     Solid: Not tested      Lucila Maine 01/23/2022,10:44 AM

## 2022-01-23 NOTE — Progress Notes (Signed)
PHARMACY CONSULT NOTE  Pharmacy Consult for Electrolyte Monitoring and Replacement   Recent Labs: Potassium (mmol/L)  Date Value  01/23/2022 3.8  09/06/2014 4.4   Magnesium (mg/dL)  Date Value  01/23/2022 2.0  01/16/2014 2.0   Calcium (mg/dL)  Date Value  01/23/2022 9.5   Calcium, Total (mg/dL)  Date Value  09/06/2014 9.0   Albumin (g/dL)  Date Value  01/16/2022 3.3 (L)  07/10/2014 3.2 (L)   Phosphorus (mg/dL)  Date Value  01/23/2022 2.5   Sodium (mmol/L)  Date Value  01/23/2022 135  09/06/2014 138   Assessment: 67 yof w/ PMH of CKD 3A, lung cancer s/p radiation, paroxysmal A-fib on anticoagulation, chronic diastolic CHF, HTN, restless leg syndrome, 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 Pharmacy is asked to follow and replace electrolytes  MIVF: D10w at 20 cc/hr  Goal of Therapy:  Electrolytes within normal limits  Plan:  --No electrolyte replacement indicated at this time --Follow-up electrolytes with AM labs tomorrow  Benita Gutter 01/23/2022 7:30 AM

## 2022-01-23 NOTE — Evaluation (Signed)
Occupational Therapy Evaluation Patient Details Name: Kristin [name] MRN: [id number] DOB: 02-Jan-1930 Today's Date: 01/23/2022   History of Present Illness 88 year old female presenting [facility] ED from home via EMS on 01/16/2022 with complaints of lethargy and weakness.  Per the patient's granddaughter bedside, [name], patient was in her normal state of health until earlier in the evening on 01/16/2022 when she picked up the phone but did not respond.  Patient's granddaughter lives next door and walked over to check on her finding her sitting at the kitchen table with her head on the table.  She was responsive and able to talk but very weak.  Only other thing family noticed was that the patient was sleeping more than usual.  No other complaints that family was aware of. PMHx significant for atrial fibrillation, COPD, HLD, HTN, and gout.   Clinical Impression   Chart reviewed, RN cleared pt for participation in OT evaluation. Co tx completed with PT on this date. Son present throughout. Son reports pt was MOD I in ADL PTA, was receiving home health therapy services, amb with RW. At this time pt requires increased assist for completion of all ADL/IADL and is performing ADL below PLOF. Pt presents with impairments in BUE strength, activity tolerance, endurance, cognition. Pt would benefit from discharge to STR pending goals of care to address deficits, son is aware of recommendations. OT will continue to follow acutely.      Recommendations for follow up therapy are one component of a multi-disciplinary discharge planning process, led by the attending physician.  Recommendations may be updated based on patient status, additional functional criteria and insurance authorization.   Follow Up Recommendations  Skilled nursing-short term rehab (<3 hours/day)    Assistance Recommended at Discharge Frequent or constant Supervision/Assistance  Patient can return home with the following A lot of help with  walking and/or transfers;A lot of help with bathing/dressing/bathroom    Functional Status Assessment  Patient has had a recent decline in their functional status and demonstrates the ability to make significant improvements in function in a reasonable and predictable amount of time.  Equipment Recommendations  Other (comment) (defer)    Recommendations for Other Services       Precautions / Restrictions Precautions Precautions: Fall Restrictions Weight Bearing Restrictions: No      Mobility Bed Mobility Overal bed mobility: Needs Assistance Bed Mobility: Supine to Sit, Sit to Supine     Supine to sit: Min assist, +2 for physical assistance, HOB elevated Sit to supine: Min assist, +2 for physical assistance        Transfers Overall transfer level: Needs assistance Equipment used: Rolling walker (2 wheels) Transfers: Sit to/from Stand Sit to Stand: Mod assist, +2 physical assistance, +2 safety/equipment           General transfer comment: step by step by step vcs required      Balance Overall balance assessment: Needs assistance Sitting-balance support: Feet supported Sitting balance-Leahy Scale: Poor   Postural control: Left lateral lean   Standing balance-Leahy Scale: Zero                             ADL either performed or assessed with clinical judgement   ADL Overall ADL's : Needs assistance/impaired  General ADL Comments: MOD A anticipated for UB dressing, MAX A required for LB dressing, MOD A required for grooming tasks at bed level     Vision   Additional Comments: to be further assessed     Perception     Praxis      Pertinent Vitals/Pain Pain Assessment Pain Assessment: Faces Faces Pain Scale: Hurts a little bit Pain Location: generalized Pain Descriptors / Indicators: Grimacing Pain Intervention(s): Monitored during session     Hand Dominance Right    Extremity/Trunk Assessment Upper Extremity Assessment Upper Extremity Assessment: RUE deficits/detail;LUE deficits/detail RUE Deficits / Details: AROM shoulder flexion approx 1/2 full ROM, elbow, 1/2 full ROM, wrist unable RUE: Shoulder pain with ROM LUE Deficits / Details: AROM shoulder flexion approx 1/2 full ROM, elbow, 1/2 full ROM, wrist unable LUE: Shoulder pain with ROM   Lower Extremity Assessment Lower Extremity Assessment: Defer to PT evaluation;Generalized weakness RLE Deficits / Details: 2/5 gross strength. Pt unable to move within full ROM in gravity dependent position. RLE Sensation: WNL RLE Coordination: decreased gross motor LLE Deficits / Details: 2/5 gross strength. Pt unable to move within full ROM in gravity dependent position. LLE Sensation: WNL   Cervical / Trunk Assessment Cervical / Trunk Assessment: Other exceptions Cervical / Trunk Exceptions: L lateral flexion and rotation noted, pt able to be placed in neutral however returns to L lateral flexion   Communication Communication Communication: Expressive difficulties   Cognition Arousal/Alertness: Awake/alert Behavior During Therapy: WFL for tasks assessed/performed Overall Cognitive Status: Impaired/Different from baseline Area of Impairment: Orientation, Memory, Attention, Following commands, Awareness, Problem solving, Safety/judgement                 Orientation Level: Disoriented to, Situation, Time Current Attention Level: Sustained Memory: Decreased recall of precautions, Decreased short-term memory Following Commands: Follows one step commands with increased time Safety/Judgement: Decreased awareness of safety, Decreased awareness of deficits Awareness: Intellectual Problem Solving: Slow processing, Decreased initiation, Difficulty sequencing, Requires verbal cues, Requires tactile cues       General Comments       Exercises Other Exercises Other Exercises: edu re: role of OT, role of  rehab, discharge recommendations   Shoulder Instructions      Home Living Family/patient expects to be discharged to:: Private residence Living Arrangements: Children (grand daugther lives next door) Available Help at Discharge: Family;Available PRN/intermittently Type of Home: House Home Access: Level entry     Home Layout: Able to live on main level with bedroom/bathroom;One level;Laundry or work area in basement     [employer]: Occupational psychologist: Handicapped height Bathroom Accessibility: Yes   Home Equipment: Marine scientist - single point;Grab bars - toilet;Grab bars - tub/shower;Rollator (4 wheels);Wheelchair - manual          Prior Functioning/Environment Prior Level of Function : Independent/Modified Independent             Mobility Comments: amb with RW ADLs Comments: MOD I- I with all ADL, assist with IADL; son does shopping, drives, cleans;        OT Problem List: Decreased strength;Decreased activity tolerance;Impaired balance (sitting and/or standing);Decreased safety awareness;Impaired UE functional use;Decreased knowledge of use of DME or AE;Decreased range of motion;Decreased coordination      OT Treatment/Interventions: Self-care/ADL training;Therapeutic exercise;Patient/family education;Energy conservation;DME and/or AE instruction;Therapeutic activities    OT Goals(Current goals can be found in the care plan section) Acute Rehab OT Goals Patient Stated Goal: get stronger OT Goal Formulation: With patient/family  Time For Goal Achievement: 02/06/22 Potential to Achieve Goals: Good ADL Goals Pt Will Perform Grooming: with min assist Pt Will Perform Upper Body Dressing: with min assist Pt Will Perform Lower Body Dressing: with min assist;sit to/from stand Pt Will Transfer to Toilet: with min assist;stand pivot transfer;bedside commode Pt Will Perform Toileting - Clothing Manipulation and hygiene: with min assist;sit to/from  stand  OT Frequency: Min 2X/week    Co-evaluation PT/OT/SLP Co-Evaluation/Treatment: Yes Reason for Co-Treatment: Complexity of the patient's impairments (multi-system involvement);For patient/therapist safety PT goals addressed during session: Mobility/safety with mobility;Balance OT goals addressed during session: ADL's and self-care      AM-PAC OT "6 Clicks" Daily Activity     Outcome Measure Help from another person eating meals?: A Lot Help from another person taking care of personal grooming?: A Lot Help from another person toileting, which includes using toliet, bedpan, or urinal?: A Lot Help from another person bathing (including washing, rinsing, drying)?: A Lot Help from another person to put on and taking off regular upper body clothing?: A Little Help from another person to put on and taking off regular lower body clothing?: A Lot 6 Click Score: 13   End of Session Equipment Utilized During Treatment: Rolling walker (2 wheels);Oxygen Nurse Communication: Mobility status  Activity Tolerance: Patient tolerated treatment well Patient left: in bed;with call bell/phone within reach;with family/visitor present  OT Visit Diagnosis: Muscle weakness (generalized) (M62.81);Unsteadiness on feet (R26.81)                Time: 1353-1430 OT Time Calculation (min): 37 min Charges:  OT General Charges $OT Visit: 1 Visit OT Evaluation $OT Eval High Complexity: 1 High OT Treatments $Self Care/Home Management : 8-22 mins  Shanon Payor, OTD OTR/L  01/23/22, 3:48 PM

## 2022-01-23 NOTE — Progress Notes (Signed)
[place] for heparin infusion Indication: atrial fibrillation  Allergies  Allergen Reactions   Flexeril [Cyclobenzaprine Hcl]     Severe Rash   Vancomycin Rash    [place] Syndrome    Patient Measurements: Height: 5' 7.01" (170.2 cm) Weight: 77.2 kg (170 lb 3.1 oz) IBW/kg (Calculated) : 61.62 Heparin Dosing Weight: 75.8 kg  Vital Signs: Temp: 99.9 F (37.7 C) (06/03 0400) Temp Source: Axillary (06/03 0400) BP: 108/57 (06/03 0400) Pulse Rate: 81 (06/03 0400)  Labs: Recent Labs    01/21/22 0448 01/22/22 0445 01/23/22 0336  HGB 11.0* 9.8* 9.2*  HCT 36.1 31.8* 29.3*  PLT 162 129* 132*  HEPARINUNFRC 0.49 0.34 0.29*  CREATININE 1.10* 0.91 0.88     Estimated Creatinine Clearance: 45.5 mL/min (by C-G formula based on SCr of 0.88 mg/dL).   Medical History: Past Medical History:  Diagnosis Date   Acute postoperative pain 02/09/2018   Arrhythmia, sinus node 04/03/2014   Arthritis    Arthritis    Atrial fibrillation (HCC)    Back pain    lower back chronic   Bradycardia    Breast cancer ([place]) 2004   right breast cancer   Cancer ([place]) 2004   rt breast cancer-post lumpectomy- chemo/rad   CHF (congestive heart failure) (HCC)    Chronic back pain    CKD (chronic kidney disease)    COPD (chronic obstructive pulmonary disease) ([place])    wears O2 at 2L via [place] at night   COVID-19 06/11/2020   Cystocele    Decubitus ulcers    Dehydration    Gout    Hematuria    Hepatitis C    Hyperlipidemia    Hypertension    Hypomagnesemia 06/25/2015   NSTEMI (non-ST elevated myocardial infarction) ([place]) 04/08/2021   OAB (overactive bladder)    Overactive bladder    Overdose opiate, accidental or unintentional, sequela 10/02/2020   Restless leg    Shoulder pain, bilateral    Urinary frequency    UTI (lower urinary tract infection)    Vaginal atrophy   Heparin Dosing Weight: 75.8 kg  Medications:  PTA Med: Apixaban 2.5 mg BID last dose  5/27 (AM) Pt given Lovenox 75 mg 5/29 @ 1500  Assessment: Pt is 88 yo female being transitioned from therapeutic Lovenox to Heparin for A. Fib.  Date Time aPTT/HL Rate/Comment 5/30 0030 58s / 0.81 Labs prior to UFH gtt; (last dose of lovenox 75mg  5/29 1500) 5/30 1945 61s / 0.46 Therapeutic HL; correlation noted. 1100 un/hr     Baseline Labs: aPTT - unk INR - 1.7 Hgb - 12>10.3 Plts - 180>173  Goal of Therapy:  Heparin level 0.3-0.7 units/ml aPTT 66-102 seconds Monitor platelets by anticoagulation protocol: Yes   Plan:  Heparin level slightly subtherapeutic 0.29. Increase heparin infusion to 1200 units/hr. Recheck HL in 8 hr after rate change. CBC daily while on heparin.  Renda Rolls, PharmD, [facility] 01/23/2022 5:46 AM

## 2022-01-24 DIAGNOSIS — J189 Pneumonia, unspecified organism: Secondary | ICD-10-CM | POA: Diagnosis not present

## 2022-01-24 LAB — GLUCOSE, CAPILLARY
Glucose-Capillary: 102 mg/dL — ABNORMAL HIGH (ref 70–99)
Glucose-Capillary: 108 mg/dL — ABNORMAL HIGH (ref 70–99)
Glucose-Capillary: 111 mg/dL — ABNORMAL HIGH (ref 70–99)
Glucose-Capillary: 113 mg/dL — ABNORMAL HIGH (ref 70–99)
Glucose-Capillary: 120 mg/dL — ABNORMAL HIGH (ref 70–99)
Glucose-Capillary: 127 mg/dL — ABNORMAL HIGH (ref 70–99)

## 2022-01-24 LAB — CBC WITH DIFFERENTIAL/PLATELET
Abs Immature Granulocytes: 0.04 10*3/uL (ref 0.00–0.07)
Basophils Absolute: 0 10*3/uL (ref 0.0–0.1)
Basophils Relative: 0 %
Eosinophils Absolute: 0.5 10*3/uL (ref 0.0–0.5)
Eosinophils Relative: 10 %
HCT: 29.1 % — ABNORMAL LOW (ref 36.0–46.0)
Hemoglobin: 9.1 g/dL — ABNORMAL LOW (ref 12.0–15.0)
Immature Granulocytes: 1 %
Lymphocytes Relative: 11 %
Lymphs Abs: 0.6 10*3/uL — ABNORMAL LOW (ref 0.7–4.0)
MCH: 26.8 pg (ref 26.0–34.0)
MCHC: 31.3 g/dL (ref 30.0–36.0)
MCV: 85.8 fL (ref 80.0–100.0)
Monocytes Absolute: 0.5 10*3/uL (ref 0.1–1.0)
Monocytes Relative: 8 %
Neutro Abs: 3.8 10*3/uL (ref 1.7–7.7)
Neutrophils Relative %: 70 %
Platelets: 146 10*3/uL — ABNORMAL LOW (ref 150–400)
RBC: 3.39 MIL/uL — ABNORMAL LOW (ref 3.87–5.11)
RDW: 18.6 % — ABNORMAL HIGH (ref 11.5–15.5)
WBC: 5.4 10*3/uL (ref 4.0–10.5)
nRBC: 0 % (ref 0.0–0.2)

## 2022-01-24 LAB — COMPREHENSIVE METABOLIC PANEL
ALT: 14 U/L (ref 0–44)
AST: 19 U/L (ref 15–41)
Albumin: 2.3 g/dL — ABNORMAL LOW (ref 3.5–5.0)
Alkaline Phosphatase: 62 U/L (ref 38–126)
Anion gap: 4 — ABNORMAL LOW (ref 5–15)
BUN: 19 mg/dL (ref 8–23)
CO2: 23 mmol/L (ref 22–32)
Calcium: 9.4 mg/dL (ref 8.9–10.3)
Chloride: 109 mmol/L (ref 98–111)
Creatinine, Ser: 0.75 mg/dL (ref 0.44–1.00)
GFR, Estimated: >60 mL/min (ref >60)
Glucose, Bld: 107 mg/dL — ABNORMAL HIGH (ref 70–99)
Potassium: 3.5 mmol/L (ref 3.5–5.1)
Sodium: 136 mmol/L (ref 135–145)
Total Bilirubin: 0.7 mg/dL (ref 0.3–1.2)
Total Protein: 5.8 g/dL — ABNORMAL LOW (ref 6.5–8.1)

## 2022-01-24 LAB — PHOSPHORUS: Phosphorus: 2.8 mg/dL (ref 2.5–4.6)

## 2022-01-24 LAB — MAGNESIUM: Magnesium: 2 mg/dL (ref 1.7–2.4)

## 2022-01-24 LAB — TRIGLYCERIDES: Triglycerides: 66 mg/dL (ref <150)

## 2022-01-24 MED ORDER — LEVALBUTEROL HCL 0.63 MG/3ML IN NEBU
0.6300 mg | INHALATION_SOLUTION | Freq: Two times a day (BID) | RESPIRATORY_TRACT | Status: DC
  Filled 2022-01-24: qty 3

## 2022-01-24 MED ORDER — POTASSIUM CHLORIDE 20 MEQ PO PACK
20.0000 meq | PACK | Freq: Once | ORAL | Status: AC
  Administered 2022-01-24: 20 meq via ORAL
  Filled 2022-01-24: qty 1

## 2022-01-24 MED ORDER — TRAVASOL 10 % IV SOLN
INTRAVENOUS | Status: AC
  Filled 2022-01-24: qty 648

## 2022-01-24 NOTE — Progress Notes (Addendum)
Speech Language Pathology Treatment:    Patient Details Name: Kristin [name] MRN: [id number] DOB: [birth year] Today's Date: 01/24/2022 Time: 1125-1140 SLP Time Calculation (min) (ACUTE ONLY): 15 min  Assessment / Plan / Recommendation Clinical Impression  Pt seen for clinical swallowing re-evaluation. Pt lethargic, but able to rouse for safe PO intake. On 4L/min O2 via [place]. Xerostomia appreciated, but improved with oral care by SLP and PO trials. Speech often difficult to understanding. Patent attorney for POs.  Pt given trials of thin liquids (via tsp), nectar-thick liquids (via tsp and cup sips; 4 oz total), and pureed (via tsp; 2 oz). Pt presents with s/sx oral dysphagia including inconsistently delayed A-P transit with pureed. Concerns for highly suspected pharyngeal dysphagia noted including immediate cough following tsp of water. No other overt s/sx pharyngeal dysphagia noted across trials. Solid trial deferred at this time given edentulism and s/sx oral dysphagia with pureed textures.   Recommend cautious initiation of a pureed diet with nectar-thick liquids with safe swallowing strategies/aspiration precautions as outlined below including assistance with feeding and avoidance of straws.  Pt remains at increased risk for aspiration/aspiration PNA given dental status, mental status, medical comorbidities (including respiratory status), advanced age, and dependence for feeding.   SLP to f/u per POC for diet tolerance and clinical swallowing re-evaluation as appropriate.   Pt and RN made aware of results, recommendations, and SLP POC. ?full understanding by pt. Signage in pt's room updated for reinforcement of content by medical team.    HPI HPI: Per admitting H&P "Kristin [name] is a 88 y.o. 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  Son at bedside and contributes to history.  Patient unable to arouse to answer questions  ED course and data review: Vitals within normal limits on arrival.  ABG on 4 L shows pH 7.27 with PCO2 of 50 and PO2 of 60.  CBC unremarkable and lactic acid 1.5.  CMP significant for creatinine of 1.5 above baseline of 1.18.  Troponin 62.  COVID-negative.  EKG, personally viewed and interpreted shows sinus rhythm at 64 with no acute ST-T wave changes.  Chest x-ray shows the following:  IMPRESSION:  Multifocal patchy opacities, progressive when compared to prior.  While some of this may reflect tumor in the right upper lobe,  superimposed multifocal pneumonia is suspected."      SLP Plan  Continue with current plan of care      Recommendations for follow up therapy are one component of a multi-disciplinary discharge planning process, led by the attending physician.  Recommendations may be updated based on patient status, additional functional criteria and insurance authorization.    Recommendations  Diet recommendations: Dysphagia 1 (puree);Nectar-thick liquid Liquids provided via: No straw Medication Administration: Crushed with puree Supervision: Full supervision/cueing for compensatory strategies;Staff to assist with self feeding Compensations: Minimize environmental distractions;Slow rate;Small sips/bites;Other (Comment) (rests breaks as needed) Postural Changes and/or Swallow Maneuvers: Out of bed for meals;Seated upright 90 degrees;Upright 30-60 min after meal                Follow Up Recommendations: Skilled nursing-short term rehab (<3 hours/day) Assistance recommended at discharge: Frequent or constant Supervision/Assistance SLP Visit Diagnosis: Dysphagia, oropharyngeal phase (  R13.12) Plan: Continue with current plan of care           Cherrie Gauze, M.S., [facility] [id number] [place])   Quintella Baton  01/24/2022, 11:52 AM

## 2022-01-24 NOTE — Consult Note (Signed)
PHARMACY - TOTAL PARENTERAL NUTRITION CONSULT NOTE   Indication:  Prolonged NPO and inability to place enteral access for nutrition  Patient Measurements: Height: 5' 7.01" (170.2 cm) Weight: 77.8 kg (171 lb 8.3 oz) IBW/kg (Calculated) : 61.62   Body mass index is 26.86 kg/m.  Assessment:  Patient is a 88 y/o F with medical history including Afib on apixaban, breast cancer in 2004, HFpEF, COPD, gout, hepatitis C, HLD, HTN, NSTEMI, lung cancer who is admitted 5/27 with acute respiratory failure in setting of aspiration / acute encephalopathy. Patient was intubated 5/28 thru 6/1. SLP consulted and is recommending NPO except medications. Attempts to place NG tube at bedside have been unsuccessful. Further options for enteral access are being discussed. In the meantime, pharmacy has been consulted to initiate TPN to provide nutrition.  Glucose / Insulin: Last Hgb A1c was 6.1% on 11/05/21. Patient has been normoglycemic. 0 units of SSI required in the last 24 hours Electrolytes: No abnormalities at present. Potassium at LLN Renal: Scr < 1 Hepatic: LFTs within normal limits Intake / Output; MIVF: I&O + 3.8 L for the admission GI Imaging: N/A GI Surgeries / Procedures: N/A  Central access: PICC placed 5/28 TPN start date: 6/3  Nutritional Goals: Goal TPN rate is 70 mL/hr (provides 90 g of protein and 1830 kcals per day)  RD Assessment: Estimated Needs Total Energy Estimated Needs: 1600-1800kcal/day Total Protein Estimated Needs: 80-90g/day Total Fluid Estimated Needs: 1.6-1.8L/day  Current Nutrition:  NPO  Plan:  --Advance TPN to 50 mL/hr at 1800 --Electrolytes in TPN: Na 47mEq/L, K 5mEq/L, Ca 18mEq/L, Mg 22mEq/L, and Phos 13mmol/L. Cl:Ac 1:1 Kcl 20 mEq PO x 1 dose today --Add standard MVI and trace elements to TPN, thiamine 100 mg x 3 days (day # 2 / 3) --Continue Sensitive q8h SSI and adjust as needed; can consider discontinuation if patient tolerates full rate TPN x 48 hours  without insulin requirement  --Monitor TPN labs on Mon/Thurs, daily until stable  Benita Gutter 01/24/2022,7:45 AM

## 2022-01-24 NOTE — Progress Notes (Signed)
PROGRESS NOTE  Kristin [name] IHW:[id number] DOB: Jun 23, 1930 DOA: 01/16/2022 PCP: [name], MD  Brief History   88 year old female presenting to [facility] ED from home via EMS on 01/16/2022 with complaints of lethargy and weakness.  Per the patient's granddaughter bedside, [name], patient was in her normal state of health until earlier in the evening on 01/16/2022 when she picked up the phone but did not respond.  Patient's granddaughter lives next door and walked over to check on her finding her sitting at the kitchen table with her head on the table.  She was responsive and able to talk but very weak.  Only other thing family noticed was that the patient was sleeping more than usual.  No other complaints that family was aware of.  In the ED the patient was found to have multifocal patchy opacities on CXR. There was suspected to be a tumor in the right upper lobe with superimposed multifocal pneumonia. The patient then decompensated and required mechanical ventilation for protection of airway after she lost consciousness.  Echocardiogram was obtained that demonstrated EF was 50-55%. LV showed low normal function. There were no regional wall motion abnormalities. There is mild left ventricular hypertrophy and grade I diastolic dysfunction.   The right ventricular systolic function is moderately reduced. The RV is severely enlarged. Recommendation was for CTA chest once renal status resolved.   On 01/18/2022 the patient displayed seizure-like activity while on the ventilator. She was loaded with Keppra and neurology was consulted. EEG demonstrated moderate to severe diffuse encephalopathy of indeterminate etiology.  Oncology was consulted as the patient carries a known diagnosis of limited stage small cell lung CA. Dr. Rogue Bussing in his evaluated stated that the patient is not a candidate for chemotherapy. The patient has receive palliative radiation only. She has an overall poor prognosis. He  recommended CT head with and without contrast to rule out brain mets. This was performed on 01/20/2022. No abnormal pathology was seen.  The patient was also having AF with RVR. She has had a PPM placed. She is on a heparin drip.  She was extubated on 01/21/2022. The patient remained encephalopathic, but she was communicative. Sedating medications were avoided. The following day the patient seemed to be struggling off of the ventilator, and palliative care consult was obtained.  Palliative care discussed the patient with the family. The family stated that they wished to maintain the full scope of care. They wished to keep her a full code. They also stated that if necessary they would want her to have a PEG tube. She wanted to continue to live "at all costs".  The patient was evaluated by SLP. She is felt to be at high risk of aspiration. The recommendation is for NPO. The patient is currently receiving TPN as she had been NPo for a prolonged period of time. Attempts to place a NGT have failed. Further options for enteral access will be considered if there are no signs of improvement in her swallow in a day or two.  The patient was moved out to the floor on 01/23/2022.  Consultants  PCCM Oncology Neurology  Procedures  Mechanical ventilation and extubation  Antibiotics   Anti-infectives (From admission, onward)    Start     Dose/Rate Route Frequency Ordered Stop   01/20/22 1800  levofloxacin (LEVAQUIN) IVPB 750 mg        750 mg 100 mL/hr over 90 Minutes Intravenous Every 48 hours 01/20/22 1447 01/22/22 1909   01/19/22 1800  Ampicillin-Sulbactam (  UNASYN) 3 g in sodium chloride 0.9 % 100 mL IVPB  Status:  Discontinued        3 g 200 mL/hr over 30 Minutes Intravenous Every 6 hours 01/19/22 1427 01/20/22 1443   01/19/22 0600  vancomycin (VANCOREADY) IVPB 1250 mg/250 mL  Status:  Discontinued        1,250 mg 83.3 mL/hr over 180 Minutes Intravenous Every 48 hours 01/17/22 0508 01/17/22 1045    01/19/22 0600  vancomycin (VANCOREADY) IVPB 1500 mg/300 mL  Status:  Discontinued        1,500 mg 75 mL/hr over 240 Minutes Intravenous Every 48 hours 01/17/22 1045 01/18/22 1339   01/18/22 1500  Ampicillin-Sulbactam (UNASYN) 3 g in sodium chloride 0.9 % 100 mL IVPB  Status:  Discontinued        3 g 200 mL/hr over 30 Minutes Intravenous Every 12 hours 01/18/22 1339 01/19/22 1427   01/18/22 0100  cefTRIAXone (ROCEPHIN) 2 g in sodium chloride 0.9 % 100 mL IVPB  Status:  Discontinued        2 g 200 mL/hr over 30 Minutes Intravenous Every 24 hours 01/17/22 0121 01/17/22 0445   01/17/22 0545  piperacillin-tazobactam (ZOSYN) IVPB 3.375 g  Status:  Discontinued        3.375 g 12.5 mL/hr over 240 Minutes Intravenous Every 8 hours 01/17/22 0455 01/18/22 1339   01/17/22 0545  vancomycin (VANCOREADY) IVPB 1750 mg/350 mL        1,750 mg 87.5 mL/hr over 240 Minutes Intravenous  Once 01/17/22 0457 01/17/22 0929   01/17/22 0145  cefTRIAXone (ROCEPHIN) 1 g in sodium chloride 0.9 % 100 mL IVPB  Status:  Discontinued       Note to Pharmacy: Give following initial 1 gm dose to make total dose of 2 gm .   1 g 200 mL/hr over 30 Minutes Intravenous  Once 01/17/22 0136 01/17/22 0445   01/17/22 0130  azithromycin (ZITHROMAX) 500 mg in sodium chloride 0.9 % 250 mL IVPB  Status:  Discontinued        500 mg 250 mL/hr over 60 Minutes Intravenous Every 24 hours 01/17/22 0121 01/17/22 0445   01/17/22 0045  cefTRIAXone (ROCEPHIN) 1 g in sodium chloride 0.9 % 100 mL IVPB        1 g 200 mL/hr over 30 Minutes Intravenous  Once 01/17/22 0034 01/17/22 0155   01/17/22 0045  azithromycin (ZITHROMAX) 500 mg in sodium chloride 0.9 % 250 mL IVPB        500 mg 250 mL/hr over 60 Minutes Intravenous  Once 01/17/22 0034 01/17/22 0302      Subjective  The patient is resting comfortably. She is minimally interactive. No new complaints.  Objective   Vitals:  Vitals:   01/24/22 0733 01/24/22 1246  BP: 104/60 102/72  Pulse:  (!) 59 (!) 59  Resp: 18 18  Temp: 98.9 F (37.2 C) 98 F (36.7 C)  SpO2: 93% 100%    Exam:  Constitutional:  The patient is awake, alert, and oriented x 3. No acute distress. Respiratory:  No increased work of breathing. No wheezes, rales, or rhonchi No tactile fremitus Cardiovascular:  Regular rate and rhythm No murmurs, ectopy, or gallups. No lateral PMI. No thrills. Abdomen:  Abdomen is soft, non-tender, non-distended No hernias, masses, or organomegaly Normoactive bowel sounds.  Musculoskeletal:  No cyanosis, clubbing, or edema Skin:  No rashes, lesions, ulcers palpation of skin: no induration or nodules Neurologic:  CN 2-12 intact Sensation all 4  extremities intact  I have personally reviewed the following:   Today's Data  Vitals  Lab Data  CMP CBC  Micro Data  Urine culture is positive for E. Faecalis Blood culture is positive for sTaph hominis Blood culture repeated on 01/18/2022 and has had no growth  Imaging  CT Head CXR Lower extremity doppler: No DVT  Cardiology Data  EKG Echocardiogram  Other Data    Scheduled Meds:  apixaban  2.5 mg Oral BID   aspirin  81 mg Oral Daily   chlorhexidine  15 mL Mouth Rinse BID   Chlorhexidine Gluconate Cloth  6 each Topical Daily   insulin aspart  0-9 Units Subcutaneous Q8H   levalbuterol  0.63 mg Nebulization TID   lidocaine  1 patch Transdermal Q24H   mouth rinse  15 mL Mouth Rinse q12n4p   melatonin  10 mg Oral QHS   pantoprazole (PROTONIX) IV  40 mg Intravenous Q24H   rOPINIRole  4 mg Oral QHS   sodium chloride flush  10-40 mL Intracatheter Q12H   Continuous Infusions:  sodium chloride Stopped (01/17/22 1742)   sodium chloride     TPN ADULT (ION) 30 mL/hr at 01/23/22 1958   TPN ADULT (ION)      Principal Problem:   Multifocal pneumonia Active Problems:   Acute metabolic encephalopathy/unresponsive   Acute on chronic respiratory failure with hypoxia (HCC)   Chronic anticoagulation  (Eliquis)   Acute renal failure superimposed on stage 3a chronic kidney disease (HCC)   Paroxysmal atrial fibrillation (HCC)   Malignant neoplasm of right upper lobe of lung (HCC)   Long term current use of opiate analgesic   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)   Restless leg   Chronic diastolic CHF (congestive heart failure) (HCC)   COPD (chronic obstructive pulmonary disease) (HCC)   Acute respiratory failure (HCC)   Malnutrition of moderate degree   Pressure injury of skin   LOS: 7 days   A & P  Acute Hypoxic / Hypercapnic Respiratory Failure  -secondary to suspected aspiration in the setting of possible CAP & acute HFpEF exacerbation  -improved on nasal canula 3l now doing well   Chronic HFpEF exacerbation -had mild Elevated Troponin secondary to suspected demand ischemia Chronic / Paroxysmal Atrial Fibrillation - Continuous cardiac monitoring  - Daily weights to assess volume status - Diurese with the use of IV lasix, as hemodynamics and renal function permit -Continue outpatient Eliquis   Acute Kidney Injury superimposed on CKD stage 3b  Baseline Cr: 1.18, Cr on admission:1.50 - Strict I/O's: alert provider if UOP < 0.5 mL/kg/hr - Daily BMP, replace electrolytes PRN - Avoid nephrotoxic agents as able, ensure adequate renal perfusion   Acute encephalopathy secondary to unknown etiology- resolved  -Stat CT head without contrast - f/u ammonia level -Consider EEG/MRI brain if mentation does not improve with oxygenation support -Neurochecks every 4 -Avoid sedating medications as able - supportive care  Dysphagia: The patient is at high risk of aspiration. She is to remain NPO for now. She will be re-evaluated by SLP. Continue TPN for now. If she does not seem to be improving in her ability to safely swallow, she will need evaluation for PEG. This is what the family states that she would want based upon palliative care's note.  EMonia Pouch  UTI:  The patient has completed antibiotic treatment.  Non-small cell Lung CA: Appreciate Dr. Aletha Halim assistance. The patient has completed a course of palliative radiation for this. CT  head with and without contrast has not shown any signs of brain mets.  I have seen and examined this patient myself. I have spent 38 minutes in her evaluation and care.  DVT prophylaxis: Eliquis Code Status: Full Code Family Communication: I have discussed the patient with her grand-daughter Erica. All questions answered to the best of my ability. Disposition Plan: tbd    Cori Henningsen, DO [facility] Direct contact: see [email or web address]  7PM-7AM contact night coverage as above 01/24/2022, 3:39 PM  LOS: 7 days

## 2022-01-25 DIAGNOSIS — J189 Pneumonia, unspecified organism: Secondary | ICD-10-CM | POA: Diagnosis not present

## 2022-01-25 LAB — GLUCOSE, CAPILLARY
Glucose-Capillary: 106 mg/dL — ABNORMAL HIGH (ref 70–99)
Glucose-Capillary: 110 mg/dL — ABNORMAL HIGH (ref 70–99)
Glucose-Capillary: 113 mg/dL — ABNORMAL HIGH (ref 70–99)
Glucose-Capillary: 116 mg/dL — ABNORMAL HIGH (ref 70–99)
Glucose-Capillary: 119 mg/dL — ABNORMAL HIGH (ref 70–99)
Glucose-Capillary: 130 mg/dL — ABNORMAL HIGH (ref 70–99)

## 2022-01-25 LAB — CBC WITH DIFFERENTIAL/PLATELET
Abs Immature Granulocytes: 0.04 10*3/uL (ref 0.00–0.07)
Basophils Absolute: 0 10*3/uL (ref 0.0–0.1)
Basophils Relative: 1 %
Eosinophils Absolute: 0.8 10*3/uL — ABNORMAL HIGH (ref 0.0–0.5)
Eosinophils Relative: 14 %
HCT: 30.3 % — ABNORMAL LOW (ref 36.0–46.0)
Hemoglobin: 9.4 g/dL — ABNORMAL LOW (ref 12.0–15.0)
Immature Granulocytes: 1 %
Lymphocytes Relative: 13 %
Lymphs Abs: 0.7 10*3/uL (ref 0.7–4.0)
MCH: 26.7 pg (ref 26.0–34.0)
MCHC: 31 g/dL (ref 30.0–36.0)
MCV: 86.1 fL (ref 80.0–100.0)
Monocytes Absolute: 0.5 10*3/uL (ref 0.1–1.0)
Monocytes Relative: 8 %
Neutro Abs: 3.5 10*3/uL (ref 1.7–7.7)
Neutrophils Relative %: 63 %
Platelets: 154 10*3/uL (ref 150–400)
RBC: 3.52 MIL/uL — ABNORMAL LOW (ref 3.87–5.11)
RDW: 18.9 % — ABNORMAL HIGH (ref 11.5–15.5)
WBC: 5.5 10*3/uL (ref 4.0–10.5)
nRBC: 0 % (ref 0.0–0.2)

## 2022-01-25 LAB — COMPREHENSIVE METABOLIC PANEL
ALT: 12 U/L (ref 0–44)
AST: 16 U/L (ref 15–41)
Albumin: 2.2 g/dL — ABNORMAL LOW (ref 3.5–5.0)
Alkaline Phosphatase: 59 U/L (ref 38–126)
Anion gap: 4 — ABNORMAL LOW (ref 5–15)
BUN: 23 mg/dL (ref 8–23)
CO2: 23 mmol/L (ref 22–32)
Calcium: 9.6 mg/dL (ref 8.9–10.3)
Chloride: 112 mmol/L — ABNORMAL HIGH (ref 98–111)
Creatinine, Ser: 0.64 mg/dL (ref 0.44–1.00)
GFR, Estimated: >60 mL/min (ref >60)
Glucose, Bld: 111 mg/dL — ABNORMAL HIGH (ref 70–99)
Potassium: 4 mmol/L (ref 3.5–5.1)
Sodium: 139 mmol/L (ref 135–145)
Total Bilirubin: 0.2 mg/dL — ABNORMAL LOW (ref 0.3–1.2)
Total Protein: 5.8 g/dL — ABNORMAL LOW (ref 6.5–8.1)

## 2022-01-25 LAB — PHOSPHORUS: Phosphorus: 3 mg/dL (ref 2.5–4.6)

## 2022-01-25 LAB — TRIGLYCERIDES: Triglycerides: 44 mg/dL (ref <150)

## 2022-01-25 LAB — MAGNESIUM: Magnesium: 2 mg/dL (ref 1.7–2.4)

## 2022-01-25 MED ORDER — NEPRO/CARBSTEADY PO LIQD
237.0000 mL | Freq: Three times a day (TID) | ORAL | Status: DC
  Administered 2022-01-26 – 2022-01-27 (×5): 237 mL via ORAL

## 2022-01-25 MED ORDER — TRAVASOL 10 % IV SOLN
INTRAVENOUS | Status: AC
  Filled 2022-01-25: qty 907.2

## 2022-01-25 MED ORDER — ASCORBIC ACID 500 MG PO TABS
500.0000 mg | ORAL_TABLET | Freq: Two times a day (BID) | ORAL | Status: DC
  Administered 2022-01-25 – 2022-01-28 (×6): 500 mg via ORAL
  Filled 2022-01-25 (×6): qty 1

## 2022-01-25 MED ORDER — ZINC SULFATE 220 (50 ZN) MG PO CAPS: 220.0000 mg | ORAL_CAPSULE | Freq: Every day | ORAL | Status: DC

## 2022-01-25 NOTE — Progress Notes (Addendum)
Nutrition Follow-up  DOCUMENTATION CODES:   Non-severe (moderate) malnutrition in context of chronic illness  INTERVENTION:   -500 mg vitamin C BID -Nepro Shake po TID, each supplement provides 425 kcal and 19 grams protein  -Feeding assistance with meals -TPN management per pharmacy  NUTRITION DIAGNOSIS:   Moderate Malnutrition related to chronic illness (COPD) as evidenced by mild fat depletion, moderate fat depletion, mild muscle depletion, moderate muscle depletion.  Ongoing  GOAL:   Patient will meet greater than or equal to 90% of their needs  Progressing   MONITOR:   Labs, Weight trends, Skin, I & O's, Other (Comment) (TPN)  REASON FOR ASSESSMENT:   Ventilator    ASSESSMENT:   88 y/o female with h/o breast cancer s/p lumpectomy/chemo/XRT, CHF, CKD III, Afib, COPD, lung cancer, pacemaker, OVD, DDD, gout, GERD, HTN and COVID 19 (2021) who is admitted with acute hypoxic / hypercapnic respiratory failure secondary to suspected aspiration in the setting of possible CAP & acute HFpEF exacerbation, AKI and encephalopathy.  6/3- s/p PICC, TPN initiated 6/4- s/p BSE- advanced to dysphagia 1 diet with nectar thick liquids  Reviewed I/O's: -740 ml x 24 hours and +3 L since admission  UOP: 800 ml x 24 hours  Pt lethargic at time of visit. Spoke with pt sister at bedside. Per sister, pt is a little more alert today. She is unsure how pt ate at breakfast, but consumed 25% of dinner yesterday. Pt is not particularly fond of pureed diet and requires feeding assistance with meals. PTA pt had a good appetite, but pt ate very slowly and pretty much grazed throughout the day.    Case discussed with SLP; plan to re-eval for potential for diet upgrade.   Per pharmacy notes, pt receiving TPN at goal rate of 70 ml/hr, which provides 1830 kcals and 91 grams protein, meeting 100% of estimated kcal and protein needs. Pt started on TPN due to inability to place NGT.   Discussed  importance of good meal and supplement intake to promote healing. Voiced concerns over pt's ability to eat enough; pt sister amenable for pt to try supplements.   Labs reviewed: CBGS: 106-120 (inpatient orders for glycemic control are 0-9 units insulin aspart every 8 hours).    Diet Order:   Diet Order             DIET - DYS 1 Room service appropriate? No; Fluid consistency: Nectar Thick  Diet effective now                   EDUCATION NEEDS:   Not appropriate for education at this time  Skin:  Skin Assessment: Skin Integrity Issues: Skin Integrity Issues:: Stage II Stage II: rt coccyx, lt coccyx, lower medial sacrum  Last BM:  01/24/22  Height:   Ht Readings from Last 1 Encounters:  01/21/22 5' 7.01" (1.702 m)    Weight:   Wt Readings from Last 1 Encounters:  01/25/22 77.3 kg    Ideal Body Weight:  61.4 kg  BMI:  Body mass index is 26.68 kg/m.  Estimated Nutritional Needs:   Kcal:  5009-3818  Protein:  85-100 grams  Fluid:  > 1.7 L    Loistine Chance, RD, LDN, [place] Registered Dietitian II Certified Diabetes Care and Education Specialist Please refer to [facility] for RD and/or RD on-call/weekend/after hours pager

## 2022-01-25 NOTE — Progress Notes (Signed)
PROGRESS NOTE  Kristin [name] NFA:[id number] DOB: September 27, 1929 DOA: 01/16/2022 PCP: [name], MD  Brief History   88 year old female presenting to [facility] ED from home via EMS on 01/16/2022 with complaints of lethargy and weakness.  Per the patient's granddaughter bedside, [name], patient was in her normal state of health until earlier in the evening on 01/16/2022 when she picked up the phone but did not respond.  Patient's granddaughter lives next door and walked over to check on her finding her sitting at the kitchen table with her head on the table.  She was responsive and able to talk but very weak.  Only other thing family noticed was that the patient was sleeping more than usual.  No other complaints that family was aware of.  In the ED the patient was found to have multifocal patchy opacities on CXR. There was suspected to be a tumor in the right upper lobe with superimposed multifocal pneumonia. The patient then decompensated and required mechanical ventilation for protection of airway after she lost consciousness.  Echocardiogram was obtained that demonstrated EF was 50-55%. LV showed low normal function. There were no regional wall motion abnormalities. There is mild left ventricular hypertrophy and grade I diastolic dysfunction.   The right ventricular systolic function is moderately reduced. The RV is severely enlarged. Recommendation was for CTA chest once renal status resolved.   On 01/18/2022 the patient displayed seizure-like activity while on the ventilator. She was loaded with Keppra and neurology was consulted. EEG demonstrated moderate to severe diffuse encephalopathy of indeterminate etiology.  Oncology was consulted as the patient carries a known diagnosis of limited stage small cell lung CA. Dr. Rogue Bussing in his evaluated stated that the patient is not a candidate for chemotherapy. The patient has receive palliative radiation only. She has an overall poor prognosis. He  recommended CT head with and without contrast to rule out brain mets. This was performed on 01/20/2022. No abnormal pathology was seen.  The patient was also having AF with RVR. She has had a PPM placed. She is on a heparin drip.  She was extubated on 01/21/2022. The patient remained encephalopathic, but she was communicative. Sedating medications were avoided. The following day the patient seemed to be struggling off of the ventilator, and palliative care consult was obtained.  Palliative care discussed the patient with the family. The family stated that they wished to maintain the full scope of care. They wished to keep her a full code. They also stated that if necessary they would want her to have a PEG tube. She wanted to continue to live "at all costs".  The patient was initially evaluated by SLP. She was felt to be at high risk of aspiration. The recommendation was for NPO. The patient is currently receiving TPN for this reason. However, she was re-evaluated yesterday and was advanced to a dysphagia 1 diet.  Given the patient's lack of appetite for the pureed diet, and her malnourished status will continue TPN for now.  Consultants  PCCM Oncology Neurology  Procedures  Mechanical ventilation and extubation PICC line insertion TPN  Antibiotics   Anti-infectives (From admission, onward)    Start     Dose/Rate Route Frequency Ordered Stop   01/20/22 1800  levofloxacin (LEVAQUIN) IVPB 750 mg        750 mg 100 mL/hr over 90 Minutes Intravenous Every 48 hours 01/20/22 1447 01/22/22 1909   01/19/22 1800  Ampicillin-Sulbactam (UNASYN) 3 g in sodium chloride 0.9 % 100 mL IVPB  Status:  Discontinued        3 g 200 mL/hr over 30 Minutes Intravenous Every 6 hours 01/19/22 1427 01/20/22 1443   01/19/22 0600  vancomycin (VANCOREADY) IVPB 1250 mg/250 mL  Status:  Discontinued        1,250 mg 83.3 mL/hr over 180 Minutes Intravenous Every 48 hours 01/17/22 0508 01/17/22 1045   01/19/22 0600   vancomycin (VANCOREADY) IVPB 1500 mg/300 mL  Status:  Discontinued        1,500 mg 75 mL/hr over 240 Minutes Intravenous Every 48 hours 01/17/22 1045 01/18/22 1339   01/18/22 1500  Ampicillin-Sulbactam (UNASYN) 3 g in sodium chloride 0.9 % 100 mL IVPB  Status:  Discontinued        3 g 200 mL/hr over 30 Minutes Intravenous Every 12 hours 01/18/22 1339 01/19/22 1427   01/18/22 0100  cefTRIAXone (ROCEPHIN) 2 g in sodium chloride 0.9 % 100 mL IVPB  Status:  Discontinued        2 g 200 mL/hr over 30 Minutes Intravenous Every 24 hours 01/17/22 0121 01/17/22 0445   01/17/22 0545  piperacillin-tazobactam (ZOSYN) IVPB 3.375 g  Status:  Discontinued        3.375 g 12.5 mL/hr over 240 Minutes Intravenous Every 8 hours 01/17/22 0455 01/18/22 1339   01/17/22 0545  vancomycin (VANCOREADY) IVPB 1750 mg/350 mL        1,750 mg 87.5 mL/hr over 240 Minutes Intravenous  Once 01/17/22 0457 01/17/22 0929   01/17/22 0145  cefTRIAXone (ROCEPHIN) 1 g in sodium chloride 0.9 % 100 mL IVPB  Status:  Discontinued       Note to Pharmacy: Give following initial 1 gm dose to make total dose of 2 gm .   1 g 200 mL/hr over 30 Minutes Intravenous  Once 01/17/22 0136 01/17/22 0445   01/17/22 0130  azithromycin (ZITHROMAX) 500 mg in sodium chloride 0.9 % 250 mL IVPB  Status:  Discontinued        500 mg 250 mL/hr over 60 Minutes Intravenous Every 24 hours 01/17/22 0121 01/17/22 0445   01/17/22 0045  cefTRIAXone (ROCEPHIN) 1 g in sodium chloride 0.9 % 100 mL IVPB        1 g 200 mL/hr over 30 Minutes Intravenous  Once 01/17/22 0034 01/17/22 0155   01/17/22 0045  azithromycin (ZITHROMAX) 500 mg in sodium chloride 0.9 % 250 mL IVPB        500 mg 250 mL/hr over 60 Minutes Intravenous  Once 01/17/22 0034 01/17/22 0302      Subjective  The patient is resting comfortably. She is minimally interactive. No new complaints. Daughter is at bedside.  Objective   Vitals:  Vitals:   01/25/22 0726 01/25/22 1120  BP: 104/72 (!)  124/56  Pulse: (!) 59 (!) 58  Resp: 20 20  Temp: 97.6 F (36.4 C) 97.6 F (36.4 C)  SpO2: 94% 96%    Exam:  Constitutional:  The patient is awake but lethargic. She appears uncomfortable. Respiratory:  No increased work of breathing. No wheezes, rales, or rhonchi No tactile fremitus Cardiovascular:  Regular rate and rhythm No murmurs, ectopy, or gallups. No lateral PMI. No thrills. Abdomen:  Abdomen is soft, non-tender, non-distended No hernias, masses, or organomegaly Normoactive bowel sounds.  Musculoskeletal:  No cyanosis, clubbing, or edema Skin:  No rashes, lesions, ulcers palpation of skin: no induration or nodules Neurologic:  CN 2-12 intact Sensation all 4 extremities intact  I have personally reviewed the following:  Today's Data  Vitals  Lab Data  CMP CBC  Micro Data  Urine culture is positive for E. Faecalis Blood culture is positive for staph hominis Blood culture repeated on 01/18/2022 and has had no growth  Imaging  CT Head CXR Lower extremity doppler: No DVT  Cardiology Data  EKG Echocardiogram  Other Data    Scheduled Meds:  apixaban  2.5 mg Oral BID   vitamin C  500 mg Oral BID   aspirin  81 mg Oral Daily   chlorhexidine  15 mL Mouth Rinse BID   Chlorhexidine Gluconate Cloth  6 each Topical Daily   feeding supplement (NEPRO CARB STEADY)  237 mL Oral TID BM   insulin aspart  0-9 Units Subcutaneous Q8H   lidocaine  1 patch Transdermal Q24H   mouth rinse  15 mL Mouth Rinse q12n4p   melatonin  10 mg Oral QHS   pantoprazole (PROTONIX) IV  40 mg Intravenous Q24H   rOPINIRole  4 mg Oral QHS   sodium chloride flush  10-40 mL Intracatheter Q12H   Continuous Infusions:  sodium chloride Stopped (01/17/22 1742)   sodium chloride     TPN ADULT (ION) 50 mL/hr at 01/25/22 1540   TPN ADULT (ION)      Principal Problem:   Multifocal pneumonia Active Problems:   Acute metabolic encephalopathy/unresponsive   Acute on chronic  respiratory failure with hypoxia (HCC)   Chronic anticoagulation (Eliquis)   Acute renal failure superimposed on stage 3a chronic kidney disease (HCC)   Paroxysmal atrial fibrillation (HCC)   Malignant neoplasm of right upper lobe of lung (HCC)   Long term current use of opiate analgesic   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   Chronic HFpEF exacerbation -had mild Elevated Troponin secondary to suspected demand ischemia Chronic / Paroxysmal Atrial Fibrillation - Continuous cardiac monitoring  - Daily weights to assess volume status - Diurese with the use of IV lasix, as hemodynamics and renal function permit -Continue outpatient Eliquis   Acute Kidney Injury superimposed on CKD stage 3b  Baseline Cr: 1.18, Cr on admission:1.50 Creatinine today is 0.64.  - Avoid nephrotoxic and hypotension - Monitor electrolytes, creatinine, and volume status   Acute encephalopathy secondary to unknown etiology- resolved  -Stat CT head without contrast - f/u ammonia level -Consider EEG/MRI brain if mentation does not improve with oxygenation support -Neurochecks every 4 -Avoid sedating medications as able - supportive care  Dysphagia:  The patient was initially evaluated by SLP on 01/23/2022. She was felt to be at high risk of aspiration. The recommendation was for NPO. The patient is currently receiving TPN for this reason. However, she was re-evaluated yesterday and was advanced to a dysphagia 1 diet.  Given the patient's lack of  appetite for the pureed diet, and her malnourished status will continue TPN for now.  EMonia Pouch UTI:  The patient has completed antibiotic treatment.  Non-small cell Lung CA: Appreciate Dr. Aletha Halim assistance. The patient has completed a course of palliative radiation  for this. CT head with and without contrast has not shown any signs of brain mets.  I have seen and examined this patient myself. I have spent 36 minutes in her evaluation and care.  DVT prophylaxis: Eliquis Code Status: Full Code Family Communication: I have discussed the patient with her grand-daughter Erica. All questions answered to the best of my ability. Disposition Plan: tbd    Dakoda Laventure, DO [facility] Direct contact: see [email or web address]  7PM-7AM contact night coverage as above 01/25/2022, 4:21 PM  LOS: 7 days

## 2022-01-25 NOTE — Consult Note (Signed)
PHARMACY - TOTAL PARENTERAL NUTRITION CONSULT NOTE   Indication:  Prolonged NPO and inability to place enteral access for nutrition  Patient Measurements: Height: 5' 7.01" (170.2 cm) Weight: 77.3 kg (170 lb 6.7 oz) IBW/kg (Calculated) : 61.62   Body mass index is 26.68 kg/m.  Assessment:  Patient is a 88 y/o F with medical history including Afib on apixaban, breast cancer in 2004, HFpEF, COPD, gout, hepatitis C, HLD, HTN, NSTEMI, lung cancer who is admitted 5/27 with acute respiratory failure in setting of aspiration / acute encephalopathy. Patient was intubated 5/28 thru 6/1. SLP consulted and is recommending NPO except medications. Attempts to place NG tube at bedside have been unsuccessful. Further options for enteral access are being discussed. In the meantime, pharmacy has been consulted to initiate TPN to provide nutrition.  Glucose / Insulin: Last Hgb A1c was 6.1% on 11/05/21. Patient has been normoglycemic. 1 unit(s) of SSI required in the last 24 hours Electrolytes: No abnormalities at present. Potassium at LLN Renal: Scr < 1 Hepatic: LFTs within normal limits Intake / Output; MIVF: I&O + 3.0 L for the admission; UOP 0.3>0.4 mL/k/h GI Imaging: N/A GI Surgeries / Procedures: N/A  Central access: PICC placed 5/28 TPN start date: 6/3  Nutritional Goals: Goal TPN rate is 70 mL/hr (provides 90 g of protein and 1830 kcals per day)  RD Assessment: Estimated Needs Total Energy Estimated Needs: 1600-1800kcal/day Total Protein Estimated Needs: 80-90g/day Total Fluid Estimated Needs: 1.6-1.8L/day  Current Nutrition:  NPO  Plan:  --Advance TPN to 70 mL/hr at 1800 6/05 (Full goal rate) --Electrolytes in TPN: Na 105mEq/L, K 43mEq/L, Ca 34mEq/L, Mg 23mEq/L, and Phos 8mmol/L. Cl:Ac 1:1 K: 3.5>4 in response to KCL 20 mEq PO x1 dose 6/4; no further repletion today --Add standard MVI and trace elements to TPN, thiamine 100 mg x 3 days (day # 3 / 3) --Continue Sensitive q8h SSI and  adjust as needed; can consider discontinuation if patient tolerates full rate TPN x 48 hours without insulin requirement  --Monitor TPN labs on Mon/Thurs, daily until stable  Lorna Dibble 01/25/2022,8:21 AM

## 2022-01-25 NOTE — Progress Notes (Signed)
Physical Therapy Treatment Patient Details Name: Kristin [name] MRN: [id number] DOB: 1930-07-09 Today's Date: 01/25/2022   History of Present Illness 88 year old female presenting to [facility] ED from home via EMS on 01/16/2022 with complaints of lethargy and weakness.  Per the patient's granddaughter bedside, Kristin [name], patient was in her normal state of health until earlier in the evening on 01/16/2022 when she picked up the phone but did not respond.  Patient's granddaughter lives next door and walked over to check on her finding her sitting at the kitchen table with her head on the table.  She was responsive and able to talk but very weak.  Only other thing family noticed was that the patient was sleeping more than usual.  No other complaints that family was aware of. PMHx significant for atrial fibrillation, COPD, HLD, HTN, and gout.    PT Comments    Pt lethargic but followed most 1-step commands well with extra time and cuing.  Pt required extensive assistance with bed mobility tasks and transfers and was unable to advance either LE during amb attempt at the EOB.  Pt's static sitting balance was grossly improved compared to the prior session, however, and the pt reported no adverse symptoms during the session other then BLE pain.  Pt will benefit from PT services in a SNF setting upon discharge to safely address deficits listed in patient problem list for decreased caregiver assistance and eventual return to PLOF.    Recommendations for follow up therapy are one component of a multi-disciplinary discharge planning process, led by the attending physician.  Recommendations may be updated based on patient status, additional functional criteria and insurance authorization.  Follow Up Recommendations  Skilled nursing-short term rehab (<3 hours/day)     Assistance Recommended at Discharge    Patient can return home with the following Two people to help with walking and/or transfers;Two people to help  with bathing/dressing/bathroom;Direct supervision/assist for financial management;Direct supervision/assist for medications management;Assist for transportation;Help with stairs or ramp for entrance;Assistance with feeding;Assistance with cooking/housework   Equipment Recommendations  Other (comment) (TBD at next venue of care)    Recommendations for Other Services       Precautions / Restrictions Precautions Precautions: Fall Restrictions Weight Bearing Restrictions: No     Mobility  Bed Mobility Overal bed mobility: Needs Assistance Bed Mobility: Supine to Sit, Sit to Supine     Supine to sit: Mod assist, +2 for physical assistance Sit to supine: Mod assist, +2 for physical assistance   General bed mobility comments: Heavy assist for BLE and trunk control    Transfers Overall transfer level: Needs assistance Equipment used: Rolling walker (2 wheels) Transfers: Sit to/from Stand Sit to Stand: +2 physical assistance, Max assist           General transfer comment: Heavy assist to come to standing and to remain in standing; pt require max verbal and tactile cues for increased trunk flexion as well as hand and foot positioning    Ambulation/Gait               General Gait Details: Pt unable to advance either LE during amb attempt at EOB   Stairs             Wheelchair Mobility    Modified Rankin (Stroke Patients Only)       Balance Overall balance assessment: Needs assistance Sitting-balance support: Feet supported, Bilateral upper extremity supported Sitting balance-Leahy Scale: Good Sitting balance - Comments: Occasional min A for sitting stability  but mostly close SBA Postural control: Posterior lean   Standing balance-Leahy Scale: Zero Standing balance comment: Constant +2 assist to prevent LOB                            Cognition Arousal/Alertness: Lethargic Behavior During Therapy: WFL for tasks assessed/performed Overall  Cognitive Status: No family/caregiver present to determine baseline cognitive functioning                                          Exercises Total Joint Exercises Heel Slides: AAROM, Strengthening, Both, 5 reps Hip ABduction/ADduction: AAROM, Strengthening, Both, 10 reps Straight Leg Raises: AAROM, Strengthening, Both, 10 reps Long Arc Quad: AROM, AAROM, Strengthening, Both, 5 reps, 10 reps Knee Flexion: AROM, AAROM, Strengthening, Both, 5 reps, 10 reps Other Exercises Other Exercises: log roll training for decreased caregiver assistance with bed mobility tasks    General Comments        Pertinent Vitals/Pain Pain Assessment Pain Assessment: 0-10 Pain Score: 6  Pain Location: BLE's Pain Descriptors / Indicators: Grimacing, Sore Pain Intervention(s): Premedicated before session, Repositioned, Monitored during session    Home Living                          Prior Function            PT Goals (current goals can now be found in the care plan section) Progress towards PT goals: Not progressing toward goals - comment (limited by weakness)    Frequency    Min 2X/week      PT Plan Current plan remains appropriate    Co-evaluation              AM-PAC PT "6 Clicks" Mobility   Outcome Measure  Help needed turning from your back to your side while in a flat bed without using bedrails?: Total Help needed moving from lying on your back to sitting on the side of a flat bed without using bedrails?: Total Help needed moving to and from a bed to a chair (including a wheelchair)?: Total Help needed standing up from a chair using your arms (e.g., wheelchair or bedside chair)?: Total Help needed to walk in hospital room?: Total Help needed climbing 3-5 steps with a railing? : Total 6 Click Score: 6    End of Session Equipment Utilized During Treatment: Oxygen Activity Tolerance: Patient tolerated treatment well Patient left: in bed;with call  bell/phone within reach;with bed alarm set Nurse Communication: Mobility status PT Visit Diagnosis: Unsteadiness on feet (R26.81);Muscle weakness (generalized) (M62.81);Difficulty in walking, not elsewhere classified (R26.2)     Time: 7564-3329 PT Time Calculation (min) (ACUTE ONLY): 24 min  Charges:  $Therapeutic Exercise: 8-22 mins $Therapeutic Activity: 8-22 mins                     D. Scott Tarahji Ramthun PT, DPT 01/25/22, 4:06 PM

## 2022-01-25 NOTE — Plan of Care (Signed)
  Problem: Activity: Goal: Ability to tolerate increased activity will improve Outcome: Progressing   Problem: Clinical Measurements: Goal: Ability to maintain a body temperature in the normal range will improve Outcome: Progressing   Problem: Respiratory: Goal: Ability to maintain adequate ventilation will improve Outcome: Progressing Goal: Ability to maintain a clear airway will improve Outcome: Progressing   Problem: Education: Goal: Knowledge of General Education information will improve Description: Including pain rating scale, medication(s)/side effects and non-pharmacologic comfort measures Outcome: Progressing   Problem: Health Behavior/Discharge Planning: Goal: Ability to manage health-related needs will improve Outcome: Progressing   Problem: Clinical Measurements: Goal: Ability to maintain clinical measurements within normal limits will improve Outcome: Progressing Goal: Will remain free from infection Outcome: Progressing Goal: Diagnostic test results will improve Outcome: Progressing Goal: Respiratory complications will improve Outcome: Progressing Goal: Cardiovascular complication will be avoided Outcome: Progressing   Problem: Activity: Goal: Risk for activity intolerance will decrease Outcome: Progressing   Problem: Nutrition: Goal: Adequate nutrition will be maintained Outcome: Progressing   Problem: Coping: Goal: Level of anxiety will decrease Outcome: Progressing   Problem: Elimination: Goal: Will not experience complications related to bowel motility Outcome: Progressing Goal: Will not experience complications related to urinary retention Outcome: Progressing   Problem: Pain Managment: Goal: General experience of comfort will improve Outcome: Progressing   Problem: Safety: Goal: Ability to remain free from injury will improve Outcome: Progressing   Problem: Skin Integrity: Goal: Risk for impaired skin integrity will decrease Outcome:  Progressing   Problem: Education: Goal: Ability to describe self-care measures that may prevent or decrease complications (Diabetes Survival Skills Education) will improve Outcome: Progressing Goal: Individualized Educational Video(s) Outcome: Progressing   Problem: Coping: Goal: Ability to adjust to condition or change in health will improve Outcome: Progressing   Problem: Fluid Volume: Goal: Ability to maintain a balanced intake and output will improve Outcome: Progressing   Problem: Health Behavior/Discharge Planning: Goal: Ability to identify and utilize available resources and services will improve Outcome: Progressing Goal: Ability to manage health-related needs will improve Outcome: Progressing   Problem: Metabolic: Goal: Ability to maintain appropriate glucose levels will improve Outcome: Progressing   Problem: Nutritional: Goal: Maintenance of adequate nutrition will improve Outcome: Progressing Goal: Progress toward achieving an optimal weight will improve Outcome: Progressing   Problem: Skin Integrity: Goal: Risk for impaired skin integrity will decrease Outcome: Progressing   Problem: Tissue Perfusion: Goal: Adequacy of tissue perfusion will improve Outcome: Progressing

## 2022-01-26 ENCOUNTER — Inpatient Hospital Stay: Payer: Medicare HMO

## 2022-01-26 DIAGNOSIS — J189 Pneumonia, unspecified organism: Secondary | ICD-10-CM | POA: Diagnosis not present

## 2022-01-26 LAB — CBC WITH DIFFERENTIAL/PLATELET
Abs Immature Granulocytes: 0.04 10*3/uL (ref 0.00–0.07)
Basophils Absolute: 0 10*3/uL (ref 0.0–0.1)
Basophils Relative: 0 %
Eosinophils Absolute: 0.7 10*3/uL — ABNORMAL HIGH (ref 0.0–0.5)
Eosinophils Relative: 12 %
HCT: 30 % — ABNORMAL LOW (ref 36.0–46.0)
Hemoglobin: 9.3 g/dL — ABNORMAL LOW (ref 12.0–15.0)
Immature Granulocytes: 1 %
Lymphocytes Relative: 15 %
Lymphs Abs: 0.9 10*3/uL (ref 0.7–4.0)
MCH: 26.6 pg (ref 26.0–34.0)
MCHC: 31 g/dL (ref 30.0–36.0)
MCV: 85.7 fL (ref 80.0–100.0)
Monocytes Absolute: 0.5 10*3/uL (ref 0.1–1.0)
Monocytes Relative: 8 %
Neutro Abs: 4.1 10*3/uL (ref 1.7–7.7)
Neutrophils Relative %: 64 %
Platelets: 162 10*3/uL (ref 150–400)
RBC: 3.5 MIL/uL — ABNORMAL LOW (ref 3.87–5.11)
RDW: 19 % — ABNORMAL HIGH (ref 11.5–15.5)
WBC: 6.2 10*3/uL (ref 4.0–10.5)
nRBC: 0 % (ref 0.0–0.2)

## 2022-01-26 LAB — GLUCOSE, CAPILLARY
Glucose-Capillary: 113 mg/dL — ABNORMAL HIGH (ref 70–99)
Glucose-Capillary: 124 mg/dL — ABNORMAL HIGH (ref 70–99)
Glucose-Capillary: 128 mg/dL — ABNORMAL HIGH (ref 70–99)
Glucose-Capillary: 118 mg/dL — ABNORMAL HIGH (ref 70–99)

## 2022-01-26 MED ORDER — FUROSEMIDE 10 MG/ML IJ SOLN
20.0000 mg | Freq: Once | INTRAMUSCULAR | Status: AC
  Administered 2022-01-26: 20 mg via INTRAVENOUS
  Filled 2022-01-26: qty 4

## 2022-01-26 MED ORDER — TRAVASOL 10 % IV SOLN
INTRAVENOUS | Status: AC
  Filled 2022-01-26: qty 907.2

## 2022-01-26 NOTE — Progress Notes (Signed)
PT seen per NR request, and given PRN Duoneb Neb. Low SpO2 of 89-90% on 4L, O2 increased to 5L  with increase in SpO2 to 93%. Rhonchi/Exp Wheezes throughout, unchanged post Neb Tx. PT has dyspnea at rest, RN notified for provider follow up.

## 2022-01-26 NOTE — Progress Notes (Signed)
Came in to give patient's meds and when she awoke, noticed that she had increased breathing effort, wheezing and groaning with breaths. Checked oxygen and her saturations are 93%. Called respiratory and asked for breathing treatment to see if it will improved. Will continue to monitor.

## 2022-01-26 NOTE — Progress Notes (Signed)
Occupational Therapy Treatment Patient Details Name: Kristin [name] MRN: [id number] DOB: Jan 13, 1930 Today's Date: 01/26/2022   History of present illness 88 year old female presenting to [facility] ED from home via EMS on 01/16/2022 with complaints of lethargy and weakness.  Per the patient's granddaughter bedside, [name], patient was in her normal state of health until earlier in the evening on 01/16/2022 when she picked up the phone but did not respond.  Patient's granddaughter lives next door and walked over to check on her finding her sitting at the kitchen table with her head on the table.  She was responsive and able to talk but very weak.  Only other thing family noticed was that the patient was sleeping more than usual.  No other complaints that family was aware of. PMHx significant for atrial fibrillation, COPD, HLD, HTN, and gout.   OT comments  Chart reviewed, pt greeted in bed with sister present, agreeable to OT tx session. Pt is alert, oriented to self, place, grossly to situation; not oriented to date; Pt sister reports cognition is not at baseline. Tx session targeted progressing activity tolerance to improve safe performance in ADL completion. Improvements noted in bed mobility with supine>sit with MOD A with HOB raised, STS with MOD A +2, and step pivot transfers to bedside chair with MIN A +2.MAX A required for LB dressing. Vital signs stable throughout. Pt is left in bedside chair, NAD, all needs met. Discharge recommendation remains appropriate.    Recommendations for follow up therapy are one component of a multi-disciplinary discharge planning process, led by the attending physician.  Recommendations may be updated based on patient status, additional functional criteria and insurance authorization.    Follow Up Recommendations  Skilled nursing-short term rehab (<3 hours/day)    Assistance Recommended at Discharge Frequent or constant Supervision/Assistance  Patient can return home  with the following  A lot of help with walking and/or transfers;A lot of help with bathing/dressing/bathroom   Equipment Recommendations  Other (comment) (per next venue of care)    Recommendations for Other Services      Precautions / Restrictions Precautions Precautions: Fall Restrictions Weight Bearing Restrictions: No       Mobility Bed Mobility Overal bed mobility: Needs Assistance Bed Mobility: Supine to Sit     Supine to sit: Mod assist, HOB elevated          Transfers Overall transfer level: Needs assistance Equipment used: Rolling walker (2 wheels) Transfers: Sit to/from Stand Sit to Stand: Mod assist, +2 physical assistance           General transfer comment: step by step vcs for body mechanics     Balance Overall balance assessment: Needs assistance Sitting-balance support: Feet supported, Bilateral upper extremity supported Sitting balance-Leahy Scale: Good Sitting balance - Comments: seated at edge of bed for approx 10 minutes with close sup Postural control: Posterior lean Standing balance support: Reliant on assistive device for balance, Bilateral upper extremity supported Standing balance-Leahy Scale: Poor                             ADL either performed or assessed with clinical judgement   ADL Overall ADL's : Needs assistance/impaired Eating/Feeding: Moderate assistance;Sitting                   Lower Body Dressing: Maximal assistance   Toilet Transfer: Minimal assistance;Rolling walker (2 wheels);+2 for safety/equipment;+2 for physical assistance;Cueing for sequencing;Cueing for safety Toilet Transfer  Details (indicate cue type and reason): simulated step pivot to bedside chair Toileting- Clothing Manipulation and Hygiene: Maximal assistance              Extremity/Trunk Assessment              Vision       Perception     Praxis      Cognition Arousal/Alertness: Lethargic Behavior During Therapy:  Flat affect Overall Cognitive Status: Impaired/Different from baseline Area of Impairment: Orientation, Memory, Attention, Following commands, Awareness, Problem solving, Safety/judgement                 Orientation Level: Disoriented to, Time Current Attention Level: Sustained Memory: Decreased recall of precautions, Decreased short-term memory Following Commands: Follows one step commands with increased time   Awareness: Emergent Problem Solving: Slow processing, Decreased initiation, Difficulty sequencing, Requires verbal cues, Requires tactile cues General Comments: Sister reports pt not at baseline for cognition        Exercises Other Exercises Other Exercises: edu re: importance of mobility, participation in ADL tasks    Shoulder Instructions       General Comments BP at start of session, supine 135/63, HR 68, spo2 90% on 5L via [place]; At edge of bed HR 62, spo2 96%, 121/75 in chair: 137/82 at HR 64, spo296%    Pertinent Vitals/ Pain       Pain Assessment Pain Assessment: 0-10 Pain Score: 5  Pain Location: BLEs and lower back Pain Descriptors / Indicators: Grimacing, Sore Pain Intervention(s): Limited activity within patient's tolerance, Monitored during session, RN gave pain meds during session, Repositioned  Home Living                                          Prior Functioning/Environment              Frequency  Min 2X/week        Progress Toward Goals  OT Goals(current goals can now be found in the care plan section)  Progress towards OT goals: Progressing toward goals     Plan Discharge plan remains appropriate    Co-evaluation                 AM-PAC OT "6 Clicks" Daily Activity     Outcome Measure   Help from another person eating meals?: A Lot Help from another person taking care of personal grooming?: A Lot Help from another person toileting, which includes using toliet, bedpan, or urinal?: A Lot Help from  another person bathing (including washing, rinsing, drying)?: A Lot Help from another person to put on and taking off regular upper body clothing?: A Little Help from another person to put on and taking off regular lower body clothing?: A Lot 6 Click Score: 13    End of Session Equipment Utilized During Treatment: Rolling walker (2 wheels);Oxygen;Gait belt  OT Visit Diagnosis: Muscle weakness (generalized) (M62.81);Unsteadiness on feet (R26.81)   Activity Tolerance Patient tolerated treatment well   Patient Left in chair;with call bell/phone within reach;with chair alarm set;with family/visitor present;with nursing/sitter in room   Nurse Communication Mobility status        Time: 1610-9604 OT Time Calculation (min): 43 min  Charges: OT General Charges $OT Visit: 1 Visit OT Treatments $Self Care/Home Management : 8-22 mins $Therapeutic Activity: 23-37 mins  Shanon Payor, OTD OTR/L  01/26/22, 12:58 PM

## 2022-01-26 NOTE — Progress Notes (Addendum)
PROGRESS NOTE  Kristin [name] NOB:[id number] DOB: 1930/04/08 DOA: 01/16/2022 PCP: [name], MD  Brief History   88 year old female presenting to [facility] ED from home via EMS on 01/16/2022 with complaints of lethargy and weakness.  Per the patient's granddaughter bedside, [name], patient was in her normal state of health until earlier in the evening on 01/16/2022 when she picked up the phone but did not respond.  Patient's granddaughter lives next door and walked over to check on her finding her sitting at the kitchen table with her head on the table.  She was responsive and able to talk but very weak.  Only other thing family noticed was that the patient was sleeping more than usual.  No other complaints that family was aware of.  In the ED the patient was found to have multifocal patchy opacities on CXR. There was suspected to be a tumor in the right upper lobe with superimposed multifocal pneumonia. The patient then decompensated and required mechanical ventilation for protection of airway after she lost consciousness.  Echocardiogram was obtained that demonstrated EF was 50-55%. LV showed low normal function. There were no regional wall motion abnormalities. There is mild left ventricular hypertrophy and grade I diastolic dysfunction.   The right ventricular systolic function is moderately reduced. The RV is severely enlarged. Recommendation was for CTA chest once renal status resolved.   On 01/18/2022 the patient displayed seizure-like activity while on the ventilator. She was loaded with Keppra and neurology was consulted. EEG demonstrated moderate to severe diffuse encephalopathy of indeterminate etiology.  Oncology was consulted as the patient carries a known diagnosis of limited stage small cell lung CA. Dr. Rogue Bussing in his evaluated stated that the patient is not a candidate for chemotherapy. The patient has receive palliative radiation only. She has an overall poor prognosis. He  recommended CT head with and without contrast to rule out brain mets. This was performed on 01/20/2022. No abnormal pathology was seen.  The patient was also having AF with RVR. She has had a PPM placed. She is on a heparin drip.  She was extubated on 01/21/2022. The patient remained encephalopathic, but she was communicative. Sedating medications were avoided. The following day the patient seemed to be struggling off of the ventilator, and palliative care consult was obtained.  Palliative care discussed the patient with the family. The family stated that they wished to maintain the full scope of care. They wished to keep her a full code. They also stated that if necessary they would want her to have a PEG tube. She wanted to continue to live "at all costs".  The patient was initially evaluated by SLP. She was felt to be at high risk of aspiration. The recommendation was for NPO. The patient is currently receiving TPN for this reason. However, she was re-evaluated yesterday and was advanced to a dysphagia 1 diet.  Given the patient's lack of appetite for the pureed diet, and her malnourished status will continue TPN for now.  I have asked SLP to re-evaluate the patient to see if her diet may be advanced to one which is more palatable for her.  Consultants  PCCM Oncology Neurology Nutrition SLP  Procedures  Mechanical ventilation and extubation PICC line insertion TPN  Antibiotics   Anti-infectives (From admission, onward)    Start     Dose/Rate Route Frequency Ordered Stop   01/20/22 1800  levofloxacin (LEVAQUIN) IVPB 750 mg        750 mg 100 mL/hr over  90 Minutes Intravenous Every 48 hours 01/20/22 1447 01/22/22 1909   01/19/22 1800  Ampicillin-Sulbactam (UNASYN) 3 g in sodium chloride 0.9 % 100 mL IVPB  Status:  Discontinued        3 g 200 mL/hr over 30 Minutes Intravenous Every 6 hours 01/19/22 1427 01/20/22 1443   01/19/22 0600  vancomycin (VANCOREADY) IVPB 1250 mg/250 mL  Status:   Discontinued        1,250 mg 83.3 mL/hr over 180 Minutes Intravenous Every 48 hours 01/17/22 0508 01/17/22 1045   01/19/22 0600  vancomycin (VANCOREADY) IVPB 1500 mg/300 mL  Status:  Discontinued        1,500 mg 75 mL/hr over 240 Minutes Intravenous Every 48 hours 01/17/22 1045 01/18/22 1339   01/18/22 1500  Ampicillin-Sulbactam (UNASYN) 3 g in sodium chloride 0.9 % 100 mL IVPB  Status:  Discontinued        3 g 200 mL/hr over 30 Minutes Intravenous Every 12 hours 01/18/22 1339 01/19/22 1427   01/18/22 0100  cefTRIAXone (ROCEPHIN) 2 g in sodium chloride 0.9 % 100 mL IVPB  Status:  Discontinued        2 g 200 mL/hr over 30 Minutes Intravenous Every 24 hours 01/17/22 0121 01/17/22 0445   01/17/22 0545  piperacillin-tazobactam (ZOSYN) IVPB 3.375 g  Status:  Discontinued        3.375 g 12.5 mL/hr over 240 Minutes Intravenous Every 8 hours 01/17/22 0455 01/18/22 1339   01/17/22 0545  vancomycin (VANCOREADY) IVPB 1750 mg/350 mL        1,750 mg 87.5 mL/hr over 240 Minutes Intravenous  Once 01/17/22 0457 01/17/22 0929   01/17/22 0145  cefTRIAXone (ROCEPHIN) 1 g in sodium chloride 0.9 % 100 mL IVPB  Status:  Discontinued       Note to Pharmacy: Give following initial 1 gm dose to make total dose of 2 gm .   1 g 200 mL/hr over 30 Minutes Intravenous  Once 01/17/22 0136 01/17/22 0445   01/17/22 0130  azithromycin (ZITHROMAX) 500 mg in sodium chloride 0.9 % 250 mL IVPB  Status:  Discontinued        500 mg 250 mL/hr over 60 Minutes Intravenous Every 24 hours 01/17/22 0121 01/17/22 0445   01/17/22 0045  cefTRIAXone (ROCEPHIN) 1 g in sodium chloride 0.9 % 100 mL IVPB        1 g 200 mL/hr over 30 Minutes Intravenous  Once 01/17/22 0034 01/17/22 0155   01/17/22 0045  azithromycin (ZITHROMAX) 500 mg in sodium chloride 0.9 % 250 mL IVPB        500 mg 250 mL/hr over 60 Minutes Intravenous  Once 01/17/22 0034 01/17/22 0302      Subjective  The patient is resting comfortably. She is a little more  interactive today. No new complaints. Family is at bedside.  Objective   Vitals:  Vitals:   01/26/22 1212 01/26/22 1547  BP: 126/61 114/72  Pulse: 62 66  Resp: 16 18  Temp: 98.1 F (36.7 C) 98.1 F (36.7 C)  SpO2: 100% 95%    Exam:  Constitutional:  The patient is awake, alert, and oriented x 3. No acute distress. Respiratory:  No increased work of breathing. No wheezes, rales, or rhonchi No tactile fremitus Cardiovascular:  Regular rate and rhythm No murmurs, ectopy, or gallups. No lateral PMI. No thrills. Abdomen:  Abdomen is soft, non-tender, non-distended No hernias, masses, or organomegaly Normoactive bowel sounds.  Musculoskeletal:  No cyanosis, clubbing, or edema  Skin:  No rashes, lesions, ulcers palpation of skin: no induration or nodules Neurologic:  CN 2-12 intact Sensation all 4 extremities intact  I have personally reviewed the following:   Today's Data  Vitals  Lab Data   CBC  Micro Data  Urine culture is positive for E. Faecalis Blood culture is positive for staph hominis Blood culture repeated on 01/18/2022 and has had no growth  Imaging  CT Head CXR Lower extremity doppler: No DVT  Cardiology Data  EKG Echocardiogram  Other Data    Scheduled Meds:  apixaban  2.5 mg Oral BID   vitamin C  500 mg Oral BID   aspirin  81 mg Oral Daily   chlorhexidine  15 mL Mouth Rinse BID   Chlorhexidine Gluconate Cloth  6 each Topical Daily   feeding supplement (NEPRO CARB STEADY)  237 mL Oral TID BM   insulin aspart  0-9 Units Subcutaneous Q8H   lidocaine  1 patch Transdermal Q24H   mouth rinse  15 mL Mouth Rinse q12n4p   melatonin  10 mg Oral QHS   pantoprazole (PROTONIX) IV  40 mg Intravenous Q24H   rOPINIRole  4 mg Oral QHS   sodium chloride flush  10-40 mL Intracatheter Q12H   Continuous Infusions:  sodium chloride Stopped (01/17/22 1742)   sodium chloride     TPN ADULT (ION) 70 mL/hr at 01/25/22 1805   TPN ADULT (ION)       Principal Problem:   Multifocal pneumonia Active Problems:   Acute metabolic encephalopathy/unresponsive   Acute on chronic respiratory failure with hypoxia (HCC)   Chronic anticoagulation (Eliquis)   Acute renal failure superimposed on stage 3a chronic kidney disease (HCC)   Paroxysmal atrial fibrillation (HCC)   Malignant neoplasm of right upper lobe of lung (HCC)   Long term current use of opiate analgesic   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   Acute on Chronic HFpEF exacerbation, POA -had mild Elevated Troponin secondary to suspected demand ischemia Chronic / Paroxysmal Atrial Fibrillation - Continuous cardiac monitoring  - Daily weights to assess volume status - Diurese with the use of IV lasix, as hemodynamics and renal function permit -Continue outpatient Eliquis   Acute Kidney Injury superimposed on CKD stage 3b  Baseline Cr: 1.18, Cr on admission:1.50 Creatinine was 0.64 on 01/25/2022. - Avoid nephrotoxic and hypotension - Monitor electrolytes, creatinine, and volume status   Acute encephalopathy secondary to unknown etiology- resolved  -Stat CT head without contrast - f/u ammonia level -Consider EEG/MRI brain if mentation does not improve with oxygenation support -Neurochecks every 4 -Avoid sedating medications as able - supportive care  Dysphagia:  The patient was initially evaluated by SLP on 01/23/2022. She was felt to be at high risk of aspiration. The recommendation was for NPO. The  patient is currently receiving TPN for this reason. However, she was re-evaluated yesterday and was advanced to a dysphagia 1 diet.  Given the patient's lack of appetite for the pureed diet, and  her malnourished status will continue TPN for now. I have asked SLP to re-evaluate to see if she could be advanced to a more palatable diet.  EMonia Pouch UTI:  The patient has completed antibiotic treatment.  Non-small cell Lung CA: Appreciate Dr. Aletha Halim assistance. The patient has completed a course of palliative radiation for this. CT head with and without contrast has not shown any signs of brain mets.  I have seen and examined this patient myself. I have spent 32 minutes in her evaluation and care.  DVT prophylaxis: Eliquis Code Status: Full Code Family Communication: I have discussed the patient with her grand-daughter Erica. All questions answered to the best of my ability. Disposition Plan: tbd    Stepahnie Campo, DO [facility] Direct contact: see [email or web address]  7PM-7AM contact night coverage as above 01/26/2022, 4:20 PM  LOS: 7 days

## 2022-01-26 NOTE — Plan of Care (Signed)
Patient continues to be confused. Chest X-ray has worsened.   Problem: Clinical Measurements: Goal: Ability to maintain a body temperature in the normal range will improve Outcome: Progressing   Problem: Clinical Measurements: Goal: Will remain free from infection Outcome: Progressing Goal: Cardiovascular complication will be avoided Outcome: Progressing   Problem: Nutrition: Goal: Adequate nutrition will be maintained Outcome: Progressing   Problem: Elimination: Goal: Will not experience complications related to bowel motility Outcome: Progressing Goal: Will not experience complications related to urinary retention Outcome: Progressing   Problem: Pain Managment: Goal: General experience of comfort will improve Outcome: Progressing   Problem: Safety: Goal: Ability to remain free from injury will improve Outcome: Progressing   Problem: Skin Integrity: Goal: Risk for impaired skin integrity will decrease Outcome: Progressing   Problem: Metabolic: Goal: Ability to maintain appropriate glucose levels will improve Outcome: Progressing   Problem: Nutritional: Goal: Maintenance of adequate nutrition will improve Outcome: Progressing Goal: Progress toward achieving an optimal weight will improve Outcome: Progressing   Problem: Skin Integrity: Goal: Risk for impaired skin integrity will decrease Outcome: Progressing   Problem: Tissue Perfusion: Goal: Adequacy of tissue perfusion will improve Outcome: Progressing

## 2022-01-26 NOTE — Consult Note (Signed)
PHARMACY - TOTAL PARENTERAL NUTRITION CONSULT NOTE   Indication:  Prolonged NPO and inability to place enteral access for nutrition  Patient Measurements: Height: 5' 7.01" (170.2 cm) Weight: 77.3 kg (170 lb 6.7 oz) IBW/kg (Calculated) : 61.62   Body mass index is 26.68 kg/m.  Assessment:  Patient is a 88 y/o F with medical history including Afib on apixaban, breast cancer in 2004, HFpEF, COPD, gout, hepatitis C, HLD, HTN, NSTEMI, lung cancer who is admitted 5/27 with acute respiratory failure in setting of aspiration / acute encephalopathy. Patient was intubated 5/28 thru 6/1. SLP consulted and is recommending NPO except medications. Attempts to place NG tube at bedside have been unsuccessful. Further options for enteral access are being discussed. In the meantime, pharmacy has been consulted to initiate TPN to provide nutrition.  Glucose / Insulin: Last Hgb A1c was 6.1% on 11/05/21. Patient has been normoglycemic. 1 unit(s) of SSI required in the last 24 hours Electrolytes: No abnormalities at present. Potassium at LLN Renal: Scr < 1 Hepatic: LFTs within normal limits Intake / Output; MIVF: I&O + 3.0 L for the admission; UOP 0.3>0.4 mL/k/h GI Imaging: N/A GI Surgeries / Procedures: N/A  Central access: PICC placed 5/28 TPN start date: 6/3  Nutritional Goals: Goal TPN rate is 70 mL/hr (provides 90 g of protein and 1830 kcals per day)  RD Assessment: Estimated Needs Total Energy Estimated Needs: 1750-1950 Total Protein Estimated Needs: 85-100 grams Total Fluid Estimated Needs: > 1.7 L  Current Nutrition:  NPO  Plan:  --Continue TPN at 70 mL/hr at 1800 6/05 (Full goal rate) --Electrolytes in TPN: Na 17mEq/L, K 66mEq/L, Ca 33mEq/L, Mg 27mEq/L, and Phos 53mmol/L. Cl:Ac 1:1 --Only CBC done today. No electrolytes to replete --Add standard MVI and trace elements to TPN --Remove thiamine from TPN (3/ day completed) --Continue Sensitive q8h SSI and adjust as needed; can consider  discontinuation if patient tolerates full rate TPN x 48 hours without insulin requirement  --Monitor TPN labs on Mon/Thurs, daily until stable  Yuvraj Pfeifer Rodriguez-Guzman PharmD, BCPS 01/26/2022 7:31 AM

## 2022-01-27 ENCOUNTER — Ambulatory Visit: Payer: Medicare HMO | Admitting: Family

## 2022-01-27 ENCOUNTER — Inpatient Hospital Stay: Payer: Medicare HMO

## 2022-01-27 DIAGNOSIS — J189 Pneumonia, unspecified organism: Secondary | ICD-10-CM | POA: Diagnosis not present

## 2022-01-27 LAB — BASIC METABOLIC PANEL
Anion gap: 3 — ABNORMAL LOW (ref 5–15)
BUN: 32 mg/dL — ABNORMAL HIGH (ref 8–23)
CO2: 25 mmol/L (ref 22–32)
Calcium: 9.5 mg/dL (ref 8.9–10.3)
Chloride: 111 mmol/L (ref 98–111)
Creatinine, Ser: 0.74 mg/dL (ref 0.44–1.00)
GFR, Estimated: >60 mL/min (ref >60)
Glucose, Bld: 132 mg/dL — ABNORMAL HIGH (ref 70–99)
Potassium: 4.6 mmol/L (ref 3.5–5.1)
Sodium: 139 mmol/L (ref 135–145)

## 2022-01-27 LAB — URINALYSIS, ROUTINE W REFLEX MICROSCOPIC
Bilirubin Urine: NEGATIVE
Glucose, UA: 50 mg/dL — AB
Ketones, ur: NEGATIVE mg/dL
Nitrite: NEGATIVE
Protein, ur: 30 mg/dL — AB
RBC / HPF: >50 RBC/hpf — ABNORMAL HIGH (ref 0–5)
Specific Gravity, Urine: 1.016 (ref 1.005–1.030)
WBC, UA: >50 WBC/hpf — ABNORMAL HIGH (ref 0–5)
pH: 5 (ref 5.0–8.0)

## 2022-01-27 LAB — GLUCOSE, CAPILLARY
Glucose-Capillary: 110 mg/dL — ABNORMAL HIGH (ref 70–99)
Glucose-Capillary: 121 mg/dL — ABNORMAL HIGH (ref 70–99)
Glucose-Capillary: 129 mg/dL — ABNORMAL HIGH (ref 70–99)
Glucose-Capillary: 131 mg/dL — ABNORMAL HIGH (ref 70–99)
Glucose-Capillary: 131 mg/dL — ABNORMAL HIGH (ref 70–99)
Glucose-Capillary: 133 mg/dL — ABNORMAL HIGH (ref 70–99)

## 2022-01-27 LAB — CBC WITH DIFFERENTIAL/PLATELET
Abs Immature Granulocytes: 0.05 10*3/uL (ref 0.00–0.07)
Basophils Absolute: 0.1 10*3/uL (ref 0.0–0.1)
Basophils Relative: 1 %
Eosinophils Absolute: 0.8 10*3/uL — ABNORMAL HIGH (ref 0.0–0.5)
Eosinophils Relative: 12 %
HCT: 30.2 % — ABNORMAL LOW (ref 36.0–46.0)
Hemoglobin: 9.5 g/dL — ABNORMAL LOW (ref 12.0–15.0)
Immature Granulocytes: 1 %
Lymphocytes Relative: 13 %
Lymphs Abs: 0.9 10*3/uL (ref 0.7–4.0)
MCH: 27.1 pg (ref 26.0–34.0)
MCHC: 31.5 g/dL (ref 30.0–36.0)
MCV: 86 fL (ref 80.0–100.0)
Monocytes Absolute: 0.5 10*3/uL (ref 0.1–1.0)
Monocytes Relative: 7 %
Neutro Abs: 4.6 10*3/uL (ref 1.7–7.7)
Neutrophils Relative %: 66 %
Platelets: 182 10*3/uL (ref 150–400)
RBC: 3.51 MIL/uL — ABNORMAL LOW (ref 3.87–5.11)
RDW: 19 % — ABNORMAL HIGH (ref 11.5–15.5)
WBC: 6.8 10*3/uL (ref 4.0–10.5)
nRBC: 0 % (ref 0.0–0.2)

## 2022-01-27 MED ORDER — MOMETASONE FURO-FORMOTEROL FUM 100-5 MCG/ACT IN AERO
2.0000 | INHALATION_SPRAY | Freq: Two times a day (BID) | RESPIRATORY_TRACT | Status: DC
  Administered 2022-01-27 – 2022-01-28 (×3): 2 via RESPIRATORY_TRACT
  Filled 2022-01-27: qty 8.8

## 2022-01-27 MED ORDER — FUROSEMIDE 10 MG/ML IJ SOLN
40.0000 mg | Freq: Once | INTRAMUSCULAR | Status: AC
Start: 2022-01-27 — End: 2022-01-27
  Administered 2022-01-27: 40 mg via INTRAVENOUS
  Filled 2022-01-27: qty 4

## 2022-01-27 MED ORDER — ALBUTEROL SULFATE (2.5 MG/3ML) 0.083% IN NEBU: 2.5000 mg | INHALATION_SOLUTION | RESPIRATORY_TRACT | Status: DC | PRN

## 2022-01-27 MED ORDER — IPRATROPIUM-ALBUTEROL 0.5-2.5 (3) MG/3ML IN SOLN
3.0000 mL | Freq: Three times a day (TID) | RESPIRATORY_TRACT | Status: DC
  Administered 2022-01-27 – 2022-01-29 (×5): 3 mL via RESPIRATORY_TRACT
  Filled 2022-01-27 (×5): qty 3

## 2022-01-27 MED ORDER — TRAVASOL 10 % IV SOLN
INTRAVENOUS | Status: AC
  Filled 2022-01-27: qty 907.2

## 2022-01-27 MED ORDER — UMECLIDINIUM BROMIDE 62.5 MCG/ACT IN AEPB
1.0000 | INHALATION_SPRAY | Freq: Every day | RESPIRATORY_TRACT | Status: DC
  Administered 2022-01-28: 1 via RESPIRATORY_TRACT
  Filled 2022-01-27: qty 7

## 2022-01-27 MED ORDER — PANTOPRAZOLE SODIUM 40 MG PO TBEC
40.0000 mg | DELAYED_RELEASE_TABLET | Freq: Every day | ORAL | Status: DC
  Administered 2022-01-27 – 2022-01-28 (×2): 40 mg via ORAL
  Filled 2022-01-27 (×2): qty 1

## 2022-01-27 NOTE — Consult Note (Signed)
PHARMACY - TOTAL PARENTERAL NUTRITION CONSULT NOTE   Indication:  Prolonged NPO and inability to place enteral access for nutrition  Patient Measurements: Height: 5' 7.01" (170.2 cm) Weight: 77 kg (169 lb 12.1 oz) IBW/kg (Calculated) : 61.62   Body mass index is 26.58 kg/m.  Assessment:  Patient is a 88 y/o F with medical history including Afib on apixaban, breast cancer in 2004, HFpEF, COPD, gout, hepatitis C, HLD, HTN, NSTEMI, lung cancer who is admitted 5/27 with acute respiratory failure in setting of aspiration / acute encephalopathy. Patient was intubated 5/28 thru 6/1. SLP consulted and is recommending NPO except medications. Attempts to place NG tube at bedside have been unsuccessful. Further options for enteral access are being discussed. In the meantime, pharmacy has been consulted to initiate TPN to provide nutrition.  Glucose / Insulin: Last Hgb A1c was 6.1% on 11/05/21. Patient has been normoglycemic. 2 unit(s) of SSI required in the last 24 hours Electrolytes: No abnormalities at present. Renal: Scr < 1 Hepatic: LFTs within normal limits Intake / Output; MIVF: I&O + 2.2 L for the admission; UOP 0.4>1.3 mL/k/h GI Imaging: N/A GI Surgeries / Procedures: N/A  Central access: PICC placed 5/28 TPN start date: 6/3  Nutritional Goals: Goal TPN rate is 70 mL/hr (provides 90 g of protein and 1830 kcals per day)  RD Assessment: Estimated Needs Total Energy Estimated Needs: 1750-1950 Total Protein Estimated Needs: 85-100 grams Total Fluid Estimated Needs: > 1.7 L  Current Nutrition:  NPO  Plan:  --Continue TPN at 70 mL/hr at 1800 6/05 (Full goal rate) --Electrolytes in TPN: Na 46mEq/L, K 84mEq/L, Ca 44mEq/L, Mg 54mEq/L, and Phos 58mmol/L. Cl:Ac 1:1 --Add standard MVI and trace elements to TPN --Continue Sensitive q8h SSI and adjust as needed; can consider discontinuation if patient tolerates full rate TPN x 48 hours without insulin requirement  --Monitor TPN labs on  Mon/Thurs, daily until stable  Darnelle Bos, PharmD 01/27/2022 9:32 AM

## 2022-01-27 NOTE — Progress Notes (Addendum)
Modified Barium Swallow Progress Note  Patient Details  Name: Kristin [name] MRN: [id number] Date of Birth: 1930/03/05  Today's Date: 01/27/2022  Modified Barium Swallow completed.  Full report located under Chart Review in the Imaging Section.  Brief recommendations include the following:  Clinical Impression   Upon arrival to fluoro suite, patient in MBSS chair, set up by radiology/transport staff and ready for MBSS.  Pt presents with altered mentation making it difficult for pt to follow any directions or sustain attention to tasks. Pt was not appropriately responsive to spoon at her lips and allowed spoonful of thin liquids to dribble out of her mouth. A cup was placed in pt's hand to promote more functional self-feeding response. With maximal assistnace, pt able to bring cup to her mouth for a sip. Pt presents with discoordinated timing of apnea prior to swallow initiation that ultimately results in pt desating to 73% on 5L via [place]. SLP called pt's nurse for assistance with de-sat.   During nursing assessment, noted that O2 tubing not fully connected to wall oxygen.  Reconnected to ensure correct O2 flow, but still unable to adequately recover sats for continuation of safe PO consumption per MD protocol.    At this time, pt has increased risk of respiratory compromise d/t inability sustain safe O2 during consumption. This appears directly related to discoordinated apnea and overall mentation. It is also concerning that most recent chest x-ray noted cardiomegaly with suspected trace pleural effusions. Suspect  superimposed mild interstitial edema on underlying chronic lung disease and heterogeneous bilateral airspace opacities appear slightly worsened in the right upper lobe and right base and may reflect atelectasis or pneumonia. At this time recommend NPO status with reconsult to ST services should pt's oxygen requirements decrease.   Swallow Evaluation Recommendations       SLP Diet  Recommendations: NPO       Medication Administration: Via alternative means               Oral Care Recommendations: Oral care QID       Jinnie Onley B. Rutherford Nail, M.S., CCC-SLP, [place] Pathologist Certified Brain Injury [place]  [facility] [phone] Ascom [phone] Fax [phone]  Layaan Mott Rutherford Nail 01/27/2022,4:32 PM

## 2022-01-27 NOTE — Progress Notes (Signed)
RN notified at approximately 1330 that Pt had desaturated while in CT department for modified barium swallow test. Upon assessment in procedural area, pt did no appear to be in distress, but saturation reading on dynamap was 73%. While attempting to increase oxygen flow rate, author noted the cannula was not connected to wall oxygen. Upon reattaching to oxygen, Pt's saturations remained unchanged until author warmed Pt's finger which at that point saturation was 94% until Pt moved hand. Staff was unable from that point until she arrived back on unit to obtain saturation reading above 80%. Since being back in room on 4L nasal cannula, Pt has not desaturated below 92% while being on a continuous pulse oximeter, even including while Pt received a bed bath and had sacral foam placed. Will continue to monitor.

## 2022-01-27 NOTE — Plan of Care (Signed)
  Problem: Activity: Goal: Ability to tolerate increased activity will improve Outcome: Progressing   Problem: Clinical Measurements: Goal: Ability to maintain a body temperature in the normal range will improve Outcome: Progressing   Problem: Respiratory: Goal: Ability to maintain adequate ventilation will improve Outcome: Progressing Goal: Ability to maintain a clear airway will improve Outcome: Progressing   Problem: Education: Goal: Knowledge of General Education information will improve Description: Including pain rating scale, medication(s)/side effects and non-pharmacologic comfort measures Outcome: Progressing   Problem: Health Behavior/Discharge Planning: Goal: Ability to manage health-related needs will improve Outcome: Progressing   Problem: Clinical Measurements: Goal: Ability to maintain clinical measurements within normal limits will improve Outcome: Progressing Goal: Will remain free from infection Outcome: Progressing Goal: Diagnostic test results will improve Outcome: Progressing Goal: Respiratory complications will improve Outcome: Progressing Goal: Cardiovascular complication will be avoided Outcome: Progressing   Problem: Activity: Goal: Risk for activity intolerance will decrease Outcome: Progressing   Problem: Nutrition: Goal: Adequate nutrition will be maintained Outcome: Progressing   Problem: Coping: Goal: Level of anxiety will decrease Outcome: Progressing   Problem: Elimination: Goal: Will not experience complications related to bowel motility Outcome: Progressing Goal: Will not experience complications related to urinary retention Outcome: Progressing   Problem: Pain Managment: Goal: General experience of comfort will improve Outcome: Progressing   Problem: Safety: Goal: Ability to remain free from injury will improve Outcome: Progressing   Problem: Skin Integrity: Goal: Risk for impaired skin integrity will decrease Outcome:  Progressing   Problem: Education: Goal: Ability to describe self-care measures that may prevent or decrease complications (Diabetes Survival Skills Education) will improve Outcome: Progressing Goal: Individualized Educational Video(s) Outcome: Progressing   Problem: Coping: Goal: Ability to adjust to condition or change in health will improve Outcome: Progressing   Problem: Fluid Volume: Goal: Ability to maintain a balanced intake and output will improve Outcome: Progressing   Problem: Health Behavior/Discharge Planning: Goal: Ability to identify and utilize available resources and services will improve Outcome: Progressing Goal: Ability to manage health-related needs will improve Outcome: Progressing   Problem: Metabolic: Goal: Ability to maintain appropriate glucose levels will improve Outcome: Progressing   Problem: Nutritional: Goal: Maintenance of adequate nutrition will improve Outcome: Progressing Goal: Progress toward achieving an optimal weight will improve Outcome: Progressing   Problem: Skin Integrity: Goal: Risk for impaired skin integrity will decrease Outcome: Progressing   Problem: Tissue Perfusion: Goal: Adequacy of tissue perfusion will improve Outcome: Progressing

## 2022-01-27 NOTE — Progress Notes (Signed)
PROGRESS NOTE    Kristin [name]  WVP:[id number] DOB: March 17, 1930 DOA: 01/16/2022  PCP: [name], MD   Brief Narrative:  This 88 years old female with PMH significant for COPD, chronic diastolic CHF, lung cancer, CKD stage IIIa presented in the ED with c/o: lethargy and weakness.  Patient was sleeping more than usual, In the ED patient was found to have multifocal patchy opacities on chest x-ray,  there was suspected tumor in the right upper lobe with superimposed multifocal pneumonia.  Patient decompensated and required mechanical ventilation for airway protection.  Patient displayed seizure-like activity while on the ventilator.  She was loaded with Keppra and neurology was consulted.  EEG showed diffuse encephalopathy of indeterminate etiology.  Oncology was also consulted as patient carries a known diagnosis of small cell lung cancer.  As per Dr. Rogue Bussing patient is not a candidate for chemotherapy.  Patient has overall poor prognosis.  CT head and neck was negative for any brain mets.  Patient also had atrial fibrillation with RVR, started on heparin drip.  She was extubated on 01/21/2022.  Patient overall was less communicative,  sedating medications were discontinued.  Palliative care was consulted, family wished to maintain full scope of care.  Family is also agreeable to the PEG tube.  Speech and swallow felt that patient is at high risk for aspiration recommendation was NPO.  Patient is currently getting TPN for this reason.  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 to suspected aspiration in the setting of possible CAP and acute HFpEF exacerbation. Continue supplemental oxygen, saturating 95% on 5 L.  Acute on chronic HFpEF exacerbation,POA: Patient has mildly elevated troponin sec. to suspected demand ischemia. Continue cardiac monitoring. Daily weight, intake output charting. Continue IV Lasix diuresis.  Paroxysmal atrial fibrillation: Continue cardiac monitoring.   Heart rate is controlled, continue Eliquis.  Acute kidney injury on CKD stage IIIb: Baseline serum creatinine 1.18, creatinine on admission 1.50. Avoid nephrotoxic medications and hypotension Serum creatinine improved and back to normal.  Acute encephalopathy unknown etiology> resolved. CT head without contrast : No brain mets noted. Consider EEG /MRI brain if mentation does not improve with oxygenation support. Avoid sedating medications as able.  Dysphagia: Patient was initially evaluated by speech on 01/23/22. She was felt to be at high risk for aspiration.  Recommendation was for NPO. Patient is currently receiving TPN for this reason.  She She was reevaluated by speech yesterday and advanced to dysphagia 1 diet.  Gabriel Carina UTI: Patient has completed antibiotics treatment.  Non-small cell lung CA: Appreciate Dr. Aletha Halim assistance. Patient has completed course of palliative radiation. CT head without contrast shows no any signs of brain mets.   DVT prophylaxis:Eliquis Code Status: Full code. Family Communication:No family at bed side. Disposition Plan:  Status is: Inpatient Remains inpatient appropriate because:   Admitted for  acute encephalopathy which has been resolved. Completed antibiotics for pneumonia and UTI.  Patient is now getting TPN for dysphagia and poor nutrition.   Consultants:  PCCM Oncology Neurology  speech  Procedures: MRI, Mechanical ventilation and extubation, PICC line, TPN Antimicrobials:  Anti-infectives (From admission, onward)    Start     Dose/Rate Route Frequency Ordered Stop   01/20/22 1800  levofloxacin (LEVAQUIN) IVPB 750 mg        750 mg 100 mL/hr over 90 Minutes Intravenous Every 48 hours 01/20/22 1447 01/22/22 1909   01/19/22 1800  Ampicillin-Sulbactam (UNASYN) 3 g in sodium chloride 0.9 % 100 mL IVPB  Status:  Discontinued        3 g 200 mL/hr over 30 Minutes Intravenous Every 6 hours 01/19/22 1427 01/20/22 1443   01/19/22 0600  vancomycin (VANCOREADY) IVPB 1250 mg/250 mL  Status:  Discontinued        1,250 mg 83.3 mL/hr over 180 Minutes Intravenous Every 48 hours 01/17/22 0508 01/17/22 1045   01/19/22 0600  vancomycin (VANCOREADY) IVPB 1500 mg/300 mL  Status:  Discontinued        1,500 mg 75 mL/hr over 240 Minutes Intravenous Every 48 hours 01/17/22 1045 01/18/22 1339   01/18/22 1500  Ampicillin-Sulbactam (UNASYN) 3 g in sodium chloride 0.9 % 100 mL IVPB  Status:  Discontinued        3 g 200 mL/hr over 30 Minutes Intravenous Every 12 hours 01/18/22 1339 01/19/22 1427   01/18/22 0100  cefTRIAXone (ROCEPHIN) 2 g in sodium chloride 0.9 % 100 mL IVPB  Status:  Discontinued        2 g 200 mL/hr over 30 Minutes Intravenous Every 24 hours 01/17/22 0121 01/17/22 0445   01/17/22 0545  piperacillin-tazobactam (ZOSYN) IVPB 3.375 g  Status:  Discontinued        3.375 g 12.5 mL/hr over 240 Minutes Intravenous Every 8 hours 01/17/22 0455 01/18/22 1339   01/17/22 0545  vancomycin (VANCOREADY) IVPB 1750 mg/350 mL        1,750 mg 87.5 mL/hr over 240 Minutes Intravenous  Once 01/17/22 0457 01/17/22 0929   01/17/22 0145  cefTRIAXone (ROCEPHIN) 1 g in sodium chloride 0.9 % 100 mL IVPB   Status:  Discontinued       Note to Pharmacy: Give following initial 1 gm dose to make total dose of 2 gm .   1 g 200 mL/hr over 30 Minutes Intravenous  Once 01/17/22 0136 01/17/22 0445   01/17/22 0130  azithromycin (ZITHROMAX) 500 mg in sodium chloride 0.9 % 250 mL IVPB  Status:  Discontinued        500 mg 250 mL/hr over 60 Minutes Intravenous Every 24 hours 01/17/22 0121 01/17/22 0445   01/17/22 0045  cefTRIAXone (ROCEPHIN) 1 g in sodium chloride 0.9 % 100 mL IVPB        1 g 200 mL/hr over 30 Minutes Intravenous  Once 01/17/22 0034 01/17/22 0155   01/17/22 0045  azithromycin (ZITHROMAX) 500 mg in sodium chloride 0.9 % 250 mL IVPB        500 mg 250 mL/hr over 60 Minutes Intravenous  Once 01/17/22 0034 01/17/22 0302        Subjective: Patient was seen and examined at bedside.  Overnight events noted. Patient appears very weak, frail and deconditioned.   She reports feeling better,  still coughing up phlegm.  She denies any pain.  Objective:  Vitals:   01/26/22 2118 01/27/22 0509 01/27/22 0819 01/27/22 1208  BP:  (!) 128/58 (!) 125/57 118/71  Pulse:  62 63 60  Resp:  20 17 17   Temp:  98.9 F (37.2 C) 99.2 F (37.3 C) 99.4 F (37.4 C)  TempSrc:      SpO2: 92% 99% 100% 99%  Weight:  77 kg    Height:        Intake/Output Summary (Last 24 hours) at 01/27/2022 1332 Last data filed at 01/27/2022 0518 Gross per 24 hour  Intake --  Output 1600 ml  Net -1600 ml   Filed Weights   01/23/22 0600 01/25/22 0500 01/27/22 0509  Weight: 77.8 kg 77.3 kg 77 kg    Examination:  General exam: Appears comfortable, frail, deconditioned, not in any acute distress. Respiratory system: CTA bilaterally, no wheezing, no crackles, normal respiratory effort. Cardiovascular system: S1 & S2 heard, regular rate and rhythm, no murmur. Gastrointestinal system: Abdomen is soft, non tender, non distended, BS + Central nervous system: Alert and oriented X 2. No focal neurological deficits. Extremities:  No edema, no cyanosis, no clubbing. Skin: No rashes, lesions or ulcers Psychiatry: Judgement and insight appear normal. Mood & affect appropriate.     Data Reviewed: I have personally reviewed following labs and imaging studies  CBC: Recent Labs  Lab 01/23/22 0336 01/24/22 0443 01/25/22 0739 01/26/22 0608 01/27/22 0452  WBC 6.8 5.4 5.5 6.2 6.8  NEUTROABS  --  3.8 3.5 4.1 4.6  HGB 9.2* 9.1* 9.4* 9.3* 9.5*  HCT 29.3* 29.1* 30.3* 30.0* 30.2*  MCV 85.2 85.8 86.1 85.7 86.0  PLT 132* 146* 154 162 469   Basic Metabolic Panel: Recent Labs  Lab 01/21/22 0448 01/22/22 0445 01/23/22 0336 01/24/22 0443 01/25/22 0739 01/27/22 0452  NA 137 137 135 136 139 139  K 3.8 3.6 3.8 3.5 4.0 4.6  CL 109 109 109 109 112* 111  CO2 25 25 20* 23 23 25   GLUCOSE 151* 117* 86 107* 111* 132*  BUN 19 19 18 19 23  32*  CREATININE 1.10* 0.91 0.88 0.75 0.64 0.74  CALCIUM 9.0 9.2 9.5 9.4 9.6 9.5  MG 2.0 2.0 2.0 2.0 2.0  --   PHOS 2.8 2.5 2.5 2.8 3.0  --    GFR: Estimated Creatinine Clearance: 50 mL/min (by C-G formula based on SCr of 0.74 mg/dL). Liver Function Tests: Recent Labs  Lab 01/24/22 0443 01/25/22 0739  AST 19 16  ALT 14 12  ALKPHOS 62 59  BILITOT 0.7 0.2*  PROT 5.8* 5.8*  ALBUMIN 2.3* 2.2*   No results for input(s): LIPASE, AMYLASE in the last 168 hours. No results for input(s): AMMONIA in the last 168 hours. Coagulation Profile: No results for input(s): INR, PROTIME in the last 168 hours. Cardiac Enzymes: No results for input(s): CKTOTAL, CKMB, CKMBINDEX, TROPONINI in the last 168 hours. BNP (last 3 results) No results for input(s): PROBNP in the last 8760 hours. HbA1C: No results for input(s): HGBA1C in the last 72 hours. CBG: Recent Labs  Lab 01/26/22 2230 01/27/22 0030 01/27/22 0520 01/27/22 0811 01/27/22 1208  GLUCAP 118* 131* 133* 110* 131*   Lipid Profile: Recent Labs    01/25/22 0739  TRIG 44   Thyroid Function Tests: No results for input(s): TSH,  T4TOTAL, FREET4, T3FREE, THYROIDAB in the last 72 hours. Anemia Panel: No results for input(s): VITAMINB12, FOLATE, FERRITIN, TIBC, IRON, RETICCTPCT in the last 72 hours. Sepsis Labs: No results for input(s): PROCALCITON, LATICACIDVEN in the  last 168 hours.  Recent Results (from the past 240 hour(s))  Culture, blood (Routine X 2) w Reflex to ID Panel     Status: None   Collection Time: 01/18/22  3:57 AM   Specimen: BLOOD  Result Value Ref Range Status   Specimen Description BLOOD LEFT HAND  Final   Special Requests   Final    BOTTLES DRAWN AEROBIC ONLY Blood Culture adequate volume   Culture   Final    NO GROWTH 5 DAYS Performed at [facility], [address] [place], [place] [address]    Report Status 01/23/2022 FINAL  Final   Radiology Studies: [facility] Chest Port 1 View  Result Date: 01/26/2022 CLINICAL DATA:  Shortness of breath EXAM: PORTABLE CHEST 1 VIEW COMPARISON:  01/19/2022, CT 12/18/2021, radiograph 11/05/2021 FINDINGS: Removal of the endotracheal tube. Left-sided pacing device as before. Bilateral shoulder replacements. Cardiomegaly with trace bilateral effusions. Underlying emphysema and chronic lung disease. Increased airspace disease in the right upper lobe and bilateral bases. Right upper extremity central venous catheter tip over the cavoatrial region. IMPRESSION: 1. Cardiomegaly with suspected trace pleural effusions. Suspect superimposed mild interstitial edema on underlying chronic lung disease 2. Heterogeneous bilateral airspace opacities appear slightly worsened in the right upper lobe and right base and may reflect atelectasis or pneumonia. Electronically Signed   By: Donavan Foil M.D.   On: 01/26/2022 01:15    Scheduled Meds:  apixaban  2.5 mg Oral BID   vitamin C  500 mg Oral BID   aspirin  81 mg Oral Daily   chlorhexidine  15 mL Mouth Rinse BID   Chlorhexidine Gluconate Cloth  6 each Topical Daily   feeding supplement (NEPRO CARB STEADY)  237 mL Oral TID  BM   insulin aspart  0-9 Units Subcutaneous Q8H   lidocaine  1 patch Transdermal Q24H   mouth rinse  15 mL Mouth Rinse q12n4p   melatonin  10 mg Oral QHS   pantoprazole (PROTONIX) IV  40 mg Intravenous Q24H   rOPINIRole  4 mg Oral QHS   sodium chloride flush  10-40 mL Intracatheter Q12H   Continuous Infusions:  sodium chloride Stopped (01/17/22 1742)   sodium chloride     TPN ADULT (ION) 70 mL/hr at 01/26/22 1725   TPN ADULT (ION)       LOS: 10 days    Time spent: 50 mins    Tiffanye Hartmann, MD [facility]   If 7PM-7AM, please contact night-coverage

## 2022-01-27 NOTE — Progress Notes (Signed)
Physical Therapy Treatment Patient Details Name: Kristin [name] MRN: [id number] DOB: 1930-02-26 Today's Date: 01/27/2022   History of Present Illness 88 year old female presenting to [facility] ED from home via EMS on 01/16/2022 with complaints of lethargy and weakness.  Per the patient's granddaughter bedside, Kristin [name], patient was in her normal state of health until earlier in the evening on 01/16/2022 when she picked up the phone but did not respond.  Patient's granddaughter lives next door and walked over to check on her finding her sitting at the kitchen table with her head on the table.  She was responsive and able to talk but very weak.  Only other thing family noticed was that the patient was sleeping more than usual.  No other complaints that family was aware of. PMHx significant for atrial fibrillation, COPD, HLD, HTN, and gout.    PT Comments    Pt was long sitting in bed with supportive granddaughter at bedside. " She is far from baseline cognition. She made a cake all alone the day prior to admission. She has a history of UTIs." Pt is awake and alert but only oriented to self. Needs increased time to process and respond. Slightly impulsive. Has productive cough several times during session. SOB noted throughout, even at rest prior to OOB activity. Pt required a lot of assistance to exit bed and stand to RW. Required max assist to stand pivot without AD to recliner. Will return later to assist pt back to bed. Highly recommend DC to STR to address deficits while assisting pt to PLOF.     Recommendations for follow up therapy are one component of a multi-disciplinary discharge planning process, led by the attending physician.  Recommendations may be updated based on patient status, additional functional criteria and insurance authorization.  Follow Up Recommendations  Skilled nursing-short term rehab (<3 hours/day)     Assistance Recommended at Discharge Frequent or constant  Supervision/Assistance  Patient can return home with the following A lot of help with walking and/or transfers;A lot of help with bathing/dressing/bathroom;Assistance with cooking/housework;Assistance with feeding;Direct supervision/assist for financial management;Direct supervision/assist for medications management;Assist for transportation;Help with stairs or ramp for entrance   Equipment Recommendations  Other (comment) (defer to next level of care)       Precautions / Restrictions Precautions Precautions: Fall Restrictions Weight Bearing Restrictions: No     Mobility  Bed Mobility Overal bed mobility: Needs Assistance Bed Mobility: Supine to Sit, Sit to Supine  Supine to sit: HOB elevated, Max assist  General bed mobility comments: Pt requires max assist of one to exit bed. Increased time and vcs and tcs throughout.  Transfers Overall transfer level: Needs assistance Equipment used: Rolling walker (2 wheels) Transfers: Sit to/from Stand Sit to Stand: Max assist  General transfer comment: Max assist to stand to RW. Unable to advance to taking steps from EOB. Stood pivot without AD so author could block knees and pivot to recliner.    Ambulation/Gait  General Gait Details: Was able to stand pivot. Not safe to advance to ambulation.    Balance Overall balance assessment: Needs assistance Sitting-balance support: Feet supported, Bilateral upper extremity supported Sitting balance-Leahy Scale: Good     Standing balance support: Reliant on assistive device for balance, Bilateral upper extremity supported Standing balance-Leahy Scale: Poor      Cognition Arousal/Alertness: Awake/alert Behavior During Therapy: Flat affect Overall Cognitive Status: Impaired/Different from baseline Area of Impairment: Orientation, Memory, Attention, Following commands, Awareness, Problem solving, Safety/judgement    Orientation Level:  Disoriented to, Place, Time, Situation Current Attention  Level: Sustained Memory: Decreased recall of precautions, Decreased short-term memory Following Commands: Follows one step commands with increased time Safety/Judgement: Decreased awareness of safety, Decreased awareness of deficits     General Comments: Pt is A and O x 3               Pertinent Vitals/Pain Pain Assessment Pain Assessment: PAINAD Negative Vocalization: occasional moan/groan, low speech, negative/disapproving quality Facial Expression: sad, frightened, frown Body Language: tense, distressed pacing, fidgeting Consolability: distracted or reassured by voice/touch Pain Location: back and legs Pain Descriptors / Indicators: Grimacing, Sore Pain Intervention(s): Limited activity within patient's tolerance, Monitored during session, Repositioned     PT Goals (current goals can now be found in the care plan section) Acute Rehab PT Goals Patient Stated Goal: none stated Progress towards PT goals: Progressing toward goals    Frequency    Min 2X/week      PT Plan Current plan remains appropriate    Co-evaluation     PT goals addressed during session: Mobility/safety with mobility;Balance;Proper use of DME;Strengthening/ROM        AM-PAC PT "6 Clicks" Mobility   Outcome Measure  Help needed turning from your back to your side while in a flat bed without using bedrails?: A Lot Help needed moving from lying on your back to sitting on the side of a flat bed without using bedrails?: A Lot Help needed moving to and from a bed to a chair (including a wheelchair)?: A Lot Help needed standing up from a chair using your arms (e.g., wheelchair or bedside chair)?: A Lot Help needed to walk in hospital room?: Total Help needed climbing 3-5 steps with a railing? : Total 6 Click Score: 10    End of Session Equipment Utilized During Treatment: Oxygen (5 L [place]  SOB noted, Pt SOB with minimal activity) Activity Tolerance: Patient limited by fatigue Patient left: in  chair;with call bell/phone within reach;with chair alarm set;with family/visitor present;with nursing/sitter in room Nurse Communication: Mobility status PT Visit Diagnosis: Unsteadiness on feet (R26.81);Muscle weakness (generalized) (M62.81);Difficulty in walking, not elsewhere classified (R26.2)     Time: 3267-1245 PT Time Calculation (min) (ACUTE ONLY): 16 min  Charges:  $Therapeutic Activity: 8-22 mins                     Julaine Fusi PTA 01/27/22, 12:50 PM

## 2022-01-27 NOTE — Progress Notes (Signed)
Nutrition Follow-up  DOCUMENTATION CODES:   Non-severe (moderate) malnutrition in context of chronic illness  INTERVENTION:   -Continue 500 mg vitamin C BID -Continue Nepro Shake po TID, each supplement provides 425 kcal and 19 grams protein  -Continue feeding assistance with meals -TPN management per pharmacy  NUTRITION DIAGNOSIS:   Moderate Malnutrition related to chronic illness (COPD) as evidenced by mild fat depletion, moderate fat depletion, mild muscle depletion, moderate muscle depletion.  Ongoing  GOAL:   Patient will meet greater than or equal to 90% of their needs  Progressing   MONITOR:   Labs, Weight trends, Skin, I & O's, Other (Comment) (TPN)  REASON FOR ASSESSMENT:   Ventilator    ASSESSMENT:   88 y/o female with h/o breast cancer s/p lumpectomy/chemo/XRT, CHF, CKD III, Afib, COPD, lung cancer, pacemaker, OVD, DDD, gout, GERD, HTN and COVID 19 (2021) who is admitted with acute hypoxic / hypercapnic respiratory failure secondary to suspected aspiration in the setting of possible CAP & acute HFpEF exacerbation, AKI and encephalopathy.  6/3- s/p PICC, TPN initiated 6/4- s/p BSE- advanced to dysphagia 1 diet with nectar thick liquids 6/7- s/p BSE- advanced to dysphagia 3 diet with nectar thick liquids    Reviewed I/O's: -1.5 L x 24 hours and +987 ml since admission  UOP: 1.6 L x 24 hours  Spoke with pt at bedside, who was more alert compared to previous visit. Pt shares that her swallowing is improving, but she is eating more. Per pt, she ate "most" of her dinner last night, but is unsure of what she ate. Breakfast tray at bedside unattempted. Noted documented meal completions 50-75%. Pt estimates she is drinking one Nepro shake daily.   Pt reports liking the pureed foods. Discussed plan for potential for diet upgrade with SLP. Pt amenable to continue supplements. Discussed importance of good meal and supplement intake to promote healing.   Case discussed  with MD; pt and family continuing to request aggressive measures. Plan to continue TPN now, with potential to transition to PEG if intake remains inadequate. Intake has improved over the past 48 hours. Hopeful that diet upgrade will help pt continue to improve oral intake.   Per pharmacy notes, pt receiving TPN at goal rate of 70 ml/hr, which provides 1830 kcals and 91 grams protein, meeting 100% of estimated kcal and protein needs. Pt started on TPN due to inability to place NGT.   Medications reviewed and include vitamin C  Labs reviewed: CBGS: 110-133 (inpatient orders for glycemic control are 0-9 units insulin aspart every 8 hours).    Diet Order:   Diet Order             Diet NPO time specified  Diet effective now                   EDUCATION NEEDS:   Not appropriate for education at this time  Skin:  Skin Assessment: Skin Integrity Issues: Skin Integrity Issues:: Stage II Stage II: rt coccyx, lt coccyx, lower medial sacrum  Last BM:  01/24/22  Height:   Ht Readings from Last 1 Encounters:  01/21/22 5' 7.01" (1.702 m)    Weight:   Wt Readings from Last 1 Encounters:  01/27/22 77 kg    Ideal Body Weight:  61.4 kg  BMI:  Body mass index is 26.58 kg/m.  Estimated Nutritional Needs:   Kcal:  3329-5188  Protein:  85-100 grams  Fluid:  > 1.7 L    Shyloh Derosa W,  RD, LDN, [place] Registered Dietitian II Certified Diabetes Care and Education Specialist Please refer to [facility] for RD and/or RD on-call/weekend/after hours pager

## 2022-01-27 NOTE — Progress Notes (Addendum)
Speech Language Pathology Treatment: Dysphagia  Patient Details Name: Kristin [name] MRN: [id number] DOB: March 28, 1930 Today's Date: 01/27/2022 Time: 1020-1050 SLP Time Calculation (min) (ACUTE ONLY): 30 min  Assessment / Plan / Recommendation Clinical Impression  Pt was seen at bedside to assess readiness for advanced textures. RN reports pt has been eating 25-50% of meals, and appears to be tolerating puree/NTL with crushed meds. Pt was awake and alert upon arrival of SLP. Shortness of breath, and audible, wheezy breathing sounds noted prior to PO presentations.   Oral care completed, following which pt accepted trials of ice chips, thin liquid, nectar thick liquid, puree, and solid textures. O2 sats were noted to range from 63 to 100, ranging up and down throughout the session. Pt's SOB and wheezing also noted to continue. Intermittent belching and occasional throat clearing noted across textures. Pt tolerated graham cracker trial with timely oral prep and clearing, and no apparent difficuty or overt s/s aspiration. Recommend advancing diet to ground, to encourage increased PO intake. Will continue nectar thick liquids at this time.  Recommend consideration of an instrumental swallow evaluation to objectively assess pt's safety for PO intake and determine risk of aspiration prior to advancing diet further.   HPI HPI: Per admitting H&P "Kristin [name] is a 88 y.o. 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  Son at bedside and contributes to history.  Patient unable to arouse to answer questions  ED course and data review: Vitals within normal limits on arrival.  ABG on 4 L shows pH  7.27 with PCO2 of 50 and PO2 of 60.  CBC unremarkable and lactic acid 1.5.  CMP significant for creatinine of 1.5 above baseline of 1.18.  Troponin 62.  COVID-negative.  EKG, personally viewed and interpreted shows sinus rhythm at 64 with no acute ST-T wave changes.  Chest x-ray shows the following:  IMPRESSION:  Multifocal patchy opacities, progressive when compared to prior.  While some of this may reflect tumor in the right upper lobe,  superimposed multifocal pneumonia is suspected."      SLP Plan  Continue with current plan of care      Recommendations for follow up therapy are one component of a multi-disciplinary discharge planning process, led by the attending physician.  Recommendations may be updated based on patient status, additional functional criteria and insurance authorization.    Recommendations  Diet recommendations: Dysphagia 2 (fine chop);Nectar-thick liquid Liquids provided via: No straw;Cup;Teaspoon Medication Administration: Crushed with puree Supervision: Full supervision/cueing for compensatory strategies;Staff to assist with self feeding Compensations: Minimize environmental distractions;Slow rate;Small sips/bites;Other (Comment) Postural Changes and/or Swallow Maneuvers: Out of bed for meals;Seated upright 90 degrees;Upright 30-60 min after meal                Follow Up Recommendations: Skilled nursing-short term rehab (<3 hours/day) Assistance recommended at discharge: Frequent or constant Supervision/Assistance SLP Visit Diagnosis: Dysphagia, oropharyngeal phase (R13.12) Plan: Continue with current plan of care          Paxtyn Wisdom B. Quentin Ore, [facility], CCC-SLP Speech Language Pathologist  Shonna Chock 01/27/2022, 10:59 AM

## 2022-01-27 NOTE — Progress Notes (Signed)
PHARMACIST - PHYSICIAN COMMUNICATION  DR:  Dwyane Dee  CONCERNING: IV to Oral Route Change Policy  RECOMMENDATION: This patient is receiving pantoprazole by the intravenous route.  Based on criteria approved by the Pharmacy and Therapeutics Committee, the intravenous medication(s) is/are being converted to the equivalent oral dose form(s).   DESCRIPTION: These criteria include: The patient is eating (either orally or via tube) and/or has been taking other orally administered medications for a least 24 hours The patient has no evidence of active gastrointestinal bleeding or impaired GI absorption (gastrectomy, short bowel, patient on TNA or NPO).  If you have questions about this conversion, please contact the Pharmacy Department  []   [phone] )  [facility] [x]   [phone] )  [facility] []   [phone] )  [facility] []   [phone] )  [facility] []   [phone] )  [place], [facility] 01/27/2022 1:43 PM

## 2022-01-28 ENCOUNTER — Inpatient Hospital Stay: Payer: Medicare HMO

## 2022-01-28 DIAGNOSIS — C3490 Malignant neoplasm of unspecified part of unspecified bronchus or lung: Secondary | ICD-10-CM

## 2022-01-28 DIAGNOSIS — J189 Pneumonia, unspecified organism: Secondary | ICD-10-CM | POA: Diagnosis not present

## 2022-01-28 LAB — COMPREHENSIVE METABOLIC PANEL
ALT: 9 U/L (ref 0–44)
AST: 19 U/L (ref 15–41)
Albumin: 2.4 g/dL — ABNORMAL LOW (ref 3.5–5.0)
Alkaline Phosphatase: 58 U/L (ref 38–126)
Anion gap: 4 — ABNORMAL LOW (ref 5–15)
BUN: 39 mg/dL — ABNORMAL HIGH (ref 8–23)
CO2: 28 mmol/L (ref 22–32)
Calcium: 9.7 mg/dL (ref 8.9–10.3)
Chloride: 105 mmol/L (ref 98–111)
Creatinine, Ser: 0.75 mg/dL (ref 0.44–1.00)
GFR, Estimated: >60 mL/min (ref >60)
Glucose, Bld: 121 mg/dL — ABNORMAL HIGH (ref 70–99)
Potassium: 4.7 mmol/L (ref 3.5–5.1)
Sodium: 137 mmol/L (ref 135–145)
Total Bilirubin: 0.5 mg/dL (ref 0.3–1.2)
Total Protein: 6.5 g/dL (ref 6.5–8.1)

## 2022-01-28 LAB — GLUCOSE, CAPILLARY
Glucose-Capillary: 120 mg/dL — ABNORMAL HIGH (ref 70–99)
Glucose-Capillary: 126 mg/dL — ABNORMAL HIGH (ref 70–99)
Glucose-Capillary: 128 mg/dL — ABNORMAL HIGH (ref 70–99)
Glucose-Capillary: 133 mg/dL — ABNORMAL HIGH (ref 70–99)
Glucose-Capillary: 134 mg/dL — ABNORMAL HIGH (ref 70–99)
Glucose-Capillary: 74 mg/dL (ref 70–99)

## 2022-01-28 LAB — CBC
HCT: 30.4 % — ABNORMAL LOW (ref 36.0–46.0)
Hemoglobin: 9.5 g/dL — ABNORMAL LOW (ref 12.0–15.0)
MCH: 26.7 pg (ref 26.0–34.0)
MCHC: 31.3 g/dL (ref 30.0–36.0)
MCV: 85.4 fL (ref 80.0–100.0)
Platelets: 204 10*3/uL (ref 150–400)
RBC: 3.56 MIL/uL — ABNORMAL LOW (ref 3.87–5.11)
RDW: 18.8 % — ABNORMAL HIGH (ref 11.5–15.5)
WBC: 7.6 10*3/uL (ref 4.0–10.5)
nRBC: 0 % (ref 0.0–0.2)

## 2022-01-28 LAB — PHOSPHORUS: Phosphorus: 4.3 mg/dL (ref 2.5–4.6)

## 2022-01-28 LAB — MAGNESIUM: Magnesium: 2.1 mg/dL (ref 1.7–2.4)

## 2022-01-28 LAB — TRIGLYCERIDES: Triglycerides: 35 mg/dL (ref <150)

## 2022-01-28 MED ORDER — SODIUM CHLORIDE 0.9 % IV SOLN
1.0000 g | INTRAVENOUS | Status: DC
  Administered 2022-01-28 – 2022-01-30 (×3): 1 g via INTRAVENOUS
  Filled 2022-01-28: qty 10
  Filled 2022-01-28: qty 1
  Filled 2022-01-28 (×2): qty 10

## 2022-01-28 MED ORDER — TRAVASOL 10 % IV SOLN
INTRAVENOUS | Status: AC
  Filled 2022-01-28: qty 907.2

## 2022-01-28 MED ORDER — OXYCODONE HCL 5 MG PO TABS
5.0000 mg | ORAL_TABLET | ORAL | Status: DC | PRN
  Administered 2022-01-28 – 2022-01-29 (×2): 5 mg via ORAL
  Filled 2022-01-28 (×2): qty 1

## 2022-01-28 MED ORDER — FUROSEMIDE 40 MG PO TABS
40.0000 mg | ORAL_TABLET | Freq: Every day | ORAL | Status: DC
  Administered 2022-01-28: 40 mg via ORAL
  Filled 2022-01-28: qty 1

## 2022-01-28 NOTE — Progress Notes (Signed)
Daily Progress Note   Patient Name: Kristin [name]       Date: 01/28/2022 DOB: 02-20-1930  Age: 88 y.o. MRN#: [id number] Attending Physician: Shawna Clamp, MD Primary Care Physician: [name], MD Admit Date: 01/16/2022  Reason for Consultation/Follow-up: Establishing goals of care  Patient Profile/HPI: 88 y.o. female  with past medical history of A-fib on Eliquis, right breast cancer 2004, HFpEF, COPD, hepatitis C, NSTEMI in 2022, SCC lung stage II status post palliative radiation- she is not a candidate for further treatment. She was admitted on 01/16/2022 with acute hypoxic/hypercapnic respiratory failure secondary to suspected aspiration pneumonia. Admission has been complicated- she was intubated, extubated, however she is failing to thrive. Initially started on dysphagia diet- however, MBS today shows significant ongoing aspiration. Her mental status has not improved- she continues to be confused, unable to follow commands, she is mumbling, lethargic.   Subjective: Chart reviewed including progress notes from this admission and outpatient visits, labs, imaging.  Met with multiple family members- granddaughter [name] and her spouse, son, niece and her sister and a few others.  Dr. Dwyane Dee was kindly present and gave family members updated information.  We discussed that patient appears to be dying. She is unable to eat or drink. She is sleeping more than she is awake. She has multiple incurable illnesses that unfortunately people die from- lung cancer, and dysphagia with ongoing aspiration leading to pneumonia.  Options for continued aggressive life prolonging care including artificial nutrition, PEG tube, vs transition to comfort focused care were discussed. Medical recommendations were made  for hospice care and comfort- patient appears to be suffering and current interventions are prolonging her suffering.  Hospice philosophy and services were reviewed.  Family's questions were answered.    Review of Systems  Unable to perform ROS: Mental status change     Physical Exam Vitals and nursing note reviewed.  Constitutional:      Comments: Cachectic, frail  Neurological:     Comments: Lethargic, confused             Vital Signs: BP (!) 127/57 (BP Location: Left Arm)   Pulse 70   Temp 97.7 F (36.5 C)   Resp 18   Ht 5' 7.01" (1.702 m)   Wt 77 kg   SpO2 91%   BMI 26.58 kg/m  SpO2: SpO2: 91 % O2 Device: O2 Device: Nasal Cannula O2 Flow Rate: O2 Flow Rate (L/min): 4 L/min  Intake/output summary:  Intake/Output Summary (Last 24 hours) at 01/28/2022 1532 Last data filed at 01/28/2022 1132 Gross per 24 hour  Intake 10 ml  Output 1300 ml  Net -1290 ml   LBM: Last BM Date : 01/26/22 Baseline Weight: Weight: 75.8 kg Most recent weight: Weight: 77 kg       Palliative Assessment/Data: PPS: 20%      Patient Active Problem List   Diagnosis Date Noted   Pressure injury of skin 01/23/2022   Malnutrition of moderate degree 01/18/2022   Acute respiratory failure ([place]) 41/66/0630   Acute metabolic encephalopathy/unresponsive    Sepsis ([place]) 12/18/2021   Palliative care encounter    Hypertension 12/17/2021   AMS (altered mental status) 12/17/2021   Malignant neoplasm of right upper lobe of lung ([place]) 12/02/2021   Small cell lung cancer ([place]) 11/17/2021   Goals of care, counseling/discussion 11/17/2021   Overweight (BMI 25.0-29.9) 11/11/2021   Bacteremia 11/06/2021   Multifocal pneumonia 11/05/2021   Mass of upper lobe of right lung 11/05/2021   Sepsis due to Escherichia coli (E. coli) ([place]) 11/05/2021   Dyslipidemia 11/05/2021   COPD (chronic obstructive pulmonary disease) ([place]) 09/20/2021   Lymphedema 09/17/2021   Ischemic leg pain (Bilateral) 08/19/2021    Paroxysmal atrial fibrillation (H[facility])    History of atrial fibrillation 04/08/2021   PVD (peripheral vascular disease) ([place]) 04/08/2021   Low blood pressure reading 04/07/2021   Physical tolerance to opiate drug 01/28/2021   Chronic use of opiate for therapeutic purpose 11/24/2020   Atherosclerosis of native arteries of the extremities with ulceration ([place]) 16/08/930   Uncomplicated opioid dependence ([place]) 10/20/2020   Encounter for chronic pain management 10/02/2020   Encounter for pain management counseling 10/02/2020   Chronic kidney disease 10/02/2020   Chronic sacroiliac pain (Bilateral) 09/30/2020   Osteoarthritis of sacroiliac joints (Bilateral) ([place]) 09/30/2020   Chronic midline low back pain without sciatica 09/30/2020   Acute on chronic respiratory failure with hypoxia ([place]) 08/02/2020   Prolonged QT interval 08/02/2020   Venous insufficiency of both lower extremities 07/04/2020   Varicose veins of left lower extremity with inflammation, with ulcer of ankle limited to breakdown of skin ([place]) 04/01/2020   Opioid dependence with opioid-induced disorder ([place]) 02/14/2020   Abnormal MRI, lumbar spine (11/03/2019) 01/23/2020   Pharmacologic therapy 12/20/2019   Other specified dorsopathies, sacral and sacrococcygeal region 06/12/2019   Chronic hip pain (Left) 06/12/2019   Osteoarthritis of hip (Left) 06/12/2019   Greater trochanteric bursitis of hip (Left) 03/12/2019   Preop testing 01/22/2019   Cervicalgia 10/03/2018   Chronic shoulders pain (Bilateral) (L>R) 05/29/2018   Osteoarthritis of shoulder (Bilateral) 05/29/2018   Osteoarthritis of hip (Bilateral) 05/29/2018   Chronic shoulder pain after shoulder replacement (Bilateral) (L>R) 05/29/2018   Trigger finger, left middle finger 05/29/2018   Spondylosis without myelopathy or radiculopathy, lumbosacral region 02/09/2018   Insomnia 02/01/2018   Numbness and tingling of upper extremity (Bilateral) 09/07/2017   Chronic shoulder  pain, Radicular (Bilateral) (L>R) 09/07/2017   Cervical facet syndrome (Bilateral) (L>R) 09/07/2017   Cervical (3-4 mm) Anterolisthesis of C4 on C5 09/07/2017   Cervical foraminal stenosis (Bilateral) 09/07/2017   Acute renal failure superimposed on stage 3a chronic kidney disease ([place]) 08/14/2017   Chronic lower extremity pain (2ry area of Pain) (Bilateral) (L>R) 05/13/2017   DDD (degenerative disc disease), cervical 05/13/2017   Chronic cervical radicular  pain (3ry area of Pain) (Bilateral) (L>R) 05/04/2017   Chronic anticoagulation ([place]) 05/04/2017   Chronic hip pain (Bilateral) 04/20/2017   Chronic neck pain 03/01/2017   Hyperlipidemia 02/02/2017   Red blood cell antibody positive 11/18/2016   Anticoagulated 11/17/2016   Atrial fibrillation and flutter ([place]) 11/17/2016   Osteoarthritis involving multiple joints 06/14/2016   Cardiac pacemaker in situ 01/07/2016   S/P cardiac pacemaker procedure 01/07/2016   GERD (gastroesophageal reflux disease) 12/18/2015   Chronic pain syndrome 06/25/2015   Chronic low back pain (1ry area of Pain) (Bilateral) (L>R) w/ sciatica (Bilateral) 06/25/2015   Lumbar facet syndrome (Bilateral) (L>R) 06/25/2015   Lumbar spondylosis 06/25/2015   Chronic lumbar radicular pain (Bilateral) (L>R) 06/25/2015   Restless leg 06/25/2015   Myofascial pain 06/25/2015   Neurogenic pain 06/25/2015   Long term current use of opiate analgesic 06/25/2015   Long term prescription opiate use 06/25/2015   Opiate use (90 MME/Day) 06/25/2015   Opioid-induced constipation (OIC) 06/25/2015   Vitamin D insufficiency 06/25/2015   Grade 1 Retrolisthesis of L1 over L2 06/25/2015   Lumbar facet hypertrophy (L1-2, L2-3, and L4-5) 06/25/2015   Lumbar lateral recess stenosis (L1-2 and L4-5) 06/25/2015   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) 06/25/2015   Epidural fibrosis 06/25/2015   Lumbar foraminal stenosis (Severe Left L4-5) 06/25/2015    Osteoporosis 06/25/2015   Chronic sacroiliac joint pain (Left) 06/25/2015   History of methicillin resistant staphylococcus aureus (MRSA) 06/25/2015   Mitral valve prolapse 06/25/2015   Seasonal allergies 06/17/2015   History of breast cancer 06/17/2015   Chronic diastolic CHF (congestive heart failure) ([place]) 12/26/2014   Centrilobular emphysema ([place]) 09/16/2014   S/P cardiac catheterization 04/04/2014   DDD (degenerative disc disease), lumbosacral 04/03/2014   Benign hypertension with CKD (chronic kidney disease) stage III ([place]) 04/03/2014   Lumbar central spinal stenosis 04/03/2014   Chronic kidney disease (CKD), stage III (moderate) ([place]) 10/30/2013   Gout 10/30/2013    Palliative Care Assessment & Plan    Assessment/Recommendations/Plan  Hospice/comfort care recommended Family to discuss and make decision by tomorrow   Code Status: Full code  Prognosis:  Unable to determine  Discharge Planning: To Be Determined  Care plan was discussed with family and care team.   Thank you for allowing the Palliative Medicine Team to assist in the care of this patient.  Total time:  80 minutes    Greater than 50%  of this time was spent counseling and coordinating care related to the above assessment and plan.  Mariana Kaufman, AGNP-C Palliative Medicine   Please contact Palliative Medicine Team phone at [phone] for questions and concerns.

## 2022-01-28 NOTE — Progress Notes (Signed)
PROGRESS NOTE    Kristin [name]  EPP:[id number] DOB: 12-Sep-1929 DOA: 01/16/2022  PCP: [name], MD   Brief Narrative:  This 88 years old female with PMH significant for COPD, chronic diastolic CHF, lung cancer, CKD stage IIIa presented in the ED with c/o: lethargy and weakness.  Patient was sleeping more than usual, In the ED patient was found to have multifocal patchy opacities on chest x-ray,  there was suspected tumor in the right upper lobe with superimposed multifocal pneumonia.  Patient decompensated and required mechanical ventilation for airway protection.  Patient displayed seizure-like activity while on the ventilator.  She was loaded with Keppra and neurology was consulted.  EEG showed diffuse encephalopathy of indeterminate etiology.  Oncology was also consulted as patient carries a known diagnosis of small cell lung cancer.  As per Dr. Rogue Bussing patient is not a candidate for chemotherapy.  Patient has overall poor prognosis.  CT head and neck was negative for any brain mets.  Patient also had atrial fibrillation with RVR, started on heparin drip.  She was extubated on 01/21/2022.  Patient overall was less communicative,  sedating medications were discontinued.  Palliative care was consulted, family wished to maintain full scope of care.  Family is also agreeable to the PEG tube.  Speech and swallow felt that patient is at high risk for aspiration recommendation was NPO.  Patient is currently getting TPN for this reason.  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 to suspected aspiration in the setting of possible CAP and acute HFpEF exacerbation. Continue supplemental oxygen, saturating 95% on 5 L.  Acute on chronic HFpEF exacerbation, POA: Patient has mildly elevated troponin sec. to suspected demand ischemia. Continue cardiac monitoring. Daily weight, intake output charting. Continue IV Lasix for diuresis.  Paroxysmal atrial fibrillation: Continue cardiac monitoring.   Heart rate is controlled, continue Eliquis.  Acute kidney injury on CKD stage IIIb: Baseline serum creatinine 1.18, creatinine on admission 1.50. Avoid nephrotoxic medications and hypotension Serum creatinine improved and back to normal.  Acute encephalopathy unknown etiology> resolved. CT head without contrast : No brain mets noted. Consider EEG /MRI brain if mentation does not improve with oxygenation support. Avoid sedating medications as able.  Dysphagia: Patient was initially evaluated by speech on 01/23/22. She was felt to be at high risk for aspiration.  Recommendation was for NPO. Patient is currently receiving TPN for this reason.   Patient was reevaluated by speech/ underwent MBS, recommended NPO, consider alternate route of feeding.  Gabriel Carina UTI: Patient has completed antibiotics treatment. UA : remains +, will discuss ID   Non-small cell lung CA: Appreciate Dr. Aletha Halim assistance. Patient has completed course of palliative radiation. CT head without contrast shows no any signs of brain mets.  Goals of care discussion: Patient  a poor prognosis.  It was explained to the granddaughter Kristin [name]. Palliative care was notified.   Family wants to discuss in detail about goals of care Family meeting is arranged today at 3:00.   DVT prophylaxis:Eliquis Code Status: Full code. Family Communication: Spoke with granddaughter [name] on phone. Disposition Plan:   Status is: Inpatient Remains inpatient appropriate because:   Admitted for acute encephalopathy which has been resolved. Completed antibiotics for pneumonia and UTI.  Patient is now getting TPN for dysphagia and poor nutrition.   Palliative care consulted to discuss goals of care.   Consultants:  PCCM Oncology Neurology  speech  Procedures: MRI, Mechanical ventilation and extubation, PICC line, TPN Antimicrobials:  Anti-infectives (From admission, onward)    Start     Dose/Rate Route Frequency Ordered Stop   01/28/22 1415  cefTRIAXone (ROCEPHIN) 1 g in sodium chloride 0.9 % 100 mL IVPB        1 g 200 mL/hr over 30 Minutes Intravenous Every 24 hours 01/28/22 1327     01/20/22 1800  levofloxacin (LEVAQUIN) IVPB 750 mg        750 mg 100 mL/hr over 90 Minutes Intravenous Every 48 hours 01/20/22 1447 01/22/22 1909   01/19/22 1800  Ampicillin-Sulbactam (UNASYN) 3 g in sodium chloride 0.9 % 100 mL IVPB  Status:  Discontinued        3 g 200 mL/hr over 30 Minutes Intravenous Every 6 hours 01/19/22 1427 01/20/22 1443   01/19/22 0600  vancomycin (VANCOREADY) IVPB 1250 mg/250 mL  Status:  Discontinued        1,250 mg 83.3 mL/hr over 180 Minutes Intravenous Every 48 hours 01/17/22 0508 01/17/22 1045   01/19/22 0600  vancomycin (VANCOREADY) IVPB 1500 mg/300 mL  Status:  Discontinued        1,500 mg 75 mL/hr over 240 Minutes Intravenous Every 48 hours 01/17/22 1045 01/18/22 1339   01/18/22 1500  Ampicillin-Sulbactam (UNASYN) 3 g in sodium chloride 0.9 % 100 mL IVPB  Status:  Discontinued        3 g 200 mL/hr over 30 Minutes Intravenous Every 12 hours 01/18/22 1339 01/19/22 1427   01/18/22 0100  cefTRIAXone (ROCEPHIN) 2 g in sodium chloride 0.9 % 100 mL IVPB   Status:  Discontinued        2 g 200 mL/hr over 30 Minutes Intravenous Every 24 hours 01/17/22 0121 01/17/22 0445   01/17/22 0545  piperacillin-tazobactam (ZOSYN) IVPB 3.375 g  Status:  Discontinued        3.375 g 12.5 mL/hr over 240 Minutes Intravenous Every 8 hours 01/17/22 0455 01/18/22 1339   01/17/22 0545  vancomycin (VANCOREADY) IVPB 1750 mg/350 mL        1,750 mg 87.5 mL/hr over 240 Minutes Intravenous  Once 01/17/22 0457 01/17/22 0929   01/17/22 0145  cefTRIAXone (ROCEPHIN) 1 g in sodium chloride 0.9 % 100 mL IVPB  Status:  Discontinued       Note to Pharmacy: Give following initial 1 gm dose to make total dose of 2 gm .   1 g 200 mL/hr over 30 Minutes Intravenous  Once 01/17/22 0136 01/17/22 0445   01/17/22 0130  azithromycin (ZITHROMAX) 500 mg in sodium chloride 0.9 % 250 mL IVPB  Status:  Discontinued        500 mg 250 mL/hr over 60 Minutes Intravenous Every 24 hours 01/17/22 0121 01/17/22 0445   01/17/22 0045  cefTRIAXone (ROCEPHIN) 1 g in sodium chloride 0.9 % 100 mL IVPB  1 g 200 mL/hr over 30 Minutes Intravenous  Once 01/17/22 0034 01/17/22 0155   01/17/22 0045  azithromycin (ZITHROMAX) 500 mg in sodium chloride 0.9 % 250 mL IVPB        500 mg 250 mL/hr over 60 Minutes Intravenous  Once 01/17/22 0034 01/17/22 0302        Subjective: Patient was seen and examined at bedside.  Overnight events noted. Patient appears very weak, frail, deconditioned, chronically ill looking.  She reports feeling fine,  still producing a lot of phlegm.  Denies any chest pain.  Objective: Vitals:   01/27/22 2125 01/28/22 0452 01/28/22 0756 01/28/22 0933  BP:  113/80  (!) 127/57  Pulse:  92 (!) 58 65  Resp:  19 20 15   Temp:  99.2 F (37.3 C)  97.7 F (36.5 C)  TempSrc:      SpO2: 92% 93% 93% 92%  Weight:      Height:        Intake/Output Summary (Last 24 hours) at 01/28/2022 1338 Last data filed at 01/28/2022 1132 Gross per 24 hour  Intake 10 ml  Output 3100 ml  Net -3090  ml   Filed Weights   01/23/22 0600 01/25/22 0500 01/27/22 0509  Weight: 77.8 kg 77.3 kg 77 kg    Examination:  General exam: Appears frail, chronically ill looking, deconditioned, not in any distress. Respiratory system: CTA bilaterally, no wheezing, no crackles, no accessory muscle use. Cardiovascular system: S1 & S2 heard, regular rate and rhythm, no murmur. Gastrointestinal system: Abdomen is soft, non tender, non distended, BS + Central nervous system: Alert and oriented X 2. No focal neurological deficits. Extremities: No edema, no cyanosis, no clubbing. Skin: No rashes, lesions or ulcers Psychiatry: Judgement and insight appear normal. Mood & affect appropriate.     Data Reviewed: I have personally reviewed following labs and imaging studies  CBC: Recent Labs  Lab 01/24/22 0443 01/25/22 0739 01/26/22 0608 01/27/22 0452 01/28/22 0500  WBC 5.4 5.5 6.2 6.8 7.6  NEUTROABS 3.8 3.5 4.1 4.6  --   HGB 9.1* 9.4* 9.3* 9.5* 9.5*  HCT 29.1* 30.3* 30.0* 30.2* 30.4*  MCV 85.8 86.1 85.7 86.0 85.4  PLT 146* 154 162 182 710   Basic Metabolic Panel: Recent Labs  Lab 01/22/22 0445 01/23/22 0336 01/24/22 0443 01/25/22 0739 01/27/22 0452 01/28/22 0500  NA 137 135 136 139 139 137  K 3.6 3.8 3.5 4.0 4.6 4.7  CL 109 109 109 112* 111 105  CO2 25 20* 23 23 25 28   GLUCOSE 117* 86 107* 111* 132* 121*  BUN 19 18 19 23  32* 39*  CREATININE 0.91 0.88 0.75 0.64 0.74 0.75  CALCIUM 9.2 9.5 9.4 9.6 9.5 9.7  MG 2.0 2.0 2.0 2.0  --  2.1  PHOS 2.5 2.5 2.8 3.0  --  4.3   GFR: Estimated Creatinine Clearance: 50 mL/min (by C-G formula based on SCr of 0.75 mg/dL). Liver Function Tests: Recent Labs  Lab 01/24/22 0443 01/25/22 0739 01/28/22 0500  AST 19 16 19   ALT 14 12 9   ALKPHOS 62 59 58  BILITOT 0.7 0.2* 0.5  PROT 5.8* 5.8* 6.5  ALBUMIN 2.3* 2.2* 2.4*   No results for input(s): "LIPASE", "AMYLASE" in the last 168 hours. No results for input(s): "AMMONIA" in the last 168  hours. Coagulation Profile: No results for input(s): "INR", "PROTIME" in the last 168 hours. Cardiac Enzymes: No results for input(s): "CKTOTAL", "CKMB", "CKMBINDEX", "TROPONINI" in the last 168 hours. BNP (  last 3 results) No results for input(s): "PROBNP" in the last 8760 hours. HbA1C: No results for input(s): "HGBA1C" in the last 72 hours. CBG: Recent Labs  Lab 01/27/22 2106 01/28/22 0035 01/28/22 0518 01/28/22 0940 01/28/22 1140  GLUCAP 129* 133* 126* 120* 128*   Lipid Profile: Recent Labs    01/28/22 0500  TRIG 35   Thyroid Function Tests: No results for input(s): "TSH", "T4TOTAL", "FREET4", "T3FREE", "THYROIDAB" in the last 72 hours. Anemia Panel: No results for input(s): "VITAMINB12", "FOLATE", "FERRITIN", "TIBC", "IRON", "RETICCTPCT" in the last 72 hours. Sepsis Labs: No results for input(s): "PROCALCITON", "LATICACIDVEN" in the last 168 hours.  No results found for this or any previous visit (from the past 240 hour(s)).  Radiology Studies: DG Swallowing Func-Speech Pathology  Result Date: 01/28/2022 Table formatting from the original result was not included. Images from the original result were not included. Objective Swallowing Evaluation: Type of Study: MBS-Modified Barium Swallow Study  Patient Details Name: Kristin [name] MRN: [id number] Date of Birth: 1930-04-24 Today's Date: 01/28/2022 Time: SLP Start Time (ACUTE ONLY): 70 -SLP Stop Time (ACUTE ONLY): 0998 SLP Time Calculation (min) (ACUTE ONLY): 47 min Past Medical History: Past Medical History: Diagnosis Date  Acute postoperative pain 02/09/2018  Arrhythmia, sinus node 04/03/2014  Arthritis   Arthritis   Atrial fibrillation (HCC)   Back pain   lower back chronic  Bradycardia   Breast cancer ([place]) 2004  right breast cancer  Cancer ([place]) 2004  rt breast cancer-post lumpectomy- chemo/rad  CHF (congestive heart failure) (HCC)   Chronic back pain   CKD (chronic kidney disease)   COPD (chronic obstructive pulmonary disease)  ([place])   wears O2 at 2L via [place] at night  COVID-19 06/11/2020  Cystocele   Decubitus ulcers   Dehydration   Gout   Hematuria   Hepatitis C   Hyperlipidemia   Hypertension   Hypomagnesemia 06/25/2015  NSTEMI (non-ST elevated myocardial infarction) ([place]) 04/08/2021  OAB (overactive bladder)   Overactive bladder   Overdose opiate, accidental or unintentional, sequela 10/02/2020  Restless leg   Shoulder pain, bilateral   Urinary frequency   UTI (lower urinary tract infection)   Vaginal atrophy  Past Surgical History: Past Surgical History: Procedure Laterality Date  ABDOMINAL HYSTERECTOMY    BACK SURGERY    BREAST LUMPECTOMY Right 2004  BREAST SURGERY    rt lumpectomy  CARPAL TUNNEL RELEASE    CATARACT EXTRACTION W/PHACO  10/19/2011  Procedure: CATARACT EXTRACTION PHACO AND INTRAOCULAR LENS PLACEMENT ([place]);  Surgeon: Elta Guadeloupe T. Gershon Crane, MD;  Location: [facility];  Service: Ophthalmology;  Laterality: Right;  CDE:10.81  CATARACT EXTRACTION W/PHACO  11/02/2011  Procedure: CATARACT EXTRACTION PHACO AND INTRAOCULAR LENS PLACEMENT ([facility]);  Surgeon: Elta Guadeloupe T. Gershon Crane, MD;  Location: [facility];  Service: Ophthalmology;  Laterality: Left;  CDE 8.60  CHOLECYSTECTOMY    3/18  COLON SURGERY    INSERT / REPLACE / REMOVE PACEMAKER    JOINT REPLACEMENT    bilateral TKA  LOWER EXTREMITY ANGIOGRAPHY Left 10/14/2020  Procedure: LOWER EXTREMITY ANGIOGRAPHY;  Surgeon: Katha Cabal, MD;  Location: [facility];  Service: Cardiovascular;  Laterality: Left;  LOWER EXTREMITY ANGIOGRAPHY Right 09/08/2021  Procedure: LOWER EXTREMITY ANGIOGRAPHY;  Surgeon: Katha Cabal, MD;  Location: [facility];  Service: Cardiovascular;  Laterality: Right;  PACEMAKER INSERTION N/A 12/24/2015  Procedure: INSERTION PACEMAKER;  Surgeon: Isaias Cowman, MD;  Location: [facility];  Service: Cardiovascular;  Laterality: N/A;  ROTATOR CUFF REPAIR    bilateral  VIDEO BRONCHOSCOPY WITH ENDOBRONCHIAL NAVIGATION Right 11/11/2021  Procedure: VIDEO BRONCHOSCOPY  WITH ENDOBRONCHIAL NAVIGATION;  Surgeon: Ottie Glazier, MD;  Location: [facility];  Service: Thoracic;  Laterality: Right; HPI: Per admitting H&P "MYRISSA CHIPLEY is a 88 y.o. 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  Son at bedside and contributes to history.  Patient unable to arouse to answer questions  ED course and data review: Vitals within normal limits on arrival.  ABG on 4 L shows pH 7.27 with PCO2 of 50 and PO2 of 60.  CBC unremarkable and lactic acid 1.5.  CMP significant for creatinine of 1.5 above baseline of 1.18.  Troponin 62.  COVID-negative.  EKG, personally viewed and interpreted shows sinus rhythm at 64 with no acute ST-T wave changes.  Chest x-ray shows the following:     Multifocal patchy opacities, progressive when compared to prior.  While some of this may reflect tumor in the right upper lobe,  superimposed multifocal pneumonia is suspected."  Subjective: pt confused, continuous verbalizations, unable to follow directions, very distracted  Recommendations for follow up therapy are one component of a multi-disciplinary discharge planning process, led by the attending physician.  Recommendations may be updated based on patient status, additional functional criteria and insurance authorization. Assessment / Plan / Recommendation   01/27/2022   4:00 PM Clinical Impressions Clinical Impression Pt presents with altered mentation making it difficult for pt to follow any directions or sustain attention to tasks. Pt was not appropriately responsive to spoon at her lips and allowed spoonful of thin liquids to dribble out of her mouth. A cup was placed in pt's hand to promote more  functional self-feeding response. With maximal assistnace, pt able to bring cup to her mouth for a sip. Pt presents with discoordinated timing of apnea prior to swallow initiation that ultimately results in pt desating to 73% on 5L via [place]. Pt's nurse arrived and advanced pt to 7 L via [place]. After ~ 10 minutes, pt's O2 increased but continued to be sub-optimal. This Probation officer paged pt's attending (Dr Shawna Clamp) who set parameter of 88% O2 on 7L before proceeding with any POs. Unfortunately, pt was at 82% on 7L [place]. At this time, pt has increased risk of respiratory compromise d/t inability sustain safe O2 during consumption. This appears directly related to discoordinated apnea and overall mentation. It is also concerning that most recent chest x-ray noted cardiomegaly with suspected trace pleural effusions. Suspect  superimposed mild interstitial edema on underlying chronic lung disease and heterogeneous bilateral airspace opacities appear slightly worsened in the right upper lobe and right base and may reflect atelectasis or pneumonia. At this time recommend NPO status with reconsult to ST services should pt's oxygen requirements decrease. SLP Visit Diagnosis Dysphagia, oropharyngeal phase (R13.12) Impact on safety and function Severe aspiration risk;Risk for inadequate nutrition/hydration     01/27/2022   4:00 PM Treatment Recommendations Treatment Recommendations No treatment recommended at this time     01/27/2022   4:00 PM Prognosis Prognosis for Safe Diet Advancement -- Barriers to Reach Goals Severity of deficits;Cognitive deficits;Time post onset;Behavior   01/27/2022   4:00 PM Diet Recommendations SLP Diet Recommendations NPO Medication Administration Via alternative  means     01/27/2022   4:00 PM Other Recommendations Oral Care Recommendations Oral care QID Follow Up Recommendations --   01/23/2022  10:00 AM Frequency and Duration  Speech Therapy Frequency (ACUTE ONLY) min 5x/week Treatment Duration 1 week      No  data to display        No data to display        No data to display    Happi Rutherford Nail 01/28/2022, 7:49 AM                      Scheduled Meds:  apixaban  2.5 mg Oral BID   vitamin C  500 mg Oral BID   aspirin  81 mg Oral Daily   chlorhexidine  15 mL Mouth Rinse BID   Chlorhexidine Gluconate Cloth  6 each Topical Daily   feeding supplement (NEPRO CARB STEADY)  237 mL Oral TID BM   furosemide  40 mg Oral Daily   insulin aspart  0-9 Units Subcutaneous Q8H   ipratropium-albuterol  3 mL Nebulization TID   lidocaine  1 patch Transdermal Q24H   mouth rinse  15 mL Mouth Rinse q12n4p   melatonin  10 mg Oral QHS   mometasone-formoterol  2 puff Inhalation BID   And   umeclidinium bromide  1 puff Inhalation Daily   pantoprazole  40 mg Oral QHS   rOPINIRole  4 mg Oral QHS   sodium chloride flush  10-40 mL Intracatheter Q12H   Continuous Infusions:  sodium chloride Stopped (01/17/22 1742)   sodium chloride     cefTRIAXone (ROCEPHIN)  IV     TPN ADULT (ION) 70 mL/hr at 01/27/22 1736   TPN ADULT (ION)       LOS: 11 days    Time spent: 35 mins    Wayland Baik, MD [facility]   If 7PM-7AM, please contact night-coverage

## 2022-01-28 NOTE — Procedures (Signed)
Objective Swallowing Evaluation: Type of Study: Bedside Swallow Evaluation   Patient Details  Name: Kristin [name] MRN: [id number] Date of Birth: 1929/10/30  Today's Date: 01/28/2022 Time: SLP Start Time (ACUTE ONLY): 2423 -SLP Stop Time (ACUTE ONLY): 1055  SLP Time Calculation (min) (ACUTE ONLY): 40 min   Past Medical History:  Past Medical History:  Diagnosis Date   Acute postoperative pain 02/09/2018   Arrhythmia, sinus node 04/03/2014   Arthritis    Arthritis    Atrial fibrillation (HCC)    Back pain    lower back chronic   Bradycardia    Breast cancer ([place]) 2004   right breast cancer   Cancer ([place]) 2004   rt breast cancer-post lumpectomy- chemo/rad   CHF (congestive heart failure) (HCC)    Chronic back pain    CKD (chronic kidney disease)    COPD (chronic obstructive pulmonary disease) ([place])    wears O2 at 2L via [place] at night   COVID-19 06/11/2020   Cystocele    Decubitus ulcers    Dehydration    Gout    Hematuria    Hepatitis C    Hyperlipidemia    Hypertension    Hypomagnesemia 06/25/2015   NSTEMI (non-ST elevated myocardial infarction) ([place]) 04/08/2021   OAB (overactive bladder)    Overactive bladder    Overdose opiate, accidental or unintentional, sequela 10/02/2020   Restless leg    Shoulder pain, bilateral    Urinary frequency    UTI (lower urinary tract infection)    Vaginal atrophy    Past Surgical History:  Past Surgical History:  Procedure Laterality Date   ABDOMINAL HYSTERECTOMY     BACK SURGERY     BREAST LUMPECTOMY Right 2004   BREAST SURGERY     rt lumpectomy   CARPAL TUNNEL RELEASE     CATARACT EXTRACTION W/PHACO  10/19/2011   Procedure: CATARACT EXTRACTION PHACO AND INTRAOCULAR LENS PLACEMENT ([place]);  Surgeon: Elta Guadeloupe T. Gershon Crane, MD;  Location: [facility];  Service: Ophthalmology;  Laterality: Right;  CDE:10.81   CATARACT EXTRACTION W/PHACO  11/02/2011   Procedure: CATARACT EXTRACTION PHACO AND INTRAOCULAR LENS PLACEMENT ([facility]);  Surgeon: Elta Guadeloupe T.  Gershon Crane, MD;  Location: [facility];  Service: Ophthalmology;  Laterality: Left;  CDE 8.60   CHOLECYSTECTOMY     3/18   COLON SURGERY     INSERT / REPLACE / REMOVE PACEMAKER     JOINT REPLACEMENT     bilateral TKA   LOWER EXTREMITY ANGIOGRAPHY Left 10/14/2020   Procedure: LOWER EXTREMITY ANGIOGRAPHY;  Surgeon: Katha Cabal, MD;  Location: [place] [facility];  Service: Cardiovascular;  Laterality: Left;   LOWER EXTREMITY ANGIOGRAPHY Right 09/08/2021   Procedure: LOWER EXTREMITY ANGIOGRAPHY;  Surgeon: Katha Cabal, MD;  Location: [facility] [facility];  Service: Cardiovascular;  Laterality: Right;   PACEMAKER INSERTION N/A 12/24/2015   Procedure: INSERTION PACEMAKER;  Surgeon: Isaias Cowman, MD;  Location: [facility];  Service: Cardiovascular;  Laterality: N/A;   ROTATOR CUFF REPAIR     bilateral   VIDEO BRONCHOSCOPY WITH ENDOBRONCHIAL NAVIGATION Right 11/11/2021   Procedure: VIDEO BRONCHOSCOPY WITH ENDOBRONCHIAL NAVIGATION;  Surgeon: Ottie Glazier, MD;  Location: [facility];  Service: Thoracic;  Laterality: Right;   HPI: Per admitting H&P "[name] is a 88 y.o. 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  Son at bedside and contributes to history.  Patient unable to arouse to answer questions  ED course and data review: Vitals within normal limits on arrival.  ABG on 4 L shows pH 7.27 with PCO2 of 50 and PO2 of 60.  CBC unremarkable and lactic acid 1.5.  CMP significant for creatinine of 1.5 above baseline of 1.18.  Troponin 62.  COVID-negative.  EKG, personally viewed and interpreted shows sinus rhythm at 64 with no acute ST-T wave changes.  Chest x-ray shows the  following:  IMPRESSION:  Multifocal patchy opacities, progressive when compared to prior.  While some of this may reflect tumor in the right upper lobe,  superimposed multifocal pneumonia is suspected."   Subjective: Pt alert, confusion evident. Limited and very inconsistent ability to follow directions. On 5L/min O2 via [place].    Recommendations for follow up therapy are one component of a multi-disciplinary discharge planning process, led by the attending physician.  Recommendations may be updated based on patient status, additional functional criteria and insurance authorization.  Assessment / Plan / Recommendation     01/28/2022    1:31 PM  Clinical Impressions  Clinical Impression Pt seen for repeat MBSS. Pt alert, confusion evident. Limited and very inconsistent ability to follow directions. On 5L/min O2 via [place].  Pt presents with a severe oropharyngeal dysphagia. Pt given trials of thin liquids (via teaspoon and cup sip), nectar-thick liquids (via tsp and cup sip), and pureed. Solid not assessed due to severity of oropharyngeal dysphagia. Oral deficits c/b oral holding and delayed oral transit across all textures, piecemeal swallow with cup sip of nectar-thick liquids, and pre-swallow loss of pudding with significant pharyngeal pooling. Oral deficits secondary to lingual weakness/incoordination and likely exacerbated by AMS. Pharyngeal deficits c/b swallow initiation at the level of the vallecula with all textures except puree where pharyngeal swallow was initiated at the level of the pyriform sinus after significant pharyngeal pooling, trace-severe vallecular stasis which was most appreciated with pudding, trace-mild pyriform sinus residual across all textures, and mild posterior pharyngeal wall residual with pudding. Additionally, pt with during the swallow laryngeal penetration above the vocal cords and after the swallow penetration to the level of the true vocal cords across all textures; pt with  after the swallow aspiration of thin liquids via teaspoon when used as liquid wash. Pt with inconsistent and weak throat clear response to penetration/aspiration. Pharyngeal deficits secondary to reduced base of tongue retraction, reduced hyolaryngeal elevation, reduced pharyngeal constriction, reduced laryngeal vestibule closure, reduced pharyngeal and laryngeal sensation, and weak/ineffective throat clearing response.   Attempted compensatory strategies of use of secondry swallow to aid in pharyngeal clearance, liquid wash to aid in pharyngeal clearance, use of cued throat clear/cough to clear penetration/aspiration,. Pt unable to follow commands for secondary swallows or cued throat clear/cough. Teaspoon of thin liquid wash resulted in after the swallow aspiration of thin liquids which were presumed to be mixed with pudding residual.   A safe oral diet cannot be recommended at this time given severity of pt's oropharyngeal dysphagia and overall pattern of pt's swallowing physiology.  Recommend NPO with GOC discussion regarding comfort diet recommendations vs long term alternate means of nutrition/hydration/medication. Regardless, of means of nutrition/hydration/medication, pt remains at high risk for aspiration/aspiration PNA given dental status, mental status, and multiple medical comorbidities. Presence of long term alternate route of nutrition/hydration/medication does  not eliminate risk of aspiration/aspiration PNA.  RN and MD made aware of results, recommendations, and SLP POC.   SLP to sign off as pt has no further SLP needs at this time.         01/28/2022    1:16 PM  Treatment Recommendations  Treatment Recommendations No treatment recommended at this time        01/28/2022    1:17 PM  Prognosis  Prognosis for Safe Diet Advancement Guarded  Barriers to Reach Goals Severity of deficits;Cognitive deficits;Time post onset;Behavior       01/28/2022    1:16 PM  Diet Recommendations   SLP Diet Recommendations NPO  Medication Administration Via alternative means         01/28/2022    1:31 PM  Other Recommendations  Recommended Consults --       01/23/2022   10:00 AM  Frequency and Duration   Speech Therapy Frequency (ACUTE ONLY) min 5x/week  Treatment Duration 1 week         01/28/2022    1:21 PM  Oral Phase  Oral Phase Impaired  Oral - Nectar Teaspoon Holding of bolus;Delayed oral transit  Oral - Nectar Cup Holding of bolus;Delayed oral transit;Piecemeal swallowing  Oral - Nectar Straw NT  Oral - Thin Teaspoon Holding of bolus;Delayed oral transit  Oral - Thin Cup Holding of bolus;Delayed oral transit  Oral - Thin Straw NT  Oral - Puree Reduced posterior propulsion;Delayed oral transit;Premature spillage       01/28/2022    1:24 PM  Pharyngeal Phase  Pharyngeal Phase Impaired  Pharyngeal- Nectar Teaspoon Delayed swallow initiation-vallecula;Reduced tongue base retraction;Reduced airway/laryngeal closure;Reduced laryngeal elevation;Penetration/Aspiration during swallow;Penetration/Apiration after swallow;Pharyngeal residue - valleculae;Pharyngeal residue - pyriform  Pharyngeal Material enters airway, remains ABOVE vocal cords and not ejected out;Material enters airway, CONTACTS cords and not ejected out  Pharyngeal- Nectar Cup Delayed swallow initiation-vallecula;Reduced tongue base retraction;Reduced airway/laryngeal closure;Reduced laryngeal elevation;Penetration/Aspiration during swallow;Penetration/Apiration after swallow;Pharyngeal residue - valleculae;Pharyngeal residue - pyriform  Pharyngeal Material enters airway, remains ABOVE vocal cords and not ejected out;Material enters airway, CONTACTS cords and not ejected out  Pharyngeal- Nectar Straw NT  Pharyngeal- Thin Teaspoon Delayed swallow initiation-vallecula;Reduced tongue base retraction;Reduced airway/laryngeal closure;Penetration/Aspiration during swallow;Penetration/Apiration after swallow;Trace  aspiration;Moderate aspiration;Pharyngeal residue - valleculae;Pharyngeal residue - pyriform  Pharyngeal Material does not enter airway;Material enters airway, remains ABOVE vocal cords and not ejected out;Material enters airway, CONTACTS cords and not ejected out;Material enters airway, passes BELOW cords then ejected out  Pharyngeal- Thin Cup Delayed swallow initiation-vallecula;Reduced tongue base retraction;Reduced laryngeal elevation;Reduced airway/laryngeal closure;Penetration/Aspiration during swallow;Penetration/Apiration after swallow;Pharyngeal residue - valleculae;Pharyngeal residue - pyriform  Pharyngeal Material enters airway, remains ABOVE vocal cords and not ejected out;Material enters airway, CONTACTS cords and not ejected out  Pharyngeal- Thin Straw NT  Pharyngeal- Puree Delayed swallow initiation-pyriform sinuses;Reduced tongue base retraction;Reduced airway/laryngeal closure;Reduced laryngeal elevation;Reduced pharyngeal peristalsis;Penetration/Aspiration during swallow;Penetration/Apiration after swallow;Pharyngeal residue - valleculae;Pharyngeal residue - pyriform;Pharyngeal residue - posterior pharnyx  Pharyngeal Material enters airway, remains ABOVE vocal cords and not ejected out;Material enters airway, CONTACTS cords and not ejected out  Pharyngeal Comment pt with inconsistent and weak throat clear response to penetration/aspiration; pt unable to follow commands for secondary swallows or cued throat clear/cough; liquid wash resulted in after the swallow aspiration of thin liquids which were presumed to be mixed with pudding residual        01/28/2022    1:20 PM  Cervical Esophageal Phase   Cervical Esophageal Phase [facility]    Cherrie Gauze,  M.S., [facility] [phone] ([place])   Quintella Baton 01/28/2022, 1:48 PM

## 2022-01-28 NOTE — Plan of Care (Signed)
  Problem: Activity: Goal: Ability to tolerate increased activity will improve Outcome: Progressing   Problem: Clinical Measurements: Goal: Ability to maintain a body temperature in the normal range will improve Outcome: Progressing   Problem: Respiratory: Goal: Ability to maintain adequate ventilation will improve Outcome: Progressing Goal: Ability to maintain a clear airway will improve Outcome: Progressing   Problem: Education: Goal: Knowledge of General Education information will improve Description: Including pain rating scale, medication(s)/side effects and non-pharmacologic comfort measures Outcome: Progressing   Problem: Health Behavior/Discharge Planning: Goal: Ability to manage health-related needs will improve Outcome: Progressing   Problem: Clinical Measurements: Goal: Ability to maintain clinical measurements within normal limits will improve Outcome: Progressing Goal: Will remain free from infection Outcome: Progressing Goal: Diagnostic test results will improve Outcome: Progressing Goal: Respiratory complications will improve Outcome: Progressing Goal: Cardiovascular complication will be avoided Outcome: Progressing   Problem: Activity: Goal: Risk for activity intolerance will decrease Outcome: Progressing   Problem: Nutrition: Goal: Adequate nutrition will be maintained Outcome: Progressing   Problem: Coping: Goal: Level of anxiety will decrease Outcome: Progressing   Problem: Elimination: Goal: Will not experience complications related to bowel motility Outcome: Progressing Goal: Will not experience complications related to urinary retention Outcome: Progressing   Problem: Pain Managment: Goal: General experience of comfort will improve Outcome: Progressing   Problem: Safety: Goal: Ability to remain free from injury will improve Outcome: Progressing   Problem: Skin Integrity: Goal: Risk for impaired skin integrity will decrease Outcome:  Progressing   Problem: Education: Goal: Ability to describe self-care measures that may prevent or decrease complications (Diabetes Survival Skills Education) will improve Outcome: Progressing Goal: Individualized Educational Video(s) Outcome: Progressing   Problem: Coping: Goal: Ability to adjust to condition or change in health will improve Outcome: Progressing   Problem: Fluid Volume: Goal: Ability to maintain a balanced intake and output will improve Outcome: Progressing   Problem: Health Behavior/Discharge Planning: Goal: Ability to identify and utilize available resources and services will improve Outcome: Progressing Goal: Ability to manage health-related needs will improve Outcome: Progressing   Problem: Metabolic: Goal: Ability to maintain appropriate glucose levels will improve Outcome: Progressing   Problem: Nutritional: Goal: Maintenance of adequate nutrition will improve Outcome: Progressing Goal: Progress toward achieving an optimal weight will improve Outcome: Progressing   Problem: Skin Integrity: Goal: Risk for impaired skin integrity will decrease Outcome: Progressing   Problem: Tissue Perfusion: Goal: Adequacy of tissue perfusion will improve Outcome: Progressing

## 2022-01-28 NOTE — Consult Note (Signed)
PHARMACY - TOTAL PARENTERAL NUTRITION CONSULT NOTE   Indication:  Prolonged NPO and inability to place enteral access for nutrition  Patient Measurements: Height: 5' 7.01" (170.2 cm) Weight: 77 kg (169 lb 12.1 oz) IBW/kg (Calculated) : 61.62   Body mass index is 26.58 kg/m.  Assessment:  Patient is a 88 y/o F with medical history including Afib on apixaban, breast cancer in 2004, HFpEF, COPD, gout, hepatitis C, HLD, HTN, NSTEMI, lung cancer who is admitted 5/27 with acute respiratory failure in setting of aspiration / acute encephalopathy. Patient was intubated 5/28 thru 6/1. SLP consulted and is recommending NPO except medications. Attempts to place NG tube at bedside have been unsuccessful. Further options for enteral access are being discussed. In the meantime, pharmacy has been consulted to initiate TPN to provide nutrition.  Glucose / Insulin: Last Hgb A1c was 6.1% on 11/05/21. Patient has been normoglycemic. 2 unit(s) of SSI required in the last 24 hours Electrolytes: No abnormalities at present. Phos trending up to ULN Renal: Scr < 1 Hepatic: LFTs within normal limits Intake / Output; MIVF: I&O -800 mL for the admission; UOP 0.9>1.7 mL/k/h GI Imaging: N/A GI Surgeries / Procedures: N/A  Central access: PICC placed 5/28 TPN start date: 6/3  Nutritional Goals: Goal TPN rate is 70 mL/hr (provides 90 g of protein and 1830 kcals per day)  RD Assessment: Estimated Needs Total Energy Estimated Needs: 1750-1950 Total Protein Estimated Needs: 85-100 grams Total Fluid Estimated Needs: > 1.7 L  Current Nutrition:  NPO  Plan:  --Continue TPN at 70 mL/hr at 1800 6/05 (Full goal rate) --Electrolytes in TPN: Na 55mEq/L, K 66mEq/L, Ca 55mEq/L, Mg 28mEq/L, and Phos 52mmol/L (decreased from 2mmol/L). Cl:Ac 1:1 --Add standard MVI and trace elements to TPN --Continue Sensitive q8h SSI and adjust as needed; can consider discontinuation if patient tolerates full rate TPN x 48 hours without  insulin requirement  --Monitor TPN labs on Mon/Thurs, daily until stable  Darnelle Bos, PharmD 01/28/2022 11:30 AM

## 2022-01-28 NOTE — Progress Notes (Signed)
Nutrition Follow-up  DOCUMENTATION CODES:   Non-severe (moderate) malnutrition in context of chronic illness  INTERVENTION:   -TPN management per pharmacy -D/c Nepro -D/c Vitamin C -If pt continues with NPO status and/or adequate oral intake, consider enteral nutrition support:   Initiate Osmolite 1.2 @ 25 ml/hr and increase by 10 ml every 4 hours to goal rate of 65 ml/hr.   75 ml free water flush every 4 hours  Tube feeding regimen provides 1729 kcal (100% of needs), 87 grams of protein, and 1279 ml of H2O.  Total free water: 1729 ml daily  NUTRITION DIAGNOSIS:   Moderate Malnutrition related to chronic illness (COPD) as evidenced by mild fat depletion, moderate fat depletion, mild muscle depletion, moderate muscle depletion.  Ongoing  GOAL:   Patient will meet greater than or equal to 90% of their needs  Met with TPN  MONITOR:   Labs, Weight trends, Skin, I & O's, Other (Comment) (TPN)  REASON FOR ASSESSMENT:   Ventilator    ASSESSMENT:   88 y/o female with h/o breast cancer s/p lumpectomy/chemo/XRT, CHF, CKD III, Afib, COPD, lung cancer, pacemaker, OVD, DDD, gout, GERD, HTN and COVID 19 (2021) who is admitted with acute hypoxic / hypercapnic respiratory failure secondary to suspected aspiration in the setting of possible CAP & acute HFpEF exacerbation, AKI and encephalopathy.  6/3- s/p PICC, TPN initiated 6/4- s/p BSE- advanced to dysphagia 1 diet with nectar thick liquids 6/7- s/p BSE- advanced to dysphagia 3 diet with nectar thick liquids   6/8- s/p MBSS- recommend NPO   Reviewed I/O's: -3.1 L x 24 hours and -2.1 L since admission  UOP: 3.1 L x 24 hours  Case discussed with MD; pt now NPO. RD discussed possibility of placing g-tube now that pt is NPO. MD plans to talk with family today regarding goals of care. Per previous discussions with family, pt continues to desire aggressive care.   Per pharmacy notes, pt receiving TPN at goal rate of 70 ml/hr, which  provides 1830 kcals and 91 grams protein, meeting 100% of estimated kcal and protein needs. Pt started on TPN due to inability to place NGT.    Medications reviewed and include vitamin C, lasix, and melatonin.   Labs reviewed: CBGS: [id number] (inpatient orders for glycemic control are 0-9 units insulin aspart every 8 hours).    Diet Order:   Diet Order             Diet NPO time specified  Diet effective now                   EDUCATION NEEDS:   Not appropriate for education at this time  Skin:  Skin Assessment: Skin Integrity Issues: Skin Integrity Issues:: Stage II Stage II: rt coccyx, lt coccyx, lower medial sacrum  Last BM:  01/26/22  Height:   Ht Readings from Last 1 Encounters:  01/21/22 5' 7.01" (1.702 m)    Weight:   Wt Readings from Last 1 Encounters:  01/27/22 77 kg    Ideal Body Weight:  61.4 kg  BMI:  Body mass index is 26.58 kg/m.  Estimated Nutritional Needs:   Kcal:  7867-5449  Protein:  85-100 grams  Fluid:  > 1.7 L    Loistine Chance, RD, LDN, [place] Registered Dietitian II Certified Diabetes Care and Education Specialist Please refer to [facility] for RD and/or RD on-call/weekend/after hours pager

## 2022-01-28 NOTE — Progress Notes (Signed)
SLP Follow Up Note  Patient Details Name: Kristin [name] MRN: [id number] DOB: Dec 28, 1929  Per chart and in discussion with pt's nurse, pt's O2 has been stable. Will re-attempt instrumental swallow study at next available time.   Tashawn Greff B. Rutherford Nail, M.S., CCC-SLP, [place] Pathologist Certified Brain Injury [place]  [facility] [phone] Ascom [phone] Fax [phone]   Stormy Fabian 01/28/2022, 9:39 AM

## 2022-01-28 NOTE — Progress Notes (Signed)
NAME:  Kristin [name], MRN:  [id number], DOB:  09/24/1929, LOS: 13 ADMISSION DATE:  01/16/2022, CONSULTATION DATE: 01/17/2022 REFERRING MD: Dr. Damita Dunnings, CHIEF COMPLAINT: Weakness  History of Present Illness:  88 year old female presenting to [facility] ED from home via EMS on 01/16/2022 with complaints of lethargy and weakness.  Per the patient's granddaughter bedside, [name], patient was in her normal state of health until earlier in the evening on 01/16/2022 when she picked up the phone but did not respond.  Patient's granddaughter lives next door and walked over to check on her finding her sitting at the kitchen table with her head on the table.  She was responsive and able to talk but very weak.  Only other thing family noticed was that the patient was sleeping more than usual.  No other complaints that family was aware of.  01/28/22- patient seen at bedside interval pulmonary follow up post acute hypoxemic resp failure from ICU.  Shes on 5L/min stable but still with some confusion.    Pertinent  Medical History  Atrial fibrillation on Eliquis Right breast cancer 2004 HFpEF COPD COVID-19 06/11/2020 Gout Hepatitis C Hyperlipidemia Hypertension NSTEMI 04/08/2021 SCC lung status post palliative radiation SBRT x5 fractions PPM in place Significant Hospital Events: Including procedures, antibiotic start and stop dates in addition to other pertinent events   01/17/2022: Admit to ICU with acute hypoxic respiratory failure secondary to suspected aspiration in the setting of acute encephalopathy    Objective   Blood pressure (!) 127/57, pulse 70, temperature 97.7 F (36.5 C), resp. rate 18, height 5' 7.01" (1.702 m), weight 77 kg, SpO2 91 %.        Intake/Output Summary (Last 24 hours) at 01/28/2022 1446 Last data filed at 01/28/2022 1132 Gross per 24 hour  Intake 10 ml  Output 3100 ml  Net -3090 ml    Filed Weights   01/23/22 0600 01/25/22 0500 01/27/22 0509  Weight: 77.8 kg 77.3 kg 77 kg     Examination: General: Adult female, lying in bed, NAD HEENT: MM pink/moist, anicteric, atraumatic, neck supple Neuro: no FND clinically , GCS 12 CV: s1s2 RRR, NSR paced rhythm on monitor, no r/m/g Pulm: Regular, non labored on 5L/min, breath sounds coarse crackles-BUL & diminished-BLL GI: soft, rounded, non tender, bs x 4 Skin: Limited exam- no rashes/lesions noted Extremities: warm/dry, pulses + 2 R/P, +2 edema noted BLE  Resolved Hospital Problem list     Assessment & Plan:    Acute Hypoxic / Hypercapnic Respiratory Failure  -secondary to suspected aspiration in the setting of possible CAP & acute HFpEF exacerbation  -improved on nasal canula 5l/min now doing well  2 S/p acute on Chronic HFpEF exacerbation -had mild Elevated Troponin secondary to suspected demand ischemia Chronic / Paroxysmal Atrial Fibrillation - Continuous cardiac monitoring  - Daily weights to assess volume status - Diurese with the use of IV lasix, as hemodynamics and renal function permit -Continue outpatient Eliquis  Acute Kidney Injury superimposed on CKD stage 3b  Baseline Cr: 1.18, Cr on admission:1.50 - Strict I/O's: alert provider if UOP < 0.5 mL/kg/hr - Daily BMP, replace electrolytes PRN - Avoid nephrotoxic agents as able, ensure adequate renal perfusion  Encephalopathy -suspect post sedation and toxic metablic -S/p head without contrast - s/p ammonia level -s/p neurology evaluation  -Avoid sedating medications as able - supportive care  Best Practice (right click and "Reselect all SmartList Selections" daily)  Diet/type: NPO w/ meds via tube DVT prophylaxis: DOAC GI prophylaxis: PPI Lines:  N/A Foley:  Yes, and it is still needed Code Status:  full code Last date of multidisciplinary goals of care discussion [01/16/22] Updated granddaughter, HCPOA, via telephone.  Questions and concerns answered at this time.  Labs   CBC: Recent Labs  Lab 01/24/22 0443 01/25/22 0739  01/26/22 0608 01/27/22 0452 01/28/22 0500  WBC 5.4 5.5 6.2 6.8 7.6  NEUTROABS 3.8 3.5 4.1 4.6  --   HGB 9.1* 9.4* 9.3* 9.5* 9.5*  HCT 29.1* 30.3* 30.0* 30.2* 30.4*  MCV 85.8 86.1 85.7 86.0 85.4  PLT 146* 154 162 182 204     Basic Metabolic Panel: Recent Labs  Lab 01/22/22 0445 01/23/22 0336 01/24/22 0443 01/25/22 0739 01/27/22 0452 01/28/22 0500  NA 137 135 136 139 139 137  K 3.6 3.8 3.5 4.0 4.6 4.7  CL 109 109 109 112* 111 105  CO2 25 20* 23 23 25 28   GLUCOSE 117* 86 107* 111* 132* 121*  BUN 19 18 19 23  32* 39*  CREATININE 0.91 0.88 0.75 0.64 0.74 0.75  CALCIUM 9.2 9.5 9.4 9.6 9.5 9.7  MG 2.0 2.0 2.0 2.0  --  2.1  PHOS 2.5 2.5 2.8 3.0  --  4.3    GFR: Estimated Creatinine Clearance: 50 mL/min (by C-G formula based on SCr of 0.75 mg/dL). Recent Labs  Lab 01/25/22 0739 01/26/22 0608 01/27/22 0452 01/28/22 0500  WBC 5.5 6.2 6.8 7.6     Liver Function Tests: Recent Labs  Lab 01/24/22 0443 01/25/22 0739 01/28/22 0500  AST 19 16 19   ALT 14 12 9   ALKPHOS 62 59 58  BILITOT 0.7 0.2* 0.5  PROT 5.8* 5.8* 6.5  ALBUMIN 2.3* 2.2* 2.4*    No results for input(s): "LIPASE", "AMYLASE" in the last 168 hours. No results for input(s): "AMMONIA" in the last 168 hours.   ABG    Component Value Date/Time   PHART 7.47 (H) 01/22/2022 1139   PCO2ART 31 (L) 01/22/2022 1139   PO2ART 53 (L) 01/22/2022 1139   HCO3 22.6 01/22/2022 1139   ACIDBASEDEF 0.2 01/22/2022 1139   O2SAT 87.5 01/22/2022 1139      Coagulation Profile: No results for input(s): "INR", "PROTIME" in the last 168 hours.   Cardiac Enzymes: No results for input(s): "CKTOTAL", "CKMB", "CKMBINDEX", "TROPONINI" in the last 168 hours.  HbA1C: Hgb A1c MFr Bld  Date/Time Value Ref Range Status  11/05/2021 02:03 PM 6.1 (H) 4.8 - 5.6 % Final    Comment:    (NOTE)         Prediabetes: 5.7 - 6.4         Diabetes: >6.4         Glycemic control for adults with diabetes: <7.0   04/01/2020 08:07 AM 5.5  <5.7 % of total Hgb Final    Comment:    For the purpose of screening for the presence of diabetes: . <5.7%       Consistent with the absence of diabetes 5.7-6.4%    Consistent with increased risk for diabetes             (prediabetes) > or =6.5%  Consistent with diabetes . This assay result is consistent with a decreased risk of diabetes. . Currently, no consensus exists regarding use of hemoglobin A1c for diagnosis of diabetes in children. . According to American Diabetes Association (ADA) guidelines, hemoglobin A1c <7.0% represents optimal control in non-pregnant diabetic patients. Different metrics may apply to specific patient populations.  Standards of Medical Care in Diabetes(ADA). Marland Kitchen  CBG: Recent Labs  Lab 01/27/22 2106 01/28/22 0035 01/28/22 0518 01/28/22 0940 01/28/22 1140  GLUCAP 129* 133* 126* 120* 128*     Review of Systems:   Patient denies 10 point ROS   Past Medical History:  She,  has a past medical history of Acute postoperative pain (02/09/2018), Arrhythmia, sinus node (04/03/2014), Arthritis, Arthritis, Atrial fibrillation ([place]), Back pain, Bradycardia, Breast cancer ([place]) (2004), Cancer ([place]) (2004), CHF (congestive heart failure) ([place]), Chronic back pain, CKD (chronic kidney disease), COPD (chronic obstructive pulmonary disease) ([place]), COVID-19 (06/11/2020), Cystocele, Decubitus ulcers, Dehydration, Gout, Hematuria, Hepatitis C, Hyperlipidemia, Hypertension, Hypomagnesemia (06/25/2015), NSTEMI (non-ST elevated myocardial infarction) ([place]) (04/08/2021), OAB (overactive bladder), Overactive bladder, Overdose opiate, accidental or unintentional, sequela (10/02/2020), Restless leg, Shoulder pain, bilateral, Urinary frequency, UTI (lower urinary tract infection), and Vaginal atrophy.   Surgical History:   Past Surgical History:  Procedure Laterality Date   ABDOMINAL HYSTERECTOMY     BACK SURGERY     BREAST LUMPECTOMY Right 2004   BREAST SURGERY     rt  lumpectomy   CARPAL TUNNEL RELEASE     CATARACT EXTRACTION W/PHACO  10/19/2011   Procedure: CATARACT EXTRACTION PHACO AND INTRAOCULAR LENS PLACEMENT (IOC);  Surgeon: Elta Guadeloupe T. Gershon Crane, MD;  Location: [facility];  Service: Ophthalmology;  Laterality: Right;  CDE:10.81   CATARACT EXTRACTION W/PHACO  11/02/2011   Procedure: CATARACT EXTRACTION PHACO AND INTRAOCULAR LENS PLACEMENT (IOC);  Surgeon: Elta Guadeloupe T. Gershon Crane, MD;  Location: [facility];  Service: Ophthalmology;  Laterality: Left;  CDE 8.60   CHOLECYSTECTOMY     3/18   COLON SURGERY     INSERT / REPLACE / REMOVE PACEMAKER     JOINT REPLACEMENT     bilateral TKA   LOWER EXTREMITY ANGIOGRAPHY Left 10/14/2020   Procedure: LOWER EXTREMITY ANGIOGRAPHY;  Surgeon: Katha Cabal, MD;  Location: [place] [facility];  Service: Cardiovascular;  Laterality: Left;   LOWER EXTREMITY ANGIOGRAPHY Right 09/08/2021   Procedure: LOWER EXTREMITY ANGIOGRAPHY;  Surgeon: Katha Cabal, MD;  Location: [place] [facility];  Service: Cardiovascular;  Laterality: Right;   PACEMAKER INSERTION N/A 12/24/2015   Procedure: INSERTION PACEMAKER;  Surgeon: Isaias Cowman, MD;  Location: [facility];  Service: Cardiovascular;  Laterality: N/A;   ROTATOR CUFF REPAIR     bilateral   VIDEO BRONCHOSCOPY WITH ENDOBRONCHIAL NAVIGATION Right 11/11/2021   Procedure: VIDEO BRONCHOSCOPY WITH ENDOBRONCHIAL NAVIGATION;  Surgeon: Ottie Glazier, MD;  Location: [facility] ORS;  Service: Thoracic;  Laterality: Right;     Social History:   reports that she quit smoking about 23 years ago. Her smoking use included cigarettes. She has a 50.00 pack-year smoking history. She has never used smokeless tobacco. She reports current drug use. Drug: Oxycodone. She reports that she does not drink alcohol.   Family History:  Her family history includes Breast cancer in her paternal aunt; Breast cancer (age of onset: 52) in her sister; Cancer in her sister; Heart disease in her father and mother; Kidney  disease in her brother; Stroke in her mother. There is no history of Anesthesia problems, Hypotension, Malignant hyperthermia, or Pseudochol deficiency.   Allergies Allergies  Allergen Reactions   Flexeril [Cyclobenzaprine Hcl]     Severe Rash   Vancomycin Rash    [place] Syndrome     Home Medications  Prior to Admission medications   Medication Sig Start Date End Date Taking? Authorizing Provider  acetaminophen (TYLENOL) 500 MG tablet Take 500 mg by mouth in the morning and at bedtime.  Yes [provider]  apixaban (ELIQUIS) 2.5 MG TABS tablet Take 1 tablet (2.5 mg total) by mouth 2 (two) times daily. 04/09/21  Yes Wieting, [name], MD  atorvastatin (LIPITOR) 20 MG tablet TAKE 1 TABLET BY MOUTH ONCE DAILY FOR CHOLESTEROL Patient taking differently: Take 20 mg by mouth daily. 02/17/21  Yes Karamalegos, Alexander J, DO  BREZTRI AEROSPHERE 160-9-4.8 MCG/ACT AERO Inhale 2 puffs into the lungs 2 (two) times daily. 07/07/21  Yes Karamalegos, Devonne Doughty, DO  Cholecalciferol (VITAMIN D) 2000 units tablet Take 2,000 Units by mouth daily.    Yes [provider]  empagliflozin (JARDIANCE) 10 MG TABS tablet Take 1 tablet (10 mg total) by mouth daily before breakfast. 12/29/21  Yes [name] A, FNP  furosemide (LASIX) 40 MG tablet Take 1 tablet (40 mg total) by mouth daily. 09/23/21  Yes Nicole Kindred A, DO  gabapentin (NEURONTIN) 300 MG capsule Take 1 capsule (300 mg total) by mouth 3 (three) times daily. 11/12/21  Yes Annita Brod, MD  GNP ASPIRIN LOW DOSE 81 MG EC tablet TAKE 1 TABLET BY MOUTH ONCE DAILY. *SWALLOW WHOLE* 08/18/21  Yes Schnier, Dolores Lory, MD  levocetirizine (XYZAL) 5 MG tablet TAKE (1/2) TABLET BY MOUTH EVERY OTHER DAY. 06/19/21  Yes Karamalegos, Devonne Doughty, DO  Melatonin 10 MG TABS Take 10 mg by mouth at bedtime.   Yes [provider]  Multiple Vitamin (MULTIVITAMIN WITH MINERALS) TABS tablet Take 1 tablet by mouth daily. 11/13/21  Yes Annita Brod, MD  oxyCODONE (OXY IR/ROXICODONE) 5 MG immediate release tablet Take 2 tablets (10 mg total) by mouth 2 (two) times daily. May also take 1 tablet (5 mg total) at bedtime as needed for severe pain. Must last 30 days.. 12/23/21 01/22/22 Yes Milinda Pointer, MD  rOPINIRole (REQUIP) 2 MG tablet TAKE 2 TABLETS BY MOUTH AT BEDTIME 10/21/21  Yes Karamalegos, Alexander J, DO  albuterol (PROVENTIL) (2.5 MG/3ML) 0.083% nebulizer solution USE 1 VIAL IN NEBULIZER EVERY 8 HOURS AS NEEDED FOR WHEEZING OR SHORTNESS OF BREATH. 12/22/21   Karamalegos, Devonne Doughty, DO  bisacodyl (DULCOLAX) 5 MG EC tablet Take 2 tablets (10 mg total) by mouth daily as needed for moderate constipation. 07/04/20   Karamalegos, Devonne Doughty, DO  diclofenac Sodium (VOLTAREN) 1 % GEL Apply 4 g topically 2 (two) times daily as needed (As needed for pain). 10/15/21   Kris Hartmann, NP  feeding supplement (ENSURE ENLIVE / ENSURE PLUS) LIQD Take 237 mLs by mouth 2 (two) times daily between meals. 11/13/21   Annita Brod, MD  oxyCODONE (OXY IR/ROXICODONE) 5 MG immediate release tablet Take 2 tablets (10 mg total) by mouth 2 (two) times daily. May also take 1 tablet (5 mg total) at bedtime as needed for severe pain. Must last 30 days.. 01/22/22 02/21/22  Milinda Pointer, MD  oxyCODONE (OXY IR/ROXICODONE) 5 MG immediate release tablet Take 2 tablets (10 mg total) by mouth 2 (two) times daily. May also take 1 tablet (5 mg total) at bedtime as needed for severe pain. Must last 30 days.. 02/21/22 03/23/22  Milinda Pointer, MD  OXYGEN Inhale 2 L into the lungs daily.    [provider]    Ottie Glazier, M.D.  Pulmonary & Critical Care Medicine

## 2022-01-28 NOTE — Progress Notes (Signed)
PT Cancellation Note  Patient Details Name: Kristin [name] MRN: [id number] DOB: October 01, 1929   Cancelled Treatment:     Pt/family just had a palliative consult. Requesting to hold PT this afternoon. Will continue to follow and progress as able per pt tolerance. Will update care plan if GOC changes.    Willette Pa 01/28/2022, 3:33 PM

## 2022-01-29 DIAGNOSIS — Z7189 Other specified counseling: Secondary | ICD-10-CM | POA: Diagnosis not present

## 2022-01-29 DIAGNOSIS — J189 Pneumonia, unspecified organism: Secondary | ICD-10-CM | POA: Diagnosis not present

## 2022-01-29 LAB — BASIC METABOLIC PANEL
Anion gap: 6 (ref 5–15)
BUN: 50 mg/dL — ABNORMAL HIGH (ref 8–23)
CO2: 28 mmol/L (ref 22–32)
Calcium: 9.5 mg/dL (ref 8.9–10.3)
Chloride: 104 mmol/L (ref 98–111)
Creatinine, Ser: 1.25 mg/dL — ABNORMAL HIGH (ref 0.44–1.00)
GFR, Estimated: 41 mL/min — ABNORMAL LOW (ref >60)
Glucose, Bld: 133 mg/dL — ABNORMAL HIGH (ref 70–99)
Potassium: 5.4 mmol/L — ABNORMAL HIGH (ref 3.5–5.1)
Sodium: 138 mmol/L (ref 135–145)

## 2022-01-29 LAB — GLUCOSE, CAPILLARY
Glucose-Capillary: 134 mg/dL — ABNORMAL HIGH (ref 70–99)
Glucose-Capillary: 144 mg/dL — ABNORMAL HIGH (ref 70–99)
Glucose-Capillary: 88 mg/dL (ref 70–99)
Glucose-Capillary: 128 mg/dL — ABNORMAL HIGH (ref 70–99)
Glucose-Capillary: 109 mg/dL — ABNORMAL HIGH (ref 70–99)
Glucose-Capillary: 153 mg/dL — ABNORMAL HIGH (ref 70–99)

## 2022-01-29 LAB — PHOSPHORUS: Phosphorus: 4.2 mg/dL (ref 2.5–4.6)

## 2022-01-29 LAB — POTASSIUM: Potassium: 5.1 mmol/L (ref 3.5–5.1)

## 2022-01-29 LAB — MAGNESIUM: Magnesium: 2.1 mg/dL (ref 1.7–2.4)

## 2022-01-29 MED ORDER — SODIUM CHLORIDE 0.9 % IV BOLUS
500.0000 mL | Freq: Once | INTRAVENOUS | Status: AC
  Administered 2022-01-29: 500 mL via INTRAVENOUS

## 2022-01-29 MED ORDER — MORPHINE SULFATE (PF) 2 MG/ML IV SOLN
1.0000 mg | Freq: Once | INTRAVENOUS | Status: AC
  Administered 2022-01-29: 1 mg via INTRAVENOUS
  Filled 2022-01-29: qty 1

## 2022-01-29 MED ORDER — MORPHINE SULFATE (PF) 2 MG/ML IV SOLN
1.0000 mg | INTRAVENOUS | Status: DC | PRN
  Administered 2022-01-29 – 2022-01-30 (×5): 1 mg via INTRAVENOUS
  Filled 2022-01-29 (×5): qty 1

## 2022-01-29 MED ORDER — TRAVASOL 10 % IV SOLN
INTRAVENOUS | Status: DC
  Filled 2022-01-29: qty 907.2

## 2022-01-29 MED ORDER — IPRATROPIUM-ALBUTEROL 0.5-2.5 (3) MG/3ML IN SOLN
3.0000 mL | Freq: Two times a day (BID) | RESPIRATORY_TRACT | Status: DC
  Administered 2022-01-29 – 2022-01-30 (×3): 3 mL via RESPIRATORY_TRACT
  Filled 2022-01-29 (×3): qty 3

## 2022-01-29 NOTE — Consult Note (Signed)
PHARMACY - TOTAL PARENTERAL NUTRITION CONSULT NOTE   Indication:  Prolonged NPO and inability to place enteral access for nutrition  Patient Measurements: Height: 5' 7.01" (170.2 cm) Weight: 68 kg (150 lb) IBW/kg (Calculated) : 61.62   Body mass index is 23.49 kg/m.  Assessment:  Patient is a 88 y/o F with medical history including Afib on apixaban, breast cancer in 2004, HFpEF, COPD, gout, hepatitis C, HLD, HTN, NSTEMI, lung cancer who is admitted 5/27 with acute respiratory failure in setting of aspiration / acute encephalopathy. Patient was intubated 5/28 thru 6/1. SLP consulted and is recommending NPO except medications. Attempts to place NG tube at bedside have been unsuccessful. Further options for enteral access are being discussed. In the meantime, pharmacy has been consulted to initiate TPN to provide nutrition.  Glucose / Insulin: Last Hgb A1c was 6.1% on 11/05/21. Patient has been normoglycemic. 3 unit(s) of SSI required in the last 24 hours Electrolytes: No abnormalities at present. Potassium and phos trending up to ULN Renal: SCr trending up 0.75>>1.25 Hepatic: LFTs within normal limits Intake / Output; MIVF: I&O -2.3L for the admission; UOP 0.9>1.7>1.6 mL/k/h GI Imaging: N/A GI Surgeries / Procedures: N/A  Central access: PICC placed 5/28 TPN start date: 6/3  Nutritional Goals: Goal TPN rate is 70 mL/hr (provides 90 g of protein and 1830 kcals per day)  RD Assessment: Estimated Needs Total Energy Estimated Needs: 1750-1950 Total Protein Estimated Needs: 85-100 grams Total Fluid Estimated Needs: > 1.7 L  Current Nutrition:  NPO  Plan:  --Continue TPN at 70 mL/hr at 1800 (Full goal rate) --Electrolytes in TPN: Na 22mEq/L, K 30mEq/L (decreased from 50 mEq/L), Ca 15mEq/L, Mg 58mEq/L, and Phos 40mmol/L. Cl:Ac 1:1 --Add standard MVI and trace elements to TPN --Continue Sensitive q8h SSI and adjust as needed; can consider discontinuation if patient tolerates full rate  TPN x 48 hours without insulin requirement  --Monitor TPN labs on Mon/Thurs, daily until stable  Darnelle Bos, PharmD 01/29/2022 11:12 AM

## 2022-01-29 NOTE — Progress Notes (Signed)
Manufacturing engineer [facility]) Hospital Liaison Note  Received request from PMT/Crystal G., NP for family interest in [facility]. Visited patient at bedside and spoke with granddaughter/Erika to confirm interest and explain services.  Approval for Hospice Home is determined by [facility] MD. Once [facility] MD has determined Hospice Home eligibility, [place] will update hospital staff and family. Eligibility is pending.  Please do not hesitate to call with any hospice related questions. TOC aware of above updates.   Thank you for the opportunity to participate in this patient's care.  Daphene Calamity, MSW [facility] Liaison  [phone]

## 2022-01-29 NOTE — Progress Notes (Signed)
OT Cancellation Note  Patient Details Name: Kristin [name] MRN: [id number] DOB: June 07, 1930   Cancelled Treatment:    Reason Eval/Treat Not Completed: Medical issues which prohibited therapy. Per chart review, pt remains hypotensive. OT to re-attempt when pt is able to actively participate. Family also discussing POC goals with pallative.   Darleen Crocker, MS, OTR/L , CBIS ascom [phone]  01/29/22, 1:27 PM

## 2022-01-29 NOTE — TOC Progression Note (Signed)
Transition of Care [facility]) - Progression Note    Patient Details  Name: Kristin [name] MRN: [id number] Date of Birth: 24-Feb-1930  Transition of Care [facility]) CM/SW Contact  Eileen Stanford, LCSW Phone Number: 01/29/2022, 3:47 PM  Clinical Narrative:   Per Palliative family wants to pursue hospice home. Referral provided to [facility] with [facility]. She will reach out to family.    Expected Discharge Plan:  (TBD) Barriers to Discharge: Continued Medical Work up  Expected Discharge Plan and Services Expected Discharge Plan:  (TBD)   Discharge Planning Services: CM Consult   Living arrangements for the past 2 months: Single Family Home                 DME Arranged: N/A                     Social Determinants of Health (SDOH) Interventions    Readmission Risk Interventions    01/22/2022   12:27 PM 11/06/2021    4:09 PM 09/21/2021   11:49 AM  Readmission Risk Prevention Plan  Transportation Screening Complete Complete Complete  PCP or Specialist Appt within 3-5 Days   Complete  HRI or [place]   Complete  Social Work Consult for [place] Planning/Counseling   Complete  Palliative Care Screening   Not Applicable  Medication Review Press photographer) Complete Complete Complete  PCP or Specialist appointment within 3-5 days of discharge Complete Complete   HRI or [place]  Complete   SW Recovery Care/Counseling Consult Not Complete Not Complete   SW Consult Not Complete Comments NA- palliative to meet with patient and family RNCM assigned to case   Palliative Care Screening Complete Not [place]  Not Applicable

## 2022-01-29 NOTE — Progress Notes (Signed)
[facility]) Hospital Liaison note:  This patient is currently enrolled in [facility] outpatient-based Palliative Care. Will continue to follow for disposition.  Please call with any outpatient palliative questions or concerns.  Thank you, Lorelee Market, LPN [facility] Liaison [phone]

## 2022-01-29 NOTE — Progress Notes (Signed)
PT Cancellation Note  Patient Details Name: Kristin [name] MRN: [id number] DOB: [birth year]   Cancelled Treatment:     PT hold. Pt has low BP and is not appropriate to participate in therapy at this time. Will continue to follow closely but awaiting family decision on POC going forward.    Willette Pa 01/29/2022, 11:51 AM

## 2022-01-29 NOTE — Progress Notes (Addendum)
Daily Progress Note   Patient Name: Kristin [name]       Date: 01/29/2022 DOB: 10-08-1929  Age: 88 y.o. MRN#: [id number] Attending Physician: Shawna Clamp, MD Primary Care Physician: [name], MD Admit Date: 01/16/2022  Reason for Consultation/Follow-up: Establishing goals of care  Subjective: Notes and labs reviewed independently. In to see patient. She is resting in bed, no family at bedside. She is attempting to clear her throat and cough. She appears very weak and frail. Upon discussing her status she states "I don't know, I'm not sue about that" when asked her thoughts on a feeding tube.   Called son. Son states the family is working to make the decisions but he states she has always said do what you can to keep me alive. He states they are meeting with the pastor today to discuss. Discussed God's power and his ultimate decision making. He states his niece is patient's HPOA.  Called to speak with [name]. She agrees she is HPOA amongst other things. She states her mother died. She tells me her uncle lives with patient, and patient has always worried about being sure he would be taken care of when she is gone. She discusses family dynamics.    We discussed her diagnoses, prognosis, GOC, EOL wishes disposition and options.  Created space and opportunity for patient  to explore thoughts and feelings regarding current medical information.   A detailed discussion was had today regarding advanced directives.  Concepts specific to code status, artifical feeding and hydration, IV antibiotics and rehospitalization were discussed.  The difference between an aggressive medical intervention path and a comfort care path was discussed.  Values and goals of care important to patient and family were  attempted to be elicited.  Discussed limitations of medical interventions to prolong quality of life in some situations and discussed the concept of human mortality. Family is a family of faith.   She states she does not want her grandmother to suffer. She states she is leaning towards hospice facility, but states she wants her family to agree. She states they discussed making a deadline for a definitive decision Monday, if they do not agree on a plan before then.   We discussed patient's  previous thoughts of wanting to do everything possible to live... as long as she could get  well and go home. Discussed that she wants to be very independent. I discussed determining wishes if she is not able to return home, or is no longer independent. Discussed determining boundaries to care. She tells me she will speak with family and make decisions. She states her grandmother detested discussing death or end of life, and so she is unsure of limits. Discussed determining code status. Discussed her frailty and concern for a poor and limited prognosis even with full scope treatment.   Discussed comfort care and hospice in depth. If [name] chooses, patient is currently a candidate for hospice facility. [name] is aware depending on the time line of decisions and bed availability, patient may become too unstable to leave the hospital and could die here. If she chooses PEG, recommend outpatient palliative, and transition to hospice when they are ready.     Length of Stay: 12  Current Medications: Scheduled Meds:   apixaban  2.5 mg Oral BID   aspirin  81 mg Oral Daily   chlorhexidine  15 mL Mouth Rinse BID   Chlorhexidine Gluconate Cloth  6 each Topical Daily   furosemide  40 mg Oral Daily   insulin aspart  0-9 Units Subcutaneous Q8H   ipratropium-albuterol  3 mL Nebulization TID   lidocaine  1 patch Transdermal Q24H   mouth rinse  15 mL Mouth Rinse q12n4p   melatonin  10 mg Oral QHS   mometasone-formoterol  2  puff Inhalation BID   And   umeclidinium bromide  1 puff Inhalation Daily   pantoprazole  40 mg Oral QHS   rOPINIRole  4 mg Oral QHS   sodium chloride flush  10-40 mL Intracatheter Q12H    Continuous Infusions:  sodium chloride Stopped (01/17/22 1742)   sodium chloride     cefTRIAXone (ROCEPHIN)  IV Stopped (01/28/22 1631)   TPN ADULT (ION) 70 mL/hr at 01/29/22 0219   TPN ADULT (ION)      PRN Meds: sodium chloride, acetaminophen **OR** acetaminophen, albuterol, oxyCODONE, sodium chloride flush  Physical Exam Pulmonary:     Comments: Cough and throat clearing attempts.  Skin:    General: Skin is warm and dry.  Neurological:     Mental Status: She is alert.             Vital Signs: BP (!) 96/51 (BP Location: Left Arm)   Pulse 66   Temp 97.6 F (36.4 C)   Resp 18   Ht 5' 7.01" (1.702 m)   Wt 68 kg   SpO2 91%   BMI 23.49 kg/m  SpO2: SpO2: 91 % O2 Device: O2 Device: Nasal Cannula O2 Flow Rate: O2 Flow Rate (L/min): 5 L/min  Intake/output summary:  Intake/Output Summary (Last 24 hours) at 01/29/2022 1237 Last data filed at 01/29/2022 0604 Gross per 24 hour  Intake 2387.15 ml  Output 2650 ml  Net -262.85 ml   LBM: Last BM Date : 01/26/22 Baseline Weight: Weight: 75.8 kg Most recent weight: Weight: 68 kg    Patient Active Problem List   Diagnosis Date Noted   Pressure injury of skin 01/23/2022   Malnutrition of moderate degree 01/18/2022   Acute respiratory failure (HCC) 09/32/6712   Acute metabolic encephalopathy/unresponsive    Sepsis ([place]) 12/18/2021   Palliative care encounter    Hypertension 12/17/2021   AMS (altered mental status) 12/17/2021   Malignant neoplasm of right upper lobe of lung ([place]) 12/02/2021   Small cell lung cancer ([place]) 11/17/2021   Goals of care,  counseling/discussion 11/17/2021   Overweight (BMI 25.0-29.9) 11/11/2021   Bacteremia 11/06/2021   Multifocal pneumonia 11/05/2021   Mass of upper lobe of right lung 11/05/2021   Sepsis  due to Escherichia coli (E. coli) ([place]) 11/05/2021   Dyslipidemia 11/05/2021   COPD (chronic obstructive pulmonary disease) ([place]) 09/20/2021   Lymphedema 09/17/2021   Ischemic leg pain (Bilateral) 08/19/2021   Paroxysmal atrial fibrillation (HCC)    History of atrial fibrillation 04/08/2021   PVD (peripheral vascular disease) ([place]) 04/08/2021   Low blood pressure reading 04/07/2021   Physical tolerance to opiate drug 01/28/2021   Chronic use of opiate for therapeutic purpose 11/24/2020   Atherosclerosis of native arteries of the extremities with ulceration ([place]) 08/67/6195   Uncomplicated opioid dependence ([place]) 10/20/2020   Encounter for chronic pain management 10/02/2020   Encounter for pain management counseling 10/02/2020   Chronic kidney disease 10/02/2020   Chronic sacroiliac pain (Bilateral) 09/30/2020   Osteoarthritis of sacroiliac joints (Bilateral) ([place]) 09/30/2020   Chronic midline low back pain without sciatica 09/30/2020   Acute on chronic respiratory failure with hypoxia (HCC) 08/02/2020   Prolonged QT interval 08/02/2020   Venous insufficiency of both lower extremities 07/04/2020   Varicose veins of left lower extremity with inflammation, with ulcer of ankle limited to breakdown of skin ([place]) 04/01/2020   Opioid dependence with opioid-induced disorder ([place]) 02/14/2020   Abnormal MRI, lumbar spine (11/03/2019) 01/23/2020   Pharmacologic therapy 12/20/2019   Other specified dorsopathies, sacral and sacrococcygeal region 06/12/2019   Chronic hip pain (Left) 06/12/2019   Osteoarthritis of hip (Left) 06/12/2019   Greater trochanteric bursitis of hip (Left) 03/12/2019   Preop testing 01/22/2019   Cervicalgia 10/03/2018   Chronic shoulders pain (Bilateral) (L>R) 05/29/2018   Osteoarthritis of shoulder (Bilateral) 05/29/2018   Osteoarthritis of hip (Bilateral) 05/29/2018   Chronic shoulder pain after shoulder replacement (Bilateral) (L>R) 05/29/2018   Trigger finger, left  middle finger 05/29/2018   Spondylosis without myelopathy or radiculopathy, lumbosacral region 02/09/2018   Insomnia 02/01/2018   Numbness and tingling of upper extremity (Bilateral) 09/07/2017   Chronic shoulder pain, Radicular (Bilateral) (L>R) 09/07/2017   Cervical facet syndrome (Bilateral) (L>R) 09/07/2017   Cervical (3-4 mm) Anterolisthesis of C4 on C5 09/07/2017   Cervical foraminal stenosis (Bilateral) 09/07/2017   Acute renal failure superimposed on stage 3a chronic kidney disease ([place]) 08/14/2017   Chronic lower extremity pain (2ry area of Pain) (Bilateral) (L>R) 05/13/2017   DDD (degenerative disc disease), cervical 05/13/2017   Chronic cervical radicular pain (3ry area of Pain) (Bilateral) (L>R) 05/04/2017   Chronic anticoagulation ([place]) 05/04/2017   Chronic hip pain (Bilateral) 04/20/2017   Chronic neck pain 03/01/2017   Hyperlipidemia 02/02/2017   Red blood cell antibody positive 11/18/2016   Anticoagulated 11/17/2016   Atrial fibrillation and flutter ([place]) 11/17/2016   Osteoarthritis involving multiple joints 06/14/2016   Cardiac pacemaker in situ 01/07/2016   S/P cardiac pacemaker procedure 01/07/2016   GERD (gastroesophageal reflux disease) 12/18/2015   Chronic pain syndrome 06/25/2015   Chronic low back pain (1ry area of Pain) (Bilateral) (L>R) w/ sciatica (Bilateral) 06/25/2015   Lumbar facet syndrome (Bilateral) (L>R) 06/25/2015   Lumbar spondylosis 06/25/2015   Chronic lumbar radicular pain (Bilateral) (L>R) 06/25/2015   Restless leg 06/25/2015   Myofascial pain 06/25/2015   Neurogenic pain 06/25/2015   Long term current use of opiate analgesic 06/25/2015   Long term prescription opiate use 06/25/2015   Opiate use (90 MME/Day) 06/25/2015   Opioid-induced constipation (OIC) 06/25/2015  Vitamin D insufficiency 06/25/2015   Grade 1 Retrolisthesis of L1 over L2 06/25/2015   Lumbar facet hypertrophy (L1-2, L2-3, and L4-5) 06/25/2015   Lumbar lateral recess  stenosis (L1-2 and L4-5) 06/25/2015   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) 06/25/2015   Epidural fibrosis 06/25/2015   Lumbar foraminal stenosis (Severe Left L4-5) 06/25/2015   Osteoporosis 06/25/2015   Chronic sacroiliac joint pain (Left) 06/25/2015   History of methicillin resistant staphylococcus aureus (MRSA) 06/25/2015   Mitral valve prolapse 06/25/2015   Seasonal allergies 06/17/2015   History of breast cancer 06/17/2015   Chronic diastolic CHF (congestive heart failure) ([place]) 12/26/2014   Centrilobular emphysema ([place]) 09/16/2014   S/P cardiac catheterization 04/04/2014   DDD (degenerative disc disease), lumbosacral 04/03/2014   Benign hypertension with CKD (chronic kidney disease) stage III ([place]) 04/03/2014   Lumbar central spinal stenosis 04/03/2014   Chronic kidney disease (CKD), stage III (moderate) ([place]) 10/30/2013   Gout 10/30/2013    Palliative Care Assessment & Plan   Recommendations/Plan: If [name] chooses, patient is currently a candidate for hospice facility. [name] is aware depending on the time line of decisions and bed availability, patient may become too unstable to leave the hospital and could die here. If she chooses PEG, recommend outpatient palliative, and transition to hospice when they are ready.    Code Status:    Code Status Orders  (From admission, onward)           Start     Ordered   01/17/22 0120  Full code  Continuous        01/17/22 0121           Code Status History     Date Active Date Inactive Code Status Order ID Comments User Context   12/17/2021 2313 12/19/2021 2011 Full Code [id number]  Para Skeans, MD ED   11/05/2021 1816 11/12/2021 2150 Full Code [id number]  Mansy, Arvella Merles, MD ED   09/19/2021 0427 09/22/2021 1854 Full Code [id number]  Mansy, Arvella Merles, MD ED   09/08/2021 1407 09/10/2021 0512 Full Code [id number]  Schnier, Dolores Lory, MD Inpatient   04/08/2021 0026 04/09/2021 1601 Full Code [id number]  Orene Desanctis, DO ED   10/14/2020 1340 10/14/2020 2007 Full Code [id number]  Delana Meyer, Dolores Lory, MD Inpatient   08/03/2020 0046 08/05/2020 1638 Full Code [id number]  Zierle-Ghosh, Asia B, DO Inpatient   05/24/2020 0350 05/25/2020 1944 Full Code [id number]  Rolla Plate, DO ED   08/14/2017 1649 08/15/2017 1932 Full Code [id number]  Nicholes Mango, MD Inpatient   12/24/2015 1522 12/25/2015 1349 Full Code [id number]  Isaias Cowman, MD Inpatient   10/29/2015 0235 10/30/2015 1923 Full Code [id number]  Saundra Shelling, MD ED      Advance Directive Documentation    Flowsheet Row Most Recent Value  Type of Advance Directive Healthcare Power of Attorney  Pre-existing out of facility DNR order (yellow form or pink MOST form) --  "MOST" Form in Place? --       Prognosis:  < 2 weeks   Thank you for allowing the Palliative Medicine Team to assist in the care of this patient.   Asencion Gowda, NP  Please contact Palliative Medicine Team phone at [phone] for questions and concerns.

## 2022-01-29 NOTE — Progress Notes (Addendum)
PROGRESS NOTE    Kristin [name]  GBT:[id number] DOB: [birth year] DOA: 01/16/2022  PCP: [name], MD   Brief Narrative:  This 88 years old female with PMH significant for COPD, chronic diastolic CHF, lung cancer, CKD stage IIIa presented in the ED with c/o: lethargy and weakness.  Patient was sleeping more than usual, In the ED patient was found to have multifocal patchy opacities on chest x-ray,  there was suspected tumor in the right upper lobe with superimposed multifocal pneumonia.  Patient decompensated and required mechanical ventilation for airway protection.  Patient displayed seizure-like activity while on the ventilator.  She was loaded with Keppra and neurology was consulted.  EEG showed diffuse encephalopathy of indeterminate etiology.  Oncology was also consulted as patient carries a known diagnosis of small cell lung cancer.  As per Dr. Rogue Bussing patient is not a candidate for chemotherapy.  Patient has overall poor prognosis.  CT head and neck was negative for any brain mets.  Patient also had atrial fibrillation with RVR, started on heparin drip.  She was extubated on 01/21/2022.  Patient overall was less communicative,  sedating medications were discontinued.  Palliative care was consulted, family wished to maintain full scope of care.  Family is also agreeable to the PEG tube.  Speech and swallow felt that patient is at high risk for aspiration recommendation was NPO.  Patient is currently getting TPN for this reason.  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 to suspected aspiration in the setting of possible CAP and acute HFpEF exacerbation. Continue supplemental oxygen, saturating 91% on 5 L.  Acute on chronic HFpEF exacerbation, POA: Patient has mildly elevated troponin sec. to suspected demand ischemia. Continue cardiac monitoring. Daily weight, intake output charting. Continue IV Lasix for diuresis.  Paroxysmal atrial fibrillation: Continue cardiac monitoring.   Heart rate is controlled, continue Eliquis.  Acute kidney injury on CKD stage IIIb: Baseline serum creatinine 1.18, creatinine on admission 1.50. Avoid nephrotoxic medications and hypotension Serum creatinine improved and back to baseline.  Acute encephalopathy unknown etiology> resolved. CT head without contrast : No brain mets noted. Consider EEG /MRI brain if mentation does not improve with oxygenation support. Avoid sedating medications as able.  Dysphagia: Patient was initially evaluated by speech on 01/23/22. She was felt to be at high risk for aspiration.  Recommendation was for NPO. Patient is currently receiving TPN for this reason.   Patient was reevaluated by speech/ underwent MBS, recommended NPO, consider alternate route of feeding.  Gabriel Carina UTI: Patient has completed antibiotics treatment. UA+ still.  Non-small cell lung CA: Appreciate Dr. Aletha Halim assistance. Patient has completed course of palliative radiation. CT head without contrast shows no any signs of brain mets.  Goals of care discussion: Patient  has poor prognosis.  It  was explained to the granddaughter Kristin [name]. Palliative care was notified.  Family wants to  discuss in detail about goals of care Family meeting completed yesterday about goals of care discussion. Family is going to make a determination about hospice.   DVT prophylaxis:Eliquis Code Status: Full code. Family Communication: Spoke with granddaughter Kristin [name] on phone. Disposition Plan:   Status is: Inpatient Remains inpatient appropriate because:   Admitted for acute encephalopathy which has been resolved. Completed antibiotics for pneumonia and UTI.  Patient is now getting TPN for dysphagia and poor nutrition.   Palliative care consulted to discuss goals of care. Family is going to make a determination about hospice.   Consultants:  PCCM Oncology Neurology  speech  Procedures: MRI, Mechanical ventilation and extubation, PICC line, TPN  Antimicrobials:  Anti-infectives (From admission, onward)    Start     Dose/Rate Route Frequency Ordered Stop   01/28/22 1415  cefTRIAXone (ROCEPHIN) 1 g in sodium chloride 0.9 % 100 mL IVPB        1 g 200 mL/hr over 30 Minutes Intravenous Every 24 hours 01/28/22 1327     01/20/22 1800  levofloxacin (LEVAQUIN) IVPB 750 mg        750 mg 100 mL/hr over 90 Minutes Intravenous Every 48 hours 01/20/22 1447 01/22/22 1909   01/19/22 1800  Ampicillin-Sulbactam (UNASYN) 3 g in sodium chloride 0.9 % 100 mL IVPB  Status:  Discontinued        3 g 200 mL/hr over 30 Minutes Intravenous Every 6 hours 01/19/22 1427 01/20/22 1443   01/19/22 0600  vancomycin (VANCOREADY) IVPB 1250 mg/250 mL  Status:  Discontinued        1,250 mg 83.3 mL/hr over 180 Minutes Intravenous Every 48 hours 01/17/22 0508 01/17/22 1045   01/19/22 0600  vancomycin (VANCOREADY) IVPB 1500 mg/300 mL  Status:  Discontinued        1,500 mg 75 mL/hr over 240 Minutes Intravenous Every 48 hours 01/17/22 1045 01/18/22 1339   01/18/22 1500  Ampicillin-Sulbactam (UNASYN) 3 g in sodium chloride 0.9 % 100 mL IVPB  Status:  Discontinued        3 g 200 mL/hr over 30 Minutes Intravenous Every  12 hours 01/18/22 1339 01/19/22 1427   01/18/22 0100  cefTRIAXone (ROCEPHIN) 2 g in sodium chloride 0.9 % 100 mL IVPB  Status:  Discontinued        2 g 200 mL/hr over 30 Minutes Intravenous Every 24 hours 01/17/22 0121 01/17/22 0445   01/17/22 0545  piperacillin-tazobactam (ZOSYN) IVPB 3.375 g  Status:  Discontinued        3.375 g 12.5 mL/hr over 240 Minutes Intravenous Every 8 hours 01/17/22 0455 01/18/22 1339   01/17/22 0545  vancomycin (VANCOREADY) IVPB 1750 mg/350 mL        1,750 mg 87.5 mL/hr over 240 Minutes Intravenous  Once 01/17/22 0457 01/17/22 0929   01/17/22 0145  cefTRIAXone (ROCEPHIN) 1 g in sodium chloride 0.9 % 100 mL IVPB  Status:  Discontinued       Note to Pharmacy: Give following initial 1 gm dose to make total dose of 2 gm .   1 g 200 mL/hr over 30 Minutes Intravenous  Once 01/17/22 0136 01/17/22 0445   01/17/22 0130  azithromycin (ZITHROMAX) 500 mg in sodium chloride 0.9 % 250 mL IVPB  Status:  Discontinued        500 mg 250 mL/hr over 60 Minutes Intravenous Every 24 hours 01/17/22 0121 01/17/22 0445  01/17/22 0045  cefTRIAXone (ROCEPHIN) 1 g in sodium chloride 0.9 % 100 mL IVPB        1 g 200 mL/hr over 30 Minutes Intravenous  Once 01/17/22 0034 01/17/22 0155   01/17/22 0045  azithromycin (ZITHROMAX) 500 mg in sodium chloride 0.9 % 250 mL IVPB        500 mg 250 mL/hr over 60 Minutes Intravenous  Once 01/17/22 0034 01/17/22 0302        Subjective: Patient was seen and examined at bedside.  Overnight events noted. Patient appears very weak, frail, chronically ill looking, deconditioned. She is alert, following commands , still  producing a lot of phlegm,  denies any chest pain. Patient had a fever 100.4 last night, resolved with Tylenol.   Objective: Vitals:   01/29/22 0925 01/29/22 1115 01/29/22 1408 01/29/22 1409  BP: (!) 83/50 (!) 96/51 117/62 125/69  Pulse: 66 66    Resp:  18    Temp:      TempSrc:      SpO2:  91%    Weight:      Height:         Intake/Output Summary (Last 24 hours) at 01/29/2022 1413 Last data filed at 01/29/2022 0604 Gross per 24 hour  Intake 2387.15 ml  Output 2650 ml  Net -262.85 ml   Filed Weights   01/25/22 0500 01/27/22 0509 01/29/22 0500  Weight: 77.3 kg 77 kg 68 kg    Examination:  General exam: Appears chronically ill looking, frail elderly lady,  deconditioned,  not in any distress. Respiratory system: CTA bilaterally, no accessory muscle use, respiratory effort normal. Cardiovascular system: S1 & S2 heard, regular rate and rhythm, no murmur. Gastrointestinal system: Abdomen is soft, non tender, non distended, BS + Central nervous system: Alert and oriented x 2, no focal neurological deficits. Extremities: No edema, no cyanosis, no clubbing. Skin: No rashes, lesions or ulcers Psychiatry:  Mood & affect appropriate.     Data Reviewed: I have personally reviewed following labs and imaging studies  CBC: Recent Labs  Lab 01/24/22 0443 01/25/22 0739 01/26/22 0608 01/27/22 0452 01/28/22 0500  WBC 5.4 5.5 6.2 6.8 7.6  NEUTROABS 3.8 3.5 4.1 4.6  --   HGB 9.1* 9.4* 9.3* 9.5* 9.5*  HCT 29.1* 30.3* 30.0* 30.2* 30.4*  MCV 85.8 86.1 85.7 86.0 85.4  PLT 146* 154 162 182 809   Basic Metabolic Panel: Recent Labs  Lab 01/23/22 0336 01/24/22 0443 01/25/22 0739 01/27/22 0452 01/28/22 0500 01/29/22 0509 01/29/22 0930  NA 135 136 139 139 137 138  --   K 3.8 3.5 4.0 4.6 4.7 5.4* 5.1  CL 109 109 112* 111 105 104  --   CO2 20* 23 23 25 28 28   --   GLUCOSE 86 107* 111* 132* 121* 133*  --   BUN 18 19 23  32* 39* 50*  --   CREATININE 0.88 0.75 0.64 0.74 0.75 1.25*  --   CALCIUM 9.5 9.4 9.6 9.5 9.7 9.5  --   MG 2.0 2.0 2.0  --  2.1 2.1  --   PHOS 2.5 2.8 3.0  --  4.3 4.2  --    GFR: Estimated Creatinine Clearance: 29.1 mL/min (A) (by C-G formula based on SCr of 1.25 mg/dL (H)). Liver Function Tests: Recent Labs  Lab 01/24/22 0443 01/25/22 0739 01/28/22 0500  AST 19 16 19   ALT 14 12 9    ALKPHOS 62 59 58  BILITOT 0.7 0.2* 0.5  PROT 5.8*  5.8* 6.5  ALBUMIN 2.3* 2.2* 2.4*   No results for input(s): "LIPASE", "AMYLASE" in the last 168 hours. No results for input(s): "AMMONIA" in the last 168 hours. Coagulation Profile: No results for input(s): "INR", "PROTIME" in the last 168 hours. Cardiac Enzymes: No results for input(s): "CKTOTAL", "CKMB", "CKMBINDEX", "TROPONINI" in the last 168 hours. BNP (last 3 results) No results for input(s): "PROBNP" in the last 8760 hours. HbA1C: No results for input(s): "HGBA1C" in the last 72 hours. CBG: Recent Labs  Lab 01/28/22 2324 01/29/22 0339 01/29/22 0541 01/29/22 0825 01/29/22 1120  GLUCAP 74 134* 144* 88 128*   Lipid Profile: Recent Labs    01/28/22 0500  TRIG 35   Thyroid Function Tests: No results for input(s): "TSH", "T4TOTAL", "FREET4", "T3FREE", "THYROIDAB" in the last 72 hours. Anemia Panel: No results for input(s): "VITAMINB12", "FOLATE", "FERRITIN", "TIBC", "IRON", "RETICCTPCT" in the last 72 hours. Sepsis Labs: No results for input(s): "PROCALCITON", "LATICACIDVEN" in the last 168 hours.  No results found for this or any previous visit (from the past 240 hour(s)).  Radiology Studies: DG Swallowing Func-Speech Pathology  Result Date: 01/28/2022 Calla Kicks     01/28/2022  1:48 PM Objective Swallowing Evaluation: Type of Study: Bedside Swallow Evaluation  Patient Details Name: Kristin [name] MRN: [id number] Date of Birth: 17-Dec-1929 Today's Date: 01/28/2022 Time: SLP Start Time (ACUTE ONLY): 7371 -SLP Stop Time (ACUTE ONLY): 1055 SLP Time Calculation (min) (ACUTE ONLY): 40 min Past Medical History: Past Medical History: Diagnosis Date  Acute postoperative pain 02/09/2018  Arrhythmia, sinus node 04/03/2014  Arthritis   Arthritis   Atrial fibrillation (HCC)   Back pain   lower back chronic  Bradycardia   Breast cancer ([place]) 2004  right breast cancer  Cancer ([place]) 2004  rt breast cancer-post lumpectomy-  chemo/rad  CHF (congestive heart failure) (HCC)   Chronic back pain   CKD (chronic kidney disease)   COPD (chronic obstructive pulmonary disease) ([place])   wears O2 at 2L via [place] at night  COVID-19 06/11/2020  Cystocele   Decubitus ulcers   Dehydration   Gout   Hematuria   Hepatitis C   Hyperlipidemia   Hypertension   Hypomagnesemia 06/25/2015  NSTEMI (non-ST elevated myocardial infarction) ([place]) 04/08/2021  OAB (overactive bladder)   Overactive bladder   Overdose opiate, accidental or unintentional, sequela 10/02/2020  Restless leg   Shoulder pain, bilateral   Urinary frequency   UTI (lower urinary tract infection)   Vaginal atrophy  Past Surgical History: Past Surgical History: Procedure Laterality Date  ABDOMINAL HYSTERECTOMY    BACK SURGERY    BREAST LUMPECTOMY Right 2004  BREAST SURGERY    rt lumpectomy  CARPAL TUNNEL RELEASE    CATARACT EXTRACTION W/PHACO  10/19/2011  Procedure: CATARACT EXTRACTION PHACO AND INTRAOCULAR LENS PLACEMENT ([place]);  Surgeon: Elta Guadeloupe T. Gershon Crane, MD;  Location: [facility];  Service: Ophthalmology;  Laterality: Right;  CDE:10.81  CATARACT EXTRACTION W/PHACO  11/02/2011  Procedure: CATARACT EXTRACTION PHACO AND INTRAOCULAR LENS PLACEMENT ([facility]);  Surgeon: Elta Guadeloupe T. Gershon Crane, MD;  Location: [facility];  Service: Ophthalmology;  Laterality: Left;  CDE 8.60  CHOLECYSTECTOMY    3/18  COLON SURGERY    INSERT / REPLACE / REMOVE PACEMAKER    JOINT REPLACEMENT    bilateral TKA  LOWER EXTREMITY ANGIOGRAPHY Left 10/14/2020  Procedure: LOWER EXTREMITY ANGIOGRAPHY;  Surgeon: Katha Cabal, MD;  Location: [facility];  Service: Cardiovascular;  Laterality: Left;  LOWER EXTREMITY ANGIOGRAPHY Right 09/08/2021  Procedure:  LOWER EXTREMITY ANGIOGRAPHY;  Surgeon: Katha Cabal, MD;  Location: [facility];  Service: Cardiovascular;  Laterality: Right;  PACEMAKER INSERTION N/A 12/24/2015  Procedure: INSERTION PACEMAKER;  Surgeon: Isaias Cowman, MD;  Location: [facility];  Service: Cardiovascular;   Laterality: N/A;  ROTATOR CUFF REPAIR    bilateral  VIDEO BRONCHOSCOPY WITH ENDOBRONCHIAL NAVIGATION Right 11/11/2021  Procedure: VIDEO BRONCHOSCOPY WITH ENDOBRONCHIAL NAVIGATION;  Surgeon: Ottie Glazier, MD;  Location: [facility];  Service: Thoracic;  Laterality: Right; HPI: Per admitting H&P "Kristin [name] is a 88 y.o. 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  Son at bedside and contributes to history.  Patient unable to arouse to answer questions  ED course and data review: Vitals within normal limits on arrival.  ABG on 4 L shows pH 7.27 with PCO2 of 50 and PO2 of 60.  CBC unremarkable and lactic acid 1.5.  CMP significant for creatinine of 1.5 above baseline of 1.18.  Troponin 62.  COVID-negative.  EKG, personally viewed and interpreted shows sinus rhythm at 64 with no acute ST-T wave changes.  Chest x-ray shows the following:  IMPRESSION:  Multifocal patchy opacities, progressive when compared to prior.  While some of this may reflect tumor in the right upper lobe,  superimposed multifocal pneumonia is suspected."  Subjective: Pt alert, confusion evident. Limited and very inconsistent ability to follow directions. On 5L/min O2 via [place].  Recommendations for follow up therapy are one component of a multi-disciplinary discharge planning process, led by the attending physician.  Recommendations may be updated based on patient status, additional functional criteria and insurance authorization. Assessment / Plan / Recommendation   01/28/2022   1:31 PM Clinical Impressions Clinical Impression Pt seen for repeat MBSS. Pt alert, confusion evident. Limited and very inconsistent ability to follow  directions. On 5L/min O2 via [place]. Pt presents with a severe oropharyngeal dysphagia. Pt given trials of thin liquids (via teaspoon and cup sip), nectar-thick liquids (via tsp and cup sip), and pureed. Solid not assessed due to severity of oropharyngeal dysphagia. Oral deficits c/b oral holding and delayed oral transit across all textures, piecemeal swallow with cup sip of nectar-thick liquids, and pre-swallow loss of pudding with significant pharyngeal pooling. Oral deficits secondary to lingual weakness/incoordination and likely exacerbated by AMS. Pharyngeal deficits c/b swallow initiation at the level of the vallecula with all textures except puree where pharyngeal swallow was initiated at the level of the pyriform sinus after significant pharyngeal pooling, trace-severe vallecular stasis which was most appreciated with pudding, trace-mild pyriform sinus residual across all textures, and mild posterior pharyngeal wall residual with pudding. Additionally, pt with during the swallow laryngeal penetration above the vocal cords and after the swallow penetration to the level of the true vocal cords across all textures; pt with after the swallow aspiration of thin liquids via teaspoon when used as liquid wash. Pt with inconsistent and weak throat clear response to penetration/aspiration. Pharyngeal deficits secondary to reduced base of tongue retraction, reduced hyolaryngeal elevation, reduced pharyngeal constriction, reduced laryngeal vestibule closure, reduced pharyngeal and laryngeal sensation, and weak/ineffective throat clearing response. Attempted compensatory strategies of use of secondry swallow to aid in pharyngeal clearance, liquid wash to aid in  pharyngeal clearance, use of cued throat clear/cough to clear penetration/aspiration,. Pt unable to follow commands for secondary swallows or cued throat clear/cough. Teaspoon of thin liquid wash resulted in after the swallow aspiration of thin liquids which were  presumed to be mixed with pudding residual. A safe oral diet cannot be recommended at this time given severity of pt's oropharyngeal dysphagia and overall pattern of pt's swallowing physiology. Recommend NPO with GOC discussion regarding comfort diet recommendations vs long term alternate means of nutrition/hydration/medication. Regardless, of means of nutrition/hydration/medication, pt remains at high risk for aspiration/aspiration PNA given dental status, mental status, and multiple medical comorbidities. Presence of long term alternate route of nutrition/hydration/medication does not eliminate risk of aspiration/aspiration PNA. RN and MD made aware of results, recommendations, and SLP POC. SLP to sign off as pt has no further SLP needs at this time.     01/28/2022   1:16 PM Treatment Recommendations Treatment Recommendations No treatment recommended at this time     01/28/2022   1:17 PM Prognosis Prognosis for Safe Diet Advancement Guarded Barriers to Reach Goals Severity of deficits;Cognitive deficits;Time post onset;Behavior   01/28/2022   1:16 PM Diet Recommendations SLP Diet Recommendations NPO Medication Administration Via alternative means     01/28/2022   1:31 PM Other Recommendations Recommended Consults --   01/23/2022  10:00 AM Frequency and Duration  Speech Therapy Frequency (ACUTE ONLY) min 5x/week Treatment Duration 1 week     01/28/2022   1:21 PM Oral Phase Oral Phase Impaired Oral - Nectar Teaspoon Holding of bolus;Delayed oral transit Oral - Nectar Cup Holding of bolus;Delayed oral transit;Piecemeal swallowing Oral - Nectar Straw NT Oral - Thin Teaspoon Holding of bolus;Delayed oral transit Oral - Thin Cup Holding of bolus;Delayed oral transit Oral - Thin Straw NT Oral - Puree Reduced posterior propulsion;Delayed oral transit;Premature spillage    01/28/2022   1:24 PM Pharyngeal Phase Pharyngeal Phase Impaired Pharyngeal- Nectar Teaspoon Delayed swallow initiation-vallecula;Reduced tongue base  retraction;Reduced airway/laryngeal closure;Reduced laryngeal elevation;Penetration/Aspiration during swallow;Penetration/Apiration after swallow;Pharyngeal residue - valleculae;Pharyngeal residue - pyriform Pharyngeal Material enters airway, remains ABOVE vocal cords and not ejected out;Material enters airway, CONTACTS cords and not ejected out Pharyngeal- Nectar Cup Delayed swallow initiation-vallecula;Reduced tongue base retraction;Reduced airway/laryngeal closure;Reduced laryngeal elevation;Penetration/Aspiration during swallow;Penetration/Apiration after swallow;Pharyngeal residue - valleculae;Pharyngeal residue - pyriform Pharyngeal Material enters airway, remains ABOVE vocal cords and not ejected out;Material enters airway, CONTACTS cords and not ejected out Pharyngeal- Nectar Straw NT Pharyngeal- Thin Teaspoon Delayed swallow initiation-vallecula;Reduced tongue base retraction;Reduced airway/laryngeal closure;Penetration/Aspiration during swallow;Penetration/Apiration after swallow;Trace aspiration;Moderate aspiration;Pharyngeal residue - valleculae;Pharyngeal residue - pyriform Pharyngeal Material does not enter airway;Material enters airway, remains ABOVE vocal cords and not ejected out;Material enters airway, CONTACTS cords and not ejected out;Material enters airway, passes BELOW cords then ejected out Pharyngeal- Thin Cup Delayed swallow initiation-vallecula;Reduced tongue base retraction;Reduced laryngeal elevation;Reduced airway/laryngeal closure;Penetration/Aspiration during swallow;Penetration/Apiration after swallow;Pharyngeal residue - valleculae;Pharyngeal residue - pyriform Pharyngeal Material enters airway, remains ABOVE vocal cords and not ejected out;Material enters airway, CONTACTS cords and not ejected out Pharyngeal- Thin Straw NT Pharyngeal- Puree Delayed swallow initiation-pyriform sinuses;Reduced tongue base retraction;Reduced airway/laryngeal closure;Reduced laryngeal elevation;Reduced  pharyngeal peristalsis;Penetration/Aspiration during swallow;Penetration/Apiration after swallow;Pharyngeal residue - valleculae;Pharyngeal residue - pyriform;Pharyngeal residue - posterior pharnyx Pharyngeal Material enters airway, remains ABOVE vocal cords and not ejected out;Material enters airway, CONTACTS cords and not ejected out Pharyngeal Comment pt with inconsistent and weak throat clear response to penetration/aspiration; pt unable to follow commands for secondary swallows or cued throat clear/cough; liquid wash resulted in after  the swallow aspiration of thin liquids which were presumed to be mixed with pudding residual    01/28/2022   1:20 PM Cervical Esophageal Phase  Cervical Esophageal Phase [facility] Cherrie Gauze, M.S., [facility] [phone] Way[place] Denis) Quintella Baton 01/28/2022, 1:48 PM                      Scheduled Meds:  apixaban  2.5 mg Oral BID   aspirin  81 mg Oral Daily   chlorhexidine  15 mL Mouth Rinse BID   Chlorhexidine Gluconate Cloth  6 each Topical Daily   furosemide  40 mg Oral Daily   insulin aspart  0-9 Units Subcutaneous Q8H   ipratropium-albuterol  3 mL Nebulization BID   lidocaine  1 patch Transdermal Q24H   mouth rinse  15 mL Mouth Rinse q12n4p   melatonin  10 mg Oral QHS   mometasone-formoterol  2 puff Inhalation BID   And   umeclidinium bromide  1 puff Inhalation Daily   pantoprazole  40 mg Oral QHS   rOPINIRole  4 mg Oral QHS   sodium chloride flush  10-40 mL Intracatheter Q12H   Continuous Infusions:  sodium chloride Stopped (01/17/22 1742)   sodium chloride     cefTRIAXone (ROCEPHIN)  IV Stopped (01/28/22 1631)   TPN ADULT (ION) 70 mL/hr at 01/29/22 0219   TPN ADULT (ION)       LOS: 12 days    Time spent: 35 mins    Tacey Dimaggio, MD [facility]   If 7PM-7AM, please contact night-coverage

## 2022-01-30 DIAGNOSIS — J189 Pneumonia, unspecified organism: Secondary | ICD-10-CM | POA: Diagnosis not present

## 2022-01-30 LAB — GLUCOSE, CAPILLARY
Glucose-Capillary: 124 mg/dL — ABNORMAL HIGH (ref 70–99)
Glucose-Capillary: 84 mg/dL (ref 70–99)
Glucose-Capillary: 92 mg/dL (ref 70–99)
Glucose-Capillary: 114 mg/dL — ABNORMAL HIGH (ref 70–99)
Glucose-Capillary: 84 mg/dL (ref 70–99)
Glucose-Capillary: 73 mg/dL (ref 70–99)

## 2022-01-30 MED ORDER — MORPHINE SULFATE (PF) 2 MG/ML IV SOLN
2.0000 mg | INTRAVENOUS | Status: DC | PRN
Start: 2022-01-30 — End: 2022-01-30

## 2022-01-30 MED ORDER — MORPHINE SULFATE (PF) 2 MG/ML IV SOLN
1.0000 mg | INTRAVENOUS | Status: DC | PRN
  Administered 2022-01-30 – 2022-01-31 (×5): 1 mg via INTRAVENOUS
  Filled 2022-01-30 (×6): qty 1

## 2022-01-30 MED ORDER — DIPHENHYDRAMINE HCL 50 MG/ML IJ SOLN
25.0000 mg | Freq: Four times a day (QID) | INTRAMUSCULAR | Status: DC | PRN
  Administered 2022-01-30: 25 mg via INTRAVENOUS

## 2022-01-30 NOTE — Progress Notes (Addendum)
[facility] ([facility])  There is not a bed at [facility] today to offer Kristin [name].  Updated hospital staff as well family. Likely, we will have a bed Sunday am.  Thank you, Venia Carbon DNP, RN [facility]   **addendum 405pm, patient is approved for [facility] and there will be a bed available on 6.11.23. Family and hospital staff updated. [facility] will update TOC manager on 6.11.23 when transport can be arranged.

## 2022-01-30 NOTE — TOC Progression Note (Signed)
[facility]) - Progression Note    Patient Details  Name: PIPER HASSEBROCK MRN: [id number] Date of Birth: 12-Jan-1930  Transition of Care [facility]) CM/SW [place], [place] Phone Number: [phone] 01/30/2022, 9:40 AM  Clinical Narrative:     Pending hospice bed confirmation at [facility] w/ [facility]  Expected Discharge Plan:  (TBD) Barriers to Discharge: Continued Medical Work up  Expected Discharge Plan and Services Expected Discharge Plan:  (TBD)   Discharge Planning Services: CM Consult   Living arrangements for the past 2 months: Single Family Home                 DME Arranged: N/A                     Social Determinants of Health (SDOH) Interventions    Readmission Risk Interventions    01/22/2022   12:27 PM 11/06/2021    4:09 PM 09/21/2021   11:49 AM  Readmission Risk Prevention Plan  Transportation Screening Complete Complete Complete  PCP or Specialist Appt within 3-5 Days   Complete  HRI or [place]   Complete  Social Work Consult for [place] Planning/Counseling   Complete  Palliative Care Screening   Not Applicable  Medication Review Press photographer) Complete Complete Complete  PCP or Specialist appointment within 3-5 days of discharge Complete Complete   HRI or [place]  Complete   SW Recovery Care/Counseling Consult Not Complete Not Complete   SW Consult Not Complete Comments NA- palliative to meet with patient and family RNCM assigned to case   Palliative Care Screening Complete Not [place]  Not Applicable

## 2022-01-30 NOTE — Progress Notes (Signed)
PROGRESS NOTE    Kristin [name]  ZOX:[id number] DOB: [birth year] DOA: 01/16/2022  PCP: [name], MD   Brief Narrative:  This 88 years old female with PMH significant for COPD, chronic diastolic CHF, lung cancer, CKD stage IIIa presented in the ED with c/o: lethargy and weakness.  Patient was sleeping more than usual, In the ED patient was found to have multifocal patchy opacities on chest x-ray,  there was suspected tumor in the right upper lobe with superimposed multifocal pneumonia.  Patient decompensated and required mechanical ventilation for airway protection.  Patient displayed seizure-like activity while on the ventilator.  She was loaded with Keppra and neurology was consulted.  EEG showed diffuse encephalopathy of indeterminate etiology.  Oncology was also consulted as patient carries a known diagnosis of small cell lung cancer.  As per Dr. Rogue Bussing patient is not a candidate for chemotherapy.  Patient has overall poor prognosis.  CT head and neck was negative for any brain mets.  Patient also had atrial fibrillation with RVR, started on heparin drip.  She was extubated on 01/21/2022.  Patient overall was less communicative,  sedating medications were discontinued.  Palliative care was consulted, family wished to maintain full scope of care.  Family is also agreeable to the PEG tube.  Speech and swallow felt that patient is at high risk for aspiration recommendation was NPO.  Patient is currently getting TPN for this reason..  Family meeting conducted, goals of care discussed.  Family has agreed for comfort care.  Care transitioned to comfort care.  Patient will be discharged to home hospice pending eligibility.  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 to suspected aspiration in the setting of possible CAP and acute HFpEF exacerbation. Continue supplemental oxygen, saturating 91% on 5 L.  Acute on chronic HFpEF exacerbation, POA: Patient has mildly elevated troponin sec. to suspected demand ischemia. Continue cardiac monitoring. Daily weight, intake output charting. Continue IV Lasix for diuresis.  Paroxysmal atrial fibrillation: Continue cardiac monitoring.   Heart rate is controlled, continue Eliquis.  Acute kidney injury on CKD stage IIIb: Baseline serum creatinine 1.18, creatinine on admission 1.50. Avoid nephrotoxic medications and hypotension Serum creatinine improved and back to baseline.  Acute encephalopathy unknown etiology CT head without contrast : No brain mets noted. Consider EEG /MRI brain if mentation does not improve with oxygenation support. Avoid sedating medications as able.  Dysphagia: Patient was initially evaluated by speech on 01/23/22. She was felt to be at high risk for aspiration.  Recommendation was for NPO. Patient is currently receiving TPN for this reason.   Patient was reevaluated by speech/ underwent MBS, recommended NPO, consider alternate route of feeding.  Gabriel Carina UTI: Patient has completed antibiotics treatment. UA+ still.  Non-small cell lung CA: Appreciate Dr. Aletha Halim assistance.  Patient has completed course of palliative radiation. CT head without contrast shows no any  signs of brain mets.  Goals of care discussion: Patient  has poor prognosis.  It was explained to the granddaughter [place]. Palliative care was notified.  Family wants to discuss in detail about goals of care Family meeting completed , goals of care discussed. Family has agreed for comfort care.  Care transitioned to comfort care. Patient is awaiting bed at home hospice Pending eligibility.   DVT prophylaxis:Eliquis Code Status: Full code. Family Communication: Spoke with granddaughter [name] on phone. Disposition Plan:   Status is: Inpatient Remains inpatient appropriate because:   Admitted for acute encephalopathy which has been resolved. Completed antibiotics for pneumonia and UTI.  Patient is now getting TPN for dysphagia and poor nutrition.   Palliative care consulted to discuss goals of care. Care transitioned to comfort care.  Patient awaiting bed at home hospice.  Consultants:  PCCM Oncology Neurology  speech  Procedures: MRI, Mechanical ventilation and extubation, PICC line, TPN  Antimicrobials:  Anti-infectives (From admission, onward)    Start     Dose/Rate Route Frequency Ordered Stop   01/28/22 1415  cefTRIAXone (ROCEPHIN) 1 g in sodium chloride 0.9 % 100 mL IVPB        1 g 200 mL/hr over 30 Minutes Intravenous Every 24 hours 01/28/22 1327     01/20/22 1800  levofloxacin (LEVAQUIN) IVPB 750 mg        750 mg 100 mL/hr over 90 Minutes Intravenous Every 48 hours 01/20/22 1447 01/22/22 1909   01/19/22 1800  Ampicillin-Sulbactam (UNASYN) 3 g in sodium chloride 0.9 % 100 mL IVPB  Status:  Discontinued        3 g 200 mL/hr over 30 Minutes Intravenous Every 6 hours 01/19/22 1427 01/20/22 1443   01/19/22 0600  vancomycin (VANCOREADY) IVPB 1250 mg/250 mL  Status:  Discontinued        1,250 mg 83.3 mL/hr over 180 Minutes Intravenous Every 48 hours 01/17/22 0508 01/17/22 1045   01/19/22 0600  vancomycin (VANCOREADY) IVPB 1500 mg/300 mL  Status:  Discontinued        1,500  mg 75 mL/hr over 240 Minutes Intravenous Every 48 hours 01/17/22 1045 01/18/22 1339   01/18/22 1500  Ampicillin-Sulbactam (UNASYN) 3 g in sodium chloride 0.9 % 100 mL IVPB  Status:  Discontinued        3 g 200 mL/hr over 30 Minutes Intravenous Every 12 hours 01/18/22 1339 01/19/22 1427   01/18/22 0100  cefTRIAXone (ROCEPHIN) 2 g in sodium chloride 0.9 % 100 mL IVPB  Status:  Discontinued        2 g 200 mL/hr over 30 Minutes Intravenous Every 24 hours 01/17/22 0121 01/17/22 0445   01/17/22 0545  piperacillin-tazobactam (ZOSYN) IVPB 3.375 g  Status:  Discontinued        3.375 g 12.5 mL/hr over 240 Minutes Intravenous Every 8 hours 01/17/22 0455 01/18/22 1339   01/17/22 0545  vancomycin (VANCOREADY) IVPB 1750 mg/350 mL        1,750 mg 87.5 mL/hr over 240 Minutes Intravenous  Once 01/17/22 0457 01/17/22 0929   01/17/22 0145  cefTRIAXone (ROCEPHIN) 1 g in sodium chloride 0.9 % 100 mL IVPB  Status:  Discontinued       Note to Pharmacy: Give following initial 1 gm dose to make total dose of 2 gm .   1 g 200 mL/hr over 30 Minutes Intravenous  Once 01/17/22 0136 01/17/22 0445  01/17/22 0130  azithromycin (ZITHROMAX) 500 mg in sodium chloride 0.9 % 250 mL IVPB  Status:  Discontinued        500 mg 250 mL/hr over 60 Minutes Intravenous Every 24 hours 01/17/22 0121 01/17/22 0445   01/17/22 0045  cefTRIAXone (ROCEPHIN) 1 g in sodium chloride 0.9 % 100 mL IVPB        1 g 200 mL/hr over 30 Minutes Intravenous  Once 01/17/22 0034 01/17/22 0155   01/17/22 0045  azithromycin (ZITHROMAX) 500 mg in sodium chloride 0.9 % 250 mL IVPB        500 mg 250 mL/hr over 60 Minutes Intravenous  Once 01/17/22 0034 01/17/22 0302        Subjective: Patient was seen and examined at bedside.  Overnight events noted. Patient appears very weak, frail, chronically ill looking and deconditioned. She is alert, following commands, still producing a lot of phlegm, denies any chest pain. Overnight she has a fever 100.4  resolved with Tylenol. Care transitioned to comfort care.   Objective: Vitals:   01/29/22 2039 01/30/22 0500 01/30/22 0800 01/30/22 0850  BP:   (!) 114/52   Pulse: 89  88   Resp: 20  16   Temp:   98.9 F (37.2 C)   TempSrc:   Oral   SpO2: 93%  93% 94%  Weight:  71.4 kg    Height:        Intake/Output Summary (Last 24 hours) at 01/30/2022 1227 Last data filed at 01/30/2022 0217 Gross per 24 hour  Intake 451.21 ml  Output 1600 ml  Net -1148.79 ml   Filed Weights   01/27/22 0509 01/29/22 0500 01/30/22 0500  Weight: 77 kg 68 kg 71.4 kg    Examination:  General exam: Appears chronically ill looking, frail, deconditioned, not in any distress. Respiratory system: CTA bilaterally, no accessory muscle use, respiratory effort normal. Cardiovascular system: S1 & S2 heard, regular rate and rhythm, no murmur. Gastrointestinal system: Abdomen is soft, non tender, non distended, BS+ Central nervous system: Alert and oriented x 2, no focal neurological deficits. Extremities: No edema, no cyanosis, no clubbing. Skin: No rashes, lesions or ulcers Psychiatry:  Mood & affect appropriate.     Data Reviewed: I have personally reviewed following labs and imaging studies  CBC: Recent Labs  Lab 01/24/22 0443 01/25/22 0739 01/26/22 0608 01/27/22 0452 01/28/22 0500  WBC 5.4 5.5 6.2 6.8 7.6  NEUTROABS 3.8 3.5 4.1 4.6  --   HGB 9.1* 9.4* 9.3* 9.5* 9.5*  HCT 29.1* 30.3* 30.0* 30.2* 30.4*  MCV 85.8 86.1 85.7 86.0 85.4  PLT 146* 154 162 182 086   Basic Metabolic Panel: Recent Labs  Lab 01/24/22 0443 01/25/22 0739 01/27/22 0452 01/28/22 0500 01/29/22 0509 01/29/22 0930  NA 136 139 139 137 138  --   K 3.5 4.0 4.6 4.7 5.4* 5.1  CL 109 112* 111 105 104  --   CO2 23 23 25 28 28   --   GLUCOSE 107* 111* 132* 121* 133*  --   BUN 19 23 32* 39* 50*  --   CREATININE 0.75 0.64 0.74 0.75 1.25*  --   CALCIUM 9.4 9.6 9.5 9.7 9.5  --   MG 2.0 2.0  --  2.1 2.1  --   PHOS 2.8 3.0  --  4.3  4.2  --    GFR: Estimated Creatinine Clearance: 29.1 mL/min (A) (by C-G formula based on SCr of 1.25 mg/dL (H)). Liver Function Tests: Recent Labs  Lab 01/24/22 0443 01/25/22 0739 01/28/22 0500  AST 19 16 19   ALT 14 12 9   ALKPHOS 62 59 58  BILITOT 0.7 0.2* 0.5  PROT 5.8* 5.8* 6.5  ALBUMIN 2.3* 2.2* 2.4*   No results for input(s): "LIPASE", "AMYLASE" in the last 168 hours. No results for input(s): "AMMONIA" in the last 168 hours. Coagulation Profile: No results for input(s): "INR", "PROTIME" in the last 168 hours. Cardiac Enzymes: No results for input(s): "CKTOTAL", "CKMB", "CKMBINDEX", "TROPONINI" in the last 168 hours. BNP (last 3 results) No results for input(s): "PROBNP" in the last 8760 hours. HbA1C: No results for input(s): "HGBA1C" in the last 72 hours. CBG: Recent Labs  Lab 01/29/22 1655 01/29/22 2122 01/30/22 0600 01/30/22 0836 01/30/22 1209  GLUCAP 109* 153* 124* 84 92   Lipid Profile: Recent Labs    01/28/22 0500  TRIG 35   Thyroid Function Tests: No results for input(s): "TSH", "T4TOTAL", "FREET4", "T3FREE", "THYROIDAB" in the last 72 hours. Anemia Panel: No results for input(s): "VITAMINB12", "FOLATE", "FERRITIN", "TIBC", "IRON", "RETICCTPCT" in the last 72 hours. Sepsis Labs: No results for input(s): "PROCALCITON", "LATICACIDVEN" in the last 168 hours.  No results found for this or any previous visit (from the past 240 hour(s)).  Radiology Studies: No results found.  Scheduled Meds:  apixaban  2.5 mg Oral BID   aspirin  81 mg Oral Daily   chlorhexidine  15 mL Mouth Rinse BID   Chlorhexidine Gluconate Cloth  6 each Topical Daily   furosemide  40 mg Oral Daily   insulin aspart  0-9 Units Subcutaneous Q8H   ipratropium-albuterol  3 mL Nebulization BID   lidocaine  1 patch Transdermal Q24H   mouth rinse  15 mL Mouth Rinse q12n4p   melatonin  10 mg Oral QHS   mometasone-formoterol  2 puff Inhalation BID   And   umeclidinium bromide  1 puff  Inhalation Daily   pantoprazole  40 mg Oral QHS   rOPINIRole  4 mg Oral QHS   sodium chloride flush  10-40 mL Intracatheter Q12H   Continuous Infusions:  sodium chloride Stopped (01/17/22 1742)   sodium chloride     cefTRIAXone (ROCEPHIN)  IV 1 g (01/29/22 1512)     LOS: 13 days    Time spent: 35 mins    Jeffren Dombek, MD [facility]   If 7PM-7AM, please contact night-coverage

## 2022-01-31 DIAGNOSIS — J189 Pneumonia, unspecified organism: Secondary | ICD-10-CM | POA: Diagnosis not present

## 2022-01-31 LAB — GLUCOSE, CAPILLARY
Glucose-Capillary: 73 mg/dL (ref 70–99)
Glucose-Capillary: 67 mg/dL — ABNORMAL LOW (ref 70–99)
Glucose-Capillary: 98 mg/dL (ref 70–99)
Glucose-Capillary: 79 mg/dL (ref 70–99)

## 2022-01-31 MED ORDER — ACETAMINOPHEN 650 MG RE SUPP: 650.0000 mg | Freq: Four times a day (QID) | RECTAL | 0 refills | Status: AC | PRN

## 2022-01-31 MED ORDER — DEXTROSE 50 % IV SOLN
12.5000 g | INTRAVENOUS | Status: AC
  Administered 2022-01-31: 12.5 g via INTRAVENOUS
  Filled 2022-01-31: qty 50

## 2022-01-31 NOTE — Progress Notes (Signed)
EMS here to transport pt, son and granddaughter at bedside.

## 2022-01-31 NOTE — Plan of Care (Signed)
  Problem: Clinical Measurements: Goal: Ability to maintain a body temperature in the normal range will improve Outcome: Progressing   Problem: Respiratory: Goal: Ability to maintain adequate ventilation will improve Outcome: Progressing Goal: Ability to maintain a clear airway will improve Outcome: Progressing   Problem: Clinical Measurements: Goal: Ability to maintain clinical measurements within normal limits will improve Outcome: Progressing Goal: Will remain free from infection Outcome: Progressing Goal: Respiratory complications will improve Outcome: Progressing Goal: Cardiovascular complication will be avoided Outcome: Progressing   Problem: Coping: Goal: Level of anxiety will decrease Outcome: Progressing   Problem: Elimination: Goal: Will not experience complications related to bowel motility Outcome: Progressing Goal: Will not experience complications related to urinary retention Outcome: Progressing   Problem: Pain Managment: Goal: General experience of comfort will improve Outcome: Progressing   Problem: Safety: Goal: Ability to remain free from injury will improve Outcome: Progressing   Problem: Skin Integrity: Goal: Risk for impaired skin integrity will decrease Outcome: Progressing   Problem: Metabolic: Goal: Ability to maintain appropriate glucose levels will improve Outcome: Progressing   Problem: Tissue Perfusion: Goal: Adequacy of tissue perfusion will improve Outcome: Progressing   Problem: Activity: Goal: Ability to tolerate increased activity will improve Outcome: Not Progressing   Problem: Education: Goal: Knowledge of General Education information will improve Description: Including pain rating scale, medication(s)/side effects and non-pharmacologic comfort measures Outcome: Not Progressing   Problem: Health Behavior/Discharge Planning: Goal: Ability to manage health-related needs will improve Outcome: Not Progressing   Problem:  Clinical Measurements: Goal: Diagnostic test results will improve Outcome: Not Progressing   Problem: Activity: Goal: Risk for activity intolerance will decrease Outcome: Not Progressing   Problem: Nutrition: Goal: Adequate nutrition will be maintained Outcome: Not Progressing   Problem: Education: Goal: Ability to describe self-care measures that may prevent or decrease complications (Diabetes Survival Skills Education) will improve Outcome: Not Progressing Goal: Individualized Educational Video(s) Outcome: Not Progressing   Problem: Coping: Goal: Ability to adjust to condition or change in health will improve Outcome: Not Progressing   Problem: Fluid Volume: Goal: Ability to maintain a balanced intake and output will improve Outcome: Not Progressing   Problem: Health Behavior/Discharge Planning: Goal: Ability to identify and utilize available resources and services will improve Outcome: Not Progressing Goal: Ability to manage health-related needs will improve Outcome: Not Progressing   Problem: Nutritional: Goal: Maintenance of adequate nutrition will improve Outcome: Not Progressing Goal: Progress toward achieving an optimal weight will improve Outcome: Not Progressing   Problem: Skin Integrity: Goal: Risk for impaired skin integrity will decrease Outcome: Not Progressing

## 2022-01-31 NOTE — TOC Transition Note (Signed)
Transition of Care [facility]) - CM/SW Discharge Note   Patient Details  Name: TISHA CLINE MRN: [id number] Date of Birth: [birth year]  [facility] CM/SW Contact:  Magnus Ivan, LCSW Phone Number: 01/31/2022, 9:15 AM   Clinical Narrative:   Patient is discharging to [facility] in [place] today.  Confirmed with [facility] Representative Olivia Mackie and she has already updated family. EMS paperwork completed. [facility] arranged for 12 pick up per hospice request.    Final next level of care: [place] Barriers to Discharge: Barriers Resolved   Patient Goals and CMS Choice Patient states their goals for this hospitalization and ongoing recovery are:: patient is not completely oriented      Discharge Placement              Patient chooses bed at: Other - please specify in the comment section below: ([facility]) Patient to be transferred to facility by: [facility] Name of family member notified: family notified by Margaretmary Eddy with [facility] Patient and family notified of of transfer: 01/31/22  Discharge Plan and Services   Discharge Planning Services: CM Consult            DME Arranged: N/A                    Social Determinants of Health (SDOH) Interventions     Readmission Risk Interventions    01/22/2022   12:27 PM 11/06/2021    4:09 PM 09/21/2021   11:49 AM  Readmission Risk Prevention Plan  Transportation Screening Complete Complete Complete  PCP or Specialist Appt within 3-5 Days   Complete  HRI or [place]   Complete  Social Work Consult for [place] Planning/Counseling   Complete  Palliative Care Screening   Not Applicable  Medication Review Press photographer) Complete Complete Complete  PCP or Specialist appointment within 3-5 days of discharge Complete Complete   HRI or [place]  Complete   SW Recovery Care/Counseling Consult Not Complete Not Complete   SW Consult Not  Complete Comments NA- palliative to meet with patient and family RNCM assigned to case   Palliative Care Screening Complete Not [place]  Not Applicable

## 2022-01-31 NOTE — Discharge Instructions (Signed)
Patient being discharged to home hospice.

## 2022-01-31 NOTE — Progress Notes (Signed)
Report called to Jenny Reichmann, RN @ Hospice

## 2022-01-31 NOTE — Discharge Summary (Signed)
Physician Discharge Summary  Kristin [name] ZGY:[id number] DOB: 20-May-1930 DOA: 01/16/2022  PCP: [name], MD  Admit date: 01/16/2022  Discharge date: 01/31/2022  Admitted From: Home.  Disposition: Hospice home.  Recommendations for Outpatient Follow-up:  Follow up with PCP in 1-2 weeks Please obtain BMP/CBC in one week Patient is being discharged to home hospice given poor prognosis.  Home Health: Home hospice Equipment/Devices: Supplemental oxygen, PICC line  Discharge Condition: Fair CODE STATUS:DNR Diet recommendation: Dysphagia 1 diet  Brief Summary/ Hospital Course: This 88 years old female with PMH significant for COPD, chronic diastolic CHF, lung cancer, CKD stage IIIa presented in the ED with c/o: lethargy and weakness. Patient was sleeping more than usual, In the ED patient was found to have multifocal patchy opacities on chest x-ray,  there was suspected tumor in the right upper lobe with superimposed multifocal pneumonia.  Patient decompensated and required mechanical ventilation for airway protection.  Patient displayed seizure-like activity while on the ventilator.  She was loaded with Keppra and neurology was consulted.  EEG showed diffuse encephalopathy of indeterminate etiology. Oncology was also consulted as patient carries a known diagnosis of small cell lung cancer.  As per Dr. Rogue Bussing patient is not a candidate for chemotherapy. Patient has overall poor prognosis.  Palliative care consulted to discuss goals of care.  Family wanted full scope of care.  CT head and neck was negative for any brain mets.  Patient also had atrial fibrillation with RVR, started on heparin drip.  She was extubated on 01/21/2022.  Patient overall was less communicative, sedating medications were discontinued.  Palliative care was consulted, family wished to maintain full scope of care.  Family was also agreeable to the PEG tube.  Speech and swallow felt that patient is at high risk for  aspiration,  recommendation was NPO.  Patient is currently getting TPN for this reason..  Family meeting conducted, goals of care discussed. Finally family has understood the severity of condition and  prognosis. Family has agreed for comfort care.  Care transitioned to comfort care.  Patient is being discharged to home hospice today.  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 to suspected aspiration in the setting of possible CAP and acute HFpEF exacerbation. Continue supplemental oxygen, saturating 91% on 5 L.   Acute on chronic HFpEF exacerbation, POA: Patient has mildly elevated troponin sec. to suspected demand ischemia. Continue cardiac monitoring. Daily weight, intake output charting. Care transitioned to comfort care.   Paroxysmal atrial fibrillation: Continue cardiac monitoring.   Heart rate is controlled, DC Eliquis. Care transitioned to comfort care   Acute kidney injury on CKD stage IIIb: Baseline serum creatinine 1.18, creatinine on admission 1.50. Avoid nephrotoxic medications and hypotension Serum creatinine improved and back to baseline.   Acute encephalopathy unknown etiology CT head  without contrast : No brain mets  noted. Consider EEG /MRI brain if mentation does not improve with oxygenation support. Avoid sedating medications as able.   Dysphagia: Patient was initially evaluated by speech on 01/23/22. She was felt to be at high risk for aspiration.  Recommendation was for NPO. Patient is currently receiving TPN for this reason.   Patient was reevaluated by speech/ underwent MBS, recommended NPO, consider alternate route of feeding. Care transitioned to comfort care.   Gabriel Carina UTI: Patient has completed antibiotics treatment. UA+ still.   Non-small cell lung CA: Appreciate Dr. Aletha Halim assistance. Patient has completed course of palliative radiation. CT head without contrast shows no any signs of brain mets.   Goals of care discussion: Patient  has poor prognosis.  It was explained to the granddaughter [name]. Palliative care was notified.  Family wants to discuss in detail about goals of care Family meeting completed , goals of care discussed. Family has agreed for comfort care.  Care transitioned to comfort care. Patient is being discharged to home hospice.  Discharge Instructions  Discharge Instructions     Call MD for:  difficulty breathing, headache or visual disturbances   Complete by: As directed    Diet - low sodium heart healthy   Complete by: As directed    Diet general   Complete by: As directed    Discharge instructions   Complete by: As directed    Patient is being discharged to home hospice.   Discharge wound care:   Complete by: As directed    Discharge to home hospice   Increase activity slowly   Complete by: As directed       Allergies as of 01/31/2022       Reactions   Flexeril [cyclobenzaprine Hcl]    Severe Rash   Vancomycin Rash   [place] Syndrome        Medication List     STOP taking these medications    acetaminophen 500 MG tablet Commonly known as: TYLENOL Replaced by: acetaminophen 650 MG suppository   apixaban 2.5 MG Tabs  tablet Commonly known as: ELIQUIS   atorvastatin 20 MG tablet Commonly known as: LIPITOR   bisacodyl 5 MG EC tablet Commonly known as: DULCOLAX   Breztri Aerosphere 160-9-4.8 MCG/ACT Aero Generic drug: Budeson-Glycopyrrol-Formoterol   diclofenac Sodium 1 % Gel Commonly known as: Voltaren   empagliflozin 10 MG Tabs tablet Commonly known as: Jardiance   feeding supplement Liqd   furosemide 40 MG tablet Commonly known as: LASIX   gabapentin 300 MG capsule Commonly known as: NEURONTIN   GNP Aspirin Low Dose 81 MG tablet Generic drug: aspirin EC   levocetirizine 5 MG tablet Commonly known as: XYZAL   Melatonin 10 MG Tabs   multivitamin with minerals Tabs tablet   oxyCODONE 5 MG immediate release tablet Commonly known as: Oxy IR/ROXICODONE   rOPINIRole 2 MG tablet Commonly known as: REQUIP   Vitamin D 50 MCG (2000 UT) tablet       TAKE these medications    acetaminophen 650 MG suppository Commonly known as: TYLENOL Place 1 suppository (650 mg total) rectally every 6 (six) hours as needed for mild pain (or Fever >/= 101). Replaces: acetaminophen 500 MG tablet   albuterol (2.5 MG/3ML) 0.083% nebulizer solution Commonly known as: PROVENTIL USE 1 VIAL IN NEBULIZER EVERY 8 HOURS AS NEEDED FOR WHEEZING OR SHORTNESS OF BREATH.   OXYGEN Inhale 2 L into the lungs daily.  Discharge Care Instructions  (From admission, onward)           Start     Ordered   01/31/22 0000  Discharge wound care:       Comments: Discharge to home hospice   01/31/22 0817            Follow-up Information     [facility]   Specialty: Hospice Services Contact information: [place] [address] [phone]                Allergies  Allergen Reactions   Flexeril [Cyclobenzaprine Hcl]     Severe Rash   Vancomycin Rash    [place] Syndrome    Consultations: PCCM, palliative  care  Procedures/Studies: DG Swallowing Func-Speech Pathology  Result Date: 01/28/2022 Quintella Baton, CCC-SLP     01/28/2022  1:48 PM Objective Swallowing Evaluation: Type of Study: Bedside Swallow Evaluation  Patient Details Name: ELLEANNA MELLING MRN: [id number] Date of Birth: [birth year] Today's Date: 01/28/2022 Time: SLP Start Time (ACUTE ONLY): 5993 -SLP Stop Time (ACUTE ONLY): 1055 SLP Time Calculation (min) (ACUTE ONLY): 40 min Past Medical History: Past Medical History: Diagnosis Date  Acute postoperative pain 02/09/2018  Arrhythmia, sinus node 04/03/2014  Arthritis   Arthritis   Atrial fibrillation (HCC)   Back pain   lower back chronic  Bradycardia   Breast cancer ([place]) 2004  right breast cancer  Cancer ([place]) 2004  rt breast cancer-post lumpectomy- chemo/rad  CHF (congestive heart failure) (HCC)   Chronic back pain   CKD (chronic kidney disease)   COPD (chronic obstructive pulmonary disease) ([place])   wears O2 at 2L via [place] at night  COVID-19 06/11/2020  Cystocele   Decubitus ulcers   Dehydration   Gout   Hematuria   Hepatitis C   Hyperlipidemia   Hypertension   Hypomagnesemia 06/25/2015  NSTEMI (non-ST elevated myocardial infarction) ([place]) 04/08/2021  OAB (overactive bladder)   Overactive bladder   Overdose opiate, accidental or unintentional, sequela 10/02/2020  Restless leg   Shoulder pain, bilateral   Urinary frequency   UTI (lower urinary tract infection)   Vaginal atrophy  Past Surgical History: Past Surgical History: Procedure Laterality Date  ABDOMINAL HYSTERECTOMY    BACK SURGERY    BREAST LUMPECTOMY Right 2004  BREAST SURGERY    rt lumpectomy  CARPAL TUNNEL RELEASE    CATARACT EXTRACTION W/PHACO  10/19/2011  Procedure: CATARACT EXTRACTION PHACO AND INTRAOCULAR LENS PLACEMENT ([place]);  Surgeon: Elta Guadeloupe T. Gershon Crane, MD;  Location: [facility];  Service: Ophthalmology;  Laterality: Right;  CDE:10.81  CATARACT EXTRACTION W/PHACO  11/02/2011  Procedure: CATARACT EXTRACTION PHACO AND INTRAOCULAR LENS PLACEMENT ([facility]);   Surgeon: Elta Guadeloupe T. Gershon Crane, MD;  Location: [facility];  Service: Ophthalmology;  Laterality: Left;  CDE 8.60  CHOLECYSTECTOMY    3/18  COLON SURGERY    INSERT / REPLACE / REMOVE PACEMAKER    JOINT REPLACEMENT    bilateral TKA  LOWER EXTREMITY ANGIOGRAPHY Left 10/14/2020  Procedure: LOWER EXTREMITY ANGIOGRAPHY;  Surgeon: Katha Cabal, MD;  Location: [facility];  Service: Cardiovascular;  Laterality: Left;  LOWER EXTREMITY ANGIOGRAPHY Right 09/08/2021  Procedure: LOWER EXTREMITY ANGIOGRAPHY;  Surgeon: Katha Cabal, MD;  Location: [facility];  Service: Cardiovascular;  Laterality: Right;  PACEMAKER INSERTION N/A 12/24/2015  Procedure: INSERTION PACEMAKER;  Surgeon: Isaias Cowman, MD;  Location: [facility];  Service: Cardiovascular;  Laterality: N/A;  ROTATOR CUFF REPAIR  bilateral  VIDEO BRONCHOSCOPY WITH ENDOBRONCHIAL NAVIGATION Right 11/11/2021  Procedure: VIDEO BRONCHOSCOPY WITH ENDOBRONCHIAL NAVIGATION;  Surgeon: Ottie Glazier, MD;  Location: [facility];  Service: Thoracic;  Laterality: Right; HPI: Per admitting H&P "MARICARMEN BRAZIEL is a 88 y.o. 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  Son at bedside and contributes to history.  Patient unable to arouse to answer questions  ED course and data review: Vitals within normal limits on arrival.  ABG on 4 L shows pH 7.27 with PCO2 of 50 and PO2 of 60.  CBC unremarkable and lactic acid 1.5.  CMP significant for creatinine of 1.5 above baseline of 1.18.  Troponin 62.  COVID-negative.  EKG, personally viewed and interpreted shows sinus rhythm at 64 with no acute ST-T wave changes.  Chest x-ray shows the following:   IMPRESSION:  Multifocal patchy opacities, progressive when compared to prior.  While some of this may reflect tumor in the right upper lobe,  superimposed multifocal pneumonia is suspected."  Subjective: Pt alert, confusion evident. Limited and very inconsistent ability to follow directions. On 5L/min O2 via [place].  Recommendations for follow up therapy are one component of a multi-disciplinary discharge planning process, led by the attending physician.  Recommendations may be updated based on patient status, additional functional criteria and insurance authorization. Assessment / Plan / Recommendation   01/28/2022   1:31 PM Clinical Impressions Clinical Impression Pt seen for repeat MBSS. Pt alert, confusion evident. Limited and very inconsistent ability to follow directions. On 5L/min O2 via . Pt presents with a severe oropharyngeal dysphagia. Pt given trials of thin liquids (via teaspoon and cup sip), nectar-thick liquids (via tsp and cup sip), and pureed. Solid not assessed due to severity of oropharyngeal dysphagia. Oral deficits c/b oral holding and delayed oral transit across all textures, piecemeal swallow with cup sip of nectar-thick liquids, and pre-swallow loss of pudding with significant pharyngeal pooling. Oral deficits secondary to lingual weakness/incoordination and likely exacerbated by AMS. Pharyngeal deficits c/b swallow initiation at the level of the vallecula with all textures except puree where pharyngeal swallow was initiated at the level of the pyriform sinus after significant pharyngeal pooling, trace-severe vallecular stasis which was most appreciated with pudding, trace-mild pyriform sinus residual across all textures, and mild posterior pharyngeal wall residual with pudding. Additionally, pt with during the swallow laryngeal penetration above the vocal cords and after the swallow penetration to the level of the true vocal cords across all textures; pt with after the swallow aspiration of thin  liquids via teaspoon when used as liquid wash. Pt with inconsistent and weak throat clear response to penetration/aspiration. Pharyngeal deficits secondary to reduced base of tongue retraction, reduced hyolaryngeal elevation, reduced pharyngeal constriction, reduced laryngeal vestibule closure, reduced pharyngeal and laryngeal sensation, and weak/ineffective throat clearing response. Attempted compensatory strategies of use of secondry swallow to aid in pharyngeal clearance, liquid wash to aid in pharyngeal clearance, use of cued throat clear/cough to clear penetration/aspiration,. Pt unable to follow commands for secondary swallows or cued throat clear/cough. Teaspoon of thin liquid wash resulted in after the swallow aspiration of thin liquids which were presumed to be mixed with pudding residual. A safe oral diet cannot be recommended  at this time given severity of pt's oropharyngeal dysphagia and overall pattern of pt's swallowing physiology. Recommend NPO with GOC discussion regarding comfort diet recommendations vs long term alternate means of nutrition/hydration/medication. Regardless, of means of nutrition/hydration/medication, pt remains at high risk for aspiration/aspiration PNA given dental status, mental status, and multiple medical comorbidities. Presence of long term alternate route of nutrition/hydration/medication does not eliminate risk of aspiration/aspiration PNA. RN and MD made aware of results, recommendations, and SLP POC. SLP to sign off as pt has no further SLP needs at this time.     01/28/2022   1:16 PM Treatment Recommendations Treatment Recommendations No treatment recommended at this time     01/28/2022   1:17 PM Prognosis Prognosis for Safe Diet Advancement Guarded Barriers to Reach Goals Severity of deficits;Cognitive deficits;Time post onset;Behavior   01/28/2022   1:16 PM Diet Recommendations SLP Diet Recommendations NPO Medication Administration Via alternative means     01/28/2022   1:31 PM  Other Recommendations Recommended Consults --   01/23/2022  10:00 AM Frequency and Duration  Speech Therapy Frequency (ACUTE ONLY) min 5x/week Treatment Duration 1 week     01/28/2022   1:21 PM Oral Phase Oral Phase Impaired Oral - Nectar Teaspoon Holding of bolus;Delayed oral transit Oral - Nectar Cup Holding of bolus;Delayed oral transit;Piecemeal swallowing Oral - Nectar Straw NT Oral - Thin Teaspoon Holding of bolus;Delayed oral transit Oral - Thin Cup Holding of bolus;Delayed oral transit Oral - Thin Straw NT Oral - Puree Reduced posterior propulsion;Delayed oral transit;Premature spillage    01/28/2022   1:24 PM Pharyngeal Phase Pharyngeal Phase Impaired Pharyngeal- Nectar Teaspoon Delayed swallow initiation-vallecula;Reduced tongue base retraction;Reduced airway/laryngeal closure;Reduced laryngeal elevation;Penetration/Aspiration during swallow;Penetration/Apiration after swallow;Pharyngeal residue - valleculae;Pharyngeal residue - pyriform Pharyngeal Material enters airway, remains ABOVE vocal cords and not ejected out;Material enters airway, CONTACTS cords and not ejected out Pharyngeal- Nectar Cup Delayed swallow initiation-vallecula;Reduced tongue base retraction;Reduced airway/laryngeal closure;Reduced laryngeal elevation;Penetration/Aspiration during swallow;Penetration/Apiration after swallow;Pharyngeal residue - valleculae;Pharyngeal residue - pyriform Pharyngeal Material enters airway, remains ABOVE vocal cords and not ejected out;Material enters airway, CONTACTS cords and not ejected out Pharyngeal- Nectar Straw NT Pharyngeal- Thin Teaspoon Delayed swallow initiation-vallecula;Reduced tongue base retraction;Reduced airway/laryngeal closure;Penetration/Aspiration during swallow;Penetration/Apiration after swallow;Trace aspiration;Moderate aspiration;Pharyngeal residue - valleculae;Pharyngeal residue - pyriform Pharyngeal Material does not enter airway;Material enters airway, remains ABOVE vocal cords and  not ejected out;Material enters airway, CONTACTS cords and not ejected out;Material enters airway, passes BELOW cords then ejected out Pharyngeal- Thin Cup Delayed swallow initiation-vallecula;Reduced tongue base retraction;Reduced laryngeal elevation;Reduced airway/laryngeal closure;Penetration/Aspiration during swallow;Penetration/Apiration after swallow;Pharyngeal residue - valleculae;Pharyngeal residue - pyriform Pharyngeal Material enters airway, remains ABOVE vocal cords and not ejected out;Material enters airway, CONTACTS cords and not ejected out Pharyngeal- Thin Straw NT Pharyngeal- Puree Delayed swallow initiation-pyriform sinuses;Reduced tongue base retraction;Reduced airway/laryngeal closure;Reduced laryngeal elevation;Reduced pharyngeal peristalsis;Penetration/Aspiration during swallow;Penetration/Apiration after swallow;Pharyngeal residue - valleculae;Pharyngeal residue - pyriform;Pharyngeal residue - posterior pharnyx Pharyngeal Material enters airway, remains ABOVE vocal cords and not ejected out;Material enters airway, CONTACTS cords and not ejected out Pharyngeal Comment pt with inconsistent and weak throat clear response to penetration/aspiration; pt unable to follow commands for secondary swallows or cued throat clear/cough; liquid wash resulted in after the swallow aspiration of thin liquids which were presumed to be mixed with pudding residual    01/28/2022   1:20 PM Cervical Esophageal Phase  Cervical Esophageal Phase [facility] Cherrie Gauze, M.S., [facility] [phone] ([place]) Quintella Baton 01/28/2022, 1:48 PM  DG Swallowing Func-Speech Pathology  Result Date: 01/28/2022 Table formatting from the original result was not included. Images from the original result were not included. Objective Swallowing Evaluation: Type of Study: MBS-Modified Barium Swallow Study  Patient Details Name: VASTIE DOUTY  MRN: [id number] Date of Birth: 1930/01/18 Today's Date: 01/28/2022 Time: SLP Start Time (ACUTE ONLY): 65 -SLP Stop Time (ACUTE ONLY): 1443 SLP Time Calculation (min) (ACUTE ONLY): 47 min Past Medical History: Past Medical History: Diagnosis Date  Acute postoperative pain 02/09/2018  Arrhythmia, sinus node 04/03/2014  Arthritis   Arthritis   Atrial fibrillation (HCC)   Back pain   lower back chronic  Bradycardia   Breast cancer ([place]) 2004  right breast cancer  Cancer ([place]) 2004  rt breast cancer-post lumpectomy- chemo/rad  CHF (congestive heart failure) (HCC)   Chronic back pain   CKD (chronic kidney disease)   COPD (chronic obstructive pulmonary disease) ([place])   wears O2 at 2L via [place] at night  COVID-19 06/11/2020  Cystocele   Decubitus ulcers   Dehydration   Gout   Hematuria   Hepatitis C   Hyperlipidemia   Hypertension   Hypomagnesemia 06/25/2015  NSTEMI (non-ST elevated myocardial infarction) ([place]) 04/08/2021  OAB (overactive bladder)   Overactive bladder   Overdose opiate, accidental or unintentional, sequela 10/02/2020  Restless leg   Shoulder pain, bilateral   Urinary frequency   UTI (lower urinary tract infection)   Vaginal atrophy  Past Surgical History: Past Surgical History: Procedure Laterality Date  ABDOMINAL HYSTERECTOMY    BACK SURGERY    BREAST LUMPECTOMY Right 2004  BREAST SURGERY    rt lumpectomy  CARPAL TUNNEL RELEASE    CATARACT EXTRACTION W/PHACO  10/19/2011  Procedure: CATARACT EXTRACTION PHACO AND INTRAOCULAR LENS PLACEMENT ([place]);  Surgeon: Elta Guadeloupe T. Gershon Crane, MD;  Location: [facility];  Service: Ophthalmology;  Laterality: Right;  CDE:10.81  CATARACT EXTRACTION W/PHACO  11/02/2011  Procedure: CATARACT EXTRACTION PHACO AND INTRAOCULAR LENS PLACEMENT ([facility]);  Surgeon: Elta Guadeloupe T. Gershon Crane, MD;  Location: [facility];  Service: Ophthalmology;  Laterality: Left;  CDE 8.60  CHOLECYSTECTOMY    3/18  COLON SURGERY    INSERT / REPLACE / REMOVE PACEMAKER    JOINT REPLACEMENT    bilateral TKA  LOWER EXTREMITY ANGIOGRAPHY Left  10/14/2020  Procedure: LOWER EXTREMITY ANGIOGRAPHY;  Surgeon: Katha Cabal, MD;  Location: [facility];  Service: Cardiovascular;  Laterality: Left;  LOWER EXTREMITY ANGIOGRAPHY Right 09/08/2021  Procedure: LOWER EXTREMITY ANGIOGRAPHY;  Surgeon: Katha Cabal, MD;  Location: [facility];  Service: Cardiovascular;  Laterality: Right;  PACEMAKER INSERTION N/A 12/24/2015  Procedure: INSERTION PACEMAKER;  Surgeon: Isaias Cowman, MD;  Location: [facility];  Service: Cardiovascular;  Laterality: N/A;  ROTATOR CUFF REPAIR    bilateral  VIDEO BRONCHOSCOPY WITH ENDOBRONCHIAL NAVIGATION Right 11/11/2021  Procedure: VIDEO BRONCHOSCOPY WITH ENDOBRONCHIAL NAVIGATION;  Surgeon: Ottie Glazier, MD;  Location: [facility];  Service: Thoracic;  Laterality: Right; HPI: Per admitting H&P "LILIANN FILE is a 88 y.o. 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  Son at bedside and contributes to history.  Patient unable to arouse to answer questions  ED course and data review: Vitals within normal limits  on arrival.  ABG on 4 L shows pH 7.27 with PCO2 of 50 and PO2 of 60.  CBC unremarkable and lactic acid 1.5.  CMP significant for creatinine of 1.5 above baseline of 1.18.  Troponin 62.  COVID-negative.  EKG, personally viewed and interpreted shows sinus rhythm at 64 with no acute ST-T wave changes.  Chest x-ray shows the following:     Multifocal patchy opacities, progressive when compared to prior.  While some of this may reflect tumor in the right upper lobe,  superimposed multifocal pneumonia is suspected."  Subjective: pt confused, continuous verbalizations, unable to follow  directions, very distracted  Recommendations for follow up therapy are one component of a multi-disciplinary discharge planning process, led by the attending physician.  Recommendations may be updated based on patient status, additional functional criteria and insurance authorization. Assessment / Plan / Recommendation   01/27/2022   4:00 PM Clinical Impressions Clinical Impression Pt presents with altered mentation making it difficult for pt to follow any directions or sustain attention to tasks. Pt was not appropriately responsive to spoon at her lips and allowed spoonful of thin liquids to dribble out of her mouth. A cup was placed in pt's hand to promote more functional self-feeding response. With maximal assistnace, pt able to bring cup to her mouth for a sip. Pt presents with discoordinated timing of apnea prior to swallow initiation that ultimately results in pt desating to 73% on 5L via [place]. Pt's nurse arrived and advanced pt to 7 L via [place]. After ~ 10 minutes, pt's O2 increased but continued to be sub-optimal. This Probation officer paged pt's attending (Dr Shawna Clamp) who set parameter of 88% O2 on 7L before proceeding with any POs. Unfortunately, pt was at 82% on 7L [place]. At this time, pt has increased risk of respiratory compromise d/t inability sustain safe O2 during consumption. This appears directly related to discoordinated apnea and overall mentation. It is also concerning that most recent chest x-ray noted cardiomegaly with suspected trace pleural effusions. Suspect  superimposed mild interstitial edema on underlying chronic lung disease and heterogeneous bilateral airspace opacities appear slightly worsened in the right upper lobe and right base and may reflect atelectasis or pneumonia. At this time recommend NPO status with reconsult to ST services should pt's oxygen requirements decrease. SLP Visit Diagnosis Dysphagia, oropharyngeal phase (R13.12) Impact on safety and function Severe aspiration risk;Risk for  inadequate nutrition/hydration     01/27/2022   4:00 PM Treatment Recommendations Treatment Recommendations No treatment recommended at this time     01/27/2022   4:00 PM Prognosis Prognosis for Safe Diet Advancement -- Barriers to Reach Goals Severity of deficits;Cognitive deficits;Time post onset;Behavior   01/27/2022   4:00 PM Diet Recommendations SLP Diet Recommendations NPO Medication Administration Via alternative means     01/27/2022   4:00 PM Other Recommendations Oral Care Recommendations Oral care QID Follow Up Recommendations --   01/23/2022  10:00 AM Frequency and Duration  Speech Therapy Frequency (ACUTE ONLY) min 5x/week Treatment Duration 1 week      No data to display        No data to display        No data to display    Happi Rutherford Nail 01/28/2022, 7:49 AM                     DG Chest Port 1 View  Result Date: 01/26/2022 CLINICAL DATA:  Shortness of breath EXAM: PORTABLE CHEST 1 VIEW COMPARISON:  01/19/2022,  CT 12/18/2021, radiograph 11/05/2021 FINDINGS: Removal of the endotracheal tube. Left-sided pacing device as before. Bilateral shoulder replacements. Cardiomegaly with trace bilateral effusions. Underlying emphysema and chronic lung disease. Increased airspace disease in the right upper lobe and bilateral bases. Right upper extremity central venous catheter tip over the cavoatrial region. IMPRESSION: 1. Cardiomegaly with suspected trace pleural effusions. Suspect superimposed mild interstitial edema on underlying chronic lung disease 2. Heterogeneous bilateral airspace opacities appear slightly worsened in the right upper lobe and right base and may reflect atelectasis or pneumonia. Electronically Signed   By: Donavan Foil M.D.   On: 01/26/2022 01:15   CT HEAD W & WO CONTRAST (5MM)  Result Date: 01/20/2022 CLINICAL DATA:  Mental status change of unknown cause EXAM: CT HEAD WITHOUT AND WITH CONTRAST TECHNIQUE: Contiguous axial images were obtained from the base of the skull through the vertex without  and with intravenous contrast. RADIATION DOSE REDUCTION: This exam was performed according to the departmental dose-optimization program which includes automated exposure control, adjustment of the mA and/or kV according to patient size and/or use of iterative reconstruction technique. CONTRAST:  130mL OMNIPAQUE IOHEXOL 300 MG/ML  SOLN COMPARISON:  01/18/2022 FINDINGS: Brain: No change. Generalized age related atrophy. No sign of acute infarction, mass lesion, hemorrhage, hydrocephalus or extra-axial collection. After contrast administration, no abnormal enhancement occurs. Vascular: There is atherosclerotic calcification of the major vessels at the base of the brain. Skull: Negative Sinuses/Orbits: Clear/normal Other: None IMPRESSION: No change. Age related volume loss. No sign of acute infarction. No abnormal enhancement. Electronically Signed   By: Nelson Chimes M.D.   On: 01/20/2022 09:57   VAS [name] LOWER EXTREMITY ARTERIAL DUPLEX  Result Date: 01/20/2022 LOWER EXTREMITY ARTERIAL DUPLEX STUDY Patient Name:  Kristin [name]  Date of Exam:   01/15/2022 Medical Rec #: [id number]      Accession #:    [id number] Date of Birth: 1930-04-24       Patient Gender: F Patient Age:   63 years Exam Location:  [facility] Procedure:      VAS [name] LOWER EXTREMITY ARTERIAL DUPLEX Referring Phys: Eulogio Ditch --------------------------------------------------------------------------------  Indications: Peripheral artery disease.  Vascular Interventions: 10/14/20: Left EIA sten with left SFA/popliteal/TP                         trunk/PTA angioplasties;                         09/08/21: Right SFA/popliteal thrombectomy/PTA;Marland Kitchen Current ABI:            Rt 1.10,Lt 1.25 Comparison Study: 10/15/2021 Performing Technologist: Almira Coaster RVS  Examination Guidelines: A complete evaluation includes B-mode imaging, spectral Doppler, color Doppler, and power Doppler as needed of all accessible portions of each vessel. Bilateral testing  is considered an integral part of a complete examination. Limited examinations for reoccurring indications may be performed as noted.  +-----------+--------+-----+--------+---------+--------+ RIGHT      PSV cm/sRatioStenosisWaveform Comments +-----------+--------+-----+--------+---------+--------+ CFA Distal 149                  biphasic          +-----------+--------+-----+--------+---------+--------+ DFA        51                   biphasic          +-----------+--------+-----+--------+---------+--------+ SFA Prox   134  triphasic         +-----------+--------+-----+--------+---------+--------+ SFA Mid    66                   biphasic          +-----------+--------+-----+--------+---------+--------+ SFA Distal 47                   biphasic          +-----------+--------+-----+--------+---------+--------+ POP Distal 69                   biphasic          +-----------+--------+-----+--------+---------+--------+ ATA Distal 78                   biphasic          +-----------+--------+-----+--------+---------+--------+ PTA Distal 37                   biphasic          +-----------+--------+-----+--------+---------+--------+ PERO Distal65                   triphasic         +-----------+--------+-----+--------+---------+--------+  +-----------+--------+-----+--------+----------+--------+ LEFT       PSV cm/sRatioStenosisWaveform  Comments +-----------+--------+-----+--------+----------+--------+ CFA Distal 106                  triphasic          +-----------+--------+-----+--------+----------+--------+ DFA        59                   biphasic           +-----------+--------+-----+--------+----------+--------+ SFA Prox   86                   triphasic          +-----------+--------+-----+--------+----------+--------+ SFA Mid    111                  triphasic           +-----------+--------+-----+--------+----------+--------+ SFA Distal 73                   triphasic          +-----------+--------+-----+--------+----------+--------+ POP Distal 41                   triphasic          +-----------+--------+-----+--------+----------+--------+ ATA Distal 22                   monophasic         +-----------+--------+-----+--------+----------+--------+ PTA Distal 238                  triphasic          +-----------+--------+-----+--------+----------+--------+ PERO Distal13                   monophasic         +-----------+--------+-----+--------+----------+--------+  Summary: Right: Imaging and Waveforms obtained throughout in the Right Lower Extremity. Left: Imaging and Waveforms obtained throughout in the Left Lower Extremity.  See table(s) above for measurements and observations. Electronically signed by Leotis Pain MD on 01/20/2022 at 9:18:39 AM.    Final    VAS [name] ABI WITH/WO TBI  Result Date: 01/20/2022  LOWER EXTREMITY DOPPLER STUDY Patient Name:  TOMEKO SCOVILLE  Date of Exam:   01/15/2022 Medical Rec #: [id number]      Accession #:  [id number] Date of Birth: May 31, 1930       Patient Gender: F Patient Age:   89 years Exam Location:  [facility] Procedure:      VAS [name] ABI WITH/WO TBI Referring Phys: Leotis Pain --------------------------------------------------------------------------------  Indications: Peripheral artery disease.  Vascular Interventions: 10/14/2020: PTA of the Left SFA and Popliteal Artery.                         PTA of the Left Posterior tibial Artery and                         Tibioperoneal trunk. PTA and Stent placement Left EIA. Performing Technologist: Almira Coaster RVS  Examination Guidelines: A complete evaluation includes at minimum, Doppler waveform signals and systolic blood pressure reading at the level of bilateral brachial, anterior tibial, and posterior tibial arteries, when vessel segments are  accessible. Bilateral testing is considered an integral part of a complete examination. Photoelectric Plethysmograph (PPG) waveforms and toe systolic pressure readings are included as required and additional duplex testing as needed. Limited examinations for reoccurring indications may be performed as noted.  ABI Findings: +---------+------------------+-----+--------+--------+ Right    Rt Pressure (mmHg)IndexWaveformComment  +---------+------------------+-----+--------+--------+ Brachial 111                                     +---------+------------------+-----+--------+--------+ ATA      107               0.96 biphasic         +---------+------------------+-----+--------+--------+ PTA      122               1.10 biphasic         +---------+------------------+-----+--------+--------+ Great Toe86                0.77 Normal           +---------+------------------+-----+--------+--------+ +---------+------------------+-----+--------+-------+ Left     Lt Pressure (mmHg)IndexWaveformComment +---------+------------------+-----+--------+-------+ Brachial 104                                    +---------+------------------+-----+--------+-------+ ATA      131               1.18 biphasic        +---------+------------------+-----+--------+-------+ PTA      139               1.25 biphasic        +---------+------------------+-----+--------+-------+ Great Toe85                0.77 Normal          +---------+------------------+-----+--------+-------+ +-------+-----------+-----------+------------+------------+ ABI/TBIToday's ABIToday's TBIPrevious ABIPrevious TBI +-------+-----------+-----------+------------+------------+ Right  1.10       .77        1.39        1.15         +-------+-----------+-----------+------------+------------+ Left   1.25       .77        1.17        .96          +-------+-----------+-----------+------------+------------+   Bilateral ABIs appear essentially unchanged compared to prior study on 09/2021.  Summary: Right: Resting right ankle-brachial index is within normal range. No evidence of significant right lower extremity arterial  disease. The right toe-brachial index is normal. Left: Resting left ankle-brachial index is within normal range. No evidence of significant left lower extremity arterial disease. The left toe-brachial index is normal.  *See table(s) above for measurements and observations.  Electronically signed by Leotis Pain MD on 01/20/2022 at 9:18:21 AM.    Final    DG Chest Port 1 View  Result Date: 01/19/2022 CLINICAL DATA:  Acute respiratory failure with hypoxia EXAM: PORTABLE CHEST 1 VIEW COMPARISON:  01/17/2022 chest radiograph. FINDINGS: Left rotated chest radiograph. Endotracheal tube tip is 2.3 cm above the carina. Right PICC terminates over the cavoatrial junction. Stable configuration of 2 lead left subclavian pacemaker. Partially visualized bilateral shoulder arthroplasty. Stable cardiomediastinal silhouette with mild cardiomegaly. No pneumothorax. No pleural effusion. Mild-to-moderate diffuse prominence of the parahilar interstitial markings, similar. IMPRESSION: 1. Well-positioned support structures. 2. Stable mild cardiomegaly and mild-to-moderate diffuse prominence of the parahilar interstitial markings, suggesting congestive heart failure. Electronically Signed   By: Ilona Sorrel M.D.   On: 01/19/2022 08:06   EEG adult  Result Date: 01/18/2022 Lora Havens, MD     01/18/2022 12:55 PM Patient Name: JAILEN LUNG MRN: [id number] Epilepsy Attending: Lora Havens Referring Physician/Provider: Rust-Chester, Huel Cote, NP Date: 01/18/2022 Duration: 23.24 mins Patient history: 88 year old female with seizure-like activity described as shaking in both upper extremities.  EEG to evaluate for seizure. Level of alertness:  lethargic AEDs during EEG study: Keppra, Versed Technical aspects: This EEG study  was done with scalp electrodes positioned according to the 10-20 International system of electrode placement. Electrical activity was acquired at a sampling rate of 500Hz  and reviewed with a high frequency filter of 70Hz  and a low frequency filter of 1Hz . EEG data were recorded continuously and digitally stored. Description: EEG showed continuous generalized 3-6 Esti And delta slowing, at times with triphasic morphology.  Hyperventilation and photic stimulation were not performed.   ABNORMALITY - Continuous slow, generalized IMPRESSION: This study is suggestive of moderate to severe diffuse encephalopathy, nonspecific etiology. No seizures or definite epileptiform discharges were seen throughout the recording. [place]   CT HEAD WO CONTRAST (5MM)  Result Date: 01/18/2022 CLINICAL DATA:  Seizure, new onset, no history of trauma. EXAM: CT HEAD WITHOUT CONTRAST TECHNIQUE: Contiguous axial images were obtained from the base of the skull through the vertex without intravenous contrast. RADIATION DOSE REDUCTION: This exam was performed according to the departmental dose-optimization program which includes automated exposure control, adjustment of the mA and/or kV according to patient size and/or use of iterative reconstruction technique. COMPARISON:  Yesterday FINDINGS: Brain: No evidence of acute infarction, hemorrhage, hydrocephalus, extra-axial collection or mass lesion/mass effect. Age congruent cerebral volume loss. Less than typical white matter low-density. Vascular: No hyperdense vessel or unexpected calcification. Skull: No acute finding.  Hyperostosis interna. Sinuses/Orbits: No acute finding.  Bilateral cataract resection. IMPRESSION: No acute or interval finding. Electronically Signed   By: Jorje Guild M.D.   On: 01/18/2022 06:33   US Venous Img Lower Bilateral (DVT)  Result Date: 01/17/2022 CLINICAL DATA:  History of deep venous thrombosis, history of lung and breast cancer EXAM: BILATERAL  LOWER EXTREMITY VENOUS DOPPLER ULTRASOUND TECHNIQUE: Gray-scale sonography with compression, as well as color and duplex ultrasound, were performed to evaluate the deep venous system(s) from the level of the common femoral vein through the popliteal and proximal calf veins. COMPARISON:  09/18/2021 FINDINGS: VENOUS Normal compressibility of the common femoral, superficial femoral, and popliteal veins, as well as the visualized calf  veins. Visualized portions of profunda femoral vein and great saphenous vein unremarkable. No filling defects to suggest DVT on grayscale or color Doppler imaging. Doppler waveforms show normal direction of venous flow, normal respiratory plasticity and response to augmentation. OTHER None. Limitations: none IMPRESSION: 1. No evidence of deep venous thrombosis within either lower extremity. Electronically Signed   By: Randa Ngo M.D.   On: 01/17/2022 20:06   ECHOCARDIOGRAM COMPLETE  Result Date: 01/17/2022    ECHOCARDIOGRAM REPORT   Patient Name:   Kristin [name] Date of Exam: 01/17/2022 Medical Rec #:  [id number]     Height:       67.0 in Accession #:    [id number]    Weight:       167.1 lb Date of Birth:  08-31-1929      BSA:          1.874 m Patient Age:    27 years      BP:           108/84 mmHg Patient Gender: F             HR:           75 bpm. Exam Location:  [facility] Procedure: 2D Echo Indications:     Cardiomyopathy- Unspecified I42.9  History:         Patient has prior history of Echocardiogram examinations, most                  recent 05/24/2020.  Sonographer:     Kathlen Brunswick RDCS Referring Phys:  [id number] BRITTON L RUST-CHESTER Diagnosing Phys: Donnelly Angelica  Sonographer Comments: Echo performed with patient supine and on artificial respirator. Image acquisition challenging due to respiratory motion. IMPRESSIONS  1. Left ventricular ejection fraction, by estimation, is 50 to 55%. The left ventricle has low normal function. The left ventricle has no regional wall motion  abnormalities. There is mild left ventricular hypertrophy. Left ventricular diastolic parameters are consistent with Grade I diastolic dysfunction (impaired relaxation). There is the interventricular septum is flattened in diastole ('D' shaped left ventricle), consistent with right ventricular volume overload.  2. Right ventricular systolic function is moderately reduced. The right ventricular size is severely enlarged.  3. Right atrial size was moderately dilated.  4. The mitral valve is normal in structure. Mild mitral valve regurgitation.  5. Tricuspid valve regurgitation is moderate.  6. The aortic valve is grossly normal. Aortic valve regurgitation is trivial. Aortic valve sclerosis is present, with no evidence of aortic valve stenosis.  7. Aortic dilatation noted. There is mild dilatation of the aortic root, measuring 39 mm. Conclusion(s)/Recommendation(s): SEVERE DILATED, HYPOCONTRACTILE RV. FINDINGS  Left Ventricle: Left ventricular ejection fraction, by estimation, is 50 to 55%. The left ventricle has low normal function. The left ventricle has no regional wall motion abnormalities. The left ventricular internal cavity size was normal in size. There is mild left ventricular hypertrophy. Abnormal (paradoxical) septal motion, consistent with left bundle branch block and the interventricular septum is flattened in diastole ('D' shaped left ventricle), consistent with right ventricular volume overload. Left ventricular diastolic parameters are consistent with Grade I diastolic dysfunction (impaired relaxation). Right Ventricle: The right ventricular size is severely enlarged. Right vetricular wall thickness was not well visualized. Right ventricular systolic function is moderately reduced. Left Atrium: Left atrial size was normal in size. Right Atrium: Right atrial size was moderately dilated. Pericardium: Trivial pericardial effusion is present. Mitral Valve: The mitral valve is normal in structure. Mild  mitral valve regurgitation. Tricuspid Valve: The tricuspid valve is normal in structure. Tricuspid valve regurgitation is moderate. Aortic Valve: The aortic valve is grossly normal. Aortic valve regurgitation is trivial. Aortic valve sclerosis is present, with no evidence of aortic valve stenosis. Aortic valve peak gradient measures 13.2 mmHg. Pulmonic Valve: The pulmonic valve was not well visualized. Pulmonic valve regurgitation is mild to moderate. Aorta: Aortic dilatation noted. There is mild dilatation of the aortic root, measuring 39 mm. Venous: IVC assessment for right atrial pressure unable to be performed due to mechanical ventilation. IAS/Shunts: No atrial level shunt detected by color flow Doppler.  LEFT VENTRICLE PLAX 2D LVIDd:         3.75 cm     Diastology LVIDs:         2.70 cm     LV e' medial:    3.81 cm/s LV PW:         1.40 cm     LV E/e' medial:  14.4 LV IVS:        1.12 cm     LV e' lateral:   7.94 cm/s LVOT diam:     2.00 cm     LV E/e' lateral: 6.9 LV SV:         47 LV SV Index:   25 LVOT Area:     3.14 cm  LV Volumes (MOD) LV vol d, MOD A4C: 52.9 ml LV vol s, MOD A4C: 23.3 ml LV SV MOD A4C:     52.9 ml RIGHT VENTRICLE RV Basal diam:  5.98 cm RV S prime:     11.90 cm/s TAPSE (M-mode): 2.2 cm LEFT ATRIUM             Index        RIGHT ATRIUM           Index LA diam:        4.20 cm 2.24 cm/m   RA Area:     31.00 cm LA Vol (A2C):   37.4 ml 19.96 ml/m  RA Volume:   128.00 ml 68.32 ml/m LA Vol (A4C):   43.7 ml 23.32 ml/m LA Biplane Vol: 44.6 ml 23.80 ml/m  AORTIC VALVE                 PULMONIC VALVE AV Area (Vmax): 1.68 cm     PV Vmax:          1.25 m/s AV Vmax:        182.00 cm/s  PV Peak grad:     6.2 mmHg AV Peak Grad:   13.2 mmHg    PR End Diast Vel: 8.64 msec LVOT Vmax:      97.50 cm/s LVOT Vmean:     57.500 cm/s LVOT VTI:       0.150 m  AORTA Ao Root diam: 3.90 cm Ao Asc diam:  2.70 cm MITRAL VALVE               TRICUSPID VALVE MV Area (PHT): 2.66 cm    TV Peak grad:   27.1 mmHg MV  Decel Time: 285 msec    TV Vmax:        2.60 m/s MV E velocity: 54.80 cm/s MV A velocity: 96.40 cm/s  SHUNTS MV E/A ratio:  0.57        Systemic VTI:  0.15 m  Systemic Diam: 2.00 cm Donnelly Angelica Electronically signed by Donnelly Angelica Signature Date/Time: 01/17/2022/5:20:42 PM    Final    [name] EKG SITE RITE  Result Date: 01/17/2022 If Site Rite image not attached, placement could not be confirmed due to current cardiac rhythm.  DG Chest Port 1 View  Result Date: 01/17/2022 CLINICAL DATA:  Status post intubation. EXAM: PORTABLE CHEST 1 VIEW COMPARISON:  01/16/2022 FINDINGS: 0448 hours. Endotracheal tube tip is 2.6 cm above the base of the carina. Catheter tubing overlying the hypopharynx may be an NG tube coiled in the mouth and hypopharynx. The cardio pericardial silhouette is enlarged. Similar appearance diffuse interstitial opacity with patchy asymmetric airspace disease. No substantial pleural effusion. Bilateral shoulder replacement with left permanent pacemaker noted. IMPRESSION: 1. Catheter tubing overlying the hypopharynx may reflect NG tube coiled in the mouth and hypopharynx. 2. Endotracheal tube tip 2.6 cm above the base of the carina. 3. Stable diffuse interstitial and patchy asymmetric bilateral airspace disease. These results will be called to the ordering clinician or representative by the Radiologist Assistant, and communication documented in the PACS or [employer]. Electronically Signed   By: Misty Stanley M.D.   On: 01/17/2022 05:02   CT HEAD WO CONTRAST (5MM)  Result Date: 01/17/2022 CLINICAL DATA:  Mental status change with unknown cause EXAM: CT HEAD WITHOUT CONTRAST TECHNIQUE: Contiguous axial images were obtained from the base of the skull through the vertex without intravenous contrast. RADIATION DOSE REDUCTION: This exam was performed according to the departmental dose-optimization program which includes automated exposure control, adjustment of the mA  and/or kV according to patient size and/or use of iterative reconstruction technique. COMPARISON:  12/18/2021 FINDINGS: Brain: No evidence of acute infarction, hemorrhage, hydrocephalus, extra-axial collection or mass lesion/mass effect. Age expected brain volume. Mild chronic small vessel ischemic change in the hemispheric white matter Vascular: No hyperdense vessel or unexpected calcification. Skull: Retro dental ligamentous thickening contacting the cervicomedullary junction without high-grade foramen magnum stenosis. Sinuses/Orbits: No acute finding.  Bilateral cataract resection IMPRESSION: No acute finding.  Unremarkable appearance of the brain for age Electronically Signed   By: Jorje Guild M.D.   On: 01/17/2022 04:16   DG Chest Port 1 View  Result Date: 01/16/2022 CLINICAL DATA:  Weakness, shortness of breath EXAM: PORTABLE CHEST 1 VIEW COMPARISON:  CTA chest dated 12/18/2021 FINDINGS: Multifocal patchy opacities, progressive when compared to the prior. The patient has known right upper lobe tumor and multifocal scarring. Given progression, superimposed pneumonia is not excluded, although some degree of progressive tumor could account for the progression in the right upper lobe. No pleural effusion or pneumothorax. Cardiomegaly. Left subclavian pacemaker. Thoracic aortic atherosclerosis. Bilateral shoulder arthroplasties. IMPRESSION: Multifocal patchy opacities, progressive when compared to prior. While some of this may reflect tumor in the right upper lobe, superimposed multifocal pneumonia is suspected. Electronically Signed   By: Julian Hy M.D.   On: 01/16/2022 23:40      Subjective: Patient was seen and examined at bedside.  Overnight events noted. Patient appears chronically ill looking, deconditioned, but remains comfortable on 5 L of oxygen.  She reports pain is manageable.  Discharge Exam: Vitals:   01/30/22 2045 01/31/22 0744  BP:  (!) 107/52  Pulse: 64 63  Resp: 19 16   Temp:  98.4 F (36.9 C)  SpO2: 93% 90%   Vitals:   01/30/22 2000 01/30/22 2045 01/31/22 0500 01/31/22 0744  BP: (!) 99/52   (!) 107/52  Pulse: 61 64  63  Resp: (!) 22 19  16   Temp: 99.9 F (37.7 C)   98.4 F (36.9 C)  TempSrc:      SpO2: 98% 93%  90%  Weight:   69.4 kg   Height:        General: Appears chronically ill looking, deconditioned, not in any distress. Cardiovascular: RRR, S1/S2 +, no rubs, no gallops Respiratory: CTA bilaterally, no wheezing, no rhonchi Abdominal: Soft, NT, ND, bowel sounds + Extremities: no edema, no cyanosis    The results of significant diagnostics from this hospitalization (including imaging, microbiology, ancillary and laboratory) are listed below for reference.     Microbiology: No results found for this or any previous visit (from the past 240 hour(s)).   Labs: BNP (last 3 results) Recent Labs    12/17/21 2113 12/19/21 0542 01/17/22 0549  BNP 1,454.7* 2,011.3* 2,620.3*   Basic Metabolic Panel: Recent Labs  Lab 01/25/22 0739 01/27/22 0452 01/28/22 0500 01/29/22 0509 01/29/22 0930  NA 139 139 137 138  --   K 4.0 4.6 4.7 5.4* 5.1  CL 112* 111 105 104  --   CO2 23 25 28 28   --   GLUCOSE 111* 132* 121* 133*  --   BUN 23 32* 39* 50*  --   CREATININE 0.64 0.74 0.75 1.25*  --   CALCIUM 9.6 9.5 9.7 9.5  --   MG 2.0  --  2.1 2.1  --   PHOS 3.0  --  4.3 4.2  --    Liver Function Tests: Recent Labs  Lab 01/25/22 0739 01/28/22 0500  AST 16 19  ALT 12 9  ALKPHOS 59 58  BILITOT 0.2* 0.5  PROT 5.8* 6.5  ALBUMIN 2.2* 2.4*   No results for input(s): "LIPASE", "AMYLASE" in the last 168 hours. No results for input(s): "AMMONIA" in the last 168 hours. CBC: Recent Labs  Lab 01/25/22 0739 01/26/22 0608 01/27/22 0452 01/28/22 0500  WBC 5.5 6.2 6.8 7.6  NEUTROABS 3.5 4.1 4.6  --   HGB 9.4* 9.3* 9.5* 9.5*  HCT 30.3* 30.0* 30.2* 30.4*  MCV 86.1 85.7 86.0 85.4  PLT 154 162 182 204   Cardiac Enzymes: No results for  input(s): "CKTOTAL", "CKMB", "CKMBINDEX", "TROPONINI" in the last 168 hours. BNP: Invalid input(s): "POCBNP" CBG: Recent Labs  Lab 01/30/22 2003 01/30/22 2324 01/31/22 0354 01/31/22 0740 01/31/22 0826  GLUCAP 84 73 73 67* 98   D-Dimer No results for input(s): "DDIMER" in the last 72 hours. Hgb A1c No results for input(s): "HGBA1C" in the last 72 hours. Lipid Profile No results for input(s): "CHOL", "HDL", "LDLCALC", "TRIG", "CHOLHDL", "LDLDIRECT" in the last 72 hours. Thyroid function studies No results for input(s): "TSH", "T4TOTAL", "T3FREE", "THYROIDAB" in the last 72 hours.  Invalid input(s): "FREET3" Anemia work up No results for input(s): "VITAMINB12", "FOLATE", "FERRITIN", "TIBC", "IRON", "RETICCTPCT" in the last 72 hours. Urinalysis    Component Value Date/Time   COLORURINE YELLOW (A) 01/27/2022 1235   APPEARANCEUR TURBID (A) 01/27/2022 1235   APPEARANCEUR Clear 09/03/2015 1036   LABSPEC 1.016 01/27/2022 1235   PHURINE 5.0 01/27/2022 1235   GLUCOSEU 50 (A) 01/27/2022 1235   HGBUR LARGE (A) 01/27/2022 1235   BILIRUBINUR NEGATIVE 01/27/2022 1235   BILIRUBINUR Negative 04/02/2021 0908   BILIRUBINUR Negative 09/03/2015 1036   KETONESUR NEGATIVE 01/27/2022 1235   PROTEINUR 30 (A) 01/27/2022 1235   UROBILINOGEN 0.2 04/02/2021 0908   UROBILINOGEN 0.2 02/17/2011 1050   NITRITE NEGATIVE 01/27/2022 1235   LEUKOCYTESUR LARGE (A)  01/27/2022 1235   Sepsis Labs Recent Labs  Lab 01/25/22 0739 01/26/22 0608 01/27/22 0452 01/28/22 0500  WBC 5.5 6.2 6.8 7.6   Microbiology No results found for this or any previous visit (from the past 240 hour(s)).   Time coordinating discharge: Over 30 minutes  SIGNED:   Shawna Clamp, MD  [facility] 01/31/2022, 9:24 AM Pager   If 7PM-7AM, please contact night-coverage

## 2022-01-31 NOTE — Plan of Care (Signed)
  Problem: Pain Managment: Goal: General experience of comfort will improve Outcome: Progressing   Problem: Pain Managment: Goal: General experience of comfort will improve Outcome: Progressing

## 2022-01-31 NOTE — Progress Notes (Signed)
[facility]) Hospital Liaison Note  Hospice Home is able to offer a bed for Ms. Knoblock today.  Please send signed and completed DNR with patient.  Bedside RN please call report to [phone].  Please call should you have any questions.  Thank you, Margaretmary Eddy, BSN, RN [facility] Liaison  [phone]

## 2022-02-01 ENCOUNTER — Telehealth: Payer: Medicare HMO

## 2022-02-01 ENCOUNTER — Ambulatory Visit: Payer: Self-pay

## 2022-02-01 DIAGNOSIS — J189 Pneumonia, unspecified organism: Secondary | ICD-10-CM

## 2022-02-01 DIAGNOSIS — I5032 Chronic diastolic (congestive) heart failure: Secondary | ICD-10-CM

## 2022-02-01 DIAGNOSIS — J432 Centrilobular emphysema: Secondary | ICD-10-CM

## 2022-02-01 DIAGNOSIS — A419 Sepsis, unspecified organism: Secondary | ICD-10-CM

## 2022-02-01 DIAGNOSIS — R296 Repeated falls: Secondary | ICD-10-CM

## 2022-02-01 NOTE — Chronic Care Management (AMB) (Signed)
   02/01/2022  Kristin [name] 10-21-1929 [id number]   Spoke to the patient's granddaughter, Marcellina Millin. The patient is now at [facility] after hospital admission from 01-16-2022 to 01-31-2022. The patient is comfort care and no longer meets criteria for CCM services.  RNCM has closed care plans of the [facility] and LCSW after collaboration with Christa See, LCSW. Empathetic listening and support given to the patients granddaughter.   No further outreach at this time.  Noreene Larsson RN, MSN, [place] Mobile: [phone]

## 2022-02-01 NOTE — Patient Instructions (Signed)
Visit Information  Thank you for taking time to visit with me today. Please don't hesitate to contact me if I can be of assistance to you before our next scheduled telephone appointment.  Following are the goals we discussed today:  Patient is now a patient at the Hospice home with comfort care measures only. Care plans for LCSW and RNCM closed.   No further follow up required at this time  Please call the care guide team at [phone] if you need to cancel or reschedule your appointment.   If you are experiencing a Mental Health or [place] or need someone to talk to, please call the Suicide and Crisis Lifeline: 988 call the [place] National Suicide Prevention Lifeline: 423-499-9309 or TTY: 813-512-6421 TTY (564)180-0801) to talk to a trained counselor call 1-800-273-TALK (toll free, 24 hour hotline)   Patient verbalizes understanding of instructions and care plan provided today and agrees to view in [place]. Active MyChart status and patient understanding of how to access instructions and care plan via MyChart confirmed with patient.     No further follow up required: patient is a patient at [facility] with comfort care measures in place  [place], MSN, [facility] Mobile: [phone]

## 2022-02-02 ENCOUNTER — Telehealth: Payer: Medicare HMO

## 2022-02-03 ENCOUNTER — Other Ambulatory Visit: Payer: Self-pay | Admitting: Nurse Practitioner

## 2022-02-10 DIAGNOSIS — J45998 Other asthma: Secondary | ICD-10-CM | POA: Diagnosis not present

## 2022-02-15 ENCOUNTER — Ambulatory Visit: Payer: Medicare HMO | Admitting: Family

## 2022-02-16 NOTE — Progress Notes (Deleted)
[name], female    DOB: August 20, 1930, 88 y.o.   MRN: [id number]  HPI  Kristin [name] is a 88 y/o female with a history of atrial fibrillation, right breast cancer, chronic back pain, CKD, COPD, gout, hepatitis, hyperlipidemia, HTN, hypomagnesium and chronic heart failure  Echo report from 01/17/22 reviewed and showed an EF of 50-55% along with mild LVH, mild MR and moderate TR. Echo report from 05/24/20 reviewed and showed an EF of 60-65% along with mild LVH and moderate LAE. Reviewed echo report on 10/29/15 which showed an EF of 55-65% with moderate MR.   Admitted 01/16/22 due to lethargy and weakness. Patient decompensated and required mechanical ventilation for airway protection. Had seizure like activity while on ventilator. Neurology, palliative care and med oncology consults obtained. CT head and neck was negative for any brain mets. Placed on heparin drip due to AF RVR. Discharged after 15 days with home hospice. Admitted 12/17/21 due to AMS and sepsis. CT was negative for PE. Cardiology, medical oncology and wound consults obtained. Initially given IV lasix with transition to oral diuretics. Discharged after 2 days. Was in the ED 11/25/21 due to mechanical fall. Head CT was negative. IVF given for mild dehydration and she was released. Was in the ED 11/20/21 due to mechanical fall resulting in head laceration. Head CT negative. Laceration stapled and she was released.                                Patient presents today for a follow-up visit with a chief complaint of      Past Medical History:  Diagnosis Date   Acute postoperative pain 02/09/2018   Arrhythmia, sinus node 04/03/2014   Arthritis    Arthritis    Atrial fibrillation (HCC)    Back pain    lower back chronic   Bradycardia    Breast cancer ([place]) 2004   right breast cancer   Cancer ([place]) 2004   rt breast cancer-post lumpectomy- chemo/rad   CHF (congestive heart failure) (HCC)    Chronic back pain    CKD (chronic kidney  disease)    COPD (chronic obstructive pulmonary disease) ([place])    wears O2 at 2L via [address] at night   COVID-19 06/11/2020   Cystocele    Decubitus ulcers    Dehydration    Gout    Hematuria    Hepatitis C    Hyperlipidemia    Hypertension    Hypomagnesemia 06/25/2015   NSTEMI (non-ST elevated myocardial infarction) ([place]) 04/08/2021   OAB (overactive bladder)    Overactive bladder    Overdose opiate, accidental or unintentional, sequela 10/02/2020   Restless leg    Shoulder pain, bilateral    Urinary frequency    UTI (lower urinary tract infection)    Vaginal atrophy    Past Surgical History:  Procedure Laterality Date   ABDOMINAL HYSTERECTOMY     BACK SURGERY     BREAST LUMPECTOMY Right 2004   BREAST SURGERY     rt lumpectomy   CARPAL TUNNEL RELEASE     CATARACT EXTRACTION W/PHACO  10/19/2011   Procedure: CATARACT EXTRACTION PHACO AND INTRAOCULAR LENS PLACEMENT ([place]);  Surgeon: Elta Guadeloupe T. Gershon Crane, MD;  Location: [facility];  Service: Ophthalmology;  Laterality: Right;  CDE:10.81   CATARACT EXTRACTION W/PHACO  11/02/2011   Procedure: CATARACT EXTRACTION PHACO AND INTRAOCULAR LENS PLACEMENT ([facility]);  Surgeon: Elta Guadeloupe T. Gershon Crane, MD;  Location: [facility];  Service: Ophthalmology;  Laterality: Left;  CDE 8.60   CHOLECYSTECTOMY     3/18   COLON SURGERY     INSERT / REPLACE / REMOVE PACEMAKER     JOINT REPLACEMENT     bilateral TKA   LOWER EXTREMITY ANGIOGRAPHY Left 10/14/2020   Procedure: LOWER EXTREMITY ANGIOGRAPHY;  Surgeon: Katha Cabal, MD;  Location: [facility];  Service: Cardiovascular;  Laterality: Left;   LOWER EXTREMITY ANGIOGRAPHY Right 09/08/2021   Procedure: LOWER EXTREMITY ANGIOGRAPHY;  Surgeon: Katha Cabal, MD;  Location: [facility];  Service: Cardiovascular;  Laterality: Right;   PACEMAKER INSERTION N/A 12/24/2015   Procedure: INSERTION PACEMAKER;  Surgeon: Isaias Cowman, MD;  Location: [facility];  Service: Cardiovascular;  Laterality: N/A;    ROTATOR CUFF REPAIR     bilateral   VIDEO BRONCHOSCOPY WITH ENDOBRONCHIAL NAVIGATION Right 11/11/2021   Procedure: VIDEO BRONCHOSCOPY WITH ENDOBRONCHIAL NAVIGATION;  Surgeon: Ottie Glazier, MD;  Location: [facility];  Service: Thoracic;  Laterality: Right;   Family History  Problem Relation Age of Onset   Kidney disease Brother    Heart disease Father    Heart disease Mother    Stroke Mother    Cancer Sister    Breast cancer Sister 48   Breast cancer Paternal Aunt    Anesthesia problems Neg Hx    Hypotension Neg Hx    Malignant hyperthermia Neg Hx    Pseudochol deficiency Neg Hx    Social History   Tobacco Use   Smoking status: Former    Packs/day: 1.25    Years: 40.00    Total pack years: 50.00    Types: Cigarettes    Quit date: 12/16/1998    Years since quitting: 23.1   Smokeless tobacco: Never  Substance Use Topics   Alcohol use: No    Alcohol/week: 0.0 standard drinks of alcohol   Allergies  Allergen Reactions   Flexeril [Cyclobenzaprine Hcl]     Severe Rash   Vancomycin Rash    [place] Syndrome     Review of Systems  Constitutional:  Positive for fatigue. Negative for appetite change.  HENT:  Negative for congestion, postnasal drip and sore throat.   Eyes: Negative.   Respiratory:  Positive for cough (in the mornings), shortness of breath (improving) and wheezing. Negative for chest tightness.   Cardiovascular:  Positive for leg swelling (little bit better). Negative for chest pain and palpitations.  Gastrointestinal:  Negative for abdominal distention and abdominal pain.  Endocrine: Negative.   Genitourinary: Negative.   Musculoskeletal:  Positive for arthralgias (both shoulders) and back pain. Negative for neck stiffness.  Skin: Negative.   Allergic/Immunologic: Negative.   Neurological:  Negative for dizziness and light-headedness.  Hematological:  Negative for adenopathy. Bruises/bleeds easily.  Psychiatric/Behavioral:  Negative for dysphoric mood and  sleep disturbance (sleeping on 1 pillow; wearing oxygen @ 2L). The patient is not nervous/anxious.       Physical Exam Vitals and nursing note reviewed. Exam conducted with a chaperone present (son).  Constitutional:      Appearance: She is well-developed.  HENT:     Head: Normocephalic and atraumatic.  Neck:     Vascular: No JVD.  Cardiovascular:     Rate and Rhythm: Normal rate. Rhythm irregular.  Pulmonary:     Effort: Pulmonary effort is normal.     Breath sounds: Examination of the right-lower field reveals rhonchi. Examination of the left-lower field reveals rhonchi and rales. Rhonchi  and rales present. No wheezing.  Abdominal:     General: There is no distension.     Palpations: Abdomen is soft.     Tenderness: There is no abdominal tenderness.  Musculoskeletal:        General: No tenderness.     Cervical back: Normal range of motion and neck supple.     Right lower leg: No tenderness. Edema (2+ pitting) present.     Left lower leg: No tenderness. Edema (2+ pitting) present.  Skin:    General: Skin is warm and dry.  Neurological:     General: No focal deficit present.     Mental Status: She is alert and oriented to person, place, and time.  Psychiatric:        Mood and Affect: Mood normal.        Behavior: Behavior normal.        Thought Content: Thought content normal.    Assessment & Plan:  1: Chronic heart failure with preserved ejection fraction with functional changes (LVH/LAE)- - NYHA class III - euvolemic today - weighing daily; Reminded to call for an overnight weight gain of >2 pounds or a weekly weight gain of >5 pounds - weight down 2 pounds from last visit here 2 weeks ago - adding salt to her food on occasion and has been eating some saltier foods - Wearing oxygen at 2L around the clock although she didn't wear it into the office today but left it in the car - get compression socks and put them on every morning with removal at bedtime - BNP 12/19/21  was 2011.3  2: HTN-  - BP  - saw PCP Parks Ranger) 12/24/21 - BMP 12/19/21 reviewed and showed sodium 135, potassium 4.0, creatinine 1.18 and GFR 442  3: PAF- - Saw cardiology Margarito Courser) 10/05/21 - on apixaban  4: PVD- - saw vascular Owens Shark) 10/15/21  5: Lung Cancer- - palliative care visit done 12/22/21 - saw oncology Janese Banks) 11/17/21 - undergoing daily radiation   Medication list reviewed.   Return in 1 month, sooner if needed.

## 2022-02-17 ENCOUNTER — Ambulatory Visit: Payer: Medicare HMO | Admitting: Family

## 2022-02-20 IMAGING — CR DG CHEST 2V
2 series · 2 of 2 positions shown · non-contrast
Comparison: Chest radiograph 04/07/2021; CT chest 01/08/2021

CLINICAL DATA: Shortness of breath.

EXAM:
CHEST - 2 VIEW

[chest pa]
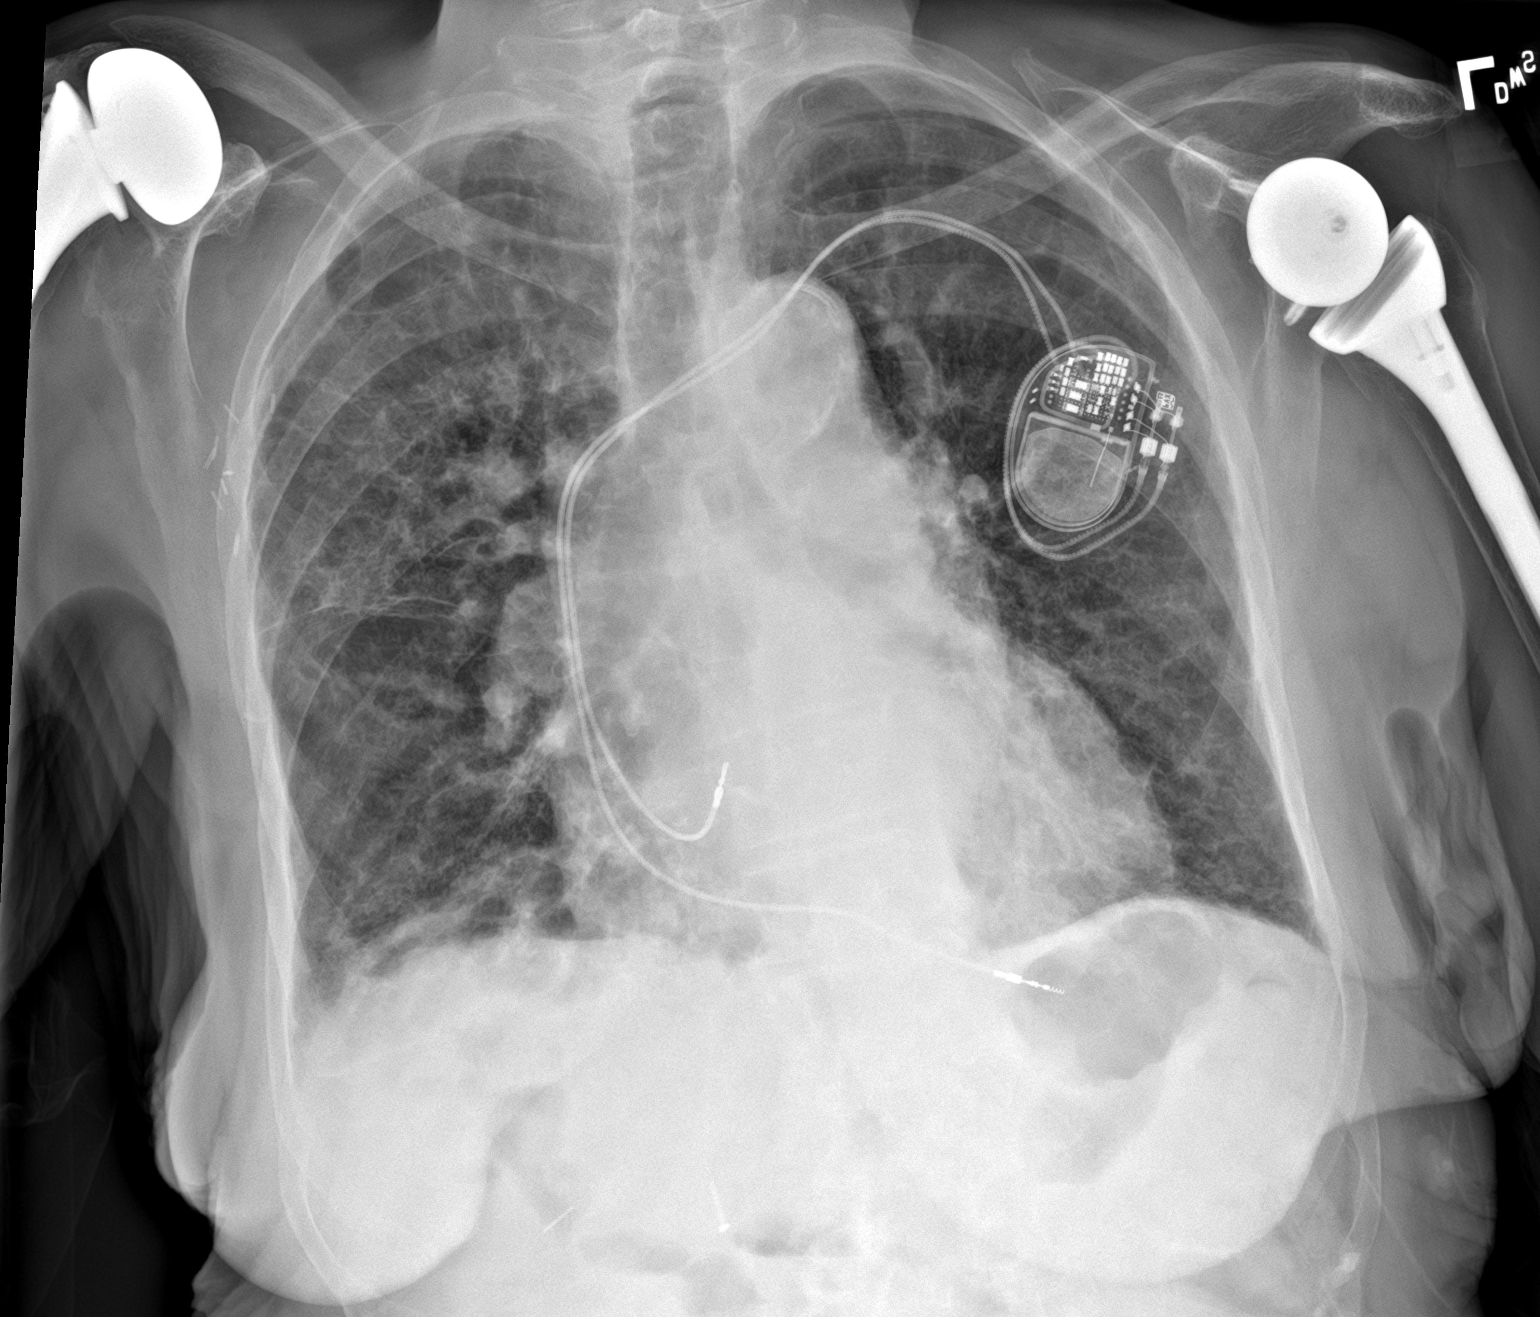

[chest lat]
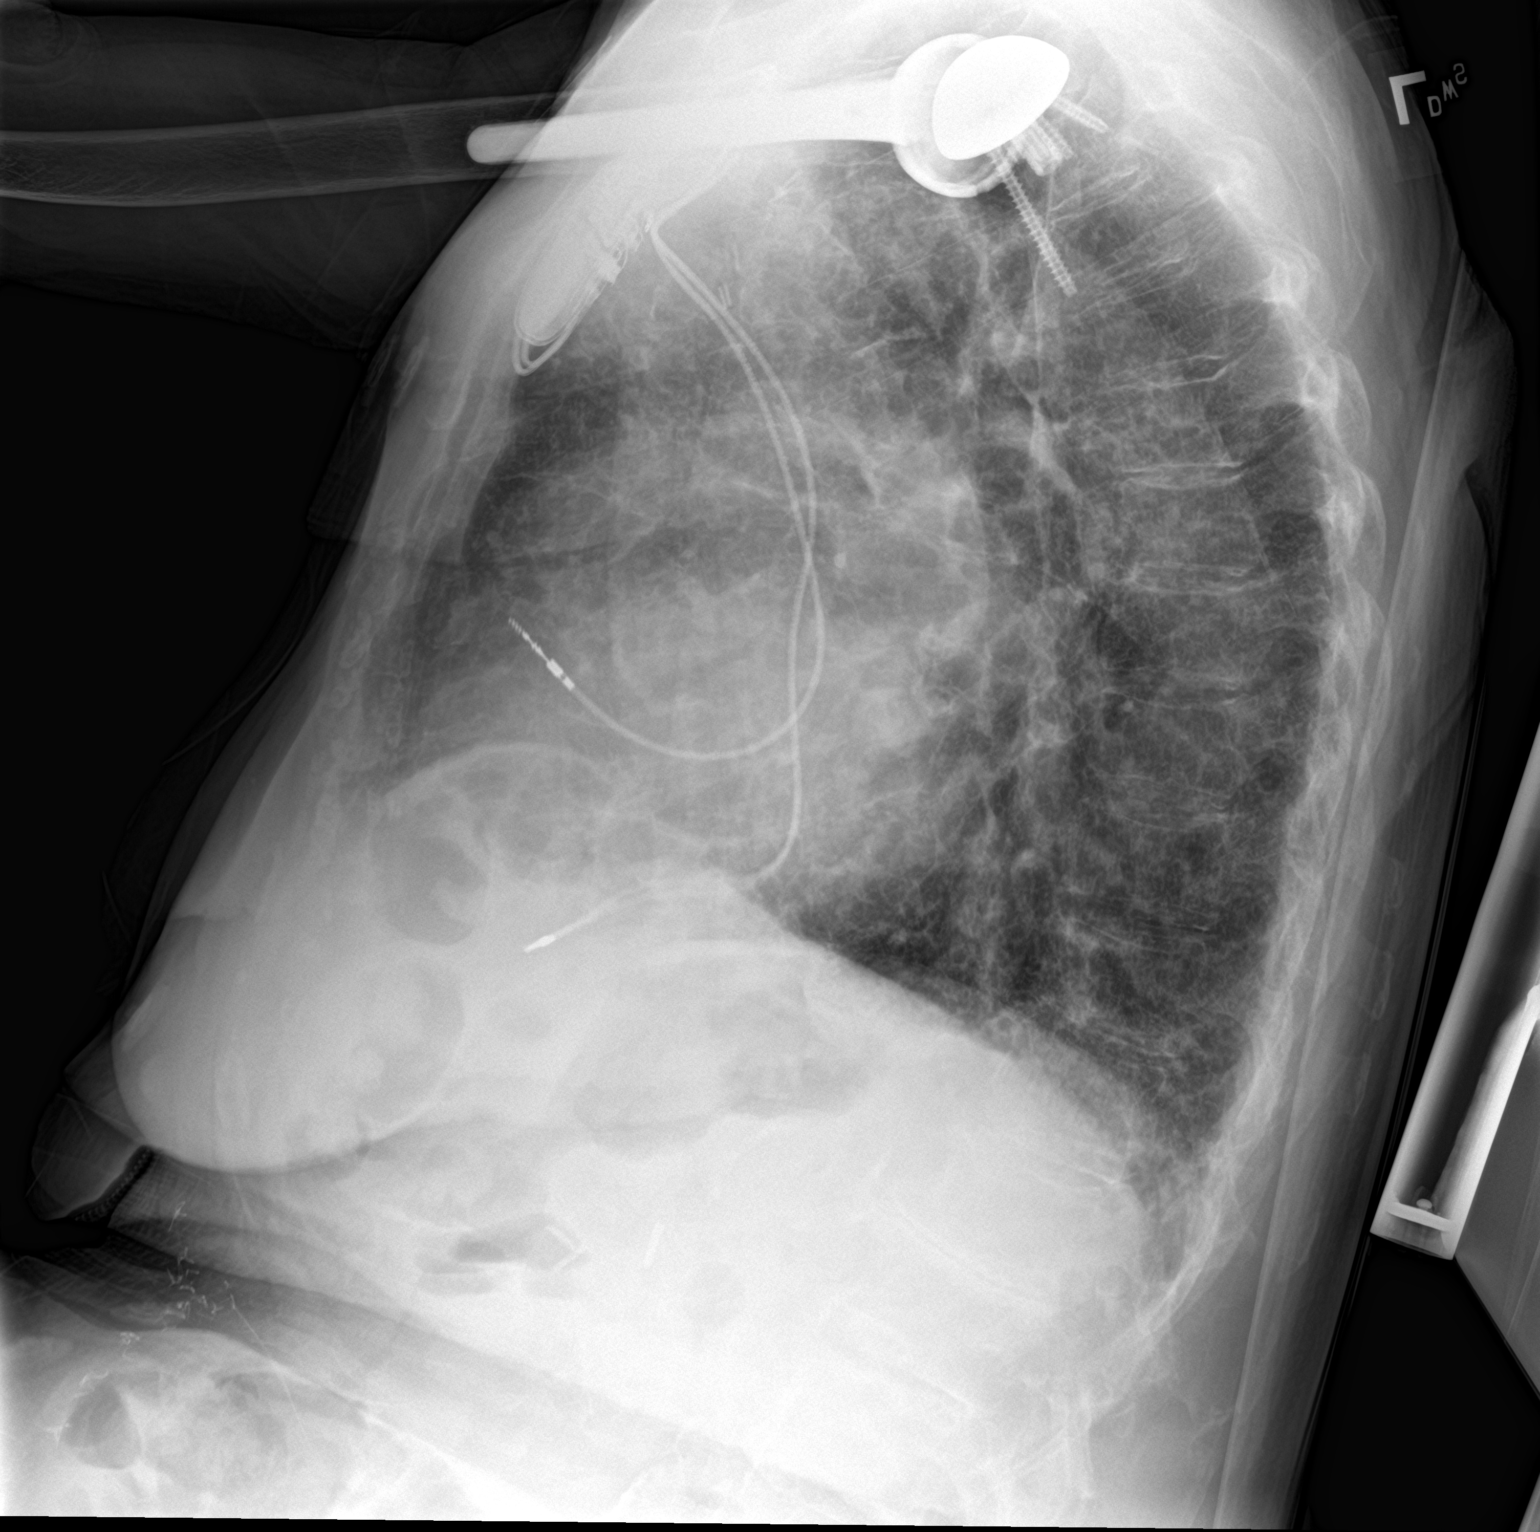

[2 of 2 positions shown; findings below may reference images not displayed]

FINDINGS: Diffuse emphysematous changes with reticulation at the lung bases.
New right perihilar and bibasilar interstitial thickening. No
pleural effusion or pneumothorax. Cardiomediastinal silhouette is
stable. Aortic calcifications. Left chest wall pacemaker with leads
overlying the right atrium and right ventricle. Bilateral total
shoulder arthroplasties. Surgical clips overlie the right axilla and
right upper abdomen.
IMPRESSION: 1. Interval development of right perihilar and bibasilar
interstitial thickening which could reflect pulmonary edema versus
infection in the appropriate clinical setting.
2. Aortic Atherosclerosis (ZW8CA-6YE.E) and Emphysema (ZW8CA-ESW.8).

## 2022-02-20 DEATH — deceased

## 2022-03-02 ENCOUNTER — Other Ambulatory Visit: Payer: Medicare HMO

## 2022-03-05 ENCOUNTER — Other Ambulatory Visit: Payer: Medicare HMO

## 2022-03-05 ENCOUNTER — Ambulatory Visit: Payer: Medicare HMO | Admitting: Internal Medicine

## 2022-03-15 ENCOUNTER — Encounter: Payer: Medicare HMO | Admitting: Pain Medicine

## 2022-03-22 ENCOUNTER — Ambulatory Visit: Payer: Self-pay

## 2022-07-26 ENCOUNTER — Encounter (INDEPENDENT_AMBULATORY_CARE_PROVIDER_SITE_OTHER): Payer: Medicare HMO

## 2022-07-26 ENCOUNTER — Ambulatory Visit (INDEPENDENT_AMBULATORY_CARE_PROVIDER_SITE_OTHER): Payer: Medicare HMO | Admitting: Vascular Surgery

## 2022-08-21 IMAGING — DX DG CHEST 1V PORT
1 series · 1 of 1 positions shown · non-contrast
Comparison: CTA chest dated 12/18/2021

CLINICAL DATA: Weakness, shortness of breath

EXAM:
PORTABLE CHEST 1 VIEW

[chest ap]
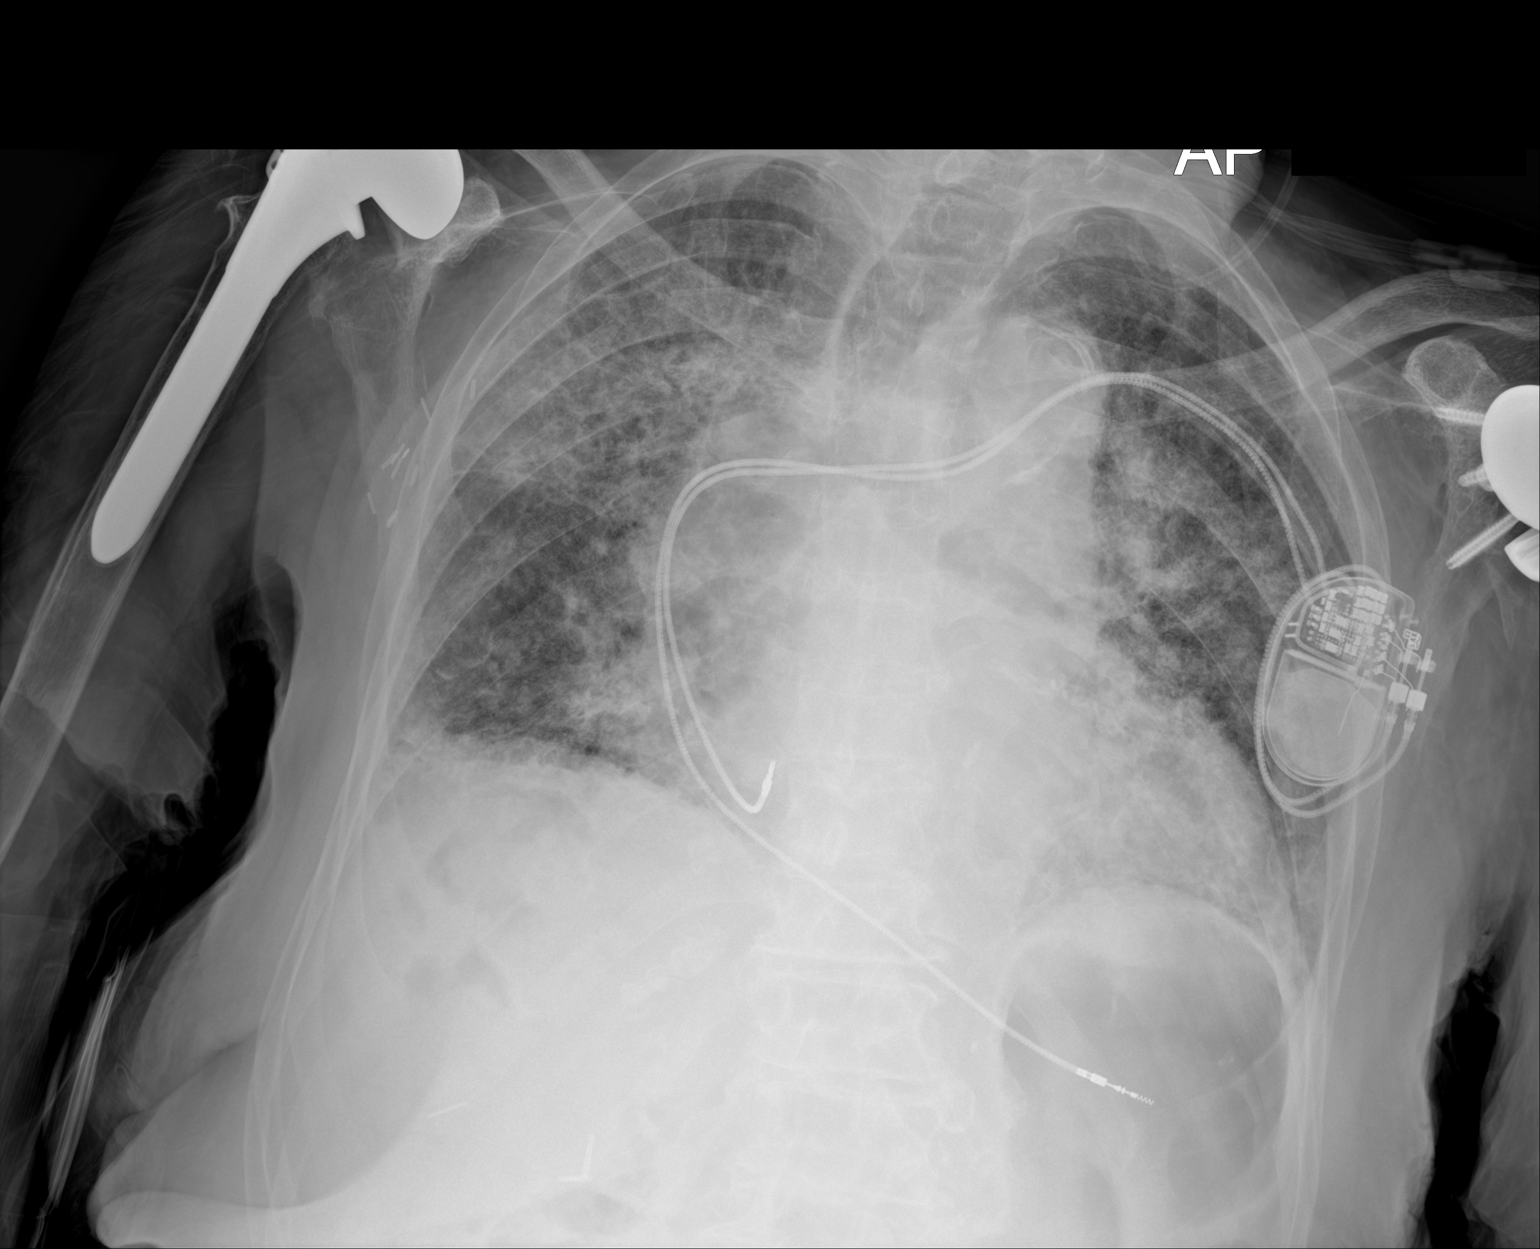

[1 of 1 positions shown; findings below may reference images not displayed]

FINDINGS: Multifocal patchy opacities, progressive when compared to the prior.
The patient has known right upper lobe tumor and multifocal
scarring. Given progression, superimposed pneumonia is not excluded,
although some degree of progressive tumor could account for the
progression in the right upper lobe. No pleural effusion or
pneumothorax.

Cardiomegaly. Left subclavian pacemaker. Thoracic aortic
atherosclerosis.

Bilateral shoulder arthroplasties.
IMPRESSION: Multifocal patchy opacities, progressive when compared to prior.
While some of this may reflect tumor in the right upper lobe,
superimposed multifocal pneumonia is suspected.

## 2022-08-22 IMAGING — US US EXTREM LOW VENOUS
1 series · 14 of 24 positions shown · non-contrast
Comparison: 09/18/2021

CLINICAL DATA: History of deep venous thrombosis, history of lung
and breast cancer

EXAM:
BILATERAL LOWER EXTREMITY VENOUS DOPPLER ULTRASOUND
TECHNIQUE: Gray-scale sonography with compression, as well as color and duplex
ultrasound, were performed to evaluate the deep venous system(s)
from the level of the common femoral vein through the popliteal and
proximal calf veins.

[Series 1: us venous img lower bilat (dvt) · portal-venous · 14 of 56 slices shown]
[im 1/56]
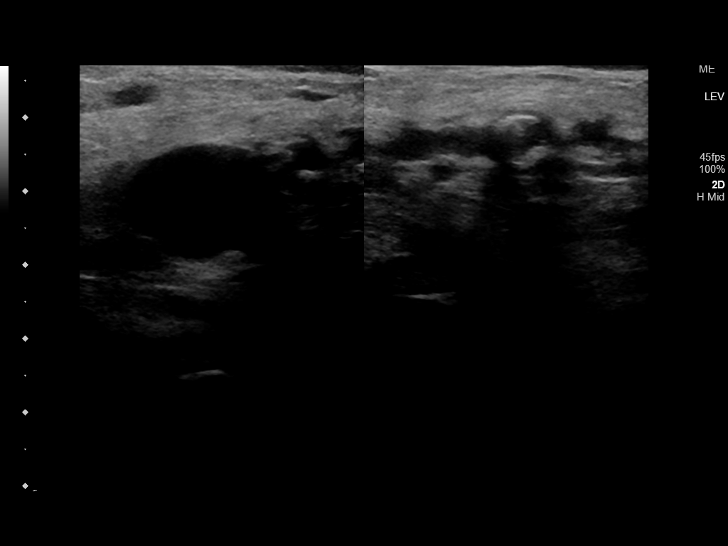
[im 5/56]
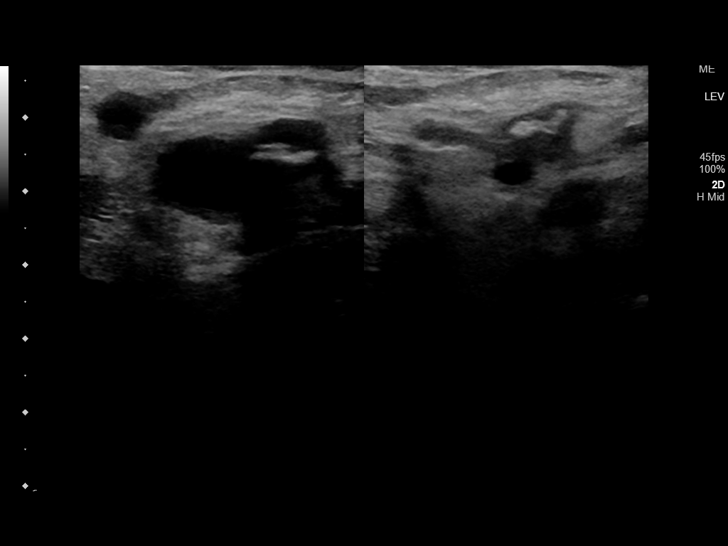
[im 10/56]
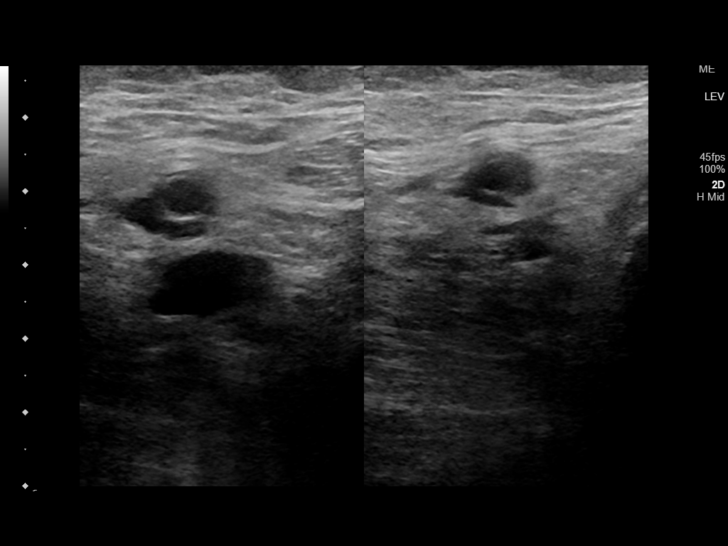
[im 15/56]
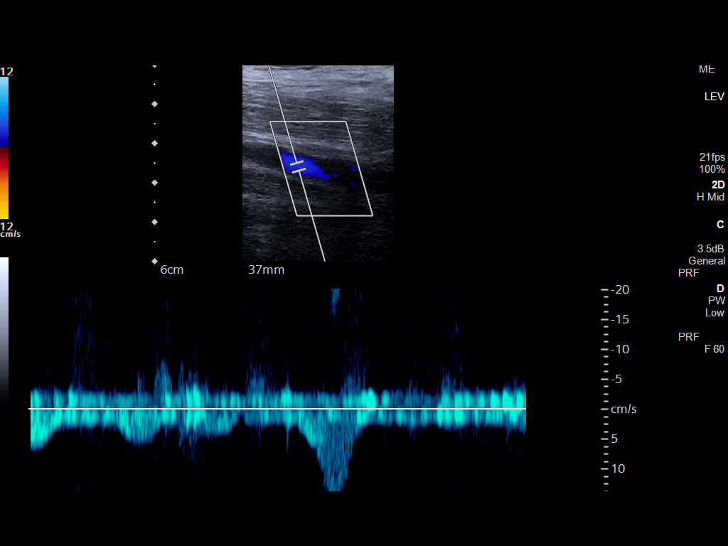
[im 17/56]
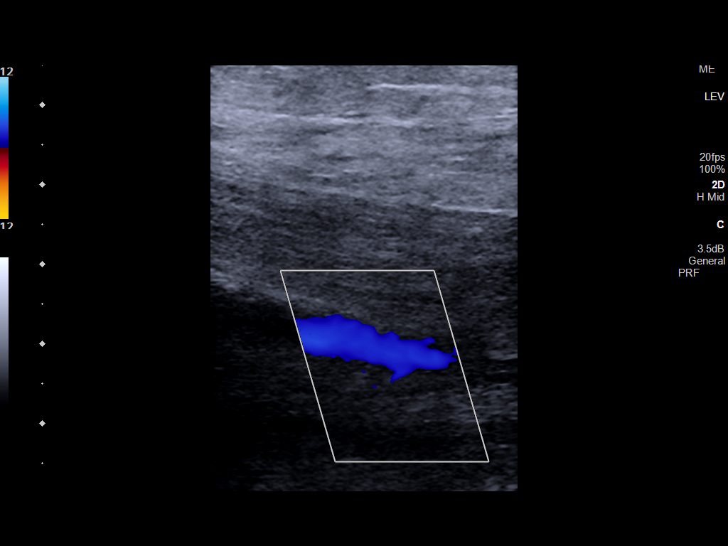
[im 22/56]
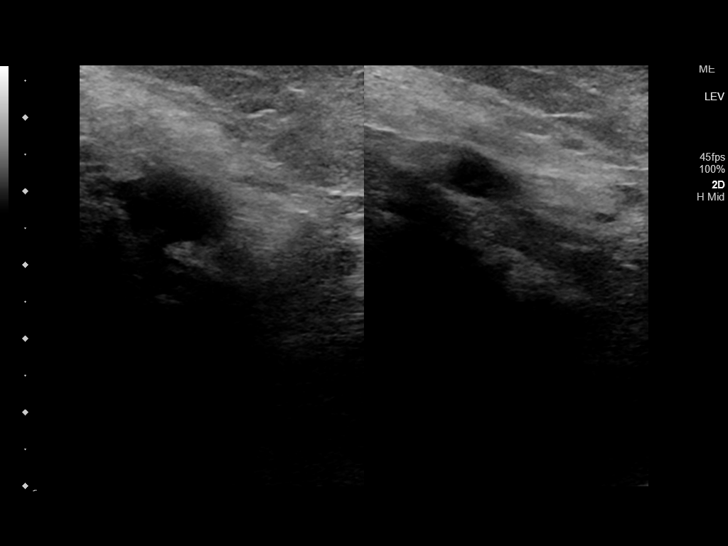
[im 27/56]
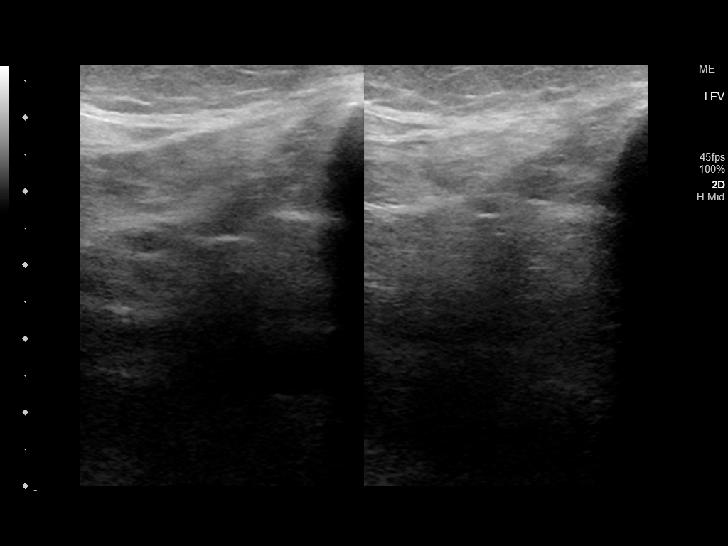
[im 29/56]
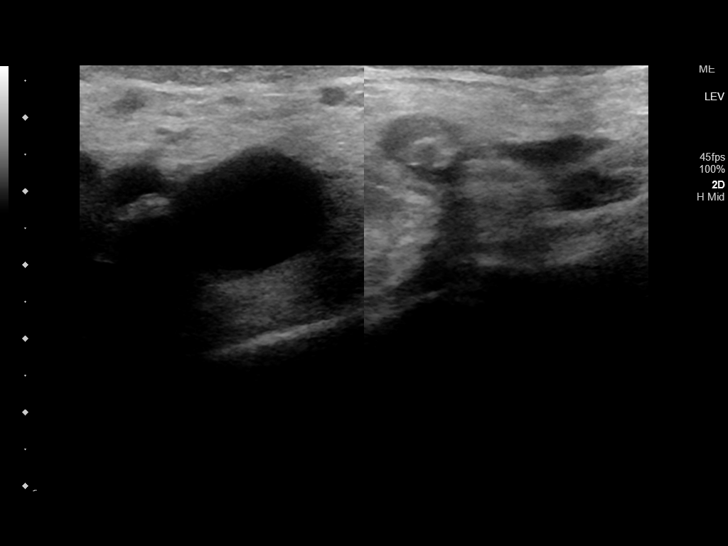
[im 34/56]
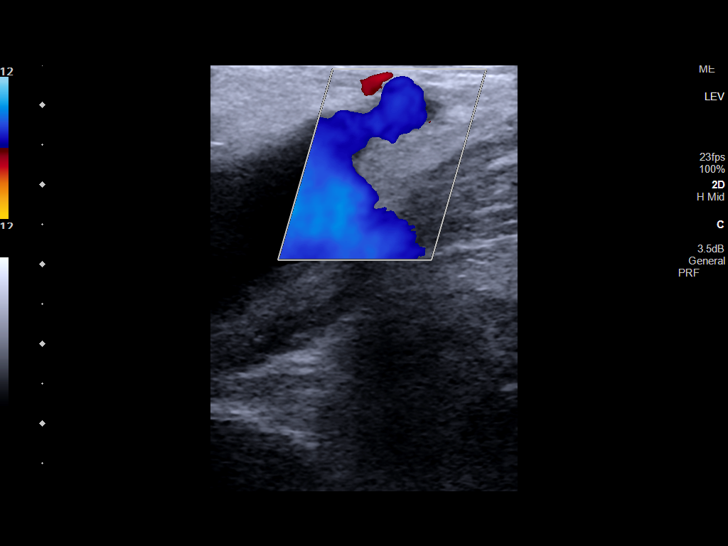
[im 39/56]
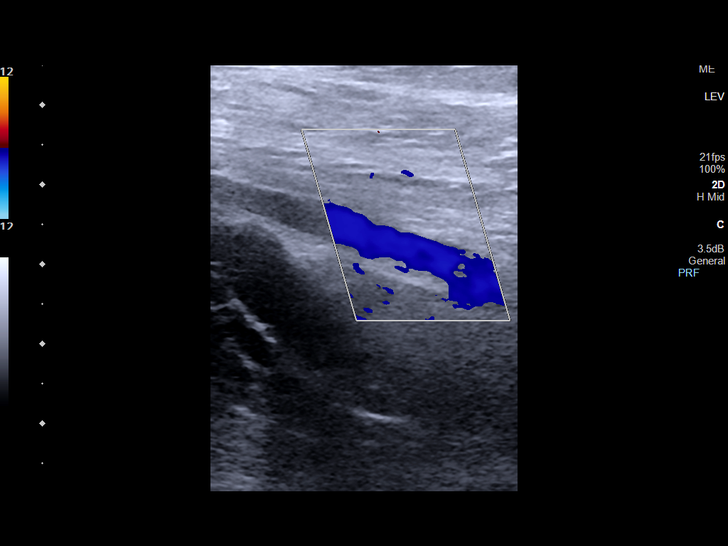
[im 44/56]
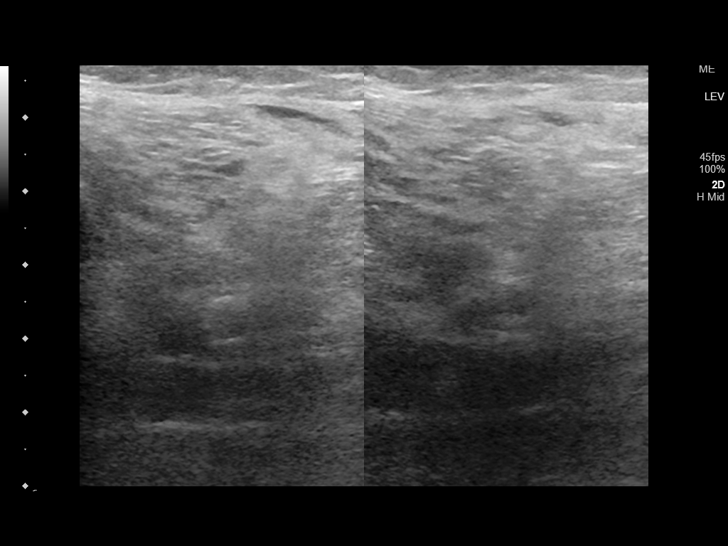
[im 46/56]
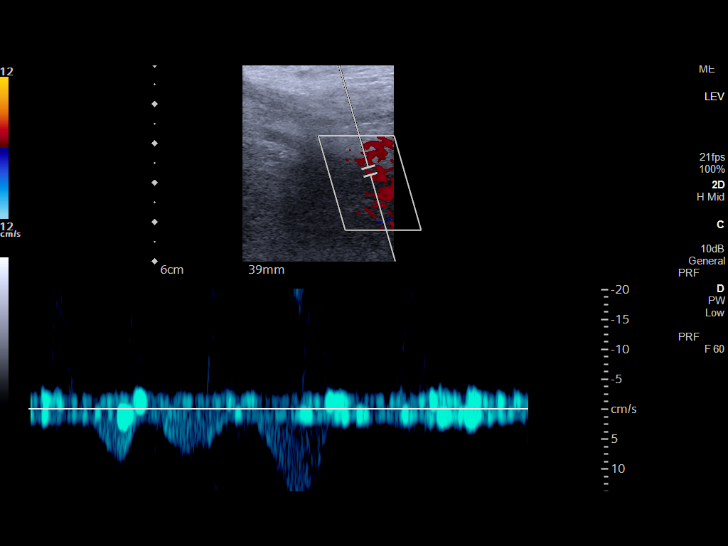
[im 51/56]
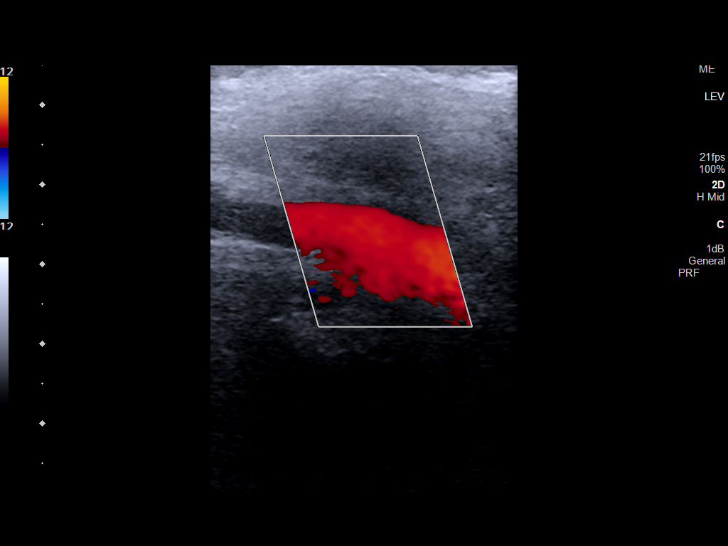
[im 56/56]
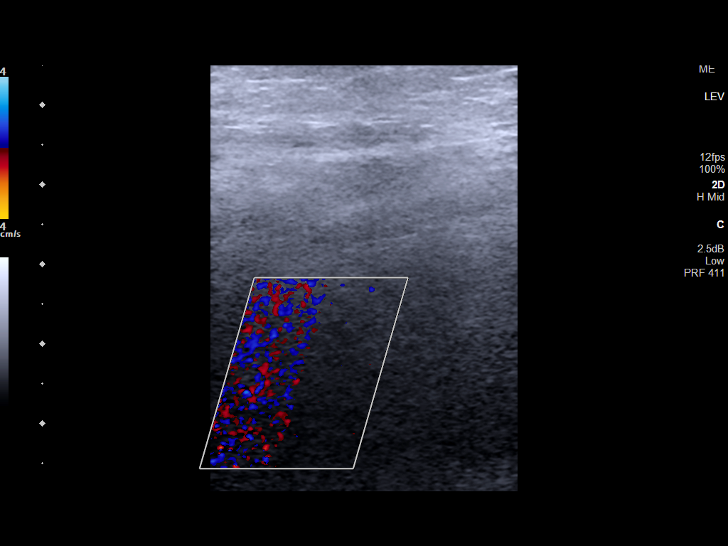

[14 of 24 positions shown; findings below may reference images not displayed]

FINDINGS: VENOUS

Normal compressibility of the common femoral, superficial femoral,
and popliteal veins, as well as the visualized calf veins.
Visualized portions of profunda femoral vein and great saphenous
vein unremarkable. No filling defects to suggest DVT on grayscale or
color Doppler imaging. Doppler waveforms show normal direction of
venous flow, normal respiratory plasticity and response to
augmentation.

OTHER

None.

Limitations: none
IMPRESSION: 1. No evidence of deep venous thrombosis within either lower
extremity.

## 2022-08-22 IMAGING — CT CT HEAD W/O CM
4 series · 17 of 47 positions shown, 19 images · non-contrast
Comparison: 12/18/2021

CLINICAL DATA: Mental status change with unknown cause



[Series 2: head wo · axial · 0.43mm/px · z∈[-148,-28]mm · 7 of 34 slices shown, 9 images]
[im 5/34  brain]
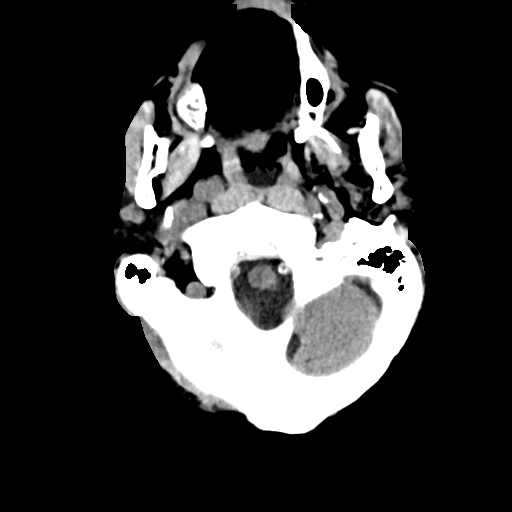
[im 5/34  bone]
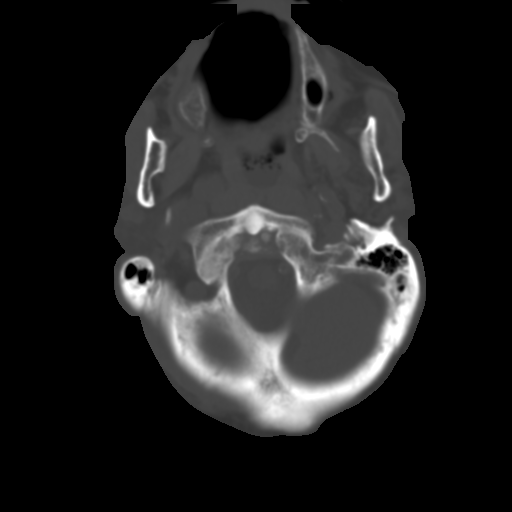
[im 9/34  brain]
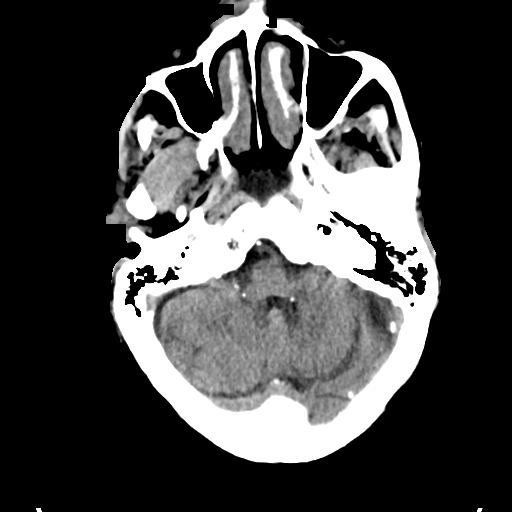
[im 13/34  brain]
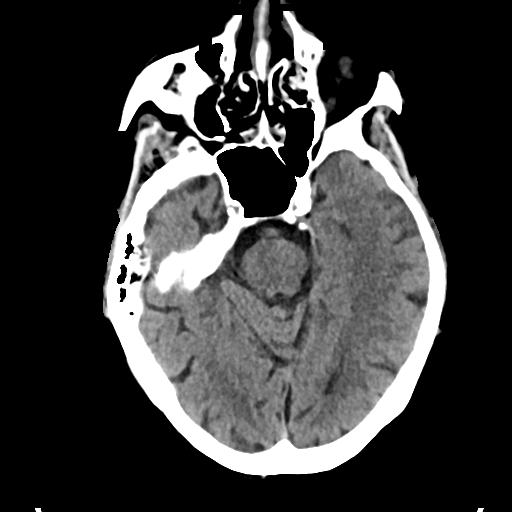
[im 17/34  brain]
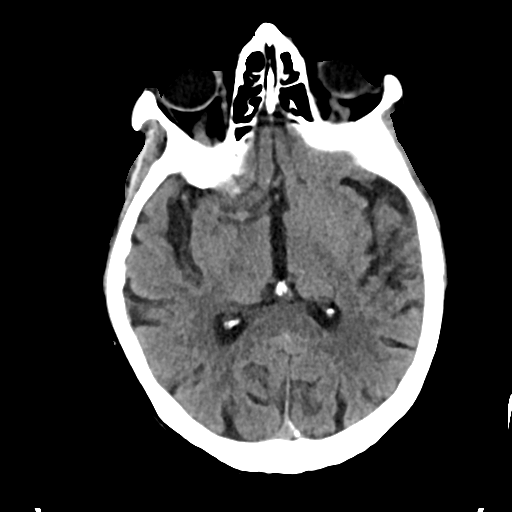
[im 21/34  brain]
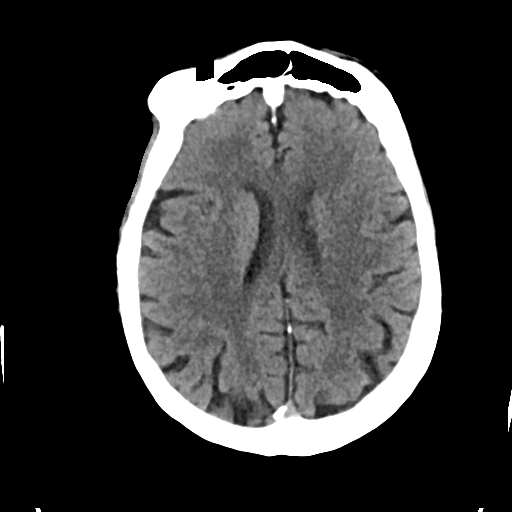
[im 21/34  bone]
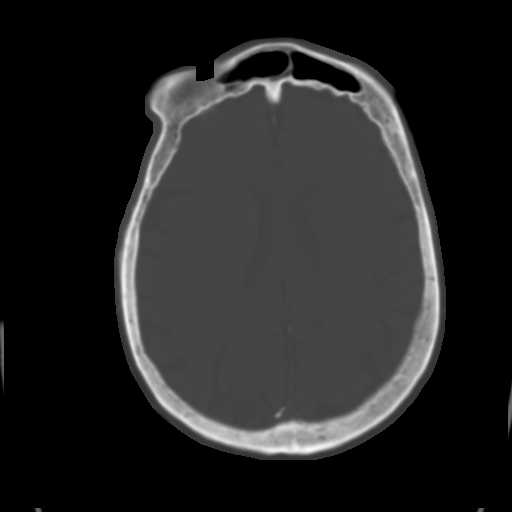
[im 25/34  brain]
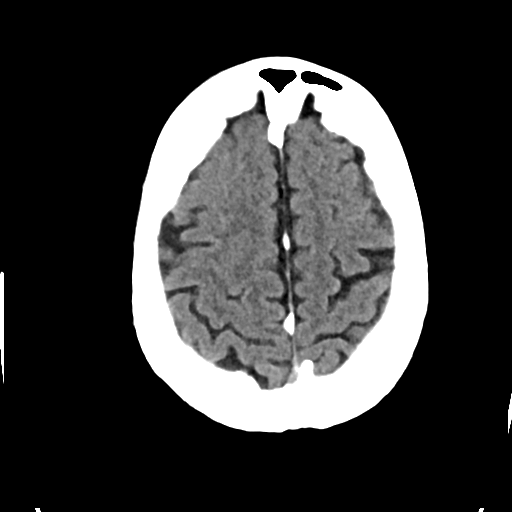
[im 29/34  brain]
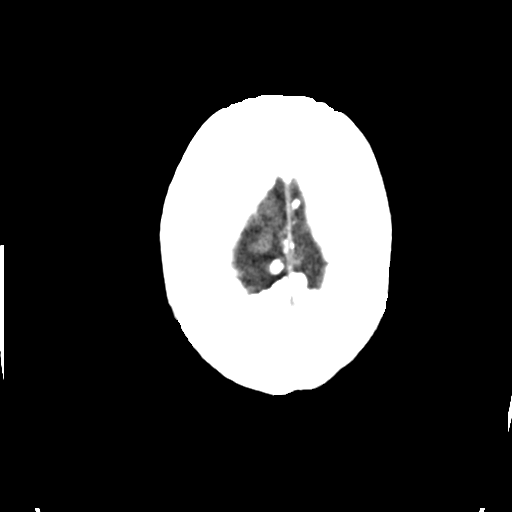

[Series 3: head bone · axial · 0.43mm/px · z∈[-152,-94]mm · 4 of 84 slices shown]
[im 9/84  bone]
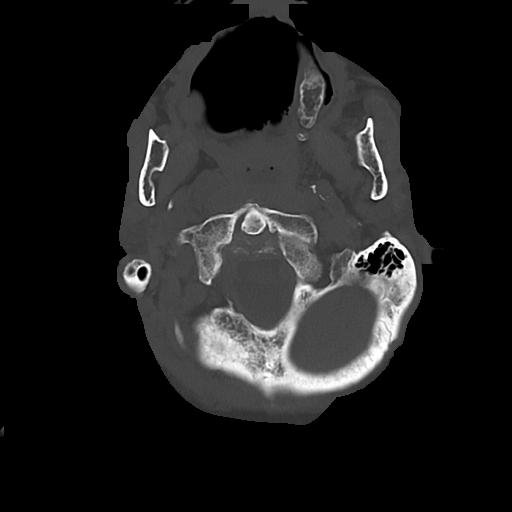
[im 17/84  bone]
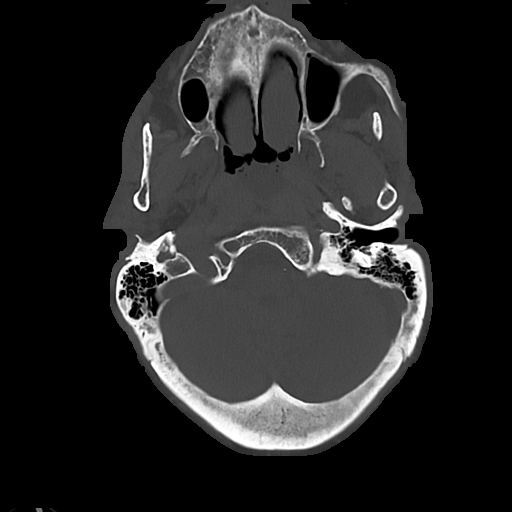
[im 25/84  bone]
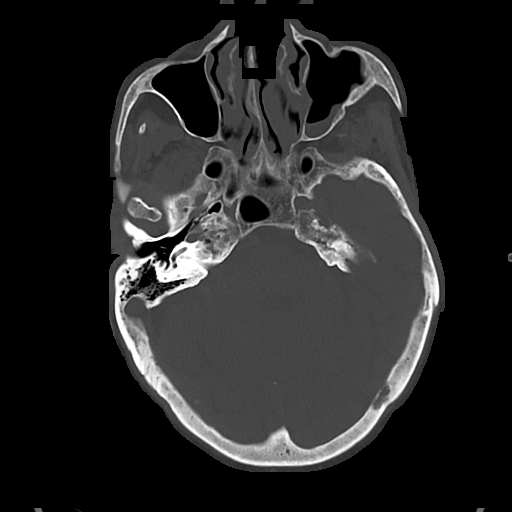
[im 38/84  bone]
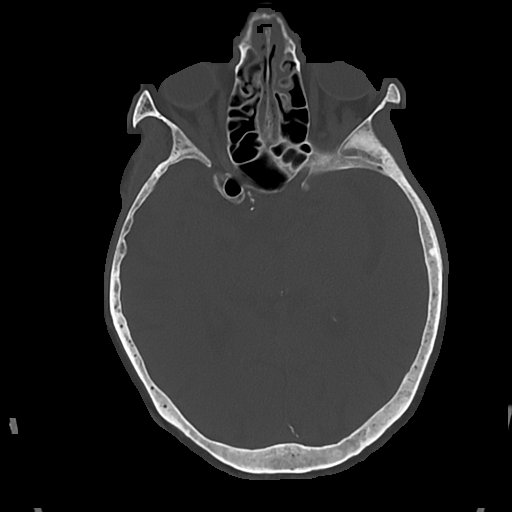

[Series 4: cor soft · coronal · 0.35mm/px · 3 of 75 slices shown]
[im 25/75  brain]
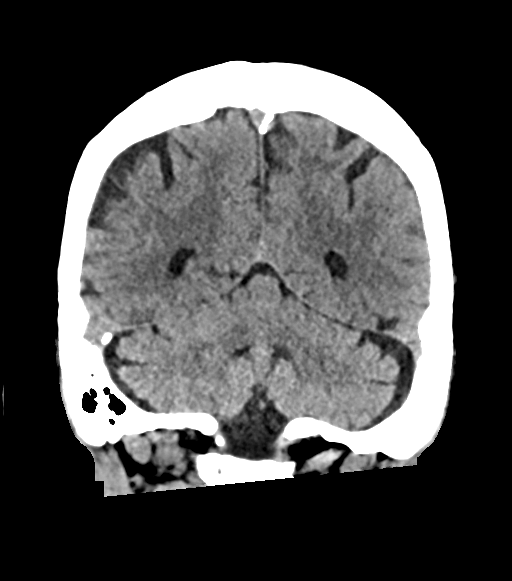
[im 33/75  brain]
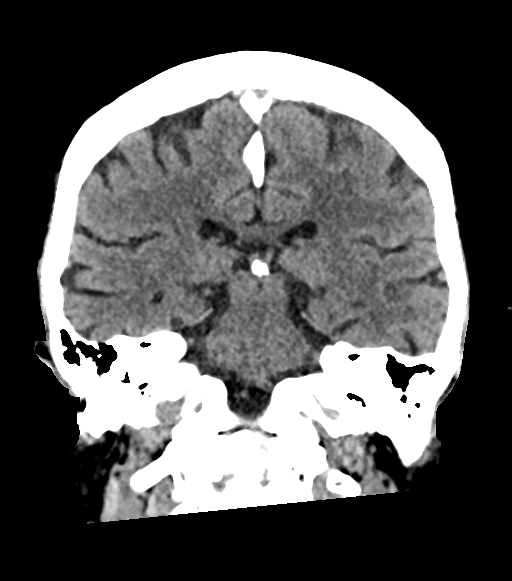
[im 42/75  brain]
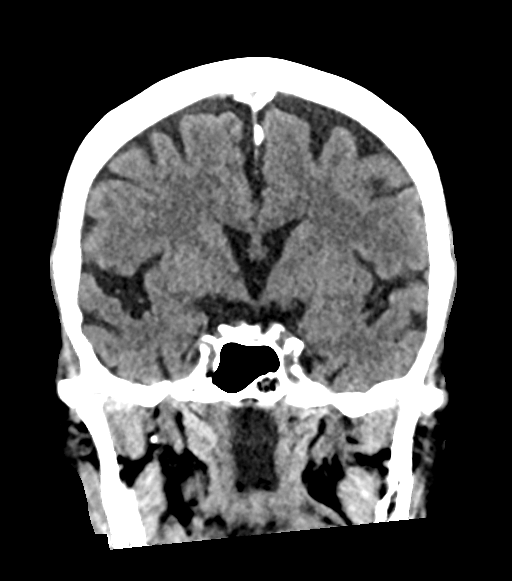

[Series 5: sag soft · sagittal · 0.42mm/px · 3 of 56 slices shown]
[im 19/56  brain]
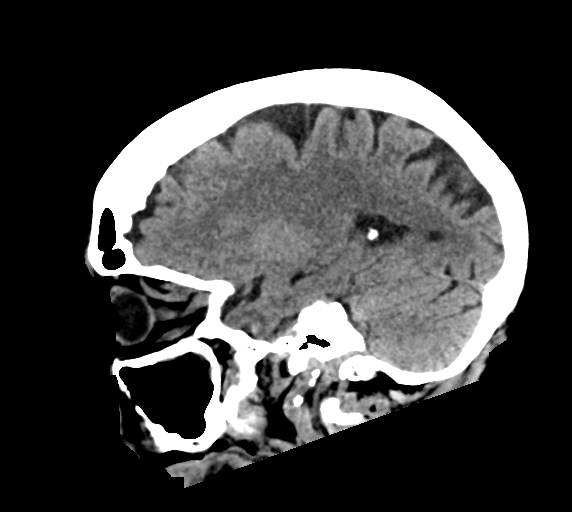
[im 28/56  brain]
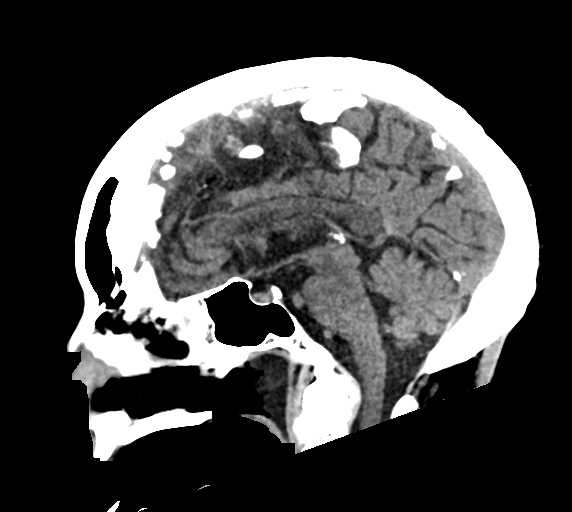
[im 37/56  brain]
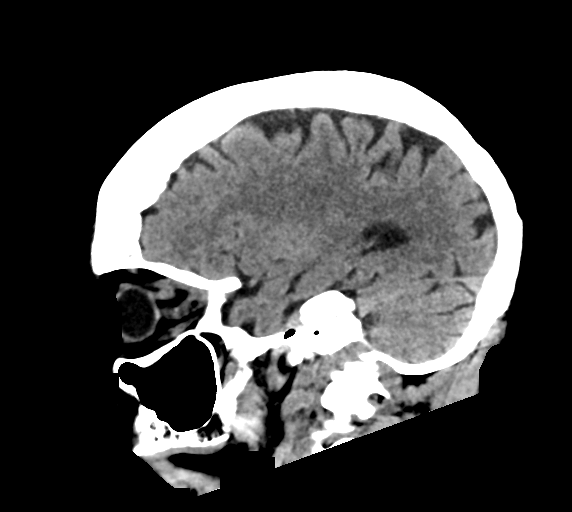

[17 of 47 positions shown; findings below may reference images not displayed]

FINDINGS: Brain: No evidence of acute infarction, hemorrhage, hydrocephalus,
extra-axial collection or mass lesion/mass effect. Age expected
brain volume. Mild chronic small vessel ischemic change in the
hemispheric white matter

Vascular: No hyperdense vessel or unexpected calcification.

Skull: Retro dental ligamentous thickening contacting the
cervicomedullary junction without high-grade foramen magnum
stenosis.

Sinuses/Orbits: No acute finding.  Bilateral cataract resection
IMPRESSION: No acute finding.  Unremarkable appearance of the brain for age
